# Patient Record
Sex: Male | Born: 1950 | Race: White | Hispanic: No | Marital: Married
Health system: Midwestern US, Community
[De-identification: ages and names within clinical notes are randomized; demographics above are authoritative.]

## PROBLEM LIST (undated history)

## (undated) DIAGNOSIS — M1990 Unspecified osteoarthritis, unspecified site: Secondary | ICD-10-CM

## (undated) DIAGNOSIS — Z9889 Other specified postprocedural states: Secondary | ICD-10-CM

## (undated) DIAGNOSIS — E785 Hyperlipidemia, unspecified: Secondary | ICD-10-CM

## (undated) DIAGNOSIS — K219 Gastro-esophageal reflux disease without esophagitis: Secondary | ICD-10-CM

## (undated) DIAGNOSIS — M25471 Effusion, right ankle: Secondary | ICD-10-CM

## (undated) DIAGNOSIS — J42 Unspecified chronic bronchitis: Secondary | ICD-10-CM

## (undated) DIAGNOSIS — I639 Cerebral infarction, unspecified: Secondary | ICD-10-CM

## (undated) DIAGNOSIS — E119 Type 2 diabetes mellitus without complications: Secondary | ICD-10-CM

## (undated) DIAGNOSIS — G2581 Restless legs syndrome: Secondary | ICD-10-CM

## (undated) DIAGNOSIS — R5381 Other malaise: Secondary | ICD-10-CM

## (undated) DIAGNOSIS — D509 Iron deficiency anemia, unspecified: Secondary | ICD-10-CM

## (undated) DIAGNOSIS — M12819 Other specific arthropathies, not elsewhere classified, unspecified shoulder: Secondary | ICD-10-CM

## (undated) DIAGNOSIS — I10 Essential (primary) hypertension: Secondary | ICD-10-CM

## (undated) DIAGNOSIS — I200 Unstable angina: Secondary | ICD-10-CM

## (undated) DIAGNOSIS — R0600 Dyspnea, unspecified: Secondary | ICD-10-CM

## (undated) DIAGNOSIS — Z794 Long term (current) use of insulin: Secondary | ICD-10-CM

## (undated) DIAGNOSIS — R011 Cardiac murmur, unspecified: Secondary | ICD-10-CM

## (undated) DIAGNOSIS — IMO0001 Reserved for inherently not codable concepts without codable children: Secondary | ICD-10-CM

## (undated) DIAGNOSIS — G44209 Tension-type headache, unspecified, not intractable: Secondary | ICD-10-CM

## (undated) DIAGNOSIS — R112 Nausea with vomiting, unspecified: Secondary | ICD-10-CM

## (undated) DIAGNOSIS — R5383 Other fatigue: Secondary | ICD-10-CM

## (undated) DIAGNOSIS — Z8489 Family history of other specified conditions: Secondary | ICD-10-CM

## (undated) DIAGNOSIS — K2971 Gastritis, unspecified, with bleeding: Secondary | ICD-10-CM

## (undated) DIAGNOSIS — E162 Hypoglycemia, unspecified: Secondary | ICD-10-CM

## (undated) DIAGNOSIS — I2510 Atherosclerotic heart disease of native coronary artery without angina pectoris: Secondary | ICD-10-CM

## (undated) DIAGNOSIS — K2270 Barrett's esophagus without dysplasia: Secondary | ICD-10-CM

## (undated) DIAGNOSIS — R413 Other amnesia: Secondary | ICD-10-CM

## (undated) DIAGNOSIS — T8859XA Other complications of anesthesia, initial encounter: Secondary | ICD-10-CM

## (undated) DIAGNOSIS — M25475 Effusion, left foot: Secondary | ICD-10-CM

## (undated) DIAGNOSIS — M25474 Effusion, right foot: Secondary | ICD-10-CM

## (undated) DIAGNOSIS — N529 Male erectile dysfunction, unspecified: Secondary | ICD-10-CM

## (undated) DIAGNOSIS — M542 Cervicalgia: Secondary | ICD-10-CM

## (undated) DIAGNOSIS — T4145XA Adverse effect of unspecified anesthetic, initial encounter: Secondary | ICD-10-CM

## (undated) DIAGNOSIS — M25472 Effusion, left ankle: Secondary | ICD-10-CM

## (undated) DIAGNOSIS — K2991 Gastroduodenitis, unspecified, with bleeding: Secondary | ICD-10-CM

## (undated) HISTORY — DX: Other malaise: R53.83

## (undated) HISTORY — DX: Other malaise: R53.81

## (undated) HISTORY — PX: UMBILICAL HERNIA REPAIR: SHX196

## (undated) HISTORY — PX: KNEE SURGERY: SHX244

## (undated) HISTORY — DX: Other specific arthropathies, not elsewhere classified, unspecified shoulder: M12.819

## (undated) HISTORY — DX: Unstable angina: I20.0

## (undated) HISTORY — DX: Barrett's esophagus without dysplasia: K22.70

## (undated) HISTORY — PX: ROTATOR CUFF REPAIR: SHX139

## (undated) HISTORY — DX: Tension-type headache, unspecified, not intractable: G44.209

## (undated) HISTORY — PX: MOUTH SURGERY: SHX715

## (undated) HISTORY — DX: Hyperlipidemia, unspecified: E78.5

## (undated) HISTORY — PX: CORONARY ANGIOPLASTY WITH STENT PLACEMENT: SHX49

## (undated) HISTORY — DX: Cervicalgia: M54.2

## (undated) HISTORY — DX: Male erectile dysfunction, unspecified: N52.9

## (undated) HISTORY — DX: Gastroduodenitis, unspecified, with bleeding: K29.91

## (undated) HISTORY — DX: Hypoglycemia, unspecified: E16.2

## (undated) HISTORY — DX: Essential (primary) hypertension: I10

## (undated) HISTORY — DX: Gastritis, unspecified, with bleeding: K29.71

---

## 1998-10-30 ENCOUNTER — Ambulatory Visit (HOSPITAL_BASED_OUTPATIENT_CLINIC_OR_DEPARTMENT_OTHER): Admission: RE | Admit: 1998-10-30 | Discharge: 1998-10-30 | Payer: Self-pay | Admitting: Orthopedic Surgery

## 1999-06-14 ENCOUNTER — Emergency Department (HOSPITAL_COMMUNITY): Admission: EM | Admit: 1999-06-14 | Discharge: 1999-06-14 | Payer: Self-pay | Admitting: Emergency Medicine

## 1999-06-21 ENCOUNTER — Emergency Department (HOSPITAL_COMMUNITY): Admission: EM | Admit: 1999-06-21 | Discharge: 1999-06-21 | Payer: Self-pay | Admitting: Emergency Medicine

## 2007-05-03 HISTORY — PX: INCISION AND DRAINAGE ABSCESS: SHX5864

## 2007-06-01 ENCOUNTER — Emergency Department (HOSPITAL_COMMUNITY): Admission: EM | Admit: 2007-06-01 | Discharge: 2007-06-01 | Payer: Self-pay | Admitting: Emergency Medicine

## 2007-06-02 ENCOUNTER — Ambulatory Visit (HOSPITAL_COMMUNITY): Admission: RE | Admit: 2007-06-02 | Discharge: 2007-06-03 | Payer: Self-pay | Admitting: Urology

## 2008-03-04 HISTORY — PX: HERNIA REPAIR: SHX51

## 2008-04-17 ENCOUNTER — Emergency Department (HOSPITAL_COMMUNITY): Admission: EM | Admit: 2008-04-17 | Discharge: 2008-04-17 | Payer: Self-pay | Admitting: Emergency Medicine

## 2008-05-03 ENCOUNTER — Ambulatory Visit (HOSPITAL_COMMUNITY): Admission: RE | Admit: 2008-05-03 | Discharge: 2008-05-03 | Payer: Self-pay | Admitting: *Deleted

## 2008-05-03 ENCOUNTER — Encounter (INDEPENDENT_AMBULATORY_CARE_PROVIDER_SITE_OTHER): Payer: Self-pay | Admitting: *Deleted

## 2008-06-29 ENCOUNTER — Ambulatory Visit (HOSPITAL_COMMUNITY): Admission: RE | Admit: 2008-06-29 | Discharge: 2008-06-29 | Payer: Self-pay | Admitting: *Deleted

## 2008-07-21 ENCOUNTER — Emergency Department (HOSPITAL_COMMUNITY): Admission: EM | Admit: 2008-07-21 | Discharge: 2008-07-21 | Payer: Self-pay | Admitting: Emergency Medicine

## 2009-07-10 ENCOUNTER — Encounter: Payer: Self-pay | Admitting: Internal Medicine

## 2010-01-11 ENCOUNTER — Ambulatory Visit (HOSPITAL_COMMUNITY): Admission: RE | Admit: 2010-01-11 | Discharge: 2010-01-11 | Payer: Self-pay | Admitting: General Surgery

## 2010-02-22 ENCOUNTER — Emergency Department (HOSPITAL_COMMUNITY)
Admission: EM | Admit: 2010-02-22 | Discharge: 2010-02-22 | Payer: Self-pay | Source: Home / Self Care | Admitting: Emergency Medicine

## 2010-05-14 LAB — CBC
HCT: 44 % (ref 39.0–52.0)
MCH: 30.7 pg (ref 26.0–34.0)
MCV: 85.4 fL (ref 78.0–100.0)
RBC: 5.15 MIL/uL (ref 4.22–5.81)
WBC: 6.6 10*3/uL (ref 4.0–10.5)

## 2010-05-14 LAB — COMPREHENSIVE METABOLIC PANEL
Alkaline Phosphatase: 48 U/L (ref 39–117)
BUN: 16 mg/dL (ref 6–23)
Chloride: 105 mEq/L (ref 96–112)
Creatinine, Ser: 1.07 mg/dL (ref 0.4–1.5)
GFR calc non Af Amer: >60 mL/min (ref >60)
Glucose, Bld: 152 mg/dL — ABNORMAL HIGH (ref 70–99)
Potassium: 3.9 mEq/L (ref 3.5–5.1)
Total Bilirubin: 1.3 mg/dL — ABNORMAL HIGH (ref 0.3–1.2)

## 2010-05-14 LAB — LIPASE, BLOOD: Lipase: 36 U/L (ref 11–59)

## 2010-05-14 LAB — DIFFERENTIAL
Basophils Absolute: 0 10*3/uL (ref 0.0–0.1)
Basophils Relative: 0 % (ref 0–1)
Neutro Abs: 4.8 10*3/uL (ref 1.7–7.7)
Neutrophils Relative %: 72 % (ref 43–77)

## 2010-05-15 LAB — GLUCOSE, CAPILLARY: Glucose-Capillary: 98 mg/dL (ref 70–99)

## 2010-05-15 LAB — COMPREHENSIVE METABOLIC PANEL
ALT: 43 U/L (ref 0–53)
AST: 32 U/L (ref 0–37)
Albumin: 3.9 g/dL (ref 3.5–5.2)
Calcium: 9.7 mg/dL (ref 8.4–10.5)
GFR calc Af Amer: >60 mL/min (ref >60)
Glucose, Bld: 148 mg/dL — ABNORMAL HIGH (ref 70–99)
Sodium: 141 mEq/L (ref 135–145)
Total Protein: 7 g/dL (ref 6.0–8.3)

## 2010-05-15 LAB — SURGICAL PCR SCREEN: Staphylococcus aureus: NEGATIVE

## 2010-06-19 LAB — POCT I-STAT, CHEM 8
BUN: 16 mg/dL (ref 6–23)
BUN: 23 mg/dL (ref 6–23)
Calcium, Ion: 0.96 mmol/L — ABNORMAL LOW (ref 1.12–1.32)
Calcium, Ion: 1.13 mmol/L (ref 1.12–1.32)
Creatinine, Ser: 1.1 mg/dL (ref 0.4–1.5)
Creatinine, Ser: 1.2 mg/dL (ref 0.4–1.5)
Glucose, Bld: 163 mg/dL — ABNORMAL HIGH (ref 70–99)
TCO2: 25 mmol/L (ref 0–100)
TCO2: 25 mmol/L (ref 0–100)

## 2010-06-19 LAB — LACTIC ACID, PLASMA: Lactic Acid, Venous: 3.9 mmol/L — ABNORMAL HIGH (ref 0.5–2.2)

## 2010-06-19 LAB — TYPE AND SCREEN
ABO/RH(D): O POS
Antibody Screen: NEGATIVE

## 2010-06-19 LAB — COMPREHENSIVE METABOLIC PANEL
ALT: 37 U/L (ref 0–53)
BUN: 15 mg/dL (ref 6–23)
CO2: 26 mEq/L (ref 19–32)
Calcium: 8.6 mg/dL (ref 8.4–10.5)
Creatinine, Ser: 1.07 mg/dL (ref 0.4–1.5)
GFR calc non Af Amer: >60 mL/min (ref >60)
Glucose, Bld: 161 mg/dL — ABNORMAL HIGH (ref 70–99)

## 2010-06-19 LAB — HEPATIC FUNCTION PANEL
Albumin: 4.1 g/dL (ref 3.5–5.2)
Alkaline Phosphatase: 51 U/L (ref 39–117)
Indirect Bilirubin: 1 mg/dL — ABNORMAL HIGH (ref 0.3–0.9)
Total Protein: 7 g/dL (ref 6.0–8.3)

## 2010-06-19 LAB — URINE MICROSCOPIC-ADD ON

## 2010-06-19 LAB — DIFFERENTIAL
Basophils Absolute: 0 10*3/uL (ref 0.0–0.1)
Eosinophils Relative: 1 % (ref 0–5)
Lymphocytes Relative: 28 % (ref 12–46)
Monocytes Absolute: 0.3 10*3/uL (ref 0.1–1.0)
Monocytes Relative: 6 % (ref 3–12)
Neutro Abs: 3.3 10*3/uL (ref 1.7–7.7)

## 2010-06-19 LAB — URINALYSIS, ROUTINE W REFLEX MICROSCOPIC
Bilirubin Urine: NEGATIVE
Glucose, UA: NEGATIVE mg/dL
Hgb urine dipstick: NEGATIVE
Ketones, ur: NEGATIVE mg/dL
Specific Gravity, Urine: 1.019 (ref 1.005–1.030)
pH: 8.5 — ABNORMAL HIGH (ref 5.0–8.0)

## 2010-06-19 LAB — LIPASE, BLOOD: Lipase: 33 U/L (ref 11–59)

## 2010-06-19 LAB — CBC
HCT: 46.7 % (ref 39.0–52.0)
Hemoglobin: 16.8 g/dL (ref 13.0–17.0)
MCHC: 35.9 g/dL (ref 30.0–36.0)
RDW: 12.5 % (ref 11.5–15.5)
WBC: 5.2 10*3/uL (ref 4.0–10.5)

## 2010-06-19 LAB — APTT: aPTT: 25 seconds (ref 24–37)

## 2010-06-19 LAB — D-DIMER, QUANTITATIVE: D-Dimer, Quant: 0.42 ug/mL-FEU (ref 0.00–0.48)

## 2010-06-19 LAB — POCT CARDIAC MARKERS: CKMB, poc: <1 ng/mL — ABNORMAL LOW (ref 1.0–8.0)

## 2010-06-19 LAB — PROTIME-INR
INR: 1 (ref 0.00–1.49)
Prothrombin Time: 13.1 seconds (ref 11.6–15.2)

## 2010-07-17 NOTE — H&P (Signed)
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  He went to the ER yesterday  where an attempt was made to lance the left groin abscess and very  little pus was obtained.  He was put on doxycycline but he ran a fever  over 102 last night and the pain and swelling have continued and he is  having progressive cellulitis of his left flank.  He is admitted now for  I and D of this lesion in the OR and to be put on IV antibiotics.  He  never had this before.  It started out as a small pimple and it never  did drain.   PAST MEDICAL HISTORY:  He has had only knee surgery in 2000 as his only  operation.  He does not smoke or drink alcohol.  He has been in good general health.  Has a little acid reflux as his  only medical problem.  No heart or lung problems. A 12 point review of  systems otherwise negative.  He was adopted so does not know his family  history.   FAMILY HISTORY:  He has two sons, age 40 and 31 who still live at home,  both unmarried.   MEDICATIONS:  1. Doxycycline 100 mg twice a day.  2. Oxycodone for pain.   ALLERGIES:  HE HAS NO KNOWN ALLERGIES.   PHYSICAL EXAMINATION:  Temperature is 97.3, pulse 74, respirations 18,  blood pressure 120/83.  He is a pleasant, white middle-aged male in no  acute distress.  HEENT:  Clear.  NECK:  Supple.  ABDOMEN:  Soft, but he does have a fluctuant area in the left inguinal  area with cellulitis that now goes all the way up to into his left  flank.  The mark from the cellulitis yesterday was about half as far.  It has spread at least to 12-15 cm from where it was yesterday.  There  was a little area of scab overlying where it was attempted to be lanced  yesterday but there is a fluctuant mass about 6 or 7 cm underneath this.  The testis feels normal, nontender, but there is some cellulitis that  extends down into the left hemiscrotum and onto his penis.  Prostate is  small and benign.  The abdomen is otherwise negative.  EXTREMITIES:  Negative.  No edema.  Good distal pulses.   IMPRESSION:  1. Left groin abscess.  2. Cellulitis.  Recommend I and D of this tonight.  Start IV Unasyn IV      now.  Blood work will be obtained.  Dr. Retta Diones will do the  surgery as he is on call later today.      Courtney Paris, M.D.  Electronically Signed     HMK/MEDQ  D:  06/02/2007  T:  06/02/2007  Job:  [id number]

## 2010-07-17 NOTE — Op Note (Signed)
NAMECHARLY, [name]                  ACCOUNT NO.:  [id number]   MEDICAL RECORD NO.:  [id number]          PATIENT TYPE:  AMB   LOCATION:  ENDO                         FACILITY:  [facility]   PHYSICIAN:  Georgiana Spinner, M.D.    DATE OF BIRTH:  [birth year]   DATE OF PROCEDURE:  05/03/2008  DATE OF DISCHARGE:                               OPERATIVE REPORT   PROCEDURE:  Upper endoscopy with biopsy.   INDICATIONS:  Abdominal pain.   ANESTHESIA:  Fentanyl 50 mcg, Versed 7 mg.   PROCEDURE:  With the patient mildly sedated in the left lateral  decubitus position, the Pentax videoscopic endoscope was inserted in the  mouth, passed under direct vision through the esophagus which appeared  normal until we reached distal esophagus and there were clear-cut  changes of Barrett's seen with fairly extensive reddened mucosa  extending cephalad in the esophagus from the gastric folds.  These were  photographed and multiple biopsies were taken.  We then entered into the  stomach.  Fundus, body, antrum, duodenal bulb, second portion duodenum  were visualized.  From this point the endoscope was slowly withdrawn  taking circumferential views of duodenal mucosa until the endoscope had  been pulled back into the stomach, placed in retroflexion to view the  stomach from below.  An area of erythema in the cardia was seen.  It was  patchy, was mildly reddened but no ulcers were noted.  It was  photographed and it was biopsied.  The endoscope was then straightened  and withdrawn taking circumferential views of remaining gastric and  esophageal mucosa.  The patient's vital signs, pulse oximeter remained  stable.  The patient tolerated procedure well without apparent  complications.   FINDINGS:  What appears to be extensive Barrett's esophagus in the  distalmost esophagus and a patchy area of erythema in the cardia of the  stomach, both biopsied.  Await biopsy report.  The patient will call me  for results and  follow-up with me as an outpatient.           ______________________________  Georgiana Spinner, M.D.     GMO/MEDQ  D:  05/03/2008  T:  05/04/2008  Job:  [id number]   cc:   Dr. Hyacinth Meeker, [facility]

## 2010-07-17 NOTE — Op Note (Signed)
[name], [name]                  ACCOUNT NO.:  [id number]   MEDICAL RECORD NO.:  [id number]          PATIENT TYPE:  AMB   LOCATION:  ENDO                         FACILITY:  [name]   PHYSICIAN:  Georgiana Spinner, M.D.    DATE OF BIRTH:  02-06-1951   DATE OF PROCEDURE:  06/29/2008  DATE OF DISCHARGE:                               OPERATIVE REPORT   PROCEDURE:  Colonoscopy.   INDICATIONS:  Abdominal pain, colon cancer screening.   ANESTHESIA:  Fentanyl 75 mcg, Versed 7.5 mg.   PROCEDURE IN DETAIL:  With the patient mildly sedated in the left  lateral decubitus position, a rectal examination was performed which was  unremarkable.  Prostate felt normal.  Subsequently, the Pentax  videoscopic colonoscope was inserted in the rectum and passed under  direct vision to the cecum identified by ileocecal valve and appendiceal  orifice, both of which were photographed.  From this point, the  colonoscope was slowly withdrawn, taking circumferential views of  colonic mucosa stopping only in the rectum which appeared normal on  direct and showed hemorrhoids on retroflexed view.  The endoscope was  straightened and withdrawn.  The patient's vital signs, pulse oximeter  remained stable.  The patient tolerated procedure well without apparent  complications.   FINDINGS:  Internal hemorrhoids, otherwise unremarkable colonoscopic  examination to the cecum.   PLAN:  Have patient follow up with me as an outpatient.           ______________________________  Georgiana Spinner, M.D.     GMO/MEDQ  D:  06/29/2008  T:  06/29/2008  Job:  [id number]   cc:   Lenon Curt. Chilton Si, M.D.  Fax: [phone]

## 2010-07-17 NOTE — Op Note (Signed)
NAMEHADI, DUBIN                  ACCOUNT NO.:  [id number]   MEDICAL RECORD NO.:  [id number]          PATIENT TYPE:  OIB   LOCATION:  1412                         FACILITY:  [facility]   PHYSICIAN:  Bertram Millard. Dahlstedt, M.D.DATE OF BIRTH:  1950/11/15   DATE OF PROCEDURE:  06/02/2007  DATE OF DISCHARGE:                               OPERATIVE REPORT   PREOPERATIVE DIAGNOSIS:  Left inguinal abscess.   POSTOPERATIVE DIAGNOSIS:  Left inguinal abscess.   PRINCIPAL PROCEDURE:  Incision and drainage of left inguinal abscess.   SURGEON:  Bertram Millard. Dahlstedt, M.D.   ANESTHESIA:  General.   COMPLICATIONS:  None.   BRIEF HISTORY:  A 60 year old male with an enlarging, erythematous,  tender area in his left groin.  He started having symptoms about 2 days  ago.  He had minimal I&D in the emergency room a couple of days ago.  He  was noted to have an enlarging tender area.  He also had cellulitis  going proximally in his flank.  He has had fever and chills.  He  presents at this time for definitive I&D of the left groin abscess.   DESCRIPTION OF PROCEDURE:  The patient had been administered Unasyn 3  grams at 3:00 p.m..  He was identified in the holding area, and taken to  the operating room where general anesthetic was administered.  He was  placed in the supine position on the table.  His left groin was prepped  and draped.  An incision about a centimeter long was made including a  necrotic skin area moving lateral and superiorly.  It was carried down  to the subcutaneous tissues as a stab incision.  Hemostat was used to  break up small loculations quite deep, an approximately 15 mL of pus was  expressed.  This was cultured.  Iodoform dressing was placed.   At this point procedure was terminated.  The patient was awakened and  taken to PACU in stable condition.      Bertram Millard. Dahlstedt, M.D.  Electronically Signed     SMD/MEDQ  D:  06/02/2007  T:  06/03/2007  Job:  [id number]   cc:    Courtney Paris, M.D.  Fax: [phone]

## 2010-11-26 LAB — BASIC METABOLIC PANEL
BUN: 16
Calcium: 8.8
Creatinine, Ser: 1.08
GFR calc non Af Amer: >60
Glucose, Bld: 137 — ABNORMAL HIGH

## 2010-11-26 LAB — CBC
Hemoglobin: 15.1
MCHC: 35.5
MCV: 88.1
RBC: 4.83

## 2010-11-26 LAB — DIFFERENTIAL
Basophils Relative: 1
Eosinophils Absolute: 0
Eosinophils Relative: 0
Monocytes Absolute: 0.7
Monocytes Relative: 5
Neutrophils Relative %: 88 — ABNORMAL HIGH

## 2010-11-26 LAB — URINALYSIS, ROUTINE W REFLEX MICROSCOPIC
Bilirubin Urine: NEGATIVE
Hgb urine dipstick: NEGATIVE
Specific Gravity, Urine: 1.025
Urobilinogen, UA: 1

## 2010-11-26 LAB — ANAEROBIC CULTURE

## 2011-03-11 ENCOUNTER — Encounter: Payer: Self-pay | Admitting: Emergency Medicine

## 2011-03-11 ENCOUNTER — Emergency Department (HOSPITAL_COMMUNITY)
Admission: EM | Admit: 2011-03-11 | Discharge: 2011-03-11 | Disposition: A | Discharge status: Home / Self Care | Payer: Self-pay | Attending: Emergency Medicine | Admitting: Emergency Medicine

## 2011-03-11 DIAGNOSIS — Z79899 Other long term (current) drug therapy: Secondary | ICD-10-CM | POA: Insufficient documentation

## 2011-03-11 DIAGNOSIS — E119 Type 2 diabetes mellitus without complications: Secondary | ICD-10-CM | POA: Insufficient documentation

## 2011-03-11 DIAGNOSIS — R110 Nausea: Secondary | ICD-10-CM | POA: Insufficient documentation

## 2011-03-11 DIAGNOSIS — IMO0001 Reserved for inherently not codable concepts without codable children: Secondary | ICD-10-CM | POA: Insufficient documentation

## 2011-03-11 DIAGNOSIS — K219 Gastro-esophageal reflux disease without esophagitis: Secondary | ICD-10-CM | POA: Insufficient documentation

## 2011-03-11 DIAGNOSIS — G2581 Restless legs syndrome: Secondary | ICD-10-CM | POA: Insufficient documentation

## 2011-03-11 HISTORY — DX: Restless legs syndrome: G25.81

## 2011-03-11 MED ORDER — OXYCODONE-ACETAMINOPHEN 5-325 MG PO TABS
1.0000 | ORAL_TABLET | Freq: Once | ORAL | Status: AC
  Administered 2011-03-11: 1 via ORAL
  Filled 2011-03-11: qty 1

## 2011-03-11 MED ORDER — LORAZEPAM 1 MG PO TABS
1.0000 mg | ORAL_TABLET | Freq: Once | ORAL | Status: AC
  Administered 2011-03-11: 1 mg via ORAL
  Filled 2011-03-11: qty 1

## 2011-03-11 NOTE — ED Notes (Signed)
Pt sts taking pramipexole for RLS and then was changed to carbidopa and has not slept since change and having more trouble with legs

## 2011-03-11 NOTE — ED Provider Notes (Signed)
History     CSN: [id number]  Arrival date & time 03/11/11  1842   First MD Initiated Contact with Patient 03/11/11 2115      Chief Complaint  Patient presents with  . Medication Reaction    HPI  History provided by the patient. Patient is a 61 year old male with history of diabetes and restless leg syndrome who presents with complaints of increased symptoms of restless legs. He reports previously being treated with pramipexole but recently changed to carbidopa 5 days ago. Since that time he states symptoms have not improved significantly at all. Earlier today he tried taking 3 of the carbidopa pills to see if increasing the dosage would help. Patient states instead he began to feel nauseous and a little sick. Patient states that he presents emergently today because he was a little bit worried about perhaps taking too much medicine and also because his symptoms are not improved. He states that he can hardly sit still and feels like he is always constantly up walking. As the evening has come patient states he is feeling a little better and has been able to leg and rest but has been waiting in the emergency room. He does state that he feels some of the symptoms coming back. He hopes to talk to his primary doctor tomorrow about changing medicines. Patient states that he hopes to have some temporary help with symptoms the night. Patient denies any other symptoms.    Past Medical History  Diagnosis Date  . Restless leg   . Reflux   . Diabetes mellitus     History reviewed. No pertinent past surgical history.  History reviewed. No pertinent family history.  History  Substance Use Topics  . Smoking status: Never Smoker   . Smokeless tobacco: Not on file  . Alcohol Use: No      Review of Systems  All other systems reviewed and are negative.    Allergies  Review of patient's allergies indicates no known allergies.  Home Medications   Current Outpatient Rx  Name Route Sig  Dispense Refill  . CARBIDOPA-LEVODOPA 25-100 MG PO TABS Oral Take 1-2 tablets by mouth at bedtime as needed. For restless legs     . ESOMEPRAZOLE MAGNESIUM 20 MG PO CPDR Oral Take 20 mg by mouth daily before breakfast.      . GLYBURIDE 5 MG PO TABS Oral Take 5 mg by mouth daily with breakfast.      . METFORMIN HCL 500 MG PO TABS Oral Take 500 mg by mouth 2 (two) times daily with a meal.        BP 146/98  Pulse 72  Temp(Src) 97.5 F (36.4 C) (Oral)  Resp 18  SpO2 97%  Physical Exam  Nursing note and vitals reviewed. Constitutional: He appears well-developed and well-nourished.  HENT:  Head: Normocephalic.  Cardiovascular: Normal rate and regular rhythm.   Pulmonary/Chest: Effort normal and breath sounds normal.  Abdominal: Soft.  Musculoskeletal: He exhibits no edema and no tenderness.  Neurological: He has normal strength. No sensory deficit. Gait normal.  Skin: Skin is warm and dry. No rash noted.  Psychiatric: He has a normal mood and affect. His behavior is normal.    ED Course  Procedures     1. Restless leg       MDM  9:15PM patient seen and evaluated. Patient in no acute distress.        Angus Seller, [place] 03/12/11 [phone]

## 2011-03-13 NOTE — ED Provider Notes (Signed)
Medical screening examination/treatment/procedure(s) were performed by non-physician practitioner and as supervising physician I was immediately available for consultation/collaboration.   Suzi Roots, MD 03/13/11 [phone]

## 2012-05-29 ENCOUNTER — Other Ambulatory Visit: Payer: Self-pay | Admitting: *Deleted

## 2012-05-29 DIAGNOSIS — G44209 Tension-type headache, unspecified, not intractable: Secondary | ICD-10-CM

## 2012-05-29 DIAGNOSIS — I10 Essential (primary) hypertension: Secondary | ICD-10-CM

## 2012-05-29 DIAGNOSIS — IMO0001 Reserved for inherently not codable concepts without codable children: Secondary | ICD-10-CM

## 2012-06-12 ENCOUNTER — Other Ambulatory Visit: Payer: Self-pay

## 2012-06-12 DIAGNOSIS — G44209 Tension-type headache, unspecified, not intractable: Secondary | ICD-10-CM

## 2012-06-12 DIAGNOSIS — I10 Essential (primary) hypertension: Secondary | ICD-10-CM

## 2012-06-12 DIAGNOSIS — IMO0001 Reserved for inherently not codable concepts without codable children: Secondary | ICD-10-CM

## 2012-06-13 LAB — BASIC METABOLIC PANEL
Creatinine, Ser: 1.03 mg/dL (ref 0.76–1.27)
GFR calc Af Amer: 90 mL/min/{1.73_m2} (ref >59)
GFR calc non Af Amer: 77 mL/min/{1.73_m2} (ref >59)
Potassium: 4.1 mmol/L (ref 3.5–5.2)
Sodium: 139 mmol/L (ref 134–144)

## 2012-06-13 LAB — LIPASE: Lipase: 34 U/L (ref 0–59)

## 2012-06-16 ENCOUNTER — Encounter: Payer: Self-pay | Admitting: Internal Medicine

## 2012-06-16 ENCOUNTER — Ambulatory Visit (INDEPENDENT_AMBULATORY_CARE_PROVIDER_SITE_OTHER): Payer: Self-pay | Admitting: Internal Medicine

## 2012-06-16 VITALS — BP 118/82 | HR 80 | Temp 96.6°F | Resp 14 | Ht >= 80 in | Wt 169.2 lb

## 2012-06-16 DIAGNOSIS — E1159 Type 2 diabetes mellitus with other circulatory complications: Secondary | ICD-10-CM | POA: Insufficient documentation

## 2012-06-16 DIAGNOSIS — K429 Umbilical hernia without obstruction or gangrene: Secondary | ICD-10-CM | POA: Insufficient documentation

## 2012-06-16 DIAGNOSIS — G44209 Tension-type headache, unspecified, not intractable: Secondary | ICD-10-CM

## 2012-06-16 DIAGNOSIS — R39198 Other difficulties with micturition: Secondary | ICD-10-CM | POA: Insufficient documentation

## 2012-06-16 DIAGNOSIS — J411 Mucopurulent chronic bronchitis: Secondary | ICD-10-CM | POA: Insufficient documentation

## 2012-06-16 DIAGNOSIS — M25569 Pain in unspecified knee: Secondary | ICD-10-CM | POA: Insufficient documentation

## 2012-06-16 DIAGNOSIS — R634 Abnormal weight loss: Secondary | ICD-10-CM | POA: Insufficient documentation

## 2012-06-16 DIAGNOSIS — K2971 Gastritis, unspecified, with bleeding: Secondary | ICD-10-CM | POA: Insufficient documentation

## 2012-06-16 DIAGNOSIS — IMO0002 Reserved for concepts with insufficient information to code with codable children: Secondary | ICD-10-CM | POA: Insufficient documentation

## 2012-06-16 DIAGNOSIS — I10 Essential (primary) hypertension: Secondary | ICD-10-CM

## 2012-06-16 DIAGNOSIS — E162 Hypoglycemia, unspecified: Secondary | ICD-10-CM | POA: Insufficient documentation

## 2012-06-16 DIAGNOSIS — J209 Acute bronchitis, unspecified: Secondary | ICD-10-CM

## 2012-06-16 DIAGNOSIS — M542 Cervicalgia: Secondary | ICD-10-CM | POA: Insufficient documentation

## 2012-06-16 DIAGNOSIS — R5381 Other malaise: Secondary | ICD-10-CM | POA: Insufficient documentation

## 2012-06-16 DIAGNOSIS — R5383 Other fatigue: Secondary | ICD-10-CM | POA: Insufficient documentation

## 2012-06-16 DIAGNOSIS — E1151 Type 2 diabetes mellitus with diabetic peripheral angiopathy without gangrene: Secondary | ICD-10-CM | POA: Insufficient documentation

## 2012-06-16 DIAGNOSIS — K8020 Calculus of gallbladder without cholecystitis without obstruction: Secondary | ICD-10-CM | POA: Insufficient documentation

## 2012-06-16 DIAGNOSIS — IMO0001 Reserved for inherently not codable concepts without codable children: Secondary | ICD-10-CM

## 2012-06-16 DIAGNOSIS — G2581 Restless legs syndrome: Secondary | ICD-10-CM | POA: Insufficient documentation

## 2012-06-16 DIAGNOSIS — K2270 Barrett's esophagus without dysplasia: Secondary | ICD-10-CM | POA: Insufficient documentation

## 2012-06-16 DIAGNOSIS — R1013 Epigastric pain: Secondary | ICD-10-CM | POA: Insufficient documentation

## 2012-06-16 DIAGNOSIS — E785 Hyperlipidemia, unspecified: Secondary | ICD-10-CM | POA: Insufficient documentation

## 2012-06-16 DIAGNOSIS — N529 Male erectile dysfunction, unspecified: Secondary | ICD-10-CM | POA: Insufficient documentation

## 2012-06-16 MED ORDER — DOXYCYCLINE HYCLATE 100 MG PO TABS: ORAL_TABLET | ORAL | Status: DC

## 2012-06-16 MED ORDER — PHENIRAMINE-PE-APAP 20-10-325 MG PO PACK: PACK | ORAL | Status: DC

## 2012-06-16 NOTE — Patient Instructions (Signed)
See Clinical Pharmacologist as scheduled for instruction in use of insulin.

## 2012-06-22 ENCOUNTER — Encounter: Payer: Self-pay | Admitting: Pharmacotherapy

## 2012-06-22 ENCOUNTER — Ambulatory Visit (INDEPENDENT_AMBULATORY_CARE_PROVIDER_SITE_OTHER): Payer: Self-pay | Admitting: Pharmacotherapy

## 2012-06-22 VITALS — BP 110/82 | HR 58 | Temp 97.0°F | Resp 16 | Ht 66.5 in | Wt 170.6 lb

## 2012-06-22 DIAGNOSIS — I10 Essential (primary) hypertension: Secondary | ICD-10-CM

## 2012-06-22 DIAGNOSIS — IMO0001 Reserved for inherently not codable concepts without codable children: Secondary | ICD-10-CM

## 2012-06-22 MED ORDER — INSULIN GLARGINE 100 UNIT/ML ~~LOC~~ SOLN: SUBCUTANEOUS | Status: DC

## 2012-06-22 MED ORDER — BD PEN NEEDLE NANO U/F 32G X 4 MM MISC: Status: DC

## 2012-06-22 NOTE — Progress Notes (Signed)
  Subjective:    [name] is a 62 y.o. male who presents for follow-up of Type 2 diabetes mellitus.   He has been referred by [name] to start insulin therapy. A1C 8.3%  Did not bring meter to visit. He reports his BG is 180-200.  He usually checks a fasting BG.   He is currently is taking Invokana.  He is staying well hydrated.  He does have polyuria and polydipsia. No symptoms of UTI.  He has had a lifetime history of hypoglycemia He has a difficult time with eating healthy. Not much exercise. Denies problems with feet. Denies problems with vision.  Eye exam is due.  Last eye exam was March 2013. Nocturia at times. No peripheral edema.   Review of Systems A comprehensive review of systems was negative except for: Genitourinary: positive for nocturia Endocrine: positive for diabetic symptoms including polydipsia, polyuria and weight loss    Objective:    BP 110/82  Pulse 58  Temp(Src) 97 F (36.1 C)  Resp 16  Ht 5' 6.5" (1.689 m)  Wt 170 lb 9.6 oz (77.384 kg)  BMI 27.13 kg/m2  General:  alert, cooperative and no distress  Oropharynx: normal findings: lips normal without lesions and gums healthy   Eyes:  negative findings: lids and lashes normal, conjunctivae and sclerae normal and corneas clear   Ears:  external ears normal        Lung: clear to auscultation bilaterally  Heart:  regular rate and rhythm     Extremities: no edema  Skin: dry and warm     Neuro: mental status, speech normal, alert and oriented x3 and gait and station normal   Lab Review Glucose (mg/dL)  Date Value  9/60/4540 179*     Glucose, Bld (mg/dL)  Date Value  98/01/9146 152*  01/09/2010 148*  04/17/2008 161*     CO2 (mmol/L)  Date Value  06/12/2012 21   02/22/2010 22   01/09/2010 26      BUN (mg/dL)  Date Value  10/31/5619 18   02/22/2010 16   01/09/2010 18   04/17/2008 15      Creatinine, Ser (mg/dL)  Date Value  05/09/6576 1.03   02/22/2010 1.07   01/09/2010 1.21       06/12/12: A1C:  8.3%   Assessment:    Diabetes Mellitus type II, under poor control.   BP goal <140/80   Plan:    1.  Rx changes: start Lantus 10 units once daily.  Taught patient to use Solostar pen.  Patient was able to demonstrate good technique with demo pen. 2.  Continue Invokana 300mg  daily. 3.  Counseled on hypoglycemia. 4.  Counseled on meal planning and serving size.  Provided written information to supplement education. 5.  Needs eye exam. 6.  BP at goal.  Continue current RX. 7.  RTC in one month.

## 2012-07-12 NOTE — Progress Notes (Signed)
Subjective:    Patient ID: [name], male    DOB: Jun 24, 1950, 62 y.o.   MRN: [id number]  HPI Patient feels that his abdominal pain is doing better off metformin. Blood sugars, however remain uncontrolled. He has been started on Invokana. Sugars still continue to range 164-180.  Patient previously had had a number of headaches. They seem to be better since he was placed on Invokana.  Blood pressure is under some sphincter control.  Abdominal and epigastric discomfort is improved but persists with a slight degree.  Previous acute bronchitis has improved.   Review of Systems  Constitutional: Positive for fatigue. Negative for fever.  HENT: Positive for hearing loss. Negative for ear pain, neck pain and neck stiffness.   Eyes: Negative.        Wears prescription lenses.  Respiratory: Negative for cough and shortness of breath.   Cardiovascular: Negative for chest pain, palpitations and leg swelling.  Endocrine: Negative for cold intolerance, heat intolerance, polydipsia, polyphagia and polyuria.       Diabetes under poor control.  Genitourinary: Negative.   Musculoskeletal: Positive for myalgias, back pain, joint swelling, arthralgias and gait problem.  Skin: Negative.   Allergic/Immunologic: Negative.   Neurological: Positive for weakness and headaches. Negative for dizziness, tremors, seizures, syncope, facial asymmetry, speech difficulty, light-headedness and numbness.  Hematological: Negative.   Psychiatric/Behavioral: The patient is nervous/anxious.        Objective:   Physical Exam  Constitutional: He is oriented to person, place, and time. He appears well-developed and well-nourished. No distress.  Significantly overweight.  HENT:  Head: Normocephalic and atraumatic.  Right Ear: External ear normal.  Left Ear: External ear normal.  Nose: Nose normal.  Mouth/Throat: Oropharynx is clear and moist.  Eyes: Conjunctivae and EOM are normal. Pupils are equal, round, and  reactive to light.  Neck: Normal range of motion. Neck supple. No JVD present. No tracheal deviation present. No thyromegaly present.  Cardiovascular: Normal rate, regular rhythm, normal heart sounds and intact distal pulses.  Exam reveals no gallop and no friction rub.   No murmur heard. Pulmonary/Chest: Effort normal. No respiratory distress. He has no wheezes. He has no rales. He exhibits no tenderness.  Abdominal: Bowel sounds are normal. He exhibits no distension and no mass.  Musculoskeletal: Normal range of motion. He exhibits no edema and no tenderness.  Lymphadenopathy:    He has no cervical adenopathy.  Neurological: He is alert and oriented to person, place, and time. He has normal reflexes. No cranial nerve deficit. Coordination normal.  Skin: Skin is warm and dry. No rash noted. No erythema. No pallor.  Psychiatric: He has a normal mood and affect. His behavior is normal. Judgment and thought content normal.   Appointment on 06/12/2012  Component Date Value Range Status  . Lipase 06/12/2012 34  0 - 59 U/L Final  . Amylase 06/12/2012 69  31 - 124 U/L Final  . Hemoglobin A1C 06/12/2012 8.3* 4.8 - 5.6 % Final   Comment:          Increased risk for diabetes: 5.7 - 6.4                                   Diabetes: >6.4  Glycemic control for adults with diabetes: <7.0  . Estimated average glucose 06/12/2012 192   Final  . Glucose 06/12/2012 179* 65 - 99 mg/dL Final  . BUN 08/65/7846 18  8 - 27 mg/dL Final  . Creatinine, Ser 06/12/2012 1.03  0.76 - 1.27 mg/dL Final  . GFR calc non Af Amer 06/12/2012 77  >59 mL/min/1.73 Final  . GFR calc Af Amer 06/12/2012 90  >59 mL/min/1.73 Final  . BUN/Creatinine Ratio 06/12/2012 17  10 - 22 Final  . Sodium 06/12/2012 139  134 - 144 mmol/L Final  . Potassium 06/12/2012 4.1  3.5 - 5.2 mmol/L Final  . Chloride 06/12/2012 101  97 - 108 mmol/L Final  . CO2 06/12/2012 21  19 - 28 mmol/L Final  . Calcium 06/12/2012 9.7   8.6 - 10.2 mg/dL Final              Assessment & Plan:  1. Type II or unspecified type diabetes mellitus without mention of complication, uncontrolled Poor control on Invokana. I recommend that he start insulin. He accepts this reluctantly. Referral will be made to our clinical pharmacologist for further instruction. - Hemoglobin A1c; Future - Basic Metabolic Panel; Future  2. Acute bronchitis Improved  3. Barrett's esophagus Chronic issue that currently is without symptomatology  4. Abdominal pain, epigastric Etiology uncertain. Improved.  5. Cholelithiasis There is a possibility this could contribute to his upper abdominal pain. Liver enzymes appear to be normal.  6. Unspecified essential hypertension Stable on current medications  7. Tension headache Improved since discontinuation of metformin.

## 2012-07-20 ENCOUNTER — Ambulatory Visit: Payer: Self-pay | Admitting: Pharmacotherapy

## 2012-08-17 ENCOUNTER — Ambulatory Visit: Payer: Self-pay | Admitting: Pharmacotherapy

## 2012-09-07 ENCOUNTER — Ambulatory Visit: Payer: Self-pay | Admitting: Pharmacotherapy

## 2012-09-10 ENCOUNTER — Other Ambulatory Visit: Payer: Self-pay | Admitting: Geriatric Medicine

## 2012-09-10 MED ORDER — PRAMIPEXOLE DIHYDROCHLORIDE ER 0.75 MG PO TB24: ORAL_TABLET | ORAL | Status: DC

## 2012-09-17 ENCOUNTER — Other Ambulatory Visit: Payer: Self-pay

## 2012-09-22 ENCOUNTER — Ambulatory Visit: Payer: Self-pay | Admitting: Internal Medicine

## 2012-10-01 ENCOUNTER — Other Ambulatory Visit: Payer: Self-pay | Admitting: Geriatric Medicine

## 2012-10-01 MED ORDER — PRAMIPEXOLE DIHYDROCHLORIDE ER 0.75 MG PO TB24: ORAL_TABLET | ORAL | Status: DC

## 2012-10-02 ENCOUNTER — Other Ambulatory Visit: Payer: Self-pay | Admitting: Geriatric Medicine

## 2012-10-05 ENCOUNTER — Other Ambulatory Visit: Payer: BC Managed Care – PPO

## 2012-10-05 DIAGNOSIS — IMO0001 Reserved for inherently not codable concepts without codable children: Secondary | ICD-10-CM

## 2012-10-06 LAB — HEMOGLOBIN A1C: Hgb A1c MFr Bld: 8.5 % — ABNORMAL HIGH (ref 4.8–5.6)

## 2012-10-06 LAB — BASIC METABOLIC PANEL
BUN/Creatinine Ratio: 15 (ref 10–22)
CO2: 24 mmol/L (ref 18–29)
Chloride: 102 mmol/L (ref 97–108)
GFR calc Af Amer: 86 mL/min/{1.73_m2} (ref >59)
Glucose: 160 mg/dL — ABNORMAL HIGH (ref 65–99)
Potassium: 4.3 mmol/L (ref 3.5–5.2)

## 2012-10-07 ENCOUNTER — Encounter: Payer: Self-pay | Admitting: Internal Medicine

## 2012-10-07 ENCOUNTER — Ambulatory Visit (INDEPENDENT_AMBULATORY_CARE_PROVIDER_SITE_OTHER): Payer: BC Managed Care – PPO | Admitting: Internal Medicine

## 2012-10-07 VITALS — BP 122/80 | HR 72 | Temp 97.9°F | Resp 18 | Wt 173.6 lb

## 2012-10-07 DIAGNOSIS — E1059 Type 1 diabetes mellitus with other circulatory complications: Secondary | ICD-10-CM

## 2012-10-07 DIAGNOSIS — R05 Cough: Secondary | ICD-10-CM | POA: Insufficient documentation

## 2012-10-07 DIAGNOSIS — R059 Cough, unspecified: Secondary | ICD-10-CM

## 2012-10-07 MED ORDER — PRAMIPEXOLE DIHYDROCHLORIDE ER 0.75 MG PO TB24: ORAL_TABLET | ORAL | Status: DC

## 2012-10-07 NOTE — Patient Instructions (Signed)
Check glucose level each morning. Goal is to get it less than 120 on most mornings. Increase Lantus by 3 units on a weekly basis until you reach goal. Stop Invokana.

## 2012-10-07 NOTE — Progress Notes (Signed)
Subjective:    Patient ID: [name], male    DOB: [birth year], 62 y.o.   MRN: [id number]  HPI Stomach s hurting again. No identified trigger factors. Pain i the epigastrium. Never hurts at night.  Takes omeprazole daily. Previously thought metformin caused this symptom. has been to the hospital twice for a similar, but more severe , pain. Last time was a couple of years ago. Current pains last 5-10 min and then they go away. Denies dysphagia. Sometimes food will hang in the throat. Sometimes he will cough up the food. Sometimes has coughing spells.  Home CBG is usually >180. No low glucose values. He is not following a good diet. He is gaining weight.  Current Outpatient Prescriptions on File Prior to Visit  Medication Sig Dispense Refill  . BD PEN NEEDLE NANO U/F 32G X 4 MM MISC Use one needle daily with Lantus pen  100 each  12  . Canagliflozin (INVOKANA) 300 MG TABS Take by mouth. Take one tablet once a day to control diabetes      . insulin glargine (LANTUS SOLOSTAR) 100 UNIT/ML injection Inject 10 units once daily and increase as directed  5 pen  PRN  . insulin glargine (LANTUS SOLOSTAR) 100 UNIT/ML injection Inject 10 units once daily and increase as directed  5 pen  PRN  . omeprazole (PRILOSEC) 20 MG capsule Take 20 mg by mouth daily. Take one tablet once a day for acid reflux      . Pramipexole Dihydrochloride 0.75 MG TB24 Take 1/2 tablet every night for restless leg  30 tablet  0   No current facility-administered medications on file prior to visit.    Review of Systems  Constitutional: Positive for fatigue. Negative for fever.  HENT: Positive for hearing loss. Negative for ear pain, neck pain and neck stiffness.   Eyes: Negative.        Wears prescription lenses.  Respiratory: Negative for cough and shortness of breath.   Cardiovascular: Negative for chest pain, palpitations and leg swelling.  Endocrine: Negative for cold intolerance, heat intolerance, polydipsia, polyphagia  and polyuria.       Diabetes under poor control.  Genitourinary: Negative.   Musculoskeletal: Positive for myalgias, back pain, joint swelling, arthralgias and gait problem.  Skin: Negative.   Allergic/Immunologic: Negative.   Neurological: Positive for weakness and headaches. Negative for dizziness, tremors, seizures, syncope, facial asymmetry, speech difficulty, light-headedness and numbness.  Hematological: Negative.   Psychiatric/Behavioral: The patient is nervous/anxious.        Objective:BP 122/80  Pulse 72  Temp(Src) 97.9 F (36.6 C) (Oral)  Resp 18  Wt 173 lb 9.6 oz (78.744 kg)  BMI 27.6 kg/m2  SpO2 95%    Physical Exam  Constitutional: He is oriented to person, place, and time. He appears well-developed and well-nourished. No distress.  Significantly overweight.  HENT:  Head: Normocephalic and atraumatic.  Right Ear: External ear normal.  Left Ear: External ear normal.  Nose: Nose normal.  Mouth/Throat: Oropharynx is clear and moist.  Eyes: Conjunctivae and EOM are normal. Pupils are equal, round, and reactive to light.  Neck: Normal range of motion. Neck supple. No JVD present. No tracheal deviation present. No thyromegaly present.  Cardiovascular: Normal rate, regular rhythm, normal heart sounds and intact distal pulses.  Exam reveals no gallop and no friction rub.   No murmur heard. Pulmonary/Chest: Effort normal. No respiratory distress. He has no wheezes. He has no rales. He exhibits no tenderness.  Abdominal: Bowel  sounds are normal. He exhibits no distension and no mass.  Musculoskeletal: Normal range of motion. He exhibits no edema and no tenderness.  Lymphadenopathy:    He has no cervical adenopathy.  Neurological: He is alert and oriented to person, place, and time. He has normal reflexes. No cranial nerve deficit. Coordination normal.  Skin: Skin is warm and dry. No rash noted. No erythema. No pallor.  Psychiatric: He has a normal mood and affect. His  behavior is normal. Judgment and thought content normal.      Appointment on 10/05/2012  Component Date Value Range Status  . Hemoglobin A1C 10/05/2012 8.5* 4.8 - 5.6 % Final   Comment:          Increased risk for diabetes: 5.7 - 6.4                                   Diabetes: >6.4                                   Glycemic control for adults with diabetes: <7.0  . Estimated average glucose 10/05/2012 197   Final  . Glucose 10/05/2012 160* 65 - 99 mg/dL Final  . BUN 95/62/1308 16  8 - 27 mg/dL Final  . Creatinine, Ser 10/05/2012 1.06  0.76 - 1.27 mg/dL Final  . GFR calc non Af Amer 10/05/2012 74  >59 mL/min/1.73 Final  . GFR calc Af Amer 10/05/2012 86  >59 mL/min/1.73 Final  . BUN/Creatinine Ratio 10/05/2012 15  10 - 22 Final  . Sodium 10/05/2012 141  134 - 144 mmol/L Final  . Potassium 10/05/2012 4.3  3.5 - 5.2 mmol/L Final  . Chloride 10/05/2012 102  97 - 108 mmol/L Final  . CO2 10/05/2012 24  18 - 29 mmol/L Final  . Calcium 10/05/2012 9.6  8.6 - 10.2 mg/dL Final       Assessment & Plan:  Type I (juvenile type) diabetes mellitus with peripheral circulatory disorders, not stated as uncontrolled(250.71): poor control. Advised to improve daiet, lose weight and increase Lantus by 3 units weekly until CBG is lower than 120 most mornings. He will also stop Invokana   - Plan: Hemoglobin A1c, Basic metabolic panel  Cough - Plan: SLP modified barium swallow

## 2012-10-08 ENCOUNTER — Other Ambulatory Visit: Payer: Self-pay | Admitting: Geriatric Medicine

## 2012-10-08 MED ORDER — PRAMIPEXOLE DIHYDROCHLORIDE ER 0.75 MG PO TB24: ORAL_TABLET | ORAL | Status: DC

## 2012-10-09 ENCOUNTER — Other Ambulatory Visit: Payer: Self-pay | Admitting: Geriatric Medicine

## 2012-10-09 MED ORDER — PRAMIPEXOLE DIHYDROCHLORIDE ER 0.75 MG PO TB24: ORAL_TABLET | ORAL | Status: DC

## 2012-10-30 ENCOUNTER — Other Ambulatory Visit: Payer: Self-pay | Admitting: *Deleted

## 2012-10-30 ENCOUNTER — Other Ambulatory Visit: Payer: Self-pay | Admitting: Internal Medicine

## 2012-10-30 MED ORDER — PRAMIPEXOLE DIHYDROCHLORIDE 0.75 MG PO TABS: ORAL_TABLET | ORAL | Status: DC

## 2012-10-30 MED ORDER — AMBULATORY NON FORMULARY MEDICATION: Status: DC

## 2012-11-05 ENCOUNTER — Other Ambulatory Visit: Payer: Self-pay | Admitting: Internal Medicine

## 2012-11-11 ENCOUNTER — Encounter (HOSPITAL_COMMUNITY): Payer: Self-pay | Admitting: Emergency Medicine

## 2012-11-11 ENCOUNTER — Observation Stay (HOSPITAL_COMMUNITY)
Admission: EM | Admit: 2012-11-11 | Discharge: 2012-11-12 | Disposition: A | Discharge status: Home / Self Care | Payer: BC Managed Care – PPO | Attending: Internal Medicine | Admitting: Internal Medicine

## 2012-11-11 ENCOUNTER — Emergency Department (HOSPITAL_COMMUNITY): Payer: BC Managed Care – PPO

## 2012-11-11 DIAGNOSIS — E1159 Type 2 diabetes mellitus with other circulatory complications: Secondary | ICD-10-CM | POA: Diagnosis present

## 2012-11-11 DIAGNOSIS — K2270 Barrett's esophagus without dysplasia: Secondary | ICD-10-CM | POA: Diagnosis present

## 2012-11-11 DIAGNOSIS — E119 Type 2 diabetes mellitus without complications: Secondary | ICD-10-CM | POA: Insufficient documentation

## 2012-11-11 DIAGNOSIS — IMO0002 Reserved for concepts with insufficient information to code with codable children: Secondary | ICD-10-CM | POA: Diagnosis present

## 2012-11-11 DIAGNOSIS — IMO0001 Reserved for inherently not codable concepts without codable children: Secondary | ICD-10-CM

## 2012-11-11 DIAGNOSIS — R079 Chest pain, unspecified: Secondary | ICD-10-CM | POA: Diagnosis present

## 2012-11-11 DIAGNOSIS — G2581 Restless legs syndrome: Secondary | ICD-10-CM | POA: Diagnosis present

## 2012-11-11 DIAGNOSIS — R0789 Other chest pain: Principal | ICD-10-CM | POA: Insufficient documentation

## 2012-11-11 DIAGNOSIS — K219 Gastro-esophageal reflux disease without esophagitis: Secondary | ICD-10-CM

## 2012-11-11 HISTORY — DX: Gastro-esophageal reflux disease without esophagitis: K21.9

## 2012-11-11 HISTORY — DX: Nausea with vomiting, unspecified: R11.2

## 2012-11-11 HISTORY — DX: Other complications of anesthesia, initial encounter: T88.59XA

## 2012-11-11 HISTORY — DX: Other specified postprocedural states: Z98.890

## 2012-11-11 HISTORY — DX: Adverse effect of unspecified anesthetic, initial encounter: T41.45XA

## 2012-11-11 LAB — TROPONIN I: Troponin I: <0.3 ng/mL (ref <0.30)

## 2012-11-11 LAB — CBC
HCT: 42.1 % (ref 39.0–52.0)
Hemoglobin: 15.5 g/dL (ref 13.0–17.0)
MCH: 30.8 pg (ref 26.0–34.0)
MCV: 84.7 fL (ref 78.0–100.0)
RBC: 5.03 MIL/uL (ref 4.22–5.81)
RDW: 12.6 % (ref 11.5–15.5)
WBC: 5 10*3/uL (ref 4.0–10.5)

## 2012-11-11 LAB — COMPREHENSIVE METABOLIC PANEL
ALT: 32 U/L (ref 0–53)
Alkaline Phosphatase: 49 U/L (ref 39–117)
CO2: 26 mEq/L (ref 19–32)
Calcium: 9.3 mg/dL (ref 8.4–10.5)
GFR calc Af Amer: >90 mL/min (ref >90)
GFR calc non Af Amer: 86 mL/min — ABNORMAL LOW (ref >90)
Glucose, Bld: 238 mg/dL — ABNORMAL HIGH (ref 70–99)
Sodium: 132 mEq/L — ABNORMAL LOW (ref 135–145)

## 2012-11-11 LAB — POCT I-STAT TROPONIN I

## 2012-11-11 LAB — GLUCOSE, CAPILLARY
Glucose-Capillary: 95 mg/dL (ref 70–99)
Glucose-Capillary: 142 mg/dL — ABNORMAL HIGH (ref 70–99)

## 2012-11-11 LAB — MRSA PCR SCREENING: MRSA by PCR: NEGATIVE

## 2012-11-11 LAB — CREATININE, SERUM: GFR calc Af Amer: >90 mL/min (ref >90)

## 2012-11-11 MED ORDER — MORPHINE SULFATE 2 MG/ML IJ SOLN: 2.0000 mg | INTRAMUSCULAR | Status: DC | PRN

## 2012-11-11 MED ORDER — ACETAMINOPHEN 650 MG RE SUPP: 650.0000 mg | Freq: Four times a day (QID) | RECTAL | Status: DC | PRN

## 2012-11-11 MED ORDER — PRAMIPEXOLE DIHYDROCHLORIDE 0.125 MG PO TABS
0.3750 mg | ORAL_TABLET | Freq: Every day | ORAL | Status: DC
  Administered 2012-11-11: 0.375 mg via ORAL
  Filled 2012-11-11 (×2): qty 6

## 2012-11-11 MED ORDER — SODIUM CHLORIDE 0.9 % IV SOLN: 250.0000 mL | INTRAVENOUS | Status: DC | PRN

## 2012-11-11 MED ORDER — ONDANSETRON HCL 4 MG/2ML IJ SOLN: 4.0000 mg | Freq: Four times a day (QID) | INTRAMUSCULAR | Status: DC | PRN

## 2012-11-11 MED ORDER — PRAMIPEXOLE DIHYDROCHLORIDE 0.75 MG PO TABS
0.3750 mg | ORAL_TABLET | Freq: Every day | ORAL | Status: DC
  Filled 2012-11-11: qty 1

## 2012-11-11 MED ORDER — SODIUM CHLORIDE 0.9 % IJ SOLN: 3.0000 mL | Freq: Two times a day (BID) | INTRAMUSCULAR | Status: DC

## 2012-11-11 MED ORDER — ACETAMINOPHEN 325 MG PO TABS
650.0000 mg | ORAL_TABLET | Freq: Four times a day (QID) | ORAL | Status: DC | PRN
  Administered 2012-11-12: 650 mg via ORAL
  Filled 2012-11-11: qty 2

## 2012-11-11 MED ORDER — INSULIN ASPART 100 UNIT/ML ~~LOC~~ SOLN
0.0000 [IU] | Freq: Three times a day (TID) | SUBCUTANEOUS | Status: DC
  Administered 2012-11-12: 2 [IU] via SUBCUTANEOUS
  Administered 2012-11-12: 3 [IU] via SUBCUTANEOUS

## 2012-11-11 MED ORDER — HYDROCODONE-ACETAMINOPHEN 5-325 MG PO TABS: 1.0000 | ORAL_TABLET | ORAL | Status: DC | PRN

## 2012-11-11 MED ORDER — ASPIRIN EC 325 MG PO TBEC
325.0000 mg | DELAYED_RELEASE_TABLET | Freq: Every day | ORAL | Status: DC
  Administered 2012-11-12: 325 mg via ORAL
  Filled 2012-11-11: qty 1

## 2012-11-11 MED ORDER — ALUM & MAG HYDROXIDE-SIMETH 200-200-20 MG/5ML PO SUSP: 30.0000 mL | Freq: Four times a day (QID) | ORAL | Status: DC | PRN

## 2012-11-11 MED ORDER — ONDANSETRON HCL 4 MG/2ML IJ SOLN
4.0000 mg | Freq: Once | INTRAMUSCULAR | Status: AC
  Administered 2012-11-11: 4 mg via INTRAVENOUS
  Filled 2012-11-11: qty 2

## 2012-11-11 MED ORDER — ONDANSETRON HCL 4 MG/2ML IJ SOLN: 4.0000 mg | Freq: Three times a day (TID) | INTRAMUSCULAR | Status: DC | PRN

## 2012-11-11 MED ORDER — SODIUM CHLORIDE 0.9 % IJ SOLN
3.0000 mL | Freq: Two times a day (BID) | INTRAMUSCULAR | Status: DC
  Administered 2012-11-12: 3 mL via INTRAVENOUS

## 2012-11-11 MED ORDER — INSULIN GLARGINE 100 UNIT/ML ~~LOC~~ SOLN
10.0000 [IU] | Freq: Every day | SUBCUTANEOUS | Status: DC
  Administered 2012-11-12: 10 [IU] via SUBCUTANEOUS
  Filled 2012-11-11: qty 0.1

## 2012-11-11 MED ORDER — MORPHINE SULFATE 4 MG/ML IJ SOLN
4.0000 mg | Freq: Once | INTRAMUSCULAR | Status: AC
  Administered 2012-11-11: 4 mg via INTRAVENOUS
  Filled 2012-11-11: qty 1

## 2012-11-11 MED ORDER — ONDANSETRON HCL 4 MG PO TABS: 4.0000 mg | ORAL_TABLET | Freq: Four times a day (QID) | ORAL | Status: DC | PRN

## 2012-11-11 MED ORDER — ENOXAPARIN SODIUM 40 MG/0.4ML ~~LOC~~ SOLN
40.0000 mg | SUBCUTANEOUS | Status: DC
  Administered 2012-11-11: 40 mg via SUBCUTANEOUS
  Filled 2012-11-11 (×2): qty 0.4

## 2012-11-11 MED ORDER — PANTOPRAZOLE SODIUM 40 MG PO TBEC
40.0000 mg | DELAYED_RELEASE_TABLET | Freq: Two times a day (BID) | ORAL | Status: DC
  Administered 2012-11-11 – 2012-11-12 (×2): 40 mg via ORAL
  Filled 2012-11-11 (×3): qty 1

## 2012-11-11 MED ORDER — MORPHINE SULFATE 2 MG/ML IJ SOLN: 1.0000 mg | INTRAMUSCULAR | Status: DC | PRN

## 2012-11-11 MED ORDER — ALBUTEROL SULFATE (5 MG/ML) 0.5% IN NEBU: 2.5000 mg | INHALATION_SOLUTION | RESPIRATORY_TRACT | Status: DC | PRN

## 2012-11-11 MED ORDER — INSULIN GLARGINE 100 UNIT/ML ~~LOC~~ SOLN
12.0000 [IU] | Freq: Every day | SUBCUTANEOUS | Status: DC
  Administered 2012-11-11: 12 [IU] via SUBCUTANEOUS
  Filled 2012-11-11 (×2): qty 0.12

## 2012-11-11 MED ORDER — SODIUM CHLORIDE 0.9 % IV SOLN: INTRAVENOUS | Status: DC

## 2012-11-11 MED ORDER — INSULIN GLARGINE 100 UNIT/ML ~~LOC~~ SOLN
10.0000 [IU] | Freq: Every day | SUBCUTANEOUS | Status: DC
  Filled 2012-11-11: qty 0.1

## 2012-11-11 MED ORDER — SODIUM CHLORIDE 0.9 % IJ SOLN: 3.0000 mL | INTRAMUSCULAR | Status: DC | PRN

## 2012-11-11 NOTE — ED Provider Notes (Addendum)
CSN: [id number]     Arrival date & time 11/11/12  4098 History   First MD Initiated Contact with Patient 11/11/12 [phone]     Chief Complaint  Patient presents with  . Chest Pain   (Consider location/radiation/quality/duration/timing/severity/associated sxs/prior Treatment) HPI Comments: [name] is a 62 y.o. Male who noticed onset of chest pain at 8 AM this morning, after eating an egg,  Bacon , and biscuit sandwich. The pain was 10 out of 10 and described as "sharp". The pain improved spontaneously to 2/10, after he drove home. His wife summoned EMS. They arrived and treated him with aspirin and nitroglycerin. This treatment did not change his pain. He continues to have 2/10, pressure-like chest pain. He has similar episode of pain 5 days ago that resolved spontaneously after about one hour. There were no associated symptoms with either episode of chest pain. He denies recent fever, chills, cough, shortness of breath, weakness, dizziness, nausea, vomiting, or change in elimination. There are no other known modifying factors.   Patient is a 62 y.o. male presenting with chest pain. The history is provided by the patient.  Chest Pain   Past Medical History  Diagnosis Date  . Restless leg   . Reflux   . Diabetes mellitus   . Cervicalgia   . Loss of weight   . Impotence of organic origin   . Umbilical hernia without mention of obstruction or gangrene   . Barrett's esophagus   . Unspecified gastritis and gastroduodenitis with hemorrhage   . Hyperlipidemia   . Hypertension   . Other malaise and fatigue   . Hypoglycemia, unspecified   . Tension headache    Past Surgical History  Procedure Laterality Date  . Abcess in mouth    . Hernia repair  2000  . Right knee calcium ball taking out  1992 and 2000   No family history on file. History  Substance Use Topics  . Smoking status: Never Smoker   . Smokeless tobacco: Not on file  . Alcohol Use: No    Review of Systems   Cardiovascular: Positive for chest pain.  All other systems reviewed and are negative.    Allergies  Review of patient's allergies indicates no known allergies.  Home Medications   Current Outpatient Rx  Name  Route  Sig  Dispense  Refill  . esomeprazole (NEXIUM) 20 MG capsule   Oral   Take 20 mg by mouth daily before breakfast.         . Insulin Glargine (LANTUS SOLOSTAR) 100 UNIT/ML SOPN   Subcutaneous   Inject 10-12 Units into the skin 2 (two) times daily. 10 units in the morning and 12 units in the evening.         . pramipexole (MIRAPEX) 0.75 MG tablet   Oral   Take 0.375-0.75 mg by mouth at bedtime. For restless leg dose depends on severity of pain.         . BD PEN NEEDLE NANO U/F 32G X 4 MM MISC      Use one needle daily with Lantus pen   100 each   12     Dispense as written.    BP 102/43  Temp(Src) 97.6 F (36.4 C)  Resp 17  SpO2 94% Physical Exam  Nursing note and vitals reviewed. Constitutional: He is oriented to person, place, and time. He appears well-developed and well-nourished.  HENT:  Head: Normocephalic and atraumatic.  Right Ear: External ear normal.  Left Ear:  External ear normal.  Eyes: Conjunctivae and EOM are normal. Pupils are equal, round, and reactive to light.  Neck: Normal range of motion and phonation normal. Neck supple.  Cardiovascular: Normal rate, regular rhythm, normal heart sounds and intact distal pulses.   Pulmonary/Chest: Effort normal and breath sounds normal. He exhibits no bony tenderness.  Abdominal: Soft. Normal appearance. There is no tenderness.  Musculoskeletal: Normal range of motion.  Neurological: He is alert and oriented to person, place, and time. He has normal strength. No cranial nerve deficit or sensory deficit. He exhibits normal muscle tone. Coordination normal.  Skin: Skin is warm, dry and intact.  Psychiatric: He has a normal mood and affect. His behavior is normal. Judgment and thought content  normal.    ED Course  Procedures (including critical care time)  10:05- he declined additional treatment for pain. He has already received nitroglycerin and aspirin.  12:00- case discussed with Dr. Sander Radon. She is comfortable with discharge in ED, after delta troponin check, and we'll arrange for followup in the office, tomorrow.  Repeat troponin 1330  2:27 PM Reevaluation with update and discussion. After initial assessment and treatment, an updated evaluation reveals he required analgesia for an episode of chest pain. It was similar to the prior chest pain early this morning. It was 10 out of 10, and sharp. The chest pain, resolved with a single dose of morphine, and has not recurred. Lamira Borin L   2:37 PM-Consult complete with hospitalist. Patient case explained and discussed. He agrees to admit patient for further evaluation and treatment. Call ended at 1441   Date: 11/11/12  Rate: 61  Rhythm: normal sinus rhythm  QRS Axis: normal  PR and QT Intervals: normal  ST/T Wave abnormalities: normal  PR and QRS Conduction Disutrbances:none  Narrative Interpretation:   Old EKG Reviewed: none available    Labs Review Labs Reviewed  CBC - Abnormal; Notable for the following:    MCHC 36.4 (*)    Platelets 112 (*)    All other components within normal limits  COMPREHENSIVE METABOLIC PANEL - Abnormal; Notable for the following:    Sodium 132 (*)    Glucose, Bld 238 (*)    GFR calc non Af Amer 86 (*)    All other components within normal limits  POCT I-STAT TROPONIN I   Imaging Review Dg Chest 2 View  11/11/2012   *RADIOLOGY REPORT*  Clinical Data: Chest pain  CHEST - 2 VIEW  Comparison: 04/17/2008  Findings: The cardiac shadow is within normal limits.  The lungs are clear bilaterally.  No acute bony abnormality is noted.  IMPRESSION: No acute abnormality noted.   Original Report Authenticated By: Alcide Clever, M.D.    MDM   1. Chest pain    Chest pain with TIMI one risk factor  profile. Only positives are  insulin-dependent diabetes and hyperlipidemia. His pain is more likely GI. He has had recurrent chest pain in ED. This will require additional evaluation, that would be best managed as an inpatient.  Nursing Notes Reviewed/ Care Coordinated, and agree without changes. Applicable Imaging Reviewed.  Interpretation of Laboratory Data incorporated into ED treatment   Plan: Admit   Flint Melter, MD 11/11/12 1433  Flint Melter, MD 11/11/12 [phone]

## 2012-11-11 NOTE — ED Notes (Signed)
Called report to 3w

## 2012-11-11 NOTE — ED Notes (Signed)
Pt brought to ED by EMS with chest pain,central and non radiating.As per pt pain started after eating breakfast.Asprin 325mg  given by EMS and nitroX3 given but it did not help the pain.

## 2012-11-11 NOTE — Plan of Care (Signed)
Problem: Phase I Progression Outcomes Goal: Aspirin unless contraindicated Outcome: Completed/Met Date Met:  11/11/12 Pt took pta per pt report

## 2012-11-11 NOTE — H&P (Signed)
PATIENT DETAILS Name: [name] Age: 62 y.o. Sex: male Date of Birth: [birth year] Admit Date: 11/11/2012 XBJ:YNWGN, Lenon Curt, MD   CHIEF COMPLAINT:  Chest Pain  HPI: [name] is a 62 y.o. male with a Past Medical History of GERD, Barrett's Esophagus, DM who presents today with the above noted complaint.Per patient an hour after eating breakfast this morning, patient started having retro-sternal chest pain, initially pain was around 5/10, no radiation, no nausea/vomiting or diaphoresis. Later on, during the day, patient's chest pain recurred, this was more severe, around 10/10. No radiation, lasted 45 minutes. He had no nausea, or vomiting or diaphoresis with this second episode as well. He then proceeded to come to the ED for further evaluation. He again had another episode of chest pain in the ED, he was then referred to the Hospitalist service for further evaluation and treatment. Initial EKG and troponin was negative. During my evaluation, patient was chest pain free and stable.  ALLERGIES:  No Known Allergies  PAST MEDICAL HISTORY: Past Medical History  Diagnosis Date  . Restless leg   . Reflux   . Diabetes mellitus   . Cervicalgia   . Loss of weight   . Impotence of organic origin   . Umbilical hernia without mention of obstruction or gangrene   . Barrett's esophagus   . Unspecified gastritis and gastroduodenitis with hemorrhage   . Hyperlipidemia   . Hypertension   . Other malaise and fatigue   . Hypoglycemia, unspecified   . Tension headache     PAST SURGICAL HISTORY: Past Surgical History  Procedure Laterality Date  . Abcess in mouth    . Hernia repair  2000  . Right knee calcium ball taking out  1992 and 2000    MEDICATIONS AT HOME: Prior to Admission medications   Medication Sig Start Date End Date Taking? Authorizing Provider  esomeprazole (NEXIUM) 20 MG capsule Take 20 mg by mouth daily before breakfast.   Yes Historical Provider, MD  Insulin Glargine  (LANTUS SOLOSTAR) 100 UNIT/ML SOPN Inject 10-12 Units into the skin 2 (two) times daily. 10 units in the morning and 12 units in the evening.   Yes Historical Provider, MD  pramipexole (MIRAPEX) 0.75 MG tablet Take 0.375-0.75 mg by mouth at bedtime. For restless leg dose depends on severity of pain.   Yes Historical Provider, MD  BD PEN NEEDLE NANO U/F 32G X 4 MM MISC Use one needle daily with Lantus pen 06/22/12   Edison Pace, RPH-CPP    FAMILY HISTORY: No family history on file.  SOCIAL HISTORY:  reports that he has never smoked. He does not have any smokeless tobacco history on file. He reports that he does not drink alcohol or use illicit drugs.  REVIEW OF SYSTEMS:  Constitutional:   No  weight loss, night sweats,  Fevers, chills, fatigue.  HEENT:    No headaches, Difficulty swallowing,Tooth/dental problems,Sore throat,  No sneezing, itching, ear ache, nasal congestion, post nasal drip,   Cardio-vascular: No chest pain,  Orthopnea, PND, swelling in lower extremities, anasarca,  dizziness, palpitations  GI:  No heartburn, indigestion, abdominal pain, nausea, vomiting, diarrhea, change in       bowel habits, loss of appetite  Resp: No shortness of breath with exertion or at rest.  No excess mucus, no productive cough, No non-productive cough,  No coughing up of blood.No change in color of mucus.No wheezing.No chest wall deformity  Skin:  no rash or lesions.  GU:  no dysuria, change in color of urine, no urgency or frequency.  No flank pain.  Musculoskeletal: No joint pain or swelling.  No decreased range of motion.  No back pain.  Psych: No change in mood or affect. No depression or anxiety.  No memory loss.   PHYSICAL EXAM: Blood pressure 102/43, temperature 97.6 F (36.4 C), resp. rate 17, SpO2 94.00%.  General appearance :Awake, alert, not in any distress. Speech Clear. Not toxic Looking HEENT: Atraumatic and Normocephalic, pupils equally reactive to light and  accomodation Neck: supple, no JVD. No cervical lymphadenopathy.  Chest:Good air entry bilaterally, no added sounds  CVS: S1 S2 regular, no murmurs.  Abdomen: Bowel sounds present, minimal tenderness in the epigastrium, not distended with no gaurding, rigidity or rebound. Extremities: B/L Lower Ext shows no edema, both legs are warm to touch Neurology: Awake alert, and oriented X 3, CN II-XII intact, Non focal Skin:No Rash Wounds:N/A  LABS ON ADMISSION:   Recent Labs  11/11/12 1010  NA 132*  K 4.3  CL 98  CO2 26  GLUCOSE 238*  BUN 21  CREATININE 0.97  CALCIUM 9.3    Recent Labs  11/11/12 1010  AST 22  ALT 32  ALKPHOS 49  BILITOT 0.6  PROT 6.8  ALBUMIN 3.8   No results found for this basename: LIPASE, AMYLASE,  in the last 72 hours  Recent Labs  11/11/12 1010  WBC 5.1  HGB 15.5  HCT 42.6  MCV 84.7  PLT 112*   No results found for this basename: CKTOTAL, CKMB, CKMBINDEX, TROPONINI,  in the last 72 hours No results found for this basename: DDIMER,  in the last 72 hours No components found with this basename: POCBNP,    RADIOLOGIC STUDIES ON ADMISSION: Dg Chest 2 View  11/11/2012   *RADIOLOGY REPORT*  Clinical Data: Chest pain  CHEST - 2 VIEW  Comparison: 04/17/2008  Findings: The cardiac shadow is within normal limits.  The lungs are clear bilaterally.  No acute bony abnormality is noted.  IMPRESSION: No acute abnormality noted.   Original Report Authenticated By: Alcide Clever, M.D.    EKG: Independently reviewed. NSR  ASSESSMENT AND PLAN: Present on Admission:  . Chest pain -atypical in nature -will admit, cycle enzymes. Checl Echo for wall motion -change PPI to BID, Maalox prn -monitor overnight and reassess in the am.Supect could be discharged if above work up negative for outpatient stress test.  . Barrett's esophagus/GERD -will change PPI to BID  . Type II or unspecified type diabetes mellitus without mention of complication -Resume Lantus -add  SSI  . Restless leg -c/w Requip  Further plan will depend as patient's clinical course evolves and further radiologic and laboratory data become available. Patient will be monitored closely.   DVT Prophylaxis: Prophylactic Lovenox  Code Status: Full Code  Total time spent for admission equals 45 minutes.  [facility] Pager [phone]  If 7PM-7AM, please contact night-coverage [email or web address] Password [email or web address] 11/11/2012, 5:05 PM

## 2012-11-12 ENCOUNTER — Encounter: Payer: Self-pay | Admitting: *Deleted

## 2012-11-12 ENCOUNTER — Encounter: Payer: Self-pay | Admitting: Nurse Practitioner

## 2012-11-12 ENCOUNTER — Encounter (HOSPITAL_COMMUNITY): Payer: Self-pay | Admitting: General Practice

## 2012-11-12 DIAGNOSIS — K2270 Barrett's esophagus without dysplasia: Secondary | ICD-10-CM

## 2012-11-12 DIAGNOSIS — IMO0001 Reserved for inherently not codable concepts without codable children: Secondary | ICD-10-CM

## 2012-11-12 LAB — BASIC METABOLIC PANEL
Chloride: 100 mEq/L (ref 96–112)
GFR calc Af Amer: 89 mL/min — ABNORMAL LOW (ref >90)
Potassium: 4.1 mEq/L (ref 3.5–5.1)
Sodium: 136 mEq/L (ref 135–145)

## 2012-11-12 LAB — GLUCOSE, CAPILLARY
Glucose-Capillary: 171 mg/dL — ABNORMAL HIGH (ref 70–99)
Glucose-Capillary: 95 mg/dL (ref 70–99)

## 2012-11-12 LAB — CBC
HCT: 42.6 % (ref 39.0–52.0)
Platelets: 120 10*3/uL — ABNORMAL LOW (ref 150–400)
RDW: 12.7 % (ref 11.5–15.5)
WBC: 6.6 10*3/uL (ref 4.0–10.5)

## 2012-11-12 LAB — TROPONIN I: Troponin I: <0.3 ng/mL (ref <0.30)

## 2012-11-12 MED ORDER — ASPIRIN 325 MG PO TBEC: 325.0000 mg | DELAYED_RELEASE_TABLET | Freq: Every day | ORAL | Status: DC

## 2012-11-12 NOTE — Discharge Summary (Signed)
Physician Discharge Summary  [name] BJY:[id number] DOB: [birth year] DOA: 11/11/2012  PCP: Kimber Relic, MD  Admit date: 11/11/2012 Discharge date: 11/12/2012  Time spent: 30 minutes  Recommendations for Outpatient Follow-up:  Chest pain  -atypical in nature; troponin x3 negative  --change PPI to BID, Maalox prn  -Echocardiogram  -If chest pain resumes recommend PCP consult cardiology and requested an exercise stress  test    . Barrett's esophagus/GERD  -will change PPI to BID   . Type II or unspecified type diabetes mellitus without mention of complication  -Resume Lantus  -add SSI   . Restless leg  -c/w Requip  Further plan will depend as patient's clinical course evolves and further radiologic and laboratory data become available. Patient will be monitored closely.    Discharge Diagnoses:  Principal Problem:   Chest pain Active Problems:   Restless leg   Type II or unspecified type diabetes mellitus without mention of complication, uncontrolled   Barrett's esophagus   Discharge Condition: Stable  Diet recommendation: Diabetic  There were no vitals filed for this visit.  History of present illness:  [name] is a 62 y.o. WM PMHx  GERD, Barrett's Esophagus, DM Type I, umbilical hernia who presents today with the above noted complaint.Per patient an hour after eating breakfast this morning, patient started having retro-sternal chest pain, initially pain was around 5/10, no radiation, no nausea/vomiting or diaphoresis. Later on, during the day, patient's chest pain recurred, this was more severe, around 10/10. No radiation, lasted 45 minutes. He had no nausea, or vomiting or diaphoresis with this second episode as well. He then proceeded to come to the ED for further evaluation. He again had another episode of chest pain in the ED, he was then referred to the Hospitalist service for further evaluation and treatment. Initial EKG and troponin was negative.  During  my evaluation, patient was chest pain free and stable.  TODAY troponin x3 negative. Patient feels ready for discharge. States had one small substernal episode of CP this a.m. which lasts only a few seconds.    Procedures:  Echocardiogram 11/12/2012 Left ventricle: The cavity size was normal. Systolic function was normal. Wall motion was normal; there were no regional wall motion abnormalities.    Discharge Exam: Filed Vitals:   11/11/12 1115 11/11/12 1130 11/11/12 2134 11/12/12 0634  BP: 111/83 102/43 132/79 106/68  Pulse:   57 66  Temp:   97.5 F (36.4 C) 97.6 F (36.4 C)  TempSrc:   Oral Oral  Resp: 13 17 18 18   SpO2:   94% 95%    General:A/O x 4, NAD Cardiovascular: Regular rhythm and rate, negative murmurs rubs gallops, DP/PT 2+ bilateral Respiratory: Clear to auscultation bilateral  Discharge Instructions   Future Appointments Provider Department Dept Phone   11/12/2012 11:30 AM Claudie Revering, NP [facility] [phone]   01/08/2013 8:30 AM [facility] [phone]   01/12/2013 2:45 PM Kimber Relic, MD [facility] [phone]       Medication List    ASK your doctor about these medications       BD PEN NEEDLE NANO U/F 32G X 4 MM Misc  Generic drug:  Insulin Pen Needle  Use one needle daily with Lantus pen     esomeprazole 20 MG capsule  Commonly known as:  NEXIUM  Take 20 mg by mouth daily before breakfast.     LANTUS SOLOSTAR 100 UNIT/ML Sopn  Generic drug:  Insulin Glargine  Inject 10-12 Units into the skin 2 (two) times daily. 10 units in the morning and 12 units in the evening.     pramipexole 0.75 MG tablet  Commonly known as:  MIRAPEX  Take 0.375-0.75 mg by mouth at bedtime. For restless leg dose depends on severity of pain.       No Known Allergies    The results of significant diagnostics from this hospitalization (including imaging, microbiology, ancillary and laboratory) are listed below for  reference.    Significant Diagnostic Studies: Dg Chest 2 View  11/11/2012   *RADIOLOGY REPORT*  Clinical Data: Chest pain  CHEST - 2 VIEW  Comparison: 04/17/2008  Findings: The cardiac shadow is within normal limits.  The lungs are clear bilaterally.  No acute bony abnormality is noted.  IMPRESSION: No acute abnormality noted.   Original Report Authenticated By: Alcide Clever, M.D.    Microbiology: Recent Results (from the past 240 hour(s))  MRSA PCR SCREENING     Status: None   Collection Time    11/11/12  6:14 PM      Result Value Range Status   MRSA by PCR NEGATIVE  NEGATIVE Final   Comment:            The GeneXpert MRSA Assay (FDA     approved for NASAL specimens     only), is one component of a     comprehensive MRSA colonization     surveillance program. It is not     intended to diagnose MRSA     infection nor to guide or     monitor treatment for     MRSA infections.     Labs: Basic Metabolic Panel:  Recent Labs Lab 11/11/12 1010 11/11/12 1711 11/12/12 0500  NA 132*  --  136  K 4.3  --  4.1  CL 98  --  100  CO2 26  --  26  GLUCOSE 238*  --  160*  BUN 21  --  18  CREATININE 0.97 0.89 1.01  CALCIUM 9.3  --  9.1   Liver Function Tests:  Recent Labs Lab 11/11/12 1010  AST 22  ALT 32  ALKPHOS 49  BILITOT 0.6  PROT 6.8  ALBUMIN 3.8   No results found for this basename: LIPASE, AMYLASE,  in the last 168 hours No results found for this basename: AMMONIA,  in the last 168 hours CBC:  Recent Labs Lab 11/11/12 1010 11/11/12 1711 11/12/12 0500  WBC 5.1 5.0 6.6  HGB 15.5 15.1 15.3  HCT 42.6 42.1 42.6  MCV 84.7 84.7 85.9  PLT 112* 106* 120*   Cardiac Enzymes:  Recent Labs Lab 11/11/12 1711 11/11/12 2253 11/12/12 0500  TROPONINI <0.30 <0.30 <0.30   BNP: BNP (last 3 results) No results found for this basename: PROBNP,  in the last 8760 hours CBG:  Recent Labs Lab 11/11/12 1700 11/11/12 2131 11/12/12 0736  GLUCAP 95 142* 171*        Signed:  WOODS, CURTIS, J  [facility] 11/12/2012, 10:59 AM

## 2012-11-12 NOTE — Progress Notes (Signed)
UR Completed Hollin Crewe Graves-Bigelow, RN,BSN [phone]  

## 2012-11-12 NOTE — Progress Notes (Signed)
Echocardiogram 2D Echocardiogram has been performed.  [name] 11/12/2012, 2:06 PM

## 2012-11-12 NOTE — Progress Notes (Signed)
D/c orders received;IV removed with gauze on, pt remains in stable condition, pt meds and instructions reviewed and given to pt; pt d/c to home 

## 2012-11-16 ENCOUNTER — Ambulatory Visit (INDEPENDENT_AMBULATORY_CARE_PROVIDER_SITE_OTHER): Payer: BC Managed Care – PPO | Admitting: Nurse Practitioner

## 2012-11-16 ENCOUNTER — Encounter: Payer: Self-pay | Admitting: Nurse Practitioner

## 2012-11-16 VITALS — BP 118/80 | HR 76 | Temp 97.9°F | Wt 176.0 lb

## 2012-11-16 DIAGNOSIS — R079 Chest pain, unspecified: Secondary | ICD-10-CM

## 2012-11-16 DIAGNOSIS — K219 Gastro-esophageal reflux disease without esophagitis: Secondary | ICD-10-CM

## 2012-11-16 MED ORDER — PANTOPRAZOLE SODIUM 40 MG PO TBEC: 40.0000 mg | DELAYED_RELEASE_TABLET | Freq: Every day | ORAL | Status: DC

## 2012-11-16 MED ORDER — PANTOPRAZOLE SODIUM 40 MG PO TBEC: 40.0000 mg | DELAYED_RELEASE_TABLET | Freq: Two times a day (BID) | ORAL | Status: DC

## 2012-11-16 NOTE — Patient Instructions (Signed)
Will get cardiology referral, abdominal ultrasound, and change PPI to protonix

## 2012-11-16 NOTE — Progress Notes (Signed)
Patient ID: [name], male   DOB: 06/12/1950, 62 y.o.   MRN: [id number]   No Known Allergies  Chief Complaint  Patient presents with  . Hospitalization Follow-up    ER follow-up: Chest Pain(ongoing)    . Results    Discuss Echo results     HPI: Patient is a 62 y.o. male PMHx  GERD, Barrett's Esophagus, DM Type I, umbilical hernia seen in the office today for follow up chest pain; has a sharp pain in center of chest started 1 week ago; had episode 5 days ago had severe pain and went to hospital via EMS; work up was negative and he was discharged the next day. However he is still having chest pain; Activity makes chest pain worse; and has numbness and tingling in arms Was discharged on ASA Since he has been home he has been bothered by the pain; was trying to do work/activity and the pain came back- notes pain with any exertion  No shortness of breath, no sweating episodes. No episodes as intense as the day he went into the hospital  Hx of barrett's esophagus /GERD; PPI was changed to BID Pt also reports history of gallstones  Procedures:  Echocardiogram 11/12/2012 Left ventricle: The cavity size was normal. Systolic function was normal. Wall motion was normal; there were no regional wall motion abnormalities.   Review of Systems:  Review of Systems  Constitutional: Negative for fever, chills and weight loss.  Respiratory: Negative for cough and shortness of breath.   Cardiovascular: Positive for chest pain. Negative for palpitations and leg swelling.  Gastrointestinal: Positive for heartburn and abdominal pain (epigastric tenderness).  Musculoskeletal: Positive for myalgias.  Neurological: Positive for tingling. Negative for dizziness.     Past Medical History  Diagnosis Date  . Restless leg   . Reflux   . Cervicalgia   . Loss of weight   . Impotence of organic origin   . Umbilical hernia without mention of obstruction or gangrene   . Barrett's esophagus   . Unspecified  gastritis and gastroduodenitis with hemorrhage   . Hyperlipidemia   . Hypertension   . Other malaise and fatigue   . Hypoglycemia, unspecified   . Tension headache   . Complication of anesthesia     " i DO NOT WAKE UP VERY WELL "  . PONV (postoperative nausea and vomiting)   . Shortness of breath   . Diabetes mellitus     insulin dependent  . GERD (gastroesophageal reflux disease)    Past Surgical History  Procedure Laterality Date  . Abcess in mouth    . Hernia repair  2000  . Right knee calcium ball taking out  1992 and 2000  . Hernia repair     Social History:   reports that he has never smoked. He has never used smokeless tobacco. He reports that he does not drink alcohol or use illicit drugs.  No family history on file.  Medications: Patient's Medications  New Prescriptions   No medications on file  Previous Medications   ASPIRIN EC 325 MG EC TABLET    Take 1 tablet (325 mg total) by mouth daily.   BD PEN NEEDLE NANO U/F 32G X 4 MM MISC    Use one needle daily with Lantus pen   INSULIN GLARGINE (LANTUS SOLOSTAR) 100 UNIT/ML SOPN    Inject 10-12 Units into the skin 2 (two) times daily. 10 units in the morning and 12 units in the evening.  OMEPRAZOLE (PRILOSEC) 20 MG CAPSULE    Take 20 mg by mouth daily. Take 1 tablet twice daily to reduce stomach acid.   PRAMIPEXOLE (MIRAPEX) 0.75 MG TABLET    Take 0.375-0.75 mg by mouth at bedtime. For restless leg dose depends on severity of pain.  Modified Medications   No medications on file  Discontinued Medications   CANAGLIFLOZIN (INVOKANA) 300 MG TABS    Take 300 mg by mouth every morning. Take 1 tablet each morning to control diabetes.     Physical Exam:  Filed Vitals:   11/16/12 1536  BP: 118/80  Pulse: 76  Temp: 97.9 F (36.6 C)  TempSrc: Oral  Weight: 176 lb (79.833 kg)  SpO2: 95%    Physical Exam  Constitutional: He is oriented to person, place, and time and well-developed, well-nourished, and in no distress.  No distress.  HENT:  Head: Normocephalic and atraumatic.  Eyes: Conjunctivae and EOM are normal. Pupils are equal, round, and reactive to light.  Cardiovascular: Normal rate, regular rhythm and normal heart sounds.   Pulmonary/Chest: Effort normal and breath sounds normal. No respiratory distress. He has no wheezes. He has no rales.  Abdominal: Soft. Bowel sounds are normal. He exhibits no distension. There is tenderness (epigastric and right upper quad). There is no rebound and no guarding.  Musculoskeletal: Normal range of motion. He exhibits no edema and no tenderness.  Neurological: He is alert and oriented to person, place, and time. Gait normal.  Skin: He is not diaphoretic.     Labs reviewed: Basic Metabolic Panel:  Recent Labs  40/98/11 0844 11/11/12 1010 11/11/12 1711 11/12/12 0500  NA 141 132*  --  136  K 4.3 4.3  --  4.1  CL 102 98  --  100  CO2 24 26  --  26  GLUCOSE 160* 238*  --  160*  BUN 16 21  --  18  CREATININE 1.06 0.97 0.89 1.01  CALCIUM 9.6 9.3  --  9.1   Liver Function Tests:  Recent Labs  11/11/12 1010  AST 22  ALT 32  ALKPHOS 49  BILITOT 0.6  PROT 6.8  ALBUMIN 3.8    Recent Labs  06/12/12 0900  LIPASE 34  AMYLASE 69   No results found for this basename: AMMONIA,  in the last 8760 hours CBC:  Recent Labs  11/11/12 1010 11/11/12 1711 11/12/12 0500  WBC 5.1 5.0 6.6  HGB 15.5 15.1 15.3  HCT 42.6 42.1 42.6  MCV 84.7 84.7 85.9  PLT 112* 106* 120*    Assessment/Plan 1. Chest pain Chest presentation to ED; was hospitialized and workup was negative in hospital however they reccommended further workup as outpatient - Ambulatory referral to Cardiology  2. GERD Due to history of gallstones and right upper quad tenderness with epigastric tenderness will get  - US Abdomen Complete to rule out gallstones and will send pantoprazole (PROTONIX) 40 MG tablet; Take 1 tablet (40 mg total) by mouth 2 (two) times daily.  Dispense: 30 tablet;  Refill: 3 to Pharmacy   Educated on when to go back to the ED; pt and wife understand

## 2012-11-20 ENCOUNTER — Other Ambulatory Visit: Payer: BC Managed Care – PPO

## 2012-11-24 ENCOUNTER — Ambulatory Visit
Admission: RE | Admit: 2012-11-24 | Discharge: 2012-11-24 | Disposition: A | Discharge status: Home / Self Care | Payer: BC Managed Care – PPO | Source: Ambulatory Visit | Attending: Nurse Practitioner | Admitting: Nurse Practitioner

## 2012-11-24 DIAGNOSIS — R079 Chest pain, unspecified: Secondary | ICD-10-CM

## 2012-12-01 ENCOUNTER — Encounter (HOSPITAL_COMMUNITY): Payer: Self-pay | Admitting: Pharmacy Technician

## 2012-12-02 ENCOUNTER — Encounter: Payer: Self-pay | Admitting: Internal Medicine

## 2012-12-02 ENCOUNTER — Ambulatory Visit (INDEPENDENT_AMBULATORY_CARE_PROVIDER_SITE_OTHER): Payer: BC Managed Care – PPO | Admitting: Internal Medicine

## 2012-12-02 VITALS — BP 120/78 | HR 72 | Temp 98.4°F | Resp 16 | Ht 66.5 in | Wt 177.8 lb

## 2012-12-02 DIAGNOSIS — R1013 Epigastric pain: Secondary | ICD-10-CM

## 2012-12-02 DIAGNOSIS — R5381 Other malaise: Secondary | ICD-10-CM

## 2012-12-02 DIAGNOSIS — K219 Gastro-esophageal reflux disease without esophagitis: Secondary | ICD-10-CM

## 2012-12-02 DIAGNOSIS — M542 Cervicalgia: Secondary | ICD-10-CM

## 2012-12-02 DIAGNOSIS — I10 Essential (primary) hypertension: Secondary | ICD-10-CM

## 2012-12-02 DIAGNOSIS — R5383 Other fatigue: Secondary | ICD-10-CM

## 2012-12-02 DIAGNOSIS — R079 Chest pain, unspecified: Secondary | ICD-10-CM

## 2012-12-02 DIAGNOSIS — G2581 Restless legs syndrome: Secondary | ICD-10-CM

## 2012-12-02 DIAGNOSIS — R059 Cough, unspecified: Secondary | ICD-10-CM

## 2012-12-02 DIAGNOSIS — IMO0001 Reserved for inherently not codable concepts without codable children: Secondary | ICD-10-CM

## 2012-12-02 DIAGNOSIS — R05 Cough: Secondary | ICD-10-CM

## 2012-12-02 MED ORDER — INSULIN GLARGINE 100 UNIT/ML SOLOSTAR PEN: PEN_INJECTOR | SUBCUTANEOUS | Status: DC

## 2012-12-02 NOTE — Patient Instructions (Signed)
Note increase in suggested insulin dosage.Continue other current medications.

## 2012-12-02 NOTE — Progress Notes (Signed)
Subjective:    Patient ID: [name], male    DOB: [birth year], 62 y.o.   MRN: [id number]  HPI  Chest pain: Still has chest pain with exertion. Starts to have discomfort about 50 yards. Using NTG sometimes. Gives him a headache. Stopping his walk helps; takes 5 to 10 min. Patient is adopted and does not know of his family history for cardiovascular disease. DrMarland Kitchen [name] to do cateterization 12/16/12  Cough; improved, but still present  Unspecified essential hypertension: controlled  Other malaise and fatigue; unchanged  Restless legs syndrome (RLS); unchanged  Type I (juvenile type) diabetes mellitus with peripheral circulatory disorders, not stated as uncontrolled(250.71): not controlled. AM CBG 150-260  Reflux: resolved  Cervicalgia: chronic and unchanged  Abdominal pain, epigastric: inmproved    Current Outpatient Prescriptions on File Prior to Visit  Medication Sig Dispense Refill  . aspirin EC 325 MG EC tablet Take 1 tablet (325 mg total) by mouth daily.  60 tablet  0  . atorvastatin (LIPITOR) 20 MG tablet Take 20 mg by mouth daily.      . BD PEN NEEDLE NANO U/F 32G X 4 MM MISC Use one needle daily with Lantus pen  100 each  12  . carvedilol (COREG) 6.25 MG tablet Take 6.25 mg by mouth 2 (two) times daily with a meal.      . Insulin Glargine (LANTUS SOLOSTAR) 100 UNIT/ML SOPN Inject 10-12 Units into the skin 2 (two) times daily. 10 units in the morning and 12 units in the evening.      . nitroGLYCERIN (NITROSTAT) 0.4 MG SL tablet Place 0.4 mg under the tongue every 5 (five) minutes as needed for chest pain.      . pantoprazole (PROTONIX) 40 MG tablet Take 1 tablet (40 mg total) by mouth 2 (two) times daily.  30 tablet  3  . pramipexole (MIRAPEX) 0.75 MG tablet Take 0.375-0.75 mg by mouth at bedtime. For restless leg dose depends on severity of pain.       No current facility-administered medications on file prior to visit.    Review of Systems  Constitutional: Positive  for fatigue. Negative for fever.  HENT: Positive for hearing loss. Negative for ear pain, neck pain and neck stiffness.   Eyes: Negative.        Wears prescription lenses.  Respiratory: Negative for cough and shortness of breath.   Cardiovascular: Negative for chest pain, palpitations and leg swelling.  Endocrine: Negative for cold intolerance, heat intolerance, polydipsia, polyphagia and polyuria.       Diabetes under poor control.  Genitourinary: Negative.   Musculoskeletal: Positive for myalgias, back pain, joint swelling, arthralgias and gait problem.  Skin: Negative.   Allergic/Immunologic: Negative.   Neurological: Positive for weakness and headaches. Negative for dizziness, tremors, seizures, syncope, facial asymmetry, speech difficulty, light-headedness and numbness.  Hematological: Negative.   Psychiatric/Behavioral: The patient is nervous/anxious.        Objective:   Physical Exam  Constitutional: He is oriented to person, place, and time. He appears well-developed and well-nourished. No distress.  Significantly overweight.  HENT:  Head: Normocephalic and atraumatic.  Right Ear: External ear normal.  Left Ear: External ear normal.  Nose: Nose normal.  Mouth/Throat: Oropharynx is clear and moist.  Eyes: Conjunctivae and EOM are normal. Pupils are equal, round, and reactive to light.  Neck: Normal range of motion. Neck supple. No JVD present. No tracheal deviation present. No thyromegaly present.  Cardiovascular: Normal rate, regular rhythm, normal heart  sounds and intact distal pulses.  Exam reveals no gallop and no friction rub.   No murmur heard. Pulmonary/Chest: Effort normal. No respiratory distress. He has no wheezes. He has no rales. He exhibits no tenderness.  Abdominal: Bowel sounds are normal. He exhibits no distension and no mass.  Musculoskeletal: Normal range of motion. He exhibits no edema and no tenderness.  Lymphadenopathy:    He has no cervical adenopathy.   Neurological: He is alert and oriented to person, place, and time. He has normal reflexes. No cranial nerve deficit. Coordination normal.  Skin: Skin is warm and dry. No rash noted. No erythema. No pallor.  Psychiatric: He has a normal mood and affect. His behavior is normal. Judgment and thought content normal.      Admission on 11/11/2012, Discharged on 11/12/2012  Component Date Value Range Status  . WBC 11/11/2012 5.1  4.0 - 10.5 K/uL Final  . RBC 11/11/2012 5.03  4.22 - 5.81 MIL/uL Final  . Hemoglobin 11/11/2012 15.5  13.0 - 17.0 g/dL Final  . HCT 16/12/9602 42.6  39.0 - 52.0 % Final  . MCV 11/11/2012 84.7  78.0 - 100.0 fL Final  . MCH 11/11/2012 30.8  26.0 - 34.0 pg Final  . MCHC 11/11/2012 36.4* 30.0 - 36.0 g/dL Final  . RDW 54/11/8117 12.4  11.5 - 15.5 % Final  . Platelets 11/11/2012 112* 150 - 400 K/uL Final   Comment: PLATELET COUNT CONFIRMED BY SMEAR                          REPEATED TO VERIFY  . Sodium 11/11/2012 132* 135 - 145 mEq/L Final  . Potassium 11/11/2012 4.3  3.5 - 5.1 mEq/L Final  . Chloride 11/11/2012 98  96 - 112 mEq/L Final  . CO2 11/11/2012 26  19 - 32 mEq/L Final  . Glucose, Bld 11/11/2012 238* 70 - 99 mg/dL Final  . BUN 14/78/2956 21  6 - 23 mg/dL Final  . Creatinine, Ser 11/11/2012 0.97  0.50 - 1.35 mg/dL Final  . Calcium 21/30/8657 9.3  8.4 - 10.5 mg/dL Final  . Total Protein 11/11/2012 6.8  6.0 - 8.3 g/dL Final  . Albumin 84/69/6295 3.8  3.5 - 5.2 g/dL Final  . AST 28/41/3244 22  0 - 37 U/L Final  . ALT 11/11/2012 32  0 - 53 U/L Final  . Alkaline Phosphatase 11/11/2012 49  39 - 117 U/L Final  . Total Bilirubin 11/11/2012 0.6  0.3 - 1.2 mg/dL Final  . GFR calc non Af Amer 11/11/2012 86* >90 mL/min Final  . GFR calc Af Amer 11/11/2012 >90  >90 mL/min Final   Comment: (NOTE)                          The eGFR has been calculated using the CKD EPI equation.                          This calculation has not been validated in all clinical situations.                           eGFR's persistently <90 mL/min signify possible Chronic Kidney                          Disease.  . Troponin i, poc 11/11/2012 0.01  0.00 - 0.08 ng/mL Final  . Comment 3 11/11/2012          Final   Comment: Due to the release kinetics of cTnI,                          a negative result within the first hours                          of the onset of symptoms does not rule out                          myocardial infarction with certainty.                          If myocardial infarction is still suspected,                          repeat the test at appropriate intervals.  . Troponin i, poc 11/11/2012 0.01  0.00 - 0.08 ng/mL Final  . Comment 3 11/11/2012          Final   Comment: Due to the release kinetics of cTnI,                          a negative result within the first hours                          of the onset of symptoms does not rule out                          myocardial infarction with certainty.                          If myocardial infarction is still suspected,                          repeat the test at appropriate intervals.  . WBC 11/11/2012 5.0  4.0 - 10.5 K/uL Final  . RBC 11/11/2012 4.97  4.22 - 5.81 MIL/uL Final  . Hemoglobin 11/11/2012 15.1  13.0 - 17.0 g/dL Final  . HCT 16/12/9602 42.1  39.0 - 52.0 % Final  . MCV 11/11/2012 84.7  78.0 - 100.0 fL Final  . MCH 11/11/2012 30.4  26.0 - 34.0 pg Final  . MCHC 11/11/2012 35.9  30.0 - 36.0 g/dL Final  . RDW 54/11/8117 12.6  11.5 - 15.5 % Final  . Platelets 11/11/2012 106* 150 - 400 K/uL Final   CONSISTENT WITH PREVIOUS RESULT  . Creatinine, Ser 11/11/2012 0.89  0.50 - 1.35 mg/dL Final  . GFR calc non Af Amer 11/11/2012 89* >90 mL/min Final  . GFR calc Af Amer 11/11/2012 >90  >90 mL/min Final   Comment: (NOTE)                          The eGFR has been calculated using the CKD EPI equation.                          This calculation has not been validated in all clinical situations.  eGFR's persistently <90 mL/min signify possible Chronic Kidney                          Disease.  . Troponin I 11/11/2012 <0.30  <0.30 ng/mL Final   Comment:                                 Due to the release kinetics of cTnI,                          a negative result within the first hours                          of the onset of symptoms does not rule out                          myocardial infarction with certainty.                          If myocardial infarction is still suspected,                          repeat the test at appropriate intervals.  . Troponin I 11/11/2012 <0.30  <0.30 ng/mL Final   Comment:                                 Due to the release kinetics of cTnI,                          a negative result within the first hours                          of the onset of symptoms does not rule out                          myocardial infarction with certainty.                          If myocardial infarction is still suspected,                          repeat the test at appropriate intervals.  . Troponin I 11/12/2012 <0.30  <0.30 ng/mL Final   Comment:                                 Due to the release kinetics of cTnI,                          a negative result within the first hours                          of the onset of symptoms does not rule out                          myocardial infarction with certainty.  If myocardial infarction is still suspected,                          repeat the test at appropriate intervals.  . WBC 11/12/2012 6.6  4.0 - 10.5 K/uL Final  . RBC 11/12/2012 4.96  4.22 - 5.81 MIL/uL Final  . Hemoglobin 11/12/2012 15.3  13.0 - 17.0 g/dL Final  . HCT 04/54/0981 42.6  39.0 - 52.0 % Final  . MCV 11/12/2012 85.9  78.0 - 100.0 fL Final  . MCH 11/12/2012 30.8  26.0 - 34.0 pg Final  . MCHC 11/12/2012 35.9  30.0 - 36.0 g/dL Final  . RDW 19/14/7829 12.7  11.5 - 15.5 % Final  . Platelets 11/12/2012 120* 150 - 400  K/uL Final  . Sodium 11/12/2012 136  135 - 145 mEq/L Final  . Potassium 11/12/2012 4.1  3.5 - 5.1 mEq/L Final  . Chloride 11/12/2012 100  96 - 112 mEq/L Final  . CO2 11/12/2012 26  19 - 32 mEq/L Final  . Glucose, Bld 11/12/2012 160* 70 - 99 mg/dL Final  . BUN 56/21/3086 18  6 - 23 mg/dL Final  . Creatinine, Ser 11/12/2012 1.01  0.50 - 1.35 mg/dL Final  . Calcium 57/84/6962 9.1  8.4 - 10.5 mg/dL Final  . GFR calc non Af Amer 11/12/2012 77* >90 mL/min Final  . GFR calc Af Amer 11/12/2012 89* >90 mL/min Final   Comment: (NOTE)                          The eGFR has been calculated using the CKD EPI equation.                          This calculation has not been validated in all clinical situations.                          eGFR's persistently <90 mL/min signify possible Chronic Kidney                          Disease.  . Glucose-Capillary 11/11/2012 95  70 - 99 mg/dL Final  . Comment 1 95/28/4132 Documented in Chart   Final  . Comment 2 11/11/2012 Notify RN   Final  . MRSA by PCR 11/11/2012 NEGATIVE  NEGATIVE Final   Comment:                                 The GeneXpert MRSA Assay (FDA                          approved for NASAL specimens                          only), is one component of a                          comprehensive MRSA colonization                          surveillance program. It is not  intended to diagnose MRSA                          infection nor to guide or                          monitor treatment for                          MRSA infections.  . Glucose-Capillary 11/11/2012 142* 70 - 99 mg/dL Final  . Comment 1 16/12/9602 Notify RN   Final  . Glucose-Capillary 11/12/2012 171* 70 - 99 mg/dL Final  . Comment 1 54/11/8117 Notify RN   Final  . Glucose-Capillary 11/12/2012 95  70 - 99 mg/dL Final  . Comment 1 14/78/2956 Notify RN   Final  . Glucose-Capillary 11/12/2012 237* 70 - 99 mg/dL Final  . Comment 1 21/30/8657 Notify RN   Final   Abstract on 11/12/2012  Component Date Value Range Status  . HM Colonoscopy 06/29/2008 Internal hemorrhoids   Final  Appointment on 10/05/2012  Component Date Value Range Status  . Hemoglobin A1C 10/05/2012 8.5* 4.8 - 5.6 % Final   Comment:          Increased risk for diabetes: 5.7 - 6.4                                   Diabetes: >6.4                                   Glycemic control for adults with diabetes: <7.0  . Estimated average glucose 10/05/2012 197   Final  . Glucose 10/05/2012 160* 65 - 99 mg/dL Final  . BUN 84/69/6295 16  8 - 27 mg/dL Final  . Creatinine, Ser 10/05/2012 1.06  0.76 - 1.27 mg/dL Final  . GFR calc non Af Amer 10/05/2012 74  >59 mL/min/1.73 Final  . GFR calc Af Amer 10/05/2012 86  >59 mL/min/1.73 Final  . BUN/Creatinine Ratio 10/05/2012 15  10 - 22 Final  . Sodium 10/05/2012 141  134 - 144 mmol/L Final  . Potassium 10/05/2012 4.3  3.5 - 5.2 mmol/L Final  . Chloride 10/05/2012 102  97 - 108 mmol/L Final  . CO2 10/05/2012 24  18 - 29 mmol/L Final  . Calcium 10/05/2012 9.6  8.6 - 10.2 mg/dL Final       Assessment & Plan:  Chest pain: proceed with catheterization  Cough: improved  Unspecified essential hypertension; controlled  Other malaise and fatigue: unchanged  Restless legs syndrome (RLS): unchanged  Reflux; improved  Cervicalgia: stable/ improved  Abdominal pain, epigastric: improved  Type II or unspecified type diabetes mellitus without mention of complication, uncontrolled: increase Lantus to 14 u qAM and 20 u qPM

## 2012-12-03 ENCOUNTER — Ambulatory Visit: Payer: BC Managed Care – PPO | Admitting: Internal Medicine

## 2012-12-15 ENCOUNTER — Observation Stay (HOSPITAL_COMMUNITY)
Admission: RE | Admit: 2012-12-15 | Discharge: 2012-12-16 | Disposition: A | Discharge status: Home / Self Care | Payer: BC Managed Care – PPO | Source: Ambulatory Visit | Attending: Cardiology | Admitting: Cardiology

## 2012-12-15 ENCOUNTER — Encounter (HOSPITAL_COMMUNITY)
Admission: RE | Disposition: A | Discharge status: Home / Self Care | Payer: Self-pay | Source: Ambulatory Visit | Attending: Cardiology

## 2012-12-15 ENCOUNTER — Encounter (HOSPITAL_COMMUNITY): Payer: Self-pay | Admitting: General Practice

## 2012-12-15 DIAGNOSIS — Z794 Long term (current) use of insulin: Secondary | ICD-10-CM | POA: Insufficient documentation

## 2012-12-15 DIAGNOSIS — E785 Hyperlipidemia, unspecified: Secondary | ICD-10-CM | POA: Insufficient documentation

## 2012-12-15 DIAGNOSIS — D696 Thrombocytopenia, unspecified: Secondary | ICD-10-CM | POA: Insufficient documentation

## 2012-12-15 DIAGNOSIS — I2510 Atherosclerotic heart disease of native coronary artery without angina pectoris: Secondary | ICD-10-CM

## 2012-12-15 DIAGNOSIS — E119 Type 2 diabetes mellitus without complications: Secondary | ICD-10-CM | POA: Insufficient documentation

## 2012-12-15 DIAGNOSIS — IMO0001 Reserved for inherently not codable concepts without codable children: Secondary | ICD-10-CM

## 2012-12-15 DIAGNOSIS — I209 Angina pectoris, unspecified: Secondary | ICD-10-CM | POA: Insufficient documentation

## 2012-12-15 DIAGNOSIS — I25118 Atherosclerotic heart disease of native coronary artery with other forms of angina pectoris: Secondary | ICD-10-CM

## 2012-12-15 DIAGNOSIS — Z955 Presence of coronary angioplasty implant and graft: Secondary | ICD-10-CM

## 2012-12-15 DIAGNOSIS — J209 Acute bronchitis, unspecified: Secondary | ICD-10-CM | POA: Insufficient documentation

## 2012-12-15 DIAGNOSIS — Z7902 Long term (current) use of antithrombotics/antiplatelets: Secondary | ICD-10-CM | POA: Insufficient documentation

## 2012-12-15 DIAGNOSIS — Z79899 Other long term (current) drug therapy: Secondary | ICD-10-CM | POA: Insufficient documentation

## 2012-12-15 HISTORY — DX: Long term (current) use of insulin: Z79.4

## 2012-12-15 HISTORY — PX: LEFT HEART CATHETERIZATION WITH CORONARY ANGIOGRAM: SHX5451

## 2012-12-15 HISTORY — DX: Unspecified chronic bronchitis: J42

## 2012-12-15 HISTORY — DX: Type 2 diabetes mellitus without complications: E11.9

## 2012-12-15 HISTORY — DX: Reserved for inherently not codable concepts without codable children: IMO0001

## 2012-12-15 HISTORY — DX: Cardiac murmur, unspecified: R01.1

## 2012-12-15 LAB — POCT ACTIVATED CLOTTING TIME: Activated Clotting Time: 575 seconds

## 2012-12-15 LAB — GLUCOSE, CAPILLARY
Glucose-Capillary: 170 mg/dL — ABNORMAL HIGH (ref 70–99)
Glucose-Capillary: 118 mg/dL — ABNORMAL HIGH (ref 70–99)
Glucose-Capillary: 174 mg/dL — ABNORMAL HIGH (ref 70–99)

## 2012-12-15 SURGERY — LEFT HEART CATHETERIZATION WITH CORONARY ANGIOGRAM
Anesthesia: LOCAL

## 2012-12-15 MED ORDER — SODIUM CHLORIDE 0.9 % IJ SOLN: 3.0000 mL | Freq: Two times a day (BID) | INTRAMUSCULAR | Status: DC

## 2012-12-15 MED ORDER — MIDAZOLAM HCL 2 MG/2ML IJ SOLN
INTRAMUSCULAR | Status: AC
  Filled 2012-12-15: qty 2

## 2012-12-15 MED ORDER — HEART ATTACK BOUNCING BOOK
Freq: Once | Status: AC
  Administered 2012-12-15: 20:00:00
  Filled 2012-12-15: qty 1

## 2012-12-15 MED ORDER — PRASUGREL HCL 10 MG PO TABS
10.0000 mg | ORAL_TABLET | Freq: Every day | ORAL | Status: DC
  Administered 2012-12-16: 10:00:00 10 mg via ORAL
  Filled 2012-12-15 (×2): qty 1

## 2012-12-15 MED ORDER — SODIUM CHLORIDE 0.9 % IV SOLN: 250.0000 mL | INTRAVENOUS | Status: DC | PRN

## 2012-12-15 MED ORDER — BIVALIRUDIN 250 MG IV SOLR
INTRAVENOUS | Status: AC
  Filled 2012-12-15: qty 250

## 2012-12-15 MED ORDER — PRAMIPEXOLE DIHYDROCHLORIDE 0.25 MG PO TABS
0.7500 mg | ORAL_TABLET | Freq: Every day | ORAL | Status: DC
  Administered 2012-12-15: 20:00:00 0.75 mg via ORAL
  Filled 2012-12-15 (×2): qty 3

## 2012-12-15 MED ORDER — PANTOPRAZOLE SODIUM 40 MG PO TBEC
40.0000 mg | DELAYED_RELEASE_TABLET | Freq: Two times a day (BID) | ORAL | Status: DC
  Administered 2012-12-15 – 2012-12-16 (×3): 40 mg via ORAL
  Filled 2012-12-15 (×3): qty 1

## 2012-12-15 MED ORDER — NITROGLYCERIN 0.4 MG SL SUBL: 0.4000 mg | SUBLINGUAL_TABLET | SUBLINGUAL | Status: DC | PRN

## 2012-12-15 MED ORDER — ASPIRIN 81 MG PO CHEW
81.0000 mg | CHEWABLE_TABLET | Freq: Every day | ORAL | Status: DC
  Administered 2012-12-16: 81 mg via ORAL
  Filled 2012-12-15: qty 1

## 2012-12-15 MED ORDER — ONDANSETRON HCL 4 MG/2ML IJ SOLN: 4.0000 mg | Freq: Four times a day (QID) | INTRAMUSCULAR | Status: DC | PRN

## 2012-12-15 MED ORDER — SODIUM CHLORIDE 0.9 % IJ SOLN: 3.0000 mL | INTRAMUSCULAR | Status: DC | PRN

## 2012-12-15 MED ORDER — ACETAMINOPHEN 325 MG PO TABS
650.0000 mg | ORAL_TABLET | ORAL | Status: DC | PRN
  Administered 2012-12-15: 650 mg via ORAL
  Filled 2012-12-15 (×2): qty 2

## 2012-12-15 MED ORDER — HYDROMORPHONE HCL PF 2 MG/ML IJ SOLN
INTRAMUSCULAR | Status: AC
  Filled 2012-12-15: qty 1

## 2012-12-15 MED ORDER — SODIUM CHLORIDE 0.9 % IV BOLUS (SEPSIS)
500.0000 mL | Freq: Once | INTRAVENOUS | Status: AC
  Administered 2012-12-15: 500 mL via INTRAVENOUS

## 2012-12-15 MED ORDER — SODIUM CHLORIDE 0.9 % IV SOLN: INTRAVENOUS | Status: DC

## 2012-12-15 MED ORDER — SODIUM CHLORIDE 0.9 % IV SOLN: 1.0000 mL/kg/h | INTRAVENOUS | Status: AC

## 2012-12-15 MED ORDER — VERAPAMIL HCL 2.5 MG/ML IV SOLN
INTRAVENOUS | Status: AC
  Filled 2012-12-15: qty 2

## 2012-12-15 MED ORDER — CARVEDILOL 6.25 MG PO TABS
6.2500 mg | ORAL_TABLET | Freq: Two times a day (BID) | ORAL | Status: DC
  Administered 2012-12-15 – 2012-12-16 (×2): 6.25 mg via ORAL
  Filled 2012-12-15 (×4): qty 1

## 2012-12-15 MED ORDER — NITROGLYCERIN 0.2 MG/ML ON CALL CATH LAB
INTRAVENOUS | Status: AC
  Filled 2012-12-15: qty 1

## 2012-12-15 MED ORDER — ATORVASTATIN CALCIUM 20 MG PO TABS
20.0000 mg | ORAL_TABLET | Freq: Every day | ORAL | Status: DC
  Administered 2012-12-15: 17:00:00 20 mg via ORAL
  Filled 2012-12-15 (×2): qty 1

## 2012-12-15 MED ORDER — INSULIN ASPART 100 UNIT/ML ~~LOC~~ SOLN: 0.0000 [IU] | Freq: Every day | SUBCUTANEOUS | Status: DC

## 2012-12-15 MED ORDER — PRASUGREL HCL 10 MG PO TABS
ORAL_TABLET | ORAL | Status: AC
  Filled 2012-12-15: qty 6

## 2012-12-15 MED ORDER — ACTIVE PARTNERSHIP FOR HEALTH OF YOUR HEART BOOK
Freq: Once | Status: AC
  Administered 2012-12-15: 20:00:00
  Filled 2012-12-15: qty 1

## 2012-12-15 MED ORDER — HEPARIN (PORCINE) IN NACL 2-0.9 UNIT/ML-% IJ SOLN
INTRAMUSCULAR | Status: AC
  Filled 2012-12-15: qty 1000

## 2012-12-15 MED ORDER — INSULIN ASPART 100 UNIT/ML ~~LOC~~ SOLN
0.0000 [IU] | Freq: Three times a day (TID) | SUBCUTANEOUS | Status: DC
  Administered 2012-12-15: 18:00:00 2 [IU] via SUBCUTANEOUS
  Administered 2012-12-16: 09:00:00 3 [IU] via SUBCUTANEOUS

## 2012-12-15 MED ORDER — ASPIRIN 81 MG PO CHEW
81.0000 mg | CHEWABLE_TABLET | ORAL | Status: AC
  Administered 2012-12-15: 81 mg via ORAL

## 2012-12-15 MED ORDER — HEPARIN SODIUM (PORCINE) 1000 UNIT/ML IJ SOLN
INTRAMUSCULAR | Status: AC
  Filled 2012-12-15: qty 1

## 2012-12-15 MED ORDER — LIDOCAINE HCL (PF) 1 % IJ SOLN
INTRAMUSCULAR | Status: AC
  Filled 2012-12-15: qty 30

## 2012-12-15 NOTE — Progress Notes (Signed)
TR BAND REMOVAL  LOCATION:    right radial  DEFLATED PER PROTOCOL:    yes  TIME BAND OFF / DRESSING APPLIED:    1500   SITE UPON ARRIVAL:    Level 1  SITE AFTER BAND REMOVAL:    Level 1  REVERSE ALLEN'S TEST:     positive  CIRCULATION SENSATION AND MOVEMENT:    Within Normal Limits   yes  COMMENTS:   Tolerated procedure well, site level 1, bruised ,soft, no hematoma

## 2012-12-15 NOTE — H&P (Signed)
  Please see office visit notes for complete details of HPI.  

## 2012-12-15 NOTE — CV Procedure (Signed)
Procedure performed:  Left heart catheterization including hemodynamic monitoring of the left ventricle, LV gram. Selective right and left coronary arteriography. PTCA and stenting of the proximal and midsegment of the LAD with implantation of a 2.75 x 38 mm promos Premier drug-eluting stent for a high-grade 99% stenosis. Balloon angioplasty of the D1 segment of the LAD with a 2.75 x 20 mm Empera balloon. PTCA and balloon angioplasty of the distal LAD high-grade 90% stenosis with  2.0 x 15 mm mini trek balloon. PTCA and stenting of the distal RCA with implantation of a 2.75 x 24 mm promos Premier drug-eluting stent for a high-grade 99% stenosis of the distal RCA bifurcation into the PDA.  Indication: Patient is a 62 year-old Caucasian male with history of  hyperlipidemia, who presents with class III angina pectoris,. Patient has  had non invasive testing which was abnormal at submaximal stress test with chest pain, EKG abnormalities and mild inferior perfusion defect.  Hence is brought to the cardiac catheterization lab to evaluate  coronary anatomy for definitive diagnosis of CAD.  Hemodynamic data: Left ventricular pressure was 101/6 with LVEDP of 8 mm mercury. Aortic pressure was 103/73 with a mean of 88 mm mercury. There was no pressure gradient across the aortic valve.   Left ventricle: Performed in the RAO projection revealed LVEF of 60 %. There was no MR. no wall motion abnormality.  Right coronary artery: The vessel is Dominant. Proximal and midsegment RCA shows mild 10-20% stenosis. Distal RCA at the bifurcation of PDA and a very small PL branch has a high-grade 99% stenosis.  Left main coronary artery  is nonexistent as LAD and circumflex coronary artery had separate ostia.  Circumflex coronary artery: A large vessel giving origin to a large obtuse marginal 1. The obtuse marginal 1 and distal circumflex bifurcation has 30% stenosis.  LAD:  LAD gives origin to a large diagonal-1.  LAD has  proximal long segment high-grade 95% tandem stenosis. The ostium of the D1 has high-grade 90% stenosis. Mid to distal LAD has a 90% focal stenosis. Rest of the LAD shows mild luminal irregularities.   Impression: High-grade two-vessel coronary artery disease, complex LAD bifurcation disease and a moderately complex distal RCA bifurcation stenosis.  Interventional data: Successful PTCA and stenting of the proximal and midsegment of the LAD with implantation of a 2.75 x 38 mm promos Premier drug-eluting stent for a high-grade 99% stenosis. Balloon angioplasty of the D1 segment of the LAD with a 2.75 x 20 mm Empera balloon. PTCA and balloon angioplasty of the distal LAD high-grade 90% stenosis with  2.0 x 15 mm mini trek balloon. PTCA and stenting of the distal RCA with implantation of a 2.75 x 24 mm promos Premier drug-eluting stent for a high-grade 99% stenosis of the distal RCA bifurcation into the PDA.  Will need Dual antiplatelet therapy with Effient and ASA 81 mg for at least one year.   Technique of diagnostic cardiac catheterization:  Under sterile precautions using a 6 French right radial  arterial access, a 6 French sheath was introduced into the right radial artery. A 5 [place] Tig 4 catheter was advanced into the ascending aorta selective  right coronary artery and left coronary artery was cannulated and angiography was performed in multiple views. The catheter was pulled back Out of the body over exchange length J-wire.  Same Catheter was used to perform LV gram which was performed in LAO projection. Catheter exchanged out of the body over J-Wire.   Technique  of intervention:  Using a 6 [place] Ikari L 3.5 guide catheter the LAD  coronary  was selected and cannulated. Using Angiomax for anticoagulation, I utilized a BMW guidewire into the LAD and a run through guidewire and across the D1 coronary artery without any difficulty. I placed the tip of the wire into the distal  coronary artery.  Angiography was performed.   Then I utilized a 2.5 x 20 mm Empira balloon , I performed balloon angioplasty at 10 atmospheric pressure into the D1 segment ostium. I then withdrew the balloon back with excellent results without any residual recall and I placed the same balloon into the LAD over the run through wire and balloon angioplasty of the LAD was performed at 10 atmospheric pressure for 50 seconds. This was followed by angiography and nitroglycerin administration. I decided to then stent the LAD stenosis with a long 2.75 x 38 mm promos Premier drug-eluting stent and under fluoroscopic and angiographic guidance safely positioned the stent and deployed this at 12 atmospheric pressure for 60 seconds followed by angiography. Excellent result without any residual stenosis was evident without any edge dissection and with brisk flow through the diagonal branch without any side branch jailing. The diagonal vessel remained widely patent without any recoil.  I then proceeded to do balloon angioplasty of the distal LAD which was performed with a 2.0 x 15 mm mini trek balloon at 1 atmospheric pressure for 90 seconds followed by intracoronary nitroglycerin administration and repeat angiography. Excellent result was again evident without any residual recall of dissection. The guidewire was withdrawn, angiography repeated and disengaged from the LAD.  Attention was directed towards the right coronary artery and I initially engage the right coronary artery with the same guide catheter however after having performed the balloon angioplasty of the PDA branch and the distal RCA with a 2.0 x 15 mm mini trek balloon that was still in the guide catheter over the run through wire, the guide catheter disengaged and hence I had to withdraw all the equipment out and exchanged to a 6 [place] JR 4 guide catheter. I then proceeded with stenting of the distal RCA placing the stent into the PDA branch and a 2.75 x 24 mm promos Premier  drug-eluting stent was implanted into the distal RCA at 10 atmospheric pressure for 60 seconds followed by repeat angiography which revealed excellent results without any evidence of haziness or edge dissection. At this point the guidewire was withdrawn angiography repeated guide catheter disengaged and pulled out of the body over J-wire.  Hemostasis was obtained by applying TR band. Patient tolerated procedure. There was no immediate competitions. A total of 275 cc of contrast was utilized for multivessel angioplasty.   Disposition: Patient will be discharged in morning unless complications with out-patient follow up.

## 2012-12-15 NOTE — Interval H&P Note (Signed)
History and Physical Interval Note:  12/15/2012 9:23 AM  [name]  has presented today for surgery, with the diagnosis of cp  The various methods of treatment have been discussed with the patient and family. After consideration of risks, benefits and other options for treatment, the patient has consented to  Procedure(s): LEFT HEART CATHETERIZATION WITH CORONARY ANGIOGRAM (N/A) and possible PCI as a surgical intervention .  The patient's history has been reviewed, patient examined, no change in status, stable for surgery.  I have reviewed the patient's chart and labs.  Questions were answered to the patient's satisfaction.    Cath Lab Visit (complete for each Cath Lab visit)  Clinical Evaluation Leading to the Procedure:   ACS: no  Non-ACS:    Anginal Classification: CCS III  Anti-ischemic medical therapy: Maximal Therapy (2 or more classes of medications)  Non-Invasive Test Results: Intermediate-risk stress test findings: cardiac mortality 1-3%/year  Prior CABG: No previous CABG   [facility]

## 2012-12-16 LAB — BASIC METABOLIC PANEL
Chloride: 105 mEq/L (ref 96–112)
GFR calc Af Amer: >90 mL/min (ref >90)
GFR calc non Af Amer: 88 mL/min — ABNORMAL LOW (ref >90)
Glucose, Bld: 141 mg/dL — ABNORMAL HIGH (ref 70–99)
Potassium: 3.7 mEq/L (ref 3.5–5.1)
Sodium: 140 mEq/L (ref 135–145)

## 2012-12-16 LAB — CBC
Hemoglobin: 13.9 g/dL (ref 13.0–17.0)
MCHC: 36.2 g/dL — ABNORMAL HIGH (ref 30.0–36.0)
Platelets: 106 10*3/uL — ABNORMAL LOW (ref 150–400)
RDW: 12.8 % (ref 11.5–15.5)

## 2012-12-16 LAB — GLUCOSE, CAPILLARY: Glucose-Capillary: 175 mg/dL — ABNORMAL HIGH (ref 70–99)

## 2012-12-16 MED ORDER — ASPIRIN 81 MG PO TBEC: 81.0000 mg | DELAYED_RELEASE_TABLET | Freq: Every day | ORAL | Status: DC

## 2012-12-16 MED ORDER — MOXIFLOXACIN HCL 400 MG PO TABS: 400.0000 mg | ORAL_TABLET | Freq: Every day | ORAL | Status: DC

## 2012-12-16 MED ORDER — PRASUGREL HCL 10 MG PO TABS: 10.0000 mg | ORAL_TABLET | Freq: Every day | ORAL | Status: DC

## 2012-12-16 MED ORDER — GUAIFENESIN-DM 100-10 MG/5ML PO SYRP
5.0000 mL | ORAL_SOLUTION | ORAL | Status: DC | PRN
  Administered 2012-12-16: 04:00:00 5 mL via ORAL
  Filled 2012-12-16: qty 5

## 2012-12-16 MED FILL — Sodium Chloride IV Soln 0.9%: INTRAVENOUS | Qty: 50 | Status: AC

## 2012-12-16 NOTE — Progress Notes (Signed)
CARDIAC REHAB PHASE I   PRE:  Rate/Rhythm: 70SR  BP:  Supine: 151/93  Sitting:   Standing:    SaO2:   MODE:  Ambulation: 1000 ft   POST:  Rate/Rhythm: 87SR  BP:  Supine:   Sitting: 167/90  Standing:    SaO2:  0745-0905 Pt walked 1000 ft with steady gait. No CP. Tolerated well. Education completed with pt and wife. Understanding voice. Discussed CRP 2 and permission given to refer to [place].    Luetta Nutting, RN BSN  12/16/2012 8:59 AM

## 2012-12-16 NOTE — Discharge Summary (Signed)
Physician Discharge Summary  Patient ID: [name] MRN: [id number] DOB/AGE: 02-May-1950 62 y.o.  Admit date: 12/15/2012 Discharge date: 12/16/2012  Primary Discharge Diagnosis 1. Coronary artery disease of the native coronary arteries 2. PTCA status 3. Angina pectoris Secondary Discharge Diagnosis 1. Hyperlipidemia 2. Diabetes mellitus type 2 controlled 3. Acute bronchitis, patient started developing cough one day prior to admission. Patient started on Avelox for 5 days. 4. Thrombocytopenia, probably related to heparin administration yesterday, will be followed outpatient basis.  Significant Diagnostic Studies: [id number]: PTCA and stenting of the proximal and midsegment of the LAD with implantation of a 2.75 x 38 mm promos Premier drug-eluting stent for a high-grade 99% stenosis. Balloon angioplasty of the D1 segment of the LAD with a 2.75 x 20 mm Empera balloon. PTCA and balloon angioplasty of the distal LAD high-grade 90% stenosis with 2.0 x 15 mm mini trek balloon. PTCA and stenting of the distal RCA with implantation of a 2.75 x 24 mm promos Premier drug-eluting stent for a high-grade 99% stenosis of the distal RCA bifurcation into the PDA.  Hospital Course: Patient admitted an elective basis for coronary angiography due to abnormal stress test and class III angina pectoris in spite of aggressive medical therapy. Found to have high-grade multivessel coronary artery disease for which he underwent successful angioplasty and felt stable for discharge the following morning as he was asymptomatic without any EKG abnormalities. Patient also complained about the 10 bronchitis, states that he is predisposed to developing frequent bronchitis. Requests antibiotics.   Recommendations on discharge: Patient will be discharged home on home medications and aspirin dose was introduced to 81 mg by mouth daily and patient also started on Effient 10 mg by mouth daily. He'll be followed up in the outpatient  basis in 2 weeks. Patient complained about cough and her in the bronchitis, his physical exam was normal, I prescribed him Avelox as he states that his very easily probed of significant bronchitis.  Discharge Exam: Blood pressure 153/68, pulse 68, temperature 97.6 F (36.4 C), temperature source Oral, resp. rate 20, height 5\' 8"  (1.727 m), weight 81.3 kg (179 lb 3.7 oz), SpO2 97.00%.   General appearance: alert, cooperative, appears stated age and no distress Resp: clear to auscultation bilaterally Cardio: regular rate and rhythm, S1, S2 normal, no murmur, click, rub or gallop GI: soft, non-tender; bowel sounds normal; no masses,  no organomegaly Extremities: extremities normal, atraumatic, no cyanosis or edema Pulses: 2+ and symmetric Labs:   Lab Results  Component Value Date   WBC 7.0 12/16/2012   HGB 13.9 12/16/2012   HCT 38.4* 12/16/2012   MCV 85.0 12/16/2012   PLT 106* 12/16/2012    Recent Labs Lab 12/16/12 0410  NA 140  K 3.7  CL 105  CO2 25  BUN 14  CREATININE 0.92  CALCIUM 8.8  GLUCOSE 141*   Lab Results  Component Value Date   TROPONINI <0.30 11/12/2012    EKG: unchanged from previous tracings, EKG 12/16/2012: Normal sinus rhythm, poor progression, no evidence of ischemia. Normal intervals..    Radiology: US Abdomen Complete  11/24/2012   IMPRESSION: No abnormality of gallbladder or bile ducts is evident.  There is increased echogenicity of the hepatic parenchymal echotexture.  Most commonly this is associated with hepatic steatosis but is not specific for it. Hepatic steatosis was present on previous CT examination.  Simple appearing cyst of left kidney.   Original Report Authenticated By: Onalee Hua Call    FOLLOW UP PLANS AND APPOINTMENTS  Future Appointments Provider Department Dept Phone   01/08/2013 8:30 AM [facility] [phone]   01/12/2013 2:45 PM Kimber Relic, MD [facility] [phone]   03/10/2013 4:00 PM Kimber Relic,  MD [facility] [phone]       Medication List         aspirin 81 MG EC tablet  Take 1 tablet (81 mg total) by mouth daily.     atorvastatin 20 MG tablet  Commonly known as:  LIPITOR  Take 20 mg by mouth daily.     BD PEN NEEDLE NANO U/F 32G X 4 MM Misc  Generic drug:  Insulin Pen Needle  Use one needle daily with Lantus pen     carvedilol 6.25 MG tablet  Commonly known as:  COREG  Take 6.25 mg by mouth 2 (two) times daily with a meal.     Insulin Glargine 100 UNIT/ML Sopn  Commonly known as:  LANTUS SOLOSTAR  14 units in the morning and 20 units in the evening.     moxifloxacin 400 MG tablet  Commonly known as:  AVELOX  Take 1 tablet (400 mg total) by mouth daily.     nitroGLYCERIN 0.4 MG SL tablet  Commonly known as:  NITROSTAT  Place 0.4 mg under the tongue every 5 (five) minutes as needed for chest pain.     pantoprazole 40 MG tablet  Commonly known as:  PROTONIX  Take 1 tablet (40 mg total) by mouth 2 (two) times daily.     pramipexole 0.75 MG tablet  Commonly known as:  MIRAPEX  Take 0.375-0.75 mg by mouth at bedtime. For restless leg dose depends on severity of pain.     prasugrel 10 MG Tabs tablet  Commonly known as:  EFFIENT  Take 1 tablet (10 mg total) by mouth daily.          Pamella Pert, MD 12/16/2012, 8:08 AM  Pager: [phone] Office: [phone] If no answer: [phone]

## 2012-12-20 ENCOUNTER — Encounter (HOSPITAL_COMMUNITY): Payer: Self-pay | Admitting: Emergency Medicine

## 2012-12-20 ENCOUNTER — Observation Stay (HOSPITAL_COMMUNITY)
Admission: EM | Admit: 2012-12-20 | Discharge: 2012-12-21 | Disposition: A | Discharge status: Home / Self Care | Payer: BC Managed Care – PPO | Attending: Cardiology | Admitting: Cardiology

## 2012-12-20 ENCOUNTER — Other Ambulatory Visit: Payer: Self-pay

## 2012-12-20 ENCOUNTER — Emergency Department (HOSPITAL_COMMUNITY): Payer: BC Managed Care – PPO

## 2012-12-20 DIAGNOSIS — I10 Essential (primary) hypertension: Secondary | ICD-10-CM | POA: Insufficient documentation

## 2012-12-20 DIAGNOSIS — E785 Hyperlipidemia, unspecified: Secondary | ICD-10-CM | POA: Insufficient documentation

## 2012-12-20 DIAGNOSIS — R5381 Other malaise: Secondary | ICD-10-CM | POA: Insufficient documentation

## 2012-12-20 DIAGNOSIS — K2971 Gastritis, unspecified, with bleeding: Secondary | ICD-10-CM | POA: Insufficient documentation

## 2012-12-20 DIAGNOSIS — R072 Precordial pain: Principal | ICD-10-CM | POA: Insufficient documentation

## 2012-12-20 DIAGNOSIS — G44209 Tension-type headache, unspecified, not intractable: Secondary | ICD-10-CM | POA: Insufficient documentation

## 2012-12-20 DIAGNOSIS — Z9861 Coronary angioplasty status: Secondary | ICD-10-CM | POA: Insufficient documentation

## 2012-12-20 DIAGNOSIS — E119 Type 2 diabetes mellitus without complications: Secondary | ICD-10-CM | POA: Insufficient documentation

## 2012-12-20 DIAGNOSIS — K219 Gastro-esophageal reflux disease without esophagitis: Secondary | ICD-10-CM | POA: Insufficient documentation

## 2012-12-20 DIAGNOSIS — G2581 Restless legs syndrome: Secondary | ICD-10-CM | POA: Insufficient documentation

## 2012-12-20 DIAGNOSIS — IMO0001 Reserved for inherently not codable concepts without codable children: Secondary | ICD-10-CM

## 2012-12-20 DIAGNOSIS — K2270 Barrett's esophagus without dysplasia: Secondary | ICD-10-CM | POA: Insufficient documentation

## 2012-12-20 DIAGNOSIS — E1169 Type 2 diabetes mellitus with other specified complication: Secondary | ICD-10-CM | POA: Insufficient documentation

## 2012-12-20 DIAGNOSIS — R011 Cardiac murmur, unspecified: Secondary | ICD-10-CM | POA: Insufficient documentation

## 2012-12-20 DIAGNOSIS — N529 Male erectile dysfunction, unspecified: Secondary | ICD-10-CM | POA: Insufficient documentation

## 2012-12-20 DIAGNOSIS — J4489 Other specified chronic obstructive pulmonary disease: Secondary | ICD-10-CM | POA: Insufficient documentation

## 2012-12-20 DIAGNOSIS — J449 Chronic obstructive pulmonary disease, unspecified: Secondary | ICD-10-CM | POA: Insufficient documentation

## 2012-12-20 DIAGNOSIS — K429 Umbilical hernia without obstruction or gangrene: Secondary | ICD-10-CM | POA: Insufficient documentation

## 2012-12-20 DIAGNOSIS — R634 Abnormal weight loss: Secondary | ICD-10-CM | POA: Insufficient documentation

## 2012-12-20 DIAGNOSIS — M542 Cervicalgia: Secondary | ICD-10-CM | POA: Insufficient documentation

## 2012-12-20 DIAGNOSIS — R079 Chest pain, unspecified: Secondary | ICD-10-CM

## 2012-12-20 LAB — BASIC METABOLIC PANEL
BUN: 18 mg/dL (ref 6–23)
CO2: 28 mEq/L (ref 19–32)
Calcium: 9.5 mg/dL (ref 8.4–10.5)
Chloride: 98 mEq/L (ref 96–112)
Creatinine, Ser: 1.04 mg/dL (ref 0.50–1.35)
GFR calc Af Amer: 86 mL/min — ABNORMAL LOW (ref >90)
Glucose, Bld: 140 mg/dL — ABNORMAL HIGH (ref 70–99)
Potassium: 3.8 mEq/L (ref 3.5–5.1)

## 2012-12-20 LAB — CBC
HCT: 40.4 % (ref 39.0–52.0)
Hemoglobin: 14.7 g/dL (ref 13.0–17.0)
MCH: 31.1 pg (ref 26.0–34.0)
MCV: 85.4 fL (ref 78.0–100.0)
Platelets: 124 10*3/uL — ABNORMAL LOW (ref 150–400)
RDW: 12.7 % (ref 11.5–15.5)
WBC: 6.3 10*3/uL (ref 4.0–10.5)

## 2012-12-20 LAB — TROPONIN I: Troponin I: <0.3 ng/mL (ref <0.30)

## 2012-12-20 MED ORDER — ONDANSETRON HCL 4 MG/2ML IJ SOLN: 4.0000 mg | Freq: Four times a day (QID) | INTRAMUSCULAR | Status: DC | PRN

## 2012-12-20 MED ORDER — NITROGLYCERIN 0.4 MG SL SUBL: 0.4000 mg | SUBLINGUAL_TABLET | SUBLINGUAL | Status: DC | PRN

## 2012-12-20 MED ORDER — ACETAMINOPHEN 325 MG PO TABS
650.0000 mg | ORAL_TABLET | ORAL | Status: DC | PRN
  Administered 2012-12-20: 650 mg via ORAL
  Filled 2012-12-20: qty 2

## 2012-12-20 MED ORDER — ASPIRIN 300 MG RE SUPP
300.0000 mg | RECTAL | Status: AC
  Filled 2012-12-20: qty 1

## 2012-12-20 MED ORDER — INSULIN ASPART 100 UNIT/ML ~~LOC~~ SOLN: 0.0000 [IU] | Freq: Three times a day (TID) | SUBCUTANEOUS | Status: DC

## 2012-12-20 MED ORDER — ATORVASTATIN CALCIUM 20 MG PO TABS
20.0000 mg | ORAL_TABLET | Freq: Every day | ORAL | Status: DC
  Filled 2012-12-20: qty 1

## 2012-12-20 MED ORDER — HEPARIN BOLUS VIA INFUSION
4000.0000 [IU] | Freq: Once | INTRAVENOUS | Status: AC
  Administered 2012-12-20: 4000 [IU] via INTRAVENOUS
  Filled 2012-12-20: qty 4000

## 2012-12-20 MED ORDER — HEPARIN (PORCINE) IN NACL 100-0.45 UNIT/ML-% IJ SOLN
1100.0000 [IU]/h | INTRAMUSCULAR | Status: DC
  Administered 2012-12-20: 1100 [IU]/h via INTRAVENOUS
  Filled 2012-12-20 (×2): qty 250

## 2012-12-20 MED ORDER — ASPIRIN 81 MG PO CHEW
324.0000 mg | CHEWABLE_TABLET | ORAL | Status: AC
  Administered 2012-12-20: 324 mg via ORAL
  Filled 2012-12-20: qty 4

## 2012-12-20 MED ORDER — ASPIRIN EC 81 MG PO TBEC
81.0000 mg | DELAYED_RELEASE_TABLET | Freq: Every day | ORAL | Status: DC
  Filled 2012-12-20: qty 1

## 2012-12-20 MED ORDER — PRAMIPEXOLE DIHYDROCHLORIDE 0.25 MG PO TABS
0.3750 mg | ORAL_TABLET | Freq: Every day | ORAL | Status: DC
  Administered 2012-12-20: 0.375 mg via ORAL
  Filled 2012-12-20 (×2): qty 1

## 2012-12-20 MED ORDER — PANTOPRAZOLE SODIUM 40 MG PO TBEC
40.0000 mg | DELAYED_RELEASE_TABLET | Freq: Two times a day (BID) | ORAL | Status: DC
  Administered 2012-12-20: 40 mg via ORAL
  Filled 2012-12-20: qty 1

## 2012-12-20 MED ORDER — INSULIN GLARGINE 100 UNIT/ML ~~LOC~~ SOLN
8.0000 [IU] | Freq: Two times a day (BID) | SUBCUTANEOUS | Status: DC
  Administered 2012-12-20: 8 [IU] via SUBCUTANEOUS
  Filled 2012-12-20 (×3): qty 0.08

## 2012-12-20 MED ORDER — CARVEDILOL 6.25 MG PO TABS
6.2500 mg | ORAL_TABLET | Freq: Two times a day (BID) | ORAL | Status: DC
  Administered 2012-12-21: 6.25 mg via ORAL
  Filled 2012-12-20 (×3): qty 1

## 2012-12-20 MED ORDER — NITROGLYCERIN IN D5W 200-5 MCG/ML-% IV SOLN
5.0000 ug/min | INTRAVENOUS | Status: DC
  Administered 2012-12-20: 5 ug/min via INTRAVENOUS
  Filled 2012-12-20: qty 250

## 2012-12-20 MED ORDER — PRASUGREL HCL 10 MG PO TABS
10.0000 mg | ORAL_TABLET | Freq: Every day | ORAL | Status: DC
  Filled 2012-12-20: qty 1

## 2012-12-20 MED ORDER — ASPIRIN EC 81 MG PO TBEC: 81.0000 mg | DELAYED_RELEASE_TABLET | Freq: Every day | ORAL | Status: DC

## 2012-12-20 MED ORDER — PRAMIPEXOLE DIHYDROCHLORIDE 0.75 MG PO TABS: 0.3750 mg | ORAL_TABLET | Freq: Every day | ORAL | Status: DC

## 2012-12-20 NOTE — ED Notes (Signed)
Attempted report 

## 2012-12-20 NOTE — ED Provider Notes (Signed)
CSN: [id number]     Arrival date & time 12/20/12  1813 History   First MD Initiated Contact with Patient 12/20/12 1819     Chief Complaint  Patient presents with  . Chest Pain   (Consider location/radiation/quality/duration/timing/severity/associated sxs/prior Treatment) Patient is a 62 y.o. male presenting with chest pain.  Chest Pain Pain location:  Substernal area Pain quality: pressure   Pain radiates to:  Does not radiate Pain radiates to the back: no   Pain severity:  Moderate Onset quality:  Sudden Duration:  1 hour Timing:  Constant Progression:  Partially resolved Chronicity:  New Context comment:  While singing Relieved by:  Nothing Worsened by:  Nothing tried Ineffective treatments:  None tried Associated symptoms: no abdominal pain, no back pain, no cough, no dizziness, no fever, no headache, no nausea, no shortness of breath and not vomiting     Past Medical History  Diagnosis Date  . Restless leg   . Cervicalgia   . Loss of weight   . Impotence of organic origin   . Umbilical hernia without mention of obstruction or gangrene   . Barrett's esophagus   . Unspecified gastritis and gastroduodenitis with hemorrhage   . Hyperlipidemia   . Other malaise and fatigue   . Hypoglycemia, unspecified   . GERD (gastroesophageal reflux disease)   . Complication of anesthesia     " i DO NOT WAKE UP VERY WELL "  . PONV (postoperative nausea and vomiting)   . Hypertension   . Heart murmur     "grew out of it" (12/15/2012)  . Chronic bronchitis     "not bad lately; used to get it 3-4 times/yr" (12/15/2012)  . IDDM (insulin dependent diabetes mellitus)   . H/O hiatal hernia   . Tension headache   . Headache     "weekly" (12/15/2012)  . COPD (chronic obstructive pulmonary disease)    Past Surgical History  Procedure Laterality Date  . Knee surgery Right 1992 and 2000    "calcium deposits removed" (12/15/2012)  . Coronary angioplasty with stent placement   12/15/2012    "2 stents" (12/15/2012)  . Hernia repair  2010    "umbilical" (12/15/2012)  . Incision and drainage abscess Left 05/2007    "groin" (12/15/2012)   History reviewed. No pertinent family history. History  Substance Use Topics  . Smoking status: Never Smoker   . Smokeless tobacco: Never Used  . Alcohol Use: No    Review of Systems  Constitutional: Negative for fever and chills.  HENT: Negative for congestion, rhinorrhea and sore throat.   Eyes: Negative for photophobia and visual disturbance.  Respiratory: Negative for cough and shortness of breath.   Cardiovascular: Positive for chest pain. Negative for leg swelling.  Gastrointestinal: Negative for nausea, vomiting, abdominal pain, diarrhea and constipation.  Endocrine: Negative for polydipsia and polyuria.  Genitourinary: Negative for dysuria and hematuria.  Musculoskeletal: Negative for arthralgias and back pain.  Skin: Negative for color change and rash.  Neurological: Negative for dizziness, syncope, light-headedness and headaches.  Hematological: Negative for adenopathy. Does not bruise/bleed easily.  All other systems reviewed and are negative.    Allergies  Review of patient's allergies indicates no known allergies.  Home Medications   No current outpatient prescriptions on file. BP 130/79  Pulse 59  Temp(Src) 97.6 F (36.4 C) (Oral)  Resp 18  Ht 5\' 8"  (1.727 m)  Wt 176 lb 14.4 oz (80.241 kg)  BMI 26.9 kg/m2  SpO2 97% Physical Exam  Vitals reviewed. Constitutional: He is oriented to person, place, and time. He appears well-developed and well-nourished.  HENT:  Head: Normocephalic and atraumatic.  Eyes: Conjunctivae and EOM are normal.  Neck: Normal range of motion. Neck supple.  Cardiovascular: Normal rate, regular rhythm and normal heart sounds.   Pulmonary/Chest: Effort normal and breath sounds normal. No respiratory distress.  Abdominal: He exhibits no distension. There is no tenderness.  There is no rebound and no guarding.  Musculoskeletal: Normal range of motion.  Neurological: He is alert and oriented to person, place, and time.  Skin: Skin is warm and dry.    ED Course  Procedures (including critical care time) Labs Review Labs Reviewed  CBC - Abnormal; Notable for the following:    MCHC 36.4 (*)    Platelets 124 (*)    All other components within normal limits  BASIC METABOLIC PANEL - Abnormal; Notable for the following:    Glucose, Bld 140 (*)    GFR calc non Af Amer 74 (*)    GFR calc Af Amer 86 (*)    All other components within normal limits  GLUCOSE, CAPILLARY - Abnormal; Notable for the following:    Glucose-Capillary 105 (*)    All other components within normal limits  TROPONIN I  HEPARIN LEVEL (UNFRACTIONATED)  CBC  TROPONIN I  TROPONIN I  TROPONIN I  PROTIME-INR  APTT  CBC WITH DIFFERENTIAL  COMPREHENSIVE METABOLIC PANEL  MAGNESIUM  LIPID PANEL  BASIC METABOLIC PANEL   Imaging Review Dg Chest 2 View  12/20/2012   CLINICAL DATA:  Chest pain. History of hypertension, diabetes and coronary stent placement.  EXAM: CHEST  2 VIEW  COMPARISON:  11/11/2012.  FINDINGS: The heart size and mediastinal contours are stable. Coronary artery stent is visualized. The lungs are clear. There is no pleural effusion or pneumothorax. No acute osseous findings are seen. Telemetry leads overlie the chest.  IMPRESSION: No active cardiopulmonary process.   Electronically Signed   By: Roxy Horseman M.D.   On: 12/20/2012 19:09    EKG Interpretation     Ventricular Rate:    PR Interval:    QRS Duration:   QT Interval:    QTC Calculation:   R Axis:     Text Interpretation:              Date: 12/20/2012  Rate: 69  Rhythm: normal sinus rhythm  QRS Axis: normal  Intervals: normal  ST/T Wave abnormalities: normal  Conduction Disutrbances: none  Narrative Interpretation: NSR, no ST changes  Old EKG Reviewed: No significant changes noted    MDM    1. Chest pain    62 y.o. male  with pertinent PMH of CAD sp stenting 4 days ago, DM presents with chest pain as described above. Patient states pain is similar to prior anginal symptoms, and he had a similar incident yesterday however it resolved with nitroglycerin. Today patient was acquire practice and again had similar pain strength 4/10, which was unresponsive to nitroglycerin. On arrival vital signs and EKG as above unremarkable. Physical exam noncontributory.   Labs as above without elevated troponin. Consulted cardiology.  Pt admitted in stable condition, heparin started.    Labs and imaging as above reviewed by myself and attending,Dr. Loretha Stapler, with whom case was discussed.   1. Chest pain         Noel Gerold, MD 12/20/12 2325

## 2012-12-20 NOTE — ED Notes (Signed)
Pt reports having mid chest tightness and heaviness since 5:30pm. No nausea or sob. Had stents placed wed. ekg done at triage, skin w/d. resp e/u.

## 2012-12-20 NOTE — ED Notes (Signed)
No changes, denies pain or sx other than restless legs, Dr. Sharyn Lull finished at [facility], VSS, up to 3W.

## 2012-12-20 NOTE — Progress Notes (Signed)
ANTICOAGULATION CONSULT NOTE - Initial Consult  Pharmacy Consult for heparin Indication: ACS/STEMI  No Known Allergies  Patient Measurements: Height: 5\' 8"  (172.7 cm) Weight: 179 lb 14.4 oz (81.602 kg) IBW/kg (Calculated) : 68.4 Heparin Dosing Weight: 81.6 kg  Vital Signs: Temp: 97.8 F (36.6 C) (10/19 1820) Temp src: Oral (10/19 1820) BP: 115/74 mmHg (10/19 1915) Pulse Rate: 60 (10/19 1915)  Labs:  Recent Labs  12/20/12 1827 12/20/12 1830  HGB 14.7  --   HCT 40.4  --   PLT 124*  --   CREATININE 1.04  --   TROPONINI  --  <0.30    Estimated Creatinine Clearance: 70.3 ml/min (by C-G formula based on Cr of 1.04).   Medical History: Past Medical History  Diagnosis Date  . Restless leg   . Cervicalgia   . Loss of weight   . Impotence of organic origin   . Umbilical hernia without mention of obstruction or gangrene   . Barrett's esophagus   . Unspecified gastritis and gastroduodenitis with hemorrhage   . Hyperlipidemia   . Other malaise and fatigue   . Hypoglycemia, unspecified   . GERD (gastroesophageal reflux disease)   . Complication of anesthesia     " i DO NOT WAKE UP VERY WELL "  . PONV (postoperative nausea and vomiting)   . Hypertension   . Heart murmur     "grew out of it" (12/15/2012)  . Chronic bronchitis     "not bad lately; used to get it 3-4 times/yr" (12/15/2012)  . IDDM (insulin dependent diabetes mellitus)   . H/O hiatal hernia   . Tension headache   . Headache     "weekly" (12/15/2012)  . COPD (chronic obstructive pulmonary disease)     Medications:  Scheduled:  Infusions:   Assessment: 62 yo male with ACS will be started on heparin therapy.  Hgb 14.7 and Plt 124 K.  Not on anticoag prior to admission. Goal of Therapy:  Heparin level 0.3-0.7 units/ml Monitor platelets by anticoagulation protocol: Yes   Plan:  1) Heparin 4000 units iv bolus x1, then start heparin drip at 1100 units/hr 2) 6hr heparin level 3) Daily heparin level  and CBC  Jasie Meleski, Tsz-Yin 12/20/2012,8:13 PM

## 2012-12-20 NOTE — ED Notes (Signed)
Pt alert, NAD, calm, interactive, admits to continued substernal CP, rates 4/10, (denies: sob, nausea, dizziness or other sx), results returned and reviewed, pending re-eval, wife at [facility], mentions restless leg sx, pt of Dr. [name], R radial artery approach to PCI stent earlier this week. Site u, bandage in place.

## 2012-12-20 NOTE — ED Provider Notes (Signed)
I saw and evaluated the patient, reviewed the resident's note and I agree with the findings and plan.  7:51 PM 62 yo male with recent coronary stenting presenting with chest pain which he reports as consistent with angina.  Pain free now.  Heart sounds normal with RRR.  Well appearing.  EKG normal.  Labwork reassuring.  Plan to discuss with cardiology.  EKG - sinus rhythm, rate 69, normal axis, normal intervals, no ST or T wave changes, compared to prior, rate increased.  Admitted to cards.   Clinical Impression: 1. Chest pain       Candyce Churn, MD 12/21/12 [phone]

## 2012-12-20 NOTE — ED Notes (Signed)
Preparing to go to 3W, Dr. Sharyn Lull (cards) into room

## 2012-12-20 NOTE — ED Notes (Signed)
Patient returned from XR. 

## 2012-12-20 NOTE — H&P (Signed)
[name] is an 62 y.o. male.   Chief Complaint: Recurrent chest pain HPI: Patient is 62 year old male with past medical history significant for coronary artery disease status post recent multivessel PCI on 12/15/2012 he had PTCA stenting to proximal LAD and PTCA to ostial diagonal and PTCA stenting to distal RCA/PDA with excellent results, insulin requiring diabetes mellitus, restless leg syndrome, chronic thrombocytopenia, history of gastritis, hyperlipidemia, came to the ER complaining of recurrent retrosternal chest pain sharp grade 5/10 associated with diaphoresis while singing  took 2 sublingual nitroglycerin without relief. States chest pain was similar in nature when he had PCI but not as severe. Patient states he had similar chest pain yesterday took 2 sublingual nitroglycerin with relief of chest pain. Patient denies any nausea vomiting but was diaphoretic earlier today. Denies any shortness of breath denies any PND orthopnea leg swelling. Denies any exertional chest pain although activity is limited since his PCI. EKG done in the ER showed no acute ischemic changes first set of troponin I. is normal.  Past Medical History  Diagnosis Date  . Restless leg   . Cervicalgia   . Loss of weight   . Impotence of organic origin   . Umbilical hernia without mention of obstruction or gangrene   . Barrett's esophagus   . Unspecified gastritis and gastroduodenitis with hemorrhage   . Hyperlipidemia   . Other malaise and fatigue   . Hypoglycemia, unspecified   . GERD (gastroesophageal reflux disease)   . Complication of anesthesia     " i DO NOT WAKE UP VERY WELL "  . PONV (postoperative nausea and vomiting)   . Hypertension   . Heart murmur     "grew out of it" (12/15/2012)  . Chronic bronchitis     "not bad lately; used to get it 3-4 times/yr" (12/15/2012)  . IDDM (insulin dependent diabetes mellitus)   . H/O hiatal hernia   . Tension headache   . Headache     "weekly" (12/15/2012)  .  COPD (chronic obstructive pulmonary disease)     Past Surgical History  Procedure Laterality Date  . Knee surgery Right 1992 and 2000    "calcium deposits removed" (12/15/2012)  . Coronary angioplasty with stent placement  12/15/2012    "2 stents" (12/15/2012)  . Hernia repair  2010    "umbilical" (12/15/2012)  . Incision and drainage abscess Left 05/2007    "groin" (12/15/2012)    History reviewed. No pertinent family history. Social History:  reports that he has never smoked. He has never used smokeless tobacco. He reports that he does not drink alcohol or use illicit drugs.  Allergies: No Known Allergies   (Not in a hospital admission)  Results for orders placed during the hospital encounter of 12/20/12 (from the past 48 hour(s))  CBC     Status: Abnormal   Collection Time    12/20/12  6:27 PM      Result Value Range   WBC 6.3  4.0 - 10.5 K/uL   RBC 4.73  4.22 - 5.81 MIL/uL   Hemoglobin 14.7  13.0 - 17.0 g/dL   HCT 16.1  09.6 - 04.5 %   MCV 85.4  78.0 - 100.0 fL   MCH 31.1  26.0 - 34.0 pg   MCHC 36.4 (*) 30.0 - 36.0 g/dL   RDW 40.9  81.1 - 91.4 %   Platelets 124 (*) 150 - 400 K/uL  BASIC METABOLIC PANEL     Status: Abnormal  Collection Time    12/20/12  6:27 PM      Result Value Range   Sodium 136  135 - 145 mEq/L   Potassium 3.8  3.5 - 5.1 mEq/L   Chloride 98  96 - 112 mEq/L   CO2 28  19 - 32 mEq/L   Glucose, Bld 140 (*) 70 - 99 mg/dL   BUN 18  6 - 23 mg/dL   Creatinine, Ser 1.61  0.50 - 1.35 mg/dL   Calcium 9.5  8.4 - 09.6 mg/dL   GFR calc non Af Amer 74 (*) >90 mL/min   GFR calc Af Amer 86 (*) >90 mL/min   Comment: (NOTE)     The eGFR has been calculated using the CKD EPI equation.     This calculation has not been validated in all clinical situations.     eGFR's persistently <90 mL/min signify possible Chronic Kidney     Disease.  TROPONIN I     Status: None   Collection Time    12/20/12  6:30 PM      Result Value Range   Troponin I <0.30  <0.30  ng/mL   Comment:            Due to the release kinetics of cTnI,     a negative result within the first hours     of the onset of symptoms does not rule out     myocardial infarction with certainty.     If myocardial infarction is still suspected,     repeat the test at appropriate intervals.   Dg Chest 2 View  12/20/2012   CLINICAL DATA:  Chest pain. History of hypertension, diabetes and coronary stent placement.  EXAM: CHEST  2 VIEW  COMPARISON:  11/11/2012.  FINDINGS: The heart size and mediastinal contours are stable. Coronary artery stent is visualized. The lungs are clear. There is no pleural effusion or pneumothorax. No acute osseous findings are seen. Telemetry leads overlie the chest.  IMPRESSION: No active cardiopulmonary process.   Electronically Signed   By: Roxy Horseman M.D.   On: 12/20/2012 19:09    Review of Systems  Constitutional: Negative for fever and chills.  HENT: Negative for hearing loss.   Eyes: Negative for blurred vision and double vision.  Cardiovascular: Positive for chest pain. Negative for palpitations, orthopnea and claudication.  Gastrointestinal: Negative for heartburn, nausea, vomiting, abdominal pain and diarrhea.  Genitourinary: Negative for dysuria.  Skin: Negative for rash.  Neurological: Negative for dizziness, tingling and headaches.    Blood pressure 129/95, pulse 68, temperature 97.8 F (36.6 C), temperature source Oral, resp. rate 16, height 5\' 8"  (1.727 m), weight 81.602 kg (179 lb 14.4 oz), SpO2 96.00%. Physical Exam  Constitutional: He is oriented to person, place, and time.  HENT:  Head: Normocephalic and atraumatic.  Eyes: Conjunctivae and EOM are normal. Pupils are equal, round, and reactive to light. Left eye exhibits no discharge. No scleral icterus.  Neck: Normal range of motion. Neck supple. No JVD present. No tracheal deviation present. No thyromegaly present.  Cardiovascular: Normal rate and regular rhythm.   Murmur (Soft  systolic murmur noted no S3 or S4 gallop) heard. Respiratory: Effort normal and breath sounds normal. No respiratory distress. He has no wheezes. He has no rales.  GI: Soft. Bowel sounds are normal. He exhibits no distension. There is no tenderness. There is no rebound and no guarding.  Musculoskeletal: He exhibits no edema and no tenderness.  Neurological: He is  alert and oriented to person, place, and time.     Assessment/Plan Recurrent chest pain worrisome for angina doubt subacute thrombosis in view of normal EKG Coronary artery disease status post multivessel PCI recently as above Elevated blood pressure Insulin requiring diabetes mellitus Hypercholesteremia GERD History of gastritis in the past Chronic thrombocytopenia Restless leg syndrome Plan As per orders We'll review old films Dr. [name] will discuss with patient regarding further management  Jaziyah Gradel N 12/20/2012, 9:16 PM

## 2012-12-21 ENCOUNTER — Encounter (HOSPITAL_COMMUNITY): Payer: Self-pay | Admitting: *Deleted

## 2012-12-21 LAB — COMPREHENSIVE METABOLIC PANEL
ALT: 28 U/L (ref 0–53)
Albumin: 4.2 g/dL (ref 3.5–5.2)
Alkaline Phosphatase: 53 U/L (ref 39–117)
BUN: 18 mg/dL (ref 6–23)
Chloride: 100 mEq/L (ref 96–112)
GFR calc Af Amer: >90 mL/min (ref >90)
Glucose, Bld: 115 mg/dL — ABNORMAL HIGH (ref 70–99)
Potassium: 3.3 mEq/L — ABNORMAL LOW (ref 3.5–5.1)
Sodium: 136 mEq/L (ref 135–145)
Total Bilirubin: 0.7 mg/dL (ref 0.3–1.2)
Total Protein: 7.2 g/dL (ref 6.0–8.3)

## 2012-12-21 LAB — DIFFERENTIAL
Basophils Relative: 0 % (ref 0–1)
Eosinophils Absolute: 0.1 10*3/uL (ref 0.0–0.7)
Eosinophils Relative: 2 % (ref 0–5)
Lymphs Abs: 1.3 10*3/uL (ref 0.7–4.0)
Monocytes Absolute: 0.5 10*3/uL (ref 0.1–1.0)
Monocytes Relative: 10 % (ref 3–12)
Neutro Abs: 3 10*3/uL (ref 1.7–7.7)

## 2012-12-21 LAB — GLUCOSE, CAPILLARY: Glucose-Capillary: 99 mg/dL (ref 70–99)

## 2012-12-21 LAB — CBC
HCT: 37.4 % — ABNORMAL LOW (ref 39.0–52.0)
Hemoglobin: 13.2 g/dL (ref 13.0–17.0)
MCH: 30.1 pg (ref 26.0–34.0)
MCHC: 35.3 g/dL (ref 30.0–36.0)
MCV: 85.2 fL (ref 78.0–100.0)
WBC: 4.9 10*3/uL (ref 4.0–10.5)

## 2012-12-21 LAB — HEPARIN LEVEL (UNFRACTIONATED): Heparin Unfractionated: 0.55 IU/mL (ref 0.30–0.70)

## 2012-12-21 LAB — LIPID PANEL
HDL: 33 mg/dL — ABNORMAL LOW (ref >39)
LDL Cholesterol: 51 mg/dL (ref 0–99)
Total CHOL/HDL Ratio: 3.5 RATIO

## 2012-12-21 LAB — BASIC METABOLIC PANEL
BUN: 18 mg/dL (ref 6–23)
Calcium: 8.9 mg/dL (ref 8.4–10.5)
Chloride: 102 mEq/L (ref 96–112)
Glucose, Bld: 192 mg/dL — ABNORMAL HIGH (ref 70–99)
Potassium: 3.6 mEq/L (ref 3.5–5.1)
Sodium: 137 mEq/L (ref 135–145)

## 2012-12-21 LAB — MRSA PCR SCREENING: MRSA by PCR: NEGATIVE

## 2012-12-21 LAB — MAGNESIUM: Magnesium: 2 mg/dL (ref 1.5–2.5)

## 2012-12-21 MED ORDER — BD PEN NEEDLE NANO U/F 32G X 4 MM MISC: Status: DC

## 2012-12-21 MED ORDER — INSULIN GLARGINE 100 UNIT/ML SOLOSTAR PEN: PEN_INJECTOR | SUBCUTANEOUS | Status: DC

## 2012-12-21 NOTE — Progress Notes (Signed)
ANTICOAGULATION CONSULT NOTE - Follow Up Consult  Pharmacy Consult for heparin Indication: ACS/STEMI  No Known Allergies  Patient Measurements: Height: 5\' 8"  (172.7 cm) Weight: 176 lb 14.4 oz (80.241 kg) IBW/kg (Calculated) : 68.4 Heparin Dosing Weight: 81.6 kg  Vital Signs: Temp: 97.6 F (36.4 C) (10/19 2200) Temp src: Oral (10/19 1820) BP: 130/79 mmHg (10/19 2200) Pulse Rate: 59 (10/19 2200)  Labs:  Recent Labs  12/20/12 1827 12/20/12 1830 12/20/12 2309 12/21/12 0005 12/21/12 0235 12/21/12 0240  HGB 14.7  --   --   --   --  13.2  HCT 40.4  --   --   --   --  37.4*  PLT 124*  --   --   --   --  99*  HEPARINUNFRC  --   --   --   --   --  0.55  CREATININE 1.04  --   --  0.97 0.97  --   TROPONINI  --  <0.30 <0.30  --   --  <0.30    Estimated Creatinine Clearance: 75.4 ml/min (by C-G formula based on Cr of 0.97).   Medical History: Past Medical History  Diagnosis Date  . Restless leg   . Cervicalgia   . Loss of weight   . Impotence of organic origin   . Umbilical hernia without mention of obstruction or gangrene   . Barrett's esophagus   . Unspecified gastritis and gastroduodenitis with hemorrhage   . Hyperlipidemia   . Other malaise and fatigue   . Hypoglycemia, unspecified   . GERD (gastroesophageal reflux disease)   . Complication of anesthesia     " i DO NOT WAKE UP VERY WELL "  . PONV (postoperative nausea and vomiting)   . Hypertension   . Heart murmur     "grew out of it" (12/15/2012)  . Chronic bronchitis     "not bad lately; used to get it 3-4 times/yr" (12/15/2012)  . IDDM (insulin dependent diabetes mellitus)   . H/O hiatal hernia   . Tension headache   . Headache     "weekly" (12/15/2012)  . COPD (chronic obstructive pulmonary disease)     Medications:  Scheduled:  Infusions: Heparin infusion at 1100 units/ml  Assessment: 62 yo male with ACS on heparin.  Initial heparin level is therapeutic.  Chronic thrombocytopenia noted.   Goal  of Therapy:  Heparin level 0.3-0.7 units/ml Monitor platelets by anticoagulation protocol: Yes   Plan:  1) Cont heparin drip at 1100 units/hr 2) 6hr heparin level to confirm   Mattelyn Imhoff Poteet 12/21/2012,3:32 AM

## 2012-12-21 NOTE — Discharge Summary (Addendum)
Physician Discharge Summary  Patient ID: [name] MRN: [id number] DOB/AGE: 08-09-1950 62 y.o.  Admit date: 12/20/2012 Discharge date: 12/21/2012  Primary Discharge Diagnosis Chest pain, probably due to GERD. Secondary Discharge Diagnosis CAD, history of PTCA and stenting to the proximal and mid LAD and distal RCA and balloon angioplasty to the diagonal 1 branch of LAD and distal LAD on 12/15/2012: Diabetes mellitus type I Hyperlipidemia   Hospital Course:  Patient was admitted to the hospital with chest pain similar to his angina pectoris. Patient states that he's been walking in the house and in the neighborhood without any discomfort although 1 year singing he had episode of chest pain which was markedly with sublingual nitroglycerin, he also felt mildly diaphoretic, given recent angioplasty, he presented to emergency room. He was ruled out for myocardial infarction. EKG essentially remains normal sinus rhythm without any evidence of ischemia. Hence the chest pain was felt to be noncardiac given the fact that the chest pain was at rest and patient has been exerting himself and walking without any chest pain. Hence felt stable for discharge.  Recommendations on discharge:  To reassure patient, I will set him up for outpatient routine treadmill exercise stress test to evaluate his functional status. We will continue his present medical regimen.  Discharge Exam: Blood pressure 122/78, pulse 56, temperature 97.5 F (36.4 C), temperature source Oral, resp. rate 18, height 5\' 8"  (1.727 m), weight 80.241 kg (176 lb 14.4 oz), SpO2 97.00%.   General appearance: alert, cooperative, appears stated age and no distress  Resp: clear to auscultation bilaterally  Cardio: regular rate and rhythm, S1, S2 normal, no murmur, click, rub or gallop  GI: soft, non-tender; bowel sounds normal; no masses, no organomegaly  Extremities: extremities normal, atraumatic, no cyanosis or edema  Pulses: 2+ and  symmetric  Labs:   Lab Results  Component Value Date   WBC 4.9 12/21/2012   HGB 13.2 12/21/2012   HCT 37.4* 12/21/2012   MCV 85.2 12/21/2012   PLT 99* 12/21/2012    Recent Labs Lab 12/21/12 0005 12/21/12 0235  NA 136 137  K 3.3* 3.6  CL 100 102  CO2 25 25  BUN 18 18  CREATININE 0.97 0.97  CALCIUM 9.4 8.9  PROT 7.2  --   BILITOT 0.7  --   ALKPHOS 53  --   ALT 28  --   AST 29  --   GLUCOSE 115* 192*   Lab Results  Component Value Date   TROPONINI <0.30 12/21/2012    Lipid Panel     Component Value Date/Time   CHOL 116 12/21/2012 0240   TRIG 160* 12/21/2012 0240   HDL 33* 12/21/2012 0240   CHOLHDL 3.5 12/21/2012 0240   VLDL 32 12/21/2012 0240   LDLCALC 51 12/21/2012 0240    EKG: 12/20/2012 and 12/21/9012: Normal sinus rhythm, normal intervals. No density at normal EKG. No change from prior EKG.   Radiology: Dg Chest 2 View  12/20/2012   CLINICAL DATA:  Chest pain. History of hypertension, diabetes and coronary stent placement.  EXAM: CHEST  2 VIEW  COMPARISON:  11/11/2012.  FINDINGS: The heart size and mediastinal contours are stable. Coronary artery stent is visualized. The lungs are clear. There is no pleural effusion or pneumothorax. No acute osseous findings are seen. Telemetry leads overlie the chest.  IMPRESSION: No active cardiopulmonary process.   Electronically Signed   By: Roxy Horseman M.D.   On: 12/20/2012 19:09   US Abdomen Complete  11/24/2012   *RADIOLOGY REPORT*  Clinical Data:  History of chest pain.  History of gallstones. History of hepatic steatosis.  ABDOMINAL ULTRASOUND COMPLETE  IMPRESSION: No abnormality of gallbladder or bile ducts is evident.  There is increased echogenicity of the hepatic parenchymal echotexture.  Most commonly this is associated with hepatic steatosis but is not specific for it. Hepatic steatosis was present on previous CT examination.  Simple appearing cyst of left kidney.   Original Report Authenticated By: Onalee Hua Call       FOLLOW UP PLANS AND APPOINTMENTS  Future Appointments Provider Department Dept Phone   01/08/2013 8:30 AM [facility] [phone]   01/12/2013 2:45 PM Kimber Relic, MD [facility] [phone]   03/10/2013 4:00 PM Kimber Relic, MD [facility] [phone]       Medication List         aspirin 81 MG EC tablet  Take 1 tablet (81 mg total) by mouth daily.     atorvastatin 20 MG tablet  Commonly known as:  LIPITOR  Take 20 mg by mouth daily.     BD PEN NEEDLE NANO U/F 32G X 4 MM Misc  Generic drug:  Insulin Pen Needle  Use one needle daily with Lantus pen     carvedilol 6.25 MG tablet  Commonly known as:  COREG  Take 6.25 mg by mouth 2 (two) times daily with a meal.     Insulin Glargine 100 UNIT/ML Sopn  Commonly known as:  LANTUS SOLOSTAR  14 units in the morning and 20 units in the evening.     nitroGLYCERIN 0.4 MG SL tablet  Commonly known as:  NITROSTAT  Place 0.4 mg under the tongue every 5 (five) minutes as needed for chest pain.     pantoprazole 40 MG tablet  Commonly known as:  PROTONIX  Take 1 tablet (40 mg total) by mouth 2 (two) times daily.     pramipexole 0.75 MG tablet  Commonly known as:  MIRAPEX  Take 0.375-0.75 mg by mouth at bedtime. For restless leg dose depends on severity of pain.     prasugrel 10 MG Tabs tablet  Commonly known as:  EFFIENT  Take 1 tablet (10 mg total) by mouth daily.           Follow-up Information   Follow up with Pamella Pert, MD. (keep previous appointment)    Specialty:  Cardiology   Contact information:   [address] [place] [address] [phone]        Pamella Pert, MD 12/21/2012, 8:24 AM  Pager: [phone] Office: [phone] If no answer: [phone]

## 2012-12-21 NOTE — ED Provider Notes (Signed)
I saw and evaluated the patient, reviewed the resident's note and I agree with the findings and plan.    Candyce Churn, MD 12/21/12 [phone]

## 2012-12-21 NOTE — Progress Notes (Signed)
DC orders received.  Patient stable with no S/S of distress.  Medication and discharge information reviewed with patient and patient's wife.  Patient DC home. Lin Hackmann Marie  

## 2013-01-07 ENCOUNTER — Other Ambulatory Visit: Payer: Self-pay | Admitting: *Deleted

## 2013-01-07 DIAGNOSIS — E119 Type 2 diabetes mellitus without complications: Secondary | ICD-10-CM

## 2013-01-07 DIAGNOSIS — E785 Hyperlipidemia, unspecified: Secondary | ICD-10-CM

## 2013-01-08 ENCOUNTER — Other Ambulatory Visit: Payer: BC Managed Care – PPO

## 2013-01-12 ENCOUNTER — Ambulatory Visit: Payer: BC Managed Care – PPO | Admitting: Internal Medicine

## 2013-01-22 ENCOUNTER — Other Ambulatory Visit: Payer: Self-pay | Admitting: Nurse Practitioner

## 2013-01-25 ENCOUNTER — Other Ambulatory Visit: Payer: Self-pay

## 2013-01-25 ENCOUNTER — Other Ambulatory Visit: Payer: Self-pay | Admitting: Internal Medicine

## 2013-01-25 MED ORDER — PANTOPRAZOLE SODIUM 40 MG PO TBEC: DELAYED_RELEASE_TABLET | ORAL | Status: DC

## 2013-01-26 ENCOUNTER — Encounter: Payer: Self-pay | Admitting: Internal Medicine

## 2013-01-26 ENCOUNTER — Other Ambulatory Visit: Payer: Self-pay | Admitting: Internal Medicine

## 2013-02-04 ENCOUNTER — Encounter (HOSPITAL_COMMUNITY)
Admission: RE | Admit: 2013-02-04 | Discharge: 2013-02-04 | Disposition: A | Discharge status: Home / Self Care | Payer: BC Managed Care – PPO | Source: Ambulatory Visit | Attending: Cardiology | Admitting: Cardiology

## 2013-02-04 DIAGNOSIS — I2510 Atherosclerotic heart disease of native coronary artery without angina pectoris: Secondary | ICD-10-CM | POA: Insufficient documentation

## 2013-02-04 DIAGNOSIS — Z9861 Coronary angioplasty status: Secondary | ICD-10-CM | POA: Insufficient documentation

## 2013-02-04 DIAGNOSIS — Z7902 Long term (current) use of antithrombotics/antiplatelets: Secondary | ICD-10-CM | POA: Insufficient documentation

## 2013-02-04 NOTE — Progress Notes (Signed)
Cardiac Rehab Medication Review by a Pharmacist  Does the patient  feel that his/her medications are working for him/her?  yes  Has the patient been experiencing any side effects to the medications prescribed?  Not really, but maybe more headaches  Does the patient measure his/her own blood pressure or blood glucose at home?  Glucose, but not BP  Does the patient have any problems obtaining medications due to transportation or finances?   no  Understanding of regimen: excellent Understanding of indications: excellent Potential of compliance: good    Pharmacist comments: [name] is a 62 yo M presenting to Cardiac Rehab.  He did not have any meds with him this morning, but did have a list of all his active meds.  He takes all his meds in the morning, and the protonix and coreg he also takes at night.  He does not understand why his physician increased his protonix to twice a day.  His symptoms have not gotten worse and once daily has always controlled his acid reflux.  He was told at one time he has Barrett's esophagus.    The main concern he had about his meds this morning was his glucose control.  He is currently only on basal insulin (lantus).  He checks his sugars usually once daily in the morning, and if its too high he will give himself a couple more units of the long acting insulin.  I discussed the different type of insulins and asked him if he ever did meal-time coverage, which he has not.  Yesterday, his CBG was ~170 and this morning it was ~240 (self-reported) -He reports taking a couple extra units of lantus this AM.    His Mirapex for restless legs, he will take an extra one or two half tabs depending on how active he was during the day and his symptoms (says instructed by physician to do so).  He does not check his BP at home.  The only side effects he has noticed is possibly more headaches since starting all these meds.  [name], PharmD Clinical Pharmacist -  Resident Pager: [phone] Pharmacy: [phone] 02/04/2013 8:49 AM

## 2013-02-08 ENCOUNTER — Encounter (HOSPITAL_COMMUNITY)
Admission: RE | Admit: 2013-02-08 | Discharge: 2013-02-08 | Disposition: A | Discharge status: Home / Self Care | Payer: BC Managed Care – PPO | Source: Ambulatory Visit | Attending: Cardiology | Admitting: Cardiology

## 2013-02-08 NOTE — Progress Notes (Signed)
Pt in today for his first day of exercise at 6:45 exercise class time.  Pt tolerated light exercise with no complaints.  Monitor showed Sr with no ectopy.  PHQ2 score 0.  Pt express positive coping skills and feels positive regarding his outlook.

## 2013-02-10 ENCOUNTER — Encounter (HOSPITAL_COMMUNITY)
Admission: RE | Admit: 2013-02-10 | Discharge: 2013-02-10 | Disposition: A | Discharge status: Home / Self Care | Payer: BC Managed Care – PPO | Source: Ambulatory Visit | Attending: Cardiology | Admitting: Cardiology

## 2013-02-10 LAB — GLUCOSE, CAPILLARY: Glucose-Capillary: 201 mg/dL — ABNORMAL HIGH (ref 70–99)

## 2013-02-12 ENCOUNTER — Encounter (HOSPITAL_COMMUNITY)
Admission: RE | Admit: 2013-02-12 | Discharge: 2013-02-12 | Disposition: A | Discharge status: Home / Self Care | Payer: BC Managed Care – PPO | Source: Ambulatory Visit | Attending: Cardiology | Admitting: Cardiology

## 2013-02-12 LAB — GLUCOSE, CAPILLARY
Glucose-Capillary: 151 mg/dL — ABNORMAL HIGH (ref 70–99)
Glucose-Capillary: 108 mg/dL — ABNORMAL HIGH (ref 70–99)

## 2013-02-15 ENCOUNTER — Encounter (HOSPITAL_COMMUNITY)
Admission: RE | Admit: 2013-02-15 | Discharge: 2013-02-15 | Disposition: A | Discharge status: Home / Self Care | Payer: BC Managed Care – PPO | Source: Ambulatory Visit | Attending: Cardiology | Admitting: Cardiology

## 2013-02-15 NOTE — Progress Notes (Signed)
[name] 62 y.o. male Nutrition Note Spoke with pt.  Nutrition Plan and Nutrition Survey goals reviewed with pt. Pt is following Step 1 of the Therapeutic Lifestyle Changes diet. Pt wants to lose wt "but I'm going in the wrong direction." Wt loss tips reviewed.  Pt is diabetic. Last A1c indicates blood glucose poorly controlled. Pt checks fasting CBG's daily. Per pt, fasting CBG's 160-284 mg/dL and "I adjust my Lantus depending on my blood sugar." Pt takes 14-20 units of Lantus daily. Pt not eating carbs with breakfast before exercise "because of my blood sugar." Pt ate 2 eggs and bacon this morning. Pt pre-exercise breakfast choice discussed and alternatives offered. Pt encouraged to eat a balanced breakfast and have medications adjusted to healthy eating habits. This Clinical research associate went over Diabetes Education test results. Pt eats out frequently "because it's easier to eat out for just 2 people." Ways to make healthier choices when eating out discussed. Pt expressed understanding of the information reviewed. Pt aware of nutrition education classes offered and plans on attending nutrition classes. Lab Results  Component Value Date   HGBA1C 8.5* 10/05/2012   Nutrition Diagnosis   Food-and nutrition-related knowledge deficit related to lack of exposure to information as related to diagnosis of: ? CVD ? DM   Overweight related to excessive energy intake as evidenced by a BMI of 27.5  Nutrition RX/ Estimated Daily Nutrition Needs for: wt loss  1400-1900 Kcal, 35-50 gm fat, 9-13 gm sat fat, 1.4-1.9 gm trans-fat, <1500 mg sodium, 175-250 gm CHO   Nutrition Intervention   Pt's individual nutrition plan reviewed with pt.   Pt is going to try 20 units of Lantus daily to determine if fasting CBG's improve with consistent dose.   Benefits of adopting Therapeutic Lifestyle Changes discussed when Medficts reviewed.   Pt to attend the Portion Distortion class   Pt to attend the  ? Nutrition I class       ? Nutrition II class        ? Diabetes Blitz class       ? Diabetes Q & A class   Continue client-centered nutrition education by RD, as part of interdisciplinary care. Goal(s)   Pt to identify and limit food sources of saturated fat, trans fat, and cholesterol   Pt to identify food quantities necessary to achieve: ? wt loss to a goal wt of 158-176 lb (71.6-79.8 kg) at graduation from cardiac rehab.    CBG concentrations in the normal range or as close to normal as is safely possible. Monitor and Evaluate progress toward nutrition goal with team. Mickle Plumb, M.Ed, RD, LDN, CDE 02/15/2013 8:53 AM

## 2013-02-17 ENCOUNTER — Encounter (HOSPITAL_COMMUNITY): Payer: BC Managed Care – PPO

## 2013-02-19 ENCOUNTER — Encounter (HOSPITAL_COMMUNITY)
Admission: RE | Admit: 2013-02-19 | Discharge: 2013-02-19 | Disposition: A | Discharge status: Home / Self Care | Payer: BC Managed Care – PPO | Source: Ambulatory Visit | Attending: Cardiology | Admitting: Cardiology

## 2013-02-22 ENCOUNTER — Encounter (HOSPITAL_COMMUNITY)
Admission: RE | Admit: 2013-02-22 | Discharge: 2013-02-22 | Disposition: A | Discharge status: Home / Self Care | Payer: BC Managed Care – PPO | Source: Ambulatory Visit | Attending: Cardiology | Admitting: Cardiology

## 2013-02-22 ENCOUNTER — Other Ambulatory Visit: Payer: Self-pay | Admitting: Internal Medicine

## 2013-02-24 ENCOUNTER — Encounter (HOSPITAL_COMMUNITY)
Admission: RE | Admit: 2013-02-24 | Discharge: 2013-02-24 | Disposition: A | Discharge status: Home / Self Care | Payer: BC Managed Care – PPO | Source: Ambulatory Visit | Attending: Cardiology | Admitting: Cardiology

## 2013-02-24 NOTE — Progress Notes (Signed)
Reviewed home exercise with pt today.  Pt plans to continue his walking regimen for 30-45 minutes, 2-4 days per week outside of CRP II for exercise.  He will also completed one extra day of hand weight exercises on days outside of CRP II.   Reviewed THR, pulse, RPE, sign and symptoms, NTG use, and when to call 911 or MD.  Pt voiced understanding.  Alexia Freestone, MS, ACSM RCEP [phone] 02/24/2013

## 2013-03-01 ENCOUNTER — Encounter (HOSPITAL_COMMUNITY)
Admission: RE | Admit: 2013-03-01 | Discharge: 2013-03-01 | Disposition: A | Discharge status: Home / Self Care | Payer: BC Managed Care – PPO | Source: Ambulatory Visit | Attending: Cardiology | Admitting: Cardiology

## 2013-03-03 ENCOUNTER — Encounter (HOSPITAL_COMMUNITY)
Admission: RE | Admit: 2013-03-03 | Discharge: 2013-03-03 | Disposition: A | Discharge status: Home / Self Care | Payer: BC Managed Care – PPO | Source: Ambulatory Visit | Attending: Cardiology | Admitting: Cardiology

## 2013-03-03 NOTE — Progress Notes (Signed)
Pt hypertensive at cardiac rehab today.  Peak BP- 180/94. Workloads decreased with improvement in bp.  Asymptomatic.  Pt reports he has been out of carvedilol for 2 days and plans to pick up at pharmacy today.

## 2013-03-05 ENCOUNTER — Encounter (HOSPITAL_COMMUNITY)
Admission: RE | Admit: 2013-03-05 | Discharge: 2013-03-05 | Disposition: A | Discharge status: Home / Self Care | Payer: BC Managed Care – PPO | Source: Ambulatory Visit | Attending: Cardiology | Admitting: Cardiology

## 2013-03-05 DIAGNOSIS — I2510 Atherosclerotic heart disease of native coronary artery without angina pectoris: Secondary | ICD-10-CM | POA: Insufficient documentation

## 2013-03-05 DIAGNOSIS — Z7902 Long term (current) use of antithrombotics/antiplatelets: Secondary | ICD-10-CM | POA: Insufficient documentation

## 2013-03-05 DIAGNOSIS — Z9861 Coronary angioplasty status: Secondary | ICD-10-CM | POA: Insufficient documentation

## 2013-03-08 ENCOUNTER — Other Ambulatory Visit: Payer: BC Managed Care – PPO

## 2013-03-08 ENCOUNTER — Encounter (HOSPITAL_COMMUNITY): Admission: RE | Admit: 2013-03-08 | Payer: BC Managed Care – PPO | Source: Ambulatory Visit

## 2013-03-08 DIAGNOSIS — E785 Hyperlipidemia, unspecified: Secondary | ICD-10-CM

## 2013-03-08 DIAGNOSIS — E119 Type 2 diabetes mellitus without complications: Secondary | ICD-10-CM

## 2013-03-09 LAB — COMPREHENSIVE METABOLIC PANEL
A/G RATIO: 2 (ref 1.1–2.5)
ALK PHOS: 62 IU/L (ref 39–117)
ALT: 22 IU/L (ref 0–44)
AST: 17 IU/L (ref 0–40)
Albumin: 4.4 g/dL (ref 3.6–4.8)
BUN / CREAT RATIO: 13 (ref 10–22)
BUN: 16 mg/dL (ref 8–27)
CALCIUM: 9.2 mg/dL (ref 8.6–10.2)
CO2: 24 mmol/L (ref 18–29)
CREATININE: 1.23 mg/dL (ref 0.76–1.27)
Chloride: 100 mmol/L (ref 97–108)
GFR calc Af Amer: 72 mL/min/{1.73_m2} (ref >59)
GFR calc non Af Amer: 62 mL/min/{1.73_m2} (ref >59)
GLOBULIN, TOTAL: 2.2 g/dL (ref 1.5–4.5)
Glucose: 150 mg/dL — ABNORMAL HIGH (ref 65–99)
POTASSIUM: 4.5 mmol/L (ref 3.5–5.2)
SODIUM: 141 mmol/L (ref 134–144)
Total Bilirubin: 0.7 mg/dL (ref 0.0–1.2)
Total Protein: 6.6 g/dL (ref 6.0–8.5)

## 2013-03-09 LAB — HEMOGLOBIN A1C
Est. average glucose Bld gHb Est-mCnc: 197 mg/dL
Hgb A1c MFr Bld: 8.5 % — ABNORMAL HIGH (ref 4.8–5.6)

## 2013-03-10 ENCOUNTER — Ambulatory Visit (INDEPENDENT_AMBULATORY_CARE_PROVIDER_SITE_OTHER): Payer: BC Managed Care – PPO | Admitting: Internal Medicine

## 2013-03-10 ENCOUNTER — Encounter (HOSPITAL_COMMUNITY)
Admission: RE | Admit: 2013-03-10 | Discharge: 2013-03-10 | Disposition: A | Discharge status: Home / Self Care | Payer: BC Managed Care – PPO | Source: Ambulatory Visit | Attending: Cardiology | Admitting: Cardiology

## 2013-03-10 ENCOUNTER — Encounter: Payer: Self-pay | Admitting: Internal Medicine

## 2013-03-10 VITALS — BP 140/80 | HR 56 | Temp 97.4°F | Resp 18 | Ht 68.0 in | Wt 186.0 lb

## 2013-03-10 DIAGNOSIS — K2991 Gastroduodenitis, unspecified, with bleeding: Secondary | ICD-10-CM

## 2013-03-10 DIAGNOSIS — M542 Cervicalgia: Secondary | ICD-10-CM

## 2013-03-10 DIAGNOSIS — I10 Essential (primary) hypertension: Secondary | ICD-10-CM

## 2013-03-10 DIAGNOSIS — E1159 Type 2 diabetes mellitus with other circulatory complications: Secondary | ICD-10-CM

## 2013-03-10 DIAGNOSIS — E162 Hypoglycemia, unspecified: Secondary | ICD-10-CM

## 2013-03-10 DIAGNOSIS — R1013 Epigastric pain: Secondary | ICD-10-CM

## 2013-03-10 DIAGNOSIS — M25519 Pain in unspecified shoulder: Secondary | ICD-10-CM

## 2013-03-10 DIAGNOSIS — E785 Hyperlipidemia, unspecified: Secondary | ICD-10-CM

## 2013-03-10 DIAGNOSIS — K2971 Gastritis, unspecified, with bleeding: Secondary | ICD-10-CM

## 2013-03-10 NOTE — Patient Instructions (Signed)
See Dr. Thurston HoleWainer about your shoulder and neck pains.

## 2013-03-10 NOTE — Progress Notes (Signed)
Patient ID: [name], male   DOB: 05/27/1950, 63 y.o.   MRN: [id number]    Location:    [facility]  Place of Service:   OFFICE    No Known Allergies  Chief Complaint  Patient presents with  . Follow-up    HPI:  Type II or unspecified type diabetes mellitus with peripheral circulatory disorders, uncontrolled(250.72): Continues to have problems regulating his blood sugar. There have been episodes of hypoglycemia as well as hyperglycemia.  Hypoglycemia, unspecified: Patient reports that leads to symptomatic episodes of blood sugar down in the 40s.  Hypertension: controlled  Cervicalgia: Continues with significant neck discomfort. He is doing exercises daily.  Abdominal pain, epigastric: Continues with epigastric and right upper quadrant intermittent abdominal discomfort. It does not seem to be associated with meals or positioning.  Unspecified gastritis and gastroduodenitis with hemorrhage: Denies heartburn or melena.  Pain in joint, shoulder region, unspecified laterality: Bilateral shoulder discomfort with rotational movements and abduction. Pain is diffuse tenderness not well localized.  Other and unspecified hyperlipidemia - needs followup Lipid panel   Medications: Patient's Medications  New Prescriptions   No medications on file  Previous Medications   ASPIRIN 81 MG EC TABLET    Take 1 tablet (81 mg total) by mouth daily.   ATORVASTATIN (LIPITOR) 20 MG TABLET    Take 20 mg by mouth every morning.    BD PEN NEEDLE NANO U/F 32G X 4 MM MISC    Use one needle daily with Lantus pen   CARVEDILOL (COREG) 6.25 MG TABLET    Take 6.25 mg by mouth 2 (two) times daily with a meal.   NITROGLYCERIN (NITROSTAT) 0.4 MG SL TABLET    Place 0.4 mg under the tongue every 5 (five) minutes as needed for chest pain.   PANTOPRAZOLE (PROTONIX) 40 MG TABLET    TAKE 1 TABLET (40 MG TOTAL) BY MOUTH 2 (TWO) TIMES DAILY.   PRASUGREL (EFFIENT) 10 MG TABS TABLET    Take 1 tablet (10 mg total) by mouth  daily.  Modified Medications   Modified Medication Previous Medication   INSULIN GLARGINE (LANTUS) 100 UNIT/ML SOLOSTAR PEN Insulin Glargine (LANTUS SOLOSTAR) 100 UNIT/ML SOPN      20 units in the morning and 20 units in the evening.    14 units in the morning and 20 units in the evening.   PRAMIPEXOLE (MIRAPEX) 0.75 MG TABLET pramipexole (MIRAPEX) 0.75 MG tablet      TAKE 1/2 TABLET or 1 Tablet BY MOUTH EVERY NIGHT FOR RESTLESS LEGS as needed    TAKE 1/2 TABLET BY MOUTH EVERY NIGHT FOR RESTLESS LEGS  Discontinued Medications   No medications on file     Review of Systems  Constitutional: Positive for fatigue. Negative for fever.  HENT: Positive for hearing loss. Negative for ear pain.   Eyes: Negative.        Wears prescription lenses.  Respiratory: Negative for cough and shortness of breath.   Cardiovascular: Negative for chest pain, palpitations and leg swelling.  Endocrine: Negative for cold intolerance, heat intolerance, polydipsia, polyphagia and polyuria.       Diabetes under poor control.  Genitourinary: Negative.   Musculoskeletal: Positive for arthralgias, back pain, gait problem, joint swelling and myalgias. Negative for neck pain and neck stiffness.  Skin: Negative.   Allergic/Immunologic: Negative.   Neurological: Positive for weakness and headaches. Negative for dizziness, tremors, seizures, syncope, facial asymmetry, speech difficulty, light-headedness and numbness.  Hematological: Negative.   Psychiatric/Behavioral: The patient  is nervous/anxious.     Filed Vitals:   03/10/13 1650  BP: 140/80  Pulse: 56  Temp: 97.4 F (36.3 C)  TempSrc: Oral  Resp: 18  Height: 5' 8"  (1.727 m)  Weight: 186 lb (84.369 kg)  SpO2: 96%   Physical Exam  Constitutional: He is oriented to person, place, and time. He appears well-developed and well-nourished. No distress.  Significantly overweight.  HENT:  Head: Normocephalic and atraumatic.  Right Ear: External ear normal.    Left Ear: External ear normal.  Nose: Nose normal.  Mouth/Throat: Oropharynx is clear and moist.  Eyes: Conjunctivae and EOM are normal. Pupils are equal, round, and reactive to light.  Neck: Normal range of motion. Neck supple. No JVD present. No tracheal deviation present. No thyromegaly present.  Cardiovascular: Normal rate, regular rhythm, normal heart sounds and intact distal pulses.  Exam reveals no gallop and no friction rub.   No murmur heard. Pulmonary/Chest: Effort normal. No respiratory distress. He has no wheezes. He has no rales. He exhibits no tenderness.  Abdominal: Bowel sounds are normal. He exhibits no distension and no mass.  Musculoskeletal: He exhibits no edema and no tenderness.  Bilateral shoulder discomfort with some restriction of movement especially at the right shoulder.  Lymphadenopathy:    He has no cervical adenopathy.  Neurological: He is alert and oriented to person, place, and time. He has normal reflexes. No cranial nerve deficit. Coordination normal.  Skin: Skin is warm and dry. No rash noted. No erythema. No pallor.  Psychiatric: He has a normal mood and affect. His behavior is normal. Judgment and thought content normal.     Labs reviewed: Appointment on 03/08/2013  Component Date Value Range Status  . Glucose 03/08/2013 150* 65 - 99 mg/dL Final  . BUN 03/08/2013 16  8 - 27 mg/dL Final  . Creatinine, Ser 03/08/2013 1.23  0.76 - 1.27 mg/dL Final  . GFR calc non Af Amer 03/08/2013 62  >59 mL/min/1.73 Final  . GFR calc Af Amer 03/08/2013 72  >59 mL/min/1.73 Final  . BUN/Creatinine Ratio 03/08/2013 13  10 - 22 Final  . Sodium 03/08/2013 141  134 - 144 mmol/L Final  . Potassium 03/08/2013 4.5  3.5 - 5.2 mmol/L Final  . Chloride 03/08/2013 100  97 - 108 mmol/L Final  . CO2 03/08/2013 24  18 - 29 mmol/L Final  . Calcium 03/08/2013 9.2  8.6 - 10.2 mg/dL Final   Comment: **Effective March 22, 2013 the reference interval**                             for Calcium, Serum will be changing to:                                       Age                Male          Male                                    0 - 10 days        8.6 - 10.4     8.6 - 10.4  11 days -  1 year        9.2 - 11.0     9.2 - 11.0                                    2 - 11 years       9.1 - 10.5     9.1 - 10.5                                   12 - 17 years       8.9 - 10.4     8.9 - 10.4                                   18 - 59 years       8.7 - 10.2     8.7 - 10.2                                       >59 years       8.6 - 10.2     8.7 - 10.3  . Total Protein 03/08/2013 6.6  6.0 - 8.5 g/dL Final  . Albumin 03/08/2013 4.4  3.6 - 4.8 g/dL Final  . Globulin, Total 03/08/2013 2.2  1.5 - 4.5 g/dL Final  . Albumin/Globulin Ratio 03/08/2013 2.0  1.1 - 2.5 Final  . Total Bilirubin 03/08/2013 0.7  0.0 - 1.2 mg/dL Final  . Alkaline Phosphatase 03/08/2013 62  39 - 117 IU/L Final  . AST 03/08/2013 17  0 - 40 IU/L Final  . ALT 03/08/2013 22  0 - 44 IU/L Final  . Hemoglobin A1C 03/08/2013 8.5* 4.8 - 5.6 % Final   Comment:          Increased risk for diabetes: 5.7 - 6.4                                   Diabetes: >6.4                                   Glycemic control for adults with diabetes: <7.0  . Estimated average glucose 03/08/2013 197   Final  Hospital Outpatient Visit on 02/12/2013  Component Date Value Range Status  . Glucose-Capillary 02/12/2013 151* 70 - 99 mg/dL Final  . Glucose-Capillary 02/12/2013 108* 70 - 99 mg/dL Final  Hospital Outpatient Visit on 02/10/2013  Component Date Value Range Status  . Glucose-Capillary 02/10/2013 292* 70 - 99 mg/dL Final  . Glucose-Capillary 02/10/2013 201* 70 - 99 mg/dL Final  Hospital Outpatient Visit on 02/08/2013  Component Date Value Range Status  . Glucose-Capillary 02/08/2013 236* 70 - 99 mg/dL Final  . Glucose-Capillary 02/08/2013 184* 70 - 99 mg/dL Final  Admission on 12/20/2012, Discharged on  12/21/2012  Component Date Value Range Status  . WBC 12/20/2012 6.3  4.0 - 10.5 K/uL Final  . RBC 12/20/2012 4.73  4.22 - 5.81 MIL/uL Final  . Hemoglobin 12/20/2012 14.7  13.0 - 17.0 g/dL Final  . HCT 12/20/2012 40.4  39.0 - 52.0 % Final  . MCV 12/20/2012 85.4  78.0 - 100.0 fL Final  . MCH 12/20/2012 31.1  26.0 - 34.0 pg Final  . MCHC 12/20/2012 36.4* 30.0 - 36.0 g/dL Final  . RDW 12/20/2012 12.7  11.5 - 15.5 % Final  . Platelets 12/20/2012 124* 150 - 400 K/uL Final  . Sodium 12/20/2012 136  135 - 145 mEq/L Final  . Potassium 12/20/2012 3.8  3.5 - 5.1 mEq/L Final  . Chloride 12/20/2012 98  96 - 112 mEq/L Final  . CO2 12/20/2012 28  19 - 32 mEq/L Final  . Glucose, Bld 12/20/2012 140* 70 - 99 mg/dL Final  . BUN 12/20/2012 18  6 - 23 mg/dL Final  . Creatinine, Ser 12/20/2012 1.04  0.50 - 1.35 mg/dL Final  . Calcium 12/20/2012 9.5  8.4 - 10.5 mg/dL Final  . GFR calc non Af Amer 12/20/2012 74* >90 mL/min Final  . GFR calc Af Amer 12/20/2012 86* >90 mL/min Final   Comment: (NOTE)                          The eGFR has been calculated using the CKD EPI equation.                          This calculation has not been validated in all clinical situations.                          eGFR's persistently <90 mL/min signify possible Chronic Kidney                          Disease.  . Troponin I 12/20/2012 <0.30  <0.30 ng/mL Final   Comment:                                 Due to the release kinetics of cTnI,                          a negative result within the first hours                          of the onset of symptoms does not rule out                          myocardial infarction with certainty.                          If myocardial infarction is still suspected,                          repeat the test at appropriate intervals.  . Heparin Unfractionated 12/21/2012 0.55  0.30 - 0.70 IU/mL Final   Comment:                                 IF HEPARIN RESULTS ARE BELOW                           EXPECTED VALUES, AND PATIENT  DOSAGE HAS BEEN CONFIRMED,                          SUGGEST FOLLOW UP TESTING                          OF ANTITHROMBIN III LEVELS.  . Troponin I 12/20/2012 <0.30  <0.30 ng/mL Final   Comment:                                 Due to the release kinetics of cTnI,                          a negative result within the first hours                          of the onset of symptoms does not rule out                          myocardial infarction with certainty.                          If myocardial infarction is still suspected,                          repeat the test at appropriate intervals.  . Troponin I 12/21/2012 <0.30  <0.30 ng/mL Final   Comment:                                 Due to the release kinetics of cTnI,                          a negative result within the first hours                          of the onset of symptoms does not rule out                          myocardial infarction with certainty.                          If myocardial infarction is still suspected,                          repeat the test at appropriate intervals.  . Sodium 12/21/2012 137  135 - 145 mEq/L Final  . Potassium 12/21/2012 3.6  3.5 - 5.1 mEq/L Final  . Chloride 12/21/2012 102  96 - 112 mEq/L Final  . CO2 12/21/2012 25  19 - 32 mEq/L Final  . Glucose, Bld 12/21/2012 192* 70 - 99 mg/dL Final  . BUN 12/21/2012 18  6 - 23 mg/dL Final  . Creatinine, Ser 12/21/2012 0.97  0.50 - 1.35 mg/dL Final  . Calcium 12/21/2012 8.9  8.4 - 10.5 mg/dL Final  . GFR calc non Af Amer 12/21/2012 86* >90 mL/min Final  . GFR calc Af Amer 12/21/2012 >90  >90 mL/min Final   Comment: (NOTE)  The eGFR has been calculated using the CKD EPI equation.                          This calculation has not been validated in all clinical situations.                          eGFR's persistently <90 mL/min signify possible Chronic Kidney                           Disease.  . Glucose-Capillary 12/20/2012 105* 70 - 99 mg/dL Final  . Sodium 12/21/2012 136  135 - 145 mEq/L Final  . Potassium 12/21/2012 3.3* 3.5 - 5.1 mEq/L Final  . Chloride 12/21/2012 100  96 - 112 mEq/L Final  . CO2 12/21/2012 25  19 - 32 mEq/L Final  . Glucose, Bld 12/21/2012 115* 70 - 99 mg/dL Final  . BUN 12/21/2012 18  6 - 23 mg/dL Final  . Creatinine, Ser 12/21/2012 0.97  0.50 - 1.35 mg/dL Final  . Calcium 12/21/2012 9.4  8.4 - 10.5 mg/dL Final  . Total Protein 12/21/2012 7.2  6.0 - 8.3 g/dL Final  . Albumin 12/21/2012 4.2  3.5 - 5.2 g/dL Final  . AST 12/21/2012 29  0 - 37 U/L Final  . ALT 12/21/2012 28  0 - 53 U/L Final  . Alkaline Phosphatase 12/21/2012 53  39 - 117 U/L Final  . Total Bilirubin 12/21/2012 0.7  0.3 - 1.2 mg/dL Final  . GFR calc non Af Amer 12/21/2012 86* >90 mL/min Final  . GFR calc Af Amer 12/21/2012 >90  >90 mL/min Final   Comment: (NOTE)                          The eGFR has been calculated using the CKD EPI equation.                          This calculation has not been validated in all clinical situations.                          eGFR's persistently <90 mL/min signify possible Chronic Kidney                          Disease.  . Magnesium 12/21/2012 2.0  1.5 - 2.5 mg/dL Final  . WBC 12/21/2012 4.9  4.0 - 10.5 K/uL Final  . RBC 12/21/2012 4.39  4.22 - 5.81 MIL/uL Final  . Hemoglobin 12/21/2012 13.2  13.0 - 17.0 g/dL Final  . HCT 12/21/2012 37.4* 39.0 - 52.0 % Final  . MCV 12/21/2012 85.2  78.0 - 100.0 fL Final  . MCH 12/21/2012 30.1  26.0 - 34.0 pg Final  . MCHC 12/21/2012 35.3  30.0 - 36.0 g/dL Final  . RDW 12/21/2012 12.7  11.5 - 15.5 % Final  . Platelets 12/21/2012 99* 150 - 400 K/uL Final   PLATELET COUNT CONFIRMED BY SMEAR  . Neutrophils Relative % 12/21/2012 62  43 - 77 % Final  . Neutro Abs 12/21/2012 3.0  1.7 - 7.7 K/uL Final  . Lymphocytes Relative 12/21/2012 26  12 - 46 % Final  . Lymphs Abs 12/21/2012 1.3  0.7 - 4.0 K/uL Final  .  Monocytes Relative 12/21/2012 10  3 - 12 % Final  . Monocytes Absolute 12/21/2012 0.5  0.1 - 1.0 K/uL Final  . Eosinophils Relative 12/21/2012 2  0 - 5 % Final  . Eosinophils Absolute 12/21/2012 0.1  0.0 - 0.7 K/uL Final  . Basophils Relative 12/21/2012 0  0 - 1 % Final  . Basophils Absolute 12/21/2012 0.0  0.0 - 0.1 K/uL Final  . Cholesterol 12/21/2012 116  0 - 200 mg/dL Final  . Triglycerides 12/21/2012 160* <150 mg/dL Final  . HDL 12/21/2012 33* >39 mg/dL Final  . Total CHOL/HDL Ratio 12/21/2012 3.5   Final  . VLDL 12/21/2012 32  0 - 40 mg/dL Final  . LDL Cholesterol 12/21/2012 51  0 - 99 mg/dL Final   Comment:                                 Total Cholesterol/HDL:CHD Risk                          Coronary Heart Disease Risk Table                                              Men   Women                           1/2 Average Risk   3.4   3.3                           Average Risk       5.0   4.4                           2 X Average Risk   9.6   7.1                           3 X Average Risk  23.4   11.0                                                          Use the calculated Patient Ratio                          above and the CHD Risk Table                          to determine the patient's CHD Risk.                                                          ATP III CLASSIFICATION (LDL):                           <100  mg/dL   Optimal                           100-129  mg/dL   Near or Above                                             Optimal                           130-159  mg/dL   Borderline                           160-189  mg/dL   High                           >190     mg/dL   Very High  . Glucose-Capillary 12/21/2012 99  70 - 99 mg/dL Final  . Comment 1 12/21/2012 Documented in Chart   Final  . Comment 2 12/21/2012 Notify RN   Final  . MRSA by PCR 12/21/2012 NEGATIVE  NEGATIVE Final   Comment:                                 The GeneXpert MRSA Assay (FDA                           approved for NASAL specimens                          only), is one component of a                          comprehensive MRSA colonization                          surveillance program. It is not                          intended to diagnose MRSA                          infection nor to guide or                          monitor treatment for                          MRSA infections.  Admission on 12/15/2012, Discharged on 12/16/2012  Component Date Value Range Status  . Glucose-Capillary 12/15/2012 170* 70 - 99 mg/dL Final  . Glucose-Capillary 12/15/2012 118* 70 - 99 mg/dL Final  . WBC 12/16/2012 7.0  4.0 - 10.5 K/uL Final  . RBC 12/16/2012 4.52  4.22 - 5.81 MIL/uL Final  . Hemoglobin 12/16/2012 13.9  13.0 - 17.0 g/dL Final  . HCT 12/16/2012 38.4* 39.0 - 52.0 % Final  . MCV 12/16/2012 85.0  78.0 - 100.0 fL Final  . MCH 12/16/2012 30.8  26.0 - 34.0 pg Final  . MCHC 12/16/2012  36.2* 30.0 - 36.0 g/dL Final  . RDW 12/16/2012 12.8  11.5 - 15.5 % Final  . Platelets 12/16/2012 106* 150 - 400 K/uL Final   PLATELET COUNT CONFIRMED BY SMEAR  . Sodium 12/16/2012 140  135 - 145 mEq/L Final  . Potassium 12/16/2012 3.7  3.5 - 5.1 mEq/L Final  . Chloride 12/16/2012 105  96 - 112 mEq/L Final  . CO2 12/16/2012 25  19 - 32 mEq/L Final  . Glucose, Bld 12/16/2012 141* 70 - 99 mg/dL Final  . BUN 12/16/2012 14  6 - 23 mg/dL Final  . Creatinine, Ser 12/16/2012 0.92  0.50 - 1.35 mg/dL Final  . Calcium 12/16/2012 8.8  8.4 - 10.5 mg/dL Final  . GFR calc non Af Amer 12/16/2012 88* >90 mL/min Final  . GFR calc Af Amer 12/16/2012 >90  >90 mL/min Final   Comment: (NOTE)                          The eGFR has been calculated using the CKD EPI equation.                          This calculation has not been validated in all clinical situations.                          eGFR's persistently <90 mL/min signify possible Chronic Kidney                          Disease.  Marland Kitchen Activated Clotting Time  12/15/2012 575   Final  . Glucose-Capillary 12/15/2012 135* 70 - 99 mg/dL Final  . Glucose-Capillary 12/15/2012 174* 70 - 99 mg/dL Final  . Comment 1 12/15/2012 Notify RN   Final  . Comment 2 12/15/2012 Documented in Chart   Final  . Glucose-Capillary 12/16/2012 175* 70 - 99 mg/dL Final      Assessment/Plan  1. Type II or unspecified type diabetes mellitus with peripheral circulatory disorders, uncontrolled(250.72) No change in medications - Hemoglobin A1c; Future - CMP - CMP; Future  2. Hypoglycemia, unspecified May need to "fill in " with between meal snacks  3. Hypertension Controlled - CMP  4. Cervicalgia Neck exercises twice daily  5. Abdominal pain, epigastric observe  6. Unspecified gastritis and gastroduodenitis with hemorrhage controlled  7. Pain in joint, shoulder region, unspecified laterality May need to see orthopedist  8. Other and unspecified hyperlipidemia - Lipid panel; Future

## 2013-03-12 ENCOUNTER — Encounter (HOSPITAL_COMMUNITY)
Admission: RE | Admit: 2013-03-12 | Discharge: 2013-03-12 | Disposition: A | Discharge status: Home / Self Care | Payer: BC Managed Care – PPO | Source: Ambulatory Visit | Attending: Cardiology | Admitting: Cardiology

## 2013-03-12 NOTE — Addendum Note (Signed)
Encounter addended by: Chelsea Ausarlette B Shareece Bultman, RN on: 03/12/2013 10:08 AM<BR>     Documentation filed: Charges VN, SmartForms

## 2013-03-12 NOTE — Addendum Note (Signed)
Encounter addended by: Chelsea Ausarlette B Airon Sahni, RN on: 03/12/2013 10:09 AM<BR>     Documentation filed: Charges VN, SmartForms

## 2013-03-12 NOTE — Addendum Note (Signed)
Encounter addended by: Chelsea Ausarlette B Ocie Tino, RN on: 03/12/2013 10:09 AM<BR>     Documentation filed: SmartForms, Charges VN, Notes Section

## 2013-03-12 NOTE — Addendum Note (Signed)
Encounter addended by: Chelsea Ausarlette B Carlton, RN on: 03/12/2013 10:00 AM<BR>     Documentation filed: Charges VN, SmartForms

## 2013-03-12 NOTE — Progress Notes (Addendum)
Reviewed QOL survey results with pt.  Pt with low scores noted in health and function.  Pt relates this is from overall decline in health particularly with DM management.  Pt's most recent ALC showed improvement but is not at goal or where pt would like that number to be.  Pt attributes this to diet and admits he has made changes and is hopeful the next A1c would be reflected of this. Pt's highest A1c was 15.  Pt exercises on the days he is not here and has attended several education and diet classes.  Pt demonstrates appropriate coping skills.

## 2013-03-15 ENCOUNTER — Encounter (HOSPITAL_COMMUNITY)
Admission: RE | Admit: 2013-03-15 | Discharge: 2013-03-15 | Disposition: A | Discharge status: Home / Self Care | Payer: BC Managed Care – PPO | Source: Ambulatory Visit | Attending: Cardiology | Admitting: Cardiology

## 2013-03-17 ENCOUNTER — Encounter (HOSPITAL_COMMUNITY): Payer: BC Managed Care – PPO

## 2013-03-19 ENCOUNTER — Encounter (HOSPITAL_COMMUNITY)
Admission: RE | Admit: 2013-03-19 | Discharge: 2013-03-19 | Disposition: A | Discharge status: Home / Self Care | Payer: BC Managed Care – PPO | Source: Ambulatory Visit | Attending: Cardiology | Admitting: Cardiology

## 2013-03-22 ENCOUNTER — Encounter (HOSPITAL_COMMUNITY)
Admission: RE | Admit: 2013-03-22 | Discharge: 2013-03-22 | Disposition: A | Discharge status: Home / Self Care | Payer: BC Managed Care – PPO | Source: Ambulatory Visit | Attending: Cardiology | Admitting: Cardiology

## 2013-03-24 ENCOUNTER — Encounter (HOSPITAL_COMMUNITY)
Admission: RE | Admit: 2013-03-24 | Discharge: 2013-03-24 | Disposition: A | Discharge status: Home / Self Care | Payer: BC Managed Care – PPO | Source: Ambulatory Visit | Attending: Cardiology | Admitting: Cardiology

## 2013-03-26 ENCOUNTER — Other Ambulatory Visit: Payer: Self-pay

## 2013-03-26 ENCOUNTER — Emergency Department (HOSPITAL_COMMUNITY): Payer: BC Managed Care – PPO

## 2013-03-26 ENCOUNTER — Encounter (HOSPITAL_COMMUNITY)
Admission: EM | Disposition: A | Discharge status: Home / Self Care | Payer: Self-pay | Source: Home / Self Care | Attending: Internal Medicine

## 2013-03-26 ENCOUNTER — Encounter (HOSPITAL_COMMUNITY): Payer: Self-pay | Admitting: Emergency Medicine

## 2013-03-26 ENCOUNTER — Inpatient Hospital Stay (HOSPITAL_COMMUNITY)
Admission: EM | Admit: 2013-03-26 | Discharge: 2013-03-27 | DRG: 552 | Disposition: A | Discharge status: Home / Self Care | Payer: BC Managed Care – PPO | Attending: Internal Medicine | Admitting: Internal Medicine

## 2013-03-26 ENCOUNTER — Encounter (HOSPITAL_COMMUNITY)
Admission: RE | Admit: 2013-03-26 | Discharge: 2013-03-26 | Disposition: A | Discharge status: Home / Self Care | Payer: BC Managed Care – PPO | Source: Ambulatory Visit | Attending: Cardiology | Admitting: Cardiology

## 2013-03-26 DIAGNOSIS — D696 Thrombocytopenia, unspecified: Secondary | ICD-10-CM | POA: Diagnosis present

## 2013-03-26 DIAGNOSIS — Z7982 Long term (current) use of aspirin: Secondary | ICD-10-CM

## 2013-03-26 DIAGNOSIS — K219 Gastro-esophageal reflux disease without esophagitis: Secondary | ICD-10-CM | POA: Diagnosis present

## 2013-03-26 DIAGNOSIS — Z9861 Coronary angioplasty status: Secondary | ICD-10-CM

## 2013-03-26 DIAGNOSIS — I200 Unstable angina: Secondary | ICD-10-CM | POA: Diagnosis present

## 2013-03-26 DIAGNOSIS — E119 Type 2 diabetes mellitus without complications: Secondary | ICD-10-CM | POA: Diagnosis present

## 2013-03-26 DIAGNOSIS — R079 Chest pain, unspecified: Secondary | ICD-10-CM

## 2013-03-26 DIAGNOSIS — M542 Cervicalgia: Principal | ICD-10-CM | POA: Diagnosis present

## 2013-03-26 DIAGNOSIS — I2510 Atherosclerotic heart disease of native coronary artery without angina pectoris: Secondary | ICD-10-CM | POA: Diagnosis present

## 2013-03-26 DIAGNOSIS — R0789 Other chest pain: Secondary | ICD-10-CM | POA: Diagnosis present

## 2013-03-26 DIAGNOSIS — G2581 Restless legs syndrome: Secondary | ICD-10-CM | POA: Diagnosis present

## 2013-03-26 DIAGNOSIS — I10 Essential (primary) hypertension: Secondary | ICD-10-CM | POA: Diagnosis present

## 2013-03-26 DIAGNOSIS — E785 Hyperlipidemia, unspecified: Secondary | ICD-10-CM | POA: Diagnosis present

## 2013-03-26 DIAGNOSIS — Z794 Long term (current) use of insulin: Secondary | ICD-10-CM

## 2013-03-26 HISTORY — PX: LEFT HEART CATHETERIZATION WITH CORONARY ANGIOGRAM: SHX5451

## 2013-03-26 HISTORY — PX: FRACTIONAL FLOW RESERVE WIRE: SHX5839

## 2013-03-26 LAB — POCT ACTIVATED CLOTTING TIME: Activated Clotting Time: 271 seconds

## 2013-03-26 LAB — CBC
HCT: 39.5 % (ref 39.0–52.0)
Hemoglobin: 14.3 g/dL (ref 13.0–17.0)
MCH: 31 pg (ref 26.0–34.0)
MCHC: 36.2 g/dL — AB (ref 30.0–36.0)
MCV: 85.7 fL (ref 78.0–100.0)
Platelets: 118 10*3/uL — ABNORMAL LOW (ref 150–400)
RBC: 4.61 MIL/uL (ref 4.22–5.81)
RDW: 12.3 % (ref 11.5–15.5)
WBC: 5.4 10*3/uL (ref 4.0–10.5)

## 2013-03-26 LAB — BASIC METABOLIC PANEL
BUN: 19 mg/dL (ref 6–23)
CALCIUM: 9 mg/dL (ref 8.4–10.5)
CO2: 25 mEq/L (ref 19–32)
CREATININE: 1.06 mg/dL (ref 0.50–1.35)
Chloride: 105 mEq/L (ref 96–112)
GFR, EST AFRICAN AMERICAN: 84 mL/min — AB (ref >90)
GFR, EST NON AFRICAN AMERICAN: 73 mL/min — AB (ref >90)
Glucose, Bld: 120 mg/dL — ABNORMAL HIGH (ref 70–99)
Potassium: 4.1 mEq/L (ref 3.7–5.3)
SODIUM: 141 meq/L (ref 137–147)

## 2013-03-26 LAB — GLUCOSE, CAPILLARY
Glucose-Capillary: 69 mg/dL — ABNORMAL LOW (ref 70–99)
Glucose-Capillary: 137 mg/dL — ABNORMAL HIGH (ref 70–99)

## 2013-03-26 LAB — POCT I-STAT TROPONIN I: TROPONIN I, POC: 0.01 ng/mL (ref 0.00–0.08)

## 2013-03-26 LAB — MRSA PCR SCREENING: MRSA by PCR: NEGATIVE

## 2013-03-26 SURGERY — LEFT HEART CATHETERIZATION WITH CORONARY ANGIOGRAM
Anesthesia: LOCAL | Laterality: Right

## 2013-03-26 MED ORDER — NITROGLYCERIN 0.2 MG/ML ON CALL CATH LAB
INTRAVENOUS | Status: AC
  Filled 2013-03-26: qty 1

## 2013-03-26 MED ORDER — MIDAZOLAM HCL 2 MG/2ML IJ SOLN
INTRAMUSCULAR | Status: AC
  Filled 2013-03-26: qty 2

## 2013-03-26 MED ORDER — LIDOCAINE HCL (PF) 1 % IJ SOLN
INTRAMUSCULAR | Status: AC
  Filled 2013-03-26: qty 30

## 2013-03-26 MED ORDER — NITROGLYCERIN 0.4 MG SL SUBL: 0.4000 mg | SUBLINGUAL_TABLET | SUBLINGUAL | Status: DC | PRN

## 2013-03-26 MED ORDER — DIAZEPAM 5 MG PO TABS: 5.0000 mg | ORAL_TABLET | ORAL | Status: DC | PRN

## 2013-03-26 MED ORDER — ACETAMINOPHEN 500 MG PO TABS: 1000.0000 mg | ORAL_TABLET | Freq: Four times a day (QID) | ORAL | Status: DC | PRN

## 2013-03-26 MED ORDER — ACETAMINOPHEN 325 MG PO TABS: 650.0000 mg | ORAL_TABLET | ORAL | Status: DC | PRN

## 2013-03-26 MED ORDER — ASPIRIN EC 81 MG PO TBEC
81.0000 mg | DELAYED_RELEASE_TABLET | Freq: Every day | ORAL | Status: DC
  Administered 2013-03-27: 10:00:00 81 mg via ORAL
  Filled 2013-03-26: qty 1

## 2013-03-26 MED ORDER — PRAMIPEXOLE DIHYDROCHLORIDE 0.75 MG PO TABS
0.3750 mg | ORAL_TABLET | Freq: Every evening | ORAL | Status: DC | PRN
  Administered 2013-03-26: 23:00:00 0.375 mg via ORAL
  Filled 2013-03-26 (×4): qty 1

## 2013-03-26 MED ORDER — SODIUM CHLORIDE 0.9 % IV SOLN: 1.0000 mL/kg/h | INTRAVENOUS | Status: AC

## 2013-03-26 MED ORDER — HEPARIN (PORCINE) IN NACL 2-0.9 UNIT/ML-% IJ SOLN
INTRAMUSCULAR | Status: AC
  Filled 2013-03-26: qty 1500

## 2013-03-26 MED ORDER — SODIUM CHLORIDE 0.9 % IV SOLN: INTRAVENOUS | Status: DC

## 2013-03-26 MED ORDER — HEPARIN SODIUM (PORCINE) 1000 UNIT/ML IJ SOLN
INTRAMUSCULAR | Status: AC
  Filled 2013-03-26: qty 1

## 2013-03-26 MED ORDER — SODIUM CHLORIDE 0.9 % IJ SOLN
3.0000 mL | INTRAMUSCULAR | Status: DC | PRN
Start: 2013-03-26 — End: 2013-03-27

## 2013-03-26 MED ORDER — CARVEDILOL 6.25 MG PO TABS
6.2500 mg | ORAL_TABLET | Freq: Two times a day (BID) | ORAL | Status: DC
  Administered 2013-03-27: 6.25 mg via ORAL
  Filled 2013-03-26 (×3): qty 1

## 2013-03-26 MED ORDER — ONDANSETRON HCL 4 MG/2ML IJ SOLN
4.0000 mg | Freq: Once | INTRAMUSCULAR | Status: AC
  Administered 2013-03-26: 4 mg via INTRAVENOUS
  Filled 2013-03-26: qty 2

## 2013-03-26 MED ORDER — NITROGLYCERIN IN D5W 200-5 MCG/ML-% IV SOLN
5.0000 ug/min | Freq: Once | INTRAVENOUS | Status: AC
  Administered 2013-03-26: 5 ug/min via INTRAVENOUS
  Filled 2013-03-26: qty 250

## 2013-03-26 MED ORDER — MORPHINE SULFATE 4 MG/ML IJ SOLN
4.0000 mg | Freq: Once | INTRAMUSCULAR | Status: AC
  Administered 2013-03-26: 4 mg via INTRAVENOUS
  Filled 2013-03-26: qty 1

## 2013-03-26 MED ORDER — SODIUM CHLORIDE 0.9 % IV SOLN: 250.0000 mL | INTRAVENOUS | Status: DC | PRN

## 2013-03-26 MED ORDER — ALUM & MAG HYDROXIDE-SIMETH 200-200-20 MG/5ML PO SUSP: 30.0000 mL | ORAL | Status: DC | PRN

## 2013-03-26 MED ORDER — ASPIRIN 300 MG RE SUPP
300.0000 mg | RECTAL | Status: DC
  Filled 2013-03-26: qty 1

## 2013-03-26 MED ORDER — ONDANSETRON HCL 4 MG/2ML IJ SOLN: 4.0000 mg | Freq: Four times a day (QID) | INTRAMUSCULAR | Status: DC | PRN

## 2013-03-26 MED ORDER — HYDROMORPHONE HCL PF 1 MG/ML IJ SOLN
INTRAMUSCULAR | Status: AC
Start: 2013-03-26 — End: 2013-03-26
  Filled 2013-03-26: qty 1

## 2013-03-26 MED ORDER — PANTOPRAZOLE SODIUM 40 MG PO TBEC
40.0000 mg | DELAYED_RELEASE_TABLET | Freq: Two times a day (BID) | ORAL | Status: DC
  Administered 2013-03-26 – 2013-03-27 (×2): 40 mg via ORAL
  Filled 2013-03-26 (×2): qty 1

## 2013-03-26 MED ORDER — ASPIRIN EC 81 MG PO TBEC: 81.0000 mg | DELAYED_RELEASE_TABLET | Freq: Every day | ORAL | Status: DC

## 2013-03-26 MED ORDER — SODIUM CHLORIDE 0.9 % IJ SOLN: 3.0000 mL | INTRAMUSCULAR | Status: DC | PRN

## 2013-03-26 MED ORDER — ASPIRIN 325 MG PO TABS
325.0000 mg | ORAL_TABLET | Freq: Once | ORAL | Status: AC
  Administered 2013-03-26: 325 mg via ORAL
  Filled 2013-03-26: qty 1

## 2013-03-26 MED ORDER — SODIUM CHLORIDE 0.9 % IJ SOLN: 3.0000 mL | Freq: Two times a day (BID) | INTRAMUSCULAR | Status: DC

## 2013-03-26 MED ORDER — VERAPAMIL HCL 2.5 MG/ML IV SOLN
INTRAVENOUS | Status: AC
  Filled 2013-03-26: qty 2

## 2013-03-26 MED ORDER — ATORVASTATIN CALCIUM 20 MG PO TABS
20.0000 mg | ORAL_TABLET | Freq: Every morning | ORAL | Status: DC
  Administered 2013-03-27: 10:00:00 20 mg via ORAL
  Filled 2013-03-26: qty 1

## 2013-03-26 MED ORDER — HEPARIN (PORCINE) IN NACL 100-0.45 UNIT/ML-% IJ SOLN
1100.0000 [IU]/h | INTRAMUSCULAR | Status: DC
  Administered 2013-03-26: 1100 [IU]/h via INTRAVENOUS
  Filled 2013-03-26: qty 250

## 2013-03-26 MED ORDER — ADENOSINE 12 MG/4ML IV SOLN
16.0000 mL | Freq: Once | INTRAVENOUS | Status: DC
  Filled 2013-03-26: qty 16

## 2013-03-26 MED ORDER — ONDANSETRON HCL 4 MG/2ML IJ SOLN
4.0000 mg | Freq: Four times a day (QID) | INTRAMUSCULAR | Status: DC | PRN
Start: 2013-03-26 — End: 2013-03-26

## 2013-03-26 MED ORDER — ALPRAZOLAM 0.25 MG PO TABS: 0.2500 mg | ORAL_TABLET | Freq: Two times a day (BID) | ORAL | Status: DC | PRN

## 2013-03-26 MED ORDER — HEPARIN BOLUS VIA INFUSION
4000.0000 [IU] | Freq: Once | INTRAVENOUS | Status: AC
  Administered 2013-03-26: 4000 [IU] via INTRAVENOUS
  Filled 2013-03-26: qty 4000

## 2013-03-26 MED ORDER — PRASUGREL HCL 10 MG PO TABS
10.0000 mg | ORAL_TABLET | Freq: Every day | ORAL | Status: DC
  Administered 2013-03-27: 10:00:00 10 mg via ORAL
  Filled 2013-03-26: qty 1

## 2013-03-26 MED ORDER — ASPIRIN 81 MG PO CHEW: 324.0000 mg | CHEWABLE_TABLET | ORAL | Status: DC

## 2013-03-26 MED ORDER — INSULIN ASPART 100 UNIT/ML ~~LOC~~ SOLN: 0.0000 [IU] | Freq: Three times a day (TID) | SUBCUTANEOUS | Status: DC

## 2013-03-26 NOTE — Progress Notes (Signed)
Patient admitted to [facility] at 1515.  A full assessment was performed on the patient at that time and orders were confirmed.  At 1551 Dr Jacinto HalimGanji was paged and notified that his patient was on the unit.  He gave verbal order to administer IV fluids at 150 ml/hour and give a 500 ml bolus of fluid in preparation to go to the cath lab at 1630.  At 1556, Cath lab staff called to make sure consent would be obtained and informed that transport would pick the patient up in about 10 minutes.  Surgical consent was obtained from the patient at 1605.  Cath lab staff picked up the patient at 1608 and transported to the cath lab.  Patient's personal belongings were placed in patient belonging bags and given to his wife.  More valuable items were given to the wife and she placed them in her purse.  The patient's wife accompanied staff and patient to the cath lab.  She was given instructions to sit in the waiting area at short stay and the doctor would come speak to her when the procedure is finished.  [facility]  At that time, all leads and monitors from the unit were removed.  Central telemetry was called and notified of the transfer.

## 2013-03-26 NOTE — Progress Notes (Signed)
ANTICOAGULATION CONSULT NOTE - Initial Consult  Pharmacy Consult for heparin Indication: chest pain/ACS  No Known Allergies  Patient Measurements: Height: 5\' 8"  (172.7 cm) Weight: 184 lb (83.462 kg) IBW/kg (Calculated) : 68.4 Heparin Dosing Weight: 83.5kg  Vital Signs: Temp: 97.9 F (36.6 C) (01/23 1228) Temp src: Oral (01/23 1228) BP: 125/81 mmHg (01/23 1228) Pulse Rate: 57 (01/23 1228)  Labs:  Recent Labs  03/26/13 1148  HGB 14.3  HCT 39.5  PLT 118*  CREATININE 1.06    Estimated Creatinine Clearance: 75.1 ml/min (by C-G formula based on Cr of 1.06).   Medical History: Past Medical History  Diagnosis Date  . Restless leg   . Cervicalgia   . Loss of weight   . Impotence of organic origin   . Umbilical hernia without mention of obstruction or gangrene   . Barrett's esophagus   . Unspecified gastritis and gastroduodenitis with hemorrhage   . Hyperlipidemia   . Other malaise and fatigue   . Hypoglycemia, unspecified   . GERD (gastroesophageal reflux disease)   . Complication of anesthesia     " i DO NOT WAKE UP VERY WELL "  . PONV (postoperative nausea and vomiting)   . Hypertension   . Heart murmur     "grew out of it" (12/15/2012)  . Chronic bronchitis     "not bad lately; used to get it 3-4 times/yr" (12/15/2012)  . IDDM (insulin dependent diabetes mellitus)   . H/O hiatal hernia   . Tension headache   . Headache     "weekly" (12/15/2012)  . COPD (chronic obstructive pulmonary disease)     Medications:  Infusions:  . heparin    . heparin      Assessment: [age] yom presented to the ED with CP. To start IV heparin for anticoagulation. Baseline H/H is WNL but plts are slightly low. He is not on any anticoagulation PTA.   Goal of Therapy:  Heparin level 0.3-0.7 units/ml Monitor platelets by anticoagulation protocol: Yes   Plan:  1. Heparin bolus 4000 units IV x 1 2. Heparin gtt 1100 units/hr 3. Check a 6 hour heparin level 4. Daily heparin  level and CBC  Jeffie Spivack, Drake Leachachel Lynn 03/26/2013,1:53 PM

## 2013-03-26 NOTE — H&P (Signed)
[name] is an 63 y.o. male.   Chief Complaint: Chest pain HPI: The patient is a 63 year old male who presents to the ED with Chest pain  He underwent coronary angiography on 12/15/2012 and was found to have complex coronary artery disease for which he underwent excellent angioplasty results without any significant residual disease. However he presented back to the emergency room and was admitted for overnight observation without any EKG changes of ischemia, ruled out for myocardial infarction and discharged home about 3 months ago.  He called our office earlier this morning and stated that while he was doing cardiac rehabilitation he had chest pain which he described as sharp.  He went home and took 2 sublingual nitroglycerin with relief of chest discomfort without any recurrence.  He stated was that he felt well.  He then presented to the emergency room and complains of chest pain just again sharp in quality but also has dull aching quality.  It got partially relieved with morphine sulfate, however due to persistent of chest discomfort patient is being admitted for unstable angina pectoris  He denies any shortness breath, palpitations, dizziness or syncope.  No leg edema painful swelling of the lower extremities.  No hemoptysis.   Past Medical History  Diagnosis Date  . Restless leg   . Cervicalgia   . Loss of weight   . Impotence of organic origin   . Umbilical hernia without mention of obstruction or gangrene   . Barrett's esophagus   . Unspecified gastritis and gastroduodenitis with hemorrhage   . Hyperlipidemia   . Other malaise and fatigue   . Hypoglycemia, unspecified   . GERD (gastroesophageal reflux disease)   . Complication of anesthesia     " i DO NOT WAKE UP VERY WELL "  . PONV (postoperative nausea and vomiting)   . Hypertension   . Heart murmur     "grew out of it" (12/15/2012)  . Chronic bronchitis     "not bad lately; used to get it 3-4 times/yr" (12/15/2012)  .  IDDM (insulin dependent diabetes mellitus)   . H/O hiatal hernia   . Tension headache   . Headache     "weekly" (12/15/2012)  . COPD (chronic obstructive pulmonary disease)     Past Surgical History  Procedure Laterality Date  . Knee surgery Right 1992 and 2000    "calcium deposits removed" (12/15/2012)  . Coronary angioplasty with stent placement  12/15/2012    "2 stents" (12/15/2012)  . Hernia repair  0762    "umbilical" (26/33/3545)  . Incision and drainage abscess Left 05/2007    "groin" (12/15/2012)    History reviewed. No pertinent family history. Social History:  reports that he has never smoked. He has never used smokeless tobacco. He reports that he does not drink alcohol or use illicit drugs.  Allergies: No Known Allergies  Review of Systems - General:Not Present- Fever and Night Sweats. Skin:Not Present- Itching and Rash. HEENT:Not Present- Headache. Respiratory:Not Present- Difficulty Breathing. Cardiovascular:Not Present- Claudications, Fainting, Orthopnea and Swelling of Extremities. Gastrointestinal:Not Present- Abdominal Pain, Constipation, Diarrhea, Nausea and Vomiting. Musculoskeletal:Not Present- Joint Swelling. Neurological:Not Present- Headaches. Hematology:Not Present- Blood Clots, Easy Bruising and Nose Bleed.  Blood pressure 109/89, pulse 64, temperature 97.9 F (36.6 C), temperature source Oral, resp. rate 13, height 5' 8"  (1.727 m), weight 83.462 kg (184 lb), SpO2 98.00%.   General: Well built and normal body habitus who is in no acute distress. Appears stated age. Alert Ox3.  There is no cyanosis. HEENT: normal limits. PERRLA, No JVD.   CARDIAC EXAM: S1, S2 normal, no gallop present. No murmur.   CHEST EXAM: No tenderness of chest wall. LUNGS: Clear to percuss and auscultate.  ABDOMEN: No hepatosplenomegaly. BS normal in all 4 quadrants. Abdomen is non-tender.   EXTREMITY: Full range of movementes, No edema. MUSCULOSKELETAL  EXAM: Intact with full range of motion in all 4 extremities.   NEUROLOGIC EXAM: Grossly intact without any focal deficits. Alert O x 3.   VASCULAR EXAM: No skin breakdown. Carotids normal. Extremities: Femoral pulse normal. Popliteal pulse normal ; Pedal pulse normal. Otherwise No prominent pulse felt in the abdomen. No varicose veins.  Results for orders placed during the hospital encounter of 03/26/13 (from the past 48 hour(s))  CBC     Status: Abnormal   Collection Time    03/26/13 11:48 AM      Result Value Range   WBC 5.4  4.0 - 10.5 K/uL   RBC 4.61  4.22 - 5.81 MIL/uL   Hemoglobin 14.3  13.0 - 17.0 g/dL   HCT 39.5  39.0 - 52.0 %   MCV 85.7  78.0 - 100.0 fL   MCH 31.0  26.0 - 34.0 pg   MCHC 36.2 (*) 30.0 - 36.0 g/dL   RDW 12.3  11.5 - 15.5 %   Platelets 118 (*) 150 - 400 K/uL   Comment: PLATELET COUNT CONFIRMED BY SMEAR  BASIC METABOLIC PANEL     Status: Abnormal   Collection Time    03/26/13 11:48 AM      Result Value Range   Sodium 141  137 - 147 mEq/L   Potassium 4.1  3.7 - 5.3 mEq/L   Chloride 105  96 - 112 mEq/L   CO2 25  19 - 32 mEq/L   Glucose, Bld 120 (*) 70 - 99 mg/dL   BUN 19  6 - 23 mg/dL   Creatinine, Ser 1.06  0.50 - 1.35 mg/dL   Calcium 9.0  8.4 - 10.5 mg/dL   GFR calc non Af Amer 73 (*) >90 mL/min   GFR calc Af Amer 84 (*) >90 mL/min   Comment: (NOTE)     The eGFR has been calculated using the CKD EPI equation.     This calculation has not been validated in all clinical situations.     eGFR's persistently <90 mL/min signify possible Chronic Kidney     Disease.  POCT I-STAT TROPONIN I     Status: None   Collection Time    03/26/13 11:53 AM      Result Value Range   Troponin i, poc 0.01  0.00 - 0.08 ng/mL   Comment 3            Comment: Due to the release kinetics of cTnI,     a negative result within the first hours     of the onset of symptoms does not rule out     myocardial infarction with certainty.     If myocardial infarction is still  suspected,     repeat the test at appropriate intervals.   Dg Chest 2 View  03/26/2013   CLINICAL DATA:  Chest pain.  EXAM: CHEST  2 VIEW  COMPARISON:  Chest x-ray 12/20/2012.  FINDINGS: Mediastinum and hilar structures are normal. Lungs are clear. Heart size normal. Degenerative changes thoracic spine. Degenerative changes both shoulders.  IMPRESSION: No active cardiopulmonary disease.   Electronically Signed   By: Marcello Moores  Register   On: 03/26/2013 11:30    Labs:   Lab Results  Component Value Date   WBC 5.4 03/26/2013   HGB 14.3 03/26/2013   HCT 39.5 03/26/2013   MCV 85.7 03/26/2013   PLT 118* 03/26/2013    Recent Labs Lab 03/26/13 1148  NA 141  K 4.1  CL 105  CO2 25  BUN 19  CREATININE 1.06  CALCIUM 9.0  GLUCOSE 120*   Lab Results  Component Value Date   TROPONINI <0.30 12/21/2012    Lipid Panel     Component Value Date/Time   CHOL 116 12/21/2012 0240   TRIG 160* 12/21/2012 0240   HDL 33* 12/21/2012 0240   CHOLHDL 3.5 12/21/2012 0240   VLDL 32 12/21/2012 0240   LDLCALC 51 12/21/2012 0240   EKG  Normal sinus rhythm at rate of 70 bpm,  poor R-wave progression, no evidence of ischemia. No change from 11/19/2012.  Assessment/Plan 1. Chest pain with ongoing discomfort in spite of being on IV heparin and IV nitroglycerin. Patient has atypical qualities, however it is the second admission to the hospital with recurrent chest pain. 2. CAD S/P percutaneous coronary angioplasty   Coronary angiogram 12/15/2012: Stenting proximal and midsegment of the LAD with implantation of a 2.75 x 38 mm promos Premier DES. Balloon angioplasty D1 and distal LAD high-grade 90% stenosis. Stenting distal RCA 2.75 x 24 mm promos Premier DES.  Hyperlipidemia  Story: Labs 11/20/2012: Total cholesterol 153, triglycerides 116, HDL 36, LDL 94. LDL particle number severely elevated at 1640. LP-IR score is markedly elevated at 80 suggestive insulin resistance. TSH was within normal limits.    Recommendation: I discussed the findings of the EKG and labs with the patient. Ongoing chest discomfort we'll proceed with coronary angiography. Patient is aware of the risks, benefits and alternate is to coronary angiography including but not limited to less than 1% risk of that, stroke, MI, need for urgent CABG, bleeding, renal failure but not limited to these. Further conditions falling coronary angiography.  Laverda Page, MD 03/26/2013, 2:12 PM [facility]. [place] Pager: [phone] Office: [phone] If no answer: Cell:  [phone]

## 2013-03-26 NOTE — Interval H&P Note (Signed)
History and Physical Interval Note:  03/26/2013 4:35 PM  Laretta Bolsteralph K Dibiasio  has presented today for surgery, with the diagnosis of Chest pain  The various methods of treatment have been discussed with the patient and family. After consideration of risks, benefits and other options for treatment, the patient has consented to  Procedure(s): LEFT HEART CATHETERIZATION WITH CORONARY ANGIOGRAM (N/A) and possible PCI as a surgical intervention .  The patient's history has been reviewed, patient examined, no change in status, stable for surgery.  I have reviewed the patient's chart and labs.  Questions were answered to the patient's satisfaction.   Cath Lab Visit (complete for each Cath Lab visit)  Clinical Evaluation Leading to the Procedure:   ACS: yes  Non-ACS:    Anginal Classification: CCS IV  Anti-ischemic medical therapy: Maximal Therapy (2 or more classes of medications)  Non-Invasive Test Results: No non-invasive testing performed  Prior CABG: No previous CABG        [facility],JAGADEESH R

## 2013-03-26 NOTE — ED Provider Notes (Signed)
CSN: [id number]     Arrival date & time 03/26/13  1032 History   First MD Initiated Contact with Patient 03/26/13 1041     Chief Complaint  Patient presents with  . Chest Pain   (Consider location/radiation/quality/duration/timing/severity/associated sxs/prior Treatment) HPI Comments: The patient is a 63 -year-old male with history of PCI placement in October who presents today with chest pain. His chest pain began around 9 AM. The chest pain as left-sided and is there constantly. He has had frequent sharp bursts of pain that lasts approximately 20 seconds. It does not hurt for him to take a deep breath in. He was driving in the car when his chest pain began. He went to cardiac  Rehabilitation this morning and finished around 8 AM. He denies any nausea, vomiting, shortness of breath, diaphoresis. He reports that this feels different than his chest pain associated with his prior blockages. He took his normal dose of 81 mg of aspirin this morning and 2 nitros without relief of his pain.  Patient is a 63 y.o. male presenting with chest pain. The history is provided by the patient. No language interpreter was used.  Chest Pain Associated symptoms: no abdominal pain, no diaphoresis, no fever, no nausea, no shortness of breath and not vomiting     Past Medical History  Diagnosis Date  . Restless leg   . Cervicalgia   . Loss of weight   . Impotence of organic origin   . Umbilical hernia without mention of obstruction or gangrene   . Barrett's esophagus   . Unspecified gastritis and gastroduodenitis with hemorrhage   . Hyperlipidemia   . Other malaise and fatigue   . Hypoglycemia, unspecified   . GERD (gastroesophageal reflux disease)   . Complication of anesthesia     " i DO NOT WAKE UP VERY WELL "  . PONV (postoperative nausea and vomiting)   . Hypertension   . Heart murmur     "grew out of it" (12/15/2012)  . Chronic bronchitis     "not bad lately; used to get it 3-4 times/yr"  (12/15/2012)  . IDDM (insulin dependent diabetes mellitus)   . H/O hiatal hernia   . Tension headache   . Headache     "weekly" (12/15/2012)  . COPD (chronic obstructive pulmonary disease)    Past Surgical History  Procedure Laterality Date  . Knee surgery Right 1992 and 2000    "calcium deposits removed" (12/15/2012)  . Coronary angioplasty with stent placement  12/15/2012    "2 stents" (12/15/2012)  . Hernia repair  2010    "umbilical" (12/15/2012)  . Incision and drainage abscess Left 05/2007    "groin" (12/15/2012)   History reviewed. No pertinent family history. History  Substance Use Topics  . Smoking status: Never Smoker   . Smokeless tobacco: Never Used  . Alcohol Use: No    Review of Systems  Constitutional: Negative for fever, chills and diaphoresis.  Respiratory: Negative for shortness of breath.   Cardiovascular: Positive for chest pain.  Gastrointestinal: Negative for nausea, vomiting and abdominal pain.  All other systems reviewed and are negative.    Allergies  Review of patient's allergies indicates no known allergies.  Home Medications   Current Outpatient Rx  Name  Route  Sig  Dispense  Refill  . acetaminophen (TYLENOL) 500 MG tablet   Oral   Take 1,000-1,500 mg by mouth every 6 (six) hours as needed for headache.         Marland Kitchen  aspirin 81 MG EC tablet   Oral   Take 1 tablet (81 mg total) by mouth daily.   60 tablet   0   . atorvastatin (LIPITOR) 20 MG tablet   Oral   Take 20 mg by mouth every morning.          . BD PEN NEEDLE NANO U/F 32G X 4 MM MISC      Use one needle daily with Lantus pen   100 each   12     Dispense as written.   . carvedilol (COREG) 6.25 MG tablet   Oral   Take 6.25 mg by mouth 2 (two) times daily with a meal.         . Insulin Glargine (LANTUS) 100 UNIT/ML Solostar Pen      20 units in the morning and 20 units in the evening.         . nitroGLYCERIN (NITROSTAT) 0.4 MG SL tablet   Sublingual   Place  0.4 mg under the tongue every 5 (five) minutes as needed for chest pain.         . pantoprazole (PROTONIX) 40 MG tablet   Oral   Take 40 mg by mouth 2 (two) times daily.         . pramipexole (MIRAPEX) 0.75 MG tablet   Oral   Take 0.375-0.75 mg by mouth at bedtime as needed (for restless leg).         . prasugrel (EFFIENT) 10 MG TABS tablet   Oral   Take 1 tablet (10 mg total) by mouth daily.   30 tablet   0    BP 129/68  Pulse 65  Temp(Src) 98.5 F (36.9 C) (Oral)  Resp 18  Ht 5\' 8"  (1.727 m)  Wt 184 lb (83.462 kg)  BMI 27.98 kg/m2  SpO2 98% Physical Exam  Nursing note and vitals reviewed. Constitutional: He is oriented to person, place, and time. He appears well-developed and well-nourished. No distress.  HENT:  Head: Normocephalic and atraumatic.  Right Ear: External ear normal.  Left Ear: External ear normal.  Nose: Nose normal.  Eyes: Conjunctivae are normal.  Neck: Normal range of motion. No tracheal deviation present.  Cardiovascular: Normal rate, regular rhythm and normal heart sounds.   Pulmonary/Chest: Effort normal and breath sounds normal. No stridor. He exhibits no tenderness.  Abdominal: Soft. He exhibits no distension. There is no tenderness.  Musculoskeletal: Normal range of motion.  Neurological: He is alert and oriented to person, place, and time.  Skin: Skin is warm and dry. He is not diaphoretic.  Psychiatric: He has a normal mood and affect. His behavior is normal.    ED Course  Procedures (including critical care time) Labs Review Labs Reviewed  CBC - Abnormal; Notable for the following:    MCHC 36.2 (*)    Platelets 118 (*)    All other components within normal limits  BASIC METABOLIC PANEL - Abnormal; Notable for the following:    Glucose, Bld 120 (*)    GFR calc non Af Amer 73 (*)    GFR calc Af Amer 84 (*)    All other components within normal limits  HEPARIN LEVEL (UNFRACTIONATED)  POCT I-STAT TROPONIN I   Imaging  Review Dg Chest 2 View  03/26/2013   CLINICAL DATA:  Chest pain.  EXAM: CHEST  2 VIEW  COMPARISON:  Chest x-ray 12/20/2012.  FINDINGS: Mediastinum and hilar structures are normal. Lungs are clear. Heart size normal. Degenerative  changes thoracic spine. Degenerative changes both shoulders.  IMPRESSION: No active cardiopulmonary disease.   Electronically Signed   By: Maisie Fus  Register   On: 03/26/2013 11:30    EKG Interpretation    Date/Time:  Friday March 26 2013 10:35:39 EST Ventricular Rate:  70 PR Interval:  134 QRS Duration: 92 QT Interval:  422 QTC Calculation: 455 R Axis:   7 Text Interpretation:  Normal sinus rhythm Low voltage QRS Cannot rule out Anterior infarct , age undetermined Abnormal ECG No significant change since last tracing Confirmed by GOLDSTON  MD, SCOTT ([id number]) on 03/26/2013 1:18:33 PM            MDM   1. Chest pain    Concern for cardiac etiology of Chest Pain. Cardiology has been consulted and will see patient for admission. Dr. [name] asked to start heparin and nitro drip for pain. Dr. [name] will decide if he needs to perform a cardiac cath on patient. Pt has been re-evaluated prior to consult and VSS, NAD, lungs CTAB. No acute abnormalities found on EKG and first round of cardiac enzymes negative. This case was discussed with Dr. Criss Alvine who has seen the patient and agrees with plan to admit.      Mora Bellman, PA-C 03/26/13 1507

## 2013-03-26 NOTE — Interval H&P Note (Signed)
History and Physical Interval Note:  03/26/2013 4:35 PM  [name]  has presented today for surgery, with the diagnosis of Chest pain  The various methods of treatment have been discussed with the patient and family. After consideration of risks, benefits and other options for treatment, the patient has consented to  Procedure(s): LEFT HEART CATHETERIZATION WITH CORONARY ANGIOGRAM (N/A) and possible PCI as a surgical intervention .  The patient's history has been reviewed, patient examined, no change in status, stable for surgery.  I have reviewed the patient's chart and labs.  Questions were answered to the patient's satisfaction.     [name],JAGADEESH R

## 2013-03-26 NOTE — ED Notes (Signed)
Pt reports that he started having left sided chest pain that started this morning around 0900. Denies any n/v, SOB or diaphoresis. Reports that he just had two stents placed and two opened with balloons. Reports he took 2 nitro and 1 81mg  ASA. No relief from the nitro.

## 2013-03-26 NOTE — CV Procedure (Signed)
Procedure performed:  Left heart catheterization including hemodynamic monitoring of the left ventricle, LV gram, selective right and left coronary arteriography. FFR evaluation of the intermediate circumflex coronary lesion.  Indication: Patient is a 63 year old Caucasian male with history of hypertension, hyperlipidemia, diabetes mellitus who has history of known coronary artery disease and has undergone multivessel coronary angioplasty on 12/15/2012 which included stenting of the proximal and midsegment of the LAD with implantation of a 2.75 x 38 mm promos Premier drug-eluting stent, balloon angioplasty of the D1 segment of the LAD with a 2.75 x 20 mm Empera balloon. Balloon angioplasty of the distal LAD  with 2.0 x 15 mm mini trek balloon. Stenting of the distal RCA with implantation of a 2.75 x 24 mm promos Premier drug-eluting stent. Patient has residual mid circumflex bifurcating 50-60% stenosis. This was treated medically.  He has had admission about 3 month ago with chest pain to the hospital he was ruled out for myocardial infarction, with negative EKG changes he was discharged home. He is now readmitted to the hospital with ongoing chest discomfort cyst of unstable angina, although EKG did not really significant changes, do to ongoing chest discomfort he was brought to the cardiac catheterization lab to reevaluate his coronary anatomy given the complexity of his coronary anatomy.  Hemodynamic data:  Left ventricular pressure was 107/11 with LVEDP of 22 mm mercury. Aortic pressure was 124/74 with a mean of 96 mm mercury. There was no pressure gradient across the aortic valve  Left ventricle: Performed in the RAO projection revealed LVEF of 65%. There was No MR. No wall motion abnormality.  Right coronary artery:  Dominant. Midsegment of the right carotid shows a 30-40% stenosis. The previously placed distal RCA stent on 12/15/2012,  2.75 x 24 mm promos Premier drug-eluting stent is widely  patent. Mild disease is noted in the PDA.  Left main coronary artery: Non-existent. Circumflex and LAD has separate ostia.  Circumflex coronary artery: A large vessel giving origin to a large obtuse marginal 1. Mid circumflex has a bifurcating 60% stenosis with mild haziness. This is at the bifurcation of the OM 1 which is very large and AV groove branch. This appears to be not significantly changed from prior coronary angiography.  LAD:  LAD gives origin to a large diagonal-1.  LAD has mild diffuse luminal irregularities. Previously placed stents on 12/15/2012 in  proximal and midsegment of the LAD with implantation of a 2.75 x 38 mm promos Premier drug-eluting stent, balloon angioplasty of the D1 distal LAD is widely patent.  FFR to mid circumflex coronary artery: No significant change in the FFR. Lesion felt to be stable and not hemodynamically significant.  Impression: Widely patent previously placed stent, coronary arteries have improved significantly from diffuse disease to much improved flow throughout the vessels. Mid circumflex has a 60% stenosis which does not appear to be hemodynamically significant. The anatomy is not significantly changed in this vessel from prior coronary angiography. Evaluation for noncardiac cause of chest pain is indicated. Patient to discharge home in the morning if he remains stable. A total of 100 cc of contrast was utilized for diagnostic angiography.   Technique: Under sterile precautions using a 6 French right radial  arterial access, a 6 French sheath was introduced into the right radial artery. A 5 [place]French Tig 4 catheter was advanced into the ascending aorta selective  right coronary artery and left coronary artery was cannulated and angiography was performed in multiple views. The catheter was pulled back  Out of the body over exchange length J-wire. Same Catheter was used to perform LV gram which was performed in RAO projection. Catheter exchanged out of the  body over J-Wire.   A 6 French XB 4.0 guide catheter was utilized to engage the circumflex coronary artery. I then utilized  Prime wire Pro Volcano FFR wire to perform FFR to the circumflex carotid artery in the standard fashion using intravenous administration of adenosine and keeping the ACT greater than 250 seconds. The wire was withdrawn angiography repeated and guide catheter disengaged and pulled out of the body over J-wire. Prior to performing any procedures, multiple nitroglycerin administration was performed pre-and post procedure. Patient tolerated procedure. No immediate complications. Hemostasis was obtained by applying TR band.

## 2013-03-27 LAB — CBC
HCT: 36.5 % — ABNORMAL LOW (ref 39.0–52.0)
Hemoglobin: 12.8 g/dL — ABNORMAL LOW (ref 13.0–17.0)
MCH: 30.4 pg (ref 26.0–34.0)
MCHC: 35.1 g/dL (ref 30.0–36.0)
MCV: 86.7 fL (ref 78.0–100.0)
Platelets: 108 10*3/uL — ABNORMAL LOW (ref 150–400)
RBC: 4.21 MIL/uL — AB (ref 4.22–5.81)
RDW: 12.7 % (ref 11.5–15.5)
WBC: 4.5 10*3/uL (ref 4.0–10.5)

## 2013-03-27 LAB — GLUCOSE, CAPILLARY: Glucose-Capillary: 84 mg/dL (ref 70–99)

## 2013-03-27 LAB — TROPONIN I

## 2013-03-27 NOTE — ED Provider Notes (Signed)
Medical screening examination/treatment/procedure(s) were conducted as a shared visit with non-physician practitioner(s) and myself.  I personally evaluated the patient during the encounter.  EKG Interpretation    Date/Time:  Friday March 26 2013 10:35:39 EST Ventricular Rate:  70 PR Interval:  134 QRS Duration: 92 QT Interval:  422 QTC Calculation: 455 R Axis:   7 Text Interpretation:  Normal sinus rhythm Low voltage QRS Cannot rule out Anterior infarct , age undetermined Abnormal ECG No significant change since last tracing Confirmed by Taralynn Quiett  MD, Caralee Morea ([id number]) on 03/26/2013 1:18:33 PM            Patient with uncontrolled chest pain. Trop normal, no STEMI. Very concerning for UA. Given this, after discussion with cardiology he was placed on both heparin gtt and nitro gtt. Will be taken to the cath lab by cards. Otherwise his vitals are stable.  CRITICAL CARE Performed by: Pricilla LovelessGOLDSTON, Daxton Nydam T   Total critical care time: 30 minutes  Critical care time was exclusive of separately billable procedures and treating other patients.  Critical care was necessary to treat or prevent imminent or life-threatening deterioration.  Critical care was time spent personally by me on the following activities: development of treatment plan with patient and/or surrogate as well as nursing, discussions with consultants, evaluation of patient's response to treatment, examination of patient, obtaining history from patient or surrogate, ordering and performing treatments and interventions, ordering and review of laboratory studies, ordering and review of radiographic studies, pulse oximetry and re-evaluation of patient's condition.   Audree CamelScott T Roddy Bellamy, MD 03/27/13 1137

## 2013-03-27 NOTE — Discharge Summary (Signed)
Physician Discharge Summary  Patient ID: [name] MRN: [id number] DOB/AGE: February 18, 1951 63 y.o.  Admit date: 03/26/2013 Discharge date: 03/27/2013  Primary Discharge Diagnosis Chest pain probably referred pain from cervical spine disease-Cervicalgia   Secondary Discharge Diagnosis CAD H/O PTCA DM-II Controlled HTN Hyperlipidemia THrombocytopenia  Significant Diagnostic Studies: 03/26/2012: Hemodynamic data:  Left ventricular pressure was 107/11 with LVEDP of 22 mm mercury. Aortic pressure was 124/74 with a mean of 96 mm mercury. There was no pressure gradient across the aortic valve  Left ventricle: Performed in the RAO projection revealed LVEF of 65%. There was No MR. No wall motion abnormality.  Right coronary artery: Dominant. Midsegment of the right carotid shows a 30-40% stenosis. The previously placed distal RCA stent on 12/15/2012, 2.75 x 24 mm promos Premier drug-eluting stent is widely patent. Mild disease is noted in the PDA.  Left main coronary artery: Non-existent. Circumflex and LAD has separate ostia.  Circumflex coronary artery: A large vessel giving origin to a large obtuse marginal 1. Mid circumflex has a bifurcating 60% stenosis with mild haziness. This is at the bifurcation of the OM 1 which is very large and AV groove branch. This appears to be not significantly changed from prior coronary angiography.  LAD: LAD gives origin to a large diagonal-1. LAD has mild diffuse luminal irregularities. Previously placed stents on 12/15/2012 in proximal and midsegment of the LAD with implantation of a 2.75 x 38 mm promos Premier drug-eluting stent, balloon angioplasty of the D1 distal LAD is widely patent.  FFR to mid circumflex coronary artery: No significant change in the FFR. Lesion felt to be stable and not hemodynamically significant.  Impression: Widely patent previously placed stent, coronary arteries have improved significantly from diffuse disease to much improved flow  throughout the vessels. Mid circumflex has a 60% stenosis which does not appear to be hemodynamically significant. The anatomy is not significantly changed in this vessel from prior coronary angiography. Evaluation for noncardiac cause of chest pain is indicated. Patient to discharge home in the morning if he remains stable. A total of 100 cc of contrast was utilized for diagnostic angiography.  Hospital Course:  Patient is a 63 year old Caucasian male with history of hypertension, hyperlipidemia, diabetes mellitus who has history of known coronary artery disease and has undergone multivessel coronary angioplasty on 12/15/2012 which included stenting of the proximal and midsegment of the LAD with implantation of a 2.75 x 38 mm promos Premier drug-eluting stent, balloon angioplasty of the D1 segment of the LAD with a 2.75 x 20 mm Empera balloon. Balloon angioplasty of the distal LAD with 2.0 x 15 mm mini trek balloon. Stenting of the distal RCA with implantation of a 2.75 x 24 mm promos Premier drug-eluting stent. Patient has residual mid circumflex bifurcating 50-60% stenosis. This was treated medically.   Presented with atypical chest pain, but due to ongoing chest pain and h/o complex multivessel PCI in the past was admitted to the hospital and underwent coronary angio the same day.  Found to have widely patent stents and balloon PTCA sites and vessels had in fact nicely remodeled and for intermediate circumflex disease of 60%, underwent FFR and found to be insignificant hemodynamically.  Discharged on home medications. Patient can resume cardiac rehab.   Discharge Exam: Blood pressure 97/59, pulse 56, temperature 97.5 F (36.4 C), temperature source Oral, resp. rate 18, height 5\' 8"  (1.727 m), weight 82 kg (180 lb 12.4 oz), SpO2 97.00%.  General: Well built and normal body habitus who  is in no acute distress. Appears stated age. Alert Ox3.  There is no cyanosis. HEENT: normal limits. PERRLA, No  JVD.  CARDIAC EXAM: S1, S2 normal, no gallop present. No murmur.  CHEST EXAM: No tenderness of chest wall. LUNGS: Clear to percuss and auscultate.  ABDOMEN: No hepatosplenomegaly. BS normal in all 4 quadrants. Abdomen is non-tender.  EXTREMITY: Full range of movementes, No edema. MUSCULOSKELETAL EXAM: Intact with full range of motion in all 4 extremities.  NEUROLOGIC EXAM: Grossly intact without any focal deficits. Alert O x 3.  VASCULAR EXAM: No skin breakdown. Carotids normal. Extremities: Femoral  Normal and normal pedals. Right radial access site has preserved pulse.   Labs:   Lab Results  Component Value Date   WBC 4.5 03/27/2013   HGB 12.8* 03/27/2013   HCT 36.5* 03/27/2013   MCV 86.7 03/27/2013   PLT 108* 03/27/2013    Recent Labs Lab 03/26/13 1148  NA 141  K 4.1  CL 105  CO2 25  BUN 19  CREATININE 1.06  CALCIUM 9.0  GLUCOSE 120*   Lab Results  Component Value Date   TROPONINI <0.30 03/27/2013    Lipid Panel     Component Value Date/Time   CHOL 116 12/21/2012 0240   TRIG 160* 12/21/2012 0240   HDL 33* 12/21/2012 0240   CHOLHDL 3.5 12/21/2012 0240   VLDL 32 12/21/2012 0240   LDLCALC 51 12/21/2012 0240    03/26/13: EKG Normal sinus rhythm at rate of 70 bpm, poor R-wave progression, no evidence of ischemia. No change from 11/19/2012.  Radiology: Dg Chest 2 View  03/26/2013   CLINICAL DATA:  Chest pain.  EXAM: CHEST  2 VIEW  COMPARISON:  Chest x-ray 12/20/2012.  FINDINGS: Mediastinum and hilar structures are normal. Lungs are clear. Heart size normal. Degenerative changes thoracic spine. Degenerative changes both shoulders.  IMPRESSION: No active cardiopulmonary disease.   Electronically Signed   By: Maisie Fushomas  Register   On: 03/26/2013 11:30      FOLLOW UP PLANS AND APPOINTMENTS  Future Appointments Provider Department Dept Phone   03/29/2013 6:45 AM Mc[facility]Phase2 Monitor [facility] [place] [facility] [phone]   03/31/2013 6:45 AM Mc-Phase2 Monitor 20  [facility] [place] [facility] [phone]   04/02/2013 6:45 AM Mc[facility]Phase2 Monitor [facility] [place] [facility] [phone]   04/05/2013 6:45 AM Mc-Phase2 Monitor [facility] [place] [facility] [phone]   04/07/2013 6:45 AM Mc-Phase2 Monitor 20 [facility] [place] [facility] [phone]   04/09/2013 6:45 AM [facility]Phase[facility] [place] [facility] [phone]   04/12/2013 6:45 AM Mc-Phase2 Monitor 20 [facility] [place] [facility] [phone]   04/14/2013 6:45 AM Mc-Phase2 Monitor 20 [facility] [place] [facility] [phone]   04/16/2013 6:45 AM Mc-Phase2 Monitor [facility] [place] [facility] [phone]   04/19/2013 6:45 AM Mc[facility]Phase2 Monitor [facility] [place] [facility] [phone]   04/21/2013 6:45 AM Mc-Phase2 Monitor 20 [facility] [place] [facility] [phone]   04/23/2013 6:45 AM Mc-Phase2 Monitor 20 [facility] [place] [facility] [phone]   04/26/2013 6:45 AM Mc-Phase2 Monitor 20 [facility] [place] [facility] [phone]   04/28/2013 6:45 AM Mc-Phase2 Monitor 20 [facility] [place] [facility] [phone]   04/30/2013 6:45 AM Mc-Phase2 Monitor 20 [facility] [place] [facility] [phone]   05/03/2013 6:45 AM Mc-Phase2 Monitor [facility] [phone]   05/05/2013 6:45  AM Mc-Phase2 Monitor 20 [facility] [phone]   05/07/2013 6:45 AM Mc-Phase2 Monitor 20 [facility]IAC [facility] [phone]   05/10/2013 6:45 AM Mc-Phase2 Monitor 20 [facility] CARDIAC [facility] [phone]   05/12/2013 6:45 AM Mc-Phase2 Monitor 20 [facility]IAC [facility] [phone]   05/14/2013 6:45 AM Mc-Phase2 Monitor 20 [facility] [phone]   06/09/2013 2:45 PM Kimber Relic, MD  [facility] [phone]       Medication List         acetaminophen 500 MG tablet  Commonly known as:  TYLENOL  Take 1,000-1,500 mg by mouth every 6 (six) hours as needed for headache.     aspirin 81 MG EC tablet  Take 1 tablet (81 mg total) by mouth daily.     atorvastatin 20 MG tablet  Commonly known as:  LIPITOR  Take 20 mg by mouth every morning.     BD PEN NEEDLE NANO U/F 32G X 4 MM Misc  Generic drug:  Insulin Pen Needle  Use one needle daily with Lantus pen     carvedilol 6.25 MG tablet  Commonly known as:  COREG  Take 6.25 mg by mouth 2 (two) times daily with a meal.     Insulin Glargine 100 UNIT/ML Solostar Pen  Commonly known as:  LANTUS  20 units in the morning and 20 units in the evening.     nitroGLYCERIN 0.4 MG SL tablet  Commonly known as:  NITROSTAT  Place 0.4 mg under the tongue every 5 (five) minutes as needed for chest pain.     pantoprazole 40 MG tablet  Commonly known as:  PROTONIX  Take 40 mg by mouth 2 (two) times daily.     pramipexole 0.75 MG tablet  Commonly known as:  MIRAPEX  Take 0.375-0.75 mg by mouth at bedtime as needed (for restless leg).     prasugrel 10 MG Tabs tablet  Commonly known as:  EFFIENT  Take 1 tablet (10 mg total) by mouth daily.           Follow-up Information   Follow up with Pamella Pert, MD. (Keep previous appointment)    Specialty:  Cardiology   Contact information:   [address] [place] [address] [phone]        Pamella Pert, MD 03/27/2013, 10:25 AM  Pager: [phone] Office: [phone] If no answer: [phone]

## 2013-03-29 ENCOUNTER — Encounter (HOSPITAL_COMMUNITY)
Admission: RE | Admit: 2013-03-29 | Discharge: 2013-03-29 | Disposition: A | Discharge status: Home / Self Care | Payer: BC Managed Care – PPO | Source: Ambulatory Visit | Attending: Cardiology | Admitting: Cardiology

## 2013-03-29 NOTE — Progress Notes (Signed)
Pt rehab report sent with patient to take to Dr. Jacinto HalimGanji appointment today. Pt signed release of medical records

## 2013-03-29 NOTE — Progress Notes (Signed)
Pt returned to cardiac rehab today after being admitted  for chest pain.  Dr Jacinto HalimGanji gave clearance for pt to return to cardiac rehab upon discharge from hospital.  Pt pain free today.  Pt tolerated exercise without difficulty. Pt hypertensive with exercise, peak BP- 180/100, recheck 140/88.  Dr. Jacinto HalimGanji made aware.

## 2013-03-30 LAB — GLUCOSE, CAPILLARY: GLUCOSE-CAPILLARY: 73 mg/dL (ref 70–99)

## 2013-03-31 ENCOUNTER — Encounter (HOSPITAL_COMMUNITY)
Admission: RE | Admit: 2013-03-31 | Discharge: 2013-03-31 | Disposition: A | Discharge status: Home / Self Care | Payer: BC Managed Care – PPO | Source: Ambulatory Visit | Attending: Cardiology | Admitting: Cardiology

## 2013-04-02 ENCOUNTER — Encounter (HOSPITAL_COMMUNITY): Payer: BC Managed Care – PPO

## 2013-04-05 ENCOUNTER — Encounter (HOSPITAL_COMMUNITY)
Admission: RE | Admit: 2013-04-05 | Discharge: 2013-04-05 | Disposition: A | Discharge status: Home / Self Care | Payer: BC Managed Care – PPO | Source: Ambulatory Visit | Attending: Cardiology | Admitting: Cardiology

## 2013-04-05 DIAGNOSIS — Z7902 Long term (current) use of antithrombotics/antiplatelets: Secondary | ICD-10-CM | POA: Insufficient documentation

## 2013-04-05 DIAGNOSIS — Z9861 Coronary angioplasty status: Secondary | ICD-10-CM | POA: Insufficient documentation

## 2013-04-05 DIAGNOSIS — I2510 Atherosclerotic heart disease of native coronary artery without angina pectoris: Secondary | ICD-10-CM | POA: Insufficient documentation

## 2013-04-07 ENCOUNTER — Encounter (HOSPITAL_COMMUNITY)
Admission: RE | Admit: 2013-04-07 | Discharge: 2013-04-07 | Disposition: A | Discharge status: Home / Self Care | Payer: BC Managed Care – PPO | Source: Ambulatory Visit | Attending: Cardiology | Admitting: Cardiology

## 2013-04-07 NOTE — Progress Notes (Signed)
Nutrition Note Spoke with pt. Pt c/o hyperglycemia due to oral prednisone, which he will take for 4 more days. Fasting CBG this am 213 mg/dL. Pt states his MD is "OK with me adjusting my insulin to see what will work." Pt is going to increase bedtime insulin to 25 units and continue 20 units in the morning to see if blood sugars can be better controlled while on prednisone. Pt is to return to his 20 units of Lantus BID once prednisone taper is complete. Continue client-centered nutrition education by RD as part of interdisciplinary care.  Monitor and evaluate progress toward nutrition goal with team.  Mickle PlumbEdna Crosby Oriordan, M.Ed, RD, LDN, CDE 04/07/2013 8:38 AM

## 2013-04-09 ENCOUNTER — Encounter (HOSPITAL_COMMUNITY)
Admission: RE | Admit: 2013-04-09 | Discharge: 2013-04-09 | Disposition: A | Discharge status: Home / Self Care | Payer: BC Managed Care – PPO | Source: Ambulatory Visit | Attending: Cardiology | Admitting: Cardiology

## 2013-04-12 ENCOUNTER — Encounter (HOSPITAL_COMMUNITY)
Admission: RE | Admit: 2013-04-12 | Discharge: 2013-04-12 | Disposition: A | Discharge status: Home / Self Care | Payer: BC Managed Care – PPO | Source: Ambulatory Visit | Attending: Cardiology | Admitting: Cardiology

## 2013-04-14 ENCOUNTER — Encounter (HOSPITAL_COMMUNITY)
Admission: RE | Admit: 2013-04-14 | Discharge: 2013-04-14 | Disposition: A | Discharge status: Home / Self Care | Payer: BC Managed Care – PPO | Source: Ambulatory Visit | Attending: Cardiology | Admitting: Cardiology

## 2013-04-16 ENCOUNTER — Encounter (HOSPITAL_COMMUNITY)
Admission: RE | Admit: 2013-04-16 | Discharge: 2013-04-16 | Disposition: A | Discharge status: Home / Self Care | Payer: BC Managed Care – PPO | Source: Ambulatory Visit | Attending: Cardiology | Admitting: Cardiology

## 2013-04-19 ENCOUNTER — Encounter (HOSPITAL_COMMUNITY)
Admission: RE | Admit: 2013-04-19 | Discharge: 2013-04-19 | Disposition: A | Discharge status: Home / Self Care | Payer: BC Managed Care – PPO | Source: Ambulatory Visit | Attending: Cardiology | Admitting: Cardiology

## 2013-04-20 ENCOUNTER — Other Ambulatory Visit: Payer: Self-pay | Admitting: Internal Medicine

## 2013-04-21 ENCOUNTER — Encounter (HOSPITAL_COMMUNITY): Payer: BC Managed Care – PPO

## 2013-04-23 ENCOUNTER — Encounter (HOSPITAL_COMMUNITY): Payer: BC Managed Care – PPO

## 2013-04-26 ENCOUNTER — Encounter (HOSPITAL_COMMUNITY): Payer: BC Managed Care – PPO

## 2013-04-28 ENCOUNTER — Encounter (HOSPITAL_COMMUNITY): Payer: BC Managed Care – PPO

## 2013-04-28 ENCOUNTER — Telehealth (HOSPITAL_COMMUNITY): Payer: Self-pay | Admitting: *Deleted

## 2013-04-28 NOTE — Telephone Encounter (Signed)
Called to inquire regarding absence from rehab.  Pt with shoulder issues and could not attend exercise.  Pt hopes to come on Friday.  Told pt that we could modify equipment as needed.

## 2013-04-30 ENCOUNTER — Emergency Department (HOSPITAL_COMMUNITY)
Admission: EM | Admit: 2013-04-30 | Discharge: 2013-04-30 | Disposition: A | Discharge status: Other Acute Inpatient Hospital | Payer: BC Managed Care – PPO | Source: Home / Self Care | Attending: Family Medicine | Admitting: Family Medicine

## 2013-04-30 ENCOUNTER — Encounter (HOSPITAL_COMMUNITY): Payer: Self-pay | Admitting: Emergency Medicine

## 2013-04-30 ENCOUNTER — Encounter (HOSPITAL_COMMUNITY): Admission: RE | Admit: 2013-04-30 | Payer: BC Managed Care – PPO | Source: Ambulatory Visit

## 2013-04-30 ENCOUNTER — Emergency Department (HOSPITAL_COMMUNITY)
Admission: EM | Admit: 2013-04-30 | Discharge: 2013-04-30 | Disposition: A | Discharge status: Home / Self Care | Payer: BC Managed Care – PPO | Attending: Emergency Medicine | Admitting: Emergency Medicine

## 2013-04-30 ENCOUNTER — Emergency Department (HOSPITAL_COMMUNITY): Payer: BC Managed Care – PPO

## 2013-04-30 DIAGNOSIS — I959 Hypotension, unspecified: Secondary | ICD-10-CM

## 2013-04-30 DIAGNOSIS — I10 Essential (primary) hypertension: Secondary | ICD-10-CM | POA: Insufficient documentation

## 2013-04-30 DIAGNOSIS — Z79899 Other long term (current) drug therapy: Secondary | ICD-10-CM | POA: Insufficient documentation

## 2013-04-30 DIAGNOSIS — Z9861 Coronary angioplasty status: Secondary | ICD-10-CM | POA: Insufficient documentation

## 2013-04-30 DIAGNOSIS — K219 Gastro-esophageal reflux disease without esophagitis: Secondary | ICD-10-CM | POA: Insufficient documentation

## 2013-04-30 DIAGNOSIS — J40 Bronchitis, not specified as acute or chronic: Secondary | ICD-10-CM

## 2013-04-30 DIAGNOSIS — E785 Hyperlipidemia, unspecified: Secondary | ICD-10-CM | POA: Insufficient documentation

## 2013-04-30 DIAGNOSIS — E119 Type 2 diabetes mellitus without complications: Secondary | ICD-10-CM | POA: Insufficient documentation

## 2013-04-30 DIAGNOSIS — Z792 Long term (current) use of antibiotics: Secondary | ICD-10-CM | POA: Insufficient documentation

## 2013-04-30 DIAGNOSIS — R011 Cardiac murmur, unspecified: Secondary | ICD-10-CM | POA: Insufficient documentation

## 2013-04-30 DIAGNOSIS — Z794 Long term (current) use of insulin: Secondary | ICD-10-CM | POA: Insufficient documentation

## 2013-04-30 DIAGNOSIS — Z7902 Long term (current) use of antithrombotics/antiplatelets: Secondary | ICD-10-CM | POA: Insufficient documentation

## 2013-04-30 DIAGNOSIS — Z7982 Long term (current) use of aspirin: Secondary | ICD-10-CM | POA: Insufficient documentation

## 2013-04-30 DIAGNOSIS — J209 Acute bronchitis, unspecified: Secondary | ICD-10-CM | POA: Insufficient documentation

## 2013-04-30 DIAGNOSIS — Z8739 Personal history of other diseases of the musculoskeletal system and connective tissue: Secondary | ICD-10-CM | POA: Insufficient documentation

## 2013-04-30 DIAGNOSIS — Z8669 Personal history of other diseases of the nervous system and sense organs: Secondary | ICD-10-CM | POA: Insufficient documentation

## 2013-04-30 DIAGNOSIS — J440 Chronic obstructive pulmonary disease with acute lower respiratory infection: Secondary | ICD-10-CM | POA: Insufficient documentation

## 2013-04-30 LAB — I-STAT CHEM 8, ED
BUN: 15 mg/dL (ref 6–23)
CREATININE: 1.1 mg/dL (ref 0.50–1.35)
Calcium, Ion: 1.14 mmol/L (ref 1.13–1.30)
Chloride: 101 mEq/L (ref 96–112)
Glucose, Bld: 171 mg/dL — ABNORMAL HIGH (ref 70–99)
HCT: 35 % — ABNORMAL LOW (ref 39.0–52.0)
Hemoglobin: 11.9 g/dL — ABNORMAL LOW (ref 13.0–17.0)
Potassium: 3.9 mEq/L (ref 3.7–5.3)
SODIUM: 139 meq/L (ref 137–147)
TCO2: 24 mmol/L (ref 0–100)

## 2013-04-30 LAB — GLUCOSE, CAPILLARY: Glucose-Capillary: 127 mg/dL — ABNORMAL HIGH (ref 70–99)

## 2013-04-30 LAB — I-STAT TROPONIN, ED: Troponin i, poc: 0 ng/mL (ref 0.00–0.08)

## 2013-04-30 MED ORDER — ASPIRIN 81 MG PO CHEW
324.0000 mg | CHEWABLE_TABLET | Freq: Once | ORAL | Status: AC
  Administered 2013-04-30: 324 mg via ORAL

## 2013-04-30 MED ORDER — SODIUM CHLORIDE 0.9 % IV SOLN
Freq: Once | INTRAVENOUS | Status: AC
  Administered 2013-04-30: 11:00:00 via INTRAVENOUS

## 2013-04-30 MED ORDER — ALBUTEROL SULFATE (2.5 MG/3ML) 0.083% IN NEBU
INHALATION_SOLUTION | RESPIRATORY_TRACT | Status: AC
  Filled 2013-04-30: qty 6

## 2013-04-30 MED ORDER — NITROGLYCERIN 0.4 MG SL SUBL: 0.4000 mg | SUBLINGUAL_TABLET | SUBLINGUAL | Status: DC | PRN

## 2013-04-30 MED ORDER — IPRATROPIUM-ALBUTEROL 0.5-2.5 (3) MG/3ML IN SOLN
3.0000 mL | Freq: Once | RESPIRATORY_TRACT | Status: AC
  Administered 2013-04-30: 3 mL via RESPIRATORY_TRACT

## 2013-04-30 MED ORDER — ALBUTEROL SULFATE HFA 108 (90 BASE) MCG/ACT IN AERS
2.0000 | INHALATION_SPRAY | RESPIRATORY_TRACT | Status: DC | PRN
  Administered 2013-04-30: 2 via RESPIRATORY_TRACT
  Filled 2013-04-30: qty 6.7

## 2013-04-30 MED ORDER — ASPIRIN 81 MG PO CHEW
CHEWABLE_TABLET | ORAL | Status: AC
  Filled 2013-04-30: qty 4

## 2013-04-30 MED ORDER — CLARITHROMYCIN 500 MG PO TABS: 500.0000 mg | ORAL_TABLET | Freq: Two times a day (BID) | ORAL | Status: DC

## 2013-04-30 MED ORDER — AEROCHAMBER Z-STAT PLUS/MEDIUM MISC
1.0000 | Freq: Once | Status: AC
  Administered 2013-04-30: 1
  Filled 2013-04-30: qty 1

## 2013-04-30 MED ORDER — IPRATROPIUM BROMIDE 0.02 % IN SOLN
RESPIRATORY_TRACT | Status: AC
  Filled 2013-04-30: qty 2.5

## 2013-04-30 NOTE — Discharge Instructions (Signed)

## 2013-04-30 NOTE — ED Notes (Signed)
States was up all night last PM due to cough, SOB. Presented today w weakness, feeling as if he was having an insulin reaction. On triage, pt pale, dry to touch, near lethargic, alert. MD advised on arrival

## 2013-04-30 NOTE — ED Notes (Signed)
Pt comfortable with d/c and follow up instructions. Prescriptions x1 

## 2013-04-30 NOTE — ED Provider Notes (Signed)
CSN: [id number]     Arrival date & time 04/30/13  1109 History   First MD Initiated Contact with Patient 04/30/13 1111     Chief Complaint  Patient presents with  . Shortness of Breath     (Consider location/radiation/quality/duration/timing/severity/associated sxs/prior Treatment) Patient is a 63 y.o. male presenting with shortness of breath. The history is provided by the patient.  Shortness of Breath  He presents for evaluation of persistent cough, and a sensation of "bronchitis". His cough kept him awake all night, it is nonproductive. He does not have associated fever, chills, nausea, vomiting, persistent, weakness, back pain, chest pain or abdominal pain. He feels that if he takes Biaxin, he will get better. He does have bronchitis, treated with Biaxin about 6 months ago. He had a cardiac catheterization in January 2015 that was, "okay", and his cardiac stents were patent. He is otherwise been doing well since then. He presented to urgent care today where he felt lightheaded so he was sent here. He took his usual medications this morning. There are no other known modifying factors.  Past Medical History  Diagnosis Date  . Restless leg   . Cervicalgia   . Loss of weight   . Impotence of organic origin   . Umbilical hernia without mention of obstruction or gangrene   . Barrett's esophagus   . Unspecified gastritis and gastroduodenitis with hemorrhage   . Hyperlipidemia   . Other malaise and fatigue   . Hypoglycemia, unspecified   . GERD (gastroesophageal reflux disease)   . Complication of anesthesia     " i DO NOT WAKE UP VERY WELL "  . PONV (postoperative nausea and vomiting)   . Hypertension   . Heart murmur     "grew out of it" (12/15/2012)  . Chronic bronchitis     "not bad lately; used to get it 3-4 times/yr" (12/15/2012)  . IDDM (insulin dependent diabetes mellitus)   . H/O hiatal hernia   . Tension headache   . Headache     "weekly" (12/15/2012)  . COPD (chronic  obstructive pulmonary disease)    Past Surgical History  Procedure Laterality Date  . Knee surgery Right 1992 and 2000    "calcium deposits removed" (12/15/2012)  . Coronary angioplasty with stent placement  12/15/2012    "2 stents" (12/15/2012)  . Hernia repair  2010    "umbilical" (12/15/2012)  . Incision and drainage abscess Left 05/2007    "groin" (12/15/2012)   No family history on file. History  Substance Use Topics  . Smoking status: Never Smoker   . Smokeless tobacco: Never Used  . Alcohol Use: No    Review of Systems  Respiratory: Positive for shortness of breath.   All other systems reviewed and are negative.      Allergies  Review of patient's allergies indicates no known allergies.  Home Medications   Current Outpatient Rx  Name  Route  Sig  Dispense  Refill  . acetaminophen (TYLENOL) 500 MG tablet   Oral   Take 1,000-1,500 mg by mouth every 6 (six) hours as needed for headache.         Marland Kitchen aspirin 81 MG EC tablet   Oral   Take 1 tablet (81 mg total) by mouth daily.   60 tablet   0   . atorvastatin (LIPITOR) 20 MG tablet   Oral   Take 20 mg by mouth every morning.          . carvedilol (  COREG) 6.25 MG tablet   Oral   Take 6.25 mg by mouth 2 (two) times daily with a meal.         . cyclobenzaprine (FLEXERIL) 5 MG tablet   Oral   Take 5 mg by mouth at bedtime.          . Insulin Glargine (LANTUS) 100 UNIT/ML Solostar Pen   Subcutaneous   Inject 20 Units into the skin 2 (two) times daily before lunch and supper. 20 units in the morning and 20 units in the evening.         . pantoprazole (PROTONIX) 40 MG tablet   Oral   Take 40 mg by mouth 2 (two) times daily.         . pramipexole (MIRAPEX) 0.75 MG tablet   Oral   Take 0.375-0.75 mg by mouth at bedtime as needed (for restless leg).         . prasugrel (EFFIENT) 10 MG TABS tablet   Oral   Take 1 tablet (10 mg total) by mouth daily.   30 tablet   0   . BD PEN NEEDLE NANO  U/F 32G X 4 MM MISC      Use one needle daily with Lantus pen   100 each   12     Dispense as written.   . clarithromycin (BIAXIN) 500 MG tablet   Oral   Take 1 tablet (500 mg total) by mouth 2 (two) times daily.   20 tablet   0   . nitroGLYCERIN (NITROSTAT) 0.4 MG SL tablet   Sublingual   Place 0.4 mg under the tongue every 5 (five) minutes as needed for chest pain.         . ONE TOUCH ULTRA TEST test strip      TEST TWICE A DAY   100 each   1    BP 125/73  Pulse 69  Temp(Src) 98.8 F (37.1 C) (Oral)  Resp 20  SpO2 93% Physical Exam  Nursing note and vitals reviewed. Constitutional: He is oriented to person, place, and time. He appears well-developed and well-nourished. No distress.  HENT:  Head: Normocephalic and atraumatic.  Right Ear: External ear normal.  Left Ear: External ear normal.  Eyes: Conjunctivae and EOM are normal. Pupils are equal, round, and reactive to light.  Neck: Normal range of motion and phonation normal. Neck supple.  Cardiovascular: Normal rate, regular rhythm, normal heart sounds and intact distal pulses.   Pulmonary/Chest: Effort normal and breath sounds normal. No respiratory distress. He has no wheezes. He has no rales. He exhibits no tenderness and no bony tenderness.  Abdominal: Soft. Normal appearance. There is no tenderness.  Musculoskeletal: Normal range of motion.  Neurological: He is alert and oriented to person, place, and time. No cranial nerve deficit or sensory deficit. He exhibits normal muscle tone. Coordination normal.  Skin: Skin is warm, dry and intact.  Psychiatric: His behavior is normal. Judgment and thought content normal.  He appears somewhat anxious    ED Course  Procedures (including critical care time)  Medications  albuterol (PROVENTIL HFA;VENTOLIN HFA) 108 (90 BASE) MCG/ACT inhaler 2 puff (2 puffs Inhalation Given 04/30/13 1226)  aerochamber Z-Stat Plus/medium 1 each (not administered)    Patient Vitals  for the past 24 hrs:  BP Temp Temp src Pulse Resp SpO2  04/30/13 1226 - - - - - 93 %  04/30/13 1215 125/73 mmHg - - 69 20 92 %  04/30/13 1200 117/73  mmHg - - 66 16 93 %  04/30/13 1155 125/74 mmHg - - 66 15 97 %  04/30/13 1135 120/72 mmHg - - 81 16 95 %  04/30/13 1116 120/70 mmHg 98.8 F (37.1 C) Oral 75 - 98 %  04/30/13 1113 - - - - - 95 %    12:03 PM Reevaluation with update and discussion. After initial assessment and treatment, an updated evaluation reveals he is having a persistent bronchitic sounding cough. Albuterol ordered.Mancel Bale. Andie Mungin L   12:54 PM Reevaluation with update and discussion. After initial assessment and treatment, an updated evaluation reveals he is able to take a deep breath, without coughing. Findings discussed, questions answered, and limitations of evaluation were explained. Renie Stelmach L   Labs Review Labs Reviewed  I-STAT CHEM 8, ED - Abnormal; Notable for the following:    Glucose, Bld 171 (*)    Hemoglobin 11.9 (*)    HCT 35.0 (*)    All other components within normal limits  I-STAT TROPOININ, ED   Imaging Review Dg Chest 2 View  04/30/2013   CLINICAL DATA:  Shortness of breath, history of COPD and Coronary artery extends  EXAM: CHEST  2 VIEW  COMPARISON:  Chest x-ray of 03/26/2013  FINDINGS: No active infiltrate or effusion is seen. Mild peribronchial thickening is noted which may indicate bronchitis. Mediastinal contours appear stable. The heart is within normal limits in size. No bony abnormality is seen.  IMPRESSION: No pneumonia. Mild peribronchial thickening may indicate bronchitis.   Electronically Signed   By: Dwyane DeePaul  Barry M.D.   On: 04/30/2013 11:48     MDM   Final diagnoses:  Bronchitis    Nonspecific signs and symptoms, possibly consistent with bronchitis. Patient is not having chest pain. In emergency department. He had an EKG earlier today from the urgent care, and I briefly reviewed, and it was normal. This does not appear to be an  atypical presentation for ACS. He has had symptoms like this previously. There are no concerning symptoms.  Nursing Notes Reviewed/ Care Coordinated Applicable Imaging Reviewed Interpretation of Laboratory Data incorporated into ED treatment  The patient appears reasonably screened and/or stabilized for discharge and I doubt any other medical condition or other [facility] requiring further screening, evaluation, or treatment in the ED at this time prior to discharge.  Plan: Home Medications- Biaxin, Albuterol, cough med with DM; Home Treatments- rest; return here if the recommended treatment, does not improve the symptoms; Recommended follow up-  pcp 1 week    Flint MelterElliott L Ajahni Nay, MD 04/30/13 1256

## 2013-04-30 NOTE — ED Provider Notes (Addendum)
[name] is a 63 y.o. male who presents to Urgent Care today for cough congestion weakness fatigue and lightheadedness. Patient has had cough congestion and chest tightness for 2 days. He attributes this to bronchitis. Wear today while waiting in the waiting room to be seen he developed significant lightheadedness fatigue sweating and nausea. He felt as though his blood sugar were dropping. He felt a passed out. He denies any new chest pain or shortness of breath. He feels well otherwise. He drank some coke which has not helped.   Patient notes that he had a cardiac catheterization 3 weeks ago.  Past Medical History  Diagnosis Date  . Restless leg   . Cervicalgia   . Loss of weight   . Impotence of organic origin   . Umbilical hernia without mention of obstruction or gangrene   . Barrett's esophagus   . Unspecified gastritis and gastroduodenitis with hemorrhage   . Hyperlipidemia   . Other malaise and fatigue   . Hypoglycemia, unspecified   . GERD (gastroesophageal reflux disease)   . Complication of anesthesia     " i DO NOT WAKE UP VERY WELL "  . PONV (postoperative nausea and vomiting)   . Hypertension   . Heart murmur     "grew out of it" (12/15/2012)  . Chronic bronchitis     "not bad lately; used to get it 3-4 times/yr" (12/15/2012)  . IDDM (insulin dependent diabetes mellitus)   . H/O hiatal hernia   . Tension headache   . Headache     "weekly" (12/15/2012)  . COPD (chronic obstructive pulmonary disease)    History  Substance Use Topics  . Smoking status: Never Smoker   . Smokeless tobacco: Never Used  . Alcohol Use: No   ROS as above Medications: Current Facility-Administered Medications  Medication Dose Route Frequency Provider Last Rate Last Dose  . aspirin chewable tablet 324 mg  324 mg Oral Once Rodolph Bong, MD      . ipratropium-albuterol (DUONEB) 0.5-2.5 (3) MG/3ML nebulizer solution 3 mL  3 mL Nebulization Once Rodolph Bong, MD       Current Outpatient  Prescriptions  Medication Sig Dispense Refill  . acetaminophen (TYLENOL) 500 MG tablet Take 1,000-1,500 mg by mouth every 6 (six) hours as needed for headache.      Marland Kitchen aspirin 81 MG EC tablet Take 1 tablet (81 mg total) by mouth daily.  60 tablet  0  . atorvastatin (LIPITOR) 20 MG tablet Take 20 mg by mouth every morning.       . BD PEN NEEDLE NANO U/F 32G X 4 MM MISC Use one needle daily with Lantus pen  100 each  12  . carvedilol (COREG) 6.25 MG tablet Take 6.25 mg by mouth 2 (two) times daily with a meal.      . Insulin Glargine (LANTUS) 100 UNIT/ML Solostar Pen 20 units in the morning and 20 units in the evening.      . nitroGLYCERIN (NITROSTAT) 0.4 MG SL tablet Place 0.4 mg under the tongue every 5 (five) minutes as needed for chest pain.      . ONE TOUCH ULTRA TEST test strip TEST TWICE A DAY  100 each  1  . pantoprazole (PROTONIX) 40 MG tablet Take 40 mg by mouth 2 (two) times daily.      . pramipexole (MIRAPEX) 0.75 MG tablet Take 0.375-0.75 mg by mouth at bedtime as needed (for restless leg).      Marland Kitchen  prasugrel (EFFIENT) 10 MG TABS tablet Take 1 tablet (10 mg total) by mouth daily.  30 tablet  0    Exam:  BP 115/73  Pulse 68  Temp(Src) 98 F (36.7 C) (Oral)  Resp 20  SpO2 98% Gen: Ill-appearing.  HEENT: EOMI,  MMM Lungs: Normal work of breathing. Coarse breath sounds bilaterally. Worse on right compared to left. Heart: RRR no MRG Abd: NABS, Soft. NT, ND Exts: Brisk capillary refill, warm and well perfused.   Twelve-lead EKG shows normal sinus rhythm at 71 beats per minute. No ST segment elevation or depression. Normal EKG.  Cardiac Cath impression 03/26/13: Impression: Widely patent previously placed stent, coronary arteries have improved significantly from diffuse disease to much improved flow throughout the vessels. Mid circumflex has a 60% stenosis which does not appear to be hemodynamically significant. The anatomy is not significantly changed in this vessel from prior  coronary angiography. Evaluation for noncardiac cause of chest pain is indicated. Patient to discharge home in the morning if he remains stable. A total of 100 cc of contrast was utilized for diagnostic angiography.   Results for orders placed during the hospital encounter of 04/30/13 (from the past 24 hour(s))  GLUCOSE, CAPILLARY     Status: Abnormal   Collection Time    04/30/13 10:18 AM      Result Value Ref Range   Glucose-Capillary 127 (*) 70 - 99 mg/dL   No results found.  Assessment and Plan: 63 y.o. male with significant fatigue and lightheadedness the setting of coronary artery disease. EKG normal. Patient may have pneumonia or acute coronary syndrome. Plan start IV provide aspirin oxygen and IV fluids. Avoid nitroglycerin given patient's relative hypotension and decreased probability of acute coronary syndrome based on normal cath and normal EKG.  Additionally we'll give DuoNeb nebulizer inhaler given history of chronic bronchitis/COPD with coarse breath today.   Discussed warning signs or symptoms. Please see discharge instructions. Patient expresses understanding.    Rodolph BongEvan S Grabiel Schmutz, MD 04/30/13 1042  Rodolph BongEvan S Bruin Bolger, MD 04/30/13 1051

## 2013-04-30 NOTE — ED Notes (Signed)
duoneb started just PTdeparture

## 2013-04-30 NOTE — ED Notes (Signed)
[facility] and SOB x3 days. Pt appears pale and [facility] reported pt had orthostatic BPs. [facility] and received approx 1/2 a duoneb. Pt with cardiac hx with stent placement. Dr. Effie ShyWentz at bedside for assessment. Pt speaking in complete sentences, ambulatory from stretcher to tx room.

## 2013-05-03 ENCOUNTER — Encounter (HOSPITAL_COMMUNITY): Payer: Self-pay | Admitting: Emergency Medicine

## 2013-05-03 ENCOUNTER — Emergency Department (HOSPITAL_COMMUNITY)
Admission: EM | Admit: 2013-05-03 | Discharge: 2013-05-03 | Disposition: A | Discharge status: Home / Self Care | Payer: BC Managed Care – PPO | Attending: Emergency Medicine | Admitting: Emergency Medicine

## 2013-05-03 ENCOUNTER — Encounter (HOSPITAL_COMMUNITY): Payer: BC Managed Care – PPO

## 2013-05-03 DIAGNOSIS — Z87448 Personal history of other diseases of urinary system: Secondary | ICD-10-CM | POA: Insufficient documentation

## 2013-05-03 DIAGNOSIS — J029 Acute pharyngitis, unspecified: Secondary | ICD-10-CM | POA: Insufficient documentation

## 2013-05-03 DIAGNOSIS — R05 Cough: Secondary | ICD-10-CM

## 2013-05-03 DIAGNOSIS — Z794 Long term (current) use of insulin: Secondary | ICD-10-CM | POA: Insufficient documentation

## 2013-05-03 DIAGNOSIS — Z79899 Other long term (current) drug therapy: Secondary | ICD-10-CM | POA: Insufficient documentation

## 2013-05-03 DIAGNOSIS — Z7982 Long term (current) use of aspirin: Secondary | ICD-10-CM | POA: Insufficient documentation

## 2013-05-03 DIAGNOSIS — Z9861 Coronary angioplasty status: Secondary | ICD-10-CM | POA: Insufficient documentation

## 2013-05-03 DIAGNOSIS — Z792 Long term (current) use of antibiotics: Secondary | ICD-10-CM | POA: Insufficient documentation

## 2013-05-03 DIAGNOSIS — E119 Type 2 diabetes mellitus without complications: Secondary | ICD-10-CM | POA: Insufficient documentation

## 2013-05-03 DIAGNOSIS — R011 Cardiac murmur, unspecified: Secondary | ICD-10-CM | POA: Insufficient documentation

## 2013-05-03 DIAGNOSIS — R059 Cough, unspecified: Secondary | ICD-10-CM

## 2013-05-03 DIAGNOSIS — Z7902 Long term (current) use of antithrombotics/antiplatelets: Secondary | ICD-10-CM | POA: Insufficient documentation

## 2013-05-03 DIAGNOSIS — K219 Gastro-esophageal reflux disease without esophagitis: Secondary | ICD-10-CM | POA: Insufficient documentation

## 2013-05-03 DIAGNOSIS — E785 Hyperlipidemia, unspecified: Secondary | ICD-10-CM | POA: Insufficient documentation

## 2013-05-03 DIAGNOSIS — I10 Essential (primary) hypertension: Secondary | ICD-10-CM | POA: Insufficient documentation

## 2013-05-03 DIAGNOSIS — G2581 Restless legs syndrome: Secondary | ICD-10-CM | POA: Insufficient documentation

## 2013-05-03 DIAGNOSIS — J441 Chronic obstructive pulmonary disease with (acute) exacerbation: Secondary | ICD-10-CM | POA: Insufficient documentation

## 2013-05-03 MED ORDER — ALBUTEROL SULFATE HFA 108 (90 BASE) MCG/ACT IN AERS: 2.0000 | INHALATION_SPRAY | Freq: Once | RESPIRATORY_TRACT | Status: DC

## 2013-05-03 MED ORDER — IPRATROPIUM BROMIDE 0.02 % IN SOLN
0.5000 mg | Freq: Once | RESPIRATORY_TRACT | Status: AC
  Administered 2013-05-03: 0.5 mg via RESPIRATORY_TRACT
  Filled 2013-05-03: qty 2.5

## 2013-05-03 MED ORDER — ALBUTEROL SULFATE (2.5 MG/3ML) 0.083% IN NEBU
5.0000 mg | INHALATION_SOLUTION | Freq: Once | RESPIRATORY_TRACT | Status: AC
  Administered 2013-05-03: 5 mg via RESPIRATORY_TRACT
  Filled 2013-05-03: qty 6

## 2013-05-03 NOTE — ED Provider Notes (Signed)
CSN: [id number]     Arrival date & time 05/03/13  [id number] History   First MD Initiated Contact with Patient 05/03/13 0534     Chief Complaint  Patient presents with  . Cough      Patient is a 63 y.o. male presenting with cough. The history is provided by the patient.  Cough Cough characteristics:  Non-productive Severity:  Moderate Onset quality:  Gradual Duration:  2 days Timing:  Intermittent Progression:  Worsening Chronicity:  New Smoker: no   Relieved by:  Nothing Worsened by:  Nothing tried Associated symptoms: shortness of breath and sore throat   Associated symptoms: no chest pain   pt reports he developed cough 2 days ago.  He was seen at that time, started on biaxin and albuterol inhaler but he reports it is not improving.  He reports continued dry cough and SOB.  He also reports sore throat.  No CP.  No LE edema.  He reports fatigue.    Past Medical History  Diagnosis Date  . Restless leg   . Cervicalgia   . Loss of weight   . Impotence of organic origin   . Umbilical hernia without mention of obstruction or gangrene   . Barrett's esophagus   . Unspecified gastritis and gastroduodenitis with hemorrhage   . Hyperlipidemia   . Other malaise and fatigue   . Hypoglycemia, unspecified   . GERD (gastroesophageal reflux disease)   . Complication of anesthesia     " i DO NOT WAKE UP VERY WELL "  . PONV (postoperative nausea and vomiting)   . Hypertension   . Heart murmur     "grew out of it" (12/15/2012)  . Chronic bronchitis     "not bad lately; used to get it 3-4 times/yr" (12/15/2012)  . IDDM (insulin dependent diabetes mellitus)   . H/O hiatal hernia   . Tension headache   . Headache     "weekly" (12/15/2012)  . COPD (chronic obstructive pulmonary disease)    Past Surgical History  Procedure Laterality Date  . Knee surgery Right 1992 and 2000    "calcium deposits removed" (12/15/2012)  . Coronary angioplasty with stent placement  12/15/2012    "2 stents"  (12/15/2012)  . Hernia repair  2010    "umbilical" (12/15/2012)  . Incision and drainage abscess Left 05/2007    "groin" (12/15/2012)   History reviewed. No pertinent family history. History  Substance Use Topics  . Smoking status: Never Smoker   . Smokeless tobacco: Never Used  . Alcohol Use: No    Review of Systems  HENT: Positive for sore throat.   Respiratory: Positive for cough and shortness of breath.   Cardiovascular: Negative for chest pain.  All other systems reviewed and are negative.      Allergies  Review of patient's allergies indicates no known allergies.  Home Medications   Current Outpatient Rx  Name  Route  Sig  Dispense  Refill  . acetaminophen (TYLENOL) 500 MG tablet   Oral   Take 1,000-1,500 mg by mouth every 6 (six) hours as needed for headache.         Marland Kitchen. aspirin 81 MG EC tablet   Oral   Take 1 tablet (81 mg total) by mouth daily.   60 tablet   0   . atorvastatin (LIPITOR) 20 MG tablet   Oral   Take 20 mg by mouth every morning.          . carvedilol (COREG)  6.25 MG tablet   Oral   Take 6.25 mg by mouth 2 (two) times daily with a meal.         . clarithromycin (BIAXIN) 500 MG tablet   Oral   Take 1 tablet (500 mg total) by mouth 2 (two) times daily.   20 tablet   0   . cyclobenzaprine (FLEXERIL) 5 MG tablet   Oral   Take 5 mg by mouth at bedtime.          Marland Kitchen guaiFENesin (ROBITUSSIN) 100 MG/5ML liquid   Oral   Take 300 mg by mouth 3 (three) times daily as needed for cough.         . insulin glargine (LANTUS) 100 UNIT/ML injection   Subcutaneous   Inject 20-24 Units into the skin See admin instructions. Take 20 units in the morning and 24 units in the evening         . pantoprazole (PROTONIX) 40 MG tablet   Oral   Take 40 mg by mouth 2 (two) times daily.         . pramipexole (MIRAPEX) 0.75 MG tablet   Oral   Take 0.375-0.75 mg by mouth at bedtime as needed (for restless leg).         . prasugrel (EFFIENT) 10  MG TABS tablet   Oral   Take 1 tablet (10 mg total) by mouth daily.   30 tablet   0   . BD PEN NEEDLE NANO U/F 32G X 4 MM MISC      Use one needle daily with Lantus pen   100 each   12     Dispense as written.   . nitroGLYCERIN (NITROSTAT) 0.4 MG SL tablet   Sublingual   Place 0.4 mg under the tongue every 5 (five) minutes as needed for chest pain.         . ONE TOUCH ULTRA TEST test strip      TEST TWICE A DAY   100 each   1    BP 109/78  Pulse 59  Temp(Src) 97.7 F (36.5 C) (Oral)  Resp 24  Ht 5\' 8"  (1.727 m)  Wt 183 lb (83.008 kg)  BMI 27.83 kg/m2  SpO2 97% Physical Exam CONSTITUTIONAL: Well developed/well nourished HEAD: Normocephalic/atraumatic EYES: EOMI/PERRL ENMT: Mucous membranes moist, uvula midline, no erythema or exudates noted NECK: supple no meningeal signs SPINE:entire spine nontender CV: S1/S2 noted, no murmurs/rubs/gallops noted LUNGS: Lungs are clear to auscultation bilaterally, no apparent distress ABDOMEN: soft, nontender, no rebound or guarding GU:no cva tenderness NEURO: Pt is awake/alert, moves all extremitiesx4 EXTREMITIES: pulses normal, full ROM, no LE edema noted SKIN: warm, color normal PSYCH: no abnormalities of mood noted   ED Course  Procedures   7:13 AM Pt well appearing at this time Seen on 2/27 for cough, started on biaxin and reports using an albuterol inhaler but no improvement He was given nebs here and improved He ambulated and no hypoxia/tachypnea Pt is in no distress I doubt ACS/PE/CHF at this time I don't feel repeat imaging is needed at this time Pt agreeable with plan and feels improved for d/c home  (nursing note from 0511 is for another patient)  EKG Interpretation   Date/Time:  Monday May 03 2013 06:49:39 EST Ventricular Rate:  55 PR Interval:  133 QRS Duration: 103 QT Interval:  481 QTC Calculation: 460 R Axis:   -3 Text Interpretation:  Sinus rhythm Low voltage, precordial leads Confirmed   by Bebe Shaggy  MD, Dorinda Hill ([id number]) on 05/03/2013 6:53:48 AM      MDM   Final diagnoses:  Cough    Nursing notes including past medical history and social history reviewed and considered in documentation Previous records reviewed and considered     Joya Gaskins, MD 05/03/13 [phone]

## 2013-05-03 NOTE — ED Notes (Signed)
Dry cough since 2/26 worsening daily.  Saw PMD, was given Biaxin which does not appear to be working per pt.  Hx of bronchitis 2-3 times yearly and reports biaxin generally works if he catches it early enough.

## 2013-05-03 NOTE — Discharge Instructions (Signed)
Cough, Adult  A cough is a reflex. It helps you clear your throat and airways. A cough can help heal your body. A cough can last 2 or 3 weeks (acute) or may last more than 8 weeks (chronic). Some common causes of a cough can include an infection, allergy, or a cold. HOME CARE  Only take medicine as told by your doctor.  If given, take your medicines (antibiotics) as told. Finish them even if you start to feel better.  Use a cold steam vaporizer or humidier in your home. This can help loosen thick spit (secretions).  Sleep so you are almost sitting up (semi-upright). Use pillows to do this. This helps reduce coughing.  Rest as needed.  Stop smoking if you smoke. GET HELP RIGHT AWAY IF:  You have yellowish-white fluid (pus) in your thick spit.  Your cough gets worse.  Your medicine does not reduce coughing, and you are losing sleep.  You cough up blood.  You have trouble breathing.  Your pain gets worse and medicine does not help.  You have a fever. MAKE SURE YOU:   Understand these instructions.  Will watch your condition.  Will get help right away if you are not doing well or get worse. Document Released: 11/01/2010 Document Revised: 05/13/2011 Document Reviewed: 11/01/2010 [employer] Patient Information 2014 [employer]  

## 2013-05-03 NOTE — ED Notes (Signed)
Pt reports cough, since Friday, seen md and given abx and reports no relief in symptoms.

## 2013-05-03 NOTE — ED Notes (Signed)
Pt breath sounds improved s/p respiratory treatment.  No wheezing heard.  Crackles noted right lower lung.  Pt coughing less frequently.  Resting, awaiting disposition.

## 2013-05-03 NOTE — ED Notes (Signed)
Pt to be admitted to ICU.  Dr Kirtland BouchardK here examining pt.  CT ordered prior to going to floor.  Dr Kirtland BouchardK wants swallow eval completed; have been unable to remove him from bi-pap to do this.

## 2013-05-03 NOTE — ED Notes (Signed)
Pt alert, NAD, calm, interactive, resps e/u, speaking in clear complete sentences, VSS, (denies: pain, sob, nausea or dizziness).  

## 2013-05-05 ENCOUNTER — Encounter (HOSPITAL_COMMUNITY): Payer: BC Managed Care – PPO

## 2013-05-07 ENCOUNTER — Encounter (HOSPITAL_COMMUNITY): Payer: BC Managed Care – PPO

## 2013-05-10 ENCOUNTER — Encounter (HOSPITAL_COMMUNITY): Payer: BC Managed Care – PPO

## 2013-05-12 ENCOUNTER — Encounter (HOSPITAL_COMMUNITY): Payer: BC Managed Care – PPO

## 2013-05-14 ENCOUNTER — Encounter (HOSPITAL_COMMUNITY): Payer: BC Managed Care – PPO

## 2013-05-17 ENCOUNTER — Encounter (HOSPITAL_COMMUNITY)
Admission: RE | Admit: 2013-05-17 | Discharge: 2013-05-17 | Disposition: A | Discharge status: Home / Self Care | Payer: BC Managed Care – PPO | Source: Ambulatory Visit | Attending: Cardiology | Admitting: Cardiology

## 2013-05-17 DIAGNOSIS — Z7902 Long term (current) use of antithrombotics/antiplatelets: Secondary | ICD-10-CM | POA: Insufficient documentation

## 2013-05-17 DIAGNOSIS — I2510 Atherosclerotic heart disease of native coronary artery without angina pectoris: Secondary | ICD-10-CM | POA: Insufficient documentation

## 2013-05-17 DIAGNOSIS — Z9861 Coronary angioplasty status: Secondary | ICD-10-CM | POA: Insufficient documentation

## 2013-05-17 NOTE — Progress Notes (Signed)
Pt returned to rehab after absence due to left shoulder which was complicated with a case of bronchitis.  Pt able to exercise with no arm use on the left arm.Alanson Alyarlette Stefanie Hodgens RN, BSN

## 2013-05-19 ENCOUNTER — Encounter: Payer: Self-pay | Admitting: Internal Medicine

## 2013-05-19 ENCOUNTER — Ambulatory Visit (INDEPENDENT_AMBULATORY_CARE_PROVIDER_SITE_OTHER): Payer: BC Managed Care – PPO | Admitting: Internal Medicine

## 2013-05-19 VITALS — BP 122/82 | HR 72 | Temp 97.9°F | Resp 18 | Ht 68.0 in | Wt 183.0 lb

## 2013-05-19 DIAGNOSIS — J411 Mucopurulent chronic bronchitis: Secondary | ICD-10-CM

## 2013-05-19 DIAGNOSIS — R634 Abnormal weight loss: Secondary | ICD-10-CM

## 2013-05-19 DIAGNOSIS — E1159 Type 2 diabetes mellitus with other circulatory complications: Secondary | ICD-10-CM

## 2013-05-19 DIAGNOSIS — R05 Cough: Secondary | ICD-10-CM

## 2013-05-19 DIAGNOSIS — E785 Hyperlipidemia, unspecified: Secondary | ICD-10-CM

## 2013-05-19 DIAGNOSIS — M25519 Pain in unspecified shoulder: Secondary | ICD-10-CM

## 2013-05-19 DIAGNOSIS — R059 Cough, unspecified: Secondary | ICD-10-CM

## 2013-05-19 DIAGNOSIS — M542 Cervicalgia: Secondary | ICD-10-CM

## 2013-05-19 MED ORDER — INSULIN GLARGINE 100 UNIT/ML ~~LOC~~ SOLN: SUBCUTANEOUS | Status: DC

## 2013-05-19 MED ORDER — GLYBURIDE 2.5 MG PO TABS: 2.5000 mg | ORAL_TABLET | Freq: Every day | ORAL | Status: DC

## 2013-05-19 NOTE — Patient Instructions (Signed)
Continue current medications. 

## 2013-05-19 NOTE — Progress Notes (Signed)
Patient ID: [name], male   DOB: 02/03/1951, 63 y.o.   MRN: [id number]    Location:    [facility]  Place of Service:  OFFICE   No Known Allergies  Chief Complaint  Patient presents with  . Follow-up    HPI:  Type II or unspecified type diabetes mellitus with peripheral circulatory disorders, uncontrolled(250.72) - running high. Has been sick over the last 2 months. Was hospitalized with chest pain in Jan 2015, then had bronchitis in late Feb 2015 and is just now beginning to feel better from that.  Cough: improved  Cervicalgia: persistent  Pain in joint, shoulder region: fell on has left arm and began having worse pain in the  Left shoulder. Has been seen by Dr, Noemi Chapel in the past and thinks he should see him again.  Mucopurulent chronic bronchitis: resolving, but still hoarse. No fever or sputum.  Loss of weight: during the time he has been ill. Lost his appetite.  Hyperlipidemia - needs recheck    Medications: Patient's Medications  New Prescriptions   No medications on file  Previous Medications   ACETAMINOPHEN (TYLENOL) 500 MG TABLET    Take 1,000-1,500 mg by mouth every 6 (six) hours as needed for headache.   ASPIRIN 81 MG EC TABLET    Take 1 tablet (81 mg total) by mouth daily.   ATORVASTATIN (LIPITOR) 20 MG TABLET    Take 20 mg by mouth every morning.    BD PEN NEEDLE NANO U/F 32G X 4 MM MISC    Use one needle daily with Lantus pen   CARVEDILOL (COREG) 6.25 MG TABLET    Take 6.25 mg by mouth 2 (two) times daily with a meal.   CYCLOBENZAPRINE (FLEXERIL) 5 MG TABLET    Take 5 mg by mouth at bedtime.    GUAIFENESIN (ROBITUSSIN) 100 MG/5ML LIQUID    Take 300 mg by mouth 3 (three) times daily as needed for cough.   INSULIN GLARGINE (LANTUS) 100 UNIT/ML INJECTION    Inject 20-24 Units into the skin See admin instructions. Take 20 units in the morning and 24 units in the evening   NITROGLYCERIN (NITROSTAT) 0.4 MG SL TABLET    Place 0.4 mg under the tongue every 5 (five)  minutes as needed for chest pain.   ONE TOUCH ULTRA TEST TEST STRIP    TEST TWICE A DAY   PANTOPRAZOLE (PROTONIX) 40 MG TABLET    Take 40 mg by mouth 2 (two) times daily.   PRAMIPEXOLE (MIRAPEX) 0.75 MG TABLET    Take 0.375-0.75 mg by mouth at bedtime as needed (for restless leg).   PRASUGREL (EFFIENT) 10 MG TABS TABLET    Take 1 tablet (10 mg total) by mouth daily.  Modified Medications   No medications on file  Discontinued Medications   CLARITHROMYCIN (BIAXIN) 500 MG TABLET    Take 1 tablet (500 mg total) by mouth 2 (two) times daily.     Review of Systems  Constitutional: Positive for fatigue. Negative for fever.  HENT: Positive for hearing loss. Negative for ear pain.   Eyes: Negative.        Wears prescription lenses.  Respiratory: Negative for cough and shortness of breath.   Cardiovascular: Negative for chest pain, palpitations and leg swelling.  Endocrine: Negative for cold intolerance, heat intolerance, polydipsia, polyphagia and polyuria.       Diabetes under poor control.  Genitourinary: Negative.   Musculoskeletal: Positive for arthralgias, back pain, gait problem, joint swelling  and myalgias. Negative for neck pain and neck stiffness.  Skin: Negative.   Allergic/Immunologic: Negative.   Neurological: Positive for weakness and headaches. Negative for dizziness, tremors, seizures, syncope, facial asymmetry, speech difficulty, light-headedness and numbness.  Hematological: Negative.   Psychiatric/Behavioral: The patient is nervous/anxious.     Filed Vitals:   05/19/13 1249  BP: 122/82  Pulse: 72  Temp: 97.9 F (36.6 C)  TempSrc: Oral  Resp: 18  Height: _0  (1.727 m)  Weight: 183 lb (83.008 kg)  SpO2: 98%   Physical Exam  Constitutional: He is oriented to person, place, and time. He appears well-developed and well-nourished. No distress.  Significantly overweight.  HENT:  Head: Normocephalic and atraumatic.  Right Ear: External ear normal.  Left Ear:  External ear normal.  Nose: Nose normal.  Mouth/Throat: Oropharynx is clear and moist.  Eyes: Conjunctivae and EOM are normal. Pupils are equal, round, and reactive to light.  Neck: Normal range of motion. Neck supple. No JVD present. No tracheal deviation present. No thyromegaly present.  Hoarse.   Cardiovascular: Normal rate, regular rhythm, normal heart sounds and intact distal pulses.  Exam reveals no gallop and no friction rub.   No murmur heard. Pulmonary/Chest: Effort normal. No respiratory distress. He has no wheezes. He has no rales. He exhibits no tenderness.  Abdominal: Bowel sounds are normal. He exhibits no distension and no mass.  Musculoskeletal: He exhibits no edema and no tenderness.  Bilateral shoulder discomfort with some restriction of movement especially at the left shoulder. Painful to raise left arm. Painful to lift items and to rotate at left shoulder. Bartholome Bill been seen by Dr. Noemi Chapel. Chronic neck pains.  Lymphadenopathy:    He has no cervical adenopathy.  Neurological: He is alert and oriented to person, place, and time. He has normal reflexes. No cranial nerve deficit. Coordination normal.  Skin: Skin is warm and dry. No rash noted. No erythema. No pallor.  Psychiatric: He has a normal mood and affect. His behavior is normal. Judgment and thought content normal.     Labs reviewed: Admission on 04/30/2013, Discharged on 04/30/2013  Component Date Value Ref Range Status  . Sodium 04/30/2013 139  137 - 147 mEq/L Final  . Potassium 04/30/2013 3.9  3.7 - 5.3 mEq/L Final  . Chloride 04/30/2013 101  96 - 112 mEq/L Final  . BUN 04/30/2013 15  6 - 23 mg/dL Final  . Creatinine, Ser 04/30/2013 1.10  0.50 - 1.35 mg/dL Final  . Glucose, Bld 04/30/2013 171* 70 - 99 mg/dL Final  . Calcium, Ion 04/30/2013 1.14  1.13 - 1.30 mmol/L Final  . TCO2 04/30/2013 24  0 - 100 mmol/L Final  . Hemoglobin 04/30/2013 11.9* 13.0 - 17.0 g/dL Final  . HCT 04/30/2013 35.0* 39.0 - 52.0 % Final    . Troponin i, poc 04/30/2013 0.00  0.00 - 0.08 ng/mL Final  . Comment 3 04/30/2013          Final   Comment: Due to the release kinetics of cTnI,                          a negative result within the first hours                          of the onset of symptoms does not rule out  myocardial infarction with certainty.                          If myocardial infarction is still suspected,                          repeat the test at appropriate intervals.  Admission on 04/30/2013, Discharged on 04/30/2013  Component Date Value Ref Range Status  . Glucose-Capillary 04/30/2013 127* 70 - 99 mg/dL Final  Admission on 03/26/2013, Discharged on 03/27/2013  Component Date Value Ref Range Status  . WBC 03/26/2013 5.4  4.0 - 10.5 K/uL Final  . RBC 03/26/2013 4.61  4.22 - 5.81 MIL/uL Final  . Hemoglobin 03/26/2013 14.3  13.0 - 17.0 g/dL Final  . HCT 03/26/2013 39.5  39.0 - 52.0 % Final  . MCV 03/26/2013 85.7  78.0 - 100.0 fL Final  . MCH 03/26/2013 31.0  26.0 - 34.0 pg Final  . MCHC 03/26/2013 36.2* 30.0 - 36.0 g/dL Final  . RDW 03/26/2013 12.3  11.5 - 15.5 % Final  . Platelets 03/26/2013 118* 150 - 400 K/uL Final   PLATELET COUNT CONFIRMED BY SMEAR  . Sodium 03/26/2013 141  137 - 147 mEq/L Final  . Potassium 03/26/2013 4.1  3.7 - 5.3 mEq/L Final  . Chloride 03/26/2013 105  96 - 112 mEq/L Final  . CO2 03/26/2013 25  19 - 32 mEq/L Final  . Glucose, Bld 03/26/2013 120* 70 - 99 mg/dL Final  . BUN 03/26/2013 19  6 - 23 mg/dL Final  . Creatinine, Ser 03/26/2013 1.06  0.50 - 1.35 mg/dL Final  . Calcium 03/26/2013 9.0  8.4 - 10.5 mg/dL Final  . GFR calc non Af Amer 03/26/2013 73* >90 mL/min Final  . GFR calc Af Amer 03/26/2013 84* >90 mL/min Final   Comment: (NOTE)                          The eGFR has been calculated using the CKD EPI equation.                          This calculation has not been validated in all clinical situations.                          eGFR's  persistently <90 mL/min signify possible Chronic Kidney                          Disease.  . Troponin i, poc 03/26/2013 0.01  0.00 - 0.08 ng/mL Final  . Comment 3 03/26/2013          Final   Comment: Due to the release kinetics of cTnI,                          a negative result within the first hours                          of the onset of symptoms does not rule out                          myocardial infarction with certainty.  If myocardial infarction is still suspected,                          repeat the test at appropriate intervals.  Marland Kitchen MRSA by PCR 03/26/2013 NEGATIVE  NEGATIVE Final   Comment:                                 The GeneXpert MRSA Assay (FDA                          approved for NASAL specimens                          only), is one component of a                          comprehensive MRSA colonization                          surveillance program. It is not                          intended to diagnose MRSA                          infection nor to guide or                          monitor treatment for                          MRSA infections.  . WBC 03/27/2013 4.5  4.0 - 10.5 K/uL Final  . RBC 03/27/2013 4.21* 4.22 - 5.81 MIL/uL Final  . Hemoglobin 03/27/2013 12.8* 13.0 - 17.0 g/dL Final  . HCT 03/27/2013 36.5* 39.0 - 52.0 % Final  . MCV 03/27/2013 86.7  78.0 - 100.0 fL Final  . MCH 03/27/2013 30.4  26.0 - 34.0 pg Final  . MCHC 03/27/2013 35.1  30.0 - 36.0 g/dL Final  . RDW 03/27/2013 12.7  11.5 - 15.5 % Final  . Platelets 03/27/2013 108* 150 - 400 K/uL Final   CONSISTENT WITH PREVIOUS RESULT  . Troponin I 03/27/2013 <0.30  <0.30 ng/mL Final   Comment:                                 Due to the release kinetics of cTnI,                          a negative result within the first hours                          of the onset of symptoms does not rule out                          myocardial infarction with certainty.                           If myocardial infarction is still suspected,  repeat the test at appropriate intervals.  . Glucose-Capillary 03/26/2013 69* 70 - 99 mg/dL Final  . Comment 1 03/26/2013 Notify RN   Final  . Activated Clotting Time 03/26/2013 271   Final  . Glucose-Capillary 03/26/2013 137* 70 - 99 mg/dL Final  . Comment 1 03/26/2013 Documented in Chart   Final  . Comment 2 03/26/2013 ROSELLE RN   Final  . Glucose-Capillary 03/27/2013 84  70 - 99 mg/dL Final  . Comment 1 03/27/2013 Notify RN   Final  . Glucose-Capillary 03/26/2013 73  70 - 99 mg/dL Final  Appointment on 03/08/2013  Component Date Value Ref Range Status  . Glucose 03/08/2013 150* 65 - 99 mg/dL Final  . BUN 03/08/2013 16  8 - 27 mg/dL Final  . Creatinine, Ser 03/08/2013 1.23  0.76 - 1.27 mg/dL Final  . GFR calc non Af Amer 03/08/2013 62  >59 mL/min/1.73 Final  . GFR calc Af Amer 03/08/2013 72  >59 mL/min/1.73 Final  . BUN/Creatinine Ratio 03/08/2013 13  10 - 22 Final  . Sodium 03/08/2013 141  134 - 144 mmol/L Final  . Potassium 03/08/2013 4.5  3.5 - 5.2 mmol/L Final  . Chloride 03/08/2013 100  97 - 108 mmol/L Final  . CO2 03/08/2013 24  18 - 29 mmol/L Final  . Calcium 03/08/2013 9.2  8.6 - 10.2 mg/dL Final   Comment: **Effective March 22, 2013 the reference interval**                            for Calcium, Serum will be changing to:                                       Age                Male          Male                                    0 - 10 days        8.6 - 10.4     8.6 - 10.4                              11 days -  1 year        9.2 - 11.0     9.2 - 11.0                                    2 - 11 years       9.1 - 10.5     9.1 - 10.5                                   12 - 17 years       8.9 - 10.4     8.9 - 10.4                                   18 - 59 years  8.7 - 10.2     8.7 - 10.2                                       >59 years       8.6 - 10.2     8.7 - 10.3  . Total Protein  03/08/2013 6.6  6.0 - 8.5 g/dL Final  . Albumin 03/08/2013 4.4  3.6 - 4.8 g/dL Final  . Globulin, Total 03/08/2013 2.2  1.5 - 4.5 g/dL Final  . Albumin/Globulin Ratio 03/08/2013 2.0  1.1 - 2.5 Final  . Total Bilirubin 03/08/2013 0.7  0.0 - 1.2 mg/dL Final  . Alkaline Phosphatase 03/08/2013 62  39 - 117 IU/L Final  . AST 03/08/2013 17  0 - 40 IU/L Final  . ALT 03/08/2013 22  0 - 44 IU/L Final  . Hemoglobin A1C 03/08/2013 8.5* 4.8 - 5.6 % Final   Comment:          Increased risk for diabetes: 5.7 - 6.4                                   Diabetes: >6.4                                   Glycemic control for adults with diabetes: <7.0  . Estimated average glucose 03/08/2013 197   Final      Assessment/Plan  1. Type II or unspecified type diabetes mellitus with peripheral circulatory disorders, uncontrolled(250.72) Added glyburide - glyBURIDE (DIABETA) 2.5 MG tablet; Take 1 tablet (2.5 mg total) by mouth daily with breakfast.  Dispense: 30 tablet; Refill: 3 - insulin glargine (LANTUS) 100 UNIT/ML injection; Take 20 units in the morning and 24 units in the evening  Dispense: 10 mL; Refill: 5 - Hemoglobin A1c; Future - Comprehensive metabolic panel; Future - Microalbumin / creatinine urine ratio; Future  2. Cough improved  3. Cervicalgia unchanged  4. Pain in joint, shoulder region See Dr. Noemi Chapel  5. Mucopurulent chronic bronchitis Resolving. Does not need more antibiotic at this time  6. Loss of weight observe  7. Hyperlipidemia - Lipid panel; Future

## 2013-05-24 ENCOUNTER — Encounter (HOSPITAL_COMMUNITY)
Admission: RE | Admit: 2013-05-24 | Discharge: 2013-05-24 | Disposition: A | Discharge status: Home / Self Care | Payer: BC Managed Care – PPO | Source: Ambulatory Visit | Attending: Cardiology | Admitting: Cardiology

## 2013-05-26 ENCOUNTER — Encounter (HOSPITAL_COMMUNITY)
Admission: RE | Admit: 2013-05-26 | Discharge: 2013-05-26 | Disposition: A | Discharge status: Home / Self Care | Payer: BC Managed Care – PPO | Source: Ambulatory Visit | Attending: Cardiology | Admitting: Cardiology

## 2013-05-28 ENCOUNTER — Encounter (HOSPITAL_COMMUNITY): Payer: BC Managed Care – PPO

## 2013-05-31 ENCOUNTER — Encounter (HOSPITAL_COMMUNITY)
Admission: RE | Admit: 2013-05-31 | Discharge: 2013-05-31 | Disposition: A | Discharge status: Home / Self Care | Payer: BC Managed Care – PPO | Source: Ambulatory Visit | Attending: Cardiology | Admitting: Cardiology

## 2013-06-02 ENCOUNTER — Encounter (HOSPITAL_COMMUNITY)
Admission: RE | Admit: 2013-06-02 | Discharge: 2013-06-02 | Disposition: A | Discharge status: Home / Self Care | Payer: BC Managed Care – PPO | Source: Ambulatory Visit | Attending: Cardiology | Admitting: Cardiology

## 2013-06-02 DIAGNOSIS — Z9861 Coronary angioplasty status: Secondary | ICD-10-CM | POA: Insufficient documentation

## 2013-06-02 DIAGNOSIS — I2510 Atherosclerotic heart disease of native coronary artery without angina pectoris: Secondary | ICD-10-CM | POA: Insufficient documentation

## 2013-06-02 DIAGNOSIS — Z7902 Long term (current) use of antithrombotics/antiplatelets: Secondary | ICD-10-CM | POA: Insufficient documentation

## 2013-06-04 ENCOUNTER — Encounter (HOSPITAL_COMMUNITY)
Admission: RE | Admit: 2013-06-04 | Discharge: 2013-06-04 | Disposition: A | Discharge status: Home / Self Care | Payer: BC Managed Care – PPO | Source: Ambulatory Visit | Attending: Cardiology | Admitting: Cardiology

## 2013-06-04 NOTE — Progress Notes (Signed)
Pt will complete cardiac rehab phase II on April 10 which is pt's 18 weeks from the start of cardiac rehab,  Pt with some brief and extended absences due to illness and muscloskeletal discomfort. 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  Pt has appt with Dr.  Para March on this coming Tuesday for evaluation and treatment.  Pt would benefit from continued supervision regarding diabetes management to continue to monitor A1C results.  Pt has made significant improvement but his A1C remains out of range.  Pt plans to continue his home exercise with walking as tolerated.  Medication list reconciled. Pt has met his short term goal to increase stamina.  Pt is working toward achieving his long term goal and with improvement of his discomfort and strictly adhering to a diabetic diet this is an achievable goal.   Repeat PHQ2 score 0.  Pt is pleased with his progress.  Medication list reconciled. Cherre Huger, BSN

## 2013-06-05 ENCOUNTER — Other Ambulatory Visit: Payer: Self-pay | Admitting: Internal Medicine

## 2013-06-07 ENCOUNTER — Encounter (HOSPITAL_COMMUNITY)
Admission: RE | Admit: 2013-06-07 | Discharge: 2013-06-07 | Disposition: A | Discharge status: Home / Self Care | Payer: BC Managed Care – PPO | Source: Ambulatory Visit | Attending: Cardiology | Admitting: Cardiology

## 2013-06-07 LAB — GLUCOSE, CAPILLARY: Glucose-Capillary: 153 mg/dL — ABNORMAL HIGH (ref 70–99)

## 2013-06-09 ENCOUNTER — Encounter (HOSPITAL_COMMUNITY)
Admission: RE | Admit: 2013-06-09 | Discharge: 2013-06-09 | Disposition: A | Discharge status: Home / Self Care | Payer: BC Managed Care – PPO | Source: Ambulatory Visit | Attending: Cardiology | Admitting: Cardiology

## 2013-06-09 ENCOUNTER — Ambulatory Visit: Payer: Self-pay | Admitting: Internal Medicine

## 2013-06-11 ENCOUNTER — Encounter (HOSPITAL_COMMUNITY)
Admission: RE | Admit: 2013-06-11 | Discharge: 2013-06-11 | Disposition: A | Discharge status: Home / Self Care | Payer: BC Managed Care – PPO | Source: Ambulatory Visit | Attending: Cardiology | Admitting: Cardiology

## 2013-06-16 ENCOUNTER — Encounter: Payer: Self-pay | Admitting: Internal Medicine

## 2013-06-16 ENCOUNTER — Ambulatory Visit (INDEPENDENT_AMBULATORY_CARE_PROVIDER_SITE_OTHER): Payer: BC Managed Care – PPO | Admitting: Internal Medicine

## 2013-06-16 VITALS — BP 122/64 | HR 77 | Temp 98.4°F | Resp 18 | Ht 68.0 in | Wt 185.4 lb

## 2013-06-16 DIAGNOSIS — R059 Cough, unspecified: Secondary | ICD-10-CM

## 2013-06-16 DIAGNOSIS — M6789 Other specified disorders of synovium and tendon, multiple sites: Secondary | ICD-10-CM

## 2013-06-16 DIAGNOSIS — M7660 Achilles tendinitis, unspecified leg: Secondary | ICD-10-CM | POA: Insufficient documentation

## 2013-06-16 DIAGNOSIS — R05 Cough: Secondary | ICD-10-CM

## 2013-06-16 MED ORDER — PSEUDOEPHEDRINE-GUAIFENESIN ER 60-600 MG PO TB12: ORAL_TABLET | ORAL | Status: DC

## 2013-06-16 MED ORDER — AZITHROMYCIN 250 MG PO TABS: ORAL_TABLET | ORAL | Status: DC

## 2013-06-16 MED ORDER — DEXTROMETHORPHAN POLISTIREX 30 MG/5ML PO LQCR: ORAL | Status: DC

## 2013-06-16 NOTE — Patient Instructions (Signed)
Review medications. Use new ones as indicated. Aspercream or Myoflex will probably help the heel discomfort. Apply 4 times daily.

## 2013-06-16 NOTE — Progress Notes (Signed)
Patient ID: [name], male   DOB: [birth year], 63 y.o.   MRN: [id number]    Location:  [facility]   Place of Service: OFFICE    No Known Allergies  Chief Complaint  Patient presents with  . Acute Visit    coughing, chest tightness since Sun , left heel some pain.    HPI:  Recurrent respiratory symptoms. Started 06/11/13. Sore throat initially. Glands were swollen. Developed productive cough with yellow color.Also with sinus congestion. Taken OTC cough syrup and Mucinex. No history of seasonal allergy.  Pain in the left Achilles insertion. Started after activity to work out increase. No calf pain.  Medications: Patient's Medications  New Prescriptions   No medications on file  Previous Medications   ACETAMINOPHEN (TYLENOL) 500 MG TABLET    Take 1,000-1,500 mg by mouth every 6 (six) hours as needed for headache.   ASPIRIN 81 MG EC TABLET    Take 1 tablet (81 mg total) by mouth daily.   ATORVASTATIN (LIPITOR) 20 MG TABLET    Take 20 mg by mouth every morning.    BD PEN NEEDLE NANO U/F 32G X 4 MM MISC    Use one needle daily with Lantus pen   CARVEDILOL (COREG) 6.25 MG TABLET    Take 6.25 mg by mouth 2 (two) times daily with a meal.   CYCLOBENZAPRINE (FLEXERIL) 5 MG TABLET    Take 5 mg by mouth at bedtime.    GLYBURIDE (DIABETA) 2.5 MG TABLET    Take 1 tablet (2.5 mg total) by mouth daily with breakfast.   GUAIFENESIN (ROBITUSSIN) 100 MG/5ML LIQUID    Take 300 mg by mouth 3 (three) times daily as needed for cough.   INSULIN GLARGINE (LANTUS) 100 UNIT/ML INJECTION    Take 20 units in the morning and 24 units in the evening   NITROGLYCERIN (NITROSTAT) 0.4 MG SL TABLET    Place 0.4 mg under the tongue every 5 (five) minutes as needed for chest pain.   ONE TOUCH ULTRA TEST TEST STRIP    TEST TWICE A DAY   PANTOPRAZOLE (PROTONIX) 40 MG TABLET    TAKE 1 TABLET (40 MG TOTAL) BY MOUTH 2 (TWO) TIMES DAILY.   PRAMIPEXOLE (MIRAPEX) 0.75 MG TABLET    Take 0.375-0.75 mg by mouth at bedtime as needed  (for restless leg).   PRASUGREL (EFFIENT) 10 MG TABS TABLET    Take 1 tablet (10 mg total) by mouth daily.  Modified Medications   No medications on file  Discontinued Medications   PANTOPRAZOLE (PROTONIX) 40 MG TABLET    Take 40 mg by mouth 2 (two) times daily.     Review of Systems  Constitutional: Positive for fatigue. Negative for fever.  HENT: Positive for congestion and hearing loss. Negative for ear pain.   Eyes: Negative.        Wears prescription lenses.  Respiratory: Negative for shortness of breath.   Cardiovascular: Negative for chest pain, palpitations and leg swelling.  Endocrine: Negative for cold intolerance, heat intolerance, polydipsia, polyphagia and polyuria.       Diabetes under poor control.  Genitourinary: Negative.   Musculoskeletal: Positive for arthralgias, back pain, gait problem, joint swelling and myalgias. Negative for neck pain and neck stiffness.  Skin: Negative.   Allergic/Immunologic: Negative.   Neurological: Positive for weakness and headaches. Negative for dizziness, tremors, seizures, syncope, facial asymmetry, speech difficulty, light-headedness and numbness.  Hematological: Negative.   Psychiatric/Behavioral: The patient is nervous/anxious.  Filed Vitals:   06/16/13 1230  BP: 122/64  Pulse: 77  Temp: 98.4 F (36.9 C)  TempSrc: Oral  Resp: 18  Height: 5' 8"  (1.727 m)  Weight: 185 lb 6.4 oz (84.097 kg)  SpO2: 98%   Physical Exam  Constitutional: He is oriented to person, place, and time. He appears well-developed and well-nourished. No distress.  Significantly overweight.  HENT:  Head: Normocephalic and atraumatic.  Right Ear: External ear normal.  Left Ear: External ear normal.  Nose: Nose normal.  Mouth/Throat: Oropharynx is clear and moist.  Eyes: Conjunctivae and EOM are normal. Pupils are equal, round, and reactive to light.  Neck: Normal range of motion. Neck supple. No JVD present. No tracheal deviation present. No  thyromegaly present.  Hoarse.   Cardiovascular: Normal rate, regular rhythm, normal heart sounds and intact distal pulses.  Exam reveals no gallop and no friction rub.   No murmur heard. Pulmonary/Chest: Effort normal. No respiratory distress. He has no wheezes. He has no rales. He exhibits no tenderness.  Abdominal: Bowel sounds are normal. He exhibits no distension and no mass.  Musculoskeletal: He exhibits no edema and no tenderness.  Bilateral shoulder discomfort with some restriction of movement especially at the left shoulder. Painful to raise left arm. Painful to lift items and to rotate at left shoulder. Has been seen by Dr. Noemi Chapel. Chronic neck pains. Tender at insertion of the left Achilles tendon.  Lymphadenopathy:    He has no cervical adenopathy.  Neurological: He is alert and oriented to person, place, and time. He has normal reflexes. No cranial nerve deficit. Coordination normal.  Skin: Skin is warm and dry. No rash noted. No erythema. No pallor.  Psychiatric: He has a normal mood and affect. His behavior is normal. Judgment and thought content normal.     Labs reviewed: Hospital Outpatient Visit on 06/07/2013  Component Date Value Ref Range Status  . Glucose-Capillary 06/07/2013 153* 70 - 99 mg/dL Final  Admission on 04/30/2013, Discharged on 04/30/2013  Component Date Value Ref Range Status  . Sodium 04/30/2013 139  137 - 147 mEq/L Final  . Potassium 04/30/2013 3.9  3.7 - 5.3 mEq/L Final  . Chloride 04/30/2013 101  96 - 112 mEq/L Final  . BUN 04/30/2013 15  6 - 23 mg/dL Final  . Creatinine, Ser 04/30/2013 1.10  0.50 - 1.35 mg/dL Final  . Glucose, Bld 04/30/2013 171* 70 - 99 mg/dL Final  . Calcium, Ion 04/30/2013 1.14  1.13 - 1.30 mmol/L Final  . TCO2 04/30/2013 24  0 - 100 mmol/L Final  . Hemoglobin 04/30/2013 11.9* 13.0 - 17.0 g/dL Final  . HCT 04/30/2013 35.0* 39.0 - 52.0 % Final  . Troponin i, poc 04/30/2013 0.00  0.00 - 0.08 ng/mL Final  . Comment 3 04/30/2013           Final   Comment: Due to the release kinetics of cTnI,                          a negative result within the first hours                          of the onset of symptoms does not rule out                          myocardial infarction with certainty.  If myocardial infarction is still suspected,                          repeat the test at appropriate intervals.  Admission on 04/30/2013, Discharged on 04/30/2013  Component Date Value Ref Range Status  . Glucose-Capillary 04/30/2013 127* 70 - 99 mg/dL Final  Admission on 03/26/2013, Discharged on 03/27/2013  Component Date Value Ref Range Status  . WBC 03/26/2013 5.4  4.0 - 10.5 K/uL Final  . RBC 03/26/2013 4.61  4.22 - 5.81 MIL/uL Final  . Hemoglobin 03/26/2013 14.3  13.0 - 17.0 g/dL Final  . HCT 03/26/2013 39.5  39.0 - 52.0 % Final  . MCV 03/26/2013 85.7  78.0 - 100.0 fL Final  . MCH 03/26/2013 31.0  26.0 - 34.0 pg Final  . MCHC 03/26/2013 36.2* 30.0 - 36.0 g/dL Final  . RDW 03/26/2013 12.3  11.5 - 15.5 % Final  . Platelets 03/26/2013 118* 150 - 400 K/uL Final   PLATELET COUNT CONFIRMED BY SMEAR  . Sodium 03/26/2013 141  137 - 147 mEq/L Final  . Potassium 03/26/2013 4.1  3.7 - 5.3 mEq/L Final  . Chloride 03/26/2013 105  96 - 112 mEq/L Final  . CO2 03/26/2013 25  19 - 32 mEq/L Final  . Glucose, Bld 03/26/2013 120* 70 - 99 mg/dL Final  . BUN 03/26/2013 19  6 - 23 mg/dL Final  . Creatinine, Ser 03/26/2013 1.06  0.50 - 1.35 mg/dL Final  . Calcium 03/26/2013 9.0  8.4 - 10.5 mg/dL Final  . GFR calc non Af Amer 03/26/2013 73* >90 mL/min Final  . GFR calc Af Amer 03/26/2013 84* >90 mL/min Final   Comment: (NOTE)                          The eGFR has been calculated using the CKD EPI equation.                          This calculation has not been validated in all clinical situations.                          eGFR's persistently <90 mL/min signify possible Chronic Kidney                          Disease.    . Troponin i, poc 03/26/2013 0.01  0.00 - 0.08 ng/mL Final  . Comment 3 03/26/2013          Final   Comment: Due to the release kinetics of cTnI,                          a negative result within the first hours                          of the onset of symptoms does not rule out                          myocardial infarction with certainty.                          If myocardial infarction is still suspected,  repeat the test at appropriate intervals.  Marland Kitchen MRSA by PCR 03/26/2013 NEGATIVE  NEGATIVE Final   Comment:                                 The GeneXpert MRSA Assay (FDA                          approved for NASAL specimens                          only), is one component of a                          comprehensive MRSA colonization                          surveillance program. It is not                          intended to diagnose MRSA                          infection nor to guide or                          monitor treatment for                          MRSA infections.  . WBC 03/27/2013 4.5  4.0 - 10.5 K/uL Final  . RBC 03/27/2013 4.21* 4.22 - 5.81 MIL/uL Final  . Hemoglobin 03/27/2013 12.8* 13.0 - 17.0 g/dL Final  . HCT 03/27/2013 36.5* 39.0 - 52.0 % Final  . MCV 03/27/2013 86.7  78.0 - 100.0 fL Final  . MCH 03/27/2013 30.4  26.0 - 34.0 pg Final  . MCHC 03/27/2013 35.1  30.0 - 36.0 g/dL Final  . RDW 03/27/2013 12.7  11.5 - 15.5 % Final  . Platelets 03/27/2013 108* 150 - 400 K/uL Final   CONSISTENT WITH PREVIOUS RESULT  . Troponin I 03/27/2013 <0.30  <0.30 ng/mL Final   Comment:                                 Due to the release kinetics of cTnI,                          a negative result within the first hours                          of the onset of symptoms does not rule out                          myocardial infarction with certainty.                          If myocardial infarction is still suspected,                          repeat the  test at appropriate intervals.  Marland Kitchen  Glucose-Capillary 03/26/2013 69* 70 - 99 mg/dL Final  . Comment 1 03/26/2013 Notify RN   Final  . Activated Clotting Time 03/26/2013 271   Final  . Glucose-Capillary 03/26/2013 137* 70 - 99 mg/dL Final  . Comment 1 03/26/2013 Documented in Chart   Final  . Comment 2 03/26/2013 ROSELLE RN   Final  . Glucose-Capillary 03/27/2013 84  70 - 99 mg/dL Final  . Comment 1 03/27/2013 Notify RN   Final  . Glucose-Capillary 03/26/2013 73  70 - 99 mg/dL Final      Assessment/Plan  1. Cough - azithromycin (ZITHROMAX) 250 MG tablet; 2 initially, then 1 daily for infection  Dispense: 6 tablet; Refill: 0 - dextromethorphan (DELSYM) 30 MG/5ML liquid; 10 cc every 12 hours as needed for cough  Dispense: 148 mL; Refill: 0 - pseudoephedrine-guaifenesin (MUCINEX D) 60-600 MG per tablet; One tablet every 12 hours to help congestion  Dispense: 20 tablet; Refill: 2  2. Achilles tendon pain Try Myoflex or Aspercreme

## 2013-07-25 ENCOUNTER — Other Ambulatory Visit: Payer: Self-pay | Admitting: Internal Medicine

## 2013-07-27 ENCOUNTER — Other Ambulatory Visit: Payer: Self-pay | Admitting: Internal Medicine

## 2013-08-02 ENCOUNTER — Other Ambulatory Visit: Payer: Self-pay | Admitting: *Deleted

## 2013-08-02 DIAGNOSIS — E1159 Type 2 diabetes mellitus with other circulatory complications: Secondary | ICD-10-CM

## 2013-08-02 MED ORDER — INSULIN GLARGINE 100 UNIT/ML ~~LOC~~ SOLN: SUBCUTANEOUS | Status: DC

## 2013-08-02 NOTE — Telephone Encounter (Signed)
[facility] [address] 

## 2013-08-05 ENCOUNTER — Other Ambulatory Visit: Payer: Self-pay | Admitting: Internal Medicine

## 2013-08-26 ENCOUNTER — Other Ambulatory Visit: Payer: BC Managed Care – PPO

## 2013-08-26 DIAGNOSIS — E1159 Type 2 diabetes mellitus with other circulatory complications: Secondary | ICD-10-CM

## 2013-08-26 DIAGNOSIS — E785 Hyperlipidemia, unspecified: Secondary | ICD-10-CM

## 2013-08-26 DIAGNOSIS — R0602 Shortness of breath: Secondary | ICD-10-CM

## 2013-08-27 LAB — LIPID PANEL
CHOLESTEROL TOTAL: 114 mg/dL (ref 100–199)
Chol/HDL Ratio: 3.4 ratio units (ref 0.0–5.0)
HDL: 34 mg/dL — AB (ref >39)
LDL Calculated: 46 mg/dL (ref 0–99)
TRIGLYCERIDES: 168 mg/dL — AB (ref 0–149)
VLDL CHOLESTEROL CAL: 34 mg/dL (ref 5–40)

## 2013-08-27 LAB — COMPREHENSIVE METABOLIC PANEL
A/G RATIO: 2 (ref 1.1–2.5)
ALT: 24 IU/L (ref 0–44)
AST: 19 IU/L (ref 0–40)
Albumin: 4.4 g/dL (ref 3.6–4.8)
Alkaline Phosphatase: 58 IU/L (ref 39–117)
BUN/Creatinine Ratio: 15 (ref 10–22)
BUN: 17 mg/dL (ref 8–27)
CALCIUM: 10 mg/dL (ref 8.6–10.2)
CHLORIDE: 101 mmol/L (ref 97–108)
CO2: 25 mmol/L (ref 18–29)
Creatinine, Ser: 1.14 mg/dL (ref 0.76–1.27)
GFR calc Af Amer: 78 mL/min/{1.73_m2} (ref >59)
GFR, EST NON AFRICAN AMERICAN: 68 mL/min/{1.73_m2} (ref >59)
Globulin, Total: 2.2 g/dL (ref 1.5–4.5)
Glucose: 154 mg/dL — ABNORMAL HIGH (ref 65–99)
POTASSIUM: 5.4 mmol/L — AB (ref 3.5–5.2)
Sodium: 140 mmol/L (ref 134–144)
Total Bilirubin: 0.3 mg/dL (ref 0.0–1.2)
Total Protein: 6.6 g/dL (ref 6.0–8.5)

## 2013-08-27 LAB — HEMOGLOBIN A1C
Est. average glucose Bld gHb Est-mCnc: 209 mg/dL
Hgb A1c MFr Bld: 8.9 % — ABNORMAL HIGH (ref 4.8–5.6)

## 2013-08-27 LAB — MICROALBUMIN / CREATININE URINE RATIO
Creatinine, Ur: 29.5 mg/dL (ref 22.0–328.0)
MICROALB/CREAT RATIO: <10.2 mg/g creat (ref 0.0–30.0)
Microalbumin, Urine: <3 ug/mL (ref 0.0–17.0)

## 2013-08-27 NOTE — Progress Notes (Signed)
Labs will be discussed with patient on visit (08/31/2013) SRice RMA

## 2013-08-31 ENCOUNTER — Ambulatory Visit (INDEPENDENT_AMBULATORY_CARE_PROVIDER_SITE_OTHER): Payer: BC Managed Care – PPO | Admitting: Internal Medicine

## 2013-08-31 ENCOUNTER — Encounter: Payer: Self-pay | Admitting: Internal Medicine

## 2013-08-31 VITALS — BP 138/88 | HR 73 | Temp 97.8°F | Resp 18 | Ht 68.0 in | Wt 185.6 lb

## 2013-08-31 DIAGNOSIS — R5383 Other fatigue: Secondary | ICD-10-CM

## 2013-08-31 DIAGNOSIS — I10 Essential (primary) hypertension: Secondary | ICD-10-CM

## 2013-08-31 DIAGNOSIS — G2581 Restless legs syndrome: Secondary | ICD-10-CM

## 2013-08-31 DIAGNOSIS — IMO0001 Reserved for inherently not codable concepts without codable children: Secondary | ICD-10-CM

## 2013-08-31 DIAGNOSIS — R0602 Shortness of breath: Secondary | ICD-10-CM | POA: Insufficient documentation

## 2013-08-31 DIAGNOSIS — E1159 Type 2 diabetes mellitus with other circulatory complications: Secondary | ICD-10-CM

## 2013-08-31 DIAGNOSIS — E785 Hyperlipidemia, unspecified: Secondary | ICD-10-CM

## 2013-08-31 DIAGNOSIS — R5381 Other malaise: Secondary | ICD-10-CM

## 2013-08-31 DIAGNOSIS — E1165 Type 2 diabetes mellitus with hyperglycemia: Principal | ICD-10-CM

## 2013-08-31 MED ORDER — PRAMIPEXOLE DIHYDROCHLORIDE 0.75 MG PO TABS: ORAL_TABLET | ORAL | Status: DC

## 2013-08-31 MED ORDER — INSULIN GLARGINE 100 UNIT/ML SOLOSTAR PEN: PEN_INJECTOR | SUBCUTANEOUS | Status: DC

## 2013-08-31 MED ORDER — BD PEN NEEDLE NANO U/F 32G X 4 MM MISC: Status: DC

## 2013-08-31 NOTE — Patient Instructions (Signed)
Use medication as indicated

## 2013-08-31 NOTE — Progress Notes (Signed)
Patient ID: [name], male   DOB: 06-25-1950, 63 y.o.   MRN: [id number]    Location:    [facility]  Place of Service:  OFFICE    No Known Allergies  Chief Complaint  Patient presents with  . Follow-up    HPI:  Type II or unspecified type diabetes mellitus without mention of complication, uncontrolled - poor control  Type II or unspecified type diabetes mellitus with peripheral circulatory disorders, uncontrolled(250.72): poor control  Restless legs syndrome (RLS): still a problem. Responds to Mirapex, but he is using twice daily on may days  Other malaise and fatigue: possibly related to poor control of diabetes  Hyperlipidemia: controlled  Hypertension: controlled  Shortness of breath: worse with exertion. .  Medications: Patient's Medications  New Prescriptions   No medications on file  Previous Medications   ACETAMINOPHEN (TYLENOL) 500 MG TABLET    Take 1,000-1,500 mg by mouth every 6 (six) hours as needed for headache.   ASPIRIN 81 MG EC TABLET    Take 1 tablet (81 mg total) by mouth daily.   ATORVASTATIN (LIPITOR) 20 MG TABLET    Take 20 mg by mouth every morning.    CARVEDILOL (COREG) 6.25 MG TABLET    Take 6.25 mg by mouth 2 (two) times daily with a meal.   CYCLOBENZAPRINE (FLEXERIL) 5 MG TABLET    Take 5 mg by mouth at bedtime.    GLYBURIDE (DIABETA) 2.5 MG TABLET    Take 1 tablet (2.5 mg total) by mouth daily with breakfast.   INSULIN GLARGINE (LANTUS) 100 UNIT/ML INJECTION    Take 20 units in the morning and 24 units in the evening   LANTUS SOLOSTAR 100 UNIT/ML SOLOSTAR PEN    INJECT 10 UNITS ONCE DAILY AND INCREASE AS DIRECTED   NITROGLYCERIN (NITROSTAT) 0.4 MG SL TABLET    Place 0.4 mg under the tongue every 5 (five) minutes as needed for chest pain.   ONE TOUCH ULTRA TEST TEST STRIP    TEST TWICE A DAY   PANTOPRAZOLE (PROTONIX) 40 MG TABLET    TAKE 1 TABLET (40 MG TOTAL) BY MOUTH 2 (TWO) TIMES DAILY.   PRAMIPEXOLE (MIRAPEX) 0.75 MG TABLET    Take 0.375-0.75 mg  by mouth at bedtime as needed (for restless leg).   PRAMIPEXOLE (MIRAPEX) 0.75 MG TABLET    TAKE 1/2 TABLET BY MOUTH EVERY NIGHT FOR RESTLESS LEGS   PRAMIPEXOLE (MIRAPEX) 0.75 MG TABLET    TAKE 1/2 TABLET BY MOUTH EVERY NIGHT FOR RESTLESS LEGS   PRASUGREL (EFFIENT) 10 MG TABS TABLET    Take 1 tablet (10 mg total) by mouth daily.  Modified Medications   Modified Medication Previous Medication   BD PEN NEEDLE NANO U/F 32G X 4 MM MISC BD PEN NEEDLE NANO U/F 32G X 4 MM MISC      Use one needle daily with Lantus pen    Use one needle daily with Lantus pen  Discontinued Medications   AZITHROMYCIN (ZITHROMAX) 250 MG TABLET    2 initially, then 1 daily for infection   DEXTROMETHORPHAN (DELSYM) 30 MG/5ML LIQUID    10 cc every 12 hours as needed for cough   GUAIFENESIN (ROBITUSSIN) 100 MG/5ML LIQUID    Take 300 mg by mouth 3 (three) times daily as needed for cough.   PSEUDOEPHEDRINE-GUAIFENESIN (MUCINEX D) 60-600 MG PER TABLET    One tablet every 12 hours to help congestion     Review of Systems  Constitutional: Positive for fatigue.  Negative for fever.  HENT: Positive for hearing loss. Negative for congestion and ear pain.   Eyes: Negative.        Wears prescription lenses.  Respiratory: Negative for shortness of breath.   Cardiovascular: Negative for chest pain, palpitations and leg swelling.  Endocrine: Negative for cold intolerance, heat intolerance, polydipsia, polyphagia and polyuria.       Diabetes under poor control.  Genitourinary: Negative.   Musculoskeletal: Positive for arthralgias, back pain, gait problem, joint swelling and myalgias. Negative for neck pain and neck stiffness.  Skin: Negative.   Allergic/Immunologic: Negative.   Neurological: Positive for weakness and headaches. Negative for dizziness, tremors, seizures, syncope, facial asymmetry, speech difficulty, light-headedness and numbness.  Hematological: Negative.   Psychiatric/Behavioral: The patient is nervous/anxious.      Filed Vitals:   08/31/13 1449  BP: 138/88  Pulse: 73  Temp: 97.8 F (36.6 C)  TempSrc: Oral  Resp: 18  Height: 5\' 8"  (1.727 m)  Weight: 185 lb 9.6 oz (84.188 kg)  SpO2: 96%   Body mass index is 28.23 kg/(m^2).  Physical Exam  Constitutional: He is oriented to person, place, and time. He appears well-developed and well-nourished. No distress.  Significantly overweight.  HENT:  Head: Normocephalic and atraumatic.  Right Ear: External ear normal.  Left Ear: External ear normal.  Nose: Nose normal.  Mouth/Throat: Oropharynx is clear and moist.  Eyes: Conjunctivae and EOM are normal. Pupils are equal, round, and reactive to light.  Neck: Normal range of motion. Neck supple. No JVD present. No tracheal deviation present. No thyromegaly present.  Hoarse.   Cardiovascular: Normal rate, regular rhythm, normal heart sounds and intact distal pulses.  Exam reveals no gallop and no friction rub.   No murmur heard. Pulmonary/Chest: Effort normal. No respiratory distress. He has no wheezes. He has no rales. He exhibits no tenderness.  Abdominal: Bowel sounds are normal. He exhibits no distension and no mass.  Musculoskeletal: He exhibits no edema and no tenderness.  Bilateral shoulder discomfort with some restriction of movement especially at the left shoulder. Painful to raise left arm. Painful to lift items and to rotate at left shoulder. Has been seen by Dr. Thurston HoleWainer. Chronic neck pains. Tender at insertion of the left Achilles tendon.  Lymphadenopathy:    He has no cervical adenopathy.  Neurological: He is alert and oriented to person, place, and time. He has normal reflexes. No cranial nerve deficit. Coordination normal.  Skin: Skin is warm and dry. No rash noted. No erythema. No pallor.  Psychiatric: He has a normal mood and affect. His behavior is normal. Judgment and thought content normal.     Labs reviewed: Appointment on 08/26/2013  Component Date Value Ref Range Status   . Hemoglobin A1C 08/26/2013 8.9* 4.8 - 5.6 % Final   Comment:          Increased risk for diabetes: 5.7 - 6.4                                   Diabetes: >6.4                                   Glycemic control for adults with diabetes: <7.0  . Estimated average glucose 08/26/2013 209   Final  . Glucose 08/26/2013 154* 65 - 99 mg/dL Final  . BUN 65/78/469606/25/2015 17  8 - 27 mg/dL Final  . Creatinine, Ser 08/26/2013 1.14  0.76 - 1.27 mg/dL Final  . GFR calc non Af Amer 08/26/2013 68  >59 mL/min/1.73 Final  . GFR calc Af Amer 08/26/2013 78  >59 mL/min/1.73 Final  . BUN/Creatinine Ratio 08/26/2013 15  10 - 22 Final  . Sodium 08/26/2013 140  134 - 144 mmol/L Final  . Potassium 08/26/2013 5.4* 3.5 - 5.2 mmol/L Final  . Chloride 08/26/2013 101  97 - 108 mmol/L Final  . CO2 08/26/2013 25  18 - 29 mmol/L Final  . Calcium 08/26/2013 10.0  8.6 - 10.2 mg/dL Final  . Total Protein 08/26/2013 6.6  6.0 - 8.5 g/dL Final  . Albumin 52/84/132406/25/2015 4.4  3.6 - 4.8 g/dL Final  . Globulin, Total 08/26/2013 2.2  1.5 - 4.5 g/dL Final  . Albumin/Globulin Ratio 08/26/2013 2.0  1.1 - 2.5 Final  . Total Bilirubin 08/26/2013 0.3  0.0 - 1.2 mg/dL Final  . Alkaline Phosphatase 08/26/2013 58  39 - 117 IU/L Final  . AST 08/26/2013 19  0 - 40 IU/L Final  . ALT 08/26/2013 24  0 - 44 IU/L Final  . Cholesterol, Total 08/26/2013 114  100 - 199 mg/dL Final  . Triglycerides 08/26/2013 168* 0 - 149 mg/dL Final  . HDL 40/10/272506/25/2015 34* >39 mg/dL Final   Comment: According to ATP-III Guidelines, HDL-C >59 mg/dL is considered a                          negative risk factor for CHD.  Marland Kitchen. VLDL Cholesterol Cal 08/26/2013 34  5 - 40 mg/dL Final  . LDL Calculated 08/26/2013 46  0 - 99 mg/dL Final  . Chol/HDL Ratio 08/26/2013 3.4  0.0 - 5.0 ratio units Final   Comment:                                   T. Chol/HDL Ratio                                                                      Men  Women                                                         1/2 Avg.Risk  3.4    3.3                                                            Avg.Risk  5.0    4.4  2X Avg.Risk  9.6    7.1                                                         3X Avg.Risk 23.4   11.0  . Creatinine, Ur 08/26/2013 29.5  22.0 - 328.0 mg/dL Final  . Microalbum.,U,Random 08/26/2013 <3.0  0.0 - 17.0 ug/mL Final  . MICROALB/CREAT RATIO 08/26/2013 <10.2  0.0 - 30.0 mg/g creat Final  Hospital Outpatient Visit on 06/07/2013  Component Date Value Ref Range Status  . Glucose-Capillary 06/07/2013 153* 70 - 99 mg/dL Final      Assessment/Plan  1. Type II or unspecified type diabetes mellitus without mention of complication, uncontrolled Stop glyburide and adjust Lantus as needed to get avg 3 day value , 150. - BD PEN NEEDLE NANO U/F 32G X 4 MM MISC; Use one needle daily with Lantus pen  Dispense: 100 each; Refill: 12 - Insulin Glargine (LANTUS SOLOSTAR) 100 UNIT/ML Solostar Pen; Inject 20 units in the morning and 24 units in the evening to control diabetes  Dispense: 15 pen; Refill: 6 - Hemoglobin A1c; Future - Basic metabolic panel; Future  2. Type II or unspecified type diabetes mellitus with peripheral circulatory disorders, uncontrolled(250.72)  3. Restless legs syndrome (RLS) - pramipexole (MIRAPEX) 0.75 MG tablet; 1/2 tablet up to twice daly to help prevent leg spasm.  Dispense: 30 tablet; Refill: 5  4. Other malaise and fatigue Lose weight and control diabetes  5. Hyperlipidemia controlled  6. Hypertension controlled  7. Shortness of breath Lose weight and control diabetes.

## 2013-09-08 ENCOUNTER — Other Ambulatory Visit: Payer: Self-pay | Admitting: Internal Medicine

## 2013-09-14 ENCOUNTER — Ambulatory Visit (INDEPENDENT_AMBULATORY_CARE_PROVIDER_SITE_OTHER): Payer: BC Managed Care – PPO | Admitting: Internal Medicine

## 2013-09-14 ENCOUNTER — Encounter: Payer: Self-pay | Admitting: Internal Medicine

## 2013-09-14 VITALS — BP 130/84 | HR 67 | Temp 97.6°F | Resp 18 | Wt 185.6 lb

## 2013-09-14 DIAGNOSIS — E1159 Type 2 diabetes mellitus with other circulatory complications: Secondary | ICD-10-CM

## 2013-09-14 DIAGNOSIS — R0602 Shortness of breath: Secondary | ICD-10-CM

## 2013-09-14 DIAGNOSIS — G2581 Restless legs syndrome: Secondary | ICD-10-CM

## 2013-09-14 DIAGNOSIS — D649 Anemia, unspecified: Secondary | ICD-10-CM

## 2013-09-14 DIAGNOSIS — I10 Essential (primary) hypertension: Secondary | ICD-10-CM

## 2013-09-14 MED ORDER — TIZANIDINE HCL 4 MG PO TABS: ORAL_TABLET | ORAL | Status: DC

## 2013-09-14 MED ORDER — FLUTICASONE FUROATE-VILANTEROL 100-25 MCG/INH IN AEPB: INHALATION_SPRAY | RESPIRATORY_TRACT | Status: DC

## 2013-09-14 NOTE — Progress Notes (Signed)
Patient ID: Kenneth BolsterRalph K Caradine, male   DOB: 02-21-1951, 63 y.o.   MRN: [id number]    Location:    [facility]  Place of Service:  OFFICE    No Known Allergies  Chief Complaint  Patient presents with  . Acute Visit    SOB on exertion x 2-3 weeks, and restlss legs syndrom  everyday with last 4 days being worse  . other    ? wants Ferritin blood work done.    HPI:  Type II or unspecified type diabetes mellitus with peripheral circulatory disorders, uncontrolled(250.72): home glucose is now down to about 135. He is using the 3 day averaging technique to regulate the amount of insulin he is using  Restless legs syndrome (RLS) - this is a terrible problem for him. The mirapex is not helping.  Shortness of breath - worsening. Any exertion makes the breathing worse. Previously did better when he used an inhaler  Hypertension: controlled    Medications: Patient's Medications  New Prescriptions   No medications on file  Previous Medications   ACETAMINOPHEN (TYLENOL) 500 MG TABLET    Take 1,000-1,500 mg by mouth every 6 (six) hours as needed for headache.   ASPIRIN 81 MG EC TABLET    Take 1 tablet (81 mg total) by mouth daily.   ATORVASTATIN (LIPITOR) 20 MG TABLET    Take 20 mg by mouth every morning.    BD PEN NEEDLE NANO U/F 32G X 4 MM MISC    Use one needle daily with Lantus pen   CARVEDILOL (COREG) 6.25 MG TABLET    Take 6.25 mg by mouth 2 (two) times daily with a meal.   GLYBURIDE (DIABETA) 2.5 MG TABLET       INSULIN GLARGINE (LANTUS SOLOSTAR) 100 UNIT/ML SOLOSTAR PEN    Inject 20 units in the morning and 24 units in the evening to control diabetes   NITROGLYCERIN (NITROSTAT) 0.4 MG SL TABLET    Place 0.4 mg under the tongue every 5 (five) minutes as needed for chest pain.   ONE TOUCH ULTRA TEST TEST STRIP    TEST TWICE A DAY   PANTOPRAZOLE (PROTONIX) 40 MG TABLET    TAKE 1 TABLET (40 MG TOTAL) BY MOUTH 2 (TWO) TIMES DAILY.   PRAMIPEXOLE (MIRAPEX) 0.75 MG TABLET    1/2 tablet up to twice  daly to help prevent leg spasm.   PRASUGREL (EFFIENT) 10 MG TABS TABLET    Take 1 tablet (10 mg total) by mouth daily.  Modified Medications   No medications on file  Discontinued Medications   No medications on file     Review of Systems  Constitutional: Positive for fatigue. Negative for fever.  HENT: Positive for hearing loss. Negative for congestion and ear pain.   Eyes: Negative.        Wears prescription lenses.  Respiratory: Positive for chest tightness and shortness of breath (with exertion). Negative for cough, choking and wheezing.   Cardiovascular: Negative for chest pain, palpitations and leg swelling.  Gastrointestinal: Negative for nausea, vomiting, abdominal pain, diarrhea, constipation and abdominal distention.       Barrett's esophagus and reflux history  Endocrine: Negative for cold intolerance, heat intolerance, polydipsia, polyphagia and polyuria.       Diabetes under poor control.  Genitourinary: Negative.   Musculoskeletal: Positive for arthralgias, back pain, gait problem, joint swelling and myalgias. Negative for neck pain and neck stiffness.  Skin: Negative.   Allergic/Immunologic: Negative.   Neurological: Positive for weakness  and headaches. Negative for dizziness, tremors, seizures, syncope, facial asymmetry, speech difficulty, light-headedness and numbness.       Severe restless legs  Hematological: Negative.   Psychiatric/Behavioral: The patient is nervous/anxious.     Filed Vitals:   09/14/13 1408  BP: 130/84  Pulse: 67  Temp: 97.6 F (36.4 C)  TempSrc: Oral  Resp: 18  Weight: 185 lb 9.6 oz (84.188 kg)  SpO2: 95%   Body mass index is 28.23 kg/(m^2).  Physical Exam  Constitutional: He is oriented to person, place, and time. He appears well-developed and well-nourished. No distress.  Significantly overweight.  HENT:  Head: Normocephalic and atraumatic.  Right Ear: External ear normal.  Left Ear: External ear normal.  Nose: Nose normal.    Mouth/Throat: Oropharynx is clear and moist.  Eyes: Conjunctivae and EOM are normal. Pupils are equal, round, and reactive to light.  Neck: Normal range of motion. Neck supple. No JVD present. No tracheal deviation present. No thyromegaly present.  Hoarse.   Cardiovascular: Normal rate, regular rhythm, normal heart sounds and intact distal pulses.  Exam reveals no gallop and no friction rub.   No murmur heard. Pulmonary/Chest: Effort normal. No respiratory distress. He has no wheezes. He has no rales. He exhibits no tenderness.  Abdominal: Bowel sounds are normal. He exhibits no distension and no mass.  Musculoskeletal: He exhibits no edema and no tenderness.  Bilateral shoulder discomfort with some restriction of movement especially at the left shoulder. Painful to raise left arm. Painful to lift items and to rotate at left shoulder. Has been seen by Dr. Thurston Hole. Chronic neck pains. Tender at insertion of the left Achilles tendon.  Lymphadenopathy:    He has no cervical adenopathy.  Neurological: He is alert and oriented to person, place, and time. He has normal reflexes. No cranial nerve deficit. Coordination normal.  Bilateral restless legs. He is in constant motion in the exam room to keep himself comfortable.  Skin: Skin is warm and dry. No rash noted. No erythema. No pallor.  Psychiatric: He has a normal mood and affect. His behavior is normal. Judgment and thought content normal.     Labs reviewed: Appointment on 08/26/2013  Component Date Value Ref Range Status  . Hemoglobin A1C 08/26/2013 8.9* 4.8 - 5.6 % Final   Comment:          Increased risk for diabetes: 5.7 - 6.4                                   Diabetes: >6.4                                   Glycemic control for adults with diabetes: <7.0  . Estimated average glucose 08/26/2013 209   Final  . Glucose 08/26/2013 154* 65 - 99 mg/dL Final  . BUN 16/12/9602 17  8 - 27 mg/dL Final  . Creatinine, Ser 08/26/2013 1.14  0.76  - 1.27 mg/dL Final  . GFR calc non Af Amer 08/26/2013 68  >59 mL/min/1.73 Final  . GFR calc Af Amer 08/26/2013 78  >59 mL/min/1.73 Final  . BUN/Creatinine Ratio 08/26/2013 15  10 - 22 Final  . Sodium 08/26/2013 140  134 - 144 mmol/L Final  . Potassium 08/26/2013 5.4* 3.5 - 5.2 mmol/L Final  . Chloride 08/26/2013 101  97 - 108 mmol/L  Final  . CO2 08/26/2013 25  18 - 29 mmol/L Final  . Calcium 08/26/2013 10.0  8.6 - 10.2 mg/dL Final  . Total Protein 08/26/2013 6.6  6.0 - 8.5 g/dL Final  . Albumin 16/12/9602 4.4  3.6 - 4.8 g/dL Final  . Globulin, Total 08/26/2013 2.2  1.5 - 4.5 g/dL Final  . Albumin/Globulin Ratio 08/26/2013 2.0  1.1 - 2.5 Final  . Total Bilirubin 08/26/2013 0.3  0.0 - 1.2 mg/dL Final  . Alkaline Phosphatase 08/26/2013 58  39 - 117 IU/L Final  . AST 08/26/2013 19  0 - 40 IU/L Final  . ALT 08/26/2013 24  0 - 44 IU/L Final  . Cholesterol, Total 08/26/2013 114  100 - 199 mg/dL Final  . Triglycerides 08/26/2013 168* 0 - 149 mg/dL Final  . HDL 54/11/8117 34* >39 mg/dL Final   Comment: According to ATP-III Guidelines, HDL-C >59 mg/dL is considered a                          negative risk factor for CHD.  Marland Kitchen VLDL Cholesterol Cal 08/26/2013 34  5 - 40 mg/dL Final  . LDL Calculated 08/26/2013 46  0 - 99 mg/dL Final  . Chol/HDL Ratio 08/26/2013 3.4  0.0 - 5.0 ratio units Final   Comment:                                   T. Chol/HDL Ratio                                                                      Men  Women                                                        1/2 Avg.Risk  3.4    3.3                                                            Avg.Risk  5.0    4.4                                                         2X Avg.Risk  9.6    7.1                                                         3X Avg.Risk 23.4   11.0  . Creatinine, Ur 08/26/2013 29.5  22.0 - 328.0 mg/dL Final  . Microalbum.,U,Random 08/26/2013 <3.0  0.0 - 17.0 ug/mL Final  . MICROALB/CREAT RATIO  08/26/2013 <10.2  0.0 - 30.0 mg/g creat Final      Assessment/Plan  1. Type II or unspecified type diabetes mellitus with peripheral circulatory disorders, uncontrolled(250.72) Continue to regulate insulin by averaging the preceding 3 days of glucose  2. Restless legs syndrome (RLS) Stop Mirapex - tiZANidine (ZANAFLEX) 4 MG tablet; One tablet every 8 hours if needed for restless legs  Dispense: 60 tablet; Refill: 0  3. Shortness of breath - Spirometry: Pre & Post Eval - Fluticasone Furoate-Vilanterol (BREO ELLIPTA) 100-25 MCG/INH AEPB; Inhale into the lungs once daily  Dispense: 1 each; Refill: 4  4. Hypertension controlled

## 2013-09-17 ENCOUNTER — Telehealth: Payer: Self-pay | Admitting: *Deleted

## 2013-09-17 NOTE — Telephone Encounter (Signed)
Patient called and stated that the medication is working somewhat but not near enough and wants the Neurologist appointment. And also is going to stop by the office and have his Ferritin level drawn. Please Advise.

## 2013-09-18 ENCOUNTER — Inpatient Hospital Stay (HOSPITAL_COMMUNITY)
Admission: EM | Admit: 2013-09-18 | Discharge: 2013-09-20 | DRG: 558 | Disposition: A | Discharge status: Home / Self Care | Payer: BC Managed Care – PPO | Attending: Internal Medicine | Admitting: Internal Medicine

## 2013-09-18 ENCOUNTER — Encounter (HOSPITAL_COMMUNITY): Payer: Self-pay | Admitting: Emergency Medicine

## 2013-09-18 ENCOUNTER — Emergency Department (HOSPITAL_COMMUNITY)
Admission: EM | Admit: 2013-09-18 | Discharge: 2013-09-18 | Disposition: A | Discharge status: Home / Self Care | Payer: BC Managed Care – PPO | Attending: Emergency Medicine | Admitting: Emergency Medicine

## 2013-09-18 DIAGNOSIS — Z0189 Encounter for other specified special examinations: Secondary | ICD-10-CM

## 2013-09-18 DIAGNOSIS — J4489 Other specified chronic obstructive pulmonary disease: Secondary | ICD-10-CM | POA: Diagnosis present

## 2013-09-18 DIAGNOSIS — E1151 Type 2 diabetes mellitus with diabetic peripheral angiopathy without gangrene: Secondary | ICD-10-CM | POA: Diagnosis present

## 2013-09-18 DIAGNOSIS — R5381 Other malaise: Secondary | ICD-10-CM

## 2013-09-18 DIAGNOSIS — Z794 Long term (current) use of insulin: Secondary | ICD-10-CM

## 2013-09-18 DIAGNOSIS — R1013 Epigastric pain: Secondary | ICD-10-CM

## 2013-09-18 DIAGNOSIS — D509 Iron deficiency anemia, unspecified: Secondary | ICD-10-CM | POA: Diagnosis present

## 2013-09-18 DIAGNOSIS — J449 Chronic obstructive pulmonary disease, unspecified: Secondary | ICD-10-CM | POA: Insufficient documentation

## 2013-09-18 DIAGNOSIS — R011 Cardiac murmur, unspecified: Secondary | ICD-10-CM | POA: Insufficient documentation

## 2013-09-18 DIAGNOSIS — E119 Type 2 diabetes mellitus without complications: Secondary | ICD-10-CM | POA: Insufficient documentation

## 2013-09-18 DIAGNOSIS — R5383 Other fatigue: Secondary | ICD-10-CM

## 2013-09-18 DIAGNOSIS — Z7982 Long term (current) use of aspirin: Secondary | ICD-10-CM

## 2013-09-18 DIAGNOSIS — M7660 Achilles tendinitis, unspecified leg: Secondary | ICD-10-CM

## 2013-09-18 DIAGNOSIS — I2510 Atherosclerotic heart disease of native coronary artery without angina pectoris: Secondary | ICD-10-CM | POA: Diagnosis present

## 2013-09-18 DIAGNOSIS — K219 Gastro-esophageal reflux disease without esophagitis: Secondary | ICD-10-CM | POA: Diagnosis present

## 2013-09-18 DIAGNOSIS — E1165 Type 2 diabetes mellitus with hyperglycemia: Secondary | ICD-10-CM

## 2013-09-18 DIAGNOSIS — I798 Other disorders of arteries, arterioles and capillaries in diseases classified elsewhere: Secondary | ICD-10-CM | POA: Diagnosis present

## 2013-09-18 DIAGNOSIS — I10 Essential (primary) hypertension: Secondary | ICD-10-CM

## 2013-09-18 DIAGNOSIS — Z9861 Coronary angioplasty status: Secondary | ICD-10-CM

## 2013-09-18 DIAGNOSIS — I959 Hypotension, unspecified: Secondary | ICD-10-CM | POA: Diagnosis not present

## 2013-09-18 DIAGNOSIS — Z87448 Personal history of other diseases of urinary system: Secondary | ICD-10-CM | POA: Insufficient documentation

## 2013-09-18 DIAGNOSIS — G2581 Restless legs syndrome: Secondary | ICD-10-CM

## 2013-09-18 DIAGNOSIS — Z9889 Other specified postprocedural states: Secondary | ICD-10-CM | POA: Insufficient documentation

## 2013-09-18 DIAGNOSIS — K2971 Gastritis, unspecified, with bleeding: Secondary | ICD-10-CM

## 2013-09-18 DIAGNOSIS — K429 Umbilical hernia without obstruction or gangrene: Secondary | ICD-10-CM

## 2013-09-18 DIAGNOSIS — E785 Hyperlipidemia, unspecified: Secondary | ICD-10-CM

## 2013-09-18 DIAGNOSIS — Z79899 Other long term (current) drug therapy: Secondary | ICD-10-CM

## 2013-09-18 DIAGNOSIS — R39198 Other difficulties with micturition: Secondary | ICD-10-CM

## 2013-09-18 DIAGNOSIS — Z87828 Personal history of other (healed) physical injury and trauma: Secondary | ICD-10-CM | POA: Insufficient documentation

## 2013-09-18 DIAGNOSIS — IMO0001 Reserved for inherently not codable concepts without codable children: Secondary | ICD-10-CM

## 2013-09-18 DIAGNOSIS — N529 Male erectile dysfunction, unspecified: Secondary | ICD-10-CM

## 2013-09-18 DIAGNOSIS — T466X5A Adverse effect of antihyperlipidemic and antiarteriosclerotic drugs, initial encounter: Secondary | ICD-10-CM | POA: Diagnosis present

## 2013-09-18 DIAGNOSIS — K2991 Gastroduodenitis, unspecified, with bleeding: Secondary | ICD-10-CM

## 2013-09-18 DIAGNOSIS — G44209 Tension-type headache, unspecified, not intractable: Secondary | ICD-10-CM

## 2013-09-18 DIAGNOSIS — E1159 Type 2 diabetes mellitus with other circulatory complications: Secondary | ICD-10-CM

## 2013-09-18 DIAGNOSIS — M542 Cervicalgia: Secondary | ICD-10-CM

## 2013-09-18 DIAGNOSIS — K2270 Barrett's esophagus without dysplasia: Secondary | ICD-10-CM

## 2013-09-18 DIAGNOSIS — R0602 Shortness of breath: Secondary | ICD-10-CM

## 2013-09-18 DIAGNOSIS — I200 Unstable angina: Secondary | ICD-10-CM

## 2013-09-18 DIAGNOSIS — R634 Abnormal weight loss: Secondary | ICD-10-CM

## 2013-09-18 DIAGNOSIS — M6282 Rhabdomyolysis: Principal | ICD-10-CM

## 2013-09-18 LAB — I-STAT CHEM 8, ED
BUN: 26 mg/dL — ABNORMAL HIGH (ref 6–23)
BUN: 20 mg/dL (ref 6–23)
CALCIUM ION: 1.23 mmol/L (ref 1.13–1.30)
Calcium, Ion: 1.17 mmol/L (ref 1.13–1.30)
Chloride: 109 mEq/L (ref 96–112)
Chloride: 105 mEq/L (ref 96–112)
Creatinine, Ser: 1.4 mg/dL — ABNORMAL HIGH (ref 0.50–1.35)
Creatinine, Ser: 1.2 mg/dL (ref 0.50–1.35)
GLUCOSE: 97 mg/dL (ref 70–99)
Glucose, Bld: 85 mg/dL (ref 70–99)
HCT: 37 % — ABNORMAL LOW (ref 39.0–52.0)
HEMATOCRIT: 38 % — AB (ref 39.0–52.0)
Hemoglobin: 12.9 g/dL — ABNORMAL LOW (ref 13.0–17.0)
Hemoglobin: 12.6 g/dL — ABNORMAL LOW (ref 13.0–17.0)
Potassium: 3.8 mEq/L (ref 3.7–5.3)
Potassium: 3.9 mEq/L (ref 3.7–5.3)
Sodium: 142 mEq/L (ref 137–147)
Sodium: 140 mEq/L (ref 137–147)
TCO2: 23 mmol/L (ref 0–100)
TCO2: 21 mmol/L (ref 0–100)

## 2013-09-18 LAB — CBC
HCT: 35.7 % — ABNORMAL LOW (ref 39.0–52.0)
Hemoglobin: 11.4 g/dL — ABNORMAL LOW (ref 13.0–17.0)
MCH: 24.9 pg — ABNORMAL LOW (ref 26.0–34.0)
MCHC: 31.9 g/dL (ref 30.0–36.0)
MCV: 78.1 fL (ref 78.0–100.0)
Platelets: 131 10*3/uL — ABNORMAL LOW (ref 150–400)
RBC: 4.57 MIL/uL (ref 4.22–5.81)
RDW: 13.8 % (ref 11.5–15.5)
WBC: 6.5 10*3/uL (ref 4.0–10.5)

## 2013-09-18 LAB — CK
Total CK: 1765 U/L — ABNORMAL HIGH (ref 7–232)
Total CK: 4907 U/L — ABNORMAL HIGH (ref 7–232)

## 2013-09-18 LAB — MAGNESIUM: MAGNESIUM: 1.9 mg/dL (ref 1.5–2.5)

## 2013-09-18 MED ORDER — ASPIRIN EC 81 MG PO TBEC
81.0000 mg | DELAYED_RELEASE_TABLET | Freq: Every day | ORAL | Status: DC
  Administered 2013-09-19 – 2013-09-20 (×2): 81 mg via ORAL
  Filled 2013-09-18 (×2): qty 1

## 2013-09-18 MED ORDER — ONDANSETRON HCL 4 MG/2ML IJ SOLN: 4.0000 mg | Freq: Four times a day (QID) | INTRAMUSCULAR | Status: DC | PRN

## 2013-09-18 MED ORDER — SODIUM CHLORIDE 0.9 % IV SOLN
INTRAVENOUS | Status: DC
  Administered 2013-09-18: 23:00:00 via INTRAVENOUS

## 2013-09-18 MED ORDER — SODIUM CHLORIDE 0.9 % IV BOLUS (SEPSIS)
500.0000 mL | Freq: Once | INTRAVENOUS | Status: AC
  Administered 2013-09-18: 500 mL via INTRAVENOUS

## 2013-09-18 MED ORDER — INSULIN GLARGINE 100 UNIT/ML ~~LOC~~ SOLN
20.0000 [IU] | Freq: Two times a day (BID) | SUBCUTANEOUS | Status: DC
  Administered 2013-09-19 – 2013-09-20 (×3): 20 [IU] via SUBCUTANEOUS
  Filled 2013-09-18 (×4): qty 0.2

## 2013-09-18 MED ORDER — METHOCARBAMOL 750 MG PO TABS: 750.0000 mg | ORAL_TABLET | Freq: Four times a day (QID) | ORAL | Status: DC

## 2013-09-18 MED ORDER — SODIUM CHLORIDE 0.9 % IV SOLN
INTRAVENOUS | Status: DC
  Administered 2013-09-19 (×3): via INTRAVENOUS
  Administered 2013-09-20: 125 mL/h via INTRAVENOUS

## 2013-09-18 MED ORDER — SENNA 8.6 MG PO TABS
1.0000 | ORAL_TABLET | Freq: Two times a day (BID) | ORAL | Status: DC
  Administered 2013-09-19 (×3): 8.6 mg via ORAL
  Filled 2013-09-18 (×5): qty 1

## 2013-09-18 MED ORDER — ACETAMINOPHEN 650 MG RE SUPP: 650.0000 mg | Freq: Four times a day (QID) | RECTAL | Status: DC | PRN

## 2013-09-18 MED ORDER — DIAZEPAM 5 MG PO TABS
5.0000 mg | ORAL_TABLET | Freq: Once | ORAL | Status: DC
  Filled 2013-09-18: qty 1

## 2013-09-18 MED ORDER — ONDANSETRON HCL 4 MG PO TABS: 4.0000 mg | ORAL_TABLET | Freq: Four times a day (QID) | ORAL | Status: DC | PRN

## 2013-09-18 MED ORDER — GABAPENTIN 300 MG PO CAPS
300.0000 mg | ORAL_CAPSULE | Freq: Once | ORAL | Status: DC
  Filled 2013-09-18: qty 1

## 2013-09-18 MED ORDER — METHOCARBAMOL 500 MG PO TABS
1000.0000 mg | ORAL_TABLET | Freq: Once | ORAL | Status: AC
  Administered 2013-09-18: 1000 mg via ORAL
  Filled 2013-09-18: qty 2

## 2013-09-18 MED ORDER — ACETAMINOPHEN 325 MG PO TABS: 650.0000 mg | ORAL_TABLET | Freq: Four times a day (QID) | ORAL | Status: DC | PRN

## 2013-09-18 MED ORDER — CARVEDILOL 6.25 MG PO TABS
6.2500 mg | ORAL_TABLET | Freq: Two times a day (BID) | ORAL | Status: DC
  Administered 2013-09-19 – 2013-09-20 (×3): 6.25 mg via ORAL
  Filled 2013-09-18 (×5): qty 1

## 2013-09-18 MED ORDER — POTASSIUM CHLORIDE CRYS ER 20 MEQ PO TBCR
40.0000 meq | EXTENDED_RELEASE_TABLET | Freq: Once | ORAL | Status: AC
  Administered 2013-09-18: 40 meq via ORAL
  Filled 2013-09-18: qty 2

## 2013-09-18 MED ORDER — HYDROMORPHONE HCL PF 1 MG/ML IJ SOLN
1.0000 mg | Freq: Once | INTRAMUSCULAR | Status: AC
  Administered 2013-09-18: 1 mg via INTRAVENOUS
  Filled 2013-09-18: qty 1

## 2013-09-18 MED ORDER — PRASUGREL HCL 10 MG PO TABS
10.0000 mg | ORAL_TABLET | Freq: Every day | ORAL | Status: DC
  Administered 2013-09-19 – 2013-09-20 (×3): 10 mg via ORAL
  Filled 2013-09-18 (×3): qty 1

## 2013-09-18 MED ORDER — SODIUM CHLORIDE 0.9 % IJ SOLN
3.0000 mL | Freq: Two times a day (BID) | INTRAMUSCULAR | Status: DC
  Administered 2013-09-19: 3 mL via INTRAVENOUS

## 2013-09-18 MED ORDER — DIAZEPAM 5 MG/ML IJ SOLN
5.0000 mg | Freq: Once | INTRAMUSCULAR | Status: AC
  Administered 2013-09-18: 5 mg via INTRAVENOUS
  Filled 2013-09-18: qty 2

## 2013-09-18 MED ORDER — KETOROLAC TROMETHAMINE 30 MG/ML IJ SOLN
30.0000 mg | Freq: Once | INTRAMUSCULAR | Status: AC
  Administered 2013-09-18: 30 mg via INTRAVENOUS
  Filled 2013-09-18: qty 1

## 2013-09-18 MED ORDER — GABAPENTIN 600 MG PO TABS
300.0000 mg | ORAL_TABLET | Freq: Once | ORAL | Status: DC
  Filled 2013-09-18: qty 0.5

## 2013-09-18 MED ORDER — DIAZEPAM 5 MG PO TABS
5.0000 mg | ORAL_TABLET | Freq: Three times a day (TID) | ORAL | Status: DC | PRN
  Administered 2013-09-19 (×3): 5 mg via ORAL
  Filled 2013-09-18 (×3): qty 1

## 2013-09-18 MED ORDER — HYDROMORPHONE HCL PF 1 MG/ML IJ SOLN
1.0000 mg | Freq: Once | INTRAMUSCULAR | Status: AC
  Administered 2013-09-18: 1 mg via INTRAMUSCULAR

## 2013-09-18 MED ORDER — HEPARIN SODIUM (PORCINE) 5000 UNIT/ML IJ SOLN
5000.0000 [IU] | Freq: Three times a day (TID) | INTRAMUSCULAR | Status: DC
Start: 2013-09-18 — End: 2013-09-20
  Administered 2013-09-19 – 2013-09-20 (×5): 5000 [IU] via SUBCUTANEOUS
  Filled 2013-09-18 (×8): qty 1

## 2013-09-18 MED ORDER — TIZANIDINE HCL 4 MG PO TABS: 4.0000 mg | ORAL_TABLET | Freq: Four times a day (QID) | ORAL | Status: DC | PRN

## 2013-09-18 MED ORDER — OXYCODONE-ACETAMINOPHEN 5-325 MG PO TABS
2.0000 | ORAL_TABLET | ORAL | Status: DC | PRN
  Administered 2013-09-19 (×2): 2 via ORAL
  Filled 2013-09-18 (×3): qty 2

## 2013-09-18 MED ORDER — SODIUM CHLORIDE 0.9 % IV BOLUS (SEPSIS)
1000.0000 mL | Freq: Once | INTRAVENOUS | Status: AC
  Administered 2013-09-18: 1000 mL via INTRAVENOUS

## 2013-09-18 MED ORDER — PRAMIPEXOLE DIHYDROCHLORIDE 0.125 MG PO TABS
0.1250 mg | ORAL_TABLET | Freq: Two times a day (BID) | ORAL | Status: DC
  Administered 2013-09-19: 0.125 mg via ORAL
  Filled 2013-09-18 (×3): qty 1

## 2013-09-18 MED ORDER — HYDROMORPHONE HCL PF 1 MG/ML IJ SOLN
1.0000 mg | Freq: Once | INTRAMUSCULAR | Status: DC
  Filled 2013-09-18: qty 1

## 2013-09-18 MED ORDER — DIAZEPAM 5 MG PO TABS: 5.0000 mg | ORAL_TABLET | Freq: Once | ORAL | Status: DC

## 2013-09-18 MED ORDER — OXYCODONE-ACETAMINOPHEN 5-325 MG PO TABS: 2.0000 | ORAL_TABLET | ORAL | Status: DC | PRN

## 2013-09-18 MED ORDER — PANTOPRAZOLE SODIUM 40 MG PO TBEC
40.0000 mg | DELAYED_RELEASE_TABLET | Freq: Two times a day (BID) | ORAL | Status: DC
  Administered 2013-09-19 – 2013-09-20 (×4): 40 mg via ORAL
  Filled 2013-09-18 (×4): qty 1

## 2013-09-18 MED ORDER — INSULIN GLARGINE 100 UNITS/ML SOLOSTAR PEN: 20.0000 [IU] | PEN_INJECTOR | Freq: Two times a day (BID) | SUBCUTANEOUS | Status: DC

## 2013-09-18 NOTE — Telephone Encounter (Signed)
Get neurology appointment for his restless legs syndrome.

## 2013-09-18 NOTE — Progress Notes (Signed)
Received report from [place], ED RN

## 2013-09-18 NOTE — ED Notes (Signed)
Pt with increased pain at this time.  Unable to sit or stand still.  Diaphoretic.

## 2013-09-18 NOTE — ED Notes (Signed)
MD at bedside. 

## 2013-09-18 NOTE — Discharge Instructions (Signed)
Restless Legs Syndrome Restless legs syndrome is a movement disorder. It may also be called a sensori-motor disorder.  CAUSES  No one knows what specifically causes restless legs syndrome, but it tends to run in families. It is also more common in people with low iron, in pregnancy, in people who need dialysis, and those with nerve damage (neuropathy).Some medications may make restless legs syndrome worse.Those medications include drugs to treat high blood pressure, some heart conditions, nausea, colds, allergies, and depression. SYMPTOMS Symptoms include uncomfortable sensations in the legs. These leg sensations are worse during periods of inactivity or rest. They are also worse while sitting or lying down. Individuals that have the disorder describe sensations in the legs that feel like:  Pulling.  Drawing.  Crawling.  Worming.  Boring.  Tingling.  Pins and needles.  Prickling.  Pain. The sensations are usually accompanied by an overwhelming urge to move the legs. Sudden muscle jerks may also occur. Movement provides temporary relief from the discomfort. In rare cases, the arms may also be affected. Symptoms may interfere with going to sleep (sleep onset insomnia). Restless legs syndrome may also be related to periodic limb movement disorder (PLMD). PLMD is another more common motor disorder. It also causes interrupted sleep. The symptoms from PLMD usually occur most often when you are awake. TREATMENT  Treatment for restless legs syndrome is symptomatic. This means that the symptoms are treated.   Massage and cold compresses may provide temporary relief.  Walk, stretch, or take a cold or hot bath.  Get regular exercise and a good night's sleep.  Avoid caffeine, alcohol, nicotine, and medications that can make it worse.  Do activities that provide mental stimulation like discussions, needlework, and video games. These may be helpful if you are not able to walk or  stretch. Some medications are effective in relieving the symptoms. However, many of these medications have side effects. Ask your caregiver about medications that may help your symptoms. Correcting iron deficiency may improve symptoms for some patients. Document Released: 02/08/2002 Document Revised: 05/13/2011 Document Reviewed: 05/17/2010 Exit[employer] Patient Information 2015 [employer]. This information is not intended to replace advice given to you by your health care provider. Make sure you discuss any questions you have with your health care provider.  

## 2013-09-18 NOTE — ED Notes (Signed)
Pt dancing around room, cannot hold legs still.  Reports he has "restless legs" for which he is on medication.  The dr changed his medication this week and he feels he is much worse.  He rates his pain a 10 but when asked about his expectations for the visit he said he just wants to get something so he can be still and quit shaking.

## 2013-09-18 NOTE — ED Provider Notes (Signed)
CSN: [id number]     Arrival date & time 09/18/13  1813 History   First MD Initiated Contact with Patient 09/18/13 1922     Chief Complaint  Patient presents with  . Leg Problem     (Consider location/radiation/quality/duration/timing/severity/associated sxs/prior Treatment) HPI Pt is a 63yo male with hx of severe restless leg syndrome evaluated this morning by Dr. Norlene Campbell, given pain medication in ED and discharged home with oxycodone and robaxin which have not been helping.  Pt has worsened throughout the day. Pain is constant, cramping, aching, 10/10. Nothing makes better or worse. Pain is so severe pt cannot get comfortable for more than 30 minutes at a time. Pt becomes diaphoretic with pain.  Wife states pt has only been seen by his PCP for his restless leg syndrome.  He was on mirapex which was not helping so PCP [name] started him on Zanaflex on 7/14, but also no relief.  Pain in both legs has only worsened.  Denies recent illness or injury.   Pt does have difficulty sleeping due to severe pain and restlessness in his legs.  Denies fever, n/v/d.   Past Medical History  Diagnosis Date  . Restless leg   . Cervicalgia   . Loss of weight   . Impotence of organic origin   . Umbilical hernia without mention of obstruction or gangrene   . Barrett's esophagus   . Unspecified gastritis and gastroduodenitis with hemorrhage   . Hyperlipidemia   . Other malaise and fatigue   . Hypoglycemia, unspecified   . GERD (gastroesophageal reflux disease)   . Complication of anesthesia     " i DO NOT WAKE UP VERY WELL "  . PONV (postoperative nausea and vomiting)   . Hypertension   . Heart murmur     "grew out of it" (12/15/2012)  . Chronic bronchitis     "not bad lately; used to get it 3-4 times/yr" (12/15/2012)  . IDDM (insulin dependent diabetes mellitus)   . H/O hiatal hernia   . Tension headache   . Headache     "weekly" (12/15/2012)  . COPD (chronic obstructive pulmonary disease)     Past Surgical History  Procedure Laterality Date  . Knee surgery Right 1992 and 2000    "calcium deposits removed" (12/15/2012)  . Coronary angioplasty with stent placement  12/15/2012    "2 stents" (12/15/2012)  . Hernia repair  2010    "umbilical" (12/15/2012)  . Incision and drainage abscess Left 05/2007    "groin" (12/15/2012)   History reviewed. No pertinent family history. History  Substance Use Topics  . Smoking status: Never Smoker   . Smokeless tobacco: Never Used  . Alcohol Use: No    Review of Systems  Constitutional: Negative for fever and chills.  Gastrointestinal: Negative for nausea and vomiting.  Musculoskeletal: Positive for myalgias ( severe bilateral leg pain and cramping). Negative for back pain.  Skin: Negative for color change and wound.  All other systems reviewed and are negative.     Allergies  Review of patient's allergies indicates no known allergies.  Home Medications   Prior to Admission medications   Medication Sig Start Date End Date Taking? Authorizing Provider  acetaminophen (TYLENOL) 500 MG tablet Take 1,000-1,500 mg by mouth every 6 (six) hours as needed for headache.   Yes Historical Provider, MD  aspirin 81 MG EC tablet Take 1 tablet (81 mg total) by mouth daily. 12/16/12  Yes Pamella Pert, MD  atorvastatin (LIPITOR)  20 MG tablet Take 20 mg by mouth every morning.    Yes Historical Provider, MD  carvedilol (COREG) 6.25 MG tablet Take 6.25 mg by mouth 2 (two) times daily with a meal.   Yes Historical Provider, MD  insulin glargine (LANTUS) 100 unit/mL SOPN Inject 20-26 Units into the skin 2 (two) times daily. 20 units in the morning and 26 units in the evening.   Yes Historical Provider, MD  methocarbamol (ROBAXIN-750) 750 MG tablet Take 1 tablet (750 mg total) by mouth 4 (four) times daily. 09/18/13  Yes Olivia Mackielga M Otter, MD  oxyCODONE-acetaminophen (PERCOCET/ROXICET) 5-325 MG per tablet Take 2 tablets by mouth every 4 (four) hours as  needed for severe pain. 09/18/13  Yes Olivia Mackielga M Otter, MD  pantoprazole (PROTONIX) 40 MG tablet Take 40 mg by mouth 2 (two) times daily.   Yes Historical Provider, MD  prasugrel (EFFIENT) 10 MG TABS tablet Take 1 tablet (10 mg total) by mouth daily. 12/16/12  Yes Pamella PertJagadeesh R Ganji, MD  tiZANidine (ZANAFLEX) 4 MG tablet Take 4 mg by mouth every 6 (six) hours as needed for muscle spasms.   Yes Historical Provider, MD  Fluticasone Furoate-Vilanterol (BREO ELLIPTA) 100-25 MCG/INH AEPB Inhale into the lungs once daily 09/14/13   Kimber RelicArthur G Green, MD  nitroGLYCERIN (NITROSTAT) 0.4 MG SL tablet Place 0.4 mg under the tongue every 5 (five) minutes as needed for chest pain.    Historical Provider, MD   BP 161/89  Pulse 89  Temp(Src) 98.4 F (36.9 C) (Oral)  Resp 22  SpO2 97% Physical Exam  Nursing note and vitals reviewed. Constitutional: He appears well-developed and well-nourished. He appears distressed.  Constantly moving in exam room, diaphoretic, appears uncomfortable. Cannot keep legs still.   HENT:  Head: Normocephalic and atraumatic.  Eyes: Conjunctivae are normal. No scleral icterus.  Neck: Normal range of motion.  Cardiovascular: Normal rate, regular rhythm and normal heart sounds.   Pulmonary/Chest: Effort normal and breath sounds normal. No respiratory distress. He has no wheezes. He has no rales. He exhibits no tenderness.  Abdominal: Soft. Bowel sounds are normal. He exhibits no distension. There is no tenderness.  Musculoskeletal: Normal range of motion. He exhibits tenderness.  FROM all both legs, tenderness in both legs, Visible muscle spasms in left lower leg.   Neurological: He is alert.  Skin: Skin is warm. He is diaphoretic. No erythema.    ED Course  Procedures (including critical care time) Labs Review Labs Reviewed - No data to display  Imaging Review No results found.   EKG Interpretation None      MDM   Final diagnoses:  None   Pt is a 63yo male with hx of  severe restless leg syndrome presenting to ED with c/o of worsening restless leg syndrome pain.  Pt seen this morning, given IV fluids, dilaudid, valium and robaxin with relief of pain.  Pt did have mildly elevated CK at that time but normal istat chem-8.  Due to severe discomfort, will give pt IV fluids, valium, diluadid, toradol and try gabapentin.  Pt resting comfortably in exam bed after pain medication given,  Will hold gabapentin until pt c/o pain again.   8:28 PM Pt signed out to Energy Transfer PartnersPeter Dammen at shift change. Plan is to f/u on CK that is in process.  Reassess pt's pain and discharge home with valium and consider gabapentin.  Pt should also be given neurology referral due to severity of symptoms.      Junius FinnerErin O'Malley,  PA-C 09/18/13 2030

## 2013-09-18 NOTE — ED Notes (Signed)
Pt up out of bed jiggling legs after obtaining complete relief for approx 25 minutes.  Reports its all beginning again.

## 2013-09-18 NOTE — ED Provider Notes (Signed)
CSN: [id number]     Arrival date & time 09/18/13  0103 History   First MD Initiated Contact with Patient 09/18/13 0441     Chief Complaint  Patient presents with  . Leg Pain     (Consider location/radiation/quality/duration/timing/severity/associated sxs/prior Treatment) HPI 63 year old male presents to emergency apartment with complaint of worsening restless leg syndrome.  Patient reports he was seen by his primary care doctor on Monday and change to a new medication, Zanaflex.  He reports the new medication has not helped.  Patient has had long-standing restless legs for many years.  Patient reports he is pending neurology evaluation. Past Medical History  Diagnosis Date  . Restless leg   . Cervicalgia   . Loss of weight   . Impotence of organic origin   . Umbilical hernia without mention of obstruction or gangrene   . Barrett's esophagus   . Unspecified gastritis and gastroduodenitis with hemorrhage   . Hyperlipidemia   . Other malaise and fatigue   . Hypoglycemia, unspecified   . GERD (gastroesophageal reflux disease)   . Complication of anesthesia     " i DO NOT WAKE UP VERY WELL "  . PONV (postoperative nausea and vomiting)   . Hypertension   . Heart murmur     "grew out of it" (12/15/2012)  . Chronic bronchitis     "not bad lately; used to get it 3-4 times/yr" (12/15/2012)  . IDDM (insulin dependent diabetes mellitus)   . H/O hiatal hernia   . Tension headache   . Headache     "weekly" (12/15/2012)  . COPD (chronic obstructive pulmonary disease)    Past Surgical History  Procedure Laterality Date  . Knee surgery Right 1992 and 2000    "calcium deposits removed" (12/15/2012)  . Coronary angioplasty with stent placement  12/15/2012    "2 stents" (12/15/2012)  . Hernia repair  2010    "umbilical" (12/15/2012)  . Incision and drainage abscess Left 05/2007    "groin" (12/15/2012)   No family history on file. History  Substance Use Topics  . Smoking status: Never  Smoker   . Smokeless tobacco: Never Used  . Alcohol Use: No    Review of Systems   See History of Present Illness; otherwise all other systems are reviewed and negative  Allergies  Review of patient's allergies indicates no known allergies.  Home Medications   Prior to Admission medications   Medication Sig Start Date End Date Taking? Authorizing Provider  acetaminophen (TYLENOL) 500 MG tablet Take 1,000-1,500 mg by mouth every 6 (six) hours as needed for headache.   Yes Historical Provider, MD  aspirin 81 MG EC tablet Take 1 tablet (81 mg total) by mouth daily. 12/16/12  Yes Pamella PertJagadeesh R Ganji, MD  atorvastatin (LIPITOR) 20 MG tablet Take 20 mg by mouth every morning.    Yes Historical Provider, MD  carvedilol (COREG) 6.25 MG tablet Take 6.25 mg by mouth 2 (two) times daily with a meal.   Yes Historical Provider, MD  Fluticasone Furoate-Vilanterol (BREO ELLIPTA) 100-25 MCG/INH AEPB Inhale into the lungs once daily 09/14/13  Yes Kimber RelicArthur G Green, MD  insulin glargine (LANTUS) 100 unit/mL SOPN Inject 20-26 Units into the skin 2 (two) times daily. 20 units in the morning and 26 units in the evening.   Yes Historical Provider, MD  pantoprazole (PROTONIX) 40 MG tablet Take 40 mg by mouth 2 (two) times daily.   Yes Historical Provider, MD  pramipexole (MIRAPEX) 0.75 MG tablet Take  0.375 mg by mouth 2 (two) times daily.  08/24/13  Yes Historical Provider, MD  prasugrel (EFFIENT) 10 MG TABS tablet Take 1 tablet (10 mg total) by mouth daily. 12/16/12  Yes Pamella Pert, MD  tiZANidine (ZANAFLEX) 4 MG tablet Take 4 mg by mouth every 8 (eight) hours as needed for muscle spasms.   Yes Historical Provider, MD  nitroGLYCERIN (NITROSTAT) 0.4 MG SL tablet Place 0.4 mg under the tongue every 5 (five) minutes as needed for chest pain.    Historical Provider, MD   BP 127/106  Pulse 77  Temp(Src) 97.4 F (36.3 C)  Resp 21  Ht 5\' 8"  (1.727 m)  Wt 185 lb (83.915 kg)  BMI 28.14 kg/m2  SpO2 93% Physical  Exam  Nursing note and vitals reviewed. Constitutional: He is oriented to person, place, and time. He appears well-developed and well-nourished. He appears distressed (uncomfortable appearing).  HENT:  Head: Normocephalic and atraumatic.  Nose: Nose normal.  Mouth/Throat: Oropharynx is clear and moist.  Eyes: Conjunctivae and EOM are normal. Pupils are equal, round, and reactive to light.  Neck: Normal range of motion. Neck supple. No JVD present. No tracheal deviation present. No thyromegaly present.  Cardiovascular: Normal rate, regular rhythm, normal heart sounds and intact distal pulses.  Exam reveals no gallop and no friction rub.   No murmur heard. Pulmonary/Chest: Effort normal and breath sounds normal. No stridor. No respiratory distress. He has no wheezes. He has no rales. He exhibits no tenderness.  Abdominal: Soft. Bowel sounds are normal. He exhibits no distension and no mass. There is no tenderness. There is no rebound and no guarding.  Musculoskeletal: Normal range of motion. He exhibits tenderness. He exhibits no edema.  Patient has intermittent jumping and cramping of the entire left leg.  He is tender through the left calf muscle without firmness, crepitus, pain out of proportion  Lymphadenopathy:    He has no cervical adenopathy.  Neurological: He is alert and oriented to person, place, and time. He has normal reflexes. No cranial nerve deficit. He exhibits normal muscle tone. Coordination (Involuntary spasms in left leg) abnormal.  Skin: Skin is warm and dry. No rash noted. No erythema. No pallor.  Psychiatric: He has a normal mood and affect. His behavior is normal. Judgment and thought content normal.    ED Course  Procedures (including critical care time) Labs Review Labs Reviewed  CK - Abnormal; Notable for the following:    Total CK 1765 (*)    All other components within normal limits  I-STAT CHEM 8, ED - Abnormal; Notable for the following:    BUN 26 (*)     Creatinine, Ser 1.40 (*)    Hemoglobin 12.9 (*)    HCT 38.0 (*)    All other components within normal limits  MAGNESIUM    Imaging Review No results found.   EKG Interpretation None      MDM   Final diagnoses:  Restless legs   62 year old male with restless leg syndrome no improvement with Zanaflex.  Will try Valium and Robaxin.  We'll check baseline labs.   6:56 AM Pt feeling better, still having some spasms.  Plan to d/c home, f/u with pcm.  Olivia Mackie, MD 09/18/13 [phone]

## 2013-09-18 NOTE — ED Notes (Signed)
Patient is in severe pain in his LT leg

## 2013-09-18 NOTE — ED Notes (Signed)
Pt reports having severe restless leg syndrome, more severe in right leg and causing pain. Pt was seen here last night for same.

## 2013-09-18 NOTE — Progress Notes (Signed)
Attempted to get report from ED. Transferred 2 times then no answer.

## 2013-09-18 NOTE — ED Provider Notes (Signed)
[name] S 8:00 PM patient discussed and signed out. Patient with long history of restless leg syndrome which has recently worsened. PCP started the patient on Zanaflex last Friday which has not been helping. The patient came early this morning and again today continued discomforts. Laboratory tests pending.  10:00 PM patient reevaluated. He is now having return of symptoms. He was able to sleep for one to 2 hours after receiving Dilaudid and Valium. Still continues to feel uncomfortable. He also has a significant elevation in his CK. Normal renal function testing otherwise. Will plan to consult hospitalist for admission.  10:42PM spoke with Dr. Allena KatzPatel with [facility]. He will see pt and admit. Would like tele bed.  Angus Sellereter S Hillarie Harrigan, PA-C 09/18/13 2246

## 2013-09-18 NOTE — ED Notes (Signed)
Gabapentin not given due to pt sleeping and difficult to stay awake and follow commands erin pa informed

## 2013-09-18 NOTE — H&P (Signed)
[facility] History and Physical  Patient: [name]  ZOX:[id number]  DOB: October 09, 1950  DOS: the patient was seen and examined on 09/18/2013 PCP: Kimber Relic, MD  Chief Complaint: Restless leg  HPI: [name] is a 63 y.o. male with Past medical history of restless leg syndrome, coronary artery disease status post PCI, dyslipidemia, diabetes mellitus, COPD. Patient presented with complaints of restless leg. He mentions that he has history of restless leg for last 20 years. Since last to 3 years he has been on Mirapex. There has been progressively worsening of his restless leg and they have been increasing his dose gradually. Recently he was taking his regular dose of Mirapex and then was having excessive drowsiness. His PCP stopped his Mirapex earlier this month and place him on Zanaflex. Since then he has progressive worsening of his restless leg the extent that he was having difficulty ambulating and therefore decided to come to the hospital. He denies any pain or muscle cramps. As per the wife he stops breathing when he is sleeping. He denies any active bleeding. He denies any other recent change in his medications.  The patient is coming from home. And at his baseline independent for most of his ADL.  Review of Systems: as mentioned in the history of present illness.  A Comprehensive review of the other systems is negative.  Past Medical History  Diagnosis Date  . Restless leg   . Cervicalgia   . Loss of weight   . Impotence of organic origin   . Umbilical hernia without mention of obstruction or gangrene   . Barrett's esophagus   . Unspecified gastritis and gastroduodenitis with hemorrhage   . Hyperlipidemia   . Other malaise and fatigue   . Hypoglycemia, unspecified   . GERD (gastroesophageal reflux disease)   . Complication of anesthesia     " i DO NOT WAKE UP VERY WELL "  . PONV (postoperative nausea and vomiting)   . Hypertension   . Heart murmur      "grew out of it" (12/15/2012)  . Chronic bronchitis     "not bad lately; used to get it 3-4 times/yr" (12/15/2012)  . IDDM (insulin dependent diabetes mellitus)   . H/O hiatal hernia   . Tension headache   . Headache     "weekly" (12/15/2012)  . COPD (chronic obstructive pulmonary disease)    Past Surgical History  Procedure Laterality Date  . Knee surgery Right 1992 and 2000    "calcium deposits removed" (12/15/2012)  . Coronary angioplasty with stent placement  12/15/2012    "2 stents" (12/15/2012)  . Hernia repair  2010    "umbilical" (12/15/2012)  . Incision and drainage abscess Left 05/2007    "groin" (12/15/2012)   Social History:  reports that he has never smoked. He has never used smokeless tobacco. He reports that he does not drink alcohol or use illicit drugs.  No Known Allergies  History reviewed. No pertinent family history.  Prior to Admission medications   Medication Sig Start Date End Date Taking? Authorizing Provider  acetaminophen (TYLENOL) 500 MG tablet Take 1,000-1,500 mg by mouth every 6 (six) hours as needed for headache.   Yes Historical Provider, MD  aspirin 81 MG EC tablet Take 1 tablet (81 mg total) by mouth daily. 12/16/12  Yes Pamella Pert, MD  atorvastatin (LIPITOR) 20 MG tablet Take 20 mg by mouth every morning.    Yes Historical Provider, MD  carvedilol (COREG) 6.25  MG tablet Take 6.25 mg by mouth 2 (two) times daily with a meal.   Yes Historical Provider, MD  insulin glargine (LANTUS) 100 unit/mL SOPN Inject 20-26 Units into the skin 2 (two) times daily. 20 units in the morning and 26 units in the evening.   Yes Historical Provider, MD  methocarbamol (ROBAXIN-750) 750 MG tablet Take 1 tablet (750 mg total) by mouth 4 (four) times daily. 09/18/13  Yes Olivia Mackie, MD  oxyCODONE-acetaminophen (PERCOCET/ROXICET) 5-325 MG per tablet Take 2 tablets by mouth every 4 (four) hours as needed for severe pain. 09/18/13  Yes Olivia Mackie, MD  pantoprazole  (PROTONIX) 40 MG tablet Take 40 mg by mouth 2 (two) times daily.   Yes Historical Provider, MD  prasugrel (EFFIENT) 10 MG TABS tablet Take 1 tablet (10 mg total) by mouth daily. 12/16/12  Yes Pamella Pert, MD  tiZANidine (ZANAFLEX) 4 MG tablet Take 4 mg by mouth every 6 (six) hours as needed for muscle spasms.   Yes Historical Provider, MD  Fluticasone Furoate-Vilanterol (BREO ELLIPTA) 100-25 MCG/INH AEPB Inhale into the lungs once daily 09/14/13   Kimber Relic, MD  nitroGLYCERIN (NITROSTAT) 0.4 MG SL tablet Place 0.4 mg under the tongue every 5 (five) minutes as needed for chest pain.    Historical Provider, MD    Physical Exam: Filed Vitals:   09/18/13 2242 09/18/13 2243 09/18/13 2252 09/18/13 2333  BP: 90/50  94/67 157/87  Pulse: 68  55 87  Temp: 98 F (36.7 C)   98 F (36.7 C)  TempSrc: Oral   Oral  Resp: 18  20 20   SpO2: 93% 93% 91% 93%    General: Alert, Awake and Oriented to Time, Place and Person. Appear in mild distress Eyes: PERRL ENT: Oral Mucosa clear moist. Neck: no JVD Cardiovascular: S1 and S2 Present, no Murmur, Peripheral Pulses Present Respiratory: Bilateral Air entry equal and Decreased, Clear to Auscultation, noCrackles, no wheezes Abdomen: Bowel Sound Present, Soft and Non tender Skin: no Rash Extremities: no Pedal edema, no calf tenderness Neurologic: Grossly no focal neuro deficit. Continuous jerking of his legs bilaterally.  Labs on Admission:  CBC:  Recent Labs Lab 09/18/13 0526 09/18/13 2002 09/18/13 2011  WBC  --  6.5  --   HGB 12.9* 11.4* 12.6*  HCT 38.0* 35.7* 37.0*  MCV  --  78.1  --   PLT  --  131*  --     CMP     Component Value Date/Time   NA 140 09/18/2013 2011   NA 140 08/26/2013 0810   K 3.9 09/18/2013 2011   CL 105 09/18/2013 2011   CO2 25 08/26/2013 0810   GLUCOSE 85 09/18/2013 2011   GLUCOSE 154* 08/26/2013 0810   BUN 20 09/18/2013 2011   BUN 17 08/26/2013 0810   CREATININE 1.20 09/18/2013 2011   CALCIUM 10.0 08/26/2013 0810    PROT 6.6 08/26/2013 0810   PROT 7.2 12/21/2012 0005   ALBUMIN 4.2 12/21/2012 0005   AST 19 08/26/2013 0810   ALT 24 08/26/2013 0810   ALKPHOS 58 08/26/2013 0810   BILITOT 0.3 08/26/2013 0810   GFRNONAA 68 08/26/2013 0810   GFRAA 78 08/26/2013 0810    No results found for this basename: LIPASE, AMYLASE,  in the last 168 hours No results found for this basename: AMMONIA,  in the last 168 hours   Recent Labs Lab 09/18/13 0522 09/18/13 2002  CKTOTAL 1765* 4907*   BNP (last 3 results)  No results found for this basename: PROBNP,  in the last 8760 hours  Radiological Exams on Admission: No results found.  Assessment/Plan Principal Problem:   Rhabdomyolysis Active Problems:   Type II or unspecified type diabetes mellitus with peripheral circulatory disorders, uncontrolled(250.72)   Hypertension   Restless legs syndrome (RLS)   Needs sleep apnea assessment   1. Rhabdomyolysis Patient is presenting with complaints of restless leg. He was found to have elevated CPK. His CPK coming from 1000-4000+ during his ED stay. With that the patient was admitted in the hospital. I will give him IV hydration and monitoring his CPK. Due to his elevation of rhabdomyolysis I would stop his Lipitor. He may require to discontinue Lipitor altogether due to his LDL being less than 70 and him having continuous progressively worsening restless leg. Be starting his Mirapex at a lower intensity dose. Also placing the patient on Valium for muscle relaxation. Nocturnal oximetry for sleep apnea evaluation. Patient will need a complete sleep study as an outpatient later on. Check iron and TSH level as well was restless leg.  2. Diabetes mellitus. Sliding scale and home insulin.  3. Hypertension. Patient was initially hypotensive after receiving pain medications. At present resuming his home medications with holding parameters  DVT Prophylaxis: subcutaneous Heparin Nutrition: Diabetic and cardiac  diet  Code Status: Full  Family Communication: Wife was present at bedside, opportunity was given to ask question and all questions were answered satisfactorily at the time of interview. Disposition: Admitted to inpatient in telemetry unit.  Author: Lynden OxfordPranav Martice Doty, MD [facility] Pager: [phone] 09/18/2013, 11:39 PM    If 7PM-7AM, please contact night-coverage [email or web address] Password [email or web address]  **Disclaimer: This note may have been dictated with voice recognition software. Similar sounding words can inadvertently be transcribed and this note may contain transcription errors which may not have been corrected upon publication of note.**

## 2013-09-18 NOTE — ED Notes (Signed)
The pt has restless legs and he has had a med change.   Worse since 1700 first his rt leg now his lt one.  painful

## 2013-09-19 ENCOUNTER — Encounter (HOSPITAL_COMMUNITY): Payer: Self-pay | Admitting: *Deleted

## 2013-09-19 LAB — CBC
HCT: 32.2 % — ABNORMAL LOW (ref 39.0–52.0)
Hemoglobin: 10.1 g/dL — ABNORMAL LOW (ref 13.0–17.0)
MCH: 25.3 pg — ABNORMAL LOW (ref 26.0–34.0)
MCHC: 31.4 g/dL (ref 30.0–36.0)
MCV: 80.7 fL (ref 78.0–100.0)
PLATELETS: 101 10*3/uL — AB (ref 150–400)
RBC: 3.99 MIL/uL — ABNORMAL LOW (ref 4.22–5.81)
RDW: 14.1 % (ref 11.5–15.5)
WBC: 3.8 10*3/uL — AB (ref 4.0–10.5)

## 2013-09-19 LAB — COMPREHENSIVE METABOLIC PANEL
ALT: 42 U/L (ref 0–53)
AST: 83 U/L — ABNORMAL HIGH (ref 0–37)
Albumin: 3.4 g/dL — ABNORMAL LOW (ref 3.5–5.2)
Alkaline Phosphatase: 50 U/L (ref 39–117)
Anion gap: 13 (ref 5–15)
BUN: 17 mg/dL (ref 6–23)
CALCIUM: 7.9 mg/dL — AB (ref 8.4–10.5)
CO2: 23 meq/L (ref 19–32)
Chloride: 106 mEq/L (ref 96–112)
Creatinine, Ser: 1.03 mg/dL (ref 0.50–1.35)
GFR, EST AFRICAN AMERICAN: 87 mL/min — AB (ref >90)
GFR, EST NON AFRICAN AMERICAN: 75 mL/min — AB (ref >90)
Glucose, Bld: 97 mg/dL (ref 70–99)
Potassium: 4.1 mEq/L (ref 3.7–5.3)
Sodium: 142 mEq/L (ref 137–147)
Total Bilirubin: 0.4 mg/dL (ref 0.3–1.2)
Total Protein: 6 g/dL (ref 6.0–8.3)

## 2013-09-19 LAB — GLUCOSE, CAPILLARY
Glucose-Capillary: 121 mg/dL — ABNORMAL HIGH (ref 70–99)
Glucose-Capillary: 148 mg/dL — ABNORMAL HIGH (ref 70–99)
Glucose-Capillary: 71 mg/dL (ref 70–99)
Glucose-Capillary: 71 mg/dL (ref 70–99)

## 2013-09-19 LAB — TSH: TSH: 4.81 u[IU]/mL — ABNORMAL HIGH (ref 0.350–4.500)

## 2013-09-19 LAB — PROTIME-INR
INR: 1.17 (ref 0.00–1.49)
PROTHROMBIN TIME: 14.9 s (ref 11.6–15.2)

## 2013-09-19 LAB — FERRITIN: FERRITIN: 13 ng/mL — AB (ref 22–322)

## 2013-09-19 LAB — IRON AND TIBC
Iron: 41 ug/dL — ABNORMAL LOW (ref 42–135)
SATURATION RATIOS: 14 % — AB (ref 20–55)
TIBC: 294 ug/dL (ref 215–435)
UIBC: 253 ug/dL (ref 125–400)

## 2013-09-19 LAB — CK: CK TOTAL: 3307 U/L — AB (ref 7–232)

## 2013-09-19 MED ORDER — CLONAZEPAM 0.5 MG PO TABS
0.5000 mg | ORAL_TABLET | Freq: Every evening | ORAL | Status: DC
  Administered 2013-09-19: 0.5 mg via ORAL
  Filled 2013-09-19: qty 1

## 2013-09-19 MED ORDER — METHOCARBAMOL 500 MG PO TABS
500.0000 mg | ORAL_TABLET | Freq: Four times a day (QID) | ORAL | Status: DC | PRN
  Administered 2013-09-19: 500 mg via ORAL
  Filled 2013-09-19 (×2): qty 1

## 2013-09-19 MED ORDER — HYDROMORPHONE HCL PF 1 MG/ML IJ SOLN
1.0000 mg | Freq: Once | INTRAMUSCULAR | Status: AC
  Administered 2013-09-20: 1 mg via INTRAVENOUS
  Filled 2013-09-19: qty 1

## 2013-09-19 MED ORDER — INSULIN ASPART 100 UNIT/ML ~~LOC~~ SOLN
0.0000 [IU] | Freq: Three times a day (TID) | SUBCUTANEOUS | Status: DC
  Administered 2013-09-19: 1 [IU] via SUBCUTANEOUS

## 2013-09-19 MED ORDER — GABAPENTIN 300 MG PO CAPS
300.0000 mg | ORAL_CAPSULE | Freq: Three times a day (TID) | ORAL | Status: DC
  Administered 2013-09-19 – 2013-09-20 (×3): 300 mg via ORAL
  Filled 2013-09-19 (×6): qty 1

## 2013-09-19 MED ORDER — KETOROLAC TROMETHAMINE 30 MG/ML IJ SOLN
30.0000 mg | Freq: Four times a day (QID) | INTRAMUSCULAR | Status: DC | PRN
  Administered 2013-09-19: 30 mg via INTRAVENOUS
  Filled 2013-09-19: qty 1

## 2013-09-19 MED ORDER — HYDRALAZINE HCL 20 MG/ML IJ SOLN: 10.0000 mg | INTRAMUSCULAR | Status: DC | PRN

## 2013-09-19 NOTE — Progress Notes (Deleted)
Pt. Family does not want foley cath. MD notified. Foley will be removed.

## 2013-09-19 NOTE — Progress Notes (Signed)
Pharmacy consult  Pharmacy consulted by Dr. Jerral RalphGhimire to evaluate the incidence of rhabdomyolysis of lipitor and mirapex.  Mirapex has been associated with <1 % of rhabdo per up-to-date and associated with rise in CPK.  Atorvastatin has <2% incidence of rhabdo.  The occurrence of rhabdomyolysis would most likely be attributed to lipitor vs. Mirapex, especially since the patient was not on mirapex PTA and received only one dose.    Kenneth ChristiansSamson Yavuz King, Pharm. D. Clinical Pharmacy Resident Pager: [phone] Ph: [phone] 09/19/2013 10:42 AM

## 2013-09-19 NOTE — ED Provider Notes (Signed)
Medical screening examination/treatment/procedure(s) were performed by non-physician practitioner and as supervising physician I was immediately available for consultation/collaboration.   EKG Interpretation None       Torin Whisner, MD 09/19/13 1458 

## 2013-09-19 NOTE — ED Provider Notes (Signed)
Medical screening examination/treatment/procedure(s) were performed by non-physician practitioner and as supervising physician I was immediately available for consultation/collaboration.   EKG Interpretation None       Jamyia Fortune R. Sem Mccaughey, MD 09/19/13 0706 

## 2013-09-19 NOTE — Progress Notes (Signed)
PATIENT DETAILS Name: [name] Age: 63 y.o. Sex: male Date of Birth: 1950/07/17 Admit Date: 09/18/2013 Admitting Physician Lynden Oxford, MD WUJ:[id number], Lenon Curt, MD  Subjective: Feels much better. Apparently has had worsening worsening RLS symptoms over the past 2 weeks.  Assessment/Plan: Principal Problem:   Rhabdomyolysis -suspected secondary to Statin therapy -continue to hold Statin, Mirapex can also cause rhabdo-but not on it prior to this admit (recently discontinued)-therefore will hold it as well -c/w IVF, recheck CK in am  Active Problems: Restless Leg Syndrome -hold Mirapex for now, ?worsening leg pain/spasm could be from rhabdo. Emphasize sleep hygiene. -try to manage Restless leg with Klonopin and Neurotin in the interim -check Fe panel    Type II or unspecified type diabetes mellitus with peripheral circulatory disorders, uncontrolled -CBG's stable -c/w Lantus, add SSI    Hypertension -controlled with Coreg  CAD -hx of PCI 12/15/12-currently stable without Chest pain -c/w ASA,Prasugel,Coreg,-given rhabdo-statin on hold  Disposition: Remain inpatient  DVT Prophylaxis: Prophylactic Heparin   Code Status: Full code  Family Communication Spouse at bedside  Procedures:  None  CONSULTS:  None  Time spent 40 minutes-which includes 50% of the time with face-to-face with patient/ family and coordinating care related to the above assessment and plan.    MEDICATIONS: Scheduled Meds: . aspirin  81 mg Oral Daily  . carvedilol  6.25 mg Oral BID WC  . clonazePAM  0.5 mg Oral QPM  . gabapentin  300 mg Oral TID  . heparin  5,000 Units Subcutaneous 3 times per day  . insulin glargine  20 Units Subcutaneous BID  . pantoprazole  40 mg Oral BID  . prasugrel  10 mg Oral Daily  . senna  1 tablet Oral BID  . sodium chloride  3 mL Intravenous Q12H   Continuous Infusions: . sodium chloride 125 mL/hr at 09/19/13 0721   PRN Meds:.acetaminophen,  acetaminophen, diazepam, hydrALAZINE, ondansetron (ZOFRAN) IV, ondansetron, oxyCODONE-acetaminophen  Antibiotics: Anti-infectives   None       PHYSICAL EXAM: Vital signs in last 24 hours: Filed Vitals:   09/18/13 2356 09/19/13 0145 09/19/13 0200 09/19/13 0542  BP: 187/79  128/80 130/80  Pulse: 72  59 62  Temp: 97.7 F (36.5 C)   97.5 F (36.4 C)  TempSrc: Oral   Oral  Resp: 20   18  Height: 5' 9.6" (1.768 m)     Weight:  86.41 kg (190 lb 8 oz)    SpO2: 93%  94% 95%    Weight change:  Filed Weights   09/19/13 0145  Weight: 86.41 kg (190 lb 8 oz)   Body mass index is 27.64 kg/(m^2).   Gen Exam: Awake and alert with clear speech.   Neck: Supple, No JVD.   Chest: B/L Clear.   CVS: S1 S2 Regular, no murmurs.  Abdomen: soft, BS +, non tender, non distended.  Extremities: no edema, lower extremities warm to touch. Neurologic: Non Focal.   Skin: No Rash.   Wounds: N/A.    Intake/Output from previous day:  Intake/Output Summary (Last 24 hours) at 09/19/13 0944 Last data filed at 09/19/13 0545  Gross per 24 hour  Intake 815.83 ml  Output      0 ml  Net 815.83 ml     LAB RESULTS: CBC  Recent Labs Lab 09/18/13 0526 09/18/13 2002 09/18/13 2011 09/19/13 0411  WBC  --  6.5  --  3.8*  HGB 12.9* 11.4* 12.6* 10.1*  HCT 38.0* 35.7* 37.0* 32.2*  PLT  --  131*  --  101*  MCV  --  78.1  --  80.7  MCH  --  24.9*  --  25.3*  MCHC  --  31.9  --  31.4  RDW  --  13.8  --  14.1    Chemistries   Recent Labs Lab 09/18/13 0522 09/18/13 0526 09/18/13 2011 09/19/13 0411  NA  --  142 140 142  K  --  3.8 3.9 4.1  CL  --  109 105 106  CO2  --   --   --  23  GLUCOSE  --  97 85 97  BUN  --  26* 20 17  CREATININE  --  1.40* 1.20 1.03  CALCIUM  --   --   --  7.9*  MG 1.9  --   --   --     CBG:  Recent Labs Lab 09/19/13 0022  GLUCAP 71    GFR Estimated Creatinine Clearance: 73.9 ml/min (by C-G formula based on Cr of 1.03).  Coagulation profile  Recent  Labs Lab 09/19/13 0411  INR 1.17    Cardiac Enzymes No results found for this basename: CK, CKMB, TROPONINI, MYOGLOBIN,  in the last 168 hours  No components found with this basename: POCBNP,  No results found for this basename: DDIMER,  in the last 72 hours No results found for this basename: HGBA1C,  in the last 72 hours No results found for this basename: CHOL, HDL, LDLCALC, TRIG, CHOLHDL, LDLDIRECT,  in the last 72 hours  Recent Labs  09/19/13 0411  TSH 4.810*   No results found for this basename: VITAMINB12, FOLATE, FERRITIN, TIBC, IRON, RETICCTPCT,  in the last 72 hours No results found for this basename: LIPASE, AMYLASE,  in the last 72 hours  Urine Studies No results found for this basename: UACOL, UAPR, USPG, UPH, UTP, UGL, UKET, UBIL, UHGB, UNIT, UROB, ULEU, UEPI, UWBC, URBC, UBAC, CAST, CRYS, UCOM, BILUA,  in the last 72 hours  MICROBIOLOGY: No results found for this or any previous visit (from the past 240 hour(s)).  RADIOLOGY STUDIES/RESULTS: No results found.  Jeoffrey MassedGHIMIRE,Maylin [place]burg, MD  [facility] Pager:[phone]  If 7PM-7AM, please contact night-coverage [email or web address] Password [email or web address] 09/19/2013, 9:44 AM   LOS: 1 day   **Disclaimer: This note may have been dictated with voice recognition software. Similar sounding words can inadvertently be transcribed and this note may contain transcription errors which may not have been corrected upon publication of note.**

## 2013-09-19 NOTE — Progress Notes (Signed)
Utilization Review Completed.[name] T7/19/2015  

## 2013-09-19 NOTE — Progress Notes (Signed)
Kenneth BolsterRalph K King [id number] Admitted to [id number]: 09/19/2013 12:27 AM Attending Provider: Lynden OxfordPranav Patel, MD    Kenneth BolsterRalph K King is a 63 y.o. male patient admitted from ED awake, alert  & orientated  X 3,  Full Code, VSS - Blood pressure 187/79, pulse 72, temperature 97.7 F (36.5 C), temperature source Oral, resp. rate 20, height 5' 9.6" (1.768 m), weight 0 kg (0 lb), SpO2 93.00%.RA, no c/o shortness of breath, no c/o chest pain, no distress noted. Tele # 07 placed and pt is currently running: NSR   IV site WDL:  with a transparent dsg that's clean dry and intact.  Allergies:  No Known Allergies   Past Medical History  Diagnosis Date  . Restless leg   . Cervicalgia   . Loss of weight   . Impotence of organic origin   . Umbilical hernia without mention of obstruction or gangrene   . Barrett's esophagus   . Unspecified gastritis and gastroduodenitis with hemorrhage   . Hyperlipidemia   . Other malaise and fatigue   . Hypoglycemia, unspecified   . GERD (gastroesophageal reflux disease)   . Complication of anesthesia     " i DO NOT WAKE UP VERY WELL "  . PONV (postoperative nausea and vomiting)   . Hypertension   . Heart murmur     "grew out of it" (12/15/2012)  . Chronic bronchitis     "not bad lately; used to get it 3-4 times/yr" (12/15/2012)  . IDDM (insulin dependent diabetes mellitus)   . H/O hiatal hernia   . Tension headache   . Headache     "weekly" (12/15/2012)  . COPD (chronic obstructive pulmonary disease)     History:  obtained from patient  Pt orientation to unit, room and routine. Information packet given to patient.  Admission INP armband ID verified with patient, and in place. SR up x 2, fall risk assessment complete with Patient and verbalizing understanding of risks associated with falls. Pt verbalizes an understanding of how to use the call bell and to call for help before getting out of bed.  Skin, clean-dry- intact without evidence of skin tears.   No evidence of skin  break down noted on exam.   Will cont to monitor and assist as needed.  Elisha PonderFaucette, Kaysan Peixoto Nicole, RN 09/19/2013 12:27 AM

## 2013-09-20 LAB — BASIC METABOLIC PANEL
Anion gap: 11 (ref 5–15)
BUN: 14 mg/dL (ref 6–23)
CALCIUM: 6.9 mg/dL — AB (ref 8.4–10.5)
CO2: 21 meq/L (ref 19–32)
CREATININE: 0.97 mg/dL (ref 0.50–1.35)
Chloride: 112 mEq/L (ref 96–112)
GFR calc Af Amer: >90 mL/min (ref >90)
GFR calc non Af Amer: 85 mL/min — ABNORMAL LOW (ref >90)
Glucose, Bld: 80 mg/dL (ref 70–99)
Potassium: 3.6 mEq/L — ABNORMAL LOW (ref 3.7–5.3)
Sodium: 144 mEq/L (ref 137–147)

## 2013-09-20 LAB — GLUCOSE, CAPILLARY
GLUCOSE-CAPILLARY: 85 mg/dL (ref 70–99)
Glucose-Capillary: 65 mg/dL — ABNORMAL LOW (ref 70–99)

## 2013-09-20 LAB — CK: Total CK: 1835 U/L — ABNORMAL HIGH (ref 7–232)

## 2013-09-20 MED ORDER — CARVEDILOL 6.25 MG PO TABS: 3.1250 mg | ORAL_TABLET | Freq: Two times a day (BID) | ORAL | Status: DC

## 2013-09-20 MED ORDER — CLONAZEPAM 0.5 MG PO TABS: 0.5000 mg | ORAL_TABLET | Freq: Two times a day (BID) | ORAL | Status: DC

## 2013-09-20 MED ORDER — GABAPENTIN 400 MG PO CAPS: 400.0000 mg | ORAL_CAPSULE | Freq: Three times a day (TID) | ORAL | Status: DC

## 2013-09-20 MED ORDER — HYDROMORPHONE HCL 2 MG PO TABS: 1.0000 mg | ORAL_TABLET | Freq: Three times a day (TID) | ORAL | Status: DC | PRN

## 2013-09-20 MED ORDER — FERROUS SULFATE 325 (65 FE) MG PO TABS: 325.0000 mg | ORAL_TABLET | Freq: Two times a day (BID) | ORAL | Status: DC

## 2013-09-20 MED ORDER — SENNA 8.6 MG PO TABS: 1.0000 | ORAL_TABLET | Freq: Every day | ORAL | Status: DC

## 2013-09-20 MED ORDER — FERROUS SULFATE 325 (65 FE) MG PO TABS
325.0000 mg | ORAL_TABLET | Freq: Two times a day (BID) | ORAL | Status: DC
  Administered 2013-09-20: 325 mg via ORAL
  Filled 2013-09-20 (×3): qty 1

## 2013-09-20 MED ORDER — CLONAZEPAM 0.5 MG PO TABS
0.5000 mg | ORAL_TABLET | Freq: Two times a day (BID) | ORAL | Status: DC
  Administered 2013-09-20: 0.5 mg via ORAL
  Filled 2013-09-20: qty 1

## 2013-09-20 MED ORDER — METHOCARBAMOL 750 MG PO TABS: 750.0000 mg | ORAL_TABLET | Freq: Four times a day (QID) | ORAL | Status: DC | PRN

## 2013-09-20 NOTE — Progress Notes (Signed)
MD notified that patient had two 2.5s pauses on tele, and is bradycardic in the 30s-50s.  He is in no apparent distress and is resting comfortably.  No new orders received.  Will continue to monitor patient.

## 2013-09-20 NOTE — Telephone Encounter (Signed)
Patient Notified and stated that he went to hospital over the weekend and they set him up for an appointment with neurologist and it is 09/29/2013. Patient had an appointment with you same day and had to cancel. Patient will call back to reschedule that appointment.

## 2013-09-20 NOTE — Discharge Summary (Signed)
PATIENT DETAILS Name: [name] Age: 63 y.o. Sex: male Date of Birth: [birth year] MRN: [id number]. Admit Date: 09/18/2013 Admitting Physician: Lynden Oxford, MD VOZ:DGUYQ, Lenon Curt, MD  Recommendations for Outpatient Follow-up:  1. Needs optimization of Restless Leg Syndrome Medications-given Rhabdo-Mirapex stopped. Started on Neurontin/Benzo's 2. Started on Iron Supplementation-needs to recheck Ferrtin and Fe levels. Please refer to Gastroenterology for colonoscopy-defer to PCP 3. Restart Mirapex and Statin at the discretion of PCP and Primary Cardiologist 4. Has nocturnal bradycardia-decreased Coreg to 3.125 mg BID  PRIMARY DISCHARGE DIAGNOSIS:  Principal Problem:   Rhabdomyolysis Active Problems:   Type II or unspecified type diabetes mellitus with peripheral circulatory disorders, uncontrolled(250.72)   Hypertension   Restless legs syndrome (RLS)   Needs sleep apnea assessment   Iron Deficiency Anemia      PAST MEDICAL HISTORY: Past Medical History  Diagnosis Date  . Restless leg   . Cervicalgia   . Loss of weight   . Impotence of organic origin   . Umbilical hernia without mention of obstruction or gangrene   . Barrett's esophagus   . Unspecified gastritis and gastroduodenitis with hemorrhage   . Hyperlipidemia   . Other malaise and fatigue   . Hypoglycemia, unspecified   . GERD (gastroesophageal reflux disease)   . Complication of anesthesia     " i DO NOT WAKE UP VERY WELL "  . PONV (postoperative nausea and vomiting)   . Hypertension   . Heart murmur     "grew out of it" (12/15/2012)  . Chronic bronchitis     "not bad lately; used to get it 3-4 times/yr" (12/15/2012)  . IDDM (insulin dependent diabetes mellitus)   . H/O hiatal hernia   . Tension headache   . Headache     "weekly" (12/15/2012)  . COPD (chronic obstructive pulmonary disease)     DISCHARGE MEDICATIONS:   Medication List    STOP taking these medications       atorvastatin 20 MG  tablet  Commonly known as:  LIPITOR     oxyCODONE-acetaminophen 5-325 MG per tablet  Commonly known as:  PERCOCET/ROXICET     tiZANidine 4 MG tablet  Commonly known as:  ZANAFLEX      TAKE these medications       acetaminophen 500 MG tablet  Commonly known as:  TYLENOL  Take 1,000-1,500 mg by mouth every 6 (six) hours as needed for headache.     aspirin 81 MG EC tablet  Take 1 tablet (81 mg total) by mouth daily.     carvedilol 6.25 MG tablet  Commonly known as:  COREG  Take 0.5 tablets (3.125 mg total) by mouth 2 (two) times daily with a meal.     clonazePAM 0.5 MG tablet  Commonly known as:  KLONOPIN  Take 1 tablet (0.5 mg total) by mouth 2 (two) times daily.     ferrous sulfate 325 (65 FE) MG tablet  Take 1 tablet (325 mg total) by mouth 2 (two) times daily with a meal.     Fluticasone Furoate-Vilanterol 100-25 MCG/INH Aepb  Commonly known as:  BREO ELLIPTA  Inhale into the lungs once daily     gabapentin 400 MG capsule  Commonly known as:  NEURONTIN  Take 1 capsule (400 mg total) by mouth 3 (three) times daily.     HYDROmorphone 2 MG tablet  Commonly known as:  DILAUDID  Take 0.5 tablets (1 mg total) by mouth every 8 (eight) hours as needed for  severe pain.     insulin glargine 100 unit/mL Sopn  Commonly known as:  LANTUS  Inject 20-26 Units into the skin 2 (two) times daily. 20 units in the morning and 26 units in the evening.     methocarbamol 750 MG tablet  Commonly known as:  ROBAXIN-750  Take 1 tablet (750 mg total) by mouth every 6 (six) hours as needed for muscle spasms.     nitroGLYCERIN 0.4 MG SL tablet  Commonly known as:  NITROSTAT  Place 0.4 mg under the tongue every 5 (five) minutes as needed for chest pain.     pantoprazole 40 MG tablet  Commonly known as:  PROTONIX  Take 40 mg by mouth 2 (two) times daily.     prasugrel 10 MG Tabs tablet  Commonly known as:  EFFIENT  Take 1 tablet (10 mg total) by mouth daily.     senna 8.6 MG Tabs  tablet  Commonly known as:  SENOKOT  Take 1 tablet (8.6 mg total) by mouth daily.        ALLERGIES:  No Known Allergies  BRIEF HPI:  See H&P, Labs, Consult and Test reports for all details in brief, patient is a 63 yo with past medical hx of RLS,coronary artery disease status post PCI, dyslipidemia, diabetes mellitus presented to the hospital complaining of severe leg cramps and neuropathy. Was found to have rhabdomyolysis. Was admitted for further evaluation and treatment  CONSULTATIONS:   neurology  PERTINENT RADIOLOGIC STUDIES: No results found.   PERTINENT LAB RESULTS: CBC:  Recent Labs  09/18/13 2002 09/18/13 2011 09/19/13 0411  WBC 6.5  --  3.8*  HGB 11.4* 12.6* 10.1*  HCT 35.7* 37.0* 32.2*  PLT 131*  --  101*   CMET CMP     Component Value Date/Time   NA 144 09/20/2013 0508   NA 140 08/26/2013 0810   K 3.6* 09/20/2013 0508   CL 112 09/20/2013 0508   CO2 21 09/20/2013 0508   GLUCOSE 80 09/20/2013 0508   GLUCOSE 154* 08/26/2013 0810   BUN 14 09/20/2013 0508   BUN 17 08/26/2013 0810   CREATININE 0.97 09/20/2013 0508   CALCIUM 6.9* 09/20/2013 0508   PROT 6.0 09/19/2013 0411   PROT 6.6 08/26/2013 0810   ALBUMIN 3.4* 09/19/2013 0411   AST 83* 09/19/2013 0411   ALT 42 09/19/2013 0411   ALKPHOS 50 09/19/2013 0411   BILITOT 0.4 09/19/2013 0411   GFRNONAA 85* 09/20/2013 0508   GFRAA >90 09/20/2013 0508    GFR Estimated Creatinine Clearance: 78.5 ml/min (by C-G formula based on Cr of 0.97). No results found for this basename: LIPASE, AMYLASE,  in the last 72 hours  Recent Labs  09/18/13 2002 09/19/13 0411 09/20/13 0508  CKTOTAL 4907* 3307* 1835*   No components found with this basename: POCBNP,  No results found for this basename: DDIMER,  in the last 72 hours No results found for this basename: HGBA1C,  in the last 72 hours No results found for this basename: CHOL, HDL, LDLCALC, TRIG, CHOLHDL, LDLDIRECT,  in the last 72 hours  Recent Labs  09/19/13 0411  TSH  4.810*    Recent Labs  09/19/13 0411  FERRITIN 13*  TIBC 294  IRON 41*   Coags:  Recent Labs  09/19/13 0411  INR 1.17   Microbiology: No results found for this or any previous visit (from the past 240 hour(s)).   BRIEF HOSPITAL COURSE:  Restless Leg Syndrome  Rhabdomyolysis  -suspected secondary  to Statin therapy. Patient was admitted, statin was held, and started on IV fluids. -continue to hold Statin on discharge, Mirapex can also cause rhabdo-but not on it prior to this admit (recently discontinued)-therefore will continue hold it as well  - Patient improved, by day of discharge, CK down to 1835, from a peak of 4907. Since significantly better, stable for discharge today  Restless leg syndrome -hold Mirapex for now, ?worsening leg pain/spasm could be from rhabdo, and worsening iron deficiency anemia (ferritin level at 13) Emphasize sleep hygiene.  - For now try to manage Restless leg with Klonopin and Neurotin in the interim, have made an appointment with neurology on 7/29 for further continued care. - I have started patient on iron supplementation, he may need referral to gastroenterology for a repeat endoscopy, would defer this to his primary care practitioner  Type II or unspecified type diabetes mellitus with peripheral circulatory disorders, uncontrolled  -CBG's stable  -c/w Lantus  Nocturnal bradycardia - During this hospitalization, patient was monitored on telemetry, had sinus bradycardia and 2 pauses of 2.5 seconds. Case was discussed with primary cardiologist Dr. Jacinto HalimGanji over the phone, who recommended that patient's dose of Coreg be reduced to 3.125 mg. Patient will followup with Dr. Jacinto HalimGanji for further workup.  Hypertension  -controlled with Coreg- dose decreased to 3.125 mg twice a day as patient had nocturnal bradycardia and sinus pauses of 2.5 seconds.  CAD  -hx of PCI 12/15/12-currently stable without Chest pain  -c/w ASA,Prasugel,Coreg,-given rhabdo-statin  on hold at the time of discharge. Please resume at the discretion of primary care practitioner and primary cardiologist  TODAY-DAY OF DISCHARGE:  Subjective:   [name] today has no headache,no chest abdominal pain- patient in fact slept well last night. Symptoms seem to have improved somewhat  Objective:   Blood pressure 125/72, pulse 69, temperature 97.6 F (36.4 C), temperature source Oral, resp. rate 18, height 5' 9.6" (1.768 m), weight 86.32 kg (190 lb 4.8 oz), SpO2 96.00%.  Intake/Output Summary (Last 24 hours) at 09/20/13 0945 Last data filed at 09/19/13 2315  Gross per 24 hour  Intake 2020.83 ml  Output      0 ml  Net 2020.83 ml   Filed Weights   09/19/13 0145 09/20/13 0529  Weight: 86.41 kg (190 lb 8 oz) 86.32 kg (190 lb 4.8 oz)    Exam Awake Alert, Oriented *3, No new F.N deficits, Normal affect [place].AT,PERRAL Supple Neck,No JVD, No cervical lymphadenopathy appriciated.  Symmetrical Chest wall movement, Good air movement bilaterally, CTAB RRR,No Gallops,Rubs or new Murmurs, No Parasternal Heave +ve B.Sounds, Abd Soft, Non tender, No organomegaly appriciated, No rebound -guarding or rigidity. No Cyanosis, Clubbing or edema, No new Rash or bruise  DISCHARGE CONDITION: Stable  DISPOSITION: Home  DISCHARGE INSTRUCTIONS:    Activity:  As tolerated  Diet recommendation: Diabetic Diet Heart Healthy diet  Discharge Instructions   Call MD for:  severe uncontrolled pain    Complete by:  As directed      Diet - low sodium heart healthy    Complete by:  As directed      Diet Carb Modified    Complete by:  As directed      Increase activity slowly    Complete by:  As directed            Follow-up Information   Follow up with Van ClinesAquino,Karen M, MD On 09/29/2013. (appt at 2:30 pm-please get there 15 minutes early)    Specialty:  Neurology  Contact information:   [address] [place] [address] [phone]       Follow up with GREEN,  Lenon Curt, MD. Schedule an appointment as soon as possible for a visit in 1 week.   Specialty:  Internal Medicine   Contact information:   [address] [place] [address] [phone]       Follow up with Pamella Pert, MD. Schedule an appointment as soon as possible for a visit in 1 week.   Specialty:  Cardiology   Contact information:   [address] [place] [address] [phone]         Total Time spent on discharge equals 45 minutes.  SignedJeoffrey Massed 09/20/2013 9:45 AM  **Disclaimer: This note may have been dictated with voice recognition software. Similar sounding words can inadvertently be transcribed and this note may contain transcription errors which may not have been corrected upon publication of note.**

## 2013-09-20 NOTE — Progress Notes (Signed)
Nsg Discharge Note  Admit Date:  09/18/2013 Discharge date: 09/20/2013   [name] to be D/C'd Home per MD order.  AVS completed.  Copy for chart, and copy for patient signed, and dated. Patient/caregiver able to verbalize understanding.  Discharge Medication:   Medication List    STOP taking these medications       atorvastatin 20 MG tablet  Commonly known as:  LIPITOR     oxyCODONE-acetaminophen 5-325 MG per tablet  Commonly known as:  PERCOCET/ROXICET     tiZANidine 4 MG tablet  Commonly known as:  ZANAFLEX      TAKE these medications       acetaminophen 500 MG tablet  Commonly known as:  TYLENOL  Take 1,000-1,500 mg by mouth every 6 (six) hours as needed for headache.     aspirin 81 MG EC tablet  Take 1 tablet (81 mg total) by mouth daily.     carvedilol 6.25 MG tablet  Commonly known as:  COREG  Take 0.5 tablets (3.125 mg total) by mouth 2 (two) times daily with a meal.     clonazePAM 0.5 MG tablet  Commonly known as:  KLONOPIN  Take 1 tablet (0.5 mg total) by mouth 2 (two) times daily.     ferrous sulfate 325 (65 FE) MG tablet  Take 1 tablet (325 mg total) by mouth 2 (two) times daily with a meal.     Fluticasone Furoate-Vilanterol 100-25 MCG/INH Aepb  Commonly known as:  BREO ELLIPTA  Inhale into the lungs once daily     gabapentin 400 MG capsule  Commonly known as:  NEURONTIN  Take 1 capsule (400 mg total) by mouth 3 (three) times daily.     HYDROmorphone 2 MG tablet  Commonly known as:  DILAUDID  Take 0.5 tablets (1 mg total) by mouth every 8 (eight) hours as needed for severe pain.     insulin glargine 100 unit/mL Sopn  Commonly known as:  LANTUS  Inject 20-26 Units into the skin 2 (two) times daily. 20 units in the morning and 26 units in the evening.     methocarbamol 750 MG tablet  Commonly known as:  ROBAXIN-750  Take 1 tablet (750 mg total) by mouth every 6 (six) hours as needed for muscle spasms.     nitroGLYCERIN 0.4 MG SL tablet   Commonly known as:  NITROSTAT  Place 0.4 mg under the tongue every 5 (five) minutes as needed for chest pain.     pantoprazole 40 MG tablet  Commonly known as:  PROTONIX  Take 40 mg by mouth 2 (two) times daily.     prasugrel 10 MG Tabs tablet  Commonly known as:  EFFIENT  Take 1 tablet (10 mg total) by mouth daily.     senna 8.6 MG Tabs tablet  Commonly known as:  SENOKOT  Take 1 tablet (8.6 mg total) by mouth daily.        Discharge Assessment: Filed Vitals:   09/20/13 0529  BP: 125/72  Pulse: 69  Temp: 97.6 F (36.4 C)  Resp: 18   Skin clean, dry and intact without evidence of skin break down, no evidence of skin tears noted. IV catheter discontinued intact. Site without signs and symptoms of complications - no redness or edema noted at insertion site, patient denies c/o pain - only slight tenderness at site.  Dressing with slight pressure applied.  D/c Instructions-Education: Discharge instructions given to patient/family with verbalized understanding. D/c education completed with patient/family  including follow up instructions, medication list, d/c activities limitations if indicated, with other d/c instructions as indicated by MD - patient able to verbalize understanding, all questions fully answered. Patient instructed to return to ED, call 911, or call MD for any changes in condition.  Patient escorted via WC, and D/C home via private auto.  Kern ReapBrumagin, Adom Schoeneck L, RN 09/20/2013 10:47 AM

## 2013-09-20 NOTE — Progress Notes (Signed)
Hypoglycemic Event  CBG: 65   Treatment: 15 GM carbohydrate snack  Symptoms: None  Follow-up CBG: Time 0835  CBG Result:85  Possible Reasons for Event: Unknown  Comments/MD notified:YES    Stanely Sexson L  Remember to initiate Hypoglycemia Order Set & complete

## 2013-09-21 ENCOUNTER — Emergency Department (HOSPITAL_COMMUNITY)
Admission: EM | Admit: 2013-09-21 | Discharge: 2013-09-21 | Disposition: A | Discharge status: Home / Self Care | Payer: BC Managed Care – PPO | Attending: Emergency Medicine | Admitting: Emergency Medicine

## 2013-09-21 ENCOUNTER — Encounter (HOSPITAL_COMMUNITY): Payer: Self-pay | Admitting: Emergency Medicine

## 2013-09-21 DIAGNOSIS — E1159 Type 2 diabetes mellitus with other circulatory complications: Secondary | ICD-10-CM

## 2013-09-21 DIAGNOSIS — E1169 Type 2 diabetes mellitus with other specified complication: Secondary | ICD-10-CM | POA: Insufficient documentation

## 2013-09-21 DIAGNOSIS — IMO0002 Reserved for concepts with insufficient information to code with codable children: Secondary | ICD-10-CM | POA: Insufficient documentation

## 2013-09-21 DIAGNOSIS — Z0189 Encounter for other specified special examinations: Secondary | ICD-10-CM

## 2013-09-21 DIAGNOSIS — I200 Unstable angina: Secondary | ICD-10-CM

## 2013-09-21 DIAGNOSIS — R39198 Other difficulties with micturition: Secondary | ICD-10-CM

## 2013-09-21 DIAGNOSIS — M542 Cervicalgia: Secondary | ICD-10-CM

## 2013-09-21 DIAGNOSIS — E1165 Type 2 diabetes mellitus with hyperglycemia: Secondary | ICD-10-CM

## 2013-09-21 DIAGNOSIS — M6282 Rhabdomyolysis: Secondary | ICD-10-CM

## 2013-09-21 DIAGNOSIS — R634 Abnormal weight loss: Secondary | ICD-10-CM

## 2013-09-21 DIAGNOSIS — J449 Chronic obstructive pulmonary disease, unspecified: Secondary | ICD-10-CM | POA: Insufficient documentation

## 2013-09-21 DIAGNOSIS — Z794 Long term (current) use of insulin: Secondary | ICD-10-CM | POA: Insufficient documentation

## 2013-09-21 DIAGNOSIS — R5383 Other fatigue: Secondary | ICD-10-CM

## 2013-09-21 DIAGNOSIS — IMO0001 Reserved for inherently not codable concepts without codable children: Secondary | ICD-10-CM

## 2013-09-21 DIAGNOSIS — R0602 Shortness of breath: Secondary | ICD-10-CM

## 2013-09-21 DIAGNOSIS — R1013 Epigastric pain: Secondary | ICD-10-CM

## 2013-09-21 DIAGNOSIS — R748 Abnormal levels of other serum enzymes: Secondary | ICD-10-CM

## 2013-09-21 DIAGNOSIS — K429 Umbilical hernia without obstruction or gangrene: Secondary | ICD-10-CM

## 2013-09-21 DIAGNOSIS — I10 Essential (primary) hypertension: Secondary | ICD-10-CM

## 2013-09-21 DIAGNOSIS — G2581 Restless legs syndrome: Secondary | ICD-10-CM

## 2013-09-21 DIAGNOSIS — Z79899 Other long term (current) drug therapy: Secondary | ICD-10-CM | POA: Insufficient documentation

## 2013-09-21 DIAGNOSIS — E785 Hyperlipidemia, unspecified: Secondary | ICD-10-CM

## 2013-09-21 DIAGNOSIS — G44209 Tension-type headache, unspecified, not intractable: Secondary | ICD-10-CM

## 2013-09-21 DIAGNOSIS — M79606 Pain in leg, unspecified: Secondary | ICD-10-CM

## 2013-09-21 DIAGNOSIS — M791 Myalgia, unspecified site: Secondary | ICD-10-CM

## 2013-09-21 DIAGNOSIS — K219 Gastro-esophageal reflux disease without esophagitis: Secondary | ICD-10-CM | POA: Insufficient documentation

## 2013-09-21 DIAGNOSIS — R5381 Other malaise: Secondary | ICD-10-CM

## 2013-09-21 DIAGNOSIS — Z7982 Long term (current) use of aspirin: Secondary | ICD-10-CM | POA: Insufficient documentation

## 2013-09-21 DIAGNOSIS — N529 Male erectile dysfunction, unspecified: Secondary | ICD-10-CM

## 2013-09-21 DIAGNOSIS — E162 Hypoglycemia, unspecified: Secondary | ICD-10-CM

## 2013-09-21 DIAGNOSIS — J4489 Other specified chronic obstructive pulmonary disease: Secondary | ICD-10-CM | POA: Insufficient documentation

## 2013-09-21 DIAGNOSIS — K2270 Barrett's esophagus without dysplasia: Secondary | ICD-10-CM

## 2013-09-21 DIAGNOSIS — M7660 Achilles tendinitis, unspecified leg: Secondary | ICD-10-CM

## 2013-09-21 DIAGNOSIS — K2971 Gastritis, unspecified, with bleeding: Secondary | ICD-10-CM

## 2013-09-21 DIAGNOSIS — M79609 Pain in unspecified limb: Secondary | ICD-10-CM | POA: Insufficient documentation

## 2013-09-21 DIAGNOSIS — R011 Cardiac murmur, unspecified: Secondary | ICD-10-CM | POA: Insufficient documentation

## 2013-09-21 DIAGNOSIS — K2991 Gastroduodenitis, unspecified, with bleeding: Secondary | ICD-10-CM

## 2013-09-21 LAB — COMPREHENSIVE METABOLIC PANEL
ALK PHOS: 54 U/L (ref 39–117)
ALT: 49 U/L (ref 0–53)
AST: 73 U/L — AB (ref 0–37)
Albumin: 3.8 g/dL (ref 3.5–5.2)
Anion gap: 13 (ref 5–15)
BILIRUBIN TOTAL: 0.3 mg/dL (ref 0.3–1.2)
BUN: 15 mg/dL (ref 6–23)
CHLORIDE: 107 meq/L (ref 96–112)
CO2: 22 meq/L (ref 19–32)
Calcium: 9 mg/dL (ref 8.4–10.5)
Creatinine, Ser: 1.01 mg/dL (ref 0.50–1.35)
GFR calc Af Amer: 89 mL/min — ABNORMAL LOW (ref >90)
GFR calc non Af Amer: 77 mL/min — ABNORMAL LOW (ref >90)
Glucose, Bld: 62 mg/dL — ABNORMAL LOW (ref 70–99)
POTASSIUM: 3.2 meq/L — AB (ref 3.7–5.3)
Sodium: 142 mEq/L (ref 137–147)
Total Protein: 6.8 g/dL (ref 6.0–8.3)

## 2013-09-21 LAB — URINALYSIS, ROUTINE W REFLEX MICROSCOPIC
Bilirubin Urine: NEGATIVE
Glucose, UA: NEGATIVE mg/dL
HGB URINE DIPSTICK: NEGATIVE
Ketones, ur: NEGATIVE mg/dL
Leukocytes, UA: NEGATIVE
NITRITE: NEGATIVE
Protein, ur: NEGATIVE mg/dL
SPECIFIC GRAVITY, URINE: 1.014 (ref 1.005–1.030)
Urobilinogen, UA: 0.2 mg/dL (ref 0.0–1.0)
pH: 6 (ref 5.0–8.0)

## 2013-09-21 LAB — I-STAT TROPONIN, ED: Troponin i, poc: 0.02 ng/mL (ref 0.00–0.08)

## 2013-09-21 LAB — CBC
HEMATOCRIT: 32.6 % — AB (ref 39.0–52.0)
HEMOGLOBIN: 10.6 g/dL — AB (ref 13.0–17.0)
MCH: 25.7 pg — ABNORMAL LOW (ref 26.0–34.0)
MCHC: 32.5 g/dL (ref 30.0–36.0)
MCV: 79.1 fL (ref 78.0–100.0)
PLATELETS: 117 10*3/uL — AB (ref 150–400)
RBC: 4.12 MIL/uL — AB (ref 4.22–5.81)
RDW: 14 % (ref 11.5–15.5)
WBC: 4.6 10*3/uL (ref 4.0–10.5)

## 2013-09-21 LAB — MAGNESIUM: Magnesium: 2 mg/dL (ref 1.5–2.5)

## 2013-09-21 LAB — CBG MONITORING, ED
Glucose-Capillary: 60 mg/dL — ABNORMAL LOW (ref 70–99)
Glucose-Capillary: 110 mg/dL — ABNORMAL HIGH (ref 70–99)

## 2013-09-21 LAB — PHOSPHORUS: Phosphorus: 3.2 mg/dL (ref 2.3–4.6)

## 2013-09-21 LAB — CK: Total CK: 2221 U/L — ABNORMAL HIGH (ref 7–232)

## 2013-09-21 MED ORDER — GABAPENTIN ENACARBIL ER 300 MG PO TBCR: 300.0000 mg | EXTENDED_RELEASE_TABLET | Freq: Every day | ORAL | Status: DC

## 2013-09-21 MED ORDER — SODIUM CHLORIDE 0.9 % IV BOLUS (SEPSIS)
1000.0000 mL | Freq: Once | INTRAVENOUS | Status: AC
  Administered 2013-09-21: 1000 mL via INTRAVENOUS

## 2013-09-21 MED ORDER — DEXTROSE 50 % IV SOLN
25.0000 mL | Freq: Once | INTRAVENOUS | Status: AC
  Administered 2013-09-21: 25 mL via INTRAVENOUS
  Filled 2013-09-21: qty 50

## 2013-09-21 MED ORDER — FENTANYL CITRATE 0.05 MG/ML IJ SOLN
50.0000 ug | Freq: Once | INTRAMUSCULAR | Status: AC
  Administered 2013-09-21: 50 ug via INTRAMUSCULAR

## 2013-09-21 MED ORDER — HYDROMORPHONE HCL 2 MG PO TABS: 2.0000 mg | ORAL_TABLET | Freq: Four times a day (QID) | ORAL | Status: DC | PRN

## 2013-09-21 MED ORDER — FENTANYL CITRATE 0.05 MG/ML IJ SOLN
50.0000 ug | Freq: Once | INTRAMUSCULAR | Status: DC
  Filled 2013-09-21: qty 2

## 2013-09-21 MED ORDER — ASPIRIN 81 MG PO CHEW
324.0000 mg | CHEWABLE_TABLET | Freq: Once | ORAL | Status: AC
  Administered 2013-09-21: 324 mg via ORAL
  Filled 2013-09-21: qty 4

## 2013-09-21 MED ORDER — HYDROMORPHONE HCL PF 1 MG/ML IJ SOLN
2.0000 mg | Freq: Once | INTRAMUSCULAR | Status: AC
  Administered 2013-09-21: 2 mg via INTRAVENOUS
  Filled 2013-09-21: qty 2

## 2013-09-21 NOTE — ED Notes (Signed)
Reviewed discharge instructions with pt. And pt.s wife .Marland Kitchen.She verbalized understanding.  Reviewed the medication changes also with the pt.s wife.  She verbalized understanding.

## 2013-09-21 NOTE — ED Notes (Signed)
Pt has restless leg. Pt was seen here Friday and admitted Saturday for pain control related to the restless leg. Pt states that he also sometimes has cramping in his arms. Pt given home medications including dilaudid PO.

## 2013-09-21 NOTE — ED Provider Notes (Signed)
CSN: [id number]     Arrival date & time 09/21/13  0050 History   First MD Initiated Contact with Patient 09/21/13 0405     Chief Complaint  Patient presents with  . Leg Pain     (Consider location/radiation/quality/duration/timing/severity/associated sxs/prior Treatment) HPI  This 63 yo man with multiple chronic medical problems including CAD and recent admission for pain related to restless leg syndrome and rhabdomyolysis. He was discharged < 24 hrs ago with script for Dilaudid 2mg  tablets to take in 1/2 increments.   He comes in with persistently restless and painful legs bilaterally. When I entered the patient's room he was, quite literally, thrashing about the bed. He was able to answer yes or no questions but, his wife provided most of the history because of his distress.   She says that she has been giving patient medications, as prescribed. However, he has had persistent restless legs and associated leg pain. While he was waiting in the ED to be seen, he also developed diffuse chest pain.   The patient is unable to rate or describe his pain. Wife says that patient has a 20 year history of restless leg syndrome but, it only began to cause pain in the past couple of weeks.  When he was admitted this past weekend, he was noted to have a CK in the 4000s.   Past Medical History  Diagnosis Date  . Restless leg   . Cervicalgia   . Loss of weight   . Impotence of organic origin   . Umbilical hernia without mention of obstruction or gangrene   . Barrett's esophagus   . Unspecified gastritis and gastroduodenitis with hemorrhage   . Hyperlipidemia   . Other malaise and fatigue   . Hypoglycemia, unspecified   . GERD (gastroesophageal reflux disease)   . Complication of anesthesia     " i DO NOT WAKE UP VERY WELL "  . PONV (postoperative nausea and vomiting)   . Hypertension   . Heart murmur     "grew out of it" (12/15/2012)  . Chronic bronchitis     "not bad lately; used to get it  3-4 times/yr" (12/15/2012)  . IDDM (insulin dependent diabetes mellitus)   . H/O hiatal hernia   . Tension headache   . Headache     "weekly" (12/15/2012)  . COPD (chronic obstructive pulmonary disease)    Past Surgical History  Procedure Laterality Date  . Knee surgery Right 1992 and 2000    "calcium deposits removed" (12/15/2012)  . Coronary angioplasty with stent placement  12/15/2012    "2 stents" (12/15/2012)  . Hernia repair  2010    "umbilical" (12/15/2012)  . Incision and drainage abscess Left 05/2007    "groin" (12/15/2012)   History reviewed. No pertinent family history. History  Substance Use Topics  . Smoking status: Never Smoker   . Smokeless tobacco: Never Used  . Alcohol Use: No    Review of Systems  Ten point review of symptoms performed and is negative with the exception of symptoms noted above.   Allergies  Review of patient's allergies indicates no known allergies.  Home Medications   Prior to Admission medications   Medication Sig Start Date End Date Taking? Authorizing Provider  acetaminophen (TYLENOL) 500 MG tablet Take 1,000-1,500 mg by mouth every 6 (six) hours as needed for headache.    Historical Provider, MD  aspirin 81 MG EC tablet Take 1 tablet (81 mg total) by mouth daily. 12/16/12  Pamella Pert, MD  carvedilol (COREG) 6.25 MG tablet Take 0.5 tablets (3.125 mg total) by mouth 2 (two) times daily with a meal. 09/20/13   Shanker Levora Dredge, MD  clonazePAM (KLONOPIN) 0.5 MG tablet Take 1 tablet (0.5 mg total) by mouth 2 (two) times daily. 09/20/13   Shanker Levora Dredge, MD  ferrous sulfate 325 (65 FE) MG tablet Take 1 tablet (325 mg total) by mouth 2 (two) times daily with a meal. 09/20/13   Shanker Levora Dredge, MD  Fluticasone Furoate-Vilanterol (BREO ELLIPTA) 100-25 MCG/INH AEPB Inhale into the lungs once daily 09/14/13   Kimber Relic, MD  gabapentin (NEURONTIN) 400 MG capsule Take 1 capsule (400 mg total) by mouth 3 (three) times daily. 09/20/13    Shanker Levora Dredge, MD  HYDROmorphone (DILAUDID) 2 MG tablet Take 0.5 tablets (1 mg total) by mouth every 8 (eight) hours as needed for severe pain. 09/20/13   Shanker Levora Dredge, MD  insulin glargine (LANTUS) 100 unit/mL SOPN Inject 20-26 Units into the skin 2 (two) times daily. 20 units in the morning and 26 units in the evening.    Historical Provider, MD  methocarbamol (ROBAXIN-750) 750 MG tablet Take 1 tablet (750 mg total) by mouth every 6 (six) hours as needed for muscle spasms. 09/20/13   Shanker Levora Dredge, MD  nitroGLYCERIN (NITROSTAT) 0.4 MG SL tablet Place 0.4 mg under the tongue every 5 (five) minutes as needed for chest pain.    Historical Provider, MD  pantoprazole (PROTONIX) 40 MG tablet Take 40 mg by mouth 2 (two) times daily.    Historical Provider, MD  prasugrel (EFFIENT) 10 MG TABS tablet Take 1 tablet (10 mg total) by mouth daily. 12/16/12   Pamella Pert, MD  senna (SENOKOT) 8.6 MG TABS tablet Take 1 tablet (8.6 mg total) by mouth daily. 09/20/13   Shanker Levora Dredge, MD   BP 163/91  Pulse 52  Temp(Src) 97.6 F (36.4 C) (Oral)  Resp 10  Ht 5\' 8"  (1.727 m)  Wt 185 lb (83.915 kg)  BMI 28.14 kg/m2  SpO2 91% Physical Exam  Gen: well developed and well nourished appearing, appears to be in severe distress with thrashing of the legs bilaterally Head: NCAT Eyes: PERL, EOMI Nose: no epistaixis or rhinorrhea Mouth/throat: mucosa is moist and pink Neck: supple, no stridor Lungs: CTA B, no wheezing, rhonchi or rales CV: RRR, no murmur, extremities appear well perfused.  Abd: soft, notender, nondistended Back: no ttp, no cva ttp Skin: warm and dry Ext: normal to inspection, no dependent edema, no lesions, DP pulses are palpable bilaterally Neuro: CN ii-xii grossly intact, no focal deficits Psyche; extremely agitated affect  ED Course  Procedures (including critical care time) Labs Review  Results for orders placed during the hospital encounter of 09/21/13 (from the past  24 hour(s))  CBG MONITORING, ED     Status: Abnormal   Collection Time    09/21/13  5:29 AM      Result Value Ref Range   Glucose-Capillary 60 (*) 70 - 99 mg/dL  COMPREHENSIVE METABOLIC PANEL     Status: Abnormal   Collection Time    09/21/13  5:30 AM      Result Value Ref Range   Sodium 142  137 - 147 mEq/L   Potassium 3.2 (*) 3.7 - 5.3 mEq/L   Chloride 107  96 - 112 mEq/L   CO2 22  19 - 32 mEq/L   Glucose, Bld 62 (*) 70 - 99  mg/dL   BUN 15  6 - 23 mg/dL   Creatinine, Ser 5.361.01  0.50 - 1.35 mg/dL   Calcium 9.0  8.4 - 64.410.5 mg/dL   Total Protein 6.8  6.0 - 8.3 g/dL   Albumin 3.8  3.5 - 5.2 g/dL   AST 73 (*) 0 - 37 U/L   ALT 49  0 - 53 U/L   Alkaline Phosphatase 54  39 - 117 U/L   Total Bilirubin 0.3  0.3 - 1.2 mg/dL   GFR calc non Af Amer 77 (*) >90 mL/min   GFR calc Af Amer 89 (*) >90 mL/min   Anion gap 13  5 - 15  CK     Status: Abnormal   Collection Time    09/21/13  5:30 AM      Result Value Ref Range   Total CK 2221 (*) 7 - 232 U/L  MAGNESIUM     Status: None   Collection Time    09/21/13  5:30 AM      Result Value Ref Range   Magnesium 2.0  1.5 - 2.5 mg/dL  PHOSPHORUS     Status: None   Collection Time    09/21/13  5:30 AM      Result Value Ref Range   Phosphorus 3.2  2.3 - 4.6 mg/dL  CBC     Status: Abnormal   Collection Time    09/21/13  5:30 AM      Result Value Ref Range   WBC 4.6  4.0 - 10.5 K/uL   RBC 4.12 (*) 4.22 - 5.81 MIL/uL   Hemoglobin 10.6 (*) 13.0 - 17.0 g/dL   HCT 03.432.6 (*) 74.239.0 - 59.552.0 %   MCV 79.1  78.0 - 100.0 fL   MCH 25.7 (*) 26.0 - 34.0 pg   MCHC 32.5  30.0 - 36.0 g/dL   RDW 63.814.0  75.611.5 - 43.315.5 %   Platelets 117 (*) 150 - 400 K/uL  I-STAT TROPOININ, ED     Status: None   Collection Time    09/21/13  5:35 AM      Result Value Ref Range   Troponin i, poc 0.02  0.00 - 0.08 ng/mL   Comment 3           CBG MONITORING, ED     Status: Abnormal   Collection Time    09/21/13  6:12 AM      Result Value Ref Range   Glucose-Capillary 110 (*)  70 - 99 mg/dL    EKG: nsr, no acute ischemic changes, normal intervals, normal axis, normal qrs complex   MDM   Final diagnoses:  None    0530: Notified by nursing that patient had an episode of sinus bradycardia to the 30s. I examined the patient again to find him sleeping and snoring. Sats in 80s - likely secondary  Response to dilaudid. We have placed on supplemental O2. We are awaiting results of labs.   [id number]: patient with > 10% bump in CK since discharge 24 hrs ago. Pain free but only be virtue of high dose of IV Dilaudid. We are treating with IV. I am not convinced that the patient's episodes of severe and diffuse muscle pain is secondary to restless leg syndrome. The patient's wife feels that he was discharged too soon and points out, again, that his sx were uncontrolled almost immediately after his discharge yesterday. I have asked Dr. Lendell CapriceSullivan to see the patient for admission/observation.    Brandt LoosenJulie Manly,  MD 09/21/13 7829

## 2013-09-21 NOTE — Consult Note (Signed)
[facility] Consult note  [name] ZOX:[id number] DOB: 1950/08/03 DOA: 09/21/2013 PCP: Kimber RelicGREEN, ARTHUR G, MD  Assessment/Plan:  Active Problems:   Restless legs syndrome (RLS)   Elevated CPK transient bradycardia.  Patient is now comfortable. CPK not terribly changed from yesterday. Bradycardia occurred during last hospitalization with sleep and coreg dose decreased. Pt has appointment with neuro on 7/21. Pt is on 400 gabapentin tid, klonipin 0.5 mg bid and 1 mg dilaudid prn.  I do not think patient needs to be admitted, as nothing more to offer in the inpatient arena. Can increase gabapentin, klonipin and dilaudid at home.  Discussed with Dr. Jerral RalphGhimire, discharging physician. And he agrees another admission will not accomplish anything.  However, I have consulted Dr. Leroy Kennedyamilo, neuro to see if anything else might help, or if readmission is needed for another reason.  If neuro feels admission warranted, I will admit, otherwise, can discharge home. Wife agreeable to this plan.  Spoke to Dr. Lavella LemonsManly EDP.   HPI/Subjective: 63 year old WM with severe RLS and just discharged yesterday for mild rhabdomyolysis. Statin mirapex stopped. Discharged on gabapentin, klonipin and dilaudid. See above. CPK at discharge 1800. Today, 2200. Per EDP, pt was thrashing around in bed. Now calm and sleepy after 2 mg IV dilaudid.  Has been compliant with discharge medications. HR transiently low after dilaudid, but now normal.  Objective: Filed Vitals:   09/21/13 0715  BP: 115/79  Pulse: 58  Temp:   Resp: 7    Intake/Output Summary (Last 24 hours) at 09/21/13 [id number] Last data filed at 09/21/13 [id number]  Gross per 24 hour  Intake   2000 ml  Output      0 ml  Net   2000 ml   Filed Weights   09/21/13 0102  Weight: 83.915 kg (185 lb)    Exam:  BP 115/79  Pulse 58  Temp(Src) 97.6 F (36.4 C) (Oral)  Resp 7  Ht 5\' 8"  (1.727 m)  Wt 83.915 kg (185 lb)  BMI 28.14 kg/m2  SpO2 100%  General Appearance:     Asleep. Arousable. comfortable  Head:    Normocephalic, without obvious abnormality, atraumatic  Eyes:    PERRL,          Nose:   Nares normal, septum midline, mucosa normal, no drainage   or sinus tenderness  Throat:   Lips, mucosa, and tongue normal; teeth and gums normal  Neck:   Supple, symmetrical, trachea midline, no adenopathy;       thyroid:  No enlargement/tenderness/nodules; no carotid   bruit or JVD  Back:     Symmetric, no curvature, ROM normal, no CVA tenderness  Lungs:     Clear to auscultation bilaterally, respirations unlabored  Chest wall:    No tenderness or deformity  Heart:    Regular rate and rhythm, S1 and S2 normal, no murmur, rub   or gallop  Abdomen:     Soft, non-tender, bowel sounds active all four quadrants,    no masses, no organomegaly  Genitalia:    deferred  Rectal:    deferred  Extremities:   Extremities normal, atraumatic, no cyanosis or edema  Pulses:   2+ and symmetric all extremities  Skin:   Skin color, texture, turgor normal, no rashes or lesions  Lymph nodes:   Cervical, supraclavicular, and axillary nodes normal  Neurologic:   CNII-XII intact. Normal strength, sensation and reflexes      throughout   Basic Metabolic Panel:  Recent Labs Lab  09/18/13 0522 09/18/13 0526 09/18/13 2011 09/19/13 0411 09/20/13 0508 09/21/13 0530  NA  --  142 140 142 144 142  K  --  3.8 3.9 4.1 3.6* 3.2*  CL  --  109 105 106 112 107  CO2  --   --   --  23 21 22   GLUCOSE  --  97 85 97 80 62*  BUN  --  26* 20 17 14 15   CREATININE  --  1.40* 1.20 1.03 0.97 1.01  CALCIUM  --   --   --  7.9* 6.9* 9.0  MG 1.9  --   --   --   --  2.0  PHOS  --   --   --   --   --  3.2   Liver Function Tests:  Recent Labs Lab 09/19/13 0411 09/21/13 0530  AST 83* 73*  ALT 42 49  ALKPHOS 50 54  BILITOT 0.4 0.3  PROT 6.0 6.8  ALBUMIN 3.4* 3.8   No results found for this basename: LIPASE, AMYLASE,  in the last 168 hours No results found for this basename: AMMONIA,  in  the last 168 hours CBC:  Recent Labs Lab 09/18/13 0526 09/18/13 2002 09/18/13 2011 09/19/13 0411 09/21/13 0530  WBC  --  6.5  --  3.8* 4.6  HGB 12.9* 11.4* 12.6* 10.1* 10.6*  HCT 38.0* 35.7* 37.0* 32.2* 32.6*  MCV  --  78.1  --  80.7 79.1  PLT  --  131*  --  101* 117*   Cardiac Enzymes:  Recent Labs Lab 09/18/13 0522 09/18/13 2002 09/19/13 0411 09/20/13 0508 09/21/13 0530  CKTOTAL 1765* 4907* 3307* 1835* 2221*   BNP (last 3 results) No results found for this basename: PROBNP,  in the last 8760 hours CBG:  Recent Labs Lab 09/19/13 2056 09/20/13 0755 09/20/13 0834 09/21/13 0529 09/21/13 0612  GLUCAP 121* 65* 85 60* 110*    No results found for this or any previous visit (from the past 240 hour(s)).   Studies: No results found.  Time spent: 55 minutes  Koralyn Prestage L  [facility] Pager [phone]. If 7PM-7AM, please contact night-coverage at [email or web address], password [facility] 09/21/2013, 8:22 AM  LOS: 0 days

## 2013-09-21 NOTE — Discharge Instructions (Signed)
Rhabdomyolysis Rhabdomyolysis is the breakdown of muscle fibers due to injury. The injury may come from physical damage to the muscle like an injury but other causes are:  High fever (hyperthermia).  Seizures (convulsions).  Low phosphate levels.  Diseases of metabolism.  Heatstroke.  Drug toxicity.  Over exertion.  Alcoholism.  Muscle is cut off from oxygen (anoxia).  The squeezing of nerves and blood vessels (compartment syndrome). Some drugs which may cause the breakdown of muscle are:  Antibiotics.  Statins.  Alcohol.  Animal toxins. Myoglobin is a substance which helps muscle use oxygen. When the muscle is damaged, the myoglobin is released into the bloodstream. It is filtered out of the bloodstream by the kidneys. Myoglobin may block up the kidneys. This may cause damage, such as kidney failure. It also breaks down into other damaging toxic parts, which also cause kidney failure.  SYMPTOMS   Dark, red, or tea colored urine.  Weakness of affected muscles.  Weight gain from water retention.  Joint aches and pains.  Irregular heart from high potassium in the blood.  Muscle tenderness or aching.  Generalized weakness.  Seizures.  Feeling tired (fatigue). DIAGNOSIS  Your caregiver may find muscle tenderness on exam and suspect the problem. Urine tests and blood work can confirm the problem. TREATMENT   Early and aggressive treatment with large amounts of fluids may help prevent kidney failure.  Water producing medicine (diuretic) may be used to help flush the kidneys.  High potassium and calcium problems (electrolyte) in your blood may need treatment. HOME CARE INSTRUCTIONS  This problem is usually cared for in a hospital. If you are allowed to go home and require dialysis, make sure you keep all appointments for lab work and dialysis. Not doing so could result in death. Document Released: 03/02/04 Document Revised: 05/13/2011 Document Reviewed:  08/15/2008 [facility] Patient Information 2015 [place], [place]. This information is not intended to replace advice given to you by your health care provider. Make sure you discuss any questions you have with your health care provider.  Muscle Pain Muscle pain (myalgia) may be caused by many things, including:  Overuse or muscle strain, especially if you are not in shape. This is the most common cause of muscle pain.  Injury.  Bruises.  Viruses, such as the flu.  Infectious diseases.  Fibromyalgia, which is a chronic condition that causes muscle tenderness, fatigue, and headache.  Autoimmune diseases, including lupus.  Certain drugs, including ACE inhibitors and statins. Muscle pain may be mild or severe. In most cases, the pain lasts only a short time and goes away without treatment. To diagnose the cause of your muscle pain, your health care provider will take your medical history. This means he or she will ask you when your muscle pain began and what has been happening. If you have not had muscle pain for very long, your health care provider may want to wait before doing much testing. If your muscle pain has lasted a long time, your health care provider may want to run tests right away. If your health care provider thinks your muscle pain may be caused by illness, you may need to have additional tests to rule out certain conditions.  Treatment for muscle pain depends on the cause. Home care is often enough to relieve muscle pain. Your health care provider may also prescribe anti-inflammatory medicine. HOME CARE INSTRUCTIONS Watch your condition for any changes. The following actions may help to lessen any discomfort you are feeling:  Only take  over-the-counter or prescription medicines as directed by your health care provider.  Apply ice to the sore muscle:  Put ice in a plastic bag.  Place a towel between your skin and the bag.  Leave the ice on for 15-20 minutes, 3-4 times a  day.  You may alternate applying hot and cold packs to the muscle as directed by your health care provider.  If overuse is causing your muscle pain, slow down your activities until the pain goes away.  Remember that it is normal to feel some muscle pain after starting a workout program. Muscles that have not been used often will be sore at first.  Do regular, gentle exercises if you are not usually active.  Warm up before exercising to lower your risk of muscle pain.  Do not continue working out if the pain is very bad. Bad pain could mean you have injured a muscle. SEEK MEDICAL CARE IF:  Your muscle pain gets worse, and medicines do not help.  You have muscle pain that lasts longer than 3 days.  You have a rash or fever along with muscle pain.  You have muscle pain after a tick bite.  You have muscle pain while working out, even though you are in good physical condition.  You have redness, soreness, or swelling along with muscle pain.  You have muscle pain after starting a new medicine or changing the dose of a medicine. SEEK IMMEDIATE MEDICAL CARE IF:  You have trouble breathing.  You have trouble swallowing.  You have muscle pain along with a stiff neck, fever, and vomiting.  You have severe muscle weakness or cannot move part of your body. MAKE SURE YOU:   Understand these instructions.  Will watch your condition.  Will get help right away if you are not doing well or get worse. Document Released: 01/10/2006 Document Revised: 02/23/2013 Document Reviewed: 12/15/2012 [facility] Patient Information 2015 [place]. This information is not intended to replace advice given to you by your health care provider. Make sure you discuss any questions you have with your health care provider.

## 2013-09-21 NOTE — ED Notes (Signed)
Patient is resting comfortably. 

## 2013-09-21 NOTE — Consult Note (Signed)
NEURO HOSPITALIST CONSULT NOTE    Reason for Consult: severe RLS  HPI:                                                                                                                                          [name] is an 63 y.o. male with a past medical history significant for hyperlipidemia, HTN, CAD, COPD, IDDM, RLS, recent admission for pain related to restless leg syndrome and rhabdomyolysis. He was discharged < 24 hrs ago with script for Dilaudid 44m tablets to take in 1/2 increments and gabapentin 400 mg TID, and returns today complaining of lack of control of his RLS related symptoms. Mr. PHaigtells me that he has had RLS for more than 20 years, treated with pramipexole which worked for a while but then stop working and he has been having uncontrollable symptoms in the last 6 months, worse in the past 2-3 weeks. He indicated that his RLS related symptoms are spreading to his arms. Typically, he will experience an uncomfortable, unpleasant sensation in his legs around 5 or 6 pm, and this intensifies throughout the night to the point that he can not sleep and has to pace the floor all night long. Ferritin was found to be low recently and he just started taking Iron. Mild numbness and tingling feet, but denies HA, vertigo, double vision, focal weakness, slurred speech, language or vision impairment.  Past Medical History  Diagnosis Date  . Restless leg   . Cervicalgia   . Loss of weight   . Impotence of organic origin   . Umbilical hernia without mention of obstruction or gangrene   . Barrett's esophagus   . Unspecified gastritis and gastroduodenitis with hemorrhage   . Hyperlipidemia   . Other malaise and fatigue   . Hypoglycemia, unspecified   . GERD (gastroesophageal reflux disease)   . Complication of anesthesia     " i DO NOT WAKE UP VERY WELL "  . PONV (postoperative nausea and vomiting)   . Hypertension   . Heart murmur     "grew out of it"  (12/15/2012)  . Chronic bronchitis     "not bad lately; used to get it 3-4 times/yr" (12/15/2012)  . IDDM (insulin dependent diabetes mellitus)   . H/O hiatal hernia   . Tension headache   . Headache     "weekly" (12/15/2012)  . COPD (chronic obstructive pulmonary disease)     Past Surgical History  Procedure Laterality Date  . Knee surgery Right 1992 and 2000    "calcium deposits removed" (12/15/2012)  . Coronary angioplasty with stent placement  12/15/2012    "2 stents" (12/15/2012)  . Hernia repair  20947   "umbilical" (109/62/8366  . Incision and drainage abscess Left 05/2007    "  groin" (12/15/2012)    History reviewed. No pertinent family history.   Social History:  reports that he has never smoked. He has never used smokeless tobacco. He reports that he does not drink alcohol or use illicit drugs.  No Known Allergies  MEDICATIONS:                                                                                                                     I have reviewed the patient's current medications.   ROS:                                                                                                                                       History obtained from the patient  General ROS: negative for - chills, fatigue, fever, night sweats, or weight loss Psychological ROS: negative for - behavioral disorder, hallucinations, memory difficulties, mood swings or suicidal ideation Ophthalmic ROS: negative for - blurry vision, double vision, eye pain or loss of vision ENT ROS: negative for - epistaxis, nasal discharge, oral lesions, sore throat, tinnitus or vertigo Allergy and Immunology ROS: negative for - hives or itchy/watery eyes Hematological and Lymphatic ROS: negative for - bleeding problems, bruising or swollen lymph nodes Endocrine ROS: negative for - galactorrhea, hair pattern changes, polydipsia/polyuria or temperature intolerance Respiratory ROS: negative for - cough,  hemoptysis, shortness of breath or wheezing Cardiovascular ROS: negative for - chest pain, dyspnea on exertion, edema or irregular heartbeat Gastrointestinal ROS: negative for - abdominal pain, diarrhea, hematemesis, nausea/vomiting or stool incontinence Genito-Urinary ROS: negative for - dysuria, hematuria, incontinence or urinary frequency/urgency Musculoskeletal ROS: negative for - joint swelling or muscular weakness Neurological ROS: as noted in HPI Dermatological ROS: negative for rash and skin lesion changes   Physical exam: pleasant male in no apparent distress. Blood pressure 115/79, pulse 58, temperature 97.6 F (36.4 C), temperature source Oral, resp. rate 7, height 5' 8" (1.727 m), weight 83.915 kg (185 lb), SpO2 100.00%. Head: normocephalic. Neck: supple, no bruits, no JVD. Cardiac: no murmurs. Lungs: clear. Abdomen: soft, no tender, no mass. Extremities: no edema. Neurologic Examination:                                                                                                        General: Mental Status: Alert, oriented, thought content appropriate.  Speech fluent without evidence of aphasia.  Able to follow 3 step commands without difficulty. Cranial Nerves: II: Discs flat bilaterally; Visual fields grossly normal, pupils equal, round, reactive to light and accommodation III,IV, VI: ptosis not present, extra-ocular motions intact bilaterally V,VII: smile symmetric, facial light touch sensation normal bilaterally VIII: hearing normal bilaterally IX,X: gag reflex present XI: bilateral shoulder shrug XII: midline tongue extension without atrophy or fasciculations  Motor: Right : Upper extremity   5/5    Left:     Upper extremity   5/5  Lower extremity   5/5     Lower extremity   5/5 Tone and bulk:normal tone throughout; no atrophy noted Sensory: Pinprick and light touch intact throughout, bilaterally Deep Tendon Reflexes:  Right: Upper Extremity   Left: Upper  extremity   biceps (C-5 to C-6) 2/4   biceps (C-5 to C-6) 2/4 tricep (C7) 2/4    triceps (C7) 2/4 Brachioradialis (C6) 2/4  Brachioradialis (C6) 2/4  Lower Extremity Lower Extremity  quadriceps (L-2 to L-4) 2/4   quadriceps (L-2 to L-4) 2/4 Achilles (S1) 2/4   Achilles (S1) 2/4  Plantars: Right: downgoing   Left: downgoing Cerebellar: normal finger-to-nose,  normal heel-to-shin test Gait:  No tested CV: pulses palpable throughout    Lab Results  Component Value Date/Time   CHOL 116 12/21/2012  2:40 AM    Results for orders placed during the hospital encounter of 09/21/13 (from the past 48 hour(s))  CBG MONITORING, ED     Status: Abnormal   Collection Time    09/21/13  5:29 AM      Result Value Ref Range   Glucose-Capillary 60 (*) 70 - 99 mg/dL  COMPREHENSIVE METABOLIC PANEL     Status: Abnormal   Collection Time    09/21/13  5:30 AM      Result Value Ref Range   Sodium 142  137 - 147 mEq/L   Potassium 3.2 (*) 3.7 - 5.3 mEq/L   Chloride 107  96 - 112 mEq/L   CO2 22  19 - 32 mEq/L   Glucose, Bld 62 (*) 70 - 99 mg/dL   BUN 15  6 - 23 mg/dL   Creatinine, Ser 1.01  0.50 - 1.35 mg/dL   Calcium 9.0  8.4 - 10.5 mg/dL   Total Protein 6.8  6.0 - 8.3 g/dL   Albumin 3.8  3.5 - 5.2 g/dL   AST 73 (*) 0 - 37 U/L   ALT 49  0 - 53 U/L   Alkaline Phosphatase 54  39 - 117 U/L   Total Bilirubin 0.3  0.3 - 1.2 mg/dL   GFR calc non Af Amer 77 (*) >90 mL/min   GFR calc Af Amer 89 (*) >90 mL/min   Comment: (NOTE)     The eGFR has been calculated using the CKD EPI equation.     This calculation has not been validated in all clinical situations.     eGFR's persistently <90 mL/min signify possible Chronic Kidney     Disease.   Anion gap 13  5 - 15  CK     Status: Abnormal   Collection Time    09/21/13  5:30 AM      Result Value Ref Range   Total CK 2221 (*) 7 - 232 U/L  MAGNESIUM     Status: None   Collection Time    09/21/13  5:30 AM  Result Value Ref Range   Magnesium 2.0   1.5 - 2.5 mg/dL  PHOSPHORUS     Status: None   Collection Time    09/21/13  5:30 AM      Result Value Ref Range   Phosphorus 3.2  2.3 - 4.6 mg/dL  CBC     Status: Abnormal   Collection Time    09/21/13  5:30 AM      Result Value Ref Range   WBC 4.6  4.0 - 10.5 K/uL   RBC 4.12 (*) 4.22 - 5.81 MIL/uL   Hemoglobin 10.6 (*) 13.0 - 17.0 g/dL   HCT 32.6 (*) 39.0 - 52.0 %   MCV 79.1  78.0 - 100.0 fL   MCH 25.7 (*) 26.0 - 34.0 pg   MCHC 32.5  30.0 - 36.0 g/dL   RDW 14.0  11.5 - 15.5 %   Platelets 117 (*) 150 - 400 K/uL   Comment: CONSISTENT WITH PREVIOUS RESULT  I-STAT TROPOININ, ED     Status: None   Collection Time    09/21/13  5:35 AM      Result Value Ref Range   Troponin i, poc 0.02  0.00 - 0.08 ng/mL   Comment 3            Comment: Due to the release kinetics of cTnI,     a negative result within the first hours     of the onset of symptoms does not rule out     myocardial infarction with certainty.     If myocardial infarction is still suspected,     repeat the test at appropriate intervals.  CBG MONITORING, ED     Status: Abnormal   Collection Time    09/21/13  6:12 AM      Result Value Ref Range   Glucose-Capillary 110 (*) 70 - 99 mg/dL    No results found.      Assessment/Plan: 63 y/o with severe RLS causing significant sleep fragmentation. Pramipexole ceased to be effective and can not be continue also due to patient recent rhabdomyolysis.  On gabapentin 400 mg TID, low  dose dilaudid, and clonazepam. Will recommend switching to gabapentin enacarbil (HORIZANT ER) 300 mg at bedtime with food, as it is more reliable absorbed and should provide better symptomatic control that gabapentin. I advised patient to slowly escalate the dose by 300 mg every 4 days unless side effects ensues. Agree with continuing clonazepam and short course of low dose clonazepam. Continue Iron. Needs to follow up with neurology, preferably movement disorder specialist at [facility] or [facility]  neurology. From a neuro standpoint, he doesn't requires admission to the hospital.   Dorian Pod, MD 09/21/2013, 9:18 AM

## 2013-09-21 NOTE — ED Notes (Signed)
Patient c/o increasing pain in his legs. Patient moving around in bed-unable to sit still.  Dr. Lavella LemonsManly made aware-no new orders given at this time.

## 2013-09-21 NOTE — ED Notes (Signed)
MD at bedside. 

## 2013-09-22 ENCOUNTER — Encounter: Payer: Self-pay | Admitting: Internal Medicine

## 2013-09-22 ENCOUNTER — Ambulatory Visit (INDEPENDENT_AMBULATORY_CARE_PROVIDER_SITE_OTHER): Payer: BC Managed Care – PPO | Admitting: Internal Medicine

## 2013-09-22 VITALS — BP 154/88 | HR 57 | Temp 97.7°F | Resp 13 | Ht 68.0 in | Wt 194.2 lb

## 2013-09-22 DIAGNOSIS — I10 Essential (primary) hypertension: Secondary | ICD-10-CM

## 2013-09-22 DIAGNOSIS — D509 Iron deficiency anemia, unspecified: Secondary | ICD-10-CM | POA: Insufficient documentation

## 2013-09-22 DIAGNOSIS — R748 Abnormal levels of other serum enzymes: Secondary | ICD-10-CM

## 2013-09-22 DIAGNOSIS — E1159 Type 2 diabetes mellitus with other circulatory complications: Secondary | ICD-10-CM

## 2013-09-22 DIAGNOSIS — G2581 Restless legs syndrome: Secondary | ICD-10-CM

## 2013-09-22 DIAGNOSIS — M6282 Rhabdomyolysis: Secondary | ICD-10-CM

## 2013-09-22 MED ORDER — CARBIDOPA-LEVODOPA 10-100 MG PO TABS: ORAL_TABLET | ORAL | Status: DC

## 2013-09-22 NOTE — Progress Notes (Signed)
Patient ID: [name], male   DOB: Feb 26, 1951, 63 y.o.   MRN: [id number]    Location:    [facility]  Place of Service:  OFFICE    No Known Allergies  Chief Complaint  Patient presents with  . Acute Visit    Was in ER last night because of RLS and now complains with feet swelling    HPI:  Patient has had a very rough time since i last saw him. He has had multiple trips to the ER for intolerable leg pains and restless leg feelings. The only meds that have given him any relief are Dilaudid. He is also on gabapentin, but it is not helping so far. He was seen by neurologist at the hospital and was told to follow up as an outpatient.  Leg swelling seems to be much worse since on the gabapentin and is likely related to the known side effects of this drug.  Anemia has been noted. There is no history of blood loss. Serum ferritin is low.  He now has large bruised areas on each extremity that he says are popping out without any trauma.  He has myalgias. Lab at the hospital showed an elevated CK.  Medications: Patient's Medications  New Prescriptions   No medications on file  Previous Medications   ACETAMINOPHEN (TYLENOL) 500 MG TABLET    Take 1,000-1,500 mg by mouth every 6 (six) hours as needed for headache.   ASPIRIN 81 MG EC TABLET    Take 1 tablet (81 mg total) by mouth daily.   CARVEDILOL (COREG) 6.25 MG TABLET    Take 0.5 tablets (3.125 mg total) by mouth 2 (two) times daily with a meal.   CLONAZEPAM (KLONOPIN) 0.5 MG TABLET    Take 1 tablet (0.5 mg total) by mouth 2 (two) times daily.   FERROUS SULFATE 325 (65 FE) MG TABLET    Take 1 tablet (325 mg total) by mouth 2 (two) times daily with a meal.   FLUTICASONE FUROATE-VILANTEROL (BREO ELLIPTA) 100-25 MCG/INH AEPB    Inhale into the lungs once daily   GABAPENTIN ENACARBIL ER 300 MG TBCR    Take 300 mg by mouth at bedtime. With food. May increase by 1 tablet in 4 days if needed   HYDROMORPHONE (DILAUDID) 2 MG TABLET    Take 1-2 tablets  (2-4 mg total) by mouth every 6 (six) hours as needed for severe pain.   INSULIN GLARGINE (LANTUS) 100 UNIT/ML SOPN    Inject 20-26 Units into the skin 2 (two) times daily. 20 units in the morning and 26 units in the evening.   METHOCARBAMOL (ROBAXIN-750) 750 MG TABLET    Take 1 tablet (750 mg total) by mouth every 6 (six) hours as needed for muscle spasms.   NITROGLYCERIN (NITROSTAT) 0.4 MG SL TABLET    Place 0.4 mg under the tongue every 5 (five) minutes as needed for chest pain.   PANTOPRAZOLE (PROTONIX) 40 MG TABLET    Take 40 mg by mouth 2 (two) times daily.   PRASUGREL (EFFIENT) 10 MG TABS TABLET    Take 1 tablet (10 mg total) by mouth daily.   SENNA (SENOKOT) 8.6 MG TABS TABLET    Take 1 tablet (8.6 mg total) by mouth daily.  Modified Medications   No medications on file  Discontinued Medications   No medications on file     Review of Systems  Constitutional: Positive for fatigue. Negative for fever.  HENT: Positive for hearing loss. Negative for  congestion and ear pain.   Eyes: Negative.        Wears prescription lenses.  Respiratory: Positive for chest tightness and shortness of breath (with exertion). Negative for cough, choking and wheezing.   Cardiovascular: Negative for chest pain, palpitations and leg swelling.  Gastrointestinal: Negative for nausea, vomiting, abdominal pain, diarrhea, constipation and abdominal distention.       Barrett's esophagus and reflux history  Endocrine: Negative for cold intolerance, heat intolerance, polydipsia, polyphagia and polyuria.       Diabetes under poor control.  Genitourinary: Negative.   Musculoskeletal: Positive for arthralgias, back pain, gait problem, joint swelling and myalgias. Negative for neck pain and neck stiffness.  Skin: Negative.   Allergic/Immunologic: Negative.   Neurological: Positive for weakness and headaches. Negative for dizziness, tremors, seizures, syncope, facial asymmetry, speech difficulty, light-headedness and  numbness.       Severe restless legs  Hematological: Bruises/bleeds easily.       Iron deficiency anemia without obvious blood loss.  Psychiatric/Behavioral: The patient is nervous/anxious.     Filed Vitals:   09/22/13 1232  BP: 154/88  Pulse: 57  Temp: 97.7 F (36.5 C)  TempSrc: Oral  Resp: 13  Height: 5' 8"  (1.727 m)  Weight: 194 lb 3.2 oz (88.089 kg)   Body mass index is 29.54 kg/(m^2).  Physical Exam  Constitutional: He is oriented to person, place, and time. He appears well-developed and well-nourished. No distress.  Significantly overweight.  HENT:  Head: Normocephalic and atraumatic.  Right Ear: External ear normal.  Left Ear: External ear normal.  Nose: Nose normal.  Mouth/Throat: Oropharynx is clear and moist.  Eyes: Conjunctivae and EOM are normal. Pupils are equal, round, and reactive to light.  Neck: Normal range of motion. Neck supple. No JVD present. No tracheal deviation present. No thyromegaly present.  Hoarse.   Cardiovascular: Normal rate, regular rhythm, normal heart sounds and intact distal pulses.  Exam reveals no gallop and no friction rub.   No murmur heard. Pulmonary/Chest: Effort normal. No respiratory distress. He has no wheezes. He has no rales. He exhibits no tenderness.  Abdominal: Bowel sounds are normal. He exhibits no distension and no mass.  Musculoskeletal: He exhibits no edema and no tenderness.  Bilateral shoulder discomfort with some restriction of movement especially at the left shoulder. Painful to raise left arm. Painful to lift items and to rotate at left shoulder. Has been seen by Dr. Noemi Chapel. Chronic neck pains. Tender at insertion of the left Achilles tendon.  Lymphadenopathy:    He has no cervical adenopathy.  Neurological: He is alert and oriented to person, place, and time. He has normal reflexes. No cranial nerve deficit. Coordination normal.  Bilateral restless legs. He is in constant motion in the exam room to keep himself  comfortable.  Skin: Skin is warm and dry. No rash noted. No erythema. No pallor.  Large ecchymoses of each extremity. No petechia.  Psychiatric: He has a normal mood and affect. His behavior is normal. Judgment and thought content normal.     Labs reviewed: Admission on 09/21/2013, Discharged on 09/21/2013  Component Date Value Ref Range Status  . Glucose-Capillary 09/21/2013 60* 70 - 99 mg/dL Final  . Troponin i, poc 09/21/2013 0.02  0.00 - 0.08 ng/mL Final  . Comment 3 09/21/2013          Final   Comment: Due to the release kinetics of cTnI,  a negative result within the first hours                          of the onset of symptoms does not rule out                          myocardial infarction with certainty.                          If myocardial infarction is still suspected,                          repeat the test at appropriate intervals.  . Sodium 09/21/2013 142  137 - 147 mEq/L Final  . Potassium 09/21/2013 3.2* 3.7 - 5.3 mEq/L Final  . Chloride 09/21/2013 107  96 - 112 mEq/L Final  . CO2 09/21/2013 22  19 - 32 mEq/L Final  . Glucose, Bld 09/21/2013 62* 70 - 99 mg/dL Final  . BUN 09/21/2013 15  6 - 23 mg/dL Final  . Creatinine, Ser 09/21/2013 1.01  0.50 - 1.35 mg/dL Final  . Calcium 09/21/2013 9.0  8.4 - 10.5 mg/dL Final  . Total Protein 09/21/2013 6.8  6.0 - 8.3 g/dL Final  . Albumin 09/21/2013 3.8  3.5 - 5.2 g/dL Final  . AST 09/21/2013 73* 0 - 37 U/L Final  . ALT 09/21/2013 49  0 - 53 U/L Final  . Alkaline Phosphatase 09/21/2013 54  39 - 117 U/L Final  . Total Bilirubin 09/21/2013 0.3  0.3 - 1.2 mg/dL Final  . GFR calc non Af Amer 09/21/2013 77* >90 mL/min Final  . GFR calc Af Amer 09/21/2013 89* >90 mL/min Final   Comment: (NOTE)                          The eGFR has been calculated using the CKD EPI equation.                          This calculation has not been validated in all clinical situations.                          eGFR's  persistently <90 mL/min signify possible Chronic Kidney                          Disease.  . Anion gap 09/21/2013 13  5 - 15 Final  . Total CK 09/21/2013 2221* 7 - 232 U/L Final  . Magnesium 09/21/2013 2.0  1.5 - 2.5 mg/dL Final  . Phosphorus 09/21/2013 3.2  2.3 - 4.6 mg/dL Final  . WBC 09/21/2013 4.6  4.0 - 10.5 K/uL Final  . RBC 09/21/2013 4.12* 4.22 - 5.81 MIL/uL Final  . Hemoglobin 09/21/2013 10.6* 13.0 - 17.0 g/dL Final  . HCT 09/21/2013 32.6* 39.0 - 52.0 % Final  . MCV 09/21/2013 79.1  78.0 - 100.0 fL Final  . MCH 09/21/2013 25.7* 26.0 - 34.0 pg Final  . MCHC 09/21/2013 32.5  30.0 - 36.0 g/dL Final  . RDW 09/21/2013 14.0  11.5 - 15.5 % Final  . Platelets 09/21/2013 117* 150 - 400 K/uL Final   CONSISTENT WITH PREVIOUS RESULT  . Color, Urine 09/21/2013 YELLOW  YELLOW Final  .  APPearance 09/21/2013 CLEAR  CLEAR Final  . Specific Gravity, Urine 09/21/2013 1.014  1.005 - 1.030 Final  . pH 09/21/2013 6.0  5.0 - 8.0 Final  . Glucose, UA 09/21/2013 NEGATIVE  NEGATIVE mg/dL Final  . Hgb urine dipstick 09/21/2013 NEGATIVE  NEGATIVE Final  . Bilirubin Urine 09/21/2013 NEGATIVE  NEGATIVE Final  . Ketones, ur 09/21/2013 NEGATIVE  NEGATIVE mg/dL Final  . Protein, ur 09/21/2013 NEGATIVE  NEGATIVE mg/dL Final  . Urobilinogen, UA 09/21/2013 0.2  0.0 - 1.0 mg/dL Final  . Nitrite 09/21/2013 NEGATIVE  NEGATIVE Final  . Leukocytes, UA 09/21/2013 NEGATIVE  NEGATIVE Final   MICROSCOPIC NOT DONE ON URINES WITH NEGATIVE PROTEIN, BLOOD, LEUKOCYTES, NITRITE, OR GLUCOSE <1000 mg/dL.  Marland Kitchen Glucose-Capillary 09/21/2013 110* 70 - 99 mg/dL Final  Admission on 09/18/2013, Discharged on 09/20/2013  Component Date Value Ref Range Status  . Total CK 09/18/2013 4907* 7 - 232 U/L Final  . WBC 09/18/2013 6.5  4.0 - 10.5 K/uL Final  . RBC 09/18/2013 4.57  4.22 - 5.81 MIL/uL Final  . Hemoglobin 09/18/2013 11.4* 13.0 - 17.0 g/dL Final  . HCT 09/18/2013 35.7* 39.0 - 52.0 % Final  . MCV 09/18/2013 78.1  78.0 - 100.0 fL  Final  . MCH 09/18/2013 24.9* 26.0 - 34.0 pg Final  . MCHC 09/18/2013 31.9  30.0 - 36.0 g/dL Final  . RDW 09/18/2013 13.8  11.5 - 15.5 % Final  . Platelets 09/18/2013 131* 150 - 400 K/uL Final  . Sodium 09/18/2013 140  137 - 147 mEq/L Final  . Potassium 09/18/2013 3.9  3.7 - 5.3 mEq/L Final  . Chloride 09/18/2013 105  96 - 112 mEq/L Final  . BUN 09/18/2013 20  6 - 23 mg/dL Final  . Creatinine, Ser 09/18/2013 1.20  0.50 - 1.35 mg/dL Final  . Glucose, Bld 09/18/2013 85  70 - 99 mg/dL Final  . Calcium, Ion 09/18/2013 1.17  1.13 - 1.30 mmol/L Final  . TCO2 09/18/2013 21  0 - 100 mmol/L Final  . Hemoglobin 09/18/2013 12.6* 13.0 - 17.0 g/dL Final  . HCT 09/18/2013 37.0* 39.0 - 52.0 % Final  . Sodium 09/19/2013 142  137 - 147 mEq/L Final  . Potassium 09/19/2013 4.1  3.7 - 5.3 mEq/L Final  . Chloride 09/19/2013 106  96 - 112 mEq/L Final  . CO2 09/19/2013 23  19 - 32 mEq/L Final  . Glucose, Bld 09/19/2013 97  70 - 99 mg/dL Final  . BUN 09/19/2013 17  6 - 23 mg/dL Final  . Creatinine, Ser 09/19/2013 1.03  0.50 - 1.35 mg/dL Final  . Calcium 09/19/2013 7.9* 8.4 - 10.5 mg/dL Final  . Total Protein 09/19/2013 6.0  6.0 - 8.3 g/dL Final  . Albumin 09/19/2013 3.4* 3.5 - 5.2 g/dL Final  . AST 09/19/2013 83* 0 - 37 U/L Final  . ALT 09/19/2013 42  0 - 53 U/L Final  . Alkaline Phosphatase 09/19/2013 50  39 - 117 U/L Final  . Total Bilirubin 09/19/2013 0.4  0.3 - 1.2 mg/dL Final  . GFR calc non Af Amer 09/19/2013 75* >90 mL/min Final  . GFR calc Af Amer 09/19/2013 87* >90 mL/min Final   Comment: (NOTE)                          The eGFR has been calculated using the CKD EPI equation.  This calculation has not been validated in all clinical situations.                          eGFR's persistently <90 mL/min signify possible Chronic Kidney                          Disease.  . Anion gap 09/19/2013 13  5 - 15 Final  . WBC 09/19/2013 3.8* 4.0 - 10.5 K/uL Final  . RBC 09/19/2013  3.99* 4.22 - 5.81 MIL/uL Final  . Hemoglobin 09/19/2013 10.1* 13.0 - 17.0 g/dL Final   Comment: DELTA CHECK NOTED                          REPEATED TO VERIFY  . HCT 09/19/2013 32.2* 39.0 - 52.0 % Final  . MCV 09/19/2013 80.7  78.0 - 100.0 fL Final  . MCH 09/19/2013 25.3* 26.0 - 34.0 pg Final  . MCHC 09/19/2013 31.4  30.0 - 36.0 g/dL Final  . RDW 09/19/2013 14.1  11.5 - 15.5 % Final  . Platelets 09/19/2013 101* 150 - 400 K/uL Final   PLATELET COUNT CONFIRMED BY SMEAR  . Prothrombin Time 09/19/2013 14.9  11.6 - 15.2 seconds Final  . INR 09/19/2013 1.17  0.00 - 1.49 Final  . Total CK 09/19/2013 3307* 7 - 232 U/L Final  . Ferritin 09/19/2013 13* 22 - 322 ng/mL Final   Performed at [facility]  . Iron 09/19/2013 41* 42 - 135 ug/dL Final  . TIBC 09/19/2013 294  215 - 435 ug/dL Final  . Saturation Ratios 09/19/2013 14* 20 - 55 % Final  . UIBC 09/19/2013 253  125 - 400 ug/dL Final   Performed at [facility]  . TSH 09/19/2013 4.810* 0.350 - 4.500 uIU/mL Final  . Glucose-Capillary 09/19/2013 71  70 - 99 mg/dL Final  . Glucose-Capillary 09/19/2013 71  70 - 99 mg/dL Final  . Sodium 09/20/2013 144  137 - 147 mEq/L Final  . Potassium 09/20/2013 3.6* 3.7 - 5.3 mEq/L Final  . Chloride 09/20/2013 112  96 - 112 mEq/L Final  . CO2 09/20/2013 21  19 - 32 mEq/L Final  . Glucose, Bld 09/20/2013 80  70 - 99 mg/dL Final  . BUN 09/20/2013 14  6 - 23 mg/dL Final  . Creatinine, Ser 09/20/2013 0.97  0.50 - 1.35 mg/dL Final  . Calcium 09/20/2013 6.9* 8.4 - 10.5 mg/dL Final  . GFR calc non Af Amer 09/20/2013 85* >90 mL/min Final  . GFR calc Af Amer 09/20/2013 >90  >90 mL/min Final   Comment: (NOTE)                          The eGFR has been calculated using the CKD EPI equation.                          This calculation has not been validated in all clinical situations.                          eGFR's persistently <90 mL/min signify possible Chronic Kidney                          Disease.    . Anion gap 09/20/2013 11  5 - 15 Final  . Total CK 09/20/2013 1835* 7 - 232 U/L Final  . Glucose-Capillary 09/19/2013 148* 70 - 99 mg/dL Final  . Glucose-Capillary 09/19/2013 121* 70 - 99 mg/dL Final  . Comment 1 09/19/2013 Notify RN   Final  . Glucose-Capillary 09/20/2013 65* 70 - 99 mg/dL Final  . Glucose-Capillary 09/20/2013 85  70 - 99 mg/dL Final  Admission on 09/18/2013, Discharged on 09/18/2013  Component Date Value Ref Range Status  . Sodium 09/18/2013 142  137 - 147 mEq/L Final  . Potassium 09/18/2013 3.8  3.7 - 5.3 mEq/L Final  . Chloride 09/18/2013 109  96 - 112 mEq/L Final  . BUN 09/18/2013 26* 6 - 23 mg/dL Final  . Creatinine, Ser 09/18/2013 1.40* 0.50 - 1.35 mg/dL Final  . Glucose, Bld 09/18/2013 97  70 - 99 mg/dL Final  . Calcium, Ion 09/18/2013 1.23  1.13 - 1.30 mmol/L Final  . TCO2 09/18/2013 23  0 - 100 mmol/L Final  . Hemoglobin 09/18/2013 12.9* 13.0 - 17.0 g/dL Final  . HCT 09/18/2013 38.0* 39.0 - 52.0 % Final  . Magnesium 09/18/2013 1.9  1.5 - 2.5 mg/dL Final  . Total CK 09/18/2013 1765* 7 - 232 U/L Final  Lab on 08/26/2013  Component Date Value Ref Range Status  . Hemoglobin A1C 08/26/2013 8.9* 4.8 - 5.6 % Final   Comment:          Increased risk for diabetes: 5.7 - 6.4                                   Diabetes: >6.4                                   Glycemic control for adults with diabetes: <7.0  . Estimated average glucose 08/26/2013 209   Final  . Glucose 08/26/2013 154* 65 - 99 mg/dL Final  . BUN 08/26/2013 17  8 - 27 mg/dL Final  . Creatinine, Ser 08/26/2013 1.14  0.76 - 1.27 mg/dL Final  . GFR calc non Af Amer 08/26/2013 68  >59 mL/min/1.73 Final  . GFR calc Af Amer 08/26/2013 78  >59 mL/min/1.73 Final  . BUN/Creatinine Ratio 08/26/2013 15  10 - 22 Final  . Sodium 08/26/2013 140  134 - 144 mmol/L Final  . Potassium 08/26/2013 5.4* 3.5 - 5.2 mmol/L Final  . Chloride 08/26/2013 101  97 - 108 mmol/L Final  . CO2 08/26/2013 25  18 - 29 mmol/L Final   . Calcium 08/26/2013 10.0  8.6 - 10.2 mg/dL Final  . Total Protein 08/26/2013 6.6  6.0 - 8.5 g/dL Final  . Albumin 08/26/2013 4.4  3.6 - 4.8 g/dL Final  . Globulin, Total 08/26/2013 2.2  1.5 - 4.5 g/dL Final  . Albumin/Globulin Ratio 08/26/2013 2.0  1.1 - 2.5 Final  . Total Bilirubin 08/26/2013 0.3  0.0 - 1.2 mg/dL Final  . Alkaline Phosphatase 08/26/2013 58  39 - 117 IU/L Final  . AST 08/26/2013 19  0 - 40 IU/L Final  . ALT 08/26/2013 24  0 - 44 IU/L Final  . Cholesterol, Total 08/26/2013 114  100 - 199 mg/dL Final  . Triglycerides 08/26/2013 168* 0 - 149 mg/dL Final  . HDL 08/26/2013 34* >39 mg/dL Final   Comment: According to ATP-III Guidelines, HDL-C >59 mg/dL is considered a  negative risk factor for CHD.  Marland Kitchen VLDL Cholesterol Cal 08/26/2013 34  5 - 40 mg/dL Final  . LDL Calculated 08/26/2013 46  0 - 99 mg/dL Final  . Chol/HDL Ratio 08/26/2013 3.4  0.0 - 5.0 ratio units Final   Comment:                                   T. Chol/HDL Ratio                                                                      Men  Women                                                        1/2 Avg.Risk  3.4    3.3                                                            Avg.Risk  5.0    4.4                                                         2X Avg.Risk  9.6    7.1                                                         3X Avg.Risk 23.4   11.0  . Creatinine, Ur 08/26/2013 29.5  22.0 - 328.0 mg/dL Final  . Microalbum.,U,Random 08/26/2013 <3.0  0.0 - 17.0 ug/mL Final  . MICROALB/CREAT RATIO 08/26/2013 <10.2  0.0 - 30.0 mg/g creat Final      Assessment/Plan  1. Restless legs syndrome (RLS): scheduled to see neurologist in 1 week - Comprehensive metabolic panel - carbidopa-levodopa (SINEMET) 10-100 MG per tablet; One three times daily to help restless legs  Dispense: 100 tablet; Refill: 3  2. Type II or unspecified type diabetes mellitus with peripheral circulatory  disorders, uncontrolled(250.72) - Comprehensive metabolic panel  3. Hypertension Mild elevation of SBP - Comprehensive metabolic panel  4. Elevated CPK No known trauma - CK - Sedimentation rate  5. Non-traumatic rhabdomyolysis No known trauma. Current drugs seem unlikely to be associated with this  6. Iron deficiency anemia Unknown etiology. May require more GI testing, but his problems with his legs are dominating his concerns and I do not think he could endure endoscopic procedures until RLS is better controlled. - CBC With differential/Platelet - Reticulocytes - Ferritin - IBC panel

## 2013-09-23 LAB — COMPREHENSIVE METABOLIC PANEL
ALT: 40 IU/L (ref 0–44)
AST: 49 IU/L — ABNORMAL HIGH (ref 0–40)
Albumin/Globulin Ratio: 1.8 (ref 1.1–2.5)
Albumin: 4.1 g/dL (ref 3.6–4.8)
Alkaline Phosphatase: 57 IU/L (ref 39–117)
BUN/Creatinine Ratio: 13 (ref 10–22)
BUN: 14 mg/dL (ref 8–27)
CHLORIDE: 102 mmol/L (ref 97–108)
CO2: 25 mmol/L (ref 18–29)
Calcium: 8.9 mg/dL (ref 8.6–10.2)
Creatinine, Ser: 1.07 mg/dL (ref 0.76–1.27)
GFR calc Af Amer: 84 mL/min/{1.73_m2} (ref >59)
GFR calc non Af Amer: 73 mL/min/{1.73_m2} (ref >59)
GLUCOSE: 133 mg/dL — AB (ref 65–99)
Globulin, Total: 2.3 g/dL (ref 1.5–4.5)
POTASSIUM: 4.2 mmol/L (ref 3.5–5.2)
Sodium: 138 mmol/L (ref 134–144)
TOTAL PROTEIN: 6.4 g/dL (ref 6.0–8.5)
Total Bilirubin: 0.3 mg/dL (ref 0.0–1.2)

## 2013-09-23 LAB — CBC WITH DIFFERENTIAL
BASOS ABS: 0 10*3/uL (ref 0.0–0.2)
Basos: 0 %
EOS ABS: 0.1 10*3/uL (ref 0.0–0.4)
Eos: 2 %
HEMATOCRIT: 31.2 % — AB (ref 37.5–51.0)
Hemoglobin: 10.4 g/dL — ABNORMAL LOW (ref 12.6–17.7)
Immature Grans (Abs): 0 10*3/uL (ref 0.0–0.1)
Immature Granulocytes: 0 %
LYMPHS: 28 %
Lymphocytes Absolute: 1 10*3/uL (ref 0.7–3.1)
MCH: 25.9 pg — ABNORMAL LOW (ref 26.6–33.0)
MCHC: 33.3 g/dL (ref 31.5–35.7)
MCV: 78 fL — ABNORMAL LOW (ref 79–97)
Monocytes Absolute: 0.3 10*3/uL (ref 0.1–0.9)
Monocytes: 9 %
Neutrophils Absolute: 2.2 10*3/uL (ref 1.4–7.0)
Neutrophils Relative %: 61 %
PLATELETS: 106 10*3/uL — AB (ref 150–379)
RBC: 4.01 x10E6/uL — ABNORMAL LOW (ref 4.14–5.80)
RDW: 14.9 % (ref 12.3–15.4)
WBC: 3.7 10*3/uL (ref 3.4–10.8)

## 2013-09-23 LAB — CK: Total CK: 782 U/L (ref 24–204)

## 2013-09-23 LAB — RETICULOCYTES: RETIC CT PCT: 1.5 % (ref 0.6–2.6)

## 2013-09-23 LAB — FERRITIN: Ferritin: 32 ng/mL (ref 30–400)

## 2013-09-23 LAB — SEDIMENTATION RATE: SED RATE: 20 mm/h (ref 0–30)

## 2013-09-26 ENCOUNTER — Other Ambulatory Visit: Payer: Self-pay | Admitting: Internal Medicine

## 2013-09-29 ENCOUNTER — Ambulatory Visit (INDEPENDENT_AMBULATORY_CARE_PROVIDER_SITE_OTHER): Payer: BC Managed Care – PPO | Admitting: Neurology

## 2013-09-29 ENCOUNTER — Ambulatory Visit: Payer: BC Managed Care – PPO | Admitting: Internal Medicine

## 2013-09-29 ENCOUNTER — Encounter: Payer: Self-pay | Admitting: Neurology

## 2013-09-29 VITALS — BP 110/78 | Ht 67.0 in | Wt 184.7 lb

## 2013-09-29 DIAGNOSIS — G2581 Restless legs syndrome: Secondary | ICD-10-CM

## 2013-09-29 MED ORDER — CARBIDOPA-LEVODOPA ER 25-100 MG PO TBCR: 1.0000 | EXTENDED_RELEASE_TABLET | Freq: Every day | ORAL | Status: DC

## 2013-09-29 NOTE — Patient Instructions (Addendum)
1. Start Sinemet CR 25/100mg  1-2 hours before bedtime 2. Take Sinemet immediate release 10/100mg  at 6pm and as needed at night 3. Reduce caffeine intake 4. Continue iron supplements 5. Follow-up in 3 months, call our office for any problems

## 2013-09-29 NOTE — Progress Notes (Signed)
NEUROLOGY CONSULTATION NOTE  [name] Dawson MRN: [id number] DOB: 1950/12/05  Referring provider: Dr. Jeoffrey MassedShanker Ghimire Primary care provider: Dr. Murray HodgkinsArthur Green  Reason for consult:  Severe restless leg syndrome, hospital follow-up  Dear Dr Jerral RalphGhimire:  Thank you for your kind referral of [name] Creveling for consultation of the above symptoms. Although his history is well known to you, please allow me to reiterate it for the purpose of our medical record. The patient was accompanied to the clinic by his wife who also provides collateral information. Records and images were personally reviewed where available.  HISTORY OF PRESENT ILLNESS: This is a very pleasant 63 year old right-handed man with a history of diabetes, hyperlipidemia, CAD s/p PCI, COPD, and restless leg syndrome, who was admitted at [facility] 09/18/2013 for leg pain due to severe RLS, found to have rhabdomyolysis with CK of 4907.  He was admitted for hydration and pain management.  He has had RLS for at least 20 years, initially affecting his legs, however since 2004 his arms have been involved as well.  He has been taking Mirapex for at least 10 years, and had been takin 0.75mg  dose 1-2 tablets at night.  This dose was causing him drowsiness.  He saw his PCP on 07/14 due to worsening RLS symptoms. He was tried on Zanaflex and Mirapex was discontinued, which did not help.  He restarted Mirapex and had to take additional doses due to constant limb movements and pain, prompting him to go to the ER.  He was started on Gabapentin and clonazepam which did not help much, he was also given Dilaudid which quieted him down.  He was discharged home then returned to the ER due to persistently restless and painful legs bilaterally. He was described to be literally thrashing about the bed, able to answer questions but in significant distress. He tells me the symptoms would start in his right leg, leg would flex at the hip, then he stretches and leans back  because the leg is jerking so violently for up to 1-1/2 hours. This eases off then starts on his left leg.  Walking and moving around helps.  Symptoms improved with pain medication. He saw his PCP a week ago and reported leg swelling, ?due to gabapentin. He was started on Sinemet 10/100mg  TID, which her reports has helped with the RLS symptoms, however wears off in the middle of the night. He would take 1/2 tab Mirapex when Sinemet wears off.  Since starting Sinemet, he reports weird dreams but no other side effects.  He had discontinued gabapentin, clonazepam, and Zanaflex.    He has infrequent headaches, dyspnea on exertion (climbing steps), occasional choking. Otherwise he denies any diplopia, dysarthria, focal numbness/tingling, bowel/bladder dysfunction.  He has a history of C1 fracture at age 63 or [age]. No known family history of similar RLS because he was adopted.    Laboratory Data: Lab Results  Component Value Date   WBC 3.7 09/22/2013   HGB 10.4* 09/22/2013   HCT 31.2* 09/22/2013   MCV 78* 09/22/2013   PLT 106* 09/22/2013     Chemistry      Component Value Date/Time   NA 138 09/22/2013 1330   NA 142 09/21/2013 0530   K 4.2 09/22/2013 1330   CL 102 09/22/2013 1330   CO2 25 09/22/2013 1330   BUN 14 09/22/2013 1330   BUN 15 09/21/2013 0530   CREATININE 1.07 09/22/2013 1330      Component Value Date/Time   CALCIUM  8.9 09/22/2013 1330   ALKPHOS 57 09/22/2013 1330   AST 49* 09/22/2013 1330   ALT 40 09/22/2013 1330   BILITOT 0.3 09/22/2013 1330     Lab Results  Component Value Date   FERRITIN 32 09/22/2013   Lab Results  Component Value Date   CKTOTAL 782* 09/22/2013   TROPONINI <0.30 03/27/2013    PAST MEDICAL HISTORY: Past Medical History  Diagnosis Date  . Restless leg   . Cervicalgia   . Loss of weight   . Impotence of organic origin   . Umbilical hernia without mention of obstruction or gangrene   . Barrett's esophagus   . Unspecified gastritis and gastroduodenitis with  hemorrhage   . Hyperlipidemia   . Other malaise and fatigue   . Hypoglycemia, unspecified   . GERD (gastroesophageal reflux disease)   . Complication of anesthesia     " i DO NOT WAKE UP VERY WELL "  . PONV (postoperative nausea and vomiting)   . Hypertension   . Heart murmur     "grew out of it" (12/15/2012)  . Chronic bronchitis     "not bad lately; used to get it 3-4 times/yr" (12/15/2012)  . IDDM (insulin dependent diabetes mellitus)   . H/O hiatal hernia   . Tension headache   . Headache     "weekly" (12/15/2012)  . COPD (chronic obstructive pulmonary disease)     PAST SURGICAL HISTORY: Past Surgical History  Procedure Laterality Date  . Knee surgery Right 1992 and 2000    "calcium deposits removed" (12/15/2012)  . Coronary angioplasty with stent placement  12/15/2012    "2 stents" (12/15/2012)  . Hernia repair  2010    "umbilical" (12/15/2012)  . Incision and drainage abscess Left 05/2007    "groin" (12/15/2012)    MEDICATIONS: Current Outpatient Prescriptions on File Prior to Visit  Medication Sig Dispense Refill  . acetaminophen (TYLENOL) 500 MG tablet Take 1,000-1,500 mg by mouth every 6 (six) hours as needed for headache.      Marland Kitchen aspirin 81 MG EC tablet Take 1 tablet (81 mg total) by mouth daily.  60 tablet  0  . carbidopa-levodopa (SINEMET) 10-100 MG per tablet One three times daily to help restless legs  100 tablet  3  . carvedilol (COREG) 6.25 MG tablet Take 0.5 tablets (3.125 mg total) by mouth 2 (two) times daily with a meal.  30 tablet  0  . ferrous sulfate 325 (65 FE) MG tablet Take 1 tablet (325 mg total) by mouth 2 (two) times daily with a meal.  60 tablet  0  . Fluticasone Furoate-Vilanterol (BREO ELLIPTA) 100-25 MCG/INH AEPB Inhale into the lungs once daily  1 each  4  . insulin glargine (LANTUS) 100 unit/mL SOPN Inject 20-26 Units into the skin 2 (two) times daily. 20 units in the morning and 26 units in the evening.      . nitroGLYCERIN (NITROSTAT) 0.4  MG SL tablet Place 0.4 mg under the tongue every 5 (five) minutes as needed for chest pain.      . pantoprazole (PROTONIX) 40 MG tablet Take 40 mg by mouth 2 (two) times daily.      . pantoprazole (PROTONIX) 40 MG tablet TAKE 1 TABLET (40 MG TOTAL) BY MOUTH 2 (TWO) TIMES DAILY.  60 tablet  5  . prasugrel (EFFIENT) 10 MG TABS tablet Take 1 tablet (10 mg total) by mouth daily.  30 tablet  0  . senna (SENOKOT) 8.6 MG TABS  tablet Take 1 tablet (8.6 mg total) by mouth daily.  30 each  0   No current facility-administered medications on file prior to visit.    ALLERGIES: No Known Allergies  FAMILY HISTORY: History reviewed. No pertinent family history.  SOCIAL HISTORY: History   Social History  . Marital Status: Married    Spouse Name: N/A    Number of Children: N/A  . Years of Education: N/A   Occupational History  . Not on file.   Social History Main Topics  . Smoking status: Never Smoker   . Smokeless tobacco: Never Used  . Alcohol Use: No  . Drug Use: No  . Sexual Activity: Not Currently   Other Topics Concern  . Not on file   Social History Narrative  . No narrative on file    REVIEW OF SYSTEMS: Constitutional: No fevers, chills, or sweats, no generalized fatigue, change in appetite Eyes: No visual changes, double vision, eye pain Ear, nose and throat: No hearing loss, ear pain, nasal congestion, sore throat Cardiovascular: No chest pain, palpitations Respiratory:  No shortness of breath at rest or with exertion, wheezes GastrointestinaI: No nausea, vomiting, diarrhea, abdominal pain, fecal incontinence Genitourinary:  No dysuria, urinary retention or frequency Musculoskeletal:  No neck pain, back pain Integumentary: No rash, pruritus, skin lesions Neurological: as above Psychiatric: No depression, insomnia, anxiety Endocrine: No palpitations, fatigue, diaphoresis, mood swings, change in appetite, change in weight, increased thirst Hematologic/Lymphatic:  No  anemia, purpura, petechiae. Allergic/Immunologic: no itchy/runny eyes, nasal congestion, recent allergic reactions, rashes  PHYSICAL EXAM: Filed Vitals:   09/29/13 1448  BP: 110/78   General: No acute distress. Patient sitting comfortably, no RLS symptoms in office today Head:  Normocephalic/atraumatic Eyes: Fundoscopic exam shows bilateral sharp discs, no vessel changes, exudates, or hemorrhages Neck: supple, no paraspinal tenderness, full range of motion Back: No paraspinal tenderness Heart: regular rate and rhythm Lungs: Clear to auscultation bilaterally. Vascular: No carotid bruits. Skin/Extremities: No rash, no edema Neurological Exam: Mental status: alert and oriented to person, place, and time, no dysarthria or aphasia, Fund of knowledge is appropriate.  Recent and remote memory are intact.  Attention and concentration are normal.    Able to name objects and repeat phrases. Cranial nerves: CN I: not tested CN II: pupils equal, round and reactive to light, visual fields intact, fundi unremarkable. CN III, IV, VI:  full range of motion, no nystagmus, no ptosis CN V: facial sensation intact CN VII: upper and lower face symmetric CN VIII: hearing intact to finger rub CN IX, X: gag intact, uvula midline CN XI: sternocleidomastoid and trapezius muscles intact CN XII: tongue midline Bulk & Tone: normal, no fasciculations. Motor: 5/5 throughout with no pronator drift. Sensation: intact to light touch, cold, pin, vibration and joint position sense.  No extinction to double simultaneous stimulation.  Romberg test negative Deep Tendon Reflexes: +2 throughout, no ankle clonus Plantar responses: downgoing bilaterally Cerebellar: no incoordination on finger to nose, heel to shin. No dysdiadochokinesia Gait: narrow-based and steady, able to tandem walk adequately. Tremor: none  IMPRESSION: This is a pleasant 63 year old righ-handed man with a history of diabetes, hyperlipidemia, CAD  s/p PCI, COPD, and restless leg syndrome, who was admitted at [facility] last 09/18/2013 for leg pain due to severe RLS, found to have rhabdomyolysis with CK of 4907.  CK is trending down, the etiology of rhabdomylosis is unclear, presumably due to significant muscle movements and leg pain with the RLS, unlikely due to Mirapex  or statin, however these have been discontinued. He is now on Sinemet TID which has helped with his symptoms however wears off at night.  He will continue to take the daytime doses of Sinemet, however will take controlled-release Sinemet 25/100 1-2 hours prior to bedtime and monitor if this helps with wearing off, at which point he may take the third dose of immediate-release Sinemet. He will stop taking the prn doses of Mirapex.  Continue to monitor CK.  We discussed other options for RLS if Sinemet becomes ineffective, including clonazepam and Tramadol. He will reduce caffeine intake and continue iron supplements. He will follow-up in 3 months and knows to call our office for any problems in the interim.  Thank you for allowing me to participate in the care of this patient. Please do not hesitate to call for any questions or concerns.   Patrcia Dolly, M.D.  CC: [name]

## 2013-10-04 ENCOUNTER — Encounter: Payer: Self-pay | Admitting: Neurology

## 2013-10-05 ENCOUNTER — Ambulatory Visit: Payer: BC Managed Care – PPO | Admitting: Internal Medicine

## 2013-10-05 DIAGNOSIS — Z0289 Encounter for other administrative examinations: Secondary | ICD-10-CM

## 2013-10-14 ENCOUNTER — Ambulatory Visit: Payer: BC Managed Care – PPO | Admitting: Neurology

## 2013-10-17 ENCOUNTER — Other Ambulatory Visit: Payer: Self-pay | Admitting: Internal Medicine

## 2013-10-25 ENCOUNTER — Other Ambulatory Visit: Payer: Self-pay | Admitting: Internal Medicine

## 2013-11-29 ENCOUNTER — Other Ambulatory Visit: Payer: BC Managed Care – PPO

## 2013-11-29 DIAGNOSIS — E1159 Type 2 diabetes mellitus with other circulatory complications: Secondary | ICD-10-CM

## 2013-11-29 DIAGNOSIS — E785 Hyperlipidemia, unspecified: Secondary | ICD-10-CM

## 2013-11-29 DIAGNOSIS — D649 Anemia, unspecified: Secondary | ICD-10-CM

## 2013-11-29 LAB — HM DIABETES EYE EXAM

## 2013-11-30 LAB — CBC WITH DIFFERENTIAL
BASOS ABS: 0 10*3/uL (ref 0.0–0.2)
Basos: 0 %
EOS: 2 %
Eosinophils Absolute: 0.1 10*3/uL (ref 0.0–0.4)
HCT: 42.2 % (ref 37.5–51.0)
Hemoglobin: 13.9 g/dL (ref 12.6–17.7)
IMMATURE GRANS (ABS): 0 10*3/uL (ref 0.0–0.1)
IMMATURE GRANULOCYTES: 0 %
LYMPHS ABS: 1.5 10*3/uL (ref 0.7–3.1)
LYMPHS: 32 %
MCH: 27.9 pg (ref 26.6–33.0)
MCHC: 32.9 g/dL (ref 31.5–35.7)
MCV: 85 fL (ref 79–97)
Monocytes Absolute: 0.4 10*3/uL (ref 0.1–0.9)
Monocytes: 8 %
NEUTROS PCT: 58 %
Neutrophils Absolute: 2.8 10*3/uL (ref 1.4–7.0)
Platelets: 125 10*3/uL — ABNORMAL LOW (ref 150–379)
RBC: 4.99 x10E6/uL (ref 4.14–5.80)
RDW: 16.7 % — AB (ref 12.3–15.4)
WBC: 4.7 10*3/uL (ref 3.4–10.8)

## 2013-11-30 LAB — COMPREHENSIVE METABOLIC PANEL
A/G RATIO: 1.9 (ref 1.1–2.5)
ALT: 23 IU/L (ref 0–44)
AST: 17 IU/L (ref 0–40)
Albumin: 4.4 g/dL (ref 3.6–4.8)
Alkaline Phosphatase: 58 IU/L (ref 39–117)
BUN/Creatinine Ratio: 14 (ref 10–22)
BUN: 15 mg/dL (ref 8–27)
CALCIUM: 9.5 mg/dL (ref 8.6–10.2)
CO2: 26 mmol/L (ref 18–29)
Chloride: 101 mmol/L (ref 97–108)
Creatinine, Ser: 1.11 mg/dL (ref 0.76–1.27)
GFR calc Af Amer: 81 mL/min/{1.73_m2} (ref >59)
GFR calc non Af Amer: 70 mL/min/{1.73_m2} (ref >59)
Globulin, Total: 2.3 g/dL (ref 1.5–4.5)
Glucose: 139 mg/dL — ABNORMAL HIGH (ref 65–99)
POTASSIUM: 4.7 mmol/L (ref 3.5–5.2)
SODIUM: 141 mmol/L (ref 134–144)
Total Bilirubin: 0.5 mg/dL (ref 0.0–1.2)
Total Protein: 6.7 g/dL (ref 6.0–8.5)

## 2013-11-30 LAB — LIPID PANEL
CHOL/HDL RATIO: 4.9 ratio (ref 0.0–5.0)
Cholesterol, Total: 167 mg/dL (ref 100–199)
HDL: 34 mg/dL — AB (ref >39)
LDL CALC: 100 mg/dL — AB (ref 0–99)
TRIGLYCERIDES: 167 mg/dL — AB (ref 0–149)
VLDL Cholesterol Cal: 33 mg/dL (ref 5–40)

## 2013-11-30 LAB — HEMOGLOBIN A1C
Est. average glucose Bld gHb Est-mCnc: 186 mg/dL
Hgb A1c MFr Bld: 8.1 % — ABNORMAL HIGH (ref 4.8–5.6)

## 2013-11-30 LAB — RETICULOCYTES: RETIC CT PCT: 1 % (ref 0.6–2.6)

## 2013-11-30 LAB — FERRITIN: FERRITIN: 26 ng/mL — AB (ref 30–400)

## 2013-12-01 ENCOUNTER — Encounter: Payer: Self-pay | Admitting: Internal Medicine

## 2013-12-01 ENCOUNTER — Ambulatory Visit (INDEPENDENT_AMBULATORY_CARE_PROVIDER_SITE_OTHER): Payer: BC Managed Care – PPO | Admitting: Internal Medicine

## 2013-12-01 VITALS — BP 130/78 | HR 66 | Temp 98.8°F | Resp 18 | Ht 67.0 in | Wt 186.4 lb

## 2013-12-01 DIAGNOSIS — Z23 Encounter for immunization: Secondary | ICD-10-CM

## 2013-12-01 DIAGNOSIS — I10 Essential (primary) hypertension: Secondary | ICD-10-CM

## 2013-12-01 DIAGNOSIS — G2581 Restless legs syndrome: Secondary | ICD-10-CM

## 2013-12-01 DIAGNOSIS — D509 Iron deficiency anemia, unspecified: Secondary | ICD-10-CM

## 2013-12-01 DIAGNOSIS — R748 Abnormal levels of other serum enzymes: Secondary | ICD-10-CM

## 2013-12-01 DIAGNOSIS — IMO0001 Reserved for inherently not codable concepts without codable children: Secondary | ICD-10-CM

## 2013-12-01 DIAGNOSIS — E1159 Type 2 diabetes mellitus with other circulatory complications: Secondary | ICD-10-CM

## 2013-12-01 DIAGNOSIS — E785 Hyperlipidemia, unspecified: Secondary | ICD-10-CM

## 2013-12-01 DIAGNOSIS — R0602 Shortness of breath: Secondary | ICD-10-CM

## 2013-12-01 DIAGNOSIS — E1165 Type 2 diabetes mellitus with hyperglycemia: Secondary | ICD-10-CM

## 2013-12-01 NOTE — Progress Notes (Signed)
Patient ID: [name], male   DOB: 1950-11-09, 63 y.o.   MRN: [id number]      PAM    Place of Service:   OFFICE    No Known Allergies  Chief Complaint  Patient presents with  . Medical Management of Chronic Issues    HPI:  Type II or unspecified type diabetes mellitus with peripheral circulatory disorders, uncontrolled(250.72): Improving on Lantus  Hypertension: stable  Restless legs syndrome (RLS): Improved  Shortness of breath: Resolved, reports is able to climb 6 flights of stairs without dyspnea  Hyperlipidemia - controlled  Elevated CPK -follow CK  Iron deficiency anemia - Improving,    Medications: Patient's Medications  New Prescriptions   No medications on file  Previous Medications   ACETAMINOPHEN (TYLENOL) 500 MG TABLET    Take 1,000-1,500 mg by mouth every 6 (six) hours as needed for headache.   ASPIRIN 81 MG EC TABLET    Take 1 tablet (81 mg total) by mouth daily.   CARBIDOPA-LEVODOPA (SINEMET) 10-100 MG PER TABLET    One three times daily to help restless legs   CARBIDOPA-LEVODOPA ER (SINEMET CR) 25-100 MG TABLET CONTROLLED RELEASE    Take 1 tablet by mouth at bedtime.   CARVEDILOL (COREG) 6.25 MG TABLET    Take 0.5 tablets (3.125 mg total) by mouth 2 (two) times daily with a meal.   INSULIN GLARGINE (LANTUS) 100 UNIT/ML SOPN    Inject 20-26 Units into the skin 2 (two) times daily. 20 units in the morning and 26 units in the evening.   NITROGLYCERIN (NITROSTAT) 0.4 MG SL TABLET    Place 0.4 mg under the tongue every 5 (five) minutes as needed for chest pain.   PANTOPRAZOLE (PROTONIX) 40 MG TABLET    Take 40 mg by mouth 2 (two) times daily.   PANTOPRAZOLE (PROTONIX) 40 MG TABLET    TAKE 1 TABLET (40 MG TOTAL) BY MOUTH 2 (TWO) TIMES DAILY.   PRASUGREL (EFFIENT) 10 MG TABS TABLET    Take 1 tablet (10 mg total) by mouth daily.  Modified Medications   No medications on file  Discontinued Medications   FERROUS SULFATE 325 (65 FE) MG TABLET    Take 1 tablet  (325 mg total) by mouth 2 (two) times daily with a meal.   FLUTICASONE FUROATE-VILANTEROL (BREO ELLIPTA) 100-25 MCG/INH AEPB    Inhale into the lungs once daily   SENNA (SENOKOT) 8.6 MG TABS TABLET    Take 1 tablet (8.6 mg total) by mouth daily.     Review of Systems  Constitutional: Negative for fever and fatigue.  HENT: Positive for hearing loss. Negative for congestion and ear pain.   Eyes: Negative.        Wears prescription lenses.  Respiratory: Negative for cough, choking, chest tightness, shortness of breath (with exertion) and wheezing.   Cardiovascular: Negative for chest pain, palpitations and leg swelling.  Gastrointestinal: Negative for nausea, vomiting, abdominal pain, diarrhea, constipation and abdominal distention.       Barrett's esophagus and reflux history  Endocrine: Negative for cold intolerance, heat intolerance, polydipsia, polyphagia and polyuria.       Diabetes under poor control.  Genitourinary: Negative.   Musculoskeletal: Positive for arthralgias, back pain, gait problem and joint swelling. Negative for myalgias, neck pain and neck stiffness.  Skin: Negative.   Allergic/Immunologic: Negative.   Neurological: Positive for weakness, numbness (to bilateral feet, with  restless legs) and headaches. Negative for dizziness, tremors, seizures, syncope, facial asymmetry, speech  difficulty and light-headedness.       Severe restless legs  Hematological: Bruises/bleeds easily.       Iron deficiency anemia without obvious blood loss.  Psychiatric/Behavioral: The patient is nervous/anxious.     Filed Vitals:   12/01/13 1218  BP: 130/78  Pulse: 66  Temp: 98.8 F (37.1 C)  TempSrc: Oral  Resp: 18  Height: 5' 7"  (1.702 m)  Weight: 186 lb 6.4 oz (84.55 kg)  SpO2: 94%   Body mass index is 29.19 kg/(m^2).  Physical Exam  Constitutional: He is oriented to person, place, and time. He appears well-developed and well-nourished. No distress.  overweight  HENT:  Head:  Normocephalic and atraumatic.  Right Ear: External ear normal.  Left Ear: External ear normal.  Nose: Nose normal.  Mouth/Throat: Oropharynx is clear and moist.  Eyes: Conjunctivae and EOM are normal. Pupils are equal, round, and reactive to light.  Neck: Normal range of motion. Neck supple. No JVD present. No tracheal deviation present. No thyromegaly present.  Cardiovascular: Normal rate, regular rhythm, normal heart sounds and intact distal pulses.  Exam reveals no gallop and no friction rub.   No murmur heard. Pulmonary/Chest: Effort normal. No respiratory distress. He has no wheezes. He has no rales. He exhibits no tenderness.  Abdominal: Soft. Bowel sounds are normal. He exhibits no distension and no mass.  Musculoskeletal: He exhibits no edema and no tenderness.  Bilateral shoulder discomfort with some restriction of movement especially at the left shoulder. Painful to raise left arm. Painful to lift items and to rotate at left shoulder. Has been seen by Dr. Noemi Chapel. Chronic neck pains. Tender at insertion of the left Achilles tendon.  Lymphadenopathy:    He has no cervical adenopathy.  Neurological: He is alert and oriented to person, place, and time. He has normal reflexes. No cranial nerve deficit. Coordination normal.  Bilateral restless legs.  Skin: Skin is warm and dry. No rash noted. No erythema. No pallor.  Psychiatric: He has a normal mood and affect. His behavior is normal. Judgment and thought content normal.     Labs reviewed: Appointment on 11/29/2013  Component Date Value Ref Range Status  . Hemoglobin A1C 11/29/2013 8.1* 4.8 - 5.6 % Final   Comment:          Increased risk for diabetes: 5.7 - 6.4                                   Diabetes: >6.4                                   Glycemic control for adults with diabetes: <7.0  . Estimated average glucose 11/29/2013 186   Final  . Cholesterol, Total 11/29/2013 167  100 - 199 mg/dL Final  . Triglycerides 11/29/2013  167* 0 - 149 mg/dL Final  . HDL 11/29/2013 34* >39 mg/dL Final   Comment: According to ATP-III Guidelines, HDL-C >59 mg/dL is considered a                          negative risk factor for CHD.  Marland Kitchen VLDL Cholesterol Cal 11/29/2013 33  5 - 40 mg/dL Final  . LDL Calculated 11/29/2013 100* 0 - 99 mg/dL Final  . Chol/HDL Ratio 11/29/2013 4.9  0.0 - 5.0 ratio units Final  Comment:                                   T. Chol/HDL Ratio                                                                      Men  Women                                                        1/2 Avg.Risk  3.4    3.3                                                            Avg.Risk  5.0    4.4                                                         2X Avg.Risk  9.6    7.1                                                         3X Avg.Risk 23.4   11.0  . Glucose 11/29/2013 139* 65 - 99 mg/dL Final  . BUN 11/29/2013 15  8 - 27 mg/dL Final  . Creatinine, Ser 11/29/2013 1.11  0.76 - 1.27 mg/dL Final  . GFR calc non Af Amer 11/29/2013 70  >59 mL/min/1.73 Final  . GFR calc Af Amer 11/29/2013 81  >59 mL/min/1.73 Final  . BUN/Creatinine Ratio 11/29/2013 14  10 - 22 Final  . Sodium 11/29/2013 141  134 - 144 mmol/L Final  . Potassium 11/29/2013 4.7  3.5 - 5.2 mmol/L Final  . Chloride 11/29/2013 101  97 - 108 mmol/L Final  . CO2 11/29/2013 26  18 - 29 mmol/L Final  . Calcium 11/29/2013 9.5  8.6 - 10.2 mg/dL Final  . Total Protein 11/29/2013 6.7  6.0 - 8.5 g/dL Final  . Albumin 11/29/2013 4.4  3.6 - 4.8 g/dL Final  . Globulin, Total 11/29/2013 2.3  1.5 - 4.5 g/dL Final  . Albumin/Globulin Ratio 11/29/2013 1.9  1.1 - 2.5 Final  . Total Bilirubin 11/29/2013 0.5  0.0 - 1.2 mg/dL Final  . Alkaline Phosphatase 11/29/2013 58  39 - 117 IU/L Final  . AST 11/29/2013 17  0 - 40 IU/L Final  . ALT 11/29/2013 23  0 - 44 IU/L Final  . Retic Ct Pct 11/29/2013 1.0  0.6 - 2.6 % Final  . Ferritin 11/29/2013 26* 30 -  400 ng/mL Final  . WBC  11/29/2013 4.7  3.4 - 10.8 x10E3/uL Final  . RBC 11/29/2013 4.99  4.14 - 5.80 x10E6/uL Final  . Hemoglobin 11/29/2013 13.9  12.6 - 17.7 g/dL Final  . HCT 11/29/2013 42.2  37.5 - 51.0 % Final  . MCV 11/29/2013 85  79 - 97 fL Final  . MCH 11/29/2013 27.9  26.6 - 33.0 pg Final  . MCHC 11/29/2013 32.9  31.5 - 35.7 g/dL Final  . RDW 11/29/2013 16.7* 12.3 - 15.4 % Final  . Platelets 11/29/2013 125* 150 - 379 x10E3/uL Final  . Neutrophils Relative % 11/29/2013 58   Final  . Lymphs 11/29/2013 32   Final  . Monocytes 11/29/2013 8   Final  . Eos 11/29/2013 2   Final  . Basos 11/29/2013 0   Final  . Neutrophils Absolute 11/29/2013 2.8  1.4 - 7.0 x10E3/uL Final  . Lymphocytes Absolute 11/29/2013 1.5  0.7 - 3.1 x10E3/uL Final  . Monocytes Absolute 11/29/2013 0.4  0.1 - 0.9 x10E3/uL Final  . Eosinophils Absolute 11/29/2013 0.1  0.0 - 0.4 x10E3/uL Final  . Basophils Absolute 11/29/2013 0.0  0.0 - 0.2 x10E3/uL Final  . Immature Granulocytes 11/29/2013 0   Final  . Immature Grans (Abs) 11/29/2013 0.0  0.0 - 0.1 x10E3/uL Final  Office Visit on 09/22/2013  Component Date Value Ref Range Status  . WBC 09/22/2013 3.7  3.4 - 10.8 x10E3/uL Final  . RBC 09/22/2013 4.01* 4.14 - 5.80 x10E6/uL Final  . Hemoglobin 09/22/2013 10.4* 12.6 - 17.7 g/dL Final  . HCT 09/22/2013 31.2* 37.5 - 51.0 % Final  . MCV 09/22/2013 78* 79 - 97 fL Final  . MCH 09/22/2013 25.9* 26.6 - 33.0 pg Final  . MCHC 09/22/2013 33.3  31.5 - 35.7 g/dL Final  . RDW 09/22/2013 14.9  12.3 - 15.4 % Final  . Platelets 09/22/2013 106* 150 - 379 x10E3/uL Final  . Neutrophils Relative % 09/22/2013 61   Final  . Lymphs 09/22/2013 28   Final  . Monocytes 09/22/2013 9   Final  . Eos 09/22/2013 2   Final  . Basos 09/22/2013 0   Final  . Neutrophils Absolute 09/22/2013 2.2  1.4 - 7.0 x10E3/uL Final  . Lymphocytes Absolute 09/22/2013 1.0  0.7 - 3.1 x10E3/uL Final  . Monocytes Absolute 09/22/2013 0.3  0.1 - 0.9 x10E3/uL Final  . Eosinophils Absolute  09/22/2013 0.1  0.0 - 0.4 x10E3/uL Final  . Basophils Absolute 09/22/2013 0.0  0.0 - 0.2 x10E3/uL Final  . Immature Granulocytes 09/22/2013 0   Final  . Immature Grans (Abs) 09/22/2013 0.0  0.0 - 0.1 x10E3/uL Final  . Glucose 09/22/2013 133* 65 - 99 mg/dL Final  . BUN 09/22/2013 14  8 - 27 mg/dL Final  . Creatinine, Ser 09/22/2013 1.07  0.76 - 1.27 mg/dL Final  . GFR calc non Af Amer 09/22/2013 73  >59 mL/min/1.73 Final  . GFR calc Af Amer 09/22/2013 84  >59 mL/min/1.73 Final  . BUN/Creatinine Ratio 09/22/2013 13  10 - 22 Final  . Sodium 09/22/2013 138  134 - 144 mmol/L Final  . Potassium 09/22/2013 4.2  3.5 - 5.2 mmol/L Final  . Chloride 09/22/2013 102  97 - 108 mmol/L Final  . CO2 09/22/2013 25  18 - 29 mmol/L Final  . Calcium 09/22/2013 8.9  8.6 - 10.2 mg/dL Final  . Total Protein 09/22/2013 6.4  6.0 - 8.5 g/dL Final  . Albumin 09/22/2013 4.1  3.6 - 4.8  g/dL Final  . Globulin, Total 09/22/2013 2.3  1.5 - 4.5 g/dL Final  . Albumin/Globulin Ratio 09/22/2013 1.8  1.1 - 2.5 Final  . Total Bilirubin 09/22/2013 0.3  0.0 - 1.2 mg/dL Final  . Alkaline Phosphatase 09/22/2013 57  39 - 117 IU/L Final  . AST 09/22/2013 49* 0 - 40 IU/L Final  . ALT 09/22/2013 40  0 - 44 IU/L Final  . Retic Ct Pct 09/22/2013 1.5  0.6 - 2.6 % Final  . Ferritin 09/22/2013 32  30 - 400 ng/mL Final  . Total CK 09/22/2013 782* 24 - 204 U/L Final  . Sed Rate 09/22/2013 20  0 - 30 mm/hr Final  Admission on 09/21/2013, Discharged on 09/21/2013  Component Date Value Ref Range Status  . Glucose-Capillary 09/21/2013 60* 70 - 99 mg/dL Final  . Troponin i, poc 09/21/2013 0.02  0.00 - 0.08 ng/mL Final  . Comment 3 09/21/2013          Final   Comment: Due to the release kinetics of cTnI,                          a negative result within the first hours                          of the onset of symptoms does not rule out                          myocardial infarction with certainty.                          If myocardial  infarction is still suspected,                          repeat the test at appropriate intervals.  . Sodium 09/21/2013 142  137 - 147 mEq/L Final  . Potassium 09/21/2013 3.2* 3.7 - 5.3 mEq/L Final  . Chloride 09/21/2013 107  96 - 112 mEq/L Final  . CO2 09/21/2013 22  19 - 32 mEq/L Final  . Glucose, Bld 09/21/2013 62* 70 - 99 mg/dL Final  . BUN 09/21/2013 15  6 - 23 mg/dL Final  . Creatinine, Ser 09/21/2013 1.01  0.50 - 1.35 mg/dL Final  . Calcium 09/21/2013 9.0  8.4 - 10.5 mg/dL Final  . Total Protein 09/21/2013 6.8  6.0 - 8.3 g/dL Final  . Albumin 09/21/2013 3.8  3.5 - 5.2 g/dL Final  . AST 09/21/2013 73* 0 - 37 U/L Final  . ALT 09/21/2013 49  0 - 53 U/L Final  . Alkaline Phosphatase 09/21/2013 54  39 - 117 U/L Final  . Total Bilirubin 09/21/2013 0.3  0.3 - 1.2 mg/dL Final  . GFR calc non Af Amer 09/21/2013 77* >90 mL/min Final  . GFR calc Af Amer 09/21/2013 89* >90 mL/min Final   Comment: (NOTE)                          The eGFR has been calculated using the CKD EPI equation.                          This calculation has not been validated in all clinical situations.  eGFR's persistently <90 mL/min signify possible Chronic Kidney                          Disease.  . Anion gap 09/21/2013 13  5 - 15 Final  . Total CK 09/21/2013 2221* 7 - 232 U/L Final  . Magnesium 09/21/2013 2.0  1.5 - 2.5 mg/dL Final  . Phosphorus 09/21/2013 3.2  2.3 - 4.6 mg/dL Final  . WBC 09/21/2013 4.6  4.0 - 10.5 K/uL Final  . RBC 09/21/2013 4.12* 4.22 - 5.81 MIL/uL Final  . Hemoglobin 09/21/2013 10.6* 13.0 - 17.0 g/dL Final  . HCT 09/21/2013 32.6* 39.0 - 52.0 % Final  . MCV 09/21/2013 79.1  78.0 - 100.0 fL Final  . MCH 09/21/2013 25.7* 26.0 - 34.0 pg Final  . MCHC 09/21/2013 32.5  30.0 - 36.0 g/dL Final  . RDW 09/21/2013 14.0  11.5 - 15.5 % Final  . Platelets 09/21/2013 117* 150 - 400 K/uL Final   CONSISTENT WITH PREVIOUS RESULT  . Color, Urine 09/21/2013 YELLOW  YELLOW Final  .  APPearance 09/21/2013 CLEAR  CLEAR Final  . Specific Gravity, Urine 09/21/2013 1.014  1.005 - 1.030 Final  . pH 09/21/2013 6.0  5.0 - 8.0 Final  . Glucose, UA 09/21/2013 NEGATIVE  NEGATIVE mg/dL Final  . Hgb urine dipstick 09/21/2013 NEGATIVE  NEGATIVE Final  . Bilirubin Urine 09/21/2013 NEGATIVE  NEGATIVE Final  . Ketones, ur 09/21/2013 NEGATIVE  NEGATIVE mg/dL Final  . Protein, ur 09/21/2013 NEGATIVE  NEGATIVE mg/dL Final  . Urobilinogen, UA 09/21/2013 0.2  0.0 - 1.0 mg/dL Final  . Nitrite 09/21/2013 NEGATIVE  NEGATIVE Final  . Leukocytes, UA 09/21/2013 NEGATIVE  NEGATIVE Final   MICROSCOPIC NOT DONE ON URINES WITH NEGATIVE PROTEIN, BLOOD, LEUKOCYTES, NITRITE, OR GLUCOSE <1000 mg/dL.  Marland Kitchen Glucose-Capillary 09/21/2013 110* 70 - 99 mg/dL Final  Admission on 09/18/2013, Discharged on 09/20/2013  Component Date Value Ref Range Status  . Total CK 09/18/2013 4907* 7 - 232 U/L Final  . WBC 09/18/2013 6.5  4.0 - 10.5 K/uL Final  . RBC 09/18/2013 4.57  4.22 - 5.81 MIL/uL Final  . Hemoglobin 09/18/2013 11.4* 13.0 - 17.0 g/dL Final  . HCT 09/18/2013 35.7* 39.0 - 52.0 % Final  . MCV 09/18/2013 78.1  78.0 - 100.0 fL Final  . MCH 09/18/2013 24.9* 26.0 - 34.0 pg Final  . MCHC 09/18/2013 31.9  30.0 - 36.0 g/dL Final  . RDW 09/18/2013 13.8  11.5 - 15.5 % Final  . Platelets 09/18/2013 131* 150 - 400 K/uL Final  . Sodium 09/18/2013 140  137 - 147 mEq/L Final  . Potassium 09/18/2013 3.9  3.7 - 5.3 mEq/L Final  . Chloride 09/18/2013 105  96 - 112 mEq/L Final  . BUN 09/18/2013 20  6 - 23 mg/dL Final  . Creatinine, Ser 09/18/2013 1.20  0.50 - 1.35 mg/dL Final  . Glucose, Bld 09/18/2013 85  70 - 99 mg/dL Final  . Calcium, Ion 09/18/2013 1.17  1.13 - 1.30 mmol/L Final  . TCO2 09/18/2013 21  0 - 100 mmol/L Final  . Hemoglobin 09/18/2013 12.6* 13.0 - 17.0 g/dL Final  . HCT 09/18/2013 37.0* 39.0 - 52.0 % Final  . Sodium 09/19/2013 142  137 - 147 mEq/L Final  . Potassium 09/19/2013 4.1  3.7 - 5.3 mEq/L Final    . Chloride 09/19/2013 106  96 - 112 mEq/L Final  . CO2 09/19/2013 23  19 - 32 mEq/L Final  .  Glucose, Bld 09/19/2013 97  70 - 99 mg/dL Final  . BUN 09/19/2013 17  6 - 23 mg/dL Final  . Creatinine, Ser 09/19/2013 1.03  0.50 - 1.35 mg/dL Final  . Calcium 09/19/2013 7.9* 8.4 - 10.5 mg/dL Final  . Total Protein 09/19/2013 6.0  6.0 - 8.3 g/dL Final  . Albumin 09/19/2013 3.4* 3.5 - 5.2 g/dL Final  . AST 09/19/2013 83* 0 - 37 U/L Final  . ALT 09/19/2013 42  0 - 53 U/L Final  . Alkaline Phosphatase 09/19/2013 50  39 - 117 U/L Final  . Total Bilirubin 09/19/2013 0.4  0.3 - 1.2 mg/dL Final  . GFR calc non Af Amer 09/19/2013 75* >90 mL/min Final  . GFR calc Af Amer 09/19/2013 87* >90 mL/min Final   Comment: (NOTE)                          The eGFR has been calculated using the CKD EPI equation.                          This calculation has not been validated in all clinical situations.                          eGFR's persistently <90 mL/min signify possible Chronic Kidney                          Disease.  . Anion gap 09/19/2013 13  5 - 15 Final  . WBC 09/19/2013 3.8* 4.0 - 10.5 K/uL Final  . RBC 09/19/2013 3.99* 4.22 - 5.81 MIL/uL Final  . Hemoglobin 09/19/2013 10.1* 13.0 - 17.0 g/dL Final   Comment: DELTA CHECK NOTED                          REPEATED TO VERIFY  . HCT 09/19/2013 32.2* 39.0 - 52.0 % Final  . MCV 09/19/2013 80.7  78.0 - 100.0 fL Final  . MCH 09/19/2013 25.3* 26.0 - 34.0 pg Final  . MCHC 09/19/2013 31.4  30.0 - 36.0 g/dL Final  . RDW 09/19/2013 14.1  11.5 - 15.5 % Final  . Platelets 09/19/2013 101* 150 - 400 K/uL Final   PLATELET COUNT CONFIRMED BY SMEAR  . Prothrombin Time 09/19/2013 14.9  11.6 - 15.2 seconds Final  . INR 09/19/2013 1.17  0.00 - 1.49 Final  . Total CK 09/19/2013 3307* 7 - 232 U/L Final  . Ferritin 09/19/2013 13* 22 - 322 ng/mL Final   Performed at [facility]  . Iron 09/19/2013 41* 42 - 135 ug/dL Final  . TIBC 09/19/2013 294  215 - 435 ug/dL  Final  . Saturation Ratios 09/19/2013 14* 20 - 55 % Final  . UIBC 09/19/2013 253  125 - 400 ug/dL Final   Performed at [facility]  . TSH 09/19/2013 4.810* 0.350 - 4.500 uIU/mL Final  . Glucose-Capillary 09/19/2013 71  70 - 99 mg/dL Final  . Glucose-Capillary 09/19/2013 71  70 - 99 mg/dL Final  . Sodium 09/20/2013 144  137 - 147 mEq/L Final  . Potassium 09/20/2013 3.6* 3.7 - 5.3 mEq/L Final  . Chloride 09/20/2013 112  96 - 112 mEq/L Final  . CO2 09/20/2013 21  19 - 32 mEq/L Final  . Glucose, Bld 09/20/2013 80  70 - 99 mg/dL Final  .  BUN 09/20/2013 14  6 - 23 mg/dL Final  . Creatinine, Ser 09/20/2013 0.97  0.50 - 1.35 mg/dL Final  . Calcium 09/20/2013 6.9* 8.4 - 10.5 mg/dL Final  . GFR calc non Af Amer 09/20/2013 85* >90 mL/min Final  . GFR calc Af Amer 09/20/2013 >90  >90 mL/min Final   Comment: (NOTE)                          The eGFR has been calculated using the CKD EPI equation.                          This calculation has not been validated in all clinical situations.                          eGFR's persistently <90 mL/min signify possible Chronic Kidney                          Disease.  . Anion gap 09/20/2013 11  5 - 15 Final  . Total CK 09/20/2013 1835* 7 - 232 U/L Final  . Glucose-Capillary 09/19/2013 148* 70 - 99 mg/dL Final  . Glucose-Capillary 09/19/2013 121* 70 - 99 mg/dL Final  . Comment 1 09/19/2013 Notify RN   Final  . Glucose-Capillary 09/20/2013 65* 70 - 99 mg/dL Final  . Glucose-Capillary 09/20/2013 85  70 - 99 mg/dL Final  Admission on 09/18/2013, Discharged on 09/18/2013  Component Date Value Ref Range Status  . Sodium 09/18/2013 142  137 - 147 mEq/L Final  . Potassium 09/18/2013 3.8  3.7 - 5.3 mEq/L Final  . Chloride 09/18/2013 109  96 - 112 mEq/L Final  . BUN 09/18/2013 26* 6 - 23 mg/dL Final  . Creatinine, Ser 09/18/2013 1.40* 0.50 - 1.35 mg/dL Final  . Glucose, Bld 09/18/2013 97  70 - 99 mg/dL Final  . Calcium, Ion 09/18/2013 1.23  1.13 - 1.30  mmol/L Final  . TCO2 09/18/2013 23  0 - 100 mmol/L Final  . Hemoglobin 09/18/2013 12.9* 13.0 - 17.0 g/dL Final  . HCT 09/18/2013 38.0* 39.0 - 52.0 % Final  . Magnesium 09/18/2013 1.9  1.5 - 2.5 mg/dL Final  . Total CK 09/18/2013 1765* 7 - 232 U/L Final     Assessment/Plan  1. Type II or unspecified type diabetes mellitus with peripheral circulatory disorders, uncontrolled(250.72) A1C improving, down to 8.1 from 8.9 Continue diet modifications, counseled to get back into exercising, 30 mins 3-5 x per week encouraged  2. Hypertension Well controlled  3. Restless legs syndrome (RLS) Improved Continue Sinemet CR with Sinemet prn  4. Shortness of breath Resolved  5. Hyperlipidemia Lipitor d/c'd d/t adverse reaction, rhabodomyolysis LDL rising, 100 mg/dL Continue diet modifications - Lipid panel; Future  6. Elevated CPK Myalgias resolved. - CK; Future  7. Iron deficiency anemia Stable - Ferritin; Future

## 2013-12-14 ENCOUNTER — Encounter: Payer: Self-pay | Admitting: Internal Medicine

## 2013-12-30 ENCOUNTER — Ambulatory Visit (INDEPENDENT_AMBULATORY_CARE_PROVIDER_SITE_OTHER): Payer: BC Managed Care – PPO | Admitting: Neurology

## 2013-12-30 ENCOUNTER — Encounter: Payer: Self-pay | Admitting: Neurology

## 2013-12-30 VITALS — BP 118/78 | HR 80 | Ht 68.0 in | Wt 189.0 lb

## 2013-12-30 DIAGNOSIS — G2581 Restless legs syndrome: Secondary | ICD-10-CM

## 2013-12-30 MED ORDER — CLONAZEPAM 0.5 MG PO TABS: ORAL_TABLET | ORAL | Status: DC

## 2013-12-30 NOTE — Progress Notes (Signed)
NEUROLOGY FOLLOW UP OFFICE NOTE  Kenneth SicRalph Hoaglin [id number]  HISTORY OF PRESENT ILLNESS: I had the pleasure of seeing [name] in follow-up in the neurology clinic on 12/30/2013.  The patient was last seen 3 months ago for severe RLS where he had to be admitted for rhabdomyolysis.  He was started on Sinemet, which had helped with his symptoms, however would wear off at night. He was started on controlled-release Sinemet at bedtime, which he reports does help, however he still needs to take an addition dose of immediate-release Sinemet 1-3 times at night. He denies any side effects to the Sinemet. He reports occasional numbness and burning in his right thumb, and over his feet on the left 1st digit and right 5th digit. He tries to keep his glucose levels less than 150. He has occasional headaches over the temples with no associated nausea/vomiting, relieved with Tylenol.  HPI:  This is a very pleasant 63 yo RH man with a history of diabetes, hyperlipidemia, CAD s/p PCI, COPD, and restless leg syndrome, who was admitted [facility] for leg pain due to severe RLS, found to have rhabdomyolysis with CK of 4907. He was admitted for hydration and pain management. He has had RLS for at least 20 years, initially affecting his legs, however since 2004 his arms have been involved as well. He has been taking Mirapex for at least 10 years, and had been takin 0.75mg  dose 1-2 tablets at night. This dose was causing him drowsiness. He saw his PCP on 07/14 due to worsening RLS symptoms. He was tried on Zanaflex and Mirapex was discontinued, which did not help. He restarted Mirapex and had to take additional doses due to constant limb movements and pain, prompting him to go to the ER. He was started on Gabapentin and clonazepam which did not help much, he was also given Dilaudid which quieted him down. He was discharged home then returned to the ER due to persistently restless and painful legs bilaterally.  He was described to be literally thrashing about the bed, able to answer questions but in significant distress. He tells me the symptoms would start in his right leg, leg would flex at the hip, then he stretches and leans back because the leg is jerking so violently for up to 1-1/2 hours. This eases off then starts on his left leg. Walking and moving around helps. Symptoms improved with pain medication. He saw his PCP and reported leg swelling, ?due to gabapentin. He was started on Sinemet 10/100mg  TID, which her reports has helped with the RLS symptoms, however wears off in the middle of the night. He would take 1/2 tab Mirapex when Sinemet wears off. Since starting Sinemet, he reports weird dreams but no other side effects. He had discontinued gabapentin, clonazepam, and Zanaflex.   He has infrequent headaches, dyspnea on exertion (climbing steps), occasional choking. Otherwise he denies any diplopia, dysarthria, focal numbness/tingling, bowel/bladder dysfunction. He has a history of C1 fracture at age 89 or [age]. No known family history of similar RLS because he was adopted.   PAST MEDICAL HISTORY: Past Medical History  Diagnosis Date  . Restless leg   . Cervicalgia   . Loss of weight   . Impotence of organic origin   . Umbilical hernia without mention of obstruction or gangrene   . Barrett's esophagus   . Unspecified gastritis and gastroduodenitis with hemorrhage   . Hyperlipidemia   . Other malaise and fatigue   . Hypoglycemia, unspecified   .  GERD (gastroesophageal reflux disease)   . Complication of anesthesia     " i DO NOT WAKE UP VERY WELL "  . PONV (postoperative nausea and vomiting)   . Hypertension   . Heart murmur     "grew out of it" (12/15/2012)  . Chronic bronchitis     "not bad lately; used to get it 3-4 times/yr" (12/15/2012)  . IDDM (insulin dependent diabetes mellitus)   . H/O hiatal hernia   . Tension headache   . Headache     "weekly" (12/15/2012)  . COPD  (chronic obstructive pulmonary disease)     MEDICATIONS: Current Outpatient Prescriptions on File Prior to Visit  Medication Sig Dispense Refill  . acetaminophen (TYLENOL) 500 MG tablet Take 1,000-1,500 mg by mouth every 6 (six) hours as needed for headache.    Marland Kitchen aspirin 81 MG EC tablet Take 1 tablet (81 mg total) by mouth daily. 60 tablet 0  . carbidopa-levodopa (SINEMET) 10-100 MG per tablet One three times daily to help restless legs 100 tablet 3  . Carbidopa-Levodopa ER (SINEMET CR) 25-100 MG tablet controlled release Take 1 tablet by mouth at bedtime. 30 tablet 6  . carvedilol (COREG) 6.25 MG tablet Take 0.5 tablets (3.125 mg total) by mouth 2 (two) times daily with a meal. 30 tablet 0  . insulin glargine (LANTUS) 100 unit/mL SOPN Inject 20-26 Units into the skin 2 (two) times daily. 20 units in the morning and 26 units in the evening.    . pantoprazole (PROTONIX) 40 MG tablet TAKE 1 TABLET (40 MG TOTAL) BY MOUTH 2 (TWO) TIMES DAILY. 60 tablet 5  . prasugrel (EFFIENT) 10 MG TABS tablet Take 1 tablet (10 mg total) by mouth daily. 30 tablet 0  . nitroGLYCERIN (NITROSTAT) 0.4 MG SL tablet Place 0.4 mg under the tongue every 5 (five) minutes as needed for chest pain.     No current facility-administered medications on file prior to visit.    ALLERGIES: No Known Allergies  FAMILY HISTORY: No family history on file.  SOCIAL HISTORY: History   Social History  . Marital Status: Married    Spouse Name: N/A    Number of Children: N/A  . Years of Education: N/A   Occupational History  . Not on file.   Social History Main Topics  . Smoking status: Never Smoker   . Smokeless tobacco: Never Used  . Alcohol Use: No  . Drug Use: No  . Sexual Activity: Not Currently   Other Topics Concern  . Not on file   Social History Narrative    REVIEW OF SYSTEMS: Constitutional: No fevers, chills, or sweats, no generalized fatigue, change in appetite Eyes: No visual changes, double  vision, eye pain Ear, nose and throat: No hearing loss, ear pain, nasal congestion, sore throat Cardiovascular: No chest pain, palpitations Respiratory:  No shortness of breath at rest or with exertion, wheezes GastrointestinaI: No nausea, vomiting, diarrhea, abdominal pain, fecal incontinence Genitourinary:  No dysuria, urinary retention or frequency Musculoskeletal:  No neck pain, back pain Integumentary: No rash, pruritus, skin lesions Neurological: as above Psychiatric: No depression, insomnia, anxiety Endocrine: No palpitations, fatigue, diaphoresis, mood swings, change in appetite, change in weight, increased thirst Hematologic/Lymphatic:  No anemia, purpura, petechiae. Allergic/Immunologic: no itchy/runny eyes, nasal congestion, recent allergic reactions, rashes  PHYSICAL EXAM: Filed Vitals:   12/30/13 0748  BP: 118/78  Pulse: 80   General:No acute distress. Patient sitting comfortably Head: Normocephalic/atraumatic Eyes: Fundoscopic exam shows bilateral sharp discs, no  vessel changes, exudates, or hemorrhages Neck: supple, no paraspinal tenderness, full range of motion Back: No paraspinal tenderness Heart: regular rate and rhythm Lungs: Clear to auscultation bilaterally. Vascular: No carotid bruits. Skin/Extremities: No rash, no edema Neurological Exam: Mental status: alert and oriented to person, place, and time, no dysarthria or aphasia, Fund of knowledge is appropriate. Recent and remote memory are intact. Attention and concentration are normal. Able to name objects and repeat phrases. Cranial nerves: CN I: not tested CN II: pupils equal, round and reactive to light, visual fields intact, fundi unremarkable. CN III, IV, VI: full range of motion, no nystagmus, no ptosis CN V: facial sensation intact CN VII: upper and lower face symmetric CN VIII: hearing intact to finger rub CN IX, X: gag intact, uvula midline CN XI: sternocleidomastoid and trapezius muscles  intact CN XII: tongue midline Bulk & Tone: normal, no fasciculations. Motor: 5/5 throughout with no pronator drift. Sensation: intact to light touch. No extinction to double simultaneous stimulation. Romberg test negative Deep Tendon Reflexes: +2 throughout, no ankle clonus Plantar responses: downgoing bilaterally Cerebellar: no incoordination on finger to nose, heel to shin. No dysdiadochokinesia Gait: narrow-based and steady, able to tandem walk adequately. Tremor: none  IMPRESSION: This is a pleasant 63 yo RH man with a history of diabetes, hyperlipidemia, CAD s/p PCI, COPD, and restless leg syndrome, who was [facility] for leg pain due to severe RLS, found to have rhabdomyolysis with CK of 4907. He is now on Sinemet which does help with his symptoms, however he would wake up still in the middle of the night to take an additional immediate-release dose. We discussed options, I would recommend add-on of low dose clonazepam at bedtime for RLS to hopefully help him sleep through the night. He is understandably concerned about daytime drowsiness and will start on 1/2 tablet of 0.5mg , and uptitrate as tolerated. If ineffective, we will consider increasing evening dose of Sinemet CR.  He will continue low dose TID dosing during the daytime.  He will follow-up in 2 months and knows to call our office for any problems in the interim.  Thank you for allowing me to participate in his care.  Please do not hesitate to call for any questions or concerns.  The duration of this appointment visit was 15 minutes of face-to-face time with the patient.  Greater than 50% of this time was spent in counseling, explanation of diagnosis, planning of further management, and coordination of care.   Patrcia DollyKaren Hollan Philipp, M.D.   CC: Dr. Chilton SiGreen

## 2013-12-30 NOTE — Patient Instructions (Signed)
1. Continue Sinemet as previously prescribed 2. Start low dose clonazepam 0.5mg : Take 1/2 tablet at night 3. Follow-up in 2 months, call our office for any problems

## 2014-01-04 ENCOUNTER — Encounter: Payer: Self-pay | Admitting: Neurology

## 2014-02-10 ENCOUNTER — Encounter (HOSPITAL_COMMUNITY): Payer: Self-pay | Admitting: Cardiology

## 2014-02-17 ENCOUNTER — Other Ambulatory Visit: Payer: Self-pay | Admitting: Internal Medicine

## 2014-03-01 ENCOUNTER — Ambulatory Visit (INDEPENDENT_AMBULATORY_CARE_PROVIDER_SITE_OTHER): Payer: BC Managed Care – PPO | Admitting: Neurology

## 2014-03-01 ENCOUNTER — Encounter: Payer: Self-pay | Admitting: Neurology

## 2014-03-01 VITALS — BP 124/84 | HR 66 | Resp 16 | Ht 68.0 in | Wt 190.0 lb

## 2014-03-01 DIAGNOSIS — R748 Abnormal levels of other serum enzymes: Secondary | ICD-10-CM

## 2014-03-01 DIAGNOSIS — G2581 Restless legs syndrome: Secondary | ICD-10-CM

## 2014-03-01 MED ORDER — CARBIDOPA-LEVODOPA 25-100 MG PO TABS: 1.0000 | ORAL_TABLET | Freq: Three times a day (TID) | ORAL | Status: DC

## 2014-03-01 MED ORDER — TRAMADOL HCL 50 MG PO TABS: ORAL_TABLET | ORAL | Status: DC

## 2014-03-01 NOTE — Patient Instructions (Addendum)
1. Increase Sinemet to 25/100mg  tablets three times a day with meals 2. Start Tramadol 50mg : Take 1/2 tablet at bedtime for 3 days, then increase to 1 tablet at bedtime 3. Continue controlled-release Sinemet at bedtime 4. Schedule EMG/NCV of right UE and LE with Dr. Allena KatzPatel 5. Follow-up after EMG

## 2014-03-01 NOTE — Progress Notes (Signed)
NEUROLOGY FOLLOW UP OFFICE NOTE  [name] [id number]  HISTORY OF PRESENT ILLNESS: I had the pleasure of seeing [name] in follow-up in the neurology clinic on 03/01/2014.  The patient was last seen 2 months ago for restless leg syndrome. He had improvement in symptoms with Sinemet but continued to wake up several times at night. He did not notice much change with clonazepam and discontinued this. He [place] that [place] and RLS did not bother his at all, however 2 days ago he was having bothersome symptoms during the day and had a bad night until it eased off at 2am. Yesterday symptoms started at 2pm, he took 3 Sinemet 10/100mg  tablets between then and 9pm, then took the time-release Sinemet.   He has noticed more headaches over the past month, takes Tylenol twice a day. He had significantly elevated CK levels when RLS symptoms were most severe. He reports that statins had caused muscle soreness and increase in RLS symptoms. He stopped Crestor 4 weeks ago and has noticed improvement. He tells me that his CK levels are being monitored, per patient last CK was elevated, records requested for review. He has noticed mild proximal weakness when working and picking things up. He denies any diplopia, dysarthria, dysphagia, focal numbness/ tingling.   HPI: This is a very pleasant 63 yo RH man with a history of diabetes, hyperlipidemia, CAD s/p PCI, COPD, and restless leg syndrome, who was [facility] for leg pain due to severe RLS, found to have rhabdomyolysis with CK of 4907. He was admitted for hydration and pain management. He has had RLS for at least 20 years, initially affecting his legs, however since 2004 his arms have been involved as well. He has been taking Mirapex for at least 10 years, and had been taking 0.75mg  dose 1-2 tablets at night. This dose was causing him drowsiness. He saw his PCP on 07/14 due to worsening RLS symptoms. He was tried on  Zanaflex and Mirapex was discontinued, which did not help. He restarted Mirapex and had to take additional doses due to constant limb movements and pain, prompting him to go to the ER. He was started on Gabapentin and clonazepam which did not help much, he was also given Dilaudid which quieted him down. He was discharged home then returned to the ER due to persistently restless and painful legs bilaterally. He was described to be literally thrashing about the bed, able to answer questions but in significant distress. He tells me the symptoms would start in his right leg, leg would flex at the hip, then he stretches and leans back because the leg is jerking so violently for up to 1-1/2 hours. This eases off then starts on his left leg. Walking and moving around helps. Symptoms improved with pain medication. He saw his PCP and reported leg swelling, ?due to gabapentin. He was started on Sinemet 10/100mg  TID, which her reports has helped with the RLS symptoms, however wears off in the middle of the night. He would take 1/2 tab Mirapex when Sinemet wears off. Since starting Sinemet, he reports weird dreams but no other side effects. He had discontinued gabapentin, clonazepam, and Zanaflex.   PAST MEDICAL HISTORY: Past Medical History  Diagnosis Date  . Restless leg   . Cervicalgia   . Loss of weight   . Impotence of organic origin   . Umbilical hernia without mention of obstruction or gangrene   . Barrett's esophagus   .  Unspecified gastritis and gastroduodenitis with hemorrhage   . Hyperlipidemia   . Other malaise and fatigue   . Hypoglycemia, unspecified   . GERD (gastroesophageal reflux disease)   . Complication of anesthesia     " i DO NOT WAKE UP VERY WELL "  . PONV (postoperative nausea and vomiting)   . Hypertension   . Heart murmur     "grew out of it" (12/15/2012)  . Chronic bronchitis     "not bad lately; used to get it 3-4 times/yr" (12/15/2012)  . IDDM (insulin dependent  diabetes mellitus)   . H/O hiatal hernia   . Tension headache   . Headache     "weekly" (12/15/2012)  . COPD (chronic obstructive pulmonary disease)     MEDICATIONS: Current Outpatient Prescriptions on File Prior to Visit  Medication Sig Dispense Refill  . acetaminophen (TYLENOL) 500 MG tablet Take 1,000-1,500 mg by mouth every 6 (six) hours as needed for headache.    Marland Kitchen aspirin 81 MG EC tablet Take 1 tablet (81 mg total) by mouth daily. 60 tablet 0  . carbidopa-levodopa (SINEMET) 10-100 MG per tablet One three times daily to help restless legs 100 tablet 3  . Carbidopa-Levodopa ER (SINEMET CR) 25-100 MG tablet controlled release Take 1 tablet by mouth at bedtime. 30 tablet 6  . carvedilol (COREG) 6.25 MG tablet Take 0.5 tablets (3.125 mg total) by mouth 2 (two) times daily with a meal. 30 tablet 0  . clonazePAM (KLONOPIN) 0.5 MG tablet Take 1 tablet at night 30 tablet 3  . glyBURIDE (DIABETA) 2.5 MG tablet TAKE 1 TABLET (2.5 MG TOTAL) BY MOUTH DAILY WITH BREAKFAST. 30 tablet 3  . insulin glargine (LANTUS) 100 unit/mL SOPN Inject 20-26 Units into the skin 2 (two) times daily. 20 units in the morning and 26 units in the evening.    . nitroGLYCERIN (NITROSTAT) 0.4 MG SL tablet Place 0.4 mg under the tongue every 5 (five) minutes as needed for chest pain.    . pantoprazole (PROTONIX) 40 MG tablet TAKE 1 TABLET (40 MG TOTAL) BY MOUTH 2 (TWO) TIMES DAILY. 60 tablet 5  . prasugrel (EFFIENT) 10 MG TABS tablet Take 1 tablet (10 mg total) by mouth daily. 30 tablet 0   No current facility-administered medications on file prior to visit.    ALLERGIES: No Known Allergies  FAMILY HISTORY: No family history on file.  SOCIAL HISTORY: History   Social History  . Marital Status: Married    Spouse Name: N/A    Number of Children: N/A  . Years of Education: N/A   Occupational History  . Not on file.   Social History Main Topics  . Smoking status: Never Smoker   . Smokeless tobacco: Never  Used  . Alcohol Use: No  . Drug Use: No  . Sexual Activity: Not Currently   Other Topics Concern  . Not on file   Social History Narrative    REVIEW OF SYSTEMS: Constitutional: No fevers, chills, or sweats, no generalized fatigue, change in appetite Eyes: No visual changes, double vision, eye pain Ear, nose and throat: No hearing loss, ear pain, nasal congestion, sore throat Cardiovascular: No chest pain, palpitations Respiratory:  No shortness of breath at rest or with exertion, wheezes GastrointestinaI: No nausea, vomiting, diarrhea, abdominal pain, fecal incontinence Genitourinary:  No dysuria, urinary retention or frequency Musculoskeletal:  No neck pain, back pain Integumentary: No rash, pruritus, skin lesions Neurological: as above Psychiatric: No depression, insomnia, anxiety Endocrine: No palpitations,  fatigue, diaphoresis, mood swings, change in appetite, change in weight, increased thirst Hematologic/Lymphatic:  No anemia, purpura, petechiae. Allergic/Immunologic: no itchy/runny eyes, nasal congestion, recent allergic reactions, rashes  PHYSICAL EXAM: Filed Vitals:   03/01/14 0811  BP: 124/84  Pulse: 66  Resp: 16   General: No acute distress Head:  Normocephalic/atraumatic Neck: supple, no paraspinal tenderness, full range of motion Heart:  Regular rate and rhythm Lungs:  Clear to auscultation bilaterally Back: No paraspinal tenderness Skin/Extremities: No rash, no edema Neurological Exam: alert and oriented to person, place, and time. No aphasia or dysarthria. Fund of knowledge is appropriate.  Recent and remote memory are intact.  Attention and concentration are normal.    Able to name objects and repeat phrases. Cranial nerves: Pupils equal, round, reactive to light.  Fundoscopic exam unremarkable, no papilledema. Extraocular movements intact with no nystagmus. Visual fields full. Facial sensation intact. No facial asymmetry. Tongue, uvula, palate midline.   Motor: Bulk and tone normal, no fasciculations. Muscle strength 5/5 throughout including neck flexors and extensors, no pronator drift.  Sensation to light touch, temperature and vibration intact.  No extinction to double simultaneous stimulation.  Deep tendon reflexes 1+ throughout, toes downgoing.  Finger to nose testing intact.  Gait narrow-based and steady, able to tandem walk adequately.  Romberg negative.  IMPRESSION: This is a pleasant 63 yo RH man with a history of diabetes, hyperlipidemia, CAD s/p PCI, COPD, and restless leg syndrome, who [facility] for leg pain due to severe RLS, found to have rhabdomyolysis with CK of 4907. Sinemet helps but recently RLS has been more bothersome even during the daytime. He will increase Sinemet to 25/100mg  TID before meals. Continue current dose of Sinemet CR at bedtime. He prefers to stop the clonazepam due to drowsiness concern on higher doses. He will start Tramadol 50mg  qhs for the RLS. He reports that CK levels continue to be elevated and are being monitored by his Cardiologist. Records requested for review. He also reports mild proximal weakness. EMG/NCV of the right UE and LE will be ordered to assess for myopathy. He will follow-up after the EMG and knows to call our office for any problems.  Thank you for allowing me to participate in his care.  Please do not hesitate to call for any questions or concerns.  The duration of this appointment visit was 25 minutes of face-to-face time with the patient.  Greater than 50% of this time was spent in counseling, explanation of diagnosis, planning of further management, and coordination of care.   Patrcia DollyKaren Aquino, M.D.   CC: Dr. Chilton SiGreen

## 2014-03-03 ENCOUNTER — Encounter: Payer: Self-pay | Admitting: Neurology

## 2014-03-19 ENCOUNTER — Other Ambulatory Visit: Payer: Self-pay | Admitting: Internal Medicine

## 2014-03-23 ENCOUNTER — Ambulatory Visit (INDEPENDENT_AMBULATORY_CARE_PROVIDER_SITE_OTHER): Payer: 59 | Admitting: Neurology

## 2014-03-23 DIAGNOSIS — R531 Weakness: Secondary | ICD-10-CM

## 2014-03-23 DIAGNOSIS — R748 Abnormal levels of other serum enzymes: Secondary | ICD-10-CM

## 2014-03-23 DIAGNOSIS — R202 Paresthesia of skin: Secondary | ICD-10-CM

## 2014-03-23 NOTE — Procedures (Signed)
[facility]  [address], [address]  [place], [place] [address] Tel: [phone] Fax:  [phone] Test Date:  03/23/2014  Patient: [name] DOB: [birth year] Physician: Nita Sickle, DO  Sex: Male Height:  Ref Phys: Patrcia Dolly  ID#: [id number] Temp: 33.4C Technician:    Patient Complaints: Patient is a 64 year old male here for evaluation of generalized weakness in the setting of hyperCkemia.  NCV & EMG Findings: Extensive electrodiagnostic testing of the right upper extremity, right lower extremity, and additional studies of the left lower extremity shows:  1. Right median, ulnar, radial, and palmar studies are within normal limits. 2. Median and ulnar motor responses are within normal limits  3. Bilateral sural and superficial peroneal sensory responses are low-normal. 4. Right peroneal and tibial motor responses are within normal limits. 5. There is no evidence of active or chronic motor axon loss changes affecting any of the tested muscles. In particular, motor unit configuration and recruitment pattern is within normal limits.  Impression: This is a normal study of the right upper and lower extremity.  In particular, there is no evidence of a generalized myopathy, cervical/lumbosacral radiculopathy or carpal tunnel syndrome or a sensorimotor polyneuropathy.   ___________________________ Nita Sickle, DO    Nerve Conduction Studies Anti Sensory Summary Table   Site NR Peak (ms) Norm Peak (ms) P-T Amp (V) Norm P-T Amp  Right Median Anti Sensory (2nd Digit)  Wrist    3.0 <3.8 17.5 >10  Right Radial Anti Sensory (Base 1st Digit)  Wrist    2.3 <2.8 20.6 >10  Left Sup Peroneal Anti Sensory (Ant Lat Mall)  32C  12 cm    4.0 <4.6 3.5 >3  Right Sup Peroneal Anti Sensory (Ant Lat Mall)  32C  12 cm    3.6 <4.6 4.6 >3  Left Sural Anti Sensory (Lat Mall)  32C  Calf    4.4 <4.6 3.7 >3  Right Sural Anti Sensory (Lat Mall)  Calf    3.8 <4.6 3.2 >3    Right Ulnar Anti Sensory (5th Digit)  Wrist    2.9 <3.2 15.0 >5   Motor Summary Table   Site NR Onset (ms) Norm Onset (ms) O-P Amp (mV) Norm O-P Amp Site1 Site2 Delta-0 (ms) Dist (cm) Vel (m/s) Norm Vel (m/s)  Right Median Motor (Abd Poll Brev)  Wrist    3.0 <4.0 11.0 >5 Elbow Wrist 4.8 26.0 54 >50  Elbow    7.8  10.5         Right Peroneal Motor (Ext Dig Brev)  Ankle    3.7 <6.0 6.1 >2.5 B Fib Ankle 7.9 33.0 42 >40  B Fib    11.6  5.4  Poplt B Fib 2.4 10.0 42 >40  Poplt    14.0  5.5         Right Peroneal TA Motor (Tib Ant)  Fib Head    2.5 <4.5 7.4 >3 Poplit Fib Head 2.4 10.0 42 >40  Poplit    4.9  6.4         Right Tibial Motor (Abd Hall Brev)  Ankle    3.9 <6.0 12.6 >4 Knee Ankle 8.6 41.5 48 >40  Knee    12.5  9.0         Right Ulnar Motor (Abd Dig Minimi)  Wrist    2.3 <3.1 12.3 >7 B Elbow Wrist 3.8 22.0 58 >50  B Elbow    6.1  10.6  A  Elbow B Elbow 1.9 10.0 53 >50  A Elbow    8.0  10.1          Comparison Summary Table   Site NR Peak (ms) Norm Peak (ms) P-T Amp (V) Site1 Site2 Delta-P (ms) Norm Delta (ms)  Right Median/Ulnar Palm Comparison (Wrist - 8cm)  32C  Median Palm    1.9 <2.2 41.2 Median Palm Ulnar Palm 0.3   Ulnar Palm    1.6 <2.2 15.4       H Reflex Studies   NR H-Lat (ms) Lat Norm (ms) L-R H-Lat (ms)  Left Tibial (Gastroc)  32C     35.24 <35 0.00  Right Tibial (Gastroc)  32C     35.24 <35 0.00   EMG   Side Muscle Ins Act Fibs Psw Fasc Number Recrt Dur Dur. Amp Amp. Poly Poly. Comment  Right 1stDorInt Nml Nml Nml Nml Nml Nml Nml Nml Nml Nml Nml Nml N/A  Right FlexPolLong Nml Nml Nml Nml Nml Nml Nml Nml Nml Nml Nml Nml N/A  Right PronatorTeres Nml Nml Nml Nml Nml Nml Nml Nml Nml Nml Nml Nml N/A  Right Biceps Nml Nml Nml Nml Nml Nml Nml Nml Nml Nml Nml Nml N/A  Right Triceps Nml Nml Nml Nml Nml Nml Nml Nml Nml Nml Nml Nml N/A  Right Deltoid Nml Nml Nml Nml Nml Nml Nml Nml Nml Nml Nml Nml N/A  Right AntTibialis Nml Nml Nml Nml Nml Nml Nml Nml Nml Nml  Nml Nml N/A  Right Gastroc Nml Nml Nml Nml Nml Nml Nml Nml Nml Nml Nml Nml N/A  Right Flex Dig Long Nml Nml Nml Nml Nml Nml Nml Nml Nml Nml Nml Nml N/A  Right VastusLat Nml Nml Nml Nml Nml Nml Nml Nml Nml Nml Nml Nml N/A  Right BicepsFemS Nml Nml Nml Nml Nml Nml Nml Nml Nml Nml Nml Nml N/A      Waveforms:

## 2014-03-25 ENCOUNTER — Other Ambulatory Visit: Payer: Self-pay | Admitting: Internal Medicine

## 2014-03-28 NOTE — Telephone Encounter (Signed)
Tiff, can you pls follow-up, I increased Sinemet to 25/100mg  TID on his last visit. It appears pharmacy is asking for Rx for 10/100mg  tabs? Thanks

## 2014-03-29 ENCOUNTER — Telehealth: Payer: Self-pay | Admitting: Family Medicine

## 2014-03-29 NOTE — Telephone Encounter (Signed)
Dr. Karel JarvisAquino I spoke with him. The old Rx was on automatic refill. He is taking the new dose of Sinemet but he states he is only taking 1 in the am & not taking the other 2 doses as prescribed. He states he has started also taking 1/2 of a 75 mg Mirapex around 2 pm daily & takes the other half in the evening. He states this seems to be working for him, he states he did this because he felt he was taking too much medication.

## 2014-03-29 NOTE — Telephone Encounter (Signed)
That is fine, continue on what he is taking for now. Thanks

## 2014-04-04 ENCOUNTER — Other Ambulatory Visit: Payer: 59

## 2014-04-04 DIAGNOSIS — IMO0002 Reserved for concepts with insufficient information to code with codable children: Secondary | ICD-10-CM

## 2014-04-04 DIAGNOSIS — R748 Abnormal levels of other serum enzymes: Secondary | ICD-10-CM

## 2014-04-04 DIAGNOSIS — D509 Iron deficiency anemia, unspecified: Secondary | ICD-10-CM

## 2014-04-04 DIAGNOSIS — E1165 Type 2 diabetes mellitus with hyperglycemia: Secondary | ICD-10-CM

## 2014-04-04 DIAGNOSIS — E785 Hyperlipidemia, unspecified: Secondary | ICD-10-CM

## 2014-04-05 LAB — CK: Total CK: 78 U/L (ref 24–204)

## 2014-04-05 LAB — COMPREHENSIVE METABOLIC PANEL
ALK PHOS: 58 IU/L (ref 39–117)
ALT: 19 IU/L (ref 0–44)
AST: 22 IU/L (ref 0–40)
Albumin/Globulin Ratio: 1.6 (ref 1.1–2.5)
Albumin: 4.1 g/dL (ref 3.6–4.8)
BILIRUBIN TOTAL: 0.4 mg/dL (ref 0.0–1.2)
BUN / CREAT RATIO: 16 (ref 10–22)
BUN: 17 mg/dL (ref 8–27)
CALCIUM: 9 mg/dL (ref 8.6–10.2)
CO2: 22 mmol/L (ref 18–29)
Chloride: 101 mmol/L (ref 97–108)
Creatinine, Ser: 1.08 mg/dL (ref 0.76–1.27)
GFR calc non Af Amer: 72 mL/min/{1.73_m2} (ref >59)
GFR, EST AFRICAN AMERICAN: 83 mL/min/{1.73_m2} (ref >59)
GLUCOSE: 141 mg/dL — AB (ref 65–99)
Globulin, Total: 2.6 g/dL (ref 1.5–4.5)
Potassium: 4.5 mmol/L (ref 3.5–5.2)
Sodium: 138 mmol/L (ref 134–144)
TOTAL PROTEIN: 6.7 g/dL (ref 6.0–8.5)

## 2014-04-05 LAB — CBC WITH DIFFERENTIAL
Basophils Absolute: 0 10*3/uL (ref 0.0–0.2)
Basos: 1 %
EOS: 2 %
Eosinophils Absolute: 0.1 10*3/uL (ref 0.0–0.4)
HEMATOCRIT: 37.8 % (ref 37.5–51.0)
HEMOGLOBIN: 12.6 g/dL (ref 12.6–17.7)
IMMATURE GRANS (ABS): 0 10*3/uL (ref 0.0–0.1)
IMMATURE GRANULOCYTES: 0 %
Lymphocytes Absolute: 1.5 10*3/uL (ref 0.7–3.1)
Lymphs: 34 %
MCH: 27.3 pg (ref 26.6–33.0)
MCHC: 33.3 g/dL (ref 31.5–35.7)
MCV: 82 fL (ref 79–97)
Monocytes Absolute: 0.4 10*3/uL (ref 0.1–0.9)
Monocytes: 9 %
Neutrophils Absolute: 2.4 10*3/uL (ref 1.4–7.0)
Neutrophils Relative %: 54 %
RBC: 4.61 x10E6/uL (ref 4.14–5.80)
RDW: 13.6 % (ref 12.3–15.4)
WBC: 4.4 10*3/uL (ref 3.4–10.8)

## 2014-04-05 LAB — LIPID PANEL
CHOLESTEROL TOTAL: 172 mg/dL (ref 100–199)
Chol/HDL Ratio: 5.4 ratio units — ABNORMAL HIGH (ref 0.0–5.0)
HDL: 32 mg/dL — AB (ref >39)
LDL CALC: 108 mg/dL — AB (ref 0–99)
TRIGLYCERIDES: 161 mg/dL — AB (ref 0–149)
VLDL CHOLESTEROL CAL: 32 mg/dL (ref 5–40)

## 2014-04-05 LAB — FERRITIN: Ferritin: 19 ng/mL — ABNORMAL LOW (ref 30–400)

## 2014-04-06 ENCOUNTER — Ambulatory Visit (INDEPENDENT_AMBULATORY_CARE_PROVIDER_SITE_OTHER): Payer: 59 | Admitting: Internal Medicine

## 2014-04-06 ENCOUNTER — Encounter: Payer: Self-pay | Admitting: Internal Medicine

## 2014-04-06 VITALS — BP 138/80 | HR 60 | Temp 97.8°F | Resp 20 | Ht 68.0 in | Wt 190.0 lb

## 2014-04-06 DIAGNOSIS — E785 Hyperlipidemia, unspecified: Secondary | ICD-10-CM

## 2014-04-06 DIAGNOSIS — I798 Other disorders of arteries, arterioles and capillaries in diseases classified elsewhere: Secondary | ICD-10-CM

## 2014-04-06 DIAGNOSIS — M6282 Rhabdomyolysis: Secondary | ICD-10-CM

## 2014-04-06 DIAGNOSIS — R0602 Shortness of breath: Secondary | ICD-10-CM

## 2014-04-06 DIAGNOSIS — M25519 Pain in unspecified shoulder: Secondary | ICD-10-CM

## 2014-04-06 DIAGNOSIS — I10 Essential (primary) hypertension: Secondary | ICD-10-CM

## 2014-04-06 DIAGNOSIS — G2581 Restless legs syndrome: Secondary | ICD-10-CM

## 2014-04-06 DIAGNOSIS — M542 Cervicalgia: Secondary | ICD-10-CM

## 2014-04-06 DIAGNOSIS — R748 Abnormal levels of other serum enzymes: Secondary | ICD-10-CM

## 2014-04-06 DIAGNOSIS — E1151 Type 2 diabetes mellitus with diabetic peripheral angiopathy without gangrene: Secondary | ICD-10-CM

## 2014-04-06 DIAGNOSIS — G44209 Tension-type headache, unspecified, not intractable: Secondary | ICD-10-CM

## 2014-04-06 DIAGNOSIS — R079 Chest pain, unspecified: Secondary | ICD-10-CM

## 2014-04-06 DIAGNOSIS — D509 Iron deficiency anemia, unspecified: Secondary | ICD-10-CM

## 2014-04-06 MED ORDER — CARBIDOPA-LEVODOPA 25-100 MG PO TABS: ORAL_TABLET | ORAL | Status: DC

## 2014-04-06 NOTE — Progress Notes (Signed)
Patient ID: [name], male   DOB: 02-Jun-1950, 64 y.o.   MRN: [id number]    Facility  [facility]    Place of Service:   OFFICE   Allergies  Allergen Reactions  . Statins Other (See Comments)    Causes Restless Legs    Chief Complaint  Patient presents with  . 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 He has increase morning insulin to 24 units and continues 26 units in the evening. He adds glyburide 2.5 mg in the morning if the morning sugars are running high.  Hyperlipidemia -mild elevation in LDL. Low HDL. Ideally, we would like the LDL lower, but he has had reactions to HMG co-A inhibitors and I do not think he should be put back on these medications.  Essential hypertension - controlled  Restless legs syndrome (RLS)-doing much better  Elevated CPK - back to normal  Iron deficiency anemia - hemoglobin remains in the normal range at 12.6, however, the ferritin level is low.   Restless leg syndrome, uncontrolled - doing much better on less medication. Still using pramipexole (MIRAPEX) 0.75 MG tablet, carbidopa-levodopa (SINEMET IR) 25-100 MG per tablet  Shortness of breath: Continues to be short of breath with exertion. He is going to discuss this with his cardiologist soon.  Chest pain, unspecified chest pain type - continues with central chest discomfort. Exertion seems to bring this on and make it worse. He will be discussing this with his cardiologist.  Non-traumatic rhabdomyolysis - resolved  Tension headache - continues to be an issue from time to time. Patient had left temporal headache yesterday. Using Tylenol for relief.  Cervicalgia - chronic issue which is about the same.  Pain in joint, shoulder region, unspecified laterality - chronic issue which is unchanged.    Medications: Patient's Medications  New  Prescriptions   No medications on file  Previous Medications   ACETAMINOPHEN (TYLENOL) 500 MG TABLET    Take 1,000-1,500 mg by mouth every 6 (six) hours as needed for headache.   ASPIRIN 81 MG EC TABLET    Take 1 tablet (81 mg total) by mouth daily.   CARBIDOPA-LEVODOPA (SINEMET IR) 25-100 MG PER TABLET    Take 1 tablet by mouth 3 (three) times daily. Before meals   CARVEDILOL (COREG) 6.25 MG TABLET    Take 0.5 tablets (3.125 mg total) by mouth 2 (two) times daily with a meal.   GLYBURIDE (DIABETA) 2.5 MG TABLET    TAKE 1 TABLET (2.5 MG TOTAL) BY MOUTH DAILY WITH BREAKFAST.   INSULIN GLARGINE (LANTUS) 100 UNIT/ML SOPN    Inject 20-26 Units into the skin 2 (two) times daily. 20 units in the morning and 26 units in the evening.   NITROGLYCERIN (NITROSTAT) 0.4 MG SL TABLET    Place 0.4 mg under the tongue every 5 (five) minutes as needed for chest pain.   ONE TOUCH ULTRA TEST TEST STRIP       PANTOPRAZOLE (PROTONIX) 40 MG TABLET    TAKE 1 TABLET (40 MG TOTAL) BY MOUTH 2 (TWO) TIMES DAILY.   PRAMIPEXOLE (MIRAPEX) 0.75 MG TABLET    Take 75 mg by mouth 2 (two) times daily.   PRASUGREL (EFFIENT) 10 MG TABS TABLET    Take 1 tablet (10 mg total) by mouth daily.   TRAMADOL (ULTRAM) 50 MG TABLET  1 tablet at bedtime  Modified Medications   No medications on file  Discontinued Medications   CARBIDOPA-LEVODOPA ER (SINEMET CR) 25-100 MG TABLET CONTROLLED RELEASE    Take 1 tablet by mouth at bedtime.     Review of Systems  Constitutional: Negative for fever and fatigue.  HENT: Positive for hearing loss. Negative for congestion and ear pain.   Eyes: Negative.        Wears prescription lenses.  Respiratory: Negative for cough, choking, chest tightness, shortness of breath (with exertion) and wheezing.   Cardiovascular: Negative for chest pain, palpitations and leg swelling.  Gastrointestinal: Negative for nausea, vomiting, abdominal pain, diarrhea, constipation and abdominal distention.        Barrett's esophagus and reflux history  Endocrine: Negative for cold intolerance, heat intolerance, polydipsia, polyphagia and polyuria.       Diabetes under poor control.  Genitourinary: Negative.   Musculoskeletal: Positive for back pain, joint swelling, arthralgias and gait problem. Negative for myalgias, neck pain and neck stiffness.  Skin: Negative.   Allergic/Immunologic: Negative.   Neurological: Positive for weakness, numbness (to bilateral feet, with  restless legs) and headaches. Negative for dizziness, tremors, seizures, syncope, facial asymmetry, speech difficulty and light-headedness.       Severe restless legs  Hematological: Bruises/bleeds easily.       Iron deficiency anemia without obvious blood loss.  Psychiatric/Behavioral: The patient is nervous/anxious.     Filed Vitals:   04/06/14 1131  BP: 138/80  Pulse: 60  Temp: 97.8 F (36.6 C)  TempSrc: Oral  Resp: 20  Height:  (1.727 m)  Weight: 190 lb (86.183 kg)  SpO2: 99%   Body mass index is 28.9 kg/(m^2).  Physical Exam  Constitutional: He is oriented to person, place, and time. He appears well-developed and well-nourished. No distress.  overweight  HENT:  Head: Normocephalic and atraumatic.  Right Ear: External ear normal.  Left Ear: External ear normal.  Nose: Nose normal.  Mouth/Throat: Oropharynx is clear and moist.  Eyes: Conjunctivae and EOM are normal. Pupils are equal, round, and reactive to light.  Neck: Normal range of motion. Neck supple. No JVD present. No tracheal deviation present. No thyromegaly present.  Cardiovascular: Normal rate, regular rhythm, normal heart sounds and intact distal pulses.  Exam reveals no gallop and no friction rub.   No murmur heard. Pulmonary/Chest: Effort normal. No respiratory distress. He has no wheezes. He has no rales. He exhibits no tenderness.  Abdominal: Soft. Bowel sounds are normal. He exhibits no distension and no mass.  Musculoskeletal: He exhibits no  edema or tenderness.  Bilateral shoulder discomfort with some restriction of movement especially at the left shoulder. Painful to raise left arm. Painful to lift items and to rotate at left shoulder. Has been seen by Dr. Thurston Hole. Chronic neck pains. Tender at insertion of the left Achilles tendon.  Lymphadenopathy:    He has no cervical adenopathy.  Neurological: He is alert and oriented to person, place, and time. He has normal reflexes. No cranial nerve deficit. Coordination normal.  Bilateral restless legs.  Skin: Skin is warm and dry. No rash noted. No erythema. No pallor.  Psychiatric: He has a normal mood and affect. His behavior is normal. Judgment and thought content normal.     Labs reviewed: Appointment on 04/04/2014  Component Date Value Ref Range Status  . WBC 04/04/2014 4.4  3.4 - 10.8 x10E3/uL Final  . RBC 04/04/2014 4.61  4.14 - 5.80 x10E6/uL Final  . Hemoglobin  04/04/2014 12.6  12.6 - 17.7 g/dL Final  . HCT 16/12/9602 37.8  37.5 - 51.0 % Final  . MCV 04/04/2014 82  79 - 97 fL Final  . MCH 04/04/2014 27.3  26.6 - 33.0 pg Final  . MCHC 04/04/2014 33.3  31.5 - 35.7 g/dL Final  . RDW 54/11/8117 13.6  12.3 - 15.4 % Final  . Neutrophils Relative % 04/04/2014 54   Final  . Lymphs 04/04/2014 34   Final  . Monocytes 04/04/2014 9   Final  . Eos 04/04/2014 2   Final  . Basos 04/04/2014 1   Final  . Neutrophils Absolute 04/04/2014 2.4  1.4 - 7.0 x10E3/uL Final  . Lymphocytes Absolute 04/04/2014 1.5  0.7 - 3.1 x10E3/uL Final  . Monocytes Absolute 04/04/2014 0.4  0.1 - 0.9 x10E3/uL Final  . Eosinophils Absolute 04/04/2014 0.1  0.0 - 0.4 x10E3/uL Final  . Basophils Absolute 04/04/2014 0.0  0.0 - 0.2 x10E3/uL Final  . Immature Granulocytes 04/04/2014 0   Final  . Immature Grans (Abs) 04/04/2014 0.0  0.0 - 0.1 x10E3/uL Final  . Ferritin 04/04/2014 19* 30 - 400 ng/mL Final  . Glucose 04/04/2014 141* 65 - 99 mg/dL Final  . BUN 14/78/2956 17  8 - 27 mg/dL Final  . Creatinine, Ser  04/04/2014 1.08  0.76 - 1.27 mg/dL Final  . GFR calc non Af Amer 04/04/2014 72  >59 mL/min/1.73 Final  . GFR calc Af Amer 04/04/2014 83  >59 mL/min/1.73 Final  . BUN/Creatinine Ratio 04/04/2014 16  10 - 22 Final  . Sodium 04/04/2014 138  134 - 144 mmol/L Final  . Potassium 04/04/2014 4.5  3.5 - 5.2 mmol/L Final  . Chloride 04/04/2014 101  97 - 108 mmol/L Final  . CO2 04/04/2014 22  18 - 29 mmol/L Final  . Calcium 04/04/2014 9.0  8.6 - 10.2 mg/dL Final  . Total Protein 04/04/2014 6.7  6.0 - 8.5 g/dL Final  . Albumin 21/30/8657 4.1  3.6 - 4.8 g/dL Final  . Globulin, Total 04/04/2014 2.6  1.5 - 4.5 g/dL Final  . Albumin/Globulin Ratio 04/04/2014 1.6  1.1 - 2.5 Final  . Total Bilirubin 04/04/2014 0.4  0.0 - 1.2 mg/dL Final  . Alkaline Phosphatase 04/04/2014 58  39 - 117 IU/L Final  . AST 04/04/2014 22  0 - 40 IU/L Final  . ALT 04/04/2014 19  0 - 44 IU/L Final  . Cholesterol, Total 04/04/2014 172  100 - 199 mg/dL Final  . Triglycerides 04/04/2014 161* 0 - 149 mg/dL Final  . HDL 84/69/6295 32* >39 mg/dL Final   Comment: According to ATP-III Guidelines, HDL-C >59 mg/dL is considered a negative risk factor for CHD.   Marland Kitchen VLDL Cholesterol Cal 04/04/2014 32  5 - 40 mg/dL Final  . LDL Calculated 04/04/2014 284* 0 - 99 mg/dL Final  . Chol/HDL Ratio 04/04/2014 5.4* 0.0 - 5.0 ratio units Final   Comment:                                   T. Chol/HDL Ratio                                             Men  Women  1/2 Avg.Risk  3.4    3.3                                   Avg.Risk  5.0    4.4                                2X Avg.Risk  9.6    7.1                                3X Avg.Risk 23.4   11.0   . Total CK 04/04/2014 78  24 - 204 U/L Final     Assessment/Plan 1. 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 Hyperlipidemia Continue to observe. No new medications. - Lipid panel; Future  3. Essential hypertension Rolled - Basic metabolic panel; Future  4. Restless legs syndrome (RLS) Improved  5. Elevated CPK Resolved  6. Iron deficiency anemia Continue to monitor - CBC With Differential; Future - IBC panel; Future - Iron; Future  7. Restless leg syndrome, uncontrolled Improved - pramipexole (MIRAPEX) 0.75 MG tablet; Take 0.75 mg by mouth 2 (two) times daily.; Refill: 4 - carbidopa-levodopa (SINEMET IR) 25-100 MG per tablet; One up to 3 time daily before meals to help prevent leg spasm  Dispense: 90 tablet; Refill: 4  8. Shortness of breath Persistent issue  9. Chest pain, unspecified chest pain type Persistent issue to be discussed with cardiologist  10. Non-traumatic rhabdomyolysis Resolved  11. Tension headache Observe  12. Cervicalgia Observe  13. Pain in joint, shoulder region, unspecified laterality Observe

## 2014-04-08 ENCOUNTER — Inpatient Hospital Stay (HOSPITAL_COMMUNITY)
Admission: AD | Admit: 2014-04-08 | Discharge: 2014-04-09 | DRG: 247 | Disposition: A | Discharge status: Home / Self Care | Payer: 59 | Source: Ambulatory Visit | Attending: Cardiology | Admitting: Cardiology

## 2014-04-08 ENCOUNTER — Other Ambulatory Visit: Payer: Self-pay | Admitting: Cardiology

## 2014-04-08 ENCOUNTER — Encounter (HOSPITAL_COMMUNITY)
Admission: AD | Disposition: A | Discharge status: Home / Self Care | Payer: Self-pay | Source: Ambulatory Visit | Attending: Cardiology

## 2014-04-08 ENCOUNTER — Telehealth: Payer: Self-pay | Admitting: *Deleted

## 2014-04-08 DIAGNOSIS — I10 Essential (primary) hypertension: Secondary | ICD-10-CM | POA: Diagnosis present

## 2014-04-08 DIAGNOSIS — Z794 Long term (current) use of insulin: Secondary | ICD-10-CM

## 2014-04-08 DIAGNOSIS — E1165 Type 2 diabetes mellitus with hyperglycemia: Secondary | ICD-10-CM | POA: Diagnosis present

## 2014-04-08 DIAGNOSIS — T82897A Other specified complication of cardiac prosthetic devices, implants and grafts, initial encounter: Secondary | ICD-10-CM

## 2014-04-08 DIAGNOSIS — K219 Gastro-esophageal reflux disease without esophagitis: Secondary | ICD-10-CM | POA: Diagnosis present

## 2014-04-08 DIAGNOSIS — E785 Hyperlipidemia, unspecified: Secondary | ICD-10-CM | POA: Diagnosis present

## 2014-04-08 DIAGNOSIS — Z7982 Long term (current) use of aspirin: Secondary | ICD-10-CM

## 2014-04-08 DIAGNOSIS — G2581 Restless legs syndrome: Secondary | ICD-10-CM | POA: Diagnosis present

## 2014-04-08 DIAGNOSIS — I200 Unstable angina: Secondary | ICD-10-CM | POA: Insufficient documentation

## 2014-04-08 DIAGNOSIS — J449 Chronic obstructive pulmonary disease, unspecified: Secondary | ICD-10-CM | POA: Diagnosis present

## 2014-04-08 DIAGNOSIS — Z79899 Other long term (current) drug therapy: Secondary | ICD-10-CM

## 2014-04-08 DIAGNOSIS — I25110 Atherosclerotic heart disease of native coronary artery with unstable angina pectoris: Secondary | ICD-10-CM | POA: Diagnosis present

## 2014-04-08 DIAGNOSIS — R0789 Other chest pain: Secondary | ICD-10-CM | POA: Diagnosis present

## 2014-04-08 DIAGNOSIS — Z955 Presence of coronary angioplasty implant and graft: Secondary | ICD-10-CM

## 2014-04-08 HISTORY — DX: Unstable angina: I20.0

## 2014-04-08 HISTORY — PX: LEFT HEART CATHETERIZATION WITH CORONARY ANGIOGRAM: SHX5451

## 2014-04-08 LAB — MRSA PCR SCREENING: MRSA BY PCR: NEGATIVE

## 2014-04-08 LAB — TROPONIN I: Troponin I: <0.03 ng/mL (ref <0.031)

## 2014-04-08 LAB — GLUCOSE, CAPILLARY: Glucose-Capillary: 81 mg/dL (ref 70–99)

## 2014-04-08 SURGERY — LEFT HEART CATHETERIZATION WITH CORONARY ANGIOGRAM
Anesthesia: LOCAL

## 2014-04-08 MED ORDER — ACETAMINOPHEN 325 MG PO TABS: 650.0000 mg | ORAL_TABLET | ORAL | Status: DC | PRN

## 2014-04-08 MED ORDER — SODIUM CHLORIDE 0.9 % IV SOLN: Freq: Once | INTRAVENOUS | Status: DC

## 2014-04-08 MED ORDER — VERAPAMIL HCL 2.5 MG/ML IV SOLN
INTRAVENOUS | Status: AC
  Filled 2014-04-08: qty 2

## 2014-04-08 MED ORDER — MIDAZOLAM HCL 2 MG/2ML IJ SOLN
INTRAMUSCULAR | Status: AC
  Filled 2014-04-08: qty 2

## 2014-04-08 MED ORDER — SODIUM CHLORIDE 0.9 % IJ SOLN: 3.0000 mL | INTRAMUSCULAR | Status: DC | PRN

## 2014-04-08 MED ORDER — HEPARIN SODIUM (PORCINE) 1000 UNIT/ML IJ SOLN
INTRAMUSCULAR | Status: AC
  Filled 2014-04-08: qty 1

## 2014-04-08 MED ORDER — OXYCODONE-ACETAMINOPHEN 5-325 MG PO TABS: 1.0000 | ORAL_TABLET | ORAL | Status: DC | PRN

## 2014-04-08 MED ORDER — SODIUM CHLORIDE 0.9 % IJ SOLN: 3.0000 mL | Freq: Two times a day (BID) | INTRAMUSCULAR | Status: DC

## 2014-04-08 MED ORDER — LIDOCAINE HCL (PF) 1 % IJ SOLN
INTRAMUSCULAR | Status: AC
  Filled 2014-04-08: qty 30

## 2014-04-08 MED ORDER — SODIUM CHLORIDE 0.9 % IV SOLN: 250.0000 mL | INTRAVENOUS | Status: DC | PRN

## 2014-04-08 MED ORDER — HYDROMORPHONE HCL 1 MG/ML IJ SOLN
0.5000 mg | Freq: Once | INTRAMUSCULAR | Status: AC
  Administered 2014-04-08: 0.5 mg via INTRAVENOUS
  Filled 2014-04-08: qty 1

## 2014-04-08 MED ORDER — PANTOPRAZOLE SODIUM 40 MG PO TBEC
40.0000 mg | DELAYED_RELEASE_TABLET | Freq: Two times a day (BID) | ORAL | Status: DC
Start: 2014-04-08 — End: 2014-04-09
  Administered 2014-04-08 – 2014-04-09 (×2): 40 mg via ORAL
  Filled 2014-04-08: qty 1

## 2014-04-08 MED ORDER — SODIUM CHLORIDE 0.9 % IJ SOLN
3.0000 mL | Freq: Two times a day (BID) | INTRAMUSCULAR | Status: DC
  Administered 2014-04-09: 3 mL via INTRAVENOUS

## 2014-04-08 MED ORDER — ASPIRIN 81 MG PO CHEW
324.0000 mg | CHEWABLE_TABLET | ORAL | Status: AC
  Administered 2014-04-08: 324 mg via ORAL
  Filled 2014-04-08: qty 4

## 2014-04-08 MED ORDER — ONDANSETRON HCL 4 MG/2ML IJ SOLN: 4.0000 mg | Freq: Four times a day (QID) | INTRAMUSCULAR | Status: DC | PRN

## 2014-04-08 MED ORDER — HEPARIN (PORCINE) IN NACL 100-0.45 UNIT/ML-% IJ SOLN
1150.0000 [IU]/h | INTRAMUSCULAR | Status: DC
  Filled 2014-04-08: qty 250

## 2014-04-08 MED ORDER — ASPIRIN EC 81 MG PO TBEC
81.0000 mg | DELAYED_RELEASE_TABLET | Freq: Every day | ORAL | Status: DC
  Administered 2014-04-09: 81 mg via ORAL
  Filled 2014-04-08: qty 1

## 2014-04-08 MED ORDER — GLYBURIDE 2.5 MG PO TABS
2.5000 mg | ORAL_TABLET | Freq: Every day | ORAL | Status: DC
  Administered 2014-04-09: 2.5 mg via ORAL
  Filled 2014-04-08 (×2): qty 1

## 2014-04-08 MED ORDER — NITROGLYCERIN 1 MG/10 ML FOR IR/CATH LAB
INTRA_ARTERIAL | Status: AC
  Filled 2014-04-08: qty 10

## 2014-04-08 MED ORDER — ASPIRIN 300 MG RE SUPP
300.0000 mg | RECTAL | Status: AC
  Filled 2014-04-08: qty 1

## 2014-04-08 MED ORDER — INSULIN GLARGINE 100 UNIT/ML ~~LOC~~ SOLN
26.0000 [IU] | Freq: Every day | SUBCUTANEOUS | Status: DC
  Administered 2014-04-08: 26 [IU] via SUBCUTANEOUS
  Filled 2014-04-08 (×2): qty 0.26

## 2014-04-08 MED ORDER — INSULIN GLARGINE 100 UNIT/ML ~~LOC~~ SOLN
24.0000 [IU] | Freq: Every day | SUBCUTANEOUS | Status: DC
  Administered 2014-04-09: 24 [IU] via SUBCUTANEOUS
  Filled 2014-04-08: qty 0.24

## 2014-04-08 MED ORDER — INSULIN GLARGINE 100 UNITS/ML SOLOSTAR PEN: 20.0000 [IU] | PEN_INJECTOR | Freq: Two times a day (BID) | SUBCUTANEOUS | Status: DC

## 2014-04-08 MED ORDER — CARBIDOPA-LEVODOPA 25-100 MG PO TABS
1.0000 | ORAL_TABLET | Freq: Three times a day (TID) | ORAL | Status: DC
  Administered 2014-04-09 (×2): 1 via ORAL
  Filled 2014-04-08 (×4): qty 1

## 2014-04-08 MED ORDER — PRAMIPEXOLE DIHYDROCHLORIDE 0.25 MG PO TABS
0.7500 mg | ORAL_TABLET | Freq: Two times a day (BID) | ORAL | Status: DC
  Administered 2014-04-09: 0.75 mg via ORAL
  Filled 2014-04-08 (×3): qty 3

## 2014-04-08 MED ORDER — CARVEDILOL 6.25 MG PO TABS
6.2500 mg | ORAL_TABLET | Freq: Two times a day (BID) | ORAL | Status: DC
  Administered 2014-04-09: 6.25 mg via ORAL
  Filled 2014-04-08 (×4): qty 1

## 2014-04-08 MED ORDER — SODIUM CHLORIDE 0.9 % IV SOLN
1.0000 mL/kg/h | INTRAVENOUS | Status: AC
  Administered 2014-04-08 (×2): 1 mL/kg/h via INTRAVENOUS

## 2014-04-08 MED ORDER — MORPHINE SULFATE 2 MG/ML IJ SOLN: 2.0000 mg | INTRAMUSCULAR | Status: DC | PRN

## 2014-04-08 MED ORDER — INSULIN ASPART 100 UNIT/ML ~~LOC~~ SOLN: 0.0000 [IU] | Freq: Three times a day (TID) | SUBCUTANEOUS | Status: DC

## 2014-04-08 MED ORDER — HEPARIN (PORCINE) IN NACL 2-0.9 UNIT/ML-% IJ SOLN
INTRAMUSCULAR | Status: AC
  Filled 2014-04-08: qty 1500

## 2014-04-08 MED ORDER — NITROGLYCERIN 0.4 MG SL SUBL
0.4000 mg | SUBLINGUAL_TABLET | SUBLINGUAL | Status: DC | PRN
  Administered 2014-04-08 – 2014-04-09 (×5): 0.4 mg via SUBLINGUAL
  Filled 2014-04-08 (×3): qty 1

## 2014-04-08 MED ORDER — HEPARIN BOLUS VIA INFUSION
4000.0000 [IU] | Freq: Once | INTRAVENOUS | Status: DC
  Filled 2014-04-08: qty 4000

## 2014-04-08 MED ORDER — PRAMIPEXOLE DIHYDROCHLORIDE 0.25 MG PO TABS
0.3750 mg | ORAL_TABLET | Freq: Two times a day (BID) | ORAL | Status: DC
  Administered 2014-04-08 – 2014-04-09 (×2): 0.375 mg via ORAL
  Filled 2014-04-08 (×4): qty 1

## 2014-04-08 MED ORDER — ONDANSETRON HCL 4 MG/2ML IJ SOLN
INTRAMUSCULAR | Status: AC
  Filled 2014-04-08: qty 2

## 2014-04-08 MED ORDER — FENTANYL CITRATE 0.05 MG/ML IJ SOLN
INTRAMUSCULAR | Status: AC
  Filled 2014-04-08: qty 2

## 2014-04-08 MED ORDER — PANTOPRAZOLE SODIUM 40 MG PO TBEC
40.0000 mg | DELAYED_RELEASE_TABLET | Freq: Every day | ORAL | Status: DC
  Filled 2014-04-08: qty 1

## 2014-04-08 MED ORDER — PRASUGREL HCL 10 MG PO TABS
10.0000 mg | ORAL_TABLET | Freq: Every day | ORAL | Status: DC
  Administered 2014-04-09: 10 mg via ORAL
  Filled 2014-04-08: qty 1

## 2014-04-08 MED ORDER — HYDROMORPHONE HCL 1 MG/ML IJ SOLN
INTRAMUSCULAR | Status: AC
  Filled 2014-04-08: qty 1

## 2014-04-08 MED ORDER — SODIUM CHLORIDE 0.9 % IV SOLN: INTRAVENOUS | Status: DC

## 2014-04-08 NOTE — Progress Notes (Addendum)
Heparin never started due to pt taken emergently to cath lab.  ANTICOAGULATION CONSULT NOTE - Initial Consult  Pharmacy Consult for Heparin Indication: chest pain/ACS  Allergies  Allergen Reactions  . Statins Other (See Comments)    Causes Restless Legs    Patient Measurements: Height: 5\' 6"  (167.6 cm) Weight: 190 lb (86.183 kg) IBW/kg (Calculated) : 63.8 Heparin Dosing Weight: 82 kg  Vital Signs: Temp: 97.7 F (36.5 C) (02/05 1516) Temp Source: Oral (02/05 1516) BP: 134/73 mmHg (02/05 1539) Pulse Rate: 56 (02/05 1539)  Labs: No results for input(s): HGB, HCT, PLT, APTT, LABPROT, INR, HEPARINUNFRC, CREATININE, CKTOTAL, CKMB, TROPONINI in the last 72 hours.  Estimated Creatinine Clearance: 71.2 mL/min (by C-G formula based on Cr of 1.08).   Medical History: Past Medical History  Diagnosis Date  . Restless leg   . Cervicalgia   . Loss of weight   . Impotence of organic origin   . Umbilical hernia without mention of obstruction or gangrene   . Barrett's esophagus   . Unspecified gastritis and gastroduodenitis with hemorrhage   . Hyperlipidemia   . Other malaise and fatigue   . Hypoglycemia, unspecified   . GERD (gastroesophageal reflux disease)   . Complication of anesthesia     " i DO NOT WAKE UP VERY WELL "  . PONV (postoperative nausea and vomiting)   . Hypertension   . Heart murmur     "grew out of it" (12/15/2012)  . Chronic bronchitis     "not bad lately; used to get it 3-4 times/yr" (12/15/2012)  . IDDM (insulin dependent diabetes mellitus)   . H/O hiatal hernia   . Tension headache   . Headache     "weekly" (12/15/2012)  . COPD (chronic obstructive pulmonary disease)     Medications:  Prescriptions prior to admission  Medication Sig Dispense Refill Last Dose  . acetaminophen (TYLENOL) 500 MG tablet Take 1,000-1,500 mg by mouth every 6 (six) hours as needed for headache.   Taking  . aspirin 81 MG EC tablet Take 1 tablet (81 mg total) by mouth  daily. 60 tablet 0 Taking  . carbidopa-levodopa (SINEMET IR) 25-100 MG per tablet One up to 3 time daily before meals to help prevent leg spasm 90 tablet 4   . carvedilol (COREG) 6.25 MG tablet Take 0.5 tablets (3.125 mg total) by mouth 2 (two) times daily with a meal. 30 tablet 0 Taking  . glyBURIDE (DIABETA) 2.5 MG tablet TAKE 1 TABLET (2.5 MG TOTAL) BY MOUTH DAILY WITH BREAKFAST. 30 tablet 3 Taking  . insulin glargine (LANTUS) 100 unit/mL SOPN Inject 20-26 Units into the skin 2 (two) times daily. 24 units in the morning and 26 units in the evening.    Taking  . nitroGLYCERIN (NITROSTAT) 0.4 MG SL tablet Place 0.4 mg under the tongue every 5 (five) minutes as needed for chest pain.   Taking  . ONE TOUCH ULTRA TEST test strip   2 Taking  . pantoprazole (PROTONIX) 40 MG tablet TAKE 1 TABLET (40 MG TOTAL) BY MOUTH 2 (TWO) TIMES DAILY. 60 tablet 5 Taking  . pramipexole (MIRAPEX) 0.75 MG tablet Take 0.75 mg by mouth 2 (two) times daily.  4 Taking  . prasugrel (EFFIENT) 10 MG TABS tablet Take 1 tablet (10 mg total) by mouth daily. 30 tablet 0 Taking  . traMADol (ULTRAM) 50 MG tablet 1 tablet at bedtime 30 tablet 3 Taking    Assessment: 64 y.o. male presents as direct admit from  [facility] with chest pain. To begin heparin for r/o ACS. H/H wnl on 2/1. No anticoagulants PTA.  Goal of Therapy:  Heparin level 0.3-0.7 units/ml Monitor platelets by anticoagulation protocol: Yes   Plan:  Heparin 4000 units IV bolus Heparin gtt at 1150 units/hr F/u 6 hr heparin level Daily heparin level and CBC   Christoper Fabian, PharmD, BCPS Clinical pharmacist, pager [phone] 04/08/2014,3:39 PM

## 2014-04-08 NOTE — CV Procedure (Signed)
Cardiac Catheterization Procedure Note  Name: [name] MRN: [id number] DOB: December 17, 1950  Procedure: Left Heart Cath, Selective Coronary Angiography, LV angiography, PTCA and stenting of the LCx  Indication: USAP  Procedural Details:  The right wrist was prepped, draped, and anesthetized with 1% lidocaine. Using the modified Seldinger technique, a 5/6 French Slender sheath was introduced into the right radial artery. 3 mg of verapamil was administered through the sheath, weight-based unfractionated heparin was administered intravenously. Standard Judkins catheters were used for selective coronary angiography and left ventriculography. Catheter exchanges were performed over an exchange length guidewire.  PROCEDURAL FINDINGS Hemodynamics: AO 123/74 LV 123/10   Coronary angiography: Coronary dominance: right  Left mainstem: The LAD and left circumflex have separate ostia  Left anterior descending (LAD): The proximal LAD has 50% stenosis before the stented segment. The stented segment of LAD arising just after the first diagonal branch is widely patent with minor in-stent restenosis. The LAD is patent throughout its remaining portions. There is an area of tortuosity in the mid LAD at a previous angioplasty site where there is 40-50% stenosis right in the bend vessel. The diagonal branch is widely patent.  Left circumflex (LCx): The left circumflex as mild stenosis proximally of 30%. The mid circumflex at the first OM bifurcation point has an eccentric 95% stenosis. This extends into the ostium of the large OM branch where there is 80-90% stenosis present. The extension of the mid AV circumflex supplies only a small posterolateral branch.  Right coronary artery (RCA): The RCA is dominant. In the proximal vessel at the first bend in the RCA there is 30-40% stenosis. The mid vessel near the junction of the distal vessel has 30-40% stenosis. The stented segment in the distal RCA extending in to  the PDA is widely patent without restenosis. There is a step down off the distal edge of the stent with mild nonobstructive stenosis present. The PDA is patent. The PLA branch is very small in caliber.  Left ventriculography: Left ventricular systolic function is normal, LVEF is estimated at 65-75%, there is no significant mitral regurgitation   PCI Note:  Following the diagnostic procedure, the decision was made to proceed with PCI of the LCx.  The patient is maintained on long-term dual antiplatelet therapy with aspirin and prasugrel. Weight-based heparin was given for anticoagulation. The ACT was confirmed at > 250. Once a therapeutic ACT was achieved, a 6 [place] XB 3.5 cm guide catheter was inserted.  Initially, a cougar wire was used. However, this would not cross the lesion. A whisper wire then cross the lesion without too much difficulty but would only go into the continuation of the AV circumflex. A second wire was advanced and with a moderate amount of difficulty was passed beyond the lesion into the more dominant, larger proximal OM branch.  The lesion was predilated with a 2.5 mm balloon.  The lesion was then stented with a 3.5 x 16 mm Promus DES.  Almost immediately after stent deployment, the patient developed severe chest pain. There was a marked increase in blood pressure. There was no obvious angiographic problem as the flow in the circumflex was normal. There was no sidebranch compromise. There is no evidence of dissection or thrombus. Despite this, the patient was in extreme pain and was treated with IV analgesics aggressively. The stent was postdilated with a 3.75 mm noncompliant balloon.  Following PCI, there was 0% residual stenosis and TIMI-3 flow. There was only minor narrowing with outflow obstruction at  the AV circumflex as it arises from the stented segment. Final angiography confirmed an excellent result. Multiple angiographic images were then taken to make sure that there was no  compromise or flow abnormality in the LAD. The patient's coronary flow and anatomy appeared stable. There were no immediate procedural complications. A TR band was used for radial hemostasis. The patient was transferred to the post catheterization recovery area for further monitoring.  PCI Data: Vessel - left circumflex/Segment - mid Percent Stenosis (pre)  95 TIMI-flow 3 Stent 3.5x16 mm Promus DES Percent Stenosis (post) 0 TIMI-flow (post) 3  Estimated Blood Loss: minimal  Final Conclusions:   1. 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 Continued patency of the stented segments in the LAD and right coronary arteries with moderate proximal LAD stenosis and mild nonobstructive RCA stenosis 3. Vigorous LV systolic function   Recommendations:  The patient will continue on aspirin and Effient. For post PCI care, he will be transferred to the cardiac stepdown bed because of his significant chest pain that occurred on the table. He is now hemodynamically stable without chest discomfort. Further disposition per Dr. Jacinto HalimGanji.  Kenneth BollmanMichael Helio Lack MD, [facility]ACC 04/08/2014, 7:23 PM

## 2014-04-08 NOTE — Progress Notes (Signed)
Pt given total 3 SL nitro and IV dilaudid; Dr. Nadara EatonGangi at bedside; pt to have cardiac cath done; will cont. To monitor; pt still c/o CP 3/10.

## 2014-04-08 NOTE — Progress Notes (Signed)
Pt c/o CP 7/10; pt given 1 SL nitro at this time and ASA; will cont. To monitor.

## 2014-04-08 NOTE — Telephone Encounter (Signed)
Patient walked in and requested a referral to Dr. Jacinto HalimGanji at [facility]. He has scheduled an appointment for Monday 04/11/14 but needs Referral from PCP. Check Dr. Thomasene LotGreen's chart note from previous OV and he does need to see cardiologist to discuss Chest pains. Referral placed.

## 2014-04-08 NOTE — H&P (Signed)
[name] is an 64 y.o. male.   Chief Complaint: Chest pain HPI: [name] is a 64 year old Caucasian male with history of known coronary artery disease and hyperlipidemia with statin intolerance due to rhabdomyolysis.  He had undergone multivessel coronary angioplasty on 12/15/2012 with implantation of 2.75 x 38 mm Promus Premier drug-eluting stent, balloon angioplasty of D1 and distal LAD and stenting of distal RCA with 2.75 x 24 mm Promus Premier drug-eluting stent. He underwent repeat coronary angiogram on 03/29/2013 with implantation of 2.75 x 38 mm Promus Premier drug-eluting stent to mid LAD, balloon angioplasty of D1 and distal LAD and stenting of distal RCA with 2.75 x 24 mm promos Premier drug-eluting stent. 50-60% residual midcircumflex stenosis. Not significant by FFR.  At his last office visit 3 months, he had attempted to restart Crestor to significant CAD with serial CK levels. His CK remained stable but he recently developed worsening restless leg symptoms and discontinued medication.  He reports chest pain today since 11:00am. He states for the past 2 months, he has had increasingly worsening chest heaviness with exertion.  His symptoms usually relieve with rest but have not today.  He has not taken any Nitro today. He has had 2 severe episodes of chest pain this week that required SL Ntg which did relieve pain. He reports associated SOB and radiation of pain down both arms.  He denies PND, orthopnea, SOB at rest, edema, dizziness, or symptoms suggestive of TIA or claudication.   Past Medical History  Diagnosis Date  . Restless leg   . Cervicalgia   . Loss of weight   . Impotence of organic origin   . Umbilical hernia without mention of obstruction or gangrene   . Barrett's esophagus   . Unspecified gastritis and gastroduodenitis with hemorrhage   . Hyperlipidemia   . Other malaise and fatigue   . Hypoglycemia, unspecified   . GERD (gastroesophageal reflux disease)   .  Complication of anesthesia     " i DO NOT WAKE UP VERY WELL "  . PONV (postoperative nausea and vomiting)   . Hypertension   . Heart murmur     "grew out of it" (12/15/2012)  . Chronic bronchitis     "not bad lately; used to get it 3-4 times/yr" (12/15/2012)  . IDDM (insulin dependent diabetes mellitus)   . H/O hiatal hernia   . Tension headache   . Headache     "weekly" (12/15/2012)  . COPD (chronic obstructive pulmonary disease)     Past Surgical History  Procedure Laterality Date  . Knee surgery Right 1992 and 2000    "calcium deposits removed" (12/15/2012)  . Coronary angioplasty with stent placement  12/15/2012    "2 stents" (12/15/2012)  . Hernia repair  2010    "umbilical" (12/15/2012)  . Incision and drainage abscess Left 05/2007    "groin" (12/15/2012)  . Left heart catheterization with coronary angiogram N/A 12/15/2012    Procedure: LEFT HEART CATHETERIZATION WITH CORONARY ANGIOGRAM;  Surgeon: Pamella Pert, MD;  Location: [facility] CATH LAB;  Service: Cardiovascular;  Laterality: N/A;  . Left heart catheterization with coronary angiogram N/A 03/26/2013    Procedure: LEFT HEART CATHETERIZATION WITH CORONARY ANGIOGRAM;  Surgeon: Pamella Pert, MD;  Location: [facility] CATH LAB;  Service: Cardiovascular;  Laterality: N/A;  . Fractional flow reserve wire Right 03/26/2013    Procedure: FRACTIONAL FLOW RESERVE WIRE;  Surgeon: Pamella Pert, MD;  Location: [facility] CATH LAB;  Service: Cardiovascular;  Laterality: Right;  No family history on file. Social History:  reports that he has never smoked. He has never used smokeless tobacco. He reports that he does not drink alcohol or use illicit drugs.  Allergies:  Allergies  Allergen Reactions  . Statins Other (See Comments)    Causes Restless Legs    Medications Prior to Admission  Medication Sig Dispense Refill  . acetaminophen (TYLENOL) 500 MG tablet Take 1,000-1,500 mg by mouth every 6 (six) hours as needed for headache.    Marland Kitchen  aspirin 81 MG EC tablet Take 1 tablet (81 mg total) by mouth daily. 60 tablet 0  . carbidopa-levodopa (SINEMET IR) 25-100 MG per tablet One up to 3 time daily before meals to help prevent leg spasm 90 tablet 4  . carvedilol (COREG) 6.25 MG tablet Take 0.5 tablets (3.125 mg total) by mouth 2 (two) times daily with a meal. 30 tablet 0  . glyBURIDE (DIABETA) 2.5 MG tablet TAKE 1 TABLET (2.5 MG TOTAL) BY MOUTH DAILY WITH BREAKFAST. 30 tablet 3  . insulin glargine (LANTUS) 100 unit/mL SOPN Inject 20-26 Units into the skin 2 (two) times daily. 24 units in the morning and 26 units in the evening.     . nitroGLYCERIN (NITROSTAT) 0.4 MG SL tablet Place 0.4 mg under the tongue every 5 (five) minutes as needed for chest pain.    . ONE TOUCH ULTRA TEST test strip   2  . pantoprazole (PROTONIX) 40 MG tablet TAKE 1 TABLET (40 MG TOTAL) BY MOUTH 2 (TWO) TIMES DAILY. 60 tablet 5  . pramipexole (MIRAPEX) 0.75 MG tablet Take 0.75 mg by mouth 2 (two) times daily.  4  . prasugrel (EFFIENT) 10 MG TABS tablet Take 1 tablet (10 mg total) by mouth daily. 30 tablet 0  . traMADol (ULTRAM) 50 MG tablet 1 tablet at bedtime 30 tablet 3   Review of Systems  General:Not Present- Fever and Night Sweats. Skin:Not Present- Itching and Rash. HEENT:Not Present- Headache. Respiratory:Present- Decreased Exercise Tolerance and Difficulty Breathing on Exertion. Cardiovascular:Present- Chest Pain. Not Present- Claudications, Fainting, Orthopnea and Swelling of Extremities. Gastrointestinal:Not Present- Abdominal Pain, Constipation, Diarrhea, Nausea and Vomiting. Musculoskeletal:Not Present- Joint Swelling. Neurological:Present- Muscle Twitching. Not Present- Headaches. Hematology:Not Present- Blood Clots, Easy Bleeding and Nose Bleed.  Blood pressure 120/74, pulse 75, temperature 97.7 F (36.5 C), temperature source Oral, resp. rate 19, height  (1.676 m), weight 86.183 kg (190 lb), SpO2 98 %. General appearance:  alert, cooperative, appears stated age and no distress Neck: no adenopathy, no carotid bruit, no JVD, supple, symmetrical, trachea midline and thyroid not enlarged, symmetric, no tenderness/mass/nodules Resp: clear to auscultation bilaterally Chest wall: no tenderness Cardio: regular rate and rhythm, S1, S2 normal, no murmur, click, rub or gallop GI: soft, non-tender; bowel sounds normal; no masses,  no organomegaly Extremities: extremities normal, atraumatic, no cyanosis or edema Pulses: 2+ and symmetric Skin: Skin color, texture, turgor normal. No rashes or lesions Neurologic: Grossly normal  No results found for this or any previous visit (from the past 48 hour(s)). No results found.  Labs:   Lab Results  Component Value Date   WBC 4.4 04/04/2014   HGB 12.6 04/04/2014   HCT 37.8 04/04/2014   MCV 82 04/04/2014   PLT 125* 11/29/2013    Recent Labs Lab 04/04/14 0831  NA 138  K 4.5  CL 101  CO2 22  BUN 17  CREATININE 1.08  CALCIUM 9.0  PROT 6.7  BILITOT 0.4  ALKPHOS 58  ALT 19  AST 22  GLUCOSE 141*   Lab Results  Component Value Date   CKTOTAL 78 04/04/2014   TROPONINI <0.30 03/27/2013    Lipid Panel     Component Value Date/Time   CHOL 116 12/21/2012 0240   TRIG 161* 04/04/2014 0831   HDL 32* 04/04/2014 0831   HDL 33* 12/21/2012 0240   CHOLHDL 5.4* 04/04/2014 0831   CHOLHDL 3.5 12/21/2012 0240   VLDL 32 12/21/2012 0240   LDLCALC 108* 04/04/2014 0831   LDLCALC 51 12/21/2012 0240  EKG 04/08/2014 (in office): sinus rhythm at a rate of 68bpm, normal axis, normal intervals, borderline low voltage complexes, non-specific T abnormality in inferior leads. Compared to EKG on 10/06/2013, T wave abnormality new, however, was present on EKG on 12/23/2012..   Assessment/Plan 1. [place] 2. Hyperlipidemia 3. Diabetes type 2     Erling ConteAllison,April Carlyon Nicole, NP-C 04/08/2014, 4:13 PM [facility] Pager: [phone] Office: [phone] If no answer: Cell:   [phone]

## 2014-04-08 NOTE — Progress Notes (Signed)
Pt left the floor to cath lab via bed. Awake, alert, and oriented.

## 2014-04-08 NOTE — Progress Notes (Signed)
Received direct admit patient at 1515 via wheelchair, accompanied by volunteer and spouse; from Cardiologist office to be admitted due to chest pain.  Initial CP rated  at 7/10, heaviness in nature, non radiating, ongoing on / off for 2 weeks. Alert, oriented,  Able to make needs known.  Pt and spouse reported prior stents  / balloons x 2 in October 2014. Dr Jacinto HalimGanji paged and made aware.

## 2014-04-08 NOTE — Progress Notes (Signed)
Spoke with Dr. Nadara EatonGangi; pt given 2 SL nitro at this time; order for IV Dilaudid X1; will cont. To monitor.

## 2014-04-08 NOTE — Telephone Encounter (Signed)
I called patient insurance company and obtained Prior Auth. Patient is being seen today by Cardiologist. I spoke with Luna KitchensLatasha at [employer] and was approved 6 visits. Dr. Jacinto HalimGanji office notified.

## 2014-04-08 NOTE — Progress Notes (Signed)
Heparin not started per Dr. Jacinto HalimGanji verbal order due to Pt going to have emergent cardiac catheterization. Informed consent signed and R groins clipped. Awaiting call from cath lab.

## 2014-04-08 NOTE — H&P (Signed)
H&P from Dr Einar Gip reviewed and I agree as outlined. Pt has known CAD and classic symptoms of unstable angina. Plan for cath +/- PCI. Met with patient, wife, and son - they understand risks, benefits, alternatives and agree to proceed.  PCI DATA  Pre-Procedure Data: Indication: USAP Angina Class (CCS): 4 Pre Procedure Stress Test: none           If yes, Stress Test Risk Stratification:  Antianginal Rx (greater than or equal to 2 antianginals): 1  Sherren Mocha 04/08/2014 7:15 PM

## 2014-04-09 ENCOUNTER — Encounter (HOSPITAL_COMMUNITY): Payer: Self-pay | Admitting: Cardiovascular Disease

## 2014-04-09 LAB — GLUCOSE, CAPILLARY
GLUCOSE-CAPILLARY: 96 mg/dL (ref 70–99)
Glucose-Capillary: 116 mg/dL — ABNORMAL HIGH (ref 70–99)
Glucose-Capillary: 70 mg/dL (ref 70–99)

## 2014-04-09 LAB — CBC
HCT: 34.2 % — ABNORMAL LOW (ref 39.0–52.0)
HEMOGLOBIN: 11.4 g/dL — AB (ref 13.0–17.0)
MCH: 27.1 pg (ref 26.0–34.0)
MCHC: 33.3 g/dL (ref 30.0–36.0)
MCV: 81.4 fL (ref 78.0–100.0)
PLATELETS: 113 10*3/uL — AB (ref 150–400)
RBC: 4.2 MIL/uL — ABNORMAL LOW (ref 4.22–5.81)
RDW: 12.8 % (ref 11.5–15.5)
WBC: 4.4 10*3/uL (ref 4.0–10.5)

## 2014-04-09 LAB — BASIC METABOLIC PANEL
Anion gap: 3 — ABNORMAL LOW (ref 5–15)
BUN: 15 mg/dL (ref 6–23)
CALCIUM: 8.2 mg/dL — AB (ref 8.4–10.5)
CHLORIDE: 108 mmol/L (ref 96–112)
CO2: 26 mmol/L (ref 19–32)
CREATININE: 1.11 mg/dL (ref 0.50–1.35)
GFR calc Af Amer: 79 mL/min — ABNORMAL LOW (ref >90)
GFR, EST NON AFRICAN AMERICAN: 68 mL/min — AB (ref >90)
GLUCOSE: 114 mg/dL — AB (ref 70–99)
Potassium: 4 mmol/L (ref 3.5–5.1)
Sodium: 137 mmol/L (ref 135–145)

## 2014-04-09 LAB — TROPONIN I: Troponin I: 0.13 ng/mL — ABNORMAL HIGH (ref <0.031)

## 2014-04-09 NOTE — Progress Notes (Signed)
Pt has orders for discharge. Went over discharge instructions with patient. Pt verbalized understanding. IV's and lines removed. Vitals stable. Pt denies chest pain. Pt understood all instructions and agreed to sign.

## 2014-04-09 NOTE — Progress Notes (Signed)
Pt c/o heaviness in the chest. Gave nitro X2 with some relief. Offered 3rd nitro to pt and he refused. Reassessed pain at 0900 and pt denied pain.

## 2014-04-09 NOTE — Discharge Summary (Signed)
Physician Discharge Summary  Patient ID: [name] MRN: [id number] DOB/AGE: [birth year] 64 y.o.  Admit date: 04/08/2014 Discharge date: 04/09/2014  Primary Discharge Diagnosis: Unstable angina pectoris. CAD Coronary angiogram 04/08/2014: Cx/OM1 with 3.5 x 16 mm Promus DES.  Mid LAD(03/29/2013) 2.75 x 38 mm Promus Premier drug-eluting stent, balloon angioplasty of D1 and distal LAD(03/29/2013) and  Distal RCA( 12/15/2012) with 2.75 x 24 mm promos Premier drug-eluting stent widely patent. Progression of proximal LAD stenosis.  Secondary Discharge Diagnosis: Hyperlipidemia, HTN, DM uncontrolled, Statin intolerence  Significant Diagnostic Studies: Coronary angiogram 04/08/2014: 1. Severe left circumflex stenosis treated successfully with PCI using a single DES; 2. Continued patency of the stented segments in the LAD and right coronary arteries with moderate proximal LAD stenosis which is new and progressed from previous. Previous stents in the LAD and Cx and distal RCA patent. 3. Vigorous LV systolic function   Hospital Course:  [name] is a 63 year old Caucasian male with history of known coronary artery disease and hyperlipidemia with statin intolerance due to rhabdomyolysis.  He has has undergone 2 previous coronary angiograms with PCI, first on 12/15/2012 and the second on 03/29/2013.  He has been unable to take statins due to severe restless leg and leg pain.  He presented to the office on 04/08/2014 c/o worsening exertional chest pain over the past 2 months with SOB and radiation of pain down both arms.  It was decided that due to his significant history of CAD and hyperlipidemia as well as his presentation suspicious for [place], he should be taken directly to the cath lab for reevaluation of his coronary arteries.    Coronary angiogram revealed severe stenosis of the left circumflex which was successfully stented with a 3.5 x 16 mm Promus DES. Almost immediately after stent deployment, the patient developed  severe chest pain. There was a marked increase in blood pressure. There was no obvious angiographic problem as the flow in the circumflex was normal. There was no sidebranch compromise. There was no evidence of dissection or thrombus. Despite this, the patient was in extreme pain and was treated with IV analgesics aggressively. The stent was postdilated with a 3.75 mm noncompliant balloon.He was transferred to a cardiac stepdown bed.  The next morning, he had one episode of chest heaviness relieved by Nitro without any recurrence of pain.  He ambulated in the hallway without any further chest pain or SOB.    Recommendations on discharge:  Continue DAPT with ASA and Effient. Pt has SL ntg and is aware of when and how to take it.  Follow up outpatient in 7-10 days. I plan on re-study coronary angiogram for evaluation of the intermediated proximal LAD disease as discussed with Dr. Tonny Bollman.  Will likely need to consider initiating PCSK9 inhibitor due to statin intolerance and h/o rhabdomyalysis and significant CAD.    Discharge Exam: Blood pressure 116/64, pulse 65, temperature 97.4 F (36.3 C), temperature source Oral, resp. rate 13, height  (1.676 m), weight 85.7 kg (188 lb 15 oz), SpO2 97 %.    General appearance: alert, cooperative, appears stated age and no distress Neck: no adenopathy, no carotid bruit, no JVD, supple, symmetrical, trachea midline and thyroid not enlarged, symmetric, no tenderness/mass/nodules Resp: clear to auscultation bilaterally Chest wall: no tenderness Cardio: regular rate and rhythm, S1, S2 normal, no murmur, click, rub or gallop Extremities: extremities normal, atraumatic, no cyanosis or edema Pulses: 2+ and symmetric  Labs:   Lab Results  Component Value Date  WBC 4.4 04/09/2014   HGB 11.4* 04/09/2014   HCT 34.2* 04/09/2014   MCV 81.4 04/09/2014   PLT 113* 04/09/2014    Recent Labs Lab 04/04/14 0831 04/09/14 0203  NA 138 137  K 4.5 4.0  CL  101 108  CO2 22 26  BUN 17 15  CREATININE 1.08 1.11  CALCIUM 9.0 8.2*  PROT 6.7  --   BILITOT 0.4  --   ALKPHOS 58  --   ALT 19  --   AST 22  --   GLUCOSE 141* 114*   Lab Results  Component Value Date   CKTOTAL 78 04/04/2014   TROPONINI 0.13* 04/09/2014    Lipid Panel     Component Value Date/Time   CHOL 116 12/21/2012 0240   TRIG 161* 04/04/2014 0831   HDL 32* 04/04/2014 0831   HDL 33* 12/21/2012 0240   CHOLHDL 5.4* 04/04/2014 0831   CHOLHDL 3.5 12/21/2012 0240   VLDL 32 12/21/2012 0240   LDLCALC 108* 04/04/2014 0831   LDLCALC 51 12/21/2012 0240    EKG 04/09/2014 @ 1904: Normal sinus rhythm at a rate of 68 bpm, normal axis, normal intervals, nonspecific T-wave abnormality in inferior leads.  No significant change from prior.   FOLLOW UP PLANS AND APPOINTMENTS Discharge Instructions    Amb Referral to Cardiac Rehabilitation    Complete by:  As directed      Discharge patient    Complete by:  As directed             Medication List    TAKE these medications        acetaminophen 500 MG tablet  Commonly known as:  TYLENOL  Take 1,000-1,500 mg by mouth every 6 (six) hours as needed for headache.     aspirin 81 MG EC tablet  Take 1 tablet (81 mg total) by mouth daily.     carbidopa-levodopa 25-100 MG per tablet  Commonly known as:  SINEMET IR  One up to 3 time daily before meals to help prevent leg spasm     carvedilol 6.25 MG tablet  Commonly known as:  COREG  Take 0.5 tablets (3.125 mg total) by mouth 2 (two) times daily with a meal.     glyBURIDE 2.5 MG tablet  Commonly known as:  DIABETA  TAKE 1 TABLET (2.5 MG TOTAL) BY MOUTH DAILY WITH BREAKFAST.     insulin glargine 100 unit/mL Sopn  Commonly known as:  LANTUS  Inject 20-26 Units into the skin 2 (two) times daily. 24 units in the morning and 26 units in the evening.     nitroGLYCERIN 0.4 MG SL tablet  Commonly known as:  NITROSTAT  Place 0.4 mg under the tongue every 5 (five) minutes as needed  for chest pain.     pantoprazole 40 MG tablet  Commonly known as:  PROTONIX  TAKE 1 TABLET (40 MG TOTAL) BY MOUTH 2 (TWO) TIMES DAILY.     pramipexole 0.75 MG tablet  Commonly known as:  MIRAPEX  Take 0.75 mg by mouth 2 (two) times daily.     prasugrel 10 MG Tabs tablet  Commonly known as:  EFFIENT  Take 1 tablet (10 mg total) by mouth daily.           Follow-up Information    Follow up with Pamella Pert, MD.   Specialty:  Cardiology   Why:  In 7-10 days   Contact information:   [address] [place]  [id number] [phone]        Marcy SalvoBridgette Allison, NP-C  04/09/2014, 12:23 PM [facility] Pager: [phone] Office: [phone]

## 2014-04-09 NOTE — Discharge Instructions (Signed)
Coronary Angiogram A coronary angiogram, also called coronary angiography, is an X-ray procedure used to look at the arteries in the heart. In this procedure, a dye (contrast dye) is injected through a long, hollow tube (catheter). The catheter is about the size of a piece of cooked spaghetti and is inserted through your groin, wrist, or arm. The dye is injected into each artery, and X-rays are then taken to show if there is a blockage in the arteries of your heart. LET [facility] CARE PROVIDER KNOW ABOUT:  Any allergies you have, including allergies to shellfish or contrast dye.   All medicines you are taking, including vitamins, herbs, eye drops, creams, and over-the-counter medicines.   Previous problems you or members of your family have had with the use of anesthetics.   Any blood disorders you have.   Previous surgeries you have had.  History of kidney problems or failure.   Other medical conditions you have. RISKS AND COMPLICATIONS  Generally, a coronary angiogram is a safe procedure. However, problems can occur and include:  Allergic reaction to the dye.  Bleeding from the access site or other locations.  Kidney injury, especially in people with impaired kidney function.  Stroke (rare).  Heart attack (rare). BEFORE THE PROCEDURE   Do not eat or drink anything after midnight the night before the procedure or as directed by your health care provider.   Ask your health care provider about changing or stopping your regular medicines. This is especially important if you are taking diabetes medicines or blood thinners. PROCEDURE  You may be given a medicine to help you relax (sedative) before the procedure. This medicine is given through an intravenous (IV) access tube that is inserted into one of your veins.   The area where the catheter will be inserted will be washed and shaved. This is usually done in the groin but may be done in the fold of your arm (near your  elbow) or in the wrist.   A medicine will be given to numb the area where the catheter will be inserted (local anesthetic).   The health care provider will insert the catheter into an artery. The catheter will be guided by using a special type of X-ray (fluoroscopy) of the blood vessel being examined.   A special dye will then be injected into the catheter, and X-rays will be taken. The dye will help to show where any narrowing or blockages are located in the heart arteries.  AFTER THE PROCEDURE   If the procedure is done through the leg, you will be kept in bed lying flat for several hours. You will be instructed to not bend or cross your legs.  The insertion site will be checked frequently.   The pulse in your feet or wrist will be checked frequently.   Additional blood tests, X-rays, and an electrocardiogram may be done.  Document Released: 08/25/2002 Document Revised: 07/05/2013 Document Reviewed: 07/13/2012 [facility] Patient Information 2015 [place], [place]. This information is not intended to replace advice given to you by your health care provider. Make sure you discuss any questions you have with your health care provider.  Cardiac Rehabilitation Cardiac rehabilitation is a medically supervised program that helps improve the health and well-being of people with heart problems. Cardiac rehabilitation includes exercise training, education, and counseling to help you get stronger and return to an active lifestyle. People who participate in cardiac rehabilitation programs get better faster and reduce future hospital stays. Cardiac rehabilitation programs can help  when you have had the following conditions:  Heart attack.  Heart failure.  Peripheral artery disease.  Coronary artery disease.  Angina.  Lung or breathing problems. Cardiac rehabilitation programs are also used when you have the following procedures:  Coronary artery bypass graft surgery.  Heart valve  replacement.  Heart stent placement.  Heart transplant.  Aneurysm repair. CARDIAC REHABILITATION MAY HELP YOU:  Reduce problems like chest pain and trouble breathing.  Change risk factors that contribute to heart disease, such as:  Smoking.  High blood pressure.  High cholesterol.  Diabetes.  Being out of shape or not active.  Weighing more than 30% over your ideal weight.  Diet.  Improve your mental outlook so you feel:  Less depressed or "blue."  More hopeful.  Better about yourself.  More confident about taking care of yourself.  Get support from health experts as well as other people with similar problems.  Learn how to manage and understand your medicines.  Teach your family about your condition and how to participate in your recovery. WHAT HAPPENS IN CARDIAC REHABILITATION? You will be assessed by a cardiac rehabilitation team. They will check your health history and do a physical exam. You may need blood tests, stress tests, and other evaluations. You may not start a cardiac rehabilitation program if:  You develop angina with exercise or while at rest.  You have severe heart failure that limits your activity.  You have an abnormal heart rhythm at rest.  You develop heart rhythm problems during exercise.  You have high blood pressure that is not controlled. The cardiac rehabilitation team works with you to make a plan based on your health and goals. Everyone is unique, so each program is customized and your program may change as you progress. Members of a typical cardiac rehabilitation team may include such health professionals as:  Doctors.  Nurses.  Dietitians.  Psychologists.  Exercise specialists.  Physical and occupational therapists. A typical cardiac rehabilitation program is divided into phases. You advance from one phase to the next. Most cardiac rehabilitation sessions last for 60 minutes, 3 times a week.  Phase One starts while you  are still in the hospital. You may start by walking in your room and then in the hall. You may start some simple exercises with a therapist. Health care team members will give you information and ask you many questions. You may not be able to remember details, so have a family member or an advocate with you to help keep track of information.  Phase Two begins when you go home or to another facility. This phase may last 8 to 12 weeks. You will travel to a cardiac rehabilitation center or a place where it is offered. Typically, you gradually increase your activity while being closely watched by a nurse or therapist. Exercises may be a combination of strength or resistance training and "cardio" or aerobic movement on a treadmill or other machines. Your condition will determine how often and how long these sessions will last.  In phase two, you may learn how to cook healthy meals, control your blood sugar, and manage your medicines. You may need help with scheduling or planning how and when to take your medicines. Use a timer, divided pill box, or follow a form to make taking your medicines easier. Use the method that works best for you. Some medicines should not be taken with certain foods. If you take more than one blood pressure medicine, you may need to stagger  the times you take them. Taking all your blood pressure medicine at the same time may lower your blood pressure too much. If you have questions about your medicines, ask your health care provider questions until you understand.  Phase Three continues for the rest of your life. There will be less supervision. You may still participate in cardiac rehabilitation activities or become part of a group in your community. You may benefit from talking to other people about your experience if they are facing similar challenges. How soon you drive, have sex, or return to work will depend on your condition. These decisions should be made by you and your health care  provider. If you need help, ask for it. Find out where you can get the help you need. Ask questions until you get answers and understand. SEEK IMMEDIATE MEDICAL CARE IF:  Get medical help at once if you experience any of the following symptoms:  Severe chest discomfort, especially if the pain is crushing or pressure-like and spreads to the arms, back, neck, or jaw. Do not wait to see if the pain will go away.  Weakness or numbness in your face, arms, or legs, especially on one side of the body; slurred speech; confusion; sudden severe headache or loss of vision (all symptoms of stroke).  You have shortness of breath.  You are sweating and feel sick to your stomach (nausea).  You feel dizzy or faint.  You experience profound tiredness (fatigue). Call your local emergency service (911 in the [place] Do not drive yourself to the hospital. Document Released: 11/28/2007 Document Revised: 07/05/2013 Document Reviewed: 05/25/2010 [facility] Patient Information 2015 [place], [place]. This information is not intended to replace advice given to you by your health care provider. Make sure you discuss any questions you have with your health care provider.

## 2014-04-09 NOTE — Progress Notes (Signed)
CARDIAC REHAB PHASE I   PRE:  Rate/Rhythm: SR 66  BP:  Supine:   Sitting: 111/67  Standing:    SaO2: 100 2lnc  MODE:  Ambulation: 700 ft   POST:  Rate/Rhythm: 96  BP:  Supine:   Sitting: 108/75  Standing:    SaO2: 96 RA  Pt ambulated 700 ft x 1 minimal assist in hallway and up to BR for void without any complaints of cp or sob.  Reviewed with pt heart healthy diet with dm modifications, exercise guidelines, activity restrictions, medication compliance with anti-platelet therapy and ntg protocol with alert 911 for any unresolved cp.  Handouts provided, questions answered.  Pt agreed to have name and contact information given [facility]  Pt participated in CR in 2014.  Wife at bedside, pt on side of bed denies any discomfort or complaint. Alanson Alyarlette Carlton RN, BSN [phone]

## 2014-04-11 LAB — POCT ACTIVATED CLOTTING TIME
Activated Clotting Time: 269 seconds
Activated Clotting Time: 276 seconds

## 2014-05-03 ENCOUNTER — Encounter (HOSPITAL_COMMUNITY)
Admission: RE | Disposition: A | Discharge status: Home / Self Care | Payer: 59 | Source: Ambulatory Visit | Attending: Cardiology

## 2014-05-03 ENCOUNTER — Encounter (HOSPITAL_COMMUNITY): Payer: Self-pay | Admitting: Cardiology

## 2014-05-03 ENCOUNTER — Ambulatory Visit (HOSPITAL_COMMUNITY)
Admission: RE | Admit: 2014-05-03 | Discharge: 2014-05-04 | Disposition: A | Discharge status: Home / Self Care | Payer: 59 | Source: Ambulatory Visit | Attending: Cardiology | Admitting: Cardiology

## 2014-05-03 DIAGNOSIS — Z955 Presence of coronary angioplasty implant and graft: Secondary | ICD-10-CM | POA: Insufficient documentation

## 2014-05-03 DIAGNOSIS — G2581 Restless legs syndrome: Secondary | ICD-10-CM | POA: Insufficient documentation

## 2014-05-03 DIAGNOSIS — I10 Essential (primary) hypertension: Secondary | ICD-10-CM | POA: Diagnosis not present

## 2014-05-03 DIAGNOSIS — I25118 Atherosclerotic heart disease of native coronary artery with other forms of angina pectoris: Secondary | ICD-10-CM

## 2014-05-03 DIAGNOSIS — I25110 Atherosclerotic heart disease of native coronary artery with unstable angina pectoris: Secondary | ICD-10-CM | POA: Diagnosis not present

## 2014-05-03 DIAGNOSIS — E785 Hyperlipidemia, unspecified: Secondary | ICD-10-CM | POA: Diagnosis not present

## 2014-05-03 DIAGNOSIS — I2510 Atherosclerotic heart disease of native coronary artery without angina pectoris: Secondary | ICD-10-CM | POA: Diagnosis present

## 2014-05-03 DIAGNOSIS — E1165 Type 2 diabetes mellitus with hyperglycemia: Secondary | ICD-10-CM | POA: Insufficient documentation

## 2014-05-03 DIAGNOSIS — Z9861 Coronary angioplasty status: Secondary | ICD-10-CM

## 2014-05-03 HISTORY — PX: PERCUTANEOUS CORONARY STENT INTERVENTION (PCI-S): SHX5485

## 2014-05-03 HISTORY — DX: Atherosclerotic heart disease of native coronary artery without angina pectoris: I25.10

## 2014-05-03 HISTORY — DX: Family history of other specified conditions: Z84.89

## 2014-05-03 HISTORY — PX: FRACTIONAL FLOW RESERVE WIRE: SHX5839

## 2014-05-03 LAB — POCT ACTIVATED CLOTTING TIME: ACTIVATED CLOTTING TIME: 497 s

## 2014-05-03 LAB — GLUCOSE, CAPILLARY
GLUCOSE-CAPILLARY: 160 mg/dL — AB (ref 70–99)
GLUCOSE-CAPILLARY: 65 mg/dL — AB (ref 70–99)
GLUCOSE-CAPILLARY: 84 mg/dL (ref 70–99)
Glucose-Capillary: 215 mg/dL — ABNORMAL HIGH (ref 70–99)

## 2014-05-03 LAB — PROTIME-INR
INR: 1.14 (ref 0.00–1.49)
PROTHROMBIN TIME: 14.7 s (ref 11.6–15.2)

## 2014-05-03 LAB — MRSA PCR SCREENING: MRSA BY PCR: NEGATIVE

## 2014-05-03 SURGERY — PERCUTANEOUS CORONARY STENT INTERVENTION (PCI-S)
Anesthesia: LOCAL

## 2014-05-03 MED ORDER — SODIUM CHLORIDE 0.9 % IV SOLN: 250.0000 mL | INTRAVENOUS | Status: DC | PRN

## 2014-05-03 MED ORDER — ACETAMINOPHEN 325 MG PO TABS: 650.0000 mg | ORAL_TABLET | ORAL | Status: DC | PRN

## 2014-05-03 MED ORDER — SODIUM CHLORIDE 0.9 % IJ SOLN: 3.0000 mL | INTRAMUSCULAR | Status: DC | PRN

## 2014-05-03 MED ORDER — PRAMIPEXOLE DIHYDROCHLORIDE 0.25 MG PO TABS
0.3750 mg | ORAL_TABLET | Freq: Two times a day (BID) | ORAL | Status: DC | PRN
  Administered 2014-05-03: 0.375 mg via ORAL
  Filled 2014-05-03 (×2): qty 1

## 2014-05-03 MED ORDER — HYDROMORPHONE HCL 1 MG/ML IJ SOLN
INTRAMUSCULAR | Status: AC
  Filled 2014-05-03: qty 1

## 2014-05-03 MED ORDER — INSULIN ASPART 100 UNIT/ML ~~LOC~~ SOLN: 4.0000 [IU] | Freq: Three times a day (TID) | SUBCUTANEOUS | Status: DC

## 2014-05-03 MED ORDER — MIDAZOLAM HCL 2 MG/2ML IJ SOLN
INTRAMUSCULAR | Status: AC
  Filled 2014-05-03: qty 2

## 2014-05-03 MED ORDER — NITROGLYCERIN 0.4 MG SL SUBL: 0.4000 mg | SUBLINGUAL_TABLET | SUBLINGUAL | Status: DC | PRN

## 2014-05-03 MED ORDER — PANTOPRAZOLE SODIUM 40 MG PO TBEC
40.0000 mg | DELAYED_RELEASE_TABLET | Freq: Two times a day (BID) | ORAL | Status: DC
  Administered 2014-05-03: 19:00:00 40 mg via ORAL
  Filled 2014-05-03 (×2): qty 1

## 2014-05-03 MED ORDER — INSULIN GLARGINE 100 UNITS/ML SOLOSTAR PEN: 20.0000 [IU] | PEN_INJECTOR | Freq: Two times a day (BID) | SUBCUTANEOUS | Status: DC

## 2014-05-03 MED ORDER — CARBIDOPA-LEVODOPA ER 25-100 MG PO TBCR
1.0000 | EXTENDED_RELEASE_TABLET | Freq: Every day | ORAL | Status: DC
  Filled 2014-05-03: qty 1

## 2014-05-03 MED ORDER — INSULIN ASPART 100 UNIT/ML ~~LOC~~ SOLN
0.0000 [IU] | Freq: Three times a day (TID) | SUBCUTANEOUS | Status: DC
  Administered 2014-05-03: 7 [IU] via SUBCUTANEOUS

## 2014-05-03 MED ORDER — NITROGLYCERIN 1 MG/10 ML FOR IR/CATH LAB
INTRA_ARTERIAL | Status: AC
  Filled 2014-05-03: qty 10

## 2014-05-03 MED ORDER — LIDOCAINE HCL (PF) 1 % IJ SOLN
INTRAMUSCULAR | Status: AC
  Filled 2014-05-03: qty 30

## 2014-05-03 MED ORDER — SODIUM CHLORIDE 0.9 % IV SOLN: INTRAVENOUS | Status: DC

## 2014-05-03 MED ORDER — SODIUM CHLORIDE 0.9 % IV SOLN
Freq: Once | INTRAVENOUS | Status: AC
  Administered 2014-05-03: 08:00:00 via INTRAVENOUS

## 2014-05-03 MED ORDER — BIVALIRUDIN 250 MG IV SOLR
INTRAVENOUS | Status: AC
  Filled 2014-05-03: qty 250

## 2014-05-03 MED ORDER — VERAPAMIL HCL 2.5 MG/ML IV SOLN
INTRAVENOUS | Status: AC
  Filled 2014-05-03: qty 2

## 2014-05-03 MED ORDER — SODIUM CHLORIDE 0.9 % IJ SOLN: 3.0000 mL | Freq: Two times a day (BID) | INTRAMUSCULAR | Status: DC

## 2014-05-03 MED ORDER — GLYBURIDE 2.5 MG PO TABS
2.5000 mg | ORAL_TABLET | Freq: Every day | ORAL | Status: DC
  Filled 2014-05-03 (×2): qty 1

## 2014-05-03 MED ORDER — ONDANSETRON HCL 4 MG/2ML IJ SOLN: 4.0000 mg | Freq: Four times a day (QID) | INTRAMUSCULAR | Status: DC | PRN

## 2014-05-03 MED ORDER — PRAMIPEXOLE DIHYDROCHLORIDE 0.25 MG PO TABS: 0.7500 mg | ORAL_TABLET | Freq: Three times a day (TID) | ORAL | Status: DC

## 2014-05-03 MED ORDER — INSULIN GLARGINE 100 UNIT/ML ~~LOC~~ SOLN
26.0000 [IU] | Freq: Every day | SUBCUTANEOUS | Status: DC
  Administered 2014-05-03: 26 [IU] via SUBCUTANEOUS
  Filled 2014-05-03 (×2): qty 0.26

## 2014-05-03 MED ORDER — ASPIRIN 81 MG PO CHEW: 81.0000 mg | CHEWABLE_TABLET | ORAL | Status: DC

## 2014-05-03 MED ORDER — SODIUM CHLORIDE 0.9 % IV SOLN: 1.0000 mL/kg/h | INTRAVENOUS | Status: AC

## 2014-05-03 MED ORDER — ASPIRIN EC 81 MG PO TBEC
81.0000 mg | DELAYED_RELEASE_TABLET | Freq: Every day | ORAL | Status: DC
  Filled 2014-05-03: qty 1

## 2014-05-03 MED ORDER — PRASUGREL HCL 10 MG PO TABS
10.0000 mg | ORAL_TABLET | Freq: Every day | ORAL | Status: DC
  Filled 2014-05-03: qty 1

## 2014-05-03 MED ORDER — HYDROMORPHONE HCL 1 MG/ML IJ SOLN: 0.5000 mg | INTRAMUSCULAR | Status: DC | PRN

## 2014-05-03 MED ORDER — INSULIN GLARGINE 100 UNIT/ML ~~LOC~~ SOLN
20.0000 [IU] | Freq: Every day | SUBCUTANEOUS | Status: DC
  Filled 2014-05-03: qty 0.2

## 2014-05-03 MED ORDER — CARVEDILOL 3.125 MG PO TABS
3.1250 mg | ORAL_TABLET | Freq: Two times a day (BID) | ORAL | Status: DC
Start: 2014-05-03 — End: 2014-05-04
  Administered 2014-05-03: 3.125 mg via ORAL
  Filled 2014-05-03 (×2): qty 1

## 2014-05-03 MED ORDER — HEPARIN (PORCINE) IN NACL 2-0.9 UNIT/ML-% IJ SOLN
INTRAMUSCULAR | Status: AC
  Filled 2014-05-03: qty 1000

## 2014-05-03 NOTE — Progress Notes (Signed)
TR BAND REMOVAL  LOCATION:    right radial  DEFLATED PER PROTOCOL:    Yes.    TIME BAND OFF / DRESSING APPLIED:    1600   SITE UPON ARRIVAL:    Level 0  SITE AFTER BAND REMOVAL:    Level 0   CIRCULATION SENSATION AND MOVEMENT:    Within Normal Limits   Yes.    COMMENTS:   No change when checked at 1630. Dressing dry and intact and site wnls.

## 2014-05-03 NOTE — Interval H&P Note (Signed)
History and Physical Interval Note:  05/03/2014 9:47 AM  [name]  has presented today for surgery, with the diagnosis of cad  The various methods of treatment have been discussed with the patient and family. After consideration of risks, benefits and other options for treatment, the patient has consented to  Procedure(s): PERCUTANEOUS CORONARY STENT INTERVENTION (PCI-S) (N/A) FRACTIONAL FLOW RESERVE WIRE (N/A) and possible PCI  as a surgical intervention .  The patient's history has been reviewed, patient examined, no change in status, stable for surgery.  I have reviewed the patient's chart and labs.  Questions were answered to the patient's satisfaction.   Patient has had diagnostic angiogram and LAD proximal stenosis felt to be significant and hence scheduled for PCI to the LAD and if in doubt to proceed with FFR. 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 Discussed with patient and wife. Cath Lab Visit (complete for each Cath Lab visit)  Clinical Evaluation Leading to the Procedure:   ACS: No.  Non-ACS:    Anginal Classification: CCS III  Anti-ischemic medical therapy: Minimal Therapy (1 class of medications)  Non-Invasive Test Results: No non-invasive testing performed  Prior CABG: No previous CABG       [facility]landGANJI,JAGADEESH R

## 2014-05-03 NOTE — H&P (View-Only) (Signed)
H&P from Dr Ganji reviewed and I agree as outlined. Pt has known CAD and classic symptoms of unstable angina. Plan for cath +/- PCI. Met with patient, wife, and son - they understand risks, benefits, alternatives and agree to proceed.  PCI DATA  Pre-Procedure Data: Indication: USAP Angina Class (CCS): 4 Pre Procedure Stress Test: none           If yes, Stress Test Risk Stratification:  Antianginal Rx (greater than or equal to 2 antianginals): 1  [name] 04/08/2014 7:15 PM   

## 2014-05-03 NOTE — CV Procedure (Signed)
Procedure performed:  PTCA and stenting of the proximal LAD with implantation of a 3.0 x 12 mm Promus Premier DES.  Indication: Patient is a 64 year-old Caucasian male with history of hypertension,  hyperlipidemia,  Diabetes Mellitus   who presents with class III angina pectoris. Patient has long history of CAD with history of stenting of the mid LAD on 03/17/2012 with a 2.75 x 38 mm Promus premier DES. History of balloon angioplasty to the ostial D1 and distal LAD at the same time. He also has history of distal RCA stenting with a 2.7 for 24 mm Promus premier DES on 12/15/2012. He had presented with unstable angina pectoris and underwent PTCA and stenting of the mid circumflex coronary artery with a 3.5 x 16 mm Promus premier DES on 04/08/2014. At that time he was found to have intermediate stenosis in the proximal LAD. Due to ongoing class III symptoms of claudication, he was brought back to the coronary angiography suite with an eye towards reevaluation of the proximal LAD stenosis along with possible FFR guidance if it is intermediate stenosis.  Hemodynamic data: Aortic pressure was 121/71 with a mean of 91 mm mercury. There was no pressure gradient across the aortic valve.   Left main coronary artery is large and normal.  Circumflex coronary artery: A large vessel giving origin to a large obtuse marginal 1. The previously placed stent in the mid circumflex which is a 3.5 x 16 mm Promus premier DES placed on 04/08/2014 is widely patent.  LAD:  LAD gives origin to a large diagonal-1.  LAD has mild diffuse luminal irregularities. Proximal LAD has a 80% hazy stenosis. The mid LAD stenosis stenting that is 2.74 x 38 mm Promus on 03/17/2012 and the balloon angioplasty site of the D1 ostium and distal LAD are widely patent.  Interventional data: Successful PTCA and stenting of the proximal LAD with implantation of a 3.0 x 12 mm Promus premier DES. The stent was overlapped with the previously placed mid LAD  stent.  A total of 115 mL of contrast was utilized for interventional procedure.  Technique of cardiac catheterization:  Under sterile precautions using a 6 French right radial  arterial access, a 6 French sheath was introduced into the right radial artery. Technique of intervention:  Using a 6 [place] XB 3.5 guide catheter the left main  coronary  was selected and cannulated. Using Angiomax for anticoagulation, I utilized a cougar XT guidewire and across the left anterior descending coronary artery without any difficulty. I placed the tip of the wire into the distal  coronary artery. Angiography was performed.   Then I utilized a 2.5 x 10 mm Angiosculpt  balloon , I performed balloon angioplasty at a peak of 20 atmospheric pressure for 90 seconds. This was followed by implantation of a 3.0 x 12 mm Promus premier DES which was deployed at 11 atmospheric pressure for 30 seconds, followed by reinflation at the same site at 16 atmospheric pressure for 45 seconds. The balloon was then pushed distally and overlapping with the previously placed stent, a 17 atmospheric pressure balloon inflation was performed for 45 seconds following this intracoronary nitroglycerin was administered and angiography was performed. Excellent result was evident without any edge dissection. The guidewire was withdrawn, guide catheter disengaged and pulled out of the body over the J-wire. Patient tolerated the procedure. Hemostasis was obtained by applying TR band.  Disposition: Patient will be discharged in am unless complications with out-patient follow up.

## 2014-05-04 DIAGNOSIS — Z955 Presence of coronary angioplasty implant and graft: Secondary | ICD-10-CM | POA: Diagnosis not present

## 2014-05-04 DIAGNOSIS — G2581 Restless legs syndrome: Secondary | ICD-10-CM | POA: Diagnosis not present

## 2014-05-04 DIAGNOSIS — I25110 Atherosclerotic heart disease of native coronary artery with unstable angina pectoris: Secondary | ICD-10-CM | POA: Diagnosis not present

## 2014-05-04 DIAGNOSIS — E1165 Type 2 diabetes mellitus with hyperglycemia: Secondary | ICD-10-CM | POA: Diagnosis not present

## 2014-05-04 LAB — CBC
HCT: 34.9 % — ABNORMAL LOW (ref 39.0–52.0)
HEMOGLOBIN: 11.2 g/dL — AB (ref 13.0–17.0)
MCH: 26.5 pg (ref 26.0–34.0)
MCHC: 32.1 g/dL (ref 30.0–36.0)
MCV: 82.5 fL (ref 78.0–100.0)
Platelets: 112 10*3/uL — ABNORMAL LOW (ref 150–400)
RBC: 4.23 MIL/uL (ref 4.22–5.81)
RDW: 13.2 % (ref 11.5–15.5)
WBC: 4.4 10*3/uL (ref 4.0–10.5)

## 2014-05-04 LAB — BASIC METABOLIC PANEL
Anion gap: 7 (ref 5–15)
BUN: 13 mg/dL (ref 6–23)
CO2: 25 mmol/L (ref 19–32)
Calcium: 8.7 mg/dL (ref 8.4–10.5)
Chloride: 107 mmol/L (ref 96–112)
Creatinine, Ser: 1.06 mg/dL (ref 0.50–1.35)
GFR calc Af Amer: 84 mL/min — ABNORMAL LOW (ref >90)
GFR calc non Af Amer: 72 mL/min — ABNORMAL LOW (ref >90)
GLUCOSE: 114 mg/dL — AB (ref 70–99)
Potassium: 3.9 mmol/L (ref 3.5–5.1)
Sodium: 139 mmol/L (ref 135–145)

## 2014-05-04 LAB — GLUCOSE, CAPILLARY: Glucose-Capillary: 107 mg/dL — ABNORMAL HIGH (ref 70–99)

## 2014-05-04 MED FILL — Sodium Chloride IV Soln 0.9%: INTRAVENOUS | Qty: 50 | Status: AC

## 2014-05-04 NOTE — Discharge Instructions (Signed)
Cardiac Rehabilitation °Cardiac rehabilitation is a medically supervised program that helps improve the health and well-being of people with heart problems. Cardiac rehabilitation includes exercise training, education, and counseling to help you get stronger and return to an active lifestyle. People who participate in cardiac rehabilitation programs get better faster and reduce future hospital stays. °Cardiac rehabilitation programs can help when you have had the following conditions: °· Heart attack. °· Heart failure. °· Peripheral artery disease. °· Coronary artery disease. °· Angina. °· Lung or breathing problems. °Cardiac rehabilitation programs are also used when you have the following procedures: °· Coronary artery bypass graft surgery. °· Heart valve replacement. °· Heart stent placement. °· Heart transplant. °· Aneurysm repair. °CARDIAC REHABILITATION MAY HELP YOU: °· Reduce problems like chest pain and trouble breathing. °· Change risk factors that contribute to heart disease, such as: °¨ Smoking. °¨ High blood pressure. °¨ High cholesterol. °¨ Diabetes. °¨ Being out of shape or not active. °¨ Weighing more than 30% over your ideal weight. °¨ Diet. °· Improve your mental outlook so you feel: °¨ Less depressed or "blue." °¨ More hopeful. °¨ Better about yourself. °¨ More confident about taking care of yourself. °· Get support from health experts as well as other people with similar problems. °· Learn how to manage and understand your medicines. °· Teach your family about your condition and how to participate in your recovery. °WHAT HAPPENS IN CARDIAC REHABILITATION? °You will be assessed by a cardiac rehabilitation team. They will check your health history and do a physical exam. You may need blood tests, stress tests, and other evaluations. You may not start a cardiac rehabilitation program if: °· You develop angina with exercise or while at rest. °· You have severe heart failure that limits your  activity. °· You have an abnormal heart rhythm at rest. °· You develop heart rhythm problems during exercise. °· You have high blood pressure that is not controlled. °The cardiac rehabilitation team works with you to make a plan based on your health and goals. Everyone is unique, so each program is customized and your program may change as you progress. Members of a typical cardiac rehabilitation team may include such health professionals as: °· Doctors. °· Nurses. °· Dietitians. °· Psychologists. °· Exercise specialists. °· Physical and occupational therapists. °A typical cardiac rehabilitation program is divided into phases. You advance from one phase to the next. Most cardiac rehabilitation sessions last for 60 minutes, 3 times a week.  °Phase One starts while you are still in the hospital. You may start by walking in your room and then in the hall. You may start some simple exercises with a therapist. Health care team members will give you information and ask you many questions. You may not be able to remember details, so have a family member or an advocate with you to help keep track of information.  °Phase Two begins when you go home or to another facility. This phase may last 8 to 12 weeks. You will travel to a cardiac rehabilitation center or a place where it is offered. Typically, you gradually increase your activity while being closely watched by a nurse or therapist. Exercises may be a combination of strength or resistance training and "cardio" or aerobic movement on a treadmill or other machines. Your condition will determine how often and how long these sessions will last.  °In phase two, you may learn how to cook healthy meals, control your blood sugar, and manage your medicines. You may need   help with scheduling or planning how and when to take your medicines. Use a timer, divided pill box, or follow a form to make taking your medicines easier. Use the method that works best for you. Some medicines  should not be taken with certain foods. If you take more than one blood pressure medicine, you may need to stagger the times you take them. Taking all your blood pressure medicine at the same time may lower your blood pressure too much. If you have questions about your medicines, ask your health care provider questions until you understand.  °Phase Three continues for the rest of your life. There will be less supervision. You may still participate in cardiac rehabilitation activities or become part of a group in your community. You may benefit from talking to other people about your experience if they are facing similar challenges. °How soon you drive, have sex, or return to work will depend on your condition. These decisions should be made by you and your health care provider. If you need help, ask for it. Find out where you can get the help you need. Ask questions until you get answers and understand. °SEEK IMMEDIATE MEDICAL CARE IF:  °Get medical help at once if you experience any of the following symptoms: °· Severe chest discomfort, especially if the pain is crushing or pressure-like and spreads to the arms, back, neck, or jaw. Do not wait to see if the pain will go away. °· Weakness or numbness in your face, arms, or legs, especially on one side of the body; slurred speech; confusion; sudden severe headache or loss of vision (all symptoms of stroke). °· You have shortness of breath. °· You are sweating and feel sick to your stomach (nausea). °· You feel dizzy or faint. °· You experience profound tiredness (fatigue). °Call your local emergency service (911 in the [place] Do not drive yourself to the hospital. °Document Released: 11/28/2007 Document Revised: 07/05/2013 Document Reviewed: 05/25/2010 °ExitCare® Patient Information ©2015 [employer]. This information is not intended to replace advice given to you by your health care provider. Make sure you discuss any questions you have with your health care  provider. ° °

## 2014-05-04 NOTE — Care Management Note (Signed)
    Page 1 of 1   05/04/2014     10:29:01 AM CARE MANAGEMENT NOTE 05/04/2014  [name]   Account Number:  [id number]  Date Initiated:  05/04/2014  Documentation initiated by:  GRAVES-BIGELOW,Katlyne Nishida  Subjective/Objective Assessment:   Pt admitted for cp. Post cath.     Action/Plan:   Effient co pay card given to pt. Pt aware of cost below and to activate card before getting to pharmacy.   Anticipated DC Date:  05/04/2014   Anticipated DC Plan:  HOME/SELF CARE      DC Planning Services  CM consult  Medication Assistance      Choice offered to / List presented to:             Status of service:  Completed, signed off Medicare Important Message given?  NO (If response is "NO", the following Medicare IM given date fields will be blank) Date Medicare IM given:   Medicare IM given by:   Date Additional Medicare IM given:   Additional Medicare IM given by:    Discharge Disposition:  HOME/SELF CARE  Per UR Regulation:  Reviewed for med. necessity/level of care/duration of stay  If discussed at Long Length of Stay Meetings, dates discussed:    Comments:  per rep at [facility] rx:  effient: Tier 4/ no auth required unless taking more than 1 per day [phone]/ $160 30 day retail  patient can use:  [facility], [facility], and [facility]

## 2014-05-04 NOTE — Progress Notes (Signed)
[phone] Pt had walked independently without CP so did not walk. Pt just here recently so reviewed what pt felt he needed. Gave diabetic diet and reviewed carb counting. Discussed how to restart ex and reviewed NTG use. Pt in agreement to CRP 2 so will refer to [place]. Pt knows to take effient for stent. Luetta NuttingCharlene Gianno Volner RN BSN 05/04/2014 9:52 AM

## 2014-05-04 NOTE — Discharge Summary (Signed)
Physician Discharge Summary  Patient ID: [name] MRN: [id number] DOB/AGE: 1950/09/14 64 y.o.  Admit date: 05/03/2014 Discharge date: 05/04/2014  Primary Discharge Diagnosis: CAD Secondary Discharge Diagnosis: Hyperlipidemia, HTN, DM, statin intolerance  Significant Diagnostic Studies: Coronary angiogram 05/03/2014: Successful PTCA and stenting of the proximal LAD with implantation of a 3.0 x 12 mm Promus premier DES. The stent was overlapped with the previously placed mid LAD stent  Hospital Course: Patient is a 64 year-old Caucasian male with history of hypertension, hyperlipidemia, diabetes mellituswho presents with class III angina pectoris. Patient has long history of CAD with history of stenting of the mid LAD on 03/17/2012 with a 2.75 x 38 mm Promus premier DES. History of balloon angioplasty to the ostial D1 and distal LAD at the same time. He also has history of distal RCA stenting with a 2.7 for 24 mm Promus premier DES on 12/15/2012. He had presented with unstable angina pectoris and underwent PTCA and stenting of the mid circumflex coronary artery with a 3.5 x 16 mm Promus premier DES on 04/08/2014. At that time he was found to have intermediate stenosis in the proximal LAD. Due to ongoing class III symptoms of claudication, he was brought back to the coronary angiography suite with an eye towards reevaluation of the proximal LAD stenosis. He underwent successful PTCA and stenting of the proximal LAD with implantation of a 3.0 x 12 mm Promus premier DES. The stent was overlapped with the previously placed mid LAD stent.  He denies any chest pain this morning.  Right radial access site asymptomatic. His wife is present at the bedside.  Recommendations on discharge: Continue DAPT with ASA and Effient. Pt has SL ntg and is aware of when and how to take it. Follow up outpatient in 7-10 days.  Will likely need to consider initiating PCSK9 inhibitor due to statin intolerance and h/o  rhabdomyalysis and significant CAD.   Discharge Exam: Blood pressure 134/77, pulse 60, temperature 98.6 F (37 C), temperature source Oral, resp. rate 18, height 5\' 8"  (1.727 m), weight 85 kg (187 lb 6.3 oz), SpO2 95 %.    General appearance: alert, cooperative, appears stated age and no distress Neck: no adenopathy, no carotid bruit, no JVD, supple, symmetrical, trachea midline and thyroid not enlarged, symmetric, no tenderness/mass/nodules Resp: clear to auscultation bilaterally Chest wall: no tenderness Cardio: regular rate and rhythm, S1, S2 normal, no murmur, click, rub or gallop Extremities: extremities normal, atraumatic, no cyanosis or edema Pulses: 2+ and symmetric  Labs:   Lab Results  Component Value Date   WBC 4.4 05/04/2014   HGB 11.2* 05/04/2014   HCT 34.9* 05/04/2014   MCV 82.5 05/04/2014   PLT 112* 05/04/2014    Recent Labs Lab 05/04/14 0451  NA 139  K 3.9  CL 107  CO2 25  BUN 13  CREATININE 1.06  CALCIUM 8.7  GLUCOSE 114*   Lab Results  Component Value Date   CKTOTAL 78 04/04/2014   TROPONINI 0.13* 04/09/2014    Lipid Panel     Component Value Date/Time   CHOL 172 04/04/2014 0831   CHOL 116 12/21/2012 0240   TRIG 161* 04/04/2014 0831   HDL 32* 04/04/2014 0831   HDL 33* 12/21/2012 0240   CHOLHDL 5.4* 04/04/2014 0831   CHOLHDL 3.5 12/21/2012 0240   VLDL 32 12/21/2012 0240   LDLCALC 108* 04/04/2014 0831   LDLCALC 51 12/21/2012 0240    EKG 05/04/2014: Sinus rhythm at a rate of 60 beats minute, normal  axis, normal intervals.  Poor R-wave progression.  No change from prior.  Radiology: No results found.    FOLLOW UP PLANS AND APPOINTMENTS Discharge Instructions    Discharge patient    Complete by:  As directed             Medication List    TAKE these medications        acetaminophen 500 MG tablet  Commonly known as:  TYLENOL  Take 1,000-1,500 mg by mouth every 6 (six) hours as needed for headache.     aspirin 81 MG EC tablet   Take 1 tablet (81 mg total) by mouth daily.     Carbidopa-Levodopa ER 25-100 MG tablet controlled release  Commonly known as:  SINEMET CR  Take 1 tablet by mouth daily.     carbidopa-levodopa 25-100 MG per tablet  Commonly known as:  SINEMET IR  One up to 3 time daily before meals to help prevent leg spasm     carvedilol 6.25 MG tablet  Commonly known as:  COREG  Take 0.5 tablets (3.125 mg total) by mouth 2 (two) times daily with a meal.     glyBURIDE 2.5 MG tablet  Commonly known as:  DIABETA  TAKE 1 TABLET (2.5 MG TOTAL) BY MOUTH DAILY WITH BREAKFAST.     insulin glargine 100 unit/mL Sopn  Commonly known as:  LANTUS  Inject 20-26 Units into the skin 2 (two) times daily. 22-24 units in the morning and 26 units in the evening.     nitroGLYCERIN 0.4 MG SL tablet  Commonly known as:  NITROSTAT  Place 0.4 mg under the tongue every 5 (five) minutes as needed for chest pain.     pantoprazole 40 MG tablet  Commonly known as:  PROTONIX  TAKE 1 TABLET (40 MG TOTAL) BY MOUTH 2 (TWO) TIMES DAILY.     pramipexole 0.75 MG tablet  Commonly known as:  MIRAPEX  Take 0.5 tablets twice daily     prasugrel 10 MG Tabs tablet  Commonly known as:  EFFIENT  Take 1 tablet (10 mg total) by mouth daily.           Follow-up Information    Follow up with Pamella Pert, MD On 05/12/2014.   Specialty:  Cardiology   Why:  at 1:45   Contact information:   [address] [place] [address] [phone]        Erling Conte, NP-C 05/04/2014, 8:22 AM [facility] Pager: [phone] Office: [phone]

## 2014-05-05 ENCOUNTER — Telehealth: Payer: Self-pay | Admitting: Neurology

## 2014-05-05 NOTE — Telephone Encounter (Signed)
Pt would like to speak to a nurse regarding the EMG he had done on 03/23/14,. C/b [phone]

## 2014-05-05 NOTE — Telephone Encounter (Signed)
Pt canceled his f/u for 05/31/14. Pt did not give a reason

## 2014-05-05 NOTE — Telephone Encounter (Signed)
I returned patient's call. He states that he has gotten a bill for his EMG/NCV done on 03/23/14 for $1320.00. He says the reason that his insurance didn't cover the test because we didn't get a referral from his pcp 1st. I told him I would look into it to see if there was anything we could do & I would give him a call back.

## 2014-05-10 ENCOUNTER — Telehealth: Payer: Self-pay

## 2014-05-10 DIAGNOSIS — R531 Weakness: Secondary | ICD-10-CM

## 2014-05-10 NOTE — Telephone Encounter (Signed)
Sue Lushndrea called to request order for referral per insurance company. Order placed

## 2014-05-10 NOTE — Telephone Encounter (Signed)
This will be reviewed further / Kenneth S.

## 2014-05-19 ENCOUNTER — Encounter (HOSPITAL_COMMUNITY): Payer: 59

## 2014-05-23 ENCOUNTER — Encounter (HOSPITAL_COMMUNITY): Payer: 59

## 2014-05-23 ENCOUNTER — Other Ambulatory Visit: Payer: Self-pay | Admitting: Internal Medicine

## 2014-05-23 MED ORDER — AMBULATORY NON FORMULARY MEDICATION: Status: DC

## 2014-05-23 NOTE — Telephone Encounter (Signed)
Patient requested to be faxed to pharmacy 

## 2014-05-24 ENCOUNTER — Encounter (HOSPITAL_COMMUNITY)
Admission: RE | Admit: 2014-05-24 | Discharge: 2014-05-24 | Disposition: A | Discharge status: Home / Self Care | Payer: Self-pay | Source: Ambulatory Visit | Attending: Cardiology | Admitting: Cardiology

## 2014-05-24 DIAGNOSIS — Z48812 Encounter for surgical aftercare following surgery on the circulatory system: Secondary | ICD-10-CM | POA: Insufficient documentation

## 2014-05-24 DIAGNOSIS — Z955 Presence of coronary angioplasty implant and graft: Secondary | ICD-10-CM | POA: Insufficient documentation

## 2014-05-25 ENCOUNTER — Encounter (HOSPITAL_COMMUNITY): Payer: 59

## 2014-05-25 ENCOUNTER — Encounter (HOSPITAL_COMMUNITY)
Admission: RE | Admit: 2014-05-25 | Discharge: 2014-05-25 | Disposition: A | Discharge status: Home / Self Care | Payer: Self-pay | Source: Ambulatory Visit | Attending: Cardiology | Admitting: Cardiology

## 2014-05-26 ENCOUNTER — Encounter (HOSPITAL_COMMUNITY)
Admission: RE | Admit: 2014-05-26 | Discharge: 2014-05-26 | Disposition: A | Discharge status: Home / Self Care | Payer: Self-pay | Source: Ambulatory Visit | Attending: Cardiology | Admitting: Cardiology

## 2014-05-27 ENCOUNTER — Encounter (HOSPITAL_COMMUNITY): Payer: 59

## 2014-05-30 ENCOUNTER — Encounter (HOSPITAL_COMMUNITY): Payer: 59

## 2014-05-31 ENCOUNTER — Ambulatory Visit: Payer: BC Managed Care – PPO | Admitting: Neurology

## 2014-05-31 ENCOUNTER — Encounter (HOSPITAL_COMMUNITY)
Admission: RE | Admit: 2014-05-31 | Discharge: 2014-05-31 | Disposition: A | Discharge status: Home / Self Care | Payer: Self-pay | Source: Ambulatory Visit | Attending: Cardiology | Admitting: Cardiology

## 2014-06-01 ENCOUNTER — Encounter (HOSPITAL_COMMUNITY): Payer: 59

## 2014-06-01 ENCOUNTER — Encounter (HOSPITAL_COMMUNITY)
Admission: RE | Admit: 2014-06-01 | Discharge: 2014-06-01 | Disposition: A | Discharge status: Home / Self Care | Payer: Self-pay | Source: Ambulatory Visit | Attending: Cardiology | Admitting: Cardiology

## 2014-06-02 ENCOUNTER — Encounter (HOSPITAL_COMMUNITY)
Admission: RE | Admit: 2014-06-02 | Discharge: 2014-06-02 | Disposition: A | Discharge status: Home / Self Care | Payer: Self-pay | Source: Ambulatory Visit | Attending: Cardiology | Admitting: Cardiology

## 2014-06-03 ENCOUNTER — Encounter (HOSPITAL_COMMUNITY): Payer: 59

## 2014-06-06 ENCOUNTER — Encounter (HOSPITAL_COMMUNITY): Payer: 59

## 2014-06-07 ENCOUNTER — Encounter (HOSPITAL_COMMUNITY)
Admission: RE | Admit: 2014-06-07 | Discharge: 2014-06-07 | Disposition: A | Discharge status: Home / Self Care | Payer: 59 | Source: Ambulatory Visit | Attending: Cardiology | Admitting: Cardiology

## 2014-06-07 DIAGNOSIS — Z955 Presence of coronary angioplasty implant and graft: Secondary | ICD-10-CM | POA: Insufficient documentation

## 2014-06-07 DIAGNOSIS — Z48812 Encounter for surgical aftercare following surgery on the circulatory system: Secondary | ICD-10-CM | POA: Insufficient documentation

## 2014-06-08 ENCOUNTER — Encounter (HOSPITAL_COMMUNITY)
Admission: RE | Admit: 2014-06-08 | Discharge: 2014-06-08 | Disposition: A | Discharge status: Home / Self Care | Payer: 59 | Source: Ambulatory Visit | Attending: Cardiology | Admitting: Cardiology

## 2014-06-08 ENCOUNTER — Encounter (HOSPITAL_COMMUNITY): Payer: 59

## 2014-06-09 ENCOUNTER — Encounter (HOSPITAL_COMMUNITY)
Admission: RE | Admit: 2014-06-09 | Discharge: 2014-06-09 | Disposition: A | Discharge status: Home / Self Care | Payer: 59 | Source: Ambulatory Visit | Attending: Cardiology | Admitting: Cardiology

## 2014-06-10 ENCOUNTER — Encounter (HOSPITAL_COMMUNITY): Payer: 59

## 2014-06-13 ENCOUNTER — Encounter (HOSPITAL_COMMUNITY): Payer: 59

## 2014-06-14 ENCOUNTER — Encounter (HOSPITAL_COMMUNITY)
Admission: RE | Admit: 2014-06-14 | Discharge: 2014-06-14 | Disposition: A | Discharge status: Home / Self Care | Payer: 59 | Source: Ambulatory Visit | Attending: Cardiology | Admitting: Cardiology

## 2014-06-14 ENCOUNTER — Other Ambulatory Visit: Payer: Self-pay | Admitting: Internal Medicine

## 2014-06-15 ENCOUNTER — Encounter (HOSPITAL_COMMUNITY): Payer: 59

## 2014-06-15 ENCOUNTER — Encounter (HOSPITAL_COMMUNITY)
Admission: RE | Admit: 2014-06-15 | Discharge: 2014-06-15 | Disposition: A | Discharge status: Home / Self Care | Payer: 59 | Source: Ambulatory Visit | Attending: Cardiology | Admitting: Cardiology

## 2014-06-16 ENCOUNTER — Encounter (HOSPITAL_COMMUNITY)
Admission: RE | Admit: 2014-06-16 | Discharge: 2014-06-16 | Disposition: A | Discharge status: Home / Self Care | Payer: 59 | Source: Ambulatory Visit | Attending: Cardiology | Admitting: Cardiology

## 2014-06-17 ENCOUNTER — Encounter (HOSPITAL_COMMUNITY): Payer: 59

## 2014-06-20 ENCOUNTER — Encounter (HOSPITAL_COMMUNITY): Payer: 59

## 2014-06-21 ENCOUNTER — Encounter (HOSPITAL_COMMUNITY): Payer: 59

## 2014-06-22 ENCOUNTER — Encounter (HOSPITAL_COMMUNITY): Payer: 59

## 2014-06-22 ENCOUNTER — Encounter (HOSPITAL_COMMUNITY)
Admission: RE | Admit: 2014-06-22 | Discharge: 2014-06-22 | Disposition: A | Discharge status: Home / Self Care | Payer: 59 | Source: Ambulatory Visit | Attending: Cardiology | Admitting: Cardiology

## 2014-06-23 ENCOUNTER — Encounter (HOSPITAL_COMMUNITY): Payer: 59

## 2014-06-23 LAB — LIPID PANEL
Cholesterol: 89 mg/dL (ref 0–200)
HDL: 38 mg/dL (ref 35–70)
LDL Cholesterol: 26 mg/dL
Triglycerides: 127 mg/dL (ref 40–160)

## 2014-06-24 ENCOUNTER — Encounter (HOSPITAL_COMMUNITY): Payer: 59

## 2014-06-27 ENCOUNTER — Encounter (HOSPITAL_COMMUNITY): Payer: 59

## 2014-06-28 ENCOUNTER — Encounter (HOSPITAL_COMMUNITY)
Admission: RE | Admit: 2014-06-28 | Discharge: 2014-06-28 | Disposition: A | Discharge status: Home / Self Care | Payer: 59 | Source: Ambulatory Visit | Attending: Cardiology | Admitting: Cardiology

## 2014-06-29 ENCOUNTER — Encounter (HOSPITAL_COMMUNITY)
Admission: RE | Admit: 2014-06-29 | Discharge: 2014-06-29 | Disposition: A | Discharge status: Home / Self Care | Payer: 59 | Source: Ambulatory Visit | Attending: Cardiology | Admitting: Cardiology

## 2014-06-29 ENCOUNTER — Encounter (HOSPITAL_COMMUNITY): Payer: 59

## 2014-06-30 ENCOUNTER — Encounter (HOSPITAL_COMMUNITY)
Admission: RE | Admit: 2014-06-30 | Discharge: 2014-06-30 | Disposition: A | Discharge status: Home / Self Care | Payer: 59 | Source: Ambulatory Visit | Attending: Cardiology | Admitting: Cardiology

## 2014-07-01 ENCOUNTER — Encounter (HOSPITAL_COMMUNITY): Payer: 59

## 2014-07-03 ENCOUNTER — Other Ambulatory Visit: Payer: Self-pay | Admitting: Internal Medicine

## 2014-07-04 ENCOUNTER — Encounter (HOSPITAL_COMMUNITY): Payer: 59

## 2014-07-04 ENCOUNTER — Other Ambulatory Visit: Payer: 59

## 2014-07-04 DIAGNOSIS — E1165 Type 2 diabetes mellitus with hyperglycemia: Secondary | ICD-10-CM

## 2014-07-04 DIAGNOSIS — IMO0002 Reserved for concepts with insufficient information to code with codable children: Secondary | ICD-10-CM

## 2014-07-04 DIAGNOSIS — E1151 Type 2 diabetes mellitus with diabetic peripheral angiopathy without gangrene: Secondary | ICD-10-CM

## 2014-07-04 DIAGNOSIS — D509 Iron deficiency anemia, unspecified: Secondary | ICD-10-CM

## 2014-07-04 DIAGNOSIS — I10 Essential (primary) hypertension: Secondary | ICD-10-CM

## 2014-07-04 DIAGNOSIS — E785 Hyperlipidemia, unspecified: Secondary | ICD-10-CM

## 2014-07-05 ENCOUNTER — Encounter (HOSPITAL_COMMUNITY)
Admission: RE | Admit: 2014-07-05 | Discharge: 2014-07-05 | Disposition: A | Discharge status: Home / Self Care | Payer: Self-pay | Source: Ambulatory Visit | Attending: Cardiology | Admitting: Cardiology

## 2014-07-05 DIAGNOSIS — Z48812 Encounter for surgical aftercare following surgery on the circulatory system: Secondary | ICD-10-CM | POA: Insufficient documentation

## 2014-07-05 DIAGNOSIS — Z955 Presence of coronary angioplasty implant and graft: Secondary | ICD-10-CM | POA: Insufficient documentation

## 2014-07-05 LAB — CBC WITH DIFFERENTIAL
BASOS ABS: 0 10*3/uL (ref 0.0–0.2)
Basos: 0 %
EOS (ABSOLUTE): 0.1 10*3/uL (ref 0.0–0.4)
Eos: 1 %
Hematocrit: 35.2 % — ABNORMAL LOW (ref 37.5–51.0)
Hemoglobin: 11.1 g/dL — ABNORMAL LOW (ref 12.6–17.7)
Immature Grans (Abs): 0 10*3/uL (ref 0.0–0.1)
Immature Granulocytes: 0 %
LYMPHS ABS: 1.2 10*3/uL (ref 0.7–3.1)
Lymphs: 27 %
MCH: 24.2 pg — AB (ref 26.6–33.0)
MCHC: 31.5 g/dL (ref 31.5–35.7)
MCV: 77 fL — ABNORMAL LOW (ref 79–97)
MONOCYTES: 8 %
MONOS ABS: 0.4 10*3/uL (ref 0.1–0.9)
NEUTROS PCT: 64 %
Neutrophils Absolute: 2.9 10*3/uL (ref 1.4–7.0)
RBC: 4.59 x10E6/uL (ref 4.14–5.80)
RDW: 14.7 % (ref 12.3–15.4)
WBC: 4.5 10*3/uL (ref 3.4–10.8)

## 2014-07-05 LAB — LIPID PANEL
CHOLESTEROL TOTAL: 91 mg/dL — AB (ref 100–199)
Chol/HDL Ratio: 2.2 ratio units (ref 0.0–5.0)
HDL: 41 mg/dL (ref >39)
LDL CALC: 30 mg/dL (ref 0–99)
Triglycerides: 100 mg/dL (ref 0–149)
VLDL Cholesterol Cal: 20 mg/dL (ref 5–40)

## 2014-07-05 LAB — HEMOGLOBIN A1C
ESTIMATED AVERAGE GLUCOSE: 146 mg/dL
HEMOGLOBIN A1C: 6.7 % — AB (ref 4.8–5.6)

## 2014-07-05 LAB — BASIC METABOLIC PANEL
BUN / CREAT RATIO: 19 (ref 10–22)
BUN: 22 mg/dL (ref 8–27)
CO2: 24 mmol/L (ref 18–29)
CREATININE: 1.15 mg/dL (ref 0.76–1.27)
Calcium: 8.9 mg/dL (ref 8.6–10.2)
Chloride: 104 mmol/L (ref 97–108)
GFR calc Af Amer: 77 mL/min/{1.73_m2} (ref >59)
GFR calc non Af Amer: 67 mL/min/{1.73_m2} (ref >59)
Glucose: 102 mg/dL — ABNORMAL HIGH (ref 65–99)
POTASSIUM: 4.5 mmol/L (ref 3.5–5.2)
SODIUM: 142 mmol/L (ref 134–144)

## 2014-07-05 LAB — MICROALBUMIN, URINE: MICROALBUM., U, RANDOM: 6 ug/mL (ref 0.0–17.0)

## 2014-07-05 LAB — IRON: Iron: 32 ug/dL — ABNORMAL LOW (ref 38–169)

## 2014-07-06 ENCOUNTER — Ambulatory Visit (INDEPENDENT_AMBULATORY_CARE_PROVIDER_SITE_OTHER): Payer: 59 | Admitting: Internal Medicine

## 2014-07-06 ENCOUNTER — Encounter: Payer: Self-pay | Admitting: Internal Medicine

## 2014-07-06 ENCOUNTER — Encounter (HOSPITAL_COMMUNITY)
Admission: RE | Admit: 2014-07-06 | Discharge: 2014-07-06 | Disposition: A | Discharge status: Home / Self Care | Payer: Self-pay | Source: Ambulatory Visit | Attending: Cardiology | Admitting: Cardiology

## 2014-07-06 ENCOUNTER — Encounter (HOSPITAL_COMMUNITY): Payer: 59

## 2014-07-06 VITALS — BP 124/82 | HR 65 | Temp 98.5°F | Ht 68.0 in | Wt 181.4 lb

## 2014-07-06 DIAGNOSIS — I25118 Atherosclerotic heart disease of native coronary artery with other forms of angina pectoris: Secondary | ICD-10-CM | POA: Diagnosis not present

## 2014-07-06 DIAGNOSIS — G2581 Restless legs syndrome: Secondary | ICD-10-CM

## 2014-07-06 DIAGNOSIS — E1151 Type 2 diabetes mellitus with diabetic peripheral angiopathy without gangrene: Secondary | ICD-10-CM | POA: Diagnosis not present

## 2014-07-06 DIAGNOSIS — D509 Iron deficiency anemia, unspecified: Secondary | ICD-10-CM | POA: Diagnosis not present

## 2014-07-06 DIAGNOSIS — I10 Essential (primary) hypertension: Secondary | ICD-10-CM

## 2014-07-06 DIAGNOSIS — E785 Hyperlipidemia, unspecified: Secondary | ICD-10-CM | POA: Diagnosis not present

## 2014-07-06 DIAGNOSIS — R748 Abnormal levels of other serum enzymes: Secondary | ICD-10-CM

## 2014-07-06 DIAGNOSIS — I798 Other disorders of arteries, arterioles and capillaries in diseases classified elsewhere: Secondary | ICD-10-CM

## 2014-07-06 MED ORDER — FERROUS SULFATE 325 (65 FE) MG PO TABS: ORAL_TABLET | ORAL | Status: DC

## 2014-07-06 MED ORDER — PRAMIPEXOLE DIHYDROCHLORIDE 1 MG PO TABS: ORAL_TABLET | ORAL | Status: DC

## 2014-07-06 NOTE — Progress Notes (Signed)
Patient ID: [name], male   DOB: 1950/08/10, 64 y.o.   MRN: [id number]    Facility  [facility]    Place of Service:   OFFICE    Allergies  Allergen Reactions  . Statins Other (See Comments)    Causes Restless Legs, rhabdomyalysis    Chief Complaint  Patient presents with  . Medical Management of Chronic Issues    3 Month Follow up. 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 He tried increasing Mirapex on its own and it did seem to help some.  Iron deficiency anemia: Her problem. Previously blamed on gastritis and duodenitis. He continues to take pantoprazole regularly. He doesn't have any symptoms of gastric burning, epigastric discomfort, or blood in the stool. He did have some nausea that he previously blamed on Effient.  Elevated CPK: Needs follow-up because of the increasing issues of the restless leg syndrome.    Medications: Patient's Medications  New Prescriptions   No medications on file  Previous Medications   ACETAMINOPHEN (TYLENOL) 500 MG TABLET    Take 1,000-1,500 mg by mouth every 6 (six) hours as needed for headache.   ALIROCUMAB 75 MG/ML SOPN    Inject 1 each into the skin. Every 2 weeks for cholesterol   AMBULATORY NON FORMULARY MEDICATION    One Touch Ultra Test Strips Sig: Use to test blood sugar four times daily. Dx: E11.51, I10   ASPIRIN 81 MG EC TABLET    Take 1 tablet (81 mg total) by mouth daily.   CARBIDOPA-LEVODOPA (SINEMET IR) 25-100 MG PER TABLET    One up to 3 time daily before meals to help prevent leg spasm   CARBIDOPA-LEVODOPA ER (SINEMET CR) 25-100 MG TABLET CONTROLLED  RELEASE    Take 1 tablet by mouth daily.    CARVEDILOL (COREG) 6.25 MG TABLET    Take 0.5 tablets (3.125 mg total) by mouth 2 (two) times daily with a meal.   GLYBURIDE (DIABETA) 2.5 MG TABLET    TAKE 1 TABLET (2.5 MG TOTAL) BY MOUTH DAILY WITH BREAKFAST.   INSULIN GLARGINE (LANTUS) 100 UNIT/ML SOPN    Inject 20-26 Units into the skin 2 (two) times daily. 22-24 units in the morning and 26 units in the evening.   LANTUS SOLOSTAR 100 UNIT/ML SOLOSTAR PEN    INJECT 20 UNITS IN THE MORNING AND 24 UNITS IN THE EVENING TO CONTROL DIABETES   NITROGLYCERIN (NITROSTAT) 0.4 MG SL TABLET    Place 0.4 mg under the tongue every 5 (five) minutes as needed for chest pain.   PANTOPRAZOLE (PROTONIX) 40 MG TABLET    TAKE 1 TABLET (40 MG TOTAL) BY MOUTH 2 (TWO) TIMES DAILY.   PRAMIPEXOLE (MIRAPEX) 0.75 MG TABLET    Take 0.5 tablets twice daily   PRASUGREL (EFFIENT) 10 MG TABS TABLET    Take 1 tablet (10 mg total) by mouth daily.  Modified Medications   No medications on file  Discontinued Medications   No medications on file     Review of Systems  Constitutional: Negative for fever and fatigue.  HENT: Positive for hearing loss. Negative for congestion and ear pain.  Eyes: Negative.        Wears prescription lenses.  Respiratory: Negative for cough, choking, chest tightness, shortness of breath (with exertion) and wheezing.   Cardiovascular: Negative for chest pain, palpitations and leg swelling.  Gastrointestinal: Negative for nausea, vomiting, abdominal pain, diarrhea, constipation and abdominal distention.       Barrett's esophagus and reflux history  Endocrine: Negative for cold intolerance, heat intolerance, polydipsia, polyphagia and polyuria.       Diabetes under poor control.  Genitourinary: Negative.   Musculoskeletal: Positive for back pain, joint swelling, arthralgias and gait problem. Negative for myalgias, neck pain and neck stiffness.  Skin: Negative.   Allergic/Immunologic: Negative.     Neurological: Positive for weakness, numbness (to bilateral feet, with  restless legs) and headaches. Negative for dizziness, tremors, seizures, syncope, facial asymmetry, speech difficulty and light-headedness.       Severe restless legs  Hematological: Bruises/bleeds easily.       Iron deficiency anemia without obvious blood loss.  Psychiatric/Behavioral: The patient is nervous/anxious.     Filed Vitals:   07/06/14 1156  BP: 124/82  Pulse: 65  Temp: 98.5 F (36.9 C)  TempSrc: Oral  Height: 5' 8"  (1.727 m)  Weight: 181 lb 6.4 oz (82.283 kg)   Body mass index is 27.59 kg/(m^2).  Physical Exam  Constitutional: He is oriented to person, place, and time. He appears well-developed and well-nourished. No distress.  overweight  HENT:  Head: Normocephalic and atraumatic.  Right Ear: External ear normal.  Left Ear: External ear normal.  Nose: Nose normal.  Mouth/Throat: Oropharynx is clear and moist.  Eyes: Conjunctivae and EOM are normal. Pupils are equal, round, and reactive to light.  Neck: Normal range of motion. Neck supple. No JVD present. No tracheal deviation present. No thyromegaly present.  Cardiovascular: Normal rate, regular rhythm, normal heart sounds and intact distal pulses.  Exam reveals no gallop and no friction rub.   No murmur heard. Pulmonary/Chest: Effort normal. No respiratory distress. He has no wheezes. He has no rales. He exhibits no tenderness.  Abdominal: Soft. Bowel sounds are normal. He exhibits no distension and no mass.  Musculoskeletal: He exhibits no edema or tenderness.  Bilateral shoulder discomfort with some restriction of movement especially at the left shoulder. Painful to raise left arm. Painful to lift items and to rotate at left shoulder. Has been seen by Dr. Noemi Chapel. Chronic neck pains. Tender at insertion of the left Achilles tendon.  Lymphadenopathy:    He has no cervical adenopathy.  Neurological: He is alert and oriented to person, place,  and time. He has normal reflexes. No cranial nerve deficit. Coordination normal.  Bilateral restless legs.  Skin: Skin is warm and dry. No rash noted. No erythema. No pallor.  Psychiatric: He has a normal mood and affect. His behavior is normal. Judgment and thought content normal.     Labs reviewed: Office Visit on 07/06/2014  Component Date Value Ref Range Status  . Triglycerides 06/23/2014 127  40 - 160 mg/dL Final  . Cholesterol 06/23/2014 89  0 - 200 mg/dL Final  . HDL 06/23/2014 38  35 - 70 mg/dL Final  . LDL Cholesterol 06/23/2014 26   Final  Lab on 07/04/2014  Component Date Value Ref Range Status  . Hgb A1c MFr Bld 07/04/2014 6.7* 4.8 - 5.6 % Final   Comment:          Pre-diabetes: 5.7 - 6.4          Diabetes: >  6.4          Glycemic control for adults with diabetes: <7.0   . Est. average glucose Bld gHb Est-m* 07/04/2014 146   Final  . Glucose 07/04/2014 102* 65 - 99 mg/dL Final  . BUN 07/04/2014 22  8 - 27 mg/dL Final  . Creatinine, Ser 07/04/2014 1.15  0.76 - 1.27 mg/dL Final  . GFR calc non Af Amer 07/04/2014 67  >59 mL/min/1.73 Final  . GFR calc Af Amer 07/04/2014 77  >59 mL/min/1.73 Final  . BUN/Creatinine Ratio 07/04/2014 19  10 - 22 Final  . Sodium 07/04/2014 142  134 - 144 mmol/L Final  . Potassium 07/04/2014 4.5  3.5 - 5.2 mmol/L Final  . Chloride 07/04/2014 104  97 - 108 mmol/L Final  . CO2 07/04/2014 24  18 - 29 mmol/L Final  . Calcium 07/04/2014 8.9  8.6 - 10.2 mg/dL Final  . Microalbum.,U,Random 07/04/2014 6.0  0.0 - 17.0 ug/mL Final   Comment: **Effective Jul 11, 2014 the reference interval for**   Microalbumin, Urine will be changing to:                                         Not Estab.   . Cholesterol, Total 07/04/2014 91* 100 - 199 mg/dL Final  . Triglycerides 07/04/2014 100  0 - 149 mg/dL Final  . HDL 07/04/2014 41  >39 mg/dL Final   Comment: According to ATP-III Guidelines, HDL-C >59 mg/dL is considered a negative risk factor for CHD.   Marland Kitchen VLDL  Cholesterol Cal 07/04/2014 20  5 - 40 mg/dL Final  . LDL Calculated 07/04/2014 30  0 - 99 mg/dL Final  . Chol/HDL Ratio 07/04/2014 2.2  0.0 - 5.0 ratio units Final   Comment:                                   T. Chol/HDL Ratio                                             Men  Women                               1/2 Avg.Risk  3.4    3.3                                   Avg.Risk  5.0    4.4                                2X Avg.Risk  9.6    7.1                                3X Avg.Risk 23.4   11.0   . WBC 07/04/2014 4.5  3.4 - 10.8 x10E3/uL Final  . RBC 07/04/2014 4.59  4.14 - 5.80 x10E6/uL Final  . Hemoglobin 07/04/2014 11.1* 12.6 - 17.7 g/dL Final  . Hematocrit 07/04/2014 35.2* 37.5 - 51.0 %  Final  . MCV 07/04/2014 77* 79 - 97 fL Final  . MCH 07/04/2014 24.2* 26.6 - 33.0 pg Final  . MCHC 07/04/2014 31.5  31.5 - 35.7 g/dL Final  . RDW 07/04/2014 14.7  12.3 - 15.4 % Final  . NEUTROPHILS 07/04/2014 64   Final  . Lymphs 07/04/2014 27   Final  . Monocytes 07/04/2014 8   Final  . Eos 07/04/2014 1   Final  . Basos 07/04/2014 0   Final  . Neutrophils Absolute 07/04/2014 2.9  1.4 - 7.0 x10E3/uL Final  . Lymphocytes Absolute 07/04/2014 1.2  0.7 - 3.1 x10E3/uL Final  . Monocytes Absolute 07/04/2014 0.4  0.1 - 0.9 x10E3/uL Final  . EOS (ABSOLUTE) 07/04/2014 0.1  0.0 - 0.4 x10E3/uL Final  . Basophils Absolute 07/04/2014 0.0  0.0 - 0.2 x10E3/uL Final  . Immature Granulocytes 07/04/2014 0   Final  . Immature Grans (Abs) 07/04/2014 0.0  0.0 - 0.1 x10E3/uL Final  Orders Only on 07/03/2014  Component Date Value Ref Range Status  . Iron 07/03/2014 32* 38 - 169 ug/dL Final  Admission on 05/03/2014, Discharged on 05/04/2014  Component Date Value Ref Range Status  . Glucose-Capillary 05/03/2014 160* 70 - 99 mg/dL Final  . Comment 1 05/03/2014 Notify RN   Final  . Prothrombin Time 05/03/2014 14.7  11.6 - 15.2 seconds Final  . INR 05/03/2014 1.14  0.00 - 1.49 Final  . Glucose-Capillary 05/03/2014 65*  70 - 99 mg/dL Final  . MRSA by PCR 05/03/2014 NEGATIVE  NEGATIVE Final   Comment:        The GeneXpert MRSA Assay (FDA approved for NASAL specimens only), is one component of a comprehensive MRSA colonization surveillance program. It is not intended to diagnose MRSA infection nor to guide or monitor treatment for MRSA infections.   . Sodium 05/04/2014 139  135 - 145 mmol/L Final  . Potassium 05/04/2014 3.9  3.5 - 5.1 mmol/L Final  . Chloride 05/04/2014 107  96 - 112 mmol/L Final  . CO2 05/04/2014 25  19 - 32 mmol/L Final  . Glucose, Bld 05/04/2014 114* 70 - 99 mg/dL Final  . BUN 05/04/2014 13  6 - 23 mg/dL Final  . Creatinine, Ser 05/04/2014 1.06  0.50 - 1.35 mg/dL Final  . Calcium 05/04/2014 8.7  8.4 - 10.5 mg/dL Final  . GFR calc non Af Amer 05/04/2014 72* >90 mL/min Final  . GFR calc Af Amer 05/04/2014 84* >90 mL/min Final   Comment: (NOTE) The eGFR has been calculated using the CKD EPI equation. This calculation has not been validated in all clinical situations. eGFR's persistently <90 mL/min signify possible Chronic Kidney Disease.   . Anion gap 05/04/2014 7  5 - 15 Final  . WBC 05/04/2014 4.4  4.0 - 10.5 K/uL Final  . RBC 05/04/2014 4.23  4.22 - 5.81 MIL/uL Final  . Hemoglobin 05/04/2014 11.2* 13.0 - 17.0 g/dL Final  . HCT 05/04/2014 34.9* 39.0 - 52.0 % Final  . MCV 05/04/2014 82.5  78.0 - 100.0 fL Final  . MCH 05/04/2014 26.5  26.0 - 34.0 pg Final  . MCHC 05/04/2014 32.1  30.0 - 36.0 g/dL Final  . RDW 05/04/2014 13.2  11.5 - 15.5 % Final  . Platelets 05/04/2014 112* 150 - 400 K/uL Final   Comment: REPEATED TO VERIFY PLATELET COUNT CONFIRMED BY SMEAR CONSISTENT WITH PREVIOUS RESULT   . Glucose-Capillary 05/03/2014 215* 70 - 99 mg/dL Final  . Activated Clotting Time 05/03/2014 497  Final  . Glucose-Capillary 05/03/2014 84  70 - 99 mg/dL Final  . Comment 1 05/03/2014 Notify RN   Final  . Comment 2 05/03/2014 Documented in [place]   Final  . Glucose-Capillary  05/04/2014 107* 70 - 99 mg/dL Final  . Comment 1 05/04/2014 Notify RN   Final  . Comment 2 05/04/2014 Documented in [place]   Final  Admission on 04/08/2014, Discharged on 04/09/2014  Component Date Value Ref Range Status  . Troponin I 04/08/2014 <0.03  <0.031 ng/mL Final   Comment:        NO INDICATION OF MYOCARDIAL INJURY.   . Troponin I 04/09/2014 0.13* <0.031 ng/mL Final   Comment:        PERSISTENTLY INCREASED TROPONIN VALUES IN THE RANGE OF 0.04-0.49 ng/mL CAN BE SEEN IN:       -UNSTABLE ANGINA       -CONGESTIVE HEART FAILURE       -MYOCARDITIS       -CHEST TRAUMA       -ARRYHTHMIAS       -LATE PRESENTING MYOCARDIAL INFARCTION       -COPD   CLINICAL FOLLOW-UP RECOMMENDED.   Marland Kitchen Sodium 04/09/2014 137  135 - 145 mmol/L Final  . Potassium 04/09/2014 4.0  3.5 - 5.1 mmol/L Final  . Chloride 04/09/2014 108  96 - 112 mmol/L Final  . CO2 04/09/2014 26  19 - 32 mmol/L Final  . Glucose, Bld 04/09/2014 114* 70 - 99 mg/dL Final  . BUN 04/09/2014 15  6 - 23 mg/dL Final  . Creatinine, Ser 04/09/2014 1.11  0.50 - 1.35 mg/dL Final  . Calcium 04/09/2014 8.2* 8.4 - 10.5 mg/dL Final  . GFR calc non Af Amer 04/09/2014 68* >90 mL/min Final  . GFR calc Af Amer 04/09/2014 79* >90 mL/min Final   Comment: (NOTE) The eGFR has been calculated using the CKD EPI equation. This calculation has not been validated in all clinical situations. eGFR's persistently <90 mL/min signify possible Chronic Kidney Disease.   . Anion gap 04/09/2014 3* 5 - 15 Final  . WBC 04/09/2014 4.4  4.0 - 10.5 K/uL Final  . RBC 04/09/2014 4.20* 4.22 - 5.81 MIL/uL Final  . Hemoglobin 04/09/2014 11.4* 13.0 - 17.0 g/dL Final  . HCT 04/09/2014 34.2* 39.0 - 52.0 % Final  . MCV 04/09/2014 81.4  78.0 - 100.0 fL Final  . MCH 04/09/2014 27.1  26.0 - 34.0 pg Final  . MCHC 04/09/2014 33.3  30.0 - 36.0 g/dL Final  . RDW 04/09/2014 12.8  11.5 - 15.5 % Final  . Platelets 04/09/2014 113* 150 - 400 K/uL Final   CONSISTENT WITH PREVIOUS  RESULT  . MRSA by PCR 04/08/2014 NEGATIVE  NEGATIVE Final   Comment:        The GeneXpert MRSA Assay (FDA approved for NASAL specimens only), is one component of a comprehensive MRSA colonization surveillance program. It is not intended to diagnose MRSA infection nor to guide or monitor treatment for MRSA infections.   . Glucose-Capillary 04/08/2014 81  70 - 99 mg/dL Final  . Comment 1 04/08/2014 Capillary Sample   Final  . Glucose-Capillary 04/09/2014 116* 70 - 99 mg/dL Final  . Comment 1 04/09/2014 Capillary Sample   Final  . Glucose-Capillary 04/09/2014 96  70 - 99 mg/dL Final  . Comment 1 04/09/2014 Capillary Sample   Final  . Glucose-Capillary 04/09/2014 70  70 - 99 mg/dL Final  . Comment 1 04/09/2014 Capillary Sample   Final  .  Activated Clotting Time 04/08/2014 269   Final  . Activated Clotting Time 04/08/2014 276   Final     Assessment/Plan 1. Essential hypertension Controlled  2. Hyperlipidemia Excellent control by using prasugrel  3. 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 Coronary artery disease involving native coronary artery of native heart with other form of angina pectoris Stable  5. Restless legs syndrome (RLS) - CK - pramipexole (MIRAPEX) 1 MG tablet; One twice daily to help restless legs  Dispense: 60 tablet; Refill: 5  6. Iron deficiency anemia - ferrous sulfate 325 (65 FE) MG tablet; One daily to treat anemia  Dispense: 90 tablet; Refill: 3 - IBC panel - CBC With Differential; Future - Reticulocytes; Future  7. Elevated CPK -CPK, future

## 2014-07-06 NOTE — Patient Instructions (Signed)
Use up Mirapex 0.5 mg tablets by taking 2 tablets each dose.   Call if legs are not feeling better in a week.

## 2014-07-07 ENCOUNTER — Encounter (HOSPITAL_COMMUNITY)
Admission: RE | Admit: 2014-07-07 | Discharge: 2014-07-07 | Disposition: A | Discharge status: Home / Self Care | Payer: Self-pay | Source: Ambulatory Visit | Attending: Cardiology | Admitting: Cardiology

## 2014-07-08 ENCOUNTER — Encounter (HOSPITAL_COMMUNITY): Payer: 59

## 2014-07-11 ENCOUNTER — Encounter (HOSPITAL_COMMUNITY): Payer: 59

## 2014-07-12 ENCOUNTER — Encounter (HOSPITAL_COMMUNITY)
Admission: RE | Admit: 2014-07-12 | Discharge: 2014-07-12 | Disposition: A | Discharge status: Home / Self Care | Payer: Self-pay | Source: Ambulatory Visit | Attending: Cardiology | Admitting: Cardiology

## 2014-07-13 ENCOUNTER — Encounter (HOSPITAL_COMMUNITY): Payer: 59

## 2014-07-13 ENCOUNTER — Encounter (HOSPITAL_COMMUNITY): Payer: Self-pay

## 2014-07-14 ENCOUNTER — Encounter (HOSPITAL_COMMUNITY)
Admission: RE | Admit: 2014-07-14 | Discharge: 2014-07-14 | Disposition: A | Discharge status: Home / Self Care | Payer: Self-pay | Source: Ambulatory Visit | Attending: Cardiology | Admitting: Cardiology

## 2014-07-15 ENCOUNTER — Encounter (HOSPITAL_COMMUNITY): Payer: 59

## 2014-07-18 ENCOUNTER — Encounter (HOSPITAL_COMMUNITY): Payer: 59

## 2014-07-19 ENCOUNTER — Encounter (HOSPITAL_COMMUNITY)
Admission: RE | Admit: 2014-07-19 | Discharge: 2014-07-19 | Disposition: A | Discharge status: Home / Self Care | Payer: Self-pay | Source: Ambulatory Visit | Attending: Cardiology | Admitting: Cardiology

## 2014-07-20 ENCOUNTER — Encounter (HOSPITAL_COMMUNITY): Payer: 59

## 2014-07-20 ENCOUNTER — Encounter (HOSPITAL_COMMUNITY)
Admission: RE | Admit: 2014-07-20 | Discharge: 2014-07-20 | Disposition: A | Discharge status: Home / Self Care | Payer: Self-pay | Source: Ambulatory Visit | Attending: Cardiology | Admitting: Cardiology

## 2014-07-21 ENCOUNTER — Encounter (HOSPITAL_COMMUNITY)
Admission: RE | Admit: 2014-07-21 | Discharge: 2014-07-21 | Disposition: A | Discharge status: Home / Self Care | Payer: Self-pay | Source: Ambulatory Visit | Attending: Cardiology | Admitting: Cardiology

## 2014-07-22 ENCOUNTER — Encounter (HOSPITAL_COMMUNITY): Payer: 59

## 2014-07-25 ENCOUNTER — Encounter (HOSPITAL_COMMUNITY): Payer: 59

## 2014-07-26 ENCOUNTER — Encounter (HOSPITAL_COMMUNITY)
Admission: RE | Admit: 2014-07-26 | Discharge: 2014-07-26 | Disposition: A | Discharge status: Home / Self Care | Payer: Self-pay | Source: Ambulatory Visit | Attending: Cardiology | Admitting: Cardiology

## 2014-07-26 ENCOUNTER — Encounter (HOSPITAL_COMMUNITY): Payer: Self-pay

## 2014-07-27 ENCOUNTER — Encounter (HOSPITAL_COMMUNITY): Payer: Self-pay

## 2014-07-27 ENCOUNTER — Encounter (HOSPITAL_COMMUNITY): Payer: 59

## 2014-07-27 ENCOUNTER — Encounter (HOSPITAL_COMMUNITY)
Admission: RE | Admit: 2014-07-27 | Discharge: 2014-07-27 | Disposition: A | Discharge status: Home / Self Care | Payer: Self-pay | Source: Ambulatory Visit | Attending: Cardiology | Admitting: Cardiology

## 2014-07-28 ENCOUNTER — Encounter (HOSPITAL_COMMUNITY): Payer: Self-pay

## 2014-07-29 ENCOUNTER — Encounter (HOSPITAL_COMMUNITY): Payer: 59

## 2014-08-01 ENCOUNTER — Other Ambulatory Visit: Payer: Self-pay | Admitting: Internal Medicine

## 2014-08-02 ENCOUNTER — Encounter (HOSPITAL_COMMUNITY): Payer: Self-pay

## 2014-08-03 ENCOUNTER — Encounter (HOSPITAL_COMMUNITY): Payer: Self-pay

## 2014-08-03 ENCOUNTER — Encounter (HOSPITAL_COMMUNITY): Payer: 59

## 2014-08-04 ENCOUNTER — Encounter (HOSPITAL_COMMUNITY): Payer: Self-pay

## 2014-08-04 ENCOUNTER — Encounter (HOSPITAL_COMMUNITY)
Admission: RE | Admit: 2014-08-04 | Discharge: 2014-08-04 | Disposition: A | Discharge status: Home / Self Care | Payer: Self-pay | Source: Ambulatory Visit | Attending: Cardiology | Admitting: Cardiology

## 2014-08-04 DIAGNOSIS — Z48812 Encounter for surgical aftercare following surgery on the circulatory system: Secondary | ICD-10-CM | POA: Insufficient documentation

## 2014-08-04 DIAGNOSIS — Z955 Presence of coronary angioplasty implant and graft: Secondary | ICD-10-CM | POA: Insufficient documentation

## 2014-08-05 ENCOUNTER — Encounter (HOSPITAL_COMMUNITY): Payer: 59

## 2014-08-07 ENCOUNTER — Encounter (HOSPITAL_COMMUNITY): Payer: Self-pay | Admitting: *Deleted

## 2014-08-08 ENCOUNTER — Encounter (HOSPITAL_COMMUNITY): Payer: 59

## 2014-08-09 ENCOUNTER — Encounter (HOSPITAL_COMMUNITY): Payer: Self-pay

## 2014-08-09 ENCOUNTER — Other Ambulatory Visit: Payer: Self-pay | Admitting: Internal Medicine

## 2014-08-10 ENCOUNTER — Encounter (HOSPITAL_COMMUNITY): Payer: Self-pay

## 2014-08-10 ENCOUNTER — Encounter (HOSPITAL_COMMUNITY): Payer: 59

## 2014-08-11 ENCOUNTER — Encounter (HOSPITAL_COMMUNITY): Payer: Self-pay

## 2014-08-11 ENCOUNTER — Encounter (HOSPITAL_COMMUNITY)
Admission: RE | Admit: 2014-08-11 | Discharge: 2014-08-11 | Disposition: A | Discharge status: Home / Self Care | Payer: Self-pay | Source: Ambulatory Visit | Attending: Cardiology | Admitting: Cardiology

## 2014-08-12 ENCOUNTER — Encounter (HOSPITAL_COMMUNITY): Payer: 59

## 2014-08-15 ENCOUNTER — Encounter (HOSPITAL_COMMUNITY): Payer: 59

## 2014-08-16 ENCOUNTER — Encounter (HOSPITAL_COMMUNITY): Payer: Self-pay

## 2014-08-17 ENCOUNTER — Encounter (HOSPITAL_COMMUNITY): Payer: Self-pay

## 2014-08-17 ENCOUNTER — Encounter (HOSPITAL_COMMUNITY): Payer: 59

## 2014-08-18 ENCOUNTER — Encounter (HOSPITAL_COMMUNITY): Payer: Self-pay

## 2014-08-19 ENCOUNTER — Encounter (HOSPITAL_COMMUNITY): Payer: 59

## 2014-08-22 ENCOUNTER — Encounter (HOSPITAL_COMMUNITY): Payer: 59

## 2014-08-23 ENCOUNTER — Encounter (HOSPITAL_COMMUNITY): Payer: Self-pay

## 2014-08-24 ENCOUNTER — Encounter (HOSPITAL_COMMUNITY): Payer: Self-pay

## 2014-08-24 ENCOUNTER — Other Ambulatory Visit: Payer: Self-pay | Admitting: Internal Medicine

## 2014-08-24 ENCOUNTER — Encounter (HOSPITAL_COMMUNITY)
Admission: RE | Admit: 2014-08-24 | Discharge: 2014-08-24 | Disposition: A | Discharge status: Home / Self Care | Payer: Self-pay | Source: Ambulatory Visit | Attending: Cardiology | Admitting: Cardiology

## 2014-08-24 ENCOUNTER — Encounter (HOSPITAL_COMMUNITY): Payer: 59

## 2014-08-25 ENCOUNTER — Encounter (HOSPITAL_COMMUNITY)
Admission: RE | Admit: 2014-08-25 | Discharge: 2014-08-25 | Disposition: A | Discharge status: Home / Self Care | Payer: Self-pay | Source: Ambulatory Visit | Attending: Cardiology | Admitting: Cardiology

## 2014-08-25 ENCOUNTER — Encounter (HOSPITAL_COMMUNITY): Payer: Self-pay

## 2014-08-26 ENCOUNTER — Encounter (HOSPITAL_COMMUNITY): Payer: 59

## 2014-08-30 ENCOUNTER — Encounter (HOSPITAL_COMMUNITY): Payer: Self-pay

## 2014-08-30 ENCOUNTER — Encounter (HOSPITAL_COMMUNITY)
Admission: RE | Admit: 2014-08-30 | Discharge: 2014-08-30 | Disposition: A | Discharge status: Home / Self Care | Payer: Self-pay | Source: Ambulatory Visit | Attending: Cardiology | Admitting: Cardiology

## 2014-08-31 ENCOUNTER — Encounter (HOSPITAL_COMMUNITY): Payer: Self-pay

## 2014-08-31 ENCOUNTER — Encounter (HOSPITAL_COMMUNITY)
Admission: RE | Admit: 2014-08-31 | Discharge: 2014-08-31 | Disposition: A | Discharge status: Home / Self Care | Payer: Self-pay | Source: Ambulatory Visit | Attending: Cardiology | Admitting: Cardiology

## 2014-09-01 ENCOUNTER — Encounter (HOSPITAL_COMMUNITY)
Admission: RE | Admit: 2014-09-01 | Discharge: 2014-09-01 | Disposition: A | Discharge status: Home / Self Care | Payer: Self-pay | Source: Ambulatory Visit | Attending: Cardiology | Admitting: Cardiology

## 2014-09-01 ENCOUNTER — Encounter (HOSPITAL_COMMUNITY): Payer: Self-pay

## 2014-09-06 ENCOUNTER — Encounter (HOSPITAL_COMMUNITY)
Admission: RE | Admit: 2014-09-06 | Discharge: 2014-09-06 | Disposition: A | Discharge status: Home / Self Care | Payer: Self-pay | Source: Ambulatory Visit | Attending: Cardiology | Admitting: Cardiology

## 2014-09-06 DIAGNOSIS — Z955 Presence of coronary angioplasty implant and graft: Secondary | ICD-10-CM | POA: Insufficient documentation

## 2014-09-06 DIAGNOSIS — Z48812 Encounter for surgical aftercare following surgery on the circulatory system: Secondary | ICD-10-CM | POA: Insufficient documentation

## 2014-09-07 ENCOUNTER — Encounter (HOSPITAL_COMMUNITY): Payer: Self-pay

## 2014-09-08 ENCOUNTER — Encounter (HOSPITAL_COMMUNITY)
Admission: RE | Admit: 2014-09-08 | Discharge: 2014-09-08 | Disposition: A | Discharge status: Home / Self Care | Payer: Self-pay | Source: Ambulatory Visit | Attending: Cardiology | Admitting: Cardiology

## 2014-09-13 ENCOUNTER — Encounter (HOSPITAL_COMMUNITY)
Admission: RE | Admit: 2014-09-13 | Discharge: 2014-09-13 | Disposition: A | Discharge status: Home / Self Care | Payer: Self-pay | Source: Ambulatory Visit | Attending: Cardiology | Admitting: Cardiology

## 2014-09-14 ENCOUNTER — Encounter (HOSPITAL_COMMUNITY)
Admission: RE | Admit: 2014-09-14 | Discharge: 2014-09-14 | Disposition: A | Discharge status: Home / Self Care | Payer: Self-pay | Source: Ambulatory Visit | Attending: Cardiology | Admitting: Cardiology

## 2014-09-15 ENCOUNTER — Encounter (HOSPITAL_COMMUNITY): Payer: Self-pay

## 2014-09-17 ENCOUNTER — Other Ambulatory Visit: Payer: Self-pay | Admitting: Internal Medicine

## 2014-09-20 ENCOUNTER — Encounter (HOSPITAL_COMMUNITY)
Admission: RE | Admit: 2014-09-20 | Discharge: 2014-09-20 | Disposition: A | Discharge status: Home / Self Care | Payer: Self-pay | Source: Ambulatory Visit | Attending: Cardiology | Admitting: Cardiology

## 2014-09-20 ENCOUNTER — Telehealth: Payer: Self-pay

## 2014-09-20 NOTE — Telephone Encounter (Signed)
Manual fax received from [facility] on [address] indicating patient needs PA for Glyburide ID # [id number]  I called [phone] to initiate PA. Form will be faxed to the office. Once received form will be faxed back and await approval. DX E11.51

## 2014-09-21 ENCOUNTER — Encounter (HOSPITAL_COMMUNITY): Payer: Self-pay

## 2014-09-22 ENCOUNTER — Encounter (HOSPITAL_COMMUNITY)
Admission: RE | Admit: 2014-09-22 | Discharge: 2014-09-22 | Disposition: A | Discharge status: Home / Self Care | Payer: Self-pay | Source: Ambulatory Visit | Attending: Cardiology | Admitting: Cardiology

## 2014-09-23 NOTE — Telephone Encounter (Signed)
Received Prior Authorization Request Form from [facility] #: [phone] for patient's Glyburide Reference #: [id number] Fax #: [phone] Filled out and faxed back to [facility].

## 2014-09-26 NOTE — Telephone Encounter (Signed)
Received determination from [facility] and Glyburide was denied for coverage. Given to [name] for review.

## 2014-09-27 ENCOUNTER — Telehealth: Payer: Self-pay

## 2014-09-27 ENCOUNTER — Encounter (HOSPITAL_COMMUNITY)
Admission: RE | Admit: 2014-09-27 | Discharge: 2014-09-27 | Disposition: A | Discharge status: Home / Self Care | Payer: Self-pay | Source: Ambulatory Visit | Attending: Cardiology | Admitting: Cardiology

## 2014-09-27 NOTE — Telephone Encounter (Signed)
I have tried to call the patient at home to inform him of Dr. Thomasene Lot orders to change one of his medications.  Phone has been busy not able to leave any voice message will try again later.

## 2014-09-28 ENCOUNTER — Encounter (HOSPITAL_COMMUNITY): Payer: Self-pay

## 2014-09-28 ENCOUNTER — Telehealth: Payer: Self-pay | Admitting: *Deleted

## 2014-09-28 MED ORDER — GLIMEPIRIDE 1 MG PO TABS: 1.0000 mg | ORAL_TABLET | Freq: Every day | ORAL | Status: DC

## 2014-09-28 NOTE — Telephone Encounter (Signed)
Spoke with patient regarding medication changes, patient was taken off of glyburide and medication was replaced with glimipride 1 mg daily in the am.

## 2014-09-29 ENCOUNTER — Encounter (HOSPITAL_COMMUNITY): Payer: Self-pay

## 2014-10-04 ENCOUNTER — Encounter (HOSPITAL_COMMUNITY)
Admission: RE | Admit: 2014-10-04 | Discharge: 2014-10-04 | Disposition: A | Discharge status: Home / Self Care | Payer: Self-pay | Source: Ambulatory Visit | Attending: Cardiology | Admitting: Cardiology

## 2014-10-04 DIAGNOSIS — Z48812 Encounter for surgical aftercare following surgery on the circulatory system: Secondary | ICD-10-CM | POA: Insufficient documentation

## 2014-10-04 DIAGNOSIS — Z955 Presence of coronary angioplasty implant and graft: Secondary | ICD-10-CM | POA: Insufficient documentation

## 2014-10-05 ENCOUNTER — Encounter (HOSPITAL_COMMUNITY): Payer: Self-pay

## 2014-10-06 ENCOUNTER — Encounter (HOSPITAL_COMMUNITY): Payer: Self-pay

## 2014-10-11 ENCOUNTER — Encounter (HOSPITAL_COMMUNITY): Payer: Self-pay

## 2014-10-12 ENCOUNTER — Encounter (HOSPITAL_COMMUNITY)
Admission: RE | Admit: 2014-10-12 | Discharge: 2014-10-12 | Disposition: A | Discharge status: Home / Self Care | Payer: Self-pay | Source: Ambulatory Visit | Attending: Cardiology | Admitting: Cardiology

## 2014-10-13 ENCOUNTER — Encounter (HOSPITAL_COMMUNITY): Payer: Self-pay

## 2014-10-18 ENCOUNTER — Encounter: Payer: Self-pay | Admitting: Internal Medicine

## 2014-10-18 ENCOUNTER — Encounter (HOSPITAL_COMMUNITY)
Admission: RE | Admit: 2014-10-18 | Discharge: 2014-10-18 | Disposition: A | Discharge status: Home / Self Care | Payer: Self-pay | Source: Ambulatory Visit | Attending: Cardiology | Admitting: Cardiology

## 2014-10-18 ENCOUNTER — Ambulatory Visit (INDEPENDENT_AMBULATORY_CARE_PROVIDER_SITE_OTHER): Payer: Medicare Other | Admitting: Internal Medicine

## 2014-10-18 VITALS — BP 120/78 | HR 66 | Temp 97.7°F | Resp 20 | Ht 68.0 in | Wt 177.2 lb

## 2014-10-18 DIAGNOSIS — R748 Abnormal levels of other serum enzymes: Secondary | ICD-10-CM | POA: Diagnosis not present

## 2014-10-18 DIAGNOSIS — R499 Unspecified voice and resonance disorder: Secondary | ICD-10-CM | POA: Diagnosis not present

## 2014-10-18 DIAGNOSIS — L989 Disorder of the skin and subcutaneous tissue, unspecified: Secondary | ICD-10-CM | POA: Diagnosis not present

## 2014-10-18 DIAGNOSIS — I798 Other disorders of arteries, arterioles and capillaries in diseases classified elsewhere: Secondary | ICD-10-CM | POA: Diagnosis not present

## 2014-10-18 DIAGNOSIS — D509 Iron deficiency anemia, unspecified: Secondary | ICD-10-CM

## 2014-10-18 DIAGNOSIS — J988 Other specified respiratory disorders: Secondary | ICD-10-CM

## 2014-10-18 DIAGNOSIS — I10 Essential (primary) hypertension: Secondary | ICD-10-CM | POA: Diagnosis not present

## 2014-10-18 DIAGNOSIS — E785 Hyperlipidemia, unspecified: Secondary | ICD-10-CM | POA: Diagnosis not present

## 2014-10-18 DIAGNOSIS — I200 Unstable angina: Secondary | ICD-10-CM | POA: Diagnosis not present

## 2014-10-18 DIAGNOSIS — M25552 Pain in left hip: Secondary | ICD-10-CM | POA: Diagnosis not present

## 2014-10-18 DIAGNOSIS — G2581 Restless legs syndrome: Secondary | ICD-10-CM | POA: Diagnosis not present

## 2014-10-18 DIAGNOSIS — R0602 Shortness of breath: Secondary | ICD-10-CM | POA: Diagnosis not present

## 2014-10-18 DIAGNOSIS — E1151 Type 2 diabetes mellitus with diabetic peripheral angiopathy without gangrene: Secondary | ICD-10-CM | POA: Diagnosis not present

## 2014-10-18 DIAGNOSIS — I25118 Atherosclerotic heart disease of native coronary artery with other forms of angina pectoris: Secondary | ICD-10-CM | POA: Diagnosis not present

## 2014-10-18 NOTE — Progress Notes (Signed)
Patient ID: Kenneth Otten Sr., male   DOB: 07/10/1950, 64 y.o.   MRN: [id number]    Facility  [facility]    Place of Service:   OFFICE    Allergies  Allergen Reactions  . Statins Other (See Comments)    Causes Restless Legs, rhabdomyalysis    Chief Complaint  Patient presents with  . 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  Essential hypertension - blood pressure running slightly low at this time  Hyperlipidemia - under excellent control with the use of alirocumab  Restless legs syndrome (RLS) - under much better control  Shortness of breath - improving  Coronary artery disease involving native coronary artery of native heart with other form of angina pectoris - no recent chest pain or palpitations  Elevated CPK - associated with previous medications to lower cholesterol. Needs follow-up.  Left-sided ischial pain - tender to palpation and with walking. Uncomfortable to sit down.  Scalp lesion - actinic keratosis versus squamous cell cancer  Congestion of respiratory tract - improved  Voice disorder - mild dysphonia. Possibly related allergies.     Medications: Patient's Medications  New Prescriptions   No medications on file  Previous Medications   ACETAMINOPHEN (TYLENOL) 500 MG TABLET    Take 1,000-1,500 mg by mouth every 6 (six) hours as needed for headache.   ALIROCUMAB 75 MG/ML SOPN    Inject 1 each into the skin. Every 2 weeks for cholesterol   AMBULATORY NON FORMULARY MEDICATION    One Touch Ultra Test Strips Sig: Use to test blood sugar four times daily. Dx: E11.51, I10   ASPIRIN 81 MG EC TABLET    Take 1 tablet (81 mg total) by mouth daily.   BD PEN NEEDLE NANO U/F 32G X 4 MM MISC    USE ONE NEEDLE DAILY WITH LANTUS PEN   CARVEDILOL (COREG) 6.25 MG TABLET    Take 0.5 tablets (3.125 mg total) by mouth 2 (two) times daily with a meal.   FERROUS SULFATE 325 (65 FE) MG TABLET     One daily to treat anemia   GLIMEPIRIDE (AMARYL) 1 MG TABLET    Take 1 tablet (1 mg total) by mouth daily with breakfast. To control diabetes   INSULIN GLARGINE (LANTUS) 100 UNIT/ML SOPN    Inject 20-26 Units into the skin 2 (two) times daily. 22-24 units in the morning and 26 units in the evening.   LANTUS SOLOSTAR 100 UNIT/ML SOLOSTAR PEN    INJECT 20 UNITS IN THE MORNING AND 24 UNITS IN THE EVENING TO CONTROL DIABETES   NITROGLYCERIN (NITROSTAT) 0.4 MG SL TABLET    Place 0.4 mg under the tongue every 5 (five) minutes as needed for chest pain.   ONE TOUCH ULTRA TEST TEST STRIP    TEST TWICE A DAY   PANTOPRAZOLE (PROTONIX) 40 MG TABLET    TAKE 1 TABLET (40 MG TOTAL) BY MOUTH 2 (TWO) TIMES DAILY.   PRAMIPEXOLE (MIRAPEX) 1 MG TABLET    One twice daily to help restless legs   PRASUGREL (EFFIENT) 10 MG TABS TABLET    Take 1 tablet (10 mg total) by mouth daily.  Modified Medications   No medications on file  Discontinued Medications   CARBIDOPA-LEVODOPA (SINEMET IR) 25-100 MG PER TABLET    One up to 3 time daily before meals to help prevent leg spasm   CARBIDOPA-LEVODOPA ER (SINEMET CR) 25-100  MG TABLET CONTROLLED RELEASE    Take 1 tablet by mouth daily.    PRAMIPEXOLE (MIRAPEX) 0.75 MG TABLET    TAKE 1/2 TABLET BY MOUTH EVERY NIGHT FOR RESTLESS LEGS     Review of Systems  Constitutional: Negative for fever and fatigue.  HENT: Positive for hearing loss. Negative for congestion and ear pain.   Eyes: Negative.        Wears prescription lenses.  Respiratory: Negative for cough, choking, chest tightness, shortness of breath (with exertion) and wheezing.        Voice change. Dysphonia. 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  Cardiovascular: Negative for chest pain, palpitations and leg swelling.  Gastrointestinal: Negative for nausea, vomiting, abdominal pain, diarrhea, constipation and abdominal distention.       Barrett's esophagus and reflux history  Endocrine: Negative for cold intolerance, heat  intolerance, polydipsia, polyphagia and polyuria.       Diabetes under poor control.  Genitourinary: Negative.   Musculoskeletal: Positive for back pain, joint swelling, arthralgias and gait problem. Negative for myalgias, neck pain and neck stiffness.       Tender left sacroiliac joint area and at the ischium on palpation.  Skin: Negative.   Allergic/Immunologic: Negative.   Neurological: Positive for weakness, numbness (to bilateral feet, with  restless legs) and headaches. Negative for dizziness, tremors, seizures, syncope, facial asymmetry, speech difficulty and light-headedness.       Improvement in his restless legs  Hematological: Bruises/bleeds easily.       Iron deficiency anemia without obvious blood loss.  Psychiatric/Behavioral: The patient is nervous/anxious.     Filed Vitals:   10/18/14 1624  BP: 120/78  Pulse: 66  Temp: 97.7 F (36.5 C)  TempSrc: Oral  Resp: 20  Height:  (1.727 m)  Weight: 177 lb 3.2 oz (80.377 kg)  SpO2: 98%   Body mass index is 26.95 kg/(m^2).  Physical Exam  Constitutional: He is oriented to person, place, and time. He appears well-developed and well-nourished. No distress.  overweight  HENT:  Head: Normocephalic and atraumatic.  Right Ear: External ear normal.  Left Ear: External ear normal.  Nose: Nose normal.  Mouth/Throat: Oropharynx is clear and moist.  Eyes: Conjunctivae and EOM are normal. Pupils are equal, round, and reactive to light.  Neck: Normal range of motion. Neck supple. No JVD present. No tracheal deviation present. No thyromegaly present.  Cardiovascular: Normal rate, regular rhythm, normal heart sounds and intact distal pulses.  Exam reveals no gallop and no friction rub.   No murmur heard. Pulmonary/Chest: Effort normal. No respiratory distress. He has no wheezes. He has no rales. He exhibits no tenderness.  Abdominal: Soft. Bowel sounds are normal. He exhibits no distension and no mass.  Musculoskeletal: He  exhibits no edema or tenderness.  Bilateral shoulder discomfort with some restriction of movement especially at the left shoulder. Painful to raise left arm. Painful to lift items and to rotate at left shoulder. Has been seen by Dr. Thurston Hole. Chronic neck pains. Tender at insertion of the left Achilles tendon.  Lymphadenopathy:    He has no cervical adenopathy.  Neurological: He is alert and oriented to person, place, and time. He has normal reflexes. No cranial nerve deficit. Coordination normal.  Bilateral restless legs.  Skin: Skin is warm and dry. No rash noted. No erythema. No pallor.  Psychiatric: He has a normal mood and affect. His behavior is normal. Judgment and thought content normal.     Labs reviewed: No visits with  results within 3 Month(s) from this visit. Latest known visit with results is:  Office Visit on 07/06/2014  Component Date Value Ref Range Status  . Triglycerides 06/23/2014 127  40 - 160 mg/dL Final  . Cholesterol 16/12/9602 89  0 - 200 mg/dL Final  . HDL 54/11/8117 38  35 - 70 mg/dL Final  . LDL Cholesterol 06/23/2014 26   Final     Assessment/Plan  1. Controlled type 2 DM with peripheral circulatory disorder Continue current medication - Hemoglobin A1c; Future - Basic metabolic panel; Future  2. Essential hypertension Continue current medication - Basic metabolic panel; Future  3. Hyperlipidemia Continue alirocumab - Lipid panel; Future  4. Restless legs syndrome (RLS) Improved  5. Shortness of breath Stable to slightly improved  6. Coronary artery disease involving native coronary artery of native heart with other form of angina pectoris Denies angina or palpitations. Stable.  7. Elevated CPK - CK; Future  8. Left-sided ischial pain Watchful waiting at this time  9. Scalp lesion Watchful waiting   10. Congestion of respiratory tract Mucinex or similar should be tried  11. Voice disorder Continue to observe. May ultimately need  referral to ENT for vocal cord visualization if this disorder persist.  12. Iron deficiency anemia Routine follow-up - CBC With Differential; Future

## 2014-10-18 NOTE — Patient Instructions (Addendum)
Try Zyrtec for possible allergy causing growl and respiratory secretions. Return for lab tomorrow.

## 2014-10-19 ENCOUNTER — Encounter (HOSPITAL_COMMUNITY): Payer: Self-pay

## 2014-10-19 ENCOUNTER — Other Ambulatory Visit: Payer: Medicare Other

## 2014-10-19 DIAGNOSIS — M25552 Pain in left hip: Secondary | ICD-10-CM | POA: Insufficient documentation

## 2014-10-19 DIAGNOSIS — E1151 Type 2 diabetes mellitus with diabetic peripheral angiopathy without gangrene: Secondary | ICD-10-CM

## 2014-10-19 DIAGNOSIS — I798 Other disorders of arteries, arterioles and capillaries in diseases classified elsewhere: Secondary | ICD-10-CM | POA: Diagnosis not present

## 2014-10-19 DIAGNOSIS — R499 Unspecified voice and resonance disorder: Secondary | ICD-10-CM | POA: Insufficient documentation

## 2014-10-19 DIAGNOSIS — J988 Other specified respiratory disorders: Secondary | ICD-10-CM | POA: Insufficient documentation

## 2014-10-19 DIAGNOSIS — D509 Iron deficiency anemia, unspecified: Secondary | ICD-10-CM

## 2014-10-19 DIAGNOSIS — L989 Disorder of the skin and subcutaneous tissue, unspecified: Secondary | ICD-10-CM | POA: Insufficient documentation

## 2014-10-20 ENCOUNTER — Encounter (HOSPITAL_COMMUNITY): Payer: Self-pay

## 2014-10-20 LAB — IRON AND TIBC
Iron Saturation: 73 % — ABNORMAL HIGH (ref 15–55)
Iron: 191 ug/dL — ABNORMAL HIGH (ref 38–169)
Total Iron Binding Capacity: 262 ug/dL (ref 250–450)
UIBC: 71 ug/dL — ABNORMAL LOW (ref 111–343)

## 2014-10-20 LAB — CBC WITH DIFFERENTIAL
Basophils Absolute: 0 10*3/uL (ref 0.0–0.2)
Basos: 0 %
EOS (ABSOLUTE): 0.1 10*3/uL (ref 0.0–0.4)
EOS: 2 %
HEMATOCRIT: 41.5 % (ref 37.5–51.0)
Hemoglobin: 14 g/dL (ref 12.6–17.7)
IMMATURE GRANS (ABS): 0 10*3/uL (ref 0.0–0.1)
Immature Granulocytes: 0 %
LYMPHS: 32 %
Lymphocytes Absolute: 1.6 10*3/uL (ref 0.7–3.1)
MCH: 28.8 pg (ref 26.6–33.0)
MCHC: 33.7 g/dL (ref 31.5–35.7)
MCV: 85 fL (ref 79–97)
Monocytes Absolute: 0.4 10*3/uL (ref 0.1–0.9)
Monocytes: 9 %
Neutrophils Absolute: 2.8 10*3/uL (ref 1.4–7.0)
Neutrophils: 57 %
RBC: 4.86 x10E6/uL (ref 4.14–5.80)
RDW: 15.5 % — AB (ref 12.3–15.4)
WBC: 4.8 10*3/uL (ref 3.4–10.8)

## 2014-10-20 LAB — BASIC METABOLIC PANEL
BUN/Creatinine Ratio: 16 (ref 10–22)
BUN: 17 mg/dL (ref 8–27)
CHLORIDE: 101 mmol/L (ref 97–108)
CO2: 25 mmol/L (ref 18–29)
Calcium: 9 mg/dL (ref 8.6–10.2)
Creatinine, Ser: 1.06 mg/dL (ref 0.76–1.27)
GFR calc Af Amer: 85 mL/min/{1.73_m2} (ref >59)
GFR calc non Af Amer: 73 mL/min/{1.73_m2} (ref >59)
GLUCOSE: 113 mg/dL — AB (ref 65–99)
POTASSIUM: 4.6 mmol/L (ref 3.5–5.2)
SODIUM: 141 mmol/L (ref 134–144)

## 2014-10-20 LAB — HEMOGLOBIN A1C
ESTIMATED AVERAGE GLUCOSE: 151 mg/dL
Hgb A1c MFr Bld: 6.9 % — ABNORMAL HIGH (ref 4.8–5.6)

## 2014-10-20 LAB — RETICULOCYTES: Retic Ct Pct: 1.4 % (ref 0.6–2.6)

## 2014-10-25 ENCOUNTER — Encounter (HOSPITAL_COMMUNITY)
Admission: RE | Admit: 2014-10-25 | Discharge: 2014-10-25 | Disposition: A | Discharge status: Home / Self Care | Payer: Self-pay | Source: Ambulatory Visit | Attending: Cardiology | Admitting: Cardiology

## 2014-10-25 ENCOUNTER — Other Ambulatory Visit: Payer: Self-pay | Admitting: *Deleted

## 2014-10-25 MED ORDER — PANTOPRAZOLE SODIUM 40 MG PO TBEC: DELAYED_RELEASE_TABLET | ORAL | Status: DC

## 2014-10-25 NOTE — Telephone Encounter (Signed)
Patient requested refill to be faxed to pharmacy.  

## 2014-10-26 ENCOUNTER — Encounter (HOSPITAL_COMMUNITY): Payer: Self-pay

## 2014-10-27 ENCOUNTER — Encounter (HOSPITAL_COMMUNITY)
Admission: RE | Admit: 2014-10-27 | Discharge: 2014-10-27 | Disposition: A | Discharge status: Home / Self Care | Payer: Self-pay | Source: Ambulatory Visit | Attending: Cardiology | Admitting: Cardiology

## 2014-11-01 ENCOUNTER — Encounter (HOSPITAL_COMMUNITY)
Admission: RE | Admit: 2014-11-01 | Discharge: 2014-11-01 | Disposition: A | Discharge status: Home / Self Care | Payer: Self-pay | Source: Ambulatory Visit | Attending: Cardiology | Admitting: Cardiology

## 2014-11-01 NOTE — Telephone Encounter (Signed)
Patient seen in office since medication denied.

## 2014-11-02 ENCOUNTER — Encounter (HOSPITAL_COMMUNITY): Payer: Self-pay

## 2014-11-03 ENCOUNTER — Encounter (HOSPITAL_COMMUNITY)
Admission: RE | Admit: 2014-11-03 | Discharge: 2014-11-03 | Disposition: A | Discharge status: Home / Self Care | Payer: Self-pay | Source: Ambulatory Visit | Attending: Cardiology | Admitting: Cardiology

## 2014-11-03 DIAGNOSIS — Z955 Presence of coronary angioplasty implant and graft: Secondary | ICD-10-CM | POA: Insufficient documentation

## 2014-11-03 DIAGNOSIS — Z48812 Encounter for surgical aftercare following surgery on the circulatory system: Secondary | ICD-10-CM | POA: Insufficient documentation

## 2014-11-08 ENCOUNTER — Encounter (HOSPITAL_COMMUNITY): Payer: Self-pay

## 2014-11-09 ENCOUNTER — Encounter (HOSPITAL_COMMUNITY): Payer: Self-pay

## 2014-11-10 ENCOUNTER — Encounter (HOSPITAL_COMMUNITY): Payer: Self-pay

## 2014-11-15 ENCOUNTER — Other Ambulatory Visit: Payer: Self-pay | Admitting: *Deleted

## 2014-11-15 ENCOUNTER — Encounter (HOSPITAL_COMMUNITY)
Admission: RE | Admit: 2014-11-15 | Discharge: 2014-11-15 | Disposition: A | Discharge status: Home / Self Care | Payer: Self-pay | Source: Ambulatory Visit | Attending: Cardiology | Admitting: Cardiology

## 2014-11-15 MED ORDER — GLUCOSE BLOOD VI STRP: ORAL_STRIP | Status: DC

## 2014-11-15 NOTE — Telephone Encounter (Signed)
Patient requested to be faxed to pharmacy 

## 2014-11-16 ENCOUNTER — Encounter (HOSPITAL_COMMUNITY): Payer: Self-pay

## 2014-11-17 ENCOUNTER — Encounter (HOSPITAL_COMMUNITY): Payer: Self-pay

## 2014-11-22 ENCOUNTER — Encounter (HOSPITAL_COMMUNITY)
Admission: RE | Admit: 2014-11-22 | Discharge: 2014-11-22 | Disposition: A | Discharge status: Home / Self Care | Payer: Self-pay | Source: Ambulatory Visit | Attending: Cardiology | Admitting: Cardiology

## 2014-11-23 ENCOUNTER — Encounter (HOSPITAL_COMMUNITY): Payer: Self-pay

## 2014-11-24 ENCOUNTER — Encounter (HOSPITAL_COMMUNITY)
Admission: RE | Admit: 2014-11-24 | Discharge: 2014-11-24 | Disposition: A | Discharge status: Home / Self Care | Payer: Self-pay | Source: Ambulatory Visit | Attending: Cardiology | Admitting: Cardiology

## 2014-11-29 ENCOUNTER — Encounter (HOSPITAL_COMMUNITY)
Admission: RE | Admit: 2014-11-29 | Discharge: 2014-11-29 | Disposition: A | Discharge status: Home / Self Care | Payer: Self-pay | Source: Ambulatory Visit | Attending: Cardiology | Admitting: Cardiology

## 2014-11-30 ENCOUNTER — Encounter (HOSPITAL_COMMUNITY): Payer: Self-pay

## 2014-11-30 ENCOUNTER — Ambulatory Visit (INDEPENDENT_AMBULATORY_CARE_PROVIDER_SITE_OTHER): Payer: Medicare Other | Admitting: Internal Medicine

## 2014-11-30 ENCOUNTER — Encounter: Payer: Self-pay | Admitting: Internal Medicine

## 2014-11-30 VITALS — BP 126/78 | HR 55 | Temp 98.2°F | Ht 68.0 in | Wt 180.4 lb

## 2014-11-30 DIAGNOSIS — R499 Unspecified voice and resonance disorder: Secondary | ICD-10-CM | POA: Diagnosis not present

## 2014-11-30 DIAGNOSIS — I10 Essential (primary) hypertension: Secondary | ICD-10-CM

## 2014-11-30 DIAGNOSIS — M6282 Rhabdomyolysis: Secondary | ICD-10-CM

## 2014-11-30 DIAGNOSIS — I200 Unstable angina: Secondary | ICD-10-CM

## 2014-11-30 NOTE — Progress Notes (Signed)
Patient ID: Kenneth Parlato Sr., male   DOB: [birth year], 64 y.o.   MRN: [id number]    Facility  [facility]    Place of Service:   OFFICE    Allergies  Allergen Reactions  . Statins Other (See Comments)    Causes Restless Legs, rhabdomyalysis    Chief Complaint  Patient presents with  . Acute Visit    Still having throat problems. Rhasp in voice but no sorethroat.    HPI:  Voice disorder - continues with hoarseness and a rattling feeling in his throat. He cannot sing in [place] note. He denies pain in the chest. There has been no fever or night sweats. He would like a referral to ENT.  Essential hypertension - controlled    Medications: Patient's Medications  New Prescriptions   No medications on file  Previous Medications   ACETAMINOPHEN (TYLENOL) 500 MG TABLET    Take 1,000-1,500 mg by mouth every 6 (six) hours as needed for headache.   ALIROCUMAB 75 MG/ML SOPN    Inject 1 each into the skin. Every 2 weeks for cholesterol   AMBULATORY NON FORMULARY MEDICATION    One Touch Ultra Test Strips Sig: Use to test blood sugar four times daily. Dx: E11.51, I10   ASPIRIN 81 MG EC TABLET    Take 1 tablet (81 mg total) by mouth daily.   BD PEN NEEDLE NANO U/F 32G X 4 MM MISC    USE ONE NEEDLE DAILY WITH LANTUS PEN   CARVEDILOL (COREG) 6.25 MG TABLET    Take 0.5 tablets (3.125 mg total) by mouth 2 (two) times daily with a meal.   FERROUS SULFATE 325 (65 FE) MG TABLET    One daily to treat anemia   GLIMEPIRIDE (AMARYL) 1 MG TABLET    Take 1 tablet (1 mg total) by mouth daily with breakfast. To control diabetes   GLUCOSE BLOOD (ONE TOUCH ULTRA TEST) TEST STRIP    Use to test blood sugar four times daily. Dx: E11.51   INSULIN GLARGINE (LANTUS) 100 UNIT/ML SOPN    Inject 20-26 Units into the skin 2 (two) times daily. 22-24 units in the morning and 26 units in the evening.   LANTUS SOLOSTAR 100 UNIT/ML SOLOSTAR PEN    INJECT 20 UNITS IN THE MORNING AND 24 UNITS IN THE EVENING TO CONTROL DIABETES   NITROGLYCERIN (NITROSTAT) 0.4 MG SL TABLET    Place 0.4 mg under the tongue every 5 (five) minutes as needed for chest pain.   PANTOPRAZOLE (PROTONIX) 40 MG TABLET    Take one tablet by mouth twice daily for stomach   PRAMIPEXOLE (MIRAPEX) 1 MG TABLET    One twice daily to help restless legs   PRASUGREL (EFFIENT) 10 MG TABS TABLET    Take 1 tablet (10 mg total) by mouth daily.  Modified Medications   No medications on file  Discontinued Medications   No medications on file     Review of Systems  Constitutional: Negative for fever and fatigue.  HENT: Positive for hearing loss and voice change (Hoarseness which has persisted for several weeks. History of vocal cord polyp about 15 years ago.). Negative for congestion and ear pain.   Eyes: Negative.        Wears prescription lenses.  Respiratory: Negative for cough, choking, chest tightness, shortness of breath (with exertion) and wheezing.        Voice change. Dysphonia. 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  Cardiovascular: Negative for chest pain, palpitations and leg  swelling.  Gastrointestinal: Negative for nausea, vomiting, abdominal pain, diarrhea, constipation and abdominal distention.       Barrett's esophagus and reflux history  Endocrine: Negative for cold intolerance, heat intolerance, polydipsia, polyphagia and polyuria.       Diabetes under poor control.  Genitourinary: Negative.   Musculoskeletal: Positive for back pain, joint swelling, arthralgias and gait problem. Negative for myalgias, neck pain and neck stiffness.       Tender left sacroiliac joint area and at the ischium on palpation.  Skin: Negative.   Allergic/Immunologic: Negative.   Neurological: Positive for weakness, numbness (to bilateral feet, with  restless legs) and headaches. Negative for dizziness, tremors, seizures, syncope, facial asymmetry, speech difficulty and light-headedness.       Improvement in his restless legs  Hematological: Bruises/bleeds easily.        Iron deficiency anemia without obvious blood loss.  Psychiatric/Behavioral: The patient is nervous/anxious.     Filed Vitals:   11/30/14 1309  BP: 126/78  Pulse: 55  Temp: 98.2 F (36.8 C)  TempSrc: Oral  Height: 5' 8"  (1.727 m)  Weight: 180 lb 6.4 oz (81.829 kg)  SpO2: 96%   Body mass index is 27.44 kg/(m^2).  Physical Exam  Constitutional: He is oriented to person, place, and time. He appears well-developed and well-nourished. No distress.  overweight  HENT:  Head: Normocephalic and atraumatic.  Right Ear: External ear normal.  Left Ear: External ear normal.  Nose: Nose normal.  Mouth/Throat: Oropharynx is clear and moist.  There is a hoarse quality to his voice as he speaks.  Eyes: Conjunctivae and EOM are normal. Pupils are equal, round, and reactive to light.  Neck: Normal range of motion. Neck supple. No JVD present. No tracheal deviation present. No thyromegaly present.  Cardiovascular: Normal rate, regular rhythm, normal heart sounds and intact distal pulses.  Exam reveals no gallop and no friction rub.   No murmur heard. Pulmonary/Chest: Effort normal. No respiratory distress. He has no wheezes. He has no rales. He exhibits no tenderness.  Abdominal: Soft. Bowel sounds are normal. He exhibits no distension and no mass.  Musculoskeletal: He exhibits no edema or tenderness.  Bilateral shoulder discomfort with some restriction of movement especially at the left shoulder. Painful to raise left arm. Painful to lift items and to rotate at left shoulder. Has been seen by Dr. Noemi Chapel. Chronic neck pains. Tender at insertion of the left Achilles tendon.  Lymphadenopathy:    He has no cervical adenopathy.  Neurological: He is alert and oriented to person, place, and time. He has normal reflexes. No cranial nerve deficit. Coordination normal.  Bilateral restless legs.  Skin: Skin is warm and dry. No rash noted. No erythema. No pallor.  Psychiatric: He has a normal mood and  affect. His behavior is normal. Judgment and thought content normal.     Labs reviewed: Lab Summary Latest Ref Rng 10/19/2014  Hemoglobin 12.6 - 17.7 g/dL 14.0  Hematocrit 37.5 - 51.0 % 41.5  White count 3.4 - 10.8 x10E3/uL 4.8  Platelet count - (None)  Sodium 134 - 144 mmol/L 141  Potassium 3.5 - 5.2 mmol/L 4.6  Calcium 8.6 - 10.2 mg/dL 9.0  Phosphorus - (None)  Creatinine 0.76 - 1.27 mg/dL 1.06  AST - (None)  Alk Phos - (None)  Bilirubin - (None)  Glucose 65 - 99 mg/dL 113(H)  Cholesterol - (None)  HDL cholesterol - (None)  Triglycerides - (None)  LDL Direct - (None)  LDL Calc - (  None)  Total protein - (None)  Albumin - (None)   Lab Results  Component Value Date   TSH 4.810* 09/19/2013   Lab Results  Component Value Date   BUN 17 10/19/2014   Lab Results  Component Value Date   HGBA1C 6.9* 10/19/2014       Assessment/Plan  1. Voice disorder - ENT REFERRAL  2. Essential hypertension controlled  3. Non-traumatic rhabdomyolysis followup at next visit

## 2014-12-01 ENCOUNTER — Encounter (HOSPITAL_COMMUNITY)
Admission: RE | Admit: 2014-12-01 | Discharge: 2014-12-01 | Disposition: A | Discharge status: Home / Self Care | Payer: Self-pay | Source: Ambulatory Visit | Attending: Cardiology | Admitting: Cardiology

## 2014-12-08 ENCOUNTER — Encounter (HOSPITAL_COMMUNITY): Payer: Self-pay

## 2014-12-08 DIAGNOSIS — Z955 Presence of coronary angioplasty implant and graft: Secondary | ICD-10-CM | POA: Insufficient documentation

## 2014-12-08 DIAGNOSIS — I214 Non-ST elevation (NSTEMI) myocardial infarction: Secondary | ICD-10-CM | POA: Insufficient documentation

## 2014-12-13 ENCOUNTER — Encounter (HOSPITAL_COMMUNITY)
Admission: RE | Admit: 2014-12-13 | Discharge: 2014-12-13 | Disposition: A | Discharge status: Home / Self Care | Payer: Self-pay | Source: Ambulatory Visit | Attending: Cardiology | Admitting: Cardiology

## 2014-12-13 DIAGNOSIS — K219 Gastro-esophageal reflux disease without esophagitis: Secondary | ICD-10-CM | POA: Diagnosis not present

## 2014-12-13 DIAGNOSIS — R490 Dysphonia: Secondary | ICD-10-CM | POA: Diagnosis not present

## 2014-12-14 ENCOUNTER — Encounter (HOSPITAL_COMMUNITY): Payer: Self-pay

## 2014-12-15 ENCOUNTER — Encounter (HOSPITAL_COMMUNITY)
Admission: RE | Admit: 2014-12-15 | Discharge: 2014-12-15 | Disposition: A | Discharge status: Home / Self Care | Payer: Self-pay | Source: Ambulatory Visit | Attending: Cardiology | Admitting: Cardiology

## 2014-12-20 ENCOUNTER — Encounter (HOSPITAL_COMMUNITY): Payer: Self-pay

## 2014-12-21 ENCOUNTER — Encounter (HOSPITAL_COMMUNITY): Payer: Self-pay

## 2014-12-22 ENCOUNTER — Encounter (HOSPITAL_COMMUNITY): Payer: Self-pay

## 2014-12-27 ENCOUNTER — Encounter (HOSPITAL_COMMUNITY): Payer: Self-pay

## 2014-12-28 ENCOUNTER — Encounter (HOSPITAL_COMMUNITY): Payer: Self-pay

## 2014-12-29 ENCOUNTER — Encounter (HOSPITAL_COMMUNITY): Payer: Self-pay

## 2015-01-03 ENCOUNTER — Encounter (HOSPITAL_COMMUNITY): Payer: Self-pay

## 2015-01-04 ENCOUNTER — Encounter (HOSPITAL_COMMUNITY): Payer: Self-pay

## 2015-01-05 ENCOUNTER — Encounter (HOSPITAL_COMMUNITY): Payer: Self-pay

## 2015-01-10 ENCOUNTER — Encounter (HOSPITAL_COMMUNITY): Payer: Self-pay

## 2015-01-11 ENCOUNTER — Encounter (HOSPITAL_COMMUNITY): Payer: Self-pay

## 2015-01-12 ENCOUNTER — Encounter (HOSPITAL_COMMUNITY): Payer: Self-pay

## 2015-01-12 DIAGNOSIS — E119 Type 2 diabetes mellitus without complications: Secondary | ICD-10-CM | POA: Diagnosis not present

## 2015-01-12 DIAGNOSIS — Z794 Long term (current) use of insulin: Secondary | ICD-10-CM | POA: Diagnosis not present

## 2015-01-16 ENCOUNTER — Other Ambulatory Visit: Payer: Self-pay | Admitting: *Deleted

## 2015-01-16 MED ORDER — CARVEDILOL 6.25 MG PO TABS: 3.1250 mg | ORAL_TABLET | Freq: Two times a day (BID) | ORAL | Status: DC

## 2015-01-16 NOTE — Telephone Encounter (Signed)
Patient requested and faxed to pharmacy 

## 2015-01-17 ENCOUNTER — Encounter (HOSPITAL_COMMUNITY): Payer: Self-pay

## 2015-01-17 ENCOUNTER — Other Ambulatory Visit: Payer: Self-pay

## 2015-01-17 MED ORDER — GLUCOSE BLOOD VI STRP: ORAL_STRIP | Status: DC

## 2015-01-18 ENCOUNTER — Encounter (HOSPITAL_COMMUNITY): Payer: Self-pay

## 2015-01-19 ENCOUNTER — Encounter (HOSPITAL_COMMUNITY): Payer: Self-pay

## 2015-01-24 ENCOUNTER — Encounter (HOSPITAL_COMMUNITY): Payer: Self-pay

## 2015-01-25 ENCOUNTER — Encounter (HOSPITAL_COMMUNITY): Payer: Self-pay

## 2015-01-31 ENCOUNTER — Encounter (HOSPITAL_COMMUNITY): Payer: Self-pay

## 2015-02-01 ENCOUNTER — Encounter (HOSPITAL_COMMUNITY): Payer: Self-pay

## 2015-02-02 ENCOUNTER — Encounter (HOSPITAL_COMMUNITY): Payer: Self-pay

## 2015-02-07 ENCOUNTER — Encounter (HOSPITAL_COMMUNITY): Payer: Self-pay

## 2015-02-08 ENCOUNTER — Encounter (HOSPITAL_COMMUNITY): Payer: Self-pay

## 2015-02-09 ENCOUNTER — Encounter (HOSPITAL_COMMUNITY): Payer: Self-pay

## 2015-02-10 ENCOUNTER — Other Ambulatory Visit: Payer: Medicare Other

## 2015-02-10 DIAGNOSIS — E1151 Type 2 diabetes mellitus with diabetic peripheral angiopathy without gangrene: Secondary | ICD-10-CM

## 2015-02-10 DIAGNOSIS — E785 Hyperlipidemia, unspecified: Secondary | ICD-10-CM | POA: Diagnosis not present

## 2015-02-10 DIAGNOSIS — I10 Essential (primary) hypertension: Secondary | ICD-10-CM

## 2015-02-10 DIAGNOSIS — R748 Abnormal levels of other serum enzymes: Secondary | ICD-10-CM

## 2015-02-10 DIAGNOSIS — D509 Iron deficiency anemia, unspecified: Secondary | ICD-10-CM

## 2015-02-11 LAB — CBC WITH DIFFERENTIAL
BASOS: 0 %
Basophils Absolute: 0 10*3/uL (ref 0.0–0.2)
EOS (ABSOLUTE): 0.1 10*3/uL (ref 0.0–0.4)
EOS: 2 %
HEMATOCRIT: 42.5 % (ref 37.5–51.0)
Hemoglobin: 15 g/dL (ref 12.6–17.7)
Immature Grans (Abs): 0 10*3/uL (ref 0.0–0.1)
Immature Granulocytes: 0 %
LYMPHS: 33 %
Lymphocytes Absolute: 1.5 10*3/uL (ref 0.7–3.1)
MCH: 30.5 pg (ref 26.6–33.0)
MCHC: 35.3 g/dL (ref 31.5–35.7)
MCV: 86 fL (ref 79–97)
Monocytes Absolute: 0.4 10*3/uL (ref 0.1–0.9)
Monocytes: 9 %
NEUTROS PCT: 56 %
Neutrophils Absolute: 2.6 10*3/uL (ref 1.4–7.0)
RBC: 4.92 x10E6/uL (ref 4.14–5.80)
RDW: 12.7 % (ref 12.3–15.4)
WBC: 4.6 10*3/uL (ref 3.4–10.8)

## 2015-02-11 LAB — BASIC METABOLIC PANEL
BUN / CREAT RATIO: 21 (ref 10–22)
BUN: 21 mg/dL (ref 8–27)
CO2: 22 mmol/L (ref 18–29)
CREATININE: 1 mg/dL (ref 0.76–1.27)
Calcium: 9 mg/dL (ref 8.6–10.2)
Chloride: 99 mmol/L (ref 97–106)
GFR calc Af Amer: 91 mL/min/{1.73_m2} (ref >59)
GFR calc non Af Amer: 79 mL/min/{1.73_m2} (ref >59)
GLUCOSE: 109 mg/dL — AB (ref 65–99)
Potassium: 4.1 mmol/L (ref 3.5–5.2)
Sodium: 140 mmol/L (ref 136–144)

## 2015-02-11 LAB — LIPID PANEL
CHOL/HDL RATIO: 2.1 ratio (ref 0.0–5.0)
Cholesterol, Total: 93 mg/dL — ABNORMAL LOW (ref 100–199)
HDL: 45 mg/dL (ref >39)
LDL CALC: 31 mg/dL (ref 0–99)
TRIGLYCERIDES: 85 mg/dL (ref 0–149)
VLDL Cholesterol Cal: 17 mg/dL (ref 5–40)

## 2015-02-11 LAB — HEMOGLOBIN A1C
Est. average glucose Bld gHb Est-mCnc: 163 mg/dL
Hgb A1c MFr Bld: 7.3 % — ABNORMAL HIGH (ref 4.8–5.6)

## 2015-02-11 LAB — CK: Total CK: 232 U/L — ABNORMAL HIGH (ref 24–204)

## 2015-02-14 ENCOUNTER — Ambulatory Visit (INDEPENDENT_AMBULATORY_CARE_PROVIDER_SITE_OTHER): Payer: Medicare Other | Admitting: Internal Medicine

## 2015-02-14 ENCOUNTER — Encounter (HOSPITAL_COMMUNITY): Payer: Self-pay

## 2015-02-14 ENCOUNTER — Encounter: Payer: Self-pay | Admitting: Internal Medicine

## 2015-02-14 VITALS — BP 130/90 | HR 61 | Temp 97.5°F | Resp 20 | Ht 68.0 in | Wt 180.2 lb

## 2015-02-14 DIAGNOSIS — I10 Essential (primary) hypertension: Secondary | ICD-10-CM | POA: Diagnosis not present

## 2015-02-14 DIAGNOSIS — R748 Abnormal levels of other serum enzymes: Secondary | ICD-10-CM

## 2015-02-14 DIAGNOSIS — R251 Tremor, unspecified: Secondary | ICD-10-CM | POA: Diagnosis not present

## 2015-02-14 DIAGNOSIS — I200 Unstable angina: Secondary | ICD-10-CM

## 2015-02-14 DIAGNOSIS — D509 Iron deficiency anemia, unspecified: Secondary | ICD-10-CM | POA: Diagnosis not present

## 2015-02-14 DIAGNOSIS — G2581 Restless legs syndrome: Secondary | ICD-10-CM | POA: Diagnosis not present

## 2015-02-14 DIAGNOSIS — E785 Hyperlipidemia, unspecified: Secondary | ICD-10-CM | POA: Diagnosis not present

## 2015-02-14 DIAGNOSIS — E1151 Type 2 diabetes mellitus with diabetic peripheral angiopathy without gangrene: Secondary | ICD-10-CM

## 2015-02-14 DIAGNOSIS — I25118 Atherosclerotic heart disease of native coronary artery with other forms of angina pectoris: Secondary | ICD-10-CM | POA: Diagnosis not present

## 2015-02-14 MED ORDER — CARBIDOPA-LEVODOPA 25-100 MG PO TABS: ORAL_TABLET | ORAL | Status: DC

## 2015-02-14 NOTE — Progress Notes (Addendum)
Patient ID: Kenneth Mcmanus Sr., male   DOB: 1950-04-29, 64 y.o.   MRN: [id number]    Facility  [facility]    Place of Service:   OFFICE    Allergies  Allergen Reactions  . Statins Other (See Comments)    Causes Restless Legs, rhabdomyalysis    Chief Complaint  Patient presents with  . Medical Management of Chronic Issues    4 month follow-up for DM, Hypertension    HPI:  Controlled type 2 DM with peripheral circulatory disorder (HCC) - controlled  Elevated CK - moderate elevation to 232. This is higher than when last checked. He is not using any statins. He is on Praluentl.  Essential hypertension - controlled  Hyperlipidemia - LDL has dropped very low on Praluent  Iron deficiency anemia - ran out of iron. Hemoglobin now normal and has been so for at least 3-4 months  Restless legs syndrome (RLS) - he feels the restless legs are getting worse. It can happen daytime and nighttime. Mirapex does seem to help some. He has not using the Sinemet previously recommended.  Coronary artery disease involving native coronary artery of native heart with other form of angina pectoris (HCC) - denies chest pain or palpitations  Tremor - patient has now acquired a significant tremor at rest. It is about 6 cps and is highly suggestive parkinsonism to me. Patient previously was using Sinemet for his restless leg syndrome. He also has seen a neurologist, DrMarland Kitchen Lurena Joiner Tat. He has not having any problems with falling or mobility. The tremor is eradicated by movement. He denies problems of feet sticking to the floor or retropulsion.    Medications: Patient's Medications  New Prescriptions   No medications on file  Previous Medications   ACETAMINOPHEN (TYLENOL) 500 MG TABLET    Take 1,000-1,500 mg by mouth every 6 (six) hours as needed for headache.   ALIROCUMAB 75 MG/ML SOPN    Inject 1 each into the skin. Every 2 weeks for cholesterol   AMBULATORY NON FORMULARY MEDICATION    One Touch Ultra Test  Strips Sig: Use to test blood sugar four times daily. Dx: E11.51, I10   ASPIRIN 81 MG EC TABLET    Take 1 tablet (81 mg total) by mouth daily.   BD PEN NEEDLE NANO U/F 32G X 4 MM MISC    USE ONE NEEDLE DAILY WITH LANTUS PEN   CARVEDILOL (COREG) 6.25 MG TABLET    Take 0.5 tablets (3.125 mg total) by mouth 2 (two) times daily with a meal.   FERROUS SULFATE 325 (65 FE) MG TABLET    One daily to treat anemia   GLIMEPIRIDE (AMARYL) 1 MG TABLET    Take 1 tablet (1 mg total) by mouth daily with breakfast. To control diabetes   GLUCOSE BLOOD (ONE TOUCH ULTRA TEST) TEST STRIP    Use to test blood sugar four times daily. Dx: E11.51   INSULIN GLARGINE (LANTUS) 100 UNIT/ML SOPN    Inject 20-26 Units into the skin 2 (two) times daily. 22-24 units in the morning and 26 units in the evening.   LANTUS SOLOSTAR 100 UNIT/ML SOLOSTAR PEN    INJECT 20 UNITS IN THE MORNING AND 24 UNITS IN THE EVENING TO CONTROL DIABETES   NITROGLYCERIN (NITROSTAT) 0.4 MG SL TABLET    Place 0.4 mg under the tongue every 5 (five) minutes as needed for chest pain.   PANTOPRAZOLE (PROTONIX) 40 MG TABLET    Take one tablet by mouth twice daily  for stomach   PRAMIPEXOLE (MIRAPEX) 1 MG TABLET    One twice daily to help restless legs   PRASUGREL (EFFIENT) 10 MG TABS TABLET    Take 1 tablet (10 mg total) by mouth daily.  Modified Medications   No medications on file  Discontinued Medications   No medications on file    Review of Systems  Constitutional: Negative for fever and fatigue.  HENT: Positive for hearing loss and voice change (Hoarseness which has persisted for several weeks. History of vocal cord polyp about 15 years ago.). Negative for congestion and ear pain.   Eyes: Negative.        Wears prescription lenses.  Respiratory: Negative for cough, choking, chest tightness, shortness of breath (with exertion) and wheezing.        Voice change. Dysphonia. 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  Cardiovascular: Negative for chest pain,  palpitations and leg swelling.  Gastrointestinal: Negative for nausea, vomiting, abdominal pain, diarrhea, constipation and abdominal distention.       Barrett's esophagus and reflux history  Endocrine: Negative for cold intolerance, heat intolerance, polydipsia, polyphagia and polyuria.       Diabetes under poor control.  Genitourinary: Negative.   Musculoskeletal: Positive for back pain, joint swelling, arthralgias and gait problem. Negative for myalgias, neck pain and neck stiffness.       Tender left sacroiliac joint area and at the ischium on palpation.  Skin: Negative.   Allergic/Immunologic: Negative.   Neurological: Positive for tremors (both upper and lower extremities), weakness, numbness (to bilateral feet, with  restless legs) and headaches. Negative for dizziness, seizures, syncope, facial asymmetry, speech difficulty and light-headedness.       Improvement in his restless legs  Hematological: Bruises/bleeds easily.       Iron deficiency anemia without obvious blood loss.  Psychiatric/Behavioral: The patient is nervous/anxious.     Filed Vitals:   02/14/15 1519  BP: 130/90  Pulse: 61  Temp: 97.5 F (36.4 C)  TempSrc: Oral  Resp: 20  Height:  (1.727 m)  Weight: 180 lb 3.2 oz (81.738 kg)  SpO2: 96%   Body mass index is 27.41 kg/(m^2).  Physical Exam  Constitutional: He is oriented to person, place, and time. He appears well-developed and well-nourished. No distress.  overweight  HENT:  Head: Normocephalic and atraumatic.  Right Ear: External ear normal.  Left Ear: External ear normal.  Nose: Nose normal.  Mouth/Throat: Oropharynx is clear and moist.  There is a hoarse quality to his voice as he speaks.  Eyes: Conjunctivae and EOM are normal. Pupils are equal, round, and reactive to light.  Neck: Normal range of motion. Neck supple. No JVD present. No tracheal deviation present. No thyromegaly present.  Cardiovascular: Normal rate, regular rhythm, normal  heart sounds and intact distal pulses.  Exam reveals no gallop and no friction rub.   No murmur heard. Pulmonary/Chest: Effort normal. No respiratory distress. He has no wheezes. He has no rales. He exhibits no tenderness.  Abdominal: Soft. Bowel sounds are normal. He exhibits no distension and no mass.  Musculoskeletal: He exhibits no edema or tenderness.  Bilateral shoulder discomfort with some restriction of movement especially at the left shoulder. Painful to raise left arm. Painful to lift items and to rotate at left shoulder. Has been seen by Dr. Thurston Hole. Chronic neck pains. Tender at insertion of the left Achilles tendon.  Lymphadenopathy:    He has no cervical adenopathy.  Neurological: He is alert and oriented to person,  place, and time. He has normal reflexes. No cranial nerve deficit. Coordination normal.  Bilateral restless legs. Bilateral upper and lower 6 cps tremor. Blink reflex felt to eradicate. There is no significant cogwheeling or rigidity.  Skin: Skin is warm and dry. No rash noted. No erythema. No pallor.  Psychiatric: He has a normal mood and affect. His behavior is normal. Judgment and thought content normal.    Labs reviewed: No flowsheet data found. Lab Results  Component Value Date   TSH 4.810* 09/19/2013   Lab Results  Component Value Date   BUN 21 02/10/2015   Lab Results  Component Value Date   HGBA1C 7.3* 02/10/2015   CK   232      02/10/2015  Assessment/Plan  1. Controlled type 2 DM with peripheral circulatory disorder [facility](HCC) Patient has not been as compliant with his diet has has been recommended in the past. He recognizes this. He believes he can do a better job. He would like to remain on his current dosage of insulin at this time.  2. Elevated CPK Modest elevation in CK  3. Essential hypertension Controlled  4. Hyperlipidemia Controlled with Praluent 5. Iron deficiency anemia Discontinue ferrous sulfate  6. Restless legs syndrome  (RLS) Continue Mirapex 1 mg in the morning - Add carbidopa-levodopa (SINEMET) 25-100 MG tablet; One each evening to help restless legs and tremor  Dispense: 90 tablet; Refill: 5  7. Coronary artery disease involving native coronary artery of native heart with other form of angina pectoris (HCC) Stable and asymptomatic  8. Tremor Advised patient of my concern that he may have parkinsonism. - carbidopa-levodopa (SINEMET) 25-100 MG tablet; One each evening to help restless legs and tremor  Dispense: 90 tablet; Refill: 5

## 2015-02-15 ENCOUNTER — Encounter (HOSPITAL_COMMUNITY): Payer: Self-pay

## 2015-02-15 DIAGNOSIS — R490 Dysphonia: Secondary | ICD-10-CM | POA: Diagnosis not present

## 2015-02-15 DIAGNOSIS — J385 Laryngeal spasm: Secondary | ICD-10-CM | POA: Diagnosis not present

## 2015-02-16 ENCOUNTER — Encounter (HOSPITAL_COMMUNITY): Payer: Self-pay

## 2015-02-17 NOTE — Progress Notes (Signed)
Thank you! I corrected the chart to reflect the correct drug.

## 2015-02-21 ENCOUNTER — Encounter (HOSPITAL_COMMUNITY): Payer: Self-pay

## 2015-02-22 ENCOUNTER — Encounter (HOSPITAL_COMMUNITY): Payer: Self-pay

## 2015-02-23 ENCOUNTER — Encounter (HOSPITAL_COMMUNITY): Payer: Self-pay

## 2015-02-28 ENCOUNTER — Encounter (HOSPITAL_COMMUNITY): Payer: Self-pay

## 2015-03-01 ENCOUNTER — Encounter (HOSPITAL_COMMUNITY): Payer: Self-pay

## 2015-03-02 ENCOUNTER — Encounter (HOSPITAL_COMMUNITY): Payer: Self-pay

## 2015-03-07 ENCOUNTER — Encounter (HOSPITAL_COMMUNITY): Payer: Self-pay

## 2015-03-08 ENCOUNTER — Encounter (HOSPITAL_COMMUNITY): Payer: Self-pay

## 2015-03-09 ENCOUNTER — Encounter (HOSPITAL_COMMUNITY): Payer: Self-pay

## 2015-03-14 ENCOUNTER — Encounter (HOSPITAL_COMMUNITY): Payer: Self-pay

## 2015-03-15 ENCOUNTER — Encounter (HOSPITAL_COMMUNITY): Payer: Self-pay

## 2015-03-16 ENCOUNTER — Encounter (HOSPITAL_COMMUNITY): Payer: Self-pay

## 2015-03-21 ENCOUNTER — Encounter (HOSPITAL_COMMUNITY): Payer: Self-pay

## 2015-03-22 ENCOUNTER — Encounter (HOSPITAL_COMMUNITY): Payer: Self-pay

## 2015-03-23 ENCOUNTER — Encounter (HOSPITAL_COMMUNITY): Payer: Self-pay

## 2015-03-28 ENCOUNTER — Encounter (HOSPITAL_COMMUNITY): Payer: Self-pay

## 2015-03-29 ENCOUNTER — Encounter (HOSPITAL_COMMUNITY): Payer: Self-pay

## 2015-03-30 ENCOUNTER — Encounter (HOSPITAL_COMMUNITY): Payer: Self-pay

## 2015-04-04 ENCOUNTER — Encounter (HOSPITAL_COMMUNITY): Payer: Self-pay

## 2015-04-05 ENCOUNTER — Encounter (HOSPITAL_COMMUNITY): Payer: Self-pay

## 2015-04-06 ENCOUNTER — Encounter (HOSPITAL_COMMUNITY): Payer: Self-pay

## 2015-04-11 ENCOUNTER — Encounter (HOSPITAL_COMMUNITY): Payer: Self-pay

## 2015-04-12 ENCOUNTER — Encounter (HOSPITAL_COMMUNITY): Payer: Self-pay

## 2015-04-13 ENCOUNTER — Encounter (HOSPITAL_COMMUNITY): Payer: Self-pay

## 2015-04-14 ENCOUNTER — Emergency Department (HOSPITAL_COMMUNITY)
Admission: EM | Admit: 2015-04-14 | Discharge: 2015-04-14 | Disposition: A | Discharge status: Home / Self Care | Payer: Medicare Other | Attending: Emergency Medicine | Admitting: Emergency Medicine

## 2015-04-14 ENCOUNTER — Emergency Department (HOSPITAL_COMMUNITY): Payer: Medicare Other

## 2015-04-14 ENCOUNTER — Encounter (HOSPITAL_COMMUNITY): Payer: Self-pay | Admitting: Nurse Practitioner

## 2015-04-14 DIAGNOSIS — I25119 Atherosclerotic heart disease of native coronary artery with unspecified angina pectoris: Secondary | ICD-10-CM | POA: Diagnosis not present

## 2015-04-14 DIAGNOSIS — R079 Chest pain, unspecified: Secondary | ICD-10-CM | POA: Diagnosis not present

## 2015-04-14 DIAGNOSIS — K219 Gastro-esophageal reflux disease without esophagitis: Secondary | ICD-10-CM | POA: Diagnosis not present

## 2015-04-14 DIAGNOSIS — Z9861 Coronary angioplasty status: Secondary | ICD-10-CM | POA: Diagnosis not present

## 2015-04-14 DIAGNOSIS — R011 Cardiac murmur, unspecified: Secondary | ICD-10-CM | POA: Diagnosis not present

## 2015-04-14 DIAGNOSIS — Z7982 Long term (current) use of aspirin: Secondary | ICD-10-CM | POA: Insufficient documentation

## 2015-04-14 DIAGNOSIS — E119 Type 2 diabetes mellitus without complications: Secondary | ICD-10-CM | POA: Diagnosis not present

## 2015-04-14 DIAGNOSIS — Z79899 Other long term (current) drug therapy: Secondary | ICD-10-CM | POA: Insufficient documentation

## 2015-04-14 DIAGNOSIS — Z8669 Personal history of other diseases of the nervous system and sense organs: Secondary | ICD-10-CM | POA: Diagnosis not present

## 2015-04-14 DIAGNOSIS — R0789 Other chest pain: Secondary | ICD-10-CM | POA: Diagnosis not present

## 2015-04-14 DIAGNOSIS — E089 Diabetes mellitus due to underlying condition without complications: Secondary | ICD-10-CM | POA: Diagnosis not present

## 2015-04-14 DIAGNOSIS — I2510 Atherosclerotic heart disease of native coronary artery without angina pectoris: Secondary | ICD-10-CM | POA: Diagnosis not present

## 2015-04-14 LAB — BASIC METABOLIC PANEL
ANION GAP: 10 (ref 5–15)
BUN: 16 mg/dL (ref 6–20)
CALCIUM: 9.5 mg/dL (ref 8.9–10.3)
CO2: 25 mmol/L (ref 22–32)
Chloride: 105 mmol/L (ref 101–111)
Creatinine, Ser: 1.24 mg/dL (ref 0.61–1.24)
GFR, EST NON AFRICAN AMERICAN: 59 mL/min — AB (ref >60)
Glucose, Bld: 137 mg/dL — ABNORMAL HIGH (ref 65–99)
POTASSIUM: 4.4 mmol/L (ref 3.5–5.1)
Sodium: 140 mmol/L (ref 135–145)

## 2015-04-14 LAB — I-STAT TROPONIN, ED: Troponin i, poc: 0 ng/mL (ref 0.00–0.08)

## 2015-04-14 LAB — CBC
HCT: 39.8 % (ref 39.0–52.0)
Hemoglobin: 13.5 g/dL (ref 13.0–17.0)
MCH: 29 pg (ref 26.0–34.0)
MCHC: 33.9 g/dL (ref 30.0–36.0)
MCV: 85.4 fL (ref 78.0–100.0)
PLATELETS: 120 10*3/uL — AB (ref 150–400)
RBC: 4.66 MIL/uL (ref 4.22–5.81)
RDW: 12.4 % (ref 11.5–15.5)
WBC: 4.7 10*3/uL (ref 4.0–10.5)

## 2015-04-14 MED ORDER — HYDROMORPHONE HCL 1 MG/ML IJ SOLN
0.5000 mg | Freq: Once | INTRAMUSCULAR | Status: AC
  Administered 2015-04-14: 0.5 mg via INTRAVENOUS
  Filled 2015-04-14: qty 1

## 2015-04-14 MED ORDER — HYDROCODONE-ACETAMINOPHEN 5-325 MG PO TABS: 1.0000 | ORAL_TABLET | ORAL | Status: DC | PRN

## 2015-04-14 NOTE — Discharge Instructions (Signed)
Nonspecific Chest Pain  °Chest pain can be caused by many different conditions. There is always a chance that your pain could be related to something serious, such as a heart attack or a blood clot in your lungs. Chest pain can also be caused by conditions that are not life-threatening. If you have chest pain, it is very important to follow up with your health care provider. °CAUSES  °Chest pain can be caused by: °· Heartburn. °· Pneumonia or bronchitis. °· Anxiety or stress. °· Inflammation around your heart (pericarditis) or lung (pleuritis or pleurisy). °· A blood clot in your lung. °· A collapsed lung (pneumothorax). It can develop suddenly on its own (spontaneous pneumothorax) or from trauma to the chest. °· Shingles infection (varicella-zoster virus). °· Heart attack. °· Damage to the bones, muscles, and cartilage that make up your chest wall. This can include: °¨ Bruised bones due to injury. °¨ Strained muscles or cartilage due to frequent or repeated coughing or overwork. °¨ Fracture to one or more ribs. °¨ Sore cartilage due to inflammation (costochondritis). °RISK FACTORS  °Risk factors for chest pain may include: °· Activities that increase your risk for trauma or injury to your chest. °· Respiratory infections or conditions that cause frequent coughing. °· Medical conditions or overeating that can cause heartburn. °· Heart disease or family history of heart disease. °· Conditions or health behaviors that increase your risk of developing a blood clot. °· Having had chicken pox (varicella zoster). °SIGNS AND SYMPTOMS °Chest pain can feel like: °· Burning or tingling on the surface of your chest or deep in your chest. °· Crushing, pressure, aching, or squeezing pain. °· Dull or sharp pain that is worse when you move, cough, or take a deep breath. °· Pain that is also felt in your back, neck, shoulder, or arm, or pain that spreads to any of these areas. °Your chest pain may come and go, or it may stay  constant. °DIAGNOSIS °Lab tests or other studies may be needed to find the cause of your pain. Your health care provider may have you take a test called an ambulatory ECG (electrocardiogram). An ECG records your heartbeat patterns at the time the test is performed. You may also have other tests, such as: °· Transthoracic echocardiogram (TTE). During echocardiography, sound waves are used to create a picture of all of the heart structures and to look at how blood flows through your heart. °· Transesophageal echocardiogram (TEE). This is a more advanced imaging test that obtains images from inside your body. It allows your health care provider to see your heart in finer detail. °· Cardiac monitoring. This allows your health care provider to monitor your heart rate and rhythm in real time. °· Holter monitor. This is a portable device that records your heartbeat and can help to diagnose abnormal heartbeats. It allows your health care provider to track your heart activity for several days, if needed. °· Stress tests. These can be done through exercise or by taking medicine that makes your heart beat more quickly. °· Blood tests. °· Imaging tests. °TREATMENT  °Your treatment depends on what is causing your chest pain. Treatment may include: °· Medicines. These may include: °¨ Acid blockers for heartburn. °¨ Anti-inflammatory medicine. °¨ Pain medicine for inflammatory conditions. °¨ Antibiotic medicine, if an infection is present. °¨ Medicines to dissolve blood clots. °¨ Medicines to treat coronary artery disease. °· Supportive care for conditions that do not require medicines. This may include: °¨ Resting. °¨ Applying heat   or cold packs to injured areas. °¨ Limiting activities until pain decreases. °HOME CARE INSTRUCTIONS °· If you were prescribed an antibiotic medicine, finish it all even if you start to feel better. °· Avoid any activities that bring on chest pain. °· Do not use any tobacco products, including  cigarettes, chewing tobacco, or electronic cigarettes. If you need help quitting, ask your health care provider. °· Do not drink alcohol. °· Take medicines only as directed by your health care provider. °· Keep all follow-up visits as directed by your health care provider. This is important. This includes any further testing if your chest pain does not go away. °· If heartburn is the cause for your chest pain, you may be told to keep your head raised (elevated) while sleeping. This reduces the chance that acid will go from your stomach into your esophagus. °· Make lifestyle changes as directed by your health care provider. These may include: °¨ Getting regular exercise. Ask your health care provider to suggest some activities that are safe for you. °¨ Eating a heart-healthy diet. A registered dietitian can help you to learn healthy eating options. °¨ Maintaining a healthy weight. °¨ Managing diabetes, if necessary. °¨ Reducing stress. °SEEK MEDICAL CARE IF: °· Your chest pain does not go away after treatment. °· You have a rash with blisters on your chest. °· You have a fever. °SEEK IMMEDIATE MEDICAL CARE IF:  °· Your chest pain is worse. °· You have an increasing cough, or you cough up blood. °· You have severe abdominal pain. °· You have severe weakness. °· You faint. °· You have chills. °· You have sudden, unexplained chest discomfort. °· You have sudden, unexplained discomfort in your arms, back, neck, or jaw. °· You have shortness of breath at any time. °· You suddenly start to sweat, or your skin gets clammy. °· You feel nauseous or you vomit. °· You suddenly feel light-headed or dizzy. °· Your heart begins to beat quickly, or it feels like it is skipping beats. °These symptoms may represent a serious problem that is an emergency. Do not wait to see if the symptoms will go away. Get medical help right away. Call your local emergency services (911 in the [place] Do not drive yourself to the hospital. °  °This  information is not intended to replace advice given to you by your health care provider. Make sure you discuss any questions you have with your health care provider. °  °Document Released: 11/28/2004 Document Revised: 03/11/2014 Document Reviewed: 09/24/2013 °Elsevier Interactive Patient Education ©2016 [employer] ° °

## 2015-04-14 NOTE — ED Provider Notes (Signed)
CSN: [id number]     Arrival date & time 04/14/15  1149 History   First MD Initiated Contact with Patient 04/14/15 1419     Chief Complaint  Patient presents with  . Chest Pain     (Consider location/radiation/quality/duration/timing/severity/associated sxs/prior Treatment) Patient is a 65 y.o. male presenting with chest pain.  Chest Pain Associated symptoms: no abdominal pain, no altered mental status, no back pain, no cough, no dizziness, no fever, no lower extremity edema, no nausea, no near-syncope, no palpitations, no shortness of breath, no syncope and not vomiting   Risk factors: coronary artery disease, diabetes mellitus, high cholesterol and male sex   Risk factors: no immobilization, not obese, no prior DVT/PE and no smoking      Kenneth Earwood Sr. is a 65 y.o. male with PMH significant for CAD s/p LHC with stenting s/p one year ago with who presents with sharp, right sided, constant chest pain that started approximately 8 AM today.  Denies SOB, dizziness, N/V, abdominal pain, urinary symptoms, unilateral leg swelling, hx of DVT/PE, immobilization/trauma.  He reports he took 2 SL NTG; however, found no relief.  No aggravating factors.  He was seen by Dr. [name] in the office this morning and sent to the ED for labs.     Past Medical History  Diagnosis Date  . Restless leg   . Cervicalgia   . Impotence of organic origin   . Barrett's esophagus     "we've been told that it's all gone; still take RX for GERD" (05/03/2014)  . Unspecified gastritis and gastroduodenitis with hemorrhage   . Hyperlipidemia   . Other malaise and fatigue   . Hypoglycemia, unspecified   . GERD (gastroesophageal reflux disease)   . Chronic bronchitis (HCC)     "sometimes 2-3 times/yr" (05/03/2014)  . H/O hiatal hernia   . Unstable angina pectoris (HCC) 04/08/2014    Coronary angiogram 05/03/2014:  Proximal LAD 3.0x12 mm Promus premier stent.  04/08/2014: Mid Cx 3.5 x 16 mm Promus DES.  03/29/2013: Mid LAD 2.75 x 38  mm Promus Premier drug-eluting stent, balloon angioplasty of D1 and distal LAD and stenting of distal RCA with 2.75 x 24 mm promos Premier drug-eluting stent 12/15/2012 widely patent.  Marland Kitchen CAD (coronary artery disease), native coronary artery     Coronary angiogram 05/03/2014:  Proximal LAD 3.0x12 mm Promus premier stent.  04/08/2014: Mid Cx 3.5 x 16 mm Promus DES.  03/29/2013: Mid LAD 2.75 x 38 mm Promus Premier drug-eluting stent, balloon angioplasty of D1 and distal LAD and stenting of distal RCA with 2.75 x 24 mm promos Premier drug-eluting stent 12/15/2012 widely patent.  . Complication of anesthesia     " I do not wake up very well "  . PONV (postoperative nausea and vomiting)   . Family history of adverse reaction to anesthesia     "son w/PONV"  . Heart murmur     "grew out of it"   . IDDM (insulin dependent diabetes mellitus) (HCC)   . Tension headache   . Headache     "weekly" (05/03/2014)   Past Surgical History  Procedure Laterality Date  . Knee surgery Right 1992;  2000    "calcium deposits removed" (12/15/2012)  . Incision and drainage abscess Left 05/2007    "groin"   . Left heart catheterization with coronary angiogram N/A 12/15/2012    Procedure: LEFT HEART CATHETERIZATION WITH CORONARY ANGIOGRAM;  Surgeon: Pamella Pert, MD;  Location: [facility] CATH LAB;  Service:  Cardiovascular;  Laterality: N/A;  . Left heart catheterization with coronary angiogram N/A 03/26/2013    Procedure: LEFT HEART CATHETERIZATION WITH CORONARY ANGIOGRAM;  Surgeon: Pamella Pert, MD;  Location: [facility] CATH LAB;  Service: Cardiovascular;  Laterality: N/A;  . Fractional flow reserve wire Right 03/26/2013    Procedure: FRACTIONAL FLOW RESERVE WIRE;  Surgeon: Pamella Pert, MD;  Location: [facility] CATH LAB;  Service: Cardiovascular;  Laterality: Right;  . Left heart catheterization with coronary angiogram N/A 04/08/2014    Procedure: LEFT HEART CATHETERIZATION WITH CORONARY ANGIOGRAM;  Surgeon: Micheline Chapman, MD;   Location: [facility] CATH LAB;  Service: Cardiovascular;  Laterality: N/A;  . Coronary angioplasty with stent placement  12/15/2012; 04/28/2014; 05/03/2014    "2; 1; 1"   . Umbilical hernia repair    . Hernia repair  2010    "umbilical"   . Percutaneous coronary stent intervention (pci-s) N/A 05/03/2014    Procedure: PERCUTANEOUS CORONARY STENT INTERVENTION (PCI-S);  Surgeon: Pamella Pert, MD;  Location: [facility] CATH LAB;  Service: Cardiovascular;  Laterality: N/A;  . Fractional flow reserve wire N/A 05/03/2014    Procedure: FRACTIONAL FLOW RESERVE WIRE;  Surgeon: Pamella Pert, MD;  Location: [facility] CATH LAB;  Service: Cardiovascular;  Laterality: N/A;   Family History  Problem Relation Age of Onset  . Adopted: Yes  . Family history unknown: Yes   Social History  Substance Use Topics  . Smoking status: Never Smoker   . Smokeless tobacco: Never Used  . Alcohol Use: No    Review of Systems  Constitutional: Negative for fever.  Respiratory: Negative for cough and shortness of breath.   Cardiovascular: Positive for chest pain. Negative for palpitations, syncope and near-syncope.  Gastrointestinal: Negative for nausea, vomiting and abdominal pain.  Genitourinary: Negative for dysuria, frequency and hematuria.  Musculoskeletal: Negative for back pain.  Neurological: Negative for dizziness.  All other systems reviewed and are negative.     Allergies  Statins  Home Medications   Prior to Admission medications   Medication Sig Start Date End Date Taking? Authorizing Provider  acetaminophen (TYLENOL) 500 MG tablet Take 1,000-1,500 mg by mouth every 6 (six) hours as needed for headache.   Yes Historical Provider, MD  Alirocumab 75 MG/ML SOPN Inject 1 each into the skin. Every 2 weeks for cholesterol   Yes Historical Provider, MD  AMBULATORY NON FORMULARY MEDICATION One Touch Ultra Test Strips Sig: Use to test blood sugar four times daily. Dx: E11.51, I10 05/23/14  Yes Kimber Relic, MD  aspirin  81 MG EC tablet Take 1 tablet (81 mg total) by mouth daily. 12/16/12  Yes [name], MD  BD PEN NEEDLE NANO U/F 32G X 4 MM MISC USE ONE NEEDLE DAILY WITH LANTUS PEN 08/02/14  Yes [name], NP  carbidopa-levodopa (SINEMET) 25-100 MG tablet One each evening to help restless legs and tremor Patient taking differently: Take 1 tablet by mouth daily as needed (RLS).  02/14/15  Yes Kimber Relic, MD  carvedilol (COREG) 6.25 MG tablet Take 0.5 tablets (3.125 mg total) by mouth 2 (two) times daily with a meal. 01/16/15  Yes Kimber Relic, MD  glimepiride (AMARYL) 1 MG tablet Take 1 tablet (1 mg total) by mouth daily with breakfast. To control diabetes Patient taking differently: Take 1 mg by mouth daily as needed. To control diabetes 09/28/14  Yes Kimber Relic, MD  glucose blood (ONE TOUCH ULTRA TEST) test strip Use to test blood sugar four  times daily. Dx: E11.51 01/17/15  Yes [name], NP  LANTUS SOLOSTAR 100 UNIT/ML Solostar Pen INJECT 20 UNITS IN THE MORNING AND 24 UNITS IN THE EVENING TO CONTROL DIABETES Patient taking differently: INJECT 20 UNITS IN THE MORNING AND 26 UNITS IN THE EVENING TO CONTROL DIABETES 05/23/14  Yes Kimber Relic, MD  nitroGLYCERIN (NITROSTAT) 0.4 MG SL tablet Place 0.4 mg under the tongue every 5 (five) minutes as needed for chest pain.   Yes Historical Provider, MD  pantoprazole (PROTONIX) 40 MG tablet Take one tablet by mouth twice daily for stomach 10/25/14  Yes Kimber Relic, MD  pramipexole (MIRAPEX) 1 MG tablet One twice daily to help restless legs Patient taking differently: One tablet once daily to help restless legs 07/06/14  Yes Kimber Relic, MD  prasugrel (EFFIENT) 10 MG TABS tablet Take 1 tablet (10 mg total) by mouth daily. 12/16/12  Yes [name], MD  HYDROcodone-acetaminophen (NORCO/VICODIN) 5-325 MG tablet Take 1 tablet by mouth every 4 (four) hours as needed. 04/14/15   Kenneth Jaskowiak, PA-C   BP 139/86 mmHg  Pulse 60  Temp(Src) 97.6 F (36.4 C)  (Oral)  Resp 13  SpO2 99% Physical Exam  Constitutional: He is oriented to person, place, and time. He appears well-developed and well-nourished.  Non-toxic appearance. He does not have a sickly appearance. He does not appear ill.  HENT:  Head: Normocephalic and atraumatic.  Mouth/Throat: Oropharynx is clear and moist.  Eyes: Conjunctivae are normal. Pupils are equal, round, and reactive to light.  Neck: Normal range of motion. Neck supple.  Cardiovascular: Normal rate, regular rhythm and normal heart sounds.   No murmur heard. Pulmonary/Chest: Effort normal and breath sounds normal. No accessory muscle usage or stridor. No respiratory distress. He has no wheezes. He has no rhonchi. He has no rales.  Abdominal: Soft. Bowel sounds are normal. He exhibits no distension. There is no tenderness.  Musculoskeletal: Normal range of motion.  Lymphadenopathy:    He has no cervical adenopathy.  Neurological: He is alert and oriented to person, place, and time.  Speech clear without dysarthria.  Skin: Skin is warm and dry.  Psychiatric: He has a normal mood and affect. His behavior is normal.    ED Course  Procedures (including critical care time) Labs Review Labs Reviewed  BASIC METABOLIC PANEL - Abnormal; Notable for the following:    Glucose, Bld 137 (*)    GFR calc non Af Amer 59 (*)    All other components within normal limits  CBC - Abnormal; Notable for the following:    Platelets 120 (*)    All other components within normal limits  I-STAT TROPOININ, ED    Imaging Review Dg Chest 2 View  04/14/2015  CLINICAL DATA:  Right-sided chest pain for 1 day EXAM: CHEST  2 VIEW COMPARISON:  April 30, 2013 FINDINGS: Lungs are clear. Heart size and pulmonary vascularity are normal. No adenopathy. There is a stent in the left anterior descending coronary artery region. No bone lesions. IMPRESSION: Coronary artery stent present.  No edema or consolidation. Electronically Signed   By: Bretta Bang III M.D.   On: 04/14/2015 12:39   I have personally reviewed and evaluated these images and lab results as part of my medical decision-making.   EKG Interpretation None      MDM   Final diagnoses:  Atypical chest pain    Patient with CAD s/p stenting presents with CP since 8 AM this  morning.  Seen by Dr. [name] in the office and sent to ED for lab work.  No SOB, cough, or fever.  No hx of DVT/PE, recent immobilization or trauma, or unilateral leg swelling.  Not pleuritic in nature.  VSS, NAD.  Doubt PE.  On exam, heart RRR, lungs CTAB, abdomen soft and benign.  EKG shows NSR, no acute changes.  Troponin 0.00.  CBC and BMP unremarkable.  CXR remarkable for coronary artery stent, no edema or consolidation.  Spoke with Dr. [name] and he feels it to be more musculoskeletal vs cardiac pain.  Plan to treat pain with dilaudid and reassess.  If improvement, plan to discharge with short course of narcotics and follow up on Monday in the office.  No indication for delta troponin given constant symptoms since 8 AM. If pain, not improved with dilaudid will admit for ACS rule out. Case has been discussed with Dr. Anitra Lauth who agrees with the above plan for discharge.      Kenneth Fowler, PA-C 04/14/15 1612  Gwyneth Sprout, MD 04/17/15 [phone]

## 2015-04-14 NOTE — ED Notes (Signed)
Pt c/o R sided CP onset while sitting drinking coffee this morning, constant and increasing in severity since onset. He went to cardiology office and they sent him here for further cardiac evaluation and lab work. He describes as a sharp pain. Nothing increases or relieves pain. He tried nitro with no change in pain. He is A&Ox4, resp e/u

## 2015-04-17 DIAGNOSIS — I2510 Atherosclerotic heart disease of native coronary artery without angina pectoris: Secondary | ICD-10-CM | POA: Diagnosis not present

## 2015-04-17 DIAGNOSIS — R0789 Other chest pain: Secondary | ICD-10-CM | POA: Diagnosis not present

## 2015-04-17 DIAGNOSIS — Z9861 Coronary angioplasty status: Secondary | ICD-10-CM | POA: Diagnosis not present

## 2015-04-17 DIAGNOSIS — E089 Diabetes mellitus due to underlying condition without complications: Secondary | ICD-10-CM | POA: Diagnosis not present

## 2015-04-18 ENCOUNTER — Encounter (HOSPITAL_COMMUNITY): Payer: Self-pay

## 2015-04-18 ENCOUNTER — Ambulatory Visit: Payer: Medicare Other | Admitting: Internal Medicine

## 2015-04-19 ENCOUNTER — Encounter (HOSPITAL_COMMUNITY): Payer: Self-pay

## 2015-04-20 ENCOUNTER — Encounter (HOSPITAL_COMMUNITY): Payer: Self-pay

## 2015-04-25 ENCOUNTER — Encounter (HOSPITAL_COMMUNITY): Payer: Self-pay

## 2015-04-26 ENCOUNTER — Encounter (HOSPITAL_COMMUNITY): Payer: Self-pay

## 2015-04-26 ENCOUNTER — Encounter: Payer: Self-pay | Admitting: Internal Medicine

## 2015-04-26 ENCOUNTER — Ambulatory Visit (INDEPENDENT_AMBULATORY_CARE_PROVIDER_SITE_OTHER): Payer: Medicare Other | Admitting: Internal Medicine

## 2015-04-26 VITALS — BP 122/90 | HR 65 | Temp 97.6°F | Resp 18 | Ht 68.0 in | Wt 184.8 lb

## 2015-04-26 DIAGNOSIS — M722 Plantar fascial fibromatosis: Secondary | ICD-10-CM | POA: Insufficient documentation

## 2015-04-26 DIAGNOSIS — G2581 Restless legs syndrome: Secondary | ICD-10-CM

## 2015-04-26 DIAGNOSIS — I10 Essential (primary) hypertension: Secondary | ICD-10-CM

## 2015-04-26 DIAGNOSIS — E1151 Type 2 diabetes mellitus with diabetic peripheral angiopathy without gangrene: Secondary | ICD-10-CM

## 2015-04-26 DIAGNOSIS — R079 Chest pain, unspecified: Secondary | ICD-10-CM | POA: Diagnosis not present

## 2015-04-26 DIAGNOSIS — E785 Hyperlipidemia, unspecified: Secondary | ICD-10-CM

## 2015-04-26 MED ORDER — CLONAZEPAM 0.5 MG PO TABS: ORAL_TABLET | ORAL | Status: DC

## 2015-04-26 NOTE — Progress Notes (Signed)
Patient ID: Kenneth Vannice Sr., male   DOB: November 24, 1950, 65 y.o.   MRN: [id number]    Facility  [facility]    Place of Service:   OFFICE    Allergies  Allergen Reactions  . Statins Other (See Comments)    Causes Restless Legs, rhabdomyalysis    Chief Complaint  Patient presents with  . Medical Management of Chronic Issues    2 month follow-up forroutine visit  . OTHER    Wife in room with patient    HPI:   Chest pain, unspecified chest pain type Seen in the emergency room 04/14/2015 for chest pain. Initially seen at Dr. Irven Shelling office. Troponin was negative. Patient was sent home with diagnosis musculoskeletal chest wall pains. Controlled type 2 DM with peripheral circulatory disorder ([facility]) Other lab during ER visit included basic metabolic panel not that was normal except for glucose 137. CBC was normal except for platelet count of 120,000.  Essential hypertension - controlled  Hyperlipidemia - continues with alirocumab injections for cholesterol. No worsening of restless legs after he receives these. Patient thinks he is tolerating the shots really well.  Restless legs syndrome (RLS) - this is the major issue today. Restless legs do not seem to be responding to Mirapex as stated in the past. He says that Sinemet was never any help to him. He has seen a neurologist. He is willing to try something different.  Plantar fasciitis - burning sensation just anterior to the heel and sole of the foot. Intermittent intensity.   Medications: Patient's Medications  New Prescriptions   No medications on file  Previous Medications   ACETAMINOPHEN (TYLENOL) 500 MG TABLET    Take 1,000-1,500 mg by mouth every 6 (six) hours as needed for headache.   ALIROCUMAB 75 MG/ML SOPN    Inject 1 each into the skin. Every 2 weeks for cholesterol   AMBULATORY NON FORMULARY MEDICATION    One Touch Ultra Test Strips Sig: Use to test blood sugar four times daily. Dx: E11.51, I10   ASPIRIN 81 MG EC TABLET    Take  1 tablet (81 mg total) by mouth daily.   BD PEN NEEDLE NANO U/F 32G X 4 MM MISC    USE ONE NEEDLE DAILY WITH LANTUS PEN   CARVEDILOL (COREG) 6.25 MG TABLET    Take 0.5 tablets (3.125 mg total) by mouth 2 (two) times daily with a meal.   GLIMEPIRIDE (AMARYL) 1 MG TABLET    Take 1 tablet (1 mg total) by mouth daily with breakfast. To control diabetes   GLUCOSE BLOOD (ONE TOUCH ULTRA TEST) TEST STRIP    Use to test blood sugar four times daily. Dx: E11.51   HYDROCODONE-ACETAMINOPHEN (NORCO/VICODIN) 5-325 MG TABLET    Take 1 tablet by mouth every 4 (four) hours as needed.   LANTUS SOLOSTAR 100 UNIT/ML SOLOSTAR PEN    INJECT 20 UNITS IN THE MORNING AND 24 UNITS IN THE EVENING TO CONTROL DIABETES   NITROGLYCERIN (NITROSTAT) 0.4 MG SL TABLET    Place 0.4 mg under the tongue every 5 (five) minutes as needed for chest pain.   PANTOPRAZOLE (PROTONIX) 40 MG TABLET    Take one tablet by mouth twice daily for stomach   PRAMIPEXOLE (MIRAPEX) 1 MG TABLET    One twice daily to help restless legs   PRASUGREL (EFFIENT) 10 MG TABS TABLET    Take 1 tablet (10 mg total) by mouth daily.  Modified Medications   No medications on file  Discontinued  Medications   CARBIDOPA-LEVODOPA (SINEMET) 25-100 MG TABLET    One each evening to help restless legs and tremor    Review of Systems  Constitutional: Negative for fever and fatigue.  HENT: Positive for hearing loss and voice change (Hoarseness which has persisted for several weeks. History of vocal cord polyp about 15 years ago.). Negative for congestion and ear pain.   Eyes: Negative.        Wears prescription lenses.  Respiratory: Negative for cough, choking, chest tightness, shortness of breath (with exertion) and wheezing.        Voice change. Dysphonia. 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  Cardiovascular: Negative for chest pain, palpitations and leg swelling.  Gastrointestinal: Negative for nausea, vomiting, abdominal pain, diarrhea, constipation and abdominal  distention.       Barrett's esophagus and reflux history  Endocrine: Negative for cold intolerance, heat intolerance, polydipsia, polyphagia and polyuria.       Diabetes under poor control.  Genitourinary: Negative.   Musculoskeletal: Positive for back pain, joint swelling, arthralgias and gait problem. Negative for myalgias, neck pain and neck stiffness.       Tender left sacroiliac joint area and at the ischium on palpation.  Skin: Negative.   Allergic/Immunologic: Negative.   Neurological: Positive for tremors (both upper and lower extremities), weakness, numbness (to bilateral feet, with  restless legs) and headaches. Negative for dizziness, seizures, syncope, facial asymmetry, speech difficulty and light-headedness.       Improvement in his restless legs  Hematological: Bruises/bleeds easily.       Iron deficiency anemia without obvious blood loss.  Psychiatric/Behavioral: The patient is nervous/anxious.     Filed Vitals:   04/26/15 1250  BP: 122/90  Pulse: 65  Temp: 97.6 F (36.4 C)  TempSrc: Oral  Resp: 18  Height: 5' 8"  (1.727 m)  Weight: 184 lb 12.8 oz (83.825 kg)  SpO2: 95%   Body mass index is 28.11 kg/(m^2). Filed Weights   04/26/15 1250  Weight: 184 lb 12.8 oz (83.825 kg)     Physical Exam  Constitutional: He is oriented to person, place, and time. He appears well-developed and well-nourished. No distress.  overweight  HENT:  Head: Normocephalic and atraumatic.  Right Ear: External ear normal.  Left Ear: External ear normal.  Nose: Nose normal.  Mouth/Throat: Oropharynx is clear and moist.  There is a hoarse quality to his voice as he speaks.  Eyes: Conjunctivae and EOM are normal. Pupils are equal, round, and reactive to light.  Neck: Normal range of motion. Neck supple. No JVD present. No tracheal deviation present. No thyromegaly present.  Cardiovascular: Normal rate, regular rhythm, normal heart sounds and intact distal pulses.  Exam reveals no gallop  and no friction rub.   No murmur heard. Pulmonary/Chest: Effort normal. No respiratory distress. He has no wheezes. He has no rales. He exhibits no tenderness.  Abdominal: Soft. Bowel sounds are normal. He exhibits no distension and no mass.  Musculoskeletal: He exhibits no edema or tenderness.  Bilateral shoulder discomfort with some restriction of movement especially at the left shoulder. Painful to raise left arm. Painful to lift items and to rotate at left shoulder. Has been seen by Dr. Noemi Chapel. Chronic neck pains. Tender at insertion of the left Achilles tendon. Tender anterior to the left heel on the sole of the foot.  Lymphadenopathy:    He has no cervical adenopathy.  Neurological: He is alert and oriented to person, place, and time. He has normal reflexes. No  cranial nerve deficit. Coordination normal.  Bilateral restless legs. Bilateral upper and lower 6 cps tremor. Blink reflex felt to eradicate. There is no significant cogwheeling or rigidity.  Skin: Skin is warm and dry. No rash noted. No erythema. No pallor.  Psychiatric: He has a normal mood and affect. His behavior is normal. Judgment and thought content normal.    Labs reviewed: Lab Summary Latest Ref Rng 04/14/2015  Hemoglobin 13.0 - 17.0 g/dL 13.5  Hematocrit 39.0 - 52.0 % 39.8  White count 4.0 - 10.5 K/uL 4.7  Platelet count 150 - 400 K/uL 120(L)  Sodium 135 - 145 mmol/L 140  Potassium 3.5 - 5.1 mmol/L 4.4  Calcium 8.9 - 10.3 mg/dL 9.5  Phosphorus - (None)  Creatinine 0.61 - 1.24 mg/dL 1.24  AST - (None)  Alk Phos - (None)  Bilirubin - (None)  Glucose 65 - 99 mg/dL 137(H)  Cholesterol - (None)  HDL cholesterol - (None)  Triglycerides - (None)  LDL Direct - (None)  LDL Calc - (None)  Total protein - (None)  Albumin - (None)   Lab Results  Component Value Date   TSH 4.810* 09/19/2013   Lab Results  Component Value Date   BUN 16 04/14/2015   BUN 21 02/10/2015   BUN 17 10/19/2014   Lab Results    Component Value Date   HGBA1C 7.3* 02/10/2015   HGBA1C 6.9* 10/19/2014   HGBA1C 6.7* 07/04/2014    Assessment/Plan  1. Chest pain, unspecified chest pain type Resolved. Patient had no further discomfort after he returned home from the emergency room.  2. Controlled type 2 DM with peripheral circulatory disorder (H[facility]) Continue current medications - Hemoglobin A1c; Future - Comprehensive metabolic panel; Future  3. Essential hypertension Continue current medication - Comprehensive metabolic panel; Future  4. Hyperlipidemia Continue current medication - Lipid panel; Future - CK - CK  5. Restless legs syndrome (RLS) Add clonazepam 0.5 mg up to 4 times daily as needed to control restless legs.

## 2015-04-27 ENCOUNTER — Other Ambulatory Visit: Payer: Self-pay

## 2015-04-27 ENCOUNTER — Encounter (HOSPITAL_COMMUNITY): Payer: Self-pay

## 2015-04-27 DIAGNOSIS — G2581 Restless legs syndrome: Secondary | ICD-10-CM

## 2015-04-27 MED ORDER — PRAMIPEXOLE DIHYDROCHLORIDE 1 MG PO TABS: ORAL_TABLET | ORAL | Status: DC

## 2015-04-27 MED ORDER — CARVEDILOL 6.25 MG PO TABS: 3.1250 mg | ORAL_TABLET | Freq: Two times a day (BID) | ORAL | Status: DC

## 2015-04-27 MED ORDER — PANTOPRAZOLE SODIUM 40 MG PO TBEC: DELAYED_RELEASE_TABLET | ORAL | Status: DC

## 2015-04-27 MED ORDER — GLIMEPIRIDE 1 MG PO TABS: 1.0000 mg | ORAL_TABLET | Freq: Every day | ORAL | Status: DC

## 2015-04-27 NOTE — Telephone Encounter (Signed)
Refill request received from [facility] for Pramipexole 1 mg tablet.   Refill sent to [facility].

## 2015-05-01 ENCOUNTER — Other Ambulatory Visit: Payer: Self-pay | Admitting: *Deleted

## 2015-05-01 DIAGNOSIS — G2581 Restless legs syndrome: Secondary | ICD-10-CM

## 2015-05-01 MED ORDER — PRAMIPEXOLE DIHYDROCHLORIDE 1 MG PO TABS: ORAL_TABLET | ORAL | Status: DC

## 2015-05-01 MED ORDER — CARVEDILOL 6.25 MG PO TABS: 3.1250 mg | ORAL_TABLET | Freq: Two times a day (BID) | ORAL | Status: DC

## 2015-05-01 MED ORDER — GLIMEPIRIDE 1 MG PO TABS: 1.0000 mg | ORAL_TABLET | Freq: Every day | ORAL | Status: DC

## 2015-05-01 MED ORDER — PANTOPRAZOLE SODIUM 40 MG PO TBEC: DELAYED_RELEASE_TABLET | ORAL | Status: DC

## 2015-05-01 NOTE — Telephone Encounter (Signed)
[facility] 

## 2015-05-02 ENCOUNTER — Encounter (HOSPITAL_COMMUNITY): Payer: Self-pay

## 2015-05-03 ENCOUNTER — Other Ambulatory Visit: Payer: Self-pay | Admitting: Internal Medicine

## 2015-05-03 ENCOUNTER — Encounter (HOSPITAL_COMMUNITY): Payer: Self-pay

## 2015-05-04 ENCOUNTER — Encounter (HOSPITAL_COMMUNITY): Payer: Self-pay

## 2015-05-04 ENCOUNTER — Other Ambulatory Visit: Payer: Self-pay | Admitting: Internal Medicine

## 2015-05-04 DIAGNOSIS — R748 Abnormal levels of other serum enzymes: Secondary | ICD-10-CM

## 2015-05-09 ENCOUNTER — Encounter (HOSPITAL_COMMUNITY): Payer: Self-pay

## 2015-05-10 ENCOUNTER — Encounter (HOSPITAL_COMMUNITY): Payer: Self-pay

## 2015-05-11 ENCOUNTER — Encounter (HOSPITAL_COMMUNITY): Payer: Self-pay

## 2015-05-13 ENCOUNTER — Other Ambulatory Visit: Payer: Self-pay | Admitting: Internal Medicine

## 2015-05-15 ENCOUNTER — Other Ambulatory Visit: Payer: Self-pay | Admitting: *Deleted

## 2015-05-15 DIAGNOSIS — Z789 Other specified health status: Secondary | ICD-10-CM | POA: Diagnosis not present

## 2015-05-15 DIAGNOSIS — Z9861 Coronary angioplasty status: Secondary | ICD-10-CM | POA: Diagnosis not present

## 2015-05-15 DIAGNOSIS — I2510 Atherosclerotic heart disease of native coronary artery without angina pectoris: Secondary | ICD-10-CM | POA: Diagnosis not present

## 2015-05-15 DIAGNOSIS — E782 Mixed hyperlipidemia: Secondary | ICD-10-CM | POA: Diagnosis not present

## 2015-05-15 MED ORDER — GLIMEPIRIDE 1 MG PO TABS: 1.0000 mg | ORAL_TABLET | Freq: Every day | ORAL | Status: DC

## 2015-05-15 MED ORDER — PANTOPRAZOLE SODIUM 40 MG PO TBEC: DELAYED_RELEASE_TABLET | ORAL | Status: DC

## 2015-05-15 MED ORDER — CARVEDILOL 6.25 MG PO TABS: 3.1250 mg | ORAL_TABLET | Freq: Two times a day (BID) | ORAL | Status: DC

## 2015-05-15 NOTE — Telephone Encounter (Signed)
[facility] 

## 2015-05-16 ENCOUNTER — Encounter (HOSPITAL_COMMUNITY): Payer: Self-pay

## 2015-05-17 ENCOUNTER — Encounter (HOSPITAL_COMMUNITY): Payer: Self-pay

## 2015-05-17 DIAGNOSIS — J385 Laryngeal spasm: Secondary | ICD-10-CM | POA: Diagnosis not present

## 2015-05-17 DIAGNOSIS — R490 Dysphonia: Secondary | ICD-10-CM | POA: Diagnosis not present

## 2015-05-18 ENCOUNTER — Encounter (HOSPITAL_COMMUNITY): Payer: Self-pay

## 2015-05-21 ENCOUNTER — Other Ambulatory Visit: Payer: Self-pay | Admitting: Internal Medicine

## 2015-05-23 ENCOUNTER — Encounter (HOSPITAL_COMMUNITY): Payer: Self-pay

## 2015-05-24 ENCOUNTER — Encounter (HOSPITAL_COMMUNITY): Payer: Self-pay

## 2015-05-25 ENCOUNTER — Encounter (HOSPITAL_COMMUNITY): Payer: Self-pay

## 2015-05-29 ENCOUNTER — Other Ambulatory Visit: Payer: Self-pay | Admitting: Internal Medicine

## 2015-05-30 ENCOUNTER — Encounter (HOSPITAL_COMMUNITY): Payer: Self-pay

## 2015-05-31 ENCOUNTER — Encounter (HOSPITAL_COMMUNITY): Payer: Self-pay

## 2015-06-01 ENCOUNTER — Encounter (HOSPITAL_COMMUNITY): Payer: Self-pay

## 2015-06-06 ENCOUNTER — Encounter (HOSPITAL_COMMUNITY): Payer: Self-pay

## 2015-06-07 ENCOUNTER — Encounter (HOSPITAL_COMMUNITY): Payer: Self-pay

## 2015-06-07 DIAGNOSIS — R490 Dysphonia: Secondary | ICD-10-CM | POA: Diagnosis not present

## 2015-06-07 DIAGNOSIS — J385 Laryngeal spasm: Secondary | ICD-10-CM | POA: Diagnosis not present

## 2015-06-08 ENCOUNTER — Encounter (HOSPITAL_COMMUNITY): Payer: Self-pay

## 2015-06-13 ENCOUNTER — Encounter (HOSPITAL_COMMUNITY): Payer: Self-pay

## 2015-06-14 ENCOUNTER — Encounter (HOSPITAL_COMMUNITY): Payer: Self-pay

## 2015-06-15 ENCOUNTER — Encounter (HOSPITAL_COMMUNITY): Payer: Self-pay

## 2015-06-20 ENCOUNTER — Encounter (HOSPITAL_COMMUNITY): Payer: Self-pay

## 2015-06-21 ENCOUNTER — Encounter (HOSPITAL_COMMUNITY): Payer: Self-pay

## 2015-06-22 ENCOUNTER — Encounter (HOSPITAL_COMMUNITY): Payer: Self-pay

## 2015-06-27 ENCOUNTER — Encounter (HOSPITAL_COMMUNITY): Payer: Self-pay

## 2015-06-28 ENCOUNTER — Encounter (HOSPITAL_COMMUNITY): Payer: Self-pay

## 2015-06-29 ENCOUNTER — Encounter (HOSPITAL_COMMUNITY): Payer: Self-pay

## 2015-07-03 ENCOUNTER — Other Ambulatory Visit: Payer: Medicare Other

## 2015-07-03 DIAGNOSIS — I10 Essential (primary) hypertension: Secondary | ICD-10-CM

## 2015-07-03 DIAGNOSIS — R748 Abnormal levels of other serum enzymes: Secondary | ICD-10-CM | POA: Diagnosis not present

## 2015-07-03 DIAGNOSIS — E1151 Type 2 diabetes mellitus with diabetic peripheral angiopathy without gangrene: Secondary | ICD-10-CM | POA: Diagnosis not present

## 2015-07-03 DIAGNOSIS — E785 Hyperlipidemia, unspecified: Secondary | ICD-10-CM

## 2015-07-04 LAB — COMPREHENSIVE METABOLIC PANEL
ALBUMIN: 4.2 g/dL (ref 3.6–4.8)
ALK PHOS: 55 IU/L (ref 39–117)
ALT: 25 IU/L (ref 0–44)
AST: 16 IU/L (ref 0–40)
Albumin/Globulin Ratio: 1.8 (ref 1.2–2.2)
BUN / CREAT RATIO: 15 (ref 10–24)
BUN: 16 mg/dL (ref 8–27)
Bilirubin Total: 0.3 mg/dL (ref 0.0–1.2)
CALCIUM: 9.5 mg/dL (ref 8.6–10.2)
CO2: 24 mmol/L (ref 18–29)
CREATININE: 1.07 mg/dL (ref 0.76–1.27)
Chloride: 101 mmol/L (ref 96–106)
GFR calc Af Amer: 84 mL/min/{1.73_m2} (ref >59)
GFR, EST NON AFRICAN AMERICAN: 72 mL/min/{1.73_m2} (ref >59)
GLOBULIN, TOTAL: 2.4 g/dL (ref 1.5–4.5)
GLUCOSE: 147 mg/dL — AB (ref 65–99)
Potassium: 4.8 mmol/L (ref 3.5–5.2)
SODIUM: 143 mmol/L (ref 134–144)
Total Protein: 6.6 g/dL (ref 6.0–8.5)

## 2015-07-04 LAB — LIPID PANEL
CHOL/HDL RATIO: 2.3 ratio (ref 0.0–5.0)
Cholesterol, Total: 86 mg/dL — ABNORMAL LOW (ref 100–199)
HDL: 37 mg/dL — ABNORMAL LOW (ref >39)
LDL Calculated: 19 mg/dL (ref 0–99)
Triglycerides: 149 mg/dL (ref 0–149)
VLDL CHOLESTEROL CAL: 30 mg/dL (ref 5–40)

## 2015-07-04 LAB — HEMOGLOBIN A1C
Est. average glucose Bld gHb Est-mCnc: 166 mg/dL
Hgb A1c MFr Bld: 7.4 % — ABNORMAL HIGH (ref 4.8–5.6)

## 2015-07-04 LAB — CK: CK TOTAL: 125 U/L (ref 24–204)

## 2015-07-05 ENCOUNTER — Ambulatory Visit (INDEPENDENT_AMBULATORY_CARE_PROVIDER_SITE_OTHER): Payer: Medicare Other | Admitting: Internal Medicine

## 2015-07-05 ENCOUNTER — Encounter: Payer: Self-pay | Admitting: Internal Medicine

## 2015-07-05 VITALS — BP 124/86 | HR 65 | Temp 97.6°F | Ht 68.0 in | Wt 186.0 lb

## 2015-07-05 DIAGNOSIS — R7989 Other specified abnormal findings of blood chemistry: Secondary | ICD-10-CM | POA: Insufficient documentation

## 2015-07-05 DIAGNOSIS — G2581 Restless legs syndrome: Secondary | ICD-10-CM | POA: Diagnosis not present

## 2015-07-05 DIAGNOSIS — R0602 Shortness of breath: Secondary | ICD-10-CM

## 2015-07-05 DIAGNOSIS — E785 Hyperlipidemia, unspecified: Secondary | ICD-10-CM | POA: Diagnosis not present

## 2015-07-05 DIAGNOSIS — R499 Unspecified voice and resonance disorder: Secondary | ICD-10-CM | POA: Diagnosis not present

## 2015-07-05 DIAGNOSIS — M7660 Achilles tendinitis, unspecified leg: Secondary | ICD-10-CM

## 2015-07-05 DIAGNOSIS — M67879 Other specified disorders of synovium and tendon, unspecified ankle and foot: Secondary | ICD-10-CM

## 2015-07-05 DIAGNOSIS — R748 Abnormal levels of other serum enzymes: Secondary | ICD-10-CM

## 2015-07-05 DIAGNOSIS — E1151 Type 2 diabetes mellitus with diabetic peripheral angiopathy without gangrene: Secondary | ICD-10-CM | POA: Diagnosis not present

## 2015-07-05 DIAGNOSIS — I10 Essential (primary) hypertension: Secondary | ICD-10-CM

## 2015-07-05 DIAGNOSIS — R946 Abnormal results of thyroid function studies: Secondary | ICD-10-CM

## 2015-07-05 MED ORDER — GABAPENTIN 100 MG PO CAPS: ORAL_CAPSULE | ORAL | Status: DC

## 2015-07-05 NOTE — Progress Notes (Signed)
Patient ID: Kenneth Toral Sr., male   DOB: [birth year], 65 y.o.   MRN: [id number]    Facility  [facility]    Place of Service:   OFFICE    Allergies  Allergen Reactions  . Statins Other (See Comments)    Causes Restless Legs, rhabdomyalysis    Chief Complaint  Patient presents with  . Medical Management of Chronic Issues    medication management blood sugar, blood pressure, cholesterol  . restless legs    worse, up at night. Can't sit in evening    HPI:    Essential hypertension - controlled  Restless legs syndrome (RLS) - Legs still twitching and moving around. Mirapex and Sinemet are not giving him adequate relief. He uses Klonopin occasionally, but it also has been marginally effective. He has to get out of the car and walk around when driving. He wakes himself up at night walk around the house. He denies any leg pain or back discomfort.  Shortness of breath - unchanged. States he feels deconditioned and just out of breath because he is not physically fit anymore. He denies cough. No sputum production or wheezing.  Hyperlipidemia - excellent response to Alirocumab  Elevated CPK - last value was normal  Controlled type 2 DM with peripheral circulatory disorder (H[facility]) -patient has gained weight and diabetic control is not as tight as it was in the past  Achilles tendon pain - mild discomfort in the right Achilles tendon    Voice disorder - continues with a slight hoarseness    Medications: Patient's Medications  New Prescriptions   No medications on file  Previous Medications   ACETAMINOPHEN (TYLENOL) 500 MG TABLET    Take 1,000-1,500 mg by mouth every 6 (six) hours as needed for headache.   ALIROCUMAB 75 MG/ML SOPN    Inject 1 each into the skin. Every 2 weeks for cholesterol   AMBULATORY NON FORMULARY MEDICATION    One Touch Ultra Test Strips Sig: Use to test blood sugar four times daily. Dx: E11.51, I10   ASPIRIN 81 MG EC TABLET    Take 1 tablet (81 mg total) by mouth  daily.   BD PEN NEEDLE NANO U/F 32G X 4 MM MISC    USE ONE NEEDLE DAILY WITH LANTUS PEN   CARVEDILOL (COREG) 6.25 MG TABLET    Take 0.5 tablets (3.125 mg total) by mouth 2 (two) times daily with a meal.   CLONAZEPAM (KLONOPIN) 0.5 MG TABLET    One up to 4 times in 24 hours to help control restless legs   GLIMEPIRIDE (AMARYL) 1 MG TABLET    Take 1 tablet (1 mg total) by mouth daily with breakfast. To control diabetes   GLUCOSE BLOOD (ONE TOUCH ULTRA TEST) TEST STRIP    Use to test blood sugar four times daily. Dx: E11.51   HYDROCODONE-ACETAMINOPHEN (NORCO/VICODIN) 5-325 MG TABLET    Take 1 tablet by mouth every 4 (four) hours as needed.   LANTUS SOLOSTAR 100 UNIT/ML SOLOSTAR PEN    INJECT 20 UNITS SUBCUTANEOUSLY IN THE MORNING AND 24 UNITS IN THE EVENING FOR DIABETES   NITROGLYCERIN (NITROSTAT) 0.4 MG SL TABLET    Place 0.4 mg under the tongue every 5 (five) minutes as needed for chest pain.   PANTOPRAZOLE (PROTONIX) 40 MG TABLET    Take one tablet by mouth twice daily for stomach   PRAMIPEXOLE (MIRAPEX) 1 MG TABLET    TAKE 1 TABLET BY MOUTH TWICE A DAY TO HELP WITH RESTLESS LEGS  PRASUGREL (EFFIENT) 10 MG TABS TABLET    Take 1 tablet (10 mg total) by mouth daily.  Modified Medications   No medications on file  Discontinued Medications   No medications on file    Review of Systems  Constitutional: Negative for fever and fatigue.  HENT: Positive for hearing loss and voice change (Hoarseness which has persisted for several weeks. History of vocal cord polyp about 15 years ago.). Negative for congestion and ear pain.   Eyes: Negative.        Wears prescription lenses.  Respiratory: Negative for cough, choking, chest tightness, shortness of breath (with exertion) and wheezing.        Voice change. Dysphonia. 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  Cardiovascular: Negative for chest pain, palpitations and leg swelling.  Gastrointestinal: Negative for nausea, vomiting, abdominal pain, diarrhea,  constipation and abdominal distention.       Barrett's esophagus and reflux history  Endocrine: Negative for cold intolerance, heat intolerance, polydipsia, polyphagia and polyuria.       Diabetes under poor control.  Genitourinary: Negative.   Musculoskeletal: Positive for back pain, joint swelling, arthralgias and gait problem. Negative for myalgias, neck pain and neck stiffness.       Tender left sacroiliac joint area and at the ischium on palpation.  Skin: Negative.   Allergic/Immunologic: Negative.   Neurological: Positive for tremors (both upper and lower extremities), weakness, numbness (to bilateral feet, with  restless legs) and headaches. Negative for dizziness, seizures, syncope, facial asymmetry, speech difficulty and light-headedness.       Improvement in his restless legs  Hematological: Bruises/bleeds easily.       Iron deficiency anemia without obvious blood loss.  Psychiatric/Behavioral: The patient is nervous/anxious.     Filed Vitals:   07/05/15 1215  BP: 124/86  Pulse: 65  Temp: 97.6 F (36.4 C)  TempSrc: Oral  Height: 5' 8"  (1.727 m)  Weight: 186 lb (84.369 kg)  SpO2: 96%   Body mass index is 28.29 kg/(m^2). Filed Weights   07/05/15 1215  Weight: 186 lb (84.369 kg)     Physical Exam  Constitutional: He is oriented to person, place, and time. He appears well-developed and well-nourished. No distress.  overweight  HENT:  Head: Normocephalic and atraumatic.  Right Ear: External ear normal.  Left Ear: External ear normal.  Nose: Nose normal.  Mouth/Throat: Oropharynx is clear and moist.  There is a hoarse quality to his voice as he speaks.  Eyes: Conjunctivae and EOM are normal. Pupils are equal, round, and reactive to light.  Neck: Normal range of motion. Neck supple. No JVD present. No tracheal deviation present. No thyromegaly present.  Cardiovascular: Normal rate, regular rhythm, normal heart sounds and intact distal pulses.  Exam reveals no gallop  and no friction rub.   No murmur heard. Pulmonary/Chest: Effort normal. No respiratory distress. He has no wheezes. He has no rales. He exhibits no tenderness.  Abdominal: Soft. Bowel sounds are normal. He exhibits no distension and no mass.  Musculoskeletal: He exhibits no edema or tenderness.  Bilateral shoulder discomfort with some restriction of movement especially at the left shoulder. Painful to raise left arm. Painful to lift items and to rotate at left shoulder. Has been seen by Dr. Noemi Chapel. Chronic neck pains. Tender at insertion of the left Achilles tendon. Tender anterior to the left heel on the sole of the foot.  Lymphadenopathy:    He has no cervical adenopathy.  Neurological: He is alert and oriented to  person, place, and time. He has normal reflexes. No cranial nerve deficit. Coordination normal.  Bilateral restless legs. Bilateral upper and lower 6 cps tremor. Blink reflex felt to eradicate. There is no significant cogwheeling or rigidity.  Skin: Skin is warm and dry. No rash noted. No erythema. No pallor.  Psychiatric: He has a normal mood and affect. His behavior is normal. Judgment and thought content normal.    Labs reviewed: Lab Summary Latest Ref Rng 07/03/2015 04/14/2015  Hemoglobin 13.0 - 17.0 g/dL (None) 13.5  Hematocrit 39.0 - 52.0 % (None) 39.8  White count 4.0 - 10.5 K/uL (None) 4.7  Platelet count 150 - 400 K/uL (None) 120(L)  Sodium 134 - 144 mmol/L 143 140  Potassium 3.5 - 5.2 mmol/L 4.8 4.4  Calcium 8.6 - 10.2 mg/dL 9.5 9.5  Phosphorus - (None) (None)  Creatinine 0.76 - 1.27 mg/dL 1.07 1.24  AST 0 - 40 IU/L 16 (None)  Alk Phos 39 - 117 IU/L 55 (None)  Bilirubin 0.0 - 1.2 mg/dL 0.3 (None)  Glucose 65 - 99 mg/dL 147(H) 137(H)  Cholesterol - (None) (None)  HDL cholesterol >39 mg/dL 37(L) (None)  Triglycerides 0 - 149 mg/dL 149 (None)  LDL Direct - (None) (None)  LDL Calc 0 - 99 mg/dL 19 (None)  Total protein - (None) (None)  Albumin 3.6 - 4.8 g/dL 4.2  (None)   Lab Results  Component Value Date   TSH 4.810* 09/19/2013   Lab Results  Component Value Date   BUN 16 07/03/2015   BUN 16 04/14/2015   BUN 21 02/10/2015   Lab Results  Component Value Date   HGBA1C 7.4* 07/03/2015   HGBA1C 7.3* 02/10/2015   HGBA1C 6.9* 10/19/2014    Assessment/Plan 1. Essential hypertension - Basic metabolic panel; Future  2. Restless legs syndrome (RLS) Add - gabapentin (NEURONTIN) 100 MG capsule; One twice daily for 4 days, then 2 twice daily for 4 days, then 3 twice daily to help restless legs  Dispense: 90 capsule; Refill: 3  3. Shortness of breath Continue to observe  4. Hyperlipidemia Continue Alirocumab  5. Elevated CPK - CK; Future  6. Controlled type 2 DM with peripheral circulatory disorder (HCC) - Basic metabolic panel; Future - Hemoglobin A1c; Future - Microalbumin, urine; Future  7. Achilles tendon pain Continue to observe  8. Voice disorder Observe  9. TSH elevation - TSH; Future

## 2015-08-07 ENCOUNTER — Telehealth: Payer: Self-pay | Admitting: *Deleted

## 2015-08-07 NOTE — Telephone Encounter (Signed)
Patient called and stated that he has been on Neurotin for a couple weeks now and it makes him feel weak and doesn't feel it works very well. Please Advise.

## 2015-08-09 MED ORDER — PREGABALIN 50 MG PO CAPS: ORAL_CAPSULE | ORAL | Status: DC

## 2015-08-09 NOTE — Telephone Encounter (Signed)
An alternative drug would be Lyrica. We could stop the Neurontin and start him on Lyrica 50 mg twice daily.

## 2015-08-09 NOTE — Telephone Encounter (Signed)
Patient notified and agreed. Rx faxed to pharmacy.  

## 2015-08-21 DIAGNOSIS — Z9861 Coronary angioplasty status: Secondary | ICD-10-CM | POA: Diagnosis not present

## 2015-08-21 DIAGNOSIS — I2510 Atherosclerotic heart disease of native coronary artery without angina pectoris: Secondary | ICD-10-CM | POA: Diagnosis not present

## 2015-08-21 DIAGNOSIS — R0602 Shortness of breath: Secondary | ICD-10-CM | POA: Diagnosis not present

## 2015-08-21 DIAGNOSIS — I209 Angina pectoris, unspecified: Secondary | ICD-10-CM | POA: Diagnosis not present

## 2015-08-28 ENCOUNTER — Encounter: Payer: Self-pay | Admitting: Internal Medicine

## 2015-08-28 DIAGNOSIS — E161 Other hypoglycemia: Secondary | ICD-10-CM | POA: Diagnosis not present

## 2015-08-28 DIAGNOSIS — E162 Hypoglycemia, unspecified: Secondary | ICD-10-CM | POA: Diagnosis not present

## 2015-08-28 DIAGNOSIS — I200 Unstable angina: Secondary | ICD-10-CM | POA: Diagnosis not present

## 2015-08-28 DIAGNOSIS — I2510 Atherosclerotic heart disease of native coronary artery without angina pectoris: Secondary | ICD-10-CM | POA: Diagnosis not present

## 2015-08-28 DIAGNOSIS — R0602 Shortness of breath: Secondary | ICD-10-CM | POA: Diagnosis not present

## 2015-08-31 DIAGNOSIS — R0602 Shortness of breath: Secondary | ICD-10-CM | POA: Diagnosis not present

## 2015-08-31 DIAGNOSIS — I2510 Atherosclerotic heart disease of native coronary artery without angina pectoris: Secondary | ICD-10-CM | POA: Diagnosis not present

## 2015-08-31 DIAGNOSIS — I209 Angina pectoris, unspecified: Secondary | ICD-10-CM | POA: Diagnosis not present

## 2015-09-13 ENCOUNTER — Encounter (HOSPITAL_COMMUNITY): Payer: Self-pay | Admitting: Emergency Medicine

## 2015-09-13 ENCOUNTER — Emergency Department (HOSPITAL_COMMUNITY): Payer: Medicare Other

## 2015-09-13 ENCOUNTER — Emergency Department (HOSPITAL_COMMUNITY)
Admission: EM | Admit: 2015-09-13 | Discharge: 2015-09-13 | Disposition: A | Discharge status: Home / Self Care | Payer: Medicare Other | Attending: Emergency Medicine | Admitting: Emergency Medicine

## 2015-09-13 DIAGNOSIS — R0602 Shortness of breath: Secondary | ICD-10-CM | POA: Diagnosis not present

## 2015-09-13 DIAGNOSIS — R251 Tremor, unspecified: Secondary | ICD-10-CM | POA: Insufficient documentation

## 2015-09-13 DIAGNOSIS — I2510 Atherosclerotic heart disease of native coronary artery without angina pectoris: Secondary | ICD-10-CM | POA: Insufficient documentation

## 2015-09-13 DIAGNOSIS — R259 Unspecified abnormal involuntary movements: Secondary | ICD-10-CM

## 2015-09-13 DIAGNOSIS — G252 Other specified forms of tremor: Secondary | ICD-10-CM | POA: Diagnosis not present

## 2015-09-13 DIAGNOSIS — Z7982 Long term (current) use of aspirin: Secondary | ICD-10-CM | POA: Insufficient documentation

## 2015-09-13 DIAGNOSIS — Z5181 Encounter for therapeutic drug level monitoring: Secondary | ICD-10-CM | POA: Diagnosis not present

## 2015-09-13 DIAGNOSIS — R069 Unspecified abnormalities of breathing: Secondary | ICD-10-CM | POA: Diagnosis not present

## 2015-09-13 LAB — CBC WITH DIFFERENTIAL/PLATELET
BASOS ABS: 0 10*3/uL (ref 0.0–0.1)
BASOS PCT: 0 %
EOS PCT: 2 %
Eosinophils Absolute: 0.1 10*3/uL (ref 0.0–0.7)
HEMATOCRIT: 28.7 % — AB (ref 39.0–52.0)
Hemoglobin: 9.1 g/dL — ABNORMAL LOW (ref 13.0–17.0)
Lymphocytes Relative: 17 %
Lymphs Abs: 0.7 10*3/uL (ref 0.7–4.0)
MCH: 23.7 pg — ABNORMAL LOW (ref 26.0–34.0)
MCHC: 31.7 g/dL (ref 30.0–36.0)
MCV: 74.7 fL — AB (ref 78.0–100.0)
MONO ABS: 0.3 10*3/uL (ref 0.1–1.0)
MONOS PCT: 8 %
NEUTROS ABS: 3 10*3/uL (ref 1.7–7.7)
Neutrophils Relative %: 72 %
PLATELETS: 101 10*3/uL — AB (ref 150–400)
RBC: 3.84 MIL/uL — ABNORMAL LOW (ref 4.22–5.81)
RDW: 15 % (ref 11.5–15.5)
WBC: 4.2 10*3/uL (ref 4.0–10.5)

## 2015-09-13 LAB — I-STAT TROPONIN, ED
Troponin i, poc: 0.01 ng/mL (ref 0.00–0.08)
Troponin i, poc: 0 ng/mL (ref 0.00–0.08)

## 2015-09-13 LAB — URINALYSIS, ROUTINE W REFLEX MICROSCOPIC
Bilirubin Urine: NEGATIVE
Hgb urine dipstick: NEGATIVE
Ketones, ur: NEGATIVE mg/dL
LEUKOCYTES UA: NEGATIVE
NITRITE: NEGATIVE
PH: 5.5 (ref 5.0–8.0)
Protein, ur: NEGATIVE mg/dL
SPECIFIC GRAVITY, URINE: 1.023 (ref 1.005–1.030)

## 2015-09-13 LAB — RAPID URINE DRUG SCREEN, HOSP PERFORMED
Amphetamines: NOT DETECTED
BARBITURATES: NOT DETECTED
BENZODIAZEPINES: NOT DETECTED
COCAINE: NOT DETECTED
Opiates: NOT DETECTED
TETRAHYDROCANNABINOL: NOT DETECTED

## 2015-09-13 LAB — BASIC METABOLIC PANEL
ANION GAP: 6 (ref 5–15)
BUN: 14 mg/dL (ref 6–20)
CALCIUM: 8.5 mg/dL — AB (ref 8.9–10.3)
CO2: 24 mmol/L (ref 22–32)
Chloride: 105 mmol/L (ref 101–111)
Creatinine, Ser: 1.15 mg/dL (ref 0.61–1.24)
GLUCOSE: 186 mg/dL — AB (ref 65–99)
Potassium: 3.7 mmol/L (ref 3.5–5.1)
SODIUM: 135 mmol/L (ref 135–145)

## 2015-09-13 LAB — URINE MICROSCOPIC-ADD ON
RBC / HPF: NONE SEEN RBC/hpf (ref 0–5)
WBC, UA: NONE SEEN WBC/hpf (ref 0–5)

## 2015-09-13 LAB — POC OCCULT BLOOD, ED: Fecal Occult Bld: NEGATIVE

## 2015-09-13 LAB — BRAIN NATRIURETIC PEPTIDE: B NATRIURETIC PEPTIDE 5: 113.6 pg/mL — AB (ref 0.0–100.0)

## 2015-09-13 MED ORDER — ACETAMINOPHEN 325 MG PO TABS
650.0000 mg | ORAL_TABLET | Freq: Once | ORAL | Status: AC
  Administered 2015-09-13: 650 mg via ORAL
  Filled 2015-09-13: qty 2

## 2015-09-13 NOTE — ED Provider Notes (Signed)
10:50 PM Patient care assumed from [place] Joy, [place] at shift change. Plan discussed which includes discharge if delta troponin negative and patient able to ambulate without hypoxia.   Patient ambulatory in the emergency department with saturations of 99%. Delta troponin also negative. Given reassuring evaluation, no indication for further emergent workup. Patient instructed to return if symptoms worsen. Patient discharged in satisfactory condition.    EKG Interpretation  Date/Time:  Wednesday September 13 2015 18:04:13 EDT Ventricular Rate:  66 PR Interval:    QRS Duration: 97 QT Interval:  425 QTC Calculation: 446 R Axis:     Text Interpretation:  Sinus rhythm Low voltage, precordial leads No acute changes Confirmed by LIU MD, DANA [phone]([id number]) on 09/13/2015 6:47:38 PM        Antony MaduraKelly Amberli Ruegg, PA-C 09/13/15 2256  Lavera Guiseana Duo Liu, MD 09/14/15 1136

## 2015-09-13 NOTE — ED Notes (Signed)
Pt comes from home via EMS with c/o difficulty breathing with exertion. Pt denies chest pain. Pt has an extensive cardiac history. Pt given 324mg  ASA with EMS. No nitro given, 500cc of Nacl with BGL of 288.

## 2015-09-13 NOTE — ED Notes (Signed)
Pt walked in hallway with no issues. Pulse ox went to 99%.

## 2015-09-13 NOTE — ED Provider Notes (Signed)
CSN: [id number]     Arrival date & time 09/13/15  1740 History   First MD Initiated Contact with Patient 09/13/15 1805     Chief Complaint  Patient presents with  . Shortness of Breath     (Consider location/radiation/quality/duration/timing/severity/associated sxs/prior Treatment) HPI   Kenneth Reifsteck Sr. is a 65 y.o. male, with a history of IDDM, restless leg syndrome, extensive cardiac history, presenting to the ED with Exertional shortness of breath, increasing over the last 3 months. Patient endorses 3 episodes today alone. Exertion can be as simple as walking up stairs to the front porch or even a small hill. Patient states the sensation "like someone squeezing my throat." This sensation resolves in between 30 seconds and 1 minute of complete rest. Recent echo and stress tests with no significant findings. [name] is patient's cardiologist.  Only new medication is Lyrica for restless leg syndrome, prescribed by [name]. States his symtpoms have been worse since starting the Lyrica. Has not seen [name] again since starting this medication. Pt also endorses the following: Increased tremors over last 5 years, moving into both upper extremities and head jerking. Over the last 6 months, pt endorses some difficulty with memory and comprehension. Other subtle changes such as difficulty with sense of direction while driving when he previously never had trouble with this. Also endorses being easily fatigued, difficulty with coordination, and overall weakness. Patient saw a neurologist a few years ago for his restless leg syndrome, but his other symptoms have not been discussed with a neurologist, rather he has only had the exertional shortness of breath evaluated by the cardiologist. Patient is accompanied by his wife at the bedside.   Past Medical History  Diagnosis Date  . Restless leg   . Cervicalgia   . Impotence of organic origin   . Barrett's esophagus     "we've been told that it's all gone;  still take RX for GERD" (05/03/2014)  . Unspecified gastritis and gastroduodenitis with hemorrhage   . Hyperlipidemia   . Other malaise and fatigue   . Hypoglycemia, unspecified   . GERD (gastroesophageal reflux disease)   . Chronic bronchitis (HCC)     "sometimes 2-3 times/yr" (05/03/2014)  . H/O hiatal hernia   . Unstable angina pectoris (HCC) 04/08/2014    Coronary angiogram 05/03/2014:  Proximal LAD 3.0x12 mm Promus premier stent.  04/08/2014: Mid Cx 3.5 x 16 mm Promus DES.  03/29/2013: Mid LAD 2.75 x 38 mm Promus Premier drug-eluting stent, balloon angioplasty of D1 and distal LAD and stenting of distal RCA with 2.75 x 24 mm promos Premier drug-eluting stent 12/15/2012 widely patent.  Marland Kitchen CAD (coronary artery disease), native coronary artery     Coronary angiogram 05/03/2014:  Proximal LAD 3.0x12 mm Promus premier stent.  04/08/2014: Mid Cx 3.5 x 16 mm Promus DES.  03/29/2013: Mid LAD 2.75 x 38 mm Promus Premier drug-eluting stent, balloon angioplasty of D1 and distal LAD and stenting of distal RCA with 2.75 x 24 mm promos Premier drug-eluting stent 12/15/2012 widely patent.  . Complication of anesthesia     " I do not wake up very well "  . PONV (postoperative nausea and vomiting)   . Family history of adverse reaction to anesthesia     "son w/PONV"  . Heart murmur     "grew out of it"   . IDDM (insulin dependent diabetes mellitus) (HCC)   . Tension headache   . Headache     "weekly" (05/03/2014)  Past Surgical History  Procedure Laterality Date  . Knee surgery Right 1992;  2000    "calcium deposits removed" (12/15/2012)  . Incision and drainage abscess Left 05/2007    "groin"   . Left heart catheterization with coronary angiogram N/A 12/15/2012    Procedure: LEFT HEART CATHETERIZATION WITH CORONARY ANGIOGRAM;  Surgeon: Pamella Pert, MD;  Location: [facility] CATH LAB;  Service: Cardiovascular;  Laterality: N/A;  . Left heart catheterization with coronary angiogram N/A 03/26/2013    Procedure: LEFT  HEART CATHETERIZATION WITH CORONARY ANGIOGRAM;  Surgeon: Pamella Pert, MD;  Location: [facility] CATH LAB;  Service: Cardiovascular;  Laterality: N/A;  . Fractional flow reserve wire Right 03/26/2013    Procedure: FRACTIONAL FLOW RESERVE WIRE;  Surgeon: Pamella Pert, MD;  Location: [facility] CATH LAB;  Service: Cardiovascular;  Laterality: Right;  . Left heart catheterization with coronary angiogram N/A 04/08/2014    Procedure: LEFT HEART CATHETERIZATION WITH CORONARY ANGIOGRAM;  Surgeon: Micheline Chapman, MD;  Location: [facility] CATH LAB;  Service: Cardiovascular;  Laterality: N/A;  . Coronary angioplasty with stent placement  12/15/2012; 04/28/2014; 05/03/2014    "2; 1; 1"   . Umbilical hernia repair    . Hernia repair  2010    "umbilical"   . Percutaneous coronary stent intervention (pci-s) N/A 05/03/2014    Procedure: PERCUTANEOUS CORONARY STENT INTERVENTION (PCI-S);  Surgeon: Pamella Pert, MD;  Location: [facility] CATH LAB;  Service: Cardiovascular;  Laterality: N/A;  . Fractional flow reserve wire N/A 05/03/2014    Procedure: FRACTIONAL FLOW RESERVE WIRE;  Surgeon: Pamella Pert, MD;  Location: [facility] CATH LAB;  Service: Cardiovascular;  Laterality: N/A;   Family History  Problem Relation Age of Onset  . Adopted: Yes  . Family history unknown: Yes   Social History  Substance Use Topics  . Smoking status: Never Smoker   . Smokeless tobacco: Never Used  . Alcohol Use: No    Review of Systems  Constitutional: Negative for fever, chills and diaphoresis.  HENT: Negative for trouble swallowing and voice change.   Respiratory: Positive for shortness of breath (exertional). Negative for cough.   Cardiovascular: Negative for chest pain.  Gastrointestinal: Negative for nausea and vomiting.  Neurological: Positive for tremors and weakness. Negative for dizziness, seizures, syncope, facial asymmetry, speech difficulty, light-headedness, numbness and headaches.  All other systems reviewed and are  negative.    Allergies  Statins  Home Medications   Prior to Admission medications   Medication Sig Start Date End Date Taking? Authorizing Provider  acetaminophen (TYLENOL) 500 MG tablet Take 1,000-1,500 mg by mouth every 6 (six) hours as needed for headache.    Historical Provider, MD  Alirocumab 75 MG/ML SOPN Inject 1 each into the skin. Every 2 weeks for cholesterol    Historical Provider, MD  AMBULATORY NON FORMULARY MEDICATION One Touch Ultra Test Strips Sig: Use to test blood sugar four times daily. Dx: E11.51, I10 05/23/14   Kimber Relic, MD  aspirin 81 MG EC tablet Take 1 tablet (81 mg total) by mouth daily. 12/16/12   [name], MD  BD PEN NEEDLE NANO U/F 32G X 4 MM MISC USE ONE NEEDLE DAILY WITH LANTUS PEN 08/02/14   [name], NP  carvedilol (COREG) 6.25 MG tablet Take 0.5 tablets (3.125 mg total) by mouth 2 (two) times daily with a meal. 05/15/15   Kimber Relic, MD  clonazePAM (KLONOPIN) 0.5 MG tablet One up to 4 times in 24 hours to help  control restless legs 04/26/15   Kimber RelicArthur G Green, MD  glimepiride (AMARYL) 1 MG tablet Take 1 tablet (1 mg total) by mouth daily with breakfast. To control diabetes 05/15/15   Kimber RelicArthur G Green, MD  glucose blood (ONE TOUCH ULTRA TEST) test strip Use to test blood sugar four times daily. Dx: E11.51 01/17/15   Sharon SellerJessica K Eubanks, NP  HYDROcodone-acetaminophen (NORCO/VICODIN) 5-325 MG tablet Take 1 tablet by mouth every 4 (four) hours as needed. 04/14/15   Cheri FowlerKayla Rose, PA-C  LANTUS SOLOSTAR 100 UNIT/ML Solostar Pen INJECT 20 UNITS SUBCUTANEOUSLY IN THE MORNING AND 24 UNITS IN THE EVENING FOR DIABETES 05/29/15   Kimber RelicArthur G Green, MD  nitroGLYCERIN (NITROSTAT) 0.4 MG SL tablet Place 0.4 mg under the tongue every 5 (five) minutes as needed for chest pain.    Historical Provider, MD  pantoprazole (PROTONIX) 40 MG tablet Take one tablet by mouth twice daily for stomach 05/15/15   Kimber RelicArthur G Green, MD  pramipexole (MIRAPEX) 1 MG tablet TAKE 1 TABLET BY MOUTH  TWICE A DAY TO HELP WITH RESTLESS LEGS 05/22/15   Kimber RelicArthur G Green, MD  prasugrel (EFFIENT) 10 MG TABS tablet Take 1 tablet (10 mg total) by mouth daily. 12/16/12   Yates DecampJay Ganji, MD  pregabalin (LYRICA) 50 MG capsule Take one capsule by mouth twice daily for pains 08/09/15   Kimber RelicArthur G Green, MD   BP 128/81 mmHg  Pulse 63  Temp(Src) 98.2 F (36.8 C) (Oral)  Resp 16  Ht 5\' 8"  (1.727 m)  Wt 83.462 kg  BMI 27.98 kg/m2  SpO2 97% Physical Exam  Constitutional: He is oriented to person, place, and time. He appears well-developed and well-nourished. No distress.  HENT:  Head: Normocephalic and atraumatic.  Mouth/Throat: Oropharynx is clear and moist.  Swallowing ability is intact. No ptosis noted.  Eyes: Conjunctivae and EOM are normal. Pupils are equal, round, and reactive to light.  Neck: Normal range of motion. Neck supple.  Cardiovascular: Normal rate, regular rhythm, normal heart sounds and intact distal pulses.   Pulmonary/Chest: Effort normal and breath sounds normal. No respiratory distress.  No increased work of breathing. Patient speaks in full sentences without difficulty.  Abdominal: Soft. There is no tenderness. There is no guarding.  Musculoskeletal: He exhibits no edema or tenderness.  Lymphadenopathy:    He has no cervical adenopathy.  Neurological: He is alert and oriented to person, place, and time. He has normal reflexes.  No sensory deficits. Strength 5/5 in all extremities. No gait disturbance. Coordination intact. Cranial nerves III-XII grossly intact. No facial droop.   Patient noted to have resting tremors, worse in the lower extremities but also present in the small muscles of the hands. As the patient relaxed more, the muscles in his neck started to jerk as well.  Skin: Skin is warm and dry. He is not diaphoretic.  Psychiatric: He has a normal mood and affect. His behavior is normal.  Nursing note and vitals reviewed.   ED Course  Procedures (including critical care  time) Labs Review Labs Reviewed  BASIC METABOLIC PANEL - Abnormal; Notable for the following:    Glucose, Bld 186 (*)    Calcium 8.5 (*)    All other components within normal limits  CBC WITH DIFFERENTIAL/PLATELET - Abnormal; Notable for the following:    RBC 3.84 (*)    Hemoglobin 9.1 (*)    HCT 28.7 (*)    MCV 74.7 (*)    MCH 23.7 (*)    Platelets 101 (*)  All other components within normal limits  BRAIN NATRIURETIC PEPTIDE - Abnormal; Notable for the following:    B Natriuretic Peptide 113.6 (*)    All other components within normal limits  URINALYSIS, ROUTINE W REFLEX MICROSCOPIC (NOT AT [facility])  URINE RAPID DRUG SCREEN, HOSP PERFORMED  I-STAT TROPOININ, ED  POC OCCULT BLOOD, ED    Imaging Review Dg Chest 2 View  09/13/2015  CLINICAL DATA:  Difficulty breathing with exertion. EXAM: CHEST  2 VIEW COMPARISON:  Chest radiograph 04/14/2015 FINDINGS: A coronary artery stent is again seen. Cardiomediastinal contours are normal. There is no pleural effusion or pneumothorax. No focal airspace consolidation or pulmonary edema. IMPRESSION: No focal airspace disease or pulmonary edema. Electronically Signed   By: Deatra Robinson M.D.   On: 09/13/2015 19:12   I have personally reviewed and evaluated these images and lab results as part of my medical decision-making.   EKG Interpretation   Date/Time:  Wednesday September 13 2015 18:04:13 EDT Ventricular Rate:  66 PR Interval:    QRS Duration: 97 QT Interval:  425 QTC Calculation: 446 R Axis:     Text Interpretation:  Sinus rhythm Low voltage, precordial leads No acute  changes Confirmed by LIU MD, Annabelle Harman ([id number]) on 09/13/2015 6:47:38 PM      MDM   Final diagnoses:  Exertional shortness of breath  Resting tremor    Fortino Sic Sr. presents with exertional shortness of breath over the last 3 months, worse today.  Findings and plan of care discussed with Lavera Guise, MD.   Patient is nontoxic appearing and shows no increased work of  breathing. Patient is able to ambulate to the bathroom without difficulty. Anemia noted and may be a factor in the patient's symptoms of exertional shortness of breath. However, I do believe there is a neurologic component to his other symptoms, such as Parkinson's disease or other similar neuromuscular disorder. This will need to be further evaluated by neurology outpatient.  8:33 PM End of shift patient care handoff report given to Antony Madura, PA-C. Plan: Review delta troponins, urinalysis, and Hemoccult. Ambulate with pulse ox. Have a lower threshold for admission due to this patient's cardiac history. Patient should follow-up with his cardiologist as well as with a neurologist. Multiple neurology options were listed on his discharge follow-up instructions.  Filed Vitals:   09/13/15 1745 09/13/15 1815 09/13/15 1830 09/13/15 1845  BP: 133/81 133/82 145/89 128/81  Pulse: 64 69 68 63  Temp: 98.2 F (36.8 C)     TempSrc: Oral     Resp:   23 16  Height: 5\' 8"  (1.727 m)     Weight: 83.462 kg     SpO2: 99% 98% 98% 97%           Anselm Pancoast, PA-C 09/13/15 2036  Lavera Guise, MD 09/14/15 1137

## 2015-09-13 NOTE — Discharge Instructions (Signed)
You have been seen today for exertional shortness of breath. Your imaging and lab tests showed no abnormalities. Follow up with PCP as needed. Return to ED should symptoms worsen.

## 2015-09-13 NOTE — ED Notes (Signed)
Pt verbalized understanding of discharge instructions and follow-up care. NAD at time of discharge.

## 2015-09-15 ENCOUNTER — Telehealth: Payer: Self-pay | Admitting: *Deleted

## 2015-09-15 NOTE — Telephone Encounter (Signed)
Patient called and stated that he has some concerns with his Lyrica. Stated that he will walk about 20 feet and he is SOB. Stated that he has had heart workup and has gone to the ER recently and its not Cardiac. Patient wants to know if this can be discontinued to see if he gets better. Understands that Dr. Chilton SiGreen is out of office till Tuesday. Please Advise.

## 2015-09-18 ENCOUNTER — Encounter (HOSPITAL_COMMUNITY): Payer: Self-pay | Admitting: Emergency Medicine

## 2015-09-18 ENCOUNTER — Emergency Department (HOSPITAL_COMMUNITY): Payer: Medicare Other

## 2015-09-18 ENCOUNTER — Emergency Department (HOSPITAL_COMMUNITY)
Admission: EM | Admit: 2015-09-18 | Discharge: 2015-09-19 | Disposition: A | Discharge status: Home / Self Care | Payer: Medicare Other | Attending: Emergency Medicine | Admitting: Emergency Medicine

## 2015-09-18 DIAGNOSIS — R079 Chest pain, unspecified: Secondary | ICD-10-CM | POA: Diagnosis not present

## 2015-09-18 DIAGNOSIS — Z7982 Long term (current) use of aspirin: Secondary | ICD-10-CM | POA: Diagnosis not present

## 2015-09-18 DIAGNOSIS — I2510 Atherosclerotic heart disease of native coronary artery without angina pectoris: Secondary | ICD-10-CM | POA: Insufficient documentation

## 2015-09-18 DIAGNOSIS — R0789 Other chest pain: Secondary | ICD-10-CM | POA: Diagnosis not present

## 2015-09-18 DIAGNOSIS — J449 Chronic obstructive pulmonary disease, unspecified: Secondary | ICD-10-CM | POA: Insufficient documentation

## 2015-09-18 DIAGNOSIS — Z794 Long term (current) use of insulin: Secondary | ICD-10-CM | POA: Diagnosis not present

## 2015-09-18 DIAGNOSIS — Z7984 Long term (current) use of oral hypoglycemic drugs: Secondary | ICD-10-CM | POA: Insufficient documentation

## 2015-09-18 DIAGNOSIS — E11649 Type 2 diabetes mellitus with hypoglycemia without coma: Secondary | ICD-10-CM | POA: Insufficient documentation

## 2015-09-18 LAB — BASIC METABOLIC PANEL
Anion gap: 7 (ref 5–15)
BUN: 17 mg/dL (ref 6–20)
CHLORIDE: 105 mmol/L (ref 101–111)
CO2: 24 mmol/L (ref 22–32)
CREATININE: 1.09 mg/dL (ref 0.61–1.24)
Calcium: 8.8 mg/dL — ABNORMAL LOW (ref 8.9–10.3)
GFR calc Af Amer: >60 mL/min (ref >60)
GLUCOSE: 131 mg/dL — AB (ref 65–99)
POTASSIUM: 4 mmol/L (ref 3.5–5.1)
SODIUM: 136 mmol/L (ref 135–145)

## 2015-09-18 LAB — CBC
HEMATOCRIT: 30.3 % — AB (ref 39.0–52.0)
Hemoglobin: 9.3 g/dL — ABNORMAL LOW (ref 13.0–17.0)
MCH: 22.9 pg — ABNORMAL LOW (ref 26.0–34.0)
MCHC: 30.7 g/dL (ref 30.0–36.0)
MCV: 74.4 fL — AB (ref 78.0–100.0)
PLATELETS: 176 10*3/uL (ref 150–400)
RBC: 4.07 MIL/uL — ABNORMAL LOW (ref 4.22–5.81)
RDW: 15 % (ref 11.5–15.5)
WBC: 5 10*3/uL (ref 4.0–10.5)

## 2015-09-18 LAB — I-STAT TROPONIN, ED: Troponin i, poc: 0 ng/mL (ref 0.00–0.08)

## 2015-09-18 MED ORDER — HYDROCODONE-ACETAMINOPHEN 5-325 MG PO TABS
2.0000 | ORAL_TABLET | Freq: Once | ORAL | Status: AC
  Administered 2015-09-18: 2 via ORAL
  Filled 2015-09-18: qty 2

## 2015-09-18 NOTE — ED Notes (Signed)
Patient transported to X-ray 

## 2015-09-18 NOTE — Discharge Instructions (Signed)

## 2015-09-18 NOTE — ED Notes (Signed)
Pt reports that he was "experining my normal chest ache" which turned into Right sided stabbing chest pain.  Was here for same 5 days ago, Was given 324 ASA and 2 nitro w/ no relief, still has 9-10 pain.

## 2015-09-18 NOTE — ED Provider Notes (Signed)
CSN: [id number]     Arrival date & time 09/18/15  2108 History   First MD Initiated Contact with Patient 09/18/15 2134     Chief Complaint  Patient presents with  . Chest Pain     (Consider location/radiation/quality/duration/timing/severity/associated sxs/prior Treatment) HPI  The patient is a 65 year old male, he has known coronary disease status post stenting 1 year ago as well as status post stress test 2 weeks ago which she reports was normal. He has had a chronic dyspnea on exertion as well as some increasing chest discomfort and in fact reports that today for the most part of the day he has had a discomfort in the middle of his chest and about 4:00 in the afternoon developed an increasing stabbing sensation in the right side of his chest. This has been persistent, it is not positional, it is not related to deep breathing or eating or supine or prone position. He has no fevers, no coughing, no swelling of the legs. He has taken aspirin prior to arrival.  Past Medical History  Diagnosis Date  . Restless leg   . Cervicalgia   . Impotence of organic origin   . Barrett's esophagus     "we've been told that it's all gone; still take RX for GERD" (05/03/2014)  . Unspecified gastritis and gastroduodenitis with hemorrhage   . Hyperlipidemia   . Other malaise and fatigue   . Hypoglycemia, unspecified   . GERD (gastroesophageal reflux disease)   . Chronic bronchitis (HCC)     "sometimes 2-3 times/yr" (05/03/2014)  . H/O hiatal hernia   . Unstable angina pectoris (HCC) 04/08/2014    Coronary angiogram 05/03/2014:  Proximal LAD 3.0x12 mm Promus premier stent.  04/08/2014: Mid Cx 3.5 x 16 mm Promus DES.  03/29/2013: Mid LAD 2.75 x 38 mm Promus Premier drug-eluting stent, balloon angioplasty of D1 and distal LAD and stenting of distal RCA with 2.75 x 24 mm promos Premier drug-eluting stent 12/15/2012 widely patent.  Marland Kitchen CAD (coronary artery disease), native coronary artery     Coronary angiogram 05/03/2014:   Proximal LAD 3.0x12 mm Promus premier stent.  04/08/2014: Mid Cx 3.5 x 16 mm Promus DES.  03/29/2013: Mid LAD 2.75 x 38 mm Promus Premier drug-eluting stent, balloon angioplasty of D1 and distal LAD and stenting of distal RCA with 2.75 x 24 mm promos Premier drug-eluting stent 12/15/2012 widely patent.  . Complication of anesthesia     " I do not wake up very well "  . PONV (postoperative nausea and vomiting)   . Family history of adverse reaction to anesthesia     "son w/PONV"  . Heart murmur     "grew out of it"   . IDDM (insulin dependent diabetes mellitus) (HCC)   . Tension headache   . Headache     "weekly" (05/03/2014)   Past Surgical History  Procedure Laterality Date  . Knee surgery Right 1992;  2000    "calcium deposits removed" (12/15/2012)  . Incision and drainage abscess Left 05/2007    "groin"   . Left heart catheterization with coronary angiogram N/A 12/15/2012    Procedure: LEFT HEART CATHETERIZATION WITH CORONARY ANGIOGRAM;  Surgeon: Pamella Pert, MD;  Location: [facility] CATH LAB;  Service: Cardiovascular;  Laterality: N/A;  . Left heart catheterization with coronary angiogram N/A 03/26/2013    Procedure: LEFT HEART CATHETERIZATION WITH CORONARY ANGIOGRAM;  Surgeon: Pamella Pert, MD;  Location: [facility] CATH LAB;  Service: Cardiovascular;  Laterality: N/A;  .  Fractional flow reserve wire Right 03/26/2013    Procedure: FRACTIONAL FLOW RESERVE WIRE;  Surgeon: Pamella Pert, MD;  Location: [facility] CATH LAB;  Service: Cardiovascular;  Laterality: Right;  . Left heart catheterization with coronary angiogram N/A 04/08/2014    Procedure: LEFT HEART CATHETERIZATION WITH CORONARY ANGIOGRAM;  Surgeon: Micheline Chapman, MD;  Location: [facility] CATH LAB;  Service: Cardiovascular;  Laterality: N/A;  . Coronary angioplasty with stent placement  12/15/2012; 04/28/2014; 05/03/2014    "2; 1; 1"   . Umbilical hernia repair    . Hernia repair  2010    "umbilical"   . Percutaneous coronary stent intervention  (pci-s) N/A 05/03/2014    Procedure: PERCUTANEOUS CORONARY STENT INTERVENTION (PCI-S);  Surgeon: Pamella Pert, MD;  Location: [facility] CATH LAB;  Service: Cardiovascular;  Laterality: N/A;  . Fractional flow reserve wire N/A 05/03/2014    Procedure: FRACTIONAL FLOW RESERVE WIRE;  Surgeon: Pamella Pert, MD;  Location: [facility] CATH LAB;  Service: Cardiovascular;  Laterality: N/A;   Family History  Problem Relation Age of Onset  . Adopted: Yes  . Family history unknown: Yes   Social History  Substance Use Topics  . Smoking status: Never Smoker   . Smokeless tobacco: Never Used  . Alcohol Use: No    Review of Systems  All other systems reviewed and are negative.     Allergies  Lyrica and Statins  Home Medications   Prior to Admission medications   Medication Sig Start Date End Date Taking? Authorizing Provider  acetaminophen (TYLENOL) 500 MG tablet Take 1,000-1,500 mg by mouth every 6 (six) hours as needed for headache.   Yes Historical Provider, MD  Alirocumab (PRALUENT) 75 MG/ML SOPN Inject 1 application into the skin every 14 (fourteen) days.   Yes Historical Provider, MD  aspirin 81 MG EC tablet Take 1 tablet (81 mg total) by mouth daily. 12/16/12  Yes [name], MD  carvedilol (COREG) 6.25 MG tablet Take 0.5 tablets (3.125 mg total) by mouth 2 (two) times daily with a meal. 05/15/15  Yes Kimber Relic, MD  clonazePAM (KLONOPIN) 0.5 MG tablet One up to 4 times in 24 hours to help control restless legs Patient taking differently: Take 0.5 mg by mouth 4 (four) times daily as needed (for tremors). One up to 4 times in 24 hours to help control restless legs 04/26/15  Yes Kimber Relic, MD  glimepiride (AMARYL) 1 MG tablet Take 1 tablet (1 mg total) by mouth daily with breakfast. To control diabetes Patient taking differently: Take 1 mg by mouth daily as needed (for hyperglycemia only). To control diabetes 05/15/15  Yes Kimber Relic, MD  LANTUS SOLOSTAR 100 UNIT/ML Solostar Pen INJECT 20  UNITS SUBCUTANEOUSLY IN THE MORNING AND 24 UNITS IN THE EVENING FOR DIABETES Patient taking differently: INJECT 20 UNITS SUBCUTANEOUSLY IN THE MORNING AND 26 UNITS IN THE EVENING FOR DIABETES 05/29/15  Yes Kimber Relic, MD  nitroGLYCERIN (NITROSTAT) 0.4 MG SL tablet Place 0.4 mg under the tongue every 5 (five) minutes as needed for chest pain.   Yes Historical Provider, MD  pantoprazole (PROTONIX) 40 MG tablet Take one tablet by mouth twice daily for stomach 05/15/15  Yes Kimber Relic, MD  pramipexole (MIRAPEX) 1 MG tablet TAKE 1 TABLET BY MOUTH TWICE A DAY TO HELP WITH RESTLESS LEGS 05/22/15  Yes Kimber Relic, MD  prasugrel (EFFIENT) 10 MG TABS tablet Take 1 tablet (10 mg total) by mouth daily. 12/16/12  Yes  [name], MD  AMBULATORY NON FORMULARY MEDICATION One Touch Ultra Test Strips Sig: Use to test blood sugar four times daily. Dx: E11.51, I10 05/23/14   Kimber Relic, MD  BD PEN NEEDLE NANO U/F 32G X 4 MM MISC USE ONE NEEDLE DAILY WITH LANTUS PEN 08/02/14   [name], NP  glucose blood (ONE TOUCH ULTRA TEST) test strip Use to test blood sugar four times daily. Dx: E11.51 01/17/15   [name], NP  HYDROcodone-acetaminophen (NORCO/VICODIN) 5-325 MG tablet Take 1 tablet by mouth every 4 (four) hours as needed. 04/14/15   Cheri Fowler, PA-C  pregabalin (LYRICA) 50 MG capsule Take one capsule by mouth twice daily for pains 08/09/15   Kimber Relic, MD   BP 122/66 mmHg  Pulse 51  Temp(Src) 98.7 F (37.1 C) (Oral)  Resp 18  Ht  (1.727 m)  Wt 184 lb (83.462 kg)  BMI 27.98 kg/m2  SpO2 100% Physical Exam  Constitutional: He appears well-developed and well-nourished. No distress.  HENT:  Head: Normocephalic and atraumatic.  Mouth/Throat: Oropharynx is clear and moist. No oropharyngeal exudate.  Eyes: Conjunctivae and EOM are normal. Pupils are equal, round, and reactive to light. Right eye exhibits no discharge. Left eye exhibits no discharge. No scleral icterus.  Neck:  Normal range of motion. Neck supple. No JVD present. No thyromegaly present.  Cardiovascular: Normal rate, regular rhythm, normal heart sounds and intact distal pulses.  Exam reveals no gallop and no friction rub.   No murmur heard. Pulmonary/Chest: Effort normal and breath sounds normal. No respiratory distress. He has no wheezes. He has no rales. He exhibits no tenderness.  Abdominal: Soft. Bowel sounds are normal. He exhibits no distension and no mass. There is no tenderness.  Musculoskeletal: Normal range of motion. He exhibits no edema or tenderness.  Lymphadenopathy:    He has no cervical adenopathy.  Neurological: He is alert. Coordination normal.  Skin: Skin is warm and dry. No rash noted. No erythema.  Psychiatric: He has a normal mood and affect. His behavior is normal.  Nursing note and vitals reviewed.   ED Course  Procedures (including critical care time) Labs Review Labs Reviewed  BASIC METABOLIC PANEL - Abnormal; Notable for the following:    Glucose, Bld 131 (*)    Calcium 8.8 (*)    All other components within normal limits  CBC - Abnormal; Notable for the following:    RBC 4.07 (*)    Hemoglobin 9.3 (*)    HCT 30.3 (*)    MCV 74.4 (*)    MCH 22.9 (*)    All other components within normal limits  Rosezena Sensor, ED    Imaging Review Dg Chest 2 View  09/18/2015  CLINICAL DATA:  Stabbing right chest pain. EXAM: CHEST  2 VIEW COMPARISON:  09/13/2015 FINDINGS: Normal heart size and mediastinal contours. Coronary stent noted. No acute infiltrate or edema. No effusion or pneumothorax. No acute osseous findings. IMPRESSION: No active cardiopulmonary disease. Electronically Signed   By: Marnee Spring M.D.   On: 09/18/2015 22:35   I have personally reviewed and evaluated these images and lab results as part of my medical decision-making.   EKG Interpretation   Date/Time:  Monday September 18 2015 21:23:53 EDT Ventricular Rate:  72 PR Interval:    QRS Duration:  101 QT Interval:  425 QTC Calculation: 466 R Axis:   -10 Text Interpretation:  Sinus rhythm Low voltage, precordial leads Left  anterior fasicular  block since last tracing no significant change  Confirmed by Danaysia Rader  MD, Jensine Luz ([id number]) on 09/18/2015 9:36:14 PM      MDM   Final diagnoses:  Chest pain, unspecified chest pain type    Unremarkable and unchanged EKG, chest x-ray without acute findings and labs are unremarkable including a normal troponin. This is with having chest pain over the course of the majority of the day. I discussed with his cardiologist his results as well as the plan and is primary cardiologist Dr. Jacinto HalimGanji states that he will do an outpatient heart catheterization, he will call him in the morning to arrange this for the next 24-48 hours. The patient will be informed of this plan.  The patient is in agreement, he appears stable for discharge    Eber HongBrian Ashby Leflore, MD 09/18/15 2334

## 2015-09-19 DIAGNOSIS — R079 Chest pain, unspecified: Secondary | ICD-10-CM | POA: Diagnosis not present

## 2015-09-20 ENCOUNTER — Encounter: Payer: Self-pay | Admitting: Internal Medicine

## 2015-09-20 DIAGNOSIS — R0789 Other chest pain: Secondary | ICD-10-CM | POA: Diagnosis not present

## 2015-09-21 NOTE — Telephone Encounter (Signed)
Lyrica could be discontinued for the next week.

## 2015-09-21 NOTE — Telephone Encounter (Signed)
Discussed with patient, patient verbalized understanding of response  

## 2015-09-21 NOTE — Telephone Encounter (Signed)
Left message on voicemail for patient to return call when available   

## 2015-09-24 NOTE — H&P (Signed)
OFFICE VISIT NOTES COPIED TO EPIC FOR DOCUMENTATION  History of Present Illness Kenneth Page MD; 08/21/2015 6:20 PM) Patient words: FU dyspnea on exertion and resting; last ov 05/15/2015.  The patient is a 65 year old male who presents for who presents for shortness of breath.  Additional reasons for visit:  Follow-up for CAD is described as the following: He is a caucasian male with history of known coronary artery disease. He underwent PCI to promimal LAD with 3x18 mm DES and to Cx/OM1 with 3.5 x 16 mm Promus DES on 04/08/2014. He has h/o Mid LAD 2.75 x 38 mm Promus Premier drug-eluting stent, balloon angioplasty of D1 and distal LAD(03/29/2013) and Distal RCA stent (12/15/2012) with 2.75 x 24 mm promos Premier drug-eluting stents, which were widely patent during last cornary angiogram.  He called our office stating that was the past 3 weeks he has noticed gradual worsening of dyspnea on exertion associated with chest tightness. He has also noticed marked fatigue. No leg edema, no dizziness or syncope. Symptoms are present only during exertion and he states that he has to rest for 2-3 minutes before he feels better He states that symptoms are not severe enough to take nitroglycerin.  He has h/o rhabdomyalysis on statins with worsening restless leg syndrome. Has h/o uncontrolled DM and since last coronary angiography and angioplasty, he had made lifestyle changes and brought his A1c to less than 7. However he has gone back to his od habits and states that he has not been watching low-carb diet. Wife present at bedside.   Problem List/Past Medical Anderson Malta Sergeant; 08/21/2015 4:09 PM) Chest pain, cardiac (R07.9)  EKG 11/19/2012: Normal sinus rhythm @ 61/min, normal intervals, cannot exclude inferior infarct, age undetermined. Q wave in lead 3 with T wave inversion and possible Q wave in aVF with borderline low voltage. Poor R-wave progression. No significant change from 11/11/2012 Abnormal EKG  (R94.31)  Diabetes mellitus due to underlying condition, controlled, without complication, without long-term current use of insulin (E08.9)  Chest pain, atypical (R07.89)  Mixed hyperlipidemia (E78.2)  CAD S/P percutaneous coronary angioplasty (I25.10)  Labs 04/28/2014: HB 12 0.4/HCT 36.3, mildly microcytic indices, PLT 129, CBC otherwise normal, serum glucose 116, creatinine 0.97, BMP otherwise normal Coronary angiogram 05/03/2014: Proximal LAD 3.0x12 mm Promus DES. 04/08/2014: Cx/OM1 with 3.5 x 16 mm Promus DES. H/O Mid LAD 2.75 x 38 mm Promus Premier drug-eluting stent, balloon angioplasty of D1 and distal LAD(03/29/2013) and Distal RCA stent (12/15/2012) with 2.75 x 24 mm promos Premier drug-eluting stent widely patent. Restless leg syndrome (G25.81)  Dyspnea on exertion (R06.09)  History of rhabdomyolysis (Z87.39)  Statin intolerance (Z78.9)  Labwork  Labs 10/19/2014: HbA1c 6.9%. BMP was normal, blood sugar 113, potassium 4.6, serum creatinine 1.06. CBC normal. Labs 11/29/2013: HbA1c 8.1% Labs 06/23/2014: Total cholesterol 89, triglycerides 127, HDL 38, LDL 26, LDL particle # < 300, LP-IR score 49, CMP normal, HB 11.0/HCT 33.9 with microcytic indices, PLT 139, CBC otherwise normal Labs 04/04/2014: Total cholesterol 172, triglycerides 161, HDL 32, LDL 108, CBC normal, serum glucose 141, creatinine 1.08, eGFR 72, CMP otherwise normal, CK 78 Labs 11/29/2013: total cholesterol 167, triglycerides 167, HDL 34, LDL 100, creatinine 1.11, eGFR 70, potassium 4.7, CMP otherwise normal, CBC normal. GERD (gastroesophageal reflux disease) (K21.9)  Non-traumatic rhabdomyolysis (M62.82) 09/22/2013 Labs 02/28/2014: CK 170, hepatic function normal Labs 01/24/2014: CK 140 Labs 01/14/2014: CK upper limits of normal at 202 Labs 01/03/2014: CK normal at 181 Dr. Delice Lesch (Neurology): Severe restless leg  syndrome and evaluated by neurology. Statin discontinued. However patient states his symptoms of restless leg are persistent  but CK levels trendiing down, suspect Lipitor or a combination.  Allergies Anderson Malta Sergeant; 08/21/2015 4:09 PM) Statins Support *DIETARY PRODUCTS/DIETARY MANAGEMENT PRODUCTS*  Rhabdomyolisis  Family History Anderson Malta Sergeant; 08/21/2015 4:09 PM) Family history unknown - Adopted   Social History Anderson Malta Sergeant; 08/21/2015 4:09 PM) Current tobacco use  Never smoker. Non Drinker/No Alcohol Use  Marital status  Married. Number of Children  2. Living Situation  Lives with spouse.  Past Surgical History Anderson Malta Sergeant; 08/21/2015 4:09 PM) (2) right knee surgeries  1990 and 2000 Abscess in abdomen 2000 Hernia Repair 2010  Medication History Anderson Malta Sergeant; 08/21/2015 4:18 PM) Nitrostat (0.4MG Tab Sublingual, 1 (one) Tab Sub Tab Sublingua Sublingual every 5 minutes as needed for chest pain., Taken starting 08/20/2015) Active. Aspirin EC Low Strength (81MG Tablet DR, 1 (one) Ta Tablet DR Tablet Oral daily, Taken starting 05/14/2015) Active. Effient (10MG Tablet, 1 (one) Tablet Tablet Oral daily, Taken starting 05/13/2015) Active. Praluent (75MG/ML Soln Pen-inj, 1 injection Soln Pen-inj Soln Subcutaneous every 2 weeks, Taken starting 03/15/2015) Active. Carvedilol (6.25MG Tablet, 1/2 Tablet Tablet Tablet Table Oral two times daily, Taken starting 02/10/2014) Active. Lantus SoloStar (100UNIT/ML Soln Pen-inj, Sliding scale Subcutaneous) Active. Pantoprazole Sodium (40MG Tablet DR, 1 Oral two times daily) Active. Acetaminophen (500MG Tablet, 2 Oral as needed) Active. Pramipexole Dihydrochloride (1MG Tablet, 1 Oral three times daily for Restless Leg) Active. Glimepiride (1MG Tablet, 1 Oral as needed) Active. Ferrous Sulfate (325 (65 Fe)MG Tablet, 1 Oral daily) Active. Medications Reconciled (verbally)  Diagnostic Studies History Anderson Malta Sergeant; 08/21/2015 4:09 PM) Coronary Angiogram  Repeat Coronary angiogram 03/29/2013: Mid LAD 2.75 x 38 mm Promus Premier  drug-eluting stent, balloon angioplasty of D1 and distal LAD and stenting of distal RCA with 2.75 x 24 mm promos Premier drug-eluting stent on 12/15/2012 widely patent. 50-60% residual midcircumflex stenosis. Not significant by FFR. Nuclear stress test  Exercise Myoview stress test 11/20/2012: 1. Resting EKG showed normal sinus rhythm, poor R wave progression, Stress EKG was positive for ischemia at 80% of MPHR work-load. There was 1.5 mm ST depression in inferior and lateral leads noted at peak exercise, which resolved at > 3 minutes into recovery. Patient exercised on Bruce protocol for 5 minutes 46 secs. The maximum work level achieved was 7.3 MET's. The test was terminated due to chest pain. 2. The perfusion imaging study demonstrates mild diaphragmatic attenuation artifact with superimposed mild ischemia in the inferior wall which does not reach statistical significance on polar plot images. The size of the defect is small. Dynamic gated images reveal normal wall motion and endocardial thickening. Left ventricular ejection fraction was estimated to be 61%. 3. This study is abnormal. Patient had chest discomfort along with stress EKG abnormalities suggestive of ischemia and the test was terminated prematurely due to chest discomfort. Although the perfusion imaging study demonstrates very small defect, clinical correlation is highly recommended. Consider further cardiac workup including cardiac catheterization if clinically indicated. This test would be at least a intermediate risk study. Colonoscopy 2006 Normal. Echocardiogram 10/07/2013 1. Left ventricle cavity is normal in size. Normal global wall motion. Normal systolic function, calculated EF- 66%. Doppler evidence of grade I (impaired) diastolic dysfunction. 2. Tricuspid aortic valve with mild regurgitation. Mild aortic valve leaflet thickening. 3. Trace mitral regurgitation. 4. Trace tricuspid regurgitation. No evidence of pulmonary hypertension.  11/12/2012: [place]: Normal LV size, normal left ventricular systolic function. No wall motion abnormality.  No significant alveolar abnormality. Endoscopy 12/13/2014 2006 Dx'd with Barrett's Esophagus.  Other Problems Anderson Malta Sergeant; 08/21/2015 4:09 PM) Pre-operative cardiovascular examination (Z01.810)     Review of Systems Kenneth Page MD; 08/21/2015 6:17 PM) General Present- Fatigue. Not Present- Fever and Night Sweats. Skin Not Present- Itching and Rash. HEENT Not Present- Headache. Respiratory Present- Decreased Exercise Tolerance. Not Present- Snoring, Wakes up from Sleep Wheezing or Short of Breath and Wheezing. Cardiovascular Present- Chest Pain. Not Present- Claudications, Fainting, Orthopnea and Swelling of Extremities. Gastrointestinal Not Present- Abdominal Pain, Constipation, Diarrhea, Nausea and Vomiting. Musculoskeletal Present- Leg Cramps (restless legs). Not Present- Joint Swelling. Neurological Present- Muscle Twitching (restless legs), Paresthesias and Tremor. Hematology Not Present- Blood Clots, Easy Bleeding and Nose Bleed.  Vitals Anderson Malta Sergeant; 08/21/2015 4:21 PM) 08/21/2015 4:10 PM Weight: 184.06 lb Height: 68in Body Surface Area: 1.97 m Body Mass Index: 27.99 kg/m  Pulse: 71 (Regular)  P.OX: 98% (Room air) BP: 110/68 (Sitting, Left Arm, Standard)       Physical Exam Kenneth Page, MD; 08/21/2015 6:20 PM) General Mental Status-Alert. General Appearance-Cooperative, Appears stated age, Not in acute distress. Orientation-Oriented X3. Build & Nutrition-Well nourished and Moderately built.  Head and Neck Thyroid Gland Characteristics - no palpable nodules, no palpable enlargement.  Chest and Lung Exam Palpation Tender - No chest wall tenderness. Auscultation Breath sounds - Clear.  Cardiovascular Inspection Jugular vein - Right - No Distention. Auscultation Heart Sounds - S1 WNL, S2 WNL and No gallop  present. Murmurs & Other Heart Sounds - Murmur - No murmur.  Abdomen Palpation/Percussion Normal exam - Non Tender and No hepatosplenomegaly. Auscultation Normal exam - Bowel sounds normal.  Peripheral Vascular Lower Extremity Inspection - Left - No Pigmentation, No Varicose veins. Right - No Pigmentation, No Varicose veins. Palpation - Edema - Left - No edema. Right - No edema. Femoral pulse - Left - Normal. Right - Normal. Popliteal pulse - Left - Normal. Right - Normal. Dorsalis pedis pulse - Left - Normal. Right - Normal. Posterior tibial pulse - Left - Normal. Right - Normal. Carotid arteries - Left-No Carotid bruit. Carotid arteries - Right-No Carotid bruit. Abdomen-No prominent abdominal aortic pulsation, No epigastric bruit.  Neurologic Motor-Grossly intact without any focal deficits.  Musculoskeletal Global Assessment Left Lower Extremity - normal range of motion without pain. Right Lower Extremity - normal range of motion without pain.    Assessment & Plan Kenneth Page MD; 08/23/2015 6:56 PM) CAD S/P percutaneous coronary angioplasty (I25.10)  Coronary angiogram 05/03/2014: Proximal LAD 3.0x12 mm Promus DES. 04/08/2014: Cx/OM1 with 3.5 x 16 mm Promus DES. H/O Mid LAD 2.75 x 38 mm Promus Premier drug-eluting stent, balloon angioplasty of D1 and distal LAD(03/29/2013) and Distal RCA stent (12/15/2012) with 2.75 x 24 mm promos Premier drug-eluting stent widely patent. Impression: EKG 08/21/2015: Normal sinus rhythm at rate of 71 bpm, left axis deviation, low-voltage complexes otherwise normal EKG. No significant change from EKG 04/14/2015, LAD new.  Exercise myoview stress 08/28/2015: 1. The resting electrocardiogram demonstrated normal sinus rhythm, normal resting conduction, no resting arrhythmias and normal rest repolarization.  Stress EKG is equivocal for ischemia, there was 2 mm upsloping ST segment depression noted with peak exercise, reversed back to baseline at 2  minute recovery. The stress test was terminated because of fatigue. Patient exercised on Bruce protocol for 7:22 minutes and achieved 8.57 METS. Stress test terminated due to target heart rate( 94% MPHR).  2. Myocardial perfusion imaging is normal. Overall left ventricular systolic function  was normal without regional wall motion abnormalities. The left ventricular ejection fraction was 59%.  This is a low risk study. clinical correlation recommended.  Compared to the study done on 11/20/2012, stress induced chest pain not present, inferior wall ischemia not present.  Echocardiogram 08/31/2015: Left ventricle cavity is normal in size. Normal global wall motion. Doppler evidence of grade I (impaired) diastolic dysfunction. Calculated EF 58%. Left atrial cavity is mildly dilated. Mild (Grade I) aortic regurgitation. Mild (Grade I) mitral regurgitation. Mild tricuspid regurgitation. No evidence of pulmonary hypertension. No significant change since 10/07/2013.  EKG 05/12/2014: Sinus bradycardia at the rate of 56 bpm, leftward axis, cannot exclude inferior infarct old, poor R-wave progression, probably pulmonary disease pattern. No evidence of ischemia. No significant change 04/15/2014. Current Plans Complete electrocardiogram (93000) Angina pectoris (I20.9) Shortness of breath on exertion (R06.02) Mixed hyperlipidemia (E78.2) Diabetic peripheral neuropathy (E11.42) Current Plans Started L-Methylfolate-Algae-B12-B6 3-90.314-2-35MG, 1 (one) Capsule two times daily, Mail Order #180, 90 days starting 08/23/2015, Ref. x1. Mail Order: Disregard [facility] Labwork Labs 09/20/2015: Serum glucose 120 mg, BUN 19, serum creatinine 1.19. EGFR 63 mL. HB 10.3/HCT 31.3, microcytic indicis. RDW 16.1. Pro time normal at 10.4 seconds. Labs 07/03/2015: HbA1c 7.4%. Serum glucose 147, BUN 16, serum creatinine 1.07. Potassium 4.8. CMP otherwise normal. Total cholesterol 86, triglycerides 149, HDL 37, LDL 19.  Labs 10/19/2014:  HbA1c 6.9%. BMP was normal, blood sugar 113, potassium 4.6, serum creatinine 1.06. CBC normal. Labs 11/29/2013: HbA1c 8.1%  Labs 06/23/2014: Total cholesterol 89, triglycerides 127, HDL 38, LDL 26, LDL particle # < 300, LP-IR score 49, CMP normal, HB 11.0/HCT 33.9 with microcytic indices, PLT 139, CBC otherwise normal  Labs 04/04/2014: Total cholesterol 172, triglycerides 161, HDL 32, LDL 108, CBC normal, serum glucose 141, creatinine 1.08, eGFR 72, CMP otherwise normal, CK 78  Labs 11/29/2013: total cholesterol 167, triglycerides 167, HDL 34, LDL 100, creatinine 1.11, eGFR 70, potassium 4.7, CMP otherwise normal, CBC normal.   Current Plans Mechanism of underlying disease process and action of medications discussed with the patient. I discussed primary/secondary prevention and also dietary counseling was done. Patient seen on an urgent basis for new onset of shortness of breath and dyspnea on exertion associated with chest tightness suggestive of angina pectoris but not severe enough to take sublingual nitroglycerin. I have again discussed with him regarding making lifestyle changes, on his last office visit his A1c was well controlled, noted again uncontrolled.  Patient with recurrent admission to the ED and office calls with chest pain, due to this we discussed over the phone regarding proceeding with coronary angiogram. Stress test shows abnormal EKG, but normal perfusion, low risk. Proceed with coronary angiogram.   Schedule for cardiac catheterization, and possible angioplasty. We discussed regarding risks, benefits, alternatives to this including stress testing, CTA and continued medical therapy. Patient wants to proceed. Understands <1-2% risk of death, stroke, MI, urgent CABG, bleeding, infection, renal failure but not limited to these.  Signed by Kenneth Page, MD (08/23/2015 6:56 PM)

## 2015-09-25 ENCOUNTER — Encounter (HOSPITAL_COMMUNITY)
Admission: RE | Disposition: A | Discharge status: Home / Self Care | Payer: Self-pay | Source: Ambulatory Visit | Attending: Cardiology

## 2015-09-25 ENCOUNTER — Ambulatory Visit (HOSPITAL_COMMUNITY)
Admission: RE | Admit: 2015-09-25 | Discharge: 2015-09-25 | Disposition: A | Discharge status: Home / Self Care | Payer: Medicare Other | Source: Ambulatory Visit | Attending: Cardiology | Admitting: Cardiology

## 2015-09-25 DIAGNOSIS — I2510 Atherosclerotic heart disease of native coronary artery without angina pectoris: Secondary | ICD-10-CM | POA: Diagnosis present

## 2015-09-25 DIAGNOSIS — Z955 Presence of coronary angioplasty implant and graft: Secondary | ICD-10-CM | POA: Diagnosis not present

## 2015-09-25 DIAGNOSIS — K219 Gastro-esophageal reflux disease without esophagitis: Secondary | ICD-10-CM | POA: Insufficient documentation

## 2015-09-25 DIAGNOSIS — Z7982 Long term (current) use of aspirin: Secondary | ICD-10-CM | POA: Insufficient documentation

## 2015-09-25 DIAGNOSIS — Z9861 Coronary angioplasty status: Secondary | ICD-10-CM | POA: Diagnosis not present

## 2015-09-25 DIAGNOSIS — I10 Essential (primary) hypertension: Secondary | ICD-10-CM | POA: Diagnosis not present

## 2015-09-25 DIAGNOSIS — I209 Angina pectoris, unspecified: Secondary | ICD-10-CM | POA: Diagnosis not present

## 2015-09-25 DIAGNOSIS — E782 Mixed hyperlipidemia: Secondary | ICD-10-CM | POA: Insufficient documentation

## 2015-09-25 DIAGNOSIS — I25118 Atherosclerotic heart disease of native coronary artery with other forms of angina pectoris: Secondary | ICD-10-CM | POA: Diagnosis not present

## 2015-09-25 DIAGNOSIS — Z8739 Personal history of other diseases of the musculoskeletal system and connective tissue: Secondary | ICD-10-CM | POA: Insufficient documentation

## 2015-09-25 DIAGNOSIS — E1142 Type 2 diabetes mellitus with diabetic polyneuropathy: Secondary | ICD-10-CM | POA: Insufficient documentation

## 2015-09-25 DIAGNOSIS — G2581 Restless legs syndrome: Secondary | ICD-10-CM | POA: Diagnosis not present

## 2015-09-25 DIAGNOSIS — R079 Chest pain, unspecified: Secondary | ICD-10-CM | POA: Diagnosis present

## 2015-09-25 HISTORY — PX: CARDIAC CATHETERIZATION: SHX172

## 2015-09-25 LAB — GLUCOSE, CAPILLARY
GLUCOSE-CAPILLARY: 134 mg/dL — AB (ref 65–99)
GLUCOSE-CAPILLARY: 89 mg/dL (ref 65–99)

## 2015-09-25 LAB — POCT ACTIVATED CLOTTING TIME: ACTIVATED CLOTTING TIME: 235 s

## 2015-09-25 SURGERY — LEFT HEART CATH AND CORONARY ANGIOGRAPHY

## 2015-09-25 MED ORDER — HYDROMORPHONE HCL 1 MG/ML IJ SOLN
INTRAMUSCULAR | Status: AC
  Filled 2015-09-25: qty 1

## 2015-09-25 MED ORDER — NITROGLYCERIN 1 MG/10 ML FOR IR/CATH LAB
INTRA_ARTERIAL | Status: AC
  Filled 2015-09-25: qty 10

## 2015-09-25 MED ORDER — PRASUGREL HCL 10 MG PO TABS
10.0000 mg | ORAL_TABLET | Freq: Every day | ORAL | Status: AC
  Administered 2015-09-25: 10 mg via ORAL

## 2015-09-25 MED ORDER — HEPARIN SODIUM (PORCINE) 1000 UNIT/ML IJ SOLN
INTRAMUSCULAR | Status: AC
  Filled 2015-09-25: qty 1

## 2015-09-25 MED ORDER — PRASUGREL HCL 10 MG PO TABS
ORAL_TABLET | ORAL | Status: AC
  Filled 2015-09-25: qty 1

## 2015-09-25 MED ORDER — MIDAZOLAM HCL 2 MG/2ML IJ SOLN
INTRAMUSCULAR | Status: DC | PRN
  Administered 2015-09-25: 2 mg via INTRAVENOUS

## 2015-09-25 MED ORDER — SODIUM CHLORIDE 0.9 % WEIGHT BASED INFUSION: 3.0000 mL/kg/h | INTRAVENOUS | Status: AC

## 2015-09-25 MED ORDER — MIDAZOLAM HCL 2 MG/2ML IJ SOLN
INTRAMUSCULAR | Status: AC
  Filled 2015-09-25: qty 2

## 2015-09-25 MED ORDER — SODIUM CHLORIDE 0.9% FLUSH: 3.0000 mL | INTRAVENOUS | Status: DC | PRN

## 2015-09-25 MED ORDER — SODIUM CHLORIDE 0.9% FLUSH: 3.0000 mL | Freq: Two times a day (BID) | INTRAVENOUS | Status: DC

## 2015-09-25 MED ORDER — HEPARIN (PORCINE) IN NACL 2-0.9 UNIT/ML-% IJ SOLN
INTRAMUSCULAR | Status: DC | PRN
  Administered 2015-09-25: 1000 mL

## 2015-09-25 MED ORDER — LIDOCAINE HCL (PF) 1 % IJ SOLN
INTRAMUSCULAR | Status: DC | PRN
  Administered 2015-09-25: 2 mL via SUBCUTANEOUS

## 2015-09-25 MED ORDER — ADENOSINE 12 MG/4ML IV SOLN
INTRAVENOUS | Status: AC
  Filled 2015-09-25: qty 16

## 2015-09-25 MED ORDER — IOPAMIDOL (ISOVUE-370) INJECTION 76%
INTRAVENOUS | Status: AC
  Filled 2015-09-25: qty 100

## 2015-09-25 MED ORDER — SODIUM CHLORIDE 0.9 % WEIGHT BASED INFUSION
3.0000 mL/kg/h | INTRAVENOUS | Status: AC
  Administered 2015-09-25: 3 mL/kg/h via INTRAVENOUS

## 2015-09-25 MED ORDER — SODIUM CHLORIDE 0.9 % IV SOLN: 250.0000 mL | INTRAVENOUS | Status: DC | PRN

## 2015-09-25 MED ORDER — HEPARIN (PORCINE) IN NACL 2-0.9 UNIT/ML-% IJ SOLN
INTRAMUSCULAR | Status: AC
  Filled 2015-09-25: qty 1000

## 2015-09-25 MED ORDER — HEPARIN SODIUM (PORCINE) 1000 UNIT/ML IJ SOLN
INTRAMUSCULAR | Status: DC | PRN
  Administered 2015-09-25: 3000 [IU] via INTRAVENOUS
  Administered 2015-09-25: 4000 [IU] via INTRAVENOUS

## 2015-09-25 MED ORDER — ADENOSINE (DIAGNOSTIC) 140MCG/KG/MIN
140.0000 ug/kg/min | Freq: Once | INTRAVENOUS | Status: AC
  Administered 2015-09-25: 140 ug/kg/min via INTRAVENOUS

## 2015-09-25 MED ORDER — VERAPAMIL HCL 2.5 MG/ML IV SOLN
INTRA_ARTERIAL | Status: DC | PRN
  Administered 2015-09-25: 10 mL via INTRA_ARTERIAL

## 2015-09-25 MED ORDER — VERAPAMIL HCL 2.5 MG/ML IV SOLN
INTRAVENOUS | Status: AC
  Filled 2015-09-25: qty 2

## 2015-09-25 MED ORDER — IOPAMIDOL (ISOVUE-370) INJECTION 76%
INTRAVENOUS | Status: DC | PRN
  Administered 2015-09-25: 70 mL via INTRA_ARTERIAL

## 2015-09-25 MED ORDER — SODIUM CHLORIDE 0.9 % WEIGHT BASED INFUSION: 1.0000 mL/kg/h | INTRAVENOUS | Status: DC

## 2015-09-25 MED ORDER — LIDOCAINE HCL (PF) 1 % IJ SOLN
INTRAMUSCULAR | Status: AC
  Filled 2015-09-25: qty 30

## 2015-09-25 MED ORDER — HYDROMORPHONE HCL 1 MG/ML IJ SOLN
INTRAMUSCULAR | Status: DC | PRN
  Administered 2015-09-25: 0.5 mg via INTRAVENOUS

## 2015-09-25 MED ORDER — ASPIRIN 81 MG PO CHEW: 81.0000 mg | CHEWABLE_TABLET | ORAL | Status: DC

## 2015-09-25 SURGICAL SUPPLY — 12 items
CATH OPTITORQUE TIG 4.0 5F (CATHETERS) ×2 IMPLANT
COVER PRB 48X5XTLSCP FOLD TPE (BAG) ×1 IMPLANT
COVER PROBE 5X48 (BAG) ×1
DEVICE RAD COMP TR BAND LRG (VASCULAR PRODUCTS) ×2 IMPLANT
GLIDESHEATH SLEND A-KIT 6F 20G (SHEATH) ×2 IMPLANT
GUIDEWIRE PRESSURE COMET II (WIRE) ×2 IMPLANT
KIT ESSENTIALS PG (KITS) ×2 IMPLANT
KIT HEART LEFT (KITS) ×2 IMPLANT
PACK CARDIAC CATHETERIZATION (CUSTOM PROCEDURE TRAY) ×2 IMPLANT
TRANSDUCER W/STOPCOCK (MISCELLANEOUS) ×2 IMPLANT
TUBING CIL FLEX 10 FLL-RA (TUBING) ×2 IMPLANT
WIRE SAFE-T 1.5MM-J .035X260CM (WIRE) ×2 IMPLANT

## 2015-09-25 NOTE — Discharge Instructions (Signed)
Radial Site Care °Refer to this sheet in the next few weeks. These instructions provide you with information about caring for yourself after your procedure. Your health care provider may also give you more specific instructions. Your treatment has been planned according to current medical practices, but problems sometimes occur. Call your health care provider if you have any problems or questions after your procedure. °WHAT TO EXPECT AFTER THE PROCEDURE °After your procedure, it is typical to have the following: °· Bruising at the radial site that usually fades within 1-2 weeks. °· Blood collecting in the tissue (hematoma) that may be painful to the touch. It should usually decrease in size and tenderness within 1-2 weeks. °HOME CARE INSTRUCTIONS °· Take medicines only as directed by your health care provider. °· You may shower 24-48 hours after the procedure or as directed by your health care provider. Remove the bandage (dressing) and gently wash the site with plain soap and water. Pat the area dry with a clean towel. Do not rub the site, because this may cause bleeding. °· Do not take baths, swim, or use a hot tub until your health care provider approves. °· Check your insertion site every day for redness, swelling, or drainage. °· Do not apply powder or lotion to the site. °· Do not flex or bend the affected arm for 24 hours or as directed by your health care provider. °· Do not push or pull heavy objects with the affected arm for 24 hours or as directed by your health care provider. °· Do not lift over 10 lb (4.5 kg) for 5 days after your procedure or as directed by your health care provider. °· Ask your health care provider when it is okay to: °¨ Return to work or school. °¨ Resume usual physical activities or sports. °¨ Resume sexual activity. °· Do not drive home if you are discharged the same day as the procedure. Have someone else drive you. °· You may drive 24 hours after the procedure unless otherwise  instructed by your health care provider. °· Do not operate machinery or power tools for 24 hours after the procedure. °· If your procedure was done as an outpatient procedure, which means that you went home the same day as your procedure, a responsible adult should be with you for the first 24 hours after you arrive home. °· Keep all follow-up visits as directed by your health care provider. This is important. °SEEK MEDICAL CARE IF: °· You have a fever. °· You have chills. °· You have increased bleeding from the radial site. Hold pressure on the site. °SEEK IMMEDIATE MEDICAL CARE IF: °· You have unusual pain at the radial site. °· You have redness, warmth, or swelling at the radial site. °· You have drainage (other than a small amount of blood on the dressing) from the radial site. °· The radial site is bleeding, and the bleeding does not stop after 30 minutes of holding steady pressure on the site. °· Your arm or hand becomes pale, cool, tingly, or numb. °  °This information is not intended to replace advice given to you by your health care provider. Make sure you discuss any questions you have with your health care provider. °  °Document Released: 03/23/2010 Document Revised: 03/11/2014 Document Reviewed: 09/06/2013 °Elsevier Interactive Patient Education ©2016 [employer] ° °

## 2015-09-25 NOTE — Interval H&P Note (Signed)
History and Physical Interval Note:  09/25/2015 8:23 AM  Kenneth Sic Sr.  has presented today for surgery, with the diagnosis of cp  The various methods of treatment have been discussed with the patient and family. After consideration of risks, benefits and other options for treatment, the patient has consented to  Procedure(s): Left Heart Cath and Coronary Angiography (N/A) and possible PCI  as a surgical intervention .  The patient's history has been reviewed, patient examined, no change in status, stable for surgery.  I have reviewed the patient's chart and labs.  Questions were answered to the patient's satisfaction.   Ischemic Symptoms? CCS III (Marked limitation of ordinary activity) Anti-ischemic Medical Therapy? Maximal Medical Therapy (2 or more classes of medications) Non-invasive Test Results? Intermediate-risk stress test findings: cardiac mortality 1-3%/year Prior CABG? No Previous CABG   Patient Information:   1-2V CAD, no prox LAD  A (8)  Indication: 17; Score: 8   Patient Information:   CTO of 1 vessel, no other CAD  A (7)  Indication: 27; Score: 7   Patient Information:   1V CAD with prox LAD  A (9)  Indication: 33; Score: 9   Patient Information:   2V-CAD with prox LAD  A (9)  Indication: 39; Score: 9   Patient Information:   3V-CAD without LMCA  A (9)  Indication: 45; Score: 9   Patient Information:   3V-CAD without LMCA With Abnormal LV systolic function  A (9)  Indication: 48; Score: 9   Patient Information:   LMCA-CAD  A (9)  Indication: 49; Score: 9   Patient Information:   2V-CAD with prox LAD PCI  A (7)  Indication: 62; Score: 7   Patient Information:   2V-CAD with prox LAD CABG  A (8)  Indication: 62; Score: 8   Patient Information:   3V-CAD without LMCA With Low CAD burden(i.e., 3 focal stenoses, low SYNTAX score) PCI  A (7)  Indication: 63; Score: 7   Patient Information:   3V-CAD without LMCA With  Low CAD burden(i.e., 3 focal stenoses, low SYNTAX score) CABG  A (9)  Indication: 63; Score: 9   Patient Information:   3V-CAD without LMCA E06c - Intermediate-high CAD burden (i.e., multiple diffuse lesions, presence of CTO, or high SYNTAX score) PCI  U (4)  Indication: 64; Score: 4   Patient Information:   3V-CAD without LMCA E06c - Intermediate-high CAD burden (i.e., multiple diffuse lesions, presence of CTO, or high SYNTAX score) CABG  A (9)  Indication: 64; Score: 9   Patient Information:   LMCA-CAD With Isolated LMCA stenosis  PCI  U (6)  Indication: 65; Score: 6   Patient Information:   LMCA-CAD With Isolated LMCA stenosis  CABG  A (9)  Indication: 65; Score: 9   Patient Information:   LMCA-CAD Additional CAD, low CAD burden (i.e., 1- to 2-vessel additional involvement, low SYNTAX score) PCI  U (5)  Indication: 66; Score: 5   Patient Information:   LMCA-CAD Additional CAD, low CAD burden (i.e., 1- to 2-vessel additional involvement, low SYNTAX score) CABG  A (9)  Indication: 66; Score: 9   Patient Information:   LMCA-CAD Additional CAD, intermediate-high CAD burden (i.e., 3-vessel involvement, presence of CTO, or high SYNTAX score) PCI  I (3)  Indication: 67; Score: 3   Patient Information:   LMCA-CAD Additional CAD, intermediate-high CAD burden (i.e., 3-vessel involvement, presence of CTO, or high SYNTAX score) CABG  A (9)  Indication: 67; Score: 9  Adrian Prows

## 2015-09-26 ENCOUNTER — Encounter (HOSPITAL_COMMUNITY): Payer: Self-pay | Admitting: Cardiology

## 2015-09-26 NOTE — Addendum Note (Signed)
Addended by: SIMPSON, MESHELL A on: 09/26/2015 04:41 PM   Modules accepted: Orders  

## 2015-09-27 ENCOUNTER — Telehealth: Payer: Self-pay | Admitting: *Deleted

## 2015-09-27 DIAGNOSIS — R0600 Dyspnea, unspecified: Secondary | ICD-10-CM

## 2015-09-27 NOTE — Telephone Encounter (Signed)
Patient called and stated that his Breathing is getting worse. He stated that he can walk a few steps and he is out of breath. He stated that he has seen a Cardiologist, had chest x-rays and has been to the ER twice for this and everything checks out fine, but he feels like he is getting worse. Patient is requesting a referral to a Pulmonologist at [facility] to be evaluated. Please Advise. (patient is aware [name] is out of the office till next week).

## 2015-09-29 DIAGNOSIS — G2581 Restless legs syndrome: Secondary | ICD-10-CM | POA: Diagnosis not present

## 2015-10-04 DIAGNOSIS — Z9861 Coronary angioplasty status: Secondary | ICD-10-CM | POA: Diagnosis not present

## 2015-10-04 DIAGNOSIS — I209 Angina pectoris, unspecified: Secondary | ICD-10-CM | POA: Diagnosis not present

## 2015-10-04 DIAGNOSIS — R0602 Shortness of breath: Secondary | ICD-10-CM | POA: Diagnosis not present

## 2015-10-04 DIAGNOSIS — I2510 Atherosclerotic heart disease of native coronary artery without angina pectoris: Secondary | ICD-10-CM | POA: Diagnosis not present

## 2015-10-04 NOTE — Telephone Encounter (Signed)
Order placed and patient notified 

## 2015-10-04 NOTE — Telephone Encounter (Signed)
Refer to pulmonary specialist at [facility] as requested. Reason for referral is dyspnea.

## 2015-10-05 ENCOUNTER — Other Ambulatory Visit: Payer: Self-pay

## 2015-10-05 DIAGNOSIS — J209 Acute bronchitis, unspecified: Secondary | ICD-10-CM | POA: Diagnosis not present

## 2015-10-05 DIAGNOSIS — R7989 Other specified abnormal findings of blood chemistry: Secondary | ICD-10-CM

## 2015-10-05 DIAGNOSIS — I10 Essential (primary) hypertension: Secondary | ICD-10-CM

## 2015-10-05 DIAGNOSIS — E1151 Type 2 diabetes mellitus with diabetic peripheral angiopathy without gangrene: Secondary | ICD-10-CM

## 2015-10-05 DIAGNOSIS — R0602 Shortness of breath: Secondary | ICD-10-CM | POA: Diagnosis not present

## 2015-10-05 DIAGNOSIS — E785 Hyperlipidemia, unspecified: Secondary | ICD-10-CM

## 2015-10-05 DIAGNOSIS — D509 Iron deficiency anemia, unspecified: Secondary | ICD-10-CM

## 2015-10-09 ENCOUNTER — Other Ambulatory Visit: Payer: Medicare Other

## 2015-10-09 DIAGNOSIS — D509 Iron deficiency anemia, unspecified: Secondary | ICD-10-CM

## 2015-10-09 DIAGNOSIS — I10 Essential (primary) hypertension: Secondary | ICD-10-CM

## 2015-10-09 DIAGNOSIS — R7989 Other specified abnormal findings of blood chemistry: Secondary | ICD-10-CM

## 2015-10-09 DIAGNOSIS — R748 Abnormal levels of other serum enzymes: Secondary | ICD-10-CM | POA: Diagnosis not present

## 2015-10-09 DIAGNOSIS — E785 Hyperlipidemia, unspecified: Secondary | ICD-10-CM | POA: Diagnosis not present

## 2015-10-09 DIAGNOSIS — R946 Abnormal results of thyroid function studies: Secondary | ICD-10-CM | POA: Diagnosis not present

## 2015-10-09 DIAGNOSIS — E1151 Type 2 diabetes mellitus with diabetic peripheral angiopathy without gangrene: Secondary | ICD-10-CM | POA: Diagnosis not present

## 2015-10-09 LAB — CBC WITH DIFFERENTIAL/PLATELET
BASOS ABS: 0 {cells}/uL (ref 0–200)
Basophils Relative: 0 %
EOS ABS: 144 {cells}/uL (ref 15–500)
EOS PCT: 3 %
HCT: 30.1 % — ABNORMAL LOW (ref 38.5–50.0)
HEMOGLOBIN: 9.2 g/dL — AB (ref 13.2–17.1)
LYMPHS ABS: 1248 {cells}/uL (ref 850–3900)
Lymphocytes Relative: 26 %
MCH: 22 pg — AB (ref 27.0–33.0)
MCHC: 30.6 g/dL — AB (ref 32.0–36.0)
MCV: 72 fL — AB (ref 80.0–100.0)
MONOS PCT: 9 %
MPV: 10.1 fL (ref 7.5–12.5)
Monocytes Absolute: 432 cells/uL (ref 200–950)
NEUTROS PCT: 62 %
Neutro Abs: 2976 cells/uL (ref 1500–7800)
Platelets: 133 10*3/uL — ABNORMAL LOW (ref 140–400)
RBC: 4.18 MIL/uL — ABNORMAL LOW (ref 4.20–5.80)
RDW: 16.1 % — ABNORMAL HIGH (ref 11.0–15.0)
WBC: 4.8 10*3/uL (ref 3.8–10.8)

## 2015-10-09 LAB — IRON AND TIBC
%SAT: 8 % — AB (ref 15–60)
IRON: 24 ug/dL — AB (ref 50–180)
TIBC: 308 ug/dL (ref 250–425)
UIBC: 284 ug/dL (ref 125–400)

## 2015-10-09 LAB — FERRITIN: Ferritin: 28 ng/mL (ref 20–380)

## 2015-10-09 LAB — COMPLETE METABOLIC PANEL WITH GFR
ALBUMIN: 3.8 g/dL (ref 3.6–5.1)
ALK PHOS: 60 U/L (ref 40–115)
ALT: 18 U/L (ref 9–46)
AST: 20 U/L (ref 10–35)
BUN: 13 mg/dL (ref 7–25)
CHLORIDE: 105 mmol/L (ref 98–110)
CO2: 28 mmol/L (ref 20–31)
Calcium: 9.1 mg/dL (ref 8.6–10.3)
Creat: 0.97 mg/dL (ref 0.70–1.25)
GFR, EST NON AFRICAN AMERICAN: 81 mL/min (ref >=60)
GFR, Est African American: >89 mL/min (ref >=60)
GLUCOSE: 113 mg/dL — AB (ref 65–99)
POTASSIUM: 4.7 mmol/L (ref 3.5–5.3)
SODIUM: 139 mmol/L (ref 135–146)
Total Bilirubin: 0.4 mg/dL (ref 0.2–1.2)
Total Protein: 6.4 g/dL (ref 6.1–8.1)

## 2015-10-09 LAB — RETICULOCYTES
ABS Retic: 71060 cells/uL (ref 25000–90000)
RBC.: 4.18 MIL/uL — ABNORMAL LOW (ref 4.20–5.80)
RETIC CT PCT: 1.7 %

## 2015-10-09 LAB — MICROALBUMIN, URINE: Microalb, Ur: <0.2 mg/dL

## 2015-10-09 LAB — TSH: TSH: 2.65 m[IU]/L (ref 0.40–4.50)

## 2015-10-09 LAB — CK: CK TOTAL: 255 U/L — AB (ref 7–232)

## 2015-10-10 LAB — HEMOGLOBIN A1C
HEMOGLOBIN A1C: 7 % — AB (ref <5.7)
MEAN PLASMA GLUCOSE: 154 mg/dL

## 2015-10-11 ENCOUNTER — Ambulatory Visit (INDEPENDENT_AMBULATORY_CARE_PROVIDER_SITE_OTHER): Payer: Medicare Other | Admitting: Internal Medicine

## 2015-10-11 ENCOUNTER — Encounter: Payer: Self-pay | Admitting: Internal Medicine

## 2015-10-11 VITALS — BP 140/82 | HR 56 | Temp 97.5°F | Ht 68.0 in | Wt 179.0 lb

## 2015-10-11 DIAGNOSIS — E1151 Type 2 diabetes mellitus with diabetic peripheral angiopathy without gangrene: Secondary | ICD-10-CM | POA: Diagnosis not present

## 2015-10-11 DIAGNOSIS — I10 Essential (primary) hypertension: Secondary | ICD-10-CM | POA: Diagnosis not present

## 2015-10-11 DIAGNOSIS — R748 Abnormal levels of other serum enzymes: Secondary | ICD-10-CM

## 2015-10-11 DIAGNOSIS — D509 Iron deficiency anemia, unspecified: Secondary | ICD-10-CM | POA: Diagnosis not present

## 2015-10-11 DIAGNOSIS — R0602 Shortness of breath: Secondary | ICD-10-CM

## 2015-10-11 DIAGNOSIS — G2581 Restless legs syndrome: Secondary | ICD-10-CM

## 2015-10-11 MED ORDER — FERROUS SULFATE 325 (65 FE) MG PO TABS: ORAL_TABLET | ORAL | 3 refills | Status: DC

## 2015-10-11 NOTE — Progress Notes (Signed)
Facility  [place]    Place of Service:   OFFICE    Allergies  Allergen Reactions  . Lyrica [Pregabalin] Other (See Comments)    Made patient very lethargic the next morning, very hard to patient to function, move, etc.   . Statins Other (See Comments)    Causes Restless Legs, rhabdomyolysis    Chief Complaint  Patient presents with  . Medical Management of Chronic Issues    4 month medication management blood sugar, blood pressure, cholesterol  . Shortness of Breath    came into office was SOB in waiting room, put patient in wheel chart  took to exam room, soon return to normal. This has been happening for 3 months. 09/25/15 Dr. Einar Gip did Cardiac Cath.    HPI:  Shortness of breath - patient has appointment scheduled with Dr. Chase Caller 10/27/2015. He is short of breath with exertion but is comfortable at rest. Denies cough. No fever or chills. He has a poor appetite. Weight has fallen by 5 pounds over the last 6 weeks.  Anemia, iron deficiency - hemoglobin was normal at 13.5 in February 2017. When he was hospitalized in July 2017 hemoglobin had dropped to 9.2 g percent. It is remained at this level on 2 other blood checks. Patient denies seeing blood in stool or having black or tarry stools. He did have some chest pain and has a previous history of Barrett's esophagus on EGD about 2010. Reports are not in the [facility] record. He had a colonoscopy in 2010 by Dr. Jim Desanctis which showed only hemorrhoids.  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    Medications: Patient's Medications  New Prescriptions   No medications on file  Previous Medications   ACETAMINOPHEN (TYLENOL) 500 MG TABLET    Take 1,000-1,500 mg by mouth every 6 (six) hours as needed for headache.   ALIROCUMAB  (PRALUENT) 75 MG/ML SOPN    Inject 1 application into the skin every 14 (fourteen) days.   ASPIRIN 81 MG EC TABLET    Take 1 tablet (81 mg total) by mouth daily.   CARVEDILOL (COREG) 6.25 MG TABLET    Take 0.5 tablets (3.125 mg total) by mouth 2 (two) times daily with a meal.   CLONAZEPAM (KLONOPIN) 0.5 MG TABLET    One up to 4 times in 24 hours to help control restless legs   GABAPENTIN (NEURONTIN) 100 MG CAPSULE    Take 100 mg by mouth 3 (three) times daily.   GLIMEPIRIDE (AMARYL) 1 MG TABLET    Take 1 tablet (1 mg total) by mouth daily with breakfast. To control diabetes   L-METHYLFOLATE-ALGAE-B12-B6 (FOLTANX RF) 3-90.314-2-35 MG CAPS    Take one capsule twice daily for multiple vitamin   LANTUS SOLOSTAR 100 UNIT/ML SOLOSTAR PEN    INJECT 20 UNITS SUBCUTANEOUSLY IN THE MORNING AND 24 UNITS IN THE EVENING FOR DIABETES   NITROGLYCERIN (NITROSTAT) 0.4 MG SL TABLET    Place 0.4 mg under the tongue every 5 (five) minutes as needed for chest pain.   PANTOPRAZOLE (PROTONIX) 40 MG TABLET    Take one tablet by mouth twice daily for stomach   PRAMIPEXOLE (MIRAPEX) 1 MG TABLET    TAKE 1 TABLET BY  MOUTH TWICE A DAY TO HELP WITH RESTLESS LEGS   PRASUGREL (EFFIENT) 10 MG TABS TABLET    Take 1 tablet (10 mg total) by mouth daily.   PREGABALIN (LYRICA) 50 MG CAPSULE    Take one capsule by mouth twice daily for pains  Modified Medications   No medications on file  Discontinued Medications   No medications on file    Review of Systems  Constitutional: Negative for fatigue and fever.  HENT: Positive for hearing loss and voice change (Hoarseness which has persisted for several weeks. History of vocal cord polyp about 15 years ago.). Negative for congestion and ear pain.   Eyes: Negative.        Wears prescription lenses.  Respiratory: Positive for shortness of breath (with exertion). Negative for cough, choking, chest tightness and wheezing.        Voice change. Dysphonia. 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    Cardiovascular: Negative for chest pain, palpitations and leg swelling.  Gastrointestinal: Negative for abdominal distention, abdominal pain, constipation, diarrhea, nausea and vomiting.       Barrett's esophagus and reflux history  Endocrine: Negative for cold intolerance, heat intolerance, polydipsia, polyphagia and polyuria.       Diabetes under poor control.  Genitourinary: Negative.   Musculoskeletal: Positive for arthralgias, back pain, gait problem and joint swelling. Negative for myalgias, neck pain and neck stiffness.       Tender left sacroiliac joint area and at the ischium on palpation.  Skin: Negative.   Allergic/Immunologic: Negative.   Neurological: Positive for tremors (both upper and lower extremities), weakness, numbness (to bilateral feet, with  restless legs) and headaches. Negative for dizziness, seizures, syncope, facial asymmetry, speech difficulty and light-headedness.       Improvement in his restless legs  Hematological: Bruises/bleeds easily.       Iron deficiency anemia without obvious blood loss.  Psychiatric/Behavioral: The patient is nervous/anxious.     Vitals:   10/11/15 1220  BP: 140/82  Pulse: (!) 56  Temp: 97.5 F (36.4 C)  TempSrc: Oral  SpO2: 97%  Weight: 179 lb (81.2 kg)  Height: 5' 8"  (1.727 m)   Body mass index is 27.22 kg/m. Wt Readings from Last 3 Encounters:  10/11/15 179 lb (81.2 kg)  09/25/15 180 lb (81.6 kg)  09/18/15 184 lb (83.5 kg)      Physical Exam  Constitutional: He is oriented to person, place, and time. He appears well-developed and well-nourished. No distress.  overweight  HENT:  Head: Normocephalic and atraumatic.  Right Ear: External ear normal.  Left Ear: External ear normal.  Nose: Nose normal.  Mouth/Throat: Oropharynx is clear and moist.  There is a hoarse quality to his voice as he speaks.  Eyes: Conjunctivae and EOM are normal. Pupils are equal, round, and reactive to light.  Neck: Normal range of  motion. Neck supple. No JVD present. No tracheal deviation present. No thyromegaly present.  Cardiovascular: Normal rate, regular rhythm, normal heart sounds and intact distal pulses.  Exam reveals no gallop and no friction rub.   No murmur heard. Pulmonary/Chest: Effort normal. No respiratory distress. He has no wheezes. He has no rales. He exhibits no tenderness.  Abdominal: Soft. Bowel sounds are normal. He exhibits no distension and no mass.  Musculoskeletal: He exhibits no edema or tenderness.  Bilateral shoulder discomfort with some restriction of movement especially at the left shoulder. Painful to raise left arm. Painful to lift items and to rotate at left shoulder. Has  been seen by Dr. Noemi Chapel. Chronic neck pains. Tender at insertion of the left Achilles tendon. Tender anterior to the left heel on the sole of the foot.  Lymphadenopathy:    He has no cervical adenopathy.  Neurological: He is alert and oriented to person, place, and time. He has normal reflexes. No cranial nerve deficit. Coordination normal.  Bilateral restless legs. Bilateral upper and lower 6 cps tremor. Blink reflex felt to eradicate. There is no significant cogwheeling or rigidity.  Skin: Skin is warm and dry. No rash noted. No erythema. No pallor.  Psychiatric: He has a normal mood and affect. His behavior is normal. Judgment and thought content normal.    Labs reviewed: Lab Summary Latest Ref Rng & Units 10/09/2015 09/18/2015 09/13/2015  Hemoglobin 13.2 - 17.1 g/dL 9.2(L) 9.3(L) 9.1(L)  Hematocrit 38.5 - 50.0 % 30.1(L) 30.3(L) 28.7(L)  White count 3.8 - 10.8 K/uL 4.8 5.0 4.2  Platelet count 140 - 400 K/uL 133(L) 176 101(L)  Sodium 135 - 146 mmol/L 139 136 135  Potassium 3.5 - 5.3 mmol/L 4.7 4.0 3.7  Calcium 8.6 - 10.3 mg/dL 9.1 8.8(L) 8.5(L)  Phosphorus - (None) (None) (None)  Creatinine 0.70 - 1.25 mg/dL 0.97 1.09 1.15  AST 10 - 35 U/L 20 (None) (None)  Alk Phos 40 - 115 U/L 60 (None) (None)  Bilirubin 0.2 -  1.2 mg/dL 0.4 (None) (None)  Glucose 65 - 99 mg/dL 113(H) 131(H) 186(H)  Cholesterol - (None) (None) (None)  HDL cholesterol - (None) (None) (None)  Triglycerides - (None) (None) (None)  LDL Direct - (None) (None) (None)  LDL Calc - (None) (None) (None)  Total protein 6.1 - 8.1 g/dL 6.4 (None) (None)  Albumin 3.6 - 5.1 g/dL 3.8 (None) (None)  Some recent data might be hidden   Lab Results  Component Value Date   TSH 2.65 10/09/2015   TSH 4.810 (H) 09/19/2013   Lab Results  Component Value Date   BUN 13 10/09/2015   BUN 17 09/18/2015   BUN 14 09/13/2015   Lab Results  Component Value Date   HGBA1C 7.0 (H) 10/09/2015   HGBA1C 7.4 (H) 07/03/2015   HGBA1C 7.3 (H) 02/10/2015   10/09/2015  Serum ferritin 28 Serum iron 24 Iron saturation 8% Reticulocyte count 1.7 %  Assessment/Plan  1. Anemia, iron deficiency No obvious sources of blood loss - Ambulatory referral to Gastroenterology - CBC with Differential/Platelet; Future - ferrous sulfate 325 (65 FE) MG tablet; Take one tablet daily to correct anemia  Dispense: 30 tablet; Refill: 3  2. Shortness of breath Possibly related to his severe anemia  3. Controlled type 2 DM with peripheral circulatory disorder (HCC) Slightly improved   4. Essential hypertension Controlled  5. Elevated CPK Stable  6. Restless legs syndrome (RLS) Unchanged.

## 2015-10-16 DIAGNOSIS — R0602 Shortness of breath: Secondary | ICD-10-CM | POA: Diagnosis not present

## 2015-10-16 DIAGNOSIS — D500 Iron deficiency anemia secondary to blood loss (chronic): Secondary | ICD-10-CM | POA: Diagnosis not present

## 2015-10-16 DIAGNOSIS — R531 Weakness: Secondary | ICD-10-CM | POA: Diagnosis not present

## 2015-10-19 ENCOUNTER — Encounter: Payer: Self-pay | Admitting: Internal Medicine

## 2015-10-19 DIAGNOSIS — I2510 Atherosclerotic heart disease of native coronary artery without angina pectoris: Secondary | ICD-10-CM | POA: Diagnosis not present

## 2015-10-19 DIAGNOSIS — Z79899 Other long term (current) drug therapy: Secondary | ICD-10-CM | POA: Diagnosis not present

## 2015-10-19 DIAGNOSIS — Z794 Long term (current) use of insulin: Secondary | ICD-10-CM | POA: Diagnosis not present

## 2015-10-19 DIAGNOSIS — Z955 Presence of coronary angioplasty implant and graft: Secondary | ICD-10-CM | POA: Diagnosis not present

## 2015-10-19 DIAGNOSIS — K2970 Gastritis, unspecified, without bleeding: Secondary | ICD-10-CM | POA: Diagnosis not present

## 2015-10-19 DIAGNOSIS — Z7982 Long term (current) use of aspirin: Secondary | ICD-10-CM | POA: Diagnosis not present

## 2015-10-19 DIAGNOSIS — K208 Other esophagitis: Secondary | ICD-10-CM | POA: Diagnosis not present

## 2015-10-19 DIAGNOSIS — K3189 Other diseases of stomach and duodenum: Secondary | ICD-10-CM | POA: Diagnosis not present

## 2015-10-19 DIAGNOSIS — K644 Residual hemorrhoidal skin tags: Secondary | ICD-10-CM | POA: Diagnosis not present

## 2015-10-19 DIAGNOSIS — D509 Iron deficiency anemia, unspecified: Secondary | ICD-10-CM | POA: Diagnosis not present

## 2015-10-19 DIAGNOSIS — E119 Type 2 diabetes mellitus without complications: Secondary | ICD-10-CM | POA: Diagnosis not present

## 2015-10-19 DIAGNOSIS — K219 Gastro-esophageal reflux disease without esophagitis: Secondary | ICD-10-CM | POA: Diagnosis not present

## 2015-10-19 DIAGNOSIS — K2950 Unspecified chronic gastritis without bleeding: Secondary | ICD-10-CM | POA: Diagnosis not present

## 2015-10-19 LAB — HM COLONOSCOPY

## 2015-10-20 ENCOUNTER — Other Ambulatory Visit: Payer: Self-pay | Admitting: Nurse Practitioner

## 2015-10-27 ENCOUNTER — Institutional Professional Consult (permissible substitution): Payer: Medicare Other | Admitting: Internal Medicine

## 2015-10-30 ENCOUNTER — Telehealth: Payer: Self-pay

## 2015-10-30 ENCOUNTER — Other Ambulatory Visit: Payer: Medicare Other

## 2015-10-30 DIAGNOSIS — D509 Iron deficiency anemia, unspecified: Secondary | ICD-10-CM

## 2015-10-30 LAB — CBC WITH DIFFERENTIAL/PLATELET
BASOS ABS: 41 {cells}/uL (ref 0–200)
Basophils Relative: 1 %
EOS PCT: 4 %
Eosinophils Absolute: 164 cells/uL (ref 15–500)
HEMATOCRIT: 34.6 % — AB (ref 38.5–50.0)
HEMOGLOBIN: 11.2 g/dL — AB (ref 13.2–17.1)
LYMPHS ABS: 1271 {cells}/uL (ref 850–3900)
LYMPHS PCT: 31 %
MCH: 24 pg — AB (ref 27.0–33.0)
MCHC: 32.4 g/dL (ref 32.0–36.0)
MCV: 74.2 fL — ABNORMAL LOW (ref 80.0–100.0)
MONO ABS: 451 {cells}/uL (ref 200–950)
MPV: 10.5 fL (ref 7.5–12.5)
Monocytes Relative: 11 %
NEUTROS PCT: 53 %
Neutro Abs: 2173 cells/uL (ref 1500–7800)
Platelets: 149 10*3/uL (ref 140–400)
RBC: 4.66 MIL/uL (ref 4.20–5.80)
RDW: 20.9 % — AB (ref 11.0–15.0)
WBC: 4.1 10*3/uL (ref 3.8–10.8)

## 2015-10-30 NOTE — Telephone Encounter (Signed)
Patient asked that the results for the CBC with Differential that was drawn on 10/30/15 be sent to the following GI provider.   Tim Misenheimer [address] [place] [id number]  Phone; [phone]

## 2015-11-01 ENCOUNTER — Ambulatory Visit (INDEPENDENT_AMBULATORY_CARE_PROVIDER_SITE_OTHER): Payer: Medicare Other | Admitting: Internal Medicine

## 2015-11-01 ENCOUNTER — Encounter: Payer: Self-pay | Admitting: Internal Medicine

## 2015-11-01 VITALS — BP 120/82 | HR 57 | Temp 97.6°F | Ht 68.0 in | Wt 179.0 lb

## 2015-11-01 DIAGNOSIS — D509 Iron deficiency anemia, unspecified: Secondary | ICD-10-CM | POA: Diagnosis not present

## 2015-11-01 DIAGNOSIS — E1151 Type 2 diabetes mellitus with diabetic peripheral angiopathy without gangrene: Secondary | ICD-10-CM | POA: Diagnosis not present

## 2015-11-01 DIAGNOSIS — R0602 Shortness of breath: Secondary | ICD-10-CM | POA: Diagnosis not present

## 2015-11-01 DIAGNOSIS — I10 Essential (primary) hypertension: Secondary | ICD-10-CM

## 2015-11-01 DIAGNOSIS — G2581 Restless legs syndrome: Secondary | ICD-10-CM

## 2015-11-01 NOTE — Progress Notes (Signed)
Facility  [facility]    Place of Service:   OFFICE    Allergies  Allergen Reactions  . Lyrica [Pregabalin] Other (See Comments)    Made patient very lethargic the next morning, very hard to patient to function, move, etc.   . Statins Other (See Comments)    Causes Restless Legs, rhabdomyolysis    Chief Complaint  Patient presents with  . Medical Management of Chronic Issues    3 week follow-up SOB, review labs. Doing some better. Had a problem going up steps at ball game, had to stop before getting to his seat.     HPI:  Anemia, iron deficiency - patient has had both EGD and colonoscopy. The results were negative except for some hemorrhoids. No active bleeding sites were noted. Hemoglobin has improved to 11.2 g percent. MCV is still low at 74.  Essential hypertension - controlled  Restless legs syndrome (RLS) - patient has seen his neurologist and he will remain on pramipexole at current dosage. Restless legs have not changed despite improvement in his hemoglobin.  Shortness of breath - steady improvement as hemoglobin has improved. Patient previously had an appointment with a pulmonary specialist, but with improvement in his reported dyspnea on exertion, I do not feel that he needs to see the pulmonary doctor at this time.    Medications: Patient's Medications  New Prescriptions   No medications on file  Previous Medications   ACETAMINOPHEN (TYLENOL) 500 MG TABLET    Take 1,000-1,500 mg by mouth every 6 (six) hours as needed for headache.   ALIROCUMAB (PRALUENT) 75 MG/ML SOPN    Inject 1 application into the skin every 14 (fourteen) days.   ASPIRIN 81 MG EC TABLET    Take 1 tablet (81 mg total) by mouth daily.   BD PEN NEEDLE NANO U/F 32G X 4 MM MISC    USE TWICE DAILY WITH LANTUS PEN   CARVEDILOL (COREG) 6.25 MG TABLET    Take 0.5 tablets (3.125 mg total) by mouth 2 (two) times daily with a meal.   FERROUS SULFATE 325 (65 FE) MG TABLET    Take one tablet daily to correct  anemia   GLIMEPIRIDE (AMARYL) 1 MG TABLET    Take 1 mg by mouth. Take one tablet with breakfast if needed >130 for diabetes   INSULIN GLARGINE (LANTUS) 100 UNIT/ML INJECTION    Inject into the skin. Inject 20 units in morning only if BS > 150; inject 26 units in evening for diabetes   MULTIPLE VITAMIN PO    Take by mouth. Take one tablet daily   NITROGLYCERIN (NITROSTAT) 0.4 MG SL TABLET    Place 0.4 mg under the tongue every 5 (five) minutes as needed for chest pain.   PANTOPRAZOLE (PROTONIX) 40 MG TABLET    Take one tablet by mouth twice daily for stomach   PRAMIPEXOLE (MIRAPEX) 1 MG TABLET    Take 1 mg by mouth. Take 3 tablets prior to bedtime   PRASUGREL (EFFIENT) 10 MG TABS TABLET    Take 1 tablet (10 mg total) by mouth daily.   VITAMIN B-12 (CYANOCOBALAMIN) 250 MCG TABLET    Take 250 mcg by mouth daily.   VITAMIN C (ASCORBIC ACID) 500 MG TABLET    Take 500 mg by mouth daily.  Modified Medications   No medications on file  Discontinued Medications   CLONAZEPAM (KLONOPIN) 0.5 MG TABLET    One up to 4 times in 24 hours to help control  restless legs   GABAPENTIN (NEURONTIN) 100 MG CAPSULE    Take 100 mg by mouth 3 (three) times daily.   GLIMEPIRIDE (AMARYL) 1 MG TABLET    Take 1 tablet (1 mg total) by mouth daily with breakfast. To control diabetes   L-METHYLFOLATE-ALGAE-B12-B6 (FOLTANX RF) 3-90.314-2-35 MG CAPS    Take one capsule twice daily for multiple vitamin   LANTUS SOLOSTAR 100 UNIT/ML SOLOSTAR PEN    INJECT 20 UNITS SUBCUTANEOUSLY IN THE MORNING AND 24 UNITS IN THE EVENING FOR DIABETES   PRAMIPEXOLE (MIRAPEX) 1 MG TABLET    TAKE 1 TABLET BY MOUTH TWICE A DAY TO HELP WITH RESTLESS LEGS   PREGABALIN (LYRICA) 50 MG CAPSULE    Take one capsule by mouth twice daily for pains    Review of Systems  Constitutional: Negative for fatigue and fever.  HENT: Positive for hearing loss and voice change (Hoarseness which has persisted for several months. History of vocal cord polyp about 15 years  ago.). Negative for congestion and ear pain.   Eyes: Negative.        Wears prescription lenses.  Respiratory: Negative for cough, choking, chest tightness, shortness of breath and wheezing.        Voice change. Dysphonia. 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  Cardiovascular: Negative for chest pain, palpitations and leg swelling.  Gastrointestinal: Negative for abdominal distention, abdominal pain, constipation, diarrhea, nausea and vomiting.       Barrett's esophagus and reflux history. Normal EGD and colonoscopy except for some hemorrhoids in August 2017.  Endocrine: Negative for cold intolerance, heat intolerance, polydipsia, polyphagia and polyuria.       Diabetes under poor control.  Genitourinary: Negative.   Musculoskeletal: Positive for arthralgias, back pain, gait problem and joint swelling. Negative for myalgias, neck pain and neck stiffness.  Skin: Negative.   Allergic/Immunologic: Negative.   Neurological: Positive for tremors (both upper and lower extremities), weakness, numbness (to bilateral feet, with  restless legs) and headaches. Negative for dizziness, seizures, syncope, facial asymmetry, speech difficulty and light-headedness.       Improvement in his restless legs  Hematological: Bruises/bleeds easily.       Iron deficiency anemia without obvious blood loss.  Psychiatric/Behavioral: The patient is nervous/anxious.     Vitals:   11/01/15 0939  BP: 120/82  Pulse: (!) 57  Temp: 97.6 F (36.4 C)  TempSrc: Oral  SpO2: 97%  Weight: 179 lb (81.2 kg)  Height: 5' 8" (1.727 m)   Body mass index is 27.22 kg/m. Wt Readings from Last 3 Encounters:  11/01/15 179 lb (81.2 kg)  10/11/15 179 lb (81.2 kg)  09/25/15 180 lb (81.6 kg)      Physical Exam  Constitutional: He is oriented to person, place, and time. He appears well-developed and well-nourished. No distress.  overweight  HENT:  Head: Normocephalic and atraumatic.  Right Ear: External ear normal.  Left Ear:  External ear normal.  Nose: Nose normal.  Mouth/Throat: Oropharynx is clear and moist.  There is a hoarse quality to his voice as he speaks.  Eyes: Conjunctivae and EOM are normal. Pupils are equal, round, and reactive to light.  Neck: Normal range of motion. Neck supple. No JVD present. No tracheal deviation present. No thyromegaly present.  Cardiovascular: Normal rate, regular rhythm, normal heart sounds and intact distal pulses.  Exam reveals no gallop and no friction rub.   No murmur heard. Pulmonary/Chest: Effort normal. No respiratory distress. He has no wheezes. He has no rales. He  exhibits no tenderness.  Abdominal: Soft. Bowel sounds are normal. He exhibits no distension and no mass.  Musculoskeletal: He exhibits no edema or tenderness.  Bilateral shoulder discomfort with some restriction of movement especially at the left shoulder. Painful to raise left arm. Painful to lift items and to rotate at left shoulder. Has been seen by Dr. Noemi Chapel. Chronic neck pains. Tender at insertion of the left Achilles tendon. Tender anterior to the left heel on the sole of the foot.  Lymphadenopathy:    He has no cervical adenopathy.  Neurological: He is alert and oriented to person, place, and time. He has normal reflexes. No cranial nerve deficit. Coordination normal.  Bilateral restless legs. Bilateral upper and lower 6 cps tremor. Blink reflex felt to eradicate. There is no significant cogwheeling or rigidity.  Skin: Skin is warm and dry. No rash noted. No erythema. No pallor.  Psychiatric: He has a normal mood and affect. His behavior is normal. Judgment and thought content normal.    Labs reviewed: Lab Summary Latest Ref Rng & Units 10/30/2015 10/09/2015 09/18/2015 09/13/2015  Hemoglobin 13.2 - 17.1 g/dL 11.2(L) 9.2(L) 9.3(L) 9.1(L)  Hematocrit 38.5 - 50.0 % 34.6(L) 30.1(L) 30.3(L) 28.7(L)  White count 3.8 - 10.8 K/uL 4.1 4.8 5.0 4.2  Platelet count 140 - 400 K/uL 149 133(L) 176 101(L)  Sodium  135 - 146 mmol/L (None) 139 136 135  Potassium 3.5 - 5.3 mmol/L (None) 4.7 4.0 3.7  Calcium 8.6 - 10.3 mg/dL (None) 9.1 8.8(L) 8.5(L)  Phosphorus - (None) (None) (None) (None)  Creatinine 0.70 - 1.25 mg/dL (None) 0.97 1.09 1.15  AST 10 - 35 U/L (None) 20 (None) (None)  Alk Phos 40 - 115 U/L (None) 60 (None) (None)  Bilirubin 0.2 - 1.2 mg/dL (None) 0.4 (None) (None)  Glucose 65 - 99 mg/dL (None) 113(H) 131(H) 186(H)  Cholesterol - (None) (None) (None) (None)  HDL cholesterol - (None) (None) (None) (None)  Triglycerides - (None) (None) (None) (None)  LDL Direct - (None) (None) (None) (None)  LDL Calc - (None) (None) (None) (None)  Total protein 6.1 - 8.1 g/dL (None) 6.4 (None) (None)  Albumin 3.6 - 5.1 g/dL (None) 3.8 (None) (None)  Some recent data might be hidden   Lab Results  Component Value Date   TSH 2.65 10/09/2015   TSH 4.810 (H) 09/19/2013   Lab Results  Component Value Date   BUN 13 10/09/2015   BUN 17 09/18/2015   BUN 14 09/13/2015   Lab Results  Component Value Date   HGBA1C 7.0 (H) 10/09/2015   HGBA1C 7.4 (H) 07/03/2015   HGBA1C 7.3 (H) 02/10/2015    Assessment/Plan  1. Anemia, iron deficiency impmroved - CBC with Differential/Platelet; Future - Reticulocytes; Future  2. Essential hypertension controlled  3. Restless legs syndrome (RLS) unchanged  4. Shortness of breath improved  5. Controlled type 2 DM with peripheral circulatory disorder (HCC) - Hemoglobin A1c; Future - Basic metabolic panel; Future    Cc:  Kyra Leyland, MD  [facility]  [place], [place] [address]    Cherylann Banas, MD  [facility], [address] [place]  [place], [place] [address]

## 2015-11-03 ENCOUNTER — Other Ambulatory Visit: Payer: Medicare Other

## 2015-11-07 ENCOUNTER — Ambulatory Visit: Payer: Medicare Other | Admitting: Internal Medicine

## 2015-11-23 DIAGNOSIS — D649 Anemia, unspecified: Secondary | ICD-10-CM | POA: Diagnosis not present

## 2015-11-27 DIAGNOSIS — K644 Residual hemorrhoidal skin tags: Secondary | ICD-10-CM | POA: Diagnosis not present

## 2015-11-27 DIAGNOSIS — D509 Iron deficiency anemia, unspecified: Secondary | ICD-10-CM | POA: Diagnosis not present

## 2015-11-28 DIAGNOSIS — R0609 Other forms of dyspnea: Secondary | ICD-10-CM | POA: Diagnosis not present

## 2015-11-28 DIAGNOSIS — R05 Cough: Secondary | ICD-10-CM | POA: Diagnosis not present

## 2015-11-28 DIAGNOSIS — J4541 Moderate persistent asthma with (acute) exacerbation: Secondary | ICD-10-CM | POA: Diagnosis not present

## 2015-11-28 DIAGNOSIS — M47814 Spondylosis without myelopathy or radiculopathy, thoracic region: Secondary | ICD-10-CM | POA: Diagnosis not present

## 2015-11-28 DIAGNOSIS — J301 Allergic rhinitis due to pollen: Secondary | ICD-10-CM | POA: Diagnosis not present

## 2015-11-28 DIAGNOSIS — G4730 Sleep apnea, unspecified: Secondary | ICD-10-CM | POA: Diagnosis not present

## 2015-11-28 DIAGNOSIS — R0602 Shortness of breath: Secondary | ICD-10-CM | POA: Diagnosis not present

## 2015-12-11 DIAGNOSIS — K219 Gastro-esophageal reflux disease without esophagitis: Secondary | ICD-10-CM | POA: Diagnosis not present

## 2015-12-11 DIAGNOSIS — R05 Cough: Secondary | ICD-10-CM | POA: Diagnosis not present

## 2015-12-21 DIAGNOSIS — H5203 Hypermetropia, bilateral: Secondary | ICD-10-CM | POA: Diagnosis not present

## 2015-12-21 DIAGNOSIS — H524 Presbyopia: Secondary | ICD-10-CM | POA: Diagnosis not present

## 2015-12-21 DIAGNOSIS — E119 Type 2 diabetes mellitus without complications: Secondary | ICD-10-CM | POA: Diagnosis not present

## 2015-12-21 DIAGNOSIS — H52223 Regular astigmatism, bilateral: Secondary | ICD-10-CM | POA: Diagnosis not present

## 2015-12-21 DIAGNOSIS — H2513 Age-related nuclear cataract, bilateral: Secondary | ICD-10-CM | POA: Diagnosis not present

## 2015-12-21 LAB — HM DIABETES EYE EXAM

## 2016-01-03 ENCOUNTER — Encounter: Payer: Self-pay | Admitting: *Deleted

## 2016-01-08 ENCOUNTER — Other Ambulatory Visit: Payer: Medicare Other

## 2016-01-08 DIAGNOSIS — D500 Iron deficiency anemia secondary to blood loss (chronic): Secondary | ICD-10-CM | POA: Diagnosis not present

## 2016-01-08 DIAGNOSIS — D509 Iron deficiency anemia, unspecified: Secondary | ICD-10-CM | POA: Diagnosis not present

## 2016-01-08 DIAGNOSIS — E1151 Type 2 diabetes mellitus with diabetic peripheral angiopathy without gangrene: Secondary | ICD-10-CM | POA: Diagnosis not present

## 2016-01-08 LAB — CBC WITH DIFFERENTIAL/PLATELET
BASOS PCT: 0 %
Basophils Absolute: 0 cells/uL (ref 0–200)
EOS PCT: 2 %
Eosinophils Absolute: 104 cells/uL (ref 15–500)
HEMATOCRIT: 43.9 % (ref 38.5–50.0)
Hemoglobin: 15 g/dL (ref 13.2–17.1)
LYMPHS PCT: 28 %
Lymphs Abs: 1456 cells/uL (ref 850–3900)
MCH: 28.9 pg (ref 27.0–33.0)
MCHC: 34.2 g/dL (ref 32.0–36.0)
MCV: 84.6 fL (ref 80.0–100.0)
MONO ABS: 416 {cells}/uL (ref 200–950)
Monocytes Relative: 8 %
NEUTROS PCT: 62 %
Neutro Abs: 3224 cells/uL (ref 1500–7800)
Platelets: 108 10*3/uL — ABNORMAL LOW (ref 140–400)
RBC: 5.19 MIL/uL (ref 4.20–5.80)
RDW: 16.3 % — AB (ref 11.0–15.0)
WBC: 5.2 10*3/uL (ref 3.8–10.8)

## 2016-01-08 LAB — RETICULOCYTES
ABS Retic: 57090 cells/uL (ref 25000–90000)
RBC.: 5.19 MIL/uL (ref 4.20–5.80)
Retic Ct Pct: 1.1 %

## 2016-01-08 LAB — BASIC METABOLIC PANEL
BUN: 20 mg/dL (ref 7–25)
CALCIUM: 9.5 mg/dL (ref 8.6–10.3)
CO2: 25 mmol/L (ref 20–31)
Chloride: 102 mmol/L (ref 98–110)
Creat: 1.1 mg/dL (ref 0.70–1.25)
GLUCOSE: 160 mg/dL — AB (ref 65–99)
POTASSIUM: 4.7 mmol/L (ref 3.5–5.3)
SODIUM: 138 mmol/L (ref 135–146)

## 2016-01-08 LAB — VITAMIN B12: Vitamin B-12: 852 pg/mL (ref 200–1100)

## 2016-01-08 LAB — FOLATE: Folate: >24 ng/mL (ref >5.4)

## 2016-01-09 LAB — HEMOGLOBIN A1C
Hgb A1c MFr Bld: 7.4 % — ABNORMAL HIGH (ref <5.7)
Mean Plasma Glucose: 166 mg/dL

## 2016-01-10 ENCOUNTER — Encounter: Payer: Self-pay | Admitting: Internal Medicine

## 2016-01-10 ENCOUNTER — Ambulatory Visit (INDEPENDENT_AMBULATORY_CARE_PROVIDER_SITE_OTHER): Payer: Medicare Other

## 2016-01-10 ENCOUNTER — Ambulatory Visit (INDEPENDENT_AMBULATORY_CARE_PROVIDER_SITE_OTHER): Payer: Medicare Other | Admitting: Internal Medicine

## 2016-01-10 VITALS — BP 144/80 | HR 66 | Temp 97.6°F | Ht 68.0 in | Wt 178.0 lb

## 2016-01-10 DIAGNOSIS — M25519 Pain in unspecified shoulder: Secondary | ICD-10-CM

## 2016-01-10 DIAGNOSIS — G2581 Restless legs syndrome: Secondary | ICD-10-CM | POA: Diagnosis not present

## 2016-01-10 DIAGNOSIS — M25569 Pain in unspecified knee: Secondary | ICD-10-CM

## 2016-01-10 DIAGNOSIS — E785 Hyperlipidemia, unspecified: Secondary | ICD-10-CM | POA: Diagnosis not present

## 2016-01-10 DIAGNOSIS — R748 Abnormal levels of other serum enzymes: Secondary | ICD-10-CM

## 2016-01-10 DIAGNOSIS — I10 Essential (primary) hypertension: Secondary | ICD-10-CM

## 2016-01-10 DIAGNOSIS — E1151 Type 2 diabetes mellitus with diabetic peripheral angiopathy without gangrene: Secondary | ICD-10-CM | POA: Diagnosis not present

## 2016-01-10 DIAGNOSIS — Z0000 Encounter for general adult medical examination without abnormal findings: Secondary | ICD-10-CM

## 2016-01-10 DIAGNOSIS — D509 Iron deficiency anemia, unspecified: Secondary | ICD-10-CM | POA: Diagnosis not present

## 2016-01-10 NOTE — Patient Instructions (Signed)
[name] , Thank you for taking time to come for your Medicare Wellness Visit. I appreciate your ongoing commitment to your health goals. Please review the following plan we discussed and let me know if I can assist you in the Preventive Care for Adults  A healthy lifestyle and preventive care can promote health and wellness. Preventive health guidelines for adults include the following key practices.  . A routine yearly physical is a good way to check with your health care provider about your health and preventive screening. It is a chance to share any concerns and updates on your health and to receive a thorough exam.  . Visit your dentist for a routine exam and preventive care every 6 months. Brush your teeth twice a day and floss once a day. Good oral hygiene prevents tooth decay and gum disease.  . The frequency of eye exams is based on your age, health, family medical history, use  of contact lenses, and other factors. Follow your health care provider's ecommendations for frequency of eye exams.  . Eat a healthy diet. Foods like vegetables, fruits, whole grains, low-fat dairy products, and lean protein foods contain the nutrients you need without too many calories. Decrease your intake of foods high in solid fats, added sugars, and salt. Eat the right amount of calories for you. Get information about a proper diet from your health care provider, if necessary.  . Regular physical exercise is one of the most important things you can do for your health. Most adults should get at least 150 minutes of moderate-intensity exercise (any activity that increases your heart rate and causes you to sweat) each week. In addition, most adults need muscle-strengthening exercises on 2 or more days a week.  Silver Sneakers may be a benefit available to you. To determine eligibility, you may visit the website: www.silversneakers.com or contact program at [phone] Mon-Fri between 8AM-8PM.   . Maintain a  healthy weight. The body mass index (BMI) is a screening tool to identify possible weight problems. It provides an estimate of body fat based on height and weight. Your health care provider can find your BMI and can help you achieve or maintain a healthy weight.   For adults 20 years and older: ? A BMI below 18.5 is considered underweight. ? A BMI of 18.5 to 24.9 is normal. ? A BMI of 25 to 29.9 is considered overweight. ? A BMI of 30 and above is considered obese.   . Maintain normal blood lipids and cholesterol levels by exercising and minimizing your intake of saturated fat. Eat a balanced diet with plenty of fruit and vegetables. Blood tests for lipids and cholesterol should begin at age 66 and be repeated every 5 years. If your lipid or cholesterol levels are high, you are over 50, or you are at high risk for heart disease, you may need your cholesterol levels checked more frequently. Ongoing high lipid and cholesterol levels should be treated with medicines if diet and exercise are not working.  . If you smoke, find out from your health care provider how to quit. If you do not use tobacco, please do not start.  . If you choose to drink alcohol, please do not consume more than 2 drinks per day. One drink is considered to be 12 ounces (355 mL) of beer, 5 ounces (148 mL) of wine, or 1.5 ounces (44 mL) of liquor.  . If you are 3155-66 years old, ask your health care provider if you  should take aspirin to prevent strokes.  . Use sunscreen. Apply sunscreen liberally and repeatedly throughout the day. You should seek shade when your shadow is shorter than you. Protect yourself by wearing long sleeves, pants, a wide-brimmed hat, and sunglasses year round, whenever you are outdoors.  . Once a month, do a whole body skin exam, using a mirror to look at the skin on your back. Tell your health care provider of new moles, moles that have irregular borders, moles that are larger than a pencil eraser, or  moles that have changed in shape or color.   future.   These are the goals we discussed: Goals    None      This is a list of the screening recommended for you and due dates:  Health Maintenance  Topic Date Due  .  Hepatitis C: One time screening is recommended by Center for Disease Control  (CDC) for  adults born from [birth year] through 1966.   Jun 28, 1950  . Shingles Vaccine  08/19/2009  . Pneumonia vaccines (2 of 2 - PPSV23) 12/02/2014  . Complete foot exam   04/07/2015  . Flu Shot  03/04/2016*  . Hemoglobin A1C  07/07/2016  . Urine Protein Check  10/08/2016  . Eye exam for diabetics  12/20/2016  . Tetanus Vaccine  03/05/2021  . Colon Cancer Screening  10/18/2025  *Topic was postponed. The date shown is not the original due date.

## 2016-01-10 NOTE — Progress Notes (Signed)
Facility  [facility]    Place of Service:   OFFICE    Allergies  Allergen Reactions  . Lyrica [Pregabalin] Other (See Comments)    Made patient very lethargic the next morning, very hard to patient to function, move, etc.   . Statins Other (See Comments)    Causes Restless Legs, rhabdomyolysis    Chief Complaint  Patient presents with  . Medical Management of Chronic Issues    2 month medication management blood sugar, blood pressure, anemia    HPI:  Seen at [facility] 11/28/15 by Dr. Rennis Harding Mathewsfor dyspnea and cough. PFT done, then Ba swallow confirmed reflux but not aspiration. Follow up visit scheduled for 01/30/16.  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 Most home BO normal.  Iron deficiency anemia, unspecified iron deficiency anemia type - resolved  Restless legs syndrome (RLS) - unchanged, he is getting a little more sleep .  Hyperlipidemia, unspecified hyperlipidemia type - remains on Prasugrel  Arthralgia of lower leg, unspecified laterality - left hip. Has seen Dr. Lurena Nida in the past and is requesting referral.  Arthralgia of shoulder, unspecified laterality - Painful to raise the left arma nd shoulder. He hasa seen Dr. Amada Jupiter in the past and requests referral. He says he was told this is a problem related to his neck.  Elevated CPK - chronic. Continues with myalgias.    Medications: Patient's Medications  New Prescriptions   No medications on file  Previous Medications   ACETAMINOPHEN (TYLENOL) 500 MG TABLET    Take 1,000-1,500 mg by mouth every 6 (six) hours as needed for headache.   ALIROCUMAB (PRALUENT) 75 MG/ML SOPN    Inject 1 application into the skin every 14 (fourteen) days.   ASPIRIN 81 MG EC TABLET    Take 1 tablet (81 mg total) by mouth daily.   BD PEN NEEDLE  NANO U/F 32G X 4 MM MISC    USE TWICE DAILY WITH LANTUS PEN   BECLOMETHASONE (QVAR) 40 MCG/ACT INHALER    Inhale 1 puff into the lungs 2 (two) times daily.   CARVEDILOL (COREG) 6.25 MG TABLET    Take 0.5 tablets (3.125 mg total) by mouth 2 (two) times daily with a meal.   FERROUS SULFATE 325 (65 FE) MG TABLET    Take one tablet daily to correct anemia   FLUTICASONE (FLONASE) 50 MCG/ACT NASAL SPRAY    Place 1 spray into both nostrils daily.   GLIMEPIRIDE (AMARYL) 1 MG TABLET    Take 1 mg by mouth. Take one tablet with breakfast if needed >130 for diabetes   INSULIN GLARGINE (LANTUS) 100 UNIT/ML INJECTION    Inject into the skin. Inject 20 units in morning only if BS > 150; inject 26 units in evening for diabetes   MULTIPLE VITAMIN PO    Take by mouth. Take one tablet daily   NITROGLYCERIN (NITROSTAT) 0.4 MG SL TABLET    Place 0.4 mg under the tongue every 5 (five) minutes as needed for chest pain.   PANTOPRAZOLE (PROTONIX) 40 MG TABLET    Take 40 mg by mouth 2 (two) times daily.   PRAMIPEXOLE (MIRAPEX) 1 MG TABLET    Take 1 mg by mouth. Take 3 tablets prior to bedtime   PRASUGREL (EFFIENT) 10 MG TABS TABLET  Take 1 tablet (10 mg total) by mouth daily.   VITAMIN C (ASCORBIC ACID) 500 MG TABLET    Take 500 mg by mouth daily.  Modified Medications   No medications on file  Discontinued Medications   No medications on file    Review of Systems  Constitutional: Negative for fatigue and fever.  HENT: Positive for hearing loss and voice change (Hoarseness which has persisted for several months. History of vocal cord polyp about 15 years ago.). Negative for congestion and ear pain.   Eyes: Negative.        Wears prescription lenses.  Respiratory: Negative for cough, choking, chest tightness, shortness of breath and wheezing.        Voice change. Dysphonia. 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 Dyspnea has improved as Hgb returned to normal.  Cardiovascular: Negative for chest pain, palpitations and leg  swelling.  Gastrointestinal: Negative for abdominal distention, abdominal pain, constipation, diarrhea, nausea and vomiting.       Barrett's esophagus and reflux history. Normal EGD and colonoscopy except for some hemorrhoids in August 2017.  Endocrine: Negative for cold intolerance, heat intolerance, polydipsia, polyphagia and polyuria.       Diabetes under poor control.  Genitourinary: Negative.   Musculoskeletal: Positive for arthralgias (left shoulder and left hip), back pain, gait problem, joint swelling and neck pain. Negative for myalgias and neck stiffness.  Skin: Negative.   Allergic/Immunologic: Negative.   Neurological: Positive for tremors (both upper and lower extremities), weakness, numbness (to bilateral feet, with  restless legs) and headaches. Negative for dizziness, seizures, syncope, facial asymmetry, speech difficulty and light-headedness.       Improvement in his restless legs  Hematological: Bruises/bleeds easily.       Iron deficiency anemia without obvious blood loss.  Psychiatric/Behavioral: The patient is nervous/anxious.     Vitals:   01/10/16 0912  BP: (!) 144/80  Pulse: 66  Temp: 97.6 F (36.4 C)  TempSrc: Oral  SpO2: 97%  Weight: 178 lb (80.7 kg)  Height: 5' 8"  (1.727 m)   Body mass index is 27.06 kg/m. Wt Readings from Last 3 Encounters:  01/10/16 178 lb (80.7 kg)  01/10/16 178 lb (80.7 kg)  11/01/15 179 lb (81.2 kg)      Physical Exam  Constitutional: He is oriented to person, place, and time. He appears well-developed and well-nourished. No distress.  overweight  HENT:  Head: Normocephalic and atraumatic.  Right Ear: External ear normal.  Left Ear: External ear normal.  Nose: Nose normal.  Mouth/Throat: Oropharynx is clear and moist.  There is a hoarse quality to his voice as he speaks.  Eyes: Conjunctivae and EOM are normal. Pupils are equal, round, and reactive to light.  Neck: Normal range of motion. Neck supple. No JVD present. No  tracheal deviation present. No thyromegaly present.  Cardiovascular: Normal rate, regular rhythm, normal heart sounds and intact distal pulses.  Exam reveals no gallop and no friction rub.   No murmur heard. Pulmonary/Chest: Effort normal. No respiratory distress. He has no wheezes. He has no rales. He exhibits no tenderness.  Abdominal: Soft. Bowel sounds are normal. He exhibits no distension and no mass.  Musculoskeletal: He exhibits no edema or tenderness.  Bilateral shoulder discomfort with some restriction of movement especially at the left shoulder. Painful to raise left arm. Painful to lift items and to rotate at left shoulder. Has been seen by Dr. Noemi Chapel. Chronic neck pains. Tender at insertion of the left Achilles tendon. Tender anterior to  the left heel on the sole of the foot.  Lymphadenopathy:    He has no cervical adenopathy.  Neurological: He is alert and oriented to person, place, and time. He has normal reflexes. No cranial nerve deficit. Coordination normal.  Bilateral restless legs. Bilateral upper and lower 6 cps tremor. Blink reflex felt to eradicate. There is no significant cogwheeling or rigidity.  Skin: Skin is warm and dry. No rash noted. No erythema. No pallor.  Psychiatric: He has a normal mood and affect. His behavior is normal. Judgment and thought content normal.    Labs reviewed: Lab Summary Latest Ref Rng & Units 01/08/2016 10/30/2015 10/09/2015 09/18/2015  Hemoglobin 13.2 - 17.1 g/dL 15.0 11.2(L) 9.2(L) 9.3(L)  Hematocrit 38.5 - 50.0 % 43.9 34.6(L) 30.1(L) 30.3(L)  White count 3.8 - 10.8 K/uL 5.2 4.1 4.8 5.0  Platelet count 140 - 400 K/uL 108(L) 149 133(L) 176  Sodium 135 - 146 mmol/L 138 (None) 139 136  Potassium 3.5 - 5.3 mmol/L 4.7 (None) 4.7 4.0  Calcium 8.6 - 10.3 mg/dL 9.5 (None) 9.1 8.8(L)  Phosphorus - (None) (None) (None) (None)  Creatinine 0.70 - 1.25 mg/dL 1.10 (None) 0.97 1.09  AST 10 - 35 U/L (None) (None) 20 (None)  Alk Phos 40 - 115 U/L (None)  (None) 60 (None)  Bilirubin 0.2 - 1.2 mg/dL (None) (None) 0.4 (None)  Glucose 65 - 99 mg/dL 160(H) (None) 113(H) 131(H)  Cholesterol - (None) (None) (None) (None)  HDL cholesterol - (None) (None) (None) (None)  Triglycerides - (None) (None) (None) (None)  LDL Direct - (None) (None) (None) (None)  LDL Calc - (None) (None) (None) (None)  Total protein 6.1 - 8.1 g/dL (None) (None) 6.4 (None)  Albumin 3.6 - 5.1 g/dL (None) (None) 3.8 (None)  Some recent data might be hidden   Lab Results  Component Value Date   TSH 2.65 10/09/2015   TSH 4.810 (H) 09/19/2013   Lab Results  Component Value Date   BUN 20 01/08/2016   BUN 13 10/09/2015   BUN 17 09/18/2015   Lab Results  Component Value Date   HGBA1C 7.4 (H) 01/08/2016   HGBA1C 7.0 (H) 10/09/2015   HGBA1C 7.4 (H) 07/03/2015    Assessment/Plan  1. Controlled type 2 DM with peripheral circulatory disorder (HCC) Rise in A1c is related to the fact that he ran out of insulin. He is now back on his insulin. - Comprehensive metabolic panel; Future - Microalbumin, urine; Future  2. Essential hypertension No change in meds - Comprehensive metabolic panel; Future  3. Iron deficiency anemia, unspecified iron deficiency anemia type resolved  4. Restless legs syndrome (RLS) Continue current meds  5. Hyperlipidemia, unspecified hyperlipidemia type - Lipid panel; Future  6. Arthralgia of lower leg, unspecified laterality - Ambulatory referral to Orthopedic Surgery  7. Arthralgia of shoulder, unspecified laterality - Ambulatory referral to Orthopedic Surgery  8. Elevated CPK - CK; Future

## 2016-01-10 NOTE — Progress Notes (Signed)
Subjective:   Kenneth Shuman Sr. is a 65 y.o. male who presents for an Initial Medicare Annual Wellness Visit.  Review of Systems Cardiac Risk Factors include: advanced age (>63men, >70 women);hypertension;male gender;diabetes mellitus    Objective:    Today's Vitals   01/10/16 0841 01/10/16 0843  BP: (!) 144/80   Pulse: 66   Temp: 97.6 F (36.4 C)   TempSrc: Oral   SpO2: 97%   Weight: 178 lb (80.7 kg)   Height: 5\' 8"  (1.727 m)   PainSc: 8  9    Body mass index is 27.06 kg/m.  Current Medications (verified) Outpatient Encounter Prescriptions as of 01/10/2016  Medication Sig  . acetaminophen (TYLENOL) 500 MG tablet Take 1,000-1,500 mg by mouth every 6 (six) hours as needed for headache.  . Alirocumab (PRALUENT) 75 MG/ML SOPN Inject 1 application into the skin every 14 (fourteen) days.  Marland Kitchen aspirin 81 MG EC tablet Take 1 tablet (81 mg total) by mouth daily.  . BD PEN NEEDLE NANO U/F 32G X 4 MM MISC USE TWICE DAILY WITH LANTUS PEN  . beclomethasone (QVAR) 40 MCG/ACT inhaler Inhale 1 puff into the lungs 2 (two) times daily.  . carvedilol (COREG) 6.25 MG tablet Take 0.5 tablets (3.125 mg total) by mouth 2 (two) times daily with a meal.  . ferrous sulfate 325 (65 FE) MG tablet Take one tablet daily to correct anemia  . fluticasone (FLONASE) 50 MCG/ACT nasal spray Place 1 spray into both nostrils daily.  Marland Kitchen glimepiride (AMARYL) 1 MG tablet Take 1 mg by mouth. Take one tablet with breakfast if needed >130 for diabetes  . insulin glargine (LANTUS) 100 UNIT/ML injection Inject into the skin. Inject 20 units in morning only if BS > 150; inject 26 units in evening for diabetes  . MULTIPLE VITAMIN PO Take by mouth. Take one tablet daily  . nitroGLYCERIN (NITROSTAT) 0.4 MG SL tablet Place 0.4 mg under the tongue every 5 (five) minutes as needed for chest pain.  . pantoprazole (PROTONIX) 40 MG tablet Take 40 mg by mouth 2 (two) times daily.  . pramipexole (MIRAPEX) 1 MG tablet Take 1 mg by mouth.  Take 3 tablets prior to bedtime  . prasugrel (EFFIENT) 10 MG TABS tablet Take 1 tablet (10 mg total) by mouth daily.  . vitamin C (ASCORBIC ACID) 500 MG tablet Take 500 mg by mouth daily.  . [DISCONTINUED] pantoprazole (PROTONIX) 40 MG tablet Take one tablet by mouth twice daily for stomach (Patient taking differently: Take 40 mg by mouth 2 (two) times daily. Take one tablet by mouth twice daily for stomach)  . [DISCONTINUED] vitamin B-12 (CYANOCOBALAMIN) 250 MCG tablet Take 250 mcg by mouth daily.   No facility-administered encounter medications on file as of 01/10/2016.     Allergies (verified) Lyrica [pregabalin] and Statins   History: Past Medical History:  Diagnosis Date  . Barrett's esophagus    "we've been told that it's all gone; still take RX for GERD" (05/03/2014)  . CAD (coronary artery disease), native coronary artery    Coronary angiogram 05/03/2014:  Proximal LAD 3.0x12 mm Promus premier stent.  04/08/2014: Mid Cx 3.5 x 16 mm Promus DES.  03/29/2013: Mid LAD 2.75 x 38 mm Promus Premier drug-eluting stent, balloon angioplasty of D1 and distal LAD and stenting of distal RCA with 2.75 x 24 mm promos Premier drug-eluting stent 12/15/2012 widely patent.  . Cervicalgia   . Chronic bronchitis (HCC)    "sometimes 2-3 times/yr" (05/03/2014)  .  Complication of anesthesia    " I do not wake up very well "  . Family history of adverse reaction to anesthesia    "son w/PONV"  . GERD (gastroesophageal reflux disease)   . H/O hiatal hernia   . Headache    "weekly" (05/03/2014)  . Heart murmur    "grew out of it"   . Hyperlipidemia   . Hypoglycemia, unspecified   . IDDM (insulin dependent diabetes mellitus) (HCC)   . Impotence of organic origin   . Other malaise and fatigue   . PONV (postoperative nausea and vomiting)   . Restless leg   . Tension headache   . Unspecified gastritis and gastroduodenitis with hemorrhage   . Unstable angina pectoris (HCC) 04/08/2014   Coronary angiogram  05/03/2014:  Proximal LAD 3.0x12 mm Promus premier stent.  04/08/2014: Mid Cx 3.5 x 16 mm Promus DES.  03/29/2013: Mid LAD 2.75 x 38 mm Promus Premier drug-eluting stent, balloon angioplasty of D1 and distal LAD and stenting of distal RCA with 2.75 x 24 mm promos Premier drug-eluting stent 12/15/2012 widely patent.   Past Surgical History:  Procedure Laterality Date  . CARDIAC CATHETERIZATION N/A 09/25/2015   Procedure: Left Heart Cath and Coronary Angiography;  Surgeon: Yates DecampJay Ganji, MD;  [facility];  Service: Cardiovascular;  Laterality: N/A;  . CARDIAC CATHETERIZATION N/A 09/25/2015   Procedure: Intravascular Pressure Wire/FFR Study;  Surgeon: Yates DecampJay Ganji, MD;  [facility];  Service: Cardiovascular;  Laterality: N/A;  . CORONARY ANGIOPLASTY WITH STENT PLACEMENT  12/15/2012; 04/28/2014; 05/03/2014   "2; 1; 1"   . FRACTIONAL FLOW RESERVE WIRE Right 03/26/2013   Procedure: FRACTIONAL FLOW RESERVE WIRE;  Surgeon: Pamella PertJagadeesh R Ganji, MD;  [facility];  Service: Cardiovascular;  Laterality: Right;  . FRACTIONAL FLOW RESERVE WIRE N/A 05/03/2014   Procedure: FRACTIONAL FLOW RESERVE WIRE;  Surgeon: Pamella PertJagadeesh R Ganji, MD;  [facility];  Service: Cardiovascular;  Laterality: N/A;  . HERNIA REPAIR  2010   "umbilical"   . INCISION AND DRAINAGE ABSCESS Left 05/2007   "groin"   . KNEE SURGERY Right 1992;  2000   "calcium deposits removed" (12/15/2012)  . LEFT HEART CATHETERIZATION WITH CORONARY ANGIOGRAM N/A 12/15/2012   Procedure: LEFT HEART CATHETERIZATION WITH CORONARY ANGIOGRAM;  Surgeon: Pamella PertJagadeesh R Ganji, MD;  [facility];  Service: Cardiovascular;  Laterality: N/A;  . LEFT HEART CATHETERIZATION WITH CORONARY ANGIOGRAM N/A 03/26/2013   Procedure: LEFT HEART CATHETERIZATION WITH CORONARY ANGIOGRAM;  Surgeon: Pamella PertJagadeesh R Ganji, MD;  [facility];  Service: Cardiovascular;  Laterality: N/A;  . LEFT HEART CATHETERIZATION WITH CORONARY ANGIOGRAM N/A  04/08/2014   Procedure: LEFT HEART CATHETERIZATION WITH CORONARY ANGIOGRAM;  Surgeon: Micheline ChapmanMichael D Cooper, MD;  [facility];  Service: Cardiovascular;  Laterality: N/A;  . PERCUTANEOUS CORONARY STENT INTERVENTION (PCI-S) N/A 05/03/2014   Procedure: PERCUTANEOUS CORONARY STENT INTERVENTION (PCI-S);  Surgeon: Pamella PertJagadeesh R Ganji, MD;  [facility];  Service: Cardiovascular;  Laterality: N/A;  . UMBILICAL HERNIA REPAIR     Family History  Problem Relation Age of Onset  . Adopted: Yes  . Family history unknown: Yes   Social History   Occupational History  . Not on file.   Social History Main Topics  . Smoking status: Never Smoker  . Smokeless tobacco: Never Used  . Alcohol use No  . Drug use: No  . Sexual activity: No   Tobacco Counseling Counseling given: No  Activities of Daily Living In your present state of health, do you have any difficulty performing the following activities: 01/10/2016 09/25/2015  Hearing? Y N  Vision? N N  Difficulty concentrating or making decisions? Y N  Walking or climbing stairs? N N  Dressing or bathing? N N  Doing errands, shopping? N -  Preparing Food and eating ? N -  Using the Toilet? N -  In the past six months, have you accidently leaked urine? N -  Do you have problems with loss of bowel control? N -  Managing your Medications? N -  Managing your Finances? N -  Housekeeping or managing your Housekeeping? N -  Some recent data might be hidden    Immunizations and Health Maintenance Immunization History  Administered Date(s) Administered  . Pneumococcal Conjugate-13 12/01/2013  . Td 03/04/1998  . Tdap 03/06/2011   Health Maintenance Due  Topic Date Due  . Hepatitis C Screening  [birth year]  . FOOT EXAM  04/07/2015    Patient Care Team: Kimber RelicArthur G Carolyne Whitsel, MD as PCP - General (Internal Medicine) Yates DecampJay Ganji, MD as Consulting Physician (Cardiology) Van ClinesKaren M Aquino, MD as Consulting Physician (Neurology) [name] Burundiman, OD  (Optometry) Christia Readingwight Bates, MD as Consulting Physician (Otolaryngology)  Indicate any recent Medical Services you may have received from other than [facility] providers in the past year (date may be approximate).    Assessment:   This is a routine wellness examination for [name].   Hearing/Vision screen Hearing Screening Comments: Unsure of last hearing screen. States he can't hear high frequency.  Vision Screening Comments: Last eye exam done Oct. 2017.   Dietary issues and exercise activities discussed: Current Exercise Habits: The patient does not participate in regular exercise at present (Pt was going to the gym 3x per week, stopped back in June when problems w/ heart occurred. ), Exercise limited by: cardiac condition(s)  Goals    . Increase water intake          Starting 01/10/16, I will attempt to increase my water intake and try to get around 60oz of water per day.       Depression Screen PHQ 2/9 Scores 01/10/2016 11/30/2014 10/18/2014 04/06/2014  PHQ - 2 Score 0 0 0 0    Fall Risk Fall Risk  01/10/2016 07/05/2015 04/26/2015 02/14/2015 11/30/2014  Falls in the past year? No No No No No    Cognitive Function: MMSE - Mini Mental State Exam 01/10/2016  Orientation to time 5  Orientation to Place 5  Registration 3  Attention/ Calculation 5  Recall 3  Language- name 2 objects 2  Language- repeat 1  Language- follow 3 step command 3  Language- read & follow direction 1  Write a sentence 1  Copy design 1  Total score 30        Screening Tests Health Maintenance  Topic Date Due  . Hepatitis C Screening  [birth year]  . FOOT EXAM  04/07/2015  . INFLUENZA VACCINE  03/04/2016 (Originally 10/03/2015)  . ZOSTAVAX  02/28/2017 (Originally 08/19/2009)  . PNA vac Low Risk Adult (2 of 2 - PPSV23) 03/03/2017 (Originally 12/02/2014)  . HEMOGLOBIN A1C  07/07/2016  . URINE MICROALBUMIN  10/08/2016  . OPHTHALMOLOGY EXAM  12/20/2016  . TETANUS/TDAP  03/05/2021  . COLONOSCOPY  10/18/2025          Plan:    I have personally reviewed and addressed the Medicare Annual Wellness questionnaire and have noted the following in the patient's chart:  A. Medical  and social history B. Use of alcohol, tobacco or illicit drugs  C. Current medications and supplements D. Functional ability and status E.  Nutritional status F.  Physical activity G. Advance directives H. List of other physicians I.  Hospitalizations, surgeries, and ER visits in previous 12 months J.  Vitals K. Screenings to include hearing, vision, cognitive, depression L. Referrals and appointments - none  In addition, I have reviewed and discussed with patient certain preventive protocols, quality metrics, and best practice recommendations. A written personalized care plan for preventive services as well as general preventive health recommendations were provided to patient.  See attached scanned questionnaire for additional information.   Signed,   Nilda Calamity, LPN Health Advisor   I have reviewed the information entered by the Health Advisor. I was present in the office during the time of patient interaction and was available for consultation. I agree with the documentation and advice.  Lenon Curt Chilton Si, MD

## 2016-01-15 ENCOUNTER — Other Ambulatory Visit: Payer: Self-pay | Admitting: Internal Medicine

## 2016-01-23 DIAGNOSIS — M25512 Pain in left shoulder: Secondary | ICD-10-CM | POA: Diagnosis not present

## 2016-01-23 DIAGNOSIS — M545 Low back pain: Secondary | ICD-10-CM | POA: Diagnosis not present

## 2016-01-23 DIAGNOSIS — M25572 Pain in left ankle and joints of left foot: Secondary | ICD-10-CM | POA: Diagnosis not present

## 2016-01-23 DIAGNOSIS — M25552 Pain in left hip: Secondary | ICD-10-CM | POA: Diagnosis not present

## 2016-01-30 DIAGNOSIS — J301 Allergic rhinitis due to pollen: Secondary | ICD-10-CM | POA: Diagnosis not present

## 2016-01-30 DIAGNOSIS — R05 Cough: Secondary | ICD-10-CM | POA: Diagnosis not present

## 2016-01-30 DIAGNOSIS — J984 Other disorders of lung: Secondary | ICD-10-CM | POA: Diagnosis not present

## 2016-01-30 DIAGNOSIS — K219 Gastro-esophageal reflux disease without esophagitis: Secondary | ICD-10-CM | POA: Diagnosis not present

## 2016-01-30 DIAGNOSIS — G4730 Sleep apnea, unspecified: Secondary | ICD-10-CM | POA: Diagnosis not present

## 2016-02-05 ENCOUNTER — Other Ambulatory Visit: Payer: Self-pay | Admitting: Internal Medicine

## 2016-02-12 DIAGNOSIS — D649 Anemia, unspecified: Secondary | ICD-10-CM | POA: Diagnosis not present

## 2016-02-12 LAB — CBC AND DIFFERENTIAL
HCT: 42 % (ref 41–53)
HEMOGLOBIN: 14 g/dL (ref 13.5–17.5)
Platelets: 120 10*3/uL — AB (ref 150–399)
WBC: 5.2 10^3/mL

## 2016-02-15 ENCOUNTER — Encounter: Payer: Self-pay | Admitting: *Deleted

## 2016-02-15 NOTE — Progress Notes (Signed)
[facility] Ordered Dr. Milta DeitersMisenheim-He addressed. Abstracted to chart.

## 2016-03-10 ENCOUNTER — Other Ambulatory Visit: Payer: Self-pay | Admitting: Internal Medicine

## 2016-03-17 ENCOUNTER — Other Ambulatory Visit: Payer: Self-pay | Admitting: Internal Medicine

## 2016-04-02 DIAGNOSIS — M25512 Pain in left shoulder: Secondary | ICD-10-CM | POA: Diagnosis not present

## 2016-04-05 DIAGNOSIS — M25512 Pain in left shoulder: Secondary | ICD-10-CM | POA: Diagnosis not present

## 2016-04-10 DIAGNOSIS — D509 Iron deficiency anemia, unspecified: Secondary | ICD-10-CM | POA: Diagnosis not present

## 2016-04-10 DIAGNOSIS — D649 Anemia, unspecified: Secondary | ICD-10-CM | POA: Diagnosis not present

## 2016-04-10 DIAGNOSIS — E119 Type 2 diabetes mellitus without complications: Secondary | ICD-10-CM | POA: Diagnosis not present

## 2016-04-10 DIAGNOSIS — K2970 Gastritis, unspecified, without bleeding: Secondary | ICD-10-CM | POA: Diagnosis not present

## 2016-04-10 LAB — CBC AND DIFFERENTIAL
HEMATOCRIT: 40 % — AB (ref 41–53)
HEMOGLOBIN: 13.4 g/dL — AB (ref 13.5–17.5)
Platelets: 116 10*3/uL — AB (ref 150–399)
WBC: 4.1 10^3/mL

## 2016-04-10 LAB — HEPATIC FUNCTION PANEL
ALT: 40 U/L (ref 10–40)
AST: 26 U/L (ref 14–40)
Alkaline Phosphatase: 48 U/L (ref 25–125)
Bilirubin, Total: 0.5 mg/dL

## 2016-04-10 LAB — BASIC METABOLIC PANEL
BUN: 15 mg/dL (ref 4–21)
Creatinine: 1 mg/dL (ref 0.6–1.3)
GLUCOSE: 114 mg/dL
POTASSIUM: 4.5 mmol/L (ref 3.4–5.3)
SODIUM: 145 mmol/L (ref 137–147)

## 2016-04-10 LAB — HEMOGLOBIN A1C: HEMOGLOBIN A1C: 7.6

## 2016-04-11 DIAGNOSIS — R0602 Shortness of breath: Secondary | ICD-10-CM | POA: Diagnosis not present

## 2016-04-11 DIAGNOSIS — I2510 Atherosclerotic heart disease of native coronary artery without angina pectoris: Secondary | ICD-10-CM | POA: Diagnosis not present

## 2016-04-11 DIAGNOSIS — I209 Angina pectoris, unspecified: Secondary | ICD-10-CM | POA: Diagnosis not present

## 2016-04-11 DIAGNOSIS — Z9861 Coronary angioplasty status: Secondary | ICD-10-CM | POA: Diagnosis not present

## 2016-04-15 DIAGNOSIS — I700 Atherosclerosis of aorta: Secondary | ICD-10-CM | POA: Diagnosis not present

## 2016-04-15 DIAGNOSIS — I2510 Atherosclerotic heart disease of native coronary artery without angina pectoris: Secondary | ICD-10-CM | POA: Diagnosis not present

## 2016-04-15 DIAGNOSIS — D696 Thrombocytopenia, unspecified: Secondary | ICD-10-CM | POA: Diagnosis not present

## 2016-04-15 DIAGNOSIS — R935 Abnormal findings on diagnostic imaging of other abdominal regions, including retroperitoneum: Secondary | ICD-10-CM | POA: Diagnosis not present

## 2016-04-17 ENCOUNTER — Telehealth: Payer: Self-pay | Admitting: *Deleted

## 2016-04-17 NOTE — Telephone Encounter (Signed)
Received fax from Sherri with [facility] regarding Pre Operative Clearance Form for Left rotator cuff repair, shoulder scope with Dr. Mckinley Jewelaniel Murphy. Needs filled out and faxed back to Fax [phone] Attn Sherri. Placed in filing cabinet for appointment on 05/01/16 with Dr. Chilton SiGreen.

## 2016-04-23 ENCOUNTER — Telehealth: Payer: Self-pay | Admitting: *Deleted

## 2016-04-23 NOTE — Telephone Encounter (Signed)
[name] with Dr. Verl DickerGanji's office called and left message on Clinical Intake Line to fax recent labs on mutual patient. [facility] Jacinto HalimGanji.

## 2016-04-24 ENCOUNTER — Encounter: Payer: Self-pay | Admitting: *Deleted

## 2016-04-29 ENCOUNTER — Other Ambulatory Visit: Payer: Medicare Other

## 2016-04-29 DIAGNOSIS — E785 Hyperlipidemia, unspecified: Secondary | ICD-10-CM

## 2016-04-29 DIAGNOSIS — E1151 Type 2 diabetes mellitus with diabetic peripheral angiopathy without gangrene: Secondary | ICD-10-CM

## 2016-04-29 DIAGNOSIS — R748 Abnormal levels of other serum enzymes: Secondary | ICD-10-CM | POA: Diagnosis not present

## 2016-04-29 DIAGNOSIS — I10 Essential (primary) hypertension: Secondary | ICD-10-CM

## 2016-04-30 LAB — COMPREHENSIVE METABOLIC PANEL
ALK PHOS: 45 U/L (ref 40–115)
ALT: 20 U/L (ref 9–46)
AST: 19 U/L (ref 10–35)
Albumin: 4.3 g/dL (ref 3.6–5.1)
BUN: 19 mg/dL (ref 7–25)
CALCIUM: 9.3 mg/dL (ref 8.6–10.3)
CHLORIDE: 105 mmol/L (ref 98–110)
CO2: 28 mmol/L (ref 20–31)
Creat: 1.08 mg/dL (ref 0.70–1.25)
Glucose, Bld: 132 mg/dL — ABNORMAL HIGH (ref 65–99)
POTASSIUM: 4.4 mmol/L (ref 3.5–5.3)
Sodium: 140 mmol/L (ref 135–146)
Total Bilirubin: 0.5 mg/dL (ref 0.2–1.2)
Total Protein: 6.7 g/dL (ref 6.1–8.1)

## 2016-04-30 LAB — MICROALBUMIN, URINE: Microalb, Ur: <0.2 mg/dL

## 2016-04-30 LAB — LIPID PANEL
CHOL/HDL RATIO: 2.3 ratio (ref <5.0)
CHOLESTEROL: 82 mg/dL (ref <200)
HDL: 35 mg/dL — AB (ref >40)
LDL CALC: 23 mg/dL (ref <100)
TRIGLYCERIDES: 119 mg/dL (ref <150)
VLDL: 24 mg/dL (ref <30)

## 2016-04-30 LAB — CK: Total CK: 154 U/L (ref 7–232)

## 2016-05-01 ENCOUNTER — Encounter: Payer: Self-pay | Admitting: Internal Medicine

## 2016-05-01 ENCOUNTER — Ambulatory Visit (INDEPENDENT_AMBULATORY_CARE_PROVIDER_SITE_OTHER): Payer: Medicare Other | Admitting: Internal Medicine

## 2016-05-01 ENCOUNTER — Ambulatory Visit: Payer: Medicare Other | Admitting: Internal Medicine

## 2016-05-01 VITALS — BP 104/86 | HR 67 | Temp 97.7°F | Ht 68.0 in | Wt 178.0 lb

## 2016-05-01 DIAGNOSIS — E785 Hyperlipidemia, unspecified: Secondary | ICD-10-CM

## 2016-05-01 DIAGNOSIS — I25118 Atherosclerotic heart disease of native coronary artery with other forms of angina pectoris: Secondary | ICD-10-CM | POA: Diagnosis not present

## 2016-05-01 DIAGNOSIS — R0602 Shortness of breath: Secondary | ICD-10-CM

## 2016-05-01 DIAGNOSIS — D509 Iron deficiency anemia, unspecified: Secondary | ICD-10-CM

## 2016-05-01 DIAGNOSIS — I10 Essential (primary) hypertension: Secondary | ICD-10-CM | POA: Diagnosis not present

## 2016-05-01 DIAGNOSIS — I209 Angina pectoris, unspecified: Secondary | ICD-10-CM

## 2016-05-01 DIAGNOSIS — G2581 Restless legs syndrome: Secondary | ICD-10-CM | POA: Diagnosis not present

## 2016-05-01 DIAGNOSIS — E1151 Type 2 diabetes mellitus with diabetic peripheral angiopathy without gangrene: Secondary | ICD-10-CM | POA: Diagnosis not present

## 2016-05-01 DIAGNOSIS — R499 Unspecified voice and resonance disorder: Secondary | ICD-10-CM

## 2016-05-01 DIAGNOSIS — M25519 Pain in unspecified shoulder: Secondary | ICD-10-CM

## 2016-05-01 DIAGNOSIS — R748 Abnormal levels of other serum enzymes: Secondary | ICD-10-CM

## 2016-05-01 MED ORDER — FLUTICASONE PROPIONATE 50 MCG/ACT NA SUSP: 1.0000 | Freq: Every day | NASAL | 1 refills | Status: DC

## 2016-05-01 MED ORDER — INSULIN GLARGINE 300 UNIT/ML ~~LOC~~ SOPN: 40.0000 [IU] | PEN_INJECTOR | Freq: Every day | SUBCUTANEOUS | 4 refills | Status: DC

## 2016-05-01 NOTE — Progress Notes (Signed)
Facility  [place]    Place of Service:   OFFICE    Allergies  Allergen Reactions  . Lyrica [Pregabalin] Other (See Comments)    Made patient very lethargic the next morning, very hard to patient to function, move, etc.   . Statins Other (See Comments)    Causes Restless Legs, rhabdomyolysis    Chief Complaint  Patient presents with  . Medical Management of Chronic Issues    3 month medication management blood pressure, blood sugar, cholesterol, review labs  . Medical Clearance    left  rotator cuff repair, Dr. 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 Got confused, but did not pass out. Ambulance called. Glucose 36 mg%.  Iron deficiency anemia, unspecified iron deficiency anemia type - resolved  Coronary artery disease involving native coronary artery of native heart with other form of angina pectoris (HCC) - stable and asymptomatic   Elevated CPK - recent lab normal  Essential hypertension - controlled  Hyperlipidemia, unspecified hyperlipidemia type - controlled with Praluent  Restless legs syndrome (RLS) - Constant movements of the legs and arms. Saw neuro, Dr. Delice Lesch. Mirapex helps a little. No pains.  Shortness of breath - beclomethasone inhaler is helpful  Arthralgia of shoulder, unspecified laterality - Getting left shoulder surgery from Dr. Percell Miller.  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   ALIROCUMAB (PRALUENT) 75 MG/ML SOPN    Inject 1 application into the skin every 14 (fourteen) days.   ASPIRIN 81 MG EC TABLET    Take 1 tablet (81 mg total) by mouth daily.   BD PEN NEEDLE NANO U/F 32G X 4 MM MISC    USE TWICE DAILY WITH  LANTUS PEN   BECLOMETHASONE (QVAR) 40 MCG/ACT INHALER    Inhale 1 puff into the lungs 2 (two) times daily.   CARVEDILOL (COREG) 6.25 MG TABLET    TAKE 1/2 TABLET BY MOUTH TWICE DAILY WITH MEALS   GLIMEPIRIDE (AMARYL) 1 MG TABLET    TAKE 1 TABLET BY MOUTH EVERY DAY WITH BREAKFAST TO CONTROL DIABETES   INSULIN GLARGINE (LANTUS) 100 UNIT/ML INJECTION    Inject into the skin. Inject 20 units in morning only if BS > 150; inject 26 units in evening for diabetes   NITROGLYCERIN (NITROSTAT) 0.4 MG SL TABLET    Place 0.4 mg under the tongue every 5 (five) minutes as needed for chest pain.   PANTOPRAZOLE (PROTONIX) 40 MG TABLET    Take 40 mg by mouth 2 (two) times daily.   PRAMIPEXOLE (MIRAPEX) 1 MG TABLET    Take 3 tablets by mouth at bedtime for restless legs   VITAMIN C (ASCORBIC ACID) 500 MG TABLET    Take 500 mg by mouth daily.  Modified Medications   Modified Medication Previous Medication   FLUTICASONE (FLONASE) 50 MCG/ACT NASAL SPRAY fluticasone (FLONASE) 50 MCG/ACT nasal spray      Place 1 spray into both nostrils daily.    Place 1 spray into both nostrils daily.  Discontinued Medications   FERROUS SULFATE 325 (65 FE) MG TABLET    Take one tablet daily to correct anemia  MULTIPLE VITAMIN PO    Take by mouth. Take one tablet daily   PRAMIPEXOLE (MIRAPEX) 1 MG TABLET    Take 1 mg by mouth. Take 3 tablets prior to bedtime   PRASUGREL (EFFIENT) 10 MG TABS TABLET    Take 1 tablet (10 mg total) by mouth daily.    Review of Systems  Constitutional: Negative for fatigue and fever.  HENT: Positive for hearing loss and voice change (Hoarseness which has persisted for several months. History of vocal cord polyp about 15 years ago.). Negative for congestion and ear pain.   Eyes: Negative.        Wears prescription lenses.  Respiratory: Negative for cough, choking, chest tightness, shortness of breath and wheezing.        Voice change. Dysphonia. 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 Dyspnea has improved as  Hgb returned to normal.  Cardiovascular: Negative for chest pain, palpitations and leg swelling.  Gastrointestinal: Negative for abdominal distention, abdominal pain, constipation, diarrhea, nausea and vomiting.       Barrett's esophagus and reflux history. Normal EGD and colonoscopy except for some hemorrhoids in August 2017.  Endocrine: Negative for cold intolerance, heat intolerance, polydipsia, polyphagia and polyuria.       Diabetes under poor control.  Genitourinary: Negative.   Musculoskeletal: Positive for arthralgias (left shoulder and left hip), back pain, gait problem, joint swelling and neck pain. Negative for myalgias and neck stiffness.  Skin: Negative.   Allergic/Immunologic: Negative.   Neurological: Positive for tremors (both upper and lower extremities), weakness, numbness (to bilateral feet, with  restless legs) and headaches. Negative for dizziness, seizures, syncope, facial asymmetry, speech difficulty and light-headedness.       Improvement in his restless legs  Hematological: Bruises/bleeds easily.       Resolved iron deficiency anemia without obvious blood loss.  Psychiatric/Behavioral: The patient is nervous/anxious.     Vitals:   05/01/16 1105  BP: 104/86  Pulse: 67  Temp: 97.7 F (36.5 C)  TempSrc: Oral  SpO2: 97%  Weight: 178 lb (80.7 kg)  Height: 5' 8"  (1.727 m)   Body mass index is 27.06 kg/m. Wt Readings from Last 3 Encounters:  05/01/16 178 lb (80.7 kg)  01/10/16 178 lb (80.7 kg)  01/10/16 178 lb (80.7 kg)      Physical Exam  Constitutional: He is oriented to person, place, and time. He appears well-developed and well-nourished. No distress.  overweight  HENT:  Head: Normocephalic and atraumatic.  Right Ear: External ear normal.  Left Ear: External ear normal.  Nose: Nose normal.  Mouth/Throat: Oropharynx is clear and moist.  There is a hoarse quality to his voice as he speaks.  Eyes: Conjunctivae and EOM are normal. Pupils are equal,  round, and reactive to light.  Neck: Normal range of motion. Neck supple. No JVD present. No tracheal deviation present. No thyromegaly present.  Cardiovascular: Normal rate, regular rhythm, normal heart sounds and intact distal pulses.  Exam reveals no gallop and no friction rub.   No murmur heard. Pulmonary/Chest: Effort normal. No respiratory distress. He has no wheezes. He has no rales. He exhibits no tenderness.  Abdominal: Soft. Bowel sounds are normal. He exhibits no distension and no mass.  Musculoskeletal: He exhibits no edema or tenderness.  Bilateral shoulder discomfort with some restriction of movement especially at the left shoulder. Painful to raise left arm. Painful to lift items and to rotate at left shoulder. Has been seen by Dr. Noemi Chapel. Chronic neck pains. Tender at  insertion of the left Achilles tendon. Tender anterior to the left heel on the sole of the foot.  Lymphadenopathy:    He has no cervical adenopathy.  Neurological: He is alert and oriented to person, place, and time. He has normal reflexes. No cranial nerve deficit. Coordination normal.  Bilateral restless legs. Bilateral upper and lower 6 cps tremor. Blink reflex felt to eradicate. There is no significant cogwheeling or rigidity.  Skin: Skin is warm and dry. No rash noted. No erythema. No pallor.  Psychiatric: He has a normal mood and affect. His behavior is normal. Judgment and thought content normal.    Labs reviewed: Lab Summary Latest Ref Rng & Units 04/29/2016 04/10/2016 02/12/2016 01/08/2016  Hemoglobin 13.5 - 17.5 g/dL (None) 13.4(A) 14.0 15.0  Hematocrit 41 - 53 % (None) 40(A) 42 43.9  White count 10:3/mL (None) 4.1 5.2 5.2  Platelet count 150 - 399 K/L (None) 116(A) 120(A) 108(L)  Sodium 135 - 146 mmol/L 140 145 (None) 138  Potassium 3.5 - 5.3 mmol/L 4.4 4.5 (None) 4.7  Calcium 8.6 - 10.3 mg/dL 9.3 (None) (None) 9.5  Phosphorus - (None) (None) (None) (None)  Creatinine 0.70 - 1.25 mg/dL 1.08 1.0  (None) 1.10  AST 10 - 35 U/L 19 26 (None) (None)  Alk Phos 40 - 115 U/L 45 48 (None) (None)  Bilirubin 0.2 - 1.2 mg/dL 0.5 (None) (None) (None)  Glucose 65 - 99 mg/dL 132(H) 114 (None) 160(H)  Cholesterol <200 mg/dL 82 (None) (None) (None)  HDL cholesterol >40 mg/dL 35(L) (None) (None) (None)  Triglycerides <150 mg/dL 119 (None) (None) (None)  LDL Direct - (None) (None) (None) (None)  LDL Calc <100 mg/dL 23 (None) (None) (None)  Total protein 6.1 - 8.1 g/dL 6.7 (None) (None) (None)  Albumin 3.6 - 5.1 g/dL 4.3 (None) (None) (None)  Some recent data might be hidden   Lab Results  Component Value Date   TSH 2.65 10/09/2015   TSH 4.810 (H) 09/19/2013   Lab Results  Component Value Date   BUN 19 04/29/2016   BUN 15 04/10/2016   BUN 20 01/08/2016   Lab Results  Component Value Date   HGBA1C 7.6 04/10/2016   HGBA1C 7.4 (H) 01/08/2016   HGBA1C 7.0 (H) 10/09/2015    Assessment/Plan  1. Iron deficiency anemia, unspecified iron deficiency anemia type - CBC with Differential/Platelet; Future  2. Coronary artery disease involving native coronary artery of native heart with other form of angina pectoris ([place]) Asymptomatic. Stable  3. Controlled type 2 DM with peripheral circulatory disorder (HCC) Stop Lantus and Amaryl. Start Toujeo 40 units before bed.  - Basic metabolic panel; Future - Hemoglobin A1c; Future - Microalbumin, urine; Future  4. Elevated CPK normal  5. Essential hypertension The current medical regimen is effective;  continue present plan and medications. - Basic metabolic panel; Future  6. Hyperlipidemia, unspecified hyperlipidemia type The current medical regimen is effective;  continue present plan and medications. - Lipid panel; Future  7. Restless legs syndrome (RLS) The current medical regimen is somewhat effective;  continue present plan and medications.  8. Shortness of breath improved  9. Arthralgia of shoulder, unspecified  laterality Surgery planned on the left shoulder.  10. Voice disorder improved  Patient is approved for surgery on the left shoulder.

## 2016-05-01 NOTE — Patient Instructions (Signed)
Stop Lantus and Amaryl. Start Toujeo 40 units before bed.

## 2016-05-14 ENCOUNTER — Encounter: Payer: Self-pay | Admitting: Internal Medicine

## 2016-05-15 ENCOUNTER — Other Ambulatory Visit: Payer: Self-pay | Admitting: Internal Medicine

## 2016-06-02 ENCOUNTER — Other Ambulatory Visit: Payer: Self-pay | Admitting: Internal Medicine

## 2016-06-11 ENCOUNTER — Other Ambulatory Visit: Payer: Self-pay | Admitting: *Deleted

## 2016-06-17 ENCOUNTER — Other Ambulatory Visit: Payer: Self-pay | Admitting: *Deleted

## 2016-06-17 MED ORDER — CARVEDILOL 6.25 MG PO TABS: ORAL_TABLET | ORAL | 3 refills | Status: DC

## 2016-06-17 MED ORDER — PANTOPRAZOLE SODIUM 40 MG PO TBEC: DELAYED_RELEASE_TABLET | ORAL | 3 refills | Status: DC

## 2016-06-17 NOTE — Telephone Encounter (Signed)
[facility] [address] 

## 2016-06-18 ENCOUNTER — Other Ambulatory Visit: Payer: Self-pay | Admitting: *Deleted

## 2016-06-24 ENCOUNTER — Other Ambulatory Visit: Payer: Self-pay | Admitting: *Deleted

## 2016-06-24 DIAGNOSIS — E1151 Type 2 diabetes mellitus with diabetic peripheral angiopathy without gangrene: Secondary | ICD-10-CM

## 2016-06-24 MED ORDER — INSULIN GLARGINE 300 UNIT/ML ~~LOC~~ SOPN: 40.0000 [IU] | PEN_INJECTOR | Freq: Every day | SUBCUTANEOUS | 4 refills | Status: DC

## 2016-06-24 NOTE — Telephone Encounter (Signed)
Patient requested refill

## 2016-06-25 ENCOUNTER — Encounter: Payer: Self-pay | Admitting: Internal Medicine

## 2016-07-02 ENCOUNTER — Telehealth: Payer: Self-pay | Admitting: *Deleted

## 2016-07-02 ENCOUNTER — Other Ambulatory Visit: Payer: Self-pay | Admitting: Internal Medicine

## 2016-07-02 MED ORDER — GLIMEPIRIDE 1 MG PO TABS: ORAL_TABLET | ORAL | 5 refills | Status: DC

## 2016-07-02 NOTE — Telephone Encounter (Signed)
Patient notified and agreed. Rx faxed to pharmacy.  

## 2016-07-02 NOTE — Telephone Encounter (Signed)
Patient called and stated that he wanted a refill on his Glimepiride. In last OV the Glimepiride was discontinued and Toujeo started. Patient stated that he has been taking the Toujeo every night but when his blood sugar goes over 200 he will also take a Glimepiride as needed. Patient would still like to take as needed. Please Advise.

## 2016-07-02 NOTE — Telephone Encounter (Signed)
OK to refill the glimepiride with the prior directions and dose.

## 2016-08-16 NOTE — Addendum Note (Signed)
Addended by: Maurice SmallBEATTY, Neola Worrall C on: 08/16/2016 11:14 AM   Modules accepted: Orders

## 2016-08-21 ENCOUNTER — Ambulatory Visit (INDEPENDENT_AMBULATORY_CARE_PROVIDER_SITE_OTHER): Payer: Medicare Other | Admitting: Nurse Practitioner

## 2016-08-21 ENCOUNTER — Encounter: Payer: Self-pay | Admitting: Nurse Practitioner

## 2016-08-21 VITALS — BP 132/82 | HR 64 | Temp 97.9°F | Wt 183.4 lb

## 2016-08-21 DIAGNOSIS — M545 Low back pain, unspecified: Secondary | ICD-10-CM

## 2016-08-21 DIAGNOSIS — I25118 Atherosclerotic heart disease of native coronary artery with other forms of angina pectoris: Secondary | ICD-10-CM

## 2016-08-21 MED ORDER — TIZANIDINE HCL 2 MG PO CAPS: 2.0000 mg | ORAL_CAPSULE | Freq: Two times a day (BID) | ORAL | 0 refills | Status: DC | PRN

## 2016-08-21 NOTE — Progress Notes (Signed)
Careteam: Patient Care Team: Kermit Balo, DO as PCP - General (Geriatric Medicine) [name], MD as Consulting Physician (Cardiology) Van Clines, MD as Consulting Physician (Neurology) [name], [name], OD (Optometry) [name], MD as Consulting Physician (Otolaryngology)  Advanced Directive information    Allergies  Allergen Reactions  . Lyrica [Pregabalin] Other (See Comments)    Made patient very lethargic the next morning, very hard to patient to function, move, etc.   . Statins Other (See Comments)    Causes Restless Legs, rhabdomyolysis    Chief Complaint  Patient presents with  . Acute Visit    Fall last week; has right hip pain now; RLS is also bothering him; needs diabetic foot axam and Hep C screen     HPI: Patient is a 66 y.o. male seen in the office today due to fall 8 days ago. Sli[place] at a motel. Steps were slippery.  Feet went out from under him and he hit his bottom, hip and shoulder His shoulder does not bother him but right hip has been bothering him bad.  Reports he has pulled a groin muscle in the past but after he hit his hip the groin hasn't bothered him.  For pain he is taking tylenol. Which is helpful. Has taken 1500 mg 3 times but generally takes 1000 mg.  Restless leg really bothering him and therefore has taken more pramipexole.  Walking does not bother him.  Worse when he sits down.  Reports RLS has been more aggressive and making back pain worse.  Unable to tolerate pain.  Review of Systems:  Review of Systems  Constitutional: Negative for chills, fever and weight loss.  HENT: Negative for tinnitus.   Respiratory: Negative for cough, sputum production and shortness of breath.   Cardiovascular: Negative for chest pain, palpitations and leg swelling.  Musculoskeletal: Positive for back pain, falls and myalgias. Negative for joint pain.  Skin: Negative.   Neurological: Negative for dizziness and headaches.       RLS     Past Medical History:  Diagnosis Date  . Barrett's esophagus    "we've been told that it's all gone; still take RX for GERD" (05/03/2014)  . CAD (coronary artery disease), native coronary artery    Coronary angiogram 05/03/2014:  Proximal LAD 3.0x12 mm Promus premier stent.  04/08/2014: Mid Cx 3.5 x 16 mm Promus DES.  03/29/2013: Mid LAD 2.75 x 38 mm Promus Premier drug-eluting stent, balloon angioplasty of D1 and distal LAD and stenting of distal RCA with 2.75 x 24 mm promos Premier drug-eluting stent 12/15/2012 widely patent.  . Cervicalgia   . Chronic bronchitis (HCC)    "sometimes 2-3 times/yr" (05/03/2014)  . Complication of anesthesia    " I do not wake up very well "  . Family history of adverse reaction to anesthesia    "son w/PONV"  . GERD (gastroesophageal reflux disease)   . H/O hiatal hernia   . Headache    "weekly" (05/03/2014)  . Heart murmur    "grew out of it"   . Hyperlipidemia   . Hypoglycemia, unspecified   . IDDM (insulin dependent diabetes mellitus) (HCC)   . Impotence of organic origin   . Other malaise and fatigue   . PONV (postoperative nausea and vomiting)   . Restless leg   . Tension headache   . Unspecified gastritis and gastroduodenitis with hemorrhage   . Unstable angina pectoris (HCC) 04/08/2014   Coronary angiogram 05/03/2014:  Proximal LAD 3.0x12 mm Promus premier stent.  04/08/2014: Mid Cx 3.5 x 16 mm Promus DES.  03/29/2013: Mid LAD 2.75 x 38 mm Promus Premier drug-eluting stent, balloon angioplasty of D1 and distal LAD and stenting of distal RCA with 2.75 x 24 mm promos Premier drug-eluting stent 12/15/2012 widely patent.   Past Surgical History:  Procedure Laterality Date  . CARDIAC CATHETERIZATION N/A 09/25/2015   Procedure: Left Heart Cath and Coronary Angiography;  Surgeon: [name], MD;  Location: [facility] INVASIVE CV LAB;  Service: Cardiovascular;  Laterality: N/A;  . CARDIAC CATHETERIZATION N/A 09/25/2015   Procedure: Intravascular Pressure Wire/FFR Study;   Surgeon: [name], MD;  Location: [facility] INVASIVE CV LAB;  Service: Cardiovascular;  Laterality: N/A;  . CORONARY ANGIOPLASTY WITH STENT PLACEMENT  12/15/2012; 04/28/2014; 05/03/2014   "2; 1; 1"   . FRACTIONAL FLOW RESERVE WIRE Right 03/26/2013   Procedure: FRACTIONAL FLOW RESERVE WIRE;  Surgeon: Pamella Pert, MD;  Location: [facility] CATH LAB;  Service: Cardiovascular;  Laterality: Right;  . FRACTIONAL FLOW RESERVE WIRE N/A 05/03/2014   Procedure: FRACTIONAL FLOW RESERVE WIRE;  Surgeon: Pamella Pert, MD;  Location: [facility] CATH LAB;  Service: Cardiovascular;  Laterality: N/A;  . HERNIA REPAIR  2010   "umbilical"   . INCISION AND DRAINAGE ABSCESS Left 05/2007   "groin"   . KNEE SURGERY Right 1992;  2000   "calcium deposits removed" (12/15/2012)  . LEFT HEART CATHETERIZATION WITH CORONARY ANGIOGRAM N/A 12/15/2012   Procedure: LEFT HEART CATHETERIZATION WITH CORONARY ANGIOGRAM;  Surgeon: Pamella Pert, MD;  Location: [facility] CATH LAB;  Service: Cardiovascular;  Laterality: N/A;  . LEFT HEART CATHETERIZATION WITH CORONARY ANGIOGRAM N/A 03/26/2013   Procedure: LEFT HEART CATHETERIZATION WITH CORONARY ANGIOGRAM;  Surgeon: Pamella Pert, MD;  Location: [facility] CATH LAB;  Service: Cardiovascular;  Laterality: N/A;  . LEFT HEART CATHETERIZATION WITH CORONARY ANGIOGRAM N/A 04/08/2014   Procedure: LEFT HEART CATHETERIZATION WITH CORONARY ANGIOGRAM;  Surgeon: Micheline Chapman, MD;  Location: [facility] CATH LAB;  Service: Cardiovascular;  Laterality: N/A;  . PERCUTANEOUS CORONARY STENT INTERVENTION (PCI-S) N/A 05/03/2014   Procedure: PERCUTANEOUS CORONARY STENT INTERVENTION (PCI-S);  Surgeon: Pamella Pert, MD;  Location: [facility] CATH LAB;  Service: Cardiovascular;  Laterality: N/A;  . UMBILICAL HERNIA REPAIR     Social History:   reports that he has never smoked. He has never used smokeless tobacco. He reports that he does not drink alcohol or use drugs.  Family History  Problem Relation Age of Onset  . Adopted: Yes  .  Family history unknown: Yes    Medications: Patient's Medications  New Prescriptions   No medications on file  Previous Medications   ACETAMINOPHEN (TYLENOL) 500 MG TABLET    Take 1,000-1,500 mg by mouth every 6 (six) hours as needed for headache.   ALIROCUMAB (PRALUENT) 75 MG/ML SOPN    Inject 1 application into the skin every 14 (fourteen) days.   ASPIRIN 81 MG EC TABLET    Take 1 tablet (81 mg total) by mouth daily.   BD PEN NEEDLE NANO U/F 32G X 4 MM MISC    USE TWICE DAILY WITH LANTUS PEN   BECLOMETHASONE (QVAR) 40 MCG/ACT INHALER    Inhale 1 puff into the lungs 2 (two) times daily.   CARVEDILOL (COREG) 6.25 MG TABLET    Take 1/2 tablet by mouth twice daily with meals   FLUTICASONE (FLONASE) 50 MCG/ACT NASAL SPRAY    Place 1 spray into both nostrils daily.  GLIMEPIRIDE (AMARYL) 1 MG TABLET    Take one tablet by mouth once daily as needed to control blood sugar   INSULIN GLARGINE (TOUJEO SOLOSTAR) 300 UNIT/ML SOPN    Inject 40 Units into the skin at bedtime.   NITROGLYCERIN (NITROSTAT) 0.4 MG SL TABLET    Place 0.4 mg under the tongue every 5 (five) minutes as needed for chest pain.   PANTOPRAZOLE (PROTONIX) 40 MG TABLET    Take one tablet by mouth twice daily for stomach   PRAMIPEXOLE (MIRAPEX) 1 MG TABLET    Take 3 tablets by mouth at bedtime for restless legs   VITAMIN C (ASCORBIC ACID) 500 MG TABLET    Take 500 mg by mouth daily.  Modified Medications   No medications on file  Discontinued Medications   No medications on file     Physical Exam:  Vitals:   08/21/16 1258  BP: 132/82  Pulse: 64  Temp: 97.9 F (36.6 C)  TempSrc: Oral  SpO2: 93%  Weight: 183 lb 6.4 oz (83.2 kg)   Body mass index is 27.89 kg/m.  Physical Exam  Constitutional: He is oriented to person, place, and time. He appears well-developed and well-nourished. No distress.  Neck: Normal range of motion. Neck supple.  Cardiovascular: Normal rate, regular rhythm, normal heart sounds and intact distal  pulses.  Exam reveals no gallop and no friction rub.   No murmur heard. Pulmonary/Chest: Effort normal. No respiratory distress. He has no wheezes. He has no rales. He exhibits no tenderness.  Abdominal: Soft. Bowel sounds are normal. He exhibits no distension and no mass.  Musculoskeletal: He exhibits tenderness (to right back along iliac crest). He exhibits no edema.  Neurological: He is alert and oriented to person, place, and time. He has normal reflexes. He displays normal reflexes. No cranial nerve deficit. He exhibits normal muscle tone. Coordination normal.  Bilateral restless legs. Bilateral upper and lower 6 cps tremor.   Skin: Skin is warm and dry. No rash noted. No erythema. No pallor.  Psychiatric: He has a normal mood and affect. His behavior is normal. Judgment and thought content normal.    Labs reviewed: Basic Metabolic Panel:  Recent Labs  78/29/5606/09/18 0834 01/08/16 0838 04/10/16 04/29/16 0833  NA 139 138 145 140  K 4.7 4.7 4.5 4.4  CL 105 102  --  105  CO2 28 25  --  28  GLUCOSE 113* 160*  --  132*  BUN 13 20 15 19   CREATININE 0.97 1.10 1.0 1.08  CALCIUM 9.1 9.5  --  9.3  TSH 2.65  --   --   --    Liver Function Tests:  Recent Labs  10/09/15 0834 04/10/16 04/29/16 0833  AST 20 26 19   ALT 18 40 20  ALKPHOS 60 48 45  BILITOT 0.4  --  0.5  PROT 6.4  --  6.7  ALBUMIN 3.8  --  4.3   No results for input(s): LIPASE, AMYLASE in the last 8760 hours. No results for input(s): AMMONIA in the last 8760 hours. CBC:  Recent Labs  10/09/15 0834 10/30/15 0830 01/08/16 0838 02/12/16 04/10/16  WBC 4.8 4.1 5.2 5.2 4.1  NEUTROABS 2,976 2,173 3,224  --   --   HGB 9.2* 11.2* 15.0 14.0 13.4*  HCT 30.1* 34.6* 43.9 42 40*  MCV 72.0* 74.2* 84.6  --   --   PLT 133* 149 108* 120* 116*   Lipid Panel:  Recent Labs  04/29/16 [id number]  CHOL 82  HDL 35*  LDLCALC 23  TRIG 409  CHOLHDL 2.3   TSH:  Recent Labs  10/09/15 0834  TSH 2.65   A1C: Lab Results    Component Value Date   HGBA1C 7.6 04/10/2016     Assessment/Plan 1. Acute right-sided low back pain without sciatica -after fall due to slick stairs.  -reports RLS making it worse.  -Tylenol 1000 mg every 8 hours as needed for pain Aleve 1 tablet twice daily for 7 days then as needed Heat 2-3 times daily for ~20-30 mins May use muscle rub after heat - tizanidine (ZANAFLEX) 2 MG capsule; Take 1 capsule (2 mg total) by mouth 2 (two) times daily as needed for muscle spasms.  Dispense: 30 capsule; Refill: 0  2. RLS -using twice the prescribed dose of pramipexole and feeling lethargic due to medication. -educated on importance to take prescribed dose and risk of adverse drug reaction.  -pt has follow up with Dr Renato Gails on 08/29/16 and will need to discuss RLS if it does not improve with back pain  Eldrick Penick K. Biagio Borg  [facility] [phone] 8 am - 5 pm) [phone] (after hours)

## 2016-08-21 NOTE — Patient Instructions (Signed)
Take medications as directed  Tylenol 1000 mg every 8 hours as needed for pain Aleve 1 tablet twice daily for 7 days then as needed Heat 2-3 times daily for ~20-30 mins May use muscle rub after heat To use muscle relaxer twice daily as needed -zanaflex

## 2016-08-27 ENCOUNTER — Other Ambulatory Visit: Payer: Medicare Other

## 2016-08-27 DIAGNOSIS — D509 Iron deficiency anemia, unspecified: Secondary | ICD-10-CM

## 2016-08-27 DIAGNOSIS — I10 Essential (primary) hypertension: Secondary | ICD-10-CM | POA: Diagnosis not present

## 2016-08-27 DIAGNOSIS — E1151 Type 2 diabetes mellitus with diabetic peripheral angiopathy without gangrene: Secondary | ICD-10-CM

## 2016-08-27 DIAGNOSIS — E785 Hyperlipidemia, unspecified: Secondary | ICD-10-CM

## 2016-08-27 LAB — BASIC METABOLIC PANEL
BUN: 24 mg/dL (ref 7–25)
CO2: 27 mmol/L (ref 20–31)
Calcium: 9.3 mg/dL (ref 8.6–10.3)
Chloride: 102 mmol/L (ref 98–110)
Creat: 1.05 mg/dL (ref 0.70–1.25)
Glucose, Bld: 143 mg/dL — ABNORMAL HIGH (ref 65–99)
Potassium: 4.9 mmol/L (ref 3.5–5.3)
Sodium: 136 mmol/L (ref 135–146)

## 2016-08-27 LAB — CBC WITH DIFFERENTIAL/PLATELET
Basophils Absolute: 44 cells/uL (ref 0–200)
Basophils Relative: 1 %
Eosinophils Absolute: 132 cells/uL (ref 15–500)
Eosinophils Relative: 3 %
HCT: 41 % (ref 38.5–50.0)
Hemoglobin: 13.6 g/dL (ref 13.2–17.1)
Lymphocytes Relative: 29 %
Lymphs Abs: 1276 cells/uL (ref 850–3900)
MCH: 27.2 pg (ref 27.0–33.0)
MCHC: 33.2 g/dL (ref 32.0–36.0)
MCV: 82 fL (ref 80.0–100.0)
MPV: 10.4 fL (ref 7.5–12.5)
Monocytes Absolute: 396 cells/uL (ref 200–950)
Monocytes Relative: 9 %
Neutro Abs: 2552 cells/uL (ref 1500–7800)
Neutrophils Relative %: 58 %
Platelets: 136 10*3/uL — ABNORMAL LOW (ref 140–400)
RBC: 5 MIL/uL (ref 4.20–5.80)
RDW: 15.6 % — ABNORMAL HIGH (ref 11.0–15.0)
WBC: 4.4 10*3/uL (ref 3.8–10.8)

## 2016-08-27 LAB — LIPID PANEL
Cholesterol: 94 mg/dL (ref <200)
HDL: 37 mg/dL — ABNORMAL LOW (ref >40)
LDL Cholesterol: 36 mg/dL (ref <100)
Total CHOL/HDL Ratio: 2.5 Ratio (ref <5.0)
Triglycerides: 104 mg/dL (ref <150)
VLDL: 21 mg/dL (ref <30)

## 2016-08-28 LAB — HEMOGLOBIN A1C
Hgb A1c MFr Bld: 8.3 % — ABNORMAL HIGH (ref <5.7)
Mean Plasma Glucose: 192 mg/dL

## 2016-08-28 LAB — MICROALBUMIN / CREATININE URINE RATIO
Creatinine, Urine: 67 mg/dL (ref 20–370)
Microalb, Ur: <0.2 mg/dL

## 2016-08-29 ENCOUNTER — Encounter: Payer: Self-pay | Admitting: Internal Medicine

## 2016-08-29 ENCOUNTER — Ambulatory Visit (INDEPENDENT_AMBULATORY_CARE_PROVIDER_SITE_OTHER): Payer: Medicare Other | Admitting: Internal Medicine

## 2016-08-29 VITALS — BP 138/80 | HR 71 | Temp 97.6°F | Wt 182.0 lb

## 2016-08-29 DIAGNOSIS — J410 Simple chronic bronchitis: Secondary | ICD-10-CM | POA: Diagnosis not present

## 2016-08-29 DIAGNOSIS — E785 Hyperlipidemia, unspecified: Secondary | ICD-10-CM

## 2016-08-29 DIAGNOSIS — E1151 Type 2 diabetes mellitus with diabetic peripheral angiopathy without gangrene: Secondary | ICD-10-CM | POA: Diagnosis not present

## 2016-08-29 DIAGNOSIS — I209 Angina pectoris, unspecified: Secondary | ICD-10-CM | POA: Diagnosis not present

## 2016-08-29 DIAGNOSIS — I10 Essential (primary) hypertension: Secondary | ICD-10-CM

## 2016-08-29 DIAGNOSIS — I25118 Atherosclerotic heart disease of native coronary artery with other forms of angina pectoris: Secondary | ICD-10-CM | POA: Diagnosis not present

## 2016-08-29 DIAGNOSIS — G250 Essential tremor: Secondary | ICD-10-CM | POA: Diagnosis not present

## 2016-08-29 DIAGNOSIS — G2581 Restless legs syndrome: Secondary | ICD-10-CM | POA: Diagnosis not present

## 2016-08-29 NOTE — Progress Notes (Signed)
Location:  [facility] clinic Provider:  Ryenne Lynam L. Renato Gails, D.O., C.M.D.  Goals of Care:  Advanced Directives 08/29/2016  Does Patient Have a Medical Advance Directive? No  Does patient want to make changes to medical advance directive? -  Would patient like information on creating a medical advance directive? No - Patient declined  Pre-existing out of facility DNR order (yellow form or pink MOST form) -   Chief Complaint  Patient presents with  . Medical Management of Chronic Issues    follow-up, transfer from [name], restless leg    HPI: Patient is a 66 y.o. male seen today for medical management of chronic diseases.    Restless legs bothering him.  He can't sit down w/o shaking.  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  Seems it's more of a tremor.  It's worse when he's tired.  DaT scan exam in nuclear medicine at [facility].  He has seen two neurologists and told not parkinson's tremor.  Was told the pramipexole can actually worsen his tremor--only puts him to sleep.  Medication starting with T did not help him.  Says tremor started out mild when he was in his 65s.  Just an aggravation now and then.  Then in the 60s, lipitor about killed him and that seemed to trigger it all.  He was in the ED and got sedatives.  Crestor had a similar reaction.  He is on praluent now which is working very well.  Dr. [name] had found his cholesterol to be a genetic condition.  Dropped from 200s to 89.  Now LDL 36.  Tremor had gotten better for a bit, but past 6-8 mos, it's gotten considerably worse.  When he tenses up, he gets pain.  Has to go walk and move around.  Had dysphonia for a while with it.  Was on an inhaler and nasal spray and that cleared up. Also had a cough with it.    DMII:  Controlling historically with diet--in on toujeo 40 and amaryl 1mg .  Does adjust his toujeo dose--sometimes splits it up.  Takes less if cbg 150.  Had a 36 when took full dose.  Has been eating ice  cream--gained 20 lbs, not walking and hba1c up to 8.3.  He reports he's going to cut down on the ice cream--cookout shakes.      CAD:  No chest pain recently.  Only a twinge on the right side, but not lasting.    Chronic bronchitis:  Is a bit short of breath when he squats down and then stands up.      Past Medical History:  Diagnosis Date  . Barrett's esophagus    "we've been told that it's all gone; still take RX for GERD" (05/03/2014)  . CAD (coronary artery disease), native coronary artery    Coronary angiogram 05/03/2014:  Proximal LAD 3.0x12 mm Promus premier stent.  04/08/2014: Mid Cx 3.5 x 16 mm Promus DES.  03/29/2013: Mid LAD 2.75 x 38 mm Promus Premier drug-eluting stent, balloon angioplasty of D1 and distal LAD and stenting of distal RCA with 2.75 x 24 mm promos Premier drug-eluting stent 12/15/2012 widely patent.  . Cervicalgia   . Chronic bronchitis (HCC)    "sometimes 2-3 times/yr" (05/03/2014)  . Complication of anesthesia    " I do not wake up very well "  . Family history of adverse reaction to anesthesia    "son w/PONV"  . GERD (gastroesophageal reflux disease)   .  H/O hiatal hernia   . Headache    "weekly" (05/03/2014)  . Heart murmur    "grew out of it"   . Hyperlipidemia   . Hypoglycemia, unspecified   . IDDM (insulin dependent diabetes mellitus) (HCC)   . Impotence of organic origin   . Other malaise and fatigue   . PONV (postoperative nausea and vomiting)   . Restless leg   . Tension headache   . Unspecified gastritis and gastroduodenitis with hemorrhage   . Unstable angina pectoris (HCC) 04/08/2014   Coronary angiogram 05/03/2014:  Proximal LAD 3.0x12 mm Promus premier stent.  04/08/2014: Mid Cx 3.5 x 16 mm Promus DES.  03/29/2013: Mid LAD 2.75 x 38 mm Promus Premier drug-eluting stent, balloon angioplasty of D1 and distal LAD and stenting of distal RCA with 2.75 x 24 mm promos Premier drug-eluting stent 12/15/2012 widely patent.    Past Surgical History:  Procedure  Laterality Date  . CARDIAC CATHETERIZATION N/A 09/25/2015   Procedure: Left Heart Cath and Coronary Angiography;  Surgeon: Yates DecampJay Ganji, MD;  [facility];  Service: Cardiovascular;  Laterality: N/A;  . CARDIAC CATHETERIZATION N/A 09/25/2015   Procedure: Intravascular Pressure Wire/FFR Study;  Surgeon: Yates DecampJay Ganji, MD;  [facility];  Service: Cardiovascular;  Laterality: N/A;  . CORONARY ANGIOPLASTY WITH STENT PLACEMENT  12/15/2012; 04/28/2014; 05/03/2014   "2; 1; 1"   . FRACTIONAL FLOW RESERVE WIRE Right 03/26/2013   Procedure: FRACTIONAL FLOW RESERVE WIRE;  Surgeon: Pamella PertJagadeesh R Ganji, MD;  [facility];  Service: Cardiovascular;  Laterality: Right;  . FRACTIONAL FLOW RESERVE WIRE N/A 05/03/2014   Procedure: FRACTIONAL FLOW RESERVE WIRE;  Surgeon: Pamella PertJagadeesh R Ganji, MD;  [facility];  Service: Cardiovascular;  Laterality: N/A;  . HERNIA REPAIR  2010   "umbilical"   . INCISION AND DRAINAGE ABSCESS Left 05/2007   "groin"   . KNEE SURGERY Right 1992;  2000   "calcium deposits removed" (12/15/2012)  . LEFT HEART CATHETERIZATION WITH CORONARY ANGIOGRAM N/A 12/15/2012   Procedure: LEFT HEART CATHETERIZATION WITH CORONARY ANGIOGRAM;  Surgeon: Pamella PertJagadeesh R Ganji, MD;  [facility];  Service: Cardiovascular;  Laterality: N/A;  . LEFT HEART CATHETERIZATION WITH CORONARY ANGIOGRAM N/A 03/26/2013   Procedure: LEFT HEART CATHETERIZATION WITH CORONARY ANGIOGRAM;  Surgeon: Pamella PertJagadeesh R Ganji, MD;  [facility];  Service: Cardiovascular;  Laterality: N/A;  . LEFT HEART CATHETERIZATION WITH CORONARY ANGIOGRAM N/A 04/08/2014   Procedure: LEFT HEART CATHETERIZATION WITH CORONARY ANGIOGRAM;  Surgeon: Micheline ChapmanMichael D Cooper, MD;  [facility];  Service: Cardiovascular;  Laterality: N/A;  . PERCUTANEOUS CORONARY STENT INTERVENTION (PCI-S) N/A 05/03/2014   Procedure: PERCUTANEOUS CORONARY STENT INTERVENTION (PCI-S);  Surgeon: Pamella PertJagadeesh R Ganji, MD;  [facility];  Service: Cardiovascular;  Laterality: N/A;  . UMBILICAL HERNIA REPAIR      Allergies  Allergen Reactions  . Lyrica [Pregabalin] Other (See Comments)    Made patient very lethargic the next morning, very hard to patient to function, move, etc.   . Statins Other (See Comments)    Causes Restless Legs, rhabdomyolysis    Allergies as of 08/29/2016      Reactions   Lyrica [pregabalin] Other (See Comments)   Made patient very lethargic the next morning, very hard to patient to function, move, etc.    Statins Other (See Comments)   Causes Restless Legs, rhabdomyolysis      Medication List       Accurate as  of 08/29/16  8:57 AM. Always use your most recent med list.          acetaminophen 500 MG tablet Commonly known as:  TYLENOL Take 1,000-1,500 mg by mouth every 6 (six) hours as needed for headache.   aspirin 81 MG EC tablet Take 1 tablet (81 mg total) by mouth daily.   BD PEN NEEDLE NANO U/F 32G X 4 MM Misc Generic drug:  Insulin Pen Needle USE TWICE DAILY WITH LANTUS PEN   beclomethasone 40 MCG/ACT inhaler Commonly known as:  QVAR Inhale 1 puff into the lungs 2 (two) times daily.   carvedilol 6.25 MG tablet Commonly known as:  COREG Take 1/2 tablet by mouth twice daily with meals   glimepiride 1 MG tablet Commonly known as:  AMARYL Take one tablet by mouth once daily as needed to control blood sugar   Insulin Glargine 300 UNIT/ML Sopn Commonly known as:  TOUJEO SOLOSTAR Inject 40 Units into the skin at bedtime.   nitroGLYCERIN 0.4 MG SL tablet Commonly known as:  NITROSTAT Place 0.4 mg under the tongue every 5 (five) minutes as needed for chest pain.   pantoprazole 40 MG tablet Commonly known as:  PROTONIX Take one tablet by mouth twice daily for stomach   PRALUENT 75 MG/ML Sopn Generic drug:  Alirocumab Inject 1 application into the skin every 14 (fourteen) days.   pramipexole 1 MG tablet Commonly known as:  MIRAPEX Take 3 tablets by mouth at  bedtime for restless legs   tizanidine 2 MG capsule Commonly known as:  ZANAFLEX Take 1 capsule (2 mg total) by mouth 2 (two) times daily as needed for muscle spasms.   vitamin C 500 MG tablet Commonly known as:  ASCORBIC ACID Take 500 mg by mouth daily.       Review of Systems:  Review of Systems  Constitutional: Negative for chills, fever and malaise/fatigue.  HENT: Negative for congestion and hearing loss.   Eyes: Negative for blurred vision.  Respiratory: Positive for shortness of breath. Negative for cough, sputum production and wheezing.   Cardiovascular: Negative for chest pain and palpitations.  Gastrointestinal: Negative for abdominal pain, blood in stool, constipation and melena.  Genitourinary: Negative for dysuria.  Musculoskeletal: Positive for myalgias. Negative for falls.  Skin: Negative for itching and rash.  Neurological: Positive for tremors. Negative for dizziness, loss of consciousness and weakness.  Psychiatric/Behavioral: Negative for depression and memory loss. The patient does not have insomnia.     Health Maintenance  Topic Date Due  . Hepatitis C Screening  18-Aug-1950  . FOOT EXAM  04/07/2015  . PNA vac Low Risk Adult (2 of 2 - PPSV23) 03/03/2017 (Originally 12/02/2014)  . INFLUENZA VACCINE  10/02/2016  . OPHTHALMOLOGY EXAM  12/20/2016  . HEMOGLOBIN A1C  02/26/2017  . URINE MICROALBUMIN  08/27/2017  . TETANUS/TDAP  03/05/2021  . COLONOSCOPY  10/18/2025    Physical Exam: Vitals:   08/29/16 0852  BP: 138/80  Pulse: 71  Temp: 97.6 F (36.4 C)  TempSrc: Oral  SpO2: 98%  Weight: 182 lb (82.6 kg)   Body mass index is 27.67 kg/m. Physical Exam  Constitutional: He is oriented to person, place, and time. He appears well-developed and well-nourished. No distress.  Cardiovascular: Normal rate, regular rhythm, normal heart sounds and intact distal pulses.   Pulmonary/Chest: Effort normal and breath sounds normal. No respiratory distress.    Abdominal: Bowel sounds are normal.  Musculoskeletal: Normal range of motion.  Neurological: He is alert  and oriented to person, place, and time. He displays abnormal reflex. No cranial nerve deficit or sensory deficit. He exhibits normal muscle tone. Coordination normal.  Tremor resting worst in right leg--horizontal movement, also tremor moved to upper body when seated on exam table after DTRs checked; left hyporeflexic  Skin: Skin is warm and dry. Capillary refill takes less than 2 seconds.  Psychiatric: He has a normal mood and affect.    Labs reviewed: Basic Metabolic Panel:  Recent Labs  16/10/96 0834 01/08/16 0838 04/10/16 04/29/16 0833 08/27/16 0849  NA 139 138 145 140 136  K 4.7 4.7 4.5 4.4 4.9  CL 105 102  --  105 102  CO2 28 25  --  28 27  GLUCOSE 113* 160*  --  132* 143*  BUN 13 20 15 19 24   CREATININE 0.97 1.10 1.0 1.08 1.05  CALCIUM 9.1 9.5  --  9.3 9.3  TSH 2.65  --   --   --   --    Liver Function Tests:  Recent Labs  10/09/15 0834 04/10/16 04/29/16 0833  AST 20 26 19   ALT 18 40 20  ALKPHOS 60 48 45  BILITOT 0.4  --  0.5  PROT 6.4  --  6.7  ALBUMIN 3.8  --  4.3   No results for input(s): LIPASE, AMYLASE in the last 8760 hours. No results for input(s): AMMONIA in the last 8760 hours. CBC:  Recent Labs  10/30/15 0830 01/08/16 0838 02/12/16 04/10/16 08/27/16 0849  WBC 4.1 5.2 5.2 4.1 4.4  NEUTROABS 2,173 3,224  --   --  2,552  HGB 11.2* 15.0 14.0 13.4* 13.6  HCT 34.6* 43.9 42 40* 41.0  MCV 74.2* 84.6  --   --  82.0  PLT 149 108* 120* 116* 136*   Lipid Panel:  Recent Labs  04/29/16 0833 08/27/16 0849  CHOL 82 94  HDL 35* 37*  LDLCALC 23 36  TRIG 119 104  CHOLHDL 2.3 2.5   Lab Results  Component Value Date   HGBA1C 8.3 (H) 08/27/2016    Assessment/Plan 1. Essential tremor - Ambulatory referral to Neurology--pt requests DaT screening exam to evaluate his tremor further as it has been progressing (see hpi) -I am concerned this  could be a more significant neurologic disease than just essential tremor considering progression over time and RLS type symptoms, but I am not a neurologist and I'm not aware of DaT being offered at [facility]  2. Coronary artery disease involving native coronary artery of native heart with other form of angina pectoris (HCC) -no recent chest pains -cont secondary prevention and following with Dr. [name] -h/o cath and stenting  3. 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 Essential hypertension -bp satisfactory, monitor and keep same meds  5. Restless legs syndrome (RLS) - Ambulatory referral to Neurology -having worsening symptoms interfering with his work and functioning  6. Hyperlipidemia, unspecified hyperlipidemia type -cont praluent therapy which has been very effective at lowering his LDL  7. Simple chronic bronchitis (HCC) -advised to restart his Qvar which helped his dystonia, hoarseness b/c of sob when stooping down and standing up and also to prevent exacerbations  Labs/tests ordered:   Orders Placed This Encounter  Procedures  . CBC with Differential/Platelet    Standing Status:   Future    Standing Expiration Date:  02/28/2017  . COMPLETE METABOLIC PANEL WITH GFR    Standing Status:   Future    Standing Expiration Date:   02/28/2017  . Hemoglobin A1c    Standing Status:   Future    Standing Expiration Date:   02/28/2017  . Iron, TIBC and Ferritin Panel    Standing Status:   Future    Standing Expiration Date:   02/28/2017  . Ambulatory referral to Neurology    Referral Priority:   Routine    Referral Type:   Consultation    Referral Reason:   Specialty Services Required    Requested Specialty:   Neurology    Number of Visits Requested:   1    Next appt:  3-4 mos, labs before   Ltanya Bayley L. Saran Laviolette,  D.O. Geriatrics [facility] [address], [place] [address] Cell Phone (Mon-Fri 8am-5pm):  [phone] On Call:  [phone] & follow prompts after 5pm & weekends Office Phone:  [phone] Office Fax:  [phone]

## 2016-09-14 ENCOUNTER — Other Ambulatory Visit: Payer: Self-pay | Admitting: Internal Medicine

## 2016-10-02 DIAGNOSIS — G2581 Restless legs syndrome: Secondary | ICD-10-CM | POA: Diagnosis not present

## 2016-10-30 IMAGING — CR DG CHEST 2V
2 series · 2 of 2 positions shown · non-contrast
Comparison: 09/13/2015

CLINICAL DATA: Stabbing right chest pain.

EXAM:
CHEST  2 VIEW

[chest pa]
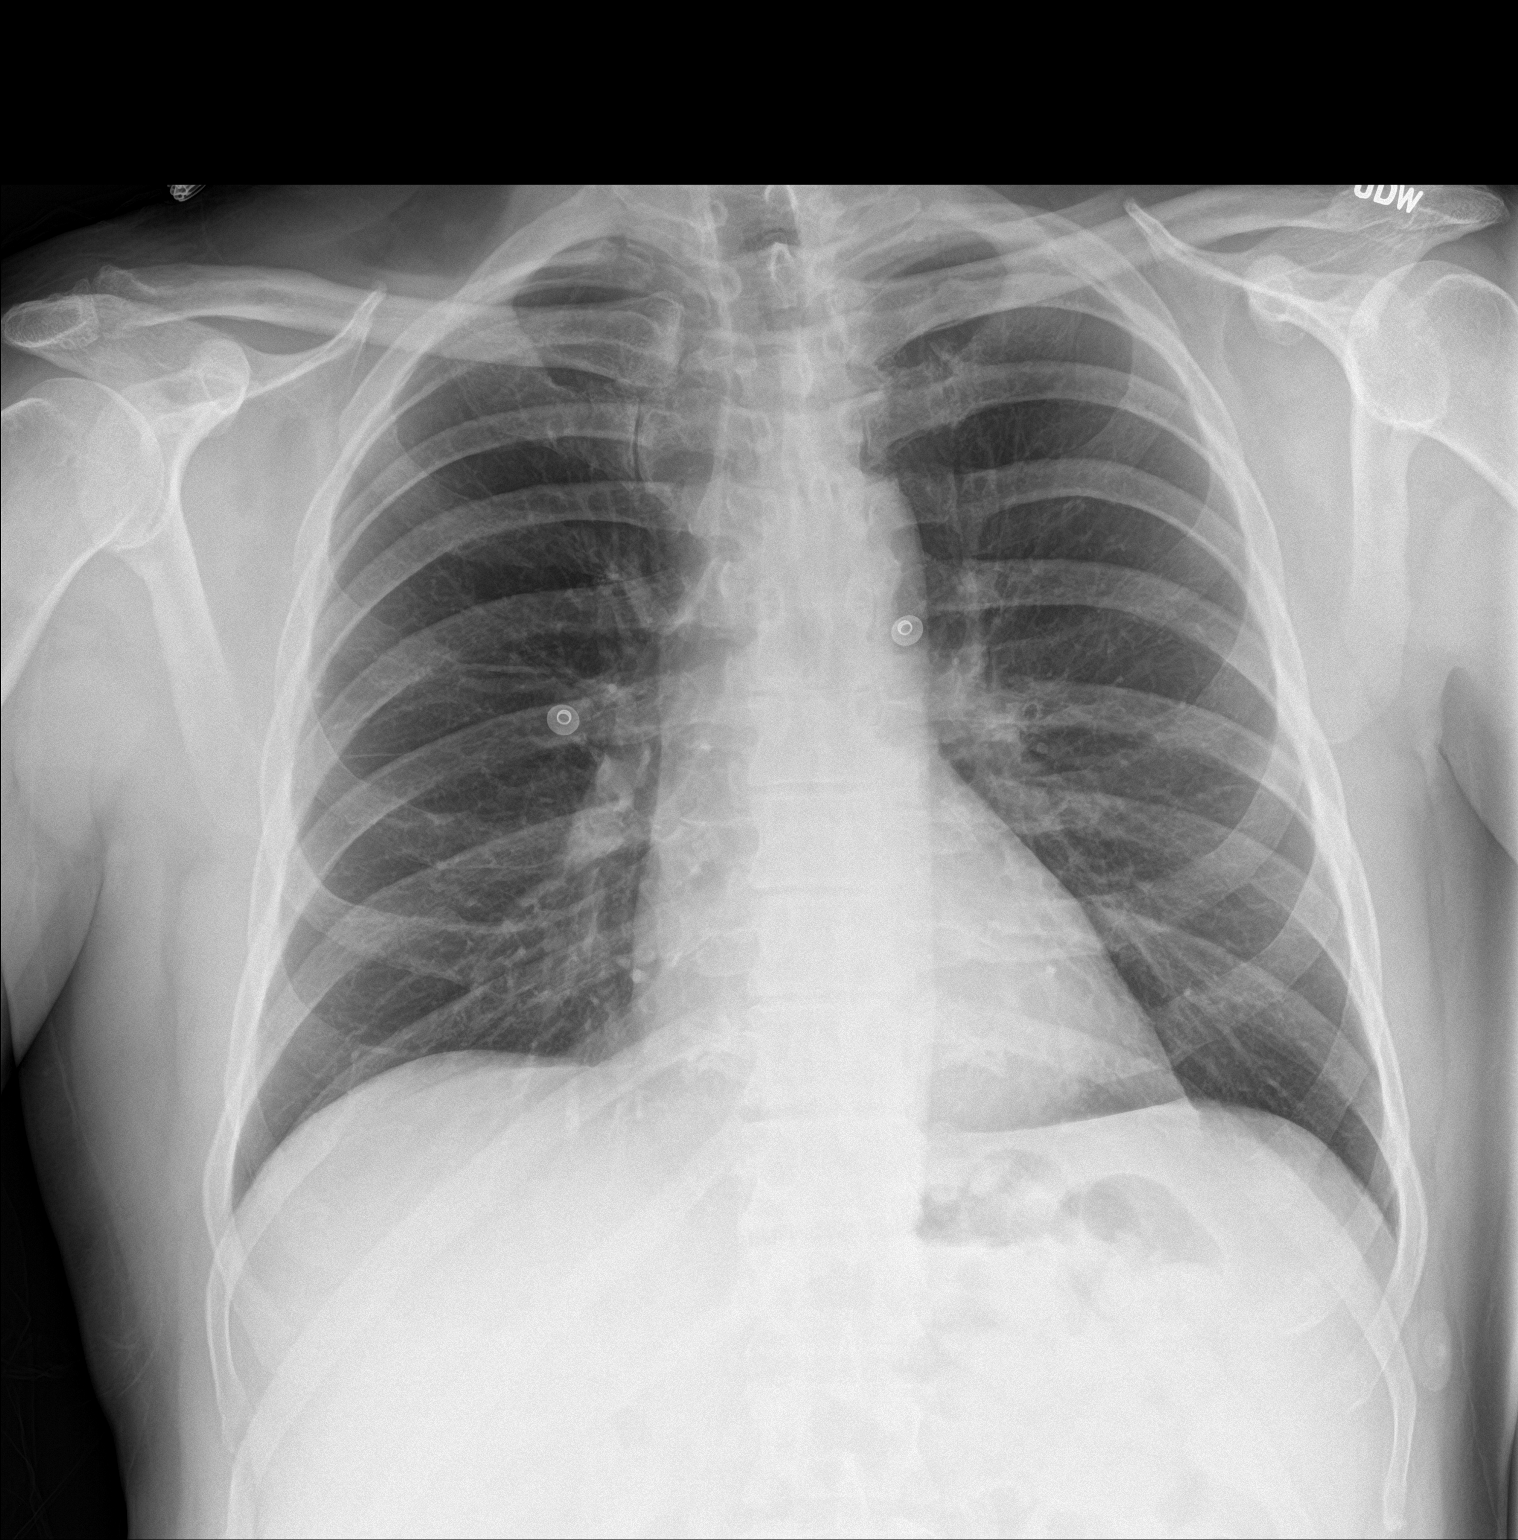

[chest lat]
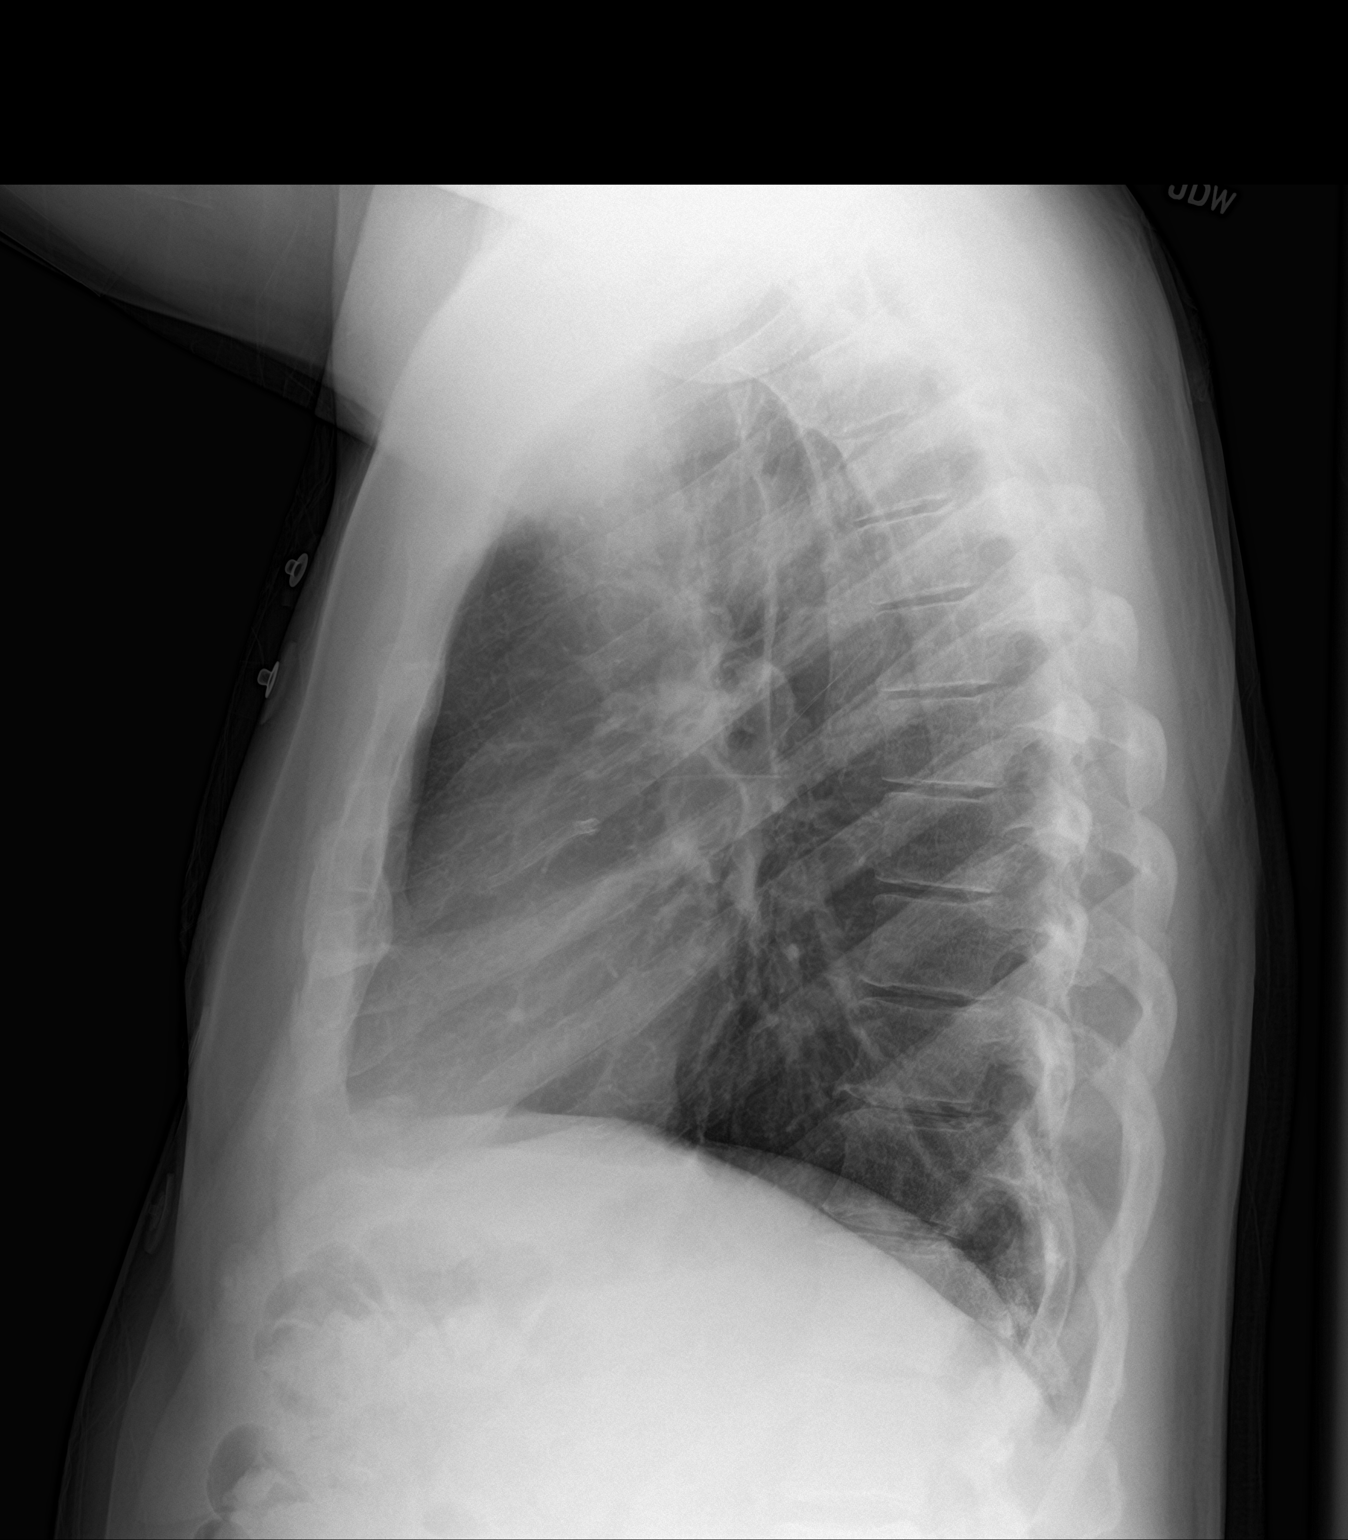

[2 of 2 positions shown; findings below may reference images not displayed]

FINDINGS: Normal heart size and mediastinal contours. Coronary stent noted. No
acute infiltrate or edema. No effusion or pneumothorax. No acute
osseous findings.
IMPRESSION: No active cardiopulmonary disease.

## 2016-11-11 ENCOUNTER — Other Ambulatory Visit: Payer: Self-pay | Admitting: Internal Medicine

## 2016-11-24 ENCOUNTER — Other Ambulatory Visit: Payer: Self-pay | Admitting: Nurse Practitioner

## 2016-12-23 DIAGNOSIS — E119 Type 2 diabetes mellitus without complications: Secondary | ICD-10-CM | POA: Diagnosis not present

## 2016-12-26 ENCOUNTER — Other Ambulatory Visit: Payer: Medicare Other

## 2016-12-26 DIAGNOSIS — I25118 Atherosclerotic heart disease of native coronary artery with other forms of angina pectoris: Secondary | ICD-10-CM

## 2016-12-26 DIAGNOSIS — E1151 Type 2 diabetes mellitus with diabetic peripheral angiopathy without gangrene: Secondary | ICD-10-CM | POA: Diagnosis not present

## 2016-12-26 DIAGNOSIS — J410 Simple chronic bronchitis: Secondary | ICD-10-CM

## 2016-12-26 DIAGNOSIS — G250 Essential tremor: Secondary | ICD-10-CM | POA: Diagnosis not present

## 2016-12-26 DIAGNOSIS — G2581 Restless legs syndrome: Secondary | ICD-10-CM

## 2016-12-27 LAB — CBC WITH DIFFERENTIAL/PLATELET
Basophils Absolute: 21 cells/uL (ref 0–200)
Basophils Relative: 0.5 %
Eosinophils Absolute: 90 cells/uL (ref 15–500)
Eosinophils Relative: 2.2 %
HCT: 36.2 % — ABNORMAL LOW (ref 38.5–50.0)
Hemoglobin: 11.9 g/dL — ABNORMAL LOW (ref 13.2–17.1)
Lymphs Abs: 1136 cells/uL (ref 850–3900)
MCH: 25.9 pg — ABNORMAL LOW (ref 27.0–33.0)
MCHC: 32.9 g/dL (ref 32.0–36.0)
MCV: 78.7 fL — ABNORMAL LOW (ref 80.0–100.0)
MPV: 11 fL (ref 7.5–12.5)
Monocytes Relative: 9 %
Neutro Abs: 2485 cells/uL (ref 1500–7800)
Neutrophils Relative %: 60.6 %
Platelets: 134 10*3/uL — ABNORMAL LOW (ref 140–400)
RBC: 4.6 10*6/uL (ref 4.20–5.80)
RDW: 13.1 % (ref 11.0–15.0)
Total Lymphocyte: 27.7 %
WBC mixed population: 369 cells/uL (ref 200–950)
WBC: 4.1 10*3/uL (ref 3.8–10.8)

## 2016-12-27 LAB — IRON,TIBC AND FERRITIN PANEL
%SAT: 10 % (calc) — ABNORMAL LOW (ref 15–60)
Ferritin: 11 ng/mL — ABNORMAL LOW (ref 20–380)
Iron: 37 ug/dL — ABNORMAL LOW (ref 50–180)
TIBC: 376 mcg/dL (calc) (ref 250–425)

## 2016-12-27 LAB — COMPLETE METABOLIC PANEL WITH GFR
AG Ratio: 1.7 (calc) (ref 1.0–2.5)
ALT: 25 U/L (ref 9–46)
AST: 24 U/L (ref 10–35)
Albumin: 4.3 g/dL (ref 3.6–5.1)
Alkaline phosphatase (APISO): 47 U/L (ref 40–115)
BUN: 24 mg/dL (ref 7–25)
CO2: 27 mmol/L (ref 20–32)
Calcium: 9.3 mg/dL (ref 8.6–10.3)
Chloride: 104 mmol/L (ref 98–110)
Creat: 1.08 mg/dL (ref 0.70–1.25)
GFR, Est African American: 82 mL/min/{1.73_m2} (ref >=60)
GFR, Est Non African American: 71 mL/min/{1.73_m2} (ref >=60)
Globulin: 2.5 g/dL (calc) (ref 1.9–3.7)
Glucose, Bld: 129 mg/dL — ABNORMAL HIGH (ref 65–99)
Potassium: 4.3 mmol/L (ref 3.5–5.3)
Sodium: 138 mmol/L (ref 135–146)
Total Bilirubin: 0.4 mg/dL (ref 0.2–1.2)
Total Protein: 6.8 g/dL (ref 6.1–8.1)

## 2016-12-27 LAB — HEMOGLOBIN A1C
Hgb A1c MFr Bld: 8.3 % of total Hgb — ABNORMAL HIGH (ref <5.7)
Mean Plasma Glucose: 192 (calc)
eAG (mmol/L): 10.6 (calc)

## 2016-12-30 ENCOUNTER — Ambulatory Visit (INDEPENDENT_AMBULATORY_CARE_PROVIDER_SITE_OTHER): Payer: Medicare Other | Admitting: Internal Medicine

## 2016-12-30 ENCOUNTER — Other Ambulatory Visit: Payer: Self-pay | Admitting: Internal Medicine

## 2016-12-30 ENCOUNTER — Encounter: Payer: Self-pay | Admitting: Internal Medicine

## 2016-12-30 VITALS — BP 120/70 | HR 64 | Temp 98.1°F | Wt 184.0 lb

## 2016-12-30 DIAGNOSIS — J984 Other disorders of lung: Secondary | ICD-10-CM

## 2016-12-30 DIAGNOSIS — G2581 Restless legs syndrome: Secondary | ICD-10-CM

## 2016-12-30 DIAGNOSIS — R251 Tremor, unspecified: Secondary | ICD-10-CM

## 2016-12-30 DIAGNOSIS — Z794 Long term (current) use of insulin: Secondary | ICD-10-CM

## 2016-12-30 DIAGNOSIS — I25118 Atherosclerotic heart disease of native coronary artery with other forms of angina pectoris: Secondary | ICD-10-CM | POA: Diagnosis not present

## 2016-12-30 DIAGNOSIS — I10 Essential (primary) hypertension: Secondary | ICD-10-CM

## 2016-12-30 DIAGNOSIS — E1159 Type 2 diabetes mellitus with other circulatory complications: Secondary | ICD-10-CM

## 2016-12-30 DIAGNOSIS — E1165 Type 2 diabetes mellitus with hyperglycemia: Secondary | ICD-10-CM | POA: Diagnosis not present

## 2016-12-30 DIAGNOSIS — D509 Iron deficiency anemia, unspecified: Secondary | ICD-10-CM

## 2016-12-30 DIAGNOSIS — R0602 Shortness of breath: Secondary | ICD-10-CM

## 2016-12-30 DIAGNOSIS — IMO0002 Reserved for concepts with insufficient information to code with codable children: Secondary | ICD-10-CM

## 2016-12-30 MED ORDER — POLYSACCHARIDE IRON COMPLEX 150 MG PO CAPS
150.0000 mg | ORAL_CAPSULE | Freq: Every day | ORAL | 3 refills | Status: DC
Start: 2016-12-30 — End: 2017-05-22

## 2016-12-30 MED ORDER — BECLOMETHASONE DIPROPIONATE 40 MCG/ACT IN AERS: 1.0000 | INHALATION_SPRAY | Freq: Two times a day (BID) | RESPIRATORY_TRACT | 3 refills | Status: DC

## 2016-12-30 MED ORDER — BASAGLAR KWIKPEN 100 UNIT/ML ~~LOC~~ SOPN: 40.0000 [IU] | PEN_INJECTOR | Freq: Every day | SUBCUTANEOUS | 3 refills | Status: DC

## 2016-12-30 NOTE — Progress Notes (Signed)
Location:   [facility]   Place of Service:   Clinic  Provider: Phillp Dolores L. Renato Gailseed, D.O., C.M.D.  Code Status: Full Goals of Care:  Advanced Directives 08/29/2016  Does Patient Have a Medical Advance Directive? No  Does patient want to make changes to medical advance directive? -  Would patient like information on creating a medical advance directive? No - Patient declined  Pre-existing out of facility DNR order (yellow form or pink MOST form) -     Chief Complaint  Patient presents with  . Medical Management of Chronic Issues    4mth follow-up    HPI: Patient is a 66 y.o. male seen today for medical management of chronic diseases.   His blood sugar has been high lately. Has been as high at 400 and as low 36 but is usually 150-280s. Says he sometimes does not take the full 40 units on toujeo at bedtime but might take it in the morning. Take Amaryl in the mornings when blood sugar is >150.  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 Usually eats a ham biscuit in the morning and coffee, has bacon for lunch with a boiled egg or a PB and banana sandwich for lunch with higher fiber. Eats out most of the time for dinner with his wife. Has ice cream before bed "most of the time". Says he knows what to do he just needs to do it. Will start counting carbs again.  Has seen a dietician before and may again but wants to try on his own. A1C on 08/27/2016 8.3   Also, his toujeo is not going to be covered on his insurance anymore, basaglar is now preferred for Jan 1st  Restless leg- Goes to bed about 830pm and sleeps until midnight when his medicine wears off. Is currently on mirapex. [facility].  Going to see Dr. Milon DikesJohn Malone.    Hyperlipidemia- Is on Praluent and doing well. Lipids on 08/27/16 LDL 36 Cholesterol 94. Triglycerides 104.   Hypertension- BP is 120/70 today and is doing well on Coreg 6.25 twice daily. No dizziness.     Shortness of breath- Does not think he has COPD but had a bought of bronchitis and was given an inhaler. Is on Qvar. Had PFT done at [facility] last year and was diagnosed with small airway disease.  Pulmonary Function Test (PFT) Latest Ref Rng & Units 11/28/2015  FVC PRE L 4  FVC % PRE PRED % 92.6  FEV1 PRE L 3.13  FEV1 % PRE PRED % 93  FEV1/FVC PRE % 78.26  TLC PRE L 5.55  TLC % PRE PRED % 83.7  RV PRE L 1.4  RV % PRE PRED % 61.8  DLCO PRE ml/(min*mmHg) 19.43  DLCO % PRE PRED % 77   Anemia- Hgb 11.9, says he feels like this could be contributing to his Shortness of breath  Previously diagnosed as iron deficiency.  Not on iron supplement.  Had been on an otc supplement before.  Agreeable to restarting especially when we discussed that low ferritin can worsen RLS.    Declines flu shot.  Says he got the flu after he got the vaccine.  Pneumonia shot made him sick, too.  He's afraid to take the shingles shot.  I could not convince him otherwise--he says I can yell at him if he gets sick.  Past Medical History:  Diagnosis Date  .  Barrett's esophagus    "we've been told that it's all gone; still take RX for GERD" (05/03/2014)  . CAD (coronary artery disease), native coronary artery    Coronary angiogram 05/03/2014:  Proximal LAD 3.0x12 mm Promus premier stent.  04/08/2014: Mid Cx 3.5 x 16 mm Promus DES.  03/29/2013: Mid LAD 2.75 x 38 mm Promus Premier drug-eluting stent, balloon angioplasty of D1 and distal LAD and stenting of distal RCA with 2.75 x 24 mm promos Premier drug-eluting stent 12/15/2012 widely patent.  . Cervicalgia   . Chronic bronchitis (HCC)    "sometimes 2-3 times/yr" (05/03/2014)  . Complication of anesthesia    " I do not wake up very well "  . Family history of adverse reaction to anesthesia    "son w/PONV"  . GERD (gastroesophageal reflux disease)   . H/O hiatal hernia   . Headache    "weekly" (05/03/2014)  . Heart murmur    "grew out of it"   . Hyperlipidemia   .  Hypoglycemia, unspecified   . IDDM (insulin dependent diabetes mellitus) (HCC)   . Impotence of organic origin   . Other malaise and fatigue   . PONV (postoperative nausea and vomiting)   . Restless leg   . Tension headache   . Unspecified gastritis and gastroduodenitis with hemorrhage   . Unstable angina pectoris (HCC) 04/08/2014   Coronary angiogram 05/03/2014:  Proximal LAD 3.0x12 mm Promus premier stent.  04/08/2014: Mid Cx 3.5 x 16 mm Promus DES.  03/29/2013: Mid LAD 2.75 x 38 mm Promus Premier drug-eluting stent, balloon angioplasty of D1 and distal LAD and stenting of distal RCA with 2.75 x 24 mm promos Premier drug-eluting stent 12/15/2012 widely patent.    Past Surgical History:  Procedure Laterality Date  . CARDIAC CATHETERIZATION N/A 09/25/2015   Procedure: Left Heart Cath and Coronary Angiography;  Surgeon: [name], MD;  Location: [facility] INVASIVE CV LAB;  Service: Cardiovascular;  Laterality: N/A;  . CARDIAC CATHETERIZATION N/A 09/25/2015   Procedure: Intravascular Pressure Wire/FFR Study;  Surgeon: [name], MD;  Location: [facility] INVASIVE CV LAB;  Service: Cardiovascular;  Laterality: N/A;  . CORONARY ANGIOPLASTY WITH STENT PLACEMENT  12/15/2012; 04/28/2014; 05/03/2014   "2; 1; 1"   . FRACTIONAL FLOW RESERVE WIRE Right 03/26/2013   Procedure: FRACTIONAL FLOW RESERVE WIRE;  Surgeon: Pamella Pert, MD;  Location: [facility] CATH LAB;  Service: Cardiovascular;  Laterality: Right;  . FRACTIONAL FLOW RESERVE WIRE N/A 05/03/2014   Procedure: FRACTIONAL FLOW RESERVE WIRE;  Surgeon: Pamella Pert, MD;  Location: [facility] CATH LAB;  Service: Cardiovascular;  Laterality: N/A;  . HERNIA REPAIR  2010   "umbilical"   . INCISION AND DRAINAGE ABSCESS Left 05/2007   "groin"   . KNEE SURGERY Right 1992;  2000   "calcium deposits removed" (12/15/2012)  . LEFT HEART CATHETERIZATION WITH CORONARY ANGIOGRAM N/A 12/15/2012   Procedure: LEFT HEART CATHETERIZATION WITH CORONARY ANGIOGRAM;  Surgeon: Pamella Pert, MD;   Location: [facility] CATH LAB;  Service: Cardiovascular;  Laterality: N/A;  . LEFT HEART CATHETERIZATION WITH CORONARY ANGIOGRAM N/A 03/26/2013   Procedure: LEFT HEART CATHETERIZATION WITH CORONARY ANGIOGRAM;  Surgeon: Pamella Pert, MD;  Location: [facility] CATH LAB;  Service: Cardiovascular;  Laterality: N/A;  . LEFT HEART CATHETERIZATION WITH CORONARY ANGIOGRAM N/A 04/08/2014   Procedure: LEFT HEART CATHETERIZATION WITH CORONARY ANGIOGRAM;  Surgeon: Micheline Chapman, MD;  Location: [facility] CATH LAB;  Service: Cardiovascular;  Laterality: N/A;  . PERCUTANEOUS CORONARY STENT INTERVENTION (PCI-S) N/A 05/03/2014  Procedure: PERCUTANEOUS CORONARY STENT INTERVENTION (PCI-S);  Surgeon: Pamella Pert, MD;  Location: [facility] CATH LAB;  Service: Cardiovascular;  Laterality: N/A;  . UMBILICAL HERNIA REPAIR      Allergies  Allergen Reactions  . Lyrica [Pregabalin] Other (See Comments)    Made patient very lethargic the next morning, very hard to patient to function, move, etc.   . Statins Other (See Comments)    Causes Restless Legs, rhabdomyolysis    Outpatient Encounter Prescriptions as of 12/30/2016  Medication Sig  . acetaminophen (TYLENOL) 500 MG tablet Take 1,000-1,500 mg by mouth every 6 (six) hours as needed for headache.  . Alirocumab (PRALUENT) 75 MG/ML SOPN Inject 1 application into the skin every 14 (fourteen) days.  Marland Kitchen aspirin 81 MG EC tablet Take 1 tablet (81 mg total) by mouth daily.  . beclomethasone (QVAR) 40 MCG/ACT inhaler Inhale 1 puff into the lungs 2 (two) times daily.  . carvedilol (COREG) 6.25 MG tablet Take 1/2 tablet by mouth twice daily with meals  . glimepiride (AMARYL) 1 MG tablet TAKE ONE TABLET BY MOUTH ONCE DAILY AS NEEDED TO CONTROL BLOOD SUGAR  . Insulin Glargine (TOUJEO SOLOSTAR) 300 UNIT/ML SOPN Inject 40 Units into the skin at bedtime.  . Insulin Pen Needle (PEN NEEDLES) 32G X 4 MM MISC at bedtime.  . nitroGLYCERIN (NITROSTAT) 0.4 MG SL tablet Place 0.4 mg under the tongue every 5  (five) minutes as needed for chest pain.  . pantoprazole (PROTONIX) 40 MG tablet Take one tablet by mouth twice daily for stomach  . pramipexole (MIRAPEX) 1 MG tablet TAKE 3 TABLETS BY MOUTH AT BEDTIME FOR RESTLESS LEGS  . tizanidine (ZANAFLEX) 2 MG capsule Take 1 capsule (2 mg total) by mouth 2 (two) times daily as needed for muscle spasms.  . vitamin C (ASCORBIC ACID) 500 MG tablet Take 500 mg by mouth daily.  . [DISCONTINUED] BD PEN NEEDLE NANO U/F 32G X 4 MM MISC USE TWICE DAILY WITH LANTUS PEN   No facility-administered encounter medications on file as of 12/30/2016.     Review of Systems:  Review of Systems  Constitutional: Negative for chills and fever.  HENT: Negative for congestion.   Eyes: Negative for blurred vision.  Respiratory: Positive for cough and shortness of breath. Negative for wheezing.        Exertion intolerence  Cardiovascular: Negative for chest pain and palpitations.  Gastrointestinal: Positive for constipation. Negative for abdominal pain, blood in stool and melena.  Genitourinary: Negative for dysuria.  Musculoskeletal: Positive for joint pain.  Skin: Negative for itching and rash.  Neurological: Positive for tremors and weakness.       Unusual tremor (my last note describes in more detail what I've seen neurologically)  Endo/Heme/Allergies: Does not bruise/bleed easily.  Psychiatric/Behavioral: Negative for depression. The patient has insomnia.     Health Maintenance  Topic Date Due  . Hepatitis C Screening  [birth year]  . FOOT EXAM  04/07/2015  . OPHTHALMOLOGY EXAM  12/20/2016  . PNA vac Low Risk Adult (2 of 2 - PPSV23) 03/03/2017 (Originally 12/02/2014)  . INFLUENZA VACCINE  06/01/2017 (Originally 10/02/2016)  . HEMOGLOBIN A1C  06/26/2017  . URINE MICROALBUMIN  08/27/2017  . TETANUS/TDAP  03/05/2021  . COLONOSCOPY  10/18/2025    Physical Exam: Vitals:   12/30/16 1501  BP: 120/70  Pulse: 64  Temp: 98.1 F (36.7 C)  TempSrc: Oral  SpO2: 96%   Weight: 184 lb (83.5 kg)   Body mass index is 27.98 kg/m.  Physical Exam  Constitutional: He is oriented to person, place, and time. He appears well-developed and well-nourished.  HENT:  Head: Normocephalic and atraumatic.  Eyes: Pupils are equal, round, and reactive to light. EOM are normal.  Cardiovascular: Normal rate, regular rhythm, normal heart sounds and intact distal pulses.   Pulmonary/Chest: Effort normal and breath sounds normal. No respiratory distress. He has no wheezes. He has no rales.  Abdominal: Soft. Bowel sounds are normal.  Musculoskeletal: Normal range of motion.  Neurological: He is alert and oriented to person, place, and time. He displays tremor and abnormal reflex. He exhibits abnormal muscle tone.  Tremor that moves through his body, restless and cannot stay still--tremor worse when still and seated  Skin: Skin is warm and dry. Capillary refill takes less than 2 seconds.  Psychiatric: He has a normal mood and affect. His behavior is normal. Judgment and thought content normal.    Labs reviewed: Basic Metabolic Panel:  Recent Labs  16/10/96 0833 08/27/16 0849 12/26/16 0817  NA 140 136 138  K 4.4 4.9 4.3  CL 105 102 104  CO2 28 27 27   GLUCOSE 132* 143* 129*  BUN 19 24 24   CREATININE 1.08 1.05 1.08  CALCIUM 9.3 9.3 9.3   Liver Function Tests:  Recent Labs  04/10/16 04/29/16 0833 12/26/16 0817  AST 26 19 24   ALT 40 20 25  ALKPHOS 48 45  --   BILITOT  --  0.5 0.4  PROT  --  6.7 6.8  ALBUMIN  --  4.3  --    No results for input(s): LIPASE, AMYLASE in the last 8760 hours. No results for input(s): AMMONIA in the last 8760 hours. CBC:  Recent Labs  01/08/16 0838  04/10/16 08/27/16 0849 12/26/16 0817  WBC 5.2  < > 4.1 4.4 4.1  NEUTROABS 3,224  --   --  2,552 2,485  HGB 15.0  < > 13.4* 13.6 11.9*  HCT 43.9  < > 40* 41.0 36.2*  MCV 84.6  --   --  82.0 78.7*  PLT 108*  < > 116* 136* 134*  < > = values in this interval not displayed. Lipid  Panel:  Recent Labs  04/29/16 0833 08/27/16 0849  CHOL 82 94  HDL 35* 37*  LDLCALC 23 36  TRIG 119 104  CHOLHDL 2.3 2.5   Lab Results  Component Value Date   HGBA1C 8.3 (H) 12/26/2016    Assessment/Plan 1. Essential hypertension On coreg and within goal range--cont same and monitor  2. Restless legs syndrome (RLS) On Mirapex but still having problems. Has an appointment with a doctor at [facility] next month.   3. Tremor Follow up with Doctor at [facility] suspect that he has another unusual neurologic illness not simply tremors and RLS--? A parkinson's plus syndrome or other disorder  4. Shortness of breath Previously diagnosed as small airways disease so will restart his qvar  5.  Iron deficiency anemia -restart him on supplement--sent in nu-iron--he's to call if too expensive or not tolerating -tx should help RLS if that's what he really has--I've never seen it affect people all day long like it does him  6.  Small airways disease -Per PFTs with pulmonary at [facility] -restart Qvar--ordered for him  7. 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 CBC with Differential/Platelet    Standing Status:   Future    Standing Expiration Date:   08/30/2017  . Basic metabolic panel    Standing Status:   Future    Standing Expiration Date:   08/30/2017    Order Specific Question:   Has the patient fasted?    Answer:   Yes  . Hemoglobin A1c    Standing Status:   Future    Standing Expiration Date:   08/30/2017    Next appt:  05/05/2017 med mgt, labs  before  Tomica Arseneault L. Lawsyn Heiler, D.O. Geriatrics [facility] [address] [place], [place] [address] Cell Phone (Mon-Fri 8am-5pm):  [phone] On Call:  [phone] & follow prompts after 5pm & weekends Office Phone:  [phone] Office Fax:  [phone]

## 2017-01-04 ENCOUNTER — Other Ambulatory Visit: Payer: Self-pay | Admitting: Internal Medicine

## 2017-01-04 DIAGNOSIS — E1151 Type 2 diabetes mellitus with diabetic peripheral angiopathy without gangrene: Secondary | ICD-10-CM

## 2017-01-04 DIAGNOSIS — J984 Other disorders of lung: Secondary | ICD-10-CM | POA: Insufficient documentation

## 2017-01-28 ENCOUNTER — Other Ambulatory Visit: Payer: Self-pay | Admitting: Internal Medicine

## 2017-01-30 ENCOUNTER — Other Ambulatory Visit: Payer: Self-pay | Admitting: Internal Medicine

## 2017-02-12 DIAGNOSIS — K219 Gastro-esophageal reflux disease without esophagitis: Secondary | ICD-10-CM | POA: Diagnosis not present

## 2017-02-12 DIAGNOSIS — Z955 Presence of coronary angioplasty implant and graft: Secondary | ICD-10-CM | POA: Diagnosis not present

## 2017-02-12 DIAGNOSIS — E119 Type 2 diabetes mellitus without complications: Secondary | ICD-10-CM | POA: Diagnosis not present

## 2017-02-12 DIAGNOSIS — R0683 Snoring: Secondary | ICD-10-CM | POA: Diagnosis not present

## 2017-02-12 DIAGNOSIS — I2510 Atherosclerotic heart disease of native coronary artery without angina pectoris: Secondary | ICD-10-CM | POA: Diagnosis not present

## 2017-02-12 DIAGNOSIS — E785 Hyperlipidemia, unspecified: Secondary | ICD-10-CM | POA: Diagnosis not present

## 2017-02-12 DIAGNOSIS — G2581 Restless legs syndrome: Secondary | ICD-10-CM | POA: Diagnosis not present

## 2017-02-12 DIAGNOSIS — R4781 Slurred speech: Secondary | ICD-10-CM | POA: Diagnosis not present

## 2017-02-12 DIAGNOSIS — R5383 Other fatigue: Secondary | ICD-10-CM | POA: Diagnosis not present

## 2017-02-12 DIAGNOSIS — R413 Other amnesia: Secondary | ICD-10-CM | POA: Diagnosis not present

## 2017-02-12 DIAGNOSIS — D509 Iron deficiency anemia, unspecified: Secondary | ICD-10-CM | POA: Diagnosis not present

## 2017-02-12 DIAGNOSIS — R0681 Apnea, not elsewhere classified: Secondary | ICD-10-CM | POA: Diagnosis not present

## 2017-02-20 ENCOUNTER — Other Ambulatory Visit: Payer: Self-pay | Admitting: *Deleted

## 2017-02-20 MED ORDER — PRAMIPEXOLE DIHYDROCHLORIDE 1 MG PO TABS: ORAL_TABLET | ORAL | 0 refills | Status: DC

## 2017-02-20 NOTE — Telephone Encounter (Signed)
[facility] [address]  90 day supply

## 2017-03-01 ENCOUNTER — Emergency Department: Admit: 2017-03-01 | Payer: MEDICARE | Primary: Family Medicine

## 2017-03-01 ENCOUNTER — Inpatient Hospital Stay
Admit: 2017-03-01 | Discharge: 2017-03-02 | Disposition: A | Discharge status: Home / Self Care | Payer: MEDICARE | Attending: General Practice

## 2017-03-01 DIAGNOSIS — Z7982 Long term (current) use of aspirin: Secondary | ICD-10-CM | POA: Diagnosis not present

## 2017-03-01 DIAGNOSIS — D509 Iron deficiency anemia, unspecified: Secondary | ICD-10-CM | POA: Diagnosis not present

## 2017-03-01 DIAGNOSIS — E785 Hyperlipidemia, unspecified: Secondary | ICD-10-CM | POA: Diagnosis not present

## 2017-03-01 DIAGNOSIS — S61213A Laceration without foreign body of left middle finger without damage to nail, initial encounter: Secondary | ICD-10-CM | POA: Diagnosis not present

## 2017-03-01 DIAGNOSIS — S61211A Laceration without foreign body of left index finger without damage to nail, initial encounter: Secondary | ICD-10-CM | POA: Diagnosis not present

## 2017-03-01 DIAGNOSIS — Z794 Long term (current) use of insulin: Secondary | ICD-10-CM | POA: Diagnosis not present

## 2017-03-01 DIAGNOSIS — G2581 Restless legs syndrome: Secondary | ICD-10-CM | POA: Diagnosis not present

## 2017-03-01 DIAGNOSIS — S61412A Laceration without foreign body of left hand, initial encounter: Secondary | ICD-10-CM | POA: Diagnosis not present

## 2017-03-01 DIAGNOSIS — Z79899 Other long term (current) drug therapy: Secondary | ICD-10-CM | POA: Diagnosis not present

## 2017-03-01 DIAGNOSIS — E119 Type 2 diabetes mellitus without complications: Secondary | ICD-10-CM | POA: Diagnosis not present

## 2017-03-01 DIAGNOSIS — Z23 Encounter for immunization: Secondary | ICD-10-CM | POA: Diagnosis not present

## 2017-03-01 MED ORDER — TETANUS-DIPHTHERIA TOXOIDS-TD 2 LF UNIT-2 LF UNIT/0.5 ML IM SUSP
2-2 Lf unit/0.5 mL | Freq: Once | INTRAMUSCULAR | Status: AC
Start: 2017-03-01 — End: 2017-03-01
  Administered 2017-03-01: 23:00:00 via INTRAMUSCULAR

## 2017-03-01 NOTE — ED Notes (Signed)
Discharge instructions reviewed with pt  Pt verbalized understanding  Pt discharged with belongings

## 2017-03-01 NOTE — ED Provider Notes (Addendum)
[facility]  Emergency Department Treatment Report    Patient: Greg Watkins Age: 66 y.o. Sex: male    Date of Birth: [birth year] Admit Date: 03/01/2017 PCP: Sol Blazingther, Phys, MD   MRN: [id number]  CSN: [id number]  MD: Romero LinerHERRY, DARREN C, DO   Room: ER50/ER50 Time Dictated: 6:39 PM APP: Gerhardt Gleed       Chief Complaint   Laceration  History of Present Illness   66 y.o. male presenting after he sustained a laceration to 2nd and 3rd finger of left hand from knife. Denies numbness or tingling but has pain around laceration site. Bleeding controlled. Not on anticoagulation but takes ASA. Has prior injuries to left hand. Unknown last tetanus.    Review of Systems   Review of Systems   Constitutional: Negative for chills and fever.   Respiratory: Negative for shortness of breath.    Cardiovascular: Negative for chest pain.   Gastrointestinal: Negative for abdominal pain, nausea and vomiting.   Skin: Negative for rash.   Neurological: Negative for sensory change, speech change, focal weakness and headaches.       Past Medical/Surgical History   DM  HLD  Iron deficiency anemia  Small airway disease  History of lacerations  Restless leg syndrome    Social History     Social History     Socioeconomic History   ??? Marital status: MARRIED     Spouse name: Not on file   ??? Number of children: Not on file   ??? Years of education: Not on file   ??? Highest education level: Not on file       Family History   History reviewed. No pertinent family history.    Current Medications     ASA  Coreg  Qvar  Iron supplement  Insulin  Allergies     Allergies   Allergen Reactions   ??? Statins-Hmg-Coa Reductase Inhibitors Other (comments)     Convulsions       Physical Exam     Visit Vitals  BP 139/80 (BP 1 Location: Right arm, BP Patient Position: Sitting)   Pulse 65   Temp 97.7 ??F (36.5 ??C)   Resp 18   Ht 5\' 8"  (1.727 m)   Wt 83.5 kg (184 lb)   SpO2 96%   BMI 27.98 kg/m??     Physical Exam    Constitutional: He is oriented to person, place, and time and well-developed, well-nourished, and in no distress.   HENT:   Head: Normocephalic and atraumatic.   Mouth/Throat: Oropharynx is clear and moist.   Eyes: Conjunctivae are normal. Pupils are equal, round, and reactive to light.   Neck: Normal range of motion. Neck supple.   Cardiovascular: Normal rate, regular rhythm, normal heart sounds and intact distal pulses.   Pulmonary/Chest: Effort normal and breath sounds normal. He has no wheezes.   Abdominal: Soft. Bowel sounds are normal. There is no tenderness.   Musculoskeletal: He exhibits no edema.   Lymphadenopathy:     He has no cervical adenopathy.   Neurological: He is alert and oriented to person, place, and time. Gait normal. GCS score is 15.   Skin: Skin is warm and dry. No rash noted. He is not diaphoretic.   Multiple lacerations to 2nd and 3rd digit left hand on ulnar and radial sides   Psychiatric: Memory normal.   Vitals reviewed.        Impression and Management Plan   [age] year old male with laceration to second and third  digit of left hand  Obtain x-ray.  We will give tetanus and suture laceration    Diagnostic Studies   Lab:   No results found for this or any previous visit (from the past 12 hour(s)).    Imaging:    Xr Hand Lt Min 3 V    Result Date: 03/01/2017  Indication: Cut with knife     IMPRESSION: 3 views of the left hand reveal no fracture, dislocation or foreign body. Soft tissue swelling and laceration is noted about the index finger.       Medical Decision Making/ED Course   Patient will return in 14 days for suture removal.  [place] but plans to return for suture removal.  Advised patient if he is unable to return he can go to ED or primary care doctor in his hometown. He will return to an ED or PCP sooner if he notices redness, purulent drainage, or fever. Patient remained clinically stable throughout  the Emergency Department visit.  Vitals were unremarkable and the patient's condition required no further ED intervention.  The patient is stable for outpatient management with appropriate symptomatic treatment, return precautions, and was advised of the importance of outpatient follow up for ongoing care    Wound Repair  Date/Time: 03/01/2017 10:33 PM  Performed by: PAPreparation: skin prepped with alcohol and sterile field established  Pre-procedure re-eval: Immediately prior to the procedure, the patient was reevaluated and found suitable for the planned procedure and any planned medications.  Location details: left index finger and left long finger  Wound length:2.6 - 7.5 cm  Anesthesia: digital block (2nd and 3rd digit)    Anesthesia:  Local Anesthetic: bupivacaine 0.25% without epinephrine and lidocaine 2% without epinephrine  Anesthetic total: 10 mL  Irrigation solution: saline  Irrigation method: syringe  Skin closure: 6-0 nylon  Number of sutures: 13  Technique: simple and interrupted  Approximation: close  Dressing: buddy wrapped 2nd and 3rd digits.  Patient tolerance: Patient tolerated the procedure well with no immediate complications  My total time at bedside, performing this procedure was 31-45 minutes.      Medications   tetanus-diphtheria toxoids-Td 2-2 Lf unit/0.5 mL injection 0.5 mL (0.5 mL IntraMUSCular Given 03/01/17 1751)     Final Diagnosis       ICD-10-CM ICD-9-CM   1. Laceration of left index finger without foreign body without damage to nail, initial encounter S61.211A 883.0   2. Laceration of left middle finger without foreign body without damage to nail, initial encounter S61.213A 883.0     Disposition     home      Patient was discharged home in stable condition with discharge instructions on the same.     Return to the ER if condition worsens or new symptoms develop.   Follow up with primary care as discussed.     The patient was personally evaluated by myself and  CHERRY, DARREN C, DO  who agrees with the above assessment and plan.  Velia MeyerJessica Adamarys Shall, PA-C  March 01, 2017      *Portions of this electronic record were dictated using Conservation officer, historic buildingsDragon voice recognition software.  Unintended errors in translation may occur.    My signature above authenticates this document and my orders, the final ??  diagnosis (es), discharge prescription (s), and instructions in the Epic ??  record.  If you have any questions please contact [phone].  ??  Nursing notes have been reviewed by the physician/ advanced practice ??  Clinician.

## 2017-03-01 NOTE — ED Triage Notes (Signed)
Pt presents with laceration to left hand; bandaged upon arrival & bleeding controlled. Stated he was opening a box with a pocket knife & the knife slipped. Unsure of last tetanus shot.

## 2017-03-05 DIAGNOSIS — R4781 Slurred speech: Secondary | ICD-10-CM | POA: Diagnosis not present

## 2017-03-05 DIAGNOSIS — R200 Anesthesia of skin: Secondary | ICD-10-CM | POA: Diagnosis not present

## 2017-03-05 DIAGNOSIS — R413 Other amnesia: Secondary | ICD-10-CM | POA: Diagnosis not present

## 2017-03-10 ENCOUNTER — Telehealth: Payer: Self-pay | Admitting: *Deleted

## 2017-03-10 NOTE — Telephone Encounter (Signed)
Patient called and left message on Clinical Intake and stated that his [name] is no longer covered by his insurance.   I called patient back and LMOM for him to call his insurance company to see what is covered that is comparable to the Toujeo and call us back. Awaiting call.

## 2017-03-12 DIAGNOSIS — S61213A Laceration without foreign body of left middle finger without damage to nail, initial encounter: Secondary | ICD-10-CM | POA: Diagnosis not present

## 2017-03-12 DIAGNOSIS — S61211A Laceration without foreign body of left index finger without damage to nail, initial encounter: Secondary | ICD-10-CM | POA: Diagnosis not present

## 2017-03-14 ENCOUNTER — Other Ambulatory Visit: Payer: Self-pay | Admitting: *Deleted

## 2017-03-14 MED ORDER — PRAMIPEXOLE DIHYDROCHLORIDE 1 MG PO TABS: ORAL_TABLET | ORAL | 0 refills | Status: DC

## 2017-03-14 NOTE — Telephone Encounter (Signed)
[facility] [address] 

## 2017-03-19 MED ORDER — BASAGLAR KWIKPEN 100 UNIT/ML ~~LOC~~ SOPN: 40.0000 [IU] | PEN_INJECTOR | Freq: Every day | SUBCUTANEOUS | 3 refills | Status: DC

## 2017-03-19 NOTE — Telephone Encounter (Signed)
LMOM for [facility] to [facility] regarding his dosing of his current insulin.

## 2017-03-19 NOTE — Telephone Encounter (Signed)
Patient takes 40 units once daily. Medication list updated and Rx faxed to pharmacy.

## 2017-03-19 NOTE — Telephone Encounter (Signed)
Change to basaglar.  Please confirm how much patient is currently taking to be sure enough pens are dispensed. He has self-adjusted in the past.

## 2017-03-19 NOTE — Telephone Encounter (Signed)
Received fax from [facility] [address] stating Patient is requesting an alternative due to insurance will no longer pay for [employer], Pharmacy states to consider Basaglar or Guinea-Bissauresiba. Please Advise.

## 2017-03-21 ENCOUNTER — Other Ambulatory Visit: Payer: Self-pay | Admitting: *Deleted

## 2017-03-21 NOTE — Telephone Encounter (Signed)
Patient called and stated that his insulin needles will not fit on his Basaglar Pen.   I called and [facility] and she stated for patient to come up there and ask for her because all pen needles should fit.   Patient notified and will go ask for her.

## 2017-04-02 DIAGNOSIS — M1812 Unilateral primary osteoarthritis of first carpometacarpal joint, left hand: Secondary | ICD-10-CM | POA: Diagnosis not present

## 2017-04-02 DIAGNOSIS — T148XXA Other injury of unspecified body region, initial encounter: Secondary | ICD-10-CM | POA: Diagnosis not present

## 2017-04-07 DIAGNOSIS — R0602 Shortness of breath: Secondary | ICD-10-CM | POA: Diagnosis not present

## 2017-04-07 DIAGNOSIS — I2510 Atherosclerotic heart disease of native coronary artery without angina pectoris: Secondary | ICD-10-CM | POA: Diagnosis not present

## 2017-04-07 DIAGNOSIS — E782 Mixed hyperlipidemia: Secondary | ICD-10-CM | POA: Diagnosis not present

## 2017-04-07 DIAGNOSIS — Z9861 Coronary angioplasty status: Secondary | ICD-10-CM | POA: Diagnosis not present

## 2017-04-24 ENCOUNTER — Other Ambulatory Visit: Payer: Medicare Other

## 2017-04-25 ENCOUNTER — Other Ambulatory Visit: Payer: Medicare Other

## 2017-04-25 ENCOUNTER — Other Ambulatory Visit: Payer: Self-pay

## 2017-04-25 ENCOUNTER — Ambulatory Visit (INDEPENDENT_AMBULATORY_CARE_PROVIDER_SITE_OTHER): Payer: Medicare Other

## 2017-04-25 VITALS — BP 132/78 | HR 58 | Temp 97.5°F | Ht 68.0 in | Wt 184.0 lb

## 2017-04-25 DIAGNOSIS — IMO0002 Reserved for concepts with insufficient information to code with codable children: Secondary | ICD-10-CM

## 2017-04-25 DIAGNOSIS — Z794 Long term (current) use of insulin: Secondary | ICD-10-CM | POA: Diagnosis not present

## 2017-04-25 DIAGNOSIS — D509 Iron deficiency anemia, unspecified: Secondary | ICD-10-CM | POA: Diagnosis not present

## 2017-04-25 DIAGNOSIS — E1159 Type 2 diabetes mellitus with other circulatory complications: Secondary | ICD-10-CM | POA: Diagnosis not present

## 2017-04-25 DIAGNOSIS — Z0000 Encounter for general adult medical examination without abnormal findings: Secondary | ICD-10-CM

## 2017-04-25 DIAGNOSIS — E1165 Type 2 diabetes mellitus with hyperglycemia: Principal | ICD-10-CM

## 2017-04-25 NOTE — Patient Instructions (Signed)
[name] , Thank you for taking time to come for your Medicare Wellness Visit. I appreciate your ongoing commitment to your health goals. Please review the following plan we discussed and let me know if I can assist you in the future.   Screening recommendations/referrals: Colonoscopy up to date, due 10/18/2025 Recommended yearly ophthalmology/optometry visit for glaucoma screening and checkup Recommended yearly dental visit for hygiene and checkup  Vaccinations: Influenza vaccine up to date, due 2019 fall season Pneumococcal vaccine due, declined for now Tdap vaccine up to date, due 03/05/2021 Shingles vaccine due, declined for now  Advanced directives: Advance directive discussed with you today. I have provided a copy for you to complete at home and have notarized. Once this is complete please bring a copy in to our office so we can scan it into your chart.  Conditions/risks identified: none  Next appointment: Dr. Renato Gailseed 05/05/2017 @ 8:30am             Tyron RussellSara Saunders, RN 04/29/2018 @ 9:15am  Preventive Care 68 Years and Older, Male Preventive care refers to lifestyle choices and visits with your health care provider that can promote health and wellness. What does preventive care include?  A yearly physical exam. This is also called an annual well check.  Dental exams once or twice a year.  Routine eye exams. Ask your health care provider how often you should have your eyes checked.  Personal lifestyle choices, including:  Daily care of your teeth and gums.  Regular physical activity.  Eating a healthy diet.  Avoiding tobacco and drug use.  Limiting alcohol use.  Practicing safe sex.  Taking low doses of aspirin every day.  Taking vitamin and mineral supplements as recommended by your health care provider. What happens during an annual well check? The services and screenings done by your health care provider during your annual well check will depend on your age, overall  health, lifestyle risk factors, and family history of disease. Counseling  Your health care provider may ask you questions about your:  Alcohol use.  Tobacco use.  Drug use.  Emotional well-being.  Home and relationship well-being.  Sexual activity.  Eating habits.  History of falls.  Memory and ability to understand (cognition).  Work and work Astronomerenvironment. Screening  You may have the following tests or measurements:  Height, weight, and BMI.  Blood pressure.  Lipid and cholesterol levels. These may be checked every 5 years, or more frequently if you are over 68 years old.  Skin check.  Lung cancer screening. You may have this screening every year starting at age 68 if you have a 30-pack-year history of smoking and currently smoke or have quit within the past 15 years.  Fecal occult blood test (FOBT) of the stool. You may have this test every year starting at age 68.  Flexible sigmoidoscopy or colonoscopy. You may have a sigmoidoscopy every 5 years or a colonoscopy every 10 years starting at age 68.  Prostate cancer screening. Recommendations will vary depending on your family history and other risks.  Hepatitis C blood test.  Hepatitis B blood test.  Sexually transmitted disease (STD) testing.  Diabetes screening. This is done by checking your blood sugar (glucose) after you have not eaten for a while (fasting). You may have this done every 1-3 years.  Abdominal aortic aneurysm (AAA) screening. You may need this if you are a current or former smoker.  Osteoporosis. You may be screened starting at age 68 if you are  at high risk. Talk with your health care provider about your test results, treatment options, and if necessary, the need for more tests. Vaccines  Your health care provider may recommend certain vaccines, such as:  Influenza vaccine. This is recommended every year.  Tetanus, diphtheria, and acellular pertussis (Tdap, Td) vaccine. You may need a Td  booster every 10 years.  Zoster vaccine. You may need this after age 26.  Pneumococcal 13-valent conjugate (PCV13) vaccine. One dose is recommended after age 68.  Pneumococcal polysaccharide (PPSV23) vaccine. One dose is recommended after age 68. Talk to your health care provider about which screenings and vaccines you need and how often you need them. This information is not intended to replace advice given to you by your health care provider. Make sure you discuss any questions you have with your health care provider. Document Released: 03/17/2015 Document Revised: 11/08/2015 Document Reviewed: 12/20/2014 Elsevier Interactive Patient Education  2017 [employer] Prevention in the Home Falls can cause injuries. They can happen to people of all ages. There are many things you can do to make your home safe and to help prevent falls. What can I do on the outside of my home?  Regularly fix the edges of walkways and driveways and fix any cracks.  Remove anything that might make you trip as you walk through a door, such as a raised step or threshold.  Trim any bushes or trees on the path to your home.  Use bright outdoor lighting.  Clear any walking paths of anything that might make someone trip, such as rocks or tools.  Regularly check to see if handrails are loose or broken. Make sure that both sides of any steps have handrails.  Any raised decks and porches should have guardrails on the edges.  Have any leaves, snow, or ice cleared regularly.  Use sand or salt on walking paths during winter.  Clean up any spills in your garage right away. This includes oil or grease spills. What can I do in the bathroom?  Use night lights.  Install grab bars by the toilet and in the tub and shower. Do not use towel bars as grab bars.  Use non-skid mats or decals in the tub or shower.  If you need to sit down in the shower, use a plastic, non-slip stool.  Keep the floor dry. Clean up  any water that spills on the floor as soon as it happens.  Remove soap buildup in the tub or shower regularly.  Attach bath mats securely with double-sided non-slip rug tape.  Do not have throw rugs and other things on the floor that can make you trip. What can I do in the bedroom?  Use night lights.  Make sure that you have a light by your bed that is easy to reach.  Do not use any sheets or blankets that are too big for your bed. They should not hang down onto the floor.  Have a firm chair that has side arms. You can use this for support while you get dressed.  Do not have throw rugs and other things on the floor that can make you trip. What can I do in the kitchen?  Clean up any spills right away.  Avoid walking on wet floors.  Keep items that you use a lot in easy-to-reach places.  If you need to reach something above you, use a strong step stool that has a grab bar.  Keep electrical cords out of  the way.  Do not use floor polish or wax that makes floors slippery. If you must use wax, use non-skid floor wax.  Do not have throw rugs and other things on the floor that can make you trip. What can I do with my stairs?  Do not leave any items on the stairs.  Make sure that there are handrails on both sides of the stairs and use them. Fix handrails that are broken or loose. Make sure that handrails are as long as the stairways.  Check any carpeting to make sure that it is firmly attached to the stairs. Fix any carpet that is loose or worn.  Avoid having throw rugs at the top or bottom of the stairs. If you do have throw rugs, attach them to the floor with carpet tape.  Make sure that you have a light switch at the top of the stairs and the bottom of the stairs. If you do not have them, ask someone to add them for you. What else can I do to help prevent falls?  Wear shoes that:  Do not have high heels.  Have rubber bottoms.  Are comfortable and fit you well.  Are  closed at the toe. Do not wear sandals.  If you use a stepladder:  Make sure that it is fully opened. Do not climb a closed stepladder.  Make sure that both sides of the stepladder are locked into place.  Ask someone to hold it for you, if possible.  Clearly mark and make sure that you can see:  Any grab bars or handrails.  First and last steps.  Where the edge of each step is.  Use tools that help you move around (mobility aids) if they are needed. These include:  Canes.  Walkers.  Scooters.  Crutches.  Turn on the lights when you go into a dark area. Replace any light bulbs as soon as they burn out.  Set up your furniture so you have a clear path. Avoid moving your furniture around.  If any of your floors are uneven, fix them.  If there are any pets around you, be aware of where they are.  Review your medicines with your doctor. Some medicines can make you feel dizzy. This can increase your chance of falling. Ask your doctor what other things that you can do to help prevent falls. This information is not intended to replace advice given to you by your health care provider. Make sure you discuss any questions you have with your health care provider. Document Released: 12/15/2008 Document Revised: 07/27/2015 Document Reviewed: 03/25/2014 Elsevier Interactive Patient Education  2017 [employer].

## 2017-04-25 NOTE — Progress Notes (Addendum)
Subjective:   Kenneth Forse Sr. is a 67 y.o. male who presents for Medicare Annual/Subsequent preventive examination.  Last AWV-01/10/2016       Objective:    Vitals: BP 132/78 (BP Location: Left Arm, Patient Position: Sitting)   Pulse (!) 58   Temp (!) 97.5 F (36.4 C) (Oral)   Ht 5\' 8"  (1.727 m)   Wt 184 lb (83.5 kg)   SpO2 98%   BMI 27.98 kg/m   Body mass index is 27.98 kg/m.  Advanced Directives 04/25/2017 08/29/2016 05/01/2016 01/10/2016 01/10/2016 11/01/2015 10/11/2015  Does Patient Have a Medical Advance Directive? No No No No No No No  Does patient want to make changes to medical advance directive? - - - - - - -  Would patient like information on creating a medical advance directive? Yes (MAU/Ambulatory/Procedural Areas - Information given) No - Patient declined - - No - patient declined information - -  Pre-existing out of facility DNR order (yellow form or pink MOST form) - - - - - - -    Tobacco Social History   Tobacco Use  Smoking Status Never Smoker  Smokeless Tobacco Never Used     Counseling given: Not Answered   Clinical Intake:  Pre-visit preparation completed: No  Pain : 0-10 Pain Score: 3  Pain Type: Acute pain Pain Location: Foot Pain Orientation: Right Pain Descriptors / Indicators: Sharp Pain Onset: In the past 7 days Pain Frequency: Intermittent     Nutritional Risks: None Diabetes: Yes CBG done?: No Did pt. bring in CBG monitor from home?: No  How often do you need to have someone help you when you read instructions, pamphlets, or other written materials from your doctor or pharmacy?: 1 - Never What is the last grade level you completed in school?: HS  Interpreter Needed?: No  Information entered by :: Tyron Russell, RN  Past Medical History:  Diagnosis Date  . Barrett's esophagus    "we've been told that it's all gone; still take RX for GERD" (05/03/2014)  . CAD (coronary artery disease), native coronary artery    Coronary angiogram  05/03/2014:  Proximal LAD 3.0x12 mm Promus premier stent.  04/08/2014: Mid Cx 3.5 x 16 mm Promus DES.  03/29/2013: Mid LAD 2.75 x 38 mm Promus Premier drug-eluting stent, balloon angioplasty of D1 and distal LAD and stenting of distal RCA with 2.75 x 24 mm promos Premier drug-eluting stent 12/15/2012 widely patent.  . Cervicalgia   . Chronic bronchitis (HCC)    "sometimes 2-3 times/yr" (05/03/2014)  . Complication of anesthesia    " I do not wake up very well "  . Family history of adverse reaction to anesthesia    "son w/PONV"  . GERD (gastroesophageal reflux disease)   . H/O hiatal hernia   . Headache    "weekly" (05/03/2014)  . Heart murmur    "grew out of it"   . Hyperlipidemia   . Hypoglycemia, unspecified   . IDDM (insulin dependent diabetes mellitus) (HCC)   . Impotence of organic origin   . Other malaise and fatigue   . PONV (postoperative nausea and vomiting)   . Restless leg   . Tension headache   . Unspecified gastritis and gastroduodenitis with hemorrhage   . Unstable angina pectoris (HCC) 04/08/2014   Coronary angiogram 05/03/2014:  Proximal LAD 3.0x12 mm Promus premier stent.  04/08/2014: Mid Cx 3.5 x 16 mm Promus DES.  03/29/2013: Mid LAD 2.75 x 38 mm Promus Premier drug-eluting stent,  balloon angioplasty of D1 and distal LAD and stenting of distal RCA with 2.75 x 24 mm promos Premier drug-eluting stent 12/15/2012 widely patent.   Past Surgical History:  Procedure Laterality Date  . CARDIAC CATHETERIZATION N/A 09/25/2015   Procedure: Left Heart Cath and Coronary Angiography;  Surgeon: [name], MD;  Location: [facility] INVASIVE CV LAB;  Service: Cardiovascular;  Laterality: N/A;  . CARDIAC CATHETERIZATION N/A 09/25/2015   Procedure: Intravascular Pressure Wire/FFR Study;  Surgeon: [name], MD;  Location: [facility] INVASIVE CV LAB;  Service: Cardiovascular;  Laterality: N/A;  . CORONARY ANGIOPLASTY WITH STENT PLACEMENT  12/15/2012; 04/28/2014; 05/03/2014   "2; 1; 1"   . FRACTIONAL FLOW RESERVE WIRE  Right 03/26/2013   Procedure: FRACTIONAL FLOW RESERVE WIRE;  Surgeon: Pamella Pert, MD;  Location[facility] CATH LAB;  Service: Cardiovascular;  Laterality: Right;  . FRACTIONAL FLOW RESERVE WIRE N/A 05/03/2014   Procedure: FRACTIONAL FLOW RESERVE WIRE;  Surgeon: Pamella Pert, MD;  Location: [facility] CATH LAB;  Service: Cardiovascular;  Laterality: N/A;  . HERNIA REPAIR  2010   "umbilical"   . INCISION AND DRAINAGE ABSCESS Left 05/2007   "groin"   . KNEE SURGERY Right 1992;  2000   "calcium deposits removed" (12/15/2012)  . LEFT HEART CATHETERIZATION WITH CORONARY ANGIOGRAM N/A 12/15/2012   Procedure: LEFT HEART CATHETERIZATION WITH CORONARY ANGIOGRAM;  Surgeon: Pamella Pert, MD;  Location[facility] CATH LAB;  Service: Cardiovascular;  Laterality: N/A;  . LEFT HEART CATHETERIZATION WITH CORONARY ANGIOGRAM N/A 03/26/2013   Procedure: LEFT HEART CATHETERIZATION WITH CORONARY ANGIOGRAM;  Surgeon: Pamella Pert, MD;  Location: [facility] CATH LAB;  Service: Cardiovascular;  Laterality: N/A;  . LEFT HEART CATHETERIZATION WITH CORONARY ANGIOGRAM N/A 04/08/2014   Procedure: LEFT HEART CATHETERIZATION WITH CORONARY ANGIOGRAM;  Surgeon: Micheline Chapman, MD;  Location: [facility] CATH LAB;  Service: Cardiovascular;  Laterality: N/A;  . PERCUTANEOUS CORONARY STENT INTERVENTION (PCI-S) N/A 05/03/2014   Procedure: PERCUTANEOUS CORONARY STENT INTERVENTION (PCI-S);  Surgeon: Pamella Pert, MD;  Location: [facility] CATH LAB;  Service: Cardiovascular;  Laterality: N/A;  . UMBILICAL HERNIA REPAIR     Family History  Adopted: Yes  Family history unknown: Yes   Social History   Socioeconomic History  . Marital status: Married    Spouse name: None  . Number of children: None  . Years of education: None  . Highest education level: None  Social Needs  . Financial resource strain: Not hard at all  . Food insecurity - worry: Never true  . Food insecurity - inability: Never true  . Transportation needs - medical: No  .  Transportation needs - non-medical: No  Occupational History  . None  Tobacco Use  . Smoking status: Never Smoker  . Smokeless tobacco: Never Used  Substance and Sexual Activity  . Alcohol use: No  . Drug use: No  . Sexual activity: No  Other Topics Concern  . None  Social History Narrative  . None    Outpatient Encounter Medications as of 04/25/2017  Medication Sig  . acetaminophen (TYLENOL) 500 MG tablet Take 1,000-1,500 mg by mouth every 6 (six) hours as needed for headache.  . Alirocumab (PRALUENT) 75 MG/ML SOPN Inject 1 application into the skin every 14 (fourteen) days.  Marland Kitchen aspirin 81 MG EC tablet Take 1 tablet (81 mg total) by mouth daily.  . beclomethasone (QVAR) 40 MCG/ACT inhaler Inhale 1 puff into the lungs 2 (two) times daily.  . carvedilol (COREG) 6.25 MG tablet Take 1/2  tablet by mouth twice daily with meals  . fluticasone (FLONASE) 50 MCG/ACT nasal spray PLACE 1 SPRAY IN EACH NOSTRIL EVERY DAY  . glimepiride (AMARYL) 1 MG tablet TAKE ONE TABLET BY MOUTH ONCE DAILY AS NEEDED TO CONTROL BLOOD SUGAR  . Insulin Glargine (BASAGLAR KWIKPEN) 100 UNIT/ML SOPN Inject 0.4 mLs (40 Units total) into the skin at bedtime. (Patient taking differently: Inject 40 Units into the skin at bedtime. )  . Insulin Pen Needle (PEN NEEDLES) 32G X 4 MM MISC at bedtime.  . nitroGLYCERIN (NITROSTAT) 0.4 MG SL tablet Place 0.4 mg under the tongue every 5 (five) minutes as needed for chest pain.  . pantoprazole (PROTONIX) 40 MG tablet Take one tablet by mouth twice daily for stomach  . pramipexole (MIRAPEX) 1 MG tablet Take three tablets by mouth at bedtime for restless legs  . tizanidine (ZANAFLEX) 2 MG capsule Take 1 capsule (2 mg total) by mouth 2 (two) times daily as needed for muscle spasms.  . vitamin C (ASCORBIC ACID) 500 MG tablet Take 500 mg by mouth daily.  . iron polysaccharides (NU-IRON) 150 MG capsule Take 1 capsule (150 mg total) by mouth daily. (Patient not taking: Reported on 04/25/2017)    No facility-administered encounter medications on file as of 04/25/2017.     Activities of Daily Living In your present state of health, do you have any difficulty performing the following activities: 04/25/2017  Hearing? Y  Vision? N  Difficulty concentrating or making decisions? Y  Walking or climbing stairs? N  Dressing or bathing? N  Doing errands, shopping? N  Preparing Food and eating ? N  Using the Toilet? N  In the past six months, have you accidently leaked urine? Y  Do you have problems with loss of bowel control? N  Managing your Medications? N  Managing your Finances? N  Housekeeping or managing your Housekeeping? N  Some recent data might be hidden    Patient Care Team: Kermit Balo, DO as PCP - General (Geriatric Medicine) [name], MD as Consulting Physician (Cardiology) Van Clines, MD as Consulting Physician (Neurology) [name], [name], OD (Optometry) [name], MD as Consulting Physician (Otolaryngology)   Assessment:   This is a routine wellness examination for [name].  Exercise Activities and Dietary recommendations Current Exercise Habits: The patient does not participate in regular exercise at present, Exercise limited by: None identified  Goals    . Exercise 3x per week (30 min per time)     Pt will start walking 3 times a week       Fall Risk Fall Risk  04/25/2017 12/30/2016 08/29/2016 01/10/2016 07/05/2015  Falls in the past year? Yes No - No No  Number falls in past yr: 2 or more - 1 - -  Injury with Fall? No - Yes - -   Is the patient's home free of loose throw rugs in walkways, pet beds, electrical cords, etc?   yes      Grab bars in the bathroom? no      Handrails on the stairs?   yes      Adequate lighting?   yes  Timed Get Up and Go Performed: 16 seconds, fall risk  Depression Screen PHQ 2/9 Scores 04/25/2017 12/30/2016 08/29/2016 01/10/2016  PHQ - 2 Score 0 0 0 0    Cognitive Function MMSE - Mini Mental State Exam  04/25/2017 01/10/2016  Orientation to time 5 5  Orientation to Place 5 5  Registration 3 3  Attention/ Calculation 2 5  Recall 2 3  Language- name 2 objects 2 2  Language- repeat 1 1  Language- follow 3 step command 3 3  Language- read & follow direction 1 1  Write a sentence 1 1  Copy design 1 1  Total score 26 30        Immunization History  Administered Date(s) Administered  . Pneumococcal Conjugate-13 12/01/2013  . Td 03/04/1998  . Tdap 03/06/2011, 02/28/2017    Qualifies for Shingles Vaccine? Yes, educated and declined for now  Screening Tests Health Maintenance  Topic Date Due  . Hepatitis C Screening  February 23, 1951  . PNA vac Low Risk Adult (2 of 2 - PPSV23) 12/02/2014  . FOOT EXAM  04/07/2015  . OPHTHALMOLOGY EXAM  12/20/2016  . INFLUENZA VACCINE  06/01/2017 (Originally 10/02/2016)  . HEMOGLOBIN A1C  06/26/2017  . URINE MICROALBUMIN  08/27/2017  . TETANUS/TDAP  03/05/2021  . COLONOSCOPY  10/18/2025   Cancer Screenings: Lung: Low Dose CT Chest recommended if Age 57-80 years, 30 pack-year currently smoking OR have quit w/in 15years. Patient does not qualify. Colorectal: up to date  Additional Screenings:  Hepatitis C Screening: declined PNA 23 due, pt declined due to past reaction Last Diabetic Eye exam-12/2016    Plan:    I have personally reviewed and addressed the Medicare Annual Wellness questionnaire and have noted the following in the patient's chart:  A. Medical and social history B. Use of alcohol, tobacco or illicit drugs  C. Current medications and supplements D. Functional ability and status E.  Nutritional status F.  Physical activity G. Advance directives H. List of other physicians I.  Hospitalizations, surgeries, and ER visits in previous 12 months J.  Vitals K. Screenings to include hearing, vision, cognitive, depression L. Referrals and appointments - none  In addition, I have reviewed and discussed with patient certain preventive  protocols, quality metrics, and best practice recommendations. A written personalized care plan for preventive services as well as general preventive health recommendations were provided to patient.  See attached scanned questionnaire for additional information.   Signed,   Tyron RussellSara Saunders, RN Nurse Health Advisor  Patient Concerns:Urgency and frequency of urine has increased since he started his new insulin

## 2017-04-26 LAB — BASIC METABOLIC PANEL
BUN: 18 mg/dL (ref 7–25)
CO2: 26 mmol/L (ref 20–32)
Calcium: 9.3 mg/dL (ref 8.6–10.3)
Chloride: 104 mmol/L (ref 98–110)
Creat: 1.08 mg/dL (ref 0.70–1.25)
Glucose, Bld: 111 mg/dL — ABNORMAL HIGH (ref 65–99)
Potassium: 4.5 mmol/L (ref 3.5–5.3)
Sodium: 139 mmol/L (ref 135–146)

## 2017-04-26 LAB — CBC WITH DIFFERENTIAL/PLATELET
Basophils Absolute: 29 cells/uL (ref 0–200)
Basophils Relative: 0.6 %
Eosinophils Absolute: 93 cells/uL (ref 15–500)
Eosinophils Relative: 1.9 %
HCT: 41.3 % (ref 38.5–50.0)
Hemoglobin: 13.8 g/dL (ref 13.2–17.1)
Lymphs Abs: 1382 cells/uL (ref 850–3900)
MCH: 26.8 pg — ABNORMAL LOW (ref 27.0–33.0)
MCHC: 33.4 g/dL (ref 32.0–36.0)
MCV: 80.4 fL (ref 80.0–100.0)
MPV: 11.7 fL (ref 7.5–12.5)
Monocytes Relative: 9.3 %
Neutro Abs: 2940 cells/uL (ref 1500–7800)
Neutrophils Relative %: 60 %
Platelets: 142 10*3/uL (ref 140–400)
RBC: 5.14 10*6/uL (ref 4.20–5.80)
RDW: 14.9 % (ref 11.0–15.0)
Total Lymphocyte: 28.2 %
WBC mixed population: 456 cells/uL (ref 200–950)
WBC: 4.9 10*3/uL (ref 3.8–10.8)

## 2017-04-26 LAB — HEMOGLOBIN A1C
Hgb A1c MFr Bld: 8.7 % of total Hgb — ABNORMAL HIGH (ref <5.7)
Mean Plasma Glucose: 203 (calc)
eAG (mmol/L): 11.2 (calc)

## 2017-04-28 ENCOUNTER — Encounter: Payer: Self-pay | Admitting: *Deleted

## 2017-05-05 ENCOUNTER — Ambulatory Visit: Payer: Medicare Other | Admitting: Internal Medicine

## 2017-05-08 ENCOUNTER — Other Ambulatory Visit: Payer: Self-pay | Admitting: Cardiology

## 2017-05-08 DIAGNOSIS — I872 Venous insufficiency (chronic) (peripheral): Secondary | ICD-10-CM

## 2017-05-08 DIAGNOSIS — S61411A Laceration without foreign body of right hand, initial encounter: Secondary | ICD-10-CM | POA: Diagnosis not present

## 2017-05-16 DIAGNOSIS — R0681 Apnea, not elsewhere classified: Secondary | ICD-10-CM | POA: Diagnosis not present

## 2017-05-16 DIAGNOSIS — E1140 Type 2 diabetes mellitus with diabetic neuropathy, unspecified: Secondary | ICD-10-CM | POA: Diagnosis not present

## 2017-05-16 DIAGNOSIS — Z79899 Other long term (current) drug therapy: Secondary | ICD-10-CM | POA: Diagnosis not present

## 2017-05-16 DIAGNOSIS — R5383 Other fatigue: Secondary | ICD-10-CM | POA: Diagnosis not present

## 2017-05-16 DIAGNOSIS — R0683 Snoring: Secondary | ICD-10-CM | POA: Diagnosis not present

## 2017-05-16 DIAGNOSIS — E119 Type 2 diabetes mellitus without complications: Secondary | ICD-10-CM | POA: Diagnosis not present

## 2017-05-16 DIAGNOSIS — G4719 Other hypersomnia: Secondary | ICD-10-CM | POA: Diagnosis not present

## 2017-05-16 DIAGNOSIS — R200 Anesthesia of skin: Secondary | ICD-10-CM | POA: Diagnosis not present

## 2017-05-16 DIAGNOSIS — G2581 Restless legs syndrome: Secondary | ICD-10-CM | POA: Diagnosis not present

## 2017-05-16 DIAGNOSIS — Z955 Presence of coronary angioplasty implant and graft: Secondary | ICD-10-CM | POA: Diagnosis not present

## 2017-05-16 DIAGNOSIS — Z794 Long term (current) use of insulin: Secondary | ICD-10-CM | POA: Diagnosis not present

## 2017-05-16 DIAGNOSIS — I2510 Atherosclerotic heart disease of native coronary artery without angina pectoris: Secondary | ICD-10-CM | POA: Diagnosis not present

## 2017-05-16 DIAGNOSIS — R413 Other amnesia: Secondary | ICD-10-CM | POA: Diagnosis not present

## 2017-05-16 DIAGNOSIS — G4710 Hypersomnia, unspecified: Secondary | ICD-10-CM | POA: Diagnosis not present

## 2017-05-16 DIAGNOSIS — R4781 Slurred speech: Secondary | ICD-10-CM | POA: Diagnosis not present

## 2017-05-16 DIAGNOSIS — D508 Other iron deficiency anemias: Secondary | ICD-10-CM | POA: Diagnosis not present

## 2017-05-17 DIAGNOSIS — K521 Toxic gastroenteritis and colitis: Secondary | ICD-10-CM | POA: Diagnosis not present

## 2017-05-22 ENCOUNTER — Ambulatory Visit (INDEPENDENT_AMBULATORY_CARE_PROVIDER_SITE_OTHER): Payer: Medicare Other | Admitting: Internal Medicine

## 2017-05-22 ENCOUNTER — Encounter: Payer: Self-pay | Admitting: Internal Medicine

## 2017-05-22 VITALS — BP 130/78 | HR 65 | Temp 97.6°F | Resp 10 | Ht 68.0 in | Wt 183.0 lb

## 2017-05-22 DIAGNOSIS — I25118 Atherosclerotic heart disease of native coronary artery with other forms of angina pectoris: Secondary | ICD-10-CM | POA: Diagnosis not present

## 2017-05-22 DIAGNOSIS — G2581 Restless legs syndrome: Secondary | ICD-10-CM | POA: Diagnosis not present

## 2017-05-22 DIAGNOSIS — IMO0002 Reserved for concepts with insufficient information to code with codable children: Secondary | ICD-10-CM

## 2017-05-22 DIAGNOSIS — E785 Hyperlipidemia, unspecified: Secondary | ICD-10-CM | POA: Diagnosis not present

## 2017-05-22 DIAGNOSIS — I8393 Asymptomatic varicose veins of bilateral lower extremities: Secondary | ICD-10-CM

## 2017-05-22 DIAGNOSIS — R251 Tremor, unspecified: Secondary | ICD-10-CM | POA: Diagnosis not present

## 2017-05-22 DIAGNOSIS — I10 Essential (primary) hypertension: Secondary | ICD-10-CM | POA: Diagnosis not present

## 2017-05-22 DIAGNOSIS — E1165 Type 2 diabetes mellitus with hyperglycemia: Secondary | ICD-10-CM

## 2017-05-22 DIAGNOSIS — Z794 Long term (current) use of insulin: Secondary | ICD-10-CM | POA: Diagnosis not present

## 2017-05-22 DIAGNOSIS — D509 Iron deficiency anemia, unspecified: Secondary | ICD-10-CM | POA: Diagnosis not present

## 2017-05-22 DIAGNOSIS — R0602 Shortness of breath: Secondary | ICD-10-CM

## 2017-05-22 DIAGNOSIS — E1159 Type 2 diabetes mellitus with other circulatory complications: Secondary | ICD-10-CM

## 2017-05-22 MED ORDER — NITROGLYCERIN 0.4 MG SL SUBL: 0.4000 mg | SUBLINGUAL_TABLET | SUBLINGUAL | 0 refills | Status: DC | PRN

## 2017-05-22 MED ORDER — PRAMIPEXOLE DIHYDROCHLORIDE 1 MG PO TABS: ORAL_TABLET | ORAL | 0 refills | Status: DC

## 2017-05-22 NOTE — Progress Notes (Signed)
Location:  [facility] clinic Provider:  Tiffany L. Renato Gails, D.O., C.M.D.  Code Status: DNR Goals of Care:  Advanced Directives 05/22/2017  Does Patient Have a Medical Advance Directive? No  Does patient want to make changes to medical advance directive? -  Would patient like information on creating a medical advance directive? Yes (MAU/Ambulatory/Procedural Areas - Information given)  Pre-existing out of facility DNR order (yellow form or pink MOST form) -     Chief Complaint  Patient presents with  . Medical Management of Chronic Issues    4 month follow-up and foot exam due  . Health Maintenance    Patient will set up eye exam with Dr.Oman, refused Hep C screening, and refused all vaccine updates   . Medication Refill    Nitroglycerin  . Medication Management    Patient changed insulin on his own to 30 units vs 40, patient states 40 units causes him to bottom out   . Advance Care Planning    Patient does not have a HPOA/alreadys has paperwork     HPI: Patient is a 67 y.o. male seen today for medical management of chronic diseases. Overall doing well with some concerns today in the office.  He reports having issues with his basal insulin. He has been adjusting his dose at night from 40 units to either 20-30 units at night as he was having a hard time controlling his blood sugar and was dropping. Last A1c was drawn in Feb 2019- 8.7%.  He reports having memory issues, keeps forgetting things and cant find certain words in conversation. He spoke with neurologist about this and they performed an MRI which showed vascular changes. He has been forgetting to take his Repatha (he gets samples from this Cardiologist), he just reports forgetting it. He thinks it is stress related. He is retired but is now working three other jobs.   He reports Left great toe starting to have arthritis. He denies gout symptoms. Denies pain with touching. Denies heat/warmth. Denies trouble with walking most of the  time. He associates the pain with poor mechanics and shoes.   Tremors still occurring throughout the day. It depends in activity level he reports. He is weaning down on mirapex. He was started on 400mg  gabapentin for restless leg from the neurologist. He reports the gabapentin is doing well and he is sleeping better. Getting 4-5 hours of sleep.   He reports having shortness of breath when he does not take his iron. He reports not taking is as needed because of the constipation it causes.     Past Medical History:  Diagnosis Date  . Barrett's esophagus    "we've been told that it's all gone; still take RX for GERD" (05/03/2014)  . CAD (coronary artery disease), native coronary artery    Coronary angiogram 05/03/2014:  Proximal LAD 3.0x12 mm Promus premier stent.  04/08/2014: Mid Cx 3.5 x 16 mm Promus DES.  03/29/2013: Mid LAD 2.75 x 38 mm Promus Premier drug-eluting stent, balloon angioplasty of D1 and distal LAD and stenting of distal RCA with 2.75 x 24 mm promos Premier drug-eluting stent 12/15/2012 widely patent.  . Cervicalgia   . Chronic bronchitis (HCC)    "sometimes 2-3 times/yr" (05/03/2014)  . Complication of anesthesia    " I do not wake up very well "  . Family history of adverse reaction to anesthesia    "son w/PONV"  . GERD (gastroesophageal reflux disease)   . H/O hiatal hernia   .  Headache    "weekly" (05/03/2014)  . Heart murmur    "grew out of it"   . Hyperlipidemia   . Hypoglycemia, unspecified   . IDDM (insulin dependent diabetes mellitus) (HCC)   . Impotence of organic origin   . Other malaise and fatigue   . PONV (postoperative nausea and vomiting)   . Restless leg   . Tension headache   . Unspecified gastritis and gastroduodenitis with hemorrhage   . Unstable angina pectoris (HCC) 04/08/2014   Coronary angiogram 05/03/2014:  Proximal LAD 3.0x12 mm Promus premier stent.  04/08/2014: Mid Cx 3.5 x 16 mm Promus DES.  03/29/2013: Mid LAD 2.75 x 38 mm Promus Premier drug-eluting  stent, balloon angioplasty of D1 and distal LAD and stenting of distal RCA with 2.75 x 24 mm promos Premier drug-eluting stent 12/15/2012 widely patent.    Past Surgical History:  Procedure Laterality Date  . CARDIAC CATHETERIZATION N/A 09/25/2015   Procedure: Left Heart Cath and Coronary Angiography;  Surgeon: Yates DecampJay Ganji, MD;  [facility];  Service: Cardiovascular;  Laterality: N/A;  . CARDIAC CATHETERIZATION N/A 09/25/2015   Procedure: Intravascular Pressure Wire/FFR Study;  Surgeon: Yates DecampJay Ganji, MD;  [facility];  Service: Cardiovascular;  Laterality: N/A;  . CORONARY ANGIOPLASTY WITH STENT PLACEMENT  12/15/2012; 04/28/2014; 05/03/2014   "2; 1; 1"   . FRACTIONAL FLOW RESERVE WIRE Right 03/26/2013   Procedure: FRACTIONAL FLOW RESERVE WIRE;  Surgeon: Pamella PertJagadeesh R Ganji, MD;  [facility];  Service: Cardiovascular;  Laterality: Right;  . FRACTIONAL FLOW RESERVE WIRE N/A 05/03/2014   Procedure: FRACTIONAL FLOW RESERVE WIRE;  Surgeon: Pamella PertJagadeesh R Ganji, MD;  [facility];  Service: Cardiovascular;  Laterality: N/A;  . HERNIA REPAIR  2010   "umbilical"   . INCISION AND DRAINAGE ABSCESS Left 05/2007   "groin"   . KNEE SURGERY Right 1992;  2000   "calcium deposits removed" (12/15/2012)  . LEFT HEART CATHETERIZATION WITH CORONARY ANGIOGRAM N/A 12/15/2012   Procedure: LEFT HEART CATHETERIZATION WITH CORONARY ANGIOGRAM;  Surgeon: Pamella PertJagadeesh R Ganji, MD;  [facility];  Service: Cardiovascular;  Laterality: N/A;  . LEFT HEART CATHETERIZATION WITH CORONARY ANGIOGRAM N/A 03/26/2013   Procedure: LEFT HEART CATHETERIZATION WITH CORONARY ANGIOGRAM;  Surgeon: Pamella PertJagadeesh R Ganji, MD;  [facility];  Service: Cardiovascular;  Laterality: N/A;  . LEFT HEART CATHETERIZATION WITH CORONARY ANGIOGRAM N/A 04/08/2014   Procedure: LEFT HEART CATHETERIZATION WITH CORONARY ANGIOGRAM;  Surgeon: Micheline ChapmanMichael D Cooper, MD;  [facility];  Service: Cardiovascular;   Laterality: N/A;  . PERCUTANEOUS CORONARY STENT INTERVENTION (PCI-S) N/A 05/03/2014   Procedure: PERCUTANEOUS CORONARY STENT INTERVENTION (PCI-S);  Surgeon: Pamella PertJagadeesh R Ganji, MD;  [facility];  Service: Cardiovascular;  Laterality: N/A;  . UMBILICAL HERNIA REPAIR      Allergies  Allergen Reactions  . Lyrica [Pregabalin] Other (See Comments)    Made patient very lethargic the next morning, very hard to patient to function, move, etc.   . Statins Other (See Comments)    Causes Restless Legs, rhabdomyolysis    Outpatient Encounter Medications as of 05/22/2017  Medication Sig  . acetaminophen (TYLENOL) 500 MG tablet Take 1,000-1,500 mg by mouth every 6 (six) hours as needed for headache.  . Alirocumab (PRALUENT) 75 MG/ML SOPN Inject 1 application into the skin every 14 (fourteen) days.  Marland Kitchen. aspirin 81 MG EC tablet Take 1 tablet (81 mg total) by mouth daily.  . beclomethasone (QVAR) 40 MCG/ACT  inhaler Inhale 1 puff into the lungs 2 (two) times daily.  . carvedilol (COREG) 6.25 MG tablet Take 1/2 tablet by mouth twice daily with meals  . fluticasone (FLONASE) 50 MCG/ACT nasal spray PLACE 1 SPRAY IN EACH NOSTRIL EVERY DAY  . gabapentin (NEURONTIN) 100 MG capsule 4 by mouth at bedtime  . glimepiride (AMARYL) 1 MG tablet TAKE ONE TABLET BY MOUTH ONCE DAILY AS NEEDED TO CONTROL BLOOD SUGAR  . Insulin Glargine (BASAGLAR KWIKPEN) 100 UNIT/ML SOPN Inject 30 Units into the skin at bedtime.  . Insulin Pen Needle (PEN NEEDLES) 32G X 4 MM MISC at bedtime.  . nitroGLYCERIN (NITROSTAT) 0.4 MG SL tablet Place 0.4 mg under the tongue every 5 (five) minutes as needed for chest pain.  . pantoprazole (PROTONIX) 40 MG tablet Take one tablet by mouth twice daily for stomach  . pramipexole (MIRAPEX) 1 MG tablet Take three tablets by mouth at bedtime for restless legs  . tizanidine (ZANAFLEX) 2 MG capsule Take 1 capsule (2 mg total) by mouth 2 (two) times daily as needed for muscle spasms.  . vitamin C  (ASCORBIC ACID) 500 MG tablet Take 500 mg by mouth daily.  . [DISCONTINUED] Insulin Glargine (BASAGLAR KWIKPEN) 100 UNIT/ML SOPN Inject 0.4 mLs (40 Units total) into the skin at bedtime. (Patient taking differently: Inject 40 Units into the skin at bedtime. )  . [DISCONTINUED] iron polysaccharides (NU-IRON) 150 MG capsule Take 1 capsule (150 mg total) by mouth daily. (Patient not taking: Reported on 04/25/2017)   No facility-administered encounter medications on file as of 05/22/2017.     Review of Systems:  Review of Systems  Constitutional: Negative for chills, fever and malaise/fatigue.  HENT: Positive for hearing loss.   Eyes: Negative for blurred vision.  Respiratory: Positive for shortness of breath. Negative for cough.        SOB when off iron supplement  Cardiovascular: Positive for leg swelling. Negative for chest pain and palpitations.  Gastrointestinal: Positive for constipation. Negative for blood in stool and heartburn.  Genitourinary: Negative for frequency, hematuria and urgency.  Musculoskeletal: Positive for joint pain. Negative for falls.  Neurological: Positive for tremors. Negative for dizziness and headaches.       Restless leg Tremor in the evening with movement   Psychiatric/Behavioral: Positive for memory loss. Negative for depression. The patient is not nervous/anxious and does not have insomnia.     Health Maintenance  Topic Date Due  . FOOT EXAM  04/07/2015  . INFLUENZA VACCINE  06/01/2017 (Originally 10/02/2016)  . OPHTHALMOLOGY EXAM  08/02/2017 (Originally 12/20/2016)  . Hepatitis C Screening  05/23/2018 (Originally March 09, 1950)  . PNA vac Low Risk Adult (2 of 2 - PPSV23) 05/23/2018 (Originally 12/02/2014)  . URINE MICROALBUMIN  08/27/2017  . HEMOGLOBIN A1C  10/23/2017  . COLONOSCOPY  10/18/2025  . TETANUS/TDAP  03/01/2027    Physical Exam: Vitals:   05/22/17 0721  BP: 130/78  Pulse: 65  Resp: 10  Temp: 97.6 F (36.4 C)  TempSrc: Oral  SpO2: 97%    Weight: 183 lb (83 kg)  Height: 5\' 8"  (1.727 m)   Body mass index is 27.83 kg/m. Physical Exam  Constitutional: He is oriented to person, place, and time. He appears well-developed and well-nourished.  HENT:  Head: Normocephalic.  Cardiovascular: Normal rate, regular rhythm, normal heart sounds and intact distal pulses.  Pulses:      Dorsalis pedis pulses are 2+ on the right side, and 2+ on the left side.  Posterior tibial pulses are 2+ on the right side, and 2+ on the left side.  Pulmonary/Chest: Effort normal and breath sounds normal.  Abdominal: Soft. Bowel sounds are normal.  Musculoskeletal: Normal range of motion.       Right foot: There is normal range of motion and no deformity.       Left foot: There is normal range of motion and no deformity.  Feet:  Right Foot:  Protective Sensation: 5 sites tested. 5 sites sensed.  Skin Integrity: Positive for warmth and dry skin. Negative for ulcer or blister.  Left Foot:  Protective Sensation: 5 sites tested. 5 sites sensed.  Skin Integrity: Positive for warmth and dry skin. Negative for ulcer or blister.  Neurological: He is alert and oriented to person, place, and time.  Skin: Skin is warm and dry. Capillary refill takes less than 2 seconds.  Psychiatric: He has a normal mood and affect. His behavior is normal. Judgment and thought content normal.  Vitals reviewed.   Labs reviewed: Basic Metabolic Panel: Recent Labs    08/27/16 0849 12/26/16 0817 04/25/17 0920  NA 136 138 139  K 4.9 4.3 4.5  CL 102 104 104  CO2 27 27 26   GLUCOSE 143* 129* 111*  BUN 24 24 18   CREATININE 1.05 1.08 1.08  CALCIUM 9.3 9.3 9.3   Liver Function Tests: Recent Labs    12/26/16 0817  AST 24  ALT 25  BILITOT 0.4  PROT 6.8   No results for input(s): LIPASE, AMYLASE in the last 8760 hours. No results for input(s): AMMONIA in the last 8760 hours. CBC: Recent Labs    08/27/16 0849 12/26/16 0817 04/25/17 0920  WBC 4.4 4.1 4.9   NEUTROABS 2,552 2,485 2,940  HGB 13.6 11.9* 13.8  HCT 41.0 36.2* 41.3  MCV 82.0 78.7* 80.4  PLT 136* 134* 142   Lipid Panel: Recent Labs    08/27/16 0849  CHOL 94  HDL 37*  LDLCALC 36  TRIG 161  CHOLHDL 2.5   Lab Results  Component Value Date   HGBA1C 8.7 (H) 04/25/2017    Procedures since last visit: No results found.  Assessment/Plan 1. Essential hypertension At goal will continue coreg and assess labs in the future.  - CBC with Differential/Platelet; Future - COMPLETE METABOLIC PANEL WITH GFR; Future  2. Tremor This has been persistent and he was seeing a Dr at [facility], it was question as to if there was a unusual neurological issue with his such as parkinsonism or another disorder. He was Dx with RLS with that provider and started on Gabapentin.   3. Restless legs syndrome (RLS) He was reduced on Mirapex and started on Gabapentin for RLS as he has more tremor occurrence at night that interfered with sleep. He reports this is better and he is able to sleep more.   - pramipexole (MIRAPEX) 1 MG tablet; Take two tablets by mouth at bedtime for restless legs  Dispense: 180 tablet; Refill: 0  4. Shortness of breath He reports his happens when he has not taken his iron. He denies having this in the office.   5. Iron deficiency anemia, unspecified iron deficiency anemia type Has stopped taking his iron as prescribed. Will assess this as future date as he does not like iron due to constipation. Encourage to take his iron and drink water and include fiber in his diet to help this common side effect.  - CBC with Differential/Platelet; Future  6. Uncontrolled diabetes mellitus with  circulatory complication, with long-term current use of insulin (HCC) His A1c went up to 8.7%. He reports trouble managing his insulin and has changed his basal rate from 40 units to 20-30 units depending on his blood sugar at night. Because he was having issue managing low blood sugar from  this he reports. Advised him to see Cathey here in the office to help with manage of insulin and nutrition.   - Hemoglobin A1c; Future - Lipid panel; Future  7. Hyperlipidemia, unspecified hyperlipidemia type He keeps forgetting to take his Repatha. He gets samples of this from his cardiologist. He reports not being able to afford it if not. He is allergic to statins.  He does not have a good diet and reports needing help with this. Advised to see Cathey for help on nutrition.  - Lipid panel; Future  8. Coronary artery disease involving native coronary artery of native heart with other form of angina pectoris (HCC) He lost his nitro, will order him a new bottle. He has not used this recently.   - nitroGLYCERIN (NITROSTAT) 0.4 MG SL tablet; Place 1 tablet (0.4 mg total) under the tongue every 5 (five) minutes as needed for chest pain.  Dispense: 50 tablet; Refill: 0 - Lipid panel; Future  9. Varicose veins of both lower extremities, unspecified whether complicated He reports going to have imaging of his veins to check this. He is wearing compression and works to keep legs elevated when dependent.   Labs/tests ordered:  Orders Placed This Encounter  Procedures  . CBC with Differential/Platelet    Standing Status:   Future    Standing Expiration Date:   01/22/2018  . COMPLETE METABOLIC PANEL WITH GFR    Standing Status:   Future    Standing Expiration Date:   01/22/2018  . Hemoglobin A1c    Standing Status:   Future    Standing Expiration Date:   01/22/2018  . Lipid panel    Standing Status:   Future    Standing Expiration Date:   01/22/2018    Next appt:  10/09/2017  Rosine Door, RN, AGPCNP, DNP Student  Geriatric[facility] [phone] [address], [place] [address] Cell Phone (Mon-Fri 8am-5pm):  [phone] On Call:  [phone] & follow prompts after 5pm & weekends Office Phone:  [phone] Office Fax:  [phone]

## 2017-05-22 NOTE — Patient Instructions (Addendum)
Recommend seeing Cathey in the office for help with insulin and nutrition goals  Write yourself notes or set alarms to help remind you of your medications.

## 2017-05-23 ENCOUNTER — Observation Stay (HOSPITAL_COMMUNITY)
Admission: EM | Admit: 2017-05-23 | Discharge: 2017-05-23 | Disposition: A | Discharge status: Home / Self Care | Payer: Medicare Other | Attending: Cardiology | Admitting: Cardiology

## 2017-05-23 ENCOUNTER — Encounter (HOSPITAL_COMMUNITY): Payer: Self-pay | Admitting: Emergency Medicine

## 2017-05-23 ENCOUNTER — Other Ambulatory Visit: Payer: Self-pay | Admitting: Cardiology

## 2017-05-23 ENCOUNTER — Ambulatory Visit (HOSPITAL_COMMUNITY): Admit: 2017-05-23 | Payer: Medicare Other | Admitting: Cardiology

## 2017-05-23 ENCOUNTER — Ambulatory Visit (HOSPITAL_COMMUNITY)
Admission: EM | Disposition: A | Discharge status: Home / Self Care | Payer: Self-pay | Source: Home / Self Care | Attending: Emergency Medicine

## 2017-05-23 ENCOUNTER — Other Ambulatory Visit: Payer: Self-pay

## 2017-05-23 DIAGNOSIS — Z8719 Personal history of other diseases of the digestive system: Secondary | ICD-10-CM | POA: Diagnosis not present

## 2017-05-23 DIAGNOSIS — I10 Essential (primary) hypertension: Secondary | ICD-10-CM | POA: Diagnosis not present

## 2017-05-23 DIAGNOSIS — E785 Hyperlipidemia, unspecified: Secondary | ICD-10-CM | POA: Diagnosis not present

## 2017-05-23 DIAGNOSIS — Z955 Presence of coronary angioplasty implant and graft: Secondary | ICD-10-CM | POA: Insufficient documentation

## 2017-05-23 DIAGNOSIS — E1142 Type 2 diabetes mellitus with diabetic polyneuropathy: Secondary | ICD-10-CM | POA: Diagnosis not present

## 2017-05-23 DIAGNOSIS — G2581 Restless legs syndrome: Secondary | ICD-10-CM | POA: Insufficient documentation

## 2017-05-23 DIAGNOSIS — J42 Unspecified chronic bronchitis: Secondary | ICD-10-CM | POA: Diagnosis not present

## 2017-05-23 DIAGNOSIS — I200 Unstable angina: Secondary | ICD-10-CM | POA: Diagnosis present

## 2017-05-23 DIAGNOSIS — Z794 Long term (current) use of insulin: Secondary | ICD-10-CM | POA: Diagnosis not present

## 2017-05-23 DIAGNOSIS — I25110 Atherosclerotic heart disease of native coronary artery with unstable angina pectoris: Principal | ICD-10-CM | POA: Insufficient documentation

## 2017-05-23 DIAGNOSIS — E1165 Type 2 diabetes mellitus with hyperglycemia: Secondary | ICD-10-CM | POA: Insufficient documentation

## 2017-05-23 DIAGNOSIS — K219 Gastro-esophageal reflux disease without esophagitis: Secondary | ICD-10-CM | POA: Insufficient documentation

## 2017-05-23 DIAGNOSIS — R079 Chest pain, unspecified: Secondary | ICD-10-CM | POA: Diagnosis not present

## 2017-05-23 DIAGNOSIS — R0789 Other chest pain: Secondary | ICD-10-CM | POA: Diagnosis not present

## 2017-05-23 DIAGNOSIS — R0602 Shortness of breath: Secondary | ICD-10-CM | POA: Diagnosis not present

## 2017-05-23 HISTORY — PX: LEFT HEART CATH AND CORONARY ANGIOGRAPHY: CATH118249

## 2017-05-23 HISTORY — DX: Iron deficiency anemia, unspecified: D50.9

## 2017-05-23 HISTORY — DX: Unspecified osteoarthritis, unspecified site: M19.90

## 2017-05-23 LAB — COMPREHENSIVE METABOLIC PANEL
ALT: 25 U/L (ref 17–63)
ANION GAP: 11 (ref 5–15)
AST: 23 U/L (ref 15–41)
Albumin: 3.9 g/dL (ref 3.5–5.0)
Alkaline Phosphatase: 53 U/L (ref 38–126)
BUN: 13 mg/dL (ref 6–20)
CHLORIDE: 100 mmol/L — AB (ref 101–111)
CO2: 22 mmol/L (ref 22–32)
Calcium: 8.8 mg/dL — ABNORMAL LOW (ref 8.9–10.3)
Creatinine, Ser: 1.09 mg/dL (ref 0.61–1.24)
GFR calc non Af Amer: >60 mL/min (ref >60)
Glucose, Bld: 245 mg/dL — ABNORMAL HIGH (ref 65–99)
POTASSIUM: 4.2 mmol/L (ref 3.5–5.1)
SODIUM: 133 mmol/L — AB (ref 135–145)
Total Bilirubin: 0.6 mg/dL (ref 0.3–1.2)
Total Protein: 6.8 g/dL (ref 6.5–8.1)

## 2017-05-23 LAB — CBC
HCT: 40.3 % (ref 39.0–52.0)
Hemoglobin: 14 g/dL (ref 13.0–17.0)
MCH: 28.8 pg (ref 26.0–34.0)
MCHC: 34.7 g/dL (ref 30.0–36.0)
MCV: 82.9 fL (ref 78.0–100.0)
Platelets: 121 10*3/uL — ABNORMAL LOW (ref 150–400)
RBC: 4.86 MIL/uL (ref 4.22–5.81)
RDW: 14.3 % (ref 11.5–15.5)
WBC: 5 10*3/uL (ref 4.0–10.5)

## 2017-05-23 LAB — POCT I-STAT, CHEM 8
BUN: 16 mg/dL (ref 6–20)
CALCIUM ION: 1.21 mmol/L (ref 1.15–1.40)
CHLORIDE: 99 mmol/L — AB (ref 101–111)
CREATININE: 0.9 mg/dL (ref 0.61–1.24)
GLUCOSE: 235 mg/dL — AB (ref 65–99)
HCT: 39 % (ref 39.0–52.0)
Hemoglobin: 13.3 g/dL (ref 13.0–17.0)
POTASSIUM: 4.1 mmol/L (ref 3.5–5.1)
Sodium: 138 mmol/L (ref 135–145)
TCO2: 27 mmol/L (ref 22–32)

## 2017-05-23 LAB — GLUCOSE, CAPILLARY
GLUCOSE-CAPILLARY: 198 mg/dL — AB (ref 65–99)
Glucose-Capillary: 123 mg/dL — ABNORMAL HIGH (ref 65–99)

## 2017-05-23 LAB — TROPONIN I: Troponin I: <0.03 ng/mL (ref <0.03)

## 2017-05-23 LAB — PROTIME-INR
INR: 1.1
Prothrombin Time: 14.1 seconds (ref 11.4–15.2)

## 2017-05-23 LAB — CBG MONITORING, ED: Glucose-Capillary: 233 mg/dL — ABNORMAL HIGH (ref 65–99)

## 2017-05-23 LAB — MRSA PCR SCREENING: MRSA BY PCR: NEGATIVE

## 2017-05-23 SURGERY — LEFT HEART CATH AND CORONARY ANGIOGRAPHY
Anesthesia: LOCAL

## 2017-05-23 MED ORDER — HEPARIN (PORCINE) IN NACL 2-0.9 UNIT/ML-% IJ SOLN
INTRAMUSCULAR | Status: AC | PRN
  Administered 2017-05-23 (×2): 500 mL

## 2017-05-23 MED ORDER — SODIUM CHLORIDE 0.9 % IV SOLN: 250.0000 mL | INTRAVENOUS | Status: DC | PRN

## 2017-05-23 MED ORDER — SODIUM CHLORIDE 0.9% FLUSH: 3.0000 mL | Freq: Two times a day (BID) | INTRAVENOUS | Status: DC

## 2017-05-23 MED ORDER — VERAPAMIL HCL 2.5 MG/ML IV SOLN
INTRAVENOUS | Status: AC
  Filled 2017-05-23: qty 2

## 2017-05-23 MED ORDER — LIDOCAINE HCL (PF) 1 % IJ SOLN
INTRAMUSCULAR | Status: DC | PRN
  Administered 2017-05-23: 2 mL

## 2017-05-23 MED ORDER — VERAPAMIL HCL 2.5 MG/ML IV SOLN
INTRA_ARTERIAL | Status: DC | PRN
  Administered 2017-05-23: 5 mL via INTRA_ARTERIAL

## 2017-05-23 MED ORDER — HEPARIN (PORCINE) IN NACL 2-0.9 UNIT/ML-% IJ SOLN
INTRAMUSCULAR | Status: AC | PRN
  Administered 2017-05-23: 500 mL

## 2017-05-23 MED ORDER — SODIUM CHLORIDE 0.9 % WEIGHT BASED INFUSION: 1.0000 mL/kg/h | INTRAVENOUS | Status: AC

## 2017-05-23 MED ORDER — AMLODIPINE BESYLATE 5 MG PO TABS: 5.0000 mg | ORAL_TABLET | Freq: Every day | ORAL | 1 refills | Status: DC

## 2017-05-23 MED ORDER — BASAGLAR KWIKPEN 100 UNIT/ML ~~LOC~~ SOPN: 30.0000 [IU] | PEN_INJECTOR | Freq: Every day | SUBCUTANEOUS | Status: DC

## 2017-05-23 MED ORDER — HEPARIN SODIUM (PORCINE) 1000 UNIT/ML IJ SOLN
INTRAMUSCULAR | Status: AC
  Filled 2017-05-23: qty 1

## 2017-05-23 MED ORDER — HEPARIN SODIUM (PORCINE) 1000 UNIT/ML IJ SOLN
INTRAMUSCULAR | Status: DC | PRN
  Administered 2017-05-23: 6000 [IU] via INTRAVENOUS

## 2017-05-23 MED ORDER — MIDAZOLAM HCL 2 MG/2ML IJ SOLN
INTRAMUSCULAR | Status: AC
  Filled 2017-05-23: qty 2

## 2017-05-23 MED ORDER — ASPIRIN EC 81 MG PO TBEC: 81.0000 mg | DELAYED_RELEASE_TABLET | Freq: Every day | ORAL | Status: DC

## 2017-05-23 MED ORDER — IOPAMIDOL (ISOVUE-370) INJECTION 76%
INTRAVENOUS | Status: DC | PRN
  Administered 2017-05-23: 40 mL via INTRA_ARTERIAL

## 2017-05-23 MED ORDER — VERAPAMIL HCL 2.5 MG/ML IV SOLN: INTRAVENOUS | Status: DC | PRN

## 2017-05-23 MED ORDER — ONDANSETRON HCL 4 MG/2ML IJ SOLN: 4.0000 mg | Freq: Four times a day (QID) | INTRAMUSCULAR | Status: DC | PRN

## 2017-05-23 MED ORDER — NITROGLYCERIN 1 MG/10 ML FOR IR/CATH LAB
INTRA_ARTERIAL | Status: AC
  Filled 2017-05-23: qty 10

## 2017-05-23 MED ORDER — AMLODIPINE BESYLATE 5 MG PO TABS
ORAL_TABLET | ORAL | Status: AC
  Filled 2017-05-23: qty 1

## 2017-05-23 MED ORDER — HEPARIN (PORCINE) IN NACL 2-0.9 UNIT/ML-% IJ SOLN
INTRAMUSCULAR | Status: AC
  Filled 2017-05-23: qty 1000

## 2017-05-23 MED ORDER — AMLODIPINE BESYLATE 5 MG PO TABS
5.0000 mg | ORAL_TABLET | Freq: Every day | ORAL | Status: DC
  Administered 2017-05-23: 5 mg via ORAL

## 2017-05-23 MED ORDER — NITROGLYCERIN 1 MG/10 ML FOR IR/CATH LAB
INTRA_ARTERIAL | Status: DC | PRN
  Administered 2017-05-23 (×2): 200 ug via INTRACORONARY

## 2017-05-23 MED ORDER — CARVEDILOL 12.5 MG PO TABS: 6.2500 mg | ORAL_TABLET | Freq: Two times a day (BID) | ORAL | Status: DC

## 2017-05-23 MED ORDER — ACETAMINOPHEN 325 MG PO TABS: 650.0000 mg | ORAL_TABLET | ORAL | Status: DC | PRN

## 2017-05-23 MED ORDER — IOPAMIDOL (ISOVUE-370) INJECTION 76%
INTRAVENOUS | Status: AC
  Filled 2017-05-23: qty 125

## 2017-05-23 MED ORDER — LIDOCAINE HCL 1 % IJ SOLN
INTRAMUSCULAR | Status: AC
  Filled 2017-05-23: qty 20

## 2017-05-23 MED ORDER — SODIUM CHLORIDE 0.9% FLUSH: 3.0000 mL | INTRAVENOUS | Status: DC | PRN

## 2017-05-23 MED ORDER — MIDAZOLAM HCL 2 MG/2ML IJ SOLN
INTRAMUSCULAR | Status: DC | PRN
  Administered 2017-05-23: 2 mg via INTRAVENOUS

## 2017-05-23 MED ORDER — NITROGLYCERIN 0.4 MG SL SUBL: 0.4000 mg | SUBLINGUAL_TABLET | SUBLINGUAL | Status: DC | PRN

## 2017-05-23 MED ORDER — PRAMIPEXOLE DIHYDROCHLORIDE 1 MG PO TABS: 1.0000 mg | ORAL_TABLET | Freq: Three times a day (TID) | ORAL | Status: DC

## 2017-05-23 MED ORDER — FENTANYL CITRATE (PF) 100 MCG/2ML IJ SOLN
INTRAMUSCULAR | Status: AC
  Filled 2017-05-23: qty 2

## 2017-05-23 MED ORDER — PANTOPRAZOLE SODIUM 40 MG PO TBEC: 40.0000 mg | DELAYED_RELEASE_TABLET | Freq: Every day | ORAL | Status: DC

## 2017-05-23 MED ORDER — BECLOMETHASONE DIPROPIONATE 40 MCG/ACT IN AERS: 1.0000 | INHALATION_SPRAY | Freq: Two times a day (BID) | RESPIRATORY_TRACT | Status: DC

## 2017-05-23 MED ORDER — FENTANYL CITRATE (PF) 100 MCG/2ML IJ SOLN
INTRAMUSCULAR | Status: DC | PRN
  Administered 2017-05-23: 25 ug via INTRAVENOUS

## 2017-05-23 MED ORDER — ALPRAZOLAM 0.5 MG PO TABS: 1.0000 mg | ORAL_TABLET | Freq: Two times a day (BID) | ORAL | Status: DC | PRN

## 2017-05-23 MED ORDER — SODIUM CHLORIDE 0.9 % IV SOLN
INTRAVENOUS | Status: AC | PRN
  Administered 2017-05-23: 10 mL/h via INTRAVENOUS

## 2017-05-23 MED ORDER — GABAPENTIN 300 MG PO CAPS: 300.0000 mg | ORAL_CAPSULE | Freq: Three times a day (TID) | ORAL | Status: DC

## 2017-05-23 SURGICAL SUPPLY — 9 items
BAND ZEPHYR COMPRESS 30 LONG (HEMOSTASIS) ×2 IMPLANT
CATH OPTITORQUE TIG 4.0 5F (CATHETERS) ×2 IMPLANT
GUIDEWIRE INQWIRE 1.5J.035X260 (WIRE) ×1 IMPLANT
INQWIRE 1.5J .035X260CM (WIRE) ×2
KIT HEART LEFT (KITS) ×2 IMPLANT
PACK CARDIAC CATHETERIZATION (CUSTOM PROCEDURE TRAY) ×2 IMPLANT
SHEATH RAIN RADIAL 21G 6FR (SHEATH) ×2 IMPLANT
TRANSDUCER W/STOPCOCK (MISCELLANEOUS) ×2 IMPLANT
TUBING CIL FLEX 10 FLL-RA (TUBING) ×2 IMPLANT

## 2017-05-23 NOTE — ED Notes (Signed)
Wallet, $423 cash, and cell phone locked up with security.

## 2017-05-23 NOTE — ED Triage Notes (Signed)
[facility] for chest pain. Received 324mg  aspirin and 3x SL nitro PTA. Chest pressure currently 4/10. Alert and oriented and in no apparent distress at this time.

## 2017-05-23 NOTE — Progress Notes (Signed)
Kenneth Begay Sr. is an 67 y.o. male.   Chief Complaint: Chest pain HPI: Kenneth Shankman Sr.  is a 67 y.o. male  With He has known CAD and hyperlipidemia, uncontrolled DM and restless leg and statin intolerance, doing well until this morning at 7:30 am suddenly developed severe chest pain with radiation towards upper chest and arms and partially relieved with NTG. He presented to our office and EMS activated due to unrelenting chest pain. He received 3 S.L NTG on route with decrease in CP from 10/10 top 4/10. No dyspnea, nausea, palpitations.  Denies any recent illnesses, had been doing well until this morning.  Past Medical History:  Diagnosis Date  . Barrett's esophagus    "we've been told that it's all gone; still take RX for GERD" (05/03/2014)  . CAD (coronary artery disease), native coronary artery    Coronary angiogram 05/03/2014:  Proximal LAD 3.0x12 mm Promus premier stent.  04/08/2014: Mid Cx 3.5 x 16 mm Promus DES.  03/29/2013: Mid LAD 2.75 x 38 mm Promus Premier drug-eluting stent, balloon angioplasty of D1 and distal LAD and stenting of distal RCA with 2.75 x 24 mm promos Premier drug-eluting stent 12/15/2012 widely patent.  . Cervicalgia   . Chronic bronchitis (HCC)    "sometimes 2-3 times/yr" (05/03/2014)  . Complication of anesthesia    " I do not wake up very well "  . Family history of adverse reaction to anesthesia    "son w/PONV"  . GERD (gastroesophageal reflux disease)   . H/O hiatal hernia   . Headache    "weekly" (05/03/2014)  . Heart murmur    "grew out of it"   . Hyperlipidemia   . Hypoglycemia, unspecified   . IDDM (insulin dependent diabetes mellitus) (HCC)   . Impotence of organic origin   . Other malaise and fatigue   . PONV (postoperative nausea and vomiting)   . Restless leg   . Tension headache   . Unspecified gastritis and gastroduodenitis with hemorrhage   . Unstable angina pectoris (HCC) 04/08/2014   Coronary angiogram 05/03/2014:  Proximal LAD 3.0x12 mm Promus premier  stent.  04/08/2014: Mid Cx 3.5 x 16 mm Promus DES.  03/29/2013: Mid LAD 2.75 x 38 mm Promus Premier drug-eluting stent, balloon angioplasty of D1 and distal LAD and stenting of distal RCA with 2.75 x 24 mm promos Premier drug-eluting stent 12/15/2012 widely patent.    Past Surgical History:  Procedure Laterality Date  . CARDIAC CATHETERIZATION N/A 09/25/2015   Procedure: Left Heart Cath and Coronary Angiography;  Surgeon: [name], MD;  Location: [facility] INVASIVE CV LAB;  Service: Cardiovascular;  Laterality: N/A;  . CARDIAC CATHETERIZATION N/A 09/25/2015   Procedure: Intravascular Pressure Wire/FFR Study;  Surgeon: [name], MD;  Location: [facility] INVASIVE CV LAB;  Service: Cardiovascular;  Laterality: N/A;  . CORONARY ANGIOPLASTY WITH STENT PLACEMENT  12/15/2012; 04/28/2014; 05/03/2014   "2; 1; 1"   . FRACTIONAL FLOW RESERVE WIRE Right 03/26/2013   Procedure: FRACTIONAL FLOW RESERVE WIRE;  Surgeon: Pamella Pert, MD;  Location: [facility] CATH LAB;  Service: Cardiovascular;  Laterality: Right;  . FRACTIONAL FLOW RESERVE WIRE N/A 05/03/2014   Procedure: FRACTIONAL FLOW RESERVE WIRE;  Surgeon: Pamella Pert, MD;  Location: [facility] CATH LAB;  Service: Cardiovascular;  Laterality: N/A;  . HERNIA REPAIR  2010   "umbilical"   . INCISION AND DRAINAGE ABSCESS Left 05/2007   "groin"   . KNEE SURGERY Right 1992;  2000   "calcium deposits removed" (12/15/2012)  .  LEFT HEART CATHETERIZATION WITH CORONARY ANGIOGRAM N/A 12/15/2012   Procedure: LEFT HEART CATHETERIZATION WITH CORONARY ANGIOGRAM;  Surgeon: Pamella PertJagadeesh R Markayla Reichart, MD;  [facility];  Service: Cardiovascular;  Laterality: N/A;  . LEFT HEART CATHETERIZATION WITH CORONARY ANGIOGRAM N/A 03/26/2013   Procedure: LEFT HEART CATHETERIZATION WITH CORONARY ANGIOGRAM;  Surgeon: Pamella PertJagadeesh R Kaitlen Redford, MD;  [facility];  Service: Cardiovascular;  Laterality: N/A;  . LEFT HEART CATHETERIZATION WITH CORONARY ANGIOGRAM N/A 04/08/2014   Procedure: LEFT HEART  CATHETERIZATION WITH CORONARY ANGIOGRAM;  Surgeon: Micheline ChapmanMichael D Cooper, MD;  [facility];  Service: Cardiovascular;  Laterality: N/A;  . PERCUTANEOUS CORONARY STENT INTERVENTION (PCI-S) N/A 05/03/2014   Procedure: PERCUTANEOUS CORONARY STENT INTERVENTION (PCI-S);  Surgeon: Pamella PertJagadeesh R Yamna Mackel, MD;  [facility];  Service: Cardiovascular;  Laterality: N/A;  . UMBILICAL HERNIA REPAIR      Family History  Adopted: Yes  Family history unknown: Yes   Social History:  reports that he has never smoked. He has never used smokeless tobacco. He reports that he does not drink alcohol or use drugs.  Allergies:  Allergies  Allergen Reactions  . Lyrica [Pregabalin] Other (See Comments)    Made patient very lethargic the next morning, very hard to patient to function, move, etc.   . Statins Other (See Comments)    Causes Restless Legs, rhabdomyolysis   Review of Systems  Constitutional: Negative.   HENT: Negative.   Eyes: Negative.   Respiratory: Negative.   Cardiovascular: Positive for chest pain. Negative for palpitations, orthopnea, claudication, leg swelling and PND.  Gastrointestinal: Negative.   Genitourinary: Negative.   Musculoskeletal: Positive for joint pain.  Skin: Negative.   Neurological: Negative.   Endo/Heme/Allergies: Negative.   Psychiatric/Behavioral: Negative.   All other systems reviewed and are negative.  Physical Exam  Constitutional: He is oriented to person, place, and time. He appears well-developed and well-nourished.  HENT:  Head: Atraumatic.  Eyes: Conjunctivae are normal.  Neck: Normal range of motion. Neck supple.  Cardiovascular: Normal rate, regular rhythm, normal heart sounds and intact distal pulses. Exam reveals no gallop and no friction rub.  No murmur heard. Pulmonary/Chest: Effort normal and breath sounds normal.  Abdominal: Soft. Bowel sounds are normal.  Musculoskeletal: Normal range of motion.  Neurological: He is alert and oriented  to person, place, and time.  Skin: Skin is warm and dry.  Psychiatric: He has a normal mood and affect.  Labs:   Lab Results  Component Value Date   WBC 4.9 04/25/2017   HGB 13.8 04/25/2017   HCT 41.3 04/25/2017   MCV 80.4 04/25/2017   PLT 142 04/25/2017   Lipid Panel     Component Value Date/Time   CHOL 94 08/27/2016 0849   CHOL 86 (L) 07/03/2015 0816   TRIG 104 08/27/2016 0849   HDL 37 (L) 08/27/2016 0849   HDL 37 (L) 07/03/2015 0816   CHOLHDL 2.5 08/27/2016 0849   VLDL 21 08/27/2016 0849   LDLCALC 36 08/27/2016 0849   LDLCALC 19 07/03/2015 0816   HEMOGLOBIN A1C Lab Results  Component Value Date   HGBA1C 8.7 (H) 04/25/2017   MPG 203 04/25/2017     Current Outpatient Medications:  .  acetaminophen (TYLENOL) 500 MG tablet, Take 1,000-1,500 mg by mouth every 6 (six) hours as needed for headache., Disp: , Rfl:  .  Alirocumab (PRALUENT) 75 MG/ML SOPN, Inject 1 application into the skin every 14 (fourteen) days., Disp: , Rfl:  .  aspirin 81  MG EC tablet, Take 1 tablet (81 mg total) by mouth daily., Disp: 60 tablet, Rfl: 0 .  beclomethasone (QVAR) 40 MCG/ACT inhaler, Inhale 1 puff into the lungs 2 (two) times daily., Disp: 1 Inhaler, Rfl: 3 .  carvedilol (COREG) 6.25 MG tablet, Take 1/2 tablet by mouth twice daily with meals, Disp: 90 tablet, Rfl: 3 .  fluticasone (FLONASE) 50 MCG/ACT nasal spray, PLACE 1 SPRAY IN EACH NOSTRIL EVERY DAY, Disp: 16 g, Rfl: 1 .  gabapentin (NEURONTIN) 100 MG capsule, 4 by mouth at bedtime, Disp: , Rfl: 6 .  glimepiride (AMARYL) 1 MG tablet, TAKE ONE TABLET BY MOUTH ONCE DAILY AS NEEDED TO CONTROL BLOOD SUGAR, Disp: 30 tablet, Rfl: 5 .  Insulin Glargine (BASAGLAR KWIKPEN) 100 UNIT/ML SOPN, Inject 30 Units into the skin at bedtime., Disp: , Rfl:  .  Insulin Pen Needle (PEN NEEDLES) 32G X 4 MM MISC, at bedtime., Disp: , Rfl:  .  nitroGLYCERIN (NITROSTAT) 0.4 MG SL tablet, Place 1 tablet (0.4 mg total) under the tongue every 5 (five) minutes as  needed for chest pain., Disp: 50 tablet, Rfl: 0 .  pantoprazole (PROTONIX) 40 MG tablet, Take one tablet by mouth twice daily for stomach, Disp: 180 tablet, Rfl: 3 .  pramipexole (MIRAPEX) 1 MG tablet, Take two tablets by mouth at bedtime for restless legs, Disp: 180 tablet, Rfl: 0 .  tizanidine (ZANAFLEX) 2 MG capsule, Take 1 capsule (2 mg total) by mouth 2 (two) times daily as needed for muscle spasms., Disp: 30 capsule, Rfl: 0 .  vitamin C (ASCORBIC ACID) 500 MG tablet, Take 500 mg by mouth daily., Disp: , Rfl:   CARDIAC STUDIES:  EKG 05/23/2017: NSR, no ischemia    Assessment/Plan 1. Unstable angina pectoris 2. CAD of native vessels with unstable angina 3. Hyperlipidemia on Repatha 4. 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 5. Restless leg  Rec: In spite of normal EKG, his symptoms are very typical of [place]. Lipids controlled and DM Is improviing. Will proceed with urgent coronary angiogram and possible angioplasty.   Discussed risks, benefits and alternatives of angiogram including but not limited to <1% risk of death, stroke, MI, need for urgent surgical revascularization, renal failure, but not limited to thest. patient is willing to proceed.   [name], MD 05/23/2017, 9:46 AM [facility] Pager: [phone] Office: [phone] If no answer: Cell:  [phone]

## 2017-05-23 NOTE — ED Provider Notes (Signed)
[facility] Provider Note   CSN: [id number] Arrival date & time: 05/23/17  0945     History   Chief Complaint Chief Complaint  Patient presents with  . Chest Pain    HPI Fortino SicRalph Bielak Sr. is a 67 y.o. male.  HPI  Fortino SicRalph Oser Sr. is a 67 y.o. male with history of coronary artery disease, acid reflux, presents to emergency department complaining of chest pain.  Patient states he ate breakfast this morning, shortly after that started having chest pressure.  Patient states his pain is in the center of the chest, and radiates into bilateral arms.  He reports exertional shortness of breath that started this morning as well.  He states he gets short of breath even taking his shoes off.  He denies any shortness of breath at rest.  No nausea or vomiting.  No dizziness.  He states he was on the way to the hospital but decided to stop by Dr. Verl DickerGanji's office who sent him straight here.  Patient received 324 mg of aspirin of 4 nitroglycerin which brought his pain down to a minimal.   Past Medical History:  Diagnosis Date  . Barrett's esophagus    "we've been told that it's all gone; still take RX for GERD" (05/03/2014)  . CAD (coronary artery disease), native coronary artery    Coronary angiogram 05/03/2014:  Proximal LAD 3.0x12 mm Promus premier stent.  04/08/2014: Mid Cx 3.5 x 16 mm Promus DES.  03/29/2013: Mid LAD 2.75 x 38 mm Promus Premier drug-eluting stent, balloon angioplasty of D1 and distal LAD and stenting of distal RCA with 2.75 x 24 mm promos Premier drug-eluting stent 12/15/2012 widely patent.  . Cervicalgia   . Chronic bronchitis (HCC)    "sometimes 2-3 times/yr" (05/03/2014)  . Complication of anesthesia    " I do not wake up very well "  . Family history of adverse reaction to anesthesia    "son w/PONV"  . GERD (gastroesophageal reflux disease)   . H/O hiatal hernia   . Headache    "weekly" (05/03/2014)  . Heart murmur    "grew out of it"   .  Hyperlipidemia   . Hypoglycemia, unspecified   . IDDM (insulin dependent diabetes mellitus) (HCC)   . Impotence of organic origin   . Other malaise and fatigue   . PONV (postoperative nausea and vomiting)   . Restless leg   . Tension headache   . Unspecified gastritis and gastroduodenitis with hemorrhage   . Unstable angina pectoris (HCC) 04/08/2014   Coronary angiogram 05/03/2014:  Proximal LAD 3.0x12 mm Promus premier stent.  04/08/2014: Mid Cx 3.5 x 16 mm Promus DES.  03/29/2013: Mid LAD 2.75 x 38 mm Promus Premier drug-eluting stent, balloon angioplasty of D1 and distal LAD and stenting of distal RCA with 2.75 x 24 mm promos Premier drug-eluting stent 12/15/2012 widely patent.    Patient Active Problem List   Diagnosis Date Noted  . 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 TSH elevation 07/05/2015  . Plantar fasciitis 04/26/2015  . Tremor 02/14/2015  . Left-sided ischial pain 10/19/2014  . Voice disorder 10/19/2014  . Post PTCA 05/03/2014  . Unstable angina (HCC) 04/08/2014  . Anemia, iron deficiency 09/22/2013  . Elevated CPK 09/21/2013  . Rhabdomyolysis 09/18/2013  . Shortness of breath 08/31/2013  . Achilles tendon pain 06/16/2013  . Pain in joint, shoulder region 03/10/2013  . CAD (coronary artery disease), native coronary artery 12/15/2012  . Chest pain  11/11/2012  . Uncontrolled diabetes mellitus with circulatory complication, with long-term current use of insulin (HCC) 06/16/2012  . Impotence of organic origin 06/16/2012  . Umbilical hernia without mention of obstruction or gangrene 06/16/2012  . Barrett's esophagus 06/16/2012  . Unspecified gastritis and gastroduodenitis with hemorrhage 06/16/2012  . 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 Hyperlipidemia 06/16/2012  . Abdominal pain, epigastric 06/16/2012  . Cholelithiasis 06/16/2012  . Slowing of urinary stream 06/16/2012  . Pain in joint, lower leg 06/16/2012  . Essential hypertension  06/16/2012  . Restless legs syndrome (RLS) 06/16/2012  . Other malaise and fatigue 06/16/2012  . Tension headache 06/16/2012  . Reflux     Past Surgical History:  Procedure Laterality Date  . CARDIAC CATHETERIZATION N/A 09/25/2015   Procedure: Left Heart Cath and Coronary Angiography;  Surgeon: [name], MD;  Location: [facility] INVASIVE CV LAB;  Service: Cardiovascular;  Laterality: N/A;  . CARDIAC CATHETERIZATION N/A 09/25/2015   Procedure: Intravascular Pressure Wire/FFR Study;  Surgeon: [name], MD;  Location: [facility] INVASIVE CV LAB;  Service: Cardiovascular;  Laterality: N/A;  . CORONARY ANGIOPLASTY WITH STENT PLACEMENT  12/15/2012; 04/28/2014; 05/03/2014   "2; 1; 1"   . FRACTIONAL FLOW RESERVE WIRE Right 03/26/2013   Procedure: FRACTIONAL FLOW RESERVE WIRE;  Surgeon: Pamella Pert, MD;  Location: [facility] CATH LAB;  Service: Cardiovascular;  Laterality: Right;  . FRACTIONAL FLOW RESERVE WIRE N/A 05/03/2014   Procedure: FRACTIONAL FLOW RESERVE WIRE;  Surgeon: Pamella Pert, MD;  Location: [facility] CATH LAB;  Service: Cardiovascular;  Laterality: N/A;  . HERNIA REPAIR  2010   "umbilical"   . INCISION AND DRAINAGE ABSCESS Left 05/2007   "groin"   . KNEE SURGERY Right 1992;  2000   "calcium deposits removed" (12/15/2012)  . LEFT HEART CATHETERIZATION WITH CORONARY ANGIOGRAM N/A 12/15/2012   Procedure: LEFT HEART CATHETERIZATION WITH CORONARY ANGIOGRAM;  Surgeon: Pamella Pert, MD;  Location: [facility] CATH LAB;  Service: Cardiovascular;  Laterality: N/A;  . LEFT HEART CATHETERIZATION WITH CORONARY ANGIOGRAM N/A 03/26/2013   Procedure: LEFT HEART CATHETERIZATION WITH CORONARY ANGIOGRAM;  Surgeon: Pamella Pert, MD;  Location: [facility] CATH LAB;  Service: Cardiovascular;  Laterality: N/A;  . LEFT HEART CATHETERIZATION WITH CORONARY ANGIOGRAM N/A 04/08/2014   Procedure: LEFT HEART CATHETERIZATION WITH CORONARY ANGIOGRAM;  Surgeon: Micheline Chapman, MD;  Location: [facility] CATH LAB;  Service: Cardiovascular;  Laterality:  N/A;  . PERCUTANEOUS CORONARY STENT INTERVENTION (PCI-S) N/A 05/03/2014   Procedure: PERCUTANEOUS CORONARY STENT INTERVENTION (PCI-S);  Surgeon: Pamella Pert, MD;  Location: [facility] CATH LAB;  Service: Cardiovascular;  Laterality: N/A;  . UMBILICAL HERNIA REPAIR         Home Medications    Prior to Admission medications   Medication Sig Start Date End Date Taking? Authorizing Provider  acetaminophen (TYLENOL) 500 MG tablet Take 1,000-1,500 mg by mouth every 6 (six) hours as needed for headache.    [provider]  Alirocumab (PRALUENT) 75 MG/ML SOPN Inject 1 application into the skin every 14 (fourteen) days.    [provider]  aspirin 81 MG EC tablet Take 1 tablet (81 mg total) by mouth daily. 12/16/12   [name], MD  beclomethasone (QVAR) 40 MCG/ACT inhaler Inhale 1 puff into the lungs 2 (two) times daily. 12/30/16   Reed, Tiffany L, DO  carvedilol (COREG) 6.25 MG tablet Take 1/2 tablet by mouth twice daily with meals 06/17/16   Kimber Relic, MD  fluticasone Aleda Grana)  50 MCG/ACT nasal spray PLACE 1 SPRAY IN EACH NOSTRIL EVERY DAY 01/30/17   Reed, Tiffany L, DO  gabapentin (NEURONTIN) 100 MG capsule 4 by mouth at bedtime 05/14/17   [provider]  glimepiride (AMARYL) 1 MG tablet TAKE ONE TABLET BY MOUTH ONCE DAILY AS NEEDED TO CONTROL BLOOD SUGAR 12/30/16   Reed, Tiffany L, DO  Insulin Glargine (BASAGLAR KWIKPEN) 100 UNIT/ML SOPN Inject 30 Units into the skin at bedtime.    [provider]  Insulin Pen Needle (PEN NEEDLES) 32G X 4 MM MISC at bedtime.    [provider]  nitroGLYCERIN (NITROSTAT) 0.4 MG SL tablet Place 1 tablet (0.4 mg total) under the tongue every 5 (five) minutes as needed for chest pain. 05/22/17   Reed, Tiffany L, DO  pantoprazole (PROTONIX) 40 MG tablet Take one tablet by mouth twice daily for stomach 06/17/16   Kimber Relic, MD  pramipexole (MIRAPEX) 1 MG tablet Take two tablets by mouth at bedtime for restless legs  05/22/17   Reed, Tiffany L, DO  tizanidine (ZANAFLEX) 2 MG capsule Take 1 capsule (2 mg total) by mouth 2 (two) times daily as needed for muscle spasms. 08/21/16   [name], NP  vitamin C (ASCORBIC ACID) 500 MG tablet Take 500 mg by mouth daily.    [provider]    Family History Family History  Adopted: Yes  Family history unknown: Yes    Social History Social History   Tobacco Use  . Smoking status: Never Smoker  . Smokeless tobacco: Never Used  Substance Use Topics  . Alcohol use: No  . Drug use: No     Allergies   Lyrica [pregabalin] and Statins   Review of Systems Review of Systems  Constitutional: Negative for chills and fever.  Respiratory: Positive for chest tightness and shortness of breath. Negative for cough.   Cardiovascular: Positive for chest pain. Negative for palpitations and leg swelling.  Gastrointestinal: Negative for abdominal distention, abdominal pain, diarrhea, nausea and vomiting.  Musculoskeletal: Negative for arthralgias, myalgias, neck pain and neck stiffness.  Skin: Negative for rash.  Allergic/Immunologic: Negative for immunocompromised state.  Neurological: Negative for dizziness, weakness, light-headedness, numbness and headaches.  All other systems reviewed and are negative.    Physical Exam Updated Vital Signs Pulse 71   Temp 97.6 F (36.4 C) (Oral)   Resp 14   SpO2 98%   Physical Exam  Constitutional: He appears well-developed and well-nourished. No distress.  HENT:  Head: Normocephalic and atraumatic.  Eyes: Conjunctivae are normal.  Neck: Neck supple.  Cardiovascular: Normal rate, regular rhythm and normal heart sounds.  Pulmonary/Chest: Effort normal. No respiratory distress. He has no wheezes. He has no rales.  Abdominal: Soft. Bowel sounds are normal. He exhibits no distension. There is no tenderness. There is no rebound.  Musculoskeletal: He exhibits no edema.  Neurological: He is alert.  Skin: Skin  is warm and dry.  Nursing note and vitals reviewed.    ED Treatments / Results  Labs (all labs ordered are listed, but only abnormal results are displayed) Labs Reviewed  CBC  PROTIME-INR  COMPREHENSIVE METABOLIC PANEL  TROPONIN I  TROPONIN I  TROPONIN I    EKG   Radiology No results found.  Procedures Procedures (including critical care time)  Medications Ordered in ED Medications  aspirin EC tablet 81 mg (has no administration in time range)  nitroGLYCERIN (NITROSTAT) SL tablet 0.4 mg (has no administration in time range)  acetaminophen (  TYLENOL) tablet 650 mg (has no administration in time range)  ondansetron (ZOFRAN) injection 4 mg (has no administration in time range)     Initial Impression / Assessment and Plan / ED Course  I have reviewed the triage vital signs and the nursing notes.  Pertinent labs & imaging results that were available during my care of the patient were reviewed by me and considered in my medical decision making (see chart for details).     Patient in emergency department with chest pain, shortness of breath.  Onset of symptoms this morning.  Dr.Ganji, patient's cardiologist already at bedside, will take to the Cath Lab.  EKG with no acute abnormalities.  Patient's vital signs are normal.  Patient received aspirin and nitroglycerin.  Vitals:   05/23/17 0950  Pulse: 71  Resp: 14  Temp: 97.6 F (36.4 C)  TempSrc: Oral  SpO2: 98%    Final Clinical Impressions(s) / ED Diagnoses   Final diagnoses:  Chest pain, unspecified type    ED Discharge Orders    None       Jaynie Crumble, PA-C 05/25/17 2205    Mesner, Barbara Cower, MD 05/26/17 [phone]

## 2017-05-23 NOTE — Progress Notes (Signed)
TR BAND REMOVAL  LOCATION:    right radial  DEFLATED PER PROTOCOL:    Yes.    TIME BAND OFF / DRESSING APPLIED:    1600   SITE UPON ARRIVAL:    Level 0  SITE AFTER BAND REMOVAL:    Level 0  CIRCULATION SENSATION AND MOVEMENT:    Within Normal Limits   Yes.    COMMENTS:    

## 2017-05-23 NOTE — Discharge Summary (Signed)
Physician Discharge Summary  Patient ID: Kenneth Borak Sr. MRN: [id number] DOB/AGE: 68-May-1952 68 y.o.  Admit date: 05/23/2017 Discharge date: 05/23/2017  Primary Discharge Diagnosis 1. Angina pectoris due to coronary spasm 2. 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 Hyperlipidemia on Repatha 4. 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 5. Restless leg  Significant Diagnostic Studies: Coronary angiogram 05/23/2017: All stents placed previously are patent.  Proximal to mid LAD long stent (Overlaps prior stent) that was placed on 04/08/2014, circumflex/obtuse marginal-1 stenting. History of mid LAD stenting and balloon angioplasty to D1 and distal LAD performed on 03/29/2013, distal RCA stent in 2014. No change in ostial circumflex lesion, the mid LAD stent is widely patent however at the outflow, there is a 30-40% stenosis.  Hospital Course: Admitted directly from office due to severe CP and partially relieved with S/L NTG and due to ongoing CP, taken urgently to the cath lab revealing patent stents and suspected coronary spasm in the outflow of the Mid LAD stent.    Recommendations on discharge: Added Amlodipine. Will f/u as OP.  Discharge Exam: Blood pressure (!) 146/73, pulse 60, temperature 97.6 F (36.4 C), temperature source Oral, resp. rate (!) 23, SpO2 97 %.   General appearance: alert, cooperative, appears stated age and no distress Resp: clear to auscultation bilaterally Chest wall: no tenderness Cardio: regular rate and rhythm, S1, S2 normal, no murmur, click, rub or gallop GI: soft, non-tender; bowel sounds normal; no masses,  no organomegaly Extremities: extremities normal, atraumatic, no cyanosis or edema Pulses: 2+ and symmetric Neurologic: Grossly normal Incision/Wound: Right radial access without complications. Labs:   Lab Results  Component Value Date   WBC 5.0 05/23/2017   HGB 13.3 05/23/2017   HCT 39.0  05/23/2017   MCV 82.9 05/23/2017   PLT 121 (L) 05/23/2017    Recent Labs  Lab 05/23/17 0955 05/23/17 1035  NA 133* 138  K 4.2 4.1  CL 100* 99*  CO2 22  --   BUN 13 16  CREATININE 1.09 0.90  CALCIUM 8.8*  --   PROT 6.8  --   BILITOT 0.6  --   ALKPHOS 53  --   ALT 25  --   AST 23  --   GLUCOSE 245* 235*    Lipid Panel     Component Value Date/Time   CHOL 94 08/27/2016 0849   CHOL 86 (L) 07/03/2015 0816   TRIG 104 08/27/2016 0849   HDL 37 (L) 08/27/2016 0849   HDL 37 (L) 07/03/2015 0816   CHOLHDL 2.5 08/27/2016 0849   VLDL 21 08/27/2016 0849   LDLCALC 36 08/27/2016 0849   LDLCALC 19 07/03/2015 0816   HEMOGLOBIN A1C Lab Results  Component Value Date   HGBA1C 8.7 (H) 04/25/2017   MPG 203 04/25/2017    Cardiac Panel (last 3 results) Recent Labs    05/23/17 0955  TROPONINI <0.03    Lab Results  Component Value Date   CKTOTAL 154 04/29/2016   TROPONINI <0.03 05/23/2017     TSH No results for input(s): TSH in the last 8760 hours.  EKG: normal EKG, normal sinus rhythm, unchanged from previous tracings.  FOLLOW UP PLANS AND APPOINTMENTS  Allergies as of 05/23/2017      Reactions   Lyrica [pregabalin] Other (See Comments)   Made patient very lethargic the next morning, very hard to patient to function, move, etc.    Statins Other (See Comments)   Causes Restless Legs,  rhabdomyolysis      Medication List    STOP taking these medications   tizanidine 2 MG capsule Commonly known as:  ZANAFLEX     TAKE these medications   acetaminophen 500 MG tablet Commonly known as:  TYLENOL Take 1,000 mg by mouth every 6 (six) hours as needed for headache.   albuterol 108 (90 Base) MCG/ACT inhaler Commonly known as:  PROVENTIL HFA;VENTOLIN HFA Inhale 2 puffs into the lungs every 6 (six) hours as needed for wheezing or shortness of breath.   amLODipine 5 MG tablet Commonly known as:  NORVASC Take 1 tablet (5 mg total) by mouth daily. Start taking on:   05/24/2017   aspirin 81 MG EC tablet Take 1 tablet (81 mg total) by mouth daily.   BASAGLAR KWIKPEN 100 UNIT/ML Sopn Inject 30 Units into the skin at bedtime.   carvedilol 6.25 MG tablet Commonly known as:  COREG Take 1/2 tablet by mouth twice daily with meals   ferrous sulfate 325 (65 FE) MG tablet Take 325 mg by mouth daily with breakfast.   fluticasone 50 MCG/ACT nasal spray Commonly known as:  FLONASE PLACE 1 SPRAY IN EACH NOSTRIL EVERY DAY What changed:  See the new instructions.   gabapentin 100 MG capsule Commonly known as:  NEURONTIN Take 400 mg by mouth at bedtime. 4 by mouth at bedtime   glimepiride 1 MG tablet Commonly known as:  AMARYL TAKE ONE TABLET BY MOUTH ONCE DAILY AS NEEDED TO CONTROL BLOOD SUGAR What changed:  See the new instructions.   nitroGLYCERIN 0.4 MG SL tablet Commonly known as:  NITROSTAT Place 1 tablet (0.4 mg total) under the tongue every 5 (five) minutes as needed for chest pain.   pantoprazole 40 MG tablet Commonly known as:  PROTONIX Take one tablet by mouth twice daily for stomach What changed:    how much to take  how to take this  when to take this  additional instructions   Pen Needles 32G X 4 MM Misc at bedtime.   PRALUENT 75 MG/ML Sopn Generic drug:  Alirocumab Inject 75 mg into the skin every 14 (fourteen) days.   pramipexole 1 MG tablet Commonly known as:  MIRAPEX Take two tablets by mouth at bedtime for restless legs What changed:    how much to take  how to take this  when to take this  additional instructions      Follow-up Information    Yates DecampGanji, Alaisha Eversley, MD. Call.   Specialty:  Cardiology Why:  To be seen in 2 weeks.  Contact information: [address] [place] [address] [phone]            Yates DecampJay Camela Wich, MD 05/23/2017, 6:07 PM  Pager: [phone] Office: [phone] If no answer: [phone]

## 2017-05-23 NOTE — H&P (View-Only) (Signed)
Kenneth Begay Sr. is an 67 y.o. male.   Chief Complaint: Chest pain HPI: Kenneth Shankman Sr.  is a 67 y.o. male  With He has known CAD and hyperlipidemia, uncontrolled DM and restless leg and statin intolerance, doing well until this morning at 7:30 am suddenly developed severe chest pain with radiation towards upper chest and arms and partially relieved with NTG. He presented to our office and EMS activated due to unrelenting chest pain. He received 3 S.L NTG on route with decrease in CP from 10/10 top 4/10. No dyspnea, nausea, palpitations.  Denies any recent illnesses, had been doing well until this morning.  Past Medical History:  Diagnosis Date  . Barrett's esophagus    "we've been told that it's all gone; still take RX for GERD" (05/03/2014)  . CAD (coronary artery disease), native coronary artery    Coronary angiogram 05/03/2014:  Proximal LAD 3.0x12 mm Promus premier stent.  04/08/2014: Mid Cx 3.5 x 16 mm Promus DES.  03/29/2013: Mid LAD 2.75 x 38 mm Promus Premier drug-eluting stent, balloon angioplasty of D1 and distal LAD and stenting of distal RCA with 2.75 x 24 mm promos Premier drug-eluting stent 12/15/2012 widely patent.  . Cervicalgia   . Chronic bronchitis (HCC)    "sometimes 2-3 times/yr" (05/03/2014)  . Complication of anesthesia    " I do not wake up very well "  . Family history of adverse reaction to anesthesia    "son w/PONV"  . GERD (gastroesophageal reflux disease)   . H/O hiatal hernia   . Headache    "weekly" (05/03/2014)  . Heart murmur    "grew out of it"   . Hyperlipidemia   . Hypoglycemia, unspecified   . IDDM (insulin dependent diabetes mellitus) (HCC)   . Impotence of organic origin   . Other malaise and fatigue   . PONV (postoperative nausea and vomiting)   . Restless leg   . Tension headache   . Unspecified gastritis and gastroduodenitis with hemorrhage   . Unstable angina pectoris (HCC) 04/08/2014   Coronary angiogram 05/03/2014:  Proximal LAD 3.0x12 mm Promus premier  stent.  04/08/2014: Mid Cx 3.5 x 16 mm Promus DES.  03/29/2013: Mid LAD 2.75 x 38 mm Promus Premier drug-eluting stent, balloon angioplasty of D1 and distal LAD and stenting of distal RCA with 2.75 x 24 mm promos Premier drug-eluting stent 12/15/2012 widely patent.    Past Surgical History:  Procedure Laterality Date  . CARDIAC CATHETERIZATION N/A 09/25/2015   Procedure: Left Heart Cath and Coronary Angiography;  Surgeon: [name], MD;  Location: [facility] INVASIVE CV LAB;  Service: Cardiovascular;  Laterality: N/A;  . CARDIAC CATHETERIZATION N/A 09/25/2015   Procedure: Intravascular Pressure Wire/FFR Study;  Surgeon: [name], MD;  Location: [facility] INVASIVE CV LAB;  Service: Cardiovascular;  Laterality: N/A;  . CORONARY ANGIOPLASTY WITH STENT PLACEMENT  12/15/2012; 04/28/2014; 05/03/2014   "2; 1; 1"   . FRACTIONAL FLOW RESERVE WIRE Right 03/26/2013   Procedure: FRACTIONAL FLOW RESERVE WIRE;  Surgeon: Pamella Pert, MD;  Location: [facility] CATH LAB;  Service: Cardiovascular;  Laterality: Right;  . FRACTIONAL FLOW RESERVE WIRE N/A 05/03/2014   Procedure: FRACTIONAL FLOW RESERVE WIRE;  Surgeon: Pamella Pert, MD;  Location: [facility] CATH LAB;  Service: Cardiovascular;  Laterality: N/A;  . HERNIA REPAIR  2010   "umbilical"   . INCISION AND DRAINAGE ABSCESS Left 05/2007   "groin"   . KNEE SURGERY Right 1992;  2000   "calcium deposits removed" (12/15/2012)  .  LEFT HEART CATHETERIZATION WITH CORONARY ANGIOGRAM N/A 12/15/2012   Procedure: LEFT HEART CATHETERIZATION WITH CORONARY ANGIOGRAM;  Surgeon: Pamella PertJagadeesh R Nanea Jared, MD;  [facility];  Service: Cardiovascular;  Laterality: N/A;  . LEFT HEART CATHETERIZATION WITH CORONARY ANGIOGRAM N/A 03/26/2013   Procedure: LEFT HEART CATHETERIZATION WITH CORONARY ANGIOGRAM;  Surgeon: Pamella PertJagadeesh R Mekala Winger, MD;  [facility];  Service: Cardiovascular;  Laterality: N/A;  . LEFT HEART CATHETERIZATION WITH CORONARY ANGIOGRAM N/A 04/08/2014   Procedure: LEFT HEART  CATHETERIZATION WITH CORONARY ANGIOGRAM;  Surgeon: Micheline ChapmanMichael D Cooper, MD;  [facility];  Service: Cardiovascular;  Laterality: N/A;  . PERCUTANEOUS CORONARY STENT INTERVENTION (PCI-S) N/A 05/03/2014   Procedure: PERCUTANEOUS CORONARY STENT INTERVENTION (PCI-S);  Surgeon: Pamella PertJagadeesh R Donnesha Karg, MD;  [facility];  Service: Cardiovascular;  Laterality: N/A;  . UMBILICAL HERNIA REPAIR      Family History  Adopted: Yes  Family history unknown: Yes   Social History:  reports that he has never smoked. He has never used smokeless tobacco. He reports that he does not drink alcohol or use drugs.  Allergies:  Allergies  Allergen Reactions  . Lyrica [Pregabalin] Other (See Comments)    Made patient very lethargic the next morning, very hard to patient to function, move, etc.   . Statins Other (See Comments)    Causes Restless Legs, rhabdomyolysis   Review of Systems  Constitutional: Negative.   HENT: Negative.   Eyes: Negative.   Respiratory: Negative.   Cardiovascular: Positive for chest pain. Negative for palpitations, orthopnea, claudication, leg swelling and PND.  Gastrointestinal: Negative.   Genitourinary: Negative.   Musculoskeletal: Positive for joint pain.  Skin: Negative.   Neurological: Negative.   Endo/Heme/Allergies: Negative.   Psychiatric/Behavioral: Negative.   All other systems reviewed and are negative.  Physical Exam  Constitutional: He is oriented to person, place, and time. He appears well-developed and well-nourished.  HENT:  Head: Atraumatic.  Eyes: Conjunctivae are normal.  Neck: Normal range of motion. Neck supple.  Cardiovascular: Normal rate, regular rhythm, normal heart sounds and intact distal pulses. Exam reveals no gallop and no friction rub.  No murmur heard. Pulmonary/Chest: Effort normal and breath sounds normal.  Abdominal: Soft. Bowel sounds are normal.  Musculoskeletal: Normal range of motion.  Neurological: He is alert and oriented  to person, place, and time.  Skin: Skin is warm and dry.  Psychiatric: He has a normal mood and affect.  Labs:   Lab Results  Component Value Date   WBC 4.9 04/25/2017   HGB 13.8 04/25/2017   HCT 41.3 04/25/2017   MCV 80.4 04/25/2017   PLT 142 04/25/2017   Lipid Panel     Component Value Date/Time   CHOL 94 08/27/2016 0849   CHOL 86 (L) 07/03/2015 0816   TRIG 104 08/27/2016 0849   HDL 37 (L) 08/27/2016 0849   HDL 37 (L) 07/03/2015 0816   CHOLHDL 2.5 08/27/2016 0849   VLDL 21 08/27/2016 0849   LDLCALC 36 08/27/2016 0849   LDLCALC 19 07/03/2015 0816   HEMOGLOBIN A1C Lab Results  Component Value Date   HGBA1C 8.7 (H) 04/25/2017   MPG 203 04/25/2017     Current Outpatient Medications:  .  acetaminophen (TYLENOL) 500 MG tablet, Take 1,000-1,500 mg by mouth every 6 (six) hours as needed for headache., Disp: , Rfl:  .  Alirocumab (PRALUENT) 75 MG/ML SOPN, Inject 1 application into the skin every 14 (fourteen) days., Disp: , Rfl:  .  aspirin 81  MG EC tablet, Take 1 tablet (81 mg total) by mouth daily., Disp: 60 tablet, Rfl: 0 .  beclomethasone (QVAR) 40 MCG/ACT inhaler, Inhale 1 puff into the lungs 2 (two) times daily., Disp: 1 Inhaler, Rfl: 3 .  carvedilol (COREG) 6.25 MG tablet, Take 1/2 tablet by mouth twice daily with meals, Disp: 90 tablet, Rfl: 3 .  fluticasone (FLONASE) 50 MCG/ACT nasal spray, PLACE 1 SPRAY IN EACH NOSTRIL EVERY DAY, Disp: 16 g, Rfl: 1 .  gabapentin (NEURONTIN) 100 MG capsule, 4 by mouth at bedtime, Disp: , Rfl: 6 .  glimepiride (AMARYL) 1 MG tablet, TAKE ONE TABLET BY MOUTH ONCE DAILY AS NEEDED TO CONTROL BLOOD SUGAR, Disp: 30 tablet, Rfl: 5 .  Insulin Glargine (BASAGLAR KWIKPEN) 100 UNIT/ML SOPN, Inject 30 Units into the skin at bedtime., Disp: , Rfl:  .  Insulin Pen Needle (PEN NEEDLES) 32G X 4 MM MISC, at bedtime., Disp: , Rfl:  .  nitroGLYCERIN (NITROSTAT) 0.4 MG SL tablet, Place 1 tablet (0.4 mg total) under the tongue every 5 (five) minutes as  needed for chest pain., Disp: 50 tablet, Rfl: 0 .  pantoprazole (PROTONIX) 40 MG tablet, Take one tablet by mouth twice daily for stomach, Disp: 180 tablet, Rfl: 3 .  pramipexole (MIRAPEX) 1 MG tablet, Take two tablets by mouth at bedtime for restless legs, Disp: 180 tablet, Rfl: 0 .  tizanidine (ZANAFLEX) 2 MG capsule, Take 1 capsule (2 mg total) by mouth 2 (two) times daily as needed for muscle spasms., Disp: 30 capsule, Rfl: 0 .  vitamin C (ASCORBIC ACID) 500 MG tablet, Take 500 mg by mouth daily., Disp: , Rfl:   CARDIAC STUDIES:  EKG 05/23/2017: NSR, no ischemia    Assessment/Plan 1. Unstable angina pectoris 2. CAD of native vessels with unstable angina 3. Hyperlipidemia on Repatha 4. 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 5. Restless leg  Rec: In spite of normal EKG, his symptoms are very typical of [place]. Lipids controlled and DM Is improviing. Will proceed with urgent coronary angiogram and possible angioplasty.   Discussed risks, benefits and alternatives of angiogram including but not limited to <1% risk of death, stroke, MI, need for urgent surgical revascularization, renal failure, but not limited to thest. patient is willing to proceed.   [name], MD 05/23/2017, 9:46 AM [facility] Pager: [phone] Office: [phone] If no answer: Cell:  [phone]

## 2017-05-23 NOTE — Interval H&P Note (Signed)
History and Physical Interval Note:  05/23/2017 10:27 AM  Kenneth Sicalph Croswell Sr.  has presented today for surgery, with the diagnosis of chest pain  The various methods of treatment have been discussed with the patient and family. After consideration of risks, benefits and other options for treatment, the patient has consented to  Procedure(s): LEFT HEART CATH AND CORONARY ANGIOGRAPHY (N/A) and possible angioplasty as a surgical intervention .  The patient's history has been reviewed, patient examined, no change in status, stable for surgery.  I have reviewed the patient's chart and labs.  Questions were answered to the patient's satisfaction.   Cath Lab Visit (complete for each Cath Lab visit)  Clinical Evaluation Leading to the Procedure:   ACS: Yes.    Non-ACS:    Anginal Classification: CCS IV  Anti-ischemic medical therapy: Maximal Therapy (2 or more classes of medications)  Non-Invasive Test Results: No non-invasive testing performed  Prior CABG: No previous CABG        Yates DecampJay Charlcie Prisco

## 2017-05-26 MED FILL — Lidocaine HCl Local Inj 1%: INTRAMUSCULAR | Qty: 20 | Status: AC

## 2017-05-26 MED FILL — Heparin Sodium (Porcine) 2 Unit/ML in Sodium Chloride 0.9%: INTRAMUSCULAR | Qty: 1000 | Status: AC

## 2017-05-29 ENCOUNTER — Ambulatory Visit
Admission: RE | Admit: 2017-05-29 | Discharge: 2017-05-29 | Disposition: A | Discharge status: Home / Self Care | Payer: Medicare Other | Source: Ambulatory Visit | Attending: Cardiology | Admitting: Cardiology

## 2017-05-29 DIAGNOSIS — I872 Venous insufficiency (chronic) (peripheral): Secondary | ICD-10-CM

## 2017-05-29 DIAGNOSIS — L816 Other disorders of diminished melanin formation: Secondary | ICD-10-CM | POA: Diagnosis not present

## 2017-05-29 NOTE — Consult Note (Signed)
Chief Complaint: I have some spots on my legs.   Referring Physician(s): Ganji,Jay  History of Present Illness: Kenneth SicRalph Deneault Sr. is a 67 y.o. male presenting as a scheduled consult today to [facility], kindly referred by Dr. Jacinto HalimGanji, for evaluation of lower extremities for venous insufficiency.   Mr Larence Penningugh tells me that he doesn't notice many symptoms, and would not say that he notices any worse side, right versus left.  He tells me that the main concern of Dr. Jacinto HalimGanji was the hemosiderin deposition of the lower legs.    He denies any history of wounds on his legs.  He has never had a diagnosis of DVT or PE.  He has no real concern for venous varicose veins, though does recognize some telangiectasias, though is not concerned for these.    He has occasional swelling at the end of the day, but tells me this goes any by morning.  He does work standing most of the day, 10-12 hours at a time on hard floors, as he has his own business requiring machinery work.  He has only recently been prescribed compression stockings.    He does state that he has some restless legs type symptoms, but these do not seem to be life-style limiting.    He does use a few pillows at night for elevation of his head, but this is for GERD symptoms, and not for breathing problems.  He has a negative ECHO, with EF of 58%.   He underwent DVT study today and a physician-directed directed duplex for venous insufficiency of the bilateral lower extremities.  This was negative for DVT and for reflux.   Past Medical History:  Diagnosis Date  . Arthritis    "left thumb" (05/23/2017)  . Barrett's esophagus    "we've been told that it's all gone; still take RX for GERD" (05/23/2017)  . CAD (coronary artery disease), native coronary artery    Coronary angiogram 05/03/2014:  Proximal LAD 3.0x12 mm Promus premier stent.  04/08/2014: Mid Cx 3.5 x 16 mm Promus DES.  03/29/2013: Mid LAD 2.75 x 38 mm Promus Premier  drug-eluting stent, balloon angioplasty of D1 and distal LAD and stenting of distal RCA with 2.75 x 24 mm promos Premier drug-eluting stent 12/15/2012 widely patent.  . Cervicalgia   . Chronic bronchitis (HCC)    "q yr" (05/23/2017)  . Complication of anesthesia    " I do not wake up very well "  . Family history of adverse reaction to anesthesia    "son w/PONV"  . GERD (gastroesophageal reflux disease)   . Heart murmur    "grew out of it"   . Hyperlipidemia   . Hypoglycemia, unspecified   . IDDM (insulin dependent diabetes mellitus) (HCC)   . Impotence of organic origin   . Iron deficiency anemia   . Other malaise and fatigue   . PONV (postoperative nausea and vomiting)   . Restless leg   . Tension headache    "sometimes" (05/23/2017)  . Unspecified gastritis and gastroduodenitis with hemorrhage   . Unstable angina pectoris (HCC) 04/08/2014   Coronary angiogram 05/03/2014:  Proximal LAD 3.0x12 mm Promus premier stent.  04/08/2014: Mid Cx 3.5 x 16 mm Promus DES.  03/29/2013: Mid LAD 2.75 x 38 mm Promus Premier drug-eluting stent, balloon angioplasty of D1 and distal LAD and stenting of distal RCA with 2.75 x 24 mm promos Premier drug-eluting stent 12/15/2012 widely patent.    Past Surgical History:  Procedure Laterality Date  . CARDIAC CATHETERIZATION N/A 09/25/2015   Procedure: Left Heart Cath and Coronary Angiography;  Surgeon: [name], MD;  Location: [facility] INVASIVE CV LAB;  Service: Cardiovascular;  Laterality: N/A;  . CARDIAC CATHETERIZATION N/A 09/25/2015   Procedure: Intravascular Pressure Wire/FFR Study;  Surgeon: [name], MD;  Location: [facility] INVASIVE CV LAB;  Service: Cardiovascular;  Laterality: N/A;  . CORONARY ANGIOPLASTY WITH STENT PLACEMENT  12/15/2012; 04/28/2014; 05/03/2014   "2; 1; 1"   . FRACTIONAL FLOW RESERVE WIRE Right 03/26/2013   Procedure: FRACTIONAL FLOW RESERVE WIRE;  Surgeon: Pamella Pert, MD;  Location: [facility] CATH LAB;  Service: Cardiovascular;  Laterality: Right;  .  FRACTIONAL FLOW RESERVE WIRE N/A 05/03/2014   Procedure: FRACTIONAL FLOW RESERVE WIRE;  Surgeon: Pamella Pert, MD;  Location: [facility] CATH LAB;  Service: Cardiovascular;  Laterality: N/A;  . HERNIA REPAIR  2010   "umbilical"   . INCISION AND DRAINAGE ABSCESS Left 05/2007   "groin"   . KNEE SURGERY Right 1992;  2000   "calcium deposits removed" (12/15/2012)  . LEFT HEART CATH AND CORONARY ANGIOGRAPHY N/A 05/23/2017   Procedure: LEFT HEART CATH AND CORONARY ANGIOGRAPHY;  Surgeon: [name], MD;  Location: [facility] INVASIVE CV LAB;  Service: Cardiovascular;  Laterality: N/A;  . LEFT HEART CATHETERIZATION WITH CORONARY ANGIOGRAM N/A 12/15/2012   Procedure: LEFT HEART CATHETERIZATION WITH CORONARY ANGIOGRAM;  Surgeon: Pamella Pert, MD;  Location: [facility] CATH LAB;  Service: Cardiovascular;  Laterality: N/A;  . LEFT HEART CATHETERIZATION WITH CORONARY ANGIOGRAM N/A 03/26/2013   Procedure: LEFT HEART CATHETERIZATION WITH CORONARY ANGIOGRAM;  Surgeon: Pamella Pert, MD;  Location: [facility] CATH LAB;  Service: Cardiovascular;  Laterality: N/A;  . LEFT HEART CATHETERIZATION WITH CORONARY ANGIOGRAM N/A 04/08/2014   Procedure: LEFT HEART CATHETERIZATION WITH CORONARY ANGIOGRAM;  Surgeon: Micheline Chapman, MD;  Location: [facility] CATH LAB;  Service: Cardiovascular;  Laterality: N/A;  . PERCUTANEOUS CORONARY STENT INTERVENTION (PCI-S) N/A 05/03/2014   Procedure: PERCUTANEOUS CORONARY STENT INTERVENTION (PCI-S);  Surgeon: Pamella Pert, MD;  Location: [facility] CATH LAB;  Service: Cardiovascular;  Laterality: N/A;  . UMBILICAL HERNIA REPAIR      Allergies: Lyrica [pregabalin] and Statins  Medications: Prior to Admission medications   Medication Sig Start Date End Date Taking? Authorizing Provider  acetaminophen (TYLENOL) 500 MG tablet Take 1,000 mg by mouth every 6 (six) hours as needed for headache.     [provider]  albuterol (PROVENTIL HFA;VENTOLIN HFA) 108 (90 Base) MCG/ACT inhaler Inhale 2 puffs into the lungs  every 6 (six) hours as needed for wheezing or shortness of breath.    [provider]  Alirocumab (PRALUENT) 75 MG/ML SOPN Inject 75 mg into the skin every 14 (fourteen) days.     [provider]  amLODipine (NORVASC) 5 MG tablet Take 1 tablet (5 mg total) by mouth daily. 05/24/17   [name], MD  aspirin 81 MG EC tablet Take 1 tablet (81 mg total) by mouth daily. 12/16/12   [name], MD  carvedilol (COREG) 6.25 MG tablet Take 1/2 tablet by mouth twice daily with meals 06/17/16   Kimber Relic, MD  ferrous sulfate 325 (65 FE) MG tablet Take 325 mg by mouth daily with breakfast.    [provider]  fluticasone (FLONASE) 50 MCG/ACT nasal spray PLACE 1 SPRAY IN EACH NOSTRIL EVERY DAY Patient taking differently: 1 spray into each nostril daily as needed for congestion 01/30/17   Reed, Tiffany L, DO  gabapentin (  NEURONTIN) 100 MG capsule Take 400 mg by mouth at bedtime. 4 by mouth at bedtime  05/14/17   [provider]  glimepiride (AMARYL) 1 MG tablet TAKE ONE TABLET BY MOUTH ONCE DAILY AS NEEDED TO CONTROL BLOOD SUGAR Patient taking differently: 1mg  by mouth once daily as needed for blood sugar 12/30/16   Reed, Tiffany L, DO  Insulin Glargine (BASAGLAR KWIKPEN) 100 UNIT/ML SOPN Inject 30 Units into the skin at bedtime.    [provider]  Insulin Pen Needle (PEN NEEDLES) 32G X 4 MM MISC at bedtime.    [provider]  nitroGLYCERIN (NITROSTAT) 0.4 MG SL tablet Place 1 tablet (0.4 mg total) under the tongue every 5 (five) minutes as needed for chest pain. 05/22/17   Reed, Tiffany L, DO  pantoprazole (PROTONIX) 40 MG tablet Take one tablet by mouth twice daily for stomach Patient taking differently: Take 40 mg by mouth 2 (two) times daily.  06/17/16   Kimber Relic, MD  pramipexole (MIRAPEX) 1 MG tablet Take two tablets by mouth at bedtime for restless legs Patient taking differently: Take 2 mg by mouth at bedtime.  05/22/17   Kermit Balo, DO       Family History  Adopted: Yes  Family history unknown: Yes    Social History   Socioeconomic History  . Marital status: Married    Spouse name: Not on file  . Number of children: Not on file  . Years of education: Not on file  . Highest education level: Not on file  Occupational History  . Not on file  Social Needs  . Financial resource strain: Not hard at all  . Food insecurity:    Worry: Never true    Inability: Never true  . Transportation needs:    Medical: No    Non-medical: No  Tobacco Use  . Smoking status: Never Smoker  . Smokeless tobacco: Never Used  Substance and Sexual Activity  . Alcohol use: No  . Drug use: No  . Sexual activity: Not Currently  Lifestyle  . Physical activity:    Days per week: 0 days    Minutes per session: 0 min  . Stress: Only a little  Relationships  . Social connections:    Talks on phone: More than three times a week    Gets together: More than three times a week    Attends religious service: More than 4 times per year    Active member of club or organization: No    Attends meetings of clubs or organizations: Never    Relationship status: Married  Other Topics Concern  . Not on file  Social History Narrative  . Not on file    Review of Systems: A 12 point ROS discussed and pertinent positives are indicated in the HPI above.  All other systems are negative.  Review of Systems  Negative except for that in the present history.   Vital Signs: There were no vitals taken for this visit.  Physical Exam General: 67 yo male appearing younger than stated age.  Well-developed, well-nourished.  No distress. HEENT: Atraumatic, normocephalic. No glasses.  Conjugate gaze, extra-ocular motor intact. No scleral icterus or scleral injection. No lesions on external ears, nose, lips, or gums.  Oral mucosa moist, pink.  Neck: Symmetric with no goiter enlargement.  Chest/Lungs:  Symmetric chest with inspiration/expiration.  No labored  breathing.  Clear to auscultation with no wheezes, rhonchi, or rales.  Heart:  RRR, with no  third heart sounds appreciated. No JVD appreciated.  Abdomen:  Soft, NT/ND, with + bowel sounds.   Genito-urinary: Deferred Neurologic: Alert & Oriented to person, place, and time.   Normal affect and insight.  Appropriate questions.  Moving all 4 extremities with gross sensory intact.   Extremities: No wounds of the lower extremity.  No edema.  Minimal telangiectasias.  Mild hemosiderin deposition on the right > left anterior calf.    Imaging: No results found.  Labs:  CBC: Recent Labs    08/27/16 0849 12/26/16 0817 04/25/17 0920 05/23/17 0955 05/23/17 1035  WBC 4.4 4.1 4.9 5.0  --   HGB 13.6 11.9* 13.8 14.0 13.3  HCT 41.0 36.2* 41.3 40.3 39.0  PLT 136* 134* 142 121*  --     COAGS: Recent Labs    05/23/17 0955  INR 1.10    BMP: Recent Labs    08/27/16 0849 12/26/16 0817 04/25/17 0920 05/23/17 0955 05/23/17 1035  NA 136 138 139 133* 138  K 4.9 4.3 4.5 4.2 4.1  CL 102 104 104 100* 99*  CO2 27 27 26 22   --   GLUCOSE 143* 129* 111* 245* 235*  BUN 24 24 18 13 16   CALCIUM 9.3 9.3 9.3 8.8*  --   CREATININE 1.05 1.08 1.08 1.09 0.90  GFRNONAA  --  71  --  >60  --   GFRAA  --  82  --  >60  --     LIVER FUNCTION TESTS: Recent Labs    12/26/16 0817 05/23/17 0955  BILITOT 0.4 0.6  AST 24 23  ALT 25 25  ALKPHOS  --  53  PROT 6.8 6.8  ALBUMIN  --  3.9    TUMOR MARKERS: No results for input(s): AFPTM, CEA, CA199, CHROMGRNA in the last 8760 hours.  Assessment and Plan:  Mr Harrison is 67 year old male with bilateral CEAP-4 category venous disease, given presence of hemosiderin deposition/skin changes.    Directed duplex shows no evidence of chronic venous insufficiency.    I had a thorough discussion with Mr Vantine of the anatomy/pathology/pathophysiology of venous disease, as well as the treatment options.  I did tell him the negative result of his study, and that he would  not be a candidate currently for endovenous therapy.    I also discussed the importance of compression therapy, which is something that has been already introduced by Dr. [name].  He understands, and is willing to use knee-high compression therapy.    At this time, there is no need for surveillance duplex, and of course we can see him again should he have worsening of any symptoms.    Thank you for this interesting consult.  I greatly enjoyed meeting Luisalberto Beegle Sr. and look forward to participating in their care.  A copy of this report was sent to the requesting provider on this date.  Electronically Signed: Gilmer Mor 05/29/2017, 9:35 AM   I spent a total of  40 Minutes   in face to face in clinical consultation, greater than 50% of which was counseling/coordinating care for chronic venous disease, bilateral lower extremity.

## 2017-05-31 ENCOUNTER — Other Ambulatory Visit: Payer: Self-pay | Admitting: Internal Medicine

## 2017-06-01 ENCOUNTER — Other Ambulatory Visit: Payer: Self-pay | Admitting: Internal Medicine

## 2017-06-01 DIAGNOSIS — D509 Iron deficiency anemia, unspecified: Secondary | ICD-10-CM

## 2017-06-02 NOTE — Telephone Encounter (Signed)
Refill denied 

## 2017-06-02 NOTE — Telephone Encounter (Signed)
This medication is no longer on his med list and was discontinued, is this refill ok?

## 2017-06-02 NOTE — Telephone Encounter (Signed)
Pt is no longer anemic so he does not require iron at this time.  He had stopped it prior to his last visit.

## 2017-06-09 ENCOUNTER — Ambulatory Visit: Payer: Medicare Other | Admitting: Pharmacotherapy

## 2017-06-14 ENCOUNTER — Other Ambulatory Visit: Payer: Self-pay | Admitting: Internal Medicine

## 2017-06-16 DIAGNOSIS — I2510 Atherosclerotic heart disease of native coronary artery without angina pectoris: Secondary | ICD-10-CM | POA: Diagnosis not present

## 2017-06-16 DIAGNOSIS — I209 Angina pectoris, unspecified: Secondary | ICD-10-CM | POA: Diagnosis not present

## 2017-06-16 DIAGNOSIS — Z9861 Coronary angioplasty status: Secondary | ICD-10-CM | POA: Diagnosis not present

## 2017-06-16 DIAGNOSIS — R0602 Shortness of breath: Secondary | ICD-10-CM | POA: Diagnosis not present

## 2017-06-20 DIAGNOSIS — R0602 Shortness of breath: Secondary | ICD-10-CM | POA: Diagnosis not present

## 2017-06-23 ENCOUNTER — Other Ambulatory Visit: Payer: Self-pay | Admitting: Internal Medicine

## 2017-06-23 DIAGNOSIS — I25118 Atherosclerotic heart disease of native coronary artery with other forms of angina pectoris: Secondary | ICD-10-CM

## 2017-06-24 DIAGNOSIS — R0602 Shortness of breath: Secondary | ICD-10-CM | POA: Diagnosis not present

## 2017-07-15 LAB — HM DIABETES EYE EXAM

## 2017-08-14 ENCOUNTER — Other Ambulatory Visit: Payer: Self-pay | Admitting: *Deleted

## 2017-08-14 MED ORDER — PANTOPRAZOLE SODIUM 40 MG PO TBEC
DELAYED_RELEASE_TABLET | ORAL | 3 refills | Status: DC
Start: 2017-08-14 — End: 2018-02-12

## 2017-08-14 MED ORDER — CARVEDILOL 6.25 MG PO TABS: ORAL_TABLET | ORAL | 3 refills | Status: DC

## 2017-08-14 NOTE — Telephone Encounter (Signed)
[facility] [address] 

## 2017-08-30 ENCOUNTER — Other Ambulatory Visit: Payer: Self-pay | Admitting: Internal Medicine

## 2017-09-07 ENCOUNTER — Other Ambulatory Visit: Payer: Self-pay | Admitting: Internal Medicine

## 2017-10-07 ENCOUNTER — Other Ambulatory Visit: Payer: Medicare Other

## 2017-10-07 DIAGNOSIS — E1165 Type 2 diabetes mellitus with hyperglycemia: Principal | ICD-10-CM

## 2017-10-07 DIAGNOSIS — E1159 Type 2 diabetes mellitus with other circulatory complications: Secondary | ICD-10-CM | POA: Diagnosis not present

## 2017-10-07 DIAGNOSIS — I10 Essential (primary) hypertension: Secondary | ICD-10-CM | POA: Diagnosis not present

## 2017-10-07 DIAGNOSIS — D509 Iron deficiency anemia, unspecified: Secondary | ICD-10-CM

## 2017-10-07 DIAGNOSIS — E785 Hyperlipidemia, unspecified: Secondary | ICD-10-CM

## 2017-10-07 DIAGNOSIS — I25118 Atherosclerotic heart disease of native coronary artery with other forms of angina pectoris: Secondary | ICD-10-CM | POA: Diagnosis not present

## 2017-10-07 DIAGNOSIS — Z794 Long term (current) use of insulin: Principal | ICD-10-CM

## 2017-10-07 DIAGNOSIS — IMO0002 Reserved for concepts with insufficient information to code with codable children: Secondary | ICD-10-CM

## 2017-10-08 LAB — CBC WITH DIFFERENTIAL/PLATELET
Basophils Absolute: 18 cells/uL (ref 0–200)
Basophils Relative: 0.4 %
Eosinophils Absolute: 68 cells/uL (ref 15–500)
Eosinophils Relative: 1.5 %
HCT: 43 % (ref 38.5–50.0)
Hemoglobin: 14.5 g/dL (ref 13.2–17.1)
Lymphs Abs: 1260 cells/uL (ref 850–3900)
MCH: 28.5 pg (ref 27.0–33.0)
MCHC: 33.7 g/dL (ref 32.0–36.0)
MCV: 84.5 fL (ref 80.0–100.0)
MPV: 10.7 fL (ref 7.5–12.5)
Monocytes Relative: 8.4 %
Neutro Abs: 2777 cells/uL (ref 1500–7800)
Neutrophils Relative %: 61.7 %
Platelets: 114 10*3/uL — ABNORMAL LOW (ref 140–400)
RBC: 5.09 10*6/uL (ref 4.20–5.80)
RDW: 13.6 % (ref 11.0–15.0)
Total Lymphocyte: 28 %
WBC mixed population: 378 cells/uL (ref 200–950)
WBC: 4.5 10*3/uL (ref 3.8–10.8)

## 2017-10-08 LAB — LIPID PANEL
Cholesterol: 167 mg/dL (ref <200)
HDL: 36 mg/dL — ABNORMAL LOW (ref >40)
LDL Cholesterol (Calc): 106 mg/dL (calc) — ABNORMAL HIGH
Non-HDL Cholesterol (Calc): 131 mg/dL (calc) — ABNORMAL HIGH (ref <130)
Total CHOL/HDL Ratio: 4.6 (calc) (ref <5.0)
Triglycerides: 139 mg/dL (ref <150)

## 2017-10-08 LAB — COMPLETE METABOLIC PANEL WITH GFR
AG Ratio: 1.7 (calc) (ref 1.0–2.5)
ALT: 23 U/L (ref 9–46)
AST: 18 U/L (ref 10–35)
Albumin: 4.2 g/dL (ref 3.6–5.1)
Alkaline phosphatase (APISO): 55 U/L (ref 40–115)
BUN: 19 mg/dL (ref 7–25)
CO2: 27 mmol/L (ref 20–32)
Calcium: 9.1 mg/dL (ref 8.6–10.3)
Chloride: 103 mmol/L (ref 98–110)
Creat: 1.07 mg/dL (ref 0.70–1.25)
GFR, Est African American: 82 mL/min/{1.73_m2} (ref >=60)
GFR, Est Non African American: 71 mL/min/{1.73_m2} (ref >=60)
Globulin: 2.5 g/dL (calc) (ref 1.9–3.7)
Glucose, Bld: 202 mg/dL — ABNORMAL HIGH (ref 65–99)
Potassium: 4.5 mmol/L (ref 3.5–5.3)
Sodium: 137 mmol/L (ref 135–146)
Total Bilirubin: 0.7 mg/dL (ref 0.2–1.2)
Total Protein: 6.7 g/dL (ref 6.1–8.1)

## 2017-10-08 LAB — HEMOGLOBIN A1C
Hgb A1c MFr Bld: 8.9 % of total Hgb — ABNORMAL HIGH (ref <5.7)
Mean Plasma Glucose: 209 (calc)
eAG (mmol/L): 11.6 (calc)

## 2017-10-09 ENCOUNTER — Encounter: Payer: Self-pay | Admitting: Internal Medicine

## 2017-10-09 ENCOUNTER — Ambulatory Visit (INDEPENDENT_AMBULATORY_CARE_PROVIDER_SITE_OTHER): Payer: Medicare Other | Admitting: Internal Medicine

## 2017-10-09 VITALS — BP 136/70 | HR 67 | Temp 97.7°F | Ht 68.0 in | Wt 182.0 lb

## 2017-10-09 DIAGNOSIS — E1151 Type 2 diabetes mellitus with diabetic peripheral angiopathy without gangrene: Secondary | ICD-10-CM | POA: Diagnosis not present

## 2017-10-09 DIAGNOSIS — J410 Simple chronic bronchitis: Secondary | ICD-10-CM

## 2017-10-09 DIAGNOSIS — I25118 Atherosclerotic heart disease of native coronary artery with other forms of angina pectoris: Secondary | ICD-10-CM | POA: Diagnosis not present

## 2017-10-09 DIAGNOSIS — E1159 Type 2 diabetes mellitus with other circulatory complications: Secondary | ICD-10-CM

## 2017-10-09 DIAGNOSIS — IMO0002 Reserved for concepts with insufficient information to code with codable children: Secondary | ICD-10-CM

## 2017-10-09 DIAGNOSIS — E1165 Type 2 diabetes mellitus with hyperglycemia: Secondary | ICD-10-CM | POA: Diagnosis not present

## 2017-10-09 DIAGNOSIS — I201 Angina pectoris with documented spasm: Secondary | ICD-10-CM | POA: Diagnosis not present

## 2017-10-09 DIAGNOSIS — Z794 Long term (current) use of insulin: Secondary | ICD-10-CM | POA: Diagnosis not present

## 2017-10-09 DIAGNOSIS — G2581 Restless legs syndrome: Secondary | ICD-10-CM

## 2017-10-09 MED ORDER — DAPAGLIFLOZIN PROPANEDIOL 5 MG PO TABS: 5.0000 mg | ORAL_TABLET | Freq: Every day | ORAL | 0 refills | Status: DC

## 2017-10-09 MED ORDER — ALBUTEROL SULFATE HFA 108 (90 BASE) MCG/ACT IN AERS: 2.0000 | INHALATION_SPRAY | Freq: Four times a day (QID) | RESPIRATORY_TRACT | 3 refills | Status: DC | PRN

## 2017-10-09 NOTE — Progress Notes (Signed)
[facility] Provider:  Cherryl Babin L. Renato Gailseed, D.O., C.M.D.  Code Status: full code Goals of Care:  Advanced Directives 05/23/2017  Does Patient Have a Medical Advance Directive? No  Does patient want to make changes to medical advance directive? -  Would patient like information on creating a medical advance directive? No - Patient declined  Pre-existing out of facility DNR order (yellow form or pink MOST form) -   Chief Complaint  Patient presents with  . Medical Management of Chronic Issues    4mth follow-up    HPI: Patient is a 67 y.o. male seen today for medical management of chronic diseases.    Since I last saw him, he saw Dr. Jacinto HalimGanji the very next day--there he was having severe chest pain with partial relief with nitroglycerin and was taken to the cath lab for angina pectoris It was determined to most likely be due to coronary spasm in the outflow of his mid LAD stent.  He did have CAD of the native vessel.  His previous stents were patent.  It looks like his zanaflex muscle spasm medication was stopped during that stay.  No pains since then.  It had been very sudden--had felt like someone drove a truck up on his chest.  Reports having ABIs which were clear.  LDL remains high.  I had noted in his labs that he could not afford repatha before, but he is on praluent--last visit he was forgetting to take the medication.  Did not tolerate statins.  He is using his praluent monthly.  It's $600 for each dose.    hba1c has trended up to 8.9.  He says he needs help on his sugar--he can not eat, eat right, and get ready to go to bed and it's 300, get up and it's 80.  It's swinging a ton.  He's interested in seeing a dietitian. He does eat out more than at home b/c he's on the road a lot with work.  He goes 6-7 hrs w/o eating, then eats, shoots sugar up, takes insulin, then crashes.  This morning it was 174.  Last meal was 9pm last night which was a cup of cheerios.  Last night was 334.  He  normally takes 40 units at bedtime, but took 30 b/c he sometimes crashes.  He's had a diabetes diagnosis for 8 years after being hypoglycemic for 60 years.  He only uses his amaryl if his sugar is over 120.  Takes two if sugar like 200.  Had his eye exam a month ago.  Dr. Burundiman.  No retinopathy.  Mirapex dose was changed 06/05/17 when he needed a refill (Dr. Lucianne MussKumar at [facility]).  Gabapentin was added.  He's tapering off the mirapex.  He's on 2 0.25mg  mirapex tabs at night and 4 gabapentins.  He fell asleep at 10:30 and woke up at 2am.  If he sleeps all night, it takes him 2 hrs to get functioning in the morning.    Past Medical History:  Diagnosis Date  . Arthritis    "left thumb" (05/23/2017)  . Barrett's esophagus    "we've been told that it's all gone; still take RX for GERD" (05/23/2017)  . CAD (coronary artery disease), native coronary artery    Coronary angiogram 05/03/2014:  Proximal LAD 3.0x12 mm Promus premier stent.  04/08/2014: Mid Cx 3.5 x 16 mm Promus DES.  03/29/2013: Mid LAD 2.75 x 38 mm Promus Premier drug-eluting stent, balloon angioplasty of D1  and distal LAD and stenting of distal RCA with 2.75 x 24 mm promos Premier drug-eluting stent 12/15/2012 widely patent.  . Cervicalgia   . Chronic bronchitis (HCC)    "q yr" (05/23/2017)  . Complication of anesthesia    " I do not wake up very well "  . Family history of adverse reaction to anesthesia    "son w/PONV"  . GERD (gastroesophageal reflux disease)   . Heart murmur    "grew out of it"   . Hyperlipidemia   . Hypoglycemia, unspecified   . IDDM (insulin dependent diabetes mellitus) (HCC)   . Impotence of organic origin   . Iron deficiency anemia   . Other malaise and fatigue   . PONV (postoperative nausea and vomiting)   . Restless leg   . Tension headache    "sometimes" (05/23/2017)  . Unspecified gastritis and gastroduodenitis with hemorrhage   . Unstable angina pectoris (HCC) 04/08/2014   Coronary  angiogram 05/03/2014:  Proximal LAD 3.0x12 mm Promus premier stent.  04/08/2014: Mid Cx 3.5 x 16 mm Promus DES.  03/29/2013: Mid LAD 2.75 x 38 mm Promus Premier drug-eluting stent, balloon angioplasty of D1 and distal LAD and stenting of distal RCA with 2.75 x 24 mm promos Premier drug-eluting stent 12/15/2012 widely patent.    Past Surgical History:  Procedure Laterality Date  . CARDIAC CATHETERIZATION N/A 09/25/2015   Procedure: Left Heart Cath and Coronary Angiography;  Surgeon: [name], MD;  Location: [facility] INVASIVE CV LAB;  Service: Cardiovascular;  Laterality: N/A;  . CARDIAC CATHETERIZATION N/A 09/25/2015   Procedure: Intravascular Pressure Wire/FFR Study;  Surgeon: [name], MD;  Location: [facility] INVASIVE CV LAB;  Service: Cardiovascular;  Laterality: N/A;  . CORONARY ANGIOPLASTY WITH STENT PLACEMENT  12/15/2012; 04/28/2014; 05/03/2014   "2; 1; 1"   . FRACTIONAL FLOW RESERVE WIRE Right 03/26/2013   Procedure: FRACTIONAL FLOW RESERVE WIRE;  Surgeon: Pamella Pert, MD;  Location: [facility] CATH LAB;  Service: Cardiovascular;  Laterality: Right;  . FRACTIONAL FLOW RESERVE WIRE N/A 05/03/2014   Procedure: FRACTIONAL FLOW RESERVE WIRE;  Surgeon: Pamella Pert, MD;  Location: [facility] CATH LAB;  Service: Cardiovascular;  Laterality: N/A;  . HERNIA REPAIR  2010   "umbilical"   . INCISION AND DRAINAGE ABSCESS Left 05/2007   "groin"   . KNEE SURGERY Right 1992;  2000   "calcium deposits removed" (12/15/2012)  . LEFT HEART CATH AND CORONARY ANGIOGRAPHY N/A 05/23/2017   Procedure: LEFT HEART CATH AND CORONARY ANGIOGRAPHY;  Surgeon: [name], MD;  Location: [facility] INVASIVE CV LAB;  Service: Cardiovascular;  Laterality: N/A;  . LEFT HEART CATHETERIZATION WITH CORONARY ANGIOGRAM N/A 12/15/2012   Procedure: LEFT HEART CATHETERIZATION WITH CORONARY ANGIOGRAM;  Surgeon: Pamella Pert, MD;  Location: [facility] CATH LAB;  Service: Cardiovascular;  Laterality: N/A;  . LEFT HEART CATHETERIZATION WITH CORONARY ANGIOGRAM N/A 03/26/2013    Procedure: LEFT HEART CATHETERIZATION WITH CORONARY ANGIOGRAM;  Surgeon: Pamella Pert, MD;  Location: [facility] CATH LAB;  Service: Cardiovascular;  Laterality: N/A;  . LEFT HEART CATHETERIZATION WITH CORONARY ANGIOGRAM N/A 04/08/2014   Procedure: LEFT HEART CATHETERIZATION WITH CORONARY ANGIOGRAM;  Surgeon: Micheline Chapman, MD;  Location: [facility] CATH LAB;  Service: Cardiovascular;  Laterality: N/A;  . PERCUTANEOUS CORONARY STENT INTERVENTION (PCI-S) N/A 05/03/2014   Procedure: PERCUTANEOUS CORONARY STENT INTERVENTION (PCI-S);  Surgeon: Pamella Pert, MD;  Location: [facility] CATH LAB;  Service: Cardiovascular;  Laterality: N/A;  . UMBILICAL HERNIA REPAIR      Allergies  Allergen Reactions  . Lyrica [Pregabalin] Other (See Comments)    Made patient very lethargic the next morning, very hard to patient to function, move, etc.   . Statins Other (See Comments)    Causes Restless Legs, rhabdomyolysis    Outpatient Encounter Medications as of 10/09/2017  Medication Sig  . acetaminophen (TYLENOL) 500 MG tablet Take 1,000 mg by mouth every 6 (six) hours as needed for headache.   . albuterol (PROVENTIL HFA;VENTOLIN HFA) 108 (90 Base) MCG/ACT inhaler Inhale 2 puffs into the lungs every 6 (six) hours as needed for wheezing or shortness of breath.  . Alirocumab (PRALUENT) 75 MG/ML SOPN Inject 75 mg into the skin every 14 (fourteen) days.   Marland Kitchen amLODipine (NORVASC) 5 MG tablet Take 1 tablet (5 mg total) by mouth daily.  Marland Kitchen aspirin 81 MG EC tablet Take 1 tablet (81 mg total) by mouth daily.  . carvedilol (COREG) 6.25 MG tablet Take 1/2 tablet by mouth twice daily with meals  . gabapentin (NEURONTIN) 100 MG capsule Take 400 mg by mouth at bedtime. 4 by mouth at bedtime   . Insulin Glargine (BASAGLAR KWIKPEN) 100 UNIT/ML SOPN INJECT 0.4 MLS (40 UNITS TOTAL) INTO THE SKIN AT BEDTIME.  . Insulin Pen Needle (PEN NEEDLES) 32G X 4 MM MISC at bedtime.  . nitroGLYCERIN (NITROSTAT) 0.4 MG SL tablet PLACE 1 TABLET (0.4 MG  TOTAL) UNDER THE TONGUE EVERY 5 (FIVE) MINUTES AS NEEDED FOR CHEST PAIN.  Marland Kitchen pantoprazole (PROTONIX) 40 MG tablet Take one tablet by mouth twice daily for stomach  . pramipexole (MIRAPEX) 1 MG tablet Take 2 mg by mouth at bedtime.  . [DISCONTINUED] glimepiride (AMARYL) 1 MG tablet TAKE ONE TABLET BY MOUTH ONCE DAILY AS NEEDED TO CONTROL BLOOD SUGAR  . [DISCONTINUED] pramipexole (MIRAPEX) 1 MG tablet Take two tablets by mouth at bedtime for restless legs (Patient taking differently: Take 2 mg by mouth at bedtime. )  . [DISCONTINUED] ferrous sulfate 325 (65 FE) MG tablet Take 325 mg by mouth daily with breakfast.  . [DISCONTINUED] fluticasone (FLONASE) 50 MCG/ACT nasal spray PLACE 1 SPRAY IN EACH NOSTRIL EVERY DAY (Patient taking differently: 1 spray into each nostril daily as needed for congestion)   No facility-administered encounter medications on file as of 10/09/2017.     Review of Systems:  Review of Systems  Constitutional: Negative for chills and fever.  HENT: Negative for congestion.   Eyes: Negative for blurred vision.  Respiratory: Negative for cough and shortness of breath.   Cardiovascular: Negative for chest pain, palpitations and leg swelling.  Gastrointestinal: Negative for abdominal pain, blood in stool, constipation and melena.  Genitourinary: Negative for dysuria.  Musculoskeletal: Negative for falls and joint pain.  Skin: Negative for itching and rash.  Neurological: Positive for tingling, tremors and sensory change. Negative for dizziness and loss of consciousness.       Restless legs  Endo/Heme/Allergies: Does not bruise/bleed easily.  Psychiatric/Behavioral: Negative for depression and memory loss. The patient is not nervous/anxious and does not have insomnia.     Health Maintenance  Topic Date Due  . OPHTHALMOLOGY EXAM  12/20/2016  . URINE MICROALBUMIN  08/27/2017  . INFLUENZA VACCINE  10/02/2017  . Hepatitis C Screening  05/23/2018 (Originally 1951-02-23)  . PNA  vac Low Risk Adult (2 of 2 - PPSV23) 05/23/2018 (Originally 12/02/2014)  . HEMOGLOBIN A1C  04/09/2018  . FOOT EXAM  05/23/2018  . COLONOSCOPY  10/18/2025  . TETANUS/TDAP  03/01/2027    Physical Exam: Vitals:  10/09/17 0741  BP: 136/70  Pulse: 67  Temp: 97.7 F (36.5 C)  TempSrc: Oral  SpO2: 96%  Weight: 182 lb (82.6 kg)  Height: 5\' 8"  (1.727 m)   Body mass index is 27.67 kg/m. Physical Exam  Constitutional: He is oriented to person, place, and time. He appears well-developed and well-nourished. No distress.  Cardiovascular: Normal rate, regular rhythm, normal heart sounds and intact distal pulses.  Pulmonary/Chest: Effort normal and breath sounds normal. No respiratory distress.  Abdominal: Bowel sounds are normal.  Musculoskeletal: Normal range of motion.  Neurological: He is alert and oriented to person, place, and time.  tremor  Skin: Skin is warm and dry.  Psychiatric: He has a normal mood and affect.    Labs reviewed: Basic Metabolic Panel: Recent Labs    04/25/17 0920 05/23/17 0955 05/23/17 1035 10/07/17 0840  NA 139 133* 138 137  K 4.5 4.2 4.1 4.5  CL 104 100* 99* 103  CO2 26 22  --  27  GLUCOSE 111* 245* 235* 202*  BUN 18 13 16 19   CREATININE 1.08 1.09 0.90 1.07  CALCIUM 9.3 8.8*  --  9.1   Liver Function Tests: Recent Labs    12/26/16 0817 05/23/17 0955 10/07/17 0840  AST 24 23 18   ALT 25 25 23   ALKPHOS  --  53  --   BILITOT 0.4 0.6 0.7  PROT 6.8 6.8 6.7  ALBUMIN  --  3.9  --    No results for input(s): LIPASE, AMYLASE in the last 8760 hours. No results for input(s): AMMONIA in the last 8760 hours. CBC: Recent Labs    12/26/16 0817 04/25/17 0920 05/23/17 0955 05/23/17 1035 10/07/17 0840  WBC 4.1 4.9 5.0  --  4.5  NEUTROABS 2,485 2,940  --   --  2,777  HGB 11.9* 13.8 14.0 13.3 14.5  HCT 36.2* 41.3 40.3 39.0 43.0  MCV 78.7* 80.4 82.9  --  84.5  PLT 134* 142 121*  --  114*   Lipid Panel: Recent Labs    10/07/17 0840  CHOL 167    HDL 36*  LDLCALC 106*  TRIG 139  CHOLHDL 4.6   Lab Results  Component Value Date   HGBA1C 8.9 (H) 10/07/2017    Procedures since last visit: See hpi  Assessment/Plan 1. 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 Coronary artery disease involving native coronary artery of native heart with other form of angina pectoris (HCC) - stents open on cath in march '19 - cont secondary prevention - Lipid panel; Future  3. Uncontrolled diabetes mellitus with circulatory complication, with long-term current use of insulin (HCC) - hba1c worsening - on lantus alone which he's self adjusting to avoid hypoglycemia and on amaryl in the morning which he also takes based on his glucose rather than daily - Amb ref to Medical Nutrition Therapy-MNT--needs nutritional counseling--not eating consistently and making poor choices it seems - add farxiga to lantus -stop amaryl - decrease lantus to 30 units until adjusts to farxiga - dapagliflozin propanediol (FARXIGA) 5 MG TABS tablet; Take 5 mg by mouth daily.  Dispense: 14 tablet; Refill: 0 - COMPLETE METABOLIC PANEL WITH GFR; Future - Hemoglobin A1c; Future  4. Restless legs syndrome (RLS) -cont current regimen per neurology with gabapentin uptitration and mirapex reduction  5. Simple chronic bronchitis (HCC) - more hoarseness with singing so requests renewal of albuterol -  albuterol (PROVENTIL HFA;VENTOLIN HFA) 108 (90 Base) MCG/ACT inhaler; Inhale 2 puffs into the lungs every 6 (six) hours as needed for wheezing or shortness of breath.  Dispense: 18 g; Refill: 3  6. Uncontrolled diabetes mellitus with peripheral circulatory disorder (HCC) - see #3 - Hemoglobin A1c; Future  Labs/tests ordered:  Orders Placed This Encounter  Procedures  . COMPLETE METABOLIC PANEL WITH GFR    Standing Status:   Future    Standing Expiration Date:    06/10/2018  . Hemoglobin A1c    Standing Status:   Future    Standing Expiration Date:   06/10/2018  . Lipid panel    Standing Status:   Future    Standing Expiration Date:   06/10/2018  . Amb ref to Medical Nutrition Therapy-MNT    Referral Priority:   Routine    Referral Type:   Consultation    Referral Reason:   Specialty Services Required    Requested Specialty:   Nutrition    Number of Visits Requested:   1    Next appt:  02/12/2018 med mgt, fasting labs before   Tagen Brethauer L. Windy Dudek, D.O. Geriatrics [facility] [address], [place] [address] Cell Phone (Mon-Fri 8am-5pm):  [phone] On Call:  [phone] & follow prompts after 5pm & weekends Office Phone:  [phone] Office Fax:  [phone]

## 2017-10-17 DIAGNOSIS — M1990 Unspecified osteoarthritis, unspecified site: Secondary | ICD-10-CM | POA: Diagnosis not present

## 2017-10-17 DIAGNOSIS — G4710 Hypersomnia, unspecified: Secondary | ICD-10-CM | POA: Diagnosis not present

## 2017-10-17 DIAGNOSIS — G2581 Restless legs syndrome: Secondary | ICD-10-CM | POA: Diagnosis not present

## 2017-10-17 DIAGNOSIS — Z862 Personal history of diseases of the blood and blood-forming organs and certain disorders involving the immune mechanism: Secondary | ICD-10-CM | POA: Diagnosis not present

## 2017-11-05 ENCOUNTER — Encounter: Payer: Medicare Other | Attending: Internal Medicine | Admitting: *Deleted

## 2017-11-05 DIAGNOSIS — E1165 Type 2 diabetes mellitus with hyperglycemia: Secondary | ICD-10-CM | POA: Diagnosis not present

## 2017-11-05 DIAGNOSIS — Z713 Dietary counseling and surveillance: Secondary | ICD-10-CM | POA: Diagnosis not present

## 2017-11-05 DIAGNOSIS — Z794 Long term (current) use of insulin: Secondary | ICD-10-CM | POA: Diagnosis not present

## 2017-11-05 DIAGNOSIS — E1159 Type 2 diabetes mellitus with other circulatory complications: Secondary | ICD-10-CM | POA: Insufficient documentation

## 2017-11-05 DIAGNOSIS — E1151 Type 2 diabetes mellitus with diabetic peripheral angiopathy without gangrene: Secondary | ICD-10-CM

## 2017-11-05 NOTE — Patient Instructions (Signed)
Plan:  Aim for 3 Carb Choices per meal (45 grams) +/- 1 either way  Aim for 0-2 Carbs per snack if hungry  Include protein in moderation with your meals and snacks Continue reading food labels for Total Carbohydrate  of foods Continue with your activity level daily as tolerated Continue checking BG at alternate times per day  Continue taking medication as directed by MD I've informed you of the [place] sensor product to look into

## 2017-11-06 NOTE — Progress Notes (Signed)
Diabetes Self-Management Education  Visit Type: First/Initial  Appt. Start Time: 0830 Appt. End Time: 1000  11/06/2017  [name], identified by name and date of birth, is a 67 y.o. male with a diagnosis of Diabetes: Type 2. He is here with his wife [name], who participated in the visit in a positive way. Patient wold like to learn more about food choices as well as overall diabetes education. He is concerned about frequent BG excursions as well as sudden drops in BG and resulting in potential hypoglycemia. He has restless leg syndrome so doesn't sit for very long. 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 Very social and active lifestyle. Most meals are picked up from restaurants, very little cooking at home.  ASSESSMENT  Height _0  (1.727 m), weight 178 lb 1.6 oz (80.8 kg). Body mass index is 27.08 kg/m.  Diabetes Self-Management Education - 11/05/17 0851      Visit Information   Visit Type  First/Initial      Initial Visit   Diabetes Type  Type 2    Are you currently following a meal plan?  No    Are you taking your medications as prescribed?  Yes    Date Diagnosed  2011      Health Coping   How would you rate your overall health?  Fair      Psychosocial Assessment   Self-care barriers  None    Self-management support  Family    Other persons present  Patient;Spouse/SO    Patient Concerns  Nutrition/Meal planning;Glycemic Control    Special Needs  None    Preferred Learning Style  No preference indicated    Learning Readiness  Change in progress    How often do you need to have someone help you when you read instructions, pamphlets, or other written materials from your doctor or pharmacy?  2 - Rarely    What is the last grade level you completed in school?  12      Pre-Education Assessment   Patient understands the diabetes disease and treatment process.  Needs Review    Patient understands incorporating nutritional management into lifestyle.  Needs Instruction    Patient undertands incorporating physical activity into lifestyle.  Needs Review    Patient understands using medications safely.  Needs Review    Patient understands monitoring blood glucose, interpreting and using results  Needs Review    Patient understands prevention, detection, and treatment of acute complications.  Needs Review    Patient understands prevention, detection, and treatment of chronic complications.  Needs Review    Patient understands how to develop strategies to address psychosocial issues.  Needs Review    Patient understands how to develop strategies to promote health/change behavior.  Needs Review      Complications   Last HgB A1C per patient/outside source  8.7 %    How often do you check your blood sugar?  1-2 times/day    Fasting Blood glucose range (mg/dL)  70-129;130-179;180-200;>200    Postprandial Blood glucose range (mg/dL)  70-129;130-179;180-200;>200    Number of hypoglycemic episodes per month  4    Can you tell when your blood sugar is low?  Yes    What do you do if your blood sugar is low?  food, juice    Have you had a dilated eye exam in the past 12 months?  Yes    Have you had a dental exam in the past 12 months?  No    Are you checking your feet?  Yes    How many days per week are you checking your feet?  6      Dietary Intake   Breakfast  7 AM: steak biscuit    Snack (morning)  10:15 bowl of cereal (mixed sweet and unsweet cereal) with small amount of milk    Lunch  2 PM: PNB and banana or tomato sandwich OR fresh fruit    Dinner  5 PM: salad with grilled chicken OR roast beef sandwich and jalopeno poppers OR chicken tenders occasionally with fries or mac and cheese    Snack (evening)  ice cream (Carb Smart bars x 2) OR cereal     Beverage(s)  coffee with cream, water, diet soda diluted      Exercise   Exercise Type  ADL's   physically active all  day, not much sitting due to restless leg syndrome   How many days per week to you exercise?  7    How many minutes per day do you exercise?  60    Total minutes per week of exercise  420      Patient Education   Previous Diabetes Education  No    Disease state   Factors that contribute to the development of diabetes;Definition of diabetes, type 1 and 2, and the diagnosis of diabetes    Nutrition management   Role of diet in the treatment of diabetes and the relationship between the three main macronutrients and blood glucose level;Food label reading, portion sizes and measuring food.;Carbohydrate counting;Reviewed blood glucose goals for pre and post meals and how to evaluate the patients' food intake on their blood glucose level.    Physical activity and exercise   Role of exercise on diabetes management, blood pressure control and cardiac health.    Medications  Reviewed patients medication for diabetes, action, purpose, timing of dose and side effects.    Monitoring  Identified appropriate SMBG and/or A1C goals.      Individualized Goals (developed by patient)   Nutrition  Follow meal plan discussed    Physical Activity  Exercise 5-7 days per week    Medications  take my medication as prescribed    Monitoring   test blood glucose pre and post meals as discussed;Other (comment)   test before and after exercise to see if need food before exercising     Post-Education Assessment   Patient understands the diabetes disease and treatment process.  Demonstrates understanding / competency    Patient understands incorporating nutritional management into lifestyle.  Demonstrates understanding / competency    Patient undertands incorporating physical activity into lifestyle.  Demonstrates understanding / competency    Patient understands using medications safely.  Demonstrates understanding / competency    Patient understands monitoring blood glucose, interpreting and using results  Demonstrates  understanding / competency    Patient understands prevention, detection, and treatment of acute complications.  Demonstrates understanding / competency    Patient understands prevention, detection, and treatment of chronic complications.  Demonstrates understanding / competency    Patient understands how to develop strategies to address psychosocial issues.  Demonstrates understanding / competency      Outcomes   Expected Outcomes  Demonstrated interest in learning. Expect positive outcomes    Future DMSE  PRN    Program Status  Completed       Individualized  Plan for Diabetes Self-Management Training:   Learning Objective:  Patient will have a greater understanding of diabetes self-management. Patient education plan is to attend individual and/or group sessions per assessed needs and concerns.   Plan:   Patient Instructions  Plan:  Aim for 3 Carb Choices per meal (45 grams) +/- 1 either way  Aim for 0-2 Carbs per snack if hungry  Include protein in moderation with your meals and snacks Continue reading food labels for Total Carbohydrate  of foods Continue with your activity level daily as tolerated Continue checking BG at alternate times per day  Continue taking medication as directed by MD I've informed you of the [place] sensor product to look into  Expected Outcomes:  Demonstrated interest in learning. Expect positive outcomes  Education material provided: ADA Diabetes: Your Take Control Guide, Food label handouts, A1C conversion sheet, Meal plan card and Carbohydrate counting sheet, [employer], Diabetes Support Group, Insulin Action handout, Diabetes Medication handout  If problems or questions, patient to contact team via:  Phone  Future DSME appointment: PRN

## 2017-12-18 DIAGNOSIS — I209 Angina pectoris, unspecified: Secondary | ICD-10-CM | POA: Diagnosis not present

## 2017-12-18 DIAGNOSIS — I2510 Atherosclerotic heart disease of native coronary artery without angina pectoris: Secondary | ICD-10-CM | POA: Diagnosis not present

## 2017-12-18 DIAGNOSIS — E782 Mixed hyperlipidemia: Secondary | ICD-10-CM | POA: Diagnosis not present

## 2017-12-18 DIAGNOSIS — Z9861 Coronary angioplasty status: Secondary | ICD-10-CM | POA: Diagnosis not present

## 2017-12-22 ENCOUNTER — Telehealth: Payer: Self-pay | Admitting: *Deleted

## 2017-12-22 NOTE — Telephone Encounter (Signed)
Updated med list

## 2018-01-19 DIAGNOSIS — G4733 Obstructive sleep apnea (adult) (pediatric): Secondary | ICD-10-CM | POA: Diagnosis not present

## 2018-01-19 DIAGNOSIS — G4719 Other hypersomnia: Secondary | ICD-10-CM | POA: Diagnosis not present

## 2018-01-19 DIAGNOSIS — R063 Periodic breathing: Secondary | ICD-10-CM | POA: Diagnosis not present

## 2018-01-19 DIAGNOSIS — R0681 Apnea, not elsewhere classified: Secondary | ICD-10-CM | POA: Diagnosis not present

## 2018-01-19 DIAGNOSIS — R0683 Snoring: Secondary | ICD-10-CM | POA: Diagnosis not present

## 2018-01-19 DIAGNOSIS — R5383 Other fatigue: Secondary | ICD-10-CM | POA: Diagnosis not present

## 2018-01-26 DIAGNOSIS — M25511 Pain in right shoulder: Secondary | ICD-10-CM | POA: Diagnosis not present

## 2018-01-26 DIAGNOSIS — M542 Cervicalgia: Secondary | ICD-10-CM | POA: Diagnosis not present

## 2018-02-04 ENCOUNTER — Other Ambulatory Visit: Payer: Self-pay

## 2018-02-04 DIAGNOSIS — I25118 Atherosclerotic heart disease of native coronary artery with other forms of angina pectoris: Secondary | ICD-10-CM

## 2018-02-04 DIAGNOSIS — E1159 Type 2 diabetes mellitus with other circulatory complications: Secondary | ICD-10-CM

## 2018-02-04 DIAGNOSIS — I201 Angina pectoris with documented spasm: Secondary | ICD-10-CM

## 2018-02-04 DIAGNOSIS — Z794 Long term (current) use of insulin: Secondary | ICD-10-CM

## 2018-02-04 DIAGNOSIS — IMO0002 Reserved for concepts with insufficient information to code with codable children: Secondary | ICD-10-CM

## 2018-02-04 DIAGNOSIS — E1165 Type 2 diabetes mellitus with hyperglycemia: Secondary | ICD-10-CM

## 2018-02-04 DIAGNOSIS — E1151 Type 2 diabetes mellitus with diabetic peripheral angiopathy without gangrene: Secondary | ICD-10-CM

## 2018-02-05 ENCOUNTER — Other Ambulatory Visit: Payer: Medicare Other

## 2018-02-05 DIAGNOSIS — I25118 Atherosclerotic heart disease of native coronary artery with other forms of angina pectoris: Secondary | ICD-10-CM | POA: Diagnosis not present

## 2018-02-05 DIAGNOSIS — E1159 Type 2 diabetes mellitus with other circulatory complications: Secondary | ICD-10-CM | POA: Diagnosis not present

## 2018-02-05 DIAGNOSIS — Z794 Long term (current) use of insulin: Secondary | ICD-10-CM | POA: Diagnosis not present

## 2018-02-05 DIAGNOSIS — I201 Angina pectoris with documented spasm: Secondary | ICD-10-CM | POA: Diagnosis not present

## 2018-02-05 DIAGNOSIS — E1165 Type 2 diabetes mellitus with hyperglycemia: Secondary | ICD-10-CM | POA: Diagnosis not present

## 2018-02-05 DIAGNOSIS — E1151 Type 2 diabetes mellitus with diabetic peripheral angiopathy without gangrene: Secondary | ICD-10-CM | POA: Diagnosis not present

## 2018-02-06 ENCOUNTER — Other Ambulatory Visit: Payer: Self-pay | Admitting: Internal Medicine

## 2018-02-06 LAB — COMPLETE METABOLIC PANEL WITH GFR
AG Ratio: 1.8 (calc) (ref 1.0–2.5)
ALT: 33 U/L (ref 9–46)
AST: 21 U/L (ref 10–35)
Albumin: 4.4 g/dL (ref 3.6–5.1)
Alkaline phosphatase (APISO): 52 U/L (ref 40–115)
BUN: 21 mg/dL (ref 7–25)
CO2: 32 mmol/L (ref 20–32)
Calcium: 9.7 mg/dL (ref 8.6–10.3)
Chloride: 103 mmol/L (ref 98–110)
Creat: 1.15 mg/dL (ref 0.70–1.25)
GFR, Est African American: 75 mL/min/{1.73_m2} (ref >=60)
GFR, Est Non African American: 65 mL/min/{1.73_m2} (ref >=60)
Globulin: 2.5 g/dL (calc) (ref 1.9–3.7)
Glucose, Bld: 177 mg/dL — ABNORMAL HIGH (ref 65–99)
Potassium: 4.7 mmol/L (ref 3.5–5.3)
Sodium: 138 mmol/L (ref 135–146)
Total Bilirubin: 0.7 mg/dL (ref 0.2–1.2)
Total Protein: 6.9 g/dL (ref 6.1–8.1)

## 2018-02-06 LAB — LIPID PANEL
Cholesterol: 97 mg/dL (ref <200)
HDL: 47 mg/dL (ref >40)
LDL Cholesterol (Calc): 31 mg/dL (calc)
Non-HDL Cholesterol (Calc): 50 mg/dL (calc) (ref <130)
Total CHOL/HDL Ratio: 2.1 (calc) (ref <5.0)
Triglycerides: 109 mg/dL (ref <150)

## 2018-02-06 LAB — HEMOGLOBIN A1C
Hgb A1c MFr Bld: 9.1 % of total Hgb — ABNORMAL HIGH (ref <5.7)
Mean Plasma Glucose: 214 (calc)
eAG (mmol/L): 11.9 (calc)

## 2018-02-09 ENCOUNTER — Other Ambulatory Visit: Payer: Medicare Other

## 2018-02-12 ENCOUNTER — Ambulatory Visit (INDEPENDENT_AMBULATORY_CARE_PROVIDER_SITE_OTHER): Payer: Medicare Other | Admitting: Nurse Practitioner

## 2018-02-12 ENCOUNTER — Encounter: Payer: Self-pay | Admitting: Nurse Practitioner

## 2018-02-12 ENCOUNTER — Ambulatory Visit: Payer: Medicare Other | Admitting: Internal Medicine

## 2018-02-12 VITALS — BP 128/78 | HR 72 | Temp 98.2°F | Ht 68.0 in | Wt 182.4 lb

## 2018-02-12 DIAGNOSIS — E1165 Type 2 diabetes mellitus with hyperglycemia: Secondary | ICD-10-CM

## 2018-02-12 DIAGNOSIS — E1159 Type 2 diabetes mellitus with other circulatory complications: Secondary | ICD-10-CM

## 2018-02-12 DIAGNOSIS — IMO0002 Reserved for concepts with insufficient information to code with codable children: Secondary | ICD-10-CM

## 2018-02-12 DIAGNOSIS — I10 Essential (primary) hypertension: Secondary | ICD-10-CM

## 2018-02-12 DIAGNOSIS — I201 Angina pectoris with documented spasm: Secondary | ICD-10-CM

## 2018-02-12 DIAGNOSIS — G2581 Restless legs syndrome: Secondary | ICD-10-CM | POA: Diagnosis not present

## 2018-02-12 DIAGNOSIS — E785 Hyperlipidemia, unspecified: Secondary | ICD-10-CM

## 2018-02-12 DIAGNOSIS — K219 Gastro-esophageal reflux disease without esophagitis: Secondary | ICD-10-CM

## 2018-02-12 DIAGNOSIS — I25118 Atherosclerotic heart disease of native coronary artery with other forms of angina pectoris: Secondary | ICD-10-CM

## 2018-02-12 DIAGNOSIS — Z794 Long term (current) use of insulin: Secondary | ICD-10-CM | POA: Diagnosis not present

## 2018-02-12 MED ORDER — DULAGLUTIDE 0.75 MG/0.5ML ~~LOC~~ SOAJ: 1.0000 | SUBCUTANEOUS | 3 refills | Status: DC

## 2018-02-12 MED ORDER — PRAMIPEXOLE DIHYDROCHLORIDE 1 MG PO TABS: 2.0000 mg | ORAL_TABLET | Freq: Every day | ORAL | 1 refills | Status: DC

## 2018-02-12 MED ORDER — PANTOPRAZOLE SODIUM 40 MG PO TBEC: DELAYED_RELEASE_TABLET | ORAL | 3 refills | Status: DC

## 2018-02-12 NOTE — Patient Instructions (Addendum)
STOP amaryl  Start trulicity 0.75/0.5 Ml injection weekly for diabetes  Record blood sugars twice daily and bring to follow up in 3 weeks  Also write down when you take your insulin and the amount that you take   Important to Make dietary modifications

## 2018-02-12 NOTE — Progress Notes (Signed)
Careteam: Patient Care Team: Kermit Balo, DO as PCP - General (Geriatric Medicine) [name], MD as Consulting Physician (Cardiology) Van Clines, MD as Consulting Physician (Neurology) [name], [name], OD (Optometry) [name], MD as Consulting Physician (Otolaryngology)  Advanced Directive information Does Patient Have a Medical Advance Directive?: No  Allergies  Allergen Reactions  . Lyrica [Pregabalin] Other (See Comments)    Made patient very lethargic the next morning, very hard to patient to function, move, etc.   . Statins Other (See Comments)    Causes Restless Legs, rhabdomyolysis     Chief Complaint  Patient presents with  . Medical Management of Chronic Issues    Pt is being seen for a 4 month routine visit. labs were printed for discussion     HPI: Patient is a 67 y.o. male seen in the office today routine follow up.   DM- self adjusted insulin and does not take glimepiride every day. A1c 9.1!! He went to a nutritionist after last office visit.  Hard for him to eat due to being on the job, will go several hours without eating and then eat bad.  Reports his son eats very healthy, did not take any insulin when he was there.  Did not bring blood sugar readings in.  Unable to use metformin due to GI upset.  -has neuropathy   Hyperlipidemia- LDL at goal on alirocumab, getting this through cardiologist.   RLS- following with neurologist, not controlled. Taking mirapex and gabapentin- he is self adjusting this. Had to go down on gabapentin because he could not tolerate. Reports they were trying to titrate mirapex down but it works. In the mornings he is fine and then in 10 mins of sitting he is shaking, in the evening he will take 1/2 tablet if he feels the restless leg coming on then at bedtime he takes routinely he is taking mirapex 1 mg and gabapentin 100 mg daily.  CAD- following with Dr [name], on ASA 81 mg daily. No current chest pains.   HTN-  coreg 6.25 mg by mouth twice daily   GERD- using protonix 40 mg by mouth twice daily. - not sure why he is taking it twice dialy   Does not get flu vaccine.  Review of Systems:  Review of Systems  Constitutional: Negative for chills and fever.  HENT: Negative for congestion.   Eyes: Negative for blurred vision.  Respiratory: Negative for cough and shortness of breath.   Cardiovascular: Negative for chest pain, palpitations and leg swelling.  Gastrointestinal: Negative for abdominal pain, blood in stool, constipation and melena.  Genitourinary: Negative for dysuria.  Musculoskeletal: Negative for falls and joint pain.  Skin: Negative for itching and rash.  Neurological: Positive for tingling, tremors and sensory change. Negative for dizziness and loss of consciousness.       Restless legs  Endo/Heme/Allergies: Does not bruise/bleed easily.  Psychiatric/Behavioral: Negative for depression and memory loss. The patient is not nervous/anxious and does not have insomnia.     Past Medical History:  Diagnosis Date  . Arthritis    "left thumb" (05/23/2017)  . Barrett's esophagus    "we've been told that it's all gone; still take RX for GERD" (05/23/2017)  . CAD (coronary artery disease), native coronary artery    Coronary angiogram 05/03/2014:  Proximal LAD 3.0x12 mm Promus premier stent.  04/08/2014: Mid Cx 3.5 x 16 mm Promus DES.  03/29/2013: Mid LAD 2.75 x 38 mm Promus Premier drug-eluting stent, balloon  angioplasty of D1 and distal LAD and stenting of distal RCA with 2.75 x 24 mm promos Premier drug-eluting stent 12/15/2012 widely patent.  . Cervicalgia   . Chronic bronchitis (HCC)    "q yr" (05/23/2017)  . Complication of anesthesia    " I do not wake up very well "  . Family history of adverse reaction to anesthesia    "son w/PONV"  . GERD (gastroesophageal reflux disease)   . Heart murmur    "grew out of it"   . Hyperlipidemia   . Hypoglycemia, unspecified   . IDDM (insulin dependent  diabetes mellitus) (HCC)   . Impotence of organic origin   . Iron deficiency anemia   . Other malaise and fatigue   . PONV (postoperative nausea and vomiting)   . Restless leg   . Tension headache    "sometimes" (05/23/2017)  . Unspecified gastritis and gastroduodenitis with hemorrhage   . Unstable angina pectoris (HCC) 04/08/2014   Coronary angiogram 05/03/2014:  Proximal LAD 3.0x12 mm Promus premier stent.  04/08/2014: Mid Cx 3.5 x 16 mm Promus DES.  03/29/2013: Mid LAD 2.75 x 38 mm Promus Premier drug-eluting stent, balloon angioplasty of D1 and distal LAD and stenting of distal RCA with 2.75 x 24 mm promos Premier drug-eluting stent 12/15/2012 widely patent.   Past Surgical History:  Procedure Laterality Date  . CARDIAC CATHETERIZATION N/A 09/25/2015   Procedure: Left Heart Cath and Coronary Angiography;  Surgeon: Yates DecampJay Ganji, MD;  [facility];  Service: Cardiovascular;  Laterality: N/A;  . CARDIAC CATHETERIZATION N/A 09/25/2015   Procedure: Intravascular Pressure Wire/FFR Study;  Surgeon: Yates DecampJay Ganji, MD;  [facility];  Service: Cardiovascular;  Laterality: N/A;  . CORONARY ANGIOPLASTY WITH STENT PLACEMENT  12/15/2012; 04/28/2014; 05/03/2014   "2; 1; 1"   . FRACTIONAL FLOW RESERVE WIRE Right 03/26/2013   Procedure: FRACTIONAL FLOW RESERVE WIRE;  Surgeon: Pamella PertJagadeesh R Ganji, MD;  [facility];  Service: Cardiovascular;  Laterality: Right;  . FRACTIONAL FLOW RESERVE WIRE N/A 05/03/2014   Procedure: FRACTIONAL FLOW RESERVE WIRE;  Surgeon: Pamella PertJagadeesh R Ganji, MD;  [facility];  Service: Cardiovascular;  Laterality: N/A;  . HERNIA REPAIR  2010   "umbilical"   . INCISION AND DRAINAGE ABSCESS Left 05/2007   "groin"   . KNEE SURGERY Right 1992;  2000   "calcium deposits removed" (12/15/2012)  . LEFT HEART CATH AND CORONARY ANGIOGRAPHY N/A 05/23/2017   Procedure: LEFT HEART CATH AND CORONARY ANGIOGRAPHY;  Surgeon: Yates DecampGanji, Jay, MD;  Location: [facility] CV LAB;   Service: Cardiovascular;  Laterality: N/A;  . LEFT HEART CATHETERIZATION WITH CORONARY ANGIOGRAM N/A 12/15/2012   Procedure: LEFT HEART CATHETERIZATION WITH CORONARY ANGIOGRAM;  Surgeon: Pamella PertJagadeesh R Ganji, MD;  [facility];  Service: Cardiovascular;  Laterality: N/A;  . LEFT HEART CATHETERIZATION WITH CORONARY ANGIOGRAM N/A 03/26/2013   Procedure: LEFT HEART CATHETERIZATION WITH CORONARY ANGIOGRAM;  Surgeon: Pamella PertJagadeesh R Ganji, MD;  [facility];  Service: Cardiovascular;  Laterality: N/A;  . LEFT HEART CATHETERIZATION WITH CORONARY ANGIOGRAM N/A 04/08/2014   Procedure: LEFT HEART CATHETERIZATION WITH CORONARY ANGIOGRAM;  Surgeon: Micheline ChapmanMichael D Cooper, MD;  [facility];  Service: Cardiovascular;  Laterality: N/A;  . PERCUTANEOUS CORONARY STENT INTERVENTION (PCI-S) N/A 05/03/2014   Procedure: PERCUTANEOUS CORONARY STENT INTERVENTION (PCI-S);  Surgeon: Pamella PertJagadeesh R Ganji, MD;  [facility];  Service: Cardiovascular;  Laterality: N/A;  . UMBILICAL HERNIA REPAIR  Social History:   reports that he has never smoked. He has never used smokeless tobacco. He reports that he does not drink alcohol or use drugs.  Family History  Adopted: Yes  Family history unknown: Yes    Medications: Patient's Medications  New Prescriptions   No medications on file  Previous Medications   ACETAMINOPHEN (TYLENOL) 500 MG TABLET    Take 1,000 mg by mouth every 6 (six) hours as needed for headache.    ALBUTEROL (PROVENTIL HFA;VENTOLIN HFA) 108 (90 BASE) MCG/ACT INHALER    Inhale 2 puffs into the lungs every 6 (six) hours as needed for wheezing or shortness of breath.   ALIROCUMAB (PRALUENT) 75 MG/ML SOPN    Inject 75 mg into the skin every 14 (fourteen) days.    ASPIRIN 81 MG EC TABLET    Take 1 tablet (81 mg total) by mouth daily.   CARVEDILOL (COREG) 6.25 MG TABLET    Take 1/2 tablet by mouth twice daily with meals   FLUTICASONE (FLONASE) 50 MCG/ACT NASAL SPRAY    Place 1 spray into  both nostrils daily as needed for allergies or rhinitis.   GABAPENTIN (NEURONTIN) 100 MG CAPSULE    Take 200 mg by mouth at bedtime.    GLIMEPIRIDE (AMARYL) 1 MG TABLET    Take 1 mg by mouth daily with breakfast.   INSULIN GLARGINE (BASAGLAR KWIKPEN) 100 UNIT/ML SOPN    INJECT 40 UNITS SUBCUTANEOUSLY INTO THE SKIN AT BEDTIME   INSULIN PEN NEEDLE (PEN NEEDLES) 32G X 4 MM MISC    at bedtime.   NITROGLYCERIN (NITROSTAT) 0.4 MG SL TABLET    PLACE 1 TABLET (0.4 MG TOTAL) UNDER THE TONGUE EVERY 5 (FIVE) MINUTES AS NEEDED FOR CHEST PAIN.   PANTOPRAZOLE (PROTONIX) 40 MG TABLET    Take one tablet by mouth twice daily for stomach   PRAMIPEXOLE (MIRAPEX) 0.25 MG TABLET    TAKE 3 TABS AT BEDTIME X2 WEEKS, THEN 2 TABS AT BEDTIME ( TAKE THIS ALONG WITH 1 MG TAB)   PRAMIPEXOLE (MIRAPEX) 1 MG TABLET    Take 2 mg by mouth at bedtime.   VALSARTAN (DIOVAN) 80 MG TABLET    Take 80 mg by mouth daily.  Modified Medications   No medications on file  Discontinued Medications   No medications on file     Physical Exam:  Vitals:   02/12/18 1255  BP: 128/78  Pulse: 72  Temp: 98.2 F (36.8 C)  TempSrc: Oral  SpO2: 96%  Weight: 182 lb 6.4 oz (82.7 kg)  Height: 5\' 8"  (1.727 m)   Body mass index is 27.73 kg/m.  Physical Exam Constitutional:      General: He is not in acute distress.    Appearance: He is well-developed.  HENT:     Head: Normocephalic and atraumatic.  Cardiovascular:     Rate and Rhythm: Normal rate and regular rhythm.     Heart sounds: Normal heart sounds.  Pulmonary:     Effort: Pulmonary effort is normal. No respiratory distress.     Breath sounds: Normal breath sounds.  Abdominal:     General: Bowel sounds are normal.  Musculoskeletal: Normal range of motion.  Skin:    General: Skin is warm and dry.  Neurological:     Mental Status: He is alert and oriented to person, place, and time.     Comments: tremor     Labs reviewed: Basic Metabolic Panel: Recent Labs     05/23/17 0955 05/23/17  1035 10/07/17 0840 02/05/18 0834  NA 133* 138 137 138  K 4.2 4.1 4.5 4.7  CL 100* 99* 103 103  CO2 22  --  27 32  GLUCOSE 245* 235* 202* 177*  BUN 13 16 19 21   CREATININE 1.09 0.90 1.07 1.15  CALCIUM 8.8*  --  9.1 9.7   Liver Function Tests: Recent Labs    05/23/17 0955 10/07/17 0840 02/05/18 0834  AST 23 18 21   ALT 25 23 33  ALKPHOS 53  --   --   BILITOT 0.6 0.7 0.7  PROT 6.8 6.7 6.9  ALBUMIN 3.9  --   --    No results for input(s): LIPASE, AMYLASE in the last 8760 hours. No results for input(s): AMMONIA in the last 8760 hours. CBC: Recent Labs    04/25/17 0920 05/23/17 0955 05/23/17 1035 10/07/17 0840  WBC 4.9 5.0  --  4.5  NEUTROABS 2,940  --   --  2,777  HGB 13.8 14.0 13.3 14.5  HCT 41.3 40.3 39.0 43.0  MCV 80.4 82.9  --  84.5  PLT 142 121*  --  114*   Lipid Panel: Recent Labs    10/07/17 0840 02/05/18 0834  CHOL 167 97  HDL 36* 47  LDLCALC 106* 31  TRIG 139 109  CHOLHDL 4.6 2.1   TSH: No results for input(s): TSH in the last 8760 hours. A1C: Lab Results  Component Value Date   HGBA1C 9.1 (H) 02/05/2018     Assessment/Plan 1. Coronary artery disease involving native coronary artery of native heart with other form of angina pectoris (HCC) Stable without chest pains, continues on ASA 81 mg daily   2. Restless legs syndrome (RLS) Having a hard time controlling symptoms but currently taking mirapex 1/2 tablet in the evening and 1 tablet at bedtime with 100 mg of gabapentin.  - pramipexole (MIRAPEX) 1 MG tablet; Take 2 tablets (2 mg total) by mouth at bedtime.  Dispense: 60 tablet; Refill: 1  3. Essential hypertension Stable on corege 6.25 mg BID  4. Hyperlipidemia, unspecified hyperlipidemia type Controlled with alirocumab every 14 days   5. Uncontrolled diabetes mellitus with circulatory complication, with long-term current use of insulin (HCC) -A1c remains to elevate at 9.1, pt reports diet is a major issue however  not taking medication consisently due to fear of hypoglycemia. To stop glimepiride at this time (due to not taking consistently)  - Dulaglutide (TRULICITY) 0.75 MG/0.5ML SOPN; Inject 1 Dose into the skin once a week.  Dispense: 4 pen; Refill: 3 -continue basaglar 40 units at bedtime. -to follow up in 3 weeks with blood sugar readings and the amount of insulin he has taken   6. Gastroesophageal reflux disease without esophagitis Stable on protonix, will reduce to daily at this time.  Next appt: 3 weeks.  Janene Harvey. Biagio Borg  [facility] [phone]

## 2018-02-19 ENCOUNTER — Telehealth: Payer: Self-pay | Admitting: *Deleted

## 2018-02-19 NOTE — Telephone Encounter (Signed)
Patient called and left message on Clinical intake stating he needed to speak with Shanda BumpsJessica.   Tried calling patient back and LMOM to return call.

## 2018-02-19 NOTE — Telephone Encounter (Signed)
Patient called back and stated that Shanda BumpsJessica put patient on Trulicity and he took his first shot last Thursday and got sick. Abdominal pain, Severe heart burn and Nausea. Doesn't want to take this medication due to the side effects. Wants changed to something different. Please Advise.

## 2018-02-19 NOTE — Telephone Encounter (Signed)
Patient stated that he will give it another try.

## 2018-02-19 NOTE — Telephone Encounter (Signed)
These side effects generally subside after a few doses, encourage to continue medication to optimal diabetic control.

## 2018-03-02 ENCOUNTER — Encounter: Payer: Self-pay | Admitting: Nurse Practitioner

## 2018-03-02 ENCOUNTER — Ambulatory Visit (INDEPENDENT_AMBULATORY_CARE_PROVIDER_SITE_OTHER): Payer: Medicare Other | Admitting: Nurse Practitioner

## 2018-03-02 VITALS — BP 132/80 | HR 60 | Ht 68.0 in | Wt 184.4 lb

## 2018-03-02 DIAGNOSIS — I201 Angina pectoris with documented spasm: Secondary | ICD-10-CM

## 2018-03-02 DIAGNOSIS — M25511 Pain in right shoulder: Secondary | ICD-10-CM | POA: Diagnosis not present

## 2018-03-02 DIAGNOSIS — E1165 Type 2 diabetes mellitus with hyperglycemia: Secondary | ICD-10-CM | POA: Diagnosis not present

## 2018-03-02 DIAGNOSIS — Z794 Long term (current) use of insulin: Secondary | ICD-10-CM

## 2018-03-02 DIAGNOSIS — E1159 Type 2 diabetes mellitus with other circulatory complications: Secondary | ICD-10-CM

## 2018-03-02 DIAGNOSIS — IMO0002 Reserved for concepts with insufficient information to code with codable children: Secondary | ICD-10-CM

## 2018-03-02 NOTE — Patient Instructions (Signed)
Try to maintain diet changes If you are not able to and blood sugars start to elevate make appt to be seen sooner than 3 months.

## 2018-03-02 NOTE — Progress Notes (Signed)
Careteam: Patient Care Team: Kermit Balo, DO as PCP - General (Geriatric Medicine) [name], MD as Consulting Physician (Cardiology) Van Clines, MD as Consulting Physician (Neurology) [name], [name], OD (Optometry) [name], MD as Consulting Physician (Otolaryngology)  Advanced Directive information Does Patient Have a Medical Advance Directive?: No, Would patient like information on creating a medical advance directive?: No - Patient declined  Allergies  Allergen Reactions  . Lyrica [Pregabalin] Other (See Comments)    Made patient very lethargic the next morning, very hard to patient to function, move, etc.   . Statins Other (See Comments)    Causes Restless Legs, rhabdomyolysis    Chief Complaint  Patient presents with  . Medical Management of Chronic Issues    3 week follow up with blood sugar      HPI: Patient is a 67 y.o. male seen in the office today for blood sugar follow up.  At last OV A1c noted to be 9.1  Took trulicity however had bad side effects of severe heart burn, abdominal pain and nausea and decided not to take any more medication- lasted 3 days and then resolved.  Brings blood sugar log today. Stopped glipizide and now only taking basaglar Fasting blood sugars- 101,98,109,101,64,151,104,99,64,138 Taking 40 units at night.  Reports he has to maintain his diet, trying to minimize carbohydrates and use complex carbohydrates.     Review of Systems:  Review of Systems  Constitutional: Negative for chills and fever.  HENT: Negative for congestion.   Eyes: Negative for blurred vision.  Respiratory: Negative for cough and shortness of breath.   Cardiovascular: Negative for chest pain, palpitations and leg swelling.  Gastrointestinal: Negative for abdominal pain, blood in stool, constipation and melena.  Genitourinary: Negative for dysuria.  Musculoskeletal: Negative for falls and joint pain.  Skin: Negative for itching and rash.    Neurological: Positive for tingling, tremors and sensory change. Negative for dizziness and loss of consciousness.       Restless legs  Endo/Heme/Allergies: Does not bruise/bleed easily.  Psychiatric/Behavioral: Negative for depression and memory loss. The patient is not nervous/anxious and does not have insomnia.     Past Medical History:  Diagnosis Date  . Arthritis    "left thumb" (05/23/2017)  . Barrett's esophagus    "we've been told that it's all gone; still take RX for GERD" (05/23/2017)  . CAD (coronary artery disease), native coronary artery    Coronary angiogram 05/03/2014:  Proximal LAD 3.0x12 mm Promus premier stent.  04/08/2014: Mid Cx 3.5 x 16 mm Promus DES.  03/29/2013: Mid LAD 2.75 x 38 mm Promus Premier drug-eluting stent, balloon angioplasty of D1 and distal LAD and stenting of distal RCA with 2.75 x 24 mm promos Premier drug-eluting stent 12/15/2012 widely patent.  . Cervicalgia   . Chronic bronchitis (HCC)    "q yr" (05/23/2017)  . Complication of anesthesia    " I do not wake up very well "  . Family history of adverse reaction to anesthesia    "son w/PONV"  . GERD (gastroesophageal reflux disease)   . Heart murmur    "grew out of it"   . Hyperlipidemia   . Hypoglycemia, unspecified   . IDDM (insulin dependent diabetes mellitus) (HCC)   . Impotence of organic origin   . Iron deficiency anemia   . Other malaise and fatigue   . PONV (postoperative nausea and vomiting)   . Restless leg   . Tension headache    "  sometimes" (05/23/2017)  . Unspecified gastritis and gastroduodenitis with hemorrhage   . Unstable angina pectoris (HCC) 04/08/2014   Coronary angiogram 05/03/2014:  Proximal LAD 3.0x12 mm Promus premier stent.  04/08/2014: Mid Cx 3.5 x 16 mm Promus DES.  03/29/2013: Mid LAD 2.75 x 38 mm Promus Premier drug-eluting stent, balloon angioplasty of D1 and distal LAD and stenting of distal RCA with 2.75 x 24 mm promos Premier drug-eluting stent 12/15/2012 widely patent.    Past Surgical History:  Procedure Laterality Date  . CARDIAC CATHETERIZATION N/A 09/25/2015   Procedure: Left Heart Cath and Coronary Angiography;  Surgeon: [name], MD;  Location: [facility] INVASIVE CV LAB;  Service: Cardiovascular;  Laterality: N/A;  . CARDIAC CATHETERIZATION N/A 09/25/2015   Procedure: Intravascular Pressure Wire/FFR Study;  Surgeon: [name], MD;  Location: [facility] INVASIVE CV LAB;  Service: Cardiovascular;  Laterality: N/A;  . CORONARY ANGIOPLASTY WITH STENT PLACEMENT  12/15/2012; 04/28/2014; 05/03/2014   "2; 1; 1"   . FRACTIONAL FLOW RESERVE WIRE Right 03/26/2013   Procedure: FRACTIONAL FLOW RESERVE WIRE;  Surgeon: Pamella Pert, MD;  Location: [facility] CATH LAB;  Service: Cardiovascular;  Laterality: Right;  . FRACTIONAL FLOW RESERVE WIRE N/A 05/03/2014   Procedure: FRACTIONAL FLOW RESERVE WIRE;  Surgeon: Pamella Pert, MD;  Location: [facility] CATH LAB;  Service: Cardiovascular;  Laterality: N/A;  . HERNIA REPAIR  2010   "umbilical"   . INCISION AND DRAINAGE ABSCESS Left 05/2007   "groin"   . KNEE SURGERY Right 1992;  2000   "calcium deposits removed" (12/15/2012)  . LEFT HEART CATH AND CORONARY ANGIOGRAPHY N/A 05/23/2017   Procedure: LEFT HEART CATH AND CORONARY ANGIOGRAPHY;  Surgeon: [name], MD;  Location: [facility] INVASIVE CV LAB;  Service: Cardiovascular;  Laterality: N/A;  . LEFT HEART CATHETERIZATION WITH CORONARY ANGIOGRAM N/A 12/15/2012   Procedure: LEFT HEART CATHETERIZATION WITH CORONARY ANGIOGRAM;  Surgeon: Pamella Pert, MD;  Location: [facility] CATH LAB;  Service: Cardiovascular;  Laterality: N/A;  . LEFT HEART CATHETERIZATION WITH CORONARY ANGIOGRAM N/A 03/26/2013   Procedure: LEFT HEART CATHETERIZATION WITH CORONARY ANGIOGRAM;  Surgeon: Pamella Pert, MD;  Location: [facility] CATH LAB;  Service: Cardiovascular;  Laterality: N/A;  . LEFT HEART CATHETERIZATION WITH CORONARY ANGIOGRAM N/A 04/08/2014   Procedure: LEFT HEART CATHETERIZATION WITH CORONARY ANGIOGRAM;  Surgeon: Micheline Chapman, MD;  Location: [facility] CATH LAB;  Service: Cardiovascular;  Laterality: N/A;  . PERCUTANEOUS CORONARY STENT INTERVENTION (PCI-S) N/A 05/03/2014   Procedure: PERCUTANEOUS CORONARY STENT INTERVENTION (PCI-S);  Surgeon: Pamella Pert, MD;  Location: [facility] CATH LAB;  Service: Cardiovascular;  Laterality: N/A;  . UMBILICAL HERNIA REPAIR     Social History:   reports that he has never smoked. He has never used smokeless tobacco. He reports that he does not drink alcohol or use drugs.  Family History  Adopted: Yes  Family history unknown: Yes    Medications: Patient's Medications  New Prescriptions   No medications on file  Previous Medications   ACETAMINOPHEN (TYLENOL) 500 MG TABLET    Take 1,000 mg by mouth every 6 (six) hours as needed for headache.    ALBUTEROL (PROVENTIL HFA;VENTOLIN HFA) 108 (90 BASE) MCG/ACT INHALER    Inhale 2 puffs into the lungs every 6 (six) hours as needed for wheezing or shortness of breath.   ALIROCUMAB (PRALUENT) 75 MG/ML SOPN    Inject 75 mg into the skin every 14 (fourteen) days.    ASPIRIN 81 MG EC TABLET    Take 1 tablet (  81 mg total) by mouth daily.   CARVEDILOL (COREG) 6.25 MG TABLET    Take 1/2 tablet by mouth twice daily with meals   DULAGLUTIDE (TRULICITY) 0.75 MG/0.5ML SOPN    Inject 1 Dose into the skin once a week.   FLUTICASONE (FLONASE) 50 MCG/ACT NASAL SPRAY    Place 1 spray into both nostrils daily as needed for allergies or rhinitis.   GABAPENTIN (NEURONTIN) 100 MG CAPSULE    Take 200 mg by mouth at bedtime.    INSULIN GLARGINE (BASAGLAR KWIKPEN) 100 UNIT/ML SOPN    INJECT 40 UNITS SUBCUTANEOUSLY INTO THE SKIN AT BEDTIME   INSULIN PEN NEEDLE (PEN NEEDLES) 32G X 4 MM MISC    at bedtime.   NITROGLYCERIN (NITROSTAT) 0.4 MG SL TABLET    PLACE 1 TABLET (0.4 MG TOTAL) UNDER THE TONGUE EVERY 5 (FIVE) MINUTES AS NEEDED FOR CHEST PAIN.   PANTOPRAZOLE (PROTONIX) 40 MG TABLET    Take one tablet by mouth daily   PRAMIPEXOLE (MIRAPEX) 0.25 MG TABLET     TAKE 3 TABS AT BEDTIME X2 WEEKS, THEN 2 TABS AT BEDTIME ( TAKE THIS ALONG WITH 1 MG TAB)   PRAMIPEXOLE (MIRAPEX) 1 MG TABLET    Take 2 tablets (2 mg total) by mouth at bedtime.   VALSARTAN (DIOVAN) 80 MG TABLET    Take 80 mg by mouth daily.  Modified Medications   No medications on file  Discontinued Medications   No medications on file     Physical Exam:  Vitals:   03/02/18 1306  BP: 132/80  Pulse: 60  SpO2: 96%  Weight: 184 lb 6.4 oz (83.6 kg)  Height: 5\' 8"  (1.727 m)   Body mass index is 28.04 kg/m.  Physical Exam Constitutional:      General: He is not in acute distress.    Appearance: He is well-developed.  HENT:     Head: Normocephalic and atraumatic.  Cardiovascular:     Rate and Rhythm: Normal rate and regular rhythm.     Heart sounds: Normal heart sounds.  Pulmonary:     Effort: Pulmonary effort is normal. No respiratory distress.     Breath sounds: Normal breath sounds.  Musculoskeletal: Normal range of motion.  Skin:    General: Skin is warm and dry.  Neurological:     Mental Status: He is alert and oriented to person, place, and time.     Labs reviewed: Basic Metabolic Panel: Recent Labs    05/23/17 0955 05/23/17 1035 10/07/17 0840 02/05/18 0834  NA 133* 138 137 138  K 4.2 4.1 4.5 4.7  CL 100* 99* 103 103  CO2 22  --  27 32  GLUCOSE 245* 235* 202* 177*  BUN 13 16 19 21   CREATININE 1.09 0.90 1.07 1.15  CALCIUM 8.8*  --  9.1 9.7   Liver Function Tests: Recent Labs    05/23/17 0955 10/07/17 0840 02/05/18 0834  AST 23 18 21   ALT 25 23 33  ALKPHOS 53  --   --   BILITOT 0.6 0.7 0.7  PROT 6.8 6.7 6.9  ALBUMIN 3.9  --   --    No results for input(s): LIPASE, AMYLASE in the last 8760 hours. No results for input(s): AMMONIA in the last 8760 hours. CBC: Recent Labs    04/25/17 0920 05/23/17 0955 05/23/17 1035 10/07/17 0840  WBC 4.9 5.0  --  4.5  NEUTROABS 2,940  --   --  2,777  HGB 13.8 14.0 13.3 14.5  HCT 41.3 40.3 39.0 43.0  MCV  80.4 82.9  --  84.5  PLT 142 121*  --  114*   Lipid Panel: Recent Labs    10/07/17 0840 02/05/18 0834  CHOL 167 97  HDL 36* 47  LDLCALC 106* 31  TRIG 139 109  CHOLHDL 4.6 2.1   TSH: No results for input(s): TSH in the last 8760 hours. A1C: Lab Results  Component Value Date   HGBA1C 9.1 (H) 02/05/2018     Assessment/Plan 1. Uncontrolled diabetes mellitus with circulatory complication, with long-term current use of insulin (HCC) -diet modifications have been beneficial with improvement blood sugars. To continue dietary modification with basalgar  -continue to check blood sugars - Hemoglobin A1c; Future - BASIC METABOLIC PANEL WITH GFR; Future  Next appt: 3 months with blood work prior to visit, to follow up sooner if needed (blood sugars becoming elevated) Jaymie Mckiddy K. Biagio BorgEubanks, AGNP  [facility] [phone]

## 2018-03-18 DIAGNOSIS — E119 Type 2 diabetes mellitus without complications: Secondary | ICD-10-CM | POA: Diagnosis not present

## 2018-03-18 DIAGNOSIS — H524 Presbyopia: Secondary | ICD-10-CM | POA: Diagnosis not present

## 2018-03-18 DIAGNOSIS — Z7984 Long term (current) use of oral hypoglycemic drugs: Secondary | ICD-10-CM | POA: Diagnosis not present

## 2018-03-18 DIAGNOSIS — H52223 Regular astigmatism, bilateral: Secondary | ICD-10-CM | POA: Diagnosis not present

## 2018-03-18 DIAGNOSIS — H5203 Hypermetropia, bilateral: Secondary | ICD-10-CM | POA: Diagnosis not present

## 2018-03-18 DIAGNOSIS — Z794 Long term (current) use of insulin: Secondary | ICD-10-CM | POA: Diagnosis not present

## 2018-03-19 LAB — HM DIABETES EYE EXAM

## 2018-03-20 ENCOUNTER — Other Ambulatory Visit: Payer: Self-pay | Admitting: Internal Medicine

## 2018-03-25 ENCOUNTER — Encounter: Payer: Self-pay | Admitting: *Deleted

## 2018-04-10 ENCOUNTER — Other Ambulatory Visit: Payer: Self-pay | Admitting: Nurse Practitioner

## 2018-04-10 DIAGNOSIS — G2581 Restless legs syndrome: Secondary | ICD-10-CM

## 2018-04-20 ENCOUNTER — Emergency Department (HOSPITAL_COMMUNITY)
Admission: EM | Admit: 2018-04-20 | Discharge: 2018-04-20 | Disposition: A | Discharge status: Home / Self Care | Payer: Medicare Other | Attending: Emergency Medicine | Admitting: Emergency Medicine

## 2018-04-20 ENCOUNTER — Emergency Department (HOSPITAL_COMMUNITY): Payer: Medicare Other

## 2018-04-20 ENCOUNTER — Encounter (HOSPITAL_COMMUNITY): Payer: Self-pay

## 2018-04-20 DIAGNOSIS — R112 Nausea with vomiting, unspecified: Secondary | ICD-10-CM | POA: Insufficient documentation

## 2018-04-20 DIAGNOSIS — Z7982 Long term (current) use of aspirin: Secondary | ICD-10-CM | POA: Insufficient documentation

## 2018-04-20 DIAGNOSIS — E119 Type 2 diabetes mellitus without complications: Secondary | ICD-10-CM | POA: Insufficient documentation

## 2018-04-20 DIAGNOSIS — K760 Fatty (change of) liver, not elsewhere classified: Secondary | ICD-10-CM | POA: Diagnosis not present

## 2018-04-20 DIAGNOSIS — I10 Essential (primary) hypertension: Secondary | ICD-10-CM | POA: Diagnosis not present

## 2018-04-20 DIAGNOSIS — I259 Chronic ischemic heart disease, unspecified: Secondary | ICD-10-CM | POA: Diagnosis not present

## 2018-04-20 DIAGNOSIS — Z79899 Other long term (current) drug therapy: Secondary | ICD-10-CM | POA: Diagnosis not present

## 2018-04-20 DIAGNOSIS — R1031 Right lower quadrant pain: Secondary | ICD-10-CM | POA: Diagnosis not present

## 2018-04-20 DIAGNOSIS — R109 Unspecified abdominal pain: Secondary | ICD-10-CM | POA: Diagnosis present

## 2018-04-20 DIAGNOSIS — Z794 Long term (current) use of insulin: Secondary | ICD-10-CM | POA: Insufficient documentation

## 2018-04-20 DIAGNOSIS — R1084 Generalized abdominal pain: Secondary | ICD-10-CM | POA: Diagnosis not present

## 2018-04-20 LAB — CBG MONITORING, ED: Glucose-Capillary: 126 mg/dL — ABNORMAL HIGH (ref 70–99)

## 2018-04-20 LAB — URINALYSIS, ROUTINE W REFLEX MICROSCOPIC
Bilirubin Urine: NEGATIVE
Glucose, UA: NEGATIVE mg/dL
Hgb urine dipstick: NEGATIVE
Ketones, ur: 5 mg/dL — AB
Leukocytes,Ua: NEGATIVE
Nitrite: NEGATIVE
Protein, ur: NEGATIVE mg/dL
Specific Gravity, Urine: 1.018 (ref 1.005–1.030)
pH: 7 (ref 5.0–8.0)

## 2018-04-20 LAB — COMPREHENSIVE METABOLIC PANEL
ALT: 35 U/L (ref 0–44)
AST: 24 U/L (ref 15–41)
Albumin: 4.3 g/dL (ref 3.5–5.0)
Alkaline Phosphatase: 48 U/L (ref 38–126)
Anion gap: 11 (ref 5–15)
BUN: 11 mg/dL (ref 8–23)
CO2: 24 mmol/L (ref 22–32)
Calcium: 9.6 mg/dL (ref 8.9–10.3)
Chloride: 103 mmol/L (ref 98–111)
Creatinine, Ser: 0.98 mg/dL (ref 0.61–1.24)
GFR calc Af Amer: >60 mL/min (ref >60)
GFR calc non Af Amer: >60 mL/min (ref >60)
Glucose, Bld: 130 mg/dL — ABNORMAL HIGH (ref 70–99)
Potassium: 4.1 mmol/L (ref 3.5–5.1)
Sodium: 138 mmol/L (ref 135–145)
Total Bilirubin: 1.1 mg/dL (ref 0.3–1.2)
Total Protein: 7.2 g/dL (ref 6.5–8.1)

## 2018-04-20 LAB — CBC
HCT: 49.2 % (ref 39.0–52.0)
Hemoglobin: 16.6 g/dL (ref 13.0–17.0)
MCH: 29.5 pg (ref 26.0–34.0)
MCHC: 33.7 g/dL (ref 30.0–36.0)
MCV: 87.5 fL (ref 80.0–100.0)
Platelets: 134 10*3/uL — ABNORMAL LOW (ref 150–400)
RBC: 5.62 MIL/uL (ref 4.22–5.81)
RDW: 12.4 % (ref 11.5–15.5)
WBC: 8.4 10*3/uL (ref 4.0–10.5)
nRBC: 0 % (ref 0.0–0.2)

## 2018-04-20 LAB — LIPASE, BLOOD: Lipase: 19 U/L (ref 11–51)

## 2018-04-20 MED ORDER — IOHEXOL 300 MG/ML  SOLN
100.0000 mL | Freq: Once | INTRAMUSCULAR | Status: AC | PRN
  Administered 2018-04-20: 100 mL via INTRAVENOUS

## 2018-04-20 MED ORDER — LORAZEPAM 2 MG/ML IJ SOLN
1.0000 mg | Freq: Once | INTRAMUSCULAR | Status: AC
  Administered 2018-04-20: 1 mg via INTRAVENOUS
  Filled 2018-04-20: qty 1

## 2018-04-20 MED ORDER — SODIUM CHLORIDE 0.9 % IV BOLUS
1000.0000 mL | Freq: Once | INTRAVENOUS | Status: AC
  Administered 2018-04-20: 1000 mL via INTRAVENOUS

## 2018-04-20 MED ORDER — SODIUM CHLORIDE 0.9% FLUSH
3.0000 mL | Freq: Once | INTRAVENOUS | Status: AC
  Administered 2018-04-20: 3 mL via INTRAVENOUS

## 2018-04-20 MED ORDER — PRAMIPEXOLE DIHYDROCHLORIDE 1 MG PO TABS
2.0000 mg | ORAL_TABLET | Freq: Once | ORAL | Status: DC
  Filled 2018-04-20: qty 2

## 2018-04-20 MED ORDER — ONDANSETRON 4 MG PO TBDP: 4.0000 mg | ORAL_TABLET | Freq: Three times a day (TID) | ORAL | 0 refills | Status: DC | PRN

## 2018-04-20 MED ORDER — MORPHINE SULFATE (PF) 4 MG/ML IV SOLN
4.0000 mg | Freq: Once | INTRAVENOUS | Status: AC
  Administered 2018-04-20: 4 mg via INTRAVENOUS
  Filled 2018-04-20: qty 1

## 2018-04-20 MED ORDER — ONDANSETRON HCL 4 MG/2ML IJ SOLN
4.0000 mg | Freq: Once | INTRAMUSCULAR | Status: AC
  Administered 2018-04-20: 4 mg via INTRAVENOUS
  Filled 2018-04-20: qty 2

## 2018-04-20 NOTE — ED Notes (Signed)
Updated that room is being cleaned.  

## 2018-04-20 NOTE — ED Triage Notes (Signed)
Pt presents for evaluation of general abd pain starting around 3 am this morning. Pt was sent from [facility] for further eval. Reports nausea, no vomiting or diarrhea. States hx of DM.

## 2018-04-20 NOTE — ED Provider Notes (Signed)
[facility] EMERGENCY DEPARTMENT Provider Note   CSN: [id number] Arrival date & time: 04/20/18  1204    History   Chief Complaint Chief Complaint  Patient presents with  . Abdominal Pain    HPI Kenneth Sprinkle Sr. is a 68 y.o. male with PMH/o CAD, DM, HTN, who presents for evaluation of lower abdominal pain that began at approximately 3 AM this morning.  Patient states he woke up at about 3 AM earlier this morning and stated he noticed a dull ache in his middle and lower abdomen, mostly on the right side.  He states that since then, pain has progressively worsened.  He states he always has a constant dull ache but every once in a while, and will have a sharp pain in the right side.  He states pain does not radiate.  He states that he tried to eat breakfast this morning but reports 2 episodes of nonbloody, nonbilious emesis soon after.  He reports since then, he has not had any appetite.  He denies any diarrhea.  Patient states he has not had any fever.  He went to urgent care initially and was prompted to the emergency department for further evaluation.  He does report prior hernia repair around the periumbilical region.  No other abdominal surgeries noted.  His last bowel movement was yesterday.  Patient denies any fever, chest pain, difficulty breathing, dysuria, hematuria, testicular pain or swelling.     The history is provided by the patient.    Past Medical History:  Diagnosis Date  . Arthritis    "left thumb" (05/23/2017)  . Barrett's esophagus    "we've been told that it's all gone; still take RX for GERD" (05/23/2017)  . CAD (coronary artery disease), native coronary artery    Coronary angiogram 05/03/2014:  Proximal LAD 3.0x12 mm Promus premier stent.  04/08/2014: Mid Cx 3.5 x 16 mm Promus DES.  03/29/2013: Mid LAD 2.75 x 38 mm Promus Premier drug-eluting stent, balloon angioplasty of D1 and distal LAD and stenting of distal RCA with 2.75 x 24 mm promos Premier  drug-eluting stent 12/15/2012 widely patent.  . Cervicalgia   . Chronic bronchitis (HCC)    "q yr" (05/23/2017)  . Complication of anesthesia    " I do not wake up very well "  . Family history of adverse reaction to anesthesia    "son w/PONV"  . GERD (gastroesophageal reflux disease)   . Heart murmur    "grew out of it"   . Hyperlipidemia   . Hypoglycemia, unspecified   . IDDM (insulin dependent diabetes mellitus) (HCC)   . Impotence of organic origin   . Iron deficiency anemia   . Other malaise and fatigue   . PONV (postoperative nausea and vomiting)   . Restless leg   . Tension headache    "sometimes" (05/23/2017)  . Unspecified gastritis and gastroduodenitis with hemorrhage   . Unstable angina pectoris (HCC) 04/08/2014   Coronary angiogram 05/03/2014:  Proximal LAD 3.0x12 mm Promus premier stent.  04/08/2014: Mid Cx 3.5 x 16 mm Promus DES.  03/29/2013: Mid LAD 2.75 x 38 mm Promus Premier drug-eluting stent, balloon angioplasty of D1 and distal LAD and stenting of distal RCA with 2.75 x 24 mm promos Premier drug-eluting stent 12/15/2012 widely patent.    Patient Active Problem List   Diagnosis Date Noted  . Simple chronic bronchitis (HCC) 10/09/2017  . 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 TSH elevation 07/05/2015  . Plantar fasciitis 04/26/2015  .  Tremor 02/14/2015  . Left-sided ischial pain 10/19/2014  . Voice disorder 10/19/2014  . Post PTCA 05/03/2014  . Unstable angina (HCC) 04/08/2014  . Anemia, iron deficiency 09/22/2013  . Elevated CPK 09/21/2013  . Rhabdomyolysis 09/18/2013  . Shortness of breath 08/31/2013  . Achilles tendon pain 06/16/2013  . Pain in joint, shoulder region 03/10/2013  . CAD (coronary artery disease), native coronary artery 12/15/2012  . Chest pain 11/11/2012  . Uncontrolled diabetes mellitus with circulatory complication, with long-term current use of insulin (HCC) 06/16/2012  . Impotence of organic origin 06/16/2012  . Umbilical hernia without  mention of obstruction or gangrene 06/16/2012  . Barrett's esophagus 06/16/2012  . Unspecified gastritis and gastroduodenitis with hemorrhage 06/16/2012  . 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 Hyperlipidemia 06/16/2012  . Abdominal pain, epigastric 06/16/2012  . Cholelithiasis 06/16/2012  . Slowing of urinary stream 06/16/2012  . Pain in joint, lower leg 06/16/2012  . Essential hypertension 06/16/2012  . Restless legs syndrome (RLS) 06/16/2012  . Other malaise and fatigue 06/16/2012  . Tension headache 06/16/2012  . Reflux     Past Surgical History:  Procedure Laterality Date  . CARDIAC CATHETERIZATION N/A 09/25/2015   Procedure: Left Heart Cath and Coronary Angiography;  Surgeon: [name], MD;  Location: [facility] INVASIVE CV LAB;  Service: Cardiovascular;  Laterality: N/A;  . CARDIAC CATHETERIZATION N/A 09/25/2015   Procedure: Intravascular Pressure Wire/FFR Study;  Surgeon: [name], MD;  Location: [facility] INVASIVE CV LAB;  Service: Cardiovascular;  Laterality: N/A;  . CORONARY ANGIOPLASTY WITH STENT PLACEMENT  12/15/2012; 04/28/2014; 05/03/2014   "2; 1; 1"   . FRACTIONAL FLOW RESERVE WIRE Right 03/26/2013   Procedure: FRACTIONAL FLOW RESERVE WIRE;  Surgeon: Pamella Pert, MD;  Location: [facility] CATH LAB;  Service: Cardiovascular;  Laterality: Right;  . FRACTIONAL FLOW RESERVE WIRE N/A 05/03/2014   Procedure: FRACTIONAL FLOW RESERVE WIRE;  Surgeon: Pamella Pert, MD;  Location: [facility] CATH LAB;  Service: Cardiovascular;  Laterality: N/A;  . HERNIA REPAIR  2010   "umbilical"   . INCISION AND DRAINAGE ABSCESS Left 05/2007   "groin"   . KNEE SURGERY Right 1992;  2000   "calcium deposits removed" (12/15/2012)  . LEFT HEART CATH AND CORONARY ANGIOGRAPHY N/A 05/23/2017   Procedure: LEFT HEART CATH AND CORONARY ANGIOGRAPHY;  Surgeon: [name], MD;  Location: [facility] INVASIVE CV LAB;  Service: Cardiovascular;  Laterality: N/A;  . LEFT HEART CATHETERIZATION WITH  CORONARY ANGIOGRAM N/A 12/15/2012   Procedure: LEFT HEART CATHETERIZATION WITH CORONARY ANGIOGRAM;  Surgeon: Pamella Pert, MD;  Location: [facility] CATH LAB;  Service: Cardiovascular;  Laterality: N/A;  . LEFT HEART CATHETERIZATION WITH CORONARY ANGIOGRAM N/A 03/26/2013   Procedure: LEFT HEART CATHETERIZATION WITH CORONARY ANGIOGRAM;  Surgeon: Pamella Pert, MD;  Location: [facility] CATH LAB;  Service: Cardiovascular;  Laterality: N/A;  . LEFT HEART CATHETERIZATION WITH CORONARY ANGIOGRAM N/A 04/08/2014   Procedure: LEFT HEART CATHETERIZATION WITH CORONARY ANGIOGRAM;  Surgeon: Micheline Chapman, MD;  Location: [facility] CATH LAB;  Service: Cardiovascular;  Laterality: N/A;  . PERCUTANEOUS CORONARY STENT INTERVENTION (PCI-S) N/A 05/03/2014   Procedure: PERCUTANEOUS CORONARY STENT INTERVENTION (PCI-S);  Surgeon: Pamella Pert, MD;  Location: [facility] CATH LAB;  Service: Cardiovascular;  Laterality: N/A;  . UMBILICAL HERNIA REPAIR          Home Medications    Prior to Admission medications   Medication Sig Start Date End Date Taking? Authorizing Provider  acetaminophen (TYLENOL) 500 MG tablet  Take 1,000 mg by mouth every 6 (six) hours as needed for headache.    Yes [provider]  albuterol (PROVENTIL HFA;VENTOLIN HFA) 108 (90 Base) MCG/ACT inhaler Inhale 2 puffs into the lungs every 6 (six) hours as needed for wheezing or shortness of breath. 10/09/17  Yes Reed, Tiffany L, DO  Alirocumab (PRALUENT) 75 MG/ML SOPN Inject 75 mg into the skin every 14 (fourteen) days.    Yes [provider]  aspirin 81 MG EC tablet Take 1 tablet (81 mg total) by mouth daily. 12/16/12  Yes [name], MD  carvedilol (COREG) 6.25 MG tablet Take 1/2 tablet by mouth twice daily with meals Patient taking differently: Take 6.25 mg by mouth 2 (two) times daily.  08/14/17  Yes Reed, Tiffany L, DO  fluticasone (FLONASE) 50 MCG/ACT nasal spray Place 1 spray into both nostrils daily as needed for allergies or rhinitis.   Yes  [provider]  gabapentin (NEURONTIN) 100 MG capsule Take 200 mg by mouth at bedtime.  05/14/17  Yes [provider]  Insulin Glargine (BASAGLAR KWIKPEN) 100 UNIT/ML SOPN INJECT 40 UNITS SUBCUTANEOUSLY INTO THE SKIN AT BEDTIME Patient taking differently: Inject 40 Units into the skin at bedtime.  03/20/18  Yes Reed, Tiffany L, DO  nitroGLYCERIN (NITROSTAT) 0.4 MG SL tablet PLACE 1 TABLET (0.4 MG TOTAL) UNDER THE TONGUE EVERY 5 (FIVE) MINUTES AS NEEDED FOR CHEST PAIN. 06/23/17  Yes Reed, Tiffany L, DO  pantoprazole (PROTONIX) 40 MG tablet Take one tablet by mouth daily Patient taking differently: Take 40 mg by mouth daily.  02/12/18  Yes [name], NP  pramipexole (MIRAPEX) 1 MG tablet TAKE 2 TABLETS (2 MG TOTAL) BY MOUTH AT BEDTIME. 04/10/18  Yes Reed, Tiffany L, DO  valsartan (DIOVAN) 80 MG tablet Take 80 mg by mouth daily.   Yes [provider]  Insulin Pen Needle (PEN NEEDLES) 32G X 4 MM MISC at bedtime.    [provider]  ondansetron (ZOFRAN ODT) 4 MG disintegrating tablet Take 1 tablet (4 mg total) by mouth every 8 (eight) hours as needed for nausea or vomiting. 04/20/18   Maxwell Caul, PA-C    Family History Family History  Adopted: Yes  Family history unknown: Yes    Social History Social History   Tobacco Use  . Smoking status: Never Smoker  . Smokeless tobacco: Never Used  Substance Use Topics  . Alcohol use: No  . Drug use: No     Allergies   Lyrica [pregabalin]; Statins; and Trulicity [dulaglutide]   Review of Systems Review of Systems  Constitutional: Negative for fever.  Respiratory: Negative for cough and shortness of breath.   Cardiovascular: Negative for chest pain.  Gastrointestinal: Positive for abdominal pain, nausea and vomiting. Negative for diarrhea.  Genitourinary: Negative for dysuria, hematuria, scrotal swelling and testicular pain.  Neurological: Negative for headaches.  All other systems reviewed and  are negative.    Physical Exam Updated Vital Signs BP 113/62 (BP Location: Right Arm)   Pulse 82   Temp 98.7 F (37.1 C) (Oral)   Resp (!) 22   SpO2 100%   Physical Exam Vitals signs and nursing note reviewed. Exam conducted with a chaperone present.  Constitutional:      Appearance: Normal appearance. He is well-developed.  HENT:     Head: Normocephalic and atraumatic.  Eyes:     General: Lids are normal.     Conjunctiva/sclera: Conjunctivae normal.     Pupils: Pupils are  equal, round, and reactive to light.  Neck:     Musculoskeletal: Full passive range of motion without pain.  Cardiovascular:     Rate and Rhythm: Normal rate and regular rhythm.     Pulses: Normal pulses.          Radial pulses are 2+ on the right side and 2+ on the left side.       Dorsalis pedis pulses are 2+ on the right side and 2+ on the left side.     Heart sounds: Normal heart sounds. No murmur. No friction rub. No gallop.   Pulmonary:     Effort: Pulmonary effort is normal.     Breath sounds: Normal breath sounds.     Comments: Lungs clear to auscultation bilaterally.  Symmetric chest rise.  No wheezing, rales, rhonchi. Abdominal:     Palpations: Abdomen is soft. Abdomen is not rigid.     Tenderness: There is abdominal tenderness in the right lower quadrant and suprapubic area. There is no right CVA tenderness, left CVA tenderness or guarding. Positive signs include McBurney's sign.     Comments: Abdomen is soft, non-distended.Tenderness noted to RLQ at McBurney's point.  No rigidity, No guarding. No peritoneal signs. No CVA tenderness bilaterally.   Genitourinary:    Penis: Normal.      Scrotum/Testes: Normal.        Right: Tenderness or swelling not present.        Left: Tenderness or swelling not present.     Comments: The exam was performed with a chaperone present. Normal male genitalia. No evidence of rash, ulcers or lesions.  Musculoskeletal: Normal range of motion.  Skin:    General:  Skin is warm and dry.     Capillary Refill: Capillary refill takes less than 2 seconds.  Neurological:     Mental Status: He is alert and oriented to person, place, and time.     Comments: Baseline tremor which patient states is normal.  Psychiatric:        Speech: Speech normal.      ED Treatments / Results  Labs (all labs ordered are listed, but only abnormal results are displayed) Labs Reviewed  COMPREHENSIVE METABOLIC PANEL - Abnormal; Notable for the following components:      Result Value   Glucose, Bld 130 (*)    All other components within normal limits  CBC - Abnormal; Notable for the following components:   Platelets 134 (*)    All other components within normal limits  URINALYSIS, ROUTINE W REFLEX MICROSCOPIC - Abnormal; Notable for the following components:   APPearance HAZY (*)    Ketones, ur 5 (*)    All other components within normal limits  CBG MONITORING, ED - Abnormal; Notable for the following components:   Glucose-Capillary 126 (*)    All other components within normal limits  LIPASE, BLOOD    EKG EKG Interpretation  Date/Time:  Monday April 20 2018 16:29:40 EST Ventricular Rate:  71 PR Interval:    QRS Duration: 93 QT Interval:  427 QTC Calculation: 444 R Axis:   -40 Text Interpretation:  Sinus rhythm Artifact Poor data quality Confirmed by Raeford Razor [phone]) on 04/20/2018 5:44:11 PM   Radiology Ct Abdomen Pelvis W Contrast  Result Date: 04/20/2018 CLINICAL DATA:  68 year old male with abdominal pain since 0300 hours. EXAM: CT ABDOMEN AND PELVIS WITH CONTRAST TECHNIQUE: Multidetector CT imaging of the abdomen and pelvis was performed using the standard protocol following bolus administration of intravenous  contrast. CONTRAST:  100mL OMNIPAQUE IOHEXOL 300 MG/ML  SOLN COMPARISON:  CT Abdomen and Pelvis 04/15/2016 and earlier. FINDINGS: Lower chest: Mild atelectasis at both lung bases. No pericardial or pleural effusion. Calcified coronary  artery atherosclerosis and/or stents. Hepatobiliary: Negative gallbladder. Hepatic steatosis. Pancreas: Negative. Spleen: Negative. Adrenals/Urinary Tract: Normal adrenal glands. Bilateral renal enhancement and contrast excretion is symmetric and normal. Small benign left renal lower pole cyst re-demonstrated. No hydronephrosis. Both ureters appear normal to the bladder. Diminutive and unremarkable urinary bladder. Stomach/Bowel: Decompressed rectosigmoid colon. Mild retained stool in the transverse and descending colon. Negative right colon. No large bowel wall thickening. Normal appendix on coronal image 77. Fluid-filled terminal ileum and much of the small bowel in the abdomen, although small bowel caliber is normal to at the upper limits of normal (30 millimeters diameter). The proximal jejunum is decompressed. There is no transition point. But there is a trace amount of free fluid in the distal right small bowel mesentery (also on coronal image 77). Negative stomach. Duodenum is negative aside from several duodenal diverticula. No free air. Vascular/Lymphatic: Aortoiliac calcified atherosclerosis. Major arterial structures are patent. Portal venous system is patent. No lymphadenopathy. Reproductive: Negative. Other: No pelvic free fluid. Musculoskeletal: No acute osseous abnormality identified. IMPRESSION: 1. Fluid-filled but generally nondilated small bowel in the abdomen with a trace amount of free fluid in the small bowel mesentery. This is nonspecific but suspicious for Mild Acute Enteritis. Normal appendix, and no large bowel inflammation. 2. No other acute or inflammatory process in the abdomen or pelvis. 3. Hepatic steatosis.   Aortic Atherosclerosis (ICD10-I70.0). Electronically Signed   By: Odessa FlemingH  Hall M.D.   On: 04/20/2018 19:58    Procedures Procedures (including critical care time)  Medications Ordered in ED Medications  sodium chloride flush (NS) 0.9 % injection 3 mL (3 mLs Intravenous Given  04/20/18 1859)  sodium chloride 0.9 % bolus 1,000 mL (0 mLs Intravenous Stopped 04/20/18 1859)  morphine 4 MG/ML injection 4 mg (4 mg Intravenous Given 04/20/18 1701)  LORazepam (ATIVAN) injection 1 mg (1 mg Intravenous Given 04/20/18 1935)  iohexol (OMNIPAQUE) 300 MG/ML solution 100 mL (100 mLs Intravenous Contrast Given 04/20/18 1929)  ondansetron (ZOFRAN) injection 4 mg (4 mg Intravenous Given 04/20/18 2048)     Initial Impression / Assessment and Plan / ED Course  I have reviewed the triage vital signs and the nursing notes.  Pertinent labs & imaging results that were available during my care of the patient were reviewed by me and considered in my medical decision making (see chart for details).        68 year old male presents for evaluation of lower abdominal pain that began this morning at 3 AM.  Also reports some associated nausea/vomiting.  No fevers, diarrhea.  Denies any chest pain, difficulty breathing. Patient is afebrile, non-toxic appearing, sitting comfortably on examination table. Vital signs reviewed and stable.  Exam, tenderness noted to the right lower quadrant.  No CVA tenderness.  Concern for infectious etiology such as appendicitis.  Low suspicion for kidney stone given history/physical exam but also consideration.  Doubt DKA.  Doubt perforation, obstruction.  Do not suspect dissection.  Usual labs ordered at triage.  Will plan for CT on pelvis for evaluation.  CBC without any significant leukocytosis or anemia.  CMP unremarkable. Work up not concerning for DKA.  Lipase unremarkable.  CT abd/pelvis shows normal appendix. There is fluid-filled but non-dilated small bowel that could represent enteritis. No evidence of obstruction. No other acute abnormalities.  Discussed results with patient.  He reports improved abdominal pain.  He had 1 more episode of vomiting here in the ED.  Will give Zofran and p.o. challenge.  Re-evaluation. Repeat abdominal exam is improved. Patient  is drinking a diet coke. At this time, patient exhibits no emergent life-threatening condition that require further evaluation in ED or admission. Patient had ample opportunity for questions and discussion. All patient's questions were answered with full understanding. Strict return precautions discussed. Patient expresses understanding and agreement to plan.   Portions of this note were generated with Scientist, clinical (histocompatibility and immunogenetics). Dictation errors may occur despite best attempts at proofreading.   Final Clinical Impressions(s) / ED Diagnoses   Final diagnoses:  Right lower quadrant abdominal pain  Non-intractable vomiting with nausea, unspecified vomiting type    ED Discharge Orders         Ordered    ondansetron (ZOFRAN ODT) 4 MG disintegrating tablet  Every 8 hours PRN     04/20/18 2127           Rosana Hoes 04/21/18 1527    Raeford Razor, MD 04/21/18 1659

## 2018-04-20 NOTE — Discharge Instructions (Signed)
Take zofran as directed.   Make sure you are drinking plenty of fluids and staying hydrated.   Return to the Emergency Department for any fever, worsening abdominal pain, persistent vomiting despite medication or any other worsening or concerning symptoms.

## 2018-04-24 ENCOUNTER — Telehealth: Payer: Self-pay

## 2018-04-24 DIAGNOSIS — E1165 Type 2 diabetes mellitus with hyperglycemia: Principal | ICD-10-CM

## 2018-04-24 DIAGNOSIS — Z794 Long term (current) use of insulin: Principal | ICD-10-CM

## 2018-04-24 DIAGNOSIS — IMO0002 Reserved for concepts with insufficient information to code with codable children: Secondary | ICD-10-CM

## 2018-04-24 DIAGNOSIS — E1159 Type 2 diabetes mellitus with other circulatory complications: Secondary | ICD-10-CM

## 2018-04-24 MED ORDER — FREESTYLE LIBRE 14 DAY SENSOR MISC: 1.0000 | Freq: Every day | 12 refills | Status: DC | PRN

## 2018-04-24 MED ORDER — FREESTYLE LIBRE 14 DAY READER DEVI: 1.0000 | Freq: Every day | 12 refills | Status: DC | PRN

## 2018-04-24 NOTE — Telephone Encounter (Signed)
Patient called requesting rx Freestyle [facility]re to [facility] on file. Patient states he checked with medicare and they will cover.  RX sent as requested

## 2018-04-29 ENCOUNTER — Encounter: Payer: Medicare Other | Admitting: Family

## 2018-04-29 ENCOUNTER — Ambulatory Visit: Payer: Self-pay

## 2018-04-30 ENCOUNTER — Other Ambulatory Visit: Payer: Self-pay | Admitting: Internal Medicine

## 2018-05-05 ENCOUNTER — Other Ambulatory Visit: Payer: Self-pay | Admitting: Nurse Practitioner

## 2018-05-05 DIAGNOSIS — G2581 Restless legs syndrome: Secondary | ICD-10-CM

## 2018-05-06 ENCOUNTER — Encounter: Payer: Medicare Other | Admitting: Family

## 2018-05-28 ENCOUNTER — Other Ambulatory Visit: Payer: Medicare Other

## 2018-05-28 ENCOUNTER — Other Ambulatory Visit: Payer: Self-pay

## 2018-05-28 DIAGNOSIS — Z794 Long term (current) use of insulin: Secondary | ICD-10-CM | POA: Diagnosis not present

## 2018-05-28 DIAGNOSIS — E1159 Type 2 diabetes mellitus with other circulatory complications: Secondary | ICD-10-CM

## 2018-05-28 DIAGNOSIS — E1165 Type 2 diabetes mellitus with hyperglycemia: Secondary | ICD-10-CM | POA: Diagnosis not present

## 2018-05-28 DIAGNOSIS — IMO0002 Reserved for concepts with insufficient information to code with codable children: Secondary | ICD-10-CM

## 2018-05-29 LAB — BASIC METABOLIC PANEL WITH GFR
BUN: 18 mg/dL (ref 7–25)
CO2: 30 mmol/L (ref 20–32)
Calcium: 9.2 mg/dL (ref 8.6–10.3)
Chloride: 103 mmol/L (ref 98–110)
Creat: 1.16 mg/dL (ref 0.70–1.25)
GFR, Est African American: 75 mL/min/{1.73_m2} (ref >=60)
GFR, Est Non African American: 64 mL/min/{1.73_m2} (ref >=60)
Glucose, Bld: 135 mg/dL — ABNORMAL HIGH (ref 65–99)
POTASSIUM: 4.5 mmol/L (ref 3.5–5.3)
Sodium: 138 mmol/L (ref 135–146)

## 2018-05-29 LAB — HEMOGLOBIN A1C
Hgb A1c MFr Bld: 8.6 % of total Hgb — ABNORMAL HIGH (ref <5.7)
Mean Plasma Glucose: 200 (calc)
eAG (mmol/L): 11.1 (calc)

## 2018-05-30 ENCOUNTER — Other Ambulatory Visit: Payer: Self-pay | Admitting: Nurse Practitioner

## 2018-05-30 ENCOUNTER — Other Ambulatory Visit: Payer: Self-pay | Admitting: Internal Medicine

## 2018-05-30 DIAGNOSIS — G2581 Restless legs syndrome: Secondary | ICD-10-CM

## 2018-06-01 ENCOUNTER — Ambulatory Visit (INDEPENDENT_AMBULATORY_CARE_PROVIDER_SITE_OTHER): Payer: Medicare Other | Admitting: Nurse Practitioner

## 2018-06-01 ENCOUNTER — Other Ambulatory Visit: Payer: Self-pay

## 2018-06-01 DIAGNOSIS — R05 Cough: Secondary | ICD-10-CM

## 2018-06-01 DIAGNOSIS — E1165 Type 2 diabetes mellitus with hyperglycemia: Secondary | ICD-10-CM | POA: Diagnosis not present

## 2018-06-01 DIAGNOSIS — I25118 Atherosclerotic heart disease of native coronary artery with other forms of angina pectoris: Secondary | ICD-10-CM | POA: Diagnosis not present

## 2018-06-01 DIAGNOSIS — E1151 Type 2 diabetes mellitus with diabetic peripheral angiopathy without gangrene: Secondary | ICD-10-CM

## 2018-06-01 DIAGNOSIS — E785 Hyperlipidemia, unspecified: Secondary | ICD-10-CM | POA: Diagnosis not present

## 2018-06-01 DIAGNOSIS — IMO0002 Reserved for concepts with insufficient information to code with codable children: Secondary | ICD-10-CM

## 2018-06-01 DIAGNOSIS — G2581 Restless legs syndrome: Secondary | ICD-10-CM | POA: Diagnosis not present

## 2018-06-01 DIAGNOSIS — I10 Essential (primary) hypertension: Secondary | ICD-10-CM

## 2018-06-01 DIAGNOSIS — K219 Gastro-esophageal reflux disease without esophagitis: Secondary | ICD-10-CM

## 2018-06-01 DIAGNOSIS — R059 Cough, unspecified: Secondary | ICD-10-CM

## 2018-06-01 MED ORDER — ONETOUCH ULTRA 2 W/DEVICE KIT: PACK | 0 refills | Status: DC

## 2018-06-01 MED ORDER — GLUCOSE BLOOD VI STRP: ORAL_STRIP | 1 refills | Status: DC

## 2018-06-01 MED ORDER — EMPAGLIFLOZIN 10 MG PO TABS: 10.0000 mg | ORAL_TABLET | Freq: Every day | ORAL | 1 refills | Status: DC

## 2018-06-01 MED ORDER — ONETOUCH FINEPOINT LANCETS MISC: 1 refills | Status: AC

## 2018-06-01 NOTE — Progress Notes (Signed)
This service is provided via telemedicine  No vital signs collected/recorded due to the encounter was a telemedicine visit.   Location of patient (ex: home, work):  HOME  Patient consents to a telephone visit:  YES  Location of the provider (ex: office, home):  OFFICE  Name of any referring provider:  TIFFANY REED, DO  Names of all persons participating in the telemedicine service and their role in the encounter:  Sherrie Mustache, NP, Edwin Dada, [place], [place] PATIENT  Time spent on call:  7:42   Virtual Visit via Telephone Note  I connected with Kenneth Lazo Sr. on 06/01/18 at  8:30 AM EDT by telephone and verified that I am speaking with the correct person using two identifiers.      Careteam: Patient Care Team: Gayland Curry, DO as PCP - General (Geriatric Medicine) Adrian Prows, MD as Consulting Physician (Cardiology) Cameron Sprang, MD as Consulting Physician (Neurology) [place], [name], [place] (Optometry) Melida Quitter, MD as Consulting Physician (Otolaryngology)  Advanced Directive information    Allergies  Allergen Reactions  . Lyrica [Pregabalin] Other (See Comments)    Made patient very lethargic the next morning, very hard to patient to function, move, etc.   . Statins Other (See Comments)    Causes Restless Legs, rhabdomyolysis  . Trulicity [Dulaglutide] Other (See Comments)    GI side effects     Chief Complaint  Patient presents with  . telephone visit    21mh follow-up, discuss labs     HPI: Patient is a 68y.o. male for routine follow up via telephone visit.   DM- A1c improved to 8.6 down from 9.1. blood sugars all over the place. Blood sugar crashed this morning (he was out of strips so he could not test it)  Taking basaglar 40 units every night.  Has been trying to eat better, hard when he is traveling. Counting carbs.  Unable to use metformin due to GI upset.  -has neuropathy  Nonfasting 30 day avg blood sugars- 223 Fasting 130, 148,  145 332 in evening on one occasion Higher readings in the evening.  Request refill on albuterol. Reports he had increase cough and congestion. States he uses the albuterol as needed which helps. He is hoarse and this helps with that.  Reports he is short of breath when he ties his shoe, otherwise without shortness of breath.  Non-smoker.  No GERD. Runny nose.  No fever  Hyperlipidemia- LDL at goal on alirocumab, getting this through cardiologist.   RLS- following with neurologist, not controlled. Taking mirapex and gabapentin- he is self adjusting this based on symptoms. he is taking mirapex 2 of the 1 mg and gabapentin 2 of the 100 mg daily.  CAD- following with Dr GEinar Gip on ASA 81 mg daily. No current chest pains. Last OV ~1 month ago and now seeing yearly.   HTN- coreg 6.25 mg by mouth twice daily.does not take bp at home because never had a problem with this being eleated.    GERD- using protonix 40 mg by mouth twice daily, talked about decreasing to 1 tablet but he did not try this, feels like he would be fine taking 1 tablet daily .   Review of Systems:  Review of Systems  Constitutional: Negative for chills and fever.  HENT: Negative for congestion.   Eyes: Negative for blurred vision.  Respiratory: Positive for cough. Negative for sputum production, shortness of breath and wheezing.   Cardiovascular: Negative for chest pain, palpitations  and leg swelling.  Gastrointestinal: Negative for abdominal pain, blood in stool, constipation and melena.  Genitourinary: Negative for dysuria.  Musculoskeletal: Negative for falls and joint pain.  Skin: Negative for itching and rash.  Neurological: Positive for tingling, tremors and sensory change. Negative for dizziness and loss of consciousness.       Restless legs  Endo/Heme/Allergies: Does not bruise/bleed easily.  Psychiatric/Behavioral: Negative for memory loss. The patient is not nervous/anxious and does not have insomnia.      Past Medical History:  Diagnosis Date  . Arthritis    "left thumb" (05/23/2017)  . Barrett's esophagus    "we've been told that it's all gone; still take RX for GERD" (05/23/2017)  . CAD (coronary artery disease), native coronary artery    Coronary angiogram 05/03/2014:  Proximal LAD 3.0x12 mm Promus premier stent.  04/08/2014: Mid Cx 3.5 x 16 mm Promus DES.  03/29/2013: Mid LAD 2.75 x 38 mm Promus Premier drug-eluting stent, balloon angioplasty of D1 and distal LAD and stenting of distal RCA with 2.75 x 24 mm promos Premier drug-eluting stent 12/15/2012 widely patent.  . Cervicalgia   . Chronic bronchitis ([place])    "q yr" (05/23/2017)  . Complication of anesthesia    " I do not wake up very well "  . Family history of adverse reaction to anesthesia    "son w/PONV"  . GERD (gastroesophageal reflux disease)   . Heart murmur    "grew out of it"   . Hyperlipidemia   . Hypoglycemia, unspecified   . IDDM (insulin dependent diabetes mellitus) ([place])   . Impotence of organic origin   . Iron deficiency anemia   . Other malaise and fatigue   . PONV (postoperative nausea and vomiting)   . Restless leg   . Tension headache    "sometimes" (05/23/2017)  . Unspecified gastritis and gastroduodenitis with hemorrhage   . Unstable angina pectoris ([place]) 04/08/2014   Coronary angiogram 05/03/2014:  Proximal LAD 3.0x12 mm Promus premier stent.  04/08/2014: Mid Cx 3.5 x 16 mm Promus DES.  03/29/2013: Mid LAD 2.75 x 38 mm Promus Premier drug-eluting stent, balloon angioplasty of D1 and distal LAD and stenting of distal RCA with 2.75 x 24 mm promos Premier drug-eluting stent 12/15/2012 widely patent.   Past Surgical History:  Procedure Laterality Date  . CARDIAC CATHETERIZATION N/A 09/25/2015   Procedure: Left Heart Cath and Coronary Angiography;  Surgeon: Adrian Prows, MD;  Location: [place] [facility];  Service: Cardiovascular;  Laterality: N/A;  . CARDIAC CATHETERIZATION N/A 09/25/2015   Procedure: Intravascular  Pressure Wire/FFR Study;  Surgeon: Adrian Prows, MD;  Location: [place] [facility];  Service: Cardiovascular;  Laterality: N/A;  . CORONARY ANGIOPLASTY WITH STENT PLACEMENT  12/15/2012; 04/28/2014; 05/03/2014   "2; 1; 1"   . FRACTIONAL FLOW RESERVE WIRE Right 03/26/2013   Procedure: FRACTIONAL FLOW RESERVE WIRE;  Surgeon: Laverda Page, MD;  Location: [facility] CATH LAB;  Service: Cardiovascular;  Laterality: Right;  . FRACTIONAL FLOW RESERVE WIRE N/A 05/03/2014   Procedure: FRACTIONAL FLOW RESERVE WIRE;  Surgeon: Laverda Page, MD;  Location: [facility] CATH LAB;  Service: Cardiovascular;  Laterality: N/A;  . HERNIA REPAIR  3419   "umbilical"   . INCISION AND DRAINAGE ABSCESS Left 05/2007   "groin"   . KNEE SURGERY Right 1992;  2000   "calcium deposits removed" (12/15/2012)  . LEFT HEART CATH AND CORONARY ANGIOGRAPHY N/A 05/23/2017   Procedure: LEFT HEART CATH AND CORONARY ANGIOGRAPHY;  Surgeon:  Adrian Prows, MD;  Location: [place] [facility];  Service: Cardiovascular;  Laterality: N/A;  . LEFT HEART CATHETERIZATION WITH CORONARY ANGIOGRAM N/A 12/15/2012   Procedure: LEFT HEART CATHETERIZATION WITH CORONARY ANGIOGRAM;  Surgeon: Laverda Page, MD;  Location: [facility] CATH LAB;  Service: Cardiovascular;  Laterality: N/A;  . LEFT HEART CATHETERIZATION WITH CORONARY ANGIOGRAM N/A 03/26/2013   Procedure: LEFT HEART CATHETERIZATION WITH CORONARY ANGIOGRAM;  Surgeon: Laverda Page, MD;  Location: [facility] CATH LAB;  Service: Cardiovascular;  Laterality: N/A;  . LEFT HEART CATHETERIZATION WITH CORONARY ANGIOGRAM N/A 04/08/2014   Procedure: LEFT HEART CATHETERIZATION WITH CORONARY ANGIOGRAM;  Surgeon: Blane Ohara, MD;  Location: [facility] CATH LAB;  Service: Cardiovascular;  Laterality: N/A;  . PERCUTANEOUS CORONARY STENT INTERVENTION (PCI-S) N/A 05/03/2014   Procedure: PERCUTANEOUS CORONARY STENT INTERVENTION (PCI-S);  Surgeon: Laverda Page, MD;  Location: [facility] CATH LAB;  Service: Cardiovascular;  Laterality: N/A;  . UMBILICAL  HERNIA REPAIR     Social History:   reports that he has never smoked. He has never used smokeless tobacco. He reports that he does not drink alcohol or use drugs.  Family History  Adopted: Yes  Family history unknown: Yes    Medications: Patient's Medications  New Prescriptions   No medications on file  Previous Medications   ACETAMINOPHEN (TYLENOL) 500 MG TABLET    Take 1,000 mg by mouth every 6 (six) hours as needed for headache.    ALBUTEROL (PROVENTIL HFA;VENTOLIN HFA) 108 (90 BASE) MCG/ACT INHALER    Inhale 2 puffs into the lungs every 6 (six) hours as needed for wheezing or shortness of breath.   ALIROCUMAB (PRALUENT) 75 MG/ML SOPN    Inject 75 mg into the skin every 14 (fourteen) days.    ASPIRIN 81 MG EC TABLET    Take 1 tablet (81 mg total) by mouth daily.   BD PEN NEEDLE NANO U/F 32G X 4 MM MISC    USE TWICE DAILY WITH LANTUS PEN   BLOOD GLUCOSE MONITORING SUPPL (ONE TOUCH ULTRA 2) W/DEVICE KIT    Use to test for blood sugar three times daily dx: E11.51   CARVEDILOL (COREG) 6.25 MG TABLET    Take 1/2 tablet by mouth twice daily with meals   FLUTICASONE (FLONASE) 50 MCG/ACT NASAL SPRAY    Place 1 spray into both nostrils daily as needed for allergies or rhinitis.   GABAPENTIN (NEURONTIN) 100 MG CAPSULE    Take 200 mg by mouth at bedtime.    GLUCOSE BLOOD (ONE TOUCH ULTRA TEST) TEST STRIP    Use to test for blood sugar three times daily dx: E11.51   INSULIN GLARGINE (BASAGLAR KWIKPEN) 100 UNIT/ML SOPN    INJECT 40 UNITS SUBCUTANEOUSLY INTO THE SKIN AT BEDTIME   LIFESCAN FINEPOINT LANCETS MISC    Use to test for blood sugar three times daily dx: E11.51   NITROGLYCERIN (NITROSTAT) 0.4 MG SL TABLET    PLACE 1 TABLET (0.4 MG TOTAL) UNDER THE TONGUE EVERY 5 (FIVE) MINUTES AS NEEDED FOR CHEST PAIN.   ONDANSETRON (ZOFRAN ODT) 4 MG DISINTEGRATING TABLET    Take 1 tablet (4 mg total) by mouth every 8 (eight) hours as needed for nausea or vomiting.   PANTOPRAZOLE (PROTONIX) 40 MG TABLET     Take one tablet by mouth daily   PRAMIPEXOLE (MIRAPEX) 1 MG TABLET    TAKE 2 TABLETS BY MOUTH AT BEDTIME.   VALSARTAN (DIOVAN) 80 MG TABLET    Take 80 mg by mouth  daily.  Modified Medications   No medications on file  Discontinued Medications   CONTINUOUS BLOOD GLUC RECEIVER (FREESTYLE LIBRE 14 DAY READER) DEVI    1 each by Does not apply route daily as needed. Use once daily as direct to check blood sugar E11.51   CONTINUOUS BLOOD GLUC SENSOR (FREESTYLE LIBRE 14 DAY SENSOR) MISC    1 each by Does not apply route daily as needed. E11.51     Physical Exam:  Unable to obtain due to televist.   Labs reviewed: Basic Metabolic Panel: Recent Labs    02/05/18 0834 04/20/18 1414 05/28/18 0807  NA 138 138 138  K 4.7 4.1 4.5  CL 103 103 103  CO2 32 24 30  GLUCOSE 177* 130* 135*  BUN _0 CREATININE 1.15 0.98 1.16  CALCIUM 9.7 9.6 9.2   Liver Function Tests: Recent Labs    10/07/17 0840 02/05/18 0834 04/20/18 1414  AST _1 ALT 23 33 35  ALKPHOS  --   --  48  BILITOT 0.7 0.7 1.1  PROT 6.7 6.9 7.2  ALBUMIN  --   --  4.3   Recent Labs    04/20/18 1414  LIPASE 19   No results for input(s): AMMONIA in the last 8760 hours. CBC: Recent Labs    10/07/17 0840 04/20/18 1414  WBC 4.5 8.4  NEUTROABS 2,777  --   HGB 14.5 16.6  HCT 43.0 49.2  MCV 84.5 87.5  PLT 114* 134*   Lipid Panel: Recent Labs    10/07/17 0840 02/05/18 0834  CHOL 167 97  HDL 36* 47  LDLCALC 106* 31  TRIG 139 109  CHOLHDL 4.6 2.1   TSH: No results for input(s): TSH in the last 8760 hours. A1C: Lab Results  Component Value Date   HGBA1C 8.6 (H) 05/28/2018     Assessment/Plan 1. Uncontrolled diabetes mellitus with peripheral circulatory disorder ([place]) -not well controlled but improved from previous. To continue to work on diet. Continue on basaglar 40 units at bedtime. Refill provided for strips.  -will add jardiance 10 mg daily to help with control  - glucose blood (ONE TOUCH  ULTRA TEST) test strip; Use to test for blood sugar three times daily dx: E11.51  Dispense: 300 each; Refill: 1 - Blood Glucose Monitoring Suppl (ONE TOUCH ULTRA 2) w/Device KIT; Use to test for blood sugar three times daily dx: E11.51  Dispense: 1 each; Refill: 0 - LIFESCAN FINEPOINT LANCETS MISC; Use to test for blood sugar three times daily dx: E11.51  Dispense: 300 each; Refill: 1 - empagliflozin (JARDIANCE) 10 MG TABS tablet; Take 10 mg by mouth daily.  Dispense: 30 tablet; Refill: 1 - Hemoglobin A1c  2. Restless legs syndrome (RLS) Ongoing, following with neurology, will continue current regimen.   3. Cough With cough and congestion, may be allergy related due to time of year. Has use claritin in the past, encouraged to restart claritin 10 mg daily (or can use zyrtexc 10 mg daily) Also can use mucinex DM by mouth twice daily for cough and congestion.   4. Essential hypertension Does not take a home, has been well controlled in the past. Continues on valsartan and coreg  5. Coronary artery disease involving native coronary artery of native heart with other form of angina pectoris ([place]) Stable without chest pains. Continues on ASA 81 mg daily  6. Hyperlipidemia, unspecified hyperlipidemia type LDL at goal on last labs, continues on alirocumab injectable every 14  days   7. Gastroesophageal reflux disease without esophagitis Controlled on protonix.    Next appt: 3 months with A1c prior  [name]  [facility] [phone]    Follow Up Instructions:    I discussed the assessment and treatment plan with the patient. The patient was provided an opportunity to ask questions and all were answered. The patient agreed with the plan and demonstrated an understanding of the instructions.   The patient was advised to call back or seek an in-person evaluation if the symptoms worsen or if the condition fails to improve as anticipated.  I provided  24 minutes of non-face-to-face time during this encounter.  avs printed and mailed.   Lauree Chandler, NP

## 2018-06-01 NOTE — Patient Instructions (Addendum)
To use claritin or zyrtec 10 mg by mouth daily   mucinex DM by mouth twice daily-- with full glass of water x 7 days then as needed cough and congestion.  If symptoms worse or fail to improve to call.   To ADD jardiance 10 mg by mouth daily for diabetes.  Continue to eat at regular intervals with proper protein and complex carbohydrates   Decrease Protonix to daily

## 2018-06-08 ENCOUNTER — Other Ambulatory Visit: Payer: Self-pay | Admitting: *Deleted

## 2018-06-08 DIAGNOSIS — J410 Simple chronic bronchitis: Secondary | ICD-10-CM

## 2018-06-08 MED ORDER — ALBUTEROL SULFATE HFA 108 (90 BASE) MCG/ACT IN AERS: 2.0000 | INHALATION_SPRAY | Freq: Four times a day (QID) | RESPIRATORY_TRACT | 1 refills | Status: DC | PRN

## 2018-06-08 NOTE — Telephone Encounter (Signed)
Patient called and requested Rx. Faxed to pharmacy.

## 2018-07-15 DIAGNOSIS — G2581 Restless legs syndrome: Secondary | ICD-10-CM | POA: Diagnosis not present

## 2018-07-15 DIAGNOSIS — G4733 Obstructive sleep apnea (adult) (pediatric): Secondary | ICD-10-CM | POA: Diagnosis not present

## 2018-07-26 ENCOUNTER — Other Ambulatory Visit: Payer: Self-pay | Admitting: Nurse Practitioner

## 2018-07-26 DIAGNOSIS — IMO0002 Reserved for concepts with insufficient information to code with codable children: Secondary | ICD-10-CM

## 2018-07-26 DIAGNOSIS — E1151 Type 2 diabetes mellitus with diabetic peripheral angiopathy without gangrene: Secondary | ICD-10-CM

## 2018-07-30 ENCOUNTER — Other Ambulatory Visit: Payer: Self-pay | Admitting: Cardiology

## 2018-08-20 ENCOUNTER — Other Ambulatory Visit: Payer: Self-pay | Admitting: Internal Medicine

## 2018-08-31 ENCOUNTER — Ambulatory Visit (INDEPENDENT_AMBULATORY_CARE_PROVIDER_SITE_OTHER): Payer: Medicare Other | Admitting: Family

## 2018-08-31 ENCOUNTER — Other Ambulatory Visit: Payer: Self-pay

## 2018-08-31 ENCOUNTER — Encounter: Payer: Self-pay | Admitting: Family

## 2018-08-31 DIAGNOSIS — G2581 Restless legs syndrome: Secondary | ICD-10-CM

## 2018-08-31 NOTE — Patient Instructions (Signed)
Please check with pharmacist which Restless leg syndrome medication was recommended then notify [facility] Neurologist office since previous prescribed medication was expensive.

## 2018-08-31 NOTE — Progress Notes (Signed)
This service is provided via telemedicine  No vital signs collected/recorded due to the encounter was a telemedicine visit.   Location of patient (ex: home, work):  Home   Patient consents to a telephone visit: Yes   Location of the provider (ex: office, home): Office   Name of any referring provider: Dr. Hollace Kinnier   Names of all persons participating in the telemedicine service and their role in the encounter: Trinity Hyland NP, Ruthell Rummage CMA, Jamas Lav Sr   Time spent on call: Ruthell Rummage CMA spent 10 minutes on phone with patient     [facility] clinic  Provider: Marlowe Sax NP  Code Status: FULL Goals of Care:  Advanced Directives 03/02/2018  Does Patient Have a Medical Advance Directive? No  Does patient want to make changes to medical advance directive? -  Would patient like information on creating a medical advance directive? No - Patient declined  Pre-existing out of facility DNR order (yellow form or pink MOST form) -     Chief Complaint  Patient presents with  . Acute Visit    Restless Leg and requesting medication change     HPI: Patient is a 68 y.o. male seen today for an acute visit for change of RLS medication.He states takes Mirapex 1 mg tablet and Gabapentin but has been ineffective.He has had to take mirapex 3 mg and Gabapentin 200 mg tablet which is  effective but affects him in the morning " I just don't feel right" drowsy. He states was seen by Neurologist and new medication was prescribed but did not buy due to cost " I cannot pay $ 360 for then medication".He states pharmacist recommended a different kind of medication but does not recall the medication's name. On chart review he was seen by Neurologist at [facility] Dr. Caprice Beaver 07/15/2018 for Sleep apnea and RLS AutoPAP machine was ordered for sleep apnea.Mirapex was tapered and Neurpro patch initiated.He was also instructed to continue on Gabapentin 200 mg capsule at bedtime.   Past Medical  History:  Diagnosis Date  . Arthritis    "left thumb" (05/23/2017)  . Barrett's esophagus    "we've been told that it's all gone; still take RX for GERD" (05/23/2017)  . CAD (coronary artery disease), native coronary artery    Coronary angiogram 05/03/2014:  Proximal LAD 3.0x12 mm Promus premier stent.  04/08/2014: Mid Cx 3.5 x 16 mm Promus DES.  03/29/2013: Mid LAD 2.75 x 38 mm Promus Premier drug-eluting stent, balloon angioplasty of D1 and distal LAD and stenting of distal RCA with 2.75 x 24 mm promos Premier drug-eluting stent 12/15/2012 widely patent.  . Cervicalgia   . Chronic bronchitis ([place])    "q yr" (05/23/2017)  . Complication of anesthesia    " I do not wake up very well "  . Family history of adverse reaction to anesthesia    "son w/PONV"  . GERD (gastroesophageal reflux disease)   . Heart murmur    "grew out of it"   . Hyperlipidemia   . Hypoglycemia, unspecified   . IDDM (insulin dependent diabetes mellitus) ([place])   . Impotence of organic origin   . Iron deficiency anemia   . Other malaise and fatigue   . PONV (postoperative nausea and vomiting)   . Restless leg   . Tension headache    "sometimes" (05/23/2017)  . Unspecified gastritis and gastroduodenitis with hemorrhage   . Unstable angina pectoris ([place]) 04/08/2014   Coronary angiogram 05/03/2014:  Proximal LAD  3.0x12 mm Promus premier stent.  04/08/2014: Mid Cx 3.5 x 16 mm Promus DES.  03/29/2013: Mid LAD 2.75 x 38 mm Promus Premier drug-eluting stent, balloon angioplasty of D1 and distal LAD and stenting of distal RCA with 2.75 x 24 mm promos Premier drug-eluting stent 12/15/2012 widely patent.    Past Surgical History:  Procedure Laterality Date  . CARDIAC CATHETERIZATION N/A 09/25/2015   Procedure: Left Heart Cath and Coronary Angiography;  Surgeon: Adrian Prows, MD;  Location: [place] CV LAB;  Service: Cardiovascular;  Laterality: N/A;  . CARDIAC CATHETERIZATION N/A 09/25/2015   Procedure: Intravascular Pressure Wire/FFR  Study;  Surgeon: Adrian Prows, MD;  Location: [place] [facility];  Service: Cardiovascular;  Laterality: N/A;  . CORONARY ANGIOPLASTY WITH STENT PLACEMENT  12/15/2012; 04/28/2014; 05/03/2014   "2; 1; 1"   . FRACTIONAL FLOW RESERVE WIRE Right 03/26/2013   Procedure: FRACTIONAL FLOW RESERVE WIRE;  Surgeon: Laverda Page, MD;  Location: [facility] CATH LAB;  Service: Cardiovascular;  Laterality: Right;  . FRACTIONAL FLOW RESERVE WIRE N/A 05/03/2014   Procedure: FRACTIONAL FLOW RESERVE WIRE;  Surgeon: Laverda Page, MD;  Location: [facility] CATH LAB;  Service: Cardiovascular;  Laterality: N/A;  . HERNIA REPAIR  1245   "umbilical"   . INCISION AND DRAINAGE ABSCESS Left 05/2007   "groin"   . KNEE SURGERY Right 1992;  2000   "calcium deposits removed" (12/15/2012)  . LEFT HEART CATH AND CORONARY ANGIOGRAPHY N/A 05/23/2017   Procedure: LEFT HEART CATH AND CORONARY ANGIOGRAPHY;  Surgeon: Adrian Prows, MD;  Location: [place] [facility] LAB;  Service: Cardiovascular;  Laterality: N/A;  . LEFT HEART CATHETERIZATION WITH CORONARY ANGIOGRAM N/A 12/15/2012   Procedure: LEFT HEART CATHETERIZATION WITH CORONARY ANGIOGRAM;  Surgeon: Laverda Page, MD;  Location: [facility] CATH LAB;  Service: Cardiovascular;  Laterality: N/A;  . LEFT HEART CATHETERIZATION WITH CORONARY ANGIOGRAM N/A 03/26/2013   Procedure: LEFT HEART CATHETERIZATION WITH CORONARY ANGIOGRAM;  Surgeon: Laverda Page, MD;  Location: [facility] CATH LAB;  Service: Cardiovascular;  Laterality: N/A;  . LEFT HEART CATHETERIZATION WITH CORONARY ANGIOGRAM N/A 04/08/2014   Procedure: LEFT HEART CATHETERIZATION WITH CORONARY ANGIOGRAM;  Surgeon: Blane Ohara, MD;  Location: [facility] CATH LAB;  Service: Cardiovascular;  Laterality: N/A;  . PERCUTANEOUS CORONARY STENT INTERVENTION (PCI-S) N/A 05/03/2014   Procedure: PERCUTANEOUS CORONARY STENT INTERVENTION (PCI-S);  Surgeon: Laverda Page, MD;  Location: [facility] CATH LAB;  Service: Cardiovascular;  Laterality: N/A;  . UMBILICAL HERNIA REPAIR       Allergies  Allergen Reactions  . Lyrica [Pregabalin] Other (See Comments)    Made patient very lethargic the next morning, very hard to patient to function, move, etc.   . Statins Other (See Comments)    Causes Restless Legs, rhabdomyolysis  . Metformin And Related     GI upset  . Trulicity [Dulaglutide] Other (See Comments)    GI side effects     Outpatient Encounter Medications as of 08/31/2018  Medication Sig  . acetaminophen (TYLENOL) 500 MG tablet Take 1,000 mg by mouth every 6 (six) hours as needed for headache.   . albuterol (PROVENTIL HFA;VENTOLIN HFA) 108 (90 Base) MCG/ACT inhaler Inhale 2 puffs into the lungs every 6 (six) hours as needed for wheezing or shortness of breath.  . Alirocumab (PRALUENT) 75 MG/ML SOPN Inject 75 mg into the skin every 14 (fourteen) days.   Marland Kitchen aspirin 81 MG EC tablet Take 1 tablet (81 mg total) by mouth daily.  . BD PEN NEEDLE NANO  U/F 32G X 4 MM MISC USE TWICE DAILY WITH LANTUS PEN  . Blood Glucose Monitoring Suppl (ONE TOUCH ULTRA 2) w/Device KIT Use to test for blood sugar three times daily dx: E11.51  . carvedilol (COREG) 6.25 MG tablet TAKE 1/2 TABLET BY MOUTH TWICE DAILY WITH MEALS  . fluticasone (FLONASE) 50 MCG/ACT nasal spray Place 1 spray into both nostrils daily as needed for allergies or rhinitis.  Marland Kitchen gabapentin (NEURONTIN) 100 MG capsule Take 200 mg by mouth at bedtime.   Marland Kitchen glucose blood (ONE TOUCH ULTRA TEST) test strip Use to test for blood sugar three times daily dx: E11.51  . Insulin Glargine (BASAGLAR KWIKPEN) 100 UNIT/ML SOPN INJECT 40 UNITS SUBCUTANEOUSLY INTO THE SKIN AT BEDTIME  . JARDIANCE 10 MG TABS tablet TAKE 10 MG BY MOUTH DAILY.  Marland Kitchen LIFESCAN FINEPOINT LANCETS MISC Use to test for blood sugar three times daily dx: E11.51  . nitroGLYCERIN (NITROSTAT) 0.4 MG SL tablet PLACE 1 TABLET (0.4 MG TOTAL) UNDER THE TONGUE EVERY 5 (FIVE) MINUTES AS NEEDED FOR CHEST PAIN.  Marland Kitchen ondansetron (ZOFRAN ODT) 4 MG disintegrating tablet Take 1  tablet (4 mg total) by mouth every 8 (eight) hours as needed for nausea or vomiting.  . pantoprazole (PROTONIX) 40 MG tablet TAKE ONE TABLET BY MOUTH TWICE DAILY FOR STOMACH  . pramipexole (MIRAPEX) 1 MG tablet TAKE 2 TABLETS BY MOUTH AT BEDTIME  . valsartan (DIOVAN) 80 MG tablet Take 80 mg by mouth daily.   No facility-administered encounter medications on file as of 08/31/2018.     Review of Systems:  Review of Systems  Constitutional: Negative for chills, fatigue and fever.  HENT: Negative for congestion, sinus pressure, sinus pain, sneezing and sore throat.   Respiratory: Negative for cough, chest tightness, shortness of breath and wheezing.   Cardiovascular: Negative for chest pain, palpitations and leg swelling.  Musculoskeletal: Negative for myalgias.       Chronic RLS.He takes mirapex 1 mg and Gabapentin 100 mg ineffective but can mirapex 3 mg and Gabapentin 200 mg tablet effective but affects him in the morning.   Neurological: Negative for dizziness, weakness, light-headedness, numbness and headaches.    Health Maintenance  Topic Date Due  . Hepatitis C Screening  1950/11/04  . PNA vac Low Risk Adult (2 of 2 - PPSV23) 12/02/2014  . FOOT EXAM  05/23/2018  . INFLUENZA VACCINE  10/03/2018  . HEMOGLOBIN A1C  11/28/2018  . OPHTHALMOLOGY EXAM  03/20/2019  . COLONOSCOPY  10/18/2025  . TETANUS/TDAP  03/02/2027    Physical Exam: There were no vitals filed for this visit. There is no height or weight on file to calculate BMI. Physical Exam Unable to complete on Telephone visit.  Labs reviewed: Basic Metabolic Panel: Recent Labs    02/05/18 0834 04/20/18 1414 05/28/18 0807  NA 138 138 138  K 4.7 4.1 4.5  CL 103 103 103  CO2 32 24 30  GLUCOSE 177* 130* 135*  BUN _0 CREATININE 1.15 0.98 1.16  CALCIUM 9.7 9.6 9.2   Liver Function Tests: Recent Labs    10/07/17 0840 02/05/18 0834 04/20/18 1414  AST _1 ALT 23 33 35  ALKPHOS  --   --  48  BILITOT  0.7 0.7 1.1  PROT 6.7 6.9 7.2  ALBUMIN  --   --  4.3   Recent Labs    04/20/18 1414  LIPASE 19   CBC: Recent Labs    10/07/17 0840 04/20/18  1414  WBC 4.5 8.4  NEUTROABS 2,777  --   HGB 14.5 16.6  HCT 43.0 49.2  MCV 84.5 87.5  PLT 114* 134*   Lipid Panel: Recent Labs    10/07/17 0840 02/05/18 0834  CHOL 167 97  HDL 36* 47  LDLCALC 106* 31  TRIG 139 109  CHOLHDL 4.6 2.1   Lab Results  Component Value Date   HGBA1C 8.6 (H) 05/28/2018    Procedures since last visit:  Assessment/Plan   Restless legs syndrome (RLS) Chronic symptoms persist.Neurpro patch ordered by Neurologist but patient states unable to pay $ 360.Was also instructed to wean off Mirapex and continue on Gabapentin 200 mg tablet at bedtime.Patient will check with Pharmacist which cheaper medication that is covered by his insurance then will contact his Neurologist office.   Labs/tests ordered: None  Next appt:  09/15/2018  Time spent with patient 11 minutes >50% time spent counseling; reviewing medical record; tests; labs; and developing future plan of care

## 2018-09-15 ENCOUNTER — Other Ambulatory Visit: Payer: Self-pay

## 2018-09-15 ENCOUNTER — Other Ambulatory Visit: Payer: Medicare Other

## 2018-09-15 DIAGNOSIS — E1165 Type 2 diabetes mellitus with hyperglycemia: Secondary | ICD-10-CM | POA: Diagnosis not present

## 2018-09-15 DIAGNOSIS — E1151 Type 2 diabetes mellitus with diabetic peripheral angiopathy without gangrene: Secondary | ICD-10-CM

## 2018-09-15 DIAGNOSIS — IMO0002 Reserved for concepts with insufficient information to code with codable children: Secondary | ICD-10-CM

## 2018-09-16 LAB — HEMOGLOBIN A1C
Hgb A1c MFr Bld: 8.3 % of total Hgb — ABNORMAL HIGH (ref <5.7)
Mean Plasma Glucose: 192 (calc)
eAG (mmol/L): 10.6 (calc)

## 2018-09-17 ENCOUNTER — Ambulatory Visit: Payer: Medicare Other | Admitting: Internal Medicine

## 2018-09-24 ENCOUNTER — Other Ambulatory Visit: Payer: Self-pay

## 2018-09-24 ENCOUNTER — Ambulatory Visit (INDEPENDENT_AMBULATORY_CARE_PROVIDER_SITE_OTHER): Payer: Medicare Other | Admitting: Internal Medicine

## 2018-09-24 ENCOUNTER — Encounter: Payer: Self-pay | Admitting: Internal Medicine

## 2018-09-24 VITALS — BP 122/70 | HR 62 | Temp 97.8°F | Ht 68.0 in | Wt 184.0 lb

## 2018-09-24 DIAGNOSIS — I25118 Atherosclerotic heart disease of native coronary artery with other forms of angina pectoris: Secondary | ICD-10-CM | POA: Diagnosis not present

## 2018-09-24 DIAGNOSIS — E1165 Type 2 diabetes mellitus with hyperglycemia: Secondary | ICD-10-CM | POA: Diagnosis not present

## 2018-09-24 DIAGNOSIS — G2581 Restless legs syndrome: Secondary | ICD-10-CM

## 2018-09-24 DIAGNOSIS — E1151 Type 2 diabetes mellitus with diabetic peripheral angiopathy without gangrene: Secondary | ICD-10-CM

## 2018-09-24 DIAGNOSIS — I10 Essential (primary) hypertension: Secondary | ICD-10-CM | POA: Diagnosis not present

## 2018-09-24 DIAGNOSIS — E611 Iron deficiency: Secondary | ICD-10-CM

## 2018-09-24 DIAGNOSIS — IMO0002 Reserved for concepts with insufficient information to code with codable children: Secondary | ICD-10-CM

## 2018-09-24 DIAGNOSIS — J410 Simple chronic bronchitis: Secondary | ICD-10-CM

## 2018-09-24 NOTE — Progress Notes (Signed)
Location:  [place] clinic   Advanced Directives 03/02/2018  Does Patient Have a Medical Advance Directive? No  Does patient want to make changes to medical advance directive? -  Would patient like information on creating a medical advance directive? No - Patient declined  Pre-existing out of facility DNR order (yellow form or pink MOST form) -     Chief Complaint  Patient presents with  . Medical Management of Chronic Issues    3MTH FOLLOW-UP    HPI: Patient is a 68 y.o. male seen today for medical management of chronic diseases.    Patient presents with a main complaint of restless leg syndrome. He is being followed by neurology. His last visit included a change of his medications. Neupro patches and gabapentin were started in an effort to wean off the mirapex. The patient could not afford the Neupro patches and notifed the neurology office. The gabapentin made the patient drowsy in the AM. He is currently taking his Mirapex 55m at bedtime and gabapentin 100 mg if needed. He does not have a scheduled follow up with neurology.   He has a history of diabetes. He states his diet is poor and eats high carbohydrate meals. 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 He is taking his Jardiance daily and checking his blood glucose a few times a week. No episodes of hypoglycemia reported.   He has a extensive cardiac history associated with coronary artery disease. He is being followed by Dr. GEinar Gip He has not had any recent incidents of chest pain. He tries to follow a diet low in sodium and fats, but has trouble on a daily basis.    Past Medical History:  Diagnosis Date  . Arthritis    "left thumb" (05/23/2017)  . Barrett's esophagus    "we've been told that it's all gone; still take RX for GERD" (05/23/2017)  . CAD (coronary artery disease), native coronary artery    Coronary angiogram 05/03/2014:  Proximal LAD 3.0x12 mm Promus premier stent.  04/08/2014:  Mid Cx 3.5 x 16 mm Promus DES.  03/29/2013: Mid LAD 2.75 x 38 mm Promus Premier drug-eluting stent, balloon angioplasty of D1 and distal LAD and stenting of distal RCA with 2.75 x 24 mm promos Premier drug-eluting stent 12/15/2012 widely patent.  . Cervicalgia   . [place] bronchitis ([place]    "q yr" (05/23/2017)  . Complication of anesthesia    " I do not wake up very well "  . Family history of adverse reaction to anesthesia    "son w/PONV"  . GERD (gastroesophageal reflux disease)   . Heart murmur    "grew out of it"   . Hyperlipidemia   . Hypoglycemia, unspecified   . IDDM (insulin dependent diabetes mellitus) ([place]   . Impotence of organic origin   . Iron deficiency anemia   . Other malaise and fatigue   . PONV (postoperative nausea and vomiting)   . Restless leg   . Tension headache    "sometimes" (05/23/2017)  . Unspecified gastritis and gastroduodenitis with hemorrhage   . Unstable angina pectoris ([place] 04/08/2014   Coronary angiogram 05/03/2014:  Proximal LAD 3.0x12 mm Promus premier stent.  04/08/2014: Mid Cx 3.5 x 16 mm Promus DES.  03/29/2013: Mid LAD 2.75 x 38 mm Promus Premier drug-eluting stent, balloon angioplasty of D1 and distal LAD and stenting of distal RCA with 2.75 x 24 mm promos Premier drug-eluting stent 12/15/2012 widely  patent.    Past Surgical History:  Procedure Laterality Date  . CARDIAC CATHETERIZATION N/A 09/25/2015   Procedure: Left Heart Cath and Coronary Angiography;  Surgeon: Adrian Prows, MD;  Location: [place] [facility];  Service: Cardiovascular;  Laterality: N/A;  . CARDIAC CATHETERIZATION N/A 09/25/2015   Procedure: Intravascular Pressure Wire/FFR Study;  Surgeon: Adrian Prows, MD;  Location: [place] [facility];  Service: Cardiovascular;  Laterality: N/A;  . CORONARY ANGIOPLASTY WITH STENT PLACEMENT  12/15/2012; 04/28/2014; 05/03/2014   "2; 1; 1"   . FRACTIONAL FLOW RESERVE WIRE Right 03/26/2013   Procedure: FRACTIONAL FLOW RESERVE WIRE;  Surgeon: Laverda Page,  MD;  Location: [facility] CATH LAB;  Service: Cardiovascular;  Laterality: Right;  . FRACTIONAL FLOW RESERVE WIRE N/A 05/03/2014   Procedure: FRACTIONAL FLOW RESERVE WIRE;  Surgeon: Laverda Page, MD;  Location: [facility] CATH LAB;  Service: Cardiovascular;  Laterality: N/A;  . HERNIA REPAIR  6144   "umbilical"   . INCISION AND DRAINAGE ABSCESS Left 05/2007   "groin"   . KNEE SURGERY Right 1992;  2000   "calcium deposits removed" (12/15/2012)  . LEFT HEART CATH AND CORONARY ANGIOGRAPHY N/A 05/23/2017   Procedure: LEFT HEART CATH AND CORONARY ANGIOGRAPHY;  Surgeon: Adrian Prows, MD;  Location: [place] [facility] LAB;  Service: Cardiovascular;  Laterality: N/A;  . LEFT HEART CATHETERIZATION WITH CORONARY ANGIOGRAM N/A 12/15/2012   Procedure: LEFT HEART CATHETERIZATION WITH CORONARY ANGIOGRAM;  Surgeon: Laverda Page, MD;  Location[facility] CATH LAB;  Service: Cardiovascular;  Laterality: N/A;  . LEFT HEART CATHETERIZATION WITH CORONARY ANGIOGRAM N/A 03/26/2013   Procedure: LEFT HEART CATHETERIZATION WITH CORONARY ANGIOGRAM;  Surgeon: Laverda Page, MD;  Location: [facility] CATH LAB;  Service: Cardiovascular;  Laterality: N/A;  . LEFT HEART CATHETERIZATION WITH CORONARY ANGIOGRAM N/A 04/08/2014   Procedure: LEFT HEART CATHETERIZATION WITH CORONARY ANGIOGRAM;  Surgeon: Blane Ohara, MD;  Location: [facility] CATH LAB;  Service: Cardiovascular;  Laterality: N/A;  . PERCUTANEOUS CORONARY STENT INTERVENTION (PCI-S) N/A 05/03/2014   Procedure: PERCUTANEOUS CORONARY STENT INTERVENTION (PCI-S);  Surgeon: Laverda Page, MD;  Location: [facility] CATH LAB;  Service: Cardiovascular;  Laterality: N/A;  . UMBILICAL HERNIA REPAIR      Allergies  Allergen Reactions  . Lyrica [Pregabalin] Other (See Comments)    Made patient very lethargic the next morning, very hard to patient to function, move, etc.   . Statins Other (See Comments)    Causes Restless Legs, rhabdomyolysis  . Metformin And Related     GI upset  . Trulicity [Dulaglutide]  Other (See Comments)    GI side effects     Outpatient Encounter Medications as of 09/24/2018  Medication Sig  . acetaminophen (TYLENOL) 500 MG tablet Take 1,000 mg by mouth every 6 (six) hours as needed for headache.   . albuterol (PROVENTIL HFA;VENTOLIN HFA) 108 (90 Base) MCG/ACT inhaler Inhale 2 puffs into the lungs every 6 (six) hours as needed for wheezing or shortness of breath.  . Alirocumab (PRALUENT) 75 MG/ML SOPN Inject 75 mg into the skin every 14 (fourteen) days.   Marland Kitchen aspirin 81 MG EC tablet Take 1 tablet (81 mg total) by mouth daily.  . BD PEN NEEDLE NANO U/F 32G X 4 MM MISC USE TWICE DAILY WITH LANTUS PEN  . carvedilol (COREG) 6.25 MG tablet TAKE 1/2 TABLET BY MOUTH TWICE DAILY WITH MEALS  . fluticasone (FLONASE) 50 MCG/ACT nasal spray Place 1 spray into both nostrils daily as needed for allergies or rhinitis.  Marland Kitchen  gabapentin (NEURONTIN) 100 MG capsule Take 200 mg by mouth at bedtime.   Marland Kitchen glucose blood (ONE TOUCH ULTRA TEST) test strip Use to test for blood sugar three times daily dx: E11.51  . Insulin Glargine (BASAGLAR KWIKPEN) 100 UNIT/ML SOPN INJECT 40 UNITS SUBCUTANEOUSLY INTO THE SKIN AT BEDTIME  . JARDIANCE 10 MG TABS tablet TAKE 10 MG BY MOUTH DAILY.  Marland Kitchen LIFESCAN FINEPOINT LANCETS MISC Use to test for blood sugar three times daily dx: E11.51  . nitroGLYCERIN (NITROSTAT) 0.4 MG SL tablet PLACE 1 TABLET (0.4 MG TOTAL) UNDER THE TONGUE EVERY 5 (FIVE) MINUTES AS NEEDED FOR CHEST PAIN.  Marland Kitchen pantoprazole (PROTONIX) 40 MG tablet TAKE ONE TABLET BY MOUTH TWICE DAILY FOR STOMACH  . pramipexole (MIRAPEX) 1 MG tablet TAKE 2 TABLETS BY MOUTH AT BEDTIME  . valsartan (DIOVAN) 80 MG tablet Take 80 mg by mouth daily.  . [DISCONTINUED] Blood Glucose Monitoring Suppl (ONE TOUCH ULTRA 2) w/Device KIT Use to test for blood sugar three times daily dx: E11.51   No facility-administered encounter medications on file as of 09/24/2018.     Review of Systems:  Review of Systems  Constitutional:  Negative for appetite change, fatigue and unexpected weight change.  HENT: Negative for dental problem, hearing loss, trouble swallowing and voice change.   Eyes: Negative for photophobia and visual disturbance.  Respiratory: Negative for cough, shortness of breath and wheezing.   Cardiovascular: Negative for chest pain, palpitations and leg swelling.  Gastrointestinal: Positive for constipation. Negative for diarrhea and nausea.  Endocrine: Positive for polydipsia and polyuria. Negative for polyphagia.  Genitourinary: Positive for frequency. Negative for decreased urine volume, dysuria and hematuria.  Musculoskeletal: Positive for arthralgias. Negative for back pain and gait problem.  Skin: Negative.   Neurological: Negative for dizziness, syncope, weakness and headaches.  Psychiatric/Behavioral: Negative for agitation, behavioral problems and sleep disturbance.    Health Maintenance  Topic Date Due  . Hepatitis C Screening  1950/08/21  . PNA vac Low Risk Adult (2 of 2 - PPSV23) 12/02/2014  . FOOT EXAM  05/23/2018  . INFLUENZA VACCINE  10/03/2018  . HEMOGLOBIN A1C  03/18/2019  . OPHTHALMOLOGY EXAM  03/20/2019  . COLONOSCOPY  10/18/2025  . TETANUS/TDAP  03/02/2027    Physical Exam: Vitals:   09/24/18 1050  BP: 122/70  Pulse: 62  Temp: 97.8 F (36.6 C)  TempSrc: Oral  SpO2: 96%  Weight: 184 lb (83.5 kg)  Height: 5' 8"  (1.727 m)   Body mass index is 27.98 kg/m. Physical Exam Vitals signs reviewed.  Constitutional:      General: He is not in acute distress.    Appearance: Normal appearance. He is normal weight. He is not ill-appearing.  HENT:     Head: Normocephalic.     Mouth/Throat:     Mouth: Mucous membranes are dry.  Cardiovascular:     Rate and Rhythm: Normal rate and regular rhythm.     Pulses: Normal pulses.     Heart sounds: Normal heart sounds. No murmur.  Pulmonary:     Effort: Pulmonary effort is normal. No respiratory distress.     Breath sounds:  Normal breath sounds. No wheezing.  Abdominal:     General: Abdomen is flat.     Palpations: Abdomen is soft.     Tenderness: There is no abdominal tenderness.  Musculoskeletal: Normal range of motion.     Right lower leg: Edema present.     Left lower leg: No edema.  Feet:  Comments: Monofilament test 10/10 on Left foot- anterior/posterior. 1010/ right foot anterior-posterior.  No visible ulcers or skin discoloration on BLE.  Skin:    General: Skin is warm and dry.     Capillary Refill: Capillary refill takes 2 to 3 seconds.  Neurological:     General: No focal deficit present.     Mental Status: He is alert and oriented to person, place, and time. Mental status is at baseline.  Psychiatric:        Mood and Affect: Mood normal.        Behavior: Behavior normal.        Thought Content: Thought content normal.        Judgment: Judgment normal.     Labs reviewed: Basic Metabolic Panel: Recent Labs    02/05/18 0834 04/20/18 1414 05/28/18 0807  NA 138 138 138  K 4.7 4.1 4.5  CL 103 103 103  CO2 32 24 30  GLUCOSE 177* 130* 135*  BUN 21 11 18   CREATININE 1.15 0.98 1.16  CALCIUM 9.7 9.6 9.2   Liver Function Tests: Recent Labs    10/07/17 0840 02/05/18 0834 04/20/18 1414  AST 18 21 24   ALT 23 33 35  ALKPHOS  --   --  48  BILITOT 0.7 0.7 1.1  PROT 6.7 6.9 7.2  ALBUMIN  --   --  4.3   Recent Labs    04/20/18 1414  LIPASE 19   No results for input(s): AMMONIA in the last 8760 hours. CBC: Recent Labs    10/07/17 0840 04/20/18 1414  WBC 4.5 8.4  NEUTROABS 2,777  --   HGB 14.5 16.6  HCT 43.0 49.2  MCV 84.5 87.5  PLT 114* 134*   Lipid Panel: Recent Labs    10/07/17 0840 02/05/18 0834  CHOL 167 97  HDL 36* 47  LDLCALC 106* 31  TRIG 139 109  CHOLHDL 4.6 2.1   Lab Results  Component Value Date   HGBA1C 8.3 (H) 09/15/2018    Procedures since last visit: No results found.  Assessment/Plan 1. Restless legs syndrome (RLS) - unchanged from last  visit - ferritin level to rule out iron deficiency causing RLS -Neupro patch patient assistance program not available for patient due to current insurance plans - recommend following up with neurology for medication adjustment  2. Uncontrolled diabetes mellitus with peripheral circulatory disorder (HCC) - Jardiance 10 mg daily has reduced hemoglobin A1C from 8.6 to 8.3 - encourage diet low in sugars and carbs - encourage checking blood glucose three times a day and record - schedule eye exam  - microalbumin- future - hemoglobin A1C- future -CMP with GFR- future  3. Essential hypertension - bp is stable, continue current bp regimen -encourage diet low in sodium  4. Coronary artery disease involving native coronary artery of native heart with other form of angina pectoris ([place]) - no incidents of chest pain - stable, with no progression - continue ASA 81 mg daily  5. Hyperlipidemia, unspecified - continue praluent injections every 2 weeks - lipid panel - future         Labs/tests ordered: Today: ferritin level Future: CBC with differential/platelets, CMP with GFR, lipid panel, hemoglobin A1C Next appt:  4 month follow up

## 2018-09-25 LAB — FERRITIN: Ferritin: 45 ng/mL (ref 24–380)

## 2018-10-17 ENCOUNTER — Other Ambulatory Visit: Payer: Self-pay | Admitting: Internal Medicine

## 2018-10-17 DIAGNOSIS — E1151 Type 2 diabetes mellitus with diabetic peripheral angiopathy without gangrene: Secondary | ICD-10-CM

## 2018-10-17 DIAGNOSIS — IMO0002 Reserved for concepts with insufficient information to code with codable children: Secondary | ICD-10-CM

## 2018-11-18 ENCOUNTER — Other Ambulatory Visit: Payer: Self-pay | Admitting: Internal Medicine

## 2018-11-18 DIAGNOSIS — G2581 Restless legs syndrome: Secondary | ICD-10-CM

## 2018-12-04 ENCOUNTER — Other Ambulatory Visit: Payer: Self-pay | Admitting: Nurse Practitioner

## 2018-12-04 DIAGNOSIS — E1151 Type 2 diabetes mellitus with diabetic peripheral angiopathy without gangrene: Secondary | ICD-10-CM

## 2018-12-04 DIAGNOSIS — IMO0002 Reserved for concepts with insufficient information to code with codable children: Secondary | ICD-10-CM

## 2018-12-24 ENCOUNTER — Other Ambulatory Visit: Payer: Self-pay

## 2018-12-24 ENCOUNTER — Encounter: Payer: Self-pay | Admitting: Cardiology

## 2018-12-24 ENCOUNTER — Ambulatory Visit (INDEPENDENT_AMBULATORY_CARE_PROVIDER_SITE_OTHER): Payer: Medicare Other | Admitting: Cardiology

## 2018-12-24 VITALS — BP 130/76 | HR 61 | Ht 68.0 in | Wt 183.0 lb

## 2018-12-24 DIAGNOSIS — I10 Essential (primary) hypertension: Secondary | ICD-10-CM | POA: Diagnosis not present

## 2018-12-24 DIAGNOSIS — E1159 Type 2 diabetes mellitus with other circulatory complications: Secondary | ICD-10-CM | POA: Diagnosis not present

## 2018-12-24 DIAGNOSIS — E1165 Type 2 diabetes mellitus with hyperglycemia: Secondary | ICD-10-CM | POA: Diagnosis not present

## 2018-12-24 DIAGNOSIS — E7800 Pure hypercholesterolemia, unspecified: Secondary | ICD-10-CM

## 2018-12-24 DIAGNOSIS — I25118 Atherosclerotic heart disease of native coronary artery with other forms of angina pectoris: Secondary | ICD-10-CM | POA: Diagnosis not present

## 2018-12-24 DIAGNOSIS — Z794 Long term (current) use of insulin: Secondary | ICD-10-CM

## 2018-12-24 DIAGNOSIS — Z789 Other specified health status: Secondary | ICD-10-CM | POA: Diagnosis not present

## 2018-12-24 DIAGNOSIS — D696 Thrombocytopenia, unspecified: Secondary | ICD-10-CM | POA: Diagnosis not present

## 2018-12-24 DIAGNOSIS — IMO0002 Reserved for concepts with insufficient information to code with codable children: Secondary | ICD-10-CM

## 2018-12-24 NOTE — Progress Notes (Signed)
Primary Physician/Referring:  Kermit Balo, DO  Patient ID: Kenneth Sic Sr., male    DOB: 07/15/1950, 68 y.o.   MRN: [id number]  No chief complaint on file.  HPI:    Kenneth Vestal Sr.  is a 68 y.o. caucasian male with history of known coronary artery disease. Due to recurrent episodes of shortness of breath and chest pain, he underwent coronary angiography on 09/25/2015 revealing widely patent stents.  (H/O PCI to promimal LAD with 3x18 mm DES and to Cx/OM1 with 3.5 x 16 mm Promus DES on 04/08/2014. He has h/o Mid LAD 2.75 x 38 mm Promus Premier drug-eluting stent, balloon angioplasty of D1 and distal LAD(03/29/2013) and Distal RCA stent (12/15/2012) with 2.75 x 24 mm promos Premier drug-eluting stents).  Past medical history significant for hypertension, hyperlipidemia, Statin intolerance, uncontrolled diabetes mellitus and also severe restless leg syndrome.  He has an episode of chest pain about a week ago while he was practicing singing, resolved spontaneously after about also, states that it as mild enough that he did not take nitroglycerin.  Otherwise he has not had any dyspnea, palpitations dizziness or syncope.  Past Medical History:  Diagnosis Date  . Arthritis    "left thumb" (05/23/2017)  . Barrett's esophagus    "we've been told that it's all gone; still take RX for GERD" (05/23/2017)  . CAD (coronary artery disease), native coronary artery    Coronary angiogram 05/03/2014:  Proximal LAD 3.0x12 mm Promus premier stent.  04/08/2014: Mid Cx 3.5 x 16 mm Promus DES.  03/29/2013: Mid LAD 2.75 x 38 mm Promus Premier drug-eluting stent, balloon angioplasty of D1 and distal LAD and stenting of distal RCA with 2.75 x 24 mm promos Premier drug-eluting stent 12/15/2012 widely patent.  . Cervicalgia   . Chronic bronchitis (HCC)    "q yr" (05/23/2017)  . Complication of anesthesia    " I do not wake up very well "  . Family history of adverse reaction to anesthesia    "son w/PONV"  . GERD  (gastroesophageal reflux disease)   . Heart murmur    "grew out of it"   . Hyperlipidemia   . Hypoglycemia, unspecified   . IDDM (insulin dependent diabetes mellitus) (HCC)   . Impotence of organic origin   . Iron deficiency anemia   . Other malaise and fatigue   . PONV (postoperative nausea and vomiting)   . Restless leg   . Tension headache    "sometimes" (05/23/2017)  . Unspecified gastritis and gastroduodenitis with hemorrhage   . Unstable angina pectoris (HCC) 04/08/2014   Coronary angiogram 05/03/2014:  Proximal LAD 3.0x12 mm Promus premier stent.  04/08/2014: Mid Cx 3.5 x 16 mm Promus DES.  03/29/2013: Mid LAD 2.75 x 38 mm Promus Premier drug-eluting stent, balloon angioplasty of D1 and distal LAD and stenting of distal RCA with 2.75 x 24 mm promos Premier drug-eluting stent 12/15/2012 widely patent.   Past Surgical History:  Procedure Laterality Date  . CARDIAC CATHETERIZATION N/A 09/25/2015   Procedure: Left Heart Cath and Coronary Angiography;  Surgeon: [name], MD;  Location: [facility] INVASIVE CV LAB;  Service: Cardiovascular;  Laterality: N/A;  . CARDIAC CATHETERIZATION N/A 09/25/2015   Procedure: Intravascular Pressure Wire/FFR Study;  Surgeon: [name], MD;  Location: [facility] INVASIVE CV LAB;  Service: Cardiovascular;  Laterality: N/A;  . CORONARY ANGIOPLASTY WITH STENT PLACEMENT  12/15/2012; 04/28/2014; 05/03/2014   "2; 1; 1"   . FRACTIONAL FLOW RESERVE WIRE Right 03/26/2013  Procedure: FRACTIONAL FLOW RESERVE WIRE;  Surgeon: Pamella PertJagadeesh R Claudell Rhody, MD;  [facility];  Service: Cardiovascular;  Laterality: Right;  . FRACTIONAL FLOW RESERVE WIRE N/A 05/03/2014   Procedure: FRACTIONAL FLOW RESERVE WIRE;  Surgeon: Pamella PertJagadeesh R Tamiko Leopard, MD;  [facility];  Service: Cardiovascular;  Laterality: N/A;  . HERNIA REPAIR  2010   "umbilical"   . INCISION AND DRAINAGE ABSCESS Left 05/2007   "groin"   . KNEE SURGERY Right 1992;  2000   "calcium deposits removed" (12/15/2012)  . LEFT HEART CATH  AND CORONARY ANGIOGRAPHY N/A 05/23/2017   Procedure: LEFT HEART CATH AND CORONARY ANGIOGRAPHY;  Surgeon: Yates DecampGanji, [place] W[place]lick, MD;  Location: [facility] INVASIVE CV LAB;  Service: Cardiovascular;  Laterality: N/A;  . LEFT HEART CATHETERIZATION WITH CORONARY ANGIOGRAM N/A 12/15/2012   Procedure: LEFT HEART CATHETERIZATION WITH CORONARY ANGIOGRAM;  Surgeon: Pamella PertJagadeesh R Kyrene Longan, MD;  [facility];  Service: Cardiovascular;  Laterality: N/A;  . LEFT HEART CATHETERIZATION WITH CORONARY ANGIOGRAM N/A 03/26/2013   Procedure: LEFT HEART CATHETERIZATION WITH CORONARY ANGIOGRAM;  Surgeon: Pamella PertJagadeesh R Delorice Bannister, MD;  [facility];  Service: Cardiovascular;  Laterality: N/A;  . LEFT HEART CATHETERIZATION WITH CORONARY ANGIOGRAM N/A 04/08/2014   Procedure: LEFT HEART CATHETERIZATION WITH CORONARY ANGIOGRAM;  Surgeon: Micheline ChapmanMichael D Cooper, MD;  [facility];  Service: Cardiovascular;  Laterality: N/A;  . PERCUTANEOUS CORONARY STENT INTERVENTION (PCI-S) N/A 05/03/2014   Procedure: PERCUTANEOUS CORONARY STENT INTERVENTION (PCI-S);  Surgeon: Pamella PertJagadeesh R Kyara Boxer, MD;  [facility];  Service: Cardiovascular;  Laterality: N/A;  . UMBILICAL HERNIA REPAIR     Social History   Socioeconomic History  . Marital status: Married    Spouse name: Not on file  . Number of children: Not on file  . Years of education: Not on file  . Highest education level: Not on file  Occupational History  . Not on file  Social Needs  . Financial resource strain: Not hard at all  . Food insecurity    Worry: Never true    Inability: Never true  . Transportation needs    Medical: No    Non-medical: No  Tobacco Use  . Smoking status: Never Smoker  . Smokeless tobacco: Never Used  Substance and Sexual Activity  . Alcohol use: No  . Drug use: No  . Sexual activity: Not Currently  Lifestyle  . Physical activity    Days per week: 0 days    Minutes per session: 0 min  . Stress: Only a little  Relationships  . Social connections     Talks on phone: More than three times a week    Gets together: More than three times a week    Attends religious service: More than 4 times per year    Active member of club or organization: No    Attends meetings of clubs or organizations: Never    Relationship status: Married  . Intimate partner violence    Fear of current or ex partner: No    Emotionally abused: No    Physically abused: No    Forced sexual activity: No  Other Topics Concern  . Not on file  Social History Narrative  . Not on file   ROS  Review of Systems  Constitution: Negative for chills, decreased appetite, malaise/fatigue and weight gain.  Cardiovascular: Positive for chest pain and leg swelling (right leg chronic). Negative for dyspnea on exertion and syncope.  Endocrine: Negative for cold intolerance.  Hematologic/Lymphatic:  Does not bruise/bleed easily.  Musculoskeletal: Positive for muscle cramps (has severe restless legs). Negative for joint swelling.  Gastrointestinal: Negative for abdominal pain, anorexia, change in bowel habit, hematochezia and melena.  Neurological: Positive for paresthesias (bilateral feet) and tremors. Negative for headaches and light-headedness.  Psychiatric/Behavioral: Negative for depression and substance abuse.  All other systems reviewed and are negative.  Objective   Vitals with BMI 09/24/2018 04/20/2018 04/20/2018  Height 5\' 8"  - -  Weight 184 lbs - -  BMI 64.40 - -  Systolic 347 425 956  Diastolic 70 62 94  Pulse 62 82 76    There were no vitals taken for this visit. There is no height or weight on file to calculate BMI.   Physical Exam  Constitutional: He appears well-developed and well-nourished.  HENT:  Head: Atraumatic.  Eyes: Conjunctivae are normal.  Neck: Neck supple. No JVD present. No thyromegaly present.  Cardiovascular: Normal rate, regular rhythm and normal heart sounds. Exam reveals no gallop.  No murmur heard. Pulses:      Carotid pulses are 2+  on the right side and 2+ on the left side.      Femoral pulses are 2+ on the right side and 2+ on the left side.      Dorsalis pedis pulses are 1+ on the right side and 1+ on the left side.       Posterior tibial pulses are 1+ on the right side and 1+ on the left side.  There is mild  pigmentation bilateral legs with no edema  There is no JVD.  Pulmonary/Chest: Effort normal and breath sounds normal.  Abdominal: Soft. Bowel sounds are normal.  Musculoskeletal: Normal range of motion.  Neurological: He is alert.  Skin: Skin is warm and dry.  Psychiatric: He has a normal mood and affect.   Radiology: No results found.  Laboratory examination:   Recent Labs    02/05/18 0834 04/20/18 1414 05/28/18 0807  NA 138 138 138  K 4.7 4.1 4.5  CL 103 103 103  CO2 32 24 30  GLUCOSE 177* 130* 135*  BUN 21 11 18   CREATININE 1.15 0.98 1.16  CALCIUM 9.7 9.6 9.2  GFRNONAA 65 >60 64  GFRAA 75 >60 75   CMP Latest Ref Rng & Units 05/28/2018 04/20/2018 02/05/2018  Glucose 65 - 99 mg/dL 135(H) 130(H) 177(H)  BUN 7 - 25 mg/dL 18 11 21   Creatinine 0.70 - 1.25 mg/dL 1.16 0.98 1.15  Sodium 135 - 146 mmol/L 138 138 138  Potassium 3.5 - 5.3 mmol/L 4.5 4.1 4.7  Chloride 98 - 110 mmol/L 103 103 103  CO2 20 - 32 mmol/L 30 24 32  Calcium 8.6 - 10.3 mg/dL 9.2 9.6 9.7  Total Protein 6.5 - 8.1 g/dL - 7.2 6.9  Total Bilirubin 0.3 - 1.2 mg/dL - 1.1 0.7  Alkaline Phos 38 - 126 U/L - 48 -  AST 15 - 41 U/L - 24 21  ALT 0 - 44 U/L - 35 33   CBC Latest Ref Rng & Units 04/20/2018 10/07/2017 05/23/2017  WBC 4.0 - 10.5 K/uL 8.4 4.5 -  Hemoglobin 13.0 - 17.0 g/dL 16.6 14.5 13.3  Hematocrit 39.0 - 52.0 % 49.2 43.0 39.0  Platelets 150 - 400 K/uL 134(L) 114(L) -   Lipid Panel     Component Value Date/Time   CHOL 97 02/05/2018 0834   CHOL 86 (L) 07/03/2015 0816   TRIG 109 02/05/2018 0834   HDL 47 02/05/2018 0834  HDL 37 (L) 07/03/2015 0816   CHOLHDL 2.1 02/05/2018 0834   VLDL 21 08/27/2016 0849   LDLCALC 31  02/05/2018 0834   HEMOGLOBIN A1C Lab Results  Component Value Date   HGBA1C 8.3 (H) 09/15/2018   MPG 192 09/15/2018   TSH No results for input(s): TSH in the last 8760 hours. Medications and allergies   Allergies  Allergen Reactions  . Lyrica [Pregabalin] Other (See Comments)    Made patient very lethargic the next morning, very hard to patient to function, move, etc.   . Statins Other (See Comments)    Causes Restless Legs, rhabdomyolysis  . Metformin And Related     GI upset  . Trulicity [Dulaglutide] Other (See Comments)    GI side effects      Prior to Admission medications   Medication Sig Start Date End Date Taking? Authorizing Provider  acetaminophen (TYLENOL) 500 MG tablet Take 1,000 mg by mouth every 6 (six) hours as needed for headache.     [provider]  albuterol (PROVENTIL HFA;VENTOLIN HFA) 108 (90 Base) MCG/ACT inhaler Inhale 2 puffs into the lungs every 6 (six) hours as needed for wheezing or shortness of breath. 06/08/18   Reed, Tiffany L, DO  Alirocumab (PRALUENT) 75 MG/ML SOPN Inject 75 mg into the skin every 14 (fourteen) days.     [provider]  aspirin 81 MG EC tablet Take 1 tablet (81 mg total) by mouth daily. 12/16/12   [name], MD  BD PEN NEEDLE NANO U/F 32G X 4 MM MISC USE TWICE DAILY WITH LANTUS PEN 06/01/18   Reed, Tiffany L, DO  carvedilol (COREG) 6.25 MG tablet TAKE 1/2 TABLET BY MOUTH TWICE DAILY WITH MEALS 08/20/18   Reed, Tiffany L, DO  fluticasone (FLONASE) 50 MCG/ACT nasal spray Place 1 spray into both nostrils daily as needed for allergies or rhinitis.    [provider]  gabapentin (NEURONTIN) 100 MG capsule Take 200 mg by mouth at bedtime.  05/14/17   [provider]  Insulin Glargine (BASAGLAR KWIKPEN) 100 UNIT/ML SOPN INJECT 40 UNITS SUBCUTANEOUSLY INTO THE SKIN AT BEDTIME 04/30/18   Reed, Tiffany L, DO  JARDIANCE 10 MG TABS tablet TAKE 1 TABLET BY MOUTH DAILY 10/19/18   Renato Gails, Tiffany L, DO  LIFESCAN  FINEPOINT LANCETS MISC Use to test for blood sugar three times daily dx: E11.51 06/01/18   [name], NP  nitroGLYCERIN (NITROSTAT) 0.4 MG SL tablet PLACE 1 TABLET (0.4 MG TOTAL) UNDER THE TONGUE EVERY 5 (FIVE) MINUTES AS NEEDED FOR CHEST PAIN. 06/23/17   Reed, Tiffany L, DO  ONETOUCH ULTRA test strip USE TO TEST FOR BLOOD SUGAR THREE TIMES DAILY DX: E11.51 12/04/18   Reed, Tiffany L, DO  pantoprazole (PROTONIX) 40 MG tablet TAKE ONE TABLET BY MOUTH TWICE DAILY FOR STOMACH 08/20/18   Reed, Tiffany L, DO  pramipexole (MIRAPEX) 1 MG tablet Take 2 tablets (2 mg total) by mouth at bedtime. DX G25.81 11/18/18   Reed, Tiffany L, DO  valsartan (DIOVAN) 80 MG tablet Take 80 mg by mouth daily.    [provider]     Current Outpatient Medications  Medication Instructions  . acetaminophen (TYLENOL) 1,000 mg, Oral, Every 6 hours PRN  . albuterol (PROVENTIL HFA;VENTOLIN HFA) 108 (90 Base) MCG/ACT inhaler 2 puffs, Inhalation, Every 6 hours PRN  . Alirocumab (PRALUENT) 75 mg, Subcutaneous, Every 14 days  . aspirin 81 mg, Oral, Daily  . BD PEN NEEDLE NANO U/F 32G X 4  MM MISC USE TWICE DAILY WITH LANTUS PEN  . carvedilol (COREG) 6.25 MG tablet TAKE 1/2 TABLET BY MOUTH TWICE DAILY WITH MEALS  . fluticasone (FLONASE) 50 MCG/ACT nasal spray 1 spray, Each Nare, Daily PRN  . gabapentin (NEURONTIN) 200 mg, Oral, Daily at bedtime  . Insulin Glargine (BASAGLAR KWIKPEN) 100 UNIT/ML SOPN INJECT 40 UNITS SUBCUTANEOUSLY INTO THE SKIN AT BEDTIME  . JARDIANCE 10 MG TABS tablet TAKE 1 TABLET BY MOUTH DAILY  . LIFESCAN FINEPOINT LANCETS MISC Use to test for blood sugar three times daily dx: E11.51   . nitroGLYCERIN (NITROSTAT) 0.4 mg, Sublingual, Every 5 min PRN  . ONETOUCH ULTRA test strip USE TO TEST FOR BLOOD SUGAR THREE TIMES DAILY DX: E11.51  . pantoprazole (PROTONIX) 40 MG tablet TAKE ONE TABLET BY MOUTH TWICE DAILY FOR STOMACH  . pramipexole (MIRAPEX) 2 mg, Oral, Daily at bedtime, DX G25.81  . valsartan  (DIOVAN) 80 mg, Oral, Daily    Cardiac Studies:   Coronary angiogram 09/25/2015: Ostial Cx 50-60%, FFR 0.86. LVEF 60%. Patent stent placed 05/03/2014: Proximal LAD 3.0x12 mm Promus DES. 04/08/2014: Cx/OM1 with 3.5 x 16 mm Promus DES. H/O Mid LAD 2.75 x 38 mm Promus Premier DES, balloon angioplasty of D1 and distal LAD(03/29/2013) and Distal RCA stent (12/15/2012) with 2.75 x 24 mm promos Premier DES.  Exercise myoview stress 08/28/2015: 1. The resting electrocardiogram demonstrated normal sinus rhythm, normal resting conduction, no resting arrhythmias and normal rest repolarization.  Stress EKG is equivocal for ischemia, there was 2 mm upsloping ST segment depression noted with peak exercise, reversed back to baseline at 2 minute recovery. The stress test was terminated because of fatigue. Patient exercised on Bruce protocol for 7:22 minutes and achieved 8.57 METS. Stress test terminated due to target heart rate( 94% MPHR).  2. Myocardial perfusion imaging is normal. Overall left ventricular systolic function was normal without regional wall motion abnormalities. The left ventricular ejection fraction was 59%.  This is a low risk study. clinical correlation recommended.  Compared to the study done on 11/20/2012, stress induced chest pain not present, inferior wall ischemia not present.  Echocardiogram 08/31/2015: Left ventricle cavity is normal in size. Normal global wall motion. Doppler evidence of grade I (impaired) diastolic dysfunction. Calculated EF 58%. Left atrial cavity is mildly dilated. Mild (Grade I) aortic regurgitation. Mild (Grade I) mitral regurgitation. Mild tricuspid regurgitation. No evidence of pulmonary hypertension. No significant change since 10/07/2013.  Lower extremity venous duplex for insufficiency 05/29/2017: Bilateral lower extremities negative for DVT or reflux.  Assessment     ICD-10-CM   1. Coronary artery disease of native artery of native heart with stable angina  pectoris (HCC)  I25.118   2. Essential hypertension  I10   3. Pure hypercholesterolemia  E78.00   4. Uncontrolled diabetes mellitus with circulatory complication, with long-term current use of insulin (HCC)  E11.59    Z79.4    E11.65   5. Thrombocytopenia (HCC)  D69.6   6. Statin intolerance  Z78.9    EKG 12/24/2018: Normal sinus rhythm at the rate of 59 beats minute, left axis deviation, poor R-wave progression, cannot exclude anteroseptal infarct old.  Nonspecific inferior T-wave abnormality. Compared to 04/07/2017, inferior nonspecific T abnormality new.  Recommendations:   Patient with coronary artery disease, last coronary angiography on 09/25/2015 had revealed widely patent stents that was placed in proximal LAD, mid LAD, balloon angioplasty to D1 and distal LAD and stent to circumflex OM1,  hypertension, hyperlipidemia, Statin intolerance, uncontrolled diabetes mellitus  and also severe restless leg syndrome. This is his annual visit, has had one episode of chest pain which appears to be atypical at most.  Advised him to continue to use nitroglycerin as recommended and if he continues to have recurrence of chest pain, to call us.  Lipids are well controlled on Praluent.  Blood pressure is well controlled.  Diabetes continues to be uncontrolled and we discussed regarding making lifestyle changes again. Chronic mild thrombocytopenia, this has remained stable.  Otherwise I did not make any changes to his medications, I reviewed his labs, lipids are also under excellent control.  I will see him back in one year.  [name], MD, [facility] 12/24/2018, 7:27 AM [facility] [place] Pager: [phone] Office: [phone] If no answer Cell [phone]

## 2019-01-22 ENCOUNTER — Other Ambulatory Visit: Payer: Self-pay

## 2019-01-22 ENCOUNTER — Ambulatory Visit (INDEPENDENT_AMBULATORY_CARE_PROVIDER_SITE_OTHER): Payer: Medicare Other | Admitting: Family

## 2019-01-22 ENCOUNTER — Encounter: Payer: Self-pay | Admitting: Family

## 2019-01-22 DIAGNOSIS — E1151 Type 2 diabetes mellitus with diabetic peripheral angiopathy without gangrene: Secondary | ICD-10-CM | POA: Diagnosis not present

## 2019-01-22 DIAGNOSIS — E1165 Type 2 diabetes mellitus with hyperglycemia: Secondary | ICD-10-CM | POA: Diagnosis not present

## 2019-01-22 DIAGNOSIS — Z20822 Contact with and (suspected) exposure to covid-19: Secondary | ICD-10-CM

## 2019-01-22 DIAGNOSIS — Z20828 Contact with and (suspected) exposure to other viral communicable diseases: Secondary | ICD-10-CM | POA: Diagnosis not present

## 2019-01-22 DIAGNOSIS — IMO0002 Reserved for concepts with insufficient information to code with codable children: Secondary | ICD-10-CM

## 2019-01-22 MED ORDER — VITAMIN C 500 MG PO TABS: 500.0000 mg | ORAL_TABLET | Freq: Two times a day (BID) | ORAL | 0 refills | Status: DC

## 2019-01-22 MED ORDER — VITAMIN D3 25 MCG (1000 UNIT) PO TABS: 1000.0000 [IU] | ORAL_TABLET | Freq: Every day | ORAL | 0 refills | Status: DC

## 2019-01-22 MED ORDER — METFORMIN HCL 500 MG PO TABS: 500.0000 mg | ORAL_TABLET | Freq: Every day | ORAL | 3 refills | Status: DC

## 2019-01-22 MED ORDER — ZINC GLUCONATE 50 MG PO TABS: 50.0000 mg | ORAL_TABLET | Freq: Every day | ORAL | 0 refills | Status: AC

## 2019-01-22 NOTE — Patient Instructions (Addendum)
-   Take Vitamin D 1000 mg tablet daily - Zinc 50 mg tablet one by mouth daily x 14 days. - Vitamin C 500 mg tablet one by mouth twice daily    This information is directly available on the CDC website: RunningShows.co.za.html    Source:CDC Reference to specific commercial products, manufacturers, companies, or trademarks does not constitute its endorsement or recommendation by the [place], [place], or Centers for [employer] and Prevention.

## 2019-01-22 NOTE — Progress Notes (Signed)
This service is provided via telemedicine  No vital signs collected/recorded due to the encounter was a telemedicine visit.   Location of patient (ex: home, work): Home   Patient consents to a telephone visit:  Yes  Location of the provider (ex: office, home): office   Name of any referring provider: Bufford Spikes, DVal Eagle   Names of all persons participating in the telemedicine service and their role in the encounter:  Richarda Blade, NP, Asher Muir CMA, and patient.   Time spent on call:  Asher Muir CMA, Spent 9 minutes on patent    Provider: Joffre Lucks FNP-C  Kermit Balo, DO  Patient Care Team: Kermit Balo, DO as PCP - General (Geriatric Medicine) [name], MD as Consulting Physician (Cardiology) Van Clines, MD as Consulting Physician (Neurology) [name], [name], OD (Optometry) [name], MD as Consulting Physician (Otolaryngology)  Extended Emergency Contact Information Primary Emergency Contact: Griggs,Pam Address: [address]          [place], [place] [address] [place] Phone: [phone] Relation: Spouse Secondary Emergency Contact: Munn,Brian  [place] Mobile Phone: [phone] Relation: Son  Code Status: Full code  Goals of care: Advanced Directive information Advanced Directives 03/02/2018  Does Patient Have a Medical Advance Directive? No  Does patient want to make changes to medical advance directive? -  Would patient like information on creating a medical advance directive? No - Patient declined  Pre-existing out of facility DNR order (yellow form or pink MOST form) -     Chief Complaint  Patient presents with  . Acute Visit    Positive exposure friend tested positive, patient states he has not seen friend since last Tuesday. Friend tested wednesday and tested positive.  . Medication Management    Patient states he can not afford jardiance and is not taking and needs something else      HPI:  Pt is a 68 y.o. male seen today for an acute visit for evaluation of exposure to COVID-19.He states he was in close contact with a friend without any face mask on 01/12/2019.His friend and wife tested positive on 11/81/2020.He states does not have any symptoms but would like to be tested.he denies any cough,fever,chills,loss of taste/smell or shortness of breath.  He would also like to restart on Metformin.states had stomach upset while on Metformin in the past but thinks he can give another try.He states currently on Jardiance but states too expensive.     Past Medical History:  Diagnosis Date  . Arthritis    "left thumb" (05/23/2017)  . Barrett's esophagus    "we've been told that it's all gone; still take RX for GERD" (05/23/2017)  . CAD (coronary artery disease), native coronary artery    Coronary angiogram 05/03/2014:  Proximal LAD 3.0x12 mm Promus premier stent.  04/08/2014: Mid Cx 3.5 x 16 mm Promus DES.  03/29/2013: Mid LAD 2.75 x 38 mm Promus Premier drug-eluting stent, balloon angioplasty of D1 and distal LAD and stenting of distal RCA with 2.75 x 24 mm promos Premier drug-eluting stent 12/15/2012 widely patent.  . Cervicalgia   . Chronic bronchitis (HCC)    "q yr" (05/23/2017)  . Complication of anesthesia    " I do not wake up very well "  . Family history of adverse reaction to anesthesia    "son w/PONV"  . GERD (gastroesophageal reflux disease)   . Heart murmur    "grew out of it"   .  Hyperlipidemia   . Hypoglycemia, unspecified   . IDDM (insulin dependent diabetes mellitus)   . Impotence of organic origin   . Iron deficiency anemia   . Other malaise and fatigue   . PONV (postoperative nausea and vomiting)   . Restless leg   . Tension headache    "sometimes" (05/23/2017)  . Unspecified gastritis and gastroduodenitis with hemorrhage   . Unstable angina pectoris ([facility]) 04/08/2014   Coronary angiogram 05/03/2014:  Proximal LAD 3.0x12 mm Promus premier stent.  04/08/2014: Mid  Cx 3.5 x 16 mm Promus DES.  03/29/2013: Mid LAD 2.75 x 38 mm Promus Premier drug-eluting stent, balloon angioplasty of D1 and distal LAD and stenting of distal RCA with 2.75 x 24 mm promos Premier drug-eluting stent 12/15/2012 widely patent.   Past Surgical History:  Procedure Laterality Date  . CARDIAC CATHETERIZATION N/A 09/25/2015   Procedure: Left Heart Cath and Coronary Angiography;  Surgeon: Adrian Prows, MD;  Location: [facility];  Service: Cardiovascular;  Laterality: N/A;  . CARDIAC CATHETERIZATION N/A 09/25/2015   Procedure: Intravascular Pressure Wire/FFR Study;  Surgeon: Adrian Prows, MD;  Location: [facility];  Service: Cardiovascular;  Laterality: N/A;  . CORONARY ANGIOPLASTY WITH STENT PLACEMENT  12/15/2012; 04/28/2014; 05/03/2014   "2; 1; 1"   . FRACTIONAL FLOW RESERVE WIRE Right 03/26/2013   Procedure: FRACTIONAL FLOW RESERVE WIRE;  Surgeon: Laverda Page, MD;  Location: [facility] C[facility] LAB;  Service: Cardiovascular;  Laterality: Right;  . FRACTIONAL FLOW RESERVE WIRE N/A 05/03/2014   Procedure: FRACTIONAL FLOW RESERVE WIRE;  Surgeon: Laverda Page, MD;  Location: [facility] CATH LAB;  Service: Cardiovascular;  Laterality: N/A;  . HERNIA REPAIR  [id number]   "umbilical"   . INCISION AND DRAINAGE ABSCESS Left 05/2007   "groin"   . KNEE SURGERY Right 1992;  2000   "calcium deposits removed" (12/15/2012)  . LEFT HEART CATH AND CORONARY ANGIOGRAPHY N/A 05/23/2017   Procedure: LEFT HEART CATH AND CORONARY ANGIOGRAPHY;  Surgeon: Adrian Prows, MD;  Location: [facility];  Service: Cardiovascular;  Laterality: N/A;  . LEFT HEART CATHETERIZATION WITH CORONARY ANGIOGRAM N/A 12/15/2012   Procedure: LEFT HEART CATHETERIZATION WITH CORONARY ANGIOGRAM;  Surgeon: Laverda Page, MD;  Location: [facility] CATH LAB;  Service: Cardiovascular;  Laterality: N/A;  . LEFT HEART CATHETERIZATION WITH CORONARY ANGIOGRAM N/A 03/26/2013   Procedure: LEFT HEART CATHETERIZATION WITH CORONARY ANGIOGRAM;  Surgeon:  Laverda Page, MD;  Location: [facility] CATH LAB;  Service: Cardiovascular;  Laterality: N/A;  . LEFT HEART CATHETERIZATION WITH CORONARY ANGIOGRAM N/A 04/08/2014   Procedure: LEFT HEART CATHETERIZATION WITH CORONARY ANGIOGRAM;  Surgeon: Blane Ohara, MD;  Location: [facility] CATH LAB;  Service: Cardiovascular;  Laterality: N/A;  . PERCUTANEOUS CORONARY STENT INTERVENTION (PCI-S) N/A 05/03/2014   Procedure: PERCUTANEOUS CORONARY STENT INTERVENTION (PCI-S);  Surgeon: Laverda Page, MD;  Location: [facility] CATH LAB;  Service: Cardiovascular;  Laterality: N/A;  . UMBILICAL HERNIA REPAIR      Allergies  Allergen Reactions  . Lyrica [Pregabalin] Other (See Comments)    Made patient very lethargic the next morning, very hard to patient to function, move, etc.   . Statins Other (See Comments)    Causes Restless Legs, rhabdomyolysis  . Metformin And Related     GI upset  . Trulicity [Dulaglutide] Other (See Comments)    GI side effects     Outpatient Encounter Medications as of 01/22/2019  Medication Sig  . acetaminophen (TYLENOL) 500 MG tablet Take 1,000 mg  by mouth every 6 (six) hours as needed for headache.   . albuterol (PROVENTIL HFA;VENTOLIN HFA) 108 (90 Base) MCG/ACT inhaler Inhale 2 puffs into the lungs every 6 (six) hours as needed for wheezing or shortness of breath.  . Alirocumab (PRALUENT) 75 MG/ML SOPN Inject 75 mg into the skin every 14 (fourteen) days.   Marland Kitchen aspirin 81 MG EC tablet Take 1 tablet (81 mg total) by mouth daily.  . BD PEN NEEDLE NANO U/F 32G X 4 MM MISC USE TWICE DAILY WITH LANTUS PEN  . carvedilol (COREG) 6.25 MG tablet TAKE 1/2 TABLET BY MOUTH TWICE DAILY WITH MEALS  . fluticasone (FLONASE) 50 MCG/ACT nasal spray Place 1 spray into both nostrils daily as needed for allergies or rhinitis.  Marland Kitchen gabapentin (NEURONTIN) 100 MG capsule Take 200 mg by mouth at bedtime.   . Insulin Glargine (BASAGLAR KWIKPEN) 100 UNIT/ML SOPN INJECT 40 UNITS SUBCUTANEOUSLY INTO THE SKIN AT BEDTIME  .  LIFESCAN FINEPOINT LANCETS MISC Use to test for blood sugar three times daily dx: E11.51  . nitroGLYCERIN (NITROSTAT) 0.4 MG SL tablet PLACE 1 TABLET (0.4 MG TOTAL) UNDER THE TONGUE EVERY 5 (FIVE) MINUTES AS NEEDED FOR CHEST PAIN.  Marland Kitchen ONETOUCH ULTRA test strip USE TO TEST FOR BLOOD SUGAR THREE TIMES DAILY DX: E11.51  . pantoprazole (PROTONIX) 40 MG tablet Take 40 mg by mouth daily.  . pramipexole (MIRAPEX) 1 MG tablet Take 2 tablets (2 mg total) by mouth at bedtime. DX G25.81  . valsartan (DIOVAN) 80 MG tablet Take 80 mg by mouth daily.  . [DISCONTINUED] JARDIANCE 10 MG TABS tablet TAKE 1 TABLET BY MOUTH DAILY  . [DISCONTINUED] pantoprazole (PROTONIX) 40 MG tablet TAKE ONE TABLET BY MOUTH TWICE DAILY FOR STOMACH (Patient taking differently: Take 40 mg by mouth daily. TAKE ONE TABLET BY MOUTH TWICE DAILY FOR STOMACH)   No facility-administered encounter medications on file as of 01/22/2019.     Review of Systems  Constitutional: Negative for appetite change, chills, fatigue and fever.  HENT: Negative for congestion, rhinorrhea, sinus pressure, sinus pain, sneezing and sore throat.   Respiratory: Negative for cough, chest tightness, shortness of breath and wheezing.   Cardiovascular: Negative for chest pain, palpitations and leg swelling.  Gastrointestinal: Negative for abdominal distention, abdominal pain, constipation, diarrhea, nausea and vomiting.    Immunization History  Administered Date(s) Administered  . Pneumococcal Conjugate-13 12/01/2013  . Td 03/04/1998, 03/01/2017  . Tdap 03/06/2011, 02/28/2017   Pertinent  Health Maintenance Due  Topic Date Due  . PNA vac Low Risk Adult (2 of 2 - PPSV23) 12/02/2014  . FOOT EXAM  05/23/2018  . INFLUENZA VACCINE  10/03/2018  . HEMOGLOBIN A1C  03/18/2019  . OPHTHALMOLOGY EXAM  03/20/2019  . COLONOSCOPY  10/18/2025   Fall Risk  01/22/2019 09/24/2018 08/31/2018 06/01/2018 02/12/2018  Falls in the past year? 0 0 0 0 0  Number falls in past yr:  0 0 0 0 -  Injury with Fall? 0 0 0 0 -   There were no vitals filed for this visit. There is no height or weight on file to calculate BMI. Physical Exam Unable to complete on telephone visit.   Labs reviewed: Recent Labs    02/05/18 0834 04/20/18 1414 05/28/18 0807  NA 138 138 138  K 4.7 4.1 4.5  CL 103 103 103  CO2 32 24 30  GLUCOSE 177* 130* 135*  BUN 21 11 18   CREATININE 1.15 0.98 1.16  CALCIUM 9.7 9.6 9.2  Recent Labs    02/05/18 0834 04/20/18 1414  AST 21 24  ALT 33 35  ALKPHOS  --  48  BILITOT 0.7 1.1  PROT 6.9 7.2  ALBUMIN  --  4.3   Recent Labs    04/20/18 1414  WBC 8.4  HGB 16.6  HCT 49.2  MCV 87.5  PLT 134*   Lab Results  Component Value Date   TSH 2.65 10/09/2015   Lab Results  Component Value Date   HGBA1C 8.3 (H) 09/15/2018   Lab Results  Component Value Date   CHOL 97 02/05/2018   HDL 47 02/05/2018   LDLCALC 31 02/05/2018   TRIG 109 02/05/2018   CHOLHDL 2.1 02/05/2018    Significant Diagnostic Results in last 30 days:  No results found.  Assessment/Plan 1. Exposure to COVID-19 virus Afebrile.Exposure to friend without wearing mask or social distancing.Asymptomatic.Encouraged to follow CDC guidelines wearing a mask,social distancing and hand hygiene.CDC guidelines attached on AVS. - Take Vitamin D 1000 mg tablet daily - Zinc 50 mg tablet one by mouth daily x 14 days. - Vitamin C 500 mg tablet one by mouth twice daily  - Noval coronavirus test,Future  Discussed with patient to call 9-1-1 or go to ED if symptoms worsen.   2. Uncontrolled diabetes mellitus with peripheral circulatory disorder (HCC) Lab Results  Component Value Date   HGBA1C 8.3 (H) 09/15/2018  Request to restart on Metformin 500 mg tablet states Jardiance is too expensive. Previously had stomach upset with Metformin.will try and take it with food.  - Metformin 500 mg tablet one by mouth daily.   Family/ staff Communication: Reviewed plan of care with  patient.  Labs/tests ordered: - Noval coronavirus test,Future   Spent 11 minutes of non-face to face with patient   Caesar Bookmaninah C Thermon Zulauf, NP

## 2019-01-25 LAB — NOVEL CORONAVIRUS, NAA: SARS-CoV-2, NAA: NOT DETECTED

## 2019-01-27 ENCOUNTER — Other Ambulatory Visit: Payer: Medicare Other

## 2019-02-01 ENCOUNTER — Ambulatory Visit: Payer: Medicare Other | Admitting: Internal Medicine

## 2019-02-03 ENCOUNTER — Other Ambulatory Visit: Payer: Medicare Other

## 2019-02-05 ENCOUNTER — Ambulatory Visit: Payer: Medicare Other | Admitting: Internal Medicine

## 2019-02-13 ENCOUNTER — Other Ambulatory Visit: Payer: Self-pay | Admitting: Family

## 2019-02-13 DIAGNOSIS — Z20822 Contact with and (suspected) exposure to covid-19: Secondary | ICD-10-CM

## 2019-02-13 DIAGNOSIS — Z20828 Contact with and (suspected) exposure to other viral communicable diseases: Secondary | ICD-10-CM

## 2019-03-02 ENCOUNTER — Ambulatory Visit (INDEPENDENT_AMBULATORY_CARE_PROVIDER_SITE_OTHER): Payer: Medicare Other | Admitting: Nurse Practitioner

## 2019-03-02 ENCOUNTER — Other Ambulatory Visit: Payer: Self-pay

## 2019-03-02 ENCOUNTER — Encounter: Payer: Self-pay | Admitting: Nurse Practitioner

## 2019-03-02 VITALS — Ht 68.0 in | Wt 183.0 lb

## 2019-03-02 DIAGNOSIS — Z0000 Encounter for general adult medical examination without abnormal findings: Secondary | ICD-10-CM

## 2019-03-02 DIAGNOSIS — Z1159 Encounter for screening for other viral diseases: Secondary | ICD-10-CM | POA: Diagnosis not present

## 2019-03-02 MED ORDER — METFORMIN HCL 500 MG PO TABS: 500.0000 mg | ORAL_TABLET | Freq: Every day | ORAL | 0 refills | Status: DC

## 2019-03-02 NOTE — Patient Instructions (Signed)
[name] , Thank you for taking time to come for your Medicare Wellness Visit. I appreciate your ongoing commitment to your health goals. Please review the following plan we discussed and let me know if I can assist you in the future.   Screening recommendations/referrals: Colonoscopy up to date Recommended yearly ophthalmology/optometry visit for glaucoma screening and checkup Recommended yearly dental visit for hygiene and checkup  Vaccinations: Influenza vaccine declined Pneumococcal vaccine overdue for PPSV23- recommended to get at next office visit Tdap vaccine up to date Shingles vaccine recommended to get at your local pharmacy    Advanced directives: recommended to complete and bring copy to office to place on file  Conditions/risks identified: cardiovascular risk factors and complications due to uncontrolled diabetes.   Next appointment: 1 year.   Preventive Care 25 Years and Older, Male Preventive care refers to lifestyle choices and visits with your health care provider that can promote health and wellness. What does preventive care include?  A yearly physical exam. This is also called an annual well check.  Dental exams once or twice a year.  Routine eye exams. Ask your health care provider how often you should have your eyes checked.  Personal lifestyle choices, including:  Daily care of your teeth and gums.  Regular physical activity.  Eating a healthy diet.  Avoiding tobacco and drug use.  Limiting alcohol use.  Practicing safe sex.  Taking low doses of aspirin every day.  Taking vitamin and mineral supplements as recommended by your health care provider. What happens during an annual well check? The services and screenings done by your health care provider during your annual well check will depend on your age, overall health, lifestyle risk factors, and family history of disease. Counseling  Your health care provider may ask you questions about  your:  Alcohol use.  Tobacco use.  Drug use.  Emotional well-being.  Home and relationship well-being.  Sexual activity.  Eating habits.  History of falls.  Memory and ability to understand (cognition).  Work and work Statistician. Screening  You may have the following tests or measurements:  Height, weight, and BMI.  Blood pressure.  Lipid and cholesterol levels. These may be checked every 5 years, or more frequently if you are over 40 years old.  Skin check.  Lung cancer screening. You may have this screening every year starting at age 34 if you have a 30-pack-year history of smoking and currently smoke or have quit within the past 15 years.  Fecal occult blood test (FOBT) of the stool. You may have this test every year starting at age 72.  Flexible sigmoidoscopy or colonoscopy. You may have a sigmoidoscopy every 5 years or a colonoscopy every 10 years starting at age 14.  Prostate cancer screening. Recommendations will vary depending on your family history and other risks.  Hepatitis C blood test.  Hepatitis B blood test.  Sexually transmitted disease (STD) testing.  Diabetes screening. This is done by checking your blood sugar (glucose) after you have not eaten for a while (fasting). You may have this done every 1-3 years.  Abdominal aortic aneurysm (AAA) screening. You may need this if you are a current or former smoker.  Osteoporosis. You may be screened starting at age 96 if you are at high risk. Talk with your health care provider about your test results, treatment options, and if necessary, the need for more tests. Vaccines  Your health care provider may recommend certain vaccines, such as:  Influenza vaccine.  This is recommended every year.  Tetanus, diphtheria, and acellular pertussis (Tdap, Td) vaccine. You may need a Td booster every 10 years.  Zoster vaccine. You may need this after age 71.  Pneumococcal 13-valent conjugate (PCV13) vaccine.  One dose is recommended after age 3.  Pneumococcal polysaccharide (PPSV23) vaccine. One dose is recommended after age 31. Talk to your health care provider about which screenings and vaccines you need and how often you need them. This information is not intended to replace advice given to you by your health care provider. Make sure you discuss any questions you have with your health care provider. Document Released: 03/17/2015 Document Revised: 11/08/2015 Document Reviewed: 12/20/2014 Elsevier Interactive Patient Education  2017 [place] Prevention in the Home Falls can cause injuries. They can happen to people of all ages. There are many things you can do to make your home safe and to help prevent falls. What can I do on the outside of my home?  Regularly fix the edges of walkways and driveways and fix any cracks.  Remove anything that might make you trip as you walk through a door, such as a raised step or threshold.  Trim any bushes or trees on the path to your home.  Use bright outdoor lighting.  Clear any walking paths of anything that might make someone trip, such as rocks or tools.  Regularly check to see if handrails are loose or broken. Make sure that both sides of any steps have handrails.  Any raised decks and porches should have guardrails on the edges.  Have any leaves, snow, or ice cleared regularly.  Use sand or salt on walking paths during winter.  Clean up any spills in your garage right away. This includes oil or grease spills. What can I do in the bathroom?  Use night lights.  Install grab bars by the toilet and in the tub and shower. Do not use towel bars as grab bars.  Use non-skid mats or decals in the tub or shower.  If you need to sit down in the shower, use a plastic, non-slip stool.  Keep the floor dry. Clean up any water that spills on the floor as soon as it happens.  Remove soap buildup in the tub or shower regularly.  Attach bath  mats securely with double-sided non-slip rug tape.  Do not have throw rugs and other things on the floor that can make you trip. What can I do in the bedroom?  Use night lights.  Make sure that you have a light by your bed that is easy to reach.  Do not use any sheets or blankets that are too big for your bed. They should not hang down onto the floor.  Have a firm chair that has side arms. You can use this for support while you get dressed.  Do not have throw rugs and other things on the floor that can make you trip. What can I do in the kitchen?  Clean up any spills right away.  Avoid walking on wet floors.  Keep items that you use a lot in easy-to-reach places.  If you need to reach something above you, use a strong step stool that has a grab bar.  Keep electrical cords out of the way.  Do not use floor polish or wax that makes floors slippery. If you must use wax, use non-skid floor wax.  Do not have throw rugs and other things on the floor that can make  you trip. What can I do with my stairs?  Do not leave any items on the stairs.  Make sure that there are handrails on both sides of the stairs and use them. Fix handrails that are broken or loose. Make sure that handrails are as long as the stairways.  Check any carpeting to make sure that it is firmly attached to the stairs. Fix any carpet that is loose or worn.  Avoid having throw rugs at the top or bottom of the stairs. If you do have throw rugs, attach them to the floor with carpet tape.  Make sure that you have a light switch at the top of the stairs and the bottom of the stairs. If you do not have them, ask someone to add them for you. What else can I do to help prevent falls?  Wear shoes that:  Do not have high heels.  Have rubber bottoms.  Are comfortable and fit you well.  Are closed at the toe. Do not wear sandals.  If you use a stepladder:  Make sure that it is fully opened. Do not climb a closed  stepladder.  Make sure that both sides of the stepladder are locked into place.  Ask someone to hold it for you, if possible.  Clearly mark and make sure that you can see:  Any grab bars or handrails.  First and last steps.  Where the edge of each step is.  Use tools that help you move around (mobility aids) if they are needed. These include:  Canes.  Walkers.  Scooters.  Crutches.  Turn on the lights when you go into a dark area. Replace any light bulbs as soon as they burn out.  Set up your furniture so you have a clear path. Avoid moving your furniture around.  If any of your floors are uneven, fix them.  If there are any pets around you, be aware of where they are.  Review your medicines with your doctor. Some medicines can make you feel dizzy. This can increase your chance of falling. Ask your doctor what other things that you can do to help prevent falls. This information is not intended to replace advice given to you by your health care provider. Make sure you discuss any questions you have with your health care provider. Document Released: 12/15/2008 Document Revised: 07/27/2015 Document Reviewed: 03/25/2014 Elsevier Interactive Patient Education  2017 [employer].

## 2019-03-02 NOTE — Progress Notes (Addendum)
Subjective:   Kenneth SicRalph Posadas Sr. is a 68 y.o. male who presents for Medicare Annual/Subsequent preventive examination.  Review of Systems:   Cardiac Risk Factors include: diabetes mellitus;hypertension;dyslipidemia;male gender     Objective:    Vitals: Ht 5\' 8"  (1.727 m)   Wt 183 lb (83 kg)   BMI 27.83 kg/m   Body mass index is 27.83 kg/m.  Advanced Directives 03/02/2019 03/02/2018 02/12/2018 05/23/2017 05/22/2017 04/25/2017 08/29/2016  Does Patient Have a Medical Advance Directive? No No No No No No No  Does patient want to make changes to medical advance directive? - - - - - - -  Would patient like information on creating a medical advance directive? No - Patient declined No - Patient declined - No - Patient declined Yes (MAU/Ambulatory/Procedural Areas - Information given) Yes (MAU/Ambulatory/Procedural Areas - Information given) No - Patient declined  Pre-existing out of facility DNR order (yellow form or pink MOST form) - - - - - - -    Tobacco Social History   Tobacco Use  Smoking Status Never Smoker  Smokeless Tobacco Never Used     Counseling given: Not Answered   Clinical Intake:  Pre-visit preparation completed: Yes  Pain : No/denies pain     BMI - recorded: 27.83 Nutritional Status: BMI 25 -29 Overweight Nutritional Risks: None Diabetes: Yes  How often do you need to have someone help you when you read instructions, pamphlets, or other written materials from your doctor or pharmacy?: 1 - Never What is the last grade level you completed in school?: 12th grade  Interpreter Needed?: No     Past Medical History:  Diagnosis Date  . Arthritis    "left thumb" (05/23/2017)  . Barrett's esophagus    "we've been told that it's all gone; still take RX for GERD" (05/23/2017)  . CAD (coronary artery disease), native coronary artery    Coronary angiogram 05/03/2014:  Proximal LAD 3.0x12 mm Promus premier stent.  04/08/2014: Mid Cx 3.5 x 16 mm Promus DES.  03/29/2013: Mid  LAD 2.75 x 38 mm Promus Premier drug-eluting stent, balloon angioplasty of D1 and distal LAD and stenting of distal RCA with 2.75 x 24 mm promos Premier drug-eluting stent 12/15/2012 widely patent.  . Cervicalgia   . Chronic bronchitis (HCC)    "q yr" (05/23/2017)  . Complication of anesthesia    " I do not wake up very well "  . Family history of adverse reaction to anesthesia    "son w/PONV"  . GERD (gastroesophageal reflux disease)   . Heart murmur    "grew out of it"   . Hyperlipidemia   . Hypoglycemia, unspecified   . IDDM (insulin dependent diabetes mellitus)   . Impotence of organic origin   . Iron deficiency anemia   . Other malaise and fatigue   . PONV (postoperative nausea and vomiting)   . Restless leg   . Tension headache    "sometimes" (05/23/2017)  . Unspecified gastritis and gastroduodenitis with hemorrhage   . Unstable angina pectoris (HCC) 04/08/2014   Coronary angiogram 05/03/2014:  Proximal LAD 3.0x12 mm Promus premier stent.  04/08/2014: Mid Cx 3.5 x 16 mm Promus DES.  03/29/2013: Mid LAD 2.75 x 38 mm Promus Premier drug-eluting stent, balloon angioplasty of D1 and distal LAD and stenting of distal RCA with 2.75 x 24 mm promos Premier drug-eluting stent 12/15/2012 widely patent.   Past Surgical History:  Procedure Laterality Date  . CARDIAC CATHETERIZATION N/A 09/25/2015   Procedure:  Left Heart Cath and Coronary Angiography;  Surgeon: [name], MD;  Location: [facility] INVASIVE CV LAB;  Service: Cardiovascular;  Laterality: N/A;  . CARDIAC CATHETERIZATION N/A 09/25/2015   Procedure: Intravascular Pressure Wire/FFR Study;  Surgeon: [name], MD;  Location: [facility] INVASIVE CV LAB;  Service: Cardiovascular;  Laterality: N/A;  . CORONARY ANGIOPLASTY WITH STENT PLACEMENT  12/15/2012; 04/28/2014; 05/03/2014   "2; 1; 1"   . FRACTIONAL FLOW RESERVE WIRE Right 03/26/2013   Procedure: FRACTIONAL FLOW RESERVE WIRE;  Surgeon: Pamella Pert, MD;  Location: [facility] CATH LAB;  Service: Cardiovascular;   Laterality: Right;  . FRACTIONAL FLOW RESERVE WIRE N/A 05/03/2014   Procedure: FRACTIONAL FLOW RESERVE WIRE;  Surgeon: Pamella Pert, MD;  Location: [facility] CATH LAB;  Service: Cardiovascular;  Laterality: N/A;  . HERNIA REPAIR  2010   "umbilical"   . INCISION AND DRAINAGE ABSCESS Left 05/2007   "groin"   . KNEE SURGERY Right 1992;  2000   "calcium deposits removed" (12/15/2012)  . LEFT HEART CATH AND CORONARY ANGIOGRAPHY N/A 05/23/2017   Procedure: LEFT HEART CATH AND CORONARY ANGIOGRAPHY;  Surgeon: [name], MD;  Location: [facility] INVASIVE CV LAB;  Service: Cardiovascular;  Laterality: N/A;  . LEFT HEART CATHETERIZATION WITH CORONARY ANGIOGRAM N/A 12/15/2012   Procedure: LEFT HEART CATHETERIZATION WITH CORONARY ANGIOGRAM;  Surgeon: Pamella Pert, MD;  Location: [facility] CATH LAB;  Service: Cardiovascular;  Laterality: N/A;  . LEFT HEART CATHETERIZATION WITH CORONARY ANGIOGRAM N/A 03/26/2013   Procedure: LEFT HEART CATHETERIZATION WITH CORONARY ANGIOGRAM;  Surgeon: Pamella Pert, MD;  Location: [facility] CATH LAB;  Service: Cardiovascular;  Laterality: N/A;  . LEFT HEART CATHETERIZATION WITH CORONARY ANGIOGRAM N/A 04/08/2014   Procedure: LEFT HEART CATHETERIZATION WITH CORONARY ANGIOGRAM;  Surgeon: Micheline Chapman, MD;  Location: [facility] CATH LAB;  Service: Cardiovascular;  Laterality: N/A;  . PERCUTANEOUS CORONARY STENT INTERVENTION (PCI-S) N/A 05/03/2014   Procedure: PERCUTANEOUS CORONARY STENT INTERVENTION (PCI-S);  Surgeon: Pamella Pert, MD;  Location: [facility] CATH LAB;  Service: Cardiovascular;  Laterality: N/A;  . UMBILICAL HERNIA REPAIR     Family History  Adopted: Yes  Family history unknown: Yes   Social History   Socioeconomic History  . Marital status: Married    Spouse name: Not on file  . Number of children: Not on file  . Years of education: Not on file  . Highest education level: Not on file  Occupational History  . Not on file  Tobacco Use  . Smoking status: Never Smoker  . Smokeless  tobacco: Never Used  Substance and Sexual Activity  . Alcohol use: No  . Drug use: No  . Sexual activity: Not Currently  Other Topics Concern  . Not on file  Social History Narrative  . Not on file   Social Determinants of Health   Financial Resource Strain:   . Difficulty of Paying Living Expenses: Not on file  Food Insecurity:   . Worried About Programme researcher, broadcasting/film/video in the Last Year: Not on file  . Ran Out of Food in the Last Year: Not on file  Transportation Needs:   . Lack of Transportation (Medical): Not on file  . Lack of Transportation (Non-Medical): Not on file  Physical Activity:   . Days of Exercise per Week: Not on file  . Minutes of Exercise per Session: Not on file  Stress:   . Feeling of Stress : Not on file  Social Connections:   . Frequency of Communication with Friends and  Family: Not on file  . Frequency of Social Gatherings with Friends and Family: Not on file  . Attends Religious Services: Not on file  . Active Member of Clubs or Organizations: Not on file  . Attends Archivist Meetings: Not on file  . Marital Status: Not on file    Outpatient Encounter Medications as of 03/02/2019  Medication Sig  . acetaminophen (TYLENOL) 500 MG tablet Take 1,000 mg by mouth every 6 (six) hours as needed for headache.   . albuterol (PROVENTIL HFA;VENTOLIN HFA) 108 (90 Base) MCG/ACT inhaler Inhale 2 puffs into the lungs every 6 (six) hours as needed for wheezing or shortness of breath.  . Alirocumab (PRALUENT) 75 MG/ML SOPN Inject 75 mg into the skin every 14 (fourteen) days.   Marland Kitchen aspirin 81 MG EC tablet Take 1 tablet (81 mg total) by mouth daily.  . BD PEN NEEDLE NANO U/F 32G X 4 MM MISC USE TWICE DAILY WITH LANTUS PEN  . carvedilol (COREG) 6.25 MG tablet TAKE 1/2 TABLET BY MOUTH TWICE DAILY WITH MEALS  . cholecalciferol (VITAMIN D3) 25 MCG (1000 UT) tablet TAKE 1 TABLET BY MOUTH EVERY DAY  . [employer] VITAMIN C 500 MG tablet TAKE 1 TABLET BY MOUTH TWICE A DAY  .  fluticasone (FLONASE) 50 MCG/ACT nasal spray Place 1 spray into both nostrils daily as needed for allergies or rhinitis.  Marland Kitchen gabapentin (NEURONTIN) 100 MG capsule Take 200 mg by mouth at bedtime.   . Insulin Glargine (BASAGLAR KWIKPEN) 100 UNIT/ML SOPN INJECT 40 UNITS SUBCUTANEOUSLY INTO THE SKIN AT BEDTIME  . LIFESCAN FINEPOINT LANCETS MISC Use to test for blood sugar three times daily dx: E11.51  . nitroGLYCERIN (NITROSTAT) 0.4 MG SL tablet PLACE 1 TABLET (0.4 MG TOTAL) UNDER THE TONGUE EVERY 5 (FIVE) MINUTES AS NEEDED FOR CHEST PAIN.  Marland Kitchen ONETOUCH ULTRA test strip USE TO TEST FOR BLOOD SUGAR THREE TIMES DAILY DX: E11.51  . pantoprazole (PROTONIX) 40 MG tablet Take 40 mg by mouth daily.  . pramipexole (MIRAPEX) 1 MG tablet Take 2 tablets (2 mg total) by mouth at bedtime. DX G25.81  . valsartan (DIOVAN) 80 MG tablet Take 80 mg by mouth daily.  . [DISCONTINUED] metFORMIN (GLUCOPHAGE) 500 MG tablet Take 1 tablet (500 mg total) by mouth daily with breakfast.  . metFORMIN (GLUCOPHAGE) 500 MG tablet Take 1 tablet (500 mg total) by mouth daily with breakfast.   No facility-administered encounter medications on file as of 03/02/2019.    Activities of Daily Living In your present state of health, do you have any difficulty performing the following activities: 03/02/2019  Hearing? Y  Vision? N  Difficulty concentrating or making decisions? Y  Walking or climbing stairs? N  Dressing or bathing? N  Doing errands, shopping? N  Preparing Food and eating ? N  Using the Toilet? N  In the past six months, have you accidently leaked urine? Y  Do you have problems with loss of bowel control? N  Managing your Medications? N  Managing your Finances? N  Housekeeping or managing your Housekeeping? N  Some recent data might be hidden    Patient Care Team: Gayland Curry, DO as PCP - General (Geriatric Medicine) Adrian Prows, MD as Consulting Physician (Cardiology) Cameron Sprang, MD as Consulting  Physician (Neurology) [place], [name], Thermal (Optometry) Melida Quitter, MD as Consulting Physician (Otolaryngology)   Assessment:   This is a routine wellness examination for [place].  Exercise Activities and Dietary recommendations Current Exercise Habits:  The patient does not participate in regular exercise at present, Exercise limited by: Other - see comments(lack of motivation)  Goals    . Exercise 3x per week (30 min per time)     Pt will start walking 3 times a week    . Increase water intake     Starting 01/10/16, I will attempt to increase my water intake and try to get around 60oz of water per day.     . Weight (lb) < 170 lb (77.1 kg)     Weight loss with dietary changes, and walking       Fall Risk Fall Risk  03/02/2019 01/22/2019 09/24/2018 08/31/2018 06/01/2018  Falls in the past year? 1 0 0 0 0  Number falls in past yr: 0 0 0 0 0  Injury with Fall? 0 0 0 0 0   Is the patient's home free of loose throw rugs in walkways, pet beds, electrical cords, etc?   yes      Grab bars in the bathroom? no      Handrails on the stairs?   no      Adequate lighting?   yes  Timed Get Up and Go Performed: na  Depression Screen PHQ 2/9 Scores 03/02/2019 09/24/2018 06/01/2018 11/05/2017  PHQ - 2 Score 0 0 0 0    Cognitive Function MMSE - Mini Mental State Exam 04/25/2017 01/10/2016  Orientation to time 5 5  Orientation to Place 5 5  Registration 3 3  Attention/ Calculation 2 5  Recall 2 3  Language- name 2 objects 2 2  Language- repeat 1 1  Language- follow 3 step command 3 3  Language- read & follow direction 1 1  Write a sentence 1 1  Copy design 1 1  Total score 26 30     6CIT Screen 03/02/2019  What Year? 0 points  What month? 0 points  What time? 0 points  Count back from 20 0 points  Months in reverse 2 points  Repeat phrase 0 points  Total Score 2    Immunization History  Administered Date(s) Administered  . Pneumococcal Conjugate-13 12/01/2013  . Td 03/04/1998,  03/01/2017  . Tdap 03/06/2011, 02/28/2017    Qualifies for Shingles Vaccine? Yes recommended  Screening Tests Health Maintenance  Topic Date Due  . Hepatitis C Screening  21-Jan-1951  . PNA vac Low Risk Adult (2 of 2 - PPSV23) 12/02/2014  . FOOT EXAM  05/23/2018  . INFLUENZA VACCINE  10/03/2018  . HEMOGLOBIN A1C  03/18/2019  . OPHTHALMOLOGY EXAM  03/20/2019  . COLONOSCOPY  10/18/2025  . TETANUS/TDAP  03/02/2027   Cancer Screenings: Lung: Low Dose CT Chest recommended if Age 7-80 years, 30 pack-year currently smoking OR have quit w/in 15years. Patient does not qualify. Colorectal: up to date  Additional Screenings:  Hepatitis C Screening:recommended       Plan:     I have personally reviewed and noted the following in the patient's chart:   . Medical and social history . Use of alcohol, tobacco or illicit drugs  . Current medications and supplements . Functional ability and status . Nutritional status . Physical activity . Advanced directives . List of other physicians . Hospitalizations, surgeries, and ER visits in previous 12 months . Vitals . Screenings to include cognitive, depression, and falls . Referrals and appointments  In addition, I have reviewed and discussed with patient certain preventive protocols, quality metrics, and best practice recommendations. A written personalized care plan for  preventive services as well as general preventive health recommendations were provided to patient.     [name], NP  03/02/2019

## 2019-03-02 NOTE — Progress Notes (Signed)
Patient ID: Kenneth Velazquez Sr., male   DOB: 06-03-1950, 68 y.o.   MRN: [id number] This service is provided via telemedicine  No vital signs collected/recorded due to the encounter was a telemedicine visit.   Location of patient (ex: home, work):  HOME  Patient consents to a telephone visit:  YES  Location of the provider (ex: office, home):  [place]  Name of any referring provider:  DR [facility]  Names of all persons participating in the telemedicine service and their role in the encounter:  PATIENT, Edwin Dada, CMA, Sherrie Mustache, NP  Time spent on call:  15:36

## 2019-03-04 ENCOUNTER — Other Ambulatory Visit: Payer: Self-pay

## 2019-03-04 DIAGNOSIS — E7800 Pure hypercholesterolemia, unspecified: Secondary | ICD-10-CM

## 2019-03-04 MED ORDER — PRALUENT 75 MG/ML ~~LOC~~ SOAJ: 75.0000 "pen " | SUBCUTANEOUS | 3 refills | Status: DC

## 2019-03-08 ENCOUNTER — Other Ambulatory Visit: Payer: Self-pay

## 2019-03-09 ENCOUNTER — Other Ambulatory Visit: Payer: Medicare Other

## 2019-03-09 ENCOUNTER — Other Ambulatory Visit: Payer: Self-pay

## 2019-03-09 DIAGNOSIS — IMO0002 Reserved for concepts with insufficient information to code with codable children: Secondary | ICD-10-CM

## 2019-03-09 DIAGNOSIS — G2581 Restless legs syndrome: Secondary | ICD-10-CM | POA: Diagnosis not present

## 2019-03-09 DIAGNOSIS — E611 Iron deficiency: Secondary | ICD-10-CM | POA: Diagnosis not present

## 2019-03-09 DIAGNOSIS — Z1159 Encounter for screening for other viral diseases: Secondary | ICD-10-CM

## 2019-03-09 DIAGNOSIS — E1151 Type 2 diabetes mellitus with diabetic peripheral angiopathy without gangrene: Secondary | ICD-10-CM

## 2019-03-09 DIAGNOSIS — E1165 Type 2 diabetes mellitus with hyperglycemia: Secondary | ICD-10-CM | POA: Diagnosis not present

## 2019-03-09 DIAGNOSIS — I25118 Atherosclerotic heart disease of native coronary artery with other forms of angina pectoris: Secondary | ICD-10-CM

## 2019-03-10 ENCOUNTER — Other Ambulatory Visit: Payer: Self-pay | Admitting: Internal Medicine

## 2019-03-10 DIAGNOSIS — Z20822 Contact with and (suspected) exposure to covid-19: Secondary | ICD-10-CM

## 2019-03-10 LAB — COMPLETE METABOLIC PANEL WITH GFR
AG Ratio: 1.8 (calc) (ref 1.0–2.5)
ALT: 34 U/L (ref 9–46)
AST: 27 U/L (ref 10–35)
Albumin: 4.3 g/dL (ref 3.6–5.1)
Alkaline phosphatase (APISO): 46 U/L (ref 35–144)
BUN: 21 mg/dL (ref 7–25)
CO2: 29 mmol/L (ref 20–32)
Calcium: 10 mg/dL (ref 8.6–10.3)
Chloride: 102 mmol/L (ref 98–110)
Creat: 0.94 mg/dL (ref 0.70–1.25)
GFR, Est African American: 95 mL/min/{1.73_m2} (ref >=60)
GFR, Est Non African American: 82 mL/min/{1.73_m2} (ref >=60)
Globulin: 2.4 g/dL (calc) (ref 1.9–3.7)
Glucose, Bld: 152 mg/dL — ABNORMAL HIGH (ref 65–99)
Potassium: 4.4 mmol/L (ref 3.5–5.3)
Sodium: 137 mmol/L (ref 135–146)
Total Bilirubin: 0.8 mg/dL (ref 0.2–1.2)
Total Protein: 6.7 g/dL (ref 6.1–8.1)

## 2019-03-10 LAB — LIPID PANEL
Cholesterol: 166 mg/dL (ref <200)
HDL: 37 mg/dL — ABNORMAL LOW (ref >=40)
LDL Cholesterol (Calc): 103 mg/dL (calc) — ABNORMAL HIGH
Non-HDL Cholesterol (Calc): 129 mg/dL (calc) (ref <130)
Total CHOL/HDL Ratio: 4.5 (calc) (ref <5.0)
Triglycerides: 159 mg/dL — ABNORMAL HIGH (ref <150)

## 2019-03-10 LAB — CBC WITH DIFFERENTIAL/PLATELET
Absolute Monocytes: 423 cells/uL (ref 200–950)
Basophils Absolute: 31 cells/uL (ref 0–200)
Basophils Relative: 0.6 %
Eosinophils Absolute: 82 cells/uL (ref 15–500)
Eosinophils Relative: 1.6 %
HCT: 44.9 % (ref 38.5–50.0)
Hemoglobin: 15.7 g/dL (ref 13.2–17.1)
Lymphs Abs: 1306 cells/uL (ref 850–3900)
MCH: 30.4 pg (ref 27.0–33.0)
MCHC: 35 g/dL (ref 32.0–36.0)
MCV: 86.8 fL (ref 80.0–100.0)
MPV: 11.4 fL (ref 7.5–12.5)
Monocytes Relative: 8.3 %
Neutro Abs: 3259 cells/uL (ref 1500–7800)
Neutrophils Relative %: 63.9 %
Platelets: 117 10*3/uL — ABNORMAL LOW (ref 140–400)
RBC: 5.17 10*6/uL (ref 4.20–5.80)
RDW: 12.2 % (ref 11.0–15.0)
Total Lymphocyte: 25.6 %
WBC: 5.1 10*3/uL (ref 3.8–10.8)

## 2019-03-10 LAB — HEMOGLOBIN A1C
Hgb A1c MFr Bld: 9.2 % of total Hgb — ABNORMAL HIGH (ref <5.7)
Mean Plasma Glucose: 217 (calc)
eAG (mmol/L): 12 (calc)

## 2019-03-10 LAB — HEPATITIS C ANTIBODY
Hepatitis C Ab: NONREACTIVE
SIGNAL TO CUT-OFF: 0.01 (ref <1.00)

## 2019-03-10 LAB — FERRITIN: Ferritin: 61 ng/mL (ref 24–380)

## 2019-03-10 NOTE — Progress Notes (Signed)
Ferritin (iron marker with restless legs) is normal. Electrolytes and kidneys are normal Liver normal Platelets mildly low Bad cholesterol has increased again--suspect he's not been taking his injections

## 2019-03-15 ENCOUNTER — Other Ambulatory Visit: Payer: Self-pay

## 2019-03-15 ENCOUNTER — Encounter: Payer: Self-pay | Admitting: Internal Medicine

## 2019-03-15 ENCOUNTER — Ambulatory Visit (INDEPENDENT_AMBULATORY_CARE_PROVIDER_SITE_OTHER): Payer: Medicare Other | Admitting: Internal Medicine

## 2019-03-15 VITALS — BP 138/78 | HR 65 | Temp 97.1°F | Ht 68.0 in | Wt 190.0 lb

## 2019-03-15 DIAGNOSIS — Z794 Long term (current) use of insulin: Secondary | ICD-10-CM | POA: Diagnosis not present

## 2019-03-15 DIAGNOSIS — E1165 Type 2 diabetes mellitus with hyperglycemia: Secondary | ICD-10-CM | POA: Diagnosis not present

## 2019-03-15 DIAGNOSIS — E785 Hyperlipidemia, unspecified: Secondary | ICD-10-CM | POA: Diagnosis not present

## 2019-03-15 DIAGNOSIS — I25118 Atherosclerotic heart disease of native coronary artery with other forms of angina pectoris: Secondary | ICD-10-CM

## 2019-03-15 DIAGNOSIS — G2581 Restless legs syndrome: Secondary | ICD-10-CM | POA: Diagnosis not present

## 2019-03-15 DIAGNOSIS — J410 Simple chronic bronchitis: Secondary | ICD-10-CM

## 2019-03-15 DIAGNOSIS — K5909 Other constipation: Secondary | ICD-10-CM | POA: Diagnosis not present

## 2019-03-15 DIAGNOSIS — E1159 Type 2 diabetes mellitus with other circulatory complications: Secondary | ICD-10-CM

## 2019-03-15 DIAGNOSIS — IMO0002 Reserved for concepts with insufficient information to code with codable children: Secondary | ICD-10-CM

## 2019-03-15 MED ORDER — METFORMIN HCL 500 MG PO TABS: 500.0000 mg | ORAL_TABLET | Freq: Two times a day (BID) | ORAL | 0 refills | Status: DC

## 2019-03-15 NOTE — Patient Instructions (Addendum)
Take your medication as prescribed.  Increase your hydration with water and eat more fruits and vegetables, less meat and fast foods.   Avoid the night time sweets.  If you must, have a piece of fruit instead.  Increase your metformin to twice a day with meals.  I recommend you check with Dr. [name] about costs of alternative injectable cholesterol meds you can treat your cholesterol with medication, as well.  The levels are very high.    What is the Mediterranean diet? The Mediterranean diet is a way of eating based on the traditional cuisine of countries bordering the [employer]. While there is no single definition of the Mediterranean diet, it is typically high in vegetables, fruits, whole grains, beans, nut and seeds, and olive oil.  The main components of Mediterranean diet include:  Daily consumption of vegetables, fruits, whole grains and healthy fats Weekly intake of fish, poultry, beans and eggs Moderate portions of dairy products Limited intake of red meat Other important elements of the Mediterranean diet are sharing meals with family and friends, enjoying a glass of red wine and being physically active.  Plant based, not meat based The foundation of the Mediterranean diet is vegetables, fruits, herbs, nuts, beans and whole grains. Meals are built around these plant-based foods. Moderate amounts of dairy, poultry and eggs are also central to the Mediterranean Diet, as is seafood. In contrast, red meat is eaten only occasionally.  Healthy fats Healthy fats are a mainstay of the Mediterranean diet. They're eaten instead of less healthy fats, such as saturated and trans fats, which contribute to heart disease.  Olive oil is the primary source of added fat in the Mediterranean diet. Olive oil provides monounsaturated fat, which has been found to lower total cholesterol and low-density lipoprotein (LDL or "bad") cholesterol levels. Nuts and seeds also contain monounsaturated  fat.  Fish are also important in the Mediterranean diet. Fatty fish -- such as mackerel, herring, sardines, albacore tuna, salmon and lake trout -- are rich in omega-3 fatty acids, a type of polyunsaturated fat that may reduce inflammation in the body. Omega-3 fatty acids also help decrease triglycerides, reduce blood clotting, and decrease the risk of stroke and heart failure.  What about wine? The Mediterranean diet typically allows red wine in moderation. Although alcohol has been associated with a reduced risk of heart disease in some studies, it's by no means risk free. The Dietary Guidelines for Americans caution against beginning to drink or drinking more often on the basis of potential health benefits.  Eating the Mediterranean way Interested in trying the Mediterranean diet? These tips will help you get started:  Eat more fruits and vegetables. Aim for 7 to 10 servings a day of fruit and vegetables. Opt for whole grains. Switch to whole-grain bread, cereal and pasta. Experiment with other whole grains, such as bulgur and farro. Use healthy fats. Try olive oil as a replacement for butter when cooking. Instead of putting butter or margarine on bread, try dipping it in flavored olive oil. Eat more seafood. Eat fish twice a week. Fresh or water-packed tuna, salmon, trout, mackerel and herring are healthy choices. Grilled fish tastes good and requires little cleanup. Avoid deep-fried fish. Reduce red meat. Substitute fish, poultry or beans for meat. If you eat meat, make sure it's lean and keep portions small. Enjoy some dairy. Eat low-fat [place] or plain yogurt and small amounts of a variety of cheeses. Spice it up. Herbs and spices boost flavor and lessen  the need for salt. The Mediterranean diet is a delicious and healthy way to eat. Many people who switch to this style of eating say they'll never eat any other way.

## 2019-03-15 NOTE — Progress Notes (Signed)
Location:  [facility] clinic  Provider: Dr. Bufford Spikes   Goals of Care:  Advanced Directives 03/15/2019  Does Patient Have a Medical Advance Directive? No  Does patient want to make changes to medical advance directive? No - Patient declined  Would patient like information on creating a medical advance directive? -  Pre-existing out of facility DNR order (yellow form or pink MOST form) -     Chief Complaint  Patient presents with  . Medical Management of Chronic Issues    Follow up,     HPI: Patient is a 69 y.o. King seen today for medical management of chronic diseases.     Labs reviewed with patient.   He is upset his A1C is elevated. 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 He has long periods of fasting between meals. He will eat ice cream and candy bars in the evening when he has a sweet tooth. Taking his long acting insulin at night. Currently out of metformin and waiting on his pharmacy to refill his prescription. Asking about diabetic diet that will not elevate cholesterol.   Takes his blood pressure medication valsartan and carvedilol daily. Follows low sodium diet. Stopped eating canned soup. Denies any blurred vision or headaches. Has had one episode of chest pain in October. He did not use his nitroglycerin during this episode. He went and saw his cardiologist, EKG was done. No further interventions. He has not had chest pain since then.   He is having trouble breathing when he bends over. There is no other time he has trouble breathing. When this occurs he will rise slowly and take a few deep breaths. He has discussed this symptom with his cardiologist. If trouble breathing persists he will contact cardiologist.   Denies any changes with cough or congestion.   Restless legs have not improved. He is still taking the pramipexole at dinnertime and gabapentin at bedtime. He will vary with the doses of both medications. Asking if taking gabapentin as  prescribed will help symptoms at night. Also asking if he can take gabapentin around dinnertime and pramipexole at bedtime.    At this time he cannot afford afford his injectable cholesterol medications. Samples are also not available at his cardiologist.   New complaint of constipation. It will sometimes take him a week to have bowel movement. Admits to drinking lots of water throughout the day.   No recent injuries or falls.   Next eye exam April 2021.   Refusing flu and pneumonia vaccine due previous side effects experienced in the past.          Past Medical History:  Diagnosis Date  . Arthritis    "left thumb" (05/23/2017)  . Barrett's esophagus    "we've been told that it's all gone; still take RX for GERD" (05/23/2017)  . CAD (coronary artery disease), native coronary artery    Coronary angiogram 05/03/2014:  Proximal LAD 3.0x12 mm Promus premier stent.  04/08/2014: Mid Cx 3.5 x 16 mm Promus DES.  03/29/2013: Mid LAD 2.75 x 38 mm Promus Premier drug-eluting stent, balloon angioplasty of D1 and distal LAD and stenting of distal RCA with 2.75 x 24 mm promos Premier drug-eluting stent 12/15/2012 widely patent.  . Cervicalgia   . Chronic bronchitis (HCC)    "q yr" (05/23/2017)  . Complication of anesthesia    " I do not wake up very well "  . Family history of adverse reaction to anesthesia    "  son w/PONV"  . GERD (gastroesophageal reflux disease)   . Heart murmur    "grew out of it"   . Hyperlipidemia   . Hypoglycemia, unspecified   . IDDM (insulin dependent diabetes mellitus)   . Impotence of organic origin   . Iron deficiency anemia   . Other malaise and fatigue   . PONV (postoperative nausea and vomiting)   . Restless leg   . Tension headache    "sometimes" (05/23/2017)  . Unspecified gastritis and gastroduodenitis with hemorrhage   . Unstable angina pectoris ([place]) 04/08/2014   Coronary angiogram 05/03/2014:  Proximal LAD 3.0x12 mm Promus premier stent.  04/08/2014: Mid Cx  3.5 x 16 mm Promus DES.  03/29/2013: Mid LAD 2.75 x 38 mm Promus Premier drug-eluting stent, balloon angioplasty of D1 and distal LAD and stenting of distal RCA with 2.75 x 24 mm promos Premier drug-eluting stent 12/15/2012 widely patent.    Past Surgical History:  Procedure Laterality Date  . CARDIAC CATHETERIZATION N/A 09/25/2015   Procedure: Left Heart Cath and Coronary Angiography;  Surgeon: Adrian Prows, MD;  Location: [place] [facility];  Service: Cardiovascular;  Laterality: N/A;  . CARDIAC CATHETERIZATION N/A 09/25/2015   Procedure: Intravascular Pressure Wire/FFR Study;  Surgeon: Adrian Prows, MD;  Location: [place] [facility];  Service: Cardiovascular;  Laterality: N/A;  . CORONARY ANGIOPLASTY WITH STENT PLACEMENT  12/15/2012; 04/28/2014; 05/03/2014   "2; 1; 1"   . FRACTIONAL FLOW RESERVE WIRE Right 03/26/2013   Procedure: FRACTIONAL FLOW RESERVE WIRE;  Surgeon: Laverda Page, MD;  Location: [facility] CATH LAB;  Service: Cardiovascular;  Laterality: Right;  . FRACTIONAL FLOW RESERVE WIRE N/A 05/03/2014   Procedure: FRACTIONAL FLOW RESERVE WIRE;  Surgeon: Laverda Page, MD;  Location: [facility] CATH LAB;  Service: Cardiovascular;  Laterality: N/A;  . HERNIA REPAIR  6789   "umbilical"   . INCISION AND DRAINAGE ABSCESS Left 05/2007   "groin"   . KNEE SURGERY Right 1992;  2000   "calcium deposits removed" (12/15/2012)  . LEFT HEART CATH AND CORONARY ANGIOGRAPHY N/A 05/23/2017   Procedure: LEFT HEART CATH AND CORONARY ANGIOGRAPHY;  Surgeon: Adrian Prows, MD;  Location: [place] [facility];  Service: Cardiovascular;  Laterality: N/A;  . LEFT HEART CATHETERIZATION WITH CORONARY ANGIOGRAM N/A 12/15/2012   Procedure: LEFT HEART CATHETERIZATION WITH CORONARY ANGIOGRAM;  Surgeon: Laverda Page, MD;  Location: [facility] CATH LAB;  Service: Cardiovascular;  Laterality: N/A;  . LEFT HEART CATHETERIZATION WITH CORONARY ANGIOGRAM N/A 03/26/2013   Procedure: LEFT HEART CATHETERIZATION WITH CORONARY ANGIOGRAM;  Surgeon:  Laverda Page, MD;  Location: [facility] CATH LAB;  Service: Cardiovascular;  Laterality: N/A;  . LEFT HEART CATHETERIZATION WITH CORONARY ANGIOGRAM N/A 04/08/2014   Procedure: LEFT HEART CATHETERIZATION WITH CORONARY ANGIOGRAM;  Surgeon: Blane Ohara, MD;  Location: [facility] CATH LAB;  Service: Cardiovascular;  Laterality: N/A;  . PERCUTANEOUS CORONARY STENT INTERVENTION (PCI-S) N/A 05/03/2014   Procedure: PERCUTANEOUS CORONARY STENT INTERVENTION (PCI-S);  Surgeon: Laverda Page, MD;  Location: [facility] CATH LAB;  Service: Cardiovascular;  Laterality: N/A;  . UMBILICAL HERNIA REPAIR      Allergies  Allergen Reactions  . Lyrica [Pregabalin] Other (See Comments)    Made patient very lethargic the next morning, very hard to patient to function, move, etc.   . Statins Other (See Comments)    Causes Restless Legs, rhabdomyolysis  . Metformin And Related     GI upset  . Trulicity [Dulaglutide] Other (See Comments)    GI side  effects     Outpatient Encounter Medications as of 03/15/2019  Medication Sig  . acetaminophen (TYLENOL) 500 MG tablet Take 1,000 mg by mouth every 6 (six) hours as needed for headache.   . albuterol (PROVENTIL HFA;VENTOLIN HFA) 108 (90 Base) MCG/ACT inhaler Inhale 2 puffs into the lungs every 6 (six) hours as needed for wheezing or shortness of breath.  Marland Kitchen aspirin 81 MG EC tablet Take 1 tablet (81 mg total) by mouth daily.  . BD PEN NEEDLE NANO U/F 32G X 4 MM MISC USE TWICE DAILY WITH LANTUS PEN  . carvedilol (COREG) 6.25 MG tablet TAKE 1/2 TABLET BY MOUTH TWICE DAILY WITH MEALS  . [employer] VITAMIN C 500 MG tablet TAKE 1 TABLET BY MOUTH TWICE DAILY  . D-5000 125 MCG (5000 UT) TABS TAKE 1 TABLET BY MOUTH EVERY DAY  . fluticasone (FLONASE) 50 MCG/ACT nasal spray Place 1 spray into both nostrils daily as needed for allergies or rhinitis.  Marland Kitchen gabapentin (NEURONTIN) 100 MG capsule Take 200 mg by mouth at bedtime.   . Insulin Glargine (BASAGLAR KWIKPEN) 100 UNIT/ML SOPN INJECT 40 UNITS  SUBCUTANEOUSLY INTO THE SKIN AT BEDTIME  . LIFESCAN FINEPOINT LANCETS MISC Use to test for blood sugar three times daily dx: E11.51  . nitroGLYCERIN (NITROSTAT) 0.4 MG SL tablet PLACE 1 TABLET (0.4 MG TOTAL) UNDER THE TONGUE EVERY 5 (FIVE) MINUTES AS NEEDED FOR CHEST PAIN.  Marland Kitchen ONETOUCH ULTRA test strip USE TO TEST FOR BLOOD SUGAR THREE TIMES DAILY DX: E11.51  . pantoprazole (PROTONIX) 40 MG tablet Take 40 mg by mouth daily.  . pramipexole (MIRAPEX) 1 MG tablet Take 2 tablets (2 mg total) by mouth at bedtime. DX G25.81  . valsartan (DIOVAN) 80 MG tablet Take 80 mg by mouth daily.  . Alirocumab (PRALUENT) 75 MG/ML SOAJ Inject 75 pens into the skin every 14 (fourteen) days. (Patient not taking: Reported on 03/15/2019)  . Alirocumab (PRALUENT) 75 MG/ML SOPN Inject 75 mg into the skin every 14 (fourteen) days.   . metFORMIN (GLUCOPHAGE) 500 MG tablet Take 1 tablet (500 mg total) by mouth daily with breakfast. (Patient not taking: Reported on 03/15/2019)   No facility-administered encounter medications on file as of 03/15/2019.    Review of Systems:  Review of Systems  Constitutional: Negative for activity change, appetite change and fatigue.  HENT: Negative for dental problem, hearing loss and trouble swallowing.   Eyes: Negative for photophobia and visual disturbance.  Respiratory: Positive for shortness of breath. Negative for cough and wheezing.   Cardiovascular: Positive for chest pain. Negative for palpitations and leg swelling.  Gastrointestinal: Positive for constipation. Negative for abdominal pain, diarrhea and nausea.  Endocrine: Negative for polydipsia, polyphagia and polyuria.  Genitourinary: Negative for dysuria and hematuria.  Musculoskeletal:       Muscle cramps  Skin: Negative.   Neurological: Positive for tremors. Negative for dizziness, weakness and headaches.       Restless legs, paresthesias  Psychiatric/Behavioral: Negative for dysphoric mood and sleep disturbance. The  patient is not nervous/anxious.   All other systems reviewed and are negative.   Health Maintenance  Topic Date Due  . PNA vac Low Risk Adult (2 of 2 - PPSV23) 12/02/2014  . FOOT EXAM  05/23/2018  . INFLUENZA VACCINE  10/03/2018  . OPHTHALMOLOGY EXAM  03/20/2019  . HEMOGLOBIN A1C  09/06/2019  . COLONOSCOPY  10/18/2025  . TETANUS/TDAP  03/02/2027  . Hepatitis C Screening  Completed    Physical Exam: There were no  vitals filed for this visit. There is no height or weight on file to calculate BMI. Physical Exam Vitals reviewed.  Constitutional:      General: He is not in acute distress.    Appearance: Normal appearance. He is normal weight.  HENT:     Head: Normocephalic.  Cardiovascular:     Rate and Rhythm: Normal rate and regular rhythm.     Pulses: Normal pulses.          Dorsalis pedis pulses are 2+ on the right side and 2+ on the left side.       Posterior tibial pulses are 2+ on the right side and 2+ on the left side.     Heart sounds: Normal heart sounds. No murmur.  Pulmonary:     Effort: Pulmonary effort is normal. No respiratory distress.     Breath sounds: Normal breath sounds.  Abdominal:     General: Abdomen is flat. Bowel sounds are normal.     Palpations: Abdomen is soft.  Musculoskeletal:     Right lower leg: No edema.     Left lower leg: No edema.  Feet:     Right foot:     Protective Sensation: 10 sites tested. 10 sites sensed.     Skin integrity: Skin integrity normal.     Toenail Condition: Right toenails are normal.     Left foot:     Protective Sensation: 10 sites tested. 10 sites sensed.     Skin integrity: Skin integrity normal.     Toenail Condition: Left toenails are normal.  Skin:    General: Skin is warm and dry.     Capillary Refill: Capillary refill takes less than 2 seconds.  Neurological:     General: No focal deficit present.     Mental Status: He is alert and oriented to person, place, and time. Mental status is at baseline.   Psychiatric:        Mood and Affect: Mood normal.        Behavior: Behavior normal.        Thought Content: Thought content normal.        Judgment: Judgment normal.     Labs reviewed: Basic Metabolic Panel: Recent Labs    04/20/18 1414 05/28/18 0807 03/09/19 0832  NA 138 138 137  K 4.1 4.5 4.4  CL 103 103 102  CO2 24 30 29   GLUCOSE 130* 135* 152*  BUN 11 18 21   CREATININE 0.98 1.16 0.94  CALCIUM 9.6 9.2 10.0   Liver Function Tests: Recent Labs    04/20/18 1414 03/09/19 0832  AST 24 27  ALT Kenneth 34  ALKPHOS 48  --   BILITOT 1.1 0.8  PROT 7.2 6.7  ALBUMIN 4.3  --    Recent Labs    04/20/18 1414  LIPASE 19   No results for input(s): AMMONIA in the last 8760 hours. CBC: Recent Labs    04/20/18 1414 03/09/19 0832  WBC 8.4 5.1  NEUTROABS  --  3,259  HGB 16.6 15.7  HCT 49.2 44.9  MCV 87.5 86.8  PLT 134* 117*   Lipid Panel: Recent Labs    03/09/19 0832  CHOL 166  HDL 37*  LDLCALC 103*  TRIG 159*  CHOLHDL 4.5   Lab Results  Component Value Date   HGBA1C 9.2 (H) 03/09/2019    Procedures since last visit: No results found.  Assessment/Plan 1. Uncontrolled diabetes mellitus with circulatory complication, with long-term current use of insulin (  HCC) - A1C elevated to 9.2 - suspect he is not taking his metformin as prescribed and still eating poorly - risks for cardiovascular event educated to patient - will increase metformin 500 mg two times daily with meals - continue current insulin glargine at 40 units at bedtime - recommend following a mediterranean diet - avoid nighttime sweets like ice cream or candy, substitute with fruit - hemoglobin A1C- future - complete metabolic panel with GFR- future  2. Coronary artery disease of native artery of native heart with stable angina pectoris (HCC) - followed by Dr. Jacinto HalimGanji with cardiology - stable at this time, no new reports of chest pain - continue asa 81 mg daily - cbc with differential/platelets-  future  3. Restless legs syndrome (RLS) - unchanged from last visit since he continues to not take his medication consistiently, as prescribed - past ferritin levels normal - recommend taking pramipexole in evening and gabapentin in evening to prevent daytime drowsiness - recommend full dose of gabapentin consistiently for a few weeks to evaluate if medication is effective with helping nerve pain  4. Hyperlipidemia, unspecified hyperlipidemia type - LDL elevated to 103 - he is having trouble affording costly Praluent injections, his insurance has recommend other similar medications - recommend contacting Dr. Jacinto HalimGanji office and discuss PCSK9 alternative - recommend following mediterranean diet - lipid panel- 3 months  5. Other constipation - bowel movements vary from a few days to over a week - suspect poor dietary habits as main cause - recommend OTC stool softner daily - encourage hydration daily - recommend diet rich in fruits and vegetables - may use OTC miralax if bowel have not moved after 4-5 days  6. Simple chronic bronchitis (HCC) - stable at this time, no complaints of cough   Labs/tests ordered:  Cbc with differential/platelets, complete metabolic panel with GFR, lipid panel, hemoglobin A1C Next appt:  3 month follow up

## 2019-03-17 ENCOUNTER — Other Ambulatory Visit: Payer: Self-pay | Admitting: *Deleted

## 2019-03-17 ENCOUNTER — Other Ambulatory Visit: Payer: Self-pay

## 2019-03-17 DIAGNOSIS — IMO0002 Reserved for concepts with insufficient information to code with codable children: Secondary | ICD-10-CM

## 2019-03-17 DIAGNOSIS — E1159 Type 2 diabetes mellitus with other circulatory complications: Secondary | ICD-10-CM

## 2019-03-17 MED ORDER — METFORMIN HCL 500 MG PO TABS: 500.0000 mg | ORAL_TABLET | Freq: Two times a day (BID) | ORAL | 1 refills | Status: DC

## 2019-03-17 MED ORDER — VALSARTAN 80 MG PO TABS: 80.0000 mg | ORAL_TABLET | Freq: Every day | ORAL | 3 refills | Status: DC

## 2019-03-17 NOTE — Telephone Encounter (Signed)
Patient called and stated that he needed a refill on Metformin.  Pended Rx and sent to Dr. Renato Gails for approval due to [employer].

## 2019-03-26 ENCOUNTER — Other Ambulatory Visit: Payer: Self-pay | Admitting: Internal Medicine

## 2019-03-26 ENCOUNTER — Telehealth: Payer: Self-pay

## 2019-03-26 DIAGNOSIS — G2581 Restless legs syndrome: Secondary | ICD-10-CM

## 2019-03-26 NOTE — Telephone Encounter (Signed)
Yes. He has been on this for a while

## 2019-03-26 NOTE — Telephone Encounter (Signed)
Patient called requesting Praulent 75mg  injection be sent to the pharmacy, but his medication list did not reflect that, so I added it to his medication list since he reported it. Is it ok to send this medication in to his pharmacy? Please advise.

## 2019-03-29 ENCOUNTER — Other Ambulatory Visit: Payer: Self-pay

## 2019-03-29 MED ORDER — PRALUENT 75 MG/ML ~~LOC~~ SOAJ: 75.0000 mg | SUBCUTANEOUS | 6 refills | Status: DC

## 2019-03-29 NOTE — Telephone Encounter (Signed)
Done

## 2019-03-31 ENCOUNTER — Other Ambulatory Visit: Payer: Self-pay

## 2019-03-31 ENCOUNTER — Telehealth (INDEPENDENT_AMBULATORY_CARE_PROVIDER_SITE_OTHER): Payer: Medicare Other | Admitting: Family

## 2019-03-31 ENCOUNTER — Encounter: Payer: Self-pay | Admitting: Family

## 2019-03-31 DIAGNOSIS — I25118 Atherosclerotic heart disease of native coronary artery with other forms of angina pectoris: Secondary | ICD-10-CM

## 2019-03-31 DIAGNOSIS — J209 Acute bronchitis, unspecified: Secondary | ICD-10-CM

## 2019-03-31 DIAGNOSIS — J42 Unspecified chronic bronchitis: Secondary | ICD-10-CM

## 2019-03-31 MED ORDER — CLARITHROMYCIN 500 MG PO TABS: 500.0000 mg | ORAL_TABLET | Freq: Two times a day (BID) | ORAL | 0 refills | Status: AC

## 2019-03-31 NOTE — Progress Notes (Signed)
Patient ID: Kenneth Nix Sr., male   DOB: Oct 13, 1950, 69 y.o.   MRN: [id number] This service is provided via telemedicine  No vital signs collected/recorded due to the encounter was a telemedicine visit.   Location of patient (ex: home, work):  HOME  Patient consents to a telephone visit:  YES  Location of the provider (ex: office, home):  OFFICE  Name of any referring provider:  TIFFANY REED, DO  Names of all persons participating in the telemedicine service and their role in the encounter:  PATIENT, [name], CMA, Richarda Blade, NP  Time spent on call:  4:34  Provider: Rondalyn Belford FNP-C  Kermit Balo, DO  Patient Care Team: Kermit Balo, DO as PCP - General (Geriatric Medicine) [name], MD as Consulting Physician (Cardiology) Van Clines, MD as Consulting Physician (Neurology) [name], [name], OD (Optometry) [name], MD as Consulting Physician (Otolaryngology)  Extended Emergency Contact Information Primary Emergency Contact: Canizales,Pam Address: [address]          [place], [place] [address] [place] Phone: [phone] Relation: Spouse Secondary Emergency Contact: Cervone,Brian  [place] Mobile Phone: [phone] Relation: Son  Code Status: Full Code  Goals of care: Advanced Directive information Advanced Directives 03/15/2019  Does Patient Have a Medical Advance Directive? No  Does patient want to make changes to medical advance directive? No - Patient declined  Would patient like information on creating a medical advance directive? -  Pre-existing out of facility DNR order (yellow form or pink MOST form) -     Chief Complaint  Patient presents with  . Acute Visit     COUGH    HPI:  Pt is a 69 y.o. male seen today for an acute visit for evaluation of cough.He states went on a long trip yesterday today woke up with a hoarse voice,cough and feels congested in the chest.Cough is described as dry.He has  had runny nose though this is chronic.He had had no fever,chills.loss of taste/smell,N/V/D,body aches or shortness of breath.He states has bronchitis which worsen from time to time.he states takes Biaxin to prevent worsening.Z-pak does not work well for him. Temp 98.2 this morning. He does have albuterol inhaler every 6 hours as needed but has not required.     Past Medical History:  Diagnosis Date  . Arthritis    "left thumb" (05/23/2017)  . Barrett's esophagus    "we've been told that it's all gone; still take RX for GERD" (05/23/2017)  . CAD (coronary artery disease), native coronary artery    Coronary angiogram 05/03/2014:  Proximal LAD 3.0x12 mm Promus premier stent.  04/08/2014: Mid Cx 3.5 x 16 mm Promus DES.  03/29/2013: Mid LAD 2.75 x 38 mm Promus Premier drug-eluting stent, balloon angioplasty of D1 and distal LAD and stenting of distal RCA with 2.75 x 24 mm promos Premier drug-eluting stent 12/15/2012 widely patent.  . Cervicalgia   . Chronic bronchitis (HCC)    "q yr" (05/23/2017)  . Complication of anesthesia    " I do not wake up very well "  . Family history of adverse reaction to anesthesia    "son w/PONV"  . GERD (gastroesophageal reflux disease)   . Heart murmur    "grew out of it"   . Hyperlipidemia   . Hypoglycemia, unspecified   . IDDM (insulin dependent diabetes mellitus)   . Impotence of organic origin   . Iron deficiency anemia   . Other malaise and fatigue   .  PONV (postoperative nausea and vomiting)   . Restless leg   . Tension headache    "sometimes" (05/23/2017)  . Unspecified gastritis and gastroduodenitis with hemorrhage   . Unstable angina pectoris ([place]) 04/08/2014   Coronary angiogram 05/03/2014:  Proximal LAD 3.0x12 mm Promus premier stent.  04/08/2014: Mid Cx 3.5 x 16 mm Promus DES.  03/29/2013: Mid LAD 2.75 x 38 mm Promus Premier drug-eluting stent, balloon angioplasty of D1 and distal LAD and stenting of distal RCA with 2.75 x 24 mm promos Premier drug-eluting  stent 12/15/2012 widely patent.   Past Surgical History:  Procedure Laterality Date  . CARDIAC CATHETERIZATION N/A 09/25/2015   Procedure: Left Heart Cath and Coronary Angiography;  Surgeon: Adrian Prows, MD;  Location: [place] [facility];  Service: Cardiovascular;  Laterality: N/A;  . CARDIAC CATHETERIZATION N/A 09/25/2015   Procedure: Intravascular Pressure Wire/FFR Study;  Surgeon: Adrian Prows, MD;  Location: [facility];  Service: Cardiovascular;  Laterality: N/A;  . CORONARY ANGIOPLASTY WITH STENT PLACEMENT  12/15/2012; 04/28/2014; 05/03/2014   "2; 1; 1"   . FRACTIONAL FLOW RESERVE WIRE Right 03/26/2013   Procedure: FRACTIONAL FLOW RESERVE WIRE;  Surgeon: Laverda Page, MD;  Location: [facility] C[facility] LAB;  Service: Cardiovascular;  Laterality: Right;  . FRACTIONAL FLOW RESERVE WIRE N/A 05/03/2014   Procedure: FRACTIONAL FLOW RESERVE WIRE;  Surgeon: Laverda Page, MD;  Location: [facility] CATH LAB;  Service: Cardiovascular;  Laterality: N/A;  . HERNIA REPAIR  [id number]   "umbilical"   . INCISION AND DRAINAGE ABSCESS Left 05/2007   "groin"   . KNEE SURGERY Right 1992;  2000   "calcium deposits removed" (12/15/2012)  . LEFT HEART CATH AND CORONARY ANGIOGRAPHY N/A 05/23/2017   Procedure: LEFT HEART CATH AND CORONARY ANGIOGRAPHY;  Surgeon: Adrian Prows, MD;  Location: [place] [facility];  Service: Cardiovascular;  Laterality: N/A;  . LEFT HEART CATHETERIZATION WITH CORONARY ANGIOGRAM N/A 12/15/2012   Procedure: LEFT HEART CATHETERIZATION WITH CORONARY ANGIOGRAM;  Surgeon: Laverda Page, MD;  Location: [facility] CATH LAB;  Service: Cardiovascular;  Laterality: N/A;  . LEFT HEART CATHETERIZATION WITH CORONARY ANGIOGRAM N/A 03/26/2013   Procedure: LEFT HEART CATHETERIZATION WITH CORONARY ANGIOGRAM;  Surgeon: Laverda Page, MD;  Location: [facility] CATH LAB;  Service: Cardiovascular;  Laterality: N/A;  . LEFT HEART CATHETERIZATION WITH CORONARY ANGIOGRAM N/A 04/08/2014   Procedure: LEFT HEART CATHETERIZATION WITH CORONARY  ANGIOGRAM;  Surgeon: Blane Ohara, MD;  Location: [facility] CATH LAB;  Service: Cardiovascular;  Laterality: N/A;  . PERCUTANEOUS CORONARY STENT INTERVENTION (PCI-S) N/A 05/03/2014   Procedure: PERCUTANEOUS CORONARY STENT INTERVENTION (PCI-S);  Surgeon: Laverda Page, MD;  Location: [facility] CATH LAB;  Service: Cardiovascular;  Laterality: N/A;  . UMBILICAL HERNIA REPAIR      Allergies  Allergen Reactions  . Lyrica [Pregabalin] Other (See Comments)    Made patient very lethargic the next morning, very hard to patient to function, move, etc.   . Statins Other (See Comments)    Causes Restless Legs, rhabdomyolysis  . Metformin And Related     GI upset  . Trulicity [Dulaglutide] Other (See Comments)    GI side effects     Outpatient Encounter Medications as of 03/31/2019  Medication Sig  . acetaminophen (TYLENOL) 500 MG tablet Take 1,000 mg by mouth every 6 (six) hours as needed for headache.   . albuterol (PROVENTIL HFA;VENTOLIN HFA) 108 (90 Base) MCG/ACT inhaler Inhale 2 puffs into the lungs every 6 (six) hours as needed for wheezing or  shortness of breath.  . Alirocumab (PRALUENT) 75 MG/ML SOAJ Inject 75 mg into the skin every 14 (fourteen) days.  Marland Kitchen aspirin 81 MG EC tablet Take 1 tablet (81 mg total) by mouth daily.  . BD PEN NEEDLE NANO U/F 32G X 4 MM MISC USE TWICE DAILY WITH LANTUS PEN  . carvedilol (COREG) 6.25 MG tablet TAKE 1/2 TABLET BY MOUTH TWICE DAILY WITH MEALS  . [employer] VITAMIN C 500 MG tablet TAKE 1 TABLET BY MOUTH TWICE DAILY  . D-5000 125 MCG (5000 UT) TABS TAKE 1 TABLET BY MOUTH EVERY DAY  . fluticasone (FLONASE) 50 MCG/ACT nasal spray Place 1 spray into both nostrils daily as needed for allergies or rhinitis.  Marland Kitchen gabapentin (NEURONTIN) 100 MG capsule Take 200 mg by mouth daily after supper.  . Insulin Glargine (BASAGLAR KWIKPEN) 100 UNIT/ML SOPN INJECT 40 UNITS SUBCUTANEOUSLY INTO THE SKIN AT BEDTIME  . LIFESCAN FINEPOINT LANCETS MISC Use to test for blood sugar three times daily  dx: E11.51  . metFORMIN (GLUCOPHAGE) 500 MG tablet Take 1 tablet (500 mg total) by mouth 2 (two) times daily with a meal.  . nitroGLYCERIN (NITROSTAT) 0.4 MG SL tablet PLACE 1 TABLET (0.4 MG TOTAL) UNDER THE TONGUE EVERY 5 (FIVE) MINUTES AS NEEDED FOR CHEST PAIN.  Marland Kitchen ONETOUCH ULTRA test strip USE TO TEST FOR BLOOD SUGAR THREE TIMES DAILY DX: E11.51  . pantoprazole (PROTONIX) 40 MG tablet Take 40 mg by mouth daily.  . pramipexole (MIRAPEX) 1 MG tablet TAKE 2 TABLETS (2 MG TOTAL) BY MOUTH AT BEDTIME. DX G25.81  . valsartan (DIOVAN) 80 MG tablet Take 1 tablet (80 mg total) by mouth daily.   No facility-administered encounter medications on file as of 03/31/2019.    Review of Systems  Constitutional: Negative for appetite change, chills, fatigue and fever.  HENT: Negative for congestion, sinus pressure, sinus pain, sneezing and sore throat.        Hoarse.chronic runny nose has not worsen.   Respiratory: Positive for cough. Negative for chest tightness, shortness of breath and wheezing.   Cardiovascular: Negative for chest pain, palpitations and leg swelling.  Gastrointestinal: Negative for abdominal distention, abdominal pain, constipation, diarrhea, nausea and vomiting.  Musculoskeletal: Negative for myalgias.  Neurological: Negative for dizziness, light-headedness and headaches.    Immunization History  Administered Date(s) Administered  . Pneumococcal Conjugate-13 12/01/2013  . Td 03/04/1998, 03/01/2017  . Tdap 03/06/2011, 02/28/2017   Pertinent  Health Maintenance Due  Topic Date Due  . OPHTHALMOLOGY EXAM  03/20/2019  . INFLUENZA VACCINE  10/04/2019 (Originally 10/03/2018)  . PNA vac Low Risk Adult (2 of 2 - PPSV23) 03/14/2020 (Originally 12/02/2014)  . HEMOGLOBIN A1C  09/06/2019  . FOOT EXAM  03/14/2020  . COLONOSCOPY  10/18/2025   Fall Risk  03/31/2019 03/15/2019 03/02/2019 01/22/2019 09/24/2018  Falls in the past year? 0 1 1 0 0  Number falls in past yr: 0 0 0 0 0  Injury with  Fall? 0 1 0 0 0   Functional Status Survey:    There were no vitals filed for this visit. There is no height or weight on file to calculate BMI. Physical Exam Constitutional:      General: He is not in acute distress.    Appearance: He is not ill-appearing.  HENT:     Head: Normocephalic.  Eyes:     General: No scleral icterus.       Right eye: No discharge.        Left eye:  No discharge.  Pulmonary:     Effort: Pulmonary effort is normal. No respiratory distress.  Neurological:     Mental Status: He is alert and oriented to person, place, and time.  Psychiatric:        Mood and Affect: Mood normal.        Behavior: Behavior normal.        Thought Content: Thought content normal.        Judgment: Judgment normal.    Labs reviewed: Recent Labs    04/20/18 1414 05/28/18 0807 03/09/19 0832  NA 138 138 137  K 4.1 4.5 4.4  CL 103 103 102  CO2 24 30 29   GLUCOSE 130* 135* 152*  BUN 11 18 21   CREATININE 0.98 1.16 0.94  CALCIUM 9.6 9.2 10.0   Recent Labs    04/20/18 1414 03/09/19 0832  AST 24 27  ALT 35 34  ALKPHOS 48  --   BILITOT 1.1 0.8  PROT 7.2 6.7  ALBUMIN 4.3  --    Recent Labs    04/20/18 1414 03/09/19 0832  WBC 8.4 5.1  NEUTROABS  --  3,259  HGB 16.6 15.7  HCT 49.2 44.9  MCV 87.5 86.8  PLT 134* 117*   Lab Results  Component Value Date   TSH 2.65 10/09/2015   Lab Results  Component Value Date   HGBA1C 9.2 (H) 03/09/2019   Lab Results  Component Value Date   CHOL 166 03/09/2019   HDL 37 (L) 03/09/2019   LDLCALC 103 (H) 03/09/2019   TRIG 159 (H) 03/09/2019   CHOLHDL 4.5 03/09/2019    Significant Diagnostic Results in last 30 days:  No results found.  Assessment/Plan  Acute exacerbation of chronic bronchitis (HCC) Worst after going for a long trip.Afebrile but request Biaxin symptoms tends to worse if not treated from his past exacerbation. - Advised to increase fluid intake/soups - Notify provider if running any fever > 100  degrees. - continue on Albuterol 108 inhaler 2 puffs every 6 hours as needed for wheezing or shortness of breath. - Advised to notify provider if symptoms worsen or fail to resolve or go to the ED for evaluation. Clarithromycin (BIAXIN) 500 MG tablet; Take 1 tablet (500 mg total) by mouth 2 (two) times daily for 7 days.  Dispense: 14 tablet; Refill: 0 education information on AVS mailed to patient.    Family/ staff Communication: Reviewed plan of care with patient  Labs/tests ordered: None   Next Appointment: as needed symptoms worsen or fail to resolve. Spent 15 minutes of face to face video with patient    05/07/2019, NP

## 2019-03-31 NOTE — Patient Instructions (Signed)
- Increase fluid intake/soups - Notify provider if running any fever > 100 degrees. - continue on Albuterol 108 inhaler 2 puffs every 6 hours as needed for wheezing or shortness of breath. - Notify provider if symptoms worsen or fail to resolve or go to the ED for evaluation. - Take Clarithromycin (BIAXIN) 500 MG tablet; Take 1 tablet (500 mg total) by mouth 2 (two) times daily for 7 days.  Dispense: 14 tablet;  Clarithromycin tablets What is this medicine? CLARITHROMYCIN (kla RITH roe mye sin) is a macrolide antibiotic. It is used to treat or prevent certain kinds of bacterial infections. It will not work for colds, flu, or other viral infections. This medicine may be used for other purposes; ask your health care provider or pharmacist if you have questions. COMMON BRAND NAME(S): Biaxin What should I tell my health care provider before I take this medicine? They need to know if you have any of these conditions:  heart disease  history of irregular heartbeat  kidney disease  liver disease  myasthenia gravis  an unusual or allergic reaction to clarithromycin, other macrolide antibiotics, other medicines, foods, dyes, or preservatives  pregnant or trying to get pregnant  breast-feeding How should I use this medicine? Take this medicine by mouth with glass of water. If it upsets your stomach you can take it with milk or food. Follow the directions on the prescription label. Take your medicine at regular intervals. Do not take your medicine more often than directed. Take all of your medicine as directed even if you think your are better. Do not skip doses or stop your medicine early. Talk to your pediatrician regarding the use of this medicine in children. Special care may be needed. Overdosage: If you think you have taken too much of this medicine contact a poison control center or emergency room at once. NOTE: This medicine is only for you. Do not share this medicine with others. What  if I miss a dose? If you miss a dose, take it as soon as you can. If it is almost time for your next dose, take only that dose. Do not take double or extra doses. What may interact with this medicine? Do not take this medicine with any of the following medications:  certain medicines for fungal infections like fluconazole, itraconazole, ketoconazole, posaconazole, voriconazole  cisapride  dronedarone  naloxegol  pimozide  thioridazine This medicine may also interact with the following medications:  birth control pills  carbamazepine  certain medicines for anxiety or sleep like alprazolam, triazolam  certain medicines for cholesterol like atorvastatin, lovastatin, simvastatin  certain medicines for irregular heart beat like amiodarone, disopyramide, flecainide, procainamide, quinidine  certain medicines that treat or prevent clots like warfarin  colchicine  cyclosporine  digoxin  dofetilide  ergot alkaloids like ergotamine, dihydroergotamine  other antibiotics like grepafloxacin, rifabutin, sparfloxacin  other medicines that prolong the QT interval (cause an abnormal heart rhythm)  ritonavir  sildenafil  terfenadine  theophylline  zidovudine  ziprasidone This list may not describe all possible interactions. Give your health care provider a list of all the medicines, herbs, non-prescription drugs, or dietary supplements you use. Also tell them if you smoke, drink alcohol, or use illegal drugs. Some items may interact with your medicine. What should I watch for while using this medicine? Tell your doctor or health care provider if your symptoms do not improve. This medicine may cause serious skin reactions. They can happen weeks to months after starting the medicine. Contact your  health care provider right away if you notice fevers or flu-like symptoms with a rash. The rash may be red or purple and then turn into blisters or peeling of the skin. Or, you might  notice a red rash with swelling of the face, lips or lymph nodes in your neck or under your arms. Do not treat diarrhea with over the counter products. Contact your doctor if you have diarrhea that lasts more than 2 days or if it is severe and watery. If you have diabetes, monitor your blood sugar carefully while on this medicine. What side effects may I notice from receiving this medicine? Side effects that you should report to your doctor or health care professional as soon as possible:  allergic reactions like skin rash, itching or hives, swelling of the face, lips, or tongue  irregular heartbeat or chest pain  pain or difficulty passing urine  rash, fever, and swollen lymph nodes  redness, blistering, peeling or loosening of the skin, including inside the mouth  yellowing of the eyes or skin Side effects that usually do not require medical attention (report to your doctor or health care professional if they continue or are bothersome):  abnormal taste  anxiety, confusion, or nightmares  diarrhea  headache  intestinal gas  stomach upset or nausea This list may not describe all possible side effects. Call your doctor for medical advice about side effects. You may report side effects to FDA at 1-800-FDA-1088. Where should I keep my medicine? Keep out of the reach of children. Store at room temperature between 20 and 25 degrees C (68 and 77 degrees F). Keep container tightly closed. Protect from light. Throw away any unused medicine after the expiration date. NOTE: This sheet is a summary. It may not cover all possible information. If you have questions about this medicine, talk to your doctor, pharmacist, or health care provider.  2020 Elsevier/Gold Standard (2018-05-21 11:40:12)

## 2019-04-02 ENCOUNTER — Other Ambulatory Visit: Payer: Self-pay

## 2019-04-02 ENCOUNTER — Ambulatory Visit: Payer: Medicare Other | Admitting: Family

## 2019-04-08 ENCOUNTER — Other Ambulatory Visit: Payer: Self-pay

## 2019-04-08 ENCOUNTER — Telehealth (INDEPENDENT_AMBULATORY_CARE_PROVIDER_SITE_OTHER): Payer: Medicare Other | Admitting: Family

## 2019-04-08 ENCOUNTER — Ambulatory Visit
Admission: RE | Admit: 2019-04-08 | Discharge: 2019-04-08 | Disposition: A | Discharge status: Home / Self Care | Payer: Medicare Other | Source: Ambulatory Visit | Attending: Family | Admitting: Family

## 2019-04-08 ENCOUNTER — Encounter: Payer: Self-pay | Admitting: Family

## 2019-04-08 VITALS — Temp 98.4°F

## 2019-04-08 DIAGNOSIS — I25118 Atherosclerotic heart disease of native coronary artery with other forms of angina pectoris: Secondary | ICD-10-CM

## 2019-04-08 DIAGNOSIS — R05 Cough: Secondary | ICD-10-CM

## 2019-04-08 DIAGNOSIS — R059 Cough, unspecified: Secondary | ICD-10-CM

## 2019-04-08 NOTE — Progress Notes (Signed)
Patient ID: Kenneth Swift Sr., male   DOB: [birth year], 69 y.o.   MRN: [id number] This service is provided via telemedicine  No vital signs collected/recorded due to the encounter was a telemedicine visit.   Location of patient (ex: home, work):  HOME  Patient consents to a telephone visit:  YES  Location of the provider (ex: office, home):  OFFICE  Name of any referring provider:  TIFFANY REED, DO  Names of all persons participating in the telemedicine service and their role in the encounter:  PATIENT, [name], CMA, Richarda Blade, NP  Time spent on call:  4:06   Provider: Prisha Hiley FNP-C  Kermit Balo, DO  Patient Care Team: Kermit Balo, DO as PCP - General (Geriatric Medicine) [name], MD as Consulting Physician (Cardiology) Van Clines, MD as Consulting Physician (Neurology) [name], [name], OD (Optometry) [name], MD as Consulting Physician (Otolaryngology)  Extended Emergency Contact Information Primary Emergency Contact: Leske,Pam Address: [address]          [place], [place] [address] [place] Phone: [phone] Relation: Spouse Secondary Emergency Contact: Yeagley,Brian  [place] Mobile Phone: [phone] Relation: Son  Code Status:  Full Code  Goals of care: Advanced Directive information Advanced Directives 03/15/2019  Does Patient Have a Medical Advance Directive? No  Does patient want to make changes to medical advance directive? No - Patient declined  Would patient like information on creating a medical advance directive? -  Pre-existing out of facility DNR order (yellow form or pink MOST form) -     Chief Complaint  Patient presents with  . Acute Visit    Not feeling any better, fatigue, no energy    HPI:  Pt is a 69 y.o. male seen today for an acute visit for evaluation of cough and fatigue.He states has taken oral antibiotics Z-pak as previous prescribed has one more dose for today.He  still not feeling any better.He states has no energy.Has productive cough.coughing up small amounts of yellow mucus.He denies any chest pain/tightness,shortness of breath or wheezing.Has albuterol as needed for wheezing/cough. His appetite is described as not good.He had half a biscuit this morning for breakfast.  Has had no loss of smell or taste.Denies any nausea,vomting or diarrhea.He has not been in contact with any person sick with COVID-19 that he can recall.  Reports temp 98.4 today.   Past Medical History:  Diagnosis Date  . Arthritis    "left thumb" (05/23/2017)  . Barrett's esophagus    "we've been told that it's all gone; still take RX for GERD" (05/23/2017)  . CAD (coronary artery disease), native coronary artery    Coronary angiogram 05/03/2014:  Proximal LAD 3.0x12 mm Promus premier stent.  04/08/2014: Mid Cx 3.5 x 16 mm Promus DES.  03/29/2013: Mid LAD 2.75 x 38 mm Promus Premier drug-eluting stent, balloon angioplasty of D1 and distal LAD and stenting of distal RCA with 2.75 x 24 mm promos Premier drug-eluting stent 12/15/2012 widely patent.  . Cervicalgia   . Chronic bronchitis (HCC)    "q yr" (05/23/2017)  . Complication of anesthesia    " I do not wake up very well "  . Family history of adverse reaction to anesthesia    "son w/PONV"  . GERD (gastroesophageal reflux disease)   . Heart murmur    "grew out of it"   . Hyperlipidemia   . Hypoglycemia, unspecified   . IDDM (insulin dependent diabetes mellitus)   .  Impotence of organic origin   . Iron deficiency anemia   . Other malaise and fatigue   . PONV (postoperative nausea and vomiting)   . Restless leg   . Tension headache    "sometimes" (05/23/2017)  . Unspecified gastritis and gastroduodenitis with hemorrhage   . Unstable angina pectoris ([place]) 04/08/2014   Coronary angiogram 05/03/2014:  Proximal LAD 3.0x12 mm Promus premier stent.  04/08/2014: Mid Cx 3.5 x 16 mm Promus DES.  03/29/2013: Mid LAD 2.75 x 38 mm Promus Premier  drug-eluting stent, balloon angioplasty of D1 and distal LAD and stenting of distal RCA with 2.75 x 24 mm promos Premier drug-eluting stent 12/15/2012 widely patent.   Past Surgical History:  Procedure Laterality Date  . CARDIAC CATHETERIZATION N/A 09/25/2015   Procedure: Left Heart Cath and Coronary Angiography;  Surgeon: Adrian Prows, MD;  Location: [facility];  Service: Cardiovascular;  Laterality: N/A;  . CARDIAC CATHETERIZATION N/A 09/25/2015   Procedure: Intravascular Pressure Wire/FFR Study;  Surgeon: Adrian Prows, MD;  Location: [facility];  Service: Cardiovascular;  Laterality: N/A;  . CORONARY ANGIOPLASTY WITH STENT PLACEMENT  12/15/2012; 04/28/2014; 05/03/2014   "2; 1; 1"   . FRACTIONAL FLOW RESERVE WIRE Right 03/26/2013   Procedure: FRACTIONAL FLOW RESERVE WIRE;  Surgeon: Laverda Page, MD;  Location[facility] C[facility] LAB;  Service: Cardiovascular;  Laterality: Right;  . FRACTIONAL FLOW RESERVE WIRE N/A 05/03/2014   Procedure: FRACTIONAL FLOW RESERVE WIRE;  Surgeon: Laverda Page, MD;  Location: [facility] CATH LAB;  Service: Cardiovascular;  Laterality: N/A;  . HERNIA REPAIR  7412   "umbilical"   . INCISION AND DRAINAGE ABSCESS Left 05/2007   "groin"   . KNEE SURGERY Right 1992;  2000   "calcium deposits removed" (12/15/2012)  . LEFT HEART CATH AND CORONARY ANGIOGRAPHY N/A 05/23/2017   Procedure: LEFT HEART CATH AND CORONARY ANGIOGRAPHY;  Surgeon: Adrian Prows, MD;  Location: [facility];  Service: Cardiovascular;  Laterality: N/A;  . LEFT HEART CATHETERIZATION WITH CORONARY ANGIOGRAM N/A 12/15/2012   Procedure: LEFT HEART CATHETERIZATION WITH CORONARY ANGIOGRAM;  Surgeon: Laverda Page, MD;  Location: [facility] CATH LAB;  Service: Cardiovascular;  Laterality: N/A;  . LEFT HEART CATHETERIZATION WITH CORONARY ANGIOGRAM N/A 03/26/2013   Procedure: LEFT HEART CATHETERIZATION WITH CORONARY ANGIOGRAM;  Surgeon: Laverda Page, MD;  Location: [facility] CATH LAB;  Service: Cardiovascular;   Laterality: N/A;  . LEFT HEART CATHETERIZATION WITH CORONARY ANGIOGRAM N/A 04/08/2014   Procedure: LEFT HEART CATHETERIZATION WITH CORONARY ANGIOGRAM;  Surgeon: Blane Ohara, MD;  Location: [facility] CATH LAB;  Service: Cardiovascular;  Laterality: N/A;  . PERCUTANEOUS CORONARY STENT INTERVENTION (PCI-S) N/A 05/03/2014   Procedure: PERCUTANEOUS CORONARY STENT INTERVENTION (PCI-S);  Surgeon: Laverda Page, MD;  Location: [facility] CATH LAB;  Service: Cardiovascular;  Laterality: N/A;  . UMBILICAL HERNIA REPAIR      Allergies  Allergen Reactions  . Lyrica [Pregabalin] Other (See Comments)    Made patient very lethargic the next morning, very hard to patient to function, move, etc.   . Statins Other (See Comments)    Causes Restless Legs, rhabdomyolysis  . Metformin And Related     GI upset  . Trulicity [Dulaglutide] Other (See Comments)    GI side effects     Outpatient Encounter Medications as of 04/08/2019  Medication Sig  . acetaminophen (TYLENOL) 500 MG tablet Take 1,000 mg by mouth every 6 (six) hours as needed for headache.   . albuterol (PROVENTIL HFA;VENTOLIN HFA)  108 (90 Base) MCG/ACT inhaler Inhale 2 puffs into the lungs every 6 (six) hours as needed for wheezing or shortness of breath.  . Alirocumab (PRALUENT) 75 MG/ML SOAJ Inject 75 mg into the skin every 14 (fourteen) days.  Marland Kitchen aspirin 81 MG EC tablet Take 1 tablet (81 mg total) by mouth daily.  . BD PEN NEEDLE NANO U/F 32G X 4 MM MISC USE TWICE DAILY WITH LANTUS PEN  . carvedilol (COREG) 6.25 MG tablet TAKE 1/2 TABLET BY MOUTH TWICE DAILY WITH MEALS  . [employer] VITAMIN C 500 MG tablet TAKE 1 TABLET BY MOUTH TWICE DAILY  . D-5000 125 MCG (5000 UT) TABS TAKE 1 TABLET BY MOUTH EVERY DAY  . fluticasone (FLONASE) 50 MCG/ACT nasal spray Place 1 spray into both nostrils daily as needed for allergies or rhinitis.  Marland Kitchen gabapentin (NEURONTIN) 100 MG capsule Take 200 mg by mouth daily after supper.  . Insulin Glargine (BASAGLAR KWIKPEN) 100 UNIT/ML SOPN  INJECT 40 UNITS SUBCUTANEOUSLY INTO THE SKIN AT BEDTIME  . LIFESCAN FINEPOINT LANCETS MISC Use to test for blood sugar three times daily dx: E11.51  . metFORMIN (GLUCOPHAGE) 500 MG tablet Take 1 tablet (500 mg total) by mouth 2 (two) times daily with a meal.  . nitroGLYCERIN (NITROSTAT) 0.4 MG SL tablet PLACE 1 TABLET (0.4 MG TOTAL) UNDER THE TONGUE EVERY 5 (FIVE) MINUTES AS NEEDED FOR CHEST PAIN.  Marland Kitchen ONETOUCH ULTRA test strip USE TO TEST FOR BLOOD SUGAR THREE TIMES DAILY DX: E11.51  . pantoprazole (PROTONIX) 40 MG tablet Take 40 mg by mouth daily.  . pramipexole (MIRAPEX) 1 MG tablet TAKE 2 TABLETS (2 MG TOTAL) BY MOUTH AT BEDTIME. DX G25.81  . valsartan (DIOVAN) 80 MG tablet Take 1 tablet (80 mg total) by mouth daily.   No facility-administered encounter medications on file as of 04/08/2019.    Review of Systems  Constitutional: Positive for fatigue. Negative for chills and fever.       Describes appetite as not good had half biscuit this morning    HENT: Negative for congestion, rhinorrhea, sinus pressure, sinus pain, sneezing and sore throat.   Eyes: Negative for discharge, redness and itching.  Respiratory: Negative for cough, chest tightness, shortness of breath and wheezing.   Cardiovascular: Negative for chest pain, palpitations and leg swelling.  Gastrointestinal: Negative for abdominal distention, abdominal pain, diarrhea, nausea and vomiting.  Skin: Negative for color change, pallor and rash.  Neurological: Negative for dizziness, speech difficulty, light-headedness and headaches.       Reports generalized weakness   Psychiatric/Behavioral: Negative for agitation, confusion and sleep disturbance. The patient is not nervous/anxious.     Immunization History  Administered Date(s) Administered  . Pneumococcal Conjugate-13 12/01/2013  . Td 03/04/1998, 03/01/2017  . Tdap 03/06/2011, 02/28/2017   Pertinent  Health Maintenance Due  Topic Date Due  . OPHTHALMOLOGY EXAM  03/20/2019   . INFLUENZA VACCINE  10/04/2019 (Originally 10/03/2018)  . PNA vac Low Risk Adult (2 of 2 - PPSV23) 03/14/2020 (Originally 12/02/2014)  . HEMOGLOBIN A1C  09/06/2019  . FOOT EXAM  03/14/2020  . COLONOSCOPY  10/18/2025   Fall Risk  04/08/2019 03/31/2019 03/15/2019 03/02/2019 01/22/2019  Falls in the past year? 0 0 1 1 0  Number falls in past yr: 0 0 0 0 0  Injury with Fall? 0 0 1 0 0      Vitals:   04/08/19 1006  Temp: 98.4 F (36.9 C)   There is no height or weight on  file to calculate BMI. Physical Exam Constitutional:      General: He is not in acute distress.    Appearance: He is not ill-appearing.  HENT:     Head: Normocephalic.     Mouth/Throat:     Mouth: Mucous membranes are moist.  Pulmonary:     Effort: Pulmonary effort is normal.  Musculoskeletal:     Right lower leg: No edema.     Left lower leg: No edema.  Skin:    Coloration: Skin is not pale.     Findings: No bruising or erythema.  Neurological:     Mental Status: He is alert and oriented to person, place, and time.  Psychiatric:        Mood and Affect: Mood normal.        Behavior: Behavior normal.        Thought Content: Thought content normal.        Judgment: Judgment normal.     Labs reviewed: Recent Labs    04/20/18 1414 05/28/18 0807 03/09/19 0832  NA 138 138 137  K 4.1 4.5 4.4  CL 103 103 102  CO2 24 30 29   GLUCOSE 130* 135* 152*  BUN 11 18 21   CREATININE 0.98 1.16 0.94  CALCIUM 9.6 9.2 10.0   Recent Labs    04/20/18 1414 03/09/19 0832  AST 24 27  ALT 35 34  ALKPHOS 48  --   BILITOT 1.1 0.8  PROT 7.2 6.7  ALBUMIN 4.3  --    Recent Labs    04/20/18 1414 03/09/19 0832  WBC 8.4 5.1  NEUTROABS  --  3,259  HGB 16.6 15.7  HCT 49.2 44.9  MCV 87.5 86.8  PLT 134* 117*   Lab Results  Component Value Date   TSH 2.65 10/09/2015   Lab Results  Component Value Date   HGBA1C 9.2 (H) 03/09/2019   Lab Results  Component Value Date   CHOL 166 03/09/2019   HDL 37 (L) 03/09/2019    LDLCALC 103 (H) 03/09/2019   TRIG 159 (H) 03/09/2019   CHOLHDL 4.5 03/09/2019    Significant Diagnostic Results in last 30 days:  No results found.  Assessment/Plan 1. Cough in adult Afebrile. - Advised to complete current oral antibiotic Z-pak. - increase fluid intake  - Notify provider if running any fever > 100.0 or symptoms worsen or fail to resolve. Will obtain imaging to rule out other acute abnormalities.Advised to get chest X-ray done at [facility] [place] imaging.He is familiar with the imaging place. Will call with results.   - DG Chest 2 View; Future  2. Generalized weakness  suspect due to disease process and his poor oral intake. - Increase Fluid intake - Get rest  - complete oral antibiotics as prescribed - Get chest X-ray done as above.   Family/ staff Communication: Reviewed plan of care with patient  Labs/tests ordered: - DG Chest 2 View; Future  Next Appointment: as needed   Spent 15 minutes of non-face to face with patient   [facility], NP

## 2019-04-08 NOTE — Patient Instructions (Addendum)
-  complete current oral antibiotic - increase fluid intake and get plenty of rest  - Notify provider or go to ED  if running any fever > 100.0 or symptoms worsen or fail to resolve. - Please  get chest X-ray done at [facility] [place] imaging. Will call with results.

## 2019-04-09 ENCOUNTER — Telehealth: Payer: Self-pay | Admitting: *Deleted

## 2019-04-09 DIAGNOSIS — R05 Cough: Secondary | ICD-10-CM | POA: Diagnosis not present

## 2019-04-09 DIAGNOSIS — R197 Diarrhea, unspecified: Secondary | ICD-10-CM | POA: Diagnosis not present

## 2019-04-09 DIAGNOSIS — U071 COVID-19: Secondary | ICD-10-CM | POA: Diagnosis not present

## 2019-04-09 DIAGNOSIS — Z20828 Contact with and (suspected) exposure to other viral communicable diseases: Secondary | ICD-10-CM | POA: Diagnosis not present

## 2019-04-09 NOTE — Telephone Encounter (Signed)
Patient notified

## 2019-04-09 NOTE — Telephone Encounter (Signed)
Patient called and stated that he spoke with you yesterday. Stated that he is out of his antibiotics and still no better. Please Advise.

## 2019-04-09 NOTE — Telephone Encounter (Signed)
Chest X-ray showed no abnormalities.May need to get tested for COVID-19 or go to ED for further evaluation.

## 2019-04-10 ENCOUNTER — Telehealth: Payer: Self-pay | Admitting: Unknown Physician Specialty

## 2019-04-10 ENCOUNTER — Other Ambulatory Visit: Payer: Self-pay | Admitting: Unknown Physician Specialty

## 2019-04-10 DIAGNOSIS — I10 Essential (primary) hypertension: Secondary | ICD-10-CM

## 2019-04-10 DIAGNOSIS — E1151 Type 2 diabetes mellitus with diabetic peripheral angiopathy without gangrene: Secondary | ICD-10-CM

## 2019-04-10 DIAGNOSIS — U071 COVID-19: Secondary | ICD-10-CM

## 2019-04-10 NOTE — Telephone Encounter (Signed)
  I connected by phone with Kenneth Sic Sr. on 04/10/2019 at 9:28 AM to discuss the potential use of an new treatment for mild to moderate COVID-19 viral infection in non-hospitalized patients.  This patient is a 69 y.o. male that meets the FDA criteria for Emergency Use Authorization of bamlanivimab or casirivimab\imdevimab.  Has a (+) direct SARS-CoV-2 viral test result  Has mild or moderate COVID-19   Is ? 70 years of age and weighs ? 40 kg  Is NOT hospitalized due to COVID-19  Is NOT requiring oxygen therapy or requiring an increase in baseline oxygen flow rate due to COVID-19  Is within 10 days of symptom onset  Has at least one of the high risk factor(s) for progression to severe COVID-19 and/or hospitalization as defined in EUA.  Specific high risk criteria : >/= 69 yo   I have spoken and communicated the following to the patient or parent/caregiver:  1. FDA has authorized the emergency use of bamlanivimab and casirivimab\imdevimab for the treatment of mild to moderate COVID-19 in adults and pediatric patients with positive results of direct SARS-CoV-2 viral testing who are 46 years of age and older weighing at least 40 kg, and who are at high risk for progressing to severe COVID-19 and/or hospitalization.  2. The significant known and potential risks and benefits of bamlanivimab and casirivimab\imdevimab, and the extent to which such potential risks and benefits are unknown.  3. Information on available alternative treatments and the risks and benefits of those alternatives, including clinical trials.  4. Patients treated with bamlanivimab and casirivimab\imdevimab should continue to self-isolate and use infection control measures (e.g., wear mask, isolate, social distance, avoid sharing personal items, clean and disinfect "high touch" surfaces, and frequent handwashing) according to CDC guidelines.   5. The patient or parent/caregiver has the option to accept or refuse  bamlanivimab or casirivimab\imdevimab .  After reviewing this information with the patient, The patient agreed to proceed with receiving the casirivimab\imdevimab infusion and will be provided a copy of the Fact sheet prior to receiving the infusion.Gabriel Cirri 04/10/2019 9:28 AM He is on day 10 with symptom onset 12/27

## 2019-04-11 ENCOUNTER — Ambulatory Visit (HOSPITAL_COMMUNITY)
Admission: RE | Admit: 2019-04-11 | Discharge: 2019-04-11 | Disposition: A | Discharge status: Home / Self Care | Payer: Medicare Other | Source: Ambulatory Visit | Attending: Pulmonary Disease | Admitting: Pulmonary Disease

## 2019-04-11 DIAGNOSIS — E1151 Type 2 diabetes mellitus with diabetic peripheral angiopathy without gangrene: Secondary | ICD-10-CM | POA: Diagnosis not present

## 2019-04-11 DIAGNOSIS — I10 Essential (primary) hypertension: Secondary | ICD-10-CM | POA: Diagnosis not present

## 2019-04-11 DIAGNOSIS — Z20822 Contact with and (suspected) exposure to covid-19: Secondary | ICD-10-CM | POA: Diagnosis not present

## 2019-04-11 DIAGNOSIS — Z7982 Long term (current) use of aspirin: Secondary | ICD-10-CM | POA: Diagnosis not present

## 2019-04-11 DIAGNOSIS — K2270 Barrett's esophagus without dysplasia: Secondary | ICD-10-CM | POA: Diagnosis present

## 2019-04-11 DIAGNOSIS — R0789 Other chest pain: Secondary | ICD-10-CM | POA: Diagnosis not present

## 2019-04-11 DIAGNOSIS — E876 Hypokalemia: Secondary | ICD-10-CM | POA: Diagnosis not present

## 2019-04-11 DIAGNOSIS — Z79899 Other long term (current) drug therapy: Secondary | ICD-10-CM | POA: Diagnosis not present

## 2019-04-11 DIAGNOSIS — U071 COVID-19: Secondary | ICD-10-CM

## 2019-04-11 DIAGNOSIS — J1282 Pneumonia due to coronavirus disease 2019: Secondary | ICD-10-CM | POA: Diagnosis not present

## 2019-04-11 DIAGNOSIS — G2581 Restless legs syndrome: Secondary | ICD-10-CM | POA: Diagnosis not present

## 2019-04-11 DIAGNOSIS — E1165 Type 2 diabetes mellitus with hyperglycemia: Secondary | ICD-10-CM | POA: Diagnosis not present

## 2019-04-11 DIAGNOSIS — Z955 Presence of coronary angioplasty implant and graft: Secondary | ICD-10-CM | POA: Diagnosis not present

## 2019-04-11 DIAGNOSIS — R110 Nausea: Secondary | ICD-10-CM | POA: Diagnosis not present

## 2019-04-11 DIAGNOSIS — Z794 Long term (current) use of insulin: Secondary | ICD-10-CM | POA: Diagnosis not present

## 2019-04-11 DIAGNOSIS — M1812 Unilateral primary osteoarthritis of first carpometacarpal joint, left hand: Secondary | ICD-10-CM | POA: Diagnosis present

## 2019-04-11 DIAGNOSIS — R0602 Shortness of breath: Secondary | ICD-10-CM | POA: Diagnosis not present

## 2019-04-11 DIAGNOSIS — R05 Cough: Secondary | ICD-10-CM | POA: Diagnosis not present

## 2019-04-11 DIAGNOSIS — I25118 Atherosclerotic heart disease of native coronary artery with other forms of angina pectoris: Secondary | ICD-10-CM | POA: Diagnosis not present

## 2019-04-11 DIAGNOSIS — Z23 Encounter for immunization: Secondary | ICD-10-CM | POA: Diagnosis not present

## 2019-04-11 DIAGNOSIS — R197 Diarrhea, unspecified: Secondary | ICD-10-CM | POA: Diagnosis not present

## 2019-04-11 DIAGNOSIS — J9601 Acute respiratory failure with hypoxia: Secondary | ICD-10-CM | POA: Diagnosis not present

## 2019-04-11 DIAGNOSIS — K219 Gastro-esophageal reflux disease without esophagitis: Secondary | ICD-10-CM | POA: Diagnosis not present

## 2019-04-11 DIAGNOSIS — I2510 Atherosclerotic heart disease of native coronary artery without angina pectoris: Secondary | ICD-10-CM | POA: Diagnosis present

## 2019-04-11 DIAGNOSIS — E785 Hyperlipidemia, unspecified: Secondary | ICD-10-CM | POA: Diagnosis present

## 2019-04-11 MED ORDER — DIPHENHYDRAMINE HCL 50 MG/ML IJ SOLN: 50.0000 mg | Freq: Once | INTRAMUSCULAR | Status: DC | PRN

## 2019-04-11 MED ORDER — SODIUM CHLORIDE 0.9 % IV SOLN
Freq: Once | INTRAVENOUS | Status: AC
  Filled 2019-04-11: qty 10

## 2019-04-11 MED ORDER — ALBUTEROL SULFATE HFA 108 (90 BASE) MCG/ACT IN AERS: 2.0000 | INHALATION_SPRAY | Freq: Once | RESPIRATORY_TRACT | Status: DC | PRN

## 2019-04-11 MED ORDER — EPINEPHRINE 0.3 MG/0.3ML IJ SOAJ: 0.3000 mg | Freq: Once | INTRAMUSCULAR | Status: DC | PRN

## 2019-04-11 MED ORDER — SODIUM CHLORIDE 0.9 % IV SOLN: INTRAVENOUS | Status: DC | PRN

## 2019-04-11 MED ORDER — METHYLPREDNISOLONE SODIUM SUCC 125 MG IJ SOLR: 125.0000 mg | Freq: Once | INTRAMUSCULAR | Status: DC | PRN

## 2019-04-11 MED ORDER — FAMOTIDINE IN NACL 20-0.9 MG/50ML-% IV SOLN: 20.0000 mg | Freq: Once | INTRAVENOUS | Status: DC | PRN

## 2019-04-11 NOTE — Discharge Instructions (Signed)

## 2019-04-11 NOTE — Progress Notes (Signed)
  Diagnosis: COVID-19  Physician: Dr. Wright  Procedure: [facility] Med: casirivimab\imdevimab infusion - Provided patient with casirivimab\imdevimab fact sheet for patients, parents and caregivers prior to infusion.  Complications: No immediate complications noted.  Discharge: Discharged home   [name] 04/11/2019   

## 2019-04-12 ENCOUNTER — Encounter: Payer: Self-pay | Admitting: Family

## 2019-04-12 ENCOUNTER — Other Ambulatory Visit: Payer: Self-pay

## 2019-04-12 ENCOUNTER — Encounter (HOSPITAL_COMMUNITY): Payer: Self-pay | Admitting: Internal Medicine

## 2019-04-12 ENCOUNTER — Ambulatory Visit (INDEPENDENT_AMBULATORY_CARE_PROVIDER_SITE_OTHER): Payer: Medicare Other | Admitting: Family

## 2019-04-12 ENCOUNTER — Telehealth: Payer: Self-pay | Admitting: *Deleted

## 2019-04-12 DIAGNOSIS — Z20822 Contact with and (suspected) exposure to covid-19: Secondary | ICD-10-CM

## 2019-04-12 DIAGNOSIS — R05 Cough: Secondary | ICD-10-CM

## 2019-04-12 DIAGNOSIS — R058 Other specified cough: Secondary | ICD-10-CM

## 2019-04-12 DIAGNOSIS — R110 Nausea: Secondary | ICD-10-CM

## 2019-04-12 DIAGNOSIS — R197 Diarrhea, unspecified: Secondary | ICD-10-CM

## 2019-04-12 MED ORDER — ONDANSETRON HCL 4 MG PO TABS: 4.0000 mg | ORAL_TABLET | Freq: Three times a day (TID) | ORAL | 0 refills | Status: DC | PRN

## 2019-04-12 NOTE — Patient Instructions (Addendum)
- continue on vitamin C and vit D supplement  - Take ondansetron (ZOFRAN) 4 MG tablet; Take 1 tablet (4 mg total) by mouth every 8 (eight) hours as needed for nausea or vomiting.  Dispense: 20 tablet; Refill: 0 - increase fluid intake or soups to stay hydrated. - Notify provider or call [id number] if symptoms worsen or fail to improve.Instructed to go to ED if he develops any chest tightness,chest pain,shortness of breath or wheezing.  - Practice self Quarantine,social distance,wear facial mask and practice hand hygiene  Per CDC guidelines.  COVID-19 COVID-19 is a respiratory infection that is caused by a virus called severe acute respiratory syndrome coronavirus 2 (SARS-CoV-2). The disease is also known as coronavirus disease or novel coronavirus. In some people, the virus may not cause any symptoms. In others, it may cause a serious infection. The infection can get worse quickly and can lead to complications, such as:  Pneumonia, or infection of the lungs.  Acute respiratory distress syndrome or ARDS. This is a condition in which fluid build-up in the lungs prevents the lungs from filling with air and passing oxygen into the blood.  Acute respiratory failure. This is a condition in which there is not enough oxygen passing from the lungs to the body or when carbon dioxide is not passing from the lungs out of the body.  Sepsis or septic shock. This is a serious bodily reaction to an infection.  Blood clotting problems.  Secondary infections due to bacteria or fungus.  Organ failure. This is when your body's organs stop working. The virus that causes COVID-19 is contagious. This means that it can spread from person to person through droplets from coughs and sneezes (respiratory secretions). What are the causes? This illness is caused by a virus. You may catch the virus by:  Breathing in droplets from an infected person. Droplets can be spread by a person breathing, speaking, singing, coughing, or  sneezing.  Touching something, like a table or a doorknob, that was exposed to the virus (contaminated) and then touching your mouth, nose, or eyes. What increases the risk? Risk for infection You are more likely to be infected with this virus if you:  Are within 6 feet (2 meters) of a person with COVID-19.  Provide care for or live with a person who is infected with COVID-19.  Spend time in crowded indoor spaces or live in shared housing. Risk for serious illness You are more likely to become seriously ill from the virus if you:  Are 67 years of age or older. The higher your age, the more you are at risk for serious illness.  Live in a nursing home or long-term care facility.  Have cancer.  Have a long-term (chronic) disease such as: ? Chronic lung disease, including chronic obstructive pulmonary disease or asthma. ? A long-term disease that lowers your body's ability to fight infection (immunocompromised). ? Heart disease, including heart failure, a condition in which the arteries that lead to the heart become narrow or blocked (coronary artery disease), a disease which makes the heart muscle thick, weak, or stiff (cardiomyopathy). ? Diabetes. ? Chronic kidney disease. ? Sickle cell disease, a condition in which red blood cells have an abnormal "sickle" shape. ? Liver disease.  Are obese. What are the signs or symptoms? Symptoms of this condition can range from mild to severe. Symptoms may appear any time from 2 to 14 days after being exposed to the virus. They include:  A fever or chills.  A cough.  Difficulty breathing.  Headaches, body aches, or muscle aches.  Runny or stuffy (congested) nose.  A sore throat.  New loss of taste or smell. Some people may also have stomach problems, such as nausea, vomiting, or diarrhea. Other people may not have any symptoms of COVID-19. How is this diagnosed? This condition may be diagnosed based on:  Your signs and symptoms,  especially if: ? You live in an area with a COVID-19 outbreak. ? You recently traveled to or from an area where the virus is common. ? You provide care for or live with a person who was diagnosed with COVID-19. ? You were exposed to a person who was diagnosed with COVID-19.  A physical exam.  Lab tests, which may include: ? Taking a sample of fluid from the back of your nose and throat (nasopharyngeal fluid), your nose, or your throat using a swab. ? A sample of mucus from your lungs (sputum). ? Blood tests.  Imaging tests, which may include, X-rays, CT scan, or ultrasound. How is this treated? At present, there is no medicine to treat COVID-19. Medicines that treat other diseases are being used on a trial basis to see if they are effective against COVID-19. Your health care provider will talk with you about ways to treat your symptoms. For most people, the infection is mild and can be managed at home with rest, fluids, and over-the-counter medicines. Treatment for a serious infection usually takes places in a hospital intensive care unit (ICU). It may include one or more of the following treatments. These treatments are given until your symptoms improve.  Receiving fluids and medicines through an IV.  Supplemental oxygen. Extra oxygen is given through a tube in the nose, a face mask, or a hood.  Positioning you to lie on your stomach (prone position). This makes it easier for oxygen to get into the lungs.  Continuous positive airway pressure (CPAP) or bi-level positive airway pressure (BPAP) machine. This treatment uses mild air pressure to keep the airways open. A tube that is connected to a motor delivers oxygen to the body.  Ventilator. This treatment moves air into and out of the lungs by using a tube that is placed in your windpipe.  Tracheostomy. This is a procedure to create a hole in the neck so that a breathing tube can be inserted.  Extracorporeal membrane oxygenation  (ECMO). This procedure gives the lungs a chance to recover by taking over the functions of the heart and lungs. It supplies oxygen to the body and removes carbon dioxide. Follow these instructions at home: Lifestyle  If you are sick, stay home except to get medical care. Your health care provider will tell you how long to stay home. Call your health care provider before you go for medical care.  Rest at home as told by your health care provider.  Do not use any products that contain nicotine or tobacco, such as cigarettes, e-cigarettes, and chewing tobacco. If you need help quitting, ask your health care provider.  Return to your normal activities as told by your health care provider. Ask your health care provider what activities are safe for you. General instructions  Take over-the-counter and prescription medicines only as told by your health care provider.  Drink enough fluid to keep your urine pale yellow.  Keep all follow-up visits as told by your health care provider. This is important. How is this prevented?  There is no vaccine to help prevent COVID-19 infection. However,  there are steps you can take to protect yourself and others from this virus. To protect yourself:   Do not travel to areas where COVID-19 is a risk. The areas where COVID-19 is reported change often. To identify high-risk areas and travel restrictions, check the CDC travel website: FatFares.com.br  If you live in, or must travel to, an area where COVID-19 is a risk, take precautions to avoid infection. ? Stay away from people who are sick. ? Wash your hands often with soap and water for 20 seconds. If soap and water are not available, use an alcohol-based hand sanitizer. ? Avoid touching your mouth, face, eyes, or nose. ? Avoid going out in public, follow guidance from your state and local health authorities. ? If you must go out in public, wear a cloth face covering or face mask. Make sure your  mask covers your nose and mouth. ? Avoid crowded indoor spaces. Stay at least 6 feet (2 meters) away from others. ? Disinfect objects and surfaces that are frequently touched every day. This may include:  Counters and tables.  Doorknobs and light switches.  Sinks and faucets.  Electronics, such as phones, remote controls, keyboards, computers, and tablets. To protect others: If you have symptoms of COVID-19, take steps to prevent the virus from spreading to others.  If you think you have a COVID-19 infection, contact your health care provider right away. Tell your health care team that you think you may have a COVID-19 infection.  Stay home. Leave your house only to seek medical care. Do not use public transport.  Do not travel while you are sick.  Wash your hands often with soap and water for 20 seconds. If soap and water are not available, use alcohol-based hand sanitizer.  Stay away from other members of your household. Let healthy household members care for children and pets, if possible. If you have to care for children or pets, wash your hands often and wear a mask. If possible, stay in your own room, separate from others. Use a different bathroom.  Make sure that all people in your household wash their hands well and often.  Cough or sneeze into a tissue or your sleeve or elbow. Do not cough or sneeze into your hand or into the air.  Wear a cloth face covering or face mask. Make sure your mask covers your nose and mouth. Where to find more information  Centers for Disease Control and Prevention: PurpleGadgets.be  World Health Organization: https://www.castaneda.info/ Contact a health care provider if:  You live in or have traveled to an area where COVID-19 is a risk and you have symptoms of the infection.  You have had contact with someone who has COVID-19 and you have symptoms of the infection. Get help right away if:  You have  trouble breathing.  You have pain or pressure in your chest.  You have confusion.  You have bluish lips and fingernails.  You have difficulty waking from sleep.  You have symptoms that get worse. These symptoms may represent a serious problem that is an emergency. Do not wait to see if the symptoms will go away. Get medical help right away. Call your local emergency services (911 in the [place] Do not drive yourself to the hospital. Let the emergency medical personnel know if you think you have COVID-19. Summary  COVID-19 is a respiratory infection that is caused by a virus. It is also known as coronavirus disease or novel coronavirus. It can cause serious  infections, such as pneumonia, acute respiratory distress syndrome, acute respiratory failure, or sepsis.  The virus that causes COVID-19 is contagious. This means that it can spread from person to person through droplets from breathing, speaking, singing, coughing, or sneezing.  You are more likely to develop a serious illness if you are 73 years of age or older, have a weak immune system, live in a nursing home, or have chronic disease.  There is no medicine to treat COVID-19. Your health care provider will talk with you about ways to treat your symptoms.  Take steps to protect yourself and others from infection. Wash your hands often and disinfect objects and surfaces that are frequently touched every day. Stay away from people who are sick and wear a mask if you are sick. This information is not intended to replace advice given to you by your health care provider. Make sure you discuss any questions you have with your health care provider. Document Revised: 12/18/2018 Document Reviewed: 03/26/2018 Elsevier Patient Education  2020 [employer].

## 2019-04-12 NOTE — Progress Notes (Signed)
Patient ID: Kenneth Twilley Sr., male   DOB: 18-Jul-1950, 69 y.o.   MRN: [id number] This service is provided via telemedicine  No vital signs collected/recorded due to the encounter was a telemedicine visit.   Location of patient (ex: home, work):  HOME  Patient consents to a telephone visit:  YES  Location of the provider (ex: office, home):  OFFICE  Name of any referring provider:  TIFFANY REED, DO  Names of all persons participating in the telemedicine service and their role in the encounter:  PATIENT, [name], CMA, Richarda Blade, NP  Time spent on call:  4:03    Provider: Verneal Wiers FNP-C  Kermit Balo, DO  Patient Care Team: Kermit Balo, DO as PCP - General (Geriatric Medicine) [name], MD as Consulting Physician (Cardiology) Van Clines, MD as Consulting Physician (Neurology) [name], [name], OD (Optometry) [name], MD as Consulting Physician (Otolaryngology)  Extended Emergency Contact Information Primary Emergency Contact: Horton,Pam Address: [address]          [place], [place] [address] [place] Phone: [phone] Relation: Spouse Secondary Emergency Contact: Ridling,Brian  [place] Mobile Phone: [phone] Relation: Son  Code Status: full Code  Goals of care: Advanced Directive information Advanced Directives 03/15/2019  Does Patient Have a Medical Advance Directive? No  Does patient want to make changes to medical advance directive? No - Patient declined  Would patient like information on creating a medical advance directive? -  Pre-existing out of facility DNR order (yellow form or pink MOST form) -     Chief Complaint  Patient presents with  . Acute Visit    Not feeling well covid positive    HPI:  Pt is a 69 y.o. male seen today for an acute visit for evaluation COVID-19 positive.He states not feeling.He was seen in an urgent care was diagnosed with COVID-19.He states was treated 04/11/2019  with COVID-19 antibody infusion.He states still coughing,hoarse voice and has nausea and diarrhea.He states appetite is bad but was able to eat  a  lillte today.had a boiled egg too.He was prescribed short course of Zinc tablet which he states has completed.He had Vit D 3 but has run out of the tablet.  He denies any fever or chills temp 98.1 today.He has had no chest tightness,chest pain, shortness of breath or wheezing.No loss of taste or smell.    Past Medical History:  Diagnosis Date  . Arthritis    "left thumb" (05/23/2017)  . Barrett's esophagus    "we've been told that it's all gone; still take RX for GERD" (05/23/2017)  . CAD (coronary artery disease), native coronary artery    Coronary angiogram 05/03/2014:  Proximal LAD 3.0x12 mm Promus premier stent.  04/08/2014: Mid Cx 3.5 x 16 mm Promus DES.  03/29/2013: Mid LAD 2.75 x 38 mm Promus Premier drug-eluting stent, balloon angioplasty of D1 and distal LAD and stenting of distal RCA with 2.75 x 24 mm promos Premier drug-eluting stent 12/15/2012 widely patent.  . Cervicalgia   . Chronic bronchitis (HCC)    "q yr" (05/23/2017)  . Complication of anesthesia    " I do not wake up very well "  . Family history of adverse reaction to anesthesia    "son w/PONV"  . GERD (gastroesophageal reflux disease)   . Heart murmur    "grew out of it"   . Hyperlipidemia   . Hypoglycemia, unspecified   . IDDM (insulin dependent diabetes mellitus)   .  Impotence of organic origin   . Iron deficiency anemia   . Other malaise and fatigue   . PONV (postoperative nausea and vomiting)   . Restless leg   . Tension headache    "sometimes" (05/23/2017)  . Unspecified gastritis and gastroduodenitis with hemorrhage   . Unstable angina pectoris ([place]) 04/08/2014   Coronary angiogram 05/03/2014:  Proximal LAD 3.0x12 mm Promus premier stent.  04/08/2014: Mid Cx 3.5 x 16 mm Promus DES.  03/29/2013: Mid LAD 2.75 x 38 mm Promus Premier drug-eluting stent, balloon angioplasty of D1  and distal LAD and stenting of distal RCA with 2.75 x 24 mm promos Premier drug-eluting stent 12/15/2012 widely patent.   Past Surgical History:  Procedure Laterality Date  . CARDIAC CATHETERIZATION N/A 09/25/2015   Procedure: Left Heart Cath and Coronary Angiography;  Surgeon: Adrian Prows, MD;  Location: [place] [facility];  Service: Cardiovascular;  Laterality: N/A;  . CARDIAC CATHETERIZATION N/A 09/25/2015   Procedure: Intravascular Pressure Wire/FFR Study;  Surgeon: Adrian Prows, MD;  Location: [place] [facility];  Service: Cardiovascular;  Laterality: N/A;  . CORONARY ANGIOPLASTY WITH STENT PLACEMENT  12/15/2012; 04/28/2014; 05/03/2014   "2; 1; 1"   . FRACTIONAL FLOW RESERVE WIRE Right 03/26/2013   Procedure: FRACTIONAL FLOW RESERVE WIRE;  Surgeon: Laverda Page, MD;  Location: [facility] C[facility] LAB;  Service: Cardiovascular;  Laterality: Right;  . FRACTIONAL FLOW RESERVE WIRE N/A 05/03/2014   Procedure: FRACTIONAL FLOW RESERVE WIRE;  Surgeon: Laverda Page, MD;  Location: [facility] CATH LAB;  Service: Cardiovascular;  Laterality: N/A;  . HERNIA REPAIR  9924   "umbilical"   . INCISION AND DRAINAGE ABSCESS Left 05/2007   "groin"   . KNEE SURGERY Right 1992;  2000   "calcium deposits removed" (12/15/2012)  . LEFT HEART CATH AND CORONARY ANGIOGRAPHY N/A 05/23/2017   Procedure: LEFT HEART CATH AND CORONARY ANGIOGRAPHY;  Surgeon: Adrian Prows, MD;  Location: [place] [facility];  Service: Cardiovascular;  Laterality: N/A;  . LEFT HEART CATHETERIZATION WITH CORONARY ANGIOGRAM N/A 12/15/2012   Procedure: LEFT HEART CATHETERIZATION WITH CORONARY ANGIOGRAM;  Surgeon: Laverda Page, MD;  Location: [facility] CATH LAB;  Service: Cardiovascular;  Laterality: N/A;  . LEFT HEART CATHETERIZATION WITH CORONARY ANGIOGRAM N/A 03/26/2013   Procedure: LEFT HEART CATHETERIZATION WITH CORONARY ANGIOGRAM;  Surgeon: Laverda Page, MD;  Location: [facility] CATH LAB;  Service: Cardiovascular;  Laterality: N/A;  . LEFT HEART CATHETERIZATION WITH  CORONARY ANGIOGRAM N/A 04/08/2014   Procedure: LEFT HEART CATHETERIZATION WITH CORONARY ANGIOGRAM;  Surgeon: Blane Ohara, MD;  Location: [facility] CATH LAB;  Service: Cardiovascular;  Laterality: N/A;  . PERCUTANEOUS CORONARY STENT INTERVENTION (PCI-S) N/A 05/03/2014   Procedure: PERCUTANEOUS CORONARY STENT INTERVENTION (PCI-S);  Surgeon: Laverda Page, MD;  Location: [facility] C[facility] LAB;  Service: Cardiovascular;  Laterality: N/A;  . UMBILICAL HERNIA REPAIR      Allergies  Allergen Reactions  . Lyrica [Pregabalin] Other (See Comments)    Made patient very lethargic the next morning, very hard to patient to function, move, etc.   . Statins Other (See Comments)    Causes Restless Legs, rhabdomyolysis  . Metformin And Related     GI upset  . Trulicity [Dulaglutide] Other (See Comments)    GI side effects     Outpatient Encounter Medications as of 04/12/2019  Medication Sig  . acetaminophen (TYLENOL) 500 MG tablet Take 1,000 mg by mouth every 6 (six) hours as needed for headache.   . albuterol (PROVENTIL HFA;VENTOLIN HFA)  108 (90 Base) MCG/ACT inhaler Inhale 2 puffs into the lungs every 6 (six) hours as needed for wheezing or shortness of breath.  . Alirocumab (PRALUENT) 75 MG/ML SOAJ Inject 75 mg into the skin every 14 (fourteen) days.  Marland Kitchen aspirin 81 MG EC tablet Take 1 tablet (81 mg total) by mouth daily.  . BD PEN NEEDLE NANO U/F 32G X 4 MM MISC USE TWICE DAILY WITH LANTUS PEN  . carvedilol (COREG) 6.25 MG tablet TAKE 1/2 TABLET BY MOUTH TWICE DAILY WITH MEALS  . [employer] VITAMIN C 500 MG tablet TAKE 1 TABLET BY MOUTH TWICE DAILY  . D-5000 125 MCG (5000 UT) TABS TAKE 1 TABLET BY MOUTH EVERY DAY  . fluticasone (FLONASE) 50 MCG/ACT nasal spray Place 1 spray into both nostrils daily as needed for allergies or rhinitis.  Marland Kitchen gabapentin (NEURONTIN) 100 MG capsule Take 200 mg by mouth daily after supper.  . Insulin Glargine (BASAGLAR KWIKPEN) 100 UNIT/ML SOPN INJECT 40 UNITS SUBCUTANEOUSLY INTO THE SKIN AT  BEDTIME  . LIFESCAN FINEPOINT LANCETS MISC Use to test for blood sugar three times daily dx: E11.51  . metFORMIN (GLUCOPHAGE) 500 MG tablet Take 1 tablet (500 mg total) by mouth 2 (two) times daily with a meal.  . nitroGLYCERIN (NITROSTAT) 0.4 MG SL tablet PLACE 1 TABLET (0.4 MG TOTAL) UNDER THE TONGUE EVERY 5 (FIVE) MINUTES AS NEEDED FOR CHEST PAIN.  Marland Kitchen ONETOUCH ULTRA test strip USE TO TEST FOR BLOOD SUGAR THREE TIMES DAILY DX: E11.51  . pantoprazole (PROTONIX) 40 MG tablet Take 40 mg by mouth daily.  . pramipexole (MIRAPEX) 1 MG tablet TAKE 2 TABLETS (2 MG TOTAL) BY MOUTH AT BEDTIME. DX G25.81  . valsartan (DIOVAN) 80 MG tablet Take 1 tablet (80 mg total) by mouth daily.   No facility-administered encounter medications on file as of 04/12/2019.    Review of Systems  Constitutional: Positive for appetite change and fatigue. Negative for chills and fever.       98.1  HENT: Negative for congestion, rhinorrhea, sinus pressure, sinus pain, sneezing and sore throat.        Hoarse   Respiratory: Positive for cough. Negative for chest tightness, shortness of breath and wheezing.   Cardiovascular: Negative for chest pain, palpitations and leg swelling.  Gastrointestinal: Positive for diarrhea and nausea. Negative for abdominal distention, abdominal pain, constipation and vomiting.  Endocrine: Negative for cold intolerance, heat intolerance, polydipsia, polyphagia and polyuria.  Genitourinary: Negative for difficulty urinating, dysuria, flank pain, frequency and urgency.    Immunization History  Administered Date(s) Administered  . Pneumococcal Conjugate-13 12/01/2013  . Td 03/04/1998, 03/01/2017  . Tdap 03/06/2011, 02/28/2017   Pertinent  Health Maintenance Due  Topic Date Due  . OPHTHALMOLOGY EXAM  03/20/2019  . INFLUENZA VACCINE  10/04/2019 (Originally 10/03/2018)  . PNA vac Low Risk Adult (2 of 2 - PPSV23) 03/14/2020 (Originally 12/02/2014)  . HEMOGLOBIN A1C  09/06/2019  . FOOT EXAM   03/14/2020  . COLONOSCOPY  10/18/2025   Fall Risk  04/08/2019 03/31/2019 03/15/2019 03/02/2019 01/22/2019  Falls in the past year? 0 0 1 1 0  Number falls in past yr: 0 0 0 0 0  Injury with Fall? 0 0 1 0 0    There were no vitals filed for this visit. There is no height or weight on file to calculate BMI. Physical Exam  Unable to complete on Telephone visit.   Labs reviewed: Recent Labs    04/20/18 1414 05/28/18 0807 03/09/19 5621  NA 138 138 137  K 4.1 4.5 4.4  CL 103 103 102  CO2 24 30 29   GLUCOSE 130* 135* 152*  BUN 11 18 21   CREATININE 0.98 1.16 0.94  CALCIUM 9.6 9.2 10.0   Recent Labs    04/20/18 1414 03/09/19 0832  AST 24 27  ALT 35 34  ALKPHOS 48  --   BILITOT 1.1 0.8  PROT 7.2 6.7  ALBUMIN 4.3  --    Recent Labs    04/20/18 1414 03/09/19 0832  WBC 8.4 5.1  NEUTROABS  --  3,259  HGB 16.6 15.7  HCT 49.2 44.9  MCV 87.5 86.8  PLT 134* 117*   Lab Results  Component Value Date   TSH 2.65 10/09/2015   Lab Results  Component Value Date   HGBA1C 9.2 (H) 03/09/2019   Lab Results  Component Value Date   CHOL 166 03/09/2019   HDL 37 (L) 03/09/2019   LDLCALC 103 (H) 03/09/2019   TRIG 159 (H) 03/09/2019   CHOLHDL 4.5 03/09/2019    Significant Diagnostic Results in last 30 days:  DG Chest 2 View  Result Date: 04/08/2019 CLINICAL DATA:  Nonproductive cough for 6 months. EXAM: CHEST - 2 VIEW COMPARISON:  09/18/2015 from [facility] FINDINGS: The heart size and mediastinal contours are within normal limits. Both lungs are clear. The visualized skeletal structures are unremarkable. IMPRESSION: No active cardiopulmonary disease. Electronically Signed   By: 09/20/2015 M.D.   On: 04/08/2019 15:28    Assessment/Plan  1. Cough with exposure to COVID-19 virus Afebrile.Status post antibodies infusion 04/11/2019.No chest tightness,chest pain or shortness of breath reported. - continue on vitamin C and vit D supplement  - Advised to practice self  Quarantine,social distance,wear facial mask and practice hand hygiene  Per CDC guidelines.He was advised at Urgent care to self Quarantine for another 10 days and wife to [place] for 19 days.Wife has not been tasted for COVID-19.  - Additional COVID-19 education information provided on AVS - Advised to Notify provider or call 9-1-1 if symptoms worsen or fail to improve.Instructed to go to ED if he develops any chest tightness,chest pain,shortness of breath or wheezing.   2. Nausea in adult No vomiting or abdominal pain reported  - ondansetron (ZOFRAN) 4 MG tablet; Take 1 tablet (4 mg total) by mouth every 8 (eight) hours as needed for nausea or vomiting.  Dispense: 20 tablet; Refill: 0  3. Diarrhea, unspecified type Suspected one of the signs of COVID-19 - appetite not good but eats as tolerated.  -will add OTC antidiarrhea Imodium as needed.  - encouraged to increase fluid intake or soups to stay hydrated. - Notify provider if symptoms worsen or not resolved.   Family/ staff Communication: Reviewed plan of care with patient and wife.  Labs/tests ordered: None   Next Appointment: Has upcoming appointment with Dr.Reeed 06/17/2019   Spent 11 minutes of non-face to face with patient   [place], NP

## 2019-04-12 NOTE — Telephone Encounter (Signed)
Appointment scheduled for this afternoon with Dinah.

## 2019-04-12 NOTE — Telephone Encounter (Signed)
I'm sorry to hear he is so sick.  I'd suggest we get him on the NP schedule for a virtual visit this afternoon so they can at least eyeball him from a distance and be sure he does not require hospitalization.  If they are willing/able, please schedule him.

## 2019-04-12 NOTE — Telephone Encounter (Signed)
Rinaldo Cloud, wife called and stated that patient is very sick. Stated that Friday he did test POSITIVE for Covid. Was given Mucinex DM and Cough Medication and went Sunday for the Casirivimab/imdevimab infusion.   Wife stated that he is very sick, won't eat or drink, weak and nauseated.   Wife is wanting to know if Zinc, Vitaming D and Vitamin C and Phenergan can be prescribed.   Please Advise.

## 2019-04-14 ENCOUNTER — Other Ambulatory Visit: Payer: Self-pay

## 2019-04-14 ENCOUNTER — Emergency Department (HOSPITAL_COMMUNITY): Payer: Medicare Other

## 2019-04-14 ENCOUNTER — Encounter (HOSPITAL_COMMUNITY): Payer: Self-pay | Admitting: Emergency Medicine

## 2019-04-14 ENCOUNTER — Inpatient Hospital Stay (HOSPITAL_COMMUNITY)
Admission: EM | Admit: 2019-04-14 | Discharge: 2019-04-18 | DRG: 177 | Disposition: A | Discharge status: Home / Self Care | Payer: Medicare Other | Attending: Internal Medicine | Admitting: Internal Medicine

## 2019-04-14 DIAGNOSIS — M1812 Unilateral primary osteoarthritis of first carpometacarpal joint, left hand: Secondary | ICD-10-CM | POA: Diagnosis present

## 2019-04-14 DIAGNOSIS — E785 Hyperlipidemia, unspecified: Secondary | ICD-10-CM | POA: Diagnosis present

## 2019-04-14 DIAGNOSIS — J1282 Pneumonia due to coronavirus disease 2019: Secondary | ICD-10-CM | POA: Diagnosis present

## 2019-04-14 DIAGNOSIS — Z955 Presence of coronary angioplasty implant and graft: Secondary | ICD-10-CM

## 2019-04-14 DIAGNOSIS — R0789 Other chest pain: Secondary | ICD-10-CM | POA: Diagnosis not present

## 2019-04-14 DIAGNOSIS — E1151 Type 2 diabetes mellitus with diabetic peripheral angiopathy without gangrene: Secondary | ICD-10-CM | POA: Diagnosis present

## 2019-04-14 DIAGNOSIS — I10 Essential (primary) hypertension: Secondary | ICD-10-CM | POA: Diagnosis present

## 2019-04-14 DIAGNOSIS — Z79899 Other long term (current) drug therapy: Secondary | ICD-10-CM

## 2019-04-14 DIAGNOSIS — J9601 Acute respiratory failure with hypoxia: Secondary | ICD-10-CM | POA: Diagnosis present

## 2019-04-14 DIAGNOSIS — K219 Gastro-esophageal reflux disease without esophagitis: Secondary | ICD-10-CM | POA: Diagnosis present

## 2019-04-14 DIAGNOSIS — K2270 Barrett's esophagus without dysplasia: Secondary | ICD-10-CM | POA: Diagnosis present

## 2019-04-14 DIAGNOSIS — E876 Hypokalemia: Secondary | ICD-10-CM | POA: Diagnosis present

## 2019-04-14 DIAGNOSIS — I25118 Atherosclerotic heart disease of native coronary artery with other forms of angina pectoris: Secondary | ICD-10-CM | POA: Diagnosis not present

## 2019-04-14 DIAGNOSIS — E1165 Type 2 diabetes mellitus with hyperglycemia: Secondary | ICD-10-CM | POA: Diagnosis present

## 2019-04-14 DIAGNOSIS — IMO0002 Reserved for concepts with insufficient information to code with codable children: Secondary | ICD-10-CM | POA: Diagnosis present

## 2019-04-14 DIAGNOSIS — Z7982 Long term (current) use of aspirin: Secondary | ICD-10-CM | POA: Diagnosis not present

## 2019-04-14 DIAGNOSIS — Z794 Long term (current) use of insulin: Secondary | ICD-10-CM

## 2019-04-14 DIAGNOSIS — G2581 Restless legs syndrome: Secondary | ICD-10-CM | POA: Diagnosis present

## 2019-04-14 DIAGNOSIS — Z23 Encounter for immunization: Secondary | ICD-10-CM | POA: Diagnosis not present

## 2019-04-14 DIAGNOSIS — I2510 Atherosclerotic heart disease of native coronary artery without angina pectoris: Secondary | ICD-10-CM | POA: Diagnosis present

## 2019-04-14 DIAGNOSIS — U071 COVID-19: Principal | ICD-10-CM

## 2019-04-14 DIAGNOSIS — R0602 Shortness of breath: Secondary | ICD-10-CM | POA: Diagnosis not present

## 2019-04-14 LAB — HEPATIC FUNCTION PANEL
ALT: 24 U/L (ref 0–44)
AST: 26 U/L (ref 15–41)
Albumin: 2.9 g/dL — ABNORMAL LOW (ref 3.5–5.0)
Alkaline Phosphatase: 57 U/L (ref 38–126)
Bilirubin, Direct: 0.3 mg/dL — ABNORMAL HIGH (ref 0.0–0.2)
Indirect Bilirubin: 1.1 mg/dL — ABNORMAL HIGH (ref 0.3–0.9)
Total Bilirubin: 1.4 mg/dL — ABNORMAL HIGH (ref 0.3–1.2)
Total Protein: 6.6 g/dL (ref 6.5–8.1)

## 2019-04-14 LAB — LACTATE DEHYDROGENASE: LDH: 275 U/L — ABNORMAL HIGH (ref 98–192)

## 2019-04-14 LAB — LACTIC ACID, PLASMA: Lactic Acid, Venous: 1.2 mmol/L (ref 0.5–1.9)

## 2019-04-14 LAB — BRAIN NATRIURETIC PEPTIDE: B Natriuretic Peptide: 83 pg/mL (ref 0.0–100.0)

## 2019-04-14 LAB — BASIC METABOLIC PANEL
Anion gap: 11 (ref 5–15)
BUN: 16 mg/dL (ref 8–23)
CO2: 25 mmol/L (ref 22–32)
Calcium: 8.4 mg/dL — ABNORMAL LOW (ref 8.9–10.3)
Chloride: 97 mmol/L — ABNORMAL LOW (ref 98–111)
Creatinine, Ser: 1.11 mg/dL (ref 0.61–1.24)
GFR calc Af Amer: >60 mL/min (ref >60)
GFR calc non Af Amer: >60 mL/min (ref >60)
Glucose, Bld: 112 mg/dL — ABNORMAL HIGH (ref 70–99)
Potassium: 3.4 mmol/L — ABNORMAL LOW (ref 3.5–5.1)
Sodium: 133 mmol/L — ABNORMAL LOW (ref 135–145)

## 2019-04-14 LAB — C-REACTIVE PROTEIN: CRP: 13.6 mg/dL — ABNORMAL HIGH (ref <1.0)

## 2019-04-14 LAB — CBC
HCT: 43.4 % (ref 39.0–52.0)
Hemoglobin: 14.8 g/dL (ref 13.0–17.0)
MCH: 30 pg (ref 26.0–34.0)
MCHC: 34.1 g/dL (ref 30.0–36.0)
MCV: 88 fL (ref 80.0–100.0)
Platelets: 164 10*3/uL (ref 150–400)
RBC: 4.93 MIL/uL (ref 4.22–5.81)
RDW: 11.9 % (ref 11.5–15.5)
WBC: 6.5 10*3/uL (ref 4.0–10.5)
nRBC: 0 % (ref 0.0–0.2)

## 2019-04-14 LAB — CBG MONITORING, ED
Glucose-Capillary: 101 mg/dL — ABNORMAL HIGH (ref 70–99)
Glucose-Capillary: 106 mg/dL — ABNORMAL HIGH (ref 70–99)

## 2019-04-14 LAB — TROPONIN I (HIGH SENSITIVITY)
Troponin I (High Sensitivity): 16 ng/L (ref <18)
Troponin I (High Sensitivity): 14 ng/L (ref <18)

## 2019-04-14 LAB — HIV ANTIBODY (ROUTINE TESTING W REFLEX): HIV Screen 4th Generation wRfx: NONREACTIVE

## 2019-04-14 LAB — RESPIRATORY PANEL BY RT PCR (FLU A&B, COVID)
Influenza A by PCR: NEGATIVE
Influenza B by PCR: NEGATIVE
SARS Coronavirus 2 by RT PCR: POSITIVE — AB

## 2019-04-14 LAB — D-DIMER, QUANTITATIVE: D-Dimer, Quant: 1.22 ug/mL-FEU — ABNORMAL HIGH (ref 0.00–0.50)

## 2019-04-14 LAB — PROCALCITONIN: Procalcitonin: <0.1 ng/mL

## 2019-04-14 LAB — FIBRINOGEN: Fibrinogen: 688 mg/dL — ABNORMAL HIGH (ref 210–475)

## 2019-04-14 LAB — GLUCOSE, CAPILLARY
Glucose-Capillary: 132 mg/dL — ABNORMAL HIGH (ref 70–99)
Glucose-Capillary: 322 mg/dL — ABNORMAL HIGH (ref 70–99)
Glucose-Capillary: 275 mg/dL — ABNORMAL HIGH (ref 70–99)

## 2019-04-14 LAB — FERRITIN: Ferritin: 614 ng/mL — ABNORMAL HIGH (ref 24–336)

## 2019-04-14 LAB — TRIGLYCERIDES: Triglycerides: 41 mg/dL (ref <150)

## 2019-04-14 MED ORDER — BASAGLAR KWIKPEN 100 UNIT/ML ~~LOC~~ SOPN
40.0000 [IU] | PEN_INJECTOR | Freq: Every day | SUBCUTANEOUS | Status: DC
  Filled 2019-04-14: qty 3

## 2019-04-14 MED ORDER — ASPIRIN EC 81 MG PO TBEC
81.0000 mg | DELAYED_RELEASE_TABLET | Freq: Every day | ORAL | Status: DC
  Administered 2019-04-14 – 2019-04-18 (×5): 81 mg via ORAL
  Filled 2019-04-14 (×5): qty 1

## 2019-04-14 MED ORDER — INSULIN ASPART 100 UNIT/ML ~~LOC~~ SOLN
0.0000 [IU] | Freq: Three times a day (TID) | SUBCUTANEOUS | Status: DC
  Administered 2019-04-14: 2 [IU] via SUBCUTANEOUS
  Administered 2019-04-14: 11 [IU] via SUBCUTANEOUS
  Administered 2019-04-15: 5 [IU] via SUBCUTANEOUS
  Administered 2019-04-15 – 2019-04-16 (×3): 2 [IU] via SUBCUTANEOUS
  Administered 2019-04-16: 16:00:00 5 [IU] via SUBCUTANEOUS
  Administered 2019-04-17: 18:00:00 3 [IU] via SUBCUTANEOUS
  Administered 2019-04-17: 2 [IU] via SUBCUTANEOUS
  Administered 2019-04-17: 14:00:00 5 [IU] via SUBCUTANEOUS
  Administered 2019-04-18: 13:00:00 3 [IU] via SUBCUTANEOUS

## 2019-04-14 MED ORDER — PRAMIPEXOLE DIHYDROCHLORIDE 1 MG PO TABS
1.0000 mg | ORAL_TABLET | Freq: Every day | ORAL | Status: DC | PRN
  Administered 2019-04-14: 18:00:00 1 mg via ORAL
  Filled 2019-04-14 (×3): qty 1

## 2019-04-14 MED ORDER — GUAIFENESIN-DM 100-10 MG/5ML PO SYRP: 10.0000 mL | ORAL_SOLUTION | ORAL | Status: DC | PRN

## 2019-04-14 MED ORDER — INSULIN GLARGINE 100 UNIT/ML ~~LOC~~ SOLN
40.0000 [IU] | Freq: Every day | SUBCUTANEOUS | Status: DC
  Administered 2019-04-14 – 2019-04-17 (×3): 40 [IU] via SUBCUTANEOUS
  Filled 2019-04-14 (×5): qty 0.4

## 2019-04-14 MED ORDER — ALBUTEROL SULFATE HFA 108 (90 BASE) MCG/ACT IN AERS
2.0000 | INHALATION_SPRAY | Freq: Four times a day (QID) | RESPIRATORY_TRACT | Status: DC
  Administered 2019-04-14 (×3): 2 via RESPIRATORY_TRACT
  Filled 2019-04-14 (×2): qty 6.7

## 2019-04-14 MED ORDER — POTASSIUM CHLORIDE CRYS ER 20 MEQ PO TBCR
30.0000 meq | EXTENDED_RELEASE_TABLET | ORAL | Status: AC
  Administered 2019-04-14: 30 meq via ORAL
  Filled 2019-04-14: qty 1

## 2019-04-14 MED ORDER — PRAMIPEXOLE DIHYDROCHLORIDE 1 MG PO TABS
1.0000 mg | ORAL_TABLET | Freq: Every day | ORAL | Status: DC
  Administered 2019-04-14 – 2019-04-15 (×2): 1 mg via ORAL
  Filled 2019-04-14 (×4): qty 1

## 2019-04-14 MED ORDER — SODIUM CHLORIDE 0.9 % IV SOLN
200.0000 mg | Freq: Once | INTRAVENOUS | Status: AC
  Administered 2019-04-14: 200 mg via INTRAVENOUS
  Filled 2019-04-14: qty 200

## 2019-04-14 MED ORDER — SODIUM CHLORIDE 0.9% FLUSH: 3.0000 mL | Freq: Once | INTRAVENOUS | Status: DC

## 2019-04-14 MED ORDER — FAMOTIDINE 20 MG PO TABS
20.0000 mg | ORAL_TABLET | Freq: Two times a day (BID) | ORAL | Status: DC
  Administered 2019-04-14 – 2019-04-18 (×9): 20 mg via ORAL
  Filled 2019-04-14 (×9): qty 1

## 2019-04-14 MED ORDER — SODIUM CHLORIDE 0.9 % IV SOLN: Freq: Once | INTRAVENOUS | Status: AC

## 2019-04-14 MED ORDER — DEXAMETHASONE SODIUM PHOSPHATE 10 MG/ML IJ SOLN
6.0000 mg | Freq: Once | INTRAMUSCULAR | Status: AC
  Administered 2019-04-14: 6 mg via INTRAVENOUS
  Filled 2019-04-14: qty 1

## 2019-04-14 MED ORDER — ZINC SULFATE 220 (50 ZN) MG PO CAPS
220.0000 mg | ORAL_CAPSULE | Freq: Every day | ORAL | Status: DC
  Administered 2019-04-14 – 2019-04-18 (×5): 220 mg via ORAL
  Filled 2019-04-14 (×5): qty 1

## 2019-04-14 MED ORDER — ALBUTEROL SULFATE HFA 108 (90 BASE) MCG/ACT IN AERS
2.0000 | INHALATION_SPRAY | Freq: Three times a day (TID) | RESPIRATORY_TRACT | Status: DC
  Administered 2019-04-15 – 2019-04-18 (×10): 2 via RESPIRATORY_TRACT
  Filled 2019-04-14: qty 6.7

## 2019-04-14 MED ORDER — ASCORBIC ACID 500 MG PO TABS
500.0000 mg | ORAL_TABLET | Freq: Every day | ORAL | Status: DC
  Administered 2019-04-14 – 2019-04-18 (×5): 500 mg via ORAL
  Filled 2019-04-14 (×5): qty 1

## 2019-04-14 MED ORDER — SODIUM CHLORIDE 0.9 % IV SOLN
100.0000 mg | Freq: Every day | INTRAVENOUS | Status: AC
  Administered 2019-04-15 – 2019-04-18 (×4): 100 mg via INTRAVENOUS
  Filled 2019-04-14 (×5): qty 20

## 2019-04-14 MED ORDER — ACETAMINOPHEN 325 MG PO TABS: 650.0000 mg | ORAL_TABLET | Freq: Four times a day (QID) | ORAL | Status: DC | PRN

## 2019-04-14 MED ORDER — IRBESARTAN 75 MG PO TABS
75.0000 mg | ORAL_TABLET | Freq: Every day | ORAL | Status: DC
  Administered 2019-04-14 – 2019-04-15 (×2): 75 mg via ORAL
  Filled 2019-04-14 (×4): qty 1

## 2019-04-14 MED ORDER — GABAPENTIN 100 MG PO CAPS
200.0000 mg | ORAL_CAPSULE | Freq: Every day | ORAL | Status: DC
  Administered 2019-04-14 – 2019-04-15 (×2): 200 mg via ORAL
  Administered 2019-04-16: 100 mg via ORAL
  Administered 2019-04-17: 18:00:00 200 mg via ORAL
  Filled 2019-04-14 (×4): qty 2

## 2019-04-14 MED ORDER — ONDANSETRON HCL 4 MG PO TABS
4.0000 mg | ORAL_TABLET | Freq: Four times a day (QID) | ORAL | Status: DC | PRN
  Administered 2019-04-16: 22:00:00 4 mg via ORAL
  Filled 2019-04-14: qty 1

## 2019-04-14 MED ORDER — SODIUM CHLORIDE 0.9% FLUSH
3.0000 mL | Freq: Two times a day (BID) | INTRAVENOUS | Status: DC
  Administered 2019-04-15 – 2019-04-18 (×7): 3 mL via INTRAVENOUS

## 2019-04-14 MED ORDER — ENOXAPARIN SODIUM 40 MG/0.4ML ~~LOC~~ SOLN
40.0000 mg | SUBCUTANEOUS | Status: DC
  Administered 2019-04-14 – 2019-04-17 (×4): 40 mg via SUBCUTANEOUS
  Filled 2019-04-14 (×4): qty 0.4

## 2019-04-14 MED ORDER — CARVEDILOL 6.25 MG PO TABS
6.2500 mg | ORAL_TABLET | Freq: Two times a day (BID) | ORAL | Status: DC
  Administered 2019-04-14 (×2): 6.25 mg via ORAL
  Filled 2019-04-14 (×3): qty 1

## 2019-04-14 MED ORDER — FLUTICASONE PROPIONATE 50 MCG/ACT NA SUSP
1.0000 | Freq: Every day | NASAL | Status: DC | PRN
  Filled 2019-04-14: qty 16

## 2019-04-14 MED ORDER — DEXAMETHASONE 6 MG PO TABS
6.0000 mg | ORAL_TABLET | ORAL | Status: DC
  Administered 2019-04-15: 6 mg via ORAL
  Filled 2019-04-14: qty 1

## 2019-04-14 MED ORDER — ACETAMINOPHEN 500 MG PO TABS
1000.0000 mg | ORAL_TABLET | Freq: Once | ORAL | Status: AC
  Administered 2019-04-14: 1000 mg via ORAL
  Filled 2019-04-14: qty 2

## 2019-04-14 MED ORDER — ONDANSETRON HCL 4 MG/2ML IJ SOLN: 4.0000 mg | Freq: Four times a day (QID) | INTRAMUSCULAR | Status: DC | PRN

## 2019-04-14 MED ORDER — HYDROCOD POLST-CPM POLST ER 10-8 MG/5ML PO SUER
5.0000 mL | Freq: Two times a day (BID) | ORAL | Status: DC | PRN
  Administered 2019-04-16: 5 mL via ORAL
  Filled 2019-04-14: qty 5

## 2019-04-14 MED ORDER — HYDROMORPHONE HCL 1 MG/ML IJ SOLN
0.5000 mg | INTRAMUSCULAR | Status: AC | PRN
  Administered 2019-04-14 – 2019-04-15 (×2): 0.5 mg via INTRAVENOUS
  Filled 2019-04-14 (×2): qty 0.5

## 2019-04-14 NOTE — H&P (Signed)
History and Physical    Kenneth Pinheiro Sr. SWH:[id number] DOB: 1950-06-23 DOA: 04/14/2019  Referring MD/NP/PA: Midge Minium, MD PCP: Kermit Balo, DO  Patient coming from: Home  Chief Complaint: Shortness of breath  I have personally briefly reviewed patient's old medical records in [facility]   HPI: Kenneth Macmillan Sr. is a 69 y.o. male with medical history significant of  Symptoms initially started on 17 January with complaints of sore throat progressed to a dry cough.  He was seen by his PCP who gave him a 7-day course of Biaxin, but states that it didn't help his symptoms.  He thereafter tested positive for COVID-19 on February 5.  He was taking vitamin C and vitmin D. Notes associated symptoms of change and taste, decreased appetite, and diarrhea.  He received infusion of casirivimab\imdevimab 3 days ago.  Patient reported having some substernal chest pain yesterday for which he called EMS.  After being evaluated it was determined that he did not require transport.  Denies having any vomiting, myalgias, headaches, or dysuria. He has never smoked or required oxygen.  This morning around 3 AM patient reported feeling like he could not get any air.  ED Course: Upon admission into the emergency department patient was seen to be afebrile, respirations 01/29/1962, O2 saturations as low as 88% on room air, and placed on 2 L nasal cannula oxygen.  Labs significant for WBC 6.5, potassium 3.4, total bilirubin 1.4, LDH 255, lactic acid 1.2, and high-sensitivity troponin negative x2.  Chest x-ray significant for multifocal pneumonia.  Patient was given 6 mg of Decadron IV, remdesivir, and 1000 mg of acetaminophen. TRH called to admit.  Review of Systems  Constitutional: Positive for fever and malaise/fatigue.  HENT: Positive for sore throat. Negative for ear pain and nosebleeds.   Eyes: Negative for pain and discharge.  Respiratory: Positive for cough and shortness of breath. Negative for  wheezing.   Cardiovascular: Positive for chest pain. Negative for leg swelling.  Gastrointestinal: Positive for diarrhea. Negative for abdominal pain, nausea and vomiting.  Genitourinary: Negative for dysuria and hematuria.  Musculoskeletal: Negative for falls and myalgias.  Skin: Negative for itching and rash.  Neurological: Positive for weakness. Negative for loss of consciousness and headaches.  Psychiatric/Behavioral: Negative for substance abuse. The patient has insomnia.     Past Medical History:  Diagnosis Date  . Arthritis    "left thumb" (05/23/2017)  . Barrett's esophagus    "we've been told that it's all gone; still take RX for GERD" (05/23/2017)  . CAD (coronary artery disease), native coronary artery    Coronary angiogram 05/03/2014:  Proximal LAD 3.0x12 mm Promus premier stent.  04/08/2014: Mid Cx 3.5 x 16 mm Promus DES.  03/29/2013: Mid LAD 2.75 x 38 mm Promus Premier drug-eluting stent, balloon angioplasty of D1 and distal LAD and stenting of distal RCA with 2.75 x 24 mm promos Premier drug-eluting stent 12/15/2012 widely patent.  . Cervicalgia   . Chronic bronchitis (HCC)    "q yr" (05/23/2017)  . Complication of anesthesia    " I do not wake up very well "  . COVID-19   . Family history of adverse reaction to anesthesia    "son w/PONV"  . GERD (gastroesophageal reflux disease)   . Heart murmur    "grew out of it"   . Hyperlipidemia   . Hypoglycemia, unspecified   . IDDM (insulin dependent diabetes mellitus)   . Impotence of organic origin   . Iron deficiency  anemia   . Other malaise and fatigue   . PONV (postoperative nausea and vomiting)   . Restless leg   . Tension headache    "sometimes" (05/23/2017)  . Unspecified gastritis and gastroduodenitis with hemorrhage   . Unstable angina pectoris (HCC) 04/08/2014   Coronary angiogram 05/03/2014:  Proximal LAD 3.0x12 mm Promus premier stent.  04/08/2014: Mid Cx 3.5 x 16 mm Promus DES.  03/29/2013: Mid LAD 2.75 x 38 mm Promus  Premier drug-eluting stent, balloon angioplasty of D1 and distal LAD and stenting of distal RCA with 2.75 x 24 mm promos Premier drug-eluting stent 12/15/2012 widely patent.    Past Surgical History:  Procedure Laterality Date  . CARDIAC CATHETERIZATION N/A 09/25/2015   Procedure: Left Heart Cath and Coronary Angiography;  Surgeon: [name], MD;  Location: [facility] INVASIVE CV LAB;  Service: Cardiovascular;  Laterality: N/A;  . CARDIAC CATHETERIZATION N/A 09/25/2015   Procedure: Intravascular Pressure Wire/FFR Study;  Surgeon: [name], MD;  Location: [facility] INVASIVE CV LAB;  Service: Cardiovascular;  Laterality: N/A;  . CORONARY ANGIOPLASTY WITH STENT PLACEMENT  12/15/2012; 04/28/2014; 05/03/2014   "2; 1; 1"   . FRACTIONAL FLOW RESERVE WIRE Right 03/26/2013   Procedure: FRACTIONAL FLOW RESERVE WIRE;  Surgeon: Pamella Pert, MD;  Location: [facility] CATH LAB;  Service: Cardiovascular;  Laterality: Right;  . FRACTIONAL FLOW RESERVE WIRE N/A 05/03/2014   Procedure: FRACTIONAL FLOW RESERVE WIRE;  Surgeon: Pamella Pert, MD;  Location: [facility] CATH LAB;  Service: Cardiovascular;  Laterality: N/A;  . HERNIA REPAIR  2010   "umbilical"   . INCISION AND DRAINAGE ABSCESS Left 05/2007   "groin"   . KNEE SURGERY Right 1992;  2000   "calcium deposits removed" (12/15/2012)  . LEFT HEART CATH AND CORONARY ANGIOGRAPHY N/A 05/23/2017   Procedure: LEFT HEART CATH AND CORONARY ANGIOGRAPHY;  Surgeon: [name], MD;  Location: [facility] INVASIVE CV LAB;  Service: Cardiovascular;  Laterality: N/A;  . LEFT HEART CATHETERIZATION WITH CORONARY ANGIOGRAM N/A 12/15/2012   Procedure: LEFT HEART CATHETERIZATION WITH CORONARY ANGIOGRAM;  Surgeon: Pamella Pert, MD;  Location: [facility] CATH LAB;  Service: Cardiovascular;  Laterality: N/A;  . LEFT HEART CATHETERIZATION WITH CORONARY ANGIOGRAM N/A 03/26/2013   Procedure: LEFT HEART CATHETERIZATION WITH CORONARY ANGIOGRAM;  Surgeon: Pamella Pert, MD;  Location: [facility] CATH LAB;  Service:  Cardiovascular;  Laterality: N/A;  . LEFT HEART CATHETERIZATION WITH CORONARY ANGIOGRAM N/A 04/08/2014   Procedure: LEFT HEART CATHETERIZATION WITH CORONARY ANGIOGRAM;  Surgeon: Micheline Chapman, MD;  Location: [facility] CATH LAB;  Service: Cardiovascular;  Laterality: N/A;  . PERCUTANEOUS CORONARY STENT INTERVENTION (PCI-S) N/A 05/03/2014   Procedure: PERCUTANEOUS CORONARY STENT INTERVENTION (PCI-S);  Surgeon: Pamella Pert, MD;  Location: [facility] CATH LAB;  Service: Cardiovascular;  Laterality: N/A;  . UMBILICAL HERNIA REPAIR       reports that he has never smoked. He has never used smokeless tobacco. He reports that he does not drink alcohol or use drugs.  Allergies  Allergen Reactions  . Lyrica [Pregabalin] Other (See Comments)    Made patient very lethargic the next morning, very hard to patient to function, move, etc.   . Statins Other (See Comments)    Causes Restless Legs, rhabdomyolysis  . Metformin And Related     GI upset  . Trulicity [Dulaglutide] Other (See Comments)    GI side effects     Family History  Adopted: Yes  Family history unknown: Yes    Prior to Admission medications  Medication Sig Start Date End Date Taking? Authorizing Provider  acetaminophen (TYLENOL) 500 MG tablet Take 1,000 mg by mouth every 6 (six) hours as needed for headache.     [provider]  albuterol (PROVENTIL HFA;VENTOLIN HFA) 108 (90 Base) MCG/ACT inhaler Inhale 2 puffs into the lungs every 6 (six) hours as needed for wheezing or shortness of breath. 06/08/18   Reed, Tiffany L, DO  Alirocumab (PRALUENT) 75 MG/ML SOAJ Inject 75 mg into the skin every 14 (fourteen) days. 03/29/19   Adrian Prows, MD  aspirin 81 MG EC tablet Take 1 tablet (81 mg total) by mouth daily. 12/16/12   Adrian Prows, MD  BD PEN NEEDLE NANO U/F 32G X 4 MM MISC USE TWICE DAILY WITH LANTUS PEN 06/01/18   Reed, Tiffany L, DO  carvedilol (COREG) 6.25 MG tablet TAKE 1/2 TABLET BY MOUTH TWICE DAILY WITH MEALS 08/20/18   Reed, Tiffany  L, DO  [employer] VITAMIN C 500 MG tablet TAKE 1 TABLET BY MOUTH TWICE DAILY 03/10/19   Reed, Tiffany L, DO  D-5000 125 MCG (5000 UT) TABS TAKE 1 TABLET BY MOUTH EVERY DAY 03/10/19   Reed, Tiffany L, DO  fluticasone (FLONASE) 50 MCG/ACT nasal spray Place 1 spray into both nostrils daily as needed for allergies or rhinitis.    [provider]  gabapentin (NEURONTIN) 100 MG capsule Take 200 mg by mouth daily after supper. 05/14/17   [provider]  Insulin Glargine (BASAGLAR KWIKPEN) 100 UNIT/ML SOPN INJECT 40 UNITS SUBCUTANEOUSLY INTO THE SKIN AT BEDTIME 04/30/18   Mariea Clonts, Tiffany L, DO  LIFESCAN FINEPOINT LANCETS MISC Use to test for blood sugar three times daily dx: E11.51 06/01/18   Lauree Chandler, NP  metFORMIN (GLUCOPHAGE) 500 MG tablet Take 1 tablet (500 mg total) by mouth 2 (two) times daily with a meal. 03/17/19   Reed, Tiffany L, DO  nitroGLYCERIN (NITROSTAT) 0.4 MG SL tablet PLACE 1 TABLET (0.4 MG TOTAL) UNDER THE TONGUE EVERY 5 (FIVE) MINUTES AS NEEDED FOR CHEST PAIN. 06/23/17   Reed, Tiffany L, DO  ondansetron (ZOFRAN) 4 MG tablet Take 1 tablet (4 mg total) by mouth every 8 (eight) hours as needed for nausea or vomiting. 04/12/19   Ngetich, Nelda Bucks, NP  ONETOUCH ULTRA test strip USE TO TEST FOR BLOOD SUGAR THREE TIMES DAILY DX: E11.51 12/04/18   Reed, Tiffany L, DO  pantoprazole (PROTONIX) 40 MG tablet Take 40 mg by mouth daily.    [provider]  pramipexole (MIRAPEX) 1 MG tablet TAKE 2 TABLETS (2 MG TOTAL) BY MOUTH AT BEDTIME. DX G25.81 03/26/19   Reed, Tiffany L, DO  valsartan (DIOVAN) 80 MG tablet Take 1 tablet (80 mg total) by mouth daily. 03/17/19   Adrian Prows, MD    Physical Exam:  Constitutional: Elderly male who appears to be acutely ill. Vitals:   04/14/19 0615 04/14/19 0617 04/14/19 0630 04/14/19 0645  BP: 124/74  125/77 117/75  Pulse: (!) 59  (!) 47 (!) 50  Resp: (!) 28  16 11   Temp:      TempSrc:      SpO2: 100% (!) 88% 96% 94%   Eyes: PERRL, lids and  conjunctivae normal ENMT: Mucous membranes are moist. Posterior pharynx clear of any exudate or lesions.  Neck: normal, supple, no masses, no thyromegaly Respiratory: Normal respiratory effort without significant wheezes or rhonchi appreciated.  Patient currently on 2 L nasal cannula oxygen able to talk in complete sentences.  Patient intermittently coughs during  evaluation. Cardiovascular: Bradycardic, no murmurs / rubs / gallops. No extremity edema. 2+ pedal pulses. No carotid bruits.  No tenderness to palpation. Abdomen: no tenderness, no masses palpated. No hepatosplenomegaly. Bowel sounds positive.  Musculoskeletal: no clubbing / cyanosis. No joint deformity upper and lower extremities. Good ROM, no contractures. Normal muscle tone.  Skin: no rashes, lesions, ulcers. No induration Neurologic: CN 2-12 grossly intact. Sensation intact, DTR normal. Strength 5/5 in all 4.  Psychiatric: Normal judgment and insight. Alert and oriented x 3. Normal mood.     Labs on Admission: I have personally reviewed following labs and imaging studies  CBC: Recent Labs  Lab 04/14/19 0358  WBC 6.5  HGB 14.8  HCT 43.4  MCV 88.0  PLT 164   Basic Metabolic Panel: Recent Labs  Lab 04/14/19 0358  NA 133*  K 3.4*  CL 97*  CO2 25  GLUCOSE 112*  BUN 16  CREATININE 1.11  CALCIUM 8.4*   GFR: CrCl cannot be calculated (Unknown ideal weight.). Liver Function Tests: Recent Labs  Lab 04/14/19 0540  AST 26  ALT 24  ALKPHOS 57  BILITOT 1.4*  PROT 6.6  ALBUMIN 2.9*   No results for input(s): LIPASE, AMYLASE in the last 168 hours. No results for input(s): AMMONIA in the last 168 hours. Coagulation Profile: No results for input(s): INR, PROTIME in the last 168 hours. Cardiac Enzymes: No results for input(s): CKTOTAL, CKMB, CKMBINDEX, TROPONINI in the last 168 hours. BNP (last 3 results) No results for input(s): PROBNP in the last 8760 hours. HbA1C: No results for input(s): HGBA1C in the last  72 hours. CBG: No results for input(s): GLUCAP in the last 168 hours. Lipid Profile: Recent Labs    04/14/19 0540  TRIG 41   Thyroid Function Tests: No results for input(s): TSH, T4TOTAL, FREET4, T3FREE, THYROIDAB in the last 72 hours. Anemia Panel: No results for input(s): VITAMINB12, FOLATE, FERRITIN, TIBC, IRON, RETICCTPCT in the last 72 hours. Urine analysis:    Component Value Date/Time   COLORURINE YELLOW 04/20/2018 1923   APPEARANCEUR HAZY (A) 04/20/2018 1923   LABSPEC 1.018 04/20/2018 1923   PHURINE 7.0 04/20/2018 1923   GLUCOSEU NEGATIVE 04/20/2018 1923   HGBUR NEGATIVE 04/20/2018 1923   BILIRUBINUR NEGATIVE 04/20/2018 1923   KETONESUR 5 (A) 04/20/2018 1923   PROTEINUR NEGATIVE 04/20/2018 1923   UROBILINOGEN 0.2 09/21/2013 0925   NITRITE NEGATIVE 04/20/2018 1923   LEUKOCYTESUR NEGATIVE 04/20/2018 1923   Sepsis Labs: No results found for this or any previous visit (from the past 240 hour(s)).   Radiological Exams on Admission: DG Chest Portable 1 View  Result Date: 04/14/2019 CLINICAL DATA:  Chest tightness, COVID-19 positive EXAM: PORTABLE CHEST 1 VIEW COMPARISON:  Radiograph 09/18/2015 FINDINGS: Multifocal min areas of patchy airspace opacity present throughout both lungs. No pneumothorax or effusion. The cardiomediastinal contours are unremarkable. No acute osseous or soft tissue abnormality. Mild degenerative changes in the shoulders. Cardiac monitoring leads overlie the chest. IMPRESSION: Multifocal airspace disease compatible with atypical pneumonia in the setting of COVID-19 positivity. Electronically Signed   By: Kreg Shropshire M.D.   On: 04/14/2019 05:13    EKG: Independently reviewed.  Sinus rhythm at 60 bpm  Assessment/Plan Acute respiratory failure with hypoxia secondary to pneumonia due to COVID-19: Patient presents with complaints of worsening shortness of breath and cough after testing positive for COVID-19 on 2/5.  Chest x-ray noting a multifocal  pneumonia.patient requiring 2 L of nasal cannula oxygen after initial O2 saturations as low as  88% on room air.  He had received an infusion of casirivimab\imdevimab in the outpatient setting and antibiotics without improvement.  Patient had been given 6 mg of Decadron IV and started on remdesivir.   -Admit to medical telemetry bed -COVID-19 order set utilized -Continuous pulse oximetry with nasal cannula oxygen to maintain O2 saturation greater than 90% -Albuterol inhaler -Continue remdesivir per pharmacy -Continue Decadron 6 mg daily -Antitussives as needed -Vitamin C and zinc -Continue to monitor inflammatory markers  Chest pain: Patient reported having substernal chest pain yesterday.  EKG and troponins unremarkable any signs of ischemia.  Suspecting secondary to above. -Continue to monitor  Hypokalemia: Acute.  Initial potassium mildly low at 3.4. -Give 30 mEq of potassium chloride x1 dose -Continue to monitor replace as needed  Essential hypertension: Medications include Coreg 6.25 mg and valsartan 80 mg.  Patient noted to have heart rates 47-62. -continue home medications with holding parameters for HR < 55  Diabetes mellitus type 2(uncontrolled): Patient presents with glucose 112, but last hemoglobin A1c 9.2 on 03/09/2019. Home medications include Metformin 50 mg twice daily and glargine 40 units qhs. -Hypoglycemic protocol -Hold Metformin -Continue gabapentin and glargine 40 units qhs -CBGs before every meal with moderate SSI while on steroids -Adjust regimen as needed  CAD: Patient with previous history of stents. -Continue aspirin  GERD/Barrett's esophagus: Home medications Protonix 40 mg daily -Pepcid twice daily  Restless leg syndrome -Continue current home medication regimen  DVT prophylaxis: lovenox   Code Status: Full Family Communication: Wife updated over the phone Disposition Plan: Likely discharge home once medically stable. Consults called: none  Admission  status: inpatient   Clydie Braun MD [facility] Pager [phone]   If 7PM-7AM, please contact night-coverage [email or web address] Password [email or web address]  04/14/2019, 7:06 AM

## 2019-04-14 NOTE — ED Provider Notes (Signed)
TIME SEEN: 5:12 AM  CHIEF COMPLAINT: Shortness of breath, COVID-19 infection  HPI: Patient is a 69 year old male with a known history of CAD status post stents, insulin-dependent diabetes, hyperlipidemia who presents to the emergency department with shortness of breath.  States on March 21, 2019 he developed symptoms of dry cough and sore throat.  States he called his primary care physician who gave him a week of Biaxin.  He states he did not have any significant improvement.  He was tested for COVID-19 on Friday, February 5 and was positive.  Reports tonight he started feeling more short of breath.  No chest pain.  He is unsure of his last fever.  Has had some loss of smell but not taste.  No vomiting but has had diarrhea.  He is not a smoker.  No history of asthma or COPD.  Does not wear oxygen at home.  PCP - Dr. Hollace Kinnier  ROS: See HPI Constitutional: no fever  Eyes: no drainage  ENT: no runny nose   Cardiovascular:  no chest pain  Resp:  SOB  GI: no vomiting GU: no dysuria Integumentary: no rash  Allergy: no hives  Musculoskeletal: no leg swelling  Neurological: no slurred speech ROS otherwise negative  PAST MEDICAL HISTORY/PAST SURGICAL HISTORY:  Past Medical History:  Diagnosis Date  . Arthritis    "left thumb" (05/23/2017)  . Barrett's esophagus    "we've been told that it's all gone; still take RX for GERD" (05/23/2017)  . CAD (coronary artery disease), native coronary artery    Coronary angiogram 05/03/2014:  Proximal LAD 3.0x12 mm Promus premier stent.  04/08/2014: Mid Cx 3.5 x 16 mm Promus DES.  03/29/2013: Mid LAD 2.75 x 38 mm Promus Premier drug-eluting stent, balloon angioplasty of D1 and distal LAD and stenting of distal RCA with 2.75 x 24 mm promos Premier drug-eluting stent 12/15/2012 widely patent.  . Cervicalgia   . Chronic bronchitis ([place])    "q yr" (05/23/2017)  . Complication of anesthesia    " I do not wake up very well "  . COVID-19   . Family history of  adverse reaction to anesthesia    "son w/PONV"  . GERD (gastroesophageal reflux disease)   . Heart murmur    "grew out of it"   . Hyperlipidemia   . Hypoglycemia, unspecified   . IDDM (insulin dependent diabetes mellitus)   . Impotence of organic origin   . Iron deficiency anemia   . Other malaise and fatigue   . PONV (postoperative nausea and vomiting)   . Restless leg   . Tension headache    "sometimes" (05/23/2017)  . Unspecified gastritis and gastroduodenitis with hemorrhage   . Unstable angina pectoris ([place]) 04/08/2014   Coronary angiogram 05/03/2014:  Proximal LAD 3.0x12 mm Promus premier stent.  04/08/2014: Mid Cx 3.5 x 16 mm Promus DES.  03/29/2013: Mid LAD 2.75 x 38 mm Promus Premier drug-eluting stent, balloon angioplasty of D1 and distal LAD and stenting of distal RCA with 2.75 x 24 mm promos Premier drug-eluting stent 12/15/2012 widely patent.    MEDICATIONS:  Prior to Admission medications   Medication Sig Start Date End Date Taking? Authorizing Provider  acetaminophen (TYLENOL) 500 MG tablet Take 1,000 mg by mouth every 6 (six) hours as needed for headache.     [provider]  albuterol (PROVENTIL HFA;VENTOLIN HFA) 108 (90 Base) MCG/ACT inhaler Inhale 2 puffs into the lungs every 6 (six) hours as needed for wheezing or shortness  of breath. 06/08/18   Reed, Tiffany L, DO  Alirocumab (PRALUENT) 75 MG/ML SOAJ Inject 75 mg into the skin every 14 (fourteen) days. 03/29/19   [name], MD  aspirin 81 MG EC tablet Take 1 tablet (81 mg total) by mouth daily. 12/16/12   [name], MD  BD PEN NEEDLE NANO U/F 32G X 4 MM MISC USE TWICE DAILY WITH LANTUS PEN 06/01/18   Reed, Tiffany L, DO  carvedilol (COREG) 6.25 MG tablet TAKE 1/2 TABLET BY MOUTH TWICE DAILY WITH MEALS 08/20/18   Reed, Tiffany L, DO  [employer] VITAMIN C 500 MG tablet TAKE 1 TABLET BY MOUTH TWICE DAILY 03/10/19   Reed, Tiffany L, DO  D-5000 125 MCG (5000 UT) TABS TAKE 1 TABLET BY MOUTH EVERY DAY 03/10/19   Reed, Tiffany L, DO   fluticasone (FLONASE) 50 MCG/ACT nasal spray Place 1 spray into both nostrils daily as needed for allergies or rhinitis.    [provider]  gabapentin (NEURONTIN) 100 MG capsule Take 200 mg by mouth daily after supper. 05/14/17   [provider]  Insulin Glargine (BASAGLAR KWIKPEN) 100 UNIT/ML SOPN INJECT 40 UNITS SUBCUTANEOUSLY INTO THE SKIN AT BEDTIME 04/30/18   Renato Gails, Tiffany L, DO  LIFESCAN FINEPOINT LANCETS MISC Use to test for blood sugar three times daily dx: E11.51 06/01/18   [name], NP  metFORMIN (GLUCOPHAGE) 500 MG tablet Take 1 tablet (500 mg total) by mouth 2 (two) times daily with a meal. 03/17/19   Reed, Tiffany L, DO  nitroGLYCERIN (NITROSTAT) 0.4 MG SL tablet PLACE 1 TABLET (0.4 MG TOTAL) UNDER THE TONGUE EVERY 5 (FIVE) MINUTES AS NEEDED FOR CHEST PAIN. 06/23/17   Reed, Tiffany L, DO  ondansetron (ZOFRAN) 4 MG tablet Take 1 tablet (4 mg total) by mouth every 8 (eight) hours as needed for nausea or vomiting. 04/12/19   Ngetich, Donalee Citrin, NP  ONETOUCH ULTRA test strip USE TO TEST FOR BLOOD SUGAR THREE TIMES DAILY DX: E11.51 12/04/18   Reed, Tiffany L, DO  pantoprazole (PROTONIX) 40 MG tablet Take 40 mg by mouth daily.    [provider]  pramipexole (MIRAPEX) 1 MG tablet TAKE 2 TABLETS (2 MG TOTAL) BY MOUTH AT BEDTIME. DX G25.81 03/26/19   Reed, Tiffany L, DO  valsartan (DIOVAN) 80 MG tablet Take 1 tablet (80 mg total) by mouth daily. 03/17/19   [name], MD    ALLERGIES:  Allergies  Allergen Reactions  . Lyrica [Pregabalin] Other (See Comments)    Made patient very lethargic the next morning, very hard to patient to function, move, etc.   . Statins Other (See Comments)    Causes Restless Legs, rhabdomyolysis  . Metformin And Related     GI upset  . Trulicity [Dulaglutide] Other (See Comments)    GI side effects     SOCIAL HISTORY:  Social History   Tobacco Use  . Smoking status: Never Smoker  . Smokeless tobacco: Never Used  Substance Use  Topics  . Alcohol use: No    FAMILY HISTORY: Family History  Adopted: Yes  Family history unknown: Yes    EXAM: BP 123/64 (BP Location: Right Arm)   Pulse 63   Temp 99.5 F (37.5 C) (Oral)   Resp (!) 28   SpO2 95%  CONSTITUTIONAL: Alert and oriented and responds appropriately to questions.  Appears anxious. HEAD: Normocephalic EYES: Conjunctivae clear, pupils appear equal, EOM appear intact ENT: normal nose; moist mucous membranes NECK: Supple, normal ROM CARD:  RRR; S1 and S2 appreciated; no murmurs, no clicks, no rubs, no gallops RESP: Patient is intermittently tachypneic.  Breath sounds clear and equal bilaterally; no wheezes, no rhonchi, no rales, no hypoxia or respiratory distress, speaking full sentences ABD/GI: Normal bowel sounds; non-distended; soft, non-tender, no rebound, no guarding, no peritoneal signs, no hepatosplenomegaly BACK:  The back appears normal EXT: Normal ROM in all joints; no deformity noted, no edema; no cyanosis, no calf swelling or tenderness on exam SKIN: Normal color for age and race; warm; no rash on exposed skin NEURO: Moves all extremities equally PSYCH: The patient's mood and manner are appropriate.   MEDICAL DECISION MAKING: Patient here with increasing shortness of breath.  Tested positive for COVID-19 on February 5 that was symptomatic starting January 17.  Labs obtained in triage are unremarkable including negative troponin.  Chest x-ray shows multifocal pneumonia consistent with Covid pneumonia.  Will ambulate with pulse oximeter.  EKG shows no ischemic abnormality.  Sats 97% on room air at rest but he is mildly tachypneic.  ED PROGRESS: Patient did not have any hypoxia with ambulation but at rest his sats did drop to 88 to 89% and stayed there with good waveform for at least a minute.  Placed on 2 L nasal cannula and doing well.  Will discuss with medicine for admission.   6:39 AM Discussed patient's case with hospitalist, Dr. Toniann Fail.  I  have recommended admission and patient (and family if present) agree with this plan. Admitting physician will place admission orders.   Asked for confirmatory Covid test to be obtained here given we cannot see the results from his PCP on 2/5.  It appears per his records he had a normal chest x-ray on 2/4.  I reviewed all nursing notes, vitals, pertinent previous records and interpreted all EKGs, lab and urine results, imaging (as available).     EKG Interpretation  Date/Time:  Wednesday April 14 2019 05:03:52 EST Ventricular Rate:  60 PR Interval:  124 QRS Duration: 107 QT Interval:  442 QTC Calculation: 442 R Axis:   -14 Text Interpretation: Sinus rhythm Low voltage, precordial leads RSR' in V1 or V2, right VCD or RVH No significant change since last tracing Confirmed by Sokhna Christoph, Baxter Hire [phone]) on 04/14/2019 5:08:26 AM        CRITICAL CARE Performed by: Baxter Hire Tatem Holsonback   Total critical care time: 55 minutes  Critical care time was exclusive of separately billable procedures and treating other patients.  Critical care was necessary to treat or prevent imminent or life-threatening deterioration.  Critical care was time spent personally by me on the following activities: development of treatment plan with patient and/or surrogate as well as nursing, discussions with consultants, evaluation of patient's response to treatment, examination of patient, obtaining history from patient or surrogate, ordering and performing treatments and interventions, ordering and review of laboratory studies, ordering and review of radiographic studies, pulse oximetry and re-evaluation of patient's condition.   Kenneth Anguiano Sr. was evaluated in Emergency Department on 04/14/2019 for the symptoms described in the history of present illness. He was evaluated in the context of the global COVID-19 pandemic, which necessitated consideration that the patient might be at risk for infection with the SARS-CoV-2 virus  that causes COVID-19. Institutional protocols and algorithms that pertain to the evaluation of patients at risk for COVID-19 are in a state of rapid change based on information released by regulatory bodies including the CDC and federal and state organizations. These policies and algorithms were followed  during the patient's care in the ED.  Patient was seen wearing N95, face shield, gloves, gown.    Johney Perotti, Layla Maw, DO 04/14/19 [phone]

## 2019-04-14 NOTE — ED Triage Notes (Signed)
Patient tested positive for COVID19 Friday last week , reports persistent dry cough with SOB and chest tightness onset last week , denies fever or chills .

## 2019-04-14 NOTE — ED Notes (Addendum)
PT oxygen while walking went from 99 to 97.

## 2019-04-14 NOTE — ED Notes (Addendum)
ED TO INPATIENT HANDOFF REPORT  ED Nurse Name and Phone #:  [phone]  S Name/Age/Gender Kenneth Sic Sr. 69 y.o. male Room/Bed: 028C/028C  Code Status   Code Status: Full Code  Home/SNF/Other Home Patient oriented to: self, place, time and situation Is this baseline? Yes   Triage Complete: Triage complete  Chief Complaint Acute respiratory failure with hypoxia (HCC) [J96.01]  Triage Note Patient tested positive for COVID19 Friday last week , reports persistent dry cough with SOB and chest tightness onset last week , denies fever or chills .     Allergies Allergies  Allergen Reactions  . Lyrica [Pregabalin] Other (See Comments)    Made patient very lethargic the next morning, very hard to patient to function, move, etc.   . Statins Other (See Comments)    Causes Restless Legs, rhabdomyolysis  . Metformin And Related     GI upset  . Trulicity [Dulaglutide] Other (See Comments)    GI side effects     Level of Care/Admitting Diagnosis ED Disposition    ED Disposition Condition Comment   Admit  Hospital Area: [facility] [[id number]]  Level of Care: Telemetry Medical [104]  May admit patient to [facility] or [facility] if equivalent level of care is available:: No  Covid Evaluation: Symptomatic Person Under Investigation (PUI)  Diagnosis: Acute respiratory failure with hypoxia [facility]) [[id number]]  Admitting Physician: Eduard Clos [phone]  Attending Physician: Eduard Clos [phone]  Estimated length of stay: past midnight tomorrow  Certification:: I certify this patient will need inpatient services for at least 2 midnights       B Medical/Surgery History Past Medical History:  Diagnosis Date  . Arthritis    "left thumb" (05/23/2017)  . Barrett's esophagus    "we've been told that it's all gone; still take RX for GERD" (05/23/2017)  . CAD (coronary artery disease), native coronary artery    Coronary angiogram 05/03/2014:  Proximal LAD 3.0x12  mm Promus premier stent.  04/08/2014: Mid Cx 3.5 x 16 mm Promus DES.  03/29/2013: Mid LAD 2.75 x 38 mm Promus Premier drug-eluting stent, balloon angioplasty of D1 and distal LAD and stenting of distal RCA with 2.75 x 24 mm promos Premier drug-eluting stent 12/15/2012 widely patent.  . Cervicalgia   . Chronic bronchitis (HCC)    "q yr" (05/23/2017)  . Complication of anesthesia    " I do not wake up very well "  . COVID-19   . Family history of adverse reaction to anesthesia    "son w/PONV"  . GERD (gastroesophageal reflux disease)   . Heart murmur    "grew out of it"   . Hyperlipidemia   . Hypoglycemia, unspecified   . IDDM (insulin dependent diabetes mellitus)   . Impotence of organic origin   . Iron deficiency anemia   . Other malaise and fatigue   . PONV (postoperative nausea and vomiting)   . Restless leg   . Tension headache    "sometimes" (05/23/2017)  . Unspecified gastritis and gastroduodenitis with hemorrhage   . Unstable angina pectoris (HCC) 04/08/2014   Coronary angiogram 05/03/2014:  Proximal LAD 3.0x12 mm Promus premier stent.  04/08/2014: Mid Cx 3.5 x 16 mm Promus DES.  03/29/2013: Mid LAD 2.75 x 38 mm Promus Premier drug-eluting stent, balloon angioplasty of D1 and distal LAD and stenting of distal RCA with 2.75 x 24 mm promos Premier drug-eluting stent 12/15/2012 widely patent.   Past Surgical History:  Procedure Laterality Date  .  CARDIAC CATHETERIZATION N/A 09/25/2015   Procedure: Left Heart Cath and Coronary Angiography;  Surgeon: [name], MD;  Location: [facility] INVASIVE CV LAB;  Service: Cardiovascular;  Laterality: N/A;  . CARDIAC CATHETERIZATION N/A 09/25/2015   Procedure: Intravascular Pressure Wire/FFR Study;  Surgeon: [name], MD;  Location: [facility] INVASIVE CV LAB;  Service: Cardiovascular;  Laterality: N/A;  . CORONARY ANGIOPLASTY WITH STENT PLACEMENT  12/15/2012; 04/28/2014; 05/03/2014   "2; 1; 1"   . FRACTIONAL FLOW RESERVE WIRE Right 03/26/2013   Procedure: FRACTIONAL FLOW  RESERVE WIRE;  Surgeon: Pamella Pert, MD;  Location: [facility] CATH LAB;  Service: Cardiovascular;  Laterality: Right;  . FRACTIONAL FLOW RESERVE WIRE N/A 05/03/2014   Procedure: FRACTIONAL FLOW RESERVE WIRE;  Surgeon: Pamella Pert, MD;  Location: [facility] CATH LAB;  Service: Cardiovascular;  Laterality: N/A;  . HERNIA REPAIR  2010   "umbilical"   . INCISION AND DRAINAGE ABSCESS Left 05/2007   "groin"   . KNEE SURGERY Right 1992;  2000   "calcium deposits removed" (12/15/2012)  . LEFT HEART CATH AND CORONARY ANGIOGRAPHY N/A 05/23/2017   Procedure: LEFT HEART CATH AND CORONARY ANGIOGRAPHY;  Surgeon: [name], MD;  Location: [facility] INVASIVE CV LAB;  Service: Cardiovascular;  Laterality: N/A;  . LEFT HEART CATHETERIZATION WITH CORONARY ANGIOGRAM N/A 12/15/2012   Procedure: LEFT HEART CATHETERIZATION WITH CORONARY ANGIOGRAM;  Surgeon: Pamella Pert, MD;  Location: [facility] CATH LAB;  Service: Cardiovascular;  Laterality: N/A;  . LEFT HEART CATHETERIZATION WITH CORONARY ANGIOGRAM N/A 03/26/2013   Procedure: LEFT HEART CATHETERIZATION WITH CORONARY ANGIOGRAM;  Surgeon: Pamella Pert, MD;  Location: [facility] CATH LAB;  Service: Cardiovascular;  Laterality: N/A;  . LEFT HEART CATHETERIZATION WITH CORONARY ANGIOGRAM N/A 04/08/2014   Procedure: LEFT HEART CATHETERIZATION WITH CORONARY ANGIOGRAM;  Surgeon: Micheline Chapman, MD;  Location: [facility] CATH LAB;  Service: Cardiovascular;  Laterality: N/A;  . PERCUTANEOUS CORONARY STENT INTERVENTION (PCI-S) N/A 05/03/2014   Procedure: PERCUTANEOUS CORONARY STENT INTERVENTION (PCI-S);  Surgeon: Pamella Pert, MD;  Location: [facility] CATH LAB;  Service: Cardiovascular;  Laterality: N/A;  . UMBILICAL HERNIA REPAIR       A IV Location/Drains/Wounds Patient Lines/Drains/Airways Status   Active Line/Drains/Airways    Name:   Placement date:   Placement time:   Site:   Days:   Peripheral IV 04/14/19 Right Forearm   04/14/19    0557    Forearm   less than 1   Peripheral IV 04/14/19 Left  Antecubital   04/14/19    0557    Antecubital   less than 1          Intake/Output Last 24 hours No intake or output data in the 24 hours ending 04/14/19 0865  Labs/Imaging Results for orders placed or performed during the hospital encounter of 04/14/19 (from the past 48 hour(s))  Basic metabolic panel     Status: Abnormal   Collection Time: 04/14/19  3:58 AM  Result Value Ref Range   Sodium 133 (L) 135 - 145 mmol/L   Potassium 3.4 (L) 3.5 - 5.1 mmol/L   Chloride 97 (L) 98 - 111 mmol/L   CO2 25 22 - 32 mmol/L   Glucose, Bld 112 (H) 70 - 99 mg/dL   BUN 16 8 - 23 mg/dL   Creatinine, Ser 7.84 0.61 - 1.24 mg/dL   Calcium 8.4 (L) 8.9 - 10.3 mg/dL   GFR calc non Af Amer >60 >60 mL/min   GFR calc Af Amer >60 >60 mL/min  Anion gap 11 5 - 15    Comment: Performed at [facility] Lab, [address] [place], [place] [address]  CBC     Status: None   Collection Time: 04/14/19  3:58 AM  Result Value Ref Range   WBC 6.5 4.0 - 10.5 K/uL   RBC 4.93 4.22 - 5.81 MIL/uL   Hemoglobin 14.8 13.0 - 17.0 g/dL   HCT 22.0 25.4 - 27.0 %   MCV 88.0 80.0 - 100.0 fL   MCH 30.0 26.0 - 34.0 pg   MCHC 34.1 30.0 - 36.0 g/dL   RDW 62.3 76.2 - 83.1 %   Platelets 164 150 - 400 K/uL   nRBC 0.0 0.0 - 0.2 %    Comment: Performed at [facility] Lab, [address], [place], [place] [address]  Troponin I (High Sensitivity)     Status: None   Collection Time: 04/14/19  3:58 AM  Result Value Ref Range   Troponin I (High Sensitivity) 16 <18 ng/L    Comment: (NOTE) Elevated high sensitivity troponin I (hsTnI) values and significant  changes across serial measurements may suggest ACS but many other  chronic and acute conditions are known to elevate hsTnI results.  Refer to the "Links" section for chest pain algorithms and additional  guidance. Performed at [facility] Lab, [address], [place], [place] [address]   Lactic acid, plasma     Status: None   Collection Time: 04/14/19  5:40 AM  Result  Value Ref Range   Lactic Acid, Venous 1.2 0.5 - 1.9 mmol/L    Comment: Performed at [facility] Lab, [address], [place], [place] [address]  D-dimer, quantitative     Status: Abnormal   Collection Time: 04/14/19  5:40 AM  Result Value Ref Range   D-Dimer, Quant 1.22 (H) 0.00 - 0.50 ug/mL-FEU    Comment: (NOTE) At the manufacturer cut-off of 0.50 ug/mL FEU, this assay has been documented to exclude PE with a sensitivity and negative predictive value of 97 to 99%.  At this time, this assay has not been approved by the FDA to exclude DVT/VTE. Results should be correlated with clinical presentation. Performed at [facility] Lab, [address], [place], [place] [address]   Lactate dehydrogenase     Status: Abnormal   Collection Time: 04/14/19  5:40 AM  Result Value Ref Range   LDH 275 (H) 98 - 192 U/L    Comment: Performed at [facility] Lab, [address], [place], [place] [address]  Ferritin     Status: Abnormal   Collection Time: 04/14/19  5:40 AM  Result Value Ref Range   Ferritin 614 (H) 24 - 336 ng/mL    Comment: Performed at [facility] Lab, [address], [place], [place] [address]  Triglycerides     Status: None   Collection Time: 04/14/19  5:40 AM  Result Value Ref Range   Triglycerides 41 <150 mg/dL    Comment: Performed at [facility] Lab, [address], [place], [place] [address]  Fibrinogen     Status: Abnormal   Collection Time: 04/14/19  5:40 AM  Result Value Ref Range   Fibrinogen 688 (H) 210 - 475 mg/dL    Comment: Performed at [facility] Lab, [address] [place], [place] [address]  C-reactive protein     Status: Abnormal   Collection Time: 04/14/19  5:40 AM  Result Value Ref Range  CRP 13.6 (H) <1.0 mg/dL    Comment: Performed at [facility] Lab, [address], [place], [place] [address]  Brain natriuretic peptide     Status: None   Collection Time: 04/14/19  5:40 AM  Result Value Ref Range   B Natriuretic Peptide 83.0 0.0  - 100.0 pg/mL    Comment: Performed at [facility] Lab, [address] [place], [place] [address]  Hepatic function panel     Status: Abnormal   Collection Time: 04/14/19  5:40 AM  Result Value Ref Range   Total Protein 6.6 6.5 - 8.1 g/dL   Albumin 2.9 (L) 3.5 - 5.0 g/dL   AST 26 15 - 41 U/L   ALT 24 0 - 44 U/L   Alkaline Phosphatase 57 38 - 126 U/L   Total Bilirubin 1.4 (H) 0.3 - 1.2 mg/dL   Bilirubin, Direct 0.3 (H) 0.0 - 0.2 mg/dL   Indirect Bilirubin 1.1 (H) 0.3 - 0.9 mg/dL    Comment: Performed at [facility] Lab, [address] [place], [place] [address]  Troponin I (High Sensitivity)     Status: None   Collection Time: 04/14/19  5:40 AM  Result Value Ref Range   Troponin I (High Sensitivity) 14 <18 ng/L    Comment: (NOTE) Elevated high sensitivity troponin I (hsTnI) values and significant  changes across serial measurements may suggest ACS but many other  chronic and acute conditions are known to elevate hsTnI results.  Refer to the "Links" section for chest pain algorithms and additional  guidance. Performed at [facility] Lab, [address] [place], [place] [address]   Respiratory Panel by RT PCR (Flu A&B, Covid) - Nasopharyngeal Swab     Status: Abnormal   Collection Time: 04/14/19  6:45 AM   Specimen: Nasopharyngeal Swab  Result Value Ref Range   SARS Coronavirus 2 by RT PCR POSITIVE (A) NEGATIVE    Comment: RESULT CALLED TO, READ BACK BY AND VERIFIED WITH: Edelyn Heidel RN ON [id number] AT 0846 BY NFIELDS (NOTE) SARS-CoV-2 target nucleic acids are DETECTED. SARS-CoV-2 RNA is generally detectable in upper respiratory specimens  during the acute phase of infection. Positive results are indicative of the presence of the identified virus, but do not rule out bacterial infection or co-infection with other pathogens not detected by the test. Clinical correlation with patient history and other diagnostic information is necessary to determine patient infection  status. The expected result is Negative. Fact Sheet for Patients:  https://www.moore.com/ Fact Sheet for Healthcare Providers: https://www.young.biz/ This test is not yet approved or cleared by the [place] FDA and  has been authorized for detection and/or diagnosis of SARS-CoV-2 by FDA under an Emergency Use Authorization (EUA).  This EUA will remain in effect (meaning this test can be [place] ed) for the duration of  the COVID-19 declaration under Section 564(b)(1) of the Act, 21 U.S.C. section 360bbb-3(b)(1), unless the authorization is terminated or revoked sooner.    Influenza A by PCR NEGATIVE NEGATIVE   Influenza B by PCR NEGATIVE NEGATIVE    Comment: (NOTE) The Xpert Xpress SARS-CoV-2/FLU/RSV assay is intended as an aid in  the diagnosis of influenza from Nasopharyngeal swab specimens and  should not be used as a sole basis for treatment. Nasal washings and  aspirates are unacceptable for Xpert Xpress SARS-CoV-2/FLU/RSV  testing. Fact Sheet for Patients: https://www.moore.com/ Fact Sheet for Healthcare Providers: https://www.young.biz/ This test is not yet approved or cleared by the [place] and  has been authorized for detection and/or diagnosis of SARS-CoV-2 by  FDA under an Emergency Use Authorization (EUA). This EUA will remain  in effect (meaning this test can be used) for the duration of the  Covid-19 declaration under Section 564(b)(1) of the Act, 21  U.S.C. section 360bbb-3(b)(1), unless the authorization is  terminated or revoked. Performed at [facility], [address] [place], [place] [address]   CBG monitoring, ED     Status: Abnormal   Collection Time: 04/14/19  8:32 AM  Result Value Ref Range   Glucose-Capillary 101 (H) 70 - 99 mg/dL   DG Chest Portable 1 View  Result Date: 04/14/2019 CLINICAL DATA:  Chest tightness, COVID-19 positive EXAM: PORTABLE CHEST 1  VIEW COMPARISON:  Radiograph 09/18/2015 FINDINGS: Multifocal min areas of patchy airspace opacity present throughout both lungs. No pneumothorax or effusion. The cardiomediastinal contours are unremarkable. No acute osseous or soft tissue abnormality. Mild degenerative changes in the shoulders. Cardiac monitoring leads overlie the chest. IMPRESSION: Multifocal airspace disease compatible with atypical pneumonia in the setting of COVID-19 positivity. Electronically Signed   By: Kreg ShropshirePrice  DeHay M.D.   On: 04/14/2019 05:13    Pending Labs Unresulted Labs (From admission, onward)    Start     Ordered   04/15/19 0500  CBC with Differential/Platelet  Daily,   R     04/14/19 0717   04/15/19 0500  Comprehensive metabolic panel  Daily,   R     04/14/19 0717   04/15/19 0500  C-reactive protein  Daily,   R     04/14/19 0717   04/15/19 0500  D-dimer, quantitative (not at [facility])  Daily,   R     04/14/19 0717   04/15/19 0500  Ferritin  Daily,   R     04/14/19 0717   04/15/19 0500  Magnesium  Daily,   R     04/14/19 0717   04/15/19 0500  Phosphorus  Daily,   R     04/14/19 0717   04/14/19 0715  HIV Antibody (routine testing w rflx)  (HIV Antibody (Routine testing w reflex) panel)  Once,   STAT     04/14/19 0717   04/14/19 0526  Blood Culture (routine x 2)  BLOOD CULTURE X 2,   STAT     04/14/19 0526   04/14/19 0526  Procalcitonin  ONCE - STAT,   STAT     04/14/19 0526          Vitals/Pain Today's Vitals   04/14/19 0745 04/14/19 0800 04/14/19 0815 04/14/19 0821  BP: 116/67 111/76 118/68   Pulse: (!) 56 60 60 60  Resp: 18 (!) 28 18 17   Temp:      TempSrc:      SpO2: 98% 95% 99% 97%  PainSc:        Isolation Precautions Airborne and Contact precautions  Medications Medications  sodium chloride flush (NS) 0.9 % injection 3 mL (3 mLs Intravenous Not Given 04/14/19 0513)  remdesivir 200 mg in sodium chloride 0.9% 250 mL IVPB (0 mg Intravenous Stopped 04/14/19 0741)    Followed by  remdesivir  100 mg in sodium chloride 0.9 % 100 mL IVPB (has no administration in time range)  enoxaparin (LOVENOX) injection 40 mg (has no administration in time range)  sodium chloride flush (NS) 0.9 % injection 3 mL (has no administration in time range)  albuterol (VENTOLIN HFA) 108 (90 Base) MCG/ACT inhaler 2 puff (2 puffs Inhalation Given 04/14/19  0742)  dexamethasone (DECADRON) tablet 6 mg (has no administration in time range)  guaiFENesin-dextromethorphan (ROBITUSSIN DM) 100-10 MG/5ML syrup 10 mL (has no administration in time range)  chlorpheniramine-HYDROcodone (TUSSIONEX) 10-8 MG/5ML suspension 5 mL (has no administration in time range)  famotidine (PEPCID) tablet 20 mg (has no administration in time range)  acetaminophen (TYLENOL) tablet 650 mg (has no administration in time range)  ondansetron (ZOFRAN) tablet 4 mg (has no administration in time range)    Or  ondansetron (ZOFRAN) injection 4 mg (has no administration in time range)  ascorbic acid (VITAMIN C) tablet 500 mg (has no administration in time range)  zinc sulfate capsule 220 mg (has no administration in time range)  0.9 %  sodium chloride infusion (has no administration in time range)  insulin aspart (novoLOG) injection 0-15 Units (0 Units Subcutaneous Not Given 04/14/19 0834)  aspirin EC tablet 81 mg (has no administration in time range)  carvedilol (COREG) tablet 6.25 mg (6.25 mg Oral Given 04/14/19 0834)  irbesartan (AVAPRO) tablet 75 mg (has no administration in time range)  Basaglar KwikPen KwikPen 40 Units (has no administration in time range)  gabapentin (NEURONTIN) capsule 200 mg (has no administration in time range)  pramipexole (MIRAPEX) tablet 2 mg (has no administration in time range)  fluticasone (FLONASE) 50 MCG/ACT nasal spray 1 spray (has no administration in time range)  acetaminophen (TYLENOL) tablet 1,000 mg (1,000 mg Oral Given 04/14/19 0555)  dexamethasone (DECADRON) injection 6 mg (6 mg Intravenous Given 04/14/19  0603)  potassium chloride (KLOR-CON) CR tablet 30 mEq (30 mEq Oral Given 04/14/19 0742)    Mobility walks Low fall risk   Focused Assessments Pulmonary Assessment Handoff:  Lung sounds: Bilateral Breath Sounds: Diminished O2 Device: Nasal Cannula O2 Flow Rate (L/min): 2 L/min      R Recommendations: See Admitting Provider Note  Report given to:   Additional Notes: Continuous pulse oximetry with nasal cannula oxygen to maintain O2 saturation greater than 90% Albuterol inhaler Continue remdesivir per pharmacy Continue Decadron 6 mg daily Antitussives as needed Vitamin C and zinc Continue to monitor inflammatory markers

## 2019-04-14 NOTE — Plan of Care (Signed)
Problem: Education: Goal: Knowledge of General Education information will improve Description: Including pain rating scale, medication(s)/side effects and non-pharmacologic comfort measures 04/14/2019 1106 by Maryruth Eve, RN Outcome: Progressing 04/14/2019 1106 by Maryruth Eve, RN Outcome: Progressing 04/14/2019 1106 by Maryruth Eve, RN Outcome: Progressing   Problem: Health Behavior/Discharge Planning: Goal: Ability to manage health-related needs will improve 04/14/2019 1106 by Maryruth Eve, RN Outcome: Progressing 04/14/2019 1106 by Maryruth Eve, RN Outcome: Progressing 04/14/2019 1106 by Maryruth Eve, RN Outcome: Progressing   Problem: Clinical Measurements: Goal: Ability to maintain clinical measurements within normal limits will improve 04/14/2019 1106 by Maryruth Eve, RN Outcome: Progressing 04/14/2019 1106 by Maryruth Eve, RN Outcome: Progressing 04/14/2019 1106 by Maryruth Eve, RN Outcome: Progressing Goal: Will remain free from infection 04/14/2019 1106 by Maryruth Eve, RN Outcome: Progressing 04/14/2019 1106 by Maryruth Eve, RN Outcome: Progressing 04/14/2019 1106 by Maryruth Eve, RN Outcome: Progressing Goal: Diagnostic test results will improve 04/14/2019 1106 by Maryruth Eve, RN Outcome: Progressing 04/14/2019 1106 by Maryruth Eve, RN Outcome: Progressing 04/14/2019 1106 by Maryruth Eve, RN Outcome: Progressing Goal: Respiratory complications will improve 04/14/2019 1106 by Maryruth Eve, RN Outcome: Progressing 04/14/2019 1106 by Maryruth Eve, RN Outcome: Progressing 04/14/2019 1106 by Maryruth Eve, RN Outcome: Progressing Goal: Cardiovascular complication will be avoided 04/14/2019 1106 by Maryruth Eve, RN Outcome: Progressing 04/14/2019 1106 by Maryruth Eve, RN Outcome: Progressing 04/14/2019 1106 by Maryruth Eve, RN Outcome: Progressing   Problem: Activity: Goal: Risk for activity intolerance will decrease 04/14/2019 1106 by Maryruth Eve, RN Outcome: Progressing 04/14/2019 1106 by Maryruth Eve, RN Outcome: Progressing 04/14/2019 1106 by Maryruth Eve, RN Outcome: Progressing   Problem: Nutrition: Goal: Adequate nutrition will be maintained 04/14/2019 1106 by Maryruth Eve, RN Outcome: Progressing 04/14/2019 1106 by Maryruth Eve, RN Outcome: Progressing 04/14/2019 1106 by Maryruth Eve, RN Outcome: Progressing   Problem: Coping: Goal: Level of anxiety will decrease 04/14/2019 1106 by Maryruth Eve, RN Outcome: Progressing 04/14/2019 1106 by Maryruth Eve, RN Outcome: Progressing 04/14/2019 1106 by Maryruth Eve, RN Outcome: Progressing   Problem: Elimination: Goal: Will not experience complications related to bowel motility 04/14/2019 1106 by Maryruth Eve, RN Outcome: Progressing 04/14/2019 1106 by Maryruth Eve, RN Outcome: Progressing 04/14/2019 1106 by Maryruth Eve, RN Outcome: Progressing Goal: Will not experience complications related to urinary retention 04/14/2019 1106 by Maryruth Eve, RN Outcome: Progressing 04/14/2019 1106 by Maryruth Eve, RN Outcome: Progressing 04/14/2019 1106 by Maryruth Eve, RN Outcome: Progressing   Problem: Pain Managment: Goal: General experience of comfort will improve 04/14/2019 1106 by Maryruth Eve, RN Outcome: Progressing 04/14/2019 1106 by Maryruth Eve, RN Outcome: Progressing 04/14/2019 1106 by Maryruth Eve, RN Outcome: Progressing   Problem: Safety: Goal: Ability to remain free from injury will improve 04/14/2019 1106 by Maryruth Eve, RN Outcome: Progressing 04/14/2019 1106 by Maryruth Eve, RN Outcome: Progressing 04/14/2019 1106 by Maryruth Eve, RN Outcome: Progressing    Problem: Skin Integrity: Goal: Risk for impaired skin integrity will decrease 04/14/2019 1106 by Maryruth Eve, RN Outcome: Progressing 04/14/2019 1106 by Maryruth Eve, RN Outcome: Progressing 04/14/2019 1106 by Maryruth Eve, RN Outcome: Progressing   Problem: Education: Goal: Knowledge of risk factors and measures for prevention of condition will improve 04/14/2019 1106 by Maryruth Eve, RN Outcome: Progressing 04/14/2019 1106  by Lacey Jensen, RN Outcome: Progressing 04/14/2019 1106 by Lacey Jensen, RN Outcome: Progressing   Problem: Coping: Goal: Psychosocial and spiritual needs will be supported 04/14/2019 1106 by Lacey Jensen, RN Outcome: Progressing 04/14/2019 1106 by Lacey Jensen, RN Outcome: Progressing 04/14/2019 1106 by Lacey Jensen, RN Outcome: Progressing   Problem: Respiratory: Goal: Will maintain a patent airway 04/14/2019 1106 by Lacey Jensen, RN Outcome: Progressing 04/14/2019 1106 by Lacey Jensen, RN Outcome: Progressing 04/14/2019 1106 by Lacey Jensen, RN Outcome: Progressing Goal: Complications related to the disease process, condition or treatment will be avoided or minimized 04/14/2019 1106 by Lacey Jensen, RN Outcome: Progressing 04/14/2019 1106 by Lacey Jensen, RN Outcome: Progressing 04/14/2019 1106 by Lacey Jensen, RN Outcome: Progressing

## 2019-04-14 NOTE — Progress Notes (Signed)
Patient c/o severe pain related to his restless leg. Per pt, when pt gets this bad, Mirapex does not help and he has to go to the ED for Dilaudid. Dr. Katrinka Blazing paged to notify. Orders placed for Dilaudid and administered with relief. Will continue to monitor.  Lyndal Pulley, RN 04/14/2019 1:48 PM

## 2019-04-14 NOTE — ED Notes (Signed)
Attempted report x1. 

## 2019-04-14 NOTE — ED Notes (Signed)
On room air while laying in bed, the patient's SPO2 read at 88% with good pleth. The patient was placed on 2 liters nasal cannula O2

## 2019-04-14 NOTE — ED Notes (Signed)
Ordered pt breakfast tray

## 2019-04-14 NOTE — ED Notes (Signed)
ED Provider at bedside. 

## 2019-04-14 NOTE — Progress Notes (Signed)
Kenneth Handel Sr. [id number] Admission Data: 04/14/2019 12:08 PM Attending Provider: Eduard Clos, MD  TEI:HDTP, Gwenith Spitz, DO  Kenneth Sic Sr. is a 68 y.o. male patient admitted from ED awake, alert  & orientated  X 3,  Full Code, VSS - Blood pressure 129/87, pulse (!) 53, temperature (!) 97.4 F (36.3 C), temperature source Oral, resp. rate (!) 23, SpO2 91 %., O2 No c/o shortness of breath, no c/o chest pain, no distress noted. Tele # [id number] placed and pt is currently running:sinus bradycardia.  Pt orientation to unit, room and routine. Information packet given to patient/family.  Admission INP armband ID verified with patient/family, and in place. SR up x 2, fall risk assessment complete with patient and family verbalizing understanding of risks associated with falls. Pt verbalizes an understanding of how to use the call bell and to call for help before getting out of bed.  Skin, clean-dry- intact without evidence of bruising, or skin tears.    Will continue to monitor and assist as needed.  Lyndal Pulley, RN 04/14/2019 10:32 AM

## 2019-04-14 NOTE — ED Notes (Signed)
Spoke with pharmacy about scheduling at bedtime 1mg  and 1mg  PRN.  Per pt, this is the schedule that works vest for him.

## 2019-04-15 LAB — GLUCOSE, CAPILLARY
Glucose-Capillary: 144 mg/dL — ABNORMAL HIGH (ref 70–99)
Glucose-Capillary: 144 mg/dL — ABNORMAL HIGH (ref 70–99)
Glucose-Capillary: 232 mg/dL — ABNORMAL HIGH (ref 70–99)
Glucose-Capillary: 296 mg/dL — ABNORMAL HIGH (ref 70–99)

## 2019-04-15 LAB — COMPREHENSIVE METABOLIC PANEL
ALT: 19 U/L (ref 0–44)
AST: 19 U/L (ref 15–41)
Albumin: 2.5 g/dL — ABNORMAL LOW (ref 3.5–5.0)
Alkaline Phosphatase: 54 U/L (ref 38–126)
Anion gap: 9 (ref 5–15)
BUN: 23 mg/dL (ref 8–23)
CO2: 23 mmol/L (ref 22–32)
Calcium: 8.4 mg/dL — ABNORMAL LOW (ref 8.9–10.3)
Chloride: 104 mmol/L (ref 98–111)
Creatinine, Ser: 1.12 mg/dL (ref 0.61–1.24)
GFR calc Af Amer: >60 mL/min (ref >60)
GFR calc non Af Amer: >60 mL/min (ref >60)
Glucose, Bld: 212 mg/dL — ABNORMAL HIGH (ref 70–99)
Potassium: 4.6 mmol/L (ref 3.5–5.1)
Sodium: 136 mmol/L (ref 135–145)
Total Bilirubin: 0.8 mg/dL (ref 0.3–1.2)
Total Protein: 6 g/dL — ABNORMAL LOW (ref 6.5–8.1)

## 2019-04-15 LAB — CBC WITH DIFFERENTIAL/PLATELET
Abs Immature Granulocytes: 0.06 10*3/uL (ref 0.00–0.07)
Basophils Absolute: 0 10*3/uL (ref 0.0–0.1)
Basophils Relative: 0 %
Eosinophils Absolute: 0.1 10*3/uL (ref 0.0–0.5)
Eosinophils Relative: 1 %
HCT: 39.4 % (ref 39.0–52.0)
Hemoglobin: 13.5 g/dL (ref 13.0–17.0)
Immature Granulocytes: 1 %
Lymphocytes Relative: 9 %
Lymphs Abs: 0.6 10*3/uL — ABNORMAL LOW (ref 0.7–4.0)
MCH: 30.1 pg (ref 26.0–34.0)
MCHC: 34.3 g/dL (ref 30.0–36.0)
MCV: 87.8 fL (ref 80.0–100.0)
Monocytes Absolute: 0.5 10*3/uL (ref 0.1–1.0)
Monocytes Relative: 7 %
Neutro Abs: 6.2 10*3/uL (ref 1.7–7.7)
Neutrophils Relative %: 82 %
Platelets: 167 10*3/uL (ref 150–400)
RBC: 4.49 MIL/uL (ref 4.22–5.81)
RDW: 11.9 % (ref 11.5–15.5)
WBC: 7.4 10*3/uL (ref 4.0–10.5)
nRBC: 0 % (ref 0.0–0.2)

## 2019-04-15 LAB — MAGNESIUM: Magnesium: 2.1 mg/dL (ref 1.7–2.4)

## 2019-04-15 LAB — PHOSPHORUS: Phosphorus: 2.5 mg/dL (ref 2.5–4.6)

## 2019-04-15 LAB — FERRITIN: Ferritin: 588 ng/mL — ABNORMAL HIGH (ref 24–336)

## 2019-04-15 LAB — D-DIMER, QUANTITATIVE: D-Dimer, Quant: 0.66 ug/mL-FEU — ABNORMAL HIGH (ref 0.00–0.50)

## 2019-04-15 LAB — C-REACTIVE PROTEIN: CRP: 12.4 mg/dL — ABNORMAL HIGH (ref <1.0)

## 2019-04-15 MED ORDER — LINAGLIPTIN 5 MG PO TABS
5.0000 mg | ORAL_TABLET | Freq: Every day | ORAL | Status: DC
  Administered 2019-04-15 – 2019-04-18 (×4): 5 mg via ORAL
  Filled 2019-04-15 (×4): qty 1

## 2019-04-15 MED ORDER — CARVEDILOL 3.125 MG PO TABS
3.1250 mg | ORAL_TABLET | Freq: Two times a day (BID) | ORAL | Status: DC
  Administered 2019-04-15 – 2019-04-18 (×6): 3.125 mg via ORAL
  Filled 2019-04-15 (×6): qty 1

## 2019-04-15 MED ORDER — INSULIN ASPART 100 UNIT/ML ~~LOC~~ SOLN
4.0000 [IU] | Freq: Three times a day (TID) | SUBCUTANEOUS | Status: DC
  Administered 2019-04-15 – 2019-04-18 (×9): 4 [IU] via SUBCUTANEOUS

## 2019-04-15 MED ORDER — INSULIN GLARGINE 100 UNIT/ML ~~LOC~~ SOLN
15.0000 [IU] | Freq: Every day | SUBCUTANEOUS | Status: DC
  Administered 2019-04-15 – 2019-04-18 (×4): 15 [IU] via SUBCUTANEOUS
  Filled 2019-04-15 (×4): qty 0.15

## 2019-04-15 MED ORDER — DEXAMETHASONE 6 MG PO TABS
10.0000 mg | ORAL_TABLET | ORAL | Status: DC
  Administered 2019-04-16 – 2019-04-18 (×3): 10 mg via ORAL
  Filled 2019-04-15 (×3): qty 1

## 2019-04-15 NOTE — Progress Notes (Signed)
Notified Craige Cotta, NP that pt's HR is 40's-50s. Pt is asymptomatic. No new orders given. Notified telemetry to set HR parameter to 40 due to pt not being able to sleep due to monitor ringing in room. Will continue to monitor pt. Nelda Marseille, RN

## 2019-04-15 NOTE — Progress Notes (Signed)
PROGRESS NOTE                                                                                                                                                                                                             Patient Demographics:    [name], is a 69 y.o. male, DOB - [birth year], QIW:[id number]  Admit date - 04/14/2019   Admitting Physician Eduard Clos, MD  Outpatient Primary MD for the patient is Kermit Balo, DO  LOS - 1   Chief Complaint  Patient presents with  . COVID+ / SOB/Cough       Brief Narrative    69 y.o. male with medical history significant of  hypertension, diabetes mellitus, CAD, GERD, presents with shortness of breath, diagnosed with COVID-19 February 5,  He received infusion of casirivimab\imdevimab 3 days ago.  Patient presents secondary to complaints of chest pain and shortness of breath, he was noted to be hypoxic, and was admitted for further treatment.   Subjective:    [name] today ports his dyspnea has improved, denies any chest pain, reports cough.    Assessment  & Plan :    Principal Problem:   Acute respiratory failure with hypoxia (HCC) Active Problems:   Controlled type 2 DM with peripheral circulatory disorder (HCC)   Essential hypertension   Restless legs syndrome (RLS)   CAD (coronary artery disease), native coronary artery   Pneumonia due to COVID-19 virus   Hypokalemia   Diabetes mellitus type 2, uncontrolled (HCC)   GERD (gastroesophageal reflux disease)   Acute respiratory failure with hypoxia secondary to pneumonia due to COVID-19:  -Presents with dyspnea, Covid +2/5, chest x-ray significant for multifocal pneumonia, requiring 2 L nasal cannula presentation is hypoxic 88% on room air . -Continue with IV steroids . -Continue with IV remdesivir . -He received infusion of casirivimab\imdevimab 2/7. -He was encouraged to use incentive spirometry, flutter valve, to chair, and  to prone once in bed . -Antitussives as needed -Vitamin C and zinc -Continue to monitor inflammatory markers  COVID-19 Labs  Recent Labs    04/14/19 0540 04/15/19 0434  DDIMER 1.22* 0.66*  FERRITIN 614* 588*  LDH 275*  --   CRP 13.6* 12.4*    Lab Results  Component Value Date   SARSCOV2NAA POSITIVE (A) 04/14/2019   SARSCOV2NAA Not Detected  01/22/2019      Chest pain:  -  EKG and troponins unremarkable any signs of ischemia.  Secondary to musculoskeletal chest pain from cough -Continue to monitor  Hypokalemia:  - Repleted  Essential hypertension: -  Medications include Coreg 6.25 mg and valsartan 80 mg.  Patient noted to have heart rates 47-62. - continue home medications with holding parameters for HR < 55(decrease Coreg dose by half)  Diabetes mellitus type 2(uncontrolled): - last hemoglobin A1c 9.2 on 03/09/2019. Home medications include Metformin 500 mg twice daily and glargine 40 units qhs. -Hold Metformin. -CBG remains uncontrolled, will add Lantus 15 units daily,. -We will start Tradjenta  CAD: Patient with previous history of stents. -Continue aspirin -Patient with some bradycardia, will decrease his Coreg dose by half.  GERD/Barrett's esophagus: Home medications Protonix 40 mg daily -Pepcid twice daily  Restless leg syndrome -Continue current home medication regimen  Code Status : Full  Family Communication  : Wife ad son by phone 2/11  Disposition Plan  : Home   Barriers For Discharge : Home, when more stable, remains on IV  Remdesivir and O2.  Consults  :  None  Procedures : None  DVT Prophylaxis  :  [place] lovenox  Lab Results  Component Value Date   PLT 167 04/15/2019    Antibiotics  :    Anti-infectives (From admission, onward)   Start     Dose/Rate Route Frequency Ordered Stop   04/15/19 1000  remdesivir 100 mg in sodium chloride 0.9 % 100 mL IVPB     100 mg 200 mL/hr over 30 Minutes Intravenous Daily 04/14/19 0623 04/19/19  0959   04/14/19 0700  remdesivir 200 mg in sodium chloride 0.9% 250 mL IVPB     200 mg 580 mL/hr over 30 Minutes Intravenous Once 04/14/19 0623 04/14/19 0741        Objective:   Vitals:   04/14/19 2145 04/15/19 0600 04/15/19 0800 04/15/19 0900  BP: 119/66 114/76 120/68   Pulse: (!) 56 (!) 53 (!) 48   Resp: 20 (!) 25 17   Temp: 97.9 F (36.6 C) 97.6 F (36.4 C) 97.7 F (36.5 C)   TempSrc: Oral Oral Oral   SpO2: 94% 95% 98% 91%    Wt Readings from Last 3 Encounters:  03/15/19 86.2 kg  03/02/19 83 kg  12/24/18 83 kg     Intake/Output Summary (Last 24 hours) at 04/15/2019 1150 Last data filed at 04/14/2019 2236 Gross per 24 hour  Intake 743 ml  Output --  Net 743 ml     Physical Exam  Awake Alert, Oriented X 3, No new F.N deficits, Normal affect Symmetrical Chest wall movement, Good air movement bilaterally, CTAB RRR,No Gallops,Rubs or new Murmurs, No Parasternal Heave +ve B.Sounds, Abd Soft, No tenderness,  No rebound - guarding or rigidity. No Cyanosis, Clubbing or edema, No new Rash or bruise     Data Review:    CBC Recent Labs  Lab 04/14/19 0358 04/15/19 0434  WBC 6.5 7.4  HGB 14.8 13.5  HCT 43.4 39.4  PLT 164 167  MCV 88.0 87.8  MCH 30.0 30.1  MCHC 34.1 34.3  RDW 11.9 11.9  LYMPHSABS  --  0.6*  MONOABS  --  0.5  EOSABS  --  0.1  BASOSABS  --  0.0    Chemistries  Recent Labs  Lab 04/14/19 0358 04/14/19 0540 04/15/19 0434  NA 133*  --  136  K 3.4*  --  4.6  CL 97*  --  104  CO2 25  --  23  GLUCOSE 112*  --  212*  BUN 16  --  23  CREATININE 1.11  --  1.12  CALCIUM 8.4*  --  8.4*  MG  --   --  2.1  AST  --  26 19  ALT  --  24 19  ALKPHOS  --  57 54  BILITOT  --  1.4* 0.8   ------------------------------------------------------------------------------------------------------------------ Recent Labs    04/14/19 0540  TRIG 41    Lab Results  Component Value Date   HGBA1C 9.2 (H) 03/09/2019    ------------------------------------------------------------------------------------------------------------------ No results for input(s): TSH, T4TOTAL, T3FREE, THYROIDAB in the last 72 hours.  Invalid input(s): FREET3 ------------------------------------------------------------------------------------------------------------------ Recent Labs    04/14/19 0540 04/15/19 0434  FERRITIN 614* 588*    Coagulation profile No results for input(s): INR, PROTIME in the last 168 hours.  Recent Labs    04/14/19 0540 04/15/19 0434  DDIMER 1.22* 0.66*    Cardiac Enzymes No results for input(s): CKMB, TROPONINI, MYOGLOBIN in the last 168 hours.  Invalid input(s): CK ------------------------------------------------------------------------------------------------------------------    Component Value Date/Time   BNP 83.0 04/14/2019 0540    Inpatient Medications  Scheduled Meds: . albuterol  2 puff Inhalation TID  . vitamin C  500 mg Oral Daily  . aspirin EC  81 mg Oral Daily  . carvedilol  6.25 mg Oral BID WC  . dexamethasone  6 mg Oral Q24H  . enoxaparin (LOVENOX) injection  40 mg Subcutaneous Q24H  . famotidine  20 mg Oral BID  . gabapentin  200 mg Oral QPC supper  . insulin aspart  0-15 Units Subcutaneous TID WC  . insulin glargine  15 Units Subcutaneous Daily  . insulin glargine  40 Units Subcutaneous QHS  . irbesartan  75 mg Oral Daily  . pramipexole  1 mg Oral QHS  . sodium chloride flush  3 mL Intravenous Once  . sodium chloride flush  3 mL Intravenous Q12H  . zinc sulfate  220 mg Oral Daily   Continuous Infusions: . remdesivir 100 mg in NS 100 mL 100 mg (04/15/19 0938)   PRN Meds:.acetaminophen, chlorpheniramine-HYDROcodone, fluticasone, guaiFENesin-dextromethorphan, HYDROmorphone (DILAUDID) injection, ondansetron **OR** ondansetron (ZOFRAN) IV, pramipexole  Micro Results Recent Results (from the past 240 hour(s))  Respiratory Panel by RT PCR (Flu A&B, Covid) -  Nasopharyngeal Swab     Status: Abnormal   Collection Time: 04/14/19  6:45 AM   Specimen: Nasopharyngeal Swab  Result Value Ref Range Status   SARS Coronavirus 2 by RT PCR POSITIVE (A) NEGATIVE Final    Comment: RESULT CALLED TO, READ BACK BY AND VERIFIED WITH: MARSHALL RN ON [id number] AT 0846 BY NFIELDS (NOTE) SARS-CoV-2 target nucleic acids are DETECTED. SARS-CoV-2 RNA is generally detectable in upper respiratory specimens  during the acute phase of infection. Positive results are indicative of the presence of the identified virus, but do not rule out bacterial infection or co-infection with other pathogens not detected by the test. Clinical correlation with patient history and other diagnostic information is necessary to determine patient infection status. The expected result is Negative. Fact Sheet for Patients:  https://www.moore.com/ Fact Sheet for Healthcare Providers: https://www.young.biz/ This test is not yet approved or cleared by the [place] FDA and  has been authorized for detection and/or diagnosis of SARS-CoV-2 by FDA under an Emergency Use Authorization (EUA).  This EUA will remain in effect (meaning this test can be [place] ed) for the  duration of  the COVID-19 declaration under Section 564(b)(1) of the Act, 21 U.S.C. section 360bbb-3(b)(1), unless the authorization is terminated or revoked sooner.    Influenza A by PCR NEGATIVE NEGATIVE Final   Influenza B by PCR NEGATIVE NEGATIVE Final    Comment: (NOTE) The Xpert Xpress SARS-CoV-2/FLU/RSV assay is intended as an aid in  the diagnosis of influenza from Nasopharyngeal swab specimens and  should not be used as a sole basis for treatment. Nasal washings and  aspirates are unacceptable for Xpert Xpress SARS-CoV-2/FLU/RSV  testing. Fact Sheet for Patients: PinkCheek.be Fact Sheet for Healthcare  Providers: GravelBags.it This test is not yet approved or cleared by the [place] FDA and  has been authorized for detection and/or diagnosis of SARS-CoV-2 by  FDA under an Emergency Use Authorization (EUA). This EUA will remain  in effect (meaning this test can be used) for the duration of the  Covid-19 declaration under Section 564(b)(1) of the Act, 21  U.S.C. section 360bbb-3(b)(1), unless the authorization is  terminated or revoked. Performed at [facility], [place] [address], [place], [place] [address]     Radiology Reports DG Chest 2 View  Result Date: 04/08/2019 CLINICAL DATA:  Nonproductive cough for 6 months. EXAM: CHEST - 2 VIEW COMPARISON:  09/18/2015 from [place]: The heart size and mediastinal contours are within normal limits. Both lungs are clear. The visualized skeletal structures are unremarkable. IMPRESSION: No active cardiopulmonary disease. Electronically Signed   By: Marlaine Hind M.D.   On: 04/08/2019 15:28   DG Chest Portable 1 View  Result Date: 04/14/2019 CLINICAL DATA:  Chest tightness, COVID-19 positive EXAM: PORTABLE CHEST 1 VIEW COMPARISON:  Radiograph 09/18/2015 FINDINGS: Multifocal min areas of patchy airspace opacity present throughout both lungs. No pneumothorax or effusion. The cardiomediastinal contours are unremarkable. No acute osseous or soft tissue abnormality. Mild degenerative changes in the shoulders. Cardiac monitoring leads overlie the chest. IMPRESSION: Multifocal airspace disease compatible with atypical pneumonia in the setting of COVID-19 positivity. Electronically Signed   By: Lovena Le M.D.   On: 04/14/2019 05:13    Phillips Climes M.D on 04/15/2019 at 11:50 AM  Between 7am to 7pm - Pager - [phone]  After 7pm go to [email or web address] - password [facility]  [facility] -  Office  [phone]

## 2019-04-15 NOTE — Progress Notes (Signed)
PROGRESS NOTE                                                                                                                                                                                                             Patient Demographics:    [name], is a 69 y.o. male, DOB - [birth year], QIW:[id number]  Admit date - 04/14/2019   Admitting Physician Eduard Clos, MD  Outpatient Primary MD for the patient is Kermit Balo, DO  LOS - 1   Chief Complaint  Patient presents with  . COVID+ / SOB/Cough       Brief Narrative    69 y.o. male with medical history significant of  hypertension, diabetes mellitus, CAD, GERD, presents with shortness of breath, diagnosed with COVID-19 February 5,  He received infusion of casirivimab\imdevimab 3 days ago.  Patient presents secondary to complaints of chest pain and shortness of breath, he was noted to be hypoxic, and was admitted for further treatment.   Subjective:    [name] today ports his dyspnea has improved, denies any chest pain, reports cough.    Assessment  & Plan :    Principal Problem:   Acute respiratory failure with hypoxia (HCC) Active Problems:   Controlled type 2 DM with peripheral circulatory disorder (HCC)   Essential hypertension   Restless legs syndrome (RLS)   CAD (coronary artery disease), native coronary artery   Pneumonia due to COVID-19 virus   Hypokalemia   Diabetes mellitus type 2, uncontrolled (HCC)   GERD (gastroesophageal reflux disease)   Acute respiratory failure with hypoxia secondary to pneumonia due to COVID-19:  -Presents with dyspnea, Covid +2/5, chest x-ray significant for multifocal pneumonia, requiring 2 L nasal cannula presentation is hypoxic 88% on room air . -Continue with IV steroids . -Continue with IV remdesivir . -He received infusion of casirivimab\imdevimab 2/7. -He was encouraged to use incentive spirometry, flutter valve, to chair, and  to prone once in bed . -Antitussives as needed -Vitamin C and zinc -Continue to monitor inflammatory markers  COVID-19 Labs  Recent Labs    04/14/19 0540 04/15/19 0434  DDIMER 1.22* 0.66*  FERRITIN 614* 588*  LDH 275*  --   CRP 13.6* 12.4*    Lab Results  Component Value Date   SARSCOV2NAA POSITIVE (A) 04/14/2019   SARSCOV2NAA Not Detected  01/22/2019      Chest pain:  -  EKG and troponins unremarkable any signs of ischemia.  Secondary to musculoskeletal chest pain from cough -Continue to monitor  Hypokalemia:  - Repleted  Essential hypertension: -  Medications include Coreg 6.25 mg and valsartan 80 mg.  Patient noted to have heart rates 47-62. -continue home medications with holding parameters for HR < 55  Diabetes mellitus type 2(uncontrolled): - last hemoglobin A1c 9.2 on 03/09/2019. Home medications include Metformin 500 mg twice daily and glargine 40 units qhs. -Hold Metformin. -CBG remains uncontrolled, will add Lantus 15 units daily,. -We will start Tradjenta  CAD: Patient with previous history of stents. -Continue aspirin -Patient with some bradycardia, will decrease his Coreg dose by half.  GERD/Barrett's esophagus: Home medications Protonix 40 mg daily -Pepcid twice daily  Restless leg syndrome -Continue current home medication regimen  Code Status : Full  Family Communication  : Wife and son  2/11  Disposition Plan  : Home   Barriers For Discharge : Home, when more stable, remains on IV steroids and Remdesivir.  Consults  :  None  Procedures  : None  DVT Prophylaxis  :  [place] lovenox  Lab Results  Component Value Date   PLT 167 04/15/2019    Antibiotics  :    Anti-infectives (From admission, onward)   Start     Dose/Rate Route Frequency Ordered Stop   04/15/19 1000  remdesivir 100 mg in sodium chloride 0.9 % 100 mL IVPB     100 mg 200 mL/hr over 30 Minutes Intravenous Daily 04/14/19 0623 04/19/19 0959   04/14/19 0700   remdesivir 200 mg in sodium chloride 0.9% 250 mL IVPB     200 mg 580 mL/hr over 30 Minutes Intravenous Once 04/14/19 0623 04/14/19 0741        Objective:   Vitals:   04/14/19 2145 04/15/19 0600 04/15/19 0800 04/15/19 0900  BP: 119/66 114/76 120/68   Pulse: (!) 56 (!) 53 (!) 48   Resp: 20 (!) 25 17   Temp: 97.9 F (36.6 C) 97.6 F (36.4 C) 97.7 F (36.5 C)   TempSrc: Oral Oral Oral   SpO2: 94% 95% 98% 91%    Wt Readings from Last 3 Encounters:  03/15/19 86.2 kg  03/02/19 83 kg  12/24/18 83 kg     Intake/Output Summary (Last 24 hours) at 04/15/2019 1150 Last data filed at 04/14/2019 2236 Gross per 24 hour  Intake 743 ml  Output --  Net 743 ml     Physical Exam  Awake Alert, Oriented X 3, No new F.N deficits, Normal affect Symmetrical Chest wall movement, Good air movement bilaterally, CTAB RRR,No Gallops,Rubs or new Murmurs, No Parasternal Heave +ve B.Sounds, Abd Soft, No tenderness,  No rebound - guarding or rigidity. No Cyanosis, Clubbing or edema, No new Rash or bruise     Data Review:    CBC Recent Labs  Lab 04/14/19 0358 04/15/19 0434  WBC 6.5 7.4  HGB 14.8 13.5  HCT 43.4 39.4  PLT 164 167  MCV 88.0 87.8  MCH 30.0 30.1  MCHC 34.1 34.3  RDW 11.9 11.9  LYMPHSABS  --  0.6*  MONOABS  --  0.5  EOSABS  --  0.1  BASOSABS  --  0.0    Chemistries  Recent Labs  Lab 04/14/19 0358 04/14/19 0540 04/15/19 0434  NA 133*  --  136  K 3.4*  --  4.6  CL 97*  --  104  CO2 25  --  23  GLUCOSE 112*  --  212*  BUN 16  --  23  CREATININE 1.11  --  1.12  CALCIUM 8.4*  --  8.4*  MG  --   --  2.1  AST  --  26 19  ALT  --  24 19  ALKPHOS  --  57 54  BILITOT  --  1.4* 0.8   ------------------------------------------------------------------------------------------------------------------ Recent Labs    04/14/19 0540  TRIG 41    Lab Results  Component Value Date   HGBA1C 9.2 (H) 03/09/2019    ------------------------------------------------------------------------------------------------------------------ No results for input(s): TSH, T4TOTAL, T3FREE, THYROIDAB in the last 72 hours.  Invalid input(s): FREET3 ------------------------------------------------------------------------------------------------------------------ Recent Labs    04/14/19 0540 04/15/19 0434  FERRITIN 614* 588*    Coagulation profile No results for input(s): INR, PROTIME in the last 168 hours.  Recent Labs    04/14/19 0540 04/15/19 0434  DDIMER 1.22* 0.66*    Cardiac Enzymes No results for input(s): CKMB, TROPONINI, MYOGLOBIN in the last 168 hours.  Invalid input(s): CK ------------------------------------------------------------------------------------------------------------------    Component Value Date/Time   BNP 83.0 04/14/2019 0540    Inpatient Medications  Scheduled Meds: . albuterol  2 puff Inhalation TID  . vitamin C  500 mg Oral Daily  . aspirin EC  81 mg Oral Daily  . carvedilol  6.25 mg Oral BID WC  . dexamethasone  6 mg Oral Q24H  . enoxaparin (LOVENOX) injection  40 mg Subcutaneous Q24H  . famotidine  20 mg Oral BID  . gabapentin  200 mg Oral QPC supper  . insulin aspart  0-15 Units Subcutaneous TID WC  . insulin glargine  15 Units Subcutaneous Daily  . insulin glargine  40 Units Subcutaneous QHS  . irbesartan  75 mg Oral Daily  . pramipexole  1 mg Oral QHS  . sodium chloride flush  3 mL Intravenous Once  . sodium chloride flush  3 mL Intravenous Q12H  . zinc sulfate  220 mg Oral Daily   Continuous Infusions: . remdesivir 100 mg in NS 100 mL 100 mg (04/15/19 0938)   PRN Meds:.acetaminophen, chlorpheniramine-HYDROcodone, fluticasone, guaiFENesin-dextromethorphan, HYDROmorphone (DILAUDID) injection, ondansetron **OR** ondansetron (ZOFRAN) IV, pramipexole  Micro Results Recent Results (from the past 240 hour(s))  Respiratory Panel by RT PCR (Flu A&B, Covid) -  Nasopharyngeal Swab     Status: Abnormal   Collection Time: 04/14/19  6:45 AM   Specimen: Nasopharyngeal Swab  Result Value Ref Range Status   SARS Coronavirus 2 by RT PCR POSITIVE (A) NEGATIVE Final    Comment: RESULT CALLED TO, READ BACK BY AND VERIFIED WITH: MARSHALL RN ON [id number] AT 0846 BY NFIELDS (NOTE) SARS-CoV-2 target nucleic acids are DETECTED. SARS-CoV-2 RNA is generally detectable in upper respiratory specimens  during the acute phase of infection. Positive results are indicative of the presence of the identified virus, but do not rule out bacterial infection or co-infection with other pathogens not detected by the test. Clinical correlation with patient history and other diagnostic information is necessary to determine patient infection status. The expected result is Negative. Fact Sheet for Patients:  https://www.moore.com/ Fact Sheet for Healthcare Providers: https://www.young.biz/ This test is not yet approved or cleared by the [place] FDA and  has been authorized for detection and/or diagnosis of SARS-CoV-2 by FDA under an Emergency Use Authorization (EUA).  This EUA will remain in effect (meaning this test can be [place] ed) for the duration of  the COVID-19 declaration under  Section 564(b)(1) of the Act, 21 U.S.C. section 360bbb-3(b)(1), unless the authorization is terminated or revoked sooner.    Influenza A by PCR NEGATIVE NEGATIVE Final   Influenza B by PCR NEGATIVE NEGATIVE Final    Comment: (NOTE) The Xpert Xpress SARS-CoV-2/FLU/RSV assay is intended as an aid in  the diagnosis of influenza from Nasopharyngeal swab specimens and  should not be used as a sole basis for treatment. Nasal washings and  aspirates are unacceptable for Xpert Xpress SARS-CoV-2/FLU/RSV  testing. Fact Sheet for Patients: PinkCheek.be Fact Sheet for Healthcare  Providers: GravelBags.it This test is not yet approved or cleared by the [place] FDA and  has been authorized for detection and/or diagnosis of SARS-CoV-2 by  FDA under an Emergency Use Authorization (EUA). This EUA will remain  in effect (meaning this test can be used) for the duration of the  Covid-19 declaration under Section 564(b)(1) of the Act, 21  U.S.C. section 360bbb-3(b)(1), unless the authorization is  terminated or revoked. Performed at [facility], [place] [address] [place], [place] [address]     Radiology Reports DG Chest 2 View  Result Date: 04/08/2019 CLINICAL DATA:  Nonproductive cough for 6 months. EXAM: CHEST - 2 VIEW COMPARISON:  09/18/2015 from [place]: The heart size and mediastinal contours are within normal limits. Both lungs are clear. The visualized skeletal structures are unremarkable. IMPRESSION: No active cardiopulmonary disease. Electronically Signed   By: Marlaine Hind M.D.   On: 04/08/2019 15:28   DG Chest Portable 1 View  Result Date: 04/14/2019 CLINICAL DATA:  Chest tightness, COVID-19 positive EXAM: PORTABLE CHEST 1 VIEW COMPARISON:  Radiograph 09/18/2015 FINDINGS: Multifocal min areas of patchy airspace opacity present throughout both lungs. No pneumothorax or effusion. The cardiomediastinal contours are unremarkable. No acute osseous or soft tissue abnormality. Mild degenerative changes in the shoulders. Cardiac monitoring leads overlie the chest. IMPRESSION: Multifocal airspace disease compatible with atypical pneumonia in the setting of COVID-19 positivity. Electronically Signed   By: Lovena Le M.D.   On: 04/14/2019 05:13     Phillips Climes M.D on 04/15/2019 at 11:50 AM  Between 7am to 7pm - Pager - [phone]  After 7pm go to [email or web address] - password [facility]  [facility] -  Office  [phone]

## 2019-04-16 LAB — COMPREHENSIVE METABOLIC PANEL
ALT: 20 U/L (ref 0–44)
AST: 17 U/L (ref 15–41)
Albumin: 2.8 g/dL — ABNORMAL LOW (ref 3.5–5.0)
Alkaline Phosphatase: 61 U/L (ref 38–126)
Anion gap: 9 (ref 5–15)
BUN: 25 mg/dL — ABNORMAL HIGH (ref 8–23)
CO2: 24 mmol/L (ref 22–32)
Calcium: 8.6 mg/dL — ABNORMAL LOW (ref 8.9–10.3)
Chloride: 103 mmol/L (ref 98–111)
Creatinine, Ser: 1.1 mg/dL (ref 0.61–1.24)
GFR calc Af Amer: >60 mL/min (ref >60)
GFR calc non Af Amer: >60 mL/min (ref >60)
Glucose, Bld: 215 mg/dL — ABNORMAL HIGH (ref 70–99)
Potassium: 5.2 mmol/L — ABNORMAL HIGH (ref 3.5–5.1)
Sodium: 136 mmol/L (ref 135–145)
Total Bilirubin: 0.8 mg/dL (ref 0.3–1.2)
Total Protein: 6.2 g/dL — ABNORMAL LOW (ref 6.5–8.1)

## 2019-04-16 LAB — CBC WITH DIFFERENTIAL/PLATELET
Abs Immature Granulocytes: 0.06 10*3/uL (ref 0.00–0.07)
Basophils Absolute: 0 10*3/uL (ref 0.0–0.1)
Basophils Relative: 0 %
Eosinophils Absolute: 0 10*3/uL (ref 0.0–0.5)
Eosinophils Relative: 0 %
HCT: 41.1 % (ref 39.0–52.0)
Hemoglobin: 14 g/dL (ref 13.0–17.0)
Immature Granulocytes: 1 %
Lymphocytes Relative: 9 %
Lymphs Abs: 0.7 10*3/uL (ref 0.7–4.0)
MCH: 30 pg (ref 26.0–34.0)
MCHC: 34.1 g/dL (ref 30.0–36.0)
MCV: 88.2 fL (ref 80.0–100.0)
Monocytes Absolute: 0.6 10*3/uL (ref 0.1–1.0)
Monocytes Relative: 7 %
Neutro Abs: 6.5 10*3/uL (ref 1.7–7.7)
Neutrophils Relative %: 83 %
Platelets: 200 10*3/uL (ref 150–400)
RBC: 4.66 MIL/uL (ref 4.22–5.81)
RDW: 12.2 % (ref 11.5–15.5)
WBC: 7.9 10*3/uL (ref 4.0–10.5)
nRBC: 0 % (ref 0.0–0.2)

## 2019-04-16 LAB — MAGNESIUM: Magnesium: 2.4 mg/dL (ref 1.7–2.4)

## 2019-04-16 LAB — GLUCOSE, CAPILLARY
Glucose-Capillary: 209 mg/dL — ABNORMAL HIGH (ref 70–99)
Glucose-Capillary: 117 mg/dL — ABNORMAL HIGH (ref 70–99)
Glucose-Capillary: 242 mg/dL — ABNORMAL HIGH (ref 70–99)
Glucose-Capillary: 178 mg/dL — ABNORMAL HIGH (ref 70–99)

## 2019-04-16 LAB — D-DIMER, QUANTITATIVE: D-Dimer, Quant: 0.81 ug/mL-FEU — ABNORMAL HIGH (ref 0.00–0.50)

## 2019-04-16 LAB — FERRITIN: Ferritin: 586 ng/mL — ABNORMAL HIGH (ref 24–336)

## 2019-04-16 LAB — PHOSPHORUS: Phosphorus: 3.5 mg/dL (ref 2.5–4.6)

## 2019-04-16 LAB — C-REACTIVE PROTEIN: CRP: 9.8 mg/dL — ABNORMAL HIGH (ref <1.0)

## 2019-04-16 MED ORDER — PRAMIPEXOLE DIHYDROCHLORIDE 1 MG PO TABS
2.0000 mg | ORAL_TABLET | Freq: Every evening | ORAL | Status: DC
  Administered 2019-04-16: 2 mg via ORAL
  Filled 2019-04-16 (×2): qty 2

## 2019-04-16 MED ORDER — PANTOPRAZOLE SODIUM 40 MG PO TBEC
40.0000 mg | DELAYED_RELEASE_TABLET | Freq: Every day | ORAL | Status: DC
  Administered 2019-04-16 – 2019-04-18 (×3): 40 mg via ORAL
  Filled 2019-04-16 (×3): qty 1

## 2019-04-16 MED ORDER — PRAMIPEXOLE DIHYDROCHLORIDE 1 MG PO TABS: 1.0000 mg | ORAL_TABLET | Freq: Every day | ORAL | Status: DC

## 2019-04-16 NOTE — Progress Notes (Signed)
PROGRESS NOTE                                                                                                                                                                                                             Patient Demographics:    [name], is a 69 y.o. male, DOB - 06/10/1950, WCH:[id number]  Admit date - 04/14/2019   Admitting Physician Rise Patience, MD  Outpatient Primary MD for the patient is Gayland Curry, DO  LOS - 2   Chief Complaint  Patient presents with  . COVID SOB Cough       Brief Narrative    69 y.o. male with medical history significant of  hypertension, diabetes mellitus, CAD, GERD, presents with shortness of breath, diagnosed with COVID-19 February 5,  He received infusion of casirivimab\imdevimab 3 days ago.  Patient presents secondary to complaints of chest pain and shortness of breath, he was noted to be hypoxic, and was admitted for further treatment.   Subjective:    [name] today reports he is feeling better today, dyspnea has improved, reports had poor night sleep secondary to restless leg, still reports mild cough .    Assessment  & Plan :    Principal Problem:   Acute respiratory failure with hypoxia (HCC) Active Problems:   Controlled type 2 DM with peripheral circulatory disorder (HCC)   Essential hypertension   Restless legs syndrome (RLS)   CAD (coronary artery disease), native coronary artery   Pneumonia due to COVID-19 virus   Hypokalemia   Diabetes mellitus type 2, uncontrolled (HCC)   GERD (gastroesophageal reflux disease)   Acute respiratory failure with hypoxia secondary to pneumonia due to COVID-19:  -Presents with dyspnea, Covid +2/5, chest x-ray significant for multifocal pneumonia, requiring 2 L nasal cannula presentation is hypoxic 88% on room air , he required 2 L overnight, this morning he is on room air at rest, discussed with staff, will ambulate to see how is he doing  with activity on room air. -Continue with IV steroids . -Continue with IV remdesivir day 3/5. -He received infusion of casirivimab\imdevimab 2/7. -He was encouraged to use incentive spirometry, flutter valve, to chair, and to prone once in bed . -Antitussives as needed -Vitamin C and zinc -Continue to monitor inflammatory markers  COVID-19 Labs  Recent Labs  04/14/19 0540 04/15/19 0434 04/16/19 0251  DDIMER 1.22* 0.66* 0.81*  FERRITIN 614* 588* 586*  LDH 275*  --   --   CRP 13.6* 12.4* 9.8*    Lab Results  Component Value Date   SARSCOV2NAA POSITIVE (A) 04/14/2019   SARSCOV2NAA Not Detected 01/22/2019    Chest pain:  -  EKG and troponins unremarkable any signs of ischemia.  Secondary to musculoskeletal chest pain from cough -Continue to monitor  Hypokalemia:  - Repleted  Essential hypertension: -  Medications include Coreg 6.25 mg and valsartan 80 mg.  Patient noted to have heart rates 47-62. -Decrease patient Coreg dose to 3.125 mg p.o. twice daily given his bradycardia, overall he reports he is usually bradycardic at home that range with no symptoms . -We will hold losartan today given potassium 5.2 .  Diabetes mellitus type 2(uncontrolled): - last hemoglobin A1c 9.2 on 03/09/2019. Home medications include Metformin 500 mg twice daily and glargine 40 units qhs. -Hold Metformin. -CBG remains better controlled after starting 15 units of Lantus daily. -We will start Tradjenta  CAD: Patient with previous history of stents. -Continue aspirin, resume Coreg and valsartan when more stable. -Patient with some bradycardia, decreaseD his Coreg dose by half.  GERD/Barrett's esophagus: Home medications Protonix 40 mg daily -Pepcid twice daily  Restless leg syndrome -Continue current home medication regimen  Code Status : Full  Family Communication  : D/W wife 2/12  Disposition Plan  : Home   Barriers For Discharge : Home, when more stable, remains on IV   Remdesivir and iIV steroids.  Consults  :  None  Procedures : None  DVT Prophylaxis  :  [place] lovenox  Lab Results  Component Value Date   PLT 200 04/16/2019    Antibiotics  :    Anti-infectives (From admission, onward)   Start     Dose/Rate Route Frequency Ordered Stop   04/15/19 1000  remdesivir 100 mg in sodium chloride 0.9 % 100 mL IVPB     100 mg 200 mL/hr over 30 Minutes Intravenous Daily 04/14/19 0623 04/19/19 0959   04/14/19 0700  remdesivir 200 mg in sodium chloride 0.9% 250 mL IVPB     200 mg 580 mL/hr over 30 Minutes Intravenous Once 04/14/19 0623 04/14/19 0741        Objective:   Vitals:   04/15/19 1800 04/15/19 2154 04/16/19 0001 04/16/19 0541  BP:   103/65 127/71  Pulse:  73  (!) 49  Resp:  18  15  Temp:      TempSrc:      SpO2:  93%  95%  Weight: 78.6 kg     Height: 5\' 8"  (1.727 m)       Wt Readings from Last 3 Encounters:  04/15/19 78.6 kg  03/15/19 86.2 kg  03/02/19 83 kg     Intake/Output Summary (Last 24 hours) at 04/16/2019 1207 Last data filed at 04/16/2019 0911 Gross per 24 hour  Intake 535 ml  Output --  Net 535 ml     Physical Exam  Awake Alert, Oriented X 3, No new F.N deficits, Normal affect Symmetrical Chest wall movement, Good air movement bilaterally, CTAB RRR,No Gallops,Rubs or new Murmurs, No Parasternal Heave +ve B.Sounds, Abd Soft, No tenderness, No rebound - guarding or rigidity. No Cyanosis, Clubbing or edema, No new Rash or bruise      Data Review:    CBC Recent Labs  Lab 04/14/19 0358 04/15/19 0434 04/16/19 0251  WBC 6.5 7.4  7.9  HGB 14.8 13.5 14.0  HCT 43.4 39.4 41.1  PLT 164 167 200  MCV 88.0 87.8 88.2  MCH 30.0 30.1 30.0  MCHC 34.1 34.3 34.1  RDW 11.9 11.9 12.2  LYMPHSABS  --  0.6* 0.7  MONOABS  --  0.5 0.6  EOSABS  --  0.1 0.0  BASOSABS  --  0.0 0.0    Chemistries  Recent Labs  Lab 04/14/19 0358 04/14/19 0540 04/15/19 0434 04/16/19 0251  NA 133*  --  136 136  K 3.4*  --  4.6 5.2*  CL  97*  --  104 103  CO2 25  --  23 24  GLUCOSE 112*  --  212* 215*  BUN 16  --  23 25*  CREATININE 1.11  --  1.12 1.10  CALCIUM 8.4*  --  8.4* 8.6*  MG  --   --  2.1 2.4  AST  --  26 19 17   ALT  --  24 19 20   ALKPHOS  --  57 54 61  BILITOT  --  1.4* 0.8 0.8   ------------------------------------------------------------------------------------------------------------------ Recent Labs    04/14/19 0540  TRIG 41    Lab Results  Component Value Date   HGBA1C 9.2 (H) 03/09/2019   ------------------------------------------------------------------------------------------------------------------ No results for input(s): TSH, T4TOTAL, T3FREE, THYROIDAB in the last 72 hours.  Invalid input(s): FREET3 ------------------------------------------------------------------------------------------------------------------ Recent Labs    04/15/19 0434 04/16/19 0251  FERRITIN 588* 586*    Coagulation profile No results for input(s): INR, PROTIME in the last 168 hours.  Recent Labs    04/15/19 0434 04/16/19 0251  DDIMER 0.66* 0.81*    Cardiac Enzymes No results for input(s): CKMB, TROPONINI, MYOGLOBIN in the last 168 hours.  Invalid input(s): CK ------------------------------------------------------------------------------------------------------------------    Component Value Date/Time   BNP 83.0 04/14/2019 0540    Inpatient Medications  Scheduled Meds: . albuterol  2 puff Inhalation TID  . vitamin C  500 mg Oral Daily  . aspirin EC  81 mg Oral Daily  . carvedilol  3.125 mg Oral BID WC  . dexamethasone  10 mg Oral Q24H  . enoxaparin (LOVENOX) injection  40 mg Subcutaneous Q24H  . famotidine  20 mg Oral BID  . gabapentin  200 mg Oral QPC supper  . insulin aspart  0-15 Units Subcutaneous TID WC  . insulin aspart  4 Units Subcutaneous TID WC  . insulin glargine  15 Units Subcutaneous Daily  . insulin glargine  40 Units Subcutaneous QHS  . linagliptin  5 mg Oral Daily  .  pantoprazole  40 mg Oral Daily  . pramipexole  1 mg Oral QHS  . sodium chloride flush  3 mL Intravenous Once  . sodium chloride flush  3 mL Intravenous Q12H  . zinc sulfate  220 mg Oral Daily   Continuous Infusions: . remdesivir 100 mg in NS 100 mL 100 mg (04/16/19 0920)   PRN Meds:.acetaminophen, chlorpheniramine-HYDROcodone, fluticasone, guaiFENesin-dextromethorphan, ondansetron **OR** ondansetron (ZOFRAN) IV  Micro Results Recent Results (from the past 240 hour(s))  Blood Culture (routine x 2)     Status: None (Preliminary result)   Collection Time: 04/14/19  5:40 AM   Specimen: BLOOD  Result Value Ref Range Status   Specimen Description BLOOD RIGHT ARM  Final   Special Requests   Final    BOTTLES DRAWN AEROBIC AND ANAEROBIC Blood Culture results may not be optimal due to an inadequate volume of blood received in culture bottles   Culture  Final    NO GROWTH 2 DAYS Performed at [facility], [address], [place], [place] [address]    Report Status PENDING  Incomplete  Blood Culture (routine x 2)     Status: None (Preliminary result)   Collection Time: 04/14/19  5:40 AM   Specimen: BLOOD  Result Value Ref Range Status   Specimen Description BLOOD LEFT ARM  Final   Special Requests   Final    BOTTLES DRAWN AEROBIC AND ANAEROBIC Blood Culture results may not be optimal due to an inadequate volume of blood received in culture bottles   Culture   Final    NO GROWTH 2 DAYS Performed at [facility], [address], [place], [place] [address]    Report Status PENDING  Incomplete  Respiratory Panel by RT PCR (Flu A&B, Covid) - Nasopharyngeal Swab     Status: Abnormal   Collection Time: 04/14/19  6:45 AM   Specimen: Nasopharyngeal Swab  Result Value Ref Range Status   SARS Coronavirus 2 by RT PCR POSITIVE (A) NEGATIVE Final    Comment: RESULT CALLED TO, READ BACK BY AND VERIFIED WITH: MARSHALL RN ON [id number] AT 0846 BY NFIELDS (NOTE) SARS-CoV-2 target nucleic  acids are DETECTED. SARS-CoV-2 RNA is generally detectable in upper respiratory specimens  during the acute phase of infection. Positive results are indicative of the presence of the identified virus, but do not rule out bacterial infection or co-infection with other pathogens not detected by the test. Clinical correlation with patient history and other diagnostic information is necessary to determine patient infection status. The expected result is Negative. Fact Sheet for Patients:  https://www.moore.com/ Fact Sheet for Healthcare Providers: https://www.young.biz/ This test is not yet approved or cleared by the [place] FDA and  has been authorized for detection and/or diagnosis of SARS-CoV-2 by FDA under an Emergency Use Authorization (EUA).  This EUA will remain in effect (meaning this test can be [place] ed) for the duration of  the COVID-19 declaration under Section 564(b)(1) of the Act, 21 U.S.C. section 360bbb-3(b)(1), unless the authorization is terminated or revoked sooner.    Influenza A by PCR NEGATIVE NEGATIVE Final   Influenza B by PCR NEGATIVE NEGATIVE Final    Comment: (NOTE) The Xpert Xpress SARS-CoV-2/FLU/RSV assay is intended as an aid in  the diagnosis of influenza from Nasopharyngeal swab specimens and  should not be used as a sole basis for treatment. Nasal washings and  aspirates are unacceptable for Xpert Xpress SARS-CoV-2/FLU/RSV  testing. Fact Sheet for Patients: https://www.moore.com/ Fact Sheet for Healthcare Providers: https://www.young.biz/ This test is not yet approved or cleared by the [place] FDA and  has been authorized for detection and/or diagnosis of SARS-CoV-2 by  FDA under an Emergency Use Authorization (EUA). This EUA will remain  in effect (meaning this test can be used) for the duration of the  Covid-19 declaration under Section 564(b)(1) of the Act, 21    U.S.C. section 360bbb-3(b)(1), unless the authorization is  terminated or revoked. Performed at [facility], [address], [place], [place] [address]     Radiology Reports DG Chest 2 View  Result Date: 04/08/2019 CLINICAL DATA:  Nonproductive cough for 6 months. EXAM: CHEST - 2 VIEW COMPARISON:  09/18/2015 from [facility] FINDINGS: The heart size and mediastinal contours are within normal limits. Both lungs are clear. The visualized skeletal structures are unremarkable. IMPRESSION: No active cardiopulmonary disease. Electronically Signed   By: Danae Orleans  M.D.   On: 04/08/2019 15:28   DG Chest Portable 1 View  Result Date: 04/14/2019 CLINICAL DATA:  Chest tightness, COVID-19 positive EXAM: PORTABLE CHEST 1 VIEW COMPARISON:  Radiograph 09/18/2015 FINDINGS: Multifocal min areas of patchy airspace opacity present throughout both lungs. No pneumothorax or effusion. The cardiomediastinal contours are unremarkable. No acute osseous or soft tissue abnormality. Mild degenerative changes in the shoulders. Cardiac monitoring leads overlie the chest. IMPRESSION: Multifocal airspace disease compatible with atypical pneumonia in the setting of COVID-19 positivity. Electronically Signed   By: Kreg Shropshire M.D.   On: 04/14/2019 05:13    Huey Bienenstock M.D on 04/16/2019 at 12:07 PM  Between 7am to 7pm - Pager - [phone]  After 7pm go to [email or web address] - password [email or web address]  [facility] -  Office  [phone]

## 2019-04-16 NOTE — TOC Benefit Eligibility Note (Signed)
Transition of Care [facility]) Benefit Eligibility Note    Patient Details  Name: Kenneth Gusler Sr. MRN: [id number] Date of Birth: 05/15/1950   Medication/Dose: Jearld Lesch 5mg  qd  Covered?: Yes  Tier: 3 Drug  Prescription Coverage [facility] or [facility]  Spoke with Person/Company/Phone Number:: [employer] [facility]  Co-Pay: 30 day retail at [facility] $47/ 90 day retail at [facility] $141/ 90 day supply mail order $117.50  Prior Approval: No          [phone] [name] Phone Number: 04/16/2019, 12:00 PM

## 2019-04-16 NOTE — Progress Notes (Signed)
Uneventful overnight!  Oxygen saturation good on room air while ambulating.  Patient chose to sleep in the recliner.  Used nasal cannula off-and-on during the night.  Patient's restless leg syndrome came on quickly early in the evening.  Patient might benefit from sleep aid in addition to his Mirapex at bedtime?  Currently awake in the recliner.  He has utilized the incentive spirometer without needing prompting.

## 2019-04-16 NOTE — Progress Notes (Signed)
Called into patients room. Patient c/o "restless leg" states history of the same. Paged MD for medication for relief, patient requested to walk the halls to "walk it out" patient ambulated the department at a fast pace X4 laps on room air. No SOB noted and no distress noted. After walk patient states "restless leg went away" NAD noted.

## 2019-04-17 LAB — COMPREHENSIVE METABOLIC PANEL
ALT: 31 U/L (ref 0–44)
AST: 28 U/L (ref 15–41)
Albumin: 2.7 g/dL — ABNORMAL LOW (ref 3.5–5.0)
Alkaline Phosphatase: 52 U/L (ref 38–126)
Anion gap: 9 (ref 5–15)
BUN: 21 mg/dL (ref 8–23)
CO2: 26 mmol/L (ref 22–32)
Calcium: 8.8 mg/dL — ABNORMAL LOW (ref 8.9–10.3)
Chloride: 101 mmol/L (ref 98–111)
Creatinine, Ser: 1.08 mg/dL (ref 0.61–1.24)
GFR calc Af Amer: >60 mL/min (ref >60)
GFR calc non Af Amer: >60 mL/min (ref >60)
Glucose, Bld: 167 mg/dL — ABNORMAL HIGH (ref 70–99)
Potassium: 4.9 mmol/L (ref 3.5–5.1)
Sodium: 136 mmol/L (ref 135–145)
Total Bilirubin: 0.6 mg/dL (ref 0.3–1.2)
Total Protein: 6.3 g/dL — ABNORMAL LOW (ref 6.5–8.1)

## 2019-04-17 LAB — CBC WITH DIFFERENTIAL/PLATELET
Abs Immature Granulocytes: 0.08 10*3/uL — ABNORMAL HIGH (ref 0.00–0.07)
Basophils Absolute: 0 10*3/uL (ref 0.0–0.1)
Basophils Relative: 0 %
Eosinophils Absolute: 0.1 10*3/uL (ref 0.0–0.5)
Eosinophils Relative: 1 %
HCT: 41 % (ref 39.0–52.0)
Hemoglobin: 13.9 g/dL (ref 13.0–17.0)
Immature Granulocytes: 1 %
Lymphocytes Relative: 10 %
Lymphs Abs: 0.7 10*3/uL (ref 0.7–4.0)
MCH: 30 pg (ref 26.0–34.0)
MCHC: 33.9 g/dL (ref 30.0–36.0)
MCV: 88.4 fL (ref 80.0–100.0)
Monocytes Absolute: 0.5 10*3/uL (ref 0.1–1.0)
Monocytes Relative: 7 %
Neutro Abs: 5.7 10*3/uL (ref 1.7–7.7)
Neutrophils Relative %: 81 %
Platelets: 203 10*3/uL (ref 150–400)
RBC: 4.64 MIL/uL (ref 4.22–5.81)
RDW: 12.1 % (ref 11.5–15.5)
WBC: 7 10*3/uL (ref 4.0–10.5)
nRBC: 0 % (ref 0.0–0.2)

## 2019-04-17 LAB — GLUCOSE, CAPILLARY
Glucose-Capillary: 146 mg/dL — ABNORMAL HIGH (ref 70–99)
Glucose-Capillary: 228 mg/dL — ABNORMAL HIGH (ref 70–99)
Glucose-Capillary: 190 mg/dL — ABNORMAL HIGH (ref 70–99)
Glucose-Capillary: 252 mg/dL — ABNORMAL HIGH (ref 70–99)

## 2019-04-17 LAB — FERRITIN: Ferritin: 500 ng/mL — ABNORMAL HIGH (ref 24–336)

## 2019-04-17 LAB — C-REACTIVE PROTEIN: CRP: 4.4 mg/dL — ABNORMAL HIGH (ref <1.0)

## 2019-04-17 LAB — D-DIMER, QUANTITATIVE: D-Dimer, Quant: 0.39 ug/mL-FEU (ref 0.00–0.50)

## 2019-04-17 MED ORDER — POLYETHYLENE GLYCOL 3350 17 G PO PACK
17.0000 g | PACK | Freq: Every day | ORAL | Status: DC
  Filled 2019-04-17: qty 1

## 2019-04-17 MED ORDER — PRAMIPEXOLE DIHYDROCHLORIDE 1 MG PO TABS
2.0000 mg | ORAL_TABLET | Freq: Every evening | ORAL | Status: DC | PRN
  Administered 2019-04-17: 2 mg via ORAL
  Filled 2019-04-17 (×3): qty 2

## 2019-04-17 MED ORDER — PRAMIPEXOLE DIHYDROCHLORIDE 1 MG PO TABS
1.0000 mg | ORAL_TABLET | Freq: Once | ORAL | Status: AC
  Administered 2019-04-17: 1 mg via ORAL
  Filled 2019-04-17: qty 1

## 2019-04-17 NOTE — Progress Notes (Signed)
PROGRESS NOTE                                                                                                                                                                                                             Patient Demographics:    [name], is a 69 y.o. male, DOB - 06/25/1950, KXF:[id number]  Admit date - 04/14/2019   Admitting Physician Eduard Clos, MD  Outpatient Primary MD for the patient is Kermit Balo, DO  LOS - 3   Chief Complaint  Patient presents with  . COVID SOB Cough       Brief Narrative    69 y.o. male with medical history significant of  hypertension, diabetes mellitus, CAD, GERD, presents with shortness of breath, diagnosed with COVID-19 February 5,  He received infusion of casirivimab\imdevimab 3 days ago.  Patient presents secondary to complaints of chest pain and shortness of breath, he was noted to be hypoxic, and was admitted for further treatment.   Subjective:    [name] today reports he is feeling better today, dyspnea has improved, does report some restless leg yesterday as well.     Assessment  & Plan :    Principal Problem:   Acute respiratory failure with hypoxia (HCC) Active Problems:   Controlled type 2 DM with peripheral circulatory disorder (HCC)   Essential hypertension   Restless legs syndrome (RLS)   CAD (coronary artery disease), native coronary artery   Pneumonia due to COVID-19 virus   Hypokalemia   Diabetes mellitus type 2, uncontrolled (HCC)   GERD (gastroesophageal reflux disease)   Acute respiratory failure with hypoxia secondary to pneumonia due to COVID-19:  -Presents with dyspnea, Covid +2/5, chest x-ray significant for multifocal pneumonia, requiring 2 L nasal cannula presentation is hypoxic 88% on room air , this morning he remains on room air .. -Continue with IV steroids . -Continue with IV remdesivir day 4/5. -He received infusion of casirivimab\imdevimab  2/7. -He was encouraged to use incentive spirometry, flutter valve, to chair, and to prone once in bed . -Antitussives as needed -Vitamin C and zinc -Continue to monitor inflammatory markers  COVID-19 Labs  Recent Labs    04/15/19 0434 04/16/19 0251 04/17/19 0636  DDIMER 0.66* 0.81* 0.39  FERRITIN 588* 586* 500*  CRP 12.4* 9.8* 4.4*    Lab Results  Component Value Date   SARSCOV2NAA POSITIVE (A) 04/14/2019   SARSCOV2NAA Not Detected 01/22/2019    Chest pain:  -  EKG and troponins unremarkable any signs of ischemia.  Secondary to musculoskeletal chest pain from cough -Continue to monitor  Hypokalemia:  - Repleted  Essential hypertension: -  Medications include Coreg 6.25 mg and valsartan 80 mg.  Patient noted to have heart rates 47-62. -Decrease patient Coreg dose to 3.125 mg p.o. twice daily given his bradycardia, overall he reports he is usually bradycardic at home that range with no symptoms . -Losartan on hold given a potassium of 5.2, potassium improved to 4.9 today, will give 1 dose of MiraLAX .  Diabetes mellitus type 2(uncontrolled): - last hemoglobin A1c 9.2 on 03/09/2019. Home medications include Metformin 500 mg twice daily and glargine 40 units qhs. -Hold Metformin. -CBG remains better controlled after starting 15 units of Lantus daily. -We will start Tradjenta  CAD: Patient with previous history of stents. -Continue aspirin, resume Coreg and valsartan when more stable. -Patient with some bradycardia, decreaseD his Coreg dose by half.  GERD/Barrett's esophagus: Home medications Protonix 40 mg daily -Pepcid twice daily  Restless leg syndrome -Continue current home medication regimen  Code Status : Full  Family Communication  : D/W wife 2/12, left voicemail 2/13  Disposition Plan  : Home   Barriers For Discharge : Home tomorrow after finishing his IV remdesivir.  Consults  :  None  Procedures : None  DVT Prophylaxis  :  [place] lovenox  Lab  Results  Component Value Date   PLT 203 04/17/2019    Antibiotics  :    Anti-infectives (From admission, onward)   Start     Dose/Rate Route Frequency Ordered Stop   04/15/19 1000  remdesivir 100 mg in sodium chloride 0.9 % 100 mL IVPB     100 mg 200 mL/hr over 30 Minutes Intravenous Daily 04/14/19 0623 04/19/19 0959   04/14/19 0700  remdesivir 200 mg in sodium chloride 0.9% 250 mL IVPB     200 mg 580 mL/hr over 30 Minutes Intravenous Once 04/14/19 0623 04/14/19 0741        Objective:   Vitals:   04/17/19 0105 04/17/19 0109 04/17/19 0536 04/17/19 0540  BP: (!) 156/75  (!) 132/100 129/70  Pulse: (!) 50 (!) 59 (!) 53 (!) 49  Resp: 16 (!) 21 14 18   Temp:   (!) 97.5 F (36.4 C)   TempSrc:   Oral   SpO2: 96% 96% 94% 91%  Weight:      Height:        Wt Readings from Last 3 Encounters:  04/15/19 78.6 kg  03/15/19 86.2 kg  03/02/19 83 kg    No intake or output data in the 24 hours ending 04/17/19 1002   Physical Exam  Awake Alert, Oriented X 3, No new F.N deficits, Normal affect Symmetrical Chest wall movement, Good air movement bilaterally, CTAB RRR,No Gallops,Rubs or new Murmurs, No Parasternal Heave +ve B.Sounds, Abd Soft, No tenderness, No rebound - guarding or rigidity. No Cyanosis, Clubbing or edema, No new Rash or bruise      Data Review:    CBC Recent Labs  Lab 04/14/19 0358 04/15/19 0434 04/16/19 0251 04/17/19 0636  WBC 6.5 7.4 7.9 7.0  HGB 14.8 13.5 14.0 13.9  HCT 43.4 39.4 41.1 41.0  PLT 164 167 200 203  MCV 88.0 87.8 88.2 88.4  MCH 30.0 30.1 30.0 30.0  MCHC 34.1 34.3 34.1 33.9  RDW 11.9 11.9 12.2 12.1  LYMPHSABS  --  0.6* 0.7 0.7  MONOABS  --  0.5 0.6 0.5  EOSABS  --  0.1 0.0 0.1  BASOSABS  --  0.0 0.0 0.0    Chemistries  Recent Labs  Lab 04/14/19 0358 04/14/19 0540 04/15/19 0434 04/16/19 0251 04/17/19 0636  NA 133*  --  136 136 136  K 3.4*  --  4.6 5.2* 4.9  CL 97*  --  104 103 101  CO2 25  --  23 24 26   GLUCOSE 112*  --   212* 215* 167*  BUN 16  --  23 25* 21  CREATININE 1.11  --  1.12 1.10 1.08  CALCIUM 8.4*  --  8.4* 8.6* 8.8*  MG  --   --  2.1 2.4  --   AST  --  26 19 17 28   ALT  --  24 19 20 31   ALKPHOS  --  57 54 61 52  BILITOT  --  1.4* 0.8 0.8 0.6   ------------------------------------------------------------------------------------------------------------------ No results for input(s): CHOL, HDL, LDLCALC, TRIG, CHOLHDL, LDLDIRECT in the last 72 hours.  Lab Results  Component Value Date   HGBA1C 9.2 (H) 03/09/2019   ------------------------------------------------------------------------------------------------------------------ No results for input(s): TSH, T4TOTAL, T3FREE, THYROIDAB in the last 72 hours.  Invalid input(s): FREET3 ------------------------------------------------------------------------------------------------------------------ Recent Labs    04/16/19 0251 04/17/19 0636  FERRITIN 586* 500*    Coagulation profile No results for input(s): INR, PROTIME in the last 168 hours.  Recent Labs    04/16/19 0251 04/17/19 0636  DDIMER 0.81* 0.39    Cardiac Enzymes No results for input(s): CKMB, TROPONINI, MYOGLOBIN in the last 168 hours.  Invalid input(s): CK ------------------------------------------------------------------------------------------------------------------    Component Value Date/Time   BNP 83.0 04/14/2019 0540    Inpatient Medications  Scheduled Meds: . albuterol  2 puff Inhalation TID  . vitamin C  500 mg Oral Daily  . aspirin EC  81 mg Oral Daily  . carvedilol  3.125 mg Oral BID WC  . dexamethasone  10 mg Oral Q24H  . enoxaparin (LOVENOX) injection  40 mg Subcutaneous Q24H  . famotidine  20 mg Oral BID  . gabapentin  200 mg Oral QPC supper  . insulin aspart  0-15 Units Subcutaneous TID WC  . insulin aspart  4 Units Subcutaneous TID WC  . insulin glargine  15 Units Subcutaneous Daily  . insulin glargine  40 Units Subcutaneous QHS  .  linagliptin  5 mg Oral Daily  . pantoprazole  40 mg Oral Daily  . polyethylene glycol  17 g Oral Daily  . sodium chloride flush  3 mL Intravenous Once  . sodium chloride flush  3 mL Intravenous Q12H  . zinc sulfate  220 mg Oral Daily   Continuous Infusions: . remdesivir 100 mg in NS 100 mL 100 mg (04/16/19 0920)   PRN Meds:.acetaminophen, chlorpheniramine-HYDROcodone, fluticasone, guaiFENesin-dextromethorphan, ondansetron **OR** ondansetron (ZOFRAN) IV, pramipexole  Micro Results Recent Results (from the past 240 hour(s))  Blood Culture (routine x 2)     Status: None (Preliminary result)   Collection Time: 04/14/19  5:40 AM   Specimen: BLOOD  Result Value Ref Range Status   Specimen Description BLOOD RIGHT ARM  Final   Special Requests   Final    BOTTLES DRAWN AEROBIC AND ANAEROBIC Blood Culture results may not be optimal due to an inadequate volume of blood received in culture bottles   Culture   Final    NO  GROWTH 2 DAYS Performed at [facility], [address], [place], [place] [address]    Report Status PENDING  Incomplete  Blood Culture (routine x 2)     Status: None (Preliminary result)   Collection Time: 04/14/19  5:40 AM   Specimen: BLOOD  Result Value Ref Range Status   Specimen Description BLOOD LEFT ARM  Final   Special Requests   Final    BOTTLES DRAWN AEROBIC AND ANAEROBIC Blood Culture results may not be optimal due to an inadequate volume of blood received in culture bottles   Culture   Final    NO GROWTH 2 DAYS Performed at [facility], [address] [place], [place] [address]    Report Status PENDING  Incomplete  Respiratory Panel by RT PCR (Flu A&B, Covid) - Nasopharyngeal Swab     Status: Abnormal   Collection Time: 04/14/19  6:45 AM   Specimen: Nasopharyngeal Swab  Result Value Ref Range Status   SARS Coronavirus 2 by RT PCR POSITIVE (A) NEGATIVE Final    Comment: RESULT CALLED TO, READ BACK BY AND VERIFIED WITH: MARSHALL RN ON [id number] AT  0846 BY NFIELDS (NOTE) SARS-CoV-2 target nucleic acids are DETECTED. SARS-CoV-2 RNA is generally detectable in upper respiratory specimens  during the acute phase of infection. Positive results are indicative of the presence of the identified virus, but do not rule out bacterial infection or co-infection with other pathogens not detected by the test. Clinical correlation with patient history and other diagnostic information is necessary to determine patient infection status. The expected result is Negative. Fact Sheet for Patients:  https://www.moore.com/ Fact Sheet for Healthcare Providers: https://www.young.biz/ This test is not yet approved or cleared by the [place] FDA and  has been authorized for detection and/or diagnosis of SARS-CoV-2 by FDA under an Emergency Use Authorization (EUA).  This EUA will remain in effect (meaning this test can be [place] ed) for the duration of  the COVID-19 declaration under Section 564(b)(1) of the Act, 21 U.S.C. section 360bbb-3(b)(1), unless the authorization is terminated or revoked sooner.    Influenza A by PCR NEGATIVE NEGATIVE Final   Influenza B by PCR NEGATIVE NEGATIVE Final    Comment: (NOTE) The Xpert Xpress SARS-CoV-2/FLU/RSV assay is intended as an aid in  the diagnosis of influenza from Nasopharyngeal swab specimens and  should not be used as a sole basis for treatment. Nasal washings and  aspirates are unacceptable for Xpert Xpress SARS-CoV-2/FLU/RSV  testing. Fact Sheet for Patients: https://www.moore.com/ Fact Sheet for Healthcare Providers: https://www.young.biz/ This test is not yet approved or cleared by the [place] FDA and  has been authorized for detection and/or diagnosis of SARS-CoV-2 by  FDA under an Emergency Use Authorization (EUA). This EUA will remain  in effect (meaning this test can be used) for the duration of the  Covid-19  declaration under Section 564(b)(1) of the Act, 21  U.S.C. section 360bbb-3(b)(1), unless the authorization is  terminated or revoked. Performed at [facility], [address], [place], [place] [address]     Radiology Reports DG Chest 2 View  Result Date: 04/08/2019 CLINICAL DATA:  Nonproductive cough for 6 months. EXAM: CHEST - 2 VIEW COMPARISON:  09/18/2015 from [facility] FINDINGS: The heart size and mediastinal contours are within normal limits. Both lungs are clear. The visualized skeletal structures are unremarkable. IMPRESSION: No active cardiopulmonary disease. Electronically Signed   By: Danae Orleans M.D.   On: 04/08/2019 15:28  DG Chest Portable 1 View  Result Date: 04/14/2019 CLINICAL DATA:  Chest tightness, COVID-19 positive EXAM: PORTABLE CHEST 1 VIEW COMPARISON:  Radiograph 09/18/2015 FINDINGS: Multifocal min areas of patchy airspace opacity present throughout both lungs. No pneumothorax or effusion. The cardiomediastinal contours are unremarkable. No acute osseous or soft tissue abnormality. Mild degenerative changes in the shoulders. Cardiac monitoring leads overlie the chest. IMPRESSION: Multifocal airspace disease compatible with atypical pneumonia in the setting of COVID-19 positivity. Electronically Signed   By: Lovena Le M.D.   On: 04/14/2019 05:13    Phillips Climes M.D on 04/17/2019 at 10:02 AM  Between 7am to 7pm - Pager - [phone]  After 7pm go to [email or web address] - password [facility]  [facility] -  Office  [phone]

## 2019-04-17 NOTE — Progress Notes (Signed)
SATURATION QUALIFICATIONS: (This note is used to comply with regulatory documentation for home oxygen)  Patient Saturations on Room Air at Rest = 94%  Patient Saturations on Room Air while Ambulating = 90%  Please briefly explain why patient needs home oxygen: N/A Pt does not qualify for home O2 needs.

## 2019-04-18 ENCOUNTER — Encounter (HOSPITAL_COMMUNITY): Payer: Self-pay | Admitting: Internal Medicine

## 2019-04-18 LAB — COMPREHENSIVE METABOLIC PANEL
ALT: 43 U/L (ref 0–44)
AST: 30 U/L (ref 15–41)
Albumin: 3 g/dL — ABNORMAL LOW (ref 3.5–5.0)
Alkaline Phosphatase: 63 U/L (ref 38–126)
Anion gap: 9 (ref 5–15)
BUN: 21 mg/dL (ref 8–23)
CO2: 27 mmol/L (ref 22–32)
Calcium: 9.2 mg/dL (ref 8.9–10.3)
Chloride: 101 mmol/L (ref 98–111)
Creatinine, Ser: 1.09 mg/dL (ref 0.61–1.24)
GFR calc Af Amer: >60 mL/min (ref >60)
GFR calc non Af Amer: >60 mL/min (ref >60)
Glucose, Bld: 117 mg/dL — ABNORMAL HIGH (ref 70–99)
Potassium: 4.5 mmol/L (ref 3.5–5.1)
Sodium: 137 mmol/L (ref 135–145)
Total Bilirubin: 0.6 mg/dL (ref 0.3–1.2)
Total Protein: 6.7 g/dL (ref 6.5–8.1)

## 2019-04-18 LAB — CBC WITH DIFFERENTIAL/PLATELET
Abs Immature Granulocytes: 0.09 10*3/uL — ABNORMAL HIGH (ref 0.00–0.07)
Basophils Absolute: 0 10*3/uL (ref 0.0–0.1)
Basophils Relative: 0 %
Eosinophils Absolute: 0 10*3/uL (ref 0.0–0.5)
Eosinophils Relative: 0 %
HCT: 44.6 % (ref 39.0–52.0)
Hemoglobin: 14.9 g/dL (ref 13.0–17.0)
Immature Granulocytes: 1 %
Lymphocytes Relative: 10 %
Lymphs Abs: 0.7 10*3/uL (ref 0.7–4.0)
MCH: 30.1 pg (ref 26.0–34.0)
MCHC: 33.4 g/dL (ref 30.0–36.0)
MCV: 90.1 fL (ref 80.0–100.0)
Monocytes Absolute: 0.5 10*3/uL (ref 0.1–1.0)
Monocytes Relative: 7 %
Neutro Abs: 5.9 10*3/uL (ref 1.7–7.7)
Neutrophils Relative %: 82 %
Platelets: 242 10*3/uL (ref 150–400)
RBC: 4.95 MIL/uL (ref 4.22–5.81)
RDW: 12.1 % (ref 11.5–15.5)
WBC: 7.2 10*3/uL (ref 4.0–10.5)
nRBC: 0 % (ref 0.0–0.2)

## 2019-04-18 LAB — GLUCOSE, CAPILLARY
Glucose-Capillary: 119 mg/dL — ABNORMAL HIGH (ref 70–99)
Glucose-Capillary: 181 mg/dL — ABNORMAL HIGH (ref 70–99)

## 2019-04-18 LAB — D-DIMER, QUANTITATIVE: D-Dimer, Quant: 0.6 ug/mL-FEU — ABNORMAL HIGH (ref 0.00–0.50)

## 2019-04-18 LAB — C-REACTIVE PROTEIN: CRP: 3.8 mg/dL — ABNORMAL HIGH (ref <1.0)

## 2019-04-18 LAB — FERRITIN: Ferritin: 487 ng/mL — ABNORMAL HIGH (ref 24–336)

## 2019-04-18 MED ORDER — ACETAMINOPHEN 325 MG PO TABS: 650.0000 mg | ORAL_TABLET | Freq: Four times a day (QID) | ORAL | Status: DC | PRN

## 2019-04-18 MED ORDER — CARVEDILOL 3.125 MG PO TABS: 3.1250 mg | ORAL_TABLET | Freq: Two times a day (BID) | ORAL | 0 refills | Status: DC

## 2019-04-18 MED ORDER — FAMOTIDINE 20 MG PO TABS: 20.0000 mg | ORAL_TABLET | Freq: Two times a day (BID) | ORAL | 0 refills | Status: DC

## 2019-04-18 MED ORDER — DEXAMETHASONE 2 MG PO TABS: 10.0000 mg | ORAL_TABLET | ORAL | 0 refills | Status: DC

## 2019-04-18 NOTE — Discharge Instructions (Signed)
Person Under Monitoring Name: Kenneth Copher Sr.  Location: [address] [place] [address]   Infection Prevention Recommendations for Individuals Confirmed to have, or Being Evaluated for, 2019 Novel Coronavirus (COVID-19) Infection Who Receive Care at Home  Individuals who are confirmed to have, or are being evaluated for, COVID-19 should follow the prevention steps below until a healthcare provider or local or state health department says they can return to normal activities.  Stay home except to get medical care You should restrict activities outside your home, except for getting medical care. Do not go to work, school, or public areas, and do not use public transportation or taxis.  Call ahead before visiting your doctor Before your medical appointment, call the healthcare provider and tell them that you have, or are being evaluated for, COVID-19 infection. This will help the healthcare providers office take steps to keep other people from getting infected. Ask your healthcare provider to call the local or state health department.  Monitor your symptoms Seek prompt medical attention if your illness is worsening (e.g., difficulty breathing). Before going to your medical appointment, call the healthcare provider and tell them that you have, or are being evaluated for, COVID-19 infection. Ask your healthcare provider to call the local or state health department.  Wear a facemask You should wear a facemask that covers your nose and mouth when you are in the same room with other people and when you visit a healthcare provider. People who live with or visit you should also wear a facemask while they are in the same room with you.  Separate yourself from other people in your home As much as possible, you should stay in a different room from other people in your home. Also, you should use a separate bathroom, if available.  Avoid sharing household items You should not share  dishes, drinking glasses, cups, eating utensils, towels, bedding, or other items with other people in your home. After using these items, you should wash them thoroughly with soap and water.  Cover your coughs and sneezes Cover your mouth and nose with a tissue when you cough or sneeze, or you can cough or sneeze into your sleeve. Throw used tissues in a lined trash can, and immediately wash your hands with soap and water for at least 20 seconds or use an alcohol-based hand rub.  Wash your [employer] your hands often and thoroughly with soap and water for at least 20 seconds. You can use an alcohol-based hand sanitizer if soap and water are not available and if your hands are not visibly dirty. Avoid touching your eyes, nose, and mouth with unwashed hands.   Prevention Steps for Caregivers and Household Members of Individuals Confirmed to have, or Being Evaluated for, COVID-19 Infection Being Cared for in the Home  If you live with, or provide care at home for, a person confirmed to have, or being evaluated for, COVID-19 infection please follow these guidelines to prevent infection:  Follow healthcare providers instructions Make sure that you understand and can help the patient follow any healthcare provider instructions for all care.  Provide for the patients basic needs You should help the patient with basic needs in the home and provide support for getting groceries, prescriptions, and other personal needs.  Monitor the patients symptoms If they are getting sicker, call his or her medical provider and tell them that the patient has, or is being evaluated for, COVID-19 infection. This will help the healthcare providers office  take steps to keep other people from getting infected. Ask the healthcare provider to call the local or state health department.  Limit the number of people who have contact with the patient  If possible, have only one caregiver for the patient.  Other  household members should stay in another home or place of residence. If this is not possible, they should stay  in another room, or be separated from the patient as much as possible. Use a separate bathroom, if available.  Restrict visitors who do not have an essential need to be in the home.  Keep older adults, very young children, and other sick people away from the patient Keep older adults, very young children, and those who have compromised immune systems or chronic health conditions away from the patient. This includes people with chronic heart, lung, or kidney conditions, diabetes, and cancer.  Ensure good ventilation Make sure that shared spaces in the home have good air flow, such as from an air conditioner or an opened window, weather permitting.  Wash your hands often  Wash your hands often and thoroughly with soap and water for at least 20 seconds. You can use an alcohol based hand sanitizer if soap and water are not available and if your hands are not visibly dirty.  Avoid touching your eyes, nose, and mouth with unwashed hands.  Use disposable paper towels to dry your hands. If not available, use dedicated cloth towels and replace them when they become wet.  Wear a facemask and gloves  Wear a disposable facemask at all times in the room and gloves when you touch or have contact with the patients blood, body fluids, and/or secretions or excretions, such as sweat, saliva, sputum, nasal mucus, vomit, urine, or feces.  Ensure the mask fits over your nose and mouth tightly, and do not touch it during use.  Throw out disposable facemasks and gloves after using them. Do not reuse.  Wash your hands immediately after removing your facemask and gloves.  If your personal clothing becomes contaminated, carefully remove clothing and launder. Wash your hands after handling contaminated clothing.  Place all used disposable facemasks, gloves, and other waste in a lined container before  disposing them with other household waste.  Remove gloves and wash your hands immediately after handling these items.  Do not share dishes, glasses, or other household items with the patient  Avoid sharing household items. You should not share dishes, drinking glasses, cups, eating utensils, towels, bedding, or other items with a patient who is confirmed to have, or being evaluated for, COVID-19 infection.  After the person uses these items, you should wash them thoroughly with soap and water.  Wash laundry thoroughly  Immediately remove and wash clothes or bedding that have blood, body fluids, and/or secretions or excretions, such as sweat, saliva, sputum, nasal mucus, vomit, urine, or feces, on them.  Wear gloves when handling laundry from the patient.  Read and follow directions on labels of laundry or clothing items and detergent. In general, wash and dry with the warmest temperatures recommended on the label.  Clean all areas the individual has used often  Clean all touchable surfaces, such as counters, tabletops, doorknobs, bathroom fixtures, toilets, phones, keyboards, tablets, and bedside tables, every day. Also, clean any surfaces that may have blood, body fluids, and/or secretions or excretions on them.  Wear gloves when cleaning surfaces the patient has come in contact with.  Use a diluted bleach solution (e.g., dilute bleach with 1 part  bleach and 10 parts water) or a household disinfectant with a label that says EPA-registered for coronaviruses. To make a bleach solution at home, add 1 tablespoon of bleach to 1 quart (4 cups) of water. For a larger supply, add  cup of bleach to 1 gallon (16 cups) of water.  Read labels of cleaning products and follow recommendations provided on product labels. Labels contain instructions for safe and effective use of the cleaning product including precautions you should take when applying the product, such as wearing gloves or eye protection  and making sure you have good ventilation during use of the product.  Remove gloves and wash hands immediately after cleaning.  Monitor yourself for signs and symptoms of illness Caregivers and household members are considered close contacts, should monitor their health, and will be asked to limit movement outside of the home to the extent possible. Follow the monitoring steps for close contacts listed on the symptom monitoring form.   ? If you have additional questions, contact your local health department or call the epidemiologist on call at [phone] (available 24/7). ? This guidance is subject to change. For the most up-to-date guidance from [facility], please refer to their website: YouBlogs.pl

## 2019-04-18 NOTE — Progress Notes (Signed)
Pt given discharge instructions, prescriptions, and care notes. Pt verbalized understanding AEB no further questions or concerns at this time. IV was discontinued, no redness, pain, or swelling noted at this time. Telemetry discontinued and Centralized Telemetry was notified. Pt left the floor via wheelchair with staff in stable condition. 

## 2019-04-18 NOTE — Progress Notes (Signed)
   04/17/19 2300  Mobility  Activity Ambulated in hall  Level of Assistance Standby assist, set-up cues, supervision of patient - no hands on  Assistive Device None  Distance Ambulated (ft) 850 ft (Approximately.)  Mobility Response Tolerated well   Patient continued to have difficulty with restless legs, even after medication. This RN suggested ambulating in hallway. During ambulation, SpO2 maintained 91% and above on RA, HR maintained 66 and lower. Restless legs has improved, and patient resting in bed at this time. Will continue to monitor.

## 2019-04-18 NOTE — Discharge Summary (Signed)
Kenneth Defer Sr., is a 69 y.o. male  DOB 1950-12-12  MRN [id number].  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 Arthritis    "left thumb" (05/23/2017)  . Barrett's esophagus    "we've been told that it's all gone; still take RX for GERD" (05/23/2017)  . CAD (coronary artery disease), native coronary artery    Coronary angiogram 05/03/2014:  Proximal LAD 3.0x12 mm Promus premier stent.  04/08/2014: Mid Cx 3.5 x 16 mm Promus DES.  03/29/2013: Mid LAD 2.75 x 38 mm Promus Premier drug-eluting stent, balloon angioplasty of D1 and distal LAD and stenting of distal RCA with 2.75 x 24 mm promos Premier drug-eluting stent 12/15/2012 widely patent.  . Cervicalgia   . Chronic bronchitis (HCC)    "q yr" (05/23/2017)  . Complication of anesthesia    " I do not wake up very well "  . COVID-19   . Family history of adverse reaction to anesthesia    "son w/PONV"  . GERD (gastroesophageal reflux disease)   . Heart murmur    "grew out of  it"   . Hyperlipidemia   . Hypoglycemia, unspecified   . IDDM (insulin dependent diabetes mellitus)   . Impotence of organic origin   . Iron deficiency anemia   . Other malaise and fatigue   . PONV (postoperative nausea and vomiting)   . Restless leg   . Tension headache    "sometimes" (05/23/2017)  . Unspecified gastritis and gastroduodenitis with hemorrhage   . Unstable angina pectoris (HCC) 04/08/2014   Coronary angiogram 05/03/2014:  Proximal LAD 3.0x12 mm Promus premier stent.  04/08/2014: Mid Cx 3.5 x 16 mm Promus DES.  03/29/2013: Mid LAD 2.75 x 38 mm Promus Premier drug-eluting stent, balloon angioplasty of D1 and distal LAD and stenting of distal RCA with 2.75 x 24 mm promos Premier drug-eluting stent 12/15/2012 widely patent.    Past Surgical History:  Procedure Laterality Date  . CARDIAC CATHETERIZATION N/A 09/25/2015   Procedure: Left Heart Cath and Coronary Angiography;  Surgeon: [name], MD;  Location[facility] INVASIVE CV LAB;  Service: Cardiovascular;  Laterality: N/A;  .  CARDIAC CATHETERIZATION N/A 09/25/2015   Procedure: Intravascular Pressure Wire/FFR Study;  Surgeon: [name], MD;  Location: [facility] INVASIVE CV LAB;  Service: Cardiovascular;  Laterality: N/A;  . CORONARY ANGIOPLASTY WITH STENT PLACEMENT  12/15/2012; 04/28/2014; 05/03/2014   "2; 1; 1"   . FRACTIONAL FLOW RESERVE WIRE Right 03/26/2013   Procedure: FRACTIONAL FLOW RESERVE WIRE;  Surgeon: Pamella Pert, MD;  Location: [facility] CATH LAB;  Service: Cardiovascular;  Laterality: Right;  . FRACTIONAL FLOW RESERVE WIRE N/A 05/03/2014   Procedure: FRACTIONAL FLOW RESERVE WIRE;  Surgeon: Pamella Pert, MD;  Location: [facility] CATH LAB;  Service: Cardiovascular;  Laterality: N/A;  . HERNIA REPAIR  2010   "umbilical"   . INCISION AND DRAINAGE ABSCESS Left 05/2007   "groin"   . KNEE SURGERY Right 1992;  2000   "calcium deposits removed" (12/15/2012)  . LEFT HEART CATH AND CORONARY ANGIOGRAPHY N/A 05/23/2017   Procedure: LEFT HEART CATH AND  CORONARY ANGIOGRAPHY;  Surgeon: [name], MD;  Location: [facility] INVASIVE CV LAB;  Service: Cardiovascular;  Laterality: N/A;  . LEFT HEART CATHETERIZATION WITH CORONARY ANGIOGRAM N/A 12/15/2012   Procedure: LEFT HEART CATHETERIZATION WITH CORONARY ANGIOGRAM;  Surgeon: Pamella Pert, MD;  Location: [facility] CATH LAB;  Service: Cardiovascular;  Laterality: N/A;  . LEFT HEART CATHETERIZATION WITH CORONARY ANGIOGRAM N/A 03/26/2013   Procedure: LEFT HEART CATHETERIZATION WITH CORONARY ANGIOGRAM;  Surgeon: Pamella Pert, MD;  Location: [facility] CATH LAB;  Service: Cardiovascular;  Laterality: N/A;  . LEFT HEART CATHETERIZATION WITH CORONARY ANGIOGRAM N/A 04/08/2014   Procedure: LEFT HEART CATHETERIZATION WITH CORONARY ANGIOGRAM;  Surgeon: Micheline Chapman, MD;  Location: [facility] CATH LAB;  Service: Cardiovascular;  Laterality: N/A;  . PERCUTANEOUS CORONARY STENT INTERVENTION (PCI-S) N/A 05/03/2014   Procedure: PERCUTANEOUS CORONARY STENT INTERVENTION (PCI-S);  Surgeon: Pamella Pert, MD;  Location: [facility] CATH LAB;  Service: Cardiovascular;  Laterality: N/A;  . UMBILICAL HERNIA REPAIR         History of present illness and  Hospital Course:     Kindly see H&P for history of present illness and admission details, please review complete Labs, Consult reports and Test reports for all details in brief  HPI  from the history and physical done on the day of admission 04/14/2019  HPI: Kenneth Rounsaville Sr. is a 69 y.o. male with medical history significant of  Symptoms initially started on 17 January with complaints of sore throat progressed to a dry cough.  He was seen by his PCP who gave him a 7-day course of Biaxin, but states that it didn't help his symptoms.  He thereafter tested positive for COVID-19 on February 5.  He was taking vitamin C and vitmin D. Notes associated symptoms of change and taste, decreased appetite, and diarrhea.  He received infusion of casirivimab\imdevimab 3 days ago.  Patient reported having some  substernal chest pain yesterday for which he called EMS.  After being evaluated it was determined that he did not require transport.  Denies having any vomiting, myalgias, headaches, or dysuria. He has never smoked or required oxygen.  This morning around 3 AM patient reported feeling like he could not get any air.  ED Course: Upon admission into the emergency department patient was seen to be afebrile, respirations 01/29/1963, O2 saturations as low as 88% on room air, and placed on 2 L nasal cannula oxygen.  Labs significant for WBC 6.5, potassium 3.4, total bilirubin 1.4, LDH 255, lactic acid 1.2, and high-sensitivity troponin negative x2.  Chest  x-ray significant for multifocal pneumonia.  Patient was given 6 mg of Decadron IV, remdesivir, and 1000 mg of acetaminophen. TR[facility] called to admit.  Hospital Course   Acute respiratory failure with hypoxiasecondary to pneumonia due to COVID-19:  -Presents with dyspnea, Covid +2/5, chest x-ray significant for multifocal pneumonia, requiring 2 L nasal cannula presentation is hypoxic 88% on room air ,  hypoxia has resolved, he has been tolerating room air for last 24 hours -Treat with steroids during hospital stay, to finish another 5 days of oral Decadron as an outpatient for total of 10 days treatment -Treated with 5 days of IV remdesivir -He received infusion ofcasirivimab\imdevimab 2/7. -He was encouraged to use incentive spirometry, flutter valve, to chair, to continue doing that as an outpatient  Chest pain:  - EKG and troponins unremarkable any signs of ischemia.  Secondary to musculoskeletal chest pain from cough  Hypokalemia:  - Repleted  Essential hypertension: -  Medications include Coreg 6.25 mg and valsartan 80 mg. Patient noted to have heart rates 47-62. -Decreased patient Coreg dose to 3.125 mg p.o. twice daily on discharge given his bradycardia, overall he reports he is usually bradycardic at home that range with no symptoms . -  resume valsartan on discharge  Diabetes mellitus type 2(uncontrolled): - last hemoglobin A1c 9.2on 03/09/2019.Home medications include Metformin 500 mg twice dailyand glargine 40 units qhs.  CBG uncontrolled during hospital stay given he was on steroids, as well he was treated with Tradjenta during hospital stay given his COVID-19. -He will be discharged on his home regimen.   CAD: Patient with previous history of stents. -Continue aspirin,  Coreg and valsartan  -Patient with some bradycardia, decreased his Coreg dose by half.  GERD/Barrett's esophagus: Home medications Protonix 40 mg daily -Pepcid twice daily  Restless leg syndrome -Continue current home medication regimen   Discharge Condition:  stable   Follow UP  Follow-up Information    Reed, Tiffany L, DO Follow up in 2 week(s).   Specialty: Geriatric Medicine Contact information: [address] [place] [address] [phone]             Discharge Instructions  and  Discharge Medications    Discharge Instructions    Discharge instructions   Complete by: As directed    Follow with Primary MD Renato Gails, Tiffany L, DO in 14 days   Get CBC, CMP checked  by Primary MD next visit.    Activity: As tolerated with Full fall precautions use walker/cane & assistance as needed   Disposition Home    Diet: Heart Healthy /Carb modifed , with feeding assistance and aspiration precautions.  For Heart failure patients - Check your Weight same time everyday, if you gain over 2 pounds, or you develop in leg swelling, experience more shortness of breath or chest pain, call your Primary MD immediately. Follow Cardiac Low Salt Diet and 1.5 lit/day fluid restriction.   On your next visit with your primary care physician please Get Medicines reviewed and adjusted.   Please request your Prim.MD to go over all Hospital Tests and Procedure/Radiological results at the follow up, please get all Hospital records sent to your  Prim MD by signing hospital release before you go home.   If you experience worsening of your admission symptoms, develop shortness of breath, life threatening emergency, suicidal or homicidal thoughts you must seek medical attention immediately by calling 911 or calling your MD immediately  if symptoms less severe.  You Must read complete instructions/literature along  with all the possible adverse reactions/side effects for all the Medicines you take and that have been prescribed to you. Take any new Medicines after you have completely understood and accpet all the possible adverse reactions/side effects.   Do not drive, operating heavy machinery, perform activities at heights, swimming or participation in water activities or provide baby sitting services if your were admitted for syncope or siezures until you have seen by Primary MD or a Neurologist and advised to do so again.  Do not drive when taking Pain medications.    Do not take more than prescribed Pain, Sleep and Anxiety Medications  Special Instructions: If you have smoked or chewed Tobacco  in the last 2 yrs please stop smoking, stop any regular Alcohol  and or any Recreational drug use.  Wear Seat belts while driving.   Please note  You were cared for by a hospitalist during your hospital stay. If you have any questions about your discharge medications or the care you received while you were in the hospital after you are discharged, you can call the unit and asked to speak with the hospitalist on call if the hospitalist that took care of you is not available. Once you are discharged, your primary care physician will handle any further medical issues. Please note that NO REFILLS for any discharge medications will be authorized once you are discharged, as it is imperative that you return to your primary care physician (or establish a relationship with a primary care physician if you do not have one) for your aftercare needs so that  they can reassess your need for medications and monitor your lab values.   Increase activity slowly   Complete by: As directed      Allergies as of 04/18/2019      Reactions   Lyrica [pregabalin] Other (See Comments)   Made patient very lethargic the next morning, very hard to patient to function, move, etc.    Statins Other (See Comments)   Causes Restless Legs, rhabdomyolysis   Metformin And Related    GI upset   Trulicity [dulaglutide] Other (See Comments)   GI side effects       Medication List    TAKE these medications   acetaminophen 325 MG tablet Commonly known as: TYLENOL Take 2 tablets (650 mg total) by mouth every 6 (six) hours as needed for mild pain or headache (fever >/= 101). What changed:   medication strength  how much to take  reasons to take this   albuterol 108 (90 Base) MCG/ACT inhaler Commonly known as: VENTOLIN HFA Inhale 2 puffs into the lungs every 6 (six) hours as needed for wheezing or shortness of breath.   aspirin 81 MG EC tablet Take 1 tablet (81 mg total) by mouth daily.   Basaglar KwikPen 100 UNIT/ML Sopn INJECT 40 UNITS SUBCUTANEOUSLY INTO THE SKIN AT BEDTIME What changed: See the new instructions.   BD Pen Needle Nano U/F 32G X 4 MM Misc Generic drug: Insulin Pen Needle USE TWICE DAILY WITH LANTUS PEN   benzonatate 200 MG capsule Commonly known as: TESSALON Take 200 mg by mouth 3 (three) times daily as needed for cough.   carvedilol 3.125 MG tablet Commonly known as: COREG Take 1 tablet (3.125 mg total) by mouth 2 (two) times daily with a meal. What changed:   medication strength  See the new instructions.   [employer] Vitamin C 500 MG tablet Generic drug: ascorbic acid TAKE 1 TABLET BY MOUTH TWICE DAILY  What changed: how much to take   D-5000 125 MCG (5000 UT) Tabs Generic drug: Cholecalciferol TAKE 1 TABLET BY MOUTH EVERY DAY What changed: how much to take   dexamethasone 2 MG tablet Commonly known as: DECADRON Take 5  tablets (10 mg total) by mouth daily. Start taking on: April 19, 2019   dextromethorphan-guaiFENesin 30-600 MG 12hr tablet Commonly known as: MUCINEX DM Take 1 tablet by mouth 2 (two) times daily as needed for cough.   famotidine 20 MG tablet Commonly known as: PEPCID Take 1 tablet (20 mg total) by mouth 2 (two) times daily.   fluticasone 50 MCG/ACT nasal spray Commonly known as: FLONASE Place 1 spray into both nostrils daily as needed for allergies or rhinitis.   gabapentin 100 MG capsule Commonly known as: NEURONTIN Take 200 mg by mouth daily after supper.   LIFESCAN FINEPOINT LANCETS Misc Use to test for blood sugar three times daily dx: E11.51   metFORMIN 500 MG tablet Commonly known as: GLUCOPHAGE Take 1 tablet (500 mg total) by mouth 2 (two) times daily with a meal.   nitroGLYCERIN 0.4 MG SL tablet Commonly known as: NITROSTAT PLACE 1 TABLET (0.4 MG TOTAL) UNDER THE TONGUE EVERY 5 (FIVE) MINUTES AS NEEDED FOR CHEST PAIN.   ondansetron 4 MG tablet Commonly known as: Zofran Take 1 tablet (4 mg total) by mouth every 8 (eight) hours as needed for nausea or vomiting.   OneTouch Ultra test strip Generic drug: glucose blood USE TO TEST FOR BLOOD SUGAR THREE TIMES DAILY DX: E11.51   pantoprazole 40 MG tablet Commonly known as: PROTONIX Take 40 mg by mouth daily.   Praluent 75 MG/ML Soaj Generic drug: Alirocumab Inject 75 mg into the skin every 14 (fourteen) days.   pramipexole 1 MG tablet Commonly known as: MIRAPEX TAKE 2 TABLETS (2 MG TOTAL) BY MOUTH AT BEDTIME. DX G25.81   valsartan 80 MG tablet Commonly known as: DIOVAN Take 1 tablet (80 mg total) by mouth daily.         Diet and Activity recommendation: See Discharge Instructions above   Consults obtained -  None   Major procedures and Radiology Reports - PLEASE review detailed and final reports for all details, in brief -      DG Chest 2 View  Result Date: 04/08/2019 CLINICAL DATA:   Nonproductive cough for 6 months. EXAM: CHEST - 2 VIEW COMPARISON:  [facility]18/[facility] FINDINGS: The heart size and mediastinal contours are within normal limits. Both lungs are clear. The visualized skeletal structures are unremarkable. IMPRESSION: No active cardiopulmonary disease. Electronically Signed   By: Danae OrleansJohn A Stahl M.D.   On: 04/08/2019 15:28   DG Chest Portable 1 View  Result Date: 04/14/2019 CLINICAL DATA:  Chest tightness, COVID-19 positive EXAM: PORTABLE CHEST 1 VIEW COMPARISON:  Radiograph 09/18/2015 FINDINGS: Multifocal min areas of patchy airspace opacity present throughout both lungs. No pneumothorax or effusion. The cardiomediastinal contours are unremarkable. No acute osseous or soft tissue abnormality. Mild degenerative changes in the shoulders. Cardiac monitoring leads overlie the chest. IMPRESSION: Multifocal airspace disease compatible with atypical pneumonia in the setting of COVID-19 positivity. Electronically Signed   By: Kreg ShropshirePrice  DeHay M.D.   On: 04/14/2019 05:13    Micro Results     Recent Results (from the past 240 hour(s))  Blood Culture (routine x 2)     Status: None (Preliminary result)   Collection Time: 04/14/19  5:40 AM   Specimen: BLOOD  Result Value Ref Range  Status   Specimen Description BLOOD RIGHT ARM  Final   Special Requests   Final    BOTTLES DRAWN AEROBIC AND ANAEROBIC Blood Culture results may not be optimal due to an inadequate volume of blood received in culture bottles   Culture   Final    NO GROWTH 3 DAYS Performed at [facility], [address] [place], [place] [address]    Report Status PENDING  Incomplete  Blood Culture (routine x 2)     Status: None (Preliminary result)   Collection Time: 04/14/19  5:40 AM   Specimen: BLOOD  Result Value Ref Range Status   Specimen Description BLOOD LEFT ARM  Final   Special Requests   Final    BOTTLES DRAWN AEROBIC AND ANAEROBIC Blood Culture results may not be optimal due to  an inadequate volume of blood received in culture bottles   Culture   Final    NO GROWTH 3 DAYS Performed at [facility], [address] [place], [place] [address]    Report Status PENDING  Incomplete  Respiratory Panel by RT PCR (Flu A&B, Covid) - Nasopharyngeal Swab     Status: Abnormal   Collection Time: 04/14/19  6:45 AM   Specimen: Nasopharyngeal Swab  Result Value Ref Range Status   SARS Coronavirus 2 by RT PCR POSITIVE (A) NEGATIVE Final    Comment: RESULT CALLED TO, READ BACK BY AND VERIFIED WITH: MARSHALL RN ON [id number] AT 0846 BY NFIELDS (NOTE) SARS-CoV-2 target nucleic acids are DETECTED. SARS-CoV-2 RNA is generally detectable in upper respiratory specimens  during the acute phase of infection. Positive results are indicative of the presence of the identified virus, but do not rule out bacterial infection or co-infection with other pathogens not detected by the test. Clinical correlation with patient history and other diagnostic information is necessary to determine patient infection status. The expected result is Negative. Fact Sheet for Patients:  https://www.moore.com/https://www.fda.gov/media/142436/download Fact Sheet for Healthcare Providers: https://www.young.biz/https://www.fda.gov/media/142435/download This test is not yet approved or cleared by the [place] FDA and  has been authorized for detection and/or diagnosis of SARS-CoV-2 by FDA under an Emergency Use Authorization (EUA).  This EUA will remain in effect (meaning this test can be us ed) for the duration of  the COVID-19 declaration under Section 564(b)(1) of the Act, 21 U.S.C. section 360bbb-3(b)(1), unless the authorization is terminated or revoked sooner.    Influenza A by PCR NEGATIVE NEGATIVE Final   Influenza B by PCR NEGATIVE NEGATIVE Final    Comment: (NOTE) The Xpert Xpress SARS-CoV-2/FLU/RSV assay is intended as an aid in  the diagnosis of influenza from Nasopharyngeal swab specimens and  should not be used as a sole basis  for treatment. Nasal washings and  aspirates are unacceptable for Xpert Xpress SARS-CoV-2/FLU/RSV  testing. Fact Sheet for Patients: https://www.moore.com/https://www.fda.gov/media/142436/download Fact Sheet for Healthcare Providers: https://www.young.biz/https://www.fda.gov/media/142435/download This test is not yet approved or cleared by the [place] FDA and  has been authorized for detection and/or diagnosis of SARS-CoV-2 by  FDA under an Emergency Use Authorization (EUA). This EUA will remain  in effect (meaning this test can be used) for the duration of the  Covid-19 declaration under Section 564(b)(1) of the Act, 21  U.S.C. section 360bbb-3(b)(1), unless the authorization is  terminated or revoked. Performed at [facility], [address] [place], [place] [address]        Today   Subjective:   Fortino SicRalph Navejas today has no headache,no chest or  abdominal pain,no new weakness tingling or numbness, feels much better wants to go home today.   Objective:   Blood pressure 138/81, pulse 68, temperature 98.4 F (36.9 C), temperature source Oral, resp. rate 16, height 5\' 8"  (1.727 m), weight 78.6 kg, SpO2 90 %.   Intake/Output Summary (Last 24 hours) at 04/18/2019 1129 Last data filed at 04/18/2019 0500 Gross per 24 hour  Intake 846 ml  Output --  Net 846 ml    Exam Awake Alert, Oriented x 3, No new F.N deficits, Normal affect Symmetrical Chest wall movement, Good air movement bilaterally, CTAB RRR,No Gallops,Rubs or new Murmurs, No Parasternal Heave +ve B.Sounds, Abd Soft, Non tender, No organomegaly appriciated, No rebound -guarding or rigidity. No Cyanosis, Clubbing or edema, No new Rash or bruise  Data Review   CBC w Diff:  Lab Results  Component Value Date   WBC 7.2 04/18/2019   HGB 14.9 04/18/2019   HGB 15.0 02/10/2015   HCT 44.6 04/18/2019   HCT 42.5 02/10/2015   PLT 242 04/18/2019   LYMPHOPCT 10 04/18/2019   MONOPCT 7 04/18/2019   EOSPCT 0 04/18/2019   BASOPCT 0 04/18/2019    CMP:   Lab Results  Component Value Date   NA 137 04/18/2019   NA 145 04/10/2016   K 4.5 04/18/2019   CL 101 04/18/2019   CO2 27 04/18/2019   BUN 21 04/18/2019   BUN 15 04/10/2016   CREATININE 1.09 04/18/2019   CREATININE 0.94 03/09/2019   GLU 114 04/10/2016   PROT 6.7 04/18/2019   PROT 6.6 07/03/2015   ALBUMIN 3.0 (L) 04/18/2019   ALBUMIN 4.2 07/03/2015   BILITOT 0.6 04/18/2019   BILITOT 0.3 07/03/2015   ALKPHOS 63 04/18/2019   AST 30 04/18/2019   ALT 43 04/18/2019  .   Total Time in preparing paper work, data evaluation and todays exam - 35 minutes  04/20/2019 M.D on 04/18/2019 at 11:29 AM  [facility]   Office  [phone]

## 2019-04-19 ENCOUNTER — Telehealth: Payer: Self-pay | Admitting: *Deleted

## 2019-04-19 LAB — CULTURE, BLOOD (ROUTINE X 2)
Culture: NO GROWTH
Culture: NO GROWTH

## 2019-04-19 NOTE — Telephone Encounter (Signed)
Kenneth Balo, DO  Dametri Ozburn, [name], CMA  Needs toc

## 2019-04-20 ENCOUNTER — Telehealth (INDEPENDENT_AMBULATORY_CARE_PROVIDER_SITE_OTHER): Payer: Medicare Other | Admitting: Family

## 2019-04-20 ENCOUNTER — Encounter: Payer: Self-pay | Admitting: Family

## 2019-04-20 ENCOUNTER — Other Ambulatory Visit: Payer: Self-pay

## 2019-04-20 DIAGNOSIS — I25118 Atherosclerotic heart disease of native coronary artery with other forms of angina pectoris: Secondary | ICD-10-CM

## 2019-04-20 DIAGNOSIS — I10 Essential (primary) hypertension: Secondary | ICD-10-CM | POA: Diagnosis not present

## 2019-04-20 DIAGNOSIS — G2581 Restless legs syndrome: Secondary | ICD-10-CM

## 2019-04-20 DIAGNOSIS — Z794 Long term (current) use of insulin: Secondary | ICD-10-CM

## 2019-04-20 DIAGNOSIS — E1159 Type 2 diabetes mellitus with other circulatory complications: Secondary | ICD-10-CM

## 2019-04-20 DIAGNOSIS — E1165 Type 2 diabetes mellitus with hyperglycemia: Secondary | ICD-10-CM

## 2019-04-20 DIAGNOSIS — K219 Gastro-esophageal reflux disease without esophagitis: Secondary | ICD-10-CM | POA: Diagnosis not present

## 2019-04-20 DIAGNOSIS — J1282 Pneumonia due to coronavirus disease 2019: Secondary | ICD-10-CM

## 2019-04-20 DIAGNOSIS — IMO0002 Reserved for concepts with insufficient information to code with codable children: Secondary | ICD-10-CM

## 2019-04-20 DIAGNOSIS — U071 COVID-19: Secondary | ICD-10-CM | POA: Diagnosis not present

## 2019-04-20 NOTE — Progress Notes (Signed)
Patient ID: Kenneth Sutch Sr., male   DOB: 11/15/1950, 69 y.o.   MRN: [id number] This service is provided via telemedicine  No vital signs collected/recorded due to the encounter was a telemedicine visit.   Location of patient (ex: home, work):  HOME  Patient consents to a telephone visit:  YES  Location of the provider (ex: office, home):  OFFICE   Name of any referring provider:  TIFFANY REED, DO  Names of all persons participating in the telemedicine service and their role in the encounter:  PATIENT, Edwin Dada, [place], Marlowe Sax, NP  Time spent on call:  4:26  Provider: Sahory Nordling FNP-C  Gayland Curry, DO  Patient Care Team: Gayland Curry, DO as PCP - General (Geriatric Medicine) Adrian Prows, MD as Consulting Physician (Cardiology) Cameron Sprang, MD as Consulting Physician (Neurology) [place], [name], [place] (Optometry) Melida Quitter, MD as Consulting Physician (Otolaryngology)  Extended Emergency Contact Information Primary Emergency Contact: Laiche,Pam Address: [address]          [place], [place] [address] Johnnette Litter of [place] Phone: [phone] Relation: Spouse Secondary Emergency Contact: [place] of [place] Phone: [phone] Relation: Son  Code Status:Full Code  Goals of care: Advanced Directive information Advanced Directives 04/17/2019  Does Patient Have a Medical Advance Directive? No  Does patient want to make changes to medical advance directive? -  Would patient like information on creating a medical advance directive? No - Patient declined  Pre-existing out of facility DNR order (yellow form or pink MOST form) -     Chief Complaint  Patient presents with  . Transitions Of Care    DISCHARGED 04/18/2019    HPI:  Pt is a 69 y.o. male seen today for Transition of care for hospital admission from 04/14/2019 -04/18/2019 for acute Respiratory failure with hypoxia and Pneumonia due to COVID-19 virus.He tested  positive for COVID-19 04/09/2019.He presented to ED with low oxygen saturation 88% on RA was placed on oxygen 2 liters via [place].CXR done showed significant for multifocal pneumonia.He was treated with I.V. Decadron 6 mg tablet,Remdesivir and Tylenol.He was admitted completed x 5 days of remdesivir and Decadron.EKG and Troponin were unremarkable.He had Hypokalemia and was repleted.CRP trended down 9.8> 4.4>3.8 He was discharge home on oral Decadron x 5 more days.  He states still having shortness of breath with exertion,hoarse and feeling tired.Also still has no appetite but tries to eat and stay hydrated.   Type 2 DM - latest Hgb A1C 9.2 states CBG high sometimes he had a toast and Honey yesterday CBG was 350 but mostly in the 150's.Currently on Decadron too that makes his CBG go up.He request script for Glucometer Dexcom to be send to pharmacy.He states current meter not good has to recheck CBG 4-5 x per day. Currently on Metformin 500 mg tablet twice daily with meals and Lantus 40 units SQ at bedtime.States unable to follow heart healthy diet which seems to make he's CBG low.He states prefers to manage his carbohydrates.   Hypertension - no home B/p reading today for review but states usually B/p runs in the 117/70-130's/80's.On Valsartan 80 mg tablet daily and Carvediloll 3.125 mg tablet twice daily.denies any headache,dizziness or chest pain.On ASA 81 mg tablet daily.Nitro 0.4 mg SL tablet for chest pain.   Restless leg Syndrome - states didn't sleep last night due to restless legs.Had to walk around.states Mirapex 2 mg tablet at bedtime sometimes works and at times does not work well.Also on  Gabapentin 200 mg capsule daily.   GERD - symptoms under control on famotidine 20 mg tablet twice daily and Protonix 40 daily.  CAD - denies any chest pain.EKG done 04/14/2019 showed no significant changes.On Carvediloll 3.125 mg tablet twice daily,ASA 81 mg tablet daily.Nitro 0.4 mg SL tablet for chest pain.      Past Medical History:  Diagnosis Date  . Arthritis    "left thumb" (05/23/2017)  . Barrett's esophagus    "we've been told that it's all gone; still take RX for GERD" (05/23/2017)  . CAD (coronary artery disease), native coronary artery    Coronary angiogram 05/03/2014:  Proximal LAD 3.0x12 mm Promus premier stent.  04/08/2014: Mid Cx 3.5 x 16 mm Promus DES.  03/29/2013: Mid LAD 2.75 x 38 mm Promus Premier drug-eluting stent, balloon angioplasty of D1 and distal LAD and stenting of distal RCA with 2.75 x 24 mm promos Premier drug-eluting stent 12/15/2012 widely patent.  . Cervicalgia   . Chronic bronchitis ([place])    "q yr" (05/23/2017)  . Complication of anesthesia    " I do not wake up very well "  . COVID-19   . Family history of adverse reaction to anesthesia    "son w/PONV"  . GERD (gastroesophageal reflux disease)   . Heart murmur    "grew out of it"   . Hyperlipidemia   . Hypoglycemia, unspecified   . IDDM (insulin dependent diabetes mellitus)   . Impotence of organic origin   . Iron deficiency anemia   . Other malaise and fatigue   . PONV (postoperative nausea and vomiting)   . Restless leg   . Tension headache    "sometimes" (05/23/2017)  . Unspecified gastritis and gastroduodenitis with hemorrhage   . Unstable angina pectoris ([place]) 04/08/2014   Coronary angiogram 05/03/2014:  Proximal LAD 3.0x12 mm Promus premier stent.  04/08/2014: Mid Cx 3.5 x 16 mm Promus DES.  03/29/2013: Mid LAD 2.75 x 38 mm Promus Premier drug-eluting stent, balloon angioplasty of D1 and distal LAD and stenting of distal RCA with 2.75 x 24 mm promos Premier drug-eluting stent 12/15/2012 widely patent.   Past Surgical History:  Procedure Laterality Date  . CARDIAC CATHETERIZATION N/A 09/25/2015   Procedure: Left Heart Cath and Coronary Angiography;  Surgeon: Adrian Prows, MD;  Location: [place] [facility];  Service: Cardiovascular;  Laterality: N/A;  . CARDIAC CATHETERIZATION N/A 09/25/2015   Procedure:  Intravascular Pressure Wire/FFR Study;  Surgeon: Adrian Prows, MD;  Location: [place] [facility];  Service: Cardiovascular;  Laterality: N/A;  . CORONARY ANGIOPLASTY WITH STENT PLACEMENT  12/15/2012; 04/28/2014; 05/03/2014   "2; 1; 1"   . FRACTIONAL FLOW RESERVE WIRE Right 03/26/2013   Procedure: FRACTIONAL FLOW RESERVE WIRE;  Surgeon: Laverda Page, MD;  Location[facility] CATH LAB;  Service: Cardiovascular;  Laterality: Right;  . FRACTIONAL FLOW RESERVE WIRE N/A 05/03/2014   Procedure: FRACTIONAL FLOW RESERVE WIRE;  Surgeon: Laverda Page, MD;  Location: [facility] CATH LAB;  Service: Cardiovascular;  Laterality: N/A;  . HERNIA REPAIR  0626   "umbilical"   . INCISION AND DRAINAGE ABSCESS Left 05/2007   "groin"   . KNEE SURGERY Right 1992;  2000   "calcium deposits removed" (12/15/2012)  . LEFT HEART CATH AND CORONARY ANGIOGRAPHY N/A 05/23/2017   Procedure: LEFT HEART CATH AND CORONARY ANGIOGRAPHY;  Surgeon: Adrian Prows, MD;  Location: [place] [facility];  Service: Cardiovascular;  Laterality: N/A;  . LEFT HEART CATHETERIZATION WITH CORONARY ANGIOGRAM N/A 12/15/2012  Procedure: LEFT HEART CATHETERIZATION WITH CORONARY ANGIOGRAM;  Surgeon: Laverda Page, MD;  Location: [facility] CATH LAB;  Service: Cardiovascular;  Laterality: N/A;  . LEFT HEART CATHETERIZATION WITH CORONARY ANGIOGRAM N/A 03/26/2013   Procedure: LEFT HEART CATHETERIZATION WITH CORONARY ANGIOGRAM;  Surgeon: Laverda Page, MD;  Location: [facility] CATH LAB;  Service: Cardiovascular;  Laterality: N/A;  . LEFT HEART CATHETERIZATION WITH CORONARY ANGIOGRAM N/A 04/08/2014   Procedure: LEFT HEART CATHETERIZATION WITH CORONARY ANGIOGRAM;  Surgeon: Blane Ohara, MD;  Location: [facility] CATH LAB;  Service: Cardiovascular;  Laterality: N/A;  . PERCUTANEOUS CORONARY STENT INTERVENTION (PCI-S) N/A 05/03/2014   Procedure: PERCUTANEOUS CORONARY STENT INTERVENTION (PCI-S);  Surgeon: Laverda Page, MD;  Location: [facility] CATH LAB;  Service: Cardiovascular;  Laterality: N/A;   . UMBILICAL HERNIA REPAIR      Allergies  Allergen Reactions  . Lyrica [Pregabalin] Other (See Comments)    Made patient very lethargic the next morning, very hard to patient to function, move, etc.   . Statins Other (See Comments)    Causes Restless Legs, rhabdomyolysis  . Metformin And Related     GI upset  . Trulicity [Dulaglutide] Other (See Comments)    GI side effects     Outpatient Encounter Medications as of 04/20/2019  Medication Sig  . acetaminophen (TYLENOL) 325 MG tablet Take 2 tablets (650 mg total) by mouth every 6 (six) hours as needed for mild pain or headache (fever >/= 101).  Marland Kitchen albuterol (PROVENTIL HFA;VENTOLIN HFA) 108 (90 Base) MCG/ACT inhaler Inhale 2 puffs into the lungs every 6 (six) hours as needed for wheezing or shortness of breath.  . Alirocumab (PRALUENT) 75 MG/ML SOAJ Inject 75 mg into the skin every 14 (fourteen) days.  Marland Kitchen aspirin 81 MG EC tablet Take 1 tablet (81 mg total) by mouth daily.  . BD PEN NEEDLE NANO U/F 32G X 4 MM MISC USE TWICE DAILY WITH LANTUS PEN  . benzonatate (TESSALON) 200 MG capsule Take 200 mg by mouth 3 (three) times daily as needed for cough.  . carvedilol (COREG) 3.125 MG tablet Take 1 tablet (3.125 mg total) by mouth 2 (two) times daily with a meal.  . CVS VITAMIN C 500 MG tablet TAKE 1 TABLET BY MOUTH TWICE DAILY  . D-5000 125 MCG (5000 UT) TABS TAKE 1 TABLET BY MOUTH EVERY DAY  . dexamethasone (DECADRON) 2 MG tablet Take 5 tablets (10 mg total) by mouth daily.  Marland Kitchen dextromethorphan-guaiFENesin (MUCINEX DM) 30-600 MG 12hr tablet Take 1 tablet by mouth 2 (two) times daily as needed for cough.  . famotidine (PEPCID) 20 MG tablet Take 1 tablet (20 mg total) by mouth 2 (two) times daily.  . fluticasone (FLONASE) 50 MCG/ACT nasal spray Place 1 spray into both nostrils daily as needed for allergies or rhinitis.  Marland Kitchen gabapentin (NEURONTIN) 100 MG capsule Take 200 mg by mouth daily after supper.  . Insulin Glargine (BASAGLAR KWIKPEN) 100  UNIT/ML SOPN INJECT 40 UNITS SUBCUTANEOUSLY INTO THE SKIN AT BEDTIME  . LIFESCAN FINEPOINT LANCETS MISC Use to test for blood sugar three times daily dx: E11.51  . metFORMIN (GLUCOPHAGE) 500 MG tablet Take 1 tablet (500 mg total) by mouth 2 (two) times daily with a meal.  . nitroGLYCERIN (NITROSTAT) 0.4 MG SL tablet PLACE 1 TABLET (0.4 MG TOTAL) UNDER THE TONGUE EVERY 5 (FIVE) MINUTES AS NEEDED FOR CHEST PAIN.  Marland Kitchen ondansetron (ZOFRAN) 4 MG tablet Take 1 tablet (4 mg total) by mouth every 8 (eight) hours as needed  for nausea or vomiting.  Glory Rosebush ULTRA test strip USE TO TEST FOR BLOOD SUGAR THREE TIMES DAILY DX: E11.51  . pantoprazole (PROTONIX) 40 MG tablet Take 40 mg by mouth daily.  . pramipexole (MIRAPEX) 1 MG tablet TAKE 2 TABLETS (2 MG TOTAL) BY MOUTH AT BEDTIME. DX G25.81  . valsartan (DIOVAN) 80 MG tablet Take 1 tablet (80 mg total) by mouth daily.   No facility-administered encounter medications on file as of 04/20/2019.    Review of Systems  Constitutional: Positive for fatigue. Negative for appetite change, chills and fever.  HENT: Negative for congestion, rhinorrhea, sinus pressure, sinus pain, sneezing, sore throat and trouble swallowing.        Hoarse voice.  Eyes: Negative for photophobia, pain, discharge, redness and itching.  Respiratory: Positive for cough. Negative for chest tightness and wheezing.        Shortness of breath with exertion   Cardiovascular: Negative for chest pain, palpitations and leg swelling.  Gastrointestinal: Negative for abdominal distention, abdominal pain, constipation, diarrhea, nausea and vomiting.  Endocrine: Negative for cold intolerance, heat intolerance, polydipsia, polyphagia and polyuria.  Genitourinary: Negative for decreased urine volume, difficulty urinating, dysuria, flank pain, frequency and hematuria.  Musculoskeletal: Negative for arthralgias, gait problem and myalgias.  Skin: Negative for color change, pallor and rash.   Neurological: Negative for dizziness, speech difficulty, weakness, light-headedness, numbness and headaches.       RLS   Hematological: Does not bruise/bleed easily.  Psychiatric/Behavioral: Negative for agitation and sleep disturbance. The patient is not nervous/anxious.     Immunization History  Administered Date(s) Administered  . Pneumococcal Conjugate-13 12/01/2013  . Td 03/04/1998, 03/01/2017  . Tdap 03/06/2011, 02/28/2017   Pertinent  Health Maintenance Due  Topic Date Due  . OPHTHALMOLOGY EXAM  03/20/2019  . INFLUENZA VACCINE  10/04/2019 (Originally 10/03/2018)  . PNA vac Low Risk Adult (2 of 2 - PPSV23) 03/14/2020 (Originally 12/02/2014)  . HEMOGLOBIN A1C  09/06/2019  . FOOT EXAM  03/14/2020  . COLONOSCOPY  10/18/2025   Fall Risk  04/08/2019 03/31/2019 03/15/2019 03/02/2019 01/22/2019  Falls in the past year? 0 0 1 1 0  Number falls in past yr: 0 0 0 0 0  Injury with Fall? 0 0 1 0 0   There were no vitals filed for this visit. There is no height or weight on file to calculate BMI. Physical Exam Constitutional:      General: He is not in acute distress.    Appearance: He is not ill-appearing.  Eyes:     General: No scleral icterus.       Right eye: No discharge.        Left eye: No discharge.     Conjunctiva/sclera: Conjunctivae normal.  Pulmonary:     Effort: Pulmonary effort is normal. No respiratory distress.  Skin:    Coloration: Skin is not pale.     Findings: No bruising or erythema.  Neurological:     Mental Status: He is alert and oriented to person, place, and time.     Cranial Nerves: No cranial nerve deficit.     Gait: Gait normal.  Psychiatric:        Mood and Affect: Mood normal.        Behavior: Behavior normal.        Thought Content: Thought content normal.        Judgment: Judgment normal.    Labs reviewed: Recent Labs    04/15/19 0434 04/15/19 0434 04/16/19 0251  04/17/19 0636 04/18/19 0255  NA 136   < > 136 136 137  K 4.6   < > 5.2*  4.9 4.5  CL 104   < > 103 101 101  CO2 23   < > 24 26 27   GLUCOSE 212*   < > 215* 167* 117*  BUN 23   < > 25* 21 21  CREATININE 1.12   < > 1.10 1.08 1.09  CALCIUM 8.4*   < > 8.6* 8.8* 9.2  MG 2.1  --  2.4  --   --   PHOS 2.5  --  3.5  --   --    < > = values in this interval not displayed.   Recent Labs    04/16/19 0251 04/17/19 0636 04/18/19 0255  AST 17 28 30   ALT 20 31 43  ALKPHOS 61 52 63  BILITOT 0.8 0.6 0.6  PROT 6.2* 6.3* 6.7  ALBUMIN 2.8* 2.7* 3.0*   Recent Labs    04/16/19 0251 04/17/19 0636 04/18/19 0255  WBC 7.9 7.0 7.2  NEUTROABS 6.5 5.7 5.9  HGB 14.0 13.9 14.9  HCT 41.1 41.0 44.6  MCV 88.2 88.4 90.1  PLT 200 203 242   Lab Results  Component Value Date   TSH 2.65 10/09/2015   Lab Results  Component Value Date   HGBA1C 9.2 (H) 03/09/2019   Lab Results  Component Value Date   CHOL 166 03/09/2019   HDL 37 (L) 03/09/2019   LDLCALC 103 (H) 03/09/2019   TRIG 41 04/14/2019   CHOLHDL 4.5 03/09/2019    Significant Diagnostic Results in last 30 days:  DG Chest 2 View  Result Date: 04/08/2019 CLINICAL DATA:  Nonproductive cough for 6 months. EXAM: CHEST - 2 VIEW COMPARISON:  09/18/2015 from [place]: The heart size and mediastinal contours are within normal limits. Both lungs are clear. The visualized skeletal structures are unremarkable. IMPRESSION: No active cardiopulmonary disease. Electronically Signed   By: Marlaine Hind M.D.   On: 04/08/2019 15:28   DG Chest Portable 1 View  Result Date: 04/14/2019 CLINICAL DATA:  Chest tightness, COVID-19 positive EXAM: PORTABLE CHEST 1 VIEW COMPARISON:  Radiograph 09/18/2015 FINDINGS: Multifocal min areas of patchy airspace opacity present throughout both lungs. No pneumothorax or effusion. The cardiomediastinal contours are unremarkable. No acute osseous or soft tissue abnormality. Mild degenerative changes in the shoulders. Cardiac monitoring leads overlie the chest. IMPRESSION: Multifocal  airspace disease compatible with atypical pneumonia in the setting of COVID-19 positivity. Electronically Signed   By: Lovena Le M.D.   On: 04/14/2019 05:13    Assessment/Plan 1. Pneumonia due to COVID-19 virus Reports no fever.Still  Has a cough and fatigue . - continue on decadron  for 4 more days as directed from Hospital. - continue on benzonatate 200 mg capsule three times as needed for cough   - Advised to notify provider or go to ED if symptoms worsen or fail to improve.  - CMP with eGFR([facility]); Future - CBC with Differential/Platelet; Future  2. Uncontrolled diabetes mellitus with circulatory complication, with long-term current use of insulin (HCC) Lab Results  Component Value Date   HGBA1C 9.2 (H) 03/09/2019  Reports some low and high CBG though currently on Decadron x 4 days due to COVID-19  Continue on metformin 1000 mg twice daily with meals.On Gabapentin for neuropathy.continue on ASA,ARB and BB   3. Essential hypertension Continue on Valsartan 80 mg tablet daily and Carvediloll 3.125 mg tablet twice daily.On  ASA 81 mg tablet daily.Nitro 0.4 mg SL tablet for chest pain.  - CMP with eGFR([facility]); Future - CBC with Differential/Platelet; Future  4. Coronary artery disease involving native coronary artery of native heart with other form of angina pectoris (HCC) Chest pain free.Had bradycardia during Hospital admission Coreg dose  Reduced by half.continue on Carvediloll 3.125 mg tablet twice daily and Valsartan 80 mg tablet daily.On ASA 81 mg tablet daily.Nitro 0.4 mg SL tablet for chest pain.   5. Gastroesophageal reflux disease without esophagitis Asymptomatic.on famotidine 20 mg tablet twice daily and Protonix 40 daily.  6. Restless Leg syndrome  Continue on pramipexole 2 mg tablet at bedtime andGabapentin 200 mg capsule.  - encouraged to exercise.  Family/ staff Communication: Reviewed plan of care with patient verbalized understanding.  Labs/tests ordered:  - CMP  with eGFR([facility]); Future - CBC with Differential/Platelet; Future  Next Appointment: Has upcoming appointment 06/14/2019   Spent 31 minutes ( 1: 26 pm - 1:57 Pm ) of face to face video with patient   Sandrea Hughs, NP

## 2019-04-20 NOTE — Telephone Encounter (Signed)
Transition Care Management Follow-up Telephone Call  Date of discharge and from where: 02/14/21[facility]  How have you been since you were released from the hospital? "Making it"   Any questions or concerns? No   Items Reviewed:  Did the pt receive and understand the discharge instructions provided? Yes   Medications obtained and verified? Yes   Any new allergies since your discharge? No   Dietary orders reviewed? Yes  Do you have support at home? Yes  wife  Other (ie: DME, Home Health, etc) No  Functional Questionnaire: (I = Independent and D = Dependent) ADL's: I  Bathing/Dressing- I   Meal Prep- I  Eating- I  Maintaining continence- I  Transferring/Ambulation- I  Managing Meds- I   Follow up appointments reviewed:    PCP Hospital f/u appt confirmed? Yes  Scheduled to see Dinah on 04/20/19 .  Specialist Hospital f/u appt confirmed? No    Are transportation arrangements needed? No   If their condition worsens, is the pt aware to call  their PCP or go to the ED? Yes  Was the patient provided with contact information for the PCP's office or ED? Yes  Was the pt encouraged to call back with questions or concerns? Yes

## 2019-04-22 ENCOUNTER — Telehealth: Payer: Self-pay | Admitting: Nurse Practitioner

## 2019-04-22 ENCOUNTER — Other Ambulatory Visit: Payer: Self-pay | Admitting: Nurse Practitioner

## 2019-04-22 DIAGNOSIS — E1159 Type 2 diabetes mellitus with other circulatory complications: Secondary | ICD-10-CM

## 2019-04-22 DIAGNOSIS — IMO0002 Reserved for concepts with insufficient information to code with codable children: Secondary | ICD-10-CM

## 2019-04-22 MED ORDER — METFORMIN HCL 1000 MG PO TABS: 1000.0000 mg | ORAL_TABLET | Freq: Two times a day (BID) | ORAL | 0 refills | Status: DC

## 2019-04-22 NOTE — Telephone Encounter (Signed)
Pt called on 2/18 while office closed due to elevated blood sugars in the 400s since being discharged from hospital after COVID admission. He is taking steroids for 3 additional days. Has modified diet to get blood sugar down but not working. Per hospital discharge summary blood sugars were uncontrolled and he was on tradjenta during hospital stay however he was discharged on home regimen. He is taking meformin 500 mg BID and basaglar 40 units daily.  He states he could increase metformin to 1000 units BID and follow blood sugars. (takes with food and stomach able to tolerate) Will continue basglar at current dose and have him follow up next week- will need a mychart/virtual visit for hospital follow up.  msg sent to Dr Renato Gails for Lorain Childes and Synetta Fail to call and schedule visit please.

## 2019-04-22 NOTE — Telephone Encounter (Signed)
Patient scheduled for MyChart Video Visit with Dinah on Tuesday 2/23.

## 2019-04-22 NOTE — Patient Instructions (Addendum)
- continue on decadron as directed from hospital  - Notify provider or go to ED if symptoms worsen or fail to improve.  COVID-19 COVID-19 is a respiratory infection that is caused by a virus called severe acute respiratory syndrome coronavirus 2 (SARS-CoV-2). The disease is also known as coronavirus disease or novel coronavirus. In some people, the virus may not cause any symptoms. In others, it may cause a serious infection. The infection can get worse quickly and can lead to complications, such as:  Pneumonia, or infection of the lungs.  Acute respiratory distress syndrome or ARDS. This is a condition in which fluid build-up in the lungs prevents the lungs from filling with air and passing oxygen into the blood.  Acute respiratory failure. This is a condition in which there is not enough oxygen passing from the lungs to the body or when carbon dioxide is not passing from the lungs out of the body.  Sepsis or septic shock. This is a serious bodily reaction to an infection.  Blood clotting problems.  Secondary infections due to bacteria or fungus.  Organ failure. This is when your body's organs stop working. The virus that causes COVID-19 is contagious. This means that it can spread from person to person through droplets from coughs and sneezes (respiratory secretions). What are the causes? This illness is caused by a virus. You may catch the virus by:  Breathing in droplets from an infected person. Droplets can be spread by a person breathing, speaking, singing, coughing, or sneezing.  Touching something, like a table or a doorknob, that was exposed to the virus (contaminated) and then touching your mouth, nose, or eyes. What increases the risk? Risk for infection You are more likely to be infected with this virus if you:  Are within 6 feet (2 meters) of a person with COVID-19.  Provide care for or live with a person who is infected with COVID-19.  Spend time in crowded indoor  spaces or live in shared housing. Risk for serious illness You are more likely to become seriously ill from the virus if you:  Are 3 years of age or older. The higher your age, the more you are at risk for serious illness.  Live in a nursing home or long-term care facility.  Have cancer.  Have a long-term (chronic) disease such as: ? Chronic lung disease, including chronic obstructive pulmonary disease or asthma. ? A long-term disease that lowers your body's ability to fight infection (immunocompromised). ? Heart disease, including heart failure, a condition in which the arteries that lead to the heart become narrow or blocked (coronary artery disease), a disease which makes the heart muscle thick, weak, or stiff (cardiomyopathy). ? Diabetes. ? Chronic kidney disease. ? Sickle cell disease, a condition in which red blood cells have an abnormal "sickle" shape. ? Liver disease.  Are obese. What are the signs or symptoms? Symptoms of this condition can range from mild to severe. Symptoms may appear any time from 2 to 14 days after being exposed to the virus. They include:  A fever or chills.  A cough.  Difficulty breathing.  Headaches, body aches, or muscle aches.  Runny or stuffy (congested) nose.  A sore throat.  New loss of taste or smell. Some people may also have stomach problems, such as nausea, vomiting, or diarrhea. Other people may not have any symptoms of COVID-19. How is this diagnosed? This condition may be diagnosed based on:  Your signs and symptoms, especially if: ? You  live in an area with a COVID-19 outbreak. ? You recently traveled to or from an area where the virus is common. ? You provide care for or live with a person who was diagnosed with COVID-19. ? You were exposed to a person who was diagnosed with COVID-19.  A physical exam.  Lab tests, which may include: ? Taking a sample of fluid from the back of your nose and throat (nasopharyngeal fluid),  your nose, or your throat using a swab. ? A sample of mucus from your lungs (sputum). ? Blood tests.  Imaging tests, which may include, X-rays, CT scan, or ultrasound. How is this treated? At present, there is no medicine to treat COVID-19. Medicines that treat other diseases are being used on a trial basis to see if they are effective against COVID-19. Your health care provider will talk with you about ways to treat your symptoms. For most people, the infection is mild and can be managed at home with rest, fluids, and over-the-counter medicines. Treatment for a serious infection usually takes places in a hospital intensive care unit (ICU). It may include one or more of the following treatments. These treatments are given until your symptoms improve.  Receiving fluids and medicines through an IV.  Supplemental oxygen. Extra oxygen is given through a tube in the nose, a face mask, or a hood.  Positioning you to lie on your stomach (prone position). This makes it easier for oxygen to get into the lungs.  Continuous positive airway pressure (CPAP) or bi-level positive airway pressure (BPAP) machine. This treatment uses mild air pressure to keep the airways open. A tube that is connected to a motor delivers oxygen to the body.  Ventilator. This treatment moves air into and out of the lungs by using a tube that is placed in your windpipe.  Tracheostomy. This is a procedure to create a hole in the neck so that a breathing tube can be inserted.  Extracorporeal membrane oxygenation (ECMO). This procedure gives the lungs a chance to recover by taking over the functions of the heart and lungs. It supplies oxygen to the body and removes carbon dioxide. Follow these instructions at home: Lifestyle  If you are sick, stay home except to get medical care. Your health care provider will tell you how long to stay home. Call your health care provider before you go for medical care.  Rest at home as told by  your health care provider.  Do not use any products that contain nicotine or tobacco, such as cigarettes, e-cigarettes, and chewing tobacco. If you need help quitting, ask your health care provider.  Return to your normal activities as told by your health care provider. Ask your health care provider what activities are safe for you. General instructions  Take over-the-counter and prescription medicines only as told by your health care provider.  Drink enough fluid to keep your urine pale yellow.  Keep all follow-up visits as told by your health care provider. This is important. How is this prevented?  There is no vaccine to help prevent COVID-19 infection. However, there are steps you can take to protect yourself and others from this virus. To protect yourself:   Do not travel to areas where COVID-19 is a risk. The areas where COVID-19 is reported change often. To identify high-risk areas and travel restrictions, check the CDC travel website: FatFares.com.br  If you live in, or must travel to, an area where COVID-19 is a risk, take precautions to avoid infection. ?  Stay away from people who are sick. ? Wash your hands often with soap and water for 20 seconds. If soap and water are not available, use an alcohol-based hand sanitizer. ? Avoid touching your mouth, face, eyes, or nose. ? Avoid going out in public, follow guidance from your state and local health authorities. ? If you must go out in public, wear a cloth face covering or face mask. Make sure your mask covers your nose and mouth. ? Avoid crowded indoor spaces. Stay at least 6 feet (2 meters) away from others. ? Disinfect objects and surfaces that are frequently touched every day. This may include:  Counters and tables.  Doorknobs and light switches.  Sinks and faucets.  Electronics, such as phones, remote controls, keyboards, computers, and tablets. To protect others: If you have symptoms of COVID-19, take  steps to prevent the virus from spreading to others.  If you think you have a COVID-19 infection, contact your health care provider right away. Tell your health care team that you think you may have a COVID-19 infection.  Stay home. Leave your house only to seek medical care. Do not use public transport.  Do not travel while you are sick.  Wash your hands often with soap and water for 20 seconds. If soap and water are not available, use alcohol-based hand sanitizer.  Stay away from other members of your household. Let healthy household members care for children and pets, if possible. If you have to care for children or pets, wash your hands often and wear a mask. If possible, stay in your own room, separate from others. Use a different bathroom.  Make sure that all people in your household wash their hands well and often.  Cough or sneeze into a tissue or your sleeve or elbow. Do not cough or sneeze into your hand or into the air.  Wear a cloth face covering or face mask. Make sure your mask covers your nose and mouth. Where to find more information  Centers for Disease Control and Prevention: StickerEmporium.tn  World Health Organization: https://thompson-craig.com/ Contact a health care provider if:  You live in or have traveled to an area where COVID-19 is a risk and you have symptoms of the infection.  You have had contact with someone who has COVID-19 and you have symptoms of the infection. Get help right away if:  You have trouble breathing.  You have pain or pressure in your chest.  You have confusion.  You have bluish lips and fingernails.  You have difficulty waking from sleep.  You have symptoms that get worse. These symptoms may represent a serious problem that is an emergency. Do not wait to see if the symptoms will go away. Get medical help right away. Call your local emergency services (911 in the [place] Do not drive yourself  to the hospital. Let the emergency medical personnel know if you think you have COVID-19. Summary  COVID-19 is a respiratory infection that is caused by a virus. It is also known as coronavirus disease or novel coronavirus. It can cause serious infections, such as pneumonia, acute respiratory distress syndrome, acute respiratory failure, or sepsis.  The virus that causes COVID-19 is contagious. This means that it can spread from person to person through droplets from breathing, speaking, singing, coughing, or sneezing.  You are more likely to develop a serious illness if you are 6 years of age or older, have a weak immune system, live in a nursing home, or have chronic  disease.  There is no medicine to treat COVID-19. Your health care provider will talk with you about ways to treat your symptoms.  Take steps to protect yourself and others from infection. Wash your hands often and disinfect objects and surfaces that are frequently touched every day. Stay away from people who are sick and wear a mask if you are sick. This information is not intended to replace advice given to you by your health care provider. Make sure you discuss any questions you have with your health care provider. Document Revised: 12/18/2018 Document Reviewed: 03/26/2018 Elsevier Patient Education  2020 [employer].

## 2019-04-24 ENCOUNTER — Emergency Department (HOSPITAL_COMMUNITY)
Admission: EM | Admit: 2019-04-24 | Discharge: 2019-04-25 | Disposition: A | Discharge status: Home / Self Care | Payer: Medicare Other | Source: Home / Self Care | Attending: Emergency Medicine | Admitting: Emergency Medicine

## 2019-04-24 ENCOUNTER — Emergency Department (HOSPITAL_COMMUNITY): Payer: Medicare Other

## 2019-04-24 DIAGNOSIS — Z7982 Long term (current) use of aspirin: Secondary | ICD-10-CM | POA: Insufficient documentation

## 2019-04-24 DIAGNOSIS — R5381 Other malaise: Secondary | ICD-10-CM | POA: Diagnosis not present

## 2019-04-24 DIAGNOSIS — J9601 Acute respiratory failure with hypoxia: Secondary | ICD-10-CM | POA: Diagnosis present

## 2019-04-24 DIAGNOSIS — N179 Acute kidney failure, unspecified: Secondary | ICD-10-CM | POA: Diagnosis present

## 2019-04-24 DIAGNOSIS — R0789 Other chest pain: Secondary | ICD-10-CM | POA: Diagnosis not present

## 2019-04-24 DIAGNOSIS — D509 Iron deficiency anemia, unspecified: Secondary | ICD-10-CM | POA: Diagnosis present

## 2019-04-24 DIAGNOSIS — Z79899 Other long term (current) drug therapy: Secondary | ICD-10-CM | POA: Diagnosis not present

## 2019-04-24 DIAGNOSIS — E7800 Pure hypercholesterolemia, unspecified: Secondary | ICD-10-CM | POA: Diagnosis not present

## 2019-04-24 DIAGNOSIS — I2510 Atherosclerotic heart disease of native coronary artery without angina pectoris: Secondary | ICD-10-CM | POA: Diagnosis not present

## 2019-04-24 DIAGNOSIS — U071 COVID-19: Secondary | ICD-10-CM | POA: Diagnosis not present

## 2019-04-24 DIAGNOSIS — N390 Urinary tract infection, site not specified: Secondary | ICD-10-CM | POA: Diagnosis not present

## 2019-04-24 DIAGNOSIS — I10 Essential (primary) hypertension: Secondary | ICD-10-CM | POA: Diagnosis not present

## 2019-04-24 DIAGNOSIS — J159 Unspecified bacterial pneumonia: Secondary | ICD-10-CM | POA: Diagnosis present

## 2019-04-24 DIAGNOSIS — Y95 Nosocomial condition: Secondary | ICD-10-CM | POA: Diagnosis present

## 2019-04-24 DIAGNOSIS — R079 Chest pain, unspecified: Secondary | ICD-10-CM | POA: Diagnosis not present

## 2019-04-24 DIAGNOSIS — Z7401 Bed confinement status: Secondary | ICD-10-CM | POA: Diagnosis not present

## 2019-04-24 DIAGNOSIS — I25110 Atherosclerotic heart disease of native coronary artery with unstable angina pectoris: Secondary | ICD-10-CM | POA: Diagnosis present

## 2019-04-24 DIAGNOSIS — I491 Atrial premature depolarization: Secondary | ICD-10-CM | POA: Diagnosis not present

## 2019-04-24 DIAGNOSIS — M940 Chondrocostal junction syndrome [Tietze]: Secondary | ICD-10-CM

## 2019-04-24 DIAGNOSIS — E118 Type 2 diabetes mellitus with unspecified complications: Secondary | ICD-10-CM | POA: Diagnosis not present

## 2019-04-24 DIAGNOSIS — E119 Type 2 diabetes mellitus without complications: Secondary | ICD-10-CM | POA: Insufficient documentation

## 2019-04-24 DIAGNOSIS — I25118 Atherosclerotic heart disease of native coronary artery with other forms of angina pectoris: Secondary | ICD-10-CM | POA: Diagnosis not present

## 2019-04-24 DIAGNOSIS — R918 Other nonspecific abnormal finding of lung field: Secondary | ICD-10-CM | POA: Diagnosis not present

## 2019-04-24 DIAGNOSIS — K2971 Gastritis, unspecified, with bleeding: Secondary | ICD-10-CM | POA: Diagnosis not present

## 2019-04-24 DIAGNOSIS — Z888 Allergy status to other drugs, medicaments and biological substances status: Secondary | ICD-10-CM | POA: Diagnosis not present

## 2019-04-24 DIAGNOSIS — M2550 Pain in unspecified joint: Secondary | ICD-10-CM | POA: Diagnosis not present

## 2019-04-24 DIAGNOSIS — K2901 Acute gastritis with bleeding: Secondary | ICD-10-CM | POA: Diagnosis not present

## 2019-04-24 DIAGNOSIS — E785 Hyperlipidemia, unspecified: Secondary | ICD-10-CM | POA: Diagnosis present

## 2019-04-24 DIAGNOSIS — J1282 Pneumonia due to coronavirus disease 2019: Secondary | ICD-10-CM | POA: Diagnosis present

## 2019-04-24 DIAGNOSIS — G2581 Restless legs syndrome: Secondary | ICD-10-CM | POA: Diagnosis not present

## 2019-04-24 DIAGNOSIS — E1152 Type 2 diabetes mellitus with diabetic peripheral angiopathy with gangrene: Secondary | ICD-10-CM | POA: Insufficient documentation

## 2019-04-24 DIAGNOSIS — Z794 Long term (current) use of insulin: Secondary | ICD-10-CM | POA: Diagnosis not present

## 2019-04-24 DIAGNOSIS — N289 Disorder of kidney and ureter, unspecified: Secondary | ICD-10-CM | POA: Diagnosis not present

## 2019-04-24 DIAGNOSIS — R0602 Shortness of breath: Secondary | ICD-10-CM | POA: Diagnosis not present

## 2019-04-24 DIAGNOSIS — R6521 Severe sepsis with septic shock: Secondary | ICD-10-CM | POA: Diagnosis present

## 2019-04-24 DIAGNOSIS — R0902 Hypoxemia: Secondary | ICD-10-CM | POA: Diagnosis not present

## 2019-04-24 DIAGNOSIS — Z955 Presence of coronary angioplasty implant and graft: Secondary | ICD-10-CM | POA: Diagnosis not present

## 2019-04-24 DIAGNOSIS — D696 Thrombocytopenia, unspecified: Secondary | ICD-10-CM | POA: Diagnosis not present

## 2019-04-24 DIAGNOSIS — E860 Dehydration: Secondary | ICD-10-CM | POA: Diagnosis not present

## 2019-04-24 DIAGNOSIS — K2270 Barrett's esophagus without dysplasia: Secondary | ICD-10-CM | POA: Diagnosis present

## 2019-04-24 DIAGNOSIS — E871 Hypo-osmolality and hyponatremia: Secondary | ICD-10-CM | POA: Diagnosis not present

## 2019-04-24 DIAGNOSIS — E1165 Type 2 diabetes mellitus with hyperglycemia: Secondary | ICD-10-CM | POA: Diagnosis not present

## 2019-04-24 DIAGNOSIS — J189 Pneumonia, unspecified organism: Secondary | ICD-10-CM | POA: Diagnosis not present

## 2019-04-24 DIAGNOSIS — A419 Sepsis, unspecified organism: Secondary | ICD-10-CM | POA: Diagnosis not present

## 2019-04-24 NOTE — ED Triage Notes (Signed)
Pt coming with complaints of CP on left side for 1 hour. COVID+ and was recently D/C'd 3 days ago. Dx'ed with pneumonia. Hx of 4 stents placed back in 2017. Received 324mg  ASA and fluids with EMS. BP 98/60, 97% on RA, RR 22, HR 82. CBG 302 but pt just finished last dose of prednisone this evening

## 2019-04-25 ENCOUNTER — Inpatient Hospital Stay (HOSPITAL_COMMUNITY)
Admission: EM | Admit: 2019-04-25 | Discharge: 2019-05-01 | DRG: 871 | Disposition: A | Discharge status: Home / Self Care | Payer: Medicare Other | Attending: Internal Medicine | Admitting: Internal Medicine

## 2019-04-25 ENCOUNTER — Emergency Department (HOSPITAL_COMMUNITY): Payer: Medicare Other

## 2019-04-25 ENCOUNTER — Other Ambulatory Visit: Payer: Self-pay

## 2019-04-25 DIAGNOSIS — R6521 Severe sepsis with septic shock: Secondary | ICD-10-CM | POA: Diagnosis present

## 2019-04-25 DIAGNOSIS — Y95 Nosocomial condition: Secondary | ICD-10-CM | POA: Diagnosis present

## 2019-04-25 DIAGNOSIS — E119 Type 2 diabetes mellitus without complications: Secondary | ICD-10-CM | POA: Diagnosis present

## 2019-04-25 DIAGNOSIS — J189 Pneumonia, unspecified organism: Secondary | ICD-10-CM | POA: Diagnosis present

## 2019-04-25 DIAGNOSIS — I491 Atrial premature depolarization: Secondary | ICD-10-CM | POA: Diagnosis not present

## 2019-04-25 DIAGNOSIS — N289 Disorder of kidney and ureter, unspecified: Secondary | ICD-10-CM

## 2019-04-25 DIAGNOSIS — U071 COVID-19: Secondary | ICD-10-CM | POA: Diagnosis present

## 2019-04-25 DIAGNOSIS — R0789 Other chest pain: Secondary | ICD-10-CM | POA: Diagnosis not present

## 2019-04-25 DIAGNOSIS — R0902 Hypoxemia: Secondary | ICD-10-CM | POA: Diagnosis not present

## 2019-04-25 DIAGNOSIS — I200 Unstable angina: Secondary | ICD-10-CM | POA: Diagnosis present

## 2019-04-25 DIAGNOSIS — A419 Sepsis, unspecified organism: Principal | ICD-10-CM | POA: Diagnosis present

## 2019-04-25 DIAGNOSIS — J1282 Pneumonia due to coronavirus disease 2019: Secondary | ICD-10-CM | POA: Diagnosis present

## 2019-04-25 DIAGNOSIS — I25110 Atherosclerotic heart disease of native coronary artery with unstable angina pectoris: Secondary | ICD-10-CM | POA: Diagnosis present

## 2019-04-25 DIAGNOSIS — D696 Thrombocytopenia, unspecified: Secondary | ICD-10-CM | POA: Diagnosis present

## 2019-04-25 DIAGNOSIS — R079 Chest pain, unspecified: Secondary | ICD-10-CM | POA: Diagnosis not present

## 2019-04-25 DIAGNOSIS — N390 Urinary tract infection, site not specified: Secondary | ICD-10-CM | POA: Diagnosis present

## 2019-04-25 DIAGNOSIS — I10 Essential (primary) hypertension: Secondary | ICD-10-CM | POA: Diagnosis present

## 2019-04-25 DIAGNOSIS — J159 Unspecified bacterial pneumonia: Secondary | ICD-10-CM | POA: Diagnosis present

## 2019-04-25 DIAGNOSIS — I2510 Atherosclerotic heart disease of native coronary artery without angina pectoris: Secondary | ICD-10-CM | POA: Diagnosis present

## 2019-04-25 DIAGNOSIS — I25118 Atherosclerotic heart disease of native coronary artery with other forms of angina pectoris: Secondary | ICD-10-CM | POA: Diagnosis present

## 2019-04-25 DIAGNOSIS — E1165 Type 2 diabetes mellitus with hyperglycemia: Secondary | ICD-10-CM | POA: Diagnosis present

## 2019-04-25 DIAGNOSIS — K2270 Barrett's esophagus without dysplasia: Secondary | ICD-10-CM | POA: Diagnosis present

## 2019-04-25 DIAGNOSIS — E871 Hypo-osmolality and hyponatremia: Secondary | ICD-10-CM | POA: Diagnosis present

## 2019-04-25 DIAGNOSIS — E785 Hyperlipidemia, unspecified: Secondary | ICD-10-CM | POA: Diagnosis present

## 2019-04-25 DIAGNOSIS — Z794 Long term (current) use of insulin: Secondary | ICD-10-CM

## 2019-04-25 DIAGNOSIS — K2971 Gastritis, unspecified, with bleeding: Secondary | ICD-10-CM | POA: Diagnosis present

## 2019-04-25 DIAGNOSIS — K209 Esophagitis, unspecified without bleeding: Secondary | ICD-10-CM | POA: Diagnosis present

## 2019-04-25 DIAGNOSIS — Z955 Presence of coronary angioplasty implant and graft: Secondary | ICD-10-CM

## 2019-04-25 DIAGNOSIS — E860 Dehydration: Secondary | ICD-10-CM

## 2019-04-25 DIAGNOSIS — J9601 Acute respiratory failure with hypoxia: Secondary | ICD-10-CM | POA: Diagnosis present

## 2019-04-25 DIAGNOSIS — Z888 Allergy status to other drugs, medicaments and biological substances status: Secondary | ICD-10-CM

## 2019-04-25 DIAGNOSIS — D509 Iron deficiency anemia, unspecified: Secondary | ICD-10-CM | POA: Diagnosis present

## 2019-04-25 DIAGNOSIS — N179 Acute kidney failure, unspecified: Secondary | ICD-10-CM

## 2019-04-25 DIAGNOSIS — Z79899 Other long term (current) drug therapy: Secondary | ICD-10-CM

## 2019-04-25 DIAGNOSIS — R0602 Shortness of breath: Secondary | ICD-10-CM | POA: Diagnosis not present

## 2019-04-25 DIAGNOSIS — G2581 Restless legs syndrome: Secondary | ICD-10-CM | POA: Diagnosis present

## 2019-04-25 DIAGNOSIS — IMO0002 Reserved for concepts with insufficient information to code with codable children: Secondary | ICD-10-CM | POA: Diagnosis present

## 2019-04-25 LAB — CBC WITH DIFFERENTIAL/PLATELET
Abs Immature Granulocytes: 0.16 10*3/uL — ABNORMAL HIGH (ref 0.00–0.07)
Basophils Absolute: 0 10*3/uL (ref 0.0–0.1)
Basophils Relative: 0 %
Eosinophils Absolute: 0 10*3/uL (ref 0.0–0.5)
Eosinophils Relative: 0 %
HCT: 39.6 % (ref 39.0–52.0)
Hemoglobin: 13.4 g/dL (ref 13.0–17.0)
Immature Granulocytes: 1 %
Lymphocytes Relative: 3 %
Lymphs Abs: 0.5 10*3/uL — ABNORMAL LOW (ref 0.7–4.0)
MCH: 30.2 pg (ref 26.0–34.0)
MCHC: 33.8 g/dL (ref 30.0–36.0)
MCV: 89.4 fL (ref 80.0–100.0)
Monocytes Absolute: 0.6 10*3/uL (ref 0.1–1.0)
Monocytes Relative: 4 %
Neutro Abs: 14.1 10*3/uL — ABNORMAL HIGH (ref 1.7–7.7)
Neutrophils Relative %: 92 %
Platelets: 71 10*3/uL — ABNORMAL LOW (ref 150–400)
RBC: 4.43 MIL/uL (ref 4.22–5.81)
RDW: 13.1 % (ref 11.5–15.5)
WBC: 15.4 10*3/uL — ABNORMAL HIGH (ref 4.0–10.5)
nRBC: 0 % (ref 0.0–0.2)

## 2019-04-25 LAB — COMPREHENSIVE METABOLIC PANEL
ALT: 23 U/L (ref 0–44)
ALT: 19 U/L (ref 0–44)
AST: 16 U/L (ref 15–41)
AST: 14 U/L — ABNORMAL LOW (ref 15–41)
Albumin: 2.6 g/dL — ABNORMAL LOW (ref 3.5–5.0)
Albumin: 2.1 g/dL — ABNORMAL LOW (ref 3.5–5.0)
Alkaline Phosphatase: 42 U/L (ref 38–126)
Alkaline Phosphatase: 42 U/L (ref 38–126)
Anion gap: 8 (ref 5–15)
Anion gap: 11 (ref 5–15)
BUN: 30 mg/dL — ABNORMAL HIGH (ref 8–23)
BUN: 24 mg/dL — ABNORMAL HIGH (ref 8–23)
CO2: 25 mmol/L (ref 22–32)
CO2: 22 mmol/L (ref 22–32)
Calcium: 7.9 mg/dL — ABNORMAL LOW (ref 8.9–10.3)
Calcium: 7.8 mg/dL — ABNORMAL LOW (ref 8.9–10.3)
Chloride: 99 mmol/L (ref 98–111)
Chloride: 99 mmol/L (ref 98–111)
Creatinine, Ser: 1.35 mg/dL — ABNORMAL HIGH (ref 0.61–1.24)
Creatinine, Ser: 1.35 mg/dL — ABNORMAL HIGH (ref 0.61–1.24)
GFR calc Af Amer: >60 mL/min (ref >60)
GFR calc Af Amer: >60 mL/min (ref >60)
GFR calc non Af Amer: 53 mL/min — ABNORMAL LOW (ref >60)
GFR calc non Af Amer: 53 mL/min — ABNORMAL LOW (ref >60)
Glucose, Bld: 111 mg/dL — ABNORMAL HIGH (ref 70–99)
Glucose, Bld: 158 mg/dL — ABNORMAL HIGH (ref 70–99)
Potassium: 4.2 mmol/L (ref 3.5–5.1)
Potassium: 3.8 mmol/L (ref 3.5–5.1)
Sodium: 132 mmol/L — ABNORMAL LOW (ref 135–145)
Sodium: 132 mmol/L — ABNORMAL LOW (ref 135–145)
Total Bilirubin: 1.1 mg/dL (ref 0.3–1.2)
Total Bilirubin: 1.1 mg/dL (ref 0.3–1.2)
Total Protein: 5.2 g/dL — ABNORMAL LOW (ref 6.5–8.1)
Total Protein: 4.8 g/dL — ABNORMAL LOW (ref 6.5–8.1)

## 2019-04-25 LAB — CBC
HCT: 40.6 % (ref 39.0–52.0)
Hemoglobin: 13.7 g/dL (ref 13.0–17.0)
MCH: 30.5 pg (ref 26.0–34.0)
MCHC: 33.7 g/dL (ref 30.0–36.0)
MCV: 90.4 fL (ref 80.0–100.0)
Platelets: 92 10*3/uL — ABNORMAL LOW (ref 150–400)
RBC: 4.49 MIL/uL (ref 4.22–5.81)
RDW: 12.6 % (ref 11.5–15.5)
WBC: 12.8 10*3/uL — ABNORMAL HIGH (ref 4.0–10.5)
nRBC: 0.2 % (ref 0.0–0.2)

## 2019-04-25 LAB — LACTIC ACID, PLASMA: Lactic Acid, Venous: 2.6 mmol/L (ref 0.5–1.9)

## 2019-04-25 LAB — I-STAT CHEM 8, ED
BUN: 38 mg/dL — ABNORMAL HIGH (ref 8–23)
Calcium, Ion: 1.03 mmol/L — ABNORMAL LOW (ref 1.15–1.40)
Chloride: 96 mmol/L — ABNORMAL LOW (ref 98–111)
Creatinine, Ser: 1.2 mg/dL (ref 0.61–1.24)
Glucose, Bld: 109 mg/dL — ABNORMAL HIGH (ref 70–99)
HCT: 39 % (ref 39.0–52.0)
Hemoglobin: 13.3 g/dL (ref 13.0–17.0)
Potassium: 4.2 mmol/L (ref 3.5–5.1)
Sodium: 133 mmol/L — ABNORMAL LOW (ref 135–145)
TCO2: 31 mmol/L (ref 22–32)

## 2019-04-25 LAB — BRAIN NATRIURETIC PEPTIDE: B Natriuretic Peptide: 52.7 pg/mL (ref 0.0–100.0)

## 2019-04-25 LAB — TROPONIN I (HIGH SENSITIVITY)
Troponin I (High Sensitivity): 13 ng/L (ref <18)
Troponin I (High Sensitivity): 15 ng/L (ref <18)

## 2019-04-25 MED ORDER — IOHEXOL 350 MG/ML SOLN
100.0000 mL | Freq: Once | INTRAVENOUS | Status: AC | PRN
  Administered 2019-04-25: 100 mL via INTRAVENOUS

## 2019-04-25 MED ORDER — LACTATED RINGERS IV BOLUS
1000.0000 mL | Freq: Once | INTRAVENOUS | Status: AC
  Administered 2019-04-26: 1000 mL via INTRAVENOUS

## 2019-04-25 MED ORDER — SODIUM CHLORIDE 0.9 % IV BOLUS
500.0000 mL | Freq: Once | INTRAVENOUS | Status: AC
  Administered 2019-04-25: 500 mL via INTRAVENOUS

## 2019-04-25 MED ORDER — SODIUM CHLORIDE 0.9% FLUSH
3.0000 mL | Freq: Once | INTRAVENOUS | Status: AC
  Administered 2019-04-26: 3 mL via INTRAVENOUS

## 2019-04-25 NOTE — ED Notes (Signed)
CRITICAL LAB - Lactic 2.6 Irving Burton RN  Mesner MD notified.

## 2019-04-25 NOTE — ED Notes (Signed)
Called about pt's spot in line for CT. Stated pt was going shortly

## 2019-04-25 NOTE — Discharge Instructions (Signed)
It was a pleasure meeting you tonight. Fortunately, your workup did not a show a heart attack or a blood clot in your lung. Most likely, your pain is from irritation of the muscles and cartilage between your ribs. Typically, we would tell people to take NSAIDS for this however, your labs showed that your kidney function was down a little bit and so I would not recommend you take NSAIDS until your kidneys improve. You should get your kidney function checked at that appointment.

## 2019-04-25 NOTE — ED Notes (Addendum)
MD informed of pt's hypotension, 84/57. Awaiting orders

## 2019-04-25 NOTE — ED Triage Notes (Signed)
Pt was discharged from hospital this AM, diagnosed w/ COVID on 04/09/19.  EMS called to home, pt was cool, pale, diaphoretic and lethargic.  89% on RA, 60/40.  Was given 500NS and placed on 3L - increased to 96% and 114/68.  No fever but last tylenol was 3pm  Wife contact info: Vira Agar [phone].

## 2019-04-25 NOTE — ED Provider Notes (Signed)
[facility] EMERGENCY DEPARTMENT Provider Note   CSN: [id number] Arrival date & time: 04/24/19  2333     History No chief complaint on file.   Kenneth Rommel Sr. is a 69 y.o. male with relative past medical history including coronary artery disease status post 4 stent placements, chronic bronchitis, hypertension, hyperlipidemia, and type 2 diabetes.  He was recently hospitalized with COVID-19 infection and has just finished his final prednisone dosing. He notes that since his hospitalization and subsequent discharge 3 days ago, he was feeling significantly better until tonight.  Starting around 2100 tonight he began to experience sharp constant left-sided chest pain that did not radiate.  He endorses associated shortness of breath with this.  He also states that around the same time he began to cough quite a bit.  Since his hospital discharge, he denies fever or chills.  Pain is aggravated with coughing. He denies a history of blood clots.  Denies recent long period of immobilization.     Past Medical History:  Diagnosis Date  . Arthritis    "left thumb" (05/23/2017)  . Barrett's esophagus    "we've been told that it's all gone; still take RX for GERD" (05/23/2017)  . CAD (coronary artery disease), native coronary artery    Coronary angiogram 05/03/2014:  Proximal LAD 3.0x12 mm Promus premier stent.  04/08/2014: Mid Cx 3.5 x 16 mm Promus DES.  03/29/2013: Mid LAD 2.75 x 38 mm Promus Premier drug-eluting stent, balloon angioplasty of D1 and distal LAD and stenting of distal RCA with 2.75 x 24 mm promos Premier drug-eluting stent 12/15/2012 widely patent.  . Cervicalgia   . Chronic bronchitis (HCC)    "q yr" (05/23/2017)  . Complication of anesthesia    " I do not wake up very well "  . COVID-19   . Family history of adverse reaction to anesthesia    "son w/PONV"  . GERD (gastroesophageal reflux disease)   . Heart murmur    "grew out of it"   . Hyperlipidemia   .  Hypoglycemia, unspecified   . IDDM (insulin dependent diabetes mellitus)   . Impotence of organic origin   . Iron deficiency anemia   . Other malaise and fatigue   . PONV (postoperative nausea and vomiting)   . Restless leg   . Tension headache    "sometimes" (05/23/2017)  . Unspecified gastritis and gastroduodenitis with hemorrhage   . Unstable angina pectoris (HCC) 04/08/2014   Coronary angiogram 05/03/2014:  Proximal LAD 3.0x12 mm Promus premier stent.  04/08/2014: Mid Cx 3.5 x 16 mm Promus DES.  03/29/2013: Mid LAD 2.75 x 38 mm Promus Premier drug-eluting stent, balloon angioplasty of D1 and distal LAD and stenting of distal RCA with 2.75 x 24 mm promos Premier drug-eluting stent 12/15/2012 widely patent.    Patient Active Problem List   Diagnosis Date Noted  . Acute respiratory failure with hypoxia (HCC) 04/14/2019  . COVID-19 virus infection 04/14/2019  . Hypokalemia 04/14/2019  . Diabetes mellitus type 2, uncontrolled (HCC) 04/14/2019  . GERD (gastroesophageal reflux disease) 04/14/2019  . Simple chronic bronchitis (HCC) 10/09/2017  . 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 TSH elevation 07/05/2015  . Plantar fasciitis 04/26/2015  . Tremor 02/14/2015  . Voice disorder 10/19/2014  . Unstable angina (HCC) 04/08/2014  . Anemia, iron deficiency 09/22/2013  . Shortness of breath 08/31/2013  . Achilles tendon pain 06/16/2013  . CAD (coronary artery disease), native coronary artery 12/15/2012  . Uncontrolled diabetes mellitus with  circulatory complication, with long-term current use of insulin (HCC) 06/16/2012  . Impotence of organic origin 06/16/2012  . Umbilical hernia without mention of obstruction or gangrene 06/16/2012  . Barrett's esophagus 06/16/2012  . Unspecified gastritis and gastroduodenitis with hemorrhage 06/16/2012  . 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 Hyperlipidemia 06/16/2012  . Cholelithiasis 06/16/2012  . Slowing of urinary  stream 06/16/2012  . Pain in joint, lower leg 06/16/2012  . Essential hypertension 06/16/2012  . Restless legs syndrome (RLS) 06/16/2012  . Tension headache 06/16/2012    Past Surgical History:  Procedure Laterality Date  . CARDIAC CATHETERIZATION N/A 09/25/2015   Procedure: Left Heart Cath and Coronary Angiography;  Surgeon: Yates DecampJay Ganji, MD;  [facility];  Service: Cardiovascular;  Laterality: N/A;  . CARDIAC CATHETERIZATION N/A 09/25/2015   Procedure: Intravascular Pressure Wire/FFR Study;  Surgeon: Yates DecampJay Ganji, MD;  [facility];  Service: Cardiovascular;  Laterality: N/A;  . CORONARY ANGIOPLASTY WITH STENT PLACEMENT  12/15/2012; 04/28/2014; 05/03/2014   "2; 1; 1"   . FRACTIONAL FLOW RESERVE WIRE Right 03/26/2013   Procedure: FRACTIONAL FLOW RESERVE WIRE;  Surgeon: Pamella PertJagadeesh R Ganji, MD;  [facility];  Service: Cardiovascular;  Laterality: Right;  . FRACTIONAL FLOW RESERVE WIRE N/A 05/03/2014   Procedure: FRACTIONAL FLOW RESERVE WIRE;  Surgeon: Pamella PertJagadeesh R Ganji, MD;  [facility];  Service: Cardiovascular;  Laterality: N/A;  . HERNIA REPAIR  2010   "umbilical"   . INCISION AND DRAINAGE ABSCESS Left 05/2007   "groin"   . KNEE SURGERY Right 1992;  2000   "calcium deposits removed" (12/15/2012)  . LEFT HEART CATH AND CORONARY ANGIOGRAPHY N/A 05/23/2017   Procedure: LEFT HEART CATH AND CORONARY ANGIOGRAPHY;  Surgeon: Yates DecampGanji, Jay, MD;  Location: [facility] INVASIVE CV LAB;  Service: Cardiovascular;  Laterality: N/A;  . LEFT HEART CATHETERIZATION WITH CORONARY ANGIOGRAM N/A 12/15/2012   Procedure: LEFT HEART CATHETERIZATION WITH CORONARY ANGIOGRAM;  Surgeon: Pamella PertJagadeesh R Ganji, MD;  [facility];  Service: Cardiovascular;  Laterality: N/A;  . LEFT HEART CATHETERIZATION WITH CORONARY ANGIOGRAM N/A 03/26/2013   Procedure: LEFT HEART CATHETERIZATION WITH CORONARY ANGIOGRAM;  Surgeon: Pamella PertJagadeesh R Ganji, MD;  [facility];  Service: Cardiovascular;   Laterality: N/A;  . LEFT HEART CATHETERIZATION WITH CORONARY ANGIOGRAM N/A 04/08/2014   Procedure: LEFT HEART CATHETERIZATION WITH CORONARY ANGIOGRAM;  Surgeon: Micheline ChapmanMichael D Cooper, MD;  [facility];  Service: Cardiovascular;  Laterality: N/A;  . PERCUTANEOUS CORONARY STENT INTERVENTION (PCI-S) N/A 05/03/2014   Procedure: PERCUTANEOUS CORONARY STENT INTERVENTION (PCI-S);  Surgeon: Pamella PertJagadeesh R Ganji, MD;  [facility];  Service: Cardiovascular;  Laterality: N/A;  . UMBILICAL HERNIA REPAIR         Family History  Adopted: Yes  Family history unknown: Yes    Social History   Tobacco Use  . Smoking status: Never Smoker  . Smokeless tobacco: Never Used  Substance Use Topics  . Alcohol use: No  . Drug use: No    Home Medications Prior to Admission medications   Medication Sig Start Date End Date Taking? Authorizing Provider  acetaminophen (TYLENOL) 325 MG tablet Take 2 tablets (650 mg total) by mouth every 6 (six) hours as needed for mild pain or headache (fever >/= 101). 04/18/19   Elgergawy, Leana Roeawood S, MD  albuterol (PROVENTIL HFA;VENTOLIN HFA) 108 (90 Base) MCG/ACT inhaler Inhale 2 puffs into the lungs every 6 (six) hours as needed for wheezing  or shortness of breath. 06/08/18   Reed, Tiffany L, DO  Alirocumab (PRALUENT) 75 MG/ML SOAJ Inject 75 mg into the skin every 14 (fourteen) days. 03/29/19   Adrian Prows, MD  aspirin 81 MG EC tablet Take 1 tablet (81 mg total) by mouth daily. 12/16/12   Adrian Prows, MD  BD PEN NEEDLE NANO U/F 32G X 4 MM MISC USE TWICE DAILY WITH LANTUS PEN 06/01/18   Reed, Tiffany L, DO  benzonatate (TESSALON) 200 MG capsule Take 200 mg by mouth 3 (three) times daily as needed for cough.    [provider]  carvedilol (COREG) 3.125 MG tablet Take 1 tablet (3.125 mg total) by mouth 2 (two) times daily with a meal. 04/18/19   Elgergawy, Silver Huguenin, MD  [employer] VITAMIN C 500 MG tablet TAKE 1 TABLET BY MOUTH TWICE DAILY 03/10/19   Reed, Tiffany L, DO  D-5000 125  MCG (5000 UT) TABS TAKE 1 TABLET BY MOUTH EVERY DAY 03/10/19   Reed, Tiffany L, DO  dexamethasone (DECADRON) 2 MG tablet Take 5 tablets (10 mg total) by mouth daily. 04/19/19   Elgergawy, Silver Huguenin, MD  dextromethorphan-guaiFENesin (MUCINEX DM) 30-600 MG 12hr tablet Take 1 tablet by mouth 2 (two) times daily as needed for cough.    [provider]  famotidine (PEPCID) 20 MG tablet Take 1 tablet (20 mg total) by mouth 2 (two) times daily. 04/18/19   Elgergawy, Silver Huguenin, MD  fluticasone (FLONASE) 50 MCG/ACT nasal spray Place 1 spray into both nostrils daily as needed for allergies or rhinitis.    [provider]  gabapentin (NEURONTIN) 100 MG capsule Take 200 mg by mouth daily after supper. 05/14/17   [provider]  Insulin Glargine (BASAGLAR KWIKPEN) 100 UNIT/ML SOPN INJECT 40 UNITS SUBCUTANEOUSLY INTO THE SKIN AT BEDTIME 04/30/18   Mariea Clonts, Tiffany L, DO  LIFESCAN FINEPOINT LANCETS MISC Use to test for blood sugar three times daily dx: E11.51 06/01/18   Lauree Chandler, NP  metFORMIN (GLUCOPHAGE) 1000 MG tablet Take 1 tablet (1,000 mg total) by mouth 2 (two) times daily with a meal. 04/22/19   Dewaine Oats, Carlos American, NP  nitroGLYCERIN (NITROSTAT) 0.4 MG SL tablet PLACE 1 TABLET (0.4 MG TOTAL) UNDER THE TONGUE EVERY 5 (FIVE) MINUTES AS NEEDED FOR CHEST PAIN. 06/23/17   Reed, Tiffany L, DO  ondansetron (ZOFRAN) 4 MG tablet Take 1 tablet (4 mg total) by mouth every 8 (eight) hours as needed for nausea or vomiting. 04/12/19   Ngetich, Nelda Bucks, NP  ONETOUCH ULTRA test strip USE TO TEST FOR BLOOD SUGAR THREE TIMES DAILY DX: E11.51 12/04/18   Reed, Tiffany L, DO  pantoprazole (PROTONIX) 40 MG tablet Take 40 mg by mouth daily.    [provider]  pramipexole (MIRAPEX) 1 MG tablet TAKE 2 TABLETS (2 MG TOTAL) BY MOUTH AT BEDTIME. DX G25.81 03/26/19   Reed, Tiffany L, DO  valsartan (DIOVAN) 80 MG tablet Take 1 tablet (80 mg total) by mouth daily. 03/17/19   Adrian Prows, MD    Allergies      Lyrica [pregabalin], Statins, Metformin and related, and Trulicity [dulaglutide]  Review of Systems   Review of Systems  Constitutional: Negative for chills and fever.  HENT: Negative.   Respiratory: Positive for cough and shortness of breath.   Cardiovascular: Positive for chest pain.  Gastrointestinal: Negative for abdominal pain.  Genitourinary: Negative.   Musculoskeletal: Negative.   Skin: Negative.   Neurological: Negative.   Psychiatric/Behavioral: Negative.  Physical Exam Updated Vital Signs BP 97/60   Pulse 64   Temp 98.2 F (36.8 C) (Oral)   Resp 20   Ht 5\' 8"  (1.727 m)   Wt 77.1 kg   SpO2 94%   BMI 25.85 kg/m   Physical Exam Constitutional:      General: He is in acute distress.  HENT:     Nose: Nose normal.     Mouth/Throat:     Mouth: Mucous membranes are moist.  Cardiovascular:     Rate and Rhythm: Normal rate and regular rhythm.  Pulmonary:     Effort: Respiratory distress present.     Breath sounds: Normal breath sounds. No wheezing or rhonchi.  Chest:     Chest wall: No tenderness.  Abdominal:     General: Bowel sounds are normal.  Musculoskeletal:        General: No tenderness.     Cervical back: Neck supple.     Right lower leg: No edema.     Left lower leg: No edema.  Skin:    General: Skin is warm and dry.  Neurological:     General: No focal deficit present.     Mental Status: He is alert.  Psychiatric:        Mood and Affect: Mood normal.     ED Results / Procedures / Treatments   Labs (all labs ordered are listed, but only abnormal results are displayed) Labs Reviewed  CBC - Abnormal; Notable for the following components:      Result Value   WBC 12.8 (*)    Platelets 92 (*)    All other components within normal limits  COMPREHENSIVE METABOLIC PANEL - Abnormal; Notable for the following components:   Sodium 132 (*)    Glucose, Bld 111 (*)    BUN 30 (*)    Creatinine, Ser 1.35 (*)    Calcium 7.9 (*)    Total Protein  5.2 (*)    Albumin 2.6 (*)    GFR calc non Af Amer 53 (*)    All other components within normal limits  I-STAT CHEM 8, ED - Abnormal; Notable for the following components:   Sodium 133 (*)    Chloride 96 (*)    BUN 38 (*)    Glucose, Bld 109 (*)    Calcium, Ion 1.03 (*)    All other components within normal limits  BRAIN NATRIURETIC PEPTIDE  TROPONIN I (HIGH SENSITIVITY)  TROPONIN I (HIGH SENSITIVITY)    EKG EKG Interpretation  Date/Time:  Saturday April 24 2019 23:59:43 EST Ventricular Rate:  64 PR Interval:    QRS Duration: 97 QT Interval:  422 QTC Calculation: 436 R Axis:   -16 Text Interpretation: Sinus rhythm Left ventricular hypertrophy No significant change since last tracing Confirmed by 05[birth year]1978 [phone]) on 04/25/2019 12:06:09 AM   Radiology CT Angio Chest PE W/Cm &/Or Wo Cm  Result Date: 04/25/2019 CLINICAL DATA:  Chest pain and shortness of breath. COVID positive, recent hospital discharge. EXAM: CT ANGIOGRAPHY CHEST WITH CONTRAST TECHNIQUE: Multidetector CT imaging of the chest was performed using the standard protocol during bolus administration of intravenous contrast. Multiplanar CT image reconstructions and MIPs were obtained to evaluate the vascular anatomy. CONTRAST:  55mL OMNIPAQUE IOHEXOL 350 MG/ML SOLN COMPARISON:  Radiograph yesterday. Radiograph 04/14/2019 FINDINGS: Cardiovascular: There are no filling defects within the pulmonary arteries to suggest pulmonary embolus. Evaluation of the subsegmental branches is limited at the lung bases due to motion. Heart is  normal in size. Coronary artery calcifications/stents. The thoracic aorta is normal in caliber. No evidence of aortic dissection. Mediastinum/Nodes: Small mediastinal and bilateral hilar lymph nodes, not enlarged by size criteria. No esophageal wall thickening. No suspicious thyroid nodule. Lungs/Pleura: Multifocal linear, ground-glass, and consolidative opacities throughout both lungs. There is  peripheral predominant distribution. Trace right pleural effusion. No pneumothorax. The trachea and mainstem bronchi are patent. Upper Abdomen: No acute findings. Musculoskeletal: There are no acute or suspicious osseous abnormalities. Review of the MIP images confirms the above findings. IMPRESSION: 1. No pulmonary embolus. 2. Multifocal linear, ground-glass, and consolidative opacities throughout both lungs. Findings are consistent with patient's history of COVID-19 pneumonia. Parenchymal involvement is moderate. Trace right pleural effusion. 3. Coronary artery calcifications/stents. Electronically Signed   By: Narda Rutherford M.D.   On: 04/25/2019 03:21   DG Chest Portable 1 View  Result Date: 04/25/2019 CLINICAL DATA:  Left-sided chest pain over the last hour. Coronavirus infection. EXAM: PORTABLE CHEST 1 VIEW COMPARISON:  04/14/2019 FINDINGS: Heart size is normal. Coronary artery stents are visible. Widespread patchy pulmonary infiltrates again evident affecting both lungs, left worse than right. There is been some clearing at the left base. No worsening or new finding. No effusion. No bone abnormality IMPRESSION: Patchy bilateral pulmonary infiltrates left more than right, but with some clearing at the left base since study of 10 days ago. Electronically Signed   By: Paulina Fusi M.D.   On: 04/25/2019 00:24      Medications Ordered in ED Medications  sodium chloride 0.9 % bolus 500 mL (500 mLs Intravenous New Bag/Given 04/25/19 0050)  iohexol (OMNIPAQUE) 350 MG/ML injection 100 mL (100 mLs Intravenous Contrast Given 04/25/19 7893)    ED Course  I have reviewed the triage vital signs and the nursing notes.  Pertinent labs & imaging results that were available during my care of the patient were reviewed by me and considered in my medical decision making (see chart for details).    MDM Rules/Calculators/A&P                      4:57 AM This is a 69 year old male who was recently discharged  from the hospital 3 days ago for COVID-19 infection.  He was feeling well up until around 2100 this evening at which time he acutely developed left-sided chest pain and shortness of breath. Differential diagnosis should include pulmonary embolism, ACS, musculoskeletal, Given his recent infection and his narrative, PE is certainly higher on my differential.  His hypotension  could also be related to this.  Will obtain a CTA to evaluate for this. He does also have a significant cardiac history with placement of 4 stents.  EKG is normal however will obtain troponin for further evaluation Unlikely that this is musculoskeletal as he does not have any tenderness to palpation over the affected area.  Additionally would not explain the hypotension or the hypoxia.  4:57 AM Blood pressure slightly down since arrival--84/57. bolus ordered.  4:57 AM CTA negative for embolism and showed post covid changes. Tropononis processing. His white count is likely risidual from his recent infection. Suspect his hypotension was related to dehyration as it has improved since IVF.  4:57 AM Labs suggest AKI. Trops 13>>15 so low suspicion for ACS. Discussed results and educated patient on return precautions. He already has an appointment set up with his PCP on 2/23.  Final Clinical Impression(s) / ED Diagnoses Final diagnoses:  AKI (acute kidney injury) (HCC)  Costochondritis    Rx / DC Orders ED Discharge Orders    None       Elige Radon, MD 04/25/19 [id number]    Marily Memos, MD 04/25/19 [phone]

## 2019-04-25 NOTE — ED Notes (Signed)
Please call spouse [name] @ [phone]--for a status update--[name]

## 2019-04-25 NOTE — ED Provider Notes (Signed)
Emergency Department Provider Note   I have reviewed the triage vital signs and the nursing notes.   HISTORY  Chief Complaint Altered Mental Status and Hypotension   HPI Kenneth Buccheri Sr. is a 69 y.o. male who presents the emergency department today for weakness.  Patient was seen by myself last night who is here for what seems like musculoskeletal chest pain.  Had a PET scan which was negative had stable vital signs and was discharged in stable condition.  Apparently the patient went home slept most the day did not eat or drink much but is felt weak throughout the day.  This evening he became more weak than he was and started feeling like he was in a pass out.  EMS was called his blood pressure was 60/40 is oxygen saturation at that time was 89%.  Patient was given some fluids and some oxygen and now feels a little bit better and blood pressure is slightly improved.  No fevers.  No new symptoms since last night.  No cough, nausea but does have decreased appetite.  States his chest actually feels better than last night.   No other associated or modifying symptoms.    Past Medical History:  Diagnosis Date  . Arthritis    "left thumb" (05/23/2017)  . Barrett's esophagus    "we've been told that it's all gone; still take RX for GERD" (05/23/2017)  . CAD (coronary artery disease), native coronary artery    Coronary angiogram 05/03/2014:  Proximal LAD 3.0x12 mm Promus premier stent.  04/08/2014: Mid Cx 3.5 x 16 mm Promus DES.  03/29/2013: Mid LAD 2.75 x 38 mm Promus Premier drug-eluting stent, balloon angioplasty of D1 and distal LAD and stenting of distal RCA with 2.75 x 24 mm promos Premier drug-eluting stent 12/15/2012 widely patent.  . Cervicalgia   . Chronic bronchitis (HCC)    "q yr" (05/23/2017)  . Complication of anesthesia    " I do not wake up very well "  . COVID-19   . Family history of adverse reaction to anesthesia    "son w/PONV"  . GERD (gastroesophageal reflux disease)   .  Heart murmur    "grew out of it"   . Hyperlipidemia   . Hypoglycemia, unspecified   . IDDM (insulin dependent diabetes mellitus)   . Impotence of organic origin   . Iron deficiency anemia   . Other malaise and fatigue   . PONV (postoperative nausea and vomiting)   . Restless leg   . Tension headache    "sometimes" (05/23/2017)  . Unspecified gastritis and gastroduodenitis with hemorrhage   . Unstable angina pectoris (HCC) 04/08/2014   Coronary angiogram 05/03/2014:  Proximal LAD 3.0x12 mm Promus premier stent.  04/08/2014: Mid Cx 3.5 x 16 mm Promus DES.  03/29/2013: Mid LAD 2.75 x 38 mm Promus Premier drug-eluting stent, balloon angioplasty of D1 and distal LAD and stenting of distal RCA with 2.75 x 24 mm promos Premier drug-eluting stent 12/15/2012 widely patent.    Patient Active Problem List   Diagnosis Date Noted  . Sepsis due to pneumonia (HCC) 04/26/2019  . Mild renal insufficiency 04/26/2019  . Hyponatremia 04/26/2019  . Thrombocytopenia (HCC) 04/26/2019  . Acute respiratory failure with hypoxia (HCC) 04/14/2019  . COVID-19 virus infection 04/14/2019  . Hypokalemia 04/14/2019  . Diabetes mellitus type 2, uncontrolled (HCC) 04/14/2019  . GERD (gastroesophageal reflux disease) 04/14/2019  . Simple chronic bronchitis (HCC) 10/09/2017  . 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 TSH  elevation 07/05/2015  . Plantar fasciitis 04/26/2015  . Tremor 02/14/2015  . Voice disorder 10/19/2014  . Unstable angina ([place]) 04/08/2014  . Anemia, iron deficiency 09/22/2013  . Shortness of breath 08/31/2013  . Achilles tendon pain 06/16/2013  . CAD (coronary artery disease), native coronary artery 12/15/2012  . Uncontrolled diabetes mellitus with circulatory complication, with long-term current use of insulin ([place]) 06/16/2012  . Impotence of organic origin 06/16/2012  . Umbilical hernia without mention of obstruction or gangrene 06/16/2012  . Barrett's esophagus 06/16/2012  . Unspecified gastritis and  gastroduodenitis with hemorrhage 06/16/2012  . Controlled type 2 DM with peripheral circulatory disorder ([place]) 06/16/2012  . Hyperlipidemia 06/16/2012  . Cholelithiasis 06/16/2012  . Slowing of urinary stream 06/16/2012  . Pain in joint, lower leg 06/16/2012  . Essential hypertension 06/16/2012  . Restless legs syndrome (RLS) 06/16/2012  . Tension headache 06/16/2012    Past Surgical History:  Procedure Laterality Date  . CARDIAC CATHETERIZATION N/A 09/25/2015   Procedure: Left Heart Cath and Coronary Angiography;  Surgeon: Adrian Prows, MD;  Location: [place] [facility];  Service: Cardiovascular;  Laterality: N/A;  . CARDIAC CATHETERIZATION N/A 09/25/2015   Procedure: Intravascular Pressure Wire/FFR Study;  Surgeon: Adrian Prows, MD;  Location: [place] [facility];  Service: Cardiovascular;  Laterality: N/A;  . CORONARY ANGIOPLASTY WITH STENT PLACEMENT  12/15/2012; 04/28/2014; 05/03/2014   "2; 1; 1"   . FRACTIONAL FLOW RESERVE WIRE Right 03/26/2013   Procedure: FRACTIONAL FLOW RESERVE WIRE;  Surgeon: Laverda Page, MD;  Location: [facility] CATH LAB;  Service: Cardiovascular;  Laterality: Right;  . FRACTIONAL FLOW RESERVE WIRE N/A 05/03/2014   Procedure: FRACTIONAL FLOW RESERVE WIRE;  Surgeon: Laverda Page, MD;  Location: [facility] CATH LAB;  Service: Cardiovascular;  Laterality: N/A;  . HERNIA REPAIR  7616   "umbilical"   . INCISION AND DRAINAGE ABSCESS Left 05/2007   "groin"   . KNEE SURGERY Right 1992;  2000   "calcium deposits removed" (12/15/2012)  . LEFT HEART CATH AND CORONARY ANGIOGRAPHY N/A 05/23/2017   Procedure: LEFT HEART CATH AND CORONARY ANGIOGRAPHY;  Surgeon: Adrian Prows, MD;  Location: [place] [facility];  Service: Cardiovascular;  Laterality: N/A;  . LEFT HEART CATHETERIZATION WITH CORONARY ANGIOGRAM N/A 12/15/2012   Procedure: LEFT HEART CATHETERIZATION WITH CORONARY ANGIOGRAM;  Surgeon: Laverda Page, MD;  Location: [facility] CATH LAB;  Service: Cardiovascular;  Laterality: N/A;  . LEFT  HEART CATHETERIZATION WITH CORONARY ANGIOGRAM N/A 03/26/2013   Procedure: LEFT HEART CATHETERIZATION WITH CORONARY ANGIOGRAM;  Surgeon: Laverda Page, MD;  Location: [facility] CATH LAB;  Service: Cardiovascular;  Laterality: N/A;  . LEFT HEART CATHETERIZATION WITH CORONARY ANGIOGRAM N/A 04/08/2014   Procedure: LEFT HEART CATHETERIZATION WITH CORONARY ANGIOGRAM;  Surgeon: Blane Ohara, MD;  Location: [facility] CATH LAB;  Service: Cardiovascular;  Laterality: N/A;  . PERCUTANEOUS CORONARY STENT INTERVENTION (PCI-S) N/A 05/03/2014   Procedure: PERCUTANEOUS CORONARY STENT INTERVENTION (PCI-S);  Surgeon: Laverda Page, MD;  Location: [facility] CATH LAB;  Service: Cardiovascular;  Laterality: N/A;  . UMBILICAL HERNIA REPAIR        Allergies Lyrica [pregabalin], Statins, Metformin and related, and Trulicity [dulaglutide]  Family History  Adopted: Yes  Family history unknown: Yes    Social History Social History   Tobacco Use  . Smoking status: Never Smoker  . Smokeless tobacco: Never Used  Substance Use Topics  . Alcohol use: No  . Drug use: No    Review of Systems  All other systems negative  except as documented in the HPI. All pertinent positives and negatives as reviewed in the HPI. ____________________________________________   PHYSICAL EXAM:  VITAL SIGNS: ED Triage Vitals  Enc Vitals Group     BP 04/25/19 2245 91/64     Pulse Rate 04/25/19 2245 66     Resp 04/25/19 2245 (!) 21     Temp 04/25/19 2245 98.7 F (37.1 C)     Temp Source 04/25/19 2245 Oral     SpO2 04/25/19 2245 93 %     Weight 04/25/19 2246 173 lb (78.5 kg)     Height 04/25/19 2246 5\' 8"  (1.727 m)    Constitutional: Alert and oriented. Well appearing and in no acute distress. Eyes: Conjunctivae are normal. PERRL. EOMI. Head: Atraumatic. Nose: No congestion/rhinnorhea. Mouth/Throat: Mucous membranes are moist.  Oropharynx non-erythematous. Neck: No stridor.  No meningeal signs.   Cardiovascular: Normal rate,  regular rhythm. Good peripheral circulation. Grossly normal heart sounds.   Respiratory: Normal respiratory effort.  No retractions. Lungs CTAB. Gastrointestinal: Soft and nontender. No distention.  Musculoskeletal: No lower extremity tenderness nor edema. No gross deformities of extremities. Neurologic:  Normal speech and language. No gross focal neurologic deficits are appreciated.  Skin:  Skin is warm, dry and intact. No rash noted.   ____________________________________________   LABS (all labs ordered are listed, but only abnormal results are displayed)  Labs Reviewed  LACTIC ACID, PLASMA - Abnormal; Notable for the following components:      Result Value   Lactic Acid, Venous 2.6 (*)    All other components within normal limits  LACTIC ACID, PLASMA - Abnormal; Notable for the following components:   Lactic Acid, Venous 2.5 (*)    All other components within normal limits  COMPREHENSIVE METABOLIC PANEL - Abnormal; Notable for the following components:   Sodium 132 (*)    Glucose, Bld 158 (*)    BUN 24 (*)    Creatinine, Ser 1.35 (*)    Calcium 7.8 (*)    Total Protein 4.8 (*)    Albumin 2.1 (*)    AST 14 (*)    GFR calc non Af Amer 53 (*)    All other components within normal limits  CBC WITH DIFFERENTIAL/PLATELET - Abnormal; Notable for the following components:   WBC 15.4 (*)    Platelets 71 (*)    Neutro Abs 14.1 (*)    Lymphs Abs 0.5 (*)    Abs Immature Granulocytes 0.16 (*)    All other components within normal limits  URINALYSIS, ROUTINE W REFLEX MICROSCOPIC - Abnormal; Notable for the following components:   Specific Gravity, Urine <1.005 (*)    All other components within normal limits  CULTURE, BLOOD (ROUTINE X 2)  CULTURE, BLOOD (ROUTINE X 2)  URINE CULTURE  EXPECTORATED SPUTUM ASSESSMENT W REFEX TO RESP CULTURE  APTT  PROTIME-INR  PROCALCITONIN  PROCALCITONIN  LEGIONELLA PNEUMOPHILA SEROGP 1 UR AG  STREP PNEUMONIAE URINARY ANTIGEN  CBC WITH  DIFFERENTIAL/PLATELET  COMPREHENSIVE METABOLIC PANEL  LACTIC ACID, PLASMA  MAGNESIUM  PROCALCITONIN  PHOSPHORUS  PROTIME-INR  APTT  SAMPLE TO BLOOD BANK   ____________________________________________  EKG   EKG Interpretation  Date/Time:  Sunday April 25 2019 22:44:06 EST Ventricular Rate:  64 PR Interval:    QRS Duration: 105 QT Interval:  429 QTC Calculation: 443 R Axis:   -13 Text Interpretation: Sinus rhythm Atrial premature complex No significant change since last tracing Confirmed by Marily Memos [id number]) on 04/25/2019 10:54:04 PM  ____________________________________________  RADIOLOGY  DG Chest Portable 1 View  Result Date: 04/26/2019 CLINICAL DATA:  Discharge this morning, previously admitted for COVID-19 04/09/2019 EXAM: PORTABLE CHEST 1 VIEW COMPARISON:  Radiograph 04/24/2019, CT 04/25/2019 FINDINGS: Bilateral mixed reticular and consolidative airspace opacities are again noted, increased from comparison radiograph 04/24/2019 and similar to CT performed earlier the same day. No pneumothorax. No effusion. The cardiomediastinal contours are unremarkable. No acute osseous or soft tissue abnormality. Degenerative changes are present in the imaged spine and shoulders. Telemetry leads overlie the chest. IMPRESSION: Bilateral mixed reticular and consolidative airspace opacities, compatible with pneumonia, increasing from 1 day prior. Electronically Signed   By: Kreg Shropshire M.D.   On: 04/26/2019 00:19    ____________________________________________   PROCEDURES  Procedure(s) performed:   .Critical Care Performed by: Marily Memos, MD Authorized by: Marily Memos, MD   Critical care provider statement:    Critical care time (minutes):  45   Critical care was necessary to treat or prevent imminent or life-threatening deterioration of the following conditions:  Sepsis   Critical care was time spent personally by me on the following activities:  Discussions  with consultants, evaluation of patient's response to treatment, examination of patient, ordering and performing treatments and interventions, ordering and review of laboratory studies, ordering and review of radiographic studies, pulse oximetry, re-evaluation of patient's condition, obtaining history from patient or surrogate and review of old charts  ____________________________________________   INITIAL IMPRESSION / ASSESSMENT AND PLAN / ED COURSE  Suspect hypovolemia from decreased intake and his general illness.  He has had weight loss over the last month since has been sick with Covid.  However he does have increased oxygen requirement again and still mild hypotension here we will continue fluids.  We will recheck labs at patient will likely need to be observed after this event.  Patient is white count higher than yesterday blood pressure still soft but improving with fluids.  X-ray looks like he has worsening opacities from yesterday concern for possible post Covid bacterial pneumonia so started antibiotics, code sepsis called (slightly late as initially I was thinking that the low bp and lactic were related to hypovolemia from decreased intake rather than sepsis until full clinical picture came into view).  Blood cultures drawn prior to antibiotics.  Initial lactic acid 2.6.  Blood pressure improved with fluids we will continue the same but this time patient appears relatively stable for admission and will discuss with hospitalist for same.  Pertinent labs & imaging results that were available during my care of the patient were reviewed by me and considered in my medical decision making (see chart for details). ____________________________________________  FINAL CLINICAL IMPRESSION(S) / ED DIAGNOSES  Final diagnoses:  Dehydration  AKI (acute kidney injury) (HCC)  Sepsis, due to unspecified organism, unspecified whether acute organ dysfunction present (HCC)  Hypoxia     MEDICATIONS  GIVEN DURING THIS VISIT:  Medications  sodium chloride flush (NS) 0.9 % injection 3 mL (has no administration in time range)  sodium chloride flush (NS) 0.9 % injection 3 mL (has no administration in time range)  0.9 %  sodium chloride infusion (has no administration in time range)  acetaminophen (TYLENOL) tablet 650 mg (has no administration in time range)    Or  acetaminophen (TYLENOL) suppository 650 mg (has no administration in time range)  senna-docusate (Senokot-S) tablet 1 tablet (has no administration in time range)  ondansetron (ZOFRAN) tablet 4 mg (has no administration in time range)  Or  ondansetron (ZOFRAN) injection 4 mg (has no administration in time range)  insulin aspart (novoLOG) injection 0-9 Units (has no administration in time range)  insulin aspart (novoLOG) injection 0-5 Units (has no administration in time range)  cefTRIAXone (ROCEPHIN) 2 g in sodium chloride 0.9 % 100 mL IVPB (has no administration in time range)  azithromycin (ZITHROMAX) 500 mg in sodium chloride 0.9 % 250 mL IVPB (has no administration in time range)  sodium chloride flush (NS) 0.9 % injection 3 mL (3 mLs Intravenous Given 04/26/19 0003)  lactated ringers bolus 1,000 mL (0 mLs Intravenous Stopped 04/26/19 0441)  cefTRIAXone (ROCEPHIN) 1 g in sodium chloride 0.9 % 100 mL IVPB (0 g Intravenous Stopped 04/26/19 0208)  azithromycin (ZITHROMAX) 500 mg in sodium chloride 0.9 % 250 mL IVPB (0 mg Intravenous Stopped 04/26/19 0331)  lactated ringers bolus 1,000 mL (0 mLs Intravenous Stopped 04/26/19 0330)     NEW OUTPATIENT MEDICATIONS STARTED DURING THIS VISIT:  Current Discharge Medication List      Note:  This note was prepared with assistance of Dragon voice recognition software. Occasional wrong-word or sound-a-like substitutions may have occurred due to the inherent limitations of voice recognition software.   [place], Barbara Cower, MD 04/26/19 [phone]

## 2019-04-25 NOTE — ED Notes (Signed)
Lab unable to locate labs that were sent down. Labs redrawn and sent down

## 2019-04-26 ENCOUNTER — Emergency Department (HOSPITAL_COMMUNITY): Payer: Medicare Other

## 2019-04-26 ENCOUNTER — Encounter (HOSPITAL_COMMUNITY): Payer: Self-pay | Admitting: Family Medicine

## 2019-04-26 DIAGNOSIS — J189 Pneumonia, unspecified organism: Secondary | ICD-10-CM

## 2019-04-26 DIAGNOSIS — K2971 Gastritis, unspecified, with bleeding: Secondary | ICD-10-CM | POA: Diagnosis not present

## 2019-04-26 DIAGNOSIS — N179 Acute kidney failure, unspecified: Secondary | ICD-10-CM | POA: Diagnosis present

## 2019-04-26 DIAGNOSIS — I25110 Atherosclerotic heart disease of native coronary artery with unstable angina pectoris: Secondary | ICD-10-CM | POA: Diagnosis present

## 2019-04-26 DIAGNOSIS — E1165 Type 2 diabetes mellitus with hyperglycemia: Secondary | ICD-10-CM

## 2019-04-26 DIAGNOSIS — J159 Unspecified bacterial pneumonia: Secondary | ICD-10-CM | POA: Diagnosis present

## 2019-04-26 DIAGNOSIS — M2550 Pain in unspecified joint: Secondary | ICD-10-CM | POA: Diagnosis not present

## 2019-04-26 DIAGNOSIS — A419 Sepsis, unspecified organism: Principal | ICD-10-CM | POA: Diagnosis present

## 2019-04-26 DIAGNOSIS — E118 Type 2 diabetes mellitus with unspecified complications: Secondary | ICD-10-CM | POA: Diagnosis not present

## 2019-04-26 DIAGNOSIS — N289 Disorder of kidney and ureter, unspecified: Secondary | ICD-10-CM

## 2019-04-26 DIAGNOSIS — E871 Hypo-osmolality and hyponatremia: Secondary | ICD-10-CM | POA: Diagnosis present

## 2019-04-26 DIAGNOSIS — J1282 Pneumonia due to coronavirus disease 2019: Secondary | ICD-10-CM | POA: Diagnosis present

## 2019-04-26 DIAGNOSIS — U071 COVID-19: Secondary | ICD-10-CM | POA: Diagnosis present

## 2019-04-26 DIAGNOSIS — Z794 Long term (current) use of insulin: Secondary | ICD-10-CM | POA: Diagnosis not present

## 2019-04-26 DIAGNOSIS — K2270 Barrett's esophagus without dysplasia: Secondary | ICD-10-CM | POA: Diagnosis present

## 2019-04-26 DIAGNOSIS — Z955 Presence of coronary angioplasty implant and graft: Secondary | ICD-10-CM | POA: Diagnosis not present

## 2019-04-26 DIAGNOSIS — R6521 Severe sepsis with septic shock: Secondary | ICD-10-CM | POA: Diagnosis present

## 2019-04-26 DIAGNOSIS — Z79899 Other long term (current) drug therapy: Secondary | ICD-10-CM | POA: Diagnosis not present

## 2019-04-26 DIAGNOSIS — D696 Thrombocytopenia, unspecified: Secondary | ICD-10-CM | POA: Diagnosis present

## 2019-04-26 DIAGNOSIS — N390 Urinary tract infection, site not specified: Secondary | ICD-10-CM | POA: Diagnosis present

## 2019-04-26 DIAGNOSIS — E860 Dehydration: Secondary | ICD-10-CM | POA: Diagnosis present

## 2019-04-26 DIAGNOSIS — Z888 Allergy status to other drugs, medicaments and biological substances status: Secondary | ICD-10-CM | POA: Diagnosis not present

## 2019-04-26 DIAGNOSIS — K2901 Acute gastritis with bleeding: Secondary | ICD-10-CM | POA: Diagnosis not present

## 2019-04-26 DIAGNOSIS — R918 Other nonspecific abnormal finding of lung field: Secondary | ICD-10-CM | POA: Diagnosis not present

## 2019-04-26 DIAGNOSIS — D509 Iron deficiency anemia, unspecified: Secondary | ICD-10-CM | POA: Diagnosis present

## 2019-04-26 DIAGNOSIS — E785 Hyperlipidemia, unspecified: Secondary | ICD-10-CM | POA: Diagnosis present

## 2019-04-26 DIAGNOSIS — Y95 Nosocomial condition: Secondary | ICD-10-CM | POA: Diagnosis present

## 2019-04-26 DIAGNOSIS — G2581 Restless legs syndrome: Secondary | ICD-10-CM | POA: Diagnosis present

## 2019-04-26 DIAGNOSIS — I2510 Atherosclerotic heart disease of native coronary artery without angina pectoris: Secondary | ICD-10-CM | POA: Diagnosis present

## 2019-04-26 DIAGNOSIS — I10 Essential (primary) hypertension: Secondary | ICD-10-CM | POA: Diagnosis present

## 2019-04-26 DIAGNOSIS — R5381 Other malaise: Secondary | ICD-10-CM | POA: Diagnosis not present

## 2019-04-26 DIAGNOSIS — I25118 Atherosclerotic heart disease of native coronary artery with other forms of angina pectoris: Secondary | ICD-10-CM | POA: Diagnosis not present

## 2019-04-26 DIAGNOSIS — J9601 Acute respiratory failure with hypoxia: Secondary | ICD-10-CM | POA: Diagnosis present

## 2019-04-26 DIAGNOSIS — E7800 Pure hypercholesterolemia, unspecified: Secondary | ICD-10-CM | POA: Diagnosis not present

## 2019-04-26 DIAGNOSIS — Z7401 Bed confinement status: Secondary | ICD-10-CM | POA: Diagnosis not present

## 2019-04-26 LAB — PROTIME-INR
INR: 1.2 (ref 0.8–1.2)
Prothrombin Time: 15.3 seconds — ABNORMAL HIGH (ref 11.4–15.2)

## 2019-04-26 LAB — COMPREHENSIVE METABOLIC PANEL
ALT: 19 U/L (ref 0–44)
AST: 13 U/L — ABNORMAL LOW (ref 15–41)
Albumin: 2.5 g/dL — ABNORMAL LOW (ref 3.5–5.0)
Alkaline Phosphatase: 48 U/L (ref 38–126)
Anion gap: 7 (ref 5–15)
BUN: 21 mg/dL (ref 8–23)
CO2: 28 mmol/L (ref 22–32)
Calcium: 7.9 mg/dL — ABNORMAL LOW (ref 8.9–10.3)
Chloride: 100 mmol/L (ref 98–111)
Creatinine, Ser: 1.28 mg/dL — ABNORMAL HIGH (ref 0.61–1.24)
GFR calc Af Amer: >60 mL/min (ref >60)
GFR calc non Af Amer: 57 mL/min — ABNORMAL LOW (ref >60)
Glucose, Bld: 84 mg/dL (ref 70–99)
Potassium: 4 mmol/L (ref 3.5–5.1)
Sodium: 135 mmol/L (ref 135–145)
Total Bilirubin: 0.9 mg/dL (ref 0.3–1.2)
Total Protein: 5.3 g/dL — ABNORMAL LOW (ref 6.5–8.1)

## 2019-04-26 LAB — CBC WITH DIFFERENTIAL/PLATELET
Abs Immature Granulocytes: 0.13 10*3/uL — ABNORMAL HIGH (ref 0.00–0.07)
Basophils Absolute: 0 10*3/uL (ref 0.0–0.1)
Basophils Relative: 0 %
Eosinophils Absolute: 0 10*3/uL (ref 0.0–0.5)
Eosinophils Relative: 0 %
HCT: 41.3 % (ref 39.0–52.0)
Hemoglobin: 13.8 g/dL (ref 13.0–17.0)
Immature Granulocytes: 1 %
Lymphocytes Relative: 3 %
Lymphs Abs: 0.4 10*3/uL — ABNORMAL LOW (ref 0.7–4.0)
MCH: 30.3 pg (ref 26.0–34.0)
MCHC: 33.4 g/dL (ref 30.0–36.0)
MCV: 90.6 fL (ref 80.0–100.0)
Monocytes Absolute: 0.5 10*3/uL (ref 0.1–1.0)
Monocytes Relative: 4 %
Neutro Abs: 11.9 10*3/uL — ABNORMAL HIGH (ref 1.7–7.7)
Neutrophils Relative %: 92 %
Platelets: 62 10*3/uL — ABNORMAL LOW (ref 150–400)
RBC: 4.56 MIL/uL (ref 4.22–5.81)
RDW: 13.3 % (ref 11.5–15.5)
WBC: 13 10*3/uL — ABNORMAL HIGH (ref 4.0–10.5)
nRBC: 0 % (ref 0.0–0.2)

## 2019-04-26 LAB — URINALYSIS, ROUTINE W REFLEX MICROSCOPIC
Bilirubin Urine: NEGATIVE
Glucose, UA: NEGATIVE mg/dL
Hgb urine dipstick: NEGATIVE
Ketones, ur: NEGATIVE mg/dL
Leukocytes,Ua: NEGATIVE
Nitrite: NEGATIVE
Protein, ur: NEGATIVE mg/dL
Specific Gravity, Urine: <1.005 — ABNORMAL LOW (ref 1.005–1.030)
pH: 6 (ref 5.0–8.0)

## 2019-04-26 LAB — MAGNESIUM: Magnesium: 1.9 mg/dL (ref 1.7–2.4)

## 2019-04-26 LAB — LACTIC ACID, PLASMA
Lactic Acid, Venous: 2.5 mmol/L (ref 0.5–1.9)
Lactic Acid, Venous: 1.8 mmol/L (ref 0.5–1.9)

## 2019-04-26 LAB — SAMPLE TO BLOOD BANK

## 2019-04-26 LAB — PHOSPHORUS: Phosphorus: 3.2 mg/dL (ref 2.5–4.6)

## 2019-04-26 LAB — APTT: aPTT: 33 seconds (ref 24–36)

## 2019-04-26 LAB — GLUCOSE, CAPILLARY
Glucose-Capillary: 76 mg/dL (ref 70–99)
Glucose-Capillary: 169 mg/dL — ABNORMAL HIGH (ref 70–99)
Glucose-Capillary: 209 mg/dL — ABNORMAL HIGH (ref 70–99)
Glucose-Capillary: 172 mg/dL — ABNORMAL HIGH (ref 70–99)

## 2019-04-26 LAB — PROCALCITONIN: Procalcitonin: 0.23 ng/mL

## 2019-04-26 LAB — MRSA PCR SCREENING: MRSA by PCR: NEGATIVE

## 2019-04-26 MED ORDER — ASPIRIN 81 MG PO TBEC
81.0000 mg | DELAYED_RELEASE_TABLET | Freq: Every day | ORAL | Status: DC
  Administered 2019-04-26 – 2019-05-01 (×6): 81 mg via ORAL
  Filled 2019-04-26 (×10): qty 1

## 2019-04-26 MED ORDER — PIPERACILLIN-TAZOBACTAM 3.375 G IVPB
3.3750 g | Freq: Three times a day (TID) | INTRAVENOUS | Status: AC
  Administered 2019-04-26 – 2019-04-30 (×13): 3.375 g via INTRAVENOUS
  Filled 2019-04-26 (×16): qty 50

## 2019-04-26 MED ORDER — PANTOPRAZOLE SODIUM 40 MG PO TBEC
40.0000 mg | DELAYED_RELEASE_TABLET | Freq: Every day | ORAL | Status: DC
  Administered 2019-04-26 – 2019-05-01 (×6): 40 mg via ORAL
  Filled 2019-04-26 (×6): qty 1

## 2019-04-26 MED ORDER — INSULIN ASPART 100 UNIT/ML ~~LOC~~ SOLN: 0.0000 [IU] | Freq: Three times a day (TID) | SUBCUTANEOUS | Status: DC

## 2019-04-26 MED ORDER — INSULIN ASPART 100 UNIT/ML ~~LOC~~ SOLN
2.0000 [IU] | Freq: Three times a day (TID) | SUBCUTANEOUS | Status: DC
  Administered 2019-04-26 – 2019-05-01 (×16): 2 [IU] via SUBCUTANEOUS

## 2019-04-26 MED ORDER — VITAMIN D 25 MCG (1000 UNIT) PO TABS
1000.0000 [IU] | ORAL_TABLET | Freq: Every day | ORAL | Status: DC
  Administered 2019-04-27 – 2019-05-01 (×5): 1000 [IU] via ORAL
  Filled 2019-04-26 (×5): qty 1

## 2019-04-26 MED ORDER — VITAMIN D 25 MCG (1000 UNIT) PO TABS
5000.0000 [IU] | ORAL_TABLET | Freq: Every day | ORAL | Status: DC
  Administered 2019-04-26: 10:00:00 5000 [IU] via ORAL
  Filled 2019-04-26: qty 5

## 2019-04-26 MED ORDER — SENNOSIDES-DOCUSATE SODIUM 8.6-50 MG PO TABS: 1.0000 | ORAL_TABLET | Freq: Every evening | ORAL | Status: DC | PRN

## 2019-04-26 MED ORDER — ACETAMINOPHEN 325 MG PO TABS
650.0000 mg | ORAL_TABLET | Freq: Four times a day (QID) | ORAL | Status: DC | PRN
  Administered 2019-04-26 – 2019-04-27 (×3): 650 mg via ORAL
  Filled 2019-04-26 (×4): qty 2

## 2019-04-26 MED ORDER — PRAMIPEXOLE DIHYDROCHLORIDE 1 MG PO TABS
1.0000 mg | ORAL_TABLET | Freq: Every evening | ORAL | Status: DC
  Administered 2019-04-26 – 2019-04-30 (×5): 1 mg via ORAL
  Filled 2019-04-26 (×6): qty 1

## 2019-04-26 MED ORDER — BENZONATATE 100 MG PO CAPS
200.0000 mg | ORAL_CAPSULE | Freq: Three times a day (TID) | ORAL | Status: DC | PRN
  Administered 2019-04-27 – 2019-04-30 (×6): 200 mg via ORAL
  Filled 2019-04-26 (×9): qty 2

## 2019-04-26 MED ORDER — VANCOMYCIN HCL 1250 MG/250ML IV SOLN
1250.0000 mg | INTRAVENOUS | Status: DC
  Administered 2019-04-27: 1250 mg via INTRAVENOUS
  Filled 2019-04-26: qty 250

## 2019-04-26 MED ORDER — ACETAMINOPHEN 650 MG RE SUPP: 650.0000 mg | Freq: Four times a day (QID) | RECTAL | Status: DC | PRN

## 2019-04-26 MED ORDER — FAMOTIDINE 20 MG PO TABS
20.0000 mg | ORAL_TABLET | Freq: Two times a day (BID) | ORAL | Status: DC
  Administered 2019-04-26 – 2019-05-01 (×11): 20 mg via ORAL
  Filled 2019-04-26 (×12): qty 1

## 2019-04-26 MED ORDER — INSULIN ASPART 100 UNIT/ML ~~LOC~~ SOLN
0.0000 [IU] | Freq: Three times a day (TID) | SUBCUTANEOUS | Status: DC
  Administered 2019-04-26: 17:00:00 3 [IU] via SUBCUTANEOUS
  Administered 2019-04-26: 12:00:00 2 [IU] via SUBCUTANEOUS
  Administered 2019-04-27: 13:00:00 3 [IU] via SUBCUTANEOUS
  Administered 2019-04-27: 09:00:00 1 [IU] via SUBCUTANEOUS
  Administered 2019-04-27: 18:00:00 3 [IU] via SUBCUTANEOUS
  Administered 2019-04-28: 13:00:00 7 [IU] via SUBCUTANEOUS
  Administered 2019-04-28: 08:00:00 2 [IU] via SUBCUTANEOUS
  Administered 2019-04-28 – 2019-04-29 (×2): 5 [IU] via SUBCUTANEOUS

## 2019-04-26 MED ORDER — SODIUM CHLORIDE 0.9% FLUSH: 3.0000 mL | INTRAVENOUS | Status: DC | PRN

## 2019-04-26 MED ORDER — VANCOMYCIN HCL 1750 MG/350ML IV SOLN
1750.0000 mg | Freq: Once | INTRAVENOUS | Status: AC
  Administered 2019-04-26: 14:00:00 1750 mg via INTRAVENOUS
  Filled 2019-04-26: qty 350

## 2019-04-26 MED ORDER — INSULIN ASPART 100 UNIT/ML ~~LOC~~ SOLN
0.0000 [IU] | Freq: Every day | SUBCUTANEOUS | Status: DC
  Administered 2019-04-28: 20:00:00 5 [IU] via SUBCUTANEOUS
  Administered 2019-04-30: 21:00:00 3 [IU] via SUBCUTANEOUS

## 2019-04-26 MED ORDER — CHOLECALCIFEROL 125 MCG (5000 UT) PO TABS: 1.0000 | ORAL_TABLET | Freq: Every day | ORAL | Status: DC

## 2019-04-26 MED ORDER — DM-GUAIFENESIN ER 30-600 MG PO TB12
1.0000 | ORAL_TABLET | Freq: Two times a day (BID) | ORAL | Status: DC | PRN
  Administered 2019-04-27 – 2019-04-28 (×4): 1 via ORAL
  Filled 2019-04-26 (×4): qty 1

## 2019-04-26 MED ORDER — ONDANSETRON HCL 4 MG/2ML IJ SOLN: 4.0000 mg | Freq: Four times a day (QID) | INTRAMUSCULAR | Status: DC | PRN

## 2019-04-26 MED ORDER — ONDANSETRON HCL 4 MG PO TABS: 4.0000 mg | ORAL_TABLET | Freq: Four times a day (QID) | ORAL | Status: DC | PRN

## 2019-04-26 MED ORDER — SODIUM CHLORIDE 0.9 % IV SOLN: 2.0000 g | INTRAVENOUS | Status: DC

## 2019-04-26 MED ORDER — ALBUTEROL SULFATE HFA 108 (90 BASE) MCG/ACT IN AERS
2.0000 | INHALATION_SPRAY | Freq: Four times a day (QID) | RESPIRATORY_TRACT | Status: DC | PRN
  Administered 2019-04-28: 20:00:00 2 via RESPIRATORY_TRACT
  Filled 2019-04-26: qty 6.7

## 2019-04-26 MED ORDER — LACTATED RINGERS IV BOLUS
1000.0000 mL | Freq: Once | INTRAVENOUS | Status: AC
  Administered 2019-04-26: 03:00:00 1000 mL via INTRAVENOUS

## 2019-04-26 MED ORDER — IPRATROPIUM-ALBUTEROL 20-100 MCG/ACT IN AERS
1.0000 | INHALATION_SPRAY | Freq: Four times a day (QID) | RESPIRATORY_TRACT | Status: DC
  Administered 2019-04-26 – 2019-05-01 (×20): 1 via RESPIRATORY_TRACT
  Filled 2019-04-26: qty 4

## 2019-04-26 MED ORDER — SODIUM CHLORIDE 0.9 % IV SOLN
500.0000 mg | Freq: Once | INTRAVENOUS | Status: AC
  Administered 2019-04-26: 02:00:00 500 mg via INTRAVENOUS
  Filled 2019-04-26: qty 500

## 2019-04-26 MED ORDER — ASCORBIC ACID 500 MG PO TABS
500.0000 mg | ORAL_TABLET | Freq: Two times a day (BID) | ORAL | Status: DC
  Administered 2019-04-26 – 2019-05-01 (×11): 500 mg via ORAL
  Filled 2019-04-26 (×11): qty 1

## 2019-04-26 MED ORDER — SODIUM CHLORIDE 0.9 % IV SOLN
500.0000 mg | INTRAVENOUS | Status: DC
  Administered 2019-04-27: 01:00:00 500 mg via INTRAVENOUS
  Filled 2019-04-26: qty 500

## 2019-04-26 MED ORDER — SODIUM CHLORIDE 0.9 % IV SOLN
1.0000 g | Freq: Once | INTRAVENOUS | Status: AC
  Administered 2019-04-26: 02:00:00 1 g via INTRAVENOUS
  Filled 2019-04-26: qty 10

## 2019-04-26 MED ORDER — INSULIN ASPART 100 UNIT/ML ~~LOC~~ SOLN: 0.0000 [IU] | Freq: Every day | SUBCUTANEOUS | Status: DC

## 2019-04-26 MED ORDER — SODIUM CHLORIDE 0.9 % IV SOLN: 250.0000 mL | INTRAVENOUS | Status: DC | PRN

## 2019-04-26 MED ORDER — FLUTICASONE PROPIONATE 50 MCG/ACT NA SUSP
1.0000 | Freq: Every day | NASAL | Status: DC | PRN
  Filled 2019-04-26: qty 16

## 2019-04-26 MED ORDER — SODIUM CHLORIDE 0.9% FLUSH
3.0000 mL | Freq: Two times a day (BID) | INTRAVENOUS | Status: DC
  Administered 2019-04-26 – 2019-05-01 (×11): 3 mL via INTRAVENOUS

## 2019-04-26 MED ORDER — PRAMIPEXOLE DIHYDROCHLORIDE 1 MG PO TABS
2.0000 mg | ORAL_TABLET | Freq: Every day | ORAL | Status: DC
  Administered 2019-04-26 – 2019-04-30 (×5): 2 mg via ORAL
  Filled 2019-04-26 (×7): qty 2

## 2019-04-26 MED ORDER — GABAPENTIN 100 MG PO CAPS
200.0000 mg | ORAL_CAPSULE | Freq: Every day | ORAL | Status: DC
  Administered 2019-04-26 – 2019-04-30 (×5): 200 mg via ORAL
  Filled 2019-04-26 (×6): qty 2

## 2019-04-26 NOTE — Progress Notes (Signed)
   04/26/19 1012  Family/Significant Other Communication  Family/Significant Other Update Updated;Called (spouse Pam)  left message for spouse Pam [phone]) to call for update.

## 2019-04-26 NOTE — ED Notes (Signed)
[facility] arrived for transport to [facility], nurse Tresa Endo to ride over to facility with [facility]

## 2019-04-26 NOTE — Progress Notes (Signed)
Patient A/ox4, SpO2 95% on 4L [place], stable vitals no complaints of pain. Patient is resting in bed, will get into chair for breakfast. Independent with standby assist, does not use a device to ambulate but has restless leg syndrome.   Plan of care of antibiotics, IS, flutter valve, and up to chair discussed with patient. Patient has no questions. Call light in reach.   1159: Patient complaints of headache. Tylenol given. Patient in chair with no other needs at this time. Lunch tray setup. Patient eating lunch.  1300: Patient states "pain has resolved 0/10". Use of incentive spirometer/flutter valve.

## 2019-04-26 NOTE — ED Notes (Signed)
[facility] called for transport.  

## 2019-04-26 NOTE — Progress Notes (Signed)
PROGRESS NOTE    Kenneth Andrus Sr.  YYT:[id number] DOB: Jul 12, 1950 DOA: 04/25/2019 PCP: Kermit Balo, DO   Brief Narrative:  69 year old with history of DM2-insulin-dependent, CAD, HTN, recent COVID-19 pneumonia diagnosed on 04/09/2019 presents to the ER with generalized weakness, fatigue and near syncope.  Originally received outpatient antibiotics and was admitted to the hospital for progressive dyspnea on 04/14/2019 when he was treated with remdesivir and Decadron.  Discharged about a week ago on room air.  Still progressively worsened at home.  CTA chest done 1 day prior to this admission which was negative for PE.  This time called EMS and was found to be hypotensive and hypoxic.  Assessment & Plan:   Principal Problem:   Sepsis due to pneumonia [facility]) Active Problems:   Essential hypertension   CAD (coronary artery disease), native coronary artery   Diabetes mellitus type 2, uncontrolled (HCC)   Mild renal insufficiency   Hyponatremia   Thrombocytopenia (HCC)  Acute hypoxic respiratory distress secondary to COVID-19 pneumonia Septic shock secondary to superimposed bacterial pneumonia-healthcare acquired, present on admission -Shock physiology improved with fluid resuscitation.  Previously already treated with antibiotics, remdesivir and Decadron. -Maintain supplemental oxygen -CTA chest-negative for PE, multifocal infiltrates -Cultures performed.  Follow-up strep pneumo/Legionella antigen -Procalcitonin-pending.  BNP-normal -Check MRSA swab, discontinue Rocephin.  Add vancomycin and Zosyn -Continue azithromycin for additional atypical coverage  Mild renal insufficiency -Baseline creatinine 1.0, admission creatinine 1.35.  Slowly improving with fluids.  Thrombocytopenia, previously 242.  This admission 71. -Suspect from acute illness.  No obvious evidence of bleeding.  We will continue to monitor platelet levels.  History of coronary artery disease status post PCI -Currently  chest pain-free.  Continue aspirin and statin.  Hold antihypertensives  Essential hypertension -Holding antihypertensives.  Diabetes mellitus type 2, insulin-dependent -A1c 9.2.  Holding home Lantus for now -Insulin sliding scale and Accu-Chek  GERD with Barrett's esophagus -PPI.  Restless leg syndrome -On pramipexole at home  DVT prophylaxis: SCDs due to thrombocytopenia Code Status: Full Family Communication:  Left Pam a voicemail. Disposition Plan:   Patient From= home  Patient Anticipated D/C place= to be determined  Barriers= maintain hospital stay due to significant hypoxia, respiratory distress and dizziness.  Unsafe to be discharged, has failed outpatient treatment.   Antimicrobials:   Azithromycin day 2  Vancomycin day 1  Zosyn day 1   Subjective: Feels little congestion and mild yellowish cough. Denies much shortness of breath at rest.   Review of Systems Otherwise negative except as per HPI, including: General: Denies fever, chills, night sweats or unintended weight loss. Resp: Denies Wheezing.  Cardiac: Denies chest pain, palpitations, orthopnea, paroxysmal nocturnal dyspnea. GI: Denies abdominal pain, nausea, vomiting, diarrhea or constipation GU: Denies dysuria, frequency, hesitancy or incontinence MS: Denies muscle aches, joint pain or swelling Neuro: Denies headache, neurologic deficits (focal weakness, numbness, tingling), abnormal gait Psych: Denies anxiety, depression, SI/HI/AVH Skin: Denies new rashes or lesions ID: Denies sick contacts, exotic exposures, travel  Examination:  General exam: Appears calm and comfortable; 2L [place] Respiratory system: bibasilar rhonchi.  Cardiovascular system: S1 & S2 heard, RRR. No JVD, murmurs, rubs, gallops or clicks. No pedal edema. Gastrointestinal system: Abdomen is nondistended, soft and nontender. No organomegaly or masses felt. Normal bowel sounds heard. Central nervous system: Alert and oriented. No focal  neurological deficits. Extremities: Symmetric 5 x 5 power. Skin: No rashes, lesions or ulcers Psychiatry: Judgement and insight appear normal. Mood & affect appropriate.     Objective: Vitals:  04/26/19 0330 04/26/19 0345 04/26/19 0400 04/26/19 0726  BP: 126/71 (!) 144/79 112/74 115/69  Pulse: 69 73 70 67  Resp: (!) 23 (!) 23 (!) 24 18  Temp:    98.7 F (37.1 C)  TempSrc:    Oral  SpO2: 96% 96% 98% 95%  Weight:      Height:        Intake/Output Summary (Last 24 hours) at 04/26/2019 0728 Last data filed at 04/26/2019 0500 Gross per 24 hour  Intake 580 ml  Output 300 ml  Net 280 ml   Filed Weights   04/25/19 2246 04/26/19 0114  Weight: 78.5 kg 78.5 kg     Data Reviewed:   CBC: Recent Labs  Lab 04/25/19 0233 04/25/19 0258 04/25/19 2249 04/26/19 0530  WBC 12.8*  --  15.4* 13.0*  NEUTROABS  --   --  14.1* 11.9*  HGB 13.7 13.3 13.4 13.8  HCT 40.6 39.0 39.6 41.3  MCV 90.4  --  89.4 90.6  PLT 92*  --  71* 62*   Basic Metabolic Panel: Recent Labs  Lab 04/25/19 0233 04/25/19 0258 04/25/19 2249 04/26/19 0530  NA 132* 133* 132* 135  K 4.2 4.2 3.8 4.0  CL 99 96* 99 100  CO2 25  --  22 28  GLUCOSE 111* 109* 158* 84  BUN 30* 38* 24* 21  CREATININE 1.35* 1.20 1.35* 1.28*  CALCIUM 7.9*  --  7.8* 7.9*  MG  --   --   --  1.9  PHOS  --   --   --  3.2   GFR: Estimated Creatinine Clearance: 52.7 mL/min (A) (by C-G formula based on SCr of 1.28 mg/dL (H)). Liver Function Tests: Recent Labs  Lab 04/25/19 0233 04/25/19 2249 04/26/19 0530  AST 16 14* 13*  ALT 23 19 19   ALKPHOS 42 42 48  BILITOT 1.1 1.1 0.9  PROT 5.2* 4.8* 5.3*  ALBUMIN 2.6* 2.1* 2.5*   No results for input(s): LIPASE, AMYLASE in the last 168 hours. No results for input(s): AMMONIA in the last 168 hours. Coagulation Profile: Recent Labs  Lab 04/26/19 0530  INR 1.2   Cardiac Enzymes: No results for input(s): CKTOTAL, CKMB, CKMBINDEX, TROPONINI in the last 168 hours. BNP (last 3  results) No results for input(s): PROBNP in the last 8760 hours. HbA1C: No results for input(s): HGBA1C in the last 72 hours. CBG: No results for input(s): GLUCAP in the last 168 hours. Lipid Profile: No results for input(s): CHOL, HDL, LDLCALC, TRIG, CHOLHDL, LDLDIRECT in the last 72 hours. Thyroid Function Tests: No results for input(s): TSH, T4TOTAL, FREET4, T3FREE, THYROIDAB in the last 72 hours. Anemia Panel: No results for input(s): VITAMINB12, FOLATE, FERRITIN, TIBC, IRON, RETICCTPCT in the last 72 hours. Sepsis Labs: Recent Labs  Lab 04/25/19 2249 04/26/19 0416 04/26/19 0530  LATICACIDVEN 2.6* 2.5* 1.8    No results found for this or any previous visit (from the past 240 hour(s)).       Radiology Studies: CT Angio Chest PE W/Cm &/Or Wo Cm  Result Date: 04/25/2019 CLINICAL DATA:  Chest pain and shortness of breath. COVID positive, recent hospital discharge. EXAM: CT ANGIOGRAPHY CHEST WITH CONTRAST TECHNIQUE: Multidetector CT imaging of the chest was performed using the standard protocol during bolus administration of intravenous contrast. Multiplanar CT image reconstructions and MIPs were obtained to evaluate the vascular anatomy. CONTRAST:  34mL OMNIPAQUE IOHEXOL 350 MG/ML SOLN COMPARISON:  Radiograph yesterday. Radiograph 04/14/2019 FINDINGS: Cardiovascular: There are no filling defects  within the pulmonary arteries to suggest pulmonary embolus. Evaluation of the subsegmental branches is limited at the lung bases due to motion. Heart is normal in size. Coronary artery calcifications/stents. The thoracic aorta is normal in caliber. No evidence of aortic dissection. Mediastinum/Nodes: Small mediastinal and bilateral hilar lymph nodes, not enlarged by size criteria. No esophageal wall thickening. No suspicious thyroid nodule. Lungs/Pleura: Multifocal linear, ground-glass, and consolidative opacities throughout both lungs. There is peripheral predominant distribution. Trace right  pleural effusion. No pneumothorax. The trachea and mainstem bronchi are patent. Upper Abdomen: No acute findings. Musculoskeletal: There are no acute or suspicious osseous abnormalities. Review of the MIP images confirms the above findings. IMPRESSION: 1. No pulmonary embolus. 2. Multifocal linear, ground-glass, and consolidative opacities throughout both lungs. Findings are consistent with patient's history of COVID-19 pneumonia. Parenchymal involvement is moderate. Trace right pleural effusion. 3. Coronary artery calcifications/stents. Electronically Signed   By: Keith Rake M.D.   On: 04/25/2019 03:21   DG Chest Portable 1 View  Result Date: 04/26/2019 CLINICAL DATA:  Discharge this morning, previously admitted for COVID-19 04/09/2019 EXAM: PORTABLE CHEST 1 VIEW COMPARISON:  Radiograph 04/24/2019, CT 04/25/2019 FINDINGS: Bilateral mixed reticular and consolidative airspace opacities are again noted, increased from comparison radiograph 04/24/2019 and similar to CT performed earlier the same day. No pneumothorax. No effusion. The cardiomediastinal contours are unremarkable. No acute osseous or soft tissue abnormality. Degenerative changes are present in the imaged spine and shoulders. Telemetry leads overlie the chest. IMPRESSION: Bilateral mixed reticular and consolidative airspace opacities, compatible with pneumonia, increasing from 1 day prior. Electronically Signed   By: Lovena Le M.D.   On: 04/26/2019 00:19   DG Chest Portable 1 View  Result Date: 04/25/2019 CLINICAL DATA:  Left-sided chest pain over the last hour. Coronavirus infection. EXAM: PORTABLE CHEST 1 VIEW COMPARISON:  04/14/2019 FINDINGS: Heart size is normal. Coronary artery stents are visible. Widespread patchy pulmonary infiltrates again evident affecting both lungs, left worse than right. There is been some clearing at the left base. No worsening or new finding. No effusion. No bone abnormality IMPRESSION: Patchy bilateral  pulmonary infiltrates left more than right, but with some clearing at the left base since study of 10 days ago. Electronically Signed   By: Nelson Chimes M.D.   On: 04/25/2019 00:24        Scheduled Meds: . insulin aspart  0-5 Units Subcutaneous QHS  . insulin aspart  0-9 Units Subcutaneous TID WC  . sodium chloride flush  3 mL Intravenous Q12H   Continuous Infusions: . sodium chloride    . [START ON 04/27/2019] azithromycin    . cefTRIAXone (ROCEPHIN)  IV       LOS: 0 days   Time spent= 40 mins    Hanako Tipping Arsenio Loader, MD [facility]  If 7PM-7AM, please contact night-coverage  04/26/2019, 7:28 AM

## 2019-04-26 NOTE — H&P (Signed)
History and Physical    Kenneth Escoe Sr. UJW:[id number] DOB: 1950-11-13 DOA: 04/25/2019  PCP: Kermit Balo, DO   Patient coming from: Home   Chief Complaint: Weakness, fatigue   HPI: Kenneth Swing Sr. is a 69 y.o. male with medical history significant for insulin-dependent diabetes mellitus, coronary artery disease, hypertension, and recent admission for pneumonia secondary to COVID-19, now presenting to the emergency department for evaluation of generalized weakness, fatigue, and near syncope.  Patient initially tested positive for COVID-19 on 04/09/2019 after a few days of symptoms, received treatment with antibodies as an outpatient, continued to worsen despite this, developed hypoxia, and was admitted on 04/14/2019.  He was treated with remdesivir and Decadron during the admission and discharged 1 week ago on room air.  He reports never improving much since the initial diagnosis and has begun to worsen again over the past couple days.  He was seen in the emergency department yesterday with chest pain that was said to be reproducible and evaluated with normal high-sensitivity troponins and a CTA chest is negative for PE.  Back at home, he continued to worsen with generalized weakness, fatigue, and then had a near syncopal episode.  EMS was called out to his house where he was found to be hypotensive with blood pressure 60/40, saturating in the upper 80s on room air, was given 500 cc of saline, supplemental oxygen, and brought back into the ED.  He has not had any chest pain since last night, denies any lower extremity swelling or tenderness, continues to have a nonproductive cough, and has never regained an appetite since his initial diagnosis.  He has also developed some loose stools over the past few days with no melena or hematochezia.  Denies abdominal pain or vomiting.  He has continued to take his blood pressure medications at home but did not use his insulin today due to loss of appetite and general  malaise.  ED Course: Upon arrival to the ED, patient is found to be afebrile, saturating 91% on room air, tachypneic close to 30, and with blood pressure 78/44.  EKG features a sinus rhythm with PAC.  Chest x-ray demonstrates bilateral consolidations concerning for pneumonia that appears to have worsened from yesterday.  Chemistry panel is notable for creatinine 1.35, up from an apparent baseline of roughly 1.1.  CBC is notable for an increased leukocytosis now 15,400, and a worsening thrombocytopenia with platelets now 71,000.  Lactic acid is elevated to 2.6.  Blood and urine cultures were ordered in the emergency department, patient was given 2 L of lactated Ringer's, Rocephin, and azithromycin.  Review of Systems:  All other systems reviewed and apart from HPI, are negative.  Past Medical History:  Diagnosis Date  . Arthritis    "left thumb" (05/23/2017)  . Barrett's esophagus    "we've been told that it's all gone; still take RX for GERD" (05/23/2017)  . CAD (coronary artery disease), native coronary artery    Coronary angiogram 05/03/2014:  Proximal LAD 3.0x12 mm Promus premier stent.  04/08/2014: Mid Cx 3.5 x 16 mm Promus DES.  03/29/2013: Mid LAD 2.75 x 38 mm Promus Premier drug-eluting stent, balloon angioplasty of D1 and distal LAD and stenting of distal RCA with 2.75 x 24 mm promos Premier drug-eluting stent 12/15/2012 widely patent.  . Cervicalgia   . Chronic bronchitis (HCC)    "q yr" (05/23/2017)  . Complication of anesthesia    " I do not wake up very well "  . COVID-19   .  Family history of adverse reaction to anesthesia    "son w/PONV"  . GERD (gastroesophageal reflux disease)   . Heart murmur    "grew out of it"   . Hyperlipidemia   . Hypoglycemia, unspecified   . IDDM (insulin dependent diabetes mellitus)   . Impotence of organic origin   . Iron deficiency anemia   . Other malaise and fatigue   . PONV (postoperative nausea and vomiting)   . Restless leg   . Tension  headache    "sometimes" (05/23/2017)  . Unspecified gastritis and gastroduodenitis with hemorrhage   . Unstable angina pectoris ([place]) 04/08/2014   Coronary angiogram 05/03/2014:  Proximal LAD 3.0x12 mm Promus premier stent.  04/08/2014: Mid Cx 3.5 x 16 mm Promus DES.  03/29/2013: Mid LAD 2.75 x 38 mm Promus Premier drug-eluting stent, balloon angioplasty of D1 and distal LAD and stenting of distal RCA with 2.75 x 24 mm promos Premier drug-eluting stent 12/15/2012 widely patent.    Past Surgical History:  Procedure Laterality Date  . CARDIAC CATHETERIZATION N/A 09/25/2015   Procedure: Left Heart Cath and Coronary Angiography;  Surgeon: Adrian Prows, MD;  Location: [place] [facility];  Service: Cardiovascular;  Laterality: N/A;  . CARDIAC CATHETERIZATION N/A 09/25/2015   Procedure: Intravascular Pressure Wire/FFR Study;  Surgeon: Adrian Prows, MD;  Location: [place] [facility];  Service: Cardiovascular;  Laterality: N/A;  . CORONARY ANGIOPLASTY WITH STENT PLACEMENT  12/15/2012; 04/28/2014; 05/03/2014   "2; 1; 1"   . FRACTIONAL FLOW RESERVE WIRE Right 03/26/2013   Procedure: FRACTIONAL FLOW RESERVE WIRE;  Surgeon: Laverda Page, MD;  Location: [facility] CATH LAB;  Service: Cardiovascular;  Laterality: Right;  . FRACTIONAL FLOW RESERVE WIRE N/A 05/03/2014   Procedure: FRACTIONAL FLOW RESERVE WIRE;  Surgeon: Laverda Page, MD;  Location: [facility] CATH LAB;  Service: Cardiovascular;  Laterality: N/A;  . HERNIA REPAIR  5366   "umbilical"   . INCISION AND DRAINAGE ABSCESS Left 05/2007   "groin"   . KNEE SURGERY Right 1992;  2000   "calcium deposits removed" (12/15/2012)  . LEFT HEART CATH AND CORONARY ANGIOGRAPHY N/A 05/23/2017   Procedure: LEFT HEART CATH AND CORONARY ANGIOGRAPHY;  Surgeon: Adrian Prows, MD;  Location: [place] [facility];  Service: Cardiovascular;  Laterality: N/A;  . LEFT HEART CATHETERIZATION WITH CORONARY ANGIOGRAM N/A 12/15/2012   Procedure: LEFT HEART CATHETERIZATION WITH CORONARY ANGIOGRAM;  Surgeon:  Laverda Page, MD;  Location: [facility] CATH LAB;  Service: Cardiovascular;  Laterality: N/A;  . LEFT HEART CATHETERIZATION WITH CORONARY ANGIOGRAM N/A 03/26/2013   Procedure: LEFT HEART CATHETERIZATION WITH CORONARY ANGIOGRAM;  Surgeon: Laverda Page, MD;  Location: [facility] CATH LAB;  Service: Cardiovascular;  Laterality: N/A;  . LEFT HEART CATHETERIZATION WITH CORONARY ANGIOGRAM N/A 04/08/2014   Procedure: LEFT HEART CATHETERIZATION WITH CORONARY ANGIOGRAM;  Surgeon: Blane Ohara, MD;  Location: [facility] CATH LAB;  Service: Cardiovascular;  Laterality: N/A;  . PERCUTANEOUS CORONARY STENT INTERVENTION (PCI-S) N/A 05/03/2014   Procedure: PERCUTANEOUS CORONARY STENT INTERVENTION (PCI-S);  Surgeon: Laverda Page, MD;  Location: [facility] CATH LAB;  Service: Cardiovascular;  Laterality: N/A;  . UMBILICAL HERNIA REPAIR       reports that he has never smoked. He has never used smokeless tobacco. He reports that he does not drink alcohol or use drugs.  Allergies  Allergen Reactions  . Lyrica [Pregabalin] Other (See Comments)    Made patient very lethargic the next morning, very hard to patient to function, move, etc.   .  Statins Other (See Comments)    Causes Restless Legs, rhabdomyolysis  . Metformin And Related     GI upset  . Trulicity [Dulaglutide] Other (See Comments)    GI side effects     Family History  Adopted: Yes  Family history unknown: Yes     Prior to Admission medications   Medication Sig Start Date End Date Taking? Authorizing Provider  acetaminophen (TYLENOL) 325 MG tablet Take 2 tablets (650 mg total) by mouth every 6 (six) hours as needed for mild pain or headache (fever >/= 101). 04/18/19   Elgergawy, Leana Roe, MD  albuterol (PROVENTIL HFA;VENTOLIN HFA) 108 (90 Base) MCG/ACT inhaler Inhale 2 puffs into the lungs every 6 (six) hours as needed for wheezing or shortness of breath. 06/08/18   Reed, Tiffany L, DO  Alirocumab (PRALUENT) 75 MG/ML SOAJ Inject 75 mg into the skin every 14  (fourteen) days. 03/29/19   [name], MD  aspirin 81 MG EC tablet Take 1 tablet (81 mg total) by mouth daily. 12/16/12   [name], MD  BD PEN NEEDLE NANO U/F 32G X 4 MM MISC USE TWICE DAILY WITH LANTUS PEN 06/01/18   Reed, Tiffany L, DO  benzonatate (TESSALON) 200 MG capsule Take 200 mg by mouth 3 (three) times daily as needed for cough.    [provider]  carvedilol (COREG) 3.125 MG tablet Take 1 tablet (3.125 mg total) by mouth 2 (two) times daily with a meal. 04/18/19   Elgergawy, Leana Roe, MD  CVS VITAMIN C 500 MG tablet TAKE 1 TABLET BY MOUTH TWICE DAILY 03/10/19   Reed, Tiffany L, DO  D-5000 125 MCG (5000 UT) TABS TAKE 1 TABLET BY MOUTH EVERY DAY 03/10/19   Reed, Tiffany L, DO  dexamethasone (DECADRON) 2 MG tablet Take 5 tablets (10 mg total) by mouth daily. 04/19/19   Elgergawy, Leana Roe, MD  dextromethorphan-guaiFENesin (MUCINEX DM) 30-600 MG 12hr tablet Take 1 tablet by mouth 2 (two) times daily as needed for cough.    [provider]  famotidine (PEPCID) 20 MG tablet Take 1 tablet (20 mg total) by mouth 2 (two) times daily. 04/18/19   Elgergawy, Leana Roe, MD  fluticasone (FLONASE) 50 MCG/ACT nasal spray Place 1 spray into both nostrils daily as needed for allergies or rhinitis.    [provider]  gabapentin (NEURONTIN) 100 MG capsule Take 200 mg by mouth daily after supper. 05/14/17   [provider]  Insulin Glargine (BASAGLAR KWIKPEN) 100 UNIT/ML SOPN INJECT 40 UNITS SUBCUTANEOUSLY INTO THE SKIN AT BEDTIME 04/30/18   Renato Gails, Tiffany L, DO  LIFESCAN FINEPOINT LANCETS MISC Use to test for blood sugar three times daily dx: E11.51 06/01/18   [name], NP  metFORMIN (GLUCOPHAGE) 1000 MG tablet Take 1 tablet (1,000 mg total) by mouth 2 (two) times daily with a meal. 04/22/19   Janyth Contes, Janene Harvey, NP  nitroGLYCERIN (NITROSTAT) 0.4 MG SL tablet PLACE 1 TABLET (0.4 MG TOTAL) UNDER THE TONGUE EVERY 5 (FIVE) MINUTES AS NEEDED FOR CHEST PAIN. 06/23/17   Reed,  Tiffany L, DO  ondansetron (ZOFRAN) 4 MG tablet Take 1 tablet (4 mg total) by mouth every 8 (eight) hours as needed for nausea or vomiting. 04/12/19   Ngetich, Donalee Citrin, NP  ONETOUCH ULTRA test strip USE TO TEST FOR BLOOD SUGAR THREE TIMES DAILY DX: E11.51 12/04/18   Reed, Tiffany L, DO  pantoprazole (PROTONIX) 40 MG tablet Take 40 mg by mouth daily.    [provider]  pramipexole (MIRAPEX) 1 MG tablet TAKE 2 TABLETS (2 MG TOTAL) BY MOUTH AT BEDTIME. DX G25.81 03/26/19   Reed, Tiffany L, DO  valsartan (DIOVAN) 80 MG tablet Take 1 tablet (80 mg total) by mouth daily. 03/17/19   [name], MD    Physical Exam: Vitals:   04/26/19 0130 04/26/19 0145 04/26/19 0200 04/26/19 0215  BP: 110/64 106/66 (!) 101/50 (!) 106/51  Pulse: 66 65 66 65  Resp: (!) 21 (!) 21 17 (!) 24  Temp:      TempSrc:      SpO2: 98% 98% 96% 97%  Weight:      Height:        Constitutional: Not in acute distress, tachypneic, no diaphoresis   Eyes: PERTLA, lids and conjunctivae normal ENMT: Mucous membranes are moist. Posterior pharynx clear of any exudate or lesions.   Neck: normal, supple, no masses, no thyromegaly Respiratory: Bilateral rhonchi, tachypnea. No wheezing. No pallor or cyanosis.   Cardiovascular: S1 & S2 heard, regular rate and rhythm. No extremity edema. Abdomen: No distension, no tenderness, soft. Bowel sounds active.  Musculoskeletal: no clubbing / cyanosis. No joint deformity upper and lower extremities.   Skin: no significant rashes, lesions, ulcers. Warm, dry, well-perfused. Neurologic: CN 2-12 grossly intact. Sensation intact. Strength 5/5 in all 4 limbs.  Psychiatric: Alert and oriented to person, place, time, and situation. Pleasant and cooperative.    Labs and Imaging on Admission: I have personally reviewed following labs and imaging studies  CBC: Recent Labs  Lab 04/25/19 0233 04/25/19 0258 04/25/19 2249  WBC 12.8*  --  15.4*  NEUTROABS  --   --  14.1*  HGB 13.7 13.3 13.4  HCT  40.6 39.0 39.6  MCV 90.4  --  89.4  PLT 92*  --  71*   Basic Metabolic Panel: Recent Labs  Lab 04/25/19 0233 04/25/19 0258 04/25/19 2249  NA 132* 133* 132*  K 4.2 4.2 3.8  CL 99 96* 99  CO2 25  --  22  GLUCOSE 111* 109* 158*  BUN 30* 38* 24*  CREATININE 1.35* 1.20 1.35*  CALCIUM 7.9*  --  7.8*   GFR: Estimated Creatinine Clearance: 50 mL/min (A) (by C-G formula based on SCr of 1.35 mg/dL (H)). Liver Function Tests: Recent Labs  Lab 04/25/19 0233 04/25/19 2249  AST 16 14*  ALT 23 19  ALKPHOS 42 42  BILITOT 1.1 1.1  PROT 5.2* 4.8*  ALBUMIN 2.6* 2.1*   No results for input(s): LIPASE, AMYLASE in the last 168 hours. No results for input(s): AMMONIA in the last 168 hours. Coagulation Profile: No results for input(s): INR, PROTIME in the last 168 hours. Cardiac Enzymes: No results for input(s): CKTOTAL, CKMB, CKMBINDEX, TROPONINI in the last 168 hours. BNP (last 3 results) No results for input(s): PROBNP in the last 8760 hours. HbA1C: No results for input(s): HGBA1C in the last 72 hours. CBG: No results for input(s): GLUCAP in the last 168 hours. Lipid Profile: No results for input(s): CHOL, HDL, LDLCALC, TRIG, CHOLHDL, LDLDIRECT in the last 72 hours. Thyroid Function Tests: No results for input(s): TSH, T4TOTAL, FREET4, T3FREE, THYROIDAB in the last 72 hours. Anemia Panel: No results for input(s): VITAMINB12, FOLATE, FERRITIN, TIBC, IRON, RETICCTPCT in the last 72 hours. Urine analysis:    Component Value Date/Time   COLORURINE YELLOW 04/26/2019 0112   APPEARANCEUR CLEAR 04/26/2019 0112   LABSPEC <1.005 (L) 04/26/2019 0112   PHURINE 6.0 04/26/2019 0112   GLUCOSEU NEGATIVE 04/26/2019  0112   HGBUR NEGATIVE 04/26/2019 0112   BILIRUBINUR NEGATIVE 04/26/2019 0112   KETONESUR NEGATIVE 04/26/2019 0112   PROTEINUR NEGATIVE 04/26/2019 0112   UROBILINOGEN 0.2 09/21/2013 0925   NITRITE NEGATIVE 04/26/2019 0112   LEUKOCYTESUR NEGATIVE 04/26/2019 0112   Sepsis  Labs: @LABRCNTIP (procalcitonin:4,lacticidven:4) )No results found for this or any previous visit (from the past 240 hour(s)).   Radiological Exams on Admission: CT Angio Chest PE W/Cm &/Or Wo Cm  Result Date: 04/25/2019 CLINICAL DATA:  Chest pain and shortness of breath. COVID positive, recent hospital discharge. EXAM: CT ANGIOGRAPHY CHEST WITH CONTRAST TECHNIQUE: Multidetector CT imaging of the chest was performed using the standard protocol during bolus administration of intravenous contrast. Multiplanar CT image reconstructions and MIPs were obtained to evaluate the vascular anatomy. CONTRAST:  75mL OMNIPAQUE IOHEXOL 350 MG/ML SOLN COMPARISON:  Radiograph yesterday. Radiograph 04/14/2019 FINDINGS: Cardiovascular: There are no filling defects within the pulmonary arteries to suggest pulmonary embolus. Evaluation of the subsegmental branches is limited at the lung bases due to motion. Heart is normal in size. Coronary artery calcifications/stents. The thoracic aorta is normal in caliber. No evidence of aortic dissection. Mediastinum/Nodes: Small mediastinal and bilateral hilar lymph nodes, not enlarged by size criteria. No esophageal wall thickening. No suspicious thyroid nodule. Lungs/Pleura: Multifocal linear, ground-glass, and consolidative opacities throughout both lungs. There is peripheral predominant distribution. Trace right pleural effusion. No pneumothorax. The trachea and mainstem bronchi are patent. Upper Abdomen: No acute findings. Musculoskeletal: There are no acute or suspicious osseous abnormalities. Review of the MIP images confirms the above findings. IMPRESSION: 1. No pulmonary embolus. 2. Multifocal linear, ground-glass, and consolidative opacities throughout both lungs. Findings are consistent with patient's history of COVID-19 pneumonia. Parenchymal involvement is moderate. Trace right pleural effusion. 3. Coronary artery calcifications/stents. Electronically Signed   By: Narda RutherfordMelanie   Sanford M.D.   On: 04/25/2019 03:21   DG Chest Portable 1 View  Result Date: 04/26/2019 CLINICAL DATA:  Discharge this morning, previously admitted for COVID-19 04/09/2019 EXAM: PORTABLE CHEST 1 VIEW COMPARISON:  Radiograph 04/24/2019, CT 04/25/2019 FINDINGS: Bilateral mixed reticular and consolidative airspace opacities are again noted, increased from comparison radiograph 04/24/2019 and similar to CT performed earlier the same day. No pneumothorax. No effusion. The cardiomediastinal contours are unremarkable. No acute osseous or soft tissue abnormality. Degenerative changes are present in the imaged spine and shoulders. Telemetry leads overlie the chest. IMPRESSION: Bilateral mixed reticular and consolidative airspace opacities, compatible with pneumonia, increasing from 1 day prior. Electronically Signed   By: Kreg ShropshirePrice  DeHay M.D.   On: 04/26/2019 00:19   DG Chest Portable 1 View  Result Date: 04/25/2019 CLINICAL DATA:  Left-sided chest pain over the last hour. Coronavirus infection. EXAM: PORTABLE CHEST 1 VIEW COMPARISON:  04/14/2019 FINDINGS: Heart size is normal. Coronary artery stents are visible. Widespread patchy pulmonary infiltrates again evident affecting both lungs, left worse than right. There is been some clearing at the left base. No worsening or new finding. No effusion. No bone abnormality IMPRESSION: Patchy bilateral pulmonary infiltrates left more than right, but with some clearing at the left base since study of 10 days ago. Electronically Signed   By: Paulina FusiMark  Shogry M.D.   On: 04/25/2019 00:24    EKG: Independently reviewed. Sinus rhythm, PAC.   Assessment/Plan   1. Sepsis secondary to PNA  - Patient was diagnosed with COVID-19 on [facility]09/[facility] was treated with antibodies, remdesivir, and Decadron, continued to feel sick after hospital discharge, and now presents with worsening weakness,  fatigue, and hypotension  - He is found to have increased WBC, tachypnea,  mild hypoxia, worsened infiltrates on CXR, elevated lactate, and hypotension  - Blood cultures were drawn in ED, he was given 2 liters IVF, and started on Rocephin and azithromycin for suspected secondary bacterial PNA   - Check sputum culture, strep pneumo and legionella antigens, check/trend procalcitonin, continue Rocephin and azithromycin, continue supplemental O2 as needed, and follow cultures and clinical course    2. Mild renal insufficiency  - SCr is 1.35 in ED, up from an apparent baseline of ~1.1  - He has been fluid-resuscitated in ED, will renally-dose medications, avoid nephrotoxins, and monitor    3. CAD  - No anginal complaints  - Seen in ED 2/21 with chest pain that was felt to be non-cardiac and he had normal HS troponin and CTA chest negative for PE at that time    4. Hypertension  - BP was 60/40 with EMS, improved with IVF  - Hold his antihypertensives initially    5. Insulin-dependent DM  - A1c was 9.2% in January 2021  - Managed at home with Lantus  - He has had loss of appetite, increased creatinine, and will be treated with SSI only to start    6. Thrombocytopenia  - Platelets 71k on admission without bleeding   - Patient has history of intermittently low platelets but now worse, possibly from sepsis, will monitor     DVT prophylaxis: SCD   Code Status: Full  Family Communication: Discussed with patient  Disposition Plan: Patient comes from home, has been sick for clsoe to a month now and may need physical therapy assessment prior to discharge.  Consults called: None  Admission status: Inpatient     Briscoe Deutscher, MD [facility] Pager: See [email or web address]  If 7AM-7PM, please contact the daytime attending [email or web address]  04/26/2019, 2:26 AM

## 2019-04-26 NOTE — Progress Notes (Signed)
Pharmacy Antibiotic Note  Kenneth Prashad Sr. is a 69 y.o. male recently admitted for COVID-19 PNA 2/10-2/14 re-admitted on 04/25/2019 with pneumonia.  Pharmacy has been consulted for vancomycin and Zosyn dosing.  Plan: Vancomycin 1750 mg iv once then 1250 mg IV Q 24 hrs. Goal AUC 400-550. Expected AUC: 459 SCr used: 1.28  Zosyn 3.375 g q 8 h  F/U culture results, MRSA PCR F/U renal function, levels if indicated  Height: 5\' 8"  (172.7 cm) Weight: 173 lb (78.5 kg) IBW/kg (Calculated) : 68.4  Temp (24hrs), Avg:98.7 F (37.1 C), Min:98.7 F (37.1 C), Max:98.7 F (37.1 C)  Recent Labs  Lab 04/25/19 0233 04/25/19 0258 04/25/19 2249 04/26/19 0416 04/26/19 0530  WBC 12.8*  --  15.4*  --  13.0*  CREATININE 1.35* 1.20 1.35*  --  1.28*  LATICACIDVEN  --   --  2.6* 2.5* 1.8    Estimated Creatinine Clearance: 52.7 mL/min (A) (by C-G formula based on SCr of 1.28 mg/dL (H)).    Allergies  Allergen Reactions  . Lyrica [Pregabalin] Other (See Comments)    Made patient very lethargic the next morning, very hard to patient to function, move, etc.   . Statins Other (See Comments)    Causes Restless Legs, rhabdomyolysis  . Metformin And Related     GI upset  . Trulicity [Dulaglutide] Other (See Comments)    GI side effects     Antimicrobials this admission: 2/22 CTX  X 1 2/22 azithromycin >> 2/22 vancomycin >>  2/22 Zosyn >>   Dose adjustments this admission:  Microbiology results: 2/22 BCx: ngtd UCx:   Sputum:   MRSA PCR:   Thank you for allowing pharmacy to be a part of this patient's care.  3/22 04/26/2019 8:43 AM

## 2019-04-27 ENCOUNTER — Telehealth: Payer: Medicare Other | Admitting: Family

## 2019-04-27 ENCOUNTER — Other Ambulatory Visit: Payer: Self-pay

## 2019-04-27 ENCOUNTER — Telehealth: Payer: Self-pay

## 2019-04-27 DIAGNOSIS — E7800 Pure hypercholesterolemia, unspecified: Secondary | ICD-10-CM

## 2019-04-27 DIAGNOSIS — U071 COVID-19: Secondary | ICD-10-CM

## 2019-04-27 DIAGNOSIS — R6521 Severe sepsis with septic shock: Secondary | ICD-10-CM | POA: Insufficient documentation

## 2019-04-27 DIAGNOSIS — E860 Dehydration: Secondary | ICD-10-CM

## 2019-04-27 DIAGNOSIS — D509 Iron deficiency anemia, unspecified: Secondary | ICD-10-CM | POA: Diagnosis present

## 2019-04-27 DIAGNOSIS — E785 Hyperlipidemia, unspecified: Secondary | ICD-10-CM | POA: Diagnosis present

## 2019-04-27 DIAGNOSIS — E118 Type 2 diabetes mellitus with unspecified complications: Secondary | ICD-10-CM

## 2019-04-27 DIAGNOSIS — I2510 Atherosclerotic heart disease of native coronary artery without angina pectoris: Secondary | ICD-10-CM

## 2019-04-27 DIAGNOSIS — A419 Sepsis, unspecified organism: Secondary | ICD-10-CM | POA: Diagnosis present

## 2019-04-27 DIAGNOSIS — K2270 Barrett's esophagus without dysplasia: Secondary | ICD-10-CM | POA: Diagnosis present

## 2019-04-27 DIAGNOSIS — E1165 Type 2 diabetes mellitus with hyperglycemia: Secondary | ICD-10-CM | POA: Diagnosis present

## 2019-04-27 DIAGNOSIS — R6520 Severe sepsis without septic shock: Secondary | ICD-10-CM

## 2019-04-27 DIAGNOSIS — Z794 Long term (current) use of insulin: Secondary | ICD-10-CM | POA: Diagnosis present

## 2019-04-27 DIAGNOSIS — J1282 Pneumonia due to coronavirus disease 2019: Secondary | ICD-10-CM | POA: Diagnosis present

## 2019-04-27 DIAGNOSIS — K2991 Gastroduodenitis, unspecified, with bleeding: Secondary | ICD-10-CM

## 2019-04-27 DIAGNOSIS — IMO0002 Reserved for concepts with insufficient information to code with codable children: Secondary | ICD-10-CM | POA: Diagnosis present

## 2019-04-27 DIAGNOSIS — E119 Type 2 diabetes mellitus without complications: Secondary | ICD-10-CM | POA: Diagnosis present

## 2019-04-27 DIAGNOSIS — J189 Pneumonia, unspecified organism: Secondary | ICD-10-CM | POA: Diagnosis present

## 2019-04-27 DIAGNOSIS — K2901 Acute gastritis with bleeding: Secondary | ICD-10-CM

## 2019-04-27 DIAGNOSIS — G2581 Restless legs syndrome: Secondary | ICD-10-CM | POA: Diagnosis present

## 2019-04-27 DIAGNOSIS — K209 Esophagitis, unspecified without bleeding: Secondary | ICD-10-CM | POA: Diagnosis present

## 2019-04-27 DIAGNOSIS — K2971 Gastritis, unspecified, with bleeding: Secondary | ICD-10-CM | POA: Diagnosis present

## 2019-04-27 DIAGNOSIS — I200 Unstable angina: Secondary | ICD-10-CM

## 2019-04-27 DIAGNOSIS — E871 Hypo-osmolality and hyponatremia: Secondary | ICD-10-CM

## 2019-04-27 DIAGNOSIS — I25118 Atherosclerotic heart disease of native coronary artery with other forms of angina pectoris: Secondary | ICD-10-CM

## 2019-04-27 DIAGNOSIS — N390 Urinary tract infection, site not specified: Secondary | ICD-10-CM | POA: Diagnosis present

## 2019-04-27 LAB — COMPREHENSIVE METABOLIC PANEL
ALT: 17 U/L (ref 0–44)
AST: 13 U/L — ABNORMAL LOW (ref 15–41)
Albumin: 2.3 g/dL — ABNORMAL LOW (ref 3.5–5.0)
Alkaline Phosphatase: 55 U/L (ref 38–126)
Anion gap: 8 (ref 5–15)
BUN: 21 mg/dL (ref 8–23)
CO2: 25 mmol/L (ref 22–32)
Calcium: 7.8 mg/dL — ABNORMAL LOW (ref 8.9–10.3)
Chloride: 97 mmol/L — ABNORMAL LOW (ref 98–111)
Creatinine, Ser: 1.17 mg/dL (ref 0.61–1.24)
GFR calc Af Amer: >60 mL/min (ref >60)
GFR calc non Af Amer: >60 mL/min (ref >60)
Glucose, Bld: 131 mg/dL — ABNORMAL HIGH (ref 70–99)
Potassium: 3.8 mmol/L (ref 3.5–5.1)
Sodium: 130 mmol/L — ABNORMAL LOW (ref 135–145)
Total Bilirubin: 1.1 mg/dL (ref 0.3–1.2)
Total Protein: 5.4 g/dL — ABNORMAL LOW (ref 6.5–8.1)

## 2019-04-27 LAB — CBC WITH DIFFERENTIAL/PLATELET
Abs Immature Granulocytes: 0.06 10*3/uL (ref 0.00–0.07)
Basophils Absolute: 0 10*3/uL (ref 0.0–0.1)
Basophils Relative: 0 %
Eosinophils Absolute: 0.1 10*3/uL (ref 0.0–0.5)
Eosinophils Relative: 1 %
HCT: 40.3 % (ref 39.0–52.0)
Hemoglobin: 13.4 g/dL (ref 13.0–17.0)
Immature Granulocytes: 1 %
Lymphocytes Relative: 4 %
Lymphs Abs: 0.4 10*3/uL — ABNORMAL LOW (ref 0.7–4.0)
MCH: 30.2 pg (ref 26.0–34.0)
MCHC: 33.3 g/dL (ref 30.0–36.0)
MCV: 91 fL (ref 80.0–100.0)
Monocytes Absolute: 0.5 10*3/uL (ref 0.1–1.0)
Monocytes Relative: 6 %
Neutro Abs: 7.8 10*3/uL — ABNORMAL HIGH (ref 1.7–7.7)
Neutrophils Relative %: 88 %
Platelets: 56 10*3/uL — ABNORMAL LOW (ref 150–400)
RBC: 4.43 MIL/uL (ref 4.22–5.81)
RDW: 13.4 % (ref 11.5–15.5)
WBC: 8.8 10*3/uL (ref 4.0–10.5)
nRBC: 0 % (ref 0.0–0.2)

## 2019-04-27 LAB — PROCALCITONIN: Procalcitonin: 0.17 ng/mL

## 2019-04-27 LAB — GLUCOSE, CAPILLARY
Glucose-Capillary: 142 mg/dL — ABNORMAL HIGH (ref 70–99)
Glucose-Capillary: 215 mg/dL — ABNORMAL HIGH (ref 70–99)
Glucose-Capillary: 159 mg/dL — ABNORMAL HIGH (ref 70–99)

## 2019-04-27 LAB — PHOSPHORUS: Phosphorus: 2.4 mg/dL — ABNORMAL LOW (ref 2.5–4.6)

## 2019-04-27 LAB — DIC (DISSEMINATED INTRAVASCULAR COAGULATION)PANEL
D-Dimer, Quant: 1.1 ug/mL-FEU — ABNORMAL HIGH (ref 0.00–0.50)
Fibrinogen: 783 mg/dL — ABNORMAL HIGH (ref 210–475)
INR: 1.1 (ref 0.8–1.2)
Platelets: 54 10*3/uL — ABNORMAL LOW (ref 150–400)
Prothrombin Time: 14.3 seconds (ref 11.4–15.2)
Smear Review: NONE SEEN
aPTT: 33 seconds (ref 24–36)

## 2019-04-27 LAB — STREP PNEUMONIAE URINARY ANTIGEN: Strep Pneumo Urinary Antigen: NEGATIVE

## 2019-04-27 LAB — HEPATITIS PANEL, ACUTE
HCV Ab: NONREACTIVE
Hep A IgM: NONREACTIVE
Hep B C IgM: NONREACTIVE
Hepatitis B Surface Ag: NONREACTIVE

## 2019-04-27 LAB — FERRITIN: Ferritin: 641 ng/mL — ABNORMAL HIGH (ref 24–336)

## 2019-04-27 LAB — D-DIMER, QUANTITATIVE: D-Dimer, Quant: 1.17 ug/mL-FEU — ABNORMAL HIGH (ref 0.00–0.50)

## 2019-04-27 LAB — C-REACTIVE PROTEIN: CRP: 23.7 mg/dL — ABNORMAL HIGH (ref <1.0)

## 2019-04-27 LAB — MAGNESIUM: Magnesium: 1.7 mg/dL (ref 1.7–2.4)

## 2019-04-27 MED ORDER — SODIUM CHLORIDE 0.9 % IV SOLN: INTRAVENOUS | Status: DC

## 2019-04-27 MED ORDER — DEXAMETHASONE SODIUM PHOSPHATE 10 MG/ML IJ SOLN
6.0000 mg | INTRAMUSCULAR | Status: DC
  Administered 2019-04-27 – 2019-05-01 (×5): 6 mg via INTRAVENOUS
  Filled 2019-04-27 (×5): qty 1

## 2019-04-27 MED ORDER — ZINC SULFATE 220 (50 ZN) MG PO CAPS
220.0000 mg | ORAL_CAPSULE | Freq: Every day | ORAL | Status: DC
  Administered 2019-04-27 – 2019-05-01 (×5): 220 mg via ORAL
  Filled 2019-04-27 (×5): qty 1

## 2019-04-27 MED ORDER — PRALUENT 75 MG/ML ~~LOC~~ SOAJ: 75.0000 mg | SUBCUTANEOUS | 6 refills | Status: DC

## 2019-04-27 MED ORDER — SODIUM CHLORIDE 0.9 % IV SOLN
100.0000 mg | Freq: Every day | INTRAVENOUS | Status: AC
  Administered 2019-04-27 – 2019-05-01 (×5): 100 mg via INTRAVENOUS
  Filled 2019-04-27 (×5): qty 20

## 2019-04-27 MED ORDER — INSULIN DETEMIR 100 UNIT/ML ~~LOC~~ SOLN
8.0000 [IU] | Freq: Every day | SUBCUTANEOUS | Status: DC
  Administered 2019-04-27 – 2019-04-29 (×3): 8 [IU] via SUBCUTANEOUS
  Filled 2019-04-27 (×3): qty 0.08

## 2019-04-27 MED ORDER — AZITHROMYCIN 250 MG PO TABS
500.0000 mg | ORAL_TABLET | Freq: Every day | ORAL | Status: AC
  Administered 2019-04-28 – 2019-04-30 (×3): 500 mg via ORAL
  Filled 2019-04-27 (×3): qty 2

## 2019-04-27 NOTE — Telephone Encounter (Signed)
Patient's wife called me stating that patient has been admitted to the hospital after recent Covid infection and now has developed pneumonia and respiratory distress.  I reviewed the chart, I have reassured her.  I will continue to follow him sidelines.  Advised her to contact me if she has any questions.

## 2019-04-27 NOTE — Consult Note (Signed)
   [facility] CM Inpatient Consult   04/27/2019  Kenneth Bebout Sr. September 04, 1950 [id number]   Patient screened for high risk score for unplanned readmission score and for less than 10 days re-hospitalizations.  Patient with Medicare Next Gen Accountable Care Organization [ACO] with [facility] [employer]. Chart review for barriers to care needs for care management needs.   Review of patient's medical record reveals patient per MD History and Physical notes 04/26/2019 includes but not limited to as follows:  Kenneth Podgorski Sr. is a 69 y.o. male with medical history significant for insulin-dependent diabetes mellitus, coronary artery disease, hypertension, and recent admission for pneumonia secondary to COVID-19, now presenting to the emergency department for evaluation of generalized weakness, fatigue, and near syncope.  Patient initially tested positive for COVID-19 on 04/09/2019 after a few days of symptoms, received treatment with antibodies as an outpatient, continued to worsen despite this, developed hypoxia, and was admitted on 04/14/2019.  He was treated with remdesivir and Decadron during the admission and discharged 1 week ago on room air.  He reports never improving much since the initial diagnosis and has begun to worsen again over the past couple days.  He was seen in the emergency department yesterday with chest pain that was said to be reproducible and evaluated with normal high-sensitivity troponins and a CTA chest is negative for PE.  Back at home, he continued to worsen with generalized weakness, fatigue, and then had a near syncopal episode.  EMS was called out to his house where he was found to be hypotensive with blood pressure 60/40, saturating in the upper 80s on room air, was given 500 cc of saline, supplemental oxygen, and brought back into the ED.    This writer noted Hgb A1C 9.2 on 03/09/2019 EMR chart review.  Primary Care Provider is Bufford Spikes, MD, this provider office is listed for the  Transition of Care follow up.  Pharmacy is: [facility] [address] [place]  Plan:  Will follow with inpatient [facility] team for progress and disposition needs, if appropriate. Continue to follow progress and disposition to assess for post hospital care management needs.    Please place a [facility] Care Management consult as appropriate and for questions contact:   Charlesetta Shanks, RN BSN CCM [facility]  [phone] business mobile phone Toll free office [phone]  Fax number: [phone] [email or web address]

## 2019-04-27 NOTE — Progress Notes (Addendum)
PROGRESS NOTE    Kenneth Kemler Sr.  MVH:[id number] DOB: Jun 29, 1950 DOA: 04/25/2019 PCP: Gayland Curry, DO   Brief Narrative:  69 year old WM PMHx DM type II uncontrolled with complication , HLD, CAD, HTN, unstable angina Barrett's esophagitis, cervicalgia, restless leg syndrome, iron deficiency anemia, unspecified gastritis and gastroduodenitis with hemorrhage recent COVID-19 pneumonia diagnosed on 04/09/2019    now presenting to the emergency department for evaluation of generalized weakness, fatigue, and near syncope.  Patient initially tested positive for COVID-19 on 04/09/2019 after a few days of symptoms, received treatment with antibodies as an outpatient, continued to worsen despite this, developed hypoxia, and was admitted on 04/14/2019.  He was treated with remdesivir and Decadron during the admission and discharged 1 week ago on room air.  He reports never improving much since the initial diagnosis and has begun to worsen again over the past couple days.  He was seen in the emergency department yesterday with chest pain that was said to be reproducible and evaluated with normal high-sensitivity troponins and a CTA chest is negative for PE.  Back at home, he continued to worsen with generalized weakness, fatigue, and then had a near syncopal episode.  EMS was called out to his house where he was found to be hypotensive with blood pressure 60/40, saturating in the upper 80s on room air, was given 500 cc of saline, supplemental oxygen, and brought back into the ED.  He has not had any chest pain since last night, denies any lower extremity swelling or tenderness, continues to have a nonproductive cough, and has never regained an appetite since his initial diagnosis.  He has also developed some loose stools over the past few days with no melena or hematochezia.  Denies abdominal pain or vomiting.  He has continued to take his blood pressure medications at home but did not use his insulin today due to loss  of appetite and general malaise.  ED Course: Upon arrival to the ED, patient is found to be afebrile, saturating 91% on room air, tachypneic close to 30, and with blood pressure 78/44.  EKG features a sinus rhythm with PAC.  Chest x-ray demonstrates bilateral consolidations concerning for pneumonia that appears to have worsened from yesterday.  Chemistry panel is notable for creatinine 1.35, up from an apparent baseline of roughly 1.1.  CBC is notable for an increased leukocytosis now 15,400, and a worsening thrombocytopenia with platelets now 71,000.  Lactic acid is elevated to 2.6.  Blood and urine cultures were ordered in the emergency department, patient was given 2 L of lactated Ringer's, Rocephin, and azithromycin.      Presents to the ER with generalized weakness, fatigue and near syncope.  Originally received outpatient antibiotics and was admitted to the hospital for progressive dyspnea on 04/14/2019 when he was treated with remdesivir and Decadron.  Discharged about a week ago on room air.  Still progressively worsened at home.  CTA chest done 1 day prior to this admission which was negative for PE.  This time called EMS and was found to be hypotensive and hypoxic.   Subjective: 2/23 T-max overnight 38.8 C, A/O x4, positive S OB.  Positive dry cough with deep inspiration.  Positive hoarseness.   Assessment & Plan:   Principal Problem:   Sepsis due to pneumonia [facility]) Active Problems:   Gastritis and gastroduodenitis with hemorrhage   Essential hypertension   Unstable angina (HCC)   CAD (coronary artery disease), native coronary artery   Diabetes mellitus type 2,  uncontrolled (HCC)   Mild renal insufficiency   Hyponatremia   Thrombocytopenia (HCC)   Gastritis with hemorrhage   Diabetes mellitus type 2, uncontrolled, with complications (HCC)   HLD (hyperlipidemia)   Restless leg syndrome   Chronic iron deficiency anemia   Barrett's esophagus with esophagitis   Pneumonia due  to COVID-19 virus   HCAP (healthcare-associated pneumonia)   Severe sepsis (HCC)   Severe sepsis with septic shock (HCC)  Covid pneumonia/acute respiratory failure with hypoxia COVID-19 Labs  Recent Labs    04/27/19 0337  DDIMER 1.17*  FERRITIN 641*  CRP 23.7*  -2/14 CRP on discharge 3.8  Lab Results  Component Value Date   SARSCOV2NAA POSITIVE (A) 04/14/2019   SARSCOV2NAA Not Detected 01/22/2019    -Previously already treated with antibiotics, remdesivir and Decadron.  Discharged on room air -Patient with increased WOB/S OB.  Increasing CRP, low procalcitonin, more consistent with Covid reinfection vs undertreated Covid infection vs New strain COVID infection . -Decadron 6 mg daily -Remdesivir per pharmacy protocol -Consult patient on Actemra.  Consult off label use of this medication, negative double-blind randomized trials to support its efficacy against Covid.  However significant anecdotal evidence shows improvement with its use early on in Covid infection.  Patient states negative history of hepatitis, tuberculosis, chemotherapeutic agents, or immunosuppressants.  Counseled on risk and benefits of its use.  Patient agreed to allow use of this agent if required. -Combivent -Vitamins per Covid protocol -Flutter valve -Incentive spirometer -MRSA swab negative -Acute hepatitis panel pending -Sputum pending -Titrate O2 to maintain SPO2> 88% -If required prone patient 16 hours/day; if patient cannot tolerate prone 2 to 3 hours per shift  HCAP/Septic Shock -Trend procalcitonin Results for [name], Kenneth SR. (MRN [id number]) as of 04/27/2019 13:17  Ref. Range 04/14/2019 05:40 04/26/2019 05:30 04/27/2019 02:37  Procalcitonin Latest Units: ng/mL <0.10 0.23 0.17  -Complete 7-day course antibiotics for HCAP  UTI positive staph Epidermidis -Should be covered by HCAP antibiotic  AKI (baseline Cr 0.94) Recent Labs  Lab 04/25/19 0233 04/25/19 0258 04/25/19 2249 04/26/19 0530  04/27/19 0237  CREATININE 1.35* 1.20 1.35* 1.28* 1.17  -Hydration normal saline 100 ml/hr   DM type II uncontrolled with complications -1/5 Hemoglobin A1c= 9.2 -Levemir 8 units daily -NovoLog 2 units qac -Sensitive SSI  Essential HTN -Patient relatively hypotensive holding BP meds  CAD -S/p PCI -Currently chest pain-free -ASA 81 mg daily -Statin allergy  Thrombocytopenia Results for [name], Kenneth SR. (MRN [id number]) as of 04/27/2019 13:17  Ref. Range 04/18/2019 02:55 04/25/2019 02:33 04/25/2019 22:49 04/26/2019 05:30 04/27/2019 02:37  Platelets Latest Ref Range: 150 - 400 K/uL 242 92 (L) 71 (L) 62 (L) 56 (L)  -Most likely secondary to DIC (fever, thrombocytopenia, renal, negative neurologic or anemia).  Treat underlying cause -Currently no evidence of acute bleed -DIC panel pending  GERD with Barrett's esophagus -PPI.  Restless leg syndrome -Pramipexole 1 mg@1830 /  2 mg@qhs   Hyponatremia -See AKI    DVT prophylaxis: SCD secondary thrombocytopenia Code Status: Full Family Communication: 2/23 spoke with Elita Quick (wife) counseled on plan of care, answered all questions Disposition Plan: TBD   Consultants:     Procedures/Significant Events:  2/21 CTA chest PE protocol;-negative PE. -Multifocal linear, ground-glass, and consolidative opacities throughout both lungs. Findings are consistent with patient's history of COVID-19 pneumonia. Parenchymal involvement is moderate. -Trace right pleural effusion.    I have personally reviewed and interpreted all radiology studies and my findings are as above.  VENTILATOR SETTINGS: Nasal cannula 2/23 Flow;  3 L/min SPO2; 94%    Cultures 2/22 urine positive staph Epidermidis 2/22 blood NGTD 2/22 blood LEFT AC NGTD 2/23 MRSA by PCR negative 2/23 acute hepatitis panel pending 2/23 sputum pending     Antimicrobials: Anti-infectives (From admission, onward)   Start     Dose/Rate Route Frequency Ordered Stop   04/28/19 1000   azithromycin (ZITHROMAX) tablet 500 mg     500 mg Oral Daily 04/27/19 1116 05/01/19 0959   04/27/19 1500  remdesivir 100 mg in sodium chloride 0.9 % 100 mL IVPB     100 mg 200 mL/hr over 30 Minutes Intravenous Daily 04/27/19 1338 05/02/19 0959   04/27/19 1000  vancomycin (VANCOREADY) IVPB 1250 mg/250 mL  Status:  Discontinued     1,250 mg 166.7 mL/hr over 90 Minutes Intravenous Every 24 hours 04/26/19 0855 04/27/19 1115   04/27/19 0115  azithromycin (ZITHROMAX) 500 mg in sodium chloride 0.9 % 250 mL IVPB  Status:  Discontinued     500 mg 250 mL/hr over 60 Minutes Intravenous Every 24 hours 04/26/19 0531 04/27/19 1116   04/26/19 2200  cefTRIAXone (ROCEPHIN) 2 g in sodium chloride 0.9 % 100 mL IVPB  Status:  Discontinued     2 g 200 mL/hr over 30 Minutes Intravenous Every 24 hours 04/26/19 0531 04/26/19 0742   04/26/19 0900  vancomycin (VANCOREADY) IVPB 1750 mg/350 mL     1,750 mg 175 mL/hr over 120 Minutes Intravenous  Once 04/26/19 0819 04/26/19 1723   04/26/19 0900  piperacillin-tazobactam (ZOSYN) IVPB 3.375 g     3.375 g 12.5 mL/hr over 240 Minutes Intravenous Every 8 hours 04/26/19 0819     04/26/19 0115  cefTRIAXone (ROCEPHIN) 1 g in sodium chloride 0.9 % 100 mL IVPB     1 g 200 mL/hr over 30 Minutes Intravenous  Once 04/26/19 0110 04/26/19 0208   04/26/19 0115  azithromycin (ZITHROMAX) 500 mg in sodium chloride 0.9 % 250 mL IVPB     500 mg 250 mL/hr over 60 Minutes Intravenous  Once 04/26/19 0110 04/26/19 0331      Devices    LINES / TUBES:      Continuous Infusions: . sodium chloride    . sodium chloride    . piperacillin-tazobactam (ZOSYN)  IV 3.375 g (04/27/19 0844)  . remdesivir 100 mg in NS 100 mL       Objective: Vitals:   04/27/19 0000 04/27/19 0400 04/27/19 0553 04/27/19 0700  BP: 117/71 120/75 92/60 94/61   Pulse: 78 75 66 60  Resp: 19 19 19 17   Temp: 100 F (37.8 C) (S) (!) 101.8 F (38.8 C) 98.3 F (36.8 C) 97.9 F (36.6 C)  TempSrc: Oral Oral  Oral Oral  SpO2: 92% 90% 100% 100%  Weight:      Height:        Intake/Output Summary (Last 24 hours) at 04/27/2019 1416 Last data filed at 04/27/2019 1200 Gross per 24 hour  Intake 890 ml  Output 1250 ml  Net -360 ml   Filed Weights   04/25/19 2246 04/26/19 0114  Weight: 78.5 kg 78.5 kg    Examination:  General: A/O x4, positive Acute respiratory distress Eyes: negative scleral hemorrhage, negative anisocoria, negative icterus ENT: Negative Runny nose, negative gingival bleeding, Neck:  Negative scars, masses, torticollis, lymphadenopathy, JVD Lungs:, Tachypneic decreased breath sounds bilaterally without wheezes or crackles Cardiovascular: Regular rate and rhythm without murmur gallop or rub normal S1 and S2 Abdomen: negative abdominal pain, nondistended, positive soft, bowel sounds,  no rebound, no ascites, no appreciable mass Extremities: No significant cyanosis, clubbing, or edema bilateral lower extremities Skin: Negative rashes, lesions, ulcers Psychiatric:  Negative depression, negative anxiety, negative fatigue, negative mania  Central nervous system:  Cranial nerves II through XII intact, tongue/uvula midline, all extremities muscle strength 5/5, sensation intact throughout, negative dysarthria, negative expressive aphasia, negative receptive aphasia.  .     Data Reviewed: Care during the described time interval was provided by me .  I have reviewed this patient's available data, including medical history, events of note, physical examination, and all test results as part of my evaluation.   CBC: Recent Labs  Lab 04/25/19 0233 04/25/19 0258 04/25/19 2249 04/26/19 0530 04/27/19 0237  WBC 12.8*  --  15.4* 13.0* 8.8  NEUTROABS  --   --  14.1* 11.9* 7.8*  HGB 13.7 13.3 13.4 13.8 13.4  HCT 40.6 39.0 39.6 41.3 40.3  MCV 90.4  --  89.4 90.6 91.0  PLT 92*  --  71* 62* 56*   Basic Metabolic Panel: Recent Labs  Lab 04/25/19 0233 04/25/19 0258 04/25/19 2249  04/26/19 0530 04/27/19 0237 04/27/19 0337  NA 132* 133* 132* 135 130*  --   K 4.2 4.2 3.8 4.0 3.8  --   CL 99 96* 99 100 97*  --   CO2 25  --  22 28 25   --   GLUCOSE 111* 109* 158* 84 131*  --   BUN 30* 38* 24* 21 21  --   CREATININE 1.35* 1.20 1.35* 1.28* 1.17  --   CALCIUM 7.9*  --  7.8* 7.9* 7.8*  --   MG  --   --   --  1.9  --  1.7  PHOS  --   --   --  3.2  --  2.4*   GFR: Estimated Creatinine Clearance: 57.6 mL/min (by C-G formula based on SCr of 1.17 mg/dL). Liver Function Tests: Recent Labs  Lab 04/25/19 0233 04/25/19 2249 04/26/19 0530 04/27/19 0237  AST 16 14* 13* 13*  ALT 23 19 19 17   ALKPHOS 42 42 48 55  BILITOT 1.1 1.1 0.9 1.1  PROT 5.2* 4.8* 5.3* 5.4*  ALBUMIN 2.6* 2.1* 2.5* 2.3*   No results for input(s): LIPASE, AMYLASE in the last 168 hours. No results for input(s): AMMONIA in the last 168 hours. Coagulation Profile: Recent Labs  Lab 04/26/19 0530  INR 1.2   Cardiac Enzymes: No results for input(s): CKTOTAL, CKMB, CKMBINDEX, TROPONINI in the last 168 hours. BNP (last 3 results) No results for input(s): PROBNP in the last 8760 hours. HbA1C: No results for input(s): HGBA1C in the last 72 hours. CBG: Recent Labs  Lab 04/26/19 1136 04/26/19 1514 04/26/19 2053 04/27/19 0724 04/27/19 1157  GLUCAP 169* 209* 172* 142* 215*   Lipid Profile: No results for input(s): CHOL, HDL, LDLCALC, TRIG, CHOLHDL, LDLDIRECT in the last 72 hours. Thyroid Function Tests: No results for input(s): TSH, T4TOTAL, FREET4, T3FREE, THYROIDAB in the last 72 hours. Anemia Panel: Recent Labs    04/27/19 0337  FERRITIN 641*   Urine analysis:    Component Value Date/Time   COLORURINE YELLOW 04/26/2019 0112   APPEARANCEUR CLEAR 04/26/2019 0112   LABSPEC <1.005 (L) 04/26/2019 0112   PHURINE 6.0 04/26/2019 0112   GLUCOSEU NEGATIVE 04/26/2019 0112   HGBUR NEGATIVE 04/26/2019 0112   BILIRUBINUR NEGATIVE 04/26/2019 0112   KETONESUR NEGATIVE 04/26/2019 0112   PROTEINUR  NEGATIVE 04/26/2019 0112   UROBILINOGEN 0.2 09/21/2013 0925  NITRITE NEGATIVE 04/26/2019 0112   LEUKOCYTESUR NEGATIVE 04/26/2019 0112   Sepsis Labs: @LABRCNTIP (procalcitonin:4,lacticidven:4)  ) Recent Results (from the past 240 hour(s))  Urine culture     Status: Abnormal (Preliminary result)   Collection Time: 04/26/19 12:35 AM   Specimen: In/Out Cath Urine  Result Value Ref Range Status   Specimen Description   Final    IN/OUT CATH URINE Performed at [facility], [address] [place], [place] [address]    Special Requests ADDED [phone] 04/26/2019  Final   Culture (A)  Final    30,000 COLONIES/mL STAPHYLOCOCCUS EPIDERMIDIS 10,000 COLONIES/mL GRAM NEGATIVE RODS    Report Status PENDING  Incomplete   Organism ID, Bacteria STAPHYLOCOCCUS EPIDERMIDIS (A)  Final      Susceptibility   Staphylococcus epidermidis - MIC*    CIPROFLOXACIN 1 SENSITIVE Sensitive     GENTAMICIN <=0.5 SENSITIVE Sensitive     NITROFURANTOIN <=16 SENSITIVE Sensitive     OXACILLIN <=0.25 SENSITIVE Sensitive     TETRACYCLINE >=16 RESISTANT Resistant     VANCOMYCIN 2 SENSITIVE Sensitive     TRIMETH/SULFA <=10 SENSITIVE Sensitive     CLINDAMYCIN RESISTANT Resistant     RIFAMPIN <=0.5 SENSITIVE Sensitive     Inducible Clindamycin POSITIVE Resistant     * 30,000 COLONIES/mL STAPHYLOCOCCUS EPIDERMIDIS  Blood Culture (routine x 2)     Status: None (Preliminary result)   Collection Time: 04/26/19  1:30 AM   Specimen: BLOOD  Result Value Ref Range Status   Specimen Description BLOOD SITE NOT SPECIFIED  Final   Special Requests   Final    BOTTLES DRAWN AEROBIC AND ANAEROBIC Blood Culture adequate volume   Culture   Final    NO GROWTH 1 DAY Performed at [facility], [address] [place], [place] [address]    Report Status PENDING  Incomplete  Blood Culture (routine x 2)     Status: None (Preliminary result)   Collection Time: 04/26/19  1:40 AM   Specimen: BLOOD  Result Value Ref Range  Status   Specimen Description BLOOD LEFT ANTECUBITAL  Final   Special Requests   Final    BOTTLES DRAWN AEROBIC AND ANAEROBIC Blood Culture adequate volume   Culture   Final    NO GROWTH 1 DAY Performed at [facility], [address], [place], [place] [address]    Report Status PENDING  Incomplete  MRSA PCR Screening     Status: None   Collection Time: 04/26/19  9:17 AM   Specimen: Nasal Mucosa; Nasopharyngeal  Result Value Ref Range Status   MRSA by PCR NEGATIVE NEGATIVE Final    Comment:        The GeneXpert MRSA Assay (FDA approved for NASAL specimens only), is one component of a comprehensive MRSA colonization surveillance program. It is not intended to diagnose MRSA infection nor to guide or monitor treatment for MRSA infections. Performed at [facility], [address] [place], [place] [address]          Radiology Studies: DG Chest Portable 1 View  Result Date: 04/26/2019 CLINICAL DATA:  Discharge this morning, previously admitted for COVID-19 04/09/2019 EXAM: PORTABLE CHEST 1 VIEW COMPARISON:  Radiograph 04/24/2019, CT 04/25/2019 FINDINGS: Bilateral mixed reticular and consolidative airspace opacities are again noted, increased from comparison radiograph 04/24/2019 and similar to CT performed earlier the same day. No pneumothorax. No effusion. The cardiomediastinal contours are unremarkable. No acute osseous or soft tissue abnormality. Degenerative changes are  present in the imaged spine and shoulders. Telemetry leads overlie the chest. IMPRESSION: Bilateral mixed reticular and consolidative airspace opacities, compatible with pneumonia, increasing from 1 day prior. Electronically Signed   By: Kreg Shropshire M.D.   On: 04/26/2019 00:19        Scheduled Meds: . ascorbic acid  500 mg Oral BID  . aspirin  81 mg Oral Daily  . [START ON 04/28/2019] azithromycin  500 mg Oral Daily  . cholecalciferol  1,000 Units Oral Daily  . dexamethasone  (DECADRON) injection  6 mg Intravenous Q24H  . famotidine  20 mg Oral BID  . gabapentin  200 mg Oral QPC supper  . insulin aspart  0-5 Units Subcutaneous QHS  . insulin aspart  0-9 Units Subcutaneous TID WC  . insulin aspart  2 Units Subcutaneous TID WC  . insulin detemir  8 Units Subcutaneous Daily  . Ipratropium-Albuterol  1 puff Inhalation Q6H  . pantoprazole  40 mg Oral Daily  . pramipexole  1 mg Oral QPM  . pramipexole  2 mg Oral QHS  . sodium chloride flush  3 mL Intravenous Q12H  . zinc sulfate  220 mg Oral Daily   Continuous Infusions: . sodium chloride    . sodium chloride    . piperacillin-tazobactam (ZOSYN)  IV 3.375 g (04/27/19 0844)  . remdesivir 100 mg in NS 100 mL       LOS: 1 day   The patient is critically ill with multiple organ systems failure and requires high complexity decision making for assessment and support, frequent evaluation and titration of therapies, application of advanced monitoring technologies and extensive interpretation of multiple databases. Critical Care Time devoted to patient care services described in this note  Time spent: 40 minutes     Melainie Krinsky, Roselind Messier, MD [facility] Pager [phone]  If 7PM-7AM, please contact night-coverage [email or web address] Password [facility] 04/27/2019, 2:16 PM

## 2019-04-28 LAB — URINE CULTURE: Culture: 30000 — AB

## 2019-04-28 LAB — CBC WITH DIFFERENTIAL/PLATELET
Abs Immature Granulocytes: 0.03 10*3/uL (ref 0.00–0.07)
Basophils Absolute: 0 10*3/uL (ref 0.0–0.1)
Basophils Relative: 0 %
Eosinophils Absolute: 0 10*3/uL (ref 0.0–0.5)
Eosinophils Relative: 0 %
HCT: 40.1 % (ref 39.0–52.0)
Hemoglobin: 13.1 g/dL (ref 13.0–17.0)
Immature Granulocytes: 1 %
Lymphocytes Relative: 7 %
Lymphs Abs: 0.4 10*3/uL — ABNORMAL LOW (ref 0.7–4.0)
MCH: 29.8 pg (ref 26.0–34.0)
MCHC: 32.7 g/dL (ref 30.0–36.0)
MCV: 91.3 fL (ref 80.0–100.0)
Monocytes Absolute: 0.2 10*3/uL (ref 0.1–1.0)
Monocytes Relative: 3 %
Neutro Abs: 4.7 10*3/uL (ref 1.7–7.7)
Neutrophils Relative %: 89 %
Platelets: 65 10*3/uL — ABNORMAL LOW (ref 150–400)
RBC: 4.39 MIL/uL (ref 4.22–5.81)
RDW: 13.5 % (ref 11.5–15.5)
WBC: 5.3 10*3/uL (ref 4.0–10.5)
nRBC: 0 % (ref 0.0–0.2)

## 2019-04-28 LAB — COMPREHENSIVE METABOLIC PANEL
ALT: 21 U/L (ref 0–44)
AST: 18 U/L (ref 15–41)
Albumin: 2.4 g/dL — ABNORMAL LOW (ref 3.5–5.0)
Alkaline Phosphatase: 64 U/L (ref 38–126)
Anion gap: 10 (ref 5–15)
BUN: 22 mg/dL (ref 8–23)
CO2: 24 mmol/L (ref 22–32)
Calcium: 8.2 mg/dL — ABNORMAL LOW (ref 8.9–10.3)
Chloride: 102 mmol/L (ref 98–111)
Creatinine, Ser: 1.07 mg/dL (ref 0.61–1.24)
GFR calc Af Amer: >60 mL/min (ref >60)
GFR calc non Af Amer: >60 mL/min (ref >60)
Glucose, Bld: 151 mg/dL — ABNORMAL HIGH (ref 70–99)
Potassium: 4.6 mmol/L (ref 3.5–5.1)
Sodium: 136 mmol/L (ref 135–145)
Total Bilirubin: 0.8 mg/dL (ref 0.3–1.2)
Total Protein: 5.9 g/dL — ABNORMAL LOW (ref 6.5–8.1)

## 2019-04-28 LAB — MAGNESIUM: Magnesium: 2.1 mg/dL (ref 1.7–2.4)

## 2019-04-28 LAB — C-REACTIVE PROTEIN: CRP: 23.6 mg/dL — ABNORMAL HIGH (ref <1.0)

## 2019-04-28 LAB — GLUCOSE, CAPILLARY
Glucose-Capillary: 207 mg/dL — ABNORMAL HIGH (ref 70–99)
Glucose-Capillary: 162 mg/dL — ABNORMAL HIGH (ref 70–99)
Glucose-Capillary: 311 mg/dL — ABNORMAL HIGH (ref 70–99)
Glucose-Capillary: 271 mg/dL — ABNORMAL HIGH (ref 70–99)
Glucose-Capillary: 392 mg/dL — ABNORMAL HIGH (ref 70–99)

## 2019-04-28 LAB — D-DIMER, QUANTITATIVE: D-Dimer, Quant: 1.23 ug/mL-FEU — ABNORMAL HIGH (ref 0.00–0.50)

## 2019-04-28 LAB — LEGIONELLA PNEUMOPHILA SEROGP 1 UR AG: L. pneumophila Serogp 1 Ur Ag: NEGATIVE

## 2019-04-28 LAB — FERRITIN: Ferritin: 927 ng/mL — ABNORMAL HIGH (ref 24–336)

## 2019-04-28 LAB — PHOSPHORUS: Phosphorus: 4.1 mg/dL (ref 2.5–4.6)

## 2019-04-28 MED ORDER — HYDROCOD POLST-CPM POLST ER 10-8 MG/5ML PO SUER
5.0000 mL | Freq: Two times a day (BID) | ORAL | Status: DC | PRN
  Administered 2019-04-28 – 2019-04-30 (×3): 5 mL via ORAL
  Filled 2019-04-28 (×3): qty 5

## 2019-04-28 NOTE — Evaluation (Signed)
Physical Therapy Evaluation Patient Details Name: Kenneth Kazee Sr. MRN: [id number] DOB: 1950-06-02 Today's Date: 04/28/2019   History of Present Illness  69 year old male admitted with sepsis due to pneumonia/ COVID; PMH uncontrolled DM, CAD, HTN, unstable anging, Barrett's esophagitis, and hemorrhage.  Clinical Impression  Very pleasant  Male patient admitted with COVID and sepsis due to PNA. He is married and lives with spouse in one level house, 2 outside steps and handrails, walk in shower. Does not have grab bars, commode is regular height, NO AD for ambulation . Is currently on RA. 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 Loves yardwork. Says his wife and son both have had COVID but are well now. Should benefit from PT to address strength, endurance and increasing ambulation. Practiced and was instructed in printed HEP and this with theraband was issued to the patient. Also reviewed IS and Flutter valve unit with good return demo.     Follow Up Recommendations No PT follow up    Equipment Recommendations       Recommendations for Other Services       Precautions / Restrictions Precautions Precautions: None Restrictions Weight Bearing Restrictions: No      Mobility  Bed Mobility Overal bed mobility: Independent                Transfers Overall transfer level: Independent Equipment used: None                Ambulation/Gait Ambulation/Gait assistance: Supervision Gait Distance (Feet): 55 Feet(In room, steadying self with IV pole. On RA) Assistive device: None(Steadying self with IV pole, On RA) Gait Pattern/deviations: WFL(Within Functional Limits)   Gait velocity interpretation: 1.31 - 2.62 ft/sec, indicative of limited community Insurance account manager Rankin (Stroke Patients Only)       Balance Overall balance assessment: Independent                                            Pertinent Vitals/Pain Pain Assessment: No/denies pain    Home Living Family/patient expects to be discharged to:: Private residence   Available Help at Discharge: Family         Home Layout: One level Home Equipment: None      Prior Function Level of Independence: Independent               Hand Dominance   Dominant Hand: Right    Extremity/Trunk Assessment        Lower Extremity Assessment Lower Extremity Assessment: Generalized weakness    Cervical / Trunk Assessment Cervical / Trunk Assessment: Normal  Communication   Communication: No difficulties(does have raspy voice from Barrett;s esophagitis)  Cognition Arousal/Alertness: Awake/alert Behavior During Therapy: WFL for tasks assessed/performed Overall Cognitive Status: Within Functional Limits for tasks assessed                                        General Comments      Exercises General Exercises - Upper Extremity Shoulder Flexion: AROM;Seated;Theraband;Both Theraband Level (Shoulder Flexion): Level 1 (Yellow)(Orange) Shoulder ABduction: AROM;Seated;Theraband Shoulder Horizontal ABduction: AROM;Seated;Theraband Elbow Flexion: AROM;Seated;Theraband Theraband Level (Elbow Flexion): Level 1 (  Yellow)(orange) Elbow Extension: AROM;Seated;Theraband Theraband Level (Elbow Extension): Level 1 (Yellow)(Orange) General Exercises - Lower Extremity Ankle Circles/Pumps: AROM;Seated;Both Quad Sets: AROM;Seated;Both Short Arc Quad: AROM;Seated;Both Hip ABduction/ADduction: AROM;Seated;Both Straight Leg Raises: AROM;Seated;Both Hip Flexion/Marching: AROM;Seated;Both Other Exercises Other Exercises: Practiced IS and Flutter valve unit with good return demo on both. !0 reps each Other Exercises: Issued printed ex sheet as well as orange theraband for basic HEP   Assessment/Plan    PT Assessment Patient needs continued PT services  PT Problem List  Decreased strength;Decreased activity tolerance;Cardiopulmonary status limiting activity       PT Treatment Interventions Gait training;Functional mobility training;Patient/family education;Therapeutic activities;Therapeutic exercise    PT Goals (Current goals can be found in the Care Plan section)  Acute Rehab PT Goals Patient Stated Goal: Just to get well and will need to clean up my yard from all the dry limbs etc- likes yardwork PT Goal Formulation: With patient Time For Goal Achievement: 05/12/19 Potential to Achieve Goals: Good    Frequency Min 4X/week   Barriers to discharge        Co-evaluation               AM-PAC PT "6 Clicks" Mobility  Outcome Measure Help needed turning from your back to your side while in a flat bed without using bedrails?: None Help needed moving from lying on your back to sitting on the side of a flat bed without using bedrails?: None Help needed moving to and from a bed to a chair (including a wheelchair)?: None Help needed standing up from a chair using your arms (e.g., wheelchair or bedside chair)?: A Little Help needed to walk in hospital room?: A Little(primarily close supervision) Help needed climbing 3-5 steps with a railing? : A Little 6 Click Score: 21    End of Session   Activity Tolerance: Patient tolerated treatment well Patient left: in chair;with call bell/phone within reach Nurse Communication: Mobility status PT Visit Diagnosis: Muscle weakness (generalized) (M62.81)    Time: 3094-0768 PT Time Calculation (min) (ACUTE ONLY): 44 min   Charges:   PT Evaluation $PT Eval Low Complexity: 1 Low PT Treatments $Gait Training: 8-22 mins $Therapeutic Exercise: 8-22 mins        [name], PT # [phone] CGV cell  Casandra Doffing 04/28/2019, 2:48 PM

## 2019-04-28 NOTE — Progress Notes (Signed)
PROGRESS NOTE    Kenneth Paulding Sr.  TOI:[id number] DOB: 1950/05/14 DOA: 04/25/2019 PCP: Kermit Balo, DO   Brief Narrative:  69 year old WM PMHx DM type II uncontrolled with complication , HLD, CAD, HTN, unstable angina Barrett's esophagitis, cervicalgia, restless leg syndrome, iron deficiency anemia, unspecified gastritis and gastroduodenitis with hemorrhage recent COVID-19 pneumonia diagnosed on 04/09/2019 now presenting to the emergency department for evaluation of generalized weakness, fatigue, and near syncope.  Patient initially tested positive for COVID-19 on 04/09/2019 after a few days of symptoms, received treatment with antibodies as an outpatient, continued to worsen despite this, developed hypoxia, and was admitted on 04/14/2019.  He was treated with remdesivir and Decadron during the admission and discharged 1 week ago on room air.  He reports never improving much since the initial diagnosis and has begun to worsen again over the past couple days.  He was seen in the emergency department yesterday with chest pain that was said to be reproducible and evaluated with normal high-sensitivity troponins and a CTA chest is negative for PE.  Back at home, he continued to worsen with generalized weakness, fatigue, and then had a near syncopal episode.  EMS was called out to his house where he was found to be hypotensive with blood pressure 60/40, saturating in the upper 80s on room air, was given 500 cc of saline, supplemental oxygen, and brought back into the ED.  He has not had any chest pain since last night, denies any lower extremity swelling or tenderness, continues to have a nonproductive cough, and has never regained an appetite since his initial diagnosis.  He has also developed some loose stools over the past few days with no melena or hematochezia.  Denies abdominal pain or vomiting.  He has continued to take his blood pressure medications at home but did not use his insulin today due to loss of  appetite and general malaise. Upon arrival to the ED, patient is found to be afebrile, saturating 91% on room air, tachypneic close to 30, and with blood pressure 78/44.  EKG features a sinus rhythm with PAC.  Chest x-ray demonstrates bilateral consolidations concerning for pneumonia that appears to have worsened from yesterday.  Chemistry panel is notable for creatinine 1.35, up from an apparent baseline of roughly 1.1.  CBC is notable for an increased leukocytosis now 15,400, and a worsening thrombocytopenia with platelets now 71,000.  Lactic acid is elevated to 2.6.  Blood and urine cultures were ordered in the emergency department, patient was given 2 L of lactated Ringer's, Rocephin, and azithromycin.  Subjective: Continues to have low grade fever overnight, otherwise feeling somewhat improved. Denies headache, fever, chills, nausea, vomiting, diarrhea or constipation.  Assessment & Plan:   Principal Problem:   Sepsis due to pneumonia [facility]) Active Problems:   Gastritis and gastroduodenitis with hemorrhage   Essential hypertension   Unstable angina (HCC)   CAD (coronary artery disease), native coronary artery   Diabetes mellitus type 2, uncontrolled (HCC)   Mild renal insufficiency   Hyponatremia   Thrombocytopenia (HCC)   Gastritis with hemorrhage   Diabetes mellitus type 2, uncontrolled, with complications (HCC)   HLD (hyperlipidemia)   Restless leg syndrome   Chronic iron deficiency anemia   Barrett's esophagus with esophagitis   Pneumonia due to COVID-19 virus   HCAP (healthcare-associated pneumonia)   Severe sepsis (HCC)   Severe sepsis with septic shock (HCC)   Acute lower UTI  Covid pneumonia/acute respiratory failure with hypoxia Failure of outpatient antibiotics  Recent Labs    04/27/19 0337 04/27/19 1430 04/28/19 0332  DDIMER 1.17* 1.10* 1.23*  FERRITIN 641*  --  927*  CRP 23.7*  --  23.6*  - 2/14 CRP 3.8 @ discharge Lab Results  Component Value Date    SARSCOV2NAA POSITIVE (A) 04/14/2019   SARSCOV2NAA Not Detected 01/22/2019  - Failure of outpatient antibiotics (?Azithromycin), Remdesivir and Decadron. Previously discharged on room air 04/18/19 - Continue zosyn x 5 days minimum pending clinical status one could justify full 7 day course as below - Patient with increased WOB/S OB.  Increasing CRP - Decadron 6 mg daily - Remdesivir 100mg  x5 days - Combivent, vitamin C, flutter valve, incentive spirometer as tolerated - MRSA swab negative - hold vancomycin - Titrate O2 to maintain SPO2> 88% - If possible prone patient 16 hours/day; if patient cannot tolerate prone 2 to 3 hours per shift  HCAP/Sepsis POA - Trend procalcitonin - minimally elevated at admission in the setting of outpatient abx - Complete 5 to 7-day course antibiotics for HCAP/post viral bacterial pna - Patient does not meet criteria for shock given no need for pressors  UTI positive staph Epidermidis, cannot rule out UTI POA - Should be covered by HCAP antibiotic  AKI (Baseline Cr 0.94) resolved Recent Labs  Lab 04/25/19 0258 04/25/19 2249 04/26/19 0530 04/27/19 0237 04/28/19 0332  CREATININE 1.20 1.35* 1.28* 1.17 1.07  - Hydration normal saline 100 ml/hr  DM type II uncontrolled with complications - 1/5 Hemoglobin A1c= 9.2 - Levemir 8 units daily - NovoLog 2 units qac - Sensitive SSI  Essential HTN - Patient relatively hypotensive holding BP meds - Resume once indicated  CAD - S/P PCI - Currently chest pain-free - ASA 81 mg daily - Statin allergy  Thrombocytopenia, stable - Platelets stabilizing with treatment of infection as above - Questionably DIC (fever, thrombocytopenia, AKI, negative neurologic or anemia). Treat underlying cause - Currently no evidence of acute bleed - DIC panel:     - protime/INR - WNL     - PTT - WNL     - Fibrinogen - elevated (although in the setting of recent covid)     - D-dimer - minimally elevated 1.2 (again in the  setting of covid)  GERD with Barrett's esophagus - Continue PPI.  Restless leg syndrome - Pramipexole 1 mg@1830 /2 mg@qhs   Hyponatremia - See AKI  DVT prophylaxis: SCD secondary thrombocytopenia Code Status: Full Family Communication: Pam (wife) updated on plan of care, answered all questions Disposition Plan: TBD - pending clinical course, resolution of hypoxia, sepsis criteria and physical therapy.  Procedures/Significant Events:  2/21 CTA chest PE protocol;-negative PE. -Multifocal linear, ground-glass, and consolidative opacities throughout both lungs. Findings are consistent with patient's history of COVID-19 pneumonia. Parenchymal involvement is moderate. -Trace right pleural effusion.  I have personally reviewed and interpreted all radiology studies and my findings are as above.  Cultures 2/22 urine positive staph Epidermidis 2/22 blood NGTD 2/22 blood LEFT AC NGTD 2/23 MRSA by PCR negative 2/23 acute hepatitis panel pending 2/23 sputum pending  Antimicrobials: Anti-infectives (From admission, onward)   Start     Dose/Rate Route Frequency Ordered Stop   04/28/19 1000  azithromycin (ZITHROMAX) tablet 500 mg     500 mg Oral Daily 04/27/19 1116 05/01/19 0959   04/27/19 1500  remdesivir 100 mg in sodium chloride 0.9 % 100 mL IVPB     100 mg 200 mL/hr over 30 Minutes Intravenous Daily 04/27/19 1338 05/02/19 0959   04/27/19 1000  vancomycin (VANCOREADY) IVPB 1250 mg/250 mL  Status:  Discontinued     1,250 mg 166.7 mL/hr over 90 Minutes Intravenous Every 24 hours 04/26/19 0855 04/27/19 1115   04/27/19 0115  azithromycin (ZITHROMAX) 500 mg in sodium chloride 0.9 % 250 mL IVPB  Status:  Discontinued     500 mg 250 mL/hr over 60 Minutes Intravenous Every 24 hours 04/26/19 0531 04/27/19 1116   04/26/19 2200  cefTRIAXone (ROCEPHIN) 2 g in sodium chloride 0.9 % 100 mL IVPB  Status:  Discontinued     2 g 200 mL/hr over 30 Minutes Intravenous Every 24 hours 04/26/19 0531  04/26/19 0742   04/26/19 0900  vancomycin (VANCOREADY) IVPB 1750 mg/350 mL     1,750 mg 175 mL/hr over 120 Minutes Intravenous  Once 04/26/19 0819 04/26/19 1723   04/26/19 0900  piperacillin-tazobactam (ZOSYN) IVPB 3.375 g     3.375 g 12.5 mL/hr over 240 Minutes Intravenous Every 8 hours 04/26/19 0819     04/26/19 0115  cefTRIAXone (ROCEPHIN) 1 g in sodium chloride 0.9 % 100 mL IVPB     1 g 200 mL/hr over 30 Minutes Intravenous  Once 04/26/19 0110 04/26/19 0208   04/26/19 0115  azithromycin (ZITHROMAX) 500 mg in sodium chloride 0.9 % 250 mL IVPB     500 mg 250 mL/hr over 60 Minutes Intravenous  Once 04/26/19 0110 04/26/19 0331     Continuous Infusions: . sodium chloride    . sodium chloride 100 mL/hr at 04/27/19 1849  . piperacillin-tazobactam (ZOSYN)  IV 3.375 g (04/28/19 0029)  . remdesivir 100 mg in NS 100 mL 200 mL/hr at 04/27/19 1900    Objective: Vitals:   04/27/19 2012 04/27/19 2100 04/28/19 0028 04/28/19 0339  BP: 101/64 104/67 100/73 103/90  Pulse:  (!) 57 (!) 56 (!) 55  Resp: 20     Temp: 98.5 F (36.9 C)   97.6 F (36.4 C)  TempSrc: Oral   Oral  SpO2: 93%  91% 92%  Weight:      Height:        Intake/Output Summary (Last 24 hours) at 04/28/2019 0745 Last data filed at 04/28/2019 0500 Gross per 24 hour  Intake 1646.28 ml  Output 3550 ml  Net -1903.72 ml   Filed Weights   04/25/19 2246 04/26/19 0114  Weight: 78.5 kg 78.5 kg    Examination:  General: A/O x4, positive Acute respiratory distress Eyes: negative scleral hemorrhage, negative anisocoria, negative icterus ENT: Negative Runny nose, negative gingival bleeding, Neck:  Negative scars, masses, torticollis, lymphadenopathy, JVD Lungs:, Tachypneic decreased breath sounds bilaterally without wheezes or crackles Cardiovascular: Regular rate and rhythm without murmur gallop or rub normal S1 and S2 Abdomen: negative abdominal pain, nondistended, positive soft, bowel sounds, no rebound, no ascites, no  appreciable mass Extremities: No significant cyanosis, clubbing, or edema bilateral lower extremities Skin: Negative rashes, lesions, ulcers Central nervous system:  Cranial nerves II through XII intact, tongue/uvula midline, all extremities muscle strength 5/5, sensation intact throughout, negative dysarthria, negative expressive aphasia, negative receptive aphasia.  Data Reviewed: Care during the described time interval was provided by me .  I have reviewed this patient's available data, including medical history, events of note, physical examination, and all test results as part of my evaluation.  CBC: Recent Labs  Lab 04/25/19 0233 04/25/19 0233 04/25/19 0258 04/25/19 2249 04/26/19 0530 04/27/19 0237 04/27/19 1430 04/28/19 0332  WBC 12.8*  --   --  15.4* 13.0* 8.8  --  5.3  NEUTROABS  --   --   --  14.1* 11.9* 7.8*  --  4.7  HGB 13.7   < > 13.3 13.4 13.8 13.4  --  13.1  HCT 40.6   < > 39.0 39.6 41.3 40.3  --  40.1  MCV 90.4  --   --  89.4 90.6 91.0  --  91.3  PLT 92*   < >  --  71* 62* 56* 54* 65*   < > = values in this interval not displayed.   Basic Metabolic Panel: Recent Labs  Lab 04/25/19 0233 04/25/19 0233 04/25/19 0258 04/25/19 2249 04/26/19 0530 04/27/19 0237 04/27/19 0337 04/28/19 0332  NA 132*   < > 133* 132* 135 130*  --  136  K 4.2   < > 4.2 3.8 4.0 3.8  --  4.6  CL 99   < > 96* 99 100 97*  --  102  CO2 25  --   --  22 28 25   --  24  GLUCOSE 111*   < > 109* 158* 84 131*  --  151*  BUN 30*   < > 38* 24* 21 21  --  22  CREATININE 1.35*   < > 1.20 1.35* 1.28* 1.17  --  1.07  CALCIUM 7.9*  --   --  7.8* 7.9* 7.8*  --  8.2*  MG  --   --   --   --  1.9  --  1.7 2.1  PHOS  --   --   --   --  3.2  --  2.4* 4.1   < > = values in this interval not displayed.   GFR: Estimated Creatinine Clearance: 63 mL/min (by C-G formula based on SCr of 1.07 mg/dL).  Liver Function Tests: Recent Labs  Lab 04/25/19 0233 04/25/19 2249 04/26/19 0530 04/27/19 0237  04/28/19 0332  AST 16 14* 13* 13* 18  ALT 23 19 19 17 21   ALKPHOS 42 42 48 55 64  BILITOT 1.1 1.1 0.9 1.1 0.8  PROT 5.2* 4.8* 5.3* 5.4* 5.9*  ALBUMIN 2.6* 2.1* 2.5* 2.3* 2.4*   Coagulation Profile: Recent Labs  Lab 04/26/19 0530 04/27/19 1430  INR 1.2 1.1   CBG: Recent Labs  Lab 04/26/19 1514 04/26/19 2053 04/27/19 0724 04/27/19 1157 04/27/19 2010  GLUCAP 209* 172* 142* 215* 159*   Anemia Panel: Recent Labs    04/27/19 0337 04/28/19 0332  FERRITIN 641* 927*   Urine analysis:    Component Value Date/Time   COLORURINE YELLOW 04/26/2019 0112   APPEARANCEUR CLEAR 04/26/2019 0112   LABSPEC <1.005 (L) 04/26/2019 0112   PHURINE 6.0 04/26/2019 0112   GLUCOSEU NEGATIVE 04/26/2019 0112   HGBUR NEGATIVE 04/26/2019 0112   BILIRUBINUR NEGATIVE 04/26/2019 0112   KETONESUR NEGATIVE 04/26/2019 0112   PROTEINUR NEGATIVE 04/26/2019 0112   UROBILINOGEN 0.2 09/21/2013 0925   NITRITE NEGATIVE 04/26/2019 0112   LEUKOCYTESUR NEGATIVE 04/26/2019 0112    Recent Results (from the past 240 hour(s))  Urine culture     Status: Abnormal   Collection Time: 04/26/19 12:35 AM   Specimen: In/Out Cath Urine  Result Value Ref Range Status   Specimen Description   Final    IN/OUT CATH URINE Performed at [facility], [place] [address], [place], [place] [address]    Special Requests ADDED [id number] 04/26/2019  Final   Culture (A)  Final    30,000 COLONIES/mL STAPHYLOCOCCUS EPIDERMIDIS 10,000 COLONIES/mL ESCHERICHIA COLI    Report  Status 04/28/2019 FINAL  Final   Organism ID, Bacteria STAPHYLOCOCCUS EPIDERMIDIS (A)  Final   Organism ID, Bacteria ESCHERICHIA COLI (A)  Final      Susceptibility   Escherichia coli - MIC*    AMPICILLIN 8 SENSITIVE Sensitive     CEFAZOLIN <=4 SENSITIVE Sensitive     CEFTRIAXONE <=0.25 SENSITIVE Sensitive     CIPROFLOXACIN <=0.25 SENSITIVE Sensitive     GENTAMICIN <=1 SENSITIVE Sensitive     IMIPENEM <=0.25 SENSITIVE Sensitive     NITROFURANTOIN 32  SENSITIVE Sensitive     TRIMETH/SULFA <=20 SENSITIVE Sensitive     AMPICILLIN/SULBACTAM 4 SENSITIVE Sensitive     PIP/TAZO <=4 SENSITIVE Sensitive     * 10,000 COLONIES/mL ESCHERICHIA COLI   Staphylococcus epidermidis - MIC*    CIPROFLOXACIN 1 SENSITIVE Sensitive     GENTAMICIN <=0.5 SENSITIVE Sensitive     NITROFURANTOIN <=16 SENSITIVE Sensitive     OXACILLIN <=0.25 SENSITIVE Sensitive     TETRACYCLINE >=16 RESISTANT Resistant     VANCOMYCIN 2 SENSITIVE Sensitive     TRIMETH/SULFA <=10 SENSITIVE Sensitive     CLINDAMYCIN RESISTANT Resistant     RIFAMPIN <=0.5 SENSITIVE Sensitive     Inducible Clindamycin POSITIVE Resistant     * 30,000 COLONIES/mL STAPHYLOCOCCUS EPIDERMIDIS  Blood Culture (routine x 2)     Status: None (Preliminary result)   Collection Time: 04/26/19  1:30 AM   Specimen: BLOOD  Result Value Ref Range Status   Specimen Description BLOOD SITE NOT SPECIFIED  Final   Special Requests   Final    BOTTLES DRAWN AEROBIC AND ANAEROBIC Blood Culture adequate volume   Culture   Final    NO GROWTH 1 DAY Performed at [facility], [address] [place], [place] [address]    Report Status PENDING  Incomplete  Blood Culture (routine x 2)     Status: None (Preliminary result)   Collection Time: 04/26/19  1:40 AM   Specimen: BLOOD  Result Value Ref Range Status   Specimen Description BLOOD LEFT ANTECUBITAL  Final   Special Requests   Final    BOTTLES DRAWN AEROBIC AND ANAEROBIC Blood Culture adequate volume   Culture   Final    NO GROWTH 1 DAY Performed at [facility], [address] [place], [place] [address]    Report Status PENDING  Incomplete  MRSA PCR Screening     Status: None   Collection Time: 04/26/19  9:17 AM   Specimen: Nasal Mucosa; Nasopharyngeal  Result Value Ref Range Status   MRSA by PCR NEGATIVE NEGATIVE Final    Comment:        The GeneXpert MRSA Assay (FDA approved for NASAL specimens only), is one component of a comprehensive MRSA  colonization surveillance program. It is not intended to diagnose MRSA infection nor to guide or monitor treatment for MRSA infections. Performed at [facility], [address] [place], [place] [address]      Radiology Studies: No results found.  Scheduled Meds: . ascorbic acid  500 mg Oral BID  . aspirin  81 mg Oral Daily  . azithromycin  500 mg Oral Daily  . cholecalciferol  1,000 Units Oral Daily  . dexamethasone (DECADRON) injection  6 mg Intravenous Q24H  . famotidine  20 mg Oral BID  . gabapentin  200 mg Oral QPC supper  . insulin aspart  0-5 Units Subcutaneous QHS  . insulin aspart  0-9 Units Subcutaneous TID WC  .  insulin aspart  2 Units Subcutaneous TID WC  . insulin detemir  8 Units Subcutaneous Daily  . Ipratropium-Albuterol  1 puff Inhalation Q6H  . pantoprazole  40 mg Oral Daily  . pramipexole  1 mg Oral QPM  . pramipexole  2 mg Oral QHS  . sodium chloride flush  3 mL Intravenous Q12H  . zinc sulfate  220 mg Oral Daily   Continuous Infusions: . sodium chloride    . sodium chloride 100 mL/hr at 04/27/19 1849  . piperacillin-tazobactam (ZOSYN)  IV 3.375 g (04/28/19 0029)  . remdesivir 100 mg in NS 100 mL 200 mL/hr at 04/27/19 1900     LOS: 2 days   Time spent: 40 minutes  Azucena Fallen, DO [facility]  If 7PM-7AM, please contact night-coverage [email or web address]  04/28/2019, 7:45 AM

## 2019-04-28 NOTE — Plan of Care (Addendum)
Patient up to chair. No s/s of pain or distress. All medication given well tolerated. Spoke to wife with daily update. Will continue to monitor for remainder of shift.  Problem: Education: Goal: Knowledge of risk factors and measures for prevention of condition will improve 04/28/2019 0904 by Winnifred Friar, RN Outcome: Progressing 04/28/2019 0904 by Winnifred Friar, RN Outcome: Progressing 04/28/2019 0904 by Winnifred Friar, RN Outcome: Progressing   Problem: Coping: Goal: Psychosocial and spiritual needs will be supported 04/28/2019 0904 by Winnifred Friar, RN Outcome: Progressing 04/28/2019 0904 by Winnifred Friar, RN Outcome: Progressing 04/28/2019 0904 by Winnifred Friar, RN Outcome: Progressing   Problem: Respiratory: Goal: Will maintain a patent airway 04/28/2019 0904 by Winnifred Friar, RN Outcome: Progressing 04/28/2019 0904 by Winnifred Friar, RN Outcome: Progressing 04/28/2019 0904 by Winnifred Friar, RN Outcome: Progressing Goal: Complications related to the disease process, condition or treatment will be avoided or minimized 04/28/2019 0904 by Winnifred Friar, RN Outcome: Progressing 04/28/2019 0904 by Winnifred Friar, RN Outcome: Progressing 04/28/2019 0904 by Winnifred Friar, RN Outcome: Progressing   Problem: Education: Goal: Knowledge of General Education information will improve Description: Including pain rating scale, medication(s)/side effects and non-pharmacologic comfort measures 04/28/2019 0904 by Winnifred Friar, RN Outcome: Progressing 04/28/2019 0904 by Winnifred Friar, RN Outcome: Progressing 04/28/2019 0904 by Winnifred Friar, RN Outcome: Progressing   Problem: Health Behavior/Discharge Planning: Goal: Ability to manage health-related needs will improve 04/28/2019 0904 by Winnifred Friar, RN Outcome: Progressing 04/28/2019 0904 by Winnifred Friar, RN Outcome: Progressing 04/28/2019 0904 by Winnifred Friar, RN Outcome: Progressing   Problem: Clinical Measurements: Goal: Ability to maintain clinical measurements within normal limits will improve 04/28/2019 0904 by Winnifred Friar, RN Outcome: Progressing 04/28/2019 0904 by Winnifred Friar, RN Outcome: Progressing 04/28/2019 0904 by Winnifred Friar, RN Outcome: Progressing Goal: Will remain free from infection 04/28/2019 0904 by Winnifred Friar, RN Outcome: Progressing 04/28/2019 0904 by Winnifred Friar, RN Outcome: Progressing 04/28/2019 0904 by Winnifred Friar, RN Outcome: Progressing Goal: Diagnostic test results will improve 04/28/2019 0904 by Winnifred Friar, RN Outcome: Progressing 04/28/2019 0904 by Winnifred Friar, RN Outcome: Progressing 04/28/2019 0904 by Winnifred Friar, RN Outcome: Progressing Goal: Respiratory complications will improve 04/28/2019 0904 by Winnifred Friar, RN Outcome: Progressing 04/28/2019 0904 by Winnifred Friar, RN Outcome: Progressing 04/28/2019 0904 by Winnifred Friar, RN Outcome: Progressing Goal: Cardiovascular complication will be avoided 04/28/2019 0904 by Winnifred Friar, RN Outcome: Progressing 04/28/2019 0904 by Winnifred Friar, RN Outcome: Progressing 04/28/2019 0904 by Winnifred Friar, RN Outcome: Progressing   Problem: Activity: Goal: Risk for activity intolerance will decrease 04/28/2019 0904 by Winnifred Friar, RN Outcome: Progressing 04/28/2019 0904 by Winnifred Friar, RN Outcome: Progressing 04/28/2019 0904 by Winnifred Friar, RN Outcome: Progressing   Problem: Nutrition: Goal: Adequate nutrition will be maintained 04/28/2019 0904 by Winnifred Friar, RN Outcome: Progressing 04/28/2019 0904 by Winnifred Friar, RN Outcome: Progressing 04/28/2019 0904 by Winnifred Friar, RN Outcome: Progressing   Problem: Coping: Goal: Level of anxiety will decrease 04/28/2019 0904 by Winnifred Friar, RN Outcome:  Progressing 04/28/2019 0904 by Winnifred Friar, RN Outcome: Progressing 04/28/2019 0904 by Winnifred Friar, RN Outcome: Progressing   Problem: Elimination: Goal: Will not experience complications related to bowel motility 04/28/2019 0904 by Winnifred Friar, RN Outcome: Progressing 04/28/2019 0904 by Winnifred Friar, RN Outcome: Progressing  04/28/2019 0904 by Winnifred Friar, RN Outcome: Progressing Goal: Will not experience complications related to urinary retention 04/28/2019 0904 by Winnifred Friar, RN Outcome: Progressing 04/28/2019 0904 by Winnifred Friar, RN Outcome: Progressing 04/28/2019 0904 by Winnifred Friar, RN Outcome: Progressing   Problem: Pain Managment: Goal: General experience of comfort will improve 04/28/2019 0904 by Winnifred Friar, RN Outcome: Progressing 04/28/2019 0904 by Winnifred Friar, RN Outcome: Progressing 04/28/2019 0904 by Winnifred Friar, RN Outcome: Progressing   Problem: Safety: Goal: Ability to remain free from injury will improve 04/28/2019 0904 by Winnifred Friar, RN Outcome: Progressing 04/28/2019 0904 by Winnifred Friar, RN Outcome: Progressing 04/28/2019 0904 by Winnifred Friar, RN Outcome: Progressing   Problem: Skin Integrity: Goal: Risk for impaired skin integrity will decrease 04/28/2019 0904 by Winnifred Friar, RN Outcome: Progressing 04/28/2019 0904 by Winnifred Friar, RN Outcome: Progressing 04/28/2019 0904 by Winnifred Friar, RN Outcome: Progressing   Problem: Education: Goal: Ability to describe self-care measures that may prevent or decrease complications (Diabetes Survival Skills Education) will improve 04/28/2019 0904 by Winnifred Friar, RN Outcome: Progressing 04/28/2019 0904 by Winnifred Friar, RN Outcome: Progressing 04/28/2019 0904 by Winnifred Friar, RN Outcome: Progressing Goal: Individualized Educational Video(s) 04/28/2019 0904 by Winnifred Friar,  RN Outcome: Progressing 04/28/2019 0904 by Winnifred Friar, RN Outcome: Progressing 04/28/2019 0904 by Winnifred Friar, RN Outcome: Progressing   Problem: Coping: Goal: Ability to adjust to condition or change in health will improve 04/28/2019 0904 by Winnifred Friar, RN Outcome: Progressing 04/28/2019 0904 by Winnifred Friar, RN Outcome: Progressing 04/28/2019 0904 by Winnifred Friar, RN Outcome: Progressing   Problem: Fluid Volume: Goal: Ability to maintain a balanced intake and output will improve 04/28/2019 0904 by Winnifred Friar, RN Outcome: Progressing 04/28/2019 0904 by Winnifred Friar, RN Outcome: Progressing 04/28/2019 0904 by Winnifred Friar, RN Outcome: Progressing   Problem: Health Behavior/Discharge Planning: Goal: Ability to identify and utilize available resources and services will improve 04/28/2019 0904 by Winnifred Friar, RN Outcome: Progressing 04/28/2019 0904 by Winnifred Friar, RN Outcome: Progressing 04/28/2019 0904 by Winnifred Friar, RN Outcome: Progressing Goal: Ability to manage health-related needs will improve 04/28/2019 0904 by Winnifred Friar, RN Outcome: Progressing 04/28/2019 0904 by Winnifred Friar, RN Outcome: Progressing 04/28/2019 0904 by Winnifred Friar, RN Outcome: Progressing   Problem: Metabolic: Goal: Ability to maintain appropriate glucose levels will improve 04/28/2019 0904 by Winnifred Friar, RN Outcome: Progressing 04/28/2019 0904 by Winnifred Friar, RN Outcome: Progressing 04/28/2019 0904 by Winnifred Friar, RN Outcome: Progressing   Problem: Nutritional: Goal: Maintenance of adequate nutrition will improve 04/28/2019 0904 by Winnifred Friar, RN Outcome: Progressing 04/28/2019 0904 by Winnifred Friar, RN Outcome: Progressing 04/28/2019 0904 by Winnifred Friar, RN Outcome: Progressing Goal: Progress toward achieving an optimal weight will improve 04/28/2019 0904 by Winnifred Friar, RN Outcome: Progressing 04/28/2019 0904 by Winnifred Friar, RN Outcome: Progressing 04/28/2019 0904 by Winnifred Friar, RN Outcome: Progressing   Problem: Skin Integrity: Goal: Risk for impaired skin integrity will decrease 04/28/2019 0904 by Winnifred Friar, RN Outcome: Progressing 04/28/2019 0904 by Winnifred Friar, RN Outcome: Progressing 04/28/2019 0904 by Winnifred Friar, RN Outcome: Progressing   Problem: Tissue Perfusion: Goal: Adequacy of tissue perfusion will improve 04/28/2019 0904 by Winnifred Friar, RN Outcome: Progressing 04/28/2019 0904 by Winnifred Friar, RN Outcome: Progressing 04/28/2019 0904 by Charlies Silvers,  Roslynn Amble, RN Outcome: Progressing

## 2019-04-29 LAB — COMPREHENSIVE METABOLIC PANEL
ALT: 23 U/L (ref 0–44)
AST: 16 U/L (ref 15–41)
Albumin: 2.3 g/dL — ABNORMAL LOW (ref 3.5–5.0)
Alkaline Phosphatase: 61 U/L (ref 38–126)
Anion gap: 8 (ref 5–15)
BUN: 27 mg/dL — ABNORMAL HIGH (ref 8–23)
CO2: 23 mmol/L (ref 22–32)
Calcium: 8.4 mg/dL — ABNORMAL LOW (ref 8.9–10.3)
Chloride: 102 mmol/L (ref 98–111)
Creatinine, Ser: 1.15 mg/dL (ref 0.61–1.24)
GFR calc Af Amer: >60 mL/min (ref >60)
GFR calc non Af Amer: >60 mL/min (ref >60)
Glucose, Bld: 320 mg/dL — ABNORMAL HIGH (ref 70–99)
Potassium: 4.8 mmol/L (ref 3.5–5.1)
Sodium: 133 mmol/L — ABNORMAL LOW (ref 135–145)
Total Bilirubin: 0.7 mg/dL (ref 0.3–1.2)
Total Protein: 5.7 g/dL — ABNORMAL LOW (ref 6.5–8.1)

## 2019-04-29 LAB — GLUCOSE, CAPILLARY
Glucose-Capillary: 293 mg/dL — ABNORMAL HIGH (ref 70–99)
Glucose-Capillary: 428 mg/dL — ABNORMAL HIGH (ref 70–99)
Glucose-Capillary: 383 mg/dL — ABNORMAL HIGH (ref 70–99)
Glucose-Capillary: 230 mg/dL — ABNORMAL HIGH (ref 70–99)

## 2019-04-29 LAB — CBC WITH DIFFERENTIAL/PLATELET
Abs Immature Granulocytes: 0.05 10*3/uL (ref 0.00–0.07)
Basophils Absolute: 0 10*3/uL (ref 0.0–0.1)
Basophils Relative: 0 %
Eosinophils Absolute: 0 10*3/uL (ref 0.0–0.5)
Eosinophils Relative: 0 %
HCT: 38.5 % — ABNORMAL LOW (ref 39.0–52.0)
Hemoglobin: 12.6 g/dL — ABNORMAL LOW (ref 13.0–17.0)
Immature Granulocytes: 1 %
Lymphocytes Relative: 3 %
Lymphs Abs: 0.3 10*3/uL — ABNORMAL LOW (ref 0.7–4.0)
MCH: 29.6 pg (ref 26.0–34.0)
MCHC: 32.7 g/dL (ref 30.0–36.0)
MCV: 90.4 fL (ref 80.0–100.0)
Monocytes Absolute: 0.4 10*3/uL (ref 0.1–1.0)
Monocytes Relative: 5 %
Neutro Abs: 8 10*3/uL — ABNORMAL HIGH (ref 1.7–7.7)
Neutrophils Relative %: 91 %
Platelets: 76 10*3/uL — ABNORMAL LOW (ref 150–400)
RBC: 4.26 MIL/uL (ref 4.22–5.81)
RDW: 13.5 % (ref 11.5–15.5)
WBC: 8.8 10*3/uL (ref 4.0–10.5)
nRBC: 0 % (ref 0.0–0.2)

## 2019-04-29 LAB — C-REACTIVE PROTEIN: CRP: 14.2 mg/dL — ABNORMAL HIGH (ref <1.0)

## 2019-04-29 LAB — FERRITIN: Ferritin: 1165 ng/mL — ABNORMAL HIGH (ref 24–336)

## 2019-04-29 LAB — D-DIMER, QUANTITATIVE: D-Dimer, Quant: 0.98 ug/mL-FEU — ABNORMAL HIGH (ref 0.00–0.50)

## 2019-04-29 MED ORDER — INSULIN ASPART 100 UNIT/ML ~~LOC~~ SOLN
0.0000 [IU] | Freq: Three times a day (TID) | SUBCUTANEOUS | Status: DC
  Administered 2019-04-29 (×2): 20 [IU] via SUBCUTANEOUS
  Administered 2019-04-30 (×2): 4 [IU] via SUBCUTANEOUS
  Administered 2019-04-30: 16:00:00 11 [IU] via SUBCUTANEOUS
  Administered 2019-05-01: 09:00:00 4 [IU] via SUBCUTANEOUS

## 2019-04-29 MED ORDER — INSULIN DETEMIR 100 UNIT/ML ~~LOC~~ SOLN
20.0000 [IU] | Freq: Two times a day (BID) | SUBCUTANEOUS | Status: DC
  Administered 2019-04-29 – 2019-05-01 (×4): 20 [IU] via SUBCUTANEOUS
  Filled 2019-04-29 (×5): qty 0.2

## 2019-04-29 MED ORDER — INSULIN DETEMIR 100 UNIT/ML ~~LOC~~ SOLN
10.0000 [IU] | Freq: Once | SUBCUTANEOUS | Status: AC
  Administered 2019-04-29: 12:00:00 10 [IU] via SUBCUTANEOUS
  Filled 2019-04-29: qty 0.1

## 2019-04-29 MED ORDER — MELATONIN 3 MG PO TABS
3.0000 mg | ORAL_TABLET | Freq: Every day | ORAL | Status: DC
  Administered 2019-04-29 – 2019-04-30 (×2): 3 mg via ORAL
  Filled 2019-04-29 (×3): qty 1

## 2019-04-29 NOTE — Progress Notes (Signed)
Occupational Therapy Evaluation Patient Details Name: Kenneth Kenley Sr. MRN: [id number] DOB: March 22, 1950 Today's Date: 04/29/2019    History of Present Illness 69 year old male admitted with sepsis due to pneumonia/ COVID; PMH uncontrolled DM, CAD, HTN, unstable anging, Barrett's esophagitis, and hemorrhage.   Clinical Impression   Patient very pleasant and eager to work with therapy.  He is independent at prior level and lives with wife at home.  He has multiple jobs and likes to stay busy.  Today patient was doing well and wanted to walk.   Patient remained on room air throughout session and SpO2 was 92-93 at rest and 87-91 with mobility.  He completed standing ADLs with supervision.  Was independent with transfers and supervision with walking.  Patient feeling more tired and weaker than normal, though, and would benefit from OT to increase strength and activity tolerance for ADLs.  Will continue to follow with OT acutely to address the deficits listed below.       Follow Up Recommendations  No OT follow up    Equipment Recommendations  None recommended by OT    Recommendations for Other Services       Precautions / Restrictions Precautions Precautions: None Restrictions Weight Bearing Restrictions: No      Mobility Bed Mobility Overal bed mobility: Independent                Transfers Overall transfer level: Independent Equipment used: None                  Balance Overall balance assessment: Independent                                         ADL either performed or assessed with clinical judgement   ADL Overall ADL's : Needs assistance/impaired Eating/Feeding: Independent;Sitting   Grooming: Wash/dry hands;Wash/dry face;Oral care;Brushing hair;Supervision/safety;Standing   Upper Body Bathing: Supervision/ safety;Standing   Lower Body Bathing: Sit to/from stand;Supervison/ safety   Upper Body Dressing : Standing;Independent    Lower Body Dressing: Sit to/from stand;Supervision/safety   Toilet Transfer: Comfort height toilet;Independent   Toileting- Clothing Manipulation and Hygiene: Supervision/safety;Sit to/from stand       Functional mobility during ADLs: Supervision/safety       Vision         Perception     Praxis      Pertinent Vitals/Pain Pain Assessment: No/denies pain     Hand Dominance Right   Extremity/Trunk Assessment Upper Extremity Assessment Upper Extremity Assessment: Generalized weakness           Communication Communication Communication: No difficulties   Cognition Arousal/Alertness: Awake/alert Behavior During Therapy: WFL for tasks assessed/performed Overall Cognitive Status: Within Functional Limits for tasks assessed                                     General Comments       Exercises Exercises: Other exercises Other Exercises Other Exercises: pursed lip breathing Other Exercises: x10 incensitve spirometer Other Exercises: x10 flutter valve   Shoulder Instructions      Home Living Family/patient expects to be discharged to:: Private residence Living Arrangements: Spouse/significant other Available Help at Discharge: Family Type of Home: House Home Access: Stairs to enter Technical brewer of Steps: 2   Home Layout: One level  Bathroom Shower/Tub: Producer, television/film/video: Standard     Home Equipment: None          Prior Functioning/Environment Level of Independence: Independent                 OT Problem List: Decreased strength;Decreased activity tolerance;Cardiopulmonary status limiting activity      OT Treatment/Interventions: Self-care/ADL training;Therapeutic exercise;Energy conservation;Therapeutic activities;Patient/family education    OT Goals(Current goals can be found in the care plan section) Acute Rehab OT Goals Patient Stated Goal: Get back to jobs OT Goal Formulation: With  patient Time For Goal Achievement: 05/13/19 Potential to Achieve Goals: Good  OT Frequency: Min 3X/week   Barriers to D/C:            Co-evaluation              AM-PAC OT "6 Clicks" Daily Activity     Outcome Measure Help from another person eating meals?: None Help from another person taking care of personal grooming?: A Little Help from another person toileting, which includes using toliet, bedpan, or urinal?: A Little Help from another person bathing (including washing, rinsing, drying)?: A Little Help from another person to put on and taking off regular upper body clothing?: None Help from another person to put on and taking off regular lower body clothing?: A Little 6 Click Score: 20   End of Session Nurse Communication: Mobility status  Activity Tolerance: Patient tolerated treatment well Patient left: in chair;with call bell/phone within reach  OT Visit Diagnosis: Muscle weakness (generalized) (M62.81)                Time: 8638-1771 OT Time Calculation (min): 33 min Charges:  OT General Charges $OT Visit: 1 Visit OT Evaluation $OT Eval Moderate Complexity: 1 Mod OT Treatments $Self Care/Home Management : 8-22 mins  Barbie Banner, OTR/L  Adella Hare 04/29/2019, 12:26 PM

## 2019-04-29 NOTE — Progress Notes (Signed)
Physical Therapy Treatment Patient Details Name: [name]. MRN: [id number] DOB: 1950-11-16 Today's Date: 04/29/2019    History of Present Illness 69 year old male admitted with sepsis due to pneumonia/ COVID; PMH uncontrolled DM, CAD, HTN, unstable anging, Barrett's esophagitis, and hemorrhage.    PT Comments    Resting in BS chair when PT arrived. Agreed to ambulation and is very motivated. He ambulated on RA , fully monitored on SPO2 throughout- 190 feet, with one brief episode of desat to 87%, recovered in < 3 minutes to 94%. Has steadily progressed in strength and endurance. Reminded to perform UE/LE HEP as provided in printed sheets. Practiced IS and Flutter Valve unit- good return demo. Should continue to benefit from PT to further address optimal functional outcomes while hospitalized  Follow Up Recommendations  No PT follow up     Equipment Recommendations       Recommendations for Other Services       Precautions / Restrictions Precautions Precautions: None Restrictions Weight Bearing Restrictions: No    Mobility  Bed Mobility Overal bed mobility: Independent                Transfers Overall transfer level: Independent Equipment used: None                Ambulation/Gait Ambulation/Gait assistance: Supervision Gait Distance (Feet): 190 Feet(One brief episode of SPO2 at 87- but quickly recovered to 94% on RA during this ambulation) Assistive device: None Gait Pattern/deviations: WFL(Within Functional Limits)         Stairs             Wheelchair Mobility    Modified Rankin (Stroke Patients Only)       Balance Overall balance assessment: Independent                                          Cognition Arousal/Alertness: Awake/alert Behavior During Therapy: WFL for tasks assessed/performed Overall Cognitive Status: Within Functional Limits for tasks assessed                                        Exercises Other Exercises Other Exercises: pursed lip breathing Other Exercises: x10 incensitve spirometer Other Exercises: x10 flutter valve Other Exercises: Reminded to do the UE/LE ex as in his HEP printed sheets.    General Comments        Pertinent Vitals/Pain Pain Assessment: No/denies pain    Home Living                      Prior Function            PT Goals (current goals can now be found in the care plan section) Acute Rehab PT Goals Patient Stated Goal: Get back to jobs PT Goal Formulation: With patient Time For Goal Achievement: 05/12/19 Potential to Achieve Goals: Good Progress towards PT goals: Progressing toward goals    Frequency    Min 4X/week      PT Plan      Co-evaluation              AM-PAC PT "6 Clicks" Mobility   Outcome Measure  Help needed turning from your back to your side while in a flat bed without using bedrails?: None Help needed moving  from lying on your back to sitting on the side of a flat bed without using bedrails?: None Help needed moving to and from a bed to a chair (including a wheelchair)?: None Help needed standing up from a chair using your arms (e.g., wheelchair or bedside chair)?: None Help needed to walk in hospital room?: A Little Help needed climbing 3-5 steps with a railing? : A Little 6 Click Score: 22    End of Session   Activity Tolerance: Patient tolerated treatment well Patient left: in chair;with call bell/phone within reach Nurse Communication: Mobility status PT Visit Diagnosis: Muscle weakness (generalized) (M62.81)     Time: 1188-6773 PT Time Calculation (min) (ACUTE ONLY): 31 min  Charges:  $Gait Training: 8-22 mins $Therapeutic Exercise: 8-22 mins                   Danika Kluender P, PT # [id number] CGV cell   Ephriam Jenkins 04/29/2019, 4:11 PM

## 2019-04-29 NOTE — Progress Notes (Signed)
Pharmacy Antibiotic Note  Kenneth Edelson Sr. is a 69 y.o. male recently admitted for COVID-19 PNA 2/10-2/14 re-admitted on 04/25/2019 with pneumonia.  Pharmacy was consulted for antibiotic dosing.  Creatinine has remained stable, CRP = 14.2, WBC wnl, and he is afebrile.  He received: Vancomycin 1750mg  x 1 then 1250mg  IV x 1 Azithromycin 500mg  IV x 2 then 500mg  PO x 2 (Last dose scheduled 2/26) Zosyn 3.375gm IV - started 2/22 with planned duration of 5-7 days (no stop entered yet)   Plan: Continue Zosyn 3.375 g q 8 h - F/U LOT with MD. F/U culture results, MRSA PCR F/U renal function, levels if indicated  Height: 5\' 8"  (172.7 cm) Weight: 173 lb (78.5 kg) IBW/kg (Calculated) : 68.4  Temp (24hrs), Avg:97.4 F (36.3 C), Min:97.3 F (36.3 C), Max:97.5 F (36.4 C)  Recent Labs  Lab 04/25/19 2249 04/26/19 0416 04/26/19 0530 04/27/19 0237 04/28/19 0332 04/29/19 0134  WBC 15.4*  --  13.0* 8.8 5.3 8.8  CREATININE 1.35*  --  1.28* 1.17 1.07 1.15  LATICACIDVEN 2.6* 2.5* 1.8  --   --   --     Estimated Creatinine Clearance: 58.7 mL/min (by C-G formula based on SCr of 1.15 mg/dL).    Allergies  Allergen Reactions  . Lyrica [Pregabalin] Other (See Comments)    Made patient very lethargic the next morning, very hard to patient to function, move, etc.   . Statins Other (See Comments)    Causes Restless Legs, rhabdomyolysis  . Metformin And Related     GI upset  . Trulicity [Dulaglutide] Other (See Comments)    GI side effects     Antimicrobials this admission: 2/22 CTX  X 1 2/22 azithromycin >> 2/22 vancomycin >>  2/22 Zosyn >>   Dose adjustments this admission:  Microbiology results: 2/22 BCx: ngtd UCx:   Sputum:   MRSA PCR:   Thank you for allowing pharmacy to be a part of this patient's care.  3/22 [facility] 04/29/2019 8:44 AM

## 2019-04-29 NOTE — Plan of Care (Signed)
  Problem: Education: Goal: Knowledge of risk factors and measures for prevention of condition will improve Outcome: Progressing   Problem: Coping: Goal: Psychosocial and spiritual needs will be supported Outcome: Progressing   Problem: Respiratory: Goal: Will maintain a patent airway Outcome: Progressing Goal: Complications related to the disease process, condition or treatment will be avoided or minimized Outcome: Progressing   Problem: Education: Goal: Knowledge of General Education information will improve Description: Including pain rating scale, medication(s)/side effects and non-pharmacologic comfort measures Outcome: Progressing   Problem: Health Behavior/Discharge Planning: Goal: Ability to manage health-related needs will improve Outcome: Progressing   Problem: Clinical Measurements: Goal: Ability to maintain clinical measurements within normal limits will improve Outcome: Progressing Goal: Will remain free from infection Outcome: Progressing Goal: Diagnostic test results will improve Outcome: Progressing Goal: Respiratory complications will improve Outcome: Progressing Goal: Cardiovascular complication will be avoided Outcome: Progressing   Problem: Activity: Goal: Risk for activity intolerance will decrease Outcome: Progressing   Problem: Nutrition: Goal: Adequate nutrition will be maintained Outcome: Progressing   Problem: Coping: Goal: Level of anxiety will decrease Outcome: Progressing   Problem: Elimination: Goal: Will not experience complications related to bowel motility Outcome: Progressing Goal: Will not experience complications related to urinary retention Outcome: Progressing   Problem: Pain Managment: Goal: General experience of comfort will improve Outcome: Progressing   Problem: Safety: Goal: Ability to remain free from injury will improve Outcome: Progressing   Problem: Skin Integrity: Goal: Risk for impaired skin integrity will  decrease Outcome: Progressing   Problem: Education: Goal: Ability to describe self-care measures that may prevent or decrease complications (Diabetes Survival Skills Education) will improve Outcome: Progressing Goal: Individualized Educational Video(s) Outcome: Progressing   Problem: Coping: Goal: Ability to adjust to condition or change in health will improve Outcome: Progressing   Problem: Fluid Volume: Goal: Ability to maintain a balanced intake and output will improve Outcome: Progressing   Problem: Health Behavior/Discharge Planning: Goal: Ability to identify and utilize available resources and services will improve Outcome: Progressing Goal: Ability to manage health-related needs will improve Outcome: Progressing   Problem: Metabolic: Goal: Ability to maintain appropriate glucose levels will improve Outcome: Progressing   Problem: Nutritional: Goal: Maintenance of adequate nutrition will improve Outcome: Progressing Goal: Progress toward achieving an optimal weight will improve Outcome: Progressing   Problem: Skin Integrity: Goal: Risk for impaired skin integrity will decrease Outcome: Progressing   Problem: Tissue Perfusion: Goal: Adequacy of tissue perfusion will improve Outcome: Progressing   

## 2019-04-29 NOTE — Progress Notes (Addendum)
PROGRESS NOTE    Kenneth Lefevers Sr.  CZY:[id number] DOB: October 07, 1950 DOA: 04/25/2019 PCP: Kermit Balo, DO   Brief Narrative:  69 year old WM PMHx DM type II uncontrolled with complication , HLD, CAD, HTN, unstable angina Barrett's esophagitis, cervicalgia, restless leg syndrome, iron deficiency anemia, unspecified gastritis and gastroduodenitis with hemorrhage recent COVID-19 pneumonia diagnosed on 04/09/2019 now presenting to the emergency department for evaluation of generalized weakness, fatigue, and near syncope.  Patient initially tested positive for COVID-19 on 04/09/2019 after a few days of symptoms, received treatment with antibodies as an outpatient, continued to worsen despite this, developed hypoxia, and was admitted on 04/14/2019.  He was treated with remdesivir and Decadron during the admission and discharged 1 week ago on room air.  He reports never improving much since the initial diagnosis and has begun to worsen again over the past couple days.  He was seen in the emergency department yesterday with chest pain that was said to be reproducible and evaluated with normal high-sensitivity troponins and a CTA chest is negative for PE.  Back at home, he continued to worsen with generalized weakness, fatigue, and then had a near syncopal episode.  EMS was called out to his house where he was found to be hypotensive with blood pressure 60/40, saturating in the upper 80s on room air, was given 500 cc of saline, supplemental oxygen, and brought back into the ED.  He has not had any chest pain since last night, denies any lower extremity swelling or tenderness, continues to have a nonproductive cough, and has never regained an appetite since his initial diagnosis.  He has also developed some loose stools over the past few days with no melena or hematochezia.  Denies abdominal pain or vomiting.  He has continued to take his blood pressure medications at home but did not use his insulin today due to loss of  appetite and general malaise. Upon arrival to the ED, patient is found to be afebrile, saturating 91% on room air, tachypneic close to 30, and with blood pressure 78/44.  EKG features a sinus rhythm with PAC.  Chest x-ray demonstrates bilateral consolidations concerning for pneumonia that appears to have worsened from yesterday.  Chemistry panel is notable for creatinine 1.35, up from an apparent baseline of roughly 1.1.  CBC is notable for an increased leukocytosis now 15,400, and a worsening thrombocytopenia with platelets now 71,000.  Lactic acid is elevated to 2.6.  Blood and urine cultures were ordered in the emergency department, patient was given 2 L of lactated Ringer's, Rocephin, and azithromycin.  Subjective: No acute issues or events overnight, clinically feels improved, ambulating around the room this morning with minimal dyspnea and fatigue. Denies headache, fever, chills, nausea, vomiting, diarrhea or constipation.  Assessment & Plan:   Principal Problem:   Sepsis due to pneumonia [facility]) Active Problems:   Gastritis and gastroduodenitis with hemorrhage   Essential hypertension   Unstable angina (HCC)   CAD (coronary artery disease), native coronary artery   Diabetes mellitus type 2, uncontrolled (HCC)   Mild renal insufficiency   Hyponatremia   Thrombocytopenia (HCC)   Gastritis with hemorrhage   Diabetes mellitus type 2, uncontrolled, with complications (HCC)   HLD (hyperlipidemia)   Restless leg syndrome   Chronic iron deficiency anemia   Barrett's esophagus with esophagitis   Pneumonia due to COVID-19 virus   HCAP (healthcare-associated pneumonia)   Severe sepsis (HCC)   Severe sepsis with septic shock (HCC)   Acute lower UTI  Covid pneumonia/acute respiratory failure with hypoxia Failure of outpatient antibiotics Recent Labs    04/27/19 0337 04/27/19 0337 04/27/19 1430 04/28/19 0332 04/29/19 0134  DDIMER 1.17*   < > 1.10* 1.23* 0.98*  FERRITIN 641*  --   --   927* 1,165*  CRP 23.7*  --   --  23.6* 14.2*   < > = values in this interval not displayed.  - 2/14 CRP 3.8 @ discharge Lab Results  Component Value Date   SARSCOV2NAA POSITIVE (A) 04/14/2019   SARSCOV2NAA Not Detected 01/22/2019  - Failure of outpatient antibiotics (?Azithromycin per report), previously completed Remdesivir and Decadron. Previously discharged on room air 04/18/19 - Continue Zosyn x 5 days minimum (through 2/26) pending clinical status one could justify full 7 day course (through 2/28) as below - Patient with increased WOB/S OB.  Increasing CRP - Decadron 6 mg daily - Remdesivir 100mg  x5 days - Combivent, vitamin C, flutter valve, incentive spirometer as tolerated - MRSA swab negative - hold vancomycin - Titrate O2 to maintain SPO2> 88% - If possible prone patient 16 hours/day; if patient cannot tolerate prone 2 to 3 hours per shift  HCAP/Sepsis POA - Trend procalcitonin - minimally elevated at admission in the setting of outpatient abx - Complete 5 to 7 day (stop 26th vs 28th) course antibiotics for HCAP/post viral bacterial pna - Patient does not meet criteria for shock given no need for pressors  Urine culture positive staph Epidermidis/Ecoli, UTI POA - Likely playing role in sepsis criteria as above - Both organisms covered by zosyn as above  AKI (Baseline Cr 0.94) resolved Recent Labs  Lab 04/25/19 2249 04/26/19 0530 04/27/19 0237 04/28/19 0332 04/29/19 0134  CREATININE 1.35* 1.28* 1.17 1.07 1.15  - Hydration normal saline 100 ml/hr  DM type II uncontrolled with complication given AKI - 1/5 Hemoglobin A1c= 9.2 - Levemir increased to 20 units twice daily - NovoLog 2 units qac per diabetic coordinator - Continue resistant SSI  Essential HTN - Patient relatively hypotensive holding BP meds - Resume once indicated  CAD - S/P PCI - Currently chest pain-free - ASA 81 mg daily - Statin allergy  Thrombocytopenia, stable - Platelets stabilizing with  treatment of infection as above - Currently no evidence of acute bleed - DIC panel:     - protime/INR - WNL     - PTT - WNL     - Fibrinogen - elevated (although in the setting of recent covid)     - D-dimer - minimally elevated 1.2 (again in the setting of covid)  GERD with Barrett's esophagus - Continue PPI.  Restless leg syndrome - Pramipexole 1 mg@1830 /2 mg@qhs   Hyponatremia - See AKI  DVT prophylaxis: SCD secondary thrombocytopenia Code Status: Full Family Communication: Pam (wife) updated on plan of care, answered all questions Disposition Plan: TBD - pending clinical course, resolution of hypoxia, sepsis criteria and physical therapy.  Procedures/Significant Events:  2/21 CTA chest PE protocol;-negative PE. -Multifocal linear, ground-glass, and consolidative opacities throughout both lungs. Findings are consistent with patient's history of COVID-19 pneumonia. Parenchymal involvement is moderate. -Trace right pleural effusion.  I have personally reviewed and interpreted all radiology studies and my findings are as above.  Cultures 2/22 urine positive staph Epidermidis 2/22 blood NGTD 2/22 blood LEFT AC NGTD 2/23 MRSA by PCR negative 2/23 acute hepatitis panel pending 2/23 sputum pending  Antimicrobials: Anti-infectives (From admission, onward)   Start     Dose/Rate Route Frequency Ordered Stop   04/28/19 1000  azithromycin [facility]) tablet 500 mg     500 mg Oral Daily 04/27/19 1116 05/01/19 0959   04/27/19 1500  remdesivir 100 mg in sodium chloride 0.9 % 100 mL IVPB     100 mg 200 mL/hr over 30 Minutes Intravenous Daily 04/27/19 1338 05/02/19 0959   04/27/19 1000  vancomycin (VANCOREADY) IVPB 1250 mg/250 mL  Status:  Discontinued     1,250 mg 166.7 mL/hr over 90 Minutes Intravenous Every 24 hours 04/26/19 0855 04/27/19 1115   04/27/19 0115  azithromycin (ZITHROMAX) 500 mg in sodium chloride 0.9 % 250 mL IVPB  Status:  Discontinued     500 mg 250 mL/hr over  60 Minutes Intravenous Every 24 hours 04/26/19 0531 04/27/19 1116   04/26/19 2200  cefTRIAXone (ROCEPHIN) 2 g in sodium chloride 0.9 % 100 mL IVPB  Status:  Discontinued     2 g 200 mL/hr over 30 Minutes Intravenous Every 24 hours 04/26/19 0531 04/26/19 0742   04/26/19 0900  vancomycin (VANCOREADY) IVPB 1750 mg/350 mL     1,750 mg 175 mL/hr over 120 Minutes Intravenous  Once 04/26/19 0819 04/26/19 1723   04/26/19 0900  piperacillin-tazobactam (ZOSYN) IVPB 3.375 g     3.375 g 12.5 mL/hr over 240 Minutes Intravenous Every 8 hours 04/26/19 0819     04/26/19 0115  cefTRIAXone (ROCEPHIN) 1 g in sodium chloride 0.9 % 100 mL IVPB     1 g 200 mL/hr over 30 Minutes Intravenous  Once 04/26/19 0110 04/26/19 0208   04/26/19 0115  azithromycin (ZITHROMAX) 500 mg in sodium chloride 0.9 % 250 mL IVPB     500 mg 250 mL/hr over 60 Minutes Intravenous  Once 04/26/19 0110 04/26/19 0331     Continuous Infusions: . sodium chloride    . sodium chloride 100 mL/hr at 04/27/19 1849  . piperacillin-tazobactam (ZOSYN)  IV 3.375 g (04/29/19 0408)  . remdesivir 100 mg in NS 100 mL Stopped (04/28/19 0850)    Objective: Vitals:   04/28/19 1639 04/28/19 1946 04/28/19 2000 04/29/19 0500  BP: 126/66 108/65    Pulse:      Resp:  18  18  Temp:  (!) 97.3 F (36.3 C) (!) 97.3 F (36.3 C)   TempSrc:  Oral    SpO2:      Weight:      Height:        Intake/Output Summary (Last 24 hours) at 04/29/2019 0757 Last data filed at 04/29/2019 0400 Gross per 24 hour  Intake 800 ml  Output 780 ml  Net 20 ml   Filed Weights   04/25/19 2246 04/26/19 0114  Weight: 78.5 kg 78.5 kg    Examination:  General: A/O x4, positive Acute respiratory distress Eyes: negative scleral hemorrhage, negative anisocoria, negative icterus ENT: Negative Runny nose, negative gingival bleeding, Neck:  Negative scars, masses, torticollis, lymphadenopathy, JVD Lungs:, Tachypneic decreased breath sounds bilaterally without wheezes or  crackles Cardiovascular: Regular rate and rhythm without murmur gallop or rub normal S1 and S2 Abdomen: negative abdominal pain, nondistended, positive soft, bowel sounds, no rebound, no ascites, no appreciable mass Extremities: No significant cyanosis, clubbing, or edema bilateral lower extremities Skin: Negative rashes, lesions, ulcers Central nervous system:  Cranial nerves II through XII intact, tongue/uvula midline, all extremities muscle strength 5/5, sensation intact throughout, negative dysarthria, negative expressive aphasia, negative receptive aphasia.  Data Reviewed: Care during the described time interval was provided by me .  I have reviewed this patient's available data, including medical history, events  of note, physical examination, and all test results as part of my evaluation.  CBC: Recent Labs  Lab 04/25/19 2249 04/25/19 2249 04/26/19 0530 04/27/19 0237 04/27/19 1430 04/28/19 0332 04/29/19 0134  WBC 15.4*  --  13.0* 8.8  --  5.3 8.8  NEUTROABS 14.1*  --  11.9* 7.8*  --  4.7 8.0*  HGB 13.4  --  13.8 13.4  --  13.1 12.6*  HCT 39.6  --  41.3 40.3  --  40.1 38.5*  MCV 89.4  --  90.6 91.0  --  91.3 90.4  PLT 71*   < > 62* 56* 54* 65* 76*   < > = values in this interval not displayed.   Basic Metabolic Panel: Recent Labs  Lab 04/25/19 2249 04/26/19 0530 04/27/19 0237 04/27/19 0337 04/28/19 0332 04/29/19 0134  NA 132* 135 130*  --  136 133*  K 3.8 4.0 3.8  --  4.6 4.8  CL 99 100 97*  --  102 102  CO2 22 28 25   --  24 23  GLUCOSE 158* 84 131*  --  151* 320*  BUN 24* 21 21  --  22 27*  CREATININE 1.35* 1.28* 1.17  --  1.07 1.15  CALCIUM 7.8* 7.9* 7.8*  --  8.2* 8.4*  MG  --  1.9  --  1.7 2.1  --   PHOS  --  3.2  --  2.4* 4.1  --    GFR: Estimated Creatinine Clearance: 58.7 mL/min (by C-G formula based on SCr of 1.15 mg/dL).  Liver Function Tests: Recent Labs  Lab 04/25/19 2249 04/26/19 0530 04/27/19 0237 04/28/19 0332 04/29/19 0134  AST 14* 13* 13*  18 16  ALT 19 19 17 21 23   ALKPHOS 42 48 55 64 61  BILITOT 1.1 0.9 1.1 0.8 0.7  PROT 4.8* 5.3* 5.4* 5.9* 5.7*  ALBUMIN 2.1* 2.5* 2.3* 2.4* 2.3*   Coagulation Profile: Recent Labs  Lab 04/26/19 0530 04/27/19 1430  INR 1.2 1.1   CBG: Recent Labs  Lab 04/27/19 2010 04/28/19 0739 04/28/19 1132 04/28/19 1635 04/28/19 1956  GLUCAP 159* 162* 311* 271* 392*   Anemia Panel: Recent Labs    04/28/19 0332 04/29/19 0134  FERRITIN 927* 1,165*   Urine analysis:    Component Value Date/Time   COLORURINE YELLOW 04/26/2019 0112   APPEARANCEUR CLEAR 04/26/2019 0112   LABSPEC <1.005 (L) 04/26/2019 0112   PHURINE 6.0 04/26/2019 0112   GLUCOSEU NEGATIVE 04/26/2019 0112   HGBUR NEGATIVE 04/26/2019 0112   BILIRUBINUR NEGATIVE 04/26/2019 0112   KETONESUR NEGATIVE 04/26/2019 0112   PROTEINUR NEGATIVE 04/26/2019 0112   UROBILINOGEN 0.2 09/21/2013 0925   NITRITE NEGATIVE 04/26/2019 0112   LEUKOCYTESUR NEGATIVE 04/26/2019 0112    Recent Results (from the past 240 hour(s))  Urine culture     Status: Abnormal   Collection Time: 04/26/19 12:35 AM   Specimen: In/Out Cath Urine  Result Value Ref Range Status   Specimen Description   Final    IN/OUT CATH URINE Performed at [facility], [address], [place], [address] [place]    Special Requests ADDED [phone] 04/26/2019  Final   Culture (A)  Final    30,000 COLONIES/mL STAPHYLOCOCCUS EPIDERMIDIS 10,000 COLONIES/mL ESCHERICHIA COLI    Report Status 04/28/2019 FINAL  Final   Organism ID, Bacteria STAPHYLOCOCCUS EPIDERMIDIS (A)  Final   Organism ID, Bacteria ESCHERICHIA COLI (A)  Final      Susceptibility   Escherichia coli - MIC*  AMPICILLIN 8 SENSITIVE Sensitive     CEFAZOLIN <=4 SENSITIVE Sensitive     CEFTRIAXONE <=0.25 SENSITIVE Sensitive     CIPROFLOXACIN <=0.25 SENSITIVE Sensitive     GENTAMICIN <=1 SENSITIVE Sensitive     IMIPENEM <=0.25 SENSITIVE Sensitive     NITROFURANTOIN 32 SENSITIVE Sensitive     TRIMETH/SULFA  <=20 SENSITIVE Sensitive     AMPICILLIN/SULBACTAM 4 SENSITIVE Sensitive     PIP/TAZO <=4 SENSITIVE Sensitive     * 10,000 COLONIES/mL ESCHERICHIA COLI   Staphylococcus epidermidis - MIC*    CIPROFLOXACIN 1 SENSITIVE Sensitive     GENTAMICIN <=0.5 SENSITIVE Sensitive     NITROFURANTOIN <=16 SENSITIVE Sensitive     OXACILLIN <=0.25 SENSITIVE Sensitive     TETRACYCLINE >=16 RESISTANT Resistant     VANCOMYCIN 2 SENSITIVE Sensitive     TRIMETH/SULFA <=10 SENSITIVE Sensitive     CLINDAMYCIN RESISTANT Resistant     RIFAMPIN <=0.5 SENSITIVE Sensitive     Inducible Clindamycin POSITIVE Resistant     * 30,000 COLONIES/mL STAPHYLOCOCCUS EPIDERMIDIS  Blood Culture (routine x 2)     Status: None (Preliminary result)   Collection Time: 04/26/19  1:30 AM   Specimen: BLOOD  Result Value Ref Range Status   Specimen Description BLOOD SITE NOT SPECIFIED  Final   Special Requests   Final    BOTTLES DRAWN AEROBIC AND ANAEROBIC Blood Culture adequate volume   Culture   Final    NO GROWTH 2 DAYS Performed at [facility], [address] [place], [place] [address]    Report Status PENDING  Incomplete  Blood Culture (routine x 2)     Status: None (Preliminary result)   Collection Time: 04/26/19  1:40 AM   Specimen: BLOOD  Result Value Ref Range Status   Specimen Description BLOOD LEFT ANTECUBITAL  Final   Special Requests   Final    BOTTLES DRAWN AEROBIC AND ANAEROBIC Blood Culture adequate volume   Culture   Final    NO GROWTH 2 DAYS Performed at [facility], [place] [address] [place], [place] [address]    Report Status PENDING  Incomplete  MRSA PCR Screening     Status: None   Collection Time: 04/26/19  9:17 AM   Specimen: Nasal Mucosa; Nasopharyngeal  Result Value Ref Range Status   MRSA by PCR NEGATIVE NEGATIVE Final    Comment:        The GeneXpert MRSA Assay (FDA approved for NASAL specimens only), is one component of a comprehensive MRSA colonization surveillance program.  It is not intended to diagnose MRSA infection nor to guide or monitor treatment for MRSA infections. Performed at [facility], [place] [address], [place], [place] [address]      Radiology Studies: No results found.  Scheduled Meds: . ascorbic acid  500 mg Oral BID  . aspirin  81 mg Oral Daily  . azithromycin  500 mg Oral Daily  . cholecalciferol  1,000 Units Oral Daily  . dexamethasone (DECADRON) injection  6 mg Intravenous Q24H  . famotidine  20 mg Oral BID  . gabapentin  200 mg Oral QPC supper  . insulin aspart  0-5 Units Subcutaneous QHS  . insulin aspart  0-9 Units Subcutaneous TID WC  . insulin aspart  2 Units Subcutaneous TID WC  . insulin detemir  8 Units Subcutaneous Daily  . Ipratropium-Albuterol  1 puff Inhalation Q6H  . pantoprazole  40 mg Oral Daily  . pramipexole  1 mg  Oral QPM  . pramipexole  2 mg Oral QHS  . sodium chloride flush  3 mL Intravenous Q12H  . zinc sulfate  220 mg Oral Daily   Continuous Infusions: . sodium chloride    . sodium chloride 100 mL/hr at 04/27/19 1849  . piperacillin-tazobactam (ZOSYN)  IV 3.375 g (04/29/19 0408)  . remdesivir 100 mg in NS 100 mL Stopped (04/28/19 0850)     LOS: 3 days   Time spent: 40 minutes  Azucena FallenWilliam C Millisa Giarrusso, DO  [facility] If 7PM-7AM, please contact night-coverage [email or web address]  04/29/2019, 7:57 AM

## 2019-04-29 NOTE — Progress Notes (Signed)
Inpatient Diabetes Program Recommendations  AACE/ADA: New Consensus Statement on Inpatient Glycemic Control (2015)  Target Ranges:  Prepandial:   less than 140 mg/dL      Peak postprandial:   less than 180 mg/dL (1-2 hours)      Critically ill patients:  140 - 180 mg/dL   Lab Results  Component Value Date   GLUCAP 293 (H) 04/29/2019   HGBA1C 9.2 (H) 03/09/2019    Review of Glycemic Control  Diabetes history: Type 2 Outpatient Diabetes medications: Basalglar 40 units every HS, Metformin 1000 mg BID Current orders for Inpatient glycemic control: Levemir 8 units at HS, Novolog RESISTANT correction scale TID & HS scale, Novolog 2 units TID  Inpatient Diabetes Program Recommendations:   Noted that blood sugars continue to be greater than 180 mg/dl. Recommend increasing Levemir to 20 units daily or Levemir 10 units BID (1/2 of home dose of Basalglar) if blood sugars continue to be elevated. Titrate dosages as needed.  Will continue to monitor blood sugars while in the hospital.  Smith Mince RN BSN CDE Diabetes Coordinator Pager: [phone]  8am-5pm

## 2019-04-29 NOTE — Plan of Care (Signed)
Problem: Education: Goal: Knowledge of risk factors and measures for prevention of condition will improve 04/29/2019 1050 by Orvan Falconer, RN Outcome: Progressing 04/29/2019 1050 by Orvan Falconer, RN Outcome: Progressing   Problem: Coping: Goal: Psychosocial and spiritual needs will be supported 04/29/2019 1050 by Orvan Falconer, RN Outcome: Progressing 04/29/2019 1050 by Orvan Falconer, RN Outcome: Progressing   Problem: Respiratory: Goal: Will maintain a patent airway 04/29/2019 1050 by Orvan Falconer, RN Outcome: Progressing 04/29/2019 1050 by Orvan Falconer, RN Outcome: Progressing Goal: Complications related to the disease process, condition or treatment will be avoided or minimized 04/29/2019 1050 by Orvan Falconer, RN Outcome: Progressing 04/29/2019 1050 by Orvan Falconer, RN Outcome: Progressing   Problem: Education: Goal: Knowledge of General Education information will improve Description: Including pain rating scale, medication(s)/side effects and non-pharmacologic comfort measures 04/29/2019 1050 by Orvan Falconer, RN Outcome: Progressing 04/29/2019 1050 by Orvan Falconer, RN Outcome: Progressing   Problem: Health Behavior/Discharge Planning: Goal: Ability to manage health-related needs will improve 04/29/2019 1050 by Orvan Falconer, RN Outcome: Progressing 04/29/2019 1050 by Orvan Falconer, RN Outcome: Progressing   Problem: Clinical Measurements: Goal: Ability to maintain clinical measurements within normal limits will improve 04/29/2019 1050 by Orvan Falconer, RN Outcome: Progressing 04/29/2019 1050 by Orvan Falconer, RN Outcome: Progressing Goal: Will remain free from infection 04/29/2019 1050 by Orvan Falconer, RN Outcome: Progressing 04/29/2019 1050 by Orvan Falconer, RN Outcome: Progressing Goal: Diagnostic test results will improve 04/29/2019 1050 by Orvan Falconer, RN Outcome:  Progressing 04/29/2019 1050 by Orvan Falconer, RN Outcome: Progressing Goal: Respiratory complications will improve 04/29/2019 1050 by Orvan Falconer, RN Outcome: Progressing 04/29/2019 1050 by Orvan Falconer, RN Outcome: Progressing Goal: Cardiovascular complication will be avoided 04/29/2019 1050 by Orvan Falconer, RN Outcome: Progressing 04/29/2019 1050 by Orvan Falconer, RN Outcome: Progressing   Problem: Activity: Goal: Risk for activity intolerance will decrease 04/29/2019 1050 by Orvan Falconer, RN Outcome: Progressing 04/29/2019 1050 by Orvan Falconer, RN Outcome: Progressing   Problem: Nutrition: Goal: Adequate nutrition will be maintained 04/29/2019 1050 by Orvan Falconer, RN Outcome: Progressing 04/29/2019 1050 by Orvan Falconer, RN Outcome: Progressing   Problem: Coping: Goal: Level of anxiety will decrease 04/29/2019 1050 by Orvan Falconer, RN Outcome: Progressing 04/29/2019 1050 by Orvan Falconer, RN Outcome: Progressing   Problem: Elimination: Goal: Will not experience complications related to bowel motility 04/29/2019 1050 by Orvan Falconer, RN Outcome: Progressing 04/29/2019 1050 by Orvan Falconer, RN Outcome: Progressing Goal: Will not experience complications related to urinary retention 04/29/2019 1050 by Orvan Falconer, RN Outcome: Progressing 04/29/2019 1050 by Orvan Falconer, RN Outcome: Progressing   Problem: Pain Managment: Goal: General experience of comfort will improve 04/29/2019 1050 by Orvan Falconer, RN Outcome: Progressing 04/29/2019 1050 by Orvan Falconer, RN Outcome: Progressing   Problem: Safety: Goal: Ability to remain free from injury will improve 04/29/2019 1050 by Orvan Falconer, RN Outcome: Progressing 04/29/2019 1050 by Orvan Falconer, RN Outcome: Progressing   Problem: Skin Integrity: Goal: Risk for impaired skin integrity will decrease 04/29/2019 1050 by  Orvan Falconer, RN Outcome: Progressing 04/29/2019 1050 by Orvan Falconer, RN Outcome: Progressing   Problem: Education: Goal: Ability to describe self-care measures that may prevent or decrease complications (Diabetes Survival Skills Education) will improve 04/29/2019 1050 by Orvan Falconer, RN Outcome: Progressing 04/29/2019 1050 by Orvan Falconer,  RN Outcome: Progressing Goal: Individualized Educational Video(s) 04/29/2019 1050 by Winnifred Friar, RN Outcome: Progressing 04/29/2019 1050 by Winnifred Friar, RN Outcome: Progressing   Problem: Coping: Goal: Ability to adjust to condition or change in health will improve 04/29/2019 1050 by Winnifred Friar, RN Outcome: Progressing 04/29/2019 1050 by Winnifred Friar, RN Outcome: Progressing   Problem: Fluid Volume: Goal: Ability to maintain a balanced intake and output will improve 04/29/2019 1050 by Winnifred Friar, RN Outcome: Progressing 04/29/2019 1050 by Winnifred Friar, RN Outcome: Progressing   Problem: Health Behavior/Discharge Planning: Goal: Ability to identify and utilize available resources and services will improve 04/29/2019 1050 by Winnifred Friar, RN Outcome: Progressing 04/29/2019 1050 by Winnifred Friar, RN Outcome: Progressing Goal: Ability to manage health-related needs will improve 04/29/2019 1050 by Winnifred Friar, RN Outcome: Progressing 04/29/2019 1050 by Winnifred Friar, RN Outcome: Progressing   Problem: Metabolic: Goal: Ability to maintain appropriate glucose levels will improve 04/29/2019 1050 by Winnifred Friar, RN Outcome: Progressing 04/29/2019 1050 by Winnifred Friar, RN Outcome: Progressing   Problem: Nutritional: Goal: Maintenance of adequate nutrition will improve 04/29/2019 1050 by Winnifred Friar, RN Outcome: Progressing 04/29/2019 1050 by Winnifred Friar, RN Outcome: Progressing Goal: Progress toward achieving an optimal weight will  improve 04/29/2019 1050 by Winnifred Friar, RN Outcome: Progressing 04/29/2019 1050 by Winnifred Friar, RN Outcome: Progressing   Problem: Skin Integrity: Goal: Risk for impaired skin integrity will decrease 04/29/2019 1050 by Winnifred Friar, RN Outcome: Progressing 04/29/2019 1050 by Winnifred Friar, RN Outcome: Progressing   Problem: Tissue Perfusion: Goal: Adequacy of tissue perfusion will improve 04/29/2019 1050 by Winnifred Friar, RN Outcome: Progressing 04/29/2019 1050 by Winnifred Friar, RN Outcome: Progressing

## 2019-04-30 LAB — CBC WITH DIFFERENTIAL/PLATELET
Abs Immature Granulocytes: 0.07 10*3/uL (ref 0.00–0.07)
Basophils Absolute: 0 10*3/uL (ref 0.0–0.1)
Basophils Relative: 0 %
Eosinophils Absolute: 0 10*3/uL (ref 0.0–0.5)
Eosinophils Relative: 0 %
HCT: 37.5 % — ABNORMAL LOW (ref 39.0–52.0)
Hemoglobin: 12.7 g/dL — ABNORMAL LOW (ref 13.0–17.0)
Immature Granulocytes: 1 %
Lymphocytes Relative: 4 %
Lymphs Abs: 0.3 10*3/uL — ABNORMAL LOW (ref 0.7–4.0)
MCH: 30.2 pg (ref 26.0–34.0)
MCHC: 33.9 g/dL (ref 30.0–36.0)
MCV: 89.1 fL (ref 80.0–100.0)
Monocytes Absolute: 0.3 10*3/uL (ref 0.1–1.0)
Monocytes Relative: 4 %
Neutro Abs: 7.8 10*3/uL — ABNORMAL HIGH (ref 1.7–7.7)
Neutrophils Relative %: 91 %
Platelets: 94 10*3/uL — ABNORMAL LOW (ref 150–400)
RBC: 4.21 MIL/uL — ABNORMAL LOW (ref 4.22–5.81)
RDW: 13.7 % (ref 11.5–15.5)
WBC: 8.5 10*3/uL (ref 4.0–10.5)
nRBC: 0 % (ref 0.0–0.2)

## 2019-04-30 LAB — COMPREHENSIVE METABOLIC PANEL
ALT: 21 U/L (ref 0–44)
AST: 14 U/L — ABNORMAL LOW (ref 15–41)
Albumin: 2.3 g/dL — ABNORMAL LOW (ref 3.5–5.0)
Alkaline Phosphatase: 68 U/L (ref 38–126)
Anion gap: 9 (ref 5–15)
BUN: 27 mg/dL — ABNORMAL HIGH (ref 8–23)
CO2: 24 mmol/L (ref 22–32)
Calcium: 8.2 mg/dL — ABNORMAL LOW (ref 8.9–10.3)
Chloride: 103 mmol/L (ref 98–111)
Creatinine, Ser: 0.98 mg/dL (ref 0.61–1.24)
GFR calc Af Amer: >60 mL/min (ref >60)
GFR calc non Af Amer: >60 mL/min (ref >60)
Glucose, Bld: 247 mg/dL — ABNORMAL HIGH (ref 70–99)
Potassium: 5 mmol/L (ref 3.5–5.1)
Sodium: 136 mmol/L (ref 135–145)
Total Bilirubin: 0.7 mg/dL (ref 0.3–1.2)
Total Protein: 5.7 g/dL — ABNORMAL LOW (ref 6.5–8.1)

## 2019-04-30 LAB — D-DIMER, QUANTITATIVE: D-Dimer, Quant: 0.99 ug/mL-FEU — ABNORMAL HIGH (ref 0.00–0.50)

## 2019-04-30 LAB — GLUCOSE, CAPILLARY
Glucose-Capillary: 190 mg/dL — ABNORMAL HIGH (ref 70–99)
Glucose-Capillary: 199 mg/dL — ABNORMAL HIGH (ref 70–99)
Glucose-Capillary: 276 mg/dL — ABNORMAL HIGH (ref 70–99)
Glucose-Capillary: 257 mg/dL — ABNORMAL HIGH (ref 70–99)

## 2019-04-30 LAB — FERRITIN: Ferritin: 742 ng/mL — ABNORMAL HIGH (ref 24–336)

## 2019-04-30 LAB — C-REACTIVE PROTEIN: CRP: 7.7 mg/dL — ABNORMAL HIGH (ref <1.0)

## 2019-04-30 NOTE — Discharge Planning (Signed)
Patient walked 2 laps around the west wing-took 1 short break d/t leg stiffness. He denied feeling SOB or dizzy throughout. (95-100% on RA) probe to left ear.  Steady gait.

## 2019-04-30 NOTE — Progress Notes (Signed)
PROGRESS NOTE    Kenneth Bonn Sr.  DXI:[id number] DOB: [birth year] DOA: 04/25/2019 PCP: Kermit Balo, DO   Brief Narrative:  69 year old WM PMHx DM type II uncontrolled with complication , HLD, CAD, HTN, unstable angina Barrett's esophagitis, cervicalgia, restless leg syndrome, iron deficiency anemia, unspecified gastritis and gastroduodenitis with hemorrhage recent COVID-19 pneumonia diagnosed on 04/09/2019 now presenting to the emergency department for evaluation of generalized weakness, fatigue, and near syncope.  Patient initially tested positive for COVID-19 on 04/09/2019 after a few days of symptoms, received treatment with antibodies as an outpatient, continued to worsen despite this, developed hypoxia, and was admitted on 04/14/2019.  He was treated with remdesivir and Decadron during the admission and discharged 1 week ago on room air.  He reports never improving much since the initial diagnosis and has begun to worsen again over the past couple days.  He was seen in the emergency department yesterday with chest pain that was said to be reproducible and evaluated with normal high-sensitivity troponins and a CTA chest is negative for PE.  Back at home, he continued to worsen with generalized weakness, fatigue, and then had a near syncopal episode.  EMS was called out to his house where he was found to be hypotensive with blood pressure 60/40, saturating in the upper 80s on room air, was given 500 cc of saline, supplemental oxygen, and brought back into the ED.  He has not had any chest pain since last night, denies any lower extremity swelling or tenderness, continues to have a nonproductive cough, and has never regained an appetite since his initial diagnosis.  He has also developed some loose stools over the past few days with no melena or hematochezia.  Denies abdominal pain or vomiting.  He has continued to take his blood pressure medications at home but did not use his insulin today due to loss of  appetite and general malaise. Upon arrival to the ED, patient is found to be afebrile, saturating 91% on room air, tachypneic close to 30, and with blood pressure 78/44.  EKG features a sinus rhythm with PAC.  Chest x-ray demonstrates bilateral consolidations concerning for pneumonia that appears to have worsened from yesterday.  Chemistry panel is notable for creatinine 1.35, up from an apparent baseline of roughly 1.1.  CBC is notable for an increased leukocytosis now 15,400, and a worsening thrombocytopenia with platelets now 71,000.  Lactic acid is elevated to 2.6.  Blood and urine cultures were ordered in the emergency department, patient was given 2 L of lactated Ringer's, Rocephin, and azithromycin.  Subjective: No acute issues or events overnight, clinically feels improved, ambulating around the room this morning with ongoing fatigue and weakness but moderately improved from previous. Denies headache, fever, chills, nausea, vomiting, diarrhea or constipation.    Assessment & Plan:   Principal Problem:   Sepsis due to pneumonia [facility]) Active Problems:   Gastritis and gastroduodenitis with hemorrhage   Essential hypertension   Unstable angina (HCC)   CAD (coronary artery disease), native coronary artery   Diabetes mellitus type 2, uncontrolled (HCC)   Mild renal insufficiency   Hyponatremia   Thrombocytopenia (HCC)   Gastritis with hemorrhage   Diabetes mellitus type 2, uncontrolled, with complications (HCC)   HLD (hyperlipidemia)   Restless leg syndrome   Chronic iron deficiency anemia   Barrett's esophagus with esophagitis   Pneumonia due to COVID-19 virus   HCAP (healthcare-associated pneumonia)   Severe sepsis (HCC)   Severe sepsis with septic shock (HCC)  Acute lower UTI   Covid pneumonia/acute respiratory failure with hypoxia Failure of outpatient antibiotics Recent Labs    04/28/19 0332 04/29/19 0134 04/30/19 0200  DDIMER 1.23* 0.98* 0.99*  FERRITIN 927* 1,165*  742*  CRP 23.6* 14.2* 7.7*  - 2/14 CRP 3.8 @ discharge Lab Results  Component Value Date   SARSCOV2NAA POSITIVE (A) 04/14/2019   SARSCOV2NAA Not Detected 01/22/2019  - Failure of outpatient antibiotics (?Azithromycin per report), previously completed Remdesivir and Decadron. Previously discharged on room air 04/18/19 - Continue Zosyn x 5 days (through 2/26)  - Patient now improving respiratory status, CRP downtrending appropriately - Decadron 6 mg daily - Remdesivir 100mg  x5 days through 05/01/2019 - Combivent, vitamin C, flutter valve, incentive spirometer as tolerated - MRSA swab negative - hold vancomycin -On oxygen, ambulatory oxygen screen without overt hypoxia, minimal symptoms  HCAP/Sepsis(secondary to bacterial pneumonia) POA - Completed 5 days of Zosyn on 04/30/2019  - Patient does not meet criteria for shock given no need for pressors  Urine culture positive staph Epidermidis/Ecoli, UTI POA, resolved - Likely playing role in sepsis criteria as above - Both organisms covered by zosyn as above, now completed therapy  AKI (Baseline Cr 0.94) resolved Recent Labs  Lab 04/26/19 0530 04/27/19 0237 04/28/19 0332 04/29/19 0134 04/30/19 0200  CREATININE 1.28* 1.17 1.07 1.15 0.98  -DC fluids given increased p.o. intake  DM type II uncontrolled with complication given AKI - 1/5 Hemoglobin A1c= 9.2 - Levemir increased to 20 units twice daily - NovoLog 2 units qac per diabetic coordinator - Continue resistant SSI  Essential HTN - Patient relatively hypotensive holding BP meds - Resume once indicated  CAD - S/P PCI - Currently chest pain-free - ASA 81 mg daily - Statin allergy  Thrombocytopenia, improving - Platelets stabilizing with treatment of infection as above -Continue early ambulation/SCDs as tolerated - Currently no evidence of acute bleed - DIC panel:     - protime/INR - WNL     - PTT - WNL     - Fibrinogen - elevated (although in the setting of recent  covid)     - D-dimer - minimally elevated 1.2 (again in the setting of covid)  GERD with Barrett's esophagus - Continue PPI.  Restless leg syndrome - Pramipexole 1 mg@1830 /2 mg@qhs   Hyponatremia - See AKI  DVT prophylaxis: SCD secondary thrombocytopenia Code Status: Full Family Communication: Pam (wife) updated on plan of care, answered all questions Disposition Plan: Likely disposition home pending PT evaluation, may benefit from home health/skilled therapy.  Procedures/Significant Events:  2/21 CTA chest PE protocol;-negative PE. -Multifocal linear, ground-glass, and consolidative opacities throughout both lungs. Findings are consistent with patient's history of COVID-19 pneumonia. Parenchymal involvement is moderate. -Trace right pleural effusion.  Cultures 2/22 urine positive staph Epidermidis/E coli 2/22 blood NGTD 2/22 blood LEFT AC NGTD 2/23 MRSA by PCR negative 2/23 acute hepatitis panel pending 2/23 sputum pending  Antimicrobials: Anti-infectives (From admission, onward)   Start     Dose/Rate Route Frequency Ordered Stop   04/28/19 1000  azithromycin (ZITHROMAX) tablet 500 mg     500 mg Oral Daily 04/27/19 1116 05/01/19 0959   04/27/19 1500  remdesivir 100 mg in sodium chloride 0.9 % 100 mL IVPB     100 mg 200 mL/hr over 30 Minutes Intravenous Daily 04/27/19 1338 05/02/19 0959   04/27/19 1000  vancomycin (VANCOREADY) IVPB 1250 mg/250 mL  Status:  Discontinued     1,250 mg 166.7 mL/hr over 90 Minutes Intravenous Every 24 hours  04/26/19 0855 04/27/19 1115   04/27/19 0115  azithromycin (ZITHROMAX) 500 mg in sodium chloride 0.9 % 250 mL IVPB  Status:  Discontinued     500 mg 250 mL/hr over 60 Minutes Intravenous Every 24 hours 04/26/19 0531 04/27/19 1116   04/26/19 2200  cefTRIAXone (ROCEPHIN) 2 g in sodium chloride 0.9 % 100 mL IVPB  Status:  Discontinued     2 g 200 mL/hr over 30 Minutes Intravenous Every 24 hours 04/26/19 0531 04/26/19 0742   04/26/19 0900   vancomycin (VANCOREADY) IVPB 1750 mg/350 mL     1,750 mg 175 mL/hr over 120 Minutes Intravenous  Once 04/26/19 0819 04/26/19 1723   04/26/19 0900  piperacillin-tazobactam (ZOSYN) IVPB 3.375 g     3.375 g 12.5 mL/hr over 240 Minutes Intravenous Every 8 hours 04/26/19 0819     04/26/19 0115  cefTRIAXone (ROCEPHIN) 1 g in sodium chloride 0.9 % 100 mL IVPB     1 g 200 mL/hr over 30 Minutes Intravenous  Once 04/26/19 0110 04/26/19 0208   04/26/19 0115  azithromycin (ZITHROMAX) 500 mg in sodium chloride 0.9 % 250 mL IVPB     500 mg 250 mL/hr over 60 Minutes Intravenous  Once 04/26/19 0110 04/26/19 0331     Continuous Infusions: . sodium chloride    . sodium chloride 100 mL/hr at 04/27/19 1849  . piperacillin-tazobactam (ZOSYN)  IV 3.375 g (04/30/19 0419)  . remdesivir 100 mg in NS 100 mL Stopped (04/29/19 0905)    Objective: Vitals:   04/29/19 1905 04/29/19 2115 04/30/19 0400 04/30/19 0728  BP: 120/70  124/76 129/69  Pulse:   (!) 47 (!) 56  Resp:  20 18 20   Temp: 98 F (36.7 C) 97.9 F (36.6 C) 97.8 F (36.6 C)   TempSrc: Oral Oral Oral   SpO2:    94%  Weight:      Height:        Intake/Output Summary (Last 24 hours) at 04/30/2019 0733 Last data filed at 04/30/2019 0600 Gross per 24 hour  Intake 3 ml  Output 1675 ml  Net -1672 ml   Filed Weights   04/25/19 2246 04/26/19 0114  Weight: 78.5 kg 78.5 kg    Examination:  General:  Pleasantly resting in bed, No acute distress. HEENT:  Normocephalic atraumatic.  Sclerae nonicteric, noninjected.  Extraocular movements intact bilaterally. Neck:  Without mass or deformity.  Trachea is midline. Lungs:  Clear to auscultate bilaterally without rhonchi, wheeze, or rales. Heart:  Regular rate and rhythm.  Without murmurs, rubs, or gallops. Abdomen:  Soft, nontender, nondistended.  Without guarding or rebound. Extremities: Without cyanosis, clubbing, edema, or obvious deformity. Vascular:  Dorsalis pedis and posterior tibial pulses  palpable bilaterally. Skin:  Warm and dry, no erythema, no ulcerations.   Data Reviewed: Care during the described time interval was provided by me .  I have reviewed this patient's available data, including medical history, events of note, physical examination, and all test results as part of my evaluation.  CBC: Recent Labs  Lab 04/26/19 0530 04/26/19 0530 04/27/19 0237 04/27/19 1430 04/28/19 0332 04/29/19 0134 04/30/19 0200  WBC 13.0*  --  8.8  --  5.3 8.8 8.5  NEUTROABS 11.9*  --  7.8*  --  4.7 8.0* 7.8*  HGB 13.8  --  13.4  --  13.1 12.6* 12.7*  HCT 41.3  --  40.3  --  40.1 38.5* 37.5*  MCV 90.6  --  91.0  --  91.3 90.4  89.1  PLT 62*   < > 56* 54* 65* 76* 94*   < > = values in this interval not displayed.   Basic Metabolic Panel: Recent Labs  Lab 04/26/19 0530 04/27/19 0237 04/27/19 0337 04/28/19 0332 04/29/19 0134 04/30/19 0200  NA 135 130*  --  136 133* 136  K 4.0 3.8  --  4.6 4.8 5.0  CL 100 97*  --  102 102 103  CO2 28 25  --  24 23 24   GLUCOSE 84 131*  --  151* 320* 247*  BUN 21 21  --  22 27* 27*  CREATININE 1.28* 1.17  --  1.07 1.15 0.98  CALCIUM 7.9* 7.8*  --  8.2* 8.4* 8.2*  MG 1.9  --  1.7 2.1  --   --   PHOS 3.2  --  2.4* 4.1  --   --    GFR: Estimated Creatinine Clearance: 68.8 mL/min (by C-G formula based on SCr of 0.98 mg/dL).  Liver Function Tests: Recent Labs  Lab 04/26/19 0530 04/27/19 0237 04/28/19 0332 04/29/19 0134 04/30/19 0200  AST 13* 13* 18 16 14*  ALT 19 17 21 23 21   ALKPHOS 48 55 64 61 68  BILITOT 0.9 1.1 0.8 0.7 0.7  PROT 5.3* 5.4* 5.9* 5.7* 5.7*  ALBUMIN 2.5* 2.3* 2.4* 2.3* 2.3*   Coagulation Profile: Recent Labs  Lab 04/26/19 0530 04/27/19 1430  INR 1.2 1.1   CBG: Recent Labs  Lab 04/28/19 1956 04/29/19 0730 04/29/19 1109 04/29/19 1608 04/29/19 2113  GLUCAP 392* 293* 428* 383* 230*   Anemia Panel: Recent Labs    04/29/19 0134 04/30/19 0200  FERRITIN 1,165* 742*   Urine analysis:    Component  Value Date/Time   COLORURINE YELLOW 04/26/2019 0112   APPEARANCEUR CLEAR 04/26/2019 0112   LABSPEC <1.005 (L) 04/26/2019 0112   PHURINE 6.0 04/26/2019 0112   GLUCOSEU NEGATIVE 04/26/2019 0112   HGBUR NEGATIVE 04/26/2019 0112   BILIRUBINUR NEGATIVE 04/26/2019 0112   KETONESUR NEGATIVE 04/26/2019 0112   PROTEINUR NEGATIVE 04/26/2019 0112   UROBILINOGEN 0.2 09/21/2013 0925   NITRITE NEGATIVE 04/26/2019 0112   LEUKOCYTESUR NEGATIVE 04/26/2019 0112    Recent Results (from the past 240 hour(s))  Urine culture     Status: Abnormal   Collection Time: 04/26/19 12:35 AM   Specimen: In/Out Cath Urine  Result Value Ref Range Status   Specimen Description   Final    IN/OUT CATH URINE Performed at [facility], [place] [address] [place],  [address]    Special Requests ADDED [phone] 04/26/2019  Final   Culture (A)  Final    30,000 COLONIES/mL STAPHYLOCOCCUS EPIDERMIDIS 10,000 COLONIES/mL ESCHERICHIA COLI    Report Status 04/28/2019 FINAL  Final   Organism ID, Bacteria STAPHYLOCOCCUS EPIDERMIDIS (A)  Final   Organism ID, Bacteria ESCHERICHIA COLI (A)  Final      Susceptibility   Escherichia coli - MIC*    AMPICILLIN 8 SENSITIVE Sensitive     CEFAZOLIN <=4 SENSITIVE Sensitive     CEFTRIAXONE <=0.25 SENSITIVE Sensitive     CIPROFLOXACIN <=0.25 SENSITIVE Sensitive     GENTAMICIN <=1 SENSITIVE Sensitive     IMIPENEM <=0.25 SENSITIVE Sensitive     NITROFURANTOIN 32 SENSITIVE Sensitive     TRIMETH/SULFA <=20 SENSITIVE Sensitive     AMPICILLIN/SULBACTAM 4 SENSITIVE Sensitive     PIP/TAZO <=4 SENSITIVE Sensitive     * 10,000 COLONIES/mL ESCHERICHIA COLI   Staphylococcus epidermidis - MIC*  CIPROFLOXACIN 1 SENSITIVE Sensitive     GENTAMICIN <=0.5 SENSITIVE Sensitive     NITROFURANTOIN <=16 SENSITIVE Sensitive     OXACILLIN <=0.25 SENSITIVE Sensitive     TETRACYCLINE >=16 RESISTANT Resistant     VANCOMYCIN 2 SENSITIVE Sensitive     TRIMETH/SULFA <=10 SENSITIVE Sensitive      CLINDAMYCIN RESISTANT Resistant     RIFAMPIN <=0.5 SENSITIVE Sensitive     Inducible Clindamycin POSITIVE Resistant     * 30,000 COLONIES/mL STAPHYLOCOCCUS EPIDERMIDIS  Blood Culture (routine x 2)     Status: None (Preliminary result)   Collection Time: 04/26/19  1:30 AM   Specimen: BLOOD  Result Value Ref Range Status   Specimen Description BLOOD SITE NOT SPECIFIED  Final   Special Requests   Final    BOTTLES DRAWN AEROBIC AND ANAEROBIC Blood Culture adequate volume   Culture NO GROWTH 3 DAYS  Final   Report Status PENDING  Incomplete  Blood Culture (routine x 2)     Status: None (Preliminary result)   Collection Time: 04/26/19  1:40 AM   Specimen: BLOOD  Result Value Ref Range Status   Specimen Description BLOOD LEFT ANTECUBITAL  Final   Special Requests   Final    BOTTLES DRAWN AEROBIC AND ANAEROBIC Blood Culture adequate volume   Culture NO GROWTH 3 DAYS  Final   Report Status PENDING  Incomplete  MRSA PCR Screening     Status: None   Collection Time: 04/26/19  9:17 AM   Specimen: Nasal Mucosa; Nasopharyngeal  Result Value Ref Range Status   MRSA by PCR NEGATIVE NEGATIVE Final    Comment:        The GeneXpert MRSA Assay (FDA approved for NASAL specimens only), is one component of a comprehensive MRSA colonization surveillance program. It is not intended to diagnose MRSA infection nor to guide or monitor treatment for MRSA infections. Performed at [facility], [address], [place], [place] [address]      Radiology Studies: No results found.  Scheduled Meds: . ascorbic acid  500 mg Oral BID  . aspirin  81 mg Oral Daily  . azithromycin  500 mg Oral Daily  . cholecalciferol  1,000 Units Oral Daily  . dexamethasone (DECADRON) injection  6 mg Intravenous Q24H  . famotidine  20 mg Oral BID  . gabapentin  200 mg Oral QPC supper  . insulin aspart  0-20 Units Subcutaneous TID WC  . insulin aspart  0-5 Units Subcutaneous QHS  . insulin aspart  2  Units Subcutaneous TID WC  . insulin detemir  20 Units Subcutaneous BID  . Ipratropium-Albuterol  1 puff Inhalation Q6H  . Melatonin  3 mg Oral QHS  . pantoprazole  40 mg Oral Daily  . pramipexole  1 mg Oral QPM  . pramipexole  2 mg Oral QHS  . sodium chloride flush  3 mL Intravenous Q12H  . zinc sulfate  220 mg Oral Daily   Continuous Infusions: . sodium chloride    . sodium chloride 100 mL/hr at 04/27/19 1849  . piperacillin-tazobactam (ZOSYN)  IV 3.375 g (04/30/19 0419)  . remdesivir 100 mg in NS 100 mL Stopped (04/29/19 0905)     LOS: 4 days   Time spent: 30 minutes  Azucena Fallen, DO  [facility] If 7PM-7AM, please contact night-coverage [email or web address]  04/30/2019, 7:33 AM

## 2019-04-30 NOTE — Progress Notes (Signed)
Physical Therapy Treatment Patient Details Name: [name]. MRN: [id number] DOB: 01-Apr-1950 Today's Date: 04/30/2019    History of Present Illness 69 year old male admitted with sepsis due to pneumonia/ COVID; PMH uncontrolled DM, CAD, HTN, unstable anging, Barrett's esophagitis, and hemorrhage.    PT Comments    Pt found ambulating in halls, at time of therapist arrival had completed 2 laps. Pt was agreeable to completing more and monitoring 02 sats. Pt was able to ambulate further approx 562ft with mod I on room air and sats remained in 90s throughout.       Follow Up Recommendations  No PT follow up     Equipment Recommendations  None recommended by PT    Recommendations for Other Services       Precautions / Restrictions Precautions Precautions: None Restrictions Weight Bearing Restrictions: No    Mobility  Bed Mobility Overal bed mobility: Independent                Transfers Overall transfer level: Independent                  Ambulation/Gait Ambulation/Gait assistance: Modified independent (Device/Increase time) Gait Distance (Feet): 500 Feet Assistive device: None Gait Pattern/deviations: WFL(Within Functional Limits) Gait velocity: fair   General Gait Details: pt found ambulating in hall on his own, he had completed 2 laps when therapist arrived agreeable to 2 more laps   Stairs             Wheelchair Mobility    Modified Rankin (Stroke Patients Only)       Balance Overall balance assessment: Independent                                          Cognition Arousal/Alertness: Awake/alert Behavior During Therapy: WFL for tasks assessed/performed Overall Cognitive Status: Within Functional Limits for tasks assessed                                        Exercises      General Comments        Pertinent Vitals/Pain Pain Assessment: No/denies pain    Home Living                       Prior Function            PT Goals (current goals can now be found in the care plan section) Acute Rehab PT Goals Patient Stated Goal: go home PT Goal Formulation: With patient Time For Goal Achievement: 05/12/19 Potential to Achieve Goals: Good Progress towards PT goals: Progressing toward goals    Frequency    Min 4X/week      PT Plan Current plan remains appropriate    Co-evaluation              AM-PAC PT "6 Clicks" Mobility   Outcome Measure  Help needed turning from your back to your side while in a flat bed without using bedrails?: None Help needed moving from lying on your back to sitting on the side of a flat bed without using bedrails?: None Help needed moving to and from a bed to a chair (including a wheelchair)?: None Help needed standing up from a chair using your arms (e.g., wheelchair or bedside chair)?: None  Help needed to walk in hospital room?: None Help needed climbing 3-5 steps with a railing? : A Little 6 Click Score: 23    End of Session   Activity Tolerance: Patient tolerated treatment well Patient left: Other (comment)   PT Visit Diagnosis: Muscle weakness (generalized) (M62.81)     Time: 1450-1505 PT Time Calculation (min) (ACUTE ONLY): 15 min  Charges:  $Gait Training: 8-22 mins                     Drema Pry, PT    Freddi Starr 04/30/2019, 4:24 PM

## 2019-05-01 LAB — COMPREHENSIVE METABOLIC PANEL
ALT: 25 U/L (ref 0–44)
AST: 17 U/L (ref 15–41)
Albumin: 2.5 g/dL — ABNORMAL LOW (ref 3.5–5.0)
Alkaline Phosphatase: 61 U/L (ref 38–126)
Anion gap: 11 (ref 5–15)
BUN: 26 mg/dL — ABNORMAL HIGH (ref 8–23)
CO2: 25 mmol/L (ref 22–32)
Calcium: 8.4 mg/dL — ABNORMAL LOW (ref 8.9–10.3)
Chloride: 100 mmol/L (ref 98–111)
Creatinine, Ser: 0.93 mg/dL (ref 0.61–1.24)
GFR calc Af Amer: >60 mL/min (ref >60)
GFR calc non Af Amer: >60 mL/min (ref >60)
Glucose, Bld: 201 mg/dL — ABNORMAL HIGH (ref 70–99)
Potassium: 4.9 mmol/L (ref 3.5–5.1)
Sodium: 136 mmol/L (ref 135–145)
Total Bilirubin: 0.5 mg/dL (ref 0.3–1.2)
Total Protein: 5.6 g/dL — ABNORMAL LOW (ref 6.5–8.1)

## 2019-05-01 LAB — CULTURE, BLOOD (ROUTINE X 2)
Culture: NO GROWTH
Culture: NO GROWTH
Special Requests: ADEQUATE
Special Requests: ADEQUATE

## 2019-05-01 LAB — GLUCOSE, CAPILLARY
Glucose-Capillary: 262 mg/dL — ABNORMAL HIGH (ref 70–99)
Glucose-Capillary: 194 mg/dL — ABNORMAL HIGH (ref 70–99)
Glucose-Capillary: 159 mg/dL — ABNORMAL HIGH (ref 70–99)
Glucose-Capillary: 111 mg/dL — ABNORMAL HIGH (ref 70–99)

## 2019-05-01 LAB — CBC WITH DIFFERENTIAL/PLATELET
Abs Immature Granulocytes: 0.1 10*3/uL — ABNORMAL HIGH (ref 0.00–0.07)
Basophils Absolute: 0 10*3/uL (ref 0.0–0.1)
Basophils Relative: 0 %
Eosinophils Absolute: 0 10*3/uL (ref 0.0–0.5)
Eosinophils Relative: 0 %
HCT: 37.1 % — ABNORMAL LOW (ref 39.0–52.0)
Hemoglobin: 12.5 g/dL — ABNORMAL LOW (ref 13.0–17.0)
Immature Granulocytes: 1 %
Lymphocytes Relative: 8 %
Lymphs Abs: 0.6 10*3/uL — ABNORMAL LOW (ref 0.7–4.0)
MCH: 29.9 pg (ref 26.0–34.0)
MCHC: 33.7 g/dL (ref 30.0–36.0)
MCV: 88.8 fL (ref 80.0–100.0)
Monocytes Absolute: 0.4 10*3/uL (ref 0.1–1.0)
Monocytes Relative: 5 %
Neutro Abs: 6.7 10*3/uL (ref 1.7–7.7)
Neutrophils Relative %: 86 %
Platelets: 109 10*3/uL — ABNORMAL LOW (ref 150–400)
RBC: 4.18 MIL/uL — ABNORMAL LOW (ref 4.22–5.81)
RDW: 13.5 % (ref 11.5–15.5)
WBC: 7.8 10*3/uL (ref 4.0–10.5)
nRBC: 0 % (ref 0.0–0.2)

## 2019-05-01 LAB — C-REACTIVE PROTEIN: CRP: 4.4 mg/dL — ABNORMAL HIGH (ref <1.0)

## 2019-05-01 LAB — D-DIMER, QUANTITATIVE: D-Dimer, Quant: 0.9 ug/mL-FEU — ABNORMAL HIGH (ref 0.00–0.50)

## 2019-05-01 LAB — FERRITIN: Ferritin: 468 ng/mL — ABNORMAL HIGH (ref 24–336)

## 2019-05-01 MED ORDER — PREDNISONE 10 MG PO TABS: ORAL_TABLET | ORAL | 0 refills | Status: DC

## 2019-05-01 NOTE — Plan of Care (Signed)
  Problem: Education: Goal: Knowledge of risk factors and measures for prevention of condition will improve 05/01/2019 1245 by Janan Halter, RN Outcome: Adequate for Discharge 05/01/2019 1245 by Janan Halter, RN Outcome: Progressing

## 2019-05-01 NOTE — TOC Progression Note (Signed)
Transition of Care (TOC) - Progression Note    Patient Details  Name: Kenneth Corpuz Sr. MRN: [id number] Date of Birth: [birth year]  Transition of Care [facility]) CM/SW Contact  Durenda Guthrie, RN Phone Number: [phone] (working remotely) 05/01/2019, 9:08 AM  Clinical Narrative:   Per Therapy notes patient will not need HHPT, no oxygen needs. His sats remain in the 90"s with ambulation. TOC team will monitor for needs prior to discharge.         Expected Discharge Plan and Services                                                 Social Determinants of Health (SDOH) Interventions    Readmission Risk Interventions No flowsheet data found.

## 2019-05-01 NOTE — Discharge Instructions (Signed)
COVID-19 COVID-19 is a respiratory infection that is caused by a virus called severe acute respiratory syndrome coronavirus 2 (SARS-CoV-2). The disease is also known as coronavirus disease or novel coronavirus. In some people, the virus may not cause any symptoms. In others, it may cause a serious infection. The infection can get worse quickly and can lead to complications, such as:  Pneumonia, or infection of the lungs.  Acute respiratory distress syndrome or ARDS. This is a condition in which fluid build-up in the lungs prevents the lungs from filling with air and passing oxygen into the blood.  Acute respiratory failure. This is a condition in which there is not enough oxygen passing from the lungs to the body or when carbon dioxide is not passing from the lungs out of the body.  Sepsis or septic shock. This is a serious bodily reaction to an infection.  Blood clotting problems.  Secondary infections due to bacteria or fungus.  Organ failure. This is when your body's organs stop working. The virus that causes COVID-19 is contagious. This means that it can spread from person to person through droplets from coughs and sneezes (respiratory secretions). What are the causes? This illness is caused by a virus. You may catch the virus by:  Breathing in droplets from an infected person. Droplets can be spread by a person breathing, speaking, singing, coughing, or sneezing.  Touching something, like a table or a doorknob, that was exposed to the virus (contaminated) and then touching your mouth, nose, or eyes. What increases the risk? Risk for infection You are more likely to be infected with this virus if you:  Are within 6 feet (2 meters) of a person with COVID-19.  Provide care for or live with a person who is infected with COVID-19.  Spend time in crowded indoor spaces or live in shared housing. Risk for serious illness You are more likely to become seriously ill from the virus if you:   Are 50 years of age or older. The higher your age, the more you are at risk for serious illness.  Live in a nursing home or long-term care facility.  Have cancer.  Have a long-term (chronic) disease such as: ? Chronic lung disease, including chronic obstructive pulmonary disease or asthma. ? A long-term disease that lowers your body's ability to fight infection (immunocompromised). ? Heart disease, including heart failure, a condition in which the arteries that lead to the heart become narrow or blocked (coronary artery disease), a disease which makes the heart muscle thick, weak, or stiff (cardiomyopathy). ? Diabetes. ? Chronic kidney disease. ? Sickle cell disease, a condition in which red blood cells have an abnormal "sickle" shape. ? Liver disease.  Are obese. What are the signs or symptoms? Symptoms of this condition can range from mild to severe. Symptoms may appear any time from 2 to 14 days after being exposed to the virus. They include:  A fever or chills.  A cough.  Difficulty breathing.  Headaches, body aches, or muscle aches.  Runny or stuffy (congested) nose.  A sore throat.  New loss of taste or smell. Some people may also have stomach problems, such as nausea, vomiting, or diarrhea. Other people may not have any symptoms of COVID-19. How is this diagnosed? This condition may be diagnosed based on:  Your signs and symptoms, especially if: ? You live in an area with a COVID-19 outbreak. ? You recently traveled to or from an area where the virus is common. ? You   provide care for or live with a person who was diagnosed with COVID-19. ? You were exposed to a person who was diagnosed with COVID-19.  A physical exam.  Lab tests, which may include: ? Taking a sample of fluid from the back of your nose and throat (nasopharyngeal fluid), your nose, or your throat using a swab. ? A sample of mucus from your lungs (sputum). ? Blood tests.  Imaging tests, which  may include, X-rays, CT scan, or ultrasound. How is this treated? At present, there is no medicine to treat COVID-19. Medicines that treat other diseases are being used on a trial basis to see if they are effective against COVID-19. Your health care provider will talk with you about ways to treat your symptoms. For most people, the infection is mild and can be managed at home with rest, fluids, and over-the-counter medicines. Treatment for a serious infection usually takes places in a hospital intensive care unit (ICU). It may include one or more of the following treatments. These treatments are given until your symptoms improve.  Receiving fluids and medicines through an IV.  Supplemental oxygen. Extra oxygen is given through a tube in the nose, a face mask, or a hood.  Positioning you to lie on your stomach (prone position). This makes it easier for oxygen to get into the lungs.  Continuous positive airway pressure (CPAP) or bi-level positive airway pressure (BPAP) machine. This treatment uses mild air pressure to keep the airways open. A tube that is connected to a motor delivers oxygen to the body.  Ventilator. This treatment moves air into and out of the lungs by using a tube that is placed in your windpipe.  Tracheostomy. This is a procedure to create a hole in the neck so that a breathing tube can be inserted.  Extracorporeal membrane oxygenation (ECMO). This procedure gives the lungs a chance to recover by taking over the functions of the heart and lungs. It supplies oxygen to the body and removes carbon dioxide. Follow these instructions at home: Lifestyle  If you are sick, stay home except to get medical care. Your health care provider will tell you how long to stay home. Call your health care provider before you go for medical care.  Rest at home as told by your health care provider.  Do not use any products that contain nicotine or tobacco, such as cigarettes, e-cigarettes, and  chewing tobacco. If you need help quitting, ask your health care provider.  Return to your normal activities as told by your health care provider. Ask your health care provider what activities are safe for you. General instructions  Take over-the-counter and prescription medicines only as told by your health care provider.  Drink enough fluid to keep your urine pale yellow.  Keep all follow-up visits as told by your health care provider. This is important. How is this prevented?  There is no vaccine to help prevent COVID-19 infection. However, there are steps you can take to protect yourself and others from this virus. To protect yourself:   Do not travel to areas where COVID-19 is a risk. The areas where COVID-19 is reported change often. To identify high-risk areas and travel restrictions, check the CDC travel website: wwwnc.cdc.gov/travel/notices  If you live in, or must travel to, an area where COVID-19 is a risk, take precautions to avoid infection. ? Stay away from people who are sick. ? Wash your hands often with soap and water for 20 seconds. If soap and water   are not available, use an alcohol-based hand sanitizer. ? Avoid touching your mouth, face, eyes, or nose. ? Avoid going out in public, follow guidance from your state and local health authorities. ? If you must go out in public, wear a cloth face covering or face mask. Make sure your mask covers your nose and mouth. ? Avoid crowded indoor spaces. Stay at least 6 feet (2 meters) away from others. ? Disinfect objects and surfaces that are frequently touched every day. This may include:  Counters and tables.  Doorknobs and light switches.  Sinks and faucets.  Electronics, such as phones, remote controls, keyboards, computers, and tablets. To protect others: If you have symptoms of COVID-19, take steps to prevent the virus from spreading to others.  If you think you have a COVID-19 infection, contact your health care  provider right away. Tell your health care team that you think you may have a COVID-19 infection.  Stay home. Leave your house only to seek medical care. Do not use public transport.  Do not travel while you are sick.  Wash your hands often with soap and water for 20 seconds. If soap and water are not available, use alcohol-based hand sanitizer.  Stay away from other members of your household. Let healthy household members care for children and pets, if possible. If you have to care for children or pets, wash your hands often and wear a mask. If possible, stay in your own room, separate from others. Use a different bathroom.  Make sure that all people in your household wash their hands well and often.  Cough or sneeze into a tissue or your sleeve or elbow. Do not cough or sneeze into your hand or into the air.  Wear a cloth face covering or face mask. Make sure your mask covers your nose and mouth. Where to find more information  Centers for Disease Control and Prevention: www.cdc.gov/coronavirus/2019-ncov/index.html  World Health Organization: www.who.int/health-topics/coronavirus Contact a health care provider if:  You live in or have traveled to an area where COVID-19 is a risk and you have symptoms of the infection.  You have had contact with someone who has COVID-19 and you have symptoms of the infection. Get help right away if:  You have trouble breathing.  You have pain or pressure in your chest.  You have confusion.  You have bluish lips and fingernails.  You have difficulty waking from sleep.  You have symptoms that get worse. These symptoms may represent a serious problem that is an emergency. Do not wait to see if the symptoms will go away. Get medical help right away. Call your local emergency services (911 in the [place] Do not drive yourself to the hospital. Let the emergency medical personnel know if you think you have COVID-19. Summary  COVID-19 is a  respiratory infection that is caused by a virus. It is also known as coronavirus disease or novel coronavirus. It can cause serious infections, such as pneumonia, acute respiratory distress syndrome, acute respiratory failure, or sepsis.  The virus that causes COVID-19 is contagious. This means that it can spread from person to person through droplets from breathing, speaking, singing, coughing, or sneezing.  You are more likely to develop a serious illness if you are 50 years of age or older, have a weak immune system, live in a nursing home, or have chronic disease.  There is no medicine to treat COVID-19. Your health care provider will talk with you about ways to treat your symptoms.    Take steps to protect yourself and others from infection. Wash your hands often and disinfect objects and surfaces that are frequently touched every day. Stay away from people who are sick and wear a mask if you are sick. This information is not intended to replace advice given to you by your health care provider. Make sure you discuss any questions you have with your health care provider. Document Revised: 12/18/2018 Document Reviewed: 03/26/2018 [employer]vier Patient Education  2020 [employer]  

## 2019-05-01 NOTE — Plan of Care (Signed)
  Problem: Education: Goal: Knowledge of risk factors and measures for prevention of condition will improve Outcome: Progressing   

## 2019-05-01 NOTE — Progress Notes (Signed)
Occupational Therapy Treatment/Discharge Patient Details Name: [name]. MRN: [id number] DOB: 1950/03/16 Today's Date: 05/01/2019    History of present illness 69 year old male admitted with sepsis due to pneumonia/ COVID; PMH uncontrolled DM, CAD, HTN, unstable anging, Barrett's esophagitis, and hemorrhage.   OT comments  Patient up in chair on arrival.  He was very kind and in a good mood.  He was independent with all ADLs and mobility, demonstrating improvement since prior visits.  Completed standing grooming and walked 600 ft.   Patient remained on room air throughout session and SpO2> 93.  He had no shortness of breath, but was a little tired after walk.  Discussed rest breaks when needed and energy conservation techniques to use at home.  Patient likely leaving for home today.  Will complete OT order.    Follow Up Recommendations  No OT follow up    Equipment Recommendations  None recommended by OT    Recommendations for Other Services      Precautions / Restrictions Precautions Precautions: None Restrictions Weight Bearing Restrictions: No       Mobility Bed Mobility Overal bed mobility: Independent                Transfers Overall transfer level: Independent Equipment used: None                  Balance Overall balance assessment: Independent                                         ADL either performed or assessed with clinical judgement   ADL Overall ADL's : Independent     Grooming: Wash/dry hands;Wash/dry face;Oral care;Standing                               Functional mobility during ADLs: Independent       Vision       Perception     Praxis      Cognition Arousal/Alertness: Awake/alert Behavior During Therapy: WFL for tasks assessed/performed Overall Cognitive Status: Within Functional Limits for tasks assessed                                          Exercises Exercises:  Other exercises Other Exercises Other Exercises: pursed lip breathing Other Exercises: Walk 600 ft   Shoulder Instructions       General Comments      Pertinent Vitals/ Pain       Pain Assessment: No/denies pain  Home Living                                          Prior Functioning/Environment              Frequency  Min 3X/week        Progress Toward Goals  OT Goals(current goals can now be found in the care plan section)  Progress towards OT goals: Progressing toward goals  Acute Rehab OT Goals Patient Stated Goal: go home OT Goal Formulation: With patient Time For Goal Achievement: 05/13/19 Potential to Achieve Goals: Good  Plan Discharge plan remains appropriate  Co-evaluation                 AM-PAC OT "6 Clicks" Daily Activity     Outcome Measure   Help from another person eating meals?: None Help from another person taking care of personal grooming?: None Help from another person toileting, which includes using toliet, bedpan, or urinal?: None Help from another person bathing (including washing, rinsing, drying)?: None Help from another person to put on and taking off regular upper body clothing?: None Help from another person to put on and taking off regular lower body clothing?: None 6 Click Score: 24    End of Session    OT Visit Diagnosis: Muscle weakness (generalized) (M62.81)   Activity Tolerance Patient tolerated treatment well   Patient Left in chair;with call bell/phone within reach   Nurse Communication Mobility status        Time: 1275-1700 OT Time Calculation (min): 25 min  Charges: OT General Charges $OT Visit: 1 Visit OT Treatments $Self Care/Home Management : 8-22 mins $Therapeutic Activity: 8-22 mins  August Luz, OTR/L   Phylliss Bob 05/01/2019, 12:02 PM

## 2019-05-01 NOTE — Discharge Summary (Signed)
Physician Discharge Summary  Rahsaan Weakland Sr. BJY:[id number] DOB: [birth year] DOA: 04/25/2019  PCP: Kermit Balo, DO  Admit date: 04/25/2019 Discharge date: 05/01/2019  Admitted From: Home Disposition: Home  Recommendations for Outpatient Follow-up:  1. Follow up with PCP in 1-2 weeks 2. Please obtain BMP/CBC in one week   Discharge Condition: Stable CODE STATUS: Full Diet recommendation: Diabetic diet as tolerated.  Brief/Interim Summary: 69 year old WM PMHx DM type II uncontrolled with complication , HLD, CAD, HTN, unstable angina Barrett's esophagitis, cervicalgia, restless leg syndrome, iron deficiency anemia, unspecified gastritis and gastroduodenitis with hemorrhage recent COVID-19 pneumonia diagnosed on 04/09/2019 now presenting to the emergency department for evaluation of generalized weakness, fatigue, and near syncope. Patient initially tested positive for COVID-19 on 04/09/2019 after a few days of symptoms, received treatment with antibodies as an outpatient, continued to worsen despite this, developed hypoxia, and was admitted on 04/14/2019. He was treated with remdesivir and Decadron during the admission and discharged 1 week ago on room air. He reports never improving much since the initial diagnosis and has begun to worsen again over the past couple days. He was seen in the emergency department yesterday with chest pain that was said to be reproducible and evaluated with normal high-sensitivity troponins and a CTA chest is negative for PE. Back at home, he continued to worsen with generalized weakness, fatigue, and then had a near syncopal episode. EMS was called out to his house where he was found to be hypotensive with blood pressure 60/40, saturating in the upper 80s on room air, was given 500 cc of saline, supplemental oxygen, and brought back into the ED. He has not had any chest pain since last night, denies any lower extremity swelling or tenderness, continues to have a  nonproductive cough, and has never regained an appetite since his initial diagnosis. He has also developed some loose stools over the past few days with no melena or hematochezia. Denies abdominal pain or vomiting. He has continued to take his blood pressure medications at home but did not use his insulin today due to loss of appetite and general malaise.Upon arrival to the ED, patient is found to be afebrile, saturating 91% on room air, tachypneic close to 30, and with blood pressure 78/44. EKG features a sinus rhythm with PAC. Chest x-ray demonstrates bilateral consolidations concerning for pneumonia that appears to have worsened from yesterday.   Patient admitted as above with acute hypoxic respiratory failure likely in the setting of sepsis with HCAP pneumonia, likely bacterial, secondary to post viral inflammation.  Initially there was concern the patient had not yet recovered completely from Covid 19 infection and was subsequently covered with both remdesivir, Decadron as well as broad-spectrum antibiotics.  Patient improved drastically over the past 72 to 48 hours, patient currently completed antibiotic course, antiviral course and steroids have been discontinued with ongoing improvement in symptoms.  Patient remains without hypoxia or dyspnea with ambulation.  Patient had a moderately elevated blood glucose levels in the inpatient setting in the setting of steroids which were now discontinued.  Close follow-up with PCP in the next week to further evaluate for hyperglycemia given A1c of 9.2 indicating uncontrolled diabetes mellitus type 2.  At this time patient is otherwise stable and agreeable for discharge home close follow-up with PCP in the outpatient setting.  Discharge Diagnoses:  Principal Problem:   Sepsis due to pneumonia [facility]) Active Problems:   Essential hypertension   Unstable angina (HCC)   CAD (coronary artery disease), native coronary  artery   Diabetes mellitus type 2,  uncontrolled (HCC)   Mild renal insufficiency   Hyponatremia   Thrombocytopenia (HCC)   Gastritis with hemorrhage   Diabetes mellitus type 2, uncontrolled, with complications (HCC)   HLD (hyperlipidemia)   Restless leg syndrome   Chronic iron deficiency anemia   Barrett's esophagus with esophagitis   Pneumonia due to COVID-19 virus   HCAP (healthcare-associated pneumonia)   Severe sepsis (HCC)   Acute lower UTI   Discharge Instructions  Discharge Instructions    Call MD for:  difficulty breathing, headache or visual disturbances   Complete by: As directed    Call MD for:  extreme fatigue   Complete by: As directed    Call MD for:  hives   Complete by: As directed    Call MD for:  persistant dizziness or light-headedness   Complete by: As directed    Call MD for:  persistant nausea and vomiting   Complete by: As directed    Call MD for:  severe uncontrolled pain   Complete by: As directed    Call MD for:  temperature >100.4   Complete by: As directed    Diet - low sodium heart healthy   Complete by: As directed    Increase activity slowly   Complete by: As directed    Increase activity slowly   Complete by: As directed      Allergies as of 05/01/2019      Reactions   Lyrica [pregabalin] Other (See Comments)   Made patient very lethargic the next morning, very hard to patient to function, move, etc.    Statins Other (See Comments)   Causes Restless Legs, rhabdomyolysis   Metformin And Related    GI upset   Trulicity [dulaglutide] Other (See Comments)   GI side effects       Medication List    STOP taking these medications   dexamethasone 2 MG tablet Commonly known as: DECADRON     TAKE these medications   acetaminophen 500 MG tablet Commonly known as: TYLENOL Take 500-1,000 mg by mouth every 6 (six) hours as needed (for pain/headaches.).   albuterol 108 (90 Base) MCG/ACT inhaler Commonly known as: VENTOLIN HFA Inhale 2 puffs into the lungs every 6 (six)  hours as needed for wheezing or shortness of breath.   aspirin 81 MG EC tablet Take 1 tablet (81 mg total) by mouth daily.   Basaglar KwikPen 100 UNIT/ML Sopn INJECT 40 UNITS SUBCUTANEOUSLY INTO THE SKIN AT BEDTIME What changed: See the new instructions.   BD Pen Needle Nano U/F 32G X 4 MM Misc Generic drug: Insulin Pen Needle USE TWICE DAILY WITH LANTUS PEN   benzonatate 200 MG capsule Commonly known as: TESSALON Take 200 mg by mouth 3 (three) times daily as needed for cough.   carvedilol 3.125 MG tablet Commonly known as: COREG Take 1 tablet (3.125 mg total) by mouth 2 (two) times daily with a meal.   [employer] Vitamin C 500 MG tablet Generic drug: ascorbic acid TAKE 1 TABLET BY MOUTH TWICE DAILY   D-5000 125 MCG (5000 UT) Tabs Generic drug: Cholecalciferol TAKE 1 TABLET BY MOUTH EVERY DAY What changed: how much to take   famotidine 20 MG tablet Commonly known as: PEPCID Take 1 tablet (20 mg total) by mouth 2 (two) times daily.   fluticasone 50 MCG/ACT nasal spray Commonly known as: FLONASE Place 1 spray into both nostrils daily as needed for allergies or rhinitis.  gabapentin 100 MG capsule Commonly known as: NEURONTIN Take 300-400 mg by mouth at bedtime.   LIFESCAN FINEPOINT LANCETS Misc Use to test for blood sugar three times daily dx: E11.51   metFORMIN 1000 MG tablet Commonly known as: GLUCOPHAGE Take 1 tablet (1,000 mg total) by mouth 2 (two) times daily with a meal.   nitroGLYCERIN 0.4 MG SL tablet Commonly known as: NITROSTAT PLACE 1 TABLET (0.4 MG TOTAL) UNDER THE TONGUE EVERY 5 (FIVE) MINUTES AS NEEDED FOR CHEST PAIN.   ondansetron 4 MG tablet Commonly known as: Zofran Take 1 tablet (4 mg total) by mouth every 8 (eight) hours as needed for nausea or vomiting.   OneTouch Ultra test strip Generic drug: glucose blood USE TO TEST FOR BLOOD SUGAR THREE TIMES DAILY DX: E11.51   pantoprazole 40 MG tablet Commonly known as: PROTONIX Take 40 mg by mouth  daily.   Praluent 75 MG/ML Soaj Generic drug: Alirocumab Inject 75 mg into the skin every 14 (fourteen) days.   pramipexole 1 MG tablet Commonly known as: MIRAPEX Take 1 mg by mouth every evening.   pramipexole 1 MG tablet Commonly known as: MIRAPEX TAKE 2 TABLETS (2 MG TOTAL) BY MOUTH AT BEDTIME. DX G25.81   valsartan 80 MG tablet Commonly known as: DIOVAN Take 1 tablet (80 mg total) by mouth daily.       Allergies  Allergen Reactions  . Lyrica [Pregabalin] Other (See Comments)    Made patient very lethargic the next morning, very hard to patient to function, move, etc.   . Statins Other (See Comments)    Causes Restless Legs, rhabdomyolysis  . Metformin And Related     GI upset  . Trulicity [Dulaglutide] Other (See Comments)    GI side effects     Procedures/Studies: DG Chest 2 View  Result Date: 04/08/2019 CLINICAL DATA:  Nonproductive cough for 6 months. EXAM: CHEST - 2 VIEW COMPARISON:  09/18/2015 from [facility] FINDINGS: The heart size and mediastinal contours are within normal limits. Both lungs are clear. The visualized skeletal structures are unremarkable. IMPRESSION: No active cardiopulmonary disease. Electronically Signed   By: Danae Orleans M.D.   On: 04/08/2019 15:28   CT Angio Chest PE W/Cm &/Or Wo Cm  Result Date: 04/25/2019 CLINICAL DATA:  Chest pain and shortness of breath. COVID positive, recent hospital discharge. EXAM: CT ANGIOGRAPHY CHEST WITH CONTRAST TECHNIQUE: Multidetector CT imaging of the chest was performed using the standard protocol during bolus administration of intravenous contrast. Multiplanar CT image reconstructions and MIPs were obtained to evaluate the vascular anatomy. CONTRAST:  41mL OMNIPAQUE IOHEXOL 350 MG/ML SOLN COMPARISON:  Radiograph yesterday. Radiograph 04/14/2019 FINDINGS: Cardiovascular: There are no filling defects within the pulmonary arteries to suggest pulmonary embolus. Evaluation of the subsegmental branches is  limited at the lung bases due to motion. Heart is normal in size. Coronary artery calcifications/stents. The thoracic aorta is normal in caliber. No evidence of aortic dissection. Mediastinum/Nodes: Small mediastinal and bilateral hilar lymph nodes, not enlarged by size criteria. No esophageal wall thickening. No suspicious thyroid nodule. Lungs/Pleura: Multifocal linear, ground-glass, and consolidative opacities throughout both lungs. There is peripheral predominant distribution. Trace right pleural effusion. No pneumothorax. The trachea and mainstem bronchi are patent. Upper Abdomen: No acute findings. Musculoskeletal: There are no acute or suspicious osseous abnormalities. Review of the MIP images confirms the above findings. IMPRESSION: 1. No pulmonary embolus. 2. Multifocal linear, ground-glass, and consolidative opacities throughout both lungs. Findings are consistent with patient's history of COVID-19  pneumonia. Parenchymal involvement is moderate. Trace right pleural effusion. 3. Coronary artery calcifications/stents. Electronically Signed   By: Narda Rutherford M.D.   On: 04/25/2019 03:21   DG Chest Portable 1 View  Result Date: 04/26/2019 CLINICAL DATA:  Discharge this morning, previously admitted for COVID-19 04/09/2019 EXAM: PORTABLE CHEST 1 VIEW COMPARISON:  Radiograph 04/24/2019, CT 04/25/2019 FINDINGS: Bilateral mixed reticular and consolidative airspace opacities are again noted, increased from comparison radiograph 04/24/2019 and similar to CT performed earlier the same day. No pneumothorax. No effusion. The cardiomediastinal contours are unremarkable. No acute osseous or soft tissue abnormality. Degenerative changes are present in the imaged spine and shoulders. Telemetry leads overlie the chest. IMPRESSION: Bilateral mixed reticular and consolidative airspace opacities, compatible with pneumonia, increasing from 1 day prior. Electronically Signed   By: Kreg Shropshire M.D.   On: 04/26/2019 00:19    DG Chest Portable 1 View  Result Date: 04/25/2019 CLINICAL DATA:  Left-sided chest pain over the last hour. Coronavirus infection. EXAM: PORTABLE CHEST 1 VIEW COMPARISON:  04/14/2019 FINDINGS: Heart size is normal. Coronary artery stents are visible. Widespread patchy pulmonary infiltrates again evident affecting both lungs, left worse than right. There is been some clearing at the left base. No worsening or new finding. No effusion. No bone abnormality IMPRESSION: Patchy bilateral pulmonary infiltrates left more than right, but with some clearing at the left base since study of 10 days ago. Electronically Signed   By: Paulina Fusi M.D.   On: 04/25/2019 00:24   DG Chest Portable 1 View  Result Date: 04/14/2019 CLINICAL DATA:  Chest tightness, COVID-19 positive EXAM: PORTABLE CHEST 1 VIEW COMPARISON:  Radiograph 09/18/2015 FINDINGS: Multifocal min areas of patchy airspace opacity present throughout both lungs. No pneumothorax or effusion. The cardiomediastinal contours are unremarkable. No acute osseous or soft tissue abnormality. Mild degenerative changes in the shoulders. Cardiac monitoring leads overlie the chest. IMPRESSION: Multifocal airspace disease compatible with atypical pneumonia in the setting of COVID-19 positivity. Electronically Signed   By: Kreg Shropshire M.D.   On: 04/14/2019 05:13    Subjective: No acute issues or events overnight, tolerating p.o. well, no longer having any episodes of dyspnea, fever, or chills; patient denies chest pain, headache, nausea, vomiting, diarrhea, constipation.   Discharge Exam: Vitals:   05/01/19 0358 05/01/19 0725  BP: (!) 155/75 (!) 156/91  Pulse: (!) 59 (!) 54  Resp: 18   Temp: 98.7 F (37.1 C)   SpO2: 91% 96%   Vitals:   04/30/19 1554 04/30/19 1910 05/01/19 0358 05/01/19 0725  BP: 128/62 132/79 (!) 155/75 (!) 156/91  Pulse: (!) 54 (!) 55 (!) 59 (!) 54  Resp: 18 18 18    Temp: 98.1 F (36.7 C) 98.1 F (36.7 C) 98.7 F (37.1 C)    TempSrc: Oral Oral Oral Oral  SpO2: 100% 99% 91% 96%  Weight:      Height:        General:  Pleasantly resting in bed, No acute distress. HEENT:  Normocephalic atraumatic.  Sclerae nonicteric, noninjected.  Extraocular movements intact bilaterally. Neck:  Without mass or deformity.  Trachea is midline. Lungs:  Clear to auscultate bilaterally without rhonchi, wheeze, or rales. Heart:  Regular rate and rhythm.  Without murmurs, rubs, or gallops. Abdomen:  Soft, nontender, nondistended.  Without guarding or rebound. Extremities: Without cyanosis, clubbing, edema, or obvious deformity. Vascular:  Dorsalis pedis and posterior tibial pulses palpable bilaterally. Skin:  Warm and dry, no erythema, no ulcerations.   The results of significant  diagnostics from this hospitalization (including imaging, microbiology, ancillary and laboratory) are listed below for reference.     Microbiology: Recent Results (from the past 240 hour(s))  Urine culture     Status: Abnormal   Collection Time: 04/26/19 12:35 AM   Specimen: In/Out Cath Urine  Result Value Ref Range Status   Specimen Description   Final    IN/OUT CATH URINE Performed at [facility], [address] [place], [place] [address]    Special Requests ADDED [phone] 04/26/2019  Final   Culture (A)  Final    30,000 COLONIES/mL STAPHYLOCOCCUS EPIDERMIDIS 10,000 COLONIES/mL ESCHERICHIA COLI    Report Status 04/28/2019 FINAL  Final   Organism ID, Bacteria STAPHYLOCOCCUS EPIDERMIDIS (A)  Final   Organism ID, Bacteria ESCHERICHIA COLI (A)  Final      Susceptibility   Escherichia coli - MIC*    AMPICILLIN 8 SENSITIVE Sensitive     CEFAZOLIN <=4 SENSITIVE Sensitive     CEFTRIAXONE <=0.25 SENSITIVE Sensitive     CIPROFLOXACIN <=0.25 SENSITIVE Sensitive     GENTAMICIN <=1 SENSITIVE Sensitive     IMIPENEM <=0.25 SENSITIVE Sensitive     NITROFURANTOIN 32 SENSITIVE Sensitive     TRIMETH/SULFA <=20 SENSITIVE Sensitive      AMPICILLIN/SULBACTAM 4 SENSITIVE Sensitive     PIP/TAZO <=4 SENSITIVE Sensitive     * 10,000 COLONIES/mL ESCHERICHIA COLI   Staphylococcus epidermidis - MIC*    CIPROFLOXACIN 1 SENSITIVE Sensitive     GENTAMICIN <=0.5 SENSITIVE Sensitive     NITROFURANTOIN <=16 SENSITIVE Sensitive     OXACILLIN <=0.25 SENSITIVE Sensitive     TETRACYCLINE >=16 RESISTANT Resistant     VANCOMYCIN 2 SENSITIVE Sensitive     TRIMETH/SULFA <=10 SENSITIVE Sensitive     CLINDAMYCIN RESISTANT Resistant     RIFAMPIN <=0.5 SENSITIVE Sensitive     Inducible Clindamycin POSITIVE Resistant     * 30,000 COLONIES/mL STAPHYLOCOCCUS EPIDERMIDIS  Blood Culture (routine x 2)     Status: None   Collection Time: 04/26/19  1:30 AM   Specimen: BLOOD  Result Value Ref Range Status   Specimen Description BLOOD SITE NOT SPECIFIED  Final   Special Requests   Final    BOTTLES DRAWN AEROBIC AND ANAEROBIC Blood Culture adequate volume   Culture   Final    NO GROWTH 5 DAYS Performed at [facility], [address] [place], [place] [address]    Report Status 05/01/2019 FINAL  Final  Blood Culture (routine x 2)     Status: None   Collection Time: 04/26/19  1:40 AM   Specimen: BLOOD  Result Value Ref Range Status   Specimen Description BLOOD LEFT ANTECUBITAL  Final   Special Requests   Final    BOTTLES DRAWN AEROBIC AND ANAEROBIC Blood Culture adequate volume   Culture   Final    NO GROWTH 5 DAYS Performed at [facility], [address] [place], [place] [address]    Report Status 05/01/2019 FINAL  Final  MRSA PCR Screening     Status: None   Collection Time: 04/26/19  9:17 AM   Specimen: Nasal Mucosa; Nasopharyngeal  Result Value Ref Range Status   MRSA by PCR NEGATIVE NEGATIVE Final    Comment:        The GeneXpert MRSA Assay (FDA approved for NASAL specimens only), is one component of a comprehensive MRSA colonization surveillance program. It is not intended to diagnose MRSA infection nor to guide  or  monitor treatment for MRSA infections. Performed at [facility], [address] [place], [place] [address]      Labs: BNP (last 3 results) Recent Labs    04/14/19 0540 04/25/19 0233  BNP 83.0 52.7   Basic Metabolic Panel: Recent Labs  Lab 04/26/19 0530 04/26/19 0530 04/27/19 0237 04/27/19 0337 04/28/19 0332 04/29/19 0134 04/30/19 0200 05/01/19 0344  NA 135   < > 130*  --  136 133* 136 136  K 4.0   < > 3.8  --  4.6 4.8 5.0 4.9  CL 100   < > 97*  --  102 102 103 100  CO2 28   < > 25  --  24 23 24 25   GLUCOSE 84   < > 131*  --  151* 320* 247* 201*  BUN 21   < > 21  --  22 27* 27* 26*  CREATININE 1.28*   < > 1.17  --  1.07 1.15 0.98 0.93  CALCIUM 7.9*   < > 7.8*  --  8.2* 8.4* 8.2* 8.4*  MG 1.9  --   --  1.7 2.1  --   --   --   PHOS 3.2  --   --  2.4* 4.1  --   --   --    < > = values in this interval not displayed.   Liver Function Tests: Recent Labs  Lab 04/27/19 0237 04/28/19 0332 04/29/19 0134 04/30/19 0200 05/01/19 0344  AST 13* 18 16 14* 17  ALT 17 21 23 21 25   ALKPHOS 55 64 61 68 61  BILITOT 1.1 0.8 0.7 0.7 0.5  PROT 5.4* 5.9* 5.7* 5.7* 5.6*  ALBUMIN 2.3* 2.4* 2.3* 2.3* 2.5*   No results for input(s): LIPASE, AMYLASE in the last 168 hours. No results for input(s): AMMONIA in the last 168 hours. CBC: Recent Labs  Lab 04/27/19 0237 04/27/19 0237 04/27/19 1430 04/28/19 0332 04/29/19 0134 04/30/19 0200 05/01/19 0344  WBC 8.8  --   --  5.3 8.8 8.5 7.8  NEUTROABS 7.8*  --   --  4.7 8.0* 7.8* 6.7  HGB 13.4  --   --  13.1 12.6* 12.7* 12.5*  HCT 40.3  --   --  40.1 38.5* 37.5* 37.1*  MCV 91.0  --   --  91.3 90.4 89.1 88.8  PLT 56*   < > 54* 65* 76* 94* 109*   < > = values in this interval not displayed.   Cardiac Enzymes: No results for input(s): CKTOTAL, CKMB, CKMBINDEX, TROPONINI in the last 168 hours. BNP: Invalid input(s): POCBNP CBG: Recent Labs  Lab 04/30/19 1113 04/30/19 1937 04/30/19 2045 05/01/19 0356  05/01/19 0721  GLUCAP 199* 276* 257* 194* 159*   D-Dimer Recent Labs    04/30/19 0200 05/01/19 0344  DDIMER 0.99* 0.90*   Hgb A1c No results for input(s): HGBA1C in the last 72 hours. Lipid Profile No results for input(s): CHOL, HDL, LDLCALC, TRIG, CHOLHDL, LDLDIRECT in the last 72 hours. Thyroid function studies No results for input(s): TSH, T4TOTAL, T3FREE, THYROIDAB in the last 72 hours.  Invalid input(s): FREET3 Anemia work up Recent Labs    04/30/19 0200 05/01/19 0344  FERRITIN 742* 468*   Urinalysis    Component Value Date/Time   COLORURINE YELLOW 04/26/2019 0112   APPEARANCEUR CLEAR 04/26/2019 0112   LABSPEC <1.005 (L) 04/26/2019 0112   PHURINE 6.0 04/26/2019 0112   GLUCOSEU NEGATIVE 04/26/2019 0112   HGBUR NEGATIVE 04/26/2019  0112   BILIRUBINUR NEGATIVE 04/26/2019 0112   KETONESUR NEGATIVE 04/26/2019 0112   PROTEINUR NEGATIVE 04/26/2019 0112   UROBILINOGEN 0.2 09/21/2013 0925   NITRITE NEGATIVE 04/26/2019 0112   LEUKOCYTESUR NEGATIVE 04/26/2019 0112   Sepsis Labs Invalid input(s): PROCALCITONIN,  WBC,  LACTICIDVEN Microbiology Recent Results (from the past 240 hour(s))  Urine culture     Status: Abnormal   Collection Time: 04/26/19 12:35 AM   Specimen: In/Out Cath Urine  Result Value Ref Range Status   Specimen Description   Final    IN/OUT CATH URINE Performed at [facility], [address] [place], [place] [address]    Special Requests ADDED [phone] 04/26/2019  Final   Culture (A)  Final    30,000 COLONIES/mL STAPHYLOCOCCUS EPIDERMIDIS 10,000 COLONIES/mL ESCHERICHIA COLI    Report Status 04/28/2019 FINAL  Final   Organism ID, Bacteria STAPHYLOCOCCUS EPIDERMIDIS (A)  Final   Organism ID, Bacteria ESCHERICHIA COLI (A)  Final      Susceptibility   Escherichia coli - MIC*    AMPICILLIN 8 SENSITIVE Sensitive     CEFAZOLIN <=4 SENSITIVE Sensitive     CEFTRIAXONE <=0.25 SENSITIVE Sensitive     CIPROFLOXACIN <=0.25 SENSITIVE Sensitive      GENTAMICIN <=1 SENSITIVE Sensitive     IMIPENEM <=0.25 SENSITIVE Sensitive     NITROFURANTOIN 32 SENSITIVE Sensitive     TRIMETH/SULFA <=20 SENSITIVE Sensitive     AMPICILLIN/SULBACTAM 4 SENSITIVE Sensitive     PIP/TAZO <=4 SENSITIVE Sensitive     * 10,000 COLONIES/mL ESCHERICHIA COLI   Staphylococcus epidermidis - MIC*    CIPROFLOXACIN 1 SENSITIVE Sensitive     GENTAMICIN <=0.5 SENSITIVE Sensitive     NITROFURANTOIN <=16 SENSITIVE Sensitive     OXACILLIN <=0.25 SENSITIVE Sensitive     TETRACYCLINE >=16 RESISTANT Resistant     VANCOMYCIN 2 SENSITIVE Sensitive     TRIMETH/SULFA <=10 SENSITIVE Sensitive     CLINDAMYCIN RESISTANT Resistant     RIFAMPIN <=0.5 SENSITIVE Sensitive     Inducible Clindamycin POSITIVE Resistant     * 30,000 COLONIES/mL STAPHYLOCOCCUS EPIDERMIDIS  Blood Culture (routine x 2)     Status: None   Collection Time: 04/26/19  1:30 AM   Specimen: BLOOD  Result Value Ref Range Status   Specimen Description BLOOD SITE NOT SPECIFIED  Final   Special Requests   Final    BOTTLES DRAWN AEROBIC AND ANAEROBIC Blood Culture adequate volume   Culture   Final    NO GROWTH 5 DAYS Performed at [facility], [address], [place], [place] [address]    Report Status 05/01/2019 FINAL  Final  Blood Culture (routine x 2)     Status: None   Collection Time: 04/26/19  1:40 AM   Specimen: BLOOD  Result Value Ref Range Status   Specimen Description BLOOD LEFT ANTECUBITAL  Final   Special Requests   Final    BOTTLES DRAWN AEROBIC AND ANAEROBIC Blood Culture adequate volume   Culture   Final    NO GROWTH 5 DAYS Performed at [facility], [address] [place], [place] [address]    Report Status 05/01/2019 FINAL  Final  MRSA PCR Screening     Status: None   Collection Time: 04/26/19  9:17 AM   Specimen: Nasal Mucosa; Nasopharyngeal  Result Value Ref Range Status   MRSA by PCR NEGATIVE NEGATIVE Final    Comment:        The GeneXpert MRSA Assay (  FDA approved  for NASAL specimens only), is one component of a comprehensive MRSA colonization surveillance program. It is not intended to diagnose MRSA infection nor to guide or monitor treatment for MRSA infections. Performed at [facility], [place] [name] [place], [place] [address]      Time coordinating discharge: Over 30 minutes  SIGNED:   Little Ishikawa, DO [facility] 05/01/2019, 10:52 AM Pager   If 7PM-7AM, please contact night-coverage [email or web address] Password [email or web address]

## 2019-05-03 ENCOUNTER — Other Ambulatory Visit: Payer: Medicare Other

## 2019-05-03 ENCOUNTER — Encounter: Payer: Self-pay | Admitting: Internal Medicine

## 2019-05-03 ENCOUNTER — Ambulatory Visit (INDEPENDENT_AMBULATORY_CARE_PROVIDER_SITE_OTHER): Payer: Medicare Other | Admitting: Internal Medicine

## 2019-05-03 ENCOUNTER — Other Ambulatory Visit: Payer: Self-pay

## 2019-05-03 ENCOUNTER — Telehealth: Payer: Self-pay | Admitting: *Deleted

## 2019-05-03 DIAGNOSIS — E1159 Type 2 diabetes mellitus with other circulatory complications: Secondary | ICD-10-CM | POA: Diagnosis not present

## 2019-05-03 DIAGNOSIS — J1282 Pneumonia due to coronavirus disease 2019: Secondary | ICD-10-CM

## 2019-05-03 DIAGNOSIS — E1165 Type 2 diabetes mellitus with hyperglycemia: Secondary | ICD-10-CM

## 2019-05-03 DIAGNOSIS — U071 COVID-19: Secondary | ICD-10-CM

## 2019-05-03 DIAGNOSIS — E162 Hypoglycemia, unspecified: Secondary | ICD-10-CM | POA: Diagnosis not present

## 2019-05-03 DIAGNOSIS — Z794 Long term (current) use of insulin: Secondary | ICD-10-CM | POA: Diagnosis not present

## 2019-05-03 DIAGNOSIS — J189 Pneumonia, unspecified organism: Secondary | ICD-10-CM

## 2019-05-03 DIAGNOSIS — IMO0002 Reserved for concepts with insufficient information to code with codable children: Secondary | ICD-10-CM

## 2019-05-03 HISTORY — DX: Pneumonia, unspecified organism: J18.9

## 2019-05-03 HISTORY — DX: COVID-19: U07.1

## 2019-05-03 MED ORDER — FREESTYLE LIBRE 14 DAY READER DEVI: 1.0000 | Freq: Every day | 0 refills | Status: DC | PRN

## 2019-05-03 MED ORDER — NOVOLOG FLEXPEN 100 UNIT/ML ~~LOC~~ SOPN: PEN_INJECTOR | SUBCUTANEOUS | 11 refills | Status: DC

## 2019-05-03 MED ORDER — BENZONATATE 200 MG PO CAPS: 200.0000 mg | ORAL_CAPSULE | Freq: Three times a day (TID) | ORAL | 0 refills | Status: DC | PRN

## 2019-05-03 MED ORDER — FREESTYLE LIBRE 14 DAY SENSOR MISC: 12 refills | Status: DC

## 2019-05-03 NOTE — Telephone Encounter (Signed)
Patient has a my chart video visit with Dr. Renato Gails today@ 2:45

## 2019-05-03 NOTE — Telephone Encounter (Signed)
Transition Care Management Follow-up Telephone Call  Date of discharge and from where: [facility] 05/01/2019   How have you been since you were released from the hospital? Getter better  Any questions or concerns? No   Items Reviewed:  Did the pt receive and understand the discharge instructions provided? Yes   Medications obtained and verified? Yes   Any new allergies since your discharge? No   Dietary orders reviewed? Yes  Do you have support at home? Yes Wife  Other (ie: DME, Home Health, etc) NO  Functional Questionnaire: (I = Independent and D = Dependent) ADL's: I  Bathing/Dressing- I   Meal Prep- I  Eating- I  Maintaining continence- I  Transferring/Ambulation- I  Managing Meds- I   Follow up appointments reviewed:    PCP Hospital f/u appt confirmed? Yes  Scheduled to see Dr. Renato Gails on 05/03/19 .  Specialist Hospital f/u appt confirmed? No    Are transportation arrangements needed? No   If their condition worsens, is the pt aware to call  their PCP or go to the ED? Yes  Was the patient provided with contact information for the PCP's office or ED? Yes  Was the pt encouraged to call back with questions or concerns? Yes

## 2019-05-03 NOTE — Telephone Encounter (Signed)
I have made the 1st attempt to contact the patient or family member in charge, in order to follow up from recently being discharged from the hospital. I left a message on voicemail but I will make another attempt at a different time.  

## 2019-05-03 NOTE — Progress Notes (Signed)
Patient ID: Kenneth Leavell Sr., male   DOB: May 30, 1950, 69 y.o.   MRN: [id number] This service is provided via mychart video visit.  No vital signs collected/recorded due to the encounter was a telemedicine visit.   Location of patient (ex: home, work): home   Patient consents to a telephone visit: yes  Location of the provider (ex: office, home):  [facility] Name of any referring provider:  Bufford Spikes DO  Names of all persons participating in the telemedicine service and their role in the encounter: Dr. Bufford Spikes DO, Charlene CMA, and Patient Time spent on call: 10 with medical assistant   Virtual Visit via Video Note  I connected with Kenneth Sic Sr. on 05/03/19 at  2:45 PM EST by a video enabled telemedicine application and verified that I am speaking with the correct person using two identifiers.  Location: Patient: Home Provider: [facility] office   I discussed the limitations of evaluation and management by telemedicine and the availability of in person appointments. The patient expressed understanding and agreed to proceed.  History of Present Illness: 69 yo male with h/o recent covid infection back on 04/09/19.  He had initially had a video visit way back on 03/31/19 when NP Ngetich's note indicated cough, runny nose and bronchitis for which he was treated with abx and his albuterol inhaler with advice to call back if fever developed.  2/4, another visit was done where he was coughing up yellow mucus, poor appetite, no sick contacts--he was to complete the abx and get a CXR, push fluids and then go to the ED if he got a fever.  2/5, CXR was negative and he was finally advised to go get a covid test or to go the ED if no better.  He was then treated with the monoclonal ab infusion at [facility] 2/7 for covid-19.  He was also treated with mucinex DM, zinc, vitamin D, Vitamin C and phenergan.  Appetite remained poor w/o taste or smell.  zofran was given and increase fluids, quarantine. 2/10 to 2/14, he  was hospitalized with acute respiratory failure with hypoxia and found to have pneumonia from Covid-19.  He was treated with 5 days of remdesivir and encouraged to use incentive spirometry, flutter valve.  Valsartan was held but restarted at discharge and coreg decreased to 3.125mg  po bid due to bradycardia.  CBGs were high on decadron and hba1c had been 9.2 last here.  He had another video visit for TOC on 2/16 with Dinah--he was still SOB with exertion, hoarse, tired, no appetite.  CBG was 350 and still on decadron at that time.  BPs were satisfactory.  2/20 to 2/21, he was in the hospital again with AKI.  He's been home since Saturday.  He's very weak but alive.  He does now have a little appetite back.  He's eating about 1/3 of what he normaly has.  He's up walking.  Elevating feet at night.  They do go back down some.  He's still got a cough medicine benzonatate 200mg  that he needs refilled.  It is helping him to stop coughing at night.    Sugars are still high.  He feels like he needs some short-acting insulin b/c it's going up sky high when he eats, then in plummets.  312 at 3am, 72 at 6am.  Had only taken metformin 1000mg  at breakfast.  Takes basaglar long-acting 40 units.  If it's 150-200, he uses less.  He will use 20-25 units instead.  He asks if he can stop pepcid and continue pantoprazole.    BP has been fine at home.  O2 92-93%.  HR 67-68 which is normal for him.  If he takes a brisk walk, it goes up to about 70.    Observations/Objective: Awake, alert, appears to have lost weight, slight pallor  Assessment and Plan: 1. Uncontrolled diabetes mellitus with circulatory complication, with long-term current use of insulin (HCC) - cont basaglar long-acting insulin 40 units at hs which he reduces if CBGs under 200 -add novolog with meals to adjust based on carb intake when he gets off prednisone therapy - insulin aspart (NOVOLOG FLEXPEN) 100 UNIT/ML FlexPen; Based on carbohydrate intake   Dispense: 15 mL; Refill: 11 -the CMAs have previously already tried to send in two devices to the pharmacy for him that avoid fingersticks and both were too expensive when ready to be picked up -he'd asked again today, but I did not know this had been done before -recommended freestyle Elenor Legato so his wife can check his sugar if he's lethargic and having a low -educated on treatment of hypoglycemia and being sure to not just have a sweet item, but then to eat something with protein afterward (using egg sandwiches)  2. Pneumonia due to COVID-19 virus - slowly improving - finishing steroid taper -renewed prn tessalon perles - benzonatate (TESSALON) 200 MG capsule; Take 1 capsule (200 mg total) by mouth 3 (three) times daily as needed for cough.  Dispense: 30 capsule; Refill: 0  3. Hypoglycemia -reeducated on mgt of lows  I may refer him to endocrinology next visit since he's new to mealtime insulin  Follow Up Instructions: I discussed the assessment and treatment plan with the patient. The patient was provided an opportunity to ask questions and all were answered. The patient agreed with the plan and demonstrated an understanding of the instructions.   The patient was advised to call back or seek an in-person evaluation if the symptoms worsen or if the condition fails to improve as anticipated.  F/u in 2 wks in person for lung exam and f/u on sugars.  I provided 45 minutes of non-face-to-face time during this encounter reviewing his multiple hospitalizations, addressing current concerns and reconciling his medications.    [name], D.O. [facility] [address], [place] [address] Cell Phone (Mon-Fri 8am-5pm):  [phone] On Call:  [phone] & follow prompts after 5pm & weekends Office Phone:  [phone] Office Fax:  [phone]

## 2019-05-04 ENCOUNTER — Emergency Department (HOSPITAL_COMMUNITY): Payer: Medicare Other

## 2019-05-04 ENCOUNTER — Other Ambulatory Visit: Payer: Medicare Other

## 2019-05-04 ENCOUNTER — Other Ambulatory Visit: Payer: Self-pay

## 2019-05-04 ENCOUNTER — Encounter (HOSPITAL_COMMUNITY): Payer: Self-pay | Admitting: Emergency Medicine

## 2019-05-04 ENCOUNTER — Emergency Department (HOSPITAL_COMMUNITY)
Admission: EM | Admit: 2019-05-04 | Discharge: 2019-05-04 | Disposition: A | Discharge status: Home / Self Care | Payer: Medicare Other | Attending: Emergency Medicine | Admitting: Emergency Medicine

## 2019-05-04 DIAGNOSIS — Z79899 Other long term (current) drug therapy: Secondary | ICD-10-CM | POA: Insufficient documentation

## 2019-05-04 DIAGNOSIS — Z8616 Personal history of COVID-19: Secondary | ICD-10-CM | POA: Insufficient documentation

## 2019-05-04 DIAGNOSIS — R0602 Shortness of breath: Secondary | ICD-10-CM | POA: Diagnosis not present

## 2019-05-04 DIAGNOSIS — R0902 Hypoxemia: Secondary | ICD-10-CM | POA: Diagnosis not present

## 2019-05-04 DIAGNOSIS — I10 Essential (primary) hypertension: Secondary | ICD-10-CM | POA: Diagnosis not present

## 2019-05-04 DIAGNOSIS — Z7982 Long term (current) use of aspirin: Secondary | ICD-10-CM | POA: Diagnosis not present

## 2019-05-04 DIAGNOSIS — Z794 Long term (current) use of insulin: Secondary | ICD-10-CM | POA: Insufficient documentation

## 2019-05-04 DIAGNOSIS — E119 Type 2 diabetes mellitus without complications: Secondary | ICD-10-CM | POA: Diagnosis not present

## 2019-05-04 DIAGNOSIS — I2510 Atherosclerotic heart disease of native coronary artery without angina pectoris: Secondary | ICD-10-CM | POA: Insufficient documentation

## 2019-05-04 DIAGNOSIS — Z209 Contact with and (suspected) exposure to unspecified communicable disease: Secondary | ICD-10-CM | POA: Diagnosis not present

## 2019-05-04 DIAGNOSIS — R0789 Other chest pain: Secondary | ICD-10-CM | POA: Diagnosis not present

## 2019-05-04 DIAGNOSIS — R079 Chest pain, unspecified: Secondary | ICD-10-CM | POA: Diagnosis not present

## 2019-05-04 LAB — BASIC METABOLIC PANEL
Anion gap: 11 (ref 5–15)
BUN: 30 mg/dL — ABNORMAL HIGH (ref 8–23)
CO2: 25 mmol/L (ref 22–32)
Calcium: 8.3 mg/dL — ABNORMAL LOW (ref 8.9–10.3)
Chloride: 99 mmol/L (ref 98–111)
Creatinine, Ser: 1.23 mg/dL (ref 0.61–1.24)
GFR calc Af Amer: >60 mL/min (ref >60)
GFR calc non Af Amer: 60 mL/min — ABNORMAL LOW (ref >60)
Glucose, Bld: 152 mg/dL — ABNORMAL HIGH (ref 70–99)
Potassium: 4.2 mmol/L (ref 3.5–5.1)
Sodium: 135 mmol/L (ref 135–145)

## 2019-05-04 LAB — CBC
HCT: 40.7 % (ref 39.0–52.0)
Hemoglobin: 13.3 g/dL (ref 13.0–17.0)
MCH: 30 pg (ref 26.0–34.0)
MCHC: 32.7 g/dL (ref 30.0–36.0)
MCV: 91.9 fL (ref 80.0–100.0)
Platelets: 134 10*3/uL — ABNORMAL LOW (ref 150–400)
RBC: 4.43 MIL/uL (ref 4.22–5.81)
RDW: 13.9 % (ref 11.5–15.5)
WBC: 9 10*3/uL (ref 4.0–10.5)
nRBC: 0 % (ref 0.0–0.2)

## 2019-05-04 LAB — TROPONIN I (HIGH SENSITIVITY)
Troponin I (High Sensitivity): 10 ng/L (ref <18)
Troponin I (High Sensitivity): 12 ng/L (ref <18)

## 2019-05-04 LAB — CBG MONITORING, ED
Glucose-Capillary: 134 mg/dL — ABNORMAL HIGH (ref 70–99)
Glucose-Capillary: 54 mg/dL — ABNORMAL LOW (ref 70–99)

## 2019-05-04 NOTE — Discharge Instructions (Signed)

## 2019-05-04 NOTE — ED Notes (Addendum)
PT reported feeling like his blood sugar was low. CBG was taken and found to be 54. Ryan RN was notified and PT was given 480 mL orange juice. CBG will be rechecked to look for improvement.

## 2019-05-04 NOTE — ED Notes (Signed)
Pt discharge instructions reviewed with the patient. The patient verbalized understanding of instructions.  

## 2019-05-04 NOTE — ED Provider Notes (Signed)
[facility] EMERGENCY DEPARTMENT Provider Note   CSN: [id number] Arrival date & time: 05/04/19  7619     History Chief Complaint  Patient presents with  . Chest Pain  . Shortness of Breath    Kenneth Chiang Sr. is a 69 y.o. male with an extensive past medical history including Barrett's esophagus, chronic reflux, insulin-dependent but poorly controlled diabetes mellitus, chronic bronchitis, history of unstable angina and native coronary artery disease, recent COVID-19 infection and admissions for hypoxic respiratory failure and acute kidney injury.  Inpatient has been doing better after his infection this past month.  He states that this morning he awoke around 4 AM to use the bathroom and noticed that he was having severe chest pressure and a retrosternal chest pain patient felt very short of breath.  He rates it initial pain at 8 out of 10.  He called EMS and was given 3 sublingual nitroglycerin and aspirin and now has 6 out of 10 pain which she says is moderate.  He denies cough, fever, chills.  He denies pleuritic chest pain, unilateral leg swelling.  Patient has had bilateral lower extremity edema which he says is new and has been present since he was hospitalized for COVID-19.  He denies orthopnea or PND.  HPI  HPI: A 69 year old patient with a history of treated diabetes, hypertension and hypercholesterolemia presents for evaluation of chest pain. Initial onset of pain was less than one hour ago. The patient's chest pain is described as heaviness/pressure/tightness, is not worse with exertion and is relieved by nitroglycerin. The patient's chest pain is middle- or left-sided, is not well-localized, is not sharp and does not radiate to the arms/jaw/neck. The patient does not complain of nausea and denies diaphoresis. The patient has no history of stroke, has no history of peripheral artery disease, has not smoked in the past 90 days, has no relevant family history of coronary  artery disease (first degree relative at less than age 34) and does not have an elevated BMI (>=30).   Past Medical History:  Diagnosis Date  . Arthritis    "left thumb" (05/23/2017)  . Barrett's esophagus    "we've been told that it's all gone; still take RX for GERD" (05/23/2017)  . CAD (coronary artery disease), native coronary artery    Coronary angiogram 05/03/2014:  Proximal LAD 3.0x12 mm Promus premier stent.  04/08/2014: Mid Cx 3.5 x 16 mm Promus DES.  03/29/2013: Mid LAD 2.75 x 38 mm Promus Premier drug-eluting stent, balloon angioplasty of D1 and distal LAD and stenting of distal RCA with 2.75 x 24 mm promos Premier drug-eluting stent 12/15/2012 widely patent.  . Cervicalgia   . Chronic bronchitis (HCC)    "q yr" (05/23/2017)  . Complication of anesthesia    " I do not wake up very well "  . COVID-19   . Family history of adverse reaction to anesthesia    "son w/PONV"  . GERD (gastroesophageal reflux disease)   . Heart murmur    "grew out of it"   . Hyperlipidemia   . Hypoglycemia, unspecified   . IDDM (insulin dependent diabetes mellitus)   . Impotence of organic origin   . Iron deficiency anemia   . Other malaise and fatigue   . PONV (postoperative nausea and vomiting)   . Restless leg   . Tension headache    "sometimes" (05/23/2017)  . Unspecified gastritis and gastroduodenitis with hemorrhage   . Unstable angina pectoris (HCC) 04/08/2014   Coronary  angiogram 05/03/2014:  Proximal LAD 3.0x12 mm Promus premier stent.  04/08/2014: Mid Cx 3.5 x 16 mm Promus DES.  03/29/2013: Mid LAD 2.75 x 38 mm Promus Premier drug-eluting stent, balloon angioplasty of D1 and distal LAD and stenting of distal RCA with 2.75 x 24 mm promos Premier drug-eluting stent 12/15/2012 widely patent.    Patient Active Problem List   Diagnosis Date Noted  . Gastritis with hemorrhage 04/27/2019  . Diabetes mellitus type 2, uncontrolled, with complications (HCC) 04/27/2019  . HLD (hyperlipidemia) 04/27/2019  .  Restless leg syndrome 04/27/2019  . Chronic iron deficiency anemia 04/27/2019  . Barrett's esophagus with esophagitis 04/27/2019  . Pneumonia due to COVID-19 virus 04/27/2019  . HCAP (healthcare-associated pneumonia) 04/27/2019  . Severe sepsis (HCC) 04/27/2019  . Severe sepsis with septic shock (HCC) 04/27/2019  . Acute lower UTI 04/27/2019  . Sepsis due to pneumonia (HCC) 04/26/2019  . Mild renal insufficiency 04/26/2019  . Hyponatremia 04/26/2019  . Thrombocytopenia (HCC) 04/26/2019  . Acute respiratory failure with hypoxia (HCC) 04/14/2019  . COVID-19 virus infection 04/14/2019  . Hypokalemia 04/14/2019  . Diabetes mellitus type 2, uncontrolled (HCC) 04/14/2019  . GERD (gastroesophageal reflux disease) 04/14/2019  . Simple chronic bronchitis (HCC) 10/09/2017  . 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 TSH elevation 07/05/2015  . Plantar fasciitis 04/26/2015  . Tremor 02/14/2015  . Voice disorder 10/19/2014  . Unstable angina (HCC) 04/08/2014  . Anemia, iron deficiency 09/22/2013  . Shortness of breath 08/31/2013  . Achilles tendon pain 06/16/2013  . CAD (coronary artery disease), native coronary artery 12/15/2012  . Uncontrolled diabetes mellitus with circulatory complication, with long-term current use of insulin (HCC) 06/16/2012  . Impotence of organic origin 06/16/2012  . Umbilical hernia without mention of obstruction or gangrene 06/16/2012  . Barrett's esophagus 06/16/2012  . Gastritis and gastroduodenitis with hemorrhage 06/16/2012  . 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 Hyperlipidemia 06/16/2012  . Cholelithiasis 06/16/2012  . Slowing of urinary stream 06/16/2012  . Pain in joint, lower leg 06/16/2012  . Essential hypertension 06/16/2012  . Restless legs syndrome (RLS) 06/16/2012  . Tension headache 06/16/2012    Past Surgical History:  Procedure Laterality Date  . CARDIAC CATHETERIZATION N/A 09/25/2015   Procedure: Left  Heart Cath and Coronary Angiography;  Surgeon: [name], MD;  Location: [facility] INVASIVE CV LAB;  Service: Cardiovascular;  Laterality: N/A;  . CARDIAC CATHETERIZATION N/A 09/25/2015   Procedure: Intravascular Pressure Wire/FFR Study;  Surgeon: [name], MD;  Location: [facility] INVASIVE CV LAB;  Service: Cardiovascular;  Laterality: N/A;  . CORONARY ANGIOPLASTY WITH STENT PLACEMENT  12/15/2012; 04/28/2014; 05/03/2014   "2; 1; 1"   . FRACTIONAL FLOW RESERVE WIRE Right 03/26/2013   Procedure: FRACTIONAL FLOW RESERVE WIRE;  Surgeon: Pamella Pert, MD;  Location: [facility] CATH LAB;  Service: Cardiovascular;  Laterality: Right;  . FRACTIONAL FLOW RESERVE WIRE N/A 05/03/2014   Procedure: FRACTIONAL FLOW RESERVE WIRE;  Surgeon: Pamella Pert, MD;  Location: [facility] CATH LAB;  Service: Cardiovascular;  Laterality: N/A;  . HERNIA REPAIR  2010   "umbilical"   . INCISION AND DRAINAGE ABSCESS Left 05/2007   "groin"   . KNEE SURGERY Right 1992;  2000   "calcium deposits removed" (12/15/2012)  . LEFT HEART CATH AND CORONARY ANGIOGRAPHY N/A 05/23/2017   Procedure: LEFT HEART CATH AND CORONARY ANGIOGRAPHY;  Surgeon: [name], MD;  Location: [facility] INVASIVE CV LAB;  Service: Cardiovascular;  Laterality: N/A;  . LEFT HEART CATHETERIZATION  WITH CORONARY ANGIOGRAM N/A 12/15/2012   Procedure: LEFT HEART CATHETERIZATION WITH CORONARY ANGIOGRAM;  Surgeon: Pamella PertJagadeesh R Ganji, MD;  [facility];  Service: Cardiovascular;  Laterality: N/A;  . LEFT HEART CATHETERIZATION WITH CORONARY ANGIOGRAM N/A 03/26/2013   Procedure: LEFT HEART CATHETERIZATION WITH CORONARY ANGIOGRAM;  Surgeon: Pamella PertJagadeesh R Ganji, MD;  [facility];  Service: Cardiovascular;  Laterality: N/A;  . LEFT HEART CATHETERIZATION WITH CORONARY ANGIOGRAM N/A 04/08/2014   Procedure: LEFT HEART CATHETERIZATION WITH CORONARY ANGIOGRAM;  Surgeon: Micheline ChapmanMichael D Cooper, MD;  [facility];  Service: Cardiovascular;  Laterality: N/A;  . PERCUTANEOUS CORONARY STENT  INTERVENTION (PCI-S) N/A 05/03/2014   Procedure: PERCUTANEOUS CORONARY STENT INTERVENTION (PCI-S);  Surgeon: Pamella PertJagadeesh R Ganji, MD;  [facility];  Service: Cardiovascular;  Laterality: N/A;  . UMBILICAL HERNIA REPAIR         Family History  Adopted: Yes  Family history unknown: Yes    Social History   Tobacco Use  . Smoking status: Never Smoker  . Smokeless tobacco: Never Used  Substance Use Topics  . Alcohol use: No  . Drug use: No    Home Medications Prior to Admission medications   Medication Sig Start Date End Date Taking? Authorizing Provider  acetaminophen (TYLENOL) 500 MG tablet Take 500-1,000 mg by mouth every 6 (six) hours as needed (for pain/headaches.).    [provider]  albuterol (PROVENTIL HFA;VENTOLIN HFA) 108 (90 Base) MCG/ACT inhaler Inhale 2 puffs into the lungs every 6 (six) hours as needed for wheezing or shortness of breath. 06/08/18   Reed, Tiffany L, DO  Alirocumab (PRALUENT) 75 MG/ML SOAJ Inject 75 mg into the skin every 14 (fourteen) days. 04/27/19   Yates DecampGanji, Jay, MD  aspirin 81 MG EC tablet Take 1 tablet (81 mg total) by mouth daily. 12/16/12   Yates DecampGanji, Jay, MD  BD PEN NEEDLE NANO U/F 32G X 4 MM MISC USE TWICE DAILY WITH LANTUS PEN 06/01/18   Reed, Tiffany L, DO  benzonatate (TESSALON) 200 MG capsule Take 1 capsule (200 mg total) by mouth 3 (three) times daily as needed for cough. 05/03/19   Reed, Tiffany L, DO  carvedilol (COREG) 3.125 MG tablet Take 1 tablet (3.125 mg total) by mouth 2 (two) times daily with a meal. 04/18/19   Elgergawy, Leana Roeawood S, MD  Continuous Blood Gluc Receiver (FREESTYLE LIBRE 14 DAY READER) DEVI Inject 1 Device as directed daily as needed. Use once daily as direct to check blood sugar E11.51 05/03/19   Reed, Tiffany L, DO  Continuous Blood Gluc Sensor (FREESTYLE LIBRE 14 DAY SENSOR) MISC Use as directed to check blood sugar as needed. E11.51 05/03/19   Reed, Tiffany L, DO  [employer] VITAMIN C 500 MG tablet TAKE 1 TABLET BY MOUTH TWICE  DAILY 03/10/19   Reed, Tiffany L, DO  D-5000 125 MCG (5000 UT) TABS TAKE 1 TABLET BY MOUTH EVERY DAY 03/10/19   Reed, Tiffany L, DO  fluticasone (FLONASE) 50 MCG/ACT nasal spray Place 1 spray into both nostrils daily as needed for allergies or rhinitis.    [provider]  gabapentin (NEURONTIN) 100 MG capsule Take 300-400 mg by mouth at bedtime.  05/14/17   [provider]  insulin aspart (NOVOLOG FLEXPEN) 100 UNIT/ML FlexPen Based on carbohydrate intake 05/03/19   Reed, Tiffany L, DO  Insulin Glargine (BASAGLAR KWIKPEN) 100 UNIT/ML SOPN INJECT 40 UNITS SUBCUTANEOUSLY INTO THE SKIN AT BEDTIME 04/30/18   Reed, Tiffany L, DO  LIFESCAN FINEPOINT LANCETS  MISC Use to test for blood sugar three times daily dx: E11.51 06/01/18   [name], NP  metFORMIN (GLUCOPHAGE) 1000 MG tablet Take 1 tablet (1,000 mg total) by mouth 2 (two) times daily with a meal. 04/22/19   Janyth Contes, Janene Harvey, NP  nitroGLYCERIN (NITROSTAT) 0.4 MG SL tablet PLACE 1 TABLET (0.4 MG TOTAL) UNDER THE TONGUE EVERY 5 (FIVE) MINUTES AS NEEDED FOR CHEST PAIN. 06/23/17   Reed, Tiffany L, DO  ondansetron (ZOFRAN) 4 MG tablet Take 1 tablet (4 mg total) by mouth every 8 (eight) hours as needed for nausea or vomiting. 04/12/19   Ngetich, Donalee Citrin, NP  ONETOUCH ULTRA test strip USE TO TEST FOR BLOOD SUGAR THREE TIMES DAILY DX: E11.51 12/04/18   Reed, Tiffany L, DO  pantoprazole (PROTONIX) 40 MG tablet Take 40 mg by mouth daily.    [provider]  pramipexole (MIRAPEX) 1 MG tablet TAKE 2 TABLETS (2 MG TOTAL) BY MOUTH AT BEDTIME. DX G25.81 03/26/19   Reed, Tiffany L, DO  pramipexole (MIRAPEX) 1 MG tablet Take 1 mg by mouth every evening.    [provider]  predniSONE (DELTASONE) 10 MG tablet Take 10 mg by mouth daily with breakfast. 4 Tablets for 3 days. 3 tablets, days, 2 tabs for days and 1 tablet for days until he winged off. 05/01/19   [provider]  valsartan (DIOVAN) 80 MG tablet Take 1 tablet (80 mg  total) by mouth daily. 03/17/19   [name], MD    Allergies    Lyrica [pregabalin], Statins, Metformin and related, and Trulicity [dulaglutide]  Review of Systems   Review of Systems Ten systems reviewed and are negative for acute change, except as noted in the HPI.   Physical Exam Updated Vital Signs BP (!) 152/65   Pulse 61   Temp 97.7 F (36.5 C) (Oral)   Resp 13   Ht 5\' 8"  (1.727 m)   Wt 78 kg   SpO2 95%   BMI 26.15 kg/m   Physical Exam Vitals and nursing note reviewed.  Constitutional:      General: He is not in acute distress.    Appearance: He is well-developed. He is not diaphoretic.  HENT:     Head: Normocephalic and atraumatic.  Eyes:     General: No scleral icterus.    Conjunctiva/sclera: Conjunctivae normal.  Cardiovascular:     Rate and Rhythm: Normal rate and regular rhythm.     Heart sounds: Normal heart sounds.  Pulmonary:     Effort: Pulmonary effort is normal. No tachypnea or respiratory distress.     Breath sounds: Normal breath sounds. No decreased breath sounds, wheezing or rhonchi.  Abdominal:     Palpations: Abdomen is soft.     Tenderness: There is no abdominal tenderness.  Musculoskeletal:     Cervical back: Normal range of motion and neck supple.     Right lower leg: Edema present.     Left lower leg: Edema present.     Comments: 1+ edema BL lower extremities  Skin:    General: Skin is warm and dry.  Neurological:     Mental Status: He is alert.  Psychiatric:        Behavior: Behavior normal.     ED Results / Procedures / Treatments   Labs (all labs ordered are listed, but only abnormal results are displayed) Labs Reviewed  BASIC METABOLIC PANEL - Abnormal; Notable for the following components:      Result  Value   Glucose, Bld 152 (*)    BUN 30 (*)    Calcium 8.3 (*)    GFR calc non Af Amer 60 (*)    All other components within normal limits  CBC - Abnormal; Notable for the following components:   Platelets 134 (*)    All  other components within normal limits  CBG MONITORING, ED - Abnormal; Notable for the following components:   Glucose-Capillary 54 (*)    All other components within normal limits  TROPONIN I (HIGH SENSITIVITY)  TROPONIN I (HIGH SENSITIVITY)    EKG EKG Interpretation  Date/Time:  Tuesday May 04 2019 09:48:38 EST Ventricular Rate:  63 PR Interval:    QRS Duration: 99 QT Interval:  444 QTC Calculation: 455 R Axis:   -18 Text Interpretation: Sinus rhythm Probable left ventricular hypertrophy no significant change since Apr 25 2019 Confirmed by Sherwood Gambler [phone]) on 05/04/2019 10:53:02 AM   Radiology DG Chest Port 1 View  Result Date: 05/04/2019 CLINICAL DATA:  69 year old male with history of shortness of breath today. History of COVID-19 diagnosed January 10th. EXAM: PORTABLE CHEST 1 VIEW COMPARISON:  Chest x-ray 04/26/2019. FINDINGS: Lung volumes are low. Patchy ill-defined opacities and areas of interstitial prominence are noted throughout the lungs bilaterally (left greater than right), most severe throughout the left mid to lower lung. Aeration has minimally improved compared to the prior examination. No new areas of airspace consolidation. No pleural effusions. No evidence of pulmonary edema. Heart size is normal. Upper mediastinal contours are within normal limits. IMPRESSION: 1. The appearance of the lungs is compatible with resolving multilobar bilateral pneumonia (left greater than right) with probable developing post infectious or inflammatory scarring. Electronically Signed   By: Vinnie Langton M.D.   On: 05/04/2019 10:55    Procedures Procedures (including critical care time)  Medications Ordered in ED Medications - No data to display  ED Course  I have reviewed the triage vital signs and the nursing notes.  Pertinent labs & imaging results that were available during my care of the patient were reviewed by me and considered in my medical decision making (see chart  for details).  Clinical Course as of May 04 1419  Tue May 04, 2019  1229 I spoke with Dr.Ganji about this patient, whom he knows well. In shared decision we feel that he would do well with 2nd troponin and close OP follow up if his delta troponin is not markedly increased.   [AH]    Clinical Course User Index [AH] Margarita Mail, PA-C   MDM Rules/Calculators/A&P HEAR Score: 81                   69 year old male here with complaint of chest pain and shortness of breath.The emergent differential diagnosis for shortness of breath includes, but is not limited to, Pulmonary edema, bronchoconstriction, Pneumonia, Pulmonary embolism, Pneumotherax/ Hemothorax, Dysrythmia, ACS.  Patient has a history of recent Covid infection.  He had onset of chest pain shortness of breath today however his oxygen saturations are within normal limits.  He is without dyspnea at this time.  He has no pleuritic chest pain.  I have low suspicion for pulmonary embolus.  He has equal bilateral lower extremity edema. I reviewed the patient's EMR which shows a Cardiac catheterization 1 year ago.  Patient's work-up today shows 2 - delta troponins, CBC shows no elevated white blood cell count.  BMP shows slightly elevated BUN improved from previous, glucose is also mildly  elevated.  During his stay here his glucose did drop into the 50s but he is given orange juice.  He has not eaten today.  Patient's chest x-ray shows no pulmonary edema.  He has bilateral improving but still present multi lobar patchy infiltrates and some postinflammatory scarring on my interpretation.  I discussed the case with Dr. [name].  Feels that he is safe to follow-up from the standpoint of ACS.  To have low suspicion for other cause such as pulmonary embolus, new pneumonia.  Patient appears appropriate for discharge at this time with close PCP follow-up. Final Clinical Impression(s) / ED Diagnoses Final diagnoses:  Chest pain, unspecified type  SOB  (shortness of breath)    Rx / DC Orders ED Discharge Orders    None       Arthor Captain, PA-C 05/04/19 1432    Pricilla Loveless, MD 05/05/19 [phone]

## 2019-05-04 NOTE — ED Triage Notes (Signed)
Pt BIB [facility] from home. Pt complaining of SOB and centralized chest pressure that began approx. 0400 today. EMS 12 lead unremarkable. Pt is on 2 L [place] with SpO2 97%. VSS. NAD.

## 2019-05-05 ENCOUNTER — Encounter: Payer: Self-pay | Admitting: *Deleted

## 2019-05-05 ENCOUNTER — Other Ambulatory Visit: Payer: Self-pay | Admitting: *Deleted

## 2019-05-05 NOTE — Patient Outreach (Signed)
[name]) [place] Telephone Outreach PCP office completes Transition of Care follow up post-hospital discharge Post-hospital discharge day # 4  05/05/2019  Kenneth Kenneth Sr. Oct 01, 1950 [id number]  Successful telephone outreach to [name], 69 y/o male referred to [facility] RN CM 05/04/19 by [facility] liaison after 2 recent hospitalizations February 10-14, 2021 for COVID-19 associated pneumonia and then again February 21-27, 2021 for sepsis with hypotension related ongoing complications of [id number] infection.  Patient also had 2 recent ED visits, and was discharged home.  Patient was discharged from most recent inpatient hospitalization to self-care without home health services in place.  Patient has history including, but not limited to, DM-II with recent A1-C of 9.0; HTN/ HLD; CAD; and GERD.  HIPAA/ identity verified and [facility] CM program and services discussed with patient, who provides verbal consent for [facility] CM involvement in his care post- hospital discharge.  Patient provides verbal consent to speak with his spouse [name] "at any time."  Today, patient denies clinical concerns, pain, new/ recent falls, and states that he is "very tired after being up all day," but "overall feels somewhat better."  Patient sounds to be in no distress throughout phone call today.  Patient further reports:  Medications: -- Has all medicationsand takes as prescribed;denies questions/ concerns around current medications  -- Verbalizes good general understanding of the purpose, dosing, and scheduling of medications   -- self-manage medications -- acknowledges that he obtained his newly prescribed short-acting insulin and CGM as ordered post- PCP office visit 05/03/19 -- acknowledges that he is taking post-hospital discharge prednisone as ordered  -- patient declines medication review today stating "too tired after running errands with wife," but he is agreeable to  review at time of next outreach  Provider appointments: -- All recent/ upcoming provider appointments were reviewed with patient today- patient verbalizes accurate understanding of upcoming provider appointments, along with plans to attend all  Safety/ Mobility/ Falls: -- denies new/ recent falls- reports no falls over last year -- assistive devices: none -- general fall risks/ prevention education discussed with patient today  Social/ Community Resource needs: -- currently denies community resource needs, stating supportive family members that assist with care needs as indicated -- describes self as currently independent for ADL's and, (prior to becoming ill with COVID-19) iADL's; states that since he has been sick in February, he does require minimal assistance for iADL's only and reports wife providing transportation and assisting as indicated; also has an adult son who lives next door and is supportive/ assists as indicated  -- family provides transportation for patient to all provider appointments, errands, etc -- SDOH completed for: depression/ transportation/ food insecurity: no concerns identified  Advanced Directive (AD) Planning:   --reports does not currently have exisisting AD in place; declines offer for educational material, stating he already has from his multiple recent hospital visits.  Basics of Advanced Directive Planning discussed with patient    Self-health management of DM: -- has had DM-II since age 78- "about 9 years" -- discussed baseline blood sugars prior to his illness; patient reports and acknowledges that since he is on prednisone therapy post-hospitalizations, his blood sugar has accordingly increased -- continues monitoring/ recording blood sugars TID; confirms that he has obtained the CGM and short-acting oinsulin prescribed at time of recent PCP office visit 05/03/19 -- encouraged to continue monitoring and recording blood sugars; patient agreeable  Patient  denies further issues, concerns, or problems today.  I provided/  confirmed that patient has my direct phone number, the main [facility] CM office phone number, and the [facility] CM 24-hour nurse advice phone number should issues arise prior to next scheduled [facility] CM outreach in 2 weeks, after upcoming scheduled in-person PCP office visit.  Encouraged patient to contact me directly if needs, questions, issues, or concerns arise prior to next scheduled outreach; patient agreed to do so.  Plan:  Patient will take medications as prescribed and will attend all scheduled provider appointments  Patient will promptly notify care providers for any new concerns/ issues/ problems that arise  Patient will continue monitoring/ recording blood sugars TID   I will make patient's PCP aware of [facility] RN CM involvement in patient's care-- will send barriers letter  [facility] outreach to continue with scheduled phone call in 2 weeks post-upcoming scheduled PCP office visit  [facility] CM Care Plan Problem One     Most Recent Value  Care Plan Problem One  High risk for hospital readmission related to/ as evidenced by multiple recent inpatient hospitalizations and ER visits due to complications from COVID-19  Role Documenting the Problem One  Care Management Coordinator  Care Plan for Problem One  Active  [facility] Long Term Goal   Over the next 31 days, patient will not experience hospital re-admission as evidenced by patient reporting and review of EMR during [facility] RN CM outreach  [facility] Long Term Goal Start Date  05/05/19  Interventions for Problem One Long Term Goal  Discussed with patient his current clinical condition and reviewed most recent post-hospital discharge instructions with patient,  confirmed that patient does not have any concerns around medications and reports has and is taking medications as prescribed,  introduced patient to [facility] CM program and initiated ongoing [facility] CM program  [facility] CM Short Term Goal #1    Over the next 30 days, patient will attend all scheduled provider appointments, as evidenced by patient reporting and collaboration with care providers as indicated during [facility] RN CM outreach  [facility] CM Short Term Goal #1 Start Date  05/05/19  Interventions for Short Term Goal #1  Reviewed with patient all recent and upcoming provider appointments,  confirmed that patient has accurate understanding of all scheduled provider appointments and plans to attend all as scheduled,  confirmed that patient has no transportation concerns  [facility] CM Short Term Goal #2   Over the next 30 days, patient will begin using his newly prescribed CGM and will continue monitoring and recording blood sugars at home three times per day, as evidenced by patient reporting and review of same during [facility] RN CM outreach  [facility] CM Short Term Goal #2 Start Date  05/05/19     I appreciate the opportunity to participate in Mohsen's care,  Caryl Pina, RN, BSN, [employer] Coordinator [facility] Care Management  [phone]

## 2019-05-07 ENCOUNTER — Telehealth: Payer: Self-pay | Admitting: *Deleted

## 2019-05-07 NOTE — Telephone Encounter (Signed)
Kenneth Balo, DO  [name], [name], [facility]  Needs ED f/u. I just talked to him Monday for video visit. Now had chest pain

## 2019-05-07 NOTE — Telephone Encounter (Signed)
Appointment scheduled with Dr. Renato Gails for Monday 05/10/19.

## 2019-05-10 ENCOUNTER — Other Ambulatory Visit: Payer: Self-pay

## 2019-05-10 ENCOUNTER — Telehealth: Payer: Self-pay

## 2019-05-10 ENCOUNTER — Ambulatory Visit (INDEPENDENT_AMBULATORY_CARE_PROVIDER_SITE_OTHER): Payer: Medicare Other | Admitting: Internal Medicine

## 2019-05-10 ENCOUNTER — Encounter: Payer: Self-pay | Admitting: Internal Medicine

## 2019-05-10 VITALS — BP 130/78 | HR 86 | Temp 97.5°F | Ht 68.0 in | Wt 174.0 lb

## 2019-05-10 DIAGNOSIS — E162 Hypoglycemia, unspecified: Secondary | ICD-10-CM | POA: Diagnosis not present

## 2019-05-10 DIAGNOSIS — E1159 Type 2 diabetes mellitus with other circulatory complications: Secondary | ICD-10-CM | POA: Diagnosis not present

## 2019-05-10 DIAGNOSIS — E1165 Type 2 diabetes mellitus with hyperglycemia: Secondary | ICD-10-CM

## 2019-05-10 DIAGNOSIS — K219 Gastro-esophageal reflux disease without esophagitis: Secondary | ICD-10-CM

## 2019-05-10 DIAGNOSIS — IMO0002 Reserved for concepts with insufficient information to code with codable children: Secondary | ICD-10-CM

## 2019-05-10 DIAGNOSIS — Z794 Long term (current) use of insulin: Secondary | ICD-10-CM

## 2019-05-10 DIAGNOSIS — I25118 Atherosclerotic heart disease of native coronary artery with other forms of angina pectoris: Secondary | ICD-10-CM | POA: Diagnosis not present

## 2019-05-10 MED ORDER — FAMOTIDINE 20 MG PO TABS: 20.0000 mg | ORAL_TABLET | Freq: Every day | ORAL | 1 refills | Status: DC

## 2019-05-10 MED ORDER — GLUCOSE 40 % PO GEL: 1.0000 | Freq: Once | ORAL | 5 refills | Status: DC | PRN

## 2019-05-10 NOTE — Telephone Encounter (Signed)
I would hold off for 2 weeks until all his symptoms are resolved. It is okay to miss one dose

## 2019-05-10 NOTE — Patient Instructions (Addendum)
Please try using your tessalon twice a day routinely and as needed to prevent the aggressive coughing.  Take your metformin as directed 1000mg  twice daily WITH your meals.  I recommend using the basaglar regularly 40 units.    Don't use novolog unless your sugars are over 200.  It can cause hypoglycemia.    Resume the pepcid once a day.

## 2019-05-10 NOTE — Progress Notes (Signed)
Location:  [facility] clinic  Provider: Dr. Bufford Spikes  Goals of Care:  Advanced Directives 05/05/2019  Does Patient Have a Medical Advance Directive? No  Does patient want to make changes to medical advance directive? No - Patient declined  Would patient like information on creating a medical advance directive? No - Patient declined  Pre-existing out of facility DNR order (yellow form or pink MOST form) -     No chief complaint on file.   HPI: Patient is a 69 y.o. male seen today for medical management of chronic diseases.    Wife present for visit today.   He continues to struggle with ongoing symptoms of having Covid-19. He was diagnosed with Covid-pneumonia in February 2021. Since recovery, he has visited the [facility] ED for dehydration and chest pain. Last visit was 05/04/19 with chest pain. Troponin was negative and EKG interpretation was sinus rhythm with left ventriclar hypertrophy. Since ED visit, patient denies any incidents of chest pain.   He denies any heartburn. Still taking protonix daily. Stopped pepcid awhile back. Avoids trigger foods.   Cough related to Covid-19 is ongoing. He has aggressive episodes of coughing and he cannot stop. No sputum production. When these spells occur he will take a tessaoln pearls and use his albuterol inhaler. He is able to sleep at night. He denies taking the tessaol pearls or albuterol daily.    Since starting Novolog insulin, he is averaging about 8 units a meal. Sugars averaging 133-300. He believes his sugars are high due to prednisone. Takes 40 units of long acting at night. If his sugar is below 180, he will reduce his long-acting insulin to 20 units. He has had a few episodes of hypoglycemia. Asking for glucose tabs. Only taking metformin 500 once daily.   Still taking prednisone. He has five more days until medication complete.     Past Medical History:  Diagnosis Date  . Arthritis    "left thumb" (05/23/2017)  . Barrett's  esophagus    "we've been told that it's all gone; still take RX for GERD" (05/23/2017)  . CAD (coronary artery disease), native coronary artery    Coronary angiogram 05/03/2014:  Proximal LAD 3.0x12 mm Promus premier stent.  04/08/2014: Mid Cx 3.5 x 16 mm Promus DES.  03/29/2013: Mid LAD 2.75 x 38 mm Promus Premier drug-eluting stent, balloon angioplasty of D1 and distal LAD and stenting of distal RCA with 2.75 x 24 mm promos Premier drug-eluting stent 12/15/2012 widely patent.  . Cervicalgia   . Chronic bronchitis (HCC)    "q yr" (05/23/2017)  . Complication of anesthesia    " I do not wake up very well "  . COVID-19   . Family history of adverse reaction to anesthesia    "son w/PONV"  . GERD (gastroesophageal reflux disease)   . Heart murmur    "grew out of it"   . Hyperlipidemia   . Hypoglycemia, unspecified   . IDDM (insulin dependent diabetes mellitus)   . Impotence of organic origin   . Iron deficiency anemia   . Other malaise and fatigue   . PONV (postoperative nausea and vomiting)   . Restless leg   . Tension headache    "sometimes" (05/23/2017)  . Unspecified gastritis and gastroduodenitis with hemorrhage   . Unstable angina pectoris (HCC) 04/08/2014   Coronary angiogram 05/03/2014:  Proximal LAD 3.0x12 mm Promus premier stent.  04/08/2014: Mid Cx 3.5 x 16 mm Promus DES.  03/29/2013: Mid LAD 2.75  x 38 mm Promus Premier drug-eluting stent, balloon angioplasty of D1 and distal LAD and stenting of distal RCA with 2.75 x 24 mm promos Premier drug-eluting stent 12/15/2012 widely patent.    Past Surgical History:  Procedure Laterality Date  . CARDIAC CATHETERIZATION N/A 09/25/2015   Procedure: Left Heart Cath and Coronary Angiography;  Surgeon: [name], MD;  Location: [facility] INVASIVE CV LAB;  Service: Cardiovascular;  Laterality: N/A;  . CARDIAC CATHETERIZATION N/A 09/25/2015   Procedure: Intravascular Pressure Wire/FFR Study;  Surgeon: [name], MD;  Location: [facility] INVASIVE CV LAB;  Service:  Cardiovascular;  Laterality: N/A;  . CORONARY ANGIOPLASTY WITH STENT PLACEMENT  12/15/2012; 04/28/2014; 05/03/2014   "2; 1; 1"   . FRACTIONAL FLOW RESERVE WIRE Right 03/26/2013   Procedure: FRACTIONAL FLOW RESERVE WIRE;  Surgeon: Pamella Pert, MD;  Location: [facility] CATH LAB;  Service: Cardiovascular;  Laterality: Right;  . FRACTIONAL FLOW RESERVE WIRE N/A 05/03/2014   Procedure: FRACTIONAL FLOW RESERVE WIRE;  Surgeon: Pamella Pert, MD;  Location: [facility] CATH LAB;  Service: Cardiovascular;  Laterality: N/A;  . HERNIA REPAIR  2010   "umbilical"   . INCISION AND DRAINAGE ABSCESS Left 05/2007   "groin"   . KNEE SURGERY Right 1992;  2000   "calcium deposits removed" (12/15/2012)  . LEFT HEART CATH AND CORONARY ANGIOGRAPHY N/A 05/23/2017   Procedure: LEFT HEART CATH AND CORONARY ANGIOGRAPHY;  Surgeon: [name], MD;  Location: [facility] INVASIVE CV LAB;  Service: Cardiovascular;  Laterality: N/A;  . LEFT HEART CATHETERIZATION WITH CORONARY ANGIOGRAM N/A 12/15/2012   Procedure: LEFT HEART CATHETERIZATION WITH CORONARY ANGIOGRAM;  Surgeon: Pamella Pert, MD;  Location: [facility] CATH LAB;  Service: Cardiovascular;  Laterality: N/A;  . LEFT HEART CATHETERIZATION WITH CORONARY ANGIOGRAM N/A 03/26/2013   Procedure: LEFT HEART CATHETERIZATION WITH CORONARY ANGIOGRAM;  Surgeon: Pamella Pert, MD;  Location: [facility] CATH LAB;  Service: Cardiovascular;  Laterality: N/A;  . LEFT HEART CATHETERIZATION WITH CORONARY ANGIOGRAM N/A 04/08/2014   Procedure: LEFT HEART CATHETERIZATION WITH CORONARY ANGIOGRAM;  Surgeon: Micheline Chapman, MD;  Location: [facility] CATH LAB;  Service: Cardiovascular;  Laterality: N/A;  . PERCUTANEOUS CORONARY STENT INTERVENTION (PCI-S) N/A 05/03/2014   Procedure: PERCUTANEOUS CORONARY STENT INTERVENTION (PCI-S);  Surgeon: Pamella Pert, MD;  Location: [facility] CATH LAB;  Service: Cardiovascular;  Laterality: N/A;  . UMBILICAL HERNIA REPAIR      Allergies  Allergen Reactions  . Lyrica [Pregabalin] Other (See  Comments)    Made patient very lethargic the next morning, very hard to patient to function, move, etc.   . Statins Other (See Comments)    Causes Restless Legs, rhabdomyolysis  . Metformin And Related     GI upset  . Trulicity [Dulaglutide] Other (See Comments)    GI side effects     Outpatient Encounter Medications as of 05/10/2019  Medication Sig  . acetaminophen (TYLENOL) 500 MG tablet Take 500-1,000 mg by mouth every 6 (six) hours as needed (for pain/headaches.).  Marland Kitchen albuterol (PROVENTIL HFA;VENTOLIN HFA) 108 (90 Base) MCG/ACT inhaler Inhale 2 puffs into the lungs every 6 (six) hours as needed for wheezing or shortness of breath.  . Alirocumab (PRALUENT) 75 MG/ML SOAJ Inject 75 mg into the skin every 14 (fourteen) days.  Marland Kitchen aspirin 81 MG EC tablet Take 1 tablet (81 mg total) by mouth daily.  . BD PEN NEEDLE NANO U/F 32G X 4 MM MISC USE TWICE DAILY WITH LANTUS PEN  . benzonatate (TESSALON) 200 MG capsule Take 1 capsule (  200 mg total) by mouth 3 (three) times daily as needed for cough.  . carvedilol (COREG) 3.125 MG tablet Take 1 tablet (3.125 mg total) by mouth 2 (two) times daily with a meal.  . Continuous Blood Gluc Receiver (FREESTYLE LIBRE 14 DAY READER) DEVI Inject 1 Device as directed daily as needed. Use once daily as direct to check blood sugar E11.51  . Continuous Blood Gluc Sensor (FREESTYLE LIBRE 14 DAY SENSOR) MISC Use as directed to check blood sugar as needed. E11.51  . [employer] VITAMIN C 500 MG tablet TAKE 1 TABLET BY MOUTH TWICE DAILY  . D-5000 125 MCG (5000 UT) TABS TAKE 1 TABLET BY MOUTH EVERY DAY  . fluticasone (FLONASE) 50 MCG/ACT nasal spray Place 1 spray into both nostrils daily as needed for allergies or rhinitis.  Marland Kitchen gabapentin (NEURONTIN) 100 MG capsule Take 300-400 mg by mouth at bedtime.   . insulin aspart (NOVOLOG FLEXPEN) 100 UNIT/ML FlexPen Based on carbohydrate intake  . Insulin Glargine (BASAGLAR KWIKPEN) 100 UNIT/ML SOPN INJECT 40 UNITS SUBCUTANEOUSLY INTO THE  SKIN AT BEDTIME  . LIFESCAN FINEPOINT LANCETS MISC Use to test for blood sugar three times daily dx: E11.51  . metFORMIN (GLUCOPHAGE) 1000 MG tablet Take 1 tablet (1,000 mg total) by mouth 2 (two) times daily with a meal.  . nitroGLYCERIN (NITROSTAT) 0.4 MG SL tablet PLACE 1 TABLET (0.4 MG TOTAL) UNDER THE TONGUE EVERY 5 (FIVE) MINUTES AS NEEDED FOR CHEST PAIN.  Marland Kitchen ondansetron (ZOFRAN) 4 MG tablet Take 1 tablet (4 mg total) by mouth every 8 (eight) hours as needed for nausea or vomiting.  Letta Pate ULTRA test strip USE TO TEST FOR BLOOD SUGAR THREE TIMES DAILY DX: E11.51  . pantoprazole (PROTONIX) 40 MG tablet Take 40 mg by mouth daily.  . pramipexole (MIRAPEX) 1 MG tablet TAKE 2 TABLETS (2 MG TOTAL) BY MOUTH AT BEDTIME. DX G25.81  . pramipexole (MIRAPEX) 1 MG tablet Take 1 mg by mouth every evening.  . predniSONE (DELTASONE) 10 MG tablet Take 10 mg by mouth daily with breakfast. 4 Tablets for 3 days. 3 tablets, days, 2 tabs for days and 1 tablet for days until he winged off.  . valsartan (DIOVAN) 80 MG tablet Take 1 tablet (80 mg total) by mouth daily.   No facility-administered encounter medications on file as of 05/10/2019.    Review of Systems:  Review of Systems  Constitutional: Positive for fatigue. Negative for activity change and appetite change.  Respiratory: Positive for cough and chest tightness. Negative for shortness of breath and wheezing.   Cardiovascular: Positive for chest pain and leg swelling. Negative for palpitations.  Endocrine: Negative for polydipsia, polyphagia and polyuria.  Skin: Negative.   Neurological: Positive for weakness. Negative for dizziness and light-headedness.  Psychiatric/Behavioral: Negative for dysphoric mood and sleep disturbance. The patient is not nervous/anxious.     Health Maintenance  Topic Date Due  . OPHTHALMOLOGY EXAM  03/20/2019  . INFLUENZA VACCINE  10/04/2019 (Originally 10/03/2018)  . PNA vac Low Risk Adult (2 of 2 - PPSV23) 03/14/2020  (Originally 12/02/2014)  . HEMOGLOBIN A1C  09/06/2019  . FOOT EXAM  03/14/2020  . COLONOSCOPY  10/18/2025  . TETANUS/TDAP  03/02/2027  . Hepatitis C Screening  Completed    Physical Exam: There were no vitals filed for this visit. There is no height or weight on file to calculate BMI. Physical Exam Vitals reviewed.  Constitutional:      Appearance: Normal appearance.  HENT:  Mouth/Throat:     Mouth: Mucous membranes are moist.     Pharynx: No oropharyngeal exudate or posterior oropharyngeal erythema.  Cardiovascular:     Rate and Rhythm: Normal rate and regular rhythm.     Pulses: Normal pulses.     Heart sounds: No murmur.  Pulmonary:     Effort: Pulmonary effort is normal. No respiratory distress.     Breath sounds: Normal breath sounds. No wheezing.  Abdominal:     General: Abdomen is flat. Bowel sounds are normal.     Palpations: Abdomen is soft.  Musculoskeletal:     Right lower leg: Edema present.     Left lower leg: Edema present.  Skin:    General: Skin is warm and dry.     Capillary Refill: Capillary refill takes less than 2 seconds.  Neurological:     Mental Status: He is alert.  Psychiatric:        Mood and Affect: Mood normal.        Behavior: Behavior normal.        Thought Content: Thought content normal.        Judgment: Judgment normal.     Labs reviewed: Basic Metabolic Panel: Recent Labs    04/26/19 0530 04/27/19 0237 04/27/19 0337 04/28/19 0332 04/29/19 0134 04/30/19 0200 05/01/19 0344 05/04/19 1022  NA 135   < >  --  136   < > 136 136 135  K 4.0   < >  --  4.6   < > 5.0 4.9 4.2  CL 100   < >  --  102   < > 103 100 99  CO2 28   < >  --  24   < > 24 25 25   GLUCOSE 84   < >  --  151*   < > 247* 201* 152*  BUN 21   < >  --  22   < > 27* 26* 30*  CREATININE 1.28*   < >  --  1.07   < > 0.98 0.93 1.23  CALCIUM 7.9*   < >  --  8.2*   < > 8.2* 8.4* 8.3*  MG 1.9  --  1.7 2.1  --   --   --   --   PHOS 3.2  --  2.4* 4.1  --   --   --   --      < > = values in this interval not displayed.   Liver Function Tests: Recent Labs    04/29/19 0134 04/30/19 0200 05/01/19 0344  AST 16 14* 17  ALT 23 21 25   ALKPHOS 61 68 61  BILITOT 0.7 0.7 0.5  PROT 5.7* 5.7* 5.6*  ALBUMIN 2.3* 2.3* 2.5*   No results for input(s): LIPASE, AMYLASE in the last 8760 hours. No results for input(s): AMMONIA in the last 8760 hours. CBC: Recent Labs    04/29/19 0134 04/29/19 0134 04/30/19 0200 05/01/19 0344 05/04/19 1022  WBC 8.8   < > 8.5 7.8 9.0  NEUTROABS 8.0*  --  7.8* 6.7  --   HGB 12.6*   < > 12.7* 12.5* 13.3  HCT 38.5*   < > 37.5* 37.1* 40.7  MCV 90.4   < > 89.1 88.8 91.9  PLT 76*   < > 94* 109* 134*   < > = values in this interval not displayed.   Lipid Panel: Recent Labs    03/09/19 0832 04/14/19 0540  CHOL 166  --  HDL 37*  --   LDLCALC 103*  --   TRIG 159* 41  CHOLHDL 4.5  --    Lab Results  Component Value Date   HGBA1C 9.2 (H) 03/09/2019    Procedures since last visit: CT Angio Chest PE W/Cm &/Or Wo Cm  Result Date: 04/25/2019 CLINICAL DATA:  Chest pain and shortness of breath. COVID positive, recent hospital discharge. EXAM: CT ANGIOGRAPHY CHEST WITH CONTRAST TECHNIQUE: Multidetector CT imaging of the chest was performed using the standard protocol during bolus administration of intravenous contrast. Multiplanar CT image reconstructions and MIPs were obtained to evaluate the vascular anatomy. CONTRAST:  69mL OMNIPAQUE IOHEXOL 350 MG/ML SOLN COMPARISON:  Radiograph yesterday. Radiograph 04/14/2019 FINDINGS: Cardiovascular: There are no filling defects within the pulmonary arteries to suggest pulmonary embolus. Evaluation of the subsegmental branches is limited at the lung bases due to motion. Heart is normal in size. Coronary artery calcifications/stents. The thoracic aorta is normal in caliber. No evidence of aortic dissection. Mediastinum/Nodes: Small mediastinal and bilateral hilar lymph nodes, not enlarged by size  criteria. No esophageal wall thickening. No suspicious thyroid nodule. Lungs/Pleura: Multifocal linear, ground-glass, and consolidative opacities throughout both lungs. There is peripheral predominant distribution. Trace right pleural effusion. No pneumothorax. The trachea and mainstem bronchi are patent. Upper Abdomen: No acute findings. Musculoskeletal: There are no acute or suspicious osseous abnormalities. Review of the MIP images confirms the above findings. IMPRESSION: 1. No pulmonary embolus. 2. Multifocal linear, ground-glass, and consolidative opacities throughout both lungs. Findings are consistent with patient's history of COVID-19 pneumonia. Parenchymal involvement is moderate. Trace right pleural effusion. 3. Coronary artery calcifications/stents. Electronically Signed   By: Keith Rake M.D.   On: 04/25/2019 03:21   DG Chest Port 1 View  Result Date: 05/04/2019 CLINICAL DATA:  69 year old male with history of shortness of breath today. History of COVID-19 diagnosed January 10th. EXAM: PORTABLE CHEST 1 VIEW COMPARISON:  Chest x-ray 04/26/2019. FINDINGS: Lung volumes are low. Patchy ill-defined opacities and areas of interstitial prominence are noted throughout the lungs bilaterally (left greater than right), most severe throughout the left mid to lower lung. Aeration has minimally improved compared to the prior examination. No new areas of airspace consolidation. No pleural effusions. No evidence of pulmonary edema. Heart size is normal. Upper mediastinal contours are within normal limits. IMPRESSION: 1. The appearance of the lungs is compatible with resolving multilobar bilateral pneumonia (left greater than right) with probable developing post infectious or inflammatory scarring. Electronically Signed   By: Vinnie Langton M.D.   On: 05/04/2019 10:55   DG Chest Portable 1 View  Result Date: 04/26/2019 CLINICAL DATA:  Discharge this morning, previously admitted for COVID-19 04/09/2019 EXAM:  PORTABLE CHEST 1 VIEW COMPARISON:  Radiograph 04/24/2019, CT 04/25/2019 FINDINGS: Bilateral mixed reticular and consolidative airspace opacities are again noted, increased from comparison radiograph 04/24/2019 and similar to CT performed earlier the same day. No pneumothorax. No effusion. The cardiomediastinal contours are unremarkable. No acute osseous or soft tissue abnormality. Degenerative changes are present in the imaged spine and shoulders. Telemetry leads overlie the chest. IMPRESSION: Bilateral mixed reticular and consolidative airspace opacities, compatible with pneumonia, increasing from 1 day prior. Electronically Signed   By: Lovena Le M.D.   On: 04/26/2019 00:19   DG Chest Portable 1 View  Result Date: 04/25/2019 CLINICAL DATA:  Left-sided chest pain over the last hour. Coronavirus infection. EXAM: PORTABLE CHEST 1 VIEW COMPARISON:  04/14/2019 FINDINGS: Heart size is normal. Coronary artery stents are visible. Widespread patchy  pulmonary infiltrates again evident affecting both lungs, left worse than right. There is been some clearing at the left base. No worsening or new finding. No effusion. No bone abnormality IMPRESSION: Patchy bilateral pulmonary infiltrates left more than right, but with some clearing at the left base since study of 10 days ago. Electronically Signed   By: Paulina Fusi M.D.   On: 04/25/2019 00:24   DG Chest Portable 1 View  Result Date: 04/14/2019 CLINICAL DATA:  Chest tightness, COVID-19 positive EXAM: PORTABLE CHEST 1 VIEW COMPARISON:  Radiograph 09/18/2015 FINDINGS: Multifocal min areas of patchy airspace opacity present throughout both lungs. No pneumothorax or effusion. The cardiomediastinal contours are unremarkable. No acute osseous or soft tissue abnormality. Mild degenerative changes in the shoulders. Cardiac monitoring leads overlie the chest. IMPRESSION: Multifocal airspace disease compatible with atypical pneumonia in the setting of COVID-19 positivity.  Electronically Signed   By: Kreg Shropshire M.D.   On: 04/14/2019 05:13    Assessment/Plan 1. Uncontrolled diabetes mellitus with circulatory complication, with long-term current use of insulin - sugars have varied due to prednisone and inconsistent use of diabetic medication - recommend taking metformin 1000 mg with meals twice a day , as prescribed - recommend using basaglar 40 units every night - recommend not using novolog insulin unless sugars are over 200 - hemoglobin A1C- future - complete metabolic panel with GFR- future - continue diet that limits carbohydrates and sugar  2. Hypoglycemia - has a history of hypoglycemic events in the past - will order dextrose 37.5 g oral PRN  3. Gastroesophageal refulx disease without esophagitis - suspect chest pain is associated with heartburn and history of Barretts esophagus - continue protonix as prescribed - restart famotidine 20 mg oral daily     Labs/tests ordered: complete metabolic panel with GFR, hemoglobin A1C- future Next appt:  05/17/2019

## 2019-05-11 ENCOUNTER — Other Ambulatory Visit: Payer: Self-pay | Admitting: Nurse Practitioner

## 2019-05-11 ENCOUNTER — Other Ambulatory Visit: Payer: Self-pay | Admitting: Internal Medicine

## 2019-05-11 DIAGNOSIS — IMO0002 Reserved for concepts with insufficient information to code with codable children: Secondary | ICD-10-CM

## 2019-05-11 DIAGNOSIS — E1159 Type 2 diabetes mellitus with other circulatory complications: Secondary | ICD-10-CM

## 2019-05-11 NOTE — Telephone Encounter (Signed)
rx sent to pharmacy by e-script  

## 2019-05-13 ENCOUNTER — Encounter: Payer: Self-pay | Admitting: Cardiology

## 2019-05-13 ENCOUNTER — Telehealth: Payer: Medicare Other | Admitting: Cardiology

## 2019-05-13 VITALS — BP 117/60 | HR 67 | Ht 68.0 in | Wt 173.0 lb

## 2019-05-13 DIAGNOSIS — R0789 Other chest pain: Secondary | ICD-10-CM | POA: Diagnosis not present

## 2019-05-13 DIAGNOSIS — I25118 Atherosclerotic heart disease of native coronary artery with other forms of angina pectoris: Secondary | ICD-10-CM

## 2019-05-13 DIAGNOSIS — I10 Essential (primary) hypertension: Secondary | ICD-10-CM

## 2019-05-13 DIAGNOSIS — E7800 Pure hypercholesterolemia, unspecified: Secondary | ICD-10-CM

## 2019-05-13 DIAGNOSIS — U071 COVID-19: Secondary | ICD-10-CM

## 2019-05-13 MED ORDER — CLOPIDOGREL BISULFATE 75 MG PO TABS: 75.0000 mg | ORAL_TABLET | Freq: Every day | ORAL | 0 refills | Status: DC

## 2019-05-13 NOTE — Progress Notes (Signed)
Virtual Visit via Video Note: This visit type was conducted due to national recommendations for restrictions regarding the COVID-19 Pandemic (e.g. social distancing).  This format is felt to be most appropriate for this patient at this time.  All issues noted in this document were discussed and addressed.  No physical exam was performed (except for noted visual exam findings with Telehealth visits).  The patient has consented to conduct a Telehealth visit and understands insurance will be billed.   I connected with@, on 05/13/19 at  by a video enabled telemedicine application and verified that I am speaking with the correct person using two identifiers.   I discussed the limitations of evaluation and management by telemedicine and the availability of in person appointments. The patient expressed understanding and agreed to proceed.   I have discussed with patient regarding the safety during COVID Pandemic and steps and precautions to be taken including social distancing, frequent hand wash and use of detergent soap, gels with the patient. I asked the patient to avoid touching mouth, nose, eyes, ears with the hands. I encouraged regular walking around the neighborhood and exercise and regular diet, as long as social distancing can be maintained.   Primary Physician/Referring:  Gayland Curry, DO  Patient ID: Kenneth Lav Sr., male    DOB: [birth year], 69 y.o.   MRN: [id number]  Chief Complaint  Patient presents with  . Coronary Artery Disease  . Transitions Of Care    Positive for COVID-19  . Chest Pain   HPI:    Kenneth Hammond Sr.  is a 69 y.o. caucasian male with coronary artery disease, last cardiac catheterization on 05/29/2017 when he presented with unstable anginal-like symptoms revealing widely patent stent to his LAD, RCA and circumflex and also balloon angioplasty site to D1 and distal LAD.  Fortunately his last PCI has been in 2016.  Past medical history significant for hypertension,  hyperlipidemia, GERD, Barrett's esophagus, statin intolerance, uncontrolled diabetes mellitus and also severe restless leg syndrome.  He was admitted to the hospital after Covid 19 infection on 04/09/2019 and was again readmitted on 05/01/2019 and received remdesivir and also Decadron.  He has had 2 ED visits between the 2 admissions and latest admission and evaluation in the ED for chest pain on 05/04/2019.  He also had acute kidney injury during hospitalization which is improving.  This is a virtual visit to follow-up on the chest pain.  He is presently feeling better, but feels extremely fatigued and tired since Covid infection.  Denies PND orthopnea or leg edema.  He is tolerating all his medications.  Past Medical History:  Diagnosis Date  . Arthritis    "left thumb" (05/23/2017)  . Barrett's esophagus    "we've been told that it's all gone; still take RX for GERD" (05/23/2017)  . CAD (coronary artery disease), native coronary artery    Coronary angiogram 05/03/2014:  Proximal LAD 3.0x12 mm Promus premier stent.  04/08/2014: Mid Cx 3.5 x 16 mm Promus DES.  03/29/2013: Mid LAD 2.75 x 38 mm Promus Premier drug-eluting stent, balloon angioplasty of D1 and distal LAD and stenting of distal RCA with 2.75 x 24 mm promos Premier drug-eluting stent 12/15/2012 widely patent.  . Cervicalgia   . Chronic bronchitis ([place])    "q yr" (05/23/2017)  . Complication of anesthesia    " I do not wake up very well "  . COVID-19   . Family history of adverse reaction to anesthesia    "son w/PONV"  .  GERD (gastroesophageal reflux disease)   . Heart murmur    "grew out of it"   . Hyperlipidemia   . Hypoglycemia, unspecified   . IDDM (insulin dependent diabetes mellitus)   . Impotence of organic origin   . Iron deficiency anemia   . Other malaise and fatigue   . PONV (postoperative nausea and vomiting)   . Restless leg   . Tension headache    "sometimes" (05/23/2017)  . Unspecified gastritis and  gastroduodenitis with hemorrhage   . Unstable angina pectoris (HCC) 04/08/2014   Coronary angiogram 05/03/2014:  Proximal LAD 3.0x12 mm Promus premier stent.  04/08/2014: Mid Cx 3.5 x 16 mm Promus DES.  03/29/2013: Mid LAD 2.75 x 38 mm Promus Premier drug-eluting stent, balloon angioplasty of D1 and distal LAD and stenting of distal RCA with 2.75 x 24 mm promos Premier drug-eluting stent 12/15/2012 widely patent.   Past Surgical History:  Procedure Laterality Date  . CARDIAC CATHETERIZATION N/A 09/25/2015   Procedure: Left Heart Cath and Coronary Angiography;  Surgeon: [name], MD;  Location: [facility] INVASIVE CV LAB;  Service: Cardiovascular;  Laterality: N/A;  . CARDIAC CATHETERIZATION N/A 09/25/2015   Procedure: Intravascular Pressure Wire/FFR Study;  Surgeon: [name], MD;  Location: [facility] INVASIVE CV LAB;  Service: Cardiovascular;  Laterality: N/A;  . CORONARY ANGIOPLASTY WITH STENT PLACEMENT  12/15/2012; 04/28/2014; 05/03/2014   "2; 1; 1"   . FRACTIONAL FLOW RESERVE WIRE Right 03/26/2013   Procedure: FRACTIONAL FLOW RESERVE WIRE;  Surgeon: Pamella Pert, MD;  Location: [facility] CATH LAB;  Service: Cardiovascular;  Laterality: Right;  . FRACTIONAL FLOW RESERVE WIRE N/A 05/03/2014   Procedure: FRACTIONAL FLOW RESERVE WIRE;  Surgeon: Pamella Pert, MD;  Location: [facility] CATH LAB;  Service: Cardiovascular;  Laterality: N/A;  . HERNIA REPAIR  2010   "umbilical"   . INCISION AND DRAINAGE ABSCESS Left 05/2007   "groin"   . KNEE SURGERY Right 1992;  2000   "calcium deposits removed" (12/15/2012)  . LEFT HEART CATH AND CORONARY ANGIOGRAPHY N/A 05/23/2017   Procedure: LEFT HEART CATH AND CORONARY ANGIOGRAPHY;  Surgeon: [name], MD;  Location: [facility] INVASIVE CV LAB;  Service: Cardiovascular;  Laterality: N/A;  . LEFT HEART CATHETERIZATION WITH CORONARY ANGIOGRAM N/A 12/15/2012   Procedure: LEFT HEART CATHETERIZATION WITH CORONARY ANGIOGRAM;  Surgeon: Pamella Pert, MD;  Location: [facility] CATH LAB;  Service:  Cardiovascular;  Laterality: N/A;  . LEFT HEART CATHETERIZATION WITH CORONARY ANGIOGRAM N/A 03/26/2013   Procedure: LEFT HEART CATHETERIZATION WITH CORONARY ANGIOGRAM;  Surgeon: Pamella Pert, MD;  Location: [facility] CATH LAB;  Service: Cardiovascular;  Laterality: N/A;  . LEFT HEART CATHETERIZATION WITH CORONARY ANGIOGRAM N/A 04/08/2014   Procedure: LEFT HEART CATHETERIZATION WITH CORONARY ANGIOGRAM;  Surgeon: Micheline Chapman, MD;  Location: [facility] CATH LAB;  Service: Cardiovascular;  Laterality: N/A;  . PERCUTANEOUS CORONARY STENT INTERVENTION (PCI-S) N/A 05/03/2014   Procedure: PERCUTANEOUS CORONARY STENT INTERVENTION (PCI-S);  Surgeon: Pamella Pert, MD;  Location: [facility] CATH LAB;  Service: Cardiovascular;  Laterality: N/A;  . UMBILICAL HERNIA REPAIR     Social History   Tobacco Use  . Smoking status: Never Smoker  . Smokeless tobacco: Never Used  Substance Use Topics  . Alcohol use: No     ROS  Review of Systems  Musculoskeletal: Positive for muscle cramps (restless leg).   Objective   Vitals with BMI 05/13/2019 05/10/2019 05/04/2019  Height 5\' 8"  5\' 8"  -  Weight 173 lbs 174 lbs -  BMI 26.31  26.46 -  Systolic 117 130 109  Diastolic 60 78 65  Pulse 67 86 61    Blood pressure 117/60, pulse 67, height 5\' 8"  (1.727 m), weight 173 lb (78.5 kg). Body mass index is 26.3 kg/m.   Physical exam not performed or limited due to virtual visit.  Patient appeared to be in no distress, Neck was supple, respiration was not labored.  Please see exam details from prior visit is as below.  Physical Exam  Constitutional: He appears well-developed and well-nourished.  Cardiovascular: Normal rate, regular rhythm and normal heart sounds. Exam reveals no gallop.  No murmur heard. Pulses:      Carotid pulses are 2+ on the right side and 2+ on the left side.      Femoral pulses are 2+ on the right side and 2+ on the left side.      Dorsalis pedis pulses are 1+ on the right side and 1+ on the left side.        Posterior tibial pulses are 1+ on the right side and 1+ on the left side.  There is mild  pigmentation bilateral legs with no edema  There is no JVD.  Pulmonary/Chest: Effort normal and breath sounds normal.  Abdominal: Soft. Bowel sounds are normal.  Musculoskeletal:        General: Normal range of motion.   Radiology: No results found.  Laboratory examination:   Recent Labs    04/30/19 0200 05/01/19 0344 05/04/19 1022  NA 136 136 135  K 5.0 4.9 4.2  CL 103 100 99  CO2 24 25 25   GLUCOSE 247* 201* 152*  BUN 27* 26* 30*  CREATININE 0.98 0.93 1.23  CALCIUM 8.2* 8.4* 8.3*  GFRNONAA >60 >60 60*  GFRAA >60 >60 >60   CMP Latest Ref Rng & Units 05/04/2019 05/01/2019 04/30/2019  Glucose 70 - 99 mg/dL 05/03/2019) 05/02/2019) 323(F)  BUN 8 - 23 mg/dL 573(U) 202(R) 42(H)  Creatinine 0.61 - 1.24 mg/dL 06(C 37(S 2.83  Sodium 135 - 145 mmol/L 135 136 136  Potassium 3.5 - 5.1 mmol/L 4.2 4.9 5.0  Chloride 98 - 111 mmol/L 99 100 103  CO2 22 - 32 mmol/L 25 25 24   Calcium 8.9 - 10.3 mg/dL 8.3(L) 8.4(L) 8.2(L)  Total Protein 6.5 - 8.1 g/dL - 5.6(L) 5.7(L)  Total Bilirubin 0.3 - 1.2 mg/dL - 0.5 0.7  Alkaline Phos 38 - 126 U/L - 61 68  AST 15 - 41 U/L - 17 14(L)  ALT 0 - 44 U/L - 25 21   CBC Latest Ref Rng & Units 05/04/2019 05/01/2019 04/30/2019  WBC 4.0 - 10.5 K/uL 9.0 7.8 8.5  Hemoglobin 13.0 - 17.0 g/dL 07/04/2019 12.5(L) 12.7(L)  Hematocrit 39.0 - 52.0 % 40.7 37.1(L) 37.5(L)  Platelets 150 - 400 K/uL 134(L) 109(L) 94(L)   Lipid Panel     Component Value Date/Time   CHOL 166 03/09/2019 0832   CHOL 86 (L) 07/03/2015 0816   TRIG 41 04/14/2019 0540   HDL 37 (L) 03/09/2019 0832   HDL 37 (L) 07/03/2015 0816   CHOLHDL 4.5 03/09/2019 0832   VLDL 21 08/27/2016 0849   LDLCALC 103 (H) 03/09/2019 0832   HEMOGLOBIN A1C Lab Results  Component Value Date   HGBA1C 9.2 (H) 03/09/2019   MPG 217 03/09/2019   TSH No results for input(s): TSH in the last 8760 hours. Medications and allergies   Allergies    Allergen Reactions  . Lyrica [Pregabalin] Other (See Comments)  Made patient very lethargic the next morning, very hard to patient to function, move, etc.   . Statins Other (See Comments)    Causes Restless Legs, rhabdomyolysis  . Metformin And Related     GI upset  . Trulicity [Dulaglutide] Other (See Comments)    GI side effects     Current Outpatient Medications  Medication Instructions  . acetaminophen (TYLENOL) 500-1,000 mg, Oral, Every 6 hours PRN  . albuterol (PROVENTIL HFA;VENTOLIN HFA) 108 (90 Base) MCG/ACT inhaler 2 puffs, Inhalation, Every 6 hours PRN  . aspirin 81 mg, Oral, Daily  . BD PEN NEEDLE NANO U/F 32G X 4 MM MISC USE TWICE DAILY WITH LANTUS PEN  . benzonatate (TESSALON) 200 mg, Oral, 3 times daily PRN  . carvedilol (COREG) 3.125 mg, Oral, 2 times daily with meals  . clopidogrel (PLAVIX) 75 mg, Oral, Daily  . Continuous Blood Gluc Receiver (FREESTYLE LIBRE 14 DAY READER) DEVI 1 Device, Injection, Daily PRN, Use once daily as direct to check blood sugar E11.51  . Continuous Blood Gluc Sensor (FREESTYLE LIBRE 14 DAY SENSOR) MISC Use as directed to check blood sugar as needed. E11.51  . CVS VITAMIN C 500 MG tablet TAKE 1 TABLET BY MOUTH TWICE DAILY  . D-5000 125 MCG (5000 UT) TABS TAKE 1 TABLET BY MOUTH EVERY DAY  . dextrose (GLUTOSE) 37.5 g, Oral, Once PRN  . famotidine (PEPCID) 20 mg, Oral, Daily  . fluticasone (FLONASE) 50 MCG/ACT nasal spray 1 spray, Each Nare, Daily PRN  . gabapentin (NEURONTIN) 300-400 mg, Oral, Daily at bedtime  . insulin aspart (NOVOLOG FLEXPEN) 100 UNIT/ML FlexPen Based on carbohydrate intake  . Insulin Glargine (BASAGLAR KWIKPEN) 100 UNIT/ML INJECT 40 UNITS SUBCUTANEOUSLY INTO THE SKIN AT BEDTIME  . LIFESCAN FINEPOINT LANCETS MISC Use to test for blood sugar three times daily dx: E11.51   . metFORMIN (GLUCOPHAGE) 1000 MG tablet TAKE 1 TABLET BY MOUTH 2 TIMES DAILY WITH A MEAL  . nitroGLYCERIN (NITROSTAT) 0.4 mg, Sublingual, Every 5 min PRN   . ONETOUCH ULTRA test strip USE TO TEST FOR BLOOD SUGAR THREE TIMES DAILY DX: E11.51  . pantoprazole (PROTONIX) 40 mg, Oral, Daily  . Praluent 75 mg, Subcutaneous, Every 14 days  . pramipexole (MIRAPEX) 1 mg, Oral, 1 tablet in the morning and 1 tablet at bedtime  . predniSONE (DELTASONE) 10 mg, Oral, Daily with breakfast, 4 Tablets for 3 days. 3 tablets, days, 2 tabs for days and 1 tablet for days until he winged off.  . valsartan (DIOVAN) 80 mg, Oral, Daily    Cardiac Studies:   Coronary angiogram 09/25/2015: Ostial Cx 50-60%, FFR 0.86. LVEF 60%. Patent stent placed 05/03/2014: Proximal LAD 3.0x12 mm Promus DES. 04/08/2014: Cx/OM1 with 3.5 x 16 mm Promus DES. H/O Mid LAD 2.75 x 38 mm Promus Premier DES, balloon angioplasty of D1 and distal LAD(03/29/2013) and Distal RCA stent (12/15/2012) with 2.75 x 24 mm promos Premier DES.  Exercise myoview stress 08/28/2015: 1. The resting electrocardiogram demonstrated normal sinus rhythm, normal resting conduction, no resting arrhythmias and normal rest repolarization.  Stress EKG is equivocal for ischemia, there was 2 mm upsloping ST segment depression noted with peak exercise, reversed back to baseline at 2 minute recovery. The stress test was terminated because of fatigue. Patient exercised on Bruce protocol for 7:22 minutes and achieved 8.57 METS. Stress test terminated due to target heart rate( 94% MPHR).  2. Myocardial perfusion imaging is normal. Overall left ventricular systolic function was normal without regional wall motion  abnormalities. The left ventricular ejection fraction was 59%.  This is a low risk study. clinical correlation recommended.  Compared to the study done on 11/20/2012, stress induced chest pain not present, inferior wall ischemia not present.  Echocardiogram 08/31/2015: Left ventricle cavity is normal in size. Normal global wall motion. Doppler evidence of grade I (impaired) diastolic dysfunction. Calculated EF 58%. Left atrial  cavity is mildly dilated. Mild (Grade I) aortic regurgitation. Mild (Grade I) mitral regurgitation. Mild tricuspid regurgitation. No evidence of pulmonary hypertension. No significant change since 10/07/2013.  Lower extremity venous duplex for insufficiency 05/29/2017: Bilateral lower extremities negative for DVT or reflux.  Coronary angiogram 05/30/2019 and 05/23/2017: All stents placed previously are patent.  Proximal to mid LAD long stent (Overlaps prior stent) that was placed on 04/08/2014, circumflex/obtuse marginal-1 stenting. History of mid LAD stenting and balloon angioplasty to D1 and distal LAD performed on 03/29/2013, distal RCA stent in 2014. No change in ostial circumflex lesion, the mid LAD stent is widely patent however at the outflow, there is a 30-40% stenosis.  EKG:  EKG 05/04/2019: Normal sinus rhythm with rate of 63 bpm, left axis deviation, borderline criteria for LVH.  No evidence of ischemia.  Compared to 12/24/2018, inferior T wave abnormality not present.  Assessment     ICD-10-CM   1. Coronary artery disease of native artery of native heart with stable angina pectoris (HCC)  I25.118 PCV ECHOCARDIOGRAM COMPLETE    PCV MYOCARDIAL PERFUSION WITH LEXISCAN    clopidogrel (PLAVIX) 75 MG tablet    CBC  2. Essential hypertension  I10 COMPLETE METABOLIC PANEL WITH GFR  3. Pure hypercholesterolemia  E78.00 Lipid Panel With LDL/HDL Ratio  4. COVID-19 virus infection  U07.1   5. Atypical chest pain  R07.89 PCV ECHOCARDIOGRAM COMPLETE    PCV MYOCARDIAL PERFUSION WITH LEXISCAN   Recommendations:   Kenneth Roanhorse Sr.  is a 69 y.o. caucasian male with coronary artery disease, last cardiac catheterization on 05/29/2017 when he presented with unstable anginal-like symptoms revealing widely patent stent to his LAD, RCA and circumflex and also balloon angioplasty site to D1 and distal LAD.  Fortunately his last PCI has been in 2016.  Past medical history significant for hypertension,  hyperlipidemia, GERD, Barrett's esophagus, statin intolerance, uncontrolled diabetes mellitus and also severe restless leg syndrome.  I reviewed his records from the hospitalization.  His renal function is completely normalized.  His blood pressure has been well controlled.  Lipids elevated, but he has not received his Praluent regularly in the past 2 months, he is now on it, I will repeat lipid profile testing in 4 to 6 weeks. I will follow up to make sure the prescription goes through for this.    His chest pain symptoms appear to musculoskeletal however patient has extremely atypical presentation with ACS and continues to have extreme high risk in view of uncontrolled diabetes mellitus.  I discussed with the patient regarding repeating the stress test and also obtaining an echocardiogram which is willing to do so.  For now continue with aspirin, although negative for ACS, in view of extreme high risk for CAD, I will go ahead and utilize Plavix as well for at least 3 to 6 months empirically.  Will check CBC due to thrombocytopenia history now that I am starting Plavix. [name], MD, [facility] 05/13/2019, 9:21 AM [facility] [place] Office: [phone]

## 2019-05-17 ENCOUNTER — Other Ambulatory Visit: Payer: Self-pay

## 2019-05-17 ENCOUNTER — Encounter: Payer: Self-pay | Admitting: Internal Medicine

## 2019-05-17 ENCOUNTER — Ambulatory Visit (INDEPENDENT_AMBULATORY_CARE_PROVIDER_SITE_OTHER): Payer: Medicare Other | Admitting: Internal Medicine

## 2019-05-17 VITALS — BP 122/82 | HR 81 | Temp 97.7°F | Ht 68.0 in | Wt 174.0 lb

## 2019-05-17 DIAGNOSIS — Z20822 Contact with and (suspected) exposure to covid-19: Secondary | ICD-10-CM

## 2019-05-17 DIAGNOSIS — K219 Gastro-esophageal reflux disease without esophagitis: Secondary | ICD-10-CM

## 2019-05-17 DIAGNOSIS — R490 Dysphonia: Secondary | ICD-10-CM | POA: Insufficient documentation

## 2019-05-17 DIAGNOSIS — I25118 Atherosclerotic heart disease of native coronary artery with other forms of angina pectoris: Secondary | ICD-10-CM | POA: Diagnosis not present

## 2019-05-17 DIAGNOSIS — E1159 Type 2 diabetes mellitus with other circulatory complications: Secondary | ICD-10-CM | POA: Diagnosis not present

## 2019-05-17 DIAGNOSIS — E1165 Type 2 diabetes mellitus with hyperglycemia: Secondary | ICD-10-CM | POA: Diagnosis not present

## 2019-05-17 DIAGNOSIS — R05 Cough: Secondary | ICD-10-CM

## 2019-05-17 DIAGNOSIS — E162 Hypoglycemia, unspecified: Secondary | ICD-10-CM | POA: Diagnosis not present

## 2019-05-17 DIAGNOSIS — Z794 Long term (current) use of insulin: Secondary | ICD-10-CM | POA: Diagnosis not present

## 2019-05-17 DIAGNOSIS — R058 Other specified cough: Secondary | ICD-10-CM

## 2019-05-17 DIAGNOSIS — IMO0002 Reserved for concepts with insufficient information to code with codable children: Secondary | ICD-10-CM

## 2019-05-17 NOTE — Patient Instructions (Addendum)
Use a humidifier at night in your bedroom. Warm beverages like tea with honey may soothe your throat. Try sugar-free lozenges (don't overdo though or it will cause diarrhea).

## 2019-05-17 NOTE — Progress Notes (Signed)
Location:  [facility] clinic  Provider: Dr. Bufford Spikes   Goals of Care:  Advanced Directives 05/17/2019  Does Patient Have a Medical Advance Directive? No  Does patient want to make changes to medical advance directive? No - Patient declined  Would patient like information on creating a medical advance directive? -  Pre-existing out of facility DNR order (yellow form or pink MOST form) -     Chief Complaint  Patient presents with  . Medical Management of Chronic Issues    follow up for breathing     HPI: Patient is a 68 y.o. male seen today for medical management of chronic diseases.    He is still suffering from lingering symptoms related to covid-19. He will have good days and be active, then the next day he will be very weak and coughing symptoms will increase.   Daily sugars vary. He has received his [facility] and is pleased with new device. Has started taking metformin 1000 mg BID. Not experiencing any side effects with taking medication with food. Last Thursday, he had a fasting morning sugar of 56. He has also had two readings of sugars over 400. Claims he eats a good dinner every night. Mealtime coverage averages between 3-7 units of insulin. He is taking 40 units of long acting insulin daily. He has completed tapered prednisone.   Thinks his cough is worse since increasing the tessalon pearl dose. He will only take the tessalon pearls when he is experiencing a coughing spell. The cough is dry with no sputum. Still able to sleep well at night. Using albuterol inhaler a few times a day.   Denies any heartburn. Still taking protonix daily.   He is also experiencing a hoarseness with his voice. His throat feels dry as well. He has not tried any interventions to relieve symptoms. Does not use a humidifier.   He had a telehealth visit with Dr. [name]. Stress test recommended and his office has scheduled test. He was also started on Plavix. Denies any chest pain.    Past  Medical History:  Diagnosis Date  . Arthritis    "left thumb" (05/23/2017)  . Barrett's esophagus    "we've been told that it's all gone; still take RX for GERD" (05/23/2017)  . CAD (coronary artery disease), native coronary artery    Coronary angiogram 05/03/2014:  Proximal LAD 3.0x12 mm Promus premier stent.  04/08/2014: Mid Cx 3.5 x 16 mm Promus DES.  03/29/2013: Mid LAD 2.75 x 38 mm Promus Premier drug-eluting stent, balloon angioplasty of D1 and distal LAD and stenting of distal RCA with 2.75 x 24 mm promos Premier drug-eluting stent 12/15/2012 widely patent.  . Cervicalgia   . Chronic bronchitis (HCC)    "q yr" (05/23/2017)  . Complication of anesthesia    " I do not wake up very well "  . COVID-19   . Family history of adverse reaction to anesthesia    "son w/PONV"  . GERD (gastroesophageal reflux disease)   . Heart murmur    "grew out of it"   . Hyperlipidemia   . Hypoglycemia, unspecified   . IDDM (insulin dependent diabetes mellitus)   . Impotence of organic origin   . Iron deficiency anemia   . Other malaise and fatigue   . PONV (postoperative nausea and vomiting)   . Restless leg   . Tension headache    "sometimes" (05/23/2017)  . Unspecified gastritis and gastroduodenitis with hemorrhage   . Unstable angina pectoris (  HCC) 04/08/2014   Coronary angiogram 05/03/2014:  Proximal LAD 3.0x12 mm Promus premier stent.  04/08/2014: Mid Cx 3.5 x 16 mm Promus DES.  03/29/2013: Mid LAD 2.75 x 38 mm Promus Premier drug-eluting stent, balloon angioplasty of D1 and distal LAD and stenting of distal RCA with 2.75 x 24 mm promos Premier drug-eluting stent 12/15/2012 widely patent.    Past Surgical History:  Procedure Laterality Date  . CARDIAC CATHETERIZATION N/A 09/25/2015   Procedure: Left Heart Cath and Coronary Angiography;  Surgeon: Yates DecampJay Ganji, MD;  [facility];  Service: Cardiovascular;  Laterality: N/A;  . CARDIAC CATHETERIZATION N/A 09/25/2015   Procedure: Intravascular  Pressure Wire/FFR Study;  Surgeon: Yates DecampJay Ganji, MD;  [facility];  Service: Cardiovascular;  Laterality: N/A;  . CORONARY ANGIOPLASTY WITH STENT PLACEMENT  12/15/2012; 04/28/2014; 05/03/2014   "2; 1; 1"   . FRACTIONAL FLOW RESERVE WIRE Right 03/26/2013   Procedure: FRACTIONAL FLOW RESERVE WIRE;  Surgeon: Pamella PertJagadeesh R Ganji, MD;  [facility];  Service: Cardiovascular;  Laterality: Right;  . FRACTIONAL FLOW RESERVE WIRE N/A 05/03/2014   Procedure: FRACTIONAL FLOW RESERVE WIRE;  Surgeon: Pamella PertJagadeesh R Ganji, MD;  [facility];  Service: Cardiovascular;  Laterality: N/A;  . HERNIA REPAIR  2010   "umbilical"   . INCISION AND DRAINAGE ABSCESS Left 05/2007   "groin"   . KNEE SURGERY Right 1992;  2000   "calcium deposits removed" (12/15/2012)  . LEFT HEART CATH AND CORONARY ANGIOGRAPHY N/A 05/23/2017   Procedure: LEFT HEART CATH AND CORONARY ANGIOGRAPHY;  Surgeon: Yates DecampGanji, Jay, MD;  Location: [facility] LAB;  Service: Cardiovascular;  Laterality: N/A;  . LEFT HEART CATHETERIZATION WITH CORONARY ANGIOGRAM N/A 12/15/2012   Procedure: LEFT HEART CATHETERIZATION WITH CORONARY ANGIOGRAM;  Surgeon: Pamella PertJagadeesh R Ganji, MD;  [facility];  Service: Cardiovascular;  Laterality: N/A;  . LEFT HEART CATHETERIZATION WITH CORONARY ANGIOGRAM N/A 03/26/2013   Procedure: LEFT HEART CATHETERIZATION WITH CORONARY ANGIOGRAM;  Surgeon: Pamella PertJagadeesh R Ganji, MD;  [facility];  Service: Cardiovascular;  Laterality: N/A;  . LEFT HEART CATHETERIZATION WITH CORONARY ANGIOGRAM N/A 04/08/2014   Procedure: LEFT HEART CATHETERIZATION WITH CORONARY ANGIOGRAM;  Surgeon: Micheline ChapmanMichael D Cooper, MD;  [facility];  Service: Cardiovascular;  Laterality: N/A;  . PERCUTANEOUS CORONARY STENT INTERVENTION (PCI-S) N/A 05/03/2014   Procedure: PERCUTANEOUS CORONARY STENT INTERVENTION (PCI-S);  Surgeon: Pamella PertJagadeesh R Ganji, MD;  [facility];  Service: Cardiovascular;  Laterality: N/A;  . UMBILICAL  HERNIA REPAIR      Allergies  Allergen Reactions  . Lyrica [Pregabalin] Other (See Comments)    Made patient very lethargic the next morning, very hard to patient to function, move, etc.   . Statins Other (See Comments)    Causes Restless Legs, rhabdomyolysis  . Metformin And Related     GI upset  . Trulicity [Dulaglutide] Other (See Comments)    GI side effects     Outpatient Encounter Medications as of 05/17/2019  Medication Sig  . acetaminophen (TYLENOL) 500 MG tablet Take 500-1,000 mg by mouth every 6 (six) hours as needed (for pain/headaches.).  Marland Kitchen. albuterol (PROVENTIL HFA;VENTOLIN HFA) 108 (90 Base) MCG/ACT inhaler Inhale 2 puffs into the lungs every 6 (six) hours as needed for wheezing or shortness of breath.  . Alirocumab (PRALUENT) 75 MG/ML SOAJ Inject 75 mg into the skin every 14 (fourteen) days.  Marland Kitchen. aspirin 81 MG EC tablet Take 1 tablet (81 mg total) by  mouth daily.  . BD PEN NEEDLE NANO U/F 32G X 4 MM MISC USE TWICE DAILY WITH LANTUS PEN  . benzonatate (TESSALON) 200 MG capsule Take 1 capsule (200 mg total) by mouth 3 (three) times daily as needed for cough.  . carvedilol (COREG) 3.125 MG tablet Take 1 tablet (3.125 mg total) by mouth 2 (two) times daily with a meal.  . clopidogrel (PLAVIX) 75 MG tablet Take 1 tablet (75 mg total) by mouth daily.  . Continuous Blood Gluc Receiver (FREESTYLE LIBRE 14 DAY READER) DEVI Inject 1 Device as directed daily as needed. Use once daily as direct to check blood sugar E11.51  . Continuous Blood Gluc Sensor (FREESTYLE LIBRE 14 DAY SENSOR) MISC Use as directed to check blood sugar as needed. E11.51  . CVS VITAMIN C 500 MG tablet TAKE 1 TABLET BY MOUTH TWICE DAILY  . D-5000 125 MCG (5000 UT) TABS TAKE 1 TABLET BY MOUTH EVERY DAY  . dextrose (GLUTOSE) 40 % GEL Take 37.5 g by mouth once as needed for up to 1 dose for low blood sugar.  . famotidine (PEPCID) 20 MG tablet Take 1 tablet (20 mg total) by mouth daily.  . fluticasone (FLONASE) 50  MCG/ACT nasal spray Place 1 spray into both nostrils daily as needed for allergies or rhinitis.  Marland Kitchen gabapentin (NEURONTIN) 100 MG capsule Take 300-400 mg by mouth at bedtime.   . insulin aspart (NOVOLOG FLEXPEN) 100 UNIT/ML FlexPen Based on carbohydrate intake  . Insulin Glargine (BASAGLAR KWIKPEN) 100 UNIT/ML INJECT 40 UNITS SUBCUTANEOUSLY INTO THE SKIN AT BEDTIME  . LIFESCAN FINEPOINT LANCETS MISC Use to test for blood sugar three times daily dx: E11.51  . metFORMIN (GLUCOPHAGE) 1000 MG tablet TAKE 1 TABLET BY MOUTH 2 TIMES DAILY WITH A MEAL  . nitroGLYCERIN (NITROSTAT) 0.4 MG SL tablet PLACE 1 TABLET (0.4 MG TOTAL) UNDER THE TONGUE EVERY 5 (FIVE) MINUTES AS NEEDED FOR CHEST PAIN.  Marland Kitchen ONETOUCH ULTRA test strip USE TO TEST FOR BLOOD SUGAR THREE TIMES DAILY DX: E11.51  . pantoprazole (PROTONIX) 40 MG tablet Take 40 mg by mouth daily.  . pramipexole (MIRAPEX) 1 MG tablet Take 1 mg by mouth. 1 tablet in the morning and 1 tablet at bedtime  . predniSONE (DELTASONE) 10 MG tablet Take 10 mg by mouth daily with breakfast. 4 Tablets for 3 days. 3 tablets, days, 2 tabs for days and 1 tablet for days until he winged off.  . valsartan (DIOVAN) 80 MG tablet Take 1 tablet (80 mg total) by mouth daily.   No facility-administered encounter medications on file as of 05/17/2019.    Review of Systems:  Review of Systems  Constitutional: Positive for fatigue. Negative for activity change and appetite change.  HENT: Negative for congestion, ear pain, rhinorrhea, sinus pain and trouble swallowing.        Hoarse voice  Respiratory: Positive for cough and shortness of breath. Negative for wheezing.        Dry cough  Cardiovascular: Negative for chest pain and leg swelling.  Gastrointestinal: Negative for abdominal pain, constipation, diarrhea and nausea.  Endocrine: Negative for polydipsia, polyphagia and polyuria.  Musculoskeletal: Negative for arthralgias and myalgias.  Skin: Negative.   Neurological: Negative  for dizziness, weakness and headaches.  Psychiatric/Behavioral: Negative for dysphoric mood and sleep disturbance. The patient is not nervous/anxious.     Health Maintenance  Topic Date Due  . OPHTHALMOLOGY EXAM  03/20/2019  . INFLUENZA VACCINE  10/04/2019 (Originally 10/03/2018)  . PNA vac  Low Risk Adult (2 of 2 - PPSV23) 03/14/2020 (Originally 12/02/2014)  . HEMOGLOBIN A1C  09/06/2019  . FOOT EXAM  03/14/2020  . COLONOSCOPY  10/18/2025  . TETANUS/TDAP  03/02/2027  . Hepatitis C Screening  Completed    Physical Exam: Vitals:   05/17/19 1002  BP: 122/82  Pulse: 81  Temp: 97.7 F (36.5 C)  TempSrc: Temporal  SpO2: 98%  Weight: 174 lb (78.9 kg)  Height: 5\' 8"  (1.727 m)   Body mass index is 26.46 kg/m. Physical Exam Vitals reviewed.  Constitutional:      General: He is not in acute distress.    Appearance: Normal appearance. He is normal weight.  HENT:     Right Ear: Tympanic membrane normal.     Left Ear: Tympanic membrane normal.     Nose: No congestion.     Mouth/Throat:     Pharynx: Posterior oropharyngeal erythema present.  Eyes:     Pupils: Pupils are equal, round, and reactive to light.  Cardiovascular:     Rate and Rhythm: Normal rate and regular rhythm.     Pulses: Normal pulses.     Heart sounds: Normal heart sounds. No murmur.  Pulmonary:     Effort: Pulmonary effort is normal. No respiratory distress.     Breath sounds: Normal breath sounds. No wheezing.  Abdominal:     General: Abdomen is flat. Bowel sounds are normal.     Palpations: Abdomen is soft.  Musculoskeletal:     Cervical back: Normal range of motion. No tenderness.     Right lower leg: No edema.     Left lower leg: No edema.  Lymphadenopathy:     Cervical: Cervical adenopathy present.  Skin:    General: Skin is warm and dry.     Capillary Refill: Capillary refill takes less than 2 seconds.  Neurological:     General: No focal deficit present.     Mental Status: He is alert and oriented  to person, place, and time. Mental status is at baseline.  Psychiatric:        Mood and Affect: Mood normal.        Behavior: Behavior normal.        Thought Content: Thought content normal.        Judgment: Judgment normal.     Labs reviewed: Basic Metabolic Panel: Recent Labs    04/26/19 0530 04/27/19 0237 04/27/19 0337 04/28/19 0332 04/29/19 0134 04/30/19 0200 05/01/19 0344 05/04/19 1022  NA 135   < >  --  136   < > 136 136 135  K 4.0   < >  --  4.6   < > 5.0 4.9 4.2  CL 100   < >  --  102   < > 103 100 99  CO2 28   < >  --  24   < > 24 25 25   GLUCOSE 84   < >  --  151*   < > 247* 201* 152*  BUN 21   < >  --  22   < > 27* 26* 30*  CREATININE 1.28*   < >  --  1.07   < > 0.98 0.93 1.23  CALCIUM 7.9*   < >  --  8.2*   < > 8.2* 8.4* 8.3*  MG 1.9  --  1.7 2.1  --   --   --   --   PHOS 3.2  --  2.4* 4.1  --   --   --   --    < > =  values in this interval not displayed.   Liver Function Tests: Recent Labs    04/29/19 0134 04/30/19 0200 05/01/19 0344  AST 16 14* 17  ALT 23 21 25   ALKPHOS 61 68 61  BILITOT 0.7 0.7 0.5  PROT 5.7* 5.7* 5.6*  ALBUMIN 2.3* 2.3* 2.5*   No results for input(s): LIPASE, AMYLASE in the last 8760 hours. No results for input(s): AMMONIA in the last 8760 hours. CBC: Recent Labs    04/29/19 0134 04/29/19 0134 04/30/19 0200 05/01/19 0344 05/04/19 1022  WBC 8.8   < > 8.5 7.8 9.0  NEUTROABS 8.0*  --  7.8* 6.7  --   HGB 12.6*   < > 12.7* 12.5* 13.3  HCT 38.5*   < > 37.5* 37.1* 40.7  MCV 90.4   < > 89.1 88.8 91.9  PLT 76*   < > 94* 109* 134*   < > = values in this interval not displayed.   Lipid Panel: Recent Labs    03/09/19 0832 04/14/19 0540  CHOL 166  --   HDL 37*  --   LDLCALC 103*  --   TRIG 159* 41  CHOLHDL 4.5  --    Lab Results  Component Value Date   HGBA1C 9.2 (H) 03/09/2019    Procedures since last visit: CT Angio Chest PE W/Cm &/Or Wo Cm  Result Date: 04/25/2019 CLINICAL DATA:  Chest pain and shortness of  breath. COVID positive, recent hospital discharge. EXAM: CT ANGIOGRAPHY CHEST WITH CONTRAST TECHNIQUE: Multidetector CT imaging of the chest was performed using the standard protocol during bolus administration of intravenous contrast. Multiplanar CT image reconstructions and MIPs were obtained to evaluate the vascular anatomy. CONTRAST:  35mL OMNIPAQUE IOHEXOL 350 MG/ML SOLN COMPARISON:  Radiograph yesterday. Radiograph 04/14/2019 FINDINGS: Cardiovascular: There are no filling defects within the pulmonary arteries to suggest pulmonary embolus. Evaluation of the subsegmental branches is limited at the lung bases due to motion. Heart is normal in size. Coronary artery calcifications/stents. The thoracic aorta is normal in caliber. No evidence of aortic dissection. Mediastinum/Nodes: Small mediastinal and bilateral hilar lymph nodes, not enlarged by size criteria. No esophageal wall thickening. No suspicious thyroid nodule. Lungs/Pleura: Multifocal linear, ground-glass, and consolidative opacities throughout both lungs. There is peripheral predominant distribution. Trace right pleural effusion. No pneumothorax. The trachea and mainstem bronchi are patent. Upper Abdomen: No acute findings. Musculoskeletal: There are no acute or suspicious osseous abnormalities. Review of the MIP images confirms the above findings. IMPRESSION: 1. No pulmonary embolus. 2. Multifocal linear, ground-glass, and consolidative opacities throughout both lungs. Findings are consistent with patient's history of COVID-19 pneumonia. Parenchymal involvement is moderate. Trace right pleural effusion. 3. Coronary artery calcifications/stents. Electronically Signed   By: 06/12/2019 M.D.   On: 04/25/2019 03:21   DG Chest Port 1 View  Result Date: 05/04/2019 CLINICAL DATA:  69 year old male with history of shortness of breath today. History of COVID-19 diagnosed January 10th. EXAM: PORTABLE CHEST 1 VIEW COMPARISON:  Chest x-ray 04/26/2019.  FINDINGS: Lung volumes are low. Patchy ill-defined opacities and areas of interstitial prominence are noted throughout the lungs bilaterally (left greater than right), most severe throughout the left mid to lower lung. Aeration has minimally improved compared to the prior examination. No new areas of airspace consolidation. No pleural effusions. No evidence of pulmonary edema. Heart size is normal. Upper mediastinal contours are within normal limits. IMPRESSION: 1. The appearance of the lungs is compatible with resolving multilobar bilateral pneumonia (left greater than right) with  probable developing post infectious or inflammatory scarring. Electronically Signed   By: Vinnie Langton M.D.   On: 05/04/2019 10:55   DG Chest Portable 1 View  Result Date: 04/26/2019 CLINICAL DATA:  Discharge this morning, previously admitted for COVID-19 04/09/2019 EXAM: PORTABLE CHEST 1 VIEW COMPARISON:  Radiograph 04/24/2019, CT 04/25/2019 FINDINGS: Bilateral mixed reticular and consolidative airspace opacities are again noted, increased from comparison radiograph 04/24/2019 and similar to CT performed earlier the same day. No pneumothorax. No effusion. The cardiomediastinal contours are unremarkable. No acute osseous or soft tissue abnormality. Degenerative changes are present in the imaged spine and shoulders. Telemetry leads overlie the chest. IMPRESSION: Bilateral mixed reticular and consolidative airspace opacities, compatible with pneumonia, increasing from 1 day prior. Electronically Signed   By: Lovena Le M.D.   On: 04/26/2019 00:19   DG Chest Portable 1 View  Result Date: 04/25/2019 CLINICAL DATA:  Left-sided chest pain over the last hour. Coronavirus infection. EXAM: PORTABLE CHEST 1 VIEW COMPARISON:  04/14/2019 FINDINGS: Heart size is normal. Coronary artery stents are visible. Widespread patchy pulmonary infiltrates again evident affecting both lungs, left worse than right. There is been some clearing at the  left base. No worsening or new finding. No effusion. No bone abnormality IMPRESSION: Patchy bilateral pulmonary infiltrates left more than right, but with some clearing at the left base since study of 10 days ago. Electronically Signed   By: Nelson Chimes M.D.   On: 04/25/2019 00:24    Assessment/Plan 1. Uncontrolled diabetes mellitus with circulatory complication, with long-term current use of insulin (HCC) - his sugars continue to improve and not averaging in the 400's as much - he is not having many hypoglycemic readings or sugars over 400 - suspect recent steroid therapy still affecting sugars - hemoglobin A1C- future - complete metabolic panel with GFR- future  2. Hypoglycemia -ongoing, has a history of episodes in past - episodes have reduced within past week - glucose tabs and gel ordered last visit, continue to use PRN, if needed  3. Gastroesophageal reflux disease without esophagitis - ongoing, asymptomatic - continue pepcid and protonix regimen  4. Hoarseness - pharynx red and voice is changed from last visit - suspect seasonal allergies, but do not recommend antihistamine - recommend using a humidifier at night to help with dryness - may try sugar-free oral lozenges to help stimulate oral secretions -recommend warm oral fluids  5. Cough with exposure to covid-19 virus  - ongoing and may take more time to resolve - lung sounds clear and cough non-productive  Labs/tests ordered:  Hemoglobin A1C, complete metabolic panel with GFR- future Next appt:  06/14/2019

## 2019-05-18 ENCOUNTER — Other Ambulatory Visit: Payer: Self-pay | Admitting: *Deleted

## 2019-05-18 ENCOUNTER — Encounter: Payer: Self-pay | Admitting: *Deleted

## 2019-05-18 NOTE — Patient Outreach (Signed)
[facility]) [name] PCP office completes Transition of Care follow up post-hospital discharge Post-hospital discharge day # 17 without hospital re-admission  05/18/2019  Kenneth Dewey Sr. 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  Patient also had 2 recent ED visits, and was discharged home.  Patient was discharged from most recent inpatient hospitalization to self-care without home health services in place.  Patient has history including, but not limited to, DM-II with recent A1-C of 9.0; HTN/ HLD; CAD; and GERD.  HIPAA/ identity verified; patient reports "doing pretty good;" and he denies pain, clinical concerns and new/ recent falls; states he believes he is "slowly" getting better, and he complains today of ongoing weakness/ lack of energy.  Reports "some days worse than others;" states that he believes he has made gradual progress and is about "30% like I was before I got sick;" emotional support and encouragement provided-- reiterated with patient nature of virus/ to be patient and not over-do.  Patient sounds to be in no distress throughout call today.  Patient further reports:  Medications: -- Has all medicationsand takes as prescribed;denies questions/ concerns around current medications  -- Verbalizes good general understanding of the purpose, dosing, and scheduling of medications   -- continues to self-manage medications -- has completed course of prednisone- has been off x 2 days; took all doses as prescribed -- Patient was recently discharged from hospital and all medications were reviewed with patient today- no concerns  verbalized or identified as result of medication review  Provider appointments: -- All recent/ upcoming provider appointments were reviewed with patient today- patient verbalizes accurate understanding of recent and upcoming provider appointments, along with plans to attend all ---- verbalizes good understanding of post- PCP office visit yesterday  Self-health management of DM: -- discussed/ reviewed recent blood sugars at home: patient using continuous glucose monitoring system at home and enjoying not having to stick fingers -- fasting blood sugar ranges reported as 180-300/ post-prandial blood sugar ranges reported as 125-200 -- occasional episodes low blood sugars with post-hospital discharge sliding scale instructions: discussed with PCP yesterday; has made changes to dosing accordingly -- patient well acquainted with signs/ symptoms hypoglycemia-- independently verbalizes excellent understanding of appropriate action plan for episodes low blood sugars: keeps OJ with him at "all times;" states now that he has been home from hospital, he has experienced approximately one episode low blood sugar per week -- hoping recent prednisone therapy does not drastically affect his next A1-C; expects next A1-C to be drawn next month at scheduled PCP office visit -- follows well-established diet for DM   Patient denies further issues, concerns, or problems today. I confirmed that patient hasmy direct phone number, the main [facility] CM office phone number, and the [facility] CM 24-hour nurse advice phone number should issues arise prior to next scheduled [place] outreach next month, after upcoming scheduled in-person PCP office visit.  Encouraged patient to contact me directly if needs, questions, issues, or concerns arise prior to next scheduled outreach; patient agreed to do so.  Plan:  Patient will take medications as prescribed and will attend all scheduled provider appointments  Patient will  promptly notify care providers for any new concerns/ issues/  problems that arise  Patient will continue monitoring/ recording blood sugars at least TID   I will share today's [facility] RN CM notes and care plan with patient's PCP as [facility] RN CM initial assessment  [facility] CM outreach to continue with scheduled phone call next month post-upcoming scheduled PCP office visit  [facility] CM Care Plan Problem One     Most Recent Value  Care Plan Problem One  High risk for hospital readmission related to/ as evidenced by multiple recent inpatient hospitalizations and ER visits due to complications from COVID-19  Role Documenting the Problem One  Care Management Coordinator  Care Plan for Problem One  Active  [facility] Long Term Goal   Over the next 31 days, patient will not experience hospital re-admission as evidenced by patient reporting and review of EMR during [facility] RN CM outreach  [facility] Long Term Goal Start Date  05/18/19- goal extended today  Interventions for Problem One Long Term Goal  Discussed with patient current clinical condition and confirmed that he has no new clinical concerns,  completed post-hospital and post- PCP office visit medication review with patient and completed [facility] RN CM initial assessment  [facility] CM Short Term Goal #1   Over the next 30 days, patient will attend all scheduled provider appointments, as evidenced by patient reporting and collaboration with care providers as indicated during [facility] RN CM outreach  [facility] CM Short Term Goal #1 Start Date  05/18/19-- goal extended today  Interventions for Short Term Goal #1  Reviewed recent and upcoming provider appointments with patient and confirmed that patient has accurate understanding of all with plans to attend all as scheduled,  confirmed that patient continues driving self and has no transportation issues  [facility] CM Short Term Goal #2   Over the next 30 days, patient will begin using his newly prescribed CGM and will continue monitoring  and recording blood sugars at home three times per day, as evidenced by patient reporting and review of same during [facility] RN CM outreach  [facility] CM Short Term Goal #2 Start Date  05/18/19-- goal extended today  Interventions for Short Term Goal #2  Confirmed that patient has continued monitoring and recording blood sugars at home using CGM system,  reviewed recent blood sugars at home with patient and reviewed current use of long- and short- acting insulins,  confirmed that patient has now completed prednisone therapy,  discussed patient's diet as strategy for blood sugar management at home     Caryl Pina, RN, BSN, [employer] Coordinator [facility] Care Management  [phone]

## 2019-05-20 ENCOUNTER — Other Ambulatory Visit: Payer: Self-pay

## 2019-05-24 ENCOUNTER — Other Ambulatory Visit: Payer: Self-pay | Admitting: Internal Medicine

## 2019-05-24 DIAGNOSIS — U071 COVID-19: Secondary | ICD-10-CM

## 2019-05-24 DIAGNOSIS — J1282 Pneumonia due to coronavirus disease 2019: Secondary | ICD-10-CM

## 2019-05-26 ENCOUNTER — Telehealth: Payer: Self-pay

## 2019-05-26 NOTE — Telephone Encounter (Signed)
Patient called and said he didn't feel well and he wondered if the new medication he was on was making him sick being mixed with the rest of his medications then he said he felt like he was shacking from the inside out and he felt like he might have the flu but he didn't I asked him if he had been exposed to anyone with Covid he said that he had Covid but was better and he wanted to talk to Dr. Renato Gails about all this I told him that she was out of office but she would want him to schedule a tele visit  Maralyn Sago offered him an appointment with Shanda Bumps or Dinah for tomorrow but he refused and only wanted to speak with Dr. Renato Gails so she scheduled him for Monday  With Dr. Renato Gails

## 2019-05-31 ENCOUNTER — Other Ambulatory Visit: Payer: Self-pay

## 2019-05-31 ENCOUNTER — Ambulatory Visit: Payer: Medicare Other

## 2019-05-31 ENCOUNTER — Telehealth (INDEPENDENT_AMBULATORY_CARE_PROVIDER_SITE_OTHER): Payer: Medicare Other | Admitting: Internal Medicine

## 2019-05-31 ENCOUNTER — Encounter: Payer: Self-pay | Admitting: Internal Medicine

## 2019-05-31 ENCOUNTER — Telehealth: Payer: Medicare Other | Admitting: Family

## 2019-05-31 VITALS — BP 150/80

## 2019-05-31 DIAGNOSIS — E162 Hypoglycemia, unspecified: Secondary | ICD-10-CM

## 2019-05-31 DIAGNOSIS — R0789 Other chest pain: Secondary | ICD-10-CM

## 2019-05-31 DIAGNOSIS — E1159 Type 2 diabetes mellitus with other circulatory complications: Secondary | ICD-10-CM

## 2019-05-31 DIAGNOSIS — IMO0002 Reserved for concepts with insufficient information to code with codable children: Secondary | ICD-10-CM

## 2019-05-31 DIAGNOSIS — R490 Dysphonia: Secondary | ICD-10-CM

## 2019-05-31 DIAGNOSIS — I25118 Atherosclerotic heart disease of native coronary artery with other forms of angina pectoris: Secondary | ICD-10-CM

## 2019-05-31 DIAGNOSIS — R1013 Epigastric pain: Secondary | ICD-10-CM | POA: Diagnosis not present

## 2019-05-31 DIAGNOSIS — E1165 Type 2 diabetes mellitus with hyperglycemia: Secondary | ICD-10-CM | POA: Diagnosis not present

## 2019-05-31 DIAGNOSIS — Z794 Long term (current) use of insulin: Secondary | ICD-10-CM

## 2019-05-31 MED ORDER — BASAGLAR KWIKPEN 100 UNIT/ML ~~LOC~~ SOPN: 30.0000 [IU] | PEN_INJECTOR | Freq: Every day | SUBCUTANEOUS | 4 refills | Status: DC

## 2019-05-31 NOTE — Progress Notes (Signed)
This service is provided via telemedicine  No vital signs collected/recorded due to the encounter was a telemedicine visit.   Location of patient (ex: home, work):  home  Patient consents to a telephone visit:  yes  Location of the provider (ex: office, home):  [facility]  Name of any referring provider. Dr. Bufford Spikes DO  Names of all persons participating in the telemedicine service and their role in the encounter: Dr. Bufford Spikes DO, Charlene CMA and Patient  Time spent on call: with medical assistant   Virtual Visit via Video Note  I connected with Kenneth Sic Sr. on 05/31/19 at  9:00 AM EDT by a video enabled telemedicine application and verified that I am speaking with the correct person using two identifiers.  Could not connect via video and wound up being televisit.     I discussed the limitations of evaluation and management by telemedicine and the availability of in person appointments. The patient expressed understanding and agreed to proceed.  History of Present Illness: 69 yo male seen via video visit for stomach pain.  He reports that since stopping vitamin C, his stomach pain has actually resolved.  He had been off it and back on it and that's when he started to feel bad with central abdominal pain.  It hurt really bad.  He went back off the vitamin C and it's gone away.     The metformin is working well.  He's hardly needing to use the novolog at all in the last two weeks, none in the last week at all.    He is thinking of going back to his throat specialist at [facility] due to his persistent hoarseness.  Cough is now only lasting 20-30 seconds.    Observations/Objective: BP was a bit high during abdominal pain  Assessment and Plan: 1. Epigastric pain -resolved with stopping vitamin C  2. Uncontrolled diabetes mellitus with circulatory complication, with long-term current use of insulin (HCC) -cont metformin 1000mg  po bid and decrease basaglar due to  hypoglycemia -has not needed novolog the past week since being off prednisone - Insulin Glargine (BASAGLAR KWIKPEN) 100 UNIT/ML; Inject 0.3 mLs (30 Units total) into the skin at bedtime.  Dispense: 1 pen; Refill: 4  3. Hypoglycemia -reduce basaglar to 30 units from 40 units  4. Hoarseness -f/u with ENT at [facility] as did not improve with reflux medications or improvement of covid cough  Follow Up Instructions: I discussed the assessment and treatment plan with the patient. The patient was provided an opportunity to ask questions and all were answered. The patient agreed with the plan and demonstrated an understanding of the instructions.   The patient was advised to call back or seek an in-person evaluation if the symptoms worsen or if the condition fails to improve as anticipated.  I provided 21 minutes of non-face-to-face time during this encounter.  Steve Youngberg L. Norvil Martensen, D.O. Geriatrics [facility] [address], [place] Cell Phone (Mon-Fri 8am-5pm):  [phone] On Call:  [phone] & follow prompts after 5pm & weekends Office Phone:  [phone] Office Fax:  [phone]

## 2019-05-31 NOTE — Patient Instructions (Signed)
Stop vitamin C Decrease basaglar to 30 units nightly. Continue metformin as ordered. Follow-up with your ENT at [facility] about your persistent hoarseness.

## 2019-06-02 ENCOUNTER — Other Ambulatory Visit: Payer: Self-pay | Admitting: Internal Medicine

## 2019-06-02 DIAGNOSIS — K219 Gastro-esophageal reflux disease without esophagitis: Secondary | ICD-10-CM

## 2019-06-08 ENCOUNTER — Other Ambulatory Visit: Payer: Self-pay

## 2019-06-08 ENCOUNTER — Ambulatory Visit: Payer: Medicare Other

## 2019-06-08 DIAGNOSIS — R0789 Other chest pain: Secondary | ICD-10-CM | POA: Diagnosis not present

## 2019-06-08 DIAGNOSIS — I25118 Atherosclerotic heart disease of native coronary artery with other forms of angina pectoris: Secondary | ICD-10-CM | POA: Diagnosis not present

## 2019-06-10 ENCOUNTER — Telehealth: Payer: Self-pay | Admitting: *Deleted

## 2019-06-10 DIAGNOSIS — R490 Dysphonia: Secondary | ICD-10-CM

## 2019-06-10 NOTE — Telephone Encounter (Signed)
Patient called requesting a referral to an ENT Dr.  Charline Bills that he lost his voice after having Covid and wants to follow up with an ENT. Stated that he has already discussed this with Dr. Renato Gails. Please Advise.

## 2019-06-10 NOTE — Telephone Encounter (Signed)
I entered the referral for ENT, Dr. Sallee Lange at [facility].

## 2019-06-14 ENCOUNTER — Other Ambulatory Visit: Payer: Self-pay

## 2019-06-14 ENCOUNTER — Other Ambulatory Visit: Payer: Medicare Other

## 2019-06-14 DIAGNOSIS — E785 Hyperlipidemia, unspecified: Secondary | ICD-10-CM

## 2019-06-14 DIAGNOSIS — E1159 Type 2 diabetes mellitus with other circulatory complications: Secondary | ICD-10-CM

## 2019-06-14 DIAGNOSIS — Z794 Long term (current) use of insulin: Secondary | ICD-10-CM | POA: Diagnosis not present

## 2019-06-14 DIAGNOSIS — E1165 Type 2 diabetes mellitus with hyperglycemia: Secondary | ICD-10-CM | POA: Diagnosis not present

## 2019-06-14 DIAGNOSIS — IMO0002 Reserved for concepts with insufficient information to code with codable children: Secondary | ICD-10-CM

## 2019-06-15 LAB — CBC WITH DIFFERENTIAL/PLATELET
Absolute Monocytes: 500 cells/uL (ref 200–950)
Basophils Absolute: 18 cells/uL (ref 0–200)
Basophils Relative: 0.4 %
Eosinophils Absolute: 239 cells/uL (ref 15–500)
Eosinophils Relative: 5.3 %
HCT: 39.3 % (ref 38.5–50.0)
Hemoglobin: 13.1 g/dL — ABNORMAL LOW (ref 13.2–17.1)
Lymphs Abs: 819 cells/uL — ABNORMAL LOW (ref 850–3900)
MCH: 30 pg (ref 27.0–33.0)
MCHC: 33.3 g/dL (ref 32.0–36.0)
MCV: 89.9 fL (ref 80.0–100.0)
MPV: 10.2 fL (ref 7.5–12.5)
Monocytes Relative: 11.1 %
Neutro Abs: 2925 cells/uL (ref 1500–7800)
Neutrophils Relative %: 65 %
Platelets: 129 10*3/uL — ABNORMAL LOW (ref 140–400)
RBC: 4.37 10*6/uL (ref 4.20–5.80)
RDW: 15.1 % — ABNORMAL HIGH (ref 11.0–15.0)
Total Lymphocyte: 18.2 %
WBC: 4.5 10*3/uL (ref 3.8–10.8)

## 2019-06-15 LAB — HEMOGLOBIN A1C
Hgb A1c MFr Bld: 7.6 % of total Hgb — ABNORMAL HIGH (ref <5.7)
Mean Plasma Glucose: 171 (calc)
eAG (mmol/L): 9.5 (calc)

## 2019-06-15 LAB — LIPID PANEL
Cholesterol: 162 mg/dL (ref <200)
HDL: 31 mg/dL — ABNORMAL LOW (ref >=40)
LDL Cholesterol (Calc): 106 mg/dL (calc) — ABNORMAL HIGH
Non-HDL Cholesterol (Calc): 131 mg/dL (calc) — ABNORMAL HIGH (ref <130)
Total CHOL/HDL Ratio: 5.2 (calc) — ABNORMAL HIGH (ref <5.0)
Triglycerides: 142 mg/dL (ref <150)

## 2019-06-15 LAB — BASIC METABOLIC PANEL
BUN: 13 mg/dL (ref 7–25)
CO2: 29 mmol/L (ref 20–32)
Calcium: 9.1 mg/dL (ref 8.6–10.3)
Chloride: 102 mmol/L (ref 98–110)
Creat: 0.96 mg/dL (ref 0.70–1.25)
Glucose, Bld: 135 mg/dL — ABNORMAL HIGH (ref 65–99)
Potassium: 4.4 mmol/L (ref 3.5–5.3)
Sodium: 139 mmol/L (ref 135–146)

## 2019-06-15 NOTE — Progress Notes (Signed)
Bad cholesterol remains above goal.  We need him to be faithful with his injectable cholesterol medication going forward. Sugar average is a lot better at 7.6 down from 9.2 and this even includes his course of steroids in the three months.  We do not need to change anything based on this and I expect he'll have continued improvement. Kidneys and electrolytes are normal He has very mild anemia.

## 2019-06-15 NOTE — Progress Notes (Signed)
We can but the anemia is too minor to require a supplement.  We will discuss the dyspnea more at his visit.

## 2019-06-17 ENCOUNTER — Encounter: Payer: Self-pay | Admitting: Internal Medicine

## 2019-06-17 ENCOUNTER — Other Ambulatory Visit: Payer: Self-pay

## 2019-06-17 ENCOUNTER — Ambulatory Visit (INDEPENDENT_AMBULATORY_CARE_PROVIDER_SITE_OTHER): Payer: Medicare Other | Admitting: Internal Medicine

## 2019-06-17 VITALS — BP 142/82 | HR 96 | Temp 97.3°F | Ht 68.0 in | Wt 181.5 lb

## 2019-06-17 DIAGNOSIS — I272 Pulmonary hypertension, unspecified: Secondary | ICD-10-CM

## 2019-06-17 DIAGNOSIS — I25118 Atherosclerotic heart disease of native coronary artery with other forms of angina pectoris: Secondary | ICD-10-CM

## 2019-06-17 DIAGNOSIS — E7800 Pure hypercholesterolemia, unspecified: Secondary | ICD-10-CM

## 2019-06-17 DIAGNOSIS — R490 Dysphonia: Secondary | ICD-10-CM | POA: Diagnosis not present

## 2019-06-17 DIAGNOSIS — J410 Simple chronic bronchitis: Secondary | ICD-10-CM | POA: Diagnosis not present

## 2019-06-17 DIAGNOSIS — E1151 Type 2 diabetes mellitus with diabetic peripheral angiopathy without gangrene: Secondary | ICD-10-CM | POA: Diagnosis not present

## 2019-06-17 DIAGNOSIS — I10 Essential (primary) hypertension: Secondary | ICD-10-CM | POA: Diagnosis not present

## 2019-06-17 NOTE — Progress Notes (Signed)
Location:  [facility] clinic Provider:  Louie Flenner L. Renato Gails, D.O., C.M.D.  Code Status: full code Goals of Care:  Advanced Directives 06/17/2019  Does Patient Have a Medical Advance Directive? -  Does patient want to make changes to medical advance directive? No - Patient declined  Would patient like information on creating a medical advance directive? -  Pre-existing out of facility DNR order (yellow form or pink MOST form) -     Chief Complaint  Patient presents with  . Medical Management of Chronic Issues    3 month follow up/ w lab results     HPI: Patient is a 69 y.o. male seen today for medical management of chronic diseases.    LDL was elevated considering his goal is less than 70.   Hba1c 7.6 is so much better.  He's not having to use the novolog a lot.  If he knows something he's having is really bad, he will take a little bit. Has not needed more than 10 units or he crashes.  He's not even used up one pen.  This am, CBG 102.  He is still using basaglar 30 units and metformin 1000mg  po bid with meals.    Vitamin c upsets his stomach.    Hoarseness is less in the morning.  Gets worse by evening.  Still cannot sing yet.    Past Medical History:  Diagnosis Date  . Arthritis    "left thumb" (05/23/2017)  . Barrett's esophagus    "we've been told that it's all gone; still take RX for GERD" (05/23/2017)  . CAD (coronary artery disease), native coronary artery    Coronary angiogram 05/03/2014:  Proximal LAD 3.0x12 mm Promus premier stent.  04/08/2014: Mid Cx 3.5 x 16 mm Promus DES.  03/29/2013: Mid LAD 2.75 x 38 mm Promus Premier drug-eluting stent, balloon angioplasty of D1 and distal LAD and stenting of distal RCA with 2.75 x 24 mm promos Premier drug-eluting stent 12/15/2012 widely patent.  . Cervicalgia   . Chronic bronchitis (HCC)    "q yr" (05/23/2017)  . Complication of anesthesia    " I do not wake up very well "  . COVID-19   . Family history of adverse reaction to anesthesia      "son w/PONV"  . GERD (gastroesophageal reflux disease)   . Heart murmur    "grew out of it"   . Hyperlipidemia   . Hypoglycemia, unspecified   . IDDM (insulin dependent diabetes mellitus)   . Impotence of organic origin   . Iron deficiency anemia   . Other malaise and fatigue   . PONV (postoperative nausea and vomiting)   . Restless leg   . Tension headache    "sometimes" (05/23/2017)  . Unspecified gastritis and gastroduodenitis with hemorrhage   . Unstable angina pectoris (HCC) 04/08/2014   Coronary angiogram 05/03/2014:  Proximal LAD 3.0x12 mm Promus premier stent.  04/08/2014: Mid Cx 3.5 x 16 mm Promus DES.  03/29/2013: Mid LAD 2.75 x 38 mm Promus Premier drug-eluting stent, balloon angioplasty of D1 and distal LAD and stenting of distal RCA with 2.75 x 24 mm promos Premier drug-eluting stent 12/15/2012 widely patent.    Past Surgical History:  Procedure Laterality Date  . CARDIAC CATHETERIZATION N/A 09/25/2015   Procedure: Left Heart Cath and Coronary Angiography;  Surgeon: 09/27/2015, MD;  Location: [facility] INVASIVE CV LAB;  Service: Cardiovascular;  Laterality: N/A;  . CARDIAC CATHETERIZATION N/A 09/25/2015   Procedure: Intravascular Pressure Wire/FFR Study;  Surgeon: Adrian Prows, MD;  Location: [place] CV LAB;  Service: Cardiovascular;  Laterality: N/A;  . CORONARY ANGIOPLASTY WITH STENT PLACEMENT  12/15/2012; 04/28/2014; 05/03/2014   "2; 1; 1"   . FRACTIONAL FLOW RESERVE WIRE Right 03/26/2013   Procedure: FRACTIONAL FLOW RESERVE WIRE;  Surgeon: Laverda Page, MD;  Location: [facility] CATH LAB;  Service: Cardiovascular;  Laterality: Right;  . FRACTIONAL FLOW RESERVE WIRE N/A 05/03/2014   Procedure: FRACTIONAL FLOW RESERVE WIRE;  Surgeon: Laverda Page, MD;  Location: [facility] CATH LAB;  Service: Cardiovascular;  Laterality: N/A;  . HERNIA REPAIR  4627   "umbilical"   . INCISION AND DRAINAGE ABSCESS Left 05/2007   "groin"   . KNEE SURGERY Right 1992;  2000   "calcium deposits removed"  (12/15/2012)  . LEFT HEART CATH AND CORONARY ANGIOGRAPHY N/A 05/23/2017   Procedure: LEFT HEART CATH AND CORONARY ANGIOGRAPHY;  Surgeon: Adrian Prows, MD;  Location: [place] CV LAB;  Service: Cardiovascular;  Laterality: N/A;  . LEFT HEART CATHETERIZATION WITH CORONARY ANGIOGRAM N/A 12/15/2012   Procedure: LEFT HEART CATHETERIZATION WITH CORONARY ANGIOGRAM;  Surgeon: Laverda Page, MD;  Location: [facility] CATH LAB;  Service: Cardiovascular;  Laterality: N/A;  . LEFT HEART CATHETERIZATION WITH CORONARY ANGIOGRAM N/A 03/26/2013   Procedure: LEFT HEART CATHETERIZATION WITH CORONARY ANGIOGRAM;  Surgeon: Laverda Page, MD;  Location: [facility] CATH LAB;  Service: Cardiovascular;  Laterality: N/A;  . LEFT HEART CATHETERIZATION WITH CORONARY ANGIOGRAM N/A 04/08/2014   Procedure: LEFT HEART CATHETERIZATION WITH CORONARY ANGIOGRAM;  Surgeon: Blane Ohara, MD;  Location: [facility] CATH LAB;  Service: Cardiovascular;  Laterality: N/A;  . PERCUTANEOUS CORONARY STENT INTERVENTION (PCI-S) N/A 05/03/2014   Procedure: PERCUTANEOUS CORONARY STENT INTERVENTION (PCI-S);  Surgeon: Laverda Page, MD;  Location: [facility] CATH LAB;  Service: Cardiovascular;  Laterality: N/A;  . UMBILICAL HERNIA REPAIR      Allergies  Allergen Reactions  . Lyrica [Pregabalin] Other (See Comments)    Made patient very lethargic the next morning, very hard to patient to function, move, etc.   . Statins Other (See Comments)    Causes Restless Legs, rhabdomyolysis  . Metformin And Related     GI upset  . Trulicity [Dulaglutide] Other (See Comments)    GI side effects     Outpatient Encounter Medications as of 06/17/2019  Medication Sig  . acetaminophen (TYLENOL) 500 MG tablet Take 500-1,000 mg by mouth every 6 (six) hours as needed (for pain/headaches.).  Marland Kitchen albuterol (PROVENTIL HFA;VENTOLIN HFA) 108 (90 Base) MCG/ACT inhaler Inhale 2 puffs into the lungs every 6 (six) hours as needed for wheezing or shortness of breath.  . Alirocumab (PRALUENT)  75 MG/ML SOAJ Inject 75 mg into the skin every 14 (fourteen) days.  Marland Kitchen aspirin 81 MG EC tablet Take 1 tablet (81 mg total) by mouth daily.  . BD PEN NEEDLE NANO U/F 32G X 4 MM MISC USE TWICE DAILY WITH LANTUS PEN  . benzonatate (TESSALON) 200 MG capsule TAKE 1 CAPSULE BY MOUTH EVERY DAY 3 TIMES A DAY AS NEEDED FOR COUGH  . carvedilol (COREG) 3.125 MG tablet Take 1 tablet (3.125 mg total) by mouth 2 (two) times daily with a meal.  . clopidogrel (PLAVIX) 75 MG tablet Take 1 tablet (75 mg total) by mouth daily.  . Continuous Blood Gluc Receiver (FREESTYLE LIBRE 14 DAY READER) DEVI Inject 1 Device as directed daily as needed. Use once daily as direct to check blood sugar E11.51  . Continuous Blood Gluc Sensor (FREESTYLE  LIBRE 14 DAY SENSOR) MISC Use as directed to check blood sugar as needed. E11.51  . D-5000 125 MCG (5000 UT) TABS TAKE 1 TABLET BY MOUTH EVERY DAY  . dextrose (GLUTOSE) 40 % GEL Take 37.5 g by mouth once as needed for up to 1 dose for low blood sugar.  . famotidine (PEPCID) 20 MG tablet TAKE 1 TABLET BY MOUTH EVERY DAY  . fluticasone (FLONASE) 50 MCG/ACT nasal spray Place 1 spray into both nostrils daily as needed for allergies or rhinitis.  Marland Kitchen. gabapentin (NEURONTIN) 100 MG capsule Take 100 mg by mouth 2 (two) times daily.  . insulin aspart (NOVOLOG FLEXPEN) 100 UNIT/ML FlexPen Based on carbohydrate intake  . Insulin Glargine (BASAGLAR KWIKPEN) 100 UNIT/ML Inject 0.3 mLs (30 Units total) into the skin at bedtime.  Marland Kitchen. LIFESCAN FINEPOINT LANCETS MISC Use to test for blood sugar three times daily dx: E11.51  . metFORMIN (GLUCOPHAGE) 1000 MG tablet TAKE 1 TABLET BY MOUTH 2 TIMES DAILY WITH A MEAL  . metFORMIN (GLUCOPHAGE) 500 MG tablet Take 500 mg by mouth 2 (two) times daily.  . nitroGLYCERIN (NITROSTAT) 0.4 MG SL tablet PLACE 1 TABLET (0.4 MG TOTAL) UNDER THE TONGUE EVERY 5 (FIVE) MINUTES AS NEEDED FOR CHEST PAIN.  Marland Kitchen. ONETOUCH ULTRA test strip USE TO TEST FOR BLOOD SUGAR THREE TIMES DAILY  DX: E11.51  . pantoprazole (PROTONIX) 40 MG tablet Take 40 mg by mouth daily.  . pramipexole (MIRAPEX) 1 MG tablet Take 1 mg by mouth. 1 tablet in the morning and 1 tablet at bedtime  . valsartan (DIOVAN) 80 MG tablet Take 1 tablet (80 mg total) by mouth daily.  . [DISCONTINUED] gabapentin (NEURONTIN) 100 MG capsule Take 300-400 mg by mouth at bedtime.    No facility-administered encounter medications on file as of 06/17/2019.    Review of Systems:  Review of Systems  Constitutional: Negative for chills, fever and malaise/fatigue.  HENT: Negative for congestion.        Hoarseness  Eyes: Negative for blurred vision.  Respiratory: Positive for shortness of breath. Negative for cough, hemoptysis, sputum production and wheezing.   Cardiovascular: Negative for chest pain, palpitations and leg swelling.  Gastrointestinal: Negative for abdominal pain.  Genitourinary: Negative for dysuria.  Musculoskeletal: Negative for falls, joint pain and myalgias.  Skin: Negative for itching and rash.  Neurological: Positive for tremors. Negative for dizziness and loss of consciousness.  Endo/Heme/Allergies: Bruises/bleeds easily.  Psychiatric/Behavioral: Negative for depression and memory loss. The patient is not nervous/anxious and does not have insomnia.     Health Maintenance  Topic Date Due  . OPHTHALMOLOGY EXAM  03/20/2019  . PNA vac Low Risk Adult (2 of 2 - PPSV23) 03/14/2020 (Originally 12/02/2014)  . INFLUENZA VACCINE  10/03/2019  . HEMOGLOBIN A1C  12/14/2019  . FOOT EXAM  03/14/2020  . COLONOSCOPY  10/18/2025  . TETANUS/TDAP  03/02/2027  . Hepatitis C Screening  Completed    Physical Exam: Vitals:   06/17/19 0726  BP: (!) 142/82  Pulse: 96  Temp: (!) 97.3 F (36.3 C)  TempSrc: Temporal  SpO2: 99%  Weight: 181 lb 8 oz (82.3 kg)  Height: 5\' 8"  (1.727 m)   Body mass index is 27.6 kg/m. Physical Exam Vitals reviewed.  Constitutional:      Appearance: Normal appearance.  HENT:      Head: Normocephalic and atraumatic.     Mouth/Throat:     Pharynx: Oropharynx is clear.  Cardiovascular:     Rate and Rhythm: Normal  rate and regular rhythm.  Pulmonary:     Effort: Pulmonary effort is normal.     Breath sounds: Normal breath sounds.  Abdominal:     General: Bowel sounds are normal.  Musculoskeletal:        General: Normal range of motion.     Right lower leg: No edema.     Left lower leg: No edema.  Skin:    General: Skin is warm and dry.  Neurological:     General: No focal deficit present.     Mental Status: He is alert and oriented to person, place, and time. Mental status is at baseline.  Psychiatric:        Mood and Affect: Mood normal.        Behavior: Behavior normal.        Thought Content: Thought content normal.        Judgment: Judgment normal.     Labs reviewed: Basic Metabolic Panel: Recent Labs    04/26/19 0530 04/27/19 0237 04/27/19 0337 04/28/19 0332 04/29/19 0134 05/01/19 0344 05/04/19 1022 06/14/19 0807  NA 135   < >  --  136   < > 136 135 139  K 4.0   < >  --  4.6   < > 4.9 4.2 4.4  CL 100   < >  --  102   < > 100 99 102  CO2 28   < >  --  24   < > 25 25 29   GLUCOSE 84   < >  --  151*   < > 201* 152* 135*  BUN 21   < >  --  22   < > 26* 30* 13  CREATININE 1.28*   < >  --  1.07   < > 0.93 1.23 0.96  CALCIUM 7.9*   < >  --  8.2*   < > 8.4* 8.3* 9.1  MG 1.9  --  1.7 2.1  --   --   --   --   PHOS 3.2  --  2.4* 4.1  --   --   --   --    < > = values in this interval not displayed.   Liver Function Tests: Recent Labs    04/29/19 0134 04/30/19 0200 05/01/19 0344  AST 16 14* 17  ALT 23 21 25   ALKPHOS 61 68 61  BILITOT 0.7 0.7 0.5  PROT 5.7* 5.7* 5.6*  ALBUMIN 2.3* 2.3* 2.5*   No results for input(s): LIPASE, AMYLASE in the last 8760 hours. No results for input(s): AMMONIA in the last 8760 hours. CBC: Recent Labs    04/30/19 0200 04/30/19 0200 05/01/19 0344 05/04/19 1022 06/14/19 0807  WBC 8.5   < > 7.8 9.0 4.5   NEUTROABS 7.8*  --  6.7  --  2,925  HGB 12.7*   < > 12.5* 13.3 13.1*  HCT 37.5*   < > 37.1* 40.7 39.3  MCV 89.1   < > 88.8 91.9 89.9  PLT 94*   < > 109* 134* 129*   < > = values in this interval not displayed.   Lipid Panel: Recent Labs    03/09/19 0832 04/14/19 0540 06/14/19 0807  CHOL 166  --  162  HDL 37*  --  31*  LDLCALC 06/12/19*  --  106*  TRIG 159* 41 142  CHOLHDL 4.5  --  5.2*   Lab Results  Component Value Date   HGBA1C 7.6 (H)  06/14/2019    Procedures since last visit: PCV ECHOCARDIOGRAM COMPLETE  Result Date: 06/08/2019 Echocardiogram 06/08/2019: Left ventricle cavity is normal in size. Mild concentric hypertrophy of the left ventricle. Normal global wall motion. Normal LV systolic function with EF 57%. Doppler evidence of grade I (impaired) diastolic dysfunction, normal LAP. Trileaflet aortic valve.  Moderate (Grade II) aortic regurgitation. Mild to moderate tricuspid regurgitation. Mild pulmonary hypertension. Estimated pulmonary artery systolic pressure is 39 mmHg. Compared to previous study in 2017, aortic regurgitation severity increased from mild to moderate. Mild PH is new.   PCV MYOCARDIAL PERFUSION WITH LEXISCAN  Result Date: 05/31/2019 Lexiscan Tetrofosmin stress test 05/31/2019: No previous exam available for comparison. Lexiscan nuclear stress test performed using 1-day protocol. Stress EKG is non-diagnostic, as this is pharmacological stress test. In addition, stress EKG at 85% MPHR shows sinus tachycardia, no ischemic changes. Myocardial perfusion imaging is normal. Left ventricular ejection fraction is  71% with normal wall motion. Low risk study.    Assessment/Plan 1. 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 Pure hypercholesterolemia -remains elevated above 100 LDL with goal less than 70 at least with his dm and cad - LDL cholesterol, direct done for Dr. [name] - Hepatic function panel done for Dr. [name] -the other labs he ordered have just been done here at [facility]  3. Coronary artery disease of native artery of native heart with stable angina pectoris (HCC) -is having increased dyspnea after climbing stairs -he had his stress test which was low risk; echo did show mild pulmonary htn and some moderate valvular disease--he has f/u with Dr. [name] later this month to review results and discuss his dyspnea on exertion  4. Essential hypertension -bp mildly elevated this am (better than last visit) -will monitor at this time--may need adjustment if again elevated when he goes to cardiology  5. Simple chronic bronchitis (HCC) -could be source of his dyspnea, as well, as could post-covid effects, but they seem quite prolonged now  6. Hoarseness -gradually improving post-covid, but he's still unable to sing and referral was placed b/c visits to Dr. Erroll Luna at [facility] who he saw many years ago at [facility] for similar concerns  7. Mild pulmonary hypertension (HCC) -noted on echo he just had with Dr. Donnald Garre briefly to him, but will defer full explanation to cardiology  Labs/tests ordered:   Lab Orders     LDL cholesterol, direct     Hepatic function panel     CBC with Differential/Platelet     COMPLETE METABOLIC PANEL WITH GFR     Lipid panel     Hemoglobin A1c  Next appt:  10/21/2019 med mgt, fasting labs before  Karrissa Parchment L. Clance Baquero, D.O. Geriatrics [facility] [address] [place], [place] [address] Cell Phone (Mon-Fri 8am-5pm):  [phone] On Call:  [phone] & follow prompts after 5pm & weekends Office Phone:  [phone] Office Fax:  [phone]

## 2019-06-17 NOTE — Patient Instructions (Addendum)
Referral to Dr Jacalyn Lefevre- ENT  [facility] [phone]  Thank you for choosing [facility] for your primary care needs!  You may receive a survey--please complete this and let us know how we did today.

## 2019-06-18 LAB — HEPATIC FUNCTION PANEL
AG Ratio: 1.6 (calc) (ref 1.0–2.5)
ALT: 15 U/L (ref 9–46)
AST: 17 U/L (ref 10–35)
Albumin: 3.9 g/dL (ref 3.6–5.1)
Alkaline phosphatase (APISO): 50 U/L (ref 35–144)
Bilirubin, Direct: 0.1 mg/dL (ref 0.0–0.2)
Globulin: 2.4 g/dL (calc) (ref 1.9–3.7)
Indirect Bilirubin: 0.4 mg/dL (calc) (ref 0.2–1.2)
Total Bilirubin: 0.5 mg/dL (ref 0.2–1.2)
Total Protein: 6.3 g/dL (ref 6.1–8.1)

## 2019-06-18 LAB — LDL CHOLESTEROL, DIRECT: Direct LDL: 110 mg/dL — ABNORMAL HIGH (ref <100)

## 2019-06-21 ENCOUNTER — Encounter: Payer: Self-pay | Admitting: *Deleted

## 2019-06-21 ENCOUNTER — Other Ambulatory Visit: Payer: Self-pay | Admitting: *Deleted

## 2019-06-21 NOTE — Patient Outreach (Signed)
[name]) Care Management [place] Telephone Outreach Post-hospital discharge day # 51 without unplanned hospital re-admission  06/21/2019  Kenneth Schor Sr. 1950/06/27 [id number]  Successful telephone outreach to [name], 69 y/o male referred to [facility] RN CM 05/04/19 by [facility] liaison after 2 recent hospitalizations February 10-14, 2021 for COVID-19 associated pneumonia and then again February 21-27, 2021 for sepsis with hypotension related ongoing complications of BTDVV-61 infection. Patient also had 2 recent ED visits, and was discharged home. Patient was discharged from most recent inpatient hospitalization to self-care without home health services in place. Patient has history including, but not limited to, DM-II with recent A1-C of 9.0; HTN/ HLD; CAD; and GERD.  HIPAA/ identity verified; patient reports "doing fine today;" and he denies pain, clinical concerns and new/ recent falls; again reports he believes he is "slowly" getting better, although he complains intermittent ongoing weakness/ lack of energy.  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   Patient sounds to be in no distress throughout call today.  Patient further reports:  -- attended recent PCP office visit 06/17/19; "got a good report;" reviewed with patient changes to medications post- recent PCP office visit and upcoming scheduled provider appointments (cardiology 06/23/19); waiting to hear from ENT to schedule appointment for ongoing hoarseness post- recent COVID infection; patient confirms that he is aware of all and plans to attend all as scheduled- continues to drive self  -- reviewed recent changes to medications post- most recent A1-C of 7.6 (06/14/19)-- this is decreased from last value of 9.2 (January 2021)-- positive reinforcement provided;  nutritional assessment completed- no concerns identified  Self-health management ofDM: -- discussed/ reviewed recent blood sugars at home: patient using continuous glucose monitoring system at home  -- fasting blood sugar ranges reported as 100-170/ post-prandial blood sugar ranges reported as 170-200 -- fasting blood sugar this morning: 95; 4 hours after breakfast: 197 -- ongoing occasional episodes low blood sugars "mainly in the middle of the night;" patient has well-established action plan for low blood sugars and keeps peanut butter and orange juice available at all times; discussed importance of following action plan, especially now that he has resumed working as a Administrator.  Continues to report that he is able to physically tell from signs/ symptoms when his blood sugars fall  Patient denies further issues, concerns, or problems today. Iconfirmed that patient hasmy direct phone number, the main [facility] CM office phone number, and the [facility] CM 24-hour nurse advice phone number should issues arise prior to next scheduled [facility] CM outreachnext month. Encouraged patient to contact me directly if needs, questions, issues, or concerns arise prior to next scheduled outreach; patient agreed to do so.  Plan:  Patient will take medications as prescribed and will attend all scheduled provider appointments  Patient will promptly notify care providers for any new concerns/ issues/ problems that arise  Patient will continue monitoring/ recordingblood sugars at least TIDand will follow established action plan for episodes hypoglycemia  [facility] CM outreach to continue with scheduled phone callnext month, sooner if indicated  [facility] CM Care Plan Problem One     Most Recent Value  Care Plan Problem One  High risk for hospital readmission related to/ as evidenced by multiple recent inpatient hospitalizations and ER visits due to complications from YWVPX-10  Role  Documenting the Problem One   Care Management Coordinator  Care Plan for Problem One  Not Active  [facility] Long Term Goal   Over the next 31 days, patient will not experience hospital re-admission as evidenced by patient reporting and review of EMR during [facility] RN CM outreach  [facility] Long Term Goal Start Date  05/18/19  [facility] Long Term Goal Met Date  06/21/19 [facility] Met]  Interventions for Problem One Long Term Goal  Discussed with patient his current clinical condition and confirmed that he has no current clinical concerns,  confirmed that patient has not had any further unplanned hospitalizations and reviewed with him recent changes to medications post- recent PCP office visit  [facility] CM Short Term Goal #1   Over the next 30 days, patient will attend all scheduled provider appointments, as evidenced by patient reporting and collaboration with care providers as indicated during [facility] RN CM outreach  [facility] CM Short Term Goal #1 Start Date  05/18/19  [facility] CM Short Term Goal #1 Met Date  06/21/19 [Goal Met]  Interventions for Short Term Goal #1  Reviewed with patient his recent office visit with his PCP,  confirmed that he continues to drive self and has started back to work,  reviewed upcoming provider appointments and confirmed that patient has accurate understanding of upcoming specialty provider office visits, along with plans to attend all as scheduled  [facility] CM Short Term Goal #2   Over the next 30 days, patient will begin using his newly prescribed CGM and will continue monitoring and recording blood sugars at home three times per day, as evidenced by patient reporting and review of same during [place] outreach  [facility] CM Short Term Goal #2 Start Date  05/18/19  [facility] CM Short Term Goal #2 Met Date  06/21/19 [Goal Met]  Interventions for Short Term Goal #2  Confirmed that patient has obtained and is using his CGM [employer]),  reviewed with patient his recent blood sugars at home,  confirmed that patient shared with PCP his ongoing night time  episodes low blood sugar,  discussed action plan for low blood sugar and confirmed that patient has appropriate action plan for hypoglycemia    [facility] CM Care Plan Problem Two     Most Recent Value  Care Plan Problem Two  Self- health management of chronic disease state of DM, as evidenced by patient reporting  Role Documenting the Problem Two  Care Management Coordinator  Care Plan for Problem Two  Active  Interventions for Problem Two Long Term Goal   Reviewed with patient recent blood sugars at home,  confirmed that patient shared recent episodes of night time hypoglycemia with PCP,  using teach back method, discussed with patient signs/ symptoms and action plan for hypoglycemia,  reviewed current medications for DM with patient and most recent A1-C level,  positive reinforcement provided to patient for decrease in A1-C level  [facility] Long Term Goal  Over the next 60 days, patient will continue to monitor and record daily blood sugars 3-4 times daily using CGM, as evidenced by review of same with patient during [facility] RN CM outreach  [facility] Long Term Goal Start Date  06/21/19  [facility] CM Short Term Goal #1   Over the next 30 days, patient will designate on his calendar frequency of episodes of hypoglycemia, as evidenced by patient reporting during [facility] RN CM outreach  [facility] CM Short Term Goal #1 Start Date  06/21/19  Interventions for Short Term  Goal #2   Discussed with patient value of being able to quantify frequency of hypoglycemic episodes,  discussed his established action plan for hypoglycemia and confirmed that he has appropriate action plan for episodes low blood sugar,  discussed with patient need to act quickly should he experience signs/ symptoms low blood sugar while driving/ working     Oneta Rack, Therapist, sports, Copywriter, advertising, [employer] [facility] Care Management  [phone]

## 2019-06-21 NOTE — Telephone Encounter (Signed)
Please call the [facility] office and find out who else Mr. Atlas can see for his hoarseness/dysphonia.  Pt learned that Dr. Erroll Luna had moved to [place] and is only dealing with head and neck cancer now not seeing patients with other throat issues like his hoarseness.  Thanks.

## 2019-06-21 NOTE — Telephone Encounter (Signed)
Patient calling stating that Dr. Manson Passey is now with a different clinic doing something with cancer, pt would still like to go to the same clinic. Not sure if a different referral needs to be put in

## 2019-06-22 ENCOUNTER — Telehealth: Payer: Self-pay | Admitting: Internal Medicine

## 2019-06-22 NOTE — Progress Notes (Signed)
Primary Physician/Referring:  Kermit Baloeed, Tiffany L, DO  Patient ID: Kenneth Sicalph Koren Sr., male    DOB: 1950/03/14, 69 y.o.   MRN: [id number]  Chief Complaint  Patient presents with  . Chest Pain  . Coronary Artery Disease  . Hypertension  . Follow-up    6 week   HPI:    Kenneth SicRalph Flaugher Sr.  is a 69 y.o. caucasian male with coronary artery disease, last cardiac catheterization on 05/29/2017 when he presented with unstable anginal-like symptoms revealing widely patent stent to his LAD, RCA and circumflex and also balloon angioplasty site to D1 and distal LAD.  Fortunately his last PCI has been in 2016.  Past medical history significant for hypertension, hyperlipidemia, GERD, Barrett's esophagus, statin intolerance, uncontrolled diabetes mellitus and also severe restless leg syndrome.  He was admitted to the hospital after Covid 19 infection on 04/09/2019 and was again readmitted on 05/01/2019 and received remdesivir and also Decadron.  He underwent stress testing and presents for follow-up, he has not had any recurrence of chest pain and states that he is finally settled down and feels well except for fatigue and mild dyspnea on exertion and episodes of paroxysms of cough.  Past Medical History:  Diagnosis Date  . Arthritis    "left thumb" (05/23/2017)  . Barrett's esophagus    "we've been told that it's all gone; still take RX for GERD" (05/23/2017)  . CAD (coronary artery disease), native coronary artery    Coronary angiogram 05/03/2014:  Proximal LAD 3.0x12 mm Promus premier stent.  04/08/2014: Mid Cx 3.5 x 16 mm Promus DES.  03/29/2013: Mid LAD 2.75 x 38 mm Promus Premier drug-eluting stent, balloon angioplasty of D1 and distal LAD and stenting of distal RCA with 2.75 x 24 mm promos Premier drug-eluting stent 12/15/2012 widely patent.  . Cervicalgia   . Chronic bronchitis (HCC)    "q yr" (05/23/2017)  . Complication of anesthesia    " I do not wake up very well "  . COVID-19   . Family history of adverse  reaction to anesthesia    "son w/PONV"  . GERD (gastroesophageal reflux disease)   . Heart murmur    "grew out of it"   . Hyperlipidemia   . Hypoglycemia, unspecified   . IDDM (insulin dependent diabetes mellitus)   . Impotence of organic origin   . Iron deficiency anemia   . Other malaise and fatigue   . PONV (postoperative nausea and vomiting)   . Restless leg   . Tension headache    "sometimes" (05/23/2017)  . Unspecified gastritis and gastroduodenitis with hemorrhage   . Unstable angina pectoris (HCC) 04/08/2014   Coronary angiogram 05/03/2014:  Proximal LAD 3.0x12 mm Promus premier stent.  04/08/2014: Mid Cx 3.5 x 16 mm Promus DES.  03/29/2013: Mid LAD 2.75 x 38 mm Promus Premier drug-eluting stent, balloon angioplasty of D1 and distal LAD and stenting of distal RCA with 2.75 x 24 mm promos Premier drug-eluting stent 12/15/2012 widely patent.   Past Surgical History:  Procedure Laterality Date  . CARDIAC CATHETERIZATION N/A 09/25/2015   Procedure: Left Heart Cath and Coronary Angiography;  Surgeon: Yates DecampJay Kathelene Rumberger, MD;  [facility];  Service: Cardiovascular;  Laterality: N/A;  . CARDIAC CATHETERIZATION N/A 09/25/2015   Procedure: Intravascular Pressure Wire/FFR Study;  Surgeon: Yates DecampJay Douglas Rooks, MD;  [facility];  Service: Cardiovascular;  Laterality: N/A;  . CORONARY ANGIOPLASTY WITH STENT PLACEMENT  12/15/2012; 04/28/2014; 05/03/2014   "2; 1; 1"   .  FRACTIONAL FLOW RESERVE WIRE Right 03/26/2013   Procedure: FRACTIONAL FLOW RESERVE WIRE;  Surgeon: Pamella Pert, MD;  Location: [facility] CATH LAB;  Service: Cardiovascular;  Laterality: Right;  . FRACTIONAL FLOW RESERVE WIRE N/A 05/03/2014   Procedure: FRACTIONAL FLOW RESERVE WIRE;  Surgeon: Pamella Pert, MD;  Location: [facility] CATH LAB;  Service: Cardiovascular;  Laterality: N/A;  . HERNIA REPAIR  2010   "umbilical"   . INCISION AND DRAINAGE ABSCESS Left 05/2007   "groin"   . KNEE SURGERY Right 1992;  2000   "calcium  deposits removed" (12/15/2012)  . LEFT HEART CATH AND CORONARY ANGIOGRAPHY N/A 05/23/2017   Procedure: LEFT HEART CATH AND CORONARY ANGIOGRAPHY;  Surgeon: [name], MD;  [facility] LAB;  Service: Cardiovascular;  Laterality: N/A;  . LEFT HEART CATHETERIZATION WITH CORONARY ANGIOGRAM N/A 12/15/2012   Procedure: LEFT HEART CATHETERIZATION WITH CORONARY ANGIOGRAM;  Surgeon: Pamella Pert, MD;  Location[facility] CATH LAB;  Service: Cardiovascular;  Laterality: N/A;  . LEFT HEART CATHETERIZATION WITH CORONARY ANGIOGRAM N/A 03/26/2013   Procedure: LEFT HEART CATHETERIZATION WITH CORONARY ANGIOGRAM;  Surgeon: Pamella Pert, MD;  Location: [facility] CATH LAB;  Service: Cardiovascular;  Laterality: N/A;  . LEFT HEART CATHETERIZATION WITH CORONARY ANGIOGRAM N/A 04/08/2014   Procedure: LEFT HEART CATHETERIZATION WITH CORONARY ANGIOGRAM;  Surgeon: Micheline Chapman, MD;  Location: [facility] CATH LAB;  Service: Cardiovascular;  Laterality: N/A;  . PERCUTANEOUS CORONARY STENT INTERVENTION (PCI-S) N/A 05/03/2014   Procedure: PERCUTANEOUS CORONARY STENT INTERVENTION (PCI-S);  Surgeon: Pamella Pert, MD;  Location: [facility] CATH LAB;  Service: Cardiovascular;  Laterality: N/A;  . UMBILICAL HERNIA REPAIR     Social History   Tobacco Use  . Smoking status: Never Smoker  . Smokeless tobacco: Never Used  Substance Use Topics  . Alcohol use: No     ROS  Review of Systems  Constitution: Positive for malaise/fatigue.  Cardiovascular: Positive for dyspnea on exertion.  Respiratory: Positive for cough.   Musculoskeletal: Positive for muscle cramps (restless leg).   Objective   Vitals with BMI 06/23/2019 06/17/2019 05/31/2019  Height 5\' 8"  5\' 8"  -  Weight 180 lbs 181 lbs 8 oz -  BMI 27.38 27.6 -  Systolic 128 142  Diastolic 80 82 80  Pulse 76 96 -    Blood pressure 128/80, pulse 76, temperature 97.6 F (36.4 C), temperature source Temporal, resp. rate 16, height 5\' 8"  (1.727 m), weight 180 lb (81.6 kg),  SpO2 96 %. Body mass index is 27.37 kg/m.   Physical Exam  Constitutional: He appears well-developed and well-nourished.  Cardiovascular: Normal rate, regular rhythm and normal heart sounds. Exam reveals no gallop.  No murmur heard. Pulses:      Carotid pulses are 2+ on the right side and 2+ on the left side.      Femoral pulses are 2+ on the right side and 2+ on the left side.      Dorsalis pedis pulses are 1+ on the right side and 1+ on the left side.       Posterior tibial pulses are 1+ on the right side and 1+ on the left side.  1-2+ ankle edema.  There is no JVD.  Pulmonary/Chest: Effort normal and breath sounds normal.  Abdominal: Soft. Bowel sounds are normal.  Musculoskeletal:        General: Normal range of motion.   Radiology: No results found.  Laboratory examination:   Recent Labs    04/30/19 0200 04/30/19  0200 05/01/19 0344 05/04/19 1022 06/14/19 0807  NA 136   < > 136 135 139  K 5.0   < > 4.9 4.2 4.4  CL 103   < > 100 99 102  CO2 24   < > 25 25 29   GLUCOSE 247*   < > 201* 152* 135*  BUN 27*   < > 26* 30* 13  CREATININE 0.98   < > 0.93 1.23 0.96  CALCIUM 8.2*   < > 8.4* 8.3* 9.1  GFRNONAA >60  --  >60 60*  --   GFRAA >60  --  >60 >60  --    < > = values in this interval not displayed.   CMP Latest Ref Rng & Units 06/17/2019 06/14/2019 05/04/2019  Glucose 65 - 99 mg/dL - 07/04/2019) 841(L)  BUN 7 - 25 mg/dL - 13 244(W)  Creatinine 0.70 - 1.25 mg/dL - 10(U 7.25  Sodium 3.66 - 146 mmol/L - 139 135  Potassium 3.5 - 5.3 mmol/L - 4.4 4.2  Chloride 98 - 110 mmol/L - 102 99  CO2 20 - 32 mmol/L - 29 25  Calcium 8.6 - 10.3 mg/dL - 9.1 440)  Total Protein 6.1 - 8.1 g/dL 6.3 - -  Total Bilirubin 0.2 - 1.2 mg/dL 0.5 - -  Alkaline Phos 38 - 126 U/L - - -  AST 10 - 35 U/L 17 - -  ALT 9 - 46 U/L 15 - -   CBC Latest Ref Rng & Units 06/14/2019 05/04/2019 05/01/2019  WBC 3.8 - 10.8 Thousand/uL 4.5 9.0 7.8  Hemoglobin 13.2 - 17.1 g/dL 13.1(L) 13.3 12.5(L)  Hematocrit 38.5 -  50.0 % 39.3 40.7 37.1(L)  Platelets 140 - 400 Thousand/uL 129(L) 134(L) 109(L)   Lipid Panel     Component Value Date/Time   CHOL 162 06/14/2019 0807   CHOL 86 (L) 07/03/2015 0816   TRIG 142 06/14/2019 0807   HDL 31 (L) 06/14/2019 0807   HDL 37 (L) 07/03/2015 0816   CHOLHDL 5.2 (H) 06/14/2019 0807   VLDL 21 08/27/2016 0849   LDLCALC 106 (H) 06/14/2019 0807   LDLDIRECT 110 (H) 06/17/2019 0816   HEMOGLOBIN A1C Lab Results  Component Value Date   HGBA1C 7.6 (H) 06/14/2019   MPG 171 06/14/2019   TSH No results for input(s): TSH in the last 8760 hours. Medications and allergies   Allergies  Allergen Reactions  . Lyrica [Pregabalin] Other (See Comments)    Made patient very lethargic the next morning, very hard to patient to function, move, etc.   . Statins Other (See Comments)    Causes Restless Legs, rhabdomyolysis  . Metformin And Related     GI upset  . Trulicity [Dulaglutide] Other (See Comments)    GI side effects     Current Outpatient Medications  Medication Instructions  . acetaminophen (TYLENOL) 500-1,000 mg, Oral, Every 6 hours PRN  . albuterol (PROVENTIL HFA;VENTOLIN HFA) 108 (90 Base) MCG/ACT inhaler 2 puffs, Inhalation, Every 6 hours PRN  . aspirin 81 mg, Oral, Daily  . Basaglar KwikPen 30 Units, Subcutaneous, Daily at bedtime  . BD PEN NEEDLE NANO U/F 32G X 4 MM MISC USE TWICE DAILY WITH LANTUS PEN  . benzonatate (TESSALON) 200 MG capsule TAKE 1 CAPSULE BY MOUTH EVERY DAY 3 TIMES A DAY AS NEEDED FOR COUGH  . carvedilol (COREG) 3.125 mg, Oral, 2 times daily with meals  . clopidogrel (PLAVIX) 75 mg, Oral, Daily  . Continuous Blood Gluc Receiver (FREESTYLE Bandana  14 DAY READER) DEVI 1 Device, Injection, Daily PRN, Use once daily as direct to check blood sugar E11.51  . Continuous Blood Gluc Sensor (FREESTYLE LIBRE 14 DAY SENSOR) MISC Use as directed to check blood sugar as needed. E11.51  . D-5000 125 MCG (5000 UT) TABS TAKE 1 TABLET BY MOUTH EVERY DAY  .  dextrose (GLUTOSE) 37.5 g, Oral, Once PRN  . famotidine (PEPCID) 20 MG tablet TAKE 1 TABLET BY MOUTH EVERY DAY  . fluticasone (FLONASE) 50 MCG/ACT nasal spray 1 spray, Each Nare, Daily PRN  . gabapentin (NEURONTIN) 100 mg, Oral, 2 times daily  . insulin aspart (NOVOLOG FLEXPEN) 100 UNIT/ML FlexPen Based on carbohydrate intake  . LIFESCAN FINEPOINT LANCETS MISC Use to test for blood sugar three times daily dx: E11.51   . metFORMIN (GLUCOPHAGE) 1000 MG tablet TAKE 1 TABLET BY MOUTH 2 TIMES DAILY WITH A MEAL  . nitroGLYCERIN (NITROSTAT) 0.4 mg, Sublingual, Every 5 min PRN  . ONETOUCH ULTRA test strip USE TO TEST FOR BLOOD SUGAR THREE TIMES DAILY DX: E11.51  . pantoprazole (PROTONIX) 40 mg, Oral, Daily  . Praluent 75 mg, Subcutaneous, Every 14 days  . pramipexole (MIRAPEX) 1 mg, Oral, 1 tablet in the morning and 1 tablet at bedtime  . valsartan (DIOVAN) 80 mg, Oral, Daily    Cardiac Studies:   Coronary angiogram 09/25/2015: Ostial Cx 50-60%, FFR 0.86. LVEF 60%. Patent stent placed 05/03/2014: Proximal LAD 3.0x12 mm Promus DES. 04/08/2014: Cx/OM1 with 3.5 x 16 mm Promus DES. H/O Mid LAD 2.75 x 38 mm Promus Premier DES, balloon angioplasty of D1 and distal LAD(03/29/2013) and Distal RCA stent (12/15/2012) with 2.75 x 24 mm promos Premier DES.  Lower extremity venous duplex for insufficiency 05/29/2017: Bilateral lower extremities negative for DVT or reflux.  Coronary angiogram 05/30/2019 and 05/23/2017: All stents placed previously are patent.  Proximal to mid LAD long stent (Overlaps prior stent) that was placed on 04/08/2014, circumflex/obtuse marginal-1 stenting. History of mid LAD stenting and balloon angioplasty to D1 and distal LAD performed on 03/29/2013, distal RCA stent in 2014. No change in ostial circumflex lesion, the mid LAD stent is widely patent however at the outflow, there is a 30-40% stenosis.  Lexiscan Tetrofosmin stress test 05/31/2019: No previous exam available for  comparison. Lexiscan nuclear stress test performed using 1-day protocol. Stress EKG is non-diagnostic, as this is pharmacological stress test. In addition, stress EKG at 85% MPHR shows sinus tachycardia, no ischemic changes.  Myocardial perfusion imaging is normal. Left ventricular ejection fraction is  71% with normal wall motion.  Low risk study.   Echocardiogram 06/08/2019: Left ventricle cavity is normal in size. Mild concentric hypertrophy of  the left ventricle. Normal global wall motion. Normal LV systolic function  with EF 57%. Doppler evidence of grade I (impaired) diastolic dysfunction,  normal LAP.  Trileaflet aortic valve. Moderate (Grade II) aortic regurgitation.  Mild to moderate tricuspid regurgitation.  Mild pulmonary hypertension. Estimated pulmonary artery systolic pressure  is 39 mmHg.  Compared to previous study in 2017, aortic regurgitation severity  increased from mild to moderate. Mild PH is new.   EKG:  EKG 05/04/2019: Normal sinus rhythm with rate of 63 bpm, left axis deviation, borderline criteria for LVH.  No evidence of ischemia.  Compared to 12/24/2018, inferior T wave abnormality not present.  Assessment     ICD-10-CM   1. Coronary artery disease of native artery of native heart with stable angina pectoris ([place])  I25.118   2. Essential hypertension  I10   3. Pure hypercholesterolemia  E78.00   4. Uncontrolled diabetes mellitus with circulatory complication, with long-term current use of insulin [facility])  E11.59    Z79.4    E11.65    Recommendations:   Proctor Carriker Sr.  is a 69 y.o. caucasian male with coronary artery disease, last cardiac catheterization on 05/29/2017 when he presented with unstable anginal-like symptoms revealing widely patent stent to his LAD, RCA and circumflex and also balloon angioplasty site to D1 and distal LAD.  Fortunately his last PCI has been in 2016.  Past medical history significant for hypertension, hyperlipidemia, GERD, Barrett's  esophagus, statin intolerance, uncontrolled diabetes mellitus and also severe restless leg syndrome.  Since COVID-19 pneumonia, he has fatigue and also mild dyspnea on exertion.  Do not suspect congestive heart failure.  He does have mild ankle edema may be related to inflammatory reaction.  I reviewed his stress test and also echocardiogram, fortunately low risk stress test and EF is preserved, aortic regurgitation is slightly progressed but clinically appears to be insignificant presently.    His labs reveal uncontrolled LDL, patient was off of Praluent since Covid 19 infection on 04/09/2019 but is presently back on it now but he has not been taking it regularly as pharmacy is having difficulty in obtaining following.  If this happens and what happened, we months ago seem to recover.  Will recheck lipids in 2-3 months he may need addition of Zetia if LDL is not controlled or addition of Nexlizet.  Continue Plavix which had started after his COVID-19 infection in view of atypical chest pain, also lipids not controlled.  Continue aspirin indefinitely.  Platelets continue to be low and this is chronic.  Diabetes continues to be uncontrolled and I have again discussed with him regarding his fondness for ice cream.  [name], MD, [facility] 06/23/2019, 10:29 AM [facility] Office: [phone]

## 2019-06-22 NOTE — Telephone Encounter (Signed)
Faxed referral to [facility] ENT, Head & Neck Dept [phone] [address] [phone]  Their office will call to schedule appt with pt  Thanks, Misty Stanley

## 2019-06-23 ENCOUNTER — Other Ambulatory Visit: Payer: Self-pay

## 2019-06-23 ENCOUNTER — Ambulatory Visit: Payer: Medicare Other | Admitting: Cardiology

## 2019-06-23 ENCOUNTER — Encounter: Payer: Self-pay | Admitting: Cardiology

## 2019-06-23 VITALS — BP 128/80 | HR 76 | Temp 97.6°F | Resp 16 | Ht 68.0 in | Wt 180.0 lb

## 2019-06-23 DIAGNOSIS — E1159 Type 2 diabetes mellitus with other circulatory complications: Secondary | ICD-10-CM | POA: Diagnosis not present

## 2019-06-23 DIAGNOSIS — I25118 Atherosclerotic heart disease of native coronary artery with other forms of angina pectoris: Secondary | ICD-10-CM

## 2019-06-23 DIAGNOSIS — I10 Essential (primary) hypertension: Secondary | ICD-10-CM

## 2019-06-23 DIAGNOSIS — E7800 Pure hypercholesterolemia, unspecified: Secondary | ICD-10-CM

## 2019-06-23 DIAGNOSIS — E1165 Type 2 diabetes mellitus with hyperglycemia: Secondary | ICD-10-CM | POA: Diagnosis not present

## 2019-06-23 DIAGNOSIS — I351 Nonrheumatic aortic (valve) insufficiency: Secondary | ICD-10-CM | POA: Diagnosis not present

## 2019-06-23 DIAGNOSIS — Z794 Long term (current) use of insulin: Secondary | ICD-10-CM | POA: Diagnosis not present

## 2019-06-23 DIAGNOSIS — IMO0002 Reserved for concepts with insufficient information to code with codable children: Secondary | ICD-10-CM

## 2019-07-06 ENCOUNTER — Other Ambulatory Visit: Payer: Self-pay | Admitting: Internal Medicine

## 2019-07-06 DIAGNOSIS — K219 Gastro-esophageal reflux disease without esophagitis: Secondary | ICD-10-CM

## 2019-07-06 NOTE — Telephone Encounter (Signed)
rx sent to pharmacy by e-script  

## 2019-07-21 ENCOUNTER — Other Ambulatory Visit: Payer: Self-pay | Admitting: *Deleted

## 2019-07-21 ENCOUNTER — Encounter: Payer: Self-pay | Admitting: *Deleted

## 2019-07-21 NOTE — Patient Outreach (Signed)
[facility]) Care Management [facility] CM Telephone Outreach Post-hospital discharge day # 80  07/21/2019  Kenneth Lav Sr. [birth year] [id number]  Successful telephone outreach to Kenneth Lav, 69 y/o male referred to [place] 05/04/19 by [facility] liaison after 2 recent hospitalizations February 10-14, 2021 for COVID-19 associated pneumonia and then again February 21-27, 2021 for sepsis with hypotension related ongoing complications of BWGYK-59 infection. Patient also had 2 subsequent ED visits, and was discharged home. Patient was discharged from most recent inpatient hospitalization to self-care without home health services in place. Patient has history including, but not limited to, DM-II with recent A1-C of 9.0; HTN/ HLD; CAD; and GERD.  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  Patient further reports:  -- no recent provider office visits; reviewed recent cardiology provider appointment with patient and confirmed that no changes were made to overall plan of care/ medications; patient verbalizes plans to contact ENT provider at [facility] to schedule appointment for evaluation of post- corona virus voice hoarseness; stated he plans to do soon around his work schedule; reports hoarseness has gradually improved, but continues intermittently ---- has scheduled upcoming PCP appointment in August; patient aware/ verbalizes plans to attend as scheduled- will drive self  -- no concerns/ issues/ problems around medications: continues self-managing  Self-health management ofDM: -- discussed/ reviewed recentblood sugars at home: patient continues using continuous glucose monitoring system at home  -- fasting blood sugar  ranges reported consistently between 90-130/ post-prandial blood sugar ranges consistently reported as 130-180 -- fasting blood sugar this morning: 92; reports having "less frequent" low fasting blood sugars; states he has been trying to eat snack during night time hours to avoid episodes of hypoglycemia -- previously reported occasional episodes low blood sugars "better, not happening as much;" verbalizes accurate understanding of signs/ symptoms hypoglycemia and confirms he is well aware of these episodes when they happen due to the symptoms he experiences ---- verbalizes appropriate action plan for episodes and confirms that he has started quantifying hypoglycemic episodes to share with PCP  ---- verbalizes appropriate dosing/ use of prescribed insulin/ oral medications for DM ---- using teach back method, previously provided education reiterated/ provided around management of blood sugars and hypoglycemic episodes  Patient denies further issues, concerns, or problems today. Iconfirmed that patient hasmy direct phone number, the main [facility] CM office phone number, and the [facility] CM 24-hour nurse advice phone number should issues arise prior to next scheduled [facility] CM outreachnext month. Encouraged patient to contact me directly if needs, questions, issues, or concerns arise prior to next scheduled outreach; patient agreed to do so.  Plan:  Patient will take medications as prescribed and will attend all scheduled provider appointments  Patient will promptly notify care providers for any new concerns/ issues/ problems that arise  Patient will continue monitoring/ recordingblood sugarsat leastTIDand will follow established action plan for episodes hypoglycemia  I will place printed educational material in mail to patient as reinforcement of self-health management of chronic disease state of DM  [facility] CM outreach to continue with scheduled phone callnext month, sooner if indicated  [facility] CM  Care Plan Problem Two     Most Recent Value  Care Plan Problem Two  Self- health management  of chronic disease state of DM, as evidenced by patient reporting  Role Documenting the Problem Two  Care Management [place] for Problem Two  Active  Interventions for Problem Two Long Term Goal   Confirmed that patient continues daily monitoring of blood sugars several times each day/ continues using CGM,  reviewed recent blood sugars with patient and confirmed that he has scheduled follow up office visit with PCP for next A1-C check   [facility] Long Term Goal  Over the next 60 days, patient will continue to monitor and record daily blood sugars 3-4 times daily using CGM, as evidenced by review of same with patient during [facility] RN CM outreach  [facility] Long Term Goal Start Date  06/21/19  [facility] CM Short Term Goal #1   Over the next 30 days, patient will designate on his calendar frequency of episodes of hypoglycemia, as evidenced by patient reporting during [facility] RN CM outreach  [facility] CM Short Term Goal #1 Start Date  06/21/19  [facility] CM Short Term Goal #1 Met Date   07/21/19 [Goal Met]  Interventions for Short Term Goal #2   Confirmed that patient has had fewer hypoglycemic episodes and has been recording/ quantifying these episodes on paper,  confirmed that patient continues able to accurately verbalize appropriate action plan for hypoglycemic episodes,  discussed with patient 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

## 2019-08-05 ENCOUNTER — Other Ambulatory Visit: Payer: Self-pay | Admitting: Cardiology

## 2019-08-05 DIAGNOSIS — I25118 Atherosclerotic heart disease of native coronary artery with other forms of angina pectoris: Secondary | ICD-10-CM

## 2019-08-05 DIAGNOSIS — E119 Type 2 diabetes mellitus without complications: Secondary | ICD-10-CM | POA: Diagnosis not present

## 2019-08-05 LAB — HM DIABETES EYE EXAM

## 2019-08-06 ENCOUNTER — Other Ambulatory Visit: Payer: Self-pay | Admitting: Internal Medicine

## 2019-08-06 DIAGNOSIS — Z20822 Contact with and (suspected) exposure to covid-19: Secondary | ICD-10-CM

## 2019-08-06 NOTE — Telephone Encounter (Signed)
rx sent to pharmacy by e-script  

## 2019-08-07 ENCOUNTER — Other Ambulatory Visit: Payer: Self-pay | Admitting: Internal Medicine

## 2019-08-07 DIAGNOSIS — Z20822 Contact with and (suspected) exposure to covid-19: Secondary | ICD-10-CM

## 2019-08-09 DIAGNOSIS — R490 Dysphonia: Secondary | ICD-10-CM | POA: Diagnosis not present

## 2019-08-09 DIAGNOSIS — J383 Other diseases of vocal cords: Secondary | ICD-10-CM | POA: Diagnosis not present

## 2019-08-09 DIAGNOSIS — R05 Cough: Secondary | ICD-10-CM | POA: Diagnosis not present

## 2019-08-09 DIAGNOSIS — J382 Nodules of vocal cords: Secondary | ICD-10-CM | POA: Diagnosis not present

## 2019-08-09 NOTE — Telephone Encounter (Signed)
rx sent to pharmacy by e-script  

## 2019-08-19 ENCOUNTER — Other Ambulatory Visit: Payer: Self-pay | Admitting: *Deleted

## 2019-08-19 DIAGNOSIS — IMO0002 Reserved for concepts with insufficient information to code with codable children: Secondary | ICD-10-CM

## 2019-08-19 MED ORDER — FREESTYLE LIBRE 14 DAY SENSOR MISC: 12 refills | Status: DC

## 2019-08-19 NOTE — Telephone Encounter (Signed)
Patient called requesting rx to be sent to Pharmacy. Stated that in order for insurance to cover it has to have Dx Code.  Rx faxed.

## 2019-08-26 ENCOUNTER — Encounter: Payer: Self-pay | Admitting: Internal Medicine

## 2019-08-26 ENCOUNTER — Encounter: Payer: Self-pay | Admitting: *Deleted

## 2019-08-26 ENCOUNTER — Other Ambulatory Visit: Payer: Self-pay | Admitting: *Deleted

## 2019-08-26 NOTE — Patient Outreach (Signed)
[facility]) Care Management [facility] CM Telephone Outreach Post-hospital discharge day # 116 without unplanned hospital re-admission Transfer to [place]  08/26/2019  [name] Sr. 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 Patient also had 2 subsequent ED visits, and was discharged home. Patient was discharged from most recent inpatient hospitalization to self-care without home health services in place. Patient has history including, but not limited to, DM-II with recent A1-C of 9.0; HTN/ HLD; CAD; and GERD.  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 Patient sounds to be in no distress throughout phone call today.  Patient further reports:  -- attended recent ENT appointment per post-hospital discharge referral for ongoing hoarseness post- COVID infection; reports no changes to plan of care; states he believes hoarseness is "slightly better." -- attended recent eye/ vision appointment and got "a great report;" reports no concerns or changes in vision from last years annual visit -- no concerns with medications; continues taking as prescribed; reports that he forgot to take his long-acting insulin last night due to being out later than usual at [employer]; states he has not forgotten to take insulin in a long time, but 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, occupational at his church, and "just forgot;" reports fasting blood sugar this morning of "250" as a result of him  forgetting his long acting HS insulin ---- independently verbalizes ongoing good understanding of use of both long and short-acting insulin -- alternates between using CGM and glucometer; states that there was recently a period of time when he did not have the [employer] probes due to cost going up $170.00 for 2 packs; states during that time he used his glucometer; reports he has now obtained [employer] sensors and plans to begin using soon -- reviewed with patient recent blood sugars at home ---- reports fasting ranges "around 150" consistently; reports an isolated low "last week" of "62;" continues to independently verbalize appropriate action plan for same; reports significantly decreased episodes of hypoglycemia post-hospital discharge, which he initially struggled with while diagnosed with covid ---- reports post-prandial ranges consistently "between 180-200"  -- declines desire for additional printed educational material, states he has had diabetes so long, he "has read it all;" and to his credit, he has a very good general understanding around dietary management of DM, although he adits that he always benefits from reinforcement of same -- plans to attend upcoming scheduled PCP office visit October 21, 2019 for next A1-C; reviewed with patient his last A1-C value of 7.6 (06/14/19), which was decreased from previous reading of 9.2 (03/09/19)  Patient denies further issues, concerns, or problems today, stating that 'things are evening out,' and he denies ongoing care coordination/ care management needs.  Discussed transfer to [place] and patient again verbalizes agreement with same.  Explained to patient that [facility] would contact him next month and he is agreeable/ verbalizes understanding of same.   Iconfirmed that patient hasmy direct phone number, the main [facility] CM office phone number, and  the [facility] CM 24-hour nurse advice phone number should issues arise prior to  [place] month.   Plan:  Patient will take medications as prescribed and will attend all scheduled provider appointments  Patient will promptly notify care providers for any new concerns/ issues/ problems that arise  Patient will continue monitoring/ recordingblood sugarsat leastTIDand will follow established action plan for episodes hypoglycemia  I will place referral for [place] for ongoing reinforcement of self-health management of chronic disease state of DM, and willl make patient's PCP aware of same (will send [facility] discipline closure letter as quarterly summary)  Patient will engage with [place] once outreach is established  [facility] CM Care Plan Problem Two     Most Recent Value  Care Plan Problem Two Self- health management of chronic disease state of DM, as evidenced by patient reporting  Role Documenting the Problem Two Care Management Coordinator  Care Plan for Problem Two Active  Interventions for Problem Two Long Term Goal  Confirmed that patient has continued monitoring and recording blood sugars at home,  reviewed recent blood sugars with patient and discussed his isolated high and low readings,  confirmed that patient is able to independently verbalize accurate action plan for high/ low blood sugars and continues taking long and short acting insulin as prescribed  [facility] Long Term Goal Over the next 60 days, patient will continue to monitor and record daily blood sugars 3-4 times daily using CGM, as evidenced by review of same with patient during [facility] RN CM outreach  [facility] Long Term Goal Start Date 06/21/19  [facility] Long Term Goal Met Date 08/26/19  [Goal Met]  [facility] CM Short Term Goal #1  Over the next 30 days, patient will engage with [facility] RN health Coach for ongoing outreach and reinforcement of self-health management strategies for Diabetes, as evidenced by successful engagement of patient with RN Health Coach once outreach is  established  [facility] CM Short Term Goal #1 Start Date 08/26/19  Interventions for Short Term Goal #2  Discussed role of [place] with patient and encouraged his active engagement with [place] once outreach by [employer] is established     It has been apleasure caring for Fredirick Lathe, RN, BSN, [place] Coordinator [facility] Care Management  [phone]

## 2019-08-27 ENCOUNTER — Other Ambulatory Visit: Payer: Self-pay | Admitting: *Deleted

## 2019-08-30 ENCOUNTER — Other Ambulatory Visit: Payer: Self-pay | Admitting: Internal Medicine

## 2019-08-30 NOTE — Telephone Encounter (Signed)
rx sent to pharmacy by e-script  

## 2019-09-07 DIAGNOSIS — R490 Dysphonia: Secondary | ICD-10-CM | POA: Diagnosis not present

## 2019-09-07 DIAGNOSIS — J382 Nodules of vocal cords: Secondary | ICD-10-CM | POA: Diagnosis not present

## 2019-09-07 DIAGNOSIS — J383 Other diseases of vocal cords: Secondary | ICD-10-CM | POA: Diagnosis not present

## 2019-09-13 DIAGNOSIS — R05 Cough: Secondary | ICD-10-CM | POA: Diagnosis not present

## 2019-09-13 DIAGNOSIS — J382 Nodules of vocal cords: Secondary | ICD-10-CM | POA: Diagnosis not present

## 2019-09-13 DIAGNOSIS — J383 Other diseases of vocal cords: Secondary | ICD-10-CM | POA: Diagnosis not present

## 2019-09-13 DIAGNOSIS — R490 Dysphonia: Secondary | ICD-10-CM | POA: Diagnosis not present

## 2019-09-20 ENCOUNTER — Other Ambulatory Visit: Payer: Self-pay | Admitting: Internal Medicine

## 2019-09-20 DIAGNOSIS — G2581 Restless legs syndrome: Secondary | ICD-10-CM

## 2019-09-20 MED ORDER — PRAMIPEXOLE DIHYDROCHLORIDE 1 MG PO TABS: ORAL_TABLET | ORAL | 1 refills | Status: DC

## 2019-09-20 NOTE — Telephone Encounter (Signed)
Patient called requested refill. Requested to be sent to [facility]

## 2019-09-22 ENCOUNTER — Ambulatory Visit: Payer: Medicare Other | Admitting: Cardiology

## 2019-09-24 ENCOUNTER — Encounter: Payer: Self-pay | Admitting: *Deleted

## 2019-09-24 ENCOUNTER — Other Ambulatory Visit: Payer: Self-pay | Admitting: *Deleted

## 2019-09-24 NOTE — Patient Outreach (Signed)
[facility] [place] [facility]) Care Management  [place]  09/24/2019   Kenneth Whetstine Sr. 04/05/1950 [id number]  Subjective: Successful telephone outreach call to patient. HIPAA identifiers obtained. Patient reports that he is doing good overall. His A1c went from 9.2 to 7.6 on 06/14/19. Patient explained that lately, perhaps because he has returned to work which causes him to travel for a week at a time, his blood sugars have been more elevated. Patient reports that his FBS today was 176 and ranges between 90-180. His postprandial blood sugars can have wide ranges depending on what the patient eats and range from 54->200. Patient discussed needing do better with his diabetic diet. He also explained that he does not take the Novolog when he is on the road due to hypoglycemia and added that this insulin usually drops his blood sugar a couple of hours after he takes it. Nurse suggested that he speak to PCP about other insulin options that would not take as long to drop his blood sugar and patient verbalized understanding. Patient states that he has had a tough year with having been very sick with covid-19 and explains that he feels he doing well emotionally. He states he has good support from his family and currently does not have any further questions or concerns.   Encounter Medications:  Outpatient Encounter Medications as of 09/24/2019  Medication Sig Note  . acetaminophen (TYLENOL) 500 MG tablet Take 500-1,000 mg by mouth every 6 (six) hours as needed (for pain/headaches.).   Marland Kitchen albuterol (PROVENTIL HFA;VENTOLIN HFA) 108 (90 Base) MCG/ACT inhaler Inhale 2 puffs into the lungs every 6 (six) hours as needed for wheezing or shortness of breath. 05/18/2019: Reports using 2-3 times per day  . Alirocumab (PRALUENT) 75 MG/ML SOAJ Inject 75 mg into the skin every 14 (fourteen) days.   Marland Kitchen aspirin 81 MG EC tablet Take 1 tablet (81 mg total) by mouth daily.   . BD PEN NEEDLE NANO 2ND GEN 32G X 4 MM MISC USE  TWICE DAILY WITH LANTUS PEN   . benzonatate (TESSALON) 200 MG capsule TAKE 1 CAPSULE BY MOUTH EVERY DAY 3 TIMES A DAY AS NEEDED FOR COUGH   . carvedilol (COREG) 3.125 MG tablet Take 1 tablet (3.125 mg total) by mouth 2 (two) times daily with a meal.   . clopidogrel (PLAVIX) 75 MG tablet TAKE 1 TABLET BY MOUTH EVERY DAY   . Continuous Blood Gluc Receiver (FREESTYLE LIBRE 14 DAY READER) DEVI Inject 1 Device as directed daily as needed. Use once daily as direct to check blood sugar E11.51   . Continuous Blood Gluc Sensor (FREESTYLE LIBRE 14 DAY SENSOR) MISC Use to test blood sugar three times daily. E11.51. E11.59   . [facility] VITAMIN C 500 MG tablet TAKE ONE TABLET BY MOUTHY TWICE DAILY   . D-5000 125 MCG (5000 UT) TABS TAKE 1 TABLET BY MOUTH EVERY DAY   . dextrose (GLUTOSE) 40 % GEL Take 37.5 g by mouth once as needed for up to 1 dose for low blood sugar. (Patient not taking: Reported on 06/23/2019)   . famotidine (PEPCID) 20 MG tablet TAKE 1 TABLET BY MOUTH EVERY DAY   . fluticasone (FLONASE) 50 MCG/ACT nasal spray Place 1 spray into both nostrils daily as needed for allergies or rhinitis.   Marland Kitchen gabapentin (NEURONTIN) 100 MG capsule Take 100 mg by mouth 2 (two) times daily.   . insulin aspart (NOVOLOG FLEXPEN) 100 UNIT/ML FlexPen Based on carbohydrate intake 07/21/2019: Reports not taking regularly-  states has not needed; takes "occasionally" based on meal time blood sugars  . Insulin Glargine (BASAGLAR KWIKPEN) 100 UNIT/ML Inject 0.3 mLs (30 Units total) into the skin at bedtime. 07/21/2019: Reports taking 30-35 Units QD  . LIFESCAN FINEPOINT LANCETS MISC Use to test for blood sugar three times daily dx: E11.51   . metFORMIN (GLUCOPHAGE) 1000 MG tablet TAKE 1 TABLET BY MOUTH 2 TIMES DAILY WITH A MEAL   . nitroGLYCERIN (NITROSTAT) 0.4 MG SL tablet PLACE 1 TABLET (0.4 MG TOTAL) UNDER THE TONGUE EVERY 5 (FIVE) MINUTES AS NEEDED FOR CHEST PAIN. 05/18/2019: Reports has not needed recently  . ONETOUCH ULTRA test  strip USE TO TEST FOR BLOOD SUGAR THREE TIMES DAILY DX: E11.51   . pantoprazole (PROTONIX) 40 MG tablet Take 40 mg by mouth daily. 05/18/2019: Reports taking BID  . pramipexole (MIRAPEX) 1 MG tablet Take two tablets by mouth at bedtime   . valsartan (DIOVAN) 80 MG tablet Take 1 tablet (80 mg total) by mouth daily.    No facility-administered encounter medications on file as of 09/24/2019.    Functional Status:  In your present state of health, do you have any difficulty performing the following activities: 09/24/2019 05/18/2019  Hearing? N N  Vision? N N  Difficulty concentrating or making decisions? N -  Walking or climbing stairs? N -  Dressing or bathing? N -  Doing errands, shopping? [place] and eating ? N -  Using the Toilet? N -  In the past six months, have you accidently leaked urine? N N  Do you have problems with loss of bowel control? N N  Managing your Medications? N -  Managing your Finances? N -  Housekeeping or managing your Housekeeping? N -  Some recent data might be hidden    Fall/Depression Screening: Fall Risk  09/24/2019 06/17/2019 05/31/2019  Falls in the past year? 0 0 0  Number falls in past yr: 0 0 0  Comment - - -  Injury with Fall? 0 0 0  Comment - - -  Risk for fall due to : - - -  Risk for fall due to: Comment - - -  Follow up Falls prevention discussed;Falls evaluation completed;Education provided - -   PHQ 2/9 Scores 09/24/2019 06/17/2019 05/31/2019 05/17/2019 05/10/2019 05/05/2019 05/03/2019  PHQ - 2 Score 0 0 0 0 0 0 0   Goals Addressed            This Visit's Progress   . Patient will maintian his A1c below 8 within the next 90 days       [place] (see longtitudinal plan of care for additional care plan information)  Objective:  Lab Results  Component Value Date   HGBA1C 7.6 (H) 06/14/2019 .   Lab Results  Component Value Date   CREATININE 0.96 06/14/2019   CREATININE 1.23 05/04/2019   CREATININE 0.93  05/01/2019 .   Marland Kitchen No results found for: EGFR  Current Barriers:  Marland Kitchen Knowledge Deficits related to basic Diabetes pathophysiology and self care/management  Case Manager Clinical Goal(s):  Over the next 90 days, patient will demonstrate improved adherence to prescribed treatment plan for diabetes self care/management as evidenced by:  Marland Kitchen Verbalize adherence to ADA/ carb modified diet within the next 90 days . Patient will start to go back to the gym to increase physical activity within the next 90 days  Interventions:  . Reviewed medications with patient and discussed  importance of medication adherence . Discussed plans with patient for ongoing care management follow up and provided patient with direct contact information for care management team . Encouraged patient to monitor carbohydrates closely, discussed increasing physical activity by going back to the gym and doing weight training, discussed eating protein along with 6 ounces of orange juice to manage hypoglycemia   Patient Self Care Activities:  . Self administers oral medications as prescribed . Self administers insulin as prescribed . Attends all scheduled provider appointments . Checks blood sugars as prescribed and utilize hyper and hypoglycemia protocol as needed  Initial goal documentation       Plan: RN Health Coach will send PCP Barrier Letter and today's assessment note, will send patient weight loss education, will call patient within the month of October, and patient agrees to future outreach calls.   Emelia Loron RN, BSN [place] [phone] [name]

## 2019-09-27 ENCOUNTER — Other Ambulatory Visit: Payer: Self-pay | Admitting: Internal Medicine

## 2019-09-27 DIAGNOSIS — J383 Other diseases of vocal cords: Secondary | ICD-10-CM | POA: Diagnosis not present

## 2019-09-27 DIAGNOSIS — R490 Dysphonia: Secondary | ICD-10-CM | POA: Diagnosis not present

## 2019-09-27 DIAGNOSIS — J382 Nodules of vocal cords: Secondary | ICD-10-CM | POA: Diagnosis not present

## 2019-10-01 DIAGNOSIS — M25511 Pain in right shoulder: Secondary | ICD-10-CM | POA: Diagnosis not present

## 2019-10-11 DIAGNOSIS — R490 Dysphonia: Secondary | ICD-10-CM | POA: Diagnosis not present

## 2019-10-11 DIAGNOSIS — R05 Cough: Secondary | ICD-10-CM | POA: Diagnosis not present

## 2019-10-11 DIAGNOSIS — J382 Nodules of vocal cords: Secondary | ICD-10-CM | POA: Diagnosis not present

## 2019-10-11 DIAGNOSIS — J383 Other diseases of vocal cords: Secondary | ICD-10-CM | POA: Diagnosis not present

## 2019-10-18 ENCOUNTER — Other Ambulatory Visit: Payer: Self-pay

## 2019-10-18 ENCOUNTER — Other Ambulatory Visit: Payer: Medicare Other

## 2019-10-18 DIAGNOSIS — J410 Simple chronic bronchitis: Secondary | ICD-10-CM | POA: Diagnosis not present

## 2019-10-18 DIAGNOSIS — E1151 Type 2 diabetes mellitus with diabetic peripheral angiopathy without gangrene: Secondary | ICD-10-CM

## 2019-10-18 DIAGNOSIS — R490 Dysphonia: Secondary | ICD-10-CM | POA: Diagnosis not present

## 2019-10-18 DIAGNOSIS — E7800 Pure hypercholesterolemia, unspecified: Secondary | ICD-10-CM

## 2019-10-18 DIAGNOSIS — I272 Pulmonary hypertension, unspecified: Secondary | ICD-10-CM

## 2019-10-18 DIAGNOSIS — I10 Essential (primary) hypertension: Secondary | ICD-10-CM

## 2019-10-18 DIAGNOSIS — I25118 Atherosclerotic heart disease of native coronary artery with other forms of angina pectoris: Secondary | ICD-10-CM

## 2019-10-19 LAB — LIPID PANEL
Cholesterol: 155 mg/dL (ref <200)
HDL: 38 mg/dL — ABNORMAL LOW (ref >=40)
LDL Cholesterol (Calc): 87 mg/dL (calc)
Non-HDL Cholesterol (Calc): 117 mg/dL (calc) (ref <130)
Total CHOL/HDL Ratio: 4.1 (calc) (ref <5.0)
Triglycerides: 199 mg/dL — ABNORMAL HIGH (ref <150)

## 2019-10-19 LAB — COMPLETE METABOLIC PANEL WITH GFR
AG Ratio: 2 (calc) (ref 1.0–2.5)
ALT: 30 U/L (ref 9–46)
AST: 20 U/L (ref 10–35)
Albumin: 4.2 g/dL (ref 3.6–5.1)
Alkaline phosphatase (APISO): 46 U/L (ref 35–144)
BUN: 18 mg/dL (ref 7–25)
CO2: 29 mmol/L (ref 20–32)
Calcium: 9.1 mg/dL (ref 8.6–10.3)
Chloride: 102 mmol/L (ref 98–110)
Creat: 1.09 mg/dL (ref 0.70–1.18)
GFR, Est African American: 79 mL/min/{1.73_m2} (ref >=60)
GFR, Est Non African American: 68 mL/min/{1.73_m2} (ref >=60)
Globulin: 2.1 g/dL (calc) (ref 1.9–3.7)
Glucose, Bld: 186 mg/dL — ABNORMAL HIGH (ref 65–99)
Potassium: 4.7 mmol/L (ref 3.5–5.3)
Sodium: 138 mmol/L (ref 135–146)
Total Bilirubin: 0.5 mg/dL (ref 0.2–1.2)
Total Protein: 6.3 g/dL (ref 6.1–8.1)

## 2019-10-19 LAB — CBC WITH DIFFERENTIAL/PLATELET
Absolute Monocytes: 414 cells/uL (ref 200–950)
Basophils Absolute: 9 cells/uL (ref 0–200)
Basophils Relative: 0.2 %
Eosinophils Absolute: 110 cells/uL (ref 15–500)
Eosinophils Relative: 2.5 %
HCT: 41.3 % (ref 38.5–50.0)
Hemoglobin: 14.1 g/dL (ref 13.2–17.1)
Lymphs Abs: 1091 cells/uL (ref 850–3900)
MCH: 29.7 pg (ref 27.0–33.0)
MCHC: 34.1 g/dL (ref 32.0–36.0)
MCV: 87.1 fL (ref 80.0–100.0)
MPV: 11.3 fL (ref 7.5–12.5)
Monocytes Relative: 9.4 %
Neutro Abs: 2776 cells/uL (ref 1500–7800)
Neutrophils Relative %: 63.1 %
Platelets: 118 10*3/uL — ABNORMAL LOW (ref 140–400)
RBC: 4.74 10*6/uL (ref 4.20–5.80)
RDW: 13.1 % (ref 11.0–15.0)
Total Lymphocyte: 24.8 %
WBC: 4.4 10*3/uL (ref 3.8–10.8)

## 2019-10-19 LAB — HEMOGLOBIN A1C
Hgb A1c MFr Bld: 8.8 % of total Hgb — ABNORMAL HIGH (ref <5.7)
Mean Plasma Glucose: 206 (calc)
eAG (mmol/L): 11.4 (calc)

## 2019-10-19 NOTE — Progress Notes (Signed)
Sugar average has trended back up considerably to 8.8.  I had seen a note from [facility] about him falling back into his old eating habits.  We'll need to work on that at his appt.  Triglycerides are back up along with it.   Bad cholesterol is actually better than usual.   Kidneys, electrolytes and liver are all ok.

## 2019-10-20 DIAGNOSIS — M25511 Pain in right shoulder: Secondary | ICD-10-CM | POA: Diagnosis not present

## 2019-10-21 ENCOUNTER — Ambulatory Visit: Payer: Medicare Other | Admitting: Internal Medicine

## 2019-10-23 DIAGNOSIS — M25511 Pain in right shoulder: Secondary | ICD-10-CM | POA: Diagnosis not present

## 2019-10-27 ENCOUNTER — Encounter: Payer: Self-pay | Admitting: Cardiology

## 2019-10-27 DIAGNOSIS — M25511 Pain in right shoulder: Secondary | ICD-10-CM | POA: Diagnosis not present

## 2019-10-28 ENCOUNTER — Encounter: Payer: Self-pay | Admitting: Internal Medicine

## 2019-10-28 ENCOUNTER — Other Ambulatory Visit: Payer: Self-pay

## 2019-10-28 ENCOUNTER — Ambulatory Visit (INDEPENDENT_AMBULATORY_CARE_PROVIDER_SITE_OTHER): Payer: Medicare Other | Admitting: Internal Medicine

## 2019-10-28 VITALS — BP 132/84 | HR 68 | Temp 97.5°F | Ht 68.0 in | Wt 190.1 lb

## 2019-10-28 DIAGNOSIS — R490 Dysphonia: Secondary | ICD-10-CM | POA: Diagnosis not present

## 2019-10-28 DIAGNOSIS — Z794 Long term (current) use of insulin: Secondary | ICD-10-CM

## 2019-10-28 DIAGNOSIS — E1159 Type 2 diabetes mellitus with other circulatory complications: Secondary | ICD-10-CM

## 2019-10-28 DIAGNOSIS — E7800 Pure hypercholesterolemia, unspecified: Secondary | ICD-10-CM | POA: Diagnosis not present

## 2019-10-28 DIAGNOSIS — J410 Simple chronic bronchitis: Secondary | ICD-10-CM

## 2019-10-28 DIAGNOSIS — I10 Essential (primary) hypertension: Secondary | ICD-10-CM | POA: Diagnosis not present

## 2019-10-28 DIAGNOSIS — E1165 Type 2 diabetes mellitus with hyperglycemia: Secondary | ICD-10-CM

## 2019-10-28 DIAGNOSIS — I25118 Atherosclerotic heart disease of native coronary artery with other forms of angina pectoris: Secondary | ICD-10-CM

## 2019-10-28 DIAGNOSIS — S46111A Strain of muscle, fascia and tendon of long head of biceps, right arm, initial encounter: Secondary | ICD-10-CM

## 2019-10-28 DIAGNOSIS — IMO0002 Reserved for concepts with insufficient information to code with codable children: Secondary | ICD-10-CM

## 2019-10-28 DIAGNOSIS — S46011A Strain of muscle(s) and tendon(s) of the rotator cuff of right shoulder, initial encounter: Secondary | ICD-10-CM

## 2019-10-28 NOTE — Patient Instructions (Addendum)
Get back to using basaglar consistently. If your glucose is less than 150, half your basaglar if you're concerned you'll drop. Try to get your 1 mile walk in each day.   Use your novolog to correct at your meals. Try to eat at consistent times and take your novolog and basaglar at consistent times. Please go get your covid vaccines series ASAP.
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
## 2019-10-29 ENCOUNTER — Ambulatory Visit: Payer: Medicare Other | Admitting: Cardiology

## 2019-11-10 NOTE — H&P (Signed)
Patient ID: Kenneth Sicalph Zuccaro Sr. MRN: [id number] DOB/AGE: 1950/10/23 69 y.o.   Reason for visit: Right shoulder pain.     Planned Procedure Date: 12/07/2019  HPI: History of present illness: We did an MRI which demonstrates severe AC joint arthropathy, biceps tendinopathy and a SLAP tear.  Tendinosis in his cuff, but no obvious tearing.  This has been bothersome for him.  He cannot sleep at night.  He is having difficulty raising his arm or working overhead.    Past Medical History:  Diagnosis Date  . Arthritis    "left thumb" (05/23/2017)  . Barrett's esophagus    "we've been told that it's all gone; still take RX for GERD" (05/23/2017)  . CAD (coronary artery disease), native coronary artery    Coronary angiogram 05/03/2014:  Proximal LAD 3.0x12 mm Promus premier stent.  04/08/2014: Mid Cx 3.5 x 16 mm Promus DES.  03/29/2013: Mid LAD 2.75 x 38 mm Promus Premier drug-eluting stent, balloon angioplasty of D1 and distal LAD and stenting of distal RCA with 2.75 x 24 mm promos Premier drug-eluting stent 12/15/2012 widely patent.  . Cervicalgia   . Chronic bronchitis (HCC)    "q yr" (05/23/2017)  . Complication of anesthesia    " I do not wake up very well "  . COVID-19   . Family history of adverse reaction to anesthesia    "son w/PONV"  . GERD (gastroesophageal reflux disease)   . Heart murmur    "grew out of it"   . Hyperlipidemia   . Hypoglycemia, unspecified   . IDDM (insulin dependent diabetes mellitus)   . Impotence of organic origin   . Iron deficiency anemia   . Other malaise and fatigue   . PONV (postoperative nausea and vomiting)   . Restless leg   . Tension headache    "sometimes" (05/23/2017)  . Unspecified gastritis and gastroduodenitis with hemorrhage   . Unstable angina pectoris (HCC) 04/08/2014   Coronary angiogram 05/03/2014:  Proximal LAD 3.0x12 mm Promus premier stent.  04/08/2014: Mid Cx 3.5 x 16 mm Promus DES.  03/29/2013: Mid LAD 2.75 x 38 mm Promus Premier drug-eluting  stent, balloon angioplasty of D1 and distal LAD and stenting of distal RCA with 2.75 x 24 mm promos Premier drug-eluting stent 12/15/2012 widely patent.   Past Surgical History:  Procedure Laterality Date  . CARDIAC CATHETERIZATION N/A 09/25/2015   Procedure: Left Heart Cath and Coronary Angiography;  Surgeon: Yates DecampJay Ganji, MD;  [facility];  Service: Cardiovascular;  Laterality: N/A;  . CARDIAC CATHETERIZATION N/A 09/25/2015   Procedure: Intravascular Pressure Wire/FFR Study;  Surgeon: Yates DecampJay Ganji, MD;  [facility];  Service: Cardiovascular;  Laterality: N/A;  . CORONARY ANGIOPLASTY WITH STENT PLACEMENT  12/15/2012; 04/28/2014; 05/03/2014   "2; 1; 1"   . FRACTIONAL FLOW RESERVE WIRE Right 03/26/2013   Procedure: FRACTIONAL FLOW RESERVE WIRE;  Surgeon: Pamella PertJagadeesh R Ganji, MD;  [facility];  Service: Cardiovascular;  Laterality: Right;  . FRACTIONAL FLOW RESERVE WIRE N/A 05/03/2014   Procedure: FRACTIONAL FLOW RESERVE WIRE;  Surgeon: Pamella PertJagadeesh R Ganji, MD;  [facility];  Service: Cardiovascular;  Laterality: N/A;  . HERNIA REPAIR  2010   "umbilical"   . INCISION AND DRAINAGE ABSCESS Left 05/2007   "groin"   . KNEE SURGERY Right 1992;  2000   "calcium deposits removed" (12/15/2012)  . LEFT HEART CATH AND CORONARY ANGIOGRAPHY N/A 05/23/2017   Procedure: LEFT HEART CATH AND CORONARY ANGIOGRAPHY;  Surgeon: [name], MD;  Location: [facility] INVASIVE CV LAB;  Service: Cardiovascular;  Laterality: N/A;  . LEFT HEART CATHETERIZATION WITH CORONARY ANGIOGRAM N/A 12/15/2012   Procedure: LEFT HEART CATHETERIZATION WITH CORONARY ANGIOGRAM;  Surgeon: Pamella Pert, MD;  Location: [facility] CATH LAB;  Service: Cardiovascular;  Laterality: N/A;  . LEFT HEART CATHETERIZATION WITH CORONARY ANGIOGRAM N/A 03/26/2013   Procedure: LEFT HEART CATHETERIZATION WITH CORONARY ANGIOGRAM;  Surgeon: Pamella Pert, MD;  Location: [facility] CATH LAB;  Service: Cardiovascular;  Laterality: N/A;  .  LEFT HEART CATHETERIZATION WITH CORONARY ANGIOGRAM N/A 04/08/2014   Procedure: LEFT HEART CATHETERIZATION WITH CORONARY ANGIOGRAM;  Surgeon: Micheline Chapman, MD;  Location: [facility] CATH LAB;  Service: Cardiovascular;  Laterality: N/A;  . PERCUTANEOUS CORONARY STENT INTERVENTION (PCI-S) N/A 05/03/2014   Procedure: PERCUTANEOUS CORONARY STENT INTERVENTION (PCI-S);  Surgeon: Pamella Pert, MD;  Location: [facility] CATH LAB;  Service: Cardiovascular;  Laterality: N/A;  . UMBILICAL HERNIA REPAIR     Allergies  Allergen Reactions  . Lyrica [Pregabalin] Other (See Comments)    Made patient very lethargic the next morning, very hard to patient to function, move, etc.   . Statins Other (See Comments)    Causes Restless Legs, rhabdomyolysis  . Metformin And Related     GI upset  . Trulicity [Dulaglutide] Other (See Comments)    GI side effects    Prior to Admission medications   Medication Sig Start Date End Date Taking? Authorizing Provider  acetaminophen (TYLENOL) 500 MG tablet Take 500-1,000 mg by mouth every 6 (six) hours as needed (for pain/headaches.).    [provider]  albuterol (PROVENTIL HFA;VENTOLIN HFA) 108 (90 Base) MCG/ACT inhaler Inhale 2 puffs into the lungs every 6 (six) hours as needed for wheezing or shortness of breath. 06/08/18   Reed, Tiffany L, DO  Alirocumab (PRALUENT) 75 MG/ML SOAJ Inject 75 mg into the skin every 14 (fourteen) days. 04/27/19   [name], MD  aspirin 81 MG EC tablet Take 1 tablet (81 mg total) by mouth daily. 12/16/12   [name], MD  BD PEN NEEDLE NANO 2ND GEN 32G X 4 MM MISC USE TWICE DAILY WITH LANTUS PEN 08/30/19   Reed, Tiffany L, DO  benzonatate (TESSALON) 200 MG capsule TAKE 1 CAPSULE BY MOUTH EVERY DAY 3 TIMES A DAY AS NEEDED FOR COUGH 05/24/19   Reed, Tiffany L, DO  carvedilol (COREG) 3.125 MG tablet Take 1 tablet (3.125 mg total) by mouth 2 (two) times daily with a meal. 04/18/19   Elgergawy, Leana Roe, MD  clopidogrel (PLAVIX) 75 MG tablet TAKE 1 TABLET  BY MOUTH EVERY DAY 08/05/19   [name], MD  Continuous Blood Gluc Receiver (FREESTYLE LIBRE 14 DAY READER) DEVI Inject 1 Device as directed daily as needed. Use once daily as direct to check blood sugar E11.51 05/03/19   Reed, Tiffany L, DO  Continuous Blood Gluc Sensor (FREESTYLE LIBRE 14 DAY SENSOR) MISC Use to test blood sugar three times daily. E11.51. E11.59 08/19/19   Reed, Tiffany L, DO  [employer] VITAMIN C 500 MG tablet TAKE ONE TABLET BY MOUTHY TWICE DAILY 08/09/19   Reed, Tiffany L, DO  D-5000 125 MCG (5000 UT) TABS TAKE 1 TABLET BY MOUTH EVERY DAY 08/06/19   Reed, Tiffany L, DO  famotidine (PEPCID) 20 MG tablet TAKE 1 TABLET BY MOUTH EVERY DAY 07/06/19   Reed, Tiffany L, DO  fluticasone (FLONASE) 50 MCG/ACT nasal spray Place 1 spray into both nostrils daily as needed for  allergies or rhinitis.    [provider]  gabapentin (NEURONTIN) 100 MG capsule Take 100 mg by mouth 2 (two) times daily.    [provider]  insulin aspart (NOVOLOG FLEXPEN) 100 UNIT/ML FlexPen Based on carbohydrate intake 05/03/19   Reed, Tiffany L, DO  Insulin Glargine (BASAGLAR KWIKPEN) 100 UNIT/ML Inject 0.3 mLs (30 Units total) into the skin at bedtime. 05/31/19   Reed, Tiffany L, DO  LIFESCAN FINEPOINT LANCETS MISC Use to test for blood sugar three times daily dx: E11.51 06/01/18   [name], NP  metFORMIN (GLUCOPHAGE) 1000 MG tablet TAKE 1 TABLET BY MOUTH 2 TIMES DAILY WITH A MEAL 05/11/19   Reed, Tiffany L, DO  nitroGLYCERIN (NITROSTAT) 0.4 MG SL tablet PLACE 1 TABLET (0.4 MG TOTAL) UNDER THE TONGUE EVERY 5 (FIVE) MINUTES AS NEEDED FOR CHEST PAIN. 06/23/17   Reed, Tiffany L, DO  ONETOUCH ULTRA test strip USE TO TEST FOR BLOOD SUGAR THREE TIMES DAILY DX: E11.51 12/04/18   Reed, Tiffany L, DO  pantoprazole (PROTONIX) 40 MG tablet TAKE ONE TABLET BY MOUTH TWICE DAILY FOR STOMACH 09/27/19   Reed, Tiffany L, DO  pramipexole (MIRAPEX) 1 MG tablet Take two tablets by mouth at bedtime 09/20/19   Reed, Tiffany L, DO   valsartan (DIOVAN) 80 MG tablet Take 1 tablet (80 mg total) by mouth daily. 03/17/19   [name], MD   Social History   Socioeconomic History  . Marital status: Married    Spouse name: Pam  . Number of children: 2  . Years of education: Not on file  . Highest education level: Not on file  Occupational History  . Occupation: [employer] doctor  Tobacco Use  . Smoking status: Never Smoker  . Smokeless tobacco: Never Used  Vaping Use  . Vaping Use: Never used  Substance and Sexual Activity  . Alcohol use: No  . Drug use: No  . Sexual activity: Not Currently  Other Topics Concern  . Not on file  Social History Narrative  . Not on file   Social Determinants of Health   Financial Resource Strain: Low Risk   . Difficulty of Paying Living Expenses: Not very hard  Food Insecurity: No Food Insecurity  . Worried About Programme researcher, broadcasting/film/video in the Last Year: Never true  . Ran Out of Food in the Last Year: Never true  Transportation Needs: No Transportation Needs  . Lack of Transportation (Medical): No  . Lack of Transportation (Non-Medical): No  Physical Activity:   . Days of Exercise per Week: Not on file  . Minutes of Exercise per Session: Not on file  Stress:   . Feeling of Stress : Not on file  Social Connections:   . Frequency of Communication with Friends and Family: Not on file  . Frequency of Social Gatherings with Friends and Family: Not on file  . Attends Religious Services: Not on file  . Active Member of Clubs or Organizations: Not on file  . Attends Banker Meetings: Not on file  . Marital Status: Not on file   Family History  Adopted: Yes  Problem Relation Age of Onset  . Emphysema Mother     ROS: Currently denies lightheadedness, dizziness, Fever, chills, CP, SOB.   No personal history of DVT, PE, MI, or CVA. No loose teeth or dentures All other systems have been reviewed and were otherwise currently negative with the exception of those mentioned in  the HPI and as above.  Objective:  Physical Exam: General: Alert, NAD.   HEENT: EOMI, Good Neck Extension  Pulm: No increased work of breathing.  Clear B/L A/P w/o crackle or wheeze.  CV: RRR, No m/g/r appreciated  GI: soft, NT, ND Neuro: Neuro without gross focal deficit.  Sensation intact distally Skin: No lesions in the area of chief complaint MSK/Surgical Site: On exam he has significant tenderness over his biceps tendon.  Positive O'Brien's.  Neurovascularly intact.  Good rotator cuff strength.    Imaging Review MRI 10/23/19 Demonstrates severe AC joint arthropathy, biceps tendinopathy and a SLAP tear.  Tendinosis in his cuff, but no obvious tearing  Assessment: RIGHT SHOULDER BICEPS TENDINOPATHY, SLAP TEAR  Plan: Plan for Procedure(s): SHOULDER ARTHROSCOPY WITH SUBACROMIAL DECOMPRESSION AND DISTAL CLAVICLE EXCISION BICEPS TENODESIS   The risks of nonoperative treatment, versus surgical intervention including but not limited to continued pain, aseptic loosening, stiffness, dislocation/subluxation, infection, bleeding, nerve injury, blood clots, cardiopulmonary complications, morbidity, mortality, among others were discussed. The patient verbalizes understanding and wishes to proceed with the plan.  Patient is being admitted for surgery, OT, pain control, prophylactic antibiotics, VTE prophylaxis, progressive ambulation, ADL's and discharge planning.       Rainey Pines, PA-C 11/10/2019 2:09 PM

## 2019-11-18 ENCOUNTER — Telehealth: Payer: Self-pay

## 2019-11-18 ENCOUNTER — Other Ambulatory Visit: Payer: Self-pay

## 2019-11-22 ENCOUNTER — Other Ambulatory Visit: Payer: Self-pay

## 2019-11-22 ENCOUNTER — Telehealth: Payer: Self-pay

## 2019-11-22 MED ORDER — FUROSEMIDE 20 MG PO TABS
20.0000 mg | ORAL_TABLET | ORAL | 0 refills | Status: DC | PRN
Start: 2019-11-22 — End: 2019-12-17

## 2019-11-22 NOTE — Telephone Encounter (Signed)
Patient called and stated for the past 2-3 weeks, his feet and legs have began to swell. Patient reports no other symptoms. Please advise. Thanks!

## 2019-11-22 NOTE — Telephone Encounter (Signed)
Rx Furosemide 20 mg daily prn for leg swelling, 30 tablets with no refills. Use for 2-3 days only and keep on hand

## 2019-11-22 NOTE — Telephone Encounter (Signed)
Sent in RX and spoke w patient. Patient voiced understanding.

## 2019-12-01 ENCOUNTER — Other Ambulatory Visit: Payer: Self-pay

## 2019-12-01 ENCOUNTER — Ambulatory Visit: Payer: Medicare Other | Admitting: Cardiology

## 2019-12-01 ENCOUNTER — Encounter (HOSPITAL_BASED_OUTPATIENT_CLINIC_OR_DEPARTMENT_OTHER): Payer: Self-pay | Admitting: Orthopedic Surgery

## 2019-12-01 ENCOUNTER — Encounter: Payer: Self-pay | Admitting: Cardiology

## 2019-12-01 VITALS — BP 113/89 | HR 94 | Resp 16 | Ht 68.0 in | Wt 191.0 lb

## 2019-12-01 DIAGNOSIS — I10 Essential (primary) hypertension: Secondary | ICD-10-CM | POA: Diagnosis not present

## 2019-12-01 DIAGNOSIS — R531 Weakness: Secondary | ICD-10-CM | POA: Diagnosis not present

## 2019-12-01 DIAGNOSIS — I25118 Atherosclerotic heart disease of native coronary artery with other forms of angina pectoris: Secondary | ICD-10-CM

## 2019-12-01 DIAGNOSIS — E7800 Pure hypercholesterolemia, unspecified: Secondary | ICD-10-CM | POA: Diagnosis not present

## 2019-12-01 DIAGNOSIS — I959 Hypotension, unspecified: Secondary | ICD-10-CM | POA: Diagnosis not present

## 2019-12-01 DIAGNOSIS — R0602 Shortness of breath: Secondary | ICD-10-CM | POA: Diagnosis not present

## 2019-12-01 MED ORDER — PRALUENT 150 MG/ML ~~LOC~~ SOAJ: 1.0000 "pen " | SUBCUTANEOUS | 3 refills | Status: DC

## 2019-12-01 MED ORDER — VALSARTAN 80 MG PO TABS: 40.0000 mg | ORAL_TABLET | Freq: Every day | ORAL | 3 refills | Status: DC

## 2019-12-01 NOTE — Progress Notes (Addendum)
Spoke w/ via phone for pre-op interview---pt Lab needs dos----none               Lab results------has lab appt 10-1-900 am for cbc, cmet pt/int and get g 2 drink COVID test ------12-03-2019 1000 Arrive at -------900 am 12-07-2019 NPO after MN NO Solid Food.  Clear liquids from MN until---800 am then npo Medications to take morning of surgery -----albuterol inhaler/bring inhaler, carvedilol, flonase, pantaprazole, famotidine Diabetic medication -----take 1/2 dose of glargine hs insulin ( take 20 units) night before surgery, no diabetic meds am of surgery Patient Special Instructions -----remove free style libre day of surgery Pre-Op special Istructions -----none Patient verbalized understanding of instructions that were given at this phone interview. Patient denies shortness of breath, chest pain, fever, cough at this phone interview.  Anesthesia Review:no  PCP:dr tiffany reed lov 10-28-2019 epic Cardiologist :dr Lady Deutscher 12-01-2019 epic Chest x-ray :05-04-2019 epic EKG :12-01-2019 epic Echo :06-08-2019 epic Stress test:05-31-2019 epic Cardiac Cath : last done 05-23-2017 epic Activity level: does yard work and can climb steps without difficulty Sleep Study/ CPAP :none Fasting Blood Sugar : 150      / Checks Blood Sugar 6-7-- times a day:   Blood Thinner/ Instructions /Last Dose: instructed dr dr [name] to stop plavix on 12-02-2019  ASA / Instructions/ Last Dose : instructed by dr [name] to stop aspirin last dose 11-30-2019

## 2019-12-01 NOTE — Progress Notes (Signed)
Primary Physician/Referring:  Kermit Balo, DO  Patient ID: Kenneth Sic Sr., male    DOB: Oct 20, 1950, 69 y.o.   MRN: [id number]  Chief Complaint  Patient presents with  . Coronary Artery Disease  . Hyperlipidemia  . Follow-up    3 month    HPI:    Kenneth Holaway Sr.  is a 69 y.o. caucasian male with coronary artery disease, last cardiac catheterization on 05/29/2017 when he presented with unstable anginal-like symptoms revealing widely patent stent to his LAD, RCA and circumflex and also balloon angioplasty site to D1 and distal LAD.  Fortunately his last PCI has been in 2016.  Past medical history significant for hypertension, hyperlipidemia, GERD, Barrett's esophagus, statin intolerance, uncontrolled diabetes mellitus and also severe restless leg syndrome.  He was admitted to the hospital after Covid 19 infection on 04/09/2019 and was again readmitted on 05/01/2019 and received remdesivir and also Decadron.  States that he has recuperated well now and back to his baseline.  He activated EMS last night as he was not feeling well and felt extremely dizzy and weak and found to be hypotensive and also dropped his blood sugar at the same time.  EKG was normal and he was resuscitated with IV fluids and felt well.  Today he is asymptomatic.  Past Medical History:  Diagnosis Date  . Arthritis    "left thumb" (05/23/2017) right shoulder  . Barrett's esophagus    "we've been told that it's all gone; still take RX for GERD" (05/23/2017)  . Bilateral swelling of feet and ankles x 3 weeks as of 12-01-2019  . CAD (coronary artery disease), native coronary artery    Coronary angiogram 05/03/2014:  Proximal LAD 3.0x12 mm Promus premier stent.  04/08/2014: Mid Cx 3.5 x 16 mm Promus DES.  03/29/2013: Mid LAD 2.75 x 38 mm Promus Premier drug-eluting stent, balloon angioplasty of D1 and distal LAD and stenting of distal RCA with 2.75 x 24 mm promos Premier drug-eluting stent 12/15/2012 widely patent.  . Cervicalgia    . Chronic bronchitis (HCC)    "q yr" (05/23/2017)  . Complication of anesthesia    " I do not wake up very well "  . COVID-19 05/2019   all covid symptoms in hospital x 5 days, all symptoms resolved took monoclonal antibody tx   . Family history of adverse reaction to anesthesia    "son w/PONV"  . GERD (gastroesophageal reflux disease)   . Heart murmur    "grew out of it"   . Hyperlipidemia   . Hypoglycemia, unspecified   . 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 Impotence of organic origin   . Iron deficiency anemia   . Other malaise and fatigue   . Pneumonia 05/2019  . PONV (postoperative nausea and vomiting)    after wisdom teeth pulled  . Restless leg   . Tension headache    "sometimes" (05/23/2017)  . Unspecified gastritis and gastroduodenitis with hemorrhage   . Unstable angina pectoris (HCC) 04/08/2014   Coronary angiogram 05/03/2014:  Proximal LAD 3.0x12 mm Promus premier stent.  04/08/2014: Mid Cx 3.5 x 16 mm Promus DES.  03/29/2013: Mid LAD 2.75 x 38 mm Promus Premier drug-eluting stent, balloon angioplasty of D1 and distal LAD and stenting of distal RCA with 2.75 x 24 mm promos Premier drug-eluting stent 12/15/2012 widely patent.   Past Surgical History:  Procedure Laterality Date  . CARDIAC CATHETERIZATION N/A 09/25/2015   Procedure: Left Heart  Cath and Coronary Angiography;  Surgeon: Yates DecampJay Ladon Vandenberghe, MD;  [facility];  Service: Cardiovascular;  Laterality: N/A;  . CARDIAC CATHETERIZATION N/A 09/25/2015   Procedure: Intravascular Pressure Wire/FFR Study;  Surgeon: Yates DecampJay Aminah Zabawa, MD;  [facility];  Service: Cardiovascular;  Laterality: N/A;  . CORONARY ANGIOPLASTY WITH STENT PLACEMENT  12/15/2012; 04/28/2014; 05/03/2014   "2; 1; 1" , 4 stents 2 balloons  . FRACTIONAL FLOW RESERVE WIRE Right 03/26/2013   Procedure: FRACTIONAL FLOW RESERVE WIRE;  Surgeon: Pamella PertJagadeesh R Luvern Mischke, MD;  [facility];  Service: Cardiovascular;  Laterality:  Right;  . FRACTIONAL FLOW RESERVE WIRE N/A 05/03/2014   Procedure: FRACTIONAL FLOW RESERVE WIRE;  Surgeon: Pamella PertJagadeesh R Haddie Bruhl, MD;  [facility];  Service: Cardiovascular;  Laterality: N/A;  . HERNIA REPAIR  2010   "umbilical"   . INCISION AND DRAINAGE ABSCESS Left 05/2007   "groin"   . KNEE SURGERY Right 1992;  2000   "calcium deposits removed" (12/15/2012)  . LEFT HEART CATH AND CORONARY ANGIOGRAPHY N/A 05/23/2017   Procedure: LEFT HEART CATH AND CORONARY ANGIOGRAPHY;  Surgeon: Yates DecampGanji, Keiasha Diep, MD;  Location: [facility] IN[facility] CV LAB;  Service: Cardiovascular;  Laterality: N/A;  . LEFT HEART CATHETERIZATION WITH CORONARY ANGIOGRAM N/A 12/15/2012   Procedure: LEFT HEART CATHETERIZATION WITH CORONARY ANGIOGRAM;  Surgeon: Pamella PertJagadeesh R Cornelis Kluver, MD;  [facility];  Service: Cardiovascular;  Laterality: N/A;  . LEFT HEART CATHETERIZATION WITH CORONARY ANGIOGRAM N/A 03/26/2013   Procedure: LEFT HEART CATHETERIZATION WITH CORONARY ANGIOGRAM;  Surgeon: Pamella PertJagadeesh R Najee Manninen, MD;  [facility];  Service: Cardiovascular;  Laterality: N/A;  . LEFT HEART CATHETERIZATION WITH CORONARY ANGIOGRAM N/A 04/08/2014   Procedure: LEFT HEART CATHETERIZATION WITH CORONARY ANGIOGRAM;  Surgeon: Micheline ChapmanMichael D Cooper, MD;  [facility];  Service: Cardiovascular;  Laterality: N/A;  . PERCUTANEOUS CORONARY STENT INTERVENTION (PCI-S) N/A 05/03/2014   Procedure: PERCUTANEOUS CORONARY STENT INTERVENTION (PCI-S);  Surgeon: Pamella PertJagadeesh R Khali Perella, MD;  [facility];  Service: Cardiovascular;  Laterality: N/A;  . UMBILICAL HERNIA REPAIR  yrs ago   Social History   Tobacco Use  . Smoking status: Never Smoker  . Smokeless tobacco: Never Used  Substance Use Topics  . Alcohol use: No     ROS  Review of Systems  Constitutional: Negative for malaise/fatigue.  Cardiovascular: Positive for leg swelling. Negative for dyspnea on exertion.  Musculoskeletal: Positive for muscle cramps (restless leg).   Objective    Vitals with BMI 12/01/2019 12/01/2019 10/28/2019  Height 5\' 8"  5\' 8"  5\' 8"   Weight 191 lbs 190 lbs 190 lbs 2 oz  BMI 29.05 28.9 28.91  Systolic 113 - 132  Diastolic 89 - 84  Pulse 94 - 68    Blood pressure 113/89, pulse 94, resp. rate 16, height 5\' 8"  (1.727 m), weight 191 lb (86.6 kg), SpO2 94 %. Body mass index is 29.04 kg/m.   Physical Exam Constitutional:      Appearance: He is well-developed.  Cardiovascular:     Rate and Rhythm: Normal rate and regular rhythm.     Pulses:          Carotid pulses are 2+ on the right side and 2+ on the left side.      Femoral pulses are 2+ on the right side and 2+ on the left side.      Dorsalis pedis pulses are 1+ on the right side and 1+ on the left side.  Posterior tibial pulses are 1+ on the right side and 1+ on the left side.     Heart sounds: Normal heart sounds. No murmur heard.  No gallop.      Comments: 2+ ankle edema.  There is no JVD. Pulmonary:     Effort: Pulmonary effort is normal.     Breath sounds: Normal breath sounds.  Abdominal:     General: Bowel sounds are normal.     Palpations: Abdomen is soft.  Musculoskeletal:        General: Normal range of motion.    Radiology: No results found.  Laboratory examination:   Recent Labs    05/01/19 0344 05/01/19 0344 05/04/19 1022 06/14/19 0807 10/18/19 0811  NA 136   < > 135 139 138  K 4.9   < > 4.2 4.4 4.7  CL 100   < > 99 102 102  CO2 25   < > GLUCOSE 201*   < > 152* 135* 186*  BUN 26*   < > 30* 13 18  CREATININE 0.93   < > 1.23 0.96 1.09  CALCIUM 8.4*   < > 8.3* 9.1 9.1  GFRNONAA >60  --  60*  --  68  GFRAA >60  --  >60  --  79   < > = values in this interval not displayed.   CMP Latest Ref Rng & Units 10/18/2019 06/17/2019 06/14/2019  Glucose 65 - 99 mg/dL 657(Q) - 469(G)  BUN 7 - 25 mg/dL 18 - 13  Creatinine 2.95 - 1.18 mg/dL 2.84 - 1.32  Sodium 440 - 146 mmol/L 138 - 139  Potassium 3.5 - 5.3 mmol/L 4.7 - 4.4  Chloride 98 - 110 mmol/L 102  - 102  CO2 20 - 32 mmol/L 29 - 29  Calcium 8.6 - 10.3 mg/dL 9.1 - 9.1  Total Protein 6.1 - 8.1 g/dL 6.3 6.3 -  Total Bilirubin 0.2 - 1.2 mg/dL 0.5 0.5 -  Alkaline Phos 38 - 126 U/L - - -  AST 10 - 35 U/L 20 17 -  ALT 9 - 46 U/L 30 15 -   CBC Latest Ref Rng & Units 10/18/2019 06/14/2019 05/04/2019  WBC 3.8 - 10.8 Thousand/uL 4.4 4.5 9.0  Hemoglobin 13.2 - 17.1 g/dL 10.2 13.1(L) 13.3  Hematocrit 38 - 50 % 41.3 39.3 40.7  Platelets 140 - 400 Thousand/uL 118(L) 129(L) 134(L)   Lipid Panel Recent Labs    03/09/19 0832 03/09/19 0832 04/14/19 0540 06/14/19 0807 06/17/19 0816 10/18/19 0811  CHOL 166  --   --  162  --  155  TRIG 159*   < > 41 142  --  199*  LDLCALC 103*  --   --  106*  --  87  HDL 37*  --   --  31*  --  38*  CHOLHDL 4.5  --   --  5.2*  --  4.1  LDLDIRECT  --   --   --   --  110*  --    < > = values in this interval not displayed.    HEMOGLOBIN A1C Lab Results  Component Value Date   HGBA1C 8.8 (H) 10/18/2019   MPG 206 10/18/2019   TSH No results for input(s): TSH in the last 8760 hours. Medications and allergies   Allergies  Allergen Reactions  . Lyrica [Pregabalin] Other (See Comments)    Made patient very lethargic the next morning, very hard to patient to  function, move, etc.   . Statins Other (See Comments)    Causes Restless Legs, rhabdomyolysis  . Metformin And Related     GI upset  . Trulicity [Dulaglutide] Other (See Comments)    GI side effects     Current Outpatient Medications on File Prior to Visit  Medication Sig Dispense Refill  . acetaminophen (TYLENOL) 500 MG tablet Take 500-1,000 mg by mouth every 6 (six) hours as needed (for pain/headaches.).    Marland Kitchen albuterol (PROVENTIL HFA;VENTOLIN HFA) 108 (90 Base) MCG/ACT inhaler Inhale 2 puffs into the lungs every 6 (six) hours as needed for wheezing or shortness of breath. 18 g 1  . aspirin 81 MG EC tablet Take 1 tablet (81 mg total) by mouth daily. 60 tablet 0  . BD PEN NEEDLE NANO 2ND GEN 32G X 4  MM MISC USE TWICE DAILY WITH LANTUS PEN 1 each 3  . carvedilol (COREG) 3.125 MG tablet Take 1 tablet (3.125 mg total) by mouth 2 (two) times daily with a meal. 60 tablet 0  . Continuous Blood Gluc Receiver (FREESTYLE LIBRE 14 DAY READER) DEVI Inject 1 Device as directed daily as needed. Use once daily as direct to check blood sugar E11.51 1 each 0  . Continuous Blood Gluc Sensor (FREESTYLE LIBRE 14 DAY SENSOR) MISC Use to test blood sugar three times daily. E11.51. E11.59 2 each 12  . D-5000 125 MCG (5000 UT) TABS TAKE 1 TABLET BY MOUTH EVERY DAY 90 tablet 1  . famotidine (PEPCID) 20 MG tablet TAKE 1 TABLET BY MOUTH EVERY DAY 30 tablet 1  . fluticasone (FLONASE) 50 MCG/ACT nasal spray Place 1 spray into both nostrils daily as needed for allergies or rhinitis.    . furosemide (LASIX) 20 MG tablet Take 1 tablet (20 mg total) by mouth as needed. For swelling. 30 tablet 0  . gabapentin (NEURONTIN) 100 MG capsule Take 100 mg by mouth at bedtime. 200 mg qhs    . insulin aspart (NOVOLOG FLEXPEN) 100 UNIT/ML FlexPen Based on carbohydrate intake 15 mL 11  . Insulin Glargine (BASAGLAR KWIKPEN) 100 UNIT/ML Inject 0.3 mLs (30 Units total) into the skin at bedtime. (Patient taking differently: Inject 40 Units into the skin at bedtime. ) 1 pen 4  . LIFESCAN FINEPOINT LANCETS MISC Use to test for blood sugar three times daily dx: E11.51 300 each 1  . metFORMIN (GLUCOPHAGE) 1000 MG tablet TAKE 1 TABLET BY MOUTH 2 TIMES DAILY WITH A MEAL 180 tablet 0  . nitroGLYCERIN (NITROSTAT) 0.4 MG SL tablet PLACE 1 TABLET (0.4 MG TOTAL) UNDER THE TONGUE EVERY 5 (FIVE) MINUTES AS NEEDED FOR CHEST PAIN. 50 tablet 0  . ONETOUCH ULTRA test strip USE TO TEST FOR BLOOD SUGAR THREE TIMES DAILY DX: E11.51 300 strip 11  . pantoprazole (PROTONIX) 40 MG tablet TAKE ONE TABLET BY MOUTH TWICE DAILY FOR STOMACH 180 tablet 3  . pramipexole (MIRAPEX) 1 MG tablet Take two tablets by mouth at bedtime (Patient taking differently: Take two to three  tablets by mouth at bedtime) 180 tablet 1   No current facility-administered medications on file prior to visit.     Cardiac Studies:   Coronary angiogram 09/25/2015: Ostial Cx 50-60%, FFR 0.86. LVEF 60%. Patent stent placed 05/03/2014: Proximal LAD 3.0x12 mm Promus DES. 04/08/2014: Cx/OM1 with 3.5 x 16 mm Promus DES. H/O Mid LAD 2.75 x 38 mm Promus Premier DES, balloon angioplasty of D1 and distal LAD(03/29/2013) and Distal RCA stent (12/15/2012) with 2.75 x 24 mm  promos Premier DES.  Lower extremity venous duplex for insufficiency 05/29/2017: Bilateral lower extremities negative for DVT or reflux.  Coronary angiogram 05/30/2019 and 05/23/2017: All stents placed previously are patent.  Proximal to mid LAD long stent (Overlaps prior stent) that was placed on 04/08/2014, circumflex/obtuse marginal-1 stenting. History of mid LAD stenting and balloon angioplasty to D1 and distal LAD performed on 03/29/2013, distal RCA stent in 2014. No change in ostial circumflex lesion, the mid LAD stent is widely patent however at the outflow, there is a 30-40% stenosis.  Lexiscan Tetrofosmin stress test 05/31/2019: No previous exam available for comparison. Lexiscan nuclear stress test performed using 1-day protocol. Stress EKG is non-diagnostic, as this is pharmacological stress test. In addition, stress EKG at 85% MPHR shows sinus tachycardia, no ischemic changes.  Myocardial perfusion imaging is normal. Left ventricular ejection fraction is  71% with normal wall motion.  Low risk study.   Echocardiogram 06/08/2019: Left ventricle cavity is normal in size. Mild concentric hypertrophy of  the left ventricle. Normal global wall motion. Normal LV systolic function  with EF 57%. Doppler evidence of grade I (impaired) diastolic dysfunction,  normal LAP.  Trileaflet aortic valve. Moderate (Grade II) aortic regurgitation.  Mild to moderate tricuspid regurgitation.  Mild pulmonary hypertension. Estimated pulmonary artery  systolic pressure  is 39 mmHg.  Compared to previous study in 2017, aortic regurgitation severity  increased from mild to moderate. Mild PH is new.   EKG:  EEKG 12/01/2019: Normal sinus rhythm at rate of 84 bpm, left axis deviation, poor R wave progression, cannot exclude anteroseptal infarct old.  No evidence of ischemia.   No significant change from KG 05/04/2019.  Assessment     ICD-10-CM   1. Coronary artery disease of native artery of native heart with stable angina pectoris (HCC)  I25.118 EKG 12-Lead    Alirocumab (PRALUENT) 150 MG/ML SOAJ  2. Pure hypercholesterolemia  E78.00 Alirocumab (PRALUENT) 150 MG/ML SOAJ    Lipid Panel With LDL/HDL Ratio  3. Essential hypertension  I10 valsartan (DIOVAN) 80 MG tablet   Meds ordered this encounter  Medications  . Alirocumab (PRALUENT) 150 MG/ML SOAJ    Sig: Inject 1 pen into the skin every 14 (fourteen) days.    Dispense:  6 mL    Refill:  3    6 pens with 3 refills  . valsartan (DIOVAN) 80 MG tablet    Sig: Take 0.5 tablets (40 mg total) by mouth daily.    Dispense:  90 tablet    Refill:  3    Medications Discontinued During This Encounter  Medication Reason  . benzonatate (TESSALON) 200 MG capsule Patient Preference  . Alirocumab (PRALUENT) 75 MG/ML SOAJ Dose change  . clopidogrel (PLAVIX) 75 MG tablet Completed Course  . valsartan (DIOVAN) 80 MG tablet     Recommendations:   Tommy Goostree Sr.  is a 69 y.o. caucasian male with coronary artery disease, last cardiac catheterization on 05/29/2017 when he presented with unstable anginal-like symptoms revealing widely patent stent to his LAD, RCA and circumflex and also balloon angioplasty site to D1 and distal LAD.  Fortunately his last PCI has been in 2016.  Past medical history significant for hypertension, hyperlipidemia, GERD, Barrett's esophagus, statin intolerance, uncontrolled diabetes mellitus and also severe restless leg syndrome.  He was admitted to the hospital after Covid 19  infection on 04/09/2019 and was again readmitted on 05/01/2019 and received remdesivir and also Decadron.   He activated EMS last night as he was  not feeling well and felt extremely dizzy and weak and found to be hypotensive and also dropped his blood sugar at the same time.  EKG was normal and he was resuscitated with IV fluids and felt well.  Today he is asymptomatic.  States that he has recovered well from recent COVID-19 infection.  Due to atypical chest pain, he was restarted back on Plavix after his recent Covid infection.  Advised him to discontinue this for now.  Completed the course.  I reviewed his labs, diabetes continues to be still uncontrolled which is trying to do his best with regard to making changes to his diet.  Blood pressures well controlled in fact very soft, in view of above symptoms of low blood pressure, I reduce the dose of valsartan from 80 mg to 40 mg daily.  Lipids are not well controlled, LDL goal less than 70.  Probably try to achieve closer to 50.  We will increase his Praluent from 75 mg 250 mg every 2 weeks.  We will repeat lipid profile testing in 1 month.  Unless issues, I will see him back in 6 months.   [name], MD, [facility] 12/01/2019, 3:10 PM Office: [phone]

## 2019-12-03 ENCOUNTER — Encounter (HOSPITAL_COMMUNITY): Admission: RE | Admit: 2019-12-03 | Payer: Medicare Other | Source: Ambulatory Visit

## 2019-12-03 ENCOUNTER — Other Ambulatory Visit (HOSPITAL_COMMUNITY): Payer: Medicare Other

## 2019-12-03 ENCOUNTER — Other Ambulatory Visit (HOSPITAL_COMMUNITY)
Admission: RE | Admit: 2019-12-03 | Discharge: 2019-12-03 | Disposition: A | Discharge status: Home / Self Care | Payer: Medicare Other | Source: Ambulatory Visit | Attending: Orthopedic Surgery | Admitting: Orthopedic Surgery

## 2019-12-03 ENCOUNTER — Encounter (HOSPITAL_COMMUNITY)
Admission: RE | Admit: 2019-12-03 | Discharge: 2019-12-03 | Disposition: A | Discharge status: Home / Self Care | Payer: Medicare Other | Source: Ambulatory Visit | Attending: Orthopedic Surgery | Admitting: Orthopedic Surgery

## 2019-12-03 ENCOUNTER — Other Ambulatory Visit: Payer: Self-pay

## 2019-12-03 ENCOUNTER — Encounter (HOSPITAL_COMMUNITY): Payer: Medicare Other

## 2019-12-03 DIAGNOSIS — Z01812 Encounter for preprocedural laboratory examination: Secondary | ICD-10-CM | POA: Diagnosis not present

## 2019-12-03 DIAGNOSIS — Z20822 Contact with and (suspected) exposure to covid-19: Secondary | ICD-10-CM | POA: Insufficient documentation

## 2019-12-03 LAB — COMPREHENSIVE METABOLIC PANEL
ALT: 47 U/L — ABNORMAL HIGH (ref 0–44)
AST: 34 U/L (ref 15–41)
Albumin: 4.3 g/dL (ref 3.5–5.0)
Alkaline Phosphatase: 48 U/L (ref 38–126)
Anion gap: 10 (ref 5–15)
BUN: 17 mg/dL (ref 8–23)
CO2: 26 mmol/L (ref 22–32)
Calcium: 9.1 mg/dL (ref 8.9–10.3)
Chloride: 101 mmol/L (ref 98–111)
Creatinine, Ser: 1.03 mg/dL (ref 0.61–1.24)
GFR calc Af Amer: >60 mL/min (ref >60)
GFR calc non Af Amer: >60 mL/min (ref >60)
Glucose, Bld: 203 mg/dL — ABNORMAL HIGH (ref 70–99)
Potassium: 4.2 mmol/L (ref 3.5–5.1)
Sodium: 137 mmol/L (ref 135–145)
Total Bilirubin: 0.8 mg/dL (ref 0.3–1.2)
Total Protein: 7 g/dL (ref 6.5–8.1)

## 2019-12-03 LAB — CBC
HCT: 43.4 % (ref 39.0–52.0)
Hemoglobin: 14.8 g/dL (ref 13.0–17.0)
MCH: 30.4 pg (ref 26.0–34.0)
MCHC: 34.1 g/dL (ref 30.0–36.0)
MCV: 89.1 fL (ref 80.0–100.0)
Platelets: 155 10*3/uL (ref 150–400)
RBC: 4.87 MIL/uL (ref 4.22–5.81)
RDW: 12.9 % (ref 11.5–15.5)
WBC: 5.2 10*3/uL (ref 4.0–10.5)
nRBC: 0 % (ref 0.0–0.2)

## 2019-12-03 LAB — PROTIME-INR
INR: 1 (ref 0.8–1.2)
Prothrombin Time: 12.4 seconds (ref 11.4–15.2)

## 2019-12-03 LAB — SARS CORONAVIRUS 2 (TAT 6-24 HRS): SARS Coronavirus 2: NEGATIVE

## 2019-12-06 ENCOUNTER — Encounter (HOSPITAL_COMMUNITY): Payer: Medicare Other

## 2019-12-06 ENCOUNTER — Other Ambulatory Visit (HOSPITAL_COMMUNITY): Payer: Medicare Other

## 2019-12-07 ENCOUNTER — Ambulatory Visit (HOSPITAL_BASED_OUTPATIENT_CLINIC_OR_DEPARTMENT_OTHER)
Admission: RE | Admit: 2019-12-07 | Discharge: 2019-12-07 | Disposition: A | Discharge status: Home / Self Care | Payer: Medicare Other | Attending: Orthopedic Surgery | Admitting: Orthopedic Surgery

## 2019-12-07 ENCOUNTER — Ambulatory Visit (HOSPITAL_BASED_OUTPATIENT_CLINIC_OR_DEPARTMENT_OTHER): Payer: Medicare Other | Admitting: Anesthesiology

## 2019-12-07 ENCOUNTER — Other Ambulatory Visit: Payer: Self-pay

## 2019-12-07 ENCOUNTER — Encounter (HOSPITAL_BASED_OUTPATIENT_CLINIC_OR_DEPARTMENT_OTHER)
Admission: RE | Disposition: A | Discharge status: Home / Self Care | Payer: Self-pay | Source: Home / Self Care | Attending: Orthopedic Surgery

## 2019-12-07 ENCOUNTER — Encounter (HOSPITAL_BASED_OUTPATIENT_CLINIC_OR_DEPARTMENT_OTHER): Payer: Self-pay | Admitting: Orthopedic Surgery

## 2019-12-07 DIAGNOSIS — K219 Gastro-esophageal reflux disease without esophagitis: Secondary | ICD-10-CM | POA: Insufficient documentation

## 2019-12-07 DIAGNOSIS — Z888 Allergy status to other drugs, medicaments and biological substances status: Secondary | ICD-10-CM | POA: Diagnosis not present

## 2019-12-07 DIAGNOSIS — S43431A Superior glenoid labrum lesion of right shoulder, initial encounter: Secondary | ICD-10-CM | POA: Diagnosis not present

## 2019-12-07 DIAGNOSIS — S46011A Strain of muscle(s) and tendon(s) of the rotator cuff of right shoulder, initial encounter: Secondary | ICD-10-CM | POA: Diagnosis not present

## 2019-12-07 DIAGNOSIS — M24111 Other articular cartilage disorders, right shoulder: Secondary | ICD-10-CM | POA: Diagnosis not present

## 2019-12-07 DIAGNOSIS — X58XXXA Exposure to other specified factors, initial encounter: Secondary | ICD-10-CM | POA: Diagnosis not present

## 2019-12-07 DIAGNOSIS — Z7902 Long term (current) use of antithrombotics/antiplatelets: Secondary | ICD-10-CM | POA: Insufficient documentation

## 2019-12-07 DIAGNOSIS — I2510 Atherosclerotic heart disease of native coronary artery without angina pectoris: Secondary | ICD-10-CM | POA: Diagnosis not present

## 2019-12-07 DIAGNOSIS — Z8616 Personal history of COVID-19: Secondary | ICD-10-CM | POA: Insufficient documentation

## 2019-12-07 DIAGNOSIS — Z8719 Personal history of other diseases of the digestive system: Secondary | ICD-10-CM | POA: Insufficient documentation

## 2019-12-07 DIAGNOSIS — M7541 Impingement syndrome of right shoulder: Secondary | ICD-10-CM | POA: Diagnosis not present

## 2019-12-07 DIAGNOSIS — Z955 Presence of coronary angioplasty implant and graft: Secondary | ICD-10-CM | POA: Insufficient documentation

## 2019-12-07 DIAGNOSIS — E119 Type 2 diabetes mellitus without complications: Secondary | ICD-10-CM | POA: Insufficient documentation

## 2019-12-07 DIAGNOSIS — M19011 Primary osteoarthritis, right shoulder: Secondary | ICD-10-CM | POA: Diagnosis not present

## 2019-12-07 DIAGNOSIS — E785 Hyperlipidemia, unspecified: Secondary | ICD-10-CM | POA: Insufficient documentation

## 2019-12-07 DIAGNOSIS — G2581 Restless legs syndrome: Secondary | ICD-10-CM | POA: Diagnosis not present

## 2019-12-07 DIAGNOSIS — Z794 Long term (current) use of insulin: Secondary | ICD-10-CM | POA: Diagnosis not present

## 2019-12-07 DIAGNOSIS — Z7982 Long term (current) use of aspirin: Secondary | ICD-10-CM | POA: Insufficient documentation

## 2019-12-07 DIAGNOSIS — Z79899 Other long term (current) drug therapy: Secondary | ICD-10-CM | POA: Insufficient documentation

## 2019-12-07 DIAGNOSIS — M7521 Bicipital tendinitis, right shoulder: Secondary | ICD-10-CM | POA: Diagnosis not present

## 2019-12-07 DIAGNOSIS — G8918 Other acute postprocedural pain: Secondary | ICD-10-CM | POA: Diagnosis not present

## 2019-12-07 DIAGNOSIS — M75101 Unspecified rotator cuff tear or rupture of right shoulder, not specified as traumatic: Secondary | ICD-10-CM | POA: Diagnosis not present

## 2019-12-07 HISTORY — DX: Effusion, left foot: M25.475

## 2019-12-07 HISTORY — PX: BICEPT TENODESIS: SHX5116

## 2019-12-07 HISTORY — DX: Effusion, right foot: M25.474

## 2019-12-07 HISTORY — DX: Effusion, left ankle: M25.472

## 2019-12-07 HISTORY — DX: Effusion, right ankle: M25.471

## 2019-12-07 LAB — POCT I-STAT, CHEM 8
BUN: 19 mg/dL (ref 8–23)
Calcium, Ion: 1.25 mmol/L (ref 1.15–1.40)
Chloride: 100 mmol/L (ref 98–111)
Creatinine, Ser: 1 mg/dL (ref 0.61–1.24)
Glucose, Bld: 235 mg/dL — ABNORMAL HIGH (ref 70–99)
HCT: 41 % (ref 39.0–52.0)
Hemoglobin: 13.9 g/dL (ref 13.0–17.0)
Potassium: 4.5 mmol/L (ref 3.5–5.1)
Sodium: 137 mmol/L (ref 135–145)
TCO2: 25 mmol/L (ref 22–32)

## 2019-12-07 LAB — GLUCOSE, CAPILLARY: Glucose-Capillary: 191 mg/dL — ABNORMAL HIGH (ref 70–99)

## 2019-12-07 SURGERY — SHOULDER ARTHROSCOPY WITH SUBACROMIAL DECOMPRESSION AND DISTAL CLAVICLE EXCISION
Anesthesia: Regional | Site: Shoulder | Laterality: Right

## 2019-12-07 MED ORDER — FENTANYL CITRATE (PF) 100 MCG/2ML IJ SOLN
INTRAMUSCULAR | Status: DC | PRN
  Administered 2019-12-07: 50 ug via INTRAVENOUS

## 2019-12-07 MED ORDER — CEFAZOLIN SODIUM-DEXTROSE 2-4 GM/100ML-% IV SOLN
2.0000 g | INTRAVENOUS | Status: AC
  Administered 2019-12-07: 2 g via INTRAVENOUS

## 2019-12-07 MED ORDER — BUPIVACAINE HCL (PF) 0.25 % IJ SOLN
INTRAMUSCULAR | Status: DC | PRN
  Administered 2019-12-07: 20 mL

## 2019-12-07 MED ORDER — MENTHOL 3 MG MT LOZG: 1.0000 | LOZENGE | OROMUCOSAL | Status: DC | PRN

## 2019-12-07 MED ORDER — PROPOFOL 10 MG/ML IV BOLUS
INTRAVENOUS | Status: AC
  Filled 2019-12-07: qty 20

## 2019-12-07 MED ORDER — SUCCINYLCHOLINE CHLORIDE 20 MG/ML IJ SOLN
INTRAMUSCULAR | Status: DC | PRN
  Administered 2019-12-07: 150 mg via INTRAVENOUS

## 2019-12-07 MED ORDER — BISACODYL 10 MG RE SUPP: 10.0000 mg | Freq: Every day | RECTAL | Status: DC | PRN

## 2019-12-07 MED ORDER — ACETAMINOPHEN 325 MG PO TABS: 325.0000 mg | ORAL_TABLET | Freq: Four times a day (QID) | ORAL | Status: DC | PRN

## 2019-12-07 MED ORDER — SUCCINYLCHOLINE CHLORIDE 200 MG/10ML IV SOSY
PREFILLED_SYRINGE | INTRAVENOUS | Status: AC
  Filled 2019-12-07: qty 10

## 2019-12-07 MED ORDER — BUPIVACAINE LIPOSOME 1.3 % IJ SUSP
INTRAMUSCULAR | Status: DC | PRN
  Administered 2019-12-07: 10 mL

## 2019-12-07 MED ORDER — MORPHINE SULFATE (PF) 4 MG/ML IV SOLN: 0.5000 mg | INTRAVENOUS | Status: DC | PRN

## 2019-12-07 MED ORDER — ALUM & MAG HYDROXIDE-SIMETH 200-200-20 MG/5ML PO SUSP: 30.0000 mL | ORAL | Status: DC | PRN

## 2019-12-07 MED ORDER — METHYLPREDNISOLONE ACETATE 80 MG/ML IJ SUSP
INTRAMUSCULAR | Status: DC | PRN
  Administered 2019-12-07: 80 mg

## 2019-12-07 MED ORDER — HYDROCODONE-ACETAMINOPHEN 5-325 MG PO TABS: 1.0000 | ORAL_TABLET | ORAL | Status: DC | PRN

## 2019-12-07 MED ORDER — PHENYLEPHRINE 40 MCG/ML (10ML) SYRINGE FOR IV PUSH (FOR BLOOD PRESSURE SUPPORT)
PREFILLED_SYRINGE | INTRAVENOUS | Status: AC
  Filled 2019-12-07: qty 20

## 2019-12-07 MED ORDER — METHOCARBAMOL 500 MG PO TABS: 500.0000 mg | ORAL_TABLET | Freq: Four times a day (QID) | ORAL | Status: DC | PRN

## 2019-12-07 MED ORDER — PROPOFOL 10 MG/ML IV BOLUS
INTRAVENOUS | Status: DC | PRN
  Administered 2019-12-07: 200 mg via INTRAVENOUS

## 2019-12-07 MED ORDER — CEFAZOLIN SODIUM-DEXTROSE 2-4 GM/100ML-% IV SOLN
INTRAVENOUS | Status: AC
  Filled 2019-12-07: qty 100

## 2019-12-07 MED ORDER — ONDANSETRON HCL 4 MG/2ML IJ SOLN
INTRAMUSCULAR | Status: DC | PRN
  Administered 2019-12-07: 4 mg via INTRAVENOUS

## 2019-12-07 MED ORDER — BUPIVACAINE-EPINEPHRINE 0.25% -1:200000 IJ SOLN
INTRAMUSCULAR | Status: DC | PRN
  Administered 2019-12-07: 4 mL

## 2019-12-07 MED ORDER — DOCUSATE SODIUM 100 MG PO CAPS: 100.0000 mg | ORAL_CAPSULE | Freq: Two times a day (BID) | ORAL | Status: DC

## 2019-12-07 MED ORDER — MIDAZOLAM HCL 2 MG/2ML IJ SOLN
2.0000 mg | Freq: Once | INTRAMUSCULAR | Status: AC
  Administered 2019-12-07: 2 mg via INTRAVENOUS

## 2019-12-07 MED ORDER — POLYETHYLENE GLYCOL 3350 17 G PO PACK: 17.0000 g | PACK | Freq: Every day | ORAL | Status: DC | PRN

## 2019-12-07 MED ORDER — ONDANSETRON HCL 4 MG/2ML IJ SOLN: 4.0000 mg | Freq: Four times a day (QID) | INTRAMUSCULAR | Status: DC | PRN

## 2019-12-07 MED ORDER — HYDROCODONE-ACETAMINOPHEN 7.5-325 MG PO TABS: 1.0000 | ORAL_TABLET | ORAL | Status: DC | PRN

## 2019-12-07 MED ORDER — MIDAZOLAM HCL 2 MG/2ML IJ SOLN
INTRAMUSCULAR | Status: AC
  Filled 2019-12-07: qty 2

## 2019-12-07 MED ORDER — DIPHENHYDRAMINE HCL 12.5 MG/5ML PO ELIX: 12.5000 mg | ORAL_SOLUTION | ORAL | Status: DC | PRN

## 2019-12-07 MED ORDER — DEXAMETHASONE SODIUM PHOSPHATE 4 MG/ML IJ SOLN
INTRAMUSCULAR | Status: DC | PRN
  Administered 2019-12-07: 4 mg via INTRAVENOUS

## 2019-12-07 MED ORDER — OXYCODONE HCL 5 MG PO TABS: 5.0000 mg | ORAL_TABLET | Freq: Once | ORAL | Status: DC | PRN

## 2019-12-07 MED ORDER — FENTANYL CITRATE (PF) 100 MCG/2ML IJ SOLN
100.0000 ug | Freq: Once | INTRAMUSCULAR | Status: AC
  Administered 2019-12-07: 50 ug via INTRAVENOUS

## 2019-12-07 MED ORDER — ONDANSETRON HCL 4 MG/2ML IJ SOLN
INTRAMUSCULAR | Status: AC
  Filled 2019-12-07: qty 2

## 2019-12-07 MED ORDER — MAGNESIUM CITRATE PO SOLN
1.0000 | Freq: Once | ORAL | Status: DC | PRN
  Filled 2019-12-07: qty 296

## 2019-12-07 MED ORDER — SODIUM CHLORIDE 0.9 % IR SOLN
Status: DC | PRN
  Administered 2019-12-07 (×5): 3000 mL

## 2019-12-07 MED ORDER — PHENYLEPHRINE 40 MCG/ML (10ML) SYRINGE FOR IV PUSH (FOR BLOOD PRESSURE SUPPORT)
PREFILLED_SYRINGE | INTRAVENOUS | Status: DC | PRN
  Administered 2019-12-07: 80 ug via INTRAVENOUS
  Administered 2019-12-07: 40 ug via INTRAVENOUS
  Administered 2019-12-07 (×4): 80 ug via INTRAVENOUS
  Administered 2019-12-07 (×3): 40 ug via INTRAVENOUS

## 2019-12-07 MED ORDER — PHENOL 1.4 % MT LIQD: 1.0000 | OROMUCOSAL | Status: DC | PRN

## 2019-12-07 MED ORDER — FENTANYL CITRATE (PF) 100 MCG/2ML IJ SOLN
INTRAMUSCULAR | Status: AC
  Filled 2019-12-07: qty 2

## 2019-12-07 MED ORDER — FENTANYL CITRATE (PF) 100 MCG/2ML IJ SOLN: 25.0000 ug | INTRAMUSCULAR | Status: DC | PRN

## 2019-12-07 MED ORDER — LIDOCAINE 2% (20 MG/ML) 5 ML SYRINGE
INTRAMUSCULAR | Status: AC
  Filled 2019-12-07: qty 5

## 2019-12-07 MED ORDER — ACETAMINOPHEN 500 MG PO TABS: 500.0000 mg | ORAL_TABLET | Freq: Four times a day (QID) | ORAL | Status: DC

## 2019-12-07 MED ORDER — DEXAMETHASONE SODIUM PHOSPHATE 10 MG/ML IJ SOLN
INTRAMUSCULAR | Status: AC
  Filled 2019-12-07: qty 1

## 2019-12-07 MED ORDER — PROMETHAZINE HCL 25 MG/ML IJ SOLN: 6.2500 mg | INTRAMUSCULAR | Status: DC | PRN

## 2019-12-07 MED ORDER — OXYCODONE-ACETAMINOPHEN 5-325 MG PO TABS: 1.0000 | ORAL_TABLET | ORAL | 0 refills | Status: DC | PRN

## 2019-12-07 MED ORDER — METOCLOPRAMIDE HCL 5 MG PO TABS
5.0000 mg | ORAL_TABLET | Freq: Three times a day (TID) | ORAL | Status: DC | PRN
  Filled 2019-12-07: qty 2

## 2019-12-07 MED ORDER — METHOCARBAMOL 1000 MG/10ML IJ SOLN
500.0000 mg | Freq: Four times a day (QID) | INTRAVENOUS | Status: DC | PRN
  Filled 2019-12-07: qty 5

## 2019-12-07 MED ORDER — LACTATED RINGERS IV SOLN: INTRAVENOUS | Status: DC

## 2019-12-07 MED ORDER — LIDOCAINE 2% (20 MG/ML) 5 ML SYRINGE
INTRAMUSCULAR | Status: DC | PRN
  Administered 2019-12-07: 60 mg via INTRAVENOUS

## 2019-12-07 MED ORDER — ONDANSETRON HCL 4 MG PO TABS: 4.0000 mg | ORAL_TABLET | Freq: Four times a day (QID) | ORAL | Status: DC | PRN

## 2019-12-07 MED ORDER — METOCLOPRAMIDE HCL 5 MG/ML IJ SOLN: 5.0000 mg | Freq: Three times a day (TID) | INTRAMUSCULAR | Status: DC | PRN

## 2019-12-07 MED ORDER — OXYCODONE HCL 5 MG/5ML PO SOLN: 5.0000 mg | Freq: Once | ORAL | Status: DC | PRN

## 2019-12-07 SURGICAL SUPPLY — 46 items
AID PSTN UNV HD RSTRNT DISP (MISCELLANEOUS) ×1
ANCH SUT 2.9 PUSHLOCK ANCH (Orthopedic Implant) ×1 IMPLANT
ANCH SUT SWLK 19.1X4.75 (Anchor) ×2 IMPLANT
ANCHOR SUT BIO SW 4.75X19.1 (Anchor) ×4 IMPLANT
APL PRP STRL LF DISP 70% ISPRP (MISCELLANEOUS) ×1
BLADE SURG 15 STRL LF DISP TIS (BLADE) IMPLANT
BLADE SURG 15 STRL SS (BLADE)
BURR CLEARCUT OVAL 5.5X13 (MISCELLANEOUS) IMPLANT
BURR OVAL 12 FL 5.5X13 (MISCELLANEOUS)
CANNULA 5.75X71 LONG (CANNULA) IMPLANT
CANNULA TWIST IN 8.25X7CM (CANNULA) ×2 IMPLANT
CHLORAPREP W/TINT 26 (MISCELLANEOUS) ×2 IMPLANT
CLSR STERI-STRIP ANTIMIC 1/2X4 (GAUZE/BANDAGES/DRESSINGS) ×2 IMPLANT
COVER WAND RF STERILE (DRAPES) ×2 IMPLANT
DISSECTOR 4.0MM X 13CM (MISCELLANEOUS) IMPLANT
DRAPE IMP U-DRAPE 54X76 (DRAPES) ×2 IMPLANT
DRAPE INCISE IOBAN 66X45 STRL (DRAPES) ×2 IMPLANT
DRAPE SHOULDER BEACH CHAIR (DRAPES) ×2 IMPLANT
DRAPE U-SHAPE 47X51 STRL (DRAPES) ×2 IMPLANT
DRSG EMULSION OIL 3X3 NADH (GAUZE/BANDAGES/DRESSINGS) ×2 IMPLANT
DRSG PAD ABDOMINAL 8X10 ST (GAUZE/BANDAGES/DRESSINGS) ×2 IMPLANT
GAUZE SPONGE 4X4 12PLY STRL (GAUZE/BANDAGES/DRESSINGS) ×2 IMPLANT
GLOVE BIO SURGEON STRL SZ 6.5 (GLOVE) ×2 IMPLANT
GLOVE BIO SURGEON STRL SZ7.5 (GLOVE) ×4 IMPLANT
GLOVE BIOGEL PI IND STRL 6.5 (GLOVE) ×1 IMPLANT
GLOVE BIOGEL PI IND STRL 8 (GLOVE) ×2 IMPLANT
GLOVE BIOGEL PI INDICATOR 6.5 (GLOVE) ×1
GLOVE BIOGEL PI INDICATOR 8 (GLOVE) ×2
GOWN STRL REUS W/ TWL LRG LVL3 (GOWN DISPOSABLE) ×2 IMPLANT
GOWN STRL REUS W/TWL LRG LVL3 (GOWN DISPOSABLE) ×4
IV NS IRRIG 3000ML ARTHROMATIC (IV SOLUTION) ×10 IMPLANT
KIT INSERTION 2.9 PUSHLOCK (KITS) ×2 IMPLANT
KIT PUSHLOCK 2.9 HIP (KITS) IMPLANT
MANIFOLD NEPTUNE II (INSTRUMENTS) ×2 IMPLANT
NEEDLE SCORPION MULTI FIRE (NEEDLE) ×2 IMPLANT
PACK ARTHROSCOPY DSU (CUSTOM PROCEDURE TRAY) ×2 IMPLANT
PACK BASIN DAY SURGERY FS (CUSTOM PROCEDURE TRAY) ×2 IMPLANT
PASSER SUT SWIFTSTITCH HIP CRT (INSTRUMENTS) ×2 IMPLANT
PORT APPOLLO RF 90DEGREE MULTI (SURGICAL WAND) ×2 IMPLANT
RESTRAINT HEAD UNIVERSAL NS (MISCELLANEOUS) ×2 IMPLANT
SLEEVE SCD COMPRESS KNEE MED (MISCELLANEOUS) ×2 IMPLANT
SLING ARM FOAM STRAP LRG (SOFTGOODS) ×2 IMPLANT
SUT ETHILON 2 0 FS 18 (SUTURE) ×2 IMPLANT
SYSTEM IMPL TENODESIS LNT 2.9 (Orthopedic Implant) ×2 IMPLANT
TOWEL OR 17X26 10 PK STRL BLUE (TOWEL DISPOSABLE) ×2 IMPLANT
TUBING ARTHROSCOPY IRRIG 16FT (MISCELLANEOUS) ×2 IMPLANT

## 2019-12-07 NOTE — Anesthesia Preprocedure Evaluation (Addendum)
Anesthesia Evaluation  Patient identified by MRN, date of birth, ID band  Reviewed: Allergy & Precautions, NPO status , Patient's Chart, lab work & pertinent test results  History of Anesthesia Complications (+) PONV  Airway Mallampati: II  TM Distance: >3 FB Neck ROM: Limited    Dental no notable dental hx.    Pulmonary pneumonia, resolved,  H/o COVID pneumonia 05/2019   breath sounds clear to auscultation + decreased breath sounds      Cardiovascular hypertension, + CAD, + Cardiac Stents and + Peripheral Vascular Disease  Normal cardiovascular exam Rhythm:Regular Rate:Normal     Neuro/Psych  Headaches, negative psych ROS   GI/Hepatic GERD  ,  Endo/Other  diabetes, Poorly Controlled, Type 2, Insulin Dependent  Renal/GU Renal InsufficiencyRenal disease     Musculoskeletal  (+) Arthritis ,   Abdominal Normal abdominal exam  (+)   Peds  Hematology negative hematology ROS (+)   Anesthesia Other Findings   Reproductive/Obstetrics                            Anesthesia Physical Anesthesia Plan  ASA: III  Anesthesia Plan: General and Regional   Post-op Pain Management: GA combined w/ Regional for post-op pain   Induction: Intravenous  PONV Risk Score and Plan: 3 and Midazolam, Dexamethasone, Ondansetron and Treatment may vary due to age or medical condition  Airway Management Planned: Oral ETT  Additional Equipment:   Intra-op Plan:   Post-operative Plan: Extubation in OR  Informed Consent: I have reviewed the patients History and Physical, chart, labs and discussed the procedure including the risks, benefits and alternatives for the proposed anesthesia with the patient or authorized representative who has indicated his/her understanding and acceptance.     Dental advisory given  Plan Discussed with:   Anesthesia Plan Comments: (Interscalene block with exparel. Will use lower dose  bupivacaine 0.25% due to h/o of COVID pneumonia requiring hospitalization. GETA. )       Anesthesia Quick Evaluation

## 2019-12-07 NOTE — Transfer of Care (Signed)
Immediate Anesthesia Transfer of Care Note  Patient: Kenneth Humbarger Sr.  Procedure(s) Performed: Procedure(s) (LRB): RIGHT SHOULDER ARTHROSCOPY WITH SUBACROMIAL DECOMPRESSION AND DISTAL CLAVICLE EXCISION, ROTATOR CUFF REPAIR (Right) RIGHT BICEPS TENODESIS (Right)  Patient Location: PACU  Anesthesia Type: General  Level of Consciousness: awake, oriented, sedated and patient cooperative  Airway & Oxygen Therapy: Patient Spontanous Breathing and Patient connected to face mask oxygen  Post-op Assessment: Report given to PACU RN and Post -op Vital signs reviewed and stable  Post vital signs: Reviewed and stable  Complications: No apparent anesthesia complications Last Vitals:  Vitals Value Taken Time  BP    Temp    Pulse 76 12/07/19 1204  Resp 19 12/07/19 1204  SpO2 91 % 12/07/19 1204  Vitals shown include unvalidated device data.  Last Pain:  Vitals:   12/07/19 0912  TempSrc: Oral         Complications: No complications documented.

## 2019-12-07 NOTE — Anesthesia Procedure Notes (Signed)
Procedure Name: Intubation Date/Time: 12/07/2019 10:09 AM Performed by: Montez Morita, Arie Gable W, CRNA Pre-anesthesia Checklist: Patient identified, Emergency Drugs available, Suction available and Patient being monitored Patient Re-evaluated:Patient Re-evaluated prior to induction Oxygen Delivery Method: Circle system utilized Preoxygenation: Pre-oxygenation with 100% oxygen Induction Type: IV induction Ventilation: Mask ventilation without difficulty Laryngoscope Size: Miller and 2 Grade View: Grade I Tube type: Oral Tube size: 7.5 mm Number of attempts: 1 Airway Equipment and Method: Stylet Placement Confirmation: ETT inserted through vocal cords under direct vision,  positive ETCO2 and breath sounds checked- equal and bilateral Secured at: 22 cm Tube secured with: Tape Dental Injury: Teeth and Oropharynx as per pre-operative assessment

## 2019-12-07 NOTE — Progress Notes (Signed)
AssistedDr. Bass with right, ultrasound guided, interscalene  block. Side rails up, monitors on throughout procedure. See vital signs in flow sheet. Tolerated Procedure well.  

## 2019-12-07 NOTE — Discharge Instructions (Signed)
Diet: As you were doing prior to hospitalization   Shower:  May shower but keep the wounds dry, use an occlusive plastic wrap, NO SOAKING IN TUB.  If the bandage gets wet, change with a clean dry gauze.  If you have a splint on, leave the splint in place and keep the splint dry with a plastic bag.  Dressing:  You may change your dressing 3-5 days after surgery, unless you have a splint.  If you have a splint, then just leave the splint in place and we will change your bandages during your first follow-up appointment.    If you had hand or foot surgery, we will plan to remove your stitches in about 2 weeks in the office.  For all other surgeries, there are sticky tapes (steri-strips) on your wounds and all the stitches are absorbable.  Leave the steri-strips in place when changing your dressings, they will peel off with time, usually 2-3 weeks.  Activity:  Increase activity slowly as tolerated, but follow the weight bearing instructions below.  The rules on driving is that you can not be taking narcotics while you drive, and you must feel in control of the vehicle.    Weight Bearing:   Do not lifting anything with right arm. Stay in sling until follow up with Dr. Eulah Pont.  To prevent constipation: you may use a stool softener such as -  Colace (over the counter) 100 mg by mouth twice a day  Drink plenty of fluids (prune juice may be helpful) and high fiber foods Miralax (over the counter) for constipation as needed.    Itching:  If you experience itching with your medications, try taking only a single pain pill, or even half a pain pill at a time.  You may take up to 10 pain pills per day, and you can also use benadryl over the counter for itching or also to help with sleep.   Precautions:  If you experience chest pain or shortness of breath - call 911 immediately for transfer to the hospital emergency department!!  If you develop a fever greater that 101 F, purulent drainage from wound,  increased redness or drainage from wound, or calf pain -- Call the office at [phone]                                                Follow- Up Appointment:  Please call for an appointment to be seen in 2 weeks Olivia - [phone]   Regional Anesthesia Blocks  1. Numbness or the inability to move the "blocked" extremity may last from 3-48 hours after placement. The length of time depends on the medication injected and your individual response to the medication. If the numbness is not going away after 48 hours, call your surgeon.  2. The extremity that is blocked will need to be protected until the numbness is gone and the  Strength has returned. Because you cannot feel it, you will need to take extra care to avoid injury. Because it may be weak, you may have difficulty moving it or using it. You may not know what position it is in without looking at it while the block is in effect.  3. For blocks in the legs and feet, returning to weight bearing and walking needs to be done carefully. You will need to wait until the numbness  is entirely gone and the strength has returned. You should be able to move your leg and foot normally before you try and bear weight or walk. You will need someone to be with you when you first try to ensure you do not fall and possibly risk injury.  4. Bruising and tenderness at the needle site are common side effects and will resolve in a few days.  5. Persistent numbness or new problems with movement should be communicated to the surgeon or the [facility] [phone] [facility] [phone]). Post Anesthesia Home Care Instructions  Activity: Get plenty of rest for the remainder of the day. A responsible adult should stay with you for 24 hours following the procedure.  For the next 24 hours, DO NOT: -Drive a car -Advertising copywriter -Drink alcoholic beverages -Take any medication unless instructed by your physician -Make any  legal decisions or sign important papers.  Meals: Start with liquid foods such as gelatin or soup. Progress to regular foods as tolerated. Avoid greasy, spicy, heavy foods. If nausea and/or vomiting occur, drink only clear liquids until the nausea and/or vomiting subsides. Call your physician if vomiting continues.  Special Instructions/Symptoms: Your throat may feel dry or sore from the anesthesia or the breathing tube placed in your throat during surgery. If this causes discomfort, gargle with warm salt water. The discomfort should disappear within 24 hours.  If you had a scopolamine patch placed behind your ear for the management of post- operative nausea and/or vomiting:  1. The medication in the patch is effective for 72 hours, after which it should be removed.  Wrap patch in a tissue and discard in the trash. Wash hands thoroughly with soap and water. 2. You may remove the patch earlier than 72 hours if you experience unpleasant side effects which may include dry mouth, dizziness or visual disturbances. 3. Avoid touching the patch. Wash your hands with soap and water after contact with the patch.

## 2019-12-07 NOTE — Interval H&P Note (Signed)
History and Physical Interval Note:  12/07/2019 8:50 AM  Kenneth Sic Sr.  has presented today for surgery, with the diagnosis of RIGHT SHOULDER BICEPS TENDINOPATHY, SLAP TEAR.  The various methods of treatment have been discussed with the patient and family. After consideration of risks, benefits and other options for treatment, the patient has consented to  Procedure(s): SHOULDER ARTHROSCOPY WITH SUBACROMIAL DECOMPRESSION AND DISTAL CLAVICLE EXCISION (Right) BICEPS TENODESIS (Right) as a surgical intervention.  The patient's history has been reviewed, patient examined, no change in status, stable for surgery.  I have reviewed the patient's chart and labs.  Questions were answered to the patient's satisfaction.     Sheral Apley

## 2019-12-07 NOTE — Anesthesia Postprocedure Evaluation (Signed)
Anesthesia Post Note  Patient: Kenneth Canal Sr.  Procedure(s) Performed: RIGHT SHOULDER ARTHROSCOPY WITH SUBACROMIAL DECOMPRESSION AND DISTAL CLAVICLE EXCISION, ROTATOR CUFF REPAIR (Right Shoulder) RIGHT BICEPS TENODESIS (Right Shoulder)     Patient location during evaluation: PACU Anesthesia Type: Regional and General Level of consciousness: sedated Pain management: pain level controlled Vital Signs Assessment: post-procedure vital signs reviewed and stable Respiratory status: spontaneous breathing and respiratory function stable Cardiovascular status: stable Postop Assessment: no apparent nausea or vomiting Anesthetic complications: no   No complications documented.  Last Vitals:  Vitals:   12/07/19 1245 12/07/19 1314  BP: (!) 176/115   Pulse: 69 74  Resp: 16 16  Temp:  (!) 36.3 C  SpO2: 96% 90%    Last Pain:  Vitals:   12/07/19 1300  TempSrc:   PainSc: 0-No pain                 Mellody Dance

## 2019-12-07 NOTE — Op Note (Signed)
12/07/2019  11:38 AM  PATIENT:  Kenneth Sic Sr.    PRE-OPERATIVE DIAGNOSIS:  RIGHT SHOULDER BICEPS TENDINOPATHY, SLAP TEAR  POST-OPERATIVE DIAGNOSIS:  Same  PROCEDURE:  RIGHT SHOULDER ARTHROSCOPY WITH SUBACROMIAL DECOMPRESSION AND DISTAL CLAVICLE EXCISION, ROTATOR CUFF REPAIR, RIGHT BICEPS TENODESIS  SURGEON:  Sheral Apley, MD  ASSISTANT: Daun Peacock, PA-C, he was present and scrubbed throughout the case, critical for completion in a timely fashion, and for retraction, instrumentation, and closure.   ANESTHESIA:   General  PREOPERATIVE INDICATIONS:  Kenneth Schellenberg Sr. is a  69 y.o. male with a diagnosis of RIGHT SHOULDER BICEPS TENDINOPATHY, SLAP TEAR who failed conservative measures and elected for surgical management.    The risks benefits and alternatives were discussed with the patient preoperatively including but not limited to the risks of infection, bleeding, nerve injury, cardiopulmonary complications, the need for revision surgery, among others, and the patient was willing to proceed.  OPERATIVE IMPLANTS: arthrex anchors  OPERATIVE FINDINGS: supraspinatous tear  BLOOD LOSS: minimal  COMPLICATIONS: none  OPERATIVE PROCEDURE:  Patient was identified in the preoperative holding area and site was marked by me He was transported to the operating theater and placed on the table in beach chair position taking care to pad all bony prominences. After a preincinduction time out anesthesia was induced. The right upper extremity was prepped and draped in normal sterile fashion and a pre-incision timeout was performed. Kenneth Sic Sr. received ancef for preoperative antibiotics.   Initially made a posterior arthroscopic portal and inserted the arthroscope into the glenohumeral joint. tour of the joint demonstrated the above operative findings  I created an anterior portal just lateral to the coracoid under direct visualization using a spinal needle.  I performed an extensive  debridement of the scarred synovial tissue and remaining structures superior labrum and superior subdeltoid bursa   I used a combination of biter and shaver to release the biceps tendon from the superior labrum and then used the shaver to debride the superior labrum to a smooth rim. I performed a loop and tack tenodesis  I then introduced the arthroscope into the subacromial space and brought the shaver into the anterior portal. I debrided the bursa for appropriate visualization.  I then performed a subacromial decompression using combination of the shaver ArthroCare and burr using a cutting block technique. As happy with the final elevation of the subacromial space on multiple portal views.  Next I turned my attention to the distal clavicle and through the anterior portal using the bur and shaver I was able to perform a distal clavicle excision. I then switched portals and inserted the arthroscope into the anterior portal and was happy with an appropriate resection of the distal clavicle.  I debrided his cuff tear in the supraspinatus and perfomred a 1x1 dual row repair  I was happy with the tendon apposition and there was minimal to no dog ear.  Next I removed all arthroscopic equipment expressed all fluid and closed the portals with a nylon stitch. A sterile dressing was applied the patient was taken the PACU in stable condition.  POST OPERATIVE PLAN: The patient will be in a sling full-time and keep the dressings clean dry and intact. DVT prophylaxis will consist of early ambulation

## 2019-12-07 NOTE — Anesthesia Procedure Notes (Signed)
Anesthesia Regional Block: Interscalene brachial plexus block   Pre-Anesthetic Checklist: ,, timeout performed, Correct Patient, Correct Site, Correct Laterality, Correct Procedure, Correct Position, site marked, Risks and benefits discussed,  Surgical consent,  Pre-op evaluation,  At surgeon's request and post-op pain management  Laterality: Right  Prep: alcohol swabs       Needles:  Injection technique: Single-shot  Needle Type: Echogenic Stimulator Needle     Needle Length: 5cm  Needle Gauge: 20     Additional Needles:   Procedures:,,,, ultrasound used (permanent image in chart),,,,  Narrative:  Start time: 12/07/2019 9:30 AM End time: 12/07/2019 9:35 AM Injection made incrementally with aspirations every 5 mL.  Performed by: Personally  Anesthesiologist: Mellody Dance, MD  Additional Notes: Standard monitors applied. Skin prepped. Good needle visualization with ultrasound. Injection made in 5cc increments with no resistance to injection. Patient tolerated the procedure well.

## 2019-12-08 ENCOUNTER — Encounter (HOSPITAL_BASED_OUTPATIENT_CLINIC_OR_DEPARTMENT_OTHER): Payer: Self-pay | Admitting: Orthopedic Surgery

## 2019-12-09 ENCOUNTER — Other Ambulatory Visit: Payer: Self-pay | Admitting: Internal Medicine

## 2019-12-09 DIAGNOSIS — IMO0002 Reserved for concepts with insufficient information to code with codable children: Secondary | ICD-10-CM

## 2019-12-09 NOTE — Telephone Encounter (Signed)
rx sent to pharmacy by e-script  

## 2019-12-14 ENCOUNTER — Other Ambulatory Visit: Payer: Self-pay | Admitting: *Deleted

## 2019-12-14 NOTE — Patient Outreach (Signed)
[facility]) Care Management  12/14/2019  Kenneth Baria Sr. 1950-09-28 [id number]  Unsuccessful outreach attempt made to patient. RN Health Coach left HIPAA compliant voicemail message along with her contact information.  Plan: RN Health Coach will call patient within the month of November.  [name] RN, BSN [facility] Care Management  RN Health Coach [phone] [email or web address]

## 2019-12-15 DIAGNOSIS — M19011 Primary osteoarthritis, right shoulder: Secondary | ICD-10-CM | POA: Diagnosis not present

## 2019-12-17 ENCOUNTER — Other Ambulatory Visit: Payer: Self-pay | Admitting: Cardiology

## 2019-12-18 ENCOUNTER — Other Ambulatory Visit: Payer: Self-pay | Admitting: Cardiology

## 2019-12-18 DIAGNOSIS — E7800 Pure hypercholesterolemia, unspecified: Secondary | ICD-10-CM

## 2019-12-21 ENCOUNTER — Telehealth: Payer: Self-pay | Admitting: *Deleted

## 2019-12-21 DIAGNOSIS — M25611 Stiffness of right shoulder, not elsewhere classified: Secondary | ICD-10-CM | POA: Diagnosis not present

## 2019-12-21 DIAGNOSIS — M25511 Pain in right shoulder: Secondary | ICD-10-CM | POA: Diagnosis not present

## 2019-12-21 DIAGNOSIS — M6281 Muscle weakness (generalized): Secondary | ICD-10-CM | POA: Diagnosis not present

## 2019-12-21 DIAGNOSIS — M75121 Complete rotator cuff tear or rupture of right shoulder, not specified as traumatic: Secondary | ICD-10-CM | POA: Diagnosis not present

## 2019-12-21 NOTE — Telephone Encounter (Signed)
Wife, [name], dropped off FMLA paperwork for herself. Needs the FMLA paperwork filled out for when she needs to take care of her husband and will not be able to go into work.  Employer: [employer] Placed paperwork in Dr. Ernest Mallick folder to review, fill out and sign.  Envelope included to be mailed to: FMLA Specialist-[facility]-FMLA  [id number] [place] [id number]

## 2019-12-23 DIAGNOSIS — M25611 Stiffness of right shoulder, not elsewhere classified: Secondary | ICD-10-CM | POA: Diagnosis not present

## 2019-12-23 DIAGNOSIS — M25511 Pain in right shoulder: Secondary | ICD-10-CM | POA: Diagnosis not present

## 2019-12-23 DIAGNOSIS — M6281 Muscle weakness (generalized): Secondary | ICD-10-CM | POA: Diagnosis not present

## 2019-12-23 DIAGNOSIS — M75121 Complete rotator cuff tear or rupture of right shoulder, not specified as traumatic: Secondary | ICD-10-CM | POA: Diagnosis not present

## 2019-12-24 ENCOUNTER — Ambulatory Visit: Payer: Medicare Other | Admitting: Cardiology

## 2019-12-27 NOTE — Telephone Encounter (Signed)
Wife came by the office to fill out her portion of the [facility] Paperwork. She stated that she needs the paperwork filled out for all of his diagnosis (heart, diabetes, etc) not just for shoulder surgery. Stated that the time for the shoulder surgery has already expired.   Dr. Renato Gails stated that she is only going to fill out for his shoulder surgery and allowed a month.   Wife stated that she does not need the paperwork then and asked for it to be shredded. Stated that she will either call his Cardiologist or talk with Dr. Renato Gails at patient's follow up appointment.

## 2019-12-27 NOTE — Telephone Encounter (Signed)
Paperwork received filled out by Dr. Renato Gails. Portion that patient needs to finish filling out before it can be faxed.   Called and spoke with Husband and he stated that he would get patient to give [place] a call.  Awaiting callback. (paperwork at Clinical Intake Desk)

## 2019-12-28 ENCOUNTER — Telehealth: Payer: Self-pay | Admitting: *Deleted

## 2019-12-28 DIAGNOSIS — M25511 Pain in right shoulder: Secondary | ICD-10-CM | POA: Diagnosis not present

## 2019-12-28 DIAGNOSIS — M25611 Stiffness of right shoulder, not elsewhere classified: Secondary | ICD-10-CM | POA: Diagnosis not present

## 2019-12-28 DIAGNOSIS — M6281 Muscle weakness (generalized): Secondary | ICD-10-CM | POA: Diagnosis not present

## 2019-12-28 DIAGNOSIS — M75121 Complete rotator cuff tear or rupture of right shoulder, not specified as traumatic: Secondary | ICD-10-CM | POA: Diagnosis not present

## 2019-12-28 NOTE — Telephone Encounter (Signed)
Patient called and stated that he is almost out of his Restless Leg medication Pramipexole. Stated that he has been having to take it Three times daily. Stated that he usually takes Two at bedtime but he has been having to take one around 5pm. Only has about 6-8 tablets left. Wants to know if a Rx can be sent in for Three/day.  Please Advise.

## 2019-12-28 NOTE — Telephone Encounter (Signed)
As with all of his medications, I'd prefer he discuss his desired changes with me before he makes them, but it's ok to order his pramipexole as he is now taking it (3 per day).  It looks like a nurse in another location noted that he took it differently (along with his insulin) but did not formally change it.

## 2019-12-29 ENCOUNTER — Other Ambulatory Visit: Payer: Self-pay | Admitting: Internal Medicine

## 2019-12-29 DIAGNOSIS — E1159 Type 2 diabetes mellitus with other circulatory complications: Secondary | ICD-10-CM

## 2019-12-29 DIAGNOSIS — IMO0002 Reserved for concepts with insufficient information to code with codable children: Secondary | ICD-10-CM

## 2019-12-29 MED ORDER — PRAMIPEXOLE DIHYDROCHLORIDE 1 MG PO TABS
ORAL_TABLET | ORAL | 1 refills | Status: DC
Start: 2019-12-29 — End: 2020-01-04

## 2019-12-29 NOTE — Telephone Encounter (Signed)
Confirmed with patient, 40 units daily.

## 2019-12-29 NOTE — Telephone Encounter (Signed)
Medication list updated and Rx sent to pharmacy  

## 2019-12-29 NOTE — Addendum Note (Signed)
Addended by: Nelda Severe A on: 12/29/2019 08:43 AM   Modules accepted: Orders

## 2019-12-29 NOTE — Telephone Encounter (Signed)
Called patient to confirm units, medication has 2 sets of instructions on current medication.  Left message on voicemail for patient to return call when available

## 2019-12-30 DIAGNOSIS — M25611 Stiffness of right shoulder, not elsewhere classified: Secondary | ICD-10-CM | POA: Diagnosis not present

## 2019-12-30 DIAGNOSIS — M6281 Muscle weakness (generalized): Secondary | ICD-10-CM | POA: Diagnosis not present

## 2019-12-30 DIAGNOSIS — M75121 Complete rotator cuff tear or rupture of right shoulder, not specified as traumatic: Secondary | ICD-10-CM | POA: Diagnosis not present

## 2019-12-30 DIAGNOSIS — M25511 Pain in right shoulder: Secondary | ICD-10-CM | POA: Diagnosis not present

## 2019-12-31 ENCOUNTER — Other Ambulatory Visit: Payer: Self-pay | Admitting: Cardiology

## 2020-01-01 ENCOUNTER — Other Ambulatory Visit: Payer: Self-pay | Admitting: Internal Medicine

## 2020-01-04 ENCOUNTER — Other Ambulatory Visit: Payer: Self-pay

## 2020-01-04 ENCOUNTER — Encounter: Payer: Self-pay | Admitting: Adult Health

## 2020-01-04 ENCOUNTER — Ambulatory Visit (INDEPENDENT_AMBULATORY_CARE_PROVIDER_SITE_OTHER): Payer: Medicare Other | Admitting: Adult Health

## 2020-01-04 VITALS — BP 140/86 | HR 75 | Temp 97.8°F | Ht 68.0 in | Wt 188.2 lb

## 2020-01-04 DIAGNOSIS — I25118 Atherosclerotic heart disease of native coronary artery with other forms of angina pectoris: Secondary | ICD-10-CM

## 2020-01-04 DIAGNOSIS — R059 Cough, unspecified: Secondary | ICD-10-CM

## 2020-01-04 DIAGNOSIS — J410 Simple chronic bronchitis: Secondary | ICD-10-CM | POA: Diagnosis not present

## 2020-01-04 DIAGNOSIS — J029 Acute pharyngitis, unspecified: Secondary | ICD-10-CM

## 2020-01-04 LAB — POCT INFLUENZA A/B
Influenza A, POC: NEGATIVE
Influenza B, POC: NEGATIVE

## 2020-01-04 MED ORDER — AZITHROMYCIN 250 MG PO TABS: ORAL_TABLET | ORAL | 0 refills | Status: DC

## 2020-01-04 MED ORDER — ZINC SULFATE 50 MG PO CAPS: 220.0000 mg | ORAL_CAPSULE | Freq: Every day | ORAL | 0 refills | Status: AC

## 2020-01-04 MED ORDER — GUAIFENESIN ER 600 MG PO TB12: 600.0000 mg | ORAL_TABLET | Freq: Two times a day (BID) | ORAL | 0 refills | Status: AC

## 2020-01-04 NOTE — Patient Instructions (Addendum)
Sore Throat When you have a sore throat, your throat may feel:  Tender.  Burning.  Irritated.  Scratchy.  Painful when you swallow.  Painful when you talk. Many things can cause a sore throat, such as:  An infection.  Allergies.  Dry air.  Smoke or pollution.  Radiation treatment.  Gastroesophageal reflux disease (GERD).  A tumor. A sore throat can be the first sign of another sickness. It can happen with other problems, like:  Coughing.  Sneezing.  Fever.  Swelling in the neck. Most sore throats go away without treatment. Follow these instructions at home:      Take over-the-counter medicines only as told by your doctor. ? If your child has a sore throat, do not give your child aspirin.  Drink enough fluids to keep your pee (urine) pale yellow.  Rest when you feel you need to.  To help with pain: ? Sip warm liquids, such as broth, herbal tea, or warm water. ? Eat or drink cold or frozen liquids, such as frozen ice pops. ? Gargle with a salt-water mixture 3-4 times a day or as needed. To make a salt-water mixture, add -1 tsp (3-6 g) of salt to 1 cup (237 mL) of warm water. Mix it until you cannot see the salt anymore. ? Suck on hard candy or throat lozenges. ? Put a cool-mist humidifier in your bedroom at night. ? Sit in the bathroom with the door closed for 5-10 minutes while you run hot water in the shower.  Do not use any products that contain nicotine or tobacco, such as cigarettes, e-cigarettes, and chewing tobacco. If you need help quitting, ask your doctor.  Wash your hands well and often with soap and water. If soap and water are not available, use hand sanitizer. Contact a doctor if:  You have a fever for more than 2-3 days.  You keep having symptoms for more than 2-3 days.  Your throat does not get better in 7 days.  You have a fever and your symptoms suddenly get worse.  Your child who is 3 months to 21 years old has a temperature of  102.63F (39C) or higher. Get help right away if:  You have trouble breathing.  You cannot swallow fluids, soft foods, or your saliva.  You have swelling in your throat or neck that gets worse.  You keep feeling sick to your stomach (nauseous).  You keep throwing up (vomiting). Summary  A sore throat is pain, burning, irritation, or scratchiness in the throat. Many things can cause a sore throat.  Take over-the-counter medicines only as told by your doctor. Do not give your child aspirin.  Drink plenty of fluids, and rest as needed.  Contact a doctor if your symptoms get worse or your sore throat does not get better within 7 days. This information is not intended to replace advice given to you by your health care provider. Make sure you discuss any questions you have with your health care provider. Document Revised: 07/21/2017 Document Reviewed: 07/21/2017 Elsevier Patient Education  2020 [employer]   Chronic Bronchitis, Adult Chronic bronchitis is long-lasting inflammation of the tubes that carry air into your lungs (bronchial tubes). This is inflammation that occurs:  On most days of the week.  For at least three months at a time.  Over a period of two years in a row. When the bronchial tubes are inflamed, they start to produce mucus. The inflammation and buildup of mucus make it more difficult  to breathe. Chronic bronchitis is usually a permanent problem. It is one type of chronic obstructive pulmonary disease (COPD). People with chronic bronchitis are more likely to get frequent colds or respiratory infections. What are the causes? Chronic bronchitis most often occurs in people who:  Have chronic, severe asthma.  Have a history of smoking.  Have asthma and smoke.  Have certain lung diseases.  Have had long-term exposure to certain irritating fumes or chemicals. What are the signs or symptoms? Symptoms of chronic bronchitis may include:  A cough that brings up  mucus (productive cough).  Shortness of breath.  Loud breathing (wheezing).  Chest discomfort.  Frequent (recurring) colds or respiratory infections. Certain things can trigger chronic bronchitis symptoms or make them worse, such as:  Infections.  Stopping certain medicines.  Smoking.  Exposure to chemicals. How is this diagnosed? This condition may be diagnosed based on:  Your symptoms and medical history.  A physical exam.  A chest X-ray.  Lung (pulmonary) function tests. How is this treated? There is no cure for chronic bronchitis, but treatment can help control your symptoms. Treatment may include:  Using a cool mist vaporizer or humidifier to make it easier to breathe.  Drinking more fluids. Drinking more makes your mucus thinner, which may make it easier to breathe.  Lifestyle changes, such as eating a healthier diet and getting more exercise.  Medicines, such as: ? Inhalers to improve air flow in and out of your lungs. ? Antibiotics to treat any bacterial infections you have, such as:  Lung infection (pneumonia).  Sinus infection.  A sudden, severe (acute) episode of bronchitis.  Oxygen therapy.  Preventing infections by keeping up to date on vaccinations, including the pneumonia and flu vaccines.  Pulmonary rehabilitation. This is a program that helps you manage your breathing problems and improve your quality of life. It may last for up to 4-12 weeks and may include exercise programs, education, counseling, and treatment support. Follow these instructions at home: Medicines  Take over-the-counter and prescription medicines only as told by your health care provider.  If you were prescribed an antibiotic medicine, take it as told by your health care provider. Do not stop taking the antibiotic even if you start to feel better. Preventing infections  Get vaccinations as told by your health care provider. Make sure you get a flu shot (influenza vaccine)  every year.  Wash your hands often with soap and water. If soap and water are not available, use hand sanitizer.  Avoid contact with people who have symptoms of a cold or the flu. Managing symptoms   Do not smoke, and avoid secondhand smoke. Exposure to cigarette smoke or irritating chemicals will make bronchitis worse. If you smoke and you need help quitting, ask your health care provider. Quitting smoking will help your lungs heal faster.  Use an inhaler, cool mist vaporizer, or humidifier as told by your health care provider.  Avoid pollen, dust, animal dander, molds, smoke, and other things that cause shortness of breath or wheezing attacks.  Use oxygen therapy at home as directed. Follow instructions from your health care provider about how to use oxygen safely and take precautions to prevent fire. Make sure you never smoke while using oxygen or allow others to smoke in your home.  Do not wait to get medical care if you have any concerning symptoms or trouble breathing. Waiting could cause permanent injury and may be life threatening. General instructions  Talk with your health care provider  about what activities are safe for you and about possible exercise routines. Regular exercise is very important to help you feel better.  Drink enough fluids to keep your urine pale yellow.  Keep all follow-up visits as told by your health care provider. This is important. Contact a health care provider if:  You have coughing or shortness of breath that gets worse.  You have muscle aches.  You have chest pain.  Your mucus seems to get thicker.  Your mucus changes from clear or white to yellow, green, gray, or bloody. Get help right away if:  Your usual medicines do not stop your wheezing.  You have severe difficulty breathing. These symptoms may represent a serious problem that is an emergency. Do not wait to see if the symptoms will go away. Get medical help right away. Call your  local emergency services (911 in the [place] Do not drive yourself to the hospital. Summary  Chronic bronchitis is long-lasting inflammation of the tubes that carry air into your lungs (bronchial tubes).  Chronic bronchitis is usually a permanent problem. It is one type of chronic obstructive pulmonary disease (COPD).  There is no cure for chronic bronchitis, but treatment can help control your symptoms.  Do not smoke, and avoid secondhand smoke. Exposure to cigarette smoke or irritating chemicals will make bronchitis worse. This information is not intended to replace advice given to you by your health care provider. Make sure you discuss any questions you have with your health care provider. Document Revised: 12/11/2017 Document Reviewed: 01/08/2017 Elsevier Patient Education  2020 [employer].

## 2020-01-04 NOTE — Progress Notes (Signed)
[facility]  Provider:  Kenard GowerMonina Medina-Vargas - DNP  Code Status:  Full Code  Goals of Care:  Advanced Directives 12/07/2019  Does Patient Have a Medical Advance Directive? No  Does patient want to make changes to medical advance directive? -  Would patient like information on creating a medical advance directive? No - Patient declined  Pre-existing out of facility DNR order (yellow form or pink MOST form) -     Chief Complaint  Patient presents with  . Acute Visit    Dry cough, sore throat, has not been vaccinated    HPI: Patient is a 69 y.o. male seen today for an acute visit for cough.  Lucila MaineGrandson was sick and he had him for 2 days this past weekend. He was with grandson 3 days ago. He started having dry cough and  sore throat yesterday. Denies fever, SOB nor aches. No loss of appetite. He does not want to be vaccinated. He stated that he got really sick after the flu shot 10 years ago and 5 years ago with the pneumonia vaccine. He has a PMH of Barrett's esophagus, CAD, chronic bronchitis, COVID-19 infection las 2/21 and essential hypertension.   Past Medical History:  Diagnosis Date  . Arthritis    "left thumb" (05/23/2017) right shoulder  . Barrett's esophagus    "we've been told that it's all gone; still take RX for GERD" (05/23/2017)  . Bilateral swelling of feet and ankles x 3 weeks as of 12-01-2019  . CAD (coronary artery disease), native coronary artery    Coronary angiogram 05/03/2014:  Proximal LAD 3.0x12 mm Promus premier stent.  04/08/2014: Mid Cx 3.5 x 16 mm Promus DES.  03/29/2013: Mid LAD 2.75 x 38 mm Promus Premier drug-eluting stent, balloon angioplasty of D1 and distal LAD and stenting of distal RCA with 2.75 x 24 mm promos Premier drug-eluting stent 12/15/2012 widely patent.  . Cervicalgia   . Chronic bronchitis (HCC)    "q yr" (05/23/2017)  . Complication of anesthesia    " I do not wake up very well "  . COVID-19 05/2019   all covid symptoms in hospital x 5 days,  all symptoms resolved took monoclonal antibody tx   . Family history of adverse reaction to anesthesia    "son w/PONV"  . GERD (gastroesophageal reflux disease)   . Heart murmur    "grew out of it"   . Hyperlipidemia   . Hypoglycemia, unspecified   . 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 Impotence of organic origin   . Iron deficiency anemia   . Other malaise and fatigue   . Pneumonia 05/2019  . PONV (postoperative nausea and vomiting)    after wisdom teeth pulled  . Restless leg   . Tension headache    "sometimes" (05/23/2017)  . Unspecified gastritis and gastroduodenitis with hemorrhage   . Unstable angina pectoris (HCC) 04/08/2014   Coronary angiogram 05/03/2014:  Proximal LAD 3.0x12 mm Promus premier stent.  04/08/2014: Mid Cx 3.5 x 16 mm Promus DES.  03/29/2013: Mid LAD 2.75 x 38 mm Promus Premier drug-eluting stent, balloon angioplasty of D1 and distal LAD and stenting of distal RCA with 2.75 x 24 mm promos Premier drug-eluting stent 12/15/2012 widely patent.    Past Surgical History:  Procedure Laterality Date  . BICEPT TENODESIS Right 12/07/2019   Procedure: RIGHT BICEPS TENODESIS;  Surgeon: Sheral ApleyMurphy, Timothy D, MD;  [facility];  Service: Orthopedics;  Laterality:  Right;  Marland Kitchen CARDIAC CATHETERIZATION N/A 09/25/2015   Procedure: Left Heart Cath and Coronary Angiography;  Surgeon: [name], MD;  Location: [facility] INVASIVE CV LAB;  Service: Cardiovascular;  Laterality: N/A;  . CARDIAC CATHETERIZATION N/A 09/25/2015   Procedure: Intravascular Pressure Wire/FFR Study;  Surgeon: [name], MD;  [facility] CV LAB;  Service: Cardiovascular;  Laterality: N/A;  . CORONARY ANGIOPLASTY WITH STENT PLACEMENT  12/15/2012; 04/28/2014; 05/03/2014   "2; 1; 1" , 4 stents 2 balloons  . FRACTIONAL FLOW RESERVE WIRE Right 03/26/2013   Procedure: FRACTIONAL FLOW RESERVE WIRE;  Surgeon: Pamella Pert, MD;  Location: [facility] CATH LAB;  Service: Cardiovascular;   Laterality: Right;  . FRACTIONAL FLOW RESERVE WIRE N/A 05/03/2014   Procedure: FRACTIONAL FLOW RESERVE WIRE;  Surgeon: Pamella Pert, MD;  Location: [facility] CATH LAB;  Service: Cardiovascular;  Laterality: N/A;  . HERNIA REPAIR  2010   "umbilical"   . INCISION AND DRAINAGE ABSCESS Left 05/2007   "groin"   . KNEE SURGERY Right 1992;  2000   "calcium deposits removed" (12/15/2012)  . LEFT HEART CATH AND CORONARY ANGIOGRAPHY N/A 05/23/2017   Procedure: LEFT HEART CATH AND CORONARY ANGIOGRAPHY;  Surgeon: [name], MD;  Location: [facility] INVASIVE CV LAB;  Service: Cardiovascular;  Laterality: N/A;  . LEFT HEART CATHETERIZATION WITH CORONARY ANGIOGRAM N/A 12/15/2012   Procedure: LEFT HEART CATHETERIZATION WITH CORONARY ANGIOGRAM;  Surgeon: Pamella Pert, MD;  Location: [facility] CATH LAB;  Service: Cardiovascular;  Laterality: N/A;  . LEFT HEART CATHETERIZATION WITH CORONARY ANGIOGRAM N/A 03/26/2013   Procedure: LEFT HEART CATHETERIZATION WITH CORONARY ANGIOGRAM;  Surgeon: Pamella Pert, MD;  Location: [facility] CATH LAB;  Service: Cardiovascular;  Laterality: N/A;  . LEFT HEART CATHETERIZATION WITH CORONARY ANGIOGRAM N/A 04/08/2014   Procedure: LEFT HEART CATHETERIZATION WITH CORONARY ANGIOGRAM;  Surgeon: Micheline Chapman, MD;  Location: [facility] CATH LAB;  Service: Cardiovascular;  Laterality: N/A;  . PERCUTANEOUS CORONARY STENT INTERVENTION (PCI-S) N/A 05/03/2014   Procedure: PERCUTANEOUS CORONARY STENT INTERVENTION (PCI-S);  Surgeon: Pamella Pert, MD;  Location: [facility] CATH LAB;  Service: Cardiovascular;  Laterality: N/A;  . UMBILICAL HERNIA REPAIR  yrs ago    Allergies  Allergen Reactions  . Lyrica [Pregabalin] Other (See Comments)    Made patient very lethargic the next morning, very hard to patient to function, move, etc.   . Statins Other (See Comments)    Causes Restless Legs, rhabdomyolysis  . Metformin And Related     GI upset  . Trulicity [Dulaglutide] Other (See Comments)    GI side effects      Outpatient Encounter Medications as of 01/04/2020  Medication Sig  . albuterol (PROVENTIL HFA;VENTOLIN HFA) 108 (90 Base) MCG/ACT inhaler Inhale 2 puffs into the lungs every 6 (six) hours as needed for wheezing or shortness of breath.  . Alirocumab (PRALUENT) 150 MG/ML SOAJ Inject 1 pen into the skin every 14 (fourteen) days.  Marland Kitchen aspirin 81 MG EC tablet Take 1 tablet (81 mg total) by mouth daily.  . BD PEN NEEDLE NANO 2ND GEN 32G X 4 MM MISC USE TWICE DAILY WITH LANTUS PEN  . carvedilol (COREG) 3.125 MG tablet Take 1 tablet (3.125 mg total) by mouth 2 (two) times daily with a meal.  . Continuous Blood Gluc Receiver (FREESTYLE LIBRE 14 DAY READER) DEVI Inject 1 Device as directed daily as needed. Use once daily as direct to check blood sugar E11.51  . Continuous Blood Gluc Sensor (FREESTYLE LIBRE 14 DAY SENSOR) MISC  Use to test blood sugar three times daily. E11.51. E11.59  . D-5000 125 MCG (5000 UT) TABS TAKE 1 TABLET BY MOUTH EVERY DAY  . famotidine (PEPCID) 20 MG tablet TAKE 1 TABLET BY MOUTH EVERY DAY  . fluticasone (FLONASE) 50 MCG/ACT nasal spray Place 1 spray into both nostrils daily as needed for allergies or rhinitis.  . furosemide (LASIX) 20 MG tablet TAKE 1 TABLET (20 MG TOTAL) BY MOUTH AS NEEDED. FOR SWELLING.  . gabapentin (NEURONTIN) 100 MG capsule Take 100 mg by mouth at bedtime. 200 mg qhs  . insulin aspart (NOVOLOG FLEXPEN) 100 UNIT/ML FlexPen Based on carbohydrate intake  . Insulin Glargine (BASAGLAR KWIKPEN) 100 UNIT/ML INJECT 40 UNITS SUBCUTANEOUSLY INTO THE SKIN AT BEDTIME  . LIFESCAN FINEPOINT LANCETS MISC Use to test for blood sugar three times daily dx: E11.51  . metFORMIN (GLUCOPHAGE) 1000 MG tablet TAKE 1 TABLET BY MOUTH 2 TIMES DAILY WITH A MEAL  . nitroGLYCERIN (NITROSTAT) 0.4 MG SL tablet PLACE 1 TABLET (0.4 MG TOTAL) UNDER THE TONGUE EVERY 5 (FIVE) MINUTES AS NEEDED FOR CHEST PAIN.  Marland Kitchen ONETOUCH ULTRA test strip USE TO TEST FOR BLOOD SUGAR THREE TIMES DAILY DX:  E11.51  . oxyCODONE-acetaminophen (PERCOCET) 5-325 MG tablet Take 1 tablet by mouth every 4 (four) hours as needed for severe pain.  . pantoprazole (PROTONIX) 40 MG tablet TAKE ONE TABLET BY MOUTH TWICE DAILY FOR STOMACH  . pramipexole (MIRAPEX) 1 MG tablet Take one tablet by mouth three times daily for restless leg  . valsartan (DIOVAN) 80 MG tablet Take 0.5 tablets (40 mg total) by mouth daily.   No facility-administered encounter medications on file as of 01/04/2020.    Review of Systems:  Review of Systems  Constitutional: Negative for activity change, appetite change and chills.  HENT: Positive for congestion and sore throat. Negative for ear pain.   Eyes: Negative.   Respiratory: Positive for cough and chest tightness. Negative for shortness of breath.        Little chest tightness  Cardiovascular: Positive for leg swelling. Negative for chest pain.  Gastrointestinal: Negative for constipation, diarrhea and vomiting.  Endocrine: Negative.   Genitourinary: Negative for difficulty urinating and dysuria.  Musculoskeletal: Negative for arthralgias, back pain and gait problem.  Skin: Negative.  Negative for color change.  Neurological: Negative for dizziness and light-headedness.  Psychiatric/Behavioral: Negative.     Health Maintenance  Topic Date Due  . COVID-19 Vaccine (1) Never done  . INFLUENZA VACCINE  10/03/2019  . PNA vac Low Risk Adult (2 of 2 - PPSV23) 03/14/2020 (Originally 12/02/2014)  . FOOT EXAM  03/14/2020  . HEMOGLOBIN A1C  04/19/2020  . OPHTHALMOLOGY EXAM  08/04/2020  . COLONOSCOPY  10/18/2025  . TETANUS/TDAP  03/02/2027  . Hepatitis C Screening  Completed    Physical Exam: There were no vitals filed for this visit. There is no height or weight on file to calculate BMI. Physical Exam HENT:     Head: Atraumatic.     Mouth/Throat:     Mouth: Mucous membranes are moist.     Pharynx: Oropharynx is clear.  Cardiovascular:     Rate and Rhythm: Normal rate  and regular rhythm.  Pulmonary:     Effort: Pulmonary effort is normal.     Breath sounds: Normal breath sounds.  Abdominal:     General: Bowel sounds are normal.  Musculoskeletal:        General: Normal range of motion.     Right lower leg:  Edema present.     Left lower leg: Edema present.     Comments: BLE 1+  Skin:    General: Skin is warm and dry.  Neurological:     General: No focal deficit present.     Mental Status: He is alert and oriented to person, place, and time. Mental status is at baseline.  Psychiatric:        Mood and Affect: Mood normal.        Behavior: Behavior normal.     Labs reviewed: Basic Metabolic Panel: Recent Labs    04/26/19 0530 04/27/19 0237 04/27/19 0337 04/28/19 0332 04/29/19 0134 06/14/19 0807 06/14/19 0807 10/18/19 0811 12/03/19 1338 12/07/19 0923  NA 135   < >  --  136   < > 139   < > 138 137 137  K 4.0   < >  --  4.6   < > 4.4   < > 4.7 4.2 4.5  CL 100   < >  --  102   < > 102   < > 102 101 100  CO2 28   < >  --  24   < > 29  --  29 26  --   GLUCOSE 84   < >  --  151*   < > 135*   < > 186* 203* 235*  BUN 21   < >  --  22   < > 13   < > 18 17 19   CREATININE 1.28*   < >  --  1.07   < > 0.96   < > 1.09 1.03 1.00  CALCIUM 7.9*   < >  --  8.2*   < > 9.1  --  9.1 9.1  --   MG 1.9  --  1.7 2.1  --   --   --   --   --   --   PHOS 3.2  --  2.4* 4.1  --   --   --   --   --   --    < > = values in this interval not displayed.   Liver Function Tests: Recent Labs    04/30/19 0200 04/30/19 0200 05/01/19 0344 05/01/19 0344 06/17/19 0816 10/18/19 0811 12/03/19 1338  AST 14*   < > 17   < > 17 20 34  ALT 21   < > 25   < > 15 30 47*  ALKPHOS 68  --  61  --   --   --  48  BILITOT 0.7   < > 0.5   < > 0.5 0.5 0.8  PROT 5.7*   < > 5.6*   < > 6.3 6.3 7.0  ALBUMIN 2.3*  --  2.5*  --   --   --  4.3   < > = values in this interval not displayed.   No results for input(s): LIPASE, AMYLASE in the last 8760 hours. No results for input(s):  AMMONIA in the last 8760 hours. CBC: Recent Labs    05/01/19 0344 05/04/19 1022 06/14/19 0807 06/14/19 0807 10/18/19 0811 12/03/19 1338 12/07/19 0923  WBC 7.8   < > 4.5  --  4.4 5.2  --   NEUTROABS 6.7  --  2,925  --  2,776  --   --   HGB 12.5*   < > 13.1*   < > 14.1 14.8 13.9  HCT 37.1*   < > 39.3   < >  41.3 43.4 41.0  MCV 88.8   < > 89.9  --  87.1 89.1  --   PLT 109*   < > 129*  --  118* 155  --    < > = values in this interval not displayed.   Lipid Panel: Recent Labs    03/09/19 0832 03/09/19 0832 04/14/19 0540 06/14/19 0807 06/17/19 0816 10/18/19 0811  CHOL 166  --   --  162  --  155  HDL 37*  --   --  31*  --  38*  LDLCALC 103*  --   --  106*  --  87  TRIG 159*   < > 41 142  --  199*  CHOLHDL 4.5  --   --  5.2*  --  4.1  LDLDIRECT  --   --   --   --  110*  --    < > = values in this interval not displayed.   Lab Results  Component Value Date   HGBA1C 8.8 (H) 10/18/2019     Assessment/Plan  1. Simple chronic bronchitis (HCC)  - POC Influenza A/B negative - azithromycin (ZITHROMAX Z-PAK) 250 MG tablet; Take 2 tabs =500 mg on day 1, and then take 1 tab daily X 4 days  Dispense: 6 each; Refill: 0 - instructed to notify [facility] if symptoms persist   2. Cough in adult  - POC Influenza A/B negative - SARS-COV-2 RNA,(COVID-19) QUAL NAAT - guaiFENesin (MUCINEX) 600 MG 12 hr tablet; Take 1 tablet (600 mg total) by mouth 2 (two) times daily for 7 days.  Dispense: 14 tablet; Refill: 0 - zinc sulfate 50 MG CAPS capsule; Take 1 capsule (220 mg total) by mouth daily for 7 days.  Dispense: 7 capsule; Refill: 0   3. Sore throat -  Gargle with a salt water mixture 3-4X/day (add 1/2 to 1 tsp salt to 1 cup warm water).     Labs/tests ordered:  Influenza A/B, SARS-COV-2 RNA (COVID-19)   Next appt:  02/22/2020

## 2020-01-05 LAB — SARS-COV-2 RNA,(COVID-19) QUALITATIVE NAAT: SARS CoV2 RNA: NOT DETECTED

## 2020-01-06 DIAGNOSIS — M25611 Stiffness of right shoulder, not elsewhere classified: Secondary | ICD-10-CM | POA: Diagnosis not present

## 2020-01-06 DIAGNOSIS — M25511 Pain in right shoulder: Secondary | ICD-10-CM | POA: Diagnosis not present

## 2020-01-06 DIAGNOSIS — M6281 Muscle weakness (generalized): Secondary | ICD-10-CM | POA: Diagnosis not present

## 2020-01-06 DIAGNOSIS — M75121 Complete rotator cuff tear or rupture of right shoulder, not specified as traumatic: Secondary | ICD-10-CM | POA: Diagnosis not present

## 2020-01-06 NOTE — Progress Notes (Signed)
Result showed negative. If patient still having symptoms and not getting any better, he needs to be followed up in the office.

## 2020-01-11 DIAGNOSIS — M6281 Muscle weakness (generalized): Secondary | ICD-10-CM | POA: Diagnosis not present

## 2020-01-11 DIAGNOSIS — M25611 Stiffness of right shoulder, not elsewhere classified: Secondary | ICD-10-CM | POA: Diagnosis not present

## 2020-01-11 DIAGNOSIS — M75121 Complete rotator cuff tear or rupture of right shoulder, not specified as traumatic: Secondary | ICD-10-CM | POA: Diagnosis not present

## 2020-01-11 DIAGNOSIS — M25511 Pain in right shoulder: Secondary | ICD-10-CM | POA: Diagnosis not present

## 2020-01-12 DIAGNOSIS — M25511 Pain in right shoulder: Secondary | ICD-10-CM | POA: Diagnosis not present

## 2020-01-18 DIAGNOSIS — M25611 Stiffness of right shoulder, not elsewhere classified: Secondary | ICD-10-CM | POA: Diagnosis not present

## 2020-01-18 DIAGNOSIS — M25511 Pain in right shoulder: Secondary | ICD-10-CM | POA: Diagnosis not present

## 2020-01-18 DIAGNOSIS — M6281 Muscle weakness (generalized): Secondary | ICD-10-CM | POA: Diagnosis not present

## 2020-01-18 DIAGNOSIS — M75121 Complete rotator cuff tear or rupture of right shoulder, not specified as traumatic: Secondary | ICD-10-CM | POA: Diagnosis not present

## 2020-01-19 ENCOUNTER — Telehealth: Payer: Self-pay

## 2020-01-19 NOTE — Telephone Encounter (Signed)
error 

## 2020-01-21 ENCOUNTER — Other Ambulatory Visit: Payer: Self-pay | Admitting: *Deleted

## 2020-01-21 NOTE — Patient Outreach (Signed)
[facility]) Care Management  01/21/2020  [name] 05-Sep-1950 [id number]  Unsuccessful outreach attempt made to patient. Patient answered the phone and stated that he was getting ready to go out and could this nurse call him back another day.   Plan: RN Health Coach will call patient within the month of December.  [name] RN, BSN [facility] Care Management  RN Health Coach [phone] [email or web address]

## 2020-01-31 ENCOUNTER — Other Ambulatory Visit: Payer: Self-pay | Admitting: *Deleted

## 2020-01-31 DIAGNOSIS — M25511 Pain in right shoulder: Secondary | ICD-10-CM | POA: Diagnosis not present

## 2020-01-31 DIAGNOSIS — M25611 Stiffness of right shoulder, not elsewhere classified: Secondary | ICD-10-CM | POA: Diagnosis not present

## 2020-01-31 DIAGNOSIS — M75121 Complete rotator cuff tear or rupture of right shoulder, not specified as traumatic: Secondary | ICD-10-CM | POA: Diagnosis not present

## 2020-01-31 DIAGNOSIS — M6281 Muscle weakness (generalized): Secondary | ICD-10-CM | POA: Diagnosis not present

## 2020-01-31 NOTE — Patient Instructions (Signed)
Goals Addressed            This Visit's Progress   . COMPLETED: Exercise 3x per week (30 min per time)       Pt will start walking 3 times a week   Resolved due to duplicate goal    . Increase water intake       Starting 01/10/16, I will attempt to increase my water intake and try to get around 60oz of water per day.     Resolved due to duplicate goal.    . Monitor and Manage My Blood Sugar       Follow Up Date 05/01/20    - check blood sugar at prescribed times - check blood sugar if I feel it is too high or too low - take the blood sugar meter to all doctor visits    Why is this important?    Checking your blood sugar at home helps to keep it from getting very high or very low.   Writing the results in a diary or log helps the doctor know how to care for you.   Your blood sugar log should have the time, date and the results.   Also, write down the amount of insulin or other medicine that you take.   Other information, like what you ate, exercise done and how you were feeling, will also be helpful.     Notes:     . Patient will maintian his A1c below 8 within the next 90 days   Not on track    [place] (see longtitudinal plan of care for additional care plan information)  Objective:  Lab Results  Component Value Date   HGBA1C 7.6 (H) 06/14/2019 .   Lab Results  Component Value Date   CREATININE 0.96 06/14/2019   CREATININE 1.23 05/04/2019   CREATININE 0.93 05/01/2019 .   Marland Kitchen No results found for: EGFR  Current Barriers:  Marland Kitchen Knowledge Deficits related to basic Diabetes pathophysiology and self care/management  Case Manager Clinical Goal(s):  Over the next 90 days, patient will demonstrate improved adherence to prescribed treatment plan for diabetes self care/management as evidenced by:  Marland Kitchen Verbalize daily monitoring and recording of CBG within 90 days . Verbalize adherence to ADA/ carb modified diet within the next 90 days . Verbalize exercise 2-3  days/week . Verbalize adherence to prescribed medication regimen within the next 90 days   Interventions:  . Reviewed medications with patient and discussed importance of medication adherence . Discussed plans with patient for ongoing care management follow up and provided patient with direct contact information for care management team . Encouraged patient to monitor carbohydrates closely, discussed increasing physical activity by walking routinely and patient stated he may buy a treadmill, discussed eating protein along with 4 ounces of orange juice to manage hypoglycemia  . 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 Self administers oral medications as prescribed . Self administers insulin as prescribed . Attends all scheduled provider appointments . Checks blood sugars as prescribed and utilize hyper and hypoglycemia protocol as needed . Patient stated that he know that he needs to manage his diet and start exercising again to get his A1c lower.    Please see past updates related to this goal by clicking on the "Past Updates" button in the selected goal   Updated: 01/31/20     .  Perform Foot Care       Follow Up Date 05/01/20    - check feet daily for cuts, sores or redness - trim toenails straight across - wash and dry feet carefully every day - wear comfortable, cotton socks - wear comfortable, well-fitting shoes    Why is this important?    Good foot care is very important when you have diabetes.   There are many things you can do to keep your feet healthy and catch a problem early.    Notes:

## 2020-01-31 NOTE — Patient Outreach (Signed)
[facility]) Care Management  [place]  01/31/2020   Kenneth Balan Sr. 07/05/1950 [id number]  Subjective: Successful telephone outreach call to patient. HIPAA identifiers obtained. Patient reports that his diabetes has not been well controlled. Patient stated that his A1c has increased to 8.8 and his blood sugar has been pretty consistently in the 200 ranged. Patient explained that he knows  he needs to eat better, lose weight, and start to exercise. 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 He plans to start walking routinely and states he may buy a treadmill. Nurse discussed him trying to stay on schedule as much as possible with meals and taking his insulins. Nurse encouraged the patient to bring a cooler with diabetic friendly foods with him on the road and also suggested that he drink Glucerna or a high protein, low carbohydrate supplement on the road to supplement his diet if he feels that he does not have time to stop and eat. Patient also explained that his blood sugar will drop around 2-4 AM and nurse discussed drinking Glucerna  as a nighttime snack would help to prevent him from becoming hypoglycemic. Patient explained that he did have shoulder surgery which went well and reports that he is now going to work towards getting his diabetes in better control. Patient did not have any further concerns today.  Encounter Medications:  Outpatient Encounter Medications as of 01/31/2020  Medication Sig Note  . albuterol (PROVENTIL HFA;VENTOLIN HFA) 108 (90 Base) MCG/ACT inhaler Inhale 2 puffs into the lungs every 6 (six) hours as needed for wheezing or shortness of breath.   . Alirocumab (PRALUENT) 150 MG/ML SOAJ Inject 1 pen into the skin every 14 (fourteen) days.   Marland Kitchen aspirin 81 MG EC tablet Take 1 tablet (81 mg total) by mouth daily.   . BD PEN NEEDLE NANO 2ND GEN 32G X 4 MM MISC  USE TWICE DAILY WITH LANTUS PEN   . carvedilol (COREG) 3.125 MG tablet Take 1 tablet (3.125 mg total) by mouth 2 (two) times daily with a meal.   . Continuous Blood Gluc Receiver (FREESTYLE LIBRE 14 DAY READER) DEVI Inject 1 Device as directed daily as needed. Use once daily as direct to check blood sugar E11.51   . Continuous Blood Gluc Sensor (FREESTYLE LIBRE 14 DAY SENSOR) MISC Use to test blood sugar three times daily. E11.51. E11.59   . D-5000 125 MCG (5000 UT) TABS TAKE 1 TABLET BY MOUTH EVERY DAY   . famotidine (PEPCID) 20 MG tablet TAKE 1 TABLET BY MOUTH EVERY DAY   . fluticasone (FLONASE) 50 MCG/ACT nasal spray Place 1 spray into both nostrils daily as needed for allergies or rhinitis.   . furosemide (LASIX) 20 MG tablet TAKE 1 TABLET (20 MG TOTAL) BY MOUTH AS NEEDED. FOR SWELLING.   . gabapentin (NEURONTIN) 100 MG capsule Take 100 mg by mouth at bedtime. Takes 100-200 as he feels he needs   . insulin aspart (NOVOLOG FLEXPEN) 100 UNIT/ML FlexPen Based on carbohydrate intake   . Insulin Glargine (BASAGLAR KWIKPEN) 100 UNIT/ML INJECT 40 UNITS SUBCUTANEOUSLY INTO THE SKIN AT BEDTIME   . LIFESCAN FINEPOINT LANCETS MISC Use to test for blood sugar three times daily dx: E11.51   . metFORMIN (GLUCOPHAGE) 1000 MG tablet TAKE 1 TABLET BY MOUTH 2 TIMES DAILY WITH A MEAL   . nitroGLYCERIN (NITROSTAT) 0.4 MG SL tablet  PLACE 1 TABLET (0.4 MG TOTAL) UNDER THE TONGUE EVERY 5 (FIVE) MINUTES AS NEEDED FOR CHEST PAIN.   Marland Kitchen ONETOUCH ULTRA test strip USE TO TEST FOR BLOOD SUGAR THREE TIMES DAILY DX: E11.51   . pantoprazole (PROTONIX) 40 MG tablet TAKE ONE TABLET BY MOUTH TWICE DAILY FOR STOMACH   . pramipexole (MIRAPEX) 1 MG tablet Take 1 mg by mouth in the morning and at bedtime. QAM and QHS   . azithromycin (ZITHROMAX Z-PAK) 250 MG tablet Take 2 tabs =500 mg on day 1, and then take 1 tab daily X 4 days (Patient not taking: Reported on 01/31/2020) 01/31/2020: completed   No facility-administered encounter  medications on file as of 01/31/2020.    Functional Status:  In your present state of health, do you have any difficulty performing the following activities: 12/07/2019 09/24/2019  Hearing? N N  Vision? N N  Difficulty concentrating or making decisions? N N  Walking or climbing stairs? N N  Dressing or bathing? N N  Doing errands, shopping? - N  Comment - Facilities manager and eating ? - N  Using the Toilet? - N  In the past six months, have you accidently leaked urine? - N  Do you have problems with loss of bowel control? - N  Managing your Medications? - N  Managing your Finances? - N  Housekeeping or managing your Housekeeping? - N  Some recent data might be hidden    Fall/Depression Screening: Fall Risk  01/31/2020 10/28/2019 09/24/2019  Falls in the past year? 1 0 0  Number falls in past yr: 0 0 0  Comment - - -  Injury with Fall? 0 0 0  Comment - - -  Risk for fall due to : - - -  Risk for fall due to: Comment - - -  Follow up Falls evaluation completed - Falls prevention discussed;Falls evaluation completed;Education provided   [facility] 2/9 Scores 10/28/2019 09/24/2019 06/17/2019 05/31/2019 05/17/2019 05/10/2019 05/05/2019  PHQ - 2 Score 0 0 0 0 0 0 0    Assessment:  Goals Addressed            This Visit's Progress   . COMPLETED: Exercise 3x per week (30 min per time)       Pt will start walking 3 times a week   Resolved due to duplicate goal    . Increase water intake       Starting 01/10/16, I will attempt to increase my water intake and try to get around 60oz of water per day.     Resolved due to duplicate goal.    . Monitor and Manage My Blood Sugar       Follow Up Date 05/01/20    - check blood sugar at prescribed times - check blood sugar if I feel it is too high or too low - take the blood sugar meter to all doctor visits    Why is this important?    Checking your blood sugar at home helps to keep it from getting very high or very low.   Writing the  results in a diary or log helps the doctor know how to care for you.   Your blood sugar log should have the time, date and the results.   Also, write down the amount of insulin or other medicine that you take.   Other information, like what you ate, exercise done and how you were feeling, will also be helpful.  Notes:     . Patient will maintian his A1c below 8 within the next 90 days   Not on track    CARE PLAN ENTRY (see longtitudinal plan of care for additional care plan information)  Objective:  Lab Results  Component Value Date   HGBA1C 7.6 (H) 06/14/2019 .   Lab Results  Component Value Date   CREATININE 0.96 06/14/2019   CREATININE 1.23 05/04/2019   CREATININE 0.93 05/01/2019 .   . No results found for: EGFR  Current Barriers:  . Knowledge Deficits related to basic Diabetes pathophysiology and self care/management  Case Manager Clinical Goal(s):  Over the next 90 days, patient will demonstrate improved adherence to prescribed treatment plan for diabetes self care/management as evidenced by:  . Verbalize daily monitoring and recording of CBG within 90 days . Verbalize adherence to ADA/ carb modified diet within the next 90 days . Verbalize exercise 2-3 days/week . Verbalize adherence to prescribed medication regimen within the next 90 days   Interventions:  . Reviewed medications with patient and discussed importance of medication adherence . Discussed plans with patient for ongoing care management follow up and provided patient with direct contact information for care management team . Encouraged patient to monitor carbohydrates closely, discussed increasing physical activity by walking routinely and patient stated he may buy a treadmill, discussed eating protein along with 4 ounces of orange juice to manage hypoglycemia  . 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 Self administers oral medications as prescribed . Self administers insulin as prescribed . Attends all scheduled provider appointments . Checks blood sugars as prescribed and utilize hyper and hypoglycemia protocol as needed . Patient stated that he know that he needs to manage his diet and start exercising again to get his A1c lower.    Please see past updates related to this goal by clicking on the "Past Updates" button in the selected goal   Updated: 01/31/20     . Perform Foot Care       Follow Up Date 05/01/20    - check feet daily for cuts, sores or redness - trim toenails straight across - wash and dry feet carefully every day - wear comfortable, cotton socks - wear comfortable, well-fitting shoes    Why is this important?    Good foot care is very important when you have diabetes.   There are many things you can do to keep your feet healthy and catch a problem early.    Notes:       Plan: RN Health Coach will send PCP today's assessment note, will send patient Glucerna coupons and diabetic diet education, will call patient within the month of February, and patient agrees to future outreach calls.   Jill Wine RN, BSN [facility] Care Management  RN Health Coach [phone] [email or web address]     

## 2020-02-01 DIAGNOSIS — M25611 Stiffness of right shoulder, not elsewhere classified: Secondary | ICD-10-CM | POA: Diagnosis not present

## 2020-02-01 DIAGNOSIS — M6281 Muscle weakness (generalized): Secondary | ICD-10-CM | POA: Diagnosis not present

## 2020-02-01 DIAGNOSIS — M25511 Pain in right shoulder: Secondary | ICD-10-CM | POA: Diagnosis not present

## 2020-02-01 DIAGNOSIS — M75121 Complete rotator cuff tear or rupture of right shoulder, not specified as traumatic: Secondary | ICD-10-CM | POA: Diagnosis not present

## 2020-02-02 ENCOUNTER — Other Ambulatory Visit: Payer: Self-pay | Admitting: Internal Medicine

## 2020-02-02 DIAGNOSIS — Z20822 Contact with and (suspected) exposure to covid-19: Secondary | ICD-10-CM

## 2020-02-03 DIAGNOSIS — M6281 Muscle weakness (generalized): Secondary | ICD-10-CM | POA: Diagnosis not present

## 2020-02-03 DIAGNOSIS — M25511 Pain in right shoulder: Secondary | ICD-10-CM | POA: Diagnosis not present

## 2020-02-03 DIAGNOSIS — M25611 Stiffness of right shoulder, not elsewhere classified: Secondary | ICD-10-CM | POA: Diagnosis not present

## 2020-02-03 DIAGNOSIS — M75121 Complete rotator cuff tear or rupture of right shoulder, not specified as traumatic: Secondary | ICD-10-CM | POA: Diagnosis not present

## 2020-02-03 NOTE — Telephone Encounter (Signed)
Last visit 01/04/2020, last refill for Vitamin D  D 08/2019

## 2020-02-07 ENCOUNTER — Other Ambulatory Visit: Payer: Self-pay | Admitting: *Deleted

## 2020-02-07 MED ORDER — GABAPENTIN 100 MG PO CAPS
ORAL_CAPSULE | ORAL | 3 refills | Status: DC
Start: 2020-02-07 — End: 2020-07-14

## 2020-02-07 NOTE — Telephone Encounter (Signed)
Patient called and stated that a Neurologist in [place] prescribed his Gabapentin but he no longer see's them and wants to know if you will refill.  Confirmed dosage and pended Rx and sent to Dr. Renato Gails for approval.

## 2020-02-08 DIAGNOSIS — M25611 Stiffness of right shoulder, not elsewhere classified: Secondary | ICD-10-CM | POA: Diagnosis not present

## 2020-02-08 DIAGNOSIS — M6281 Muscle weakness (generalized): Secondary | ICD-10-CM | POA: Diagnosis not present

## 2020-02-08 DIAGNOSIS — M25511 Pain in right shoulder: Secondary | ICD-10-CM | POA: Diagnosis not present

## 2020-02-08 DIAGNOSIS — M75121 Complete rotator cuff tear or rupture of right shoulder, not specified as traumatic: Secondary | ICD-10-CM | POA: Diagnosis not present

## 2020-02-10 DIAGNOSIS — M6281 Muscle weakness (generalized): Secondary | ICD-10-CM | POA: Diagnosis not present

## 2020-02-10 DIAGNOSIS — M75121 Complete rotator cuff tear or rupture of right shoulder, not specified as traumatic: Secondary | ICD-10-CM | POA: Diagnosis not present

## 2020-02-10 DIAGNOSIS — M25611 Stiffness of right shoulder, not elsewhere classified: Secondary | ICD-10-CM | POA: Diagnosis not present

## 2020-02-10 DIAGNOSIS — M25511 Pain in right shoulder: Secondary | ICD-10-CM | POA: Diagnosis not present

## 2020-02-15 DIAGNOSIS — M75121 Complete rotator cuff tear or rupture of right shoulder, not specified as traumatic: Secondary | ICD-10-CM | POA: Diagnosis not present

## 2020-02-15 DIAGNOSIS — M25511 Pain in right shoulder: Secondary | ICD-10-CM | POA: Diagnosis not present

## 2020-02-15 DIAGNOSIS — M25611 Stiffness of right shoulder, not elsewhere classified: Secondary | ICD-10-CM | POA: Diagnosis not present

## 2020-02-15 DIAGNOSIS — M6281 Muscle weakness (generalized): Secondary | ICD-10-CM | POA: Diagnosis not present

## 2020-02-16 ENCOUNTER — Other Ambulatory Visit: Payer: Self-pay | Admitting: Cardiology

## 2020-02-16 DIAGNOSIS — M25511 Pain in right shoulder: Secondary | ICD-10-CM | POA: Diagnosis not present

## 2020-02-17 DIAGNOSIS — M6281 Muscle weakness (generalized): Secondary | ICD-10-CM | POA: Diagnosis not present

## 2020-02-17 DIAGNOSIS — M25611 Stiffness of right shoulder, not elsewhere classified: Secondary | ICD-10-CM | POA: Diagnosis not present

## 2020-02-17 DIAGNOSIS — M25511 Pain in right shoulder: Secondary | ICD-10-CM | POA: Diagnosis not present

## 2020-02-17 DIAGNOSIS — M75121 Complete rotator cuff tear or rupture of right shoulder, not specified as traumatic: Secondary | ICD-10-CM | POA: Diagnosis not present

## 2020-02-22 ENCOUNTER — Other Ambulatory Visit: Payer: Medicare Other

## 2020-02-22 ENCOUNTER — Other Ambulatory Visit: Payer: Self-pay

## 2020-02-22 DIAGNOSIS — M75121 Complete rotator cuff tear or rupture of right shoulder, not specified as traumatic: Secondary | ICD-10-CM | POA: Diagnosis not present

## 2020-02-22 DIAGNOSIS — M25611 Stiffness of right shoulder, not elsewhere classified: Secondary | ICD-10-CM | POA: Diagnosis not present

## 2020-02-22 DIAGNOSIS — M25511 Pain in right shoulder: Secondary | ICD-10-CM | POA: Diagnosis not present

## 2020-02-22 DIAGNOSIS — IMO0002 Reserved for concepts with insufficient information to code with codable children: Secondary | ICD-10-CM

## 2020-02-22 DIAGNOSIS — E1165 Type 2 diabetes mellitus with hyperglycemia: Secondary | ICD-10-CM | POA: Diagnosis not present

## 2020-02-22 DIAGNOSIS — E1159 Type 2 diabetes mellitus with other circulatory complications: Secondary | ICD-10-CM | POA: Diagnosis not present

## 2020-02-22 DIAGNOSIS — M6281 Muscle weakness (generalized): Secondary | ICD-10-CM | POA: Diagnosis not present

## 2020-02-22 DIAGNOSIS — Z794 Long term (current) use of insulin: Secondary | ICD-10-CM | POA: Diagnosis not present

## 2020-02-23 LAB — CBC WITH DIFFERENTIAL/PLATELET
Absolute Monocytes: 440 cells/uL (ref 200–950)
Basophils Absolute: 20 cells/uL (ref 0–200)
Basophils Relative: 0.4 %
Eosinophils Absolute: 130 cells/uL (ref 15–500)
Eosinophils Relative: 2.6 %
HCT: 42.4 % (ref 38.5–50.0)
Hemoglobin: 14.5 g/dL (ref 13.2–17.1)
Lymphs Abs: 1265 cells/uL (ref 850–3900)
MCH: 29.4 pg (ref 27.0–33.0)
MCHC: 34.2 g/dL (ref 32.0–36.0)
MCV: 86 fL (ref 80.0–100.0)
MPV: 11.7 fL (ref 7.5–12.5)
Monocytes Relative: 8.8 %
Neutro Abs: 3145 cells/uL (ref 1500–7800)
Neutrophils Relative %: 62.9 %
Platelets: 143 10*3/uL (ref 140–400)
RBC: 4.93 10*6/uL (ref 4.20–5.80)
RDW: 12.4 % (ref 11.0–15.0)
Total Lymphocyte: 25.3 %
WBC: 5 10*3/uL (ref 3.8–10.8)

## 2020-02-23 LAB — BASIC METABOLIC PANEL WITH GFR
BUN: 19 mg/dL (ref 7–25)
CO2: 28 mmol/L (ref 20–32)
Calcium: 9.4 mg/dL (ref 8.6–10.3)
Chloride: 101 mmol/L (ref 98–110)
Creat: 1.07 mg/dL (ref 0.70–1.18)
GFR, Est African American: 81 mL/min/{1.73_m2} (ref >=60)
GFR, Est Non African American: 70 mL/min/{1.73_m2} (ref >=60)
Glucose, Bld: 208 mg/dL — ABNORMAL HIGH (ref 65–99)
Potassium: 4.8 mmol/L (ref 3.5–5.3)
Sodium: 135 mmol/L (ref 135–146)

## 2020-02-23 LAB — HEMOGLOBIN A1C
Hgb A1c MFr Bld: 8.6 % of total Hgb — ABNORMAL HIGH (ref <5.7)
Mean Plasma Glucose: 200 mg/dL
eAG (mmol/L): 11.1 mmol/L

## 2020-02-23 NOTE — Progress Notes (Signed)
Slight improvement in sugar average since last time.  We'll discuss at his visit.

## 2020-02-24 ENCOUNTER — Other Ambulatory Visit: Payer: Self-pay | Admitting: Internal Medicine

## 2020-02-24 DIAGNOSIS — M75121 Complete rotator cuff tear or rupture of right shoulder, not specified as traumatic: Secondary | ICD-10-CM | POA: Diagnosis not present

## 2020-02-24 DIAGNOSIS — M25611 Stiffness of right shoulder, not elsewhere classified: Secondary | ICD-10-CM | POA: Diagnosis not present

## 2020-02-24 DIAGNOSIS — M6281 Muscle weakness (generalized): Secondary | ICD-10-CM | POA: Diagnosis not present

## 2020-02-24 DIAGNOSIS — IMO0002 Reserved for concepts with insufficient information to code with codable children: Secondary | ICD-10-CM

## 2020-02-24 NOTE — Telephone Encounter (Signed)
Pharmacy is requesting changes. Medication pend and sent to PCP Renato Gails, Tiffany L, DO . Please Advise.

## 2020-02-28 ENCOUNTER — Ambulatory Visit: Payer: Medicare Other | Admitting: Internal Medicine

## 2020-03-02 ENCOUNTER — Encounter: Payer: Self-pay | Admitting: Nurse Practitioner

## 2020-03-02 ENCOUNTER — Other Ambulatory Visit: Payer: Self-pay

## 2020-03-02 ENCOUNTER — Ambulatory Visit (INDEPENDENT_AMBULATORY_CARE_PROVIDER_SITE_OTHER): Payer: Medicare Other | Admitting: Nurse Practitioner

## 2020-03-02 DIAGNOSIS — Z0000 Encounter for general adult medical examination without abnormal findings: Secondary | ICD-10-CM

## 2020-03-02 NOTE — Patient Instructions (Signed)
[name] , Thank you for taking time to come for your Medicare Wellness Visit. I appreciate your ongoing commitment to your health goals. Please review the following plan we discussed and let me know if I can assist you in the future.   Screening recommendations/referrals: Colonoscopy up tod ate Recommended yearly ophthalmology/optometry visit for glaucoma screening and checkup Recommended yearly dental visit for hygiene and checkup  Vaccinations: Influenza vaccine declines due to side effects  Pneumococcal vaccine declines due to side effects Tdap vaccine up todate Shingles vaccine RECOMMENDED, to get at local pharmacy if you decide.     Advanced directives: recommend to complete and bring back to place on file. MOST form as well   Conditions/risks identified: heart disease, complications related to diabetes, advanced age.   Next appointment: 1 year   Preventive Care 63 Years and Older, Male Preventive care refers to lifestyle choices and visits with your health care provider that can promote health and wellness. What does preventive care include?  A yearly physical exam. This is also called an annual well check.  Dental exams once or twice a year.  Routine eye exams. Ask your health care provider how often you should have your eyes checked.  Personal lifestyle choices, including:  Daily care of your teeth and gums.  Regular physical activity.  Eating a healthy diet.  Avoiding tobacco and drug use.  Limiting alcohol use.  Practicing safe sex.  Taking low doses of aspirin every day.  Taking vitamin and mineral supplements as recommended by your health care provider. What happens during an annual well check? The services and screenings done by your health care provider during your annual well check will depend on your age, overall health, lifestyle risk factors, and family history of disease. Counseling  Your health care provider may ask you questions about  your:  Alcohol use.  Tobacco use.  Drug use.  Emotional well-being.  Home and relationship well-being.  Sexual activity.  Eating habits.  History of falls.  Memory and ability to understand (cognition).  Work and work Astronomer. Screening  You may have the following tests or measurements:  Height, weight, and BMI.  Blood pressure.  Lipid and cholesterol levels. These may be checked every 5 years, or more frequently if you are over 23 years old.  Skin check.  Lung cancer screening. You may have this screening every year starting at age 21 if you have a 30-pack-year history of smoking and currently smoke or have quit within the past 15 years.  Fecal occult blood test (FOBT) of the stool. You may have this test every year starting at age 40.  Flexible sigmoidoscopy or colonoscopy. You may have a sigmoidoscopy every 5 years or a colonoscopy every 10 years starting at age 42.  Prostate cancer screening. Recommendations will vary depending on your family history and other risks.  Hepatitis C blood test.  Hepatitis B blood test.  Sexually transmitted disease (STD) testing.  Diabetes screening. This is done by checking your blood sugar (glucose) after you have not eaten for a while (fasting). You may have this done every 1-3 years.  Abdominal aortic aneurysm (AAA) screening. You may need this if you are a current or former smoker.  Osteoporosis. You may be screened starting at age 7 if you are at high risk. Talk with your health care provider about your test results, treatment options, and if necessary, the need for more tests. Vaccines  Your health care provider may recommend certain vaccines, such  as:  Influenza vaccine. This is recommended every year.  Tetanus, diphtheria, and acellular pertussis (Tdap, Td) vaccine. You may need a Td booster every 10 years.  Zoster vaccine. You may need this after age 58.  Pneumococcal 13-valent conjugate (PCV13) vaccine.  One dose is recommended after age 48.  Pneumococcal polysaccharide (PPSV23) vaccine. One dose is recommended after age 52. Talk to your health care provider about which screenings and vaccines you need and how often you need them. This information is not intended to replace advice given to you by your health care provider. Make sure you discuss any questions you have with your health care provider. Document Released: 03/17/2015 Document Revised: 11/08/2015 Document Reviewed: 12/20/2014 Elsevier Interactive Patient Education  2017 [place] Prevention in the Home Falls can cause injuries. They can happen to people of all ages. There are many things you can do to make your home safe and to help prevent falls. What can I do on the outside of my home?  Regularly fix the edges of walkways and driveways and fix any cracks.  Remove anything that might make you trip as you walk through a door, such as a raised step or threshold.  Trim any bushes or trees on the path to your home.  Use bright outdoor lighting.  Clear any walking paths of anything that might make someone trip, such as rocks or tools.  Regularly check to see if handrails are loose or broken. Make sure that both sides of any steps have handrails.  Any raised decks and porches should have guardrails on the edges.  Have any leaves, snow, or ice cleared regularly.  Use sand or salt on walking paths during winter.  Clean up any spills in your garage right away. This includes oil or grease spills. What can I do in the bathroom?  Use night lights.  Install grab bars by the toilet and in the tub and shower. Do not use towel bars as grab bars.  Use non-skid mats or decals in the tub or shower.  If you need to sit down in the shower, use a plastic, non-slip stool.  Keep the floor dry. Clean up any water that spills on the floor as soon as it happens.  Remove soap buildup in the tub or shower regularly.  Attach bath  mats securely with double-sided non-slip rug tape.  Do not have throw rugs and other things on the floor that can make you trip. What can I do in the bedroom?  Use night lights.  Make sure that you have a light by your bed that is easy to reach.  Do not use any sheets or blankets that are too big for your bed. They should not hang down onto the floor.  Have a firm chair that has side arms. You can use this for support while you get dressed.  Do not have throw rugs and other things on the floor that can make you trip. What can I do in the kitchen?  Clean up any spills right away.  Avoid walking on wet floors.  Keep items that you use a lot in easy-to-reach places.  If you need to reach something above you, use a strong step stool that has a grab bar.  Keep electrical cords out of the way.  Do not use floor polish or wax that makes floors slippery. If you must use wax, use non-skid floor wax.  Do not have throw rugs and other things on the  floor that can make you trip. What can I do with my stairs?  Do not leave any items on the stairs.  Make sure that there are handrails on both sides of the stairs and use them. Fix handrails that are broken or loose. Make sure that handrails are as long as the stairways.  Check any carpeting to make sure that it is firmly attached to the stairs. Fix any carpet that is loose or worn.  Avoid having throw rugs at the top or bottom of the stairs. If you do have throw rugs, attach them to the floor with carpet tape.  Make sure that you have a light switch at the top of the stairs and the bottom of the stairs. If you do not have them, ask someone to add them for you. What else can I do to help prevent falls?  Wear shoes that:  Do not have high heels.  Have rubber bottoms.  Are comfortable and fit you well.  Are closed at the toe. Do not wear sandals.  If you use a stepladder:  Make sure that it is fully opened. Do not climb a closed  stepladder.  Make sure that both sides of the stepladder are locked into place.  Ask someone to hold it for you, if possible.  Clearly mark and make sure that you can see:  Any grab bars or handrails.  First and last steps.  Where the edge of each step is.  Use tools that help you move around (mobility aids) if they are needed. These include:  Canes.  Walkers.  Scooters.  Crutches.  Turn on the lights when you go into a dark area. Replace any light bulbs as soon as they burn out.  Set up your furniture so you have a clear path. Avoid moving your furniture around.  If any of your floors are uneven, fix them.  If there are any pets around you, be aware of where they are.  Review your medicines with your doctor. Some medicines can make you feel dizzy. This can increase your chance of falling. Ask your doctor what other things that you can do to help prevent falls. This information is not intended to replace advice given to you by your health care provider. Make sure you discuss any questions you have with your health care provider. Document Released: 12/15/2008 Document Revised: 07/27/2015 Document Reviewed: 03/25/2014 Elsevier Interactive Patient Education  2017 [employer].

## 2020-03-02 NOTE — Progress Notes (Signed)
This service is provided via telemedicine  No vital signs collected/recorded due to the encounter was a telemedicine visit.   Location of patient (ex: home, work):  Home  Patient consents to a telephone visit: Yes, see encounter dated 03/02/2020  Location of the provider (ex: office, home):  [facility]  Name of any referring provider:  Bufford Spikes, DO  Names of all persons participating in the telemedicine service and their role in the encounter:  Abbey Chatters, Nurse Practitioner, Elveria Royals, CMA, and patient.   Time spent on call: 9 minutes with medical assistant

## 2020-03-02 NOTE — Progress Notes (Signed)
 Subjective:   Kenneth Gonder Sr. is a 69 y.o. male who presents for Medicare Annual/Subsequent preventive examination.  Review of Systems     Cardiac Risk Factors include: male gender;diabetes mellitus;hypertension;dyslipidemia;advanced age (>55men, >65 women)     Objective:    There were no vitals filed for this visit. There is no height or weight on file to calculate BMI.  Advanced Directives 03/02/2020 01/04/2020 12/07/2019 10/28/2019 09/24/2019 06/17/2019 05/31/2019  Does Patient Have a Medical Advance Directive? Yes No No No No - No  Does patient want to make changes to medical advance directive? Yes (MAU/Ambulatory/Procedural Areas - Information given) - - - - No - Patient declined No - Guardian declined  Would patient like information on creating a medical advance directive? - No - Patient declined No - Patient declined No - Patient declined No - Patient declined - -  Pre-existing out of facility DNR order (yellow form or pink MOST form) - - - - - - -    Current Medications (verified) Outpatient Encounter Medications as of 03/02/2020  Medication Sig  . albuterol (PROVENTIL HFA;VENTOLIN HFA) 108 (90 Base) MCG/ACT inhaler Inhale 2 puffs into the lungs every 6 (six) hours as needed for wheezing or shortness of breath.  . Alirocumab (PRALUENT) 150 MG/ML SOAJ Inject 1 pen into the skin every 14 (fourteen) days.  . aspirin 81 MG EC tablet Take 1 tablet (81 mg total) by mouth daily.  . BD PEN NEEDLE NANO 2ND GEN 32G X 4 MM MISC USE TWICE DAILY WITH LANTUS PEN  . carvedilol (COREG) 3.125 MG tablet Take 1 tablet (3.125 mg total) by mouth 2 (two) times daily with a meal.  . Continuous Blood Gluc Receiver (FREESTYLE LIBRE 14 DAY READER) DEVI Inject 1 Device as directed daily as needed. Use once daily as direct to check blood sugar E11.51  . Continuous Blood Gluc Sensor (FREESTYLE LIBRE 14 DAY SENSOR) MISC Use to test blood sugar three times daily. E11.51. E11.59  . D-5000 125 MCG (5000 UT) TABS  TAKE 1 TABLET BY MOUTH EVERY DAY  . famotidine (PEPCID) 20 MG tablet TAKE 1 TABLET BY MOUTH EVERY DAY  . fluticasone (FLONASE) 50 MCG/ACT nasal spray Place 1 spray into both nostrils daily as needed for allergies or rhinitis.  . gabapentin (NEURONTIN) 100 MG capsule Take Three capsules by mouth once daily at bedtime.  . Insulin Glargine (BASAGLAR KWIKPEN) 100 UNIT/ML INJECT 40 UNITS SUBCUTANEOUSLY INTO THE SKIN AT BEDTIME  . LIFESCAN FINEPOINT LANCETS MISC Use to test for blood sugar three times daily dx: E11.51  . metFORMIN (GLUCOPHAGE) 1000 MG tablet TAKE 1 TABLET BY MOUTH 2 TIMES DAILY WITH A MEAL  . nitroGLYCERIN (NITROSTAT) 0.4 MG SL tablet PLACE 1 TABLET (0.4 MG TOTAL) UNDER THE TONGUE EVERY 5 (FIVE) MINUTES AS NEEDED FOR CHEST PAIN.  . NOVOLOG FLEXPEN 100 UNIT/ML FlexPen BASED ON CARBOHYDRATE INTAKE  . ONETOUCH ULTRA test strip USE TO TEST FOR BLOOD SUGAR THREE TIMES DAILY DX: E11.51  . pantoprazole (PROTONIX) 40 MG tablet TAKE ONE TABLET BY MOUTH TWICE DAILY FOR STOMACH  . pramipexole (MIRAPEX) 1 MG tablet Take 1 mg by mouth in the morning and at bedtime. QAM and QHS  . [DISCONTINUED] azithromycin (ZITHROMAX Z-PAK) 250 MG tablet Take 2 tabs =500 mg on day 1, and then take 1 tab daily X 4 days (Patient not taking: Reported on 01/31/2020)  . [DISCONTINUED] furosemide (LASIX) 20 MG tablet TAKE 1 TABLET (20 MG TOTAL) BY MOUTH AS NEEDED. FOR   SWELLING.   No facility-administered encounter medications on file as of 03/02/2020.    Allergies (verified) Lyrica [pregabalin], Statins, and Trulicity [dulaglutide]   History: Past Medical History:  Diagnosis Date  . Arthritis    "left thumb" (05/23/2017) right shoulder  . Barrett's esophagus    "we've been told that it's all gone; still take RX for GERD" (05/23/2017)  . Bilateral swelling of feet and ankles x 3 weeks as of 12-01-2019  . CAD (coronary artery disease), native coronary artery    Coronary angiogram 05/03/2014:  Proximal LAD 3.0x12 mm  Promus premier stent.  04/08/2014: Mid Cx 3.5 x 16 mm Promus DES.  03/29/2013: Mid LAD 2.75 x 38 mm Promus Premier drug-eluting stent, balloon angioplasty of D1 and distal LAD and stenting of distal RCA with 2.75 x 24 mm promos Premier drug-eluting stent 12/15/2012 widely patent.  . Cervicalgia   . Chronic bronchitis (HCC)    "q yr" (05/23/2017)  . Complication of anesthesia    " I do not wake up very well "  . COVID-19 05/2019   all covid symptoms in hospital x 5 days, all symptoms resolved took monoclonal antibody tx   . Family history of adverse reaction to anesthesia    "son w/PONV"  . GERD (gastroesophageal reflux disease)   . Heart murmur    "grew out of it"   . Hyperlipidemia   . Hypoglycemia, unspecified   . 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 Impotence of organic origin   . Iron deficiency anemia   . Other malaise and fatigue   . Pneumonia 05/2019  . PONV (postoperative nausea and vomiting)    after wisdom teeth pulled  . Restless leg   . Rotator cuff arthropathy   . Tension headache    "sometimes" (05/23/2017)  . Unspecified gastritis and gastroduodenitis with hemorrhage   . Unstable angina pectoris (HCC) 04/08/2014   Coronary angiogram 05/03/2014:  Proximal LAD 3.0x12 mm Promus premier stent.  04/08/2014: Mid Cx 3.5 x 16 mm Promus DES.  03/29/2013: Mid LAD 2.75 x 38 mm Promus Premier drug-eluting stent, balloon angioplasty of D1 and distal LAD and stenting of distal RCA with 2.75 x 24 mm promos Premier drug-eluting stent 12/15/2012 widely patent.   Past Surgical History:  Procedure Laterality Date  . BICEPT TENODESIS Right 12/07/2019   Procedure: RIGHT BICEPS TENODESIS;  Surgeon: Murphy, Timothy D, MD;  Location: [facility];  Service: Orthopedics;  Laterality: Right;  . CARDIAC CATHETERIZATION N/A 09/25/2015   Procedure: Left Heart Cath and Coronary Angiography;  Surgeon: Jay Ganji, MD;  Location: [facility];  Service: Cardiovascular;   Laterality: N/A;  . CARDIAC CATHETERIZATION N/A 09/25/2015   Procedure: Intravascular Pressure Wire/FFR Study;  Surgeon: Jay Ganji, MD;  Location: [facility];  Service: Cardiovascular;  Laterality: N/A;  . CORONARY ANGIOPLASTY WITH STENT PLACEMENT  12/15/2012; 04/28/2014; 05/03/2014   "2; 1; 1" , 4 stents 2 balloons  . FRACTIONAL FLOW RESERVE WIRE Right 03/26/2013   Procedure: FRACTIONAL FLOW RESERVE WIRE;  Surgeon: Jagadeesh R Ganji, MD;  Location: [facility];  Service: Cardiovascular;  Laterality: Right;  . FRACTIONAL FLOW RESERVE WIRE N/A 05/03/2014   Procedure: FRACTIONAL FLOW RESERVE WIRE;  Surgeon: Jagadeesh R Ganji, MD;  Location: [facility];  Service: Cardiovascular;  Laterality: N/A;  . HERNIA REPAIR  2010   "umbilical"   . INCISION AND DRAINAGE ABSCESS Left 05/2007   "groin"   . KNEE   SURGERY Right 1992;  2000   "calcium deposits removed" (12/15/2012)  . LEFT HEART CATH AND CORONARY ANGIOGRAPHY N/A 05/23/2017   Procedure: LEFT HEART CATH AND CORONARY ANGIOGRAPHY;  Surgeon: Ganji, Jay, MD;  Location: [facility];  Service: Cardiovascular;  Laterality: N/A;  . LEFT HEART CATHETERIZATION WITH CORONARY ANGIOGRAM N/A 12/15/2012   Procedure: LEFT HEART CATHETERIZATION WITH CORONARY ANGIOGRAM;  Surgeon: Jagadeesh R Ganji, MD;  Location: [facility];  Service: Cardiovascular;  Laterality: N/A;  . LEFT HEART CATHETERIZATION WITH CORONARY ANGIOGRAM N/A 03/26/2013   Procedure: LEFT HEART CATHETERIZATION WITH CORONARY ANGIOGRAM;  Surgeon: Jagadeesh R Ganji, MD;  Location: [facility];  Service: Cardiovascular;  Laterality: N/A;  . LEFT HEART CATHETERIZATION WITH CORONARY ANGIOGRAM N/A 04/08/2014   Procedure: LEFT HEART CATHETERIZATION WITH CORONARY ANGIOGRAM;  Surgeon: Michael D Cooper, MD;  Location: [facility];  Service: Cardiovascular;  Laterality: N/A;  . PERCUTANEOUS CORONARY STENT INTERVENTION (PCI-S) N/A 05/03/2014   Procedure: PERCUTANEOUS CORONARY STENT INTERVENTION (PCI-S);   Surgeon: Jagadeesh R Ganji, MD;  Location: [facility];  Service: Cardiovascular;  Laterality: N/A;  . ROTATOR CUFF REPAIR    . UMBILICAL HERNIA REPAIR  yrs ago   Family History  Adopted: Yes  Problem Relation Age of Onset  . Emphysema Mother    Social History   Socioeconomic History  . Marital status: Married    Spouse name: Pam  . Number of children: 2  . Years of education: Not on file  . Highest education level: Not on file  Occupational History  . Occupation: Truck Driver  Tobacco Use  . Smoking status: Never Smoker  . Smokeless tobacco: Never Used  Vaping Use  . Vaping Use: Never used  Substance and Sexual Activity  . Alcohol use: No  . Drug use: No  . Sexual activity: Not Currently  Other Topics Concern  . Not on file  Social History Narrative  . Not on file   Social Determinants of Health   Financial Resource Strain: Low Risk   . Difficulty of Paying Living Expenses: Not very hard  Food Insecurity: No Food Insecurity  . Worried About Running Out of Food in the Last Year: Never true  . Ran Out of Food in the Last Year: Never true  Transportation Needs: No Transportation Needs  . Lack of Transportation (Medical): No  . Lack of Transportation (Non-Medical): No  Physical Activity: Not on file  Stress: Not on file  Social Connections: Not on file    Tobacco Counseling Counseling given: Not Answered   Clinical Intake:  Pre-visit preparation completed: Yes  Pain : No/denies pain     BMI - recorded: 29 Nutritional Status: BMI 25 -29 Overweight Nutritional Risks: Unintentional weight gain Diabetes: Yes  How often do you need to have someone help you when you read instructions, pamphlets, or other written materials from your doctor or pharmacy?: 2 - Rarely  Diabetic?yes         Activities of Daily Living In your present state of health, do you have any difficulty performing the following activities: 03/02/2020 12/07/2019  Hearing? Y N  Vision?  N N  Difficulty concentrating or making decisions? Y N  Comment more trouble memory -  Walking or climbing stairs? N N  Dressing or bathing? N N  Doing errands, shopping? N -  Comment - -  Preparing Food and eating ? N -  Using the Toilet? N -  In the past six months, have you accidently   leaked urine? Y -  Do you have problems with loss of bowel control? N -  Managing your Medications? N -  Managing your Finances? N -  Housekeeping or managing your Housekeeping? N -  Some recent data might be hidden    Patient Care Team: Gayland Curry, DO as PCP - General (Geriatric Medicine) Adrian Prows, MD as Consulting Physician (Cardiology) Cameron Sprang, MD as Consulting Physician (Neurology) [place], [name], [place] (Optometry) Melida Quitter, MD as Consulting Physician (Otolaryngology) Laretta Alstrom Argie Ramming, RN as [place] any recent [place] you may have received from other than [facility] providers in the past year (date may be approximate).     Assessment:   This is a routine wellness examination for Damon.  Hearing/Vision screen  Hearing Screening   125Hz 250Hz 500Hz 1000Hz 2000Hz 3000Hz 4000Hz 6000Hz 8000Hz  Right ear:           Left ear:           Comments: Patient has a little bit of hearing issues due to past work history  Vision Screening Comments: Patient has no vision problems. Patient had eye exam about 6 months ago. Patient see Dr. [place]  Dietary issues and exercise activities discussed: Current Exercise Habits: The patient does not participate in regular exercise at present  Goals    . Increase water intake     Starting 01/10/16, I will attempt to increase my water intake and try to get around 60oz of water per day.     Resolved due to duplicate goal.    . Monitor and Manage My Blood Sugar     Follow Up Date 05/01/20    - check blood sugar at prescribed times - check blood sugar if I feel it is too high or too low - take the blood  sugar meter to all doctor visits    Why is this important?    Checking your blood sugar at home helps to keep it from getting very high or very low.   Writing the results in a diary or log helps the doctor know how to care for you.   Your blood sugar log should have the time, date and the results.   Also, write down the amount of insulin or other medicine that you take.   Other information, like what you ate, exercise done and how you were feeling, will also be helpful.     Notes:     . Patient will maintian his A1c below 8 within the next 90 days     [place] (see longtitudinal plan of care for additional care plan information)  Objective:  Lab Results  Component Value Date   HGBA1C 7.6 (H) 06/14/2019 .   Lab Results  Component Value Date   CREATININE 0.96 06/14/2019   CREATININE 1.23 05/04/2019   CREATININE 0.93 05/01/2019 .   Marland Kitchen No results found for: EGFR  Current Barriers:  Marland Kitchen Knowledge Deficits related to basic Diabetes pathophysiology and self care/management  Case Manager Clinical Goal(s):  Over the next 90 days, patient will demonstrate improved adherence to prescribed treatment plan for diabetes self care/management as evidenced by:  Marland Kitchen Verbalize daily monitoring and recording of CBG within 90 days . Verbalize adherence to ADA/ carb modified diet within the next 90 days . Verbalize exercise 2-3 days/week . Verbalize adherence to prescribed medication regimen within the next 90 days   Interventions:  . Reviewed medications with patient and  discussed importance of medication adherence . Discussed plans with patient for ongoing care management follow up and provided patient with direct contact information for care management team . Encouraged patient to monitor carbohydrates closely, discussed increasing physical activity by walking routinely and patient stated he may buy a treadmill, discussed eating protein along with 4 ounces of orange juice to manage  hypoglycemia  . 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 Self administers oral medications as prescribed . Self administers insulin as prescribed . Attends all scheduled provider appointments . Checks blood sugars as prescribed and utilize hyper and hypoglycemia protocol as needed . Patient stated that he know that he needs to manage his diet and start exercising again to get his A1c lower.    Please see past updates related to this goal by clicking on the "Past Updates" button in the selected goal   Updated: 01/31/20     . Perform Foot Care     Follow Up Date 05/01/20    - check feet daily for cuts, sores or redness - trim toenails straight across - wash and dry feet carefully every day - wear comfortable, cotton socks - wear comfortable, well-fitting shoes    Why is this important?    Good foot care is very important when you have diabetes.   There are many things you can do to keep your feet healthy and catch a problem early.    Notes:     . Weight (lb) < 170 lb (77.1 kg)     Weight loss with dietary changes, and walking      Depression Screen PHQ 2/9 Scores 03/02/2020 10/28/2019 09/24/2019 06/17/2019 05/31/2019 05/17/2019 05/10/2019  PHQ - 2 Score 0 0 0 0 0 0 0    Fall Risk Fall Risk  03/02/2020 01/31/2020 10/28/2019 09/24/2019 06/17/2019  Falls in the past year? 0 1 0 0 0  Number falls in past yr: 0 0 0 0 0  Comment - - - - -  Injury with Fall? 0 0 0 0 0  Comment - - - - -  Risk for fall due to : - - - - -  Risk for fall due to: Comment - - - - -  Follow up - Falls evaluation completed - Falls prevention discussed;Falls evaluation completed;Education provided -    FALL RISK PREVENTION PERTAINING TO THE HOME:  Any stairs in or around the home? Yes  If so, are there any without handrails? No  Home free of loose throw rugs in walkways, pet  beds, electrical cords, etc? Yes  Adequate lighting in your home to reduce risk of falls? Yes   ASSISTIVE DEVICES UTILIZED TO PREVENT FALLS:  Life alert? No  Use of a cane, walker or w/c? No  Grab bars in the bathroom? No  Shower chair or bench in shower? No  Elevated toilet seat or a handicapped toilet? No   TIMED UP AND GO:  Was the test performed? No .    Cognitive Function: MMSE - Mini Mental State Exam 04/25/2017 01/10/2016  Orientation to time 5 5  Orientation to Place 5 5  Registration 3 3  Attention/ Calculation 2 5  Recall 2 3  Language- name 2 objects 2 2  Language- repeat 1 1  Language- follow 3 step command 3 3  Language- read & follow direction 1 1  Write a sentence 1 1  Copy design 1 1  Total score 26 30     6CIT Screen 03/02/2020 03/02/2019  What Year? 0 points 0 points  What month? 0 points 0 points  What time? 0 points 0 points  Count back from 20 0 points 0 points  Months in reverse 2 points 2 points  Repeat phrase 4 points 0 points  Total Score 6 2    Immunizations Immunization History  Administered Date(s) Administered  . Pneumococcal Conjugate-13 12/01/2013  . Td 03/04/1998, 03/01/2017  . Tdap 03/06/2011, 02/28/2017    TDAP status: Up to date  Flu Vaccine status: Declined, Education has been provided regarding the importance of this vaccine but patient still declined. Advised may receive this vaccine at local pharmacy or Health Dept. Aware to provide a copy of the vaccination record if obtained from local pharmacy or Health Dept. Verbalized acceptance and understanding.  Pneumococcal vaccine status: Due, Education has been provided regarding the importance of this vaccine. Advised may receive this vaccine at local pharmacy or Health Dept. Aware to provide a copy of the vaccination record if obtained from local pharmacy or Health Dept. Verbalized acceptance and understanding.  Covid-19 vaccine status: Declined, Education has been provided  regarding the importance of this vaccine but patient still declined. Advised may receive this vaccine at local pharmacy or Health Dept.or vaccine clinic. Aware to provide a copy of the vaccination record if obtained from local pharmacy or Health Dept. Verbalized acceptance and understanding.  Qualifies for Shingles Vaccine? Yes   Zostavax completed No   Shingrix Completed?: No.    Education has been provided regarding the importance of this vaccine. Patient has been advised to call insurance company to determine out of pocket expense if they have not yet received this vaccine. Advised may also receive vaccine at local pharmacy or Health Dept. Verbalized acceptance and understanding.  Screening Tests Health Maintenance  Topic Date Due  . URINE MICROALBUMIN  08/27/2017  . PNA vac Low Risk Adult (2 of 2 - PPSV23) 03/14/2020 (Originally 12/02/2014)  . INFLUENZA VACCINE  06/01/2020 (Originally 10/03/2019)  . COVID-19 Vaccine (1) 02/01/2021 (Originally 08/19/1962)  . FOOT EXAM  03/14/2020  . OPHTHALMOLOGY EXAM  08/04/2020  . HEMOGLOBIN A1C  08/22/2020  . COLONOSCOPY (Pts 45-76yr Insurance coverage will need to be confirmed)  10/18/2025  . TETANUS/TDAP  03/02/2027  . Hepatitis C Screening  Completed    Health Maintenance  Health Maintenance Due  Topic Date Due  . URINE MICROALBUMIN  08/27/2017    Colorectal cancer screening: Type of screening: Colonoscopy. Completed 2017. Repeat every 10 years  Lung Cancer Screening: (Low Dose CT Chest recommended if Age 624-80years, 30 pack-year currently smoking OR have quit w/in 15years.) does not qualify.   Lung Cancer Screening Referral: na  Additional Screening:  Hepatitis C Screening: does qualify; Completed 04/27/2019   Vision Screening: Recommended annual ophthalmology exams for early detection of glaucoma and other disorders of the eye. Is the patient up to date with their annual eye exam?  Yes  Who is the provider or what is the name of the  office in which the patient attends annual eye exams? [facility] If pt is not established with a provider, would they like to be referred to a provider to establish care? No .   Dental Screening: Recommended annual dental exams for proper oral hygiene  Community Resource Referral / Chronic Care Management: CRR required this visit?  No   CCM  required this visit?  No      Plan:     I have personally reviewed and noted the following in the patient's chart:   . Medical and social history . Use of alcohol, tobacco or illicit drugs  . Current medications and supplements . Functional ability and status . Nutritional status . Physical activity . Advanced directives . List of other physicians . Hospitalizations, surgeries, and ER visits in previous 12 months . Vitals . Screenings to include cognitive, depression, and falls . Referrals and appointments  In addition, I have reviewed and discussed with patient certain preventive protocols, quality metrics, and best practice recommendations. A written personalized care plan for preventive services as well as general preventive health recommendations were provided to patient.     Jessica K Eubanks, NP   03/02/2020    Virtual Visit via Telephone Note  I connected with@ on 03/02/20 at  9:30 AM EST by telephone and verified that I am speaking with the correct person using two identifiers.  Location: Patient: home Provider: [facility].    I discussed the limitations, risks, security and privacy concerns of performing an evaluation and management service by telephone and the availability of in person appointments. I also discussed with the patient that there may be a patient responsible charge related to this service. The patient expressed understanding and agreed to proceed.   I discussed the assessment and treatment plan with the patient. The patient was provided an opportunity to ask questions and all were answered. The  patient agreed with the plan and demonstrated an understanding of the instructions.   The patient was advised to call back or seek an in-person evaluation if the symptoms worsen or if the condition fails to improve as anticipated.  I provided 15 minutes of non-face-to-face time during this encounter.  Jessica K. Eubanks, AGNP Avs printed and mailed      

## 2020-03-04 HISTORY — PX: ROTATOR CUFF REPAIR: SHX139

## 2020-03-06 ENCOUNTER — Other Ambulatory Visit: Payer: Self-pay | Admitting: Internal Medicine

## 2020-03-06 ENCOUNTER — Ambulatory Visit (INDEPENDENT_AMBULATORY_CARE_PROVIDER_SITE_OTHER): Payer: Medicare Other | Admitting: Internal Medicine

## 2020-03-06 ENCOUNTER — Encounter: Payer: Self-pay | Admitting: Internal Medicine

## 2020-03-06 ENCOUNTER — Other Ambulatory Visit: Payer: Self-pay

## 2020-03-06 VITALS — BP 142/90 | HR 88 | Temp 97.3°F | Ht 68.0 in | Wt 196.0 lb

## 2020-03-06 DIAGNOSIS — E7800 Pure hypercholesterolemia, unspecified: Secondary | ICD-10-CM | POA: Diagnosis not present

## 2020-03-06 DIAGNOSIS — E1159 Type 2 diabetes mellitus with other circulatory complications: Secondary | ICD-10-CM

## 2020-03-06 DIAGNOSIS — I272 Pulmonary hypertension, unspecified: Secondary | ICD-10-CM

## 2020-03-06 DIAGNOSIS — IMO0002 Reserved for concepts with insufficient information to code with codable children: Secondary | ICD-10-CM

## 2020-03-06 DIAGNOSIS — E1165 Type 2 diabetes mellitus with hyperglycemia: Secondary | ICD-10-CM | POA: Diagnosis not present

## 2020-03-06 DIAGNOSIS — Z794 Long term (current) use of insulin: Secondary | ICD-10-CM

## 2020-03-06 DIAGNOSIS — J410 Simple chronic bronchitis: Secondary | ICD-10-CM

## 2020-03-06 DIAGNOSIS — I25118 Atherosclerotic heart disease of native coronary artery with other forms of angina pectoris: Secondary | ICD-10-CM

## 2020-03-06 DIAGNOSIS — G2581 Restless legs syndrome: Secondary | ICD-10-CM

## 2020-03-06 DIAGNOSIS — Z1211 Encounter for screening for malignant neoplasm of colon: Secondary | ICD-10-CM | POA: Diagnosis not present

## 2020-03-06 DIAGNOSIS — Z8639 Personal history of other endocrine, nutritional and metabolic disease: Secondary | ICD-10-CM

## 2020-03-06 DIAGNOSIS — D696 Thrombocytopenia, unspecified: Secondary | ICD-10-CM

## 2020-03-06 NOTE — Progress Notes (Signed)
Location:  [facility] clinic Provider:  Emmaly Leech L. Renato Gails, D.O., C.M.D.  Goals of Care:  Advanced Directives 03/06/2020  Does Patient Have a Medical Advance Directive? No  Does patient want to make changes to medical advance directive? No - Patient declined  Would patient like information on creating a medical advance directive? -  Pre-existing out of facility DNR order (yellow form or pink MOST form) -     Chief Complaint  Patient presents with  . Medical Management of Chronic Issues    4 month follow up..... resltless legs syndrome has been bothering him a lot lately, can't stand or sit just shakes.   . Health Maintenance    Urine Microalbumin, colonoscopy     HPI: Patient is a 70 y.o. [name] seen today for medical management of chronic diseases.    Restless leg syndrome as he reportedly has as cause is worse lately.  It is killing him lately.  The longer he sits, the worse he shakes.  He'll come in at night and try to take pramipexole with their dinner at 5pm, then 2 at 8:30-9pm.  Might go to sleep by 10:30, then up at 1:30pm.  He shook so bad driving home last night, he wanted to get out of the car.  Can start in one leg, go up his arm and down the other arm and leg.    Feet are swelling.  He's keeping them elevated.  That does help some.  It worsens as day goes on.  Sometimes, they sting, but not much.  He's using gold bond medicated cream to keep the itch under control.  DMII:  He takes 1 unit novolog for every 15 g of carbs.  If he takes a snack, he might even take some novolog.  hba1c is down to 8.6 from 8.8.  He is looking to get a treadmill to walk.  This will help his sugar, weight and restless legs.  He's up 8 lbs.  He is taking 40 units basaglar at night.  He adds some novolog if high in am when he does his mealtime novolog.  Has had 2-3 lows.  Only happens at night if he has taken novolog and not eaten as much as planned.    He had his shoulder surgery so could not work and physical  activity gone.  He is going to have to start exercising and "sew his mouth shut".   Shoulder is good.  He can feel it with full extension.  Not doing much weights.  Not to golf.     Declines flu shot.  He is not going to take covid vaccine b/c he doesn't know what's in it and he's concerned it will worsen his restless legs.  He's declined the shingrix.  Also says he got sick from the pneumonia shots.  Reminded him of how ill he was with covid infection but he thinks he could have more trouble from the shot than the illness.    Dr. [name] is monitoring his cholesterol.  Just saw him recently.  Only goes annually.  Is taking his praluent as directed and dose was doubled.  Past Medical History:  Diagnosis Date  . Arthritis    "left thumb" (05/23/2017) right shoulder  . Barrett's esophagus    "we've been told that it's all gone; still take RX for GERD" (05/23/2017)  . Bilateral swelling of feet and ankles x 3 weeks as of 12-01-2019  . CAD (coronary artery disease), native coronary artery  Coronary angiogram 05/03/2014:  Proximal LAD 3.0x12 mm Promus premier stent.  04/08/2014: Mid Cx 3.5 x 16 mm Promus DES.  03/29/2013: Mid LAD 2.75 x 38 mm Promus Premier drug-eluting stent, balloon angioplasty of D1 and distal LAD and stenting of distal RCA with 2.75 x 24 mm promos Premier drug-eluting stent 12/15/2012 widely patent.  . Cervicalgia   . Chronic bronchitis (HCC)    "q yr" (05/23/2017)  . Complication of anesthesia    " I do not wake up very well "  . COVID-19 05/2019   all covid symptoms in hospital x 5 days, all symptoms resolved took monoclonal antibody tx   . Family history of adverse reaction to anesthesia    "son w/PONV"  . GERD (gastroesophageal reflux disease)   . Heart murmur    "grew out of it"   . Hyperlipidemia   . Hypoglycemia, unspecified   . 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 Impotence of organic origin   . Iron deficiency anemia   . Other malaise and fatigue    . Pneumonia 05/2019  . PONV (postoperative nausea and vomiting)    after wisdom teeth pulled  . Restless leg   . Rotator cuff arthropathy   . Tension headache    "sometimes" (05/23/2017)  . Unspecified gastritis and gastroduodenitis with hemorrhage   . Unstable angina pectoris (HCC) 04/08/2014   Coronary angiogram 05/03/2014:  Proximal LAD 3.0x12 mm Promus premier stent.  04/08/2014: Mid Cx 3.5 x 16 mm Promus DES.  03/29/2013: Mid LAD 2.75 x 38 mm Promus Premier drug-eluting stent, balloon angioplasty of D1 and distal LAD and stenting of distal RCA with 2.75 x 24 mm promos Premier drug-eluting stent 12/15/2012 widely patent.    Past Surgical History:  Procedure Laterality Date  . BICEPT TENODESIS Right 12/07/2019   Procedure: RIGHT BICEPS TENODESIS;  Surgeon: Sheral ApleyMurphy, Timothy D, MD;  [facility] [place];  Service: Orthopedics;  Laterality: Right;  . CARDIAC CATHETERIZATION N/A 09/25/2015   Procedure: Left Heart Cath and Coronary Angiography;  Surgeon: Yates DecampJay Ganji, MD;  [facility];  Service: Cardiovascular;  Laterality: N/A;  . CARDIAC CATHETERIZATION N/A 09/25/2015   Procedure: Intravascular Pressure Wire/FFR Study;  Surgeon: Yates DecampJay Ganji, MD;  [facility];  Service: Cardiovascular;  Laterality: N/A;  . CORONARY ANGIOPLASTY WITH STENT PLACEMENT  12/15/2012; 04/28/2014; 05/03/2014   "2; 1; 1" , 4 stents 2 balloons  . FRACTIONAL FLOW RESERVE WIRE Right 03/26/2013   Procedure: FRACTIONAL FLOW RESERVE WIRE;  Surgeon: Pamella PertJagadeesh R Ganji, MD;  [facility];  Service: Cardiovascular;  Laterality: Right;  . FRACTIONAL FLOW RESERVE WIRE N/A 05/03/2014   Procedure: FRACTIONAL FLOW RESERVE WIRE;  Surgeon: Pamella PertJagadeesh R Ganji, MD;  [facility];  Service: Cardiovascular;  Laterality: N/A;  . HERNIA REPAIR  2010   "umbilical"   . INCISION AND DRAINAGE ABSCESS Left 05/2007   "groin"   . KNEE SURGERY Right 1992;  2000   "calcium deposits removed"  (12/15/2012)  . LEFT HEART CATH AND CORONARY ANGIOGRAPHY N/A 05/23/2017   Procedure: LEFT HEART CATH AND CORONARY ANGIOGRAPHY;  Surgeon: Yates DecampGanji, Jay, MD;  Location: [facility] INVAS[facility] CV LAB;  Service: Cardiovascular;  Laterality: N/A;  . LEFT HEART CATHETERIZATION WITH CORONARY ANGIOGRAM N/A 12/15/2012   Procedure: LEFT HEART CATHETERIZATION WITH CORONARY ANGIOGRAM;  Surgeon: Pamella PertJagadeesh R Ganji, MD;  [facility];  Service: Cardiovascular;  Laterality: N/A;  . LEFT HEART CATHETERIZATION  WITH CORONARY ANGIOGRAM N/A 03/26/2013   Procedure: LEFT HEART CATHETERIZATION WITH CORONARY ANGIOGRAM;  Surgeon: Pamella Pert, MD;  Location[facility] LAB;  Service: Cardiovascular;  Laterality: N/A;  . LEFT HEART CATHETERIZATION WITH CORONARY ANGIOGRAM N/A 04/08/2014   Procedure: LEFT HEART CATHETERIZATION WITH CORONARY ANGIOGRAM;  Surgeon: Micheline Chapman, MD;  Location[facility] LAB;  Service: Cardiovascular;  Laterality: N/A;  . PERCUTANEOUS CORONARY STENT INTERVENTION (PCI-S) N/A 05/03/2014   Procedure: PERCUTANEOUS CORONARY STENT INTERVENTION (PCI-S);  Surgeon: Pamella Pert, MD;  Location: [facility] C[facility] LAB;  Service: Cardiovascular;  Laterality: N/A;  . ROTATOR CUFF REPAIR    . UMBILICAL HERNIA REPAIR  yrs ago    Allergies  Allergen Reactions  . Lyrica [Pregabalin] Other (See Comments)    Made patient very lethargic the next morning, very hard to patient to function, move, etc.   . Statins Other (See Comments)    Causes Restless Legs, rhabdomyolysis  . Trulicity [Dulaglutide] Other (See Comments)    GI side effects     Outpatient Encounter Medications as of 03/06/2020  Medication Sig  . albuterol (PROVENTIL HFA;VENTOLIN HFA) 108 (90 Base) MCG/ACT inhaler Inhale 2 puffs into the lungs every 6 (six) hours as needed for wheezing or shortness of breath.  . Continuous Blood Gluc Sensor (FREESTYLE LIBRE 14 DAY SENSOR) MISC Use to test blood sugar three times daily. E11.51. E11.59  . D-5000 125 MCG (5000 UT)  TABS TAKE 1 TABLET BY MOUTH EVERY DAY  . famotidine (PEPCID) 20 MG tablet TAKE 1 TABLET BY MOUTH EVERY DAY  . fluticasone (FLONASE) 50 MCG/ACT nasal spray Place 1 spray into both nostrils daily as needed for allergies or rhinitis.  Marland Kitchen gabapentin (NEURONTIN) 100 MG capsule Take Three capsules by mouth once daily at bedtime.  . Insulin Glargine (BASAGLAR KWIKPEN) 100 UNIT/ML INJECT 40 UNITS SUBCUTANEOUSLY INTO THE SKIN AT BEDTIME  . LIFESCAN FINEPOINT LANCETS MISC Use to test for blood sugar three times daily dx: E11.51  . metFORMIN (GLUCOPHAGE) 1000 MG tablet TAKE 1 TABLET BY MOUTH 2 TIMES DAILY WITH A MEAL  . nitroGLYCERIN (NITROSTAT) 0.4 MG SL tablet PLACE 1 TABLET (0.4 MG TOTAL) UNDER THE TONGUE EVERY 5 (FIVE) MINUTES AS NEEDED FOR CHEST PAIN.  Marland Kitchen NOVOLOG FLEXPEN 100 UNIT/ML FlexPen BASED ON CARBOHYDRATE INTAKE  . ONETOUCH ULTRA test strip USE TO TEST FOR BLOOD SUGAR THREE TIMES DAILY DX: E11.51  . pantoprazole (PROTONIX) 40 MG tablet TAKE ONE TABLET BY MOUTH TWICE DAILY FOR STOMACH  . Alirocumab (PRALUENT) 150 MG/ML SOAJ Inject 1 pen into the skin every 14 (fourteen) days.  Marland Kitchen aspirin 81 MG EC tablet Take 1 tablet (81 mg total) by mouth daily.  . BD PEN NEEDLE NANO 2ND GEN 32G X 4 MM MISC USE TWICE DAILY WITH LANTUS PEN  . carvedilol (COREG) 3.125 MG tablet Take 1 tablet (3.125 mg total) by mouth 2 (two) times daily with a meal.  . Continuous Blood Gluc Receiver (FREESTYLE LIBRE 14 DAY READER) DEVI Inject 1 Device as directed daily as needed. Use once daily as direct to check blood sugar E11.51  . pramipexole (MIRAPEX) 1 MG tablet Take 1 mg by mouth 2 (two) times daily at 10 AM and 5 PM. Takes one at 5 pm and 2 before bed   No facility-administered encounter medications on file as of 03/06/2020.    Review of Systems:  Review of Systems  Constitutional: Negative for chills, fever and malaise/fatigue.       Weight gain  HENT: Negative for congestion, hearing loss and sore throat.   Eyes:  Negative for blurred vision.  Respiratory: Negative for cough, shortness of breath and wheezing.        Some dyspnea on exertion he attributes to weight gain/deconditioning  Cardiovascular: Positive for leg swelling. Negative for chest pain and palpitations.  Gastrointestinal: Negative for abdominal pain and constipation.  Genitourinary: Negative for urgency.  Musculoskeletal: Negative for back pain, falls and joint pain.       Right shoulder recovering well  Skin: Negative for rash.  Neurological: Positive for tremors. Negative for dizziness, loss of consciousness and weakness.       Restless legs  Psychiatric/Behavioral: Negative for depression and memory loss. The patient is not nervous/anxious and does not have insomnia.     Health Maintenance  Topic Date Due  . URINE MICROALBUMIN  08/27/2017  . PNA vac Low Risk Adult (2 of 2 - PPSV23) 03/14/2020 (Originally 12/02/2014)  . INFLUENZA VACCINE  06/01/2020 (Originally 10/03/2019)  . COVID-19 Vaccine (1) 02/01/2021 (Originally 08/19/1962)  . FOOT EXAM  03/14/2020  . OPHTHALMOLOGY EXAM  08/04/2020  . HEMOGLOBIN A1C  08/22/2020  . COLONOSCOPY (Pts 45-21yrs Insurance coverage will need to be confirmed)  10/18/2025  . TETANUS/TDAP  03/02/2027  . Hepatitis C Screening  Completed    Physical Exam: Vitals:   03/06/20 1008  BP: (!) 142/90  Pulse: Kenneth  Temp: (!) 97.3 F (36.3 C)  TempSrc: Temporal  SpO2: 97%  Weight: 196 lb (Kenneth.9 kg)  Height: 5\' 8"  (1.727 m)   Body mass index is 29.8 kg/m. Physical Exam Vitals reviewed.  Constitutional:      General: He is not in acute distress.    Appearance: Normal appearance. He is not toxic-appearing.  HENT:     Head: Normocephalic and atraumatic.  Eyes:     Pupils: Pupils are equal, round, and reactive to light.  Cardiovascular:     Rate and Rhythm: Normal rate and regular rhythm.     Pulses: Normal pulses.     Heart sounds: Murmur heard.    Pulmonary:     Effort: Pulmonary effort is  normal.     Breath sounds: Normal breath sounds. No wheezing, rhonchi or rales.  Abdominal:     General: Bowel sounds are normal.     Tenderness: There is no abdominal tenderness. There is no guarding or rebound.  Musculoskeletal:        General: Normal range of motion.     Cervical back: Neck supple.     Right lower leg: Edema present.     Left lower leg: Edema present.     Comments: 2+ edema right and 1+ edema left ankle  Skin:    General: Skin is warm and dry.  Neurological:     General: No focal deficit present.     Mental Status: He is alert and oriented to person, place, and time.     Sensory: Sensory deficit present.     Motor: No weakness.     Gait: Gait normal.     Comments: Rapid tremor of right leg and some tremor of left and upper body following it, feels better when he gets up and walks  Psychiatric:        Mood and Affect: Mood normal.        Behavior: Behavior normal.     Labs reviewed: Basic Metabolic Panel: Recent Labs    04/26/19 0530 04/27/19 0237 04/27/19 0337 04/28/19 0332 04/29/19 0134 10/18/19  4944 12/03/19 1338 12/07/19 0923 02/22/20 0805  NA 135   < >  --  136   < > 138 137 137 135  K 4.0   < >  --  4.6   < > 4.7 4.2 4.5 4.8  CL 100   < >  --  102   < > 102 101 100 101  CO2 28   < >  --  24   < > 29 26  --  28  GLUCOSE 84   < >  --  151*   < > 186* 203* 235* 208*  BUN 21   < >  --  22   < > 18 17 19 19   CREATININE 1.28*   < >  --  1.07   < > 1.09 1.03 1.00 1.07  CALCIUM 7.9*   < >  --  8.2*   < > 9.1 9.1  --  9.4  MG 1.9  --  1.7 2.1  --   --   --   --   --   PHOS 3.2  --  2.4* 4.1  --   --   --   --   --    < > = values in this interval not displayed.   Liver Function Tests: Recent Labs    04/30/19 0200 05/01/19 0344 06/17/19 0816 10/18/19 0811 12/03/19 1338  AST 14* 17 17 20  34  ALT 21 25 15 30  47*  ALKPHOS 68 61  --   --  48  BILITOT 0.7 0.5 0.5 0.5 0.8  PROT 5.7* 5.6* 6.3 6.3 7.0  ALBUMIN 2.3* 2.5*  --   --  4.3   No  results for input(s): LIPASE, AMYLASE in the last 8760 hours. No results for input(s): AMMONIA in the last 8760 hours. CBC: Recent Labs    06/14/19 0807 10/18/19 0811 12/03/19 1338 12/07/19 0923 02/22/20 0805  WBC 4.5 4.4 5.2  --  5.0  NEUTROABS 2,925 2,776  --   --  3,145  HGB 13.1* 14.1 14.8 13.9 14.5  HCT 39.3 41.3 43.4 41.0 42.4  MCV 89.9 87.1 89.1  --  86.0  PLT 129* 118* 155  --  143   Lipid Panel: Recent Labs    03/09/19 0832 04/14/19 0540 06/14/19 0807 06/17/19 0816 10/18/19 0811  CHOL 166  --  162  --  155  HDL 37*  --  31*  --  38*  LDLCALC 103*  --  106*  --  87  TRIG 159* 41 142  --  199*  CHOLHDL 4.5  --  5.2*  --  4.1  LDLDIRECT  --   --   --  110*  --    Lab Results  Component Value Date   HGBA1C 8.6 (H) 02/22/2020     Assessment/Plan 1. Uncontrolled diabetes mellitus with circulatory complication, with long-term current use of insulin (HCC) -hba1c down slightly, did best when home and eating more salads and not on road, but also walking and reminded him that exercise really brought down his glucose before -he plans to get a treadmill and start walking on it -check urine micro -did not change meds today - Microalbumin / creatinine urine ratio - CBC with Differential/Platelet; Future - COMPLETE METABOLIC PANEL WITH GFR; Future - Hemoglobin A1c; Future  2. Simple chronic bronchitis (HCC) -doing well here recently, no changes needed, monitor  3. Pure hypercholesterolemia -cholesterol had trended up and Dr. 06/19/19 increased his praluent  last visit, f/u before I see him b/c he doesn't see cardio again for a year - Lipid panel; Future  4. Restless leg syndrome - worse here lately, check ferritin, but if it's ok, needs neuro f/u -I still think he has some other movement disorder that's yet to be diagnosed--had suggested he see Dr. Arbutus Leas, but he went to [facility] instead and no changes were made that time - Ferritin  5. Coronary artery disease  involving native coronary artery of native heart with other form of angina pectoris (HCC) - no recent chest pain episodes -cont cardio f/u with Dr. [name] and best possible bp, cholesterol and glucose control - CBC with Differential/Platelet; Future - COMPLETE METABOLIC PANEL WITH GFR; Future - Lipid panel; Future  6. Mild pulmonary hypertension (HCC) -? Playing a role now with new edema or simply from inactivity =if edema not getting better with elevation and more exercise, will need further eval--EF was 57% on echo in April with grade 1 diastolic dysfunction and moderate AR vs mild in 2017  7. Thrombocytopenia (HCC) -f/u lab - CBC with Differential/Platelet; Future  8. History of iron deficiency - f/u lab due to worsening restless legs - Ferritin - CBC with Differential/Platelet; Future  9. Colon cancer screening - due for 5 yr recall--seen previously by GI in [place] (prior cscope scanned in procedures) - Ambulatory referral to Gastroenterology  Labs/tests ordered:   Orders Placed This Encounter  Procedures  . Microalbumin / creatinine urine ratio  . Ferritin  . CBC with Differential/Platelet    Standing Status:   Future    Standing Expiration Date:   11/04/2020  . COMPLETE METABOLIC PANEL WITH GFR    Standing Status:   Future    Standing Expiration Date:   11/04/2020  . Hemoglobin A1c    Standing Status:   Future    Standing Expiration Date:   11/04/2020    Order Specific Question:   Release to patient    Answer:   Immediate  . Lipid panel    Standing Status:   Future    Standing Expiration Date:   11/04/2020  . Ambulatory referral to Gastroenterology    Referral Priority:   Routine    Referral Type:   Consultation    Referral Reason:   Specialty Services Required    Number of Visits Requested:   1    Next appt:  07/06/2020 med mgt, fasting labs before  Ponce Skillman L. Haidan Nhan, D.O. Geriatrics [facility] [address], [place]  [address] Cell Phone (Mon-Fri 8am-5pm):  [phone] On Call:  [phone] & follow prompts after 5pm & weekends Office Phone:  [phone] Office Fax:  [phone]

## 2020-03-06 NOTE — Patient Instructions (Addendum)
If iron level not low, please follow-up with neurology about your restless legs.  If your edema does not improve with walking more and elevation of feet, call us back.

## 2020-03-07 DIAGNOSIS — M25511 Pain in right shoulder: Secondary | ICD-10-CM | POA: Diagnosis not present

## 2020-03-07 DIAGNOSIS — M6281 Muscle weakness (generalized): Secondary | ICD-10-CM | POA: Diagnosis not present

## 2020-03-07 DIAGNOSIS — M25611 Stiffness of right shoulder, not elsewhere classified: Secondary | ICD-10-CM | POA: Diagnosis not present

## 2020-03-07 DIAGNOSIS — M75121 Complete rotator cuff tear or rupture of right shoulder, not specified as traumatic: Secondary | ICD-10-CM | POA: Diagnosis not present

## 2020-03-07 LAB — FERRITIN: Ferritin: 28 ng/mL (ref 24–380)

## 2020-03-07 LAB — MICROALBUMIN / CREATININE URINE RATIO
Creatinine, Urine: 93 mg/dL (ref 20–320)
Microalb Creat Ratio: 20 mcg/mg creat (ref <30)
Microalb, Ur: 1.9 mg/dL

## 2020-03-07 NOTE — Progress Notes (Signed)
No significant protein in his urine. Interesting that his ferritin is normal.  It's always been elevated when I've checked it consistent with inflammation and chronic disease.  It is not low though as we were concerned might be causing his worsened "restless legs" so I recommend he follow up with his neurologist

## 2020-03-08 ENCOUNTER — Telehealth: Payer: Self-pay | Admitting: *Deleted

## 2020-03-08 MED ORDER — PRAMIPEXOLE DIHYDROCHLORIDE 1 MG PO TABS: ORAL_TABLET | ORAL | 3 refills | Status: DC

## 2020-03-08 NOTE — Telephone Encounter (Signed)
Yes, that's fine.  Interesting that he did not tell us that he took it differently when he complained about that at his last appt.

## 2020-03-08 NOTE — Telephone Encounter (Signed)
Patient called stating he needs a refill on his Pramipexole.   Stated that he takes:  One at 5pm, one at 7pm and Two at bedtime.   Is it ok to change his directions to this and call in refill.   Please Advise.

## 2020-03-08 NOTE — Telephone Encounter (Signed)
Medication list updated and Rx sent to Pharmacy.  

## 2020-03-09 DIAGNOSIS — M25611 Stiffness of right shoulder, not elsewhere classified: Secondary | ICD-10-CM | POA: Diagnosis not present

## 2020-03-09 DIAGNOSIS — M6281 Muscle weakness (generalized): Secondary | ICD-10-CM | POA: Diagnosis not present

## 2020-03-09 DIAGNOSIS — M75121 Complete rotator cuff tear or rupture of right shoulder, not specified as traumatic: Secondary | ICD-10-CM | POA: Diagnosis not present

## 2020-03-09 DIAGNOSIS — M25511 Pain in right shoulder: Secondary | ICD-10-CM | POA: Diagnosis not present

## 2020-03-13 DIAGNOSIS — M75121 Complete rotator cuff tear or rupture of right shoulder, not specified as traumatic: Secondary | ICD-10-CM | POA: Diagnosis not present

## 2020-03-13 DIAGNOSIS — M25511 Pain in right shoulder: Secondary | ICD-10-CM | POA: Diagnosis not present

## 2020-03-13 DIAGNOSIS — M6281 Muscle weakness (generalized): Secondary | ICD-10-CM | POA: Diagnosis not present

## 2020-03-13 DIAGNOSIS — M25611 Stiffness of right shoulder, not elsewhere classified: Secondary | ICD-10-CM | POA: Diagnosis not present

## 2020-03-16 DIAGNOSIS — M25511 Pain in right shoulder: Secondary | ICD-10-CM | POA: Diagnosis not present

## 2020-03-16 DIAGNOSIS — M75121 Complete rotator cuff tear or rupture of right shoulder, not specified as traumatic: Secondary | ICD-10-CM | POA: Diagnosis not present

## 2020-03-16 DIAGNOSIS — M6281 Muscle weakness (generalized): Secondary | ICD-10-CM | POA: Diagnosis not present

## 2020-03-16 DIAGNOSIS — M25611 Stiffness of right shoulder, not elsewhere classified: Secondary | ICD-10-CM | POA: Diagnosis not present

## 2020-03-21 DIAGNOSIS — M75121 Complete rotator cuff tear or rupture of right shoulder, not specified as traumatic: Secondary | ICD-10-CM | POA: Diagnosis not present

## 2020-03-21 DIAGNOSIS — M6281 Muscle weakness (generalized): Secondary | ICD-10-CM | POA: Diagnosis not present

## 2020-03-21 DIAGNOSIS — M25611 Stiffness of right shoulder, not elsewhere classified: Secondary | ICD-10-CM | POA: Diagnosis not present

## 2020-03-21 DIAGNOSIS — M25511 Pain in right shoulder: Secondary | ICD-10-CM | POA: Diagnosis not present

## 2020-03-22 DIAGNOSIS — K5900 Constipation, unspecified: Secondary | ICD-10-CM | POA: Diagnosis not present

## 2020-03-22 DIAGNOSIS — D509 Iron deficiency anemia, unspecified: Secondary | ICD-10-CM | POA: Diagnosis not present

## 2020-04-03 DIAGNOSIS — M25511 Pain in right shoulder: Secondary | ICD-10-CM | POA: Diagnosis not present

## 2020-04-12 ENCOUNTER — Other Ambulatory Visit: Payer: Self-pay | Admitting: Internal Medicine

## 2020-04-18 DIAGNOSIS — D510 Vitamin B12 deficiency anemia due to intrinsic factor deficiency: Secondary | ICD-10-CM | POA: Diagnosis not present

## 2020-04-18 DIAGNOSIS — D649 Anemia, unspecified: Secondary | ICD-10-CM | POA: Diagnosis not present

## 2020-04-20 DIAGNOSIS — D509 Iron deficiency anemia, unspecified: Secondary | ICD-10-CM | POA: Diagnosis not present

## 2020-04-20 DIAGNOSIS — K591 Functional diarrhea: Secondary | ICD-10-CM | POA: Diagnosis not present

## 2020-04-24 ENCOUNTER — Other Ambulatory Visit: Payer: Self-pay | Admitting: *Deleted

## 2020-04-24 ENCOUNTER — Encounter: Payer: Self-pay | Admitting: Internal Medicine

## 2020-04-24 NOTE — Patient Outreach (Signed)
[facility]) Care Management  04/24/2020  Kenneth Chevalier Sr. [birth year] [phone]  Unsuccessful outreach attempt made to patient. Patient answered the phone and stated that he would not be able to speak today. He did request that this nurse call back at a later date.   Plan: RN Health Coach will call patient within the month of March.  [name] RN, BSN [facility] Care Management  RN Health Coach [phone] [name]

## 2020-05-16 ENCOUNTER — Other Ambulatory Visit: Payer: Self-pay | Admitting: *Deleted

## 2020-05-16 NOTE — Patient Outreach (Signed)
[name], [name]) Care Management  [place]  05/16/2020   [name] Sr. 13-Feb-1951 [id number]  Subjective: Successful telephone outreach call to patient. HIPAA identifiers obtained. Patient reports that he is doing better with controlling his diabetes. 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 Patient did buy a treadmill and explains that he is walking a mile routinely which previously help to control his diabetes better. Nurse discussed with patient about drinking nutritional supplements or Glucerna to prevent blood sugar drops and to pack a cooler of diabetic friendly foods to take with him when he is out on the road. Patient takes his blood sugar several times daily via [employer] which he reports helps him keep much better track of his blood sugar. Patient reports ranges of 70-175 and his last A1c was 8.6 on 02/22/20. Patient did not have any further questions or concerns today and did confirm that he has this nurse's contact number to call her if needed.   Encounter Medications:  Outpatient Encounter Medications as of 05/16/2020  Medication Sig  . albuterol (PROVENTIL HFA;VENTOLIN HFA) 108 (90 Base) MCG/ACT inhaler Inhale 2 puffs into the lungs every 6 (six) hours as needed for wheezing or shortness of breath.  . Alirocumab (PRALUENT) 150 MG/ML SOAJ Inject 1 pen into the skin every 14 (fourteen) days.  Marland Kitchen aspirin 81 MG EC tablet Take 1 tablet (81 mg total) by mouth daily.  . BD PEN NEEDLE NANO 2ND GEN 32G X 4 MM MISC USE AS DIRECTED  . carvedilol (COREG) 3.125 MG tablet Take 1 tablet (3.125 mg total) by mouth 2 (two) times daily with a meal.  . Continuous Blood Gluc Receiver (FREESTYLE LIBRE 14 DAY READER) DEVI Inject 1 Device as directed daily as needed. Use once daily as direct to check blood sugar E11.51  . Continuous Blood Gluc Sensor (FREESTYLE LIBRE 14 DAY SENSOR) MISC Use to test blood sugar three times daily.  E11.51. E11.59  . D-5000 125 MCG (5000 UT) TABS TAKE 1 TABLET BY MOUTH EVERY DAY  . famotidine (PEPCID) 20 MG tablet TAKE 1 TABLET BY MOUTH EVERY DAY  . fluticasone (FLONASE) 50 MCG/ACT nasal spray Place 1 spray into both nostrils daily as needed for allergies or rhinitis.  Marland Kitchen gabapentin (NEURONTIN) 100 MG capsule Take Three capsules by mouth once daily at bedtime.  . Insulin Glargine (BASAGLAR KWIKPEN) 100 UNIT/ML INJECT 40 UNITS SUBCUTANEOUSLY INTO THE SKIN AT BEDTIME  . LIFESCAN FINEPOINT LANCETS MISC Use to test for blood sugar three times daily dx: E11.51  . metFORMIN (GLUCOPHAGE) 1000 MG tablet TAKE 1 TABLET BY MOUTH 2 TIMES DAILY WITH A MEAL  . nitroGLYCERIN (NITROSTAT) 0.4 MG SL tablet PLACE 1 TABLET (0.4 MG TOTAL) UNDER THE TONGUE EVERY 5 (FIVE) MINUTES AS NEEDED FOR CHEST PAIN.  Marland Kitchen NOVOLOG FLEXPEN 100 UNIT/ML FlexPen BASED ON CARBOHYDRATE INTAKE  . ONETOUCH ULTRA test strip USE TO TEST FOR BLOOD SUGAR THREE TIMES DAILY DX: E11.51  . pantoprazole (PROTONIX) 40 MG tablet TAKE ONE TABLET BY MOUTH TWICE DAILY FOR STOMACH  . pramipexole (MIRAPEX) 1 MG tablet Take one tablet by mouth at 5pm, One tablet by mouth at 7pm and take two tablets by mouth at bedtime   No facility-administered encounter medications on file as of 05/16/2020.    Functional Status:  In your present state of health, do you have any difficulty performing the following activities: 03/02/2020 12/07/2019  Hearing? Aggie Moats  Vision? N N  Difficulty concentrating or making decisions? Y N  Comment more trouble memory -  Walking or climbing stairs? N N  Dressing or bathing? N N  Doing errands, shopping? N -  Preparing Food and eating ? N -  Using the Toilet? N -  In the past six months, have you accidently leaked urine? Y -  Do you have problems with loss of bowel control? N -  Managing your Medications? N -  Managing your Finances? N -  Housekeeping or managing your Housekeeping? N -  Some recent data might be hidden     Fall/Depression Screening: Fall Risk  05/16/2020 03/06/2020 03/02/2020  Falls in the past year? 1 0 0  Number falls in past yr: 0 0 0  Comment - - -  Injury with Fall? 0 0 0  Comment - - -  Risk for fall due to : - - -  Risk for fall due to: Comment - - -  Follow up Falls evaluation completed - -   PHQ 2/9 Scores 03/06/2020 03/02/2020 10/28/2019 09/24/2019 06/17/2019 05/31/2019 05/17/2019  PHQ - 2 Score 0 0 0 0 0 0 0    Assessment:  Goals Addressed            This Visit's Progress   . [place] King) Patient will continue to monitor his blood sugar daily and record the values for the next 90 days.       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    Checking your blood sugar at home helps to keep it from getting very high or very low.   Writing the results in a diary or log helps the doctor know how to care for you.   Your blood sugar log should have the time, date and the results.   Also, write down the amount of insulin or other medicine that you take.   Other information, like what you ate, exercise done and how you were feeling, will also be helpful.     Notes: Updated 05/16/20: Patient reports that he is getting better control of his diabetes. 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 Congratulated patient on getting better  control of his diabetes.     . COMPLETED: Patient will maintian his A1c below 8 within the next 90 days       [place] (see longtitudinal plan of care for additional care plan information)  Objective:  Lab Results  Component Value Date   HGBA1C 7.6 (H) 06/14/2019 .   Lab Results  Component Value Date   CREATININE 0.96 06/14/2019   CREATININE 1.23 05/04/2019   CREATININE 0.93 05/01/2019 .   Marland Kitchen No results found for: EGFR  Current Barriers:  Marland Kitchen Knowledge Deficits related to basic Diabetes pathophysiology and self care/management  Case Manager Clinical Goal(s):  Over the next 90 days, patient will demonstrate improved adherence to prescribed treatment plan for diabetes self care/management as evidenced by:  Marland Kitchen Verbalize daily monitoring and recording of CBG within 90 days . Verbalize adherence to ADA/ carb modified diet within the next 90 days . Verbalize exercise 2-3 days/week . Verbalize adherence to prescribed medication regimen within the next 90 days   Interventions:  . Reviewed medications with patient and discussed importance of medication adherence . Discussed plans with patient for ongoing care management follow up and provided patient with direct contact information for care management team . Encouraged patient to monitor carbohydrates closely, discussed increasing physical activity by walking routinely and patient stated he may buy a treadmill, discussed eating protein along with 4 ounces of orange juice to manage hypoglycemia  . 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 Self administers oral medications as prescribed . Self administers insulin as prescribed . Attends all scheduled provider appointments . Checks blood sugars as prescribed and utilize hyper and hypoglycemia protocol as needed . Patient stated that he knows that he needs to  manage his diet and start exercising again to get his A1c lower.    Please see past updates related to this goal by clicking on the "Past Updates" button in the selected goal   Updated: 01/31/20 Resolved due to duplicate goals    . COMPLETED: Perform Foot Care        Follow Up Date 05/16/20   - check feet daily for cuts, sores or redness - trim toenails straight across - wash and dry feet carefully every day - wear comfortable, cotton socks - wear comfortable, well-fitting shoes    Why is this important?    Good foot care is very important when you have diabetes.   There are many things you can do to keep your feet healthy and catch a problem early.    Notes: Patient states that he checks his feet daily for breakdown and reports that he does not have any issues.      Plan: RN Health Coach will send PCP a  Quarterly update and will call patient within the month of June. Follow-up:  Patient agrees to Care Plan and Follow-up.    Emelia Loron RN, BSN [place] [phone] [email or web address]

## 2020-05-16 NOTE — Patient Instructions (Signed)
Goals Addressed            This Visit's Progress   . [facility]) Patient will continue to monitor his blood sugar daily and record the values for the next 90 days.       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    Checking your blood sugar at home helps to keep it from getting very high or very low.   Writing the results in a diary or log helps the doctor know how to care for you.   Your blood sugar log should have the time, date and the results.   Also, write down the amount of insulin or other medicine that you take.   Other information, like what you ate, exercise done and how you were feeling, will also be helpful.     Notes: Updated 05/16/20: Patient reports that he is getting better control of his diabetes. 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 Congratulated patient on getting better control of his diabetes.     . COMPLETED: Patient will maintian his A1c below 8 within the next 90 days       [place] (see longtitudinal plan of care for additional care plan information)  Objective:  Lab Results  Component Value Date   HGBA1C 7.6 (H) 06/14/2019 .   Lab Results  Component Value Date   CREATININE 0.96 06/14/2019   CREATININE 1.23 05/04/2019   CREATININE 0.93 05/01/2019 .   Marland Kitchen No results found for:  EGFR  Current Barriers:  Marland Kitchen Knowledge Deficits related to basic Diabetes pathophysiology and self care/management  Case Manager Clinical Goal(s):  Over the next 90 days, patient will demonstrate improved adherence to prescribed treatment plan for diabetes self care/management as evidenced by:  Marland Kitchen Verbalize daily monitoring and recording of CBG within 90 days . Verbalize adherence to ADA/ carb modified diet within the next 90 days . Verbalize exercise 2-3 days/week . Verbalize adherence to prescribed medication regimen within the next 90 days   Interventions:  . Reviewed medications with patient and discussed importance of medication adherence . Discussed plans with patient for ongoing care management follow up and provided patient with direct contact information for care management team . Encouraged patient to monitor carbohydrates closely, discussed increasing physical activity by walking routinely and patient stated he may buy a treadmill, discussed eating protein along with 4 ounces of orange juice to manage hypoglycemia  . 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 Self administers oral medications as prescribed . Self administers insulin as prescribed . Attends all scheduled provider appointments . Checks blood sugars as prescribed and utilize hyper and hypoglycemia protocol as needed . Patient stated that he knows that he needs to manage his diet and start exercising again to get his A1c lower.    Please see past updates related to this goal by clicking on the "Past Updates" button in the selected goal   Updated: 01/31/20 Resolved due to duplicate goals    . COMPLETED: Perform Foot Care        Follow Up Date 05/16/20   - check feet daily for cuts, sores or redness - trim toenails straight across - wash and dry feet carefully every day - wear  comfortable, cotton socks - wear comfortable, well-fitting shoes    Why is this important?    Good foot care is very important when you have diabetes.   There are many things you can do to keep your feet healthy and catch a problem early.    Notes: Patient states that he checks his feet daily for breakdown and reports that he does not have any issues.

## 2020-05-19 IMAGING — CR DG CHEST 2V
2 series · 2 of 2 positions shown · non-contrast
Comparison: 09/18/2015 from [HOSPITAL]

CLINICAL DATA: Nonproductive cough for 6 months.

EXAM:
CHEST - 2 VIEW

[w chest pa]
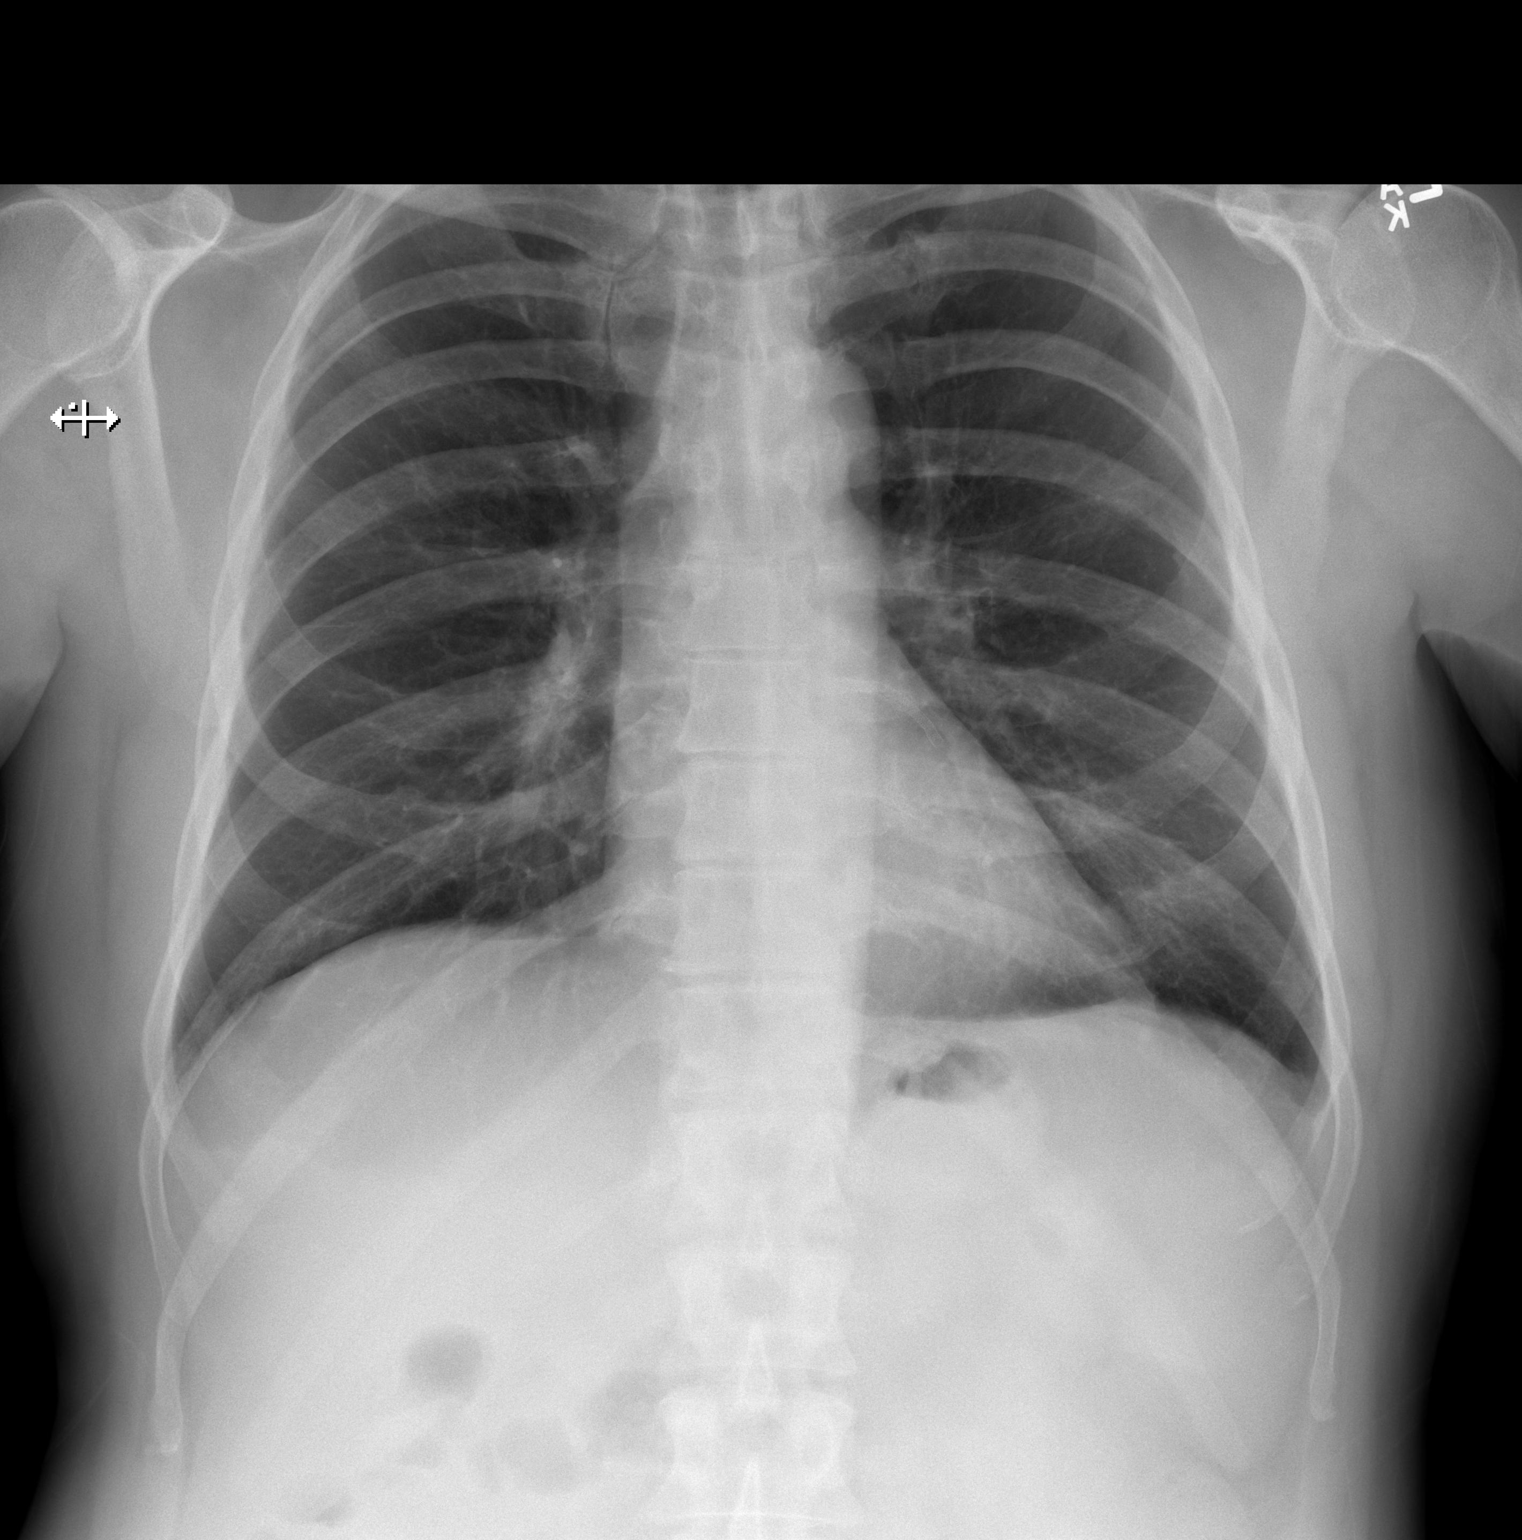

[w chest lat]
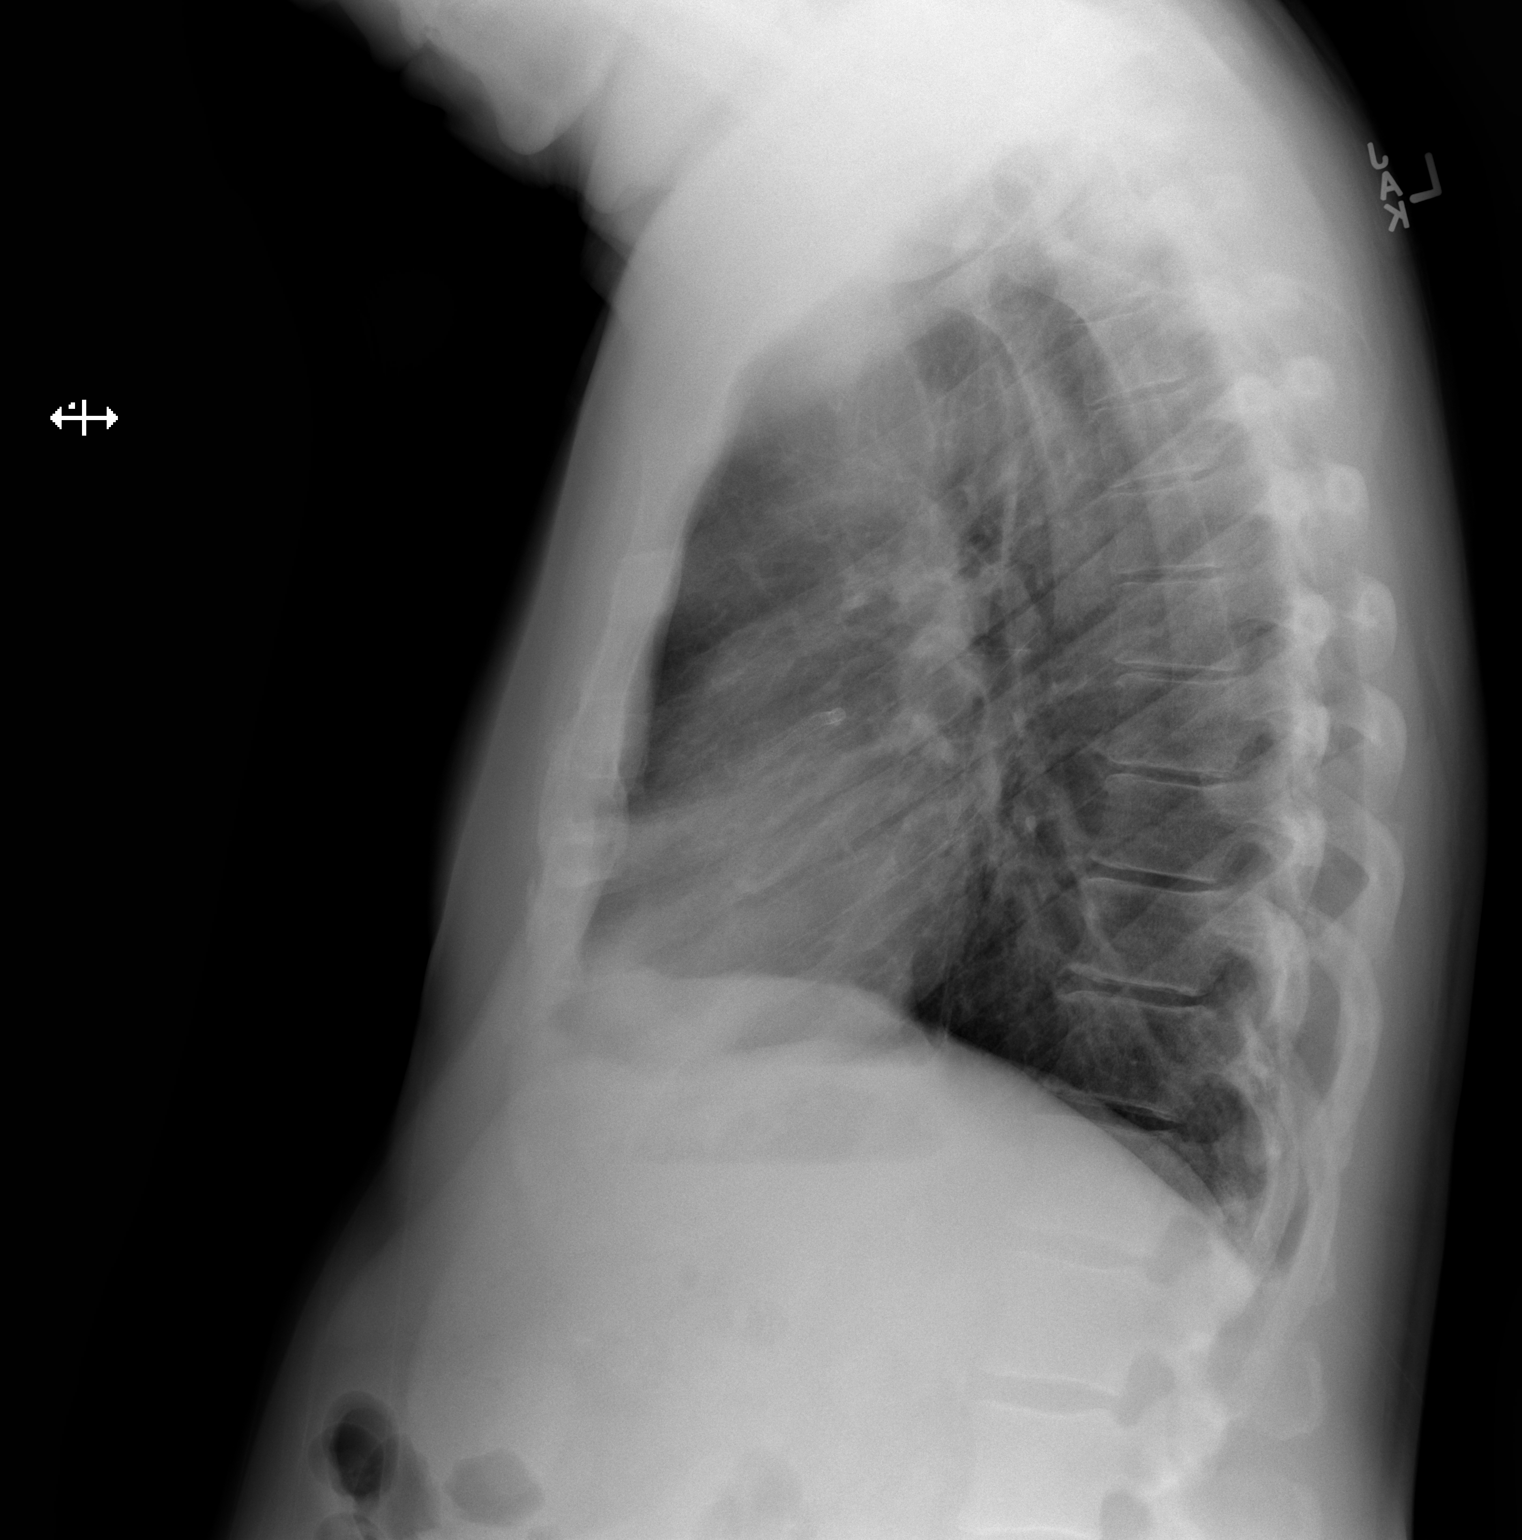

[2 of 2 positions shown; findings below may reference images not displayed]

FINDINGS: The heart size and mediastinal contours are within normal limits.
Both lungs are clear. The visualized skeletal structures are
unremarkable.
IMPRESSION: No active cardiopulmonary disease.

## 2020-05-24 ENCOUNTER — Telehealth: Payer: Self-pay | Admitting: *Deleted

## 2020-05-24 MED ORDER — PANTOPRAZOLE SODIUM 40 MG PO TBEC: DELAYED_RELEASE_TABLET | ORAL | 1 refills | Status: DC

## 2020-05-24 NOTE — Telephone Encounter (Signed)
Thank you :)

## 2020-05-24 NOTE — Telephone Encounter (Signed)
Received fax from [facility] regarding taking Protonix. They are wanting patient to only take it once daily instead of Twice daily.   I called patient and he stated that he is only taking it once daily.   Medication list updated.

## 2020-05-26 IMAGING — DX DG CHEST 1V PORT
1 series · 1 of 1 positions shown · non-contrast
Comparison: Radiograph 09/18/2015

CLINICAL DATA: Chest tightness, QROES-8X positive

EXAM:
PORTABLE CHEST 1 VIEW

[chest ap]
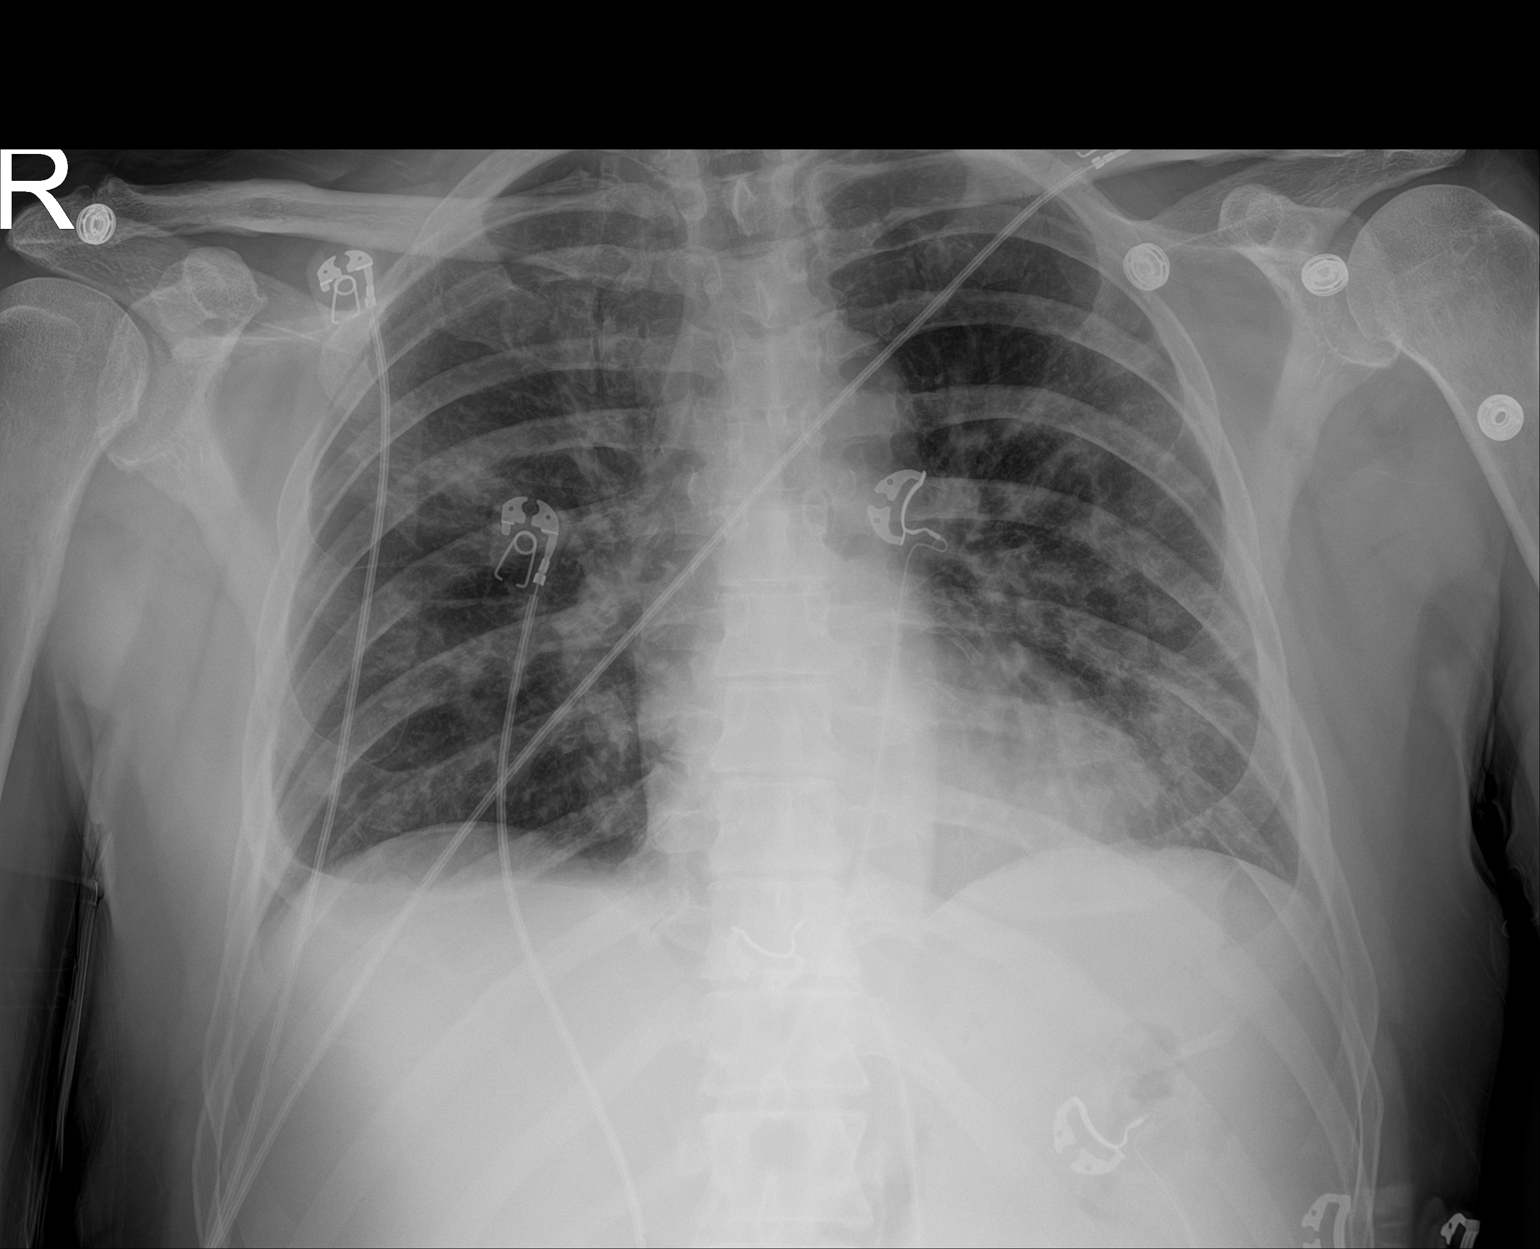

[1 of 1 positions shown; findings below may reference images not displayed]

FINDINGS: Multifocal min areas of patchy airspace opacity present throughout
both lungs. No pneumothorax or effusion. The cardiomediastinal
contours are unremarkable. No acute osseous or soft tissue
abnormality. Mild degenerative changes in the shoulders. Cardiac
monitoring leads overlie the chest.
IMPRESSION: Multifocal airspace disease compatible with atypical pneumonia in
the setting of QROES-8X positivity.

## 2020-05-28 ENCOUNTER — Other Ambulatory Visit: Payer: Self-pay | Admitting: Internal Medicine

## 2020-05-28 DIAGNOSIS — E1159 Type 2 diabetes mellitus with other circulatory complications: Secondary | ICD-10-CM

## 2020-05-28 DIAGNOSIS — IMO0002 Reserved for concepts with insufficient information to code with codable children: Secondary | ICD-10-CM

## 2020-05-31 ENCOUNTER — Other Ambulatory Visit: Payer: Self-pay | Admitting: Internal Medicine

## 2020-05-31 ENCOUNTER — Ambulatory Visit: Payer: Medicare Other | Admitting: Cardiology

## 2020-06-01 ENCOUNTER — Other Ambulatory Visit: Payer: Self-pay | Admitting: Nurse Practitioner

## 2020-06-01 DIAGNOSIS — IMO0002 Reserved for concepts with insufficient information to code with codable children: Secondary | ICD-10-CM

## 2020-06-01 DIAGNOSIS — E1165 Type 2 diabetes mellitus with hyperglycemia: Secondary | ICD-10-CM

## 2020-06-02 ENCOUNTER — Ambulatory Visit: Payer: Medicare Other | Admitting: Cardiology

## 2020-06-02 ENCOUNTER — Other Ambulatory Visit: Payer: Self-pay

## 2020-06-02 ENCOUNTER — Encounter: Payer: Self-pay | Admitting: Cardiology

## 2020-06-02 VITALS — BP 151/80 | HR 75 | Temp 98.5°F | Resp 16 | Ht 68.0 in | Wt 193.0 lb

## 2020-06-02 DIAGNOSIS — I10 Essential (primary) hypertension: Secondary | ICD-10-CM

## 2020-06-02 DIAGNOSIS — E7800 Pure hypercholesterolemia, unspecified: Secondary | ICD-10-CM

## 2020-06-02 DIAGNOSIS — I25118 Atherosclerotic heart disease of native coronary artery with other forms of angina pectoris: Secondary | ICD-10-CM | POA: Diagnosis not present

## 2020-06-02 DIAGNOSIS — I351 Nonrheumatic aortic (valve) insufficiency: Secondary | ICD-10-CM | POA: Diagnosis not present

## 2020-06-02 DIAGNOSIS — R29898 Other symptoms and signs involving the musculoskeletal system: Secondary | ICD-10-CM

## 2020-06-02 MED ORDER — VALSARTAN 160 MG PO TABS: 160.0000 mg | ORAL_TABLET | Freq: Every evening | ORAL | 3 refills | Status: DC

## 2020-06-02 MED ORDER — CARVEDILOL 6.25 MG PO TABS: 3.1250 mg | ORAL_TABLET | Freq: Two times a day (BID) | ORAL | 2 refills | Status: DC

## 2020-06-02 MED ORDER — CARVEDILOL 6.25 MG PO TABS: 6.2500 mg | ORAL_TABLET | Freq: Two times a day (BID) | ORAL | 2 refills | Status: DC

## 2020-06-02 NOTE — Progress Notes (Signed)
Primary Physician/Referring:  [name], NP  Patient ID: Kenneth Sic Sr., male    DOB: Jul 28, 1950, 70 y.o.   MRN: [id number]  Chief Complaint  Patient presents with  . Coronary Artery Disease  . Follow-up    6 month   HPI:    Kenneth Crevier Sr.  is a 70 y.o. caucasian male with coronary artery disease, last cardiac catheterization on 05/29/2017 when he presented with unstable anginal-like symptoms revealing widely patent stent to his LAD, RCA and circumflex and also balloon angioplasty site to D1 and distal LAD.  Fortunately his last PCI has been in 2016.  Past medical history significant for hypertension, hyperlipidemia, GERD, Barrett's esophagus, statin intolerance, uncontrolled diabetes mellitus and also severe restless leg syndrome.  He had protracted illness with COVID-19 infection on 04/09/2019 and he has essentially almost fully recovered with generalized weakness and fatigue.  He has started to gain weight again.  No specific symptoms of chest pain or dyspnea today and denies any dizziness or syncope or palpitations.   Past Medical History:  Diagnosis Date  . Arthritis    "left thumb" (05/23/2017) right shoulder  . Barrett's esophagus    "we've been told that it's all gone; still take RX for GERD" (05/23/2017)  . Bilateral swelling of feet and ankles x 3 weeks as of 12-01-2019  . CAD (coronary artery disease), native coronary artery    Coronary angiogram 05/03/2014:  Proximal LAD 3.0x12 mm Promus premier stent.  04/08/2014: Mid Cx 3.5 x 16 mm Promus DES.  03/29/2013: Mid LAD 2.75 x 38 mm Promus Premier drug-eluting stent, balloon angioplasty of D1 and distal LAD and stenting of distal RCA with 2.75 x 24 mm promos Premier drug-eluting stent 12/15/2012 widely patent.  . Cervicalgia   . Chronic bronchitis (HCC)    "q yr" (05/23/2017)  . Complication of anesthesia    " I do not wake up very well "  . COVID-19 05/2019   all covid symptoms in hospital x 5 days, all symptoms resolved took  monoclonal antibody tx   . Family history of adverse reaction to anesthesia    "son w/PONV"  . GERD (gastroesophageal reflux disease)   . Heart murmur    "grew out of it"   . Hyperlipidemia   . Hypoglycemia, unspecified   . 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 Impotence of organic origin   . Iron deficiency anemia   . Other malaise and fatigue   . Pneumonia 05/2019  . PONV (postoperative nausea and vomiting)    after wisdom teeth pulled  . Restless leg   . Rotator cuff arthropathy   . Tension headache    "sometimes" (05/23/2017)  . Unspecified gastritis and gastroduodenitis with hemorrhage   . Unstable angina pectoris (HCC) 04/08/2014   Coronary angiogram 05/03/2014:  Proximal LAD 3.0x12 mm Promus premier stent.  04/08/2014: Mid Cx 3.5 x 16 mm Promus DES.  03/29/2013: Mid LAD 2.75 x 38 mm Promus Premier drug-eluting stent, balloon angioplasty of D1 and distal LAD and stenting of distal RCA with 2.75 x 24 mm promos Premier drug-eluting stent 12/15/2012 widely patent.   Past Surgical History:  Procedure Laterality Date  . BICEPT TENODESIS Right 12/07/2019   Procedure: RIGHT BICEPS TENODESIS;  Surgeon: Sheral Apley, MD;  Location: [facility];  Service: Orthopedics;  Laterality: Right;  . CARDIAC CATHETERIZATION N/A 09/25/2015   Procedure: Left Heart Cath and Coronary Angiography;  Surgeon: [name], MD;  Location: [facility] INVASIVE CV LAB;  Service: Cardiovascular;  Laterality: N/A;  . CARDIAC CATHETERIZATION N/A 09/25/2015   Procedure: Intravascular Pressure Wire/FFR Study;  Surgeon: [name], MD;  Location: [facility] INVASIVE CV LAB;  Service: Cardiovascular;  Laterality: N/A;  . CORONARY ANGIOPLASTY WITH STENT PLACEMENT  12/15/2012; 04/28/2014; 05/03/2014   "2; 1; 1" , 4 stents 2 balloons  . FRACTIONAL FLOW RESERVE WIRE Right 03/26/2013   Procedure: FRACTIONAL FLOW RESERVE WIRE;  Surgeon: Pamella Pert, MD;  Location: [facility] CATH LAB;  Service: Cardiovascular;   Laterality: Right;  . FRACTIONAL FLOW RESERVE WIRE N/A 05/03/2014   Procedure: FRACTIONAL FLOW RESERVE WIRE;  Surgeon: Pamella Pert, MD;  Location: [facility] CATH LAB;  Service: Cardiovascular;  Laterality: N/A;  . HERNIA REPAIR  2010   "umbilical"   . INCISION AND DRAINAGE ABSCESS Left 05/2007   "groin"   . KNEE SURGERY Right 1992;  2000   "calcium deposits removed" (12/15/2012)  . LEFT HEART CATH AND CORONARY ANGIOGRAPHY N/A 05/23/2017   Procedure: LEFT HEART CATH AND CORONARY ANGIOGRAPHY;  Surgeon: [name], MD;  Location: [facility] INVASIVE CV LAB;  Service: Cardiovascular;  Laterality: N/A;  . LEFT HEART CATHETERIZATION WITH CORONARY ANGIOGRAM N/A 12/15/2012   Procedure: LEFT HEART CATHETERIZATION WITH CORONARY ANGIOGRAM;  Surgeon: Pamella Pert, MD;  Location: [facility] CATH LAB;  Service: Cardiovascular;  Laterality: N/A;  . LEFT HEART CATHETERIZATION WITH CORONARY ANGIOGRAM N/A 03/26/2013   Procedure: LEFT HEART CATHETERIZATION WITH CORONARY ANGIOGRAM;  Surgeon: Pamella Pert, MD;  Location: [facility] CATH LAB;  Service: Cardiovascular;  Laterality: N/A;  . LEFT HEART CATHETERIZATION WITH CORONARY ANGIOGRAM N/A 04/08/2014   Procedure: LEFT HEART CATHETERIZATION WITH CORONARY ANGIOGRAM;  Surgeon: Micheline Chapman, MD;  Location: [facility] CATH LAB;  Service: Cardiovascular;  Laterality: N/A;  . PERCUTANEOUS CORONARY STENT INTERVENTION (PCI-S) N/A 05/03/2014   Procedure: PERCUTANEOUS CORONARY STENT INTERVENTION (PCI-S);  Surgeon: Pamella Pert, MD;  Location: [facility] CATH LAB;  Service: Cardiovascular;  Laterality: N/A;  . ROTATOR CUFF REPAIR    . UMBILICAL HERNIA REPAIR  yrs ago   Social History   Tobacco Use  . Smoking status: Never Smoker  . Smokeless tobacco: Never Used  Substance Use Topics  . Alcohol use: No     ROS  Review of Systems  Constitutional: Negative for malaise/fatigue.  Cardiovascular: Positive for claudication. Negative for dyspnea on exertion and leg swelling.  Musculoskeletal:  Positive for muscle cramps (restless leg).   Objective   Vitals with BMI 06/02/2020 03/06/2020 01/04/2020  Height 5\' 8"  5\' 8"  5\' 8"   Weight 193 lbs 196 lbs 188 lbs 3 oz  BMI 29.35 29.81 28.62  Systolic 151 142  Diastolic 80 90 86  Pulse 75 88 75    Blood pressure (!) 151/80, pulse 75, temperature 98.5 F (36.9 C), temperature source Temporal, resp. rate 16, height 5\' 8"  (1.727 m), weight 193 lb (87.5 kg), SpO2 98 %. Body mass index is 29.35 kg/m.   Physical Exam Constitutional:      Appearance: He is well-developed.  Neck:     Vascular: No carotid bruit.  Cardiovascular:     Rate and Rhythm: Normal rate and regular rhythm.     Pulses:          Carotid pulses are 2+ on the right side and 2+ on the left side.      Femoral pulses are 2+ on the right side and 2+ on the left side.      Dorsalis  pedis pulses are 0 on the right side and 0 on the left side.       Posterior tibial pulses are 1+ on the right side and 0 on the left side.     Heart sounds: Murmur heard.   Blowing decrescendo early diastolic murmur is present at the upper right sternal border radiating to the apex. No gallop.      Comments: No edema.  There is no JVD. Pulmonary:     Effort: Pulmonary effort is normal.     Breath sounds: Normal breath sounds.  Abdominal:     General: Bowel sounds are normal.     Palpations: Abdomen is soft.  Musculoskeletal:        General: No swelling. Normal range of motion.    Radiology: No results found.  Laboratory examination:   Recent Labs    10/18/19 0811 12/03/19 1338 12/07/19 0923 02/22/20 0805  NA 138 137 137 135  K 4.7 4.2 4.5 4.8  CL 102 101 100 101  CO2 29 26  --  28  GLUCOSE 186* 203* 235* 208*  BUN 18 17 19 19   CREATININE 1.09 1.03 1.00 1.07  CALCIUM 9.1 9.1  --  9.4  GFRNONAA 68 >60  --  70  GFRAA 79 >60  --  81   CMP Latest Ref Rng & Units 02/22/2020 12/07/2019 12/03/2019  Glucose 65 - 99 mg/dL 409(W208(H) 119(J235(H) 478(G203(H)  BUN 7 - 25 mg/dL 19 19 17    Creatinine 0.70 - 1.18 mg/dL 9.561.07 2.131.00 0.861.03  Sodium 135 - 146 mmol/L 135 137 137  Potassium 3.5 - 5.3 mmol/L 4.8 4.5 4.2  Chloride 98 - 110 mmol/L 101 100 101  CO2 20 - 32 mmol/L 28 - 26  Calcium 8.6 - 10.3 mg/dL 9.4 - 9.1  Total Protein 6.5 - 8.1 g/dL - - 7.0  Total Bilirubin 0.3 - 1.2 mg/dL - - 0.8  Alkaline Phos 38 - 126 U/L - - 48  AST 15 - 41 U/L - - 34  ALT 0 - 44 U/L - - 47(H)   CBC Latest Ref Rng & Units 02/22/2020 12/07/2019 12/03/2019  WBC 3.8 - 10.8 Thousand/uL 5.0 - 5.2  Hemoglobin 13.2 - 17.1 g/dL 57.814.5 46.913.9 62.914.8  Hematocrit 38.5 - 50.0 % 42.4 41.0 43.4  Platelets 140 - 400 Thousand/uL 143 - 155   Lipid Panel Recent Labs    06/14/19 0807 06/17/19 0816 10/18/19 0811  CHOL 162  --  155  TRIG 142  --  199*  LDLCALC 106*  --  87  HDL 31*  --  38*  CHOLHDL 5.2*  --  4.1  LDLDIRECT  --  110*  --     HEMOGLOBIN A1C Lab Results  Component Value Date   HGBA1C 8.6 (H) 02/22/2020   MPG 200 02/22/2020   TSH No results for input(s): TSH in the last 8760 hours. Medications and allergies   Allergies  Allergen Reactions  . Lyrica [Pregabalin] Other (See Comments)    Made patient very lethargic the next morning, very hard to patient to function, move, etc.   . Statins Other (See Comments)    Causes Restless Legs, rhabdomyolysis  . Trulicity [Dulaglutide] Other (See Comments)    GI side effects     Current Outpatient Medications on File Prior to Visit  Medication Sig Dispense Refill  . albuterol (PROVENTIL HFA;VENTOLIN HFA) 108 (90 Base) MCG/ACT inhaler Inhale 2 puffs into the lungs every 6 (six) hours as needed for wheezing  or shortness of breath. 18 g 1  . Alirocumab (PRALUENT) 150 MG/ML SOAJ Inject 1 pen into the skin every 14 (fourteen) days. 6 mL 3  . aspirin 81 MG EC tablet Take 1 tablet (81 mg total) by mouth daily. 60 tablet 0  . BD PEN NEEDLE NANO 2ND GEN 32G X 4 MM MISC USE AS DIRECTED 100 each 3  . Continuous Blood Gluc Receiver (FREESTYLE LIBRE 14 DAY  READER) DEVI Inject 1 Device as directed daily as needed. Use once daily as direct to check blood sugar E11.51 1 each 0  . Continuous Blood Gluc Sensor (FREESTYLE LIBRE 14 DAY SENSOR) MISC Use to test blood sugar three times daily. E11.51. E11.59 2 each 12  . D-5000 125 MCG (5000 UT) TABS TAKE 1 TABLET BY MOUTH EVERY DAY 90 tablet 1  . famotidine (PEPCID) 20 MG tablet TAKE 1 TABLET BY MOUTH EVERY DAY 30 tablet 1  . fluticasone (FLONASE) 50 MCG/ACT nasal spray Place 1 spray into both nostrils daily as needed for allergies or rhinitis.    Marland Kitchen gabapentin (NEURONTIN) 100 MG capsule Take Three capsules by mouth once daily at bedtime. 90 capsule 3  . Insulin Glargine (BASAGLAR KWIKPEN) 100 UNIT/ML INJECT 40 UNITS SUBCUTANEOUSLY INTO THE SKIN AT BEDTIME 45 mL 4  . LIFESCAN FINEPOINT LANCETS MISC Use to test for blood sugar three times daily dx: E11.51 300 each 1  . metFORMIN (GLUCOPHAGE) 1000 MG tablet TAKE 1 TABLET BY MOUTH 2 TIMES DAILY WITH A MEAL 180 tablet 0  . nitroGLYCERIN (NITROSTAT) 0.4 MG SL tablet PLACE 1 TABLET (0.4 MG TOTAL) UNDER THE TONGUE EVERY 5 (FIVE) MINUTES AS NEEDED FOR CHEST PAIN. 50 tablet 0  . NOVOLOG FLEXPEN 100 UNIT/ML FlexPen BASED ON CARBOHYDRATE INTAKE 15 mL 11  . ONETOUCH ULTRA test strip USE TO TEST FOR BLOOD SUGAR THREE TIMES DAILY DX: E11.51 300 strip 11  . pantoprazole (PROTONIX) 40 MG tablet Take one tablet by mouth once daily for stomach 90 tablet 1  . pramipexole (MIRAPEX) 1 MG tablet TAKE 1 TABLET BY MOUTH AT 5PM, 1 TABLET BY MOUTH AT 7PM, AND TAKE 2 TABLETS BY MOUTH AT BEDTIME. 360 tablet 1  . furosemide (LASIX) 20 MG tablet Take 20 mg by mouth daily.     No current facility-administered medications on file prior to visit.    Cardiac Studies:   Coronary angiogram 09/25/2015: Ostial Cx 50-60%, FFR 0.86. LVEF 60%. Patent stent placed 05/03/2014: Proximal LAD 3.0x12 mm Promus DES. 04/08/2014: Cx/OM1 with 3.5 x 16 mm Promus DES. H/O Mid LAD 2.75 x 38 mm Promus Premier DES,  balloon angioplasty of D1 and distal LAD(03/29/2013) and Distal RCA stent (12/15/2012) with 2.75 x 24 mm promos Premier DES.  Lower extremity venous duplex for insufficiency 05/29/2017: Bilateral lower extremities negative for DVT or reflux.  Coronary angiogram 05/30/2019 and 05/23/2017: All stents placed previously are patent.  Proximal to mid LAD long stent (Overlaps prior stent) that was placed on 04/08/2014, circumflex/obtuse marginal-1 stenting. History of mid LAD stenting and balloon angioplasty to D1 and distal LAD performed on 03/29/2013, distal RCA stent in 2014. No change in ostial circumflex lesion, the mid LAD stent is widely patent however at the outflow, there is a 30-40% stenosis.  Lexiscan Tetrofosmin stress test 05/31/2019: No previous exam available for comparison. Lexiscan nuclear stress test performed using 1-day protocol. Stress EKG is non-diagnostic, as this is pharmacological stress test. In addition, stress EKG at 85% MPHR shows sinus tachycardia, no ischemic changes.  Myocardial perfusion imaging is normal. Left ventricular ejection fraction is  71% with normal wall motion.  Low risk study.   Echocardiogram 06/08/2019: Left ventricle cavity is normal in size. Mild concentric hypertrophy of  the left ventricle. Normal global wall motion. Normal LV systolic function  with EF 57%. Doppler evidence of grade I (impaired) diastolic dysfunction,  normal LAP.  Trileaflet aortic valve. Moderate (Grade II) aortic regurgitation.  Mild to moderate tricuspid regurgitation.  Mild pulmonary hypertension. Estimated pulmonary artery systolic pressure  is 39 mmHg.  Compared to previous study in 2017, aortic regurgitation severity  increased from mild to moderate. Mild PH is new.   EKG:    EKG 06/02/2020: Normal sinus rhythm at rate of 71 bpm, left axis deviation, left anterior fascicular block.  Incomplete right bundle branch block.  Poor R wave progression, cannot exclude anteroseptal  infarct old.  Compared to 12/01/2019, leftward axis is new.     Assessment     ICD-10-CM   1. Coronary artery disease of native artery of native heart with stable angina pectoris (HCC)  I25.118 EKG 12-Lead    PCV ECHOCARDIOGRAM COMPLETE    carvedilol (COREG) 6.25 MG tablet    DISCONTINUED: carvedilol (COREG) 6.25 MG tablet  2. Essential hypertension  I10 valsartan (DIOVAN) 160 MG tablet  3. Moderate aortic regurgitation  I35.1 PCV ECHOCARDIOGRAM COMPLETE  4. Pure hypercholesterolemia  E78.00   5. Weakness of both lower extremities  R29.898 PCV LOWER ARTERIAL (BILATERAL)   Meds ordered this encounter  Medications  . DISCONTD: carvedilol (COREG) 6.25 MG tablet    Sig: Take 0.5 tablets (3.125 mg total) by mouth 2 (two) times daily with a meal.    Dispense:  180 tablet    Refill:  2  . valsartan (DIOVAN) 160 MG tablet    Sig: Take 1 tablet (160 mg total) by mouth every evening.    Dispense:  90 tablet    Refill:  3  . carvedilol (COREG) 6.25 MG tablet    Sig: Take 1 tablet (6.25 mg total) by mouth 2 (two) times daily with a meal.    Dispense:  180 tablet    Refill:  2    Medications Discontinued During This Encounter  Medication Reason  . carvedilol (COREG) 3.125 MG tablet Reorder  . carvedilol (COREG) 6.25 MG tablet     Recommendations:   Adolf Ormiston Sr.  is a 69 y.o. caucasian male with coronary artery disease, last cardiac catheterization on 05/29/2017 when he presented with unstable anginal-like symptoms revealing widely patent stent to his LAD, RCA and circumflex and also balloon angioplasty site to D1 and distal LAD.  Fortunately his last PCI has been in 2016.  Past medical history significant for hypertension, hyperlipidemia, GERD, Barrett's esophagus, statin intolerance, uncontrolled diabetes mellitus and also severe restless leg syndrome.  He was admitted to the hospital after Covid 19 infection on 04/09/2019 and was again readmitted on 05/01/2019 and received remdesivir and  also Decadron.  He had a protracted course but has fortunately recovered well.  He has not had any recurrence of angina pectoris, he is on appropriate guideline directed medical therapy although his diabetes continues to be uncontrolled because of his poor dietary habits.  Also since carvedilol and valsartan was discontinued previously during Covid infection and low blood pressure, his blood pressure has now uncontrolled.  I have restarted him back but at 6.25 mg of Coreg twice daily and also increased his valsartan from 80 mg he was taking previously  260 mg in the evening.  I was going to obtain routine labs including CBC, CMP and lipids and A1c as he is due for this however he already has these labs scheduled by Ms. Abbey Chatters, NP, his PCP in the next 3 weeks.  It would be appropriate to follow-up on this as I am starting him on ARB.  In view of his complex medical issues, I would like to see him back in 6 to 8 weeks for follow-up.  He complains of marked weakness in his legs, peripheral pulses are reduced and his symptoms although are probably related to microvascular disease and small vessel disease in his legs, significant PAD need to be excluded, will obtain lower extremity arterial duplex.  In view of moderate aortic regurgitation, the murmur appears slightly more prominent today, will repeat his echocardiogram as well.   [name], MD, [facility] 06/02/2020, 5:03 PM Office: [phone]

## 2020-06-15 IMAGING — DX DG CHEST 1V PORT
1 series · 1 of 1 positions shown · non-contrast
Comparison: Chest x-ray [DATE].

CLINICAL DATA: 68-year-old male with history of shortness of breath
today. History of [id number] diagnosed [REDACTED].

EXAM:
PORTABLE CHEST 1 VIEW

[chest ap]
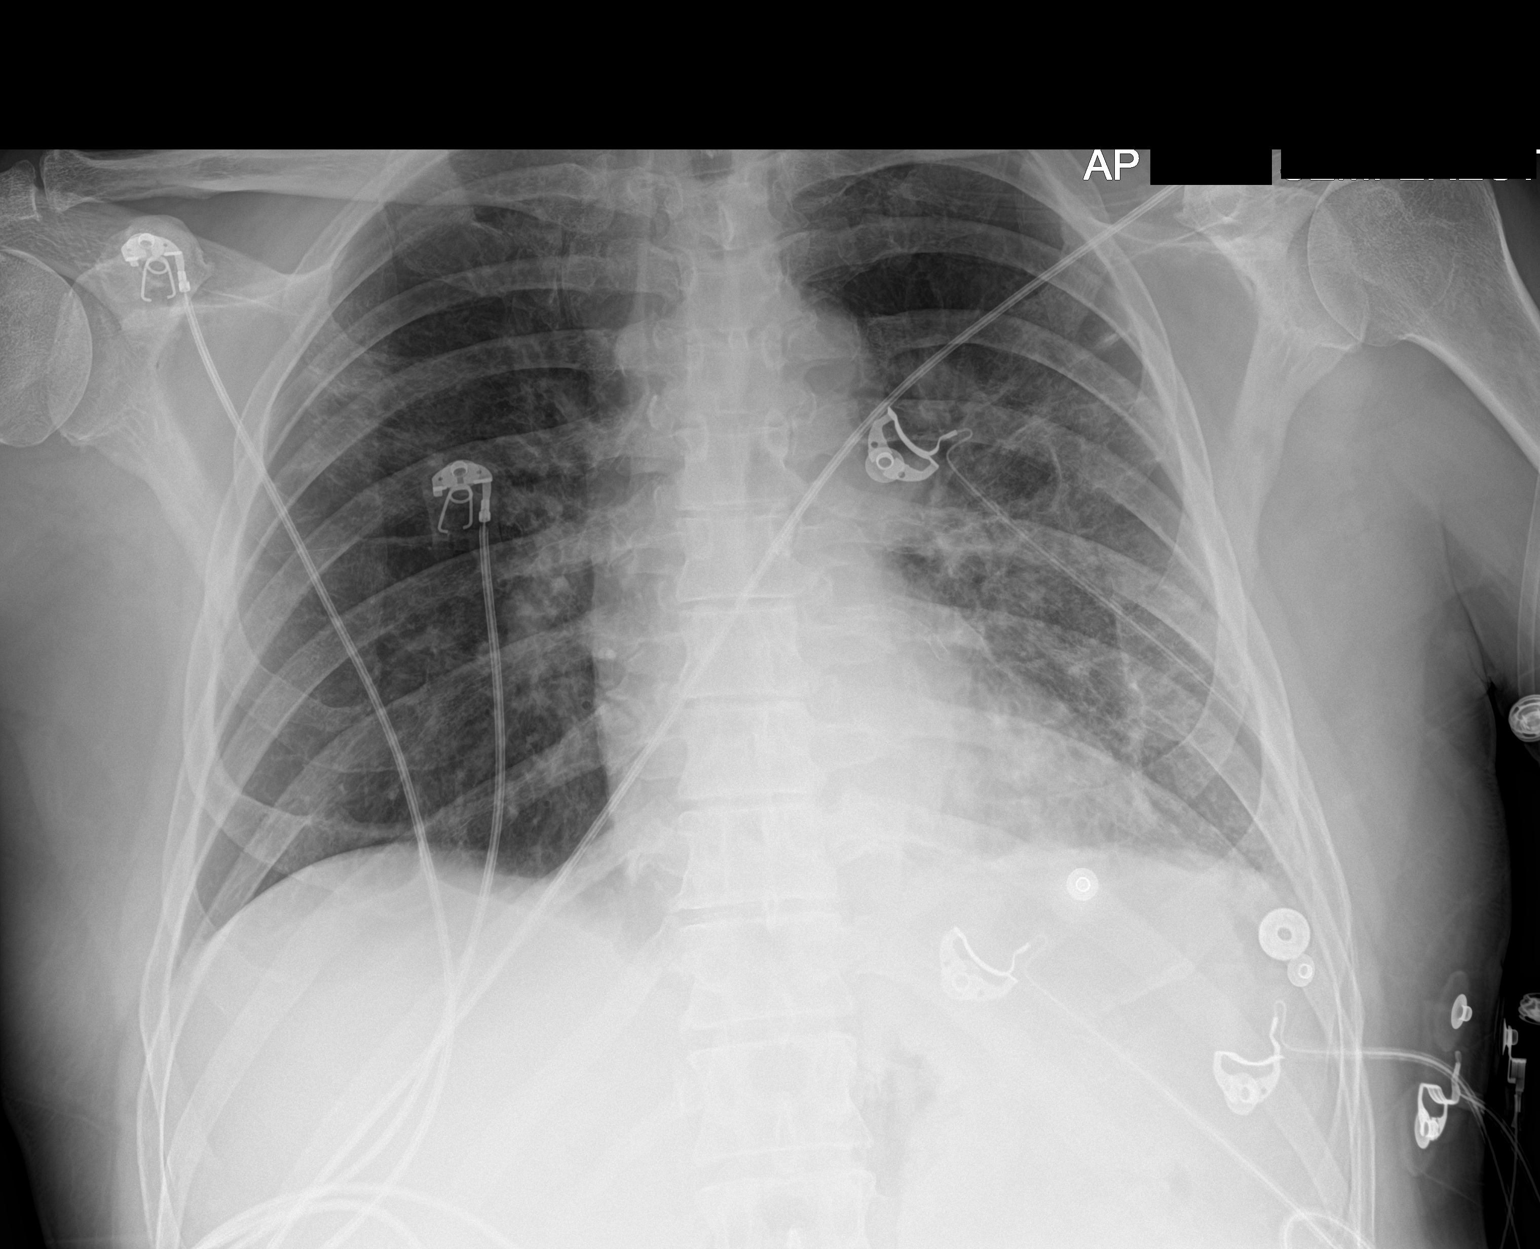

[1 of 1 positions shown; findings below may reference images not displayed]

FINDINGS: Lung volumes are low. Patchy ill-defined opacities and areas of
interstitial prominence are noted throughout the lungs bilaterally
(left greater than right), most severe throughout the left mid to
lower lung. Aeration has minimally improved compared to the prior
examination. No new areas of airspace consolidation. No pleural
effusions. No evidence of pulmonary edema. Heart size is normal.
Upper mediastinal contours are within normal limits.
IMPRESSION: 1. The appearance of the lungs is compatible with resolving
multilobar bilateral pneumonia (left greater than right) with
probable developing post infectious or inflammatory scarring.

## 2020-06-22 ENCOUNTER — Ambulatory Visit: Payer: Medicare Other

## 2020-06-22 ENCOUNTER — Other Ambulatory Visit: Payer: Self-pay

## 2020-06-22 DIAGNOSIS — R29898 Other symptoms and signs involving the musculoskeletal system: Secondary | ICD-10-CM | POA: Diagnosis not present

## 2020-06-22 DIAGNOSIS — I351 Nonrheumatic aortic (valve) insufficiency: Secondary | ICD-10-CM

## 2020-06-22 DIAGNOSIS — I25118 Atherosclerotic heart disease of native coronary artery with other forms of angina pectoris: Secondary | ICD-10-CM

## 2020-06-22 DIAGNOSIS — I10 Essential (primary) hypertension: Secondary | ICD-10-CM | POA: Diagnosis not present

## 2020-06-25 NOTE — Progress Notes (Signed)
No significant disease in your circulation, your leg pain is coming from peripheral neuropathy and also restless leg syndrome.

## 2020-06-30 ENCOUNTER — Other Ambulatory Visit: Payer: Self-pay

## 2020-06-30 ENCOUNTER — Other Ambulatory Visit: Payer: Medicare Other

## 2020-06-30 DIAGNOSIS — E1159 Type 2 diabetes mellitus with other circulatory complications: Secondary | ICD-10-CM

## 2020-06-30 DIAGNOSIS — Z794 Long term (current) use of insulin: Secondary | ICD-10-CM | POA: Diagnosis not present

## 2020-06-30 DIAGNOSIS — E1165 Type 2 diabetes mellitus with hyperglycemia: Secondary | ICD-10-CM | POA: Diagnosis not present

## 2020-06-30 DIAGNOSIS — IMO0002 Reserved for concepts with insufficient information to code with codable children: Secondary | ICD-10-CM

## 2020-07-01 LAB — CBC WITH DIFFERENTIAL/PLATELET
Absolute Monocytes: 435 cells/uL (ref 200–950)
Basophils Absolute: 29 cells/uL (ref 0–200)
Basophils Relative: 0.5 %
Eosinophils Absolute: 122 cells/uL (ref 15–500)
Eosinophils Relative: 2.1 %
HCT: 44.3 % (ref 38.5–50.0)
Hemoglobin: 15 g/dL (ref 13.2–17.1)
Lymphs Abs: 1230 cells/uL (ref 850–3900)
MCH: 29.1 pg (ref 27.0–33.0)
MCHC: 33.9 g/dL (ref 32.0–36.0)
MCV: 86 fL (ref 80.0–100.0)
MPV: 11 fL (ref 7.5–12.5)
Monocytes Relative: 7.5 %
Neutro Abs: 3985 cells/uL (ref 1500–7800)
Neutrophils Relative %: 68.7 %
Platelets: 131 10*3/uL — ABNORMAL LOW (ref 140–400)
RBC: 5.15 10*6/uL (ref 4.20–5.80)
RDW: 12.6 % (ref 11.0–15.0)
Total Lymphocyte: 21.2 %
WBC: 5.8 10*3/uL (ref 3.8–10.8)

## 2020-07-01 LAB — COMPLETE METABOLIC PANEL WITH GFR
AG Ratio: 2.3 (calc) (ref 1.0–2.5)
ALT: 36 U/L (ref 9–46)
AST: 25 U/L (ref 10–35)
Albumin: 4.6 g/dL (ref 3.6–5.1)
Alkaline phosphatase (APISO): 54 U/L (ref 35–144)
BUN/Creatinine Ratio: 20 (calc) (ref 6–22)
BUN: 25 mg/dL (ref 7–25)
CO2: 30 mmol/L (ref 20–32)
Calcium: 9.7 mg/dL (ref 8.6–10.3)
Chloride: 102 mmol/L (ref 98–110)
Creat: 1.25 mg/dL — ABNORMAL HIGH (ref 0.70–1.18)
GFR, Est African American: 67 mL/min/{1.73_m2} (ref >=60)
GFR, Est Non African American: 58 mL/min/{1.73_m2} — ABNORMAL LOW (ref >=60)
Globulin: 2 g/dL (calc) (ref 1.9–3.7)
Glucose, Bld: 152 mg/dL — ABNORMAL HIGH (ref 65–99)
Potassium: 5.2 mmol/L (ref 3.5–5.3)
Sodium: 137 mmol/L (ref 135–146)
Total Bilirubin: 0.6 mg/dL (ref 0.2–1.2)
Total Protein: 6.6 g/dL (ref 6.1–8.1)

## 2020-07-01 LAB — LIPID PANEL
Cholesterol: 104 mg/dL (ref <200)
HDL: 42 mg/dL (ref >=40)
LDL Cholesterol (Calc): 37 mg/dL (calc)
Non-HDL Cholesterol (Calc): 62 mg/dL (calc) (ref <130)
Total CHOL/HDL Ratio: 2.5 (calc) (ref <5.0)
Triglycerides: 173 mg/dL — ABNORMAL HIGH (ref <150)

## 2020-07-01 LAB — HEMOGLOBIN A1C
Hgb A1c MFr Bld: 8.7 % of total Hgb — ABNORMAL HIGH (ref <5.7)
Mean Plasma Glucose: 203 mg/dL
eAG (mmol/L): 11.2 mmol/L

## 2020-07-05 ENCOUNTER — Other Ambulatory Visit: Payer: Self-pay

## 2020-07-05 ENCOUNTER — Ambulatory Visit: Payer: Medicare Other | Admitting: Nurse Practitioner

## 2020-07-06 ENCOUNTER — Ambulatory Visit: Payer: Medicare Other | Admitting: Internal Medicine

## 2020-07-07 ENCOUNTER — Encounter: Payer: Self-pay | Admitting: Nurse Practitioner

## 2020-07-07 ENCOUNTER — Ambulatory Visit (INDEPENDENT_AMBULATORY_CARE_PROVIDER_SITE_OTHER): Payer: Medicare Other | Admitting: Nurse Practitioner

## 2020-07-07 ENCOUNTER — Other Ambulatory Visit: Payer: Self-pay

## 2020-07-07 VITALS — BP 126/72 | HR 68 | Temp 97.5°F | Ht 68.0 in | Wt 192.6 lb

## 2020-07-07 DIAGNOSIS — I10 Essential (primary) hypertension: Secondary | ICD-10-CM

## 2020-07-07 DIAGNOSIS — D696 Thrombocytopenia, unspecified: Secondary | ICD-10-CM

## 2020-07-07 DIAGNOSIS — Z794 Long term (current) use of insulin: Secondary | ICD-10-CM

## 2020-07-07 DIAGNOSIS — G2581 Restless legs syndrome: Secondary | ICD-10-CM | POA: Diagnosis not present

## 2020-07-07 DIAGNOSIS — IMO0002 Reserved for concepts with insufficient information to code with codable children: Secondary | ICD-10-CM

## 2020-07-07 DIAGNOSIS — E1159 Type 2 diabetes mellitus with other circulatory complications: Secondary | ICD-10-CM | POA: Diagnosis not present

## 2020-07-07 DIAGNOSIS — E7800 Pure hypercholesterolemia, unspecified: Secondary | ICD-10-CM

## 2020-07-07 DIAGNOSIS — E1165 Type 2 diabetes mellitus with hyperglycemia: Secondary | ICD-10-CM | POA: Diagnosis not present

## 2020-07-07 DIAGNOSIS — I25118 Atherosclerotic heart disease of native coronary artery with other forms of angina pectoris: Secondary | ICD-10-CM | POA: Diagnosis not present

## 2020-07-07 NOTE — Progress Notes (Signed)
Careteam: Patient Care Team: [name], NP as PCP - General (Geriatric Medicine) [name], MD as Consulting Physician (Cardiology) Van Clines, MD as Consulting Physician (Neurology) [name], [name], OD (Optometry) [name], MD as Consulting Physician (Otolaryngology) [name], [name], RN as [facility] Care Management  PLACE OF SERVICE:  [facility]  Advanced Directive information Does Patient Have a Medical Advance Directive?: No, Would patient like information on creating a medical advance directive?: No - Patient declined  Allergies  Allergen Reactions  . Lyrica [Pregabalin] Other (See Comments)    Made patient very lethargic the next morning, very hard to patient to function, move, etc.   . Statins Other (See Comments)    Causes Restless Legs, rhabdomyolysis  . Trulicity [Dulaglutide] Other (See Comments)    GI side effects     Chief Complaint  Patient presents with  . Medical Management of Chronic Issues    4 month follow up. Discuss labs.  . Health Maintenance    Foot exam and PNA vaccine.     HPI: Patient is a 70 y.o. male follow up.   DM- struggling with blood sugars. Trying to correct but then will crash.  Blood sugar was 200 last night then 113 at 1 am.  He is now at 92.  He took 10 units novolog with breakfast.  Blood sugars up and down, avg over the last 30 days was 200.  A week ago he woke up in the middle of the night sweating. He took blood sugar it was 60. He tries not to over correct.  He is trying to eat more grilled chicken.  He is very active. Always working.  Feet will sting. Not sure if that is due to the RLS or diabetes.  RLS bad- taking 1 at 5 pm, 2 at bedtime.    Neuropathy- was taking 400 mg but unable to function on that does. Only taking 200 mg, still feels the effects but better.   Walking on his treadmill for 30 mins- going about 1.25 mile.   Hyperlipidemia- on praluent.   CAD/htn- continues on coreg,  lasix, valsartan No chest pains    Review of Systems:  Review of Systems  Constitutional: Negative for chills, fever and weight loss.  HENT: Negative for tinnitus.   Respiratory: Negative for cough, sputum production and shortness of breath.   Cardiovascular: Negative for chest pain, palpitations and leg swelling.  Gastrointestinal: Negative for abdominal pain, constipation, diarrhea and heartburn.  Genitourinary: Negative for dysuria, frequency and urgency.  Musculoskeletal: Negative for back pain, falls, joint pain and myalgias.  Skin: Negative.   Neurological: Positive for tingling. Negative for dizziness and headaches.  Psychiatric/Behavioral: Negative for depression and memory loss. The patient does not have insomnia.     Past Medical History:  Diagnosis Date  . Arthritis    "left thumb" (05/23/2017) right shoulder  . Barrett's esophagus    "we've been told that it's all gone; still take RX for GERD" (05/23/2017)  . Bilateral swelling of feet and ankles x 3 weeks as of 12-01-2019  . CAD (coronary artery disease), native coronary artery    Coronary angiogram 05/03/2014:  Proximal LAD 3.0x12 mm Promus premier stent.  04/08/2014: Mid Cx 3.5 x 16 mm Promus DES.  03/29/2013: Mid LAD 2.75 x 38 mm Promus Premier drug-eluting stent, balloon angioplasty of D1 and distal LAD and stenting of distal RCA with 2.75 x 24 mm promos Premier drug-eluting stent 12/15/2012 widely patent.  Marland Kitchen  Cervicalgia   . Chronic bronchitis (HCC)    "q yr" (05/23/2017)  . Complication of anesthesia    " I do not wake up very well "  . COVID-19 05/2019   all covid symptoms in hospital x 5 days, all symptoms resolved took monoclonal antibody tx   . Family history of adverse reaction to anesthesia    "son w/PONV"  . GERD (gastroesophageal reflux disease)   . Heart murmur    "grew out of it"   . Hyperlipidemia   . Hypoglycemia, unspecified   . 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 Impotence of  organic origin   . Iron deficiency anemia   . Other malaise and fatigue   . Pneumonia 05/2019  . PONV (postoperative nausea and vomiting)    after wisdom teeth pulled  . Restless leg   . Rotator cuff arthropathy   . Tension headache    "sometimes" (05/23/2017)  . Unspecified gastritis and gastroduodenitis with hemorrhage   . Unstable angina pectoris (HCC) 04/08/2014   Coronary angiogram 05/03/2014:  Proximal LAD 3.0x12 mm Promus premier stent.  04/08/2014: Mid Cx 3.5 x 16 mm Promus DES.  03/29/2013: Mid LAD 2.75 x 38 mm Promus Premier drug-eluting stent, balloon angioplasty of D1 and distal LAD and stenting of distal RCA with 2.75 x 24 mm promos Premier drug-eluting stent 12/15/2012 widely patent.   Past Surgical History:  Procedure Laterality Date  . BICEPT TENODESIS Right 12/07/2019   Procedure: RIGHT BICEPS TENODESIS;  Surgeon: Sheral Apley, MD;  Location: [facility];  Service: Orthopedics;  Laterality: Right;  . CARDIAC CATHETERIZATION N/A 09/25/2015   Procedure: Left Heart Cath and Coronary Angiography;  Surgeon: [name], MD;  Location: [facility] INVASIVE CV LAB;  Service: Cardiovascular;  Laterality: N/A;  . CARDIAC CATHETERIZATION N/A 09/25/2015   Procedure: Intravascular Pressure Wire/FFR Study;  Surgeon: [name], MD;  Location: [facility] INVASIVE CV LAB;  Service: Cardiovascular;  Laterality: N/A;  . CORONARY ANGIOPLASTY WITH STENT PLACEMENT  12/15/2012; 04/28/2014; 05/03/2014   "2; 1; 1" , 4 stents 2 balloons  . FRACTIONAL FLOW RESERVE WIRE Right 03/26/2013   Procedure: FRACTIONAL FLOW RESERVE WIRE;  Surgeon: Pamella Pert, MD;  Location: [facility] CATH LAB;  Service: Cardiovascular;  Laterality: Right;  . FRACTIONAL FLOW RESERVE WIRE N/A 05/03/2014   Procedure: FRACTIONAL FLOW RESERVE WIRE;  Surgeon: Pamella Pert, MD;  Location: [facility] CATH LAB;  Service: Cardiovascular;  Laterality: N/A;  . HERNIA REPAIR  2010   "umbilical"   . INCISION AND DRAINAGE ABSCESS Left 05/2007   "groin"    . KNEE SURGERY Right 1992;  2000   "calcium deposits removed" (12/15/2012)  . LEFT HEART CATH AND CORONARY ANGIOGRAPHY N/A 05/23/2017   Procedure: LEFT HEART CATH AND CORONARY ANGIOGRAPHY;  Surgeon: [name], MD;  Location: [facility] INVASIVE CV LAB;  Service: Cardiovascular;  Laterality: N/A;  . LEFT HEART CATHETERIZATION WITH CORONARY ANGIOGRAM N/A 12/15/2012   Procedure: LEFT HEART CATHETERIZATION WITH CORONARY ANGIOGRAM;  Surgeon: Pamella Pert, MD;  Location: [facility] CATH LAB;  Service: Cardiovascular;  Laterality: N/A;  . LEFT HEART CATHETERIZATION WITH CORONARY ANGIOGRAM N/A 03/26/2013   Procedure: LEFT HEART CATHETERIZATION WITH CORONARY ANGIOGRAM;  Surgeon: Pamella Pert, MD;  Location: [facility] CATH LAB;  Service: Cardiovascular;  Laterality: N/A;  . LEFT HEART CATHETERIZATION WITH CORONARY ANGIOGRAM N/A 04/08/2014   Procedure: LEFT HEART CATHETERIZATION WITH CORONARY ANGIOGRAM;  Surgeon: Micheline Chapman, MD;  Location: [facility] CATH  LAB;  Service: Cardiovascular;  Laterality: N/A;  . PERCUTANEOUS CORONARY STENT INTERVENTION (PCI-S) N/A 05/03/2014   Procedure: PERCUTANEOUS CORONARY STENT INTERVENTION (PCI-S);  Surgeon: Pamella Pert, MD;  Location: [facility] CATH LAB;  Service: Cardiovascular;  Laterality: N/A;  . ROTATOR CUFF REPAIR    . UMBILICAL HERNIA REPAIR  yrs ago   Social History:   reports that he has never smoked. He has never used smokeless tobacco. He reports that he does not drink alcohol and does not use drugs.  Family History  Adopted: Yes  Problem Relation Age of Onset  . Emphysema Mother     Medications: Patient's Medications  New Prescriptions   No medications on file  Previous Medications   ALBUTEROL (PROVENTIL HFA;VENTOLIN HFA) 108 (90 BASE) MCG/ACT INHALER    Inhale 2 puffs into the lungs every 6 (six) hours as needed for wheezing or shortness of breath.   ALIROCUMAB (PRALUENT) 150 MG/ML SOAJ    Inject 1 pen into the skin every 14 (fourteen) days.   ASPIRIN 81 MG EC TABLET     Take 1 tablet (81 mg total) by mouth daily.   BD PEN NEEDLE NANO 2ND GEN 32G X 4 MM MISC    USE AS DIRECTED   CARVEDILOL (COREG) 6.25 MG TABLET    Take 1 tablet (6.25 mg total) by mouth 2 (two) times daily with a meal.   CLINDAMYCIN (CLEOCIN) 300 MG CAPSULE    Take 300 mg by mouth 2 (two) times daily.   CONTINUOUS BLOOD GLUC RECEIVER (FREESTYLE LIBRE 14 DAY READER) DEVI    Inject 1 Device as directed daily as needed. Use once daily as direct to check blood sugar E11.51   CONTINUOUS BLOOD GLUC SENSOR (FREESTYLE LIBRE 14 DAY SENSOR) MISC    Use to test blood sugar three times daily. E11.51. E11.59   D-5000 125 MCG (5000 UT) TABS    TAKE 1 TABLET BY MOUTH EVERY DAY   FAMOTIDINE (PEPCID) 20 MG TABLET    TAKE 1 TABLET BY MOUTH EVERY DAY   FLUTICASONE (FLONASE) 50 MCG/ACT NASAL SPRAY    Place 1 spray into both nostrils daily as needed for allergies or rhinitis.   FUROSEMIDE (LASIX) 20 MG TABLET    Take 20 mg by mouth daily.   GABAPENTIN (NEURONTIN) 100 MG CAPSULE    Take Three capsules by mouth once daily at bedtime.   INSULIN GLARGINE (BASAGLAR KWIKPEN) 100 UNIT/ML    INJECT 40 UNITS SUBCUTANEOUSLY INTO THE SKIN AT BEDTIME   LIFESCAN FINEPOINT LANCETS MISC    Use to test for blood sugar three times daily dx: E11.51   METFORMIN (GLUCOPHAGE) 1000 MG TABLET    TAKE 1 TABLET BY MOUTH 2 TIMES DAILY WITH A MEAL   NITROGLYCERIN (NITROSTAT) 0.4 MG SL TABLET    PLACE 1 TABLET (0.4 MG TOTAL) UNDER THE TONGUE EVERY 5 (FIVE) MINUTES AS NEEDED FOR CHEST PAIN.   NOVOLOG FLEXPEN 100 UNIT/ML FLEXPEN    BASED ON CARBOHYDRATE INTAKE   ONETOUCH ULTRA TEST STRIP    USE TO TEST FOR BLOOD SUGAR THREE TIMES DAILY DX: E11.51   PANTOPRAZOLE (PROTONIX) 40 MG TABLET    Take one tablet by mouth once daily for stomach   PRAMIPEXOLE (MIRAPEX) 1 MG TABLET    TAKE 1 TABLET BY MOUTH AT 5PM, 1 TABLET BY MOUTH AT 7PM, AND TAKE 2 TABLETS BY MOUTH AT BEDTIME.   VALSARTAN (DIOVAN) 160 MG TABLET    Take 1 tablet (160 mg total) by  mouth  every evening.  Modified Medications   No medications on file  Discontinued Medications   No medications on file    Physical Exam:  Vitals:   07/07/20 0925  BP: 126/72  Pulse: 68  Temp: (!) 97.5 F (36.4 C)  TempSrc: Temporal  SpO2: 97%  Weight: 192 lb 9.6 oz (87.4 kg)  Height:  (1.727 m)   Body mass index is 29.28 kg/m. Wt Readings from Last 3 Encounters:  07/07/20 192 lb 9.6 oz (87.4 kg)  06/02/20 193 lb (87.5 kg)  03/06/20 196 lb (88.9 kg)    Physical Exam Constitutional:      General: He is not in acute distress.    Appearance: He is well-developed. He is not diaphoretic.  HENT:     Head: Normocephalic and atraumatic.     Mouth/Throat:     Pharynx: No oropharyngeal exudate.  Eyes:     Conjunctiva/sclera: Conjunctivae normal.     Pupils: Pupils are equal, round, and reactive to light.  Cardiovascular:     Rate and Rhythm: Normal rate and regular rhythm.     Heart sounds: Normal heart sounds.  Pulmonary:     Effort: Pulmonary effort is normal.     Breath sounds: Normal breath sounds.  Abdominal:     General: Bowel sounds are normal.     Palpations: Abdomen is soft.  Musculoskeletal:        General: No tenderness.     Cervical back: Normal range of motion and neck supple.  Skin:    General: Skin is warm and dry.  Neurological:     Mental Status: He is alert and oriented to person, place, and time.    Labs reviewed: Basic Metabolic Panel: Recent Labs    12/03/19 1338 12/07/19 0923 02/22/20 0805 06/30/20 0808  NA 137 137 135 137  K 4.2 4.5 4.8 5.2  CL 101 100 101 102  CO2 26  --  28 30  GLUCOSE 203* 235* 208* 152*  BUN CREATININE 1.03 1.00 1.07 1.25*  CALCIUM 9.1  --  9.4 9.7   Liver Function Tests: Recent Labs    10/18/19 0811 12/03/19 1338 06/30/20 0808  AST 20 34 25  ALT 30 47* 36  ALKPHOS  --  48  --   BILITOT 0.5 0.8 0.6  PROT 6.3 7.0 6.6  ALBUMIN  --  4.3  --    No results for input(s): LIPASE, AMYLASE in  the last 8760 hours. No results for input(s): AMMONIA in the last 8760 hours. CBC: Recent Labs    10/18/19 0811 12/03/19 1338 12/07/19 0923 02/22/20 0805 06/30/20 0808  WBC 4.4 5.2  --  5.0 5.8  NEUTROABS 2,776  --   --  3,145 3,985  HGB 14.1 14.8 13.9 14.5 15.0  HCT 41.3 43.4 41.0 42.4 44.3  MCV 87.1 89.1  --  86.0 86.0  PLT 118* 155  --  143 131*   Lipid Panel: Recent Labs    10/18/19 0811 06/30/20 0808  CHOL 155 104  HDL 38* 42  LDLCALC 87 37  TRIG 199* 173*  CHOLHDL 4.1 2.5   TSH: No results for input(s): TSH in the last 8760 hours. A1C: Lab Results  Component Value Date   HGBA1C 8.7 (H) 06/30/2020     Assessment/Plan 1. Uncontrolled diabetes mellitus with circulatory complication, with long-term current use of insulin (HCC) -not controlled. Having a hard time with managing despite dietary modifications.  Encouraged  to continue to work on dietary compliance, routine foot care/monitoring and to keep up with diabetic eye exams through ophthalmology  - Ambulatory referral to Endocrinology- for further evaluation and treatment.  2. Pure hypercholesterolemia LDL controlled on praluent. Continue dietary modifications  3. Coronary artery disease involving native coronary artery of native heart with other form of angina pectoris (HCC) Stable, no symptoms of chest on on coreg, lasix and valsartan.   4. Thrombocytopenia (HCC) Stable on recent labs.  5. Essential hypertension -controlled on valsartan, coreg and lasix  6. Restless leg syndrome -progressively worsening symptoms effecting QOL. On mirapex but symptoms not well controlled. - Ambulatory referral to Neurology for further evaluation and treatment.   Next appt: 6 months, - will get labs at time of visit.  Janene HarveyJessica K. Biagio BorgEubanks, AGNP  [facility] [phone]

## 2020-07-11 ENCOUNTER — Encounter: Payer: Self-pay | Admitting: Neurology

## 2020-07-14 ENCOUNTER — Encounter: Payer: Self-pay | Admitting: Cardiology

## 2020-07-14 ENCOUNTER — Ambulatory Visit: Payer: Medicare Other | Admitting: Cardiology

## 2020-07-14 ENCOUNTER — Other Ambulatory Visit: Payer: Self-pay

## 2020-07-14 VITALS — BP 136/83 | HR 65 | Temp 98.0°F | Resp 16 | Ht 68.0 in | Wt 194.0 lb

## 2020-07-14 DIAGNOSIS — I25118 Atherosclerotic heart disease of native coronary artery with other forms of angina pectoris: Secondary | ICD-10-CM

## 2020-07-14 DIAGNOSIS — I10 Essential (primary) hypertension: Secondary | ICD-10-CM

## 2020-07-14 DIAGNOSIS — E7800 Pure hypercholesterolemia, unspecified: Secondary | ICD-10-CM

## 2020-07-14 NOTE — Progress Notes (Signed)
Primary Physician/Referring:  [name], NP  Patient ID: Kenneth Sic Sr., male    DOB: 09/12/1950, 70 y.o.   MRN: [id number]  Chief Complaint  Patient presents with  . Hypertension  . Coronary Artery Disease  . Follow-up    6 weeks   HPI:    Kenneth Skelley Sr.  is a 70 y.o. caucasian male with coronary artery disease, last cardiac catheterization on 05/29/2017 when he presented with unstable anginal-like symptoms revealing widely patent stent to his LAD, RCA and circumflex and also balloon angioplasty site to D1 and distal LAD. Fortunately his last PCI has been in 2016.  Past medical history significant for hypertension, hyperlipidemia, GERD, Barrett's esophagus, statin intolerance, uncontrolled diabetes mellitus and also severe restless leg syndrome.  I had seen him 6 weeks ago and ordered lower extremity arterial duplex and also echocardiogram to follow-up on his leg cramps and PAD and also LV function.  He has no specific complaints today.   Past Medical History:  Diagnosis Date  . Arthritis    "left thumb" (05/23/2017) right shoulder  . Barrett's esophagus    "we've been told that it's all gone; still take RX for GERD" (05/23/2017)  . Bilateral swelling of feet and ankles x 3 weeks as of 12-01-2019  . CAD (coronary artery disease), native coronary artery    Coronary angiogram 05/03/2014:  Proximal LAD 3.0x12 mm Promus premier stent.  04/08/2014: Mid Cx 3.5 x 16 mm Promus DES.  03/29/2013: Mid LAD 2.75 x 38 mm Promus Premier drug-eluting stent, balloon angioplasty of D1 and distal LAD and stenting of distal RCA with 2.75 x 24 mm promos Premier drug-eluting stent 12/15/2012 widely patent.  . Cervicalgia   . Chronic bronchitis (HCC)    "q yr" (05/23/2017)  . Complication of anesthesia    " I do not wake up very well "  . COVID-19 05/2019   all covid symptoms in hospital x 5 days, all symptoms resolved took monoclonal antibody tx   . Family history of adverse reaction to anesthesia     "son w/PONV"  . GERD (gastroesophageal reflux disease)   . Heart murmur    "grew out of it"   . Hyperlipidemia   . Hypoglycemia, unspecified   . 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 Impotence of organic origin   . Iron deficiency anemia   . Other malaise and fatigue   . Pneumonia 05/2019  . PONV (postoperative nausea and vomiting)    after wisdom teeth pulled  . Restless leg   . Rotator cuff arthropathy   . Tension headache    "sometimes" (05/23/2017)  . Unspecified gastritis and gastroduodenitis with hemorrhage   . Unstable angina pectoris (HCC) 04/08/2014   Coronary angiogram 05/03/2014:  Proximal LAD 3.0x12 mm Promus premier stent.  04/08/2014: Mid Cx 3.5 x 16 mm Promus DES.  03/29/2013: Mid LAD 2.75 x 38 mm Promus Premier drug-eluting stent, balloon angioplasty of D1 and distal LAD and stenting of distal RCA with 2.75 x 24 mm promos Premier drug-eluting stent 12/15/2012 widely patent.   Past Surgical History:  Procedure Laterality Date  . BICEPT TENODESIS Right 12/07/2019   Procedure: RIGHT BICEPS TENODESIS;  Surgeon: Sheral Apley, MD;  Location: [facility];  Service: Orthopedics;  Laterality: Right;  . CARDIAC CATHETERIZATION N/A 09/25/2015   Procedure: Left Heart Cath and Coronary Angiography;  Surgeon: [name], MD;  Location: [facility] INVASIVE CV LAB;  Service: Cardiovascular;  Laterality: N/A;  . CARDIAC CATHETERIZATION N/A 09/25/2015   Procedure: Intravascular Pressure Wire/FFR Study;  Surgeon: [name], MD;  Location: [facility] INVASIVE CV LAB;  Service: Cardiovascular;  Laterality: N/A;  . CORONARY ANGIOPLASTY WITH STENT PLACEMENT  12/15/2012; 04/28/2014; 05/03/2014   "2; 1; 1" , 4 stents 2 balloons  . FRACTIONAL FLOW RESERVE WIRE Right 03/26/2013   Procedure: FRACTIONAL FLOW RESERVE WIRE;  Surgeon: Pamella Pert, MD;  Location: [facility] CATH LAB;  Service: Cardiovascular;  Laterality: Right;  . FRACTIONAL FLOW RESERVE WIRE N/A 05/03/2014   Procedure:  FRACTIONAL FLOW RESERVE WIRE;  Surgeon: Pamella Pert, MD;  Location: [facility] CATH LAB;  Service: Cardiovascular;  Laterality: N/A;  . HERNIA REPAIR  2010   "umbilical"   . INCISION AND DRAINAGE ABSCESS Left 05/2007   "groin"   . KNEE SURGERY Right 1992;  2000   "calcium deposits removed" (12/15/2012)  . LEFT HEART CATH AND CORONARY ANGIOGRAPHY N/A 05/23/2017   Procedure: LEFT HEART CATH AND CORONARY ANGIOGRAPHY;  Surgeon: [name], MD;  Location: [facility] INVASIVE CV LAB;  Service: Cardiovascular;  Laterality: N/A;  . LEFT HEART CATHETERIZATION WITH CORONARY ANGIOGRAM N/A 12/15/2012   Procedure: LEFT HEART CATHETERIZATION WITH CORONARY ANGIOGRAM;  Surgeon: Pamella Pert, MD;  Location: [facility] CATH LAB;  Service: Cardiovascular;  Laterality: N/A;  . LEFT HEART CATHETERIZATION WITH CORONARY ANGIOGRAM N/A 03/26/2013   Procedure: LEFT HEART CATHETERIZATION WITH CORONARY ANGIOGRAM;  Surgeon: Pamella Pert, MD;  Location: [facility] CATH LAB;  Service: Cardiovascular;  Laterality: N/A;  . LEFT HEART CATHETERIZATION WITH CORONARY ANGIOGRAM N/A 04/08/2014   Procedure: LEFT HEART CATHETERIZATION WITH CORONARY ANGIOGRAM;  Surgeon: Micheline Chapman, MD;  Location: [facility] CATH LAB;  Service: Cardiovascular;  Laterality: N/A;  . PERCUTANEOUS CORONARY STENT INTERVENTION (PCI-S) N/A 05/03/2014   Procedure: PERCUTANEOUS CORONARY STENT INTERVENTION (PCI-S);  Surgeon: Pamella Pert, MD;  Location: [facility] CATH LAB;  Service: Cardiovascular;  Laterality: N/A;  . ROTATOR CUFF REPAIR    . UMBILICAL HERNIA REPAIR  yrs ago   Social History   Tobacco Use  . Smoking status: Never Smoker  . Smokeless tobacco: Never Used  Substance Use Topics  . Alcohol use: No     ROS  Review of Systems  Constitutional: Negative for malaise/fatigue.  Cardiovascular: Positive for claudication. Negative for dyspnea on exertion and leg swelling.  Musculoskeletal: Positive for muscle cramps (restless leg).   Objective   Vitals with BMI 07/14/2020  07/14/2020 07/07/2020  Height - 5\' 8"  5\' 8"   Weight - 194 lbs 192 lbs 10 oz  BMI - 29.5 29.29  Systolic 136 142  Diastolic 83 75 72  Pulse 65 65 68    Blood pressure 136/83, pulse 65, temperature 98 F (36.7 C), temperature source Temporal, resp. rate 16, height 5\' 8"  (1.727 m), weight 194 lb (88 kg), SpO2 98 %. Body mass index is 29.5 kg/m.   Physical Exam Constitutional:      Appearance: He is well-developed.  Neck:     Vascular: No carotid bruit.  Cardiovascular:     Rate and Rhythm: Normal rate and regular rhythm.     Pulses:          Carotid pulses are 2+ on the right side and 2+ on the left side.      Femoral pulses are 2+ on the right side and 2+ on the left side.      Dorsalis pedis pulses are 0 on the right side and 0 on the  left side.       Posterior tibial pulses are 1+ on the right side and 0 on the left side.     Heart sounds: Murmur heard.   Blowing decrescendo early diastolic murmur is present at the upper right sternal border radiating to the apex. No gallop.      Comments: No edema.  There is no JVD. Pulmonary:     Effort: Pulmonary effort is normal.     Breath sounds: Normal breath sounds.  Abdominal:     General: Bowel sounds are normal.     Palpations: Abdomen is soft.  Musculoskeletal:        General: No swelling. Normal range of motion.    Radiology: No results found.  Laboratory examination:   Recent Labs    12/03/19 1338 12/07/19 0923 02/22/20 0805 06/30/20 0808  NA 137 137 135 137  K 4.2 4.5 4.8 5.2  CL 101 100 101 102  CO2 26  --  28 30  GLUCOSE 203* 235* 208* 152*  BUN 17 19 19 25   CREATININE 1.03 1.00 1.07 1.25*  CALCIUM 9.1  --  9.4 9.7  GFRNONAA >60  --  70 58*  GFRAA >60  --  81 67   CMP Latest Ref Rng & Units 06/30/2020 02/22/2020 12/07/2019  Glucose 65 - 99 mg/dL 161(W152(H) 960(A208(H) 540(J235(H)  BUN 7 - 25 mg/dL 25 19 19   Creatinine 0.70 - 1.18 mg/dL 8.11(B1.25(H) 1.471.07 8.291.00  Sodium 135 - 146 mmol/L 137 135 137  Potassium 3.5 - 5.3 mmol/L  5.2 4.8 4.5  Chloride 98 - 110 mmol/L 102 101 100  CO2 20 - 32 mmol/L 30 28 -  Calcium 8.6 - 10.3 mg/dL 9.7 9.4 -  Total Protein 6.1 - 8.1 g/dL 6.6 - -  Total Bilirubin 0.2 - 1.2 mg/dL 0.6 - -  Alkaline Phos 38 - 126 U/L - - -  AST 10 - 35 U/L 25 - -  ALT 9 - 46 U/L 36 - -   CBC Latest Ref Rng & Units 06/30/2020 02/22/2020 12/07/2019  WBC 3.8 - 10.8 Thousand/uL 5.8 5.0 -  Hemoglobin 13.2 - 17.1 g/dL 56.215.0 13.014.5 86.513.9  Hematocrit 38.5 - 50.0 % 44.3 42.4 41.0  Platelets 140 - 400 Thousand/uL 131(L) 143 -   Lipid Panel Recent Labs    10/18/19 0811 06/30/20 0808  CHOL 155 104  TRIG 199* 173*  LDLCALC 87 37  HDL 38* 42  CHOLHDL 4.1 2.5    HEMOGLOBIN H8IA1C Lab Results  Component Value Date   HGBA1C 8.7 (H) 06/30/2020   MPG 203 06/30/2020   TSH No results for input(s): TSH in the last 8760 hours. Medications and allergies   Allergies  Allergen Reactions  . Lyrica [Pregabalin] Other (See Comments)    Made patient very lethargic the next morning, very hard to patient to function, move, etc.   . Statins Other (See Comments)    Causes Restless Legs, rhabdomyolysis  . Trulicity [Dulaglutide] Other (See Comments)    GI side effects     Current Outpatient Medications on File Prior to Visit  Medication Sig Dispense Refill  . albuterol (PROVENTIL HFA;VENTOLIN HFA) 108 (90 Base) MCG/ACT inhaler Inhale 2 puffs into the lungs every 6 (six) hours as needed for wheezing or shortness of breath. 18 g 1  . Alirocumab (PRALUENT) 150 MG/ML SOAJ Inject 1 pen into the skin every 14 (fourteen) days. 6 mL 3  . aspirin 81 MG EC tablet Take 1 tablet (81  mg total) by mouth daily. 60 tablet 0  . BD PEN NEEDLE NANO 2ND GEN 32G X 4 MM MISC USE AS DIRECTED 100 each 3  . carvedilol (COREG) 6.25 MG tablet Take 1 tablet (6.25 mg total) by mouth 2 (two) times daily with a meal. 180 tablet 2  . clindamycin (CLEOCIN) 300 MG capsule Take 300 mg by mouth 2 (two) times daily.    . Continuous Blood Gluc Receiver  (FREESTYLE LIBRE 14 DAY READER) DEVI Inject 1 Device as directed daily as needed. Use once daily as direct to check blood sugar E11.51 1 each 0  . Continuous Blood Gluc Sensor (FREESTYLE LIBRE 14 DAY SENSOR) MISC Use to test blood sugar three times daily. E11.51. E11.59 2 each 12  . D-5000 125 MCG (5000 UT) TABS TAKE 1 TABLET BY MOUTH EVERY DAY 90 tablet 1  . famotidine (PEPCID) 20 MG tablet TAKE 1 TABLET BY MOUTH EVERY DAY 30 tablet 1  . fluticasone (FLONASE) 50 MCG/ACT nasal spray Place 1 spray into both nostrils daily as needed for allergies or rhinitis.    . furosemide (LASIX) 20 MG tablet Take 20 mg by mouth daily.    Marland Kitchen gabapentin (NEURONTIN) 100 MG capsule Take 200 mg by mouth at bedtime.    . Insulin Glargine (BASAGLAR KWIKPEN) 100 UNIT/ML INJECT 40 UNITS SUBCUTANEOUSLY INTO THE SKIN AT BEDTIME 45 mL 4  . LIFESCAN FINEPOINT LANCETS MISC Use to test for blood sugar three times daily dx: E11.51 300 each 1  . metFORMIN (GLUCOPHAGE) 1000 MG tablet TAKE 1 TABLET BY MOUTH 2 TIMES DAILY WITH A MEAL 180 tablet 0  . nitroGLYCERIN (NITROSTAT) 0.4 MG SL tablet PLACE 1 TABLET (0.4 MG TOTAL) UNDER THE TONGUE EVERY 5 (FIVE) MINUTES AS NEEDED FOR CHEST PAIN. 50 tablet 0  . NOVOLOG FLEXPEN 100 UNIT/ML FlexPen BASED ON CARBOHYDRATE INTAKE 15 mL 11  . ONETOUCH ULTRA test strip USE TO TEST FOR BLOOD SUGAR THREE TIMES DAILY DX: E11.51 300 strip 11  . pantoprazole (PROTONIX) 40 MG tablet Take one tablet by mouth once daily for stomach 90 tablet 1  . pramipexole (MIRAPEX) 1 MG tablet TAKE 1 TABLET BY MOUTH AT 5PM, 1 TABLET BY MOUTH AT 7PM, AND TAKE 2 TABLETS BY MOUTH AT BEDTIME. 360 tablet 1  . valsartan (DIOVAN) 160 MG tablet Take 1 tablet (160 mg total) by mouth every evening. 90 tablet 3   No current facility-administered medications on file prior to visit.    Cardiac Studies:   Coronary angiogram 09/25/2015: Ostial Cx 50-60%, FFR 0.86. LVEF 60%. Patent stent placed 05/03/2014: Proximal LAD 3.0x12 mm Promus  DES. 04/08/2014: Cx/OM1 with 3.5 x 16 mm Promus DES. H/O Mid LAD 2.75 x 38 mm Promus Premier DES, balloon angioplasty of D1 and distal LAD(03/29/2013) and Distal RCA stent (12/15/2012) with 2.75 x 24 mm promos Premier DES.  Lower extremity venous duplex for insufficiency 05/29/2017: Bilateral lower extremities negative for DVT or reflux.  Coronary angiogram 05/30/2019 and 05/23/2017: All stents placed previously are patent.  Proximal to mid LAD long stent (Overlaps prior stent) that was placed on 04/08/2014, circumflex/obtuse marginal-1 stenting. History of mid LAD stenting and balloon angioplasty to D1 and distal LAD performed on 03/29/2013, distal RCA stent in 2014. No change in ostial circumflex lesion, the mid LAD stent is widely patent however at the outflow, there is a 30-40% stenosis.  Lexiscan Tetrofosmin stress test 05/31/2019: No previous exam available for comparison. Lexiscan nuclear stress test performed using 1-day protocol. Stress  EKG is non-diagnostic, as this is pharmacological stress test. In addition, stress EKG at 85% MPHR shows sinus tachycardia, no ischemic changes.  Myocardial perfusion imaging is normal. Left ventricular ejection fraction is  71% with normal wall motion.  Low risk study.  Lower Extremity Arterial Duplex 06/22/2020: No hemodynamically significant stenoses are identified in the right or left lower extremity arterial system. Mildly  abnormal biphasic waveform noted throughout the lower extremity. The right and left mid posterior tibial and mid anterior tibial arteries are non-compressible due to medial arteriosclerosis.   Echocardiogram 06/22/2020: Normal LV systolic function with visual EF 60-65%. Left ventricle cavity is normal in size. Mild left ventricular hypertrophy. Normal global wall motion. Normal diastolic filling pattern, normal LAP.  Mild (Grade I) aortic regurgitation. Mild (Grade I) mitral regurgitation. Mild tricuspid regurgitation. No evidence of  pulmonary hypertension. Prior study dated 06/08/2019: LVEF 57%, Grade I DD, Moderate AR, Mild to moderate TR, Mild PR, PASP .   EKG:    EKG 06/02/2020: Normal sinus rhythm at rate of 71 bpm, left axis deviation, left anterior fascicular block.  Incomplete right bundle branch block.  Poor R wave progression, cannot exclude anteroseptal infarct old.  Compared to 12/01/2019, leftward axis is new.     Assessment     ICD-10-CM   1. Coronary artery disease of native artery of native heart with stable angina pectoris (HCC)  I25.118   2. Essential hypertension  I10   3. Pure hypercholesterolemia  E78.00    No orders of the defined types were placed in this encounter.   Medications Discontinued During This Encounter  Medication Reason  . gabapentin (NEURONTIN) 100 MG capsule Error    Recommendations:   Stepfon Rawles Sr.  is a 57 y.o. caucasian male with coronary artery disease, last cardiac catheterization on 05/29/2017 when he presented with unstable anginal-like symptoms revealing widely patent stent to his LAD, RCA and circumflex and also balloon angioplasty site to D1 and distal LAD.  Fortunately his last PCI has been in 2016.  Past medical history significant for hypertension, hyperlipidemia, GERD, Barrett's esophagus, statin intolerance, uncontrolled diabetes mellitus and also severe restless leg syndrome.  He also had florid COVID 19 infection in February 2021.  With regard to coronary disease he has not had any recurrence of angina pectoris.  On his last office visit I had restarted him back on carvedilol 6.25 mg twice daily and also valsartan dose was increased from 80 mg to 160 mg as all these were discontinued during COVID-19 infection and low blood pressure.  He is tolerating the medication without side effect.  His potassium level is in the upper limit of normal, I have discussed extensively with the patient regarding avoidance of high potassium foods.  Also advised him to hold taking  valsartan if he is acutely ill.  Otherwise blood pressure is well controlled, advised him as his blood pressure is minimally elevated at 136, to keep an eye on this and monitor blood pressure at home and goal blood pressure 130/80 mmHg.  External labs reviewed, lipids under good control, elevated triglycerides related to his poor diet and uncontrolled diabetes.  Again reinforced diet and avoidance of desserts especially ice cream.  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  Vascular Dopplers were within normal limits.  Echocardiogram reveals preserved LVEF.  I will see him back on an annual basis.  25-minute office visit encounter.  [name], MD, [facility] 07/14/2020, 12:49 PM Office: [phone]

## 2020-07-20 ENCOUNTER — Other Ambulatory Visit: Payer: Self-pay | Admitting: *Deleted

## 2020-07-20 ENCOUNTER — Other Ambulatory Visit: Payer: Self-pay | Admitting: Nurse Practitioner

## 2020-07-20 DIAGNOSIS — IMO0002 Reserved for concepts with insufficient information to code with codable children: Secondary | ICD-10-CM

## 2020-07-20 DIAGNOSIS — E1165 Type 2 diabetes mellitus with hyperglycemia: Secondary | ICD-10-CM

## 2020-07-20 MED ORDER — BASAGLAR KWIKPEN 100 UNIT/ML ~~LOC~~ SOPN
PEN_INJECTOR | SUBCUTANEOUS | 4 refills | Status: DC
Start: 2020-07-20 — End: 2020-07-28

## 2020-07-20 NOTE — Telephone Encounter (Signed)
Patient requested refill

## 2020-07-28 ENCOUNTER — Other Ambulatory Visit: Payer: Self-pay | Admitting: Orthopedic Surgery

## 2020-07-28 ENCOUNTER — Telehealth: Payer: Self-pay | Admitting: *Deleted

## 2020-07-28 DIAGNOSIS — IMO0002 Reserved for concepts with insufficient information to code with codable children: Secondary | ICD-10-CM

## 2020-07-28 DIAGNOSIS — E1159 Type 2 diabetes mellitus with other circulatory complications: Secondary | ICD-10-CM

## 2020-07-28 MED ORDER — NOVOLOG FLEXPEN 100 UNIT/ML ~~LOC~~ SOPN: PEN_INJECTOR | SUBCUTANEOUS | 11 refills | Status: DC

## 2020-07-28 MED ORDER — GLUCOSE 4 G PO CHEW: 1.0000 | CHEWABLE_TABLET | ORAL | 12 refills | Status: AC | PRN

## 2020-07-28 MED ORDER — BASAGLAR KWIKPEN 100 UNIT/ML ~~LOC~~ SOPN: PEN_INJECTOR | SUBCUTANEOUS | 4 refills | Status: DC

## 2020-07-28 NOTE — Telephone Encounter (Signed)
OV directions appropriate- thanks. Can we check to see if he has glucose tabs?

## 2020-07-28 NOTE — Telephone Encounter (Signed)
Received fax from pharmacy stating that they need "max daily dose" for Novolog Flexpen  Sig states: Based on Carbohydrate intake.   Is it ok to add the last OV note directions of:   DM- struggling with blood sugars. Trying to correct but then will crash.  Blood sugar was 200 last night then 113 at 1 am.  He is now at 92.  He took 10 units novolog with breakfast.  Blood sugars up and down, avg over the last 30 days was 200.  A week ago he woke up in the middle of the night sweating. He took blood sugar it was 60. He tries not to over correct.  He is trying to eat more grilled chicken.  He is very active. Always working.  Feet will sting. Not sure if that is due to the RLS or diabetes.  RLS bad- taking 1 at 5 pm, 2 at bedtime   Please Advise. (forwarded to Amy due to Shanda Bumps out of office)

## 2020-07-28 NOTE — Telephone Encounter (Signed)
Forwarded to Amy to correct Sig. Max Dosing per pharmacy request.

## 2020-07-28 NOTE — Telephone Encounter (Signed)
Patient stated that he does have Glucose Tablets.

## 2020-07-28 NOTE — Telephone Encounter (Signed)
Received Prior Authorization for [employer].  Initiated Prior Authorization through [employer] into Determination.   Patient has tried and failed Toujeo in 2018. (Non Formulary Drugs Lantus, Nathen Shankar Silber Evaristo Bury or Levimir)  Awaiting Determination through [employer]: [id number] PA Case #: [id number]  Medication was APPROVED

## 2020-07-28 NOTE — Telephone Encounter (Signed)
New prescriptions sent.

## 2020-08-01 ENCOUNTER — Other Ambulatory Visit: Payer: Self-pay

## 2020-08-01 ENCOUNTER — Encounter: Payer: Self-pay | Admitting: Nurse Practitioner

## 2020-08-01 ENCOUNTER — Ambulatory Visit (INDEPENDENT_AMBULATORY_CARE_PROVIDER_SITE_OTHER): Payer: Medicare Other | Admitting: Nurse Practitioner

## 2020-08-01 VITALS — BP 140/90 | HR 75 | Temp 97.3°F

## 2020-08-01 DIAGNOSIS — I25118 Atherosclerotic heart disease of native coronary artery with other forms of angina pectoris: Secondary | ICD-10-CM

## 2020-08-01 DIAGNOSIS — J069 Acute upper respiratory infection, unspecified: Secondary | ICD-10-CM

## 2020-08-01 NOTE — Progress Notes (Signed)
Careteam: Patient Care Team: [name], NP as PCP - General (Geriatric Medicine) [name], MD as Consulting Physician (Cardiology) Van Clines, MD as Consulting Physician (Neurology) [name], [name], OD (Optometry) [name], MD as Consulting Physician (Otolaryngology) [name], [name], RN as [facility] Care Management  PLACE OF SERVICE:  [facility]  Advanced Directive information    Allergies  Allergen Reactions  . Lyrica [Pregabalin] Other (See Comments)    Made patient very lethargic the next morning, very hard to patient to function, move, etc.   . Statins Other (See Comments)    Causes Restless Legs, rhabdomyolysis  . Trulicity [Dulaglutide] Other (See Comments)    GI side effects     Chief Complaint  Patient presents with  . Acute Visit    Coughing , sore throat and congestion. Patient is coughing up yellow phlegm.     HPI: Patient is a 70 y.o. male due to cough and congestion for 3 days. Also reports sore throat.  Reports congestion in the chest.  Overall does not feel that bad No known sick contacts.  Reports he does occasionally have allergies. Will get bronchitis when the season change.  No fever or chills or body aches. Reports runny nose.  Watery drainage.  Coughing up phlegm.  Has not taken anything for it.   Had COVID January 2021.  Review of Systems:  Review of Systems  Constitutional: Negative for chills, fever, malaise/fatigue and weight loss.  HENT: Positive for congestion and sore throat. Negative for tinnitus.   Respiratory: Positive for cough and sputum production. Negative for shortness of breath.   Cardiovascular: Negative for chest pain, palpitations and leg swelling.  Gastrointestinal: Negative for abdominal pain, constipation, diarrhea and heartburn.  Genitourinary: Negative for dysuria, frequency and urgency.  Musculoskeletal: Negative for myalgias.  Skin: Negative.   Neurological: Negative for  dizziness and headaches.  Psychiatric/Behavioral: Negative for depression and memory loss. The patient does not have insomnia.     Past Medical History:  Diagnosis Date  . Arthritis    "left thumb" (05/23/2017) right shoulder  . Barrett's esophagus    "we've been told that it's all gone; still take RX for GERD" (05/23/2017)  . Bilateral swelling of feet and ankles x 3 weeks as of 12-01-2019  . CAD (coronary artery disease), native coronary artery    Coronary angiogram 05/03/2014:  Proximal LAD 3.0x12 mm Promus premier stent.  04/08/2014: Mid Cx 3.5 x 16 mm Promus DES.  03/29/2013: Mid LAD 2.75 x 38 mm Promus Premier drug-eluting stent, balloon angioplasty of D1 and distal LAD and stenting of distal RCA with 2.75 x 24 mm promos Premier drug-eluting stent 12/15/2012 widely patent.  . Cervicalgia   . Chronic bronchitis (HCC)    "q yr" (05/23/2017)  . Complication of anesthesia    " I do not wake up very well "  . COVID-19 05/2019   all covid symptoms in hospital x 5 days, all symptoms resolved took monoclonal antibody tx   . Family history of adverse reaction to anesthesia    "son w/PONV"  . GERD (gastroesophageal reflux disease)   . Heart murmur    "grew out of it"   . Hyperlipidemia   . Hypoglycemia, unspecified   . 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 Impotence of organic origin   . Iron deficiency anemia   . Other malaise and fatigue   . Pneumonia 05/2019  . PONV (postoperative nausea  and vomiting)    after wisdom teeth pulled  . Restless leg   . Rotator cuff arthropathy   . Tension headache    "sometimes" (05/23/2017)  . Unspecified gastritis and gastroduodenitis with hemorrhage   . Unstable angina pectoris (HCC) 04/08/2014   Coronary angiogram 05/03/2014:  Proximal LAD 3.0x12 mm Promus premier stent.  04/08/2014: Mid Cx 3.5 x 16 mm Promus DES.  03/29/2013: Mid LAD 2.75 x 38 mm Promus Premier drug-eluting stent, balloon angioplasty of D1 and distal LAD and stenting of  distal RCA with 2.75 x 24 mm promos Premier drug-eluting stent 12/15/2012 widely patent.   Past Surgical History:  Procedure Laterality Date  . BICEPT TENODESIS Right 12/07/2019   Procedure: RIGHT BICEPS TENODESIS;  Surgeon: Sheral Apley, MD;  Location: [facility];  Service: Orthopedics;  Laterality: Right;  . CARDIAC CATHETERIZATION N/A 09/25/2015   Procedure: Left Heart Cath and Coronary Angiography;  Surgeon: [name], MD;  Location: [facility] INVASIVE CV LAB;  Service: Cardiovascular;  Laterality: N/A;  . CARDIAC CATHETERIZATION N/A 09/25/2015   Procedure: Intravascular Pressure Wire/FFR Study;  Surgeon: [name], MD;  Location: [facility] INVASIVE CV LAB;  Service: Cardiovascular;  Laterality: N/A;  . CORONARY ANGIOPLASTY WITH STENT PLACEMENT  12/15/2012; 04/28/2014; 05/03/2014   "2; 1; 1" , 4 stents 2 balloons  . FRACTIONAL FLOW RESERVE WIRE Right 03/26/2013   Procedure: FRACTIONAL FLOW RESERVE WIRE;  Surgeon: Pamella Pert, MD;  Location: [facility] CATH LAB;  Service: Cardiovascular;  Laterality: Right;  . FRACTIONAL FLOW RESERVE WIRE N/A 05/03/2014   Procedure: FRACTIONAL FLOW RESERVE WIRE;  Surgeon: Pamella Pert, MD;  Location: [facility] CATH LAB;  Service: Cardiovascular;  Laterality: N/A;  . HERNIA REPAIR  2010   "umbilical"   . INCISION AND DRAINAGE ABSCESS Left 05/2007   "groin"   . KNEE SURGERY Right 1992;  2000   "calcium deposits removed" (12/15/2012)  . LEFT HEART CATH AND CORONARY ANGIOGRAPHY N/A 05/23/2017   Procedure: LEFT HEART CATH AND CORONARY ANGIOGRAPHY;  Surgeon: [name], MD;  Location: [facility] INVASIVE CV LAB;  Service: Cardiovascular;  Laterality: N/A;  . LEFT HEART CATHETERIZATION WITH CORONARY ANGIOGRAM N/A 12/15/2012   Procedure: LEFT HEART CATHETERIZATION WITH CORONARY ANGIOGRAM;  Surgeon: Pamella Pert, MD;  Location: [facility] CATH LAB;  Service: Cardiovascular;  Laterality: N/A;  . LEFT HEART CATHETERIZATION WITH CORONARY ANGIOGRAM N/A 03/26/2013   Procedure: LEFT  HEART CATHETERIZATION WITH CORONARY ANGIOGRAM;  Surgeon: Pamella Pert, MD;  Location: [facility] CATH LAB;  Service: Cardiovascular;  Laterality: N/A;  . LEFT HEART CATHETERIZATION WITH CORONARY ANGIOGRAM N/A 04/08/2014   Procedure: LEFT HEART CATHETERIZATION WITH CORONARY ANGIOGRAM;  Surgeon: Micheline Chapman, MD;  Location: [facility] CATH LAB;  Service: Cardiovascular;  Laterality: N/A;  . PERCUTANEOUS CORONARY STENT INTERVENTION (PCI-S) N/A 05/03/2014   Procedure: PERCUTANEOUS CORONARY STENT INTERVENTION (PCI-S);  Surgeon: Pamella Pert, MD;  Location: [facility] CATH LAB;  Service: Cardiovascular;  Laterality: N/A;  . ROTATOR CUFF REPAIR    . UMBILICAL HERNIA REPAIR  yrs ago   Social History:   reports that he has never smoked. He has never used smokeless tobacco. He reports that he does not drink alcohol and does not use drugs.  Family History  Adopted: Yes  Problem Relation Age of Onset  . Emphysema Mother     Medications: Patient's Medications  New Prescriptions   No medications on file  Previous Medications   ALBUTEROL (PROVENTIL HFA;VENTOLIN HFA) 108 (90 BASE) MCG/ACT INHALER  Inhale 2 puffs into the lungs every 6 (six) hours as needed for wheezing or shortness of breath.   ALIROCUMAB (PRALUENT) 150 MG/ML SOAJ    Inject 1 pen into the skin every 14 (fourteen) days.   ASPIRIN 81 MG EC TABLET    Take 1 tablet (81 mg total) by mouth daily.   BD PEN NEEDLE NANO 2ND GEN 32G X 4 MM MISC    USE AS DIRECTED   CARVEDILOL (COREG) 6.25 MG TABLET    Take 1 tablet (6.25 mg total) by mouth 2 (two) times daily with a meal.   CONTINUOUS BLOOD GLUC RECEIVER (FREESTYLE LIBRE 14 DAY READER) DEVI    Inject 1 Device as directed daily as needed. Use once daily as direct to check blood sugar E11.51   CONTINUOUS BLOOD GLUC SENSOR (FREESTYLE LIBRE 14 DAY SENSOR) MISC    Use to test blood sugar three times daily. E11.51. E11.59   D-5000 125 MCG (5000 UT) TABS    TAKE 1 TABLET BY MOUTH EVERY DAY   FAMOTIDINE (PEPCID) 20  MG TABLET    TAKE 1 TABLET BY MOUTH EVERY DAY   FLUTICASONE (FLONASE) 50 MCG/ACT NASAL SPRAY    Place 1 spray into both nostrils daily as needed for allergies or rhinitis.   GABAPENTIN (NEURONTIN) 100 MG CAPSULE    Take 200 mg by mouth at bedtime.   GLUCOSE 4 GM CHEWABLE TABLET    Chew 1 tablet (4 g total) by mouth as needed for low blood sugar.   INSULIN ASPART (NOVOLOG FLEXPEN) 100 UNIT/ML FLEXPEN    Administer 10 units with breakfast if 45-60 grams of carbohydrates consumed with meal.   INSULIN GLARGINE (BASAGLAR KWIKPEN) 100 UNIT/ML    Inject 40 units subcutaneously into the skin at bedtime   LIFESCAN FINEPOINT LANCETS MISC    Use to test for blood sugar three times daily dx: E11.51   METFORMIN (GLUCOPHAGE) 1000 MG TABLET    TAKE 1 TABLET BY MOUTH 2 TIMES DAILY WITH A MEAL   NITROGLYCERIN (NITROSTAT) 0.4 MG SL TABLET    PLACE 1 TABLET (0.4 MG TOTAL) UNDER THE TONGUE EVERY 5 (FIVE) MINUTES AS NEEDED FOR CHEST PAIN.   ONETOUCH ULTRA TEST STRIP    USE TO TEST FOR BLOOD SUGAR THREE TIMES DAILY DX: E11.51   PANTOPRAZOLE (PROTONIX) 40 MG TABLET    Take one tablet by mouth once daily for stomach   PRAMIPEXOLE (MIRAPEX) 1 MG TABLET    TAKE 1 TABLET BY MOUTH AT 5PM, 1 TABLET BY MOUTH AT 7PM, AND TAKE 2 TABLETS BY MOUTH AT BEDTIME.   VALSARTAN (DIOVAN) 160 MG TABLET    Take 1 tablet (160 mg total) by mouth every evening.  Modified Medications   No medications on file  Discontinued Medications   CLINDAMYCIN (CLEOCIN) 300 MG CAPSULE    Take 300 mg by mouth 2 (two) times daily.   FUROSEMIDE (LASIX) 20 MG TABLET    Take 20 mg by mouth daily.    Physical Exam:  There were no vitals filed for this visit. There is no height or weight on file to calculate BMI. Wt Readings from Last 3 Encounters:  07/14/20 194 lb (88 kg)  07/07/20 192 lb 9.6 oz (87.4 kg)  06/02/20 193 lb (87.5 kg)    Physical Exam Constitutional:      General: He is not in acute distress.    Appearance: He is well-developed. He is  not diaphoretic.  HENT:     Head:  Normocephalic and atraumatic.     Mouth/Throat:     Pharynx: No oropharyngeal exudate.  Eyes:     Conjunctiva/sclera: Conjunctivae normal.     Pupils: Pupils are equal, round, and reactive to light.  Cardiovascular:     Rate and Rhythm: Normal rate and regular rhythm.     Heart sounds: Normal heart sounds.  Pulmonary:     Effort: Pulmonary effort is normal.     Breath sounds: Normal breath sounds.  Abdominal:     General: Bowel sounds are normal.     Palpations: Abdomen is soft.  Musculoskeletal:        General: No tenderness.     Cervical back: Normal range of motion and neck supple.  Skin:    General: Skin is warm and dry.  Neurological:     Mental Status: He is alert and oriented to person, place, and time.     Labs reviewed: Basic Metabolic Panel: Recent Labs    12/03/19 1338 12/07/19 0923 02/22/20 0805 06/30/20 0808  NA 137 137 135 137  K 4.2 4.5 4.8 5.2  CL 101 100 101 102  CO2 26  --  28 30  GLUCOSE 203* 235* 208* 152*  BUN 17 19 19 25   CREATININE 1.03 1.00 1.07 1.25*  CALCIUM 9.1  --  9.4 9.7   Liver Function Tests: Recent Labs    10/18/19 0811 12/03/19 1338 06/30/20 0808  AST 20 34 25  ALT 30 47* 36  ALKPHOS  --  48  --   BILITOT 0.5 0.8 0.6  PROT 6.3 7.0 6.6  ALBUMIN  --  4.3  --    No results for input(s): LIPASE, AMYLASE in the last 8760 hours. No results for input(s): AMMONIA in the last 8760 hours. CBC: Recent Labs    10/18/19 0811 12/03/19 1338 12/07/19 0923 02/22/20 0805 06/30/20 0808  WBC 4.4 5.2  --  5.0 5.8  NEUTROABS 2,776  --   --  3,145 3,985  HGB 14.1 14.8 13.9 14.5 15.0  HCT 41.3 43.4 41.0 42.4 44.3  MCV 87.1 89.1  --  86.0 86.0  PLT 118* 155  --  143 131*   Lipid Panel: Recent Labs    10/18/19 0811 06/30/20 0808  CHOL 155 104  HDL 38* 42  LDLCALC 87 37  TRIG 199* 173*  CHOLHDL 4.1 2.5   TSH: No results for input(s): TSH in the last 8760 hours. A1C: Lab Results  Component  Value Date   HGBA1C 8.7 (H) 06/30/2020     Assessment/Plan 1. Upper respiratory infection with cough and congestion -3 days of symptoms, recommending supportive care at this time -increase hydration -plain nasal saline to help with nasal congestion Avoid blowing nose. Continue flonase and allergy medication.  -recommended to take Vit C 1000 mg daily, Vit D 5000 units daily, zinc 50 mg daily for 14 days -mucinex DM twice daily as needed for chest congestion Tylenol 500 mg 1-2 tablets every 8 hours as needed for sore throat, fever.  - SARS-COV-2 RNA,(COVID-19) QUAL NAAT to quarantine until results are back.  -education provided on when to follow up and when to seek immediate medication attention through the ED.  07/02/2020. Janene Harvey  [facility] [phone]

## 2020-08-01 NOTE — Patient Instructions (Addendum)
-plain nasal saline to help with nasal congestion Avoid blowing nose. Continue flonase and allergy medication.  -make sure you are staying well hydrated -recommended to take Vit C 1000 mg daily, Vit D 5000 units daily, zinc 50 mg daily for 14 days -mucinex DM twice daily as needed for chest congestion -education provided on when to follow up and when to seek immediate medication attention through the ED.   Tylenol 500 mg 1-2 tablets every 8 hours as needed for sore throat, fever.    3 Key Steps to Take While Waiting for Your COVID-19 Test Result To help stop the spread of COVID-19, take these 3 key steps NOW while waiting for your test results: 1. Stay home and monitor your health. Stay home and monitor your health to help protect your friends, family, and others from possibly getting COVID-19 from you. Stay home and away from others:  If possible, stay away from others, especially people who are at higher risk for getting very sick from COVID-19, such as older adults and people with other medical conditions.  If you have been in contact with someone with COVID-19, stay home and away from others for 14 days after your last contact with that person. Follow the recommendations of your local public health department if you need to quarantine.  If you have a fever, cough or other symptoms of COVID-19, stay home and away from others (except to get medical care). Monitor your health:  Watch for fever, cough, shortness of breath, or other symptoms of COVID-19. Remember, symptoms may appear 2-14 days after exposure to COVID-19 and can include: ? Fever or chills ? Cough ? Shortness of breath or difficulty breathing ? Tiredness ? Muscle or body aches ? Headache ? New loss of taste or smell ? Sore throat ? Congestion or runny nose ? Nausea or vomiting ? Diarrhea 2. Think about the people you have recently been around. If you are diagnosed with COVID-19, a public health worker may call you  to check on your health, discuss who you have been around, and ask where you spent time while you may have been able to spread COVID-19 to others. While you wait for your COVID-19 test result, think about everyone you have been around recently. This will be important information to give health workers if your test is positive.  Complete the information on the back of this page to help you remember everyone you have been around.  3. Answer the phone call from the health department. If a public health worker calls you, answer the call to help slow the spread of COVID-19 in your community.  Discussions with health department staff are confidential. This means that your personal and medical information will be kept private and only shared with those who may need to know, like your health care provider.  Your name will not be shared with those you came in contact with. The health department will only notify people you were in close contact with (within 6 feet for more than 15 minutes) that they might have been exposed to COVID-19. Think about the people you have recently been around If you test positive and are diagnosed with COVID-19, someone from the health department may call to check-in on your health, discuss who you have been around, and ask where you spent time while you may have been able to spread COVID-19 to others. This form can help you think about people you have recently been around so you will be ready if  a IT consultant calls you. Things to think about. Have you:  Gone to work or school?  Gotten together with others (eaten out at [employer], gone out for drinks, exercised with others or gone to a gym, had friends or family over to your house, volunteered, gone to a party, pool, or park)?  Gone to a store in person (e.g., grocery store, mall)?  Gone to in-person appointments (e.g., salon, barber, doctor's or dentist's office)?  Ridden in a car with others (e.g., rideshare) or  taken public transportation?  Been inside a church, synagogue, mosque or other places of worship? Who lives with you?  ______________________________________________________________________  ______________________________________________________________________  ______________________________________________________________________  ______________________________________________________________________ Who have you been around (less than 6 feet for a total of 15 minutes or more) in the last 10 days? (You may have more people to list than the space provided. If so, write on the front of this sheet or a separate piece of paper.) Name ______________________________________________  Phone number ____________________________________  Date you last saw them _____________________________  Where you last saw them ________________________________________________ Name ______________________________________________  Phone number ____________________________________  Date you last saw them _____________________________  Where you last saw them ________________________________________________ Name ______________________________________________  Phone number ____________________________________  Date you last saw them _____________________________  Where you last saw them ________________________________________________ Name ______________________________________________  Phone number ____________________________________  Date you last saw them _____________________________  Where you last saw them ________________________________________________ Name ______________________________________________  Phone number ____________________________________  Date you last saw them _____________________________  Where you last saw them ________________________________________________ What have you done in the last 10 days with other people? Activity  _____________________________________________  Location _________________________________________  Date ____________________________________________ Activity _____________________________________________  Location _________________________________________  Date ____________________________________________ Activity _____________________________________________  Location _________________________________________  Date ____________________________________________ Activity _____________________________________________  Location _________________________________________  Date ____________________________________________ Activity _____________________________________________  Location _________________________________________  Date ____________________________________________ Centers for Disease Control and Prevention [email or web address] 09/10/2019 This information is not intended to replace advice given to you by your health care provider. Make sure you discuss any questions you have with your health care provider. Document Revised: 01/03/2020 Document Reviewed: 01/03/2020 Elsevier Patient Education  2021 [employer].

## 2020-08-03 ENCOUNTER — Other Ambulatory Visit: Payer: Self-pay | Admitting: Nurse Practitioner

## 2020-08-03 DIAGNOSIS — J069 Acute upper respiratory infection, unspecified: Secondary | ICD-10-CM

## 2020-08-03 DIAGNOSIS — H1089 Other conjunctivitis: Secondary | ICD-10-CM | POA: Diagnosis not present

## 2020-08-03 LAB — SARS-COV-2 RNA,(COVID-19) QUALITATIVE NAAT: SARS CoV2 RNA: NOT DETECTED

## 2020-08-03 MED ORDER — BENZONATATE 100 MG PO CAPS
100.0000 mg | ORAL_CAPSULE | Freq: Three times a day (TID) | ORAL | 0 refills | Status: DC | PRN
Start: 2020-08-03 — End: 2020-10-04

## 2020-08-04 ENCOUNTER — Telehealth: Payer: Self-pay | Admitting: *Deleted

## 2020-08-04 DIAGNOSIS — J069 Acute upper respiratory infection, unspecified: Secondary | ICD-10-CM

## 2020-08-04 MED ORDER — DOXYCYCLINE HYCLATE 100 MG PO TABS: 100.0000 mg | ORAL_TABLET | Freq: Two times a day (BID) | ORAL | 0 refills | Status: DC

## 2020-08-04 NOTE — Telephone Encounter (Signed)
Patient notified and agreed.  

## 2020-08-04 NOTE — Telephone Encounter (Signed)
Doxycyline 100 mg by mouth twice daily for 1 week sent to pharmacy- to take with food and probiotic

## 2020-08-04 NOTE — Telephone Encounter (Signed)
Patient called and stated that he is not feeling much better. Stated that he still has a lot of coughing and congestion. Coughs up Yellow Congestion.  No fever. Feels very weak.   Saw Eye Dr. Burgess Estelle and he gave him Eye Antibiotic drops because all this is infecting his eyes.   Patient is requesting an antibiotic. Stated that he has had Biaxin in the past that helped with Bronchitis.   Patient has tried OTC Sudafed and has taken Benzonatate last night and it helped but doesn't last long.   Please Advise.

## 2020-08-07 DIAGNOSIS — H1089 Other conjunctivitis: Secondary | ICD-10-CM | POA: Diagnosis not present

## 2020-08-14 ENCOUNTER — Other Ambulatory Visit: Payer: Self-pay | Admitting: *Deleted

## 2020-08-14 NOTE — Patient Outreach (Addendum)
[facility]) Care Management  08/14/2020  Kenneth Doten Sr. September 13, 1950 [id number]  Unsuccessful outreach attempt made to patient. RN Health Coach left HIPAA compliant voicemail message along with her contact information.  Plan: RN Health Coach will call patient within the month of July.  [name] RN, BSN [facility] Care Management  RN Health Coach [phone] [name]

## 2020-08-27 ENCOUNTER — Other Ambulatory Visit: Payer: Self-pay | Admitting: Nurse Practitioner

## 2020-08-27 DIAGNOSIS — IMO0002 Reserved for concepts with insufficient information to code with codable children: Secondary | ICD-10-CM

## 2020-08-29 ENCOUNTER — Other Ambulatory Visit: Payer: Self-pay | Admitting: *Deleted

## 2020-08-29 DIAGNOSIS — IMO0002 Reserved for concepts with insufficient information to code with codable children: Secondary | ICD-10-CM

## 2020-08-29 MED ORDER — FREESTYLE LIBRE 14 DAY SENSOR MISC: 12 refills | Status: DC

## 2020-08-29 NOTE — Telephone Encounter (Signed)
Pharmacy requested refill

## 2020-09-05 ENCOUNTER — Other Ambulatory Visit: Payer: Self-pay

## 2020-09-05 DIAGNOSIS — H2513 Age-related nuclear cataract, bilateral: Secondary | ICD-10-CM | POA: Diagnosis not present

## 2020-09-05 DIAGNOSIS — E119 Type 2 diabetes mellitus without complications: Secondary | ICD-10-CM | POA: Diagnosis not present

## 2020-09-05 LAB — HM DIABETES EYE EXAM

## 2020-09-05 MED ORDER — GABAPENTIN 100 MG PO CAPS: 200.0000 mg | ORAL_CAPSULE | Freq: Every day | ORAL | 1 refills | Status: DC

## 2020-09-05 NOTE — Telephone Encounter (Signed)
Incoming fax received from [facility] requesting refill on gabapentin 3 by mouth at bedtime  I called patient to clarify as we have him listed as taking 2 by mouth at bedtime. Patient confirmed he is taking 2 by mouth at bedtime  RX request approved

## 2020-09-06 ENCOUNTER — Ambulatory Visit: Payer: Medicare Other | Admitting: Neurology

## 2020-09-14 ENCOUNTER — Ambulatory Visit (INDEPENDENT_AMBULATORY_CARE_PROVIDER_SITE_OTHER): Payer: Medicare Other | Admitting: Endocrinology

## 2020-09-14 ENCOUNTER — Encounter: Payer: Self-pay | Admitting: Endocrinology

## 2020-09-14 ENCOUNTER — Other Ambulatory Visit: Payer: Self-pay

## 2020-09-14 VITALS — HR 74 | Ht 68.0 in | Wt 189.0 lb

## 2020-09-14 DIAGNOSIS — IMO0002 Reserved for concepts with insufficient information to code with codable children: Secondary | ICD-10-CM

## 2020-09-14 DIAGNOSIS — E1151 Type 2 diabetes mellitus with diabetic peripheral angiopathy without gangrene: Secondary | ICD-10-CM | POA: Diagnosis not present

## 2020-09-14 DIAGNOSIS — E1159 Type 2 diabetes mellitus with other circulatory complications: Secondary | ICD-10-CM

## 2020-09-14 DIAGNOSIS — E1165 Type 2 diabetes mellitus with hyperglycemia: Secondary | ICD-10-CM | POA: Diagnosis not present

## 2020-09-14 DIAGNOSIS — Z794 Long term (current) use of insulin: Secondary | ICD-10-CM

## 2020-09-14 DIAGNOSIS — I25118 Atherosclerotic heart disease of native coronary artery with other forms of angina pectoris: Secondary | ICD-10-CM

## 2020-09-14 LAB — POCT GLYCOSYLATED HEMOGLOBIN (HGB A1C): Hemoglobin A1C: 8.6 % — AB (ref 4.0–5.6)

## 2020-09-14 MED ORDER — NOVOLOG FLEXPEN 100 UNIT/ML ~~LOC~~ SOPN: 15.0000 [IU] | PEN_INJECTOR | Freq: Three times a day (TID) | SUBCUTANEOUS | 3 refills | Status: DC

## 2020-09-14 NOTE — Patient Instructions (Addendum)
good diet and exercise significantly improve the control of your diabetes.  please let me know if you wish to be referred to a dietician.  high blood sugar is very risky to your health.  you should see an eye doctor and dentist every year.  It is very important to get all recommended vaccinations.  Controlling your blood pressure and cholesterol drastically reduces the damage diabetes does to your body.  Those who smoke should quit.  Please discuss these with your doctor.  check your blood sugar twice a day.  vary the time of day when you check, between before the 3 meals, and at bedtime.  also check if you have symptoms of your blood sugar being too high or too low.  please keep a record of the readings and bring it to your next appointment here (or you can bring the meter itself).  You can write it on any piece of paper.  please call us sooner if your blood sugar goes below 70, or if most of your readings are over 200. We will need to take this complex situation in stages For now, please: Please continue the same Basaglar, and: Change the Novolog to 15-20 units 3 times a day (just before each meal)--not at bedtime, and: Atop taking the metformin. Please come back for a follow-up appointment in 2 months.

## 2020-09-14 NOTE — Progress Notes (Signed)
Subjective:    Patient ID: Kenneth Monestime Sr., male    DOB: 12-Jul-1950, 70 y.o.   MRN: [id number]  HPI Wife provides hx, due to pt's memory loss.  pt is referred by Abbey Chatters, NP, for diabetes.  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  Last episode of severe hypoglycemia was 2019, but he has frequent mild hypoglycemia.  This happens in the middle of the night. He takes metformin and 2 insulins, but he sometimes misses, or takes Novolog PC.  He says Novolog (AC and HS) is approx 10-15 units 3 times a day (just before each meal).  I reviewed continuous glucose monitor data.  Glucose varies from 80-400.  It is in general highest 2PM-9PM, and at 9AM.  It is lowest 12MN-7AM, and at 11AM. Past Medical History:  Diagnosis Date   Arthritis    "left thumb" (05/23/2017) right shoulder   Barrett's esophagus    "we've been told that it's all gone; still take RX for GERD" (05/23/2017)   Bilateral swelling of feet and ankles x 3 weeks as of 12-01-2019   CAD (coronary artery disease), native coronary artery    Coronary angiogram 05/03/2014:  Proximal LAD 3.0x12 mm Promus premier stent.  04/08/2014: Mid Cx 3.5 x 16 mm Promus DES.  03/29/2013: Mid LAD 2.75 x 38 mm Promus Premier drug-eluting stent, balloon angioplasty of D1 and distal LAD and stenting of distal RCA with 2.75 x 24 mm promos Premier drug-eluting stent 12/15/2012 widely patent.   Cervicalgia    Chronic bronchitis (HCC)    "q yr" (05/23/2017)   Complication of anesthesia    " I do not wake up very well "   COVID-19 05/2019   all covid symptoms in hospital x 5 days, all symptoms resolved took monoclonal antibody tx    Family history of adverse reaction to anesthesia    "son w/PONV"   GERD (gastroesophageal reflux disease)    Heart murmur    "grew out of it"    Hyperlipidemia     Hypoglycemia, unspecified    IDDM (insulin dependent diabetes mellitus)    type 2   Impotence of organic origin    Iron deficiency anemia    Other malaise and fatigue    Pneumonia 05/2019   PONV (postoperative nausea and vomiting)    after wisdom teeth pulled   Restless leg    Rotator cuff arthropathy    Tension headache    "sometimes" (05/23/2017)   Unspecified gastritis and gastroduodenitis with hemorrhage    Unstable angina pectoris (HCC) 04/08/2014   Coronary angiogram 05/03/2014:  Proximal LAD 3.0x12 mm Promus premier stent.  04/08/2014: Mid Cx 3.5 x 16 mm Promus DES.  03/29/2013: Mid LAD 2.75 x 38 mm Promus Premier drug-eluting stent, balloon angioplasty of D1 and distal LAD and stenting of distal RCA with 2.75 x 24 mm promos Premier drug-eluting stent 12/15/2012 widely patent.    Past Surgical History:  Procedure Laterality Date   BICEPT TENODESIS Right 12/07/2019   Procedure: RIGHT BICEPS TENODESIS;  Surgeon: Sheral Apley, MD;  Location: [facility];  Service: Orthopedics;  Laterality: Right;   CARDIAC CATHETERIZATION N/A 09/25/2015   Procedure: Left Heart Cath and Coronary Angiography;  Surgeon: [name], MD;  Location: [facility] INVASIVE CV LAB;  Service:  Cardiovascular;  Laterality: N/A;   CARDIAC CATHETERIZATION N/A 09/25/2015   Procedure: Intravascular Pressure Wire/FFR Study;  Surgeon: [name], MD;  Location: [facility] INVASIVE CV LAB;  Service: Cardiovascular;  Laterality: N/A;   CORONARY ANGIOPLASTY WITH STENT PLACEMENT  12/15/2012; 04/28/2014; 05/03/2014   "2; 1; 1" , 4 stents 2 balloons   FRACTIONAL FLOW RESERVE WIRE Right 03/26/2013   Procedure: FRACTIONAL FLOW RESERVE WIRE;  Surgeon: Pamella Pert, MD;  Location: [facility] CATH LAB;  Service: Cardiovascular;  Laterality: Right;   FRACTIONAL FLOW RESERVE WIRE N/A 05/03/2014   Procedure: FRACTIONAL FLOW RESERVE WIRE;  Surgeon: Pamella Pert, MD;  Location: [facility] CATH LAB;  Service: Cardiovascular;  Laterality: N/A;   HERNIA  REPAIR  2010   "umbilical"    INCISION AND DRAINAGE ABSCESS Left 05/2007   "groin"    KNEE SURGERY Right 1992;  2000   "calcium deposits removed" (12/15/2012)   LEFT HEART CATH AND CORONARY ANGIOGRAPHY N/A 05/23/2017   Procedure: LEFT HEART CATH AND CORONARY ANGIOGRAPHY;  Surgeon: [name], MD;  Location: [facility] INVASIVE CV LAB;  Service: Cardiovascular;  Laterality: N/A;   LEFT HEART CATHETERIZATION WITH CORONARY ANGIOGRAM N/A 12/15/2012   Procedure: LEFT HEART CATHETERIZATION WITH CORONARY ANGIOGRAM;  Surgeon: Pamella Pert, MD;  Location: [facility] CATH LAB;  Service: Cardiovascular;  Laterality: N/A;   LEFT HEART CATHETERIZATION WITH CORONARY ANGIOGRAM N/A 03/26/2013   Procedure: LEFT HEART CATHETERIZATION WITH CORONARY ANGIOGRAM;  Surgeon: Pamella Pert, MD;  Location: [facility] CATH LAB;  Service: Cardiovascular;  Laterality: N/A;   LEFT HEART CATHETERIZATION WITH CORONARY ANGIOGRAM N/A 04/08/2014   Procedure: LEFT HEART CATHETERIZATION WITH CORONARY ANGIOGRAM;  Surgeon: Micheline Chapman, MD;  Location: [facility] CATH LAB;  Service: Cardiovascular;  Laterality: N/A;   PERCUTANEOUS CORONARY STENT INTERVENTION (PCI-S) N/A 05/03/2014   Procedure: PERCUTANEOUS CORONARY STENT INTERVENTION (PCI-S);  Surgeon: Pamella Pert, MD;  Location: [facility] CATH LAB;  Service: Cardiovascular;  Laterality: N/A;   ROTATOR CUFF REPAIR     UMBILICAL HERNIA REPAIR  yrs ago    Social History   Socioeconomic History   Marital status: Married    Spouse name: Pam   Number of children: 2   Years of education: Not on file   Highest education level: Not on file  Occupational History   Occupation: [employer] doctor  Tobacco Use   Smoking status: Never   Smokeless tobacco: Never  Vaping Use   Vaping Use: Never used  Substance and Sexual Activity   Alcohol use: No   Drug use: No   Sexual activity: Not Currently  Other Topics Concern   Not on file  Social History Narrative   Not on file   Social Determinants of Health    Financial Resource Strain: Low Risk    Difficulty of Paying Living Expenses: Not very hard  Food Insecurity: No Food Insecurity   Worried About Programme researcher, broadcasting/film/video in the Last Year: Never true   Ran Out of Food in the Last Year: Never true  Transportation Needs: No Transportation Needs   Lack of Transportation (Medical): No   Lack of Transportation (Non-Medical): No  Physical Activity: Not on file  Stress: Not on file  Social Connections: Not on file  Intimate Partner Violence: Not on file    Current Outpatient Medications on File Prior to Visit  Medication Sig Dispense Refill   albuterol (PROVENTIL HFA;VENTOLIN HFA) 108 (90 Base) MCG/ACT inhaler Inhale 2 puffs into the lungs every 6 (six) hours as needed  for wheezing or shortness of breath. 18 g 1   Alirocumab (PRALUENT) 150 MG/ML SOAJ Inject 1 pen into the skin every 14 (fourteen) days. 6 mL 3   aspirin 81 MG EC tablet Take 1 tablet (81 mg total) by mouth daily. 60 tablet 0   BD PEN NEEDLE NANO 2ND GEN 32G X 4 MM MISC USE AS DIRECTED 100 each 3   benzonatate (TESSALON) 100 MG capsule Take 1 capsule (100 mg total) by mouth 3 (three) times daily as needed for cough. 30 capsule 0   carvedilol (COREG) 6.25 MG tablet Take 1 tablet (6.25 mg total) by mouth 2 (two) times daily with a meal. 180 tablet 2   Continuous Blood Gluc Receiver (FREESTYLE LIBRE 14 DAY READER) DEVI Inject 1 Device as directed daily as needed. Use once daily as direct to check blood sugar E11.51 1 each 0   Continuous Blood Gluc Sensor (FREESTYLE LIBRE 14 DAY SENSOR) MISC Use to test blood sugar three times daily. E11.51. E11.59 2 each 12   D-5000 125 MCG (5000 UT) TABS TAKE 1 TABLET BY MOUTH EVERY DAY 90 tablet 1   doxycycline (VIBRA-TABS) 100 MG tablet Take 1 tablet (100 mg total) by mouth 2 (two) times daily. 14 tablet 0   famotidine (PEPCID) 20 MG tablet TAKE 1 TABLET BY MOUTH EVERY DAY 30 tablet 1   fluticasone (FLONASE) 50 MCG/ACT nasal spray Place 1 spray into  both nostrils daily as needed for allergies or rhinitis.     gabapentin (NEURONTIN) 100 MG capsule Take 2 capsules (200 mg total) by mouth at bedtime. 180 capsule 1   glucose 4 GM chewable tablet Chew 1 tablet (4 g total) by mouth as needed for low blood sugar. 50 tablet 12   Insulin Glargine (BASAGLAR KWIKPEN) 100 UNIT/ML Inject 40 units subcutaneously into the skin at bedtime 45 mL 4   LIFESCAN FINEPOINT LANCETS MISC Use to test for blood sugar three times daily dx: E11.51 300 each 1   nitroGLYCERIN (NITROSTAT) 0.4 MG SL tablet PLACE 1 TABLET (0.4 MG TOTAL) UNDER THE TONGUE EVERY 5 (FIVE) MINUTES AS NEEDED FOR CHEST PAIN. 50 tablet 0   ONETOUCH ULTRA test strip USE TO TEST FOR BLOOD SUGAR THREE TIMES DAILY DX: E11.51 300 strip 11   pantoprazole (PROTONIX) 40 MG tablet Take one tablet by mouth once daily for stomach 90 tablet 1   pramipexole (MIRAPEX) 1 MG tablet TAKE 1 TABLET BY MOUTH AT 5PM, 1 TABLET BY MOUTH AT 7PM, AND TAKE 2 TABLETS BY MOUTH AT BEDTIME. 360 tablet 1   valsartan (DIOVAN) 160 MG tablet Take 1 tablet (160 mg total) by mouth every evening. 90 tablet 3   No current facility-administered medications on file prior to visit.    Allergies  Allergen Reactions   Lyrica [Pregabalin] Other (See Comments)    Made patient very lethargic the next morning, very hard to patient to function, move, etc.    Statins Other (See Comments)    Causes Restless Legs, rhabdomyolysis   Trulicity [Dulaglutide] Other (See Comments)    GI side effects     Family History  Adopted: Yes  Problem Relation Age of Onset   Emphysema Mother     Pulse 74   Ht  (1.727 m)   Wt 189 lb (85.7 kg)   SpO2 96%   BMI 28.74 kg/m   Review of Systems denies n/v and depression.  He has gained a few lbs.  He has memory loss.  Objective:   Physical Exam Pulses: dorsalis pedis intact bilat.   MSK: no deformity of the feet CV: 1+ bilat leg edema Skin:  no ulcer on the feet.  normal color and temp  on the feet. Neuro: sensation is intact to touch on the feet.    Lab Results  Component Value Date   HGBA1C 8.6 (A) 09/14/2020   Lab Results  Component Value Date   CREATININE 1.25 (H) 06/30/2020   BUN 25 06/30/2020   NA 137 06/30/2020   K 5.2 06/30/2020   CL 102 06/30/2020   CO2 30 06/30/2020      Assessment & Plan:  Insulin-requiring type 2 DM: uncontrolled.  we discussed rx options.  He declines pump, including V-GO.    Patient Instructions  good diet and exercise significantly improve the control of your diabetes.  please let me know if you wish to be referred to a dietician.  high blood sugar is very risky to your health.  you should see an eye doctor and dentist every year.  It is very important to get all recommended vaccinations.  Controlling your blood pressure and cholesterol drastically reduces the damage diabetes does to your body.  Those who smoke should quit.  Please discuss these with your doctor.  check your blood sugar twice a day.  vary the time of day when you check, between before the 3 meals, and at bedtime.  also check if you have symptoms of your blood sugar being too high or too low.  please keep a record of the readings and bring it to your next appointment here (or you can bring the meter itself).  You can write it on any piece of paper.  please call us sooner if your blood sugar goes below 70, or if most of your readings are over 200. We will need to take this complex situation in stages For now, please: Please continue the same Basaglar, and: Change the Novolog to 15-20 units 3 times a day (just before each meal)--not at bedtime, and: Atop taking the metformin. Please come back for a follow-up appointment in 2 months.

## 2020-09-18 ENCOUNTER — Other Ambulatory Visit: Payer: Self-pay | Admitting: *Deleted

## 2020-09-18 NOTE — Patient Outreach (Signed)
[facility]) Care Management  09/18/2020  Kenneth Seabrooks Sr. 1950/05/02 [id number]  Unsuccessful outreach attempt made to patient. Patient answered the phone and stated that he would not be able to speak today. He did request that this nurse call back at a later date.   Plan: RN Health Coach will call patient within the month of August.  [name] RN, BSN [facility] Care Management  RN Health Coach [phone] [name]

## 2020-09-25 ENCOUNTER — Other Ambulatory Visit: Payer: Self-pay | Admitting: *Deleted

## 2020-09-25 MED ORDER — PANTOPRAZOLE SODIUM 40 MG PO TBEC: DELAYED_RELEASE_TABLET | ORAL | 1 refills | Status: DC

## 2020-09-25 NOTE — Telephone Encounter (Signed)
[facility] [address] requested refill.  ?

## 2020-10-04 ENCOUNTER — Encounter: Payer: Self-pay | Admitting: Nurse Practitioner

## 2020-10-04 ENCOUNTER — Ambulatory Visit (INDEPENDENT_AMBULATORY_CARE_PROVIDER_SITE_OTHER): Payer: Medicare Other | Admitting: Nurse Practitioner

## 2020-10-04 ENCOUNTER — Other Ambulatory Visit: Payer: Self-pay

## 2020-10-04 VITALS — BP 134/70 | HR 74 | Temp 97.5°F

## 2020-10-04 DIAGNOSIS — J069 Acute upper respiratory infection, unspecified: Secondary | ICD-10-CM

## 2020-10-04 DIAGNOSIS — I25118 Atherosclerotic heart disease of native coronary artery with other forms of angina pectoris: Secondary | ICD-10-CM

## 2020-10-04 MED ORDER — AMOXICILLIN-POT CLAVULANATE 875-125 MG PO TABS: 1.0000 | ORAL_TABLET | Freq: Two times a day (BID) | ORAL | 0 refills | Status: DC

## 2020-10-04 MED ORDER — NITROGLYCERIN 0.4 MG SL SUBL: 0.4000 mg | SUBLINGUAL_TABLET | SUBLINGUAL | 0 refills | Status: DC | PRN

## 2020-10-04 NOTE — Progress Notes (Signed)
Careteam: Patient Care Team: [name], NP as PCP - General (Geriatric Medicine) [name], MD as Consulting Physician (Cardiology) Van Clines, MD as Consulting Physician (Neurology) [name], [name], OD (Optometry) [name], MD as Consulting Physician (Otolaryngology) [name], [name], RN as [facility] Care Management  PLACE OF SERVICE:  [facility] CLINIC  Advanced Directive information    Allergies  Allergen Reactions   Lyrica [Pregabalin] Other (See Comments)    Made patient very lethargic the next morning, very hard to patient to function, move, etc.    Statins Other (See Comments)    Causes Restless Legs, rhabdomyolysis   Trulicity [Dulaglutide] Other (See Comments)    GI side effects     Chief Complaint  Patient presents with   Acute Visit    Chest congestion x 4-8 weeks, and now patient is experiencing voice loss x couple of weeks. FYI cyst on the right side of mouth, will have operation on 10/11/2020     HPI: Patient is a 70 y.o. male for nasal and chest congestion.  Reports he was in contact with a lady at church who had covid but this was a month ago.  Reports symptoms have been ongoing over several weeks but worse over 2-3 days. Reports more malaise and fatigue.  Reports chest congestion that he can not get up.  Pressure in the chest from congestion. Some shortness of breath. Reports he feels like he has to take a deep breath and will be breathing heavy but in a minute or 2 it will go away.   Reports he has a abscessed tooth and just completed antibiotic day before yesterday. Took medication for 5 days.   Review of Systems:  Review of Systems  Constitutional:  Positive for malaise/fatigue. Negative for chills, fever and weight loss.  HENT:  Positive for congestion and sore throat. Negative for sinus pain and tinnitus.   Respiratory:  Positive for cough. Negative for sputum production and shortness of breath.   Cardiovascular:  Negative  for chest pain, palpitations and leg swelling.  Gastrointestinal:  Negative for abdominal pain, constipation, diarrhea and heartburn.  Genitourinary:  Negative for dysuria, frequency and urgency.  Musculoskeletal:  Negative for back pain, falls, joint pain and myalgias.  Skin: Negative.   Neurological:  Positive for headaches. Negative for dizziness.   Past Medical History:  Diagnosis Date   Arthritis    "left thumb" (05/23/2017) right shoulder   Barrett's esophagus    "we've been told that it's all gone; still take RX for GERD" (05/23/2017)   Bilateral swelling of feet and ankles x 3 weeks as of 12-01-2019   CAD (coronary artery disease), native coronary artery    Coronary angiogram 05/03/2014:  Proximal LAD 3.0x12 mm Promus premier stent.  04/08/2014: Mid Cx 3.5 x 16 mm Promus DES.  03/29/2013: Mid LAD 2.75 x 38 mm Promus Premier drug-eluting stent, balloon angioplasty of D1 and distal LAD and stenting of distal RCA with 2.75 x 24 mm promos Premier drug-eluting stent 12/15/2012 widely patent.   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  04/08/2014: Mid Cx 3.5 x 16 mm Promus DES.  03/29/2013: Mid LAD 2.75 x 38 mm Promus Premier drug-eluting stent, balloon angioplasty of D1 and distal LAD and stenting of distal RCA with 2.75 x 24 mm promos Premier drug-eluting stent 12/15/2012 widely patent.   Past Surgical History:  Procedure Laterality Date   BICEPT TENODESIS Right 12/07/2019   Procedure: RIGHT BICEPS TENODESIS;  Surgeon: Sheral Apley, MD;  Location: [facility];  Service: Orthopedics;  Laterality: Right;   CARDIAC CATHETERIZATION N/A 09/25/2015   Procedure: Left Heart Cath and Coronary Angiography;  Surgeon: [name], MD;  Location: [facility] INVASIVE CV LAB;  Service: Cardiovascular;  Laterality: N/A;   CARDIAC CATHETERIZATION N/A 09/25/2015   Procedure: Intravascular Pressure Wire/FFR Study;  Surgeon: [name], MD;  Location: [facility] INVASIVE CV LAB;  Service: Cardiovascular;  Laterality: N/A;   CORONARY ANGIOPLASTY WITH STENT PLACEMENT  12/15/2012; 04/28/2014; 05/03/2014   "2; 1; 1" , 4 stents 2 balloons   FRACTIONAL FLOW RESERVE WIRE Right 03/26/2013   Procedure: FRACTIONAL FLOW RESERVE WIRE;  Surgeon: Pamella Pert, MD;  Location: [facility] CATH LAB;  Service: Cardiovascular;  Laterality: Right;   FRACTIONAL FLOW RESERVE WIRE N/A 05/03/2014   Procedure: FRACTIONAL FLOW RESERVE WIRE;  Surgeon: Pamella Pert, MD;  Location: [facility] CATH LAB;  Service: Cardiovascular;  Laterality: N/A;   HERNIA REPAIR  2010   "umbilical"    INCISION AND DRAINAGE ABSCESS Left 05/2007   "groin"    KNEE SURGERY Right 1992;  2000   "calcium deposits removed" (12/15/2012)   LEFT HEART CATH AND CORONARY ANGIOGRAPHY N/A 05/23/2017   Procedure: LEFT HEART CATH AND CORONARY ANGIOGRAPHY;  Surgeon: [name], MD;  Location: [facility] INVASIVE CV LAB;  Service: Cardiovascular;  Laterality: N/A;   LEFT HEART CATHETERIZATION WITH CORONARY ANGIOGRAM N/A 12/15/2012   Procedure: LEFT HEART CATHETERIZATION WITH CORONARY ANGIOGRAM;   Surgeon: Pamella Pert, MD;  Location: [facility] CATH LAB;  Service: Cardiovascular;  Laterality: N/A;   LEFT HEART CATHETERIZATION WITH CORONARY ANGIOGRAM N/A 03/26/2013   Procedure: LEFT HEART CATHETERIZATION WITH CORONARY ANGIOGRAM;  Surgeon: Pamella Pert, MD;  Location: [facility] CATH LAB;  Service: Cardiovascular;  Laterality: N/A;   LEFT HEART CATHETERIZATION WITH CORONARY ANGIOGRAM N/A 04/08/2014   Procedure: LEFT HEART CATHETERIZATION WITH CORONARY ANGIOGRAM;  Surgeon: Micheline Chapman, MD;  Location: [facility] CATH LAB;  Service: Cardiovascular;  Laterality: N/A;   PERCUTANEOUS CORONARY STENT INTERVENTION (PCI-S) N/A 05/03/2014   Procedure: PERCUTANEOUS CORONARY STENT INTERVENTION (PCI-S);  Surgeon: Pamella Pert, MD;  Location: [facility] CATH LAB;  Service: Cardiovascular;  Laterality: N/A;   ROTATOR CUFF REPAIR     UMBILICAL HERNIA REPAIR  yrs ago   Social History:   reports that he has never smoked. He has never used smokeless tobacco. He reports  that he does not drink alcohol and does not use drugs.  Family History  Adopted: Yes  Problem Relation Age of Onset   Emphysema Mother     Medications: Patient's Medications  New Prescriptions   No medications on file  Previous Medications   ALBUTEROL (PROVENTIL HFA;VENTOLIN HFA) 108 (90 BASE) MCG/ACT INHALER    Inhale 2 puffs into the lungs every 6 (six) hours as needed for wheezing or shortness of breath.   ALIROCUMAB (PRALUENT) 150 MG/ML SOAJ    Inject 1 pen into the skin every 14 (fourteen) days.   ASPIRIN 81 MG EC TABLET    Take 1 tablet (81 mg total) by mouth daily.   BD PEN NEEDLE NANO 2ND GEN 32G X 4 MM MISC    USE AS DIRECTED   CARVEDILOL (COREG) 6.25 MG TABLET    Take 1 tablet (6.25 mg total) by mouth 2 (two) times daily with a meal.   CONTINUOUS BLOOD GLUC RECEIVER (FREESTYLE LIBRE 14 DAY READER) DEVI    Inject 1 Device as directed daily as needed. Use once daily as direct to check blood sugar E11.51   CONTINUOUS BLOOD GLUC SENSOR (FREESTYLE  LIBRE 14 DAY SENSOR) MISC    Use to test blood sugar three times daily. E11.51. E11.59   D-5000 125 MCG (5000 UT) TABS    TAKE 1 TABLET BY MOUTH EVERY DAY   FAMOTIDINE (PEPCID) 20 MG TABLET    TAKE 1 TABLET BY MOUTH EVERY DAY   FLUTICASONE (FLONASE) 50 MCG/ACT NASAL SPRAY    Place 1 spray into both nostrils daily as needed for allergies or rhinitis.   GABAPENTIN (NEURONTIN) 100 MG CAPSULE    Take 2 capsules (200 mg total) by mouth at bedtime.   GLUCOSE 4 GM CHEWABLE TABLET    Chew 1 tablet (4 g total) by mouth as needed for low blood sugar.   INSULIN ASPART (NOVOLOG FLEXPEN) 100 UNIT/ML FLEXPEN    Inject 15-20 Units into the skin 3 (three) times daily with meals.   INSULIN GLARGINE (BASAGLAR KWIKPEN) 100 UNIT/ML    Inject 40 units subcutaneously into the skin at bedtime   LIFESCAN FINEPOINT LANCETS MISC    Use to test for blood sugar three times daily dx: E11.51   NITROGLYCERIN (NITROSTAT) 0.4 MG SL TABLET    PLACE 1 TABLET (0.4 MG TOTAL) UNDER THE TONGUE EVERY 5 (FIVE) MINUTES AS NEEDED FOR CHEST PAIN.   ONETOUCH ULTRA TEST STRIP    USE TO TEST FOR BLOOD SUGAR THREE TIMES DAILY DX: E11.51   PANTOPRAZOLE (PROTONIX) 40 MG TABLET    Take one tablet by mouth once daily for stomach   PRAMIPEXOLE (MIRAPEX) 1 MG TABLET    TAKE 1 TABLET BY MOUTH AT 5PM, 1 TABLET BY MOUTH AT 7PM, AND TAKE 2 TABLETS BY MOUTH AT BEDTIME.   VALSARTAN (DIOVAN) 160 MG TABLET    Take 1 tablet (160 mg total) by mouth every evening.  Modified Medications   No medications on file  Discontinued Medications   BENZONATATE (TESSALON) 100 MG CAPSULE    Take 1 capsule (100 mg total) by mouth 3 (three) times daily as needed for cough.   DOXYCYCLINE (VIBRA-TABS) 100 MG TABLET    Take 1 tablet (100 mg total) by mouth 2 (two) times daily.    Physical Exam:  Vitals:   10/04/20 1145  BP: 134/70  Pulse: 74  Temp: (!) 97.5 F (36.4 C)  TempSrc: Temporal  SpO2: 95%   There is  no height or weight on file to calculate BMI. Wt  Readings from Last 3 Encounters:  09/14/20 189 lb (85.7 kg)  07/14/20 194 lb (88 kg)  07/07/20 192 lb 9.6 oz (87.4 kg)    Physical Exam Constitutional:      General: He is not in acute distress.    Appearance: He is well-developed. He is not diaphoretic.  HENT:     Head: Normocephalic and atraumatic.     Right Ear: External ear normal.     Left Ear: External ear normal.     Nose: Congestion present.     Mouth/Throat:     Mouth: Mucous membranes are moist.     Pharynx: Oropharynx is clear. No oropharyngeal exudate or posterior oropharyngeal erythema.  Eyes:     Conjunctiva/sclera: Conjunctivae normal.     Pupils: Pupils are equal, round, and reactive to light.  Cardiovascular:     Rate and Rhythm: Normal rate and regular rhythm.     Heart sounds: Normal heart sounds.  Pulmonary:     Effort: Pulmonary effort is normal.     Breath sounds: Normal breath sounds.  Abdominal:     General: Bowel sounds are normal.     Palpations: Abdomen is soft.  Musculoskeletal:        General: No tenderness.     Cervical back: Normal range of motion and neck supple.     Right lower leg: No edema.     Left lower leg: No edema.  Skin:    General: Skin is warm and dry.  Neurological:     Mental Status: He is alert and oriented to person, place, and time.  Psychiatric:        Mood and Affect: Mood normal.        Behavior: Behavior normal.    Labs reviewed: Basic Metabolic Panel: Recent Labs    12/03/19 1338 12/07/19 0923 02/22/20 0805 06/30/20 0808  NA 137 137 135 137  K 4.2 4.5 4.8 5.2  CL 101 100 101 102  CO2 26  --  28 30  GLUCOSE 203* 235* 208* 152*  BUN 17 19 19 25   CREATININE 1.03 1.00 1.07 1.25*  CALCIUM 9.1  --  9.4 9.7   Liver Function Tests: Recent Labs    10/18/19 0811 12/03/19 1338 06/30/20 0808  AST 20 34 25  ALT 30 47* 36  ALKPHOS  --  48  --   BILITOT 0.5 0.8 0.6  PROT 6.3 7.0 6.6  ALBUMIN  --  4.3  --    No results for input(s): LIPASE, AMYLASE in the  last 8760 hours. No results for input(s): AMMONIA in the last 8760 hours. CBC: Recent Labs    10/18/19 0811 12/03/19 1338 12/07/19 0923 02/22/20 0805 06/30/20 0808  WBC 4.4 5.2  --  5.0 5.8  NEUTROABS 2,776  --   --  3,145 3,985  HGB 14.1 14.8 13.9 14.5 15.0  HCT 41.3 43.4 41.0 42.4 44.3  MCV 87.1 89.1  --  86.0 86.0  PLT 118* 155  --  143 131*   Lipid Panel: Recent Labs    10/18/19 0811 06/30/20 0808  CHOL 155 104  HDL 38* 42  LDLCALC 87 37  TRIG 199* 173*  CHOLHDL 4.1 2.5   TSH: No results for input(s): TSH in the last 8760 hours. A1C: Lab Results  Component Value Date   HGBA1C 8.6 (A) 09/14/2020     Assessment/Plan 1. Coronary artery disease involving native coronary artery  of native heart with other form of angina pectoris (HCC) No worsening of chest pain, dropped previously ntg tablets. Need refill.  - nitroGLYCERIN (NITROSTAT) 0.4 MG SL tablet; Place 1 tablet (0.4 mg total) under the tongue every 5 (five) minutes as needed for chest pain.  Dispense: 50 tablet; Refill: 0  2. Upper respiratory infection with cough and congestion --recommended to take Vit C 1000 mg twice daily, Vit D 5000 units daily and zinc 50 mg daily for 7 days -maintain proper hydration -tylenol 650 mg by mouth every 8 hours as needed fever/body aches.  -mucinex DM twice daily as needed for cough and chest congestion -do not sit in bed all day, sit up in chair and walk around as tolerated -nasal wash daily and nasal saline PRN.  -will test for COVID infection due to high transmission in area but will treat for sinusitis due to ongoing symptoms that have worsened.  - SARS-COV-2 RNA,(COVID-19) QUAL NAAT - amoxicillin-clavulanate (AUGMENTIN) 875-125 MG tablet; Take 1 tablet by mouth 2 (two) times daily.  Dispense: 14 tablet; Refill: 0 -aware to isolate until results come back.    Next appt: 01/12/2021 as scheduled, sooner if needed  Shanda BumpsJessica K. Biagio BorgEubanks, AGNP  [facility] [phone]

## 2020-10-04 NOTE — Patient Instructions (Addendum)
-recommended to take Vit C 1000 mg twice daily, Vit D 5000 units daily and zinc 50 mg daily for 7 days -maintain proper hydration -tylenol 650 mg by mouth every 8 hours as needed fever/body aches.  -mucinex DM twice daily as needed for cough and chest congestion -do not sit in bed all day, sit up in chair and walk around as tolerated -nasal wash daily and nasal saline PRN. -can use flonase 1 spray into each nare twice daily as needed nasal congestion -do not use sudafed    Sinusitis, Adult Sinusitis is inflammation of your sinuses. Sinuses are hollow spaces in the bones around your face. Your sinuses are located: Around your eyes. In the middle of your forehead. Behind your nose. In your cheekbones. Mucus normally drains out of your sinuses. When your nasal tissues become inflamed or swollen, mucus can become trapped or blocked. This allows bacteria, viruses, and fungi to grow, which leads to infection. Most infections of thesinuses are caused by a virus. Sinusitis can develop quickly. It can last for up to 4 weeks (acute) or for more than 12 weeks (chronic). Sinusitis often develops after a cold. What are the causes? This condition is caused by anything that creates swelling in the sinuses or stops mucus from draining. This includes: Allergies. Asthma. Infection from bacteria or viruses. Deformities or blockages in your nose or sinuses. Abnormal growths in the nose (nasal polyps). Pollutants, such as chemicals or irritants in the air. Infection from fungi (rare). What increases the risk? You are more likely to develop this condition if you: Have a weak body defense system (immune system). Do a lot of swimming or diving. Overuse nasal sprays. Smoke. What are the signs or symptoms? The main symptoms of this condition are pain and a feeling of pressure around the affected sinuses. Other symptoms include: Stuffy nose or congestion. Thick drainage from your nose. Swelling and  warmth over the affected sinuses. Headache. Upper toothache. A cough that may get worse at night. Extra mucus that collects in the throat or the back of the nose (postnasal drip). Decreased sense of smell and taste. Fatigue. A fever. Sore throat. Bad breath. How is this diagnosed? This condition is diagnosed based on: Your symptoms. Your medical history. A physical exam. Tests to find out if your condition is acute or chronic. This may include: Checking your nose for nasal polyps. Viewing your sinuses using a device that has a light (endoscope). Testing for allergies or bacteria. Imaging tests, such as an MRI or CT scan. In rare cases, a bone biopsy may be done to rule out more serious types offungal sinus disease. How is this treated? Treatment for sinusitis depends on the cause and whether your condition is chronic or acute. If caused by a virus, your symptoms should go away on their own within 10 days. You may be given medicines to relieve symptoms. They include: Medicines that shrink swollen nasal passages (topical intranasal decongestants). Medicines that treat allergies (antihistamines). A spray that eases inflammation of the nostrils (topical intranasal corticosteroids). Rinses that help get rid of thick mucus in your nose (nasal saline washes). If caused by bacteria, your health care provider may recommend waiting to see if your symptoms improve. Most bacterial infections will get better without antibiotic medicine. You may be given antibiotics if you have: A severe infection. A weak immune system. If caused by narrow nasal passages or nasal polyps, you may need to have surgery. Follow these instructions at home: Medicines Take,  use, or apply over-the-counter and prescription medicines only as told by your health care provider. These may include nasal sprays. If you were prescribed an antibiotic medicine, take it as told by your health care provider. Do not stop taking the  antibiotic even if you start to feel better. Hydrate and humidify  Drink enough fluid to keep your urine pale yellow. Staying hydrated will help to thin your mucus. Use a cool mist humidifier to keep the humidity level in your home above 50%. Inhale steam for 10-15 minutes, 3-4 times a day, or as told by your health care provider. You can do this in the bathroom while a hot shower is running. Limit your exposure to cool or dry air.  Rest Rest as much as possible. Sleep with your head raised (elevated). Make sure you get enough sleep each night. General instructions  Apply a warm, moist washcloth to your face 3-4 times a day or as told by your health care provider. This will help with discomfort. Wash your hands often with soap and water to reduce your exposure to germs. If soap and water are not available, use hand sanitizer. Do not smoke. Avoid being around people who are smoking (secondhand smoke). Keep all follow-up visits as told by your health care provider. This is important.  Contact a health care provider if: You have a fever. Your symptoms get worse. Your symptoms do not improve within 10 days. Get help right away if: You have a severe headache. You have persistent vomiting. You have severe pain or swelling around your face or eyes. You have vision problems. You develop confusion. Your neck is stiff. You have trouble breathing. Summary Sinusitis is soreness and inflammation of your sinuses. Sinuses are hollow spaces in the bones around your face. This condition is caused by nasal tissues that become inflamed or swollen. The swelling traps or blocks the flow of mucus. This allows bacteria, viruses, and fungi to grow, which leads to infection. If you were prescribed an antibiotic medicine, take it as told by your health care provider. Do not stop taking the antibiotic even if you start to feel better. Keep all follow-up visits as told by your health care provider. This is  important. This information is not intended to replace advice given to you by your health care provider. Make sure you discuss any questions you have with your healthcare provider. Document Revised: 07/21/2017 Document Reviewed: 07/21/2017 Elsevier Patient Education  2022 [employer].

## 2020-10-05 LAB — SARS-COV-2 RNA,(COVID-19) QUALITATIVE NAAT: SARS CoV2 RNA: NOT DETECTED

## 2020-10-23 ENCOUNTER — Other Ambulatory Visit: Payer: Self-pay | Admitting: *Deleted

## 2020-10-23 NOTE — Patient Outreach (Signed)
[facility]) Care Management  10/23/2020  Kenneth Kelm Sr. October 16, 1950 [id number]  Unsuccessful outreach attempt made to patient. Patient answered the phone and stated that he would not be able to speak today due to he is currently driving in [place], [place]. He did request that this nurse call back at a later date.   Plan: RN Health Coach will call patient within the month of September.  [name] RN, BSN [facility] Care Management  RN Health Coach [phone] [email or web address]

## 2020-10-25 ENCOUNTER — Encounter: Payer: Self-pay | Admitting: Nurse Practitioner

## 2020-10-25 ENCOUNTER — Telehealth: Payer: Self-pay

## 2020-10-25 ENCOUNTER — Telehealth (INDEPENDENT_AMBULATORY_CARE_PROVIDER_SITE_OTHER): Payer: Medicare Other | Admitting: Nurse Practitioner

## 2020-10-25 ENCOUNTER — Other Ambulatory Visit: Payer: Self-pay

## 2020-10-25 VITALS — Temp 97.6°F

## 2020-10-25 DIAGNOSIS — I25118 Atherosclerotic heart disease of native coronary artery with other forms of angina pectoris: Secondary | ICD-10-CM | POA: Diagnosis not present

## 2020-10-25 DIAGNOSIS — Z20822 Contact with and (suspected) exposure to covid-19: Secondary | ICD-10-CM | POA: Diagnosis not present

## 2020-10-25 DIAGNOSIS — U071 COVID-19: Secondary | ICD-10-CM | POA: Diagnosis not present

## 2020-10-25 MED ORDER — NIRMATRELVIR/RITONAVIR (PAXLOVID)TABLET: 3.0000 | ORAL_TABLET | Freq: Two times a day (BID) | ORAL | 0 refills | Status: AC

## 2020-10-25 NOTE — Progress Notes (Signed)
   This service is provided via telemedicine  No vital signs collected/recorded due to the encounter was a telemedicine visit.   Location of patient (ex: home, work):  Home  Patient consents to a telephone visit: Yes, see telephone visit dated 10/25/20  Location of the provider (ex: office, home):  [facility], Office   Name of any referring provider:  N/A  Names of all persons participating in the telemedicine service and their role in the encounter:  S.Chrae B/CMA, Abbey Chatters, NP, and Patient   Time spent on call:  9 min with medical assistant

## 2020-10-25 NOTE — Telephone Encounter (Signed)
Patient complains of coughing, tightness in chest, head congestion, sore throat, and chills. Patient states he's had this issue before and spoke to PCP Janyth Contes Janene Harvey, NP about it. Patient schedule for MyChart Video visit today 10/25/2020 at 9:30am. Message routed to PCP Janyth Contes, Janene Harvey, NP .

## 2020-10-25 NOTE — Telephone Encounter (Signed)
[name], mottola are scheduled for a virtual visit with your provider today.    Just as we do with appointments in the office, we must obtain your consent to participate.  Your consent will be active for this visit and any virtual visit you may have with one of our providers in the next 365 days.    If you have a MyChart account, I can also send a copy of this consent to you electronically.  All virtual visits are billed to your insurance company just like a traditional visit in the office.  As this is a virtual visit, video technology does not allow for your provider to perform a traditional examination.  This may limit your provider's ability to fully assess your condition.  If your provider identifies any concerns that need to be evaluated in person or the need to arrange testing such as labs, EKG, etc, we will make arrangements to do so.    Although advances in technology are sophisticated, we cannot ensure that it will always work on either your end or our end.  If the connection with a video visit is poor, we may have to switch to a telephone visit.  With either a video or telephone visit, we are not always able to ensure that we have a secure connection.   I need to obtain your verbal consent now.   Are you willing to proceed with your visit today?   Kenneth Devita Sr. has provided verbal consent on 10/25/2020 for a virtual visit (video or telephone).   [name], [place] 10/25/2020  9:04 AM

## 2020-10-25 NOTE — Progress Notes (Signed)
Careteam: Patient Care Team: [name], NP as PCP - General (Geriatric Medicine) [name], MD as Consulting Physician (Cardiology) Van Clines, MD as Consulting Physician (Neurology) [name], [name], OD (Optometry) [name], MD as Consulting Physician (Otolaryngology) [name], [name], RN as [facility] Care Management  Advanced Directive information    Allergies  Allergen Reactions   Lyrica [Pregabalin] Other (See Comments)    Made patient very lethargic the next morning, very hard to patient to function, move, etc.    Statins Other (See Comments)    Causes Restless Legs, rhabdomyolysis   Trulicity [Dulaglutide] Other (See Comments)    GI side effects     Chief Complaint  Patient presents with   Acute Visit    Patient c/o coughing, tightness in chest, head congestion, sore throat, and chills that onset yesterday about 3:30.Patient took an  at home covid test and does not know how to read results, will have Shanda Bumps look at on video. Visit completed via telephone/video visit.      HPI: Patient is a 70 y.o. male for acute visit due to not feeling well. Reports he had chills last night but never had fever.  Feels better this morning but head is stopped up.  Throat is sore, glands swollen.  Took at home COVID test. Which was positive.  He does not feel really bad right now but last night was miserable. Increase in congestion.   Has not COVID vaccine. Last had COVID in February 2021.  He had a bad case of COVID, was hospitalized with COVID pneumonia and admitted for 6 days.   Decrease appetite since yesterday.   Review of Systems:  Review of Systems  Constitutional:  Positive for chills and malaise/fatigue. Negative for fever and weight loss.  HENT:  Positive for congestion and sore throat. Negative for tinnitus.   Respiratory:  Positive for cough. Negative for sputum production and shortness of breath.   Cardiovascular:  Negative for chest  pain, palpitations and leg swelling.  Gastrointestinal:  Negative for abdominal pain, constipation, diarrhea and heartburn.  Genitourinary:  Negative for dysuria, frequency and urgency.  Musculoskeletal:  Negative for back pain, falls, joint pain and myalgias.  Skin: Negative.   Neurological:  Positive for headaches. Negative for dizziness.  Psychiatric/Behavioral:  Negative for depression and memory loss. The patient does not have insomnia.    Past Medical History:  Diagnosis Date   Arthritis    "left thumb" (05/23/2017) right shoulder   Barrett's esophagus    "we've been told that it's all gone; still take RX for GERD" (05/23/2017)   Bilateral swelling of feet and ankles x 3 weeks as of 12-01-2019   CAD (coronary artery disease), native coronary artery    Coronary angiogram 05/03/2014:  Proximal LAD 3.0x12 mm Promus premier stent.  04/08/2014: Mid Cx 3.5 x 16 mm Promus DES.  03/29/2013: Mid LAD 2.75 x 38 mm Promus Premier drug-eluting stent, balloon angioplasty of D1 and distal LAD and stenting of distal RCA with 2.75 x 24 mm promos Premier drug-eluting stent 12/15/2012 widely patent.   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  04/08/2014: Mid Cx 3.5 x 16 mm Promus DES.  03/29/2013: Mid LAD 2.75 x 38 mm Promus Premier drug-eluting stent, balloon angioplasty of D1 and distal LAD and stenting of distal RCA with 2.75 x 24 mm promos Premier drug-eluting stent 12/15/2012 widely patent.   Past Surgical History:  Procedure Laterality Date   BICEPT TENODESIS Right 12/07/2019   Procedure: RIGHT BICEPS TENODESIS;  Surgeon: Sheral ApleyMurphy, Timothy D, MD;  [facility] [place];  Service: Orthopedics;  Laterality: Right;   CARDIAC CATHETERIZATION N/A 09/25/2015   Procedure: Left Heart Cath and Coronary Angiography;  Surgeon: Yates DecampJay Ganji, MD;  [facility];  Service: Cardiovascular;  Laterality: N/A;   CARDIAC CATHETERIZATION N/A 09/25/2015   Procedure: Intravascular Pressure Wire/FFR Study;  Surgeon: Yates DecampJay Ganji, MD;  [facility];  Service: Cardiovascular;  Laterality: N/A;   CORONARY ANGIOPLASTY WITH STENT PLACEMENT  12/15/2012; 04/28/2014; 05/03/2014   "2; 1; 1" , 4 stents 2 balloons   FRACTIONAL FLOW RESERVE WIRE Right 03/26/2013   Procedure: FRACTIONAL FLOW RESERVE WIRE;  Surgeon: Pamella PertJagadeesh R Ganji, MD;  [facility];  Service: Cardiovascular;  Laterality: Right;   FRACTIONAL FLOW RESERVE WIRE N/A 05/03/2014   Procedure: FRACTIONAL FLOW RESERVE WIRE;  Surgeon: Pamella PertJagadeesh R Ganji, MD;  [facility];  Service: Cardiovascular;  Laterality: N/A;   HERNIA REPAIR  2010   "umbilical"    INCISION AND DRAINAGE ABSCESS Left 05/2007   "groin"    KNEE SURGERY Right 1992;  2000   "calcium deposits removed" (12/15/2012)   LEFT HEART CATH AND CORONARY ANGIOGRAPHY N/A 05/23/2017   Procedure: LEFT HEART CATH AND CORONARY ANGIOGRAPHY;  Surgeon: Yates DecampGanji, Jay, MD;  [facility];  Service: Cardiovascular;  Laterality: N/A;   LEFT HEART CATHETERIZATION WITH  CORONARY ANGIOGRAM N/A 12/15/2012   Procedure: LEFT HEART CATHETERIZATION WITH CORONARY ANGIOGRAM;  Surgeon: Pamella PertJagadeesh R Ganji, MD;  [facility];  Service: Cardiovascular;  Laterality: N/A;   LEFT HEART CATHETERIZATION WITH CORONARY ANGIOGRAM N/A 03/26/2013   Procedure: LEFT HEART CATHETERIZATION WITH CORONARY ANGIOGRAM;  Surgeon: Pamella PertJagadeesh R Ganji, MD;  [facility];  Service: Cardiovascular;  Laterality: N/A;   LEFT HEART CATHETERIZATION WITH CORONARY ANGIOGRAM N/A 04/08/2014   Procedure: LEFT HEART CATHETERIZATION WITH CORONARY ANGIOGRAM;  Surgeon: Micheline ChapmanMichael D Cooper, MD;  [facility];  Service: Cardiovascular;  Laterality: N/A;   PERCUTANEOUS CORONARY STENT INTERVENTION (PCI-S) N/A 05/03/2014   Procedure: PERCUTANEOUS CORONARY STENT INTERVENTION (PCI-S);  Surgeon: Pamella PertJagadeesh R Ganji, MD;  [facility];  Service: Cardiovascular;  Laterality: N/A;   ROTATOR CUFF REPAIR     UMBILICAL HERNIA REPAIR  yrs ago   Social History:   reports that he has never smoked.  He has never used smokeless tobacco. He reports that he does not drink alcohol and does not use drugs.  Family History  Adopted: Yes  Problem Relation Age of Onset   Emphysema Mother     Medications: Patient's Medications  New Prescriptions   No medications on file  Previous Medications   ALBUTEROL (PROVENTIL HFA;VENTOLIN HFA) 108 (90 BASE) MCG/ACT INHALER    Inhale 2 puffs into the lungs every 6 (six) hours as needed for wheezing or shortness of breath.   ALIROCUMAB (PRALUENT) 150 MG/ML SOAJ    Inject 1 pen into the skin every 14 (fourteen) days.   ASPIRIN 81 MG EC TABLET    Take 1 tablet (81 mg total) by mouth daily.   BD PEN NEEDLE NANO 2ND GEN 32G X 4 MM MISC    USE AS DIRECTED   CARVEDILOL (COREG) 6.25 MG TABLET    Take 1 tablet (6.25 mg total) by mouth 2 (two) times daily with a meal.   CONTINUOUS BLOOD GLUC RECEIVER (FREESTYLE LIBRE 14 DAY READER) DEVI    Inject 1 Device as directed daily as  needed. Use once daily as direct to check blood sugar E11.51   CONTINUOUS BLOOD GLUC SENSOR (FREESTYLE LIBRE 14 DAY SENSOR) MISC    Use to test blood sugar three times daily. E11.51. E11.59   D-5000 125 MCG (5000 UT) TABS    TAKE 1 TABLET BY MOUTH EVERY DAY   FAMOTIDINE (PEPCID) 20 MG TABLET    TAKE 1 TABLET BY MOUTH EVERY DAY   FLUTICASONE (FLONASE) 50 MCG/ACT NASAL SPRAY    Place 1 spray into both nostrils daily as needed for allergies or rhinitis.   GABAPENTIN (NEURONTIN) 100 MG CAPSULE    Take 2 capsules (200 mg total) by mouth at bedtime.   GLUCOSE 4 GM CHEWABLE TABLET    Chew 1 tablet (4 g total) by mouth as needed for low blood sugar.   INSULIN ASPART (NOVOLOG FLEXPEN) 100 UNIT/ML FLEXPEN    Inject 15-20 Units into the skin 3 (three) times daily with meals.   INSULIN GLARGINE (BASAGLAR KWIKPEN) 100 UNIT/ML    Inject 40 units subcutaneously into the skin at bedtime   LIFESCAN FINEPOINT LANCETS MISC    Use to test for blood sugar three times daily dx: E11.51   NITROGLYCERIN (NITROSTAT) 0.4 MG SL TABLET    Place 1 tablet (0.4 mg total) under the tongue every 5 (five) minutes as needed for chest pain.   ONETOUCH ULTRA TEST STRIP    USE TO TEST FOR BLOOD SUGAR THREE TIMES DAILY DX: E11.51   PANTOPRAZOLE (PROTONIX) 40 MG TABLET    Take one tablet by mouth once daily for stomach   PRAMIPEXOLE (MIRAPEX) 1 MG TABLET    TAKE 1 TABLET BY MOUTH AT 5PM, 1 TABLET BY MOUTH AT 7PM, AND TAKE 2 TABLETS BY MOUTH AT BEDTIME.   VALSARTAN (DIOVAN) 160 MG TABLET    Take 1 tablet (160 mg total) by mouth every evening.  Modified Medications   No medications on file  Discontinued Medications   AMOXICILLIN-CLAVULANATE (AUGMENTIN) 875-125 MG TABLET    Take 1 tablet by mouth 2 (two) times daily.    Physical Exam:  Vitals:   10/25/20 0900  Temp: 97.6 F (36.4 C)   There is no height or weight on file to calculate BMI. Wt Readings from Last 3 Encounters:  09/14/20 189 lb (85.7 kg)  07/14/20 194 lb (88 kg)   07/07/20 192 lb 9.6 oz (  87.4 kg)    Labs reviewed: Basic Metabolic Panel: Recent Labs    12/03/19 1338 12/07/19 0923 02/22/20 0805 06/30/20 0808  NA 137 137 135 137  K 4.2 4.5 4.8 5.2  CL 101 100 101 102  CO2 26  --  28 30  GLUCOSE 203* 235* 208* 152*  BUN 17 19 19 25   CREATININE 1.03 1.00 1.07 1.25*  CALCIUM 9.1  --  9.4 9.7   Liver Function Tests: Recent Labs    12/03/19 1338 06/30/20 0808  AST 34 25  ALT 47* 36  ALKPHOS 48  --   BILITOT 0.8 0.6  PROT 7.0 6.6  ALBUMIN 4.3  --    No results for input(s): LIPASE, AMYLASE in the last 8760 hours. No results for input(s): AMMONIA in the last 8760 hours. CBC: Recent Labs    12/03/19 1338 12/07/19 0923 02/22/20 0805 06/30/20 0808  WBC 5.2  --  5.0 5.8  NEUTROABS  --   --  3,145 3,985  HGB 14.8 13.9 14.5 15.0  HCT 43.4 41.0 42.4 44.3  MCV 89.1  --  86.0 86.0  PLT 155  --  143 131*   Lipid Panel: Recent Labs    06/30/20 0808  CHOL 104  HDL 42  LDLCALC 37  TRIG 173*  CHOLHDL 2.5   TSH: No results for input(s): TSH in the last 8760 hours. A1C: Lab Results  Component Value Date   HGBA1C 8.6 (A) 09/14/2020     Assessment/Plan 1. COVID-19 -recommended to take Vit C 1000 mg twice daily, Vit D 5000 units daily and zinc 50 mg daily for 7 days -maintain proper hydration -tylenol 650 mg by mouth every 8 hours as needed fever/body aches.  -mucinex DM twice daily as needed for cough and chest congestion -do not sit in bed all day, sit up in chair and walk around as tolerated -nasal wash daily and nasal saline PRN.  -education provided on when to follow up and when to seek immediate medication attention through the ED.  -education provided on paxlovid, pt with multiple comorbidies and at right for complications. Also has not been vaccinated.  - nirmatrelvir/ritonavir EUA (PAXLOVID) 20 x 150 MG & 10 x 100MG  TABS; Take 3 tablets by mouth 2 (two) times daily for 5 days. (Take nirmatrelvir 150 mg two tablets  twice daily for 5 days and ritonavir 100 mg one tablet twice daily for 5 days) Patient GFR is 58  Dispense: 30 tablet; Refill: 0  Next appt: 01/12/2021 . 13/01/2021  [facility] [phone]    Virtual Visit via [facility]  I connected with patient on 10/25/20 at  9:30 AM EDT by video and verified that I am speaking with the correct person using two identifiers.  Location: Patient: home Provider: office visit.    I discussed the limitations, risks, security and privacy concerns of performing an evaluation and management service by telephone and the availability of in person appointments. I also discussed with the patient that there may be a patient responsible charge related to this service. The patient expressed understanding and agreed to proceed.   I discussed the assessment and treatment plan with the patient. The patient was provided an opportunity to ask questions and all were answered. The patient agreed with the plan and demonstrated an understanding of the instructions.   The patient was advised to call back or seek an in-person evaluation if the symptoms worsen or if the condition fails to  improve as anticipated.  I provided 18 minutes of non-face-to-face time during this encounter.  Janene Harvey. Biag[employer] printed and mailed

## 2020-10-25 NOTE — Patient Instructions (Signed)
-  recommended to take Vit C 1000 mg twice daily, Vit D 5000 units daily and zinc 50 mg daily for 7 days -maintain proper hydration -tylenol 650 mg by mouth every 8 hours as needed fever/body aches/sore throat.  -mucinex DM twice daily as needed for cough and chest congestion -do not sit in bed all day, sit up in chair and walk around as tolerated -nasal wash daily and nasal saline as needed.

## 2020-11-19 ENCOUNTER — Other Ambulatory Visit: Payer: Self-pay | Admitting: Nurse Practitioner

## 2020-11-19 DIAGNOSIS — IMO0002 Reserved for concepts with insufficient information to code with codable children: Secondary | ICD-10-CM

## 2020-11-19 DIAGNOSIS — E1165 Type 2 diabetes mellitus with hyperglycemia: Secondary | ICD-10-CM

## 2020-11-23 ENCOUNTER — Other Ambulatory Visit: Payer: Self-pay | Admitting: *Deleted

## 2020-11-23 MED ORDER — PRAMIPEXOLE DIHYDROCHLORIDE 1 MG PO TABS: ORAL_TABLET | ORAL | 1 refills | Status: DC

## 2020-11-23 NOTE — Telephone Encounter (Signed)
Pharmacy requested refill

## 2020-11-24 ENCOUNTER — Ambulatory Visit: Payer: Medicare Other | Admitting: Endocrinology

## 2020-11-27 ENCOUNTER — Other Ambulatory Visit: Payer: Self-pay | Admitting: *Deleted

## 2020-11-27 NOTE — Patient Outreach (Signed)
[facility]) Care Management  11/27/2020  Hallie Ishida Sr. [birth year] [phone]  Unsuccessful outreach attempt made to patient. RN Health Coach left HIPAA compliant voicemail message along with her contact information.  Plan: RN Health Coach will call patient within the month of October.  [name] RN, BSN [facility] Care Management  RN Health Coach [phone] [email or web address]

## 2020-12-25 ENCOUNTER — Ambulatory Visit (INDEPENDENT_AMBULATORY_CARE_PROVIDER_SITE_OTHER): Payer: Medicare Other | Admitting: Endocrinology

## 2020-12-25 ENCOUNTER — Other Ambulatory Visit: Payer: Self-pay

## 2020-12-25 VITALS — BP 146/58 | HR 71 | Ht 68.0 in | Wt 205.8 lb

## 2020-12-25 DIAGNOSIS — I25118 Atherosclerotic heart disease of native coronary artery with other forms of angina pectoris: Secondary | ICD-10-CM

## 2020-12-25 DIAGNOSIS — E1151 Type 2 diabetes mellitus with diabetic peripheral angiopathy without gangrene: Secondary | ICD-10-CM

## 2020-12-25 LAB — POCT GLYCOSYLATED HEMOGLOBIN (HGB A1C): Hemoglobin A1C: 8.1 % — AB (ref 4.0–5.6)

## 2020-12-25 MED ORDER — DAPAGLIFLOZIN PROPANEDIOL 10 MG PO TABS: 10.0000 mg | ORAL_TABLET | Freq: Every day | ORAL | 3 refills | Status: DC

## 2020-12-25 NOTE — Progress Notes (Signed)
Subjective:    Patient ID: Kenneth Douse Sr., male    DOB: [birth year], 70 y.o.   MRN: [id number]  HPI Pt returns for f/u of diabetes mellitus: DM type: Insulin-requiring type 2 Dx'ed: 2011 Complications: stage 3 CRI and CAD; Therapy: insulin since 2016.  DKA: never Severe hypoglycemia: 2019 Pancreatitis: never Pancreatic imaging: normal on 2020 CT SDOH: Wife provides hx, due to pt's memory loss; he takes multiple daily injections.  Other: He did not tolerate trulicity (nausea) Interval history: Pt says he has intermitt hypoglycemia when he takes Novolog, but does not eat right away.  Cbg data are reviewed.  Glucose varies from 120-400.  It is in general highest 3PM, and less so at 8PM.  It is in general lowest at 6AM.    Past Medical History:  Diagnosis Date   Arthritis    "left thumb" (05/23/2017) right shoulder   Barrett's esophagus    "we've been told that it's all gone; still take RX for GERD" (05/23/2017)   Bilateral swelling of feet and ankles x 3 weeks as of 12-01-2019   CAD (coronary artery disease), native coronary artery    Coronary angiogram 05/03/2014:  Proximal LAD 3.0x12 mm Promus premier stent.  04/08/2014: Mid Cx 3.5 x 16 mm Promus DES.  03/29/2013: Mid LAD 2.75 x 38 mm Promus Premier drug-eluting stent, balloon angioplasty of D1 and distal LAD and stenting of distal RCA with 2.75 x 24 mm promos Premier drug-eluting stent 12/15/2012 widely patent.   Cervicalgia    Chronic bronchitis (HCC)    "q yr" (05/23/2017)   Complication of anesthesia    " I do not wake up very well "   COVID-19 05/2019   all covid symptoms in hospital x 5 days, all symptoms resolved took monoclonal antibody tx    Family history of adverse reaction to anesthesia    "son w/PONV"   GERD (gastroesophageal reflux disease)    Heart murmur    "grew out of it"    Hyperlipidemia    Hypoglycemia, unspecified    IDDM (insulin dependent diabetes mellitus)    type 2   Impotence of organic origin    Iron  deficiency anemia    Other malaise and fatigue    Pneumonia 05/2019   PONV (postoperative nausea and vomiting)    after wisdom teeth pulled   Restless leg    Rotator cuff arthropathy    Tension headache    "sometimes" (05/23/2017)   Unspecified gastritis and gastroduodenitis with hemorrhage    Unstable angina pectoris (HCC) 04/08/2014   Coronary angiogram 05/03/2014:  Proximal LAD 3.0x12 mm Promus premier stent.  04/08/2014: Mid Cx 3.5 x 16 mm Promus DES.  03/29/2013: Mid LAD 2.75 x 38 mm Promus Premier drug-eluting stent, balloon angioplasty of D1 and distal LAD and stenting of distal RCA with 2.75 x 24 mm promos Premier drug-eluting stent 12/15/2012 widely patent.    Past Surgical History:  Procedure Laterality Date   BICEPT TENODESIS Right 12/07/2019   Procedure: RIGHT BICEPS TENODESIS;  Surgeon: Sheral Apley, MD;  Location: [facility];  Service: Orthopedics;  Laterality: Right;   CARDIAC CATHETERIZATION N/A 09/25/2015   Procedure: Left Heart Cath and Coronary Angiography;  Surgeon: [name], MD;  Location: [facility] INVASIVE CV LAB;  Service: Cardiovascular;  Laterality: N/A;   CARDIAC CATHETERIZATION N/A 09/25/2015   Procedure: Intravascular Pressure Wire/FFR Study;  Surgeon: [name], MD;  Location: [facility] INVASIVE CV LAB;  Service: Cardiovascular;  Laterality: N/A;  CORONARY ANGIOPLASTY WITH STENT PLACEMENT  12/15/2012; 04/28/2014; 05/03/2014   "2; 1; 1" , 4 stents 2 balloons   FRACTIONAL FLOW RESERVE WIRE Right 03/26/2013   Procedure: FRACTIONAL FLOW RESERVE WIRE;  Surgeon: Pamella Pert, MD;  Location: [facility] CATH LAB;  Service: Cardiovascular;  Laterality: Right;   FRACTIONAL FLOW RESERVE WIRE N/A 05/03/2014   Procedure: FRACTIONAL FLOW RESERVE WIRE;  Surgeon: Pamella Pert, MD;  Location: [facility] CATH LAB;  Service: Cardiovascular;  Laterality: N/A;   HERNIA REPAIR  2010   "umbilical"    INCISION AND DRAINAGE ABSCESS Left 05/2007   "groin"    KNEE SURGERY Right 1992;  2000    "calcium deposits removed" (12/15/2012)   LEFT HEART CATH AND CORONARY ANGIOGRAPHY N/A 05/23/2017   Procedure: LEFT HEART CATH AND CORONARY ANGIOGRAPHY;  Surgeon: [name], MD;  Location: [facility] INVASIVE CV LAB;  Service: Cardiovascular;  Laterality: N/A;   LEFT HEART CATHETERIZATION WITH CORONARY ANGIOGRAM N/A 12/15/2012   Procedure: LEFT HEART CATHETERIZATION WITH CORONARY ANGIOGRAM;  Surgeon: Pamella Pert, MD;  Location: [facility] CATH LAB;  Service: Cardiovascular;  Laterality: N/A;   LEFT HEART CATHETERIZATION WITH CORONARY ANGIOGRAM N/A 03/26/2013   Procedure: LEFT HEART CATHETERIZATION WITH CORONARY ANGIOGRAM;  Surgeon: Pamella Pert, MD;  Location: [facility] CATH LAB;  Service: Cardiovascular;  Laterality: N/A;   LEFT HEART CATHETERIZATION WITH CORONARY ANGIOGRAM N/A 04/08/2014   Procedure: LEFT HEART CATHETERIZATION WITH CORONARY ANGIOGRAM;  Surgeon: Micheline Chapman, MD;  Location: [facility] CATH LAB;  Service: Cardiovascular;  Laterality: N/A;   PERCUTANEOUS CORONARY STENT INTERVENTION (PCI-S) N/A 05/03/2014   Procedure: PERCUTANEOUS CORONARY STENT INTERVENTION (PCI-S);  Surgeon: Pamella Pert, MD;  Location: [facility] CATH LAB;  Service: Cardiovascular;  Laterality: N/A;   ROTATOR CUFF REPAIR     UMBILICAL HERNIA REPAIR  yrs ago    Social History   Socioeconomic History   Marital status: Married    Spouse name: [name]   Number of children: 2   Years of education: Not on file   Highest education level: Not on file  Occupational History   Occupation: [employer] doctor  Tobacco Use   Smoking status: Never   Smokeless tobacco: Never  Vaping Use   Vaping Use: Never used  Substance and Sexual Activity   Alcohol use: No   Drug use: No   Sexual activity: Not Currently  Other Topics Concern   Not on file  Social History Narrative   Not on file   Social Determinants of Health   Financial Resource Strain: Not on file  Food Insecurity: Not on file  Transportation Needs: Not on file  Physical Activity:  Not on file  Stress: Not on file  Social Connections: Not on file  Intimate Partner Violence: Not on file    Current Outpatient Medications on File Prior to Visit  Medication Sig Dispense Refill   albuterol (PROVENTIL HFA;VENTOLIN HFA) 108 (90 Base) MCG/ACT inhaler Inhale 2 puffs into the lungs every 6 (six) hours as needed for wheezing or shortness of breath. 18 g 1   Alirocumab (PRALUENT) 150 MG/ML SOAJ Inject 1 pen into the skin every 14 (fourteen) days. 6 mL 3   aspirin 81 MG EC tablet Take 1 tablet (81 mg total) by mouth daily. 60 tablet 0   BD PEN NEEDLE NANO 2ND GEN 32G X 4 MM MISC USE AS DIRECTED 100 each 3   carvedilol (COREG) 6.25 MG tablet Take 1 tablet (6.25 mg total) by mouth 2 (two) times daily  with a meal. 180 tablet 2   Continuous Blood Gluc Receiver (FREESTYLE LIBRE 14 DAY READER) DEVI Inject 1 Device as directed daily as needed. Use once daily as direct to check blood sugar E11.51 1 each 0   Continuous Blood Gluc Sensor (FREESTYLE LIBRE 14 DAY SENSOR) MISC Use to test blood sugar three times daily. E11.51. E11.59 2 each 12   D-5000 125 MCG (5000 UT) TABS TAKE 1 TABLET BY MOUTH EVERY DAY 90 tablet 1   famotidine (PEPCID) 20 MG tablet TAKE 1 TABLET BY MOUTH EVERY DAY 30 tablet 1   fluticasone (FLONASE) 50 MCG/ACT nasal spray Place 1 spray into both nostrils daily as needed for allergies or rhinitis.     gabapentin (NEURONTIN) 100 MG capsule Take 2 capsules (200 mg total) by mouth at bedtime. 180 capsule 1   glucose 4 GM chewable tablet Chew 1 tablet (4 g total) by mouth as needed for low blood sugar. 50 tablet 12   insulin aspart (NOVOLOG FLEXPEN) 100 UNIT/ML FlexPen Inject 15-20 Units into the skin 3 (three) times daily with meals. 60 mL 3   Insulin Glargine (BASAGLAR KWIKPEN) 100 UNIT/ML Inject 40 units subcutaneously into the skin at bedtime 45 mL 4   LIFESCAN FINEPOINT LANCETS MISC Use to test for blood sugar three times daily dx: E11.51 300 each 1   nitroGLYCERIN  (NITROSTAT) 0.4 MG SL tablet Place 1 tablet (0.4 mg total) under the tongue every 5 (five) minutes as needed for chest pain. 50 tablet 0   ONETOUCH ULTRA test strip USE TO TEST FOR BLOOD SUGAR THREE TIMES DAILY DX: E11.51 300 strip 11   pantoprazole (PROTONIX) 40 MG tablet Take one tablet by mouth once daily for stomach 90 tablet 1   pramipexole (MIRAPEX) 1 MG tablet TAKE 1 TABLET BY MOUTH AT 5PM, 1 TABLET BY MOUTH AT 7PM, AND TAKE 2 TABLETS BY MOUTH AT BEDTIME. 360 tablet 1   valsartan (DIOVAN) 160 MG tablet Take 1 tablet (160 mg total) by mouth every evening. 90 tablet 3   No current facility-administered medications on file prior to visit.    Allergies  Allergen Reactions   Lyrica [Pregabalin] Other (See Comments)    Made patient very lethargic the next morning, very hard to patient to function, move, etc.    Statins Other (See Comments)    Causes Restless Legs, rhabdomyolysis   Trulicity [Dulaglutide] Other (See Comments)    GI side effects     Family History  Adopted: Yes  Problem Relation Age of Onset   Emphysema Mother     BP (!) 146/58 (BP Location: Right Arm, Patient Position: Sitting, Cuff Size: Large)   Pulse 71   Ht 5\' 8"  (1.727 m)   Wt 205 lb 12.8 oz (93.4 kg)   SpO2 97%   BMI 31.29 kg/m   Review of Systems He has gained weight.      Objective:   Physical Exam Pulses: dorsalis pedis intact bilat.   MSK: no deformity of the feet CV: 1+ bilat leg edema Skin:  no ulcer on the feet.  normal color and temp on the feet. Neuro: sensation is intact to touch on the feet.    Lab Results  Component Value Date   CREATININE 1.25 (H) 06/30/2020   BUN 25 06/30/2020   NA 137 06/30/2020   K 5.2 06/30/2020   CL 102 06/30/2020   CO2 30 06/30/2020    Lab Results  Component Value Date   HGBA1C 8.1 (A)  12/25/2020      Assessment & Plan:  Insulin-requiring type 2 DM: uncontrolled Hypoglycemia, due to delayed meal after Novolog  Patient Instructions  Your blood  pressure is high today.  Please see your primary care provider soon, to have it rechecked check your blood sugar twice a day.  vary the time of day when you check, between before the 3 meals, and at bedtime.  also check if you have symptoms of your blood sugar being too high or too low.  please keep a record of the readings and bring it to your next appointment here (or you can bring the meter itself).  You can write it on any piece of paper.  please call us sooner if your blood sugar goes below 70, or if most of your readings are over 200. I have sent a prescription to your pharmacy, to add [place]. Please continue the same insulins. The Novolog works really fast, so it is important to not take it until the food is in front of you.   Please come back for a follow-up appointment in 3 months.

## 2020-12-25 NOTE — Patient Instructions (Addendum)
Your blood pressure is high today.  Please see your primary care provider soon, to have it rechecked check your blood sugar twice a day.  vary the time of day when you check, between before the 3 meals, and at bedtime.  also check if you have symptoms of your blood sugar being too high or too low.  please keep a record of the readings and bring it to your next appointment here (or you can bring the meter itself).  You can write it on any piece of paper.  please call us sooner if your blood sugar goes below 70, or if most of your readings are over 200. I have sent a prescription to your pharmacy, to add [place]. Please continue the same insulins. The Novolog works really fast, so it is important to not take it until the food is in front of you.   Please come back for a follow-up appointment in 3 months.

## 2020-12-28 ENCOUNTER — Other Ambulatory Visit: Payer: Self-pay | Admitting: *Deleted

## 2020-12-28 NOTE — Patient Outreach (Signed)
[facility]) Care Management  12/28/2020  Kenneth Karge Sr. 1950/10/29 [id number]  Unsuccessful outreach attempt made to patient. RN Health Coach left HIPAA compliant voicemail message along with her contact information.  Plan: RN Health Coach will call patient within the month of November.  [name] RN, BSN [facility] Care Management  RN Health Coach [phone] [name]

## 2021-01-01 ENCOUNTER — Ambulatory Visit: Payer: Medicare Other | Admitting: Student

## 2021-01-01 ENCOUNTER — Encounter: Payer: Self-pay | Admitting: Student

## 2021-01-01 ENCOUNTER — Other Ambulatory Visit: Payer: Self-pay

## 2021-01-01 VITALS — BP 152/86 | HR 68 | Temp 98.0°F | Resp 16 | Ht 68.0 in | Wt 200.0 lb

## 2021-01-01 DIAGNOSIS — R0609 Other forms of dyspnea: Secondary | ICD-10-CM | POA: Diagnosis not present

## 2021-01-01 DIAGNOSIS — I209 Angina pectoris, unspecified: Secondary | ICD-10-CM

## 2021-01-01 DIAGNOSIS — I10 Essential (primary) hypertension: Secondary | ICD-10-CM

## 2021-01-01 DIAGNOSIS — I25118 Atherosclerotic heart disease of native coronary artery with other forms of angina pectoris: Secondary | ICD-10-CM

## 2021-01-01 MED ORDER — AMLODIPINE BESYLATE 5 MG PO TABS: 5.0000 mg | ORAL_TABLET | Freq: Every day | ORAL | 3 refills | Status: DC

## 2021-01-01 MED ORDER — METOPROLOL SUCCINATE ER 25 MG PO TB24: 25.0000 mg | ORAL_TABLET | Freq: Every day | ORAL | 3 refills | Status: DC

## 2021-01-01 NOTE — Progress Notes (Signed)
Primary Physician/Referring:  Lauree Chandler, NP  Patient ID: Jamas Lav Sr., male    DOB: [birth year], 70 y.o.   MRN: [id number]  Chief Complaint  Patient presents with   Coronary artery disease of native artery of native heart wi   Shortness of Breath   Chest Pain   HPI:    Titan Doda Sr.  is a 70 y.o. caucasian male with coronary artery disease, last cardiac catheterization on 05/29/2017 when he presented with unstable anginal-like symptoms revealing widely patent stent to his LAD, RCA and circumflex and also balloon angioplasty site to D1 and distal LAD. Fortunately his last PCI has been in 2016.  Past medical history significant for hypertension, hyperlipidemia, GERD, Barrett's esophagus, statin intolerance, uncontrolled diabetes mellitus and also severe restless leg syndrome.  Patient was last seen in our office by Dr. Einar Gip on 07/14/2020 at which time he was stable and advised for annual follow-up.  Patient now presents for urgent visit as his request with complaints of dyspnea on exertion.  Patient reports over the last 3 months he has been experiencing intermittent substernal chest pain as well as shortness of breath with exertion, even just walking around the house.  This has been progressively getting worse over the last few months.  He also describes an episode about 2 weeks ago of orthopnea.  He states shortness of breath and chest pain relieved with about 1 minute of rest.  He has taken sublingual nitroglycerin for 1 of these episodes, which improved his pain.  Patient used to walk 30 minutes/day without issue, he now can only walk 10 to 15 minutes before he has to stop due to discomfort.  Patient denies syncope, near syncope, dizziness.  He does have bilateral lower leg swelling, which patient states has improved since last visit.  Past Medical History:  Diagnosis Date   Arthritis    "left thumb" (05/23/2017) right shoulder   Barrett's esophagus    "we've been told that it's  all gone; still take RX for GERD" (05/23/2017)   Bilateral swelling of feet and ankles x 3 weeks as of 12-01-2019   CAD (coronary artery disease), native coronary artery    Coronary angiogram 05/03/2014:  Proximal LAD 3.0x12 mm Promus premier stent.  04/08/2014: Mid Cx 3.5 x 16 mm Promus DES.  03/29/2013: Mid LAD 2.75 x 38 mm Promus Premier drug-eluting stent, balloon angioplasty of D1 and distal LAD and stenting of distal RCA with 2.75 x 24 mm promos Premier drug-eluting stent 12/15/2012 widely patent.   Cervicalgia    Chronic bronchitis ([place])    "q yr" (Q000111Q)   Complication of anesthesia    " I do not wake up very well "   COVID-19 05/2019   all covid symptoms in hospital x 5 days, all symptoms resolved took monoclonal antibody tx    Family history of adverse reaction to anesthesia    "son w/PONV"   GERD (gastroesophageal reflux disease)    Heart murmur    "grew out of it"    Hyperlipidemia    Hypoglycemia, unspecified    IDDM (insulin dependent diabetes mellitus)    type 2   Impotence of organic origin    Iron deficiency anemia    Other malaise and fatigue    Pneumonia 05/2019   PONV (postoperative nausea and vomiting)    after wisdom teeth pulled   Restless leg    Rotator cuff arthropathy    Tension headache    "sometimes" (05/23/2017)  Unspecified gastritis and gastroduodenitis with hemorrhage    Unstable angina pectoris ([place]) 04/08/2014   Coronary angiogram 05/03/2014:  Proximal LAD 3.0x12 mm Promus premier stent.  04/08/2014: Mid Cx 3.5 x 16 mm Promus DES.  03/29/2013: Mid LAD 2.75 x 38 mm Promus Premier drug-eluting stent, balloon angioplasty of D1 and distal LAD and stenting of distal RCA with 2.75 x 24 mm promos Premier drug-eluting stent 12/15/2012 widely patent.   Past Surgical History:  Procedure Laterality Date   BICEPT TENODESIS Right 12/07/2019   Procedure: RIGHT BICEPS TENODESIS;  Surgeon: Renette Butters, MD;  Location: [facility];  Service: Orthopedics;   Laterality: Right;   CARDIAC CATHETERIZATION N/A 09/25/2015   Procedure: Left Heart Cath and Coronary Angiography;  Surgeon: Adrian Prows, MD;  Location: [place] [facility];  Service: Cardiovascular;  Laterality: N/A;   CARDIAC CATHETERIZATION N/A 09/25/2015   Procedure: Intravascular Pressure Wire/FFR Study;  Surgeon: Adrian Prows, MD;  Location: [place] [facility];  Service: Cardiovascular;  Laterality: N/A;   CORONARY ANGIOPLASTY WITH STENT PLACEMENT  12/15/2012; 04/28/2014; 05/03/2014   "2; 1; 1" , 4 stents 2 balloons   FRACTIONAL FLOW RESERVE WIRE Right 03/26/2013   Procedure: FRACTIONAL FLOW RESERVE WIRE;  Surgeon: Laverda Page, MD;  Location: [facility] CATH LAB;  Service: Cardiovascular;  Laterality: Right;   FRACTIONAL FLOW RESERVE WIRE N/A 05/03/2014   Procedure: FRACTIONAL FLOW RESERVE WIRE;  Surgeon: Laverda Page, MD;  Location: [facility] CATH LAB;  Service: Cardiovascular;  Laterality: N/A;   HERNIA REPAIR  [id number]   "umbilical"    INCISION AND DRAINAGE ABSCESS Left 05/2007   "groin"    KNEE SURGERY Right 1992;  2000   "calcium deposits removed" (12/15/2012)   LEFT HEART CATH AND CORONARY ANGIOGRAPHY N/A 05/23/2017   Procedure: LEFT HEART CATH AND CORONARY ANGIOGRAPHY;  Surgeon: Adrian Prows, MD;  Location: [place] [facility];  Service: Cardiovascular;  Laterality: N/A;   LEFT HEART CATHETERIZATION WITH CORONARY ANGIOGRAM N/A 12/15/2012   Procedure: LEFT HEART CATHETERIZATION WITH CORONARY ANGIOGRAM;  Surgeon: Laverda Page, MD;  Location: [facility] CATH LAB;  Service: Cardiovascular;  Laterality: N/A;   LEFT HEART CATHETERIZATION WITH CORONARY ANGIOGRAM N/A 03/26/2013   Procedure: LEFT HEART CATHETERIZATION WITH CORONARY ANGIOGRAM;  Surgeon: Laverda Page, MD;  Location: [facility] CATH LAB;  Service: Cardiovascular;  Laterality: N/A;   LEFT HEART CATHETERIZATION WITH CORONARY ANGIOGRAM N/A 04/08/2014   Procedure: LEFT HEART CATHETERIZATION WITH CORONARY ANGIOGRAM;  Surgeon: Blane Ohara, MD;  Location: [facility]  CATH LAB;  Service: Cardiovascular;  Laterality: N/A;   PERCUTANEOUS CORONARY STENT INTERVENTION (PCI-S) N/A 05/03/2014   Procedure: PERCUTANEOUS CORONARY STENT INTERVENTION (PCI-S);  Surgeon: Laverda Page, MD;  Location: [facility] CATH LAB;  Service: Cardiovascular;  Laterality: N/A;   ROTATOR CUFF REPAIR     UMBILICAL HERNIA REPAIR  yrs ago   Family History  Adopted: Yes  Problem Relation Age of Onset   Emphysema Mother    Social History   Tobacco Use   Smoking status: Never   Smokeless tobacco: Never  Substance Use Topics   Alcohol use: No     ROS  Review of Systems  Cardiovascular:  Positive for chest pain, claudication and dyspnea on exertion. Negative for leg swelling, near-syncope, orthopnea, palpitations, paroxysmal nocturnal dyspnea and syncope.  Respiratory:  Negative for shortness of breath.   Musculoskeletal:  Positive for muscle cramps (restless leg).  Neurological:  Negative for dizziness.  Objective   Vitals with BMI 01/01/2021  12/25/2020 10/04/2020  Height 5\' 8"  5\' 8"  -  Weight 200 lbs 205 lbs 13 oz -  BMI Q000111Q 123456 -  Systolic 0000000 123456 Q000111Q  Diastolic 86 58 70  Pulse 68 71 74    Blood pressure (!) 152/86, pulse 68, temperature 98 F (36.7 C), resp. rate 16, height 5\' 8"  (1.727 m), weight 200 lb (90.7 kg), SpO2 97 %. Body mass index is 30.41 kg/m.   Physical Exam Vitals reviewed.  Constitutional:      Appearance: He is well-developed.  Neck:     Vascular: No carotid bruit.  Cardiovascular:     Rate and Rhythm: Normal rate and regular rhythm.     Pulses:          Carotid pulses are 2+ on the right side and 2+ on the left side.      Femoral pulses are 2+ on the right side and 2+ on the left side.      Dorsalis pedis pulses are 0 on the right side and 0 on the left side.       Posterior tibial pulses are 1+ on the right side and 0 on the left side.     Heart sounds: Murmur heard.  Blowing decrescendo early diastolic murmur is present at the upper right  sternal border radiating to the apex.    No gallop.     Comments: No JVD. Pulmonary:     Effort: Pulmonary effort is normal.     Breath sounds: Normal breath sounds.  Musculoskeletal:     Right lower leg: Edema (1+ pitting) present.     Left lower leg: Edema (1+ pitting) present.   Laboratory examination:   Recent Labs    02/22/20 0805 06/30/20 0808  NA 135 137  K 4.8 5.2  CL 101 102  CO2 28 30  GLUCOSE 208* 152*  BUN 19 25  CREATININE 1.07 1.25*  CALCIUM 9.4 9.7  GFRNONAA 70 58*  GFRAA 81 67   CMP Latest Ref Rng & Units 06/30/2020 02/22/2020 12/07/2019  Glucose 65 - 99 mg/dL 152(H) 208(H) 235(H)  BUN 7 - 25 mg/dL 25 19 19   Creatinine 0.70 - 1.18 mg/dL 1.25(H) 1.07 1.00  Sodium 135 - 146 mmol/L 137 135 137  Potassium 3.5 - 5.3 mmol/L 5.2 4.8 4.5  Chloride 98 - 110 mmol/L 102 101 100  CO2 20 - 32 mmol/L 30 28 -  Calcium 8.6 - 10.3 mg/dL 9.7 9.4 -  Total Protein 6.1 - 8.1 g/dL 6.6 - -  Total Bilirubin 0.2 - 1.2 mg/dL 0.6 - -  Alkaline Phos 38 - 126 U/L - - -  AST 10 - 35 U/L 25 - -  ALT 9 - 46 U/L 36 - -   CBC Latest Ref Rng & Units 06/30/2020 02/22/2020 12/07/2019  WBC 3.8 - 10.8 Thousand/uL 5.8 5.0 -  Hemoglobin 13.2 - 17.1 g/dL 15.0 14.5 13.9  Hematocrit 38.5 - 50.0 % 44.3 42.4 41.0  Platelets 140 - 400 Thousand/uL 131(L) 143 -   Lipid Panel Recent Labs    06/30/20 0808  CHOL 104  TRIG 173*  LDLCALC 37  HDL 42  CHOLHDL 2.5    HEMOGLOBIN A1C Lab Results  Component Value Date   HGBA1C 8.1 (A) 12/25/2020   MPG 203 06/30/2020   TSH No results for input(s): TSH in the last 8760 hours.   Allergies   Allergies  Allergen Reactions   Lyrica [Pregabalin] Other (See Comments)    Made patient  very lethargic the next morning, very hard to patient to function, move, etc.    Statins Other (See Comments)    Causes Restless Legs, rhabdomyolysis   Trulicity [Dulaglutide] Other (See Comments)    GI side effects     Medications Prior to Visit:   Outpatient  Medications Prior to Visit  Medication Sig Dispense Refill   albuterol (PROVENTIL HFA;VENTOLIN HFA) 108 (90 Base) MCG/ACT inhaler Inhale 2 puffs into the lungs every 6 (six) hours as needed for wheezing or shortness of breath. 18 g 1   Alirocumab (PRALUENT) 150 MG/ML SOAJ Inject 1 pen into the skin every 14 (fourteen) days. 6 mL 3   aspirin 81 MG EC tablet Take 1 tablet (81 mg total) by mouth daily. 60 tablet 0   BD PEN NEEDLE NANO 2ND GEN 32G X 4 MM MISC USE AS DIRECTED 100 each 3   Continuous Blood Gluc Receiver (FREESTYLE LIBRE 14 DAY READER) DEVI Inject 1 Device as directed daily as needed. Use once daily as direct to check blood sugar E11.51 1 each 0   Continuous Blood Gluc Sensor (FREESTYLE LIBRE 14 DAY SENSOR) MISC Use to test blood sugar three times daily. E11.51. E11.59 2 each 12   D-5000 125 MCG (5000 UT) TABS TAKE 1 TABLET BY MOUTH EVERY DAY 90 tablet 1   dapagliflozin propanediol (FARXIGA) 10 MG TABS tablet Take 1 tablet (10 mg total) by mouth daily before breakfast. 90 tablet 3   famotidine (PEPCID) 20 MG tablet TAKE 1 TABLET BY MOUTH EVERY DAY 30 tablet 1   fluticasone (FLONASE) 50 MCG/ACT nasal spray Place 1 spray into both nostrils daily as needed for allergies or rhinitis.     gabapentin (NEURONTIN) 100 MG capsule Take 2 capsules (200 mg total) by mouth at bedtime. 180 capsule 1   glucose 4 GM chewable tablet Chew 1 tablet (4 g total) by mouth as needed for low blood sugar. 50 tablet 12   insulin aspart (NOVOLOG FLEXPEN) 100 UNIT/ML FlexPen Inject 15-20 Units into the skin 3 (three) times daily with meals. 60 mL 3   Insulin Glargine (BASAGLAR KWIKPEN) 100 UNIT/ML Inject 40 units subcutaneously into the skin at bedtime 45 mL 4   LIFESCAN FINEPOINT LANCETS MISC Use to test for blood sugar three times daily dx: E11.51 300 each 1   nitroGLYCERIN (NITROSTAT) 0.4 MG SL tablet Place 1 tablet (0.4 mg total) under the tongue every 5 (five) minutes as needed for chest pain. 50 tablet 0    ONETOUCH ULTRA test strip USE TO TEST FOR BLOOD SUGAR THREE TIMES DAILY DX: E11.51 300 strip 11   pantoprazole (PROTONIX) 40 MG tablet Take one tablet by mouth once daily for stomach 90 tablet 1   pramipexole (MIRAPEX) 1 MG tablet TAKE 1 TABLET BY MOUTH AT 5PM, 1 TABLET BY MOUTH AT 7PM, AND TAKE 2 TABLETS BY MOUTH AT BEDTIME. 360 tablet 1   valsartan (DIOVAN) 160 MG tablet Take 1 tablet (160 mg total) by mouth every evening. 90 tablet 3   carvedilol (COREG) 6.25 MG tablet Take 1 tablet (6.25 mg total) by mouth 2 (two) times daily with a meal. 180 tablet 2   No facility-administered medications prior to visit.   Final Medications at End of Visit    Current Meds  Medication Sig   albuterol (PROVENTIL HFA;VENTOLIN HFA) 108 (90 Base) MCG/ACT inhaler Inhale 2 puffs into the lungs every 6 (six) hours as needed for wheezing or shortness of breath.   Alirocumab ([place])  150 MG/ML SOAJ Inject 1 pen into the skin every 14 (fourteen) days.   amLODipine (NORVASC) 5 MG tablet Take 1 tablet (5 mg total) by mouth daily.   aspirin 81 MG EC tablet Take 1 tablet (81 mg total) by mouth daily.   BD PEN NEEDLE NANO 2ND GEN 32G X 4 MM MISC USE AS DIRECTED   Continuous Blood Gluc Receiver (FREESTYLE LIBRE 14 DAY READER) DEVI Inject 1 Device as directed daily as needed. Use once daily as direct to check blood sugar E11.51   Continuous Blood Gluc Sensor (FREESTYLE LIBRE 14 DAY SENSOR) MISC Use to test blood sugar three times daily. E11.51. E11.59   D-5000 125 MCG (5000 UT) TABS TAKE 1 TABLET BY MOUTH EVERY DAY   dapagliflozin propanediol (FARXIGA) 10 MG TABS tablet Take 1 tablet (10 mg total) by mouth daily before breakfast.   famotidine (PEPCID) 20 MG tablet TAKE 1 TABLET BY MOUTH EVERY DAY   fluticasone (FLONASE) 50 MCG/ACT nasal spray Place 1 spray into both nostrils daily as needed for allergies or rhinitis.   gabapentin (NEURONTIN) 100 MG capsule Take 2 capsules (200 mg total) by mouth at bedtime.   glucose 4  GM chewable tablet Chew 1 tablet (4 g total) by mouth as needed for low blood sugar.   insulin aspart (NOVOLOG FLEXPEN) 100 UNIT/ML FlexPen Inject 15-20 Units into the skin 3 (three) times daily with meals.   Insulin Glargine (BASAGLAR KWIKPEN) 100 UNIT/ML Inject 40 units subcutaneously into the skin at bedtime   LIFESCAN FINEPOINT LANCETS MISC Use to test for blood sugar three times daily dx: E11.51   metoprolol succinate (TOPROL-XL) 25 MG 24 hr tablet Take 1 tablet (25 mg total) by mouth daily. Take with or immediately following a meal.   nitroGLYCERIN (NITROSTAT) 0.4 MG SL tablet Place 1 tablet (0.4 mg total) under the tongue every 5 (five) minutes as needed for chest pain.   ONETOUCH ULTRA test strip USE TO TEST FOR BLOOD SUGAR THREE TIMES DAILY DX: E11.51   pantoprazole (PROTONIX) 40 MG tablet Take one tablet by mouth once daily for stomach   pramipexole (MIRAPEX) 1 MG tablet TAKE 1 TABLET BY MOUTH AT 5PM, 1 TABLET BY MOUTH AT 7PM, AND TAKE 2 TABLETS BY MOUTH AT BEDTIME.   valsartan (DIOVAN) 160 MG tablet Take 1 tablet (160 mg total) by mouth every evening.   [DISCONTINUED] carvedilol (COREG) 6.25 MG tablet Take 1 tablet (6.25 mg total) by mouth 2 (two) times daily with a meal.   Radiology   No results found.   Cardiac Studies:   Coronary angiogram 09/25/2015: Ostial Cx 50-60%, FFR 0.86. LVEF 60%. Patent stent placed 05/03/2014: Proximal LAD 3.0x12 mm Promus DES. 04/08/2014: Cx/OM1 with 3.5 x 16 mm Promus DES. H/O Mid LAD 2.75 x 38 mm Promus Premier DES, balloon angioplasty of D1 and distal LAD(03/29/2013) and Distal RCA stent (12/15/2012) with 2.75 x 24 mm promos Premier DES.  Lower extremity venous duplex for insufficiency 05/29/2017: Bilateral lower extremities negative for DVT or reflux.  Coronary angiogram 05/30/2019 and 05/23/2017: All stents placed previously are patent.  Proximal to mid LAD long stent (Overlaps prior stent) that was placed on 04/08/2014, circumflex/obtuse marginal-1  stenting. History of mid LAD stenting and balloon angioplasty to D1 and distal LAD performed on 03/29/2013, distal RCA stent in 2014. No change in ostial circumflex lesion, the mid LAD stent is widely patent however at the outflow, there is a 30-40% stenosis.  Lexiscan Tetrofosmin stress test 05/31/2019: No  previous exam available for comparison. Lexiscan nuclear stress test performed using 1-day protocol. Stress EKG is non-diagnostic, as this is pharmacological stress test. In addition, stress EKG at 85% MPHR shows sinus tachycardia, no ischemic changes.  Myocardial perfusion imaging is normal. Left ventricular ejection fraction is  71% with normal wall motion.  Low risk study.  Lower Extremity Arterial Duplex 06/22/2020: No hemodynamically significant stenoses are identified in the right or left lower extremity arterial system. Mildly  abnormal biphasic waveform noted throughout the lower extremity. The right and left mid posterior tibial and mid anterior tibial arteries are non-compressible due to medial arteriosclerosis.   Echocardiogram 06/22/2020: Normal LV systolic function with visual EF 60-65%. Left ventricle cavity is normal in size. Mild left ventricular hypertrophy. Normal global wall motion. Normal diastolic filling pattern, normal LAP.  Mild (Grade I) aortic regurgitation. Mild (Grade I) mitral regurgitation. Mild tricuspid regurgitation. No evidence of pulmonary hypertension. Prior study dated 06/08/2019: LVEF 57%, Grade I DD, Moderate AR, Mild to moderate TR, Mild PR, PASP 1mmHG.   EKG  01/01/2021: Sinus rhythm at a rate of 60 bpm.  Left axis, left anterior fascicular block.  Poor R wave progression, cannot exclude anteroseptal infarct old.  Nonspecific T wave abnormality.  Low voltage complexes, consider pulmonary disease pattern.  Compared to EKG 06/02/2020, no significant change.  Assessment     ICD-10-CM   1. Coronary artery disease of native artery of native heart with  stable angina pectoris (HCC)  I25.118 EKG 12-Lead    PCV MYOCARDIAL PERFUSION WITH LEXISCAN    metoprolol succinate (TOPROL-XL) 25 MG 24 hr tablet    amLODipine (NORVASC) 5 MG tablet    2. Essential hypertension  I10 metoprolol succinate (TOPROL-XL) 25 MG 24 hr tablet    amLODipine (NORVASC) 5 MG tablet    Basic metabolic panel    3. Dyspnea on exertion  R06.09 PCV ECHOCARDIOGRAM COMPLETE    PCV MYOCARDIAL PERFUSION WITH LEXISCAN    Brain natriuretic peptide    Basic metabolic panel    4. Angina pectoris (HCC)  I20.9 PCV ECHOCARDIOGRAM COMPLETE    PCV MYOCARDIAL PERFUSION WITH LEXISCAN    amLODipine (NORVASC) 5 MG tablet     Meds ordered this encounter  Medications   metoprolol succinate (TOPROL-XL) 25 MG 24 hr tablet    Sig: Take 1 tablet (25 mg total) by mouth daily. Take with or immediately following a meal.    Dispense:  30 tablet    Refill:  3   amLODipine (NORVASC) 5 MG tablet    Sig: Take 1 tablet (5 mg total) by mouth daily.    Dispense:  30 tablet    Refill:  3    Medications Discontinued During This Encounter  Medication Reason   carvedilol (COREG) 6.25 MG tablet Change in therapy    Recommendations:   Ladarien Braam Sr.  is a 70 y.o. caucasian male with coronary artery disease, last cardiac catheterization on 05/29/2017 when he presented with unstable anginal-like symptoms revealing widely patent stent to his LAD, RCA and circumflex and also balloon angioplasty site to D1 and distal LAD.  Fortunately his last PCI has been in 2016.  Past medical history significant for hypertension, hyperlipidemia, GERD, Barrett's esophagus, statin intolerance, uncontrolled diabetes mellitus and also severe restless leg syndrome.  He also had florid COVID 19 infection in February 2021.  Patient was last seen in our office by Dr. Einar Gip on 07/14/2020 at which time he was stable and advised for annual follow-up.  Patient now presents for urgent visit as his request with complaints of dyspnea on  exertion.  Patient's symptoms are highly suggestive of angina pectoris, given exertional nature and response to nitroglycerin.  Patient's EKG is unchanged compared to previous.  However given symptoms and history of CAD we will obtain nuclear stress test.  Given the patient has dyspnea with minimal exertion he is not a treadmill candidate, will therefore do Lexiscan nuclear stress test.  We will also obtain echocardiogram, particularly given dyspnea and orthopnea.  Will obtain BNP as well.  Patient's blood pressure is elevated, which may also be contributing to his chest pain symptoms.  We will switch from carvedilol to metoprolol succinate 25 mg daily and add amlodipine 5 mg daily, for both hypertension and antianginal benefits.  Follow-up in 4 weeks, sooner if needed, for hypertension and results of cardiac testing.   Alethia Berthold, PA-C 01/01/2021, 10:11 AM Office: [phone]

## 2021-01-03 DIAGNOSIS — I10 Essential (primary) hypertension: Secondary | ICD-10-CM | POA: Diagnosis not present

## 2021-01-03 DIAGNOSIS — R0609 Other forms of dyspnea: Secondary | ICD-10-CM | POA: Diagnosis not present

## 2021-01-04 ENCOUNTER — Other Ambulatory Visit: Payer: Self-pay

## 2021-01-04 ENCOUNTER — Ambulatory Visit: Payer: Medicare Other

## 2021-01-04 DIAGNOSIS — R0609 Other forms of dyspnea: Secondary | ICD-10-CM

## 2021-01-04 DIAGNOSIS — I209 Angina pectoris, unspecified: Secondary | ICD-10-CM | POA: Diagnosis not present

## 2021-01-04 LAB — BASIC METABOLIC PANEL
BUN/Creatinine Ratio: 20 (ref 10–24)
BUN: 28 mg/dL — ABNORMAL HIGH (ref 8–27)
CO2: 22 mmol/L (ref 20–29)
Calcium: 9.3 mg/dL (ref 8.6–10.2)
Chloride: 103 mmol/L (ref 96–106)
Creatinine, Ser: 1.41 mg/dL — ABNORMAL HIGH (ref 0.76–1.27)
Glucose: 204 mg/dL — ABNORMAL HIGH (ref 70–99)
Potassium: 5.2 mmol/L (ref 3.5–5.2)
Sodium: 141 mmol/L (ref 134–144)
eGFR: 53 mL/min/{1.73_m2} — ABNORMAL LOW (ref >59)

## 2021-01-04 LAB — BRAIN NATRIURETIC PEPTIDE: BNP: 16.8 pg/mL (ref 0.0–100.0)

## 2021-01-08 ENCOUNTER — Other Ambulatory Visit: Payer: Medicare Other

## 2021-01-10 ENCOUNTER — Other Ambulatory Visit: Payer: Medicare Other

## 2021-01-12 ENCOUNTER — Ambulatory Visit: Payer: Medicare Other | Admitting: Nurse Practitioner

## 2021-01-12 DIAGNOSIS — Z23 Encounter for immunization: Secondary | ICD-10-CM

## 2021-01-14 ENCOUNTER — Emergency Department (HOSPITAL_COMMUNITY): Payer: Medicare Other

## 2021-01-14 ENCOUNTER — Encounter (HOSPITAL_COMMUNITY): Payer: Self-pay | Admitting: Emergency Medicine

## 2021-01-14 ENCOUNTER — Observation Stay (HOSPITAL_COMMUNITY): Payer: Medicare Other

## 2021-01-14 ENCOUNTER — Observation Stay (HOSPITAL_COMMUNITY)
Admission: EM | Admit: 2021-01-14 | Discharge: 2021-01-15 | Disposition: A | Discharge status: Home / Self Care | Payer: Medicare Other | Attending: Emergency Medicine | Admitting: Emergency Medicine

## 2021-01-14 ENCOUNTER — Other Ambulatory Visit: Payer: Self-pay

## 2021-01-14 DIAGNOSIS — Z7982 Long term (current) use of aspirin: Secondary | ICD-10-CM | POA: Insufficient documentation

## 2021-01-14 DIAGNOSIS — Z794 Long term (current) use of insulin: Secondary | ICD-10-CM | POA: Insufficient documentation

## 2021-01-14 DIAGNOSIS — I25118 Atherosclerotic heart disease of native coronary artery with other forms of angina pectoris: Secondary | ICD-10-CM | POA: Diagnosis present

## 2021-01-14 DIAGNOSIS — D696 Thrombocytopenia, unspecified: Secondary | ICD-10-CM | POA: Diagnosis not present

## 2021-01-14 DIAGNOSIS — Z8616 Personal history of COVID-19: Secondary | ICD-10-CM | POA: Diagnosis not present

## 2021-01-14 DIAGNOSIS — Z20822 Contact with and (suspected) exposure to covid-19: Secondary | ICD-10-CM | POA: Insufficient documentation

## 2021-01-14 DIAGNOSIS — G459 Transient cerebral ischemic attack, unspecified: Secondary | ICD-10-CM | POA: Diagnosis not present

## 2021-01-14 DIAGNOSIS — E119 Type 2 diabetes mellitus without complications: Secondary | ICD-10-CM

## 2021-01-14 DIAGNOSIS — I10 Essential (primary) hypertension: Secondary | ICD-10-CM | POA: Insufficient documentation

## 2021-01-14 DIAGNOSIS — Z79899 Other long term (current) drug therapy: Secondary | ICD-10-CM | POA: Diagnosis not present

## 2021-01-14 DIAGNOSIS — Z955 Presence of coronary angioplasty implant and graft: Secondary | ICD-10-CM | POA: Diagnosis not present

## 2021-01-14 DIAGNOSIS — R29810 Facial weakness: Secondary | ICD-10-CM | POA: Diagnosis not present

## 2021-01-14 DIAGNOSIS — Z8673 Personal history of transient ischemic attack (TIA), and cerebral infarction without residual deficits: Secondary | ICD-10-CM | POA: Insufficient documentation

## 2021-01-14 DIAGNOSIS — R29818 Other symptoms and signs involving the nervous system: Secondary | ICD-10-CM | POA: Diagnosis not present

## 2021-01-14 DIAGNOSIS — I2510 Atherosclerotic heart disease of native coronary artery without angina pectoris: Secondary | ICD-10-CM | POA: Diagnosis not present

## 2021-01-14 DIAGNOSIS — G2581 Restless legs syndrome: Secondary | ICD-10-CM | POA: Diagnosis not present

## 2021-01-14 DIAGNOSIS — I6529 Occlusion and stenosis of unspecified carotid artery: Secondary | ICD-10-CM | POA: Diagnosis not present

## 2021-01-14 HISTORY — DX: Transient cerebral ischemic attack, unspecified: G45.9

## 2021-01-14 LAB — I-STAT CHEM 8, ED
BUN: 22 mg/dL (ref 8–23)
Calcium, Ion: 1.15 mmol/L (ref 1.15–1.40)
Chloride: 104 mmol/L (ref 98–111)
Creatinine, Ser: 1.2 mg/dL (ref 0.61–1.24)
Glucose, Bld: 184 mg/dL — ABNORMAL HIGH (ref 70–99)
HCT: 41 % (ref 39.0–52.0)
Hemoglobin: 13.9 g/dL (ref 13.0–17.0)
Potassium: 4.1 mmol/L (ref 3.5–5.1)
Sodium: 140 mmol/L (ref 135–145)
TCO2: 24 mmol/L (ref 22–32)

## 2021-01-14 LAB — RESP PANEL BY RT-PCR (FLU A&B, COVID) ARPGX2
Influenza A by PCR: NEGATIVE
Influenza B by PCR: NEGATIVE
SARS Coronavirus 2 by RT PCR: NEGATIVE

## 2021-01-14 LAB — DIFFERENTIAL
Abs Immature Granulocytes: 0.03 10*3/uL (ref 0.00–0.07)
Basophils Absolute: 0 10*3/uL (ref 0.0–0.1)
Basophils Relative: 1 %
Eosinophils Absolute: 0.1 10*3/uL (ref 0.0–0.5)
Eosinophils Relative: 2 %
Immature Granulocytes: 1 %
Lymphocytes Relative: 22 %
Lymphs Abs: 1.2 10*3/uL (ref 0.7–4.0)
Monocytes Absolute: 0.3 10*3/uL (ref 0.1–1.0)
Monocytes Relative: 6 %
Neutro Abs: 3.9 10*3/uL (ref 1.7–7.7)
Neutrophils Relative %: 68 %

## 2021-01-14 LAB — CBC
HCT: 42.5 % (ref 39.0–52.0)
Hemoglobin: 14.2 g/dL (ref 13.0–17.0)
MCH: 29.4 pg (ref 26.0–34.0)
MCHC: 33.4 g/dL (ref 30.0–36.0)
MCV: 88 fL (ref 80.0–100.0)
Platelets: 131 10*3/uL — ABNORMAL LOW (ref 150–400)
RBC: 4.83 MIL/uL (ref 4.22–5.81)
RDW: 12.9 % (ref 11.5–15.5)
WBC: 5.6 10*3/uL (ref 4.0–10.5)
nRBC: 0 % (ref 0.0–0.2)

## 2021-01-14 LAB — PROTIME-INR
INR: 1 (ref 0.8–1.2)
Prothrombin Time: 13.1 seconds (ref 11.4–15.2)

## 2021-01-14 LAB — COMPREHENSIVE METABOLIC PANEL
ALT: 38 U/L (ref 0–44)
AST: 24 U/L (ref 15–41)
Albumin: 3.8 g/dL (ref 3.5–5.0)
Alkaline Phosphatase: 45 U/L (ref 38–126)
Anion gap: 7 (ref 5–15)
BUN: 20 mg/dL (ref 8–23)
CO2: 25 mmol/L (ref 22–32)
Calcium: 9.2 mg/dL (ref 8.9–10.3)
Chloride: 106 mmol/L (ref 98–111)
Creatinine, Ser: 1.36 mg/dL — ABNORMAL HIGH (ref 0.61–1.24)
GFR, Estimated: 56 mL/min — ABNORMAL LOW (ref >60)
Glucose, Bld: 180 mg/dL — ABNORMAL HIGH (ref 70–99)
Potassium: 4.4 mmol/L (ref 3.5–5.1)
Sodium: 138 mmol/L (ref 135–145)
Total Bilirubin: 0.6 mg/dL (ref 0.3–1.2)
Total Protein: 6.5 g/dL (ref 6.5–8.1)

## 2021-01-14 LAB — APTT: aPTT: 29 seconds (ref 24–36)

## 2021-01-14 MED ORDER — ENOXAPARIN SODIUM 40 MG/0.4ML IJ SOSY
40.0000 mg | PREFILLED_SYRINGE | INTRAMUSCULAR | Status: DC
  Administered 2021-01-14: 40 mg via SUBCUTANEOUS
  Filled 2021-01-14: qty 0.4

## 2021-01-14 MED ORDER — STROKE: EARLY STAGES OF RECOVERY BOOK: Freq: Once | Status: AC

## 2021-01-14 MED ORDER — SODIUM CHLORIDE 0.9 % IV SOLN: Freq: Once | INTRAVENOUS | Status: AC

## 2021-01-14 MED ORDER — ASPIRIN 325 MG PO TABS: 325.0000 mg | ORAL_TABLET | Freq: Every day | ORAL | Status: DC

## 2021-01-14 MED ORDER — CLOPIDOGREL BISULFATE 75 MG PO TABS
75.0000 mg | ORAL_TABLET | Freq: Every day | ORAL | Status: DC
  Administered 2021-01-15: 75 mg via ORAL
  Filled 2021-01-14: qty 1

## 2021-01-14 MED ORDER — ASPIRIN 81 MG PO CHEW
81.0000 mg | CHEWABLE_TABLET | Freq: Every day | ORAL | Status: DC
  Administered 2021-01-15: 81 mg via ORAL
  Filled 2021-01-14: qty 1

## 2021-01-14 MED ORDER — INSULIN ASPART 100 UNIT/ML IJ SOLN
0.0000 [IU] | Freq: Three times a day (TID) | INTRAMUSCULAR | Status: DC
  Administered 2021-01-15: 3 [IU] via SUBCUTANEOUS
  Administered 2021-01-15: 4 [IU] via SUBCUTANEOUS

## 2021-01-14 MED ORDER — ACETAMINOPHEN 160 MG/5ML PO SOLN: 650.0000 mg | ORAL | Status: DC | PRN

## 2021-01-14 MED ORDER — PRAMIPEXOLE DIHYDROCHLORIDE 1 MG PO TABS
1.0000 mg | ORAL_TABLET | Freq: Once | ORAL | Status: AC
  Administered 2021-01-14: 1 mg via ORAL
  Filled 2021-01-14: qty 1

## 2021-01-14 MED ORDER — ASPIRIN 300 MG RE SUPP
300.0000 mg | Freq: Every day | RECTAL | Status: DC
  Administered 2021-01-14: 300 mg via RECTAL

## 2021-01-14 MED ORDER — PRAMIPEXOLE DIHYDROCHLORIDE 1 MG PO TABS
2.0000 mg | ORAL_TABLET | Freq: Every day | ORAL | Status: DC
  Administered 2021-01-14: 2 mg via ORAL
  Filled 2021-01-14 (×2): qty 2

## 2021-01-14 MED ORDER — PANTOPRAZOLE SODIUM 40 MG PO TBEC
40.0000 mg | DELAYED_RELEASE_TABLET | Freq: Every day | ORAL | Status: DC
  Administered 2021-01-15: 40 mg via ORAL
  Filled 2021-01-14: qty 1

## 2021-01-14 MED ORDER — ACETAMINOPHEN 650 MG RE SUPP: 650.0000 mg | RECTAL | Status: DC | PRN

## 2021-01-14 MED ORDER — ACETAMINOPHEN 325 MG PO TABS: 650.0000 mg | ORAL_TABLET | ORAL | Status: DC | PRN

## 2021-01-14 MED ORDER — PRAMIPEXOLE DIHYDROCHLORIDE 1 MG PO TABS
1.0000 mg | ORAL_TABLET | Freq: Two times a day (BID) | ORAL | Status: DC
  Filled 2021-01-14 (×3): qty 1

## 2021-01-14 MED ORDER — INSULIN GLARGINE-YFGN 100 UNIT/ML ~~LOC~~ SOLN
40.0000 [IU] | Freq: Every day | SUBCUTANEOUS | Status: DC
  Administered 2021-01-14: 40 [IU] via SUBCUTANEOUS
  Filled 2021-01-14 (×2): qty 0.4

## 2021-01-14 MED ORDER — FLUTICASONE PROPIONATE 50 MCG/ACT NA SUSP
1.0000 | Freq: Every day | NASAL | Status: DC | PRN
  Filled 2021-01-14: qty 16

## 2021-01-14 MED ORDER — CLOPIDOGREL BISULFATE 300 MG PO TABS
300.0000 mg | ORAL_TABLET | Freq: Once | ORAL | Status: AC
  Administered 2021-01-14: 300 mg via ORAL
  Filled 2021-01-14: qty 1

## 2021-01-14 MED ORDER — SODIUM CHLORIDE 0.9% FLUSH
3.0000 mL | Freq: Once | INTRAVENOUS | Status: AC
  Administered 2021-01-14: 3 mL via INTRAVENOUS

## 2021-01-14 MED ORDER — LORAZEPAM 2 MG/ML IJ SOLN
1.0000 mg | Freq: Once | INTRAMUSCULAR | Status: AC
  Administered 2021-01-14: 1 mg via INTRAVENOUS
  Filled 2021-01-14: qty 1

## 2021-01-14 MED ORDER — GABAPENTIN 100 MG PO CAPS
200.0000 mg | ORAL_CAPSULE | Freq: Every day | ORAL | Status: DC
  Administered 2021-01-14: 200 mg via ORAL
  Filled 2021-01-14: qty 2

## 2021-01-14 MED ORDER — HYDROMORPHONE HCL 1 MG/ML IJ SOLN
0.5000 mg | Freq: Once | INTRAMUSCULAR | Status: AC
  Administered 2021-01-14: 0.5 mg via INTRAVENOUS
  Filled 2021-01-14: qty 1

## 2021-01-14 NOTE — ED Triage Notes (Signed)
[place] here from church.  Reports L sided facial droop and L eye blurred vision that started at 10:45am while singing and lasted approx 6 min.  Symptoms have resolved.  Speech clear.  No arm drift.  VAN negative.

## 2021-01-14 NOTE — ED Provider Notes (Signed)
[facility] EMERGENCY DEPARTMENT Provider Note   CSN: [id number] Arrival date & time: 01/14/21  1126     History Chief Complaint  Patient presents with   Facial Droop    Kenneth Rawl Sr. is a 70 y.o. male.  HPI 70 year old male presents with transient left-sided facial droop and blurry vision.  This started around 10:45 AM when he was in the church choir.  He all of a sudden felt like his left face was pulling down and it felt abnormal.  He started to get blurry vision that is not sure if it was 1 eye or both but it seemed to be lateral left blurry vision.  However he states his eye was tearing and is not sure if that caused the blurry vision.  Overall this lasted about 45 minutes.  His wife noted that he was having trouble getting the right words out but was not slurring his speech.  Right now he has a slight headache but this is way better than typical headaches. No unilateral extremity weakness or numbness. Has chronic chest pain that's unchanged.   Past Medical History:  Diagnosis Date   Arthritis    "left thumb" (05/23/2017) right shoulder   Barrett's esophagus    "we've been told that it's all gone; still take RX for GERD" (05/23/2017)   Bilateral swelling of feet and ankles x 3 weeks as of 12-01-2019   CAD (coronary artery disease), native coronary artery    Coronary angiogram 05/03/2014:  Proximal LAD 3.0x12 mm Promus premier stent.  04/08/2014: Mid Cx 3.5 x 16 mm Promus DES.  03/29/2013: Mid LAD 2.75 x 38 mm Promus Premier drug-eluting stent, balloon angioplasty of D1 and distal LAD and stenting of distal RCA with 2.75 x 24 mm promos Premier drug-eluting stent 12/15/2012 widely patent.   Cervicalgia    Chronic bronchitis (HCC)    "q yr" (05/23/2017)   Complication of anesthesia    " I do not wake up very well "   COVID-19 05/2019   all covid symptoms in hospital x 5 days, all symptoms resolved took monoclonal antibody tx    Family history of adverse reaction to  anesthesia    "son w/PONV"   GERD (gastroesophageal reflux disease)    Heart murmur    "grew out of it"    Hyperlipidemia    Hypoglycemia, unspecified    IDDM (insulin dependent diabetes mellitus)    type 2   Impotence of organic origin    Iron deficiency anemia    Other malaise and fatigue    Pneumonia 05/2019   PONV (postoperative nausea and vomiting)    after wisdom teeth pulled   Restless leg    Rotator cuff arthropathy    Tension headache    "sometimes" (05/23/2017)   Unspecified gastritis and gastroduodenitis with hemorrhage    Unstable angina pectoris (HCC) 04/08/2014   Coronary angiogram 05/03/2014:  Proximal LAD 3.0x12 mm Promus premier stent.  04/08/2014: Mid Cx 3.5 x 16 mm Promus DES.  03/29/2013: Mid LAD 2.75 x 38 mm Promus Premier drug-eluting stent, balloon angioplasty of D1 and distal LAD and stenting of distal RCA with 2.75 x 24 mm promos Premier drug-eluting stent 12/15/2012 widely patent.    Patient Active Problem List   Diagnosis Date Noted   TIA (transient ischemic attack) 01/14/2021   Mild pulmonary hypertension (HCC) 03/06/2020   Hoarseness 05/17/2019   Gastritis with hemorrhage 04/27/2019   Diabetes mellitus type 2, uncontrolled, with complications 04/27/2019  HLD (hyperlipidemia) 04/27/2019   Restless leg syndrome 04/27/2019   Chronic iron deficiency anemia 04/27/2019   Barrett's esophagus with esophagitis 04/27/2019   Pneumonia due to COVID-19 virus 04/27/2019   HCAP (healthcare-associated pneumonia) 04/27/2019   Severe sepsis ([place]) 04/27/2019   Severe sepsis with septic shock ([place]) 04/27/2019   Acute lower UTI 04/27/2019   Sepsis due to pneumonia ([place]) 04/26/2019   Mild renal insufficiency 04/26/2019   Hyponatremia 04/26/2019   Thrombocytopenia ([place]) 04/26/2019   COVID-19 virus infection 04/14/2019   Hypokalemia 04/14/2019   Diabetes mellitus type 2, uncontrolled 04/14/2019   Gastroesophageal reflux disease without esophagitis 04/14/2019   Simple  chronic bronchitis ([place]) 10/09/2017   Small airways disease 01/04/2017   TSH elevation 07/05/2015   Plantar fasciitis 04/26/2015   Tremor 02/14/2015   Voice disorder 10/19/2014   Unstable angina (HCC) 04/08/2014   Anemia, iron deficiency 09/22/2013   Shortness of breath 08/31/2013   Achilles tendon pain 06/16/2013   CAD (coronary artery disease), native coronary artery 12/15/2012   Impotence of organic origin [id number]   Umbilical hernia without mention of obstruction or gangrene 06/16/2012   Barrett's esophagus 06/16/2012   Gastritis and gastroduodenitis with hemorrhage 06/16/2012   Hyperlipidemia 06/16/2012   Cholelithiasis 06/16/2012   Slowing of urinary stream 06/16/2012   Pain in joint, lower leg 06/16/2012   Essential hypertension 06/16/2012   Restless legs syndrome (RLS) 06/16/2012   Hypoglycemia 06/16/2012   Tension headache 06/16/2012    Past Surgical History:  Procedure Laterality Date   BICEPT TENODESIS Right 12/07/2019   Procedure: RIGHT BICEPS TENODESIS;  Surgeon: Renette Butters, MD;  Location: [place];  Service: Orthopedics;  Laterality: Right;   CARDIAC CATHETERIZATION N/A 09/25/2015   Procedure: Left Heart Cath and Coronary Angiography;  Surgeon: Adrian Prows, MD;  Location: [place] [facility];  Service: Cardiovascular;  Laterality: N/A;   CARDIAC CATHETERIZATION N/A 09/25/2015   Procedure: Intravascular Pressure Wire/FFR Study;  Surgeon: Adrian Prows, MD;  Location: [place] [facility];  Service: Cardiovascular;  Laterality: N/A;   CORONARY ANGIOPLASTY WITH STENT PLACEMENT  12/15/2012; 04/28/2014; 05/03/2014   "2; 1; 1" , 4 stents 2 balloons   FRACTIONAL FLOW RESERVE WIRE Right 03/26/2013   Procedure: FRACTIONAL FLOW RESERVE WIRE;  Surgeon: Laverda Page, MD;  Location: [facility] CATH LAB;  Service: Cardiovascular;  Laterality: Right;   FRACTIONAL FLOW RESERVE WIRE N/A 05/03/2014   Procedure: FRACTIONAL FLOW RESERVE WIRE;  Surgeon: Laverda Page, MD;   Location: [facility] CATH LAB;  Service: Cardiovascular;  Laterality: N/A;   HERNIA REPAIR  [id number]   "umbilical"    INCISION AND DRAINAGE ABSCESS Left 05/2007   "groin"    KNEE SURGERY Right 1992;  2000   "calcium deposits removed" (12/15/2012)   LEFT HEART CATH AND CORONARY ANGIOGRAPHY N/A 05/23/2017   Procedure: LEFT HEART CATH AND CORONARY ANGIOGRAPHY;  Surgeon: Adrian Prows, MD;  Location: [place] [facility];  Service: Cardiovascular;  Laterality: N/A;   LEFT HEART CATHETERIZATION WITH CORONARY ANGIOGRAM N/A 12/15/2012   Procedure: LEFT HEART CATHETERIZATION WITH CORONARY ANGIOGRAM;  Surgeon: Laverda Page, MD;  Location: [facility] CATH LAB;  Service: Cardiovascular;  Laterality: N/A;   LEFT HEART CATHETERIZATION WITH CORONARY ANGIOGRAM N/A 03/26/2013   Procedure: LEFT HEART CATHETERIZATION WITH CORONARY ANGIOGRAM;  Surgeon: Laverda Page, MD;  Location: [facility] CATH LAB;  Service: Cardiovascular;  Laterality: N/A;   LEFT HEART CATHETERIZATION WITH CORONARY ANGIOGRAM N/A 04/08/2014   Procedure: LEFT HEART CATHETERIZATION WITH CORONARY ANGIOGRAM;  Surgeon: Blane Ohara, MD;  Location: [facility] CATH LAB;  Service: Cardiovascular;  Laterality: N/A;   PERCUTANEOUS CORONARY STENT INTERVENTION (PCI-S) N/A 05/03/2014   Procedure: PERCUTANEOUS CORONARY STENT INTERVENTION (PCI-S);  Surgeon: Laverda Page, MD;  Location: [facility] CATH LAB;  Service: Cardiovascular;  Laterality: N/A;   ROTATOR CUFF REPAIR     UMBILICAL HERNIA REPAIR  yrs ago       Family History  Adopted: Yes  Problem Relation Age of Onset   Emphysema Mother     Social History   Tobacco Use   Smoking status: Never   Smokeless tobacco: Never  Vaping Use   Vaping Use: Never used  Substance Use Topics   Alcohol use: No   Drug use: No    Home Medications Prior to Admission medications   Medication Sig Start Date End Date Taking? Authorizing Provider  albuterol (PROVENTIL HFA;VENTOLIN HFA) 108 (90 Base) MCG/ACT inhaler Inhale 2 puffs into the  lungs every 6 (six) hours as needed for wheezing or shortness of breath. 06/08/18  Yes Reed, Tiffany L, DO  Alirocumab (PRALUENT) 150 MG/ML SOAJ Inject 1 pen into the skin every 14 (fourteen) days. 12/01/19  Yes Adrian Prows, MD  amLODipine (NORVASC) 5 MG tablet Take 1 tablet (5 mg total) by mouth daily. 01/01/21 05/01/21 Yes Cantwell, Celeste C, PA-C  aspirin 81 MG EC tablet Take 1 tablet (81 mg total) by mouth daily. 12/16/12  Yes Adrian Prows, MD  D-5000 125 MCG (5000 UT) TABS TAKE [place] Patient taking differently: Take 5,000 Units by mouth daily. 02/03/20  Yes Reed, Tiffany L, DO  dapagliflozin propanediol (FARXIGA) 10 MG TABS tablet Take 1 tablet (10 mg total) by mouth daily before breakfast. 12/25/20  Yes Renato Shin, MD  famotidine (PEPCID) 20 MG tablet TAKE 1 TABLET BY MOUTH EVERY DAY Patient taking differently: Take 20 mg by mouth daily. 07/06/19  Yes Reed, Tiffany L, DO  fluticasone (FLONASE) 50 MCG/ACT nasal spray Place 1 spray into both nostrils daily as needed for allergies or rhinitis.   Yes [provider]  gabapentin (NEURONTIN) 100 MG capsule Take 2 capsules (200 mg total) by mouth at bedtime. 09/05/20  Yes Lauree Chandler, NP  glucose 4 GM chewable tablet Chew 1 tablet (4 g total) by mouth as needed for low blood sugar. 07/28/20  Yes [place], Amy E, NP  insulin aspart (NOVOLOG FLEXPEN) 100 UNIT/ML FlexPen Inject 15-20 Units into the skin 3 (three) times daily with meals. 09/14/20  Yes Renato Shin, MD  Insulin Glargine [facility]) 100 UNIT/ML Inject 40 units subcutaneously into the skin at bedtime 07/28/20  Yes Lauree Chandler, NP  metoprolol succinate (TOPROL-XL) 25 MG 24 hr tablet Take 1 tablet (25 mg total) by mouth daily. Take with or immediately following a meal. 01/01/21 05/01/21 Yes Cantwell, Celeste C, PA-C  nitroGLYCERIN (NITROSTAT) 0.4 MG SL tablet Place 1 tablet (0.4 mg total) under the tongue every 5 (five) minutes as needed for chest pain.  10/04/20  Yes Lauree Chandler, NP  pantoprazole (PROTONIX) 40 MG tablet Take one tablet by mouth once daily for stomach Patient taking differently: Take 40 mg by mouth daily. 09/25/20  Yes Lauree Chandler, NP  pramipexole (MIRAPEX) 1 MG tablet TAKE 1 TABLET BY MOUTH AT 5PM, 1 TABLET BY MOUTH AT 7PM, AND TAKE 2 TABLETS BY MOUTH AT BEDTIME. Patient taking differently: Take 1-2 mg by mouth See admin instructions. Take 1mg  at 5pm, 1mg  at 7pm,  and 2mg  at bedtime. 11/23/20  Yes Lauree Chandler, NP  valsartan (DIOVAN) 160 MG tablet Take 1 tablet (160 mg total) by mouth every evening. 06/02/20  Yes Adrian Prows, MD  BD PEN NEEDLE NANO 2ND GEN 32G X 4 MM MISC USE AS DIRECTED 04/12/20   Reed, Tiffany L, DO  Continuous Blood Gluc Receiver (FREESTYLE LIBRE 14 DAY READER) DEVI Inject 1 Device as directed daily as needed. Use once daily as direct to check blood sugar E11.51 05/03/19   Reed, Tiffany L, DO  Continuous Blood Gluc Sensor (FREESTYLE LIBRE 14 DAY SENSOR) MISC Use to test blood sugar three times daily. E11.51. E11.59 08/29/20   Lauree Chandler, NP  [facility] FINEPOINT LANCETS MISC Use to test for blood sugar three times daily dx: E11.51 06/01/18   Lauree Chandler, NP  [facility] ULTRA test strip USE TO TEST FOR BLOOD SUGAR THREE TIMES DAILY DX: E11.51 12/04/18   Reed, Tiffany L, DO    Allergies    Lyrica [pregabalin], Statins, and Trulicity [dulaglutide]  Review of Systems   Review of Systems  Constitutional:  Negative for fever.  Eyes:  Positive for visual disturbance.  Cardiovascular:  Positive for chest pain.  Neurological:  Positive for speech difficulty and headaches. Negative for weakness and numbness.  All other systems reviewed and are negative.  Physical Exam Updated Vital Signs BP (!) 150/88   Pulse 65   Temp 97.6 F (36.4 C)   Resp (!) 21   SpO2 96%   Physical Exam Vitals and nursing note reviewed.  Constitutional:      Appearance: He is well-developed.  HENT:     Head:  Normocephalic and atraumatic.     Right Ear: External ear normal.     Left Ear: External ear normal.     Nose: Nose normal.  Eyes:     General:        Right eye: No discharge.        Left eye: No discharge.     Extraocular Movements: Extraocular movements intact.     Pupils: Pupils are equal, round, and reactive to light.  Cardiovascular:     Rate and Rhythm: Normal rate and regular rhythm.     Heart sounds: Normal heart sounds.  Pulmonary:     Effort: Pulmonary effort is normal.     Breath sounds: Normal breath sounds.  Abdominal:     Palpations: Abdomen is soft.     Tenderness: There is no abdominal tenderness.  Musculoskeletal:     Cervical back: Neck supple.  Skin:    General: Skin is warm and dry.  Neurological:     Mental Status: He is alert.     Comments: CN 3-12 grossly intact. 5/5 strength in all 4 extremities. Grossly normal sensation. Normal finger to nose.  Bilateral twitching in lower extremities consistent with restless leg  Psychiatric:        Mood and Affect: Mood is not anxious.    ED Results / Procedures / Treatments   Labs (all labs ordered are listed, but only abnormal results are displayed) Labs Reviewed  CBC - Abnormal; Notable for the following components:      Result Value   Platelets 131 (*)    All other components within normal limits  COMPREHENSIVE METABOLIC PANEL - Abnormal; Notable for the following components:   Glucose, Bld 180 (*)    Creatinine, Ser 1.36 (*)    GFR, Estimated 56 (*)    All other components  within normal limits  I-STAT CHEM 8, ED - Abnormal; Notable for the following components:   Glucose, Bld 184 (*)    All other components within normal limits  RESP PANEL BY RT-PCR (FLU A&B, COVID) [id number]  PROTIME-INR  APTT  DIFFERENTIAL  LIPID PANEL  HEMOGLOBIN A1C  CBG MONITORING, ED    EKG EKG Interpretation  Date/Time:  Sunday January 14 2021 15:27:42 EST Ventricular Rate:  64 PR Interval:  165 QRS Duration: 103 QT  Interval:  448 QTC Calculation: 463 R Axis:   -21 Text Interpretation: Sinus rhythm Low voltage, precordial leads Left ventricular hypertrophy Borderline T abnormalities, inferior leads Confirmed by Sherwood Gambler [phone]) on 01/14/2021 4:09:01 PM  Radiology CT HEAD WO CONTRAST  Result Date: 01/14/2021 CLINICAL DATA:  Neuro deficit, acute, stroke suspected EXAM: CT HEAD WITHOUT CONTRAST TECHNIQUE: Contiguous axial images were obtained from the base of the skull through the vertex without intravenous contrast. COMPARISON:  None. FINDINGS: Brain: No evidence of acute infarction, hemorrhage, hydrocephalus, extra-axial collection or mass lesion/mass effect. Vascular: Vascular calcifications of the carotid siphons. Skull: Normal. Negative for fracture or focal lesion. Sinuses/Orbits: No acute finding. Other: None. IMPRESSION: No acute intracranial abnormality. Electronically Signed   By: Valentino Saxon M.D.   On: 01/14/2021 13:00    Procedures Procedures   Medications Ordered in ED Medications  pramipexole (MIRAPEX) tablet 1 mg (has no administration in time range)   stroke: mapping our early stages of recovery book (has no administration in time range)  0.9 %  sodium chloride infusion (has no administration in time range)  acetaminophen (TYLENOL) tablet 650 mg (has no administration in time range)    Or  acetaminophen (TYLENOL) 160 MG/5ML solution 650 mg (has no administration in time range)    Or  acetaminophen (TYLENOL) suppository 650 mg (has no administration in time range)  enoxaparin (LOVENOX) injection 40 mg (has no administration in time range)  aspirin suppository 300 mg (has no administration in time range)    Or  aspirin tablet 325 mg (has no administration in time range)  HYDROmorphone (DILAUDID) injection 0.5 mg (has no administration in time range)  sodium chloride flush (NS) 0.9 % injection 3 mL (3 mLs Intravenous Given 01/14/21 1626)  LORazepam (ATIVAN) injection 1 mg (1  mg Intravenous Given 01/14/21 1626)    ED Course  I have reviewed the triage vital signs and the nursing notes.  Pertinent labs & imaging results that were available during my care of the patient were reviewed by me and considered in my medical decision making (see chart for details).    MDM Rules/Calculators/A&P                           Patient's presentation is concerning for a TIA.  He has multiple risk factors for stroke.  Discussed with Dr. Curly Sh[place], who will consult and agrees with admission for TIA work-up.  Dr. Tamala Julian will admit.  I ordered the CTA head and neck per neurology as well as the MRI. Final Clinical Impression(s) / ED Diagnoses Final diagnoses:  TIA (transient ischemic attack)    Rx / DC Orders ED Discharge Orders     None        Sherwood Gambler, MD 01/14/21 1645

## 2021-01-14 NOTE — ED Notes (Signed)
Assumed care pt at 1545.

## 2021-01-14 NOTE — ED Notes (Signed)
Pt currently in MRI

## 2021-01-14 NOTE — ED Notes (Signed)
Pt unable to have MRI due to his restless legs

## 2021-01-14 NOTE — ED Notes (Signed)
MRI called that pt is ready

## 2021-01-14 NOTE — Consult Note (Addendum)
Neurology Consultation Reason for Consult: left facial droop and blurry vision Referring Physician: Dr. Regenia Skeeter   CC: left facial droop and blurry vision  History is obtained from:patient and medical record  HPI: Melvyn Deren Sr. is a 70 y.o. male with past medical history of HTN (statin intolerance), HLD, CAD status post PCI, Uncontrolled DM 2, CKD, Barrett's esophagus and severe RLS who presents to the ED after experiencing transient left facial droop and blurry vision while at church singing which lasted about 45 minutes and resolved.    LKW: 1045 tpa given?: no, symptoms resolved  Premorbid modified rankin scale: 0  ROS: A 14 point ROS was performed and is negative except as noted in the HPI.    Past Medical History:  Diagnosis Date   Arthritis    "left thumb" (05/23/2017) right shoulder   Barrett's esophagus    "we've been told that it's all gone; still take RX for GERD" (05/23/2017)   Bilateral swelling of feet and ankles x 3 weeks as of 12-01-2019   CAD (coronary artery disease), native coronary artery    Coronary angiogram 05/03/2014:  Proximal LAD 3.0x12 mm Promus premier stent.  04/08/2014: Mid Cx 3.5 x 16 mm Promus DES.  03/29/2013: Mid LAD 2.75 x 38 mm Promus Premier drug-eluting stent, balloon angioplasty of D1 and distal LAD and stenting of distal RCA with 2.75 x 24 mm promos Premier drug-eluting stent 12/15/2012 widely patent.   Cervicalgia    Chronic bronchitis ([place])    "q yr" ([id number])   Complication of anesthesia    " I do not wake up very well "   COVID-19 05/2019   all covid symptoms in hospital x 5 days, all symptoms resolved took monoclonal antibody tx    Family history of adverse reaction to anesthesia    "son w/PONV"   GERD (gastroesophageal reflux disease)    Heart murmur    "grew out of it"    Hyperlipidemia    Hypoglycemia, unspecified    IDDM (insulin dependent diabetes mellitus)    type 2   Impotence of organic origin    Iron deficiency anemia     Other malaise and fatigue    Pneumonia 05/2019   PONV (postoperative nausea and vomiting)    after wisdom teeth pulled   Restless leg    Rotator cuff arthropathy    Tension headache    "sometimes" (05/23/2017)   Unspecified gastritis and gastroduodenitis with hemorrhage    Unstable angina pectoris ([place]) 04/08/2014   Coronary angiogram 05/03/2014:  Proximal LAD 3.0x12 mm Promus premier stent.  04/08/2014: Mid Cx 3.5 x 16 mm Promus DES.  03/29/2013: Mid LAD 2.75 x 38 mm Promus Premier drug-eluting stent, balloon angioplasty of D1 and distal LAD and stenting of distal RCA with 2.75 x 24 mm promos Premier drug-eluting stent 12/15/2012 widely patent.   Past Surgical History:  Procedure Laterality Date   BICEPT TENODESIS Right 12/07/2019   Procedure: RIGHT BICEPS TENODESIS;  Surgeon: Renette Butters, MD;  [facility];  Service: Orthopedics;  Laterality: Right;   CARDIAC CATHETERIZATION N/A 09/25/2015   Procedure: Left Heart Cath and Coronary Angiography;  Surgeon: Adrian Prows, MD;  Location: [facility];  Service: Cardiovascular;  Laterality: N/A;   CARDIAC CATHETERIZATION N/A 09/25/2015   Procedure: Intravascular Pressure Wire/FFR Study;  Surgeon: Adrian Prows, MD;  Location: [place] [facility];  Service: Cardiovascular;  Laterality: N/A;   CORONARY ANGIOPLASTY WITH STENT PLACEMENT  12/15/2012; 04/28/2014; 05/03/2014   "  2; 1; 1" , 4 stents 2 balloons   FRACTIONAL FLOW RESERVE WIRE Right 03/26/2013   Procedure: FRACTIONAL FLOW RESERVE WIRE;  Surgeon: Laverda Page, MD;  Location: [facility] CATH LAB;  Service: Cardiovascular;  Laterality: Right;   FRACTIONAL FLOW RESERVE WIRE N/A 05/03/2014   Procedure: FRACTIONAL FLOW RESERVE WIRE;  Surgeon: Laverda Page, MD;  Location: [facility] C[facility];  Service: Cardiovascular;  Laterality: N/A;   HERNIA REPAIR  [id number]   "umbilical"    INCISION AND DRAINAGE ABSCESS Left 05/2007   "groin"    KNEE SURGERY Right 1992;  2000   "calcium deposits removed"  (12/15/2012)   LEFT HEART CATH AND CORONARY ANGIOGRAPHY N/A 05/23/2017   Procedure: LEFT HEART CATH AND CORONARY ANGIOGRAPHY;  Surgeon: Adrian Prows, MD;  Location: [place];  Service: Cardiovascular;  Laterality: N/A;   LEFT HEART CATHETERIZATION WITH CORONARY ANGIOGRAM N/A 12/15/2012   Procedure: LEFT HEART CATHETERIZATION WITH CORONARY ANGIOGRAM;  Surgeon: Laverda Page, MD;  Location: [facility] CATH LAB;  Service: Cardiovascular;  Laterality: N/A;   LEFT HEART CATHETERIZATION WITH CORONARY ANGIOGRAM N/A 03/26/2013   Procedure: LEFT HEART CATHETERIZATION WITH CORONARY ANGIOGRAM;  Surgeon: Laverda Page, MD;  Location: [facility] CATH LAB;  Service: Cardiovascular;  Laterality: N/A;   LEFT HEART CATHETERIZATION WITH CORONARY ANGIOGRAM N/A 04/08/2014   Procedure: LEFT HEART CATHETERIZATION WITH CORONARY ANGIOGRAM;  Surgeon: Blane Ohara, MD;  Location: [facility] CATH LAB;  Service: Cardiovascular;  Laterality: N/A;   PERCUTANEOUS CORONARY STENT INTERVENTION (PCI-S) N/A 05/03/2014   Procedure: PERCUTANEOUS CORONARY STENT INTERVENTION (PCI-S);  Surgeon: Laverda Page, MD;  Location: [facility] CATH LAB;  Service: Cardiovascular;  Laterality: N/A;   ROTATOR CUFF REPAIR     UMBILICAL HERNIA REPAIR  yrs ago    Current Facility-Administered Medications:    0.9 %  sodium chloride infusion, , Intravenous, Once, Smith, Rondell A, MD   acetaminophen (TYLENOL) tablet 650 mg, 650 mg, Oral, Q4H PRN **OR** acetaminophen (TYLENOL) 160 MG/5ML solution 650 mg, 650 mg, Per Tube, Q4H PRN **OR** acetaminophen (TYLENOL) suppository 650 mg, 650 mg, Rectal, Q4H PRN, Tamala Julian, Rondell A, MD   aspirin suppository 300 mg, 300 mg, Rectal, Daily, 300 mg at 01/14/21 1726 **OR** aspirin tablet 325 mg, 325 mg, Oral, Daily, Smith, Rondell A, MD   Basaglar KwikPen KwikPen 40 Units, 40 Units, Subcutaneous, Q2200, Smith, Rondell A, MD   enoxaparin (LOVENOX) injection 40 mg, 40 mg, Subcutaneous, Q24H, Smith, Rondell A, MD, 40 mg at 01/14/21  1717   fluticasone (FLONASE) 50 MCG/ACT nasal spray 1 spray, 1 spray, Each Nare, Daily PRN, Tamala Julian, Rondell A, MD   gabapentin (NEURONTIN) capsule 200 mg, 200 mg, Oral, QHS, Smith, Rondell A, MD   [START ON 01/15/2021] pantoprazole (PROTONIX) EC tablet 40 mg, 40 mg, Oral, Daily, Smith, Rondell A, MD   pramipexole (MIRAPEX) tablet 1 mg, 1 mg, Oral, BID, Smith, Rondell A, MD   pramipexole (MIRAPEX) tablet 2 mg, 2 mg, Oral, QHS, Smith, Rondell A, MD  Current Outpatient Medications:    albuterol (PROVENTIL HFA;VENTOLIN HFA) 108 (90 Base) MCG/ACT inhaler, Inhale 2 puffs into the lungs every 6 (six) hours as needed for wheezing or shortness of breath., Disp: 18 g, Rfl: 1   Alirocumab (PRALUENT) 150 MG/ML SOAJ, Inject 1 pen into the skin every 14 (fourteen) days., Disp: 6 mL, Rfl: 3   amLODipine (NORVASC) 5 MG tablet, Take 1 tablet (5 mg total) by mouth daily., Disp: 30 tablet, Rfl: 3   aspirin 81 MG  EC tablet, Take 1 tablet (81 mg total) by mouth daily., Disp: 60 tablet, Rfl: 0   D-5000 125 MCG (5000 UT) TABS, TAKE 1 TABLET BY MOUTH EVERY DAY (Patient taking differently: Take 5,000 Units by mouth daily.), Disp: 90 tablet, Rfl: 1   dapagliflozin propanediol (FARXIGA) 10 MG TABS tablet, Take 1 tablet (10 mg total) by mouth daily before breakfast., Disp: 90 tablet, Rfl: 3   famotidine (PEPCID) 20 MG tablet, TAKE 1 TABLET BY MOUTH EVERY DAY (Patient taking differently: Take 20 mg by mouth daily.), Disp: 30 tablet, Rfl: 1   fluticasone (FLONASE) 50 MCG/ACT nasal spray, Place 1 spray into both nostrils daily as needed for allergies or rhinitis., Disp: , Rfl:    gabapentin (NEURONTIN) 100 MG capsule, Take 2 capsules (200 mg total) by mouth at bedtime., Disp: 180 capsule, Rfl: 1   glucose 4 GM chewable tablet, Chew 1 tablet (4 g total) by mouth as needed for low blood sugar., Disp: 50 tablet, Rfl: 12   insulin aspart (NOVOLOG FLEXPEN) 100 UNIT/ML FlexPen, Inject 15-20 Units into the skin 3 (three) times daily  with meals., Disp: 60 mL, Rfl: 3   Insulin Glargine (BASAGLAR KWIKPEN) 100 UNIT/ML, Inject 40 units subcutaneously into the skin at bedtime, Disp: 45 mL, Rfl: 4   metoprolol succinate (TOPROL-XL) 25 MG 24 hr tablet, Take 1 tablet (25 mg total) by mouth daily. Take with or immediately following a meal., Disp: 30 tablet, Rfl: 3   nitroGLYCERIN (NITROSTAT) 0.4 MG SL tablet, Place 1 tablet (0.4 mg total) under the tongue every 5 (five) minutes as needed for chest pain., Disp: 50 tablet, Rfl: 0   pantoprazole (PROTONIX) 40 MG tablet, Take one tablet by mouth once daily for stomach (Patient taking differently: Take 40 mg by mouth daily.), Disp: 90 tablet, Rfl: 1   pramipexole (MIRAPEX) 1 MG tablet, TAKE 1 TABLET BY MOUTH AT 5PM, 1 TABLET BY MOUTH AT 7PM, AND TAKE 2 TABLETS BY MOUTH AT BEDTIME. (Patient taking differently: Take 1-2 mg by mouth See admin instructions. Take 1mg  at 5pm, 1mg  at 7pm, and 2mg  at bedtime.), Disp: 360 tablet, Rfl: 1   valsartan (DIOVAN) 160 MG tablet, Take 1 tablet (160 mg total) by mouth every evening., Disp: 90 tablet, Rfl: 3   BD PEN NEEDLE NANO 2ND GEN 32G X 4 MM MISC, USE AS DIRECTED, Disp: 100 each, Rfl: 3   Continuous Blood Gluc Receiver (FREESTYLE LIBRE 14 DAY READER) DEVI, Inject 1 Device as directed daily as needed. Use once daily as direct to check blood sugar E11.51, Disp: 1 each, Rfl: 0   Continuous Blood Gluc Sensor (FREESTYLE LIBRE 14 DAY SENSOR) MISC, Use to test blood sugar three times daily. E11.51. E11.59, Disp: 2 each, Rfl: 12   LIFESCAN FINEPOINT LANCETS MISC, Use to test for blood sugar three times daily dx: E11.51, Disp: 300 each, Rfl: 1   ONETOUCH ULTRA test strip, USE TO TEST FOR BLOOD SUGAR THREE TIMES DAILY DX: E11.51, Disp: 300 strip, Rfl: 11   Family History  Adopted: Yes  Problem Relation Age of Onset   Emphysema Mother      Social History:  reports that he has never smoked. He has never used smokeless tobacco. He reports that he does not drink  alcohol and does not use drugs.   Exam: Current vital signs: BP (!) 150/88   Pulse 65   Temp 97.6 F (36.4 C)   Resp (!) 21   SpO2 96%  Vital signs in  last 24 hours: Temp:  [97.6 F (36.4 C)-98.4 F (36.9 C)] 97.6 F (36.4 C) (11/13 1316) Pulse Rate:  [59-68] 65 (11/13 1545) Resp:  [16-21] 21 (11/13 1545) BP: (120-154)/(64-88) 150/88 (11/13 1545) SpO2:  [94 %-99 %] 96 % (11/13 1545)   Physical Exam  Constitutional: Appears well-developed and well-nourished.  Psych: Affect appropriate to situation Eyes: No scleral injection HENT: No OP obstruction MSK: no joint deformities. Multiple twitching in bilateral legs related to RLS Cardiovascular: Normal rate and regular rhythm.  Respiratory: Effort normal, non-labored breathing GI: Soft.  No distension. There is no tenderness.  Skin: WDI  Neuro: Mental Status: Patient is awake, alert, oriented to person, place, month, year, and situation. Patient is able to give a clear and coherent history. No signs of aphasia or neglect Cranial Nerves: II: Visual Fields are full. Pupils are equal, round, and reactive to light.   III,IV, VI: EOMI without ptosis or diploplia.  V: Facial sensation is symmetric to temperature VII: Facial movement is symmetric.  VIII: hearing is intact to voice X: Uvula elevates symmetrically XI: Shoulder shrug is symmetric. XII: tongue is midline without atrophy or fasciculations.  Motor: Tone is normal. Bulk is normal. 5/5 strength was present in all four extremities.  Sensory: Sensation is symmetric to light touch and temperature in the arms and legs. Cerebellar: FNF and HKS are intact bilaterally  1a Level of Conscious.: 0 1b LOC Questions: 0 1c LOC Commands: 0 2 Best Gaze: 0 3 Visual: 0 4 Facial Palsy: 0 5a Motor Arm - left: 0 5b Motor Arm - Right: 0 6a Motor Leg - Left: 0 6b Motor Leg - Right: 0 7 Limb Ataxia: 0 8 Sensory: 0 9 Best Language: 0 10 Dysarthria: 0 11 Extinct. and Inatten.:  0 TOTAL: 0   I have reviewed the images obtained: CT head no acute abnormality   Echocardiogram 01/04/2021:  Normal LV systolic function with visual EF 60-65%. Left ventricle cavity  is normal in size. Mild left ventricular hypertrophy. Normal global wall  motion. Normal diastolic filling pattern, normal LAP.  Mild (Grade I) aortic regurgitation.  Mild tricuspid regurgitation. No evidence of pulmonary hypertension.  Compared to study 06/22/2020 MR is now resolved otherwise no significant  change.     Impression:  Forbes Loll Sr. is a 70 y.o. male with past medical history of HTN (statin intolerance), HLD, CAD status post PCI, Uncontrolled DM 2, CKD, Barrett's esophagus and severe RLS who presents to the ED after experiencing transient left facial droop and blurry vision while at church singing which lasted about 45 minutes and resolved.   TIA vs acute right hemispheric stroke    Recommendations:  #Stroke versus TIA - admit to medicine - HgbA1c, fasting lipid panel - MRI/MRA of the brain without contrast, MRA neck w/ and w/o - Frequent neuro checks - Echo- most recent 01/04/2021, no need to repeat in the absence of new cardiac symptoms - Prophylactic therapy-Antiplatelet med: Aspirin - 81 mg daily (continue home medication)  - Plavix 300 mg loading dose followed by 75 mg daily course TBD based on vessel imaging - Risk factor modification - Telemetry monitoring - PT consult, OT consult, Speech consult - Stroke team to follow   #RLS -Attending MD discussed with wife that given the doses of pramipexole that he is on he is likely experiencing augmentation as he has had escalating symptoms and escalating medication usage.  Discussed that withdrawal should be managed by an outpatient neurologist  Gevena Mart, NP  Attending Neurologist's note:  I personally saw this patient, gathering history, performing a full neurologic examination, reviewing relevant labs, personally reviewing  relevant imaging including Head CT, and formulated the assessment and plan, adding the note above for completeness and clarity to accurately reflect my thoughts  Lesleigh Noe MD-PhD [facility] [phone]  Available 7 AM to 7 PM, outside these hours please contact Neurologist on call listed on AMION

## 2021-01-14 NOTE — ED Provider Notes (Signed)
Emergency Medicine Provider Triage Evaluation Note  Kenneth Whitner Sr. , a 70 y.o. male  was evaluated in triage.  Pt complains of a facial droop states that this morning in church he felt as though he had a left-sided facial droop.  At that time he felt a little bit confused as well and has had some visual disturbances on the left side.  History of multiple stents, no history of CVA.  No history of A. fib and is not on any blood thinners to his knowledge.  Review of Systems  Positive:  Negative: Chest pain, headache or dizziness.  Believes that symptoms have resolved.  Physical Exam  There were no vitals taken for this visit. Gen:   Awake, no distress   Resp:  Normal effort  MSK:   Moves extremities without difficulty  Other:  NIH negative.  5 out of 5 strength in upper and lower limbs bilaterally.  PERRLA.  No focal deficits.  Medical Decision Making  Medically screening exam initiated at 11:37 AM.  Appropriate orders placed.  Kenneth Sic Sr. was informed that the remainder of the evaluation will be completed by another provider, this initial triage assessment does not replace that evaluation, and the importance of remaining in the ED until their evaluation is complete.     Saddie Benders, PA-C 01/14/21 1139    Gloris Manchester, MD 01/15/21 0001

## 2021-01-14 NOTE — ED Notes (Signed)
Report given to Christina RN

## 2021-01-14 NOTE — H&P (Signed)
History and Physical    Kenneth Vasbinder Sr. [id number] DOB: [birth year] DOA: 01/14/2021  Referring MD/NP/PA: Sherwood Gambler, MD PCP: Lauree Chandler, NP  Patient coming from: Chart  Chief Complaint: Left-sided facial droop with blurry vision  I have personally briefly reviewed patient's old medical records in [place]   HPI: Kenneth Reedus Sr. is a 70 y.o. male with medical history significant of hyperlipidemia, CAD status post PCI, diabetes mellitus type 2, tension headaches, RLS, and are left CKD stage III who presents with complaints of left-sided facial droop with blurry vision.  He had woken up this morning in his normal state of health.  While singing on the choir at church this morning around 9:45 -10AM he reported feeling the left side of his face drooping.  Complained of having difficulty singing the words stating that his speech was slurred.  Also noted that his vision became blurry out of his left eye.  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  Patient denied having any headache, chest pain, palpitations, cough, shortness of breath, nausea, vomiting, diarrhea, or recent sick contacts to his knowledge.  Symptoms lasted approximately 45 minutes before seeming to resolve.  Patient reports that after having COVID-19 back in 2021 he has had some intermittent trouble with knowing what he wants to say and getting words out.  ED Course: Upon admission to the emergency department patient was seen to be afebrile with blood pressure 120/64-154/76, and all other vital signs maintained.   CT scan of the head showed no acute abnormality.  Patient was not a tPA candidate as symptoms had resolved.  Labs significant for platelets 131 BUN 20, creatinine 1.36, glucose 180.  Neurology was already aware of the patient and recommended admission for  completion of TIA work-up.  Review of Systems  Constitutional:  Negative for fever.  HENT:  Negative for nosebleeds and tinnitus.   Eyes:  Positive for blurred vision. Negative for pain.  Respiratory:  Negative for cough and shortness of breath.   Cardiovascular:  Negative for chest pain, palpitations and leg swelling.  Gastrointestinal:  Negative for abdominal pain, nausea and vomiting.  Genitourinary:  Negative for dysuria and hematuria.  Skin:  Negative for rash.  Neurological:  Positive for speech change and focal weakness. Negative for headaches.  Psychiatric/Behavioral:  Negative for substance abuse. The patient does not have insomnia.    Past Medical History:  Diagnosis Date   Arthritis    "left thumb" (05/23/2017) right shoulder   Barrett's esophagus    "we've been told that it's all gone; still take RX for GERD" (05/23/2017)   Bilateral swelling of feet and ankles x 3 weeks as of 12-01-2019   CAD (coronary artery disease), native coronary artery    Coronary angiogram 05/03/2014:  Proximal LAD 3.0x12 mm Promus premier stent.  04/08/2014: Mid Cx 3.5 x 16 mm Promus DES.  03/29/2013: Mid LAD 2.75 x 38 mm Promus Premier drug-eluting stent, balloon angioplasty of D1 and distal LAD and stenting of distal RCA with 2.75 x 24 mm promos Premier drug-eluting stent 12/15/2012 widely patent.   Cervicalgia    Chronic bronchitis ([place])    "q yr" ([id number])   Complication of anesthesia    " I do not wake up very well "   COVID-19 05/2019   all  covid symptoms in hospital x 5 days, all symptoms resolved took monoclonal antibody tx    Family history of adverse reaction to anesthesia    "son w/PONV"   GERD (gastroesophageal reflux disease)    Heart murmur    "grew out of it"    Hyperlipidemia    Hypoglycemia, unspecified    IDDM (insulin dependent diabetes mellitus)    type 2   Impotence of organic origin    Iron deficiency anemia    Other malaise and fatigue    Pneumonia 05/2019   PONV  (postoperative nausea and vomiting)    after wisdom teeth pulled   Restless leg    Rotator cuff arthropathy    Tension headache    "sometimes" (05/23/2017)   Unspecified gastritis and gastroduodenitis with hemorrhage    Unstable angina pectoris ([place]) 04/08/2014   Coronary angiogram 05/03/2014:  Proximal LAD 3.0x12 mm Promus premier stent.  04/08/2014: Mid Cx 3.5 x 16 mm Promus DES.  03/29/2013: Mid LAD 2.75 x 38 mm Promus Premier drug-eluting stent, balloon angioplasty of D1 and distal LAD and stenting of distal RCA with 2.75 x 24 mm promos Premier drug-eluting stent 12/15/2012 widely patent.    Past Surgical History:  Procedure Laterality Date   BICEPT TENODESIS Right 12/07/2019   Procedure: RIGHT BICEPS TENODESIS;  Surgeon: Renette Butters, MD;  Location: [facility];  Service: Orthopedics;  Laterality: Right;   CARDIAC CATHETERIZATION N/A 09/25/2015   Procedure: Left Heart Cath and Coronary Angiography;  Surgeon: Adrian Prows, MD;  Location: [place] [facility];  Service: Cardiovascular;  Laterality: N/A;   CARDIAC CATHETERIZATION N/A 09/25/2015   Procedure: Intravascular Pressure Wire/FFR Study;  Surgeon: Adrian Prows, MD;  Location: [place] [facility];  Service: Cardiovascular;  Laterality: N/A;   CORONARY ANGIOPLASTY WITH STENT PLACEMENT  12/15/2012; 04/28/2014; 05/03/2014   "2; 1; 1" , 4 stents 2 balloons   FRACTIONAL FLOW RESERVE WIRE Right 03/26/2013   Procedure: FRACTIONAL FLOW RESERVE WIRE;  Surgeon: Laverda Page, MD;  Location: [facility] CATH LAB;  Service: Cardiovascular;  Laterality: Right;   FRACTIONAL FLOW RESERVE WIRE N/A 05/03/2014   Procedure: FRACTIONAL FLOW RESERVE WIRE;  Surgeon: Laverda Page, MD;  Location: [facility] CATH LAB;  Service: Cardiovascular;  Laterality: N/A;   HERNIA REPAIR  [id number]   "umbilical"    INCISION AND DRAINAGE ABSCESS Left 05/2007   "groin"    KNEE SURGERY Right 1992;  2000   "calcium deposits removed" (12/15/2012)   LEFT HEART CATH AND CORONARY  ANGIOGRAPHY N/A 05/23/2017   Procedure: LEFT HEART CATH AND CORONARY ANGIOGRAPHY;  Surgeon: Adrian Prows, MD;  Location: [place] [facility];  Service: Cardiovascular;  Laterality: N/A;   LEFT HEART CATHETERIZATION WITH CORONARY ANGIOGRAM N/A 12/15/2012   Procedure: LEFT HEART CATHETERIZATION WITH CORONARY ANGIOGRAM;  Surgeon: Laverda Page, MD;  Location: [facility] CATH LAB;  Service: Cardiovascular;  Laterality: N/A;   LEFT HEART CATHETERIZATION WITH CORONARY ANGIOGRAM N/A 03/26/2013   Procedure: LEFT HEART CATHETERIZATION WITH CORONARY ANGIOGRAM;  Surgeon: Laverda Page, MD;  Location: [facility] CATH LAB;  Service: Cardiovascular;  Laterality: N/A;   LEFT HEART CATHETERIZATION WITH CORONARY ANGIOGRAM N/A 04/08/2014   Procedure: LEFT HEART CATHETERIZATION WITH CORONARY ANGIOGRAM;  Surgeon: Blane Ohara, MD;  Location: [facility] CATH LAB;  Service: Cardiovascular;  Laterality: N/A;   PERCUTANEOUS CORONARY STENT INTERVENTION (PCI-S) N/A 05/03/2014   Procedure: PERCUTANEOUS CORONARY STENT INTERVENTION (PCI-S);  Surgeon: Laverda Page, MD;  Location: [facility] CATH LAB;  Service: Cardiovascular;  Laterality: N/A;   ROTATOR CUFF REPAIR     UMBILICAL HERNIA REPAIR  yrs ago     reports that he has never smoked. He has never used smokeless tobacco. He reports that he does not drink alcohol and does not use drugs.  Allergies  Allergen Reactions   Lyrica [Pregabalin] Other (See Comments)    Made patient very lethargic the next morning, very hard to patient to function, move, etc.    Statins Other (See Comments)    Causes Restless Legs, rhabdomyolysis   Trulicity [Dulaglutide] Other (See Comments)    GI side effects     Family History  Adopted: Yes  Problem Relation Age of Onset   Emphysema Mother     Prior to Admission medications   Medication Sig Start Date End Date Taking? Authorizing Provider  albuterol (PROVENTIL HFA;VENTOLIN HFA) 108 (90 Base) MCG/ACT inhaler Inhale 2 puffs into the lungs every 6 (six)  hours as needed for wheezing or shortness of breath. 06/08/18   Reed, Tiffany L, DO  Alirocumab (PRALUENT) 150 MG/ML SOAJ Inject 1 pen into the skin every 14 (fourteen) days. 12/01/19   Adrian Prows, MD  amLODipine (NORVASC) 5 MG tablet Take 1 tablet (5 mg total) by mouth daily. 01/01/21 05/01/21  Cantwell, Celeste C, PA-C  aspirin 81 MG EC tablet Take 1 tablet (81 mg total) by mouth daily. 12/16/12   Adrian Prows, MD  BD PEN NEEDLE NANO 2ND GEN 32G X 4 MM MISC USE AS DIRECTED 04/12/20   Reed, Tiffany L, DO  Continuous Blood Gluc Receiver (FREESTYLE LIBRE 14 DAY READER) DEVI Inject 1 Device as directed daily as needed. Use once daily as direct to check blood sugar E11.51 05/03/19   Reed, Tiffany L, DO  Continuous Blood Gluc Sensor (FREESTYLE LIBRE 14 DAY SENSOR) MISC Use to test blood sugar three times daily. E11.51. E11.59 08/29/20   Lauree Chandler, NP  D-5000 125 MCG (5000 UT) TABS TAKE 1 TABLET BY MOUTH EVERY DAY 02/03/20   Reed, Tiffany L, DO  dapagliflozin propanediol (FARXIGA) 10 MG TABS tablet Take 1 tablet (10 mg total) by mouth daily before breakfast. 12/25/20   Renato Shin, MD  famotidine (PEPCID) 20 MG tablet TAKE 1 TABLET BY MOUTH EVERY DAY 07/06/19   Reed, Tiffany L, DO  fluticasone (FLONASE) 50 MCG/ACT nasal spray Place 1 spray into both nostrils daily as needed for allergies or rhinitis.    [provider]  gabapentin (NEURONTIN) 100 MG capsule Take 2 capsules (200 mg total) by mouth at bedtime. 09/05/20   Lauree Chandler, NP  glucose 4 GM chewable tablet Chew 1 tablet (4 g total) by mouth as needed for low blood sugar. 07/28/20   Fargo, Amy E, NP  insulin aspart (NOVOLOG FLEXPEN) 100 UNIT/ML FlexPen Inject 15-20 Units into the skin 3 (three) times daily with meals. 09/14/20   Renato Shin, MD  Insulin Glargine [facility]) 100 UNIT/ML Inject 40 units subcutaneously into the skin at bedtime 07/28/20   Lauree Chandler, NP  [facility] FINEPOINT LANCETS MISC Use to test for blood  sugar three times daily dx: E11.51 06/01/18   Lauree Chandler, NP  metoprolol succinate (TOPROL-XL) 25 MG 24 hr tablet Take 1 tablet (25 mg total) by mouth daily. Take with or immediately following a meal. 01/01/21 05/01/21  Cantwell, Celeste C, PA-C  nitroGLYCERIN (NITROSTAT) 0.4 MG SL tablet Place 1 tablet (0.4 mg total) under the tongue every 5 (five) minutes as needed  for chest pain. 10/04/20   Lauree Chandler, NP  ONETOUCH ULTRA test strip USE TO TEST FOR BLOOD SUGAR THREE TIMES DAILY DX: E11.51 12/04/18   Reed, Tiffany L, DO  pantoprazole (PROTONIX) 40 MG tablet Take one tablet by mouth once daily for stomach 09/25/20   Lauree Chandler, NP  pramipexole (MIRAPEX) 1 MG tablet TAKE 1 TABLET BY MOUTH AT 5PM, 1 TABLET BY MOUTH AT 7PM, AND TAKE 2 TABLETS BY MOUTH AT BEDTIME. 11/23/20   Lauree Chandler, NP  valsartan (DIOVAN) 160 MG tablet Take 1 tablet (160 mg total) by mouth every evening. 06/02/20   Adrian Prows, MD    Physical Exam:  Constitutional: NAD, calm, comfortable Vitals:   01/14/21 1316 01/14/21 1500 01/14/21 1530 01/14/21 1545  BP: 130/82 131/79 (!) 154/76 (!) 150/88  Pulse: 67 68 (!) 59 65  Resp: 16 16 17  (!) 21  Temp: 97.6 F (36.4 C)     TempSrc:      SpO2: 98% 99% 97% 96%   Eyes: PERRL, lids and conjunctivae normal ENMT: Mucous membranes are moist. Posterior pharynx clear of any exudate or lesions.Normal dentition.  Neck: normal, supple, no masses, no thyromegaly Respiratory: clear to auscultation bilaterally, no wheezing, no crackles. Normal respiratory effort. No accessory muscle use.  Cardiovascular: Regular rate and rhythm, no murmurs / rubs / gallops. No extremity edema. 2+ pedal pulses. No carotid bruits.  Abdomen: no tenderness, no masses palpated. No hepatosplenomegaly. Bowel sounds positive.  Musculoskeletal: no clubbing / cyanosis. No joint deformity upper and lower extremities. Good ROM, no contractures. Normal muscle tone.  Skin: no rashes, lesions, ulcers.  No induration Neurologic: CN 2-12 grossly intact. Sensation intact, DTR normal. Strength 5/5 in all 4.  Psychiatric: Normal judgment and insight. Alert and oriented x 3. Normal mood.     Labs on Admission: I have personally reviewed following labs and imaging studies  CBC: Recent Labs  Lab 01/14/21 1146 01/14/21 1152  WBC 5.6  --   NEUTROABS 3.9  --   HGB 14.2 13.9  HCT 42.5 41.0  MCV 88.0  --   PLT 131*  --    Basic Metabolic Panel: Recent Labs  Lab 01/14/21 1146 01/14/21 1152  NA 138 140  K 4.4 4.1  CL 106 104  CO2 25  --   GLUCOSE 180* 184*  BUN 20 22  CREATININE 1.36* 1.20  CALCIUM 9.2  --    GFR: Estimated Creatinine Clearance: 61.7 mL/min (by C-G formula based on SCr of 1.2 mg/dL). Liver Function Tests: Recent Labs  Lab 01/14/21 1146  AST 24  ALT 38  ALKPHOS 45  BILITOT 0.6  PROT 6.5  ALBUMIN 3.8   No results for input(s): LIPASE, AMYLASE in the last 168 hours. No results for input(s): AMMONIA in the last 168 hours. Coagulation Profile: Recent Labs  Lab 01/14/21 1146  INR 1.0   Cardiac Enzymes: No results for input(s): CKTOTAL, CKMB, CKMBINDEX, TROPONINI in the last 168 hours. BNP (last 3 results) No results for input(s): PROBNP in the last 8760 hours. HbA1C: No results for input(s): HGBA1C in the last 72 hours. CBG: No results for input(s): GLUCAP in the last 168 hours. Lipid Profile: No results for input(s): CHOL, HDL, LDLCALC, TRIG, CHOLHDL, LDLDIRECT in the last 72 hours. Thyroid Function Tests: No results for input(s): TSH, T4TOTAL, FREET4, T3FREE, THYROIDAB in the last 72 hours. Anemia Panel: No results for input(s): VITAMINB12, FOLATE, FERRITIN, TIBC, IRON, RETICCTPCT in the last 72 hours. Urine  analysis:    Component Value Date/Time   COLORURINE YELLOW 04/26/2019 0112   APPEARANCEUR CLEAR 04/26/2019 0112   LABSPEC <1.005 (L) 04/26/2019 0112   PHURINE 6.0 04/26/2019 0112   GLUCOSEU NEGATIVE 04/26/2019 0112   HGBUR NEGATIVE  04/26/2019 0112   BILIRUBINUR NEGATIVE 04/26/2019 0112   KETONESUR NEGATIVE 04/26/2019 0112   PROTEINUR NEGATIVE 04/26/2019 0112   UROBILINOGEN 0.2 09/21/2013 0925   NITRITE NEGATIVE 04/26/2019 0112   LEUKOCYTESUR NEGATIVE 04/26/2019 0112   Sepsis Labs: No results found for this or any previous visit (from the past 240 hour(s)).   Radiological Exams on Admission: CT HEAD WO CONTRAST  Result Date: 01/14/2021 CLINICAL DATA:  Neuro deficit, acute, stroke suspected EXAM: CT HEAD WITHOUT CONTRAST TECHNIQUE: Contiguous axial images were obtained from the base of the skull through the vertex without intravenous contrast. COMPARISON:  None. FINDINGS: Brain: No evidence of acute infarction, hemorrhage, hydrocephalus, extra-axial collection or mass lesion/mass effect. Vascular: Vascular calcifications of the carotid siphons. Skull: Normal. Negative for fracture or focal lesion. Sinuses/Orbits: No acute finding. Other: None. IMPRESSION: No acute intracranial abnormality. Electronically Signed   By: Meda Klinefelter M.D.   On: 01/14/2021 13:00    EKG: Independently reviewed.  Sinus rhythm at 64 bpm with LVH  Assessment/Plan Suspected TIA: Acute.  Patient presents with complaints of complaints of left-sided facial droop with reports of blurred vision and dysarthria that have since resolved.  CT of the brain without signs of acute stroke.  Patient was not a tPA candidate.  Risk factors include essential hypertension and uncontrolled diabetes mellitus type 2. -Admit to a telemetry bed -Neurochecks -Add on lipid panel -Check MRI/MRA of the brain and MRI neck with and without contrast -Aspirin and Plavix load and continuation per neurology -PT/OT/speech -Appreciate neurology consultative services, we will follow-up for further recommendation  Essential hypertension: On admission patient was seen to be admission blood pressure 120/64-154/76. -Held home blood pressure medications to allow for permissive  hypertension  Diabetes mellitus type 2 uncontrolled: Last hemoglobin A1c was 8.1 on 12/25/2020.  Home medication regimen includes insulin 40 units nightly, Farxiga 10 mg daily, and -Hypoglycemic protocols -No need to repeat hemoglobin A1c as it was recently obtained -CBGs before every meal with assist SSI  CAD s/p PCI: Patient without chest pain complaints at this time.  EKG within normal limits.  Restless leg syndrome: Exacerbation of symptoms noted to possibly be related with patient's medication usage and that withdrawal of the medication should be managed by an outpatient neurologist. -Continue gabapentin and pramipexole  Thrombocytopenia: Chronic.  Platelet count 131 on admission without signs of bleeding. -Continue to monitor  Genella Rife -Continue pharmacy substitution of Protonix  DVT prophylaxis: Lovenox Code Status: Full Family Communication: Wife updated at bedside Disposition Plan: Likely discharge home once medically stable Consults called: Neurology Admission status: Observation, likely needing less than 24-hour hospital stay  Clydie Braun MD [facility]   If 7PM-7AM, please contact night-coverage   01/14/2021, 4:08 PM

## 2021-01-15 ENCOUNTER — Other Ambulatory Visit: Payer: Self-pay | Admitting: Neurology

## 2021-01-15 DIAGNOSIS — G459 Transient cerebral ischemic attack, unspecified: Secondary | ICD-10-CM | POA: Diagnosis not present

## 2021-01-15 DIAGNOSIS — E119 Type 2 diabetes mellitus without complications: Secondary | ICD-10-CM | POA: Diagnosis not present

## 2021-01-15 DIAGNOSIS — I10 Essential (primary) hypertension: Secondary | ICD-10-CM | POA: Diagnosis not present

## 2021-01-15 DIAGNOSIS — Z794 Long term (current) use of insulin: Secondary | ICD-10-CM | POA: Diagnosis not present

## 2021-01-15 DIAGNOSIS — G2581 Restless legs syndrome: Secondary | ICD-10-CM | POA: Diagnosis not present

## 2021-01-15 LAB — CBG MONITORING, ED
Glucose-Capillary: 196 mg/dL — ABNORMAL HIGH (ref 70–99)
Glucose-Capillary: 146 mg/dL — ABNORMAL HIGH (ref 70–99)

## 2021-01-15 LAB — HEMOGLOBIN A1C
Hgb A1c MFr Bld: 7.9 % — ABNORMAL HIGH (ref 4.8–5.6)
Mean Plasma Glucose: 180.03 mg/dL

## 2021-01-15 LAB — LIPID PANEL
Cholesterol: 81 mg/dL (ref 0–200)
HDL: 31 mg/dL — ABNORMAL LOW (ref >40)
LDL Cholesterol: 15 mg/dL (ref 0–99)
Total CHOL/HDL Ratio: 2.6 RATIO
Triglycerides: 174 mg/dL — ABNORMAL HIGH (ref <150)
VLDL: 35 mg/dL (ref 0–40)

## 2021-01-15 LAB — RAPID URINE DRUG SCREEN, HOSP PERFORMED
Amphetamines: NOT DETECTED
Barbiturates: NOT DETECTED
Benzodiazepines: NOT DETECTED
Cocaine: NOT DETECTED
Opiates: NOT DETECTED
Tetrahydrocannabinol: NOT DETECTED

## 2021-01-15 LAB — VITAMIN B12: Vitamin B-12: 236 pg/mL (ref 180–914)

## 2021-01-15 LAB — TSH: TSH: 2.684 u[IU]/mL (ref 0.350–4.500)

## 2021-01-15 MED ORDER — CLOPIDOGREL BISULFATE 75 MG PO TABS: 75.0000 mg | ORAL_TABLET | Freq: Every day | ORAL | 0 refills | Status: AC

## 2021-01-15 MED ORDER — METOPROLOL SUCCINATE ER 25 MG PO TB24: 12.5000 mg | ORAL_TABLET | Freq: Every day | ORAL | 3 refills | Status: DC

## 2021-01-15 MED ORDER — ASPIRIN 81 MG PO TBEC: 81.0000 mg | DELAYED_RELEASE_TABLET | Freq: Every day | ORAL | 0 refills | Status: AC

## 2021-01-15 NOTE — ED Notes (Signed)
CBG 238 per pt's Libre.

## 2021-01-15 NOTE — Progress Notes (Signed)
SLP Cancellation Note  Patient Details Name: Kenneth Borsuk Sr. MRN: [id number] DOB: [birth year]   Cancelled treatment:       Reason Eval/Treat Not Completed: SLP screened, no needs identified, will sign off   Zenola Dezarn, Riley Nearing 01/15/2021, 7:50 AM

## 2021-01-15 NOTE — ED Notes (Signed)
Ambulatory to b/r with steady gait. 

## 2021-01-15 NOTE — ED Notes (Signed)
Patient and wife made aware of plan of care.  Updated on plan of care.

## 2021-01-15 NOTE — Evaluation (Signed)
Physical Therapy Evaluation and Discharge Patient Details Name: Kenneth Pettis Sr. MRN: [id number] DOB: 04/24/1950 Today's Date: 01/15/2021  History of Present Illness  Pt is a 70 y/o male admitted secondary to L facial droop and blurry vision in L eye. Symptoms have since resolved and MRI negative. PMH includes CAD s/p PCI, DM, restless leg syndrome, and CKD.  Clinical Impression  Patient evaluated by Physical Therapy with no further acute PT needs identified. All education has been completed and the patient has no further questions. Pt overall steady and at an independent level with mobility tasks. Performed DGI tasks without LOB. Educated about "BE FAST" acronym when recognizing CVA symptoms. See below for any follow-up Physical Therapy or equipment needs. PT is signing off. Thank you for this referral. If needs change, please re-consult.         Recommendations for follow up therapy are one component of a multi-disciplinary discharge planning process, led by the attending physician.  Recommendations may be updated based on patient status, additional functional criteria and insurance authorization.  Follow Up Recommendations No PT follow up    Assistance Recommended at Discharge None  Functional Status Assessment Patient has not had a recent decline in their functional status  Equipment Recommendations  None recommended by PT    Recommendations for Other Services       Precautions / Restrictions Precautions Precautions: None Restrictions Weight Bearing Restrictions: No      Mobility  Bed Mobility Overal bed mobility: Independent                  Transfers Overall transfer level: Independent                      Ambulation/Gait Ambulation/Gait assistance: Independent Gait Distance (Feet): 200 Feet Assistive device: None Gait Pattern/deviations: WFL(Within Functional Limits) Gait velocity: WFL     General Gait Details: Overall steady gait and able to  perform DGI tasks without LOB.  Stairs            Wheelchair Mobility    Modified Rankin (Stroke Patients Only)       Balance Overall balance assessment: Independent                               Standardized Balance Assessment Standardized Balance Assessment : Dynamic Gait Index   Dynamic Gait Index Level Surface: Normal Change in Gait Speed: Normal Gait with Horizontal Head Turns: Normal Gait with Vertical Head Turns: Normal Gait and Pivot Turn: Normal Step Over Obstacle: Normal Step Around Obstacles: Normal       Pertinent Vitals/Pain Pain Assessment: No/denies pain    Home Living Family/patient expects to be discharged to:: Private residence Living Arrangements: Spouse/significant other Available Help at Discharge: Family Type of Home: House Home Access: Stairs to enter Entrance Stairs-Rails: None Secretary/administrator of Steps: 1   Home Layout: One level Home Equipment: Crutches;BSC/3in1      Prior Function Prior Level of Function : Independent/Modified Independent                     Hand Dominance   Dominant Hand: Right    Extremity/Trunk Assessment   Upper Extremity Assessment Upper Extremity Assessment: Overall WFL for tasks assessed    Lower Extremity Assessment Lower Extremity Assessment: Overall WFL for tasks assessed    Cervical / Trunk Assessment Cervical / Trunk Assessment: Normal  Communication   Communication: No  difficulties  Cognition Arousal/Alertness: Awake/alert Behavior During Therapy: WFL for tasks assessed/performed Overall Cognitive Status: Within Functional Limits for tasks assessed                                          General Comments General comments (skin integrity, edema, etc.): Educated about "BE FAST" acronym in recognizing CVA symptoms.    Exercises     Assessment/Plan    PT Assessment Patient does not need any further PT services  PT Problem List          PT Treatment Interventions      PT Goals (Current goals can be found in the Care Plan section)  Acute Rehab PT Goals Patient Stated Goal: to go home PT Goal Formulation: With patient Time For Goal Achievement: 01/15/21 Potential to Achieve Goals: Good    Frequency     Barriers to discharge        Co-evaluation               AM-PAC PT "6 Clicks" Mobility  Outcome Measure Help needed turning from your back to your side while in a flat bed without using bedrails?: None Help needed moving from lying on your back to sitting on the side of a flat bed without using bedrails?: None Help needed moving to and from a bed to a chair (including a wheelchair)?: None Help needed standing up from a chair using your arms (e.g., wheelchair or bedside chair)?: None Help needed to walk in hospital room?: None Help needed climbing 3-5 steps with a railing? : None 6 Click Score: 24    End of Session   Activity Tolerance: Patient tolerated treatment well Patient left: in bed;with call bell/phone within reach;with family/visitor present (on stretcher in ED) Nurse Communication: Mobility status PT Visit Diagnosis: Other symptoms and signs involving the nervous system (F75.883)    Time: 2549-8264 PT Time Calculation (min) (ACUTE ONLY): 13 min   Charges:   PT Evaluation $PT Eval Low Complexity: 1 Low          Cindee Salt, DPT  Acute Rehabilitation Services  Pager: [phone] Office: [phone]   Lehman Prom 01/15/2021, 10:11 AM

## 2021-01-15 NOTE — ED Notes (Signed)
Ambulatory to b/r, steady gait 

## 2021-01-15 NOTE — ED Notes (Signed)
Urine sent. Back to stretcher with steady gait. PT at [facility]

## 2021-01-15 NOTE — ED Notes (Addendum)
Pt alert, NAD, calm, interactive, resps e/u, speech clear, "feel normal", denies sx or complaints, questions or needs, wife at BS, VSS, BP elevated.

## 2021-01-15 NOTE — ED Notes (Signed)
Patient provided with sandwich and diet coke prior to insulin administration.  Per patient's CBG monitor, patient's CBG is currently 148

## 2021-01-15 NOTE — Progress Notes (Signed)
OT Cancellation Note  Patient Details Name: Kenneth Pellot Sr. MRN: [id number] DOB: 1950/12/01   Cancelled Treatment:    Reason Eval/Treat Not Completed: OT screened, no needs identified, will sign off  Mikelle Myrick,HILLARY 01/15/2021, 11:03 AM Luisa Dago, OT/L   Acute OT Clinical Specialist Acute Rehabilitation Services Pager [phone] Office [phone]

## 2021-01-15 NOTE — Progress Notes (Addendum)
STROKE TEAM PROGRESS NOTE   ATTENDING NOTE: I reviewed above note and agree with the assessment and plan.   70 year old male with history of hypertension, hyperlipidemia, CAD status post PCI, DM, CKD, severe RLS admitted for left facial droop, blurry vision lasting for 45 minutes.  CT negative, MRI negative, MRA unremarkable except right VA hypoplastic which occluded proximally.  EF 60 to 65%.  LDL 15, A1c 7.9, UDS negative.  Creatinine 1.20.  Etiology for patient symptoms likely to be TIA.  Recommend aspirin 81 and Plavix 75 DAPT for 3 weeks and then Plavix alone.  Continue Praluent for HLD management.  Continue follow-up with Dr. Karel Jarvis at [facility].  For detailed assessment and plan, please refer to above as I have made changes wherever appropriate.   Marvel Plan, MD PhD Stroke Neurology 01/15/2021 8:11 PM    INTERVAL HISTORY His wife is at the bedside. No new neurological events overnight. Requesting to go home. Updated both of them on all exams and tests. All questions answered and verbalized understanding. Also asking about a Neurologist at [facility] to help manage his parkinson's.   Vitals:   01/15/21 1115 01/15/21 1130 01/15/21 1145 01/15/21 1200  BP:    (!) 147/95  Pulse: (!) 51 (!) 52 71 60  Resp: 13 16 17 13   Temp:    97.9 F (36.6 C)  TempSrc:    Oral  SpO2: 99% 97% 100% 99%   CBC:  Recent Labs  Lab 01/14/21 1146 01/14/21 1152  WBC 5.6  --   NEUTROABS 3.9  --   HGB 14.2 13.9  HCT 42.5 41.0  MCV 88.0  --   PLT 131*  --    Basic Metabolic Panel:  Recent Labs  Lab 01/14/21 1146 01/14/21 1152  NA 138 140  K 4.4 4.1  CL 106 104  CO2 25  --   GLUCOSE 180* 184*  BUN 20 22  CREATININE 1.36* 1.20  CALCIUM 9.2  --    Lipid Panel:  Recent Labs  Lab 01/15/21 0811  CHOL 81  TRIG 174*  HDL 31*  CHOLHDL 2.6  VLDL 35  LDLCALC 15   HgbA1c:  Recent Labs  Lab 01/15/21 0811  HGBA1C 7.9*   Urine Drug Screen:  Recent Labs  Lab 01/15/21 0830  LABOPIA NONE  DETECTED  COCAINSCRNUR NONE DETECTED  LABBENZ NONE DETECTED  AMPHETMU NONE DETECTED  THCU NONE DETECTED  LABBARB NONE DETECTED    Alcohol Level No results for input(s): ETH in the last 168 hours.  IMAGING past 24 hours MR ANGIO HEAD WO CONTRAST  Result Date: 01/14/2021 CLINICAL DATA:  Initial evaluation for acute TIA, left-sided facial droop with blurry vision. EXAM: MRI HEAD WITHOUT CONTRAST MRA HEAD WITHOUT CONTRAST MRA NECK WITHOUT CONTRAST TECHNIQUE: Multiplanar, multiecho pulse sequences of the brain and surrounding structures were obtained without intravenous contrast. Angiographic images of the Circle of Willis were obtained using MRA technique without intravenous contrast. Angiographic images of the neck were obtained using MRA technique without intravenous contrast. Carotid stenosis measurements (when applicable) are obtained utilizing NASCET criteria, using the distal internal carotid diameter as the denominator. COMPARISON:  CT from earlier the same day. FINDINGS: MRI HEAD FINDINGS Brain: Cerebral volume within normal limits for age. No focal parenchymal signal abnormality or significant cerebral white matter disease. No abnormal foci of restricted diffusion to suggest acute or subacute ischemia. Gray-white matter differentiation maintained. No encephalomalacia to suggest chronic cortical infarction. No acute intracranial hemorrhage. Single punctate chronic microhemorrhage noted at  the anterior right frontal lobe of doubtful significance in isolation. No mass lesion, midline shift or mass effect no hydrocephalus or extra-axial fluid collection. Pituitary gland suprasellar region within normal limits. Midline structures intact. Vascular: Major intracranial vascular flow voids are maintained. Skull and upper cervical spine: Craniocervical junction within normal limits. Bone marrow signal intensity normal. No scalp soft tissue abnormality. Sinuses/Orbits: Globes and orbital soft tissues within  normal limits. Paranasal sinuses are clear. No significant mastoid effusion. Inner ear structures grossly normal. Other: None. MRA HEAD FINDINGS ANTERIOR CIRCULATION: Visualized distal cervical segments of the internal carotid arteries are patent with antegrade flow. Petrous, cavernous, and supraclinoid segments patent without stenosis or other abnormality. A1 segments patent bilaterally. Right A1 hypoplastic. Normal anterior communicating artery complex. Anterior cerebral arteries patent their distal aspects without stenosis. No M1 stenosis or occlusion. Normal MCA bifurcations. Distal MCA branches perfused and symmetric. POSTERIOR CIRCULATION: Left vertebral artery dominant and widely patent to the vertebrobasilar junction. Right vertebral artery hypoplastic and patent as well. Both PICA origins patent and normal. Basilar patent to its distal aspect without stenosis. Superior cerebellar arteries patent bilaterally. Left PCA supplied via the basilar. Fetal type origin of the right PCA. Both PCAs well perfused to their distal aspects. No intracranial aneurysm. MRA NECK FINDINGS AORTIC ARCH: Examination somewhat technically limited by lack of IV contrast and motion artifact. Visualized aortic arch normal in caliber with normal 3 vessel morphology. No stenosis about the origin the great vessels. RIGHT CAROTID SYSTEM: Visualized right CCA patent without stenosis. No significant atheromatous stenosis about the right carotid bulb. Visualized right ICA patent distally without stenosis, evidence for dissection or occlusion. LEFT CAROTID SYSTEM: Visualized left CCA patent without visible stenosis. No significant atheromatous stenosis about the left carotid bulb. Visualized left ICA patent distally without evidence for dissection or stenosis. VERTEBRAL ARTERIES: Both vertebral arteries appear to arise from the subclavian arteries. Left vertebral artery strongly dominant and widely patent within the neck without dissection or  stenosis. Right vertebral artery hypoplastic, seen on corresponding MRA head portion of this exam. The right vertebral artery is not visualized within the neck, which could be occluded. IMPRESSION: MRI HEAD: Normal brain MRI for age. No acute intracranial infarct or other abnormality identified. MRA HEAD: Normal intracranial MRA. No large vessel occlusion or hemodynamically significant stenosis. No aneurysm. MRA NECK: 1. 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 The distal right V4 segment is patent on corresponding MRA head portion of this exam with perfusion of the right PICA. 2. Widely patent dominant left vertebral artery. 3. Wide patency of both carotid artery systems within the neck. Electronically Signed   By: Rise Mu M.D.   On: 01/14/2021 19:19   MR ANGIO NECK WO CONTRAST  Result Date: 01/14/2021 CLINICAL DATA:  Initial evaluation for acute TIA, left-sided facial droop with blurry vision. EXAM: MRI HEAD WITHOUT CONTRAST MRA HEAD WITHOUT CONTRAST MRA NECK WITHOUT CONTRAST TECHNIQUE: Multiplanar, multiecho pulse sequences of the brain and surrounding structures were obtained without intravenous contrast. Angiographic images of the Circle of Willis were obtained using MRA technique without intravenous contrast. Angiographic images of the neck were obtained using MRA technique without intravenous contrast. Carotid stenosis measurements (when applicable) are obtained utilizing NASCET criteria, using the distal internal carotid diameter as the denominator. COMPARISON:  CT from earlier the same day. FINDINGS: MRI HEAD FINDINGS Brain: Cerebral volume within normal limits for age. No focal parenchymal signal abnormality or significant cerebral white matter  disease. No abnormal foci of restricted diffusion to suggest acute or subacute ischemia. Gray-white matter differentiation maintained. No encephalomalacia to suggest chronic cortical  infarction. No acute intracranial hemorrhage. Single punctate chronic microhemorrhage noted at the anterior right frontal lobe of doubtful significance in isolation. No mass lesion, midline shift or mass effect no hydrocephalus or extra-axial fluid collection. Pituitary gland suprasellar region within normal limits. Midline structures intact. Vascular: Major intracranial vascular flow voids are maintained. Skull and upper cervical spine: Craniocervical junction within normal limits. Bone marrow signal intensity normal. No scalp soft tissue abnormality. Sinuses/Orbits: Globes and orbital soft tissues within normal limits. Paranasal sinuses are clear. No significant mastoid effusion. Inner ear structures grossly normal. Other: None. MRA HEAD FINDINGS ANTERIOR CIRCULATION: Visualized distal cervical segments of the internal carotid arteries are patent with antegrade flow. Petrous, cavernous, and supraclinoid segments patent without stenosis or other abnormality. A1 segments patent bilaterally. Right A1 hypoplastic. Normal anterior communicating artery complex. Anterior cerebral arteries patent their distal aspects without stenosis. No M1 stenosis or occlusion. Normal MCA bifurcations. Distal MCA branches perfused and symmetric. POSTERIOR CIRCULATION: Left vertebral artery dominant and widely patent to the vertebrobasilar junction. Right vertebral artery hypoplastic and patent as well. Both PICA origins patent and normal. Basilar patent to its distal aspect without stenosis. Superior cerebellar arteries patent bilaterally. Left PCA supplied via the basilar. Fetal type origin of the right PCA. Both PCAs well perfused to their distal aspects. No intracranial aneurysm. MRA NECK FINDINGS AORTIC ARCH: Examination somewhat technically limited by lack of IV contrast and motion artifact. Visualized aortic arch normal in caliber with normal 3 vessel morphology. No stenosis about the origin the great vessels. RIGHT CAROTID  SYSTEM: Visualized right CCA patent without stenosis. No significant atheromatous stenosis about the right carotid bulb. Visualized right ICA patent distally without stenosis, evidence for dissection or occlusion. LEFT CAROTID SYSTEM: Visualized left CCA patent without visible stenosis. No significant atheromatous stenosis about the left carotid bulb. Visualized left ICA patent distally without evidence for dissection or stenosis. VERTEBRAL ARTERIES: Both vertebral arteries appear to arise from the subclavian arteries. Left vertebral artery strongly dominant and widely patent within the neck without dissection or stenosis. Right vertebral artery hypoplastic, seen on corresponding MRA head portion of this exam. The right vertebral artery is not visualized within the neck, which could be occluded. IMPRESSION: MRI HEAD: Normal brain MRI for age. No acute intracranial infarct or other abnormality identified. MRA HEAD: Normal intracranial MRA. No large vessel occlusion or hemodynamically significant stenosis. No aneurysm. MRA NECK: 1. 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 The distal right V4 segment is patent on corresponding MRA head portion of this exam with perfusion of the right PICA. 2. Widely patent dominant left vertebral artery. 3. Wide patency of both carotid artery systems within the neck. Electronically Signed   By: Rise Mu M.D.   On: 01/14/2021 19:19   MR BRAIN WO CONTRAST  Result Date: 01/14/2021 CLINICAL DATA:  Initial evaluation for acute TIA, left-sided facial droop with blurry vision. EXAM: MRI HEAD WITHOUT CONTRAST MRA HEAD WITHOUT CONTRAST MRA NECK WITHOUT CONTRAST TECHNIQUE: Multiplanar, multiecho pulse sequences of the brain and surrounding structures were obtained without intravenous contrast. Angiographic images of the Circle of Willis were obtained using MRA technique without intravenous contrast. Angiographic images of  the neck were obtained using MRA technique without intravenous contrast. Carotid stenosis measurements (when applicable) are obtained utilizing NASCET criteria, using the distal internal carotid  diameter as the denominator. COMPARISON:  CT from earlier the same day. FINDINGS: MRI HEAD FINDINGS Brain: Cerebral volume within normal limits for age. No focal parenchymal signal abnormality or significant cerebral white matter disease. No abnormal foci of restricted diffusion to suggest acute or subacute ischemia. Gray-white matter differentiation maintained. No encephalomalacia to suggest chronic cortical infarction. No acute intracranial hemorrhage. Single punctate chronic microhemorrhage noted at the anterior right frontal lobe of doubtful significance in isolation. No mass lesion, midline shift or mass effect no hydrocephalus or extra-axial fluid collection. Pituitary gland suprasellar region within normal limits. Midline structures intact. Vascular: Major intracranial vascular flow voids are maintained. Skull and upper cervical spine: Craniocervical junction within normal limits. Bone marrow signal intensity normal. No scalp soft tissue abnormality. Sinuses/Orbits: Globes and orbital soft tissues within normal limits. Paranasal sinuses are clear. No significant mastoid effusion. Inner ear structures grossly normal. Other: None. MRA HEAD FINDINGS ANTERIOR CIRCULATION: Visualized distal cervical segments of the internal carotid arteries are patent with antegrade flow. Petrous, cavernous, and supraclinoid segments patent without stenosis or other abnormality. A1 segments patent bilaterally. Right A1 hypoplastic. Normal anterior communicating artery complex. Anterior cerebral arteries patent their distal aspects without stenosis. No M1 stenosis or occlusion. Normal MCA bifurcations. Distal MCA branches perfused and symmetric. POSTERIOR CIRCULATION: Left vertebral artery dominant and widely patent to the vertebrobasilar  junction. Right vertebral artery hypoplastic and patent as well. Both PICA origins patent and normal. Basilar patent to its distal aspect without stenosis. Superior cerebellar arteries patent bilaterally. Left PCA supplied via the basilar. Fetal type origin of the right PCA. Both PCAs well perfused to their distal aspects. No intracranial aneurysm. MRA NECK FINDINGS AORTIC ARCH: Examination somewhat technically limited by lack of IV contrast and motion artifact. Visualized aortic arch normal in caliber with normal 3 vessel morphology. No stenosis about the origin the great vessels. RIGHT CAROTID SYSTEM: Visualized right CCA patent without stenosis. No significant atheromatous stenosis about the right carotid bulb. Visualized right ICA patent distally without stenosis, evidence for dissection or occlusion. LEFT CAROTID SYSTEM: Visualized left CCA patent without visible stenosis. No significant atheromatous stenosis about the left carotid bulb. Visualized left ICA patent distally without evidence for dissection or stenosis. VERTEBRAL ARTERIES: Both vertebral arteries appear to arise from the subclavian arteries. Left vertebral artery strongly dominant and widely patent within the neck without dissection or stenosis. Right vertebral artery hypoplastic, seen on corresponding MRA head portion of this exam. The right vertebral artery is not visualized within the neck, which could be occluded. IMPRESSION: MRI HEAD: Normal brain MRI for age. No acute intracranial infarct or other abnormality identified. MRA HEAD: Normal intracranial MRA. No large vessel occlusion or hemodynamically significant stenosis. No aneurysm. MRA NECK: 1. 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 The distal right V4 segment is patent on corresponding MRA head portion of this exam with perfusion of the right PICA. 2. Widely patent dominant left vertebral artery. 3. Wide patency of both carotid  artery systems within the neck. Electronically Signed   By: Rise Mu M.D.   On: 01/14/2021 19:19    PHYSICAL EXAM  Temp:  [97.9 F (36.6 C)] 97.9 F (36.6 C) (11/14 1200) Pulse Rate:  [51-79] 60 (11/14 1200) Resp:  [5-21] 13 (11/14 1200) BP: (107-162)/(50-101) 147/95 (11/14 1200) SpO2:  [91 %-100 %] 99 % (11/14 1200)  General - Well nourished, well developed, in no apparent distress.  Ophthalmologic - fundi not visualized due  to noncooperation.  Cardiovascular - Regular rhythm and rate.  Mental Status -  Level of arousal and orientation to time, place, and person were intact. Language including expression, naming, repetition, comprehension was assessed and found intact. Attention span and concentration were normal. Recent and remote memory were intact. Fund of Knowledge was assessed and was intact.  Cranial Nerves II - XII - II - Visual field intact OU. III, IV, VI - Extraocular movements intact. V - Facial sensation intact bilaterally. VII - Facial movement intact bilaterally. VIII - Hearing & vestibular intact bilaterally. X - Palate elevates symmetrically. XI - Chin turning & shoulder shrug intact bilaterally. XII - Tongue protrusion intact.  Motor Strength - The patient's strength was normal in all extremities and pronator drift was absent.  Bulk was normal and fasciculations were absent.  Has bilateral twitching in legs due to RLS Motor Tone - Muscle tone was assessed at the neck and appendages and was normal.  Sensory - Light touch, temperature/pinprick were assessed and were symmetrical.    Coordination - The patient had normal movements in the hands and feet with no ataxia or dysmetria.  Tremor was absent.  Gait and Station - deferred.   ASSESSMENT/PLAN Mr. Kenneth Rawlings Sr. is a 70 y.o. male with history of HTN (statin intolerance), HLD, CAD status post PCI, Uncontrolled DM 2, CKD, Barrett's esophagus and severe RLS who presents to the ED after  experiencing transient left facial droop and blurry vision while at church singing which lasted about 45 minutes and resolved.   Likely TIA CT head No acute abnormality.  MRI   Normal brain MRI for age. No acute intracranial infarct or other abnormality identified.   MRA head/neck Normal intracranial MRA. No large vessel occlusion or hemodynamically significant stenosis. No aneurysm MRA neck  1. 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 The distal right V4 segment is patent on corresponding MRA head portion of this exam with perfusion of the right PICA. 2. Widely patent dominant left vertebral artery. 3. Wide patency of both carotid artery systems within the neck 2D Echo EF 60-65%  LDL 15 HgbA1c 7.9 VTE prophylaxis - SCD's    Diet   Diet heart healthy/carb modified Room service appropriate? Yes; Fluid consistency: Thin   No antithrombotic prior to admission, now on aspirin 81 mg daily and clopidogrel 75 mg daily.  Therapy recommendations:  pending Disposition:  pending  Hypertension Home meds:  norvasc and toprolo XL  Permissive hypertension (OK if < 220/120) but gradually normalize in 5-7 days Long-term BP goal normotensive  Hyperlipidemia Home meds:  Praluent, not resumed in hospital LDL 15, goal < 70 Continue statin at discharge  Diabetes type II UnControlled Home meds:  insulin,  HgbA1c 7.9, goal < 7.0 CBGs SSI  Other Stroke Risk Factors Advanced Age >/= 33  Coronary artery disease   Other Active Problems GERD RLS  CKD  Neurology will sign off   Hospital day # 0  Gevena Mart, NP   To contact Stroke Continuity provider, please refer to [email or web address]. After hours, contact General Neurology

## 2021-01-15 NOTE — ED Notes (Signed)
Admitting at BS

## 2021-01-15 NOTE — ED Notes (Signed)
Pt ambulated to bathroom with minimal assistance.

## 2021-01-17 ENCOUNTER — Other Ambulatory Visit: Payer: Medicare Other | Admitting: *Deleted

## 2021-01-17 ENCOUNTER — Other Ambulatory Visit: Payer: Medicare Other

## 2021-01-18 ENCOUNTER — Other Ambulatory Visit: Payer: Self-pay

## 2021-01-18 DIAGNOSIS — Z20822 Contact with and (suspected) exposure to covid-19: Secondary | ICD-10-CM

## 2021-01-18 MED ORDER — D-5000 125 MCG (5000 UT) PO TABS: 1.0000 | ORAL_TABLET | Freq: Every day | ORAL | 3 refills | Status: DC

## 2021-01-18 NOTE — Patient Outreach (Signed)
[facility]) Care Management  01/18/2021  Kenneth Strahm Sr. 18-May-1950 [id number]  [facility]) Care Management RN Health Coach Note   01/18/2021 Name:  Kenneth Eisenberger Sr. MRN:  [id number] DOB:  Dec 21, 1950  Summary: Patient states he is feeling well after his ED visit on 01/14/21 for TIA. He explains that he will make an appointment with [facility] neurology to follow up on the TIA and for his restless leg syndrome. Patient reports that he continues to work towards lowering his A1c and that his blood sugar values are coming down. Patient states that he will begin to monitor his B/P and record his values daily. He does realize that his blood pressure is not well controlled. Nurse provided education regarding diabetes and hypertension.  Recommendations/Changes made from today's visit: Monitor B/P daily and record the values into log Bring the log to all provider appointments Continue to monitor blood sugar 3 times daily and as needed via CGM Eat a snack before bedtime and/or drink Glucerna to prevent hypoglycemia Limit sodium, sugar, and carbohydrates in diet  Subjective: Kenneth Turman Sr. is an 70 y.o. year old male who is a primary patient of [name], NP. The care management team was consulted for assistance with care management and/or care coordination needs.    RN Health Coach completed Telephone Visit today.   Objective:  Medications Reviewed Today     Reviewed by Wanda Plump, RN (Registered Nurse) on 01/17/21 at 1351  Med List Status: <None>   Medication Order Taking? Sig Documenting Provider Last Dose Status Informant  albuterol (PROVENTIL HFA;VENTOLIN HFA) 108 (90 Base) MCG/ACT inhaler [id number] Yes Inhale 2 puffs into the lungs every 6 (six) hours as needed for wheezing or shortness of breath. Kermit Balo, DO Taking Active Self           Med Note [facility], SHARON P   Wed Dec 01, 2019 11:19 AM)    Alirocumab (PRALUENT) 150 MG/ML SOAJ  [id number] Yes Inject 1 pen into the skin every 14 (fourteen) days. [name], MD Taking Active Self  aspirin 81 MG EC tablet [id number] Yes Take 1 tablet (81 mg total) by mouth daily for 21 days. Swayze, Ava, DO Taking Active   BD PEN NEEDLE NANO 2ND GEN 32G X 4 MM MISC [id number] Yes USE AS DIRECTED Renato Gails, Tiffany L, DO Taking Active Self  clopidogrel (PLAVIX) 75 MG tablet [id number] Yes Take 1 tablet (75 mg total) by mouth daily. Swayze, Ava, DO Taking Active   Continuous Blood Gluc Receiver (FREESTYLE LIBRE 14 DAY READER) DEVI [id number] Yes Inject 1 Device as directed daily as needed. Use once daily as direct to check blood sugar E11.51 Bufford Spikes L, DO Taking Active Self  Continuous Blood Gluc Sensor (FREESTYLE LIBRE 14 DAY SENSOR) [place] [id number] Yes Use to test blood sugar three times daily. E11.51. E11.59 [name], NP Taking Active Self  D-5000 125 MCG (5000 UT) TABS [id number] Yes TAKE 1 TABLET BY MOUTH EVERY DAY  Patient taking differently: Take 5,000 Units by mouth daily.   Reed, Tiffany L, DO Taking Active Self  dapagliflozin propanediol (FARXIGA) 10 MG TABS tablet [id number] Yes Take 1 tablet (10 mg total) by mouth daily before breakfast. Romero Belling, MD Taking Active Self  famotidine (PEPCID) 20 MG tablet [id number] Yes TAKE 1 TABLET BY MOUTH EVERY DAY  Patient taking differently: Take 20 mg by mouth daily.   Reed, Tiffany L, DO Taking Active Self  fluticasone (FLONASE) 50 MCG/ACT  nasal spray [id number] Yes Place 1 spray into both nostrils daily as needed for allergies or rhinitis. [provider] Taking Active Self  gabapentin (NEURONTIN) 100 MG capsule [id number] Yes Take 2 capsules (200 mg total) by mouth at bedtime. [name], NP Taking Active Self  glucose 4 GM chewable tablet [id number] Yes Chew 1 tablet (4 g total) by mouth as needed for low blood sugar. Octavia Heir, NP Taking Active Self  insulin aspart (NOVOLOG FLEXPEN) 100 UNIT/ML FlexPen [id number] Yes  Inject 15-20 Units into the skin 3 (three) times daily with meals. Romero Belling, MD Taking Active Self  Insulin Glargine [facility]) 100 UNIT/ML [id number] Yes Inject 40 units subcutaneously into the skin at bedtime [name], NP Taking Active Self  [facility] FINEPOINT LANCETS MISC [id number] Yes Use to test for blood sugar three times daily dx: E11.51 [name], NP Taking Active Self  metoprolol succinate (TOPROL-XL) 25 MG 24 hr tablet [id number] Yes Take 0.5 tablets (12.5 mg total) by mouth daily. Take with or immediately following a meal. Swayze, Ava, DO Taking Active   nitroGLYCERIN (NITROSTAT) 0.4 MG SL tablet [id number] Yes Place 1 tablet (0.4 mg total) under the tongue every 5 (five) minutes as needed for chest pain. [name], NP Taking Active Self  [facility] ULTRA test strip [id number] Yes USE TO TEST FOR BLOOD SUGAR THREE TIMES DAILY DX: E11.51 Kermit Balo, DO Taking Active Self  pantoprazole (PROTONIX) 40 MG tablet [id number] Yes Take one tablet by mouth once daily for stomach  Patient taking differently: Take 40 mg by mouth daily.   [name], NP Taking Active Self  pramipexole (MIRAPEX) 1 MG tablet [id number] Yes TAKE 1 TABLET BY MOUTH AT 5PM, 1 TABLET BY MOUTH AT 7PM, AND TAKE 2 TABLETS BY MOUTH AT BEDTIME.  Patient taking differently: Take 1-2 mg by mouth See admin instructions. Take 1mg  at 5pm, 1mg  at 7pm, and 2mg  at bedtime.   , NP Taking Active Self  valsartan (DIOVAN) 160 MG tablet Yes Take 1 tablet (160 mg total) by mouth every evening. , MD Taking Active Self             SDOH:  (Social Determinants of Health) assessments and interventions performed: SDOH assessments completed today and documented in the Epic system.  Care Plan  Review of patient past medical history, allergies, medications, health status, including review of consultants reports, laboratory and other test data, was performed as  part of comprehensive evaluation for care management services.   Care Plan : Diabetes Type 2 (Adult)  Updates made by [name], RN since 01/18/2021 12:00 AM     Problem: Glycemic Management (Diabetes, Type 2)   Priority: High     Long-Range Goal: Glycemic Management Optimized Completed 01/18/2021  Start Date: 01/31/2020  Expected End Date: 01/31/2021  Note:   Resolving due to duplicate goal   Evidence-based guidance:  Anticipate A1C testing (point-of-care) every 3 to 6 months based on goal attainment.  Review mutually-set A1C goal or target range.  Anticipate use of antihyperglycemic with or without insulin and periodic adjustments; consider active involvement of pharmacist.  Provide medical nutrition therapy and development of individualized eating.  Compare self-reported symptoms of hypo or hyperglycemia to blood glucose levels, diet and fluid intake, current medications, psychosocial and physiologic stressors, change in activity and barriers to care adherence.  Promote self-monitoring of blood glucose levels.  Assess and address barriers to management plan, such  as food insecurity, age, developmental ability, depression, anxiety, fear of hypoglycemia or weight gain, as well as medication cost, side effects and complicated regimen.  Consider referral to community-based diabetes education program, visiting nurse, community health worker or health coach.  Encourage regular dental care for treatment of periodontal disease; refer to dental provider when needed.   Notes:     Task: Alleviate Barriers to Glycemic Management Completed 01/18/2021  Due Date: 01/31/2021  Note:   Care Management Activities:    - barriers to adherence to treatment plan identified - blood glucose monitoring encouraged - blood glucose readings reviewed - self-awareness of signs/symptoms of hypo or hyperglycemia encouraged - use of blood glucose monitoring log promoted    Notes:     Problem: Disease  Progression (Diabetes, Type 2)   Priority: High     Long-Range Goal: Disease Progression Prevented or Minimized Completed 01/18/2021  Start Date: 01/31/2020  Expected End Date: 01/31/2021  Note:   Resolving due to duplicate goal   Evidence-based guidance:  Prepare patient for laboratory and diagnostic exams based on risk and presentation.  Encourage lifestyle changes, such as increased intake of plant-based foods, stress reduction, consistent physical activity and smoking cessation to prevent long-term complications and chronic disease.   Individualize activity and exercise recommendations while considering potential limitations, such as neuropathy, retinopathy or the ability to prevent hyperglycemia or hypoglycemia.   Prepare patient for use of pharmacologic therapy that may include antihypertensive, analgesic, prostaglandin E1 with periodic adjustments, based on presenting chronic condition and laboratory results.  Assess signs/symptoms and risk factors for hypertension, sleep-disordered breathing, neuropathy (including changes in gait and balance), retinopathy, nephropathy and sexual dysfunction.  Address pregnancy planning and contraceptive choice, especially when prescribing antihypertensive or statin.  Ensure completion of annual comprehensive foot exam and dilated eye exam.   Implement additional individualized goals and interventions based on identified risk factors.  Prepare patient for consultation or referral for specialist care, such as ophthalmology, neurology, cardiology, podiatry, nephrology or perinatology.   Notes:     Task: Monitor and Manage Follow-up for Comorbidities Completed 01/18/2021  Due Date: 01/31/2021  Note:   Care Management Activities:    - activity based on tolerance and functional limitations encouraged - completion of annual dilated eye exam confirmed - completion of annual foot exam verified - healthy lifestyle promoted - medication side effects  managed - modest weight loss (5 percent) promoted - quality of sleep assessed - signs/symptoms of comorbidities identified    Notes:     Care Plan : RN Care Manager Plan of Care  Updates made by Wanda Plump, RN since 01/18/2021 12:00 AM     Problem: Knowledge Deficit Related to Diabetes and Hypertension   Priority: High     Long-Range Goal: Development of Plan of Care for Maagement of Diabetes and Hypertension   Start Date: 01/17/2021  Expected End Date: 01/31/2022  Priority: High  Note:   Current Barriers:  Knowledge Deficits related to plan of care for management of HTN and DMII   RNCM Clinical Goal(s):  Patient will verbalize understanding of plan for management of HTN and DMII as evidenced by monitoring blood sugar and B/P daily and recording the values, taking all prescribed medications as written, adhering to a low sodium and diabetic diet, continuation of walking on treadmill routinely take all medications exactly as prescribed and will call provider for medication related questions as evidenced by patient's verbalizing that he is taking his medications as prescribed and contacting his providers  as needed for questions or concerns    demonstrate improved health management independence as evidenced by working towards lowering A1c and monitoring B/P daily        continue to work with Medical illustrator and/or Social Worker to address care management and care coordination needs related to HTN and DMII as evidenced by adherence to CM Team Scheduled appointments     through collaboration with Medical illustrator, provider, and care team.   Interventions: Inter-disciplinary care team collaboration (see longitudinal plan of care) Evaluation of current treatment plan related to  self management and patient's adherence to plan as established by provider Discussed eating snacks and/or supplementing with Glucerna or high protein supplements to help prevent hypoglycemia Will send patient  hypoglycemia education Will send patient A Matter of Choices Blood Pressure Control Booklet Will send patient Glucerna coupons  Patient Goals/Self-Care Activities: check blood sugar at prescribed times: three times daily and when you have symptoms of low or high blood sugar check feet daily for cuts, sores or redness enter blood sugar readings and medication or insulin into daily log take the blood sugar meter to all doctor visits trim toenails straight across drink 6 to 8 glasses of water each day fill half of plate with vegetables limit fast food meals to no more than 1 per week - check blood pressure daily - write blood pressure results in a log or diary - learn about high blood pressure - take blood pressure log to all doctor appointments - call doctor for signs and symptoms of high blood pressure - take medications for blood pressure exactly as prescribed - begin an exercise program - limit salt intake to 2300mg /day        Plan: Telephone follow up appointment with care management team member scheduled for:  January. Nurse will send PCP today's assessment note, will send patient diabetes and hypertension educations, and Glucerna coupons.  February RN, [name] [facility] [phone] [email or web address]

## 2021-01-18 NOTE — Patient Instructions (Signed)
Visit Information check blood sugar at prescribed times: three times daily and when you have symptoms of low or high blood sugar check feet daily for cuts, sores or redness enter blood sugar readings and medication or insulin into daily log take the blood sugar meter to all doctor visits trim toenails straight across drink 6 to 8 glasses of water each day fill half of plate with vegetables limit fast food meals to no more than 1 per week - check blood pressure daily - write blood pressure results in a log or diary - learn about high blood pressure - take blood pressure log to all doctor appointments - call doctor for signs and symptoms of high blood pressure - take medications for blood pressure exactly as prescribed - begin an exercise program - limit salt intake to 2300mg /day  The patient verbalized understanding of instructions, educational materials, and care plan provided today and agreed to receive a mailed copy of patient instructions, educational materials, and care plan.   Telephone follow up appointment with care management team member scheduled  [email or web address] RN, BSN Nurse [facility] [phone] [email or web address]

## 2021-01-19 ENCOUNTER — Other Ambulatory Visit: Payer: Self-pay | Admitting: Nurse Practitioner

## 2021-01-29 ENCOUNTER — Ambulatory Visit: Payer: Medicare Other | Admitting: *Deleted

## 2021-01-31 ENCOUNTER — Other Ambulatory Visit: Payer: Self-pay

## 2021-01-31 ENCOUNTER — Emergency Department (HOSPITAL_COMMUNITY)
Admission: EM | Admit: 2021-01-31 | Discharge: 2021-01-31 | Disposition: A | Discharge status: Home / Self Care | Payer: Medicare Other | Attending: Emergency Medicine | Admitting: Emergency Medicine

## 2021-01-31 ENCOUNTER — Encounter (HOSPITAL_COMMUNITY): Payer: Self-pay | Admitting: Emergency Medicine

## 2021-01-31 ENCOUNTER — Emergency Department (HOSPITAL_COMMUNITY): Payer: Medicare Other

## 2021-01-31 DIAGNOSIS — R1013 Epigastric pain: Secondary | ICD-10-CM | POA: Diagnosis not present

## 2021-01-31 DIAGNOSIS — R739 Hyperglycemia, unspecified: Secondary | ICD-10-CM | POA: Diagnosis not present

## 2021-01-31 DIAGNOSIS — R0602 Shortness of breath: Secondary | ICD-10-CM | POA: Diagnosis not present

## 2021-01-31 DIAGNOSIS — R079 Chest pain, unspecified: Secondary | ICD-10-CM | POA: Diagnosis not present

## 2021-01-31 DIAGNOSIS — Z5321 Procedure and treatment not carried out due to patient leaving prior to being seen by health care provider: Secondary | ICD-10-CM | POA: Insufficient documentation

## 2021-01-31 DIAGNOSIS — R0789 Other chest pain: Secondary | ICD-10-CM | POA: Diagnosis not present

## 2021-01-31 DIAGNOSIS — R0902 Hypoxemia: Secondary | ICD-10-CM | POA: Diagnosis not present

## 2021-01-31 LAB — CBC
HCT: 42.4 % (ref 39.0–52.0)
Hemoglobin: 14.3 g/dL (ref 13.0–17.0)
MCH: 29.9 pg (ref 26.0–34.0)
MCHC: 33.7 g/dL (ref 30.0–36.0)
MCV: 88.7 fL (ref 80.0–100.0)
Platelets: 125 10*3/uL — ABNORMAL LOW (ref 150–400)
RBC: 4.78 MIL/uL (ref 4.22–5.81)
RDW: 12.6 % (ref 11.5–15.5)
WBC: 5.7 10*3/uL (ref 4.0–10.5)
nRBC: 0 % (ref 0.0–0.2)

## 2021-01-31 LAB — BASIC METABOLIC PANEL
Anion gap: 8 (ref 5–15)
BUN: 24 mg/dL — ABNORMAL HIGH (ref 8–23)
CO2: 26 mmol/L (ref 22–32)
Calcium: 9.3 mg/dL (ref 8.9–10.3)
Chloride: 101 mmol/L (ref 98–111)
Creatinine, Ser: 1.25 mg/dL — ABNORMAL HIGH (ref 0.61–1.24)
GFR, Estimated: >60 mL/min (ref >60)
Glucose, Bld: 287 mg/dL — ABNORMAL HIGH (ref 70–99)
Potassium: 5.5 mmol/L — ABNORMAL HIGH (ref 3.5–5.1)
Sodium: 135 mmol/L (ref 135–145)

## 2021-01-31 LAB — TROPONIN I (HIGH SENSITIVITY)
Troponin I (High Sensitivity): 14 ng/L (ref <18)
Troponin I (High Sensitivity): 14 ng/L (ref <18)

## 2021-01-31 NOTE — ED Notes (Signed)
Pt stated "has an appointment at 1045am tomorrow and was wanting to leave", IV removed. This NT witness pt leaving.

## 2021-01-31 NOTE — ED Triage Notes (Signed)
Per [facility] coming from home c/o chest pain since 7am today. Patient was eating breakfast when started having some angina. Describes it as sharp in epigastric area and radiates up into chest. Lots of cardiac history. Reports feeling fatigued over the past several weeks. NSR. Given 2 nitro and 324 aspirin en route. No change from nitro. 18G right FA.

## 2021-01-31 NOTE — ED Provider Notes (Signed)
Emergency Medicine Provider Triage Evaluation Note  [name] Sr. , a 70 y.o. male  was evaluated in triage.  Pt complains of substernal chest pain started at 0700 while eating breakfast.  EMS gave two nitros and 324mg  of ASA and pain went from 8/10 to 7/10.  Patient reports this is the type of chest pain he feels when he usually exerts himself, but reports he has not exerted himself this morning.  He denies any radiation of the pain.  He reports that the pain is sharp and stabbing in nature.  Patient has history of unstable angina.  He is mildly short of breath, but denies any nausea, diaphoresis, or lightheadedness.  Denies any syncope.  Review of Systems  Positive: Chest pain, shortness of breath Negative: Nausea, diaphoresis, lightheadedness, syncope  Physical Exam  SpO2 98%  Gen:   Awake, no distress   Resp:  Normal effort  MSK:   Moves extremities without difficulty  Other:  Pulses equal bilaterally  Medical Decision Making  Medically screening exam initiated at 9:19 AM.  Appropriate orders placed.  Sr. was informed that the remainder of the evaluation will be completed by another provider, this initial triage assessment does not replace that evaluation, and the importance of remaining in the ED until their evaluation is complete.  Chest pain order set placed.  Nursing aware the patient's next to room.   Fortino Sic, PA-C 01/31/21 02/02/21    [id number], MD 02/01/21 1322

## 2021-02-01 ENCOUNTER — Other Ambulatory Visit: Payer: Self-pay

## 2021-02-01 ENCOUNTER — Emergency Department (HOSPITAL_BASED_OUTPATIENT_CLINIC_OR_DEPARTMENT_OTHER)
Admission: EM | Admit: 2021-02-01 | Discharge: 2021-02-02 | Disposition: A | Discharge status: Home / Self Care | Payer: Medicare Other | Attending: Emergency Medicine | Admitting: Emergency Medicine

## 2021-02-01 ENCOUNTER — Other Ambulatory Visit: Payer: Self-pay | Admitting: *Deleted

## 2021-02-01 ENCOUNTER — Emergency Department (HOSPITAL_BASED_OUTPATIENT_CLINIC_OR_DEPARTMENT_OTHER): Payer: Medicare Other | Admitting: Radiology

## 2021-02-01 DIAGNOSIS — Z794 Long term (current) use of insulin: Secondary | ICD-10-CM | POA: Diagnosis not present

## 2021-02-01 DIAGNOSIS — R0789 Other chest pain: Secondary | ICD-10-CM | POA: Diagnosis not present

## 2021-02-01 DIAGNOSIS — E119 Type 2 diabetes mellitus without complications: Secondary | ICD-10-CM | POA: Diagnosis not present

## 2021-02-01 DIAGNOSIS — Z7984 Long term (current) use of oral hypoglycemic drugs: Secondary | ICD-10-CM | POA: Insufficient documentation

## 2021-02-01 DIAGNOSIS — I209 Angina pectoris, unspecified: Secondary | ICD-10-CM | POA: Diagnosis not present

## 2021-02-01 DIAGNOSIS — Z7982 Long term (current) use of aspirin: Secondary | ICD-10-CM | POA: Insufficient documentation

## 2021-02-01 DIAGNOSIS — Z7902 Long term (current) use of antithrombotics/antiplatelets: Secondary | ICD-10-CM | POA: Diagnosis not present

## 2021-02-01 DIAGNOSIS — I208 Other forms of angina pectoris: Secondary | ICD-10-CM

## 2021-02-01 DIAGNOSIS — Z8616 Personal history of COVID-19: Secondary | ICD-10-CM | POA: Diagnosis not present

## 2021-02-01 DIAGNOSIS — I200 Unstable angina: Secondary | ICD-10-CM | POA: Diagnosis not present

## 2021-02-01 DIAGNOSIS — I10 Essential (primary) hypertension: Secondary | ICD-10-CM | POA: Insufficient documentation

## 2021-02-01 DIAGNOSIS — Z79899 Other long term (current) drug therapy: Secondary | ICD-10-CM | POA: Diagnosis not present

## 2021-02-01 DIAGNOSIS — R079 Chest pain, unspecified: Secondary | ICD-10-CM | POA: Diagnosis not present

## 2021-02-01 LAB — BASIC METABOLIC PANEL
Anion gap: 8 (ref 5–15)
BUN: 22 mg/dL (ref 8–23)
CO2: 28 mmol/L (ref 22–32)
Calcium: 10.2 mg/dL (ref 8.9–10.3)
Chloride: 99 mmol/L (ref 98–111)
Creatinine, Ser: 1.21 mg/dL (ref 0.61–1.24)
GFR, Estimated: >60 mL/min (ref >60)
Glucose, Bld: 295 mg/dL — ABNORMAL HIGH (ref 70–99)
Potassium: 4.8 mmol/L (ref 3.5–5.1)
Sodium: 135 mmol/L (ref 135–145)

## 2021-02-01 LAB — CBG MONITORING, ED: Glucose-Capillary: 255 mg/dL — ABNORMAL HIGH (ref 70–99)

## 2021-02-01 LAB — CBC
HCT: 44.6 % (ref 39.0–52.0)
Hemoglobin: 15.2 g/dL (ref 13.0–17.0)
MCH: 29.3 pg (ref 26.0–34.0)
MCHC: 34.1 g/dL (ref 30.0–36.0)
MCV: 85.9 fL (ref 80.0–100.0)
Platelets: 137 10*3/uL — ABNORMAL LOW (ref 150–400)
RBC: 5.19 MIL/uL (ref 4.22–5.81)
RDW: 12.7 % (ref 11.5–15.5)
WBC: 8.2 10*3/uL (ref 4.0–10.5)
nRBC: 0 % (ref 0.0–0.2)

## 2021-02-01 LAB — TROPONIN I (HIGH SENSITIVITY)
Troponin I (High Sensitivity): 14 ng/L (ref <18)
Troponin I (High Sensitivity): 13 ng/L (ref <18)

## 2021-02-01 MED ORDER — SUCRALFATE 1 G PO TABS: 1.0000 g | ORAL_TABLET | Freq: Three times a day (TID) | ORAL | 0 refills | Status: DC

## 2021-02-01 MED ORDER — PANTOPRAZOLE SODIUM 40 MG IV SOLR
40.0000 mg | Freq: Once | INTRAVENOUS | Status: AC
  Administered 2021-02-01: 40 mg via INTRAVENOUS
  Filled 2021-02-01: qty 40

## 2021-02-01 MED ORDER — KETOROLAC TROMETHAMINE 15 MG/ML IJ SOLN
15.0000 mg | Freq: Once | INTRAMUSCULAR | Status: AC
  Administered 2021-02-01: 15 mg via INTRAVENOUS
  Filled 2021-02-01: qty 1

## 2021-02-01 NOTE — ED Provider Notes (Addendum)
[facility] EMERGENCY DEPT Provider Note   CSN: [id number] Arrival date & time: 02/01/21  1823     History Chief Complaint  Patient presents with   Chest Pain    Kenneth Schleeter Sr. is a 70 y.o. male.  This is a 70 y.o. male with significant medical history as below, including CAD, hyperlipidemia, unstable angina who presents to the ED with complaint of chest pain.  Patient was ongoing chest pain approximately 4 weeks.  Is evaluated in emergency department approximate 3 weeks ago diagnosed with TIA.  Discharged in stable condition.  Patient also went to the Emergency Department yesterday evening secondary to ongoing chest pain but left prior to being evaluated.  Does report intermittent chest pain associate with exertion.  No chest pain at rest.  Some experience the pain was this afternoon when he was installing some furniture in his home.  Pain described as a pressure, tightness sensation primarily is midepigastric but radiating up towards his mid sternum.  He did take a nitroglycerin earlier today which did improve his symptoms.  He has experienced this pain intermittently in the past. The symptoms have not recurred while he is in the emerge dept.  He is currently symptomatic.  He has appointment with cardiology on Monday  The history is provided by the spouse and the patient. No language interpreter was used.  Chest Pain Associated symptoms: no abdominal pain, no cough, no dysphagia, no fever, no headache, no nausea, no palpitations, no shortness of breath and no vomiting       Past Medical History:  Diagnosis Date   Arthritis    "left thumb" (05/23/2017) right shoulder   Barrett's esophagus    "we've been told that it's all gone; still take RX for GERD" (05/23/2017)   Bilateral swelling of feet and ankles x 3 weeks as of 12-01-2019   CAD (coronary artery disease), native coronary artery    Coronary angiogram 05/03/2014:  Proximal LAD 3.0x12 mm Promus premier stent.   04/08/2014: Mid Cx 3.5 x 16 mm Promus DES.  03/29/2013: Mid LAD 2.75 x 38 mm Promus Premier drug-eluting stent, balloon angioplasty of D1 and distal LAD and stenting of distal RCA with 2.75 x 24 mm promos Premier drug-eluting stent 12/15/2012 widely patent.   Cervicalgia    Chronic bronchitis ([place])    "q yr" ([id number])   Complication of anesthesia    " I do not wake up very well "   COVID-19 05/2019   all covid symptoms in hospital x 5 days, all symptoms resolved took monoclonal antibody tx    Family history of adverse reaction to anesthesia    "son w/PONV"   GERD (gastroesophageal reflux disease)    Heart murmur    "grew out of it"    Hyperlipidemia    Hypoglycemia, unspecified    IDDM (insulin dependent diabetes mellitus)    type 2   Impotence of organic origin    Iron deficiency anemia    Other malaise and fatigue    Pneumonia 05/2019   PONV (postoperative nausea and vomiting)    after wisdom teeth pulled   Restless leg    Rotator cuff arthropathy    Tension headache    "sometimes" (05/23/2017)   Unspecified gastritis and gastroduodenitis with hemorrhage    Unstable angina pectoris ([place]) 04/08/2014   Coronary angiogram 05/03/2014:  Proximal LAD 3.0x12 mm Promus premier stent.  04/08/2014: Mid Cx 3.5 x 16 mm Promus DES.  03/29/2013: Mid LAD 2.75 x 38 mm  Promus Premier drug-eluting stent, balloon angioplasty of D1 and distal LAD and stenting of distal RCA with 2.75 x 24 mm promos Premier drug-eluting stent 12/15/2012 widely patent.    Patient Active Problem List   Diagnosis Date Noted   TIA (transient ischemic attack) 01/14/2021   Mild pulmonary hypertension (HCC) 03/06/2020   Hoarseness 05/17/2019   Gastritis with hemorrhage 04/27/2019   Diabetes mellitus type 2, insulin dependent (HCC) 04/27/2019   HLD (hyperlipidemia) 04/27/2019   Restless leg syndrome 04/27/2019   Chronic iron deficiency anemia 04/27/2019   Barrett's esophagus with esophagitis 04/27/2019   Pneumonia due to  COVID-19 virus 04/27/2019   HCAP (healthcare-associated pneumonia) 04/27/2019   Severe sepsis (HCC) 04/27/2019   Severe sepsis with septic shock (HCC) 04/27/2019   Acute lower UTI 04/27/2019   Sepsis due to pneumonia (HCC) 04/26/2019   Mild renal insufficiency 04/26/2019   Hyponatremia 04/26/2019   Thrombocytopenia (HCC) 04/26/2019   COVID-19 virus infection 04/14/2019   Hypokalemia 04/14/2019   Diabetes mellitus type 2, uncontrolled 04/14/2019   Gastroesophageal reflux disease without esophagitis 04/14/2019   Simple chronic bronchitis (HCC) 10/09/2017   Small airways disease 01/04/2017   TSH elevation 07/05/2015   Plantar fasciitis 04/26/2015   Tremor 02/14/2015   Voice disorder 10/19/2014   Unstable angina (HCC) 04/08/2014   Anemia, iron deficiency 09/22/2013   Shortness of breath 08/31/2013   Achilles tendon pain 06/16/2013   CAD (coronary artery disease), native coronary artery 12/15/2012   Impotence of organic origin 06/16/2012   Umbilical hernia without mention of obstruction or gangrene 06/16/2012   Barrett's esophagus 06/16/2012   Gastritis and gastroduodenitis with hemorrhage 06/16/2012   Hyperlipidemia 06/16/2012   Cholelithiasis 06/16/2012   Slowing of urinary stream 06/16/2012   Pain in joint, lower leg 06/16/2012   Essential hypertension 06/16/2012   Restless legs syndrome (RLS) 06/16/2012   Hypoglycemia 06/16/2012   Tension headache 06/16/2012    Past Surgical History:  Procedure Laterality Date   BICEPT TENODESIS Right 12/07/2019   Procedure: RIGHT BICEPS TENODESIS;  Surgeon: Sheral Apley, MD;  Location: [facility];  Service: Orthopedics;  Laterality: Right;   CARDIAC CATHETERIZATION N/A 09/25/2015   Procedure: Left Heart Cath and Coronary Angiography;  Surgeon: [name], MD;  Location: [facility] INVASIVE CV LAB;  Service: Cardiovascular;  Laterality: N/A;   CARDIAC CATHETERIZATION N/A 09/25/2015   Procedure: Intravascular Pressure Wire/FFR  Study;  Surgeon: [name], MD;  Location: [facility] INVASIVE CV LAB;  Service: Cardiovascular;  Laterality: N/A;   CORONARY ANGIOPLASTY WITH STENT PLACEMENT  12/15/2012; 04/28/2014; 05/03/2014   "2; 1; 1" , 4 stents 2 balloons   FRACTIONAL FLOW RESERVE WIRE Right 03/26/2013   Procedure: FRACTIONAL FLOW RESERVE WIRE;  Surgeon: Pamella Pert, MD;  Location: [facility] CATH LAB;  Service: Cardiovascular;  Laterality: Right;   FRACTIONAL FLOW RESERVE WIRE N/A 05/03/2014   Procedure: FRACTIONAL FLOW RESERVE WIRE;  Surgeon: Pamella Pert, MD;  Location: [facility] CATH LAB;  Service: Cardiovascular;  Laterality: N/A;   HERNIA REPAIR  2010   "umbilical"    INCISION AND DRAINAGE ABSCESS Left 05/2007   "groin"    KNEE SURGERY Right 1992;  2000   "calcium deposits removed" (12/15/2012)   LEFT HEART CATH AND CORONARY ANGIOGRAPHY N/A 05/23/2017   Procedure: LEFT HEART CATH AND CORONARY ANGIOGRAPHY;  Surgeon: [name], MD;  Location: [facility] INVASIVE CV LAB;  Service: Cardiovascular;  Laterality: N/A;   LEFT HEART CATHETERIZATION WITH CORONARY ANGIOGRAM N/A 12/15/2012   Procedure: LEFT HEART  CATHETERIZATION WITH CORONARY ANGIOGRAM;  Surgeon: Laverda Page, MD;  Location[facility] CATH LAB;  Service: Cardiovascular;  Laterality: N/A;   LEFT HEART CATHETERIZATION WITH CORONARY ANGIOGRAM N/A 03/26/2013   Procedure: LEFT HEART CATHETERIZATION WITH CORONARY ANGIOGRAM;  Surgeon: Laverda Page, MD;  Location: [facility] CATH LAB;  Service: Cardiovascular;  Laterality: N/A;   LEFT HEART CATHETERIZATION WITH CORONARY ANGIOGRAM N/A 04/08/2014   Procedure: LEFT HEART CATHETERIZATION WITH CORONARY ANGIOGRAM;  Surgeon: Blane Ohara, MD;  Location: [facility] CATH LAB;  Service: Cardiovascular;  Laterality: N/A;   PERCUTANEOUS CORONARY STENT INTERVENTION (PCI-S) N/A 05/03/2014   Procedure: PERCUTANEOUS CORONARY STENT INTERVENTION (PCI-S);  Surgeon: Laverda Page, MD;  Location: [facility] CATH LAB;  Service: Cardiovascular;  Laterality: N/A;   ROTATOR CUFF REPAIR      UMBILICAL HERNIA REPAIR  yrs ago       Family History  Adopted: Yes  Problem Relation Age of Onset   Emphysema Mother     Social History   Tobacco Use   Smoking status: Never   Smokeless tobacco: Never  Vaping Use   Vaping Use: Never used  Substance Use Topics   Alcohol use: No   Drug use: No    Home Medications Prior to Admission medications   Medication Sig Start Date End Date Taking? Authorizing Provider  sucralfate (CARAFATE) 1 g tablet Take 1 tablet (1 g total) by mouth 4 (four) times daily -  with meals and at bedtime for 7 days. 02/01/21 02/08/21 Yes Wynona Dove A, DO  albuterol (PROVENTIL HFA;VENTOLIN HFA) 108 (90 Base) MCG/ACT inhaler Inhale 2 puffs into the lungs every 6 (six) hours as needed for wheezing or shortness of breath. 06/08/18   Reed, Tiffany L, DO  Alirocumab (PRALUENT) 150 MG/ML SOAJ Inject 1 pen into the skin every 14 (fourteen) days. 12/01/19   Adrian Prows, MD  aspirin 81 MG EC tablet Take 1 tablet (81 mg total) by mouth daily for 21 days. 01/15/21 02/05/21  Swayze, Ava, DO  BD PEN NEEDLE NANO 2ND GEN 32G X 4 MM MISC USE AS DIRECTED 04/12/20   Reed, Tiffany L, DO  Cholecalciferol (D-5000) 125 MCG (5000 UT) TABS Take 1 tablet (5,000 Units total) by mouth daily. E55.9 01/18/21   Lauree Chandler, NP  clopidogrel (PLAVIX) 75 MG tablet Take 1 tablet (75 mg total) by mouth daily. 01/16/21 02/15/21  Swayze, Ava, DO  Continuous Blood Gluc Receiver (FREESTYLE LIBRE 14 DAY READER) DEVI Inject 1 Device as directed daily as needed. Use once daily as direct to check blood sugar E11.51 05/03/19   Reed, Tiffany L, DO  Continuous Blood Gluc Sensor (FREESTYLE LIBRE 14 DAY SENSOR) MISC Use to test blood sugar three times daily. E11.51. E11.59 08/29/20   Lauree Chandler, NP  dapagliflozin propanediol (FARXIGA) 10 MG TABS tablet Take 1 tablet (10 mg total) by mouth daily before breakfast. 12/25/20   Renato Shin, MD  famotidine (PEPCID) 20 MG tablet TAKE 1 TABLET BY MOUTH  EVERY DAY Patient taking differently: Take 20 mg by mouth daily. 07/06/19   Reed, Tiffany L, DO  fluticasone (FLONASE) 50 MCG/ACT nasal spray Place 1 spray into both nostrils daily as needed for allergies or rhinitis.    [provider]  gabapentin (NEURONTIN) 100 MG capsule Take 2 capsules (200 mg total) by mouth at bedtime. 09/05/20   Lauree Chandler, NP  glucose 4 GM chewable tablet Chew 1 tablet (4 g total) by mouth as needed for low blood sugar. 07/28/20  Fargo, Amy E, NP  insulin aspart (NOVOLOG FLEXPEN) 100 UNIT/ML FlexPen Inject 15-20 Units into the skin 3 (three) times daily with meals. 09/14/20   Renato Shin, MD  Insulin Glargine [facility]) 100 UNIT/ML Inject 40 units subcutaneously into the skin at bedtime 07/28/20   Lauree Chandler, NP  [facility] FINEPOINT LANCETS MISC Use to test for blood sugar three times daily dx: E11.51 06/01/18   Lauree Chandler, NP  metoprolol succinate (TOPROL-XL) 25 MG 24 hr tablet Take 0.5 tablets (12.5 mg total) by mouth daily. Take with or immediately following a meal. 01/15/21 05/15/21  Swayze, Ava, DO  nitroGLYCERIN (NITROSTAT) 0.4 MG SL tablet Place 1 tablet (0.4 mg total) under the tongue every 5 (five) minutes as needed for chest pain. 10/04/20   Lauree Chandler, NP  ONETOUCH ULTRA test strip USE TO TEST FOR BLOOD SUGAR THREE TIMES DAILY DX: E11.51 12/04/18   Reed, Tiffany L, DO  pantoprazole (PROTONIX) 40 MG tablet Take one tablet by mouth once daily for stomach Patient taking differently: Take 40 mg by mouth daily. 09/25/20   Lauree Chandler, NP  pramipexole (MIRAPEX) 1 MG tablet TAKE 1 TABLET BY MOUTH AT 5PM, 1 TABLET BY MOUTH AT 7PM, AND TAKE 2 TABLETS BY MOUTH AT BEDTIME. Patient taking differently: Take 1-2 mg by mouth See admin instructions. Take 1mg  at 5pm, 1mg  at 7pm, and 2mg  at bedtime. 11/23/20   Lauree Chandler, NP  valsartan (DIOVAN) 160 MG tablet Take 1 tablet (160 mg total) by mouth every evening. 06/02/20   Adrian Prows,  MD    Allergies    Lyrica [pregabalin], Statins, and Trulicity [dulaglutide]  Review of Systems   Review of Systems  Constitutional:  Negative for chills and fever.  HENT:  Negative for facial swelling and trouble swallowing.   Eyes:  Negative for photophobia and visual disturbance.  Respiratory:  Negative for cough and shortness of breath.   Cardiovascular:  Positive for chest pain. Negative for palpitations.  Gastrointestinal:  Negative for abdominal pain, nausea and vomiting.  Endocrine: Negative for polydipsia and polyuria.  Genitourinary:  Negative for difficulty urinating and hematuria.  Musculoskeletal:  Negative for gait problem and joint swelling.  Skin:  Negative for pallor and rash.  Neurological:  Negative for syncope and headaches.  Psychiatric/Behavioral:  Negative for agitation and confusion.    Physical Exam Updated Vital Signs BP 139/76   Pulse 63   Temp 97.8 F (36.6 C)   Resp 17   Ht 5\' 8"  (1.727 m)   Wt 90.7 kg   SpO2 98%   BMI 30.41 kg/m   Physical Exam Vitals and nursing note reviewed.  Constitutional:      General: He is not in acute distress.    Appearance: He is well-developed.  HENT:     Head: Normocephalic and atraumatic.     Right Ear: External ear normal.     Left Ear: External ear normal.     Mouth/Throat:     Mouth: Mucous membranes are moist.  Eyes:     General: No scleral icterus. Cardiovascular:     Rate and Rhythm: Normal rate and regular rhythm.     Pulses: Normal pulses.          Radial pulses are 2+ on the right side and 2+ on the left side.       Dorsalis pedis pulses are 2+ on the right side and 2+ on the left side.     Heart  sounds: Normal heart sounds.  Pulmonary:     Effort: Pulmonary effort is normal. No tachypnea or respiratory distress.     Breath sounds: Normal breath sounds. No decreased breath sounds or wheezing.  Abdominal:     General: Abdomen is flat.     Palpations: Abdomen is soft.     Tenderness: There  is no abdominal tenderness.  Musculoskeletal:        General: Normal range of motion.     Cervical back: Normal range of motion.     Right lower leg: No edema.     Left lower leg: No edema.  Skin:    General: Skin is warm and dry.     Capillary Refill: Capillary refill takes less than 2 seconds.  Neurological:     Mental Status: He is alert and oriented to person, place, and time.  Psychiatric:        Mood and Affect: Mood normal.        Behavior: Behavior normal.    ED Results / Procedures / Treatments   Labs (all labs ordered are listed, but only abnormal results are displayed) Labs Reviewed  BASIC METABOLIC PANEL - Abnormal; Notable for the following components:      Result Value   Glucose, Bld 295 (*)    All other components within normal limits  CBC - Abnormal; Notable for the following components:   Platelets 137 (*)    All other components within normal limits  CBG MONITORING, ED - Abnormal; Notable for the following components:   Glucose-Capillary 255 (*)    All other components within normal limits  TROPONIN I (HIGH SENSITIVITY)  TROPONIN I (HIGH SENSITIVITY)    EKG None  Radiology DG Chest 2 View  Result Date: 02/01/2021 CLINICAL DATA:  Chest pain. EXAM: CHEST - 2 VIEW COMPARISON:  Chest x-ray 05/04/2019. FINDINGS: The heart size and mediastinal contours are within normal limits. Both lungs are clear. The visualized skeletal structures are unremarkable. IMPRESSION: No active cardiopulmonary disease. Electronically Signed   By: Ronney Asters M.D.   On: 02/01/2021 19:07   DG Chest 2 View  Result Date: 01/31/2021 CLINICAL DATA:  Chest pain mild shortness of breath EXAM: CHEST - 2 VIEW COMPARISON:  Chest radiograph 05/04/2019 FINDINGS: The cardiomediastinal silhouette is normal. There is no focal consolidation or pulmonary edema. There is no pleural effusion or pneumothorax. There is no acute osseous abnormality. IMPRESSION: No radiographic evidence of acute  cardiopulmonary process. Electronically Signed   By: Valetta Mole M.D.   On: 01/31/2021 10:10    Procedures Procedures   Medications Ordered in ED Medications  pantoprazole (PROTONIX) injection 40 mg (40 mg Intravenous Given 02/01/21 2309)  ketorolac (TORADOL) 15 MG/ML injection 15 mg (15 mg Intravenous Given 02/01/21 2309)    ED Course  I have reviewed the triage vital signs and the nursing notes.  Pertinent labs & imaging results that were available during my care of the patient were reviewed by me and considered in my medical decision making (see chart for details).    MDM Rules/Calculators/A&P                           CC: cp  This patient complains of cp; this involves an extensive number of treatment options and is a complaint that carries with it a high risk of complications and morbidity. Vital signs were reviewed. Serious etiologies considered.  Record review:  Previous records obtained and reviewed  Additional history obtained from spouse  Work up as above, notable for:  Labs & imaging results that were available during my care of the patient were reviewed by me and considered in my medical decision making.   I ordered imaging studies which included CXR and I independently visualized and interpreted imaging which showed no acute process  EKG reviewed, no evidence of acute ischemia, no STEMI.  Troponin negative x2.  Currently asymptomatic.  Chest x-ray negative.    Labs reviewed and otherwise are negative.  Management: Patient given PPI, analgesic.  He is not currently having chest pain, no aspirin was given.  Reassessment:  Patient reassessed, he remains asymptomatic.  He has appointment with cardiologist on Monday.  He has underlying GERD which could potentially be provoking his epigastric/mid sternal discomfort. He has no tenderness upon palpation to the epigastrium. No difficulty breathing.   Low risk well's score  Favor stable angina as etiology of  presentation.  He is currently asymptomatic, ambulatory, tolerating oral intake. He has appt with his cardiologist on Monday.  The patient improved significantly and was discharged in stable condition. Detailed discussions were had with the patient regarding current findings, and need for close f/u with PCP or on call doctor. The patient has been instructed to return immediately if the symptoms worsen in any way for re-evaluation. Patient verbalized understanding and is in agreement with current care plan. All questions answered prior to discharge.           This chart was dictated using voice recognition software.  Despite best efforts to proofread,  errors can occur which can change the documentation meaning.  Final Clinical Impression(s) / ED Diagnoses Final diagnoses:  Chest pain, unspecified type  Stable angina ([place])    Rx / DC Orders ED Discharge Orders          Ordered    sucralfate (CARAFATE) 1 g tablet  3 times daily with meals & bedtime        02/01/21 2347             Jeanell Sparrow, DO 02/01/21 2351    Jeanell Sparrow, DO 02/01/21 2352

## 2021-02-01 NOTE — Discharge Instructions (Addendum)
Please see cardiologist on Monday, return to ED if your symptoms worsen or if you develop worrisome symptoms.

## 2021-02-01 NOTE — ED Triage Notes (Signed)
Patient reports to the ER for chest pain. Patient reports he was in the ER yesterday. Patient reports he LWBS. Reports significant cardiac hx x4 stents

## 2021-02-01 NOTE — Patient Outreach (Signed)
[facility]) Care Management  02/01/2021  Kenneth Hartman Sr. 30-Nov-1950 [id number]  Unsuccessful outreach attempt made to patient to assess for any needs after ED visit on 01/31/21.  RN Health Coach left HIPAA compliant voicemail message along with her contact information and requested that the patient call her back for questions, concerns, assistance if needed.  Plan: RN Health Coach will call patient within the month of January.  [name] RN, BSN [facility] Care Management  RN Health Coach [phone] [name]

## 2021-02-02 DIAGNOSIS — R079 Chest pain, unspecified: Secondary | ICD-10-CM | POA: Diagnosis not present

## 2021-02-05 ENCOUNTER — Encounter: Payer: Self-pay | Admitting: Cardiology

## 2021-02-05 ENCOUNTER — Other Ambulatory Visit: Payer: Self-pay

## 2021-02-05 ENCOUNTER — Ambulatory Visit: Payer: Medicare Other | Admitting: Cardiology

## 2021-02-05 VITALS — BP 130/76 | HR 78 | Temp 98.3°F | Resp 16 | Ht 68.0 in | Wt 200.4 lb

## 2021-02-05 DIAGNOSIS — E7800 Pure hypercholesterolemia, unspecified: Secondary | ICD-10-CM | POA: Diagnosis not present

## 2021-02-05 DIAGNOSIS — I208 Other forms of angina pectoris: Secondary | ICD-10-CM | POA: Diagnosis not present

## 2021-02-05 DIAGNOSIS — G2581 Restless legs syndrome: Secondary | ICD-10-CM

## 2021-02-05 DIAGNOSIS — I25118 Atherosclerotic heart disease of native coronary artery with other forms of angina pectoris: Secondary | ICD-10-CM | POA: Diagnosis not present

## 2021-02-05 DIAGNOSIS — I10 Essential (primary) hypertension: Secondary | ICD-10-CM

## 2021-02-05 NOTE — Progress Notes (Signed)
Primary Physician/Referring:  [name], NP  Patient ID: Kenneth Sic Sr., male    DOB: 12-29-1950, 70 y.o.   MRN: [id number]  Chief Complaint  Patient presents with   Coronary Artery Disease   Follow-up    4 weeks   HPI:    Kenneth Marsico Sr.  is a 70 y.o. caucasian male with coronary artery disease, last cardiac catheterization on 05/29/2017 when he presented with unstable anginal-like symptoms revealing widely patent stent to his LAD, RCA and circumflex and also balloon angioplasty site to D1 and distal LAD. Fortunately his last PCI has been in 2016.  Past medical history significant for hypertension, hyperlipidemia, GERD, Barrett's esophagus, statin intolerance, uncontrolled diabetes mellitus and also severe restless leg syndrome.  Patient was seen in the emergency room on 02/01/2021 with chest pain suggestive angina pectoris, he has been taking sublingual nitroglycerin with relief of chest pain and reminds him of prior angina. Patient denies syncope, near syncope, dizziness.  He does have bilateral lower leg swelling, which patient states has improved since last visit.  Past Medical History:  Diagnosis Date   Arthritis    "left thumb" (05/23/2017) right shoulder   Barrett's esophagus    "we've been told that it's all gone; still take RX for GERD" (05/23/2017)   Bilateral swelling of feet and ankles x 3 weeks as of 12-01-2019   CAD (coronary artery disease), native coronary artery    Coronary angiogram 05/03/2014:  Proximal LAD 3.0x12 mm Promus premier stent.  04/08/2014: Mid Cx 3.5 x 16 mm Promus DES.  03/29/2013: Mid LAD 2.75 x 38 mm Promus Premier drug-eluting stent, balloon angioplasty of D1 and distal LAD and stenting of distal RCA with 2.75 x 24 mm promos Premier drug-eluting stent 12/15/2012 widely patent.   Cervicalgia    Chronic bronchitis (HCC)    "q yr" (05/23/2017)   Complication of anesthesia    " I do not wake up very well "   COVID-19 05/2019   all covid symptoms in  hospital x 5 days, all symptoms resolved took monoclonal antibody tx    Family history of adverse reaction to anesthesia    "son w/PONV"   GERD (gastroesophageal reflux disease)    Heart murmur    "grew out of it"    Hyperlipidemia    Hypoglycemia, unspecified    IDDM (insulin dependent diabetes mellitus)    type 2   Impotence of organic origin    Iron deficiency anemia    Other malaise and fatigue    Pneumonia 05/2019   PONV (postoperative nausea and vomiting)    after wisdom teeth pulled   Restless leg    Rotator cuff arthropathy    Tension headache    "sometimes" (05/23/2017)   Unspecified gastritis and gastroduodenitis with hemorrhage    Unstable angina pectoris (HCC) 04/08/2014   Coronary angiogram 05/03/2014:  Proximal LAD 3.0x12 mm Promus premier stent.  04/08/2014: Mid Cx 3.5 x 16 mm Promus DES.  03/29/2013: Mid LAD 2.75 x 38 mm Promus Premier drug-eluting stent, balloon angioplasty of D1 and distal LAD and stenting of distal RCA with 2.75 x 24 mm promos Premier drug-eluting stent 12/15/2012 widely patent.   Past Surgical History:  Procedure Laterality Date   BICEPT TENODESIS Right 12/07/2019   Procedure: RIGHT BICEPS TENODESIS;  Surgeon: Sheral Apley, MD;  Location: [facility];  Service: Orthopedics;  Laterality: Right;   CARDIAC CATHETERIZATION N/A 09/25/2015   Procedure: Left Heart Cath and Coronary Angiography;  Surgeon: Adrian Prows, MD;  Location: [place] [facility];  Service: Cardiovascular;  Laterality: N/A;   CARDIAC CATHETERIZATION N/A 09/25/2015   Procedure: Intravascular Pressure Wire/FFR Study;  Surgeon: Adrian Prows, MD;  Location: [place] [facility];  Service: Cardiovascular;  Laterality: N/A;   CORONARY ANGIOPLASTY WITH STENT PLACEMENT  12/15/2012; 04/28/2014; 05/03/2014   "2; 1; 1" , 4 stents 2 balloons   FRACTIONAL FLOW RESERVE WIRE Right 03/26/2013   Procedure: FRACTIONAL FLOW RESERVE WIRE;  Surgeon: Laverda Page, MD;  Location: [facility] CATH LAB;   Service: Cardiovascular;  Laterality: Right;   FRACTIONAL FLOW RESERVE WIRE N/A 05/03/2014   Procedure: FRACTIONAL FLOW RESERVE WIRE;  Surgeon: Laverda Page, MD;  Location: [facility] C[facility];  Service: Cardiovascular;  Laterality: N/A;   HERNIA REPAIR  [id number]   "umbilical"    INCISION AND DRAINAGE ABSCESS Left 05/2007   "groin"    KNEE SURGERY Right 1992;  2000   "calcium deposits removed" (12/15/2012)   LEFT HEART CATH AND CORONARY ANGIOGRAPHY N/A 05/23/2017   Procedure: LEFT HEART CATH AND CORONARY ANGIOGRAPHY;  Surgeon: Adrian Prows, MD;  Location: [place] [facility];  Service: Cardiovascular;  Laterality: N/A;   LEFT HEART CATHETERIZATION WITH CORONARY ANGIOGRAM N/A 12/15/2012   Procedure: LEFT HEART CATHETERIZATION WITH CORONARY ANGIOGRAM;  Surgeon: Laverda Page, MD;  Location: [facility] CATH LAB;  Service: Cardiovascular;  Laterality: N/A;   LEFT HEART CATHETERIZATION WITH CORONARY ANGIOGRAM N/A 03/26/2013   Procedure: LEFT HEART CATHETERIZATION WITH CORONARY ANGIOGRAM;  Surgeon: Laverda Page, MD;  Location: [facility] CATH LAB;  Service: Cardiovascular;  Laterality: N/A;   LEFT HEART CATHETERIZATION WITH CORONARY ANGIOGRAM N/A 04/08/2014   Procedure: LEFT HEART CATHETERIZATION WITH CORONARY ANGIOGRAM;  Surgeon: Blane Ohara, MD;  Location: [facility] CATH LAB;  Service: Cardiovascular;  Laterality: N/A;   PERCUTANEOUS CORONARY STENT INTERVENTION (PCI-S) N/A 05/03/2014   Procedure: PERCUTANEOUS CORONARY STENT INTERVENTION (PCI-S);  Surgeon: Laverda Page, MD;  Location: [facility] CATH LAB;  Service: Cardiovascular;  Laterality: N/A;   ROTATOR CUFF REPAIR     UMBILICAL HERNIA REPAIR  yrs ago   Family History  Adopted: Yes  Problem Relation Age of Onset   Emphysema Mother    Social History   Tobacco Use   Smoking status: Never   Smokeless tobacco: Never  Substance Use Topics   Alcohol use: No     ROS  Review of Systems  Cardiovascular:  Positive for chest pain, claudication and dyspnea on exertion.  Negative for leg swelling, near-syncope, orthopnea, palpitations, paroxysmal nocturnal dyspnea and syncope.  Respiratory:  Negative for shortness of breath.   Musculoskeletal:  Positive for muscle cramps (restless leg).  Neurological:  Negative for dizziness.  Objective   Vitals with BMI 02/05/2021 02/05/2021 02/01/2021  Height - 5\' 8"  -  Weight - 200 lbs 6 oz -  BMI - [id number] -  Systolic [id number]  Diastolic 76 89 74  Pulse 78 76 58    Blood pressure 130/76, pulse 78, temperature 98.3 F (36.8 C), temperature source Temporal, resp. rate 16, height 5\' 8"  (1.727 m), weight 200 lb 6.4 oz (90.9 kg), SpO2 96 %. Body mass index is 30.47 kg/m.   Physical Exam Vitals reviewed.  Constitutional:      Appearance: He is well-developed.  Neck:     Vascular: No carotid bruit.  Cardiovascular:     Rate and Rhythm: Normal rate and regular rhythm.     Pulses:  Carotid pulses are 2+ on the right side and 2+ on the left side.      Femoral pulses are 2+ on the right side and 2+ on the left side.      Dorsalis pedis pulses are 0 on the right side and 0 on the left side.       Posterior tibial pulses are 1+ on the right side and 0 on the left side.     Heart sounds: Murmur heard.  Blowing decrescendo early diastolic murmur is present at the upper right sternal border radiating to the apex.    No gallop.     Comments: No JVD. Pulmonary:     Effort: Pulmonary effort is normal.     Breath sounds: Normal breath sounds.  Musculoskeletal:     Right lower leg: Edema (1+ pitting) present.     Left lower leg: Edema (1+ pitting) present.   Laboratory examination:   Recent Labs    02/22/20 0805 06/30/20 0808 01/03/21 0825 01/14/21 1146 01/14/21 1152 01/31/21 0919 02/01/21 1833  NA 135 137   < > 138 140 135 135  K 4.8 5.2   < > 4.4 4.1 5.5* 4.8  CL 101 102   < > 106 104 101 99  CO2 28 30   < > 25  --  26 28  GLUCOSE 208* 152*   < > 180* 184* 287* 295*  BUN 19 25   < > 20 22 24* 22   CREATININE 1.07 1.25*   < > 1.36* 1.20 1.25* 1.21  CALCIUM 9.4 9.7   < > 9.2  --  9.3 10.2  GFRNONAA 70 58*  --  56*  --  >60 >60  GFRAA 81 67  --   --   --   --   --    < > = values in this interval not displayed.   CMP Latest Ref Rng & Units 02/01/2021 01/31/2021 01/14/2021  Glucose 70 - 99 mg/dL 295(H) 287(H) 184(H)  BUN 8 - 23 mg/dL 22 24(H) 22  Creatinine 0.61 - 1.24 mg/dL 1.21 1.25(H) 1.20  Sodium 135 - 145 mmol/L 135 135 140  Potassium 3.5 - 5.1 mmol/L 4.8 5.5(H) 4.1  Chloride 98 - 111 mmol/L 99 101 104  CO2 22 - 32 mmol/L 28 26 -  Calcium 8.9 - 10.3 mg/dL 10.2 9.3 -  Total Protein 6.5 - 8.1 g/dL - - -  Total Bilirubin 0.3 - 1.2 mg/dL - - -  Alkaline Phos 38 - 126 U/L - - -  AST 15 - 41 U/L - - -  ALT 0 - 44 U/L - - -   CBC Latest Ref Rng & Units 02/01/2021 01/31/2021 01/14/2021  WBC 4.0 - 10.5 K/uL 8.2 5.7 -  Hemoglobin 13.0 - 17.0 g/dL 15.2 14.3 13.9  Hematocrit 39.0 - 52.0 % 44.6 42.4 41.0  Platelets 150 - 400 K/uL 137(L) 125(L) -   Lipid Panel Recent Labs    06/30/20 0808 01/15/21 0811  CHOL 104 81  TRIG 173* 174*  LDLCALC 37 15  VLDL  --  35  HDL 42 31*  CHOLHDL 2.5 2.6    HEMOGLOBIN A1C Lab Results  Component Value Date   HGBA1C 7.9 (H) 01/15/2021   MPG 180.03 01/15/2021   TSH Recent Labs    01/15/21 0811  TSH 2.684     Allergies   Allergies  Allergen Reactions   Lyrica [Pregabalin] Other (See Comments)    Made patient very  lethargic the next morning, very hard to patient to function, move, etc.    Statins Other (See Comments)    Causes Restless Legs, rhabdomyolysis   Trulicity [Dulaglutide] Other (See Comments)    GI side effects     Medications Prior to Visit:   Outpatient Medications Prior to Visit  Medication Sig Dispense Refill   albuterol (PROVENTIL HFA;VENTOLIN HFA) 108 (90 Base) MCG/ACT inhaler Inhale 2 puffs into the lungs every 6 (six) hours as needed for wheezing or shortness of breath. 18 g 1   Alirocumab (PRALUENT) 150  MG/ML SOAJ Inject 1 pen into the skin every 14 (fourteen) days. 6 mL 3   aspirin 81 MG EC tablet Take 1 tablet (81 mg total) by mouth daily for 21 days. 21 tablet 0   BD PEN NEEDLE NANO 2ND GEN 32G X 4 MM MISC USE AS DIRECTED 100 each 3   Cholecalciferol (D-5000) 125 MCG (5000 UT) TABS Take 1 tablet (5,000 Units total) by mouth daily. E55.9 90 tablet 3   clopidogrel (PLAVIX) 75 MG tablet Take 1 tablet (75 mg total) by mouth daily. 30 tablet 0   Continuous Blood Gluc Receiver (FREESTYLE LIBRE 14 DAY READER) DEVI Inject 1 Device as directed daily as needed. Use once daily as direct to check blood sugar E11.51 1 each 0   Continuous Blood Gluc Sensor (FREESTYLE LIBRE 14 DAY SENSOR) MISC Use to test blood sugar three times daily. E11.51. E11.59 2 each 12   dapagliflozin propanediol (FARXIGA) 10 MG TABS tablet Take 1 tablet (10 mg total) by mouth daily before breakfast. 90 tablet 3   famotidine (PEPCID) 20 MG tablet TAKE 1 TABLET BY MOUTH EVERY DAY (Patient taking differently: Take 20 mg by mouth daily.) 30 tablet 1   fluticasone (FLONASE) 50 MCG/ACT nasal spray Place 1 spray into both nostrils daily as needed for allergies or rhinitis.     gabapentin (NEURONTIN) 100 MG capsule Take 2 capsules (200 mg total) by mouth at bedtime. 180 capsule 1   glucose 4 GM chewable tablet Chew 1 tablet (4 g total) by mouth as needed for low blood sugar. 50 tablet 12   insulin aspart (NOVOLOG FLEXPEN) 100 UNIT/ML FlexPen Inject 15-20 Units into the skin 3 (three) times daily with meals. 60 mL 3   Insulin Glargine (BASAGLAR KWIKPEN) 100 UNIT/ML Inject 40 units subcutaneously into the skin at bedtime 45 mL 4   LIFESCAN FINEPOINT LANCETS MISC Use to test for blood sugar three times daily dx: E11.51 300 each 1   metoprolol succinate (TOPROL-XL) 25 MG 24 hr tablet Take 0.5 tablets (12.5 mg total) by mouth daily. Take with or immediately following a meal. (Patient taking differently: Take 25 mg by mouth daily. Take with or  immediately following a meal.) 30 tablet 3   nitroGLYCERIN (NITROSTAT) 0.4 MG SL tablet Place 1 tablet (0.4 mg total) under the tongue every 5 (five) minutes as needed for chest pain. 50 tablet 0   ONETOUCH ULTRA test strip USE TO TEST FOR BLOOD SUGAR THREE TIMES DAILY DX: E11.51 300 strip 11   pantoprazole (PROTONIX) 40 MG tablet Take one tablet by mouth once daily for stomach (Patient taking differently: Take 40 mg by mouth daily.) 90 tablet 1   pramipexole (MIRAPEX) 1 MG tablet TAKE 1 TABLET BY MOUTH AT 5PM, 1 TABLET BY MOUTH AT 7PM, AND TAKE 2 TABLETS BY MOUTH AT BEDTIME. (Patient taking differently: Take 1-2 mg by mouth See admin instructions. Take 1mg  at 5pm, 1mg  at  7pm, and 2mg  at bedtime.) 360 tablet 1   valsartan (DIOVAN) 160 MG tablet Take 1 tablet (160 mg total) by mouth every evening. 90 tablet 3   sucralfate (CARAFATE) 1 g tablet Take 1 tablet (1 g total) by mouth 4 (four) times daily -  with meals and at bedtime for 7 days. (Patient not taking: Reported on 02/05/2021) 28 tablet 0   No facility-administered medications prior to visit.   Final Medications at End of Visit    Current Meds  Medication Sig   albuterol (PROVENTIL HFA;VENTOLIN HFA) 108 (90 Base) MCG/ACT inhaler Inhale 2 puffs into the lungs every 6 (six) hours as needed for wheezing or shortness of breath.   Alirocumab (PRALUENT) 150 MG/ML SOAJ Inject 1 pen into the skin every 14 (fourteen) days.   aspirin 81 MG EC tablet Take 1 tablet (81 mg total) by mouth daily for 21 days.   BD PEN NEEDLE NANO 2ND GEN 32G X 4 MM MISC USE AS DIRECTED   Cholecalciferol (D-5000) 125 MCG (5000 UT) TABS Take 1 tablet (5,000 Units total) by mouth daily. E55.9   clopidogrel (PLAVIX) 75 MG tablet Take 1 tablet (75 mg total) by mouth daily.   Continuous Blood Gluc Receiver (FREESTYLE LIBRE 14 DAY READER) DEVI Inject 1 Device as directed daily as needed. Use once daily as direct to check blood sugar E11.51   Continuous Blood Gluc Sensor  (FREESTYLE LIBRE 14 DAY SENSOR) MISC Use to test blood sugar three times daily. E11.51. E11.59   dapagliflozin propanediol (FARXIGA) 10 MG TABS tablet Take 1 tablet (10 mg total) by mouth daily before breakfast.   famotidine (PEPCID) 20 MG tablet TAKE 1 TABLET BY MOUTH EVERY DAY (Patient taking differently: Take 20 mg by mouth daily.)   fluticasone (FLONASE) 50 MCG/ACT nasal spray Place 1 spray into both nostrils daily as needed for allergies or rhinitis.   gabapentin (NEURONTIN) 100 MG capsule Take 2 capsules (200 mg total) by mouth at bedtime.   glucose 4 GM chewable tablet Chew 1 tablet (4 g total) by mouth as needed for low blood sugar.   insulin aspart (NOVOLOG FLEXPEN) 100 UNIT/ML FlexPen Inject 15-20 Units into the skin 3 (three) times daily with meals.   Insulin Glargine (BASAGLAR KWIKPEN) 100 UNIT/ML Inject 40 units subcutaneously into the skin at bedtime   LIFESCAN FINEPOINT LANCETS MISC Use to test for blood sugar three times daily dx: E11.51   metoprolol succinate (TOPROL-XL) 25 MG 24 hr tablet Take 0.5 tablets (12.5 mg total) by mouth daily. Take with or immediately following a meal. (Patient taking differently: Take 25 mg by mouth daily. Take with or immediately following a meal.)   nitroGLYCERIN (NITROSTAT) 0.4 MG SL tablet Place 1 tablet (0.4 mg total) under the tongue every 5 (five) minutes as needed for chest pain.   ONETOUCH ULTRA test strip USE TO TEST FOR BLOOD SUGAR THREE TIMES DAILY DX: E11.51   pantoprazole (PROTONIX) 40 MG tablet Take one tablet by mouth once daily for stomach (Patient taking differently: Take 40 mg by mouth daily.)   pramipexole (MIRAPEX) 1 MG tablet TAKE 1 TABLET BY MOUTH AT 5PM, 1 TABLET BY MOUTH AT 7PM, AND TAKE 2 TABLETS BY MOUTH AT BEDTIME. (Patient taking differently: Take 1-2 mg by mouth See admin instructions. Take 1mg  at 5pm, 1mg  at 7pm, and 2mg  at bedtime.)   valsartan (DIOVAN) 160 MG tablet Take 1 tablet (160 mg total) by mouth every evening.    Radiology   No  results found.   Cardiac Studies:   Coronary angiogram 09/25/2015: Ostial Cx 50-60%, FFR 0.86. LVEF 60%. Patent stent placed 05/03/2014: Proximal LAD 3.0x12 mm Promus DES. 04/08/2014: Cx/OM1 with 3.5 x 16 mm Promus DES. H/O Mid LAD 2.75 x 38 mm Promus Premier DES, balloon angioplasty of D1 and distal LAD(03/29/2013) and Distal RCA stent (12/15/2012) with 2.75 x 24 mm promos Premier DES.  Lower extremity venous duplex for insufficiency 05/29/2017: Bilateral lower extremities negative for DVT or reflux.  Coronary angiogram 05/30/2019 and 05/23/2017: All stents placed previously are patent.  Proximal to mid LAD long stent (Overlaps prior stent) that was placed on 04/08/2014, circumflex/obtuse marginal-1 stenting. History of mid LAD stenting and balloon angioplasty to D1 and distal LAD performed on 03/29/2013, distal RCA stent in 2014. No change in ostial circumflex lesion, the mid LAD stent is widely patent however at the outflow, there is a 30-40% stenosis.  Lexiscan Tetrofosmin stress test 05/31/2019: No previous exam available for comparison. Lexiscan nuclear stress test performed using 1-day protocol. Stress EKG is non-diagnostic, as this is pharmacological stress test. In addition, stress EKG at 85% MPHR shows sinus tachycardia, no ischemic changes.  Myocardial perfusion imaging is normal. Left ventricular ejection fraction is  71% with normal wall motion.  Low risk study.  Lower Extremity Arterial Duplex 06/22/2020: No hemodynamically significant stenoses are identified in the right or left lower extremity arterial system. Mildly  abnormal biphasic waveform noted throughout the lower extremity. The right and left mid posterior tibial and mid anterior tibial arteries are non-compressible due to medial arteriosclerosis.   PCV ECHOCARDIOGRAM COMPLETE 01/04/2021  Narrative Echocardiogram 01/04/2021: Normal LV systolic function with visual EF 60-65%. Left ventricle cavity is normal  in size. Mild left ventricular hypertrophy. Normal global wall motion. Normal diastolic filling pattern, normal LAP. Mild (Grade I) aortic regurgitation. Mild tricuspid regurgitation. No evidence of pulmonary hypertension. Compared to study 06/22/2020 MR is now resolved otherwise no significant change.   EKG   EKG 02/01/2021: Normal sinus rhythm at rate of 85 bpm, left axis deviation, poor R wave progression, cannot exclude anteroseptal infarct old.  Borderline criteria for LVH in aVL.  No significant change from prior EKG.  Assessment     ICD-10-CM   1. Coronary artery disease of native artery of native heart with stable angina pectoris ([place])  I25.118     2. Essential hypertension  I10     3. Pure hypercholesterolemia  E78.00     4. Restless legs syndrome (RLS)  G25.81 Ambulatory referral to Neurology     No orders of the defined types were placed in this encounter.   There are no discontinued medications.   Recommendations:   Kenneth Brannon Sr.  is a 71 y.o. caucasian male with coronary artery disease, last cardiac catheterization on 05/29/2017 when he presented with unstable anginal-like symptoms revealing widely patent stent to his LAD, RCA and circumflex and also balloon angioplasty site to D1 and distal LAD.  Fortunately his last PCI has been in 2016.  Past medical history significant for hypertension, hyperlipidemia, GERD, Barrett's esophagus, statin intolerance, uncontrolled diabetes mellitus and also severe restless leg syndrome.  He also had florid COVID 19 infection in February 2021.   Patient was seen in the emergency room on 02/01/2021 with chest pain suggestive angina pectoris, he has been taking sublingual nitroglycerin with relief of chest pain and reminds him of prior angina.  I reviewed his records, negative for any injury on EKG and no change from EKG.  We discussed regarding epicardial progression of coronary artery disease in view of uncontrolled diabetes mellitus versus  endothelial dysfunction and subendocardial ischemia.  I discussed with him regarding proceeding with cardiac catheterization versus being get exercise nuclear stress test.  Patient and his wife prefer exercise nuclear stress test, states that if no high risk features are evident, he would like to increase his physical activity.  Nuclear stress test has been set up.  Blood pressure is well controlled, lipids are at goal.  With regard to restless leg syndrome, he has become significantly disabled.  Now he may be developing side effect of the medication that he has been using on a long-term basis and he is requesting a second opinion from [facility] and referral was made today.  And his nuclear stress test is markedly abnormal or high risk, I will see him back in 6 months for follow-up.   Adrian Prows, PA-C 02/05/2021, 11:49 AM Office: [phone]

## 2021-02-06 ENCOUNTER — Other Ambulatory Visit: Payer: Self-pay

## 2021-02-06 ENCOUNTER — Inpatient Hospital Stay (HOSPITAL_BASED_OUTPATIENT_CLINIC_OR_DEPARTMENT_OTHER)
Admission: EM | Admit: 2021-02-06 | Discharge: 2021-02-11 | DRG: 445 | Disposition: A | Discharge status: Home / Self Care | Payer: Medicare Other | Attending: Family Medicine | Admitting: Family Medicine

## 2021-02-06 ENCOUNTER — Encounter (HOSPITAL_BASED_OUTPATIENT_CLINIC_OR_DEPARTMENT_OTHER): Payer: Self-pay | Admitting: Emergency Medicine

## 2021-02-06 ENCOUNTER — Emergency Department (HOSPITAL_BASED_OUTPATIENT_CLINIC_OR_DEPARTMENT_OTHER): Payer: Medicare Other

## 2021-02-06 DIAGNOSIS — N1831 Chronic kidney disease, stage 3a: Secondary | ICD-10-CM | POA: Diagnosis not present

## 2021-02-06 DIAGNOSIS — R1011 Right upper quadrant pain: Secondary | ICD-10-CM | POA: Diagnosis not present

## 2021-02-06 DIAGNOSIS — I2510 Atherosclerotic heart disease of native coronary artery without angina pectoris: Secondary | ICD-10-CM | POA: Diagnosis not present

## 2021-02-06 DIAGNOSIS — R7989 Other specified abnormal findings of blood chemistry: Secondary | ICD-10-CM | POA: Diagnosis present

## 2021-02-06 DIAGNOSIS — R109 Unspecified abdominal pain: Secondary | ICD-10-CM | POA: Diagnosis not present

## 2021-02-06 DIAGNOSIS — Z7902 Long term (current) use of antithrombotics/antiplatelets: Secondary | ICD-10-CM | POA: Diagnosis not present

## 2021-02-06 DIAGNOSIS — N179 Acute kidney failure, unspecified: Secondary | ICD-10-CM | POA: Diagnosis not present

## 2021-02-06 DIAGNOSIS — D693 Immune thrombocytopenic purpura: Secondary | ICD-10-CM | POA: Diagnosis present

## 2021-02-06 DIAGNOSIS — E1122 Type 2 diabetes mellitus with diabetic chronic kidney disease: Secondary | ICD-10-CM | POA: Diagnosis present

## 2021-02-06 DIAGNOSIS — E876 Hypokalemia: Secondary | ICD-10-CM | POA: Diagnosis not present

## 2021-02-06 DIAGNOSIS — K759 Inflammatory liver disease, unspecified: Secondary | ICD-10-CM | POA: Diagnosis present

## 2021-02-06 DIAGNOSIS — Z794 Long term (current) use of insulin: Secondary | ICD-10-CM | POA: Diagnosis not present

## 2021-02-06 DIAGNOSIS — Z28310 Unvaccinated for covid-19: Secondary | ICD-10-CM

## 2021-02-06 DIAGNOSIS — N281 Cyst of kidney, acquired: Secondary | ICD-10-CM | POA: Diagnosis not present

## 2021-02-06 DIAGNOSIS — I129 Hypertensive chronic kidney disease with stage 1 through stage 4 chronic kidney disease, or unspecified chronic kidney disease: Secondary | ICD-10-CM | POA: Diagnosis not present

## 2021-02-06 DIAGNOSIS — E039 Hypothyroidism, unspecified: Secondary | ICD-10-CM | POA: Diagnosis present

## 2021-02-06 DIAGNOSIS — I25118 Atherosclerotic heart disease of native coronary artery with other forms of angina pectoris: Secondary | ICD-10-CM | POA: Diagnosis not present

## 2021-02-06 DIAGNOSIS — Z8616 Personal history of COVID-19: Secondary | ICD-10-CM

## 2021-02-06 DIAGNOSIS — Z825 Family history of asthma and other chronic lower respiratory diseases: Secondary | ICD-10-CM

## 2021-02-06 DIAGNOSIS — G8929 Other chronic pain: Secondary | ICD-10-CM | POA: Diagnosis not present

## 2021-02-06 DIAGNOSIS — G253 Myoclonus: Secondary | ICD-10-CM | POA: Diagnosis present

## 2021-02-06 DIAGNOSIS — K760 Fatty (change of) liver, not elsewhere classified: Secondary | ICD-10-CM | POA: Diagnosis present

## 2021-02-06 DIAGNOSIS — G2581 Restless legs syndrome: Secondary | ICD-10-CM | POA: Diagnosis present

## 2021-02-06 DIAGNOSIS — E785 Hyperlipidemia, unspecified: Secondary | ICD-10-CM | POA: Diagnosis present

## 2021-02-06 DIAGNOSIS — K219 Gastro-esophageal reflux disease without esophagitis: Secondary | ICD-10-CM | POA: Diagnosis present

## 2021-02-06 DIAGNOSIS — E119 Type 2 diabetes mellitus without complications: Secondary | ICD-10-CM | POA: Diagnosis not present

## 2021-02-06 DIAGNOSIS — Z8673 Personal history of transient ischemic attack (TIA), and cerebral infarction without residual deficits: Secondary | ICD-10-CM | POA: Diagnosis not present

## 2021-02-06 DIAGNOSIS — Z6830 Body mass index (BMI) 30.0-30.9, adult: Secondary | ICD-10-CM | POA: Diagnosis not present

## 2021-02-06 DIAGNOSIS — K8020 Calculus of gallbladder without cholecystitis without obstruction: Principal | ICD-10-CM | POA: Diagnosis present

## 2021-02-06 DIAGNOSIS — E7800 Pure hypercholesterolemia, unspecified: Secondary | ICD-10-CM | POA: Diagnosis not present

## 2021-02-06 DIAGNOSIS — R609 Edema, unspecified: Secondary | ICD-10-CM | POA: Diagnosis not present

## 2021-02-06 DIAGNOSIS — D696 Thrombocytopenia, unspecified: Secondary | ICD-10-CM | POA: Diagnosis not present

## 2021-02-06 DIAGNOSIS — E669 Obesity, unspecified: Secondary | ICD-10-CM | POA: Diagnosis present

## 2021-02-06 DIAGNOSIS — Z0389 Encounter for observation for other suspected diseases and conditions ruled out: Secondary | ICD-10-CM | POA: Diagnosis not present

## 2021-02-06 DIAGNOSIS — R918 Other nonspecific abnormal finding of lung field: Secondary | ICD-10-CM | POA: Diagnosis not present

## 2021-02-06 DIAGNOSIS — R1013 Epigastric pain: Secondary | ICD-10-CM | POA: Diagnosis not present

## 2021-02-06 DIAGNOSIS — Z955 Presence of coronary angioplasty implant and graft: Secondary | ICD-10-CM

## 2021-02-06 DIAGNOSIS — K8050 Calculus of bile duct without cholangitis or cholecystitis without obstruction: Secondary | ICD-10-CM | POA: Diagnosis not present

## 2021-02-06 DIAGNOSIS — Z20822 Contact with and (suspected) exposure to covid-19: Secondary | ICD-10-CM | POA: Diagnosis present

## 2021-02-06 HISTORY — DX: Other amnesia: R41.3

## 2021-02-06 HISTORY — DX: Other specified abnormal findings of blood chemistry: R79.89

## 2021-02-06 HISTORY — DX: Cerebral infarction, unspecified: I63.9

## 2021-02-06 HISTORY — DX: Dyspnea, unspecified: R06.00

## 2021-02-06 LAB — COMPREHENSIVE METABOLIC PANEL WITH GFR
ALT: 356 U/L — ABNORMAL HIGH (ref 0–44)
AST: 474 U/L — ABNORMAL HIGH (ref 15–41)
Albumin: 4.6 g/dL (ref 3.5–5.0)
Alkaline Phosphatase: 91 U/L (ref 38–126)
Anion gap: 9 (ref 5–15)
BUN: 19 mg/dL (ref 8–23)
CO2: 28 mmol/L (ref 22–32)
Calcium: 9.8 mg/dL (ref 8.9–10.3)
Chloride: 98 mmol/L (ref 98–111)
Creatinine, Ser: 1.48 mg/dL — ABNORMAL HIGH (ref 0.61–1.24)
GFR, Estimated: 50 mL/min — ABNORMAL LOW
Glucose, Bld: 277 mg/dL — ABNORMAL HIGH (ref 70–99)
Potassium: 4.7 mmol/L (ref 3.5–5.1)
Sodium: 135 mmol/L (ref 135–145)
Total Bilirubin: 3.5 mg/dL — ABNORMAL HIGH (ref 0.3–1.2)
Total Protein: 7.2 g/dL (ref 6.5–8.1)

## 2021-02-06 LAB — URINALYSIS, ROUTINE W REFLEX MICROSCOPIC
Bilirubin Urine: NEGATIVE
Glucose, UA: >1000 mg/dL — AB
Hgb urine dipstick: NEGATIVE
Ketones, ur: NEGATIVE mg/dL
Leukocytes,Ua: NEGATIVE
Nitrite: NEGATIVE
Specific Gravity, Urine: >1.046 — ABNORMAL HIGH (ref 1.005–1.030)
pH: 7 (ref 5.0–8.0)

## 2021-02-06 LAB — TROPONIN I (HIGH SENSITIVITY)
Troponin I (High Sensitivity): 14 ng/L
Troponin I (High Sensitivity): 13 ng/L (ref <18)

## 2021-02-06 LAB — CBC
HCT: 44.1 % (ref 39.0–52.0)
Hemoglobin: 14.9 g/dL (ref 13.0–17.0)
MCH: 29.6 pg (ref 26.0–34.0)
MCHC: 33.8 g/dL (ref 30.0–36.0)
MCV: 87.7 fL (ref 80.0–100.0)
Platelets: 112 10*3/uL — ABNORMAL LOW (ref 150–400)
RBC: 5.03 MIL/uL (ref 4.22–5.81)
RDW: 13.1 % (ref 11.5–15.5)
WBC: 9.7 10*3/uL (ref 4.0–10.5)
nRBC: 0 % (ref 0.0–0.2)

## 2021-02-06 LAB — RESP PANEL BY RT-PCR (FLU A&B, COVID) ARPGX2
Influenza A by PCR: NEGATIVE
Influenza B by PCR: NEGATIVE
SARS Coronavirus 2 by RT PCR: NEGATIVE

## 2021-02-06 LAB — LIPASE, BLOOD: Lipase: 24 U/L (ref 11–51)

## 2021-02-06 MED ORDER — PRAMIPEXOLE DIHYDROCHLORIDE 1 MG PO TABS
1.0000 mg | ORAL_TABLET | ORAL | Status: DC
  Administered 2021-02-08 – 2021-02-10 (×3): 1 mg via ORAL
  Filled 2021-02-06 (×6): qty 1

## 2021-02-06 MED ORDER — SODIUM CHLORIDE 0.9 % IV SOLN
2.0000 g | Freq: Once | INTRAVENOUS | Status: AC
  Administered 2021-02-06: 2 g via INTRAVENOUS
  Filled 2021-02-06: qty 20

## 2021-02-06 MED ORDER — PRAMIPEXOLE DIHYDROCHLORIDE 1 MG PO TABS
1.0000 mg | ORAL_TABLET | ORAL | Status: DC
  Administered 2021-02-07 – 2021-02-11 (×5): 1 mg via ORAL
  Filled 2021-02-06 (×5): qty 1

## 2021-02-06 MED ORDER — SODIUM CHLORIDE 0.9 % IV BOLUS
500.0000 mL | Freq: Once | INTRAVENOUS | Status: AC
  Administered 2021-02-06: 500 mL via INTRAVENOUS

## 2021-02-06 MED ORDER — ACETAMINOPHEN 325 MG PO TABS
650.0000 mg | ORAL_TABLET | Freq: Once | ORAL | Status: AC | PRN
  Administered 2021-02-06: 650 mg via ORAL
  Filled 2021-02-06: qty 2

## 2021-02-06 MED ORDER — PRAMIPEXOLE DIHYDROCHLORIDE 1 MG PO TABS
2.0000 mg | ORAL_TABLET | Freq: Every day | ORAL | Status: DC
  Administered 2021-02-07 – 2021-02-08 (×2): 1 mg via ORAL
  Administered 2021-02-09 – 2021-02-10 (×2): 2 mg via ORAL
  Filled 2021-02-06 (×6): qty 2

## 2021-02-06 MED ORDER — FENTANYL CITRATE PF 50 MCG/ML IJ SOSY
50.0000 ug | PREFILLED_SYRINGE | Freq: Once | INTRAMUSCULAR | Status: AC
  Administered 2021-02-06: 50 ug via INTRAVENOUS
  Filled 2021-02-06: qty 1

## 2021-02-06 MED ORDER — ONDANSETRON HCL 4 MG/2ML IJ SOLN
4.0000 mg | Freq: Once | INTRAMUSCULAR | Status: AC
  Administered 2021-02-06: 4 mg via INTRAVENOUS
  Filled 2021-02-06: qty 2

## 2021-02-06 MED ORDER — IOHEXOL 300 MG/ML  SOLN
100.0000 mL | Freq: Once | INTRAMUSCULAR | Status: AC | PRN
  Administered 2021-02-06: 100 mL via INTRAVENOUS

## 2021-02-06 MED ORDER — PRAMIPEXOLE DIHYDROCHLORIDE 1 MG PO TABS: 1.0000 mg | ORAL_TABLET | ORAL | Status: DC

## 2021-02-06 NOTE — ED Provider Notes (Signed)
[facility] EMERGENCY DEPT Provider Note   CSN: [id number] Arrival date & time: 02/06/21  1353     History Chief Complaint  Patient presents with   Abdominal Pain    Kenneth Harton Sr. is a 70 y.o. male.  70 y.o male with a PMH CAD, GERD, right esophagus presents to the ED with a chief complaint of epigastric pain that is been ongoing for months worsening yesterday.  She reports the pain has been ongoing for several months however this worsened this morning around 6 AM, he feels that the pain exacerbates whenever he tries to move, reports lifting some heavy boxes into his [employer]iece when the pain began hurting.  Feels that the pain hurts despite not eating.  He reports while getting in the car today he began to feel nauseated, did not have any vomiting.  He has a prior history of an ulcer and a hernia removal but no prior surgical intervention to his abdomen.  He did see a cardiologist yesterday who schedule a best test for him on Monday.  He was febrile on arrival with a temperature of 100.5.  Your bowel movements, no urinary symptoms, no other complaints.     The history is provided by the patient and medical records.  Abdominal Pain Pain location:  Epigastric Pain quality: sharp   Pain radiates to:  Does not radiate Pain severity:  Moderate Onset quality:  Gradual Duration:  12 weeks Timing:  Intermittent Progression:  Worsening Chronicity:  Recurrent Relieved by:  Nothing Associated symptoms: fever   Associated symptoms: no chest pain, no chills, no shortness of breath and no sore throat       Past Medical History:  Diagnosis Date   Arthritis    "left thumb" (05/23/2017) right shoulder   Barrett's esophagus    "we've been told that it's all gone; still take RX for GERD" (05/23/2017)   Bilateral swelling of feet and ankles x 3 weeks as of 12-01-2019   CAD (coronary artery disease), native coronary artery    Coronary angiogram 05/03/2014:  Proximal LAD 3.0x12 mm Promus  premier stent.  04/08/2014: Mid Cx 3.5 x 16 mm Promus DES.  03/29/2013: Mid LAD 2.75 x 38 mm Promus Premier drug-eluting stent, balloon angioplasty of D1 and distal LAD and stenting of distal RCA with 2.75 x 24 mm promos Premier drug-eluting stent 12/15/2012 widely patent.   Cervicalgia    Chronic bronchitis (HCC)    "q yr" (05/23/2017)   Complication of anesthesia    " I do not wake up very well "   COVID-19 05/2019   all covid symptoms in hospital x 5 days, all symptoms resolved took monoclonal antibody tx    Family history of adverse reaction to anesthesia    "son w/PONV"   GERD (gastroesophageal reflux disease)    Heart murmur    "grew out of it"    Hyperlipidemia    Hypoglycemia, unspecified    IDDM (insulin dependent diabetes mellitus)    type 2   Impotence of organic origin    Iron deficiency anemia    Other malaise and fatigue    Pneumonia 05/2019   PONV (postoperative nausea and vomiting)    after wisdom teeth pulled   Restless leg    Rotator cuff arthropathy    Tension headache    "sometimes" (05/23/2017)   Unspecified gastritis and gastroduodenitis with hemorrhage    Unstable angina pectoris (HCC) 04/08/2014   Coronary angiogram 05/03/2014:  Proximal LAD 3.0x12 mm Promus  premier stent.  04/08/2014: Mid Cx 3.5 x 16 mm Promus DES.  03/29/2013: Mid LAD 2.75 x 38 mm Promus Premier drug-eluting stent, balloon angioplasty of D1 and distal LAD and stenting of distal RCA with 2.75 x 24 mm promos Premier drug-eluting stent 12/15/2012 widely patent.    Patient Active Problem List   Diagnosis Date Noted   Hepatitis 02/06/2021   Pure hypercholesterolemia 02/05/2021   TIA (transient ischemic attack) 01/14/2021   Mild pulmonary hypertension (HCC) 03/06/2020   Hoarseness 05/17/2019   Gastritis with hemorrhage 04/27/2019   Diabetes mellitus type 2, insulin dependent (HCC) 04/27/2019   HLD (hyperlipidemia) 04/27/2019   Restless leg syndrome 04/27/2019   Chronic iron deficiency anemia  04/27/2019   Barrett's esophagus with esophagitis 04/27/2019   Pneumonia due to COVID-19 virus 04/27/2019   HCAP (healthcare-associated pneumonia) 04/27/2019   Severe sepsis (HCC) 04/27/2019   Severe sepsis with septic shock (HCC) 04/27/2019   Acute lower UTI 04/27/2019   Sepsis due to pneumonia (HCC) 04/26/2019   Mild renal insufficiency 04/26/2019   Hyponatremia 04/26/2019   Thrombocytopenia (HCC) 04/26/2019   COVID-19 virus infection 04/14/2019   Hypokalemia 04/14/2019   Diabetes mellitus type 2, uncontrolled 04/14/2019   Gastroesophageal reflux disease without esophagitis 04/14/2019   Simple chronic bronchitis (HCC) 10/09/2017   Small airways disease 01/04/2017   TSH elevation 07/05/2015   Plantar fasciitis 04/26/2015   Tremor 02/14/2015   Voice disorder 10/19/2014   Unstable angina (HCC) 04/08/2014   Anemia, iron deficiency 09/22/2013   Shortness of breath 08/31/2013   Achilles tendon pain 06/16/2013   Coronary artery disease of native artery of native heart with stable angina pectoris (HCC) 12/15/2012   Impotence of organic origin 06/16/2012   Umbilical hernia without mention of obstruction or gangrene 06/16/2012   Barrett's esophagus 06/16/2012   Gastritis and gastroduodenitis with hemorrhage 06/16/2012   Hyperlipidemia 06/16/2012   Cholelithiasis 06/16/2012   Slowing of urinary stream 06/16/2012   Pain in joint, lower leg 06/16/2012   Essential hypertension 06/16/2012   Restless legs syndrome (RLS) 06/16/2012   Hypoglycemia 06/16/2012   Tension headache 06/16/2012    Past Surgical History:  Procedure Laterality Date   BICEPT TENODESIS Right 12/07/2019   Procedure: RIGHT BICEPS TENODESIS;  Surgeon: Sheral Apley, MD;  Location: [facility];  Service: Orthopedics;  Laterality: Right;   CARDIAC CATHETERIZATION N/A 09/25/2015   Procedure: Left Heart Cath and Coronary Angiography;  Surgeon: [name], MD;  Location: [facility] INVASIVE CV LAB;  Service:  Cardiovascular;  Laterality: N/A;   CARDIAC CATHETERIZATION N/A 09/25/2015   Procedure: Intravascular Pressure Wire/FFR Study;  Surgeon: [name], MD;  Location: [facility] INVASIVE CV LAB;  Service: Cardiovascular;  Laterality: N/A;   CORONARY ANGIOPLASTY WITH STENT PLACEMENT  12/15/2012; 04/28/2014; 05/03/2014   "2; 1; 1" , 4 stents 2 balloons   FRACTIONAL FLOW RESERVE WIRE Right 03/26/2013   Procedure: FRACTIONAL FLOW RESERVE WIRE;  Surgeon: Pamella Pert, MD;  Location: [facility] CATH LAB;  Service: Cardiovascular;  Laterality: Right;   FRACTIONAL FLOW RESERVE WIRE N/A 05/03/2014   Procedure: FRACTIONAL FLOW RESERVE WIRE;  Surgeon: Pamella Pert, MD;  Location: [facility] CATH LAB;  Service: Cardiovascular;  Laterality: N/A;   HERNIA REPAIR  2010   "umbilical"    INCISION AND DRAINAGE ABSCESS Left 05/2007   "groin"    KNEE SURGERY Right 1992;  2000   "calcium deposits removed" (12/15/2012)   LEFT HEART CATH AND CORONARY ANGIOGRAPHY N/A 05/23/2017   Procedure: LEFT HEART  CATH AND CORONARY ANGIOGRAPHY;  Surgeon: [name], MD;  Location: [facility] INVASIVE CV LAB;  Service: Cardiovascular;  Laterality: N/A;   LEFT HEART CATHETERIZATION WITH CORONARY ANGIOGRAM N/A 12/15/2012   Procedure: LEFT HEART CATHETERIZATION WITH CORONARY ANGIOGRAM;  Surgeon: Pamella Pert, MD;  Location: [facility] CATH LAB;  Service: Cardiovascular;  Laterality: N/A;   LEFT HEART CATHETERIZATION WITH CORONARY ANGIOGRAM N/A 03/26/2013   Procedure: LEFT HEART CATHETERIZATION WITH CORONARY ANGIOGRAM;  Surgeon: Pamella Pert, MD;  Location: [facility] CATH LAB;  Service: Cardiovascular;  Laterality: N/A;   LEFT HEART CATHETERIZATION WITH CORONARY ANGIOGRAM N/A 04/08/2014   Procedure: LEFT HEART CATHETERIZATION WITH CORONARY ANGIOGRAM;  Surgeon: Micheline Chapman, MD;  Location: [facility] CATH LAB;  Service: Cardiovascular;  Laterality: N/A;   PERCUTANEOUS CORONARY STENT INTERVENTION (PCI-S) N/A 05/03/2014   Procedure: PERCUTANEOUS CORONARY STENT INTERVENTION (PCI-S);   Surgeon: Pamella Pert, MD;  Location: [facility] CATH LAB;  Service: Cardiovascular;  Laterality: N/A;   ROTATOR CUFF REPAIR     UMBILICAL HERNIA REPAIR  yrs ago       Family History  Adopted: Yes  Problem Relation Age of Onset   Emphysema Mother     Social History   Tobacco Use   Smoking status: Never   Smokeless tobacco: Never  Vaping Use   Vaping Use: Never used  Substance Use Topics   Alcohol use: No   Drug use: No    Home Medications Prior to Admission medications   Medication Sig Start Date End Date Taking? Authorizing Provider  albuterol (PROVENTIL HFA;VENTOLIN HFA) 108 (90 Base) MCG/ACT inhaler Inhale 2 puffs into the lungs every 6 (six) hours as needed for wheezing or shortness of breath. 06/08/18   Reed, Tiffany L, DO  Alirocumab (PRALUENT) 150 MG/ML SOAJ Inject 1 pen into the skin every 14 (fourteen) days. 12/01/19   [name], MD  BD PEN NEEDLE NANO 2ND GEN 32G X 4 MM MISC USE AS DIRECTED 04/12/20   Reed, Tiffany L, DO  Cholecalciferol (D-5000) 125 MCG (5000 UT) TABS Take 1 tablet (5,000 Units total) by mouth daily. E55.9 01/18/21   [name], NP  clopidogrel (PLAVIX) 75 MG tablet Take 1 tablet (75 mg total) by mouth daily. 01/16/21 02/15/21  Swayze, Ava, DO  Continuous Blood Gluc Receiver (FREESTYLE LIBRE 14 DAY READER) DEVI Inject 1 Device as directed daily as needed. Use once daily as direct to check blood sugar E11.51 05/03/19   Reed, Tiffany L, DO  Continuous Blood Gluc Sensor (FREESTYLE LIBRE 14 DAY SENSOR) MISC Use to test blood sugar three times daily. E11.51. E11.59 08/29/20   [name], NP  dapagliflozin propanediol (FARXIGA) 10 MG TABS tablet Take 1 tablet (10 mg total) by mouth daily before breakfast. 12/25/20   Romero Belling, MD  famotidine (PEPCID) 20 MG tablet TAKE 1 TABLET BY MOUTH EVERY DAY Patient taking differently: Take 20 mg by mouth daily. 07/06/19   Reed, Tiffany L, DO  fluticasone (FLONASE) 50 MCG/ACT nasal spray Place 1 spray into both  nostrils daily as needed for allergies or rhinitis.    [provider]  gabapentin (NEURONTIN) 100 MG capsule Take 2 capsules (200 mg total) by mouth at bedtime. 09/05/20   [name], NP  glucose 4 GM chewable tablet Chew 1 tablet (4 g total) by mouth as needed for low blood sugar. 07/28/20   Fargo, Amy E, NP  insulin aspart (NOVOLOG FLEXPEN) 100 UNIT/ML FlexPen Inject 15-20 Units into the skin 3 (three) times  daily with meals. 09/14/20   Romero Belling, MD  Insulin Glargine [facility]) 100 UNIT/ML Inject 40 units subcutaneously into the skin at bedtime 07/28/20   [name], NP  [facility] FINEPOINT LANCETS MISC Use to test for blood sugar three times daily dx: E11.51 06/01/18   [name], NP  metoprolol succinate (TOPROL-XL) 25 MG 24 hr tablet Take 0.5 tablets (12.5 mg total) by mouth daily. Take with or immediately following a meal. Patient taking differently: Take 25 mg by mouth daily. Take with or immediately following a meal. 01/15/21 05/15/21  Swayze, Ava, DO  nitroGLYCERIN (NITROSTAT) 0.4 MG SL tablet Place 1 tablet (0.4 mg total) under the tongue every 5 (five) minutes as needed for chest pain. 10/04/20   [name], NP  ONETOUCH ULTRA test strip USE TO TEST FOR BLOOD SUGAR THREE TIMES DAILY DX: E11.51 12/04/18   Reed, Tiffany L, DO  pantoprazole (PROTONIX) 40 MG tablet Take one tablet by mouth once daily for stomach Patient taking differently: Take 40 mg by mouth daily. 09/25/20   [name], NP  pramipexole (MIRAPEX) 1 MG tablet TAKE 1 TABLET BY MOUTH AT 5PM, 1 TABLET BY MOUTH AT 7PM, AND TAKE 2 TABLETS BY MOUTH AT BEDTIME. Patient taking differently: Take 1-2 mg by mouth See admin instructions. Take  at 5pm,  at 7pm, and  at bedtime. 11/23/20   [name], NP  sucralfate (CARAFATE) 1 g tablet Take 1 tablet (1 g total) by mouth 4 (four) times daily -  with meals and at bedtime for 7 days. Patient not taking: Reported on 02/05/2021  02/01/21 02/08/21  Tanda Rockers A, DO  valsartan (DIOVAN) 160 MG tablet Take 1 tablet (160 mg total) by mouth every evening. 06/02/20   [name], MD    Allergies    Lyrica [pregabalin], Statins, and Trulicity [dulaglutide]  Review of Systems   Review of Systems  Constitutional:  Positive for fever. Negative for chills.  HENT:  Negative for sore throat.   Respiratory:  Negative for shortness of breath.   Cardiovascular:  Negative for chest pain.  Gastrointestinal:  Positive for abdominal pain.  All other systems reviewed and are negative.  Physical Exam Updated Vital Signs BP (!) 155/102   Pulse (!) 112   Temp 98.7 F (37.1 C)   Resp 20   SpO2 96%   Physical Exam Vitals and nursing note reviewed.  Constitutional:      Appearance: He is well-developed.  HENT:     Head: Normocephalic.  Cardiovascular:     Rate and Rhythm: Normal rate.  Abdominal:     General: Abdomen is flat. Bowel sounds are normal.     Palpations: Abdomen is soft.     Tenderness: There is abdominal tenderness in the right upper quadrant, epigastric area and left upper quadrant. There is no right CVA tenderness or left CVA tenderness.  Skin:    General: Skin is dry.  Neurological:     Mental Status: He is alert and oriented to person, place, and time.    ED Results / Procedures / Treatments   Labs (all labs ordered are listed, but only abnormal results are displayed) Labs Reviewed  COMPREHENSIVE METABOLIC PANEL - Abnormal; Notable for the following components:      Result Value   Glucose, Bld 277 (*)    Creatinine, Ser 1.48 (*)    AST 474 (*)    ALT 356 (*)    Total Bilirubin 3.5 (*)  GFR, Estimated 50 (*)    All other components within normal limits  CBC - Abnormal; Notable for the following components:   Platelets 112 (*)    All other components within normal limits  URINALYSIS, ROUTINE W REFLEX MICROSCOPIC - Abnormal; Notable for the following components:   Specific Gravity, Urine >1.046  (*)    Glucose, UA >1,000 (*)    Protein, ur TRACE (*)    All other components within normal limits  RESP PANEL BY RT-PCR (FLU A&B, COVID) ARPGX2  LIPASE, BLOOD  TROPONIN I (HIGH SENSITIVITY)  TROPONIN I (HIGH SENSITIVITY)    EKG None  Radiology CT ABDOMEN PELVIS W CONTRAST  Result Date: 02/06/2021 CLINICAL DATA:  Abdominal pain, fever.  History of hernia repair EXAM: CT ABDOMEN AND PELVIS WITH CONTRAST TECHNIQUE: Multidetector CT imaging of the abdomen and pelvis was performed using the standard protocol following bolus administration of intravenous contrast. CONTRAST:  OMNIPAQUE IOHEXOL 300 MG/ML  SOLN COMPARISON:  CT abdomen pelvis 04/20/2018 FINDINGS: Lower chest: No acute abnormality. Hepatobiliary: The hepatic parenchyma is diffusely mildly hypodense compared to the splenic parenchyma consistent with fatty infiltration. No focal liver abnormality. Gallstone noted within the gallbladder lumen. No gallbladder wall thickening or pericholecystic fluid. No biliary dilatation. Pancreas: No focal lesion. Normal pancreatic contour. No surrounding inflammatory changes. No main pancreatic ductal dilatation. Spleen: Normal in size without focal abnormality. Adrenals/Urinary Tract: No adrenal nodule bilaterally. Bilateral kidneys enhance symmetrically. There is a 1.4 cm left renal lesion with a density of 25 Hounsfield units. No hydronephrosis. No hydroureter. The urinary bladder is unremarkable. On delayed imaging, there is no urothelial wall thickening and there are no filling defects in the opacified portions of the bilateral collecting systems or ureters. Stomach/Bowel: Stomach is within normal limits. No evidence of bowel wall thickening or dilatation. Appendix appears normal. Vascular/Lymphatic: No abdominal aorta or iliac aneurysm. Moderate to severe atherosclerotic plaque of the aorta and its branches. No abdominal, pelvic, or inguinal lymphadenopathy. Reproductive: Prostate is unremarkable.  Other: No intraperitoneal free fluid. No intraperitoneal free gas. No organized fluid collection. Musculoskeletal: No abdominal wall hernia or abnormality. No suspicious lytic or blastic osseous lesions. No acute displaced fracture. Multilevel degenerative changes of the spine. IMPRESSION: 1. Cholelithiasis with no CT findings of acute cholecystitis. 2. Possible mild hepatic steatosis. 3. Indeterminate 1.4 cm left renal lesion. Recommend MRI renal protocol for further evaluation. 4.  Aortic Atherosclerosis (ICD10-I70.0). Electronically Signed   By: Tish Frederickson M.D.   On: 02/06/2021 19:40   US Abdomen Limited  Result Date: 02/06/2021 CLINICAL DATA:  Right upper quadrant pain EXAM: ULTRASOUND ABDOMEN LIMITED RIGHT UPPER QUADRANT COMPARISON:  CT 04/20/2018 FINDINGS: Gallbladder: No gallstones or wall thickening visualized. No sonographic Murphy sign noted by sonographer. Common bile duct: Diameter: 5.6 mm Liver: Diffusely echogenic. Suspicion of surface nodularity portal vein is patent on color Doppler imaging with normal direction of blood flow towards the liver. Other: None. IMPRESSION: 1. Negative for gallstones. 2. Echogenic liver consistent with hepatic steatosis and or hepatocellular disease. Possible subtle contour nodularity as may be seen with cirrhosis Electronically Signed   By: Jasmine Pang M.D.   On: 02/06/2021 17:38    Procedures Procedures   Medications Ordered in ED Medications  pramipexole (MIRAPEX) tablet 1 mg (has no administration in time range)  pramipexole (MIRAPEX) tablet 1 mg (1 mg Oral Not Given 02/06/21 1813)  pramipexole (MIRAPEX) tablet 2 mg (2 mg Oral Not Given 02/06/21 2126)  cefTRIAXone (ROCEPHIN) 2 g in sodium  chloride 0.9 % 100 mL IVPB (has no administration in time range)  acetaminophen (TYLENOL) tablet 650 mg (650 mg Oral Given 02/06/21 1430)  ondansetron (ZOFRAN) injection 4 mg (4 mg Intravenous Given 02/06/21 1701)  fentaNYL (SUBLIMAZE) injection 50 mcg (50 mcg  Intravenous Given 02/06/21 1701)  sodium chloride 0.9 % bolus 500 mL (0 mLs Intravenous Stopped 02/06/21 1752)  iohexol (OMNIPAQUE) 300 MG/ML solution 100 mL (100 mLs Intravenous Contrast Given 02/06/21 1910)    ED Course  I have reviewed the triage vital signs and the nursing notes.  Pertinent labs & imaging results that were available during my care of the patient were reviewed by me and considered in my medical decision making (see chart for details).  Clinical Course as of 02/06/21 2157  Tue Feb 06, 2021  1853 AST(!): 474 [JS]  1853 ALT(!): 356 [JS]    Clinical Course User Index [JS] Claude Manges, PA-C   MDM Rules/Calculators/A&P   Presents to the ED with a chief complaint of epigastric pain this been ongoing for months, did have follow-up with cardiology yesterday had a stress test scheduled for Monday.  Reports the pain is exacerbated in nature with some radiation to the right upper quadrant.  According to wife patient was placed on Carafate after his recent discharge from the hospital.  He reports the pain makes him feel nauseated it waxes and wanes in intensity, seems to be exacerbated with any kind of movement.  He arrived in the ED febrile with a temperature of 100.5, given Tylenol on arrival.  During my evaluation bowel sounds are present throughout, significant tenderness to palpation along the epigastric and right upper quadrant region, some suspicion for gallbladder disease.  No prior history of abdominal intervention surgically.  Urinary symptoms, no CVA tenderness bilaterally.  Interpretation of his labs with a CBC with no leukocytosis, hemoglobin is within normal limits.  CMP without any electrolyte derangement.  Respiratory panel is negative.  And Zofran, fentanyl, bolus to help with symptomatic control.  Interpretation of his labs reveal a CBC with no leukocytosis, hemoglobin is within normal limits.  Lipase level is normal.  CMP with slight elevation in his creatinine at  1.48 and then previous.  LFTs are elevated on today's visit with an AST of 474, ALT 356, no prior records for comparison.  UA is currently waiting to be collected.  Respiratory panel is negative for influenza, COVID-19.  Ultrasound without any gallstones visualized.  In the setting of abdominal pain, fever will obtain CT abdomen to rule out any intra-abdominal infection. CT Abdomen showed:  1. Cholelithiasis with no CT findings of acute cholecystitis.  2. Possible mild hepatic steatosis.  3. Indeterminate 1.4 cm left renal lesion. Recommend MRI renal  protocol for further evaluation.  4.  Aortic Atherosclerosis (ICD10-I70.0).      Results were discussed at length with patient, vitals were reassessed, he is afebrile after receiving Tylenol.  No acute findings on CT imaging.  His UA is currently pending.  However, patient's blood pressure has softened as he has been here with the systolic in the 1 teens over 70, heart rate has increased but he is afebrile.  Some suspicion for may be infected stone.  I discussed case with my attending Dr. Bernette Mayers who agrees with antibiotics along with inpatient management.  Place call for hospitalist admission at this time.  9:56 PM spoke to hospitalist Dr. Thane Edu who will admit patient for further management. Patient is followed by [facility], will secure chat chart  to GI  team. Patient hemodynamically stable for admission.   Portions of this note were generated with Scientist, clinical (histocompatibility and immunogenetics). Dictation errors may occur despite best attempts at proofreading.  Final Clinical Impression(s) / ED Diagnoses Final diagnoses:  Right upper quadrant pain    Rx / DC Orders ED Discharge Orders     None        Freddy Jaksch 02/06/21 2157    Pollyann Savoy, MD 02/06/21 2303

## 2021-02-06 NOTE — ED Triage Notes (Signed)
Intermittent upper abd pain for "months". States he states he thought it was his heart so he was seen here a week ago. He was seen by cardiology yesterday and has a stress test scheduled.

## 2021-02-07 ENCOUNTER — Inpatient Hospital Stay (HOSPITAL_COMMUNITY): Payer: Medicare Other

## 2021-02-07 ENCOUNTER — Encounter (HOSPITAL_COMMUNITY): Payer: Self-pay | Admitting: Family Medicine

## 2021-02-07 DIAGNOSIS — N281 Cyst of kidney, acquired: Secondary | ICD-10-CM | POA: Diagnosis not present

## 2021-02-07 DIAGNOSIS — E1122 Type 2 diabetes mellitus with diabetic chronic kidney disease: Secondary | ICD-10-CM | POA: Diagnosis present

## 2021-02-07 DIAGNOSIS — Z0389 Encounter for observation for other suspected diseases and conditions ruled out: Secondary | ICD-10-CM | POA: Diagnosis not present

## 2021-02-07 DIAGNOSIS — D696 Thrombocytopenia, unspecified: Secondary | ICD-10-CM | POA: Diagnosis not present

## 2021-02-07 DIAGNOSIS — Z28310 Unvaccinated for covid-19: Secondary | ICD-10-CM | POA: Diagnosis not present

## 2021-02-07 DIAGNOSIS — R918 Other nonspecific abnormal finding of lung field: Secondary | ICD-10-CM | POA: Diagnosis not present

## 2021-02-07 DIAGNOSIS — N179 Acute kidney failure, unspecified: Secondary | ICD-10-CM | POA: Diagnosis present

## 2021-02-07 DIAGNOSIS — Z825 Family history of asthma and other chronic lower respiratory diseases: Secondary | ICD-10-CM | POA: Diagnosis not present

## 2021-02-07 DIAGNOSIS — R7989 Other specified abnormal findings of blood chemistry: Secondary | ICD-10-CM | POA: Diagnosis present

## 2021-02-07 DIAGNOSIS — K8050 Calculus of bile duct without cholangitis or cholecystitis without obstruction: Secondary | ICD-10-CM | POA: Diagnosis not present

## 2021-02-07 DIAGNOSIS — I2510 Atherosclerotic heart disease of native coronary artery without angina pectoris: Secondary | ICD-10-CM | POA: Diagnosis present

## 2021-02-07 DIAGNOSIS — Z8673 Personal history of transient ischemic attack (TIA), and cerebral infarction without residual deficits: Secondary | ICD-10-CM | POA: Diagnosis not present

## 2021-02-07 DIAGNOSIS — G253 Myoclonus: Secondary | ICD-10-CM | POA: Diagnosis present

## 2021-02-07 DIAGNOSIS — E119 Type 2 diabetes mellitus without complications: Secondary | ICD-10-CM | POA: Diagnosis not present

## 2021-02-07 DIAGNOSIS — E7800 Pure hypercholesterolemia, unspecified: Secondary | ICD-10-CM | POA: Diagnosis not present

## 2021-02-07 DIAGNOSIS — G8929 Other chronic pain: Secondary | ICD-10-CM | POA: Diagnosis not present

## 2021-02-07 DIAGNOSIS — K219 Gastro-esophageal reflux disease without esophagitis: Secondary | ICD-10-CM | POA: Diagnosis present

## 2021-02-07 DIAGNOSIS — D693 Immune thrombocytopenic purpura: Secondary | ICD-10-CM | POA: Diagnosis present

## 2021-02-07 DIAGNOSIS — Z7902 Long term (current) use of antithrombotics/antiplatelets: Secondary | ICD-10-CM | POA: Diagnosis not present

## 2021-02-07 DIAGNOSIS — Z8616 Personal history of COVID-19: Secondary | ICD-10-CM | POA: Diagnosis not present

## 2021-02-07 DIAGNOSIS — Z20822 Contact with and (suspected) exposure to covid-19: Secondary | ICD-10-CM | POA: Diagnosis present

## 2021-02-07 DIAGNOSIS — E669 Obesity, unspecified: Secondary | ICD-10-CM | POA: Diagnosis present

## 2021-02-07 DIAGNOSIS — R1013 Epigastric pain: Secondary | ICD-10-CM

## 2021-02-07 DIAGNOSIS — E039 Hypothyroidism, unspecified: Secondary | ICD-10-CM | POA: Diagnosis present

## 2021-02-07 DIAGNOSIS — K8020 Calculus of gallbladder without cholecystitis without obstruction: Secondary | ICD-10-CM | POA: Diagnosis present

## 2021-02-07 DIAGNOSIS — I129 Hypertensive chronic kidney disease with stage 1 through stage 4 chronic kidney disease, or unspecified chronic kidney disease: Secondary | ICD-10-CM | POA: Diagnosis present

## 2021-02-07 DIAGNOSIS — E876 Hypokalemia: Secondary | ICD-10-CM | POA: Diagnosis not present

## 2021-02-07 DIAGNOSIS — G2581 Restless legs syndrome: Secondary | ICD-10-CM | POA: Diagnosis present

## 2021-02-07 DIAGNOSIS — K759 Inflammatory liver disease, unspecified: Secondary | ICD-10-CM | POA: Diagnosis present

## 2021-02-07 DIAGNOSIS — K760 Fatty (change of) liver, not elsewhere classified: Secondary | ICD-10-CM | POA: Diagnosis present

## 2021-02-07 DIAGNOSIS — Z6830 Body mass index (BMI) 30.0-30.9, adult: Secondary | ICD-10-CM | POA: Diagnosis not present

## 2021-02-07 DIAGNOSIS — Z794 Long term (current) use of insulin: Secondary | ICD-10-CM | POA: Diagnosis not present

## 2021-02-07 DIAGNOSIS — E785 Hyperlipidemia, unspecified: Secondary | ICD-10-CM | POA: Diagnosis present

## 2021-02-07 DIAGNOSIS — N1831 Chronic kidney disease, stage 3a: Secondary | ICD-10-CM | POA: Diagnosis present

## 2021-02-07 DIAGNOSIS — R609 Edema, unspecified: Secondary | ICD-10-CM | POA: Diagnosis not present

## 2021-02-07 DIAGNOSIS — R1011 Right upper quadrant pain: Secondary | ICD-10-CM | POA: Diagnosis not present

## 2021-02-07 DIAGNOSIS — I25118 Atherosclerotic heart disease of native coronary artery with other forms of angina pectoris: Secondary | ICD-10-CM | POA: Diagnosis not present

## 2021-02-07 LAB — CBC WITH DIFFERENTIAL/PLATELET
Abs Immature Granulocytes: 0.06 10*3/uL (ref 0.00–0.07)
Basophils Absolute: 0 10*3/uL (ref 0.0–0.1)
Basophils Relative: 0 %
Eosinophils Absolute: 0 10*3/uL (ref 0.0–0.5)
Eosinophils Relative: 0 %
HCT: 42.3 % (ref 39.0–52.0)
Hemoglobin: 14.3 g/dL (ref 13.0–17.0)
Immature Granulocytes: 1 %
Lymphocytes Relative: 3 %
Lymphs Abs: 0.3 10*3/uL — ABNORMAL LOW (ref 0.7–4.0)
MCH: 30 pg (ref 26.0–34.0)
MCHC: 33.8 g/dL (ref 30.0–36.0)
MCV: 88.9 fL (ref 80.0–100.0)
Monocytes Absolute: 0.4 10*3/uL (ref 0.1–1.0)
Monocytes Relative: 4 %
Neutro Abs: 8.4 10*3/uL — ABNORMAL HIGH (ref 1.7–7.7)
Neutrophils Relative %: 92 %
Platelets: 102 10*3/uL — ABNORMAL LOW (ref 150–400)
RBC: 4.76 MIL/uL (ref 4.22–5.81)
RDW: 13.3 % (ref 11.5–15.5)
WBC: 9.2 10*3/uL (ref 4.0–10.5)
nRBC: 0 % (ref 0.0–0.2)

## 2021-02-07 LAB — AMYLASE: Amylase: 36 U/L (ref 28–100)

## 2021-02-07 LAB — GLUCOSE, CAPILLARY
Glucose-Capillary: 154 mg/dL — ABNORMAL HIGH (ref 70–99)
Glucose-Capillary: 158 mg/dL — ABNORMAL HIGH (ref 70–99)
Glucose-Capillary: 200 mg/dL — ABNORMAL HIGH (ref 70–99)
Glucose-Capillary: 301 mg/dL — ABNORMAL HIGH (ref 70–99)

## 2021-02-07 LAB — COMPREHENSIVE METABOLIC PANEL
ALT: 451 U/L — ABNORMAL HIGH (ref 0–44)
AST: 270 U/L — ABNORMAL HIGH (ref 15–41)
Albumin: 3.4 g/dL — ABNORMAL LOW (ref 3.5–5.0)
Alkaline Phosphatase: 88 U/L (ref 38–126)
Anion gap: 9 (ref 5–15)
BUN: 25 mg/dL — ABNORMAL HIGH (ref 8–23)
CO2: 27 mmol/L (ref 22–32)
Calcium: 8.5 mg/dL — ABNORMAL LOW (ref 8.9–10.3)
Chloride: 98 mmol/L (ref 98–111)
Creatinine, Ser: 1.81 mg/dL — ABNORMAL HIGH (ref 0.61–1.24)
GFR, Estimated: 39 mL/min — ABNORMAL LOW (ref >60)
Glucose, Bld: 315 mg/dL — ABNORMAL HIGH (ref 70–99)
Potassium: 5 mmol/L (ref 3.5–5.1)
Sodium: 134 mmol/L — ABNORMAL LOW (ref 135–145)
Total Bilirubin: 5.7 mg/dL — ABNORMAL HIGH (ref 0.3–1.2)
Total Protein: 6.4 g/dL — ABNORMAL LOW (ref 6.5–8.1)

## 2021-02-07 LAB — MRSA NEXT GEN BY PCR, NASAL: MRSA by PCR Next Gen: NOT DETECTED

## 2021-02-07 LAB — LIPASE, BLOOD: Lipase: 26 U/L (ref 11–51)

## 2021-02-07 MED ORDER — PRAMIPEXOLE DIHYDROCHLORIDE 1 MG PO TABS
2.0000 mg | ORAL_TABLET | Freq: Once | ORAL | Status: AC
  Administered 2021-02-07: 2 mg via ORAL
  Filled 2021-02-07: qty 2

## 2021-02-07 MED ORDER — ACETAMINOPHEN 325 MG PO TABS
650.0000 mg | ORAL_TABLET | Freq: Four times a day (QID) | ORAL | Status: DC | PRN
  Administered 2021-02-07: 650 mg via ORAL
  Filled 2021-02-07: qty 2

## 2021-02-07 MED ORDER — INSULIN GLARGINE-YFGN 100 UNIT/ML ~~LOC~~ SOLN
40.0000 [IU] | Freq: Every day | SUBCUTANEOUS | Status: DC
  Filled 2021-02-07: qty 0.4

## 2021-02-07 MED ORDER — ONDANSETRON HCL 4 MG PO TABS: 4.0000 mg | ORAL_TABLET | Freq: Four times a day (QID) | ORAL | Status: DC | PRN

## 2021-02-07 MED ORDER — LORAZEPAM 2 MG/ML IJ SOLN
1.0000 mg | Freq: Once | INTRAMUSCULAR | Status: AC
  Administered 2021-02-07: 1 mg via INTRAVENOUS
  Filled 2021-02-07: qty 1

## 2021-02-07 MED ORDER — LACTATED RINGERS IV SOLN: INTRAVENOUS | Status: DC

## 2021-02-07 MED ORDER — PANTOPRAZOLE SODIUM 40 MG IV SOLR
40.0000 mg | INTRAVENOUS | Status: DC
  Administered 2021-02-07 – 2021-02-10 (×3): 40 mg via INTRAVENOUS
  Filled 2021-02-07 (×2): qty 40

## 2021-02-07 MED ORDER — CLOPIDOGREL BISULFATE 75 MG PO TABS
75.0000 mg | ORAL_TABLET | Freq: Every day | ORAL | Status: DC
  Administered 2021-02-07 – 2021-02-11 (×4): 75 mg via ORAL
  Filled 2021-02-07 (×4): qty 1

## 2021-02-07 MED ORDER — INSULIN GLARGINE-YFGN 100 UNIT/ML ~~LOC~~ SOLN
40.0000 [IU] | Freq: Every day | SUBCUTANEOUS | Status: DC
  Administered 2021-02-07 – 2021-02-11 (×4): 40 [IU] via SUBCUTANEOUS
  Filled 2021-02-07 (×5): qty 0.4

## 2021-02-07 MED ORDER — HYDRALAZINE HCL 20 MG/ML IJ SOLN: 5.0000 mg | INTRAMUSCULAR | Status: DC | PRN

## 2021-02-07 MED ORDER — BASAGLAR KWIKPEN 100 UNIT/ML ~~LOC~~ SOPN: 40.0000 [IU] | PEN_INJECTOR | Freq: Every day | SUBCUTANEOUS | Status: DC

## 2021-02-07 MED ORDER — DOCUSATE SODIUM 100 MG PO CAPS
100.0000 mg | ORAL_CAPSULE | Freq: Two times a day (BID) | ORAL | Status: DC
  Administered 2021-02-08 – 2021-02-10 (×5): 100 mg via ORAL
  Filled 2021-02-07 (×5): qty 1

## 2021-02-07 MED ORDER — HYDROCODONE-ACETAMINOPHEN 5-325 MG PO TABS: 1.0000 | ORAL_TABLET | ORAL | Status: DC | PRN

## 2021-02-07 MED ORDER — INSULIN ASPART 100 UNIT/ML IJ SOLN
0.0000 [IU] | Freq: Three times a day (TID) | INTRAMUSCULAR | Status: DC
  Administered 2021-02-07: 11 [IU] via SUBCUTANEOUS
  Administered 2021-02-07: 3 [IU] via SUBCUTANEOUS
  Administered 2021-02-08: 2 [IU] via SUBCUTANEOUS
  Administered 2021-02-09 (×2): 5 [IU] via SUBCUTANEOUS
  Administered 2021-02-10: 8 [IU] via SUBCUTANEOUS
  Administered 2021-02-10: 5 [IU] via SUBCUTANEOUS
  Administered 2021-02-10: 8 [IU] via SUBCUTANEOUS
  Administered 2021-02-11: 15 [IU] via SUBCUTANEOUS
  Administered 2021-02-11 (×2): 5 [IU] via SUBCUTANEOUS

## 2021-02-07 MED ORDER — OXYCODONE HCL 5 MG PO TABS
5.0000 mg | ORAL_TABLET | ORAL | Status: DC | PRN
  Administered 2021-02-08 (×2): 5 mg via ORAL
  Filled 2021-02-07 (×2): qty 1

## 2021-02-07 MED ORDER — ONDANSETRON HCL 4 MG/2ML IJ SOLN: 4.0000 mg | Freq: Four times a day (QID) | INTRAMUSCULAR | Status: DC | PRN

## 2021-02-07 MED ORDER — MORPHINE SULFATE (PF) 2 MG/ML IV SOLN
2.0000 mg | INTRAVENOUS | Status: DC | PRN
  Administered 2021-02-08: 2 mg via INTRAVENOUS
  Filled 2021-02-07: qty 1

## 2021-02-07 MED ORDER — METOPROLOL SUCCINATE ER 25 MG PO TB24
25.0000 mg | ORAL_TABLET | Freq: Every day | ORAL | Status: DC
  Administered 2021-02-07 – 2021-02-11 (×5): 25 mg via ORAL
  Filled 2021-02-07 (×5): qty 1

## 2021-02-07 MED ORDER — ALBUTEROL SULFATE (2.5 MG/3ML) 0.083% IN NEBU: 3.0000 mL | INHALATION_SOLUTION | Freq: Four times a day (QID) | RESPIRATORY_TRACT | Status: DC | PRN

## 2021-02-07 MED ORDER — POLYETHYLENE GLYCOL 3350 17 G PO PACK: 17.0000 g | PACK | Freq: Every day | ORAL | Status: DC | PRN

## 2021-02-07 MED ORDER — BISACODYL 5 MG PO TBEC: 5.0000 mg | DELAYED_RELEASE_TABLET | Freq: Every day | ORAL | Status: DC | PRN

## 2021-02-07 MED ORDER — GABAPENTIN 100 MG PO CAPS
200.0000 mg | ORAL_CAPSULE | Freq: Every day | ORAL | Status: DC
  Administered 2021-02-07 – 2021-02-10 (×4): 200 mg via ORAL
  Filled 2021-02-07 (×4): qty 2

## 2021-02-07 MED ORDER — INSULIN ASPART 100 UNIT/ML IJ SOLN
0.0000 [IU] | Freq: Every day | INTRAMUSCULAR | Status: DC
  Administered 2021-02-08: 5 [IU] via SUBCUTANEOUS
  Administered 2021-02-09 – 2021-02-10 (×2): 2 [IU] via SUBCUTANEOUS

## 2021-02-07 MED ORDER — ACETAMINOPHEN 650 MG RE SUPP: 650.0000 mg | Freq: Four times a day (QID) | RECTAL | Status: DC | PRN

## 2021-02-07 MED ORDER — GABAPENTIN 100 MG PO CAPS
100.0000 mg | ORAL_CAPSULE | Freq: Once | ORAL | Status: AC
  Administered 2021-02-07: 100 mg via ORAL
  Filled 2021-02-07: qty 1

## 2021-02-07 NOTE — Progress Notes (Addendum)
Inpatient Diabetes Program Recommendations  AACE/ADA: New Consensus Statement on Inpatient Glycemic Control (2015)  Target Ranges:  Prepandial:   less than 140 mg/dL      Peak postprandial:   less than 180 mg/dL (1-2 hours)      Critically ill patients:  140 - 180 mg/dL   Lab Results  Component Value Date   GLUCAP 255 (H) 02/01/2021   HGBA1C 7.9 (H) 01/15/2021    Review of Glycemic Control  Latest Reference Range & Units 02/01/21 21:34  Glucose-Capillary 70 - 99 mg/dL 578 (H)   Diabetes history: DM 2 Outpatient Diabetes medications:  Farxiga 10 mg daily Novolog 15-20 units tid with meals Basaglar 40 units q HS Current orders for Inpatient glycemic control:  Novolog moderate tid with meals and HS Semglee 40 units q HS  Inpatient Diabetes Program Recommendations:    Note patient has CGM.  May wear in the hospital, with validation using hospital CBG meter.  May need to be removed for imaging. Recommend moving Semglee to 40 units daily (so he can go ahead and get first dose now).  Called and discussed with RN.   Thanks,  Beryl Meager, RN, BC-ADM Inpatient Diabetes Coordinator Pager [phone]  (8a-5p)

## 2021-02-07 NOTE — H&P (Addendum)
History and Physical    Kenneth Schueneman Sr. [id number] DOB: 1950/05/28 DOA: 02/06/2021  PCP: Lauree Chandler, NP Consultants:  Einar Gip - cardiology; Loanne Drilling - endocrinology; Percell Miller - orthopedics Patient coming from:  Home - lives with wife; NOK: Wife, [name] [place]  Chief Complaint: Abdominal pain  HPI: Kenneth Rhue Sr. is a 70 y.o. male with medical history significant of HLD; CAD s/p stent; DM; RLS; stage 3 CKD; and recent admission for TIA presenting with abdominal pain.   He has been having epigastric pain for about 3 months.  It radiates into his chest, sharp knife-like pain.  He notices it with exertion.  The last 2 times it has been excruciating in the mid-epigastric region.  Yesterday, he ran a fever to 105 with it.  Now, he has no pain.  If he gets up and tries to do stuff, the pain will recur.  Yesterday, he loaded his vehicle and he noticed the pain and it worsened through the day as he did more and more.  No connection with eating.  He has normal BMs.  He did have some nausea and dry heaves once yesterday, but this is the only time this has happened.  No subsequent fevers.  Gaining weight.  No night sweats.  He still has his gallbladder.  He reports that he had RLS but was given Lipitor and it caused it to dramatically worsen.  He has required "to be put to sleep" for prior MRIs.  However, for his CT last night they were able to wrap his upper legs with blankets and this stilled them enough for the study.    ED Course: [facility] transfer, per Dr. Tonie Griffith:  70 yo male with DMT2, HTN presents with abdominal pain for past few days that worsened today. Seen by Cardiology as outpatient few days ago and scheduled for outpatient stress test per ER doctor report. Has elevated LFTs today and low grade fever. Has cholelithiasis but no acute cholecystitis on gallbladder u/s and CT abdomen. GI sent secure chat to evaluate in am by ER.  Will need HIDA scan vs MRCP. Given antibiotic in  ER  Review of Systems: As per HPI; otherwise review of systems reviewed and negative.   Ambulatory Status:  Ambulates without assistance  COVID Vaccine Status:  None  Past Medical History:  Diagnosis Date   Arthritis    "left thumb" (05/23/2017) right shoulder   Barrett's esophagus    "we've been told that it's all gone; still take RX for GERD" (05/23/2017)   Bilateral swelling of feet and ankles x 3 weeks as of 12-01-2019   CAD (coronary artery disease), native coronary artery    Coronary angiogram 05/03/2014:  Proximal LAD 3.0x12 mm Promus premier stent.  04/08/2014: Mid Cx 3.5 x 16 mm Promus DES.  03/29/2013: Mid LAD 2.75 x 38 mm Promus Premier drug-eluting stent, balloon angioplasty of D1 and distal LAD and stenting of distal RCA with 2.75 x 24 mm promos Premier drug-eluting stent 12/15/2012 widely patent.   Cervicalgia    Chronic bronchitis ([place])    "q yr" ([id number])   Complication of anesthesia    " I do not wake up very well "   COVID-19 05/2019   all covid symptoms in hospital x 5 days, all symptoms resolved took monoclonal antibody tx    Family history of adverse reaction to anesthesia    "son w/PONV"   GERD (gastroesophageal reflux disease)    Heart murmur    "grew out  of it"    Hyperlipidemia    Hypoglycemia, unspecified    IDDM (insulin dependent diabetes mellitus)    type 2   Impotence of organic origin    Iron deficiency anemia    Other malaise and fatigue    Pneumonia 05/2019   PONV (postoperative nausea and vomiting)    after wisdom teeth pulled   Restless leg    Rotator cuff arthropathy    Tension headache    "sometimes" (05/23/2017)   Unspecified gastritis and gastroduodenitis with hemorrhage    Unstable angina pectoris (HCC) 04/08/2014   Coronary angiogram 05/03/2014:  Proximal LAD 3.0x12 mm Promus premier stent.  04/08/2014: Mid Cx 3.5 x 16 mm Promus DES.  03/29/2013: Mid LAD 2.75 x 38 mm Promus Premier drug-eluting stent, balloon angioplasty of D1 and distal LAD and  stenting of distal RCA with 2.75 x 24 mm promos Premier drug-eluting stent 12/15/2012 widely patent.    Past Surgical History:  Procedure Laterality Date   BICEPT TENODESIS Right 12/07/2019   Procedure: RIGHT BICEPS TENODESIS;  Surgeon: Sheral Apley, MD;  Location: [facility];  Service: Orthopedics;  Laterality: Right;   CARDIAC CATHETERIZATION N/A 09/25/2015   Procedure: Left Heart Cath and Coronary Angiography;  Surgeon: Cynthia Stainback Decamp, MD;  Location: [facility] INVASIVE CV LAB;  Service: Cardiovascular;  Laterality: N/A;   CARDIAC CATHETERIZATION N/A 09/25/2015   Procedure: Intravascular Pressure Wire/FFR Study;  Surgeon: Tieasha Larsen Decamp, MD;  Location: [facility] INVASIVE CV LAB;  Service: Cardiovascular;  Laterality: N/A;   CORONARY ANGIOPLASTY WITH STENT PLACEMENT  12/15/2012; 04/28/2014; 05/03/2014   "2; 1; 1" , 4 stents 2 balloons   FRACTIONAL FLOW RESERVE WIRE Right 03/26/2013   Procedure: FRACTIONAL FLOW RESERVE WIRE;  Surgeon: Pamella Pert, MD;  Location: [facility] CATH LAB;  Service: Cardiovascular;  Laterality: Right;   FRACTIONAL FLOW RESERVE WIRE N/A 05/03/2014   Procedure: FRACTIONAL FLOW RESERVE WIRE;  Surgeon: Pamella Pert, MD;  Location: [facility] CATH LAB;  Service: Cardiovascular;  Laterality: N/A;   HERNIA REPAIR  2010   "umbilical"    INCISION AND DRAINAGE ABSCESS Left 05/2007   "groin"    KNEE SURGERY Right 1992;  2000   "calcium deposits removed" (12/15/2012)   LEFT HEART CATH AND CORONARY ANGIOGRAPHY N/A 05/23/2017   Procedure: LEFT HEART CATH AND CORONARY ANGIOGRAPHY;  Surgeon: Suhaas Agena Decamp, MD;  Location: [facility] INVASIVE CV LAB;  Service: Cardiovascular;  Laterality: N/A;   LEFT HEART CATHETERIZATION WITH CORONARY ANGIOGRAM N/A 12/15/2012   Procedure: LEFT HEART CATHETERIZATION WITH CORONARY ANGIOGRAM;  Surgeon: Pamella Pert, MD;  Location: [facility] CATH LAB;  Service: Cardiovascular;  Laterality: N/A;   LEFT HEART CATHETERIZATION WITH CORONARY ANGIOGRAM N/A 03/26/2013   Procedure: LEFT  HEART CATHETERIZATION WITH CORONARY ANGIOGRAM;  Surgeon: Pamella Pert, MD;  Location: [facility] CATH LAB;  Service: Cardiovascular;  Laterality: N/A;   LEFT HEART CATHETERIZATION WITH CORONARY ANGIOGRAM N/A 04/08/2014   Procedure: LEFT HEART CATHETERIZATION WITH CORONARY ANGIOGRAM;  Surgeon: Micheline Chapman, MD;  Location: [facility] CATH LAB;  Service: Cardiovascular;  Laterality: N/A;   PERCUTANEOUS CORONARY STENT INTERVENTION (PCI-S) N/A 05/03/2014   Procedure: PERCUTANEOUS CORONARY STENT INTERVENTION (PCI-S);  Surgeon: Pamella Pert, MD;  Location: [facility] CATH LAB;  Service: Cardiovascular;  Laterality: N/A;   ROTATOR CUFF REPAIR     UMBILICAL HERNIA REPAIR  yrs ago    Social History   Socioeconomic History   Marital status: Married    Spouse name: Pam   Number of children:  2   Years of education: Not on file   Highest education level: Not on file  Occupational History   Occupation: Truck Geophysicist/field seismologist    Comment: deliveries only now   Occupation: woodworking  Tobacco Use   Smoking status: Never   Smokeless tobacco: Never  Vaping Use   Vaping Use: Never used  Substance and Sexual Activity   Alcohol use: No   Drug use: No   Sexual activity: Not Currently  Other Topics Concern   Not on file  Social History Narrative   Not on file   Social Determinants of Health   Financial Resource Strain: Low Risk    Difficulty of Paying Living Expenses: Not very hard  Food Insecurity: No Food Insecurity   Worried About Charity fundraiser in the Last Year: Never true   [place] in the Last Year: Never true  Transportation Needs: No Transportation Needs   Lack of Transportation (Medical): No   Lack of Transportation (Non-Medical): No  Physical Activity: Not on file  Stress: Not on file  Social Connections: Not on file  Intimate Partner Violence: Not on file    Allergies  Allergen Reactions   Lyrica [Pregabalin] Other (See Comments)    Made patient very lethargic the next morning, very hard  to patient to function, move, etc.    Statins Other (See Comments)    Causes Restless Legs, rhabdomyolysis   Trulicity [Dulaglutide] Other (See Comments)    GI side effects     Family History  Adopted: Yes  Problem Relation Age of Onset   Emphysema Mother     Prior to Admission medications   Medication Sig Start Date End Date Taking? Authorizing Provider  albuterol (PROVENTIL HFA;VENTOLIN HFA) 108 (90 Base) MCG/ACT inhaler Inhale 2 puffs into the lungs every 6 (six) hours as needed for wheezing or shortness of breath. 06/08/18   Reed, Tiffany L, DO  Alirocumab (PRALUENT) 150 MG/ML SOAJ Inject 1 pen into the skin every 14 (fourteen) days. 12/01/19   Adrian Prows, MD  BD PEN NEEDLE NANO 2ND GEN 32G X 4 MM MISC USE AS DIRECTED 04/12/20   Reed, Tiffany L, DO  Cholecalciferol (D-5000) 125 MCG (5000 UT) TABS Take 1 tablet (5,000 Units total) by mouth daily. E55.9 01/18/21   Lauree Chandler, NP  clopidogrel (PLAVIX) 75 MG tablet Take 1 tablet (75 mg total) by mouth daily. 01/16/21 02/15/21  Swayze, Ava, DO  Continuous Blood Gluc Receiver (FREESTYLE LIBRE 14 DAY READER) DEVI Inject 1 Device as directed daily as needed. Use once daily as direct to check blood sugar E11.51 05/03/19   Reed, Tiffany L, DO  Continuous Blood Gluc Sensor (FREESTYLE LIBRE 14 DAY SENSOR) MISC Use to test blood sugar three times daily. E11.51. E11.59 08/29/20   Lauree Chandler, NP  dapagliflozin propanediol (FARXIGA) 10 MG TABS tablet Take 1 tablet (10 mg total) by mouth daily before breakfast. 12/25/20   Renato Shin, MD  famotidine (PEPCID) 20 MG tablet TAKE 1 TABLET BY MOUTH EVERY DAY Patient taking differently: Take 20 mg by mouth daily. 07/06/19   Reed, Tiffany L, DO  fluticasone (FLONASE) 50 MCG/ACT nasal spray Place 1 spray into both nostrils daily as needed for allergies or rhinitis.    [provider]  gabapentin (NEURONTIN) 100 MG capsule Take 2 capsules (200 mg total) by mouth at bedtime. 09/05/20   Lauree Chandler, NP  glucose 4 GM chewable tablet Chew 1 tablet (4 g total)  by mouth as needed for low blood sugar. 07/28/20   Fargo, Amy E, NP  insulin aspart (NOVOLOG FLEXPEN) 100 UNIT/ML FlexPen Inject 15-20 Units into the skin 3 (three) times daily with meals. 09/14/20   Renato Shin, MD  Insulin Glargine [facility]) 100 UNIT/ML Inject 40 units subcutaneously into the skin at bedtime 07/28/20   Lauree Chandler, NP  [facility] FINEPOINT LANCETS MISC Use to test for blood sugar three times daily dx: E11.51 06/01/18   Lauree Chandler, NP  metoprolol succinate (TOPROL-XL) 25 MG 24 hr tablet Take 0.5 tablets (12.5 mg total) by mouth daily. Take with or immediately following a meal. Patient taking differently: Take 25 mg by mouth daily. Take with or immediately following a meal. 01/15/21 05/15/21  Swayze, Ava, DO  nitroGLYCERIN (NITROSTAT) 0.4 MG SL tablet Place 1 tablet (0.4 mg total) under the tongue every 5 (five) minutes as needed for chest pain. 10/04/20   Lauree Chandler, NP  ONETOUCH ULTRA test strip USE TO TEST FOR BLOOD SUGAR THREE TIMES DAILY DX: E11.51 12/04/18   Reed, Tiffany L, DO  pantoprazole (PROTONIX) 40 MG tablet Take one tablet by mouth once daily for stomach Patient taking differently: Take 40 mg by mouth daily. 09/25/20   Lauree Chandler, NP  pramipexole (MIRAPEX) 1 MG tablet TAKE 1 TABLET BY MOUTH AT 5PM, 1 TABLET BY MOUTH AT 7PM, AND TAKE 2 TABLETS BY MOUTH AT BEDTIME. Patient taking differently: Take 1-2 mg by mouth See admin instructions. Take 1mg  at 5pm, 1mg  at 7pm, and 2mg  at bedtime. 11/23/20   Lauree Chandler, NP  sucralfate (CARAFATE) 1 g tablet Take 1 tablet (1 g total) by mouth 4 (four) times daily -  with meals and at bedtime for 7 days. Patient not taking: Reported on 02/05/2021 02/01/21 02/08/21  Wynona Dove A, DO  valsartan (DIOVAN) 160 MG tablet Take 1 tablet (160 mg total) by mouth every evening. 06/02/20   Adrian Prows, MD    Physical Exam: Vitals:   02/07/21  0415 02/07/21 0445 02/07/21 0605 02/07/21 0729  BP: 109/70 109/60 119/70 100/67  Pulse: 77 73 81 78  Resp: 20 (!) 21 20 14   Temp:   99.2 F (37.3 C) 98.3 F (36.8 C)  TempSrc:   Oral Oral  SpO2: 96% 96% 96% 98%     General:  Appears calm and comfortable and is in NAD, no obvious jaundice Eyes:   EOMI, normal lids, iris, minimal scleral icterus ENT:  grossly normal hearing, lips & tongue, mmm; appropriate dentition Neck:  no LAD, masses or thyromegaly Cardiovascular:  RRR, no m/r/g. No LE edema.  Respiratory:   CTA bilaterally with no wheezes/rales/rhonchi.  Normal respiratory effort. Abdomen:  soft, NT, ND, NABS Skin:  no rash or induration seen on limited exam Musculoskeletal:  grossly normal tone BUE/BLE, good ROM, no bony abnormality Psychiatric:  grossly normal mood and affect, speech fluent and appropriate, AOx3 Neurologic:  CN 2-12 grossly intact, moves all extremities in coordinated fashion, periodic LE myoclonic jerks    Radiological Exams on Admission: Independently reviewed - see discussion in A/P where applicable  CT ABDOMEN PELVIS W CONTRAST  Result Date: 02/06/2021 CLINICAL DATA:  Abdominal pain, fever.  History of hernia repair EXAM: CT ABDOMEN AND PELVIS WITH CONTRAST TECHNIQUE: Multidetector CT imaging of the abdomen and pelvis was performed using the standard protocol following bolus administration of intravenous contrast. CONTRAST:  113mL OMNIPAQUE IOHEXOL 300 MG/ML  SOLN COMPARISON:  CT abdomen pelvis 04/20/2018  FINDINGS: Lower chest: No acute abnormality. Hepatobiliary: The hepatic parenchyma is diffusely mildly hypodense compared to the splenic parenchyma consistent with fatty infiltration. No focal liver abnormality. Gallstone noted within the gallbladder lumen. No gallbladder wall thickening or pericholecystic fluid. No biliary dilatation. Pancreas: No focal lesion. Normal pancreatic contour. No surrounding inflammatory changes. No main pancreatic ductal  dilatation. Spleen: Normal in size without focal abnormality. Adrenals/Urinary Tract: No adrenal nodule bilaterally. Bilateral kidneys enhance symmetrically. There is a 1.4 cm left renal lesion with a density of 25 Hounsfield units. No hydronephrosis. No hydroureter. The urinary bladder is unremarkable. On delayed imaging, there is no urothelial wall thickening and there are no filling defects in the opacified portions of the bilateral collecting systems or ureters. Stomach/Bowel: Stomach is within normal limits. No evidence of bowel wall thickening or dilatation. Appendix appears normal. Vascular/Lymphatic: No abdominal aorta or iliac aneurysm. Moderate to severe atherosclerotic plaque of the aorta and its branches. No abdominal, pelvic, or inguinal lymphadenopathy. Reproductive: Prostate is unremarkable. Other: No intraperitoneal free fluid. No intraperitoneal free gas. No organized fluid collection. Musculoskeletal: No abdominal wall hernia or abnormality. No suspicious lytic or blastic osseous lesions. No acute displaced fracture. Multilevel degenerative changes of the spine. IMPRESSION: 1. Cholelithiasis with no CT findings of acute cholecystitis. 2. Possible mild hepatic steatosis. 3. Indeterminate 1.4 cm left renal lesion. Recommend MRI renal protocol for further evaluation. 4.  Aortic Atherosclerosis (ICD10-I70.0). Electronically Signed   By: Iven Finn M.D.   On: 02/06/2021 19:40   US Abdomen Limited  Result Date: 02/06/2021 CLINICAL DATA:  Right upper quadrant pain EXAM: ULTRASOUND ABDOMEN LIMITED RIGHT UPPER QUADRANT COMPARISON:  CT 04/20/2018 FINDINGS: Gallbladder: No gallstones or wall thickening visualized. No sonographic Murphy sign noted by sonographer. Common bile duct: Diameter: 5.6 mm Liver: Diffusely echogenic. Suspicion of surface nodularity portal vein is patent on color Doppler imaging with normal direction of blood flow towards the liver. Other: None. IMPRESSION: 1. Negative for  gallstones. 2. Echogenic liver consistent with hepatic steatosis and or hepatocellular disease. Possible subtle contour nodularity as may be seen with cirrhosis Electronically Signed   By: Donavan Foil M.D.   On: 02/06/2021 17:38    EKG: Independently reviewed.  NSR with rate 84; nonspecific ST changes with no evidence of acute ischemia, NSCSLT   Labs on Admission: I have personally reviewed the available labs and imaging studies at the time of the admission.  Pertinent labs:   Glucose 277 BUN 19/Creatinine 1.48/GFR 50; 22/1.21/>60 on 12/1 AST 474/ALT 356/Bili 3.5; 270/451/5.7 HS troponin 14, 13 WBC 9.7 Platelets 112 UA >1000 glucose, trace protein COVID/flu negative   Assessment/Plan Principal Problem:   Elevated LFTs Active Problems:   Coronary artery disease of native artery of native heart with stable angina pectoris (HCC)   Diabetes mellitus type 2, insulin dependent (HCC)   HLD (hyperlipidemia)   Restless leg syndrome   AKI (acute kidney injury) ([place])   Elevated LFTs -Patient with a very atypical presentation of exertional epigastric pain -He has been seen by cardiology and is planned for outpatient ischemic evaluation -Meanwhile, pain worsened and was debilitating yesterday and so he came to the ER -LFTs were significantly elevated -[place] was negative for gallstones but showed apparent hepatocellular liver disease -CT with cholelithiasis without acute cholecystitis -Patient was transferred from [place] for admission to [facility] -Repeat labs today show mild improvement in AST but worsening in ALT and bili -GI is consulting; he appears to need labs including hepatitis panel -Will order MRCP for  further evaluation -Hold PO Protonix and change to IV Protonix daily for now -NPO for now unless GI feels otherwise  AKI -Patient has baseline stage 3a CKD but this has worsened on admission and moreso with today's labs -Will rehydrate with LR at 100 cc/hr and recheck this  afternoon and in AM -He was noted to have 1.4 cm left renal lesion on CT and this will need further evaluation with MRI renal protocol, so will add that on to complete with MRCP  CAD -s/p stent -Continue Plavix -His history is more c/w cardiac disease but he had negative troponin x 2 -He saw Dr. Einar Gip on 12/5 with plan for cath vs. Stress test; the patient preferred stress testing -Echo was done on 11/2 and pretty unremarkable -Will request cardiology consult to ensure that ischemic testing is not needed prior to GI-related procedures  HTN -Continue Toprol XL -Hold Diovan and Farxiga in the setting of renal dysfunction  RLS -Patient reports worsening with initiation of Lipitor  -He has continuous LE myoclonic jerks -He takes a custom Mirapex schedule and this has been continued -Will try blanket wrapping of upper legs in conjunction with Ativan for MRI in an attempt to avoid general anesthesia  DM -Recent A1c was 7.3, indicating reasonable but not optimal control -hold Farxiga -Continue glargine -May utilize (home) continuous glucose monitoring system -Cover with moderate-scale SSI  -Continue Neurontin  HLD -He reports that Lipitor led to worsening of his RLS -He is now taking Praluent  Obesity -BMI is 30.47 -Weight loss should be encouraged -Outpatient PCP/bariatric medicine f/u encouraged     Note: This patient has been tested and is negative for the novel coronavirus COVID-19. The patient has NOT been vaccinated against COVID-19.   Level of care: Med-Surg DVT prophylaxis: SCDs Code Status:  Limited (DNI) - confirmed with patient Family Communication: None present Disposition Plan:  The patient is from: home  Anticipated d/c is to: home without [facility] services   Anticipated d/c date will depend on clinical response to treatment, likely 2-3 days  Patient is currently: acutely ill Consults called: GI; cardiology  Admission status:  Admit - It is my clinical opinion  that admission to [facility] is reasonable and necessary because of the expectation that this patient will require hospital care that crosses at least 2 midnights to treat this condition based on the medical complexity of the problems presented.  Given the aforementioned information, the predictability of an adverse outcome is felt to be significant.    Karmen Bongo MD [facility]   How to contact the [facility] Attending or Consulting provider [place] or covering provider during after hours [place], for this patient?  Check the care team in [facility] and look for a) attending/consulting [facility] provider listed and b) the [facility] team listed Log into [email or web address] and use [place]'s universal password to access. If you do not have the password, please contact the hospital operator. Locate the [facility] provider you are looking for under [facility] and page to a number that you can be directly reached. If you still have difficulty reaching the provider, please page the [facility] (Director on Call) for the Hospitalists listed on amion for assistance.   02/07/2021, 9:52 AM

## 2021-02-07 NOTE — Progress Notes (Signed)
Patient given ativan for MRI this am.  Ativan made his restless leg syndrome markedly worsened and patient unable to complete MRI.  Discussed with Dr. Ophelia Charter and she states that patient will have MRI under anesthesia.  Patient and wife aware of this and seem to be in agreement.  An extra dose of mirapex and a dose of gabapentin given due to the severe restless legs, in which patient was continually shaking, jerking, and having to kick his legs.  These medications decreased the restless legs.  Patient has not had any further abdominal pain this shift, but has been NPO and not very active, he states activity usually worsens the pain.  Now resting quietly without s/s distress.

## 2021-02-07 NOTE — Consult Note (Addendum)
Consultation  Referring Provider: Dr. Lorin Mercy     Primary Care Physician:  Lauree Chandler, NP Primary Gastroenterologist: Althia Forts, Dr. Lyda Jester       Reason for Consultation:     Elevated LFTs, Epigastric Pain         HPI:   Kenneth Matheney Sr. is a 70 y.o. male with a past medical history of CAD on Plavix, GERD, Barrett's esophagus and multiple others, who presented to the ER at [facility] on 02/06/2021 with epigastric pain that has been ongoing for months and worsened on 12/5.  We are consulted in regards to pain and elevated LFT's.    At time of presentation patient described that he had epigastric pain for several months however this worsened yesterday morning around 6 AM and seemed to be exacerbated whenever he would try to move.  Did describe lifting some heavy boxes into his [place] Lei and the pain started and worsened.  Then describes that when he was getting into his car he started to feel nauseated but did not have any vomiting.  Apparently has history of an ulcer and a hernia removal.    Today, the patient was seen with his wife at his bedside, who does assist with history.  Together they explain that for the past 2 to 3 months he has been having an epigastric pain which seems to radiate up into his chest only with exertion.  He has been following with his cardiologist in regards to this and they did an echo which was normal, plans were for a stress test next to make sure this had nothing to do with his known CAD.  Describes that he has had 4 stents in the past with the last one in 2016.  Apparently recently restarted on Plavix.  Tells me that this pain seemed to worsen on Friday and became unbearable over the weekend.  It worsened on Monday when he was putting something into his Lucianne Lei and he ended up at the hospital.  Tells me he was running a fever yesterday of 105.      Does have chronic heartburn and reflux symptoms for which he takes Pantoprazole 40 mg once daily.  Apparently had "an ulcer  years ago", and was initially on twice daily therapy but this got decreased to once daily about a year ago.  Describes having regular EGDs and colonoscopies with his last of both 3 years ago.  Since being in the hospital patient has just been "laying in the bed" and tells me he has had no pain.  The pain he experiences does not get any worse when he tries to eat anything.  The only time he really notices it is if he tries to exert himself.     Denies fever, chills, weight loss, change in bowel habits or blood in his stool.  ER course: AST 474, ALT 356, total bili 3.5, creatinine 1.48, normal lipase and troponins; CT abdomen pelvis with contrast shows cholelithiasis with no CT findings of acute cholecystitis, possible mild hepatic steatosis, indeterminate 1.4 cm left renal lesion and aortic atherosclerosis; right upper quadrant ultrasound showed negative for gallstones, echogenic liver consistent with hepatic steatosis and/or hepatocellular disease with possible subtle contour nodularity as may be seen with cirrhosis, during his stay in the hospital patient's blood pressure softened and there were suspicion of possible "infected stone" he was then transferred to [facility] for MRCP  GI history: At outside facility, cannot see records  Past Medical History:  Diagnosis Date  Arthritis    "left thumb" (05/23/2017) right shoulder   Barrett's esophagus    "we've been told that it's all gone; still take RX for GERD" (05/23/2017)   Bilateral swelling of feet and ankles x 3 weeks as of 12-01-2019   CAD (coronary artery disease), native coronary artery    Coronary angiogram 05/03/2014:  Proximal LAD 3.0x12 mm Promus premier stent.  04/08/2014: Mid Cx 3.5 x 16 mm Promus DES.  03/29/2013: Mid LAD 2.75 x 38 mm Promus Premier drug-eluting stent, balloon angioplasty of D1 and distal LAD and stenting of distal RCA with 2.75 x 24 mm promos Premier drug-eluting stent 12/15/2012 widely patent.   Cervicalgia    Chronic bronchitis  ([place])    "q yr" (06/28/621)   Complication of anesthesia    " I do not wake up very well "   COVID-19 05/2019   all covid symptoms in hospital x 5 days, all symptoms resolved took monoclonal antibody tx    Family history of adverse reaction to anesthesia    "son w/PONV"   GERD (gastroesophageal reflux disease)    Heart murmur    "grew out of it"    Hyperlipidemia    Hypoglycemia, unspecified    IDDM (insulin dependent diabetes mellitus)    type 2   Impotence of organic origin    Iron deficiency anemia    Other malaise and fatigue    Pneumonia 05/2019   PONV (postoperative nausea and vomiting)    after wisdom teeth pulled   Restless leg    Rotator cuff arthropathy    Tension headache    "sometimes" (05/23/2017)   Unspecified gastritis and gastroduodenitis with hemorrhage    Unstable angina pectoris ([place]) 04/08/2014   Coronary angiogram 05/03/2014:  Proximal LAD 3.0x12 mm Promus premier stent.  04/08/2014: Mid Cx 3.5 x 16 mm Promus DES.  03/29/2013: Mid LAD 2.75 x 38 mm Promus Premier drug-eluting stent, balloon angioplasty of D1 and distal LAD and stenting of distal RCA with 2.75 x 24 mm promos Premier drug-eluting stent 12/15/2012 widely patent.    Past Surgical History:  Procedure Laterality Date   BICEPT TENODESIS Right 12/07/2019   Procedure: RIGHT BICEPS TENODESIS;  Surgeon: Renette Butters, MD;  [facility];  Service: Orthopedics;  Laterality: Right;   CARDIAC CATHETERIZATION N/A 09/25/2015   Procedure: Left Heart Cath and Coronary Angiography;  Surgeon: Adrian Prows, MD;  Location: [place] CV [facility];  Service: Cardiovascular;  Laterality: N/A;   CARDIAC CATHETERIZATION N/A 09/25/2015   Procedure: Intravascular Pressure Wire/FFR Study;  Surgeon: Adrian Prows, MD;  Location: [place] [facility];  Service: Cardiovascular;  Laterality: N/A;   CORONARY ANGIOPLASTY WITH STENT PLACEMENT  12/15/2012; 04/28/2014; 05/03/2014   "2; 1; 1" , 4 stents 2 balloons   FRACTIONAL  FLOW RESERVE WIRE Right 03/26/2013   Procedure: FRACTIONAL FLOW RESERVE WIRE;  Surgeon: Laverda Page, MD;  Location: [facility] CATH LAB;  Service: Cardiovascular;  Laterality: Right;   FRACTIONAL FLOW RESERVE WIRE N/A 05/03/2014   Procedure: FRACTIONAL FLOW RESERVE WIRE;  Surgeon: Laverda Page, MD;  Location: [facility] CATH LAB;  Service: Cardiovascular;  Laterality: N/A;   HERNIA REPAIR  [id number]   "umbilical"    INCISION AND DRAINAGE ABSCESS Left 05/2007   "groin"    KNEE SURGERY Right 1992;  2000   "calcium deposits removed" (12/15/2012)   LEFT HEART CATH AND CORONARY ANGIOGRAPHY N/A 05/23/2017   Procedure: LEFT HEART CATH AND CORONARY ANGIOGRAPHY;  Surgeon: Einar Gip,  Ulice Dash, MD;  Location: [place] [facility];  Service: Cardiovascular;  Laterality: N/A;   LEFT HEART CATHETERIZATION WITH CORONARY ANGIOGRAM N/A 12/15/2012   Procedure: LEFT HEART CATHETERIZATION WITH CORONARY ANGIOGRAM;  Surgeon: Laverda Page, MD;  Location: [facility] CATH LAB;  Service: Cardiovascular;  Laterality: N/A;   LEFT HEART CATHETERIZATION WITH CORONARY ANGIOGRAM N/A 03/26/2013   Procedure: LEFT HEART CATHETERIZATION WITH CORONARY ANGIOGRAM;  Surgeon: Laverda Page, MD;  Location[facility] LAB;  Service: Cardiovascular;  Laterality: N/A;   LEFT HEART CATHETERIZATION WITH CORONARY ANGIOGRAM N/A 04/08/2014   Procedure: LEFT HEART CATHETERIZATION WITH CORONARY ANGIOGRAM;  Surgeon: Blane Ohara, MD;  Location: [facility] CATH LAB;  Service: Cardiovascular;  Laterality: N/A;   PERCUTANEOUS CORONARY STENT INTERVENTION (PCI-S) N/A 05/03/2014   Procedure: PERCUTANEOUS CORONARY STENT INTERVENTION (PCI-S);  Surgeon: Laverda Page, MD;  Location: [facility] CATH LAB;  Service: Cardiovascular;  Laterality: N/A;   ROTATOR CUFF REPAIR     UMBILICAL HERNIA REPAIR  yrs ago    Family History  Adopted: Yes  Problem Relation Age of Onset   Emphysema Mother     Social History   Tobacco Use   Smoking status: Never   Smokeless tobacco: Never  Vaping Use    Vaping Use: Never used  Substance Use Topics   Alcohol use: No   Drug use: No    Prior to Admission medications   Medication Sig Start Date End Date Taking? Authorizing Provider  acetaminophen (TYLENOL) 500 MG tablet Take 500 mg by mouth every 6 (six) hours as needed for mild pain, moderate pain, headache or fever.   Yes [provider]  albuterol (PROVENTIL HFA;VENTOLIN HFA) 108 (90 Base) MCG/ACT inhaler Inhale 2 puffs into the lungs every 6 (six) hours as needed for wheezing or shortness of breath. 06/08/18  Yes Reed, Tiffany L, DO  Alirocumab (PRALUENT) 150 MG/ML SOAJ Inject 1 pen into the skin every 14 (fourteen) days. 12/01/19  Yes Adrian Prows, MD  Cholecalciferol (D-5000) 125 MCG (5000 UT) TABS Take 1 tablet (5,000 Units total) by mouth daily. E55.9 Patient taking differently: Take 1 tablet by mouth daily. 01/18/21  Yes Lauree Chandler, NP  clopidogrel (PLAVIX) 75 MG tablet Take 1 tablet (75 mg total) by mouth daily. 01/16/21 02/15/21 Yes Swayze, Ava, DO  dapagliflozin propanediol (FARXIGA) 10 MG TABS tablet Take 1 tablet (10 mg total) by mouth daily before breakfast. 12/25/20  Yes Renato Shin, MD  famotidine (PEPCID) 20 MG tablet TAKE 1 TABLET BY MOUTH EVERY DAY Patient taking differently: Take 20 mg by mouth daily. 07/06/19  Yes Reed, Tiffany L, DO  fluticasone (FLONASE) 50 MCG/ACT nasal spray Place 1 spray into both nostrils daily as needed for allergies or rhinitis.   Yes [provider]  gabapentin (NEURONTIN) 100 MG capsule Take 2 capsules (200 mg total) by mouth at bedtime. 09/05/20  Yes Lauree Chandler, NP  glucose 4 GM chewable tablet Chew 1 tablet (4 g total) by mouth as needed for low blood sugar. 07/28/20  Yes Fargo, Amy E, NP  insulin aspart (NOVOLOG FLEXPEN) 100 UNIT/ML FlexPen Inject 15-20 Units into the skin 3 (three) times daily with meals. Patient taking differently: Inject 20 Units into the skin 3 (three) times daily with meals. 09/14/20  Yes Renato Shin, MD  Insulin Glargine [facility]) 100 UNIT/ML Inject 40 units subcutaneously into the skin at bedtime Patient taking differently: 40 Units at bedtime. 07/28/20  Yes Lauree Chandler, NP  metoprolol succinate (TOPROL-XL)  25 MG 24 hr tablet Take 0.5 tablets (12.5 mg total) by mouth daily. Take with or immediately following a meal. Patient taking differently: Take 25 mg by mouth daily. 01/15/21 05/15/21 Yes Swayze, Ava, DO  nitroGLYCERIN (NITROSTAT) 0.4 MG SL tablet Place 1 tablet (0.4 mg total) under the tongue every 5 (five) minutes as needed for chest pain. 10/04/20  Yes Lauree Chandler, NP  pantoprazole (PROTONIX) 40 MG tablet Take one tablet by mouth once daily for stomach Patient taking differently: Take 40 mg by mouth daily. 09/25/20  Yes Lauree Chandler, NP  pramipexole (MIRAPEX) 1 MG tablet TAKE 1 TABLET BY MOUTH AT 5PM, 1 TABLET BY MOUTH AT 7PM, AND TAKE 2 TABLETS BY MOUTH AT BEDTIME. Patient taking differently: Take 1-2 mg by mouth See admin instructions. Take 20m at 5pm, 194mat 7pm, and 71m60mt bedtime. 11/23/20  Yes EubLauree ChandlerP  valsartan (DIOVAN) 160 MG tablet Take 1 tablet (160 mg total) by mouth every evening. 06/02/20  Yes GanAdrian ProwsD  BD PEN NEEDLE NANO 2ND GEN 32G X 4 MM MISC USE AS DIRECTED 04/12/20   Reed, Tiffany L, DO  Continuous Blood Gluc Receiver (FREESTYLE LIBRE 14 DAY READER) DEVI Inject 1 Device as directed daily as needed. Use once daily as direct to check blood sugar E11.51 05/03/19   Reed, Tiffany L, DO  Continuous Blood Gluc Sensor (FREESTYLE LIBRE 14 DAY SENSOR) MISC Use to test blood sugar three times daily. E11.51. E11.59 08/29/20   EubLauree ChandlerP  LIFLong[facility] Surgical Center LLCNEPOINT LANCETS MISC Use to test for blood sugar three times daily dx: E11.51 06/01/18   EubLauree ChandlerP  ONEMarian Medical CenterTRA test strip USE TO TEST FOR BLOOD SUGAR THREE TIMES DAILY DX: E11.51 12/04/18   ReeHollace Kinnier DO    Current Facility-Administered Medications  Medication  Dose Route Frequency Provider Last Rate Last Admin   acetaminophen (TYLENOL) tablet 650 mg  650 mg Oral Q6H PRN YatKarmen BongoD       Or   acetaminophen (TYLENOL) suppository 650 mg  650 mg Rectal Q6H PRN YatKarmen BongoD       albuterol (VENTOLIN HFA) 108 (90 Base) MCG/ACT inhaler 2 puff  2 puff Inhalation Q6H PRN YatKarmen BongoD       Basaglar KwikPen KwikPen 40 Units  40 Units Subcutaneous Q2200 YatKarmen BongoD       bisacodyl (DULCOLAX) EC tablet 5 mg  5 mg Oral Daily PRN YatKarmen BongoD       clopidogrel (PLAVIX) tablet 75 mg  75 mg Oral Daily YatKarmen BongoD       docusate sodium (COLACE) capsule 100 mg  100 mg Oral BID YatKarmen BongoD       gabapentin (NEURONTIN) capsule 200 mg  200 mg Oral QHS YatKarmen BongoD       hydrALAZINE (APRESOLINE) injection 5 mg  5 mg Intravenous Q4H PRN YatKarmen BongoD       insulin aspart (novoLOG) injection 0-15 Units  0-15 Units Subcutaneous TID WC YatKarmen BongoD       insulin aspart (novoLOG) injection 0-5 Units  0-5 Units Subcutaneous QHS YatKarmen BongoD       lactated ringers infusion   Intravenous Continuous YatKarmen BongoD       LORazepam (ATIVAN) injection 1 mg  1 mg Intravenous Once YatKarmen BongoD       metoprolol succinate (TOPROL-XL) 24 hr tablet 25 mg  25  mg Oral Daily Karmen Bongo, MD       morphine 2 MG/ML injection 2 mg  2 mg Intravenous Q2H PRN Karmen Bongo, MD       ondansetron [facility]) tablet 4 mg  4 mg Oral Q6H PRN Karmen Bongo, MD       Or   ondansetron [facility]) injection 4 mg  4 mg Intravenous Q6H PRN Karmen Bongo, MD       oxyCODONE (Oxy IR/ROXICODONE) immediate release tablet 5 mg  5 mg Oral Q4H PRN Karmen Bongo, MD       pantoprazole (PROTONIX) injection 40 mg  40 mg Intravenous Q24H Karmen Bongo, MD       polyethylene glycol (MIRALAX / GLYCOLAX) packet 17 g  17 g Oral Daily PRN Karmen Bongo, MD       pramipexole (MIRAPEX) tablet 1 mg  1 mg Oral Daily Karmen Bongo, MD       pramipexole (MIRAPEX) tablet 1 mg  1 mg Oral Daily Karmen Bongo, MD       pramipexole (MIRAPEX) tablet 2 mg  2 mg Oral Ivery Quale, MD        Allergies as of 02/06/2021 - Review Complete 02/06/2021  Allergen Reaction Noted   Lyrica [pregabalin] Other (See Comments) 09/18/2015   Statins Other (See Comments) 27/51/7001   Trulicity [dulaglutide] Other (See Comments) 03/02/2018     Review of Systems:    Constitutional: No weight loss, fever or chills Skin: No rash  Cardiovascular: +chest pain Respiratory: No SOB  Gastrointestinal: See HPI and otherwise negative Genitourinary: No dysuria  Neurological: No headache, dizziness or syncope Musculoskeletal: No new muscle or joint pain Hematologic: No bleeding  Psychiatric: No history of depression or anxiety    Physical Exam:  Vital signs in last 24 hours: Temp:  [98.3 F (36.8 C)-100.5 F (38.1 C)] 98.3 F (36.8 C) (12/07 0729) Pulse Rate:  [73-112] 78 (12/07 0729) Resp:  [14-22] 14 (12/07 0729) BP: (100-155)/(60-102) 100/67 (12/07 0729) SpO2:  [92 %-100 %] 98 % (12/07 0729) Last BM Date:  (PTA) General:   Pleasant Caucasian male appears to be in NAD, Well developed, Well nourished, alert and cooperative Head:  Normocephalic and atraumatic. Eyes:   PEERL, EOMI. No icterus. Conjunctiva pink. Ears:  Normal auditory acuity. Neck:  Supple Throat: Oral cavity and pharynx without inflammation, swelling or lesion. Lungs: Respirations even and unlabored. Lungs clear to auscultation bilaterally.   No wheezes, crackles, or rhonchi.  Heart: Normal S1, S2. No MRG. Regular rate and rhythm. No peripheral edema, cyanosis or pallor.  Abdomen:  Soft, nondistended, nontender. No rebound or guarding. Normal bowel sounds. No appreciable masses or hepatomegaly. Rectal:  Not performed.  Msk:  Symmetrical without gross deformities. Peripheral pulses intact.  Extremities:  Without edema, no deformity or joint abnormality.   Neurologic:  Alert and  oriented x4;  grossly normal neurologically.  Skin:   Dry and intact without significant lesions or rashes. Psychiatric: Demonstrates good judgement and reason without abnormal affect or behaviors.   LAB RESULTS: Recent Labs    02/06/21 1430 02/07/21 0826  WBC 9.7 9.2  HGB 14.9 14.3  HCT 44.1 42.3  PLT 112* 102*   BMET Recent Labs    02/06/21 1430 02/07/21 0826  NA 135 134*  K 4.7 5.0  CL 98 98  CO2 28 27  GLUCOSE 277* 315*  BUN 19 25*  CREATININE 1.48* 1.81*  CALCIUM 9.8 8.5*   Hepatic Function Latest Ref Rng & Units  02/07/2021 02/06/2021 01/14/2021  Total Protein 6.5 - 8.1 g/dL 6.4(L) 7.2 6.5  Albumin 3.5 - 5.0 g/dL 3.4(L) 4.6 3.8  AST 15 - 41 U/L 270(H) 474(H) 24  ALT 0 - 44 U/L 451(H) 356(H) 38  Alk Phosphatase 38 - 126 U/L 88 91 45  Total Bilirubin 0.3 - 1.2 mg/dL 5.7(H) 3.5(H) 0.6  Bilirubin, Direct 0.0 - 0.2 mg/dL - - -    STUDIES: CT ABDOMEN PELVIS W CONTRAST  Result Date: 02/06/2021 CLINICAL DATA:  Abdominal pain, fever.  History of hernia repair EXAM: CT ABDOMEN AND PELVIS WITH CONTRAST TECHNIQUE: Multidetector CT imaging of the abdomen and pelvis was performed using the standard protocol following bolus administration of intravenous contrast. CONTRAST:  141m OMNIPAQUE IOHEXOL 300 MG/ML  SOLN COMPARISON:  CT abdomen pelvis 04/20/2018 FINDINGS: Lower chest: No acute abnormality. Hepatobiliary: The hepatic parenchyma is diffusely mildly hypodense compared to the splenic parenchyma consistent with fatty infiltration. No focal liver abnormality. Gallstone noted within the gallbladder lumen. No gallbladder wall thickening or pericholecystic fluid. No biliary dilatation. Pancreas: No focal lesion. Normal pancreatic contour. No surrounding inflammatory changes. No main pancreatic ductal dilatation. Spleen: Normal in size without focal abnormality. Adrenals/Urinary Tract: No adrenal nodule bilaterally. Bilateral kidneys enhance symmetrically. There  is a 1.4 cm left renal lesion with a density of 25 Hounsfield units. No hydronephrosis. No hydroureter. The urinary bladder is unremarkable. On delayed imaging, there is no urothelial wall thickening and there are no filling defects in the opacified portions of the bilateral collecting systems or ureters. Stomach/Bowel: Stomach is within normal limits. No evidence of bowel wall thickening or dilatation. Appendix appears normal. Vascular/Lymphatic: No abdominal aorta or iliac aneurysm. Moderate to severe atherosclerotic plaque of the aorta and its branches. No abdominal, pelvic, or inguinal lymphadenopathy. Reproductive: Prostate is unremarkable. Other: No intraperitoneal free fluid. No intraperitoneal free gas. No organized fluid collection. Musculoskeletal: No abdominal wall hernia or abnormality. No suspicious lytic or blastic osseous lesions. No acute displaced fracture. Multilevel degenerative changes of the spine. IMPRESSION: 1. Cholelithiasis with no CT findings of acute cholecystitis. 2. Possible mild hepatic steatosis. 3. Indeterminate 1.4 cm left renal lesion. Recommend MRI renal protocol for further evaluation. 4.  Aortic Atherosclerosis (ICD10-I70.0). Electronically Signed   By: MIven FinnM.D.   On: 02/06/2021 19:40   UKoreaAbdomen Limited  Result Date: 02/06/2021 CLINICAL DATA:  Right upper quadrant pain EXAM: ULTRASOUND ABDOMEN LIMITED RIGHT UPPER QUADRANT COMPARISON:  CT 04/20/2018 FINDINGS: Gallbladder: No gallstones or wall thickening visualized. No sonographic Murphy sign noted by sonographer. Common bile duct: Diameter: 5.6 mm Liver: Diffusely echogenic. Suspicion of surface nodularity portal vein is patent on color Doppler imaging with normal direction of blood flow towards the liver. Other: None. IMPRESSION: 1. Negative for gallstones. 2. Echogenic liver consistent with hepatic steatosis and or hepatocellular disease. Possible subtle contour nodularity as may be seen with cirrhosis  Electronically Signed   By: KDonavan FoilM.D.   On: 02/06/2021 17:38      Impression / Plan:   Impression: 1.  Elevated LFTs: AST 474--> 270, ALT 356--> 451, total bili 3.5--> 5.7 (only comparison in 11/28/2015 with normal LFTs), patient denies previous history of being told this, right upper quadrant ultrasound with hepatic steatosis and possible cirrhosis, platelets are minimally decreased, no alcohol history, also with epigastric/atypical chest pain as below with nausea and vomiting acutely; consider possible choledocholithiasis versus liver disease i.e. hepatitis (autoimmune versus viral versus other) 2.  Epigastric pain/atypical chest pain: CT with cholelithiasis,  though right upper quadrant ultrasound did not show this, patient with some pain that radiated from his epigastrium up into his chest with exertion over the past 3 months, plans per cardiologist for stress test given his history of multiple stents in the past; consider cardiac origin versus gastric versus musculoskeletal 3.  History of CAD: Status post stents x4 on Plavix 4.  History of ulcer disease: Continues on Pantoprazole daily   Plan: 1.  We will discuss acute hepatitis panel testing as well as other liver serologies with Dr. Loletha Carrow, will need to add a PT/INR 2.  For now continue Plavix and Pantoprazole 3.  Agree with MRCP as ordered 4.  Continue n.p.o. until after time of MRI, then can have regular diet 5.  Would recommend consulting cardiology while patient is here given what appears to be exertional chest pain 6.  Continue to monitor LFTs daily 7.  At some point in the future patient may require EGD for further evaluation of this epigastric/chest pain but would recommend this take place after his ischemic work-up  Thank you for your kind consultation, we will continue to follow.  Lavone Nian [facility]  02/07/2021, 9:55 AM  I have reviewed the entire case in detail with the above APP and discussed the plan in detail.   Therefore, I agree with the diagnoses recorded above. In addition,  I have personally interviewed and examined the patient and have personally reviewed any abdominal/pelvic CT scan images.  My additional thoughts are as follows: (My addition to the exam findings noted above by the PA is that the patient is also jaundiced)  Recent exertional epigastric and substernal chest pain, he is quite clear that it always happens with lifting and other activities. Elevated LFTs that are rising since admission are certainly new from last month, suggesting possible hepatobiliary cause of this pain.  Hepatitis or autoimmune liver diseases would not be expected to cause this kind of pain, so we are concerned about possible choledocholithiasis.  However, things that are not typical for that in this situation are the exertional nature of the pain, normal alkaline phosphatase and lack of biliary ductal dilatation on imaging.  He has known multivessel coronary disease with prior interventions and his cardiologist was considering stress testing.  According to that consult note and discussion with the patient and his wife today, that has now been put on hold while working up the GI/liver issues.  Today's cardiology consult note indicates the patient could be taken for endoscopic procedures with low risk. If patient has CBD stone or other findings on MRI requiring ERCP, anesthesiology may still wish to speak with the cardiologist regarding preprocedure risk assessment.  [name] was unable to tolerate MRI with Ativan earlier, so the plans are currently for him to have it under anesthesia.  He had a reported fever to 105 degrees in the ED, and seems to been the reason he was put on ceftriaxone.  However, I have to wonder if that was entirely accurate as he has persistently normal WBC and no recurrence of fever. Of note, admission ultrasound suggested possible nodular contour of liver edge, however CT scan showed a fatty  appearing liver but without morphologic evidence of cirrhosis.  There is no evidence of portal hypertension either, thus there would seem to be some other explanation for patient's chronic thrombocytopenia.  We will follow closely. Nelida Meuse III Office:[phone]

## 2021-02-07 NOTE — Consult Note (Signed)
CARDIOLOGY CONSULT NOTE  Patient ID: Kenneth Sic Sr. MRN: [id number] DOB/AGE: 09-12-1950 70 y.o.  Admit date: 02/06/2021 Referring Physician  Jonah Blue, MD Primary Physician:  [name], NP Reason for Consultation  Chest pain and abdominal pain  Patient ID: Kenneth Grafton Sr., male    DOB: 1950/09/11, 70 y.o.   MRN: [id number]  Chief Complaint  Patient presents with   Abdominal Pain   HPI:    Kenneth Vasco Sr.  is a 70 y.o. caucasian male with coronary artery disease, last cardiac catheterization on 05/29/2017 when he presented with unstable anginal-like symptoms revealing widely patent stent to his LAD, RCA and circumflex and also balloon angioplasty site to D1 and distal LAD. Fortunately his last PCI has been in 2016.  Past medical history significant for hypertension, hyperlipidemia, GERD, Barrett's esophagus, statin intolerance, uncontrolled diabetes mellitus and also severe restless leg syndrome.   Patient was seen in the emergency room on 02/01/2021 with chest pain suggestive angina pectoris, upper abdominal discomfort, ruled out for myocardial infarction discharged home.  I had seen him just 2 days ago and felt his pain could be related to underlying CAD versus microvascular versus microvascular disease.  He is again readmitted to the hospital yesterday on 02/06/2021 with chest pain, abdominal discomfort.  This was associated with fever, nausea.  Patient felt his symptoms were very similar to the symptoms that he had on 02/01/2021.  His wife is present at the bedside.  Presently he is sedated and states that he has not had any further abdominal pain or chest discomfort.  Denies dyspnea, no bloody stools, no dark stools.  Past Medical History:  Diagnosis Date   Arthritis    "left thumb" (05/23/2017) right shoulder   Barrett's esophagus    "we've been told that it's all gone; still take RX for GERD" (05/23/2017)   Bilateral swelling of feet and ankles x 3 weeks as of 12-01-2019   CAD  (coronary artery disease), native coronary artery    Coronary angiogram 05/03/2014:  Proximal LAD 3.0x12 mm Promus premier stent.  04/08/2014: Mid Cx 3.5 x 16 mm Promus DES.  03/29/2013: Mid LAD 2.75 x 38 mm Promus Premier drug-eluting stent, balloon angioplasty of D1 and distal LAD and stenting of distal RCA with 2.75 x 24 mm promos Premier drug-eluting stent 12/15/2012 widely patent.   Cervicalgia    Chronic bronchitis (HCC)    "q yr" (05/23/2017)   Complication of anesthesia    " I do not wake up very well "   COVID-19 05/2019   all covid symptoms in hospital x 5 days, all symptoms resolved took monoclonal antibody tx    Family history of adverse reaction to anesthesia    "son w/PONV"   GERD (gastroesophageal reflux disease)    Heart murmur    "grew out of it"    Hyperlipidemia    Hypoglycemia, unspecified    IDDM (insulin dependent diabetes mellitus)    type 2   Impotence of organic origin    Iron deficiency anemia    Other malaise and fatigue    Pneumonia 05/2019   PONV (postoperative nausea and vomiting)    after wisdom teeth pulled   Restless leg    Rotator cuff arthropathy    Tension headache    "sometimes" (05/23/2017)   Unspecified gastritis and gastroduodenitis with hemorrhage    Unstable angina pectoris (HCC) 04/08/2014   Coronary angiogram 05/03/2014:  Proximal LAD 3.0x12 mm Promus premier stent.  04/08/2014: Mid Cx 3.5  x 16 mm Promus DES.  03/29/2013: Mid LAD 2.75 x 38 mm Promus Premier drug-eluting stent, balloon angioplasty of D1 and distal LAD and stenting of distal RCA with 2.75 x 24 mm promos Premier drug-eluting stent 12/15/2012 widely patent.   Past Surgical History:  Procedure Laterality Date   BICEPT TENODESIS Right 12/07/2019   Procedure: RIGHT BICEPS TENODESIS;  Surgeon: Renette Butters, MD;  Location: [facility];  Service: Orthopedics;  Laterality: Right;   CARDIAC CATHETERIZATION N/A 09/25/2015   Procedure: Left Heart Cath and Coronary Angiography;   Surgeon: Adrian Prows, MD;  Location: [place] [facility];  Service: Cardiovascular;  Laterality: N/A;   CARDIAC CATHETERIZATION N/A 09/25/2015   Procedure: Intravascular Pressure Wire/FFR Study;  Surgeon: Adrian Prows, MD;  Location: [place] [facility];  Service: Cardiovascular;  Laterality: N/A;   CORONARY ANGIOPLASTY WITH STENT PLACEMENT  12/15/2012; 04/28/2014; 05/03/2014   "2; 1; 1" , 4 stents 2 balloons   FRACTIONAL FLOW RESERVE WIRE Right 03/26/2013   Procedure: FRACTIONAL FLOW RESERVE WIRE;  Surgeon: Laverda Page, MD;  Location: [facility] CATH LAB;  Service: Cardiovascular;  Laterality: Right;   FRACTIONAL FLOW RESERVE WIRE N/A 05/03/2014   Procedure: FRACTIONAL FLOW RESERVE WIRE;  Surgeon: Laverda Page, MD;  Location: [facility] CATH LAB;  Service: Cardiovascular;  Laterality: N/A;   HERNIA REPAIR  [id number]   "umbilical"    INCISION AND DRAINAGE ABSCESS Left 05/2007   "groin"    KNEE SURGERY Right 1992;  2000   "calcium deposits removed" (12/15/2012)   LEFT HEART CATH AND CORONARY ANGIOGRAPHY N/A 05/23/2017   Procedure: LEFT HEART CATH AND CORONARY ANGIOGRAPHY;  Surgeon: Adrian Prows, MD;  Location: [place] [facility];  Service: Cardiovascular;  Laterality: N/A;   LEFT HEART CATHETERIZATION WITH CORONARY ANGIOGRAM N/A 12/15/2012   Procedure: LEFT HEART CATHETERIZATION WITH CORONARY ANGIOGRAM;  Surgeon: Laverda Page, MD;  Location: [facility] CATH LAB;  Service: Cardiovascular;  Laterality: N/A;   LEFT HEART CATHETERIZATION WITH CORONARY ANGIOGRAM N/A 03/26/2013   Procedure: LEFT HEART CATHETERIZATION WITH CORONARY ANGIOGRAM;  Surgeon: Laverda Page, MD;  Location: [facility] CATH LAB;  Service: Cardiovascular;  Laterality: N/A;   LEFT HEART CATHETERIZATION WITH CORONARY ANGIOGRAM N/A 04/08/2014   Procedure: LEFT HEART CATHETERIZATION WITH CORONARY ANGIOGRAM;  Surgeon: Blane Ohara, MD;  Location: [facility] CATH LAB;  Service: Cardiovascular;  Laterality: N/A;   PERCUTANEOUS CORONARY STENT INTERVENTION (PCI-S) N/A 05/03/2014    Procedure: PERCUTANEOUS CORONARY STENT INTERVENTION (PCI-S);  Surgeon: Laverda Page, MD;  Location: [facility] CATH LAB;  Service: Cardiovascular;  Laterality: N/A;   ROTATOR CUFF REPAIR     UMBILICAL HERNIA REPAIR  yrs ago   Social History   Tobacco Use   Smoking status: Never   Smokeless tobacco: Never  Substance Use Topics   Alcohol use: No    Family History  Adopted: Yes  Problem Relation Age of Onset   Emphysema Mother     Marital Status: Married  ROS  Review of Systems  Cardiovascular:  Positive for chest pain. Negative for dyspnea on exertion and leg swelling.  Respiratory: Negative.    Musculoskeletal:  Positive for muscle cramps (restless leg syndrome).  Gastrointestinal:  Positive for bloating and abdominal pain. Negative for hematochezia and melena.  All other systems reviewed and are negative. Objective   Vitals with BMI 02/07/2021 02/07/2021 02/07/2021  Height - - -  Weight - - -  BMI - - -  Systolic [id number]  Diastolic 70  67 70  Pulse - 78 81    Blood pressure 130/70, pulse 78, temperature 98.2 F (36.8 C), temperature source Oral, resp. rate 20, SpO2 98 %.    Physical Exam Constitutional:      General: He is not in acute distress.    Appearance: He is obese.  Neck:     Vascular: No carotid bruit or JVD.  Cardiovascular:     Rate and Rhythm: Normal rate and regular rhythm.     Pulses: Intact distal pulses.     Heart sounds: Normal heart sounds. No murmur heard.   No gallop.  Pulmonary:     Effort: Pulmonary effort is normal.     Breath sounds: Normal breath sounds.  Abdominal:     General: Abdomen is protuberant. Bowel sounds are normal.     Palpations: Abdomen is soft.     Tenderness: There is abdominal tenderness in the epigastric area and periumbilical area. There is no guarding. Negative signs include Murphy's sign.  Musculoskeletal:     Right lower leg: No edema.     Left lower leg: No edema.  Skin:    General: Skin is warm.      Capillary Refill: Capillary refill takes less than 2 seconds.  Neurological:     General: No focal deficit present.     Mental Status: He is oriented to person, place, and time.  Psychiatric:        Mood and Affect: Mood normal.   Laboratory examination:   Recent Labs    02/22/20 0805 06/30/20 0808 01/03/21 0825 02/01/21 1833 02/06/21 1430 02/07/21 0826  NA 135 137   < > 135 135 134*  K 4.8 5.2   < > 4.8 4.7 5.0  CL 101 102   < > 99 98 98  CO2 28 30   < > 28 28 27   GLUCOSE 208* 152*   < > 295* 277* 315*  BUN 19 25   < > 22 19 25*  CREATININE 1.07 1.25*   < > 1.21 1.48* 1.81*  CALCIUM 9.4 9.7   < > 10.2 9.8 8.5*  GFRNONAA 70 58*   < > >60 50* 39*  GFRAA 81 67  --   --   --   --    < > = values in this interval not displayed.   estimated creatinine clearance is 41 mL/min (A) (by C-G formula based on SCr of 1.81 mg/dL (H)).  CMP Latest Ref Rng & Units 02/07/2021 02/06/2021 02/01/2021  Glucose 70 - 99 mg/dL 315(H) 277(H) 295(H)  BUN 8 - 23 mg/dL 25(H) 19 22  Creatinine 0.61 - 1.24 mg/dL 1.81(H) 1.48(H) 1.21  Sodium 135 - 145 mmol/L 134(L) 135 135  Potassium 3.5 - 5.1 mmol/L 5.0 4.7 4.8  Chloride 98 - 111 mmol/L 98 98 99  CO2 22 - 32 mmol/L 27 28 28   Calcium 8.9 - 10.3 mg/dL 8.5(L) 9.8 10.2  Total Protein 6.5 - 8.1 g/dL 6.4(L) 7.2 -  Total Bilirubin 0.3 - 1.2 mg/dL 5.7(H) 3.5(H) -  Alkaline Phos 38 - 126 U/L 88 91 -  AST 15 - 41 U/L 270(H) 474(H) -  ALT 0 - 44 U/L 451(H) 356(H) -   CBC Latest Ref Rng & Units 02/07/2021 02/06/2021 02/01/2021  WBC 4.0 - 10.5 K/uL 9.2 9.7 8.2  Hemoglobin 13.0 - 17.0 g/dL 14.3 14.9 15.2  Hematocrit 39.0 - 52.0 % 42.3 44.1 44.6  Platelets 150 - 400 K/uL 102(L) 112(L) 137(L)  Lipid Panel Recent Labs    06/30/20 0808 01/15/21 0811  CHOL 104 81  TRIG 173* 174*  LDLCALC 37 15  VLDL  --  35  HDL 42 31*  CHOLHDL 2.5 2.6    HEMOGLOBIN A1C Lab Results  Component Value Date   HGBA1C 7.9 (H) 01/15/2021   MPG 180.03 01/15/2021   TSH Recent  Labs    01/15/21 0811  TSH 2.684   BNP (last 3 results) Recent Labs    01/03/21 0825  BNP 16.8   Cardiac Panel (last 3 results) Recent Labs    02/06/21 1430 02/06/21 1648  TROPONINIHS 14 13     Medications and allergies   Allergies  Allergen Reactions   Lyrica [Pregabalin] Other (See Comments)    Made patient very lethargic the next morning, very hard to patient to function, move, etc.    Statins Other (See Comments)    Causes Restless Legs, rhabdomyolysis   Trulicity [Dulaglutide] Other (See Comments)    GI side effects      Current Meds  Medication Sig   acetaminophen (TYLENOL) 500 MG tablet Take 500 mg by mouth every 6 (six) hours as needed for mild pain, moderate pain, headache or fever.   albuterol (PROVENTIL HFA;VENTOLIN HFA) 108 (90 Base) MCG/ACT inhaler Inhale 2 puffs into the lungs every 6 (six) hours as needed for wheezing or shortness of breath.   Alirocumab (PRALUENT) 150 MG/ML SOAJ Inject 1 pen into the skin every 14 (fourteen) days.   Cholecalciferol (D-5000) 125 MCG (5000 UT) TABS Take 1 tablet (5,000 Units total) by mouth daily. E55.9 (Patient taking differently: Take 1 tablet by mouth daily.)   clopidogrel (PLAVIX) 75 MG tablet Take 1 tablet (75 mg total) by mouth daily.   dapagliflozin propanediol (FARXIGA) 10 MG TABS tablet Take 1 tablet (10 mg total) by mouth daily before breakfast.   famotidine (PEPCID) 20 MG tablet TAKE 1 TABLET BY MOUTH EVERY DAY (Patient taking differently: Take 20 mg by mouth daily.)   fluticasone (FLONASE) 50 MCG/ACT nasal spray Place 1 spray into both nostrils daily as needed for allergies or rhinitis.   gabapentin (NEURONTIN) 100 MG capsule Take 2 capsules (200 mg total) by mouth at bedtime.   glucose 4 GM chewable tablet Chew 1 tablet (4 g total) by mouth as needed for low blood sugar.   insulin aspart (NOVOLOG FLEXPEN) 100 UNIT/ML FlexPen Inject 15-20 Units into the skin 3 (three) times daily with meals. (Patient taking  differently: Inject 20 Units into the skin 3 (three) times daily with meals.)   Insulin Glargine (BASAGLAR KWIKPEN) 100 UNIT/ML Inject 40 units subcutaneously into the skin at bedtime (Patient taking differently: 40 Units at bedtime.)   metoprolol succinate (TOPROL-XL) 25 MG 24 hr tablet Take 0.5 tablets (12.5 mg total) by mouth daily. Take with or immediately following a meal. (Patient taking differently: Take 25 mg by mouth daily.)   nitroGLYCERIN (NITROSTAT) 0.4 MG SL tablet Place 1 tablet (0.4 mg total) under the tongue every 5 (five) minutes as needed for chest pain.   pantoprazole (PROTONIX) 40 MG tablet Take one tablet by mouth once daily for stomach (Patient taking differently: Take 40 mg by mouth daily.)   pramipexole (MIRAPEX) 1 MG tablet TAKE 1 TABLET BY MOUTH AT 5PM, 1 TABLET BY MOUTH AT 7PM, AND TAKE 2 TABLETS BY MOUTH AT BEDTIME. (Patient taking differently: Take 1-2 mg by mouth See admin instructions. Take 1mg  at 5pm, 1mg  at 7pm, and 2mg  at bedtime.)  valsartan (DIOVAN) 160 MG tablet Take 1 tablet (160 mg total) by mouth every evening.    Scheduled Meds:  clopidogrel  75 mg Oral Daily   docusate sodium  100 mg Oral BID   gabapentin  200 mg Oral QHS   insulin aspart  0-15 Units Subcutaneous TID WC   insulin aspart  0-5 Units Subcutaneous QHS   insulin glargine-yfgn  40 Units Subcutaneous Daily   metoprolol succinate  25 mg Oral Daily   pantoprazole (PROTONIX) IV  40 mg Intravenous Q24H   pramipexole  1 mg Oral Daily   pramipexole  1 mg Oral Daily   pramipexole  2 mg Oral QHS   Continuous Infusions:  lactated ringers 100 mL/hr at 02/07/21 1221   PRN Meds:.acetaminophen **OR** acetaminophen, albuterol, bisacodyl, hydrALAZINE, morphine injection, ondansetron **OR** ondansetron (ZOFRAN) IV, oxyCODONE, polyethylene glycol   I/O last 3 completed shifts: In: 500 [IV Piggyback:500] Out: 400 [Urine:400] No intake/output data recorded.    Radiology:   CT ABDOMEN PELVIS W  CONTRAST  Result Date: 02/06/2021 CLINICAL DATA:  Abdominal pain, fever.  History of hernia repair EXAM: CT ABDOMEN AND PELVIS WITH CONTRAST TECHNIQUE: Multidetector CT imaging of the abdomen and pelvis was performed using the standard protocol following bolus administration of intravenous contrast. CONTRAST:  126mL OMNIPAQUE IOHEXOL 300 MG/ML  SOLN COMPARISON:  CT abdomen pelvis 04/20/2018 FINDINGS: Lower chest: No acute abnormality. Hepatobiliary: The hepatic parenchyma is diffusely mildly hypodense compared to the splenic parenchyma consistent with fatty infiltration. No focal liver abnormality. Gallstone noted within the gallbladder lumen. No gallbladder wall thickening or pericholecystic fluid. No biliary dilatation. Pancreas: No focal lesion. Normal pancreatic contour. No surrounding inflammatory changes. No main pancreatic ductal dilatation. Spleen: Normal in size without focal abnormality. Adrenals/Urinary Tract: No adrenal nodule bilaterally. Bilateral kidneys enhance symmetrically. There is a 1.4 cm left renal lesion with a density of 25 Hounsfield units. No hydronephrosis. No hydroureter. The urinary bladder is unremarkable. On delayed imaging, there is no urothelial wall thickening and there are no filling defects in the opacified portions of the bilateral collecting systems or ureters. Stomach/Bowel: Stomach is within normal limits. No evidence of bowel wall thickening or dilatation. Appendix appears normal. Vascular/Lymphatic: No abdominal aorta or iliac aneurysm. Moderate to severe atherosclerotic plaque of the aorta and its branches. No abdominal, pelvic, or inguinal lymphadenopathy. Reproductive: Prostate is unremarkable. Other: No intraperitoneal free fluid. No intraperitoneal free gas. No organized fluid collection. Musculoskeletal: No abdominal wall hernia or abnormality. No suspicious lytic or blastic osseous lesions. No acute displaced fracture. Multilevel degenerative changes of the spine.  IMPRESSION: 1. Cholelithiasis with no CT findings of acute cholecystitis. 2. Possible mild hepatic steatosis. 3. Indeterminate 1.4 cm left renal lesion. Recommend MRI renal protocol for further evaluation. 4.  Aortic Atherosclerosis (ICD10-I70.0). Electronically Signed   By: Iven Finn M.D.   On: 02/06/2021 19:40   US Abdomen Limited  Result Date: 02/06/2021 CLINICAL DATA:  Right upper quadrant pain EXAM: ULTRASOUND ABDOMEN LIMITED RIGHT UPPER QUADRANT COMPARISON:  CT 04/20/2018 FINDINGS: Gallbladder: No gallstones or wall thickening visualized. No sonographic Murphy sign noted by sonographer. Common bile duct: Diameter: 5.6 mm Liver: Diffusely echogenic. Suspicion of surface nodularity portal vein is patent on color Doppler imaging with normal direction of blood flow towards the liver. Other: None. IMPRESSION: 1. Negative for gallstones. 2. Echogenic liver consistent with hepatic steatosis and or hepatocellular disease. Possible subtle contour nodularity as may be seen with cirrhosis Electronically Signed   By: Madie Reno.D.  On: 02/06/2021 17:38    Cardiac Studies:   Coronary angiogram 09/25/2015: Ostial Cx 50-60%, FFR 0.86. LVEF 60%. Patent stent placed 05/03/2014: Proximal LAD 3.0x12 mm Promus DES. 04/08/2014: Cx/OM1 with 3.5 x 16 mm Promus DES. H/O Mid LAD 2.75 x 38 mm Promus Premier DES, balloon angioplasty of D1 and distal LAD(03/29/2013) and Distal RCA stent (12/15/2012) with 2.75 x 24 mm promos Premier DES.   Lower extremity venous duplex for insufficiency 05/29/2017: Bilateral lower extremities negative for DVT or reflux.   Coronary angiogram 05/30/2019 and 05/23/2017: All stents placed previously are patent.  Proximal to mid LAD long stent (Overlaps prior stent) that was placed on 04/08/2014, circumflex/obtuse marginal-1 stenting. History of mid LAD stenting and balloon angioplasty to D1 and distal LAD performed on 03/29/2013, distal RCA stent in 2014. No change in ostial circumflex  lesion, the mid LAD stent is widely patent however at the outflow, there is a 30-40% stenosis.  Lexiscan Tetrofosmin stress test 05/31/2019: No previous exam available for comparison. Lexiscan nuclear stress test performed using 1-day protocol. Stress EKG is non-diagnostic, as this is pharmacological stress test. In addition, stress EKG at 85% MPHR shows sinus tachycardia, no ischemic changes.  Myocardial perfusion imaging is normal. Left ventricular ejection fraction is  71% with normal wall motion.  Low risk study.   Lower Extremity Arterial Duplex 06/22/2020: No hemodynamically significant stenoses are identified in the right or left lower extremity arterial system. Mildly  abnormal biphasic waveform noted throughout the lower extremity. The right and left mid posterior tibial and mid anterior tibial arteries are non-compressible due to medial arteriosclerosis.   PCV ECHOCARDIOGRAM COMPLETE 01/04/2021   Narrative Echocardiogram 01/04/2021: Normal LV systolic function with visual EF 60-65%. Left ventricle cavity is normal in size. Mild left ventricular hypertrophy. Normal global wall motion. Normal diastolic filling pattern, normal LAP. Mild (Grade I) aortic regurgitation. Mild tricuspid regurgitation. No evidence of pulmonary hypertension. Compared to study 06/22/2020 MR is now resolved otherwise no significant change.     EKG:  EKG 02/06/2021: Normal sinus rhythm, left axis deviation, poor R wave progression.  No evidence of ischemia.  No significant change from 02/01/2021  Assessment   Chest pain, most probably pleuritic type chest pain and noncardiac pain. 2.  Abdominal discomfort with markedly abnormal liver enzymes, may be related to acute cholecystitis versus pancreatitis, GI work-up is still pending. 3.  Severe restless leg syndrome 4.  CAD of the native vessel with stable angina pectoris 5.  Brittle diabetes mellitus  Recommendations:   Kenneth Leer Sr.  is a 70 y.o.  caucasian male with coronary artery disease, last cardiac catheterization on 05/29/2017 when he presented with unstable anginal-like symptoms revealing widely patent stent to his LAD, RCA and circumflex and also balloon angioplasty site to D1 and distal LAD. Fortunately his last PCI has been in 2016.  Past medical history significant for hypertension, hyperlipidemia, GERD, Barrett's esophagus, statin intolerance, uncontrolled diabetes mellitus and also severe restless leg syndrome.   Patient was seen in the emergency room on 02/01/2021 with chest pain and also abdominal discomfort, again presents with similar symptoms on 02/06/2021.  Continues pain, is cardiac markers are completely normal, EKG is nonischemic.  Beginning to wonder that his chest pain and also epigastric pain is probably from the GI etiology including cholecystitis versus pancreatitis as he does have brittle diabetes mellitus.  As my suspicion for ACS and significant progression of CAD is low, if he needs any GI procedures, he can be taken up for  ERCP/any other work-up with low risk.  I will be happy to be available to assist from cardiac standpoint.    Adrian Prows, MD, [facility] 02/07/2021, 2:10 PM Office: [phone]

## 2021-02-07 NOTE — Plan of Care (Signed)

## 2021-02-08 ENCOUNTER — Inpatient Hospital Stay (HOSPITAL_COMMUNITY): Payer: Medicare Other

## 2021-02-08 ENCOUNTER — Encounter (HOSPITAL_COMMUNITY)
Admission: EM | Disposition: A | Discharge status: Home / Self Care | Payer: Self-pay | Source: Home / Self Care | Attending: Family Medicine

## 2021-02-08 ENCOUNTER — Inpatient Hospital Stay (HOSPITAL_COMMUNITY): Payer: Medicare Other | Admitting: Anesthesiology

## 2021-02-08 ENCOUNTER — Encounter (HOSPITAL_COMMUNITY): Payer: Self-pay | Admitting: Family Medicine

## 2021-02-08 ENCOUNTER — Other Ambulatory Visit: Payer: Self-pay | Admitting: *Deleted

## 2021-02-08 DIAGNOSIS — Z794 Long term (current) use of insulin: Secondary | ICD-10-CM

## 2021-02-08 DIAGNOSIS — I25118 Atherosclerotic heart disease of native coronary artery with other forms of angina pectoris: Secondary | ICD-10-CM

## 2021-02-08 DIAGNOSIS — N179 Acute kidney failure, unspecified: Secondary | ICD-10-CM

## 2021-02-08 DIAGNOSIS — E119 Type 2 diabetes mellitus without complications: Secondary | ICD-10-CM

## 2021-02-08 HISTORY — PX: RADIOLOGY WITH ANESTHESIA: SHX6223

## 2021-02-08 LAB — CBC
HCT: 37.8 % — ABNORMAL LOW (ref 39.0–52.0)
Hemoglobin: 12.5 g/dL — ABNORMAL LOW (ref 13.0–17.0)
MCH: 29.9 pg (ref 26.0–34.0)
MCHC: 33.1 g/dL (ref 30.0–36.0)
MCV: 90.4 fL (ref 80.0–100.0)
Platelets: 89 10*3/uL — ABNORMAL LOW (ref 150–400)
RBC: 4.18 MIL/uL — ABNORMAL LOW (ref 4.22–5.81)
RDW: 13.3 % (ref 11.5–15.5)
WBC: 5.5 10*3/uL (ref 4.0–10.5)
nRBC: 0 % (ref 0.0–0.2)

## 2021-02-08 LAB — HEPATITIS C ANTIBODY: HCV Ab: NONREACTIVE

## 2021-02-08 LAB — COMPREHENSIVE METABOLIC PANEL
ALT: 263 U/L — ABNORMAL HIGH (ref 0–44)
AST: 125 U/L — ABNORMAL HIGH (ref 15–41)
Albumin: 2.9 g/dL — ABNORMAL LOW (ref 3.5–5.0)
Alkaline Phosphatase: 69 U/L (ref 38–126)
Anion gap: 8 (ref 5–15)
BUN: 24 mg/dL — ABNORMAL HIGH (ref 8–23)
CO2: 21 mmol/L — ABNORMAL LOW (ref 22–32)
Calcium: 8.4 mg/dL — ABNORMAL LOW (ref 8.9–10.3)
Chloride: 101 mmol/L (ref 98–111)
Creatinine, Ser: 1.5 mg/dL — ABNORMAL HIGH (ref 0.61–1.24)
GFR, Estimated: 49 mL/min — ABNORMAL LOW (ref >60)
Glucose, Bld: 122 mg/dL — ABNORMAL HIGH (ref 70–99)
Potassium: 4 mmol/L (ref 3.5–5.1)
Sodium: 130 mmol/L — ABNORMAL LOW (ref 135–145)
Total Bilirubin: 3.6 mg/dL — ABNORMAL HIGH (ref 0.3–1.2)
Total Protein: 5.6 g/dL — ABNORMAL LOW (ref 6.5–8.1)

## 2021-02-08 LAB — GLUCOSE, CAPILLARY
Glucose-Capillary: 120 mg/dL — ABNORMAL HIGH (ref 70–99)
Glucose-Capillary: 148 mg/dL — ABNORMAL HIGH (ref 70–99)
Glucose-Capillary: 119 mg/dL — ABNORMAL HIGH (ref 70–99)
Glucose-Capillary: 110 mg/dL — ABNORMAL HIGH (ref 70–99)
Glucose-Capillary: 118 mg/dL — ABNORMAL HIGH (ref 70–99)
Glucose-Capillary: 399 mg/dL — ABNORMAL HIGH (ref 70–99)

## 2021-02-08 LAB — PROTIME-INR
INR: 1.2 (ref 0.8–1.2)
Prothrombin Time: 14.9 seconds (ref 11.4–15.2)

## 2021-02-08 LAB — HEPATITIS A ANTIBODY, TOTAL: hep A Total Ab: NONREACTIVE

## 2021-02-08 LAB — HEPATITIS B SURFACE ANTIGEN: Hepatitis B Surface Ag: NONREACTIVE

## 2021-02-08 LAB — HEPATITIS B CORE ANTIBODY, IGM: Hep B C IgM: NONREACTIVE

## 2021-02-08 SURGERY — MRI WITH ANESTHESIA
Anesthesia: General

## 2021-02-08 MED ORDER — SUGAMMADEX SODIUM 200 MG/2ML IV SOLN
INTRAVENOUS | Status: DC | PRN
  Administered 2021-02-08: 400 mg via INTRAVENOUS

## 2021-02-08 MED ORDER — DEXAMETHASONE SODIUM PHOSPHATE 10 MG/ML IJ SOLN
INTRAMUSCULAR | Status: DC | PRN
  Administered 2021-02-08: 5 mg via INTRAVENOUS

## 2021-02-08 MED ORDER — ROCURONIUM BROMIDE 100 MG/10ML IV SOLN
INTRAVENOUS | Status: DC | PRN
  Administered 2021-02-08: 80 mg via INTRAVENOUS

## 2021-02-08 MED ORDER — LIDOCAINE 2% (20 MG/ML) 5 ML SYRINGE
INTRAMUSCULAR | Status: DC | PRN
  Administered 2021-02-08: 60 mg via INTRAVENOUS

## 2021-02-08 MED ORDER — ONDANSETRON HCL 4 MG/2ML IJ SOLN
INTRAMUSCULAR | Status: DC | PRN
  Administered 2021-02-08: 4 mg via INTRAVENOUS

## 2021-02-08 MED ORDER — PHENYLEPHRINE HCL-NACL 20-0.9 MG/250ML-% IV SOLN
INTRAVENOUS | Status: DC | PRN
  Administered 2021-02-08: 25 ug/min via INTRAVENOUS

## 2021-02-08 MED ORDER — PROPOFOL 10 MG/ML IV BOLUS
INTRAVENOUS | Status: DC | PRN
  Administered 2021-02-08: 150 mg via INTRAVENOUS

## 2021-02-08 MED ORDER — GADOBUTROL 1 MMOL/ML IV SOLN
9.0000 mL | Freq: Once | INTRAVENOUS | Status: AC | PRN
  Administered 2021-02-08: 9 mL via INTRAVENOUS

## 2021-02-08 NOTE — Patient Outreach (Signed)
[facility]) Care Management  02/08/2021  Gerhardt Gleed Sr. 1950/12/17 [id number]  Patient was admitted to the hospital on 02/06/21. Nurse Health Coach will perform case closure and transfer patient to the [facility] CM Team. Nurse has informed [facility] Coordinator of patient's admission.    Plan: RN Health Coach Discipline Closure Discipline Closure letter sent to PCP   [name] RN, BSN Nurse Health Coach [facility] [phone] [email or web address]

## 2021-02-08 NOTE — Transfer of Care (Signed)
Immediate Anesthesia Transfer of Care Note  Patient: Kenneth Sic Sr.  Procedure(s) Performed: MRI WITH ANESTHESIA OF ABDOMEN WITH AND WITHOUT CONSTRAST  Patient Location: PACU  Anesthesia Type:General  Level of Consciousness: drowsy  Airway & Oxygen Therapy: Patient Spontanous Breathing and Patient connected to face mask oxygen  Post-op Assessment: Report given to RN and Post -op Vital signs reviewed and stable  Post vital signs: Reviewed and stable  Last Vitals:  Vitals Value Taken Time  BP 123/63 02/08/21 1508  Temp 36.3 C 02/08/21 1509  Pulse 55 02/08/21 1513  Resp 17 02/08/21 1513  SpO2 96 % 02/08/21 1513  Vitals shown include unvalidated device data.  Last Pain:  Vitals:   02/08/21 1509  TempSrc:   PainSc: Asleep         Complications: No notable events documented.

## 2021-02-08 NOTE — Anesthesia Preprocedure Evaluation (Addendum)
Anesthesia Evaluation  Patient identified by MRN, date of birth, ID band Patient awake    Reviewed: Allergy & Precautions, NPO status , Patient's Chart, lab work & pertinent test results  History of Anesthesia Complications (+) PONV, PROLONGED EMERGENCE and history of anesthetic complications (personal and FHx PONV, personal Hx prolonged emergence )  Airway Mallampati: III  TM Distance: >3 FB Neck ROM: Full    Dental  (+) Teeth Intact, Dental Advisory Given   Pulmonary shortness of breath and with exertion,  Snores, witnessed apneas, per pt had a sleep study 2-3 years ago and was negative for OSA    Pulmonary exam normal breath sounds clear to auscultation       Cardiovascular hypertension, Pt. on medications and Pt. on home beta blockers + angina (known unstable angina 2/2 vasospasm) with exertion + CAD and + Cardiac Stents (2016 last stent, 4 total)  Normal cardiovascular exam+ Valvular Problems/Murmurs (mild AI, mild MR) AI and MR  Rhythm:Regular Rate:Normal  Cath 2019 ? Previously placed Ost LAD to Mid LAD stent (unknown type) is widely patent. ? Previously placed Dist RCA stent (unknown type) is widely patent.   Coronary angiogram 05/23/2017: All stents placed previously are patent.  Proximal to mid LAD long stent (Overlaps prior stent) that was placed on 04/08/2014, circumflex/obtuse marginal-1 stenting. History of mid LAD stenting and balloon angioplasty to D1 and distal LAD performed on 03/29/2013, distal RCA stent in 2014. No change in ostial circumflex lesion, the mid LAD stent is widely patent however at the outflow, there is a 30-40% stenosis. Impression: Unstable angina pectoris probably precipitated by coronary spasm.  No intervention is necessary.  Patient will potentially be discharged home today if he remains chest pain-free with addition of a vasodilator.     Neuro/Psych  Headaches, TIAnegative psych ROS    GI/Hepatic Neg liver ROS, GERD  Controlled and Medicated,  Endo/Other  diabetes, Well Controlled, Type 2, Insulin Dependenta1c 7.9 FS 119  Renal/GU Renal InsufficiencyRenal diseaseCr 1.5  negative genitourinary   Musculoskeletal  (+) Arthritis , Osteoarthritis,    Abdominal (+) - obese,   Peds  Hematology  (+) Blood dyscrasia, , plt 89   Anesthesia Other Findings   Reproductive/Obstetrics negative OB ROS                           Anesthesia Physical Anesthesia Plan  ASA: 3  Anesthesia Plan: General   Post-op Pain Management: Tylenol PO (pre-op)   Induction: Intravenous  PONV Risk Score and Plan: 3 and Ondansetron, Dexamethasone, Midazolam and Treatment may vary due to age or medical condition  Airway Management Planned: LMA  Additional Equipment: None  Intra-op Plan:   Post-operative Plan: Extubation in OR  Informed Consent: I have reviewed the patients History and Physical, chart, labs and discussed the procedure including the risks, benefits and alternatives for the proposed anesthesia with the patient or authorized representative who has indicated his/her understanding and acceptance.   Patient has DNR.  Discussed DNR with patient and Suspend DNR.   Dental advisory given  Plan Discussed with: CRNA  Anesthesia Plan Comments: (Partial DNR does not want prolonged intubation- d/w patient that we will suspend DNR until out of PACU )      Anesthesia Quick Evaluation

## 2021-02-08 NOTE — Anesthesia Procedure Notes (Signed)
Procedure Name: Intubation Date/Time: 02/08/2021 1:52 PM Performed by: Elliot Dally, CRNA Pre-anesthesia Checklist: Patient identified, Emergency Drugs available, Suction available and Patient being monitored Patient Re-evaluated:Patient Re-evaluated prior to induction Oxygen Delivery Method: Circle System Utilized Preoxygenation: Pre-oxygenation with 100% oxygen Induction Type: IV induction Ventilation: Mask ventilation without difficulty and Oral airway inserted - appropriate to patient size Laryngoscope Size: Glidescope and 4 Grade View: Grade I Tube type: Oral Tube size: 7.5 mm Number of attempts: 1 Airway Equipment and Method: Stylet and Oral airway Placement Confirmation: ETT inserted through vocal cords under direct vision, positive ETCO2 and breath sounds checked- equal and bilateral Secured at: 22 cm Tube secured with: Tape Dental Injury: Teeth and Oropharynx as per pre-operative assessment  Comments: Elective glide scope intubation.

## 2021-02-08 NOTE — Progress Notes (Signed)
[facility]  PROGRESS NOTE  [name] Sr. ZOX:[id number] DOB: 1950/10/01 DOA: 02/06/2021 PCP: [name], NP   Brief HPI:   70 yr old male with h/o hyperlipidemia, CAD s/p stent, Diabetes mellitus type 2, CKD stage 3, RLS, recent admission for TIA presenting with abdominal pain. He has been having epigastric pain for 3 months. Pain radiates into chest , worsened with exertion. He was seen by cardiology and cleared for any GI procedures. Gastroenterology was consulted, Patient underwent MRCP today under general anesthesia due to continuous lower extremity myoclonic jerks from RLS.   Subjective   Patient back from MRI, denies any complaints.   Assessment/Plan:     Transaminitis/abdominal pain -Unclear etiology -Cleared by cardiology for GI procedures -LFTs were significantly elevated; now improved -Ultrasound was negative for gallstones but showed apparent hepatocellular liver disease -CT abdomen pelvis showed cholelithiasis without cholecystitis -MRCP done today for further evaluation; result pending -Continue IV Protonix -We will start on carb modified diet -Keep n.p.o. after midnight  Acute kidney injury -Patient's creatinine was 1.81 yesterday; improved to 1.50 today with IV LR -Continue LR at 100 cc/h -Follow BMP in am  Left renal lesion -1.4 cm left renal lesion seen on the CT scan -MRI renal recommended, this was added to MRCP done today -We will follow the results  CAD -S/p stent placement -Continue Plavix -Dr. [name] as outpatient on 12/5 with initial plan for cath versus stress test, patient preferred stress test -He saw patient yesterday and cleared for GI procedure  Hypertension -Blood pressure is stable -Continue Toprol-XL -Diovan and Farxiga on hold in setting of renal dysfunction  Restless leg syndrome -Patient has continuous lower extremity myoclonic jerks -Continue Mirapex as per home schedule -Had to be put under general anesthesia  today for MRI of the abdomen  Diabetes mellitus type 2 -Hemoglobin A1c was 7.3  -Farxiga on hold -Continue Lantus, sliding scale insulin NovoLog -CBG well controlled  Hyperlipidemia -Patient reported Lipitor led to worsening of RLS -He is not taking Praluent     Medications     clopidogrel  75 mg Oral Daily   docusate sodium  100 mg Oral BID   gabapentin  200 mg Oral QHS   insulin aspart  0-15 Units Subcutaneous TID WC   insulin aspart  0-5 Units Subcutaneous QHS   insulin glargine-yfgn  40 Units Subcutaneous Daily   metoprolol succinate  25 mg Oral Daily   pantoprazole (PROTONIX) IV  40 mg Intravenous Q24H   pramipexole  1 mg Oral Daily   pramipexole  1 mg Oral Daily   pramipexole  2 mg Oral QHS     Data Reviewed:   CBG:  Recent Labs  Lab 02/08/21 0335 02/08/21 0735 02/08/21 1032 02/08/21 1508 02/08/21 1558  GLUCAP 120* 148* 119* 110* 118*    SpO2: 92 % O2 Flow Rate (L/min): 4 L/min    Vitals:   02/08/21 1032 02/08/21 1509 02/08/21 1524 02/08/21 1539  BP:   123/65 122/72  Pulse: 63  (!) 57 60  Resp:   19 (!) 22  Temp:  (!) 97.4 F (36.3 C)  98.3 F (36.8 C)  TempSrc:    Oral  SpO2:   91% 92%  Weight:      Height:         Intake/Output Summary (Last 24 hours) at 02/08/2021 1730 Last data filed at 02/08/2021 1509 Gross per 24 hour  Intake 1869.07 ml  Output --  Net 1869.07 ml  12/06 1901 - 12/08 0700 In: 1469.1 [I.V.:1469.1] Out: 400 [Urine:400]  Filed Weights   02/08/21 0951  Weight: 90.7 kg    Data Reviewed: Basic Metabolic Panel: Recent Labs  Lab 02/01/21 1833 02/06/21 1430 02/07/21 0826 02/08/21 0225  NA 135 135 134* 130*  K 4.8 4.7 5.0 4.0  CL 99 98 98 101  CO2 28 28 27  21*  GLUCOSE 295* 277* 315* 122*  BUN 22 19 25* 24*  CREATININE 1.21 1.48* 1.81* 1.50*  CALCIUM 10.2 9.8 8.5* 8.4*   Liver Function Tests: Recent Labs  Lab 02/06/21 1430 02/07/21 0826 02/08/21 0225  AST 474* 270* 125*  ALT 356* 451* 263*   ALKPHOS 91 88 69  BILITOT 3.5* 5.7* 3.6*  PROT 7.2 6.4* 5.6*  ALBUMIN 4.6 3.4* 2.9*   Recent Labs  Lab 02/06/21 1430 02/07/21 1430  LIPASE 24 26  AMYLASE  --  36   No results for input(s): AMMONIA in the last 168 hours. CBC: Recent Labs  Lab 02/01/21 1833 02/06/21 1430 02/07/21 0826 02/08/21 0225  WBC 8.2 9.7 9.2 5.5  NEUTROABS  --   --  8.4*  --   HGB 15.2 14.9 14.3 12.5*  HCT 44.6 44.1 42.3 37.8*  MCV 85.9 87.7 88.9 90.4  PLT 137* 112* 102* 89*   Cardiac Enzymes: No results for input(s): CKTOTAL, CKMB, CKMBINDEX, TROPONINI in the last 168 hours. BNP (last 3 results) Recent Labs    01/03/21 0825  BNP 16.8    ProBNP (last 3 results) No results for input(s): PROBNP in the last 8760 hours.  CBG: Recent Labs  Lab 02/08/21 0335 02/08/21 0735 02/08/21 1032 02/08/21 1508 02/08/21 1558  GLUCAP 120* 148* 119* 110* 118*       Radiology Reports  CT ABDOMEN PELVIS W CONTRAST  Result Date: 02/06/2021 CLINICAL DATA:  Abdominal pain, fever.  History of hernia repair EXAM: CT ABDOMEN AND PELVIS WITH CONTRAST TECHNIQUE: Multidetector CT imaging of the abdomen and pelvis was performed using the standard protocol following bolus administration of intravenous contrast. CONTRAST:  OMNIPAQUE IOHEXOL 300 MG/ML  SOLN COMPARISON:  CT abdomen pelvis 04/20/2018 FINDINGS: Lower chest: No acute abnormality. Hepatobiliary: The hepatic parenchyma is diffusely mildly hypodense compared to the splenic parenchyma consistent with fatty infiltration. No focal liver abnormality. Gallstone noted within the gallbladder lumen. No gallbladder wall thickening or pericholecystic fluid. No biliary dilatation. Pancreas: No focal lesion. Normal pancreatic contour. No surrounding inflammatory changes. No main pancreatic ductal dilatation. Spleen: Normal in size without focal abnormality. Adrenals/Urinary Tract: No adrenal nodule bilaterally. Bilateral kidneys enhance symmetrically. There is a 1.4  cm left renal lesion with a density of 25 Hounsfield units. No hydronephrosis. No hydroureter. The urinary bladder is unremarkable. On delayed imaging, there is no urothelial wall thickening and there are no filling defects in the opacified portions of the bilateral collecting systems or ureters. Stomach/Bowel: Stomach is within normal limits. No evidence of bowel wall thickening or dilatation. Appendix appears normal. Vascular/Lymphatic: No abdominal aorta or iliac aneurysm. Moderate to severe atherosclerotic plaque of the aorta and its branches. No abdominal, pelvic, or inguinal lymphadenopathy. Reproductive: Prostate is unremarkable. Other: No intraperitoneal free fluid. No intraperitoneal free gas. No organized fluid collection. Musculoskeletal: No abdominal wall hernia or abnormality. No suspicious lytic or blastic osseous lesions. No acute displaced fracture. Multilevel degenerative changes of the spine. IMPRESSION: 1. Cholelithiasis with no CT findings of acute cholecystitis. 2. Possible mild hepatic steatosis. 3. Indeterminate 1.4 cm left renal lesion. Recommend MRI renal  protocol for further evaluation. 4.  Aortic Atherosclerosis (ICD10-I70.0). Electronically Signed   By: Iven Finn M.D.   On: 02/06/2021 19:40       Antibiotics: Anti-infectives (From admission, onward)    Start     Dose/Rate Route Frequency Ordered Stop   02/06/21 2145  cefTRIAXone (ROCEPHIN) 2 g in sodium chloride 0.9 % 100 mL IVPB        2 g 200 mL/hr over 30 Minutes Intravenous  Once 02/06/21 2132 02/06/21 2246         DVT prophylaxis: SCDs  Code Status: Full code  Family Communication: No family at bedside   Consultants: Gastroenterology  Procedures: Intubation for MRI    Objective    Physical Examination:   General-appears in no acute distress Heart-S1-S2, regular, no murmur auscultated Lungs-clear to auscultation bilaterally, no wheezing or crackles auscultated Abdomen-soft, nontender, no  organomegaly Extremities-no edema in the lower extremities Neuro-alert, oriented x3, no focal deficit noted  Status is: Inpatient  Dispo: The patient is from: Home              Anticipated d/c is to: Home              Anticipated d/c date is: 02/10/2021              Patient currently not stable for discharge  Barrier to discharge-ongoing evaluation for transaminitis  COVID-19 Labs  No results for input(s): DDIMER, FERRITIN, LDH, CRP in the last 72 hours.  Lab Results  Component Value Date   SARSCOV2NAA NEGATIVE 02/06/2021   [place] NEGATIVE 01/14/2021   SARSCOV2NAA NOT DETECTED 10/04/2020   SARSCOV2NAA NOT DETECTED 08/01/2020            Recent Results (from the past 240 hour(s))  Resp Panel by RT-PCR (Flu A&B, Covid) Nasopharyngeal Swab     Status: None   Collection Time: 02/06/21  2:30 PM   Specimen: Nasopharyngeal Swab; Nasopharyngeal(NP) swabs in vial transport medium  Result Value Ref Range Status   SARS Coronavirus 2 by RT PCR NEGATIVE NEGATIVE Final    Comment: (NOTE) SARS-CoV-2 target nucleic acids are NOT DETECTED.  The SARS-CoV-2 RNA is generally detectable in upper respiratory specimens during the acute phase of infection. The lowest concentration of SARS-CoV-2 viral copies this assay can detect is 138 copies/mL. A negative result does not preclude SARS-Cov-2 infection and should not be used as the sole basis for treatment or other patient management decisions. A negative result may occur with  improper specimen collection/handling, submission of specimen other than nasopharyngeal swab, presence of viral mutation(s) within the areas targeted by this assay, and inadequate number of viral copies(<138 copies/mL). A negative result must be combined with clinical observations, patient history, and epidemiological information. The expected result is Negative.  Fact Sheet for Patients:  EntrepreneurPulse.com.au  Fact Sheet for  Healthcare Providers:  IncredibleEmployment.be  This test is no t yet approved or cleared by the [place] FDA and  has been authorized for detection and/or diagnosis of SARS-CoV-2 by FDA under an Emergency Use Authorization (EUA). This EUA will remain  in effect (meaning this test can be used) for the duration of the COVID-19 declaration under Section 564(b)(1) of the Act, 21 U.S.C.section 360bbb-3(b)(1), unless the authorization is terminated  or revoked sooner.       Influenza A by PCR NEGATIVE NEGATIVE Final   Influenza B by PCR NEGATIVE NEGATIVE Final    Comment: (NOTE) The Xpert Xpress SARS-CoV-2/FLU/RSV plus assay is intended as an  aid in the diagnosis of influenza from Nasopharyngeal swab specimens and should not be used as a sole basis for treatment. Nasal washings and aspirates are unacceptable for Xpert Xpress SARS-CoV-2/FLU/RSV testing.  Fact Sheet for Patients: EntrepreneurPulse.com.au  Fact Sheet for Healthcare Providers: IncredibleEmployment.be  This test is not yet approved or cleared by the [place] FDA and has been authorized for detection and/or diagnosis of SARS-CoV-2 by FDA under an Emergency Use Authorization (EUA). This EUA will remain in effect (meaning this test can be used) for the duration of the COVID-19 declaration under Section 564(b)(1) of the Act, 21 U.S.C. section 360bbb-3(b)(1), unless the authorization is terminated or revoked.  Performed at [facility], [address], [place], [place] [address]   MRSA Next Gen by PCR, Nasal     Status: None   Collection Time: 02/07/21  6:10 AM   Specimen: Nasal Mucosa; Nasal Swab  Result Value Ref Range Status   MRSA by PCR Next Gen NOT DETECTED NOT DETECTED Final    Comment: (NOTE) The GeneXpert MRSA Assay (FDA approved for NASAL specimens only), is one component of a comprehensive MRSA colonization  surveillance program. It is not intended to diagnose MRSA infection nor to guide or monitor treatment for MRSA infections. Test performance is not FDA approved in patients less than 62 years old. Performed at [facility], [place] [address], [place], [place] [address]     [facility] If 7PM-7AM, please contact night-coverage at [email or web address], Office  [phone]   02/08/2021, 5:30 PM  LOS: 1 day

## 2021-02-09 ENCOUNTER — Encounter (HOSPITAL_COMMUNITY): Payer: Self-pay | Admitting: Radiology

## 2021-02-09 ENCOUNTER — Inpatient Hospital Stay (HOSPITAL_COMMUNITY): Payer: Medicare Other

## 2021-02-09 DIAGNOSIS — E7800 Pure hypercholesterolemia, unspecified: Secondary | ICD-10-CM

## 2021-02-09 DIAGNOSIS — K8020 Calculus of gallbladder without cholecystitis without obstruction: Principal | ICD-10-CM

## 2021-02-09 DIAGNOSIS — D696 Thrombocytopenia, unspecified: Secondary | ICD-10-CM

## 2021-02-09 LAB — COMPREHENSIVE METABOLIC PANEL
ALT: 194 U/L — ABNORMAL HIGH (ref 0–44)
AST: 74 U/L — ABNORMAL HIGH (ref 15–41)
Albumin: 2.9 g/dL — ABNORMAL LOW (ref 3.5–5.0)
Alkaline Phosphatase: 84 U/L (ref 38–126)
Anion gap: 8 (ref 5–15)
BUN: 25 mg/dL — ABNORMAL HIGH (ref 8–23)
CO2: 26 mmol/L (ref 22–32)
Calcium: 8.4 mg/dL — ABNORMAL LOW (ref 8.9–10.3)
Chloride: 98 mmol/L (ref 98–111)
Creatinine, Ser: 1.52 mg/dL — ABNORMAL HIGH (ref 0.61–1.24)
GFR, Estimated: 49 mL/min — ABNORMAL LOW (ref >60)
Glucose, Bld: 327 mg/dL — ABNORMAL HIGH (ref 70–99)
Potassium: 5.1 mmol/L (ref 3.5–5.1)
Sodium: 132 mmol/L — ABNORMAL LOW (ref 135–145)
Total Bilirubin: 1.9 mg/dL — ABNORMAL HIGH (ref 0.3–1.2)
Total Protein: 5.6 g/dL — ABNORMAL LOW (ref 6.5–8.1)

## 2021-02-09 LAB — MITOCHONDRIAL ANTIBODIES: Mitochondrial M2 Ab, IgG: <20 Units (ref 0.0–20.0)

## 2021-02-09 LAB — GLUCOSE, CAPILLARY
Glucose-Capillary: 236 mg/dL — ABNORMAL HIGH (ref 70–99)
Glucose-Capillary: 207 mg/dL — ABNORMAL HIGH (ref 70–99)
Glucose-Capillary: 186 mg/dL — ABNORMAL HIGH (ref 70–99)
Glucose-Capillary: 246 mg/dL — ABNORMAL HIGH (ref 70–99)
Glucose-Capillary: 245 mg/dL — ABNORMAL HIGH (ref 70–99)

## 2021-02-09 LAB — ANTI-SMOOTH MUSCLE ANTIBODY, IGG: F-Actin IgG: 3 Units (ref 0–19)

## 2021-02-09 LAB — HEPATITIS B SURFACE ANTIBODY, QUANTITATIVE: Hep B S AB Quant (Post): <3.1 m[IU]/mL — ABNORMAL LOW (ref >9.9)

## 2021-02-09 LAB — ALPHA-1-ANTITRYPSIN: A-1 Antitrypsin, Ser: 147 mg/dL (ref 101–187)

## 2021-02-09 MED ORDER — TECHNETIUM TC 99M MEBROFENIN IV KIT
5.0000 | PACK | Freq: Once | INTRAVENOUS | Status: AC | PRN
  Administered 2021-02-09: 5 via INTRAVENOUS

## 2021-02-09 NOTE — Consult Note (Signed)
   [facility] CM Inpatient Consult   02/09/2021  Kenneth Puccinelli Sr. 1950/08/13 [id number]  [facility] [[facility]]  Accountable Care Organization [ACO] Patient: Medicare CMS DCE  Primary Care Provider:  [name], NP, [facility] is listed to provide the [facility] follow up  Patient was active with a [facility] RN Health Coach prior to admission.   Patient is noted  as high risk score for unplanned readmission risk.    Plan:  Continue to follow progress and disposition to assess for post hospital care management needs.  Disposition and post hospital TOC needs are yet determined.  For questions contact:   Charlesetta Shanks, RN BSN CCM [facility]  [phone] business mobile phone Toll free office [phone]  Fax number: [phone] [email or web address]

## 2021-02-09 NOTE — Progress Notes (Addendum)
[facility]  PROGRESS NOTE  Kenneth Trbovich Sr. [id number] DOB: 05/21/1950 DOA: 02/06/2021 PCP: Lauree Chandler, NP   Brief HPI:   70 yr old male with h/o hyperlipidemia, CAD s/p stent, Diabetes mellitus type 2, CKD stage 3, RLS, recent admission for TIA presenting with abdominal pain. He has been having epigastric pain for 3 months. Pain radiates into chest , worsened with exertion. He was seen by cardiology and cleared for any GI procedures. Gastroenterology was consulted, Patient underwent MRCP today under general anesthesia due to continuous lower extremity myoclonic jerks from RLS.   Subjective   Denies any complaints this morning, MR CP/MR abdomen result came back, only shows cholelithiasis with no biliary dilatation.  Shows diffuse hepatic steatosis.  Benign cyst of left kidney.   Assessment/Plan:     Transaminitis/abdominal pain -MRCP only shows cholelithiasis, no biliary rotation -LFTs have improved,?  Passed stone -He was seen by cardiology and cleared for GI procedures -LFTs were significantly elevated; now improved -Ultrasound was negative for gallstones but showed apparent hepatocellular liver disease -CT abdomen pelvis showed cholelithiasis without cholecystitis -Continue IV Protonix -Started on diet yesterday without complication -He is currently n.p.o.   Acute kidney injury -Patient's creatinine was 1.81 yesterday; improved to 1.52 today with IV LR -Patient developing mild interstitial edema, will discontinue IV fluids -  Left renal lesion -1.4 cm left renal lesion seen on the CT scan -MRI renal was recommended, this was added to MRCP done today -MRI only shows benign cyst  CAD -S/p stent placement -Continue Plavix -Dr. Einar Gip as outpatient on 12/5 with initial plan for cath versus stress test, patient preferred stress test -He saw patient yesterday and cleared for GI procedure  Hypertension -Blood pressure is stable -Continue Toprol-XL -Diovan  and Farxiga on hold in setting of renal dysfunction  Restless leg syndrome -Patient has continuous lower extremity myoclonic jerks -Continue Mirapex as per home schedule -Had to be put under general anesthesia today for MRI of the abdomen  Diabetes mellitus type 2 -Hemoglobin A1c was 7.3  -Farxiga on hold -Continue Lantus, sliding scale insulin NovoLog -CBG elevated, he is currently n.p.o.  Hyperlipidemia -Patient reported Lipitor led to worsening of RLS -He is now taking Praluent    Medications     clopidogrel  75 mg Oral Daily   docusate sodium  100 mg Oral BID   gabapentin  200 mg Oral QHS   insulin aspart  0-15 Units Subcutaneous TID WC   insulin aspart  0-5 Units Subcutaneous QHS   insulin glargine-yfgn  40 Units Subcutaneous Daily   metoprolol succinate  25 mg Oral Daily   pantoprazole (PROTONIX) IV  40 mg Intravenous Q24H   pramipexole  1 mg Oral Daily   pramipexole  1 mg Oral Daily   pramipexole  2 mg Oral QHS     Data Reviewed:   CBG:  Recent Labs  Lab 02/08/21 1032 02/08/21 1508 02/08/21 1558 02/08/21 2112 02/09/21 0824  GLUCAP 119* 110* 118* 399* 236*    SpO2: 94 % O2 Flow Rate (L/min): 4 L/min    Vitals:   02/08/21 1941 02/08/21 2330 02/09/21 0342 02/09/21 0828  BP: 138/83 (!) 142/78 133/78 (!) 159/76  Pulse: 77 65 (!) 57 (!) 58  Resp: 17 15 17 17   Temp: 98.3 F (36.8 C) 98.2 F (36.8 C) 98.3 F (36.8 C) 98.3 F (36.8 C)  TempSrc: Oral Oral Oral Oral  SpO2: 94% 91% 96% 94%  Weight:      Height:  Intake/Output Summary (Last 24 hours) at 02/09/2021 0905 Last data filed at 02/08/2021 1509 Gross per 24 hour  Intake 400 ml  Output --  Net 400 ml    12/07 1901 - 12/09 0700 In: 1288.9 [I.V.:1288.9] Out: -   Filed Weights   02/08/21 0951  Weight: 90.7 kg    Data Reviewed: Basic Metabolic Panel: Recent Labs  Lab 02/06/21 1430 02/07/21 0826 02/08/21 0225 02/09/21 0240  NA 135 134* 130* 132*  K 4.7 5.0 4.0 5.1  CL  98 98 101 98  CO2 28 27 21* 26  GLUCOSE 277* 315* 122* 327*  BUN 19 25* 24* 25*  CREATININE 1.48* 1.81* 1.50* 1.52*  CALCIUM 9.8 8.5* 8.4* 8.4*   Liver Function Tests: Recent Labs  Lab 02/06/21 1430 02/07/21 0826 02/08/21 0225 02/09/21 0240  AST 474* 270* 125* 74*  ALT 356* 451* 263* 194*  ALKPHOS 91 88 69 84  BILITOT 3.5* 5.7* 3.6* 1.9*  PROT 7.2 6.4* 5.6* 5.6*  ALBUMIN 4.6 3.4* 2.9* 2.9*   Recent Labs  Lab 02/06/21 1430 02/07/21 1430  LIPASE 24 26  AMYLASE  --  36   No results for input(s): AMMONIA in the last 168 hours. CBC: Recent Labs  Lab 02/06/21 1430 02/07/21 0826 02/08/21 0225  WBC 9.7 9.2 5.5  NEUTROABS  --  8.4*  --   HGB 14.9 14.3 12.5*  HCT 44.1 42.3 37.8*  MCV 87.7 88.9 90.4  PLT 112* 102* 89*   Cardiac Enzymes: No results for input(s): CKTOTAL, CKMB, CKMBINDEX, TROPONINI in the last 168 hours. BNP (last 3 results) Recent Labs    01/03/21 0825  BNP 16.8    ProBNP (last 3 results) No results for input(s): PROBNP in the last 8760 hours.  CBG: Recent Labs  Lab 02/08/21 1032 02/08/21 1508 02/08/21 1558 02/08/21 2112 02/09/21 0824  GLUCAP 119* 110* 118* 399* 236*       Radiology Reports  MR 3D Recon At Scanner  Result Date: 02/08/2021 CLINICAL DATA:  Right upper quadrant abdominal pain. Stage 3 chronic kidney disease. Epigastric pain. Diabetes. Indeterminate left renal lesion on recent CT scan EXAM: MRI ABDOMEN WITHOUT AND WITH CONTRAST (INCLUDING MRCP) TECHNIQUE: Multiplanar multisequence MR imaging of the abdomen was performed both before and after the administration of intravenous contrast. Heavily T2-weighted images of the biliary and pancreatic ducts were obtained, and three-dimensional MRCP images were rendered by post processing. CONTRAST:  37mL GADAVIST GADOBUTROL 1 MMOL/ML IV SOLN COMPARISON:  CT abdomen and ultrasound abdomen 02/06/2021 FINDINGS: Lower chest: Dependent reticulation in the lower lobes greater than that shown on  recent CT, suspicious for potential dependent interstitial edema and/or subsegmental atelectasis. Hepatobiliary: Diffuse hepatic steatosis. Sludge and small gallstones in the gallbladder. Mild perihepatic fluid or gallbladder wall thickening along the margin with the liver. No biliary dilatation. No filling defect in the common bile duct or common hepatic duct. Trace nonspecific periportal edema. Diffuse micronodular heterogeneous enhancement of the liver on arterial phase images but subsequent normal hepatic enhancement on portal venous phase and delayed images. Pancreas:  Unremarkable Spleen:  No splenomegaly or focal splenic lesion identified. Adrenals/Urinary Tract:  Both adrenal glands appear normal. Bosniak category 2 cyst of the left kidney lower pole measuring 1.4 by 1.2 by 1.5 cm, with high T1 and low T2 signal density component posteriorly. This lesion does not enhance. Adrenal glands unremarkable. Stomach/Bowel: Bilobed periampullary duodenal diverticulum. No dilated bowel. Vascular/Lymphatic: Atherosclerosis is present, including aortoiliac atherosclerotic disease. There is some atherosclerotic plaque along  the origins of the SMA and celiac trunk. Other:  No supplemental non-categorized findings. Musculoskeletal: Unremarkable IMPRESSION: 1. Cholelithiasis with gallstones. Trace perihepatic fluid or gallbladder wall thickening along the margin with the liver. No biliary dilatation. 2. Increased (from 02/06/2021) reticulation and faint enhancement dependently in both lower lobes, query interstitial edema and/or atelectasis. 3. Trace nonspecific periportal edema. 4. Diffuse hepatic steatosis. Mildly heterogeneous arterial phase enhancement in the liver, I would suggest hepatitis panel to exclude the unlikely possibility of hepatitis. Later phase liver imaging appears normal. 5. Bosniak category 2 benign cyst of the left kidney lower pole (benign). 6. Atherosclerosis.  Aortic Atherosclerosis (ICD10-I70.0).  Electronically Signed   By: Gaylyn Rong M.D.   On: 02/08/2021 17:26   MR ABDOMEN MRCP W WO CONTAST  Result Date: 02/08/2021 CLINICAL DATA:  Right upper quadrant abdominal pain. Stage 3 chronic kidney disease. Epigastric pain. Diabetes. Indeterminate left renal lesion on recent CT scan EXAM: MRI ABDOMEN WITHOUT AND WITH CONTRAST (INCLUDING MRCP) TECHNIQUE: Multiplanar multisequence MR imaging of the abdomen was performed both before and after the administration of intravenous contrast. Heavily T2-weighted images of the biliary and pancreatic ducts were obtained, and three-dimensional MRCP images were rendered by post processing. CONTRAST:  76mL GADAVIST GADOBUTROL 1 MMOL/ML IV SOLN COMPARISON:  CT abdomen and ultrasound abdomen 02/06/2021 FINDINGS: Lower chest: Dependent reticulation in the lower lobes greater than that shown on recent CT, suspicious for potential dependent interstitial edema and/or subsegmental atelectasis. Hepatobiliary: Diffuse hepatic steatosis. Sludge and small gallstones in the gallbladder. Mild perihepatic fluid or gallbladder wall thickening along the margin with the liver. No biliary dilatation. No filling defect in the common bile duct or common hepatic duct. Trace nonspecific periportal edema. Diffuse micronodular heterogeneous enhancement of the liver on arterial phase images but subsequent normal hepatic enhancement on portal venous phase and delayed images. Pancreas:  Unremarkable Spleen:  No splenomegaly or focal splenic lesion identified. Adrenals/Urinary Tract:  Both adrenal glands appear normal. Bosniak category 2 cyst of the left kidney lower pole measuring 1.4 by 1.2 by 1.5 cm, with high T1 and low T2 signal density component posteriorly. This lesion does not enhance. Adrenal glands unremarkable. Stomach/Bowel: Bilobed periampullary duodenal diverticulum. No dilated bowel. Vascular/Lymphatic: Atherosclerosis is present, including aortoiliac atherosclerotic disease. There  is some atherosclerotic plaque along the origins of the SMA and celiac trunk. Other:  No supplemental non-categorized findings. Musculoskeletal: Unremarkable IMPRESSION: 1. Cholelithiasis with gallstones. Trace perihepatic fluid or gallbladder wall thickening along the margin with the liver. No biliary dilatation. 2. Increased (from 02/06/2021) reticulation and faint enhancement dependently in both lower lobes, query interstitial edema and/or atelectasis. 3. Trace nonspecific periportal edema. 4. Diffuse hepatic steatosis. Mildly heterogeneous arterial phase enhancement in the liver, I would suggest hepatitis panel to exclude the unlikely possibility of hepatitis. Later phase liver imaging appears normal. 5. Bosniak category 2 benign cyst of the left kidney lower pole (benign). 6. Atherosclerosis.  Aortic Atherosclerosis (ICD10-I70.0). Electronically Signed   By: Gaylyn Rong M.D.   On: 02/08/2021 17:26       Antibiotics: Anti-infectives (From admission, onward)    Start     Dose/Rate Route Frequency Ordered Stop   02/06/21 2145  cefTRIAXone (ROCEPHIN) 2 g in sodium chloride 0.9 % 100 mL IVPB        2 g 200 mL/hr over 30 Minutes Intravenous  Once 02/06/21 2132 02/06/21 2246         DVT prophylaxis: SCDs  Code Status: Full code  Family Communication: No family  at bedside   Consultants: Gastroenterology  Procedures: Intubation for MRI    Objective    Physical Examination:   General-appears in no acute distress Heart-S1-S2, regular, no murmur auscultated Lungs-bibasilar crackles Abdomen-soft, nontender, no organomegaly Extremities-no edema in the lower extremities Neuro-alert, oriented x3, no focal deficit noted  Status is: Inpatient  Dispo: The patient is from: Home              Anticipated d/c is to: Home              Anticipated d/c date is: 02/10/2021              Patient currently not stable for discharge  Barrier to discharge-ongoing evaluation for  transaminitis  COVID-19 Labs  No results for input(s): DDIMER, FERRITIN, LDH, CRP in the last 72 hours.  Lab Results  Component Value Date   SARSCOV2NAA NEGATIVE 02/06/2021   SARSCOV2NAA NEGATIVE 01/14/2021   SARSCOV2NAA NOT DETECTED 10/04/2020   SARSCOV2NAA NOT DETECTED 08/01/2020            Recent Results (from the past 240 hour(s))  Resp Panel by RT-PCR (Flu A&B, Covid) Nasopharyngeal Swab     Status: None   Collection Time: 02/06/21  2:30 PM   Specimen: Nasopharyngeal Swab; Nasopharyngeal(NP) swabs in vial transport medium  Result Value Ref Range Status   SARS Coronavirus 2 by RT PCR NEGATIVE NEGATIVE Final    Comment: (NOTE) SARS-CoV-2 target nucleic acids are NOT DETECTED.  The SARS-CoV-2 RNA is generally detectable in upper respiratory specimens during the acute phase of infection. The lowest concentration of SARS-CoV-2 viral copies this assay can detect is 138 copies/mL. A negative result does not preclude SARS-Cov-2 infection and should not be used as the sole basis for treatment or other patient management decisions. A negative result may occur with  improper specimen collection/handling, submission of specimen other than nasopharyngeal swab, presence of viral mutation(s) within the areas targeted by this assay, and inadequate number of viral copies(<138 copies/mL). A negative result must be combined with clinical observations, patient history, and epidemiological information. The expected result is Negative.  Fact Sheet for Patients:  BloggerCourse.comhttps://www.fda.gov/media/152166/download  Fact Sheet for Healthcare Providers:  SeriousBroker.ithttps://www.fda.gov/media/152162/download  This test is no t yet approved or cleared by the [place] FDA and  has been authorized for detection and/or diagnosis of SARS-CoV-2 by FDA under an Emergency Use Authorization (EUA). This EUA will remain  in effect (meaning this test can be used) for the duration of the COVID-19 declaration under  Section 564(b)(1) of the Act, 21 U.S.C.section 360bbb-3(b)(1), unless the authorization is terminated  or revoked sooner.       Influenza A by PCR NEGATIVE NEGATIVE Final   Influenza B by PCR NEGATIVE NEGATIVE Final    Comment: (NOTE) The Xpert Xpress SARS-CoV-2/FLU/RSV plus assay is intended as an aid in the diagnosis of influenza from Nasopharyngeal swab specimens and should not be used as a sole basis for treatment. Nasal washings and aspirates are unacceptable for Xpert Xpress SARS-CoV-2/FLU/RSV testing.  Fact Sheet for Patients: BloggerCourse.comhttps://www.fda.gov/media/152166/download  Fact Sheet for Healthcare Providers: SeriousBroker.ithttps://www.fda.gov/media/152162/download  This test is not yet approved or cleared by the [place] FDA and has been authorized for detection and/or diagnosis of SARS-CoV-2 by FDA under an Emergency Use Authorization (EUA). This EUA will remain in effect (meaning this test can be used) for the duration of the COVID-19 declaration under Section 564(b)(1) of the Act, 21 U.S.C. section 360bbb-3(b)(1), unless the authorization is terminated or revoked.  Performed at [facility], [address], [place], [place] [address]   MRSA Next Gen by PCR, Nasal     Status: None   Collection Time: 02/07/21  6:10 AM   Specimen: Nasal Mucosa; Nasal Swab  Result Value Ref Range Status   MRSA by PCR Next Gen NOT DETECTED NOT DETECTED Final    Comment: (NOTE) The GeneXpert MRSA Assay (FDA approved for NASAL specimens only), is one component of a comprehensive MRSA colonization surveillance program. It is not intended to diagnose MRSA infection nor to guide or monitor treatment for MRSA infections. Test performance is not FDA approved in patients less than 10 years old. Performed at [facility], [place] [address], [place], [place] [address]     [facility] If 7PM-7AM, please contact night-coverage at [email or web address], Office   [phone]   02/09/2021, 9:05 AM  LOS: 2 days

## 2021-02-09 NOTE — Care Management Important Message (Signed)
Important Message  Patient Details  Name: Kenneth Schwandt Sr. MRN: [id number] Date of Birth: 12/31/1950   Medicare Important Message Given:  Yes     Sherilyn Banker 02/09/2021, 12:40 PM

## 2021-02-09 NOTE — Anesthesia Postprocedure Evaluation (Signed)
Anesthesia Post Note  Patient: Kenneth Montelongo Sr.  Procedure(s) Performed: MRI WITH ANESTHESIA OF ABDOMEN WITH AND WITHOUT CONSTRAST     Patient location during evaluation: PACU Anesthesia Type: General Level of consciousness: patient cooperative and awake Pain management: pain level controlled Vital Signs Assessment: post-procedure vital signs reviewed and stable Respiratory status: spontaneous breathing, nonlabored ventilation, respiratory function stable and patient connected to nasal cannula oxygen Cardiovascular status: blood pressure returned to baseline and stable Postop Assessment: no apparent nausea or vomiting Anesthetic complications: no   No notable events documented.  Last Vitals:  Vitals:   02/09/21 0828 02/09/21 1107  BP: (!) 159/76 (!) 167/84  Pulse: (!) 58 (!) 58  Resp: 17 17  Temp: 36.8 C 36.6 C  SpO2: 94% 94%    Last Pain:  Vitals:   02/09/21 1107  TempSrc: Oral  PainSc:                  [name]

## 2021-02-09 NOTE — Progress Notes (Addendum)
[facility] Progress Note  CC:  Elevated LFTs and epigastric abdominal pain  Subjective:  Feels ok.  No abdominal pain currently, again emphasizes that it is with exertion.    Objective:  Vital signs in last 24 hours: Temp:  [97.4 F (36.3 C)-98.3 F (36.8 C)] 98.3 F (36.8 C) (12/09 0828) Pulse Rate:  [57-77] 58 (12/09 0828) Resp:  [15-22] 17 (12/09 0828) BP: (122-159)/(65-83) 159/76 (12/09 0828) SpO2:  [91 %-96 %] 94 % (12/09 0828) Weight:  [90.7 kg] 90.7 kg (12/08 0951) Last BM Date:  (PTA) General:  Alert, Well-developed, in NAD Heart:  Regular rate and rhythm; no murmurs Pulm:  CTAB.  No W/R/R. Abdomen:  Soft, non-distended.  BS present.  Non-tender. Extremities:  Without edema. Neurologic:  Alert and oriented x 4;  grossly normal neurologically. Psych:  Alert and cooperative. Normal mood and affect.  Intake/Output from previous day: 12/08 0701 - 12/09 0700 In: 400 [I.V.:400] Out: -   Lab Results: Recent Labs    02/06/21 1430 02/07/21 0826 02/08/21 0225  WBC 9.7 9.2 5.5  HGB 14.9 14.3 12.5*  HCT 44.1 42.3 37.8*  PLT 112* 102* 89*   BMET Recent Labs    02/07/21 0826 02/08/21 0225 02/09/21 0240  NA 134* 130* 132*  K 5.0 4.0 5.1  CL 98 101 98  CO2 27 21* 26  GLUCOSE 315* 122* 327*  BUN 25* 24* 25*  CREATININE 1.81* 1.50* 1.52*  CALCIUM 8.5* 8.4* 8.4*   LFT Recent Labs    02/09/21 0240  PROT 5.6*  ALBUMIN 2.9*  AST 74*  ALT 194*  ALKPHOS 84  BILITOT 1.9*   PT/INR Recent Labs    02/08/21 0225  LABPROT 14.9  INR 1.2   Hepatitis Panel Recent Labs    02/08/21 0225  HEPBSAG NON REACTIVE  HCVAB NON REACTIVE  HEPBIGM NON REACTIVE    MR 3D Recon At Scanner  Result Date: 02/08/2021 CLINICAL DATA:  Right upper quadrant abdominal pain. Stage 3 chronic kidney disease. Epigastric pain. Diabetes. Indeterminate left renal lesion on recent CT scan EXAM: MRI ABDOMEN WITHOUT AND WITH CONTRAST (INCLUDING MRCP) TECHNIQUE:  Multiplanar multisequence MR imaging of the abdomen was performed both before and after the administration of intravenous contrast. Heavily T2-weighted images of the biliary and pancreatic ducts were obtained, and three-dimensional MRCP images were rendered by post processing. CONTRAST:  48m GADAVIST GADOBUTROL 1 MMOL/ML IV SOLN COMPARISON:  CT abdomen and ultrasound abdomen 02/06/2021 FINDINGS: Lower chest: Dependent reticulation in the lower lobes greater than that shown on recent CT, suspicious for potential dependent interstitial edema and/or subsegmental atelectasis. Hepatobiliary: Diffuse hepatic steatosis. Sludge and small gallstones in the gallbladder. Mild perihepatic fluid or gallbladder wall thickening along the margin with the liver. No biliary dilatation. No filling defect in the common bile duct or common hepatic duct. Trace nonspecific periportal edema. Diffuse micronodular heterogeneous enhancement of the liver on arterial phase images but subsequent normal hepatic enhancement on portal venous phase and delayed images. Pancreas:  Unremarkable Spleen:  No splenomegaly or focal splenic lesion identified. Adrenals/Urinary Tract:  Both adrenal glands appear normal. Bosniak category 2 cyst of the left kidney lower pole measuring 1.4 by 1.2 by 1.5 cm, with high T1 and low T2 signal density component posteriorly. This lesion does not enhance. Adrenal glands unremarkable. Stomach/Bowel: Bilobed periampullary duodenal diverticulum. No dilated bowel. Vascular/Lymphatic: Atherosclerosis is present, including aortoiliac atherosclerotic disease. There is some atherosclerotic plaque along the origins of the SMA and celiac  trunk. Other:  No supplemental non-categorized findings. Musculoskeletal: Unremarkable IMPRESSION: 1. Cholelithiasis with gallstones. Trace perihepatic fluid or gallbladder wall thickening along the margin with the liver. No biliary dilatation. 2. Increased (from 02/06/2021) reticulation and  faint enhancement dependently in both lower lobes, query interstitial edema and/or atelectasis. 3. Trace nonspecific periportal edema. 4. Diffuse hepatic steatosis. Mildly heterogeneous arterial phase enhancement in the liver, I would suggest hepatitis panel to exclude the unlikely possibility of hepatitis. Later phase liver imaging appears normal. 5. Bosniak category 2 benign cyst of the left kidney lower pole (benign). 6. Atherosclerosis.  Aortic Atherosclerosis (ICD10-I70.0). Electronically Signed   By: Van Clines M.D.   On: 02/08/2021 17:26   MR ABDOMEN MRCP W WO CONTAST  Result Date: 02/08/2021 CLINICAL DATA:  Right upper quadrant abdominal pain. Stage 3 chronic kidney disease. Epigastric pain. Diabetes. Indeterminate left renal lesion on recent CT scan EXAM: MRI ABDOMEN WITHOUT AND WITH CONTRAST (INCLUDING MRCP) TECHNIQUE: Multiplanar multisequence MR imaging of the abdomen was performed both before and after the administration of intravenous contrast. Heavily T2-weighted images of the biliary and pancreatic ducts were obtained, and three-dimensional MRCP images were rendered by post processing. CONTRAST:  45m GADAVIST GADOBUTROL 1 MMOL/ML IV SOLN COMPARISON:  CT abdomen and ultrasound abdomen 02/06/2021 FINDINGS: Lower chest: Dependent reticulation in the lower lobes greater than that shown on recent CT, suspicious for potential dependent interstitial edema and/or subsegmental atelectasis. Hepatobiliary: Diffuse hepatic steatosis. Sludge and small gallstones in the gallbladder. Mild perihepatic fluid or gallbladder wall thickening along the margin with the liver. No biliary dilatation. No filling defect in the common bile duct or common hepatic duct. Trace nonspecific periportal edema. Diffuse micronodular heterogeneous enhancement of the liver on arterial phase images but subsequent normal hepatic enhancement on portal venous phase and delayed images. Pancreas:  Unremarkable Spleen:  No  splenomegaly or focal splenic lesion identified. Adrenals/Urinary Tract:  Both adrenal glands appear normal. Bosniak category 2 cyst of the left kidney lower pole measuring 1.4 by 1.2 by 1.5 cm, with high T1 and low T2 signal density component posteriorly. This lesion does not enhance. Adrenal glands unremarkable. Stomach/Bowel: Bilobed periampullary duodenal diverticulum. No dilated bowel. Vascular/Lymphatic: Atherosclerosis is present, including aortoiliac atherosclerotic disease. There is some atherosclerotic plaque along the origins of the SMA and celiac trunk. Other:  No supplemental non-categorized findings. Musculoskeletal: Unremarkable IMPRESSION: 1. Cholelithiasis with gallstones. Trace perihepatic fluid or gallbladder wall thickening along the margin with the liver. No biliary dilatation. 2. Increased (from 02/06/2021) reticulation and faint enhancement dependently in both lower lobes, query interstitial edema and/or atelectasis. 3. Trace nonspecific periportal edema. 4. Diffuse hepatic steatosis. Mildly heterogeneous arterial phase enhancement in the liver, I would suggest hepatitis panel to exclude the unlikely possibility of hepatitis. Later phase liver imaging appears normal. 5. Bosniak category 2 benign cyst of the left kidney lower pole (benign). 6. Atherosclerosis.  Aortic Atherosclerosis (ICD10-I70.0). Electronically Signed   By: WVan ClinesM.D.   On: 02/08/2021 17:26    Assessment / Plan: 1.  Elevated LFTs: AST 474--> 270, ALT 356--> 451, total bili 3.5--> 5.7 (only comparison in 11/28/2015 with normal LFTs), patient denies previous history of being told this, right upper quadrant ultrasound with hepatic steatosis and possible cirrhosis, platelets are minimally decreased, no alcohol history, also with epigastric/atypical chest pain as below with nausea and vomiting acutely; consider possible choledocholithiasis versus liver disease i.e. hepatitis (autoimmune versus viral versus other).   MRCP is negative for any biliary ductal dilatation or sign of  CBD stones.  He does have trace periportal edema, gallstones, trace perihepatic fluid with gallbladder wall thickening.  Question if this is more of a subacute cholecystitis.  LFTs are improving with total bili 1.9, normal alk phos 84, ALT 194, AST 74. 2.  Epigastric pain/atypical chest pain: CT with cholelithiasis, though right upper quadrant ultrasound did not show this, patient with some pain that radiated from his epigastrium up into his chest with exertion over the past 3 months, plans per cardiologist for stress test given his history of multiple stents in the past; consider cardiac origin versus gastric versus musculoskeletal.  See above,  with findings on MRI/MRCP, wonder if this could be more of a subacute cholecystitis. 3.  History of CAD: Status post stents x4 on Plavix 4.  History of ulcer disease: Continues on Pantoprazole daily.  -We will plan for HIDA scan without CCK to see if the gallbladder fills to rule out cholecystitis.   LOS: 2 days   Laban Emperor. Zehr  02/09/2021, 9:42 AM   I have discussed the case with the APP, and that is the plan I formulated. I personally interviewed and examined the patient.  This overall clinical picture remains puzzling.  Patient had a brief episode of upper abdominal pain this morning, rated about a 5 out of 10, not exertional and not while eating. MRCP shows normal caliber bile duct, no stricture or apparent stone.  There was suggestion of some perihepatic/pericholecystic fluid, so we ordered HIDA scan that was normal. LFTs slowly improving.  He still has mild jaundice but looks well overall.  He is asking if he should start walking or doing exertion to see if he can precipitate the pain.  He also has worsening thrombocytopenia.  Baseline platelets in the 140s, now down to 89.  Not clear if part of the overall cause here (primary hematologic condition, question PNH but not anemic) or reacted  to this acute illness.  Patient may have passed small gallbladder stone or sludge through the bile duct, though his pattern of LFT elevation and lack of biliary ductal dilatation is not typical.  Nevertheless, we do not have another convincing explanation, and chronic liver diseases such as autoimmune or metabolic disease would not cause pain or a rise and fall of the LFTs.  As such, I am concerned that his recent pain may be biliary colic even though it has curiously been exertional by his report.  I have written a regular diet for him, and would like to get both surgical and hematology consults on him. Had a long talk with him and his wife, all questions answered.   35 minutes were spent on this encounter (including chart review, history/exam, counseling/coordination of care, and documentation) > 50% of that time was spent on counseling and coordination of care.   Nelida Meuse III Office:[phone]

## 2021-02-10 DIAGNOSIS — R1011 Right upper quadrant pain: Secondary | ICD-10-CM

## 2021-02-10 DIAGNOSIS — R7989 Other specified abnormal findings of blood chemistry: Secondary | ICD-10-CM

## 2021-02-10 LAB — COMPREHENSIVE METABOLIC PANEL
ALT: 151 U/L — ABNORMAL HIGH (ref 0–44)
AST: 52 U/L — ABNORMAL HIGH (ref 15–41)
Albumin: 3.2 g/dL — ABNORMAL LOW (ref 3.5–5.0)
Alkaline Phosphatase: 87 U/L (ref 38–126)
Anion gap: 8 (ref 5–15)
BUN: 19 mg/dL (ref 8–23)
CO2: 24 mmol/L (ref 22–32)
Calcium: 9 mg/dL (ref 8.9–10.3)
Chloride: 100 mmol/L (ref 98–111)
Creatinine, Ser: 1.29 mg/dL — ABNORMAL HIGH (ref 0.61–1.24)
GFR, Estimated: 59 mL/min — ABNORMAL LOW (ref >60)
Glucose, Bld: 215 mg/dL — ABNORMAL HIGH (ref 70–99)
Potassium: 4.2 mmol/L (ref 3.5–5.1)
Sodium: 132 mmol/L — ABNORMAL LOW (ref 135–145)
Total Bilirubin: 1.8 mg/dL — ABNORMAL HIGH (ref 0.3–1.2)
Total Protein: 6.4 g/dL — ABNORMAL LOW (ref 6.5–8.1)

## 2021-02-10 LAB — GLUCOSE, CAPILLARY
Glucose-Capillary: 167 mg/dL — ABNORMAL HIGH (ref 70–99)
Glucose-Capillary: 241 mg/dL — ABNORMAL HIGH (ref 70–99)
Glucose-Capillary: 289 mg/dL — ABNORMAL HIGH (ref 70–99)
Glucose-Capillary: 306 mg/dL — ABNORMAL HIGH (ref 70–99)
Glucose-Capillary: 197 mg/dL — ABNORMAL HIGH (ref 70–99)
Glucose-Capillary: 272 mg/dL — ABNORMAL HIGH (ref 70–99)
Glucose-Capillary: 218 mg/dL — ABNORMAL HIGH (ref 70–99)

## 2021-02-10 LAB — CBC
HCT: 41.5 % (ref 39.0–52.0)
Hemoglobin: 14 g/dL (ref 13.0–17.0)
MCH: 29.8 pg (ref 26.0–34.0)
MCHC: 33.7 g/dL (ref 30.0–36.0)
MCV: 88.3 fL (ref 80.0–100.0)
Platelets: 128 10*3/uL — ABNORMAL LOW (ref 150–400)
RBC: 4.7 MIL/uL (ref 4.22–5.81)
RDW: 12.9 % (ref 11.5–15.5)
WBC: 5.3 10*3/uL (ref 4.0–10.5)
nRBC: 0 % (ref 0.0–0.2)

## 2021-02-10 LAB — DIC (DISSEMINATED INTRAVASCULAR COAGULATION)PANEL
D-Dimer, Quant: 0.81 ug/mL-FEU — ABNORMAL HIGH (ref 0.00–0.50)
Fibrinogen: 654 mg/dL — ABNORMAL HIGH (ref 210–475)
INR: 0.9 (ref 0.8–1.2)
Platelets: 121 10*3/uL — ABNORMAL LOW (ref 150–400)
Prothrombin Time: 12.4 seconds (ref 11.4–15.2)
Smear Review: NONE SEEN
aPTT: 27 seconds (ref 24–36)

## 2021-02-10 LAB — LACTATE DEHYDROGENASE: LDH: 153 U/L (ref 98–192)

## 2021-02-10 LAB — SAVE SMEAR(SSMR), FOR PROVIDER SLIDE REVIEW

## 2021-02-10 MED ORDER — PANTOPRAZOLE SODIUM 40 MG PO TBEC: 40.0000 mg | DELAYED_RELEASE_TABLET | Freq: Every day | ORAL | Status: DC

## 2021-02-10 NOTE — Consult Note (Signed)
Consult Note  Kenneth Jim Sr. 25-Mar-1950  [id number].    Requesting MD: Dr. Darrick Meigs Chief Complaint/Reason for Consult: Cholelithiasis  HPI:  70 year old male with medical history significant for hyperlipidemia, Barrett's esophagus, CAD s/p stent, Diabetes mellitus type 2, CKD stage 3, RLS, recent admission for TIA who presented to ED with abdominal pain on 02/06/2021.  He reports ongoing intermittent gastric pain for the last 3 months that worsened leading up to his presentation to ED in addition to having a fever of .  He describes pain as sharp knifelike and radiating into his chest.  Pain only occurs with exertion.  He had 1 episode of nausea with the severe pain episode leading up to ED presentation.  During admission he has been evaluated by cardiology and GI.  Cardiac markers and EKG were nonischemic and cardiology with concern for GI etiology.  LFTs elevated on admission and remain so -T bili now 1.8 (admission 3.5 and max 5.7).  Otherwise labs remarkable for thrombocytopenia, platelets 128 (89).  He has had ultrasound of his gallbladder which showed no abnormal findings, CT which showed cholelithiasis, scan which showed patent CBD and cystic duct -no acute cholecystitis, MRCP which showed gallstones without biliary dilatation.  And cholelithiasis, general surgery was asked to see.  Patient states he has had no further nausea/emesis and has tolerated diet including spicy food prior to admission and since.  He has had no further pain episodes since admission but of note has not been very active.  No bowel movement since prior to admission -normally daily bowel movements.   Allergies: Lyrica, statins, Trulicity, Ativan Blood thinners: Alex Past Surgeries: Umbilical hernia repair [id number] Dr. Barry Dienes   ROS: Review of Systems  Constitutional:  Positive for fever (on admission). Negative for chills.  Respiratory:  Negative for cough, shortness of breath and wheezing.   Cardiovascular:   Negative for chest pain and leg swelling.  Gastrointestinal:  Positive for abdominal pain, constipation and nausea. Negative for diarrhea and vomiting.  Genitourinary: Negative.    Family History  Adopted: Yes  Problem Relation Age of Onset   Emphysema Mother     Past Medical History:  Diagnosis Date   Arthritis    "left thumb" (05/23/2017) right shoulder   Barrett's esophagus    "we've been told that it's all gone; still take RX for GERD" (05/23/2017)   Bilateral swelling of feet and ankles x 3 weeks as of 12-01-2019   CAD (coronary artery disease), native coronary artery    Coronary angiogram 05/03/2014:  Proximal LAD 3.0x12 mm Promus premier stent.  04/08/2014: Mid Cx 3.5 x 16 mm Promus DES.  03/29/2013: Mid LAD 2.75 x 38 mm Promus Premier drug-eluting stent, balloon angioplasty of D1 and distal LAD and stenting of distal RCA with 2.75 x 24 mm promos Premier drug-eluting stent 12/15/2012 widely patent.   Cervicalgia    Chronic bronchitis ([place])    "q yr" ([id number])   Complication of anesthesia    " I do not wake up very well "  slow to wake up   COVID-19 05/2019   all covid symptoms in hospital x 5 days, all symptoms resolved took monoclonal antibody tx    Dyspnea    with exertion   Family history of adverse reaction to anesthesia    "son w/PONV"   GERD (gastroesophageal reflux disease)    Heart murmur    "grew out of it"    Hyperlipidemia    Hypertension  Hypoglycemia, unspecified    IDDM (insulin dependent diabetes mellitus)    type 2   Impotence of organic origin    Iron deficiency anemia    Memory loss    mild   Other malaise and fatigue    Pneumonia 05/2019   PONV (postoperative nausea and vomiting)    after wisdom teeth pulled, no problems with other surgeries   Restless leg    Rotator cuff arthropathy    Stroke ([place])    Tension headache    "sometimes" (05/23/2017)   Unspecified gastritis and gastroduodenitis with hemorrhage    Unstable angina pectoris ([place])  04/08/2014   Coronary angiogram 05/03/2014:  Proximal LAD 3.0x12 mm Promus premier stent.  04/08/2014: Mid Cx 3.5 x 16 mm Promus DES.  03/29/2013: Mid LAD 2.75 x 38 mm Promus Premier drug-eluting stent, balloon angioplasty of D1 and distal LAD and stenting of distal RCA with 2.75 x 24 mm promos Premier drug-eluting stent 12/15/2012 widely patent.    Past Surgical History:  Procedure Laterality Date   BICEPT TENODESIS Right 12/07/2019   Procedure: RIGHT BICEPS TENODESIS;  Surgeon: Renette Butters, MD;  Location: [facility];  Service: Orthopedics;  Laterality: Right;   CARDIAC CATHETERIZATION N/A 09/25/2015   Procedure: Left Heart Cath and Coronary Angiography;  Surgeon: Adrian Prows, MD;  Location: [place] [facility];  Service: Cardiovascular;  Laterality: N/A;   CARDIAC CATHETERIZATION N/A 09/25/2015   Procedure: Intravascular Pressure Wire/FFR Study;  Surgeon: Adrian Prows, MD;  Location: [place] [facility];  Service: Cardiovascular;  Laterality: N/A;   CORONARY ANGIOPLASTY WITH STENT PLACEMENT  12/15/2012; 04/28/2014; 05/03/2014   "2; 1; 1" , 4 stents 2 balloons   FRACTIONAL FLOW RESERVE WIRE Right 03/26/2013   Procedure: FRACTIONAL FLOW RESERVE WIRE;  Surgeon: Laverda Page, MD;  Location: [facility];  Service: Cardiovascular;  Laterality: Right;   FRACTIONAL FLOW RESERVE WIRE N/A 05/03/2014   Procedure: FRACTIONAL FLOW RESERVE WIRE;  Surgeon: Laverda Page, MD;  Location: [facility] CATH LAB;  Service: Cardiovascular;  Laterality: N/A;   HERNIA REPAIR  [id number]   "umbilical"    INCISION AND DRAINAGE ABSCESS Left 05/2007   "groin"    KNEE SURGERY Right 1992;  2000   "calcium deposits removed" (12/15/2012)   LEFT HEART CATH AND CORONARY ANGIOGRAPHY N/A 05/23/2017   Procedure: LEFT HEART CATH AND CORONARY ANGIOGRAPHY;  Surgeon: Adrian Prows, MD;  Location: [place] [facility];  Service: Cardiovascular;  Laterality: N/A;   LEFT HEART CATHETERIZATION WITH CORONARY ANGIOGRAM N/A 12/15/2012    Procedure: LEFT HEART CATHETERIZATION WITH CORONARY ANGIOGRAM;  Surgeon: Laverda Page, MD;  Location: [facility] CATH LAB;  Service: Cardiovascular;  Laterality: N/A;   LEFT HEART CATHETERIZATION WITH CORONARY ANGIOGRAM N/A 03/26/2013   Procedure: LEFT HEART CATHETERIZATION WITH CORONARY ANGIOGRAM;  Surgeon: Laverda Page, MD;  Location: [facility] C[facility] LAB;  Service: Cardiovascular;  Laterality: N/A;   LEFT HEART CATHETERIZATION WITH CORONARY ANGIOGRAM N/A 04/08/2014   Procedure: LEFT HEART CATHETERIZATION WITH CORONARY ANGIOGRAM;  Surgeon: Blane Ohara, MD;  Location: [facility] CATH LAB;  Service: Cardiovascular;  Laterality: N/A;   PERCUTANEOUS CORONARY STENT INTERVENTION (PCI-S) N/A 05/03/2014   Procedure: PERCUTANEOUS CORONARY STENT INTERVENTION (PCI-S);  Surgeon: Laverda Page, MD;  Location: [facility] CATH LAB;  Service: Cardiovascular;  Laterality: N/A;   RADIOLOGY WITH ANESTHESIA N/A 02/08/2021   Procedure: MRI WITH ANESTHESIA OF ABDOMEN WITH AND WITHOUT CONSTRAST;  Surgeon: Radiologist, Medication, MD;  Location: [place];  Service: Radiology;  Laterality: N/A;   ROTATOR CUFF REPAIR     UMBILICAL HERNIA REPAIR  yrs ago    Social History:  reports that he has never smoked. He has never used smokeless tobacco. He reports that he does not drink alcohol and does not use drugs.  Allergies:  Allergies  Allergen Reactions   Lyrica [Pregabalin] Other (See Comments)    Made patient very lethargic the next morning, very hard to patient to function, move, etc.    Statins Other (See Comments)    Causes Restless Legs, rhabdomyolysis   Ativan [Lorazepam] Other (See Comments)    Markedly increased restless legs   Trulicity [Dulaglutide] Other (See Comments)    GI side effects     Medications Prior to Admission  Medication Sig Dispense Refill   acetaminophen (TYLENOL) 500 MG tablet Take 500 mg by mouth every 6 (six) hours as needed for mild pain, moderate pain, headache or fever.     albuterol (PROVENTIL  HFA;VENTOLIN HFA) 108 (90 Base) MCG/ACT inhaler Inhale 2 puffs into the lungs every 6 (six) hours as needed for wheezing or shortness of breath. 18 g 1   Alirocumab (PRALUENT) 150 MG/ML SOAJ Inject 1 pen into the skin every 14 (fourteen) days. 6 mL 3   Cholecalciferol (D-5000) 125 MCG (5000 UT) TABS Take 1 tablet (5,000 Units total) by mouth daily. E55.9 (Patient taking differently: Take 1 tablet by mouth daily.) 90 tablet 3   clopidogrel (PLAVIX) 75 MG tablet Take 1 tablet (75 mg total) by mouth daily. 30 tablet 0   dapagliflozin propanediol (FARXIGA) 10 MG TABS tablet Take 1 tablet (10 mg total) by mouth daily before breakfast. 90 tablet 3   famotidine (PEPCID) 20 MG tablet TAKE 1 TABLET BY MOUTH EVERY DAY (Patient taking differently: Take 20 mg by mouth daily.) 30 tablet 1   fluticasone (FLONASE) 50 MCG/ACT nasal spray Place 1 spray into both nostrils daily as needed for allergies or rhinitis.     gabapentin (NEURONTIN) 100 MG capsule Take 2 capsules (200 mg total) by mouth at bedtime. 180 capsule 1   glucose 4 GM chewable tablet Chew 1 tablet (4 g total) by mouth as needed for low blood sugar. 50 tablet 12   insulin aspart (NOVOLOG FLEXPEN) 100 UNIT/ML FlexPen Inject 15-20 Units into the skin 3 (three) times daily with meals. (Patient taking differently: Inject 20 Units into the skin 3 (three) times daily with meals.) 60 mL 3   Insulin Glargine (BASAGLAR KWIKPEN) 100 UNIT/ML Inject 40 units subcutaneously into the skin at bedtime (Patient taking differently: 40 Units at bedtime.) 45 mL 4   metoprolol succinate (TOPROL-XL) 25 MG 24 hr tablet Take 0.5 tablets (12.5 mg total) by mouth daily. Take with or immediately following a meal. (Patient taking differently: Take 25 mg by mouth daily.) 30 tablet 3   nitroGLYCERIN (NITROSTAT) 0.4 MG SL tablet Place 1 tablet (0.4 mg total) under the tongue every 5 (five) minutes as needed for chest pain. 50 tablet 0   pantoprazole (PROTONIX) 40 MG tablet Take one  tablet by mouth once daily for stomach (Patient taking differently: Take 40 mg by mouth daily.) 90 tablet 1   pramipexole (MIRAPEX) 1 MG tablet TAKE 1 TABLET BY MOUTH AT 5PM, 1 TABLET BY MOUTH AT 7PM, AND TAKE 2 TABLETS BY MOUTH AT BEDTIME. (Patient taking differently: Take 1-2 mg by mouth See admin instructions. Take 1mg  at 5pm, 1mg  at 7pm, and 2mg  at bedtime.) 360 tablet 1   valsartan (DIOVAN)  160 MG tablet Take 1 tablet (160 mg total) by mouth every evening. 90 tablet 3   BD PEN NEEDLE NANO 2ND GEN 32G X 4 MM MISC USE AS DIRECTED 100 each 3   Continuous Blood Gluc Receiver (FREESTYLE LIBRE 14 DAY READER) DEVI Inject 1 Device as directed daily as needed. Use once daily as direct to check blood sugar E11.51 1 each 0   Continuous Blood Gluc Sensor (FREESTYLE LIBRE 14 DAY SENSOR) MISC Use to test blood sugar three times daily. E11.51. E11.59 2 each 12   LIFESCAN FINEPOINT LANCETS MISC Use to test for blood sugar three times daily dx: E11.51 300 each 1   ONETOUCH ULTRA test strip USE TO TEST FOR BLOOD SUGAR THREE TIMES DAILY DX: E11.51 300 strip 11    Blood pressure (!) 158/76, pulse (!) 55, temperature 98 F (36.7 C), temperature source Oral, resp. rate 20, height 5\' 8"  (1.727 m), weight 90.7 kg, SpO2 97 %. Physical Exam: General: pleasant, WD, male who is sitting up in bedside chair in NAD HEENT: head is normocephalic, atraumatic.  Sclera are noninjected.  Pupils equal and round. EOMs intact.  Ears and nose without any masses or lesions.  Mouth is pink and moist Heart: regular, rate, and rhythm.  Normal s1,s2. No obvious murmurs, gallops, or rubs noted.  Palpable radial and pedal pulses bilaterally Lungs: CTAB, no wheezes, rhonchi, or rales noted.  Respiratory effort nonlabored Abd: soft, NT, ND -protuberant, +BS, no masses, hernias, or organomegaly.  No tenderness over right upper quadrant or epigastrium.  Negative Murphy sign MSK: all 4 extremities are symmetrical with no cyanosis, clubbing, or  edema. Skin: warm and dry with no masses, lesions, or rashes Neuro: Cranial nerves 2-12 grossly intact, sensation is normal throughout Psych: A&Ox3 with an appropriate affect.    Results for orders placed or performed during the hospital encounter of 02/06/21 (from the past 48 hour(s))  Glucose, capillary     Status: Abnormal   Collection Time: 02/08/21 10:32 AM  Result Value Ref Range   Glucose-Capillary 119 (H) 70 - 99 mg/dL    Comment: Glucose reference range applies only to samples taken after fasting for at least 8 hours.  Glucose, capillary     Status: Abnormal   Collection Time: 02/08/21  3:08 PM  Result Value Ref Range   Glucose-Capillary 110 (H) 70 - 99 mg/dL    Comment: Glucose reference range applies only to samples taken after fasting for at least 8 hours.   Comment 1 Notify RN   Glucose, capillary     Status: Abnormal   Collection Time: 02/08/21  3:58 PM  Result Value Ref Range   Glucose-Capillary 118 (H) 70 - 99 mg/dL    Comment: Glucose reference range applies only to samples taken after fasting for at least 8 hours.  Glucose, capillary     Status: Abnormal   Collection Time: 02/08/21  9:12 PM  Result Value Ref Range   Glucose-Capillary 399 (H) 70 - 99 mg/dL    Comment: Glucose reference range applies only to samples taken after fasting for at least 8 hours.   Comment 1 Notify RN   Comprehensive metabolic panel     Status: Abnormal   Collection Time: 02/09/21  2:40 AM  Result Value Ref Range   Sodium 132 (L) 135 - 145 mmol/L   Potassium 5.1 3.5 - 5.1 mmol/L    Comment: DELTA CHECK NOTED   Chloride 98 98 - 111 mmol/L   CO2 26  22 - 32 mmol/L   Glucose, Bld 327 (H) 70 - 99 mg/dL    Comment: Glucose reference range applies only to samples taken after fasting for at least 8 hours.   BUN 25 (H) 8 - 23 mg/dL   Creatinine, Ser 1.52 (H) 0.61 - 1.24 mg/dL   Calcium 8.4 (L) 8.9 - 10.3 mg/dL   Total Protein 5.6 (L) 6.5 - 8.1 g/dL   Albumin 2.9 (L) 3.5 - 5.0 g/dL   AST  74 (H) 15 - 41 U/L   ALT 194 (H) 0 - 44 U/L   Alkaline Phosphatase 84 38 - 126 U/L   Total Bilirubin 1.9 (H) 0.3 - 1.2 mg/dL   GFR, Estimated 49 (L) >60 mL/min    Comment: (NOTE) Calculated using the CKD-EPI Creatinine Equation (2021)    Anion gap 8 5 - 15    Comment: Performed at [facility], [place] [address], [place], [place] [address]  Glucose, capillary     Status: Abnormal   Collection Time: 02/09/21  8:24 AM  Result Value Ref Range   Glucose-Capillary 236 (H) 70 - 99 mg/dL    Comment: Glucose reference range applies only to samples taken after fasting for at least 8 hours.  Glucose, capillary     Status: Abnormal   Collection Time: 02/09/21 11:05 AM  Result Value Ref Range   Glucose-Capillary 207 (H) 70 - 99 mg/dL    Comment: Glucose reference range applies only to samples taken after fasting for at least 8 hours.  Glucose, capillary     Status: Abnormal   Collection Time: 02/09/21  3:54 PM  Result Value Ref Range   Glucose-Capillary 186 (H) 70 - 99 mg/dL    Comment: Glucose reference range applies only to samples taken after fasting for at least 8 hours.  Glucose, capillary     Status: Abnormal   Collection Time: 02/09/21  6:13 PM  Result Value Ref Range   Glucose-Capillary 246 (H) 70 - 99 mg/dL    Comment: Glucose reference range applies only to samples taken after fasting for at least 8 hours.  Glucose, capillary     Status: Abnormal   Collection Time: 02/09/21  9:08 PM  Result Value Ref Range   Glucose-Capillary 245 (H) 70 - 99 mg/dL    Comment: Glucose reference range applies only to samples taken after fasting for at least 8 hours.  Glucose, capillary     Status: Abnormal   Collection Time: 02/10/21  4:01 AM  Result Value Ref Range   Glucose-Capillary 167 (H) 70 - 99 mg/dL    Comment: Glucose reference range applies only to samples taken after fasting for at least 8 hours.  Glucose, capillary     Status: Abnormal   Collection Time: 02/10/21  8:40 AM  Result  Value Ref Range   Glucose-Capillary 241 (H) 70 - 99 mg/dL    Comment: Glucose reference range applies only to samples taken after fasting for at least 8 hours.   Comment 1 Notify RN    Comment 2 Document in Chart    NM Hepatobiliary Liver Func  Result Date: 02/09/2021 CLINICAL DATA:  Right upper quadrant abdominal pain, nondiagnostic ultrasound EXAM: NUCLEAR MEDICINE HEPATOBILIARY IMAGING TECHNIQUE: Sequential images of the abdomen were obtained out to 60 minutes following intravenous administration of radiopharmaceutical. RADIOPHARMACEUTICALS:  7.3 mCi Tc-77m  Choletec IV COMPARISON:  MR abdomen, 02/08/2021 FINDINGS: Prompt uptake and biliary excretion of activity by the liver is seen. Gallbladder activity is  visualized, consistent with patency of cystic duct. Biliary activity passes into small bowel, consistent with patent common bile duct. IMPRESSION: 1.  Cystic duct is patent. 2.  Common bile duct is patent. Electronically Signed   By: Jearld Lesch M.D.   On: 02/09/2021 14:00   MR 3D Recon At Scanner  Result Date: 02/08/2021 CLINICAL DATA:  Right upper quadrant abdominal pain. Stage 3 chronic kidney disease. Epigastric pain. Diabetes. Indeterminate left renal lesion on recent CT scan EXAM: MRI ABDOMEN WITHOUT AND WITH CONTRAST (INCLUDING MRCP) TECHNIQUE: Multiplanar multisequence MR imaging of the abdomen was performed both before and after the administration of intravenous contrast. Heavily T2-weighted images of the biliary and pancreatic ducts were obtained, and three-dimensional MRCP images were rendered by post processing. CONTRAST:  44mL GADAVIST GADOBUTROL 1 MMOL/ML IV SOLN COMPARISON:  CT abdomen and ultrasound abdomen 02/06/2021 FINDINGS: Lower chest: Dependent reticulation in the lower lobes greater than that shown on recent CT, suspicious for potential dependent interstitial edema and/or subsegmental atelectasis. Hepatobiliary: Diffuse hepatic steatosis. Sludge and small gallstones in the  gallbladder. Mild perihepatic fluid or gallbladder wall thickening along the margin with the liver. No biliary dilatation. No filling defect in the common bile duct or common hepatic duct. Trace nonspecific periportal edema. Diffuse micronodular heterogeneous enhancement of the liver on arterial phase images but subsequent normal hepatic enhancement on portal venous phase and delayed images. Pancreas:  Unremarkable Spleen:  No splenomegaly or focal splenic lesion identified. Adrenals/Urinary Tract:  Both adrenal glands appear normal. Bosniak category 2 cyst of the left kidney lower pole measuring 1.4 by 1.2 by 1.5 cm, with high T1 and low T2 signal density component posteriorly. This lesion does not enhance. Adrenal glands unremarkable. Stomach/Bowel: Bilobed periampullary duodenal diverticulum. No dilated bowel. Vascular/Lymphatic: Atherosclerosis is present, including aortoiliac atherosclerotic disease. There is some atherosclerotic plaque along the origins of the SMA and celiac trunk. Other:  No supplemental non-categorized findings. Musculoskeletal: Unremarkable IMPRESSION: 1. Cholelithiasis with gallstones. Trace perihepatic fluid or gallbladder wall thickening along the margin with the liver. No biliary dilatation. 2. Increased (from 02/06/2021) reticulation and faint enhancement dependently in both lower lobes, query interstitial edema and/or atelectasis. 3. Trace nonspecific periportal edema. 4. Diffuse hepatic steatosis. Mildly heterogeneous arterial phase enhancement in the liver, I would suggest hepatitis panel to exclude the unlikely possibility of hepatitis. Later phase liver imaging appears normal. 5. Bosniak category 2 benign cyst of the left kidney lower pole (benign). 6. Atherosclerosis.  Aortic Atherosclerosis (ICD10-I70.0). Electronically Signed   By: Gaylyn Rong M.D.   On: 02/08/2021 17:26   MR ABDOMEN MRCP W WO CONTAST  Result Date: 02/08/2021 CLINICAL DATA:  Right upper quadrant  abdominal pain. Stage 3 chronic kidney disease. Epigastric pain. Diabetes. Indeterminate left renal lesion on recent CT scan EXAM: MRI ABDOMEN WITHOUT AND WITH CONTRAST (INCLUDING MRCP) TECHNIQUE: Multiplanar multisequence MR imaging of the abdomen was performed both before and after the administration of intravenous contrast. Heavily T2-weighted images of the biliary and pancreatic ducts were obtained, and three-dimensional MRCP images were rendered by post processing. CONTRAST:  34mL GADAVIST GADOBUTROL 1 MMOL/ML IV SOLN COMPARISON:  CT abdomen and ultrasound abdomen 02/06/2021 FINDINGS: Lower chest: Dependent reticulation in the lower lobes greater than that shown on recent CT, suspicious for potential dependent interstitial edema and/or subsegmental atelectasis. Hepatobiliary: Diffuse hepatic steatosis. Sludge and small gallstones in the gallbladder. Mild perihepatic fluid or gallbladder wall thickening along the margin with the liver. No biliary dilatation. No filling defect in the common bile  duct or common hepatic duct. Trace nonspecific periportal edema. Diffuse micronodular heterogeneous enhancement of the liver on arterial phase images but subsequent normal hepatic enhancement on portal venous phase and delayed images. Pancreas:  Unremarkable Spleen:  No splenomegaly or focal splenic lesion identified. Adrenals/Urinary Tract:  Both adrenal glands appear normal. Bosniak category 2 cyst of the left kidney lower pole measuring 1.4 by 1.2 by 1.5 cm, with high T1 and low T2 signal density component posteriorly. This lesion does not enhance. Adrenal glands unremarkable. Stomach/Bowel: Bilobed periampullary duodenal diverticulum. No dilated bowel. Vascular/Lymphatic: Atherosclerosis is present, including aortoiliac atherosclerotic disease. There is some atherosclerotic plaque along the origins of the SMA and celiac trunk. Other:  No supplemental non-categorized findings. Musculoskeletal: Unremarkable IMPRESSION:  1. Cholelithiasis with gallstones. Trace perihepatic fluid or gallbladder wall thickening along the margin with the liver. No biliary dilatation. 2. Increased (from 02/06/2021) reticulation and faint enhancement dependently in both lower lobes, query interstitial edema and/or atelectasis. 3. Trace nonspecific periportal edema. 4. Diffuse hepatic steatosis. Mildly heterogeneous arterial phase enhancement in the liver, I would suggest hepatitis panel to exclude the unlikely possibility of hepatitis. Later phase liver imaging appears normal. 5. Bosniak category 2 benign cyst of the left kidney lower pole (benign). 6. Atherosclerosis.  Aortic Atherosclerosis (ICD10-I70.0). Electronically Signed   By: Van Clines M.D.   On: 02/08/2021 17:26      Assessment/Plan Epigastric pain, cholelithiasis, elevated LFTs -AST 52, ALT 151, T bili 1.8 -Labs and imaging reviewed including abdominal ultrasound, HIDA scan, CT ABD/PELV, MRCP.  Imaging as well as patient description of symptoms are not typical for gallbladder disease and I am not convinced his gallbladder is the source of his pain episodes.  Additionally he is on Plavix and would not recommend surgical intervention at this time.  He can follow-up with Dr. Barry Dienes outpatient if symptoms persist or recur for discussion of possible elective intervention  FEN: Heart ID: None currently VTE: SCDs, Plavix  Winferd Humphrey, [facility] Surgery 02/10/2021, 10:16 AM Please see Amion for pager number during day hours 7:00am-4:30pm

## 2021-02-10 NOTE — Progress Notes (Signed)
No LFT results today (not clear if still in process or not ordered)  Hepatic function panel was improving steadily over last few days. See my note from yesterday afternoon for detailed plan and recommendations.  Dr. Sharl Ma will be calling surgical and hematology consultants today for opinions.  Please get hepatic function panel today and tomorrow. Call if you need me to see this patient in person today for some clinical change.  Otherwise, I will plan to see him tomorrow.  Amada Jupiter, MD

## 2021-02-10 NOTE — Plan of Care (Signed)

## 2021-02-10 NOTE — Progress Notes (Signed)
[facility]  PROGRESS NOTE  [name] Sr. [id number] DOB: 11-02-1950 DOA: 02/06/2021 PCP: Kenneth Chandler, NP   Brief HPI:   70 yr old male with h/o hyperlipidemia, CAD s/p stent, Diabetes mellitus type 2, CKD stage 3, RLS, recent admission for TIA presenting with abdominal pain. He has been having epigastric pain for 3 months. Pain radiates into chest , worsened with exertion. He was seen by cardiology and cleared for any GI procedures. Gastroenterology was consulted, Patient underwent MRCP today under general anesthesia due to continuous lower extremity myoclonic jerks from RLS.   Subjective   Denies abdominal pain.  MRCP only showed cholelithiasis without cholecystitis.  HIDA scan negative.  GI recommends general surgery consultation for biliary colic and hematology consultation for thrombocytopenia.   Assessment/Plan:    Transaminitis/abdominal pain -MRCP only shows cholelithiasis, no biliary rotation -LFTs have improved,?  Passed stone -He was seen by cardiology and cleared for GI procedures -LFTs were significantly elevated; now improved -Ultrasound was negative for gallstones but showed apparent hepatocellular liver disease -CT abdomen pelvis showed cholelithiasis without cholecystitis -HIDA scan unremarkable -Continue IV Protonix -Started on diet yesterday without complication -Started on heart healthy diet -We will consult general surgery for possible biliary colic  Acute kidney injury -Patient's creatinine was 1.81 yesterday; improved to 1.52 today with IV LR -IV fluids were discontinued after patient developed mild interstitial edema --Diovan and Farxiga on hold in setting of renal dysfunction -Follow renal function today  Left renal lesion -1.4 cm left renal lesion seen on the CT scan -MRI renal was recommended, this was added to MRCP done today -MRI only shows benign cyst  CAD -S/p stent placement -Continue Plavix -Dr. Einar Gip as outpatient on  12/5 with initial plan for cath versus stress test, patient preferred stress test -He saw patient yesterday and cleared for GI procedure  Hypertension -Blood pressure is stable -Continue Toprol-XL   Restless leg syndrome -Patient has continuous lower extremity myoclonic jerks -Continue Mirapex as per home schedule -Had to be put under general anesthesia  for MRI of the abdomen  Diabetes mellitus type 2 -Hemoglobin A1c was 7.3  -Farxiga on hold -Continue Lantus, sliding scale insulin NovoLog -CBG elevated fairly well controlled  Hyperlipidemia -Patient reported Lipitor led to worsening of RLS -He is now taking Praluent  Thrombocytopenia -Chronic -Platelet count has been low since 2015 -We will consult hematology for further recommendations  Medications     clopidogrel  75 mg Oral Daily   docusate sodium  100 mg Oral BID   gabapentin  200 mg Oral QHS   insulin aspart  0-15 Units Subcutaneous TID WC   insulin aspart  0-5 Units Subcutaneous QHS   insulin glargine-yfgn  40 Units Subcutaneous Daily   metoprolol succinate  25 mg Oral Daily   pantoprazole (PROTONIX) IV  40 mg Intravenous Q24H   pramipexole  1 mg Oral Daily   pramipexole  1 mg Oral Daily   pramipexole  2 mg Oral QHS     Data Reviewed:   CBG:  Recent Labs  Lab 02/09/21 1554 02/09/21 1813 02/09/21 2108 02/10/21 0401 02/10/21 0840  GLUCAP 186* 246* 245* 167* 241*    SpO2: 97 % O2 Flow Rate (L/min): 4 L/min    Vitals:   02/09/21 2005 02/09/21 2314 02/10/21 0356 02/10/21 0834  BP: 102/80 121/72 140/72 (!) 158/76  Pulse: 74 63 60 (!) 55  Resp: 16 17 18 20   Temp: 98.3 F (36.8 C) 98.2 F (36.8 C) 98.3  F (36.8 C) 98 F (36.7 C)  TempSrc: Oral Oral Oral Oral  SpO2: 95% 94% 92% 97%  Weight:      Height:         Intake/Output Summary (Last 24 hours) at 02/10/2021 0912 Last data filed at 02/10/2021 0357 Gross per 24 hour  Intake 200 ml  Output --  Net 200 ml    12/08 1901 - 12/10  0700 In: 200 [P.O.:200] Out: -   Filed Weights   02/08/21 0951  Weight: 90.7 kg    Data Reviewed: Basic Metabolic Panel: Recent Labs  Lab 02/06/21 1430 02/07/21 0826 02/08/21 0225 02/09/21 0240  NA 135 134* 130* 132*  K 4.7 5.0 4.0 5.1  CL 98 98 101 98  CO2 28 27 21* 26  GLUCOSE 277* 315* 122* 327*  BUN 19 25* 24* 25*  CREATININE 1.48* 1.81* 1.50* 1.52*  CALCIUM 9.8 8.5* 8.4* 8.4*   Liver Function Tests: Recent Labs  Lab 02/06/21 1430 02/07/21 0826 02/08/21 0225 02/09/21 0240  AST 474* 270* 125* 74*  ALT 356* 451* 263* 194*  ALKPHOS 91 88 69 84  BILITOT 3.5* 5.7* 3.6* 1.9*  PROT 7.2 6.4* 5.6* 5.6*  ALBUMIN 4.6 3.4* 2.9* 2.9*   Recent Labs  Lab 02/06/21 1430 02/07/21 1430  LIPASE 24 26  AMYLASE  --  36   No results for input(s): AMMONIA in the last 168 hours. CBC: Recent Labs  Lab 02/06/21 1430 02/07/21 0826 02/08/21 0225  WBC 9.7 9.2 5.5  NEUTROABS  --  8.4*  --   HGB 14.9 14.3 12.5*  HCT 44.1 42.3 37.8*  MCV 87.7 88.9 90.4  PLT 112* 102* 89*   Cardiac Enzymes: No results for input(s): CKTOTAL, CKMB, CKMBINDEX, TROPONINI in the last 168 hours. BNP (last 3 results) Recent Labs    01/03/21 0825  BNP 16.8    ProBNP (last 3 results) No results for input(s): PROBNP in the last 8760 hours.  CBG: Recent Labs  Lab 02/09/21 1554 02/09/21 1813 02/09/21 2108 02/10/21 0401 02/10/21 0840  GLUCAP 186* 246* 245* 167* 241*       Radiology Reports  NM Hepatobiliary Liver Func  Result Date: 02/09/2021 CLINICAL DATA:  Right upper quadrant abdominal pain, nondiagnostic ultrasound EXAM: NUCLEAR MEDICINE HEPATOBILIARY IMAGING TECHNIQUE: Sequential images of the abdomen were obtained out to 60 minutes following intravenous administration of radiopharmaceutical. RADIOPHARMACEUTICALS:  7.3 mCi Tc-60m  Choletec IV COMPARISON:  MR abdomen, 02/08/2021 FINDINGS: Prompt uptake and biliary excretion of activity by the liver is seen. Gallbladder activity is  visualized, consistent with patency of cystic duct. Biliary activity passes into small bowel, consistent with patent common bile duct. IMPRESSION: 1.  Cystic duct is patent. 2.  Common bile duct is patent. Electronically Signed   By: Jearld Lesch M.D.   On: 02/09/2021 14:00   MR 3D Recon At Scanner  Result Date: 02/08/2021 CLINICAL DATA:  Right upper quadrant abdominal pain. Stage 3 chronic kidney disease. Epigastric pain. Diabetes. Indeterminate left renal lesion on recent CT scan EXAM: MRI ABDOMEN WITHOUT AND WITH CONTRAST (INCLUDING MRCP) TECHNIQUE: Multiplanar multisequence MR imaging of the abdomen was performed both before and after the administration of intravenous contrast. Heavily T2-weighted images of the biliary and pancreatic ducts were obtained, and three-dimensional MRCP images were rendered by post processing. CONTRAST:  84mL GADAVIST GADOBUTROL 1 MMOL/ML IV SOLN COMPARISON:  CT abdomen and ultrasound abdomen 02/06/2021 FINDINGS: Lower chest: Dependent reticulation in the lower lobes greater than that shown on recent  CT, suspicious for potential dependent interstitial edema and/or subsegmental atelectasis. Hepatobiliary: Diffuse hepatic steatosis. Sludge and small gallstones in the gallbladder. Mild perihepatic fluid or gallbladder wall thickening along the margin with the liver. No biliary dilatation. No filling defect in the common bile duct or common hepatic duct. Trace nonspecific periportal edema. Diffuse micronodular heterogeneous enhancement of the liver on arterial phase images but subsequent normal hepatic enhancement on portal venous phase and delayed images. Pancreas:  Unremarkable Spleen:  No splenomegaly or focal splenic lesion identified. Adrenals/Urinary Tract:  Both adrenal glands appear normal. Bosniak category 2 cyst of the left kidney lower pole measuring 1.4 by 1.2 by 1.5 cm, with high T1 and low T2 signal density component posteriorly. This lesion does not enhance. Adrenal  glands unremarkable. Stomach/Bowel: Bilobed periampullary duodenal diverticulum. No dilated bowel. Vascular/Lymphatic: Atherosclerosis is present, including aortoiliac atherosclerotic disease. There is some atherosclerotic plaque along the origins of the SMA and celiac trunk. Other:  No supplemental non-categorized findings. Musculoskeletal: Unremarkable IMPRESSION: 1. Cholelithiasis with gallstones. Trace perihepatic fluid or gallbladder wall thickening along the margin with the liver. No biliary dilatation. 2. Increased (from 02/06/2021) reticulation and faint enhancement dependently in both lower lobes, query interstitial edema and/or atelectasis. 3. Trace nonspecific periportal edema. 4. Diffuse hepatic steatosis. Mildly heterogeneous arterial phase enhancement in the liver, I would suggest hepatitis panel to exclude the unlikely possibility of hepatitis. Later phase liver imaging appears normal. 5. Bosniak category 2 benign cyst of the left kidney lower pole (benign). 6. Atherosclerosis.  Aortic Atherosclerosis (ICD10-I70.0). Electronically Signed   By: Van Clines M.D.   On: 02/08/2021 17:26   MR ABDOMEN MRCP W WO CONTAST  Result Date: 02/08/2021 CLINICAL DATA:  Right upper quadrant abdominal pain. Stage 3 chronic kidney disease. Epigastric pain. Diabetes. Indeterminate left renal lesion on recent CT scan EXAM: MRI ABDOMEN WITHOUT AND WITH CONTRAST (INCLUDING MRCP) TECHNIQUE: Multiplanar multisequence MR imaging of the abdomen was performed both before and after the administration of intravenous contrast. Heavily T2-weighted images of the biliary and pancreatic ducts were obtained, and three-dimensional MRCP images were rendered by post processing. CONTRAST:  67mL GADAVIST GADOBUTROL 1 MMOL/ML IV SOLN COMPARISON:  CT abdomen and ultrasound abdomen 02/06/2021 FINDINGS: Lower chest: Dependent reticulation in the lower lobes greater than that shown on recent CT, suspicious for potential dependent  interstitial edema and/or subsegmental atelectasis. Hepatobiliary: Diffuse hepatic steatosis. Sludge and small gallstones in the gallbladder. Mild perihepatic fluid or gallbladder wall thickening along the margin with the liver. No biliary dilatation. No filling defect in the common bile duct or common hepatic duct. Trace nonspecific periportal edema. Diffuse micronodular heterogeneous enhancement of the liver on arterial phase images but subsequent normal hepatic enhancement on portal venous phase and delayed images. Pancreas:  Unremarkable Spleen:  No splenomegaly or focal splenic lesion identified. Adrenals/Urinary Tract:  Both adrenal glands appear normal. Bosniak category 2 cyst of the left kidney lower pole measuring 1.4 by 1.2 by 1.5 cm, with high T1 and low T2 signal density component posteriorly. This lesion does not enhance. Adrenal glands unremarkable. Stomach/Bowel: Bilobed periampullary duodenal diverticulum. No dilated bowel. Vascular/Lymphatic: Atherosclerosis is present, including aortoiliac atherosclerotic disease. There is some atherosclerotic plaque along the origins of the SMA and celiac trunk. Other:  No supplemental non-categorized findings. Musculoskeletal: Unremarkable IMPRESSION: 1. Cholelithiasis with gallstones. Trace perihepatic fluid or gallbladder wall thickening along the margin with the liver. No biliary dilatation. 2. Increased (from 02/06/2021) reticulation and faint enhancement dependently in both lower lobes, query interstitial edema  and/or atelectasis. 3. Trace nonspecific periportal edema. 4. Diffuse hepatic steatosis. Mildly heterogeneous arterial phase enhancement in the liver, I would suggest hepatitis panel to exclude the unlikely possibility of hepatitis. Later phase liver imaging appears normal. 5. Bosniak category 2 benign cyst of the left kidney lower pole (benign). 6. Atherosclerosis.  Aortic Atherosclerosis (ICD10-I70.0). Electronically Signed   By: Van Clines  M.D.   On: 02/08/2021 17:26       Antibiotics: Anti-infectives (From admission, onward)    Start     Dose/Rate Route Frequency Ordered Stop   02/06/21 2145  cefTRIAXone (ROCEPHIN) 2 g in sodium chloride 0.9 % 100 mL IVPB        2 g 200 mL/hr over 30 Minutes Intravenous  Once 02/06/21 2132 02/06/21 2246         DVT prophylaxis: SCDs  Code Status: Full code  Family Communication: No family at bedside   Consultants: Gastroenterology  Procedures: Intubation for MRI    Objective    Physical Examination:  General-appears in no acute distress Heart-S1-S2, regular, no murmur auscultated Lungs-clear to auscultation bilaterally, no wheezing or crackles auscultated Abdomen-soft, nontender, no organomegaly Extremities-no edema in the lower extremities Neuro-alert, oriented x3, no focal deficit noted  Status is: Inpatient  Dispo: The patient is from: Home              Anticipated d/c is to: Home              Anticipated d/c date is: 02/11/2021              Patient currently not stable for discharge  Barrier to discharge-ongoing evaluation for transaminitis  COVID-19 Labs  No results for input(s): DDIMER, FERRITIN, LDH, CRP in the last 72 hours.  Lab Results  Component Value Date   SARSCOV2NAA NEGATIVE 02/06/2021   [place] NEGATIVE 01/14/2021   SARSCOV2NAA NOT DETECTED 10/04/2020   SARSCOV2NAA NOT DETECTED 08/01/2020            Recent Results (from the past 240 hour(s))  Resp Panel by RT-PCR (Flu A&B, Covid) Nasopharyngeal Swab     Status: None   Collection Time: 02/06/21  2:30 PM   Specimen: Nasopharyngeal Swab; Nasopharyngeal(NP) swabs in vial transport medium  Result Value Ref Range Status   SARS Coronavirus 2 by RT PCR NEGATIVE NEGATIVE Final    Comment: (NOTE) SARS-CoV-2 target nucleic acids are NOT DETECTED.  The SARS-CoV-2 RNA is generally detectable in upper respiratory specimens during the acute phase of infection. The  lowest concentration of SARS-CoV-2 viral copies this assay can detect is 138 copies/mL. A negative result does not preclude SARS-Cov-2 infection and should not be used as the sole basis for treatment or other patient management decisions. A negative result may occur with  improper specimen collection/handling, submission of specimen other than nasopharyngeal swab, presence of viral mutation(s) within the areas targeted by this assay, and inadequate number of viral copies(<138 copies/mL). A negative result must be combined with clinical observations, patient history, and epidemiological information. The expected result is Negative.  Fact Sheet for Patients:  EntrepreneurPulse.com.au  Fact Sheet for Healthcare Providers:  IncredibleEmployment.be  This test is no t yet approved or cleared by the [place] FDA and  has been authorized for detection and/or diagnosis of SARS-CoV-2 by FDA under an Emergency Use Authorization (EUA). This EUA will remain  in effect (meaning this test can be used) for the duration of the COVID-19 declaration under Section 564(b)(1) of the Act, 21 U.S.C.section 360bbb-3(b)(1),  unless the authorization is terminated  or revoked sooner.       Influenza A by PCR NEGATIVE NEGATIVE Final   Influenza B by PCR NEGATIVE NEGATIVE Final    Comment: (NOTE) The Xpert Xpress SARS-CoV-2/FLU/RSV plus assay is intended as an aid in the diagnosis of influenza from Nasopharyngeal swab specimens and should not be used as a sole basis for treatment. Nasal washings and aspirates are unacceptable for Xpert Xpress SARS-CoV-2/FLU/RSV testing.  Fact Sheet for Patients: EntrepreneurPulse.com.au  Fact Sheet for Healthcare Providers: IncredibleEmployment.be  This test is not yet approved or cleared by the [place] FDA and has been authorized for detection and/or diagnosis of SARS-CoV-2 by FDA under  an Emergency Use Authorization (EUA). This EUA will remain in effect (meaning this test can be used) for the duration of the COVID-19 declaration under Section 564(b)(1) of the Act, 21 U.S.C. section 360bbb-3(b)(1), unless the authorization is terminated or revoked.  Performed at [facility], [address] [place], [place] [address]   MRSA Next Gen by PCR, Nasal     Status: None   Collection Time: 02/07/21  6:10 AM   Specimen: Nasal Mucosa; Nasal Swab  Result Value Ref Range Status   MRSA by PCR Next Gen NOT DETECTED NOT DETECTED Final    Comment: (NOTE) The GeneXpert MRSA Assay (FDA approved for NASAL specimens only), is one component of a comprehensive MRSA colonization surveillance program. It is not intended to diagnose MRSA infection nor to guide or monitor treatment for MRSA infections. Test performance is not FDA approved in patients less than 79 years old. Performed at [facility], [place] [address], [place], [place] [address]     [facility] If 7PM-7AM, please contact night-coverage at [email or web address], Office  [phone]   02/10/2021, 9:12 AM  LOS: 3 days

## 2021-02-10 NOTE — Consult Note (Signed)
[place] [phone]   [phone]  CONSULT NOTE  REFERRING PHYSICIAN: Dr. Eleonore Chiquito  REASON FOR CONSULTATION:  70 years old white male with thrombocytopenia  HPI Kenneth Baab Sr. is a 70 y.o. male with past medical history significant for osteoarthritis, coronary artery disease, chronic bronchitis, hypertension, dyslipidemia, diabetes mellitus, iron deficiency anemia, hypothyroidism, history of stroke, unstable angina pectoris as well as Barrett's esophagus.  The patient was admitted to the hospital on February 06, 2021 complaining of abdominal pain.  During his evaluation he had CT scan of the abdomen pelvis on February 06, 2021 and that showed cholelithiasis with no acute findings of acute cholecystitis and the patient has possible mild hepatic steatosis and indeterminant 1.4 cm left renal lesion.  This was followed by MRI of the abdomen pelvis on February 08, 2021 and that showed cholelithiasis with gallstones with trace perihepatic fluid or gallbladder wall thickening along the margin with the liver and no biliary dilatation.  There was also increased reticulation and faint enhancing dependently in both lower lobes query for interstitial edema and/or atelectasis with trace nonspecific periportal edema and diffuse hepatic steatosis.  His blood work including CBC on admission showed low platelets count of 112,000.  He had extensive work-up for hepatitis that was unremarkable.  His platelet count has been declining over the last few days during his admission and today was down to 89,000.  I was consulted to evaluate the patient regarding his low platelet count. Reviewing his previous records the patient had mild thrombocytopenia that has been going on for long time.  For example on April 27, 2019 his platelets count were low at 56,000 which later improved again to over 100,000.  Repeat CBC today showed improvement of his platelets count to close the baseline of  1 28,000. Family history is unknown since the patient is adopted. The patient is married and has 2 children.  He works as a Chiropractor.  He has no history of smoking, alcohol or drug abuse.  HPI  Past Medical History:  Diagnosis Date   Arthritis    "left thumb" (05/23/2017) right shoulder   Barrett's esophagus    "we've been told that it's all gone; still take RX for GERD" (05/23/2017)   Bilateral swelling of feet and ankles x 3 weeks as of 12-01-2019   CAD (coronary artery disease), native coronary artery    Coronary angiogram 05/03/2014:  Proximal LAD 3.0x12 mm Promus premier stent.  04/08/2014: Mid Cx 3.5 x 16 mm Promus DES.  03/29/2013: Mid LAD 2.75 x 38 mm Promus Premier drug-eluting stent, balloon angioplasty of D1 and distal LAD and stenting of distal RCA with 2.75 x 24 mm promos Premier drug-eluting stent 12/15/2012 widely patent.   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  04/08/2014: Mid Cx 3.5 x 16 mm Promus DES.  03/29/2013: Mid LAD 2.75 x 38 mm Promus Premier drug-eluting stent, balloon angioplasty of D1 and distal LAD and stenting of distal RCA with 2.75 x 24 mm promos Premier drug-eluting stent 12/15/2012 widely patent.    Past Surgical History:  Procedure Laterality Date   BICEPT TENODESIS Right 12/07/2019   Procedure: RIGHT BICEPS TENODESIS;  Surgeon: Renette Butters, MD;  Location: [facility];  Service: Orthopedics;  Laterality: Right;   CARDIAC CATHETERIZATION N/A 09/25/2015   Procedure: Left Heart Cath and Coronary Angiography;  Surgeon: Adrian Prows, MD;  Location: [place] CV LAB;  Service: Cardiovascular;  Laterality: N/A;   CARDIAC CATHETERIZATION N/A 09/25/2015   Procedure: Intravascular Pressure Wire/FFR Study;  Surgeon: Adrian Prows, MD;  Location: [place] CV LAB;  Service: Cardiovascular;  Laterality: N/A;   CORONARY ANGIOPLASTY WITH STENT PLACEMENT  12/15/2012; 04/28/2014; 05/03/2014   "2; 1; 1" , 4 stents 2 balloons   FRACTIONAL FLOW RESERVE WIRE Right 03/26/2013   Procedure: FRACTIONAL FLOW RESERVE WIRE;  Surgeon: Laverda Page, MD;  Location[facility] CATH LAB;  Service: Cardiovascular;  Laterality: Right;   FRACTIONAL FLOW RESERVE WIRE N/A 05/03/2014   Procedure: FRACTIONAL FLOW RESERVE WIRE;  Surgeon: Laverda Page, MD;  Location: [facility] CATH LAB;  Service: Cardiovascular;  Laterality: N/A;   HERNIA REPAIR  [id number]   "umbilical"    INCISION AND DRAINAGE ABSCESS Left 05/2007   "groin"    KNEE SURGERY Right 1992;  2000   "calcium deposits removed" (12/15/2012)   LEFT HEART CATH AND CORONARY ANGIOGRAPHY N/A 05/23/2017   Procedure: LEFT HEART CATH AND CORONARY ANGIOGRAPHY;  Surgeon: Adrian Prows, MD;  Location: [place] CV LAB;  Service: Cardiovascular;  Laterality: N/A;   LEFT HEART CATHETERIZATION WITH CORONARY ANGIOGRAM N/A 12/15/2012   Procedure: LEFT HEART CATHETERIZATION WITH CORONARY ANGIOGRAM;  Surgeon: Laverda Page, MD;  Location: [facility] CATH LAB;  Service: Cardiovascular;  Laterality: N/A;   LEFT HEART CATHETERIZATION WITH CORONARY ANGIOGRAM N/A 03/26/2013   Procedure: LEFT HEART CATHETERIZATION WITH CORONARY ANGIOGRAM;  Surgeon: Laverda Page, MD;  Location: [facility] CATH LAB;  Service: Cardiovascular;  Laterality: N/A;   LEFT HEART CATHETERIZATION WITH CORONARY ANGIOGRAM N/A 04/08/2014   Procedure: LEFT HEART CATHETERIZATION WITH CORONARY ANGIOGRAM;  Surgeon: Blane Ohara, MD;  Location: [facility] CATH LAB;  Service: Cardiovascular;  Laterality: N/A;   PERCUTANEOUS CORONARY STENT INTERVENTION (PCI-S) N/A 05/03/2014   Procedure: PERCUTANEOUS CORONARY STENT INTERVENTION (PCI-S);  Surgeon: Laverda Page, MD;  Location: [facility] CATH LAB;  Service: Cardiovascular;  Laterality: N/A;   RADIOLOGY WITH ANESTHESIA N/A 02/08/2021   Procedure: MRI WITH ANESTHESIA OF ABDOMEN WITH AND WITHOUT CONSTRAST;  Surgeon: Radiologist, Medication, MD;  Location: [place];  Service: Radiology;  Laterality: N/A;   ROTATOR CUFF REPAIR     UMBILICAL HERNIA REPAIR  yrs ago  Family History  Adopted: Yes  Problem Relation Age of Onset   Emphysema Mother     Social History Social History   Tobacco Use   Smoking status: Never   Smokeless tobacco: Never  Vaping Use   Vaping Use: Never used  Substance Use Topics   Alcohol use: No   Drug use: No    Allergies  Allergen Reactions   Lyrica [Pregabalin] Other (See Comments)    Made patient very lethargic the next morning, very hard to patient to function, move, etc.    Statins Other (See Comments)    Causes Restless Legs, rhabdomyolysis   Ativan [Lorazepam] Other (See Comments)    Markedly increased restless legs   Trulicity [Dulaglutide] Other (See Comments)    GI side effects     Current Facility-Administered Medications  Medication Dose Route Frequency Provider Last Rate Last Admin   acetaminophen (TYLENOL) tablet 650 mg  650 mg Oral Q6H PRN Karmen Bongo, MD   650 mg at  02/07/21 2121   Or   acetaminophen (TYLENOL) suppository 650 mg  650 mg Rectal Q6H PRN Karmen Bongo, MD       albuterol (PROVENTIL) (2.5 MG/3ML) 0.083% nebulizer solution 3 mL  3 mL Inhalation Q6H PRN Karmen Bongo, MD       bisacodyl (DULCOLAX) EC tablet 5 mg  5 mg Oral Daily PRN Karmen Bongo, MD       clopidogrel (PLAVIX) tablet 75 mg  75 mg Oral Daily Karmen Bongo, MD   75 mg at 02/10/21 [id number]   docusate sodium (COLACE) capsule 100 mg  100 mg Oral BID Karmen Bongo, MD   100 mg at 02/10/21 [id number]   gabapentin (NEURONTIN) capsule 200 mg  200 mg Oral Ivery Quale, MD   200 mg at 02/09/21 2211   hydrALAZINE (APRESOLINE) injection 5 mg  5 mg Intravenous Q4H PRN Karmen Bongo, MD       insulin aspart (novoLOG) injection 0-15 Units  0-15 Units Subcutaneous TID WC Karmen Bongo, MD   5 Units at 02/10/21 [id number]   insulin aspart (novoLOG) injection 0-5 Units  0-5 Units Subcutaneous QHS Karmen Bongo, MD   2 Units at 02/09/21 2210   insulin glargine-yfgn (SEMGLEE) injection 40 Units  40 Units Subcutaneous Daily Karmen Bongo, MD   40 Units at 02/10/21 0909   metoprolol succinate (TOPROL-XL) 24 hr tablet 25 mg  25 mg Oral Daily Karmen Bongo, MD   25 mg at 02/10/21 [id number]   morphine 2 MG/ML injection 2 mg  2 mg Intravenous Q2H PRN Karmen Bongo, MD   2 mg at 02/08/21 0935   ondansetron (ZOFRAN) tablet 4 mg  4 mg Oral Q6H PRN Karmen Bongo, MD       Or   ondansetron [facility]) injection 4 mg  4 mg Intravenous Q6H PRN Karmen Bongo, MD       oxyCODONE (Oxy IR/ROXICODONE) immediate release tablet 5 mg  5 mg Oral Q4H PRN Karmen Bongo, MD   5 mg at 02/08/21 2304   pantoprazole (PROTONIX) injection 40 mg  40 mg Intravenous Q24H Karmen Bongo, MD   40 mg at 02/09/21 1003   polyethylene glycol (MIRALAX / GLYCOLAX) packet 17 g  17 g Oral Daily PRN Karmen Bongo, MD       pramipexole (MIRAPEX) tablet 1 mg  1 mg Oral Daily Karmen Bongo, MD   1 mg at 02/09/21 1828   pramipexole  (MIRAPEX) tablet 1 mg  1 mg Oral Daily Karmen Bongo, MD  1 mg at 02/09/21 2002   pramipexole (MIRAPEX) tablet 2 mg  2 mg Oral Ivery Quale, MD   2 mg at 02/09/21 2211    Review of Systems  Constitutional: negative Eyes: negative Ears, nose, mouth, throat, and face: negative Respiratory: negative Cardiovascular: negative Gastrointestinal: negative Genitourinary:negative Integument/breast: negative Hematologic/lymphatic: negative Musculoskeletal:negative Neurological: negative Behavioral/Psych: negative Endocrine: negative Allergic/Immunologic: negative  Physical Exam  [id number], healthy, no distress, well nourished, and well developed SKIN: skin color, texture, turgor are normal, no rashes or significant lesions HEAD: Normocephalic, No masses, lesions, tenderness or abnormalities EYES: normal, PERRLA, Conjunctiva are pink and non-injected EARS: External ears normal, Canals clear OROPHARYNX:no exudate, no erythema, and lips, buccal mucosa, and tongue normal  NECK: supple, no adenopathy, no JVD LYMPH:  no palpable lymphadenopathy, no hepatosplenomegaly BREAST:not examined LUNGS: clear to auscultation , and palpation HEART: regular rate & rhythm, no murmurs, and no gallops ABDOMEN:abdomen soft, non-tender, normal bowel sounds, and no masses or organomegaly BACK: Back symmetric, no curvature., No CVA tenderness EXTREMITIES:no joint deformities, effusion, or inflammation, no edema  NEURO: alert & oriented x 3 with fluent speech, no focal motor/sensory deficits  PERFORMANCE STATUS: ECOG 1  LABORATORY DATA: Lab Results  Component Value Date   WBC 5.5 02/08/2021   HGB 12.5 (L) 02/08/2021   HCT 37.8 (L) 02/08/2021   MCV 90.4 02/08/2021   PLT 89 (L) 02/08/2021    @LASTCHEM @  RADIOGRAPHIC STUDIES: DG Chest 2 View  Result Date: 02/01/2021 CLINICAL DATA:  Chest pain. EXAM: CHEST - 2 VIEW COMPARISON:  Chest x-ray 05/04/2019. FINDINGS: The heart size and mediastinal  contours are within normal limits. Both lungs are clear. The visualized skeletal structures are unremarkable. IMPRESSION: No active cardiopulmonary disease. Electronically Signed   By: Ronney Asters M.D.   On: 02/01/2021 19:07   DG Chest 2 View  Result Date: 01/31/2021 CLINICAL DATA:  Chest pain mild shortness of breath EXAM: CHEST - 2 VIEW COMPARISON:  Chest radiograph 05/04/2019 FINDINGS: The cardiomediastinal silhouette is normal. There is no focal consolidation or pulmonary edema. There is no pleural effusion or pneumothorax. There is no acute osseous abnormality. IMPRESSION: No radiographic evidence of acute cardiopulmonary process. Electronically Signed   By: Valetta Mole M.D.   On: 01/31/2021 10:10   CT HEAD WO CONTRAST  Result Date: 01/14/2021 CLINICAL DATA:  Neuro deficit, acute, stroke suspected EXAM: CT HEAD WITHOUT CONTRAST TECHNIQUE: Contiguous axial images were obtained from the base of the skull through the vertex without intravenous contrast. COMPARISON:  None. FINDINGS: Brain: No evidence of acute infarction, hemorrhage, hydrocephalus, extra-axial collection or mass lesion/mass effect. Vascular: Vascular calcifications of the carotid siphons. Skull: Normal. Negative for fracture or focal lesion. Sinuses/Orbits: No acute finding. Other: None. IMPRESSION: No acute intracranial abnormality. Electronically Signed   By: Valentino Saxon M.D.   On: 01/14/2021 13:00   MR ANGIO HEAD WO CONTRAST  Result Date: 01/14/2021 CLINICAL DATA:  Initial evaluation for acute TIA, left-sided facial droop with blurry vision. EXAM: MRI HEAD WITHOUT CONTRAST MRA HEAD WITHOUT CONTRAST MRA NECK WITHOUT CONTRAST TECHNIQUE: Multiplanar, multiecho pulse sequences of the brain and surrounding structures were obtained without intravenous contrast. Angiographic images of the Circle of Willis were obtained using MRA technique without intravenous contrast. Angiographic images of the neck were obtained using MRA  technique without intravenous contrast. Carotid stenosis measurements (when applicable) are obtained utilizing NASCET criteria, using the distal internal carotid diameter as the denominator. COMPARISON:  CT from earlier the same day. FINDINGS: MRI HEAD  FINDINGS Brain: Cerebral volume within normal limits for age. No focal parenchymal signal abnormality or significant cerebral white matter disease. No abnormal foci of restricted diffusion to suggest acute or subacute ischemia. Gray-white matter differentiation maintained. No encephalomalacia to suggest chronic cortical infarction. No acute intracranial hemorrhage. Single punctate chronic microhemorrhage noted at the anterior right frontal lobe of doubtful significance in isolation. No mass lesion, midline shift or mass effect no hydrocephalus or extra-axial fluid collection. Pituitary gland suprasellar region within normal limits. Midline structures intact. Vascular: Major intracranial vascular flow voids are maintained. Skull and upper cervical spine: Craniocervical junction within normal limits. Bone marrow signal intensity normal. No scalp soft tissue abnormality. Sinuses/Orbits: Globes and orbital soft tissues within normal limits. Paranasal sinuses are clear. No significant mastoid effusion. Inner ear structures grossly normal. Other: None. MRA HEAD FINDINGS ANTERIOR CIRCULATION: Visualized distal cervical segments of the internal carotid arteries are patent with antegrade flow. Petrous, cavernous, and supraclinoid segments patent without stenosis or other abnormality. A1 segments patent bilaterally. Right A1 hypoplastic. Normal anterior communicating artery complex. Anterior cerebral arteries patent their distal aspects without stenosis. No M1 stenosis or occlusion. Normal MCA bifurcations. Distal MCA branches perfused and symmetric. POSTERIOR CIRCULATION: Left vertebral artery dominant and widely patent to the vertebrobasilar junction. Right vertebral artery  hypoplastic and patent as well. Both PICA origins patent and normal. Basilar patent to its distal aspect without stenosis. Superior cerebellar arteries patent bilaterally. Left PCA supplied via the basilar. Fetal type origin of the right PCA. Both PCAs well perfused to their distal aspects. No intracranial aneurysm. MRA NECK FINDINGS AORTIC ARCH: Examination somewhat technically limited by lack of IV contrast and motion artifact. Visualized aortic arch normal in caliber with normal 3 vessel morphology. No stenosis about the origin the great vessels. RIGHT CAROTID SYSTEM: Visualized right CCA patent without stenosis. No significant atheromatous stenosis about the right carotid bulb. Visualized right ICA patent distally without stenosis, evidence for dissection or occlusion. LEFT CAROTID SYSTEM: Visualized left CCA patent without visible stenosis. No significant atheromatous stenosis about the left carotid bulb. Visualized left ICA patent distally without evidence for dissection or stenosis. VERTEBRAL ARTERIES: Both vertebral arteries appear to arise from the subclavian arteries. Left vertebral artery strongly dominant and widely patent within the neck without dissection or stenosis. Right vertebral artery hypoplastic, seen on corresponding MRA head portion of this exam. The right vertebral artery is not visualized within the neck, which could be occluded. IMPRESSION: MRI HEAD: Normal brain MRI for age. No acute intracranial infarct or other abnormality identified. MRA HEAD: Normal intracranial MRA. No large vessel occlusion or hemodynamically significant stenosis. No aneurysm. MRA NECK: 1. 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 The distal right V4 segment is patent on corresponding MRA head portion of this exam with perfusion of the right PICA. 2. Widely patent dominant left vertebral artery. 3. Wide patency of both carotid artery systems within the neck.  Electronically Signed   By: Jeannine Boga M.D.   On: 01/14/2021 19:19   MR ANGIO NECK WO CONTRAST  Result Date: 01/14/2021 CLINICAL DATA:  Initial evaluation for acute TIA, left-sided facial droop with blurry vision. EXAM: MRI HEAD WITHOUT CONTRAST MRA HEAD WITHOUT CONTRAST MRA NECK WITHOUT CONTRAST TECHNIQUE: Multiplanar, multiecho pulse sequences of the brain and surrounding structures were obtained without intravenous contrast. Angiographic images of the Circle of Willis were obtained using MRA technique without intravenous contrast. Angiographic images of the neck were obtained using MRA  technique without intravenous contrast. Carotid stenosis measurements (when applicable) are obtained utilizing NASCET criteria, using the distal internal carotid diameter as the denominator. COMPARISON:  CT from earlier the same day. FINDINGS: MRI HEAD FINDINGS Brain: Cerebral volume within normal limits for age. No focal parenchymal signal abnormality or significant cerebral white matter disease. No abnormal foci of restricted diffusion to suggest acute or subacute ischemia. Gray-white matter differentiation maintained. No encephalomalacia to suggest chronic cortical infarction. No acute intracranial hemorrhage. Single punctate chronic microhemorrhage noted at the anterior right frontal lobe of doubtful significance in isolation. No mass lesion, midline shift or mass effect no hydrocephalus or extra-axial fluid collection. Pituitary gland suprasellar region within normal limits. Midline structures intact. Vascular: Major intracranial vascular flow voids are maintained. Skull and upper cervical spine: Craniocervical junction within normal limits. Bone marrow signal intensity normal. No scalp soft tissue abnormality. Sinuses/Orbits: Globes and orbital soft tissues within normal limits. Paranasal sinuses are clear. No significant mastoid effusion. Inner ear structures grossly normal. Other: None. MRA HEAD FINDINGS  ANTERIOR CIRCULATION: Visualized distal cervical segments of the internal carotid arteries are patent with antegrade flow. Petrous, cavernous, and supraclinoid segments patent without stenosis or other abnormality. A1 segments patent bilaterally. Right A1 hypoplastic. Normal anterior communicating artery complex. Anterior cerebral arteries patent their distal aspects without stenosis. No M1 stenosis or occlusion. Normal MCA bifurcations. Distal MCA branches perfused and symmetric. POSTERIOR CIRCULATION: Left vertebral artery dominant and widely patent to the vertebrobasilar junction. Right vertebral artery hypoplastic and patent as well. Both PICA origins patent and normal. Basilar patent to its distal aspect without stenosis. Superior cerebellar arteries patent bilaterally. Left PCA supplied via the basilar. Fetal type origin of the right PCA. Both PCAs well perfused to their distal aspects. No intracranial aneurysm. MRA NECK FINDINGS AORTIC ARCH: Examination somewhat technically limited by lack of IV contrast and motion artifact. Visualized aortic arch normal in caliber with normal 3 vessel morphology. No stenosis about the origin the great vessels. RIGHT CAROTID SYSTEM: Visualized right CCA patent without stenosis. No significant atheromatous stenosis about the right carotid bulb. Visualized right ICA patent distally without stenosis, evidence for dissection or occlusion. LEFT CAROTID SYSTEM: Visualized left CCA patent without visible stenosis. No significant atheromatous stenosis about the left carotid bulb. Visualized left ICA patent distally without evidence for dissection or stenosis. VERTEBRAL ARTERIES: Both vertebral arteries appear to arise from the subclavian arteries. Left vertebral artery strongly dominant and widely patent within the neck without dissection or stenosis. Right vertebral artery hypoplastic, seen on corresponding MRA head portion of this exam. The right vertebral artery is not visualized  within the neck, which could be occluded. IMPRESSION: MRI HEAD: Normal brain MRI for age. No acute intracranial infarct or other abnormality identified. MRA HEAD: Normal intracranial MRA. No large vessel occlusion or hemodynamically significant stenosis. No aneurysm. MRA NECK: 1. 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 The distal right V4 segment is patent on corresponding MRA head portion of this exam with perfusion of the right PICA. 2. Widely patent dominant left vertebral artery. 3. Wide patency of both carotid artery systems within the neck. Electronically Signed   By: Jeannine Boga M.D.   On: 01/14/2021 19:19   MR BRAIN WO CONTRAST  Result Date: 01/14/2021 CLINICAL DATA:  Initial evaluation for acute TIA, left-sided facial droop with blurry vision. EXAM: MRI HEAD WITHOUT CONTRAST MRA HEAD WITHOUT CONTRAST MRA NECK WITHOUT CONTRAST TECHNIQUE: Multiplanar, multiecho pulse sequences of the  brain and surrounding structures were obtained without intravenous contrast. Angiographic images of the Circle of Willis were obtained using MRA technique without intravenous contrast. Angiographic images of the neck were obtained using MRA technique without intravenous contrast. Carotid stenosis measurements (when applicable) are obtained utilizing NASCET criteria, using the distal internal carotid diameter as the denominator. COMPARISON:  CT from earlier the same day. FINDINGS: MRI HEAD FINDINGS Brain: Cerebral volume within normal limits for age. No focal parenchymal signal abnormality or significant cerebral white matter disease. No abnormal foci of restricted diffusion to suggest acute or subacute ischemia. Gray-white matter differentiation maintained. No encephalomalacia to suggest chronic cortical infarction. No acute intracranial hemorrhage. Single punctate chronic microhemorrhage noted at the anterior right frontal lobe of doubtful significance in  isolation. No mass lesion, midline shift or mass effect no hydrocephalus or extra-axial fluid collection. Pituitary gland suprasellar region within normal limits. Midline structures intact. Vascular: Major intracranial vascular flow voids are maintained. Skull and upper cervical spine: Craniocervical junction within normal limits. Bone marrow signal intensity normal. No scalp soft tissue abnormality. Sinuses/Orbits: Globes and orbital soft tissues within normal limits. Paranasal sinuses are clear. No significant mastoid effusion. Inner ear structures grossly normal. Other: None. MRA HEAD FINDINGS ANTERIOR CIRCULATION: Visualized distal cervical segments of the internal carotid arteries are patent with antegrade flow. Petrous, cavernous, and supraclinoid segments patent without stenosis or other abnormality. A1 segments patent bilaterally. Right A1 hypoplastic. Normal anterior communicating artery complex. Anterior cerebral arteries patent their distal aspects without stenosis. No M1 stenosis or occlusion. Normal MCA bifurcations. Distal MCA branches perfused and symmetric. POSTERIOR CIRCULATION: Left vertebral artery dominant and widely patent to the vertebrobasilar junction. Right vertebral artery hypoplastic and patent as well. Both PICA origins patent and normal. Basilar patent to its distal aspect without stenosis. Superior cerebellar arteries patent bilaterally. Left PCA supplied via the basilar. Fetal type origin of the right PCA. Both PCAs well perfused to their distal aspects. No intracranial aneurysm. MRA NECK FINDINGS AORTIC ARCH: Examination somewhat technically limited by lack of IV contrast and motion artifact. Visualized aortic arch normal in caliber with normal 3 vessel morphology. No stenosis about the origin the great vessels. RIGHT CAROTID SYSTEM: Visualized right CCA patent without stenosis. No significant atheromatous stenosis about the right carotid bulb. Visualized right ICA patent distally  without stenosis, evidence for dissection or occlusion. LEFT CAROTID SYSTEM: Visualized left CCA patent without visible stenosis. No significant atheromatous stenosis about the left carotid bulb. Visualized left ICA patent distally without evidence for dissection or stenosis. VERTEBRAL ARTERIES: Both vertebral arteries appear to arise from the subclavian arteries. Left vertebral artery strongly dominant and widely patent within the neck without dissection or stenosis. Right vertebral artery hypoplastic, seen on corresponding MRA head portion of this exam. The right vertebral artery is not visualized within the neck, which could be occluded. IMPRESSION: MRI HEAD: Normal brain MRI for age. No acute intracranial infarct or other abnormality identified. MRA HEAD: Normal intracranial MRA. No large vessel occlusion or hemodynamically significant stenosis. No aneurysm. MRA NECK: 1. 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 The distal right V4 segment is patent on corresponding MRA head portion of this exam with perfusion of the right PICA. 2. Widely patent dominant left vertebral artery. 3. Wide patency of both carotid artery systems within the neck. Electronically Signed   By: Jeannine Boga M.D.   On: 01/14/2021 19:19   NM Hepatobiliary Liver Func  Result Date: 02/09/2021 CLINICAL  DATA:  Right upper quadrant abdominal pain, nondiagnostic ultrasound EXAM: NUCLEAR MEDICINE HEPATOBILIARY IMAGING TECHNIQUE: Sequential images of the abdomen were obtained out to 60 minutes following intravenous administration of radiopharmaceutical. RADIOPHARMACEUTICALS:  7.3 mCi Tc-34m  Choletec IV COMPARISON:  MR abdomen, 02/08/2021 FINDINGS: Prompt uptake and biliary excretion of activity by the liver is seen. Gallbladder activity is visualized, consistent with patency of cystic duct. Biliary activity passes into small bowel, consistent with patent common bile duct.  IMPRESSION: 1.  Cystic duct is patent. 2.  Common bile duct is patent. Electronically Signed   By: Delanna Ahmadi M.D.   On: 02/09/2021 14:00   CT ABDOMEN PELVIS W CONTRAST  Result Date: 02/06/2021 CLINICAL DATA:  Abdominal pain, fever.  History of hernia repair EXAM: CT ABDOMEN AND PELVIS WITH CONTRAST TECHNIQUE: Multidetector CT imaging of the abdomen and pelvis was performed using the standard protocol following bolus administration of intravenous contrast. CONTRAST:  11mL OMNIPAQUE IOHEXOL 300 MG/ML  SOLN COMPARISON:  CT abdomen pelvis 04/20/2018 FINDINGS: Lower chest: No acute abnormality. Hepatobiliary: The hepatic parenchyma is diffusely mildly hypodense compared to the splenic parenchyma consistent with fatty infiltration. No focal liver abnormality. Gallstone noted within the gallbladder lumen. No gallbladder wall thickening or pericholecystic fluid. No biliary dilatation. Pancreas: No focal lesion. Normal pancreatic contour. No surrounding inflammatory changes. No main pancreatic ductal dilatation. Spleen: Normal in size without focal abnormality. Adrenals/Urinary Tract: No adrenal nodule bilaterally. Bilateral kidneys enhance symmetrically. There is a 1.4 cm left renal lesion with a density of 25 Hounsfield units. No hydronephrosis. No hydroureter. The urinary bladder is unremarkable. On delayed imaging, there is no urothelial wall thickening and there are no filling defects in the opacified portions of the bilateral collecting systems or ureters. Stomach/Bowel: Stomach is within normal limits. No evidence of bowel wall thickening or dilatation. Appendix appears normal. Vascular/Lymphatic: No abdominal aorta or iliac aneurysm. Moderate to severe atherosclerotic plaque of the aorta and its branches. No abdominal, pelvic, or inguinal lymphadenopathy. Reproductive: Prostate is unremarkable. Other: No intraperitoneal free fluid. No intraperitoneal free gas. No organized fluid collection. Musculoskeletal:  No abdominal wall hernia or abnormality. No suspicious lytic or blastic osseous lesions. No acute displaced fracture. Multilevel degenerative changes of the spine. IMPRESSION: 1. Cholelithiasis with no CT findings of acute cholecystitis. 2. Possible mild hepatic steatosis. 3. Indeterminate 1.4 cm left renal lesion. Recommend MRI renal protocol for further evaluation. 4.  Aortic Atherosclerosis (ICD10-I70.0). Electronically Signed   By: Iven Finn M.D.   On: 02/06/2021 19:40   MR 3D Recon At Scanner  Result Date: 02/08/2021 CLINICAL DATA:  Right upper quadrant abdominal pain. Stage 3 chronic kidney disease. Epigastric pain. Diabetes. Indeterminate left renal lesion on recent CT scan EXAM: MRI ABDOMEN WITHOUT AND WITH CONTRAST (INCLUDING MRCP) TECHNIQUE: Multiplanar multisequence MR imaging of the abdomen was performed both before and after the administration of intravenous contrast. Heavily T2-weighted images of the biliary and pancreatic ducts were obtained, and three-dimensional MRCP images were rendered by post processing. CONTRAST:  30mL GADAVIST GADOBUTROL 1 MMOL/ML IV SOLN COMPARISON:  CT abdomen and ultrasound abdomen 02/06/2021 FINDINGS: Lower chest: Dependent reticulation in the lower lobes greater than that shown on recent CT, suspicious for potential dependent interstitial edema and/or subsegmental atelectasis. Hepatobiliary: Diffuse hepatic steatosis. Sludge and small gallstones in the gallbladder. Mild perihepatic fluid or gallbladder wall thickening along the margin with the liver. No biliary dilatation. No filling defect in the common bile duct or common hepatic duct. Trace nonspecific periportal edema. Diffuse micronodular  heterogeneous enhancement of the liver on arterial phase images but subsequent normal hepatic enhancement on portal venous phase and delayed images. Pancreas:  Unremarkable Spleen:  No splenomegaly or focal splenic lesion identified. Adrenals/Urinary Tract:  Both adrenal  glands appear normal. Bosniak category 2 cyst of the left kidney lower pole measuring 1.4 by 1.2 by 1.5 cm, with high T1 and low T2 signal density component posteriorly. This lesion does not enhance. Adrenal glands unremarkable. Stomach/Bowel: Bilobed periampullary duodenal diverticulum. No dilated bowel. Vascular/Lymphatic: Atherosclerosis is present, including aortoiliac atherosclerotic disease. There is some atherosclerotic plaque along the origins of the SMA and celiac trunk. Other:  No supplemental non-categorized findings. Musculoskeletal: Unremarkable IMPRESSION: 1. Cholelithiasis with gallstones. Trace perihepatic fluid or gallbladder wall thickening along the margin with the liver. No biliary dilatation. 2. Increased (from 02/06/2021) reticulation and faint enhancement dependently in both lower lobes, query interstitial edema and/or atelectasis. 3. Trace nonspecific periportal edema. 4. Diffuse hepatic steatosis. Mildly heterogeneous arterial phase enhancement in the liver, I would suggest hepatitis panel to exclude the unlikely possibility of hepatitis. Later phase liver imaging appears normal. 5. Bosniak category 2 benign cyst of the left kidney lower pole (benign). 6. Atherosclerosis.  Aortic Atherosclerosis (ICD10-I70.0). Electronically Signed   By: Gaylyn Rong M.D.   On: 02/08/2021 17:26   US Abdomen Limited  Result Date: 02/06/2021 CLINICAL DATA:  Right upper quadrant pain EXAM: ULTRASOUND ABDOMEN LIMITED RIGHT UPPER QUADRANT COMPARISON:  CT 04/20/2018 FINDINGS: Gallbladder: No gallstones or wall thickening visualized. No sonographic Murphy sign noted by sonographer. Common bile duct: Diameter: 5.6 mm Liver: Diffusely echogenic. Suspicion of surface nodularity portal vein is patent on color Doppler imaging with normal direction of blood flow towards the liver. Other: None. IMPRESSION: 1. Negative for gallstones. 2. Echogenic liver consistent with hepatic steatosis and or hepatocellular  disease. Possible subtle contour nodularity as may be seen with cirrhosis Electronically Signed   By: Jasmine Pang M.D.   On: 02/06/2021 17:38   MR ABDOMEN MRCP W WO CONTAST  Result Date: 02/08/2021 CLINICAL DATA:  Right upper quadrant abdominal pain. Stage 3 chronic kidney disease. Epigastric pain. Diabetes. Indeterminate left renal lesion on recent CT scan EXAM: MRI ABDOMEN WITHOUT AND WITH CONTRAST (INCLUDING MRCP) TECHNIQUE: Multiplanar multisequence MR imaging of the abdomen was performed both before and after the administration of intravenous contrast. Heavily T2-weighted images of the biliary and pancreatic ducts were obtained, and three-dimensional MRCP images were rendered by post processing. CONTRAST:  5mL GADAVIST GADOBUTROL 1 MMOL/ML IV SOLN COMPARISON:  CT abdomen and ultrasound abdomen 02/06/2021 FINDINGS: Lower chest: Dependent reticulation in the lower lobes greater than that shown on recent CT, suspicious for potential dependent interstitial edema and/or subsegmental atelectasis. Hepatobiliary: Diffuse hepatic steatosis. Sludge and small gallstones in the gallbladder. Mild perihepatic fluid or gallbladder wall thickening along the margin with the liver. No biliary dilatation. No filling defect in the common bile duct or common hepatic duct. Trace nonspecific periportal edema. Diffuse micronodular heterogeneous enhancement of the liver on arterial phase images but subsequent normal hepatic enhancement on portal venous phase and delayed images. Pancreas:  Unremarkable Spleen:  No splenomegaly or focal splenic lesion identified. Adrenals/Urinary Tract:  Both adrenal glands appear normal. Bosniak category 2 cyst of the left kidney lower pole measuring 1.4 by 1.2 by 1.5 cm, with high T1 and low T2 signal density component posteriorly. This lesion does not enhance. Adrenal glands unremarkable. Stomach/Bowel: Bilobed periampullary duodenal diverticulum. No dilated bowel. Vascular/Lymphatic:  Atherosclerosis is present, including aortoiliac atherosclerotic  disease. There is some atherosclerotic plaque along the origins of the SMA and celiac trunk. Other:  No supplemental non-categorized findings. Musculoskeletal: Unremarkable IMPRESSION: 1. Cholelithiasis with gallstones. Trace perihepatic fluid or gallbladder wall thickening along the margin with the liver. No biliary dilatation. 2. Increased (from 02/06/2021) reticulation and faint enhancement dependently in both lower lobes, query interstitial edema and/or atelectasis. 3. Trace nonspecific periportal edema. 4. Diffuse hepatic steatosis. Mildly heterogeneous arterial phase enhancement in the liver, I would suggest hepatitis panel to exclude the unlikely possibility of hepatitis. Later phase liver imaging appears normal. 5. Bosniak category 2 benign cyst of the left kidney lower pole (benign). 6. Atherosclerosis.  Aortic Atherosclerosis (ICD10-I70.0). Electronically Signed   By: Van Clines M.D.   On: 02/08/2021 17:26    ASSESSMENT: This is a very pleasant 70 years old white male who was admitted to the hospital with abdominal pain likely secondary to cholelithiasis with no clear cholecystitis on the imaging studies.  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  He has this problem for several years and the patient has never been symptomatic from his condition with no concerning bleeding issues.   PLAN: I had a lengthy discussion with the patient today about his condition. I ordered DIC as well as peripheral blood smear and LDH today. Again I think this is ITP and there is no need for intervention or treatment unless the patient has platelets count less than 30,000 or he becomes symptomatic with significant bleeding, bruises or ecchymosis. I do not see any other underlying hematologic disorder or bone marrow issues since the patient has normal  leukocyte and erythrocytes. I recommended for the patient to continue on monitoring and close observation by his primary care physician after discharge.  I will be happy to see him in the clinic if he develop any concerning issues in the future with significant thrombocytopenia or bleeding abnormalities.  Otherwise follow-up with his primary care physician is appropriate at this point. Regarding his liver dysfunction, the patient is followed by gastroenterology and they will try to figure out the underlying cause of his problem. The patient voices understanding of current disease status and treatment options and is in agreement with the current care plan.  All questions were answered. The patient knows to call the clinic with any problems, questions or concerns. We can certainly see the patient much sooner if necessary.  Thank you so much for allowing me to participate in the care of Kenneth Deems Sr.. I will continue to follow up the patient with you and assist in his care.   Disclaimer: This note was dictated with voice recognition software. Similar sounding words can inadvertently be transcribed and may not be corrected upon review.   Eilleen Kempf February 10, 2021, 10:33 AM

## 2021-02-11 ENCOUNTER — Inpatient Hospital Stay (HOSPITAL_COMMUNITY): Payer: Medicare Other

## 2021-02-11 DIAGNOSIS — K8050 Calculus of bile duct without cholangitis or cholecystitis without obstruction: Secondary | ICD-10-CM

## 2021-02-11 DIAGNOSIS — R7989 Other specified abnormal findings of blood chemistry: Secondary | ICD-10-CM

## 2021-02-11 LAB — COMPREHENSIVE METABOLIC PANEL
ALT: 129 U/L — ABNORMAL HIGH (ref 0–44)
AST: 53 U/L — ABNORMAL HIGH (ref 15–41)
Albumin: 3.5 g/dL (ref 3.5–5.0)
Alkaline Phosphatase: 101 U/L (ref 38–126)
Anion gap: 9 (ref 5–15)
BUN: 17 mg/dL (ref 8–23)
CO2: 24 mmol/L (ref 22–32)
Calcium: 9.1 mg/dL (ref 8.9–10.3)
Chloride: 100 mmol/L (ref 98–111)
Creatinine, Ser: 1.02 mg/dL (ref 0.61–1.24)
GFR, Estimated: >60 mL/min (ref >60)
Glucose, Bld: 260 mg/dL — ABNORMAL HIGH (ref 70–99)
Potassium: 4.5 mmol/L (ref 3.5–5.1)
Sodium: 133 mmol/L — ABNORMAL LOW (ref 135–145)
Total Bilirubin: 1.4 mg/dL — ABNORMAL HIGH (ref 0.3–1.2)
Total Protein: 6.7 g/dL (ref 6.5–8.1)

## 2021-02-11 LAB — GLUCOSE, CAPILLARY
Glucose-Capillary: 236 mg/dL — ABNORMAL HIGH (ref 70–99)
Glucose-Capillary: 194 mg/dL — ABNORMAL HIGH (ref 70–99)
Glucose-Capillary: 363 mg/dL — ABNORMAL HIGH (ref 70–99)
Glucose-Capillary: 232 mg/dL — ABNORMAL HIGH (ref 70–99)

## 2021-02-11 MED ORDER — IOHEXOL 350 MG/ML SOLN
78.0000 mL | Freq: Once | INTRAVENOUS | Status: AC | PRN
  Administered 2021-02-11: 78 mL via INTRAVENOUS

## 2021-02-11 NOTE — Progress Notes (Signed)
Bilateral lower extremity venous duplex completed. Refer to "CV Proc" under chart review to view preliminary results.  02/11/2021 3:51 PM Eula Fried., MHA, RVT, RDCS, RDMS

## 2021-02-11 NOTE — Progress Notes (Addendum)
[facility] Progress Note  Chief Complaint: Upper abdominal pain, elevated liver lab  History:  No abdominal pain, eating well, no chest pain or dyspnea.  Medicine discovered elevated D-dimer, having ultrasounds and CTA to rule out PE.  Hematology and general surgery consults appreciated, discussed case with both of those physicians.  Objective:   Current Facility-Administered Medications:    acetaminophen (TYLENOL) tablet 650 mg, 650 mg, Oral, Q6H PRN, 650 mg at 02/07/21 2121 **OR** acetaminophen (TYLENOL) suppository 650 mg, 650 mg, Rectal, Q6H PRN, Karmen Bongo, MD   albuterol (PROVENTIL) (2.5 MG/3ML) 0.083% nebulizer solution 3 mL, 3 mL, Inhalation, Q6H PRN, Karmen Bongo, MD   bisacodyl (DULCOLAX) EC tablet 5 mg, 5 mg, Oral, Daily PRN, Karmen Bongo, MD   clopidogrel (PLAVIX) tablet 75 mg, 75 mg, Oral, Daily, Karmen Bongo, MD, 75 mg at 02/11/21 [id number]   docusate sodium (COLACE) capsule 100 mg, 100 mg, Oral, BID, Karmen Bongo, MD, 100 mg at 02/10/21 2202   gabapentin (NEURONTIN) capsule 200 mg, 200 mg, Oral, QHS, Karmen Bongo, MD, 200 mg at 02/10/21 2202   hydrALAZINE (APRESOLINE) injection 5 mg, 5 mg, Intravenous, Q4H PRN, Karmen Bongo, MD   insulin aspart (novoLOG) injection 0-15 Units, 0-15 Units, Subcutaneous, TID WC, Karmen Bongo, MD, 15 Units at 02/11/21 1311   insulin aspart (novoLOG) injection 0-5 Units, 0-5 Units, Subcutaneous, QHS, Karmen Bongo, MD, 2 Units at 02/10/21 2300   insulin glargine-yfgn (SEMGLEE) injection 40 Units, 40 Units, Subcutaneous, Daily, Karmen Bongo, MD, 40 Units at 02/11/21 0829   metoprolol succinate (TOPROL-XL) 24 hr tablet 25 mg, 25 mg, Oral, Daily, Karmen Bongo, MD, 25 mg at 02/11/21 [id number]   morphine 2 MG/ML injection 2 mg, 2 mg, Intravenous, Q2H PRN, Karmen Bongo, MD, 2 mg at 02/08/21 0935   ondansetron (ZOFRAN) tablet 4 mg, 4 mg, Oral, Q6H PRN **OR** ondansetron (ZOFRAN) injection 4 mg, 4 mg, Intravenous, Q6H PRN,  Karmen Bongo, MD   oxyCODONE (Oxy IR/ROXICODONE) immediate release tablet 5 mg, 5 mg, Oral, Q4H PRN, Karmen Bongo, MD, 5 mg at 02/08/21 2304   pantoprazole (PROTONIX) EC tablet 40 mg, 40 mg, Oral, QHS, Lama, Marge Duncans, MD   polyethylene glycol (MIRALAX / GLYCOLAX) packet 17 g, 17 g, Oral, Daily PRN, Karmen Bongo, MD   pramipexole (MIRAPEX) tablet 1 mg, 1 mg, Oral, Daily, Karmen Bongo, MD, 1 mg at 02/10/21 1751   pramipexole (MIRAPEX) tablet 1 mg, 1 mg, Oral, Daily, Karmen Bongo, MD, 1 mg at 02/10/21 1955   pramipexole (MIRAPEX) tablet 2 mg, 2 mg, Oral, QHS, Karmen Bongo, MD, 2 mg at 02/10/21 2202     Vital signs in last 24 hrs: Vitals:   02/11/21 0821 02/11/21 1228  BP: (!) 163/76   Pulse: 65 68  Resp: 18   Temp: 98.2 F (36.8 C) 97.7 F (36.5 C)  SpO2: 93% 94%    Intake/Output Summary (Last 24 hours) at 02/11/2021 1507 Last data filed at 02/10/2021 2311 Gross per 24 hour  Intake 200 ml  Output --  Net 200 ml     Physical Exam  HEENT: sclera anicteric, oral mucosa without lesions Neck: supple, no thyromegaly, JVD or lymphadenopathy Cardiac: RRR without murmurs, S1S2 heard, no peripheral edema Pulm: clear to auscultation bilaterally, normal RR and effort noted Abdomen: soft, no tenderness, with active bowel sounds. No guarding or palpable hepatosplenomegaly Skin; warm and dry, jaundice subsided  Recent Labs:  CBC Latest Ref Rng & Units 02/10/2021 02/10/2021 02/08/2021  WBC 4.0 - 10.5 K/uL -  5.3 5.5  Hemoglobin 13.0 - 17.0 g/dL - 15.1 12.5(L)  Hematocrit 39.0 - 52.0 % - 41.5 37.8(L)  Platelets 150 - 400 K/uL 121(L) 128(L) 89(L)    Recent Labs  Lab 02/10/21 1157  INR 0.9   CMP Latest Ref Rng & Units 02/11/2021 02/10/2021 02/09/2021  Glucose 70 - 99 mg/dL 761(Y) 073(X) 106(Y)  BUN 8 - 23 mg/dL 17 19 69(S)  Creatinine 0.61 - 1.24 mg/dL 8.54 6.27(O) 3.50(K)  Sodium 135 - 145 mmol/L 133(L) 132(L) 132(L)  Potassium 3.5 - 5.1 mmol/L 4.5 4.2 5.1   Chloride 98 - 111 mmol/L 100 100 98  CO2 22 - 32 mmol/L 24 24 26   Calcium 8.9 - 10.3 mg/dL 9.1 9.0 )  Total Protein 6.5 - 8.1 g/dL 6.7 6.4(L) 5.6(L)  Total Bilirubin 0.3 - 1.2 mg/dL 9.3(G) 1.8(E) 1.9(H)  Alkaline Phos 38 - 126 U/L 101 87 84  AST 15 - 41 U/L 53(H) 52(H) 74(H)  ALT 0 - 44 U/L 129(H) 151(H) 194(H)     Radiologic studies:   Assessment & Plan  Assessment:  Epigastric/chest pain Cholelithiasis without cholecystitis. Elevated LFTs, slowly improving.  No choledocholithiasis on MRCP.  Suspect patient passed biliary sludge or small stone. Underlying multivessel coronary disease.  Plan:  I discussed the case with Dr. 9.9(B of general surgery.  Inpatient cholecystectomy is not felt to be necessary, though close follow-up will be arranged with their hepatobiliary surgeon to review the case and plan cholecystectomy in the near future.  I do not want it to wait very long because of risk of recurrent passage of CBD stone/sludge in risk of pancreatitis or cholangitis.  Dr. Janee Morn says he will help arrange that follow-up.  Liver labs expected to steadily improve, they can be checked again in a week by primary care. Clinical impressions and plan discussed with the patient and his wife and all their questions were answered. No further testing or treatment on the inpatient side from my perspective, as needed follow-up with our clinic.  Signing off, thank you for your assistance with this case  Janee Morn III Office: [phone]

## 2021-02-11 NOTE — Discharge Summary (Addendum)
Physician Discharge Summary  Kenneth Hauter Sr. [id number] DOB: Mar 23, 1950 DOA: 02/06/2021  PCP: Kenneth Chandler, NP  Admit date: 02/06/2021 Discharge date: 02/11/2021  Time spent: 60 minutes  Recommendations for Outpatient Follow-up:  Follow-up general surgery in 3 weeks  Discharge Diagnoses:  Principal Problem:   Elevated LFTs Active Problems:   Coronary artery disease of native artery of native heart with stable angina pectoris (HCC)   Diabetes mellitus type 2, insulin dependent (HCC)   HLD (hyperlipidemia)   Restless leg syndrome   AKI (acute kidney injury) ([place])   Discharge Condition: Stable  Diet recommendation: Low-fat diet  Filed Weights   02/08/21 0951  Weight: 90.7 kg    History of present illness:  70 yr old male with h/o hyperlipidemia, CAD s/p stent, Diabetes mellitus type 2, CKD stage 3, RLS, recent admission for TIA presenting with abdominal pain. He has been having epigastric pain for 3 months. Pain radiates into chest , worsened with exertion. He was seen by cardiology and cleared for any GI procedures. Gastroenterology was consulted, Patient underwent MRCP today under general anesthesia due to continuous lower extremity myoclonic jerks from RLS.  Hospital Course:   Transaminitis/abdominal pain -MRCP only shows cholelithiasis, no biliary rotation -LFTs have improved,?  Passed stone -He was seen by cardiology and cleared for GI procedures -LFTs were significantly elevated; now improved -Ultrasound was negative for gallstones but showed apparent hepatocellular liver disease -CT abdomen pelvis showed cholelithiasis without cholecystitis -HIDA scan unremarkable -Continue IV Protonix -Started on diet yesterday without complication -Started on heart healthy diet -General surgery was consulted for biliary colic, plan for elective cholecystectomy as outpatient.  Patient will follow up with Dr. Barry Dienes as outpatient.   Acute kidney injury -Resolved, today  creatinine is 1.01 -Patient's creatinine was 1.81 yesterday; improved to 1.52 today with IV LR -IV fluids were discontinued after patient developed mild interstitial edema -   Elevated D-dimer -oatient complained of shortness of breath intermittently  -D-dimer elevated 0.88, was checked while oncology/oncology -Lower extremity venous duplex was negative for DVT  - CTA chest in negative for PE    Left renal lesion -1.4 cm left renal lesion seen on the CT scan -MRI renal was recommended, this was added to MRCP done today -MRI only shows benign cyst   CAD -S/p stent placement -Continue Plavix -Dr. Einar Gip as outpatient on 12/5 with initial plan for cath versus stress test, patient preferred stress test -He saw patient yesterday and cleared for GI procedure   Hypertension -Blood pressure is stable -Continue Toprol-XL     Restless leg syndrome -Patient has continuous lower extremity myoclonic jerks -Continue Mirapex as per home schedule -Had to be put under general anesthesia  for MRI of the abdomen   Diabetes mellitus type 2 -Hemoglobin A1c was 7.3  -Continue home medications   Hyperlipidemia -Patient reported Lipitor led to worsening of RLS -He is now taking Praluent   Thrombocytopenia -Chronic -Platelet count has been low since 2015 -DIC panel ordered by oncology -D-dimer elevated 0.88, fibrinogen 634 -Follow-up hematology/oncology as outpatient  Procedures: Gastroenterology General surgery Hematology  Consultations:   Discharge Exam: Vitals:   02/11/21 0821 02/11/21 1228  BP: (!) 163/76   Pulse: 65 68  Resp: 18   Temp: 98.2 F (36.8 C) 97.7 F (36.5 C)  SpO2: 93% 94%    General: Appears in no acute distress Cardiovascular: S1-S2, regular, no murmur auscultated Respiratory: Clear to auscultation bilaterally  Discharge Instructions   Discharge Instructions  Diet - low sodium heart healthy   Complete by: As directed    Increase activity  slowly   Complete by: As directed       Allergies as of 02/11/2021       Reactions   Lyrica [pregabalin] Other (See Comments)   Made patient very lethargic the next morning, very hard to patient to function, move, etc.    Statins Other (See Comments)   Causes Restless Legs, rhabdomyolysis   Ativan [lorazepam] Other (See Comments)   Markedly increased restless legs   Trulicity [dulaglutide] Other (See Comments)   GI side effects         Medication List     TAKE these medications    acetaminophen 500 MG tablet Commonly known as: TYLENOL Take 500 mg by mouth every 6 (six) hours as needed for mild pain, moderate pain, headache or fever.   albuterol 108 (90 Base) MCG/ACT inhaler Commonly known as: VENTOLIN HFA Inhale 2 puffs into the lungs every 6 (six) hours as needed for wheezing or shortness of breath.   Basaglar KwikPen 100 UNIT/ML Inject 40 units subcutaneously into the skin at bedtime What changed:  how much to take when to take this additional instructions   BD Pen Needle Nano 2nd Gen 32G X 4 MM Misc Generic drug: Insulin Pen Needle USE AS DIRECTED   clopidogrel 75 MG tablet Commonly known as: PLAVIX Take 1 tablet (75 mg total) by mouth daily.   D-5000 125 MCG (5000 UT) Tabs Generic drug: Cholecalciferol Take 1 tablet (5,000 Units total) by mouth daily. E55.9 What changed:  how much to take additional instructions   dapagliflozin propanediol 10 MG Tabs tablet Commonly known as: Farxiga Take 1 tablet (10 mg total) by mouth daily before breakfast.   famotidine 20 MG tablet Commonly known as: PEPCID TAKE 1 TABLET BY MOUTH EVERY DAY   fluticasone 50 MCG/ACT nasal spray Commonly known as: FLONASE Place 1 spray into both nostrils daily as needed for allergies or rhinitis.   FreeStyle Libre 14 Day Reader Kerrin Mo Inject 1 Device as directed daily as needed. Use once daily as direct to check blood sugar E11.51   FreeStyle Libre 14 Day Sensor Misc Use to  test blood sugar three times daily. E11.51. E11.59   gabapentin 100 MG capsule Commonly known as: NEURONTIN Take 2 capsules (200 mg total) by mouth at bedtime.   glucose 4 GM chewable tablet Chew 1 tablet (4 g total) by mouth as needed for low blood sugar.   LIFESCAN FINEPOINT LANCETS Misc Use to test for blood sugar three times daily dx: E11.51   metoprolol succinate 25 MG 24 hr tablet Commonly known as: TOPROL-XL Take 0.5 tablets (12.5 mg total) by mouth daily. Take with or immediately following a meal. What changed:  how much to take additional instructions   nitroGLYCERIN 0.4 MG SL tablet Commonly known as: NITROSTAT Place 1 tablet (0.4 mg total) under the tongue every 5 (five) minutes as needed for chest pain.   NovoLOG FlexPen 100 UNIT/ML FlexPen Generic drug: insulin aspart Inject 15-20 Units into the skin 3 (three) times daily with meals. What changed: how much to take   OneTouch Ultra test strip Generic drug: glucose blood USE TO TEST FOR BLOOD SUGAR THREE TIMES DAILY DX: E11.51   pantoprazole 40 MG tablet Commonly known as: PROTONIX Take one tablet by mouth once daily for stomach What changed:  how much to take how to take this when to take this additional instructions  Praluent 150 MG/ML Soaj Generic drug: Alirocumab Inject 1 pen into the skin every 14 (fourteen) days.   pramipexole 1 MG tablet Commonly known as: MIRAPEX TAKE 1 TABLET BY MOUTH AT 5PM, 1 TABLET BY MOUTH AT 7PM, AND TAKE 2 TABLETS BY MOUTH AT BEDTIME. What changed:  how much to take how to take this when to take this additional instructions   valsartan 160 MG tablet Commonly known as: Diovan Take 1 tablet (160 mg total) by mouth every evening.       Allergies  Allergen Reactions   Lyrica [Pregabalin] Other (See Comments)    Made patient very lethargic the next morning, very hard to patient to function, move, etc.    Statins Other (See Comments)    Causes Restless Legs,  rhabdomyolysis   Ativan [Lorazepam] Other (See Comments)    Markedly increased restless legs   Trulicity [Dulaglutide] Other (See Comments)    GI side effects     Follow-up Information     Stark Klein, MD Follow up in 3 week(s).   Specialty: General Surgery Contact information: [address] [place] [id number] [phone]                  The results of significant diagnostics from this hospitalization (including imaging, microbiology, ancillary and laboratory) are listed below for reference.    Significant Diagnostic Studies: DG Chest 2 View  Result Date: 02/01/2021 CLINICAL DATA:  Chest pain. EXAM: CHEST - 2 VIEW COMPARISON:  Chest x-ray 05/04/2019. FINDINGS: The heart size and mediastinal contours are within normal limits. Both lungs are clear. The visualized skeletal structures are unremarkable. IMPRESSION: No active cardiopulmonary disease. Electronically Signed   By: Ronney Asters M.D.   On: 02/01/2021 19:07   DG Chest 2 View  Result Date: 01/31/2021 CLINICAL DATA:  Chest pain mild shortness of breath EXAM: CHEST - 2 VIEW COMPARISON:  Chest radiograph 05/04/2019 FINDINGS: The cardiomediastinal silhouette is normal. There is no focal consolidation or pulmonary edema. There is no pleural effusion or pneumothorax. There is no acute osseous abnormality. IMPRESSION: No radiographic evidence of acute cardiopulmonary process. Electronically Signed   By: Valetta Mole M.D.   On: 01/31/2021 10:10   CT HEAD WO CONTRAST  Result Date: 01/14/2021 CLINICAL DATA:  Neuro deficit, acute, stroke suspected EXAM: CT HEAD WITHOUT CONTRAST TECHNIQUE: Contiguous axial images were obtained from the base of the skull through the vertex without intravenous contrast. COMPARISON:  None. FINDINGS: Brain: No evidence of acute infarction, hemorrhage, hydrocephalus, extra-axial collection or mass lesion/mass effect. Vascular: Vascular calcifications of the carotid siphons. Skull:  Normal. Negative for fracture or focal lesion. Sinuses/Orbits: No acute finding. Other: None. IMPRESSION: No acute intracranial abnormality. Electronically Signed   By: Valentino Saxon M.D.   On: 01/14/2021 13:00   MR ANGIO HEAD WO CONTRAST  Result Date: 01/14/2021 CLINICAL DATA:  Initial evaluation for acute TIA, left-sided facial droop with blurry vision. EXAM: MRI HEAD WITHOUT CONTRAST MRA HEAD WITHOUT CONTRAST MRA NECK WITHOUT CONTRAST TECHNIQUE: Multiplanar, multiecho pulse sequences of the brain and surrounding structures were obtained without intravenous contrast. Angiographic images of the Circle of Willis were obtained using MRA technique without intravenous contrast. Angiographic images of the neck were obtained using MRA technique without intravenous contrast. Carotid stenosis measurements (when applicable) are obtained utilizing NASCET criteria, using the distal internal carotid diameter as the denominator. COMPARISON:  CT from earlier the same day. FINDINGS: MRI HEAD FINDINGS Brain: Cerebral volume within normal limits for  age. No focal parenchymal signal abnormality or significant cerebral white matter disease. No abnormal foci of restricted diffusion to suggest acute or subacute ischemia. Gray-white matter differentiation maintained. No encephalomalacia to suggest chronic cortical infarction. No acute intracranial hemorrhage. Single punctate chronic microhemorrhage noted at the anterior right frontal lobe of doubtful significance in isolation. No mass lesion, midline shift or mass effect no hydrocephalus or extra-axial fluid collection. Pituitary gland suprasellar region within normal limits. Midline structures intact. Vascular: Major intracranial vascular flow voids are maintained. Skull and upper cervical spine: Craniocervical junction within normal limits. Bone marrow signal intensity normal. No scalp soft tissue abnormality. Sinuses/Orbits: Globes and orbital soft tissues within normal  limits. Paranasal sinuses are clear. No significant mastoid effusion. Inner ear structures grossly normal. Other: None. MRA HEAD FINDINGS ANTERIOR CIRCULATION: Visualized distal cervical segments of the internal carotid arteries are patent with antegrade flow. Petrous, cavernous, and supraclinoid segments patent without stenosis or other abnormality. A1 segments patent bilaterally. Right A1 hypoplastic. Normal anterior communicating artery complex. Anterior cerebral arteries patent their distal aspects without stenosis. No M1 stenosis or occlusion. Normal MCA bifurcations. Distal MCA branches perfused and symmetric. POSTERIOR CIRCULATION: Left vertebral artery dominant and widely patent to the vertebrobasilar junction. Right vertebral artery hypoplastic and patent as well. Both PICA origins patent and normal. Basilar patent to its distal aspect without stenosis. Superior cerebellar arteries patent bilaterally. Left PCA supplied via the basilar. Fetal type origin of the right PCA. Both PCAs well perfused to their distal aspects. No intracranial aneurysm. MRA NECK FINDINGS AORTIC ARCH: Examination somewhat technically limited by lack of IV contrast and motion artifact. Visualized aortic arch normal in caliber with normal 3 vessel morphology. No stenosis about the origin the great vessels. RIGHT CAROTID SYSTEM: Visualized right CCA patent without stenosis. No significant atheromatous stenosis about the right carotid bulb. Visualized right ICA patent distally without stenosis, evidence for dissection or occlusion. LEFT CAROTID SYSTEM: Visualized left CCA patent without visible stenosis. No significant atheromatous stenosis about the left carotid bulb. Visualized left ICA patent distally without evidence for dissection or stenosis. VERTEBRAL ARTERIES: Both vertebral arteries appear to arise from the subclavian arteries. Left vertebral artery strongly dominant and widely patent within the neck without dissection or  stenosis. Right vertebral artery hypoplastic, seen on corresponding MRA head portion of this exam. The right vertebral artery is not visualized within the neck, which could be occluded. IMPRESSION: MRI HEAD: Normal brain MRI for age. No acute intracranial infarct or other abnormality identified. MRA HEAD: Normal intracranial MRA. No large vessel occlusion or hemodynamically significant stenosis. No aneurysm. MRA NECK: 1. 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 The distal right V4 segment is patent on corresponding MRA head portion of this exam with perfusion of the right PICA. 2. Widely patent dominant left vertebral artery. 3. Wide patency of both carotid artery systems within the neck. Electronically Signed   By: Jeannine Boga M.D.   On: 01/14/2021 19:19   MR ANGIO NECK WO CONTRAST  Result Date: 01/14/2021 CLINICAL DATA:  Initial evaluation for acute TIA, left-sided facial droop with blurry vision. EXAM: MRI HEAD WITHOUT CONTRAST MRA HEAD WITHOUT CONTRAST MRA NECK WITHOUT CONTRAST TECHNIQUE: Multiplanar, multiecho pulse sequences of the brain and surrounding structures were obtained without intravenous contrast. Angiographic images of the Circle of Willis were obtained using MRA technique without intravenous contrast. Angiographic images of the neck were obtained using MRA technique without intravenous contrast. Carotid stenosis measurements (when  applicable) are obtained utilizing NASCET criteria, using the distal internal carotid diameter as the denominator. COMPARISON:  CT from earlier the same day. FINDINGS: MRI HEAD FINDINGS Brain: Cerebral volume within normal limits for age. No focal parenchymal signal abnormality or significant cerebral white matter disease. No abnormal foci of restricted diffusion to suggest acute or subacute ischemia. Gray-white matter differentiation maintained. No encephalomalacia to suggest chronic cortical  infarction. No acute intracranial hemorrhage. Single punctate chronic microhemorrhage noted at the anterior right frontal lobe of doubtful significance in isolation. No mass lesion, midline shift or mass effect no hydrocephalus or extra-axial fluid collection. Pituitary gland suprasellar region within normal limits. Midline structures intact. Vascular: Major intracranial vascular flow voids are maintained. Skull and upper cervical spine: Craniocervical junction within normal limits. Bone marrow signal intensity normal. No scalp soft tissue abnormality. Sinuses/Orbits: Globes and orbital soft tissues within normal limits. Paranasal sinuses are clear. No significant mastoid effusion. Inner ear structures grossly normal. Other: None. MRA HEAD FINDINGS ANTERIOR CIRCULATION: Visualized distal cervical segments of the internal carotid arteries are patent with antegrade flow. Petrous, cavernous, and supraclinoid segments patent without stenosis or other abnormality. A1 segments patent bilaterally. Right A1 hypoplastic. Normal anterior communicating artery complex. Anterior cerebral arteries patent their distal aspects without stenosis. No M1 stenosis or occlusion. Normal MCA bifurcations. Distal MCA branches perfused and symmetric. POSTERIOR CIRCULATION: Left vertebral artery dominant and widely patent to the vertebrobasilar junction. Right vertebral artery hypoplastic and patent as well. Both PICA origins patent and normal. Basilar patent to its distal aspect without stenosis. Superior cerebellar arteries patent bilaterally. Left PCA supplied via the basilar. Fetal type origin of the right PCA. Both PCAs well perfused to their distal aspects. No intracranial aneurysm. MRA NECK FINDINGS AORTIC ARCH: Examination somewhat technically limited by lack of IV contrast and motion artifact. Visualized aortic arch normal in caliber with normal 3 vessel morphology. No stenosis about the origin the great vessels. RIGHT CAROTID  SYSTEM: Visualized right CCA patent without stenosis. No significant atheromatous stenosis about the right carotid bulb. Visualized right ICA patent distally without stenosis, evidence for dissection or occlusion. LEFT CAROTID SYSTEM: Visualized left CCA patent without visible stenosis. No significant atheromatous stenosis about the left carotid bulb. Visualized left ICA patent distally without evidence for dissection or stenosis. VERTEBRAL ARTERIES: Both vertebral arteries appear to arise from the subclavian arteries. Left vertebral artery strongly dominant and widely patent within the neck without dissection or stenosis. Right vertebral artery hypoplastic, seen on corresponding MRA head portion of this exam. The right vertebral artery is not visualized within the neck, which could be occluded. IMPRESSION: MRI HEAD: Normal brain MRI for age. No acute intracranial infarct or other abnormality identified. MRA HEAD: Normal intracranial MRA. No large vessel occlusion or hemodynamically significant stenosis. No aneurysm. MRA NECK: 1. 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 The distal right V4 segment is patent on corresponding MRA head portion of this exam with perfusion of the right PICA. 2. Widely patent dominant left vertebral artery. 3. Wide patency of both carotid artery systems within the neck. Electronically Signed   By: Jeannine Boga M.D.   On: 01/14/2021 19:19   MR BRAIN WO CONTRAST  Result Date: 01/14/2021 CLINICAL DATA:  Initial evaluation for acute TIA, left-sided facial droop with blurry vision. EXAM: MRI HEAD WITHOUT CONTRAST MRA HEAD WITHOUT CONTRAST MRA NECK WITHOUT CONTRAST TECHNIQUE: Multiplanar, multiecho pulse sequences of the brain and surrounding structures were obtained without intravenous  contrast. Angiographic images of the Circle of Willis were obtained using MRA technique without intravenous contrast. Angiographic images of  the neck were obtained using MRA technique without intravenous contrast. Carotid stenosis measurements (when applicable) are obtained utilizing NASCET criteria, using the distal internal carotid diameter as the denominator. COMPARISON:  CT from earlier the same day. FINDINGS: MRI HEAD FINDINGS Brain: Cerebral volume within normal limits for age. No focal parenchymal signal abnormality or significant cerebral white matter disease. No abnormal foci of restricted diffusion to suggest acute or subacute ischemia. Gray-white matter differentiation maintained. No encephalomalacia to suggest chronic cortical infarction. No acute intracranial hemorrhage. Single punctate chronic microhemorrhage noted at the anterior right frontal lobe of doubtful significance in isolation. No mass lesion, midline shift or mass effect no hydrocephalus or extra-axial fluid collection. Pituitary gland suprasellar region within normal limits. Midline structures intact. Vascular: Major intracranial vascular flow voids are maintained. Skull and upper cervical spine: Craniocervical junction within normal limits. Bone marrow signal intensity normal. No scalp soft tissue abnormality. Sinuses/Orbits: Globes and orbital soft tissues within normal limits. Paranasal sinuses are clear. No significant mastoid effusion. Inner ear structures grossly normal. Other: None. MRA HEAD FINDINGS ANTERIOR CIRCULATION: Visualized distal cervical segments of the internal carotid arteries are patent with antegrade flow. Petrous, cavernous, and supraclinoid segments patent without stenosis or other abnormality. A1 segments patent bilaterally. Right A1 hypoplastic. Normal anterior communicating artery complex. Anterior cerebral arteries patent their distal aspects without stenosis. No M1 stenosis or occlusion. Normal MCA bifurcations. Distal MCA branches perfused and symmetric. POSTERIOR CIRCULATION: Left vertebral artery dominant and widely patent to the vertebrobasilar  junction. Right vertebral artery hypoplastic and patent as well. Both PICA origins patent and normal. Basilar patent to its distal aspect without stenosis. Superior cerebellar arteries patent bilaterally. Left PCA supplied via the basilar. Fetal type origin of the right PCA. Both PCAs well perfused to their distal aspects. No intracranial aneurysm. MRA NECK FINDINGS AORTIC ARCH: Examination somewhat technically limited by lack of IV contrast and motion artifact. Visualized aortic arch normal in caliber with normal 3 vessel morphology. No stenosis about the origin the great vessels. RIGHT CAROTID SYSTEM: Visualized right CCA patent without stenosis. No significant atheromatous stenosis about the right carotid bulb. Visualized right ICA patent distally without stenosis, evidence for dissection or occlusion. LEFT CAROTID SYSTEM: Visualized left CCA patent without visible stenosis. No significant atheromatous stenosis about the left carotid bulb. Visualized left ICA patent distally without evidence for dissection or stenosis. VERTEBRAL ARTERIES: Both vertebral arteries appear to arise from the subclavian arteries. Left vertebral artery strongly dominant and widely patent within the neck without dissection or stenosis. Right vertebral artery hypoplastic, seen on corresponding MRA head portion of this exam. The right vertebral artery is not visualized within the neck, which could be occluded. IMPRESSION: MRI HEAD: Normal brain MRI for age. No acute intracranial infarct or other abnormality identified. MRA HEAD: Normal intracranial MRA. No large vessel occlusion or hemodynamically significant stenosis. No aneurysm. MRA NECK: 1. 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 The distal right V4 segment is patent on corresponding MRA head portion of this exam with perfusion of the right PICA. 2. Widely patent dominant left vertebral artery. 3. Wide patency of both carotid  artery systems within the neck. Electronically Signed   By: Jeannine Boga M.D.   On: 01/14/2021 19:19   NM Hepatobiliary Liver Func  Result Date: 02/09/2021 CLINICAL DATA:  Right upper quadrant abdominal pain,  nondiagnostic ultrasound EXAM: NUCLEAR MEDICINE HEPATOBILIARY IMAGING TECHNIQUE: Sequential images of the abdomen were obtained out to 60 minutes following intravenous administration of radiopharmaceutical. RADIOPHARMACEUTICALS:  7.3 mCi Tc-63m  Choletec IV COMPARISON:  MR abdomen, 02/08/2021 FINDINGS: Prompt uptake and biliary excretion of activity by the liver is seen. Gallbladder activity is visualized, consistent with patency of cystic duct. Biliary activity passes into small bowel, consistent with patent common bile duct. IMPRESSION: 1.  Cystic duct is patent. 2.  Common bile duct is patent. Electronically Signed   By: Delanna Ahmadi M.D.   On: 02/09/2021 14:00   CT ABDOMEN PELVIS W CONTRAST  Result Date: 02/06/2021 CLINICAL DATA:  Abdominal pain, fever.  History of hernia repair EXAM: CT ABDOMEN AND PELVIS WITH CONTRAST TECHNIQUE: Multidetector CT imaging of the abdomen and pelvis was performed using the standard protocol following bolus administration of intravenous contrast. CONTRAST:  117mL OMNIPAQUE IOHEXOL 300 MG/ML  SOLN COMPARISON:  CT abdomen pelvis 04/20/2018 FINDINGS: Lower chest: No acute abnormality. Hepatobiliary: The hepatic parenchyma is diffusely mildly hypodense compared to the splenic parenchyma consistent with fatty infiltration. No focal liver abnormality. Gallstone noted within the gallbladder lumen. No gallbladder wall thickening or pericholecystic fluid. No biliary dilatation. Pancreas: No focal lesion. Normal pancreatic contour. No surrounding inflammatory changes. No main pancreatic ductal dilatation. Spleen: Normal in size without focal abnormality. Adrenals/Urinary Tract: No adrenal nodule bilaterally. Bilateral kidneys enhance symmetrically. There is a 1.4 cm  left renal lesion with a density of 25 Hounsfield units. No hydronephrosis. No hydroureter. The urinary bladder is unremarkable. On delayed imaging, there is no urothelial wall thickening and there are no filling defects in the opacified portions of the bilateral collecting systems or ureters. Stomach/Bowel: Stomach is within normal limits. No evidence of bowel wall thickening or dilatation. Appendix appears normal. Vascular/Lymphatic: No abdominal aorta or iliac aneurysm. Moderate to severe atherosclerotic plaque of the aorta and its branches. No abdominal, pelvic, or inguinal lymphadenopathy. Reproductive: Prostate is unremarkable. Other: No intraperitoneal free fluid. No intraperitoneal free gas. No organized fluid collection. Musculoskeletal: No abdominal wall hernia or abnormality. No suspicious lytic or blastic osseous lesions. No acute displaced fracture. Multilevel degenerative changes of the spine. IMPRESSION: 1. Cholelithiasis with no CT findings of acute cholecystitis. 2. Possible mild hepatic steatosis. 3. Indeterminate 1.4 cm left renal lesion. Recommend MRI renal protocol for further evaluation. 4.  Aortic Atherosclerosis (ICD10-I70.0). Electronically Signed   By: Iven Finn M.D.   On: 02/06/2021 19:40   MR 3D Recon At Scanner  Result Date: 02/08/2021 CLINICAL DATA:  Right upper quadrant abdominal pain. Stage 3 chronic kidney disease. Epigastric pain. Diabetes. Indeterminate left renal lesion on recent CT scan EXAM: MRI ABDOMEN WITHOUT AND WITH CONTRAST (INCLUDING MRCP) TECHNIQUE: Multiplanar multisequence MR imaging of the abdomen was performed both before and after the administration of intravenous contrast. Heavily T2-weighted images of the biliary and pancreatic ducts were obtained, and three-dimensional MRCP images were rendered by post processing. CONTRAST:  63mL GADAVIST GADOBUTROL 1 MMOL/ML IV SOLN COMPARISON:  CT abdomen and ultrasound abdomen 02/06/2021 FINDINGS: Lower chest:  Dependent reticulation in the lower lobes greater than that shown on recent CT, suspicious for potential dependent interstitial edema and/or subsegmental atelectasis. Hepatobiliary: Diffuse hepatic steatosis. Sludge and small gallstones in the gallbladder. Mild perihepatic fluid or gallbladder wall thickening along the margin with the liver. No biliary dilatation. No filling defect in the common bile duct or common hepatic duct. Trace nonspecific periportal edema. Diffuse micronodular heterogeneous enhancement of the liver on arterial  phase images but subsequent normal hepatic enhancement on portal venous phase and delayed images. Pancreas:  Unremarkable Spleen:  No splenomegaly or focal splenic lesion identified. Adrenals/Urinary Tract:  Both adrenal glands appear normal. Bosniak category 2 cyst of the left kidney lower pole measuring 1.4 by 1.2 by 1.5 cm, with high T1 and low T2 signal density component posteriorly. This lesion does not enhance. Adrenal glands unremarkable. Stomach/Bowel: Bilobed periampullary duodenal diverticulum. No dilated bowel. Vascular/Lymphatic: Atherosclerosis is present, including aortoiliac atherosclerotic disease. There is some atherosclerotic plaque along the origins of the SMA and celiac trunk. Other:  No supplemental non-categorized findings. Musculoskeletal: Unremarkable IMPRESSION: 1. Cholelithiasis with gallstones. Trace perihepatic fluid or gallbladder wall thickening along the margin with the liver. No biliary dilatation. 2. Increased (from 02/06/2021) reticulation and faint enhancement dependently in both lower lobes, query interstitial edema and/or atelectasis. 3. Trace nonspecific periportal edema. 4. Diffuse hepatic steatosis. Mildly heterogeneous arterial phase enhancement in the liver, I would suggest hepatitis panel to exclude the unlikely possibility of hepatitis. Later phase liver imaging appears normal. 5. Bosniak category 2 benign cyst of the left kidney lower pole  (benign). 6. Atherosclerosis.  Aortic Atherosclerosis (ICD10-I70.0). Electronically Signed   By: Gaylyn RongWalter  Liebkemann M.D.   On: 02/08/2021 17:26   US Abdomen Limited  Result Date: 02/06/2021 CLINICAL DATA:  Right upper quadrant pain EXAM: ULTRASOUND ABDOMEN LIMITED RIGHT UPPER QUADRANT COMPARISON:  CT 04/20/2018 FINDINGS: Gallbladder: No gallstones or wall thickening visualized. No sonographic Murphy sign noted by sonographer. Common bile duct: Diameter: 5.6 mm Liver: Diffusely echogenic. Suspicion of surface nodularity portal vein is patent on color Doppler imaging with normal direction of blood flow towards the liver. Other: None. IMPRESSION: 1. Negative for gallstones. 2. Echogenic liver consistent with hepatic steatosis and or hepatocellular disease. Possible subtle contour nodularity as may be seen with cirrhosis Electronically Signed   By: Jasmine PangKim  Fujinaga M.D.   On: 02/06/2021 17:38   MR ABDOMEN MRCP W WO CONTAST  Result Date: 02/08/2021 CLINICAL DATA:  Right upper quadrant abdominal pain. Stage 3 chronic kidney disease. Epigastric pain. Diabetes. Indeterminate left renal lesion on recent CT scan EXAM: MRI ABDOMEN WITHOUT AND WITH CONTRAST (INCLUDING MRCP) TECHNIQUE: Multiplanar multisequence MR imaging of the abdomen was performed both before and after the administration of intravenous contrast. Heavily T2-weighted images of the biliary and pancreatic ducts were obtained, and three-dimensional MRCP images were rendered by post processing. CONTRAST:  9mL GADAVIST GADOBUTROL 1 MMOL/ML IV SOLN COMPARISON:  CT abdomen and ultrasound abdomen 02/06/2021 FINDINGS: Lower chest: Dependent reticulation in the lower lobes greater than that shown on recent CT, suspicious for potential dependent interstitial edema and/or subsegmental atelectasis. Hepatobiliary: Diffuse hepatic steatosis. Sludge and small gallstones in the gallbladder. Mild perihepatic fluid or gallbladder wall thickening along the margin with the  liver. No biliary dilatation. No filling defect in the common bile duct or common hepatic duct. Trace nonspecific periportal edema. Diffuse micronodular heterogeneous enhancement of the liver on arterial phase images but subsequent normal hepatic enhancement on portal venous phase and delayed images. Pancreas:  Unremarkable Spleen:  No splenomegaly or focal splenic lesion identified. Adrenals/Urinary Tract:  Both adrenal glands appear normal. Bosniak category 2 cyst of the left kidney lower pole measuring 1.4 by 1.2 by 1.5 cm, with high T1 and low T2 signal density component posteriorly. This lesion does not enhance. Adrenal glands unremarkable. Stomach/Bowel: Bilobed periampullary duodenal diverticulum. No dilated bowel. Vascular/Lymphatic: Atherosclerosis is present, including aortoiliac atherosclerotic disease. There is some atherosclerotic plaque along the  origins of the SMA and celiac trunk. Other:  No supplemental non-categorized findings. Musculoskeletal: Unremarkable IMPRESSION: 1. Cholelithiasis with gallstones. Trace perihepatic fluid or gallbladder wall thickening along the margin with the liver. No biliary dilatation. 2. Increased (from 02/06/2021) reticulation and faint enhancement dependently in both lower lobes, query interstitial edema and/or atelectasis. 3. Trace nonspecific periportal edema. 4. Diffuse hepatic steatosis. Mildly heterogeneous arterial phase enhancement in the liver, I would suggest hepatitis panel to exclude the unlikely possibility of hepatitis. Later phase liver imaging appears normal. 5. Bosniak category 2 benign cyst of the left kidney lower pole (benign). 6. Atherosclerosis.  Aortic Atherosclerosis (ICD10-I70.0). Electronically Signed   By: Van Clines M.D.   On: 02/08/2021 17:26   VAS [place] LOWER EXTREMITY VENOUS (DVT)  Result Date: 02/11/2021  Lower Venous DVT Study Patient Name:  Kenneth Dolph Sr.  Date of Exam:   02/11/2021 Medical Rec #: [id number]       Accession #:     [id number] Date of Birth: 1950/04/29       Patient Gender: M Patient Age:   70 years Exam Location:  [facility] Procedure:      VAS [place] LOWER EXTREMITY VENOUS (DVT) Referring Phys: Frederich Chick Janasha Barkalow --------------------------------------------------------------------------------  Indications: Elevated d-dimer.  Comparison Study: No prior study Performing Technologist: Maudry Mayhew MHA, RDMS, RVT, RDCS  Examination Guidelines: A complete evaluation includes B-mode imaging, spectral Doppler, color Doppler, and power Doppler as needed of all accessible portions of each vessel. Bilateral testing is considered an integral part of a complete examination. Limited examinations for reoccurring indications may be performed as noted. The reflux portion of the exam is performed with the patient in reverse Trendelenburg.  +---------+---------------+---------+-----------+-------------+--------------+ RIGHT    CompressibilityPhasicitySpontaneityProperties   Thrombus Aging +---------+---------------+---------+-----------+-------------+--------------+ CFV      Full           Yes      Yes                                    +---------+---------------+---------+-----------+-------------+--------------+ SFJ      Full                                                           +---------+---------------+---------+-----------+-------------+--------------+ FV Prox  Full                                                           +---------+---------------+---------+-----------+-------------+--------------+ FV Mid   Full                                                           +---------+---------------+---------+-----------+-------------+--------------+ FV DistalFull                                                           +---------+---------------+---------+-----------+-------------+--------------+  PFV      Full                                                            +---------+---------------+---------+-----------+-------------+--------------+ POP      Full           Yes      Yes        Rouleaux flow               +---------+---------------+---------+-----------+-------------+--------------+ PTV      Full                                                           +---------+---------------+---------+-----------+-------------+--------------+ PERO     Full                                                           +---------+---------------+---------+-----------+-------------+--------------+   +---------+---------------+---------+-----------+----------+--------------+ LEFT     CompressibilityPhasicitySpontaneityPropertiesThrombus Aging +---------+---------------+---------+-----------+----------+--------------+ CFV      Full           Yes      Yes                                 +---------+---------------+---------+-----------+----------+--------------+ SFJ      Full                                                        +---------+---------------+---------+-----------+----------+--------------+ FV Prox  Full                                                        +---------+---------------+---------+-----------+----------+--------------+ FV Mid   Full                                                        +---------+---------------+---------+-----------+----------+--------------+ FV DistalFull                                                        +---------+---------------+---------+-----------+----------+--------------+ PFV      Full                                                        +---------+---------------+---------+-----------+----------+--------------+  POP      Full           Yes      Yes                                 +---------+---------------+---------+-----------+----------+--------------+ PTV      Partial                                                      +---------+---------------+---------+-----------+----------+--------------+ PERO     Full                                                        +---------+---------------+---------+-----------+----------+--------------+     Summary: RIGHT: - There is no evidence of deep vein thrombosis in the lower extremity.  - No cystic structure found in the popliteal fossa.  LEFT: - There is no evidence of deep vein thrombosis in the lower extremity.  - No cystic structure found in the popliteal fossa.  *See table(s) above for measurements and observations.    Preliminary     Microbiology: Recent Results (from the past 240 hour(s))  Resp Panel by RT-PCR (Flu A&B, Covid) Nasopharyngeal Swab     Status: None   Collection Time: 02/06/21  2:30 PM   Specimen: Nasopharyngeal Swab; Nasopharyngeal(NP) swabs in vial transport medium  Result Value Ref Range Status   SARS Coronavirus 2 by RT PCR NEGATIVE NEGATIVE Final    Comment: (NOTE) SARS-CoV-2 target nucleic acids are NOT DETECTED.  The SARS-CoV-2 RNA is generally detectable in upper respiratory specimens during the acute phase of infection. The lowest concentration of SARS-CoV-2 viral copies this assay can detect is 138 copies/mL. A negative result does not preclude SARS-Cov-2 infection and should not be used as the sole basis for treatment or other patient management decisions. A negative result may occur with  improper specimen collection/handling, submission of specimen other than nasopharyngeal swab, presence of viral mutation(s) within the areas targeted by this assay, and inadequate number of viral copies(<138 copies/mL). A negative result must be combined with clinical observations, patient history, and epidemiological information. The expected result is Negative.  Fact Sheet for Patients:  EntrepreneurPulse.com.au  Fact Sheet for Healthcare Providers:  IncredibleEmployment.be  This test is no t yet  approved or cleared by the [place] FDA and  has been authorized for detection and/or diagnosis of SARS-CoV-2 by FDA under an Emergency Use Authorization (EUA). This EUA will remain  in effect (meaning this test can be used) for the duration of the COVID-19 declaration under Section 564(b)(1) of the Act, 21 U.S.C.section 360bbb-3(b)(1), unless the authorization is terminated  or revoked sooner.       Influenza A by PCR NEGATIVE NEGATIVE Final   Influenza B by PCR NEGATIVE NEGATIVE Final    Comment: (NOTE) The Xpert Xpress SARS-CoV-2/FLU/RSV plus assay is intended as an aid in the diagnosis of influenza from Nasopharyngeal swab specimens and should not be used as a sole basis for treatment. Nasal washings and aspirates are unacceptable for Xpert Xpress SARS-CoV-2/FLU/RSV testing.  Fact Sheet for Patients: EntrepreneurPulse.com.au  Fact Sheet for Healthcare Providers:  SeriousBroker.it  This test is not yet approved or cleared by the [place] and has been authorized for detection and/or diagnosis of SARS-CoV-2 by FDA under an Emergency Use Authorization (EUA). This EUA will remain in effect (meaning this test can be used) for the duration of the COVID-19 declaration under Section 564(b)(1) of the Act, 21 U.S.C. section 360bbb-3(b)(1), unless the authorization is terminated or revoked.  Performed at [facility], [address], [place], [place] [address]   MRSA Next Gen by PCR, Nasal     Status: None   Collection Time: 02/07/21  6:10 AM   Specimen: Nasal Mucosa; Nasal Swab  Result Value Ref Range Status   MRSA by PCR Next Gen NOT DETECTED NOT DETECTED Final    Comment: (NOTE) The GeneXpert MRSA Assay (FDA approved for NASAL specimens only), is one component of a comprehensive MRSA colonization surveillance program. It is not intended to diagnose MRSA infection nor to guide or monitor treatment for  MRSA infections. Test performance is not FDA approved in patients less than 36 years old. Performed at [facility], [address] [place], [place] [address]      Labs: Basic Metabolic Panel: Recent Labs  Lab 02/07/21 0826 02/08/21 0225 02/09/21 0240 02/10/21 1026 02/11/21 1137  NA 134* 130* 132* 132* 133*  K 5.0 4.0 5.1 4.2 4.5  CL 98 101 98 100 100  CO2 27 21* 26 24 24   GLUCOSE 315* 122* 327* 215* 260*  BUN 25* 24* 25* 19 17  CREATININE 1.81* 1.50* 1.52* 1.29* 1.02  CALCIUM 8.5* 8.4* 8.4* 9.0 9.1   Liver Function Tests: Recent Labs  Lab 02/07/21 0826 02/08/21 0225 02/09/21 0240 02/10/21 1026 02/11/21 1137  AST 270* 125* 74* 52* 53*  ALT 451* 263* 194* 151* 129*  ALKPHOS 88 69 84 87 101  BILITOT 5.7* 3.6* 1.9* 1.8* 1.4*  PROT 6.4* 5.6* 5.6* 6.4* 6.7  ALBUMIN 3.4* 2.9* 2.9* 3.2* 3.5   Recent Labs  Lab 02/06/21 1430 02/07/21 1430  LIPASE 24 26  AMYLASE  --  36   No results for input(s): AMMONIA in the last 168 hours. CBC: Recent Labs  Lab 02/06/21 1430 02/07/21 0826 02/08/21 0225 02/10/21 1026 02/10/21 1157  WBC 9.7 9.2 5.5 5.3  --   NEUTROABS  --  8.4*  --   --   --   HGB 14.9 14.3 12.5* 14.0  --   HCT 44.1 42.3 37.8* 41.5  --   MCV 87.7 88.9 90.4 88.3  --   PLT 112* 102* 89* 128* 121*   Cardiac Enzymes: No results for input(s): CKTOTAL, CKMB, CKMBINDEX, TROPONINI in the last 168 hours. BNP: BNP (last 3 results) Recent Labs    01/03/21 0825  BNP 16.8    ProBNP (last 3 results) No results for input(s): PROBNP in the last 8760 hours.  CBG: Recent Labs  Lab 02/10/21 2022 02/10/21 2221 02/11/21 0824 02/11/21 1012 02/11/21 1226  GLUCAP 272* 218* 236* 194* 363*       Signed:  14/11/22 MD.  [facility] 02/11/2021, 4:18 PM

## 2021-02-11 NOTE — Plan of Care (Signed)

## 2021-02-11 NOTE — Progress Notes (Signed)
[facility]  PROGRESS NOTE  Kenneth Cabiness Sr. [id number] DOB: Sep 26, 1950 DOA: 02/06/2021 PCP: Lauree Chandler, NP   Brief HPI:   70 yr old male with h/o hyperlipidemia, CAD s/p stent, Diabetes mellitus type 2, CKD stage 3, RLS, recent admission for TIA presenting with abdominal pain. He has been having epigastric pain for 3 months. Pain radiates into chest , worsened with exertion. He was seen by cardiology and cleared for any GI procedures. Gastroenterology was consulted, Patient underwent MRCP today under general anesthesia due to continuous lower extremity myoclonic jerks from RLS.   Subjective   Denies abdominal pain, seen by general surgery.  They plan to see him as outpatient for elective cholecystectomy.  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  They will follow patient in clinic.  D-dimer was elevated to 0.88.   Assessment/Plan:    Transaminitis/abdominal pain -MRCP only shows cholelithiasis, no biliary rotation -LFTs have improved,?  Passed stone -He was seen by cardiology and cleared for GI procedures -LFTs were significantly elevated; now improved -Ultrasound was negative for gallstones but showed apparent hepatocellular liver disease -CT abdomen pelvis showed cholelithiasis without cholecystitis -HIDA scan unremarkable -Continue IV Protonix -Started on diet yesterday without complication -Started on heart healthy diet -General surgery was consulted for biliary colic, plan for elective cholecystectomy as outpatient.  Patient will follow up with Dr. Barry Dienes as outpatient.  Acute kidney injury -Resolved, today creatinine is 1.01 -Patient's creatinine was 1.81 yesterday; improved to 1.52 today with IV LR -IV fluids were discontinued after patient developed mild interstitial edema --Diovan and Farxiga on hold in setting of renal dysfunction  Elevated D-dimer -D-dimer elevated 0.88, was checked while oncology/oncology -We will  check lower extremity venous Doppler and CTA chest to rule out pulmonary embolism  Left renal lesion -1.4 cm left renal lesion seen on the CT scan -MRI renal was recommended, this was added to MRCP done today -MRI only shows benign cyst  CAD -S/p stent placement -Continue Plavix -Dr. Einar Gip as outpatient on 12/5 with initial plan for cath versus stress test, patient preferred stress test -He saw patient yesterday and cleared for GI procedure  Hypertension -Blood pressure is stable -Continue Toprol-XL   Restless leg syndrome -Patient has continuous lower extremity myoclonic jerks -Continue Mirapex as per home schedule -Had to be put under general anesthesia  for MRI of the abdomen  Diabetes mellitus type 2 -Hemoglobin A1c was 7.3  -Farxiga on hold -Continue Lantus, sliding scale insulin NovoLog -CBG elevated fairly well controlled  Hyperlipidemia -Patient reported Lipitor led to worsening of RLS -He is now taking Praluent  Thrombocytopenia -Chronic -Platelet count has been low since 2015 -DIC panel ordered by oncology -D-dimer elevated 0.88, fibrinogen 634 -Follow-up hematology/oncology as outpatient  Medications     clopidogrel  75 mg Oral Daily   docusate sodium  100 mg Oral BID   gabapentin  200 mg Oral QHS   insulin aspart  0-15 Units Subcutaneous TID WC   insulin aspart  0-5 Units Subcutaneous QHS   insulin glargine-yfgn  40 Units Subcutaneous Daily   metoprolol succinate  25 mg Oral Daily   pantoprazole  40 mg Oral QHS   pramipexole  1 mg Oral Daily   pramipexole  1 mg Oral Daily   pramipexole  2 mg Oral QHS     Data Reviewed:   CBG:  Recent Labs  Lab 02/10/21 2022 02/10/21 2221 02/11/21 0824 02/11/21 1012 02/11/21 1226  GLUCAP 272*  218* 236* 194* 363*    SpO2: 94 % O2 Flow Rate (L/min): 4 L/min    Vitals:   02/10/21 2310 02/11/21 0346 02/11/21 0821 02/11/21 1228  BP: (!) 146/80 (!) 156/81 (!) 163/76   Pulse: (!) 58 62 65 68  Resp: 15  19 18    Temp: 98.1 F (36.7 C) 97.9 F (36.6 C) 98.2 F (36.8 C) 97.7 F (36.5 C)  TempSrc: Oral Oral Oral Oral  SpO2: 92% 96% 93% 94%  Weight:      Height:         Intake/Output Summary (Last 24 hours) at 02/11/2021 1403 Last data filed at 02/10/2021 2311 Gross per 24 hour  Intake 200 ml  Output --  Net 200 ml    12/09 1901 - 12/11 0700 In: 400 [P.O.:400] Out: -   Filed Weights   02/08/21 0951  Weight: 90.7 kg    Data Reviewed: Basic Metabolic Panel: Recent Labs  Lab 02/07/21 0826 02/08/21 0225 02/09/21 0240 02/10/21 1026 02/11/21 1137  NA 134* 130* 132* 132* 133*  K 5.0 4.0 5.1 4.2 4.5  CL 98 101 98 100 100  CO2 27 21* 26 24 24   GLUCOSE 315* 122* 327* 215* 260*  BUN 25* 24* 25* 19 17  CREATININE 1.81* 1.50* 1.52* 1.29* 1.02  CALCIUM 8.5* 8.4* 8.4* 9.0 9.1   Liver Function Tests: Recent Labs  Lab 02/07/21 0826 02/08/21 0225 02/09/21 0240 02/10/21 1026 02/11/21 1137  AST 270* 125* 74* 52* 53*  ALT 451* 263* 194* 151* 129*  ALKPHOS 88 69 84 87 101  BILITOT 5.7* 3.6* 1.9* 1.8* 1.4*  PROT 6.4* 5.6* 5.6* 6.4* 6.7  ALBUMIN 3.4* 2.9* 2.9* 3.2* 3.5   Recent Labs  Lab 02/06/21 1430 02/07/21 1430  LIPASE 24 26  AMYLASE  --  36   No results for input(s): AMMONIA in the last 168 hours. CBC: Recent Labs  Lab 02/06/21 1430 02/07/21 0826 02/08/21 0225 02/10/21 1026 02/10/21 1157  WBC 9.7 9.2 5.5 5.3  --   NEUTROABS  --  8.4*  --   --   --   HGB 14.9 14.3 12.5* 14.0  --   HCT 44.1 42.3 37.8* 41.5  --   MCV 87.7 88.9 90.4 88.3  --   PLT 112* 102* 89* 128* 121*   Cardiac Enzymes: No results for input(s): CKTOTAL, CKMB, CKMBINDEX, TROPONINI in the last 168 hours. BNP (last 3 results) Recent Labs    01/03/21 0825  BNP 16.8    ProBNP (last 3 results) No results for input(s): PROBNP in the last 8760 hours.  CBG: Recent Labs  Lab 02/10/21 2022 02/10/21 2221 02/11/21 0824 02/11/21 1012 02/11/21 1226  GLUCAP 272* 218* 236* 194* 363*        Radiology Reports  No results found.     Antibiotics: Anti-infectives (From admission, onward)    Start     Dose/Rate Route Frequency Ordered Stop   02/06/21 2145  cefTRIAXone (ROCEPHIN) 2 g in sodium chloride 0.9 % 100 mL IVPB        2 g 200 mL/hr over 30 Minutes Intravenous  Once 02/06/21 2132 02/06/21 2246         DVT prophylaxis: SCDs  Code Status: Full code  Family Communication: No family at bedside   Consultants: Gastroenterology  Procedures: Intubation for MRI    Objective    Physical Examination:  General-appears in no acute distress Heart-S1-S2, regular, no murmur auscultated Lungs-clear to auscultation bilaterally, no wheezing or  crackles auscultated Abdomen-soft, nontender, no organomegaly Extremities-no edema in the lower extremities Neuro-alert, oriented x3, no focal deficit noted  Status is: Inpatient  Dispo: The patient is from: Home              Anticipated d/c is to: Home              Anticipated d/c date is: 02/11/2021              Patient currently not stable for discharge  Barrier to discharge-ongoing evaluation for transaminitis  COVID-19 Labs  Recent Labs    02/10/21 1157  DDIMER 0.81*  LDH 153    Lab Results  Component Value Date   SARSCOV2NAA NEGATIVE 02/06/2021   [place] NEGATIVE 01/14/2021   [place] NOT DETECTED 10/04/2020   [place] NOT DETECTED 08/01/2020            Recent Results (from the past 240 hour(s))  Resp Panel by RT-PCR (Flu A&B, Covid) Nasopharyngeal Swab     Status: None   Collection Time: 02/06/21  2:30 PM   Specimen: Nasopharyngeal Swab; Nasopharyngeal(NP) swabs in vial transport medium  Result Value Ref Range Status   SARS Coronavirus 2 by RT PCR NEGATIVE NEGATIVE Final    Comment: (NOTE) SARS-CoV-2 target nucleic acids are NOT DETECTED.  The SARS-CoV-2 RNA is generally detectable in upper respiratory specimens during the acute phase of infection. The  lowest concentration of SARS-CoV-2 viral copies this assay can detect is 138 copies/mL. A negative result does not preclude SARS-Cov-2 infection and should not be used as the sole basis for treatment or other patient management decisions. A negative result may occur with  improper specimen collection/handling, submission of specimen other than nasopharyngeal swab, presence of viral mutation(s) within the areas targeted by this assay, and inadequate number of viral copies(<138 copies/mL). A negative result must be combined with clinical observations, patient history, and epidemiological information. The expected result is Negative.  Fact Sheet for Patients:  EntrepreneurPulse.com.au  Fact Sheet for Healthcare Providers:  IncredibleEmployment.be  This test is no t yet approved or cleared by the [place] FDA and  has been authorized for detection and/or diagnosis of SARS-CoV-2 by FDA under an Emergency Use Authorization (EUA). This EUA will remain  in effect (meaning this test can be used) for the duration of the COVID-19 declaration under Section 564(b)(1) of the Act, 21 U.S.C.section 360bbb-3(b)(1), unless the authorization is terminated  or revoked sooner.       Influenza A by PCR NEGATIVE NEGATIVE Final   Influenza B by PCR NEGATIVE NEGATIVE Final    Comment: (NOTE) The Xpert Xpress SARS-CoV-2/FLU/RSV plus assay is intended as an aid in the diagnosis of influenza from Nasopharyngeal swab specimens and should not be used as a sole basis for treatment. Nasal washings and aspirates are unacceptable for Xpert Xpress SARS-CoV-2/FLU/RSV testing.  Fact Sheet for Patients: EntrepreneurPulse.com.au  Fact Sheet for Healthcare Providers: IncredibleEmployment.be  This test is not yet approved or cleared by the [place] FDA and has been authorized for detection and/or diagnosis of SARS-CoV-2 by FDA under  an Emergency Use Authorization (EUA). This EUA will remain in effect (meaning this test can be used) for the duration of the COVID-19 declaration under Section 564(b)(1) of the Act, 21 U.S.C. section 360bbb-3(b)(1), unless the authorization is terminated or revoked.  Performed at [facility], [address] [place], [place] [address]   MRSA Next Gen by PCR, Nasal     Status: None   Collection  Time: 02/07/21  6:10 AM   Specimen: Nasal Mucosa; Nasal Swab  Result Value Ref Range Status   MRSA by PCR Next Gen NOT DETECTED NOT DETECTED Final    Comment: (NOTE) The GeneXpert MRSA Assay (FDA approved for NASAL specimens only), is one component of a comprehensive MRSA colonization surveillance program. It is not intended to diagnose MRSA infection nor to guide or monitor treatment for MRSA infections. Test performance is not FDA approved in patients less than 77 years old. Performed at [facility], [address], [place], [place] [address]     Meredeth Ide   [facility] If 7PM-7AM, please contact night-coverage at [email or web address], Office  [phone]   02/11/2021, 2:03 PM  LOS: 4 days

## 2021-02-12 ENCOUNTER — Ambulatory Visit: Payer: Medicare Other | Admitting: Nurse Practitioner

## 2021-02-12 ENCOUNTER — Telehealth: Payer: Self-pay | Admitting: *Deleted

## 2021-02-12 ENCOUNTER — Other Ambulatory Visit: Payer: Medicare Other

## 2021-02-12 NOTE — Telephone Encounter (Signed)
Transition Care Management Unsuccessful Follow-up Telephone Call  Date of discharge and from where:  02/11/2021 [place]  Attempts:  1st Attempt  Reason for unsuccessful TCM follow-up call:  Unable to leave message

## 2021-02-13 ENCOUNTER — Other Ambulatory Visit: Payer: Self-pay

## 2021-02-13 DIAGNOSIS — I10 Essential (primary) hypertension: Secondary | ICD-10-CM

## 2021-02-13 DIAGNOSIS — U071 COVID-19: Secondary | ICD-10-CM

## 2021-02-13 DIAGNOSIS — I25118 Atherosclerotic heart disease of native coronary artery with other forms of angina pectoris: Secondary | ICD-10-CM

## 2021-02-13 DIAGNOSIS — J1282 Pneumonia due to coronavirus disease 2019: Secondary | ICD-10-CM

## 2021-02-13 NOTE — Telephone Encounter (Signed)
Transition Care Management Follow-up Telephone Call Date of discharge and from where: 02/11/2021 [place] How have you been since you were released from the hospital? Not too good, still have to follow up with Surgeon to get Gallbladder Removed.  Any questions or concerns? No  Items Reviewed: Did the pt receive and understand the discharge instructions provided? Yes  Medications obtained and verified? Yes  Other? No  Any new allergies since your discharge? No  Dietary orders reviewed? Yes Do you have support at home? Yes   Home Care and Equipment/Supplies: Were home health services ordered? no If so, what is the name of the agency? na  Has the agency set up a time to come to the patient's home? not applicable Were any new equipment or medical supplies ordered?  No What is the name of the medical supply agency? na Were you able to get the supplies/equipment? not applicable Do you have any questions related to the use of the equipment or supplies? No  Functional Questionnaire: (I = Independent and D = Dependent) ADLs: I  Bathing/Dressing- I  Meal Prep- D  Eating- I  Maintaining continence- I  Transferring/Ambulation- I  Managing Meds- I  Follow up appointments reviewed:  PCP Hospital f/u appt confirmed? Yes  Scheduled to see Dinah 02/21/2021 @ 8:30 Specialist Hospital f/u appt confirmed? Yes Scheduled to see Neurology and Surgeon Are transportation arrangements needed? No  If their condition worsens, is the pt aware to call PCP or go to the Emergency Dept.? Yes Was the patient provided with contact information for the PCP's office or ED? Yes Was to pt encouraged to call back with questions or concerns? Yes

## 2021-02-14 ENCOUNTER — Other Ambulatory Visit: Payer: Self-pay | Admitting: *Deleted

## 2021-02-14 NOTE — Patient Outreach (Signed)
[facility]) Care Management  02/14/2021  Lajarvis Italiano Sr. 30-Aug-1950 [id number]   Referral Date: 12/13 Referral Source: Hospital liaison Referral Reason: Post hospital discharge follow up Insurance: Traditional Medicare   Outreach attempt #1, unsuccessful, HIPAA compliant voice message left.   Plan: RN CM will send outreach letter and follow up within the next 3-4 business days.  [name], [place], MSN [facility] Care Management  [facility] Manager [phone]

## 2021-02-19 ENCOUNTER — Telehealth: Payer: Self-pay | Admitting: Cardiology

## 2021-02-19 NOTE — Telephone Encounter (Signed)
Rescheduled his appointment for stress test to 03/19/2021 at 10:45 AM due to being hospitalized recently.

## 2021-02-20 ENCOUNTER — Other Ambulatory Visit: Payer: Self-pay | Admitting: *Deleted

## 2021-02-20 NOTE — Patient Outreach (Signed)
[facility]) Care Management  02/20/2021  [place] Chelf Sr. November 18, 1950 [id number]    Referral Date: 12/13 Referral Source: Hospital liaison Referral Reason: Post hospital discharge follow up Insurance: Traditional Medicare   Outreach attempt #2, unsuccessful, HIPAA compliant voice message left.   Plan: RN CM will follow up within the next 3-4 business days.  [name], [place], MSN [facility] Care Management  [facility] Manager [phone]

## 2021-02-21 ENCOUNTER — Ambulatory Visit (INDEPENDENT_AMBULATORY_CARE_PROVIDER_SITE_OTHER): Payer: Medicare Other | Admitting: Family

## 2021-02-21 ENCOUNTER — Other Ambulatory Visit: Payer: Self-pay

## 2021-02-21 ENCOUNTER — Encounter: Payer: Self-pay | Admitting: Family

## 2021-02-21 VITALS — BP 120/70 | HR 71 | Temp 97.1°F | Resp 16 | Ht 68.0 in | Wt 196.8 lb

## 2021-02-21 DIAGNOSIS — D696 Thrombocytopenia, unspecified: Secondary | ICD-10-CM | POA: Diagnosis not present

## 2021-02-21 DIAGNOSIS — I10 Essential (primary) hypertension: Secondary | ICD-10-CM

## 2021-02-21 DIAGNOSIS — R0609 Other forms of dyspnea: Secondary | ICD-10-CM | POA: Diagnosis not present

## 2021-02-21 DIAGNOSIS — G2581 Restless legs syndrome: Secondary | ICD-10-CM

## 2021-02-21 DIAGNOSIS — E7800 Pure hypercholesterolemia, unspecified: Secondary | ICD-10-CM | POA: Diagnosis not present

## 2021-02-21 DIAGNOSIS — R7989 Other specified abnormal findings of blood chemistry: Secondary | ICD-10-CM | POA: Diagnosis not present

## 2021-02-21 DIAGNOSIS — K219 Gastro-esophageal reflux disease without esophagitis: Secondary | ICD-10-CM

## 2021-02-21 LAB — COMPLETE METABOLIC PANEL WITH GFR
AG Ratio: 1.6 (calc) (ref 1.0–2.5)
ALT: 37 U/L (ref 9–46)
AST: 22 U/L (ref 10–35)
Albumin: 3.9 g/dL (ref 3.6–5.1)
Alkaline phosphatase (APISO): 62 U/L (ref 35–144)
BUN: 20 mg/dL (ref 7–25)
CO2: 27 mmol/L (ref 20–32)
Calcium: 9.3 mg/dL (ref 8.6–10.3)
Chloride: 102 mmol/L (ref 98–110)
Creat: 1.19 mg/dL (ref 0.70–1.28)
Globulin: 2.4 g/dL (calc) (ref 1.9–3.7)
Glucose, Bld: 263 mg/dL — ABNORMAL HIGH (ref 65–99)
Potassium: 4.9 mmol/L (ref 3.5–5.3)
Sodium: 136 mmol/L (ref 135–146)
Total Bilirubin: 0.6 mg/dL (ref 0.2–1.2)
Total Protein: 6.3 g/dL (ref 6.1–8.1)
eGFR: 65 mL/min/{1.73_m2} (ref >=60)

## 2021-02-21 LAB — CBC WITH DIFFERENTIAL/PLATELET
Absolute Monocytes: 441 cells/uL (ref 200–950)
Basophils Absolute: 29 cells/uL (ref 0–200)
Basophils Relative: 0.5 %
Eosinophils Absolute: 93 cells/uL (ref 15–500)
Eosinophils Relative: 1.6 %
HCT: 39.3 % (ref 38.5–50.0)
Hemoglobin: 13.3 g/dL (ref 13.2–17.1)
Lymphs Abs: 1224 cells/uL (ref 850–3900)
MCH: 29.8 pg (ref 27.0–33.0)
MCHC: 33.8 g/dL (ref 32.0–36.0)
MCV: 87.9 fL (ref 80.0–100.0)
MPV: 11 fL (ref 7.5–12.5)
Monocytes Relative: 7.6 %
Neutro Abs: 4014 cells/uL (ref 1500–7800)
Neutrophils Relative %: 69.2 %
Platelets: 182 10*3/uL (ref 140–400)
RBC: 4.47 10*6/uL (ref 4.20–5.80)
RDW: 12.1 % (ref 11.0–15.0)
Total Lymphocyte: 21.1 %
WBC: 5.8 10*3/uL (ref 3.8–10.8)

## 2021-02-21 NOTE — Progress Notes (Signed)
Provider: Gavon Majano FNP-C  Lauree Chandler, NP  Patient Care Team: Lauree Chandler, NP as PCP - General (Geriatric Medicine) Adrian Prows, MD as Consulting Physician (Cardiology) Cameron Sprang, MD as Consulting Physician (Neurology) [place], [name], Hazel (Optometry) Melida Quitter, MD as Consulting Physician (Otolaryngology) Valente David, RN as [place] [facility]  Extended Emergency Contact Information Primary Emergency Contact: Daggs,Pam Address: [address]          [place], [place] [address] Johnnette Litter of [place] Phone: [phone] Relation: Spouse Secondary Emergency Contact: [place] Phone: [phone] Relation: Son  Code Status:  Full Code  Goals of care: Advanced Directive information Advanced Directives 02/21/2021  Does Patient Have a Medical Advance Directive? No  Does patient want to make changes to medical advance directive? -  Would patient like information on creating a medical advance directive? No - Patient declined  Pre-existing out of facility DNR order (yellow form or pink MOST form) -     Chief Complaint  Patient presents with   Transitions Of Care    [facility] 02/06/2021-02/11/2021    HPI:  Pt is a 70 y.o. male seen today for an acute visit for transition of care post hospitalization from 02/06/2021 - 02/11/2021 for abdominal pain.Had epigastric pain for 3 months.pain radiated to chest and worst with exertion.Cardiology was consulted and was cleared for any GI procedures.Gastroenterology consulted.patient underwent MRCP under general anesthesia due to lower extremities myoclonic jerks from RLS. AST 270,ALT 451 Bilirubin 5.7 trended down to AST 53,ALT 129 Bilirubin 1.4  He denies any abdominal pain.bowel moving without any difficulties no nausea or vomiting. Still has shortness of breath with exertion.No cough.has stress test on 03/19/2021. He denies chest pain though has a spell when  having difficulty breathing.  PLTs were low.states has to call Hematology oncologist to schedule for appointment.  Also will call Surgeon for Gall bladder    Past Medical History:  Diagnosis Date   Arthritis    "left thumb" (05/23/2017) right shoulder   Barrett's esophagus    "we've been told that it's all gone; still take RX for GERD" (05/23/2017)   Bilateral swelling of feet and ankles x 3 weeks as of 12-01-2019   CAD (coronary artery disease), native coronary artery    Coronary angiogram 05/03/2014:  Proximal LAD 3.0x12 mm Promus premier stent.  04/08/2014: Mid Cx 3.5 x 16 mm Promus DES.  03/29/2013: Mid LAD 2.75 x 38 mm Promus Premier drug-eluting stent, balloon angioplasty of D1 and distal LAD and stenting of distal RCA with 2.75 x 24 mm promos Premier drug-eluting stent 12/15/2012 widely patent.   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  04/08/2014: Mid Cx 3.5 x  16 mm Promus DES.  03/29/2013: Mid LAD 2.75 x 38 mm Promus Premier drug-eluting stent, balloon angioplasty of D1 and distal LAD and stenting of distal RCA with 2.75 x 24 mm promos Premier drug-eluting stent 12/15/2012 widely patent.   Past Surgical History:  Procedure Laterality Date   BICEPT TENODESIS Right 12/07/2019   Procedure: RIGHT BICEPS TENODESIS;  Surgeon: Renette Butters, MD;  Location: [facility];  Service: Orthopedics;  Laterality: Right;   CARDIAC CATHETERIZATION N/A 09/25/2015   Procedure: Left Heart Cath and Coronary Angiography;  Surgeon: Adrian Prows, MD;  Location: [place] [facility];  Service: Cardiovascular;  Laterality: N/A;   CARDIAC CATHETERIZATION N/A 09/25/2015   Procedure: Intravascular Pressure Wire/FFR Study;  Surgeon: Adrian Prows, MD;  Location: [facility];  Service: Cardiovascular;  Laterality: N/A;   CORONARY ANGIOPLASTY WITH STENT PLACEMENT  12/15/2012; 04/28/2014; 05/03/2014   "2; 1; 1" , 4 stents 2 balloons   FRACTIONAL FLOW RESERVE WIRE Right 03/26/2013   Procedure: FRACTIONAL FLOW RESERVE WIRE;  Surgeon: Laverda Page, MD;  Location: [facility] C[facility] LAB;  Service: Cardiovascular;  Laterality: Right;   FRACTIONAL FLOW RESERVE WIRE N/A 05/03/2014   Procedure: FRACTIONAL FLOW RESERVE WIRE;  Surgeon: Laverda Page, MD;  [facility];  Service: Cardiovascular;  Laterality: N/A;   HERNIA REPAIR  1324   "umbilical"    INCISION AND DRAINAGE ABSCESS Left 05/2007   "groin"    KNEE SURGERY Right 1992;  2000   "calcium deposits removed" (12/15/2012)   LEFT HEART CATH AND CORONARY ANGIOGRAPHY N/A 05/23/2017   Procedure: LEFT HEART CATH AND CORONARY ANGIOGRAPHY;  Surgeon: Adrian Prows, MD;  Location: [place] [facility];  Service: Cardiovascular;  Laterality: N/A;   LEFT HEART CATHETERIZATION WITH CORONARY ANGIOGRAM N/A 12/15/2012   Procedure: LEFT HEART CATHETERIZATION WITH CORONARY ANGIOGRAM;  Surgeon: Laverda Page, MD;  Location: [facility] C[facility] LAB;   Service: Cardiovascular;  Laterality: N/A;   LEFT HEART CATHETERIZATION WITH CORONARY ANGIOGRAM N/A 03/26/2013   Procedure: LEFT HEART CATHETERIZATION WITH CORONARY ANGIOGRAM;  Surgeon: Laverda Page, MD;  Location: [facility] CATH LAB;  Service: Cardiovascular;  Laterality: N/A;   LEFT HEART CATHETERIZATION WITH CORONARY ANGIOGRAM N/A 04/08/2014   Procedure: LEFT HEART CATHETERIZATION WITH CORONARY ANGIOGRAM;  Surgeon: Blane Ohara, MD;  Location: [facility] C[facility] LAB;  Service: Cardiovascular;  Laterality: N/A;   PERCUTANEOUS CORONARY STENT INTERVENTION (PCI-S) N/A 05/03/2014   Procedure: PERCUTANEOUS CORONARY STENT INTERVENTION (PCI-S);  Surgeon: Laverda Page, MD;  Location: [facility] CATH LAB;  Service: Cardiovascular;  Laterality: N/A;   RADIOLOGY WITH ANESTHESIA N/A 02/08/2021   Procedure: MRI WITH ANESTHESIA OF ABDOMEN WITH AND WITHOUT CONSTRAST;  Surgeon: Radiologist, Medication, MD;  Location: [place];  Service: Radiology;  Laterality: N/A;   ROTATOR CUFF REPAIR     UMBILICAL HERNIA REPAIR  yrs ago    Allergies  Allergen Reactions   Lyrica [Pregabalin] Other (See Comments)    Made patient very lethargic  the next morning, very hard to patient to function, move, etc.    Statins Other (See Comments)    Causes Restless Legs, rhabdomyolysis   Ativan [Lorazepam] Other (See Comments)    Markedly increased restless legs   Trulicity [Dulaglutide] Other (See Comments)    GI side effects     Outpatient Encounter Medications as of 02/21/2021  Medication Sig   acetaminophen (TYLENOL) 500 MG tablet Take 500 mg by mouth every 6 (six) hours as needed for mild pain, moderate pain, headache or fever.   albuterol (PROVENTIL HFA;VENTOLIN HFA) 108 (90 Base) MCG/ACT inhaler Inhale 2 puffs into the lungs every 6 (six) hours as needed for wheezing or shortness of breath.   Alirocumab (PRALUENT) 150 MG/ML SOAJ Inject 1 pen into the skin every 14 (fourteen) days.   BD PEN NEEDLE NANO 2ND GEN 32G X 4 MM MISC USE AS  DIRECTED   Cholecalciferol (D-5000) 125 MCG (5000 UT) TABS Take 1 tablet (5,000 Units total) by mouth daily. E55.9   Continuous Blood Gluc Receiver (FREESTYLE LIBRE 14 DAY READER) DEVI Inject 1 Device as directed daily as needed. Use once daily as direct to check blood sugar E11.51   Continuous Blood Gluc Sensor (FREESTYLE LIBRE 14 DAY SENSOR) MISC Use to test blood sugar three times daily. E11.51. E11.59   dapagliflozin propanediol (FARXIGA) 10 MG TABS tablet Take 1 tablet (10 mg total) by mouth daily before breakfast.   famotidine (PEPCID) 20 MG tablet TAKE 1 TABLET BY MOUTH EVERY DAY   fluticasone (FLONASE) 50 MCG/ACT nasal spray Place 1 spray into both nostrils daily as needed for allergies or rhinitis.   gabapentin (NEURONTIN) 100 MG capsule Take 2 capsules (200 mg total) by mouth at bedtime.   glucose 4 GM chewable tablet Chew 1 tablet (4 g total) by mouth as needed for low blood sugar.   insulin aspart (NOVOLOG FLEXPEN) 100 UNIT/ML FlexPen Inject 15-20 Units into the skin 3 (three) times daily with meals.   Insulin Glargine (BASAGLAR KWIKPEN) 100 UNIT/ML Inject 40 units subcutaneously into the skin at bedtime   LIFESCAN FINEPOINT LANCETS MISC Use to test for blood sugar three times daily dx: E11.51   metoprolol succinate (TOPROL-XL) 25 MG 24 hr tablet Take 0.5 tablets (12.5 mg total) by mouth daily. Take with or immediately following a meal.   nitroGLYCERIN (NITROSTAT) 0.4 MG SL tablet Place 1 tablet (0.4 mg total) under the tongue every 5 (five) minutes as needed for chest pain.   ONETOUCH ULTRA test strip USE TO TEST FOR BLOOD SUGAR THREE TIMES DAILY DX: E11.51   pantoprazole (PROTONIX) 40 MG tablet Take one tablet by mouth once daily for stomach   pramipexole (MIRAPEX) 1 MG tablet TAKE 1 TABLET BY MOUTH AT 5PM, 1 TABLET BY MOUTH AT 7PM, AND TAKE 2 TABLETS BY MOUTH AT BEDTIME.   valsartan (DIOVAN) 160 MG tablet Take 1 tablet (160 mg total) by mouth every evening.   No  facility-administered encounter medications on file as of 02/21/2021.    Review of Systems  Constitutional:  Negative for appetite change, chills, fatigue, fever and unexpected weight change.  HENT:  Negative for congestion, dental problem, ear discharge, ear pain, facial swelling, hearing loss, nosebleeds, postnasal drip, rhinorrhea, sinus pressure, sinus pain, sneezing, sore throat, tinnitus and trouble swallowing.   Eyes:  Negative for pain, discharge, redness, itching and visual disturbance.  Respiratory:  Negative for cough, chest tightness and wheezing.        Shortness of breath with  exertion   Cardiovascular:  Negative for chest pain, palpitations and leg swelling.  Gastrointestinal:  Negative for abdominal distention, abdominal pain, blood in stool, constipation, diarrhea, nausea and vomiting.  Endocrine: Negative for cold intolerance, heat intolerance, polydipsia, polyphagia and polyuria.  Genitourinary:  Negative for difficulty urinating, dysuria, flank pain, frequency and urgency.  Musculoskeletal:  Negative for arthralgias, back pain, gait problem, joint swelling, myalgias, neck pain and neck stiffness.  Skin:  Negative for color change, pallor, rash and wound.  Neurological:  Negative for dizziness, syncope, speech difficulty, weakness, light-headedness, numbness and headaches.  Hematological:  Does not bruise/bleed easily.  Psychiatric/Behavioral:  Negative for agitation, behavioral problems, confusion, hallucinations, self-injury, sleep disturbance and suicidal ideas. The patient is not nervous/anxious.    Immunization History  Administered Date(s) Administered   Pneumococcal Conjugate-13 12/01/2013   Td 03/04/1998, 03/01/2017   Tdap 03/06/2011, 02/28/2017   Pertinent  Health Maintenance Due  Topic Date Due   INFLUENZA VACCINE  10/02/2020   HEMOGLOBIN A1C  07/15/2021   OPHTHALMOLOGY EXAM  09/05/2021   FOOT EXAM  12/25/2021   COLONOSCOPY (Pts 45-59yr Insurance coverage  will need to be confirmed)  10/18/2025   Fall Risk 02/09/2021 02/10/2021 02/10/2021 02/11/2021 02/21/2021  Falls in the past year? - - - - 0  Number of falls in past year - - - - -  Was there an injury with Fall? - - - - 0  Was there an injury with Fall? - - - - -  Fall Risk Category Calculator - - - - 0  Fall Risk Category - - - - Low  Patient Fall Risk Level Low fall risk Low fall risk Moderate fall risk Low fall risk Low fall risk  Patient at Risk for Falls Due to - - - - No Fall Risks  Patient at Risk for Falls Due to - - - - -  Fall risk Follow up - - - - Falls evaluation completed   Functional Status Survey:    Vitals:   02/21/21 0831  BP: (!) 150/80  Pulse: 71  Resp: 16  Temp: (!) 97.1 F (36.2 C)  SpO2: 98%  Weight: 196 lb 12.8 oz (89.3 kg)  Height: _0  (1.727 m)   Body mass index is 29.92 kg/m. Physical Exam Vitals reviewed.  Constitutional:      General: He is not in acute distress.    Appearance: Normal appearance. He is normal weight. He is not ill-appearing or diaphoretic.  HENT:     Head: Normocephalic.     Right Ear: Tympanic membrane, ear canal and external ear normal. There is no impacted cerumen.     Left Ear: Tympanic membrane, ear canal and external ear normal. There is no impacted cerumen.     Nose: Nose normal. No congestion or rhinorrhea.     Mouth/Throat:     Mouth: Mucous membranes are moist.     Pharynx: Oropharynx is clear. No oropharyngeal exudate or posterior oropharyngeal erythema.  Eyes:     General: No scleral icterus.       Right eye: No discharge.        Left eye: No discharge.     Extraocular Movements: Extraocular movements intact.     Conjunctiva/sclera: Conjunctivae normal.     Pupils: Pupils are equal, round, and reactive to light.  Neck:     Vascular: No carotid bruit.  Cardiovascular:     Rate and Rhythm: Normal rate and regular rhythm.     Pulses: Normal  pulses.     Heart sounds: Normal heart sounds. No murmur heard.    No friction rub. No gallop.  Pulmonary:     Effort: Pulmonary effort is normal. No respiratory distress.     Breath sounds: Normal breath sounds. No wheezing, rhonchi or rales.  Chest:     Chest wall: No tenderness.  Abdominal:     General: Bowel sounds are normal. There is no distension.     Palpations: Abdomen is soft. There is no mass.     Tenderness: There is no abdominal tenderness. There is no right CVA tenderness, left CVA tenderness, guarding or rebound.  Musculoskeletal:        General: No swelling or tenderness. Normal range of motion.     Cervical back: Normal range of motion. No rigidity or tenderness.     Comments: Bilateral lower extremities 1+ edema   Lymphadenopathy:     Cervical: No cervical adenopathy.  Skin:    General: Skin is warm and dry.     Coloration: Skin is not pale.     Findings: No bruising, erythema, lesion or rash.  Neurological:     Mental Status: He is alert and oriented to person, place, and time.     Cranial Nerves: No cranial nerve deficit.     Sensory: No sensory deficit.     Motor: No weakness.     Coordination: Coordination normal.     Gait: Gait normal.  Psychiatric:        Mood and Affect: Mood normal.        Speech: Speech normal.        Behavior: Behavior normal.        Thought Content: Thought content normal.        Judgment: Judgment normal.    Labs reviewed: Recent Labs    02/09/21 0240 02/10/21 1026 02/11/21 1137  NA 132* 132* 133*  K 5.1 4.2 4.5  CL 98 100 100  CO2 _0 GLUCOSE 327* 215* 260*  BUN 25* 19 17  CREATININE 1.52* 1.29* 1.02  CALCIUM 8.4* 9.0 9.1   Recent Labs    02/09/21 0240 02/10/21 1026 02/11/21 1137  AST 74* 52* 53*  ALT 194* 151* 129*  ALKPHOS 84 87 101  BILITOT 1.9* 1.8* 1.4*  PROT 5.6* 6.4* 6.7  ALBUMIN 2.9* 3.2* 3.5   Recent Labs    06/30/20 0808 01/14/21 1146 01/14/21 1152 02/07/21 0826 02/08/21 0225 02/10/21 1026 02/10/21 1157  WBC 5.8 5.6   < > 9.2 5.5 5.3  --    NEUTROABS 3,985 3.9  --  8.4*  --   --   --   HGB 15.0 14.2   < > 14.3 12.5* 14.0  --   HCT 44.3 42.5   < > 42.3 37.8* 41.5  --   MCV 86.0 88.0   < > 88.9 90.4 88.3  --   PLT 131* 131*   < > 102* 89* 128* 121*   < > = values in this interval not displayed.   Lab Results  Component Value Date   TSH 2.684 01/15/2021   Lab Results  Component Value Date   HGBA1C 7.9 (H) 01/15/2021   Lab Results  Component Value Date   CHOL 81 01/15/2021   HDL 31 (L) 01/15/2021   LDLCALC 15 01/15/2021   LDLDIRECT 110 (H) 06/17/2019   TRIG 174 (H) 01/15/2021   CHOLHDL 2.6 01/15/2021    Significant Diagnostic Results in last 30 days:  DG Chest 2 View  Result Date: 02/01/2021 CLINICAL DATA:  Chest pain. EXAM: CHEST - 2 VIEW COMPARISON:  Chest x-ray 05/04/2019. FINDINGS: The heart size and mediastinal contours are within normal limits. Both lungs are clear. The visualized skeletal structures are unremarkable. IMPRESSION: No active cardiopulmonary disease. Electronically Signed   By: Ronney Asters M.D.   On: 02/01/2021 19:07   DG Chest 2 View  Result Date: 01/31/2021 CLINICAL DATA:  Chest pain mild shortness of breath EXAM: CHEST - 2 VIEW COMPARISON:  Chest radiograph 05/04/2019 FINDINGS: The cardiomediastinal silhouette is normal. There is no focal consolidation or pulmonary edema. There is no pleural effusion or pneumothorax. There is no acute osseous abnormality. IMPRESSION: No radiographic evidence of acute cardiopulmonary process. Electronically Signed   By: Valetta Mole M.D.   On: 01/31/2021 10:10   CT Angio Chest Pulmonary Embolism (PE) W or WO Contrast  Result Date: 02/11/2021 CLINICAL DATA:  Pulmonary embolism suspected. EXAM: CT ANGIOGRAPHY CHEST WITH CONTRAST TECHNIQUE: Multidetector CT imaging of the chest was performed using the standard protocol during bolus administration of intravenous contrast. Multiplanar CT image reconstructions and MIPs were obtained to evaluate the vascular anatomy.  CONTRAST:  65m OMNIPAQUE IOHEXOL 350 MG/ML SOLN COMPARISON:  78 mL of Omnipaque 350 FINDINGS: Cardiovascular: Satisfactory opacification of the pulmonary arteries to the segmental level. No evidence of pulmonary embolism. Normal heart size. No pericardial effusion. Coronary artery atherosclerotic calcifications. Mediastinum/Nodes: No enlarged mediastinal, hilar, or axillary lymph nodes. Thyroid gland, trachea, and esophagus demonstrate no significant findings. Lungs/Pleura: Evaluation of lung parenchyma is limited due to motion. No definite focal consolidation. No pleural effusion or pneumothorax. Upper Abdomen: No acute abnormality. Musculoskeletal: No chest wall abnormality. No acute or significant osseous findings. Review of the MIP images confirms the above findings. IMPRESSION: 1. No evidence of pulmonary embolism or acute cardiopulmonary process. 2. No evidence of pneumonia or pulmonary edema. Evaluation of lung parenchyma is limited due to respiratory motion. Electronically Signed   By: IKeane PoliceD.O.   On: 02/11/2021 16:55   NM Hepatobiliary Liver Func  Result Date: 02/09/2021 CLINICAL DATA:  Right upper quadrant abdominal pain, nondiagnostic ultrasound EXAM: NUCLEAR MEDICINE HEPATOBILIARY IMAGING TECHNIQUE: Sequential images of the abdomen were obtained out to 60 minutes following intravenous administration of radiopharmaceutical. RADIOPHARMACEUTICALS:  7.3 mCi Tc-942mCholetec IV COMPARISON:  MR abdomen, 02/08/2021 FINDINGS: Prompt uptake and biliary excretion of activity by the liver is seen. Gallbladder activity is visualized, consistent with patency of cystic duct. Biliary activity passes into small bowel, consistent with patent common bile duct. IMPRESSION: 1.  Cystic duct is patent. 2.  Common bile duct is patent. Electronically Signed   By: AlDelanna Ahmadi.D.   On: 02/09/2021 14:00   CT ABDOMEN PELVIS W CONTRAST  Result Date: 02/06/2021 CLINICAL DATA:  Abdominal pain, fever.  History of  hernia repair EXAM: CT ABDOMEN AND PELVIS WITH CONTRAST TECHNIQUE: Multidetector CT imaging of the abdomen and pelvis was performed using the standard protocol following bolus administration of intravenous contrast. CONTRAST:  10038mMNIPAQUE IOHEXOL 300 MG/ML  SOLN COMPARISON:  CT abdomen pelvis 04/20/2018 FINDINGS: Lower chest: No acute abnormality. Hepatobiliary: The hepatic parenchyma is diffusely mildly hypodense compared to the splenic parenchyma consistent with fatty infiltration. No focal liver abnormality. Gallstone noted within the gallbladder lumen. No gallbladder wall thickening or pericholecystic fluid. No biliary dilatation. Pancreas: No focal lesion. Normal pancreatic contour. No surrounding inflammatory changes. No main pancreatic ductal dilatation. Spleen: Normal in size without focal abnormality. Adrenals/Urinary  Tract: No adrenal nodule bilaterally. Bilateral kidneys enhance symmetrically. There is a 1.4 cm left renal lesion with a density of 25 Hounsfield units. No hydronephrosis. No hydroureter. The urinary bladder is unremarkable. On delayed imaging, there is no urothelial wall thickening and there are no filling defects in the opacified portions of the bilateral collecting systems or ureters. Stomach/Bowel: Stomach is within normal limits. No evidence of bowel wall thickening or dilatation. Appendix appears normal. Vascular/Lymphatic: No abdominal aorta or iliac aneurysm. Moderate to severe atherosclerotic plaque of the aorta and its branches. No abdominal, pelvic, or inguinal lymphadenopathy. Reproductive: Prostate is unremarkable. Other: No intraperitoneal free fluid. No intraperitoneal free gas. No organized fluid collection. Musculoskeletal: No abdominal wall hernia or abnormality. No suspicious lytic or blastic osseous lesions. No acute displaced fracture. Multilevel degenerative changes of the spine. IMPRESSION: 1. Cholelithiasis with no CT findings of acute cholecystitis. 2. Possible  mild hepatic steatosis. 3. Indeterminate 1.4 cm left renal lesion. Recommend MRI renal protocol for further evaluation. 4.  Aortic Atherosclerosis (ICD10-I70.0). Electronically Signed   By: Iven Finn M.D.   On: 02/06/2021 19:40   MR 3D Recon At Scanner  Result Date: 02/08/2021 CLINICAL DATA:  Right upper quadrant abdominal pain. Stage 3 chronic kidney disease. Epigastric pain. Diabetes. Indeterminate left renal lesion on recent CT scan EXAM: MRI ABDOMEN WITHOUT AND WITH CONTRAST (INCLUDING MRCP) TECHNIQUE: Multiplanar multisequence MR imaging of the abdomen was performed both before and after the administration of intravenous contrast. Heavily T2-weighted images of the biliary and pancreatic ducts were obtained, and three-dimensional MRCP images were rendered by post processing. CONTRAST:  54m GADAVIST GADOBUTROL 1 MMOL/ML IV SOLN COMPARISON:  CT abdomen and ultrasound abdomen 02/06/2021 FINDINGS: Lower chest: Dependent reticulation in the lower lobes greater than that shown on recent CT, suspicious for potential dependent interstitial edema and/or subsegmental atelectasis. Hepatobiliary: Diffuse hepatic steatosis. Sludge and small gallstones in the gallbladder. Mild perihepatic fluid or gallbladder wall thickening along the margin with the liver. No biliary dilatation. No filling defect in the common bile duct or common hepatic duct. Trace nonspecific periportal edema. Diffuse micronodular heterogeneous enhancement of the liver on arterial phase images but subsequent normal hepatic enhancement on portal venous phase and delayed images. Pancreas:  Unremarkable Spleen:  No splenomegaly or focal splenic lesion identified. Adrenals/Urinary Tract:  Both adrenal glands appear normal. Bosniak category 2 cyst of the left kidney lower pole measuring 1.4 by 1.2 by 1.5 cm, with high T1 and low T2 signal density component posteriorly. This lesion does not enhance. Adrenal glands unremarkable. Stomach/Bowel: Bilobed  periampullary duodenal diverticulum. No dilated bowel. Vascular/Lymphatic: Atherosclerosis is present, including aortoiliac atherosclerotic disease. There is some atherosclerotic plaque along the origins of the SMA and celiac trunk. Other:  No supplemental non-categorized findings. Musculoskeletal: Unremarkable IMPRESSION: 1. Cholelithiasis with gallstones. Trace perihepatic fluid or gallbladder wall thickening along the margin with the liver. No biliary dilatation. 2. Increased (from 02/06/2021) reticulation and faint enhancement dependently in both lower lobes, query interstitial edema and/or atelectasis. 3. Trace nonspecific periportal edema. 4. Diffuse hepatic steatosis. Mildly heterogeneous arterial phase enhancement in the liver, I would suggest hepatitis panel to exclude the unlikely possibility of hepatitis. Later phase liver imaging appears normal. 5. Bosniak category 2 benign cyst of the left kidney lower pole (benign). 6. Atherosclerosis.  Aortic Atherosclerosis (ICD10-I70.0). Electronically Signed   By: WVan ClinesM.D.   On: 02/08/2021 17:26   [place]Abdomen Limited  Result Date: 02/06/2021 CLINICAL DATA:  Right upper quadrant pain EXAM: ULTRASOUND  ABDOMEN LIMITED RIGHT UPPER QUADRANT COMPARISON:  CT 04/20/2018 FINDINGS: Gallbladder: No gallstones or wall thickening visualized. No sonographic Murphy sign noted by sonographer. Common bile duct: Diameter: 5.6 mm Liver: Diffusely echogenic. Suspicion of surface nodularity portal vein is patent on color Doppler imaging with normal direction of blood flow towards the liver. Other: None. IMPRESSION: 1. Negative for gallstones. 2. Echogenic liver consistent with hepatic steatosis and or hepatocellular disease. Possible subtle contour nodularity as may be seen with cirrhosis Electronically Signed   By: Donavan Foil M.D.   On: 02/06/2021 17:38   MR ABDOMEN MRCP W WO CONTAST  Result Date: 02/08/2021 CLINICAL DATA:  Right upper quadrant abdominal pain.  Stage 3 chronic kidney disease. Epigastric pain. Diabetes. Indeterminate left renal lesion on recent CT scan EXAM: MRI ABDOMEN WITHOUT AND WITH CONTRAST (INCLUDING MRCP) TECHNIQUE: Multiplanar multisequence MR imaging of the abdomen was performed both before and after the administration of intravenous contrast. Heavily T2-weighted images of the biliary and pancreatic ducts were obtained, and three-dimensional MRCP images were rendered by post processing. CONTRAST:  69m GADAVIST GADOBUTROL 1 MMOL/ML IV SOLN COMPARISON:  CT abdomen and ultrasound abdomen 02/06/2021 FINDINGS: Lower chest: Dependent reticulation in the lower lobes greater than that shown on recent CT, suspicious for potential dependent interstitial edema and/or subsegmental atelectasis. Hepatobiliary: Diffuse hepatic steatosis. Sludge and small gallstones in the gallbladder. Mild perihepatic fluid or gallbladder wall thickening along the margin with the liver. No biliary dilatation. No filling defect in the common bile duct or common hepatic duct. Trace nonspecific periportal edema. Diffuse micronodular heterogeneous enhancement of the liver on arterial phase images but subsequent normal hepatic enhancement on portal venous phase and delayed images. Pancreas:  Unremarkable Spleen:  No splenomegaly or focal splenic lesion identified. Adrenals/Urinary Tract:  Both adrenal glands appear normal. Bosniak category 2 cyst of the left kidney lower pole measuring 1.4 by 1.2 by 1.5 cm, with high T1 and low T2 signal density component posteriorly. This lesion does not enhance. Adrenal glands unremarkable. Stomach/Bowel: Bilobed periampullary duodenal diverticulum. No dilated bowel. Vascular/Lymphatic: Atherosclerosis is present, including aortoiliac atherosclerotic disease. There is some atherosclerotic plaque along the origins of the SMA and celiac trunk. Other:  No supplemental non-categorized findings. Musculoskeletal: Unremarkable IMPRESSION: 1. Cholelithiasis  with gallstones. Trace perihepatic fluid or gallbladder wall thickening along the margin with the liver. No biliary dilatation. 2. Increased (from 02/06/2021) reticulation and faint enhancement dependently in both lower lobes, query interstitial edema and/or atelectasis. 3. Trace nonspecific periportal edema. 4. Diffuse hepatic steatosis. Mildly heterogeneous arterial phase enhancement in the liver, I would suggest hepatitis panel to exclude the unlikely possibility of hepatitis. Later phase liver imaging appears normal. 5. Bosniak category 2 benign cyst of the left kidney lower pole (benign). 6. Atherosclerosis.  Aortic Atherosclerosis (ICD10-I70.0). Electronically Signed   By: WVan ClinesM.D.   On: 02/08/2021 17:26   VAS [place]OWER EXTREMITY VENOUS (DVT)  Result Date: 02/11/2021  Lower Venous DVT Study Patient Name:  RLyndle PangSr.  Date of Exam:   02/11/2021 Medical Rec #: [id number]      Accession #:    [id number] of Birth: [birth year]      Patient Gender: M Patient Age:   89years [facility]      VAS [place]OWER EXTREMITY VENOUS (DVT) Referring Phys: GFrederich ChickLAMA --------------------------------------------------------------------------------  Indications: Elevated d-dimer.  Comparison Study: No prior study Performing Technologist: MMaudry MayhewMHA, RDMS, RVT, RDCS  Examination Guidelines: A  complete evaluation includes B-mode imaging, spectral Doppler, color Doppler, and power Doppler as needed of all accessible portions of each vessel. Bilateral testing is considered an integral part of a complete examination. Limited examinations for reoccurring indications may be performed as noted. The reflux portion of the exam is performed with the patient in reverse Trendelenburg.  +---------+---------------+---------+-----------+-------------+--------------+  RIGHT     Compressibility Phasicity Spontaneity Properties    Thrombus Aging   +---------+---------------+---------+-----------+-------------+--------------+  CFV       Full            Yes       Yes                                       +---------+---------------+---------+-----------+-------------+--------------+  SFJ       Full                                                                +---------+---------------+---------+-----------+-------------+--------------+  FV Prox   Full                                                                +---------+---------------+---------+-----------+-------------+--------------+  FV Mid    Full                                                                +---------+---------------+---------+-----------+-------------+--------------+  FV Distal Full                                                                +---------+---------------+---------+-----------+-------------+--------------+  PFV       Full                                                                +---------+---------------+---------+-----------+-------------+--------------+  POP       Full            Yes       Yes         Rouleaux flow                 +---------+---------------+---------+-----------+-------------+--------------+  PTV       Full                                                                +---------+---------------+---------+-----------+-------------+--------------+  PERO      Full                                                                +---------+---------------+---------+-----------+-------------+--------------+   +---------+---------------+---------+-----------+----------+--------------+  LEFT      Compressibility Phasicity Spontaneity Properties Thrombus Aging  +---------+---------------+---------+-----------+----------+--------------+  CFV       Full            Yes       Yes                                    +---------+---------------+---------+-----------+----------+--------------+  SFJ       Full                                                              +---------+---------------+---------+-----------+----------+--------------+  FV Prox   Full                                                             +---------+---------------+---------+-----------+----------+--------------+  FV Mid    Full                                                             +---------+---------------+---------+-----------+----------+--------------+  FV Distal Full                                                             +---------+---------------+---------+-----------+----------+--------------+  PFV       Full                                                             +---------+---------------+---------+-----------+----------+--------------+  POP       Full            Yes       Yes                                    +---------+---------------+---------+-----------+----------+--------------+  PTV       Partial                                                          +---------+---------------+---------+-----------+----------+--------------+  PERO      Full                                                             +---------+---------------+---------+-----------+----------+--------------+     Summary: RIGHT: - There is no evidence of deep vein thrombosis in the lower extremity.  - No cystic structure found in the popliteal fossa.  LEFT: - There is no evidence of deep vein thrombosis in the lower extremity.  - No cystic structure found in the popliteal fossa.  *See table(s) above for measurements and observations. Electronically signed by Harold Barban MD on 02/11/2021 at 7:47:34 PM.    Final     Assessment/Plan  1. Essential hypertension B/p elevated on arrival. - continue on valsartan and metoprolol succinate  - CBC with Differential/Platelet  2. Pure hypercholesterolemia Continue dietary modification   3. Restless leg syndrome Continue pramipexole   4. Elevated LFTs LFT's elevated during recent hospitalization Will recheck LFT's    - CMP with  eGFR([facility]) - follow up with GI as directed   5. Gastroesophageal reflux disease without esophagitis Stable  Continue on Protonix and famotidine   6. Thrombocytopenia ([place]) PLT's trended up on discharge 102 ; 89; 128  - CBC with Differential/Platelet - follow up with Hematologist   7. Dyspnea on exertion No signs of fluid overload  - follow up with cardiologist as directed   Family/ staff Communication: Reviewed plan of care with patient verbalized understanding   Labs/tests ordered:  - CBC with Differential/Platelet - CMP with eGFR([facility])  Next Appointment: 2 months for medical management of chronic issues with Dani Gobble.Fasting Labs prior to visit.    Sandrea Hughs, NP

## 2021-02-23 ENCOUNTER — Other Ambulatory Visit: Payer: Self-pay | Admitting: *Deleted

## 2021-02-23 NOTE — Patient Outreach (Signed)
[facility]) Care Management  02/23/2021  Kenneth Rog Sr. Aug 24, 1950 [id number]   Referral Date: 12/13 Referral Source: Hospital liaison Referral Reason: Post hospital discharge follow up Insurance: Traditional Medicare     Outreach attempt #3, unsuccessful, unable to leave voice message.  Phone rings then stops after a few rings.  Attempted twice with same result.  Will make 4th and final attempt within the next 4 weeks, if remain unsuccessful will close case due to inability to maintain contact.  [name], RN, MSN, CCM [facility] Care Management  [facility] Manager [phone]

## 2021-02-27 ENCOUNTER — Telehealth: Payer: Self-pay | Admitting: Nurse Practitioner

## 2021-02-27 NOTE — Telephone Encounter (Signed)
Left patient a vm to call back and schedule next AWV

## 2021-03-12 ENCOUNTER — Ambulatory Visit: Payer: Medicare Other | Admitting: *Deleted

## 2021-03-15 ENCOUNTER — Other Ambulatory Visit: Payer: Self-pay

## 2021-03-15 ENCOUNTER — Telehealth: Payer: Self-pay | Admitting: Cardiology

## 2021-03-15 DIAGNOSIS — I25118 Atherosclerotic heart disease of native coronary artery with other forms of angina pectoris: Secondary | ICD-10-CM

## 2021-03-15 DIAGNOSIS — R079 Chest pain, unspecified: Secondary | ICD-10-CM

## 2021-03-15 NOTE — Telephone Encounter (Signed)
Can someone please place the order for the nuc stress test? This patient is having one on 03/19/21. Thank you.

## 2021-03-15 NOTE — Telephone Encounter (Signed)
Exercise or not?

## 2021-03-19 ENCOUNTER — Ambulatory Visit: Payer: Medicare Other

## 2021-03-19 ENCOUNTER — Other Ambulatory Visit: Payer: Self-pay

## 2021-03-19 DIAGNOSIS — I25118 Atherosclerotic heart disease of native coronary artery with other forms of angina pectoris: Secondary | ICD-10-CM | POA: Diagnosis not present

## 2021-03-19 DIAGNOSIS — R079 Chest pain, unspecified: Secondary | ICD-10-CM | POA: Diagnosis not present

## 2021-03-21 ENCOUNTER — Other Ambulatory Visit: Payer: Self-pay | Admitting: *Deleted

## 2021-03-21 NOTE — Patient Outreach (Signed)
[facility]) Care Management  03/21/2021  Kenneth Seidel Sr. 09/30/1950 [id number]   Referral Date: 12/13 Referral Source: Hospital liaison Referral Reason: Post hospital discharge follow up Insurance: Traditional Medicare     Outreach attempt #4, successful however member state this is not a good time to talk, will call back later.  If no call back, will follow up with 5th and final attempt within the next 3-4 business days.  [name], RN, MSN, CCM [facility] Care Management  [facility] Manager [phone]

## 2021-03-23 ENCOUNTER — Encounter: Payer: Self-pay | Admitting: Adult Health

## 2021-03-23 ENCOUNTER — Other Ambulatory Visit: Payer: Self-pay | Admitting: Student

## 2021-03-23 ENCOUNTER — Ambulatory Visit (INDEPENDENT_AMBULATORY_CARE_PROVIDER_SITE_OTHER): Payer: Medicare Other | Admitting: Adult Health

## 2021-03-23 ENCOUNTER — Other Ambulatory Visit: Payer: Self-pay | Admitting: Nurse Practitioner

## 2021-03-23 ENCOUNTER — Other Ambulatory Visit: Payer: Self-pay

## 2021-03-23 VITALS — BP 162/84 | HR 84 | Temp 97.5°F | Ht 68.0 in | Wt 201.2 lb

## 2021-03-23 DIAGNOSIS — I10 Essential (primary) hypertension: Secondary | ICD-10-CM | POA: Diagnosis not present

## 2021-03-23 DIAGNOSIS — I209 Angina pectoris, unspecified: Secondary | ICD-10-CM | POA: Diagnosis not present

## 2021-03-23 DIAGNOSIS — I25118 Atherosclerotic heart disease of native coronary artery with other forms of angina pectoris: Secondary | ICD-10-CM

## 2021-03-23 DIAGNOSIS — J410 Simple chronic bronchitis: Secondary | ICD-10-CM | POA: Diagnosis not present

## 2021-03-23 MED ORDER — GUAIFENESIN ER 600 MG PO TB12: 600.0000 mg | ORAL_TABLET | Freq: Two times a day (BID) | ORAL | 0 refills | Status: AC

## 2021-03-23 MED ORDER — SACCHAROMYCES BOULARDII 250 MG PO CAPS: 250.0000 mg | ORAL_CAPSULE | Freq: Two times a day (BID) | ORAL | 0 refills | Status: DC

## 2021-03-23 MED ORDER — DOXYCYCLINE HYCLATE 100 MG PO TABS: 100.0000 mg | ORAL_TABLET | Freq: Two times a day (BID) | ORAL | 0 refills | Status: AC

## 2021-03-23 NOTE — Progress Notes (Signed)
[facility] clinic  Provider:   Kenard Gower  DNP  Code Status: Full Code  Goals of Care:  Advanced Directives 03/23/2021  Does Patient Have a Medical Advance Directive? No  Does patient want to make changes to medical advance directive? -  Would patient like information on creating a medical advance directive? No - Patient declined  Pre-existing out of facility DNR order (yellow form or pink MOST form) -     Chief Complaint  Patient presents with   Acute Visit    Complains of chest tightness, sore throat, swollen glands and cough for 2 days now. Covid test this morning was negative.     HPI: Patient is a 71 y.o. male seen today for an acute visit for 2 days of coughing with yellowish to whitish phlegm, head congestion, runny nose , sore throat and chest tightness. He denies having fever. He stated that this happens to him every year when the weather changes. He denies having nausea nor diarrhea. He has a good appetite. There is no known sick exposure. He is unvaccinated with COVID-19, flu, pneumonia nor shingles.  Past Medical History:  Diagnosis Date   Arthritis    "left thumb" (05/23/2017) right shoulder   Barrett's esophagus    "we've been told that it's all gone; still take RX for GERD" (05/23/2017)   Bilateral swelling of feet and ankles x 3 weeks as of 12-01-2019   CAD (coronary artery disease), native coronary artery    Coronary angiogram 05/03/2014:  Proximal LAD 3.0x12 mm Promus premier stent.  04/08/2014: Mid Cx 3.5 x 16 mm Promus DES.  03/29/2013: Mid LAD 2.75 x 38 mm Promus Premier drug-eluting stent, balloon angioplasty of D1 and distal LAD and stenting of distal RCA with 2.75 x 24 mm promos Premier drug-eluting stent 12/15/2012 widely patent.   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  04/08/2014: Mid Cx 3.5 x 16 mm Promus DES.  03/29/2013: Mid LAD 2.75 x 38 mm Promus Premier drug-eluting stent, balloon angioplasty of D1 and distal LAD and stenting of distal RCA with 2.75 x 24 mm promos Premier drug-eluting stent 12/15/2012 widely patent.    Past Surgical History:  Procedure Laterality Date  BICEPT TENODESIS Right 12/07/2019   Procedure: RIGHT BICEPS TENODESIS;  Surgeon: Sheral ApleyMurphy, Timothy D, MD;  [facility];  Service: Orthopedics;  Laterality: Right;   CARDIAC CATHETERIZATION N/A 09/25/2015   Procedure: Left Heart Cath and Coronary Angiography;  Surgeon: Yates DecampJay Ganji, MD;  [facility];  Service: Cardiovascular;  Laterality: N/A;   CARDIAC CATHETERIZATION N/A 09/25/2015   Procedure: Intravascular Pressure Wire/FFR Study;  Surgeon: Yates DecampJay Ganji, MD;  [facility];  Service: Cardiovascular;  Laterality: N/A;   CORONARY ANGIOPLASTY WITH STENT PLACEMENT  12/15/2012; 04/28/2014; 05/03/2014   "2; 1; 1" , 4 stents 2 balloons   FRACTIONAL FLOW RESERVE WIRE Right 03/26/2013   Procedure: FRACTIONAL  FLOW RESERVE WIRE;  Surgeon: Pamella PertJagadeesh R Ganji, MD;  [facility];  Service: Cardiovascular;  Laterality: Right;   FRACTIONAL FLOW RESERVE WIRE N/A 05/03/2014   Procedure: FRACTIONAL FLOW RESERVE WIRE;  Surgeon: Pamella PertJagadeesh R Ganji, MD;  [facility];  Service: Cardiovascular;  Laterality: N/A;   HERNIA REPAIR  2010   "umbilical"    INCISION AND DRAINAGE ABSCESS Left 05/2007   "groin"    KNEE SURGERY Right 1992;  2000   "calcium deposits removed" (12/15/2012)   LEFT HEART CATH AND CORONARY ANGIOGRAPHY N/A 05/23/2017   Procedure: LEFT HEART CATH AND CORONARY ANGIOGRAPHY;  Surgeon: Yates DecampGanji, Jay, MD;  Location: [facility] INVAS[facility] CV LAB;  Service: Cardiovascular;  Laterality: N/A;   LEFT HEART CATHETERIZATION WITH CORONARY ANGIOGRAM N/A 12/15/2012   Procedure: LEFT HEART CATHETERIZATION WITH CORONARY ANGIOGRAM;  Surgeon: Pamella PertJagadeesh R Ganji, MD;  [facility];  Service: Cardiovascular;  Laterality: N/A;   LEFT HEART CATHETERIZATION WITH CORONARY ANGIOGRAM N/A 03/26/2013   Procedure: LEFT HEART CATHETERIZATION WITH CORONARY ANGIOGRAM;  Surgeon: Pamella PertJagadeesh R Ganji, MD;  [facility];  Service: Cardiovascular;  Laterality: N/A;   LEFT HEART CATHETERIZATION WITH CORONARY ANGIOGRAM N/A 04/08/2014   Procedure: LEFT HEART CATHETERIZATION WITH CORONARY ANGIOGRAM;  Surgeon: Micheline ChapmanMichael D Cooper, MD;  [facility];  Service: Cardiovascular;  Laterality: N/A;   PERCUTANEOUS CORONARY STENT INTERVENTION (PCI-S) N/A 05/03/2014   Procedure: PERCUTANEOUS CORONARY STENT INTERVENTION (PCI-S);  Surgeon: Pamella PertJagadeesh R Ganji, MD;  [facility];  Service: Cardiovascular;  Laterality: N/A;   RADIOLOGY WITH ANESTHESIA N/A 02/08/2021   Procedure: MRI WITH ANESTHESIA OF ABDOMEN WITH AND WITHOUT CONSTRAST;  Surgeon: Radiologist, Medication, MD;  Location: [facility] OR;  Service: Radiology;  Laterality: N/A;   ROTATOR CUFF REPAIR     UMBILICAL HERNIA REPAIR  yrs ago    Allergies  Allergen Reactions    Lyrica [Pregabalin] Other (See Comments)    Made patient very lethargic the next morning, very hard to patient to function, move, etc.    Statins Other (See Comments)    Causes Restless Legs, rhabdomyolysis   Ativan [Lorazepam] Other (See Comments)    Markedly increased restless legs   Trulicity [Dulaglutide] Other (See Comments)    GI side effects     Outpatient Encounter Medications as of 03/23/2021  Medication Sig   acetaminophen (TYLENOL) 500 MG tablet Take 500 mg by mouth every 6 (six) hours as needed for mild pain, moderate pain, headache or fever.   albuterol (PROVENTIL HFA;VENTOLIN HFA) 108 (90 Base) MCG/ACT inhaler Inhale 2 puffs into the lungs every 6 (six) hours as needed for wheezing or shortness of breath.   Alirocumab (PRALUENT) 150 MG/ML SOAJ Inject 1 pen into the skin every 14 (fourteen) days.  BD PEN NEEDLE NANO 2ND GEN 32G X 4 MM MISC USE AS DIRECTED   Cholecalciferol (D-5000) 125 MCG (5000 UT) TABS Take 1 tablet (5,000 Units total) by mouth daily. E55.9   Continuous Blood Gluc Receiver (FREESTYLE LIBRE 14 DAY READER) DEVI Inject 1 Device as directed daily as needed. Use once daily as direct to check blood sugar E11.51   Continuous Blood Gluc Sensor (FREESTYLE LIBRE 14 DAY SENSOR) MISC Use to test blood sugar three times daily. E11.51. E11.59   famotidine (PEPCID) 20 MG tablet TAKE 1 TABLET BY MOUTH EVERY DAY   fluticasone (FLONASE) 50 MCG/ACT nasal spray Place 1 spray into both nostrils daily as needed for allergies or rhinitis.   gabapentin (NEURONTIN) 100 MG capsule TAKE 2 CAPSULES BY MOUTH AT BEDTIME.   glucose 4 GM chewable tablet Chew 1 tablet (4 g total) by mouth as needed for low blood sugar.   insulin aspart (NOVOLOG FLEXPEN) 100 UNIT/ML FlexPen Inject 15-20 Units into the skin 3 (three) times daily with meals.   Insulin Glargine (BASAGLAR KWIKPEN) 100 UNIT/ML Inject 40 units subcutaneously into the skin at bedtime   LIFESCAN FINEPOINT LANCETS MISC Use to test for  blood sugar three times daily dx: E11.51   metoprolol succinate (TOPROL-XL) 25 MG 24 hr tablet TAKE 1 TABLET BY MOUTH DAILY. TAKE WITH OR IMMEDIATELY FOLLOWING A MEAL.   nitroGLYCERIN (NITROSTAT) 0.4 MG SL tablet Place 1 tablet (0.4 mg total) under the tongue every 5 (five) minutes as needed for chest pain.   ONETOUCH ULTRA test strip USE TO TEST FOR BLOOD SUGAR THREE TIMES DAILY DX: E11.51   pantoprazole (PROTONIX) 40 MG tablet Take one tablet by mouth once daily for stomach   pramipexole (MIRAPEX) 1 MG tablet TAKE 1 TABLET BY MOUTH AT 5PM, 1 TABLET BY MOUTH AT 7PM, AND TAKE 2 TABLETS BY MOUTH AT BEDTIME.   valsartan (DIOVAN) 160 MG tablet Take 1 tablet (160 mg total) by mouth every evening.   dapagliflozin propanediol (FARXIGA) 10 MG TABS tablet Take 1 tablet (10 mg total) by mouth daily before breakfast.   [DISCONTINUED] metoprolol succinate (TOPROL-XL) 25 MG 24 hr tablet Take 0.5 tablets (12.5 mg total) by mouth daily. Take with or immediately following a meal.   No facility-administered encounter medications on file as of 03/23/2021.    Review of Systems:  Review of Systems  Constitutional:  Negative for activity change, appetite change and fever.  HENT:  Positive for congestion and sore throat.   Eyes: Negative.  Negative for discharge.  Respiratory:  Positive for cough and chest tightness. Negative for shortness of breath and wheezing.   Cardiovascular:  Negative for chest pain.  Gastrointestinal:  Negative for abdominal distention, diarrhea and vomiting.  Genitourinary:  Negative for dysuria, frequency and urgency.  Skin:  Negative for color change.  Neurological:  Negative for dizziness and headaches.  Psychiatric/Behavioral:  Negative for behavioral problems and sleep disturbance. The patient is not nervous/anxious.    Health Maintenance  Topic Date Due   COVID-19 Vaccine (1) Never done   Zoster Vaccines- Shingrix (1 of 2) Never done   Pneumonia Vaccine 59+ Years old (2 -  PPSV23 if available, else PCV20) 12/02/2014   INFLUENZA VACCINE  10/02/2020   HEMOGLOBIN A1C  07/15/2021   OPHTHALMOLOGY EXAM  09/05/2021   FOOT EXAM  12/25/2021   COLONOSCOPY (Pts 45-67yrs Insurance coverage will need to be confirmed)  10/18/2025   TETANUS/TDAP  03/02/2027   Hepatitis C Screening  Completed  HPV VACCINES  Aged Out    Physical Exam: Vitals:   03/23/21 1438  BP: (!) 162/84  Pulse: 84  Temp: (!) 97.5 F (36.4 C)  SpO2: 96%  Weight: 201 lb 3.2 oz (91.3 kg)  Height: 5\' 8"  (1.727 m)   Body mass index is 30.59 kg/m. Physical Exam Constitutional:      Appearance: He is obese.  HENT:     Head: Normocephalic and atraumatic.     Nose: Nose normal. No congestion or rhinorrhea.     Mouth/Throat:     Mouth: Mucous membranes are moist.     Pharynx: Oropharynx is clear.  Eyes:     Conjunctiva/sclera: Conjunctivae normal.  Cardiovascular:     Rate and Rhythm: Normal rate and regular rhythm.  Pulmonary:     Effort: Pulmonary effort is normal.     Breath sounds: Normal breath sounds.  Abdominal:     General: Bowel sounds are normal.     Palpations: Abdomen is soft.  Musculoskeletal:        General: No swelling. Normal range of motion.     Cervical back: Normal range of motion.  Skin:    General: Skin is warm and dry.  Neurological:     General: No focal deficit present.     Mental Status: He is alert and oriented to person, place, and time.  Psychiatric:        Mood and Affect: Mood normal.        Behavior: Behavior normal.        Thought Content: Thought content normal.        Judgment: Judgment normal.    Labs reviewed: Basic Metabolic Panel: Recent Labs    01/15/21 0811 01/31/21 0919 02/10/21 1026 02/11/21 1137 02/21/21 0912  NA  --    < > 132* 133* 136  K  --    < > 4.2 4.5 4.9  CL  --    < > 100 100 102  CO2  --    < > 24 24 27   GLUCOSE  --    < > 215* 260* 263*  BUN  --    < > 19 17 20   CREATININE  --    < > 1.29* 1.02 1.19  CALCIUM  --     < > 9.0 9.1 9.3  TSH 2.684  --   --   --   --    < > = values in this interval not displayed.   Liver Function Tests: Recent Labs    02/09/21 0240 02/10/21 1026 02/11/21 1137 02/21/21 0912  AST 74* 52* 53* 22  ALT 194* 151* 129* 37  ALKPHOS 84 87 101  --   BILITOT 1.9* 1.8* 1.4* 0.6  PROT 5.6* 6.4* 6.7 6.3  ALBUMIN 2.9* 3.2* 3.5  --    Recent Labs    02/06/21 1430 02/07/21 1430  LIPASE 24 26  AMYLASE  --  36   No results for input(s): AMMONIA in the last 8760 hours. CBC: Recent Labs    01/14/21 1146 01/14/21 1152 02/07/21 0826 02/08/21 0225 02/10/21 1026 02/10/21 1157 02/21/21 0912  WBC 5.6   < > 9.2 5.5 5.3  --  5.8  NEUTROABS 3.9  --  8.4*  --   --   --  4,014  HGB 14.2   < > 14.3 12.5* 14.0  --  13.3  HCT 42.5   < > 42.3 37.8* 41.5  --  39.3  MCV 88.0   < >  88.9 90.4 88.3  --  87.9  PLT 131*   < > 102* 89* 128* 121* 182   < > = values in this interval not displayed.   Lipid Panel: Recent Labs    06/30/20 0808 01/15/21 0811  CHOL 104 81  HDL 42 31*  LDLCALC 37 15  TRIG 173* 174*  CHOLHDL 2.5 2.6   Lab Results  Component Value Date   HGBA1C 7.9 (H) 01/15/2021    Procedures since last visit: No results found.  Assessment/Plan  1. Simple chronic bronchitis (HCC) -  instructed to gargle with warm water and salt - doxycycline (VIBRA-TABS) 100 MG tablet; Take 1 tablet (100 mg total) by mouth 2 (two) times daily for 10 days.  Dispense: 20 tablet; Refill: 0 - saccharomyces boulardii (FLORASTOR) 250 MG capsule; Take 1 capsule (250 mg total) by mouth 2 (two) times daily for 13 days.  Dispense: 26 capsule; Refill: 0 - guaiFENesin (MUCINEX) 600 MG 12 hr tablet; Take 1 tablet (600 mg total) by mouth 2 (two) times daily for 14 days.  Dispense: 28 tablet; Refill: 0  2. Essential hypertension -  Blood pressure well controlled Continue current medications    Labs/tests ordered:  None  Next appt:  05/01/2021

## 2021-03-23 NOTE — Patient Instructions (Signed)
Acute Bronchitis, Adult ?Acute bronchitis is when air tubes in the lungs (bronchi) suddenly get swollen. The condition can make it hard for you to breathe. In adults, acute bronchitis usually goes away within 2 weeks. A cough caused by bronchitis may last up to 3 weeks. Smoking, allergies, and asthma can make the condition worse. ?What are the causes? ?Germs that cause cold and flu (viruses). The most common cause of this condition is the virus that causes the common cold. ?Bacteria. ?Substances that bother (irritate) the lungs, including: ?Smoke from cigarettes and other types of tobacco. ?Dust and pollen. ?Fumes from chemicals, gases, or burned fuel. ?Indoor or outdoor air pollution. ?What increases the risk? ?A weak body's defense system. This is also called the immune system. ?Any condition that affects your lungs and breathing, such as asthma. ?What are the signs or symptoms? ?A cough. ?Coughing up clear, yellow, or green mucus. ?Making high-pitched whistling sounds when you breathe, most often when you breathe out (wheezing). ?Runny or stuffy nose. ?Having too much mucus in your lungs (chest congestion). ?Shortness of breath. ?Body aches. ?A sore throat. ?How is this treated? ?Acute bronchitis may go away over time without treatment. Your doctor may tell you to: ?Drink more fluids. This will help thin your mucus so it is easier to cough up. ?Use a device that gets medicine into your lungs (inhaler). ?Use a vaporizer or a humidifier. These are machines that add water to the air. This helps with coughing and poor breathing. ?Take a medicine that thins mucus and helps clear it from your lungs. ?Take a medicine that prevents or stops coughing. ?It is not common to take an antibiotic medicine for this condition. ?Follow these instructions at home: ? ?Take over-the-counter and prescription medicines only as told by your doctor. ?Use an inhaler, vaporizer, or humidifier as told by your doctor. ?Take two teaspoons (10  mL) of honey at bedtime. This helps lessen your coughing at night. ?Drink enough fluid to keep your pee (urine) pale yellow. ?Do not smoke or use any products that contain nicotine or tobacco. If you need help quitting, ask your doctor. ?Get a lot of rest. ?Return to your normal activities when your doctor says that it is safe. ?Keep all follow-up visits. ?How is this prevented? ? ?Wash your hands often with soap and water for at least 20 seconds. If you cannot use soap and water, use hand sanitizer. ?Avoid contact with people who have cold symptoms. ?Try not to touch your mouth, nose, or eyes with your hands. ?Avoid breathing in smoke or chemical fumes. ?Make sure to get the flu shot every year. ?Contact a doctor if: ?Your symptoms do not get better in 2 weeks. ?You have trouble coughing up the mucus. ?Your cough keeps you awake at night. ?You have a fever. ?Get help right away if: ?You cough up blood. ?You have chest pain. ?You have very bad shortness of breath. ?You faint or keep feeling like you are going to faint. ?You have a very bad headache. ?Your fever or chills get worse. ?These symptoms may be an emergency. Get help right away. Call your local emergency services (911 in the [place] ?Do not wait to see if the symptoms will go away. ?Do not drive yourself to the hospital. ?Summary ?Acute bronchitis is when air tubes in the lungs (bronchi) suddenly get swollen. In adults, acute bronchitis usually goes away within 2 weeks. ?Drink more fluids. This will help thin your mucus so it is easier to   cough up. ?Take over-the-counter and prescription medicines only as told by your doctor. ?Contact a doctor if your symptoms do not improve after 2 weeks of treatment. ?This information is not intended to replace advice given to you by your health care provider. Make sure you discuss any questions you have with your health care provider. ?Document Revised: 06/21/2020 Document Reviewed: 06/21/2020 ?Elsevier Patient Education  ? 2022 [employer] ? ?

## 2021-03-25 ENCOUNTER — Other Ambulatory Visit: Payer: Self-pay | Admitting: Nurse Practitioner

## 2021-03-26 ENCOUNTER — Other Ambulatory Visit: Payer: Self-pay | Admitting: *Deleted

## 2021-03-26 DIAGNOSIS — J410 Simple chronic bronchitis: Secondary | ICD-10-CM

## 2021-03-26 MED ORDER — SACCHAROMYCES BOULARDII 250 MG PO CAPS: 250.0000 mg | ORAL_CAPSULE | Freq: Two times a day (BID) | ORAL | 0 refills | Status: AC

## 2021-03-26 NOTE — Telephone Encounter (Signed)
Patient called and stated that the pharmacy never received his Rx for the Florastor that was called in Friday. Refaxed.

## 2021-03-26 NOTE — Patient Outreach (Signed)
[facility]) Care Management  03/26/2021  Kenneth Tauzin Sr. 1950-12-31 [id number]    Referral Date: 12/13 Referral Source: Hospital liaison Referral Reason: Post hospital discharge follow up Insurance: Traditional Medicare   Outreach attempt #5, successful but he state he does not feel up to talking, has bronchitis.  Inquired if there was anything urgent this RNCM could help with, he denies.  Agrees to follow up within the next 3-4 business days.  [name], RN, MSN, CCM [facility] Care Management  [facility] Manager [phone]

## 2021-03-27 ENCOUNTER — Ambulatory Visit: Payer: Medicare Other | Admitting: Endocrinology

## 2021-03-27 NOTE — Progress Notes (Signed)
I have reviewed the results of the stress test with the patient, he has abnormal EKG response, may suggest small vessel disease or significant CAD but overall given normal LV systolic function and normal perfusion, low to at most intermediate risk nuclear study.  Patient is presently doing well, advised him that to resume full activity once his cold and cough is resolved and if he continues to have persistent chest discomfort that is lifestyle limiting, we could consider cardiac catheterization.  Patient is aware of this, he prefers to wait on cardiac catheterization as well.

## 2021-03-29 ENCOUNTER — Other Ambulatory Visit: Payer: Self-pay | Admitting: *Deleted

## 2021-03-29 NOTE — Patient Outreach (Signed)
[facility]) Care Management  03/29/2021  Conlan Clanin Sr. 12-11-1950 [id number]   Referral Date: 12/13 Referral Source: Hospital liaison Referral Reason: Post hospital discharge follow up Insurance: Traditional Medicare   Outreach attempt #6, successful.  Identity verified.  This care manager introduced self and stated purpose of call.  [facility] care management services explained.    Member state he is doing well does not feel he need additional assistance with management of health conditions.  Benefits of [facility] explained, he again declines.  Offered to send [facility] brochure to review, state he already has one.  Encouraged to consider and contact this care manager should he decide to participate.    Plan: RN CM will close case at this time, member declines participation.  Will notify PCP of case closure.  Valente David, RN, MSN, [place] Manager [phone]

## 2021-04-11 ENCOUNTER — Encounter: Payer: Self-pay | Admitting: Student

## 2021-04-11 ENCOUNTER — Ambulatory Visit: Payer: Medicare Other | Admitting: Student

## 2021-04-11 ENCOUNTER — Other Ambulatory Visit: Payer: Self-pay

## 2021-04-11 VITALS — BP 152/88 | HR 66 | Temp 97.9°F | Resp 16 | Ht 68.0 in | Wt 203.6 lb

## 2021-04-11 DIAGNOSIS — I10 Essential (primary) hypertension: Secondary | ICD-10-CM | POA: Diagnosis not present

## 2021-04-11 DIAGNOSIS — I208 Other forms of angina pectoris: Secondary | ICD-10-CM

## 2021-04-11 DIAGNOSIS — I25118 Atherosclerotic heart disease of native coronary artery with other forms of angina pectoris: Secondary | ICD-10-CM

## 2021-04-11 MED ORDER — ISOSORBIDE MONONITRATE ER 60 MG PO TB24: 60.0000 mg | ORAL_TABLET | Freq: Every day | ORAL | 3 refills | Status: DC

## 2021-04-11 MED ORDER — CLOPIDOGREL BISULFATE 75 MG PO TABS: 75.0000 mg | ORAL_TABLET | Freq: Every day | ORAL | 3 refills | Status: DC

## 2021-04-11 MED ORDER — ASPIRIN EC 81 MG PO TBEC: 81.0000 mg | DELAYED_RELEASE_TABLET | Freq: Every day | ORAL | 3 refills | Status: AC

## 2021-04-11 NOTE — Progress Notes (Signed)
Primary Physician/Referring:  Lauree Chandler, NP  Patient ID: Kenneth Lav Sr., male    DOB: Feb 20, 1951, 71 y.o.   MRN: [id number]  No chief complaint on file.  HPI:    Kenneth Suchanek Sr.  is a 71 y.o. caucasian male with coronary artery disease, last cardiac catheterization on 05/29/2017 when he presented with unstable anginal-like symptoms revealing widely patent stent to his LAD, RCA and circumflex and also balloon angioplasty site to D1 and distal LAD. Fortunately his last PCI has been in 2016.  Past medical history significant for hypertension, hyperlipidemia, GERD, Barrett's esophagus, statin intolerance, uncontrolled diabetes mellitus and also severe restless leg syndrome.  Patient was last seen in our office by Dr. Einar Gip 02/05/2021 at which time a nuclear stress test was ordered, which revealed abnormal EKG response suggestive of small vessel disease or significant CAD but overall given normal LV systolic function and normal perfusion, low to at most intermediate risk nuclear study.  Dr. Einar Gip discussed results with patient and shared decision was to hold off on cardiac catheterization.  Patient now presents for urgent visit at his request with complaints of chest pain similar to previous symptoms with exertion for which he has been taking sublingual nitroglycerin with relief.  Past Medical History:  Diagnosis Date   Arthritis    "left thumb" (05/23/2017) right shoulder   Barrett's esophagus    "we've been told that it's all gone; still take RX for GERD" (05/23/2017)   Bilateral swelling of feet and ankles x 3 weeks as of 12-01-2019   CAD (coronary artery disease), native coronary artery    Coronary angiogram 05/03/2014:  Proximal LAD 3.0x12 mm Promus premier stent.  04/08/2014: Mid Cx 3.5 x 16 mm Promus DES.  03/29/2013: Mid LAD 2.75 x 38 mm Promus Premier drug-eluting stent, balloon angioplasty of D1 and distal LAD and stenting of distal RCA with 2.75 x 24 mm promos Premier drug-eluting stent  12/15/2012 widely patent.   Cervicalgia    Chronic bronchitis ([place])    "q yr" ([id number])   Complication of anesthesia    " I do not wake up very well "  slow to wake up   COVID-19 05/2019   all covid symptoms in hospital x 5 days, all symptoms resolved took monoclonal antibody tx    Dyspnea    with exertion   Family history of adverse reaction to anesthesia    "son w/PONV"   GERD (gastroesophageal reflux disease)    Heart murmur    "grew out of it"    Hyperlipidemia    Hypertension    Hypoglycemia, unspecified    IDDM (insulin dependent diabetes mellitus)    type 2   Impotence of organic origin    Iron deficiency anemia    Memory loss    mild   Other malaise and fatigue    Pneumonia 05/2019   PONV (postoperative nausea and vomiting)    after wisdom teeth pulled, no problems with other surgeries   Restless leg    Rotator cuff arthropathy    Stroke ([place])    Tension headache    "sometimes" (05/23/2017)   Unspecified gastritis and gastroduodenitis with hemorrhage    Unstable angina pectoris ([place]) 04/08/2014   Coronary angiogram 05/03/2014:  Proximal LAD 3.0x12 mm Promus premier stent.  04/08/2014: Mid Cx 3.5 x 16 mm Promus DES.  03/29/2013: Mid LAD 2.75 x 38 mm Promus Premier drug-eluting stent, balloon angioplasty of D1 and distal LAD and stenting of distal RCA with  2.75 x 24 mm promos Premier drug-eluting stent 12/15/2012 widely patent.   Past Surgical History:  Procedure Laterality Date   BICEPT TENODESIS Right 12/07/2019   Procedure: RIGHT BICEPS TENODESIS;  Surgeon: Renette Butters, MD;  Location: [facility];  Service: Orthopedics;  Laterality: Right;   CARDIAC CATHETERIZATION N/A 09/25/2015   Procedure: Left Heart Cath and Coronary Angiography;  Surgeon: Adrian Prows, MD;  Location: [place] [facility];  Service: Cardiovascular;  Laterality: N/A;   CARDIAC CATHETERIZATION N/A 09/25/2015   Procedure: Intravascular Pressure Wire/FFR Study;  Surgeon: Adrian Prows, MD;   Location: [place] [facility];  Service: Cardiovascular;  Laterality: N/A;   CORONARY ANGIOPLASTY WITH STENT PLACEMENT  12/15/2012; 04/28/2014; 05/03/2014   "2; 1; 1" , 4 stents 2 balloons   FRACTIONAL FLOW RESERVE WIRE Right 03/26/2013   Procedure: FRACTIONAL FLOW RESERVE WIRE;  Surgeon: Laverda Page, MD;  Location: [facility] CATH LAB;  Service: Cardiovascular;  Laterality: Right;   FRACTIONAL FLOW RESERVE WIRE N/A 05/03/2014   Procedure: FRACTIONAL FLOW RESERVE WIRE;  Surgeon: Laverda Page, MD;  Location: [facility] CATH LAB;  Service: Cardiovascular;  Laterality: N/A;   HERNIA REPAIR  [id number]   "umbilical"    INCISION AND DRAINAGE ABSCESS Left 05/2007   "groin"    KNEE SURGERY Right 1992;  2000   "calcium deposits removed" (12/15/2012)   LEFT HEART CATH AND CORONARY ANGIOGRAPHY N/A 05/23/2017   Procedure: LEFT HEART CATH AND CORONARY ANGIOGRAPHY;  Surgeon: Adrian Prows, MD;  Location: [place] [facility];  Service: Cardiovascular;  Laterality: N/A;   LEFT HEART CATHETERIZATION WITH CORONARY ANGIOGRAM N/A 12/15/2012   Procedure: LEFT HEART CATHETERIZATION WITH CORONARY ANGIOGRAM;  Surgeon: Laverda Page, MD;  Location: [facility] CATH LAB;  Service: Cardiovascular;  Laterality: N/A;   LEFT HEART CATHETERIZATION WITH CORONARY ANGIOGRAM N/A 03/26/2013   Procedure: LEFT HEART CATHETERIZATION WITH CORONARY ANGIOGRAM;  Surgeon: Laverda Page, MD;  Location: [facility] CATH LAB;  Service: Cardiovascular;  Laterality: N/A;   LEFT HEART CATHETERIZATION WITH CORONARY ANGIOGRAM N/A 04/08/2014   Procedure: LEFT HEART CATHETERIZATION WITH CORONARY ANGIOGRAM;  Surgeon: Blane Ohara, MD;  Location: [facility] CATH LAB;  Service: Cardiovascular;  Laterality: N/A;   PERCUTANEOUS CORONARY STENT INTERVENTION (PCI-S) N/A 05/03/2014   Procedure: PERCUTANEOUS CORONARY STENT INTERVENTION (PCI-S);  Surgeon: Laverda Page, MD;  Location: [facility] CATH LAB;  Service: Cardiovascular;  Laterality: N/A;   RADIOLOGY WITH ANESTHESIA N/A 02/08/2021    Procedure: MRI WITH ANESTHESIA OF ABDOMEN WITH AND WITHOUT CONSTRAST;  Surgeon: Radiologist, Medication, MD;  Location: [place];  Service: Radiology;  Laterality: N/A;   ROTATOR CUFF REPAIR     UMBILICAL HERNIA REPAIR  yrs ago   Family History  Adopted: Yes  Problem Relation Age of Onset   Emphysema Mother    Social History   Tobacco Use   Smoking status: Never   Smokeless tobacco: Never  Substance Use Topics   Alcohol use: No     ROS  Review of Systems  Cardiovascular:  Positive for chest pain (stable), claudication (stable) and dyspnea on exertion. Negative for leg swelling, near-syncope, orthopnea, palpitations, paroxysmal nocturnal dyspnea and syncope.  Respiratory:  Negative for shortness of breath.   Musculoskeletal:  Positive for muscle cramps (restless leg).  Neurological:  Negative for dizziness.   Objective   Vitals with BMI 04/11/2021 03/23/2021 02/21/2021  Height 5\' 8"  5\' 8"  -  Weight 203 lbs 10 oz 201 lbs 3 oz -  BMI 30.96 30.6 -  Systolic 0000000 0000000 [id number]  Diastolic 88 84 70  Pulse 66 84 -    Blood pressure (!) 152/88, pulse 66, temperature 97.9 F (36.6 C), temperature source Temporal, resp. rate 16, height 5\' 8"  (1.727 m), weight 203 lb 9.6 oz (92.4 kg), SpO2 95 %. Body mass index is 30.96 kg/m.   Physical Exam Vitals reviewed.  Constitutional:      Appearance: He is well-developed.  Neck:     Vascular: No carotid bruit.  Cardiovascular:     Rate and Rhythm: Normal rate and regular rhythm.     Pulses:          Carotid pulses are 2+ on the right side and 2+ on the left side.      Femoral pulses are 2+ on the right side and 2+ on the left side.      Dorsalis pedis pulses are 0 on the right side and 0 on the left side.       Posterior tibial pulses are 1+ on the right side and 0 on the left side.     Heart sounds: Murmur heard.  Blowing decrescendo early diastolic murmur is present at the upper right sternal border radiating to the apex.    No gallop.      Comments: No JVD. Pulmonary:     Effort: Pulmonary effort is normal.     Breath sounds: Normal breath sounds.  Musculoskeletal:     Right lower leg: Edema (trace) present.     Left lower leg: Edema (trace) present.   Laboratory examination:   Recent Labs    06/30/20 0808 01/03/21 0825 02/09/21 0240 02/10/21 1026 02/11/21 1137 02/21/21 0912  NA 137   < > 132* 132* 133* 136  K 5.2   < > 5.1 4.2 4.5 4.9  CL 102   < > 98 100 100 102  CO2 30   < > 26 24 24 27   GLUCOSE 152*   < > 327* 215* 260* 263*  BUN 25   < > 25* 19 17 20   CREATININE 1.25*   < > 1.52* 1.29* 1.02 1.19  CALCIUM 9.7   < > 8.4* 9.0 9.1 9.3  GFRNONAA 58*   < > 49* 59* >60  --   GFRAA 67  --   --   --   --   --    < > = values in this interval not displayed.   CMP Latest Ref Rng & Units 02/21/2021 02/11/2021 02/10/2021  Glucose 65 - 99 mg/dL 263(H) 260(H) 215(H)  BUN 7 - 25 mg/dL 20 17 19   Creatinine 0.70 - 1.28 mg/dL 1.19 1.02 1.29(H)  Sodium 135 - 146 mmol/L 136 133(L) 132(L)  Potassium 3.5 - 5.3 mmol/L 4.9 4.5 4.2  Chloride 98 - 110 mmol/L 102 100 100  CO2 20 - 32 mmol/L 27 24 24   Calcium 8.6 - 10.3 mg/dL 9.3 9.1 9.0  Total Protein 6.1 - 8.1 g/dL 6.3 6.7 6.4(L)  Total Bilirubin 0.2 - 1.2 mg/dL 0.6 1.4(H) 1.8(H)  Alkaline Phos 38 - 126 U/L - 101 87  AST 10 - 35 U/L 22 53(H) 52(H)  ALT 9 - 46 U/L 37 129(H) 151(H)   CBC Latest Ref Rng & Units 02/21/2021 02/10/2021 02/10/2021  WBC 3.8 - 10.8 Thousand/uL 5.8 - 5.3  Hemoglobin 13.2 - 17.1 g/dL 13.3 - 14.0  Hematocrit 38.5 - 50.0 % 39.3 - 41.5  Platelets 140 - 400 Thousand/uL 182 121(L) 128(L)   Lipid Panel Recent  Labs    06/30/20 0808 01/15/21 0811  CHOL 104 81  TRIG 173* 174*  LDLCALC 37 15  VLDL  --  35  HDL 42 31*  CHOLHDL 2.5 2.6    HEMOGLOBIN A1C Lab Results  Component Value Date   HGBA1C 7.9 (H) 01/15/2021   MPG 180.03 01/15/2021   TSH Recent Labs    01/15/21 0811  TSH 2.684     Allergies   Allergies  Allergen Reactions    Lyrica [Pregabalin] Other (See Comments)    Made patient very lethargic the next morning, very hard to patient to function, move, etc.    Statins Other (See Comments)    Causes Restless Legs, rhabdomyolysis   Ativan [Lorazepam] Other (See Comments)    Markedly increased restless legs   Trulicity [Dulaglutide] Other (See Comments)    GI side effects     Medications Prior to Visit:   Outpatient Medications Prior to Visit  Medication Sig Dispense Refill   acetaminophen (TYLENOL) 500 MG tablet Take 500 mg by mouth every 6 (six) hours as needed for mild pain, moderate pain, headache or fever.     albuterol (PROVENTIL HFA;VENTOLIN HFA) 108 (90 Base) MCG/ACT inhaler Inhale 2 puffs into the lungs every 6 (six) hours as needed for wheezing or shortness of breath. 18 g 1   Alirocumab (PRALUENT) 150 MG/ML SOAJ Inject 1 pen into the skin every 14 (fourteen) days. 6 mL 3   BD PEN NEEDLE NANO 2ND GEN 32G X 4 MM MISC USE AS DIRECTED 100 each 3   Cholecalciferol (D-5000) 125 MCG (5000 UT) TABS Take 1 tablet (5,000 Units total) by mouth daily. E55.9 90 tablet 3   Continuous Blood Gluc Receiver (FREESTYLE LIBRE 14 DAY READER) DEVI Inject 1 Device as directed daily as needed. Use once daily as direct to check blood sugar E11.51 1 each 0   Continuous Blood Gluc Sensor (FREESTYLE LIBRE 14 DAY SENSOR) MISC Use to test blood sugar three times daily. E11.51. E11.59 2 each 12   dapagliflozin propanediol (FARXIGA) 10 MG TABS tablet Take 1 tablet (10 mg total) by mouth daily before breakfast. 90 tablet 3   fluticasone (FLONASE) 50 MCG/ACT nasal spray Place 1 spray into both nostrils daily as needed for allergies or rhinitis.     gabapentin (NEURONTIN) 100 MG capsule TAKE 2 CAPSULES BY MOUTH AT BEDTIME. 180 capsule 1   glucose 4 GM chewable tablet Chew 1 tablet (4 g total) by mouth as needed for low blood sugar. 50 tablet 12   insulin aspart (NOVOLOG FLEXPEN) 100 UNIT/ML FlexPen Inject 15-20 Units into the skin 3  (three) times daily with meals. 60 mL 3   Insulin Glargine (BASAGLAR KWIKPEN) 100 UNIT/ML Inject 40 units subcutaneously into the skin at bedtime 45 mL 4   LIFESCAN FINEPOINT LANCETS MISC Use to test for blood sugar three times daily dx: E11.51 300 each 1   metoprolol succinate (TOPROL-XL) 25 MG 24 hr tablet TAKE 1 TABLET BY MOUTH DAILY. TAKE WITH OR IMMEDIATELY FOLLOWING A MEAL. 90 tablet 1   nitroGLYCERIN (NITROSTAT) 0.4 MG SL tablet Place 1 tablet (0.4 mg total) under the tongue every 5 (five) minutes as needed for chest pain. 50 tablet 0   ONETOUCH ULTRA test strip USE TO TEST FOR BLOOD SUGAR THREE TIMES DAILY DX: E11.51 300 strip 11   pantoprazole (PROTONIX) 40 MG tablet TAKE ONE TABLET BY MOUTH ONCE DAILY FOR STOMACH 90 tablet 1   pramipexole (MIRAPEX) 1 MG tablet TAKE  1 TABLET BY MOUTH AT 5PM, 1 TABLET BY MOUTH AT 7PM, AND TAKE 2 TABLETS BY MOUTH AT BEDTIME. 360 tablet 1   valsartan (DIOVAN) 160 MG tablet Take 1 tablet (160 mg total) by mouth every evening. 90 tablet 3   famotidine (PEPCID) 20 MG tablet TAKE 1 TABLET BY MOUTH EVERY DAY 30 tablet 1   No facility-administered medications prior to visit.   Final Medications at End of Visit    Current Meds  Medication Sig   acetaminophen (TYLENOL) 500 MG tablet Take 500 mg by mouth every 6 (six) hours as needed for mild pain, moderate pain, headache or fever.   albuterol (PROVENTIL HFA;VENTOLIN HFA) 108 (90 Base) MCG/ACT inhaler Inhale 2 puffs into the lungs every 6 (six) hours as needed for wheezing or shortness of breath.   Alirocumab (PRALUENT) 150 MG/ML SOAJ Inject 1 pen into the skin every 14 (fourteen) days.   aspirin EC 81 MG tablet Take 1 tablet (81 mg total) by mouth daily. Swallow whole.   BD PEN NEEDLE NANO 2ND GEN 32G X 4 MM MISC USE AS DIRECTED   Cholecalciferol (D-5000) 125 MCG (5000 UT) TABS Take 1 tablet (5,000 Units total) by mouth daily. E55.9   clopidogrel (PLAVIX) 75 MG tablet Take 1 tablet (75 mg total) by mouth daily.    Continuous Blood Gluc Receiver (FREESTYLE LIBRE 14 DAY READER) DEVI Inject 1 Device as directed daily as needed. Use once daily as direct to check blood sugar E11.51   Continuous Blood Gluc Sensor (FREESTYLE LIBRE 14 DAY SENSOR) MISC Use to test blood sugar three times daily. E11.51. E11.59   dapagliflozin propanediol (FARXIGA) 10 MG TABS tablet Take 1 tablet (10 mg total) by mouth daily before breakfast.   fluticasone (FLONASE) 50 MCG/ACT nasal spray Place 1 spray into both nostrils daily as needed for allergies or rhinitis.   gabapentin (NEURONTIN) 100 MG capsule TAKE 2 CAPSULES BY MOUTH AT BEDTIME.   glucose 4 GM chewable tablet Chew 1 tablet (4 g total) by mouth as needed for low blood sugar.   insulin aspart (NOVOLOG FLEXPEN) 100 UNIT/ML FlexPen Inject 15-20 Units into the skin 3 (three) times daily with meals.   Insulin Glargine (BASAGLAR KWIKPEN) 100 UNIT/ML Inject 40 units subcutaneously into the skin at bedtime   isosorbide mononitrate (IMDUR) 60 MG 24 hr tablet Take 1 tablet (60 mg total) by mouth daily.   LIFESCAN FINEPOINT LANCETS MISC Use to test for blood sugar three times daily dx: E11.51   metoprolol succinate (TOPROL-XL) 25 MG 24 hr tablet TAKE 1 TABLET BY MOUTH DAILY. TAKE WITH OR IMMEDIATELY FOLLOWING A MEAL.   nitroGLYCERIN (NITROSTAT) 0.4 MG SL tablet Place 1 tablet (0.4 mg total) under the tongue every 5 (five) minutes as needed for chest pain.   ONETOUCH ULTRA test strip USE TO TEST FOR BLOOD SUGAR THREE TIMES DAILY DX: E11.51   pantoprazole (PROTONIX) 40 MG tablet TAKE ONE TABLET BY MOUTH ONCE DAILY FOR STOMACH   pramipexole (MIRAPEX) 1 MG tablet TAKE 1 TABLET BY MOUTH AT 5PM, 1 TABLET BY MOUTH AT 7PM, AND TAKE 2 TABLETS BY MOUTH AT BEDTIME.   valsartan (DIOVAN) 160 MG tablet Take 1 tablet (160 mg total) by mouth every evening.   Radiology   No results found.   Cardiac Studies:   Coronary angiogram 09/25/2015: Ostial Cx 50-60%, FFR 0.86. LVEF 60%. Patent stent placed  05/03/2014: Proximal LAD 3.0x12 mm Promus DES. 04/08/2014: Cx/OM1 with 3.5 x 16 mm Promus DES. H/O  Mid LAD 2.75 x 38 mm Promus Premier DES, balloon angioplasty of D1 and distal LAD(03/29/2013) and Distal RCA stent (12/15/2012) with 2.75 x 24 mm promos Premier DES.  Lower extremity venous duplex for insufficiency 05/29/2017: Bilateral lower extremities negative for DVT or reflux.  Coronary angiogram 05/30/2019 and 05/23/2017: All stents placed previously are patent.  Proximal to mid LAD long stent (Overlaps prior stent) that was placed on 04/08/2014, circumflex/obtuse marginal-1 stenting. History of mid LAD stenting and balloon angioplasty to D1 and distal LAD performed on 03/29/2013, distal RCA stent in 2014. No change in ostial circumflex lesion, the mid LAD stent is widely patent however at the outflow, there is a 30-40% stenosis.  Lower Extremity Arterial Duplex 06/22/2020: No hemodynamically significant stenoses are identified in the right or left lower extremity arterial system. Mildly  abnormal biphasic waveform noted throughout the lower extremity. The right and left mid posterior tibial and mid anterior tibial arteries are non-compressible due to medial arteriosclerosis.   PCV ECHOCARDIOGRAM COMPLETE 123456 Normal LV systolic function with visual EF 60-65%. Left ventricle cavity is normal in size. Mild left ventricular hypertrophy. Normal global wall motion. Normal diastolic filling pattern, normal LAP. Mild (Grade I) aortic regurgitation. Mild tricuspid regurgitation. No evidence of pulmonary hypertension. Compared to study 06/22/2020 MR is now resolved otherwise no significant change.   PCV MYOCARDIAL PERFUSION WO LEXISCAN 03/19/2021 Abnormal ECG stress. The patient exercised for 5 minutes and 58 seconds of a Bruce protocol, achieving approximately 6.99 METs.  Resting EKG shows PRWP. Peak EKG revealed no ST-T wave abnormalities. Recovery EKG revealed 1 mm horizontal ST depression of the  inferolateral leads. This is abnormal response and mildly positive for ischemia. Normal BP response. Myocardial perfusion is normal. There is mild diaphragmatic attenuation. Overall LV systolic function is normal without regional wall motion abnormalities. Stress LV EF: 66%. Compared to previous study: 2019/05/31, normal EKG response and no ischemia by nuclear perfusion, no chest pain. Low risk. Clinical correlation recommended.  EKG  04/11/2021: Sinus rhythm with single PAC at a rate of 74 bpm.  Left axis.  Poor refreshing, cannot exclude anteroseptal infarct old.  Borderline criteria for LVH.  Nonspecific T wave abnormality.  Compared EKG 02/06/2021, no signs of acute change.  Assessment     ICD-10-CM   1. Stable angina pectoris (H[facility])  I20.8 EKG 12-Lead    2. Coronary artery disease of native artery of native heart with stable angina pectoris ([place])  I25.118     3. Essential hypertension  I10      Meds ordered this encounter  Medications   isosorbide mononitrate (IMDUR) 60 MG 24 hr tablet    Sig: Take 1 tablet (60 mg total) by mouth daily.    Dispense:  30 tablet    Refill:  3   aspirin EC 81 MG tablet    Sig: Take 1 tablet (81 mg total) by mouth daily. Swallow whole.    Dispense:  90 tablet    Refill:  3   clopidogrel (PLAVIX) 75 MG tablet    Sig: Take 1 tablet (75 mg total) by mouth daily.    Dispense:  90 tablet    Refill:  3     Medications Discontinued During This Encounter  Medication Reason   famotidine (PEPCID) 20 MG tablet      Recommendations:   Navi Suguitan Sr.  is a 71 y.o. caucasian male with coronary artery disease, last cardiac catheterization on 05/29/2017 when he presented with unstable anginal-like symptoms revealing widely  patent stent to his LAD, RCA and circumflex and also balloon angioplasty site to D1 and distal LAD.  Fortunately his last PCI has been in 2016.  Past medical history significant for hypertension, hyperlipidemia, GERD, Barrett's esophagus,  statin intolerance, uncontrolled diabetes mellitus and also severe restless leg syndrome.  He also had florid COVID 19 infection in February 2021.  Patient was last seen in our office by Dr. Einar Gip 02/05/2021 at which time a nuclear stress test was ordered, which revealed abnormal EKG response suggestive of small vessel disease or significant CAD but overall given normal LV systolic function and normal perfusion, low to at most intermediate risk nuclear study.  Dr. Einar Gip discussed results with patient and shared decision was to hold off on cardiac catheterization.  Patient now presents for urgent visit at his request with complaints of chest pain.  Again reviewed and discussed results of stress test, details above. EKG today is unchanged compared to previous. Patient unfortunately continues to have symptoms concerning for angina pectoris which are stable.  We will therefore add Imdur 60 mg daily given uncontrolled hypertension as well.  EKG today is unchanged compared to previous.  We will plan to follow-up closely and reevaluate chest pain symptoms at next office visit.  If patient continues to have anginal symptoms with addition of Imdur would recommend proceeding with cardiac catheterization.  Follow-up in 2 weeks, sooner if needed.  Patient was seen in collaboration with Dr. Einar Gip and he is in agreement with the plan.   Alethia Berthold, PA-C 04/11/2021, 1:50 PM Office: [phone]

## 2021-04-23 ENCOUNTER — Other Ambulatory Visit: Payer: Self-pay | Admitting: Cardiology

## 2021-04-23 DIAGNOSIS — I25118 Atherosclerotic heart disease of native coronary artery with other forms of angina pectoris: Secondary | ICD-10-CM

## 2021-04-23 DIAGNOSIS — E7800 Pure hypercholesterolemia, unspecified: Secondary | ICD-10-CM

## 2021-04-25 ENCOUNTER — Ambulatory Visit: Payer: Medicare Other | Admitting: Student

## 2021-04-25 ENCOUNTER — Encounter: Payer: Self-pay | Admitting: Student

## 2021-04-25 ENCOUNTER — Other Ambulatory Visit: Payer: Self-pay

## 2021-04-25 VITALS — BP 151/85 | HR 65 | Temp 97.3°F | Resp 16 | Ht 68.0 in | Wt 204.0 lb

## 2021-04-25 DIAGNOSIS — I10 Essential (primary) hypertension: Secondary | ICD-10-CM

## 2021-04-25 DIAGNOSIS — I208 Other forms of angina pectoris: Secondary | ICD-10-CM

## 2021-04-25 DIAGNOSIS — I25118 Atherosclerotic heart disease of native coronary artery with other forms of angina pectoris: Secondary | ICD-10-CM

## 2021-04-25 MED ORDER — AMLODIPINE BESYLATE 5 MG PO TABS: 5.0000 mg | ORAL_TABLET | Freq: Every day | ORAL | 3 refills | Status: DC

## 2021-04-25 NOTE — Progress Notes (Signed)
Primary Physician/Referring:  Lauree Chandler, NP  Patient ID: Kenneth Lav Sr., male    DOB: 1950/06/27, 71 y.o.   MRN: [id number]  Chief Complaint  Patient presents with   Chest Pain   Follow-up   HPI:    Kenneth Vierling Sr.  is a 71 y.o. caucasian male with coronary artery disease, last cardiac catheterization on 05/29/2017 when he presented with unstable anginal-like symptoms revealing widely patent stent to his LAD, RCA and circumflex and also balloon angioplasty site to D1 and distal LAD. Fortunately his last PCI has been in 2016.  Past medical history significant for hypertension, hyperlipidemia, GERD, Barrett's esophagus, statin intolerance, uncontrolled diabetes mellitus and also severe restless leg syndrome.  Patient was last seen in our office 04/11/2021 with complaints of chest pain, therefore added Imdur 60 mg daily given uncontrolled hypertension and stable angina.  He now presents for follow-up.  Patient continues to have chest pain even with minimal exertion.  He is experiencing anginal symptoms nearly daily and is taking nitroglycerin several times per week.  Blood pressure remains elevated, particularly when patient is experiencing chest pain.  Past Medical History:  Diagnosis Date   Arthritis    "left thumb" (05/23/2017) right shoulder   Barrett's esophagus    "we've been told that it's all gone; still take RX for GERD" (05/23/2017)   Bilateral swelling of feet and ankles x 3 weeks as of 12-01-2019   CAD (coronary artery disease), native coronary artery    Coronary angiogram 05/03/2014:  Proximal LAD 3.0x12 mm Promus premier stent.  04/08/2014: Mid Cx 3.5 x 16 mm Promus DES.  03/29/2013: Mid LAD 2.75 x 38 mm Promus Premier drug-eluting stent, balloon angioplasty of D1 and distal LAD and stenting of distal RCA with 2.75 x 24 mm promos Premier drug-eluting stent 12/15/2012 widely patent.   Cervicalgia    Chronic bronchitis ([place])    "q yr" (Q000111Q)   Complication of anesthesia    "  I do not wake up very well "  slow to wake up   COVID-19 05/2019   all covid symptoms in hospital x 5 days, all symptoms resolved took monoclonal antibody tx    Dyspnea    with exertion   Family history of adverse reaction to anesthesia    "son w/PONV"   GERD (gastroesophageal reflux disease)    Heart murmur    "grew out of it"    Hyperlipidemia    Hypertension    Hypoglycemia, unspecified    IDDM (insulin dependent diabetes mellitus)    type 2   Impotence of organic origin    Iron deficiency anemia    Memory loss    mild   Other malaise and fatigue    Pneumonia 05/2019   PONV (postoperative nausea and vomiting)    after wisdom teeth pulled, no problems with other surgeries   Restless leg    Rotator cuff arthropathy    Stroke ([place])    Tension headache    "sometimes" (05/23/2017)   Unspecified gastritis and gastroduodenitis with hemorrhage    Unstable angina pectoris ([place]) 04/08/2014   Coronary angiogram 05/03/2014:  Proximal LAD 3.0x12 mm Promus premier stent.  04/08/2014: Mid Cx 3.5 x 16 mm Promus DES.  03/29/2013: Mid LAD 2.75 x 38 mm Promus Premier drug-eluting stent, balloon angioplasty of D1 and distal LAD and stenting of distal RCA with 2.75 x 24 mm promos Premier drug-eluting stent 12/15/2012 widely patent.   Past Surgical History:  Procedure Laterality Date  BICEPT TENODESIS Right 12/07/2019   Procedure: RIGHT BICEPS TENODESIS;  Surgeon: Renette Butters, MD;  Location: [facility];  Service: Orthopedics;  Laterality: Right;   CARDIAC CATHETERIZATION N/A 09/25/2015   Procedure: Left Heart Cath and Coronary Angiography;  Surgeon: Adrian Prows, MD;  Location: [place] [facility];  Service: Cardiovascular;  Laterality: N/A;   CARDIAC CATHETERIZATION N/A 09/25/2015   Procedure: Intravascular Pressure Wire/FFR Study;  Surgeon: Adrian Prows, MD;  Location: [place] [facility];  Service: Cardiovascular;  Laterality: N/A;   CORONARY ANGIOPLASTY WITH STENT PLACEMENT   12/15/2012; 04/28/2014; 05/03/2014   "2; 1; 1" , 4 stents 2 balloons   FRACTIONAL FLOW RESERVE WIRE Right 03/26/2013   Procedure: FRACTIONAL FLOW RESERVE WIRE;  Surgeon: Laverda Page, MD;  Location: [facility] CATH LAB;  Service: Cardiovascular;  Laterality: Right;   FRACTIONAL FLOW RESERVE WIRE N/A 05/03/2014   Procedure: FRACTIONAL FLOW RESERVE WIRE;  Surgeon: Laverda Page, MD;  Location: [facility] CATH LAB;  Service: Cardiovascular;  Laterality: N/A;   HERNIA REPAIR  [id number]   "umbilical"    INCISION AND DRAINAGE ABSCESS Left 05/2007   "groin"    KNEE SURGERY Right 1992;  2000   "calcium deposits removed" (12/15/2012)   LEFT HEART CATH AND CORONARY ANGIOGRAPHY N/A 05/23/2017   Procedure: LEFT HEART CATH AND CORONARY ANGIOGRAPHY;  Surgeon: Adrian Prows, MD;  Location: [place] [facility];  Service: Cardiovascular;  Laterality: N/A;   LEFT HEART CATHETERIZATION WITH CORONARY ANGIOGRAM N/A 12/15/2012   Procedure: LEFT HEART CATHETERIZATION WITH CORONARY ANGIOGRAM;  Surgeon: Laverda Page, MD;  Location: [facility] CATH LAB;  Service: Cardiovascular;  Laterality: N/A;   LEFT HEART CATHETERIZATION WITH CORONARY ANGIOGRAM N/A 03/26/2013   Procedure: LEFT HEART CATHETERIZATION WITH CORONARY ANGIOGRAM;  Surgeon: Laverda Page, MD;  Location: [facility] CATH LAB;  Service: Cardiovascular;  Laterality: N/A;   LEFT HEART CATHETERIZATION WITH CORONARY ANGIOGRAM N/A 04/08/2014   Procedure: LEFT HEART CATHETERIZATION WITH CORONARY ANGIOGRAM;  Surgeon: Blane Ohara, MD;  Location: [facility] CATH LAB;  Service: Cardiovascular;  Laterality: N/A;   PERCUTANEOUS CORONARY STENT INTERVENTION (PCI-S) N/A 05/03/2014   Procedure: PERCUTANEOUS CORONARY STENT INTERVENTION (PCI-S);  Surgeon: Laverda Page, MD;  Location: [facility] CATH LAB;  Service: Cardiovascular;  Laterality: N/A;   RADIOLOGY WITH ANESTHESIA N/A 02/08/2021   Procedure: MRI WITH ANESTHESIA OF ABDOMEN WITH AND WITHOUT CONSTRAST;  Surgeon: Radiologist, Medication, MD;  Location: [place];   Service: Radiology;  Laterality: N/A;   ROTATOR CUFF REPAIR     UMBILICAL HERNIA REPAIR  yrs ago   Family History  Adopted: Yes  Problem Relation Age of Onset   Emphysema Mother    Social History   Tobacco Use   Smoking status: Never   Smokeless tobacco: Never  Substance Use Topics   Alcohol use: No     ROS  Review of Systems  Cardiovascular:  Positive for chest pain, claudication (stable) and dyspnea on exertion. Negative for leg swelling, near-syncope, orthopnea, palpitations, paroxysmal nocturnal dyspnea and syncope.  Respiratory:  Negative for shortness of breath.   Neurological:  Negative for dizziness.   Objective   Vitals with BMI 04/25/2021 04/11/2021 03/23/2021  Height 5\' 8"  5\' 8"  5\' 8"   Weight 204 lbs 203 lbs 10 oz 201 lbs 3 oz  BMI 31.03 [id number]  Systolic 123XX123 0000000 0000000  Diastolic 85 88 84  Pulse 65 66 84    Blood pressure (!) 151/85, pulse 65, temperature (!) 97.3 F (36.3 C),  temperature source Temporal, resp. rate 16, height 5\' 8"  (1.727 m), weight 204 lb (92.5 kg), SpO2 97 %. Body mass index is 31.02 kg/m.   Physical Exam Vitals reviewed.  Constitutional:      Appearance: He is well-developed.  Neck:     Vascular: No carotid bruit.  Cardiovascular:     Rate and Rhythm: Normal rate and regular rhythm.     Pulses:          Carotid pulses are 2+ on the right side and 2+ on the left side.      Femoral pulses are 2+ on the right side and 2+ on the left side.      Dorsalis pedis pulses are 0 on the right side and 0 on the left side.       Posterior tibial pulses are 1+ on the right side and 0 on the left side.     Heart sounds: Murmur heard.  Blowing decrescendo early diastolic murmur is present at the upper right sternal border radiating to the apex.    No gallop.     Comments: No JVD. Pulmonary:     Effort: Pulmonary effort is normal.     Breath sounds: Normal breath sounds.  Musculoskeletal:     Right lower leg: Edema (trace) present.     Left lower  leg: Edema (trace) present.   Laboratory examination:   Recent Labs    06/30/20 0808 01/03/21 0825 02/09/21 0240 02/10/21 1026 02/11/21 1137 02/21/21 0912  NA 137   < > 132* 132* 133* 136  K 5.2   < > 5.1 4.2 4.5 4.9  CL 102   < > 98 100 100 102  CO2 30   < > 26 24 24 27   GLUCOSE 152*   < > 327* 215* 260* 263*  BUN 25   < > 25* 19 17 20   CREATININE 1.25*   < > 1.52* 1.29* 1.02 1.19  CALCIUM 9.7   < > 8.4* 9.0 9.1 9.3  GFRNONAA 58*   < > 49* 59* >60  --   GFRAA 67  --   --   --   --   --    < > = values in this interval not displayed.   CMP Latest Ref Rng & Units 02/21/2021 02/11/2021 02/10/2021  Glucose 65 - 99 mg/dL 263(H) 260(H) 215(H)  BUN 7 - 25 mg/dL 20 17 19   Creatinine 0.70 - 1.28 mg/dL 1.19 1.02 1.29(H)  Sodium 135 - 146 mmol/L 136 133(L) 132(L)  Potassium 3.5 - 5.3 mmol/L 4.9 4.5 4.2  Chloride 98 - 110 mmol/L 102 100 100  CO2 20 - 32 mmol/L 27 24 24   Calcium 8.6 - 10.3 mg/dL 9.3 9.1 9.0  Total Protein 6.1 - 8.1 g/dL 6.3 6.7 6.4(L)  Total Bilirubin 0.2 - 1.2 mg/dL 0.6 1.4(H) 1.8(H)  Alkaline Phos 38 - 126 U/L - 101 87  AST 10 - 35 U/L 22 53(H) 52(H)  ALT 9 - 46 U/L 37 129(H) 151(H)   CBC Latest Ref Rng & Units 02/21/2021 02/10/2021 02/10/2021  WBC 3.8 - 10.8 Thousand/uL 5.8 - 5.3  Hemoglobin 13.2 - 17.1 g/dL 13.3 - 14.0  Hematocrit 38.5 - 50.0 % 39.3 - 41.5  Platelets 140 - 400 Thousand/uL 182 121(L) 128(L)   Lipid Panel Recent Labs    06/30/20 0808 01/15/21 0811  CHOL 104 81  TRIG 173* 174*  LDLCALC 37 15  VLDL  --  35  HDL 42 31*  CHOLHDL 2.5 2.6    HEMOGLOBIN A1C Lab Results  Component Value Date   HGBA1C 7.9 (H) 01/15/2021   MPG 180.03 01/15/2021   TSH Recent Labs    01/15/21 0811  TSH 2.684     Allergies   Allergies  Allergen Reactions   Lyrica [Pregabalin] Other (See Comments)    Made patient very lethargic the next morning, very hard to patient to function, move, etc.    Statins Other (See Comments)    Causes Restless Legs,  rhabdomyolysis   Ativan [Lorazepam] Other (See Comments)    Markedly increased restless legs   Trulicity [Dulaglutide] Other (See Comments)    GI side effects     Medications Prior to Visit:   Outpatient Medications Prior to Visit  Medication Sig Dispense Refill   acetaminophen (TYLENOL) 500 MG tablet Take 500 mg by mouth every 6 (six) hours as needed for mild pain, moderate pain, headache or fever.     albuterol (PROVENTIL HFA;VENTOLIN HFA) 108 (90 Base) MCG/ACT inhaler Inhale 2 puffs into the lungs every 6 (six) hours as needed for wheezing or shortness of breath. 18 g 1   aspirin EC 81 MG tablet Take 1 tablet (81 mg total) by mouth daily. Swallow whole. 90 tablet 3   BD PEN NEEDLE NANO 2ND GEN 32G X 4 MM MISC USE AS DIRECTED 100 each 3   Cholecalciferol (D-5000) 125 MCG (5000 UT) TABS Take 1 tablet (5,000 Units total) by mouth daily. E55.9 90 tablet 3   clopidogrel (PLAVIX) 75 MG tablet Take 1 tablet (75 mg total) by mouth daily. 90 tablet 3   Continuous Blood Gluc Receiver (FREESTYLE LIBRE 14 DAY READER) DEVI Inject 1 Device as directed daily as needed. Use once daily as direct to check blood sugar E11.51 1 each 0   Continuous Blood Gluc Sensor (FREESTYLE LIBRE 14 DAY SENSOR) MISC Use to test blood sugar three times daily. E11.51. E11.59 2 each 12   fluticasone (FLONASE) 50 MCG/ACT nasal spray Place 1 spray into both nostrils daily as needed for allergies or rhinitis.     gabapentin (NEURONTIN) 100 MG capsule TAKE 2 CAPSULES BY MOUTH AT BEDTIME. 180 capsule 1   glucose 4 GM chewable tablet Chew 1 tablet (4 g total) by mouth as needed for low blood sugar. 50 tablet 12   insulin aspart (NOVOLOG FLEXPEN) 100 UNIT/ML FlexPen Inject 15-20 Units into the skin 3 (three) times daily with meals. 60 mL 3   Insulin Glargine (BASAGLAR KWIKPEN) 100 UNIT/ML Inject 40 units subcutaneously into the skin at bedtime 45 mL 4   isosorbide mononitrate (IMDUR) 60 MG 24 hr tablet Take 1 tablet (60 mg total) by  mouth daily. 30 tablet 3   LIFESCAN FINEPOINT LANCETS MISC Use to test for blood sugar three times daily dx: E11.51 300 each 1   metoprolol succinate (TOPROL-XL) 25 MG 24 hr tablet TAKE 1 TABLET BY MOUTH DAILY. TAKE WITH OR IMMEDIATELY FOLLOWING A MEAL. 90 tablet 1   nitroGLYCERIN (NITROSTAT) 0.4 MG SL tablet Place 1 tablet (0.4 mg total) under the tongue every 5 (five) minutes as needed for chest pain. 50 tablet 0   ONETOUCH ULTRA test strip USE TO TEST FOR BLOOD SUGAR THREE TIMES DAILY DX: E11.51 300 strip 11   pantoprazole (PROTONIX) 40 MG tablet TAKE ONE TABLET BY MOUTH ONCE DAILY FOR STOMACH 90 tablet 1   PRALUENT 150 MG/ML SOAJ INJECT 1 PEN INTO THE SKIN EVERY 14 (FOURTEEN) DAYS. 2 mL 3  pramipexole (MIRAPEX) 1 MG tablet TAKE 1 TABLET BY MOUTH AT 5PM, 1 TABLET BY MOUTH AT 7PM, AND TAKE 2 TABLETS BY MOUTH AT BEDTIME. 360 tablet 1   valsartan (DIOVAN) 160 MG tablet Take 1 tablet (160 mg total) by mouth every evening. 90 tablet 3   dapagliflozin propanediol (FARXIGA) 10 MG TABS tablet Take 1 tablet (10 mg total) by mouth daily before breakfast. 90 tablet 3   No facility-administered medications prior to visit.   Final Medications at End of Visit    Current Meds  Medication Sig   acetaminophen (TYLENOL) 500 MG tablet Take 500 mg by mouth every 6 (six) hours as needed for mild pain, moderate pain, headache or fever.   albuterol (PROVENTIL HFA;VENTOLIN HFA) 108 (90 Base) MCG/ACT inhaler Inhale 2 puffs into the lungs every 6 (six) hours as needed for wheezing or shortness of breath.   amLODipine (NORVASC) 5 MG tablet Take 1 tablet (5 mg total) by mouth daily. Can take an additional 5 mg of amlodipine for blood pressure Q000111Q mmHg stystolic   aspirin EC 81 MG tablet Take 1 tablet (81 mg total) by mouth daily. Swallow whole.   BD PEN NEEDLE NANO 2ND GEN 32G X 4 MM MISC USE AS DIRECTED   Cholecalciferol (D-5000) 125 MCG (5000 UT) TABS Take 1 tablet (5,000 Units total) by mouth daily. E55.9    clopidogrel (PLAVIX) 75 MG tablet Take 1 tablet (75 mg total) by mouth daily.   Continuous Blood Gluc Receiver (FREESTYLE LIBRE 14 DAY READER) DEVI Inject 1 Device as directed daily as needed. Use once daily as direct to check blood sugar E11.51   Continuous Blood Gluc Sensor (FREESTYLE LIBRE 14 DAY SENSOR) MISC Use to test blood sugar three times daily. E11.51. E11.59   fluticasone (FLONASE) 50 MCG/ACT nasal spray Place 1 spray into both nostrils daily as needed for allergies or rhinitis.   gabapentin (NEURONTIN) 100 MG capsule TAKE 2 CAPSULES BY MOUTH AT BEDTIME.   glucose 4 GM chewable tablet Chew 1 tablet (4 g total) by mouth as needed for low blood sugar.   insulin aspart (NOVOLOG FLEXPEN) 100 UNIT/ML FlexPen Inject 15-20 Units into the skin 3 (three) times daily with meals.   Insulin Glargine (BASAGLAR KWIKPEN) 100 UNIT/ML Inject 40 units subcutaneously into the skin at bedtime   isosorbide mononitrate (IMDUR) 60 MG 24 hr tablet Take 1 tablet (60 mg total) by mouth daily.   LIFESCAN FINEPOINT LANCETS MISC Use to test for blood sugar three times daily dx: E11.51   metoprolol succinate (TOPROL-XL) 25 MG 24 hr tablet TAKE 1 TABLET BY MOUTH DAILY. TAKE WITH OR IMMEDIATELY FOLLOWING A MEAL.   nitroGLYCERIN (NITROSTAT) 0.4 MG SL tablet Place 1 tablet (0.4 mg total) under the tongue every 5 (five) minutes as needed for chest pain.   ONETOUCH ULTRA test strip USE TO TEST FOR BLOOD SUGAR THREE TIMES DAILY DX: E11.51   pantoprazole (PROTONIX) 40 MG tablet TAKE ONE TABLET BY MOUTH ONCE DAILY FOR STOMACH   PRALUENT 150 MG/ML SOAJ INJECT 1 PEN INTO THE SKIN EVERY 14 (FOURTEEN) DAYS.   pramipexole (MIRAPEX) 1 MG tablet TAKE 1 TABLET BY MOUTH AT 5PM, 1 TABLET BY MOUTH AT 7PM, AND TAKE 2 TABLETS BY MOUTH AT BEDTIME.   valsartan (DIOVAN) 160 MG tablet Take 1 tablet (160 mg total) by mouth every evening.   Radiology   No results found.   Cardiac Studies:   Coronary angiogram 09/25/2015: Ostial Cx 50-60%,  FFR 0.86. LVEF  60%. Patent stent placed 05/03/2014: Proximal LAD 3.0x12 mm Promus DES. 04/08/2014: Cx/OM1 with 3.5 x 16 mm Promus DES. H/O Mid LAD 2.75 x 38 mm Promus Premier DES, balloon angioplasty of D1 and distal LAD(03/29/2013) and Distal RCA stent (12/15/2012) with 2.75 x 24 mm promos Premier DES.  Lower extremity venous duplex for insufficiency 05/29/2017: Bilateral lower extremities negative for DVT or reflux.  Coronary angiogram 05/30/2019 and 05/23/2017: All stents placed previously are patent.  Proximal to mid LAD long stent (Overlaps prior stent) that was placed on 04/08/2014, circumflex/obtuse marginal-1 stenting. History of mid LAD stenting and balloon angioplasty to D1 and distal LAD performed on 03/29/2013, distal RCA stent in 2014. No change in ostial circumflex lesion, the mid LAD stent is widely patent however at the outflow, there is a 30-40% stenosis.  Lower Extremity Arterial Duplex 06/22/2020: No hemodynamically significant stenoses are identified in the right or left lower extremity arterial system. Mildly  abnormal biphasic waveform noted throughout the lower extremity. The right and left mid posterior tibial and mid anterior tibial arteries are non-compressible due to medial arteriosclerosis.   PCV ECHOCARDIOGRAM COMPLETE 123456 Normal LV systolic function with visual EF 60-65%. Left ventricle cavity is normal in size. Mild left ventricular hypertrophy. Normal global wall motion. Normal diastolic filling pattern, normal LAP. Mild (Grade I) aortic regurgitation. Mild tricuspid regurgitation. No evidence of pulmonary hypertension. Compared to study 06/22/2020 MR is now resolved otherwise no significant change.   PCV MYOCARDIAL PERFUSION WO LEXISCAN 03/19/2021 Abnormal ECG stress. The patient exercised for 5 minutes and 58 seconds of a Bruce protocol, achieving approximately 6.99 METs.  Resting EKG shows PRWP. Peak EKG revealed no ST-T wave abnormalities. Recovery EKG revealed 1  mm horizontal ST depression of the inferolateral leads. This is abnormal response and mildly positive for ischemia. Normal BP response. Myocardial perfusion is normal. There is mild diaphragmatic attenuation. Overall LV systolic function is normal without regional wall motion abnormalities. Stress LV EF: 66%. Compared to previous study: 2019/05/31, normal EKG response and no ischemia by nuclear perfusion, no chest pain. Low risk. Clinical correlation recommended.  EKG   04/11/2021: Sinus rhythm with single PAC at a rate of 74 bpm.  Left axis.  Poor refreshing, cannot exclude anteroseptal infarct old.  Borderline criteria for LVH.  Nonspecific T wave abnormality.  Compared EKG 02/06/2021, no signs of acute change.  Assessment     ICD-10-CM   1. Stable angina pectoris (HCC)  123456 Basic metabolic panel    CBC    2. Coronary artery disease of native artery of native heart with stable angina pectoris ([place])  I25.118     3. Essential hypertension  I10      Meds ordered this encounter  Medications   amLODipine (NORVASC) 5 MG tablet    Sig: Take 1 tablet (5 mg total) by mouth daily. Can take an additional 5 mg of amlodipine for blood pressure Q000111Q mmHg stystolic    Dispense:  30 tablet    Refill:  3     Medications Discontinued During This Encounter  Medication Reason   dapagliflozin propanediol (FARXIGA) 10 MG TABS tablet      Recommendations:   Kenneth Cieslinski Sr.  is a 71 y.o. caucasian male with coronary artery disease, last cardiac catheterization on 05/29/2017 when he presented with unstable anginal-like symptoms revealing widely patent stent to his LAD, RCA and circumflex and also balloon angioplasty site to D1 and distal LAD.  Fortunately his last PCI has been in 2016.  Past medical history significant for hypertension, hyperlipidemia, GERD, Barrett's esophagus, statin intolerance, uncontrolled diabetes mellitus and also severe restless leg syndrome.  He also had florid COVID 19 infection  in February 2021.  Patient was last seen in our office 04/11/2021 with complaints of chest pain, therefore added Imdur 60 mg daily given uncontrolled hypertension and stable angina.  He now presents for follow-up.  Patient continues to have frequent anginal symptoms even with minimal exertion.  Suspect patient's elevated blood pressure is secondary to chest pain and associated anxiety.  Advised patient to monitor blood pressure when he is not having chest pain episodes.  We will add amlodipine both for hypertension control as well as antianginal benefit.  Given ongoing angina despite up titration of medical therapy recommended repeat left heart catheterization.   The left heart catheterization procedure was explained to the patient in detail. The indication, alternatives, risks and benefits were reviewed. Complications including but not limited to bleeding, infection, acute kidney injury, blood transfusion, heart rhythm disturbances, contrast (dye) reaction, damage to the arteries or nerves in the legs or hands, cerebrovascular accident, myocardial infarction, need for emergent bypass surgery, blood clots in the legs, possible need for emergent blood transfusion, and rarely death were reviewed and discussed with the patient. The patient voices understanding and wishes to proceed.  Patient was seen in collaboration with Dr. Einar Gip. He also reviewed patient's chart and examined the patient. Dr. Einar Gip is in agreement of the plan.    Alethia Berthold, PA-C 04/25/2021, 9:14 AM Office: [phone]

## 2021-04-25 NOTE — H&P (View-Only) (Signed)
Primary Physician/Referring:  Lauree Chandler, NP  Patient ID: Kenneth Lav Sr., male    DOB: 1950-11-22, 71 y.o.   MRN: [id number]  Chief Complaint  Patient presents with   Chest Pain   Follow-up   HPI:    Kenneth Viereck Sr.  is a 71 y.o. caucasian male with coronary artery disease, last cardiac catheterization on 05/29/2017 when he presented with unstable anginal-like symptoms revealing widely patent stent to his LAD, RCA and circumflex and also balloon angioplasty site to D1 and distal LAD. Fortunately his last PCI has been in 2016.  Past medical history significant for hypertension, hyperlipidemia, GERD, Barrett's esophagus, statin intolerance, uncontrolled diabetes mellitus and also severe restless leg syndrome.  Patient was last seen in our office 04/11/2021 with complaints of chest pain, therefore added Imdur 60 mg daily given uncontrolled hypertension and stable angina.  He now presents for follow-up.  Patient continues to have chest pain even with minimal exertion.  He is experiencing anginal symptoms nearly daily and is taking nitroglycerin several times per week.  Blood pressure remains elevated, particularly when patient is experiencing chest pain.  Past Medical History:  Diagnosis Date   Arthritis    "left thumb" (05/23/2017) right shoulder   Barrett's esophagus    "we've been told that it's all gone; still take RX for GERD" (05/23/2017)   Bilateral swelling of feet and ankles x 3 weeks as of 12-01-2019   CAD (coronary artery disease), native coronary artery    Coronary angiogram 05/03/2014:  Proximal LAD 3.0x12 mm Promus premier stent.  04/08/2014: Mid Cx 3.5 x 16 mm Promus DES.  03/29/2013: Mid LAD 2.75 x 38 mm Promus Premier drug-eluting stent, balloon angioplasty of D1 and distal LAD and stenting of distal RCA with 2.75 x 24 mm promos Premier drug-eluting stent 12/15/2012 widely patent.   Cervicalgia    Chronic bronchitis ([place])    "q yr" (Q000111Q)   Complication of anesthesia    "  I do not wake up very well "  slow to wake up   COVID-19 05/2019   all covid symptoms in hospital x 5 days, all symptoms resolved took monoclonal antibody tx    Dyspnea    with exertion   Family history of adverse reaction to anesthesia    "son w/PONV"   GERD (gastroesophageal reflux disease)    Heart murmur    "grew out of it"    Hyperlipidemia    Hypertension    Hypoglycemia, unspecified    IDDM (insulin dependent diabetes mellitus)    type 2   Impotence of organic origin    Iron deficiency anemia    Memory loss    mild   Other malaise and fatigue    Pneumonia 05/2019   PONV (postoperative nausea and vomiting)    after wisdom teeth pulled, no problems with other surgeries   Restless leg    Rotator cuff arthropathy    Stroke ([place])    Tension headache    "sometimes" (05/23/2017)   Unspecified gastritis and gastroduodenitis with hemorrhage    Unstable angina pectoris ([place]) 04/08/2014   Coronary angiogram 05/03/2014:  Proximal LAD 3.0x12 mm Promus premier stent.  04/08/2014: Mid Cx 3.5 x 16 mm Promus DES.  03/29/2013: Mid LAD 2.75 x 38 mm Promus Premier drug-eluting stent, balloon angioplasty of D1 and distal LAD and stenting of distal RCA with 2.75 x 24 mm promos Premier drug-eluting stent 12/15/2012 widely patent.   Past Surgical History:  Procedure Laterality Date  BICEPT TENODESIS Right 12/07/2019   Procedure: RIGHT BICEPS TENODESIS;  Surgeon: Renette Butters, MD;  Location: [facility];  Service: Orthopedics;  Laterality: Right;   CARDIAC CATHETERIZATION N/A 09/25/2015   Procedure: Left Heart Cath and Coronary Angiography;  Surgeon: Adrian Prows, MD;  Location: [place] [facility];  Service: Cardiovascular;  Laterality: N/A;   CARDIAC CATHETERIZATION N/A 09/25/2015   Procedure: Intravascular Pressure Wire/FFR Study;  Surgeon: Adrian Prows, MD;  Location: [place] [facility];  Service: Cardiovascular;  Laterality: N/A;   CORONARY ANGIOPLASTY WITH STENT PLACEMENT   12/15/2012; 04/28/2014; 05/03/2014   "2; 1; 1" , 4 stents 2 balloons   FRACTIONAL FLOW RESERVE WIRE Right 03/26/2013   Procedure: FRACTIONAL FLOW RESERVE WIRE;  Surgeon: Laverda Page, MD;  Location: [facility] C[facility] LAB;  Service: Cardiovascular;  Laterality: Right;   FRACTIONAL FLOW RESERVE WIRE N/A 05/03/2014   Procedure: FRACTIONAL FLOW RESERVE WIRE;  Surgeon: Laverda Page, MD;  Location: [facility] CATH LAB;  Service: Cardiovascular;  Laterality: N/A;   HERNIA REPAIR  [id number]   "umbilical"    INCISION AND DRAINAGE ABSCESS Left 05/2007   "groin"    KNEE SURGERY Right 1992;  2000   "calcium deposits removed" (12/15/2012)   LEFT HEART CATH AND CORONARY ANGIOGRAPHY N/A 05/23/2017   Procedure: LEFT HEART CATH AND CORONARY ANGIOGRAPHY;  Surgeon: Adrian Prows, MD;  Location: [place] [facility];  Service: Cardiovascular;  Laterality: N/A;   LEFT HEART CATHETERIZATION WITH CORONARY ANGIOGRAM N/A 12/15/2012   Procedure: LEFT HEART CATHETERIZATION WITH CORONARY ANGIOGRAM;  Surgeon: Laverda Page, MD;  Location: [facility] CATH LAB;  Service: Cardiovascular;  Laterality: N/A;   LEFT HEART CATHETERIZATION WITH CORONARY ANGIOGRAM N/A 03/26/2013   Procedure: LEFT HEART CATHETERIZATION WITH CORONARY ANGIOGRAM;  Surgeon: Laverda Page, MD;  Location: [facility] CATH LAB;  Service: Cardiovascular;  Laterality: N/A;   LEFT HEART CATHETERIZATION WITH CORONARY ANGIOGRAM N/A 04/08/2014   Procedure: LEFT HEART CATHETERIZATION WITH CORONARY ANGIOGRAM;  Surgeon: Blane Ohara, MD;  Location: [facility] CATH LAB;  Service: Cardiovascular;  Laterality: N/A;   PERCUTANEOUS CORONARY STENT INTERVENTION (PCI-S) N/A 05/03/2014   Procedure: PERCUTANEOUS CORONARY STENT INTERVENTION (PCI-S);  Surgeon: Laverda Page, MD;  Location: [facility] CATH LAB;  Service: Cardiovascular;  Laterality: N/A;   RADIOLOGY WITH ANESTHESIA N/A 02/08/2021   Procedure: MRI WITH ANESTHESIA OF ABDOMEN WITH AND WITHOUT CONSTRAST;  Surgeon: Radiologist, Medication, MD;  Location: [place];   Service: Radiology;  Laterality: N/A;   ROTATOR CUFF REPAIR     UMBILICAL HERNIA REPAIR  yrs ago   Family History  Adopted: Yes  Problem Relation Age of Onset   Emphysema Mother    Social History   Tobacco Use   Smoking status: Never   Smokeless tobacco: Never  Substance Use Topics   Alcohol use: No     ROS  Review of Systems  Cardiovascular:  Positive for chest pain, claudication (stable) and dyspnea on exertion. Negative for leg swelling, near-syncope, orthopnea, palpitations, paroxysmal nocturnal dyspnea and syncope.  Respiratory:  Negative for shortness of breath.   Neurological:  Negative for dizziness.   Objective   Vitals with BMI 04/25/2021 04/11/2021 03/23/2021  Height 5\' 8"  5\' 8"  5\' 8"   Weight 204 lbs 203 lbs 10 oz 201 lbs 3 oz  BMI 31.03 [id number]  Systolic 123XX123 0000000 0000000  Diastolic 85 88 84  Pulse 65 66 84    Blood pressure (!) 151/85, pulse 65, temperature (!) 97.3 F (36.3 C),  temperature source Temporal, resp. rate 16, height 5\' 8"  (1.727 m), weight 204 lb (92.5 kg), SpO2 97 %. Body mass index is 31.02 kg/m.   Physical Exam Vitals reviewed.  Constitutional:      Appearance: He is well-developed.  Neck:     Vascular: No carotid bruit.  Cardiovascular:     Rate and Rhythm: Normal rate and regular rhythm.     Pulses:          Carotid pulses are 2+ on the right side and 2+ on the left side.      Femoral pulses are 2+ on the right side and 2+ on the left side.      Dorsalis pedis pulses are 0 on the right side and 0 on the left side.       Posterior tibial pulses are 1+ on the right side and 0 on the left side.     Heart sounds: Murmur heard.  Blowing decrescendo early diastolic murmur is present at the upper right sternal border radiating to the apex.    No gallop.     Comments: No JVD. Pulmonary:     Effort: Pulmonary effort is normal.     Breath sounds: Normal breath sounds.  Musculoskeletal:     Right lower leg: Edema (trace) present.     Left lower  leg: Edema (trace) present.   Laboratory examination:   Recent Labs    06/30/20 0808 01/03/21 0825 02/09/21 0240 02/10/21 1026 02/11/21 1137 02/21/21 0912  NA 137   < > 132* 132* 133* 136  K 5.2   < > 5.1 4.2 4.5 4.9  CL 102   < > 98 100 100 102  CO2 30   < > 26 24 24 27   GLUCOSE 152*   < > 327* 215* 260* 263*  BUN 25   < > 25* 19 17 20   CREATININE 1.25*   < > 1.52* 1.29* 1.02 1.19  CALCIUM 9.7   < > 8.4* 9.0 9.1 9.3  GFRNONAA 58*   < > 49* 59* >60  --   GFRAA 67  --   --   --   --   --    < > = values in this interval not displayed.   CMP Latest Ref Rng & Units 02/21/2021 02/11/2021 02/10/2021  Glucose 65 - 99 mg/dL 263(H) 260(H) 215(H)  BUN 7 - 25 mg/dL 20 17 19   Creatinine 0.70 - 1.28 mg/dL 1.19 1.02 1.29(H)  Sodium 135 - 146 mmol/L 136 133(L) 132(L)  Potassium 3.5 - 5.3 mmol/L 4.9 4.5 4.2  Chloride 98 - 110 mmol/L 102 100 100  CO2 20 - 32 mmol/L 27 24 24   Calcium 8.6 - 10.3 mg/dL 9.3 9.1 9.0  Total Protein 6.1 - 8.1 g/dL 6.3 6.7 6.4(L)  Total Bilirubin 0.2 - 1.2 mg/dL 0.6 1.4(H) 1.8(H)  Alkaline Phos 38 - 126 U/L - 101 87  AST 10 - 35 U/L 22 53(H) 52(H)  ALT 9 - 46 U/L 37 129(H) 151(H)   CBC Latest Ref Rng & Units 02/21/2021 02/10/2021 02/10/2021  WBC 3.8 - 10.8 Thousand/uL 5.8 - 5.3  Hemoglobin 13.2 - 17.1 g/dL 13.3 - 14.0  Hematocrit 38.5 - 50.0 % 39.3 - 41.5  Platelets 140 - 400 Thousand/uL 182 121(L) 128(L)   Lipid Panel Recent Labs    06/30/20 0808 01/15/21 0811  CHOL 104 81  TRIG 173* 174*  LDLCALC 37 15  VLDL  --  35  HDL 42 31*  CHOLHDL 2.5 2.6    HEMOGLOBIN A1C Lab Results  Component Value Date   HGBA1C 7.9 (H) 01/15/2021   MPG 180.03 01/15/2021   TSH Recent Labs    01/15/21 0811  TSH 2.684     Allergies   Allergies  Allergen Reactions   Lyrica [Pregabalin] Other (See Comments)    Made patient very lethargic the next morning, very hard to patient to function, move, etc.    Statins Other (See Comments)    Causes Restless Legs,  rhabdomyolysis   Ativan [Lorazepam] Other (See Comments)    Markedly increased restless legs   Trulicity [Dulaglutide] Other (See Comments)    GI side effects     Medications Prior to Visit:   Outpatient Medications Prior to Visit  Medication Sig Dispense Refill   acetaminophen (TYLENOL) 500 MG tablet Take 500 mg by mouth every 6 (six) hours as needed for mild pain, moderate pain, headache or fever.     albuterol (PROVENTIL HFA;VENTOLIN HFA) 108 (90 Base) MCG/ACT inhaler Inhale 2 puffs into the lungs every 6 (six) hours as needed for wheezing or shortness of breath. 18 g 1   aspirin EC 81 MG tablet Take 1 tablet (81 mg total) by mouth daily. Swallow whole. 90 tablet 3   BD PEN NEEDLE NANO 2ND GEN 32G X 4 MM MISC USE AS DIRECTED 100 each 3   Cholecalciferol (D-5000) 125 MCG (5000 UT) TABS Take 1 tablet (5,000 Units total) by mouth daily. E55.9 90 tablet 3   clopidogrel (PLAVIX) 75 MG tablet Take 1 tablet (75 mg total) by mouth daily. 90 tablet 3   Continuous Blood Gluc Receiver (FREESTYLE LIBRE 14 DAY READER) DEVI Inject 1 Device as directed daily as needed. Use once daily as direct to check blood sugar E11.51 1 each 0   Continuous Blood Gluc Sensor (FREESTYLE LIBRE 14 DAY SENSOR) MISC Use to test blood sugar three times daily. E11.51. E11.59 2 each 12   fluticasone (FLONASE) 50 MCG/ACT nasal spray Place 1 spray into both nostrils daily as needed for allergies or rhinitis.     gabapentin (NEURONTIN) 100 MG capsule TAKE 2 CAPSULES BY MOUTH AT BEDTIME. 180 capsule 1   glucose 4 GM chewable tablet Chew 1 tablet (4 g total) by mouth as needed for low blood sugar. 50 tablet 12   insulin aspart (NOVOLOG FLEXPEN) 100 UNIT/ML FlexPen Inject 15-20 Units into the skin 3 (three) times daily with meals. 60 mL 3   Insulin Glargine (BASAGLAR KWIKPEN) 100 UNIT/ML Inject 40 units subcutaneously into the skin at bedtime 45 mL 4   isosorbide mononitrate (IMDUR) 60 MG 24 hr tablet Take 1 tablet (60 mg total) by  mouth daily. 30 tablet 3   LIFESCAN FINEPOINT LANCETS MISC Use to test for blood sugar three times daily dx: E11.51 300 each 1   metoprolol succinate (TOPROL-XL) 25 MG 24 hr tablet TAKE 1 TABLET BY MOUTH DAILY. TAKE WITH OR IMMEDIATELY FOLLOWING A MEAL. 90 tablet 1   nitroGLYCERIN (NITROSTAT) 0.4 MG SL tablet Place 1 tablet (0.4 mg total) under the tongue every 5 (five) minutes as needed for chest pain. 50 tablet 0   ONETOUCH ULTRA test strip USE TO TEST FOR BLOOD SUGAR THREE TIMES DAILY DX: E11.51 300 strip 11   pantoprazole (PROTONIX) 40 MG tablet TAKE ONE TABLET BY MOUTH ONCE DAILY FOR STOMACH 90 tablet 1   PRALUENT 150 MG/ML SOAJ INJECT 1 PEN INTO THE SKIN EVERY 14 (FOURTEEN) DAYS. 2 mL 3  pramipexole (MIRAPEX) 1 MG tablet TAKE 1 TABLET BY MOUTH AT 5PM, 1 TABLET BY MOUTH AT 7PM, AND TAKE 2 TABLETS BY MOUTH AT BEDTIME. 360 tablet 1   valsartan (DIOVAN) 160 MG tablet Take 1 tablet (160 mg total) by mouth every evening. 90 tablet 3   dapagliflozin propanediol (FARXIGA) 10 MG TABS tablet Take 1 tablet (10 mg total) by mouth daily before breakfast. 90 tablet 3   No facility-administered medications prior to visit.   Final Medications at End of Visit    Current Meds  Medication Sig   acetaminophen (TYLENOL) 500 MG tablet Take 500 mg by mouth every 6 (six) hours as needed for mild pain, moderate pain, headache or fever.   albuterol (PROVENTIL HFA;VENTOLIN HFA) 108 (90 Base) MCG/ACT inhaler Inhale 2 puffs into the lungs every 6 (six) hours as needed for wheezing or shortness of breath.   amLODipine (NORVASC) 5 MG tablet Take 1 tablet (5 mg total) by mouth daily. Can take an additional 5 mg of amlodipine for blood pressure Q000111Q mmHg stystolic   aspirin EC 81 MG tablet Take 1 tablet (81 mg total) by mouth daily. Swallow whole.   BD PEN NEEDLE NANO 2ND GEN 32G X 4 MM MISC USE AS DIRECTED   Cholecalciferol (D-5000) 125 MCG (5000 UT) TABS Take 1 tablet (5,000 Units total) by mouth daily. E55.9    clopidogrel (PLAVIX) 75 MG tablet Take 1 tablet (75 mg total) by mouth daily.   Continuous Blood Gluc Receiver (FREESTYLE LIBRE 14 DAY READER) DEVI Inject 1 Device as directed daily as needed. Use once daily as direct to check blood sugar E11.51   Continuous Blood Gluc Sensor (FREESTYLE LIBRE 14 DAY SENSOR) MISC Use to test blood sugar three times daily. E11.51. E11.59   fluticasone (FLONASE) 50 MCG/ACT nasal spray Place 1 spray into both nostrils daily as needed for allergies or rhinitis.   gabapentin (NEURONTIN) 100 MG capsule TAKE 2 CAPSULES BY MOUTH AT BEDTIME.   glucose 4 GM chewable tablet Chew 1 tablet (4 g total) by mouth as needed for low blood sugar.   insulin aspart (NOVOLOG FLEXPEN) 100 UNIT/ML FlexPen Inject 15-20 Units into the skin 3 (three) times daily with meals.   Insulin Glargine (BASAGLAR KWIKPEN) 100 UNIT/ML Inject 40 units subcutaneously into the skin at bedtime   isosorbide mononitrate (IMDUR) 60 MG 24 hr tablet Take 1 tablet (60 mg total) by mouth daily.   LIFESCAN FINEPOINT LANCETS MISC Use to test for blood sugar three times daily dx: E11.51   metoprolol succinate (TOPROL-XL) 25 MG 24 hr tablet TAKE 1 TABLET BY MOUTH DAILY. TAKE WITH OR IMMEDIATELY FOLLOWING A MEAL.   nitroGLYCERIN (NITROSTAT) 0.4 MG SL tablet Place 1 tablet (0.4 mg total) under the tongue every 5 (five) minutes as needed for chest pain.   ONETOUCH ULTRA test strip USE TO TEST FOR BLOOD SUGAR THREE TIMES DAILY DX: E11.51   pantoprazole (PROTONIX) 40 MG tablet TAKE ONE TABLET BY MOUTH ONCE DAILY FOR STOMACH   PRALUENT 150 MG/ML SOAJ INJECT 1 PEN INTO THE SKIN EVERY 14 (FOURTEEN) DAYS.   pramipexole (MIRAPEX) 1 MG tablet TAKE 1 TABLET BY MOUTH AT 5PM, 1 TABLET BY MOUTH AT 7PM, AND TAKE 2 TABLETS BY MOUTH AT BEDTIME.   valsartan (DIOVAN) 160 MG tablet Take 1 tablet (160 mg total) by mouth every evening.   Radiology   No results found.   Cardiac Studies:   Coronary angiogram 09/25/2015: Ostial Cx 50-60%,  FFR 0.86. LVEF  60%. Patent stent placed 05/03/2014: Proximal LAD 3.0x12 mm Promus DES. 04/08/2014: Cx/OM1 with 3.5 x 16 mm Promus DES. H/O Mid LAD 2.75 x 38 mm Promus Premier DES, balloon angioplasty of D1 and distal LAD(03/29/2013) and Distal RCA stent (12/15/2012) with 2.75 x 24 mm promos Premier DES.  Lower extremity venous duplex for insufficiency 05/29/2017: Bilateral lower extremities negative for DVT or reflux.  Coronary angiogram 05/30/2019 and 05/23/2017: All stents placed previously are patent.  Proximal to mid LAD long stent (Overlaps prior stent) that was placed on 04/08/2014, circumflex/obtuse marginal-1 stenting. History of mid LAD stenting and balloon angioplasty to D1 and distal LAD performed on 03/29/2013, distal RCA stent in 2014. No change in ostial circumflex lesion, the mid LAD stent is widely patent however at the outflow, there is a 30-40% stenosis.  Lower Extremity Arterial Duplex 06/22/2020: No hemodynamically significant stenoses are identified in the right or left lower extremity arterial system. Mildly  abnormal biphasic waveform noted throughout the lower extremity. The right and left mid posterior tibial and mid anterior tibial arteries are non-compressible due to medial arteriosclerosis.   PCV ECHOCARDIOGRAM COMPLETE 123456 Normal LV systolic function with visual EF 60-65%. Left ventricle cavity is normal in size. Mild left ventricular hypertrophy. Normal global wall motion. Normal diastolic filling pattern, normal LAP. Mild (Grade I) aortic regurgitation. Mild tricuspid regurgitation. No evidence of pulmonary hypertension. Compared to study 06/22/2020 MR is now resolved otherwise no significant change.   PCV MYOCARDIAL PERFUSION WO LEXISCAN 03/19/2021 Abnormal ECG stress. The patient exercised for 5 minutes and 58 seconds of a Bruce protocol, achieving approximately 6.99 METs.  Resting EKG shows PRWP. Peak EKG revealed no ST-T wave abnormalities. Recovery EKG revealed 1  mm horizontal ST depression of the inferolateral leads. This is abnormal response and mildly positive for ischemia. Normal BP response. Myocardial perfusion is normal. There is mild diaphragmatic attenuation. Overall LV systolic function is normal without regional wall motion abnormalities. Stress LV EF: 66%. Compared to previous study: 2019/05/31, normal EKG response and no ischemia by nuclear perfusion, no chest pain. Low risk. Clinical correlation recommended.  EKG   04/11/2021: Sinus rhythm with single PAC at a rate of 74 bpm.  Left axis.  Poor refreshing, cannot exclude anteroseptal infarct old.  Borderline criteria for LVH.  Nonspecific T wave abnormality.  Compared EKG 02/06/2021, no signs of acute change.  Assessment     ICD-10-CM   1. Stable angina pectoris (HCC)  123456 Basic metabolic panel    CBC    2. Coronary artery disease of native artery of native heart with stable angina pectoris ([place])  I25.118     3. Essential hypertension  I10      Meds ordered this encounter  Medications   amLODipine (NORVASC) 5 MG tablet    Sig: Take 1 tablet (5 mg total) by mouth daily. Can take an additional 5 mg of amlodipine for blood pressure Q000111Q mmHg stystolic    Dispense:  30 tablet    Refill:  3     Medications Discontinued During This Encounter  Medication Reason   dapagliflozin propanediol (FARXIGA) 10 MG TABS tablet      Recommendations:   Kenneth Gens Sr.  is a 71 y.o. caucasian male with coronary artery disease, last cardiac catheterization on 05/29/2017 when he presented with unstable anginal-like symptoms revealing widely patent stent to his LAD, RCA and circumflex and also balloon angioplasty site to D1 and distal LAD.  Fortunately his last PCI has been in 2016.  Past medical history significant for hypertension, hyperlipidemia, GERD, Barrett's esophagus, statin intolerance, uncontrolled diabetes mellitus and also severe restless leg syndrome.  He also had florid COVID 19 infection  in February 2021.  Patient was last seen in our office 04/11/2021 with complaints of chest pain, therefore added Imdur 60 mg daily given uncontrolled hypertension and stable angina.  He now presents for follow-up.  Patient continues to have frequent anginal symptoms even with minimal exertion.  Suspect patient's elevated blood pressure is secondary to chest pain and associated anxiety.  Advised patient to monitor blood pressure when he is not having chest pain episodes.  We will add amlodipine both for hypertension control as well as antianginal benefit.  Given ongoing angina despite up titration of medical therapy recommended repeat left heart catheterization.   The left heart catheterization procedure was explained to the patient in detail. The indication, alternatives, risks and benefits were reviewed. Complications including but not limited to bleeding, infection, acute kidney injury, blood transfusion, heart rhythm disturbances, contrast (dye) reaction, damage to the arteries or nerves in the legs or hands, cerebrovascular accident, myocardial infarction, need for emergent bypass surgery, blood clots in the legs, possible need for emergent blood transfusion, and rarely death were reviewed and discussed with the patient. The patient voices understanding and wishes to proceed.  Patient was seen in collaboration with Dr. Einar Gip. He also reviewed patient's chart and examined the patient. Dr. Einar Gip is in agreement of the plan.    Alethia Berthold, PA-C 04/25/2021, 9:14 AM Office: [phone]

## 2021-04-26 ENCOUNTER — Other Ambulatory Visit: Payer: Self-pay | Admitting: Nurse Practitioner

## 2021-04-26 DIAGNOSIS — I25118 Atherosclerotic heart disease of native coronary artery with other forms of angina pectoris: Secondary | ICD-10-CM

## 2021-04-26 LAB — CBC
Hematocrit: 43.1 % (ref 37.5–51.0)
Hemoglobin: 14.5 g/dL (ref 13.0–17.7)
MCH: 29.2 pg (ref 26.6–33.0)
MCHC: 33.6 g/dL (ref 31.5–35.7)
MCV: 87 fL (ref 79–97)
Platelets: 137 10*3/uL — ABNORMAL LOW (ref 150–450)
RBC: 4.96 x10E6/uL (ref 4.14–5.80)
RDW: 12.8 % (ref 11.6–15.4)
WBC: 5.3 10*3/uL (ref 3.4–10.8)

## 2021-04-26 LAB — BASIC METABOLIC PANEL
BUN/Creatinine Ratio: 16 (ref 10–24)
BUN: 22 mg/dL (ref 8–27)
CO2: 24 mmol/L (ref 20–29)
Calcium: 9.8 mg/dL (ref 8.6–10.2)
Chloride: 104 mmol/L (ref 96–106)
Creatinine, Ser: 1.35 mg/dL — ABNORMAL HIGH (ref 0.76–1.27)
Glucose: 91 mg/dL (ref 70–99)
Potassium: 5.1 mmol/L (ref 3.5–5.2)
Sodium: 142 mmol/L (ref 134–144)
eGFR: 56 mL/min/{1.73_m2} — ABNORMAL LOW (ref >59)

## 2021-05-01 ENCOUNTER — Ambulatory Visit (HOSPITAL_COMMUNITY)
Admission: RE | Admit: 2021-05-01 | Discharge: 2021-05-01 | Disposition: A | Discharge status: Home / Self Care | Payer: Medicare Other | Attending: Cardiology | Admitting: Cardiology

## 2021-05-01 ENCOUNTER — Other Ambulatory Visit: Payer: Self-pay

## 2021-05-01 ENCOUNTER — Other Ambulatory Visit: Payer: Medicare Other

## 2021-05-01 ENCOUNTER — Encounter (HOSPITAL_COMMUNITY)
Admission: RE | Disposition: A | Discharge status: Home / Self Care | Payer: Self-pay | Source: Home / Self Care | Attending: Cardiology

## 2021-05-01 DIAGNOSIS — I25119 Atherosclerotic heart disease of native coronary artery with unspecified angina pectoris: Secondary | ICD-10-CM | POA: Diagnosis not present

## 2021-05-01 DIAGNOSIS — I2510 Atherosclerotic heart disease of native coronary artery without angina pectoris: Secondary | ICD-10-CM | POA: Diagnosis present

## 2021-05-01 DIAGNOSIS — G2581 Restless legs syndrome: Secondary | ICD-10-CM | POA: Diagnosis not present

## 2021-05-01 DIAGNOSIS — E1165 Type 2 diabetes mellitus with hyperglycemia: Secondary | ICD-10-CM | POA: Diagnosis not present

## 2021-05-01 DIAGNOSIS — Z794 Long term (current) use of insulin: Secondary | ICD-10-CM | POA: Insufficient documentation

## 2021-05-01 DIAGNOSIS — E785 Hyperlipidemia, unspecified: Secondary | ICD-10-CM | POA: Insufficient documentation

## 2021-05-01 DIAGNOSIS — I25118 Atherosclerotic heart disease of native coronary artery with other forms of angina pectoris: Secondary | ICD-10-CM | POA: Diagnosis present

## 2021-05-01 DIAGNOSIS — Z955 Presence of coronary angioplasty implant and graft: Secondary | ICD-10-CM

## 2021-05-01 DIAGNOSIS — K219 Gastro-esophageal reflux disease without esophagitis: Secondary | ICD-10-CM | POA: Diagnosis not present

## 2021-05-01 DIAGNOSIS — I10 Essential (primary) hypertension: Secondary | ICD-10-CM | POA: Insufficient documentation

## 2021-05-01 DIAGNOSIS — Z8616 Personal history of COVID-19: Secondary | ICD-10-CM | POA: Insufficient documentation

## 2021-05-01 HISTORY — PX: LEFT HEART CATH AND CORONARY ANGIOGRAPHY: CATH118249

## 2021-05-01 HISTORY — PX: CORONARY STENT INTERVENTION: CATH118234

## 2021-05-01 LAB — GLUCOSE, CAPILLARY
Glucose-Capillary: 225 mg/dL — ABNORMAL HIGH (ref 70–99)
Glucose-Capillary: 215 mg/dL — ABNORMAL HIGH (ref 70–99)

## 2021-05-01 LAB — POCT ACTIVATED CLOTTING TIME: Activated Clotting Time: 335 seconds

## 2021-05-01 SURGERY — LEFT HEART CATH AND CORONARY ANGIOGRAPHY
Anesthesia: LOCAL

## 2021-05-01 MED ORDER — ASPIRIN 81 MG PO CHEW: 81.0000 mg | CHEWABLE_TABLET | ORAL | Status: DC

## 2021-05-01 MED ORDER — AMLODIPINE BESYLATE 5 MG PO TABS
ORAL_TABLET | ORAL | Status: AC
  Filled 2021-05-01: qty 2

## 2021-05-01 MED ORDER — HEPARIN SODIUM (PORCINE) 1000 UNIT/ML IJ SOLN
INTRAMUSCULAR | Status: AC
  Filled 2021-05-01: qty 10

## 2021-05-01 MED ORDER — AMLODIPINE BESYLATE 10 MG PO TABS
10.0000 mg | ORAL_TABLET | Freq: Once | ORAL | Status: AC
Start: 2021-05-01 — End: 2021-05-01
  Administered 2021-05-01: 10 mg via ORAL
  Filled 2021-05-01: qty 1

## 2021-05-01 MED ORDER — SODIUM CHLORIDE 0.9 % WEIGHT BASED INFUSION: 1.0000 mL/kg/h | INTRAVENOUS | Status: DC

## 2021-05-01 MED ORDER — NITROGLYCERIN 1 MG/10 ML FOR IR/CATH LAB
INTRA_ARTERIAL | Status: AC
  Filled 2021-05-01: qty 10

## 2021-05-01 MED ORDER — CARVEDILOL 6.25 MG PO TABS
6.2500 mg | ORAL_TABLET | Freq: Once | ORAL | Status: AC
  Administered 2021-05-01: 6.25 mg via ORAL
  Filled 2021-05-01: qty 1

## 2021-05-01 MED ORDER — FENTANYL CITRATE (PF) 100 MCG/2ML IJ SOLN
INTRAMUSCULAR | Status: DC | PRN
Start: 2021-05-01 — End: 2021-05-01
  Administered 2021-05-01: 50 ug via INTRAVENOUS

## 2021-05-01 MED ORDER — FENTANYL CITRATE (PF) 100 MCG/2ML IJ SOLN
INTRAMUSCULAR | Status: AC
Start: 2021-05-01 — End: ?
  Filled 2021-05-01: qty 2

## 2021-05-01 MED ORDER — SODIUM CHLORIDE 0.9 % IV SOLN: 250.0000 mL | INTRAVENOUS | Status: DC | PRN

## 2021-05-01 MED ORDER — HEPARIN (PORCINE) IN NACL 1000-0.9 UT/500ML-% IV SOLN
INTRAVENOUS | Status: AC
  Filled 2021-05-01: qty 1000

## 2021-05-01 MED ORDER — ASPIRIN 81 MG PO CHEW
81.0000 mg | CHEWABLE_TABLET | ORAL | Status: AC
  Administered 2021-05-01: 81 mg via ORAL
  Filled 2021-05-01: qty 1

## 2021-05-01 MED ORDER — HYDRALAZINE HCL 20 MG/ML IJ SOLN
10.0000 mg | Freq: Once | INTRAMUSCULAR | Status: AC
  Administered 2021-05-01: 10 mg via INTRAVENOUS

## 2021-05-01 MED ORDER — HYDRALAZINE HCL 20 MG/ML IJ SOLN
5.0000 mg | INTRAMUSCULAR | Status: AC | PRN
  Administered 2021-05-01 (×2): 5 mg via INTRAVENOUS

## 2021-05-01 MED ORDER — HYDRALAZINE HCL 20 MG/ML IJ SOLN
INTRAMUSCULAR | Status: AC
  Filled 2021-05-01: qty 1

## 2021-05-01 MED ORDER — SODIUM CHLORIDE 0.9% FLUSH: 3.0000 mL | INTRAVENOUS | Status: DC | PRN

## 2021-05-01 MED ORDER — HEPARIN (PORCINE) IN NACL 1000-0.9 UT/500ML-% IV SOLN
INTRAVENOUS | Status: DC | PRN
  Administered 2021-05-01 (×2): 500 mL

## 2021-05-01 MED ORDER — PRAMIPEXOLE DIHYDROCHLORIDE 1 MG PO TABS
1.0000 mg | ORAL_TABLET | Freq: Once | ORAL | Status: AC
  Administered 2021-05-01: 1 mg via ORAL
  Filled 2021-05-01: qty 1

## 2021-05-01 MED ORDER — CLOPIDOGREL BISULFATE 75 MG PO TABS
75.0000 mg | ORAL_TABLET | Freq: Once | ORAL | Status: AC
  Administered 2021-05-01: 75 mg via ORAL
  Filled 2021-05-01: qty 1

## 2021-05-01 MED ORDER — SODIUM CHLORIDE 0.9% FLUSH: 3.0000 mL | Freq: Two times a day (BID) | INTRAVENOUS | Status: DC

## 2021-05-01 MED ORDER — MIDAZOLAM HCL 2 MG/2ML IJ SOLN
INTRAMUSCULAR | Status: DC | PRN
  Administered 2021-05-01 (×2): 1 mg via INTRAVENOUS

## 2021-05-01 MED ORDER — SODIUM CHLORIDE 0.9 % WEIGHT BASED INFUSION: 3.0000 mL/kg/h | INTRAVENOUS | Status: DC

## 2021-05-01 MED ORDER — ONDANSETRON HCL 4 MG/2ML IJ SOLN: 4.0000 mg | Freq: Four times a day (QID) | INTRAMUSCULAR | Status: DC | PRN

## 2021-05-01 MED ORDER — LIDOCAINE HCL (PF) 1 % IJ SOLN
INTRAMUSCULAR | Status: AC
  Filled 2021-05-01: qty 30

## 2021-05-01 MED ORDER — SODIUM CHLORIDE 0.9 % WEIGHT BASED INFUSION
3.0000 mL/kg/h | INTRAVENOUS | Status: AC
  Administered 2021-05-01: 3 mL/kg/h via INTRAVENOUS

## 2021-05-01 MED ORDER — VERAPAMIL HCL 2.5 MG/ML IV SOLN
INTRAVENOUS | Status: AC
  Filled 2021-05-01: qty 2

## 2021-05-01 MED ORDER — NITROGLYCERIN 1 MG/10 ML FOR IR/CATH LAB
INTRA_ARTERIAL | Status: DC | PRN
  Administered 2021-05-01 (×2): 200 ug via INTRACORONARY

## 2021-05-01 MED ORDER — VERAPAMIL HCL 2.5 MG/ML IV SOLN
INTRAVENOUS | Status: DC | PRN
  Administered 2021-05-01: 5 mL via INTRA_ARTERIAL

## 2021-05-01 MED ORDER — MIDAZOLAM HCL 2 MG/2ML IJ SOLN
INTRAMUSCULAR | Status: AC
  Filled 2021-05-01: qty 2

## 2021-05-01 MED ORDER — ACETAMINOPHEN 325 MG PO TABS: 650.0000 mg | ORAL_TABLET | ORAL | Status: DC | PRN

## 2021-05-01 MED ORDER — LIDOCAINE HCL (PF) 1 % IJ SOLN
INTRAMUSCULAR | Status: DC | PRN
  Administered 2021-05-01: 2 mL via INTRADERMAL

## 2021-05-01 MED ORDER — HEPARIN SODIUM (PORCINE) 1000 UNIT/ML IJ SOLN
INTRAMUSCULAR | Status: DC | PRN
  Administered 2021-05-01: 5000 [IU] via INTRAVENOUS
  Administered 2021-05-01: 4000 [IU] via INTRAVENOUS

## 2021-05-01 SURGICAL SUPPLY — 17 items
BALLN SAPPHIRE 2.5X30 (BALLOONS) ×2
BALLOON SAPPHIRE 2.5X30 (BALLOONS) IMPLANT
CATH LAUNCHER 6FR JR4 (CATHETERS) ×1 IMPLANT
CATH OPTITORQUE TIG 4.0 5F (CATHETERS) ×1 IMPLANT
DEVICE RAD COMP TR BAND LRG (VASCULAR PRODUCTS) ×1 IMPLANT
GLIDESHEATH SLEND A-KIT 6F 22G (SHEATH) ×1 IMPLANT
GUIDEWIRE INQWIRE 1.5J.035X260 (WIRE) IMPLANT
INQWIRE 1.5J .035X260CM (WIRE) ×2
KIT ENCORE 26 ADVANTAGE (KITS) ×1 IMPLANT
KIT HEART LEFT (KITS) ×2 IMPLANT
PACK CARDIAC CATHETERIZATION (CUSTOM PROCEDURE TRAY) ×2 IMPLANT
SHEATH PROBE COVER 6X72 (BAG) ×1 IMPLANT
STENT ONYX FRONTIER 2.5X18 (Permanent Stent) ×1 IMPLANT
STENT ONYX FRONTIER 2.5X38 (Permanent Stent) ×1 IMPLANT
TRANSDUCER W/STOPCOCK (MISCELLANEOUS) ×2 IMPLANT
TUBING CIL FLEX 10 FLL-RA (TUBING) ×2 IMPLANT
WIRE ASAHI PROWATER 180CM (WIRE) ×1 IMPLANT

## 2021-05-01 NOTE — Interval H&P Note (Signed)
History and Physical Interval Note:  05/01/2021 8:59 AM  Kenneth Sic Sr.  has presented today for surgery, with the diagnosis of chest pain.  The various methods of treatment have been discussed with the patient and family. After consideration of risks, benefits and other options for treatment, the patient has consented to  Procedure(s): LEFT HEART CATH AND CORONARY ANGIOGRAPHY (N/A) and possible angioplasty as a surgical intervention.  The patient's history has been reviewed, patient examined, no change in status, stable for surgery.  I have reviewed the patient's chart and labs.  Questions were answered to the patient's satisfaction.     [name]

## 2021-05-01 NOTE — Progress Notes (Addendum)
Dr. [name] in. Ordered to d/c ice pack and resume BP cuff on for 30 minutes at 110 mmHg. BP cuff applied to rt forearm. Aware of high BP. Patient trying to void.

## 2021-05-01 NOTE — Progress Notes (Signed)
Stood at bedside to void. Dr. [name] in to see. Rt forearm stable; level 2, 1+ palpable rt radial; good pleth waveform; BP cuff removed at 1130; ordered to keep it off. Orders written for BP

## 2021-05-01 NOTE — Discharge Instructions (Signed)
NO METFORMIN FOR 2 DAYS 

## 2021-05-01 NOTE — Progress Notes (Signed)
Manual BP cuff removed from rt forearm per order. Firm hematoma approx 7cm long on underside of forearm. [place] around TR band. Closely  monitoring

## 2021-05-01 NOTE — Progress Notes (Signed)
CARDIAC REHAB PHASE I   Stent education completed with pt and wife. Pt educated on importance of ASA and Plavix. Pt given stent card along with heart healthy and diabetic diets. Reviewed site care, restrictions, and exercise guidelines. Will refer to CRP II [place].  [id number] Rufina Falco, RN BSN 05/01/2021 3:16 PM

## 2021-05-01 NOTE — Progress Notes (Signed)
Dr. Abby Potash in to look at rt forearm. Ordered to alternate applying BP cuff to rt forearm at for 15 minutes with ice pack for 15 minutes. BP cuff applied at 1040

## 2021-05-02 ENCOUNTER — Encounter (HOSPITAL_COMMUNITY): Payer: Self-pay | Admitting: Cardiology

## 2021-05-04 ENCOUNTER — Ambulatory Visit: Payer: Medicare Other | Admitting: Nurse Practitioner

## 2021-05-04 DIAGNOSIS — Z955 Presence of coronary angioplasty implant and graft: Secondary | ICD-10-CM | POA: Diagnosis not present

## 2021-05-04 DIAGNOSIS — K8020 Calculus of gallbladder without cholecystitis without obstruction: Secondary | ICD-10-CM | POA: Diagnosis not present

## 2021-05-04 DIAGNOSIS — E1151 Type 2 diabetes mellitus with diabetic peripheral angiopathy without gangrene: Secondary | ICD-10-CM | POA: Diagnosis not present

## 2021-05-07 ENCOUNTER — Ambulatory Visit (INDEPENDENT_AMBULATORY_CARE_PROVIDER_SITE_OTHER): Payer: Medicare Other | Admitting: Endocrinology

## 2021-05-07 ENCOUNTER — Other Ambulatory Visit: Payer: Self-pay

## 2021-05-07 ENCOUNTER — Other Ambulatory Visit: Payer: Self-pay | Admitting: Endocrinology

## 2021-05-07 ENCOUNTER — Encounter: Payer: Self-pay | Admitting: Endocrinology

## 2021-05-07 VITALS — BP 122/78 | HR 67 | Ht 68.0 in | Wt 204.4 lb

## 2021-05-07 DIAGNOSIS — I209 Angina pectoris, unspecified: Secondary | ICD-10-CM

## 2021-05-07 DIAGNOSIS — E1151 Type 2 diabetes mellitus with diabetic peripheral angiopathy without gangrene: Secondary | ICD-10-CM | POA: Diagnosis not present

## 2021-05-07 LAB — POCT GLYCOSYLATED HEMOGLOBIN (HGB A1C): Hemoglobin A1C: 8.3 % — AB (ref 4.0–5.6)

## 2021-05-07 MED ORDER — NOVOLOG FLEXPEN 100 UNIT/ML ~~LOC~~ SOPN: PEN_INJECTOR | SUBCUTANEOUS | 11 refills | Status: DC

## 2021-05-07 NOTE — Progress Notes (Signed)
Subjective:    Patient ID: Kenneth Vranich Sr., male    DOB: October 04, 1950, 71 y.o.   MRN: [id number]  HPI Pt returns for f/u of diabetes mellitus:  DM type: Insulin-requiring type 2 Dx'ed: [id number] Complications: stage 3 CRI and CAD.   Therapy: insulin since 2016.  DKA: never Severe hypoglycemia: 2019 Pancreatitis: never Pancreatic imaging: normal on 2020 CT SDOH: Wife provides hx, due to pt's memory loss; he takes multiple daily injections.  Other: He did not tolerate trulicity (nausea) Interval history: He has nausea, which he attributes to metformin.  He has mild hypoglycemia.  This happens at approx 2AM.  I reviewed continuous glucose monitor data.  Glucose varies from 85-400.  It is in general highest at [place], and lowest at [place].  It slowly increases overnight and throughout the morning Past Medical History:  Diagnosis Date   Arthritis    "left thumb" (05/23/2017) right shoulder   Barrett's esophagus    "we've been told that it's all gone; still take RX for GERD" (05/23/2017)   Bilateral swelling of feet and ankles x 3 weeks as of 12-01-2019   CAD (coronary artery disease), native coronary artery    Coronary angiogram 05/03/2014:  Proximal LAD 3.0x12 mm Promus premier stent.  04/08/2014: Mid Cx 3.5 x 16 mm Promus DES.  03/29/2013: Mid LAD 2.75 x 38 mm Promus Premier drug-eluting stent, balloon angioplasty of D1 and distal LAD and stenting of distal RCA with 2.75 x 24 mm promos Premier drug-eluting stent 12/15/2012 widely patent.   Cervicalgia    Chronic bronchitis ([place])    "q yr" (Q000111Q)   Complication of anesthesia    " I do not wake up very well "  slow to wake up   COVID-19 05/2019   all covid symptoms in hospital x 5 days, all symptoms resolved took monoclonal antibody tx    Dyspnea    with exertion   Family history of adverse reaction to anesthesia    "son w/PONV"   GERD (gastroesophageal reflux disease)    Heart murmur    "grew out of it"    Hyperlipidemia    Hypertension     Hypoglycemia, unspecified    IDDM (insulin dependent diabetes mellitus)    type 2   Impotence of organic origin    Iron deficiency anemia    Memory loss    mild   Other malaise and fatigue    Pneumonia 05/2019   PONV (postoperative nausea and vomiting)    after wisdom teeth pulled, no problems with other surgeries   Restless leg    Rotator cuff arthropathy    Stroke ([place])    Tension headache    "sometimes" (05/23/2017)   Unspecified gastritis and gastroduodenitis with hemorrhage    Unstable angina pectoris ([place]) 04/08/2014   Coronary angiogram 05/03/2014:  Proximal LAD 3.0x12 mm Promus premier stent.  04/08/2014: Mid Cx 3.5 x 16 mm Promus DES.  03/29/2013: Mid LAD 2.75 x 38 mm Promus Premier drug-eluting stent, balloon angioplasty of D1 and distal LAD and stenting of distal RCA with 2.75 x 24 mm promos Premier drug-eluting stent 12/15/2012 widely patent.    Past Surgical History:  Procedure Laterality Date   BICEPT TENODESIS Right 12/07/2019   Procedure: RIGHT BICEPS TENODESIS;  Surgeon: Renette Butters, MD;  Location: [facility];  Service: Orthopedics;  Laterality: Right;   CARDIAC CATHETERIZATION N/A 09/25/2015   Procedure: Left Heart Cath and Coronary Angiography;  Surgeon: Adrian Prows, MD;  Location:  [facility] INVASIVE CV LAB;  Service: Cardiovascular;  Laterality: N/A;   CARDIAC CATHETERIZATION N/A 09/25/2015   Procedure: Intravascular Pressure Wire/FFR Study;  Surgeon: [name], MD;  Location: [facility] INVASIVE CV LAB;  Service: Cardiovascular;  Laterality: N/A;   CORONARY ANGIOPLASTY WITH STENT PLACEMENT  12/15/2012; 04/28/2014; 05/03/2014   "2; 1; 1" , 4 stents 2 balloons   CORONARY STENT INTERVENTION N/A 05/01/2021   Procedure: CORONARY STENT INTERVENTION;  Surgeon: [name], MD;  Location: [facility] INVASIVE CV LAB;  Service: Cardiovascular;  Laterality: N/A;   FRACTIONAL FLOW RESERVE WIRE Right 03/26/2013   Procedure: FRACTIONAL FLOW RESERVE WIRE;  Surgeon: Pamella Pert, MD;   Location: [facility] CATH LAB;  Service: Cardiovascular;  Laterality: Right;   FRACTIONAL FLOW RESERVE WIRE N/A 05/03/2014   Procedure: FRACTIONAL FLOW RESERVE WIRE;  Surgeon: Pamella Pert, MD;  Location: [facility] CATH LAB;  Service: Cardiovascular;  Laterality: N/A;   HERNIA REPAIR  2010   "umbilical"    INCISION AND DRAINAGE ABSCESS Left 05/2007   "groin"    KNEE SURGERY Right 1992;  2000   "calcium deposits removed" (12/15/2012)   LEFT HEART CATH AND CORONARY ANGIOGRAPHY N/A 05/23/2017   Procedure: LEFT HEART CATH AND CORONARY ANGIOGRAPHY;  Surgeon: [name], MD;  Location: [facility] INVASIVE CV LAB;  Service: Cardiovascular;  Laterality: N/A;   LEFT HEART CATH AND CORONARY ANGIOGRAPHY N/A 05/01/2021   Procedure: LEFT HEART CATH AND CORONARY ANGIOGRAPHY;  Surgeon: [name], MD;  Location: [facility] INVASIVE CV LAB;  Service: Cardiovascular;  Laterality: N/A;   LEFT HEART CATHETERIZATION WITH CORONARY ANGIOGRAM N/A 12/15/2012   Procedure: LEFT HEART CATHETERIZATION WITH CORONARY ANGIOGRAM;  Surgeon: Pamella Pert, MD;  Location: [facility] CATH LAB;  Service: Cardiovascular;  Laterality: N/A;   LEFT HEART CATHETERIZATION WITH CORONARY ANGIOGRAM N/A 03/26/2013   Procedure: LEFT HEART CATHETERIZATION WITH CORONARY ANGIOGRAM;  Surgeon: Pamella Pert, MD;  Location: [facility] CATH LAB;  Service: Cardiovascular;  Laterality: N/A;   LEFT HEART CATHETERIZATION WITH CORONARY ANGIOGRAM N/A 04/08/2014   Procedure: LEFT HEART CATHETERIZATION WITH CORONARY ANGIOGRAM;  Surgeon: Micheline Chapman, MD;  Location: [facility] CATH LAB;  Service: Cardiovascular;  Laterality: N/A;   PERCUTANEOUS CORONARY STENT INTERVENTION (PCI-S) N/A 05/03/2014   Procedure: PERCUTANEOUS CORONARY STENT INTERVENTION (PCI-S);  Surgeon: Pamella Pert, MD;  Location: [facility] CATH LAB;  Service: Cardiovascular;  Laterality: N/A;   RADIOLOGY WITH ANESTHESIA N/A 02/08/2021   Procedure: MRI WITH ANESTHESIA OF ABDOMEN WITH AND WITHOUT CONSTRAST;  Surgeon: Radiologist, Medication,  MD;  Location: [facility] OR;  Service: Radiology;  Laterality: N/A;   ROTATOR CUFF REPAIR     UMBILICAL HERNIA REPAIR  yrs ago    Social History   Socioeconomic History   Marital status: Married    Spouse name: Pam   Number of children: 2   Years of education: Not on file   Highest education level: Not on file  Occupational History   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   Occupation: woodworking  Tobacco Use   Smoking status: Never   Smokeless tobacco: Never  Building services engineer Use: Never used  Substance and Sexual Activity   Alcohol use: No   Drug use: No   Sexual activity: Not Currently  Other Topics Concern   Not on file  Social History Narrative   Not on file   Social Determinants of Health   Financial Resource Strain: Low Risk    Difficulty of Paying Living Expenses: Not  very hard  Food Insecurity: No Food Insecurity   Worried About Charity fundraiser in the Last Year: Never true   Ran Out of Food in the Last Year: Never true  Transportation Needs: No Transportation Needs   Lack of Transportation (Medical): No   Lack of Transportation (Non-Medical): No  Physical Activity: Not on file  Stress: Not on file  Social Connections: Not on file  Intimate Partner Violence: Not on file    Current Outpatient Medications on File Prior to Visit  Medication Sig Dispense Refill   acetaminophen (TYLENOL) 500 MG tablet Take 500 mg by mouth every 6 (six) hours as needed for mild pain, moderate pain, headache or fever.     albuterol (PROVENTIL HFA;VENTOLIN HFA) 108 (90 Base) MCG/ACT inhaler Inhale 2 puffs into the lungs every 6 (six) hours as needed for wheezing or shortness of breath. 18 g 1   amLODipine (NORVASC) 5 MG tablet Take 1 tablet (5 mg total) by mouth daily. Can take an additional 5 mg of amlodipine for blood pressure Q000111Q mmHg stystolic (Patient taking differently: Take 5 mg by mouth every evening. Can take an additional 5 mg of amlodipine for blood  pressure Q000111Q mmHg stystolic) 30 tablet 3   aspirin EC 81 MG tablet Take 1 tablet (81 mg total) by mouth daily. Swallow whole. (Patient taking differently: Take 81 mg by mouth every evening. Swallow whole.) 90 tablet 3   BD PEN NEEDLE NANO 2ND GEN 32G X 4 MM MISC USE AS DIRECTED 100 each 3   Cholecalciferol (D-5000) 125 MCG (5000 UT) TABS Take 1 tablet (5,000 Units total) by mouth daily. E55.9 (Patient taking differently: Take 5,000 Units by mouth every evening. E55.9) 90 tablet 3   clopidogrel (PLAVIX) 75 MG tablet Take 1 tablet (75 mg total) by mouth daily. (Patient taking differently: Take 75 mg by mouth every evening.) 90 tablet 3   Continuous Blood Gluc Receiver (FREESTYLE LIBRE 14 DAY READER) DEVI Inject 1 Device as directed daily as needed. Use once daily as direct to check blood sugar E11.51 1 each 0   Continuous Blood Gluc Sensor (FREESTYLE LIBRE 14 DAY SENSOR) MISC Use to test blood sugar three times daily. E11.51. E11.59 2 each 12   fluticasone (FLONASE) 50 MCG/ACT nasal spray Place 1 spray into both nostrils daily as needed for allergies or rhinitis.     gabapentin (NEURONTIN) 100 MG capsule TAKE 2 CAPSULES BY MOUTH AT BEDTIME. 180 capsule 1   glucose 4 GM chewable tablet Chew 1 tablet (4 g total) by mouth as needed for low blood sugar. 50 tablet 12   Insulin Glargine (BASAGLAR KWIKPEN) 100 UNIT/ML Inject 40 units subcutaneously into the skin at bedtime 45 mL 4   isosorbide mononitrate (IMDUR) 60 MG 24 hr tablet Take 1 tablet (60 mg total) by mouth daily. 30 tablet 3   LIFESCAN FINEPOINT LANCETS MISC Use to test for blood sugar three times daily dx: E11.51 300 each 1   metoprolol succinate (TOPROL-XL) 25 MG 24 hr tablet TAKE 1 TABLET BY MOUTH DAILY. TAKE WITH OR IMMEDIATELY FOLLOWING A MEAL. 90 tablet 1   nitroGLYCERIN (NITROSTAT) 0.4 MG SL tablet PLACE 1 TABLET UNDER THE TONGUE EVERY 5 MINUTES AS NEEDED FOR CHEST PAIN. 50 tablet 0   ONETOUCH ULTRA test strip USE TO TEST FOR BLOOD SUGAR  THREE TIMES DAILY DX: E11.51 300 strip 11   pantoprazole (PROTONIX) 40 MG tablet TAKE ONE TABLET BY MOUTH ONCE DAILY FOR STOMACH 90 tablet  1   PRALUENT 150 MG/ML SOAJ INJECT 1 PEN INTO THE SKIN EVERY 14 (FOURTEEN) DAYS. 2 mL 3   pramipexole (MIRAPEX) 1 MG tablet TAKE 1 TABLET BY MOUTH AT 5PM, 1 TABLET BY MOUTH AT 7PM, AND TAKE 2 TABLETS BY MOUTH AT BEDTIME. 360 tablet 1   valsartan (DIOVAN) 160 MG tablet Take 1 tablet (160 mg total) by mouth every evening. 90 tablet 3   Current Facility-Administered Medications on File Prior to Visit  Medication Dose Route Frequency Provider Last Rate Last Admin   [COMPLETED] hydrALAZINE (APRESOLINE) injection 10 mg  10 mg Intravenous Once Adrian Prows, MD   10 mg at 05/01/21 1054    Allergies  Allergen Reactions   Lyrica [Pregabalin] Other (See Comments)    Made patient very lethargic the next morning, very hard to patient to function, move, etc.    Statins Other (See Comments)    Causes Restless Legs, rhabdomyolysis   Ativan [Lorazepam] Other (See Comments)    Markedly increased restless legs   Trulicity [Dulaglutide] Other (See Comments)    GI side effects     Family History  Adopted: Yes  Problem Relation Age of Onset   Emphysema Mother     BP 122/78 (BP Location: Left Arm, Patient Position: Sitting, Cuff Size: Normal)    Pulse 67    Ht 5\' 8"  (1.727 m)    Wt 204 lb 6.4 oz (92.7 kg)    SpO2 97%    BMI 31.08 kg/m    Review of Systems     Objective:   Physical Exam  Lab Results  Component Value Date   CREATININE 1.35 (H) 04/25/2021   BUN 22 04/25/2021   NA 142 04/25/2021   K 5.1 04/25/2021   CL 104 04/25/2021   CO2 24 04/25/2021    Lab Results  Component Value Date   HGBA1C 8.3 (A) 05/07/2021      Assessment & Plan:  Insulin-requiring type 2 DM: uncontrolled  Patient Instructions  check your blood sugar twice a day.  vary the time of day when you check, between before the 3 meals, and at bedtime.  also check if you have  symptoms of your blood sugar being too high or too low.  please keep a record of the readings and bring it to your next appointment here (or you can bring the meter itself).  You can write it on any piece of paper.  please call us sooner if your blood sugar goes below 70, or if most of your readings are over 200.  Please stop taking the metformin, and Change the Novolog to 3 times a day (just before each meal) 30-25-15 units, and: Please continue the same Basaglar Please come back for a follow-up appointment in 1 month.

## 2021-05-07 NOTE — Patient Instructions (Addendum)
check your blood sugar twice a day.  vary the time of day when you check, between before the 3 meals, and at bedtime.  also check if you have symptoms of your blood sugar being too high or too low.  please keep a record of the readings and bring it to your next appointment here (or you can bring the meter itself).  You can write it on any piece of paper.  please call us sooner if your blood sugar goes below 70, or if most of your readings are over 200.  ?Please stop taking the metformin, and ?Change the Novolog to 3 times a day (just before each meal) 30-25-15 units, and: ?Please continue the same Basaglar ?Please come back for a follow-up appointment in 1 month.  ?

## 2021-05-08 ENCOUNTER — Telehealth (HOSPITAL_COMMUNITY): Payer: Self-pay

## 2021-05-08 ENCOUNTER — Encounter: Payer: Self-pay | Admitting: Endocrinology

## 2021-05-08 NOTE — Telephone Encounter (Signed)
Called patient to see if he is interested in the Cardiac Rehab Program. Patient expressed interest. Explained scheduling process and went over insurance, patient verbalized understanding. Will contact patient for scheduling once f/u has been completed.  °

## 2021-05-08 NOTE — Telephone Encounter (Signed)
Pt insurance is active and benefits verified through Medicare a/b Co-pay 0, DED $226/$226 met, out of pocket 0/0 met, co-insurance 20%. no pre-authorization required. Passport, 05/08/2021@10 :11am, REF# [id number] ? ?2ndary insurance is active and benefits verified through [employer]. Co-pay 0, DED 0/0 met, out of pocket 0/0 met, co-insurance 0%. No pre-authorization required.  ? ?Will contact patient to see if he is interested in the Cardiac Rehab Program. If interested, patient will need to complete follow up appt. Once completed, patient will be contacted for scheduling upon review by the RN Navigator. ?

## 2021-05-09 ENCOUNTER — Other Ambulatory Visit: Payer: Self-pay | Admitting: Endocrinology

## 2021-05-10 NOTE — Progress Notes (Unsigned)
Primary Physician/Referring:  Lauree Chandler, NP  Patient ID: Kenneth Lav Sr., male    DOB: Nov 22, 1950, 71 y.o.   MRN: [id number]  No chief complaint on file.  HPI:    Kenneth Lunday Sr.  is a 71 y.o. caucasian male with coronary artery disease, last cardiac catheterization on 05/29/2017 when he presented with unstable anginal-like symptoms revealing widely patent stent to his LAD, RCA and circumflex and also balloon angioplasty site to D1 and distal LAD. Fortunately his last PCI has been in 2016.  Past medical history significant for hypertension, hyperlipidemia, GERD, Barrett's esophagus, statin intolerance, uncontrolled diabetes mellitus and also severe restless leg syndrome.  Patient underwent left heart catheterization 04/23/2021 with successful PCI to RCA and patient recommended for uninterrupted dual antiplatelet therapy for minimum of 6 months.  He did develop a small hematoma in his right forearm, however he was subsequently discharged and now presents for follow-up. ***  ***  Patient was last seen in our office 04/11/2021 with complaints of chest pain, therefore added Imdur 60 mg daily given uncontrolled hypertension and stable angina.  He now presents for follow-up.  Patient continues to have chest pain even with minimal exertion.  He is experiencing anginal symptoms nearly daily and is taking nitroglycerin several times per week.  Blood pressure remains elevated, particularly when patient is experiencing chest pain.  Past Medical History:  Diagnosis Date   Arthritis    "left thumb" (05/23/2017) right shoulder   Barrett's esophagus    "we've been told that it's all gone; still take RX for GERD" (05/23/2017)   Bilateral swelling of feet and ankles x 3 weeks as of 12-01-2019   CAD (coronary artery disease), native coronary artery    Coronary angiogram 05/03/2014:  Proximal LAD 3.0x12 mm Promus premier stent.  04/08/2014: Mid Cx 3.5 x 16 mm Promus DES.  03/29/2013: Mid LAD 2.75 x 38 mm Promus  Premier drug-eluting stent, balloon angioplasty of D1 and distal LAD and stenting of distal RCA with 2.75 x 24 mm promos Premier drug-eluting stent 12/15/2012 widely patent.   Cervicalgia    Chronic bronchitis ([place])    "q yr" (Q000111Q)   Complication of anesthesia    " I do not wake up very well "  slow to wake up   COVID-19 05/2019   all covid symptoms in hospital x 5 days, all symptoms resolved took monoclonal antibody tx    Dyspnea    with exertion   Family history of adverse reaction to anesthesia    "son w/PONV"   GERD (gastroesophageal reflux disease)    Heart murmur    "grew out of it"    Hyperlipidemia    Hypertension    Hypoglycemia, unspecified    IDDM (insulin dependent diabetes mellitus)    type 2   Impotence of organic origin    Iron deficiency anemia    Memory loss    mild   Other malaise and fatigue    Pneumonia 05/2019   PONV (postoperative nausea and vomiting)    after wisdom teeth pulled, no problems with other surgeries   Restless leg    Rotator cuff arthropathy    Stroke ([place])    Tension headache    "sometimes" (05/23/2017)   Unspecified gastritis and gastroduodenitis with hemorrhage    Unstable angina pectoris ([place]) 04/08/2014   Coronary angiogram 05/03/2014:  Proximal LAD 3.0x12 mm Promus premier stent.  04/08/2014: Mid Cx 3.5 x 16 mm Promus DES.  03/29/2013: Mid LAD 2.75  x 38 mm Promus Premier drug-eluting stent, balloon angioplasty of D1 and distal LAD and stenting of distal RCA with 2.75 x 24 mm promos Premier drug-eluting stent 12/15/2012 widely patent.   Past Surgical History:  Procedure Laterality Date   BICEPT TENODESIS Right 12/07/2019   Procedure: RIGHT BICEPS TENODESIS;  Surgeon: Renette Butters, MD;  Location: [facility];  Service: Orthopedics;  Laterality: Right;   CARDIAC CATHETERIZATION N/A 09/25/2015   Procedure: Left Heart Cath and Coronary Angiography;  Surgeon: Adrian Prows, MD;  Location: [place] [facility];  Service:  Cardiovascular;  Laterality: N/A;   CARDIAC CATHETERIZATION N/A 09/25/2015   Procedure: Intravascular Pressure Wire/FFR Study;  Surgeon: Adrian Prows, MD;  Location: [place] [facility];  Service: Cardiovascular;  Laterality: N/A;   CORONARY ANGIOPLASTY WITH STENT PLACEMENT  12/15/2012; 04/28/2014; 05/03/2014   "2; 1; 1" , 4 stents 2 balloons   CORONARY STENT INTERVENTION N/A 05/01/2021   Procedure: CORONARY STENT INTERVENTION;  Surgeon: Adrian Prows, MD;  Location: [place] [facility];  Service: Cardiovascular;  Laterality: N/A;   FRACTIONAL FLOW RESERVE WIRE Right 03/26/2013   Procedure: FRACTIONAL FLOW RESERVE WIRE;  Surgeon: Laverda Page, MD;  Location: [facility];  Service: Cardiovascular;  Laterality: Right;   FRACTIONAL FLOW RESERVE WIRE N/A 05/03/2014   Procedure: FRACTIONAL FLOW RESERVE WIRE;  Surgeon: Laverda Page, MD;  Location: [facility] CATH LAB;  Service: Cardiovascular;  Laterality: N/A;   HERNIA REPAIR  [id number]   "umbilical"    INCISION AND DRAINAGE ABSCESS Left 05/2007   "groin"    KNEE SURGERY Right 1992;  2000   "calcium deposits removed" (12/15/2012)   LEFT HEART CATH AND CORONARY ANGIOGRAPHY N/A 05/23/2017   Procedure: LEFT HEART CATH AND CORONARY ANGIOGRAPHY;  Surgeon: Adrian Prows, MD;  Location: [place] [facility];  Service: Cardiovascular;  Laterality: N/A;   LEFT HEART CATH AND CORONARY ANGIOGRAPHY N/A 05/01/2021   Procedure: LEFT HEART CATH AND CORONARY ANGIOGRAPHY;  Surgeon: Adrian Prows, MD;  Location: [place] [facility];  Service: Cardiovascular;  Laterality: N/A;   LEFT HEART CATHETERIZATION WITH CORONARY ANGIOGRAM N/A 12/15/2012   Procedure: LEFT HEART CATHETERIZATION WITH CORONARY ANGIOGRAM;  Surgeon: Laverda Page, MD;  Location: [facility] C[facility] LAB;  Service: Cardiovascular;  Laterality: N/A;   LEFT HEART CATHETERIZATION WITH CORONARY ANGIOGRAM N/A 03/26/2013   Procedure: LEFT HEART CATHETERIZATION WITH CORONARY ANGIOGRAM;  Surgeon: Laverda Page, MD;  Location: [facility] C[facility] LAB;   Service: Cardiovascular;  Laterality: N/A;   LEFT HEART CATHETERIZATION WITH CORONARY ANGIOGRAM N/A 04/08/2014   Procedure: LEFT HEART CATHETERIZATION WITH CORONARY ANGIOGRAM;  Surgeon: Blane Ohara, MD;  Location: [facility] C[facility] LAB;  Service: Cardiovascular;  Laterality: N/A;   PERCUTANEOUS CORONARY STENT INTERVENTION (PCI-S) N/A 05/03/2014   Procedure: PERCUTANEOUS CORONARY STENT INTERVENTION (PCI-S);  Surgeon: Laverda Page, MD;  Location: [facility] CATH LAB;  Service: Cardiovascular;  Laterality: N/A;   RADIOLOGY WITH ANESTHESIA N/A 02/08/2021   Procedure: MRI WITH ANESTHESIA OF ABDOMEN WITH AND WITHOUT CONSTRAST;  Surgeon: Radiologist, Medication, MD;  Location: [place];  Service: Radiology;  Laterality: N/A;   ROTATOR CUFF REPAIR     UMBILICAL HERNIA REPAIR  yrs ago   Family History  Adopted: Yes  Problem Relation Age of Onset   Emphysema Mother    Social History   Tobacco Use   Smoking status: Never   Smokeless tobacco: Never  Substance Use Topics   Alcohol use: No     ROS  Review  of Systems  Cardiovascular:  Positive for chest pain, claudication (stable) and dyspnea on exertion. Negative for leg swelling, near-syncope, orthopnea, palpitations, paroxysmal nocturnal dyspnea and syncope.  Respiratory:  Negative for shortness of breath.   Neurological:  Negative for dizziness.   Objective   Vitals with BMI 05/07/2021 05/01/2021 05/01/2021  Height 5\' 8"  - -  Weight 204 lbs 6 oz - -  BMI 0000000 - -  Systolic [id number] Q000111Q Q000111Q  Diastolic 78 91 76  Pulse 67 82 63    There were no vitals taken for this visit. There is no height or weight on file to calculate BMI.   Physical Exam Vitals reviewed.  Constitutional:      Appearance: He is well-developed.  Neck:     Vascular: No carotid bruit.  Cardiovascular:     Rate and Rhythm: Normal rate and regular rhythm.     Pulses:          Carotid pulses are 2+ on the right side and 2+ on the left side.      Femoral pulses are 2+ on the right side  and 2+ on the left side.      Dorsalis pedis pulses are 0 on the right side and 0 on the left side.       Posterior tibial pulses are 1+ on the right side and 0 on the left side.     Heart sounds: Murmur heard.  Blowing decrescendo early diastolic murmur is present at the upper right sternal border radiating to the apex.    No gallop.     Comments: No JVD. Pulmonary:     Effort: Pulmonary effort is normal.     Breath sounds: Normal breath sounds.  Musculoskeletal:     Right lower leg: Edema (trace) present.     Left lower leg: Edema (trace) present.   Laboratory examination:   Recent Labs    06/30/20 0808 01/03/21 0825 02/09/21 0240 02/10/21 1026 02/11/21 1137 02/21/21 0912 04/25/21 0949  NA 137   < > 132* 132* 133* 136 142  K 5.2   < > 5.1 4.2 4.5 4.9 5.1  CL 102   < > 98 100 100 102 104  CO2 30   < > 26 24 24 27 24   GLUCOSE 152*   < > 327* 215* 260* 263* 91  BUN 25   < > 25* 19 17 20 22   CREATININE 1.25*   < > 1.52* 1.29* 1.02 1.19 1.35*  CALCIUM 9.7   < > 8.4* 9.0 9.1 9.3 9.8  GFRNONAA 58*   < > 49* 59* >60  --   --   GFRAA 67  --   --   --   --   --   --    < > = values in this interval not displayed.    CMP Latest Ref Rng & Units 04/25/2021 02/21/2021 02/11/2021  Glucose 70 - 99 mg/dL 91 263(H) 260(H)  BUN 8 - 27 mg/dL 22 20 17   Creatinine 0.76 - 1.27 mg/dL 1.35(H) 1.19 1.02  Sodium 134 - 144 mmol/L 142 136 133(L)  Potassium 3.5 - 5.2 mmol/L 5.1 4.9 4.5  Chloride 96 - 106 mmol/L 104 102 100  CO2 20 - 29 mmol/L 24 27 24   Calcium 8.6 - 10.2 mg/dL 9.8 9.3 9.1  Total Protein 6.1 - 8.1 g/dL - 6.3 6.7  Total Bilirubin 0.2 - 1.2 mg/dL - 0.6 1.4(H)  Alkaline Phos 38 - 126 U/L - -  101  AST 10 - 35 U/L - 22 53(H)  ALT 9 - 46 U/L - 37 129(H)   CBC Latest Ref Rng & Units 04/25/2021 02/21/2021 02/10/2021  WBC 3.4 - 10.8 x10E3/uL 5.3 5.8 -  Hemoglobin 13.0 - 17.7 g/dL 14.5 13.3 -  Hematocrit 37.5 - 51.0 % 43.1 39.3 -  Platelets 150 - 450 x10E3/uL 137(L) 182 121(L)    Lipid Panel Recent Labs    06/30/20 0808 01/15/21 0811  CHOL 104 81  TRIG 173* 174*  LDLCALC 37 15  VLDL  --  35  HDL 42 31*  CHOLHDL 2.5 2.6     HEMOGLOBIN A1C Lab Results  Component Value Date   HGBA1C 8.3 (A) 05/07/2021   MPG 180.03 01/15/2021   TSH Recent Labs    01/15/21 0811  TSH 2.684      Allergies   Allergies  Allergen Reactions   Lyrica [Pregabalin] Other (See Comments)    Made patient very lethargic the next morning, very hard to patient to function, move, etc.    Statins Other (See Comments)    Causes Restless Legs, rhabdomyolysis   Ativan [Lorazepam] Other (See Comments)    Markedly increased restless legs   Trulicity [Dulaglutide] Other (See Comments)    GI side effects     Medications Prior to Visit:   Outpatient Medications Prior to Visit  Medication Sig Dispense Refill   acetaminophen (TYLENOL) 500 MG tablet Take 500 mg by mouth every 6 (six) hours as needed for mild pain, moderate pain, headache or fever.     albuterol (PROVENTIL HFA;VENTOLIN HFA) 108 (90 Base) MCG/ACT inhaler Inhale 2 puffs into the lungs every 6 (six) hours as needed for wheezing or shortness of breath. 18 g 1   amLODipine (NORVASC) 5 MG tablet Take 1 tablet (5 mg total) by mouth daily. Can take an additional 5 mg of amlodipine for blood pressure Q000111Q mmHg stystolic (Patient taking differently: Take 5 mg by mouth every evening. Can take an additional 5 mg of amlodipine for blood pressure Q000111Q mmHg stystolic) 30 tablet 3   aspirin EC 81 MG tablet Take 1 tablet (81 mg total) by mouth daily. Swallow whole. (Patient taking differently: Take 81 mg by mouth every evening. Swallow whole.) 90 tablet 3   BD PEN NEEDLE NANO 2ND GEN 32G X 4 MM MISC USE AS DIRECTED 100 each 3   Cholecalciferol (D-5000) 125 MCG (5000 UT) TABS Take 1 tablet (5,000 Units total) by mouth daily. E55.9 (Patient taking differently: Take 5,000 Units by mouth every evening. E55.9) 90 tablet 3   clopidogrel  (PLAVIX) 75 MG tablet Take 1 tablet (75 mg total) by mouth daily. (Patient taking differently: Take 75 mg by mouth every evening.) 90 tablet 3   Continuous Blood Gluc Receiver (FREESTYLE LIBRE 14 DAY READER) DEVI Inject 1 Device as directed daily as needed. Use once daily as direct to check blood sugar E11.51 1 each 0   Continuous Blood Gluc Sensor (FREESTYLE LIBRE 14 DAY SENSOR) MISC Use to test blood sugar three times daily. E11.51. E11.59 2 each 12   fluticasone (FLONASE) 50 MCG/ACT nasal spray Place 1 spray into both nostrils daily as needed for allergies or rhinitis.     gabapentin (NEURONTIN) 100 MG capsule TAKE 2 CAPSULES BY MOUTH AT BEDTIME. 180 capsule 1   glucose 4 GM chewable tablet Chew 1 tablet (4 g total) by mouth as needed for low blood sugar. 50 tablet 12   insulin aspart (NOVOLOG FLEXPEN)  100 UNIT/ML FlexPen 3 times a day (just before each meal) 30-25-15 units, and pen needles 4/day 15 mL 11   Insulin Glargine (BASAGLAR KWIKPEN) 100 UNIT/ML Inject 40 units subcutaneously into the skin at bedtime 45 mL 4   isosorbide mononitrate (IMDUR) 60 MG 24 hr tablet Take 1 tablet (60 mg total) by mouth daily. 30 tablet 3   LIFESCAN FINEPOINT LANCETS MISC Use to test for blood sugar three times daily dx: E11.51 300 each 1   metoprolol succinate (TOPROL-XL) 25 MG 24 hr tablet TAKE 1 TABLET BY MOUTH DAILY. TAKE WITH OR IMMEDIATELY FOLLOWING A MEAL. 90 tablet 1   nitroGLYCERIN (NITROSTAT) 0.4 MG SL tablet PLACE 1 TABLET UNDER THE TONGUE EVERY 5 MINUTES AS NEEDED FOR CHEST PAIN. 50 tablet 0   ONETOUCH ULTRA test strip USE TO TEST FOR BLOOD SUGAR THREE TIMES DAILY DX: E11.51 300 strip 11   pantoprazole (PROTONIX) 40 MG tablet TAKE ONE TABLET BY MOUTH ONCE DAILY FOR STOMACH 90 tablet 1   PRALUENT 150 MG/ML SOAJ INJECT 1 PEN INTO THE SKIN EVERY 14 (FOURTEEN) DAYS. 2 mL 3   pramipexole (MIRAPEX) 1 MG tablet TAKE 1 TABLET BY MOUTH AT 5PM, 1 TABLET BY MOUTH AT 7PM, AND TAKE 2 TABLETS BY MOUTH AT BEDTIME.  360 tablet 1   valsartan (DIOVAN) 160 MG tablet Take 1 tablet (160 mg total) by mouth every evening. 90 tablet 3   Facility-Administered Medications Prior to Visit  Medication Dose Route Frequency Provider Last Rate Last Admin   [COMPLETED] hydrALAZINE (APRESOLINE) injection 10 mg  10 mg Intravenous Once [name], MD   10 mg at 05/01/21 1054   Final Medications at End of Visit    No outpatient medications have been marked as taking for the 05/16/21 encounter (Appointment) with Carlynn Purl, Mya Suell C, PA-C.   Radiology   No results found.   Cardiac Studies:   Lower extremity venous duplex for insufficiency 05/29/2017: Bilateral lower extremities negative for DVT or reflux.  Lower Extremity Arterial Duplex 06/22/2020: No hemodynamically significant stenoses are identified in the right or left lower extremity arterial system. Mildly  abnormal biphasic waveform noted throughout the lower extremity. The right and left mid posterior tibial and mid anterior tibial arteries are non-compressible due to medial arteriosclerosis.   PCV ECHOCARDIOGRAM COMPLETE 01/04/2021 Normal LV systolic function with visual EF 60-65%. Left ventricle cavity is normal in size. Mild left ventricular hypertrophy. Normal global wall motion. Normal diastolic filling pattern, normal LAP. Mild (Grade I) aortic regurgitation. Mild tricuspid regurgitation. No evidence of pulmonary hypertension. Compared to study 06/22/2020 MR is now resolved otherwise no significant change.   PCV MYOCARDIAL PERFUSION WO LEXISCAN 03/19/2021 Abnormal ECG stress. The patient exercised for 5 minutes and 58 seconds of a Bruce protocol, achieving approximately 6.99 METs.  Resting EKG shows PRWP. Peak EKG revealed no ST-T wave abnormalities. Recovery EKG revealed 1 mm horizontal ST depression of the inferolateral leads. This is abnormal response and mildly positive for ischemia. Normal BP response. Myocardial perfusion is normal. There is mild  diaphragmatic attenuation. Overall LV systolic function is normal without regional wall motion abnormalities. Stress LV EF: 66%. Compared to previous study: 2019/05/31, normal EKG response and no ischemia by nuclear perfusion, no chest pain. Low risk. Clinical correlation recommended.  Left Heart Catheterization 05/01/21:  LV 135/8, EDP 18 mmHg.  Ao 142/72, mean 104 mmHg.  No pressure gradient across the aortic valve. Left main: Not present LAD: Moderate to large vessel in the proximal segment however  it is diffusely diseased.  Proximal 3.0 x 12 mm Promus DES placed 05/03/2014 widely patent.  Mid LAD 2.75 x 38 mm Promus Premier DES, balloon angioplasty of D1 and distal LAD performed 03/29/2013 is widely patent. CX: Large vessel, ostial circumflex 30 to 40% stenosis unchanged from prior catheterization, a 3.5 x 16 mm Promus DES placed 04/08/2014 is widely patent. RCA: Large vessel, mid segment has a diffuse long segment 99% stenosis.  Previously placed 2.74 x 24 mm Promus Premier DES on 12/15/2012 is widely patent.   Interventional data: Successful PTCA and stenting of the mid and distal right coronary artery with 2 overlapping 2.5 x 38 mm and a 2.5 x 18 mm Onyx frontier DES, distal end of the stent overlapped 2.75 x 24 mm Promus Premier DES placed 12/15/2012.  TIMI II to TIMI-3 flow at the end of the procedure.   Recommendation: Patient to be observed for the small hematoma in the right forearm, but can be discharged home today if he remains stable, will be continued on aspirin and clopidogrel for at least 6 months.  110 mL contrast utilized.  EKG  ***   04/11/2021: Sinus rhythm with single PAC at a rate of 74 bpm.  Left axis.  Poor refreshing, cannot exclude anteroseptal infarct old.  Borderline criteria for LVH.  Nonspecific T wave abnormality.  Compared EKG 02/06/2021, no signs of acute change.  Assessment   No diagnosis found.  No orders of the defined types were placed in this encounter.     There are no discontinued medications.    Recommendations:   Kenneth Bro Sr.  is a 71 y.o. caucasian male with coronary artery disease, last cardiac catheterization on 05/29/2017 when he presented with unstable anginal-like symptoms revealing widely patent stent to his LAD, RCA and circumflex and also balloon angioplasty site to D1 and distal LAD.  Fortunately his last PCI has been in 2016.  Past medical history significant for hypertension, hyperlipidemia, GERD, Barrett's esophagus, statin intolerance, uncontrolled diabetes mellitus and also severe restless leg syndrome.  He also had florid COVID 19 infection in February 2021.  Patient underwent left heart catheterization 04/23/2021 with successful PCI to RCA and patient recommended for uninterrupted dual antiplatelet therapy for minimum of 6 months.  He did develop a small hematoma in his right forearm, however he was subsequently discharged and now presents for follow-up. ***  ***  Patient was last seen in our office 04/11/2021 with complaints of chest pain, therefore added Imdur 60 mg daily given uncontrolled hypertension and stable angina.  He now presents for follow-up.  Patient continues to have frequent anginal symptoms even with minimal exertion.  Suspect patient's elevated blood pressure is secondary to chest pain and associated anxiety.  Advised patient to monitor blood pressure when he is not having chest pain episodes.  We will add amlodipine both for hypertension control as well as antianginal benefit.  Given ongoing angina despite up titration of medical therapy recommended repeat left heart catheterization.   The left heart catheterization procedure was explained to the patient in detail. The indication, alternatives, risks and benefits were reviewed. Complications including but not limited to bleeding, infection, acute kidney injury, blood transfusion, heart rhythm disturbances, contrast (dye) reaction, damage to the arteries or nerves  in the legs or hands, cerebrovascular accident, myocardial infarction, need for emergent bypass surgery, blood clots in the legs, possible need for emergent blood transfusion, and rarely death were reviewed and discussed with the patient. The patient voices understanding and  wishes to proceed.  Patient was seen in collaboration with Dr. Einar Gip. He also reviewed patient's chart and examined the patient. Dr. Einar Gip is in agreement of the plan.    Alethia Berthold, PA-C 05/10/2021, 4:44 PM Office: [phone]

## 2021-05-11 ENCOUNTER — Other Ambulatory Visit (HOSPITAL_COMMUNITY): Payer: Self-pay

## 2021-05-11 NOTE — Telephone Encounter (Signed)
Called the pharmacy. Nothing more is needed. They are filling it now, copay is $35, and will be ready in an hour.

## 2021-05-16 ENCOUNTER — Other Ambulatory Visit: Payer: Self-pay

## 2021-05-16 ENCOUNTER — Encounter: Payer: Self-pay | Admitting: Student

## 2021-05-16 ENCOUNTER — Ambulatory Visit: Payer: Medicare Other | Admitting: Student

## 2021-05-16 VITALS — BP 166/83 | HR 65 | Temp 98.4°F | Resp 17 | Ht 68.0 in | Wt 210.0 lb

## 2021-05-16 DIAGNOSIS — I25118 Atherosclerotic heart disease of native coronary artery with other forms of angina pectoris: Secondary | ICD-10-CM

## 2021-05-16 DIAGNOSIS — I10 Essential (primary) hypertension: Secondary | ICD-10-CM

## 2021-05-16 DIAGNOSIS — Z955 Presence of coronary angioplasty implant and graft: Secondary | ICD-10-CM

## 2021-05-20 ENCOUNTER — Other Ambulatory Visit: Payer: Self-pay | Admitting: Student

## 2021-05-20 ENCOUNTER — Other Ambulatory Visit: Payer: Self-pay | Admitting: Cardiology

## 2021-05-20 DIAGNOSIS — E7800 Pure hypercholesterolemia, unspecified: Secondary | ICD-10-CM

## 2021-05-20 DIAGNOSIS — I25118 Atherosclerotic heart disease of native coronary artery with other forms of angina pectoris: Secondary | ICD-10-CM

## 2021-05-28 ENCOUNTER — Encounter: Payer: Self-pay | Admitting: Nurse Practitioner

## 2021-05-28 ENCOUNTER — Other Ambulatory Visit: Payer: Self-pay

## 2021-05-28 ENCOUNTER — Ambulatory Visit (INDEPENDENT_AMBULATORY_CARE_PROVIDER_SITE_OTHER): Payer: Medicare Other | Admitting: Nurse Practitioner

## 2021-05-28 VITALS — BP 148/84 | HR 68 | Temp 97.3°F | Ht 68.0 in | Wt 206.0 lb

## 2021-05-28 DIAGNOSIS — E1151 Type 2 diabetes mellitus with diabetic peripheral angiopathy without gangrene: Secondary | ICD-10-CM

## 2021-05-28 DIAGNOSIS — E7800 Pure hypercholesterolemia, unspecified: Secondary | ICD-10-CM

## 2021-05-28 DIAGNOSIS — I209 Angina pectoris, unspecified: Secondary | ICD-10-CM | POA: Diagnosis not present

## 2021-05-28 DIAGNOSIS — G2581 Restless legs syndrome: Secondary | ICD-10-CM | POA: Diagnosis not present

## 2021-05-28 DIAGNOSIS — J410 Simple chronic bronchitis: Secondary | ICD-10-CM

## 2021-05-28 DIAGNOSIS — I10 Essential (primary) hypertension: Secondary | ICD-10-CM | POA: Diagnosis not present

## 2021-05-28 DIAGNOSIS — I25118 Atherosclerotic heart disease of native coronary artery with other forms of angina pectoris: Secondary | ICD-10-CM | POA: Diagnosis not present

## 2021-05-28 MED ORDER — ALBUTEROL SULFATE HFA 108 (90 BASE) MCG/ACT IN AERS: 2.0000 | INHALATION_SPRAY | Freq: Four times a day (QID) | RESPIRATORY_TRACT | 1 refills | Status: DC | PRN

## 2021-05-28 MED ORDER — METOPROLOL SUCCINATE ER 25 MG PO TB24: 25.0000 mg | ORAL_TABLET | Freq: Every day | ORAL | 1 refills | Status: DC

## 2021-05-28 NOTE — Progress Notes (Signed)
? ? ?Careteam: ?Patient Care Team: ?[name], NP as PCP - General (Geriatric Medicine) ?[name], MD as Consulting Physician (Cardiology) ?Van Clines, MD as Consulting Physician (Neurology) ?[name], Herbert Seta, OD (Optometry) ?[name], MD as Consulting Physician (Otolaryngology) ? ?PLACE OF SERVICE:  ?[facility]  ?Advanced Directive information ?Does Patient Have a Medical Advance Directive?: No, Would patient like information on creating a medical advance directive?: No - Patient declined ? ?Allergies  ?Allergen Reactions  ? Lyrica [Pregabalin] Other (See Comments)  ?  Made patient very lethargic the next morning, very hard to patient to function, move, etc.   ? Statins Other (See Comments)  ?  Causes Restless Legs, rhabdomyolysis  ? Ativan [Lorazepam] Other (See Comments)  ?  Markedly increased restless legs  ? Trulicity [Dulaglutide] Other (See Comments)  ?  GI side effects   ? ? ?Chief Complaint  ?Patient presents with  ? Medical Management of Chronic Issues  ?  2 month follow-up. Discuss need for covid boosters, shingrix, pneumonia vaccine and flu vaccine or post pone if patient refuses. Discuss imdur.   ? ? ? ?HPI: Patient is a 71 y.o. male for follow up ? ?He had CABG last month, has followed up with cardiologist. He was told to continue imdur but he has not been taking. He plans to restart as he has this at home.  ?He is also out of metoprolol and has not been taking.  ? ?DM- a1c is elevated, he is following up with endocrinologist. Had a change in insulin at last visit. Now he is taking 30 units with meals but he has been adjusting insulin based on what he eats.  ?He is adjusting long and short acting. He has been keeping a close record and in touch with the endocrinologist.  ? ?He has been several neurologist but has not found a proper treatment plan. He plans to go to [facility] for further treatment- ativan made it worse not sure if gabapentin helping at all. Was up to 400 mg and all that  did was make it longer for him to wake up in the morning. Mirapex just "knocks him out"- does not help symptoms overall.  ? ?His biggest concern is blood sugar and restless leg.  ? ?He feels the best he has felt in a year.  ? ? ?Review of Systems:  ?Review of Systems  ?Constitutional:  Negative for chills, fever and weight loss.  ?HENT:  Negative for tinnitus.   ?Respiratory:  Negative for cough, sputum production and shortness of breath.   ?Cardiovascular:  Negative for chest pain, palpitations and leg swelling.  ?Gastrointestinal:  Negative for abdominal pain, constipation, diarrhea and heartburn.  ?Genitourinary:  Negative for dysuria, frequency and urgency.  ?Musculoskeletal:  Negative for back pain, falls, joint pain and myalgias.  ?Skin: Negative.   ?Neurological:  Negative for dizziness and headaches.  ?Psychiatric/Behavioral:  Negative for depression and memory loss. The patient does not have insomnia.   ? ?Past Medical History:  ?Diagnosis Date  ? Arthritis   ? "left thumb" (05/23/2017) right shoulder  ? Barrett's esophagus   ? "we've been told that it's all gone; still take RX for GERD" (05/23/2017)  ? Bilateral swelling of feet and ankles x 3 weeks as of 12-01-2019  ? CAD (coronary artery disease), native coronary artery   ? Coronary angiogram 05/03/2014:  Proximal LAD 3.0x12 mm Promus premier stent.  04/08/2014: Mid Cx 3.5 x 16 mm Promus DES.  03/29/2013: Mid LAD 2.75  x 38 mm Promus Premier drug-eluting stent, balloon angioplasty of D1 and distal LAD and stenting of distal RCA with 2.75 x 24 mm promos Premier drug-eluting stent 12/15/2012 widely patent.  ? Cervicalgia   ? Chronic bronchitis ([place])   ? "q yr" (05/23/2017)  ? Complication of anesthesia   ? " I do not wake up very well "  slow to wake up  ? COVID-19 05/2019  ? all covid symptoms in hospital x 5 days, all symptoms resolved took monoclonal antibody tx   ? Dyspnea   ? with exertion  ? Family history of adverse reaction to anesthesia   ? "son w/PONV"  ?  GERD (gastroesophageal reflux disease)   ? Heart murmur   ? "grew out of it"   ? Hyperlipidemia   ? Hypertension   ? Hypoglycemia, unspecified   ? IDDM (insulin dependent diabetes mellitus)   ? type 2  ? Impotence of organic origin   ? Iron deficiency anemia   ? Memory loss   ? mild  ? Other malaise and fatigue   ? Pneumonia 05/2019  ? PONV (postoperative nausea and vomiting)   ? after wisdom teeth pulled, no problems with other surgeries  ? Restless leg   ? Rotator cuff arthropathy   ? Stroke [facility])   ? Tension headache   ? "sometimes" (05/23/2017)  ? Unspecified gastritis and gastroduodenitis with hemorrhage   ? Unstable angina pectoris ([place]) 04/08/2014  ? Coronary angiogram 05/03/2014:  Proximal LAD 3.0x12 mm Promus premier stent.  04/08/2014: Mid Cx 3.5 x 16 mm Promus DES.  03/29/2013: Mid LAD 2.75 x 38 mm Promus Premier drug-eluting stent, balloon angioplasty of D1 and distal LAD and stenting of distal RCA with 2.75 x 24 mm promos Premier drug-eluting stent 12/15/2012 widely patent.  ? ?Past Surgical History:  ?Procedure Laterality Date  ? BICEPT TENODESIS Right 12/07/2019  ? Procedure: RIGHT BICEPS TENODESIS;  Surgeon: Renette Butters, MD;  Location: [facility];  Service: Orthopedics;  Laterality: Right;  ? CARDIAC CATHETERIZATION N/A 09/25/2015  ? Procedure: Left Heart Cath and Coronary Angiography;  Surgeon: Adrian Prows, MD;  Location: [place] [facility];  Service: Cardiovascular;  Laterality: N/A;  ? CARDIAC CATHETERIZATION N/A 09/25/2015  ? Procedure: Intravascular Pressure Wire/FFR Study;  Surgeon: Adrian Prows, MD;  Location: [place] [facility];  Service: Cardiovascular;  Laterality: N/A;  ? CORONARY ANGIOPLASTY WITH STENT PLACEMENT  12/15/2012; 04/28/2014; 05/03/2014  ? "2; 1; 1" , 4 stents 2 balloons  ? CORONARY STENT INTERVENTION N/A 05/01/2021  ? Procedure: CORONARY STENT INTERVENTION;  Surgeon: Adrian Prows, MD;  Location: [place] [facility];  Service: Cardiovascular;  Laterality: N/A;  ? FRACTIONAL  FLOW RESERVE WIRE Right 03/26/2013  ? Procedure: FRACTIONAL FLOW RESERVE WIRE;  Surgeon: Laverda Page, MD;  Location: [facility] CATH LAB;  Service: Cardiovascular;  Laterality: Right;  ? FRACTIONAL FLOW RESERVE WIRE N/A 05/03/2014  ? Procedure: FRACTIONAL FLOW RESERVE WIRE;  Surgeon: Laverda Page, MD;  Location: [facility] CATH LAB;  Service: Cardiovascular;  Laterality: N/A;  ? HERNIA REPAIR  [id number]  ? "umbilical"   ? INCISION AND DRAINAGE ABSCESS Left 05/2007  ? "groin"   ? KNEE SURGERY Right 1992;  2000  ? "calcium deposits removed" (12/15/2012)  ? LEFT HEART CATH AND CORONARY ANGIOGRAPHY N/A 05/23/2017  ? Procedure: LEFT HEART CATH AND CORONARY ANGIOGRAPHY;  Surgeon: Adrian Prows, MD;  Location: [place] [facility];  Service: Cardiovascular;  Laterality: N/A;  ? LEFT  HEART CATH AND CORONARY ANGIOGRAPHY N/A 05/01/2021  ? Procedure: LEFT HEART CATH AND CORONARY ANGIOGRAPHY;  Surgeon: Adrian Prows, MD;  Location: [place] [facility];  Service: Cardiovascular;  Laterality: N/A;  ? LEFT HEART CATHETERIZATION WITH CORONARY ANGIOGRAM N/A 12/15/2012  ? Procedure: LEFT HEART CATHETERIZATION WITH CORONARY ANGIOGRAM;  Surgeon: Laverda Page, MD;  Location: [facility] CATH LAB;  Service: Cardiovascular;  Laterality: N/A;  ? LEFT HEART CATHETERIZATION WITH CORONARY ANGIOGRAM N/A 03/26/2013  ? Procedure: LEFT HEART CATHETERIZATION WITH CORONARY ANGIOGRAM;  Surgeon: Laverda Page, MD;  Location: [facility] CATH LAB;  Service: Cardiovascular;  Laterality: N/A;  ? LEFT HEART CATHETERIZATION WITH CORONARY ANGIOGRAM N/A 04/08/2014  ? Procedure: LEFT HEART CATHETERIZATION WITH CORONARY ANGIOGRAM;  Surgeon: Blane Ohara, MD;  Location[facility];  Service: Cardiovascular;  Laterality: N/A;  ? PERCUTANEOUS CORONARY STENT INTERVENTION (PCI-S) N/A 05/03/2014  ? Procedure: PERCUTANEOUS CORONARY STENT INTERVENTION (PCI-S);  Surgeon: Laverda Page, MD;  Location: [facility] CATH LAB;  Service: Cardiovascular;  Laterality: N/A;  ? RADIOLOGY WITH ANESTHESIA N/A  02/08/2021  ? Procedure: MRI WITH ANESTHESIA OF ABDOMEN WITH AND WITHOUT CONSTRAST;  Surgeon: Radiologist, Medication, MD;  Location: [place];  Service: Radiology;  Laterality: N/A;  ? ROTATOR CUFF REPAIR    ? UM

## 2021-05-28 NOTE — Patient Instructions (Addendum)
To stop gabapentin.  ? ?Increase protein in diet.  ? ? ?

## 2021-05-31 ENCOUNTER — Encounter: Payer: Self-pay | Admitting: Endocrinology

## 2021-05-31 ENCOUNTER — Telehealth: Payer: Self-pay

## 2021-05-31 ENCOUNTER — Other Ambulatory Visit: Payer: Self-pay | Admitting: Endocrinology

## 2021-05-31 ENCOUNTER — Ambulatory Visit (INDEPENDENT_AMBULATORY_CARE_PROVIDER_SITE_OTHER): Payer: Medicare Other | Admitting: Endocrinology

## 2021-05-31 ENCOUNTER — Encounter: Payer: Self-pay | Admitting: Cardiology

## 2021-05-31 VITALS — BP 128/78 | HR 65 | Ht 68.0 in | Wt 207.0 lb

## 2021-05-31 DIAGNOSIS — Z794 Long term (current) use of insulin: Secondary | ICD-10-CM

## 2021-05-31 DIAGNOSIS — E119 Type 2 diabetes mellitus without complications: Secondary | ICD-10-CM

## 2021-05-31 DIAGNOSIS — I209 Angina pectoris, unspecified: Secondary | ICD-10-CM

## 2021-05-31 MED ORDER — INSULIN LISPRO (1 UNIT DIAL) 100 UNIT/ML (KWIKPEN): PEN_INJECTOR | SUBCUTANEOUS | 3 refills | Status: DC

## 2021-05-31 MED ORDER — NOVOLOG FLEXPEN 100 UNIT/ML ~~LOC~~ SOPN: PEN_INJECTOR | SUBCUTANEOUS | 11 refills | Status: DC

## 2021-05-31 MED ORDER — BASAGLAR KWIKPEN 100 UNIT/ML ~~LOC~~ SOPN: 30.0000 [IU] | PEN_INJECTOR | Freq: Every day | SUBCUTANEOUS | 3 refills | Status: DC

## 2021-05-31 NOTE — Progress Notes (Signed)
? ?Subjective:  ? ? Patient ID: Kenneth Ayala Sr., male    DOB: 1950/07/15, 71 y.o.   MRN: [id number] ? ?HPI ?Pt returns for f/u of diabetes mellitus:  ?DM type: Insulin-requiring type 2 ?Dx'ed: 2011 ?Complications: stage 3 CRI and CAD.   ?Therapy: insulin since 2016.  ?DKA: never ?Severe hypoglycemia: 2019 ?Pancreatitis: never ?Pancreatic imaging: normal on 2020 CT ?SDOH: Wife provides hx, due to pt's memory loss; he takes multiple daily injections.  ?Other: He did not tolerate trulicity (nausea); he eats meals at 7AM, 12N, and 5PM.   ?Interval history: He has nausea, which he attributes to metformin.  He has mild hypoglycemia.  This happens at approx 2AM.  I reviewed continuous glucose monitor data.  Glucose varies from 85-340.  It is in general highest at 8-9AM, and at 11PM.  It is lowest at [facility].  It decreases overnight, then increases until 8AM. ?Past Medical History:  ?Diagnosis Date  ? Arthritis   ? "left thumb" (05/23/2017) right shoulder  ? Barrett's esophagus   ? "we've been told that it's all gone; still take RX for GERD" (05/23/2017)  ? Bilateral swelling of feet and ankles x 3 weeks as of 12-01-2019  ? CAD (coronary artery disease), native coronary artery   ? Coronary angiogram 05/03/2014:  Proximal LAD 3.0x12 mm Promus premier stent.  04/08/2014: Mid Cx 3.5 x 16 mm Promus DES.  03/29/2013: Mid LAD 2.75 x 38 mm Promus Premier drug-eluting stent, balloon angioplasty of D1 and distal LAD and stenting of distal RCA with 2.75 x 24 mm promos Premier drug-eluting stent 12/15/2012 widely patent.  ? Cervicalgia   ? Chronic bronchitis ([place])   ? "q yr" (05/23/2017)  ? Complication of anesthesia   ? " I do not wake up very well "  slow to wake up  ? COVID-19 05/2019  ? all covid symptoms in hospital x 5 days, all symptoms resolved took monoclonal antibody tx   ? Dyspnea   ? with exertion  ? Family history of adverse reaction to anesthesia   ? "son w/PONV"  ? GERD (gastroesophageal reflux disease)   ? Heart murmur   ? "grew out  of it"   ? Hyperlipidemia   ? Hypertension   ? Hypoglycemia, unspecified   ? IDDM (insulin dependent diabetes mellitus)   ? type 2  ? Impotence of organic origin   ? Iron deficiency anemia   ? Memory loss   ? mild  ? Other malaise and fatigue   ? Pneumonia 05/2019  ? PONV (postoperative nausea and vomiting)   ? after wisdom teeth pulled, no problems with other surgeries  ? Restless leg   ? Rotator cuff arthropathy   ? Stroke [facility])   ? Tension headache   ? "sometimes" (05/23/2017)  ? Unspecified gastritis and gastroduodenitis with hemorrhage   ? Unstable angina pectoris ([place]) 04/08/2014  ? Coronary angiogram 05/03/2014:  Proximal LAD 3.0x12 mm Promus premier stent.  04/08/2014: Mid Cx 3.5 x 16 mm Promus DES.  03/29/2013: Mid LAD 2.75 x 38 mm Promus Premier drug-eluting stent, balloon angioplasty of D1 and distal LAD and stenting of distal RCA with 2.75 x 24 mm promos Premier drug-eluting stent 12/15/2012 widely patent.  ? ? ?Past Surgical History:  ?Procedure Laterality Date  ? BICEPT TENODESIS Right 12/07/2019  ? Procedure: RIGHT BICEPS TENODESIS;  Surgeon: Renette Butters, MD;  Location[facility];  Service: Orthopedics;  Laterality: Right;  ? CARDIAC CATHETERIZATION N/A 09/25/2015  ?  Procedure: Left Heart Cath and Coronary Angiography;  Surgeon: Adrian Prows, MD;  Location: [place] [facility];  Service: Cardiovascular;  Laterality: N/A;  ? CARDIAC CATHETERIZATION N/A 09/25/2015  ? Procedure: Intravascular Pressure Wire/FFR Study;  Surgeon: Adrian Prows, MD;  Location: [place] [facility];  Service: Cardiovascular;  Laterality: N/A;  ? CORONARY ANGIOPLASTY WITH STENT PLACEMENT  12/15/2012; 04/28/2014; 05/03/2014  ? "2; 1; 1" , 4 stents 2 balloons  ? CORONARY STENT INTERVENTION N/A 05/01/2021  ? Procedure: CORONARY STENT INTERVENTION;  Surgeon: Adrian Prows, MD;  Location: [place] [facility];  Service: Cardiovascular;  Laterality: N/A;  ? FRACTIONAL FLOW RESERVE WIRE Right 03/26/2013  ? Procedure: FRACTIONAL FLOW  RESERVE WIRE;  Surgeon: Laverda Page, MD;  Location: [facility] CATH LAB;  Service: Cardiovascular;  Laterality: Right;  ? FRACTIONAL FLOW RESERVE WIRE N/A 05/03/2014  ? Procedure: FRACTIONAL FLOW RESERVE WIRE;  Surgeon: Laverda Page, MD;  Location: [facility] CATH LAB;  Service: Cardiovascular;  Laterality: N/A;  ? HERNIA REPAIR  [id number]  ? "umbilical"   ? INCISION AND DRAINAGE ABSCESS Left 05/2007  ? "groin"   ? KNEE SURGERY Right 1992;  2000  ? "calcium deposits removed" (12/15/2012)  ? LEFT HEART CATH AND CORONARY ANGIOGRAPHY N/A 05/23/2017  ? Procedure: LEFT HEART CATH AND CORONARY ANGIOGRAPHY;  Surgeon: Adrian Prows, MD;  Location: [place] [facility];  Service: Cardiovascular;  Laterality: N/A;  ? LEFT HEART CATH AND CORONARY ANGIOGRAPHY N/A 05/01/2021  ? Procedure: LEFT HEART CATH AND CORONARY ANGIOGRAPHY;  Surgeon: Adrian Prows, MD;  Location: [place] [facility];  Service: Cardiovascular;  Laterality: N/A;  ? LEFT HEART CATHETERIZATION WITH CORONARY ANGIOGRAM N/A 12/15/2012  ? Procedure: LEFT HEART CATHETERIZATION WITH CORONARY ANGIOGRAM;  Surgeon: Laverda Page, MD;  Location: [facility] CATH LAB;  Service: Cardiovascular;  Laterality: N/A;  ? LEFT HEART CATHETERIZATION WITH CORONARY ANGIOGRAM N/A 03/26/2013  ? Procedure: LEFT HEART CATHETERIZATION WITH CORONARY ANGIOGRAM;  Surgeon: Laverda Page, MD;  Location: [facility] CATH LAB;  Service: Cardiovascular;  Laterality: N/A;  ? LEFT HEART CATHETERIZATION WITH CORONARY ANGIOGRAM N/A 04/08/2014  ? Procedure: LEFT HEART CATHETERIZATION WITH CORONARY ANGIOGRAM;  Surgeon: Blane Ohara, MD;  Location: [facility] LAB;  Service: Cardiovascular;  Laterality: N/A;  ? PERCUTANEOUS CORONARY STENT INTERVENTION (PCI-S) N/A 05/03/2014  ? Procedure: PERCUTANEOUS CORONARY STENT INTERVENTION (PCI-S);  Surgeon: Laverda Page, MD;  Location: [facility] CATH LAB;  Service: Cardiovascular;  Laterality: N/A;  ? RADIOLOGY WITH ANESTHESIA N/A 02/08/2021  ? Procedure: MRI WITH ANESTHESIA OF ABDOMEN WITH AND WITHOUT  CONSTRAST;  Surgeon: Radiologist, Medication, MD;  Location: [place];  Service: Radiology;  Laterality: N/A;  ? ROTATOR CUFF REPAIR    ? UMBILICAL HERNIA REPAIR  yrs ago  ? ? ?Social History  ? ?Socioeconomic History  ? Marital status: Married  ?  Spouse name: Pam  ? Number of children: 2  ? Years of education: Not on file  ? Highest education level: Not on file  ?Occupational History  ? Occupation: Administrator  ?  Comment: deliveries only now  ? Occupation: woodworking  ?Tobacco Use  ? Smoking status: Never  ? Smokeless tobacco: Never  ?Vaping Use  ? Vaping Use: Never used  ?Substance and Sexual Activity  ? Alcohol use: No  ? Drug use: No  ? Sexual activity: Not Currently  ?Other Topics Concern  ? Not on file  ?Social History Narrative  ? Not on file  ? ?Social Determinants of Health  ? ?  Financial Resource Strain: Low Risk   ? Difficulty of Paying Living Expenses: Not very hard  ?Food Insecurity: No Food Insecurity  ? Worried About Charity fundraiser in the Last Year: Never true  ? Ran Out of Food in the Last Year: Never true  ?Transportation Needs: No Transportation Needs  ? Lack of Transportation (Medical): No  ? Lack of Transportation (Non-Medical): No  ?Physical Activity: Not on file  ?Stress: Not on file  ?Social Connections: Not on file  ?Intimate Partner Violence: Not on file  ? ? ?Current Outpatient Medications on File Prior to Visit  ?Medication Sig Dispense Refill  ? acetaminophen (TYLENOL) 500 MG tablet Take 500 mg by mouth every 6 (six) hours as needed for mild pain, moderate pain, headache or fever.    ? albuterol (VENTOLIN HFA) 108 (90 Base) MCG/ACT inhaler Inhale 2 puffs into the lungs every 6 (six) hours as needed for wheezing or shortness of breath. 18 g 1  ? amLODipine (NORVASC) 5 MG tablet Take 1 tablet (5 mg total) by mouth daily. Can take an additional 5 mg of amlodipine for blood pressure [id number] mmHg stystolic 90 tablet 1  ? aspirin EC 81 MG tablet Take 1 tablet (81 mg total) by mouth daily.  Swallow whole. 90 tablet 3  ? BD PEN NEEDLE NANO 2ND GEN 32G X 4 MM MISC USE AS DIRECTED 100 each 3  ? Cholecalciferol (D-5000) 125 MCG (5000 UT) TABS Take 1 tablet (5,000 Units total) by mouth daily. E55.9 9

## 2021-05-31 NOTE — Telephone Encounter (Signed)
Pt called and stated that he is SOB at night. He wakes up in the middle of the night and it feels like he is drowning. He feels this especially when he exerts himself, but mostly at night time. This has been going on for a week or two and has gotten worse. No chest pain, just a tight feeling. Pt has tried using his sons inhaler one time and he does not feel a difference. He is unsure of what to do. Please advise.  ?

## 2021-05-31 NOTE — Telephone Encounter (Signed)
Most probably due to Acid reflux being made worse with Plavix. ? ?He should try Pepcid 40 mg daily fo r2 weeks. I can sent Rx

## 2021-05-31 NOTE — Patient Instructions (Addendum)
check your blood sugar twice a day.  vary the time of day when you check, between before the 3 meals, and at bedtime.  also check if you have symptoms of your blood sugar being too high or too low.  please keep a record of the readings and bring it to your next appointment here (or you can bring the meter itself).  You can write it on any piece of paper.  please call us sooner if your blood sugar goes below 70, or if most of your readings are over 200.  ?Please change the Novolog to 3 times a day (just before each meal) 30-25-25 units, and:  ?Please reduce the Basaglar to 30 units at bedtime.   ?Please come back for a follow-up appointment in 1 month.   ?

## 2021-06-01 NOTE — Telephone Encounter (Signed)
Pt has not had any symptoms today. He said he is taking pantoprazole but will try the Pepcid out but wants to know if he should stop the pantoprazol.

## 2021-06-01 NOTE — Telephone Encounter (Signed)
He can take both on the days of symptoms, otherwise one or the other

## 2021-06-04 NOTE — Telephone Encounter (Signed)
Patient agreed to try Pepcid 40 mg

## 2021-06-19 ENCOUNTER — Other Ambulatory Visit: Payer: Self-pay | Admitting: Cardiology

## 2021-06-19 DIAGNOSIS — I25118 Atherosclerotic heart disease of native coronary artery with other forms of angina pectoris: Secondary | ICD-10-CM

## 2021-06-22 ENCOUNTER — Other Ambulatory Visit: Payer: Self-pay | Admitting: Nurse Practitioner

## 2021-06-29 ENCOUNTER — Telehealth (HOSPITAL_COMMUNITY): Payer: Self-pay

## 2021-06-29 NOTE — Telephone Encounter (Signed)
**  UPDATED** ?  ?Pt insurance is active and benefits verified through Medicare A/B. Co-pay $0.00, DED $226.00/$226.00 met, out of pocket $0.00/$0.00 met, co-insurance 20%. No pre-authorization required. Passport, 06/29/21 @ 9:03AM, REF#[id number] ?  ?2ndary insurance is active and benefits verified through [employer]. Co-pay $0.00, DED $0.00/$0.00 met, out of pocket $0.00/$0.00 met, co-insurance 0%. No pre-authorization required. Passport, 06/29/21 @ 9:05AM, TDD#[id number]  ?

## 2021-06-29 NOTE — Telephone Encounter (Signed)
Called patient to see if he was interested in participating in the Cardiac Rehab Program. Patient stated yes. Patient will come in for orientation on 07/03/21 @ 8AM and will attend the 830AM exercise class. ?  ?Pensions consultant.  ?

## 2021-06-29 NOTE — Telephone Encounter (Signed)
Successful telephone encounter to patient to confirm cardiac rehab orientation appointment for May 2 at 8:30 am. Unfortunately patient could not complete health history during this call secondary to time constraints. He request a call back later this evening or Monday. Patient did confirm appointment. Will follow up at a later time per patient request.  ?

## 2021-07-02 ENCOUNTER — Telehealth (HOSPITAL_COMMUNITY): Payer: Self-pay | Admitting: *Deleted

## 2021-07-02 ENCOUNTER — Ambulatory Visit (INDEPENDENT_AMBULATORY_CARE_PROVIDER_SITE_OTHER): Payer: Medicare Other | Admitting: Nurse Practitioner

## 2021-07-02 ENCOUNTER — Telehealth: Payer: Self-pay

## 2021-07-02 ENCOUNTER — Encounter: Payer: Self-pay | Admitting: Nurse Practitioner

## 2021-07-02 VITALS — BP 130/80 | HR 65 | Temp 97.7°F

## 2021-07-02 DIAGNOSIS — G2581 Restless legs syndrome: Secondary | ICD-10-CM

## 2021-07-02 DIAGNOSIS — I209 Angina pectoris, unspecified: Secondary | ICD-10-CM | POA: Diagnosis not present

## 2021-07-02 DIAGNOSIS — J069 Acute upper respiratory infection, unspecified: Secondary | ICD-10-CM

## 2021-07-02 MED ORDER — BENZONATATE 100 MG PO CAPS: 100.0000 mg | ORAL_CAPSULE | Freq: Two times a day (BID) | ORAL | 0 refills | Status: DC | PRN

## 2021-07-02 NOTE — Patient Instructions (Addendum)
To use mucinex DM by mouth twice daily with full glass of water ?Stay hydrated- lot of water ?Can use benzonatate as needed cough.  ?Also to use albuterol if wheezing/cough as needed  ?

## 2021-07-02 NOTE — Progress Notes (Signed)
Careteam: Patient Care Team: Kenneth Seller, NP as PCP - General (Geriatric Medicine) Kenneth Decamp, MD as Consulting Physician (Cardiology) Kenneth Clines, MD as Consulting Physician (Neurology) [name], OD (Optometry) Kenneth Reading, MD as Consulting Physician (Otolaryngology)  PLACE OF SERVICE:  [facility]  Advanced Directive information    Allergies  Allergen Reactions   Lyrica [Pregabalin] Other (See Comments)    Made patient very lethargic the next morning, very hard to patient to function, move, etc.    Statins Other (See Comments)    Causes Restless Legs, rhabdomyolysis   Ativan [Lorazepam] Other (See Comments)    Markedly increased restless legs   Trulicity [Dulaglutide] Other (See Comments)    GI side effects     Chief Complaint  Patient presents with   Acute Visit    Patient has cough. Cough is productive. He tested negative for COVID today.Patient has had cough since Friday. Phlegm is white and yellow.     HPI: Patient is a 71 y.o. male for acute cough.  Has had cough, head congestion for 4 days.  Had mild sore throat and hoariness.  Has been around someone that was sick. Was in a [place] with someone for several hours.  Coughing up white and yellow phlegm.  No worsening shortness of breath. Having wheezing but this has been off and on.  Using albuterol which has been beneficial.  Using mucinex DM every 4 to 6 hours.  No fevers.  Has not felt that bad. Taking benzonatate which has been working.    Review of Systems:  Review of Systems  Constitutional:  Negative for chills, fever and weight loss.  HENT:  Positive for congestion. Negative for tinnitus.   Respiratory:  Positive for cough and sputum production. Negative for shortness of breath.   Cardiovascular:  Negative for chest pain, palpitations and leg swelling.  Gastrointestinal:  Negative for abdominal pain, constipation, diarrhea and heartburn.  Genitourinary:  Negative for dysuria,  frequency and urgency.  Musculoskeletal:  Negative for back pain, falls, joint pain and myalgias.  Skin: Negative.   Neurological:  Negative for dizziness and headaches.       Ongoing restless leg   Past Medical History:  Diagnosis Date   Arthritis    "left thumb" (05/23/2017) right shoulder   Barrett's esophagus    "we've been told that it's all gone; still take RX for GERD" (05/23/2017)   Bilateral swelling of feet and ankles x 3 weeks as of 12-01-2019   CAD (coronary artery disease), native coronary artery    Coronary angiogram 05/03/2014:  Proximal LAD 3.0x12 mm Promus premier stent.  04/08/2014: Mid Cx 3.5 x 16 mm Promus DES.  03/29/2013: Mid LAD 2.75 x 38 mm Promus Premier drug-eluting stent, balloon angioplasty of D1 and distal LAD and stenting of distal RCA with 2.75 x 24 mm promos Premier drug-eluting stent 12/15/2012 widely patent.   Cervicalgia    Chronic bronchitis (HCC)    "q yr" (05/23/2017)   Complication of anesthesia    " I do not wake up very well "  slow to wake up   COVID-19 05/2019   all covid symptoms in hospital x 5 days, all symptoms resolved took monoclonal antibody tx    Dyspnea    with exertion   Family history of adverse reaction to anesthesia    "son w/PONV"   GERD (gastroesophageal reflux disease)    Heart murmur    "grew out of it"    Hyperlipidemia  Hypertension    Hypoglycemia, unspecified    IDDM (insulin dependent diabetes mellitus)    type 2   Impotence of organic origin    Iron deficiency anemia    Memory loss    mild   Other malaise and fatigue    Pneumonia 05/2019   PONV (postoperative nausea and vomiting)    after wisdom teeth pulled, no problems with other surgeries   Restless leg    Rotator cuff arthropathy    Stroke (HCC)    Tension headache    "sometimes" (05/23/2017)   Unspecified gastritis and gastroduodenitis with hemorrhage    Unstable angina pectoris (HCC) 04/08/2014   Coronary angiogram 05/03/2014:  Proximal LAD 3.0x12 mm Promus  premier stent.  04/08/2014: Mid Cx 3.5 x 16 mm Promus DES.  03/29/2013: Mid LAD 2.75 x 38 mm Promus Premier drug-eluting stent, balloon angioplasty of D1 and distal LAD and stenting of distal RCA with 2.75 x 24 mm promos Premier drug-eluting stent 12/15/2012 widely patent.   Past Surgical History:  Procedure Laterality Date   BICEPT TENODESIS Right 12/07/2019   Procedure: RIGHT BICEPS TENODESIS;  Surgeon: Sheral Apley, MD;  Location: [facility];  Service: Orthopedics;  Laterality: Right;   CARDIAC CATHETERIZATION N/A 09/25/2015   Procedure: Left Heart Cath and Coronary Angiography;  Surgeon: Kenneth Decamp, MD;  Location: [facility] INVASIVE CV LAB;  Service: Cardiovascular;  Laterality: N/A;   CARDIAC CATHETERIZATION N/A 09/25/2015   Procedure: Intravascular Pressure Wire/FFR Study;  Surgeon: Kenneth Decamp, MD;  Location: [facility] INVASIVE CV LAB;  Service: Cardiovascular;  Laterality: N/A;   CORONARY ANGIOPLASTY WITH STENT PLACEMENT  12/15/2012; 04/28/2014; 05/03/2014   "2; 1; 1" , 4 stents 2 balloons   CORONARY STENT INTERVENTION N/A 05/01/2021   Procedure: CORONARY STENT INTERVENTION;  Surgeon: Kenneth Decamp, MD;  Location: [facility] INVASIVE CV LAB;  Service: Cardiovascular;  Laterality: N/A;   FRACTIONAL FLOW RESERVE WIRE Right 03/26/2013   Procedure: FRACTIONAL FLOW RESERVE WIRE;  Surgeon: Pamella Pert, MD;  Location: [facility] CATH LAB;  Service: Cardiovascular;  Laterality: Right;   FRACTIONAL FLOW RESERVE WIRE N/A 05/03/2014   Procedure: FRACTIONAL FLOW RESERVE WIRE;  Surgeon: Pamella Pert, MD;  Location: [facility] CATH LAB;  Service: Cardiovascular;  Laterality: N/A;   HERNIA REPAIR  2010   "umbilical"    INCISION AND DRAINAGE ABSCESS Left 05/2007   "groin"    KNEE SURGERY Right 1992;  2000   "calcium deposits removed" (12/15/2012)   LEFT HEART CATH AND CORONARY ANGIOGRAPHY N/A 05/23/2017   Procedure: LEFT HEART CATH AND CORONARY ANGIOGRAPHY;  Surgeon: Kenneth Decamp, MD;  [facility]: [facility] INVAS[facility] CV LAB;  Service:  Cardiovascular;  Laterality: N/A;   LEFT HEART CATH AND CORONARY ANGIOGRAPHY N/A 05/01/2021   Procedure: LEFT HEART CATH AND CORONARY ANGIOGRAPHY;  Surgeon: Kenneth Decamp, MD;  [facility]: [facility] INVASIVE CV LAB;  Service: Cardiovascular;  Laterality: N/A;   LEFT HEART CATHETERIZATION WITH CORONARY ANGIOGRAM N/A 12/15/2012   Procedure: LEFT HEART CATHETERIZATION WITH CORONARY ANGIOGRAM;  Surgeon: Pamella Pert, MD;  Location: [facility] CATH LAB;  Service: Cardiovascular;  Laterality: N/A;   LEFT HEART CATHETERIZATION WITH CORONARY ANGIOGRAM N/A 03/26/2013   Procedure: LEFT HEART CATHETERIZATION WITH CORONARY ANGIOGRAM;  Surgeon: Pamella Pert, MD;  Location: [facility] CATH LAB;  Service: Cardiovascular;  Laterality: N/A;   LEFT HEART CATHETERIZATION WITH CORONARY ANGIOGRAM N/A 04/08/2014   Procedure: LEFT HEART CATHETERIZATION WITH CORONARY ANGIOGRAM;  Surgeon: Micheline Chapman, MD;  Location: [facility] CATH LAB;  Service:  Cardiovascular;  Laterality: N/A;   PERCUTANEOUS CORONARY STENT INTERVENTION (PCI-S) N/A 05/03/2014   Procedure: PERCUTANEOUS CORONARY STENT INTERVENTION (PCI-S);  Surgeon: Pamella Pert, MD;  Location: [facility] CATH LAB;  Service: Cardiovascular;  Laterality: N/A;   RADIOLOGY WITH ANESTHESIA N/A 02/08/2021   Procedure: MRI WITH ANESTHESIA OF ABDOMEN WITH AND WITHOUT CONSTRAST;  Surgeon: Radiologist, Medication, MD;  Location: [facility] OR;  Service: Radiology;  Laterality: N/A;   ROTATOR CUFF REPAIR     UMBILICAL HERNIA REPAIR  yrs ago   Social History:   reports that he has never smoked. He has never used smokeless tobacco. He reports that he does not drink alcohol and does not use drugs.  Family History  Adopted: Yes  Problem Relation Age of Onset   Emphysema Mother     Medications: Patient's Medications  New Prescriptions   No medications on file  Previous Medications   ACETAMINOPHEN (TYLENOL) 500 MG TABLET    Take 500 mg by mouth every 6 (six) hours as needed for mild pain, moderate pain, headache or  fever.   ALBUTEROL (VENTOLIN HFA) 108 (90 BASE) MCG/ACT INHALER    Inhale 2 puffs into the lungs every 6 (six) hours as needed for wheezing or shortness of breath.   AMLODIPINE (NORVASC) 5 MG TABLET    Take 1 tablet (5 mg total) by mouth daily. Can take an additional 5 mg of amlodipine for blood pressure >160 mmHg stystolic   ASPIRIN EC 81 MG TABLET    Take 1 tablet (81 mg total) by mouth daily. Swallow whole.   BD PEN NEEDLE NANO 2ND GEN 32G X 4 MM MISC    USE AS DIRECTED   CHOLECALCIFEROL (D-5000) 125 MCG (5000 UT) TABS    Take 1 tablet (5,000 Units total) by mouth daily. E55.9   CLOPIDOGREL (PLAVIX) 75 MG TABLET    Take 1 tablet (75 mg total) by mouth daily.   CONTINUOUS BLOOD GLUC RECEIVER (FREESTYLE LIBRE 14 DAY READER) DEVI    Inject 1 Device as directed daily as needed. Use once daily as direct to check blood sugar E11.51   CONTINUOUS BLOOD GLUC SENSOR (FREESTYLE LIBRE 14 DAY SENSOR) MISC    Use to test blood sugar three times daily. E11.51. E11.59   FLUTICASONE (FLONASE) 50 MCG/ACT NASAL SPRAY    Place 1 spray into both nostrils daily as needed for allergies or rhinitis.   GABAPENTIN (NEURONTIN) 100 MG CAPSULE    TAKE 2 CAPSULES BY MOUTH AT BEDTIME.   GLUCOSE 4 GM CHEWABLE TABLET    Chew 1 tablet (4 g total) by mouth as needed for low blood sugar.   GUAIFENESIN (MUCINEX) 600 MG 12 HR TABLET    Take 600 mg by mouth 2 (two) times daily as needed (congestion/bronchial issues.).   INSULIN ASPART (NOVOLOG FLEXPEN) 100 UNIT/ML FLEXPEN    Inject 25-30 Units into the skin See admin instructions. Inject 30 units subcutaneously in the morning, inject 25 units subcutaneously with lunch & inject 25 units subcutaneously with dinner.   INSULIN GLARGINE (BASAGLAR KWIKPEN) 100 UNIT/ML    Inject 30 Units into the skin at bedtime.   INSULIN LISPRO (HUMALOG KWIKPEN) 100 UNIT/ML KWIKPEN    3 times a day (just before each meal) 30-25-25 units, and pen needles 4/day   ISOSORBIDE MONONITRATE (IMDUR) 60 MG 24 HR  TABLET    Take 1 tablet (60 mg total) by mouth daily.   LIFESCAN FINEPOINT LANCETS MISC    Use to test for blood sugar  three times daily dx: E11.51   METOPROLOL SUCCINATE (TOPROL-XL) 25 MG 24 HR TABLET    Take 1 tablet (25 mg total) by mouth daily. TAKE WITH OR IMMEDIATELY FOLLOWING A MEAL.   NITROGLYCERIN (NITROSTAT) 0.4 MG SL TABLET    PLACE 1 TABLET UNDER THE TONGUE EVERY 5 MINUTES AS NEEDED FOR CHEST PAIN.   ONETOUCH ULTRA TEST STRIP    USE TO TEST FOR BLOOD SUGAR THREE TIMES DAILY DX: E11.51   PANTOPRAZOLE (PROTONIX) 40 MG TABLET    TAKE ONE TABLET BY MOUTH ONCE DAILY FOR STOMACH   PRALUENT 150 MG/ML SOAJ    INJECT 1 PEN INTO THE SKIN EVERY 14 (FOURTEEN) DAYS.   PRAMIPEXOLE (MIRAPEX) 1 MG TABLET    TAKE 1 TABLET BY MOUTH AT 5PM, 1 TABLET BY MOUTH AT 7PM, AND TAKE 2 TABLETS BY MOUTH AT BEDTIME.   VALSARTAN (DIOVAN) 160 MG TABLET    Take 1 tablet (160 mg total) by mouth every evening.   VALSARTAN (DIOVAN) 80 MG TABLET    Take 80 mg by mouth every evening.  Modified Medications   No medications on file  Discontinued Medications   No medications on file    Physical Exam:  Vitals:   07/02/21 1439  BP: 130/80  Pulse: 65  Temp: 97.7 F (36.5 C)  SpO2: 97%   There is no height or weight on file to calculate BMI. Wt Readings from Last 3 Encounters:  05/31/21 207 lb (93.9 kg)  05/28/21 206 lb (93.4 kg)  05/16/21 210 lb (95.3 kg)    Physical Exam Constitutional:      General: He is not in acute distress.    Appearance: He is well-developed. He is not diaphoretic.  HENT:     Head: Normocephalic and atraumatic.     Right Ear: External ear normal.     Left Ear: External ear normal.     Nose: Congestion present. No rhinorrhea.     Mouth/Throat:     Pharynx: No oropharyngeal exudate.  Eyes:     Conjunctiva/sclera: Conjunctivae normal.     Pupils: Pupils are equal, round, and reactive to light.  Cardiovascular:     Rate and Rhythm: Normal rate and regular rhythm.     Heart  sounds: Normal heart sounds.  Pulmonary:     Effort: Pulmonary effort is normal.     Breath sounds: Normal breath sounds.  Abdominal:     General: Bowel sounds are normal.     Palpations: Abdomen is soft.  Musculoskeletal:        General: No tenderness.     Cervical back: Normal range of motion and neck supple.     Right lower leg: No edema.     Left lower leg: No edema.  Skin:    General: Skin is warm and dry.  Neurological:     Mental Status: He is alert and oriented to person, place, and time.    Labs reviewed: Basic Metabolic Panel:  Liver Function Tests: Recent Labs    02/09/21 0240 02/10/21 1026 02/11/21 1137 02/21/21 0912  AST 74* 52* 53* 22  ALT 194* 151* 129* 37  ALKPHOS 84 87 101  --   BILITOT 1.9* 1.8* 1.4* 0.6  PROT 5.6* 6.4* 6.7 6.3  ALBUMIN 2.9* 3.2* 3.5  --    Recent Labs    02/06/21 1430 02/07/21 1430  LIPASE 24 26  AMYLASE  --  36   No results for input(s): AMMONIA in the last 8760 hours.  CBC: Recent Labs    01/14/21 1146 01/14/21 1152 02/07/21 0826 02/08/21 0225 02/10/21 1026 02/10/21 1157 02/21/21 0912 04/25/21 0949  WBC 5.6   < > 9.2   < > 5.3  --  5.8 5.3  NEUTROABS 3.9  --  8.4*  --   --   --  4,014  --   HGB 14.2   < > 14.3   < > 14.0  --  13.3 14.5  HCT 42.5   < > 42.3   < > 41.5  --  39.3 43.1  MCV 88.0   < > 88.9   < > 88.3  --  87.9 87  PLT 131*   < > 102*   < > 128* 121* 182 137*   < > = values in this interval not displayed.   Lipid Panel: Recent Labs    01/15/21 0811  CHOL 81  HDL 31*  LDLCALC 15  TRIG 578*  CHOLHDL 2.6   TSH: Recent Labs    01/15/21 0811  TSH 2.684   A1C: Lab Results  Component Value Date   HGBA1C 8.3 (A) 05/07/2021     Assessment/Plan 1. Viral URI Supportive care at this time Okay to continue benzonatate for cough but to also use mucinex dm for the mucolytic Increase water intake Mucinex DM BID  Albuterol PRN cough, wheezing   2. Restless legs syndrome (RLS) -ongoing,  worsening symptoms. He did a virtual consultation but was not what he was hoping for.  -currently on pramipexole but has to take several at night for symptom relief and makes him very fatigue even into the next day.  He was previously on Sinemet by neurology but did not continue -he has not had recent ferritin levels may benefit from this being rechecked.   Return precautions given.  Janene Harvey. Biagio Borg  [facility] [phone]

## 2021-07-02 NOTE — Telephone Encounter (Signed)
Completed health history. Patient reports that he may have a bronchitis and has an appointment to follow up with his primary care provider this afternoon. Saw the note that Mr Dendinger has taken a recent Covid test and that it was negative. Will hold off on patient coming to orientation tomorrow and follow up later in the week. Patient is agreeable to the plan.Gladstone Lighter, RN,BSN ?07/02/2021 2:20 PM  ?

## 2021-07-03 ENCOUNTER — Inpatient Hospital Stay (HOSPITAL_COMMUNITY): Admission: RE | Admit: 2021-07-03 | Payer: Medicare Other | Source: Ambulatory Visit

## 2021-07-03 ENCOUNTER — Encounter: Payer: Self-pay | Admitting: Nurse Practitioner

## 2021-07-03 DIAGNOSIS — G2581 Restless legs syndrome: Secondary | ICD-10-CM

## 2021-07-03 NOTE — Telephone Encounter (Signed)
See if he can come in Thursday for visit  ?

## 2021-07-04 NOTE — Telephone Encounter (Signed)
Routed to Jessica.

## 2021-07-06 ENCOUNTER — Encounter: Payer: Self-pay | Admitting: Student

## 2021-07-06 ENCOUNTER — Other Ambulatory Visit: Payer: Self-pay | Admitting: Endocrinology

## 2021-07-06 ENCOUNTER — Ambulatory Visit: Payer: Medicare Other | Admitting: Student

## 2021-07-06 VITALS — BP 151/73 | HR 71 | Temp 97.6°F | Resp 17 | Ht 68.0 in | Wt 213.0 lb

## 2021-07-06 DIAGNOSIS — R0609 Other forms of dyspnea: Secondary | ICD-10-CM | POA: Diagnosis not present

## 2021-07-06 DIAGNOSIS — I10 Essential (primary) hypertension: Secondary | ICD-10-CM

## 2021-07-06 DIAGNOSIS — R600 Localized edema: Secondary | ICD-10-CM | POA: Diagnosis not present

## 2021-07-06 MED ORDER — FUROSEMIDE 40 MG PO TABS: 40.0000 mg | ORAL_TABLET | Freq: Every day | ORAL | 3 refills | Status: DC | PRN

## 2021-07-06 MED ORDER — POTASSIUM CHLORIDE CRYS ER 20 MEQ PO TBCR: 20.0000 meq | EXTENDED_RELEASE_TABLET | Freq: Every day | ORAL | 3 refills | Status: DC | PRN

## 2021-07-06 NOTE — Progress Notes (Signed)
? ?Primary Physician/Referring:  [name], NP ? ?Patient ID: Kenneth Guggenheim Sr., male    DOB: 26-Jul-1950, 71 y.o.   MRN: [id number] ? ?Chief Complaint  ?Patient presents with  ? Follow-up  ? medication managment  ? swelling  ? ?HPI:   ? ?Kenneth Ricardo Sr.  is a 70 y.o. caucasian male with coronary artery disease, last cardiac catheterization on 05/29/2017 when he presented with unstable anginal-like symptoms revealing widely patent stent to his LAD, RCA and circumflex and also balloon angioplasty site to D1 and distal LAD. Fortunately his last PCI has been in 2016.  Past medical history significant for hypertension, hyperlipidemia, GERD, Barrett's esophagus, statin intolerance, uncontrolled diabetes mellitus and also severe restless leg syndrome. ? ?Patient underwent left heart catheterization 04/23/2021 with successful PCI to RCA. He now presents with complaints of bilateral leg edema worsening over the last 3 weeks.  He has had no recurrence of chest pain.  He reports mild improvement of dyspnea since PCI. ? ?Past Medical History:  ?Diagnosis Date  ? Arthritis   ? "left thumb" (05/23/2017) right shoulder  ? Barrett's esophagus   ? "we've been told that it's all gone; still take RX for GERD" (05/23/2017)  ? Bilateral swelling of feet and ankles x 3 weeks as of 12-01-2019  ? CAD (coronary artery disease), native coronary artery   ? Coronary angiogram 05/03/2014:  Proximal LAD 3.0x12 mm Promus premier stent.  04/08/2014: Mid Cx 3.5 x 16 mm Promus DES.  03/29/2013: Mid LAD 2.75 x 38 mm Promus Premier drug-eluting stent, balloon angioplasty of D1 and distal LAD and stenting of distal RCA with 2.75 x 24 mm promos Premier drug-eluting stent 12/15/2012 widely patent.  ? Cervicalgia   ? Chronic bronchitis (HCC)   ? "q yr" (05/23/2017)  ? Complication of anesthesia   ? " I do not wake up very well "  slow to wake up  ? COVID-19 05/2019  ? all covid symptoms in hospital x 5 days, all symptoms resolved took monoclonal antibody tx   ?  Dyspnea   ? with exertion  ? Family history of adverse reaction to anesthesia   ? "son w/PONV"  ? GERD (gastroesophageal reflux disease)   ? Heart murmur   ? "grew out of it"   ? Hyperlipidemia   ? Hypertension   ? Hypoglycemia, unspecified   ? IDDM (insulin dependent diabetes mellitus)   ? type 2  ? Impotence of organic origin   ? Iron deficiency anemia   ? Memory loss   ? mild  ? Other malaise and fatigue   ? Pneumonia 05/2019  ? PONV (postoperative nausea and vomiting)   ? after wisdom teeth pulled, no problems with other surgeries  ? Restless leg   ? Rotator cuff arthropathy   ? Stroke [facility])   ? Tension headache   ? "sometimes" (05/23/2017)  ? Unspecified gastritis and gastroduodenitis with hemorrhage   ? Unstable angina pectoris (HCC) 04/08/2014  ? Coronary angiogram 05/03/2014:  Proximal LAD 3.0x12 mm Promus premier stent.  04/08/2014: Mid Cx 3.5 x 16 mm Promus DES.  03/29/2013: Mid LAD 2.75 x 38 mm Promus Premier drug-eluting stent, balloon angioplasty of D1 and distal LAD and stenting of distal RCA with 2.75 x 24 mm promos Premier drug-eluting stent 12/15/2012 widely patent.  ? ?Past Surgical History:  ?Procedure Laterality Date  ? BICEPT TENODESIS Right 12/07/2019  ? Procedure: RIGHT BICEPS TENODESIS;  Surgeon: Sheral Apley, MD;  Location: [facility];  Service: Orthopedics;  Laterality: Right;  ? CARDIAC CATHETERIZATION N/A 09/25/2015  ? Procedure: Left Heart Cath and Coronary Angiography;  Surgeon: [name], MD;  Location: [place] INVASIVE CV LAB;  Service: Cardiovascular;  Laterality: N/A;  ? CARDIAC CATHETERIZATION N/A 09/25/2015  ? Procedure: Intravascular Pressure Wire/FFR Study;  Surgeon: [name], MD;  Location: [facility] INVASIVE CV LAB;  Service: Cardiovascular;  Laterality: N/A;  ? CORONARY ANGIOPLASTY WITH STENT PLACEMENT  12/15/2012; 04/28/2014; 05/03/2014  ? "2; 1; 1" , 4 stents 2 balloons  ? CORONARY STENT INTERVENTION N/A 05/01/2021  ? Procedure: CORONARY STENT INTERVENTION;  Surgeon: [name], MD;  Location: [facility] INVASIVE CV LAB;  Service: Cardiovascular;  Laterality: N/A;  ? FRACTIONAL FLOW RESERVE WIRE Right 03/26/2013  ? Procedure: FRACTIONAL FLOW RESERVE WIRE;  Surgeon: Pamella Pert, MD;  Location: [facility] CATH LAB;  Service: Cardiovascular;  Laterality: Right;  ? FRACTIONAL FLOW RESERVE WIRE N/A 05/03/2014  ? Procedure: FRACTIONAL FLOW RESERVE WIRE;  Surgeon: Pamella Pert, MD;  Location: [facility] CATH LAB;  Service: Cardiovascular;  Laterality: N/A;  ? HERNIA REPAIR  2010  ? "umbilical"   ? INCISION AND DRAINAGE ABSCESS Left 05/2007  ? "groin"   ? KNEE SURGERY Right 1992;  2000  ? "calcium deposits removed" (12/15/2012)  ? LEFT HEART CATH AND CORONARY ANGIOGRAPHY N/A 05/23/2017  ? Procedure: LEFT HEART CATH AND CORONARY ANGIOGRAPHY;  Surgeon: [name], MD;  Location: [facility] INVASIVE CV LAB;  Service: Cardiovascular;  Laterality: N/A;  ? LEFT HEART CATH AND CORONARY ANGIOGRAPHY N/A 05/01/2021  ? Procedure: LEFT HEART CATH AND CORONARY ANGIOGRAPHY;  Surgeon: [name], MD;  Location: [facility] INVASIVE CV LAB;  Service: Cardiovascular;  Laterality: N/A;  ? LEFT HEART CATHETERIZATION WITH CORONARY ANGIOGRAM N/A 12/15/2012  ? Procedure: LEFT HEART CATHETERIZATION WITH CORONARY ANGIOGRAM;  Surgeon: Pamella Pert, MD;  Location: [facility] CATH LAB;  Service: Cardiovascular;  Laterality: N/A;  ? LEFT HEART CATHETERIZATION WITH CORONARY ANGIOGRAM N/A 03/26/2013  ? Procedure: LEFT HEART CATHETERIZATION WITH CORONARY ANGIOGRAM;  Surgeon: Pamella Pert, MD;  Location: [facility] CATH LAB;  Service: Cardiovascular;  Laterality: N/A;  ? LEFT HEART CATHETERIZATION WITH CORONARY ANGIOGRAM N/A 04/08/2014  ? Procedure: LEFT HEART CATHETERIZATION WITH CORONARY ANGIOGRAM;  Surgeon: Micheline Chapman, MD;  Location: [facility] CATH LAB;  Service: Cardiovascular;  Laterality: N/A;  ? PERCUTANEOUS CORONARY STENT INTERVENTION (PCI-S) N/A 05/03/2014  ? Procedure: PERCUTANEOUS CORONARY STENT INTERVENTION (PCI-S);  Surgeon: Pamella Pert, MD;  Location:  [facility] CATH LAB;  Service: Cardiovascular;  Laterality: N/A;  ? RADIOLOGY WITH ANESTHESIA N/A 02/08/2021  ? Procedure: MRI WITH ANESTHESIA OF ABDOMEN WITH AND WITHOUT CONSTRAST;  Surgeon: Radiologist, Medication, MD;  Location: [facility] OR;  Service: Radiology;  Laterality: N/A;  ? ROTATOR CUFF REPAIR    ? UMBILICAL HERNIA REPAIR  yrs ago  ? ?Family History  ?Adopted: Yes  ?Problem Relation Age of Onset  ? Emphysema Mother   ? ?Social History  ? ?Tobacco Use  ? Smoking status: Never  ? Smokeless tobacco: Never  ?Substance Use Topics  ? Alcohol use: No  ?  ? ?ROS  ?Review of Systems  ?Cardiovascular:  Positive for claudication (stable), dyspnea on exertion (mild) and leg swelling (bilateral). Negative for chest pain (resolved), near-syncope, orthopnea, palpitations, paroxysmal nocturnal dyspnea and syncope.  ?Respiratory:  Negative for shortness of breath.   ?Neurological:  Negative for dizziness.  ? ?Objective  ? ? ?  07/06/2021  ?  1:43 PM 07/06/2021  ?  1:35 PM 07/02/2021  ?  2:39 PM  ?Vitals with BMI  ?Height  5\' 8"    ?Weight  213 lbs   ?BMI  32.39   ?Systolic 151 157 [id number]  ?Diastolic 73 83 80  ?Pulse 71 71 65  ?  ?Blood pressure (!) 151/73, pulse 71, temperature 97.6 ?F (36.4 ?C), temperature source Temporal, resp. rate 17, height 5\' 8"  (1.727 m), weight 213 lb (96.6 kg), SpO2 96 %. Body mass index is 32.39 kg/m?. ?  ?Physical Exam ?Vitals reviewed.  ?Constitutional:   ?   Appearance: He is well-developed.  ?Neck:  ?   Vascular: No carotid bruit.  ?Cardiovascular:  ?   Rate and Rhythm: Normal rate and regular rhythm.  ?   Pulses:     ?     Carotid pulses are 2+ on the right side and 2+ on the left side. ?     Femoral pulses are 2+ on the right side and 2+ on the left side. ?     Dorsalis pedis pulses are 0 on the right side and 0 on the left side.  ?     Posterior tibial pulses are 1+ on the right side and 0 on the left side.  ?   Heart sounds: Murmur heard.  ?Blowing decrescendo early diastolic murmur is present at the upper  right sternal border radiating to the apex.  ?  No gallop.  ?   Comments: No JVD. ?Pulmonary:  ?   Effort: Pulmonary effort is normal.  ?   Breath sounds: Normal breath sounds.  ?Musculoskeletal:  ?   [place]

## 2021-07-06 NOTE — Telephone Encounter (Signed)
Called and spoke to patient he was transferred to front desk to get scheduled

## 2021-07-07 ENCOUNTER — Other Ambulatory Visit: Payer: Self-pay | Admitting: Student

## 2021-07-07 LAB — BASIC METABOLIC PANEL
BUN/Creatinine Ratio: 14 (ref 10–24)
BUN: 20 mg/dL (ref 8–27)
CO2: 23 mmol/L (ref 20–29)
Calcium: 9.9 mg/dL (ref 8.6–10.2)
Chloride: 102 mmol/L (ref 96–106)
Creatinine, Ser: 1.38 mg/dL — ABNORMAL HIGH (ref 0.76–1.27)
Glucose: 130 mg/dL — ABNORMAL HIGH (ref 70–99)
Potassium: 5.1 mmol/L (ref 3.5–5.2)
Sodium: 140 mmol/L (ref 134–144)
eGFR: 55 mL/min/{1.73_m2} — ABNORMAL LOW (ref >59)

## 2021-07-07 LAB — BRAIN NATRIURETIC PEPTIDE: BNP: 51.4 pg/mL (ref 0.0–100.0)

## 2021-07-09 ENCOUNTER — Ambulatory Visit (HOSPITAL_COMMUNITY): Payer: Medicare Other

## 2021-07-10 ENCOUNTER — Telehealth (HOSPITAL_COMMUNITY): Payer: Self-pay

## 2021-07-10 NOTE — Progress Notes (Signed)
Called pt to inform him about is lab results. Pt understood, and mention that he is still swollen but better then the last time. He has been wearing compression stockings. Pt mention they were so tight that it hurt him more then help him

## 2021-07-10 NOTE — Telephone Encounter (Signed)
Patient called and was interested in participating in the Cardiac Rehab Program. Patient will come in for orientation on 07/12/2021@8 :00am and will attend the 7:00am exercise class. ?

## 2021-07-11 ENCOUNTER — Telehealth (HOSPITAL_COMMUNITY): Payer: Self-pay

## 2021-07-11 ENCOUNTER — Ambulatory Visit (HOSPITAL_COMMUNITY): Payer: Medicare Other

## 2021-07-11 ENCOUNTER — Ambulatory Visit: Payer: Medicare Other

## 2021-07-11 DIAGNOSIS — R600 Localized edema: Secondary | ICD-10-CM | POA: Diagnosis not present

## 2021-07-11 DIAGNOSIS — R0609 Other forms of dyspnea: Secondary | ICD-10-CM

## 2021-07-12 ENCOUNTER — Encounter (HOSPITAL_COMMUNITY)
Admission: RE | Admit: 2021-07-12 | Discharge: 2021-07-12 | Disposition: A | Discharge status: Home / Self Care | Payer: Medicare Other | Source: Ambulatory Visit | Attending: Cardiology | Admitting: Cardiology

## 2021-07-12 ENCOUNTER — Encounter: Payer: Self-pay | Admitting: Neurology

## 2021-07-12 ENCOUNTER — Encounter (HOSPITAL_COMMUNITY): Payer: Self-pay

## 2021-07-12 VITALS — BP 124/70 | HR 74 | Ht 68.25 in | Wt 209.4 lb

## 2021-07-12 DIAGNOSIS — Z794 Long term (current) use of insulin: Secondary | ICD-10-CM | POA: Insufficient documentation

## 2021-07-12 DIAGNOSIS — Z7982 Long term (current) use of aspirin: Secondary | ICD-10-CM | POA: Insufficient documentation

## 2021-07-12 DIAGNOSIS — Z955 Presence of coronary angioplasty implant and graft: Secondary | ICD-10-CM | POA: Insufficient documentation

## 2021-07-12 DIAGNOSIS — Z79899 Other long term (current) drug therapy: Secondary | ICD-10-CM | POA: Insufficient documentation

## 2021-07-12 DIAGNOSIS — Z48812 Encounter for surgical aftercare following surgery on the circulatory system: Secondary | ICD-10-CM | POA: Insufficient documentation

## 2021-07-12 NOTE — Progress Notes (Signed)
Advise patient to stop amlodipine as it may be causing swelling. Have him increase metoprolol from 25 mg to 50 mg to help control BP since he will be stopping amlodipine.

## 2021-07-12 NOTE — Progress Notes (Signed)
Cardiac Rehab Medication Review by a Nurse ? ?Does the patient  feel that his/her medications are working for him/her?  yes ? ?Has the patient been experiencing any side effects to the medications prescribed?  no ? ?Does the patient measure his/her own blood pressure or blood glucose at home?  yes  ? ?Does the patient have any problems obtaining medications due to transportation or finances?   no ? ?Understanding of regimen: good ?Understanding of indications: fair ?Potential of compliance: fair ? ? ? ?Nurse  comments: Jasman is taking his medications as prescribed and has a fair understanding of what his medications are for. Laquan has a CGM. Ruslan as a BP cuff monitor which he checks once a week. ? ? ? ?Thayer Headings RN ?07/12/2021 8:27 AM ?  ?

## 2021-07-12 NOTE — Progress Notes (Addendum)
Cardiac Individual Treatment Plan ? ?Patient Details  ?Name: Kenneth Waldrop Sr. ?MRN: [id number] ?Date of Birth: Feb 17, 1951 ?Referring Provider:   ?Flowsheet Row CARDIAC REHAB PHASE II ORIENTATION from 07/12/2021 in [place]  ?Referring Provider Dr. Adrian Prows MD  ? ?  ? ? ?Initial Encounter Date:  ?Flowsheet Row CARDIAC REHAB PHASE II ORIENTATION from 07/12/2021 in [place]  ?Date 07/12/21  ? ?  ? ? ?Visit Diagnosis: 05/01/21 S/ P DES RCA ? ?Patient's Home Medications on Admission: ? ?Current Outpatient Medications:  ?  acetaminophen (TYLENOL) 500 MG tablet, Take 500 mg by mouth every 6 (six) hours as needed for mild pain, moderate pain, headache or fever., Disp: , Rfl:  ?  albuterol (VENTOLIN HFA) 108 (90 Base) MCG/ACT inhaler, Inhale 2 puffs into the lungs every 6 (six) hours as needed for wheezing or shortness of breath., Disp: 18 g, Rfl: 1 ?  amLODipine (NORVASC) 5 MG tablet, Take 1 tablet (5 mg total) by mouth daily. Can take an additional 5 mg of amlodipine for blood pressure [id number] mmHg stystolic (Patient taking differently: Take 5 mg by mouth every evening. Can take an additional 5 mg of amlodipine for blood pressure [id number] mmHg stystolic), Disp: 90 tablet, Rfl: 1 ?  aspirin EC 81 MG tablet, Take 1 tablet (81 mg total) by mouth daily. Swallow whole. (Patient taking differently: Take 81 mg by mouth every evening. Swallow whole.), Disp: 90 tablet, Rfl: 3 ?  BD PEN NEEDLE NANO 2ND GEN 32G X 4 MM MISC, USE AS DIRECTED, Disp: 100 each, Rfl: 3 ?  benzonatate (TESSALON) 100 MG capsule, Take 1 capsule (100 mg total) by mouth 2 (two) times daily as needed for cough., Disp: 20 capsule, Rfl: 0 ?  Cholecalciferol (D-5000) 125 MCG (5000 UT) TABS, Take 1 tablet (5,000 Units total) by mouth daily. E55.9 (Patient not taking: Reported on 07/06/2021), Disp: 90 tablet, Rfl: 3 ?  clopidogrel (PLAVIX) 75 MG tablet, Take 1 tablet (75 mg total) by mouth daily. (Patient taking  differently: Take 75 mg by mouth every evening.), Disp: 90 tablet, Rfl: 3 ?  Continuous Blood Gluc Receiver (FREESTYLE LIBRE 14 DAY READER) DEVI, Inject 1 Device as directed daily as needed. Use once daily as direct to check blood sugar E11.51, Disp: 1 each, Rfl: 0 ?  Continuous Blood Gluc Sensor (FREESTYLE LIBRE 14 DAY SENSOR) MISC, Use to test blood sugar three times daily. E11.51. E11.59, Disp: 2 each, Rfl: 12 ?  fluticasone (FLONASE) 50 MCG/ACT nasal spray, Place 1 spray into both nostrils daily as needed for allergies or rhinitis., Disp: , Rfl:  ?  furosemide (LASIX) 40 MG tablet, Take 1 tablet (40 mg total) by mouth daily as needed for fluid or edema., Disp: 90 tablet, Rfl: 3 ?  gabapentin (NEURONTIN) 100 MG capsule, TAKE 2 CAPSULES BY MOUTH AT BEDTIME., Disp: 180 capsule, Rfl: 1 ?  glucose 4 GM chewable tablet, Chew 1 tablet (4 g total) by mouth as needed for low blood sugar., Disp: 50 tablet, Rfl: 12 ?  guaiFENesin (MUCINEX) 600 MG 12 hr tablet, Take 600 mg by mouth 2 (two) times daily as needed (congestion/bronchial issues.)., Disp: , Rfl:  ?  insulin aspart (NOVOLOG FLEXPEN) 100 UNIT/ML FlexPen, Inject 25-30 Units into the skin See admin instructions. Inject 30 units subcutaneously in the morning, inject 25 units subcutaneously with lunch & inject 25 units subcutaneously with dinner., Disp: , Rfl:  ?  Insulin Glargine (BASAGLAR KWIKPEN) 100  UNIT/ML, Inject 30 Units into the skin at bedtime., Disp: 30 mL, Rfl: 3 ?  insulin lispro (HUMALOG KWIKPEN) 100 UNIT/ML KwikPen, 3 times a day (just before each meal) 30-25-25 units, and pen needles 4/day, Disp: 90 mL, Rfl: 3 ?  isosorbide mononitrate (IMDUR) 60 MG 24 hr tablet, TAKE 1 TABLET BY MOUTH EVERY DAY, Disp: 90 tablet, Rfl: 1 ?  LIFESCAN FINEPOINT LANCETS MISC, Use to test for blood sugar three times daily dx: E11.51, Disp: 300 each, Rfl: 1 ?  metoprolol succinate (TOPROL-XL) 25 MG 24 hr tablet, Take 1 tablet (25 mg total) by mouth daily. TAKE WITH OR  IMMEDIATELY FOLLOWING A MEAL. (Patient taking differently: Take 25 mg by mouth every evening. TAKE WITH OR IMMEDIATELY FOLLOWING A MEAL.), Disp: 90 tablet, Rfl: 1 ?  nitroGLYCERIN (NITROSTAT) 0.4 MG SL tablet, PLACE 1 TABLET UNDER THE TONGUE EVERY 5 MINUTES AS NEEDED FOR CHEST PAIN., Disp: 50 tablet, Rfl: 0 ?  ONETOUCH ULTRA test strip, USE TO TEST FOR BLOOD SUGAR THREE TIMES DAILY DX: E11.51, Disp: 300 strip, Rfl: 11 ?  pantoprazole (PROTONIX) 40 MG tablet, TAKE ONE TABLET BY MOUTH ONCE DAILY FOR STOMACH (Patient taking differently: 40 mg every evening. Take one tablet by mouth once daily for stomach), Disp: 90 tablet, Rfl: 1 ?  potassium chloride SA (KLOR-CON M) 20 MEQ tablet, Take 1 tablet (20 mEq total) by mouth daily as needed (with Lasix)., Disp: 90 tablet, Rfl: 3 ?  PRALUENT 150 MG/ML SOAJ, INJECT 1 PEN INTO THE SKIN EVERY 14 (FOURTEEN) DAYS., Disp: 6 mL, Rfl: 1 ?  pramipexole (MIRAPEX) 1 MG tablet, TAKE 1 TABLET BY MOUTH AT 5PM, 1 TABLET BY MOUTH AT 7PM, AND TAKE 2 TABLETS BY MOUTH AT BEDTIME., Disp: 360 tablet, Rfl: 1 ?  valsartan (DIOVAN) 160 MG tablet, Take 1 tablet (160 mg total) by mouth every evening. (Patient not taking: Reported on 07/06/2021), Disp: 90 tablet, Rfl: 3 ?  valsartan (DIOVAN) 80 MG tablet, Take 80 mg by mouth every evening., Disp: , Rfl:  ?No current facility-administered medications for this encounter. ? ?Facility-Administered Medications Ordered in Other Encounters:  ?  [COMPLETED] hydrALAZINE (APRESOLINE) injection 10 mg, 10 mg, Intravenous, Once, Adrian Prows, MD, 10 mg at 05/01/21 1054 ? ?Past Medical History: ?Past Medical History:  ?Diagnosis Date  ? Arthritis   ? "left thumb" (05/23/2017) right shoulder  ? Barrett's esophagus   ? "we've been told that it's all gone; still take RX for GERD" (05/23/2017)  ? Bilateral swelling of feet and ankles x 3 weeks as of 12-01-2019  ? CAD (coronary artery disease), native coronary artery   ? Coronary angiogram 05/03/2014:  Proximal LAD 3.0x12 mm  Promus premier stent.  04/08/2014: Mid Cx 3.5 x 16 mm Promus DES.  03/29/2013: Mid LAD 2.75 x 38 mm Promus Premier drug-eluting stent, balloon angioplasty of D1 and distal LAD and stenting of distal RCA with 2.75 x 24 mm promos Premier drug-eluting stent 12/15/2012 widely patent.  ? Cervicalgia   ? Chronic bronchitis ([place])   ? "q yr" (05/23/2017)  ? Complication of anesthesia   ? " I do not wake up very well "  slow to wake up  ? COVID-19 05/2019  ? all covid symptoms in hospital x 5 days, all symptoms resolved took monoclonal antibody tx   ? Dyspnea   ? with exertion  ? Family history of adverse reaction to anesthesia   ? "son w/PONV"  ? GERD (gastroesophageal reflux disease)   ? Heart  murmur   ? "grew out of it"   ? Hyperlipidemia   ? Hypertension   ? Hypoglycemia, unspecified   ? IDDM (insulin dependent diabetes mellitus)   ? type 2  ? Impotence of organic origin   ? Iron deficiency anemia   ? Memory loss   ? mild  ? Other malaise and fatigue   ? Pneumonia 05/2019  ? PONV (postoperative nausea and vomiting)   ? after wisdom teeth pulled, no problems with other surgeries  ? Restless leg   ? Rotator cuff arthropathy   ? Stroke [facility])   ? Tension headache   ? "sometimes" (05/23/2017)  ? Unspecified gastritis and gastroduodenitis with hemorrhage   ? Unstable angina pectoris ([place]) 04/08/2014  ? Coronary angiogram 05/03/2014:  Proximal LAD 3.0x12 mm Promus premier stent.  04/08/2014: Mid Cx 3.5 x 16 mm Promus DES.  03/29/2013: Mid LAD 2.75 x 38 mm Promus Premier drug-eluting stent, balloon angioplasty of D1 and distal LAD and stenting of distal RCA with 2.75 x 24 mm promos Premier drug-eluting stent 12/15/2012 widely patent.  ? ? ?Tobacco Use: ?Social History  ? ?Tobacco Use  ?Smoking Status Never  ?Smokeless Tobacco Never  ? ? ?Labs: ?Review Flowsheet   ? ?  ?  Latest Ref Rng & Units 09/14/2020 12/25/2020 01/14/2021 01/15/2021  ?Labs for ITP Cardiac and Pulmonary Rehab  ?Cholestrol 0 - 200 mg/dL    81    ?LDL (calc) 0 - 99 mg/dL     15    ?HDL-C >40 mg/dL    31    ?Trlycerides <150 mg/dL    174    ?Hemoglobin A1c 4.0 - 5.6 % 8.6   8.1    7.9    ?TCO2 22 - 32 mmol/L   24     ? ?  05/07/2021  ?Labs for ITP Cardiac and Pulmonary Rehab  ?Cholest

## 2021-07-13 ENCOUNTER — Ambulatory Visit (HOSPITAL_COMMUNITY): Payer: Medicare Other

## 2021-07-13 ENCOUNTER — Ambulatory Visit: Payer: Medicare Other | Admitting: Endocrinology

## 2021-07-16 ENCOUNTER — Ambulatory Visit (HOSPITAL_COMMUNITY): Payer: Medicare Other

## 2021-07-16 ENCOUNTER — Encounter (HOSPITAL_COMMUNITY)
Admission: RE | Admit: 2021-07-16 | Discharge: 2021-07-16 | Disposition: A | Discharge status: Home / Self Care | Payer: Medicare Other | Source: Ambulatory Visit | Attending: Cardiology | Admitting: Cardiology

## 2021-07-16 ENCOUNTER — Ambulatory Visit: Payer: Medicare Other | Admitting: Cardiology

## 2021-07-16 DIAGNOSIS — Z955 Presence of coronary angioplasty implant and graft: Secondary | ICD-10-CM | POA: Diagnosis not present

## 2021-07-16 DIAGNOSIS — Z79899 Other long term (current) drug therapy: Secondary | ICD-10-CM | POA: Diagnosis not present

## 2021-07-16 DIAGNOSIS — Z48812 Encounter for surgical aftercare following surgery on the circulatory system: Secondary | ICD-10-CM | POA: Diagnosis not present

## 2021-07-16 DIAGNOSIS — Z794 Long term (current) use of insulin: Secondary | ICD-10-CM | POA: Diagnosis not present

## 2021-07-16 DIAGNOSIS — Z7982 Long term (current) use of aspirin: Secondary | ICD-10-CM | POA: Diagnosis not present

## 2021-07-16 LAB — GLUCOSE, CAPILLARY
Glucose-Capillary: 248 mg/dL — ABNORMAL HIGH (ref 70–99)
Glucose-Capillary: 136 mg/dL — ABNORMAL HIGH (ref 70–99)

## 2021-07-16 NOTE — Progress Notes (Signed)
QUALITY OF LIFE SCORE REVIEW ? Pt completed Quality of Life survey as a participant in Cardiac Rehab.  Scores 21.0 or below are considered low.  Overall 23.9 Health and Function 21.46, socioeconomic 25.31, physiological and spiritual 25.5, family 21. Pt denies any psychosocial barriers to participating in CR.  Pt has completed CR several years ago.  [name] feels supported in his efforts to adopt  a heart healthy lifestyle.  [name] admits that he hasn't always followed a low fat, salt diabetic diet but continues to make changes.  [name] biggest concern is feeling short of breath when he lies prone. According to progress note - pt completed follow up on 5/5 may be due to deconditioning and abdomina girth. Pt did take a short course of Lasix x 4 days. Will continue to monitor and intervene as necessary.  [name], BSN ?Cardiac and Pulmonary Rehab Nurse Navigator  ? ? ?

## 2021-07-16 NOTE — Progress Notes (Signed)
Daily Session Note ? ?Patient Details  ?Name: Kenneth Foss Sr. ?MRN: [id number] ?Date of Birth: 06-Nov-1950 ?Referring Provider:   ?Flowsheet Row CARDIAC REHAB PHASE II ORIENTATION from 07/12/2021 in [place]  ?Referring Provider Dr. Adrian Prows MD  ? ?  ? ? ?Encounter Date: 07/16/2021 ? ?Check In: ? Session Check In - 07/16/21 0701   ? ?  ? Check-In  ? Supervising physician immediately available to respond to emergencies [facility] immediately available   ? Physician(s) Dr Sloan Leiter   ? Location [facility]   ? Staff Present Esmeralda Links BS, ACSM EP-C, Exercise Physiologist;Josilynn Losh Wilber Oliphant, RN, Quentin Ore, MS, ACSM-CEP, Exercise Physiologist;Olinty Celesta Aver, MS, ACSM CEP, Exercise Physiologist   ? Virtual Visit No   ? Medication changes reported     No   ? Fall or balance concerns reported    No   ? Tobacco Cessation No Change   ? Current number of cigarettes/nicotine per day     0   ? Warm-up and Cool-down Performed as group-led instruction   ? Resistance Training Performed Yes   ? VAD Patient? No   ? PAD/SET Patient? No   ?  ? Pain Assessment  ? Currently in Pain? No/denies   ? Pain Score 0-No pain   ? Multiple Pain Sites No   ? ?  ?  ? ?  ? ? ?Capillary Blood Glucose: ?Results for orders placed or performed during the hospital encounter of 07/16/21 (from the past 24 hour(s))  ?Glucose, capillary     Status: Abnormal  ? Collection Time: 07/16/21  7:03 AM  ?Result Value Ref Range  ? Glucose-Capillary 248 (H) 70 - 99 mg/dL  ?Glucose, capillary     Status: Abnormal  ? Collection Time: 07/16/21  8:01 AM  ?Result Value Ref Range  ? Glucose-Capillary 136 (H) 70 - 99 mg/dL  ? ? ? ? ?Social History  ? ?Tobacco Use  ?Smoking Status Never  ?Smokeless Tobacco Never  ? ? ?Goals Met:  ?Exercise tolerated well ?Strength training completed today ? ?Goals Unmet:  ?Not Applicable ? ?Comments: Pt started cardiac rehab today.  Pt tolerated light exercise without difficulty. VSS,  telemetry-Sr, asymptomatic.  Medication list reconciled. Pt denies barriers to medication compliance.  PSYCHOSOCIAL ASSESSMENT:  PHQ-0. Pt exhibits positive coping skills, hopeful outlook with supportive family. No psychosocial needs identified at this time, no psychosocial interventions necessary. Pt oriented to exercise equipment and routine.  Understanding verbalized. Cherre Huger, BSN ?Cardiac and Pulmonary Rehab Nurse Navigator  ? ? ? ?Dr. Fransico Him is Medical Director for Cardiac Rehab at [facility] ?

## 2021-07-17 NOTE — Progress Notes (Signed)
Called pt and he is aware. 

## 2021-07-18 ENCOUNTER — Encounter (HOSPITAL_COMMUNITY)
Admission: RE | Admit: 2021-07-18 | Discharge: 2021-07-18 | Disposition: A | Discharge status: Home / Self Care | Payer: Medicare Other | Source: Ambulatory Visit | Attending: Cardiology | Admitting: Cardiology

## 2021-07-18 ENCOUNTER — Ambulatory Visit (HOSPITAL_COMMUNITY): Payer: Medicare Other

## 2021-07-18 DIAGNOSIS — Z794 Long term (current) use of insulin: Secondary | ICD-10-CM | POA: Diagnosis not present

## 2021-07-18 DIAGNOSIS — Z79899 Other long term (current) drug therapy: Secondary | ICD-10-CM | POA: Diagnosis not present

## 2021-07-18 DIAGNOSIS — Z955 Presence of coronary angioplasty implant and graft: Secondary | ICD-10-CM

## 2021-07-18 DIAGNOSIS — Z7982 Long term (current) use of aspirin: Secondary | ICD-10-CM | POA: Diagnosis not present

## 2021-07-18 DIAGNOSIS — Z48812 Encounter for surgical aftercare following surgery on the circulatory system: Secondary | ICD-10-CM | POA: Diagnosis not present

## 2021-07-18 LAB — GLUCOSE, CAPILLARY
Glucose-Capillary: 273 mg/dL — ABNORMAL HIGH (ref 70–99)
Glucose-Capillary: 155 mg/dL — ABNORMAL HIGH (ref 70–99)

## 2021-07-20 ENCOUNTER — Ambulatory Visit: Payer: Medicare Other | Admitting: Internal Medicine

## 2021-07-20 ENCOUNTER — Encounter (HOSPITAL_COMMUNITY)
Admission: RE | Admit: 2021-07-20 | Discharge: 2021-07-20 | Disposition: A | Discharge status: Home / Self Care | Payer: Medicare Other | Source: Ambulatory Visit | Attending: Cardiology | Admitting: Cardiology

## 2021-07-20 ENCOUNTER — Ambulatory Visit (HOSPITAL_COMMUNITY): Payer: Medicare Other

## 2021-07-20 DIAGNOSIS — Z48812 Encounter for surgical aftercare following surgery on the circulatory system: Secondary | ICD-10-CM | POA: Diagnosis not present

## 2021-07-20 DIAGNOSIS — Z794 Long term (current) use of insulin: Secondary | ICD-10-CM | POA: Diagnosis not present

## 2021-07-20 DIAGNOSIS — Z79899 Other long term (current) drug therapy: Secondary | ICD-10-CM | POA: Diagnosis not present

## 2021-07-20 DIAGNOSIS — Z955 Presence of coronary angioplasty implant and graft: Secondary | ICD-10-CM

## 2021-07-20 DIAGNOSIS — Z7982 Long term (current) use of aspirin: Secondary | ICD-10-CM | POA: Diagnosis not present

## 2021-07-20 NOTE — Progress Notes (Signed)
Jamas Lav Sr. 71 y.o. male Nutrition Note [place] is motivated to make lifestyle changes to aid with cardiac rehab. Patient has medical history of HTN, CAD, mild pulmonary hypertension, TIA, Barrett's esophagus, DM2, hyperlipemia, gastritis and gastroduodenitis with hemorrhage. He lives at home with his wife. He does the majority of the grocery shopping and cooking. 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 He reports that he has seen two dietitians previously but was confused about what to eat. He has recently started to eliminate carbs all together but admits increased hunger. He is also motivated to lose weight to ~170#.  Labs: A1c 8.3, B12 236, triglycerides 173  24 hour recall Breakfast: egg Mcmuffin, coffee with cream Lunch: Salad or sandwich Dinner: [employer] roast beef sandwich, water  Nutrition Diagnosis Food-and nutrition-related knowledge deficit related to lack of exposure to information as related to diagnosis of: CVD, DM2 Obesity related to excessive energy intake as evidenced by a 31.5  Nutrition Intervention Pt's individual nutrition plan reviewed with pt. Benefits of adopting Heart Healthy diet discussed.  Continue client-centered nutrition education by RD, as part of interdisciplinary care.  Monitor/Evaluation: Patient reports motivation to make lifestyle changes for adherence to heart healthy diet recommendation, blood sugar control, and weight management. We discussed the plate method as a guide for meal planning, increasing fiber intake, whole grains/complex carbohydrate sources, eating frequency and high fiber/high protein snacks Handouts/notes given. Patient amicable to RD suggestions and verbalizes understanding. Will follow-up as needed.   12 minutes spent in review of topics related to a heart healthy diet including sodium intake, blood sugar control, weight management, and fiber intake.  Goal(s) Patient to use the plate method as a guide  for meal planning Patient to increase high fiber foods including vegetables, fruit, whole grains. Make 1/2 the plate vegetables at meals.  Check in ~3-4 hours; prioritize high protein + high fiber snacks  Pt to identify and limit food sources of saturated fat, trans fat, refined carbohydrates and sodium Pt to identify food quantities necessary to achieve weight loss of 6-24 lb at graduation from cardiac rehab.  Pt able to name foods that affect blood glucose. Continue to limit simple sugars, refined carbohydrates, sugary beverages, etc.  Pt to describe the benefit of including lean protein/plant proteins, fruits, vegetables, whole grains, nuts/seeds, and low-fat dairy products in a heart healthy meal plan. Pt to practice mindful and intuitive eating exercises  Plan:  Will provide client-centered nutrition education as part of interdisciplinary care Monitor and evaluate progress toward nutrition goal with team.   Tekela Garguilo [place], MS, RDN, LDN

## 2021-07-23 ENCOUNTER — Ambulatory Visit (HOSPITAL_COMMUNITY): Payer: Medicare Other

## 2021-07-23 ENCOUNTER — Encounter (HOSPITAL_COMMUNITY)
Admission: RE | Admit: 2021-07-23 | Discharge: 2021-07-23 | Disposition: A | Discharge status: Home / Self Care | Payer: Medicare Other | Source: Ambulatory Visit | Attending: Cardiology | Admitting: Cardiology

## 2021-07-23 DIAGNOSIS — Z955 Presence of coronary angioplasty implant and graft: Secondary | ICD-10-CM | POA: Diagnosis not present

## 2021-07-23 DIAGNOSIS — Z79899 Other long term (current) drug therapy: Secondary | ICD-10-CM | POA: Diagnosis not present

## 2021-07-23 DIAGNOSIS — Z48812 Encounter for surgical aftercare following surgery on the circulatory system: Secondary | ICD-10-CM | POA: Diagnosis not present

## 2021-07-23 DIAGNOSIS — Z794 Long term (current) use of insulin: Secondary | ICD-10-CM | POA: Diagnosis not present

## 2021-07-23 DIAGNOSIS — Z7982 Long term (current) use of aspirin: Secondary | ICD-10-CM | POA: Diagnosis not present

## 2021-07-25 ENCOUNTER — Ambulatory Visit (HOSPITAL_COMMUNITY): Payer: Medicare Other

## 2021-07-25 ENCOUNTER — Encounter: Payer: Self-pay | Admitting: Cardiology

## 2021-07-25 ENCOUNTER — Encounter (HOSPITAL_COMMUNITY)
Admission: RE | Admit: 2021-07-25 | Discharge: 2021-07-25 | Disposition: A | Discharge status: Home / Self Care | Payer: Medicare Other | Source: Ambulatory Visit | Attending: Cardiology | Admitting: Cardiology

## 2021-07-25 DIAGNOSIS — Z955 Presence of coronary angioplasty implant and graft: Secondary | ICD-10-CM

## 2021-07-25 DIAGNOSIS — Z7982 Long term (current) use of aspirin: Secondary | ICD-10-CM | POA: Diagnosis not present

## 2021-07-25 DIAGNOSIS — Z79899 Other long term (current) drug therapy: Secondary | ICD-10-CM | POA: Diagnosis not present

## 2021-07-25 DIAGNOSIS — Z794 Long term (current) use of insulin: Secondary | ICD-10-CM | POA: Diagnosis not present

## 2021-07-25 DIAGNOSIS — Z48812 Encounter for surgical aftercare following surgery on the circulatory system: Secondary | ICD-10-CM | POA: Diagnosis not present

## 2021-07-27 ENCOUNTER — Encounter (HOSPITAL_COMMUNITY)
Admission: RE | Admit: 2021-07-27 | Discharge: 2021-07-27 | Disposition: A | Discharge status: Home / Self Care | Payer: Medicare Other | Source: Ambulatory Visit | Attending: Cardiology | Admitting: Cardiology

## 2021-07-27 ENCOUNTER — Ambulatory Visit (HOSPITAL_COMMUNITY): Payer: Medicare Other

## 2021-07-27 DIAGNOSIS — Z955 Presence of coronary angioplasty implant and graft: Secondary | ICD-10-CM

## 2021-07-27 DIAGNOSIS — Z7982 Long term (current) use of aspirin: Secondary | ICD-10-CM | POA: Diagnosis not present

## 2021-07-27 DIAGNOSIS — Z79899 Other long term (current) drug therapy: Secondary | ICD-10-CM | POA: Diagnosis not present

## 2021-07-27 DIAGNOSIS — Z794 Long term (current) use of insulin: Secondary | ICD-10-CM | POA: Diagnosis not present

## 2021-07-27 DIAGNOSIS — Z48812 Encounter for surgical aftercare following surgery on the circulatory system: Secondary | ICD-10-CM | POA: Diagnosis not present

## 2021-08-01 ENCOUNTER — Encounter (HOSPITAL_COMMUNITY)
Admission: RE | Admit: 2021-08-01 | Discharge: 2021-08-01 | Disposition: A | Discharge status: Home / Self Care | Payer: Medicare Other | Source: Ambulatory Visit | Attending: Cardiology | Admitting: Cardiology

## 2021-08-01 ENCOUNTER — Ambulatory Visit (HOSPITAL_COMMUNITY): Payer: Medicare Other

## 2021-08-01 DIAGNOSIS — Z7982 Long term (current) use of aspirin: Secondary | ICD-10-CM | POA: Diagnosis not present

## 2021-08-01 DIAGNOSIS — Z48812 Encounter for surgical aftercare following surgery on the circulatory system: Secondary | ICD-10-CM | POA: Diagnosis not present

## 2021-08-01 DIAGNOSIS — Z955 Presence of coronary angioplasty implant and graft: Secondary | ICD-10-CM

## 2021-08-01 DIAGNOSIS — Z794 Long term (current) use of insulin: Secondary | ICD-10-CM | POA: Diagnosis not present

## 2021-08-01 DIAGNOSIS — Z79899 Other long term (current) drug therapy: Secondary | ICD-10-CM | POA: Diagnosis not present

## 2021-08-02 ENCOUNTER — Other Ambulatory Visit: Payer: Self-pay | Admitting: Nurse Practitioner

## 2021-08-02 DIAGNOSIS — J410 Simple chronic bronchitis: Secondary | ICD-10-CM

## 2021-08-03 ENCOUNTER — Ambulatory Visit (HOSPITAL_COMMUNITY): Payer: Medicare Other

## 2021-08-03 ENCOUNTER — Encounter (HOSPITAL_COMMUNITY): Payer: Medicare Other

## 2021-08-06 ENCOUNTER — Ambulatory Visit: Payer: Medicare Other | Admitting: Cardiology

## 2021-08-06 ENCOUNTER — Encounter (HOSPITAL_COMMUNITY)
Admission: RE | Admit: 2021-08-06 | Discharge: 2021-08-06 | Disposition: A | Discharge status: Home / Self Care | Payer: Medicare Other | Source: Ambulatory Visit | Attending: Cardiology | Admitting: Cardiology

## 2021-08-06 ENCOUNTER — Ambulatory Visit (HOSPITAL_COMMUNITY): Payer: Medicare Other

## 2021-08-06 ENCOUNTER — Encounter: Payer: Self-pay | Admitting: Cardiology

## 2021-08-06 VITALS — BP 130/74 | HR 66 | Temp 98.2°F | Resp 16 | Ht 68.0 in | Wt 208.0 lb

## 2021-08-06 DIAGNOSIS — I25118 Atherosclerotic heart disease of native coronary artery with other forms of angina pectoris: Secondary | ICD-10-CM

## 2021-08-06 DIAGNOSIS — I10 Essential (primary) hypertension: Secondary | ICD-10-CM | POA: Diagnosis not present

## 2021-08-06 DIAGNOSIS — I209 Angina pectoris, unspecified: Secondary | ICD-10-CM | POA: Diagnosis not present

## 2021-08-06 DIAGNOSIS — I2510 Atherosclerotic heart disease of native coronary artery without angina pectoris: Secondary | ICD-10-CM | POA: Insufficient documentation

## 2021-08-06 DIAGNOSIS — R0609 Other forms of dyspnea: Secondary | ICD-10-CM | POA: Diagnosis not present

## 2021-08-06 DIAGNOSIS — Z955 Presence of coronary angioplasty implant and graft: Secondary | ICD-10-CM | POA: Diagnosis not present

## 2021-08-06 NOTE — Progress Notes (Signed)
Primary Physician/Referring:  Lauree Chandler, NP  Patient ID: Kenneth Lav Sr., male    DOB: 1950-06-28, 71 y.o.   MRN: [id number]  Chief Complaint  Patient presents with   Coronary Artery Disease   Follow-up   Hypertension     6 months   HPI:    Kenneth Ooley Sr.  is a 71 y.o. Caucasian male with coronary artery disease, with stents to his LAD, RCA and circumflex and also balloon angioplasty site to D1 and distal LAD was performed in 2016. Past medical history significant for hypertension, hyperlipidemia, GERD, Barrett's esophagus, statin intolerance, uncontrolled diabetes mellitus and also severe restless leg syndrome.  He also had florid COVID 19 infection in February 2021.  Patient underwent left heart catheterization 04/23/2021 with successful PCI to RCA.  Seen in office about 3 to 4 weeks ago, for worsening shortness of breath, leg edema and also worsening restless leg.  Amlodipine was discontinued, he was advised to continue with furosemide.  Since then patient states that his restless leg symptoms have gotten significantly improved, leg edema is improved, he is not having any more shortness of breath.  He also states that he has not had any further chest pains.  States that he is presently feeling well.  He is trying to watch his diet.  Past Medical History:  Diagnosis Date   Arthritis    "left thumb" (05/23/2017) right shoulder   Barrett's esophagus    "we've been told that it's all gone; still take RX for GERD" (05/23/2017)   Bilateral swelling of feet and ankles x 3 weeks as of 12-01-2019   CAD (coronary artery disease), native coronary artery    Coronary angiogram 05/03/2014:  Proximal LAD 3.0x12 mm Promus premier stent.  04/08/2014: Mid Cx 3.5 x 16 mm Promus DES.  03/29/2013: Mid LAD 2.75 x 38 mm Promus Premier drug-eluting stent, balloon angioplasty of D1 and distal LAD and stenting of distal RCA with 2.75 x 24 mm promos Premier drug-eluting stent 12/15/2012 widely patent.    Cervicalgia    Chronic bronchitis ([place])    "q yr" ([id number])   Complication of anesthesia    " I do not wake up very well "  slow to wake up   COVID-19 05/2019   all covid symptoms in hospital x 5 days, all symptoms resolved took monoclonal antibody tx    Dyspnea    with exertion   Family history of adverse reaction to anesthesia    "son w/PONV"   GERD (gastroesophageal reflux disease)    Heart murmur    "grew out of it"    Hyperlipidemia    Hypertension    Hypoglycemia, unspecified    IDDM (insulin dependent diabetes mellitus)    type 2   Impotence of organic origin    Iron deficiency anemia    Memory loss    mild   Other malaise and fatigue    Pneumonia 05/2019   PONV (postoperative nausea and vomiting)    after wisdom teeth pulled, no problems with other surgeries   Restless leg    Rotator cuff arthropathy    Stroke ([place])    Tension headache    "sometimes" (05/23/2017)   Unspecified gastritis and gastroduodenitis with hemorrhage    Unstable angina pectoris ([place]) 04/08/2014   Coronary angiogram 05/03/2014:  Proximal LAD 3.0x12 mm Promus premier stent.  04/08/2014: Mid Cx 3.5 x 16 mm Promus DES.  03/29/2013: Mid LAD 2.75 x 38 mm Promus Premier drug-eluting stent, balloon  angioplasty of D1 and distal LAD and stenting of distal RCA with 2.75 x 24 mm promos Premier drug-eluting stent 12/15/2012 widely patent.   ROS  Review of Systems  Cardiovascular:  Positive for dyspnea on exertion (improved) and leg swelling (bilateral). Negative for chest pain (resolved), claudication and palpitations.  Neurological:  Negative for dizziness.   Objective      08/06/2021   11:12 AM 07/12/2021   11:05 AM 07/06/2021    1:43 PM  Vitals with BMI  Height 5\' 8"  5' 8.25"   Weight 208 lbs 209 lbs 7 oz   BMI [id number]   Systolic [id number]  Diastolic 74 70 73  Pulse 66 74 71    Blood pressure 130/74, pulse 66, temperature 98.2 F (36.8 C), temperature source Temporal, resp. rate 16, height 5'  8" (1.727 m), weight 208 lb (94.3 kg), SpO2 97 %. Body mass index is 31.63 kg/m.   Physical Exam Vitals reviewed.  Constitutional:      Appearance: He is well-developed.  Neck:     Vascular: No carotid bruit.  Cardiovascular:     Rate and Rhythm: Normal rate and regular rhythm.     Pulses:          Carotid pulses are 2+ on the right side and 2+ on the left side.      Femoral pulses are 2+ on the right side and 2+ on the left side.      Dorsalis pedis pulses are 0 on the right side and 0 on the left side.       Posterior tibial pulses are 1+ on the right side and 0 on the left side.     Heart sounds: Murmur heard.  Blowing decrescendo early diastolic murmur is present at the upper right sternal border radiating to the apex.    No gallop.     Comments: No JVD. Pulmonary:     Effort: Pulmonary effort is normal.     Breath sounds: Normal breath sounds.  Abdominal:     General: Bowel sounds are normal.     Palpations: Abdomen is soft.  Musculoskeletal:     Right lower leg: Edema (1-2+) present.     Left lower leg: Edema (1-2+) present.   Laboratory examination:   Recent Labs    02/09/21 0240 02/10/21 1026 02/11/21 1137 02/21/21 0912 04/25/21 0949 07/06/21 1504  NA 132* 132* 133* 136 142 140  K 5.1 4.2 4.5 4.9 5.1 5.1  CL 98 100 100 102 104 102  CO2 26 24 24 27 24 23   GLUCOSE 327* 215* 260* 263* 91 130*  BUN 25* 19 17 20 22 20   CREATININE 1.52* 1.29* 1.02 1.19 1.35* 1.38*  CALCIUM 8.4* 9.0 9.1 9.3 9.8 9.9  GFRNONAA 49* 59* >60  --   --   --       Latest Ref Rng & Units 07/06/2021    3:04 PM 04/25/2021    9:49 AM 02/21/2021    9:12 AM  CMP  Glucose 70 - 99 mg/dL 130   91   263    BUN 8 - 27 mg/dL 20   22   20     Creatinine 0.76 - 1.27 mg/dL 1.38   1.35   1.19    Sodium 134 - 144 mmol/L 140   142   136    Potassium 3.5 - 5.2 mmol/L 5.1   5.1   4.9    Chloride 96 - 106 mmol/L 102  104   102    CO2 20 - 29 mmol/L 23   24   27     Calcium 8.6 - 10.2 mg/dL 9.9   9.8    9.3    Total Protein 6.1 - 8.1 g/dL   6.3    Total Bilirubin 0.2 - 1.2 mg/dL   0.6    AST 10 - 35 U/L   22    ALT 9 - 46 U/L   37        Latest Ref Rng & Units 04/25/2021    9:49 AM 02/21/2021    9:12 AM 02/10/2021   11:57 AM  CBC  WBC 3.4 - 10.8 x10E3/uL 5.3   5.8     Hemoglobin 13.0 - 17.7 g/dL 14.5   13.3     Hematocrit 37.5 - 51.0 % 43.1   39.3     Platelets 150 - 450 x10E3/uL 137   182   121     Lipid Panel Recent Labs    01/15/21 0811  CHOL 81  TRIG 174*  LDLCALC 15  VLDL 35  HDL 31*  CHOLHDL 2.6    HEMOGLOBIN A1C Lab Results  Component Value Date   HGBA1C 8.3 (A) 05/07/2021   MPG 180.03 01/15/2021   TSH Recent Labs    01/15/21 0811  TSH 2.684    BNP (last 3 results) Recent Labs    01/03/21 0825 07/06/21 1504  BNP 16.8 51.4    ProBNP (last 3 results) No results for input(s): PROBNP in the last 8760 hours.  Allergies   Allergies  Allergen Reactions   Amlodipine Other (See Comments)    Worsening restless legs, dyspnea and leg swelling   Lyrica [Pregabalin] Other (See Comments)    Made patient very lethargic the next morning, very hard to patient to function, move, etc.    Statins Other (See Comments)    Causes Restless Legs, rhabdomyolysis   Ativan [Lorazepam] Other (See Comments)    Markedly increased restless legs   Trulicity [Dulaglutide] Other (See Comments)    GI side effects      Final Medications at End of Visit    Current Outpatient Medications:    acetaminophen (TYLENOL) 500 MG tablet, Take 500 mg by mouth every 6 (six) hours as needed for mild pain, moderate pain, headache or fever., Disp: , Rfl:    albuterol (VENTOLIN HFA) 108 (90 Base) MCG/ACT inhaler, TAKE 2 PUFFS BY MOUTH EVERY 6 HOURS AS NEEDED FOR WHEEZE OR SHORTNESS OF BREATH, Disp: 18 each, Rfl: 1   amoxicillin (AMOXIL) 500 MG capsule, Take 500 mg by mouth 3 (three) times daily., Disp: , Rfl:    aspirin EC 81 MG tablet, Take 1 tablet (81 mg total) by mouth daily. Swallow  whole. (Patient taking differently: Take 81 mg by mouth every evening. Swallow whole.), Disp: 90 tablet, Rfl: 3   BD PEN NEEDLE NANO 2ND GEN 32G X 4 MM MISC, USE AS DIRECTED, Disp: 100 each, Rfl: 3   benzonatate (TESSALON) 100 MG capsule, Take 1 capsule (100 mg total) by mouth 2 (two) times daily as needed for cough., Disp: 20 capsule, Rfl: 0   clopidogrel (PLAVIX) 75 MG tablet, Take 1 tablet (75 mg total) by mouth daily. (Patient taking differently: Take 75 mg by mouth every evening.), Disp: 90 tablet, Rfl: 3   Continuous Blood Gluc Receiver (FREESTYLE LIBRE 14 DAY READER) DEVI, Inject 1 Device as directed daily as needed. Use once daily as direct to check blood  sugar E11.51, Disp: 1 each, Rfl: 0   Continuous Blood Gluc Sensor (FREESTYLE LIBRE 14 DAY SENSOR) MISC, Use to test blood sugar three times daily. E11.51. E11.59, Disp: 2 each, Rfl: 12   fluticasone (FLONASE) 50 MCG/ACT nasal spray, Place 1 spray into both nostrils daily as needed for allergies or rhinitis., Disp: , Rfl:    furosemide (LASIX) 40 MG tablet, Take 1 tablet (40 mg total) by mouth daily as needed for fluid or edema., Disp: 90 tablet, Rfl: 3   gabapentin (NEURONTIN) 100 MG capsule, TAKE 2 CAPSULES BY MOUTH AT BEDTIME., Disp: 180 capsule, Rfl: 1   glucose 4 GM chewable tablet, Chew 1 tablet (4 g total) by mouth as needed for low blood sugar., Disp: 50 tablet, Rfl: 12   guaiFENesin (MUCINEX) 600 MG 12 hr tablet, Take 600 mg by mouth 2 (two) times daily as needed (congestion/bronchial issues.)., Disp: , Rfl:    insulin aspart (NOVOLOG FLEXPEN) 100 UNIT/ML FlexPen, Inject 25-30 Units into the skin See admin instructions. Inject 30 units subcutaneously in the morning, inject 25 units subcutaneously with lunch & inject 25 units subcutaneously with dinner., Disp: , Rfl:    Insulin Glargine (BASAGLAR KWIKPEN) 100 UNIT/ML, Inject 30 Units into the skin at bedtime., Disp: 30 mL, Rfl: 3   insulin lispro (HUMALOG KWIKPEN) 100 UNIT/ML KwikPen, 3  times a day (just before each meal) 30-25-25 units, and pen needles 4/day, Disp: 90 mL, Rfl: 3   isosorbide mononitrate (IMDUR) 60 MG 24 hr tablet, TAKE 1 TABLET BY MOUTH EVERY DAY, Disp: 90 tablet, Rfl: 1   LIFESCAN FINEPOINT LANCETS MISC, Use to test for blood sugar three times daily dx: E11.51, Disp: 300 each, Rfl: 1   metoprolol succinate (TOPROL-XL) 25 MG 24 hr tablet, Take 1 tablet (25 mg total) by mouth daily. TAKE WITH OR IMMEDIATELY FOLLOWING A MEAL. (Patient taking differently: Take 25 mg by mouth every evening. TAKE 2 WITH OR IMMEDIATELY FOLLOWING A MEAL.), Disp: 90 tablet, Rfl: 1   nitroGLYCERIN (NITROSTAT) 0.4 MG SL tablet, PLACE 1 TABLET UNDER THE TONGUE EVERY 5 MINUTES AS NEEDED FOR CHEST PAIN., Disp: 50 tablet, Rfl: 0   ONETOUCH ULTRA test strip, USE TO TEST FOR BLOOD SUGAR THREE TIMES DAILY DX: E11.51, Disp: 300 strip, Rfl: 11   pantoprazole (PROTONIX) 40 MG tablet, TAKE ONE TABLET BY MOUTH ONCE DAILY FOR STOMACH (Patient taking differently: 40 mg every evening. Take one tablet by mouth once daily for stomach), Disp: 90 tablet, Rfl: 1   PRALUENT 150 MG/ML SOAJ, INJECT 1 PEN INTO THE SKIN EVERY 14 (FOURTEEN) DAYS., Disp: 6 mL, Rfl: 1   pramipexole (MIRAPEX) 1 MG tablet, TAKE 1 TABLET BY MOUTH AT 5PM, 1 TABLET BY MOUTH AT 7PM, AND TAKE 2 TABLETS BY MOUTH AT BEDTIME., Disp: 360 tablet, Rfl: 1   valsartan (DIOVAN) 80 MG tablet, Take 80 mg by mouth every evening., Disp: , Rfl:  No current facility-administered medications for this visit.  Facility-Administered Medications Ordered in Other Visits:    [COMPLETED] hydrALAZINE (APRESOLINE) injection 10 mg, 10 mg, Intravenous, Once, Adrian Prows, MD, 10 mg at 05/01/21 1054   Radiology   No results found.   Cardiac Studies:   Lower extremity venous duplex for insufficiency 05/29/2017: Bilateral lower extremities negative for DVT or reflux.  Lower Extremity Arterial Duplex 06/22/2020: No hemodynamically significant stenoses are identified  in the right or left lower extremity arterial system. Mildly  abnormal biphasic waveform noted throughout the lower extremity. The right and left  mid posterior tibial and mid anterior tibial arteries are non-compressible due to medial arteriosclerosis.   PCV ECHOCARDIOGRAM COMPLETE 123456 Normal LV systolic function with visual EF 60-65%. Left ventricle cavity is normal in size. Mild left ventricular hypertrophy. Normal global wall motion. Normal diastolic filling pattern, normal LAP. Mild (Grade I) aortic regurgitation. Mild tricuspid regurgitation. No evidence of pulmonary hypertension. Compared to study 06/22/2020 MR is now resolved otherwise no significant change.   PCV MYOCARDIAL PERFUSION WO LEXISCAN 03/19/2021 Abnormal ECG stress. The patient exercised for 5 minutes and 58 seconds of a Bruce protocol, achieving approximately 6.99 METs.  Resting EKG shows PRWP. Peak EKG revealed no ST-T wave abnormalities. Recovery EKG revealed 1 mm horizontal ST depression of the inferolateral leads. This is abnormal response and mildly positive for ischemia. Normal BP response. Myocardial perfusion is normal. There is mild diaphragmatic attenuation. Overall LV systolic function is normal without regional wall motion abnormalities. Stress LV EF: 66%. Compared to previous study: 2019/05/31, normal EKG response and no ischemia by nuclear perfusion, no chest pain. Low risk. Clinical correlation recommended.  Left Heart Catheterization 05/01/21:  LV 135/8, EDP 18 mmHg.  Ao 142/72, mean 104 mmHg.  No pressure gradient across the aortic valve. Left main: Not present LAD: Moderate to large vessel in the proximal segment however it is diffusely diseased.  Proximal 3.0 x 12 mm Promus DES placed 05/03/2014 widely patent.  Mid LAD 2.75 x 38 mm Promus Premier DES, balloon angioplasty of D1 and distal LAD performed 03/29/2013 is widely patent. CX: Large vessel, ostial circumflex 30 to 40% stenosis unchanged from prior  catheterization, a 3.5 x 16 mm Promus DES placed 04/08/2014 is widely patent. RCA: Large vessel, mid segment has a diffuse long segment 99% stenosis.  Previously placed 2.74 x 24 mm Promus Premier DES on 12/15/2012 is widely patent.   Interventional data: Successful PTCA and stenting of the mid and distal right coronary artery with 2 overlapping 2.5 x 38 mm and a 2.5 x 18 mm Onyx frontier DES, distal end of the stent overlapped 2.75 x 24 mm Promus Premier DES placed 12/15/2012.  TIMI II to TIMI-3 flow at the end of the procedure.   Recommendation: Patient to be observed for the small hematoma in the right forearm, but can be discharged home today if he remains stable, will be continued on aspirin and clopidogrel for at least 6 months.  110 mL contrast utilized.  EKG   EKG 08/06/2021: Normal sinus rhythm at the rate of 59 bpm, poor R progression, probably normal variant.  Voltage criteria for LVH in aVL.  No significant change from 07/06/2021.   Assessment     ICD-10-CM   1. Coronary artery disease of native artery of native heart with stable angina pectoris ([place])  I25.118 EKG 12-Lead    2. Essential hypertension  I10 EKG 12-Lead    Magnesium    3. Dyspnea on exertion  R06.09 EKG 12-Lead     No orders of the defined types were placed in this encounter.    Medications Discontinued During This Encounter  Medication Reason   valsartan (DIOVAN) 160 MG tablet    potassium chloride SA (KLOR-CON M) 20 MEQ tablet    Cholecalciferol (D-5000) 125 MCG (5000 UT) TABS    amLODipine (NORVASC) 5 MG tablet       Recommendations:   Kenneth Herndon Sr.  is a 71 y.o. Caucasian male with coronary artery disease, with stents to his LAD, RCA and circumflex and also balloon angioplasty  site to D1 and distal LAD was performed in 2016. Past medical history significant for hypertension, hyperlipidemia, GERD, Barrett's esophagus, statin intolerance, uncontrolled diabetes mellitus and also severe restless leg syndrome.   He also had florid COVID 19 infection in February 2021.  Patient underwent left heart catheterization 04/23/2021 with successful PCI to RCA.  Seen in office about 3 to 4 weeks ago, for worsening shortness of breath, leg edema and also worsening restless leg.  Since amlodipine was discontinued, his leg edema is improved, he still presently on Lasix which she will continue, advised him to keep his legs elevated, patient is now feeling remarkably improved with regard to his restless leg and also states that his dyspnea has essentially resolved.  Again reinforced control of diabetes mellitus.  Blood pressure is well controlled, no clinical evidence of heart failure, he has not had any recurrence of angina pectoris, I will see him back in 6 months or sooner if problems.   Adrian Prows, PA-C 08/06/2021, 7:58 PM Office: [phone]

## 2021-08-06 NOTE — Progress Notes (Signed)
CARDIAC REHAB PHASE 2  Reviewed home exercise with pt today. Pt is tolerating exercise well. Pt will continue to exercise on their own by walking and golf (may add [employer] soon) for 30-45 minutes per session 2 days a week in addition to the 3 days in CRP2. Advised pt on THRR, RPE scale, hydration and temperature/humidity precautions. Reinforced NTG use, S/S to stop exercise and when to call MD vs 911. Encouraged warm up cool down and stretches with exercise sessions. Pt verbalized understanding, all questions were answered and pt was given a copy to take home.    Harrie Jeans ACSM-CEP 08/06/2021 10:01 AM

## 2021-08-07 NOTE — Progress Notes (Signed)
Cardiac Individual Treatment Plan  Patient Details  Name: Wendel Homeyer Sr. MRN: [id number] Date of Birth: Apr 15, 1950 Referring Provider:   Flowsheet Row CARDIAC REHAB PHASE II ORIENTATION from 07/12/2021 in [facility]  Referring Provider Dr. Adrian Prows MD       Initial Encounter Date:  Alger from 07/12/2021 in [facility]  Date 07/12/21       Visit Diagnosis: 05/01/21 S/ P DES RCA  Patient's Home Medications on Admission:  Current Outpatient Medications:    acetaminophen (TYLENOL) 500 MG tablet, Take 500 mg by mouth every 6 (six) hours as needed for mild pain, moderate pain, headache or fever., Disp: , Rfl:    albuterol (VENTOLIN HFA) 108 (90 Base) MCG/ACT inhaler, TAKE 2 PUFFS BY MOUTH EVERY 6 HOURS AS NEEDED FOR WHEEZE OR SHORTNESS OF BREATH, Disp: 18 each, Rfl: 1   amoxicillin (AMOXIL) 500 MG capsule, Take 500 mg by mouth 3 (three) times daily., Disp: , Rfl:    aspirin EC 81 MG tablet, Take 1 tablet (81 mg total) by mouth daily. Swallow whole. (Patient taking differently: Take 81 mg by mouth every evening. Swallow whole.), Disp: 90 tablet, Rfl: 3   BD PEN NEEDLE NANO 2ND GEN 32G X 4 MM MISC, USE AS DIRECTED, Disp: 100 each, Rfl: 3   benzonatate (TESSALON) 100 MG capsule, Take 1 capsule (100 mg total) by mouth 2 (two) times daily as needed for cough., Disp: 20 capsule, Rfl: 0   clopidogrel (PLAVIX) 75 MG tablet, Take 1 tablet (75 mg total) by mouth daily. (Patient taking differently: Take 75 mg by mouth every evening.), Disp: 90 tablet, Rfl: 3   Continuous Blood Gluc Receiver (FREESTYLE LIBRE 14 DAY READER) DEVI, Inject 1 Device as directed daily as needed. Use once daily as direct to check blood sugar E11.51, Disp: 1 each, Rfl: 0   Continuous Blood Gluc Sensor (FREESTYLE LIBRE 14 DAY SENSOR) MISC, Use to test blood sugar three times daily. E11.51. E11.59, Disp: 2 each, Rfl: 12    fluticasone (FLONASE) 50 MCG/ACT nasal spray, Place 1 spray into both nostrils daily as needed for allergies or rhinitis., Disp: , Rfl:    furosemide (LASIX) 40 MG tablet, Take 1 tablet (40 mg total) by mouth daily as needed for fluid or edema., Disp: 90 tablet, Rfl: 3   gabapentin (NEURONTIN) 100 MG capsule, TAKE 2 CAPSULES BY MOUTH AT BEDTIME., Disp: 180 capsule, Rfl: 1   glucose 4 GM chewable tablet, Chew 1 tablet (4 g total) by mouth as needed for low blood sugar., Disp: 50 tablet, Rfl: 12   guaiFENesin (MUCINEX) 600 MG 12 hr tablet, Take 600 mg by mouth 2 (two) times daily as needed (congestion/bronchial issues.)., Disp: , Rfl:    insulin aspart (NOVOLOG FLEXPEN) 100 UNIT/ML FlexPen, Inject 25-30 Units into the skin See admin instructions. Inject 30 units subcutaneously in the morning, inject 25 units subcutaneously with lunch & inject 25 units subcutaneously with dinner., Disp: , Rfl:    Insulin Glargine (BASAGLAR KWIKPEN) 100 UNIT/ML, Inject 30 Units into the skin at bedtime., Disp: 30 mL, Rfl: 3   insulin lispro (HUMALOG KWIKPEN) 100 UNIT/ML KwikPen, 3 times a day (just before each meal) 30-25-25 units, and pen needles 4/day, Disp: 90 mL, Rfl: 3   isosorbide mononitrate (IMDUR) 60 MG 24 hr tablet, TAKE 1 TABLET BY MOUTH EVERY DAY, Disp: 90 tablet, Rfl: 1   LIFESCAN FINEPOINT LANCETS MISC, Use  to test for blood sugar three times daily dx: E11.51, Disp: 300 each, Rfl: 1   metoprolol succinate (TOPROL-XL) 25 MG 24 hr tablet, Take 1 tablet (25 mg total) by mouth daily. TAKE WITH OR IMMEDIATELY FOLLOWING A MEAL. (Patient taking differently: Take 25 mg by mouth every evening. TAKE 2 WITH OR IMMEDIATELY FOLLOWING A MEAL.), Disp: 90 tablet, Rfl: 1   nitroGLYCERIN (NITROSTAT) 0.4 MG SL tablet, PLACE 1 TABLET UNDER THE TONGUE EVERY 5 MINUTES AS NEEDED FOR CHEST PAIN., Disp: 50 tablet, Rfl: 0   ONETOUCH ULTRA test strip, USE TO TEST FOR BLOOD SUGAR THREE TIMES DAILY DX: E11.51, Disp: 300 strip, Rfl: 11    pantoprazole (PROTONIX) 40 MG tablet, TAKE ONE TABLET BY MOUTH ONCE DAILY FOR STOMACH (Patient taking differently: 40 mg every evening. Take one tablet by mouth once daily for stomach), Disp: 90 tablet, Rfl: 1   PRALUENT 150 MG/ML SOAJ, INJECT 1 PEN INTO THE SKIN EVERY 14 (FOURTEEN) DAYS., Disp: 6 mL, Rfl: 1   pramipexole (MIRAPEX) 1 MG tablet, TAKE 1 TABLET BY MOUTH AT 5PM, 1 TABLET BY MOUTH AT 7PM, AND TAKE 2 TABLETS BY MOUTH AT BEDTIME., Disp: 360 tablet, Rfl: 1   valsartan (DIOVAN) 80 MG tablet, Take 80 mg by mouth every evening., Disp: , Rfl:  No current facility-administered medications for this encounter.  Facility-Administered Medications Ordered in Other Encounters:    [COMPLETED] hydrALAZINE (APRESOLINE) injection 10 mg, 10 mg, Intravenous, Once, Adrian Prows, MD, 10 mg at 05/01/21 1054  Past Medical History: Past Medical History:  Diagnosis Date   Arthritis    "left thumb" (05/23/2017) right shoulder   Barrett's esophagus    "we've been told that it's all gone; still take RX for GERD" (05/23/2017)   Bilateral swelling of feet and ankles x 3 weeks as of 12-01-2019   CAD (coronary artery disease), native coronary artery    Coronary angiogram 05/03/2014:  Proximal LAD 3.0x12 mm Promus premier stent.  04/08/2014: Mid Cx 3.5 x 16 mm Promus DES.  03/29/2013: Mid LAD 2.75 x 38 mm Promus Premier drug-eluting stent, balloon angioplasty of D1 and distal LAD and stenting of distal RCA with 2.75 x 24 mm promos Premier drug-eluting stent 12/15/2012 widely patent.   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  04/08/2014: Mid Cx 3.5 x 16 mm Promus DES.  03/29/2013: Mid LAD 2.75 x 38 mm Promus Premier drug-eluting stent, balloon angioplasty of D1 and distal LAD and stenting of distal RCA with 2.75 x 24 mm promos Premier drug-eluting stent 12/15/2012 widely patent.    Tobacco Use: Social History   Tobacco Use  Smoking Status Never  Smokeless Tobacco Never    Labs: Review Flowsheet        Latest Ref Rng & Units 09/14/2020 12/25/2020 01/14/2021 01/15/2021  Labs for ITP Cardiac and Pulmonary Rehab  Cholestrol 0 - 200 mg/dL    81    LDL (calc) 0 - 99 mg/dL    15    HDL-C >40 mg/dL    31    Trlycerides <150 mg/dL    174    Hemoglobin A1c 4.0 - 5.6 % 8.6   8.1    7.9    TCO2 22 - 32 mmol/L   24        05/07/2021  Labs for ITP Cardiac and Pulmonary Rehab  Cholestrol   LDL (calc)   HDL-C   Trlycerides   Hemoglobin A1c 8.3    TCO2           Capillary Blood Glucose: Lab Results  Component Value Date   GLUCAP 155 (H) 07/18/2021   GLUCAP 273 (H) 07/18/2021   GLUCAP 136 (H) 07/16/2021   GLUCAP 248 (H) 07/16/2021   GLUCAP 215 (H) 05/01/2021     Exercise Target Goals: Exercise Program Goal: Individual exercise prescription set using results from initial 6 min walk test and THRR while considering  patient's activity barriers and safety.   Exercise  Prescription Goal: Initial exercise prescription builds to 30-45 minutes a day of aerobic activity, 2-3 days per week.  Home exercise guidelines will be given to patient during program as part of exercise prescription that the participant will acknowledge.  Activity Barriers & Risk Stratification:  Activity Barriers & Cardiac Risk Stratification - 07/12/21 0939       Activity Barriers & Cardiac Risk Stratification   Activity Barriers Arthritis;Deconditioning;Shortness of Breath    Cardiac Risk Stratification High             6 Minute Walk:  6 Minute Walk     Row Name 07/12/21 0936         6 Minute Walk   Phase Initial     Distance 1430 feet     Walk Time 6 minutes     # of Rest Breaks 0     MPH 2.71     METS 2.91     RPE 11     Perceived Dyspnea  0     VO2 Peak 10.19     Symptoms Yes (comment)     Comments Bliateral hip pain 1/10, Relieved by rest     Resting HR 74 bpm     Resting BP 124/70     Resting Oxygen Saturation  97 %     Exercise Oxygen Saturation  during 6 min walk 97 %     Max Ex. HR 93 bpm     Max Ex. BP 150/64     2 Minute Post BP 134/68              Oxygen Initial Assessment:   Oxygen Re-Evaluation:   Oxygen Discharge (Final Oxygen Re-Evaluation):   Initial Exercise Prescription:  Initial Exercise Prescription -  07/12/21 0900       Date of Initial Exercise RX and Referring Provider   Date 07/12/21    Referring Provider Dr. Adrian Prows MD    Expected Discharge Date 09/07/21      Treadmill   MPH 2.5    Grade 0    Minutes 15    METs 2.91      NuStep   Level 2    SPM 80    Minutes 15    METs 2.3      Prescription Details   Frequency (times per week) 3    Duration Progress to 30 minutes of continuous aerobic without signs/symptoms of physical distress      Intensity   THRR 40-80% of Max Heartrate 60-119    Ratings of Perceived Exertion 11-13    Perceived Dyspnea 0-4      Progression   Progression Continue progressive  overload as per policy without signs/symptoms or physical distress.      Resistance Training   Training Prescription Yes    Weight 3    Reps 10-15             Perform Capillary Blood Glucose checks as needed.  Exercise Prescription Changes:   Exercise Prescription Changes     Row Name 07/16/21 0830 08/06/21 0830           Response to Exercise   Blood Pressure (Admit) 134/70 122/80      Blood Pressure (Exercise) 160/78 152/82      Blood Pressure (Exit) 126/70 124/66      Heart Rate (Admit) 66 bpm 60 bpm      Heart Rate (Exercise) 102 bpm 104 bpm      Heart Rate (Exit) 72 bpm 64 bpm      Rating of Perceived Exertion (Exercise) 9 12      Perceived Dyspnea (Exercise) 0 0      Symptoms 0 0      Comments Pt first day in the CRP2 program Reviewed MET's, goals and Home ExRx      Duration Progress to 30 minutes of  aerobic without signs/symptoms of physical distress Progress to 30 minutes of  aerobic without signs/symptoms of physical distress      Intensity THRR unchanged THRR unchanged        Progression   Progression Continue to progress workloads to maintain intensity without signs/symptoms of physical distress. Continue to progress workloads to maintain intensity without signs/symptoms of physical distress.      Average METs 2.41 2.77        Resistance Training   Training Prescription Yes Yes      Weight 3 5      Reps 10-15 10-15      Time 10 Minutes 10 Minutes        Treadmill   MPH 2.5 2.7      Grade 0 1      Minutes 15 15      METs 2.91 3.44        NuStep   Level 2 2      SPM 80 100      Minutes 15 15      METs 1.9 2.1        Home Exercise Plan   Plans to continue exercise at -- Home (comment)      Frequency -- Add 2 additional days to program exercise sessions.      Initial Home Exercises Provided -- 08/06/21  Exercise Comments:   Exercise Comments     Row Name 07/16/21 0926 08/06/21 0830         Exercise Comments Pt first  day in the CRP2 program. Pt is tolerating exercise well with an average MET level of 2.41. Pt is learning his THRR, RPE and ExRx. Pt feels good about his exercise program. Reviewed MET's, goals and home ExRx. Pt is tolerating exercise well with an average MET level of 2.77. Pt will continue to exercise on his own by walking and golf 2 days a week. Pt plans to ask his [employer] about membership this week and will let us know if he choosees to add that in for exercise. Pt feels great about his goals so far, he has met with the RD, feels in better control of blood sugars, feels like he is getting in to shape slowy and is working on wt loss. Pt states SOB is doing really well and he feels in much better control.               Exercise Goals and Review:   Exercise Goals     Row Name 07/12/21 1104             Exercise Goals   Increase Physical Activity Yes       Intervention Provide advice, education, support and counseling about physical activity/exercise needs.;Develop an individualized exercise prescription for aerobic and resistive training based on initial evaluation findings, risk stratification, comorbidities and participant's personal goals.       Expected Outcomes Short Term: Attend rehab on a regular basis to increase amount of physical activity.;Long Term: Add in home exercise to make exercise part of routine and to increase amount of physical activity.;Long Term: Exercising regularly at least 3-5 days a week.       Increase Strength and Stamina Yes       Intervention Provide advice, education, support and counseling about physical activity/exercise needs.;Develop an individualized exercise prescription for aerobic and resistive training based on initial evaluation findings, risk stratification, comorbidities and participant's personal goals.       Expected Outcomes Short Term: Increase workloads from initial exercise prescription for resistance, speed, and METs.;Short Term: Perform  resistance training exercises routinely during rehab and add in resistance training at home;Long Term: Improve cardiorespiratory fitness, muscular endurance and strength as measured by increased METs and functional capacity (6MWT)       Able to understand and use rate of perceived exertion (RPE) scale Yes       Intervention Provide education and explanation on how to use RPE scale       Expected Outcomes Short Term: Able to use RPE daily in rehab to express subjective intensity level;Long Term:  Able to use RPE to guide intensity level when exercising independently       Knowledge and understanding of Target Heart Rate Range (THRR) Yes       Intervention Provide education and explanation of THRR including how the numbers were predicted and where they are located for reference       Expected Outcomes Short Term: Able to state/look up THRR;Long Term: Able to use THRR to govern intensity when exercising independently;Short Term: Able to use daily as guideline for intensity in rehab       Understanding of Exercise Prescription Yes       Intervention Provide education, explanation, and written materials on patient's individual exercise prescription       Expected Outcomes Short Term: Able to explain  program exercise prescription;Long Term: Able to explain home exercise prescription to exercise independently                Exercise Goals Re-Evaluation :  Exercise Goals Re-Evaluation     Row Name 07/16/21 0924 08/06/21 0830           Exercise Goal Re-Evaluation   Exercise Goals Review Increase Physical Activity;Increase Strength and Stamina;Able to understand and use rate of perceived exertion (RPE) scale;Knowledge and understanding of Target Heart Rate Range (THRR);Understanding of Exercise Prescription Increase Physical Activity;Increase Strength and Stamina;Able to understand and use rate of perceived exertion (RPE) scale;Knowledge and understanding of Target Heart Rate Range  (THRR);Understanding of Exercise Prescription      Comments Pt first day in the CRP2 program. Pt is tolerating exercise well with an average MET level of 2.41. Pt is learning his THRR, RPE and ExRx. Pt feels good about his exercise program. Reviewed MET's, goals and home ExRx. Pt is tolerating exercise well with an average MET level of 2.77. Pt will continue to exercise on his own by walking and golf 2 days a week. Pt plans to ask his [employer] about membership this week and will let us know if he choosees to add that in for exercise. Pt feels great about his goals so far, he has met with the RD, feels in better control of blood sugars, feels like he is getting in to shape slowy and is working on  wt loss. Pt states SOB is doing really well and he feels in much better control.      Expected Outcomes will continue to monitor pt and progress workloads as tolerated without sign or symptom. Pt will continue to exercise on his own 2 days a week. Will continue to monitor pt and progress workloads as tolerated without sign or symptom.               Discharge Exercise Prescription (Final Exercise Prescription Changes):  Exercise Prescription Changes - 08/06/21 0830       Response to Exercise   Blood Pressure (Admit) 122/80    Blood Pressure (Exercise) 152/82    Blood Pressure (Exit) 124/66    Heart Rate (Admit) 60 bpm    Heart Rate (Exercise) 104 bpm    Heart Rate (Exit) 64 bpm    Rating of Perceived Exertion (Exercise) 12    Perceived Dyspnea (Exercise) 0    Symptoms 0    Comments Reviewed MET's, goals and Home ExRx    Duration Progress to 30 minutes of  aerobic without signs/symptoms of physical distress    Intensity THRR unchanged      Progression   Progression Continue to progress workloads to maintain intensity without signs/symptoms of physical distress.    Average METs 2.77      Resistance Training   Training Prescription Yes    Weight 5    Reps 10-15    Time 10 Minutes      Treadmill    MPH 2.7    Grade 1    Minutes 15    METs 3.44      NuStep   Level 2    SPM 100    Minutes 15    METs 2.1      Home Exercise Plan   Plans to continue exercise at Home (comment)    Frequency Add 2 additional days to program exercise sessions.    Initial Home Exercises Provided 08/06/21  Nutrition:  Target Goals: Understanding of nutrition guidelines, daily intake of sodium <1544m, cholesterol <2046m calories 30% from fat and 7% or less from saturated fats, daily to have 5 or more servings of fruits and vegetables.  Biometrics:  Pre Biometrics - 07/12/21 1105       Pre Biometrics   Height 5' 8.25" (1.734 m)    Weight 95 kg    Waist Circumference 44.5 inches    Hip Circumference 43.5 inches    Waist to Hip Ratio 1.02 %    BMI (Calculated) 31.6    Triceps Skinfold 17 mm    % Body Fat 31.7 %    Grip Strength 31 kg    Flexibility 10.5 in    Single Leg Stand 26.8 seconds              Nutrition Therapy Plan and Nutrition Goals:  Nutrition Therapy & Goals - 07/20/21 1100       Nutrition Therapy   Diet Heart Healthy/ Carbohydrate Consistent      Personal Nutrition Goals   Nutrition Goal Patient to implement a heart healthy meal plan to include fruit, vegetables, whole grains, lean protein/plant protein, and nonfat dairy    Personal Goal #2 Patient to identify strategies for blood sugar control   The plate method, high fiber/high protein snacks, carbohydrate consistency, etc.   Personal Goal #3 Patient to limit food sources of trans fats, saturated fats, refined carbohydrates and sodium      Intervention Plan   Intervention Prescribe, educate and counsel regarding individualized specific dietary modifications aiming towards targeted core components such as weight, hypertension, lipid management, diabetes, heart failure and other comorbidities.;Nutrition handout(s) given to patient.    Expected Outcomes Short Term Goal: Understand basic principles of  dietary content, such as calories, fat, sodium, cholesterol and nutrients.;Long Term Goal: Adherence to prescribed nutrition plan.             Nutrition Assessments:  Nutrition Assessments - 07/16/21 0803       Rate Your Plate Scores   Pre Score 46            MEDIFICTS Score Key: ?70 Need to make dietary changes  40-70 Heart Healthy Diet ? 40 Therapeutic Level Cholesterol Diet   Flowsheet Row CARDIAC REHAB PHASE II EXERC[facility] Total Score on Ad[facility] 46      Picture Your Plate Scores: <4<82nhealthy dietary pattern with much room for improvement. 41-50 Dietary pattern unlikely to meet recommendations for good health and room for improvement. 51-60 More healthful dietary pattern, with some room for improvement.  >60 Healthy dietary pattern, although there may be some specific behaviors that could be improved.    Nutrition Goals Re-Evaluation:   Nutrition Goals Re-Evaluation:   Nutrition Goals Discharge (Final Nutrition Goals Re-Evaluation):   Psychosocial: Target Goals: Acknowledge presence or absence of significant depression and/or stress, maximize coping skills, provide positive support system. Participant is able to verbalize types and ability to use techniques and skills needed for reducing stress and depression.  Initial Review & Psychosocial Screening:  Initial Psych Review & Screening - 07/12/21 0831       Initial Review   Current issues with None Identified      Family Dynamics   Good Support System? Yes   RaPatricioas his wife for support     Barriers   Psychosocial barriers to participate in program There are no identifiable barriers  or psychosocial needs.      Screening Interventions   Interventions Encouraged to exercise             Quality of Life Scores:  Quality of Life - 07/12/21 1107       Quality of Life   Select Quality of Life      Quality of  Life Scores   Health/Function Pre 21.46 %    Socioeconomic Pre 25.31 %    Psych/Spiritual Pre 25.5 %    Family Pre 27 %    GLOBAL Pre 23.92 %            Scores of 19 and below usually indicate a poorer quality of life in these areas.  A difference of  2-3 points is a clinically meaningful difference.  A difference of 2-3 points in the total score of the Quality of Life Index has been associated with significant improvement in overall quality of life, self-image, physical symptoms, and general health in studies assessing change in quality of life.  PHQ-9: Review Flowsheet        07/12/2021 01/18/2021 03/06/2020 03/02/2020 10/28/2019  Depression screen PHQ 2/9  Decreased Interest 0 0 0 0 0  Down, Depressed, Hopeless 0 0 0 0 0  PHQ - 2 Score 0 0 0 0 0         Interpretation of Total Score  Total Score Depression Severity:  1-4 = Minimal depression, 5-9 = Mild depression, 10-14 = Moderate depression, 15-19 = Moderately severe depression, 20-27 = Severe depression   Psychosocial Evaluation and Intervention:  Psychosocial Evaluation - 07/16/21 0755       Psychosocial Evaluation & Interventions   Interventions Encouraged to exercise with the program and follow exercise prescription    Comments Sebastien who is previous participant in cardiac rehab denies any psychosocial barriers. Eben feels supported  by his spouse in his efforts to adopt heart healthy lifestlye.    Expected Outcomes Viren will remain free of any psychosocial barriers and continue positivwe and healthy outlook on life    Continue Psychosocial Services  No Follow up required             Psychosocial Re-Evaluation:  Psychosocial Re-Evaluation     [place] Name 08/07/21 0927             Psychosocial Re-Evaluation   Current issues with None Identified       Comments Sherif who is a previous participant in cardiac rehab denies any psychosocial barriers.  Lennart has a supportive wife and family.       Expected  Outcomes Hussein will remain free of any psychosocial barriers and continue to exhibit a positive and healthy lifestyle.       Interventions Encouraged to attend Cardiac Rehabilitation for the exercise       Continue Psychosocial Services  Follow up required by staff                Psychosocial Discharge (Final Psychosocial Re-Evaluation):  Psychosocial Re-Evaluation - 08/07/21 0927       Psychosocial Re-Evaluation   Current issues with None Identified    Comments Alpheus who is a previous participant in cardiac rehab denies any psychosocial barriers.  Haze has a supportive wife and family.    Expected Outcomes Nevan will remain free of any psychosocial barriers and continue to exhibit a positive and healthy lifestyle.    Interventions Encouraged to attend Cardiac Rehabilitation for the exercise    Continue Psychosocial Services  Follow  up required by staff             Vocational Rehabilitation: Provide vocational rehab assistance to qualifying candidates.   Vocational Rehab Evaluation & Intervention:  Vocational Rehab - 07/12/21 1517       Initial Vocational Rehab Evaluation & Intervention   Assessment shows need for Vocational Rehabilitation No   Raoul works part time and does not need vocational rehab at this time.            Education: Education Goals: Education classes will be provided on a weekly basis, covering required topics. Participant will state understanding/return demonstration of topics presented.  Learning Barriers/Preferences:  Learning Barriers/Preferences - 07/12/21 1108       Learning Barriers/Preferences   Learning Barriers Sight;Hearing   Patient wears contacts and [place]   Learning Preferences Group Instruction;Individual Instruction;Skilled Demonstration             Education Topics: Count Your Pulse:  -Group instruction provided by verbal instruction, demonstration, patient participation and written materials to support subject.   Instructors address importance of being able to find your pulse and how to count your pulse when at home without a heart monitor.  Patients get hands on experience counting their pulse with staff help and individually.   Heart Attack, Angina, and Risk Factor Modification:  -Group instruction provided by verbal instruction, video, and written materials to support subject.  Instructors address signs and symptoms of angina and heart attacks.    Also discuss risk factors for heart disease and how to make changes to improve heart health risk factors.   Functional Fitness:  -Group instruction provided by verbal instruction, demonstration, patient participation, and written materials to support subject.  Instructors address safety measures for doing things around the house.  Discuss how to get up and down off the floor, how to pick things up properly, how to safely get out of a chair without assistance, and balance training.   Meditation and Mindfulness:  -Group instruction provided by verbal instruction, patient participation, and written materials to support subject.  Instructor addresses importance of mindfulness and meditation practice to help reduce stress and improve awareness.  Instructor also leads participants through a meditation exercise.    Stretching for Flexibility and Mobility:  -Group instruction provided by verbal instruction, patient participation, and written materials to support subject.  Instructors lead participants through series of stretches that are designed to increase flexibility thus improving mobility.  These stretches are additional exercise for major muscle groups that are typically performed during regular warm up and cool down.   Hands Only CPR:  -Group verbal, video, and participation provides a basic overview of AHA guidelines for community CPR. Role-play of emergencies allow participants the opportunity to practice calling for help and chest compression technique with  discussion of AED use.   Hypertension: -Group verbal and written instruction that provides a basic overview of hypertension including the most recent diagnostic guidelines, risk factor reduction with self-care instructions and medication management.    Nutrition I class: Heart Healthy Eating:  -Group instruction provided by PowerPoint slides, verbal discussion, and written materials to support subject matter. The instructor gives an explanation and review of the Therapeutic Lifestyle Changes diet recommendations, which includes a discussion on lipid goals, dietary fat, sodium, fiber, plant stanol/sterol esters, sugar, and the components of a well-balanced, healthy diet.   Nutrition II class: Lifestyle Skills:  -Group instruction provided by PowerPoint slides, verbal discussion, and written materials to support subject matter. The instructor gives  an explanation and review of label reading, grocery shopping for heart health, heart healthy recipe modifications, and ways to make healthier choices when eating out.   Diabetes Question & Answer:  -Group instruction provided by PowerPoint slides, verbal discussion, and written materials to support subject matter. The instructor gives an explanation and review of diabetes co-morbidities, pre- and post-prandial blood glucose goals, pre-exercise blood glucose goals, signs, symptoms, and treatment of hypoglycemia and hyperglycemia, and foot care basics.   Diabetes Blitz:  -Group instruction provided by PowerPoint slides, verbal discussion, and written materials to support subject matter. The instructor gives an explanation and review of the physiology behind type 1 and type 2 diabetes, diabetes medications and rational behind using different medications, pre- and post-prandial blood glucose recommendations and Hemoglobin A1c goals, diabetes diet, and exercise including blood glucose guidelines for exercising safely.    Portion Distortion:  -Group  instruction provided by PowerPoint slides, verbal discussion, written materials, and food models to support subject matter. The instructor gives an explanation of serving size versus portion size, changes in portions sizes over the last 20 years, and what consists of a serving from each food group.   Stress Management:  -Group instruction provided by verbal instruction, video, and written materials to support subject matter.  Instructors review role of stress in heart disease and how to cope with stress positively.     Exercising on Your Own:  -Group instruction provided by verbal instruction, power point, and written materials to support subject.  Instructors discuss benefits of exercise, components of exercise, frequency and intensity of exercise, and end points for exercise.  Also discuss use of nitroglycerin and activating EMS.  Review options of places to exercise outside of rehab.  Review guidelines for sex with heart disease.   Cardiac Drugs I:  -Group instruction provided by verbal instruction and written materials to support subject.  Instructor reviews cardiac drug classes: antiplatelets, anticoagulants, beta blockers, and statins.  Instructor discusses reasons, side effects, and lifestyle considerations for each drug class.   Cardiac Drugs II:  -Group instruction provided by verbal instruction and written materials to support subject.  Instructor reviews cardiac drug classes: angiotensin converting enzyme inhibitors (ACE-I), angiotensin II receptor blockers (ARBs), nitrates, and calcium channel blockers.  Instructor discusses reasons, side effects, and lifestyle considerations for each drug class.   Anatomy and Physiology of the Circulatory System:  Group verbal and written instruction and models provide basic cardiac anatomy and physiology, with the coronary electrical and arterial systems. Review of: AMI, Angina, Valve disease, Heart Failure, Peripheral Artery Disease, Cardiac  Arrhythmia, Pacemakers, and the ICD.   Other Education:  -Group or individual verbal, written, or video instructions that support the educational goals of the cardiac rehab program.   Holiday Eating Survival Tips:  -Group instruction provided by PowerPoint slides, verbal discussion, and written materials to support subject matter. The instructor gives patients tips, tricks, and techniques to help them not only survive but enjoy the holidays despite the onslaught of food that accompanies the holidays.   Knowledge Questionnaire Score:  Knowledge Questionnaire Score - 07/12/21 1110       Knowledge Questionnaire Score   Pre Score 19/24             Core Components/Risk Factors/Patient Goals at Admission:  Personal Goals and Risk Factors at Admission - 07/12/21 1118       Core Components/Risk Factors/Patient Goals on Admission    Weight Management Yes    Intervention Weight Management: Develop a combined  nutrition and exercise program designed to reach desired caloric intake, while maintaining appropriate intake of nutrient and fiber, sodium and fats, and appropriate energy expenditure required for the weight goal.;Weight Management: Provide education and appropriate resources to help participant work on and attain dietary goals.;Weight Management/Obesity: Establish reasonable short term and long term weight goals.    Expected Outcomes Short Term: Continue to assess and modify interventions until short term weight is achieved;Long Term: Adherence to nutrition and physical activity/exercise program aimed toward attainment of established weight goal;Understanding recommendations for meals to include 15-35% energy as protein, 25-35% energy from fat, 35-60% energy from carbohydrates, less than 212m of dietary cholesterol, 20-35 gm of total fiber daily;Understanding of distribution of calorie intake throughout the day with the consumption of 4-5 meals/snacks    Diabetes Yes    Intervention  Provide education about signs/symptoms and action to take for hypo/hyperglycemia.;Provide education about proper nutrition, including hydration, and aerobic/resistive exercise prescription along with prescribed medications to achieve blood glucose in normal ranges: Fasting glucose 65-99 mg/dL    Expected Outcomes Short Term: Participant verbalizes understanding of the signs/symptoms and immediate care of hyper/hypoglycemia, proper foot care and importance of medication, aerobic/resistive exercise and nutrition plan for blood glucose control.;Long Term: Attainment of HbA1C < 7%.    Hypertension Yes    Intervention Provide education on lifestyle modifcations including regular physical activity/exercise, weight management, moderate sodium restriction and increased consumption of fresh fruit, vegetables, and low fat dairy, alcohol moderation, and smoking cessation.;Monitor prescription use compliance.    Expected Outcomes Long Term: Maintenance of blood pressure at goal levels.;Short Term: Continued assessment and intervention until BP is < 140/966mHG in hypertensive participants. < 130/8073mG in hypertensive participants with diabetes, heart failure or chronic kidney disease.    Lipids Yes    Intervention Provide education and support for participant on nutrition & aerobic/resistive exercise along with prescribed medications to achieve LDL <1m54mDL >40mg56m Expected Outcomes Short Term: Participant states understanding of desired cholesterol values and is compliant with medications prescribed. Participant is following exercise prescription and nutrition guidelines.;Long Term: Cholesterol controlled with medications as prescribed, with individualized exercise RX and with personalized nutrition plan. Value goals: LDL < 1mg,29m > 40 mg.    Personal Goal Other Yes    Personal Goal Short term goal: R.D consult and sugar control, Long term goal: Get in shape and walk further w/o SOB.    Intervention Will  continue to monitor patient and progress worload without sign or symptom    Expected Outcomes Patient will acheive their goals.             Core Components/Risk Factors/Patient Goals Review:   Goals and Risk Factor Review     Row Name 08/07/21 0931             Core Components/Risk Factors/Patient Goals Review   Personal Goals Review Weight Management/Obesity;Diabetes;Other;Hypertension;Lipids;Improve shortness of breath with ADL's       Review Najir Sanjuanompleted 8 exercise sessions in Cardiac rehab.  Pt is off to a good start with good attendance. Olliver Esaw .6kg weight loss since the beginning.  Vital signs have been stable with the exception of reported blood glucose readings from home which range in mid 200 range.  Despite nutritional counseling currently as well as pevious participations, Zakaree Reileytruggled to manage his diabeties, latest A1C- 8.3.  Tyreque Stanislawts shortness of breath when walking at home particularily with incline. Pt denies any difference when he  took Lasix for 3 days.  Pt denies COPD.  Shortness of breath could be the result of his body hiatius. Continue to work toward his personal goal for "sugar control" and getting in shape to be able to walk further with no shortness of breath.       Expected Outcomes Nayquan will continue to engage in Cardiac Rehab for exercise, nutrition and adapatation of heart healthy lifestyle modifications.                Core Components/Risk Factors/Patient Goals at Discharge (Final Review):   Goals and Risk Factor Review - 08/07/21 0931       Core Components/Risk Factors/Patient Goals Review   Personal Goals Review Weight Management/Obesity;Diabetes;Other;Hypertension;Lipids;Improve shortness of breath with ADL's    Review Jaydien has completed 8 exercise sessions in Cardiac rehab.  Pt is off to a good start with good attendance. Jonel has a .6kg weight loss since the beginning.  Vital signs have been stable with the exception of  reported blood glucose readings from home which range in mid 200 range.  Despite nutritional counseling currently as well as pevious participations, Jahaan has struggled to manage his diabeties, latest A1C- 8.3.  Alphonsus reports shortness of breath when walking at home particularily with incline. Pt denies any difference when he took Lasix for 3 days.  Pt denies COPD.  Shortness of breath could be the result of his body hiatius. Continue to work toward his personal goal for "sugar control" and getting in shape to be able to walk further with no shortness of breath.    Expected Outcomes Osmin will continue to engage in Cardiac Rehab for exercise, nutrition and adapatation of heart healthy lifestyle modifications.             ITP Comments:  ITP Comments     Row Name 07/12/21 0819           ITP Comments Dr Fransico Him MD, Medical Director                Comments: Pt is making expected progress toward personal goals after completing 9 sessions. Recommend continued exercise and life style modification education including  stress management and relaxation techniques to decrease cardiac risk profile. Cherre Huger, BSN Cardiac and Training and development officer

## 2021-08-08 ENCOUNTER — Ambulatory Visit (HOSPITAL_COMMUNITY): Payer: Medicare Other

## 2021-08-08 ENCOUNTER — Encounter (HOSPITAL_COMMUNITY)
Admission: RE | Admit: 2021-08-08 | Discharge: 2021-08-08 | Disposition: A | Discharge status: Home / Self Care | Payer: Medicare Other | Source: Ambulatory Visit | Attending: Cardiology | Admitting: Cardiology

## 2021-08-08 ENCOUNTER — Encounter: Payer: Self-pay | Admitting: Endocrinology

## 2021-08-08 DIAGNOSIS — Z955 Presence of coronary angioplasty implant and graft: Secondary | ICD-10-CM

## 2021-08-08 DIAGNOSIS — I2510 Atherosclerotic heart disease of native coronary artery without angina pectoris: Secondary | ICD-10-CM | POA: Diagnosis not present

## 2021-08-08 NOTE — Progress Notes (Signed)
[name] is in today for cardiac rehab.  During the warm up, pt experienced increased shortness of breath. Bp 150/80, O2 sat 94. Pt has struggled with shortness of breath with minimal activity for quite a while.  This is well documented in progress notes. Breath sounds clear bilateral. Noted that O2 saturation did increase when [place] stood up.  Felt better.  Admitted to be very tired as he had a busy day yesterday and did not rest well.  Asked pt if he felt like he wanted to exercise. [name] stated he could but would take it easy.  Pt tolerated 15 minutes of exercise on the treadmill for 15 minutes at reduced speed of 2.2 verse 2.5.  o2 sat 97.  [name] then exercised on the nustep for 15 minutes but did report more shortness of breath being in a seated position verses standing.  Participated in cool down stretches no further complaint.  Exit vital signs within norm limits.  Upon leaving the department, pt reported his blood glucose on his CGM was 63, asymptomatic.  Gave pt 2 packs of graham crackers and ginger ale.  Rechecked prior to 15 minutes and had not finished his snack - 61.  Remained asymptomatic.  Sam, RD did consult with pt regarding meal intake and blood glucose trends at home.  Pt with episodes of hypoglycemia during the day which he will treat.  [name] decides how much insulin he should take based upon the amount of food he will consume.  For example his blood glucose this morning was 157 he gave himself 30 units of  Novolog and he ate a half a egg salad sandwich.  He made the egg salad with one egg and mayonnaise with one slice of bread. Discussed with RD, plan to cut insulin dose to 15 on exercise days to see if this will decrease hypoglycemic events.  Will continue to monitor. Cherre Huger, BSN Cardiac and Training and development officer

## 2021-08-09 ENCOUNTER — Encounter: Payer: Self-pay | Admitting: *Deleted

## 2021-08-09 DIAGNOSIS — Z006 Encounter for examination for normal comparison and control in clinical research program: Secondary | ICD-10-CM

## 2021-08-09 NOTE — Research (Signed)
Spoke with Mr Woo about essence research. He states that he would like more information. Emailed consent for him to review.

## 2021-08-10 ENCOUNTER — Ambulatory Visit (HOSPITAL_COMMUNITY): Payer: Medicare Other

## 2021-08-10 ENCOUNTER — Encounter (HOSPITAL_COMMUNITY)
Admission: RE | Admit: 2021-08-10 | Discharge: 2021-08-10 | Disposition: A | Discharge status: Home / Self Care | Payer: Medicare Other | Source: Ambulatory Visit | Attending: Cardiology | Admitting: Cardiology

## 2021-08-10 DIAGNOSIS — Z955 Presence of coronary angioplasty implant and graft: Secondary | ICD-10-CM | POA: Diagnosis not present

## 2021-08-10 DIAGNOSIS — I2510 Atherosclerotic heart disease of native coronary artery without angina pectoris: Secondary | ICD-10-CM | POA: Diagnosis not present

## 2021-08-13 ENCOUNTER — Encounter (HOSPITAL_COMMUNITY)
Admission: RE | Admit: 2021-08-13 | Discharge: 2021-08-13 | Disposition: A | Discharge status: Home / Self Care | Payer: Medicare Other | Source: Ambulatory Visit | Attending: Cardiology | Admitting: Cardiology

## 2021-08-13 ENCOUNTER — Ambulatory Visit (HOSPITAL_COMMUNITY): Payer: Medicare Other

## 2021-08-13 DIAGNOSIS — Z955 Presence of coronary angioplasty implant and graft: Secondary | ICD-10-CM | POA: Diagnosis not present

## 2021-08-13 DIAGNOSIS — I2510 Atherosclerotic heart disease of native coronary artery without angina pectoris: Secondary | ICD-10-CM | POA: Diagnosis not present

## 2021-08-15 ENCOUNTER — Ambulatory Visit (HOSPITAL_COMMUNITY): Payer: Medicare Other

## 2021-08-15 ENCOUNTER — Other Ambulatory Visit: Payer: Self-pay | Admitting: Nurse Practitioner

## 2021-08-15 ENCOUNTER — Encounter (HOSPITAL_COMMUNITY)
Admission: RE | Admit: 2021-08-15 | Discharge: 2021-08-15 | Disposition: A | Discharge status: Home / Self Care | Payer: Medicare Other | Source: Ambulatory Visit | Attending: Cardiology | Admitting: Cardiology

## 2021-08-15 DIAGNOSIS — Z955 Presence of coronary angioplasty implant and graft: Secondary | ICD-10-CM | POA: Diagnosis not present

## 2021-08-15 DIAGNOSIS — I2510 Atherosclerotic heart disease of native coronary artery without angina pectoris: Secondary | ICD-10-CM | POA: Diagnosis not present

## 2021-08-15 DIAGNOSIS — E1159 Type 2 diabetes mellitus with other circulatory complications: Secondary | ICD-10-CM

## 2021-08-15 LAB — GLUCOSE, CAPILLARY: Glucose-Capillary: 256 mg/dL — ABNORMAL HIGH (ref 70–99)

## 2021-08-17 ENCOUNTER — Ambulatory Visit (HOSPITAL_COMMUNITY): Payer: Medicare Other

## 2021-08-17 ENCOUNTER — Encounter (HOSPITAL_COMMUNITY)
Admission: RE | Admit: 2021-08-17 | Discharge: 2021-08-17 | Disposition: A | Discharge status: Home / Self Care | Payer: Medicare Other | Source: Ambulatory Visit | Attending: Cardiology | Admitting: Cardiology

## 2021-08-17 DIAGNOSIS — I2510 Atherosclerotic heart disease of native coronary artery without angina pectoris: Secondary | ICD-10-CM | POA: Diagnosis not present

## 2021-08-17 DIAGNOSIS — Z955 Presence of coronary angioplasty implant and graft: Secondary | ICD-10-CM | POA: Diagnosis not present

## 2021-08-17 LAB — GLUCOSE, CAPILLARY
Glucose-Capillary: 112 mg/dL — ABNORMAL HIGH (ref 70–99)
Glucose-Capillary: 75 mg/dL (ref 70–99)

## 2021-08-20 ENCOUNTER — Ambulatory Visit (HOSPITAL_COMMUNITY): Payer: Medicare Other

## 2021-08-20 ENCOUNTER — Encounter (HOSPITAL_COMMUNITY): Payer: Medicare Other

## 2021-08-22 ENCOUNTER — Ambulatory Visit (HOSPITAL_COMMUNITY): Payer: Medicare Other

## 2021-08-22 ENCOUNTER — Encounter (HOSPITAL_COMMUNITY)
Admission: RE | Admit: 2021-08-22 | Discharge: 2021-08-22 | Disposition: A | Discharge status: Home / Self Care | Payer: Medicare Other | Source: Ambulatory Visit | Attending: Cardiology | Admitting: Cardiology

## 2021-08-22 DIAGNOSIS — Z955 Presence of coronary angioplasty implant and graft: Secondary | ICD-10-CM | POA: Diagnosis not present

## 2021-08-22 DIAGNOSIS — I2510 Atherosclerotic heart disease of native coronary artery without angina pectoris: Secondary | ICD-10-CM | POA: Diagnosis not present

## 2021-08-22 NOTE — Progress Notes (Signed)
08/17/21 [name] in for cardiac rehab.  Tolerated exercise with no complaints, VSS.  At the conclusion of exercise, pt complained of not feeling well.  He checked his meter for his blood glucose. 75.  Pt  pre exercise reading was 228.  Pt age breakfast and administered 32 units of insulin because he felt that was what he needed since his reading was so high and he ate.  Pt given 12 ounce can of ginger ale to drink.  Talked extensively with Sam, RD regarding the effects of exercise on blood glucose, not to over treat and balancing meals with protein and carbohydrates.  Rechecked his blood glucose after finishing the soda - 112.  Pt felt  much better and planned to eat something with protein when he got home.  Pt has new pt appt with Dr. Elvera Lennox, previously with Dr. Everardo All to talk about medications in conjunction with his eating habits now and activity level increase. Alanson Aly, BSN Cardiac and Emergency planning/management officer

## 2021-08-24 ENCOUNTER — Encounter (HOSPITAL_COMMUNITY)
Admission: RE | Admit: 2021-08-24 | Discharge: 2021-08-24 | Disposition: A | Discharge status: Home / Self Care | Payer: Medicare Other | Source: Ambulatory Visit | Attending: Cardiology | Admitting: Cardiology

## 2021-08-24 ENCOUNTER — Ambulatory Visit (HOSPITAL_COMMUNITY): Payer: Medicare Other

## 2021-08-24 DIAGNOSIS — I2510 Atherosclerotic heart disease of native coronary artery without angina pectoris: Secondary | ICD-10-CM | POA: Diagnosis not present

## 2021-08-24 DIAGNOSIS — Z955 Presence of coronary angioplasty implant and graft: Secondary | ICD-10-CM | POA: Diagnosis not present

## 2021-08-27 ENCOUNTER — Encounter (HOSPITAL_COMMUNITY)
Admission: RE | Admit: 2021-08-27 | Discharge: 2021-08-27 | Disposition: A | Discharge status: Home / Self Care | Payer: Medicare Other | Source: Ambulatory Visit | Attending: Cardiology | Admitting: Cardiology

## 2021-08-27 ENCOUNTER — Ambulatory Visit (HOSPITAL_COMMUNITY): Payer: Medicare Other

## 2021-08-27 DIAGNOSIS — I2510 Atherosclerotic heart disease of native coronary artery without angina pectoris: Secondary | ICD-10-CM | POA: Diagnosis not present

## 2021-08-27 DIAGNOSIS — Z955 Presence of coronary angioplasty implant and graft: Secondary | ICD-10-CM | POA: Diagnosis not present

## 2021-08-29 ENCOUNTER — Encounter (HOSPITAL_COMMUNITY)
Admission: RE | Admit: 2021-08-29 | Discharge: 2021-08-29 | Disposition: A | Discharge status: Home / Self Care | Payer: Medicare Other | Source: Ambulatory Visit | Attending: Cardiology | Admitting: Cardiology

## 2021-08-29 ENCOUNTER — Ambulatory Visit (HOSPITAL_COMMUNITY): Payer: Medicare Other

## 2021-08-29 DIAGNOSIS — Z955 Presence of coronary angioplasty implant and graft: Secondary | ICD-10-CM | POA: Diagnosis not present

## 2021-08-29 DIAGNOSIS — I2510 Atherosclerotic heart disease of native coronary artery without angina pectoris: Secondary | ICD-10-CM | POA: Diagnosis not present

## 2021-08-29 NOTE — Progress Notes (Signed)
Cardiac Individual Treatment Plan  Patient Details  Name: Kenneth Propes Sr. MRN: [id number] Date of Birth: Sep 19, 1950 Referring Provider:   Flowsheet Row CARDIAC REHAB PHASE II ORIENTATION from 07/12/2021 in [facility]  Referring Provider Dr. Adrian Prows MD       Initial Encounter Date:  [facility] from 07/12/2021 in [facility]  Date 07/12/21       Visit Diagnosis: 05/01/21 S/ P DES RCA  Patient's Home Medications on Admission:  Current Outpatient Medications:    acetaminophen (TYLENOL) 500 MG tablet, Take 500 mg by mouth every 6 (six) hours as needed for mild pain, moderate pain, headache or fever., Disp: , Rfl:    albuterol (VENTOLIN HFA) 108 (90 Base) MCG/ACT inhaler, TAKE 2 PUFFS BY MOUTH EVERY 6 HOURS AS NEEDED FOR WHEEZE OR SHORTNESS OF BREATH, Disp: 18 each, Rfl: 1   amoxicillin (AMOXIL) 500 MG capsule, Take 500 mg by mouth 3 (three) times daily., Disp: , Rfl:    aspirin EC 81 MG tablet, Take 1 tablet (81 mg total) by mouth daily. Swallow whole. (Patient taking differently: Take 81 mg by mouth every evening. Swallow whole.), Disp: 90 tablet, Rfl: 3   BD PEN NEEDLE NANO 2ND GEN 32G X 4 MM MISC, USE AS DIRECTED, Disp: 100 each, Rfl: 3   benzonatate (TESSALON) 100 MG capsule, Take 1 capsule (100 mg total) by mouth 2 (two) times daily as needed for cough., Disp: 20 capsule, Rfl: 0   clopidogrel (PLAVIX) 75 MG tablet, Take 1 tablet (75 mg total) by mouth daily. (Patient taking differently: Take 75 mg by mouth every evening.), Disp: 90 tablet, Rfl: 3   Continuous Blood Gluc Receiver (FREESTYLE LIBRE 14 DAY READER) DEVI, Inject 1 Device as directed daily as needed. Use once daily as direct to check blood sugar E11.51, Disp: 1 each, Rfl: 0   Continuous Blood Gluc Sensor (FREESTYLE LIBRE 14 DAY SENSOR) MISC, USE TO TEST BLOOD SUGAR THREE TIMES DAILY. E11.51. E11.59, Disp: 1 each, Rfl: 12    fluticasone (FLONASE) 50 MCG/ACT nasal spray, Place 1 spray into both nostrils daily as needed for allergies or rhinitis., Disp: , Rfl:    furosemide (LASIX) 40 MG tablet, Take 1 tablet (40 mg total) by mouth daily as needed for fluid or edema., Disp: 90 tablet, Rfl: 3   gabapentin (NEURONTIN) 100 MG capsule, TAKE 2 CAPSULES BY MOUTH AT BEDTIME., Disp: 180 capsule, Rfl: 1   glucose 4 GM chewable tablet, Chew 1 tablet (4 g total) by mouth as needed for low blood sugar., Disp: 50 tablet, Rfl: 12   guaiFENesin (MUCINEX) 600 MG 12 hr tablet, Take 600 mg by mouth 2 (two) times daily as needed (congestion/bronchial issues.)., Disp: , Rfl:    insulin aspart (NOVOLOG FLEXPEN) 100 UNIT/ML FlexPen, Inject 25-30 Units into the skin See admin instructions. Inject 30 units subcutaneously in the morning, inject 25 units subcutaneously with lunch & inject 25 units subcutaneously with dinner., Disp: , Rfl:    Insulin Glargine (BASAGLAR KWIKPEN) 100 UNIT/ML, Inject 30 Units into the skin at bedtime., Disp: 30 mL, Rfl: 3   insulin lispro (HUMALOG KWIKPEN) 100 UNIT/ML KwikPen, 3 times a day (just before each meal) 30-25-25 units, and pen needles 4/day, Disp: 90 mL, Rfl: 3   isosorbide mononitrate (IMDUR) 60 MG 24 hr tablet, TAKE 1 TABLET BY MOUTH EVERY DAY, Disp: 90 tablet, Rfl: 1   LIFESCAN FINEPOINT LANCETS MISC, Use  to test for blood sugar three times daily dx: E11.51, Disp: 300 each, Rfl: 1   metoprolol succinate (TOPROL-XL) 25 MG 24 hr tablet, Take 1 tablet (25 mg total) by mouth daily. TAKE WITH OR IMMEDIATELY FOLLOWING A MEAL. (Patient taking differently: Take 25 mg by mouth every evening. TAKE 2 WITH OR IMMEDIATELY FOLLOWING A MEAL.), Disp: 90 tablet, Rfl: 1   nitroGLYCERIN (NITROSTAT) 0.4 MG SL tablet, PLACE 1 TABLET UNDER THE TONGUE EVERY 5 MINUTES AS NEEDED FOR CHEST PAIN., Disp: 50 tablet, Rfl: 0   ONETOUCH ULTRA test strip, USE TO TEST FOR BLOOD SUGAR THREE TIMES DAILY DX: E11.51, Disp: 300 strip, Rfl: 11    pantoprazole (PROTONIX) 40 MG tablet, TAKE ONE TABLET BY MOUTH ONCE DAILY FOR STOMACH (Patient taking differently: 40 mg every evening. Take one tablet by mouth once daily for stomach), Disp: 90 tablet, Rfl: 1   PRALUENT 150 MG/ML SOAJ, INJECT 1 PEN INTO THE SKIN EVERY 14 (FOURTEEN) DAYS., Disp: 6 mL, Rfl: 1   pramipexole (MIRAPEX) 1 MG tablet, TAKE 1 TABLET BY MOUTH AT 5PM, 1 TABLET BY MOUTH AT 7PM, AND TAKE 2 TABLETS BY MOUTH AT BEDTIME., Disp: 360 tablet, Rfl: 1   valsartan (DIOVAN) 80 MG tablet, Take 80 mg by mouth every evening., Disp: , Rfl:  No current facility-administered medications for this encounter.  Facility-Administered Medications Ordered in Other Encounters:    [COMPLETED] hydrALAZINE (APRESOLINE) injection 10 mg, 10 mg, Intravenous, Once, Adrian Prows, MD, 10 mg at 05/01/21 1054  Past Medical History: Past Medical History:  Diagnosis Date   Arthritis    "left thumb" (05/23/2017) right shoulder   Barrett's esophagus    "we've been told that it's all gone; still take RX for GERD" (05/23/2017)   Bilateral swelling of feet and ankles x 3 weeks as of 12-01-2019   CAD (coronary artery disease), native coronary artery    Coronary angiogram 05/03/2014:  Proximal LAD 3.0x12 mm Promus premier stent.  04/08/2014: Mid Cx 3.5 x 16 mm Promus DES.  03/29/2013: Mid LAD 2.75 x 38 mm Promus Premier drug-eluting stent, balloon angioplasty of D1 and distal LAD and stenting of distal RCA with 2.75 x 24 mm promos Premier drug-eluting stent 12/15/2012 widely patent.   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  04/08/2014: Mid Cx 3.5 x 16 mm Promus DES.  03/29/2013: Mid LAD 2.75 x 38 mm Promus Premier drug-eluting stent, balloon angioplasty of D1 and distal LAD and stenting of distal RCA with 2.75 x 24 mm promos Premier drug-eluting stent 12/15/2012 widely patent.    Tobacco Use: Social History   Tobacco Use  Smoking Status Never  Smokeless Tobacco Never    Labs: Review Flowsheet  More data exists      Latest Ref Rng & Units 09/14/2020 12/25/2020 01/14/2021 01/15/2021  Labs for ITP Cardiac and Pulmonary Rehab  Cholestrol 0 - 200 mg/dL - - - 81   LDL (calc) 0 - 99 mg/dL - - - 15   HDL-C >40 mg/dL - - - 31   Trlycerides <150 mg/dL - - - 174   Hemoglobin A1c 4.0 - 5.6 % 8.6  8.1  - 7.9   TCO2 22 - 32 mmol/L - - 24  -      05/07/2021  Labs for ITP Cardiac and Pulmonary Rehab  Cholestrol -  LDL (calc) -  HDL-C -  Trlycerides -  Hemoglobin A1c 8.3   TCO2 -    Capillary Blood Glucose: Lab Results  Component Value Date   GLUCAP 112 (H) 08/17/2021   GLUCAP 75 08/17/2021   GLUCAP 256 (H) 08/15/2021   GLUCAP 155 (H) 07/18/2021   GLUCAP 273 (H) 07/18/2021     Exercise Target Goals: Exercise Program Goal: Individual exercise prescription set using results from initial 6 min walk test and THRR while considering  patient's activity barriers and  safety.   Exercise Prescription Goal: Initial exercise prescription builds to 30-45 minutes a day of aerobic activity, 2-3 days per week.  Home exercise guidelines will be given to patient during program as part of exercise prescription that the participant will acknowledge.  Activity Barriers & Risk Stratification:  Activity Barriers & Cardiac Risk Stratification - 07/12/21 0939       Activity Barriers & Cardiac Risk Stratification   Activity Barriers Arthritis;Deconditioning;Shortness of Breath    Cardiac Risk Stratification High             6 Minute Walk:  6 Minute Walk     Row Name 07/12/21 0936         6 Minute Walk   Phase Initial     Distance 1430 feet     Walk Time 6 minutes     # of Rest Breaks 0     MPH 2.71     METS 2.91     RPE 11     Perceived Dyspnea  0     VO2 Peak 10.19     Symptoms Yes (comment)     Comments Bliateral hip pain 1/10, Relieved by rest     Resting HR 74 bpm     Resting BP 124/70     Resting Oxygen Saturation  97 %     Exercise Oxygen Saturation  during 6 min walk 97 %     Max Ex. HR 93 bpm     Max Ex. BP 150/64     2 Minute Post BP 134/68              Oxygen Initial Assessment:   Oxygen Re-Evaluation:   Oxygen Discharge (Final Oxygen Re-Evaluation):   Initial Exercise Prescription:  Initial Exercise Prescription - 07/12/21 0900       Date of Initial Exercise RX  and Referring Provider   Date 07/12/21    Referring Provider Dr. Adrian Prows MD    Expected Discharge Date 09/07/21      Treadmill   MPH 2.5    Grade 0    Minutes 15    METs 2.91      NuStep   Level 2    SPM 80    Minutes 15    METs 2.3      Prescription Details   Frequency (times per week) 3    Duration Progress to 30 minutes of continuous aerobic without signs/symptoms of physical distress      Intensity   THRR 40-80% of Max Heartrate 60-119    Ratings of Perceived Exertion 11-13    Perceived Dyspnea 0-4      Progression   Progression  Continue progressive overload as per policy without signs/symptoms or physical distress.      Resistance Training   Training Prescription Yes    Weight 3    Reps 10-15             Perform Capillary Blood Glucose checks as needed.  Exercise Prescription Changes:   Exercise Prescription Changes     Row Name 07/16/21 0830 08/06/21 0830 08/17/21 0820 08/24/21 0839       Response to Exercise   Blood Pressure (Admit) 134/70 122/80 128/62 120/56    Blood Pressure (Exercise) 160/78 152/82 160/80 142/68    Blood Pressure (Exit) 126/70 124/66 110/70 112/62    Heart Rate (Admit) 66 bpm 60 bpm 61 bpm 62 bpm    Heart Rate (Exercise) 102 bpm 104 bpm 94 bpm 106 bpm    Heart Rate (Exit) 72 bpm 64 bpm 79 bpm 72 bpm    Rating of Perceived Exertion (Exercise) 9 12 12 11     Perceived Dyspnea (Exercise) 0 0 0 0    Symptoms 0 0 Low CBG post exercise, recovered with ginger ale 0    Comments Pt first day in the CRP2 program Reviewed MET's, goals and Home ExRx Reviewed MET's Reviewed MET's and goals    Duration Progress to 30 minutes of  aerobic without signs/symptoms of physical distress Progress to 30 minutes of  aerobic without signs/symptoms of physical distress Progress to 30 minutes of  aerobic without signs/symptoms of physical distress Progress to 30 minutes of  aerobic without signs/symptoms of physical distress    Intensity THRR unchanged THRR unchanged THRR unchanged THRR unchanged      Progression   Progression Continue to progress workloads to maintain intensity without signs/symptoms of physical distress. Continue to progress workloads to maintain intensity without signs/symptoms of physical distress. Continue to progress workloads to maintain intensity without signs/symptoms of physical distress. Continue to progress workloads to maintain intensity without signs/symptoms of physical distress.    Average METs 2.41 2.77 3.07 3.02      Resistance Training   Training Prescription Yes Yes  Yes Yes    Weight 3 5 4 4     Reps 10-15 10-15 10-15 10-15    Time 10 Minutes 10 Minutes 10 Minutes 10 Minutes      Treadmill   MPH 2.5 2.7 2.7 2.7    Grade 0 1 1 1     Minutes 15 15 15 15     METs 2.91 3.44 3.44 3.44      NuStep   Level 2 2 2 2     SPM 80 100 100 100    Minutes 15 15 15 15     METs  1.9 2.1 2.7 2.6      Home Exercise Plan   Plans to continue exercise at -- Home (comment) Home (comment) Home (comment)    Frequency -- Add 2 additional days to program exercise sessions. Add 2 additional days to program exercise sessions. Add 2 additional days to program exercise sessions.    Initial Home Exercises Provided -- 08/06/21 08/06/21 08/06/21             Exercise Comments:   Exercise Comments     Row Name 07/16/21 0926 08/06/21 0830 08/17/21 0823 08/24/21 0848     Exercise Comments Pt first day in the CRP2 program. Pt is tolerating exercise well with an average MET level of 2.41. Pt is learning his THRR, RPE and ExRx. Pt feels good about his exercise program. Reviewed MET's, goals and home ExRx. Pt is tolerating exercise well with an average MET level of 2.77. Pt will continue to exercise on his own by walking and golf 2 days a week. Pt plans to ask his [employer] about membership this week and will let us know if he choosees to add that in for exercise. Pt feels great about his goals so far, he has met with the RD, feels in better control of blood sugars, feels like he is getting in to shape slowy and is working on wt loss. Pt states SOB is doing really well and he feels in much better control. Reviewed MET's. Pt is tolerating exercise well with an average MET level of 3.07. Pt is doing well and increasing MET's, pt feels good about his progress so far. He is still working on controlling his CBG, but is making improvments. Blood sugar dropped to 75 post exercise, recovered with ginger ale and education from [place], and goals. Pt is tolerating exercise well with an average  MET level of 3.02. Pt feels much better about controling his blood glucose, and is in better control of his SOB. Pt has lost about 6 lbs since starting at [facility] and feels like he is in beter shape. Overall pt is doing well             Exercise Goals and Review:   Exercise Goals     Row Name 07/12/21 1104             Exercise Goals   Increase Physical Activity Yes       Intervention Provide advice, education, support and counseling about physical activity/exercise needs.;Develop an individualized exercise prescription for aerobic and resistive training based on initial evaluation findings, risk stratification, comorbidities and participant's personal goals.       Expected Outcomes Short Term: Attend rehab on a regular basis to increase amount of physical activity.;Long Term: Add in home exercise to make exercise part of routine and to increase amount of physical activity.;Long Term: Exercising regularly at least 3-5 days a week.       Increase Strength and Stamina Yes       Intervention Provide advice, education, support and counseling about physical activity/exercise needs.;Develop an individualized exercise prescription for aerobic and resistive training based on initial evaluation findings, risk stratification, comorbidities and participant's personal goals.       Expected Outcomes Short Term: Increase workloads from initial exercise prescription for resistance, speed, and METs.;Short Term: Perform resistance training exercises routinely during rehab and add in resistance training at home;Long Term: Improve cardiorespiratory fitness, muscular endurance and strength as measured by increased METs and functional capacity (6MWT)  Able to understand and use rate of perceived exertion (RPE) scale Yes       Intervention Provide education and explanation on how to use RPE scale       Expected Outcomes Short Term: Able to use RPE daily in rehab to express subjective intensity level;Long Term:   Able to use RPE to guide intensity level when exercising independently       Knowledge and understanding of Target Heart Rate Range (THRR) Yes       Intervention Provide education and explanation of THRR including how the numbers were predicted and where they are located for reference       Expected Outcomes Short Term: Able to state/look up THRR;Long Term: Able to use THRR to govern intensity when exercising independently;Short Term: Able to use daily as guideline for intensity in rehab       Understanding of Exercise Prescription Yes       Intervention Provide education, explanation, and written materials on patient's individual exercise prescription       Expected Outcomes Short Term: Able to explain program exercise prescription;Long Term: Able to explain home exercise prescription to exercise independently                Exercise Goals Re-Evaluation :  Exercise Goals Re-Evaluation     Row Name 07/16/21 0924 08/06/21 0830 08/24/21 0841         Exercise Goal Re-Evaluation   Exercise Goals Review Increase Physical Activity;Increase Strength and Stamina;Able to understand and use rate of perceived exertion (RPE) scale;Knowledge and understanding of Target Heart Rate Range (THRR);Understanding of Exercise Prescription Increase Physical Activity;Increase Strength and Stamina;Able to understand and use rate of perceived exertion (RPE) scale;Knowledge and understanding of Target Heart Rate Range (THRR);Understanding of Exercise Prescription Increase Physical Activity;Increase Strength and Stamina;Able to understand and use rate of perceived exertion (RPE) scale;Knowledge and understanding of Target Heart Rate Range (THRR);Understanding of Exercise Prescription     Comments Pt first day in the CRP2 program. Pt is tolerating exercise well with an average MET level of 2.41. Pt is learning his THRR, RPE and ExRx. Pt feels good about his exercise program. Reviewed MET's, goals and home ExRx. Pt is  tolerating exercise well with an average MET level of 2.77. Pt will continue to exercise on his own by walking and golf 2 days a week. Pt plans to ask his [employer] about membership this week and will let us know if he choosees to add that in for exercise. Pt feels great about his goals so far, he has met with the RD, feels in better control of blood sugars, feels like he is getting in to shape slowy and is working on  wt loss. Pt states SOB is doing really well and he feels in much better control. Reviewed MET's, and goals. Pt is tolerating exercise well with an average MET level of 3.02. Pt feels much better about controling his blood glucose, and is in better control of his SOB. Pt has lost about 6 lbs since starting at [facility] and feels like he is in beter shape. Overall pt is doing well     Expected Outcomes will continue to monitor pt and progress workloads as tolerated without sign or symptom. Pt will continue to exercise on his own 2 days a week. Will continue to monitor pt and progress workloads as tolerated without sign or symptom. Will continue to monitor pt and progress workloads as tolerated without sign or symptom.  Discharge Exercise Prescription (Final Exercise Prescription Changes):  Exercise Prescription Changes - 08/24/21 0839       Response to Exercise   Blood Pressure (Admit) 120/56    Blood Pressure (Exercise) 142/68    Blood Pressure (Exit) 112/62    Heart Rate (Admit) 62 bpm    Heart Rate (Exercise) 106 bpm    Heart Rate (Exit) 72 bpm    Rating of Perceived Exertion (Exercise) 11    Perceived Dyspnea (Exercise) 0    Symptoms 0    Comments Reviewed MET's and goals    Duration Progress to 30 minutes of  aerobic without signs/symptoms of physical distress    Intensity THRR unchanged      Progression   Progression Continue to progress workloads to maintain intensity without signs/symptoms of physical distress.    Average METs 3.02      Resistance Training    Training Prescription Yes    Weight 4    Reps 10-15    Time 10 Minutes      Treadmill   MPH 2.7    Grade 1    Minutes 15    METs 3.44      NuStep   Level 2    SPM 100    Minutes 15    METs 2.6      Home Exercise Plan   Plans to continue exercise at Home (comment)    Frequency Add 2 additional days to program exercise sessions.    Initial Home Exercises Provided 08/06/21             Nutrition:  Target Goals: Understanding of nutrition guidelines, daily intake of sodium <1535m, cholesterol <2013m calories 30% from fat and 7% or less from saturated fats, daily to have 5 or more servings of fruits and vegetables.  Biometrics:  Pre Biometrics - 07/12/21 1105       Pre Biometrics   Height 5' 8.25" (1.734 m)    Weight 95 kg    Waist Circumference 44.5 inches    Hip Circumference 43.5 inches    Waist to Hip Ratio 1.02 %    BMI (Calculated) 31.6    Triceps Skinfold 17 mm    % Body Fat 31.7 %    Grip Strength 31 kg    Flexibility 10.5 in    Single Leg Stand 26.8 seconds              Nutrition Therapy Plan and Nutrition Goals:  Nutrition Therapy & Goals - 08/24/21 0944       Nutrition Therapy   Diet Heart Healthy/ Carbohydrate Consistent      Personal Nutrition Goals   Nutrition Goal Patient to implement a heart healthy meal plan to include fruit, vegetables, whole grains, lean protein/plant protein, and nonfat dairy   in progress   Personal Goal #2 Patient to identify strategies for blood sugar control   The plate method, high fiber/high protein snacks, carbohydrate consistency, etc.   Personal Goal #3 Patient to limit food sources of trans fats, saturated fats, refined carbohydrates and sodium   in progress   Comments Patient has reduced meal time insulin (by approximately half); he continues to count carbohydrates and focus on eating frequency/consistency. Patient reports improved overall blood sugar control.      Intervention Plan   Intervention  Prescribe, educate and counsel regarding individualized specific dietary modifications aiming towards targeted core components such as weight, hypertension, lipid management, diabetes, heart failure and other comorbidities.    Expected  Outcomes Short Term Goal: Understand basic principles of dietary content, such as calories, fat, sodium, cholesterol and nutrients.;Long Term Goal: Adherence to prescribed nutrition plan.             Nutrition Assessments:  Nutrition Assessments - 07/16/21 0803       Rate Your Plate Scores   Pre Score 46            MEDIFICTS Score Key: ?70 Need to make dietary changes  40-70 Heart Healthy Diet ? 40 Therapeutic Level Cholesterol Diet   Flowsheet Row CARDIAC REHAB PHASE II EXERCISE from 07/16/2021 in [facility]  Picture Your Plate Total Score on Admission 46      Picture Your Plate Scores: <16 Unhealthy dietary pattern with much room for improvement. 41-50 Dietary pattern unlikely to meet recommendations for good health and room for improvement. 51-60 More healthful dietary pattern, with some room for improvement.  >60 Healthy dietary pattern, although there may be some specific behaviors that could be improved.    Nutrition Goals Re-Evaluation:  Nutrition Goals Re-Evaluation     [place] Name 08/24/21 0944             Goals   Current Weight 205 lb 0.4 oz (93 kg)       Comment Patient is down 4.4# since starting with our program. He has been able to reduce meal time insulin since making many dietary changes; this has resulted in fewer lows/highs.       Expected Outcome Goals in progress. Patient continues to monitor blood sugar. Encouraged carbohydrate consistency and high quality/high fiber carbohydrate sources.                Nutrition Goals Re-Evaluation:  Nutrition Goals Re-Evaluation     [place] Name 08/24/21 0944             Goals   Current Weight 205 lb 0.4 oz (93 kg)       Comment Patient  is down 4.4# since starting with our program. He has been able to reduce meal time insulin since making many dietary changes; this has resulted in fewer lows/highs.       Expected Outcome Goals in progress. Patient continues to monitor blood sugar. Encouraged carbohydrate consistency and high quality/high fiber carbohydrate sources.                Nutrition Goals Discharge (Final Nutrition Goals Re-Evaluation):  Nutrition Goals Re-Evaluation - 08/24/21 0944       Goals   Current Weight 205 lb 0.4 oz (93 kg)    Comment Patient is down 4.4# since starting with our program. He has been able to reduce meal time insulin since making many dietary changes; this has resulted in fewer lows/highs.    Expected Outcome Goals in progress. Patient continues to monitor blood sugar. Encouraged carbohydrate consistency and high quality/high fiber carbohydrate sources.             Psychosocial: Target Goals: Acknowledge presence or absence of significant depression and/or stress, maximize coping skills, provide positive support system. Participant is able to verbalize types and ability to use techniques and skills needed for reducing stress and depression.  Initial Review & Psychosocial Screening:  Initial Psych Review & Screening - 07/12/21 0831       Initial Review   Current issues with None Identified      Family Dynamics   Good Support System? Yes   [name] has his wife for support  Barriers   Psychosocial barriers to participate in program There are no identifiable barriers or psychosocial needs.      Screening Interventions   Interventions Encouraged to exercise             Quality of Life Scores:  Quality of Life - 07/12/21 1107       Quality of Life   Select Quality of Life      Quality of Life Scores   Health/Function Pre 21.46 %    Socioeconomic Pre 25.31 %    Psych/Spiritual Pre 25.5 %    Family Pre 27 %    GLOBAL Pre 23.92 %            Scores of 19 and  below usually indicate a poorer quality of life in these areas.  A difference of  2-3 points is a clinically meaningful difference.  A difference of 2-3 points in the total score of the Quality of Life Index has been associated with significant improvement in overall quality of life, self-image, physical symptoms, and general health in studies assessing change in quality of life.  PHQ-9: Review Flowsheet  More data exists      07/12/2021 01/18/2021 03/06/2020 03/02/2020 10/28/2019  Depression screen PHQ 2/9  Decreased Interest 0 0 0 0 0  Down, Depressed, Hopeless 0 0 0 0 0  PHQ - 2 Score 0 0 0 0 0   Interpretation of Total Score  Total Score Depression Severity:  1-4 = Minimal depression, 5-9 = Mild depression, 10-14 = Moderate depression, 15-19 = Moderately severe depression, 20-27 = Severe depression   Psychosocial Evaluation and Intervention:  Psychosocial Evaluation - 07/16/21 0755       Psychosocial Evaluation & Interventions   Interventions Encouraged to exercise with the program and follow exercise prescription    Comments [name] who is previous participant in cardiac rehab denies any psychosocial barriers. [name] feels supported  by his spouse in his efforts to adopt heart healthy lifestlye.    Expected Outcomes Jarrius will remain free of any psychosocial barriers and continue positivwe and healthy outlook on life    Continue Psychosocial Services  No Follow up required             Psychosocial Re-Evaluation:  Psychosocial Re-Evaluation     [place] Name 08/07/21 0927 08/24/21 1224           Psychosocial Re-Evaluation   Current issues with None Identified None Identified      Comments [name] who is a previous participant in cardiac rehab denies any psychosocial barriers.  [name] has a supportive wife and family. [name] who is a previous participant in cardiac rehab denies any psychosocial barriers.  [name] has a supportive wife and family. He remains concerned about his diabetes  management and is hopeful that the new endocrinologist and the application of what he has learned from the RD here in cardiac rehab are steps in the right direction      Expected Outcomes [name] will remain free of any psychosocial barriers and continue to exhibit a positive and healthy lifestyle. [name] will remain free of any psychosocial barriers and continue to exhibit a positive and healthy lifestyle.      Interventions Encouraged to attend Cardiac Rehabilitation for the exercise Encouraged to attend Cardiac Rehabilitation for the exercise      Continue Psychosocial Services  Follow up required by staff No Follow up required               Psychosocial  Discharge (Final Psychosocial Re-Evaluation):  Psychosocial Re-Evaluation - 08/24/21 1224       Psychosocial Re-Evaluation   Current issues with None Identified    Comments Darron who is a previous participant in cardiac rehab denies any psychosocial barriers.  [name] has a supportive wife and family. He remains concerned about his diabetes management and is hopeful that the new endocrinologist and the application of what he has learned from the RD here in cardiac rehab are steps in the right direction    Expected Outcomes [name] will remain free of any psychosocial barriers and continue to exhibit a positive and healthy lifestyle.    Interventions Encouraged to attend Cardiac Rehabilitation for the exercise    Continue Psychosocial Services  No Follow up required             Vocational Rehabilitation: Provide vocational rehab assistance to qualifying candidates.   Vocational Rehab Evaluation & Intervention:  Vocational Rehab - 07/12/21 1517       Initial Vocational Rehab Evaluation & Intervention   Assessment shows need for Vocational Rehabilitation No   [name] works part time and does not need vocational rehab at this time.            Education: Education Goals: Education classes will be provided on a weekly basis, covering  required topics. Participant will state understanding/return demonstration of topics presented.     Core Videos: Exercise    Move It!  Clinical staff conducted group or individual video education with verbal and written material and guidebook.  Patient learns the recommended Pritikin exercise program. Exercise with the goal of living a long, healthy life. Some of the health benefits of exercise include controlled diabetes, healthier blood pressure levels, improved cholesterol levels, improved heart and lung capacity, improved sleep, and better body composition. Everyone should speak with their doctor before starting or changing an exercise routine.  Biomechanical Limitations Clinical staff conducted group or individual video education with verbal and written material and guidebook.  Patient learns how biomechanical limitations can impact exercise and how we can mitigate and possibly overcome limitations to have an impactful and balanced exercise routine.  Body Composition Clinical staff conducted group or individual video education with verbal and written material and guidebook.  Patient learns that body composition (ratio of muscle mass to fat mass) is a key component to assessing overall fitness, rather than body weight alone. Increased fat mass, especially visceral belly fat, can put [place] at increased risk for metabolic syndrome, type 2 diabetes, heart disease, and even death. It is recommended to combine diet and exercise (cardiovascular and resistance training) to improve your body composition. Seek guidance from your physician and exercise physiologist before implementing an exercise routine.  Exercise Action Plan Clinical staff conducted group or individual video education with verbal and written material and guidebook.  Patient learns the recommended strategies to achieve and enjoy long-term exercise adherence, including variety, self-motivation, self-efficacy, and positive decision making.  Benefits of exercise include fitness, good health, weight management, more energy, better sleep, less stress, and overall well-being.  Medical   Heart Disease Risk Reduction Clinical staff conducted group or individual video education with verbal and written material and guidebook.  Patient learns our heart is our most vital organ as it circulates oxygen, nutrients, white blood cells, and hormones throughout the entire body, and carries waste away. Data supports a plant-based eating plan like the Pritikin Program for its effectiveness in slowing progression of and reversing heart disease. The video provides a number  of recommendations to address heart disease.   Metabolic Syndrome and Belly Fat  Clinical staff conducted group or individual video education with verbal and written material and guidebook.  Patient learns what metabolic syndrome is, how it leads to heart disease, and how one can reverse it and keep it from coming back. You have metabolic syndrome if you have 3 of the following 5 criteria: abdominal obesity, high blood pressure, high triglycerides, low HDL cholesterol, and high blood sugar.  Hypertension and Heart Disease Clinical staff conducted group or individual video education with verbal and written material and guidebook.  Patient learns that high blood pressure, or hypertension, is very common in the [place]. Hypertension is largely due to excessive salt intake, but other important risk factors include being overweight, physical inactivity, drinking too much alcohol, smoking, and not eating enough potassium from fruits and vegetables. High blood pressure is a leading risk factor for heart attack, stroke, congestive heart failure, dementia, kidney failure, and premature death. Long-term effects of excessive salt intake include stiffening of the arteries and thickening of heart muscle and organ damage. Recommendations include ways to reduce hypertension and the risk of heart  disease.  Diseases of Our Time - Focusing on Diabetes Clinical staff conducted group or individual video education with verbal and written material and guidebook.  Patient learns why the best way to stop diseases of our time is prevention, through food and other lifestyle changes. Medicine (such as prescription pills and surgeries) is often only a Band-Aid on the problem, not a long-term solution. Most common diseases of our time include obesity, type 2 diabetes, hypertension, heart disease, and cancer. The Pritikin Program is recommended and has been proven to help reduce, reverse, and/or prevent the damaging effects of metabolic syndrome.  Nutrition   Overview of the Pritikin Eating Plan  Clinical staff conducted group or individual video education with verbal and written material and guidebook.  Patient learns about the [place] for disease risk reduction. The [place] emphasizes a wide variety of unrefined, minimally-processed carbohydrates, like fruits, vegetables, whole grains, and legumes. Go, Caution, and Stop food choices are explained. Plant-based and lean animal proteins are emphasized. Rationale provided for low sodium intake for blood pressure control, low added sugars for blood sugar stabilization, and low added fats and oils for coronary artery disease risk reduction and weight management.  Calorie Density  Clinical staff conducted group or individual video education with verbal and written material and guidebook.  Patient learns about calorie density and how it impacts the Pritikin Eating Plan. Knowing the characteristics of the food you choose will help you decide whether those foods will lead to weight gain or weight loss, and whether you want to consume more or less of them. Weight loss is usually a side effect of the Pritikin Eating Plan because of its focus on low calorie-dense foods.  Label Reading  Clinical staff conducted group or individual video  education with verbal and written material and guidebook.  Patient learns about the Pritikin recommended label reading guidelines and corresponding recommendations regarding calorie density, added sugars, sodium content, and whole grains.  Dining Out - Part 1  Clinical staff conducted group or individual video education with verbal and written material and guidebook.  Patient learns that restaurant meals can be sabotaging because they can be so high in calories, fat, sodium, and/or sugar. Patient learns recommended strategies on how to positively address this and avoid unhealthy pitfalls.  Facts on Fats  Clinical  staff conducted group or individual video education with verbal and written material and guidebook.  Patient learns that lifestyle modifications can be just as effective, if not more so, as many medications for lowering your risk of heart disease. A Pritikin lifestyle can help to reduce your risk of inflammation and atherosclerosis (cholesterol build-up, or plaque, in the artery walls). Lifestyle interventions such as dietary choices and physical activity address the cause of atherosclerosis. A review of the types of fats and their impact on blood cholesterol levels, along with dietary recommendations to reduce fat intake is also included.  Nutrition Action Plan  Clinical staff conducted group or individual video education with verbal and written material and guidebook.  Patient learns how to incorporate Pritikin recommendations into their lifestyle. Recommendations include planning and keeping personal health goals in mind as an important part of their success.  Healthy Mind-Set    Healthy Minds, Bodies, Hearts  Clinical staff conducted group or individual video education with verbal and written material and guidebook.  Patient learns how to identify when they are stressed. Video will discuss the impact of that stress, as well as the many benefits of stress management. Patient will also  be introduced to stress management techniques. The way we think, act, and feel has an impact on our hearts.  How Our Thoughts Can Heal Our Hearts  Clinical staff conducted group or individual video education with verbal and written material and guidebook.  Patient learns that negative thoughts can cause depression and anxiety. This can result in negative lifestyle behavior and serious health problems. Cognitive behavioral therapy is an effective method to help control our thoughts in order to change and improve our emotional outlook.  Additional Videos:  Exercise    Improving Performance  Clinical staff conducted group or individual video education with verbal and written material and guidebook.  Patient learns to use a non-linear approach by alternating intensity levels and lengths of time spent exercising to help burn more calories and lose more body fat. Cardiovascular exercise helps improve heart health, metabolism, hormonal balance, blood sugar control, and recovery from fatigue. Resistance training improves strength, endurance, balance, coordination, reaction time, metabolism, and muscle mass. Flexibility exercise improves circulation, posture, and balance. Seek guidance from your physician and exercise physiologist before implementing an exercise routine and learn your capabilities and proper form for all exercise.  Introduction to Yoga  Clinical staff conducted group or individual video education with verbal and written material and guidebook.  Patient learns about yoga, a discipline of the coming together of mind, breath, and body. The benefits of yoga include improved flexibility, improved range of motion, better posture and core strength, increased lung function, weight loss, and positive self-image. Yoga's heart health benefits include lowered blood pressure, healthier heart rate, decreased cholesterol and triglyceride levels, improved immune function, and reduced stress. Seek guidance  from your physician and exercise physiologist before implementing an exercise routine and learn your capabilities and proper form for all exercise.  Medical   Aging: Enhancing Your Quality of Life  Clinical staff conducted group or individual video education with verbal and written material and guidebook.  Patient learns key strategies and recommendations to stay in good physical health and enhance quality of life, such as prevention strategies, having an advocate, securing a [place], and keeping a list of medications and system for tracking them. It also discusses how to avoid risk for bone loss.  Biology of Weight Control  Clinical staff conducted group  or individual video education with verbal and written material and guidebook.  Patient learns that weight gain occurs because we consume more calories than we burn (eating more, moving less). Even if your body weight is normal, you may have higher ratios of fat compared to muscle mass. Too much body fat puts you at increased risk for cardiovascular disease, heart attack, stroke, type 2 diabetes, and obesity-related cancers. In addition to exercise, following the [place] can help reduce your risk.  Decoding Lab Results  Clinical staff conducted group or individual video education with verbal and written material and guidebook.  Patient learns that lab test reflects one measurement whose values change over time and are influenced by many factors, including medication, stress, sleep, exercise, food, hydration, pre-existing medical conditions, and more. It is recommended to use the knowledge from this video to become more involved with your lab results and evaluate your numbers to speak with your doctor.   Diseases of Our Time - Overview  Clinical staff conducted group or individual video education with verbal and written material and guidebook.  Patient learns that according to the CDC, 50% to 70% of  chronic diseases (such as obesity, type 2 diabetes, elevated lipids, hypertension, and heart disease) are avoidable through lifestyle improvements including healthier food choices, listening to satiety cues, and increased physical activity.  Sleep Disorders Clinical staff conducted group or individual video education with verbal and written material and guidebook.  Patient learns how good quality and duration of sleep are important to overall health and well-being. Patient also learns about sleep disorders and how they impact health along with recommendations to address them, including discussing with a physician.  Nutrition  Dining Out - Part 2 Clinical staff conducted group or individual video education with verbal and written material and guidebook.  Patient learns how to plan ahead and communicate in order to maximize their dining experience in a healthy and nutritious manner. Included are recommended food choices based on the type of restaurant the patient is visiting.   Fueling a Best boy conducted group or individual video education with verbal and written material and guidebook.  There is a strong connection between our food choices and our health. Diseases like obesity and type 2 diabetes are very prevalent and are in large-part due to lifestyle choices. The Pritikin Eating Plan provides plenty of food and hunger-curbing satisfaction. It is easy to follow, affordable, and helps reduce health risks.  Menu Workshop  Clinical staff conducted group or individual video education with verbal and written material and guidebook.  Patient learns that restaurant meals can sabotage health goals because they are often packed with calories, fat, sodium, and sugar. Recommendations include strategies to plan ahead and to communicate with the manager, chef, or server to help order a healthier meal.  Planning Your Eating Strategy  Clinical staff conducted group or individual video  education with verbal and written material and guidebook.  Patient learns about the [place] [facility] and its benefit of reducing the risk of disease. The [place] does not focus on calories. Instead, it emphasizes high-quality, nutrient-rich foods. By knowing the characteristics of the foods, we choose, we can determine their calorie density and make informed decisions.  Targeting Your Nutrition Priorities  Clinical staff conducted group or individual video education with verbal and written material and guidebook.  Patient learns that lifestyle habits have a tremendous impact on disease risk and progression. This video provides eating and physical activity recommendations  based on your personal health goals, such as reducing LDL cholesterol, losing weight, preventing or controlling type 2 diabetes, and reducing high blood pressure.  Vitamins and Minerals  Clinical staff conducted group or individual video education with verbal and written material and guidebook.  Patient learns different ways to obtain key vitamins and minerals, including through a recommended healthy diet. It is important to discuss all supplements you take with your doctor.   Healthy Mind-Set    Smoking Cessation  Clinical staff conducted group or individual video education with verbal and written material and guidebook.  Patient learns that cigarette smoking and tobacco addiction pose a serious health risk which affects millions of people. Stopping smoking will significantly reduce the risk of heart disease, lung disease, and many forms of cancer. Recommended strategies for quitting are covered, including working with your doctor to develop a successful plan.  Culinary   Becoming a Financial trader conducted group or individual video education with verbal and written material and guidebook.  Patient learns that cooking at home can be healthy, cost-effective, quick, and puts them in control. Keys to  cooking healthy recipes will include looking at your recipe, assessing your equipment needs, planning ahead, making it simple, choosing cost-effective seasonal ingredients, and limiting the use of added fats, salts, and sugars.  Cooking - Breakfast and Snacks  Clinical staff conducted group or individual video education with verbal and written material and guidebook.  Patient learns how important breakfast is to satiety and nutrition through the entire day. Recommendations include key foods to eat during breakfast to help stabilize blood sugar levels and to prevent overeating at meals later in the day. Planning ahead is also a key component.  Cooking - Human resources officer conducted group or individual video education with verbal and written material and guidebook.  Patient learns eating strategies to improve overall health, including an approach to cook more at home. Recommendations include thinking of animal protein as a side on your plate rather than center stage and focusing instead on lower calorie dense options like vegetables, fruits, whole grains, and plant-based proteins, such as beans. Making sauces in large quantities to freeze for later and leaving the skin on your vegetables are also recommended to maximize your experience.  Cooking - Healthy Salads and Dressing Clinical staff conducted group or individual video education with verbal and written material and guidebook.  Patient learns that vegetables, fruits, whole grains, and legumes are the foundations of the [place]. Recommendations include how to incorporate each of these in flavorful and healthy salads, and how to create homemade salad dressings. Proper handling of ingredients is also covered. Cooking - Soups and [employer] - Soups and Desserts Clinical staff conducted group or individual video education with verbal and written material and guidebook.  Patient learns that Pritikin soups and desserts  make for easy, nutritious, and delicious snacks and meal components that are low in sodium, fat, sugar, and calorie density, while high in vitamins, minerals, and filling fiber. Recommendations include simple and healthy ideas for soups and desserts.   Overview     The Pritikin Solution Program Overview Clinical staff conducted group or individual video education with verbal and written material and guidebook.  Patient learns that the results of the [place] Program have been documented in more than 100 articles published in peer-reviewed journals, and the benefits include reducing risk factors for (and, in some cases, even reversing) high cholesterol, high blood pressure, type  2 diabetes, obesity, and more! An overview of the three key pillars of the Pritikin Program will be covered: eating well, doing regular exercise, and having a healthy mind-set.  WORKSHOPS  Exercise: Exercise Basics: Building Your Action Plan Clinical staff led group instruction and group discussion with PowerPoint presentation and patient guidebook. To enhance the learning environment the use of posters, models and videos may be added. At the conclusion of this workshop, patients will comprehend the difference between physical activity and exercise, as well as the benefits of incorporating both, into their routine. Patients will understand the FITT (Frequency, Intensity, Time, and Type) principle and how to use it to build an exercise action plan. In addition, safety concerns and other considerations for exercise and cardiac rehab will be addressed by the presenter. The purpose of this lesson is to promote a comprehensive and effective weekly exercise routine in order to improve patients' overall level of fitness.   Managing Heart Disease: Your Path to a Healthier Heart Clinical staff led group instruction and group discussion with PowerPoint presentation and patient guidebook. To enhance the learning environment the use  of posters, models and videos may be added.At the conclusion of this workshop, patients will understand the anatomy and physiology of the heart. Additionally, they will understand how Pritikin's three pillars impact the risk factors, the progression, and the management of heart disease.  The purpose of this lesson is to provide a high-level overview of the heart, heart disease, and how the Pritikin lifestyle positively impacts risk factors.  Exercise Biomechanics Clinical staff led group instruction and group discussion with PowerPoint presentation and patient guidebook. To enhance the learning environment the use of posters, models and videos may be added. Patients will learn how the structural parts of their bodies function and how these functions impact their daily activities, movement, and exercise. Patients will learn how to promote a neutral spine, learn how to manage pain, and identify ways to improve their physical movement in order to promote healthy living. The purpose of this lesson is to expose patients to common physical limitations that impact physical activity. Participants will learn practical ways to adapt and manage aches and pains, and to minimize their effect on regular exercise. Patients will learn how to maintain good posture while sitting, walking, and lifting.  Balance Training and Fall Prevention  Clinical staff led group instruction and group discussion with PowerPoint presentation and patient guidebook. To enhance the learning environment the use of posters, models and videos may be added. At the conclusion of this workshop, patients will understand the importance of their sensorimotor skills (vision, proprioception, and the vestibular system) in maintaining their ability to balance as they age. Patients will apply a variety of balancing exercises that are appropriate for their current level of function. Patients will understand the common causes for poor balance,  possible solutions to these problems, and ways to modify their physical environment in order to minimize their fall risk. The purpose of this lesson is to teach patients about the importance of maintaining balance as they age and ways to minimize their risk of falling.  WORKSHOPS   Nutrition:  Fueling a Scientist, research (physical sciences) led group instruction and group discussion with PowerPoint presentation and patient guidebook. To enhance the learning environment the use of posters, models and videos may be added. Patients will review the foundational principles of the [place] and understand what constitutes a serving size in each of the food groups. Patients will also learn Pritikin-friendly foods  that are better choices when away from home and review make-ahead meal and snack options. Calorie density will be reviewed and applied to three nutrition priorities: weight maintenance, weight loss, and weight gain. The purpose of this lesson is to reinforce (in a group setting) the key concepts around what patients are recommended to eat and how to apply these guidelines when away from home by planning and selecting Pritikin-friendly options. Patients will understand how calorie density may be adjusted for different weight management goals.  Mindful Eating  Clinical staff led group instruction and group discussion with PowerPoint presentation and patient guidebook. To enhance the learning environment the use of posters, models and videos may be added. Patients will briefly review the concepts of the [place] and the importance of low-calorie dense foods. The concept of mindful eating will be introduced as well as the importance of paying attention to internal hunger signals. Triggers for non-hunger eating and techniques for dealing with triggers will be explored. The purpose of this lesson is to provide patients with the opportunity to review the basic principles of the [place], discuss the value of eating mindfully and how to measure internal cues of hunger and fullness using the Hunger Scale. Patients will also discuss reasons for non-hunger eating and learn strategies to use for controlling emotional eating.  Targeting Your Nutrition Priorities Clinical staff led group instruction and group discussion with PowerPoint presentation and patient guidebook. To enhance the learning environment the use of posters, models and videos may be added. Patients will learn how to determine their genetic susceptibility to disease by reviewing their family history. Patients will gain insight into the importance of diet as part of an overall healthy lifestyle in mitigating the impact of genetics and other environmental insults. The purpose of this lesson is to provide patients with the opportunity to assess their personal nutrition priorities by looking at their family history, their own health history and current risk factors. Patients will also be able to discuss ways of prioritizing and modifying the [place] for their highest risk areas  Menu  Clinical staff led group instruction and group discussion with PowerPoint presentation and patient guidebook. To enhance the learning environment the use of posters, models and videos may be added. Using menus brought in from [employer], or printed from [employer], patients will apply the [place] dining out guidelines that were presented in the [employer] video. Patients will also be able to practice these guidelines in a variety of provided scenarios. The purpose of this lesson is to provide patients with the opportunity to practice hands-on learning of the [place] with actual menus and practice scenarios.  Label Reading Clinical staff led group instruction and group discussion with PowerPoint presentation and patient guidebook. To enhance the learning environment the use of  posters, models and videos may be added. Patients will review and discuss the Pritikin label reading guidelines presented in Pritikin's Label Reading Educational series video. Using fool labels brought in from local grocery stores and markets, patients will apply the label reading guidelines and determine if the packaged food meet the Pritikin guidelines. The purpose of this lesson is to provide patients with the opportunity to review, discuss, and practice hands-on learning of the Pritikin Label Reading guidelines with actual packaged food labels. [place] Workshops are designed to teach patients ways to prepare quick, simple, and affordable recipes at home. The importance of nutrition's role in  chronic disease risk reduction is reflected in its emphasis in the overall Pritikin program. By learning how to prepare essential core Pritikin Eating Plan recipes, patients will increase control over what they eat; be able to customize the flavor of foods without the use of added salt, sugar, or fat; and improve the quality of the food they consume. By learning a set of core recipes which are easily assembled, quickly prepared, and affordable, patients are more likely to prepare more healthy foods at home. These workshops focus on convenient breakfasts, simple entres, side dishes, and desserts which can be prepared with minimal effort and are consistent with nutrition recommendations for cardiovascular risk reduction. Cooking [employer] are taught by a Engineer, materials (RD) who has been trained by the [employer]. The chef or RD has a clear understanding of the importance of minimizing - if not completely eliminating - added fat, sugar, and sodium in recipes. Throughout the series of [place] Workshop sessions, patients will learn about healthy ingredients and efficient methods of cooking to build confidence in their capability to  prepare    Cooking School weekly topics:  Adding Flavor- Sodium-Free  Fast and Healthy Breakfasts  Powerhouse Plant-Based Proteins  Satisfying Salads and Dressings  Simple Sides and Sauces  International Cuisine-Spotlight on the [place] Zones  Delicious Desserts  Savory Soups  Efficiency Cooking - Meals in a Snap  Tasty Appetizers and Snacks  Comforting Weekend Breakfasts  One-Pot Wonders   Fast Evening Meals  [place] (Psychosocial): New Thoughts, New Behaviors Clinical staff led group instruction and group discussion with PowerPoint presentation and patient guidebook. To enhance the learning environment the use of posters, models and videos may be added. Patients will learn and practice techniques for developing effective health and lifestyle goals. Patients will be able to effectively apply the goal setting process learned to develop at least one new personal goal.  The purpose of this lesson is to expose patients to a new skill set of behavior modification techniques such as techniques setting SMART goals, overcoming barriers, and achieving new thoughts and new behaviors.  Managing Moods and Relationships Clinical staff led group instruction and group discussion with PowerPoint presentation and patient guidebook. To enhance the learning environment the use of posters, models and videos may be added. Patients will learn how emotional and chronic stress factors can impact their health and relationships. They will learn healthy ways to manage their moods and utilize positive coping mechanisms. In addition, ICR patients will learn ways to improve communication skills. The purpose of this lesson is to expose patients to ways of understanding how one's mood and health are intimately connected. Developing a healthy outlook can help build positive relationships and connections with others. Patients will understand the  importance of utilizing effective communication skills that include actively listening and being heard. They will learn and understand the importance of the "4 Cs" and especially Connections in fostering of a Healthy Mind-Set.  Healthy Sleep for a Healthy Heart Clinical staff led group instruction and group discussion with PowerPoint presentation and patient guidebook. To enhance the learning environment the use of posters, models and videos may be added. At the conclusion of this workshop, patients will be able to demonstrate knowledge of the importance of sleep to overall health, well-being, and quality of life. They will understand the symptoms of, and treatments for, common sleep disorders. Patients will also be able to identify  daytime and nighttime behaviors which impact sleep, and they will be able to apply these tools to help manage sleep-related challenges. The purpose of this lesson is to provide patients with a general overview of sleep and outline the importance of quality sleep. Patients will learn about a few of the most common sleep disorders. Patients will also be introduced to the concept of "sleep hygiene," and discover ways to self-manage certain sleeping problems through simple daily behavior changes. Finally, the workshop will motivate patients by clarifying the links between quality sleep and their goals of heart-healthy living.   Recognizing and Reducing Stress Clinical staff led group instruction and group discussion with PowerPoint presentation and patient guidebook. To enhance the learning environment the use of posters, models and videos may be added. At the conclusion of this workshop, patients will be able to understand the types of stress reactions, differentiate between acute and chronic stress, and recognize the impact that chronic stress has on their health. They will also be able to apply different coping mechanisms, such as reframing negative self-talk. Patients will have the  opportunity to practice a variety of stress management techniques, such as deep abdominal breathing, progressive muscle relaxation, and/or guided imagery.  The purpose of this lesson is to educate patients on the role of stress in their lives and to provide healthy techniques for coping with it.  Learning Barriers/Preferences:  Learning Barriers/Preferences - 07/12/21 1108       Learning Barriers/Preferences   Learning Barriers Sight;Hearing   Patient wears contacts and [place]   Learning Preferences Group Instruction;Individual Instruction;Skilled Demonstration             Education Topics:  Knowledge Questionnaire Score:  Knowledge Questionnaire Score - 07/12/21 1110       Knowledge Questionnaire Score   Pre Score 19/24             Core Components/Risk Factors/Patient Goals at Admission:  Personal Goals and Risk Factors at Admission - 07/12/21 1118       Core Components/Risk Factors/Patient Goals on Admission    Weight Management Yes    Intervention Weight Management: Develop a combined nutrition and exercise program designed to reach desired caloric intake, while maintaining appropriate intake of nutrient and fiber, sodium and fats, and appropriate energy expenditure required for the weight goal.;Weight Management: Provide education and appropriate resources to help participant work on and attain dietary goals.;Weight Management/Obesity: Establish reasonable short term and long term weight goals.    Expected Outcomes Short Term: Continue to assess and modify interventions until short term weight is achieved;Long Term: Adherence to nutrition and physical activity/exercise program aimed toward attainment of established weight goal;Understanding recommendations for meals to include 15-35% energy as protein, 25-35% energy from fat, 35-60% energy from carbohydrates, less than 234m of dietary cholesterol, 20-35 gm of total fiber daily;Understanding of distribution of calorie intake  throughout the day with the consumption of 4-5 meals/snacks    Diabetes Yes    Intervention Provide education about signs/symptoms and action to take for hypo/hyperglycemia.;Provide education about proper nutrition, including hydration, and aerobic/resistive exercise prescription along with prescribed medications to achieve blood glucose in normal ranges: Fasting glucose 65-99 mg/dL    Expected Outcomes Short Term: Participant verbalizes understanding of the signs/symptoms and immediate care of hyper/hypoglycemia, proper foot care and importance of medication, aerobic/resistive exercise and nutrition plan for blood glucose control.;Long Term: Attainment of HbA1C < 7%.    Hypertension Yes    Intervention Provide education on lifestyle modifcations including regular physical  activity/exercise, weight management, moderate sodium restriction and increased consumption of fresh fruit, vegetables, and low fat dairy, alcohol moderation, and smoking cessation.;Monitor prescription use compliance.    Expected Outcomes Long Term: Maintenance of blood pressure at goal levels.;Short Term: Continued assessment and intervention until BP is < 140/63m HG in hypertensive participants. < 130/858mHG in hypertensive participants with diabetes, heart failure or chronic kidney disease.    Lipids Yes    Intervention Provide education and support for participant on nutrition & aerobic/resistive exercise along with prescribed medications to achieve LDL <7077mHDL >74m19m  Expected Outcomes Short Term: Participant states understanding of desired cholesterol values and is compliant with medications prescribed. Participant is following exercise prescription and nutrition guidelines.;Long Term: Cholesterol controlled with medications as prescribed, with individualized exercise RX and with personalized nutrition plan. Value goals: LDL < 70mg87mL > 40 mg.    Personal Goal Other Yes    Personal Goal Short term goal: R.D consult and  sugar control, Long term goal: Get in shape and walk further w/o SOB.    Intervention Will continue to monitor patient and progress worload without sign or symptom    Expected Outcomes Patient will acheive their goals.             Core Components/Risk Factors/Patient Goals Review:   Goals and Risk Factor Review     Row Name 08/07/21 0931 08/24/21 1226           Core Components/Risk Factors/Patient Goals Review   Personal Goals Review Weight Management/Obesity;Diabetes;Other;Hypertension;Lipids;Improve shortness of breath with ADL's Weight Management/Obesity;Diabetes;Other;Hypertension;Lipids;Improve shortness of breath with ADL's      Review RalphDejourcompleted 8 exercise sessions in Cardiac rehab.  Pt is off to a good start with good attendance. RalphTreyona .6kg weight loss since the beginning.  Vital signs have been stable with the exception of reported blood glucose readings from home which range in mid 200 range.  Despite nutritional counseling currently as well as pevious participations, RalphDocstruggled to manage his diabeties, latest A1C- 8.3.  RalphDavonnerts shortness of breath when walking at home particularily with incline. Pt denies any difference when he took Lasix for 3 days.  Pt denies COPD.  Shortness of breath could be the result of his body hiatius. Continue to work toward his personal goal for "sugar control" and getting in shape to be able to walk further with no shortness of breath. RalphSukhdeepcompleted15 exercise sessions. rdiac rehab. RalphLamond2.2 weight loss. Vital signs including O2 sat and CBG have been within normal limits Particularly with his CBG.  He has had 1 hypoglycemic incident as a result of over treating with insulin.  Despite feelings of shortness of breath - O2 sat remain within normal limits.  RalphLennartupcoming appt with new endocrinolgist and is hopeful that this wil also aid in diabetes management to achieve his personal goal for improve "sugar contol"  Ralphdemba nighggle with his shortness of breath continued efforts toward his personal goal to be able to walk further with no shortness of breath.      Expected Outcomes RalphHallis continue to engage in Cardiac Rehab for exercise, nutrition and adapatation of heart healthy lifestyle modifications. RalphTrayven continue to engage in Cardiac Rehab for exercise, nutrition and adapatation of heart healthy lifestyle modifications.               Core Components/Risk Factors/Patient Goals at Discharge (Final Review):   Goals and Risk Factor  Review - 08/24/21 1226       Core Components/Risk Factors/Patient Goals Review   Personal Goals Review Weight Management/Obesity;Diabetes;Other;Hypertension;Lipids;Improve shortness of breath with ADL's    Review [name] has completed15 exercise sessions. rdiac rehab. [name] has 2.2 weight loss. Vital signs including O2 sat and CBG have been within normal limits Particularly with his CBG.  He has had 1 hypoglycemic incident as a result of over treating with insulin.  Despite feelings of shortness of breath - O2 sat remain within normal limits.  [name] has upcoming appt with new endocrinolgist and is hopeful that this wil also aid in diabetes management to achieve his personal goal for improve "sugar contol" [name] struggle with his shortness of breath continued efforts toward his personal goal to be able to walk further with no shortness of breath.    Expected Outcomes [name] will continue to engage in Cardiac Rehab for exercise, nutrition and adapatation of heart healthy lifestyle modifications.             ITP Comments:  ITP Comments     Row Name 07/12/21 0819 08/07/21 0957 08/24/21 1224       ITP Comments Dr Fransico Him MD, Medical Director 30 day ITP Review.  Shaunak is off to a good start. 30 day ITP Review              Comments: Pt attendance is good and he is progressing well in the program.

## 2021-08-29 NOTE — Progress Notes (Signed)
[name] became suddenly short of breath during the warm-up phase of today's exercise session at Cardiac rehab. Had patient take a seat and instructed on pursed-lip breathing as his breathing was rapid and shallow and doing mouth breathing.  Oxygen saturation range was 94%-99% RA.  BP 140/70. He was on continuous telemetry monitor and noted with the onset of not being able to catch his breath and the labored breathing he briefly had a decrease in heart rate reading on the telemetry monitor in the  42-48 bpm. No other symptoms or complaints from patient, he remained warm-dry to the touch and no changes to his normal skin tone. After approximately 2 minutes his breathing pattern normalized.  Paged Dr. [name] and informed him of this incident which the shortness of breath occurring at warm-up has been frequent since patient began cardiac rehab. However, had not noted heart rate changes during the prior incidences with shortness of breath. Dr. [name] communicated that patient has had a history of heart rates in the 40s and approved to proceed with exercise today. Dr. [name] will review the ecg tracing and follow-up with patient in the office.  After discussing with Dr. [name] received the print-out tracing of the ecg tracing and upon further review noted a brief period where the heart rate decreased to 30 SB. [name] completed exercise and relaxation with no further breathing issues and heart rate response was within his usual normal limits.

## 2021-08-30 ENCOUNTER — Telehealth: Payer: Self-pay | Admitting: Cardiology

## 2021-08-30 DIAGNOSIS — R001 Bradycardia, unspecified: Secondary | ICD-10-CM

## 2021-08-30 DIAGNOSIS — I25118 Atherosclerotic heart disease of native coronary artery with other forms of angina pectoris: Secondary | ICD-10-CM

## 2021-08-30 NOTE — Telephone Encounter (Signed)
Chief Complaint  Patient presents with   Bradycardia   Fatigue          ICD-10-CM   1. Coronary artery disease of native artery of native heart with stable angina pectoris (HCC)  I25.118 PCV CARDIAC STRESS TEST    2. Sinus bradycardia  R00.1 PCV CARDIAC STRESS TEST     Orders Placed This Encounter  Procedures   PCV CARDIAC STRESS TEST    Standing Status:   Future    Standing Expiration Date:   10/30/2021

## 2021-08-31 ENCOUNTER — Encounter (HOSPITAL_COMMUNITY)
Admission: RE | Admit: 2021-08-31 | Discharge: 2021-08-31 | Disposition: A | Discharge status: Home / Self Care | Payer: Medicare Other | Source: Ambulatory Visit | Attending: Cardiology | Admitting: Cardiology

## 2021-08-31 ENCOUNTER — Ambulatory Visit (HOSPITAL_COMMUNITY): Payer: Medicare Other

## 2021-08-31 DIAGNOSIS — I2510 Atherosclerotic heart disease of native coronary artery without angina pectoris: Secondary | ICD-10-CM | POA: Diagnosis not present

## 2021-08-31 DIAGNOSIS — Z955 Presence of coronary angioplasty implant and graft: Secondary | ICD-10-CM

## 2021-09-03 ENCOUNTER — Encounter (HOSPITAL_COMMUNITY)
Admission: RE | Admit: 2021-09-03 | Discharge: 2021-09-03 | Disposition: A | Discharge status: Home / Self Care | Payer: Medicare Other | Source: Ambulatory Visit | Attending: Cardiology | Admitting: Cardiology

## 2021-09-03 DIAGNOSIS — Z955 Presence of coronary angioplasty implant and graft: Secondary | ICD-10-CM | POA: Insufficient documentation

## 2021-09-03 DIAGNOSIS — I10 Essential (primary) hypertension: Secondary | ICD-10-CM | POA: Diagnosis not present

## 2021-09-03 DIAGNOSIS — E8729 Other acidosis: Secondary | ICD-10-CM | POA: Diagnosis not present

## 2021-09-04 LAB — MAGNESIUM: Magnesium: 2.6 mg/dL — ABNORMAL HIGH (ref 1.6–2.3)

## 2021-09-05 ENCOUNTER — Encounter (HOSPITAL_COMMUNITY)
Admission: RE | Admit: 2021-09-05 | Discharge: 2021-09-05 | Disposition: A | Discharge status: Home / Self Care | Payer: Medicare Other | Source: Ambulatory Visit | Attending: Cardiology | Admitting: Cardiology

## 2021-09-05 DIAGNOSIS — Z955 Presence of coronary angioplasty implant and graft: Secondary | ICD-10-CM | POA: Diagnosis not present

## 2021-09-05 NOTE — Progress Notes (Signed)
[name] in today for cardiac rehab.  While on treadmill reported 1/10 chest discomfort that was similar in nature that previous cardiac event.  Discomfort resolved when he sat down.  No reoccurrence.  The following in basket message sent to Dr. [name].  "Good Morning Dr. [name],    Reaching out to you about the above patient.  [name] participated in Cardiac Rehab today and while on the treadmill at his normal speed and incline, he experienced chest discomfort middle of his chest.  He rated this as a "1" with the same nature as before.  BP 140/60.  Discomfort resolved when he sat down.  Able to finish very light exercise no hand weights with no further complaint of chest discomfort.  As you know he has had complaints of shortness of breath and fatigue pretty much daily here at rehab and at home. had a near syncopal episode last week when I was on vacation.     Wondering if his stress test scheduled for mid August be moved up or seen in the office in follow up?? I have worked with [name] before with prior cardiac events, something doesn't feel right with his ongoing and persistent symptoms.     Your thoughts   Pharmacist, hospital, BSN  Cardiac and Pulmonary Rehab Nurse Navigator"

## 2021-09-06 ENCOUNTER — Telehealth (HOSPITAL_COMMUNITY): Payer: Self-pay | Admitting: *Deleted

## 2021-09-06 NOTE — Telephone Encounter (Signed)
Called and left pt message that he will hold on returning to cardiac rehab until stress test completed on 7/7 at 10:30.  Results will dictate next steps and returning to cardiac rehab. Contact information provided. Alanson Aly, BSN Cardiac and Emergency planning/management officer

## 2021-09-06 NOTE — Telephone Encounter (Signed)
Good Morning Dr. [name] - I sent the following in basket yesterday concerning [name].  I also called the office yesterday.  In basket message: Chelsea Aus, RN  [name], MD Good Morning Dr. [name],    Reaching out to you about the above patient.  Tarrell participated in Cardiac Rehab today and while on the treadmill at his normal speed and incline, he experienced chest discomfort middle of his chest.  He rated this as a "1" with the same nature as before.  BP 140/60.  Discomfort resolved when he sat down.  Able to finish very light exercise no hand weights with no further complaint of chest discomfort.  As you know he has had complaints of shortness of breath and fatigue pretty much daily here at rehab and at home. had a near syncopal episode last week when I was on vacation.     Wondering if his stress test scheduled for mid August be moved up or seen in the office in follow up?? I have worked with [name] before with prior cardiac events, something doesn't feel right with his ongoing and persistent symptoms.     Your thoughts   Karlene Lineman RN, BSN  Cardiac and Pulmonary Rehab Nurse Navigator    Response from Dr. [name]: I will take care of it. Please schedule [facility] ASAP team

## 2021-09-07 ENCOUNTER — Ambulatory Visit: Payer: Medicare Other

## 2021-09-07 ENCOUNTER — Encounter (HOSPITAL_COMMUNITY): Payer: Medicare Other

## 2021-09-07 DIAGNOSIS — I25118 Atherosclerotic heart disease of native coronary artery with other forms of angina pectoris: Secondary | ICD-10-CM | POA: Diagnosis not present

## 2021-09-07 DIAGNOSIS — R001 Bradycardia, unspecified: Secondary | ICD-10-CM | POA: Diagnosis not present

## 2021-09-10 ENCOUNTER — Encounter (HOSPITAL_COMMUNITY)
Admission: RE | Admit: 2021-09-10 | Discharge: 2021-09-10 | Disposition: A | Discharge status: Home / Self Care | Payer: Medicare Other | Source: Ambulatory Visit | Attending: Cardiology | Admitting: Cardiology

## 2021-09-10 DIAGNOSIS — Z955 Presence of coronary angioplasty implant and graft: Secondary | ICD-10-CM

## 2021-09-11 ENCOUNTER — Telehealth: Payer: Self-pay

## 2021-09-11 NOTE — Progress Notes (Signed)
D/W patient. Cannot explain marked fatigue and dyspnea at the beginning of the exercise, hence it may be a good option for you to have pulmonary evaluation especially as he had been complaining of difficulty with dyspnea and also you had COVID recently as well.  Although your blood pressure was elevated at peak exercise, your symptoms do not correlate with that.  Your blood pressure has been fairly well controlled and normal in cardiac rehab.  Your stress test also reveals good heart rate response and achieved 86% of MPHR.  Hence I do not see any contraindication for cardiac rehab from my side.

## 2021-09-11 NOTE — Progress Notes (Signed)
From cardiac standpoint I do not see any contraindication for him to resume cardiac rehab.  Heart rate response is appropriate and achieved target heart rate of 86% for his age without ST-T wave changes of ischemia.  He is seeing his PCP tomorrow to get evaluation for dyspnea.

## 2021-09-11 NOTE — Telephone Encounter (Signed)
DWP. Patient scheduled.

## 2021-09-11 NOTE — Telephone Encounter (Signed)
Patient last seen in May and was referred to neurologist.   He is now requesting referral to pulmonologist. He doesn't know of one and having trouble breathing. Currently in rehab for heart and had to quit several times due to difficulty breathing. He starts gasping for air during rehab.   Patient is aware he may need an apnt for this request.

## 2021-09-12 ENCOUNTER — Telehealth (HOSPITAL_COMMUNITY): Payer: Self-pay | Admitting: *Deleted

## 2021-09-12 ENCOUNTER — Encounter: Payer: Medicare Other | Admitting: Family

## 2021-09-12 ENCOUNTER — Encounter (HOSPITAL_COMMUNITY): Payer: Medicare Other

## 2021-09-12 NOTE — Telephone Encounter (Signed)
Patient left message on department voicemail this morning. He had to take a trip out of town yesterday and will be absent from cardiac rehab today.

## 2021-09-14 ENCOUNTER — Encounter (HOSPITAL_COMMUNITY)
Admission: RE | Admit: 2021-09-14 | Discharge: 2021-09-14 | Disposition: A | Discharge status: Home / Self Care | Payer: Medicare Other | Source: Ambulatory Visit | Attending: Cardiology | Admitting: Cardiology

## 2021-09-14 DIAGNOSIS — Z955 Presence of coronary angioplasty implant and graft: Secondary | ICD-10-CM | POA: Diagnosis not present

## 2021-09-17 ENCOUNTER — Encounter (HOSPITAL_COMMUNITY)
Admission: RE | Admit: 2021-09-17 | Discharge: 2021-09-17 | Disposition: A | Discharge status: Home / Self Care | Payer: Medicare Other | Source: Ambulatory Visit | Attending: Cardiology | Admitting: Cardiology

## 2021-09-17 DIAGNOSIS — Z955 Presence of coronary angioplasty implant and graft: Secondary | ICD-10-CM

## 2021-09-19 ENCOUNTER — Encounter (HOSPITAL_COMMUNITY)
Admission: RE | Admit: 2021-09-19 | Discharge: 2021-09-19 | Disposition: A | Discharge status: Home / Self Care | Payer: Medicare Other | Source: Ambulatory Visit | Attending: Cardiology | Admitting: Cardiology

## 2021-09-19 DIAGNOSIS — Z955 Presence of coronary angioplasty implant and graft: Secondary | ICD-10-CM | POA: Diagnosis not present

## 2021-09-19 NOTE — Progress Notes (Signed)
Pt returned to cardiac rehab.  Pt noted that his breathing was difficult walking in.  Attributed to the poor air quality and high humidity.  Aric expressed feeling tired as he was caring for two young grandkids on yesterday.  Pulse oximetry worn during exercise.  Pt on the treadmill about 10 minutes in began having 5/10 chest heaviness with no sharpness like his prior "cardiac" pain.  Exercise stopped bp 142/70, o2 sat 95.  Discomfort resolved immediately upon stopping.  Able to ambulate around the track for the remaining time for this station as well as 2nd station and cool down with no other complaints. Alanson Aly, BSN Cardiac and Emergency planning/management officer

## 2021-09-20 ENCOUNTER — Telehealth: Payer: Self-pay

## 2021-09-20 NOTE — Telephone Encounter (Signed)
I called patient and provided him with the contact information for [facility].

## 2021-09-20 NOTE — Telephone Encounter (Signed)
-----   Message from [name], NP sent at 09/20/2021  9:30 AM EDT ----- Referral was sent back in May, please refer to referral tab.   Referral sent to [facility] they will contact patient.  Algis Greenhouse, MD  Address: [address], [place], [place] [address]   ----- Message ----- From: Darron Doom Sent: 09/11/2021   1:06 PM EDT To: [name], NP  Patient called in asking if Shanda Bumps can please refer him to a Neurologist say that he saw one years ago but could not remember the name. Please advise

## 2021-09-21 ENCOUNTER — Encounter (HOSPITAL_COMMUNITY)
Admission: RE | Admit: 2021-09-21 | Discharge: 2021-09-21 | Disposition: A | Discharge status: Home / Self Care | Payer: Medicare Other | Source: Ambulatory Visit | Attending: Cardiology | Admitting: Cardiology

## 2021-09-21 DIAGNOSIS — Z955 Presence of coronary angioplasty implant and graft: Secondary | ICD-10-CM | POA: Diagnosis not present

## 2021-09-23 ENCOUNTER — Other Ambulatory Visit: Payer: Self-pay | Admitting: Nurse Practitioner

## 2021-09-23 NOTE — Progress Notes (Signed)
  This encounter was created in error - please disregard. No show 

## 2021-09-24 ENCOUNTER — Encounter (HOSPITAL_COMMUNITY)
Admission: RE | Admit: 2021-09-24 | Discharge: 2021-09-24 | Disposition: A | Discharge status: Home / Self Care | Payer: Medicare Other | Source: Ambulatory Visit | Attending: Cardiology | Admitting: Cardiology

## 2021-09-24 VITALS — Ht 68.25 in | Wt 207.0 lb

## 2021-09-24 DIAGNOSIS — Z955 Presence of coronary angioplasty implant and graft: Secondary | ICD-10-CM

## 2021-09-26 ENCOUNTER — Encounter (HOSPITAL_COMMUNITY)
Admission: RE | Admit: 2021-09-26 | Discharge: 2021-09-26 | Disposition: A | Discharge status: Home / Self Care | Payer: Medicare Other | Source: Ambulatory Visit | Attending: Cardiology | Admitting: Cardiology

## 2021-09-26 DIAGNOSIS — Z955 Presence of coronary angioplasty implant and graft: Secondary | ICD-10-CM | POA: Diagnosis not present

## 2021-09-26 NOTE — Progress Notes (Signed)
Cardiac Individual Treatment Plan  Patient Details  Name: Kenneth Brumbaugh Sr. MRN: [id number] Date of Birth: 05/16/1950 Referring Provider:   Flowsheet Row CARDIAC REHAB PHASE II ORIENTATION from 07/12/2021 in [facility]  Referring Provider Dr. Adrian Prows MD       Initial Encounter Date:  [facility] from 07/12/2021 in [facility]  Date 07/12/21       Visit Diagnosis: 05/01/21 S/ P DES RCA  Patient's Home Medications on Admission:  Current Outpatient Medications:    acetaminophen (TYLENOL) 500 MG tablet, Take 500 mg by mouth every 6 (six) hours as needed for mild pain, moderate pain, headache or fever., Disp: , Rfl:    albuterol (VENTOLIN HFA) 108 (90 Base) MCG/ACT inhaler, TAKE 2 PUFFS BY MOUTH EVERY 6 HOURS AS NEEDED FOR WHEEZE OR SHORTNESS OF BREATH, Disp: 18 each, Rfl: 1   amoxicillin (AMOXIL) 500 MG capsule, Take 500 mg by mouth 3 (three) times daily., Disp: , Rfl:    aspirin EC 81 MG tablet, Take 1 tablet (81 mg total) by mouth daily. Swallow whole. (Patient taking differently: Take 81 mg by mouth every evening. Swallow whole.), Disp: 90 tablet, Rfl: 3   BD PEN NEEDLE NANO 2ND GEN 32G X 4 MM MISC, USE AS DIRECTED, Disp: 100 each, Rfl: 3   benzonatate (TESSALON) 100 MG capsule, Take 1 capsule (100 mg total) by mouth 2 (two) times daily as needed for cough., Disp: 20 capsule, Rfl: 0   clopidogrel (PLAVIX) 75 MG tablet, Take 1 tablet (75 mg total) by mouth daily. (Patient taking differently: Take 75 mg by mouth every evening.), Disp: 90 tablet, Rfl: 3   Continuous Blood Gluc Receiver (FREESTYLE LIBRE 14 DAY READER) DEVI, Inject 1 Device as directed daily as needed. Use once daily as direct to check blood sugar E11.51, Disp: 1 each, Rfl: 0   Continuous Blood Gluc Sensor (FREESTYLE LIBRE 14 DAY SENSOR) MISC, USE TO TEST BLOOD SUGAR THREE TIMES DAILY. E11.51. E11.59, Disp: 1 each, Rfl: 12    fluticasone (FLONASE) 50 MCG/ACT nasal spray, Place 1 spray into both nostrils daily as needed for allergies or rhinitis., Disp: , Rfl:    furosemide (LASIX) 40 MG tablet, Take 1 tablet (40 mg total) by mouth daily as needed for fluid or edema., Disp: 90 tablet, Rfl: 3   gabapentin (NEURONTIN) 100 MG capsule, TAKE 2 CAPSULES BY MOUTH AT BEDTIME., Disp: 180 capsule, Rfl: 1   glucose 4 GM chewable tablet, Chew 1 tablet (4 g total) by mouth as needed for low blood sugar., Disp: 50 tablet, Rfl: 12   guaiFENesin (MUCINEX) 600 MG 12 hr tablet, Take 600 mg by mouth 2 (two) times daily as needed (congestion/bronchial issues.)., Disp: , Rfl:    insulin aspart (NOVOLOG FLEXPEN) 100 UNIT/ML FlexPen, Inject 25-30 Units into the skin See admin instructions. Inject 30 units subcutaneously in the morning, inject 25 units subcutaneously with lunch & inject 25 units subcutaneously with dinner., Disp: , Rfl:    Insulin Glargine (BASAGLAR KWIKPEN) 100 UNIT/ML, Inject 30 Units into the skin at bedtime., Disp: 30 mL, Rfl: 3   insulin lispro (HUMALOG KWIKPEN) 100 UNIT/ML KwikPen, 3 times a day (just before each meal) 30-25-25 units, and pen needles 4/day, Disp: 90 mL, Rfl: 3   isosorbide mononitrate (IMDUR) 60 MG 24 hr tablet, TAKE 1 TABLET BY MOUTH EVERY DAY, Disp: 90 tablet, Rfl: 1   LIFESCAN FINEPOINT LANCETS MISC, Use  to test for blood sugar three times daily dx: E11.51, Disp: 300 each, Rfl: 1   metoprolol succinate (TOPROL-XL) 25 MG 24 hr tablet, Take 1 tablet (25 mg total) by mouth daily. TAKE WITH OR IMMEDIATELY FOLLOWING A MEAL. (Patient taking differently: Take 25 mg by mouth every evening. TAKE 2 WITH OR IMMEDIATELY FOLLOWING A MEAL.), Disp: 90 tablet, Rfl: 1   nitroGLYCERIN (NITROSTAT) 0.4 MG SL tablet, PLACE 1 TABLET UNDER THE TONGUE EVERY 5 MINUTES AS NEEDED FOR CHEST PAIN., Disp: 50 tablet, Rfl: 0   ONETOUCH ULTRA test strip, USE TO TEST FOR BLOOD SUGAR THREE TIMES DAILY DX: E11.51, Disp: 300 strip, Rfl: 11    pantoprazole (PROTONIX) 40 MG tablet, TAKE ONE TABLET BY MOUTH ONCE DAILY FOR STOMACH, Disp: 90 tablet, Rfl: 3   PRALUENT 150 MG/ML SOAJ, INJECT 1 PEN INTO THE SKIN EVERY 14 (FOURTEEN) DAYS., Disp: 6 mL, Rfl: 1   pramipexole (MIRAPEX) 1 MG tablet, TAKE 1 TABLET BY MOUTH AT 5PM, 1 TABLET BY MOUTH AT 7PM, AND TAKE 2 TABLETS BY MOUTH AT BEDTIME., Disp: 360 tablet, Rfl: 1   valsartan (DIOVAN) 80 MG tablet, Take 80 mg by mouth every evening., Disp: , Rfl:  No current facility-administered medications for this encounter.  Facility-Administered Medications Ordered in Other Encounters:    [COMPLETED] hydrALAZINE (APRESOLINE) injection 10 mg, 10 mg, Intravenous, Once, Adrian Prows, MD, 10 mg at 05/01/21 1054  Past Medical History: Past Medical History:  Diagnosis Date   Arthritis    "left thumb" (05/23/2017) right shoulder   Barrett's esophagus    "we've been told that it's all gone; still take RX for GERD" (05/23/2017)   Bilateral swelling of feet and ankles x 3 weeks as of 12-01-2019   CAD (coronary artery disease), native coronary artery    Coronary angiogram 05/03/2014:  Proximal LAD 3.0x12 mm Promus premier stent.  04/08/2014: Mid Cx 3.5 x 16 mm Promus DES.  03/29/2013: Mid LAD 2.75 x 38 mm Promus Premier drug-eluting stent, balloon angioplasty of D1 and distal LAD and stenting of distal RCA with 2.75 x 24 mm promos Premier drug-eluting stent 12/15/2012 widely patent.   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  04/08/2014: Mid  Cx 3.5 x 16 mm Promus DES.  03/29/2013: Mid LAD 2.75 x 38 mm Promus Premier drug-eluting stent, balloon angioplasty of D1 and distal LAD and stenting of distal RCA with 2.75 x 24 mm promos Premier drug-eluting stent 12/15/2012 widely patent.    Tobacco Use: Social History   Tobacco Use  Smoking Status Never  Smokeless Tobacco Never    Labs: Review Flowsheet  More data exists      Latest Ref Rng & Units 09/14/2020 12/25/2020 01/14/2021 01/15/2021 05/07/2021  Labs for ITP Cardiac and Pulmonary Rehab  Cholestrol 0 - 200 mg/dL - - - 81  -  LDL (calc) 0 - 99 mg/dL - - - 15  -  HDL-C >40 mg/dL - - - 31  -  Trlycerides <150 mg/dL - - - 174  -  Hemoglobin A1c 4.0 - 5.6 % 8.6  8.1  - 7.9  8.3   TCO2 22 - 32 mmol/L - - 24  - -    Capillary Blood Glucose: Lab Results  Component Value Date   GLUCAP 112 (H) 08/17/2021   GLUCAP 75 08/17/2021   GLUCAP 256 (H) 08/15/2021   GLUCAP 155 (H) 07/18/2021   GLUCAP 273 (H) 07/18/2021     Exercise Target Goals: Exercise Program Goal: Individual exercise prescription set using results from initial 6 min walk test and THRR while considering  patient's activity barriers and safety.   Exercise Prescription Goal: Initial exercise prescription builds to 30-45 minutes a day of aerobic activity, 2-3 days per week.  Home exercise guidelines will be given to patient during program as part of exercise  prescription that the participant will acknowledge.  Activity Barriers & Risk Stratification:  Activity Barriers & Cardiac Risk Stratification - 07/12/21 0939       Activity Barriers & Cardiac Risk Stratification   Activity Barriers Arthritis;Deconditioning;Shortness of Breath    Cardiac Risk Stratification High             6 Minute Walk:  6 Minute Walk     Row Name 07/12/21 0936 09/24/21 0839       6 Minute Walk   Phase Initial Discharge    Distance 1430 feet 2333 feet    Distance % Change -- 65.15 %    Distance Feet Change -- 903 ft    Walk Time 6 minutes 6 minutes    # of Rest Breaks 0 0    MPH 2.71 4.4    METS 2.91 4.88    RPE 11 13    Perceived Dyspnea  0 0    VO2 Peak 10.19 16.97    Symptoms Yes (comment) Yes (comment)    Comments Bliateral hip pain 1/10, Relieved by rest bilateral calf muscle pain, 5/10. Resolved with rest.    Resting HR 74 bpm 87 bpm    Resting BP 124/70 148/80    Resting Oxygen Saturation  97 % 96 %    Exercise Oxygen Saturation  during 6 min walk 97 % 97 %    Max Ex. HR 93 bpm 111 bpm    Max Ex. BP 150/64 174/60    2 Minute Post BP 134/68 128/60             Oxygen Initial Assessment:   Oxygen Re-Evaluation:   Oxygen Discharge (Final Oxygen Re-Evaluation):   Initial Exercise Prescription:  Initial Exercise Prescription - 07/12/21 0900       Date of Initial Exercise RX and Referring Provider   Date 07/12/21  Referring Provider Dr. Adrian Prows MD    Expected Discharge Date 09/07/21      Treadmill   MPH 2.5    Grade 0    Minutes 15    METs 2.91      NuStep   Level 2    SPM 80    Minutes 15    METs 2.3      Prescription Details   Frequency (times per week) 3    Duration Progress to 30 minutes of continuous aerobic without signs/symptoms of physical distress      Intensity   THRR 40-80% of Max Heartrate 60-119    Ratings of Perceived Exertion 11-13    Perceived Dyspnea 0-4      Progression   Progression  Continue progressive overload as per policy without signs/symptoms or physical distress.      Resistance Training   Training Prescription Yes    Weight 3    Reps 10-15             Perform Capillary Blood Glucose checks as needed.  Exercise Prescription Changes:   Exercise Prescription Changes     Row Name 07/16/21 0830 08/06/21 0830 08/17/21 0820 08/24/21 0839 09/10/21 0833     Response to Exercise   Blood Pressure (Admit) 134/70 122/80 128/62 120/56 110/64   Blood Pressure (Exercise) 160/78 152/82 160/80 142/68 160/80   Blood Pressure (Exit) 126/70 124/66 110/70 112/62 110/54   Heart Rate (Admit) 66 bpm 60 bpm 61 bpm 62 bpm 72 bpm   Heart Rate (Exercise) 102 bpm 104 bpm 94 bpm 106 bpm 113 bpm   Heart Rate (Exit) 72 bpm 64 bpm 79 bpm 72 bpm 71 bpm   Rating of Perceived Exertion (Exercise) 9 12 12 11 13    Perceived Dyspnea (Exercise) 0 0 0 0 3   Symptoms 0 0 Low CBG post exercise, recovered with ginger ale 0 SOB during warm up   Comments Pt first day in the [facility] program Reviewed MET's, goals and Home ExRx Reviewed MET's Reviewed MET's and goals Reviewed MET's   Duration Progress to 30 minutes of  aerobic without signs/symptoms of physical distress Progress to 30 minutes of  aerobic without signs/symptoms of physical distress Progress to 30 minutes of  aerobic without signs/symptoms of physical distress Progress to 30 minutes of  aerobic without signs/symptoms of physical distress Progress to 30 minutes of  aerobic without signs/symptoms of physical distress   Intensity THRR unchanged THRR unchanged THRR unchanged THRR unchanged THRR unchanged     Progression   Progression Continue to progress workloads to maintain intensity without signs/symptoms of physical distress. Continue to progress workloads to maintain intensity without signs/symptoms of physical distress. Continue to progress workloads to maintain intensity without signs/symptoms of physical distress. Continue to progress  workloads to maintain intensity without signs/symptoms of physical distress. Continue to progress workloads to maintain intensity without signs/symptoms of physical distress.   Average METs 2.41 2.77 3.07 3.02 3.5     Resistance Training   Training Prescription Yes Yes Yes Yes Yes   Weight 3 5 4 4 4    Reps 10-15 10-15 10-15 10-15 10-15   Time 10 Minutes 10 Minutes 10 Minutes 10 Minutes 10 Minutes     Treadmill   MPH 2.5 2.7 2.7 2.7 3   Grade 0 1 1 1 1    Minutes 15 15 15 15 15    METs 2.91 3.44 3.44 3.44 3.5     NuStep   Level 2 2 2  2 2   SPM 80 100 100 100 100   Minutes 15 15 15 15 15    METs 1.9 2.1 2.7 2.6 3.5     Home Exercise Plan   Plans to continue exercise at -- Home (comment) Home (comment) Home (comment) Home (comment)   Frequency -- Add 2 additional days to program exercise sessions. Add 2 additional days to program exercise sessions. Add 2 additional days to program exercise sessions. Add 2 additional days to program exercise sessions.   Initial Home Exercises Provided -- 08/06/21 08/06/21 08/06/21 08/06/21            Exercise Comments:   Exercise Comments     Row Name 07/16/21 0926 08/06/21 0830 08/17/21 0823 08/24/21 0848 09/10/21 0836   Exercise Comments Pt first day in the [facility] program. Pt is tolerating exercise well with an average MET level of 2.41. Pt is learning his THRR, RPE and ExRx. Pt feels good about his exercise program. Reviewed MET's, goals and home ExRx. Pt is tolerating exercise well with an average MET level of 2.77. Pt will continue to exercise on his own by walking and golf 2 days a week. Pt plans to ask his [employer] about membership this week and will let us know if he choosees to add that in for exercise. Pt feels great about his goals so far, he has met with the RD, feels in better control of blood sugars, feels like he is getting in to shape slowy and is working on wt loss. Pt states SOB is doing really well and he feels in much better control.  Reviewed MET's. Pt is tolerating exercise well with an average MET level of 3.07. Pt is doing well and increasing MET's, pt feels good about his progress so far. He is still working on controlling his CBG, but is making improvments. Blood sugar dropped to 75 post exercise, recovered with ginger ale and education from [facility], and goals. Pt is tolerating exercise well with an average MET level of 3.02. Pt feels much better about controling his blood glucose, and is in better control of his SOB. Pt has lost about 6 lbs since starting at [facility] and feels like he is in beter shape. Overall pt is doing well Reviewed MET's. Pt is tolerating exercise well with an average MET level of 3.5. Pt has continuing SOB during warm up. But overall is progressing well.            Exercise Goals and Review:   Exercise Goals     Row Name 07/12/21 1104             Exercise Goals   Increase Physical Activity Yes       Intervention Provide advice, education, support and counseling about physical activity/exercise needs.;Develop an individualized exercise prescription for aerobic and resistive training based on initial evaluation findings, risk stratification, comorbidities and participant's personal goals.       Expected Outcomes Short Term: Attend rehab on a regular basis to increase amount of physical activity.;Long Term: Add in home exercise to make exercise part of routine and to increase amount of physical activity.;Long Term: Exercising regularly at least 3-5 days a week.       Increase Strength and Stamina Yes       Intervention Provide advice, education, support and counseling about physical activity/exercise needs.;Develop an individualized exercise prescription for aerobic and resistive training based on initial evaluation findings, risk stratification, comorbidities and participant's personal goals.  Expected Outcomes Short Term: Increase workloads from initial exercise prescription for  resistance, speed, and METs.;Short Term: Perform resistance training exercises routinely during rehab and add in resistance training at home;Long Term: Improve cardiorespiratory fitness, muscular endurance and strength as measured by increased METs and functional capacity (6MWT)       Able to understand and use rate of perceived exertion (RPE) scale Yes       Intervention Provide education and explanation on how to use RPE scale       Expected Outcomes Short Term: Able to use RPE daily in rehab to express subjective intensity level;Long Term:  Able to use RPE to guide intensity level when exercising independently       Knowledge and understanding of Target Heart Rate Range (THRR) Yes       Intervention Provide education and explanation of THRR including how the numbers were predicted and where they are located for reference       Expected Outcomes Short Term: Able to state/look up THRR;Long Term: Able to use THRR to govern intensity when exercising independently;Short Term: Able to use daily as guideline for intensity in rehab       Understanding of Exercise Prescription Yes       Intervention Provide education, explanation, and written materials on patient's individual exercise prescription       Expected Outcomes Short Term: Able to explain program exercise prescription;Long Term: Able to explain home exercise prescription to exercise independently                Exercise Goals Re-Evaluation :  Exercise Goals Re-Evaluation     Row Name 07/16/21 0924 08/06/21 0830 08/24/21 0841         Exercise Goal Re-Evaluation   Exercise Goals Review Increase Physical Activity;Increase Strength and Stamina;Able to understand and use rate of perceived exertion (RPE) scale;Knowledge and understanding of Target Heart Rate Range (THRR);Understanding of Exercise Prescription Increase Physical Activity;Increase Strength and Stamina;Able to understand and use rate of perceived exertion (RPE) scale;Knowledge and  understanding of Target Heart Rate Range (THRR);Understanding of Exercise Prescription Increase Physical Activity;Increase Strength and Stamina;Able to understand and use rate of perceived exertion (RPE) scale;Knowledge and understanding of Target Heart Rate Range (THRR);Understanding of Exercise Prescription     Comments Pt first day in the CRP2 program. Pt is tolerating exercise well with an average MET level of 2.41. Pt is learning his THRR, RPE and ExRx. Pt feels good about his exercise program. Reviewed MET's, goals and home ExRx. Pt is tolerating exercise well with an average MET level of 2.77. Pt will continue to exercise on his own by walking and golf 2 days a week. Pt plans to ask his [employer] about membership this week and will let us know if he choosees to add that in for exercise. Pt feels great about his goals so far, he has met with the RD, feels in better control of blood sugars, feels like he is getting in to shape slowy and is working on  wt loss. Pt states SOB is doing really well and he feels in much better control. Reviewed MET's, and goals. Pt is tolerating exercise well with an average MET level of 3.02. Pt feels much better about controling his blood glucose, and is in better control of his SOB. Pt has lost about 6 lbs since starting at [facility] and feels like he is in beter shape. Overall pt is doing well     Expected Outcomes will continue to monitor pt  and progress workloads as tolerated without sign or symptom. Pt will continue to exercise on his own 2 days a week. Will continue to monitor pt and progress workloads as tolerated without sign or symptom. Will continue to monitor pt and progress workloads as tolerated without sign or symptom.              Discharge Exercise Prescription (Final Exercise Prescription Changes):  Exercise Prescription Changes - 09/10/21 0833       Response to Exercise   Blood Pressure (Admit) 110/64    Blood Pressure (Exercise) 160/80    Blood Pressure  (Exit) 110/54    Heart Rate (Admit) 72 bpm    Heart Rate (Exercise) 113 bpm    Heart Rate (Exit) 71 bpm    Rating of Perceived Exertion (Exercise) 13    Perceived Dyspnea (Exercise) 3    Symptoms SOB during warm up    Comments Reviewed MET's    Duration Progress to 30 minutes of  aerobic without signs/symptoms of physical distress    Intensity THRR unchanged      Progression   Progression Continue to progress workloads to maintain intensity without signs/symptoms of physical distress.    Average METs 3.5      Resistance Training   Training Prescription Yes    Weight 4    Reps 10-15    Time 10 Minutes      Treadmill   MPH 3    Grade 1    Minutes 15    METs 3.5      NuStep   Level 2    SPM 100    Minutes 15    METs 3.5      Home Exercise Plan   Plans to continue exercise at Home (comment)    Frequency Add 2 additional days to program exercise sessions.    Initial Home Exercises Provided 08/06/21             Nutrition:  Target Goals: Understanding of nutrition guidelines, daily intake of sodium <1564m, cholesterol <2091m calories 30% from fat and 7% or less from saturated fats, daily to have 5 or more servings of fruits and vegetables.  Biometrics:  Pre Biometrics - 07/12/21 1105       Pre Biometrics   Height 5' 8.25" (1.734 m)    Weight 95 kg    Waist Circumference 44.5 inches    Hip Circumference 43.5 inches    Waist to Hip Ratio 1.02 %    BMI (Calculated) 31.6    Triceps Skinfold 17 mm    % Body Fat 31.7 %    Grip Strength 31 kg    Flexibility 10.5 in    Single Leg Stand 26.8 seconds             Post Biometrics - 09/24/21 0850        Post  Biometrics   Height 5' 8.25" (1.734 m)    Weight 93.9 kg    Waist Circumference 43.5 inches    Hip Circumference 42.5 inches    Waist to Hip Ratio 1.02 %    BMI (Calculated) 31.23    Triceps Skinfold 16 mm    % Body Fat 30.9 %    Grip Strength 32 kg    Flexibility 11.5 in    Single Leg Stand 30  seconds             Nutrition Therapy Plan and Nutrition Goals:  Nutrition Therapy & Goals - 09/14/21 [id number]  Nutrition Therapy   Diet Heart Healthy/ Carbohydrate Consistent      Personal Nutrition Goals   Nutrition Goal Patient to implement a heart healthy meal plan to include fruit, vegetables, whole grains, lean protein/plant protein, and nonfat dairy   in progress   Personal Goal #2 Patient to identify strategies for blood sugar control   The plate method, high fiber/high protein snacks, carbohydrate consistency, etc.   Personal Goal #3 Patient to limit food sources of trans fats, saturated fats, refined carbohydrates and sodium   in progress   Comments Goals in progress. Patient has reduced meal time insulin (by approximately half); he reports decreased blood sugar lows. He continues to work on eating on a consistent schedule and choosing high fiber foods.      Intervention Plan   Intervention Prescribe, educate and counsel regarding individualized specific dietary modifications aiming towards targeted core components such as weight, hypertension, lipid management, diabetes, heart failure and other comorbidities.    Expected Outcomes Short Term Goal: Understand basic principles of dietary content, such as calories, fat, sodium, cholesterol and nutrients.;Long Term Goal: Adherence to prescribed nutrition plan.             Nutrition Assessments:  Nutrition Assessments - 07/16/21 0803       Rate Your Plate Scores   Pre Score 46            MEDIFICTS Score Key: ?70 Need to make dietary changes  40-70 Heart Healthy Diet ? 40 Therapeutic Level Cholesterol Diet   Flowsheet Row CARDIAC REHAB PHASE II EXERCISE from 07/16/2021 in [facility]  Picture Your Plate Total Score on Admission 46      Picture Your Plate Scores: <79 Unhealthy dietary pattern with much room for improvement. 41-50 Dietary pattern unlikely to meet recommendations  for good health and room for improvement. 51-60 More healthful dietary pattern, with some room for improvement.  >60 Healthy dietary pattern, although there may be some specific behaviors that could be improved.    Nutrition Goals Re-Evaluation:  Nutrition Goals Re-Evaluation     [place] Name 08/24/21 0944 09/14/21 1505           Goals   Current Weight 205 lb 0.4 oz (93 kg) 204 lb 5.9 oz (92.7 kg)      Comment Patient is down 4.4# since starting with our program. He has been able to reduce meal time insulin since making many dietary changes; this has resulted in fewer lows/highs. no new labs at this time.      Expected Outcome Goals in progress. Patient continues to monitor blood sugar. Encouraged carbohydrate consistency and high quality/high fiber carbohydrate sources. Goals in progress. Patient has reduced meal time insulin (by approximately half); he reports decreased blood sugar lows since making this change. He continues to work on eating on a consistent schedule and choosing high fiber foods. Patient is down ~5# since starting with our program.               Nutrition Goals Re-Evaluation:  Nutrition Goals Re-Evaluation     [place] Name 08/24/21 0944 09/14/21 6979           Goals   Current Weight 205 lb 0.4 oz (93 kg) 204 lb 5.9 oz (92.7 kg)      Comment Patient is down 4.4# since starting with our program. He has been able to reduce meal time insulin since making many dietary changes; this has resulted in fewer lows/highs. no new  labs at this time.      Expected Outcome Goals in progress. Patient continues to monitor blood sugar. Encouraged carbohydrate consistency and high quality/high fiber carbohydrate sources. Goals in progress. Patient has reduced meal time insulin (by approximately half); he reports decreased blood sugar lows since making this change. He continues to work on eating on a consistent schedule and choosing high fiber foods. Patient is down ~5# since starting with  our program.               Nutrition Goals Discharge (Final Nutrition Goals Re-Evaluation):  Nutrition Goals Re-Evaluation - 09/14/21 5597       Goals   Current Weight 204 lb 5.9 oz (92.7 kg)    Comment no new labs at this time.    Expected Outcome Goals in progress. Patient has reduced meal time insulin (by approximately half); he reports decreased blood sugar lows since making this change. He continues to work on eating on a consistent schedule and choosing high fiber foods. Patient is down ~5# since starting with our program.             Psychosocial: Target Goals: Acknowledge presence or absence of significant depression and/or stress, maximize coping skills, provide positive support system. Participant is able to verbalize types and ability to use techniques and skills needed for reducing stress and depression.  Initial Review & Psychosocial Screening:  Initial Psych Review & Screening - 07/12/21 0831       Initial Review   Current issues with None Identified      Family Dynamics   Good Support System? Yes   Ashton has his wife for support     Barriers   Psychosocial barriers to participate in program There are no identifiable barriers or psychosocial needs.      Screening Interventions   Interventions Encouraged to exercise             Quality of Life Scores:  Quality of Life - 07/12/21 1107       Quality of Life   Select Quality of Life      Quality of Life Scores   Health/Function Pre 21.46 %    Socioeconomic Pre 25.31 %    Psych/Spiritual Pre 25.5 %    Family Pre 27 %    GLOBAL Pre 23.92 %            Scores of 19 and below usually indicate a poorer quality of life in these areas.  A difference of  2-3 points is a clinically meaningful difference.  A difference of 2-3 points in the total score of the Quality of Life Index has been associated with significant improvement in overall quality of life, self-image, physical symptoms, and general  health in studies assessing change in quality of life.  PHQ-9: Review Flowsheet  More data exists      07/12/2021 01/18/2021 03/06/2020 03/02/2020 10/28/2019  Depression screen PHQ 2/9  Decreased Interest 0 0 0 0 0  Down, Depressed, Hopeless 0 0 0 0 0  PHQ - 2 Score 0 0 0 0 0   Interpretation of Total Score  Total Score Depression Severity:  1-4 = Minimal depression, 5-9 = Mild depression, 10-14 = Moderate depression, 15-19 = Moderately severe depression, 20-27 = Severe depression   Psychosocial Evaluation and Intervention:  Psychosocial Evaluation - 07/16/21 0755       Psychosocial Evaluation & Interventions   Interventions Encouraged to exercise with the program and follow exercise prescription    Comments  Johnthan who is previous participant in cardiac rehab denies any psychosocial barriers. Koree feels supported  by his spouse in his efforts to adopt heart healthy lifestlye.    Expected Outcomes Keaten will remain free of any psychosocial barriers and continue positivwe and healthy outlook on life    Continue Psychosocial Services  No Follow up required             Psychosocial Re-Evaluation:  Psychosocial Re-Evaluation     [address] Name 08/07/21 3845 08/24/21 1224 09/25/21 1459         Psychosocial Re-Evaluation   Current issues with None Identified None Identified None Identified     Comments Valentine who is a previous participant in cardiac rehab denies any psychosocial barriers.  Davidlee has a supportive wife and family. Kinnie who is a previous participant in cardiac rehab denies any psychosocial barriers.  Malachy has a supportive wife and family. He remains concerned about his diabetes management and is hopeful that the new endocrinologist and the application of what he has learned from the RD here in cardiac rehab are steps in the right direction Nealy has a supportive wife and family. He remains concerned about his continued struggles with his breathing particular on the treadmill.   Vipul had a negative cardiac work up - low risk GXT.  Has upcoming appt with his PCP and will ask for pulmonary consult.  Ryota did have Covid in the past and wonders could this be the issue.     Expected Outcomes Tarris will remain free of any psychosocial barriers and continue to exhibit a positive and healthy lifestyle. Navy will remain free of any psychosocial barriers and continue to exhibit a positive and healthy lifestyle. Javonn will remain free of any psychosocial barriers and continue to exhibit a positive and healthy lifestyle.     Interventions Encouraged to attend Cardiac Rehabilitation for the exercise Encouraged to attend Cardiac Rehabilitation for the exercise Encouraged to attend Cardiac Rehabilitation for the exercise     Continue Psychosocial Services  Follow up required by staff No Follow up required No Follow up required              Psychosocial Discharge (Final Psychosocial Re-Evaluation):  Psychosocial Re-Evaluation - 09/25/21 1459       Psychosocial Re-Evaluation   Current issues with None Identified    Comments Tryton has a supportive wife and family. He remains concerned about his continued struggles with his breathing particular on the treadmill.  Wash had a negative cardiac work up - low risk GXT.  Has upcoming appt with his PCP and will ask for pulmonary consult.  Alezander did have Covid in the past and wonders could this be the issue.    Expected Outcomes Leovanni will remain free of any psychosocial barriers and continue to exhibit a positive and healthy lifestyle.    Interventions Encouraged to attend Cardiac Rehabilitation for the exercise    Continue Psychosocial Services  No Follow up required             Vocational Rehabilitation: Provide vocational rehab assistance to qualifying candidates.   Vocational Rehab Evaluation & Intervention:  Vocational Rehab - 07/12/21 1517       Initial Vocational Rehab Evaluation & Intervention   Assessment shows need  for Vocational Rehabilitation No   Keilyn works part time and does not need vocational rehab at this time.            Education: Education Goals: Education classes will be provided on  a weekly basis, covering required topics. Participant will state understanding/return demonstration of topics presented.  Learning Barriers/Preferences:  Learning Barriers/Preferences - 07/12/21 1108       Learning Barriers/Preferences   Learning Barriers Sight;Hearing   Patient wears contacts and [place]   Learning Preferences Group Instruction;Individual Instruction;Skilled Demonstration             Education Topics: Count Your Pulse:  -Group instruction provided by verbal instruction, demonstration, patient participation and written materials to support subject.  Instructors address importance of being able to find your pulse and how to count your pulse when at home without a heart monitor.  Patients get hands on experience counting their pulse with staff help and individually.   Heart Attack, Angina, and Risk Factor Modification:  -Group instruction provided by verbal instruction, video, and written materials to support subject.  Instructors address signs and symptoms of angina and heart attacks.    Also discuss risk factors for heart disease and how to make changes to improve heart health risk factors.   Functional Fitness:  -Group instruction provided by verbal instruction, demonstration, patient participation, and written materials to support subject.  Instructors address safety measures for doing things around the house.  Discuss how to get up and down off the floor, how to pick things up properly, how to safely get out of a chair without assistance, and balance training.   Meditation and Mindfulness:  -Group instruction provided by verbal instruction, patient participation, and written materials to support subject.  Instructor addresses importance of mindfulness and meditation practice to help  reduce stress and improve awareness.  Instructor also leads participants through a meditation exercise.    Stretching for Flexibility and Mobility:  -Group instruction provided by verbal instruction, patient participation, and written materials to support subject.  Instructors lead participants through series of stretches that are designed to increase flexibility thus improving mobility.  These stretches are additional exercise for major muscle groups that are typically performed during regular warm up and cool down.   Hands Only CPR:  -Group verbal, video, and participation provides a basic overview of AHA guidelines for community CPR. Role-play of emergencies allow participants the opportunity to practice calling for help and chest compression technique with discussion of AED use.   Hypertension: -Group verbal and written instruction that provides a basic overview of hypertension including the most recent diagnostic guidelines, risk factor reduction with self-care instructions and medication management.    Nutrition I class: Heart Healthy Eating:  -Group instruction provided by PowerPoint slides, verbal discussion, and written materials to support subject matter. The instructor gives an explanation and review of the Therapeutic Lifestyle Changes diet recommendations, which includes a discussion on lipid goals, dietary fat, sodium, fiber, plant stanol/sterol esters, sugar, and the components of a well-balanced, healthy diet.   Nutrition II class: Lifestyle Skills:  -Group instruction provided by PowerPoint slides, verbal discussion, and written materials to support subject matter. The instructor gives an explanation and review of label reading, grocery shopping for heart health, heart healthy recipe modifications, and ways to make healthier choices when eating out.   Diabetes Question & Answer:  -Group instruction provided by PowerPoint slides, verbal discussion, and written materials to  support subject matter. The instructor gives an explanation and review of diabetes co-morbidities, pre- and post-prandial blood glucose goals, pre-exercise blood glucose goals, signs, symptoms, and treatment of hypoglycemia and hyperglycemia, and foot care basics.   Diabetes Blitz:  -Group instruction provided by [employer], verbal discussion, and written  materials to support subject matter. The instructor gives an explanation and review of the physiology behind type 1 and type 2 diabetes, diabetes medications and rational behind using different medications, pre- and post-prandial blood glucose recommendations and Hemoglobin A1c goals, diabetes diet, and exercise including blood glucose guidelines for exercising safely.    Portion Distortion:  -Group instruction provided by PowerPoint slides, verbal discussion, written materials, and food models to support subject matter. The instructor gives an explanation of serving size versus portion size, changes in portions sizes over the last 20 years, and what consists of a serving from each food group.   Stress Management:  -Group instruction provided by verbal instruction, video, and written materials to support subject matter.  Instructors review role of stress in heart disease and how to cope with stress positively.     Exercising on Your Own:  -Group instruction provided by verbal instruction, power point, and written materials to support subject.  Instructors discuss benefits of exercise, components of exercise, frequency and intensity of exercise, and end points for exercise.  Also discuss use of nitroglycerin and activating EMS.  Review options of places to exercise outside of rehab.  Review guidelines for sex with heart disease.   Cardiac Drugs I:  -Group instruction provided by verbal instruction and written materials to support subject.  Instructor reviews cardiac drug classes: antiplatelets, anticoagulants, beta blockers, and statins.   Instructor discusses reasons, side effects, and lifestyle considerations for each drug class.   Cardiac Drugs II:  -Group instruction provided by verbal instruction and written materials to support subject.  Instructor reviews cardiac drug classes: angiotensin converting enzyme inhibitors (ACE-I), angiotensin II receptor blockers (ARBs), nitrates, and calcium channel blockers.  Instructor discusses reasons, side effects, and lifestyle considerations for each drug class.   Anatomy and Physiology of the Circulatory System:  Group verbal and written instruction and models provide basic cardiac anatomy and physiology, with the coronary electrical and arterial systems. Review of: AMI, Angina, Valve disease, Heart Failure, Peripheral Artery Disease, Cardiac Arrhythmia, Pacemakers, and the ICD.   Other Education:  -Group or individual verbal, written, or video instructions that support the educational goals of the cardiac rehab program.   Holiday Eating Survival Tips:  -Group instruction provided by PowerPoint slides, verbal discussion, and written materials to support subject matter. The instructor gives patients tips, tricks, and techniques to help them not only survive but enjoy the holidays despite the onslaught of food that accompanies the holidays.   Knowledge Questionnaire Score:  Knowledge Questionnaire Score - 07/12/21 1110       Knowledge Questionnaire Score   Pre Score 19/24             Core Components/Risk Factors/Patient Goals at Admission:  Personal Goals and Risk Factors at Admission - 07/12/21 1118       Core Components/Risk Factors/Patient Goals on Admission    Weight Management Yes    Intervention Weight Management: Develop a combined nutrition and exercise program designed to reach desired caloric intake, while maintaining appropriate intake of nutrient and fiber, sodium and fats, and appropriate energy expenditure required for the weight goal.;Weight Management:  Provide education and appropriate resources to help participant work on and attain dietary goals.;Weight Management/Obesity: Establish reasonable short term and long term weight goals.    Expected Outcomes Short Term: Continue to assess and modify interventions until short term weight is achieved;Long Term: Adherence to nutrition and physical activity/exercise program aimed toward attainment of established weight goal;Understanding recommendations for meals to include  15-35% energy as protein, 25-35% energy from fat, 35-60% energy from carbohydrates, less than 236m of dietary cholesterol, 20-35 gm of total fiber daily;Understanding of distribution of calorie intake throughout the day with the consumption of 4-5 meals/snacks    Diabetes Yes    Intervention Provide education about signs/symptoms and action to take for hypo/hyperglycemia.;Provide education about proper nutrition, including hydration, and aerobic/resistive exercise prescription along with prescribed medications to achieve blood glucose in normal ranges: Fasting glucose 65-99 mg/dL    Expected Outcomes Short Term: Participant verbalizes understanding of the signs/symptoms and immediate care of hyper/hypoglycemia, proper foot care and importance of medication, aerobic/resistive exercise and nutrition plan for blood glucose control.;Long Term: Attainment of HbA1C < 7%.    Hypertension Yes    Intervention Provide education on lifestyle modifcations including regular physical activity/exercise, weight management, moderate sodium restriction and increased consumption of fresh fruit, vegetables, and low fat dairy, alcohol moderation, and smoking cessation.;Monitor prescription use compliance.    Expected Outcomes Long Term: Maintenance of blood pressure at goal levels.;Short Term: Continued assessment and intervention until BP is < 140/940mHG in hypertensive participants. < 130/8046mG in hypertensive participants with diabetes, heart failure or  chronic kidney disease.    Lipids Yes    Intervention Provide education and support for participant on nutrition & aerobic/resistive exercise along with prescribed medications to achieve LDL <48m58mDL >40mg66m Expected Outcomes Short Term: Participant states understanding of desired cholesterol values and is compliant with medications prescribed. Participant is following exercise prescription and nutrition guidelines.;Long Term: Cholesterol controlled with medications as prescribed, with individualized exercise RX and with personalized nutrition plan. Value goals: LDL < 48mg,62m > 40 mg.    Personal Goal Other Yes    Personal Goal Short term goal: R.D consult and sugar control, Long term goal: Get in shape and walk further w/o SOB.    Intervention Will continue to monitor patient and progress worload without sign or symptom    Expected Outcomes Patient will acheive their goals.             Core Components/Risk Factors/Patient Goals Review:   Goals and Risk Factor Review     Row Name 08/07/21 0931 08/24/21 1226 09/25/21 1501         Core Components/Risk Factors/Patient Goals Review   Personal Goals Review Weight Management/Obesity;Diabetes;Other;Hypertension;Lipids;Improve shortness of breath with ADL's Weight Management/Obesity;Diabetes;Other;Hypertension;Lipids;Improve shortness of breath with ADL's Weight Management/Obesity;Diabetes;Other;Hypertension;Lipids;Improve shortness of breath with ADL's     Review Emitt Corryompleted 8 exercise sessions in Cardiac rehab.  Pt is off to a good start with good attendance. Ruger Bryam .6kg weight loss since the beginning.  Vital signs have been stable with the exception of reported blood glucose readings from home which range in mid 200 range.  Despite nutritional counseling currently as well as pevious participations, Aharon Batestruggled to manage his diabeties, latest A1C- 8.3.  Nate Masets shortness of breath when walking at home particularily  with incline. Pt denies any difference when he took Lasix for 3 days.  Pt denies COPD.  Shortness of breath could be the result of his body hiatius. Continue to work toward his personal goal for "sugar control" and getting in shape to be able to walk further with no shortness of breath. Rasaan Kalabompleted15 exercise sessions. rdiac rehab. Cline Jarvis.2 weight loss. Vital signs including O2 sat and CBG have been within normal limits Particularly with his CBG.  He has had 1 hypoglycemic incident as  a result of over treating with insulin.  Despite feelings of shortness of breath - O2 sat remain within normal limits.  Rayjon has upcoming appt with new endocrinolgist and is hopeful that this wil also aid in diabetes management to achieve his personal goal for improve "sugar contol" zyrion coey struggle with his shortness of breath continued efforts toward his personal goal to be able to walk further with no shortness of breath. Traven has completed 26 exercise sessions and will graduate on 7/28.  Cagney maintains a 2.4 lb weight loss.  Vital signs including CBG have improved within normal limits. Mansa has a better understanding on how to treat/manage his low as well as high readings.  He has an upcoming appt with a new endocrinologist and hopes this will also help his mangament.  Shortness of breath remains an issue particularily on the treadmill.  Moved him from the treadmill to the track where he averages 20+ laps in a 15 minute period of time.  Denies any shortness of breath.  Vivaan has an upcoming appt with his primary MD where he plans to ask for pulmonary consult to rule out any lung disease process which may be attributing to his shortness of breath.     Expected Outcomes Mardell will continue to engage in Cardiac Rehab for exercise, nutrition and adapatation of heart healthy lifestyle modifications. Mandela will continue to engage in Cardiac Rehab for exercise, nutrition and adapatation of heart healthy  lifestyle modifications. Audric will continue to engage in Cardiac Rehab for exercise, nutrition and adapatation of heart healthy lifestyle modifications.              Core Components/Risk Factors/Patient Goals at Discharge (Final Review):   Goals and Risk Factor Review - 09/25/21 1501       Core Components/Risk Factors/Patient Goals Review   Personal Goals Review Weight Management/Obesity;Diabetes;Other;Hypertension;Lipids;Improve shortness of breath with ADL's    Review Jonothan has completed 26 exercise sessions and will graduate on 7/28.  Ibrahim maintains a 2.4 lb weight loss.  Vital signs including CBG have improved within normal limits. Donaven has a better understanding on how to treat/manage his low as well as high readings.  He has an upcoming appt with a new endocrinologist and hopes this will also help his mangament.  Shortness of breath remains an issue particularily on the treadmill.  Moved him from the treadmill to the track where he averages 20+ laps in a 15 minute period of time.  Denies any shortness of breath.  Dandy has an upcoming appt with his primary MD where he plans to ask for pulmonary consult to rule out any lung disease process which may be attributing to his shortness of breath.    Expected Outcomes Khalon will continue to engage in Cardiac Rehab for exercise, nutrition and adapatation of heart healthy lifestyle modifications.             ITP Comments:  ITP Comments     Row Name 07/12/21 0819 08/07/21 0957 08/24/21 1224 09/26/21 0836     ITP Comments Dr Fransico Him MD, Medical Director 30 day ITP Review.  Araceli is off to a good start. 30 day ITP Review 30 day ITP Review with anticipated graduation date 7/28             Comments: Pt is making expected progress toward personal goals after completing  25 sessions. Recommend continued exercise and life style modification education including  stress management and relaxation techniques to decrease cardiac risk  profile. Cherre Huger, BSN Cardiac and Training and development officer

## 2021-09-28 ENCOUNTER — Encounter (HOSPITAL_COMMUNITY)
Admission: RE | Admit: 2021-09-28 | Discharge: 2021-09-28 | Disposition: A | Discharge status: Home / Self Care | Payer: Medicare Other | Source: Ambulatory Visit | Attending: Cardiology | Admitting: Cardiology

## 2021-09-28 ENCOUNTER — Ambulatory Visit: Payer: Medicare Other | Admitting: Nurse Practitioner

## 2021-09-28 DIAGNOSIS — Z955 Presence of coronary angioplasty implant and graft: Secondary | ICD-10-CM | POA: Diagnosis not present

## 2021-09-28 NOTE — Progress Notes (Signed)
Pt graduates from [facility] cardiac rehab program today with completion of   28 exercise and education sessions. Pt maintained good attendance and progressed nicely during their participation in rehab as evidenced by increased MET level.   Medication list reconciled. Repeat  PHQ score- 2 primarily with his continued issues associated with his breathing/shortness of breath. Pt has made significant lifestyle changes and should be commended for their success.  Thedford has made progress toward the achievement of his goals during cardiac rehab.  Allyn remains hopeful a pulmonary consult will be beneficial in determining the reason for his shortness of breath. Pt plans to continue exercise at the [facility]. Cherre Huger, BSN Cardiac and Training and development officer

## 2021-09-28 NOTE — Progress Notes (Signed)
Discharge Progress Report  Patient Details  Name: Kenneth Kunath Sr. MRN: [id number] Date of Birth: Dec 28, 1950 Referring Provider:   Flowsheet Row CARDIAC REHAB PHASE II ORIENTATION from 07/12/2021 in [place]  Referring Provider Dr. Adrian Prows MD        Number of Visits: 28  Reason for Discharge:  Patient reached a stable level of exercise. Patient independent in their exercise. Patient has met program and personal goals.  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 Adrian Prows MD    Expected Discharge Date 09/07/21      Treadmill   MPH 2.5    Grade 0    Minutes 15    METs 2.91      NuStep   Level 2    SPM 80    Minutes 15    METs 2.3      Prescription Details   Frequency (times per week) 3    Duration Progress to 30 minutes of continuous aerobic without signs/symptoms of physical distress      Intensity   THRR 40-80% of Max Heartrate 60-119    Ratings of Perceived Exertion 11-13    Perceived Dyspnea 0-4      Progression   Progression Continue progressive overload as per policy without signs/symptoms or physical distress.      Resistance Training   Training Prescription Yes    Weight 3    Reps 10-15             Discharge Exercise Prescription (Final Exercise Prescription Changes):  Exercise Prescription Changes - 09/28/21 0834       Response to Exercise   Blood Pressure (Admit) 130/70    Blood Pressure (Exercise) 118/60    Blood Pressure (Exit) 118/62    Heart Rate (Admit) 74 bpm    Heart Rate (Exercise) 81 bpm    Heart Rate (Exit) 66 bpm    Oxygen Saturation (Admit) 95 %    Oxygen Saturation (Exercise) 96 %    Oxygen Saturation  (Exit) 97 %    Rating of Perceived Exertion (Exercise) 14    Perceived Dyspnea (Exercise) 5    Symptoms SOB during warm up   Recovered with rest   Comments Pt graduated the CRP2 program    Duration Progress to 30 minutes of  aerobic without signs/symptoms of physical distress    Intensity THRR unchanged      Progression   Progression Continue to progress workloads to maintain intensity without signs/symptoms of physical distress.    Average METs 2.46      Resistance Training   Training Prescription Yes    Weight 4    Reps 10-15    Time 10 Minutes      NuStep   Level 4    SPM 100    Minutes 15    METs 2.3      Track   Laps 14   SOB on TM, moved to track for last few sessions   Minutes 15    METs 2.62      Home Exercise Plan   Plans to continue exercise at Home (comment)  Frequency Add 2 additional days to program exercise sessions.    Initial Home Exercises Provided 08/06/21             Functional Capacity:  6 Minute Walk     Row Name 07/12/21 0936 09/24/21 0839       6 Minute Walk   Phase Initial Discharge    Distance 1430 feet 2333 feet    Distance % Change -- 65.15 %    Distance Feet Change -- 903 ft    Walk Time 6 minutes 6 minutes    # of Rest Breaks 0 0    MPH 2.71 4.4    METS 2.91 4.88    RPE 11 13    Perceived Dyspnea  0 0    VO2 Peak 10.19 16.97    Symptoms Yes (comment) Yes (comment)    Comments Bliateral hip pain 1/10, Relieved by rest bilateral calf muscle pain, 5/10. Resolved with rest.    Resting HR 74 bpm 87 bpm    Resting BP 124/70 148/80    Resting Oxygen Saturation  97 % 96 %    Exercise Oxygen Saturation  during 6 min walk 97 % 97 %    Max Ex. HR 93 bpm 111 bpm    Max Ex. BP 150/64 174/60    2 Minute Post BP 134/68 128/60             Psychological, QOL, Others - Outcomes: PHQ 2/9:    09/28/2021    3:12 PM 07/12/2021    3:20 PM 01/18/2021   10:00 AM 03/06/2020   10:03 AM 03/02/2020    9:29 AM  Depression screen PHQ 2/9   Decreased Interest 0 0 0 0 0  Down, Depressed, Hopeless 0 0 0 0 0  PHQ - 2 Score 0 0 0 0 0  Altered sleeping 0      Tired, decreased energy 1      Change in appetite 0      Feeling bad or failure about yourself  0      Trouble concentrating 1      Moving slowly or fidgety/restless 0      Suicidal thoughts 0      PHQ-9 Score 2      Difficult doing work/chores Not difficult at all        Quality of Life:  Quality of Life - 09/28/21 0830       Quality of Life   Select Quality of Life      Quality of Life Scores   Health/Function Post 16.96 %    Socioeconomic Post 25.57 %    Psych/Spiritual Post 26.57 %    Family Post 28.8 %    GLOBAL Post 22.8 %             Personal Goals: Goals established at orientation with interventions provided to work toward goal.  Personal Goals and Risk Factors at Admission - 07/12/21 1118       Core Components/Risk Factors/Patient Goals on Admission    Weight Management Yes    Intervention Weight Management: Develop a combined nutrition and exercise program designed to reach desired caloric intake, while maintaining appropriate intake of nutrient and fiber, sodium and fats, and appropriate energy expenditure required for the weight goal.;Weight Management: Provide education and appropriate resources to help participant work on and attain dietary goals.;Weight Management/Obesity: Establish reasonable short term and long term weight goals.    Expected Outcomes Short Term: Continue to assess and  modify interventions until short term weight is achieved;Long Term: Adherence to nutrition and physical activity/exercise program aimed toward attainment of established weight goal;Understanding recommendations for meals to include 15-35% energy as protein, 25-35% energy from fat, 35-60% energy from carbohydrates, less than 247m of dietary cholesterol, 20-35 gm of total fiber daily;Understanding of distribution of calorie intake throughout the day with the  consumption of 4-5 meals/snacks    Diabetes Yes    Intervention Provide education about signs/symptoms and action to take for hypo/hyperglycemia.;Provide education about proper nutrition, including hydration, and aerobic/resistive exercise prescription along with prescribed medications to achieve blood glucose in normal ranges: Fasting glucose 65-99 mg/dL    Expected Outcomes Short Term: Participant verbalizes understanding of the signs/symptoms and immediate care of hyper/hypoglycemia, proper foot care and importance of medication, aerobic/resistive exercise and nutrition plan for blood glucose control.;Long Term: Attainment of HbA1C < 7%.    Hypertension Yes    Intervention Provide education on lifestyle modifcations including regular physical activity/exercise, weight management, moderate sodium restriction and increased consumption of fresh fruit, vegetables, and low fat dairy, alcohol moderation, and smoking cessation.;Monitor prescription use compliance.    Expected Outcomes Long Term: Maintenance of blood pressure at goal levels.;Short Term: Continued assessment and intervention until BP is < 140/938mHG in hypertensive participants. < 130/8036mG in hypertensive participants with diabetes, heart failure or chronic kidney disease.    Lipids Yes    Intervention Provide education and support for participant on nutrition & aerobic/resistive exercise along with prescribed medications to achieve LDL <64m37mDL >40mg67m Expected Outcomes Short Term: Participant states understanding of desired cholesterol values and is compliant with medications prescribed. Participant is following exercise prescription and nutrition guidelines.;Long Term: Cholesterol controlled with medications as prescribed, with individualized exercise RX and with personalized nutrition plan. Value goals: LDL < 64mg,33m > 40 mg.    Personal Goal Other Yes    Personal Goal Short term goal: R.D consult and sugar control, Long term  goal: Get in shape and walk further w/o SOB.    Intervention Will continue to monitor patient and progress worload without sign or symptom    Expected Outcomes Patient will acheive their goals.              Personal Goals Discharge:  Goals and Risk Factor Review     Row Name 08/07/21 0931 08/24/21 1226 09/25/21 1501 09/28/21 0726 09/28/21 1513     Core Components/Risk Factors/Patient Goals Review   Personal Goals Review Weight Management/Obesity;Diabetes;Other;Hypertension;Lipids;Improve shortness of breath with ADL's Weight Management/Obesity;Diabetes;Other;Hypertension;Lipids;Improve shortness of breath with ADL's Weight Management/Obesity;Diabetes;Other;Hypertension;Lipids;Improve shortness of breath with ADL's Weight Management/Obesity;Hypertension Weight Management/Obesity;Hypertension   Review Zubin Brandyompleted 8 exercise sessions in Cardiac rehab.  Pt is off to a good start with good attendance. Haden Dann .6kg weight loss since the beginning.  Vital signs have been stable with the exception of reported blood glucose readings from home which range in mid 200 range.  Despite nutritional counseling currently as well as pevious participations, Jasean Kirontruggled to manage his diabeties, latest A1C- 8.3.  Arleigh Shjonts shortness of breath when walking at home particularily with incline. Pt denies any difference when he took Lasix for 3 days.  Pt denies COPD.  Shortness of breath could be the result of his body hiatius. Continue to work toward his personal goal for "sugar control" and getting in shape to be able to walk further with no shortness of breath. Brennan Kaylinompleted15 exercise sessions. rdiac rehab. RalphDeidre Ala  has 2.2 weight loss. Vital signs including O2 sat and CBG have been within normal limits Particularly with his CBG.  He has had 1 hypoglycemic incident as a result of over treating with insulin.  Despite feelings of shortness of breath - O2 sat remain within normal limits.  Tavarus  has upcoming appt with new endocrinolgist and is hopeful that this wil also aid in diabetes management to achieve his personal goal for improve "sugar contol" marin milley struggle with his shortness of breath continued efforts toward his personal goal to be able to walk further with no shortness of breath. Sherwood has completed 26 exercise sessions and will graduate on 7/28.  Tamir maintains a 2.4 lb weight loss.  Vital signs including CBG have improved within normal limits. Jaquari has a better understanding on how to treat/manage his low as well as high readings.  He has an upcoming appt with a new endocrinologist and hopes this will also help his mangament.  Shortness of breath remains an issue particularily on the treadmill.  Moved him from the treadmill to the track where he averages 20+ laps in a 15 minute period of time.  Denies any shortness of breath.  Eusevio has an upcoming appt with his primary MD where he plans to ask for pulmonary consult to rule out any lung disease process which may be attributing to his shortness of breath. -- Zymere has completed 26 exercise sessions and will graduate on 7/28.  Hendryx maintains a 2.4 lb weight loss.  Vital signs including CBG have improved within normal limits. Aarron has a better understanding on how to treat/manage his low as well as high readings.  He has an upcoming appt with a new endocrinologist and hopes this will also help his mangament.  Shortness of breath remains an issue particularily on the treadmill.  Moved him from the treadmill to the track where he averages 20+ laps in a 15 minute period of time.  Denies any shortness of breath.  Dov has an upcoming appt with his primary MD where he plans to ask for pulmonary consult to rule out any lung disease process which may be attributing to his shortness of breath.   Expected Outcomes Bennet will continue to engage in Cardiac Rehab for exercise, nutrition and adapatation of heart healthy lifestyle  modifications. Remington will continue to engage in Cardiac Rehab for exercise, nutrition and adapatation of heart healthy lifestyle modifications. Trentin will continue to engage in Cardiac Rehab for exercise, nutrition and adapatation of heart healthy lifestyle modifications. Vijay plan to continue to exercise regimen through [facility], he completed 28 sessions Yashar plan to continue to exercise regimen through [facility], he completed 28 sessions            Exercise Goals and Review:  Exercise Goals     Row Name 07/12/21 1104             Exercise Goals   Increase Physical Activity Yes       Intervention Provide advice, education, support and counseling about physical activity/exercise needs.;Develop an individualized exercise prescription for aerobic and resistive training based on initial evaluation findings, risk stratification, comorbidities and participant's personal goals.       Expected Outcomes Short Term: Attend rehab on a regular basis to increase amount of physical activity.;Long Term: Add in home exercise to make exercise part of routine and to increase amount of physical activity.;Long Term: Exercising regularly at least 3-5 days a week.       Increase Strength  and Stamina Yes       Intervention Provide advice, education, support and counseling about physical activity/exercise needs.;Develop an individualized exercise prescription for aerobic and resistive training based on initial evaluation findings, risk stratification, comorbidities and participant's personal goals.       Expected Outcomes Short Term: Increase workloads from initial exercise prescription for resistance, speed, and METs.;Short Term: Perform resistance training exercises routinely during rehab and add in resistance training at home;Long Term: Improve cardiorespiratory fitness, muscular endurance and strength as measured by increased METs and functional capacity (6MWT)       Able to understand and use rate of perceived  exertion (RPE) scale Yes       Intervention Provide education and explanation on how to use RPE scale       Expected Outcomes Short Term: Able to use RPE daily in rehab to express subjective intensity level;Long Term:  Able to use RPE to guide intensity level when exercising independently       Knowledge and understanding of Target Heart Rate Range (THRR) Yes       Intervention Provide education and explanation of THRR including how the numbers were predicted and where they are located for reference       Expected Outcomes Short Term: Able to state/look up THRR;Long Term: Able to use THRR to govern intensity when exercising independently;Short Term: Able to use daily as guideline for intensity in rehab       Understanding of Exercise Prescription Yes       Intervention Provide education, explanation, and written materials on patient's individual exercise prescription       Expected Outcomes Short Term: Able to explain program exercise prescription;Long Term: Able to explain home exercise prescription to exercise independently                Exercise Goals Re-Evaluation:  Exercise Goals Re-Evaluation     Row Name 07/16/21 0924 08/06/21 0830 08/24/21 0841 09/28/21 0838       Exercise Goal Re-Evaluation   Exercise Goals Review Increase Physical Activity;Increase Strength and Stamina;Able to understand and use rate of perceived exertion (RPE) scale;Knowledge and understanding of Target Heart Rate Range (THRR);Understanding of Exercise Prescription Increase Physical Activity;Increase Strength and Stamina;Able to understand and use rate of perceived exertion (RPE) scale;Knowledge and understanding of Target Heart Rate Range (THRR);Understanding of Exercise Prescription Increase Physical Activity;Increase Strength and Stamina;Able to understand and use rate of perceived exertion (RPE) scale;Knowledge and understanding of Target Heart Rate Range (THRR);Understanding of Exercise Prescription Increase  Physical Activity;Increase Strength and Stamina;Able to understand and use rate of perceived exertion (RPE) scale;Knowledge and understanding of Target Heart Rate Range (THRR);Understanding of Exercise Prescription    Comments Pt first day in the CRP2 program. Pt is tolerating exercise well with an average MET level of 2.41. Pt is learning his THRR, RPE and ExRx. Pt feels good about his exercise program. Reviewed MET's, goals and home ExRx. Pt is tolerating exercise well with an average MET level of 2.77. Pt will continue to exercise on his own by walking and golf 2 days a week. Pt plans to ask his [employer] about membership this week and will let us know if he choosees to add that in for exercise. Pt feels great about his goals so far, he has met with the RD, feels in better control of blood sugars, feels like he is getting in to shape slowy and is working on  wt loss. Pt states SOB is doing really well and he  feels in much better control. Reviewed MET's, and goals. Pt is tolerating exercise well with an average MET level of 3.02. Pt feels much better about controling his blood glucose, and is in better control of his SOB. Pt has lost about 6 lbs since starting at [facility] and feels like he is in beter shape. Overall pt is doing well Pt graduated the CRP2 program. Pt is tolerating exercise well with an average MET level of 2.46. Pt will continue to exercise at the [facility] silver sneakers program for 3 days a week 30-45 mins per session. Pt has experienced an increase in SOB with exertion, pt now has referral for pulmonary to try to find relief. Encourage pt to have a pulse ox for exercise on his own for exercise.    Expected Outcomes will continue to monitor pt and progress workloads as tolerated without sign or symptom. Pt will continue to exercise on his own 2 days a week. Will continue to monitor pt and progress workloads as tolerated without sign or symptom. Will continue to monitor pt and progress workloads as tolerated  without sign or symptom. Pt will continue to exercise and find relief for his SOB             Nutrition & Weight - Outcomes:  Pre Biometrics - 07/12/21 1105       Pre Biometrics   Height 5' 8.25" (1.734 m)    Weight 95 kg    Waist Circumference 44.5 inches    Hip Circumference 43.5 inches    Waist to Hip Ratio 1.02 %    BMI (Calculated) 31.6    Triceps Skinfold 17 mm    % Body Fat 31.7 %    Grip Strength 31 kg    Flexibility 10.5 in    Single Leg Stand 26.8 seconds             Post Biometrics - 09/24/21 0850        Post  Biometrics   Height 5' 8.25" (1.734 m)    Weight 93.9 kg    Waist Circumference 43.5 inches    Hip Circumference 42.5 inches    Waist to Hip Ratio 1.02 %    BMI (Calculated) 31.23    Triceps Skinfold 16 mm    % Body Fat 30.9 %    Grip Strength 32 kg    Flexibility 11.5 in    Single Leg Stand 30 seconds             Nutrition:  Nutrition Therapy & Goals - 09/14/21 0822       Nutrition Therapy   Diet Heart Healthy/ Carbohydrate Consistent      Personal Nutrition Goals   Nutrition Goal Patient to implement a heart healthy meal plan to include fruit, vegetables, whole grains, lean protein/plant protein, and nonfat dairy   in progress   Personal Goal #2 Patient to identify strategies for blood sugar control   The plate method, high fiber/high protein snacks, carbohydrate consistency, etc.   Personal Goal #3 Patient to limit food sources of trans fats, saturated fats, refined carbohydrates and sodium   in progress   Comments Goals in progress. Patient has reduced meal time insulin (by approximately half); he reports decreased blood sugar lows. He continues to work on eating on a consistent schedule and choosing high fiber foods.      Intervention Plan   Intervention Prescribe, educate and counsel regarding individualized specific dietary modifications aiming towards targeted core components such as weight,  hypertension, lipid management,  diabetes, heart failure and other comorbidities.    Expected Outcomes Short Term Goal: Understand basic principles of dietary content, such as calories, fat, sodium, cholesterol and nutrients.;Long Term Goal: Adherence to prescribed nutrition plan.             Nutrition Discharge:  Nutrition Assessments - 10/04/21 0833       Rate Your Plate Scores   Post Score 57             Education Questionnaire Score:  Knowledge Questionnaire Score - 09/28/21 1046       Knowledge Questionnaire Score   Post Score 19/24             Goals reviewed with patient Glen  graduates from [place] cardiac rehab program today with completion of  28 exercise and education sessions. Pt maintained good attendance and progressed nicely during their participation in rehab as evidenced by increased MET level.   Medication list reconciled. Repeat  PHQ score-2 which is an increase for him.  Continues to have residual effects of Covid with energy/fatigue and difficulty with concentration feeling like he is in a fog.  Pt has made significant lifestyle changes and should be commended for their success.  Damani achieved their goals during cardiac rehab.   Pt plans to continue exercise at [facility]. Cherre Huger, BSN Cardiac and Training and development officer

## 2021-10-04 ENCOUNTER — Other Ambulatory Visit: Payer: Self-pay | Admitting: Nurse Practitioner

## 2021-10-04 DIAGNOSIS — J410 Simple chronic bronchitis: Secondary | ICD-10-CM

## 2021-10-04 DIAGNOSIS — H2513 Age-related nuclear cataract, bilateral: Secondary | ICD-10-CM | POA: Diagnosis not present

## 2021-10-04 DIAGNOSIS — E119 Type 2 diabetes mellitus without complications: Secondary | ICD-10-CM | POA: Diagnosis not present

## 2021-10-04 LAB — HM DIABETES EYE EXAM

## 2021-10-04 NOTE — Progress Notes (Signed)
Careteam: Patient Care Team: Lauree Chandler, NP as PCP - General (Geriatric Medicine) Adrian Prows, MD as Consulting Physician (Cardiology) Cameron Sprang, MD as Consulting Physician (Neurology) [place], [name], Hunter (Optometry) Melida Quitter, MD as Consulting Physician (Otolaryngology)  PLACE OF SERVICE:  [place]  Advanced Directive information    Allergies  Allergen Reactions   Amlodipine Other (See Comments)    Worsening restless legs, dyspnea and leg swelling   Lyrica [Pregabalin] Other (See Comments)    Made patient very lethargic the next morning, very hard to patient to function, move, etc.    Statins Other (See Comments)    Causes Restless Legs, rhabdomyolysis   Ativan [Lorazepam] Other (See Comments)    Markedly increased restless legs   Trulicity [Dulaglutide] Other (See Comments)    GI side effects     Chief Complaint  Patient presents with   Acute Visit    Referral request to lung specialist. Examine left shoulder for off/on shoulder pain related to a fall 2 months ago.      HPI: Patient is a 71 y.o. male who presents with acute dyspnea with exertion. Has been in cardiac rehab and had a few episodes of acute dyspnea w/o hypoxia. States that usually he will start some type of exercise, more recently while at cardiac rehab, and will just lose his breath. At these times he will stop and rest and usually recover within a couple of minutes. Denies cough, wheezing, and general fatigue. Endorses swelling in his ankles which is his baseline. Would like a referral to pulmonology. Feels like some of his symptoms are long term due to [place]   Also presents with left shoulder discomfort after a fall a couple of months ago. Has ROM but increase pain with range of motion upward. Slight weakness to left arm compared to right.   Also states his RLS is not improving, previously made a referral to neurologist and has an appointment for 8/21st.   Diabetes has been hard to  control- missed last endocrine appt but has follow up later this month.   Review of Systems:  Review of Systems  Constitutional:  Negative for chills, fever and weight loss.  HENT:  Negative for congestion and sore throat.   Respiratory:  Positive for shortness of breath (w/ exertion) and wheezing. Negative for cough.   Cardiovascular:  Positive for leg swelling. Negative for chest pain and palpitations.  Gastrointestinal:  Negative for abdominal pain, constipation, diarrhea and nausea.  Genitourinary:  Negative for dysuria.  Musculoskeletal:  Positive for joint pain.  Skin:  Negative for rash.  Neurological:  Positive for tingling. Negative for dizziness and headaches.  Endo/Heme/Allergies:  Positive for polydipsia.  Psychiatric/Behavioral:  The patient is not nervous/anxious.     Past Medical History:  Diagnosis Date   Arthritis    "left thumb" (05/23/2017) right shoulder   Barrett's esophagus    "we've been told that it's all gone; still take RX for GERD" (05/23/2017)   Bilateral swelling of feet and ankles x 3 weeks as of 12-01-2019   CAD (coronary artery disease), native coronary artery    Coronary angiogram 05/03/2014:  Proximal LAD 3.0x12 mm Promus premier stent.  04/08/2014: Mid Cx 3.5 x 16 mm Promus DES.  03/29/2013: Mid LAD 2.75 x 38 mm Promus Premier drug-eluting stent, balloon angioplasty of D1 and distal LAD and stenting of distal RCA with 2.75 x 24 mm promos Premier drug-eluting stent 12/15/2012 widely patent.   Cervicalgia  Chronic bronchitis ([place])    "q yr" (3/38/2505)   Complication of anesthesia    " I do not wake up very well "  slow to wake up   COVID-19 05/2019   all covid symptoms in hospital x 5 days, all symptoms resolved took monoclonal antibody tx    Dyspnea    with exertion   Family history of adverse reaction to anesthesia    "son w/PONV"   GERD (gastroesophageal reflux disease)    Heart murmur    "grew out of it"    Hyperlipidemia    Hypertension     Hypoglycemia, unspecified    IDDM (insulin dependent diabetes mellitus)    type 2   Impotence of organic origin    Iron deficiency anemia    Memory loss    mild   Other malaise and fatigue    Pneumonia 05/2019   PONV (postoperative nausea and vomiting)    after wisdom teeth pulled, no problems with other surgeries   Restless leg    Rotator cuff arthropathy    Stroke ([place])    Tension headache    "sometimes" (05/23/2017)   Unspecified gastritis and gastroduodenitis with hemorrhage    Unstable angina pectoris ([place]) 04/08/2014   Coronary angiogram 05/03/2014:  Proximal LAD 3.0x12 mm Promus premier stent.  04/08/2014: Mid Cx 3.5 x 16 mm Promus DES.  03/29/2013: Mid LAD 2.75 x 38 mm Promus Premier drug-eluting stent, balloon angioplasty of D1 and distal LAD and stenting of distal RCA with 2.75 x 24 mm promos Premier drug-eluting stent 12/15/2012 widely patent.   Past Surgical History:  Procedure Laterality Date   BICEPT TENODESIS Right 12/07/2019   Procedure: RIGHT BICEPS TENODESIS;  Surgeon: Renette Butters, MD;  Location: [facility];  Service: Orthopedics;  Laterality: Right;   CARDIAC CATHETERIZATION N/A 09/25/2015   Procedure: Left Heart Cath and Coronary Angiography;  Surgeon: Adrian Prows, MD;  Location: [place] [facility];  Service: Cardiovascular;  Laterality: N/A;   CARDIAC CATHETERIZATION N/A 09/25/2015   Procedure: Intravascular Pressure Wire/FFR Study;  Surgeon: Adrian Prows, MD;  Location: [place] [facility];  Service: Cardiovascular;  Laterality: N/A;   CORONARY ANGIOPLASTY WITH STENT PLACEMENT  12/15/2012; 04/28/2014; 05/03/2014   "2; 1; 1" , 4 stents 2 balloons   CORONARY STENT INTERVENTION N/A 05/01/2021   Procedure: CORONARY STENT INTERVENTION;  Surgeon: Adrian Prows, MD;  Location: [place] [facility];  Service: Cardiovascular;  Laterality: N/A;   FRACTIONAL FLOW RESERVE WIRE Right 03/26/2013   Procedure: FRACTIONAL FLOW RESERVE WIRE;  Surgeon: Laverda Page, MD;   Location: [facility];  Service: Cardiovascular;  Laterality: Right;   FRACTIONAL FLOW RESERVE WIRE N/A 05/03/2014   Procedure: FRACTIONAL FLOW RESERVE WIRE;  Surgeon: Laverda Page, MD;  Location: [facility] LAB;  Service: Cardiovascular;  Laterality: N/A;   HERNIA REPAIR  [id number]   "umbilical"    INCISION AND DRAINAGE ABSCESS Left 05/2007   "groin"    KNEE SURGERY Right 1992;  2000   "calcium deposits removed" (12/15/2012)   LEFT HEART CATH AND CORONARY ANGIOGRAPHY N/A 05/23/2017   Procedure: LEFT HEART CATH AND CORONARY ANGIOGRAPHY;  Surgeon: Adrian Prows, MD;  Location: [place] [facility];  Service: Cardiovascular;  Laterality: N/A;   LEFT HEART CATH AND CORONARY ANGIOGRAPHY N/A 05/01/2021   Procedure: LEFT HEART CATH AND CORONARY ANGIOGRAPHY;  Surgeon: Adrian Prows, MD;  Location: [place] [facility];  Service: Cardiovascular;  Laterality: N/A;   LEFT HEART  CATHETERIZATION WITH CORONARY ANGIOGRAM N/A 12/15/2012   Procedure: LEFT HEART CATHETERIZATION WITH CORONARY ANGIOGRAM;  Surgeon: Laverda Page, MD;  Location: [facility] CATH LAB;  Service: Cardiovascular;  Laterality: N/A;   LEFT HEART CATHETERIZATION WITH CORONARY ANGIOGRAM N/A 03/26/2013   Procedure: LEFT HEART CATHETERIZATION WITH CORONARY ANGIOGRAM;  Surgeon: Laverda Page, MD;  Location: [facility] LAB;  Service: Cardiovascular;  Laterality: N/A;   LEFT HEART CATHETERIZATION WITH CORONARY ANGIOGRAM N/A 04/08/2014   Procedure: LEFT HEART CATHETERIZATION WITH CORONARY ANGIOGRAM;  Surgeon: Blane Ohara, MD;  Location: [facility] CATH LAB;  Service: Cardiovascular;  Laterality: N/A;   MOUTH SURGERY Right    PERCUTANEOUS CORONARY STENT INTERVENTION (PCI-S) N/A 05/03/2014   Procedure: PERCUTANEOUS CORONARY STENT INTERVENTION (PCI-S);  Surgeon: Laverda Page, MD;  Location: [facility] LAB;  Service: Cardiovascular;  Laterality: N/A;   RADIOLOGY WITH ANESTHESIA N/A 02/08/2021   Procedure: MRI WITH ANESTHESIA OF ABDOMEN WITH AND WITHOUT CONSTRAST;   Surgeon: Radiologist, Medication, MD;  Location: [place];  Service: Radiology;  Laterality: N/A;   ROTATOR CUFF REPAIR     UMBILICAL HERNIA REPAIR  yrs ago   Social History:   reports that he has never smoked. He has never used smokeless tobacco. He reports that he does not drink alcohol and does not use drugs.  Family History  Adopted: Yes  Problem Relation Age of Onset   Emphysema Mother     Medications: Patient's Medications  New Prescriptions   No medications on file  Previous Medications   ACETAMINOPHEN (TYLENOL) 500 MG TABLET    Take 500 mg by mouth every 6 (six) hours as needed for mild pain, moderate pain, headache or fever.   ALBUTEROL (VENTOLIN HFA) 108 (90 BASE) MCG/ACT INHALER    TAKE 2 PUFFS BY MOUTH EVERY 6 HOURS AS NEEDED FOR WHEEZE OR SHORTNESS OF BREATH   ASPIRIN EC 81 MG TABLET    Take 1 tablet (81 mg total) by mouth daily. Swallow whole.   BD PEN NEEDLE NANO 2ND GEN 32G X 4 MM MISC    USE AS DIRECTED   BENZONATATE (TESSALON) 100 MG CAPSULE    Take 1 capsule (100 mg total) by mouth 2 (two) times daily as needed for cough.   CLOPIDOGREL (PLAVIX) 75 MG TABLET    Take 1 tablet (75 mg total) by mouth daily.   CONTINUOUS BLOOD GLUC RECEIVER (FREESTYLE LIBRE 14 DAY READER) DEVI    Inject 1 Device as directed daily as needed. Use once daily as direct to check blood sugar E11.51   CONTINUOUS BLOOD GLUC SENSOR (FREESTYLE LIBRE 14 DAY SENSOR) MISC    USE TO TEST BLOOD SUGAR THREE TIMES DAILY. E11.51. E11.59   FLUTICASONE (FLONASE) 50 MCG/ACT NASAL SPRAY    Place 1 spray into both nostrils daily as needed for allergies or rhinitis.   GABAPENTIN (NEURONTIN) 100 MG CAPSULE    TAKE 2 CAPSULES BY MOUTH AT BEDTIME.   GLUCOSE 4 GM CHEWABLE TABLET    Chew 1 tablet (4 g total) by mouth as needed for low blood sugar.   INSULIN ASPART (NOVOLOG FLEXPEN) 100 UNIT/ML FLEXPEN    Inject 25-30 Units into the skin See admin instructions. Inject 30 units subcutaneously in the morning, inject 25 units  subcutaneously with lunch & inject 25 units subcutaneously with dinner.   INSULIN GLARGINE (BASAGLAR KWIKPEN) 100 UNIT/ML    Inject 30 Units into the skin at bedtime.   ISOSORBIDE MONONITRATE (IMDUR) 60 MG 24 HR TABLET  TAKE 1 TABLET BY MOUTH EVERY DAY   LIFESCAN FINEPOINT LANCETS MISC    Use to test for blood sugar three times daily dx: E11.51   METOPROLOL SUCCINATE (TOPROL-XL) 25 MG 24 HR TABLET    Take 1 tablet (25 mg total) by mouth daily. TAKE WITH OR IMMEDIATELY FOLLOWING A MEAL.   NITROGLYCERIN (NITROSTAT) 0.4 MG SL TABLET    PLACE 1 TABLET UNDER THE TONGUE EVERY 5 MINUTES AS NEEDED FOR CHEST PAIN.   ONETOUCH ULTRA TEST STRIP    USE TO TEST FOR BLOOD SUGAR THREE TIMES DAILY DX: E11.51   PANTOPRAZOLE (PROTONIX) 40 MG TABLET    TAKE ONE TABLET BY MOUTH ONCE DAILY FOR STOMACH   PRALUENT 150 MG/ML SOAJ    INJECT 1 PEN INTO THE SKIN EVERY 14 (FOURTEEN) DAYS.   PRAMIPEXOLE (MIRAPEX) 1 MG TABLET    TAKE 1 TABLET BY MOUTH AT 5PM, 1 TABLET BY MOUTH AT 7PM, AND TAKE 2 TABLETS BY MOUTH AT BEDTIME.   VALSARTAN (DIOVAN) 80 MG TABLET    Take 80 mg by mouth every evening.  Modified Medications   No medications on file  Discontinued Medications   AMOXICILLIN (AMOXIL) 500 MG CAPSULE    Take 500 mg by mouth 3 (three) times daily.   FUROSEMIDE (LASIX) 40 MG TABLET    Take 1 tablet (40 mg total) by mouth daily as needed for fluid or edema.   GUAIFENESIN (MUCINEX) 600 MG 12 HR TABLET    Take 600 mg by mouth 2 (two) times daily as needed (congestion/bronchial issues.).   INSULIN LISPRO (HUMALOG KWIKPEN) 100 UNIT/ML KWIKPEN    3 times a day (just before each meal) 30-25-25 units, and pen needles 4/day    Physical Exam:  Vitals:   10/05/21 0816  BP: 138/80  Pulse: 63  Temp: (!) 97.3 F (36.3 C)  TempSrc: Temporal  SpO2: 95%  Weight: 207 lb 12.8 oz (94.3 kg)  Height: 5' 8"  (1.727 m)   Body mass index is 31.6 kg/m. Wt Readings from Last 3 Encounters:  10/05/21 207 lb 12.8 oz (94.3 kg)  09/24/21  207 lb 0.2 oz (93.9 kg)  08/06/21 208 lb (94.3 kg)    Physical Exam Constitutional:      General: He is not in acute distress.    Appearance: Normal appearance.  HENT:     Right Ear: External ear normal.     Left Ear: External ear normal.     Nose: Nose normal.     Mouth/Throat:     Mouth: Mucous membranes are moist.     Pharynx: Oropharynx is clear.  Eyes:     Conjunctiva/sclera: Conjunctivae normal.  Cardiovascular:     Pulses: Normal pulses.     Heart sounds: Normal heart sounds.  Pulmonary:     Effort: Pulmonary effort is normal. No respiratory distress.     Breath sounds: Normal breath sounds. No wheezing.  Abdominal:     General: Bowel sounds are normal.     Palpations: Abdomen is soft.     Tenderness: There is no abdominal tenderness. There is no guarding.  Musculoskeletal:     Right shoulder: Normal.     Left shoulder: Tenderness present. Decreased range of motion.     Right lower leg: Edema (nonpitting, 1+) present.     Left lower leg: Edema (nonpitting, 1+) present.     Comments: Anterior tenderness  Left shoulder pain with flexion at 180 degrees  Skin:    General: Skin is  warm and dry.  Neurological:     Mental Status: He is alert and oriented to person, place, and time. Mental status is at baseline.  Psychiatric:        Mood and Affect: Mood normal.     Labs reviewed: Basic Metabolic Panel: Recent Labs    01/15/21 0811 01/31/21 0919 02/21/21 0912 04/25/21 0949 07/06/21 1504 09/03/21 0826  NA  --    < > 136 142 140  --   K  --    < > 4.9 5.1 5.1  --   CL  --    < > 102 104 102  --   CO2  --    < > 27 24 23   --   GLUCOSE  --    < > 263* 91 130*  --   BUN  --    < > 20 22 20   --   CREATININE  --    < > 1.19 1.35* 1.38*  --   CALCIUM  --    < > 9.3 9.8 9.9  --   MG  --   --   --   --   --  2.6*  TSH 2.684  --   --   --   --   --    < > = values in this interval not displayed.   Liver Function Tests: Recent Labs    02/09/21 0240  02/10/21 1026 02/11/21 1137 02/21/21 0912  AST 74* 52* 53* 22  ALT 194* 151* 129* 37  ALKPHOS 84 87 101  --   BILITOT 1.9* 1.8* 1.4* 0.6  PROT 5.6* 6.4* 6.7 6.3  ALBUMIN 2.9* 3.2* 3.5  --    Recent Labs    02/06/21 1430 02/07/21 1430  LIPASE 24 26  AMYLASE  --  36   No results for input(s): "AMMONIA" in the last 8760 hours. CBC: Recent Labs    01/14/21 1146 01/14/21 1152 02/07/21 0826 02/08/21 0225 02/10/21 1026 02/10/21 1157 02/21/21 0912 04/25/21 0949  WBC 5.6   < > 9.2   < > 5.3  --  5.8 5.3  NEUTROABS 3.9  --  8.4*  --   --   --  4,014  --   HGB 14.2   < > 14.3   < > 14.0  --  13.3 14.5  HCT 42.5   < > 42.3   < > 41.5  --  39.3 43.1  MCV 88.0   < > 88.9   < > 88.3  --  87.9 87  PLT 131*   < > 102*   < > 128* 121* 182 137*   < > = values in this interval not displayed.   Lipid Panel: Recent Labs    01/15/21 0811  CHOL 81  HDL 31*  LDLCALC 15  TRIG 174*  CHOLHDL 2.6   TSH: Recent Labs    01/15/21 0811  TSH 2.684   A1C: Lab Results  Component Value Date   HGBA1C 8.3 (A) 05/07/2021     Assessment/Plan  1. Dyspnea on exertion - appears to be due to deconditioning, pt request pulmonary referral.  -Ambulatory referral to Pulmonology for further evaluation  - CBC with Differential/Platelet  2. Acute pain of left shoulder - Ambulatory referral to Physical Therapy - Instructed to apply ice 3x day - Avoid NSAIDS d/t kidney function  3. Type 2 diabetes mellitus with hyperglycemia, with long-term current use of insulin (HCC) -- discussed with the patient  the pathophysiology of diabetes and the natural progression of the disease.  -stressed the importance of lifestyle changes including diet and exercise. -discussed complications associated with diabetes including retinopathy, nephropathy, neuropathy as well as increased risk of cardiovascular disease. We went over the benefit seen with glycemic control.  -he has adjusted medication to avoid  hypoglycemia.  - Hemoglobin A1c - CBC with Differential/Platelet - CMP with eGFR([facility]) - Encourage healthy eating habits and continued exercise as tolerated  To keep follow up as scheduled.   I personally was present during the history, physical exam and medical decision-making activities of this service and have verified that the service and findings are accurately documented in the student's note Faren [place] K. [place], [facility] [phone]

## 2021-10-05 ENCOUNTER — Ambulatory Visit (INDEPENDENT_AMBULATORY_CARE_PROVIDER_SITE_OTHER): Payer: Medicare Other | Admitting: Nurse Practitioner

## 2021-10-05 ENCOUNTER — Encounter: Payer: Self-pay | Admitting: Nurse Practitioner

## 2021-10-05 VITALS — BP 138/80 | HR 63 | Temp 97.3°F | Ht 68.0 in | Wt 207.8 lb

## 2021-10-05 DIAGNOSIS — M25512 Pain in left shoulder: Secondary | ICD-10-CM

## 2021-10-05 DIAGNOSIS — Z794 Long term (current) use of insulin: Secondary | ICD-10-CM | POA: Diagnosis not present

## 2021-10-05 DIAGNOSIS — R0609 Other forms of dyspnea: Secondary | ICD-10-CM | POA: Diagnosis not present

## 2021-10-05 DIAGNOSIS — E1165 Type 2 diabetes mellitus with hyperglycemia: Secondary | ICD-10-CM | POA: Diagnosis not present

## 2021-10-05 DIAGNOSIS — I209 Angina pectoris, unspecified: Secondary | ICD-10-CM | POA: Diagnosis not present

## 2021-10-06 LAB — CBC WITH DIFFERENTIAL/PLATELET
Absolute Monocytes: 478 cells/uL (ref 200–950)
Basophils Absolute: 31 cells/uL (ref 0–200)
Basophils Relative: 0.6 %
Eosinophils Absolute: 120 cells/uL (ref 15–500)
Eosinophils Relative: 2.3 %
HCT: 39.4 % (ref 38.5–50.0)
Hemoglobin: 13 g/dL — ABNORMAL LOW (ref 13.2–17.1)
Lymphs Abs: 1014 cells/uL (ref 850–3900)
MCH: 28.6 pg (ref 27.0–33.0)
MCHC: 33 g/dL (ref 32.0–36.0)
MCV: 86.6 fL (ref 80.0–100.0)
MPV: 11.4 fL (ref 7.5–12.5)
Monocytes Relative: 9.2 %
Neutro Abs: 3557 cells/uL (ref 1500–7800)
Neutrophils Relative %: 68.4 %
Platelets: 133 10*3/uL — ABNORMAL LOW (ref 140–400)
RBC: 4.55 10*6/uL (ref 4.20–5.80)
RDW: 13.2 % (ref 11.0–15.0)
Total Lymphocyte: 19.5 %
WBC: 5.2 10*3/uL (ref 3.8–10.8)

## 2021-10-06 LAB — COMPLETE METABOLIC PANEL WITH GFR
AG Ratio: 1.8 (calc) (ref 1.0–2.5)
ALT: 28 U/L (ref 9–46)
AST: 22 U/L (ref 10–35)
Albumin: 4.1 g/dL (ref 3.6–5.1)
Alkaline phosphatase (APISO): 58 U/L (ref 35–144)
BUN: 19 mg/dL (ref 7–25)
CO2: 27 mmol/L (ref 20–32)
Calcium: 9.4 mg/dL (ref 8.6–10.3)
Chloride: 102 mmol/L (ref 98–110)
Creat: 1.23 mg/dL (ref 0.70–1.28)
Globulin: 2.3 g/dL (calc) (ref 1.9–3.7)
Glucose, Bld: 237 mg/dL — ABNORMAL HIGH (ref 65–139)
Potassium: 4.8 mmol/L (ref 3.5–5.3)
Sodium: 137 mmol/L (ref 135–146)
Total Bilirubin: 0.5 mg/dL (ref 0.2–1.2)
Total Protein: 6.4 g/dL (ref 6.1–8.1)
eGFR: 62 mL/min/{1.73_m2} (ref >=60)

## 2021-10-06 LAB — HEMOGLOBIN A1C
Hgb A1c MFr Bld: 8.1 % of total Hgb — ABNORMAL HIGH (ref <5.7)
Mean Plasma Glucose: 186 mg/dL
eAG (mmol/L): 10.3 mmol/L

## 2021-10-11 ENCOUNTER — Ambulatory Visit (INDEPENDENT_AMBULATORY_CARE_PROVIDER_SITE_OTHER): Payer: Medicare Other | Admitting: Nurse Practitioner

## 2021-10-11 ENCOUNTER — Telehealth: Payer: Self-pay

## 2021-10-11 ENCOUNTER — Encounter: Payer: Self-pay | Admitting: Nurse Practitioner

## 2021-10-11 DIAGNOSIS — Z0000 Encounter for general adult medical examination without abnormal findings: Secondary | ICD-10-CM

## 2021-10-11 MED ORDER — FLUTICASONE PROPIONATE 50 MCG/ACT NA SUSP: 1.0000 | Freq: Every day | NASAL | 11 refills | Status: AC | PRN

## 2021-10-11 NOTE — Telephone Encounter (Signed)
Mr. iker, nuttall are scheduled for a virtual visit with your provider today.    Just as we do with appointments in the office, we must obtain your consent to participate.  Your consent will be active for this visit and any virtual visit you may have with one of our providers in the next 365 days.    If you have a MyChart account, I can also send a copy of this consent to you electronically.  All virtual visits are billed to your insurance company just like a traditional visit in the office.  As this is a virtual visit, video technology does not allow for your provider to perform a traditional examination.  This may limit your provider's ability to fully assess your condition.  If your provider identifies any concerns that need to be evaluated in person or the need to arrange testing such as labs, EKG, etc, we will make arrangements to do so.    Although advances in technology are sophisticated, we cannot ensure that it will always work on either your end or our end.  If the connection with a video visit is poor, we may have to switch to a telephone visit.  With either a video or telephone visit, we are not always able to ensure that we have a secure connection.   I need to obtain your verbal consent now.   Are you willing to proceed with your visit today?   Basheer Molchan Sr. has provided verbal consent on 10/11/2021 for a virtual visit (video or telephone).   Edison Simon [place], [place] 10/11/2021  8:16 AM

## 2021-10-11 NOTE — Progress Notes (Signed)
Subjective:   Kenneth Pennella Sr. is a 71 y.o. male who presents for Medicare Annual/Subsequent preventive examination.  Review of Systems           Objective:    There were no vitals filed for this visit. There is no height or weight on file to calculate BMI.     10/11/2021    8:15 AM 05/28/2021    1:11 PM 05/01/2021    8:18 AM 03/23/2021   11:21 AM 02/21/2021    8:25 AM 02/06/2021    2:19 PM 02/01/2021    6:31 PM  Advanced Directives  Does Patient Have a Medical Advance Directive? No No No No No No No  Would patient like information on creating a medical advance directive? Yes (MAU/Ambulatory/Procedural Areas - Information given) No - Patient declined No - Patient declined No - Patient declined No - Patient declined  No - Patient declined    Current Medications (verified) Outpatient Encounter Medications as of 10/11/2021  Medication Sig   acetaminophen (TYLENOL) 500 MG tablet Take 500 mg by mouth every 6 (six) hours as needed for mild pain, moderate pain, headache or fever.   albuterol (VENTOLIN HFA) 108 (90 Base) MCG/ACT inhaler TAKE 2 PUFFS BY MOUTH EVERY 6 HOURS AS NEEDED FOR WHEEZE OR SHORTNESS OF BREATH   aspirin EC 81 MG tablet Take 1 tablet (81 mg total) by mouth daily. Swallow whole.   BD PEN NEEDLE NANO 2ND GEN 32G X 4 MM MISC USE AS DIRECTED   clopidogrel (PLAVIX) 75 MG tablet Take 1 tablet (75 mg total) by mouth daily.   Continuous Blood Gluc Receiver (FREESTYLE LIBRE 14 DAY READER) DEVI Inject 1 Device as directed daily as needed. Use once daily as direct to check blood sugar E11.51   Continuous Blood Gluc Sensor (FREESTYLE LIBRE 14 DAY SENSOR) MISC USE TO TEST BLOOD SUGAR THREE TIMES DAILY. E11.51. E11.59   fluticasone (FLONASE) 50 MCG/ACT nasal spray Place 1 spray into both nostrils daily as needed for allergies or rhinitis.   gabapentin (NEURONTIN) 100 MG capsule TAKE 2 CAPSULES BY MOUTH AT BEDTIME.   glucose 4 GM chewable tablet Chew 1 tablet (4 g total) by mouth as  needed for low blood sugar.   insulin aspart (NOVOLOG FLEXPEN) 100 UNIT/ML FlexPen Inject 25-30 Units into the skin See admin instructions. Inject 30 units subcutaneously in the morning, inject 25 units subcutaneously with lunch & inject 25 units subcutaneously with dinner.   Insulin Glargine (BASAGLAR KWIKPEN) 100 UNIT/ML Inject 30 Units into the skin at bedtime.   isosorbide mononitrate (IMDUR) 60 MG 24 hr tablet TAKE 1 TABLET BY MOUTH EVERY DAY   LIFESCAN FINEPOINT LANCETS MISC Use to test for blood sugar three times daily dx: E11.51   metoprolol succinate (TOPROL-XL) 25 MG 24 hr tablet Take 50 mg by mouth daily.   nitroGLYCERIN (NITROSTAT) 0.4 MG SL tablet PLACE 1 TABLET UNDER THE TONGUE EVERY 5 MINUTES AS NEEDED FOR CHEST PAIN.   ONETOUCH ULTRA test strip USE TO TEST FOR BLOOD SUGAR THREE TIMES DAILY DX: E11.51   pantoprazole (PROTONIX) 40 MG tablet TAKE ONE TABLET BY MOUTH ONCE DAILY FOR STOMACH   PRALUENT 150 MG/ML SOAJ INJECT 1 PEN INTO THE SKIN EVERY 14 (FOURTEEN) DAYS.   pramipexole (MIRAPEX) 1 MG tablet TAKE 1 TABLET BY MOUTH AT 5PM, 1 TABLET BY MOUTH AT 7PM, AND TAKE 2 TABLETS BY MOUTH AT BEDTIME.   valsartan (DIOVAN) 80 MG tablet Take 80 mg by mouth every evening.   [  DISCONTINUED] benzonatate (TESSALON) 100 MG capsule Take 1 capsule (100 mg total) by mouth 2 (two) times daily as needed for cough. (Patient not taking: Reported on 10/11/2021)   [DISCONTINUED] metoprolol succinate (TOPROL-XL) 25 MG 24 hr tablet Take 1 tablet (25 mg total) by mouth daily. TAKE WITH OR IMMEDIATELY FOLLOWING A MEAL. (Patient not taking: Reported on 10/11/2021)   Facility-Administered Encounter Medications as of 10/11/2021  Medication   [COMPLETED] hydrALAZINE (APRESOLINE) injection 10 mg    Allergies (verified) Amlodipine, Lyrica [pregabalin], Statins, Ativan [lorazepam], and Trulicity [dulaglutide]   History: Past Medical History:  Diagnosis Date   Arthritis    "left thumb" (05/23/2017) right shoulder    Barrett's esophagus    "we've been told that it's all gone; still take RX for GERD" (05/23/2017)   Bilateral swelling of feet and ankles x 3 weeks as of 12-01-2019   CAD (coronary artery disease), native coronary artery    Coronary angiogram 05/03/2014:  Proximal LAD 3.0x12 mm Promus premier stent.  04/08/2014: Mid Cx 3.5 x 16 mm Promus DES.  03/29/2013: Mid LAD 2.75 x 38 mm Promus Premier drug-eluting stent, balloon angioplasty of D1 and distal LAD and stenting of distal RCA with 2.75 x 24 mm promos Premier drug-eluting stent 12/15/2012 widely patent.   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  04/08/2014: Mid Cx 3.5 x 16 mm Promus DES.  03/29/2013: Mid LAD 2.75 x 38 mm Promus Premier drug-eluting stent, balloon angioplasty of D1 and distal LAD and stenting of distal RCA with 2.75 x 24 mm promos Premier drug-eluting stent 12/15/2012 widely patent.   Past Surgical History:   Procedure Laterality Date   BICEPT TENODESIS Right 12/07/2019   Procedure: RIGHT BICEPS TENODESIS;  Surgeon: Sheral Apley, MD;  Location: [facility];  Service: Orthopedics;  Laterality: Right;   CARDIAC CATHETERIZATION N/A 09/25/2015   Procedure: Left Heart Cath and Coronary Angiography;  Surgeon: [name], MD;  [facility] INVASIVE CV LAB;  Service: Cardiovascular;  Laterality: N/A;   CARDIAC CATHETERIZATION N/A 09/25/2015   Procedure: Intravascular Pressure Wire/FFR Study;  Surgeon: [name], MD;  Location: [facility] INVASIVE CV LAB;  Service: Cardiovascular;  Laterality: N/A;   CORONARY ANGIOPLASTY WITH  STENT PLACEMENT  12/15/2012; 04/28/2014; 05/03/2014   "2; 1; 1" , 4 stents 2 balloons   CORONARY STENT INTERVENTION N/A 05/01/2021   Procedure: CORONARY STENT INTERVENTION;  Surgeon: Adrian Prows, MD;  Location: [place] CV LAB;  Service: Cardiovascular;  Laterality: N/A;   FRACTIONAL FLOW RESERVE WIRE Right 03/26/2013   Procedure: FRACTIONAL FLOW RESERVE WIRE;  Surgeon: Laverda Page, MD;  Location: [facility] CATH LAB;  Service: Cardiovascular;  Laterality: Right;   FRACTIONAL FLOW RESERVE WIRE N/A 05/03/2014   Procedure: FRACTIONAL FLOW RESERVE WIRE;  Surgeon: Laverda Page, MD;  Location: [facility] CATH LAB;  Service: Cardiovascular;  Laterality: N/A;   HERNIA REPAIR  [id number]   "umbilical"    INCISION AND DRAINAGE ABSCESS Left 05/2007   "groin"    KNEE SURGERY Right 1992;  2000   "calcium deposits removed" (12/15/2012)   LEFT HEART CATH AND CORONARY ANGIOGRAPHY N/A 05/23/2017   Procedure: LEFT HEART CATH AND CORONARY ANGIOGRAPHY;  Surgeon: Adrian Prows, MD;  Location: [place] CV LAB;  Service: Cardiovascular;  Laterality: N/A;   LEFT HEART CATH AND CORONARY ANGIOGRAPHY N/A 05/01/2021   Procedure: LEFT HEART CATH AND CORONARY ANGIOGRAPHY;  Surgeon: Adrian Prows, MD;  Location: [place] CV LAB;  Service: Cardiovascular;  Laterality: N/A;   LEFT HEART CATHETERIZATION WITH  CORONARY ANGIOGRAM N/A 12/15/2012   Procedure: LEFT HEART CATHETERIZATION WITH CORONARY ANGIOGRAM;  Surgeon: Laverda Page, MD;  Location: [facility] CATH LAB;  Service: Cardiovascular;  Laterality: N/A;   LEFT HEART CATHETERIZATION WITH CORONARY ANGIOGRAM N/A 03/26/2013   Procedure: LEFT HEART CATHETERIZATION WITH CORONARY ANGIOGRAM;  Surgeon: Laverda Page, MD;  Location: [facility] CATH LAB;  Service: Cardiovascular;  Laterality: N/A;   LEFT HEART CATHETERIZATION WITH CORONARY ANGIOGRAM N/A 04/08/2014   Procedure: LEFT HEART CATHETERIZATION WITH CORONARY ANGIOGRAM;  Surgeon: Blane Ohara, MD;  Location: [facility] CATH LAB;  Service: Cardiovascular;  Laterality: N/A;   MOUTH SURGERY Right    PERCUTANEOUS CORONARY STENT INTERVENTION (PCI-S) N/A 05/03/2014   Procedure: PERCUTANEOUS CORONARY STENT INTERVENTION (PCI-S);  Surgeon: Laverda Page, MD;  Location: [facility] CATH LAB;  Service: Cardiovascular;  Laterality: N/A;   RADIOLOGY WITH ANESTHESIA N/A 02/08/2021   Procedure: MRI WITH ANESTHESIA OF ABDOMEN WITH AND WITHOUT CONSTRAST;  Surgeon: Radiologist, Medication, MD;  Location: [place];  Service: Radiology;  Laterality: N/A;   ROTATOR CUFF REPAIR     UMBILICAL HERNIA REPAIR  yrs ago   Family History  Adopted: Yes  Problem Relation Age of Onset   Emphysema Mother    Social History   Socioeconomic History   Marital status: Married    Spouse name: Pam   Number of children: 2   Years of education: 12   Highest education level: High school graduate  Occupational History   Occupation: Administrator    Comment: deliveries only now   Occupation: woodworking  Tobacco Use   Smoking status: Never   Smokeless tobacco: Never  Scientific laboratory technician Use: Never used  Substance and Sexual Activity   Alcohol use: No   Drug use: No   Sexual activity: Not Currently  Other Topics Concern   Not on file  Social History Narrative   Not on file   Social Determinants of Health   Financial Resource Strain: Low  Risk  (01/18/2021)   Overall Financial Resource Strain (CARDIA)    Difficulty of Paying Living Expenses: Not very hard  Food Insecurity: No Food Insecurity (01/18/2021)   Hunger Vital Sign  Worried About Charity fundraiser in the Last Year: Never true    [place] in the Last Year: Never true  Transportation Needs: No Transportation Needs (01/18/2021)   PRAPARE - Hydrologist (Medical): No    Lack of Transportation (Non-Medical): No  Physical Activity: Inactive (04/25/2017)   Exercise Vital Sign    Days of Exercise per Week: 0 days    Minutes of Exercise per Session: 0 min  Stress: No Stress Concern Present (04/25/2017)   [place]    Feeling of Stress : Only a little  Social Connections: Moderately Integrated (04/25/2017)   Social Connection and Isolation Panel [NHANES]    Frequency of Communication with Friends and Family: More than three times a week    Frequency of Social Gatherings with Friends and Family: More than three times a week    Attends Religious Services: More than 4 times per year    Active Member of Clubs or Organizations: No    Attends Archivist Meetings: Never    Marital Status: Married    Tobacco Counseling Counseling given: Not Answered   Clinical Intake:                 Diabetic?yes         Activities of Daily Living     No data to display          Patient Care Team: Lauree Chandler, NP as PCP - General (Geriatric Medicine) Adrian Prows, MD as Consulting Physician (Cardiology) Cameron Sprang, MD as Consulting Physician (Neurology) [place], [name], [place] (Optometry) Melida Quitter, MD as Consulting Physician (Otolaryngology)  Indicate any recent Medical Services you may have received from other than [facility] providers in the past year (date may be approximate).     Assessment:   This is a routine wellness examination for  [name].  Hearing/Vision screen Hearing Screening - Comments:: A little hearing issues due to working in a loud environment for several years  Vision Screening - Comments:: Last eye exam done last week 10/04/21 [place] [facility], Dr.Wood   Dietary issues and exercise activities discussed:     Goals Addressed   None    Depression Screen    10/11/2021    8:13 AM 09/28/2021    3:12 PM 07/12/2021    3:20 PM 01/18/2021   10:00 AM 03/06/2020   10:03 AM 03/02/2020    9:29 AM 10/28/2019    8:42 AM  PHQ 2/9 Scores  PHQ - 2 Score 0 0 0 0 0 0 0  PHQ- 9 Score  2         Fall Risk    10/11/2021    8:13 AM 10/05/2021    8:19 AM 07/12/2021   11:22 AM 07/02/2021    2:36 PM 03/23/2021   11:22 AM  [place] in the past year? 1 1 0 0 0  Number falls in past yr: 0 0 0 0 0  Injury with Fall? 1 1 0 0 0  Risk for fall due to : History of fall(s) History of fall(s) No Fall Risks No Fall Risks No Fall Risks  Follow up Falls evaluation completed Falls evaluation completed Falls evaluation completed Falls evaluation completed Falls evaluation completed    [place]:  Any stairs in or around the home? Yes  If so, are there any without handrails? Yes  Home free of loose throw rugs in walkways, pet beds, electrical cords, etc? Yes  Adequate lighting in your home to reduce risk of falls? Yes   ASSISTIVE DEVICES UTILIZED TO PREVENT FALLS:  Life alert? No  Use of a cane, walker or w/c? No  Grab bars in the bathroom? No  Shower chair or bench in shower? No  Elevated toilet seat or a handicapped toilet? No   TIMED UP AND GO:  Was the test performed? No .    Cognitive Function:    04/25/2017    9:17 AM 01/10/2016    9:05 AM  MMSE - Mini Mental State Exam  Orientation to time 5 5  Orientation to Place 5 5  Registration 3 3  Attention/ Calculation 2 5  Recall 2 3  Language- name 2 objects 2 2  Language- repeat 1 1  Language- follow 3 step command 3 3   Language- read & follow direction 1 1  Write a sentence 1 1  Copy design 1 1  Total score 26 30        10/11/2021    8:46 AM 03/02/2020    9:32 AM 03/02/2019    9:52 AM  6CIT Screen  What Year? 0 points 0 points 0 points  What month? 0 points 0 points 0 points  What time? 0 points 0 points 0 points  Count back from 20 0 points 0 points 0 points  Months in reverse 0 points 2 points 2 points  Repeat phrase 4 points 4 points 0 points  Total Score 4 points 6 points 2 points    Immunizations Immunization History  Administered Date(s) Administered   Pneumococcal Conjugate-13 12/01/2013   Td 03/04/1998, 03/01/2017   Tdap 03/06/2011, 02/28/2017    TDAP status: Up to date  Flu Vaccine status: Declined, Education has been provided regarding the importance of this vaccine but patient still declined. Advised may receive this vaccine at local pharmacy or Health Dept. Aware to provide a copy of the vaccination record if obtained from local pharmacy or Health Dept. Verbalized acceptance and understanding.  Pneumococcal vaccine status: Declined,  Education has been provided regarding the importance of this vaccine but patient still declined. Advised may receive this vaccine at local pharmacy or Health Dept. Aware to provide a copy of the vaccination record if obtained from local pharmacy or Health Dept. Verbalized acceptance and understanding.   Covid-19 vaccine status: Declined, Education has been provided regarding the importance of this vaccine but patient still declined. Advised may receive this vaccine at local pharmacy or Health Dept.or vaccine clinic. Aware to provide a copy of the vaccination record if obtained from local pharmacy or Health Dept. Verbalized acceptance and understanding.  Qualifies for Shingles Vaccine? Yes   Zostavax completed No   Shingrix Completed?: No.    Education has been provided regarding the importance of this vaccine. Patient has been advised to call  insurance company to determine out of pocket expense if they have not yet received this vaccine. Advised may also receive vaccine at local pharmacy or Health Dept. Verbalized acceptance and understanding.  Screening Tests Health Maintenance  Topic Date Due   OPHTHALMOLOGY EXAM  09/05/2021   FOOT EXAM  12/25/2021   HEMOGLOBIN A1C  04/07/2022   COLONOSCOPY (Pts 45-85yrs Insurance coverage will need to be confirmed)  10/18/2025   TETANUS/TDAP  03/02/2027   Hepatitis C Screening  Completed   HPV VACCINES  Aged Out   Pneumonia Vaccine 48+ Years old  Discontinued   INFLUENZA VACCINE  Discontinued   COVID-19 Vaccine  Discontinued   Zoster Vaccines- Shingrix  Discontinued    Health Maintenance  Health Maintenance Due  Topic Date Due   OPHTHALMOLOGY EXAM  09/05/2021    Colorectal cancer screening: Type of screening: Colonoscopy. Completed 2017. Repeat every 10 years  Lung Cancer Screening: (Low Dose CT Chest recommended if Age 49-80 years, 30 pack-year currently smoking OR have quit w/in 15years.) does not qualify.   Lung Cancer Screening Referral: na  Additional Screening:  Hepatitis C Screening: does qualify; Completed 2022   Vision Screening: Recommended annual ophthalmology exams for early detection of glaucoma and other disorders of the eye. Is the patient up to date with their annual eye exam?  Yes  Who is the provider or what is the name of the office in which the patient attends annual eye exams? [place] If pt is not established with a provider, would they like to be referred to a provider to establish care? No .   Dental Screening: Recommended annual dental exams for proper oral hygiene  Community Resource Referral / Chronic Care Management: CRR required this visit?  No   CCM required this visit?  No      Plan:     I have personally reviewed and noted the following in the patient's chart:   Medical and social history Use of alcohol, tobacco or illicit drugs   Current medications and supplements including opioid prescriptions. Patient is not currently taking opioid prescriptions. Functional ability and status Nutritional status Physical activity Advanced directives List of other physicians Hospitalizations, surgeries, and ER visits in previous 12 months Vitals Screenings to include cognitive, depression, and falls Referrals and appointments  In addition, I have reviewed and discussed with patient certain preventive protocols, quality metrics, and best practice recommendations. A written personalized care plan for preventive services as well as general preventive health recommendations were provided to patient.     Lauree Chandler, NP   10/11/2021    Virtual Visit via Telephone Note  I connected with patient 10/11/21 at  9:00 AM EDT by telephone and verified that I am speaking with the correct person using two identifiers.  Location: Patient: home Provider: [place]   I discussed the limitations, risks, security and privacy concerns of performing an evaluation and management service by telephone and the availability of in person appointments. I also discussed with the patient that there may be a patient responsible charge related to this service. The patient expressed understanding and agreed to proceed.   I discussed the assessment and treatment plan with the patient. The patient was provided an opportunity to ask questions and all were answered. The patient agreed with the plan and demonstrated an understanding of the instructions.   The patient was advised to call back or seek an in-person evaluation if the symptoms worsen or if the condition fails to improve as anticipated.  I provided 14 minutes of non-face-to-face time during this encounter.  Carlos American. Harle Battiest Avs printed and mailed

## 2021-10-11 NOTE — Patient Instructions (Signed)
[name] , Thank you for taking time to come for your Medicare Wellness Visit. I appreciate your ongoing commitment to your health goals. Please review the following plan we discussed and let me know if I can assist you in the future.   Screening recommendations/referrals: Colonoscopy up to date Recommended yearly ophthalmology/optometry visit for glaucoma screening and checkup Recommended yearly dental visit for hygiene and checkup  Vaccinations: Influenza vaccine due annually in September/October Pneumococcal vaccine -you have declined Tdap vaccine up to date Shingles vaccine you have declined    Advanced directives: recommend to complete and bring to office to place on file.   Conditions/risks identified: advance age, diabetes, heart disease  Next appointment: yearly for awv  Preventive Care 20 Years and Older, Male Preventive care refers to lifestyle choices and visits with your health care provider that can promote health and wellness. What does preventive care include? A yearly physical exam. This is also called an annual well check. Dental exams once or twice a year. Routine eye exams. Ask your health care provider how often you should have your eyes checked. Personal lifestyle choices, including: Daily care of your teeth and gums. Regular physical activity. Eating a healthy diet. Avoiding tobacco and drug use. Limiting alcohol use. Practicing safe sex. Taking low doses of aspirin every day. Taking vitamin and mineral supplements as recommended by your health care provider. What happens during an annual well check? The services and screenings done by your health care provider during your annual well check will depend on your age, overall health, lifestyle risk factors, and family history of disease. Counseling  Your health care provider may ask you questions about your: Alcohol use. Tobacco use. Drug use. Emotional well-being. Home and relationship  well-being. Sexual activity. Eating habits. History of falls. Memory and ability to understand (cognition). Work and work Astronomer. Screening  You may have the following tests or measurements: Height, weight, and BMI. Blood pressure. Lipid and cholesterol levels. These may be checked every 5 years, or more frequently if you are over 34 years old. Skin check. Lung cancer screening. You may have this screening every year starting at age 109 if you have a 30-pack-year history of smoking and currently smoke or have quit within the past 15 years. Fecal occult blood test (FOBT) of the stool. You may have this test every year starting at age 21. Flexible sigmoidoscopy or colonoscopy. You may have a sigmoidoscopy every 5 years or a colonoscopy every 10 years starting at age 54. Prostate cancer screening. Recommendations will vary depending on your family history and other risks. Hepatitis C blood test. Hepatitis B blood test. Sexually transmitted disease (STD) testing. Diabetes screening. This is done by checking your blood sugar (glucose) after you have not eaten for a while (fasting). You may have this done every 1-3 years. Abdominal aortic aneurysm (AAA) screening. You may need this if you are a current or former smoker. Osteoporosis. You may be screened starting at age 61 if you are at high risk. Talk with your health care provider about your test results, treatment options, and if necessary, the need for more tests. Vaccines  Your health care provider may recommend certain vaccines, such as: Influenza vaccine. This is recommended every year. Tetanus, diphtheria, and acellular pertussis (Tdap, Td) vaccine. You may need a Td booster every 10 years. Zoster vaccine. You may need this after age 25. Pneumococcal 13-valent conjugate (PCV13) vaccine. One dose is recommended after age 40. Pneumococcal polysaccharide (PPSV23) vaccine. One dose is  recommended after age 60. Talk to your health care  provider about which screenings and vaccines you need and how often you need them. This information is not intended to replace advice given to you by your health care provider. Make sure you discuss any questions you have with your health care provider. Document Released: 03/17/2015 Document Revised: 11/08/2015 Document Reviewed: 12/20/2014 Elsevier Interactive Patient Education  2017 [place] Prevention in the Home Falls can cause injuries. They can happen to people of all ages. There are many things you can do to make your home safe and to help prevent falls. What can I do on the outside of my home? Regularly fix the edges of walkways and driveways and fix any cracks. Remove anything that might make you trip as you walk through a door, such as a raised step or threshold. Trim any bushes or trees on the path to your home. Use bright outdoor lighting. Clear any walking paths of anything that might make someone trip, such as rocks or tools. Regularly check to see if handrails are loose or broken. Make sure that both sides of any steps have handrails. Any raised decks and porches should have guardrails on the edges. Have any leaves, snow, or ice cleared regularly. Use sand or salt on walking paths during winter. Clean up any spills in your garage right away. This includes oil or grease spills. What can I do in the bathroom? Use night lights. Install grab bars by the toilet and in the tub and shower. Do not use towel bars as grab bars. Use non-skid mats or decals in the tub or shower. If you need to sit down in the shower, use a plastic, non-slip stool. Keep the floor dry. Clean up any water that spills on the floor as soon as it happens. Remove soap buildup in the tub or shower regularly. Attach bath mats securely with double-sided non-slip rug tape. Do not have throw rugs and other things on the floor that can make you trip. What can I do in the bedroom? Use night lights. Make  sure that you have a light by your bed that is easy to reach. Do not use any sheets or blankets that are too big for your bed. They should not hang down onto the floor. Have a firm chair that has side arms. You can use this for support while you get dressed. Do not have throw rugs and other things on the floor that can make you trip. What can I do in the kitchen? Clean up any spills right away. Avoid walking on wet floors. Keep items that you use a lot in easy-to-reach places. If you need to reach something above you, use a strong step stool that has a grab bar. Keep electrical cords out of the way. Do not use floor polish or wax that makes floors slippery. If you must use wax, use non-skid floor wax. Do not have throw rugs and other things on the floor that can make you trip. What can I do with my stairs? Do not leave any items on the stairs. Make sure that there are handrails on both sides of the stairs and use them. Fix handrails that are broken or loose. Make sure that handrails are as long as the stairways. Check any carpeting to make sure that it is firmly attached to the stairs. Fix any carpet that is loose or worn. Avoid having throw rugs at the top or bottom of the stairs. If  you do have throw rugs, attach them to the floor with carpet tape. Make sure that you have a light switch at the top of the stairs and the bottom of the stairs. If you do not have them, ask someone to add them for you. What else can I do to help prevent falls? Wear shoes that: Do not have high heels. Have rubber bottoms. Are comfortable and fit you well. Are closed at the toe. Do not wear sandals. If you use a stepladder: Make sure that it is fully opened. Do not climb a closed stepladder. Make sure that both sides of the stepladder are locked into place. Ask someone to hold it for you, if possible. Clearly mark and make sure that you can see: Any grab bars or handrails. First and last steps. Where the  edge of each step is. Use tools that help you move around (mobility aids) if they are needed. These include: Canes. Walkers. Scooters. Crutches. Turn on the lights when you go into a dark area. Replace any light bulbs as soon as they burn out. Set up your furniture so you have a clear path. Avoid moving your furniture around. If any of your floors are uneven, fix them. If there are any pets around you, be aware of where they are. Review your medicines with your doctor. Some medicines can make you feel dizzy. This can increase your chance of falling. Ask your doctor what other things that you can do to help prevent falls. This information is not intended to replace advice given to you by your health care provider. Make sure you discuss any questions you have with your health care provider. Document Released: 12/15/2008 Document Revised: 07/27/2015 Document Reviewed: 03/25/2014 Elsevier Interactive Patient Education  2017 [employer].

## 2021-10-11 NOTE — Progress Notes (Signed)
   This service is provided via telemedicine  No vital signs collected/recorded due to the encounter was a telemedicine visit.   Location of patient (ex: home, work):  Home  Patient consents to a telephone visit: Yes, see telephone visit dated 10/11/21  Location of the provider (ex: office, home):  [facility], Remote Location  Name of any referring provider:  N/A  Names of all persons participating in the telemedicine service and their role in the encounter:  S.Chrae B/CMA, Jessica Eubanks, NP, and Patient   Time spent on call:  9 min with medical assistant   

## 2021-10-15 ENCOUNTER — Other Ambulatory Visit: Payer: Self-pay | Admitting: Nurse Practitioner

## 2021-10-20 NOTE — Progress Notes (Unsigned)
Initial neurology clinic note  Kenneth Lukacs Sr. MRN: [id number] DOB: [birth year]  Referring provider: [name], NP  Primary care provider: [name], NP  Reason for consult:  restless legs syndrome  Subjective:  This is [name], a 71 y.o. right-handed male with a medical history of arthritis, GERD, COPD, CAD, HTN, HLD, DM2, iron deficiency anemia, TIA (01/14/21 - left face droop and blurry vision) who presents to neurology clinic with restless leg syndrome. The patient is alone today.  Patient started having restless legs syndrome. He describes needing to move his legs, then would improve after moving. It would happen worse at night and while in bed. He went untreated for many years. After his first stent in 2014, patient was put on Lipitor. He had such severe shaking, he required hospitalization and narcotics to stop his restless legs. He mentions having problems with his iron. He has taken iron supplementation and iron shots. He is not currently on any iron supplementation.  He has taken many different medications in the past, but is currently on gabapentin 200 mg at bedtime and pramipexole  at 5 pm,  at 7 pm, and  at bedtime (9-10 pm). Previously he did not take pramipexole later, but now requires it earlier and requires more medication. Symptoms continue to worsen though. He cannot sit down long or symptoms will get severe. He feels like he has to move to keep the symptoms at bay. His symptoms have spread throughout the body now.  Patient was previously seen for restless leg syndrome (in 2015) by Dr. Karel Jarvis. To briefly review, per history from clinic note on 09/29/13: "was admitted at [facility] last 09/18/2013 for leg pain due to severe RLS, found to have rhabdomyolysis with CK of 4907.  He was admitted for hydration and pain management.  He has had RLS for at least 20 years, initially affecting his legs, however since 2004 his arms have been involved as well.   He has been taking Mirapex for at least 10 years, and had been takin 0.75mg  dose 1-2 tablets at night.  This dose was causing him drowsiness.  He saw his PCP on 07/14 due to worsening RLS symptoms. He was tried on Zanaflex and Mirapex was discontinued, which did not help.  He restarted Mirapex and had to take additional doses due to constant limb movements and pain, prompting him to go to the ER.  He was started on Gabapentin and clonazepam which did not help much, he was also given Dilaudid which quieted him down.  He was discharged home then returned to the ER due to persistently restless and painful legs bilaterally. He was described to be literally thrashing about the bed, able to answer questions but in significant distress. He tells me the symptoms would start in his right leg, leg would flex at the hip, then he stretches and leans back because the leg is jerking so violently for up to 1-1/2 hours. This eases off then starts on his left leg.  Walking and moving around helps.  Symptoms improved with pain medication. He saw his PCP a week ago and reported leg swelling, ?due to gabapentin. He was started on Sinemet 10/100mg  TID, which her reports has helped with the RLS symptoms, however wears off in the middle of the night. He would take 1/2 tab Mirapex when Sinemet wears off.  Since starting Sinemet, he reports weird dreams but no other side effects.  He had discontinued gabapentin, clonazepam, and Zanaflex.  He has infrequent headaches, dyspnea on exertion (climbing steps), occasional choking. Otherwise he denies any diplopia, dysarthria, focal numbness/tingling, bowel/bladder dysfunction.  He has a history of C1 fracture at age 39 or 54. No known family history of similar RLS because he was adopted."  Patient has previously tried Mirapex and Sinemet as well as gabapentin, clonazepam, zanaflex, and Tramadol.  He endorses occasional discomfort in his legs and swelling of his legs.  Patient endorses  shortness of breath. He feels it more with exertion but occasionally when laying flat. He thinks it may be related to cardiac problems. He will be seeing pulmonology next month.  He denies oculobulbar symptoms.  He denies fevers, chills, or unexplained weight lost.  EtOH: none  Dietary restrictions: none Family hx of neurologic disorders: patient is adopted and not sure  MEDICATIONS:  Outpatient Encounter Medications as of 10/22/2021  Medication Sig   acetaminophen (TYLENOL) 500 MG tablet Take 500 mg by mouth every 6 (six) hours as needed for mild pain, moderate pain, headache or fever.   albuterol (VENTOLIN HFA) 108 (90 Base) MCG/ACT inhaler TAKE 2 PUFFS BY MOUTH EVERY 6 HOURS AS NEEDED FOR WHEEZE OR SHORTNESS OF BREATH   aspirin EC 81 MG tablet Take 1 tablet (81 mg total) by mouth daily. Swallow whole.   BD PEN NEEDLE NANO 2ND GEN 32G X 4 MM MISC USE AS DIRECTED   clopidogrel (PLAVIX) 75 MG tablet Take 1 tablet (75 mg total) by mouth daily.   Continuous Blood Gluc Receiver (FREESTYLE LIBRE 14 DAY READER) DEVI Inject 1 Device as directed daily as needed. Use once daily as direct to check blood sugar E11.51   Continuous Blood Gluc Sensor (FREESTYLE LIBRE 14 DAY SENSOR) MISC USE TO TEST BLOOD SUGAR THREE TIMES DAILY. E11.51. E11.59   fluticasone (FLONASE) 50 MCG/ACT nasal spray Place 1 spray into both nostrils daily as needed for allergies or rhinitis.   gabapentin (NEURONTIN) 100 MG capsule TAKE 2 CAPSULES BY MOUTH AT BEDTIME   glucose 4 GM chewable tablet Chew 1 tablet (4 g total) by mouth as needed for low blood sugar.   insulin aspart (NOVOLOG FLEXPEN) 100 UNIT/ML FlexPen Inject 25-30 Units into the skin See admin instructions. Inject 30 units subcutaneously in the morning, inject 25 units subcutaneously with lunch & inject 25 units subcutaneously with dinner.   Insulin Glargine (BASAGLAR KWIKPEN) 100 UNIT/ML Inject 30 Units into the skin at bedtime.   isosorbide mononitrate (IMDUR) 60 MG  24 hr tablet TAKE 1 TABLET BY MOUTH EVERY DAY   LIFESCAN FINEPOINT LANCETS MISC Use to test for blood sugar three times daily dx: E11.51   metoprolol succinate (TOPROL-XL) 25 MG 24 hr tablet Take 50 mg by mouth daily.   nitroGLYCERIN (NITROSTAT) 0.4 MG SL tablet PLACE 1 TABLET UNDER THE TONGUE EVERY 5 MINUTES AS NEEDED FOR CHEST PAIN.   ONETOUCH ULTRA test strip USE TO TEST FOR BLOOD SUGAR THREE TIMES DAILY DX: E11.51   pantoprazole (PROTONIX) 40 MG tablet TAKE ONE TABLET BY MOUTH ONCE DAILY FOR STOMACH   PRALUENT 150 MG/ML SOAJ INJECT 1 PEN INTO THE SKIN EVERY 14 (FOURTEEN) DAYS.   pramipexole (MIRAPEX) 1 MG tablet TAKE 1 TABLET BY MOUTH AT 5PM, 1 TABLET BY MOUTH AT 7PM, AND TAKE 2 TABLETS BY MOUTH AT BEDTIME.   valsartan (DIOVAN) 80 MG tablet Take 80 mg by mouth every evening.   Facility-Administered Encounter Medications as of 10/22/2021  Medication   [COMPLETED] hydrALAZINE (APRESOLINE) injection 10 mg  PAST MEDICAL HISTORY: Past Medical History:  Diagnosis Date   Arthritis    "left thumb" (05/23/2017) right shoulder   Barrett's esophagus    "we've been told that it's all gone; still take RX for GERD" (05/23/2017)   Bilateral swelling of feet and ankles x 3 weeks as of 12-01-2019   CAD (coronary artery disease), native coronary artery    Coronary angiogram 05/03/2014:  Proximal LAD 3.0x12 mm Promus premier stent.  04/08/2014: Mid Cx 3.5 x 16 mm Promus DES.  03/29/2013: Mid LAD 2.75 x 38 mm Promus Premier drug-eluting stent, balloon angioplasty of D1 and distal LAD and stenting of distal RCA with 2.75 x 24 mm promos Premier drug-eluting stent 12/15/2012 widely patent.   Cervicalgia    Chronic bronchitis (HCC)    "q yr" (05/23/2017)   Complication of anesthesia    " I do not wake up very well "  slow to wake up   COVID-19 05/2019   all covid symptoms in hospital x 5 days, all symptoms resolved took monoclonal antibody tx    Dyspnea    with exertion   Family history of adverse reaction to  anesthesia    "son w/PONV"   GERD (gastroesophageal reflux disease)    Heart murmur    "grew out of it"    Hyperlipidemia    Hypertension    Hypoglycemia, unspecified    IDDM (insulin dependent diabetes mellitus)    type 2   Impotence of organic origin    Iron deficiency anemia    Memory loss    mild   Other malaise and fatigue    Pneumonia 05/2019   PONV (postoperative nausea and vomiting)    after wisdom teeth pulled, no problems with other surgeries   Restless leg    Rotator cuff arthropathy    Stroke (HCC)    Tension headache    "sometimes" (05/23/2017)   Unspecified gastritis and gastroduodenitis with hemorrhage    Unstable angina pectoris (HCC) 04/08/2014   Coronary angiogram 05/03/2014:  Proximal LAD 3.0x12 mm Promus premier stent.  04/08/2014: Mid Cx 3.5 x 16 mm Promus DES.  03/29/2013: Mid LAD 2.75 x 38 mm Promus Premier drug-eluting stent, balloon angioplasty of D1 and distal LAD and stenting of distal RCA with 2.75 x 24 mm promos Premier drug-eluting stent 12/15/2012 widely patent.    PAST SURGICAL HISTORY: Past Surgical History:  Procedure Laterality Date   BICEPT TENODESIS Right 12/07/2019   Procedure: RIGHT BICEPS TENODESIS;  Surgeon: Sheral ApleyMurphy, Timothy D, MD;  [facility];  Service: Orthopedics;  Laterality: Right;   CARDIAC CATHETERIZATION N/A 09/25/2015   Procedure: Left Heart Cath and Coronary Angiography;  Surgeon: Yates DecampJay Ganji, MD;  [facility];  Service: Cardiovascular;  Laterality: N/A;   CARDIAC CATHETERIZATION N/A 09/25/2015   Procedure: Intravascular Pressure Wire/FFR Study;  Surgeon: Yates DecampJay Ganji, MD;  [facility];  Service: Cardiovascular;  Laterality: N/A;   CORONARY ANGIOPLASTY WITH STENT PLACEMENT  12/15/2012; 04/28/2014; 05/03/2014   "2; 1; 1" , 4 stents 2 balloons   CORONARY STENT INTERVENTION N/A 05/01/2021   Procedure: CORONARY STENT INTERVENTION;  Surgeon: Yates DecampGanji, Jay, MD;  Location: [facility] INVASIVE CV LAB;   Service: Cardiovascular;  Laterality: N/A;   FRACTIONAL FLOW RESERVE WIRE Right 03/26/2013   Procedure: FRACTIONAL FLOW RESERVE WIRE;  Surgeon: Pamella PertJagadeesh R Ganji, MD;  [facility];  Service: Cardiovascular;  Laterality: Right;   FRACTIONAL FLOW RESERVE WIRE N/A 05/03/2014   Procedure:  FRACTIONAL FLOW RESERVE WIRE;  Surgeon: Pamella Pert, MD;  Location[facility] C[facility] LAB;  Service: Cardiovascular;  Laterality: N/A;   HERNIA REPAIR  2010   "umbilical"    INCISION AND DRAINAGE ABSCESS Left 05/2007   "groin"    KNEE SURGERY Right 1992;  2000   "calcium deposits removed" (12/15/2012)   LEFT HEART CATH AND CORONARY ANGIOGRAPHY N/A 05/23/2017   Procedure: LEFT HEART CATH AND CORONARY ANGIOGRAPHY;  Surgeon: [name], MD;  Location: [facility] INVAS[facility] CV LAB;  Service: Cardiovascular;  Laterality: N/A;   LEFT HEART CATH AND CORONARY ANGIOGRAPHY N/A 05/01/2021   Procedure: LEFT HEART CATH AND CORONARY ANGIOGRAPHY;  Surgeon: [name], MD;  Location: [facility] INVAS[facility] CV LAB;  Service: Cardiovascular;  Laterality: N/A;   LEFT HEART CATHETERIZATION WITH CORONARY ANGIOGRAM N/A 12/15/2012   Procedure: LEFT HEART CATHETERIZATION WITH CORONARY ANGIOGRAM;  Surgeon: Pamella Pert, MD;  Location: [facility] CATH LAB;  Service: Cardiovascular;  Laterality: N/A;   LEFT HEART CATHETERIZATION WITH CORONARY ANGIOGRAM N/A 03/26/2013   Procedure: LEFT HEART CATHETERIZATION WITH CORONARY ANGIOGRAM;  Surgeon: Pamella Pert, MD;  Location: [facility] CATH LAB;  Service: Cardiovascular;  Laterality: N/A;   LEFT HEART CATHETERIZATION WITH CORONARY ANGIOGRAM N/A 04/08/2014   Procedure: LEFT HEART CATHETERIZATION WITH CORONARY ANGIOGRAM;  Surgeon: Micheline Chapman, MD;  Location: [facility] CATH LAB;  Service: Cardiovascular;  Laterality: N/A;   MOUTH SURGERY Right    PERCUTANEOUS CORONARY STENT INTERVENTION (PCI-S) N/A 05/03/2014   Procedure: PERCUTANEOUS CORONARY STENT INTERVENTION (PCI-S);  Surgeon: Pamella Pert, MD;  Location[facility]  CATH LAB;  Service: Cardiovascular;  Laterality: N/A;   RADIOLOGY WITH ANESTHESIA N/A 02/08/2021   Procedure: MRI WITH ANESTHESIA OF ABDOMEN WITH AND WITHOUT CONSTRAST;  Surgeon: Radiologist, Medication, MD;  Location: [facility] OR;  Service: Radiology;  Laterality: N/A;   ROTATOR CUFF REPAIR     UMBILICAL HERNIA REPAIR  yrs ago    ALLERGIES: Allergies  Allergen Reactions   Amlodipine Other (See Comments)    Worsening restless legs, dyspnea and leg swelling   Lyrica [Pregabalin] Other (See Comments)    Made patient very lethargic the next morning, very hard to patient to function, move, etc.    Statins Other (See Comments)    Causes Restless Legs, rhabdomyolysis   Ativan [Lorazepam] Other (See Comments)    Markedly increased restless legs   Trulicity [Dulaglutide] Other (See Comments)    GI side effects     FAMILY HISTORY: Family History  Adopted: Yes  Problem Relation Age of Onset   Emphysema Mother     SOCIAL HISTORY: Social History   Tobacco Use   Smoking status: Never   Smokeless tobacco: Never  Vaping Use   Vaping Use: Never used  Substance Use Topics   Alcohol use: No   Drug use: No   Social History   Social History Narrative   Right handed    Caffeine 2 cups daily   Winter time 4 cups daily   Lives family one level home    Semi Retired    Objective:  Vital Signs:  BP 128/75   Pulse 69   Ht 5\' 8"  (1.727 m)   Wt 206 lb (93.4 kg)   SpO2 94%   BMI 31.32 kg/m   General: No acute distress.  Patient appears well-groomed.   Head:  Normocephalic/atraumatic Neck: supple, no paraspinal tenderness, full range of motion Back: No paraspinal tenderness Heart: regular rate and rhythm Lungs: Clear to auscultation Vascular: No carotid bruits.  Neurological Exam: Mental status: alert and oriented to person, place, and time, speech fluent and not dysarthric, language intact.  Cranial nerves: CN I: not tested CN II: pupils equal, round and reactive to light CN  III, IV, VI:  full range of motion, no nystagmus, no ptosis CN V: facial sensation intact. CN VII: upper and lower face symmetric CN VIII: hearing intact CN IX, X: gag intact, uvula midline CN XI: sternocleidomastoid and trapezius muscles intact CN XII: tongue midline  Bulk & Tone: normal, no fasciculations. Motor:  muscle strength 5/5 throughout Deep Tendon Reflexes:  1+ throughout, except at ankles that is bilaterally absent. toes downgoing.   Sensation:  Pinprick, proprioception, and vibratory sensation intact. Finger to nose testing:  Without dysmetria.   Gait:  Normal station and stride.  Romberg negative.   Labs and Imaging review: Out-side paper records, electronic medical record, and images have been reviewed where available and summarized as:  Ferritin (03/06/20): 28  Iron (12/26/16): low at 37 TIBC: 376 Ferritin: 11  Mg (09/03/21): 2.6  Lab Results  Component Value Date   HGBA1C 8.1 (H) 10/05/2021   Lab Results  Component Value Date   VITAMINB12 236 01/15/2021   Lab Results  Component Value Date   TSH 2.684 01/15/2021   Lab Results  Component Value Date   ESRSEDRATE 20 09/22/2013   EMG (03/23/14): Normal study of RUE and RLE (no myopathy, radiculopathy, or peripheral neuropathy).  MRI brain, MRA head and neck (01/14/21): IMPRESSION: MRI HEAD:   Normal brain MRI for age. No acute intracranial infarct or other abnormality identified.   MRA HEAD:   Normal intracranial MRA. No large vessel occlusion or hemodynamically significant stenosis. No aneurysm.   MRA NECK:   1. 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 The distal right V4 segment is patent on corresponding MRA head portion of this exam with perfusion of the right PICA. 2. Widely patent dominant left vertebral artery. 3. Wide patency of both carotid artery systems within the neck.    Assessment/Plan:  Kenneth Sopp Sr. is a 71 y.o. male  who presents for evaluation of restless leg syndrome.He has a relevant medical history of restless leg syndrome, iron deficiency, arthritis, COPD, CAD, HTN, HLD, and DM2. Patient's history is consistent with the long standing diagnosis of restless leg syndrome. The worsening symptoms in the setting of pramipexole use, requiring more and earlier doses is concerning for augmentation. Patient is not currently on iron supplementation, so iron deficiency may also be contributing.  PLAN: -Blood work: iron, TIBC, ferritin, CBC, CMP, vit D, B12 -Increase gabapentin to 300 mg at bedtime -Decrease pramipexole:  -Starting today: 1mg  at 5pm, 1mg  at 7pm, and 1mg  at bedtime for one week then,  -Decrease to: 1 mg at 5pm, 1mg  at bedtime -We will touch base after labs return and this medication adjustment and consider further treatment changes.  -Return to clinic in 3 months  The impression above as well as the plan as outlined below were extensively discussed with the patient who voiced understanding. All questions were answered to their satisfaction.  When available, results of the above investigations and possible further recommendations will be communicated to the patient via telephone/MyChart. Patient to call office if not contacted after expected testing turnaround time.   Total time spent reviewing records, interview, history/exam, documentation, and coordination of care on day of encounter:  50 min   Thank you for allowing me to participate in  patient's care.  If I can answer any additional questions, I would be pleased to do so.  Jacquelyne Balint, MD   CC: Janyth Contes Janene Harvey, NP [address] [place] [address]  CC: Referring provider: [name], NP [address] [place],  [place] [address]

## 2021-10-22 ENCOUNTER — Other Ambulatory Visit (INDEPENDENT_AMBULATORY_CARE_PROVIDER_SITE_OTHER): Payer: Medicare Other

## 2021-10-22 ENCOUNTER — Encounter: Payer: Self-pay | Admitting: Neurology

## 2021-10-22 ENCOUNTER — Ambulatory Visit (INDEPENDENT_AMBULATORY_CARE_PROVIDER_SITE_OTHER): Payer: Medicare Other | Admitting: Neurology

## 2021-10-22 VITALS — BP 128/75 | HR 69 | Ht 68.0 in | Wt 206.0 lb

## 2021-10-22 DIAGNOSIS — E559 Vitamin D deficiency, unspecified: Secondary | ICD-10-CM | POA: Diagnosis not present

## 2021-10-22 DIAGNOSIS — G2581 Restless legs syndrome: Secondary | ICD-10-CM

## 2021-10-22 DIAGNOSIS — E611 Iron deficiency: Secondary | ICD-10-CM | POA: Diagnosis not present

## 2021-10-22 LAB — HM DIABETES FOOT EXAM

## 2021-10-22 MED ORDER — GABAPENTIN 100 MG PO CAPS: 300.0000 mg | ORAL_CAPSULE | Freq: Every day | ORAL | 1 refills | Status: DC

## 2021-10-22 MED ORDER — PRAMIPEXOLE DIHYDROCHLORIDE 1 MG PO TABS: ORAL_TABLET | ORAL | 1 refills | Status: DC

## 2021-10-22 NOTE — Patient Instructions (Addendum)
I saw you today for restless leg syndrome. I believe your worsening symptoms are due to your dopamine medication, pramipexole. This is called augmentation.  I want to check some labs today to make sure there is nothing else contributing to your symptoms, including low iron.  -Increase gabapentin to 300 mg at bedtime -Decrease pramipexole:  -Starting today: 1mg  at 5pm, 1mg  at 7pm, and 1mg  at bedtime for one week then,  -Decrease to: 1 mg at 5pm, 1mg  at bedtime  We will touch base after labs return and this medication adjustment and consider further treatment changes.  Recommend compression stockings for leg swelling.  Return to clinic in 3 months.  , MD

## 2021-10-23 ENCOUNTER — Telehealth: Payer: Self-pay | Admitting: Neurology

## 2021-10-23 LAB — COMPREHENSIVE METABOLIC PANEL
ALT: 30 U/L (ref 0–53)
AST: 24 U/L (ref 0–37)
Albumin: 4.1 g/dL (ref 3.5–5.2)
Alkaline Phosphatase: 54 U/L (ref 39–117)
BUN: 28 mg/dL — ABNORMAL HIGH (ref 6–23)
CO2: 26 mEq/L (ref 19–32)
Calcium: 9.2 mg/dL (ref 8.4–10.5)
Chloride: 101 mEq/L (ref 96–112)
Creatinine, Ser: 1.38 mg/dL (ref 0.40–1.50)
GFR: 51.21 mL/min — ABNORMAL LOW (ref >60.00)
Glucose, Bld: 193 mg/dL — ABNORMAL HIGH (ref 70–99)
Potassium: 4.8 mEq/L (ref 3.5–5.1)
Sodium: 136 mEq/L (ref 135–145)
Total Bilirubin: 0.6 mg/dL (ref 0.2–1.2)
Total Protein: 6.6 g/dL (ref 6.0–8.3)

## 2021-10-23 LAB — IRON,TIBC AND FERRITIN PANEL
%SAT: 22 % (calc) (ref 20–48)
Ferritin: 21 ng/mL — ABNORMAL LOW (ref 24–380)
Iron: 72 ug/dL (ref 50–180)
TIBC: 330 mcg/dL (calc) (ref 250–425)

## 2021-10-23 LAB — CBC
HCT: 40.3 % (ref 39.0–52.0)
Hemoglobin: 13.3 g/dL (ref 13.0–17.0)
MCHC: 32.9 g/dL (ref 30.0–36.0)
MCV: 85.6 fl (ref 78.0–100.0)
Platelets: 110 10*3/uL — ABNORMAL LOW (ref 150.0–400.0)
RBC: 4.71 Mil/uL (ref 4.22–5.81)
RDW: 14.3 % (ref 11.5–15.5)
WBC: 6 10*3/uL (ref 4.0–10.5)

## 2021-10-23 LAB — VITAMIN D 25 HYDROXY (VIT D DEFICIENCY, FRACTURES): VITD: 33.31 ng/mL (ref 30.00–100.00)

## 2021-10-23 LAB — VITAMIN B12: Vitamin B-12: 248 pg/mL (ref 211–911)

## 2021-10-23 NOTE — Telephone Encounter (Signed)
Called patient to discuss the results of his iron studies. In brief, his ferritin was low at 21. I explained that this is contributing to his symptoms and that we needed to supplement his iron.  Updated plan: Pt has low ferritin/iron deficiency causing RLS symptoms.   -we will add ferrous sulfate 325 mg daily x 3 months.  Discussed not to take with milk.  Discussed that this can cause constipation and to drink plenty of water.   -Patient will take the ferrous sulfate with small glass of orange juice (if not diabetic) -will recheck ferritin in the future   -Will continue gabapentin 300 mg qhs as discussed on 10/22/21 -Will continue pramipexole taper as discussed.  All questions were answered.  Jacquelyne Balint, MD

## 2021-10-29 ENCOUNTER — Telehealth: Payer: Self-pay | Admitting: Neurology

## 2021-10-29 ENCOUNTER — Ambulatory Visit (INDEPENDENT_AMBULATORY_CARE_PROVIDER_SITE_OTHER): Payer: Medicare Other | Admitting: Internal Medicine

## 2021-10-29 ENCOUNTER — Encounter: Payer: Self-pay | Admitting: Internal Medicine

## 2021-10-29 VITALS — BP 140/82 | HR 59 | Ht 68.0 in | Wt 207.4 lb

## 2021-10-29 DIAGNOSIS — E785 Hyperlipidemia, unspecified: Secondary | ICD-10-CM | POA: Diagnosis not present

## 2021-10-29 DIAGNOSIS — I209 Angina pectoris, unspecified: Secondary | ICD-10-CM | POA: Diagnosis not present

## 2021-10-29 DIAGNOSIS — E1159 Type 2 diabetes mellitus with other circulatory complications: Secondary | ICD-10-CM

## 2021-10-29 DIAGNOSIS — E1165 Type 2 diabetes mellitus with hyperglycemia: Secondary | ICD-10-CM | POA: Diagnosis not present

## 2021-10-29 MED ORDER — SEMAGLUTIDE(0.25 OR 0.5MG/DOS) 2 MG/3ML ~~LOC~~ SOPN: 0.5000 [IU] | PEN_INJECTOR | SUBCUTANEOUS | 3 refills | Status: DC

## 2021-10-29 NOTE — Telephone Encounter (Signed)
Called patient regarding B12 result. I explained that while his B12 level is technically normal, it is on the low end of normal. Given his symptoms, I recommended beginning B12 supplementation with 1000 mcg per day. Patient understood and indicated he would do so.  All questions were answered.  Jacquelyne Balint, MD

## 2021-10-29 NOTE — Progress Notes (Signed)
Patient ID: Kenneth Strole Sr., male   DOB: Jun 11, 1950, 71 y.o.   MRN: [id number]  HPI: Kenneth Carbajal Sr. is a 71 y.o.-year-old male, returning for follow-up for DM2, dx in 2016, insulin-dependent  since 2020, uncontrolled, with complications (CAD, history of CVA, CKD stage III). Pt. previously saw Dr. Loanne Drilling, last visit 5 months ago.  Since last OV, he met with a nutritionist >> changed diet. He needs less insulin since then.  Reviewed HbA1c: Lab Results  Component Value Date   HGBA1C 8.1 (H) 10/05/2021   HGBA1C 8.3 (A) 05/07/2021   HGBA1C 7.9 (H) 01/15/2021   HGBA1C 8.1 (A) 12/25/2020   HGBA1C 8.6 (A) 09/14/2020   HGBA1C 8.7 (H) 06/30/2020   HGBA1C 8.6 (H) 02/22/2020   HGBA1C 8.8 (H) 10/18/2019   HGBA1C 7.6 (H) 06/14/2019   HGBA1C 9.2 (H) 03/09/2019   Pt is on a regimen of: - Basaglar 30 units at bedtime - Novolog 30-25-25 units (3x a day), immediately before meals >>  10-20 units before B 15 units before L 15-20 unit before D He had nausea with metformin - stopped 2 mo ago. He tried Trulicity >> nausea.  Pt checks his sugars >4 a day with his CGM:  Lowest sugar was 46; he has hypoglycemia awareness at 70.  Highest sugar was 400.  Glucometer: One Touch ultra  Pt's meals are: - Breakfast: - Lunch: - Dinner: - Snacks:  - + CKD, last BUN/creatinine:  Lab Results  Component Value Date   BUN 28 (H) 10/22/2021   BUN 19 10/05/2021   CREATININE 1.38 10/22/2021   CREATININE 1.23 10/05/2021  On Diovan 80 mg daily.  -+HL; last set of lipids: Lab Results  Component Value Date   CHOL 81 01/15/2021   HDL 31 (L) 01/15/2021   LDLCALC 15 01/15/2021   LDLDIRECT 110 (H) 06/17/2019   TRIG 174 (H) 01/15/2021   CHOLHDL 2.6 01/15/2021  He is on Praluent.  He had side effects from statins in the past.  - last eye exam was in 05/21/2021. No DR.   - +  restless leg syndrome.  He is on Neurontin. Also, on pramipexole for restless leg syndrome.  He sees neurology.  His dose was recently  decreased, since he was taking too much.  He also has a history of HTN, GERD, Barrett's esophagus, iron deficiency anemia, memory loss.  ROS: + see HPI No increased urination, blurry vision, nausea, chest pain.  Past Medical History:  Diagnosis Date   Arthritis    "left thumb" (05/23/2017) right shoulder   Barrett's esophagus    "we've been told that it's all gone; still take RX for GERD" (05/23/2017)   Bilateral swelling of feet and ankles x 3 weeks as of 12-01-2019   CAD (coronary artery disease), native coronary artery    Coronary angiogram 05/03/2014:  Proximal LAD 3.0x12 mm Promus premier stent.  04/08/2014: Mid Cx 3.5 x 16 mm Promus DES.  03/29/2013: Mid LAD 2.75 x 38 mm Promus Premier drug-eluting stent, balloon angioplasty of D1 and distal LAD and stenting of distal RCA with 2.75 x 24 mm promos Premier drug-eluting stent 12/15/2012 widely patent.   Cervicalgia    Chronic bronchitis ([place])    "q yr" (9/41/7408)   Complication of anesthesia    " I do not wake up very well "  slow to wake up   COVID-19 05/2019   all covid symptoms in hospital x 5 days, all symptoms resolved took monoclonal antibody tx  Dyspnea    with exertion   Family history of adverse reaction to anesthesia    "son w/PONV"   GERD (gastroesophageal reflux disease)    Heart murmur    "grew out of it"    Hyperlipidemia    Hypertension    Hypoglycemia, unspecified    IDDM (insulin dependent diabetes mellitus)    type 2   Impotence of organic origin    Iron deficiency anemia    Memory loss    mild   Other malaise and fatigue    Pneumonia 05/2019   PONV (postoperative nausea and vomiting)    after wisdom teeth pulled, no problems with other surgeries   Restless leg    Rotator cuff arthropathy    Stroke ([place])    Tension headache    "sometimes" (05/23/2017)   Unspecified gastritis and gastroduodenitis with hemorrhage    Unstable angina pectoris ([place]) 04/08/2014   Coronary angiogram 05/03/2014:  Proximal LAD  3.0x12 mm Promus premier stent.  04/08/2014: Mid Cx 3.5 x 16 mm Promus DES.  03/29/2013: Mid LAD 2.75 x 38 mm Promus Premier drug-eluting stent, balloon angioplasty of D1 and distal LAD and stenting of distal RCA with 2.75 x 24 mm promos Premier drug-eluting stent 12/15/2012 widely patent.   Past Surgical History:  Procedure Laterality Date   BICEPT TENODESIS Right 12/07/2019   Procedure: RIGHT BICEPS TENODESIS;  Surgeon: Renette Butters, MD;  Location: [facility];  Service: Orthopedics;  Laterality: Right;   CARDIAC CATHETERIZATION N/A 09/25/2015   Procedure: Left Heart Cath and Coronary Angiography;  Surgeon: Adrian Prows, MD;  Location: [place] [facility];  Service: Cardiovascular;  Laterality: N/A;   CARDIAC CATHETERIZATION N/A 09/25/2015   Procedure: Intravascular Pressure Wire/FFR Study;  Surgeon: Adrian Prows, MD;  Location: [place] [facility];  Service: Cardiovascular;  Laterality: N/A;   CORONARY ANGIOPLASTY WITH STENT PLACEMENT  12/15/2012; 04/28/2014; 05/03/2014   "2; 1; 1" , 4 stents 2 balloons   CORONARY STENT INTERVENTION N/A 05/01/2021   Procedure: CORONARY STENT INTERVENTION;  Surgeon: Adrian Prows, MD;  Location: [place] [facility];  Service: Cardiovascular;  Laterality: N/A;   FRACTIONAL FLOW RESERVE WIRE Right 03/26/2013   Procedure: FRACTIONAL FLOW RESERVE WIRE;  Surgeon: Laverda Page, MD;  Location: [facility] CATH LAB;  Service: Cardiovascular;  Laterality: Right;   FRACTIONAL FLOW RESERVE WIRE N/A 05/03/2014   Procedure: FRACTIONAL FLOW RESERVE WIRE;  Surgeon: Laverda Page, MD;  Location: [facility] CATH LAB;  Service: Cardiovascular;  Laterality: N/A;   HERNIA REPAIR  [id number]   "umbilical"    INCISION AND DRAINAGE ABSCESS Left 05/2007   "groin"    KNEE SURGERY Right 1992;  2000   "calcium deposits removed" (12/15/2012)   LEFT HEART CATH AND CORONARY ANGIOGRAPHY N/A 05/23/2017   Procedure: LEFT HEART CATH AND CORONARY ANGIOGRAPHY;  Surgeon: Adrian Prows, MD;  Location: [place] [facility];  Service: Cardiovascular;  Laterality: N/A;   LEFT HEART CATH AND CORONARY ANGIOGRAPHY N/A 05/01/2021   Procedure: LEFT HEART CATH AND CORONARY ANGIOGRAPHY;  Surgeon: Adrian Prows, MD;  Location: [place] [facility];  Service: Cardiovascular;  Laterality: N/A;   LEFT HEART CATHETERIZATION WITH CORONARY ANGIOGRAM N/A 12/15/2012   Procedure: LEFT HEART CATHETERIZATION WITH CORONARY ANGIOGRAM;  Surgeon: Laverda Page, MD;  Location: [facility] CATH LAB;  Service: Cardiovascular;  Laterality: N/A;   LEFT HEART CATHETERIZATION WITH CORONARY ANGIOGRAM N/A 03/26/2013   Procedure: LEFT HEART CATHETERIZATION WITH CORONARY ANGIOGRAM;  Surgeon: Turner Daniels  Einar Gip, MD;  Location: [place]ATH LAB;  Service: Cardiovascular;  Laterality: N/A;   LEFT HEART CATHETERIZATION WITH CORONARY ANGIOGRAM N/A 04/08/2014   Procedure: LEFT HEART CATHETERIZATION WITH CORONARY ANGIOGRAM;  Surgeon: Blane Ohara, MD;  Location: [facility] CATH LAB;  Service: Cardiovascular;  Laterality: N/A;   MOUTH SURGERY Right    PERCUTANEOUS CORONARY STENT INTERVENTION (PCI-S) N/A 05/03/2014   Procedure: PERCUTANEOUS CORONARY STENT INTERVENTION (PCI-S);  Surgeon: Laverda Page, MD;  Location: [facility] CATH LAB;  Service: Cardiovascular;  Laterality: N/A;   RADIOLOGY WITH ANESTHESIA N/A 02/08/2021   Procedure: MRI WITH ANESTHESIA OF ABDOMEN WITH AND WITHOUT CONSTRAST;  Surgeon: Radiologist, Medication, MD;  Location: [facility];  Service: Radiology;  Laterality: N/A;   ROTATOR CUFF REPAIR     UMBILICAL HERNIA REPAIR  yrs ago   Social History   Socioeconomic History   Marital status: Married    Spouse name: Pam   Number of children: 2   Years of education: 12   Highest education level: High school graduate  Occupational History   Occupation: Administrator    Comment: deliveries only now   Occupation: woodworking  Tobacco Use   Smoking status: Never   Smokeless tobacco: Never  Vaping Use   Vaping Use: Never used  Substance and Sexual  Activity   Alcohol use: No   Drug use: No   Sexual activity: Not Currently  Other Topics Concern   Not on file  Social History Narrative   Right handed    Caffeine 2 cups daily   Winter time 4 cups daily   Lives family one level home    Semi Retired   Investment banker, operational of Health   Financial Resource Strain: [place]  (01/18/2021)   Overall Financial Resource Strain (CARDIA)    Difficulty of Paying Living Expenses: Not very hard  Food Insecurity: No Food Insecurity (01/18/2021)   Hunger Vital Sign    Worried About Running Out of Food in the Last Year: Never true    [place] in the Last Year: Never true  Transportation Needs: No Transportation Needs (01/18/2021)   PRAPARE - Hydrologist (Medical): No    Lack of Transportation (Non-Medical): No  Physical Activity: Inactive (04/25/2017)   Exercise Vital Sign    Days of Exercise per Week: 0 days    Minutes of Exercise per Session: 0 min  Stress: No Stress Concern Present (04/25/2017)   [place]    Feeling of Stress : Only a little  Social Connections: Moderately Integrated (04/25/2017)   Social Connection and Isolation Panel [NHANES]    Frequency of Communication with Friends and Family: More than three times a week    Frequency of Social Gatherings with Friends and Family: More than three times a week    Attends Religious Services: More than 4 times per year    Active Member of [employer] or Organizations: No    Attends Archivist Meetings: Never    Marital Status: Married  Human resources officer Violence: Not At Risk (04/25/2017)   Humiliation, Afraid, Rape, and Kick questionnaire    Fear of Current or Ex-Partner: No    Emotionally Abused: No    Physically Abused: No    Sexually Abused: No   Current Outpatient Medications on File Prior to Visit  Medication Sig Dispense Refill   acetaminophen (TYLENOL) 500 MG tablet Take  500 mg by mouth every 6 (six)  hours as needed for mild pain, moderate pain, headache or fever.     albuterol (VENTOLIN HFA) 108 (90 Base) MCG/ACT inhaler TAKE 2 PUFFS BY MOUTH EVERY 6 HOURS AS NEEDED FOR WHEEZE OR SHORTNESS OF BREATH 6.7 each 2   aspirin EC 81 MG tablet Take 1 tablet (81 mg total) by mouth daily. Swallow whole. 90 tablet 3   BD PEN NEEDLE NANO 2ND GEN 32G X 4 MM MISC USE AS DIRECTED 100 each 3   clopidogrel (PLAVIX) 75 MG tablet Take 1 tablet (75 mg total) by mouth daily. 90 tablet 3   Continuous Blood Gluc Receiver (FREESTYLE LIBRE 14 DAY READER) DEVI Inject 1 Device as directed daily as needed. Use once daily as direct to check blood sugar E11.51 1 each 0   Continuous Blood Gluc Sensor (FREESTYLE LIBRE 14 DAY SENSOR) MISC USE TO TEST BLOOD SUGAR THREE TIMES DAILY. E11.51. E11.59 1 each 12   fluticasone (FLONASE) 50 MCG/ACT nasal spray Place 1 spray into both nostrils daily as needed for allergies or rhinitis. 16 g 11   gabapentin (NEURONTIN) 100 MG capsule Take 3 capsules (300 mg total) by mouth at bedtime. 180 capsule 1   glucose 4 GM chewable tablet Chew 1 tablet (4 g total) by mouth as needed for low blood sugar. 50 tablet 12   insulin aspart (NOVOLOG FLEXPEN) 100 UNIT/ML FlexPen Inject 25-30 Units into the skin See admin instructions. Inject 30 units subcutaneously in the morning, inject 25 units subcutaneously with lunch & inject 25 units subcutaneously with dinner.     Insulin Glargine (BASAGLAR KWIKPEN) 100 UNIT/ML Inject 30 Units into the skin at bedtime. 30 mL 3   isosorbide mononitrate (IMDUR) 60 MG 24 hr tablet TAKE 1 TABLET BY MOUTH EVERY DAY 90 tablet 1   LIFESCAN FINEPOINT LANCETS MISC Use to test for blood sugar three times daily dx: E11.51 300 each 1   metoprolol succinate (TOPROL-XL) 25 MG 24 hr tablet Take 50 mg by mouth daily.     nitroGLYCERIN (NITROSTAT) 0.4 MG SL tablet PLACE 1 TABLET UNDER THE TONGUE EVERY 5 MINUTES AS NEEDED FOR CHEST PAIN. 50 tablet 0    ONETOUCH ULTRA test strip USE TO TEST FOR BLOOD SUGAR THREE TIMES DAILY DX: E11.51 300 strip 11   pantoprazole (PROTONIX) 40 MG tablet TAKE ONE TABLET BY MOUTH ONCE DAILY FOR STOMACH 90 tablet 3   PRALUENT 150 MG/ML SOAJ INJECT 1 PEN INTO THE SKIN EVERY 14 (FOURTEEN) DAYS. 6 mL 1   pramipexole (MIRAPEX) 1 MG tablet Take 1 mg at 5pm, 1 mg at 7pm, and 1 mg at bedtime for 1 week, then decrease to 1 mg at 5pm and 1 mg at bedtime 360 tablet 1   valsartan (DIOVAN) 80 MG tablet Take 80 mg by mouth every evening.     Current Facility-Administered Medications on File Prior to Visit  Medication Dose Route Frequency Provider Last Rate Last Admin   [COMPLETED] hydrALAZINE (APRESOLINE) injection 10 mg  10 mg Intravenous Once Adrian Prows, MD   10 mg at 05/01/21 1054   Allergies  Allergen Reactions   Amlodipine Other (See Comments)    Worsening restless legs, dyspnea and leg swelling   Lyrica [Pregabalin] Other (See Comments)    Made patient very lethargic the next morning, very hard to patient to function, move, etc.    Statins Other (See Comments)    Causes Restless Legs, rhabdomyolysis   Ativan [Lorazepam] Other (See Comments)    Markedly increased restless  legs   Trulicity [Dulaglutide] Other (See Comments)    GI side effects    Family History  Adopted: Yes  Problem Relation Age of Onset   Emphysema Mother    PE: BP (!) 140/82 (BP Location: Right Arm, Patient Position: Sitting, Cuff Size: Normal)   Pulse (!) 59   Ht 5' 8" (1.727 m)   Wt 207 lb 6.4 oz (94.1 kg)   SpO2 95%   BMI 31.54 kg/m  Wt Readings from Last 3 Encounters:  10/29/21 207 lb 6.4 oz (94.1 kg)  10/22/21 206 lb (93.4 kg)  10/05/21 207 lb 12.8 oz (94.3 kg)   Constitutional: overweight, in NAD Eyes: no exophthalmos ENT: moist mucous membranes, no thyromegaly, no cervical lymphadenopathy Cardiovascular: RRR, No MRG Respiratory: CTA B Musculoskeletal: no deformities Skin: moist, warm, no rashes Neurological: no tremor  with outstretched hands  ASSESSMENT: 1. DM2, insulin-dependent, uncontrolled, with complications - CAD - h/o CVA - CKD stage III - severe hypoglycemia in 2019  2. HL  PLAN:  1. Patient with long-standing, uncontrolled diabetes, on insulin regimen, with still poor control.  Latest HbA1c from earlier this month was slightly lower, at 8.1%, still above goal. CGM interpretation: -At today's visit, we reviewed his CGM downloads: It appears that 32% of values are in target range (goal >70%), while 68% are higher than 180 (goal <25%), and 0% are lower than 70 (goal <4%).  The calculated average blood sugar is 212.  The projected HbA1c for the next 3 months (GMI) is 8.4%. -Reviewing the CGM trends, it appears that sugars are quite high, usually above target at most times of the day except for late afternoon and evening, until 9 PM.  Upon questioning, he is taking his NovoLog incorrectly, at the start of the meal, rather than 15 minutes before.  I advised him to move the doses 15-20 min before the meals, with a longer lag time if the sugars are high.  -I also advised him to take a higher dose of Basaglar to hopefully bring down his blood sugars overnight.  I would have wanted him to switch to [place] U200, but he has.  The Basaglar at home. -We also discussed about not over correcting low or high blood sugars.  I advised him to always check with the glucometer, rather than the CGM if he is trying to correct a high or low blood sugar -I also suggested to try to add a GLP-1 receptor agonist.  He tried Trulicity in the past and had nausea, but could not try to start Ozempic at the low-dose and increase as tolerated.  He may not have crossed side effects between the 2 formulations.  He agrees to try this. -I did advise him that he may need less insulin after he starts Ozempic.  For now, we discussed about possibly taking occasionally less NovoLog before lunch, since sugars after this meal are usually lower. -  I suggested to:  Patient Instructions  Please increase: - Basaglar 35 units at bedtime  Continue: - Novolog: 10-20 units before B 10-15 units before L 15-20 unit before D  Please start: - Ozempic 0.25 mg weekly in a.m. (for example on Sunday morning) x 2-4 weeks, then increase to 0.5 mg weekly in a.m. if no nausea or hypoglycemia.  Please return in 3-4 months.  - check sugars at different times of the day - check 3-4x a day, rotating checks - discussed about CBG targets for treatment: 80-130 mg/dL before meals and <180 mg/dL  after meals; target HbA1c <7%. - given sugar log and advised how to fill it and to bring it at next appt  - given foot care handout  - given instructions for hypoglycemia management "15-15 rule"  - advised for yearly eye exams  - Return to clinic in 3 mo with sugar log   2. HL - Reviewed latest lipid panel from 01/2021: LDL at goal, triglycerides slightly high, HDL slightly low: Lab Results  Component Value Date   CHOL 81 01/15/2021   HDL 31 (L) 01/15/2021   LDLCALC 15 01/15/2021   LDLDIRECT 110 (H) 06/17/2019   TRIG 174 (H) 01/15/2021   CHOLHDL 2.6 01/15/2021  - Continues Praluent without side effects.  He had side effects from statins: Restless legs and rhabdomyolysis.  - Total time spent for the visit: 40 min, in precharting, reviewing Dr. Cordelia Pen last note, obtaining medical information from the chart and from the pt, reviewing his  previous labs, evaluations, and treatments, reviewing his symptoms, counseling him about his diabetes (please see the discussed topics above), and developing a plan to further treat it; he had a number of questions which I addressed   Philemon Kingdom, MD PhD [facility] Endocrinology

## 2021-10-29 NOTE — Patient Instructions (Addendum)
Please increase: - Basaglar 35 units at bedtime  Continue: - Novolog: 10-20 units before B 10-15 units before L 15-20 unit before D  Please start: - Ozempic 0.25 mg weekly in a.m. (for example on Sunday morning) x 2-4 weeks, then increase to 0.5 mg weekly in a.m. if no nausea or hypoglycemia.  Please return in 3-4 months.  PATIENT INSTRUCTIONS FOR TYPE 2 DIABETES:  DIET AND EXERCISE Diet and exercise is an important part of diabetic treatment.  We recommended aerobic exercise in the form of brisk walking (working between 40-60% of maximal aerobic capacity, similar to brisk walking) for 150 minutes per week (such as 30 minutes five days per week) along with 3 times per week performing 'resistance' training (using various gauge rubber tubes with handles) 5-10 exercises involving the major muscle groups (upper body, lower body and core) performing 10-15 repetitions (or near fatigue) each exercise. Start at half the above goal but build slowly to reach the above goals. If limited by weight, joint pain, or disability, we recommend daily walking in a swimming pool with water up to waist to reduce pressure from joints while allow for adequate exercise.    BLOOD GLUCOSES Monitoring your blood glucoses is important for continued management of your diabetes. Please check your blood glucoses 2-4 times a day: fasting, before meals and at bedtime (you can rotate these measurements - e.g. one day check before the 3 meals, the next day check before 2 of the meals and before bedtime, etc.).   HYPOGLYCEMIA (low blood sugar) Hypoglycemia is usually a reaction to not eating, exercising, or taking too much insulin/ other diabetes drugs.  Symptoms include tremors, sweating, hunger, confusion, headache, etc. Treat IMMEDIATELY with 15 grams of Carbs: 4 glucose tablets  cup regular juice/soda 2 tablespoons raisins 4 teaspoons sugar 1 tablespoon honey Recheck blood glucose in 15 mins and repeat above if  still symptomatic/blood glucose <100.  RECOMMENDATIONS TO REDUCE YOUR RISK OF DIABETIC COMPLICATIONS: * Take your prescribed MEDICATION(S) * Follow a DIABETIC diet: Complex carbs, fiber rich foods, (monounsaturated and polyunsaturated) fats * AVOID saturated/trans fats, high fat foods, >2,300 mg salt per day. * EXERCISE at least 5 times a week for 30 minutes or preferably daily.  * DO NOT SMOKE OR DRINK more than 1 drink a day. * Check your FEET every day. Do not wear tightfitting shoes. Contact us if you develop an ulcer * See your EYE doctor once a year or more if needed * Get a FLU shot once a year * Get a PNEUMONIA vaccine once before and once after age 77 years  GOALS:  * Your Hemoglobin A1c of <7%  * fasting sugars need to be <130 * after meals sugars need to be <180 (2h after you start eating) * Your Systolic BP should be 140 or lower  * Your Diastolic BP should be 80 or lower  * Your HDL (Good Cholesterol) should be 40 or higher  * Your LDL (Bad Cholesterol) should be 100 or lower. * Your Triglycerides should be 150 or lower  * Your Urine microalbumin (kidney function) should be <30 * Your Body Mass Index should be 25 or lower    Please consider the following ways to cut down carbs and fat and increase fiber and micronutrients in your diet: - substitute whole grain for white bread or pasta - substitute brown rice for white rice - substitute 90-calorie flat bread pieces for slices of bread when possible - substitute sweet potatoes or  yams for white potatoes - substitute humus for margarine - substitute tofu for cheese when possible - substitute almond or rice milk for regular milk (would not drink soy milk daily due to concern for soy estrogen influence on breast cancer risk) - substitute dark chocolate for other sweets when possible - substitute water - can add lemon or orange slices for taste - for diet sodas (artificial sweeteners will trick your body that you can eat  sweets without getting calories and will lead you to overeating and weight gain in the long run) - do not skip breakfast or other meals (this will slow down the metabolism and will result in more weight gain over time)  - can try smoothies made from fruit and almond/rice milk in am instead of regular breakfast - can also try old-fashioned (not instant) oatmeal made with almond/rice milk in am - order the dressing on the side when eating salad at a restaurant (pour less than half of the dressing on the salad) - eat as little meat as possible - can try juicing, but should not forget that juicing will get rid of the fiber, so would alternate with eating raw veg./fruits or drinking smoothies - use as little oil as possible, even when using olive oil - can dress a salad with a mix of balsamic vinegar and lemon juice, for e.g. - use agave nectar, stevia sugar, or regular sugar rather than artificial sweateners - steam or broil/roast veggies  - snack on veggies/fruit/nuts (unsalted, preferably) when possible, rather than processed foods - reduce or eliminate aspartame in diet (it is in diet sodas, chewing gum, etc) Read the labels!  Try to read Dr. Janene Harvey book: "Program for Reversing Diabetes" for other ideas for healthy eating.

## 2021-10-30 NOTE — Therapy (Signed)
OUTPATIENT PHYSICAL THERAPY SHOULDER EVALUATION   Patient Name: Trevonn Cardullo Sr. MRN: [id number] DOB:09-Dec-1950, 71 y.o., male Today's Date: 11/01/2021   PT End of Session - 10/31/21 0827     Visit Number 1    Number of Visits 7    Date for PT Re-Evaluation 12/21/21    Authorization Type MEDICARE PART A AND B;    Authorization Time Period [employer] SUPPLEMENT    PT Start Time 0806    PT Stop Time 0850    PT Time Calculation (min) 44 min    Activity Tolerance Patient tolerated treatment well    Behavior During Therapy WFL for tasks assessed/performed             Past Medical History:  Diagnosis Date   Arthritis    "left thumb" (05/23/2017) right shoulder   Barrett's esophagus    "we've been told that it's all gone; still take RX for GERD" (05/23/2017)   Bilateral swelling of feet and ankles x 3 weeks as of 12-01-2019   CAD (coronary artery disease), native coronary artery    Coronary angiogram 05/03/2014:  Proximal LAD 3.0x12 mm Promus premier stent.  04/08/2014: Mid Cx 3.5 x 16 mm Promus DES.  03/29/2013: Mid LAD 2.75 x 38 mm Promus Premier drug-eluting stent, balloon angioplasty of D1 and distal LAD and stenting of distal RCA with 2.75 x 24 mm promos Premier drug-eluting stent 12/15/2012 widely patent.   Cervicalgia    Chronic bronchitis (HCC)    "q yr" (05/23/2017)   Complication of anesthesia    " I do not wake up very well "  slow to wake up   COVID-19 05/2019   all covid symptoms in hospital x 5 days, all symptoms resolved took monoclonal antibody tx    Dyspnea    with exertion   Family history of adverse reaction to anesthesia    "son w/PONV"   GERD (gastroesophageal reflux disease)    Heart murmur    "grew out of it"    Hyperlipidemia    Hypertension    Hypoglycemia, unspecified    IDDM (insulin dependent diabetes mellitus)    type 2   Impotence of organic origin    Iron deficiency anemia    Memory loss    mild   Other malaise and fatigue    Pneumonia 05/2019   PONV  (postoperative nausea and vomiting)    after wisdom teeth pulled, no problems with other surgeries   Restless leg    Rotator cuff arthropathy    Stroke (HCC)    Tension headache    "sometimes" (05/23/2017)   Unspecified gastritis and gastroduodenitis with hemorrhage    Unstable angina pectoris (HCC) 04/08/2014   Coronary angiogram 05/03/2014:  Proximal LAD 3.0x12 mm Promus premier stent.  04/08/2014: Mid Cx 3.5 x 16 mm Promus DES.  03/29/2013: Mid LAD 2.75 x 38 mm Promus Premier drug-eluting stent, balloon angioplasty of D1 and distal LAD and stenting of distal RCA with 2.75 x 24 mm promos Premier drug-eluting stent 12/15/2012 widely patent.   Past Surgical History:  Procedure Laterality Date   BICEPT TENODESIS Right 12/07/2019   Procedure: RIGHT BICEPS TENODESIS;  Surgeon: Sheral Apley, MD;  Location: [facility];  Service: Orthopedics;  Laterality: Right;   CARDIAC CATHETERIZATION N/A 09/25/2015   Procedure: Left Heart Cath and Coronary Angiography;  Surgeon: [name], MD;  Location: [facility] INVASIVE CV LAB;  Service: Cardiovascular;  Laterality: N/A;   CARDIAC CATHETERIZATION N/A 09/25/2015  Procedure: Intravascular Pressure Wire/FFR Study;  Surgeon: [name], MD;  Location: [facility] INVASIVE CV LAB;  Service: Cardiovascular;  Laterality: N/A;   CORONARY ANGIOPLASTY WITH STENT PLACEMENT  12/15/2012; 04/28/2014; 05/03/2014   "2; 1; 1" , 4 stents 2 balloons   CORONARY STENT INTERVENTION N/A 05/01/2021   Procedure: CORONARY STENT INTERVENTION;  Surgeon: [name], MD;  Location: [facility] INVASIVE CV LAB;  Service: Cardiovascular;  Laterality: N/A;   FRACTIONAL FLOW RESERVE WIRE Right 03/26/2013   Procedure: FRACTIONAL FLOW RESERVE WIRE;  Surgeon: Pamella Pert, MD;  Location: [facility] CATH LAB;  Service: Cardiovascular;  Laterality: Right;   FRACTIONAL FLOW RESERVE WIRE N/A 05/03/2014   Procedure: FRACTIONAL FLOW RESERVE WIRE;  Surgeon: Pamella Pert, MD;  Location: [facility] CATH LAB;  Service:  Cardiovascular;  Laterality: N/A;   HERNIA REPAIR  2010   "umbilical"    INCISION AND DRAINAGE ABSCESS Left 05/2007   "groin"    KNEE SURGERY Right 1992;  2000   "calcium deposits removed" (12/15/2012)   LEFT HEART CATH AND CORONARY ANGIOGRAPHY N/A 05/23/2017   Procedure: LEFT HEART CATH AND CORONARY ANGIOGRAPHY;  Surgeon: [name], MD;  Location: [facility] INVASIVE CV LAB;  Service: Cardiovascular;  Laterality: N/A;   LEFT HEART CATH AND CORONARY ANGIOGRAPHY N/A 05/01/2021   Procedure: LEFT HEART CATH AND CORONARY ANGIOGRAPHY;  Surgeon: [name], MD;  Location: [facility] INVASIVE CV LAB;  Service: Cardiovascular;  Laterality: N/A;   LEFT HEART CATHETERIZATION WITH CORONARY ANGIOGRAM N/A 12/15/2012   Procedure: LEFT HEART CATHETERIZATION WITH CORONARY ANGIOGRAM;  Surgeon: Pamella Pert, MD;  Location: [facility] CATH LAB;  Service: Cardiovascular;  Laterality: N/A;   LEFT HEART CATHETERIZATION WITH CORONARY ANGIOGRAM N/A 03/26/2013   Procedure: LEFT HEART CATHETERIZATION WITH CORONARY ANGIOGRAM;  Surgeon: Pamella Pert, MD;  Location: [facility] CATH LAB;  Service: Cardiovascular;  Laterality: N/A;   LEFT HEART CATHETERIZATION WITH CORONARY ANGIOGRAM N/A 04/08/2014   Procedure: LEFT HEART CATHETERIZATION WITH CORONARY ANGIOGRAM;  Surgeon: Micheline Chapman, MD;  Location: [facility] CATH LAB;  Service: Cardiovascular;  Laterality: N/A;   MOUTH SURGERY Right    PERCUTANEOUS CORONARY STENT INTERVENTION (PCI-S) N/A 05/03/2014   Procedure: PERCUTANEOUS CORONARY STENT INTERVENTION (PCI-S);  Surgeon: Pamella Pert, MD;  Location: [facility] CATH LAB;  Service: Cardiovascular;  Laterality: N/A;   RADIOLOGY WITH ANESTHESIA N/A 02/08/2021   Procedure: MRI WITH ANESTHESIA OF ABDOMEN WITH AND WITHOUT CONSTRAST;  Surgeon: Radiologist, Medication, MD;  Location: [facility] OR;  Service: Radiology;  Laterality: N/A;   ROTATOR CUFF REPAIR     UMBILICAL HERNIA REPAIR  yrs ago   Patient Active Problem List   Diagnosis Date Noted   Status post  insertion of drug-eluting stent into right coronary artery for coronary artery disease    AKI (acute kidney injury) (HCC) 02/07/2021   Elevated LFTs 02/06/2021   Pure hypercholesterolemia 02/05/2021   TIA (transient ischemic attack) 01/14/2021   Mild pulmonary hypertension (HCC) 03/06/2020   Hoarseness 05/17/2019   Gastritis with hemorrhage 04/27/2019   Diabetes mellitus type 2, insulin dependent (HCC) 04/27/2019   HLD (hyperlipidemia) 04/27/2019   Restless leg syndrome 04/27/2019   Chronic iron deficiency anemia 04/27/2019   Barrett's esophagus with esophagitis 04/27/2019   Pneumonia due to COVID-19 virus 04/27/2019   HCAP (healthcare-associated pneumonia) 04/27/2019   Severe sepsis (HCC) 04/27/2019   Severe sepsis with septic shock (HCC) 04/27/2019   Acute lower UTI 04/27/2019   Sepsis due to pneumonia (HCC) 04/26/2019   Mild renal insufficiency 04/26/2019  Hyponatremia 04/26/2019   Thrombocytopenia (HCC) 04/26/2019   COVID-19 virus infection 04/14/2019   Hypokalemia 04/14/2019   Diabetes mellitus type 2, uncontrolled 04/14/2019   Gastroesophageal reflux disease without esophagitis 04/14/2019   Simple chronic bronchitis (HCC) 10/09/2017   Small airways disease 01/04/2017   TSH elevation 07/05/2015   Plantar fasciitis 04/26/2015   Tremor 02/14/2015   Voice disorder 10/19/2014   Anemia, iron deficiency 09/22/2013   Shortness of breath 08/31/2013   Achilles tendon pain 06/16/2013   Coronary artery disease of native artery of native heart with stable angina pectoris (HCC) 12/15/2012   Impotence of organic origin 06/16/2012   Umbilical hernia without mention of obstruction or gangrene 06/16/2012   Barrett's esophagus 06/16/2012   Gastritis and gastroduodenitis with hemorrhage 06/16/2012   Hyperlipidemia 06/16/2012   Cholelithiasis 06/16/2012   Slowing of urinary stream 06/16/2012   Pain in joint, lower leg 06/16/2012   Essential hypertension 06/16/2012   Restless legs  syndrome (RLS) 06/16/2012   Hypoglycemia 06/16/2012   Tension headache 06/16/2012    PCP: [name], NP  REFERRING PROVIDER: [name], NP  REFERRING DIAG: [id number] (ICD-10-CM) - Acute pain of left shoulder  THERAPY DIAG:  Acute pain of left shoulder  Muscle weakness (generalized)  Rationale for Evaluation and Treatment Rehabilitation  ONSET DATE: 1.5 months  SUBJECTIVE:                                                                                                                                                                                      SUBJECTIVE STATEMENT: Pt reports he slipped when coming down steps when wearing socks and fell hitting the floor, injuring his L shoulder.It 's gotton a little better, but has pain with movement. No pain at rest. Is able to play golf without pain. Not taking pain medication. R handed.  PERTINENT HISTORY: Cervicalgia, IDDM, CAD  PAIN:  Are you having pain? Yes: NPRS scale: 3/10 Pain location: L shoulder, sup/lateral GH Pain description: intermittent, sharp Aggravating factors: Sleeping on the L shoulder, movement below shoulder Relieving factors: Rest Pain range 0-5/10  PRECAUTIONS: None  WEIGHT BEARING RESTRICTIONS : None  FALLS:  Has patient fallen in last 6 months? Yes. Number of falls 1 O=Incident on the steps. Pt denies balance issue  LIVING ENVIRONMENT: Lives with: lives with their family Lives in: House/apartment No issue with accessing or mobility in his home  OCCUPATION: Semi-retired, Counsellor and driver. L shoulder pain does not impact either jobs  PLOF: Independent  PATIENT GOALS Get rid of the pain  OBJECTIVE:   DIAGNOSTIC FINDINGS:  None  PATIENT SURVEYS:  FOTO: Perceived function   53%, predicted  68%   COGNITION:  Overall cognitive status: Within functional limits for tasks assessed     SENSATION: WFL  POSTURE: Forward head and rounded shoulders  UPPER EXTREMITY  ROM:   L equal to R. Pain at the end range of flexion and abd. Active ROM Right eval Left eval  Shoulder flexion  145  Shoulder extension    Shoulder abduction  150  Shoulder adduction    Shoulder internal rotation  T10  Shoulder external rotation  T3  Elbow flexion    Elbow extension    Wrist flexion    Wrist extension    Wrist ulnar deviation    Wrist radial deviation    Wrist pronation    Wrist supination    (Blank rows = not tested)  UPPER EXTREMITY MMT:  MMT Right eval Left eval  Shoulder flexion  5  Shoulder extension  5  Shoulder abduction  5  Shoulder adduction  5  Shoulder internal rotation  5  Shoulder external rotation  5  Middle trapezius    Lower trapezius    Elbow flexion    Elbow extension    Wrist flexion    Wrist extension    Wrist ulnar deviation    Wrist radial deviation    Wrist pronation    Wrist supination    Grip strength (lbs)    (Blank rows = not tested)  SHOULDER SPECIAL TESTS:  Impingement tests: Hawkins/Kennedy impingement test: positive  and Painful arc test: negative  SLAP lesions: Crank test: negative  Rotator cuff assessment: Empty can test: positive  and Full can test: negative  Biceps assessment: Yergason's test: negative Passive as well JOINT MOBILITY TESTING:  WNLS  PALPATION:  Not TTP   TODAY'S TREATMENT:  [facility] Adult PT Treatment:                                                DATE: 10/31/21 Therapeutic Exercise: - Seated Upper Trapezius Stretch  2 reps - 20 hold - Standing Shoulder Row with Anchored Resistance GTB 2 sets - 10 reps - 2 hold - Shoulder extension with resistance GTB 2 sets - 10 reps - 2 hold  PATIENT EDUCATION: Education details: Eval findings, POC, HEP Person educated: Patient Education method: Explanation, Demonstration, Tactile cues, Verbal cues, and Handouts Education comprehension: verbalized understanding, returned demonstration, verbal cues required, and tactile cues required   HOME  EXERCISE PROGRAM: Access Code: [email or web address] URL: [email or web address] Date: 10/31/2021 Prepared by: Gar Ponto  Exercises - Seated Upper Trapezius Stretch  - 2 x daily - 7 x weekly - 1 sets - 3 reps - 20 hold - Standing Shoulder Row with Anchored Resistance  - 1 x daily - 7 x weekly - 2 sets - 10 reps - 2 hold - Shoulder extension with resistance - Neutral  - 1 x daily - 7 x weekly - 2 sets - 10 reps - 2 hold  ASSESSMENT:  CLINICAL IMPRESSION: Patient is a 71 y.o. male who was seen today for physical therapy evaluation and treatment for Acute pain of left shoulder. Signs and symptoms are consistent with L shoulder impingement.  OBJECTIVE IMPAIRMENTS decreased ROM, decreased strength, impaired UE functional use, and pain.   ACTIVITY LIMITATIONS lifting and reach over head  PARTICIPATION LIMITATIONS:  over head lifting  PERSONAL FACTORS Time since onset of injury/illness/exacerbation and 1 comorbidity: IDDM  are also affecting patient's functional outcome.   REHAB POTENTIAL: Excellent  CLINICAL DECISION MAKING: Stable/uncomplicated  EVALUATION COMPLEXITY: Low   GOALS:  SHORT TERM GOALS: = LTGs  LONG TERM GOALS:  Pt will be Ind in a final HEP to maintain achieved LOF  Baseline: started Goal status: INITIAL  2. Pt will demonstrate full L shoulder flexion and abd AROM s pain for over head UE function Baseline: Pain at end range Goal status: INITIAL  3.  Pt's FOTO score will improved to the predicted value of 68% as indication of improved function  Baseline: 53% Goal status: INITIAL  4.  Pt will report a decreased in L shoulder pain to 1/10 or less with daily activities for improved L UE function and QOL Baseline: 0-5/10 pain range Goal status: INITIAL   PLAN: PT FREQUENCY: 1x/week  PT DURATION: 6 weeks  PLANNED INTERVENTIONS: Therapeutic exercises, Therapeutic activity, Patient/Family education, Self Care, Joint mobilization, Dry Needling, Electrical  stimulation, Cryotherapy, Moist heat, Taping, Ultrasound, Ionotophoresis 4mg /ml Dexamethasone, Manual therapy, and Re-evaluation  PLAN FOR NEXT SESSION: Review FOTO; assess response to HEP; progress therex as indicated; use of modalities, manual therapy; and TPDN as indicated.    Charisa Twitty MS, PT 11/01/21 6:18 AM

## 2021-10-31 ENCOUNTER — Other Ambulatory Visit: Payer: Self-pay

## 2021-10-31 ENCOUNTER — Ambulatory Visit: Payer: Medicare Other | Attending: Nurse Practitioner

## 2021-10-31 DIAGNOSIS — M6281 Muscle weakness (generalized): Secondary | ICD-10-CM | POA: Diagnosis not present

## 2021-10-31 DIAGNOSIS — M25512 Pain in left shoulder: Secondary | ICD-10-CM | POA: Insufficient documentation

## 2021-11-06 ENCOUNTER — Encounter: Payer: Self-pay | Admitting: Neurology

## 2021-11-07 ENCOUNTER — Ambulatory Visit (INDEPENDENT_AMBULATORY_CARE_PROVIDER_SITE_OTHER): Payer: Medicare Other | Admitting: Pulmonary Disease

## 2021-11-07 ENCOUNTER — Ambulatory Visit (INDEPENDENT_AMBULATORY_CARE_PROVIDER_SITE_OTHER): Payer: Medicare Other

## 2021-11-07 ENCOUNTER — Encounter: Payer: Self-pay | Admitting: Pulmonary Disease

## 2021-11-07 VITALS — BP 142/78 | HR 60 | Ht 68.0 in | Wt 207.8 lb

## 2021-11-07 DIAGNOSIS — R0602 Shortness of breath: Secondary | ICD-10-CM

## 2021-11-07 MED ORDER — BREZTRI AEROSPHERE 160-9-4.8 MCG/ACT IN AERO: 2.0000 | INHALATION_SPRAY | Freq: Two times a day (BID) | RESPIRATORY_TRACT | 0 refills | Status: AC

## 2021-11-07 NOTE — Patient Instructions (Addendum)
Shortness of breath --ARRANGE for pulmonary function tests --START trial of Breztri. Take TWO puffs in the morning and evening. Rinse mouth out after use --CXR ordered  Follow-up with me after PFTs in September/October. OK to schedule on separate days if needed

## 2021-11-07 NOTE — Progress Notes (Signed)
Subjective:   PATIENT ID: [name]. GENDER: male DOB: 17-Apr-1950, MRN: [id number]   HPI  Chief Complaint  Patient presents with   Consult    Doe about 6 or 9mo    Reason for Visit: New consult for dyspnea on exertion  [name]. Is a 71 year old male with DM2, CAD, HTN, hx CVA, CKD stage III, GERD who presents for shortness of breath.  He reports shortness of breath that began ~10 months around Nov 2022 when he had a cardiac stent. Initially had symptoms at night where he felt like he was drowning. The nighttime symptoms have resolved. Occurs during exertion including walking, using upper body to carry things. Denies cough. He has noticed that his congestion in the last 3 months associated with wheeze sometimes. He usually has 2-3 episodes of bronchitis year usually triggered in the fall with damp weather. He will have chest tightness when he is ill. Strong odors like perfumes can affect his breathing. During childhood he had bronchitis that seemed to be more prevalent in his 65s but denies asthma. Has COVID in 2021.  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 Only able to sing for 45 minutes when he used to be able to sing 3 hours. He uses albuterol as needed 3x a week on average. Completed cardiac rehab summer 2023 and would lose his breath early. He has to take frequent breaks.  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  I have personally reviewed patient's past medical/family/social history, allergies, current medications.\  Past Medical History:  Diagnosis Date   Arthritis    "left thumb" (05/23/2017) right shoulder   Barrett's esophagus    "we've been told that it's all gone; still take RX for GERD" (05/23/2017)   Bilateral swelling of feet and ankles x 3 weeks as of 12-01-2019   CAD (coronary artery disease), native coronary artery    Coronary angiogram 05/03/2014:  Proximal LAD 3.0x12 mm Promus premier stent.  04/08/2014: Mid Cx 3.5 x 16  mm Promus DES.  03/29/2013: Mid LAD 2.75 x 38 mm Promus Premier drug-eluting stent, balloon angioplasty of D1 and distal LAD and stenting of distal RCA with 2.75 x 24 mm promos Premier drug-eluting stent 12/15/2012 widely patent.   Cervicalgia    Chronic bronchitis ([place])    "q yr" ([id number])   Complication of anesthesia    " I do not wake up very well "  slow to wake up   COVID-19 05/2019   all covid symptoms in hospital x 5 days, all symptoms resolved took monoclonal antibody tx    Dyspnea    with exertion   Family history of adverse reaction to anesthesia    "son w/PONV"   GERD (gastroesophageal reflux disease)    Heart murmur    "grew out of it"    Hyperlipidemia    Hypertension    Hypoglycemia, unspecified    IDDM (insulin dependent diabetes mellitus)    type 2   Impotence of organic origin    Iron deficiency anemia    Memory loss    mild   Other malaise and fatigue    Pneumonia 05/2019   PONV (postoperative nausea and vomiting)    after wisdom teeth pulled, no problems with other surgeries   Restless leg    Rotator cuff arthropathy    Stroke (HCC)    Tension headache    "sometimes" (05/23/2017)  Unspecified gastritis and gastroduodenitis with hemorrhage    Unstable angina pectoris (HCC) 04/08/2014   Coronary angiogram 05/03/2014:  Proximal LAD 3.0x12 mm Promus premier stent.  04/08/2014: Mid Cx 3.5 x 16 mm Promus DES.  03/29/2013: Mid LAD 2.75 x 38 mm Promus Premier drug-eluting stent, balloon angioplasty of D1 and distal LAD and stenting of distal RCA with 2.75 x 24 mm promos Premier drug-eluting stent 12/15/2012 widely patent.     Family History  Adopted: Yes  Problem Relation Age of Onset   Emphysema Mother      Social History   Occupational History   Occupation: Naval architect    Comment: deliveries only now   Occupation: woodworking  Tobacco Use   Smoking status: Never   Smokeless tobacco: Never  Building services engineer Use: Never used  Substance and Sexual  Activity   Alcohol use: No   Drug use: No   Sexual activity: Not Currently    Allergies  Allergen Reactions   Amlodipine Other (See Comments)    Worsening restless legs, dyspnea and leg swelling   Lyrica [Pregabalin] Other (See Comments)    Made patient very lethargic the next morning, very hard to patient to function, move, etc.    Statins Other (See Comments)    Causes Restless Legs, rhabdomyolysis   Ativan [Lorazepam] Other (See Comments)    Markedly increased restless legs   Trulicity [Dulaglutide] Other (See Comments)    GI side effects      Outpatient Medications Prior to Visit  Medication Sig Dispense Refill   acetaminophen (TYLENOL) 500 MG tablet Take 500 mg by mouth every 6 (six) hours as needed for mild pain, moderate pain, headache or fever.     albuterol (VENTOLIN HFA) 108 (90 Base) MCG/ACT inhaler TAKE 2 PUFFS BY MOUTH EVERY 6 HOURS AS NEEDED FOR WHEEZE OR SHORTNESS OF BREATH 6.7 each 2   aspirin EC 81 MG tablet Take 1 tablet (81 mg total) by mouth daily. Swallow whole. 90 tablet 3   BD PEN NEEDLE NANO 2ND GEN 32G X 4 MM MISC USE AS DIRECTED 100 each 3   clopidogrel (PLAVIX) 75 MG tablet Take 1 tablet (75 mg total) by mouth daily. 90 tablet 3   Continuous Blood Gluc Receiver (FREESTYLE LIBRE 14 DAY READER) DEVI Inject 1 Device as directed daily as needed. Use once daily as direct to check blood sugar E11.51 1 each 0   Continuous Blood Gluc Sensor (FREESTYLE LIBRE 14 DAY SENSOR) MISC USE TO TEST BLOOD SUGAR THREE TIMES DAILY. E11.51. E11.59 1 each 12   fluticasone (FLONASE) 50 MCG/ACT nasal spray Place 1 spray into both nostrils daily as needed for allergies or rhinitis. 16 g 11   gabapentin (NEURONTIN) 100 MG capsule Take 3 capsules (300 mg total) by mouth at bedtime. 180 capsule 1   glucose 4 GM chewable tablet Chew 1 tablet (4 g total) by mouth as needed for low blood sugar. 50 tablet 12   insulin aspart (NOVOLOG FLEXPEN) 100 UNIT/ML FlexPen Inject 25-30 Units into the  skin See admin instructions. Inject 30 units subcutaneously in the morning, inject 25 units subcutaneously with lunch & inject 25 units subcutaneously with dinner.     Insulin Glargine (BASAGLAR KWIKPEN) 100 UNIT/ML Inject 30 Units into the skin at bedtime. 30 mL 3   isosorbide mononitrate (IMDUR) 60 MG 24 hr tablet TAKE 1 TABLET BY MOUTH EVERY DAY 90 tablet 1   metoprolol succinate (TOPROL-XL) 25 MG 24 hr tablet Take  50 mg by mouth daily.     nitroGLYCERIN (NITROSTAT) 0.4 MG SL tablet PLACE 1 TABLET UNDER THE TONGUE EVERY 5 MINUTES AS NEEDED FOR CHEST PAIN. 50 tablet 0   ONETOUCH ULTRA test strip USE TO TEST FOR BLOOD SUGAR THREE TIMES DAILY DX: E11.51 300 strip 11   pantoprazole (PROTONIX) 40 MG tablet TAKE ONE TABLET BY MOUTH ONCE DAILY FOR STOMACH 90 tablet 3   PRALUENT 150 MG/ML SOAJ INJECT 1 PEN INTO THE SKIN EVERY 14 (FOURTEEN) DAYS. 6 mL 1   pramipexole (MIRAPEX) 1 MG tablet Take 1 mg at 5pm, 1 mg at 7pm, and 1 mg at bedtime for 1 week, then decrease to 1 mg at 5pm and 1 mg at bedtime 360 tablet 1   Semaglutide,0.25 or 0.5MG /DOS, 2 MG/3ML SOPN Inject 0.5 Units into the skin once a week. 9 mL 3   valsartan (DIOVAN) 80 MG tablet Take 80 mg by mouth every evening.     LIFESCAN FINEPOINT LANCETS MISC Use to test for blood sugar three times daily dx: E11.51 300 each 1   Facility-Administered Medications Prior to Visit  Medication Dose Route Frequency Provider Last Rate Last Admin   [COMPLETED] hydrALAZINE (APRESOLINE) injection 10 mg  10 mg Intravenous Once Adrian Prows, MD   10 mg at 05/01/21 1054    Review of Systems  Constitutional:  Negative for chills, diaphoresis, fever, malaise/fatigue and weight loss.  HENT:  Positive for congestion.   Respiratory:  Positive for shortness of breath and wheezing. Negative for cough, hemoptysis and sputum production.   Cardiovascular:  Negative for chest pain (chest tightness), palpitations and leg swelling.     Objective:   Vitals:   11/07/21  0916  BP: (!) 142/78  Pulse: 60  SpO2: 95%  Weight: 207 lb 12.8 oz (94.3 kg)  Height: 5\' 8"  (1.727 m)   SpO2: 95 % O2 Device: None (Room air)  Physical Exam: General: Well-appearing, no acute distress HENT: [place], AT Eyes: EOMI, no scleral icterus Respiratory: Clear to auscultation bilaterally.  No crackles, wheezing or rales Cardiovascular: RRR, -M/R/G, no JVD Extremities:-Edema,-tenderness Neuro: AAO x4, CNII-XII grossly intact Psych: Normal mood, normal affect  Data Reviewed:  Imaging: CTA 02/11/22 - No PE. Visualized lung parenchyma with no pulmonary nodules, masses, infiltrate, effusion or pneumothorax.  CXR 11/07/21 - No infiltrate, effusion or edema  PFT: 01/17/16 [facility] FVC 4.32 (92%) FEV1 3.36 (93%) Ratio 78  TLC 83% DLCO 77% Interpretation: Mildly reduced DLCO. No obstructive or restrictive lung disease  Labs: CBC    Component Value Date/Time   WBC 6.0 10/22/2021 1543   RBC 4.71 10/22/2021 1543   HGB 13.3 10/22/2021 1543   HGB 14.5 04/25/2021 0949   HCT 40.3 10/22/2021 1543   HCT 43.1 04/25/2021 0949   PLT 110.0 (L) 10/22/2021 1543   PLT 137 (L) 04/25/2021 0949   MCV 85.6 10/22/2021 1543   MCV 87 04/25/2021 0949   MCH 28.6 10/05/2021 0859   MCHC 32.9 10/22/2021 1543   RDW 14.3 10/22/2021 1543   RDW 12.8 04/25/2021 0949   LYMPHSABS 1,014 10/05/2021 0859   LYMPHSABS 1.5 02/10/2015 1355   MONOABS 0.4 02/07/2021 0826   EOSABS 120 10/05/2021 0859   EOSABS 0.1 02/10/2015 1355   BASOSABS 31 10/05/2021 0859   BASOSABS 0.0 02/10/2015 1355    Assessment & Plan:   Discussion: 71 year old male with DM2, CAD, HTN, hx CVA, CKD stage III, GERD who presents for shortness of breath. Suspect underlying obstructive disease  based on symptoms and chronicity. Will trial bronchodilators and evaluate with PFTs.  Shortness of breath --ARRANGE for pulmonary function tests --START trial of Breztri. Take TWO puffs in the morning and evening. Rinse mouth out after use --CXR  ordered. No evidence of parenchymal disease  Health Maintenance Immunization History  Administered Date(s) Administered   Pneumococcal Conjugate-13 12/01/2013   Td 03/04/1998, 03/01/2017   Tdap 03/06/2011, 02/28/2017  Declined flu, pneumococcal, covid vaccine  CT Lung Screen - never smoker. Not qualified  Orders Placed This Encounter  Procedures   DG Chest 2 View    Standing Status:   Future    Number of Occurrences:   1    Standing Expiration Date:   11/08/2022    Order Specific Question:   Reason for Exam (SYMPTOM  OR DIAGNOSIS REQUIRED)    Answer:   shortness of breath    Order Specific Question:   Preferred imaging location?    Answer:   Internal   Pulmonary function test    Standing Status:   Future    Standing Expiration Date:   11/08/2022    Order Specific Question:   Where should this test be performed?    Answer:   [facility]    Order Specific Question:   Full PFT: includes the following: basic spirometry, spirometry pre & post bronchodilator, diffusion capacity (DLCO), lung volumes    Answer:   Full PFT   Meds ordered this encounter  Medications   Budeson-Glycopyrrol-Formoterol (BREZTRI AEROSPHERE) 160-9-4.8 MCG/ACT AERO    Sig: Inhale 2 puffs into the lungs in the morning and at bedtime.    Dispense:  5.9 g    Refill:  0    Order Specific Question:   Manufacturer?    Answer:   AstraZeneca [[id number]]    No follow-ups on file. After PFTs  I have spent a total time of 45-minutes on the day of the appointment reviewing prior documentation, coordinating care and discussing medical diagnosis and plan with the patient/family. Imaging, labs and tests included in this note have been reviewed and interpreted independently by me.  Olumide Dolinger Mechele Collin, MD [facility] 11/07/2021 9:37 AM  Office Number [phone]

## 2021-11-08 NOTE — Therapy (Deleted)
OUTPATIENT PHYSICAL THERAPY TREATMENT NOTE   Patient Name: Kenneth Tuohey Sr. MRN: [id number] DOB:[birth year], 71 y.o., male Today's Date: 11/08/2021  PCP: *** REFERRING PROVIDER: ***  END OF SESSION:    Past Medical History:  Diagnosis Date   Arthritis    "left thumb" (05/23/2017) right shoulder   Barrett's esophagus    "we've been told that it's all gone; still take RX for GERD" (05/23/2017)   Bilateral swelling of feet and ankles x 3 weeks as of 12-01-2019   CAD (coronary artery disease), native coronary artery    Coronary angiogram 05/03/2014:  Proximal LAD 3.0x12 mm Promus premier stent.  04/08/2014: Mid Cx 3.5 x 16 mm Promus DES.  03/29/2013: Mid LAD 2.75 x 38 mm Promus Premier drug-eluting stent, balloon angioplasty of D1 and distal LAD and stenting of distal RCA with 2.75 x 24 mm promos Premier drug-eluting stent 12/15/2012 widely patent.   Cervicalgia    Chronic bronchitis (HCC)    "q yr" (05/23/2017)   Complication of anesthesia    " I do not wake up very well "  slow to wake up   COVID-19 05/2019   all covid symptoms in hospital x 5 days, all symptoms resolved took monoclonal antibody tx    Dyspnea    with exertion   Family history of adverse reaction to anesthesia    "son w/PONV"   GERD (gastroesophageal reflux disease)    Heart murmur    "grew out of it"    Hyperlipidemia    Hypertension    Hypoglycemia, unspecified    IDDM (insulin dependent diabetes mellitus)    type 2   Impotence of organic origin    Iron deficiency anemia    Memory loss    mild   Other malaise and fatigue    Pneumonia 05/2019   PONV (postoperative nausea and vomiting)    after wisdom teeth pulled, no problems with other surgeries   Restless leg    Rotator cuff arthropathy    Stroke (HCC)    Tension headache    "sometimes" (05/23/2017)   Unspecified gastritis and gastroduodenitis with hemorrhage    Unstable angina pectoris (HCC) 04/08/2014   Coronary angiogram 05/03/2014:  Proximal LAD 3.0x12 mm  Promus premier stent.  04/08/2014: Mid Cx 3.5 x 16 mm Promus DES.  03/29/2013: Mid LAD 2.75 x 38 mm Promus Premier drug-eluting stent, balloon angioplasty of D1 and distal LAD and stenting of distal RCA with 2.75 x 24 mm promos Premier drug-eluting stent 12/15/2012 widely patent.   Past Surgical History:  Procedure Laterality Date   BICEPT TENODESIS Right 12/07/2019   Procedure: RIGHT BICEPS TENODESIS;  Surgeon: Sheral Apley, MD;  Location: [facility];  Service: Orthopedics;  Laterality: Right;   CARDIAC CATHETERIZATION N/A 09/25/2015   Procedure: Left Heart Cath and Coronary Angiography;  Surgeon: [name], MD;  Location: [facility] INVASIVE CV LAB;  Service: Cardiovascular;  Laterality: N/A;   CARDIAC CATHETERIZATION N/A 09/25/2015   Procedure: Intravascular Pressure Wire/FFR Study;  Surgeon: [name], MD;  Location: [facility] INVASIVE CV LAB;  Service: Cardiovascular;  Laterality: N/A;   CORONARY ANGIOPLASTY WITH STENT PLACEMENT  12/15/2012; 04/28/2014; 05/03/2014   "2; 1; 1" , 4 stents 2 balloons   CORONARY STENT INTERVENTION N/A 05/01/2021   Procedure: CORONARY STENT INTERVENTION;  Surgeon: [name], MD;  Location: [facility] INVASIVE CV LAB;  Service: Cardiovascular;  Laterality: N/A;   FRACTIONAL FLOW RESERVE WIRE Right 03/26/2013   Procedure: FRACTIONAL FLOW RESERVE WIRE;  Surgeon:  Pamella Pert, MD;  Location[facility] CATH LAB;  Service: Cardiovascular;  Laterality: Right;   FRACTIONAL FLOW RESERVE WIRE N/A 05/03/2014   Procedure: FRACTIONAL FLOW RESERVE WIRE;  Surgeon: Pamella Pert, MD;  Location: [facility] CATH LAB;  Service: Cardiovascular;  Laterality: N/A;   HERNIA REPAIR  2010   "umbilical"    INCISION AND DRAINAGE ABSCESS Left 05/2007   "groin"    KNEE SURGERY Right 1992;  2000   "calcium deposits removed" (12/15/2012)   LEFT HEART CATH AND CORONARY ANGIOGRAPHY N/A 05/23/2017   Procedure: LEFT HEART CATH AND CORONARY ANGIOGRAPHY;  Surgeon: [name], MD;  Location: [facility] INVASIVE CV  LAB;  Service: Cardiovascular;  Laterality: N/A;   LEFT HEART CATH AND CORONARY ANGIOGRAPHY N/A 05/01/2021   Procedure: LEFT HEART CATH AND CORONARY ANGIOGRAPHY;  Surgeon: [name], MD;  Location: [facility] INVASIVE CV LAB;  Service: Cardiovascular;  Laterality: N/A;   LEFT HEART CATHETERIZATION WITH CORONARY ANGIOGRAM N/A 12/15/2012   Procedure: LEFT HEART CATHETERIZATION WITH CORONARY ANGIOGRAM;  Surgeon: Pamella Pert, MD;  Location: [facility] CATH LAB;  Service: Cardiovascular;  Laterality: N/A;   LEFT HEART CATHETERIZATION WITH CORONARY ANGIOGRAM N/A 03/26/2013   Procedure: LEFT HEART CATHETERIZATION WITH CORONARY ANGIOGRAM;  Surgeon: Pamella Pert, MD;  Location: [facility] CATH LAB;  Service: Cardiovascular;  Laterality: N/A;   LEFT HEART CATHETERIZATION WITH CORONARY ANGIOGRAM N/A 04/08/2014   Procedure: LEFT HEART CATHETERIZATION WITH CORONARY ANGIOGRAM;  Surgeon: Micheline Chapman, MD;  Location: [facility] CATH LAB;  Service: Cardiovascular;  Laterality: N/A;   MOUTH SURGERY Right    PERCUTANEOUS CORONARY STENT INTERVENTION (PCI-S) N/A 05/03/2014   Procedure: PERCUTANEOUS CORONARY STENT INTERVENTION (PCI-S);  Surgeon: Pamella Pert, MD;  Location: [facility] CATH LAB;  Service: Cardiovascular;  Laterality: N/A;   RADIOLOGY WITH ANESTHESIA N/A 02/08/2021   Procedure: MRI WITH ANESTHESIA OF ABDOMEN WITH AND WITHOUT CONSTRAST;  Surgeon: Radiologist, Medication, MD;  Location: [facility] OR;  Service: Radiology;  Laterality: N/A;   ROTATOR CUFF REPAIR     UMBILICAL HERNIA REPAIR  yrs ago   Patient Active Problem List   Diagnosis Date Noted   Status post insertion of drug-eluting stent into right coronary artery for coronary artery disease    AKI (acute kidney injury) (HCC) 02/07/2021   Elevated LFTs 02/06/2021   Pure hypercholesterolemia 02/05/2021   TIA (transient ischemic attack) 01/14/2021   Mild pulmonary hypertension (HCC) 03/06/2020   Hoarseness 05/17/2019   Gastritis with hemorrhage 04/27/2019   Diabetes  mellitus type 2, insulin dependent (HCC) 04/27/2019   HLD (hyperlipidemia) 04/27/2019   Restless leg syndrome 04/27/2019   Chronic iron deficiency anemia 04/27/2019   Barrett's esophagus with esophagitis 04/27/2019   Pneumonia due to COVID-19 virus 04/27/2019   HCAP (healthcare-associated pneumonia) 04/27/2019   Severe sepsis (HCC) 04/27/2019   Severe sepsis with septic shock (HCC) 04/27/2019   Acute lower UTI 04/27/2019   Sepsis due to pneumonia (HCC) 04/26/2019   Mild renal insufficiency 04/26/2019   Hyponatremia 04/26/2019   Thrombocytopenia (HCC) 04/26/2019   COVID-19 virus infection 04/14/2019   Hypokalemia 04/14/2019   Diabetes mellitus type 2, uncontrolled 04/14/2019   Gastroesophageal reflux disease without esophagitis 04/14/2019   Simple chronic bronchitis (HCC) 10/09/2017   Small airways disease 01/04/2017   TSH elevation 07/05/2015   Plantar fasciitis 04/26/2015   Tremor 02/14/2015   Voice disorder 10/19/2014   Anemia, iron deficiency 09/22/2013   Shortness of breath 08/31/2013   Achilles tendon pain 06/16/2013   Coronary artery disease of native  artery of native heart with stable angina pectoris (HCC) 12/15/2012   Impotence of organic origin 06/16/2012   Umbilical hernia without mention of obstruction or gangrene 06/16/2012   Barrett's esophagus 06/16/2012   Gastritis and gastroduodenitis with hemorrhage 06/16/2012   Hyperlipidemia 06/16/2012   Cholelithiasis 06/16/2012   Slowing of urinary stream 06/16/2012   Pain in joint, lower leg 06/16/2012   Essential hypertension 06/16/2012   Restless legs syndrome (RLS) 06/16/2012   Hypoglycemia 06/16/2012   Tension headache 06/16/2012    REFERRING DIAG: ***  THERAPY DIAG:  No diagnosis found.  Rationale for Evaluation and Treatment {HABREHAB:[id number]}  PERTINENT HISTORY: ***  PRECAUTIONS: ***  SUBJECTIVE: ***  PAIN:  Are you having pain? {OPRCPAIN:27236}   OBJECTIVE: (objective measures completed at initial  evaluation unless otherwise dated) DIAGNOSTIC FINDINGS:  None   PATIENT SURVEYS:  FOTO: Perceived function   53%, predicted   68%    COGNITION:           Overall cognitive status: Within functional limits for tasks assessed                                  SENSATION: WFL   POSTURE: Forward head and rounded shoulders   UPPER EXTREMITY ROM:             L equal to R. Pain at the end range of flexion and abd. Active ROM Right eval Left eval  Shoulder flexion   145  Shoulder extension      Shoulder abduction   150  Shoulder adduction      Shoulder internal rotation   T10  Shoulder external rotation   T3  Elbow flexion      Elbow extension      Wrist flexion      Wrist extension      Wrist ulnar deviation      Wrist radial deviation      Wrist pronation      Wrist supination      (Blank rows = not tested)   UPPER EXTREMITY MMT:   MMT Right eval Left eval  Shoulder flexion   5  Shoulder extension   5  Shoulder abduction   5  Shoulder adduction   5  Shoulder internal rotation   5  Shoulder external rotation   5  Middle trapezius      Lower trapezius      Elbow flexion      Elbow extension      Wrist flexion      Wrist extension      Wrist ulnar deviation      Wrist radial deviation      Wrist pronation      Wrist supination      Grip strength (lbs)      (Blank rows = not tested)   SHOULDER SPECIAL TESTS:            Impingement tests: Hawkins/Kennedy impingement test: positive  and Painful arc test: negative            SLAP lesions: Crank test: negative            Rotator cuff assessment: Empty can test: positive  and Full can test: negative            Biceps assessment: Yergason's test: negative Passive as well JOINT MOBILITY TESTING:  WNLS   PALPATION:  Not TTP  TODAY'S TREATMENT:  [facility] Adult PT Treatment:                                                DATE: 10/31/21 Therapeutic Exercise: - Seated Upper Trapezius Stretch  2 reps - 20  hold - Standing Shoulder Row with Anchored Resistance GTB 2 sets - 10 reps - 2 hold - Shoulder extension with resistance GTB 2 sets - 10 reps - 2 hold   PATIENT EDUCATION: Education details: Eval findings, POC, HEP Person educated: Patient Education method: Explanation, Demonstration, Tactile cues, Verbal cues, and Handouts Education comprehension: verbalized understanding, returned demonstration, verbal cues required, and tactile cues required     HOME EXERCISE PROGRAM: Access Code: [email or web address] URL: [email or web address] Date: 10/31/2021 Prepared by: Joellyn Rued   Exercises - Seated Upper Trapezius Stretch  - 2 x daily - 7 x weekly - 1 sets - 3 reps - 20 hold - Standing Shoulder Row with Anchored Resistance  - 1 x daily - 7 x weekly - 2 sets - 10 reps - 2 hold - Shoulder extension with resistance - Neutral  - 1 x daily - 7 x weekly - 2 sets - 10 reps - 2 hold   ASSESSMENT:   CLINICAL IMPRESSION: Patient is a 71 y.o. male who was seen today for physical therapy evaluation and treatment for Acute pain of left shoulder. Signs and symptoms are consistent with L shoulder impingement.   OBJECTIVE IMPAIRMENTS decreased ROM, decreased strength, impaired UE functional use, and pain.    ACTIVITY LIMITATIONS lifting and reach over head   PARTICIPATION LIMITATIONS:  over head lifting   PERSONAL FACTORS Time since onset of injury/illness/exacerbation and 1 comorbidity: IDDM  are also affecting patient's functional outcome.    REHAB POTENTIAL: Excellent   CLINICAL DECISION MAKING: Stable/uncomplicated   EVALUATION COMPLEXITY: Low     GOALS:   SHORT TERM GOALS: = LTGs   LONG TERM GOALS:   Pt will be Ind in a final HEP to maintain achieved LOF  Baseline: started Goal status: INITIAL   2. Pt will demonstrate full L shoulder flexion and abd AROM s pain for over head UE function Baseline: Pain at end range Goal status: INITIAL   3.  Pt's FOTO score will improved to  the predicted value of 68% as indication of improved function  Baseline: 53% Goal status: INITIAL   4.  Pt will report a decreased in L shoulder pain to 1/10 or less with daily activities for improved L UE function and QOL Baseline: 0-5/10 pain range Goal status: INITIAL     PLAN: PT FREQUENCY: 1x/week   PT DURATION: 6 weeks   PLANNED INTERVENTIONS: Therapeutic exercises, Therapeutic activity, Patient/Family education, Self Care, Joint mobilization, Dry Needling, Electrical stimulation, Cryotherapy, Moist heat, Taping, Ultrasound, Ionotophoresis 4mg /ml Dexamethasone, Manual therapy, and Re-evaluation   PLAN FOR NEXT SESSION: Review FOTO; assess response to HEP; progress therex as indicated; use of modalities, manual therapy; and TPDN as indicated.      (Copy Eval's Objective through Plan section here)   Fiorela Pelzer, PT 11/08/2021, 10:30 AM

## 2021-11-09 ENCOUNTER — Ambulatory Visit: Payer: Medicare Other | Admitting: Physical Therapy

## 2021-11-13 ENCOUNTER — Encounter: Payer: Self-pay | Admitting: Pulmonary Disease

## 2021-11-13 NOTE — Therapy (Signed)
OUTPATIENT PHYSICAL THERAPY TREATMENT NOTE   Patient Name: Kenneth Nolden Sr. MRN: [id number] DOB:Feb 22, 1951, 71 y.o., male Today's Date: 11/14/2021  PCP: [name], NP REFERRING PROVIDER: [name], NP  END OF SESSION:   PT End of Session - 11/14/21 0725     Visit Number 2    Number of Visits 7    Date for PT Re-Evaluation 12/21/21    Authorization Type MEDICARE PART A AND B;    Authorization Time Period [employer] SUPPLEMENT    PT Start Time [phone]    PT Stop Time 0800    PT Time Calculation (min) 42 min    Activity Tolerance Patient tolerated treatment well    Behavior During Therapy WFL for tasks assessed/performed             Past Medical History:  Diagnosis Date   Arthritis    "left thumb" (05/23/2017) right shoulder   Barrett's esophagus    "we've been told that it's all gone; still take RX for GERD" (05/23/2017)   Bilateral swelling of feet and ankles x 3 weeks as of 12-01-2019   CAD (coronary artery disease), native coronary artery    Coronary angiogram 05/03/2014:  Proximal LAD 3.0x12 mm Promus premier stent.  04/08/2014: Mid Cx 3.5 x 16 mm Promus DES.  03/29/2013: Mid LAD 2.75 x 38 mm Promus Premier drug-eluting stent, balloon angioplasty of D1 and distal LAD and stenting of distal RCA with 2.75 x 24 mm promos Premier drug-eluting stent 12/15/2012 widely patent.   Cervicalgia    Chronic bronchitis (HCC)    "q yr" (05/23/2017)   Complication of anesthesia    " I do not wake up very well "  slow to wake up   COVID-19 05/2019   all covid symptoms in hospital x 5 days, all symptoms resolved took monoclonal antibody tx    Dyspnea    with exertion   Family history of adverse reaction to anesthesia    "son w/PONV"   GERD (gastroesophageal reflux disease)    Heart murmur    "grew out of it"    Hyperlipidemia    Hypertension    Hypoglycemia, unspecified    IDDM (insulin dependent diabetes mellitus)    type 2   Impotence of organic origin    Iron deficiency  anemia    Memory loss    mild   Other malaise and fatigue    Pneumonia 05/2019   PONV (postoperative nausea and vomiting)    after wisdom teeth pulled, no problems with other surgeries   Restless leg    Rotator cuff arthropathy    Stroke (HCC)    Tension headache    "sometimes" (05/23/2017)   Unspecified gastritis and gastroduodenitis with hemorrhage    Unstable angina pectoris (HCC) 04/08/2014   Coronary angiogram 05/03/2014:  Proximal LAD 3.0x12 mm Promus premier stent.  04/08/2014: Mid Cx 3.5 x 16 mm Promus DES.  03/29/2013: Mid LAD 2.75 x 38 mm Promus Premier drug-eluting stent, balloon angioplasty of D1 and distal LAD and stenting of distal RCA with 2.75 x 24 mm promos Premier drug-eluting stent 12/15/2012 widely patent.   Past Surgical History:  Procedure Laterality Date   BICEPT TENODESIS Right 12/07/2019   Procedure: RIGHT BICEPS TENODESIS;  Surgeon: Sheral Apley, MD;  Location: [facility];  Service: Orthopedics;  Laterality: Right;   CARDIAC CATHETERIZATION N/A 09/25/2015   Procedure: Left Heart Cath and Coronary Angiography;  Surgeon: [name], MD;  Location: [facility] INVASIVE  CV LAB;  Service: Cardiovascular;  Laterality: N/A;   CARDIAC CATHETERIZATION N/A 09/25/2015   Procedure: Intravascular Pressure Wire/FFR Study;  Surgeon: Yates DecampJay Ganji, MD;  [facility];  Service: Cardiovascular;  Laterality: N/A;   CORONARY ANGIOPLASTY WITH STENT PLACEMENT  12/15/2012; 04/28/2014; 05/03/2014   "2; 1; 1" , 4 stents 2 balloons   CORONARY STENT INTERVENTION N/A 05/01/2021   Procedure: CORONARY STENT INTERVENTION;  Surgeon: Yates DecampGanji, Jay, MD;  Location: [facility] INVASIVE CV LAB;  Service: Cardiovascular;  Laterality: N/A;   FRACTIONAL FLOW RESERVE WIRE Right 03/26/2013   Procedure: FRACTIONAL FLOW RESERVE WIRE;  Surgeon: Pamella PertJagadeesh R Ganji, MD;  [facility];  Service: Cardiovascular;  Laterality: Right;   FRACTIONAL FLOW RESERVE WIRE N/A 05/03/2014   Procedure:  FRACTIONAL FLOW RESERVE WIRE;  Surgeon: Pamella PertJagadeesh R Ganji, MD;  [facility];  Service: Cardiovascular;  Laterality: N/A;   HERNIA REPAIR  2010   "umbilical"    INCISION AND DRAINAGE ABSCESS Left 05/2007   "groin"    KNEE SURGERY Right 1992;  2000   "calcium deposits removed" (12/15/2012)   LEFT HEART CATH AND CORONARY ANGIOGRAPHY N/A 05/23/2017   Procedure: LEFT HEART CATH AND CORONARY ANGIOGRAPHY;  Surgeon: Yates DecampGanji, Jay, MD;  Location: [facility] INVASIVE CV LAB;  Service: Cardiovascular;  Laterality: N/A;   LEFT HEART CATH AND CORONARY ANGIOGRAPHY N/A 05/01/2021   Procedure: LEFT HEART CATH AND CORONARY ANGIOGRAPHY;  Surgeon: Yates DecampGanji, Jay, MD;  Location: [facility] INVASIVE CV LAB;  Service: Cardiovascular;  Laterality: N/A;   LEFT HEART CATHETERIZATION WITH CORONARY ANGIOGRAM N/A 12/15/2012   Procedure: LEFT HEART CATHETERIZATION WITH CORONARY ANGIOGRAM;  Surgeon: Pamella PertJagadeesh R Ganji, MD;  [facility];  Service: Cardiovascular;  Laterality: N/A;   LEFT HEART CATHETERIZATION WITH CORONARY ANGIOGRAM N/A 03/26/2013   Procedure: LEFT HEART CATHETERIZATION WITH CORONARY ANGIOGRAM;  Surgeon: Pamella PertJagadeesh R Ganji, MD;  [facility];  Service: Cardiovascular;  Laterality: N/A;   LEFT HEART CATHETERIZATION WITH CORONARY ANGIOGRAM N/A 04/08/2014   Procedure: LEFT HEART CATHETERIZATION WITH CORONARY ANGIOGRAM;  Surgeon: Micheline ChapmanMichael D Cooper, MD;  [facility];  Service: Cardiovascular;  Laterality: N/A;   MOUTH SURGERY Right    PERCUTANEOUS CORONARY STENT INTERVENTION (PCI-S) N/A 05/03/2014   Procedure: PERCUTANEOUS CORONARY STENT INTERVENTION (PCI-S);  Surgeon: Pamella PertJagadeesh R Ganji, MD;  [facility];  Service: Cardiovascular;  Laterality: N/A;   RADIOLOGY WITH ANESTHESIA N/A 02/08/2021   Procedure: MRI WITH ANESTHESIA OF ABDOMEN WITH AND WITHOUT CONSTRAST;  Surgeon: Radiologist, Medication, MD;  Location: [facility] OR;  Service: Radiology;  Laterality: N/A;   ROTATOR CUFF REPAIR      UMBILICAL HERNIA REPAIR  yrs ago   Patient Active Problem List   Diagnosis Date Noted   Status post insertion of drug-eluting stent into right coronary artery for coronary artery disease    AKI (acute kidney injury) (HCC) 02/07/2021   Elevated LFTs 02/06/2021   Pure hypercholesterolemia 02/05/2021   TIA (transient ischemic attack) 01/14/2021   Mild pulmonary hypertension (HCC) 03/06/2020   Hoarseness 05/17/2019   Gastritis with hemorrhage 04/27/2019   Diabetes mellitus type 2, insulin dependent (HCC) 04/27/2019   HLD (hyperlipidemia) 04/27/2019   Restless leg syndrome 04/27/2019   Chronic iron deficiency anemia 04/27/2019   Barrett's esophagus with esophagitis 04/27/2019   Pneumonia due to COVID-19 virus 04/27/2019   HCAP (healthcare-associated pneumonia) 04/27/2019   Severe sepsis (HCC) 04/27/2019   Severe sepsis with septic shock (HCC) 04/27/2019   Acute lower UTI 04/27/2019  Sepsis due to pneumonia (HCC) 04/26/2019   Mild renal insufficiency 04/26/2019   Hyponatremia 04/26/2019   Thrombocytopenia (HCC) 04/26/2019   COVID-19 virus infection 04/14/2019   Hypokalemia 04/14/2019   Diabetes mellitus type 2, uncontrolled 04/14/2019   Gastroesophageal reflux disease without esophagitis 04/14/2019   Simple chronic bronchitis (HCC) 10/09/2017   Small airways disease 01/04/2017   TSH elevation 07/05/2015   Plantar fasciitis 04/26/2015   Tremor 02/14/2015   Voice disorder 10/19/2014   Anemia, iron deficiency 09/22/2013   Shortness of breath 08/31/2013   Achilles tendon pain 06/16/2013   Coronary artery disease of native artery of native heart with stable angina pectoris (HCC) 12/15/2012   Impotence of organic origin 06/16/2012   Umbilical hernia without mention of obstruction or gangrene 06/16/2012   Barrett's esophagus 06/16/2012   Gastritis and gastroduodenitis with hemorrhage 06/16/2012   Hyperlipidemia 06/16/2012   Cholelithiasis 06/16/2012   Slowing of urinary stream  06/16/2012   Pain in joint, lower leg 06/16/2012   Essential hypertension 06/16/2012   Restless legs syndrome (RLS) 06/16/2012   Hypoglycemia 06/16/2012   Tension headache 06/16/2012    REFERRING DIAG: M25.512 (ICD-10-CM) - Acute pain of left shoulder  THERAPY DIAG:  Acute pain of left shoulder  Muscle weakness (generalized)  Rationale for Evaluation and Treatment Rehabilitation  SUBJECTIVE:                                                                                                                                                                                       SUBJECTIVE STATEMENT: Pt reports his L shoulder pain is continuing to improve. Pt reports limited completion of his HEP.   PAIN:  Are you having pain? Yes: NPRS scale: 0-1/10 Pain location: L shoulder, sup/lateral GH Pain description: intermittent, sharp Aggravating factors: Sleeping on the L shoulder, movement below shoulder Relieving factors: Rest Pain range 0-3/10  PERTINENT HISTORY: Cervicalgia, IDDM, CAD   PRECAUTIONS: None   WEIGHT BEARING RESTRICTIONS : None   FALLS:  Has patient fallen in last 6 months? Yes. Number of falls 1 O=Incident on the steps. Pt denies balance issue   PATIENT GOALS Get rid of the pain   OBJECTIVE: (objective measures completed at initial evaluation unless otherwise dated)    DIAGNOSTIC FINDINGS:  None   PATIENT SURVEYS:  FOTO: Perceived function   53%, predicted   68%    COGNITION:           Overall cognitive status: Within functional limits for tasks assessed  SENSATION: WFL   POSTURE: Forward head and rounded shoulders   UPPER EXTREMITY ROM:             L equal to R. Pain at the end range of flexion and abd. Active ROM Right eval Left eval  Shoulder flexion   145  Shoulder extension      Shoulder abduction   150  Shoulder adduction      Shoulder internal rotation   T10  Shoulder external rotation   T3  Elbow  flexion      Elbow extension      Wrist flexion      Wrist extension      Wrist ulnar deviation      Wrist radial deviation      Wrist pronation      Wrist supination      (Blank rows = not tested)   UPPER EXTREMITY MMT:   MMT Right eval Left eval  Shoulder flexion   5  Shoulder extension   5  Shoulder abduction   5  Shoulder adduction   5  Shoulder internal rotation   5  Shoulder external rotation   5  Middle trapezius      Lower trapezius      Elbow flexion      Elbow extension      Wrist flexion      Wrist extension      Wrist ulnar deviation      Wrist radial deviation      Wrist pronation      Wrist supination      Grip strength (lbs)      (Blank rows = not tested)   SHOULDER SPECIAL TESTS:            Impingement tests: Hawkins/Kennedy impingement test: positive  and Painful arc test: negative            SLAP lesions: Crank test: negative            Rotator cuff assessment: Empty can test: positive  and Full can test: negative            Biceps assessment: Yergason's test: negative Passive as well JOINT MOBILITY TESTING:  WNLS   PALPATION:  Not TTP             TODAY'S TREATMENT:  [facility] Adult PT Treatment:                                                DATE: 11/14/21 Therapeutic Exercise: Standing Shoulder Row with Anchored Resistance GTB 2 sets - 10 reps - 2 hold Shoulder extension with resistance GTB 2 sets - 10 reps - 2 hold Shoulder ER RTB 2x10 Shoulder IR RTB 2x10 Seated Upper Trapezius Stretch  2 reps - 20 hold Doorway pectoral stretch x3 20" 60d Updated HEP  [facility] Adult PT Treatment:                                                DATE: 10/31/21 Therapeutic Exercise: - Seated Upper Trapezius Stretch  2 reps - 20 hold - Standing Shoulder Row with Anchored Resistance GTB 2 sets - 10 reps - 2 hold - Shoulder extension with resistance GTB 2 sets -  10 reps - 2 hold   PATIENT EDUCATION: Education details: Eval findings, POC, HEP Person educated:  Patient Education method: Explanation, Demonstration, Tactile cues, Verbal cues, and Handouts Education comprehension: verbalized understanding, returned demonstration, verbal cues required, and tactile cues required     HOME EXERCISE PROGRAM:  Access Code: [email or web address] URL: [email or web address] Date: 11/14/2021 Prepared by: Joellyn Rued  Exercises - Seated Upper Trapezius Stretch  - 2 x daily - 7 x weekly - 1 sets - 3 reps - 20 hold - Doorway Pec Stretch at 60 Elevation  - 1 x daily - 7 x weekly - 1 sets - 3 reps - 20 hold - Standing Shoulder Row with Anchored Resistance  - 1 x daily - 7 x weekly - 2 sets - 10 reps - 2 hold - Shoulder extension with resistance - Neutral  - 1 x daily - 7 x weekly - 2 sets - 10 reps - 2 hold - Shoulder External Rotation with Anchored Resistance  - 1 x daily - 7 x weekly - 2 sets - 10 reps - 2 hold - Shoulder Internal Rotation with Resistance (Mirrored)  - 1 x daily - 7 x weekly - 2 sets - 10 reps - 2 hold - Standing Serratus Punch with Resistance  - 1 x daily - 7 x weekly - 2 sets - 10 reps - 2 hold   ASSESSMENT:   CLINICAL IMPRESSION: PT was completed for therex and the development of a HEP for L shoulder strengthening and flexibility. Pt demonstrated proper technique with verbal cueing. Pt's L shoulder is improving with no pain at the end range of flexion and less pain at the end range of abd. Pt tolerated the session without adverse effects and will continue to benefit from skilled PT to address impairments to optimize function with less pain.   OBJECTIVE IMPAIRMENTS decreased ROM, decreased strength, impaired UE functional use, and pain.    ACTIVITY LIMITATIONS lifting and reach over head   PARTICIPATION LIMITATIONS:  over head lifting   PERSONAL FACTORS Time since onset of injury/illness/exacerbation and 1 comorbidity: IDDM  are also affecting patient's functional outcome.   GOALS:   SHORT TERM GOALS: = LTGs   LONG TERM GOALS:    Pt will be Ind in a final HEP to maintain achieved LOF  Baseline: started Goal status: INITIAL   2. Pt will demonstrate full L shoulder flexion and abd AROM s pain for over head UE function Baseline: Pain at end range Goal status: INITIAL   3.  Pt's FOTO score will improved to the predicted value of 68% as indication of improved function  Baseline: 53% Goal status: INITIAL   4.  Pt will report a decreased in L shoulder pain to 1/10 or less with daily activities for improved L UE function and QOL Baseline: 0-5/10 pain range Goal status: INITIAL     PLAN: PT FREQUENCY: 1x/week   PT DURATION: 6 weeks   PLANNED INTERVENTIONS: Therapeutic exercises, Therapeutic activity, Patient/Family education, Self Care, Joint mobilization, Dry Needling, Electrical stimulation, Cryotherapy, Moist heat, Taping, Ultrasound, Ionotophoresis 4mg /ml Dexamethasone, Manual therapy, and Re-evaluation   PLAN FOR NEXT SESSION: Review FOTO; assess response to HEP; progress therex as indicated; use of modalities, manual therapy; and TPDN as indicated   MS, PT 11/14/21 8:19 AM

## 2021-11-14 ENCOUNTER — Ambulatory Visit: Payer: Medicare Other | Attending: Nurse Practitioner

## 2021-11-14 DIAGNOSIS — M25512 Pain in left shoulder: Secondary | ICD-10-CM | POA: Diagnosis not present

## 2021-11-14 DIAGNOSIS — M6281 Muscle weakness (generalized): Secondary | ICD-10-CM | POA: Insufficient documentation

## 2021-11-21 ENCOUNTER — Ambulatory Visit: Payer: Medicare Other | Admitting: Physical Therapy

## 2021-11-23 ENCOUNTER — Encounter: Payer: Medicare Other | Admitting: Nurse Practitioner

## 2021-11-23 NOTE — Progress Notes (Signed)
e

## 2021-11-27 NOTE — Therapy (Signed)
OUTPATIENT PHYSICAL THERAPY TREATMENT NOTE/Discharge   Patient Name: Kenneth Franchini Sr. MRN: [id number] DOB:07/20/1950, 71 y.o., male 67 Date: 11/28/2021  PCP: Lauree Chandler, NP REFERRING PROVIDER: Lauree Chandler, NP  END OF SESSION:   PT End of Session - 11/28/21 0723     Visit Number 3    Number of Visits 7    Date for PT Re-Evaluation 12/21/21    Authorization Type MEDICARE PART A AND B;    Authorization Time Period [employer] SUPPLEMENT    PT Start Time [phone]    PT Stop Time 0800    PT Time Calculation (min) 42 min    Activity Tolerance Patient tolerated treatment well    Behavior During Therapy WFL for tasks assessed/performed              Past Medical History:  Diagnosis Date   Arthritis    "left thumb" (05/23/2017) right shoulder   Barrett's esophagus    "we've been told that it's all gone; still take RX for GERD" (05/23/2017)   Bilateral swelling of feet and ankles x 3 weeks as of 12-01-2019   CAD (coronary artery disease), native coronary artery    Coronary angiogram 05/03/2014:  Proximal LAD 3.0x12 mm Promus premier stent.  04/08/2014: Mid Cx 3.5 x 16 mm Promus DES.  03/29/2013: Mid LAD 2.75 x 38 mm Promus Premier drug-eluting stent, balloon angioplasty of D1 and distal LAD and stenting of distal RCA with 2.75 x 24 mm promos Premier drug-eluting stent 12/15/2012 widely patent.   Cervicalgia    Chronic bronchitis ([place])    "q yr" (9/40/7680)   Complication of anesthesia    " I do not wake up very well "  slow to wake up   COVID-19 05/2019   all covid symptoms in hospital x 5 days, all symptoms resolved took monoclonal antibody tx    Dyspnea    with exertion   Family history of adverse reaction to anesthesia    "son w/PONV"   GERD (gastroesophageal reflux disease)    Heart murmur    "grew out of it"    Hyperlipidemia    Hypertension    Hypoglycemia, unspecified    IDDM (insulin dependent diabetes mellitus)    type 2   Impotence of organic origin    Iron  deficiency anemia    Memory loss    mild   Other malaise and fatigue    Pneumonia 05/2019   PONV (postoperative nausea and vomiting)    after wisdom teeth pulled, no problems with other surgeries   Restless leg    Rotator cuff arthropathy    Stroke ([place])    Tension headache    "sometimes" (05/23/2017)   Unspecified gastritis and gastroduodenitis with hemorrhage    Unstable angina pectoris ([place]) 04/08/2014   Coronary angiogram 05/03/2014:  Proximal LAD 3.0x12 mm Promus premier stent.  04/08/2014: Mid Cx 3.5 x 16 mm Promus DES.  03/29/2013: Mid LAD 2.75 x 38 mm Promus Premier drug-eluting stent, balloon angioplasty of D1 and distal LAD and stenting of distal RCA with 2.75 x 24 mm promos Premier drug-eluting stent 12/15/2012 widely patent.   Past Surgical History:  Procedure Laterality Date   BICEPT TENODESIS Right 12/07/2019   Procedure: RIGHT BICEPS TENODESIS;  Surgeon: Renette Butters, MD;  [facility];  Service: Orthopedics;  Laterality: Right;   CARDIAC CATHETERIZATION N/A 09/25/2015   Procedure: Left Heart Cath and Coronary Angiography;  Surgeon: Adrian Prows, MD;  Location: [facility]  INVASIVE CV LAB;  Service: Cardiovascular;  Laterality: N/A;   CARDIAC CATHETERIZATION N/A 09/25/2015   Procedure: Intravascular Pressure Wire/FFR Study;  Surgeon: Adrian Prows, MD;  Location: [place] CV LAB;  Service: Cardiovascular;  Laterality: N/A;   CORONARY ANGIOPLASTY WITH STENT PLACEMENT  12/15/2012; 04/28/2014; 05/03/2014   "2; 1; 1" , 4 stents 2 balloons   CORONARY STENT INTERVENTION N/A 05/01/2021   Procedure: CORONARY STENT INTERVENTION;  Surgeon: Adrian Prows, MD;  Location: [place] CV LAB;  Service: Cardiovascular;  Laterality: N/A;   FRACTIONAL FLOW RESERVE WIRE Right 03/26/2013   Procedure: FRACTIONAL FLOW RESERVE WIRE;  Surgeon: Laverda Page, MD;  Location: [facility] CATH LAB;  Service: Cardiovascular;  Laterality: Right;   FRACTIONAL FLOW RESERVE WIRE N/A 05/03/2014    Procedure: FRACTIONAL FLOW RESERVE WIRE;  Surgeon: Laverda Page, MD;  Location: [facility] CATH LAB;  Service: Cardiovascular;  Laterality: N/A;   HERNIA REPAIR  4128   "umbilical"    INCISION AND DRAINAGE ABSCESS Left 05/2007   "groin"    KNEE SURGERY Right 1992;  2000   "calcium deposits removed" (12/15/2012)   LEFT HEART CATH AND CORONARY ANGIOGRAPHY N/A 05/23/2017   Procedure: LEFT HEART CATH AND CORONARY ANGIOGRAPHY;  Surgeon: Adrian Prows, MD;  Location: [place] CV LAB;  Service: Cardiovascular;  Laterality: N/A;   LEFT HEART CATH AND CORONARY ANGIOGRAPHY N/A 05/01/2021   Procedure: LEFT HEART CATH AND CORONARY ANGIOGRAPHY;  Surgeon: Adrian Prows, MD;  Location: [place] CV LAB;  Service: Cardiovascular;  Laterality: N/A;   LEFT HEART CATHETERIZATION WITH CORONARY ANGIOGRAM N/A 12/15/2012   Procedure: LEFT HEART CATHETERIZATION WITH CORONARY ANGIOGRAM;  Surgeon: Laverda Page, MD;  Location: [facility] CATH LAB;  Service: Cardiovascular;  Laterality: N/A;   LEFT HEART CATHETERIZATION WITH CORONARY ANGIOGRAM N/A 03/26/2013   Procedure: LEFT HEART CATHETERIZATION WITH CORONARY ANGIOGRAM;  Surgeon: Laverda Page, MD;  Location: [facility] CATH LAB;  Service: Cardiovascular;  Laterality: N/A;   LEFT HEART CATHETERIZATION WITH CORONARY ANGIOGRAM N/A 04/08/2014   Procedure: LEFT HEART CATHETERIZATION WITH CORONARY ANGIOGRAM;  Surgeon: Blane Ohara, MD;  Location: [facility] CATH LAB;  Service: Cardiovascular;  Laterality: N/A;   MOUTH SURGERY Right    PERCUTANEOUS CORONARY STENT INTERVENTION (PCI-S) N/A 05/03/2014   Procedure: PERCUTANEOUS CORONARY STENT INTERVENTION (PCI-S);  Surgeon: Laverda Page, MD;  Location: [facility] CATH LAB;  Service: Cardiovascular;  Laterality: N/A;   RADIOLOGY WITH ANESTHESIA N/A 02/08/2021   Procedure: MRI WITH ANESTHESIA OF ABDOMEN WITH AND WITHOUT CONSTRAST;  Surgeon: Radiologist, Medication, MD;  Location: [place];  Service: Radiology;  Laterality: N/A;   ROTATOR CUFF REPAIR      UMBILICAL HERNIA REPAIR  yrs ago   Patient Active Problem List   Diagnosis Date Noted   Status post insertion of drug-eluting stent into right coronary artery for coronary artery disease    AKI (acute kidney injury) ([place]) 02/07/2021   Elevated LFTs 02/06/2021   Pure hypercholesterolemia 02/05/2021   TIA (transient ischemic attack) 01/14/2021   Mild pulmonary hypertension ([place]) 03/06/2020   Hoarseness 05/17/2019   Gastritis with hemorrhage 04/27/2019   Diabetes mellitus type 2, insulin dependent ([place]) 04/27/2019   HLD (hyperlipidemia) 04/27/2019   Restless leg syndrome 04/27/2019   Chronic iron deficiency anemia 04/27/2019   Barrett's esophagus with esophagitis 04/27/2019   Pneumonia due to COVID-19 virus 04/27/2019   HCAP (healthcare-associated pneumonia) 04/27/2019   Severe sepsis ([place]) 04/27/2019   Severe sepsis with septic shock ([place]) 04/27/2019   Acute lower UTI  04/27/2019   Sepsis due to pneumonia ([place]) 04/26/2019   Mild renal insufficiency 04/26/2019   Hyponatremia 04/26/2019   Thrombocytopenia ([place]) 04/26/2019   COVID-19 virus infection 04/14/2019   Hypokalemia 04/14/2019   Diabetes mellitus type 2, uncontrolled 04/14/2019   Gastroesophageal reflux disease without esophagitis 04/14/2019   Simple chronic bronchitis ([place]) 10/09/2017   Small airways disease 01/04/2017   TSH elevation 07/05/2015   Plantar fasciitis 04/26/2015   Tremor 02/14/2015   Voice disorder 10/19/2014   Anemia, iron deficiency 09/22/2013   Shortness of breath 08/31/2013   Achilles tendon pain 06/16/2013   Coronary artery disease of native artery of native heart with stable angina pectoris ([place]) 12/15/2012   Impotence of organic origin 27/51/7001   Umbilical hernia without mention of obstruction or gangrene 06/16/2012   Barrett's esophagus 06/16/2012   Gastritis and gastroduodenitis with hemorrhage 06/16/2012   Hyperlipidemia 06/16/2012   Cholelithiasis 06/16/2012   Slowing of urinary stream  06/16/2012   Pain in joint, lower leg 06/16/2012   Essential hypertension 06/16/2012   Restless legs syndrome (RLS) 06/16/2012   Hypoglycemia 06/16/2012   Tension headache 06/16/2012    REFERRING DIAG: M25.512 (ICD-10-CM) - Acute pain of left shoulder  THERAPY DIAG:  Acute pain of left shoulder  Muscle weakness (generalized)  Rationale for Evaluation and Treatment Rehabilitation  SUBJECTIVE:                                                                                                                                                                                       SUBJECTIVE STATEMENT: Pt reports being primarily pain free with brief, infrequent, and low level L shoulder pain.Pt is pleased with his progress and is ready for DC.   PAIN:  Are you having pain? Yes: NPRS scale: 0/10 Pain location: L shoulder, sup/lateral GH Pain description: intermittent, sharp Aggravating factors: Sleeping on the L shoulder, movement below shoulder Relieving factors: Rest Pain range 0-1/10  PERTINENT HISTORY: Cervicalgia, IDDM, CAD   PRECAUTIONS: None   WEIGHT BEARING RESTRICTIONS : None   FALLS:  Has patient fallen in last 6 months? Yes. Number of falls 1 O=Incident on the steps. Pt denies balance issue   PATIENT GOALS Get rid of the pain   OBJECTIVE: (objective measures completed at initial evaluation unless otherwise dated)    DIAGNOSTIC FINDINGS:  None   PATIENT SURVEYS:  FOTO: Perceived function   53%, predicted   68%. 11/28/21=67%   COGNITION:           Overall cognitive status: Within functional limits for tasks assessed  SENSATION: WFL   POSTURE: Forward head and rounded shoulders   UPPER EXTREMITY ROM:             L equal to R. Pain at the end range of flexion and abd. Active ROM Right eval Left eval Lt 11/28/21  Shoulder flexion   145 150 no pain  Shoulder extension       Shoulder abduction   150 170 no pain  Shoulder  adduction       Shoulder internal rotation   T10 T9 no pain  Shoulder external rotation   T3 T4 no pain  Elbow flexion       Elbow extension       Wrist flexion       Wrist extension       Wrist ulnar deviation       Wrist radial deviation       Wrist pronation       Wrist supination       (Blank rows = not tested)   UPPER EXTREMITY MMT:   MMT Right eval Left eval  Shoulder flexion   5  Shoulder extension   5  Shoulder abduction   5  Shoulder adduction   5  Shoulder internal rotation   5  Shoulder external rotation   5  Middle trapezius      Lower trapezius      Elbow flexion      Elbow extension      Wrist flexion      Wrist extension      Wrist ulnar deviation      Wrist radial deviation      Wrist pronation      Wrist supination      Grip strength (lbs)      (Blank rows = not tested)   SHOULDER SPECIAL TESTS:            Impingement tests: Hawkins/Kennedy impingement test: positive  and Painful arc test: negative            SLAP lesions: Crank test: negative            Rotator cuff assessment: Empty can test: positive  and Full can test: negative            Biceps assessment: Yergason's test: negative Passive as well JOINT MOBILITY TESTING:  WNLS   PALPATION:  Not TTP             TODAY'S TREATMENT:  [facility] Adult PT Treatment:                                                DATE: 11/28/21 Therapeutic Exercise: Standing Shoulder Row with Anchored Resistance GTB 2 sets - 10 reps - 2 hold Shoulder extension with resistance GTB 2 sets - 10 reps - 2 hold Shoulder ER GTB 2x10 Shoulder IR GTB 2x10 Standing Serratus Punch with Resistance  2 sets - 10 reps - 2 hold Seated Upper Trapezius Stretch  3 reps - 20 hold Doorway pectoral stretch x3 20" 60d Final HEP- recommended progression c HEP and beginning gym work outs Self Care: FOTO reassessment and review Avoidance of repetitive overhead movements  [facility] Adult PT Treatment:  DATE: 11/14/21 Therapeutic Exercise: Standing Shoulder Row with Anchored Resistance GTB 2 sets - 10 reps - 2 hold Shoulder extension with resistance GTB 2 sets - 10 reps - 2 hold Shoulder ER RTB 2x10 Shoulder IR RTB 2x10 Seated Upper Trapezius Stretch  2 reps - 20 hold Doorway pectoral stretch x3 20" 60d Updated HEP  [facility] Adult PT Treatment:                                                DATE: 10/31/21 Therapeutic Exercise: - Seated Upper Trapezius Stretch  2 reps - 20 hold - Standing Shoulder Row with Anchored Resistance GTB 2 sets - 10 reps - 2 hold - Shoulder extension with resistance GTB 2 sets - 10 reps - 2 hold   PATIENT EDUCATION: Education details: Eval findings, POC, HEP Person educated: Patient Education method: Explanation, Demonstration, Tactile cues, Verbal cues, and Handouts Education comprehension: verbalized understanding, returned demonstration, verbal cues required, and tactile cues required     HOME EXERCISE PROGRAM:  Access Code: [email or web address] URL: [email or web address] Date: 11/14/2021 Prepared by: Gar Ponto  Exercises - Seated Upper Trapezius Stretch  - 2 x daily - 7 x weekly - 1 sets - 3 reps - 20 hold - Doorway Pec Stretch at 60 Elevation  - 1 x daily - 7 x weekly - 1 sets - 3 reps - 20 hold - Standing Shoulder Row with Anchored Resistance  - 1 x daily - 7 x weekly - 2 sets - 10 reps - 2 hold - Shoulder extension with resistance - Neutral  - 1 x daily - 7 x weekly - 2 sets - 10 reps - 2 hold - Shoulder External Rotation with Anchored Resistance  - 1 x daily - 7 x weekly - 2 sets - 10 reps - 2 hold - Shoulder Internal Rotation with Resistance (Mirrored)  - 1 x daily - 7 x weekly - 2 sets - 10 reps - 2 hold - Standing Serratus Punch with Resistance  - 1 x daily - 7 x weekly - 2 sets - 10 reps - 2 hold   ASSESSMENT:   CLINICAL IMPRESSION: Pt completed his last PT appt today. Pt has made good progress re: pain, ROM and function. Pt is leased  with hsi progress and ready for DC. Ptis Dced with the majority of the PT goals met (just 1 point less from meeting his FOTO perceived function goal). Pt is Ind in a HEP to maintain his achieved LOF.   OBJECTIVE IMPAIRMENTS decreased ROM, decreased strength, impaired UE functional use, and pain.    ACTIVITY LIMITATIONS lifting and reach over head   PARTICIPATION LIMITATIONS:  over head lifting   PERSONAL FACTORS Time since onset of injury/illness/exacerbation and 1 comorbidity: IDDM  are also affecting patient's functional outcome.   GOALS:   SHORT TERM GOALS: = LTGs   LONG TERM GOALS:   Pt will be Ind in a final HEP to maintain achieved LOF  Baseline: started Startus: 11/28/21=ind Goal status: MET   2. Pt will demonstrate full L shoulder flexion and abd AROM s pain for over head UE function Baseline: Pain at end range Status: 11/28/21= AROM is increased and pain free Goal status: MET   3.  Pt's FOTO score will improved to the predicted value of 68% as indication of improved function  Baseline: 53% Status:  11/28/21=67% Goal status: Partial met   4.  Pt will report a decreased in L shoulder pain to 1/10 or less with daily activities for improved L UE function and QOL Baseline: 0-5/10 pain range Status: 0-1/10, infrequent Goal status: MET     PLAN: PT FREQUENCY: 1x/week   PT DURATION: 6 weeks   PLANNED INTERVENTIONS: Therapeutic exercises, Therapeutic activity, Patient/Family education, Self Care, Joint mobilization, Dry Needling, Electrical stimulation, Cryotherapy, Moist heat, Taping, Ultrasound, Ionotophoresis 1m/ml Dexamethasone, Manual therapy, and Re-evaluation   PLAN FOR NEXT SESSION: Review FOTO; assess response to HEP; progress therex as indicated; use of modalities, manual therapy; and TPDN as indicated  PHYSICAL THERAPY DISCHARGE SUMMARY  Visits from Start of Care: 3  Current functional level related to goals / functional outcomes: See clinical impression and  goals   Remaining deficits: See clinical impression and goals   Education / Equipment: HEP   Patient agrees to discharge. Patient goals were met. Patient is being discharged due to being pleased with the current functional level.    Angelika Jerrett MS, PT 11/28/21 12:16 PM

## 2021-11-28 ENCOUNTER — Other Ambulatory Visit: Payer: Self-pay | Admitting: Cardiology

## 2021-11-28 ENCOUNTER — Ambulatory Visit: Payer: Medicare Other

## 2021-11-28 DIAGNOSIS — M6281 Muscle weakness (generalized): Secondary | ICD-10-CM

## 2021-11-28 DIAGNOSIS — M25512 Pain in left shoulder: Secondary | ICD-10-CM

## 2021-12-03 ENCOUNTER — Other Ambulatory Visit: Payer: Self-pay | Admitting: Cardiology

## 2021-12-03 DIAGNOSIS — E7800 Pure hypercholesterolemia, unspecified: Secondary | ICD-10-CM

## 2021-12-03 DIAGNOSIS — I25118 Atherosclerotic heart disease of native coronary artery with other forms of angina pectoris: Secondary | ICD-10-CM

## 2021-12-05 ENCOUNTER — Ambulatory Visit: Payer: Medicare Other | Admitting: Physical Therapy

## 2021-12-12 ENCOUNTER — Ambulatory Visit: Payer: Medicare Other

## 2021-12-19 ENCOUNTER — Ambulatory Visit: Payer: Medicare Other

## 2021-12-20 ENCOUNTER — Other Ambulatory Visit: Payer: Self-pay | Admitting: Nurse Practitioner

## 2021-12-20 DIAGNOSIS — I25118 Atherosclerotic heart disease of native coronary artery with other forms of angina pectoris: Secondary | ICD-10-CM

## 2021-12-20 DIAGNOSIS — I10 Essential (primary) hypertension: Secondary | ICD-10-CM

## 2021-12-20 DIAGNOSIS — G2581 Restless legs syndrome: Secondary | ICD-10-CM

## 2021-12-31 ENCOUNTER — Other Ambulatory Visit: Payer: Self-pay | Admitting: Cardiology

## 2021-12-31 DIAGNOSIS — E7800 Pure hypercholesterolemia, unspecified: Secondary | ICD-10-CM

## 2021-12-31 DIAGNOSIS — I25118 Atherosclerotic heart disease of native coronary artery with other forms of angina pectoris: Secondary | ICD-10-CM

## 2022-01-01 ENCOUNTER — Emergency Department (HOSPITAL_COMMUNITY)
Admission: EM | Admit: 2022-01-01 | Discharge: 2022-01-01 | Discharge status: Against Medical Advice | Payer: Medicare Other | Attending: Emergency Medicine | Admitting: Emergency Medicine

## 2022-01-01 ENCOUNTER — Emergency Department (HOSPITAL_COMMUNITY): Payer: Medicare Other

## 2022-01-01 ENCOUNTER — Telehealth: Payer: Self-pay

## 2022-01-01 ENCOUNTER — Other Ambulatory Visit: Payer: Self-pay

## 2022-01-01 ENCOUNTER — Encounter (HOSPITAL_COMMUNITY): Payer: Self-pay

## 2022-01-01 DIAGNOSIS — I10 Essential (primary) hypertension: Secondary | ICD-10-CM | POA: Diagnosis not present

## 2022-01-01 DIAGNOSIS — R569 Unspecified convulsions: Secondary | ICD-10-CM | POA: Insufficient documentation

## 2022-01-01 DIAGNOSIS — Z5321 Procedure and treatment not carried out due to patient leaving prior to being seen by health care provider: Secondary | ICD-10-CM | POA: Diagnosis not present

## 2022-01-01 DIAGNOSIS — R079 Chest pain, unspecified: Secondary | ICD-10-CM | POA: Diagnosis not present

## 2022-01-01 DIAGNOSIS — R0789 Other chest pain: Secondary | ICD-10-CM | POA: Diagnosis not present

## 2022-01-01 LAB — BASIC METABOLIC PANEL
Anion gap: 10 (ref 5–15)
BUN: 18 mg/dL (ref 8–23)
CO2: 24 mmol/L (ref 22–32)
Calcium: 9.3 mg/dL (ref 8.9–10.3)
Chloride: 105 mmol/L (ref 98–111)
Creatinine, Ser: 1.28 mg/dL — ABNORMAL HIGH (ref 0.61–1.24)
GFR, Estimated: 59 mL/min — ABNORMAL LOW (ref >60)
Glucose, Bld: 89 mg/dL (ref 70–99)
Potassium: 4.1 mmol/L (ref 3.5–5.1)
Sodium: 139 mmol/L (ref 135–145)

## 2022-01-01 LAB — CBC
HCT: 45.4 % (ref 39.0–52.0)
Hemoglobin: 15.7 g/dL (ref 13.0–17.0)
MCH: 30 pg (ref 26.0–34.0)
MCHC: 34.6 g/dL (ref 30.0–36.0)
MCV: 86.8 fL (ref 80.0–100.0)
Platelets: 146 10*3/uL — ABNORMAL LOW (ref 150–400)
RBC: 5.23 MIL/uL (ref 4.22–5.81)
RDW: 13.2 % (ref 11.5–15.5)
WBC: 7.1 10*3/uL (ref 4.0–10.5)
nRBC: 0 % (ref 0.0–0.2)

## 2022-01-01 LAB — TROPONIN I (HIGH SENSITIVITY)
Troponin I (High Sensitivity): 15 ng/L (ref <18)
Troponin I (High Sensitivity): 13 ng/L (ref <18)

## 2022-01-01 LAB — CK: Total CK: 107 U/L (ref 49–397)

## 2022-01-01 LAB — MAGNESIUM: Magnesium: 2.1 mg/dL (ref 1.7–2.4)

## 2022-01-01 NOTE — ED Notes (Signed)
This patient stated that he wanted to leave without being seen by provider. The patient stated that he has an appointment to be seen tomorrow.

## 2022-01-01 NOTE — ED Triage Notes (Signed)
Arrives EMS from home with "convulsions" r/t his restless leg syndrome. Has not slept in last 2 days.

## 2022-01-01 NOTE — Progress Notes (Unsigned)
NEUROLOGY FOLLOW UP OFFICE NOTE  Michael Walrath Sr. [id number]  Subjective:  Kenneth Devino Sr. is a 71 y.o. year old male with a history of arthritis, GERD, COPD, CAD, HTN, HLD, DM2, iron deficiency anemia, TIA (01/14/21 - left face droop and blurry vision) who we last saw on 10/22/21.  To briefly review: Patient started having restless legs syndrome. He describes needing to move his legs, then would improve after moving. It would happen worse at night and while in bed. He went untreated for many years. After his first stent in 2014, patient was put on Lipitor. He had such severe shaking, he required hospitalization and narcotics to stop his restless legs. He mentions having problems with his iron. He has taken iron supplementation and iron shots. He was not currently on any iron supplementation at initial visit on 10/22/21.   He has taken many different medications in the past, but is currently on gabapentin 200 mg at bedtime and pramipexole 1mg  at 5 pm, 1mg  at 7 pm, and 2mg  at bedtime (9-10 pm). Previously he did not take pramipexole later, but now requires it earlier and requires more medication. Symptoms continue to worsen though. He cannot sit down long or symptoms will get severe. He feels like he has to move to keep the symptoms at [place]. His symptoms have spread throughout the body now.   Patient was previously seen for restless leg syndrome (in 2015) by Dr. Delice Lesch. To briefly review, per history from clinic note on 09/29/13: "was admitted at [facility] last 09/18/2013 for leg pain due to severe RLS, found to have rhabdomyolysis with CK of 4907.  He was admitted for hydration and pain management.  He has had RLS for at least 20 years, initially affecting his legs, however since 2004 his arms have been involved as well.  He has been taking Mirapex for at least 10 years, and had been takin 0.75mg  dose 1-2 tablets at night.  This dose was causing him drowsiness.  He saw his PCP on 07/14 due to worsening RLS  symptoms. He was tried on Zanaflex and Mirapex was discontinued, which did not help.  He restarted Mirapex and had to take additional doses due to constant limb movements and pain, prompting him to go to the ER.  He was started on Gabapentin and clonazepam which did not help much, he was also given Dilaudid which quieted him down.  He was discharged home then returned to the ER due to persistently restless and painful legs bilaterally. He was described to be literally thrashing about the bed, able to answer questions but in significant distress. He tells me the symptoms would start in his right leg, leg would flex at the hip, then he stretches and leans back because the leg is jerking so violently for up to 1-1/2 hours. This eases off then starts on his left leg.  Walking and moving around helps.  Symptoms improved with pain medication. He saw his PCP a week ago and reported leg swelling, ?due to gabapentin. He was started on Sinemet 10/100mg  TID, which her reports has helped with the RLS symptoms, however wears off in the middle of the night. He would take 1/2 tab Mirapex when Sinemet wears off.  Since starting Sinemet, he reports weird dreams but no other side effects.  He had discontinued gabapentin, clonazepam, and Zanaflex.     He has infrequent headaches, dyspnea on exertion (climbing steps), occasional choking. Otherwise he denies any diplopia, dysarthria, focal numbness/tingling, bowel/bladder dysfunction.  He has a history of C1 fracture at age 64 or 59. No known family history of similar RLS because he was adopted."   Patient has previously tried Mirapex and Sinemet as well as gabapentin, clonazepam, zanaflex, and Tramadol.   He endorses occasional discomfort in his legs and swelling of his legs.   He denies fevers, chills, or unexplained weight lost.   EtOH: none  Dietary restrictions: none Family hx of neurologic disorders: patient is adopted and not sure  10/22/21 PLAN: -Blood work: iron,  TIBC, ferritin, CBC, CMP, vit D, B12 -Increase gabapentin to 300 mg at bedtime -Decrease pramipexole:             -Starting today: 1mg  at 5pm, 1mg  at 7pm, and 1mg  at bedtime for one week then,             -Decrease to: 1 mg at 5pm, 1mg  at bedtime -We will touch base after labs return and this medication adjustment and consider further treatment changes.    Since their last visit: Ferritin was low at 21. I discussed with patient and recommended adding ferrous sulfate 325 mg daily for 3 months and to take with orange juice. His B12 was also on the low end of normal, so I suggested he start B12 1000 mcg daily. Patient indicated on 11/06/21 that he was maybe a little better than prior. He was still working on cutting back on pramipexole.  Patient went to the ED on 01/01/22 for increased shaking. Patient started having worsening shaking for the last 2-3 days. He describes violent shaking. He continues to take pramipexole 3 times at night. He was taking 1mg  at 5, 1mg  at 7pm, 1mg  at 9pm. He cut back to 1mg  at 5pm and 1mg  at 9pm. He still takes the 1mg  at 7pm when the shakes are bad.  He continue to take ferrous sulfate 325 mg and B12 1000 mcg daily.  He stopped taking gabapentin because of side effects about 2 weeks ago. It was making the patient loopy (talking to self and talking crazy). It makes him tired in the morning too. He has also previously tried Lyrica with similar side effects.  MEDICATIONS:  Outpatient Encounter Medications as of 01/02/2022  Medication Sig   acetaminophen (TYLENOL) 500 MG tablet Take 500 mg by mouth every 6 (six) hours as needed for mild pain, moderate pain, headache or fever.   albuterol (VENTOLIN HFA) 108 (90 Base) MCG/ACT inhaler TAKE 2 PUFFS BY MOUTH EVERY 6 HOURS AS NEEDED FOR WHEEZE OR SHORTNESS OF BREATH   aspirin EC 81 MG tablet Take 1 tablet (81 mg total) by mouth daily. Swallow whole.   BD PEN NEEDLE NANO 2ND GEN 32G X 4 MM MISC USE AS DIRECTED    Budeson-Glycopyrrol-Formoterol (BREZTRI AEROSPHERE) 160-9-4.8 MCG/ACT AERO Inhale 2 puffs into the lungs in the morning and at bedtime.   clopidogrel (PLAVIX) 75 MG tablet Take 1 tablet (75 mg total) by mouth daily.   Continuous Blood Gluc Receiver (FREESTYLE LIBRE 14 DAY READER) DEVI Inject 1 Device as directed daily as needed. Use once daily as direct to check blood sugar E11.51   Continuous Blood Gluc Sensor (FREESTYLE LIBRE 14 DAY SENSOR) MISC USE TO TEST BLOOD SUGAR THREE TIMES DAILY. E11.51. E11.59   fluticasone (FLONASE) 50 MCG/ACT nasal spray Place 1 spray into both nostrils daily as needed for allergies or rhinitis.   glucose 4 GM chewable tablet Chew 1 tablet (4 g total) by mouth as needed for low blood sugar.  insulin aspart (NOVOLOG FLEXPEN) 100 UNIT/ML FlexPen Inject 25-30 Units into the skin See admin instructions. Inject 30 units subcutaneously in the morning, inject 25 units subcutaneously with lunch & inject 25 units subcutaneously with dinner.   Insulin Glargine (BASAGLAR KWIKPEN) 100 UNIT/ML Inject 30 Units into the skin at bedtime.   isosorbide mononitrate (IMDUR) 60 MG 24 hr tablet TAKE 1 TABLET BY MOUTH EVERY DAY   LIFESCAN FINEPOINT LANCETS MISC Use to test for blood sugar three times daily dx: E11.51   metoprolol succinate (TOPROL-XL) 25 MG 24 hr tablet TAKE 1 TABLET BY MOUTH DAILY. TAKE WITH OR IMMEDIATELY FOLLOWING A MEAL.   nitroGLYCERIN (NITROSTAT) 0.4 MG SL tablet PLACE 1 TABLET UNDER THE TONGUE EVERY 5 MINUTES AS NEEDED FOR CHEST PAIN.   ONETOUCH ULTRA test strip USE TO TEST FOR BLOOD SUGAR THREE TIMES DAILY DX: E11.51   pantoprazole (PROTONIX) 40 MG tablet TAKE ONE TABLET BY MOUTH ONCE DAILY FOR STOMACH   PRALUENT 150 MG/ML SOAJ INJECT 1 PEN INTO THE SKIN EVERY 14 (FOURTEEN) DAYS.   pramipexole (MIRAPEX) 1 MG tablet Take 1 mg by mouth 2 (two) times daily. Per patient takes 1 tablet most days however sometimes he need it twice daily   Semaglutide,0.25 or 0.5MG /DOS, 2  MG/3ML SOPN Inject 0.5 Units into the skin once a week.   valsartan (DIOVAN) 160 MG tablet TAKE 1 TABLET (160 MG TOTAL) BY MOUTH EVERY EVENING.   valsartan (DIOVAN) 80 MG tablet Take 80 mg by mouth every evening.   gabapentin (NEURONTIN) 100 MG capsule Take 3 capsules (300 mg total) by mouth at bedtime. (Patient not taking: Reported on 01/02/2022)   Facility-Administered Encounter Medications as of 01/02/2022  Medication   [COMPLETED] hydrALAZINE (APRESOLINE) injection 10 mg    PAST MEDICAL HISTORY: Past Medical History:  Diagnosis Date   Arthritis    "left thumb" (05/23/2017) right shoulder   Barrett's esophagus    "we've been told that it's all gone; still take RX for GERD" (05/23/2017)   Bilateral swelling of feet and ankles x 3 weeks as of 12-01-2019   CAD (coronary artery disease), native coronary artery    Coronary angiogram 05/03/2014:  Proximal LAD 3.0x12 mm Promus premier stent.  04/08/2014: Mid Cx 3.5 x 16 mm Promus DES.  03/29/2013: Mid LAD 2.75 x 38 mm Promus Premier drug-eluting stent, balloon angioplasty of D1 and distal LAD and stenting of distal RCA with 2.75 x 24 mm promos Premier drug-eluting stent 12/15/2012 widely patent.   Cervicalgia    Chronic bronchitis ([place])    "q yr" (Q000111Q)   Complication of anesthesia    " I do not wake up very well "  slow to wake up   COVID-19 05/2019   all covid symptoms in hospital x 5 days, all symptoms resolved took monoclonal antibody tx    Dyspnea    with exertion   Family history of adverse reaction to anesthesia    "son w/PONV"   GERD (gastroesophageal reflux disease)    Heart murmur    "grew out of it"    Hyperlipidemia    Hypertension    Hypoglycemia, unspecified    IDDM (insulin dependent diabetes mellitus)    type 2   Impotence of organic origin    Iron deficiency anemia    Memory loss    mild   Other malaise and fatigue    Pneumonia 05/2019   PONV (postoperative nausea and vomiting)    after wisdom teeth pulled, no  problems  with other surgeries   Restless leg    Rotator cuff arthropathy    Stroke [facility])    Tension headache    "sometimes" (05/23/2017)   Unspecified gastritis and gastroduodenitis with hemorrhage    Unstable angina pectoris ([place]) 04/08/2014   Coronary angiogram 05/03/2014:  Proximal LAD 3.0x12 mm Promus premier stent.  04/08/2014: Mid Cx 3.5 x 16 mm Promus DES.  03/29/2013: Mid LAD 2.75 x 38 mm Promus Premier drug-eluting stent, balloon angioplasty of D1 and distal LAD and stenting of distal RCA with 2.75 x 24 mm promos Premier drug-eluting stent 12/15/2012 widely patent.    PAST SURGICAL HISTORY: Past Surgical History:  Procedure Laterality Date   BICEPT TENODESIS Right 12/07/2019   Procedure: RIGHT BICEPS TENODESIS;  Surgeon: Renette Butters, MD;  Location: [facility];  Service: Orthopedics;  Laterality: Right;   CARDIAC CATHETERIZATION N/A 09/25/2015   Procedure: Left Heart Cath and Coronary Angiography;  Surgeon: Adrian Prows, MD;  Location: [place] CV LAB;  Service: Cardiovascular;  Laterality: N/A;   CARDIAC CATHETERIZATION N/A 09/25/2015   Procedure: Intravascular Pressure Wire/FFR Study;  Surgeon: Adrian Prows, MD;  Location: [place] CV LAB;  Service: Cardiovascular;  Laterality: N/A;   CORONARY ANGIOPLASTY WITH STENT PLACEMENT  12/15/2012; 04/28/2014; 05/03/2014   "2; 1; 1" , 4 stents 2 balloons   CORONARY STENT INTERVENTION N/A 05/01/2021   Procedure: CORONARY STENT INTERVENTION;  Surgeon: Adrian Prows, MD;  Location: [place] [facility];  Service: Cardiovascular;  Laterality: N/A;   FRACTIONAL FLOW RESERVE WIRE Right 03/26/2013   Procedure: FRACTIONAL FLOW RESERVE WIRE;  Surgeon: Laverda Page, MD;  Location: [facility] CATH LAB;  Service: Cardiovascular;  Laterality: Right;   FRACTIONAL FLOW RESERVE WIRE N/A 05/03/2014   Procedure: FRACTIONAL FLOW RESERVE WIRE;  Surgeon: Laverda Page, MD;  Location: [facility] CATH LAB;  Service: Cardiovascular;  Laterality: N/A;   HERNIA  REPAIR  [id number]   "umbilical"    INCISION AND DRAINAGE ABSCESS Left 05/2007   "groin"    KNEE SURGERY Right 1992;  2000   "calcium deposits removed" (12/15/2012)   LEFT HEART CATH AND CORONARY ANGIOGRAPHY N/A 05/23/2017   Procedure: LEFT HEART CATH AND CORONARY ANGIOGRAPHY;  Surgeon: Adrian Prows, MD;  Location: [place] [facility] LAB;  Service: Cardiovascular;  Laterality: N/A;   LEFT HEART CATH AND CORONARY ANGIOGRAPHY N/A 05/01/2021   Procedure: LEFT HEART CATH AND CORONARY ANGIOGRAPHY;  Surgeon: Adrian Prows, MD;  Location: [facility];  Service: Cardiovascular;  Laterality: N/A;   LEFT HEART CATHETERIZATION WITH CORONARY ANGIOGRAM N/A 12/15/2012   Procedure: LEFT HEART CATHETERIZATION WITH CORONARY ANGIOGRAM;  Surgeon: Laverda Page, MD;  Location: [facility] CATH LAB;  Service: Cardiovascular;  Laterality: N/A;   LEFT HEART CATHETERIZATION WITH CORONARY ANGIOGRAM N/A 03/26/2013   Procedure: LEFT HEART CATHETERIZATION WITH CORONARY ANGIOGRAM;  Surgeon: Laverda Page, MD;  Location: [facility] CATH LAB;  Service: Cardiovascular;  Laterality: N/A;   LEFT HEART CATHETERIZATION WITH CORONARY ANGIOGRAM N/A 04/08/2014   Procedure: LEFT HEART CATHETERIZATION WITH CORONARY ANGIOGRAM;  Surgeon: Blane Ohara, MD;  Location: [facility] CATH LAB;  Service: Cardiovascular;  Laterality: N/A;   MOUTH SURGERY Right    PERCUTANEOUS CORONARY STENT INTERVENTION (PCI-S) N/A 05/03/2014   Procedure: PERCUTANEOUS CORONARY STENT INTERVENTION (PCI-S);  Surgeon: Laverda Page, MD;  Location: [facility] CATH LAB;  Service: Cardiovascular;  Laterality: N/A;   RADIOLOGY WITH ANESTHESIA N/A 02/08/2021   Procedure: MRI WITH ANESTHESIA OF ABDOMEN WITH AND WITHOUT CONSTRAST;  Surgeon: Radiologist, Medication, MD;  Location: [place];  Service: Radiology;  Laterality: N/A;   ROTATOR CUFF REPAIR     UMBILICAL HERNIA REPAIR  yrs ago    ALLERGIES: Allergies  Allergen Reactions   Amlodipine Other (See Comments)    Worsening restless legs,  dyspnea and leg swelling   Lyrica [Pregabalin] Other (See Comments)    Made patient very lethargic the next morning, very hard to patient to function, move, etc.    Statins Other (See Comments)    Causes Restless Legs, rhabdomyolysis   Ativan [Lorazepam] Other (See Comments)    Markedly increased restless legs   Trulicity [Dulaglutide] Other (See Comments)    GI side effects     FAMILY HISTORY: Family History  Adopted: Yes  Problem Relation Age of Onset   Emphysema Mother     SOCIAL HISTORY: Social History   Tobacco Use   Smoking status: Never   Smokeless tobacco: Never  Vaping Use   Vaping Use: Never used  Substance Use Topics   Alcohol use: No   Drug use: No   Social History   Social History Narrative   Right handed    Caffeine 2 cups daily   Winter time 4 cups daily   Lives family one level home    Semi Retired      Objective:  Vital Signs:  BP (!) 174/90   Pulse 71   Ht 5\' 8"  (1.727 m)   Wt 203 lb (92.1 kg)   SpO2 97%   BMI 30.87 kg/m   General: No acute distress.  Patient appears well-groomed.    Neurological Exam: Mental status: alert and oriented, speech fluent and not dysarthric, language intact.  Cranial nerves: CN I: not tested CN II: pupils equal, round and reactive to light, visual fields intact CN III, IV, VI:  full range of motion, no nystagmus, no ptosis CN V: facial sensation intact. CN VII: upper and lower face symmetric CN VIII: hearing intact CN IX, X: gag intact, uvula midline CN XI: sternocleidomastoid and trapezius muscles intact CN XII: tongue midline  Bulk & Tone: normal. Dyskinesias in bilateral arms, legs, and trunk Motor:  muscle strength 5/5 throughout Deep Tendon Reflexes:  1+ throughout, except at ankles which were absent.   Sensation:  Pinprick, temperature and vibratory sensation intact. Finger to nose testing:  Without dysmetria.   Gait:  Normal station and stride.  Romberg negative.  Labs and Imaging  review: 10/22/21: Ferritin 21 B12: 248 Normal or unremarkable: Vit D CMP significant for elevated glucose (193) and BUN (28) CBC significant for platelets of 110 (chronic)  Assessment/Plan:  This is Jamas Lav Sr., a 71 y.o. male with:  RLS with augmentation Dyskinesias - likely due to excess dopamine from too much pramipexole   Plan: -Repeat iron studies today -Decrease pramipexole: -Starting today: 1mg  at 5pm and 1mg  at bedtime for 3 days then,             -Decrease to: 0.5 mg at 5pm, 1mg  at bedtime for 1 week then,             -Decrease to: 0.5 mg at 5pm, 0.5mg  at bedtime for 1 week then,  -Decrease to 0.5mg  at bedtime thereafter -Can restart gabapentin if needed -Continue B12 1000 mcg daily -Continue ferrous sulfate 325 mg daily   Return to clinic in 1 month  Total time spent reviewing records, interview, history/exam, documentation, and coordination of care on day of encounter:  35 min  Kai Levins, MD

## 2022-01-01 NOTE — ED Provider Notes (Signed)
5:47 AM Patient now complaining of chest pain.  Will add trop, CXR, and EKG.   Montine Circle, PA-C 01/01/22 Earle, [place], MD 01/02/22 [phone]

## 2022-01-01 NOTE — Telephone Encounter (Signed)
Transition Care Management Follow-up Telephone Call Date of discharge and from where: 01/01/2022; [facility] How have you been since you were released from the hospital? ok Any questions or concerns? No  Items Reviewed: Did the pt receive and understand the discharge instructions provided? No  Medications obtained and verified? No  Other? No  Any new allergies since your discharge? No  Dietary orders reviewed? No Do you have support at home? Yes   Home Care and Equipment/Supplies: Were home health services ordered? not applicable If so, what is the name of the agency? N/A  Has the agency set up a time to come to the patient's home? not applicable Were any new equipment or medical supplies ordered?  No What is the name of the medical supply agency? N/A Were you able to get the supplies/equipment? not applicable Do you have any questions related to the use of the equipment or supplies? No  Functional Questionnaire: (I = Independent and D = Dependent) ADLs: I    Bathing/Dressing- I  Meal Prep- I  Eating- I I Maintaining continence- I  Transferring/Ambulation- I  Managing Meds- I  Follow up appointments reviewed:  PCP Hospital f/u appt confirmed? Yes  Scheduled to see Lauree Chandler, NP on 01/05/2023 @ 940am. [facility] f/u appt confirmed? No  Scheduled to see N/A on N/A @ N/A. Are transportation arrangements needed? No  If their condition worsens, is the pt aware to call PCP or go to the Emergency Dept.? Yes Was the patient provided with contact information for the PCP's office or ED? Yes Was to pt encouraged to call back with questions or concerns? Yes

## 2022-01-01 NOTE — ED Provider Triage Note (Signed)
  Emergency Medicine Provider Triage Evaluation Note  MRN:  [id number]  Arrival date & time: 01/01/22    Medically screening exam initiated at 5:30 AM.   CC:   Convulsions  HPI:  Kenneth Oats Sr. is a 71 y.o. year-old male presents to the ED with chief complaint of convulsions.  Hx of restless leg.  Takes pramipexole and gabapentin.  States he normally gets dilaudid to help.  History provided by patient. ROS:  -As included in HPI PE:   Vitals:   01/01/22 0454  BP: (!) 158/90  Pulse: 73  Resp: 16  Temp: (!) 97.5 F (36.4 C)  SpO2: 96%    Non-toxic appearing No respiratory distress  MDM:  Based on signs and symptoms, restless legs is highest on my differential. I've ordered basic labs to check electrolytes in triage to expedite lab/diagnostic workup.  Patient was informed that the remainder of the evaluation will be completed by another provider, this initial triage assessment does not replace that evaluation, and the importance of remaining in the ED until their evaluation is complete.    Montine Circle, PA-C 01/01/22 0530

## 2022-01-02 ENCOUNTER — Encounter: Payer: Self-pay | Admitting: Neurology

## 2022-01-02 ENCOUNTER — Ambulatory Visit (INDEPENDENT_AMBULATORY_CARE_PROVIDER_SITE_OTHER): Payer: Medicare Other | Admitting: Neurology

## 2022-01-02 ENCOUNTER — Ambulatory Visit (INDEPENDENT_AMBULATORY_CARE_PROVIDER_SITE_OTHER): Payer: Medicare Other | Admitting: Pulmonary Disease

## 2022-01-02 ENCOUNTER — Other Ambulatory Visit (INDEPENDENT_AMBULATORY_CARE_PROVIDER_SITE_OTHER): Payer: Medicare Other

## 2022-01-02 VITALS — BP 174/90 | HR 71 | Ht 68.0 in | Wt 203.0 lb

## 2022-01-02 DIAGNOSIS — E538 Deficiency of other specified B group vitamins: Secondary | ICD-10-CM

## 2022-01-02 DIAGNOSIS — G2401 Drug induced subacute dyskinesia: Secondary | ICD-10-CM | POA: Diagnosis not present

## 2022-01-02 DIAGNOSIS — G2581 Restless legs syndrome: Secondary | ICD-10-CM

## 2022-01-02 DIAGNOSIS — E611 Iron deficiency: Secondary | ICD-10-CM

## 2022-01-02 DIAGNOSIS — E559 Vitamin D deficiency, unspecified: Secondary | ICD-10-CM

## 2022-01-02 DIAGNOSIS — R0602 Shortness of breath: Secondary | ICD-10-CM | POA: Diagnosis not present

## 2022-01-02 LAB — PULMONARY FUNCTION TEST
DL/VA % pred: 97 %
DL/VA: 3.95 ml/min/mmHg/L
DLCO cor % pred: 85 %
DLCO cor: 20.61 ml/min/mmHg
DLCO unc % pred: 85 %
DLCO unc: 20.61 ml/min/mmHg
FEF 25-75 Post: 4.09 L/sec
FEF 25-75 Pre: 3.26 L/sec
FEF2575-%Change-Post: 25 %
FEF2575-%Pred-Post: 186 %
FEF2575-%Pred-Pre: 148 %
FEV1-%Change-Post: 2 %
FEV1-%Pred-Post: 106 %
FEV1-%Pred-Pre: 103 %
FEV1-Post: 3.11 L
FEV1-Pre: 3.03 L
FEV1FVC-%Change-Post: 6 %
FEV1FVC-%Pred-Pre: 113 %
FEV6-%Change-Post: -2 %
FEV6-%Pred-Post: 93 %
FEV6-%Pred-Pre: 96 %
FEV6-Post: 3.54 L
FEV6-Pre: 3.63 L
FEV6FVC-%Change-Post: 0 %
FEV6FVC-%Pred-Post: 106 %
FEV6FVC-%Pred-Pre: 105 %
FVC-%Change-Post: -3 %
FVC-%Pred-Post: 88 %
FVC-%Pred-Pre: 91 %
FVC-Post: 3.54 L
FVC-Pre: 3.66 L
Post FEV1/FVC ratio: 88 %
Post FEV6/FVC ratio: 100 %
Pre FEV1/FVC ratio: 83 %
Pre FEV6/FVC Ratio: 99 %
RV % pred: 68 %
RV: 1.62 L
TLC % pred: 80 %
TLC: 5.31 L

## 2022-01-02 NOTE — Progress Notes (Signed)
PFT done today. 

## 2022-01-02 NOTE — Patient Instructions (Addendum)
Your excess shakes are because you have too much dopamine in your system from the pramipexole. We need to come down on this medication.  -Decrease pramipexole: -Starting today: 1mg  at 5pm and 1mg  at bedtime for 3 days then,             -Decrease to: 0.5 mg at 5pm, 1mg  at bedtime for 1 week then,             -Decrease to: 0.5 mg at 5pm, 0.5mg  at bedtime for 1 week then,  -Decrease to 0.5mg  at bedtime thereafter -Can restart gabapentin if needed -Continue ferrous sulfate 325 mg daily -Continue B12 supplementation 1000 mcg daily  I want to see you again in 1 month. Call or message me sooner if needed.  The physicians and staff at [facility] are committed to providing excellent care. You may receive a survey requesting feedback about your experience at our office. We strive to receive "very good" responses to the survey questions. If you feel that your experience would prevent you from giving the office a "very good " response, please contact our office to try to remedy the situation. We may be reached at [phone]. Thank you for taking the time out of your busy day to complete the survey.  Kai Levins, MD [facility]

## 2022-01-03 LAB — IRON,TIBC AND FERRITIN PANEL
%SAT: 24 % (calc) (ref 20–48)
Ferritin: 66 ng/mL (ref 24–380)
Iron: 66 ug/dL (ref 50–180)
TIBC: 272 mcg/dL (calc) (ref 250–425)

## 2022-01-04 ENCOUNTER — Encounter: Payer: Medicare Other | Admitting: Nurse Practitioner

## 2022-01-07 ENCOUNTER — Other Ambulatory Visit: Payer: Self-pay | Admitting: Nurse Practitioner

## 2022-01-07 DIAGNOSIS — J410 Simple chronic bronchitis: Secondary | ICD-10-CM

## 2022-01-07 NOTE — Progress Notes (Signed)
error 

## 2022-01-09 ENCOUNTER — Other Ambulatory Visit: Payer: Self-pay | Admitting: Cardiology

## 2022-01-17 ENCOUNTER — Ambulatory Visit (INDEPENDENT_AMBULATORY_CARE_PROVIDER_SITE_OTHER): Payer: Medicare Other | Admitting: Pulmonary Disease

## 2022-01-17 ENCOUNTER — Encounter (HOSPITAL_BASED_OUTPATIENT_CLINIC_OR_DEPARTMENT_OTHER): Payer: Self-pay | Admitting: Pulmonary Disease

## 2022-01-17 VITALS — BP 150/76 | HR 65 | Ht 68.0 in | Wt 201.8 lb

## 2022-01-17 DIAGNOSIS — R0602 Shortness of breath: Secondary | ICD-10-CM

## 2022-01-17 NOTE — Patient Instructions (Signed)
Shortness of breath PFTs normal --Use Albuterol daily and as needed for symptoms --Please contact our office if you wish to continue steroid inhalers  Follow-up with me as needed

## 2022-01-17 NOTE — Progress Notes (Signed)
Subjective:   PATIENT ID: [name]. GENDER: male DOB: 01-27-1951, MRN: [id number]   HPI  Chief Complaint  Patient presents with   Follow-up    PFT results    Reason for Visit: Follow-up  [name]. Is a 71 year old male with DM2, CAD, HTN, hx CVA, CKD stage III, GERD who presents for follow-up.  Initial consult He reports shortness of breath that began ~10 months around Nov 2022 when he had a cardiac stent. Initially had symptoms at night where he felt like he was drowning. The nighttime symptoms have resolved. Occurs during exertion including walking, using upper body to carry things. Denies cough. He has noticed that his congestion in the last 3 months associated with wheeze sometimes. He usually has 2-3 episodes of bronchitis year usually triggered in the fall with damp weather. He will have chest tightness when he is ill. Strong odors like perfumes can affect his breathing. During childhood he had bronchitis that seemed to be more prevalent in his 62s but denies asthma. Has COVID in 2021.  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 Only able to sing for 45 minutes when he used to be able to sing 3 hours. He uses albuterol as needed 3x a week on average. Completed cardiac rehab summer 2023 and would lose his breath early. He has to take frequent breaks.  01/17/22 Since our last visit he reports that he has been using Breztri intermittently and does feel like it helps his symptoms. Has not been taking daily. Still has symptoms shortness of breath with singing and activity requiring frequent breaks. Denies cough or wheezing. Has completed PFTs and waiting to discuss results.  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  Past Medical History:  Diagnosis Date   Arthritis    "left thumb" (05/23/2017) right shoulder   Barrett's esophagus    "we've been told that it's all gone; still take RX for GERD" (05/23/2017)   Bilateral swelling of feet and  ankles x 3 weeks as of 12-01-2019   CAD (coronary artery disease), native coronary artery    Coronary angiogram 05/03/2014:  Proximal LAD 3.0x12 mm Promus premier stent.  04/08/2014: Mid Cx 3.5 x 16 mm Promus DES.  03/29/2013: Mid LAD 2.75 x 38 mm Promus Premier drug-eluting stent, balloon angioplasty of D1 and distal LAD and stenting of distal RCA with 2.75 x 24 mm promos Premier drug-eluting stent 12/15/2012 widely patent.   Cervicalgia    Chronic bronchitis (HCC)    "q yr" (05/23/2017)   Complication of anesthesia    " I do not wake up very well "  slow to wake up   COVID-19 05/2019   all covid symptoms in hospital x 5 days, all symptoms resolved took monoclonal antibody tx    Dyspnea    with exertion   Family history of adverse reaction to anesthesia    "son w/PONV"   GERD (gastroesophageal reflux disease)    Heart murmur    "grew out of it"    Hyperlipidemia    Hypertension    Hypoglycemia, unspecified    IDDM (insulin dependent diabetes mellitus)    type 2   Impotence of organic origin    Iron deficiency anemia    Memory loss    mild   Other malaise and fatigue    Pneumonia 05/2019   PONV (postoperative nausea and vomiting)    after wisdom  teeth pulled, no problems with other surgeries   Restless leg    Rotator cuff arthropathy    Stroke ([place])    Tension headache    "sometimes" (05/23/2017)   Unspecified gastritis and gastroduodenitis with hemorrhage    Unstable angina pectoris ([place]) 04/08/2014   Coronary angiogram 05/03/2014:  Proximal LAD 3.0x12 mm Promus premier stent.  04/08/2014: Mid Cx 3.5 x 16 mm Promus DES.  03/29/2013: Mid LAD 2.75 x 38 mm Promus Premier drug-eluting stent, balloon angioplasty of D1 and distal LAD and stenting of distal RCA with 2.75 x 24 mm promos Premier drug-eluting stent 12/15/2012 widely patent.     Family History  Adopted: Yes  Problem Relation Age of Onset   Emphysema Mother      Social History   Occupational History   Occupation: Dietitian    Comment: deliveries only now   Occupation: woodworking  Tobacco Use   Smoking status: Never   Smokeless tobacco: Never  Scientific laboratory technician Use: Never used  Substance and Sexual Activity   Alcohol use: No   Drug use: No   Sexual activity: Not Currently    Allergies  Allergen Reactions   Amlodipine Other (See Comments)    Worsening restless legs, dyspnea and leg swelling   Lyrica [Pregabalin] Other (See Comments)    Made patient very lethargic the next morning, very hard to patient to function, move, etc.    Statins Other (See Comments)    Causes Restless Legs, rhabdomyolysis   Ativan [Lorazepam] Other (See Comments)    Markedly increased restless legs   Trulicity [Dulaglutide] Other (See Comments)    GI side effects      Outpatient Medications Prior to Visit  Medication Sig Dispense Refill   acetaminophen (TYLENOL) 500 MG tablet Take 500 mg by mouth every 6 (six) hours as needed for mild pain, moderate pain, headache or fever.     albuterol (VENTOLIN HFA) 108 (90 Base) MCG/ACT inhaler TAKE 2 PUFFS BY MOUTH EVERY 6 HOURS AS NEEDED FOR WHEEZE OR SHORTNESS OF BREATH 6.7 each 2   aspirin EC 81 MG tablet Take 1 tablet (81 mg total) by mouth daily. Swallow whole. 90 tablet 3   BD PEN NEEDLE NANO 2ND GEN 32G X 4 MM MISC USE AS DIRECTED 100 each 3   Budeson-Glycopyrrol-Formoterol (BREZTRI AEROSPHERE) 160-9-4.8 MCG/ACT AERO Inhale 2 puffs into the lungs in the morning and at bedtime. 5.9 g 0   clopidogrel (PLAVIX) 75 MG tablet Take 1 tablet (75 mg total) by mouth daily. 90 tablet 3   Continuous Blood Gluc Receiver (FREESTYLE LIBRE 14 DAY READER) DEVI Inject 1 Device as directed daily as needed. Use once daily as direct to check blood sugar E11.51 1 each 0   Continuous Blood Gluc Sensor (FREESTYLE LIBRE 14 DAY SENSOR) MISC USE TO TEST BLOOD SUGAR THREE TIMES DAILY. E11.51. E11.59 1 each 12   fluticasone (FLONASE) 50 MCG/ACT nasal spray Place 1 spray into both nostrils daily as  needed for allergies or rhinitis. 16 g 11   glucose 4 GM chewable tablet Chew 1 tablet (4 g total) by mouth as needed for low blood sugar. 50 tablet 12   insulin aspart (NOVOLOG FLEXPEN) 100 UNIT/ML FlexPen Inject 25-30 Units into the skin See admin instructions. Inject 30 units subcutaneously in the morning, inject 25 units subcutaneously with lunch & inject 25 units subcutaneously with dinner.     Insulin Glargine (BASAGLAR KWIKPEN) 100 UNIT/ML Inject 30 Units into the  skin at bedtime. 30 mL 3   isosorbide mononitrate (IMDUR) 60 MG 24 hr tablet TAKE 1 TABLET BY MOUTH EVERY DAY 90 tablet 1   LIFESCAN FINEPOINT LANCETS MISC Use to test for blood sugar three times daily dx: E11.51 300 each 1   metoprolol succinate (TOPROL-XL) 25 MG 24 hr tablet TAKE 1 TABLET BY MOUTH DAILY. TAKE WITH OR IMMEDIATELY FOLLOWING A MEAL. 90 tablet 1   nitroGLYCERIN (NITROSTAT) 0.4 MG SL tablet PLACE 1 TABLET UNDER THE TONGUE EVERY 5 MINUTES AS NEEDED FOR CHEST PAIN. 50 tablet 0   ONETOUCH ULTRA test strip USE TO TEST FOR BLOOD SUGAR THREE TIMES DAILY DX: E11.51 300 strip 11   pantoprazole (PROTONIX) 40 MG tablet TAKE ONE TABLET BY MOUTH ONCE DAILY FOR STOMACH 90 tablet 3   PRALUENT 150 MG/ML SOAJ INJECT 1 PEN INTO THE SKIN EVERY 14 (FOURTEEN) DAYS. 6 mL 1   pramipexole (MIRAPEX) 1 MG tablet Take 1 mg by mouth 2 (two) times daily. Per patient takes 1 tablet most days however sometimes he need it twice daily     Semaglutide,0.25 or 0.5MG /DOS, 2 MG/3ML SOPN Inject 0.5 Units into the skin once a week. 9 mL 3   valsartan (DIOVAN) 160 MG tablet TAKE 1 TABLET (160 MG TOTAL) BY MOUTH EVERY EVENING. 90 tablet 3   valsartan (DIOVAN) 80 MG tablet Take 80 mg by mouth every evening.     gabapentin (NEURONTIN) 100 MG capsule Take 3 capsules (300 mg total) by mouth at bedtime. 180 capsule 1   Facility-Administered Medications Prior to Visit  Medication Dose Route Frequency Provider Last Rate Last Admin   [COMPLETED] hydrALAZINE  (APRESOLINE) injection 10 mg  10 mg Intravenous Once Adrian Prows, MD   10 mg at 05/01/21 1054    Review of Systems  Constitutional:  Negative for chills, diaphoresis, fever, malaise/fatigue and weight loss.  HENT:  Negative for congestion.   Respiratory:  Positive for shortness of breath. Negative for cough, hemoptysis, sputum production and wheezing.   Cardiovascular:  Negative for chest pain, palpitations and leg swelling.     Objective:   Vitals:   01/17/22 1008  BP: (!) 150/76  Pulse: 65  SpO2: 98%  Weight: 201 lb 13.3 oz (91.6 kg)  Height: 5\' 8"  (1.727 m)   SpO2: 98 % O2 Device: None (Room air)  Physical Exam: General: Well-appearing, no acute distress HENT: [place], AT Eyes: EOMI, no scleral icterus Respiratory: Clear to auscultation bilaterally.  No crackles, wheezing or rales Cardiovascular: RRR, -M/R/G, no JVD Extremities:-Edema,-tenderness Neuro: AAO x4, CNII-XII grossly intact Psych: Normal mood, normal affect  Data Reviewed:  Imaging: CTA 02/11/22 - No PE. Visualized lung parenchyma with no pulmonary nodules, masses, infiltrate, effusion or pneumothorax.  CXR 11/07/21 - No infiltrate, effusion or edema  CXR 01/01/22 - No infiltrate, effusion or edema  PFT: 01/17/16 [facility] FVC 4.32 (92%) FEV1 3.36 (93%) Ratio 78  TLC 83% DLCO 77% Interpretation: Mildly reduced DLCO. No obstructive or restrictive lung disease  01/17/22 FVC 3.66 (91%) FEV1 3.03 (103%) Ratio 83  TLC 80% DLCO 85%. No significant +BD Interpretation: Normal PFTs.    Labs: CBC    Component Value Date/Time   WBC 7.1 01/01/2022 0513   RBC 5.23 01/01/2022 0513   HGB 15.7 01/01/2022 0513   HGB 14.5 04/25/2021 0949   HCT 45.4 01/01/2022 0513   HCT 43.1 04/25/2021 0949   PLT 146 (L) 01/01/2022 0513   PLT 137 (L) 04/25/2021 0949   MCV 86.8  01/01/2022 0513   MCV 87 04/25/2021 0949   MCH 30.0 01/01/2022 0513   MCHC 34.6 01/01/2022 0513   RDW 13.2 01/01/2022 0513   RDW 12.8 04/25/2021 0949    LYMPHSABS 1,014 10/05/2021 0859   LYMPHSABS 1.5 02/10/2015 1355   MONOABS 0.4 02/07/2021 0826   EOSABS 120 10/05/2021 0859   EOSABS 0.1 02/10/2015 1355   BASOSABS 31 10/05/2021 0859   BASOSABS 0.0 02/10/2015 1355    Assessment & Plan:   Discussion: 71 year old male with DM2, CAD, HTN, hx CVA, CKD stage III, GERD who presents for follow-up. Improved DOE with intermittent inhaler use. Reviewed PFTs which are normal. Counseled on bronchodilator use if benefit perceived. Patient prefers to hold on steroid inhaler unless needed and willing to use albuterol more frequently  Shortness of breath PFTs normal --Use Albuterol daily and as needed for symptoms --Please contact our office if you wish to continue steroid inhalers  Health Maintenance Immunization History  Administered Date(s) Administered   Pneumococcal Conjugate-13 12/01/2013   Td 03/04/1998, 03/01/2017   Tdap 03/06/2011, 02/28/2017  Declined flu, pneumococcal, covid vaccine  CT Lung Screen - never smoker. Not qualified  No orders of the defined types were placed in this encounter.  No orders of the defined types were placed in this encounter.   Return if symptoms worsen or fail to improve.   I have spent a total time of 30-minutes on the day of the appointment including chart review, data review, collecting history, coordinating care and discussing medical diagnosis and plan with the patient/family. Past medical history, allergies, medications were reviewed. Pertinent imaging, labs and tests included in this note have been reviewed and interpreted independently by me.  Avrey Flanagin Mechele Collin, MD [facility] 01/17/2022 10:54 AM  Office Number [phone]

## 2022-01-21 ENCOUNTER — Ambulatory Visit (INDEPENDENT_AMBULATORY_CARE_PROVIDER_SITE_OTHER): Payer: Medicare Other | Admitting: Nurse Practitioner

## 2022-01-21 ENCOUNTER — Encounter: Payer: Self-pay | Admitting: Nurse Practitioner

## 2022-01-21 VITALS — BP 128/74 | HR 59 | Temp 97.5°F | Ht 68.0 in | Wt 207.0 lb

## 2022-01-21 DIAGNOSIS — E7800 Pure hypercholesterolemia, unspecified: Secondary | ICD-10-CM

## 2022-01-21 DIAGNOSIS — Z794 Long term (current) use of insulin: Secondary | ICD-10-CM | POA: Diagnosis not present

## 2022-01-21 DIAGNOSIS — Z6831 Body mass index (BMI) 31.0-31.9, adult: Secondary | ICD-10-CM

## 2022-01-21 DIAGNOSIS — I10 Essential (primary) hypertension: Secondary | ICD-10-CM | POA: Diagnosis not present

## 2022-01-21 DIAGNOSIS — I209 Angina pectoris, unspecified: Secondary | ICD-10-CM

## 2022-01-21 DIAGNOSIS — E6609 Other obesity due to excess calories: Secondary | ICD-10-CM

## 2022-01-21 DIAGNOSIS — E1165 Type 2 diabetes mellitus with hyperglycemia: Secondary | ICD-10-CM | POA: Diagnosis not present

## 2022-01-21 DIAGNOSIS — G2581 Restless legs syndrome: Secondary | ICD-10-CM | POA: Diagnosis not present

## 2022-01-21 NOTE — Patient Instructions (Signed)
Keep moving.   Meal plan

## 2022-01-21 NOTE — Progress Notes (Signed)
Careteam: Patient Care Team: [name], NP as PCP - General (Geriatric Medicine) [name], MD as Consulting Physician (Cardiology) Van Clines, MD as Consulting Physician (Neurology) [name], [name], OD (Optometry) [name], MD as Consulting Physician (Otolaryngology)  PLACE OF SERVICE:  [facility]  Advanced Directive information Does Patient Have a Medical Advance Directive?: No, Would patient like information on creating a medical advance directive?: Yes (MAU/Ambulatory/Procedural Areas - Information given)  Allergies  Allergen Reactions   Amlodipine Other (See Comments)    Worsening restless legs, dyspnea and leg swelling   Lyrica [Pregabalin] Other (See Comments)    Made patient very lethargic the next morning, very hard to patient to function, move, etc.    Statins Other (See Comments)    Causes Restless Legs, rhabdomyolysis   Ativan [Lorazepam] Other (See Comments)    Markedly increased restless legs   Trulicity [Dulaglutide] Other (See Comments)    GI side effects     Chief Complaint  Patient presents with   Medical Management of Chronic Issues    Routine follow up     HPI: Patient is a 71 y.o. male for routine follow up  Went to the ED in October for spasm in his legs- he has been trying to get off primiproxle- suspect having withdrawal symptoms and not taking as much for his restless legs.  Leg movement is bad. Can not stand in one spot. Shakes violently, hard to sleep has to stay moving but then starts cramping.  He is able to get busy to distract himself   DM- being managed through endocrine.   CAD_ no chest pains- followed by cardiology.   Saw pulmonary due to shortness of breath- PFT was normal and told him to use albuterol routinely. Has had not an episode of shortness of breath since he was doing rehab. Completed cardiac rehab.   CAD- no chest pains.     Review of Systems:  Review of Systems  Constitutional:  Negative for  chills, fever and weight loss.  HENT:  Negative for tinnitus.   Respiratory:  Negative for cough, sputum production and shortness of breath.   Cardiovascular:  Negative for chest pain, palpitations and leg swelling.  Gastrointestinal:  Negative for abdominal pain, constipation, diarrhea and heartburn.  Genitourinary:  Negative for dysuria, frequency and urgency.  Musculoskeletal:  Negative for back pain, falls, joint pain and myalgias.  Skin: Negative.   Neurological:  Negative for dizziness and headaches.  Psychiatric/Behavioral:  Negative for depression and memory loss. The patient does not have insomnia.     Past Medical History:  Diagnosis Date   Arthritis    "left thumb" (05/23/2017) right shoulder   Barrett's esophagus    "we've been told that it's all gone; still take RX for GERD" (05/23/2017)   Bilateral swelling of feet and ankles x 3 weeks as of 12-01-2019   CAD (coronary artery disease), native coronary artery    Coronary angiogram 05/03/2014:  Proximal LAD 3.0x12 mm Promus premier stent.  04/08/2014: Mid Cx 3.5 x 16 mm Promus DES.  03/29/2013: Mid LAD 2.75 x 38 mm Promus Premier drug-eluting stent, balloon angioplasty of D1 and distal LAD and stenting of distal RCA with 2.75 x 24 mm promos Premier drug-eluting stent 12/15/2012 widely patent.   Cervicalgia    Chronic bronchitis (HCC)    "q yr" (05/23/2017)   Complication of anesthesia    " I do not wake up very well "  slow to wake up  COVID-19 05/2019   all covid symptoms in hospital x 5 days, all symptoms resolved took monoclonal antibody tx    Dyspnea    with exertion   Family history of adverse reaction to anesthesia    "son w/PONV"   GERD (gastroesophageal reflux disease)    Heart murmur    "grew out of it"    Hyperlipidemia    Hypertension    Hypoglycemia, unspecified    IDDM (insulin dependent diabetes mellitus)    type 2   Impotence of organic origin    Iron deficiency anemia    Memory loss    mild   Other  malaise and fatigue    Pneumonia 05/2019   PONV (postoperative nausea and vomiting)    after wisdom teeth pulled, no problems with other surgeries   Restless leg    Rotator cuff arthropathy    Stroke ([place])    Tension headache    "sometimes" (05/23/2017)   Unspecified gastritis and gastroduodenitis with hemorrhage    Unstable angina pectoris ([place]) 04/08/2014   Coronary angiogram 05/03/2014:  Proximal LAD 3.0x12 mm Promus premier stent.  04/08/2014: Mid Cx 3.5 x 16 mm Promus DES.  03/29/2013: Mid LAD 2.75 x 38 mm Promus Premier drug-eluting stent, balloon angioplasty of D1 and distal LAD and stenting of distal RCA with 2.75 x 24 mm promos Premier drug-eluting stent 12/15/2012 widely patent.   Past Surgical History:  Procedure Laterality Date   BICEPT TENODESIS Right 12/07/2019   Procedure: RIGHT BICEPS TENODESIS;  Surgeon: Renette Butters, MD;  Location: [facility];  Service: Orthopedics;  Laterality: Right;   CARDIAC CATHETERIZATION N/A 09/25/2015   Procedure: Left Heart Cath and Coronary Angiography;  Surgeon: Adrian Prows, MD;  Location: [place] [facility];  Service: Cardiovascular;  Laterality: N/A;   CARDIAC CATHETERIZATION N/A 09/25/2015   Procedure: Intravascular Pressure Wire/FFR Study;  Surgeon: Adrian Prows, MD;  Location: [place] [facility];  Service: Cardiovascular;  Laterality: N/A;   CORONARY ANGIOPLASTY WITH STENT PLACEMENT  12/15/2012; 04/28/2014; 05/03/2014   "2; 1; 1" , 4 stents 2 balloons   CORONARY STENT INTERVENTION N/A 05/01/2021   Procedure: CORONARY STENT INTERVENTION;  Surgeon: Adrian Prows, MD;  Location: [place] [facility];  Service: Cardiovascular;  Laterality: N/A;   FRACTIONAL FLOW RESERVE WIRE Right 03/26/2013   Procedure: FRACTIONAL FLOW RESERVE WIRE;  Surgeon: Laverda Page, MD;  Location: [facility] CATH LAB;  Service: Cardiovascular;  Laterality: Right;   FRACTIONAL FLOW RESERVE WIRE N/A 05/03/2014   Procedure: FRACTIONAL FLOW RESERVE WIRE;  Surgeon:  Laverda Page, MD;  Location: [facility] CATH LAB;  Service: Cardiovascular;  Laterality: N/A;   HERNIA REPAIR  [id number]   "umbilical"    INCISION AND DRAINAGE ABSCESS Left 05/2007   "groin"    KNEE SURGERY Right 1992;  2000   "calcium deposits removed" (12/15/2012)   LEFT HEART CATH AND CORONARY ANGIOGRAPHY N/A 05/23/2017   Procedure: LEFT HEART CATH AND CORONARY ANGIOGRAPHY;  Surgeon: Adrian Prows, MD;  Location: [place] [facility];  Service: Cardiovascular;  Laterality: N/A;   LEFT HEART CATH AND CORONARY ANGIOGRAPHY N/A 05/01/2021   Procedure: LEFT HEART CATH AND CORONARY ANGIOGRAPHY;  Surgeon: Adrian Prows, MD;  Location: [place] [facility];  Service: Cardiovascular;  Laterality: N/A;   LEFT HEART CATHETERIZATION WITH CORONARY ANGIOGRAM N/A 12/15/2012   Procedure: LEFT HEART CATHETERIZATION WITH CORONARY ANGIOGRAM;  Surgeon: Laverda Page, MD;  Location: [facility] CATH LAB;  Service: Cardiovascular;  Laterality: N/A;  LEFT HEART CATHETERIZATION WITH CORONARY ANGIOGRAM N/A 03/26/2013   Procedure: LEFT HEART CATHETERIZATION WITH CORONARY ANGIOGRAM;  Surgeon: Laverda Page, MD;  Location: [facility] CATH LAB;  Service: Cardiovascular;  Laterality: N/A;   LEFT HEART CATHETERIZATION WITH CORONARY ANGIOGRAM N/A 04/08/2014   Procedure: LEFT HEART CATHETERIZATION WITH CORONARY ANGIOGRAM;  Surgeon: Blane Ohara, MD;  Location: [facility] CATH LAB;  Service: Cardiovascular;  Laterality: N/A;   MOUTH SURGERY Right    PERCUTANEOUS CORONARY STENT INTERVENTION (PCI-S) N/A 05/03/2014   Procedure: PERCUTANEOUS CORONARY STENT INTERVENTION (PCI-S);  Surgeon: Laverda Page, MD;  Location: [facility] CATH LAB;  Service: Cardiovascular;  Laterality: N/A;   RADIOLOGY WITH ANESTHESIA N/A 02/08/2021   Procedure: MRI WITH ANESTHESIA OF ABDOMEN WITH AND WITHOUT CONSTRAST;  Surgeon: Radiologist, Medication, MD;  Location: [place];  Service: Radiology;  Laterality: N/A;   ROTATOR CUFF REPAIR     UMBILICAL HERNIA REPAIR  yrs ago   Social  History:   reports that he has never smoked. He has never used smokeless tobacco. He reports that he does not drink alcohol and does not use drugs.  Family History  Adopted: Yes  Problem Relation Age of Onset   Emphysema Mother     Medications: Patient's Medications  New Prescriptions   No medications on file  Previous Medications   ACETAMINOPHEN (TYLENOL) 500 MG TABLET    Take 500 mg by mouth every 6 (six) hours as needed for mild pain, moderate pain, headache or fever.   ALBUTEROL (VENTOLIN HFA) 108 (90 BASE) MCG/ACT INHALER    TAKE 2 PUFFS BY MOUTH EVERY 6 HOURS AS NEEDED FOR WHEEZE OR SHORTNESS OF BREATH   ASPIRIN EC 81 MG TABLET    Take 1 tablet (81 mg total) by mouth daily. Swallow whole.   BD PEN NEEDLE NANO 2ND GEN 32G X 4 MM MISC    USE AS DIRECTED   BUDESON-GLYCOPYRROL-FORMOTEROL (BREZTRI AEROSPHERE) 160-9-4.8 MCG/ACT AERO    Inhale 2 puffs into the lungs in the morning and at bedtime.   CLOPIDOGREL (PLAVIX) 75 MG TABLET    Take 1 tablet (75 mg total) by mouth daily.   CONTINUOUS BLOOD GLUC RECEIVER (FREESTYLE LIBRE 14 DAY READER) DEVI    Inject 1 Device as directed daily as needed. Use once daily as direct to check blood sugar E11.51   CONTINUOUS BLOOD GLUC SENSOR (FREESTYLE LIBRE 14 DAY SENSOR) MISC    USE TO TEST BLOOD SUGAR THREE TIMES DAILY. E11.51. E11.59   FLUTICASONE (FLONASE) 50 MCG/ACT NASAL SPRAY    Place 1 spray into both nostrils daily as needed for allergies or rhinitis.   GLUCOSE 4 GM CHEWABLE TABLET    Chew 1 tablet (4 g total) by mouth as needed for low blood sugar.   INSULIN ASPART (NOVOLOG FLEXPEN) 100 UNIT/ML FLEXPEN    Inject 25-30 Units into the skin See admin instructions. Inject 30 units subcutaneously in the morning, inject 25 units subcutaneously with lunch & inject 25 units subcutaneously with dinner.   INSULIN GLARGINE (BASAGLAR KWIKPEN) 100 UNIT/ML    Inject 30 Units into the skin at bedtime.   ISOSORBIDE MONONITRATE (IMDUR) 60 MG 24 HR TABLET    TAKE  1 TABLET BY MOUTH EVERY DAY   LIFESCAN FINEPOINT LANCETS MISC    Use to test for blood sugar three times daily dx: E11.51   METOPROLOL SUCCINATE (TOPROL-XL) 25 MG 24 HR TABLET    TAKE 1 TABLET BY MOUTH DAILY. TAKE WITH OR IMMEDIATELY FOLLOWING A MEAL.  NITROGLYCERIN (NITROSTAT) 0.4 MG SL TABLET    PLACE 1 TABLET UNDER THE TONGUE EVERY 5 MINUTES AS NEEDED FOR CHEST PAIN.   ONETOUCH ULTRA TEST STRIP    USE TO TEST FOR BLOOD SUGAR THREE TIMES DAILY DX: E11.51   PANTOPRAZOLE (PROTONIX) 40 MG TABLET    TAKE ONE TABLET BY MOUTH ONCE DAILY FOR STOMACH   PRALUENT 150 MG/ML SOAJ    INJECT 1 PEN INTO THE SKIN EVERY 14 (FOURTEEN) DAYS.   PRAMIPEXOLE (MIRAPEX) 1 MG TABLET    Take 0.5 mg by mouth 2 (two) times daily. 5 pm and 9 pm   SEMAGLUTIDE,0.25 OR 0.5MG /DOS, 2 MG/3ML SOPN    Inject 0.5 Units into the skin once a week.   VALSARTAN (DIOVAN) 160 MG TABLET    TAKE 1 TABLET (160 MG TOTAL) BY MOUTH EVERY EVENING.  Modified Medications   No medications on file  Discontinued Medications   VALSARTAN (DIOVAN) 80 MG TABLET    Take 80 mg by mouth every evening.    Physical Exam:  Vitals:   01/21/22 0830  BP: 128/74  Pulse: (!) 59  Temp: (!) 97.5 F (36.4 C)  TempSrc: Temporal  SpO2: 96%  Weight: 207 lb (93.9 kg)  Height: 5\' 8"  (1.727 m)   Body mass index is 31.47 kg/m. Wt Readings from Last 3 Encounters:  01/21/22 207 lb (93.9 kg)  01/17/22 201 lb 13.3 oz (91.6 kg)  01/02/22 203 lb (92.1 kg)    Physical Exam Constitutional:      General: He is not in acute distress.    Appearance: He is well-developed. He is not diaphoretic.  HENT:     Head: Normocephalic and atraumatic.     Right Ear: External ear normal.     Left Ear: External ear normal.     Mouth/Throat:     Pharynx: No oropharyngeal exudate.  Eyes:     Conjunctiva/sclera: Conjunctivae normal.     Pupils: Pupils are equal, round, and reactive to light.  Cardiovascular:     Rate and Rhythm: Normal rate and regular rhythm.      Heart sounds: Normal heart sounds.  Pulmonary:     Effort: Pulmonary effort is normal.     Breath sounds: Normal breath sounds.  Abdominal:     General: Bowel sounds are normal.     Palpations: Abdomen is soft.  Musculoskeletal:        General: No tenderness.     Cervical back: Normal range of motion and neck supple.     Right lower leg: No edema.     Left lower leg: No edema.  Skin:    General: Skin is warm and dry.  Neurological:     Mental Status: He is alert and oriented to person, place, and time.  Psychiatric:        Mood and Affect: Mood normal.     Labs reviewed: Basic Metabolic Panel: Recent Labs    09/03/21 0826 10/05/21 0859 10/22/21 1543 01/01/22 0513 01/01/22 0936  NA  --  137 136 139  --   K  --  4.8 4.8 4.1  --   CL  --  102 101 105  --   CO2  --  27 26 24   --   GLUCOSE  --  237* 193* 89  --   BUN  --  19 28* 18  --   CREATININE  --  1.23 1.38 1.28*  --   CALCIUM  --  9.4 9.2  9.3  --   MG 2.6*  --   --   --  2.1   Liver Function Tests: Recent Labs    02/10/21 1026 02/11/21 1137 02/21/21 0912 10/05/21 0859 10/22/21 1543  AST 52* 53* 22 22 24   ALT 151* 129* 37 28 30  ALKPHOS 87 101  --   --  54  BILITOT 1.8* 1.4* 0.6 0.5 0.6  PROT 6.4* 6.7 6.3 6.4 6.6  ALBUMIN 3.2* 3.5  --   --  4.1   Recent Labs    02/06/21 1430 02/07/21 1430  LIPASE 24 26  AMYLASE  --  36   No results for input(s): "AMMONIA" in the last 8760 hours. CBC: Recent Labs    02/07/21 0826 02/08/21 0225 02/21/21 0912 04/25/21 0949 10/05/21 0859 10/22/21 1543 01/01/22 0513  WBC 9.2   < > 5.8   < > 5.2 6.0 7.1  NEUTROABS 8.4*  --  4,014  --  3,557  --   --   HGB 14.3   < > 13.3   < > 13.0* 13.3 15.7  HCT 42.3   < > 39.3   < > 39.4 40.3 45.4  MCV 88.9   < > 87.9   < > 86.6 85.6 86.8  PLT 102*   < > 182   < > 133* 110.0* 146*   < > = values in this interval not displayed.   Lipid Panel: No results for input(s): "CHOL", "HDL", "LDLCALC", "TRIG", "CHOLHDL",  "LDLDIRECT" in the last 8760 hours. TSH: No results for input(s): "TSH" in the last 8760 hours. A1C: Lab Results  Component Value Date   HGBA1C 8.1 (H) 10/05/2021     Assessment/Plan 1. Type 2 diabetes mellitus with hyperglycemia, with long-term current use of insulin (HCC) -continues to follow up with endocrine for medication management.  -Encouraged dietary compliance, routine foot care/monitoring and to keep up with diabetic eye exams through ophthalmology   2. Restless legs syndrome (RLS) Ongoing, symptoms at night mostly, continues on pramipexole titration.   3. Pure hypercholesterolemia -dietary modifications encouraged, unable to take statins due to hx of rhabdomyolysis  - Lipid panel  4. Essential hypertension -Blood pressure well controlled, goal bp <140/90 Continue current medications and dietary modifications follow metabolic panel  5. Class 1 obesity due to excess calories with serious comorbidity and body mass index (BMI) of 31.0 to 31.9 in adult --education provided on healthy weight loss through increase in physical activity and proper nutrition   Return in about 6 months (around 07/22/2022) for routine follow up . [name]. [place] [phone]

## 2022-01-22 LAB — LIPID PANEL
Cholesterol: 150 mg/dL (ref <200)
HDL: 38 mg/dL — ABNORMAL LOW (ref >=40)
LDL Cholesterol (Calc): 85 mg/dL (calc)
Non-HDL Cholesterol (Calc): 112 mg/dL (calc) (ref <130)
Total CHOL/HDL Ratio: 3.9 (calc) (ref <5.0)
Triglycerides: 175 mg/dL — ABNORMAL HIGH (ref <150)

## 2022-01-23 ENCOUNTER — Ambulatory Visit: Payer: Medicare Other | Admitting: Neurology

## 2022-01-28 ENCOUNTER — Telehealth: Payer: Self-pay | Admitting: Neurology

## 2022-01-28 NOTE — Telephone Encounter (Signed)
Patient needs to speak with someone about the medication he is taking pramipexole  one tablet a day and it is breaking it in half to take it, he is off of the gabapentin as well   He states that he is unable to stand still and can't not ride in a car 2 miles  RLS is getting really bad    He uses the [facility] on [address]

## 2022-01-29 ENCOUNTER — Telehealth: Payer: Self-pay | Admitting: Neurology

## 2022-01-29 ENCOUNTER — Encounter: Payer: Self-pay | Admitting: Neurology

## 2022-01-29 NOTE — Telephone Encounter (Signed)
Attempted to call back, but no answer. I left a message asking for a call back. I also sent a MyChart message.   Jacquelyne Balint, MD [facility] Neurology

## 2022-01-29 NOTE — Telephone Encounter (Signed)
Returned patient's call. He has had increased difficulty with standing or sitting. He will shake and move constantly. He is worse with extra movements.  He has cut the pramipexole to 1/2 tablet twice daily, but will take more when he is having severe symptoms, up to 2 mg.   He will take gabapentin when things are particularly bad. He has taken gabapentin 100 mg a couple of times to help. He has tried 400 mg of gabapentin in the past, but was so tired and was not able to function.  He is taking ferrous sulfate and B12 supplements.   I explained that I think his extra movements were the result of too much dopamine (from excess pramipexole). His symptoms currently do not resemble previous RLS symptoms (patient agreed).  I again recommended he cut the amount of pramipexole and instead take gabapentin.  Plan: -Gabapentin 300 mg qhs -Continue to reduce pramipexole  -0.5 mg BID for 1 week then  -0.5 mg qhs -Will likely need to stop pramipexole when able  Patient has scheduled follow up on 03/14/22.  All questions were answered.  Jacquelyne Balint, MD [facility] Neurology

## 2022-01-29 NOTE — Telephone Encounter (Signed)
Returning a call to Dr Loleta Chance  please call back

## 2022-01-30 DIAGNOSIS — M25532 Pain in left wrist: Secondary | ICD-10-CM | POA: Diagnosis not present

## 2022-02-07 ENCOUNTER — Other Ambulatory Visit: Payer: Self-pay

## 2022-02-07 DIAGNOSIS — Z794 Long term (current) use of insulin: Secondary | ICD-10-CM

## 2022-02-07 MED ORDER — NOVOLOG FLEXPEN 100 UNIT/ML ~~LOC~~ SOPN: 25.0000 [IU] | PEN_INJECTOR | SUBCUTANEOUS | 2 refills | Status: DC

## 2022-02-08 ENCOUNTER — Encounter: Payer: Self-pay | Admitting: Cardiology

## 2022-02-08 ENCOUNTER — Ambulatory Visit: Payer: Medicare Other | Admitting: Cardiology

## 2022-02-08 VITALS — BP 134/72 | HR 78 | Ht 68.0 in | Wt 202.2 lb

## 2022-02-08 DIAGNOSIS — I25118 Atherosclerotic heart disease of native coronary artery with other forms of angina pectoris: Secondary | ICD-10-CM | POA: Diagnosis not present

## 2022-02-08 DIAGNOSIS — E7800 Pure hypercholesterolemia, unspecified: Secondary | ICD-10-CM

## 2022-02-08 DIAGNOSIS — E119 Type 2 diabetes mellitus without complications: Secondary | ICD-10-CM

## 2022-02-08 DIAGNOSIS — Z794 Long term (current) use of insulin: Secondary | ICD-10-CM | POA: Diagnosis not present

## 2022-02-08 DIAGNOSIS — I10 Essential (primary) hypertension: Secondary | ICD-10-CM

## 2022-02-08 MED ORDER — SEMAGLUTIDE(0.25 OR 0.5MG/DOS) 2 MG/1.5ML ~~LOC~~ SOPN
0.2500 mg | PEN_INJECTOR | Freq: Once | SUBCUTANEOUS | Status: AC
  Administered 2022-02-08: 0.25 mg via SUBCUTANEOUS

## 2022-02-08 MED ORDER — SEMAGLUTIDE(0.25 OR 0.5MG/DOS) 2 MG/3ML ~~LOC~~ SOPN: 0.5000 [IU] | PEN_INJECTOR | SUBCUTANEOUS | 3 refills | Status: DC

## 2022-02-08 NOTE — Progress Notes (Signed)
Primary Physician/Referring:  [name], NP  Patient ID: Kenneth Sic Sr., male    DOB: January 18, 1951, 71 y.o.   MRN: [id number]  Chief Complaint  Patient presents with   Coronary Artery Disease   Follow-up   HPI:    Kenneth Rhoda Sr.  is a 71 y.o. Caucasian male with coronary artery disease, with stents to his LAD, RCA and circumflex, remote balloon angioplasty to distal LAD and diagonal has remained patent,  hypertension, hyperlipidemia, GERD, Barrett's esophagus, statin intolerance, uncontrolled diabetes mellitus, severe restless leg syndrome.  He also had florid COVID 19 infection in February 2021.  Patient presents for 2-month follow-up visit.  He has not had any further episodes of chest pain and he has not used any sublingual nitroglycerin.  He did have dyspnea and underwent cardiac rehab with improvement in dyspnea.  Overall he is feeling well, he is also weaning himself off of restless leg syndrome medication and has noticed improvement in his restless leg syndrome.  Past Medical History:  Diagnosis Date   Arthritis    "left thumb" (05/23/2017) right shoulder   Barrett's esophagus    "we've been told that it's all gone; still take RX for GERD" (05/23/2017)   Bilateral swelling of feet and ankles x 3 weeks as of 12-01-2019   CAD (coronary artery disease), native coronary artery    Coronary angiogram 05/03/2014:  Proximal LAD 3.0x12 mm Promus premier stent.  04/08/2014: Mid Cx 3.5 x 16 mm Promus DES.  03/29/2013: Mid LAD 2.75 x 38 mm Promus Premier drug-eluting stent, balloon angioplasty of D1 and distal LAD and stenting of distal RCA with 2.75 x 24 mm promos Premier drug-eluting stent 12/15/2012 widely patent.   Cervicalgia    Chronic bronchitis (HCC)    "q yr" (05/23/2017)   Complication of anesthesia    " I do not wake up very well "  slow to wake up   COVID-19 05/2019   all covid symptoms in hospital x 5 days, all symptoms resolved took monoclonal antibody tx    Dyspnea    with  exertion   Family history of adverse reaction to anesthesia    "son w/PONV"   GERD (gastroesophageal reflux disease)    Heart murmur    "grew out of it"    Hyperlipidemia    Hypertension    Hypoglycemia, unspecified    IDDM (insulin dependent diabetes mellitus)    type 2   Impotence of organic origin    Iron deficiency anemia    Memory loss    mild   Other malaise and fatigue    Pneumonia 05/2019   PONV (postoperative nausea and vomiting)    after wisdom teeth pulled, no problems with other surgeries   Restless leg    Rotator cuff arthropathy    Stroke (HCC)    Tension headache    "sometimes" (05/23/2017)   Unspecified gastritis and gastroduodenitis with hemorrhage    Unstable angina pectoris (HCC) 04/08/2014   Coronary angiogram 05/03/2014:  Proximal LAD 3.0x12 mm Promus premier stent.  04/08/2014: Mid Cx 3.5 x 16 mm Promus DES.  03/29/2013: Mid LAD 2.75 x 38 mm Promus Premier drug-eluting stent, balloon angioplasty of D1 and distal LAD and stenting of distal RCA with 2.75 x 24 mm promos Premier drug-eluting stent 12/15/2012 widely patent.   ROS  Review of Systems  Cardiovascular:  Positive for leg swelling (Occasional). Negative for chest pain (resolved), claudication, dyspnea on exertion and palpitations.  Neurological:  Negative for  dizziness.    Objective      02/08/2022    1:02 PM 01/21/2022    8:30 AM 01/17/2022   10:08 AM  Vitals with BMI  Height     Weight 202 lbs 3 oz 207 lbs 201 lbs 13 oz  BMI 30.75 31.48 30.7  Systolic 134 128 161  Diastolic 72 74 76  Pulse 78 59 65    Blood pressure 134/72, pulse 78, height  (1.727 m), weight 202 lb 3.2 oz (91.7 kg), SpO2 97 %. Body mass index is 30.74 kg/m.   Physical Exam Vitals reviewed.  Constitutional:      Appearance: He is well-developed.  Neck:     Vascular: No carotid bruit.  Cardiovascular:     Rate and Rhythm: Normal rate and regular rhythm.     Pulses:          Carotid pulses are 2+ on  the right side and 2+ on the left side.      Femoral pulses are 2+ on the right side and 2+ on the left side.      Dorsalis pedis pulses are 0 on the right side and 0 on the left side.       Posterior tibial pulses are 1+ on the right side and 0 on the left side.     Heart sounds: Murmur heard.     Blowing decrescendo early diastolic murmur is present at the upper right sternal border radiating to the apex.     No gallop.     Comments: No JVD. Pulmonary:     Effort: Pulmonary effort is normal.     Breath sounds: Normal breath sounds.  Abdominal:     General: Bowel sounds are normal.     Palpations: Abdomen is soft.  Musculoskeletal:     Right lower leg: Edema (1+) present.     Left lower leg: Edema (1+) present.    Laboratory examination:   Recent Labs    02/10/21 1026 02/11/21 1137 02/21/21 0912 10/05/21 0859 10/22/21 1543 01/01/22 0513  NA 132* 133*   < > 137 136 139  K 4.2 4.5   < > 4.8 4.8 4.1  CL 100 100   < > 102 101 105  CO2 24 24   < > GLUCOSE 215* 260*   < > 237* 193* 89  BUN 19 17   < > 19 28* 18  CREATININE 1.29* 1.02   < > 1.23 1.38 1.28*  CALCIUM 9.0 9.1   < > 9.4 9.2 9.3  GFRNONAA 59* >60  --   --   --  59*   < > = values in this interval not displayed.      Latest Ref Rng & Units 01/01/2022    5:13 AM 10/22/2021    3:43 PM 10/05/2021    8:59 AM  CMP  Glucose 70 - 99 mg/dL 89  096  045   BUN 8 - 23 mg/dL Creatinine 0.61 - 1.24 mg/dL 4.09  8.11  9.14   Sodium 135 - 145 mmol/L 139  136  137   Potassium 3.5 - 5.1 mmol/L 4.1  4.8  4.8   Chloride 98 - 111 mmol/L 105  101  102   CO2 22 - 32 mmol/L Calcium 8.9 - 10.3 mg/dL 9.3  9.2  9.4   Total Protein 6.0 -  8.3 g/dL  6.6  6.4   Total Bilirubin 0.2 - 1.2 mg/dL  0.6  0.5   Alkaline Phos 39 - 117 U/L  54    AST 0 - 37 U/L  24  22   ALT 0 - 53 U/L  30  28       Latest Ref Rng & Units 01/01/2022    5:13 AM 10/22/2021    3:43 PM 10/05/2021    8:59 AM  CBC  WBC 4.0 -  10.5 K/uL 7.1  6.0  5.2   Hemoglobin 13.0 - 17.0 g/dL 12.4  58.0  99.8   Hematocrit 39.0 - 52.0 % 45.4  40.3  39.4   Platelets 150 - 400 K/uL 146  110.0  133    Lipid Panel Recent Labs    01/21/22 0857  CHOL 150  TRIG 175*  LDLCALC 85  HDL 38*  CHOLHDL 3.9    HEMOGLOBIN A1C Lab Results  Component Value Date   HGBA1C 8.1 (H) 10/05/2021   MPG 186 10/05/2021   TSH No results for input(s): "TSH" in the last 8760 hours.   BNP (last 3 results) Recent Labs    07/06/21 1504  BNP 51.4    Allergies   Allergies  Allergen Reactions   Amlodipine Other (See Comments)    Worsening restless legs, dyspnea and leg swelling   Lyrica [Pregabalin] Other (See Comments)    Made patient very lethargic the next morning, very hard to patient to function, move, etc.    Statins Other (See Comments)    Causes Restless Legs, rhabdomyolysis   Ativan [Lorazepam] Other (See Comments)    Markedly increased restless legs   Trulicity [Dulaglutide] Other (See Comments)    GI side effects      Final Medications at End of Visit    Current Outpatient Medications:    acetaminophen (TYLENOL) 500 MG tablet, Take 500 mg by mouth every 6 (six) hours as needed for mild pain, moderate pain, headache or fever., Disp: , Rfl:    albuterol (VENTOLIN HFA) 108 (90 Base) MCG/ACT inhaler, TAKE 2 PUFFS BY MOUTH EVERY 6 HOURS AS NEEDED FOR WHEEZE OR SHORTNESS OF BREATH, Disp: 6.7 each, Rfl: 2   aspirin EC 81 MG tablet, Take 1 tablet (81 mg total) by mouth daily. Swallow whole., Disp: 90 tablet, Rfl: 3   Budeson-Glycopyrrol-Formoterol (BREZTRI AEROSPHERE) 160-9-4.8 MCG/ACT AERO, Inhale 2 puffs into the lungs in the morning and at bedtime., Disp: 5.9 g, Rfl: 0   clopidogrel (PLAVIX) 75 MG tablet, Take 1 tablet (75 mg total) by mouth daily., Disp: 90 tablet, Rfl: 3   Continuous Blood Gluc Receiver (FREESTYLE LIBRE 14 DAY READER) DEVI, Inject 1 Device as directed daily as needed. Use once daily as direct to check blood  sugar E11.51, Disp: 1 each, Rfl: 0   Continuous Blood Gluc Sensor (FREESTYLE LIBRE 14 DAY SENSOR) MISC, USE TO TEST BLOOD SUGAR THREE TIMES DAILY. E11.51. E11.59, Disp: 1 each, Rfl: 12   fluticasone (FLONASE) 50 MCG/ACT nasal spray, Place 1 spray into both nostrils daily as needed for allergies or rhinitis., Disp: 16 g, Rfl: 11   glucose 4 GM chewable tablet, Chew 1 tablet (4 g total) by mouth as needed for low blood sugar., Disp: 50 tablet, Rfl: 12   insulin aspart (NOVOLOG FLEXPEN) 100 UNIT/ML FlexPen, Inject 25-30 Units into the skin See admin instructions. Inject 30 units subcutaneously in the morning, inject 25 units subcutaneously with lunch & inject 25 units subcutaneously with  dinner., Disp: 30 mL, Rfl: 2   Insulin Glargine (BASAGLAR KWIKPEN) 100 UNIT/ML, Inject 30 Units into the skin at bedtime., Disp: 30 mL, Rfl: 3   isosorbide mononitrate (IMDUR) 60 MG 24 hr tablet, TAKE 1 TABLET BY MOUTH EVERY DAY, Disp: 90 tablet, Rfl: 1   metoprolol succinate (TOPROL-XL) 25 MG 24 hr tablet, TAKE 1 TABLET BY MOUTH DAILY. TAKE WITH OR IMMEDIATELY FOLLOWING A MEAL., Disp: 90 tablet, Rfl: 1   nitroGLYCERIN (NITROSTAT) 0.4 MG SL tablet, PLACE 1 TABLET UNDER THE TONGUE EVERY 5 MINUTES AS NEEDED FOR CHEST PAIN., Disp: 50 tablet, Rfl: 0   ONETOUCH ULTRA test strip, USE TO TEST FOR BLOOD SUGAR THREE TIMES DAILY DX: E11.51, Disp: 300 strip, Rfl: 11   pantoprazole (PROTONIX) 40 MG tablet, TAKE ONE TABLET BY MOUTH ONCE DAILY FOR STOMACH, Disp: 90 tablet, Rfl: 3   PRALUENT 150 MG/ML SOAJ, INJECT 1 PEN INTO THE SKIN EVERY 14 (FOURTEEN) DAYS., Disp: 6 mL, Rfl: 1   valsartan (DIOVAN) 160 MG tablet, TAKE 1 TABLET (160 MG TOTAL) BY MOUTH EVERY EVENING., Disp: 90 tablet, Rfl: 3   BD PEN NEEDLE NANO 2ND GEN 32G X 4 MM MISC, USE AS DIRECTED, Disp: 100 each, Rfl: 3   LIFESCAN FINEPOINT LANCETS MISC, Use to test for blood sugar three times daily dx: E11.51, Disp: 300 each, Rfl: 1   Semaglutide,0.25 or 0.5MG /DOS, 2 MG/3ML SOPN,  Inject 0.5 Units into the skin once a week., Disp: 9 mL, Rfl: 3 No current facility-administered medications for this visit.  Facility-Administered Medications Ordered in Other Visits:    [COMPLETED] hydrALAZINE (APRESOLINE) injection 10 mg, 10 mg, Intravenous, Once, Yates DecampGanji, Manami Tutor, MD, 10 mg at 05/01/21 1054   Radiology   No results found.   Cardiac Studies:   Lower Extremity Arterial Duplex 06/22/2020: No hemodynamically significant stenoses are identified in the right or left lower extremity arterial system. Mildly  abnormal biphasic waveform noted throughout the lower extremity. The right and left mid posterior tibial and mid anterior tibial arteries are non-compressible due to medial arteriosclerosis.   PCV ECHOCARDIOGRAM COMPLETE 01/04/2021 Normal LV systolic function with visual EF 60-65%. Left ventricle cavity is normal in size. Mild left ventricular hypertrophy. Normal global wall motion. Normal diastolic filling pattern, normal LAP. Mild (Grade I) aortic regurgitation. Mild tricuspid regurgitation. No evidence of pulmonary hypertension. Compared to study 06/22/2020 MR is now resolved otherwise no significant change.   Left Heart Catheterization 05/01/21:  LV 135/8, EDP 18 mmHg.  Ao 142/72, mean 104 mmHg.  No pressure gradient across the aortic valve. Left main: Not present LAD: Moderate to large vessel in the proximal segment however it is diffusely diseased.  Proximal 3.0 x 12 mm Promus DES placed 05/03/2014 widely patent.  Mid LAD 2.75 x 38 mm Promus Premier DES, balloon angioplasty of D1 and distal LAD performed 03/29/2013 is widely patent. CX: Large vessel, ostial circumflex 30 to 40% stenosis unchanged from prior catheterization, a 3.5 x 16 mm Promus DES placed 04/08/2014 is widely patent. RCA: Large vessel, mid segment has a diffuse long segment 99% stenosis.  Previously placed 2.74 x 24 mm Promus Premier DES on 12/15/2012 is widely patent.   Interventional data: Successful PTCA  and stenting of the mid and distal right coronary artery with 2 overlapping 2.5 x 38 mm and a 2.5 x 18 mm Onyx frontier DES, distal end of the stent overlapped 2.75 x 24 mm Promus Premier DES placed 12/15/2012.  TIMI II to TIMI-3 flow at the end  of the procedure.    04/08/2014: 3.5 x 16 mm Promus DES  05/03/2014:  Proximal 3.0 x 12 mm Promus DES placed 05/03/2014.  Mid LAD 2.75 x 38 mm Promus Premier DES  05/01/2021: 2.5 x 38 mm and a 2.5 x 18 mm Onyx frontier DES, distal end of the stent overlapped to 2.75 x 24 mm Promus Premier DES placed 12/15/2012   PCV CARDIAC STRESS TEST 09/07/2021  Narrative Exercise treadmill stress test 09/07/2021: Exercise treadmill stress test performed using Bruce protocol.  Patient reached 7.7 METS, and 86% of age predicted maximum heart rate. Exercise capacity was fair. Normal heart rate response. Resting BP 130/82 mmHg, with hypertensive response, peak BP 240/80 mmHg.  No chest pain reported.  Dyspnea reported. Stress EKG revealed no ischemic changes. Low risk study.   EKG   EKG 02/08/2022: Normal sinus rhythm at the rate of 71 bpm, left axis deviation, left anterior fascicular block.  Poor R progression, LVH.  Baseline artifact, nonspecific T abnormality.  Compared to 08/06/2021, baseline artifact new.  Otherwise no change.   Assessment     ICD-10-CM   1. Coronary artery disease of native artery of native heart with stable angina pectoris (HCC)  I25.118 EKG 12-Lead    Semaglutide,0.25 or 0.5MG /DOS, 2 MG/3ML SOPN    Semaglutide(0.25 or 0.5MG /DOS) SOPN 0.25 mg    2. Essential hypertension  I10     3. Pure hypercholesterolemia  E78.00     4. Diabetes mellitus type 2, insulin dependent (HCC)  E11.9 Semaglutide,0.25 or 0.5MG /DOS, 2 MG/3ML SOPN   Z79.4      Meds ordered this encounter  Medications   Semaglutide,0.25 or 0.5MG /DOS, 2 MG/3ML SOPN    Sig: Inject 0.5 Units into the skin once a week.    Dispense:  9 mL    Refill:  3   Semaglutide(0.25 or  0.5MG /DOS) SOPN 0.25 mg   Administrations This Visit     Semaglutide(0.25 or 0.5MG /DOS) SOPN 0.25 mg     Admin Date 02/08/2022 Action Given Dose 0.25 mg Route Subcutaneous Administered By Erby Pian, CMA            Administration Action Time Recorded Time Documented By Site Comment Reason Patient Supplied  Given : 0.25 mg :   : Subcutaneous 02/08/22 1645 02/08/22 1647 Erby Pian, CMA Right Lower Abdomen NDC : [phone] Lot #: [id number] Exp: 09/01/2023  No    Medications Discontinued During This Encounter  Medication Reason   pramipexole (MIRAPEX) 1 MG tablet Completed Course   Semaglutide,0.25 or 0.5MG /DOS, 2 MG/3ML SOPN Reorder    Recommendations:   Kenneth Sic Sr.  is a 71 y.o. Caucasian male with coronary artery disease, with stents to his LAD, RCA and circumflex, remote balloon angioplasty to distal LAD and diagonal has remained patent,  hypertension, hyperlipidemia, GERD, Barrett's esophagus, statin intolerance, uncontrolled diabetes mellitus, severe restless leg syndrome.  He also had florid COVID 19 infection in February 2021.  1. Coronary artery disease of native artery of native heart with stable angina pectoris [facility]) Patient has not had any further episodes of chest pain.  Continue present medical management.  2. Essential hypertension Blood pressure is very well-controlled, no changes were done today.  Labs reviewed, renal function has remained stable.  3. Pure hypercholesterolemia Lipids are well-controlled, elevated triglycerides related to uncontrolled diabetes mellitus.  He is presently on Repatha.  4. Diabetes mellitus type 2, insulin dependent (HCC) He was prescribed Ozempic however he never picked up the prescription due to cost.  We discussed extensively regarding possibility of weight loss and improvement in blood sugar control with Ozempic and also cardiovascular protection.  Sample given to the patient and we started him on 0.25 mg today and  clear-cut instructions given.  I have discussed with him to reduce his intake of food to avoid nausea and also to have small meals.  I would like to see him back in 6 weeks to see how they affect has been.  Otherwise stable from cardiac standpoint.   [name], PA-C 02/08/2022, 6:35 PM Office: [phone]

## 2022-02-28 ENCOUNTER — Encounter: Payer: Self-pay | Admitting: Internal Medicine

## 2022-02-28 ENCOUNTER — Ambulatory Visit (INDEPENDENT_AMBULATORY_CARE_PROVIDER_SITE_OTHER): Payer: Medicare Other | Admitting: Internal Medicine

## 2022-02-28 VITALS — BP 112/80 | HR 73 | Ht 68.0 in | Wt 202.6 lb

## 2022-02-28 DIAGNOSIS — I209 Angina pectoris, unspecified: Secondary | ICD-10-CM | POA: Diagnosis not present

## 2022-02-28 DIAGNOSIS — E785 Hyperlipidemia, unspecified: Secondary | ICD-10-CM | POA: Diagnosis not present

## 2022-02-28 DIAGNOSIS — E1159 Type 2 diabetes mellitus with other circulatory complications: Secondary | ICD-10-CM | POA: Diagnosis not present

## 2022-02-28 DIAGNOSIS — E1165 Type 2 diabetes mellitus with hyperglycemia: Secondary | ICD-10-CM | POA: Diagnosis not present

## 2022-02-28 LAB — POCT GLYCOSYLATED HEMOGLOBIN (HGB A1C): Hemoglobin A1C: 7.8 % — AB (ref 4.0–5.6)

## 2022-02-28 NOTE — Patient Instructions (Signed)
Please continue: - Basaglar 30 units daily - Novolog 10-15 units 15 min before the meals  Please increase: - Ozempic 0.5 mg weekly  Please return in 3-4 months.

## 2022-02-28 NOTE — Progress Notes (Signed)
Patient ID: Kenneth Peter Sr., male   DOB: 02/08/1951, 71 y.o.   MRN: [id number]  HPI: Kenneth Belt Sr. is a 71 y.o.-year-old male, returning for follow-up for DM2, dx in 2016, insulin-dependent  since 2020, uncontrolled, with complications (CAD, history of CVA, CKD stage III). Pt. previously saw Dr. Loanne Drilling, but last visit with me 4 months ago.  Interim history: Before last OV, he met with a nutritionist >> changed diet.  No increased urination, blurry vision, nausea, chest pain. He lost 5 lbs since last OV.  Reviewed HbA1c: Lab Results  Component Value Date   HGBA1C 8.1 (H) 10/05/2021   HGBA1C 8.3 (A) 05/07/2021   HGBA1C 7.9 (H) 01/15/2021   HGBA1C 8.1 (A) 12/25/2020   HGBA1C 8.6 (A) 09/14/2020   HGBA1C 8.7 (H) 06/30/2020   HGBA1C 8.6 (H) 02/22/2020   HGBA1C 8.8 (H) 10/18/2019   HGBA1C 7.6 (H) 06/14/2019   HGBA1C 9.2 (H) 03/09/2019   At last visit, he was on: - Basaglar 30 units at bedtime - Novolog 30-25-25 units (3x a day), immediately before meals: 10-20 units before B 15 units before L 15-20 unit before D He had nausea with metformin - stopped 2 mo ago. He tried Trulicity >> nausea.  We changed to: - Basaglar 35 >> 30 units at bedtime - Novolog: 15 minutes before meals: 10-20 units before B 10-15 units before L 15-20 unit before D - Ozempic 0.25 mg weekly - started 2 weeks ago  Pt checks his sugars >4 a day with his CGM:  Previously:  Lowest sugar was 46; he has hypoglycemia awareness at 70.  Highest sugar was 400.  Glucometer: One Touch ultra  - + CKD, last BUN/creatinine:  Lab Results  Component Value Date   BUN 18 01/01/2022   BUN 28 (H) 10/22/2021   CREATININE 1.28 (H) 01/01/2022   CREATININE 1.38 10/22/2021  On Diovan 80 mg daily.  -+HL; last set of lipids: Lab Results  Component Value Date   CHOL 150 01/21/2022   HDL 38 (L) 01/21/2022   LDLCALC 85 01/21/2022   LDLDIRECT 110 (H) 06/17/2019   TRIG 175 (H) 01/21/2022   CHOLHDL 3.9 01/21/2022  He  is on Praluent.  He had side effects from statins in the past.  - last eye exam was 10/04/2021. No DR.   - +  restless leg syndrome.  He is on Neurontin. Also, on pramipexole for restless leg syndrome.  He sees neurology.  Last foot exam 10/22/2021.  He also has a history of HTN, GERD, Barrett's esophagus, iron deficiency anemia, memory loss.  ROS: + see HPI  Past Medical History:  Diagnosis Date   Arthritis    "left thumb" (05/23/2017) right shoulder   Barrett's esophagus    "we've been told that it's all gone; still take RX for GERD" (05/23/2017)   Bilateral swelling of feet and ankles x 3 weeks as of 12-01-2019   CAD (coronary artery disease), native coronary artery    Coronary angiogram 05/03/2014:  Proximal LAD 3.0x12 mm Promus premier stent.  04/08/2014: Mid Cx 3.5 x 16 mm Promus DES.  03/29/2013: Mid LAD 2.75 x 38 mm Promus Premier drug-eluting stent, balloon angioplasty of D1 and distal LAD and stenting of distal RCA with 2.75 x 24 mm promos Premier drug-eluting stent 12/15/2012 widely patent.   Cervicalgia    Chronic bronchitis ([place])    "q yr" (1/32/4401)   Complication of anesthesia    " I do not wake up very well "  slow to wake up   COVID-19 05/2019   all covid symptoms in hospital x 5 days, all symptoms resolved took monoclonal antibody tx    Dyspnea    with exertion   Family history of adverse reaction to anesthesia    "son w/PONV"   GERD (gastroesophageal reflux disease)    Heart murmur    "grew out of it"    Hyperlipidemia    Hypertension    Hypoglycemia, unspecified    IDDM (insulin dependent diabetes mellitus)    type 2   Impotence of organic origin    Iron deficiency anemia    Memory loss    mild   Other malaise and fatigue    Pneumonia 05/2019   PONV (postoperative nausea and vomiting)    after wisdom teeth pulled, no problems with other surgeries   Restless leg    Rotator cuff arthropathy    Stroke ([place])    Tension headache    "sometimes" (05/23/2017)    Unspecified gastritis and gastroduodenitis with hemorrhage    Unstable angina pectoris ([place]) 04/08/2014   Coronary angiogram 05/03/2014:  Proximal LAD 3.0x12 mm Promus premier stent.  04/08/2014: Mid Cx 3.5 x 16 mm Promus DES.  03/29/2013: Mid LAD 2.75 x 38 mm Promus Premier drug-eluting stent, balloon angioplasty of D1 and distal LAD and stenting of distal RCA with 2.75 x 24 mm promos Premier drug-eluting stent 12/15/2012 widely patent.   Past Surgical History:  Procedure Laterality Date   BICEPT TENODESIS Right 12/07/2019   Procedure: RIGHT BICEPS TENODESIS;  Surgeon: Renette Butters, MD;  Location: [facility];  Service: Orthopedics;  Laterality: Right;   CARDIAC CATHETERIZATION N/A 09/25/2015   Procedure: Left Heart Cath and Coronary Angiography;  Surgeon: Adrian Prows, MD;  Location: [facility];  Service: Cardiovascular;  Laterality: N/A;   CARDIAC CATHETERIZATION N/A 09/25/2015   Procedure: Intravascular Pressure Wire/FFR Study;  Surgeon: Adrian Prows, MD;  Location: [place] [facility];  Service: Cardiovascular;  Laterality: N/A;   CORONARY ANGIOPLASTY WITH STENT PLACEMENT  12/15/2012; 04/28/2014; 05/03/2014   "2; 1; 1" , 4 stents 2 balloons   CORONARY STENT INTERVENTION N/A 05/01/2021   Procedure: CORONARY STENT INTERVENTION;  Surgeon: Adrian Prows, MD;  Location: [place] [facility];  Service: Cardiovascular;  Laterality: N/A;   FRACTIONAL FLOW RESERVE WIRE Right 03/26/2013   Procedure: FRACTIONAL FLOW RESERVE WIRE;  Surgeon: Laverda Page, MD;  Location: [facility] CATH LAB;  Service: Cardiovascular;  Laterality: Right;   FRACTIONAL FLOW RESERVE WIRE N/A 05/03/2014   Procedure: FRACTIONAL FLOW RESERVE WIRE;  Surgeon: Laverda Page, MD;  Location: [facility] CATH LAB;  Service: Cardiovascular;  Laterality: N/A;   HERNIA REPAIR  4585   "umbilical"    INCISION AND DRAINAGE ABSCESS Left 05/2007   "groin"    KNEE SURGERY Right 1992;  2000   "calcium deposits removed" (12/15/2012)   LEFT  HEART CATH AND CORONARY ANGIOGRAPHY N/A 05/23/2017   Procedure: LEFT HEART CATH AND CORONARY ANGIOGRAPHY;  Surgeon: Adrian Prows, MD;  Location: [facility];  Service: Cardiovascular;  Laterality: N/A;   LEFT HEART CATH AND CORONARY ANGIOGRAPHY N/A 05/01/2021   Procedure: LEFT HEART CATH AND CORONARY ANGIOGRAPHY;  Surgeon: Adrian Prows, MD;  Location: [place] [facility];  Service: Cardiovascular;  Laterality: N/A;   LEFT HEART CATHETERIZATION WITH CORONARY ANGIOGRAM N/A 12/15/2012   Procedure: LEFT HEART CATHETERIZATION WITH CORONARY ANGIOGRAM;  Surgeon: Laverda Page, MD;  Location: [facility] CATH LAB;  Service: Cardiovascular;  Laterality: N/A;   LEFT HEART CATHETERIZATION WITH CORONARY ANGIOGRAM N/A 03/26/2013   Procedure: LEFT HEART CATHETERIZATION WITH CORONARY ANGIOGRAM;  Surgeon: Laverda Page, MD;  Location: [facility] CATH LAB;  Service: Cardiovascular;  Laterality: N/A;   LEFT HEART CATHETERIZATION WITH CORONARY ANGIOGRAM N/A 04/08/2014   Procedure: LEFT HEART CATHETERIZATION WITH CORONARY ANGIOGRAM;  Surgeon: Blane Ohara, MD;  Location: [facility] CATH LAB;  Service: Cardiovascular;  Laterality: N/A;   MOUTH SURGERY Right    PERCUTANEOUS CORONARY STENT INTERVENTION (PCI-S) N/A 05/03/2014   Procedure: PERCUTANEOUS CORONARY STENT INTERVENTION (PCI-S);  Surgeon: Laverda Page, MD;  Location: [facility] CATH LAB;  Service: Cardiovascular;  Laterality: N/A;   RADIOLOGY WITH ANESTHESIA N/A 02/08/2021   Procedure: MRI WITH ANESTHESIA OF ABDOMEN WITH AND WITHOUT CONSTRAST;  Surgeon: Radiologist, Medication, MD;  Location: [facility];  Service: Radiology;  Laterality: N/A;   ROTATOR CUFF REPAIR  [id number]   UMBILICAL HERNIA REPAIR  yrs ago   Social History   Socioeconomic History   Marital status: Married    Spouse name: Pam   Number of children: 2   Years of education: 12   Highest education level: High school graduate  Occupational History   Occupation: Administrator    Comment: deliveries only now    Occupation: woodworking  Tobacco Use   Smoking status: Never   Smokeless tobacco: Never  Vaping Use   Vaping Use: Never used  Substance and Sexual Activity   Alcohol use: No   Drug use: No   Sexual activity: Not Currently  Other Topics Concern   Not on file  Social History Narrative   Right handed    Caffeine 2 cups daily   Winter time 4 cups daily   Lives family one level home    Semi Retired   Investment banker, operational of Health   Financial Resource Strain: [place]  (01/18/2021)   Overall Financial Resource Strain (CARDIA)    Difficulty of Paying Living Expenses: Not very hard  Food Insecurity: No Food Insecurity (01/18/2021)   Hunger Vital Sign    Worried About Running Out of Food in the Last Year: Never true    [place] in the Last Year: Never true  Transportation Needs: No Transportation Needs (01/18/2021)   PRAPARE - Hydrologist (Medical): No    Lack of Transportation (Non-Medical): No  Physical Activity: Inactive (04/25/2017)   Exercise Vital Sign    Days of Exercise per Week: 0 days    Minutes of Exercise per Session: 0 min  Stress: No Stress Concern Present (04/25/2017)   [place]    Feeling of Stress : Only a little  Social Connections: Moderately Integrated (04/25/2017)   Social Connection and Isolation Panel [NHANES]    Frequency of Communication with Friends and Family: More than three times a week    Frequency of Social Gatherings with Friends and Family: More than three times a week    Attends Religious Services: More than 4 times per year    Active Member of [employer] or Organizations: No    Attends Archivist Meetings: Never    Marital Status: Married  Human resources officer Violence: Not At Risk (04/25/2017)   Humiliation, Afraid, Rape, and Kick questionnaire    Fear of Current or Ex-Partner: No    Emotionally Abused: No    Physically Abused: No     Sexually Abused: No  Current Outpatient Medications on File Prior to Visit  Medication Sig Dispense Refill   acetaminophen (TYLENOL) 500 MG tablet Take 500 mg by mouth every 6 (six) hours as needed for mild pain, moderate pain, headache or fever.     albuterol (VENTOLIN HFA) 108 (90 Base) MCG/ACT inhaler TAKE 2 PUFFS BY MOUTH EVERY 6 HOURS AS NEEDED FOR WHEEZE OR SHORTNESS OF BREATH 6.7 each 2   aspirin EC 81 MG tablet Take 1 tablet (81 mg total) by mouth daily. Swallow whole. 90 tablet 3   BD PEN NEEDLE NANO 2ND GEN 32G X 4 MM MISC USE AS DIRECTED 100 each 3   Budeson-Glycopyrrol-Formoterol (BREZTRI AEROSPHERE) 160-9-4.8 MCG/ACT AERO Inhale 2 puffs into the lungs in the morning and at bedtime. 5.9 g 0   clopidogrel (PLAVIX) 75 MG tablet Take 1 tablet (75 mg total) by mouth daily. 90 tablet 3   Continuous Blood Gluc Receiver (FREESTYLE LIBRE 14 DAY READER) DEVI Inject 1 Device as directed daily as needed. Use once daily as direct to check blood sugar E11.51 1 each 0   Continuous Blood Gluc Sensor (FREESTYLE LIBRE 14 DAY SENSOR) MISC USE TO TEST BLOOD SUGAR THREE TIMES DAILY. E11.51. E11.59 1 each 12   fluticasone (FLONASE) 50 MCG/ACT nasal spray Place 1 spray into both nostrils daily as needed for allergies or rhinitis. 16 g 11   glucose 4 GM chewable tablet Chew 1 tablet (4 g total) by mouth as needed for low blood sugar. 50 tablet 12   insulin aspart (NOVOLOG FLEXPEN) 100 UNIT/ML FlexPen Inject 25-30 Units into the skin See admin instructions. Inject 30 units subcutaneously in the morning, inject 25 units subcutaneously with lunch & inject 25 units subcutaneously with dinner. 30 mL 2   Insulin Glargine (BASAGLAR KWIKPEN) 100 UNIT/ML Inject 30 Units into the skin at bedtime. 30 mL 3   isosorbide mononitrate (IMDUR) 60 MG 24 hr tablet TAKE 1 TABLET BY MOUTH EVERY DAY 90 tablet 1   LIFESCAN FINEPOINT LANCETS MISC Use to test for blood sugar three times daily dx: E11.51 300 each 1   metoprolol  succinate (TOPROL-XL) 25 MG 24 hr tablet TAKE 1 TABLET BY MOUTH DAILY. TAKE WITH OR IMMEDIATELY FOLLOWING A MEAL. 90 tablet 1   nitroGLYCERIN (NITROSTAT) 0.4 MG SL tablet PLACE 1 TABLET UNDER THE TONGUE EVERY 5 MINUTES AS NEEDED FOR CHEST PAIN. 50 tablet 0   ONETOUCH ULTRA test strip USE TO TEST FOR BLOOD SUGAR THREE TIMES DAILY DX: E11.51 300 strip 11   pantoprazole (PROTONIX) 40 MG tablet TAKE ONE TABLET BY MOUTH ONCE DAILY FOR STOMACH 90 tablet 3   PRALUENT 150 MG/ML SOAJ INJECT 1 PEN INTO THE SKIN EVERY 14 (FOURTEEN) DAYS. 6 mL 1   Semaglutide,0.25 or 0.5MG/DOS, 2 MG/3ML SOPN Inject 0.5 Units into the skin once a week. 9 mL 3   valsartan (DIOVAN) 160 MG tablet TAKE 1 TABLET (160 MG TOTAL) BY MOUTH EVERY EVENING. 90 tablet 3   Current Facility-Administered Medications on File Prior to Visit  Medication Dose Route Frequency Provider Last Rate Last Admin   [COMPLETED] hydrALAZINE (APRESOLINE) injection 10 mg  10 mg Intravenous Once Adrian Prows, MD   10 mg at 05/01/21 1054   Allergies  Allergen Reactions   Amlodipine Other (See Comments)    Worsening restless legs, dyspnea and leg swelling   Lyrica [Pregabalin] Other (See Comments)    Made patient very lethargic the next morning, very hard to patient to function, move, etc.  Statins Other (See Comments)    Causes Restless Legs, rhabdomyolysis   Ativan [Lorazepam] Other (See Comments)    Markedly increased restless legs   Trulicity [Dulaglutide] Other (See Comments)    GI side effects    Family History  Adopted: Yes  Problem Relation Age of Onset   Emphysema Mother    PE: BP 112/80 (BP Location: Left Arm, Patient Position: Sitting, Cuff Size: Normal)   Pulse 73   Ht _0  (1.727 m)   Wt 202 lb 9.6 oz (91.9 kg)   SpO2 97%   BMI 30.81 kg/m  Wt Readings from Last 3 Encounters:  02/28/22 202 lb 9.6 oz (91.9 kg)  02/08/22 202 lb 3.2 oz (91.7 kg)  01/21/22 207 lb (93.9 kg)   Constitutional: overweight, in NAD Eyes: no  exophthalmos ENT: no thyromegaly, no cervical lymphadenopathy, + mild nonpitting B LE edema Cardiovascular: RRR, No MRG Respiratory: CTA B Musculoskeletal: no deformities Skin:  no rashes Neurological: no tremor with outstretched hands  ASSESSMENT: 1. DM2, insulin-dependent, uncontrolled, with complications - CAD - h/o CVA - CKD stage III - severe hypoglycemia in 2019  2. HL  PLAN:  1. Patient with longstanding, uncontrolled, type 2 diabetes, on insulin regimen to which we added a weekly GLP-1 receptor at last visit, due to still poor control.  At that time, HbA1c was slightly lower, at 8.1%, but still above goal.  Reviewing his CGM trends at that time, sugars were quite high, usually above target at most times of the day except for late afternoon and evening, until 9 PM.  Upon questioning, he was taking his NovoLog incorrectly, at the start of the meal rather than 15 minutes before.  I advised him to move the doses 15 to 20 minutes before meals, with a longer lag time if the sugars were high.  We also discussed about not to overcorrect low or high blood sugars and to always check blood sugars with glucometer, rather than CGM, when correcting these.  I also suggested to change to [place] U200 but he had a larger supply of Basaglar at home so we did not switch at last visit. -He was able to start Ozempic since last visit.  He had nausea with Trulicity in the past, but he tolerates Ozempic well. CGM interpretation: -At today's visit, we reviewed his CGM downloads: It appears that 62% of values are in target range (goal >70%), while 37% are higher than 180 (goal <25%), and 1% are lower than 70 (goal <4%).  The calculated average blood sugar is 163.  The projected HbA1c for the next 3 months (GMI) is 7.2%. -Reviewing the CGM trends, Let's are mostly fluctuating within the target range with hyperglycemic spikes after breakfast, lunch, and dinner.  Upon questioning, he is trying to take a low-dose of  NovoLog before the meal and then takes an extra dose when the sugars are increasing after the meal.  We discussed that ideally he would take the entire NovoLog for the meal 15 minutes before he starts eating.  I explained why this is important.  He agrees to start doing so.  To avoid the risk of hypoglycemia, especially since he did have some lower blood sugars, I advised him to reduce his NovoLog doses.  Since he is still on the lower dose of Ozempic and did not increase it yet (since he just started 2 weeks ago), I advised him to increase the dose to 0.5 mg weekly.  He agrees to do so.  -  I suggested to:  Patient Instructions  Please continue: - Basaglar 30 units daily - Novolog 10-15 units 15 min before the meals  Please increase: - Ozempic 0.5 mg weekly  Please return in 3-4 months.  - we checked his HbA1c: 7.8% (lower) - advised to check sugars at different times of the day - 4x a day, rotating check times - advised for yearly eye exams >> he is UTD - return to clinic in 3-4 months   2. HL -Reviewed latest lipid panel from 01/2022: LDL above target, triglycerides also slightly high, HDL low: Lab Results  Component Value Date   CHOL 150 01/21/2022   HDL 38 (L) 01/21/2022   LDLCALC 85 01/21/2022   LDLDIRECT 110 (H) 06/17/2019   TRIG 175 (H) 01/21/2022   CHOLHDL 3.9 01/21/2022  -He continues on Praluent without side effects; he had side effects from statins: Restless legs and rhabdomyolysis.  Philemon Kingdom, MD PhD [facility]

## 2022-03-02 ENCOUNTER — Emergency Department (HOSPITAL_BASED_OUTPATIENT_CLINIC_OR_DEPARTMENT_OTHER)
Admission: EM | Admit: 2022-03-02 | Discharge: 2022-03-02 | Disposition: A | Discharge status: Home / Self Care | Payer: Medicare Other | Attending: Emergency Medicine | Admitting: Emergency Medicine

## 2022-03-02 ENCOUNTER — Encounter (HOSPITAL_BASED_OUTPATIENT_CLINIC_OR_DEPARTMENT_OTHER): Payer: Self-pay | Admitting: Emergency Medicine

## 2022-03-02 DIAGNOSIS — G253 Myoclonus: Secondary | ICD-10-CM

## 2022-03-02 DIAGNOSIS — G2581 Restless legs syndrome: Secondary | ICD-10-CM | POA: Insufficient documentation

## 2022-03-02 DIAGNOSIS — Z7982 Long term (current) use of aspirin: Secondary | ICD-10-CM | POA: Insufficient documentation

## 2022-03-02 DIAGNOSIS — Z794 Long term (current) use of insulin: Secondary | ICD-10-CM | POA: Diagnosis not present

## 2022-03-02 DIAGNOSIS — Z7902 Long term (current) use of antithrombotics/antiplatelets: Secondary | ICD-10-CM | POA: Insufficient documentation

## 2022-03-02 LAB — PHOSPHORUS: Phosphorus: 4.3 mg/dL (ref 2.5–4.6)

## 2022-03-02 LAB — BASIC METABOLIC PANEL
Anion gap: 6 (ref 5–15)
BUN: 18 mg/dL (ref 8–23)
CO2: 30 mmol/L (ref 22–32)
Calcium: 10 mg/dL (ref 8.9–10.3)
Chloride: 103 mmol/L (ref 98–111)
Creatinine, Ser: 1.29 mg/dL — ABNORMAL HIGH (ref 0.61–1.24)
GFR, Estimated: 59 mL/min — ABNORMAL LOW (ref >60)
Glucose, Bld: 88 mg/dL (ref 70–99)
Potassium: 4.1 mmol/L (ref 3.5–5.1)
Sodium: 139 mmol/L (ref 135–145)

## 2022-03-02 LAB — MAGNESIUM: Magnesium: 2.2 mg/dL (ref 1.7–2.4)

## 2022-03-02 LAB — CBC
HCT: 45.6 % (ref 39.0–52.0)
Hemoglobin: 15.7 g/dL (ref 13.0–17.0)
MCH: 30.7 pg (ref 26.0–34.0)
MCHC: 34.4 g/dL (ref 30.0–36.0)
MCV: 89.1 fL (ref 80.0–100.0)
Platelets: 125 10*3/uL — ABNORMAL LOW (ref 150–400)
RBC: 5.12 MIL/uL (ref 4.22–5.81)
RDW: 13 % (ref 11.5–15.5)
WBC: 7.7 10*3/uL (ref 4.0–10.5)
nRBC: 0 % (ref 0.0–0.2)

## 2022-03-02 MED ORDER — OXYCODONE-ACETAMINOPHEN 5-325 MG PO TABS
2.0000 | ORAL_TABLET | Freq: Once | ORAL | Status: AC
  Administered 2022-03-02: 2 via ORAL
  Filled 2022-03-02: qty 2

## 2022-03-02 MED ORDER — OXYCODONE-ACETAMINOPHEN 5-325 MG PO TABS: 1.0000 | ORAL_TABLET | Freq: Three times a day (TID) | ORAL | 0 refills | Status: DC | PRN

## 2022-03-02 NOTE — Discharge Instructions (Signed)
1.  Your lab work was normal this evening. 2.  The cause of your symptoms is unclear.  You have had multiple specialist evaluations.  Continue to work with your doctor and your specialist. 3.  You have been given a small prescription for Percocet to help with sleep and discomfort.  Let your doctor know that you got this prescription, and discuss ongoing management of your symptoms.

## 2022-03-02 NOTE — ED Notes (Signed)
Pt's CBG monitor reads 68mg /dl - given juice. MD notified.

## 2022-03-02 NOTE — ED Notes (Signed)
Discharge instructions discussed with pt. Pt verbalized understanding. Pt stable and ambulatory.  °

## 2022-03-02 NOTE — ED Triage Notes (Signed)
Pt here with c/o restless leg , got worse last night , takes Neurontin 1900mg  in the last 8 hours without minimal relief

## 2022-03-02 NOTE — ED Provider Notes (Signed)
MEDCENTER [facility] EMERGENCY DEPT Provider Note   CSN: [id number] Arrival date & time: 03/02/22  1339     History  No chief complaint on file.   Kenneth Corigliano Sr. is a 71 y.o. male.  HPI Patient has had a fairly longstanding problem with twitching muscles and a diagnosis of restless legs.  However this also involves twitching of the torso and restlessness.  Patient reports is very hard for him to sleep sometimes he has to get up and keep moving around.  His legs will be jumping.  This has been evaluated by several specialist.  There is not a specific underlying etiology known.  Patient reports periodically it gets much worse and he cannot even sleep at night.  He has not been ill recently, no fevers no vomiting no diarrhea.  He has been taking his prescribed medications but his pramipexole had been decreased to half based on adverse effects.  He continues to take fairly high doses of Neurontin.    Home Medications Prior to Admission medications   Medication Sig Start Date End Date Taking? Authorizing Provider  oxyCODONE-acetaminophen (PERCOCET) 5-325 MG tablet Take 1-2 tablets by mouth every 8 (eight) hours as needed for moderate pain. 03/02/22  Yes Arby Barrette, MD  acetaminophen (TYLENOL) 500 MG tablet Take 500 mg by mouth every 6 (six) hours as needed for mild pain, moderate pain, headache or fever.    [provider]  albuterol (VENTOLIN HFA) 108 (90 Base) MCG/ACT inhaler TAKE 2 PUFFS BY MOUTH EVERY 6 HOURS AS NEEDED FOR WHEEZE OR SHORTNESS OF BREATH 01/07/22   [name], NP  aspirin EC 81 MG tablet Take 1 tablet (81 mg total) by mouth daily. Swallow whole. 04/11/21   Cantwell, Celeste C, PA-C  BD PEN NEEDLE NANO 2ND GEN 32G X 4 MM MISC USE AS DIRECTED 04/12/20   Reed, Tiffany L, DO  Budeson-Glycopyrrol-Formoterol (BREZTRI AEROSPHERE) 160-9-4.8 MCG/ACT AERO Inhale 2 puffs into the lungs in the morning and at bedtime. 11/07/21   Luciano Cutter, MD  clopidogrel  (PLAVIX) 75 MG tablet Take 1 tablet (75 mg total) by mouth daily. 04/11/21   Cantwell, Celeste C, PA-C  Continuous Blood Gluc Receiver (FREESTYLE LIBRE 14 DAY READER) DEVI Inject 1 Device as directed daily as needed. Use once daily as direct to check blood sugar E11.51 05/03/19   Reed, Tiffany L, DO  Continuous Blood Gluc Sensor (FREESTYLE LIBRE 14 DAY SENSOR) MISC USE TO TEST BLOOD SUGAR THREE TIMES DAILY. E11.51. E11.59 08/15/21   [name], NP  fluticasone (FLONASE) 50 MCG/ACT nasal spray Place 1 spray into both nostrils daily as needed for allergies or rhinitis. 10/11/21   [name], NP  glucose 4 GM chewable tablet Chew 1 tablet (4 g total) by mouth as needed for low blood sugar. 07/28/20   Fargo, Amy E, NP  insulin aspart (NOVOLOG FLEXPEN) 100 UNIT/ML FlexPen Inject 25-30 Units into the skin See admin instructions. Inject 30 units subcutaneously in the morning, inject 25 units subcutaneously with lunch & inject 25 units subcutaneously with dinner. 02/07/22   Carlus Pavlov, MD  Insulin Glargine [facility] KWIKPEN) 100 UNIT/ML Inject 30 Units into the skin at bedtime. 05/31/21   Romero Belling, MD  isosorbide mononitrate (IMDUR) 60 MG 24 hr tablet TAKE 1 TABLET BY MOUTH EVERY DAY 07/09/21   Cantwell, Longton C, PA-C  LIFESCAN FINEPOINT LANCETS MISC Use to test for blood sugar three times daily dx: E11.51 06/01/18   [name], NP  metoprolol succinate (TOPROL-XL) 25 MG 24 hr tablet TAKE 1 TABLET BY MOUTH DAILY. TAKE WITH OR IMMEDIATELY FOLLOWING A MEAL. 12/20/21   [name], NP  nitroGLYCERIN (NITROSTAT) 0.4 MG SL tablet PLACE 1 TABLET UNDER THE TONGUE EVERY 5 MINUTES AS NEEDED FOR CHEST PAIN. 04/26/21   [name], NP  [facility] ULTRA test strip USE TO TEST FOR BLOOD SUGAR THREE TIMES DAILY DX: E11.51 12/04/18   Reed, Tiffany L, DO  pantoprazole (PROTONIX) 40 MG tablet TAKE ONE TABLET BY MOUTH ONCE DAILY FOR STOMACH 09/24/21   [name], NP  PRALUENT 150 MG/ML  SOAJ INJECT 1 PEN INTO THE SKIN EVERY 14 (FOURTEEN) DAYS. 12/31/21   [name], MD  Semaglutide,0.25 or 0.5MG /DOS, 2 MG/3ML SOPN Inject 0.5 Units into the skin once a week. 02/08/22   [name], MD  valsartan (DIOVAN) 160 MG tablet TAKE 1 TABLET (160 MG TOTAL) BY MOUTH EVERY EVENING. 11/28/21   [name], MD      Allergies    Amlodipine, Lyrica [pregabalin], Statins, Ativan [lorazepam], and Trulicity [dulaglutide]    Review of Systems   Review of Systems  Physical Exam Updated Vital Signs BP 137/72   Pulse 62   Temp 97.9 F (36.6 C) (Oral)   Resp 16   SpO2 95%  Physical Exam Constitutional:      Comments: Patient is alert and nontoxic.  Mental status is clear.  He sitting up at the edge of stretcher.  He is having repeat motions of both legs having a small to medium amplitude of rotating to one side and then back to midline.  HENT:     Mouth/Throat:     Mouth: Mucous membranes are moist.     Pharynx: Oropharynx is clear.  Eyes:     Extraocular Movements: Extraocular movements intact.     Pupils: Pupils are equal, round, and reactive to light.  Cardiovascular:     Rate and Rhythm: Normal rate and regular rhythm.  Pulmonary:     Effort: Pulmonary effort is normal.     Breath sounds: Normal breath sounds.  Abdominal:     General: There is no distension.     Palpations: Abdomen is soft.     Tenderness: There is no abdominal tenderness. There is no guarding.  Musculoskeletal:     Comments: Lower extremities do not have any significant peripheral edema.  Calves are soft and nontender.  Skin:    General: Skin is warm and dry.  Neurological:     Comments: Mental status is clear.  Speech is clear.  Patient is oriented and has good recall.  His movements reflect a spasmodic jerk mostly of the legs.  In a seated position he rather briskly moves the legs to one side and then back to midline.  He is doing this periodically.  He also then gets up and stands, in a standing position he  will have legs that are somewhat trembling and balancing.  No difficulty walking.  Patient's gait is coordinated without signs of weakness or instability.  He has been able to walk about the department without difficulty.  His pacing seems to diminish spasmodic motions.  Psychiatric:     Comments: Patient's mood is calm and interactive.     ED Results / Procedures / Treatments   Labs (all labs ordered are listed, but only abnormal results are displayed) Labs Reviewed  BASIC METABOLIC PANEL - Abnormal; Notable for the following components:      Result Value  Creatinine, Ser 1.29 (*)    GFR, Estimated 59 (*)    All other components within normal limits  CBC - Abnormal; Notable for the following components:   Platelets 125 (*)    All other components within normal limits  MAGNESIUM  PHOSPHORUS    EKG None  Radiology No results found.  Procedures Procedures    Medications Ordered in ED Medications  oxyCODONE-acetaminophen (PERCOCET/ROXICET) 5-325 MG per tablet 2 tablet (2 tablets Oral Given 03/02/22 1849)    ED Course/ Medical Decision Making/ A&P                           Medical Decision Making Amount and/or Complexity of Data Reviewed Labs: ordered.  Risk Prescription drug management.   Patient presents as outlined.  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  He reports he has a diagnosis of restless legs but that his cardiologist dubbed it "restless body".  This jerking motion is not limited to his legs and not limited to nighttime.  He describes episodes that wax and wane in severity without any particular trigger.  Patient is clinically well.  At this time will check metabolic panel, magnesium and phosphorus as well as basic blood count to rule out metabolic derangement.  Patient does not have specific neurologic deficits.  He can perform all necessary actions.  He can transition from sitting to standing and ambulate.  The frequency  and amplitude of his motions seem to correlate with certain activities.  With distraction and focused activities, spasms seem to extinguish.  Patient reports has had significantly adverse reaction to Ativan.  He has tolerated narcotic medications for sedation and to help with sleep when he is having severe episodes.  Patient was given Percocet p.o.  He is reassessed and well in appearance.  He is up and ambulating with no signs of incoordination.  Symptoms seem improved.  He exhibits no somnolence.  Will provide a short course of Percocet for home use at bedtime.  Patient is counseled he must continue to work closely with his PCP and neurologist regarding this ongoing chronic problem with waxing and waning severity of episodes.        Final Clinical Impression(s) / ED Diagnoses Final diagnoses:  Restless legs  Myoclonic jerking    Rx / DC Orders ED Discharge Orders          Ordered    oxyCODONE-acetaminophen (PERCOCET) 5-325 MG tablet  Every 8 hours PRN        03/02/22 2027              Arby Barrette, MD 03/02/22 2038

## 2022-03-04 DIAGNOSIS — G2581 Restless legs syndrome: Secondary | ICD-10-CM | POA: Diagnosis not present

## 2022-03-04 DIAGNOSIS — Z87891 Personal history of nicotine dependence: Secondary | ICD-10-CM | POA: Diagnosis not present

## 2022-03-04 DIAGNOSIS — J449 Chronic obstructive pulmonary disease, unspecified: Secondary | ICD-10-CM | POA: Diagnosis not present

## 2022-03-04 DIAGNOSIS — K219 Gastro-esophageal reflux disease without esophagitis: Secondary | ICD-10-CM | POA: Diagnosis not present

## 2022-03-04 DIAGNOSIS — I10 Essential (primary) hypertension: Secondary | ICD-10-CM | POA: Diagnosis not present

## 2022-03-04 DIAGNOSIS — G249 Dystonia, unspecified: Secondary | ICD-10-CM | POA: Diagnosis not present

## 2022-03-04 DIAGNOSIS — Z8673 Personal history of transient ischemic attack (TIA), and cerebral infarction without residual deficits: Secondary | ICD-10-CM | POA: Diagnosis not present

## 2022-03-04 DIAGNOSIS — E119 Type 2 diabetes mellitus without complications: Secondary | ICD-10-CM | POA: Diagnosis not present

## 2022-03-04 DIAGNOSIS — G4700 Insomnia, unspecified: Secondary | ICD-10-CM | POA: Diagnosis not present

## 2022-03-04 DIAGNOSIS — M1990 Unspecified osteoarthritis, unspecified site: Secondary | ICD-10-CM | POA: Diagnosis not present

## 2022-03-04 DIAGNOSIS — I2510 Atherosclerotic heart disease of native coronary artery without angina pectoris: Secondary | ICD-10-CM | POA: Diagnosis not present

## 2022-03-04 DIAGNOSIS — E785 Hyperlipidemia, unspecified: Secondary | ICD-10-CM | POA: Diagnosis not present

## 2022-03-04 DIAGNOSIS — M79604 Pain in right leg: Secondary | ICD-10-CM | POA: Diagnosis not present

## 2022-03-05 ENCOUNTER — Emergency Department (HOSPITAL_COMMUNITY)
Admission: EM | Admit: 2022-03-05 | Discharge: 2022-03-06 | Discharge status: Against Medical Advice | Payer: Medicare Other | Attending: Emergency Medicine | Admitting: Emergency Medicine

## 2022-03-05 ENCOUNTER — Encounter (HOSPITAL_COMMUNITY): Payer: Self-pay | Admitting: Pharmacy Technician

## 2022-03-05 ENCOUNTER — Telehealth: Payer: Self-pay | Admitting: Anesthesiology

## 2022-03-05 ENCOUNTER — Telehealth: Payer: Self-pay | Admitting: Neurology

## 2022-03-05 DIAGNOSIS — G2581 Restless legs syndrome: Secondary | ICD-10-CM

## 2022-03-05 DIAGNOSIS — G249 Dystonia, unspecified: Secondary | ICD-10-CM | POA: Diagnosis not present

## 2022-03-05 DIAGNOSIS — Z5321 Procedure and treatment not carried out due to patient leaving prior to being seen by health care provider: Secondary | ICD-10-CM | POA: Diagnosis not present

## 2022-03-05 DIAGNOSIS — I10 Essential (primary) hypertension: Secondary | ICD-10-CM | POA: Diagnosis not present

## 2022-03-05 DIAGNOSIS — R252 Cramp and spasm: Secondary | ICD-10-CM | POA: Diagnosis not present

## 2022-03-05 LAB — COMPREHENSIVE METABOLIC PANEL
ALT: 41 U/L (ref 0–44)
AST: 44 U/L — ABNORMAL HIGH (ref 15–41)
Albumin: 4.1 g/dL (ref 3.5–5.0)
Alkaline Phosphatase: 56 U/L (ref 38–126)
Anion gap: 8 (ref 5–15)
BUN: 20 mg/dL (ref 8–23)
CO2: 26 mmol/L (ref 22–32)
Calcium: 9.2 mg/dL (ref 8.9–10.3)
Chloride: 103 mmol/L (ref 98–111)
Creatinine, Ser: 1.25 mg/dL — ABNORMAL HIGH (ref 0.61–1.24)
GFR, Estimated: >60 mL/min (ref >60)
Glucose, Bld: 110 mg/dL — ABNORMAL HIGH (ref 70–99)
Potassium: 4.5 mmol/L (ref 3.5–5.1)
Sodium: 137 mmol/L (ref 135–145)
Total Bilirubin: 0.9 mg/dL (ref 0.3–1.2)
Total Protein: 6.9 g/dL (ref 6.5–8.1)

## 2022-03-05 LAB — CBC WITH DIFFERENTIAL/PLATELET
Abs Immature Granulocytes: 0.02 10*3/uL (ref 0.00–0.07)
Basophils Absolute: 0 10*3/uL (ref 0.0–0.1)
Basophils Relative: 0 %
Eosinophils Absolute: 0.1 10*3/uL (ref 0.0–0.5)
Eosinophils Relative: 2 %
HCT: 43.8 % (ref 39.0–52.0)
Hemoglobin: 14.6 g/dL (ref 13.0–17.0)
Immature Granulocytes: 0 %
Lymphocytes Relative: 17 %
Lymphs Abs: 1.2 10*3/uL (ref 0.7–4.0)
MCH: 30 pg (ref 26.0–34.0)
MCHC: 33.3 g/dL (ref 30.0–36.0)
MCV: 89.9 fL (ref 80.0–100.0)
Monocytes Absolute: 0.7 10*3/uL (ref 0.1–1.0)
Monocytes Relative: 10 %
Neutro Abs: 5.1 10*3/uL (ref 1.7–7.7)
Neutrophils Relative %: 71 %
Platelets: 128 10*3/uL — ABNORMAL LOW (ref 150–400)
RBC: 4.87 MIL/uL (ref 4.22–5.81)
RDW: 12.5 % (ref 11.5–15.5)
WBC: 7.2 10*3/uL (ref 4.0–10.5)
nRBC: 0 % (ref 0.0–0.2)

## 2022-03-05 MED ORDER — HYDROCODONE-ACETAMINOPHEN 5-325 MG PO TABS
2.0000 | ORAL_TABLET | Freq: Once | ORAL | Status: AC
  Administered 2022-03-05: 2 via ORAL
  Filled 2022-03-05: qty 2

## 2022-03-05 NOTE — Addendum Note (Signed)
Addended by: Venetia Night on: 03/05/2022 11:54 AM   Modules accepted: Orders

## 2022-03-05 NOTE — Telephone Encounter (Signed)
Patient seen at [facility] 03/04/22 after seeing [facility] on 03/02/22 for the same C/o. advised to  Stop taking pramipexole! - Continue home iron supplementations as tolerated - Continue Gabapentin, take 300 mg in AM, 300 mg at 12:00 PM (noon), 900 mg at night - They recommend follow up with [facility]. A referral has been placed, and you should be hearing from them. - They recommend improving sleep hygiene with: Setting regular sleep hours, avoiding electronics in bed, practice visualization exercises if he cannot fall asleep, limit liquids prior to sleep, and eliminate all naps.

## 2022-03-05 NOTE — Telephone Encounter (Signed)
Referral added

## 2022-03-05 NOTE — Progress Notes (Unsigned)
NEUROLOGY FOLLOW UP OFFICE NOTE  Doren Kaspar Sr. [id number]  Subjective:  Tayden Nichelson Sr. is a 72 y.o. year old male arthritis, GERD, COPD, CAD, HTN, HLD, DM2, iron deficiency anemia, TIA (01/14/21 - left face droop and blurry vision) who we last saw on 01/02/22.  To briefly review: Patient started having restless legs syndrome. He describes needing to move his legs, then would improve after moving. It would happen worse at night and while in bed. He went untreated for many years. After his first stent in 2014, patient was put on Lipitor. He had such severe shaking, he required hospitalization and narcotics to stop his restless legs. He mentions having problems with his iron. He has taken iron supplementation and iron shots. He was not currently on any iron supplementation at initial visit on 10/22/21.   He has taken many different medications in the past, but is currently on gabapentin 200 mg at bedtime and pramipexole 1mg  at 5 pm, 1mg  at 7 pm, and 2mg  at bedtime (9-10 pm). Previously he did not take pramipexole later, but now requires it earlier and requires more medication. Symptoms continue to worsen though. He cannot sit down long or symptoms will get severe. He feels like he has to move to keep the symptoms at bay. His symptoms have spread throughout the body now.   Patient was previously seen for restless leg syndrome (in 2015) by Dr. . To briefly review, per history from clinic note on 09/29/13: "was admitted at [facility] last 09/18/2013 for leg pain due to severe RLS, found to have rhabdomyolysis with CK of 4907.  He was admitted for hydration and pain management.  He has had RLS for at least 20 years, initially affecting his legs, however since 2004 his arms have been involved as well.  He has been taking Mirapex for at least 10 years, and had been takin 0.75mg  dose 1-2 tablets at night.  This dose was causing him drowsiness.  He saw his PCP on 07/14 due to worsening RLS symptoms. He was tried  on Zanaflex and Mirapex was discontinued, which did not help.  He restarted Mirapex and had to take additional doses due to constant limb movements and pain, prompting him to go to the ER.  He was started on Gabapentin and clonazepam which did not help much, he was also given Dilaudid which quieted him down.  He was discharged home then returned to the ER due to persistently restless and painful legs bilaterally. He was described to be literally thrashing about the bed, able to answer questions but in significant distress. He tells me the symptoms would start in his right leg, leg would flex at the hip, then he stretches and leans back because the leg is jerking so violently for up to 1-1/2 hours. This eases off then starts on his left leg.  Walking and moving around helps.  Symptoms improved with pain medication. He saw his PCP a week ago and reported leg swelling, ?due to gabapentin. He was started on Sinemet 10/100mg  TID, which her reports has helped with the RLS symptoms, however wears off in the middle of the night. He would take 1/2 tab Mirapex when Sinemet wears off.  Since starting Sinemet, he reports weird dreams but no other side effects.  He had discontinued gabapentin, clonazepam, and Zanaflex.     He has infrequent headaches, dyspnea on exertion (climbing steps), occasional choking. Otherwise he denies any diplopia, dysarthria, focal numbness/tingling, bowel/bladder dysfunction.  He has a history  of C1 fracture at age 1 or 67. No known family history of similar RLS because he was adopted."   Patient has previously tried Mirapex and Sinemet as well as gabapentin, clonazepam, zanaflex, and Tramadol.   He endorses occasional discomfort in his legs and swelling of his legs.   He denies fevers, chills, or unexplained weight lost.   EtOH: none  Dietary restrictions: none Family hx of neurologic disorders: patient is adopted and not sure  At 01/02/22 visit, patient had dyskinesias thought to be  due to excess of pramipexole as he continued to take 3 mg nightly. He was instructed to decrease pramipexole. He described increasing movements continuing on 01/29/22 at which time patient was still taking up to 2 mg of pramipexole nightly. We again agreed to cut down on the pramipexole and take gabapentin instead of RLS symptoms.  Most recent Plan (01/29/22 telephone note): Plan: -Gabapentin 300 mg qhs -Continue to reduce pramipexole             -0.5 mg BID for 1 week then             -0.5 mg qhs -Will likely need to stop pramipexole when able  Since their last visit: Patient continues to have severe abnormal movements. He went to [facility] ED on 03/02/22, [facility] ED on 03/04/22, and [facility] again on 03/05/22 for the movements.  [facility] ED assessment/plan: Mr. Rayner endorses a chronic longstanding history of restless leg syndrome which per history appears consistent with, also presents with years of abnormal uncontrollable movements that are worse at rest and specifically at nighttime. On history it appears the symptoms started sometime after being treated for restless leg syndrome with pramipexole. On exam there is no significant bradykinesia rigidity or postural action tremor. Abnormal movements involving his trunk and lower extremities are observed consistent and concerning for dyskinesia. Overall believe his presentation is consistent with likely a chronic history of restless leg syndrome and a secondary process of dyskinesia in the setting of chronic pramipexole use. Further workup and recommendations as below. - Stop taking pramipexole! - Continue home iron supplementations as tolerated - Continue Gabapentin, take 300 mg in AM, 300 mg at 12:00 PM (noon), 900 mg at night - They recommend follow up with [facility]. A referral has been placed, and you should be hearing from them.  - They recommend improving sleep hygiene with: Setting regular sleep hours, avoiding electronics in bed, practice  visualization exercises if he cannot fall asleep, limit liquids prior to sleep, and eliminate all naps.   Patient says he can set his watch to his symptoms. He knows when he sits down to eat in the evening, he is going to be shaking. He will take medication then. After his meal, his symptoms will get worse. He will take more medication. He will get worse throughout the night. He will have to keep moving, either on a treadmill or in his shop. This is help with symptoms. In the past, he would get drowsy then finally go to sleep, but not sleep all night. Wife mentions that these jerking movements have been present for years, but maybe worse now (since his 84s per wife). It has gotten worse over the last few months since our treatment plan began.   Wife provided a video from ED last night in which patient had intermittent quick jerking of all extremities, including in the head and neck. It was not rhythmic and without clear pattern. After getting permission from patient in  writing (will be scanned into chart), I recorded the recording and patient's jerks today with office iPad.  Current medications: -Stopped pramipexole on 03/04/22. He had previously been on 0.5 mg for 2-3 weeks -Gabapentin 300 mg at 5pm, 300 mg at 7pm, 300 mg at 9pm, and maybe 300 mg more late at night (he has had at most 1800 mg in a day)  MEDICATIONS:  Outpatient Encounter Medications as of 03/06/2022  Medication Sig   acetaminophen (TYLENOL) 500 MG tablet Take 500 mg by mouth every 6 (six) hours as needed for mild pain, moderate pain, headache or fever.   albuterol (VENTOLIN HFA) 108 (90 Base) MCG/ACT inhaler TAKE 2 PUFFS BY MOUTH EVERY 6 HOURS AS NEEDED FOR WHEEZE OR SHORTNESS OF BREATH   aspirin EC 81 MG tablet Take 1 tablet (81 mg total) by mouth daily. Swallow whole.   BD PEN NEEDLE NANO 2ND GEN 32G X 4 MM MISC USE AS DIRECTED   Budeson-Glycopyrrol-Formoterol (BREZTRI AEROSPHERE) 160-9-4.8 MCG/ACT AERO Inhale 2 puffs into the lungs  in the morning and at bedtime.   clopidogrel (PLAVIX) 75 MG tablet Take 1 tablet (75 mg total) by mouth daily.   Continuous Blood Gluc Receiver (FREESTYLE LIBRE 14 DAY READER) DEVI Inject 1 Device as directed daily as needed. Use once daily as direct to check blood sugar E11.51   Continuous Blood Gluc Sensor (FREESTYLE LIBRE 14 DAY SENSOR) MISC USE TO TEST BLOOD SUGAR THREE TIMES DAILY. E11.51. E11.59   fluticasone (FLONASE) 50 MCG/ACT nasal spray Place 1 spray into both nostrils daily as needed for allergies or rhinitis.   glucose 4 GM chewable tablet Chew 1 tablet (4 g total) by mouth as needed for low blood sugar.   insulin aspart (NOVOLOG FLEXPEN) 100 UNIT/ML FlexPen Inject 25-30 Units into the skin See admin instructions. Inject 30 units subcutaneously in the morning, inject 25 units subcutaneously with lunch & inject 25 units subcutaneously with dinner.   Insulin Glargine (BASAGLAR KWIKPEN) 100 UNIT/ML Inject 30 Units into the skin at bedtime.   isosorbide mononitrate (IMDUR) 60 MG 24 hr tablet TAKE 1 TABLET BY MOUTH EVERY DAY   LIFESCAN FINEPOINT LANCETS MISC Use to test for blood sugar three times daily dx: E11.51   metoprolol succinate (TOPROL-XL) 25 MG 24 hr tablet TAKE 1 TABLET BY MOUTH DAILY. TAKE WITH OR IMMEDIATELY FOLLOWING A MEAL.   nitroGLYCERIN (NITROSTAT) 0.4 MG SL tablet PLACE 1 TABLET UNDER THE TONGUE EVERY 5 MINUTES AS NEEDED FOR CHEST PAIN.   ONETOUCH ULTRA test strip USE TO TEST FOR BLOOD SUGAR THREE TIMES DAILY DX: E11.51   oxyCODONE-acetaminophen (PERCOCET) 5-325 MG tablet Take 1-2 tablets by mouth every 8 (eight) hours as needed for moderate pain.   pantoprazole (PROTONIX) 40 MG tablet TAKE ONE TABLET BY MOUTH ONCE DAILY FOR STOMACH   PRALUENT 150 MG/ML SOAJ INJECT 1 PEN INTO THE SKIN EVERY 14 (FOURTEEN) DAYS.   Semaglutide,0.25 or 0.5MG /DOS, 2 MG/3ML SOPN Inject 0.5 Units into the skin once a week.   valsartan (DIOVAN) 160 MG tablet TAKE 1 TABLET (160 MG TOTAL) BY MOUTH  EVERY EVENING.   Facility-Administered Encounter Medications as of 03/06/2022  Medication   [COMPLETED] hydrALAZINE (APRESOLINE) injection 10 mg    PAST MEDICAL HISTORY: Past Medical History:  Diagnosis Date   Arthritis    "left thumb" (05/23/2017) right shoulder   Barrett's esophagus    "we've been told that it's all gone; still take RX for GERD" (05/23/2017)   Bilateral swelling of feet and  ankles x 3 weeks as of 12-01-2019   CAD (coronary artery disease), native coronary artery    Coronary angiogram 05/03/2014:  Proximal LAD 3.0x12 mm Promus premier stent.  04/08/2014: Mid Cx 3.5 x 16 mm Promus DES.  03/29/2013: Mid LAD 2.75 x 38 mm Promus Premier drug-eluting stent, balloon angioplasty of D1 and distal LAD and stenting of distal RCA with 2.75 x 24 mm promos Premier drug-eluting stent 12/15/2012 widely patent.   Cervicalgia    Chronic bronchitis ([place])    "q yr" (8/56/3149)   Complication of anesthesia    " I do not wake up very well "  slow to wake up   COVID-19 05/2019   all covid symptoms in hospital x 5 days, all symptoms resolved took monoclonal antibody tx    Dyspnea    with exertion   Family history of adverse reaction to anesthesia    "son w/PONV"   GERD (gastroesophageal reflux disease)    Heart murmur    "grew out of it"    Hyperlipidemia    Hypertension    Hypoglycemia, unspecified    IDDM (insulin dependent diabetes mellitus)    type 2   Impotence of organic origin    Iron deficiency anemia    Memory loss    mild   Other malaise and fatigue    Pneumonia 05/2019   PONV (postoperative nausea and vomiting)    after wisdom teeth pulled, no problems with other surgeries   Restless leg    Rotator cuff arthropathy    Stroke ([place])    Tension headache    "sometimes" (05/23/2017)   Unspecified gastritis and gastroduodenitis with hemorrhage    Unstable angina pectoris ([place]) 04/08/2014   Coronary angiogram 05/03/2014:  Proximal LAD 3.0x12 mm Promus premier stent.  04/08/2014:  Mid Cx 3.5 x 16 mm Promus DES.  03/29/2013: Mid LAD 2.75 x 38 mm Promus Premier drug-eluting stent, balloon angioplasty of D1 and distal LAD and stenting of distal RCA with 2.75 x 24 mm promos Premier drug-eluting stent 12/15/2012 widely patent.    PAST SURGICAL HISTORY: Past Surgical History:  Procedure Laterality Date   BICEPT TENODESIS Right 12/07/2019   Procedure: RIGHT BICEPS TENODESIS;  Surgeon: Renette Butters, MD;  Location: [place];  Service: Orthopedics;  Laterality: Right;   CARDIAC CATHETERIZATION N/A 09/25/2015   Procedure: Left Heart Cath and Coronary Angiography;  Surgeon: Adrian Prows, MD;  Location: [place] CV LAB;  Service: Cardiovascular;  Laterality: N/A;   CARDIAC CATHETERIZATION N/A 09/25/2015   Procedure: Intravascular Pressure Wire/FFR Study;  Surgeon: Adrian Prows, MD;  Location: [place] CV LAB;  Service: Cardiovascular;  Laterality: N/A;   CORONARY ANGIOPLASTY WITH STENT PLACEMENT  12/15/2012; 04/28/2014; 05/03/2014   "2; 1; 1" , 4 stents 2 balloons   CORONARY STENT INTERVENTION N/A 05/01/2021   Procedure: CORONARY STENT INTERVENTION;  Surgeon: Adrian Prows, MD;  Location: [place] CV LAB;  Service: Cardiovascular;  Laterality: N/A;   FRACTIONAL FLOW RESERVE WIRE Right 03/26/2013   Procedure: FRACTIONAL FLOW RESERVE WIRE;  Surgeon: Laverda Page, MD;  Location: [facility] CATH LAB;  Service: Cardiovascular;  Laterality: Right;   FRACTIONAL FLOW RESERVE WIRE N/A 05/03/2014   Procedure: FRACTIONAL FLOW RESERVE WIRE;  Surgeon: Laverda Page, MD;  Location: [facility] CATH LAB;  Service: Cardiovascular;  Laterality: N/A;   HERNIA REPAIR  7026   "umbilical"    INCISION AND DRAINAGE ABSCESS Left 05/2007   "groin"    KNEE SURGERY Right  1992;  2000   "calcium deposits removed" (12/15/2012)   LEFT HEART CATH AND CORONARY ANGIOGRAPHY N/A 05/23/2017   Procedure: LEFT HEART CATH AND CORONARY ANGIOGRAPHY;  Surgeon: [name], MD;  Location: [facility] INVASIVE CV LAB;   Service: Cardiovascular;  Laterality: N/A;   LEFT HEART CATH AND CORONARY ANGIOGRAPHY N/A 05/01/2021   Procedure: LEFT HEART CATH AND CORONARY ANGIOGRAPHY;  Surgeon: [name], MD;  Location: [facility] INVASIVE CV LAB;  Service: Cardiovascular;  Laterality: N/A;   LEFT HEART CATHETERIZATION WITH CORONARY ANGIOGRAM N/A 12/15/2012   Procedure: LEFT HEART CATHETERIZATION WITH CORONARY ANGIOGRAM;  Surgeon: Pamella Pert, MD;  Location: [facility] CATH LAB;  Service: Cardiovascular;  Laterality: N/A;   LEFT HEART CATHETERIZATION WITH CORONARY ANGIOGRAM N/A 03/26/2013   Procedure: LEFT HEART CATHETERIZATION WITH CORONARY ANGIOGRAM;  Surgeon: Pamella Pert, MD;  Location: [facility] CATH LAB;  Service: Cardiovascular;  Laterality: N/A;   LEFT HEART CATHETERIZATION WITH CORONARY ANGIOGRAM N/A 04/08/2014   Procedure: LEFT HEART CATHETERIZATION WITH CORONARY ANGIOGRAM;  Surgeon: Micheline Chapman, MD;  Location: [facility] CATH LAB;  Service: Cardiovascular;  Laterality: N/A;   MOUTH SURGERY Right    PERCUTANEOUS CORONARY STENT INTERVENTION (PCI-S) N/A 05/03/2014   Procedure: PERCUTANEOUS CORONARY STENT INTERVENTION (PCI-S);  Surgeon: Pamella Pert, MD;  Location: [facility] CATH LAB;  Service: Cardiovascular;  Laterality: N/A;   RADIOLOGY WITH ANESTHESIA N/A 02/08/2021   Procedure: MRI WITH ANESTHESIA OF ABDOMEN WITH AND WITHOUT CONSTRAST;  Surgeon: Radiologist, Medication, MD;  Location: [facility] OR;  Service: Radiology;  Laterality: N/A;   ROTATOR CUFF REPAIR  2022   UMBILICAL HERNIA REPAIR  yrs ago    ALLERGIES: Allergies  Allergen Reactions   Amlodipine Other (See Comments)    Worsening restless legs, dyspnea and leg swelling   Lyrica [Pregabalin] Other (See Comments)    Made patient very lethargic the next morning, very hard to patient to function, move, etc.    Statins Other (See Comments)    Causes Restless Legs, rhabdomyolysis   Ativan [Lorazepam] Other (See Comments)    Markedly increased restless legs   Trulicity  [Dulaglutide] Other (See Comments)    GI side effects     FAMILY HISTORY: Family History  Adopted: Yes  Problem Relation Age of Onset   Emphysema Mother     SOCIAL HISTORY: Social History   Tobacco Use   Smoking status: Never   Smokeless tobacco: Never  Vaping Use   Vaping Use: Never used  Substance Use Topics   Alcohol use: No   Drug use: No   Social History   Social History Narrative   Right handed    Caffeine 2 cups daily   Winter time 4 cups daily   Lives family one level home    Semi Retired      Objective:  Vital Signs:  BP (!) 154/85   Pulse 80   Ht 5\' 8"  (1.727 m)   Wt 196 lb 3.2 oz (89 kg)   SpO2 95%   BMI 29.83 kg/m   General: No acute distress. Intermittent jerking of legs > arms while sitting, resolves with standing. Head:  Normocephalic/atraumatic  Neurological Exam: Mental status: alert and oriented, speech fluent and not dysarthric, language intact.  Cranial nerves: CN I: not tested CN II: pupils equal, round and reactive to light, visual fields intact CN III, IV, VI:  full range of motion, no nystagmus, no ptosis CN V: facial sensation intact. CN VII: upper and lower face symmetric CN VIII: hearing intact  CN IX, X: gag intact, uvula midline CN XI: sternocleidomastoid and trapezius muscles intact CN XII: tongue midline  Bulk & Tone: normal, no fasciculations. Intermittent muscle jerks mostly in legs today Motor:  muscle strength 5/5 throughout Deep Tendon Reflexes:  2+ throughout Sensation:  Sensation intact to light touch in all extremities. Finger to nose testing:  Without dysmetria.   Coordination: No bradykinesia. RAM normal. Gait:  Normal station and stride.  No abnormal movements when standing or walking.  Labs and Imaging review: New results: HbA1c (02/28/22): 7.8  Previously reviewed results: 10/22/21: Ferritin 21 B12: 248 Normal or unremarkable: Vit D CMP significant for elevated glucose (193) and BUN (28) CBC  significant for platelets of 110 (chronic)  EMG (03/23/14): Normal study of RUE and RLE (no myopathy, radiculopathy, or peripheral neuropathy).   MRI brain, MRA head and neck (01/14/21): IMPRESSION: MRI HEAD:   Normal brain MRI for age. No acute intracranial infarct or other abnormality identified.   MRA HEAD:   Normal intracranial MRA. No large vessel occlusion or hemodynamically significant stenosis. No aneurysm.   MRA NECK:   1. 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 The distal right V4 segment is patent on corresponding MRA head portion of this exam with perfusion of the right PICA. 2. Widely patent dominant left vertebral artery. 3. Wide patency of both carotid artery systems within the neck.  Assessment/Plan:  Latasha Puskas Sr. is a 72 y.o. male who presents for evaluation of abnormal movements.He has a relevant medical history of restless leg syndrome, iron deficiency, arthritis, COPD, CAD, HTN, HLD, and DM2. He has a long history of RLS and found to have iron deficiency. Certainly this could explain some of patient's symptoms. On further evaluation today, movements are different than previous (more in arms and trunk at 01/02/22 visit, more in legs today). 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 Withdrawal from pramipexole would not be expected to cause this jerking. Gabapentin can cause myoclonic jerks, but symptoms were present prior to gabapentin per wife and much more severe than would be expected. The whole body jerking is also not consistent with his previous RLS symptoms. With further history from wife today, it appears this jerking may have been present for longer than previously thought. There is no signs of parkinsonism on exam today and no dyskinesia/chorea to suggest medication or central process currently. I  discussed function neurologic deficits and functional movement disorder with patient today. I explained this was not a structural (hardware) problem, but more a functional (software) problem and that "retraining" the body is the best approach.  Plan: -Continue B12 supplementation -Continue iron supplementation  -Stop pramipexole -Discussed gabapentin, will continue 300 mg BID -Therapy for FND with [facility] - referral placed today -Sleep medicine as previously planned to help with sleep  Return to clinic in 1 month  Total time spent reviewing records, interview, history/exam, documentation, and coordination of care on day of encounter:  85 min  Kai Levins, MD

## 2022-03-05 NOTE — ED Triage Notes (Signed)
PT BIB EMS for Restless Leg Syndrome. Pt was recently taken off of Pramipexole by his PCP. Pt has been having jerking and cramping since. Pt reports the jerking is all over his body and not just in one area.  Pt reports it has gotten worse this morning. Pt has not had his daily medications today. Pts vitals as noted.

## 2022-03-05 NOTE — Telephone Encounter (Signed)
Pt left message stating he would like to speak to Dr Berdine Addison about his recent visit to the ER. Pt requests call back.

## 2022-03-05 NOTE — Telephone Encounter (Signed)
Returned patient's call. He was seen by [facility] and [place] [facility]. Per 03/04/22 [facility] ED note: Patient seen at [facility] 03/04/22 after seeing [facility] on 03/02/22 for the same C/o. advised to  Stop taking pramipexole! - Continue home iron supplementations as tolerated - Continue Gabapentin, take 300 mg in AM, 300 mg at 12:00 PM (noon), 900 mg at night - They recommend follow up with [facility]. A referral has been placed, and you should be hearing from them. - They recommend improving sleep hygiene with: Setting regular sleep hours, avoiding electronics in bed, practice visualization exercises if he cannot fall asleep, limit liquids prior to sleep, and eliminate all naps.   Patient is currently taking 0.5 mg of pramipexole nightly. He will be stopping this tonight. He continues to take gabapentin 300 mg in morning, 300 mg midday, 900 mg at bedtime. He is very miserable. We discussed that he should stop the pramipexole as discussed. I will also order sleep medicine referral as patient would like to see someone locally. We also discussed trial of clonazepam but patient previously had a bad reaction to ativan and did not want to try this.  All questions were answered.  Kai Levins, MD [facility] Neurology

## 2022-03-05 NOTE — ED Provider Triage Note (Signed)
Emergency Medicine Provider Triage Evaluation Note  Masaki Rothbauer Sr. , a 72 y.o. male  was evaluated in triage.  Pt complains of restless leg.  Recently taken off his pramipexole by his PCP due to elevated dopamine levels.  Since then his legs and homebody are violently shaking all the time.  Its gotten unbearably worse.  Now endorsing associated pain.  Denies chest pain or shortness of breath.  Review of Systems  Positive: See above Negative: See above  Physical Exam  BP (!) 168/77 (BP Location: Right Arm)   Pulse 69   Temp (!) 97.5 F (36.4 C) (Oral)   Resp 15   SpO2 92%  Gen:   Awake, no distress   Resp:  Normal effort  MSK:   Moves extremities without difficulty, jerking in all extremities. Other:    Medical Decision Making  Medically screening exam initiated at 2:54 PM.  Appropriate orders placed.  Jamas Lav Sr. was informed that the remainder of the evaluation will be completed by another provider, this initial triage assessment does not replace that evaluation, and the importance of remaining in the ED until their evaluation is complete.  Work up started   Harriet Pho, PA-C 03/05/22 1456

## 2022-03-06 ENCOUNTER — Encounter: Payer: Self-pay | Admitting: Neurology

## 2022-03-06 ENCOUNTER — Ambulatory Visit (INDEPENDENT_AMBULATORY_CARE_PROVIDER_SITE_OTHER): Payer: Medicare Other | Admitting: Neurology

## 2022-03-06 ENCOUNTER — Telehealth: Payer: Self-pay | Admitting: Neurology

## 2022-03-06 VITALS — BP 154/85 | HR 80 | Ht 68.0 in | Wt 196.2 lb

## 2022-03-06 DIAGNOSIS — G2581 Restless legs syndrome: Secondary | ICD-10-CM | POA: Diagnosis not present

## 2022-03-06 DIAGNOSIS — G259 Extrapyramidal and movement disorder, unspecified: Secondary | ICD-10-CM

## 2022-03-06 MED ORDER — GABAPENTIN 100 MG PO CAPS: 300.0000 mg | ORAL_CAPSULE | Freq: Two times a day (BID) | ORAL | 5 refills | Status: DC

## 2022-03-06 MED ORDER — FERROUS SULFATE 325 (65 FE) MG PO TBEC: 325.0000 mg | DELAYED_RELEASE_TABLET | Freq: Every day | ORAL | 11 refills | Status: DC

## 2022-03-06 NOTE — Patient Instructions (Addendum)
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  Continue B12 supplement Continue iron: -Ferrous sulfate 325 mg daily x 3 months.  Discussed not to take with milk.  Discussed that this can cause constipation and to drink plenty of water.   -Take vitamin C, 100-200 mg with each ferrous sulfate dose, or take the ferrous sulfate with small glass of orange juice (if not diabetic)  Stop pramipexole  Continue gabapentin for now 300 mg twice daily (morning and night).  Go to sleep medicine as planned. I put this referral in for you.  Physical therapy to help with functional movements.  Return to clinic in 1 month  The physicians and staff at [facility] are committed to providing excellent care. You may receive a survey requesting feedback about your experience at our office. We strive to receive "very good" responses to the survey questions. If you feel that your experience would prevent you from giving the office a "very good " response, please contact our office to try to remedy the situation. We may be reached at [phone]. Thank you for taking the time out of your busy day to complete the survey.  Kai Levins, MD [facility]

## 2022-03-06 NOTE — Telephone Encounter (Signed)
Patients wife called, pt is in ER with leg pain (RLS) can't sit walk or stand. Wants a call back from physician

## 2022-03-06 NOTE — Telephone Encounter (Signed)
Returned patient's call. He is currently in the ED for his abnormal movements. I explained that the ED was likely not the best place to be evaluated and that I would prefer he come to our appointment at 9 am today to be re-evaluated. Patient agreed and will try to be discharged from ED and make outpatient appointment.  Kai Levins, MD [facility]

## 2022-03-06 NOTE — ED Notes (Signed)
Pt left ED.

## 2022-03-07 ENCOUNTER — Ambulatory Visit (INDEPENDENT_AMBULATORY_CARE_PROVIDER_SITE_OTHER): Payer: Medicare Other | Admitting: Orthopedic Surgery

## 2022-03-07 ENCOUNTER — Encounter: Payer: Self-pay | Admitting: Orthopedic Surgery

## 2022-03-07 ENCOUNTER — Ambulatory Visit: Payer: Medicare Other | Admitting: Adult Health

## 2022-03-07 VITALS — BP 130/80 | HR 67 | Temp 97.3°F | Resp 18 | Ht 68.0 in | Wt 197.5 lb

## 2022-03-07 DIAGNOSIS — R051 Acute cough: Secondary | ICD-10-CM

## 2022-03-07 DIAGNOSIS — R059 Cough, unspecified: Secondary | ICD-10-CM

## 2022-03-07 DIAGNOSIS — J40 Bronchitis, not specified as acute or chronic: Secondary | ICD-10-CM | POA: Diagnosis not present

## 2022-03-07 LAB — POC COVID19 BINAXNOW: SARS Coronavirus 2 Ag: NEGATIVE

## 2022-03-07 LAB — POCT INFLUENZA A/B
Influenza A, POC: NEGATIVE
Influenza B, POC: NEGATIVE

## 2022-03-07 MED ORDER — PREDNISONE 10 MG PO TABS: ORAL_TABLET | ORAL | 0 refills | Status: AC

## 2022-03-07 MED ORDER — BENZONATATE 100 MG PO CAPS: 200.0000 mg | ORAL_CAPSULE | Freq: Three times a day (TID) | ORAL | 0 refills | Status: DC | PRN

## 2022-03-07 NOTE — Progress Notes (Signed)
Careteam: Patient Care Team: [name], NP as PCP - General (Geriatric Medicine) [name], MD as Consulting Physician (Cardiology) Van Clines, MD as Consulting Physician (Neurology) [name], [name], OD (Optometry) [name], MD as Consulting Physician (Otolaryngology)  Seen by: Hazle Nordmann, AGNP-C  PLACE OF SERVICE:  [facility]  Advanced Directive information    Allergies  Allergen Reactions   Amlodipine Other (See Comments)    Worsening restless legs, dyspnea and leg swelling   Lyrica [Pregabalin] Other (See Comments)    Made patient very lethargic the next morning, very hard to patient to function, move, etc.    Statins Other (See Comments)    Causes Restless Legs, rhabdomyolysis   Ativan [Lorazepam] Other (See Comments)    Markedly increased restless legs   Trulicity [Dulaglutide] Other (See Comments)    GI side effects     Chief Complaint  Patient presents with   Medical Management of Chronic Issues    Patient is here today because patient states that he has been coughing up yellow mucous with some green and having tight in the chest from coughing, head stop up with running nose.     HPI: Patient is a 72 y.o. male seen today for acute visit due to cough.   Increased cough x 2 days. He has been taking tessalon pearls with some improvement. Cough is sometimes productive with yellow phlegm. Upset cough is keeping him up at night. He reports chest discomfort from coughing so much. He is also having nasal congestion with clear drainage. Denies fever,shortness of breath, sore throat, sinus pain, body aches, and ear pain. He has presented to the ED within the past week due to neuropathy and exposed to sick persons. In house covid and flu tests negative. Afebrile. Vitals stable.     Review of Systems:  Review of Systems  Constitutional:  Positive for malaise/fatigue. Negative for chills and fever.  HENT:  Positive for congestion. Negative for ear pain,  sinus pain and sore throat.   Eyes:  Negative for blurred vision and double vision.  Respiratory:  Positive for cough and sputum production. Negative for shortness of breath and wheezing.   Cardiovascular:  Negative for chest pain and leg swelling.  Gastrointestinal:  Negative for nausea and vomiting.  Musculoskeletal:  Negative for myalgias.  Neurological:  Negative for dizziness and headaches.  Psychiatric/Behavioral:  Negative for depression. The patient has insomnia. The patient is not nervous/anxious.     Past Medical History:  Diagnosis Date   Arthritis    "left thumb" (05/23/2017) right shoulder   Barrett's esophagus    "we've been told that it's all gone; still take RX for GERD" (05/23/2017)   Bilateral swelling of feet and ankles x 3 weeks as of 12-01-2019   CAD (coronary artery disease), native coronary artery    Coronary angiogram 05/03/2014:  Proximal LAD 3.0x12 mm Promus premier stent.  04/08/2014: Mid Cx 3.5 x 16 mm Promus DES.  03/29/2013: Mid LAD 2.75 x 38 mm Promus Premier drug-eluting stent, balloon angioplasty of D1 and distal LAD and stenting of distal RCA with 2.75 x 24 mm promos Premier drug-eluting stent 12/15/2012 widely patent.   Cervicalgia    Chronic bronchitis (HCC)    "q yr" (05/23/2017)   Complication of anesthesia    " I do not wake up very well "  slow to wake up   COVID-19 05/2019   all covid symptoms in hospital x 5 days, all symptoms resolved took  monoclonal antibody tx    Dyspnea    with exertion   Family history of adverse reaction to anesthesia    "son w/PONV"   GERD (gastroesophageal reflux disease)    Heart murmur    "grew out of it"    Hyperlipidemia    Hypertension    Hypoglycemia, unspecified    IDDM (insulin dependent diabetes mellitus)    type 2   Impotence of organic origin    Iron deficiency anemia    Memory loss    mild   Other malaise and fatigue    Pneumonia 05/2019   PONV (postoperative nausea and vomiting)    after wisdom teeth  pulled, no problems with other surgeries   Restless leg    Rotator cuff arthropathy    Stroke ([place])    Tension headache    "sometimes" (05/23/2017)   Unspecified gastritis and gastroduodenitis with hemorrhage    Unstable angina pectoris ([place]) 04/08/2014   Coronary angiogram 05/03/2014:  Proximal LAD 3.0x12 mm Promus premier stent.  04/08/2014: Mid Cx 3.5 x 16 mm Promus DES.  03/29/2013: Mid LAD 2.75 x 38 mm Promus Premier drug-eluting stent, balloon angioplasty of D1 and distal LAD and stenting of distal RCA with 2.75 x 24 mm promos Premier drug-eluting stent 12/15/2012 widely patent.   Past Surgical History:  Procedure Laterality Date   BICEPT TENODESIS Right 12/07/2019   Procedure: RIGHT BICEPS TENODESIS;  Surgeon: Renette Butters, MD;  Location: [facility];  Service: Orthopedics;  Laterality: Right;   CARDIAC CATHETERIZATION N/A 09/25/2015   Procedure: Left Heart Cath and Coronary Angiography;  Surgeon: Adrian Prows, MD;  Location: [place] [facility];  Service: Cardiovascular;  Laterality: N/A;   CARDIAC CATHETERIZATION N/A 09/25/2015   Procedure: Intravascular Pressure Wire/FFR Study;  Surgeon: Adrian Prows, MD;  Location: [place] [facility];  Service: Cardiovascular;  Laterality: N/A;   CORONARY ANGIOPLASTY WITH STENT PLACEMENT  12/15/2012; 04/28/2014; 05/03/2014   "2; 1; 1" , 4 stents 2 balloons   CORONARY STENT INTERVENTION N/A 05/01/2021   Procedure: CORONARY STENT INTERVENTION;  Surgeon: Adrian Prows, MD;  Location: [place] [facility];  Service: Cardiovascular;  Laterality: N/A;   FRACTIONAL FLOW RESERVE WIRE Right 03/26/2013   Procedure: FRACTIONAL FLOW RESERVE WIRE;  Surgeon: Laverda Page, MD;  Location: [facility] C[facility] LAB;  Service: Cardiovascular;  Laterality: Right;   FRACTIONAL FLOW RESERVE WIRE N/A 05/03/2014   Procedure: FRACTIONAL FLOW RESERVE WIRE;  Surgeon: Laverda Page, MD;  Location: [facility] CATH LAB;  Service: Cardiovascular;  Laterality: N/A;   HERNIA REPAIR  [id number]    "umbilical"    INCISION AND DRAINAGE ABSCESS Left 05/2007   "groin"    KNEE SURGERY Right 1992;  2000   "calcium deposits removed" (12/15/2012)   LEFT HEART CATH AND CORONARY ANGIOGRAPHY N/A 05/23/2017   Procedure: LEFT HEART CATH AND CORONARY ANGIOGRAPHY;  Surgeon: Adrian Prows, MD;  Location: [place] [facility];  Service: Cardiovascular;  Laterality: N/A;   LEFT HEART CATH AND CORONARY ANGIOGRAPHY N/A 05/01/2021   Procedure: LEFT HEART CATH AND CORONARY ANGIOGRAPHY;  Surgeon: Adrian Prows, MD;  Location: [place] [facility];  Service: Cardiovascular;  Laterality: N/A;   LEFT HEART CATHETERIZATION WITH CORONARY ANGIOGRAM N/A 12/15/2012   Procedure: LEFT HEART CATHETERIZATION WITH CORONARY ANGIOGRAM;  Surgeon: Laverda Page, MD;  Location: [facility] CATH LAB;  Service: Cardiovascular;  Laterality: N/A;   LEFT HEART CATHETERIZATION WITH CORONARY ANGIOGRAM N/A 03/26/2013   Procedure: LEFT HEART CATHETERIZATION WITH  CORONARY ANGIOGRAM;  Surgeon: Laverda Page, MD;  Location: [facility] CATH LAB;  Service: Cardiovascular;  Laterality: N/A;   LEFT HEART CATHETERIZATION WITH CORONARY ANGIOGRAM N/A 04/08/2014   Procedure: LEFT HEART CATHETERIZATION WITH CORONARY ANGIOGRAM;  Surgeon: Blane Ohara, MD;  Location: [facility] CATH LAB;  Service: Cardiovascular;  Laterality: N/A;   MOUTH SURGERY Right    PERCUTANEOUS CORONARY STENT INTERVENTION (PCI-S) N/A 05/03/2014   Procedure: PERCUTANEOUS CORONARY STENT INTERVENTION (PCI-S);  Surgeon: Laverda Page, MD;  Location: [facility] CATH LAB;  Service: Cardiovascular;  Laterality: N/A;   RADIOLOGY WITH ANESTHESIA N/A 02/08/2021   Procedure: MRI WITH ANESTHESIA OF ABDOMEN WITH AND WITHOUT CONSTRAST;  Surgeon: Radiologist, Medication, MD;  Location: [place];  Service: Radiology;  Laterality: N/A;   ROTATOR CUFF REPAIR  [id number]   UMBILICAL HERNIA REPAIR  yrs ago   Social History:   reports that he has never smoked. He has never used smokeless tobacco. He reports that he does not drink  alcohol and does not use drugs.  Family History  Adopted: Yes  Problem Relation Age of Onset   Emphysema Mother     Medications: Patient's Medications  New Prescriptions   No medications on file  Previous Medications   ACETAMINOPHEN (TYLENOL) 500 MG TABLET    Take 500 mg by mouth every 6 (six) hours as needed for mild pain, moderate pain, headache or fever.   ALBUTEROL (VENTOLIN HFA) 108 (90 BASE) MCG/ACT INHALER    TAKE 2 PUFFS BY MOUTH EVERY 6 HOURS AS NEEDED FOR WHEEZE OR SHORTNESS OF BREATH   ASPIRIN EC 81 MG TABLET    Take 1 tablet (81 mg total) by mouth daily. Swallow whole.   BD PEN NEEDLE NANO 2ND GEN 32G X 4 MM MISC    USE AS DIRECTED   BUDESON-GLYCOPYRROL-FORMOTEROL (BREZTRI AEROSPHERE) 160-9-4.8 MCG/ACT AERO    Inhale 2 puffs into the lungs in the morning and at bedtime.   CLOPIDOGREL (PLAVIX) 75 MG TABLET    Take 1 tablet (75 mg total) by mouth daily.   CONTINUOUS BLOOD GLUC RECEIVER (FREESTYLE LIBRE 14 DAY READER) DEVI    Inject 1 Device as directed daily as needed. Use once daily as direct to check blood sugar E11.51   CONTINUOUS BLOOD GLUC SENSOR (FREESTYLE LIBRE 14 DAY SENSOR) MISC    USE TO TEST BLOOD SUGAR THREE TIMES DAILY. E11.51. E11.59   FERROUS SULFATE 325 (65 FE) MG EC TABLET    Take 1 tablet (325 mg total) by mouth daily.   FLUTICASONE (FLONASE) 50 MCG/ACT NASAL SPRAY    Place 1 spray into both nostrils daily as needed for allergies or rhinitis.   GABAPENTIN (NEURONTIN) 100 MG CAPSULE    Take 3 capsules (300 mg total) by mouth 2 (two) times daily.   GLUCOSE 4 GM CHEWABLE TABLET    Chew 1 tablet (4 g total) by mouth as needed for low blood sugar.   INSULIN ASPART (NOVOLOG FLEXPEN) 100 UNIT/ML FLEXPEN    Inject 25-30 Units into the skin See admin instructions. Inject 30 units subcutaneously in the morning, inject 25 units subcutaneously with lunch & inject 25 units subcutaneously with dinner.   INSULIN GLARGINE (BASAGLAR KWIKPEN) 100 UNIT/ML    Inject 30 Units into  the skin at bedtime.   ISOSORBIDE MONONITRATE (IMDUR) 60 MG 24 HR TABLET    TAKE 1 TABLET BY MOUTH EVERY DAY   LIFESCAN FINEPOINT LANCETS MISC    Use to test for blood sugar three times daily  dx: E11.51   METOPROLOL SUCCINATE (TOPROL-XL) 25 MG 24 HR TABLET    TAKE 1 TABLET BY MOUTH DAILY. TAKE WITH OR IMMEDIATELY FOLLOWING A MEAL.   NITROGLYCERIN (NITROSTAT) 0.4 MG SL TABLET    PLACE 1 TABLET UNDER THE TONGUE EVERY 5 MINUTES AS NEEDED FOR CHEST PAIN.   ONETOUCH ULTRA TEST STRIP    USE TO TEST FOR BLOOD SUGAR THREE TIMES DAILY DX: E11.51   PANTOPRAZOLE (PROTONIX) 40 MG TABLET    TAKE ONE TABLET BY MOUTH ONCE DAILY FOR STOMACH   PRALUENT 150 MG/ML SOAJ    INJECT 1 PEN INTO THE SKIN EVERY 14 (FOURTEEN) DAYS.   SEMAGLUTIDE,0.25 OR 0.5MG /DOS, 2 MG/3ML SOPN    Inject 0.5 Units into the skin once a week.   VALSARTAN (DIOVAN) 160 MG TABLET    TAKE 1 TABLET (160 MG TOTAL) BY MOUTH EVERY EVENING.  Modified Medications   No medications on file  Discontinued Medications   OXYCODONE-ACETAMINOPHEN (PERCOCET) 5-325 MG TABLET    Take 1-2 tablets by mouth every 8 (eight) hours as needed for moderate pain.    Physical Exam:  Vitals:   03/07/22 1431  BP: 130/80  Pulse: 67  Resp: 18  Temp: (!) 97.3 F (36.3 C)  SpO2: 95%  Weight: 197 lb 8 oz (89.6 kg)  Height: 5\' 8"  (1.727 m)   Body mass index is 30.03 kg/m. Wt Readings from Last 3 Encounters:  03/07/22 197 lb 8 oz (89.6 kg)  03/06/22 196 lb 3.2 oz (89 kg)  02/28/22 202 lb 9.6 oz (91.9 kg)    Physical Exam Vitals reviewed.  Constitutional:      General: He is not in acute distress. HENT:     Head: Normocephalic.     Right Ear: There is no impacted cerumen.     Left Ear: There is no impacted cerumen.     Nose: Congestion present.     Right Turbinates: Enlarged.     Left Turbinates: Enlarged.     Right Sinus: No maxillary sinus tenderness or frontal sinus tenderness.     Left Sinus: No maxillary sinus tenderness or frontal sinus  tenderness.     Mouth/Throat:     Mouth: Mucous membranes are moist.     Pharynx: No posterior oropharyngeal erythema.  Eyes:     General:        Right eye: No discharge.        Left eye: No discharge.  Cardiovascular:     Rate and Rhythm: Normal rate and regular rhythm.     Pulses: Normal pulses.     Heart sounds: Normal heart sounds.  Pulmonary:     Effort: Pulmonary effort is normal. No respiratory distress.     Breath sounds: Normal breath sounds. No wheezing or rales.  Abdominal:     General: Bowel sounds are normal.     Palpations: Abdomen is soft.  Musculoskeletal:     Cervical back: Neck supple.     Right lower leg: No edema.     Left lower leg: No edema.  Lymphadenopathy:     Cervical: No cervical adenopathy.  Skin:    General: Skin is warm and dry.     Capillary Refill: Capillary refill takes less than 2 seconds.  Neurological:     General: No focal deficit present.     Mental Status: He is alert and oriented to person, place, and time.  Psychiatric:        Mood and Affect: Mood normal.  Behavior: Behavior normal.     Labs reviewed: Basic Metabolic Panel: Recent Labs    09/03/21 0826 10/05/21 0859 01/01/22 0513 01/01/22 0936 03/02/22 1854 03/05/22 1513  NA  --    < > 139  --  139 137  K  --    < > 4.1  --  4.1 4.5  CL  --    < > 105  --  103 103  CO2  --    < > 24  --  30 26  GLUCOSE  --    < > 89  --  88 110*  BUN  --    < > 18  --  18 20  CREATININE  --    < > 1.28*  --  1.29* 1.25*  CALCIUM  --    < > 9.3  --  10.0 9.2  MG 2.6*  --   --  2.1 2.2  --   PHOS  --   --   --   --  4.3  --    < > = values in this interval not displayed.   Liver Function Tests: Recent Labs    10/05/21 0859 10/22/21 1543 03/05/22 1513  AST 22 24 44*  ALT 28 30 41  ALKPHOS  --  54 56  BILITOT 0.5 0.6 0.9  PROT 6.4 6.6 6.9  ALBUMIN  --  4.1 4.1   No results for input(s): "LIPASE", "AMYLASE" in the last 8760 hours. No results for input(s): "AMMONIA" in  the last 8760 hours. CBC: Recent Labs    10/05/21 0859 10/22/21 1543 01/01/22 0513 03/02/22 1854 03/05/22 1513  WBC 5.2   < > 7.1 7.7 7.2  NEUTROABS 3,557  --   --   --  5.1  HGB 13.0*   < > 15.7 15.7 14.6  HCT 39.4   < > 45.4 45.6 43.8  MCV 86.6   < > 86.8 89.1 89.9  PLT 133*   < > 146* 125* 128*   < > = values in this interval not displayed.   Lipid Panel: Recent Labs    01/21/22 0857  CHOL 150  HDL 38*  LDLCALC 85  TRIG 175*  CHOLHDL 3.9   TSH: No results for input(s): "TSH" in the last 8760 hours. A1C: Lab Results  Component Value Date   HGBA1C 7.8 (A) 02/28/2022     Assessment/Plan 1. Bronchitis - persistent cough x 2 days - lung sounds clear, mild nasal congestion - will gives reduced prednisone taper due to T2DM> advised to check sugars - cont tessalon pearls prn - encourage rest and fluids - contact PCP if symptoms worsen or do not improve - POC COVID-19- negative - POC Influenza A/B- negative - predniSONE (DELTASONE) 10 MG tablet; Take 2 tablets (20 mg total) by mouth daily with breakfast for 2 days, THEN 1 tablet (10 mg total) daily with breakfast for 3 days.  Dispense: 7 tablet; Refill: 0 - benzonatate (TESSALON PERLES) 100 MG capsule; Take 2 capsules (200 mg total) by mouth 3 (three) times daily as needed for cough.  Dispense: 30 capsule; Refill: 0  Total time: 21 minutes. Greater than 50% of total time spent doing patient education regarding acute cough including symptom/medication treatment.   Next appt: Visit date not found  [place], [facility] [phone]

## 2022-03-07 NOTE — Patient Instructions (Addendum)
Will try prednisone taper to help with cough- check blood sugars  Continue with tessalon pearls

## 2022-03-11 ENCOUNTER — Telehealth: Payer: Self-pay

## 2022-03-11 NOTE — Telephone Encounter (Signed)
Patient wants to know if Ozempic could be causing his restless leg. Since being on the higher dose he says that the restless leg is unbearable. His dose is currently 0.5mg . Should he go back to taking 0.25mg ?

## 2022-03-11 NOTE — Telephone Encounter (Signed)
I doubt that restless leg is due to this, he can reduce the dose and see how he does

## 2022-03-11 NOTE — Telephone Encounter (Signed)
Error

## 2022-03-12 ENCOUNTER — Ambulatory Visit: Payer: Medicare Other | Admitting: Cardiology

## 2022-03-12 NOTE — Telephone Encounter (Signed)
Called patient to inform him about the message above patient understood

## 2022-03-14 ENCOUNTER — Ambulatory Visit: Payer: Medicare Other | Admitting: Neurology

## 2022-03-20 ENCOUNTER — Encounter: Payer: Self-pay | Admitting: Neurology

## 2022-03-20 ENCOUNTER — Telehealth: Payer: Self-pay | Admitting: Neurology

## 2022-03-20 NOTE — Telephone Encounter (Signed)
Pt called in stating he has been having trouble sleeping and had bee told about a medication named ropinirole. He is wondering if that might be a good thing for him to try. He has had to resort to taking Pramipexole again. He is trying not to take it every night.

## 2022-03-21 ENCOUNTER — Encounter: Payer: Self-pay | Admitting: Nurse Practitioner

## 2022-03-21 ENCOUNTER — Ambulatory Visit (INDEPENDENT_AMBULATORY_CARE_PROVIDER_SITE_OTHER): Payer: Medicare Other | Admitting: Nurse Practitioner

## 2022-03-21 VITALS — BP 120/82 | HR 68 | Ht 68.0 in | Wt 206.8 lb

## 2022-03-21 DIAGNOSIS — G2581 Restless legs syndrome: Secondary | ICD-10-CM

## 2022-03-21 DIAGNOSIS — R0602 Shortness of breath: Secondary | ICD-10-CM | POA: Diagnosis not present

## 2022-03-21 DIAGNOSIS — E669 Obesity, unspecified: Secondary | ICD-10-CM | POA: Insufficient documentation

## 2022-03-21 DIAGNOSIS — G4719 Other hypersomnia: Secondary | ICD-10-CM | POA: Insufficient documentation

## 2022-03-21 NOTE — Assessment & Plan Note (Signed)
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 Discussed that untreated OSA can contribute to these symptoms. He will need an in lab study given the severity of his symptoms. Urgent split night ordered.   Patient Instructions  Given your symptoms and history, I am concerned that you may have sleep disordered breathing with sleep apnea. You will need a sleep study for further evaluation. Someone will contact you for scheduling.  We discussed how untreated sleep apnea puts an individual at risk for cardiac arrhthymias, pulm HTN, DM, stroke and increases their risk for daytime accidents. We also briefly reviewed treatment options including weight loss, side sleeping position, oral appliance, CPAP therapy or referral to ENT for possible surgical options  Use extreme caution when driving and pull over if you become sleepy. Would encourage you to limit distance driving until we know better what's going on with your sleep.  Continue Albuterol inhaler 2 puffs every 6 hours as needed for shortness of breath or wheezing. Notify if symptoms persist despite rescue inhaler/neb use.  Continue Breztri 2 puffs Twice daily. Brush tongue and rinse mouth afterwards  Take 0.5 mg-1 mg of pramipexole as needed for RLS the night of your study   Follow up in 5-6 weeks with Dr. Ander Slade (new pt 30 min slot) or Katie Jhalil Silvera,NP. If symptoms do not improve or worsen, please contact office for sooner follow up or seek emergency care.

## 2022-03-21 NOTE — Assessment & Plan Note (Signed)
BMI 31. Reviewed correlation between obesity and OSA. Healthy weight loss encouraged.

## 2022-03-21 NOTE — Assessment & Plan Note (Signed)
Stable symptoms. Normal PFTs recently. Given his history, question asthma component? He is currently using albuterol PRN and occasionally uses Breztri as well. Discussed role of maintenance vs rescue. He will notify if symptoms worsen.

## 2022-03-21 NOTE — Progress Notes (Signed)
@Patient  ID: Sr., male    DOB: 25-Jun-1950, 72 y.o.   MRN: [id number]  Chief Complaint  Patient presents with   Consult    Pt sleep consult, pt has had a sleep study and never used a CPAP machine. He is being treated for restless leg syndrome. Sees Dr.Ellison for SOB    Referring provider: [id number], MD  HPI: 72 year old male, never smoker followed for shortness of breath; now here for sleep consult. He is a patient of Dr. 61 and last seen in office 01/17/2022. Past medical history significant for DM2, CAD, HTN, hx of CVA, CKD stage III, GERD, mild PAH, RLS.  TEST/EVENTS:  02/11/2021 CTA chest: No evidence of PE.  Atherosclerosis.  No LAD.  Evaluation of lung parenchyma limited.  No acute process or suspicious nodules. 01/02/2022 PFT: FVC 91, FEV1 103, ratio 88, TLC 80, DLCOcor 85.  No BD.   01/17/2022: OV with Dr. 01/19/2022. Initially seen in September 2023 for consult due to SOB with onset around November 2022 when he underwent cardiac stent. Initially had symptoms at night where he felt he was drowning.  Nighttime symptoms resolved.  He continued to have dyspnea during exertion including walking and using upper body to carry things.  Did not have any cough.  Has noticed some chest congestion and wheeze in the last 3 months.  Usually has 2-3 episodes of bronchitis a year, triggered by damp weather in the fall.  Does also have chest tightness when he is ill.  Strong odors like perfumes can affect his breathing.  During childhood he had bronchitis that seem to be more prevalent in his 85s but denies asthma.  Had COVID in 2021.  Only able to sing for 45 minutes when he used to be able to sing for 3 hours.  Uses albuterol as needed 3 times a week on average.  Completed cardiac rehab summer 2023.  Underwent pulmonary function testing which was normal.  Using Breztri intermittently; did feel like it helped his symptoms.  Has not been taking it daily.  Still has shortness of breath  with singing and activity requiring frequent breaks.  Counseled on bronchodilator use if benefit perceived.  Prefers to hold on steroid inhaler unless needed and willing to use albuterol more frequently.  Will contact the office for follow-up as needed.  03/21/2022: Today - sleep consult Patient presents today for sleep consult. He has been struggling with restless leg for many years now.  Progressively has gotten worse, despite multiple pharmacological therapies.  He is currently taking pramipexole 0.5 mg as needed. At some point, he had gone up to 5 mg a night on this and was having a lot of trouble staying awake during the day so his neurologist weaned him off of it. He had to restart at a lower dose due to the pain and discomfort he was having. They have not been able to find an underlying cause of his persistent symptoms so his neurologist suggested that he have a sleep consult for further evaluation. He did have a sleep study around 2 years ago; however, he was unable to sleep during the study and they had trouble getting accurate data due to his RLS. At the time, he was not on medication for this. He admits that he has a lot of trouble with daytime fatigue; attributes this to lack of sleep at night. Most nights he barely sleeps. If he takes his Mirapex, he can usually get 3 hours  in and then wake up and go back to sleep in the morning for an hour or so. He has fallen asleep while driving longer distances; most recent was 3 months ago. He admits to snoring at night. Has not been told that he stops breathing at night. Denies morning headaches, sleep parasomnias or paralysis. He has had normal iron studies recently.  He goes to bed around 9 PM.  Usually falls asleep quickly if he takes his Mirapex; otherwise he is up most the night.  Officially gets out of bed around 5 AM.  His weight is up 30 pounds in the last 2 years.  Last sleep study was 2 years ago with inadequate results; does not remember the location  or name of provider that this was performed with.   He has a history of high blood pressure, diabetes; controlled on medications.  He does have a history of TIA in the past. He is a never smoker.  Does not drink any alcohol.  Lives at home with his wife.  He works as a Counsellor.  Does use power tools/woodworking machinery. Unknown family history.  His shortness of breath has improved since he was here last. No cough or chest congestion. He using the [place] he has occasionally but not on a weekly basis. Uses albuterol once a week.   Epworth 15   Allergies  Allergen Reactions   Amlodipine Other (See Comments)    Worsening restless legs, dyspnea and leg swelling   Lyrica [Pregabalin] Other (See Comments)    Made patient very lethargic the next morning, very hard to patient to function, move, etc.    Statins Other (See Comments)    Causes Restless Legs, rhabdomyolysis   Ativan [Lorazepam] Other (See Comments)    Markedly increased restless legs   Trulicity [Dulaglutide] Other (See Comments)    GI side effects     Immunization History  Administered Date(s) Administered   Pneumococcal Conjugate-13 12/01/2013   Td 03/04/1998, 03/01/2017   Tdap 03/06/2011, 02/28/2017    Past Medical History:  Diagnosis Date   Arthritis    "left thumb" (05/23/2017) right shoulder   Barrett's esophagus    "we've been told that it's all gone; still take RX for GERD" (05/23/2017)   Bilateral swelling of feet and ankles x 3 weeks as of 12-01-2019   CAD (coronary artery disease), native coronary artery    Coronary angiogram 05/03/2014:  Proximal LAD 3.0x12 mm Promus premier stent.  04/08/2014: Mid Cx 3.5 x 16 mm Promus DES.  03/29/2013: Mid LAD 2.75 x 38 mm Promus Premier drug-eluting stent, balloon angioplasty of D1 and distal LAD and stenting of distal RCA with 2.75 x 24 mm promos Premier drug-eluting stent 12/15/2012 widely patent.   Cervicalgia    Chronic bronchitis (HCC)    "q yr" (05/23/2017)    Complication of anesthesia    " I do not wake up very well "  slow to wake up   COVID-19 05/2019   all covid symptoms in hospital x 5 days, all symptoms resolved took monoclonal antibody tx    Dyspnea    with exertion   Family history of adverse reaction to anesthesia    "son w/PONV"   GERD (gastroesophageal reflux disease)    Heart murmur    "grew out of it"    Hyperlipidemia    Hypertension    Hypoglycemia, unspecified    IDDM (insulin dependent diabetes mellitus)    type 2   Impotence of organic origin  Iron deficiency anemia    Memory loss    mild   Other malaise and fatigue    Pneumonia 05/2019   PONV (postoperative nausea and vomiting)    after wisdom teeth pulled, no problems with other surgeries   Restless leg    Rotator cuff arthropathy    Stroke ([place])    Tension headache    "sometimes" (05/23/2017)   Unspecified gastritis and gastroduodenitis with hemorrhage    Unstable angina pectoris ([place]) 04/08/2014   Coronary angiogram 05/03/2014:  Proximal LAD 3.0x12 mm Promus premier stent.  04/08/2014: Mid Cx 3.5 x 16 mm Promus DES.  03/29/2013: Mid LAD 2.75 x 38 mm Promus Premier drug-eluting stent, balloon angioplasty of D1 and distal LAD and stenting of distal RCA with 2.75 x 24 mm promos Premier drug-eluting stent 12/15/2012 widely patent.    Tobacco History: Social History   Tobacco Use  Smoking Status Never  Smokeless Tobacco Never   Counseling given: Not Answered   Outpatient Medications Prior to Visit  Medication Sig Dispense Refill   acetaminophen (TYLENOL) 500 MG tablet Take 500 mg by mouth every 6 (six) hours as needed for mild pain, moderate pain, headache or fever.     albuterol (VENTOLIN HFA) 108 (90 Base) MCG/ACT inhaler TAKE 2 PUFFS BY MOUTH EVERY 6 HOURS AS NEEDED FOR WHEEZE OR SHORTNESS OF BREATH 6.7 each 2   aspirin EC 81 MG tablet Take 1 tablet (81 mg total) by mouth daily. Swallow whole. 90 tablet 3   BD PEN NEEDLE NANO 2ND GEN 32G X 4 MM MISC USE AS  DIRECTED 100 each 3   benzonatate (TESSALON PERLES) 100 MG capsule Take 2 capsules (200 mg total) by mouth 3 (three) times daily as needed for cough. 30 capsule 0   Budeson-Glycopyrrol-Formoterol (BREZTRI AEROSPHERE) 160-9-4.8 MCG/ACT AERO Inhale 2 puffs into the lungs in the morning and at bedtime. 5.9 g 0   clopidogrel (PLAVIX) 75 MG tablet Take 1 tablet (75 mg total) by mouth daily. 90 tablet 3   Continuous Blood Gluc Receiver (FREESTYLE LIBRE 14 DAY READER) DEVI Inject 1 Device as directed daily as needed. Use once daily as direct to check blood sugar E11.51 1 each 0   Continuous Blood Gluc Sensor (FREESTYLE LIBRE 14 DAY SENSOR) MISC USE TO TEST BLOOD SUGAR THREE TIMES DAILY. E11.51. E11.59 1 each 12   ferrous sulfate 325 (65 FE) MG EC tablet Take 1 tablet (325 mg total) by mouth daily. 30 tablet 11   fluticasone (FLONASE) 50 MCG/ACT nasal spray Place 1 spray into both nostrils daily as needed for allergies or rhinitis. 16 g 11   gabapentin (NEURONTIN) 100 MG capsule Take 3 capsules (300 mg total) by mouth 2 (two) times daily. 180 capsule 5   glucose 4 GM chewable tablet Chew 1 tablet (4 g total) by mouth as needed for low blood sugar. 50 tablet 12   insulin aspart (NOVOLOG FLEXPEN) 100 UNIT/ML FlexPen Inject 25-30 Units into the skin See admin instructions. Inject 30 units subcutaneously in the morning, inject 25 units subcutaneously with lunch & inject 25 units subcutaneously with dinner. 30 mL 2   Insulin Glargine (BASAGLAR KWIKPEN) 100 UNIT/ML Inject 30 Units into the skin at bedtime. 30 mL 3   isosorbide mononitrate (IMDUR) 60 MG 24 hr tablet TAKE 1 TABLET BY MOUTH EVERY DAY 90 tablet 1   LIFESCAN FINEPOINT LANCETS MISC Use to test for blood sugar three times daily dx: E11.51 300 each 1   metoprolol succinate (  TOPROL-XL) 25 MG 24 hr tablet TAKE 1 TABLET BY MOUTH DAILY. TAKE WITH OR IMMEDIATELY FOLLOWING A MEAL. 90 tablet 1   nitroGLYCERIN (NITROSTAT) 0.4 MG SL tablet PLACE 1 TABLET UNDER THE  TONGUE EVERY 5 MINUTES AS NEEDED FOR CHEST PAIN. 50 tablet 0   ONETOUCH ULTRA test strip USE TO TEST FOR BLOOD SUGAR THREE TIMES DAILY DX: E11.51 300 strip 11   pantoprazole (PROTONIX) 40 MG tablet TAKE ONE TABLET BY MOUTH ONCE DAILY FOR STOMACH 90 tablet 3   PRALUENT 150 MG/ML SOAJ INJECT 1 PEN INTO THE SKIN EVERY 14 (FOURTEEN) DAYS. 6 mL 1   pramipexole (MIRAPEX) 0.5 MG tablet Take 0.5 mg by mouth as needed.     Semaglutide,0.25 or 0.5MG /DOS, 2 MG/3ML SOPN Inject 0.5 Units into the skin once a week. 9 mL 3   valsartan (DIOVAN) 160 MG tablet TAKE 1 TABLET (160 MG TOTAL) BY MOUTH EVERY EVENING. 90 tablet 3   Facility-Administered Medications Prior to Visit  Medication Dose Route Frequency Provider Last Rate Last Admin   [COMPLETED] hydrALAZINE (APRESOLINE) injection 10 mg  10 mg Intravenous Once Adrian Prows, MD   10 mg at 05/01/21 1054     Review of Systems:   Constitutional: No night sweats, fevers, chills, or lassitude. +excessive daytime fatigue, weight gain HEENT: No headaches, difficulty swallowing, tooth/dental problems, or sore throat. No sneezing, itching, ear ache, nasal congestion, or post nasal drip CV:  No chest pain, orthopnea, PND, swelling in lower extremities, anasarca, dizziness, palpitations, syncope Resp: +shortness of breath with exertion (improved); snoring. No excess mucus or change in color of mucus. No productive or non-productive. No hemoptysis. No wheezing.  No chest wall deformity GI:  +heartburn, indigestion. No abdominal pain, nausea, vomiting, diarrhea, loss of appetite GU: No dysuria, change in color of urine, urgency or frequency.   Skin: No rash, lesions, ulcerations MSK:  No joint pain or swelling.   Neuro: No dizziness or lightheadedness. +restless leg Psych: No depression or anxiety. Mood stable.     Physical Exam:  BP 120/82   Pulse 68   Ht 5\' 8"  (1.727 m)   Wt 206 lb 12.8 oz (93.8 kg)   SpO2 97%   BMI 31.44 kg/m   GEN: Pleasant, interactive,  well-appearing; obese; in no acute distress HEENT:  Normocephalic and atraumatic. PERRLA. Sclera white. Nasal turbinates pink, moist and patent bilaterally. No rhinorrhea present. Oropharynx pink and moist, without exudate or edema. No lesions, ulcerations, or postnasal drip. Mallampati III NECK:  Supple w/ fair ROM. No JVD present. Normal carotid impulses w/o bruits. Thyroid symmetrical with no goiter or nodules palpated. No lymphadenopathy.   CV: RRR, no m/r/g, no peripheral edema. Pulses intact, +2 bilaterally. No cyanosis, pallor or clubbing. PULMONARY:  Unlabored, regular breathing. Clear bilaterally A&P w/o wheezes/rales/rhonchi. No accessory muscle use.  GI: BS present and normoactive. Soft, non-tender to palpation. No organomegaly or masses detected.  MSK: No erythema, warmth or tenderness. Cap refil <2 sec all extrem. No deformities or joint swelling noted.  Neuro: A/Ox3. No focal deficits noted.   Skin: Warm, no lesions or rashe Psych: Normal affect and behavior. Judgement and thought content appropriate.     Lab Results:  CBC    Component Value Date/Time   WBC 7.2 03/05/2022 1513   RBC 4.87 03/05/2022 1513   HGB 14.6 03/05/2022 1513   HGB 14.5 04/25/2021 0949   HCT 43.8 03/05/2022 1513   HCT 43.1 04/25/2021 0949   PLT 128 (L) 03/05/2022 1513  PLT 137 (L) 04/25/2021 0949   MCV 89.9 03/05/2022 1513   MCV 87 04/25/2021 0949   MCH 30.0 03/05/2022 1513   MCHC 33.3 03/05/2022 1513   RDW 12.5 03/05/2022 1513   RDW 12.8 04/25/2021 0949   LYMPHSABS 1.2 03/05/2022 1513   LYMPHSABS 1.5 02/10/2015 1355   MONOABS 0.7 03/05/2022 1513   EOSABS 0.1 03/05/2022 1513   EOSABS 0.1 02/10/2015 1355   BASOSABS 0.0 03/05/2022 1513   BASOSABS 0.0 02/10/2015 1355    BMET    Component Value Date/Time   NA 137 03/05/2022 1513   NA 140 07/06/2021 1504   K 4.5 03/05/2022 1513   CL 103 03/05/2022 1513   CO2 26 03/05/2022 1513   GLUCOSE 110 (H) 03/05/2022 1513   BUN 20 03/05/2022 1513    BUN 20 07/06/2021 1504   CREATININE 1.25 (H) 03/05/2022 1513   CREATININE 1.23 10/05/2021 0859   CALCIUM 9.2 03/05/2022 1513   GFRNONAA >60 03/05/2022 1513   GFRNONAA 58 (L) 06/30/2020 0808   GFRAA 67 06/30/2020 0808    BNP    Component Value Date/Time   BNP 51.4 07/06/2021 1504   BNP 52.7 04/25/2019 0233     Imaging:  No results found.  Semaglutide(0.25 or 0.5MG /DOS) SOPN 0.25 mg     Date Action Dose Route User   Discharged on 03/06/2022   Admitted on 03/05/2022   Discharged on 03/02/2022   Admitted on 03/02/2022   02/08/2022 1645 Given 0.25 mg Subcutaneous (Right Lower Abdomen) Erby Pian, CMA          Latest Ref Rng & Units 01/02/2022    1:11 PM  PFT Results  FVC-Pre L 3.66   FVC-Predicted Pre % 91   FVC-Post L 3.54   FVC-Predicted Post % 88   Pre FEV1/FVC % % 83   Post FEV1/FCV % % 88   FEV1-Pre L 3.03   FEV1-Predicted Pre % 103   FEV1-Post L 3.11   DLCO uncorrected ml/min/mmHg 20.61   DLCO UNC% % 85   DLCO corrected ml/min/mmHg 20.61   DLCO COR %Predicted % 85   DLVA Predicted % 97   TLC L 5.31   TLC % Predicted % 80   RV % Predicted % 68     No results found for: "NITRICOXIDE"      Assessment & Plan:   Restless leg syndrome 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 Discussed that untreated OSA can contribute to these symptoms. He will need an in lab study given the severity of his symptoms. Urgent split night ordered.   Patient Instructions  Given your symptoms and history, I am concerned that you may have sleep disordered breathing with sleep apnea. You will need a sleep study for further evaluation. Someone will contact you for scheduling.  We discussed how untreated sleep apnea puts an individual at risk for cardiac arrhthymias, pulm HTN, DM, stroke and increases their risk for daytime accidents. We also briefly reviewed treatment options including weight loss, side sleeping position, oral appliance, CPAP therapy or referral to ENT for  possible surgical options  Use extreme caution when driving and pull over if you become sleepy. Would encourage you to limit distance driving until we know better what's going on with your sleep.  Continue Albuterol inhaler 2 puffs every 6 hours as needed for shortness of breath or wheezing. Notify if symptoms persist despite rescue inhaler/neb use.  Continue Breztri 2 puffs Twice daily. Brush tongue and rinse mouth afterwards  Take 0.5 mg-1  mg of pramipexole as needed for RLS the night of your study   Follow up in 5-6 weeks with Dr. Wynona Neatlalere (new pt 30 min slot) or Katie Jleigh Striplin,NP. If symptoms do not improve or worsen, please contact office for sooner follow up or seek emergency care.    Excessive daytime sleepiness He has snoring, excessive daytime sleepiness, intractable RLS, restless sleep. BMI 31. Epworth 15. Given this,  I am concerned he could have sleep disordered breathing with obstructive sleep apnea. He will need sleep study for further evaluation.    - discussed how weight can impact sleep and risk for sleep disordered breathing - discussed options to assist with weight loss: combination of diet modification, cardiovascular and strength training exercises   - had an extensive discussion regarding the adverse health consequences related to untreated sleep disordered breathing - specifically discussed the risks for hypertension, coronary artery disease, cardiac dysrhythmias, cerebrovascular disease, and diabetes - lifestyle modification discussed   - discussed how sleep disruption can increase risk of accidents, particularly when driving - safe driving practices were discussed   Obesity (BMI 30-39.9) BMI 31. Reviewed correlation between obesity and OSA. Healthy weight loss encouraged.   Shortness of breath Stable symptoms. Normal PFTs recently. Given his history, question asthma component? He is currently using albuterol PRN and occasionally uses Breztri as well. Discussed role  of maintenance vs rescue. He will notify if symptoms worsen.   I spent 45 minutes of dedicated to the care of this patient on the date of this encounter to include pre-visit review of records, face-to-face time with the patient discussing conditions above, post visit ordering of testing, clinical documentation with the electronic health record, making appropriate referrals as documented, and communicating necessary findings to members of the patients care team.  Noemi ChapelKatherine V Keenen Roessner, NP 03/21/2022  Pt aware and understands NP's role.

## 2022-03-21 NOTE — Assessment & Plan Note (Signed)
He has snoring, excessive daytime sleepiness, intractable RLS, restless sleep. BMI 31. Epworth 15. Given this,  I am concerned he could have sleep disordered breathing with obstructive sleep apnea. He will need sleep study for further evaluation.    - discussed how weight can impact sleep and risk for sleep disordered breathing - discussed options to assist with weight loss: combination of diet modification, cardiovascular and strength training exercises   - had an extensive discussion regarding the adverse health consequences related to untreated sleep disordered breathing - specifically discussed the risks for hypertension, coronary artery disease, cardiac dysrhythmias, cerebrovascular disease, and diabetes - lifestyle modification discussed   - discussed how sleep disruption can increase risk of accidents, particularly when driving - safe driving practices were discussed

## 2022-03-21 NOTE — Patient Instructions (Signed)
Given your symptoms and history, I am concerned that you may have sleep disordered breathing with sleep apnea. You will need a sleep study for further evaluation. Someone will contact you for scheduling.  We discussed how untreated sleep apnea puts an individual at risk for cardiac arrhthymias, pulm HTN, DM, stroke and increases their risk for daytime accidents. We also briefly reviewed treatment options including weight loss, side sleeping position, oral appliance, CPAP therapy or referral to ENT for possible surgical options  Use extreme caution when driving and pull over if you become sleepy. Would encourage you to limit distance driving until we know better what's going on with your sleep.  Continue Albuterol inhaler 2 puffs every 6 hours as needed for shortness of breath or wheezing. Notify if symptoms persist despite rescue inhaler/neb use.  Continue Breztri 2 puffs Twice daily. Brush tongue and rinse mouth afterwards  Take 0.5 mg-1 mg of pramipexole as needed for RLS the night of your study   Follow up in 5-6 weeks with Dr. Ander Slade (new pt 30 min slot) or Katie Johney Perotti,NP. If symptoms do not improve or worsen, please contact office for sooner follow up or seek emergency care.

## 2022-03-26 ENCOUNTER — Ambulatory Visit: Payer: Medicare Other | Admitting: Cardiology

## 2022-03-26 ENCOUNTER — Encounter: Payer: Self-pay | Admitting: Cardiology

## 2022-03-26 VITALS — BP 158/77 | HR 68 | Resp 16 | Ht 68.0 in | Wt 202.4 lb

## 2022-03-26 DIAGNOSIS — E119 Type 2 diabetes mellitus without complications: Secondary | ICD-10-CM | POA: Diagnosis not present

## 2022-03-26 DIAGNOSIS — Z794 Long term (current) use of insulin: Secondary | ICD-10-CM | POA: Diagnosis not present

## 2022-03-26 DIAGNOSIS — I25118 Atherosclerotic heart disease of native coronary artery with other forms of angina pectoris: Secondary | ICD-10-CM | POA: Diagnosis not present

## 2022-03-26 DIAGNOSIS — I10 Essential (primary) hypertension: Secondary | ICD-10-CM | POA: Diagnosis not present

## 2022-03-26 MED ORDER — SEMAGLUTIDE (1 MG/DOSE) 4 MG/3ML ~~LOC~~ SOPN: 1.0000 mg | PEN_INJECTOR | SUBCUTANEOUS | 2 refills | Status: DC

## 2022-03-26 NOTE — Progress Notes (Signed)
Primary Physician/Referring:  Lauree Chandler, NP  Patient ID: Kenneth Lav Sr., male    DOB: Jun 21, 1950, 72 y.o.   MRN: [id number]  Chief Complaint  Patient presents with   Coronary Artery Disease   Obesity   Follow-up   DM   HPI:    Kenneth Martinson Sr.  is a 72 y.o. Caucasian male with coronary artery disease, with stents to his LAD, RCA and circumflex, remote balloon angioplasty to distal LAD and diagonal has remained patent,  hypertension, hyperlipidemia, GERD, Barrett's esophagus, statin intolerance, uncontrolled diabetes mellitus, severe restless leg syndrome.  He also had florid COVID 19 infection in February 2021.  Patient presents for 6-week follow-up visit.  He has not had any further episodes of chest pain and he has not used any sublingual nitroglycerin.  States that his restless leg has gotten severe to the point where he has been in the emergency room twice due to severe restless leg symptoms.  Past Medical History:  Diagnosis Date   Arthritis    "left thumb" (05/23/2017) right shoulder   Barrett's esophagus    "we've been told that it's all gone; still take RX for GERD" (05/23/2017)   Bilateral swelling of feet and ankles x 3 weeks as of 12-01-2019   CAD (coronary artery disease), native coronary artery    Coronary angiogram 05/03/2014:  Proximal LAD 3.0x12 mm Promus premier stent.  04/08/2014: Mid Cx 3.5 x 16 mm Promus DES.  03/29/2013: Mid LAD 2.75 x 38 mm Promus Premier drug-eluting stent, balloon angioplasty of D1 and distal LAD and stenting of distal RCA with 2.75 x 24 mm promos Premier drug-eluting stent 12/15/2012 widely patent.   Cervicalgia    Chronic bronchitis ([place])    "q yr" (9/38/1017)   Complication of anesthesia    " I do not wake up very well "  slow to wake up   COVID-19 05/2019   all covid symptoms in hospital x 5 days, all symptoms resolved took monoclonal antibody tx    Dyspnea    with exertion   Family history of adverse reaction to anesthesia    "son  w/PONV"   GERD (gastroesophageal reflux disease)    Heart murmur    "grew out of it"    Hyperlipidemia    Hypertension    Hypoglycemia, unspecified    IDDM (insulin dependent diabetes mellitus)    type 2   Impotence of organic origin    Iron deficiency anemia    Memory loss    mild   Other malaise and fatigue    Pneumonia 05/2019   PONV (postoperative nausea and vomiting)    after wisdom teeth pulled, no problems with other surgeries   Restless leg    Rotator cuff arthropathy    Stroke ([place])    Tension headache    "sometimes" (05/23/2017)   Unspecified gastritis and gastroduodenitis with hemorrhage    Unstable angina pectoris ([place]) 04/08/2014   Coronary angiogram 05/03/2014:  Proximal LAD 3.0x12 mm Promus premier stent.  04/08/2014: Mid Cx 3.5 x 16 mm Promus DES.  03/29/2013: Mid LAD 2.75 x 38 mm Promus Premier drug-eluting stent, balloon angioplasty of D1 and distal LAD and stenting of distal RCA with 2.75 x 24 mm promos Premier drug-eluting stent 12/15/2012 widely patent.   ROS  Review of Systems  Cardiovascular:  Positive for leg swelling (Occasional). Negative for chest pain (resolved), claudication, dyspnea on exertion and palpitations.  Neurological:  Negative for dizziness.    Objective  03/26/2022    9:41 AM 03/21/2022    9:11 AM 03/07/2022    2:31 PM  Vitals with BMI  Height 5\' 8"  5\' 8"  5\' 8"   Weight 202 lbs 6 oz 206 lbs 13 oz 197 lbs 8 oz  BMI 30.78 31.45 30.04  Systolic 158 120 161130  Diastolic 77 82 80  Pulse 68 68 67    Blood pressure (!) 158/77, pulse 68, resp. rate 16, height 5\' 8"  (1.727 m), weight 202 lb 6.4 oz (91.8 kg), SpO2 96 %. Body mass index is 30.77 kg/m.   Physical Exam Vitals reviewed.  Constitutional:      Appearance: He is well-developed.  Neck:     Vascular: No carotid bruit or JVD.  Cardiovascular:     Rate and Rhythm: Normal rate and regular rhythm.     Pulses:          Carotid pulses are 2+ on the right side and 2+ on the left  side.      Femoral pulses are 2+ on the right side and 2+ on the left side.      Dorsalis pedis pulses are 0 on the right side and 0 on the left side.       Posterior tibial pulses are 1+ on the right side and 0 on the left side.     Heart sounds: Murmur heard.     Blowing decrescendo early diastolic murmur is present with a grade of 2/4 at the upper right sternal border radiating to the apex.     No gallop.  Pulmonary:     Effort: Pulmonary effort is normal.     Breath sounds: Normal breath sounds.  Abdominal:     General: Bowel sounds are normal.     Palpations: Abdomen is soft.  Musculoskeletal:     Right lower leg: Edema (1+) present.     Left lower leg: Edema (1+) present.    Laboratory examination:   Recent Labs    01/01/22 0513 03/02/22 1854 03/05/22 1513  NA 139 139 137  K 4.1 4.1 4.5  CL 105 103 103  CO2 24 30 26   GLUCOSE 89 88 110*  BUN 18 18 20   CREATININE 1.28* 1.29* 1.25*  CALCIUM 9.3 10.0 9.2  GFRNONAA 59* 59* >60      Latest Ref Rng & Units 03/05/2022    3:13 PM 03/02/2022    6:54 PM 01/01/2022    5:13 AM  CMP  Glucose 70 - 99 mg/dL 096110  88  89   BUN 8 - 23 mg/dL 20  18  18    Creatinine 0.61 - 1.24 mg/dL 0.451.25  4.091.29  8.111.28   Sodium 135 - 145 mmol/L 137  139  139   Potassium 3.5 - 5.1 mmol/L 4.5  4.1  4.1   Chloride 98 - 111 mmol/L 103  103  105   CO2 22 - 32 mmol/L 26  30  24    Calcium 8.9 - 10.3 mg/dL 9.2  91.410.0  9.3   Total Protein 6.5 - 8.1 g/dL 6.9     Total Bilirubin 0.3 - 1.2 mg/dL 0.9     Alkaline Phos 38 - 126 U/L 56     AST 15 - 41 U/L 44     ALT 0 - 44 U/L 41         Latest Ref Rng & Units 03/05/2022    3:13 PM 03/02/2022    6:54 PM 01/01/2022    5:13 AM  CBC  WBC 4.0 - 10.5 K/uL 7.2  7.7  7.1   Hemoglobin 13.0 - 17.0 g/dL 18.8  41.6  60.6   Hematocrit 39.0 - 52.0 % 43.8  45.6  45.4   Platelets 150 - 400 K/uL 128  125  146    Lipid Panel Recent Labs    01/21/22 0857  CHOL 150  TRIG 175*  LDLCALC 85  HDL 38*  CHOLHDL 3.9     HEMOGLOBIN A1C Lab Results  Component Value Date   HGBA1C 7.8 (A) 02/28/2022   MPG 186 10/05/2021   Lab Results  Component Value Date   TSH 2.684 01/15/2021     BNP (last 3 results) Recent Labs    07/06/21 1504  BNP 51.4    Allergies   Allergies  Allergen Reactions   Amlodipine Other (See Comments)    Worsening restless legs, dyspnea and leg swelling   Lyrica [Pregabalin] Other (See Comments)    Made patient very lethargic the next morning, very hard to patient to function, move, etc.    Statins Other (See Comments)    Causes Restless Legs, rhabdomyolysis   Ativan [Lorazepam] Other (See Comments)    Markedly increased restless legs   Trulicity [Dulaglutide] Other (See Comments)    GI side effects      Final Medications at End of Visit    Current Outpatient Medications:    acetaminophen (TYLENOL) 500 MG tablet, Take 500 mg by mouth every 6 (six) hours as needed for mild pain, moderate pain, headache or fever., Disp: , Rfl:    albuterol (VENTOLIN HFA) 108 (90 Base) MCG/ACT inhaler, TAKE 2 PUFFS BY MOUTH EVERY 6 HOURS AS NEEDED FOR WHEEZE OR SHORTNESS OF BREATH, Disp: 6.7 each, Rfl: 2   aspirin EC 81 MG tablet, Take 1 tablet (81 mg total) by mouth daily. Swallow whole., Disp: 90 tablet, Rfl: 3   BD PEN NEEDLE NANO 2ND GEN 32G X 4 MM MISC, USE AS DIRECTED, Disp: 100 each, Rfl: 3   benzonatate (TESSALON PERLES) 100 MG capsule, Take 2 capsules (200 mg total) by mouth 3 (three) times daily as needed for cough., Disp: 30 capsule, Rfl: 0   Budeson-Glycopyrrol-Formoterol (BREZTRI AEROSPHERE) 160-9-4.8 MCG/ACT AERO, Inhale 2 puffs into the lungs in the morning and at bedtime., Disp: 5.9 g, Rfl: 0   clopidogrel (PLAVIX) 75 MG tablet, Take 1 tablet (75 mg total) by mouth daily., Disp: 90 tablet, Rfl: 3   Continuous Blood Gluc Receiver (FREESTYLE LIBRE 14 DAY READER) DEVI, Inject 1 Device as directed daily as needed. Use once daily as direct to check blood sugar E11.51, Disp: 1 each,  Rfl: 0   Continuous Blood Gluc Sensor (FREESTYLE LIBRE 14 DAY SENSOR) MISC, USE TO TEST BLOOD SUGAR THREE TIMES DAILY. E11.51. E11.59, Disp: 1 each, Rfl: 12   diphenhydramine-acetaminophen (TYLENOL PM) 25-500 MG TABS tablet, Take 1 tablet by mouth at bedtime as needed., Disp: , Rfl:    ferrous sulfate 325 (65 FE) MG EC tablet, Take 1 tablet (325 mg total) by mouth daily., Disp: 30 tablet, Rfl: 11   fluticasone (FLONASE) 50 MCG/ACT nasal spray, Place 1 spray into both nostrils daily as needed for allergies or rhinitis., Disp: 16 g, Rfl: 11   gabapentin (NEURONTIN) 100 MG capsule, Take 3 capsules (300 mg total) by mouth 2 (two) times daily. (Patient taking differently: Take 300 mg by mouth at bedtime.), Disp: 180 capsule, Rfl: 5   glucose 4 GM chewable tablet, Chew 1 tablet (4 g  total) by mouth as needed for low blood sugar., Disp: 50 tablet, Rfl: 12   insulin aspart (NOVOLOG FLEXPEN) 100 UNIT/ML FlexPen, Inject 25-30 Units into the skin See admin instructions. Inject 30 units subcutaneously in the morning, inject 25 units subcutaneously with lunch & inject 25 units subcutaneously with dinner., Disp: 30 mL, Rfl: 2   Insulin Glargine (BASAGLAR KWIKPEN) 100 UNIT/ML, Inject 30 Units into the skin at bedtime., Disp: 30 mL, Rfl: 3   isosorbide mononitrate (IMDUR) 60 MG 24 hr tablet, TAKE 1 TABLET BY MOUTH EVERY DAY, Disp: 90 tablet, Rfl: 1   LIFESCAN FINEPOINT LANCETS MISC, Use to test for blood sugar three times daily dx: E11.51, Disp: 300 each, Rfl: 1   metoprolol succinate (TOPROL-XL) 25 MG 24 hr tablet, TAKE 1 TABLET BY MOUTH DAILY. TAKE WITH OR IMMEDIATELY FOLLOWING A MEAL., Disp: 90 tablet, Rfl: 1   nitroGLYCERIN (NITROSTAT) 0.4 MG SL tablet, PLACE 1 TABLET UNDER THE TONGUE EVERY 5 MINUTES AS NEEDED FOR CHEST PAIN., Disp: 50 tablet, Rfl: 0   ONETOUCH ULTRA test strip, USE TO TEST FOR BLOOD SUGAR THREE TIMES DAILY DX: E11.51, Disp: 300 strip, Rfl: 11   pantoprazole (PROTONIX) 40 MG tablet, TAKE ONE  TABLET BY MOUTH ONCE DAILY FOR STOMACH, Disp: 90 tablet, Rfl: 3   PRALUENT 150 MG/ML SOAJ, INJECT 1 PEN INTO THE SKIN EVERY 14 (FOURTEEN) DAYS., Disp: 6 mL, Rfl: 1   pramipexole (MIRAPEX) 0.5 MG tablet, Take 0.5 mg by mouth as needed., Disp: , Rfl:    Semaglutide, 1 MG/DOSE, 4 MG/3ML SOPN, Inject 1 mg into the skin once a week., Disp: 3 mL, Rfl: 2   valsartan (DIOVAN) 160 MG tablet, TAKE 1 TABLET (160 MG TOTAL) BY MOUTH EVERY EVENING., Disp: 90 tablet, Rfl: 3 No current facility-administered medications for this visit.  Facility-Administered Medications Ordered in Other Visits:    [COMPLETED] hydrALAZINE (APRESOLINE) injection 10 mg, 10 mg, Intravenous, Once, [name], MD, 10 mg at 05/01/21 1054   Radiology   No results found.   Cardiac Studies:   Lower Extremity Arterial Duplex 06/22/2020: No hemodynamically significant stenoses are identified in the right or left lower extremity arterial system. Mildly  abnormal biphasic waveform noted throughout the lower extremity. The right and left mid posterior tibial and mid anterior tibial arteries are non-compressible due to medial arteriosclerosis.   PCV ECHOCARDIOGRAM COMPLETE 01/04/2021 Normal LV systolic function with visual EF 60-65%. Left ventricle cavity is normal in size. Mild left ventricular hypertrophy. Normal global wall motion. Normal diastolic filling pattern, normal LAP. Mild (Grade I) aortic regurgitation. Mild tricuspid regurgitation. No evidence of pulmonary hypertension. Compared to study 06/22/2020 MR is now resolved otherwise no significant change.   Left Heart Catheterization 05/01/21:  LV 135/8, EDP 18 mmHg.  Ao 142/72, mean 104 mmHg.  No pressure gradient across the aortic valve. Left main: Not present LAD: Moderate to large vessel in the proximal segment however it is diffusely diseased.  Proximal 3.0 x 12 mm Promus DES placed 05/03/2014 widely patent.  Mid LAD 2.75 x 38 mm Promus Premier DES, balloon angioplasty of D1  and distal LAD performed 03/29/2013 is widely patent. CX: Large vessel, ostial circumflex 30 to 40% stenosis unchanged from prior catheterization, a 3.5 x 16 mm Promus DES placed 04/08/2014 is widely patent. RCA: Large vessel, mid segment has a diffuse long segment 99% stenosis.  Previously placed 2.74 x 24 mm Promus Premier DES on 12/15/2012 is widely patent.   Interventional data: Successful PTCA and stenting  of the mid and distal right coronary artery with 2 overlapping 2.5 x 38 mm and a 2.5 x 18 mm Onyx frontier DES, distal end of the stent overlapped 2.75 x 24 mm Promus Premier DES placed 12/15/2012.  TIMI II to TIMI-3 flow at the end of the procedure.    04/08/2014: 3.5 x 16 mm Promus DES  05/03/2014:  Proximal 3.0 x 12 mm Promus DES placed 05/03/2014.  Mid LAD 2.75 x 38 mm Promus Premier DES  05/01/2021: 2.5 x 38 mm and a 2.5 x 18 mm Onyx frontier DES, distal end of the stent overlapped to 2.75 x 24 mm Promus Premier DES placed 12/15/2012    PCV CARDIAC STRESS TEST 09/07/2021  Narrative Exercise treadmill stress test 09/07/2021: Exercise treadmill stress test performed using Bruce protocol.  Patient reached 7.7 METS, and 86% of age predicted maximum heart rate. Exercise capacity was fair. Normal heart rate response. Resting BP 130/82 mmHg, with hypertensive response, peak BP 240/80 mmHg.  No chest pain reported.  Dyspnea reported. Stress EKG revealed no ischemic changes. Low risk study.   EKG   EKG 02/08/2022: Normal sinus rhythm at the rate of 71 bpm, left axis deviation, left anterior fascicular block.  Poor R progression, LVH.  Baseline artifact, nonspecific T abnormality.  Compared to 08/06/2021, baseline artifact new.  Otherwise no change.   Assessment     ICD-10-CM   1. Coronary artery disease of native artery of native heart with stable angina pectoris (HCC)  I25.118 Semaglutide, 1 MG/DOSE, 4 MG/3ML SOPN    2. Diabetes mellitus type 2, insulin dependent (HCC)  E11.9 Semaglutide, 1  MG/DOSE, 4 MG/3ML SOPN   Z79.4     3. Reactive hypertension  I10      Meds ordered this encounter  Medications   Semaglutide, 1 MG/DOSE, 4 MG/3ML SOPN    Sig: Inject 1 mg into the skin once a week.    Dispense:  3 mL    Refill:  2     Medications Discontinued During This Encounter  Medication Reason   Semaglutide,0.25 or 0.5MG /DOS, 2 MG/3ML SOPN Dose change     Recommendations:   Kenneth Zavadil Sr.  is a 72 y.o. Caucasian male with coronary artery disease, with stents to his LAD, RCA and circumflex, remote balloon angioplasty to distal LAD and diagonal has remained patent,  hypertension, hyperlipidemia, GERD, Barrett's esophagus, statin intolerance, uncontrolled diabetes mellitus, severe restless leg syndrome.  He also had florid COVID 19 infection in February 2021.  1. Coronary artery disease of native artery of native heart with stable angina pectoris [facility]) Patient has not had any further episodes of chest pain.  Continue present medical management.  2.  Diabetes mellitus type 2, insulin dependent (HCC) He was prescribed Ozempic, previously had not picked it up however on his last office visit 6 weeks ago I started him on 0.25 mg today and clear-cut instructions given.  I have discussed with him to reduce his intake of food to avoid nausea and also to have small meals.  He has lost about 5 pounds in weight.  He is restless leg has got extremely severe now.  He wishes to continue to using Ozempic and does not attribute any side effect from Ozempic to restless leg which are 2 different entities.  I discussed with him that improved diabetes control will help with improving potential of peripheral neuropathy and neuronal damage due to hyperglycemia.  Will increase the dose from 0.5 mg to 1 mg and  I will request Sherrie Mustache to see whether she can continue to manage the diabetes along with weight loss going further, I will see him back in 6 months for follow-up.  He has reactive  hypertension, due to severe restless leg he has not slept all night last night.  Blood pressure recordings last several times have been under excellent control.  Hence I did not make any changes to his medications.   Adrian Prows, MD, [facility] 03/26/2022, 10:41 AM Office: [phone] Fax: [phone] Pager: [phone]

## 2022-04-01 ENCOUNTER — Telehealth: Payer: Self-pay

## 2022-04-01 NOTE — Telephone Encounter (Signed)
Called and left messages to call on both numbers.

## 2022-04-01 NOTE — Telephone Encounter (Signed)
-----  Message from Shellia Carwin, MD sent at 03/27/2022  7:51 AM EST ----- Regarding: Functional neurologic deficit referral Shirlean Berman,  We referred Mr Mirabal to Ray Church, a therapist in [place] who sees patients with functional neurologic disorders. I got the information from [facility]. Patient has not heard from them (it has been a few weeks). Can we check on this?  Thank you,  Kai Levins

## 2022-04-03 ENCOUNTER — Telehealth: Payer: Self-pay

## 2022-04-03 NOTE — Telephone Encounter (Signed)
-----  Message from Shellia Carwin, MD sent at 04/03/2022  7:37 AM EST ----- Regarding: Therapist? Joseph Art,  Mr. Goh sent me this message: Try this lady Olivia Mackie hill. Her number is [phone]. She is an [employer].  I cannot find this woman and what she does. Can you call and see who she is and if she sees patients with functional movement disorders?   Thank you,  Kai Levins, MD

## 2022-04-03 NOTE — Progress Notes (Signed)
NEUROLOGY FOLLOW UP OFFICE NOTE  Kenneth Ringenberg Sr. [id number]  Subjective:  Kenneth Bozzi Sr. is a 72 y.o. year old male arthritis, GERD, COPD, CAD, HTN, HLD, DM2, iron deficiency anemia, TIA (01/14/21 - left face droop and blurry vision) who we last saw on 03/06/22.  To briefly review: Patient started having restless legs syndrome. He describes needing to move his legs, then would improve after moving. It would happen worse at night and while in bed. He went untreated for many years. After his first stent in 2014, patient was put on Lipitor. He had such severe shaking, he required hospitalization and narcotics to stop his restless legs. He mentions having problems with his iron. He has taken iron supplementation and iron shots. He was not currently on any iron supplementation at initial visit on 10/22/21.   He has taken many different medications in the past, but is currently on gabapentin 200 mg at bedtime and pramipexole 1mg  at 5 pm, 1mg  at 7 pm, and 2mg  at bedtime (9-10 pm). Previously he did not take pramipexole later, but now requires it earlier and requires more medication. Symptoms continue to worsen though. He cannot sit down long or symptoms will get severe. He feels like he has to move to keep the symptoms at [place]. His symptoms have spread throughout the body now.   Patient was previously seen for restless leg syndrome (in 2015) by [name]. To briefly review, per history from clinic note on 09/29/13: "was admitted at [facility] last 09/18/2013 for leg pain due to severe RLS, found to have rhabdomyolysis with CK of 4907.  He was admitted for hydration and pain management.  He has had RLS for at least 20 years, initially affecting his legs, however since 2004 his arms have been involved as well.  He has been taking Mirapex for at least 10 years, and had been takin 0.75mg  dose 1-2 tablets at night.  This dose was causing him drowsiness.  He saw his PCP on 07/14 due to worsening RLS symptoms. He was tried  on Zanaflex and Mirapex was discontinued, which did not help.  He restarted Mirapex and had to take additional doses due to constant limb movements and pain, prompting him to go to the ER.  He was started on Gabapentin and clonazepam which did not help much, he was also given Dilaudid which quieted him down.  He was discharged home then returned to the ER due to persistently restless and painful legs bilaterally. He was described to be literally thrashing about the bed, able to answer questions but in significant distress. He tells me the symptoms would start in his right leg, leg would flex at the hip, then he stretches and leans back because the leg is jerking so violently for up to 1-1/2 hours. This eases off then starts on his left leg.  Walking and moving around helps.  Symptoms improved with pain medication. He saw his PCP a week ago and reported leg swelling, ?due to gabapentin. He was started on Sinemet 10/100mg  TID, which her reports has helped with the RLS symptoms, however wears off in the middle of the night. He would take 1/2 tab Mirapex when Sinemet wears off.  Since starting Sinemet, he reports weird dreams but no other side effects.  He had discontinued gabapentin, clonazepam, and Zanaflex.     He has infrequent headaches, dyspnea on exertion (climbing steps), occasional choking. Otherwise he denies any diplopia, dysarthria, focal numbness/tingling, bowel/bladder dysfunction.  He has a history  of C1 fracture at age 39 or 24. No known family history of similar RLS because he was adopted."   Patient has previously tried Mirapex and Sinemet as well as gabapentin, clonazepam, zanaflex, and Tramadol.   He endorses occasional discomfort in his legs and swelling of his legs.   He denies fevers, chills, or unexplained weight lost.   EtOH: none  Dietary restrictions: none Family hx of neurologic disorders: patient is adopted and not sure   At 01/02/22 visit, patient had dyskinesias thought to be  due to excess of pramipexole as he continued to take 3 mg nightly. He was instructed to decrease pramipexole. He described increasing movements continuing on 01/29/22 at which time patient was still taking up to 2 mg of pramipexole nightly. We again agreed to cut down on the pramipexole and take gabapentin instead of RLS symptoms.  He went to [facility] ED on 03/02/22, [facility] ED on 03/04/22, and [facility] again on 03/05/22 for the movements.   [facility] ED assessment/plan: [name] endorses a chronic longstanding history of restless leg syndrome which per history appears consistent with, also presents with years of abnormal uncontrollable movements that are worse at rest and specifically at nighttime. On history it appears the symptoms started sometime after being treated for restless leg syndrome with pramipexole. On exam there is no significant bradykinesia rigidity or postural action tremor. Abnormal movements involving his trunk and lower extremities are observed consistent and concerning for dyskinesia. Overall believe his presentation is consistent with likely a chronic history of restless leg syndrome and a secondary process of dyskinesia in the setting of chronic pramipexole use. Further workup and recommendations as below. - Stop taking pramipexole! - Continue home iron supplementations as tolerated - Continue Gabapentin, take 300 mg in AM, 300 mg at 12:00 PM (noon), 900 mg at night - They recommend follow up with [facility]. A referral has been placed, and you should be hearing from them.  - They recommend improving sleep hygiene with: Setting regular sleep hours, avoiding electronics in bed, practice visualization exercises if he cannot fall asleep, limit liquids prior to sleep, and eliminate all naps.    Patient mentioned that he can set his watch to his symptoms. He knows when he sits down to eat in the evening, he is going to be shaking. He will take medication then. After his meal, his symptoms  will get worse. He will take more medication. He will get worse throughout the night. He will have to keep moving, either on a treadmill or in his shop. This is help with symptoms. In the past, he would get drowsy then finally go to sleep, but not sleep all night. Wife mentions that these jerking movements have been present for years, but maybe worse now (since his 60s per wife). It has gotten worse over the last few months since our treatment plan began.    Wife provided a video from ED on 03/05/22 in which patient had intermittent quick jerking of all extremities, including in the head and neck. It was not rhythmic and without clear pattern. After getting permission from patient in writing (will be scanned into chart), I recorded the recording and patient's jerks today with office iPad.  Most recent Assessment and Plan (03/06/22): Kenneth Sic Sr. has a long history of RLS and found to have iron deficiency. Certainly this could explain some of patient's symptoms. On further evaluation today, movements are different than previous (more in arms and trunk at 01/02/22 visit,  more in legs today). 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 Withdrawal from pramipexole would not be expected to cause this jerking. Gabapentin can cause myoclonic jerks, but symptoms were present prior to gabapentin per wife and much more severe than would be expected. The whole body jerking is also not consistent with his previous RLS symptoms. With further history from wife today, it appears this jerking may have been present for longer than previously thought. There is no signs of parkinsonism on exam today and no dyskinesia/chorea to suggest medication or central process currently. 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 I explained this was not a structural (hardware) problem, but more  a functional (software) problem and that "retraining" the body is the best approach.   Plan: -Continue B12 supplementation -Continue iron supplementation  -Stop pramipexole -Discussed gabapentin, will continue 300 mg BID -Therapy for FND with Celene SquibbShannon Gaylord - referral placed today -Sleep medicine as previously planned to help with sleep  Since their last visit: Patient was having increasing RLS symptoms. He mentioned he was taking 1/2 tablet of pramipexole on 03/20/22, but not every night, and this was helping. I recommended he take 1/2 tablet of pramipexole nightly on 03/21/22 as movements I saw were no longer felt to be dyskinesias.   Overall, his symptoms are about the same. He has good days and bad days. He will go a couple of nights without sleeping well, and then will sleep well for a couple of days. Last night he walked from 7-9 pm. He slept at 9pm to 3am (waking to go to bathroom). He then slept until 5 am. That was a good night.  He is still attempting to get in touch with Celene SquibbShannon Gaylord to set up an appointment for FND, but has not yet been able to reach her.  He continues to take gabapentin 300 mg qhs and pramipexole 1/2 tablet (0.50 mg).   Patient went to sleep medicine who recommended a sleep study. He was told he had sleep apnea. He is unsure of follow up plans of now.  MEDICATIONS:  Outpatient Encounter Medications as of 04/10/2022  Medication Sig   acetaminophen (TYLENOL) 500 MG tablet Take 500 mg by mouth every 6 (six) hours as needed for mild pain, moderate pain, headache or fever.   albuterol (VENTOLIN HFA) 108 (90 Base) MCG/ACT inhaler TAKE 2 PUFFS BY MOUTH EVERY 6 HOURS AS NEEDED FOR WHEEZE OR SHORTNESS OF BREATH   aspirin EC 81 MG tablet Take 1 tablet (81 mg total) by mouth daily. Swallow whole.   BD PEN NEEDLE NANO 2ND GEN 32G X 4 MM MISC USE AS DIRECTED   benzonatate (TESSALON PERLES) 100 MG capsule Take 2 capsules (200 mg total) by mouth 3 (three) times daily as  needed for cough.   Budeson-Glycopyrrol-Formoterol (BREZTRI AEROSPHERE) 160-9-4.8 MCG/ACT AERO Inhale 2 puffs into the lungs in the morning and at bedtime.   clopidogrel (PLAVIX) 75 MG tablet Take 1 tablet (75 mg total) by mouth daily.   Continuous Blood Gluc Receiver (FREESTYLE LIBRE 14 DAY READER) DEVI Inject 1 Device as directed daily as needed. Use once daily as direct to check blood sugar E11.51   Continuous Blood Gluc Sensor (FREESTYLE LIBRE 14 DAY SENSOR) MISC USE TO TEST BLOOD SUGAR THREE TIMES DAILY. E11.51. E11.59   diphenhydramine-acetaminophen (TYLENOL PM) 25-500 MG TABS tablet Take 1 tablet by mouth at bedtime  as needed.   ferrous sulfate 325 (65 FE) MG EC tablet Take 1 tablet (325 mg total) by mouth daily.   fluticasone (FLONASE) 50 MCG/ACT nasal spray Place 1 spray into both nostrils daily as needed for allergies or rhinitis.   gabapentin (NEURONTIN) 100 MG capsule Take 3 capsules (300 mg total) by mouth 2 (two) times daily. (Patient taking differently: Take 300 mg by mouth at bedtime.)   glucose 4 GM chewable tablet Chew 1 tablet (4 g total) by mouth as needed for low blood sugar.   insulin aspart (NOVOLOG FLEXPEN) 100 UNIT/ML FlexPen Inject 25-30 Units into the skin See admin instructions. Inject 30 units subcutaneously in the morning, inject 25 units subcutaneously with lunch & inject 25 units subcutaneously with dinner.   Insulin Glargine (BASAGLAR KWIKPEN) 100 UNIT/ML Inject 30 Units into the skin at bedtime.   isosorbide mononitrate (IMDUR) 60 MG 24 hr tablet TAKE 1 TABLET BY MOUTH EVERY DAY   LIFESCAN FINEPOINT LANCETS MISC Use to test for blood sugar three times daily dx: E11.51   metoprolol succinate (TOPROL-XL) 25 MG 24 hr tablet TAKE 1 TABLET BY MOUTH DAILY. TAKE WITH OR IMMEDIATELY FOLLOWING A MEAL.   nitroGLYCERIN (NITROSTAT) 0.4 MG SL tablet PLACE 1 TABLET UNDER THE TONGUE EVERY 5 MINUTES AS NEEDED FOR CHEST PAIN.   ONETOUCH ULTRA test strip USE TO TEST FOR BLOOD SUGAR  THREE TIMES DAILY DX: E11.51   pantoprazole (PROTONIX) 40 MG tablet TAKE ONE TABLET BY MOUTH ONCE DAILY FOR STOMACH   PRALUENT 150 MG/ML SOAJ INJECT 1 PEN INTO THE SKIN EVERY 14 (FOURTEEN) DAYS.   pramipexole (MIRAPEX) 0.5 MG tablet Take 0.5 mg by mouth as needed.   Semaglutide, 1 MG/DOSE, 4 MG/3ML SOPN Inject 1 mg into the skin once a week.   valsartan (DIOVAN) 160 MG tablet TAKE 1 TABLET (160 MG TOTAL) BY MOUTH EVERY EVENING.   Facility-Administered Encounter Medications as of 04/10/2022  Medication   [COMPLETED] hydrALAZINE (APRESOLINE) injection 10 mg    PAST MEDICAL HISTORY: Past Medical History:  Diagnosis Date   Arthritis    "left thumb" (05/23/2017) right shoulder   Barrett's esophagus    "we've been told that it's all gone; still take RX for GERD" (05/23/2017)   Bilateral swelling of feet and ankles x 3 weeks as of 12-01-2019   CAD (coronary artery disease), native coronary artery    Coronary angiogram 05/03/2014:  Proximal LAD 3.0x12 mm Promus premier stent.  04/08/2014: Mid Cx 3.5 x 16 mm Promus DES.  03/29/2013: Mid LAD 2.75 x 38 mm Promus Premier drug-eluting stent, balloon angioplasty of D1 and distal LAD and stenting of distal RCA with 2.75 x 24 mm promos Premier drug-eluting stent 12/15/2012 widely patent.   Cervicalgia    Chronic bronchitis (HCC)    "q yr" (05/23/2017)   Complication of anesthesia    " I do not wake up very well "  slow to wake up   COVID-19 05/2019   all covid symptoms in hospital x 5 days, all symptoms resolved took monoclonal antibody tx    Dyspnea    with exertion   Family history of adverse reaction to anesthesia    "son w/PONV"   GERD (gastroesophageal reflux disease)    Heart murmur    "grew out of it"    Hyperlipidemia    Hypertension    Hypoglycemia, unspecified    IDDM (insulin dependent diabetes mellitus)    type 2   Impotence of organic origin  Iron deficiency anemia    Memory loss    mild   Other malaise and fatigue    Pneumonia  05/2019   PONV (postoperative nausea and vomiting)    after wisdom teeth pulled, no problems with other surgeries   Restless leg    Rotator cuff arthropathy    Stroke ([place])    Tension headache    "sometimes" (05/23/2017)   Unspecified gastritis and gastroduodenitis with hemorrhage    Unstable angina pectoris ([place]) 04/08/2014   Coronary angiogram 05/03/2014:  Proximal LAD 3.0x12 mm Promus premier stent.  04/08/2014: Mid Cx 3.5 x 16 mm Promus DES.  03/29/2013: Mid LAD 2.75 x 38 mm Promus Premier drug-eluting stent, balloon angioplasty of D1 and distal LAD and stenting of distal RCA with 2.75 x 24 mm promos Premier drug-eluting stent 12/15/2012 widely patent.    PAST SURGICAL HISTORY: Past Surgical History:  Procedure Laterality Date   BICEPT TENODESIS Right 12/07/2019   Procedure: RIGHT BICEPS TENODESIS;  Surgeon: Renette Butters, MD;  Location: [place];  Service: Orthopedics;  Laterality: Right;   CARDIAC CATHETERIZATION N/A 09/25/2015   Procedure: Left Heart Cath and Coronary Angiography;  Surgeon: Adrian Prows, MD;  Location: [place] CV LAB;  Service: Cardiovascular;  Laterality: N/A;   CARDIAC CATHETERIZATION N/A 09/25/2015   Procedure: Intravascular Pressure Wire/FFR Study;  Surgeon: Adrian Prows, MD;  Location: [place] [facility];  Service: Cardiovascular;  Laterality: N/A;   CORONARY ANGIOPLASTY WITH STENT PLACEMENT  12/15/2012; 04/28/2014; 05/03/2014   "2; 1; 1" , 4 stents 2 balloons   CORONARY STENT INTERVENTION N/A 05/01/2021   Procedure: CORONARY STENT INTERVENTION;  Surgeon: Adrian Prows, MD;  Location: [place] [facility];  Service: Cardiovascular;  Laterality: N/A;   FRACTIONAL FLOW RESERVE WIRE Right 03/26/2013   Procedure: FRACTIONAL FLOW RESERVE WIRE;  Surgeon: Laverda Page, MD;  Location: [facility] CATH LAB;  Service: Cardiovascular;  Laterality: Right;   FRACTIONAL FLOW RESERVE WIRE N/A 05/03/2014   Procedure: FRACTIONAL FLOW RESERVE WIRE;  Surgeon: Laverda Page, MD;  Location: [facility] CATH LAB;  Service: Cardiovascular;  Laterality: N/A;   HERNIA REPAIR  1062   "umbilical"    INCISION AND DRAINAGE ABSCESS Left 05/2007   "groin"    KNEE SURGERY Right 1992;  2000   "calcium deposits removed" (12/15/2012)   LEFT HEART CATH AND CORONARY ANGIOGRAPHY N/A 05/23/2017   Procedure: LEFT HEART CATH AND CORONARY ANGIOGRAPHY;  Surgeon: Adrian Prows, MD;  Location: [place] [facility];  Service: Cardiovascular;  Laterality: N/A;   LEFT HEART CATH AND CORONARY ANGIOGRAPHY N/A 05/01/2021   Procedure: LEFT HEART CATH AND CORONARY ANGIOGRAPHY;  Surgeon: Adrian Prows, MD;  Location: [place] CV LAB;  Service: Cardiovascular;  Laterality: N/A;   LEFT HEART CATHETERIZATION WITH CORONARY ANGIOGRAM N/A 12/15/2012   Procedure: LEFT HEART CATHETERIZATION WITH CORONARY ANGIOGRAM;  Surgeon: Laverda Page, MD;  Location: [facility] CATH LAB;  Service: Cardiovascular;  Laterality: N/A;   LEFT HEART CATHETERIZATION WITH CORONARY ANGIOGRAM N/A 03/26/2013   Procedure: LEFT HEART CATHETERIZATION WITH CORONARY ANGIOGRAM;  Surgeon: Laverda Page, MD;  Location: [facility] CATH LAB;  Service: Cardiovascular;  Laterality: N/A;   LEFT HEART CATHETERIZATION WITH CORONARY ANGIOGRAM N/A 04/08/2014   Procedure: LEFT HEART CATHETERIZATION WITH CORONARY ANGIOGRAM;  Surgeon: Blane Ohara, MD;  Location: [facility] CATH LAB;  Service: Cardiovascular;  Laterality: N/A;   MOUTH SURGERY Right    PERCUTANEOUS CORONARY STENT INTERVENTION (PCI-S) N/A 05/03/2014   Procedure: PERCUTANEOUS CORONARY  STENT INTERVENTION (PCI-S);  Surgeon: Pamella Pert, MD;  Location: [facility] CATH LAB;  Service: Cardiovascular;  Laterality: N/A;   RADIOLOGY WITH ANESTHESIA N/A 02/08/2021   Procedure: MRI WITH ANESTHESIA OF ABDOMEN WITH AND WITHOUT CONSTRAST;  Surgeon: Radiologist, Medication, MD;  Location: [facility] OR;  Service: Radiology;  Laterality: N/A;   ROTATOR CUFF REPAIR  2022   UMBILICAL HERNIA REPAIR  yrs ago     ALLERGIES: Allergies  Allergen Reactions   Amlodipine Other (See Comments)    Worsening restless legs, dyspnea and leg swelling   Lyrica [Pregabalin] Other (See Comments)    Made patient very lethargic the next morning, very hard to patient to function, move, etc.    Statins Other (See Comments)    Causes Restless Legs, rhabdomyolysis   Ativan [Lorazepam] Other (See Comments)    Markedly increased restless legs   Trulicity [Dulaglutide] Other (See Comments)    GI side effects     FAMILY HISTORY: Family History  Adopted: Yes  Problem Relation Age of Onset   Emphysema Mother     SOCIAL HISTORY: Social History   Tobacco Use   Smoking status: Never   Smokeless tobacco: Never  Vaping Use   Vaping Use: Never used  Substance Use Topics   Alcohol use: No   Drug use: No   Social History   Social History Narrative   Right handed    Caffeine 2 cups daily   Winter time 4 cups daily   Lives family one level home    Semi Retired      Objective:  Vital Signs:  BP (!) 148/86   Pulse 70   Ht 5\' 8"  (1.727 m)   Wt 201 lb (91.2 kg)   SpO2 97%   BMI 30.56 kg/m   General: No acute distress.  Patient appears well-groomed.   Head:  Normocephalic/atraumatic Neck: supple, no paraspinal tenderness, full range of motion Heart:  Regular rate and rhythm Lungs:  Clear to auscultation bilaterally Back: No paraspinal tenderness Neurological Exam: alert and oriented to person, place, and time.  Speech fluent and not dysarthric, language intact.  CN II-XII intact. Bulk and tone normal, muscle strength 5/5 throughout.  Sensation to light touch intact.  Deep tendon reflexes 2+ throughout, toes downgoing.  Finger to nose testing intact.  Gait normal, Romberg negative. Intermittent, distractible jerks of bilateral lower extremities (non-rhythmic adduction)  Labs and Imaging review: New results: Sleep study from 04/08/22: results pending  Previously reviewed results: HbA1c (02/28/22):  7.8   10/22/21: Ferritin 21 B12: 248 Normal or unremarkable: Vit D CMP significant for elevated glucose (193) and BUN (28) CBC significant for platelets of 110 (chronic)   EMG (03/23/14): Normal study of RUE and RLE (no myopathy, radiculopathy, or peripheral neuropathy).   MRI brain, MRA head and neck (01/14/21): IMPRESSION: MRI HEAD:   Normal brain MRI for age. No acute intracranial infarct or other abnormality identified.   MRA HEAD:   Normal intracranial MRA. No large vessel occlusion or hemodynamically significant stenosis. No aneurysm.   MRA NECK:   1. 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 The distal right V4 segment is patent on corresponding MRA head portion of this exam with perfusion of the right PICA. 2. Widely patent dominant left vertebral artery. 3. Wide patency of both carotid artery systems within the neck.  Assessment/Plan:  Kenneth Lipscomb Sr. is a 72 y.o. male who presents for evaluation of abnormal movements and  restless leg syndrome. He has a relevant medical history of restless leg syndrome, iron deficiency, arthritis, COPD, CAD, HTN, HLD, and DM2. He has a long history of RLS and found to have iron deficiency. This is contributing to his symptoms. He has also recently been found to have OSA per his report of his sleep study (report still pending) which may be contributing to limb movements and poor sleep.  Previous examinations have shown inconsistent and changing characteristics of movements. 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 Withdrawal from pramipexole would not be expected to cause the odd jerking seen in the video. Gabapentin can cause myoclonic jerks, but symptoms were present prior to gabapentin per wife and much more severe than  would be expected. The whole body jerking is also not consistent with his previous RLS symptoms. With further history from wife on 03/06/22, it appears this jerking may have been present for longer than previously thought.   There continue to be no signs of parkinsonism on exam today and no dyskinesia/chorea to suggest medication or central process currently. We have previously discussed function neurologic deficits and functional movement disorder with patient (first on 03/06/22).   Overall, patient's symptoms are currently stable. He continues to struggle many nights with poor sleep as a result of symptoms.   Plan: -Recheck iron studies today -Continue B12 supplementation 1000 mcg daily -Continue iron supplementation (ferrous sulfate 325 mg) -Continue pramipexole 0.5 mg qhs -Increase gabapentin to 400 mg qhs (can go up to 600 mg qhs) -Therapy for FND with Ray Church - referral placed - patient has difficulty reaching but will continue to try -Continue to follow with sleep medicine for OSA and insomnia  Return to clinic in 3 months  Total time spent reviewing records, interview, history/exam, documentation, and coordination of care on day of encounter:  30 min  Kai Levins, MD

## 2022-04-03 NOTE — Telephone Encounter (Signed)
-----  Message from Shellia Carwin, MD sent at 03/27/2022  7:51 AM EST ----- Regarding: Functional neurologic deficit referral Tarea Skillman,  We referred Mr Crago to Ray Church, a therapist in [place] who sees patients with functional neurologic disorders. I got the information from [place]. Patient has not heard from them (it has been a few weeks). Can we check on this?  Thank you,  Kai Levins

## 2022-04-03 NOTE — Telephone Encounter (Signed)
[place] number there was no answer. The message said she was a Geophysicist/field seismologist.

## 2022-04-03 NOTE — Telephone Encounter (Signed)
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 I got the information from [place]. Patient has not heard from them (it has been a few weeks). Can we check on this?  Thank you,  Jeremy Hill  

## 2022-04-03 NOTE — Telephone Encounter (Signed)
Called and no answer and no call backs at this time.

## 2022-04-04 ENCOUNTER — Telehealth: Payer: Self-pay

## 2022-04-04 ENCOUNTER — Other Ambulatory Visit: Payer: Self-pay

## 2022-04-04 ENCOUNTER — Telehealth: Payer: Self-pay | Admitting: Neurology

## 2022-04-04 NOTE — Telephone Encounter (Signed)
The number and he is going to try and get her. I also called and left a message to her that we could not text or email Referral. Mr Riva will call us  back.

## 2022-04-04 NOTE — Telephone Encounter (Signed)
Called pt after seeing the Ray Church returned my call t0 the acess nurse at 547pm. Told him that she would take a call from him give him the

## 2022-04-04 NOTE — Telephone Encounter (Signed)
Pt called in returning a call about a therapist.

## 2022-04-05 NOTE — Telephone Encounter (Signed)
See other encounter. [name] has spoke with patient.

## 2022-04-08 ENCOUNTER — Ambulatory Visit (HOSPITAL_BASED_OUTPATIENT_CLINIC_OR_DEPARTMENT_OTHER): Payer: Medicare Other | Attending: Nurse Practitioner | Admitting: Pulmonary Disease

## 2022-04-08 DIAGNOSIS — G4719 Other hypersomnia: Secondary | ICD-10-CM

## 2022-04-08 DIAGNOSIS — G4733 Obstructive sleep apnea (adult) (pediatric): Secondary | ICD-10-CM | POA: Insufficient documentation

## 2022-04-08 DIAGNOSIS — G2581 Restless legs syndrome: Secondary | ICD-10-CM | POA: Diagnosis not present

## 2022-04-10 ENCOUNTER — Other Ambulatory Visit (INDEPENDENT_AMBULATORY_CARE_PROVIDER_SITE_OTHER): Payer: Medicare Other

## 2022-04-10 ENCOUNTER — Ambulatory Visit (INDEPENDENT_AMBULATORY_CARE_PROVIDER_SITE_OTHER): Payer: Medicare Other | Admitting: Neurology

## 2022-04-10 ENCOUNTER — Encounter: Payer: Self-pay | Admitting: Neurology

## 2022-04-10 VITALS — BP 148/86 | HR 70 | Ht 68.0 in | Wt 201.0 lb

## 2022-04-10 DIAGNOSIS — G259 Extrapyramidal and movement disorder, unspecified: Secondary | ICD-10-CM

## 2022-04-10 DIAGNOSIS — G2581 Restless legs syndrome: Secondary | ICD-10-CM

## 2022-04-10 DIAGNOSIS — E559 Vitamin D deficiency, unspecified: Secondary | ICD-10-CM

## 2022-04-10 DIAGNOSIS — E538 Deficiency of other specified B group vitamins: Secondary | ICD-10-CM

## 2022-04-10 DIAGNOSIS — E611 Iron deficiency: Secondary | ICD-10-CM | POA: Diagnosis not present

## 2022-04-10 NOTE — Patient Instructions (Addendum)
Plan: -Recheck iron studies today -Continue B12 supplementation 1000 mcg daily -Continue iron supplementation (ferrous sulfate 325 mg) daily -Continue pramipexole 0.5 mg at bedtime -Increase gabapentin to 400 mg at bedtime (can go up to 600 mg bedtime) -Therapy for functional neurologic deficit with Ray Church - continue trying to reach out to her for appointment -Continue to follow with sleep medicine for sleep apnea and insomnia  Return to clinic in 3 months  The physicians and staff at [facility] are committed to providing excellent care. You may receive a survey requesting feedback about your experience at our office. We strive to receive "very good" responses to the survey questions. If you feel that your experience would prevent you from giving the office a "very good " response, please contact our office to try to remedy the situation. We may be reached at [phone]. Thank you for taking the time out of your busy day to complete the survey.  Kenneth Levins, MD [facility]

## 2022-04-11 LAB — IRON,TIBC AND FERRITIN PANEL
%SAT: 41 % (calc) (ref 20–48)
Ferritin: 119 ng/mL (ref 24–380)
Iron: 110 ug/dL (ref 50–180)
TIBC: 270 mcg/dL (calc) (ref 250–425)

## 2022-04-17 ENCOUNTER — Ambulatory Visit: Payer: Medicare Other | Admitting: Pulmonary Disease

## 2022-04-19 ENCOUNTER — Telehealth: Payer: Self-pay | Admitting: Pulmonary Disease

## 2022-04-19 DIAGNOSIS — G4733 Obstructive sleep apnea (adult) (pediatric): Secondary | ICD-10-CM | POA: Diagnosis not present

## 2022-04-19 DIAGNOSIS — G2581 Restless legs syndrome: Secondary | ICD-10-CM

## 2022-04-19 NOTE — Telephone Encounter (Signed)
Call patient  Sleep study result  Date of study: 04/08/2021  Impression: Severe obstructive sleep apnea Restless legs Poor sleep quality  Recommendation:  Schedule for follow-up to further discuss sleep study result  Study was significantly limited by poor sleep quality that was affected significantly by patient's restless legs, hypoglycemia and interventions  There was presence of severe obstructive sleep apnea that may benefit from treatment  Auto CPAP 5-20 may be considered as an option of treatment.  Sleep quality may not significantly improve without optimal treatment of restless legs and associated symptoms.

## 2022-04-19 NOTE — Procedures (Signed)
POLYSOMNOGRAPHY  Last, First: [name], [name] MRN: [id number] Gender: Male Age (years): 41 Weight (lbs): 204 DOB: 01-09-1951 BMI: 31 Primary Care: No PCP Epworth Score: <br> Referring: Clayton Bibles NP Technician: Laren Everts Interpreting: Laurin Coder MD Study Type: Split Night CPAP Ordered Study Type: Split Night CPAP Study date: 04/08/2022 Location: [facility] CLINICAL INFORMATION [name] is a 72 year old Male and was referred to the sleep center for evaluation of G47.80 Other Sleep Disorders. Indications include Diabetes, Excessive Daytime Sleepiness, Fatigue, Obesity, Sleep walking/talking/parasomnias.  MEDICATIONS Patient self administered medications include: BABY ASPIRIN, GABAPENTIN, METOPROLOL SUCCINATE, PANTOPRAZOLE SODIUM, PLAVIX, PRAMIPEXOLE, TYLENOL PM, VALSARTAN. Medications administered during study include No sleep medicine administered.  SLEEP STUDY TECHNIQUE The patient underwent an attended overnight level one polysomnography titration to assess the effects of CPAP therapy. The following variables were monitored: EEG (C4-A1, C3-A2, O1-A2, O2-A1), EOG, submental and leg EMG, ECG, oxyhemoglobin saturation by pulse oximetry, thoracic and abdominal respiratory effort belts, nasal/oral airflow by pressure sensor, body position sensor and snoring sensor. CPAP pressure was titrated to eliminate apneas, hypopneas and oxygen desaturation. Hypopneas were scored per AASM definition IB (4% desaturation)  The NPSG portion of the study ended at 1:28:27 AM . The CPAP titration was initiated at 1:32:46 AM AM with the CPAP portion of the study ending at 2:21:10 AM.  TECHNICIAN COMMENTS Comments added by Technician: Patient had more than two awakenings to use the bathroom. Patient requested to end the study early due to severe pain from his restless legs. Comments added by Scorer: N/A SLEEP ARCHITECTURE The recording time for the entire night was 243.3 minutes. The  diagnostic portion was initiated at 10:17:52 PM and terminated at 1:28:27 AM. The time in bed was 190.6 minutes. EEG confirmed total sleep time was 137.9 minutes yielding a sleep efficiency of 72.3%. Sleep onset after lights out was 16.7 minutes with a REM latency of N/A minutes. The patient spent 23.6% of the night in stage N1 sleep, 76.4% in stage N2 sleep, 0.0% in stage N3 and 0% in REM. The Arousal Index was 53.1/hour.  The titration portion was initiated at 1:32:46 AM and terminated at 2:21:10 AM. The time in bed was 48.4 minutes. EEG confirmed total sleep time was 26.9 minutes yielding a sleep efficiency of 55.6%. Sleep onset after CPAP initiation was 0.0 minutes with a REM latency of N/A minutes. The patient spent 75.8% of the night in stage N1 sleep, 24.2% in stage N2 sleep, 0.0% in stage N3 and 0% in REM. The Arousal Index was 44.6/hour. RESPIRATORY PARAMETERS During the diagnostic portion, there were a total of 142 respiratory disturbances recorded; 3 apneas ( 0 obstructive, 1 mixed, 2 central), 114 hypopneas and 25 RERAs. The apnea/hypopnea index 50.9 was events/hour and the RDI was 61.8 events/hour. The central sleep apnea index was 0.9 events/hour. The REM AHI was N/A/h and NREM AHI was 50.9/h. The REM RDI was 0.0/h and NREM RDI was 61.8/h. The supine AHI was 70.9/h, and the non supine AHI was 3/h; supine during 70.6% of sleep. The supine RDI was 80.7 /h, and the non supine RDI was 16.30/h. Respiratory disturbances were associated with oxygen desaturation down to a nadir of 85.0% during sleep. The mean oxygen saturation during the study was 92.4%. The cumulative time under 88% oxygen saturation was 5.7 minutes.  During the titration portion, the apnea/hypopnea index (AHI) was 42.4 events/hour and the RDI was 53.5 events/hour. The central sleep apnea index was events/hour. The most appropriate setting of CPAP  was not determined. Respiratory events persisted at all settings. Supplemental oxygen was  not administered during the study. LEG MOVEMENT DATA The periodic limb movement index was 0.0/hour with an associated arousal index of /hour. CARDIAC DATA The underlying cardiac rhythm was most consistent with sinus rhythm. Mean heart rate was 66.8 during diagnostic portion and 72.1 during titration portion of study. Additional rhythm abnormalities include PVCs.  IMPRESSIONS - Severe Obstructive Sleep apnea(OSA) Sub-Optimal pressure attained. - Electrocardiographic data showed presence of PVCs. - No Significant Central Sleep Apnea (CSA) - Moderate Oxygen Desaturation - The patient snored with moderate snoring volume. - EEG did not show alpha intrusion. - No significant periodic leg movements(PLMs) during sleep. However, no significant associated arousals. - Reduced sleep efficiency, normal primary sleep latency, long REM sleep latency and no slow wave latency. - Sleep efficiency was significantly reduced by multiple episodes of hypoglycemia and intervention, leg cramping, pain and discomfort,  DIAGNOSIS - Obstructive Sleep Apnea (G47.33) - Restless legs  RECOMMENDATIONS - Consider scheduling for a titration study - Optimize management for restless legs. - Auto CPAP may be considered as an option of treatment with settings of 5-20. - Avoid alcohol, sedatives and other CNS depressants that may worsen sleep apnea and disrupt normal sleep architecture. - Sleep hygiene should be reviewed to assess factors that may improve sleep quality. - Weight management and regular exercise should be initiated or continued. - Return to Sleep Center for re-evaluation after 4 weeks of therapy  [Electronically signed] 04/19/2022 05:52 AM  Sherrilyn Rist MD NPI: [id number]

## 2022-04-22 DIAGNOSIS — S30860A Insect bite (nonvenomous) of lower back and pelvis, initial encounter: Secondary | ICD-10-CM | POA: Diagnosis not present

## 2022-04-25 NOTE — Telephone Encounter (Signed)
Spoke with patient regarding sleep study result's per Dr.Olalere   Call patient   Sleep study result   Date of study: 04/08/2021   Impression: Severe obstructive sleep apnea Restless legs Poor sleep quality   Recommendation:   Schedule for follow-up to further discuss sleep study result   Study was significantly limited by poor sleep quality that was affected significantly by patient's restless legs, hypoglycemia and interventions   There was presence of severe obstructive sleep apnea that may benefit from treatment   Auto CPAP 5-20 may be considered as an option of treatment.   Sleep quality may not significantly improve without optimal treatment of restless legs and associated symptoms.   Patient was scheduled a f/u on 04/30/2022.  Patient's voice was understanding nothing else further needed.

## 2022-04-30 ENCOUNTER — Ambulatory Visit (INDEPENDENT_AMBULATORY_CARE_PROVIDER_SITE_OTHER): Payer: Medicare Other | Admitting: Pulmonary Disease

## 2022-04-30 ENCOUNTER — Encounter: Payer: Self-pay | Admitting: Pulmonary Disease

## 2022-04-30 VITALS — BP 136/82 | HR 74 | Ht 68.0 in | Wt 195.5 lb

## 2022-04-30 DIAGNOSIS — G4733 Obstructive sleep apnea (adult) (pediatric): Secondary | ICD-10-CM

## 2022-04-30 NOTE — Progress Notes (Signed)
Kenneth Tapscott Sr.    [id number]    [birth year]  Primary Care Physician:Eubanks, Carlos American, NP  Referring Physician: Lauree Chandler, NP [place],  [place] [address]  Chief complaint:   Send being seen today for review of his sleep study  HPI:  Patient with history of severe restless legs who states that recently has had his medications optimized and he is sleeping better  Recently had a sleep study performed showing severe obstructive sleep apnea  He was concerned about the quality of his sleep during the study  Study was reviewed with the patient during the visit today He did show significantly reduced sleep efficiency, patient complained about a lot of discomfort during the study Was still able to sleep for about 137 minutes, showed severe obstructive sleep apnea with AHI of 41 a lot of restless legs noted  Patient states his restless legs is better controlled now With medication changes and optimization of his current medications  Has a longstanding history of insomnia both sleep onset and sleep maintenance insomnia  At present does get about 5 to 6 hours of sleep on his current regimen, his restless leg medications managed by his neurologist.  He previously was on pramipexole which did not help symptoms-currently on Neurontin  He has a medical history significant for mild pulmonary arterial hypertension, GERD, chronic kidney disease stage III, history of diabetes, coronary artery disease, hypertension, history of CVA  Goes to bed around 9 PM, falls asleep quickly, gets out of bed about 5 AM Has gained weight recently  Patient had a sleep study about 2 years ago-not aware of results  Pets: Occupation: Exposures: Smoking history: Travel history: Relevant family history:  Outpatient Encounter Medications as of 04/30/2022  Medication Sig   acetaminophen (TYLENOL) 500 MG tablet Take 500 mg by mouth every 6 (six) hours as needed for mild pain,  moderate pain, headache or fever.   albuterol (VENTOLIN HFA) 108 (90 Base) MCG/ACT inhaler TAKE 2 PUFFS BY MOUTH EVERY 6 HOURS AS NEEDED FOR WHEEZE OR SHORTNESS OF BREATH   aspirin EC 81 MG tablet Take 1 tablet (81 mg total) by mouth daily. Swallow whole.   BD PEN NEEDLE NANO 2ND GEN 32G X 4 MM MISC USE AS DIRECTED   benzonatate (TESSALON PERLES) 100 MG capsule Take 2 capsules (200 mg total) by mouth 3 (three) times daily as needed for cough.   Budeson-Glycopyrrol-Formoterol (BREZTRI AEROSPHERE) 160-9-4.8 MCG/ACT AERO Inhale 2 puffs into the lungs in the morning and at bedtime.   clopidogrel (PLAVIX) 75 MG tablet Take 1 tablet (75 mg total) by mouth daily.   Continuous Blood Gluc Receiver (FREESTYLE LIBRE 14 DAY READER) DEVI Inject 1 Device as directed daily as needed. Use once daily as direct to check blood sugar E11.51   Continuous Blood Gluc Sensor (FREESTYLE LIBRE 14 DAY SENSOR) MISC USE TO TEST BLOOD SUGAR THREE TIMES DAILY. E11.51. E11.59   diphenhydramine-acetaminophen (TYLENOL PM) 25-500 MG TABS tablet Take 1 tablet by mouth at bedtime as needed.   ferrous sulfate 325 (65 FE) MG EC tablet Take 1 tablet (325 mg total) by mouth daily.   fluticasone (FLONASE) 50 MCG/ACT nasal spray Place 1 spray into both nostrils daily as needed for allergies or rhinitis.   gabapentin (NEURONTIN) 100 MG capsule Take 3 capsules (300 mg total) by mouth 2 (two) times daily. (Patient taking differently: Take 300 mg by mouth at bedtime.)   glucose 4 GM  chewable tablet Chew 1 tablet (4 g total) by mouth as needed for low blood sugar.   insulin aspart (NOVOLOG FLEXPEN) 100 UNIT/ML FlexPen Inject 25-30 Units into the skin See admin instructions. Inject 30 units subcutaneously in the morning, inject 25 units subcutaneously with lunch & inject 25 units subcutaneously with dinner.   Insulin Glargine (BASAGLAR KWIKPEN) 100 UNIT/ML Inject 30 Units into the skin at bedtime.   isosorbide mononitrate (IMDUR) 60 MG 24 hr tablet  TAKE 1 TABLET BY MOUTH EVERY DAY   LIFESCAN FINEPOINT LANCETS MISC Use to test for blood sugar three times daily dx: E11.51   metoprolol succinate (TOPROL-XL) 25 MG 24 hr tablet TAKE 1 TABLET BY MOUTH DAILY. TAKE WITH OR IMMEDIATELY FOLLOWING A MEAL.   nitroGLYCERIN (NITROSTAT) 0.4 MG SL tablet PLACE 1 TABLET UNDER THE TONGUE EVERY 5 MINUTES AS NEEDED FOR CHEST PAIN.   ONETOUCH ULTRA test strip USE TO TEST FOR BLOOD SUGAR THREE TIMES DAILY DX: E11.51   pantoprazole (PROTONIX) 40 MG tablet TAKE ONE TABLET BY MOUTH ONCE DAILY FOR STOMACH   PRALUENT 150 MG/ML SOAJ INJECT 1 PEN INTO THE SKIN EVERY 14 (FOURTEEN) DAYS.   pramipexole (MIRAPEX) 0.5 MG tablet Take 0.5 mg by mouth as needed.   Semaglutide, 1 MG/DOSE, 4 MG/3ML SOPN Inject 1 mg into the skin once a week.   valsartan (DIOVAN) 160 MG tablet TAKE 1 TABLET (160 MG TOTAL) BY MOUTH EVERY EVENING.   Facility-Administered Encounter Medications as of 04/30/2022  Medication   [COMPLETED] hydrALAZINE (APRESOLINE) injection 10 mg    Allergies as of 04/30/2022 - Review Complete 04/10/2022  Allergen Reaction Noted   Amlodipine Other (See Comments) 08/06/2021   Lyrica [pregabalin] Other (See Comments) 09/18/2015   Other Other (See Comments) 03/01/2014   Statins Other (See Comments) 03/01/2014   Ativan [lorazepam] Other (See Comments) 0000000   Trulicity [dulaglutide] Other (See Comments) 03/02/2018    Past Medical History:  Diagnosis Date   Arthritis    "left thumb" (05/23/2017) right shoulder   Barrett's esophagus    "we've been told that it's all gone; still take RX for GERD" (05/23/2017)   Bilateral swelling of feet and ankles x 3 weeks as of 12-01-2019   CAD (coronary artery disease), native coronary artery    Coronary angiogram 05/03/2014:  Proximal LAD 3.0x12 mm Promus premier stent.  04/08/2014: Mid Cx 3.5 x 16 mm Promus DES.  03/29/2013: Mid LAD 2.75 x 38 mm Promus Premier drug-eluting stent, balloon angioplasty of D1 and distal LAD and  stenting of distal RCA with 2.75 x 24 mm promos Premier drug-eluting stent 12/15/2012 widely patent.   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) 04/08/2014   Coronary angiogram 05/03/2014:  Proximal LAD 3.0x12 mm Promus premier stent.  04/08/2014: Mid Cx 3.5 x 16 mm Promus DES.  03/29/2013: Mid LAD 2.75 x 38 mm Promus Premier drug-eluting stent, balloon angioplasty of D1 and distal LAD and stenting of distal RCA with 2.75 x 24 mm promos Premier drug-eluting stent 12/15/2012 widely patent.    Past Surgical History:  Procedure Laterality Date   BICEPT TENODESIS Right 12/07/2019   Procedure: RIGHT BICEPS TENODESIS;  Surgeon: Renette Butters, MD;  Location: [facility];  Service: Orthopedics;  Laterality: Right;   CARDIAC CATHETERIZATION N/A 09/25/2015   Procedure: Left Heart Cath and Coronary Angiography;  Surgeon: Adrian Prows, MD;  Location: [place] [facility];  Service: Cardiovascular;  Laterality: N/A;   CARDIAC CATHETERIZATION N/A 09/25/2015    Procedure: Intravascular Pressure Wire/FFR Study;  Surgeon: Adrian Prows, MD;  Location: [place] [facility];  Service: Cardiovascular;  Laterality: N/A;   CORONARY ANGIOPLASTY WITH STENT PLACEMENT  12/15/2012; 04/28/2014; 05/03/2014   "2; 1; 1" , 4 stents 2 balloons   CORONARY STENT INTERVENTION N/A 05/01/2021   Procedure: CORONARY STENT INTERVENTION;  Surgeon: Adrian Prows, MD;  Location: [place] [facility];  Service: Cardiovascular;  Laterality: N/A;   FRACTIONAL FLOW RESERVE WIRE Right 03/26/2013   Procedure: FRACTIONAL FLOW RESERVE WIRE;  Surgeon: Laverda Page, MD;  Location[facility] C[facility] LAB;  Service: Cardiovascular;  Laterality: Right;   FRACTIONAL FLOW RESERVE WIRE N/A 05/03/2014   Procedure: FRACTIONAL FLOW RESERVE WIRE;  Surgeon: Laverda Page, MD;  Location[facility] C[facility] LAB;  Service: Cardiovascular;  Laterality: N/A;   HERNIA REPAIR  [id number]   "umbilical"    INCISION AND DRAINAGE ABSCESS Left 05/2007   "groin"    KNEE SURGERY Right 1992;  2000   "calcium deposits removed" (12/15/2012)   LEFT HEART CATH AND CORONARY ANGIOGRAPHY N/A 05/23/2017   Procedure: LEFT HEART CATH AND CORONARY ANGIOGRAPHY;  Surgeon: Adrian Prows, MD;  Location: [place] [facility];  Service: Cardiovascular;  Laterality: N/A;   LEFT HEART CATH AND CORONARY ANGIOGRAPHY N/A 05/01/2021   Procedure: LEFT HEART CATH AND CORONARY ANGIOGRAPHY;  Surgeon: Adrian Prows, MD;  Location: [place] [facility];  Service: Cardiovascular;  Laterality: N/A;   LEFT HEART CATHETERIZATION WITH CORONARY ANGIOGRAM N/A 12/15/2012   Procedure: LEFT HEART CATHETERIZATION WITH CORONARY ANGIOGRAM;  Surgeon: Laverda Page, MD;  Location: [facility] CATH LAB;  Service: Cardiovascular;  Laterality: N/A;   LEFT HEART CATHETERIZATION WITH CORONARY ANGIOGRAM N/A 03/26/2013   Procedure: LEFT HEART CATHETERIZATION WITH CORONARY ANGIOGRAM;  Surgeon: Laverda Page, MD;  Location: [facility] CATH LAB;  Service: Cardiovascular;  Laterality: N/A;   LEFT HEART  CATHETERIZATION WITH CORONARY ANGIOGRAM N/A 04/08/2014   Procedure: LEFT HEART CATHETERIZATION WITH CORONARY ANGIOGRAM;  Surgeon: Blane Ohara, MD;  Location: [facility] CATH LAB;  Service: Cardiovascular;  Laterality: N/A;   MOUTH SURGERY Right    PERCUTANEOUS CORONARY STENT INTERVENTION (PCI-S) N/A 05/03/2014   Procedure: PERCUTANEOUS CORONARY STENT INTERVENTION (PCI-S);  Surgeon: Laverda Page, MD;  Location: [facility] CATH LAB;  Service: Cardiovascular;  Laterality: N/A;  RADIOLOGY WITH ANESTHESIA N/A 02/08/2021   Procedure: MRI WITH ANESTHESIA OF ABDOMEN WITH AND WITHOUT CONSTRAST;  Surgeon: Radiologist, Medication, MD;  Location: [place];  Service: Radiology;  Laterality: N/A;   ROTATOR CUFF REPAIR  [id number]   UMBILICAL HERNIA REPAIR  yrs ago    Family History  Adopted: Yes  Problem Relation Age of Onset   Emphysema Mother     Social History   Socioeconomic History   Marital status: Married    Spouse name: Pam   Number of children: 2   Years of education: 12   Highest education level: High school graduate  Occupational History   Occupation: Administrator    Comment: deliveries only now   Occupation: woodworking  Tobacco Use   Smoking status: Never   Smokeless tobacco: Never  Vaping Use   Vaping Use: Never used  Substance and Sexual Activity   Alcohol use: No   Drug use: No   Sexual activity: Not Currently  Other Topics Concern   Not on file  Social History Narrative   Right handed    Caffeine 2 cups daily   Winter time 4 cups daily   Lives family one level home    Semi Retired   Investment banker, operational of Health   Financial Resource Strain: [place]  (01/18/2021)   Overall Financial Resource Strain (CARDIA)    Difficulty of Paying Living Expenses: Not very hard  Food Insecurity: No Food Insecurity (01/18/2021)   Hunger Vital Sign    Worried About Running Out of Food in the Last Year: Never true    [place] in the Last Year: Never true  Transportation Needs: No  Transportation Needs (01/18/2021)   PRAPARE - Hydrologist (Medical): No    Lack of Transportation (Non-Medical): No  Physical Activity: Inactive (04/25/2017)   Exercise Vital Sign    Days of Exercise per Week: 0 days    Minutes of Exercise per Session: 0 min  Stress: No Stress Concern Present (04/25/2017)   [place]    Feeling of Stress : Only a little  Social Connections: Moderately Integrated (04/25/2017)   Social Connection and Isolation Panel [NHANES]    Frequency of Communication with Friends and Family: More than three times a week    Frequency of Social Gatherings with Friends and Family: More than three times a week    Attends Religious Services: More than 4 times per year    Active Member of [employer] or Organizations: No    Attends Archivist Meetings: Never    Marital Status: Married  Human resources officer Violence: Not At Risk (04/25/2017)   Humiliation, Afraid, Rape, and Kick questionnaire    Fear of Current or Ex-Partner: No    Emotionally Abused: No    Physically Abused: No    Sexually Abused: No    Review of Systems  Constitutional:  Positive for fatigue.  Respiratory:  Negative for shortness of breath.   Psychiatric/Behavioral:  Positive for sleep disturbance.     Vitals:   04/30/22 0923  BP: 136/82  Pulse: 74  SpO2: 97%     Physical Exam Constitutional:      Appearance: Normal appearance.  HENT:     Head: Normocephalic.     Mouth/Throat:     Mouth: Mucous membranes are moist.  Eyes:     General: No scleral icterus.    Pupils: Pupils are equal, round, and  reactive to light.  Cardiovascular:     Rate and Rhythm: Normal rate and regular rhythm.     Heart sounds: No murmur heard.    No friction rub.  Pulmonary:     Effort: No respiratory distress.     Breath sounds: No stridor. No wheezing or rhonchi.  Musculoskeletal:     Cervical back: No rigidity or  tenderness.  Neurological:     Mental Status: He is alert.  Psychiatric:        Mood and Affect: Mood normal.       03/21/2022    9:00 AM  Results of the Epworth flowsheet  Sitting and reading 3  Watching TV 1  Sitting, inactive in a public place (e.g. a theatre or a meeting) 2  As a passenger in a car for an hour without a break 3  Lying down to rest in the afternoon when circumstances permit 0  Sitting and talking to someone 2  Sitting quietly after a lunch without alcohol 3  In a car, while stopped for a few minutes in traffic 1  Total score 15   Data Reviewed: Sleep study was reviewed with the patient showing severe obstructive sleep apnea, evidence of restless legs  Assessment:  Severe obstructive sleep apnea  Restless leg syndrome -Symptoms are better controlled at present  Sleep onset and sleep maintenance insomnia  Pathophysiology of sleep disordered breathing was discussed with the patient  Options of treatment discussed with the patient  Risks with not treating sleep disordered breathing was discussed with the patient as well  Plan/Recommendations: He does have reservations about starting CPAP therapy but wants to at least give it a try  Will send in a prescription for auto CPAP 5-20 with heated humidification  Optimization of treatment with regular follow-up visits will be appropriate  With his underlying insomnia and significant restless leg syndrome, may be challenging to get him used to using CPAP on a regular basis but he does have severe enough sleep disordered breathing that treatment is recommended  Encouraged regular exercises  Tentative follow-up in about 4 weeks following initiation of CPAP therapy   Sherrilyn Rist MD [facility] 04/30/2022, 10:00 AM  CC: Lauree Chandler, NP

## 2022-04-30 NOTE — Patient Instructions (Signed)
DME referral for auto CPAP  Auto CPAP settings 5-20 with heated humidification  Tentative follow-up within 30 days of CPAP initiation  Call us with any significant concerns  Continue to optimize management of restless legs  CPAP does take a little bit of adjusting to

## 2022-05-05 DIAGNOSIS — J019 Acute sinusitis, unspecified: Secondary | ICD-10-CM | POA: Diagnosis not present

## 2022-05-05 DIAGNOSIS — H1033 Unspecified acute conjunctivitis, bilateral: Secondary | ICD-10-CM | POA: Diagnosis not present

## 2022-05-06 ENCOUNTER — Telehealth: Payer: Self-pay

## 2022-05-06 NOTE — Telephone Encounter (Signed)
Inbound fax from DME supplier requesting form be completed and faxed with clinical notes. DME supplies ordered via [employer] through online portal.  

## 2022-05-17 DIAGNOSIS — R059 Cough, unspecified: Secondary | ICD-10-CM | POA: Diagnosis not present

## 2022-05-17 DIAGNOSIS — J324 Chronic pansinusitis: Secondary | ICD-10-CM | POA: Diagnosis not present

## 2022-05-17 DIAGNOSIS — R0981 Nasal congestion: Secondary | ICD-10-CM | POA: Diagnosis not present

## 2022-05-20 ENCOUNTER — Ambulatory Visit (INDEPENDENT_AMBULATORY_CARE_PROVIDER_SITE_OTHER): Payer: Medicare Other | Admitting: Orthopedic Surgery

## 2022-05-20 ENCOUNTER — Encounter: Payer: Self-pay | Admitting: Orthopedic Surgery

## 2022-05-20 VITALS — BP 138/70 | HR 80 | Temp 97.8°F | Resp 16 | Ht 68.0 in | Wt 196.0 lb

## 2022-05-20 DIAGNOSIS — J302 Other seasonal allergic rhinitis: Secondary | ICD-10-CM | POA: Diagnosis not present

## 2022-05-20 DIAGNOSIS — J209 Acute bronchitis, unspecified: Secondary | ICD-10-CM | POA: Diagnosis not present

## 2022-05-20 MED ORDER — LEVOCETIRIZINE DIHYDROCHLORIDE 5 MG PO TABS: 5.0000 mg | ORAL_TABLET | Freq: Every evening | ORAL | 2 refills | Status: DC

## 2022-05-20 MED ORDER — BENZONATATE 100 MG PO CAPS: 200.0000 mg | ORAL_CAPSULE | Freq: Three times a day (TID) | ORAL | 0 refills | Status: DC | PRN

## 2022-05-20 NOTE — Progress Notes (Signed)
Careteam: Patient Care Team: Lauree Chandler, NP as PCP - General (Geriatric Medicine) Adrian Prows, MD as Consulting Physician (Cardiology) Cameron Sprang, MD as Consulting Physician (Neurology) [place], [name], [place] (Optometry) Melida Quitter, MD as Consulting Physician (Otolaryngology)  Seen by: Windell Moulding, AGNP-C  PLACE OF SERVICE:  [place] Directive information Does Patient Have a Medical Advance Directive?: No, Would patient like information on creating a medical advance directive?: No - Patient declined  Allergies  Allergen Reactions   Amlodipine Other (See Comments)    Worsening restless legs, dyspnea and leg swelling   Lyrica [Pregabalin] Other (See Comments)    Made patient very lethargic the next morning, very hard to patient to function, move, etc.    Other Other (See Comments)    Convulsions, Causes Restless Legs, rhabdomyolysis   Statins Other (See Comments)    Causes Restless Legs, rhabdomyolysis   Ativan [Lorazepam] Other (See Comments)    Markedly increased restless legs   Trulicity [Dulaglutide] Other (See Comments)    GI side effects     Chief Complaint  Patient presents with   Acute Visit    Patient complains of coughing and hoarseness X 2 weeks.     HPI: Patient is a 72 y.o. male seen today for acute visit due to dry cough and hoarseness.   H/o simple bronchitis. Followed by pulmonary. 2 weeks he went to urgent care in [place] and prescribed Amoxicillin for sinus infection. 03/16 he went back to urgent care due to increased dry cough. He was goven steroid pill x 3 days. Today, dry cough has not resolved> worse at night. He also reports hoarse voice and itchy eyes. He is not on allergy medication. He has Flonase on his medication list but not using. 11/2021 pulmonary had prescribed albuterol prn and Breztri inhaler for simple chronic bronchitis and sob. He has not been using them at this time. Denies fever, chest pain or wheezing today. He  recently saw pulmonary 2 weeks ago due to newly diagnosed sleep apnea. CPAP has been ordered.   Review of Systems:  Review of Systems  Constitutional:  Negative for chills and fever.  HENT:  Positive for congestion. Negative for sinus pain and sore throat.   Respiratory:  Positive for cough. Negative for sputum production, shortness of breath and wheezing.   Cardiovascular:  Negative for chest pain and leg swelling.  Gastrointestinal:  Negative for nausea and vomiting.  Musculoskeletal:  Negative for myalgias.  Endo/Heme/Allergies:  Positive for environmental allergies.  Psychiatric/Behavioral:  Negative for depression. The patient is not nervous/anxious.     Past Medical History:  Diagnosis Date   Arthritis    "left thumb" (05/23/2017) right shoulder   Barrett's esophagus    "we've been told that it's all gone; still take RX for GERD" (05/23/2017)   Bilateral swelling of feet and ankles x 3 weeks as of 12-01-2019   CAD (coronary artery disease), native coronary artery    Coronary angiogram 05/03/2014:  Proximal LAD 3.0x12 mm Promus premier stent.  04/08/2014: Mid Cx 3.5 x 16 mm Promus DES.  03/29/2013: Mid LAD 2.75 x 38 mm Promus Premier drug-eluting stent, balloon angioplasty of D1 and distal LAD and stenting of distal RCA with 2.75 x 24 mm promos Premier drug-eluting stent 12/15/2012 widely patent.   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  04/08/2014: Mid Cx 3.5 x 16 mm Promus DES.  03/29/2013: Mid LAD 2.75 x 38 mm Promus Premier drug-eluting stent, balloon angioplasty of D1 and distal LAD and stenting of distal RCA with 2.75 x 24 mm promos Premier drug-eluting stent 12/15/2012 widely patent.   Past Surgical History:  Procedure Laterality Date   BICEPT TENODESIS Right 12/07/2019   Procedure: RIGHT BICEPS TENODESIS;  Surgeon: Renette Butters, MD;  Location: [facility];  Service: Orthopedics;  Laterality: Right;   CARDIAC CATHETERIZATION N/A 09/25/2015   Procedure: Left Heart Cath and Coronary Angiography;  Surgeon: Adrian Prows, MD;  Location: [place] [facility];  Service: Cardiovascular;  Laterality: N/A;   CARDIAC CATHETERIZATION N/A 09/25/2015   Procedure: Intravascular Pressure Wire/FFR Study;  Surgeon: Adrian Prows, MD;  Location: [place] [facility];  Service: Cardiovascular;  Laterality: N/A;   CORONARY ANGIOPLASTY WITH STENT PLACEMENT  12/15/2012; 04/28/2014; 05/03/2014   "2; 1; 1" , 4 stents 2 balloons   CORONARY STENT INTERVENTION N/A 05/01/2021   Procedure: CORONARY STENT INTERVENTION;  Surgeon: Adrian Prows, MD;  Location: [place] [facility];  Service: Cardiovascular;  Laterality: N/A;   FRACTIONAL FLOW RESERVE WIRE Right 03/26/2013   Procedure: FRACTIONAL FLOW RESERVE WIRE;  Surgeon: Laverda Page, MD;  Location: [facility] CATH LAB;  Service: Cardiovascular;  Laterality: Right;   FRACTIONAL FLOW RESERVE WIRE N/A 05/03/2014    Procedure: FRACTIONAL FLOW RESERVE WIRE;  Surgeon: Laverda Page, MD;  Location[facility] CATH LAB;  Service: Cardiovascular;  Laterality: N/A;   HERNIA REPAIR  [id number]   "umbilical"    INCISION AND DRAINAGE ABSCESS Left 05/2007   "groin"    KNEE SURGERY Right 1992;  2000   "calcium deposits removed" (12/15/2012)   LEFT HEART CATH AND CORONARY ANGIOGRAPHY N/A 05/23/2017   Procedure: LEFT HEART CATH AND CORONARY ANGIOGRAPHY;  Surgeon: Adrian Prows, MD;  Location: [place] [facility];  Service: Cardiovascular;  Laterality: N/A;   LEFT HEART CATH AND CORONARY ANGIOGRAPHY N/A 05/01/2021   Procedure: LEFT HEART CATH AND CORONARY ANGIOGRAPHY;  Surgeon: Adrian Prows, MD;  Location: [place] [facility];  Service: Cardiovascular;  Laterality: N/A;   LEFT HEART CATHETERIZATION WITH CORONARY ANGIOGRAM N/A 12/15/2012   Procedure: LEFT HEART CATHETERIZATION WITH CORONARY ANGIOGRAM;  Surgeon: Laverda Page, MD;  Location: [facility] CATH LAB;  Service: Cardiovascular;  Laterality: N/A;   LEFT HEART CATHETERIZATION WITH CORONARY ANGIOGRAM N/A 03/26/2013   Procedure: LEFT HEART CATHETERIZATION WITH CORONARY ANGIOGRAM;  Surgeon: Laverda Page, MD;  Location: [facility] CATH LAB;  Service: Cardiovascular;  Laterality: N/A;   LEFT HEART CATHETERIZATION WITH CORONARY ANGIOGRAM N/A 04/08/2014   Procedure: LEFT HEART CATHETERIZATION WITH CORONARY ANGIOGRAM;  Surgeon: Blane Ohara, MD;  Location: [facility] CATH LAB;  Service: Cardiovascular;  Laterality: N/A;   MOUTH SURGERY Right    PERCUTANEOUS CORONARY STENT INTERVENTION (PCI-S) N/A 05/03/2014   Procedure: PERCUTANEOUS CORONARY STENT INTERVENTION (PCI-S);  Surgeon: Laverda Page, MD;  Location: [facility] CATH LAB;  Service: Cardiovascular;  Laterality: N/A;   RADIOLOGY WITH ANESTHESIA N/A 02/08/2021   Procedure: MRI WITH ANESTHESIA OF ABDOMEN WITH AND WITHOUT CONSTRAST;  Surgeon: Radiologist, Medication, MD;  Location: [place];  Service: Radiology;  Laterality: N/A;   ROTATOR CUFF REPAIR   [id number]   UMBILICAL HERNIA REPAIR  yrs ago   Social History:   reports that he has never smoked. He has never used smokeless tobacco. He reports that he does not drink alcohol and does not use drugs.  Family History  Adopted: Yes  Problem Relation Age of Onset   Emphysema Mother     Medications: Patient's Medications  New Prescriptions   No medications on file  Previous Medications   ACETAMINOPHEN (TYLENOL) 500 MG TABLET    Take 500 mg by mouth every 6 (six) hours as needed for mild pain, moderate pain, headache or fever.   ALBUTEROL (VENTOLIN HFA) 108 (90 BASE) MCG/ACT INHALER    TAKE 2 PUFFS BY MOUTH EVERY 6 HOURS AS NEEDED FOR WHEEZE OR SHORTNESS OF BREATH   ASPIRIN EC 81 MG TABLET    Take 1 tablet (81 mg total) by mouth daily. Swallow whole.   BD PEN NEEDLE NANO 2ND GEN 32G X 4 MM MISC    USE AS DIRECTED   BENZONATATE (TESSALON PERLES) 100 MG CAPSULE    Take 2 capsules (200 mg total) by mouth 3 (three) times daily as needed for cough.   BUDESON-GLYCOPYRROL-FORMOTEROL (BREZTRI AEROSPHERE) 160-9-4.8 MCG/ACT AERO    Inhale 2 puffs into the lungs in the morning and at bedtime.   CLOPIDOGREL (PLAVIX) 75 MG TABLET    Take 1 tablet (75 mg total) by mouth daily.   CONTINUOUS BLOOD GLUC RECEIVER (FREESTYLE LIBRE 14 DAY READER) DEVI    Inject 1 Device as directed daily as needed. Use once daily as direct to check blood sugar E11.51   CONTINUOUS BLOOD GLUC SENSOR (FREESTYLE LIBRE 14 DAY SENSOR) MISC    USE TO TEST BLOOD SUGAR THREE TIMES DAILY. E11.51. E11.59   DIPHENHYDRAMINE-ACETAMINOPHEN (TYLENOL PM) 25-500 MG TABS TABLET    Take 1 tablet by mouth at bedtime as needed.   FERROUS SULFATE 325 (65 FE) MG EC TABLET    Take 1 tablet (325 mg total) by mouth daily.   FLUTICASONE (FLONASE) 50 MCG/ACT NASAL SPRAY    Place 1 spray into both nostrils daily as needed for allergies or rhinitis.   GABAPENTIN (NEURONTIN) 100 MG CAPSULE    Take 300-400 mg by mouth at bedtime.   GLUCOSE 4 GM CHEWABLE TABLET     Chew 1 tablet (4 g total) by mouth as needed for low blood sugar.   INSULIN ASPART (NOVOLOG FLEXPEN) 100 UNIT/ML FLEXPEN    Inject 25-30 Units into the skin See admin instructions. Inject 30 units subcutaneously in the morning, inject 25 units subcutaneously with  lunch & inject 25 units subcutaneously with dinner.   INSULIN GLARGINE (BASAGLAR KWIKPEN) 100 UNIT/ML    Inject 30 Units into the skin at bedtime.   ISOSORBIDE MONONITRATE (IMDUR) 60 MG 24 HR TABLET    TAKE 1 TABLET BY MOUTH EVERY DAY   LIFESCAN FINEPOINT LANCETS MISC    Use to test for blood sugar three times daily dx: E11.51   METOPROLOL SUCCINATE (TOPROL-XL) 25 MG 24 HR TABLET    TAKE 1 TABLET BY MOUTH DAILY. TAKE WITH OR IMMEDIATELY FOLLOWING A MEAL.   NITROGLYCERIN (NITROSTAT) 0.4 MG SL TABLET    PLACE 1 TABLET UNDER THE TONGUE EVERY 5 MINUTES AS NEEDED FOR CHEST PAIN.   ONETOUCH ULTRA TEST STRIP    USE TO TEST FOR BLOOD SUGAR THREE TIMES DAILY DX: E11.51   PANTOPRAZOLE (PROTONIX) 40 MG TABLET    TAKE ONE TABLET BY MOUTH ONCE DAILY FOR STOMACH   PRALUENT 150 MG/ML SOAJ    INJECT 1 PEN INTO THE SKIN EVERY 14 (FOURTEEN) DAYS.   PRAMIPEXOLE (MIRAPEX) 0.5 MG TABLET    Take 0.5 mg by mouth as needed.   SEMAGLUTIDE, 1 MG/DOSE, 4 MG/3ML SOPN    Inject 1 mg into the skin once a week.   VALSARTAN (DIOVAN) 160 MG TABLET    TAKE 1 TABLET (160 MG TOTAL) BY MOUTH EVERY EVENING.  Modified Medications   No medications on file  Discontinued Medications   GABAPENTIN (NEURONTIN) 100 MG CAPSULE    Take 3 capsules (300 mg total) by mouth 2 (two) times daily.    Physical Exam:  Vitals:   05/20/22 1420  BP: 138/70  Pulse: 80  Resp: 16  Temp: 97.8 F (36.6 C)  SpO2: 97%  Weight: 196 lb (88.9 kg)  Height: 5\' 8"  (1.727 m)   Body mass index is 29.8 kg/m. Wt Readings from Last 3 Encounters:  05/20/22 196 lb (88.9 kg)  04/30/22 195 lb 8 oz (88.7 kg)  04/10/22 201 lb (91.2 kg)    Physical Exam Vitals reviewed.  Constitutional:       General: He is not in acute distress. HENT:     Head: Normocephalic.     Right Ear: There is no impacted cerumen.     Left Ear: There is no impacted cerumen.     Nose:     Right Turbinates: Enlarged.     Left Turbinates: Enlarged.     Mouth/Throat:     Mouth: Mucous membranes are moist.     Pharynx: No oropharyngeal exudate or posterior oropharyngeal erythema.  Eyes:     General:        Right eye: No discharge.        Left eye: No discharge.  Cardiovascular:     Rate and Rhythm: Normal rate and regular rhythm.     Pulses: Normal pulses.     Heart sounds: Normal heart sounds.  Pulmonary:     Effort: Pulmonary effort is normal. No respiratory distress.     Breath sounds: Normal breath sounds. No wheezing or rales.  Abdominal:     General: Bowel sounds are normal.     Palpations: Abdomen is soft.  Musculoskeletal:     Cervical back: Neck supple.     Right lower leg: No edema.     Left lower leg: No edema.  Lymphadenopathy:     Cervical: No cervical adenopathy.  Skin:    General: Skin is warm.  Neurological:     General: No focal deficit  present.     Mental Status: He is alert and oriented to person, place, and time.  Psychiatric:        Behavior: Behavior normal.     Labs reviewed: Basic Metabolic Panel: Recent Labs    09/03/21 0826 10/05/21 0859 01/01/22 0513 01/01/22 0936 03/02/22 1854 03/05/22 1513  NA  --    < > 139  --  139 137  K  --    < > 4.1  --  4.1 4.5  CL  --    < > 105  --  103 103  CO2  --    < > 24  --  30 26  GLUCOSE  --    < > 89  --  88 110*  BUN  --    < > 18  --  18 20  CREATININE  --    < > 1.28*  --  1.29* 1.25*  CALCIUM  --    < > 9.3  --  10.0 9.2  MG 2.6*  --   --  2.1 2.2  --   PHOS  --   --   --   --  4.3  --    < > = values in this interval not displayed.   Liver Function Tests: Recent Labs    10/05/21 0859 10/22/21 1543 03/05/22 1513  AST 22 24 44*  ALT 28 30 41  ALKPHOS  --  54 56  BILITOT 0.5 0.6 0.9  PROT 6.4 6.6  6.9  ALBUMIN  --  4.1 4.1   No results for input(s): "LIPASE", "AMYLASE" in the last 8760 hours. No results for input(s): "AMMONIA" in the last 8760 hours. CBC: Recent Labs    10/05/21 0859 10/22/21 1543 01/01/22 0513 03/02/22 1854 03/05/22 1513  WBC 5.2   < > 7.1 7.7 7.2  NEUTROABS 3,557  --   --   --  5.1  HGB 13.0*   < > 15.7 15.7 14.6  HCT 39.4   < > 45.4 45.6 43.8  MCV 86.6   < > 86.8 89.1 89.9  PLT 133*   < > 146* 125* 128*   < > = values in this interval not displayed.   Lipid Panel: Recent Labs    01/21/22 0857  CHOL 150  HDL 38*  LDLCALC 85  TRIG 175*  CHOLHDL 3.9   TSH: No results for input(s): "TSH" in the last 8760 hours. A1C: Lab Results  Component Value Date   HGBA1C 7.8 (A) 02/28/2022     Assessment/Plan 1. Acute bronchitis, unspecified organism - ongoing - suspect associated with simple chronic bronchitis since it keeps returning - completed prednisone taper 03/2022 & 03/16> 3 tablets per urgent care - not taking Breztri inhaler - lung sounds clear today, afebrile - recommend taking inhalers as prescribed by pulmonary - may use tessalon pearls to help with symptoms  - advised to contact pulmonary if symptoms do not improve - benzonatate (TESSALON PERLES) 100 MG capsule; Take 2 capsules (200 mg total) by mouth 3 (three) times daily as needed for cough.  Dispense: 30 capsule; Refill: 0  2. Seasonal allergies - clear nasal drainage and watery eyes - start Xyzal 10 mg po qhs - levocetirizine (XYZAL) 5 MG tablet; Take 1 tablet (5 mg total) by mouth every evening.  Dispense: 30 tablet; Refill: 2  Total time: 21 minutes. Greater than 50% of total time spent doing patient education regarding bronchitis and allergies including symptom and medication  management.     Next appt: none [facility], [facility] [phone]

## 2022-05-20 NOTE — Patient Instructions (Signed)
Please check to see if you have BREZTRI inhaler> if not call pulmonary to refill  If symptoms do not improve> contact pulmonary  Change air filters in home  Xyzal is allergy medication> please take on pill at night > you may need to continue long term  Tessalon pearls for dry cough refilled

## 2022-05-22 ENCOUNTER — Telehealth: Payer: Self-pay | Admitting: Neurology

## 2022-05-22 NOTE — Telephone Encounter (Signed)
Pt called in and left a message with the access nurse. He stated he has restless leg and would like a referral to a therapist.

## 2022-05-23 ENCOUNTER — Other Ambulatory Visit: Payer: Self-pay

## 2022-05-23 NOTE — Telephone Encounter (Signed)
Patient is returning a call to renee.

## 2022-05-23 NOTE — Telephone Encounter (Signed)
Called pt and left message for [facility] a massage therapist is get information to send an order. Left message to call office

## 2022-05-23 NOTE — Telephone Encounter (Signed)
CALLED AND Andrius WAS IN [facility] AND IS GOING TO CALL BACK WITH INFORMATION TO THE THERAPIST.

## 2022-05-24 NOTE — Telephone Encounter (Signed)
Called Pt and he answer and then we were cut off. Called him back and answering machine picked up and I left a message per Waymon Budge the massage therapist that she does not take orders that he would just need to call her for an appointment because insurance doesn't pay for her services.  Her number is [phone].

## 2022-05-29 ENCOUNTER — Telehealth: Payer: Self-pay | Admitting: Pulmonary Disease

## 2022-05-29 NOTE — Telephone Encounter (Signed)
[name] would like office notes for [facility] 03/21/2022. [name] phone number is [phone]. Fax number is [phone].

## 2022-05-30 NOTE — Telephone Encounter (Signed)
ATC jodi at [facility]. [facility]. Office visit notes from 03/21/2022 have been printed and I will be faxing it over to them today.    Nothing further needed.

## 2022-06-03 ENCOUNTER — Other Ambulatory Visit: Payer: Self-pay | Admitting: Nurse Practitioner

## 2022-06-03 DIAGNOSIS — I10 Essential (primary) hypertension: Secondary | ICD-10-CM

## 2022-06-03 DIAGNOSIS — I25118 Atherosclerotic heart disease of native coronary artery with other forms of angina pectoris: Secondary | ICD-10-CM

## 2022-06-17 ENCOUNTER — Ambulatory Visit: Payer: Medicare Other | Admitting: Internal Medicine

## 2022-06-19 ENCOUNTER — Other Ambulatory Visit: Payer: Self-pay

## 2022-06-19 MED ORDER — CLOPIDOGREL BISULFATE 75 MG PO TABS: 75.0000 mg | ORAL_TABLET | Freq: Every day | ORAL | 3 refills | Status: DC

## 2022-07-10 ENCOUNTER — Ambulatory Visit: Payer: Medicare Other | Admitting: Internal Medicine

## 2022-07-10 NOTE — Progress Notes (Deleted)
Patient ID: Kenneth Denslow Sr., male   DOB: [birth year], 72 y.o.   MRN: [id number]  HPI: Kenneth Snarr Sr. is a 72 y.o.-year-old male, returning for follow-up for DM2, dx in 2016, insulin-dependent  since 2020, uncontrolled, with complications (CAD, history of CVA, CKD stage III). Pt. previously saw Dr. Everardo All, but last visit with me 4 months ago.  Interim history: No increased urination, blurry vision, nausea, chest pain. He lost 5 lbs before last visit and 6 more pounds since then  Reviewed HbA1c: Lab Results  Component Value Date   HGBA1C 7.8 (A) 02/28/2022   HGBA1C 8.1 (H) 10/05/2021   HGBA1C 8.3 (A) 05/07/2021   HGBA1C 7.9 (H) 01/15/2021   HGBA1C 8.1 (A) 12/25/2020   HGBA1C 8.6 (A) 09/14/2020   HGBA1C 8.7 (H) 06/30/2020   HGBA1C 8.6 (H) 02/22/2020   HGBA1C 8.8 (H) 10/18/2019   HGBA1C 7.6 (H) 06/14/2019   Previously on: - Basaglar 30 units at bedtime - Novolog 30-25-25 units (3x a day), immediately before meals: 10-20 units before B 15 units before L 15-20 unit before D He had nausea with metformin - stopped 2 mo ago. He tried Trulicity >> nausea.  Now on: - Basaglar 35 >> 30 units at bedtime - Novolog: 15 minutes before meals: 10-20 units before B 10-15 units before L 15-20 unit before D - Ozempic 0.25 >> 0.5 >> 1 mg weekly   Pt checks his sugars >4 a day with his CGM:  Previously:  Previously:  Lowest sugar was 46; he has hypoglycemia awareness at 70.  Highest sugar was 400.  Glucometer: One Touch ultra  He saw nutrition in the past and changed his diet afterwards.  - + CKD, last BUN/creatinine:  Lab Results  Component Value Date   BUN 20 03/05/2022   BUN 18 03/02/2022   CREATININE 1.25 (H) 03/05/2022   CREATININE 1.29 (H) 03/02/2022  On Diovan 80 mg daily.  -+HL; last set of lipids: Lab Results  Component Value Date   CHOL 150 01/21/2022   HDL 38 (L) 01/21/2022   LDLCALC 85 01/21/2022   LDLDIRECT 110 (H) 06/17/2019   TRIG 175 (H) 01/21/2022    CHOLHDL 3.9 01/21/2022  He is on Praluent.  He had side effects from statins in the past.  - last eye exam was 10/04/2021. No DR.   - +  restless leg syndrome.  He is on Neurontin. Also, on pramipexole for restless leg syndrome.  He sees neurology.  Last foot exam 10/22/2021.  He also has a history of HTN, GERD, Barrett's esophagus, iron deficiency anemia, memory loss.  ROS: + see HPI  Past Medical History:  Diagnosis Date   Arthritis    "left thumb" (05/23/2017) right shoulder   Barrett's esophagus    "we've been told that it's all gone; still take RX for GERD" (05/23/2017)   Bilateral swelling of feet and ankles x 3 weeks as of 12-01-2019   CAD (coronary artery disease), native coronary artery    Coronary angiogram 05/03/2014:  Proximal LAD 3.0x12 mm Promus premier stent.  04/08/2014: Mid Cx 3.5 x 16 mm Promus DES.  03/29/2013: Mid LAD 2.75 x 38 mm Promus Premier drug-eluting stent, balloon angioplasty of D1 and distal LAD and stenting of distal RCA with 2.75 x 24 mm promos Premier drug-eluting stent 12/15/2012 widely patent.   Cervicalgia    Chronic bronchitis (HCC)    "q yr" (05/23/2017)   Complication of anesthesia    " I do not wake up very  well "  slow to wake up   COVID-19 05/2019   all covid symptoms in hospital x 5 days, all symptoms resolved took monoclonal antibody tx    Dyspnea    with exertion   Family history of adverse reaction to anesthesia    "son w/PONV"   GERD (gastroesophageal reflux disease)    Heart murmur    "grew out of it"    Hyperlipidemia    Hypertension    Hypoglycemia, unspecified    IDDM (insulin dependent diabetes mellitus)    type 2   Impotence of organic origin    Iron deficiency anemia    Memory loss    mild   Other malaise and fatigue    Pneumonia 05/2019   PONV (postoperative nausea and vomiting)    after wisdom teeth pulled, no problems with other surgeries   Restless leg    Rotator cuff arthropathy    Stroke (HCC)    Tension headache     "sometimes" (05/23/2017)   Unspecified gastritis and gastroduodenitis with hemorrhage    Unstable angina pectoris (HCC) 04/08/2014   Coronary angiogram 05/03/2014:  Proximal LAD 3.0x12 mm Promus premier stent.  04/08/2014: Mid Cx 3.5 x 16 mm Promus DES.  03/29/2013: Mid LAD 2.75 x 38 mm Promus Premier drug-eluting stent, balloon angioplasty of D1 and distal LAD and stenting of distal RCA with 2.75 x 24 mm promos Premier drug-eluting stent 12/15/2012 widely patent.   Past Surgical History:  Procedure Laterality Date   BICEPT TENODESIS Right 12/07/2019   Procedure: RIGHT BICEPS TENODESIS;  Surgeon: Sheral Apley, MD;  Location: [facility];  Service: Orthopedics;  Laterality: Right;   CARDIAC CATHETERIZATION N/A 09/25/2015   Procedure: Left Heart Cath and Coronary Angiography;  Surgeon: [name], MD;  Location: [facility] INVASIVE CV LAB;  Service: Cardiovascular;  Laterality: N/A;   CARDIAC CATHETERIZATION N/A 09/25/2015   Procedure: Intravascular Pressure Wire/FFR Study;  Surgeon: [name], MD;  Location: [facility] INVASIVE CV LAB;  Service: Cardiovascular;  Laterality: N/A;   CORONARY ANGIOPLASTY WITH STENT PLACEMENT  12/15/2012; 04/28/2014; 05/03/2014   "2; 1; 1" , 4 stents 2 balloons   CORONARY STENT INTERVENTION N/A 05/01/2021   Procedure: CORONARY STENT INTERVENTION;  Surgeon: [name], MD;  Location: [facility] INVASIVE CV LAB;  Service: Cardiovascular;  Laterality: N/A;   FRACTIONAL FLOW RESERVE WIRE Right 03/26/2013   Procedure: FRACTIONAL FLOW RESERVE WIRE;  Surgeon: Pamella Pert, MD;  Location[facility] CATH LAB;  Service: Cardiovascular;  Laterality: Right;   FRACTIONAL FLOW RESERVE WIRE N/A 05/03/2014   Procedure: FRACTIONAL FLOW RESERVE WIRE;  Surgeon: Pamella Pert, MD;  Location: [facility] CATH LAB;  Service: Cardiovascular;  Laterality: N/A;   HERNIA REPAIR  2010   "umbilical"    INCISION AND DRAINAGE ABSCESS Left 05/2007   "groin"    KNEE SURGERY Right 1992;  2000   "calcium deposits  removed" (12/15/2012)   LEFT HEART CATH AND CORONARY ANGIOGRAPHY N/A 05/23/2017   Procedure: LEFT HEART CATH AND CORONARY ANGIOGRAPHY;  Surgeon: [name], MD;  Location: [facility] INVASIVE CV LAB;  Service: Cardiovascular;  Laterality: N/A;   LEFT HEART CATH AND CORONARY ANGIOGRAPHY N/A 05/01/2021   Procedure: LEFT HEART CATH AND CORONARY ANGIOGRAPHY;  Surgeon: [name], MD;  Location: [facility] INVASIVE CV LAB;  Service: Cardiovascular;  Laterality: N/A;   LEFT HEART CATHETERIZATION WITH CORONARY ANGIOGRAM N/A 12/15/2012   Procedure: LEFT HEART CATHETERIZATION WITH CORONARY ANGIOGRAM;  Surgeon: Pamella Pert, MD;  Location: [facility]  CATH LAB;  Service: Cardiovascular;  Laterality: N/A;   LEFT HEART CATHETERIZATION WITH CORONARY ANGIOGRAM N/A 03/26/2013   Procedure: LEFT HEART CATHETERIZATION WITH CORONARY ANGIOGRAM;  Surgeon: Pamella Pert, MD;  Location[facility] CATH LAB;  Service: Cardiovascular;  Laterality: N/A;   LEFT HEART CATHETERIZATION WITH CORONARY ANGIOGRAM N/A 04/08/2014   Procedure: LEFT HEART CATHETERIZATION WITH CORONARY ANGIOGRAM;  Surgeon: Micheline Chapman, MD;  Location: [facility] CATH LAB;  Service: Cardiovascular;  Laterality: N/A;   MOUTH SURGERY Right    PERCUTANEOUS CORONARY STENT INTERVENTION (PCI-S) N/A 05/03/2014   Procedure: PERCUTANEOUS CORONARY STENT INTERVENTION (PCI-S);  Surgeon: Pamella Pert, MD;  Location: [facility] CATH LAB;  Service: Cardiovascular;  Laterality: N/A;   RADIOLOGY WITH ANESTHESIA N/A 02/08/2021   Procedure: MRI WITH ANESTHESIA OF ABDOMEN WITH AND WITHOUT CONSTRAST;  Surgeon: Radiologist, Medication, MD;  Location: [facility] OR;  Service: Radiology;  Laterality: N/A;   ROTATOR CUFF REPAIR  2022   UMBILICAL HERNIA REPAIR  yrs ago   Social History   Socioeconomic History   Marital status: Married    Spouse name: Pam   Number of children: 2   Years of education: 12   Highest education level: High school graduate  Occupational History   Occupation: Naval architect     Comment: deliveries only now   Occupation: woodworking  Tobacco Use   Smoking status: Never   Smokeless tobacco: Never  Vaping Use   Vaping Use: Never used  Substance and Sexual Activity   Alcohol use: No   Drug use: No   Sexual activity: Not Currently  Other Topics Concern   Not on file  Social History Narrative   Right handed    Caffeine 2 cups daily   Winter time 4 cups daily   Lives family one level home    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   Financial Resource Strain: Low Risk  (01/18/2021)   Overall Financial Resource Strain (CARDIA)    Difficulty of Paying Living Expenses: Not very hard  Food Insecurity: No Food Insecurity (01/18/2021)   Hunger Vital Sign    Worried About Running Out of Food in the Last Year: Never true    Ran Out of Food in the Last Year: Never true  Transportation Needs: No Transportation Needs (01/18/2021)   PRAPARE - Administrator, Civil Service (Medical): No    Lack of Transportation (Non-Medical): No  Physical Activity: Inactive (04/25/2017)   Exercise Vital Sign    Days of Exercise per Week: 0 days    Minutes of Exercise per Session: 0 min  Stress: No Stress Concern Present (04/25/2017)   [employer] of Occupational Health - Occupational Stress Questionnaire    Feeling of Stress : Only a little  Social Connections: Moderately Integrated (04/25/2017)   Social Connection and Isolation Panel [NHANES]    Frequency of Communication with Friends and Family: More than three times a week    Frequency of Social Gatherings with Friends and Family: More than three times a week    Attends Religious Services: More than 4 times per year    Active Member of [employer] or Organizations: No    Attends Banker Meetings: Never    Marital Status: Married  Catering manager Violence: Not At Risk (04/25/2017)   Humiliation, Afraid, Rape, and Kick questionnaire    Fear of Current or Ex-Partner: No    Emotionally Abused: No     Physically Abused: No    Sexually Abused:  No   Current Outpatient Medications on File Prior to Visit  Medication Sig Dispense Refill   acetaminophen (TYLENOL) 500 MG tablet Take 500 mg by mouth every 6 (six) hours as needed for mild pain, moderate pain, headache or fever.     albuterol (VENTOLIN HFA) 108 (90 Base) MCG/ACT inhaler TAKE 2 PUFFS BY MOUTH EVERY 6 HOURS AS NEEDED FOR WHEEZE OR SHORTNESS OF BREATH 6.7 each 2   aspirin EC 81 MG tablet Take 1 tablet (81 mg total) by mouth daily. Swallow whole. 90 tablet 3   BD PEN NEEDLE NANO 2ND GEN 32G X 4 MM MISC USE AS DIRECTED 100 each 3   benzonatate (TESSALON PERLES) 100 MG capsule Take 2 capsules (200 mg total) by mouth 3 (three) times daily as needed for cough. 30 capsule 0   Budeson-Glycopyrrol-Formoterol (BREZTRI AEROSPHERE) 160-9-4.8 MCG/ACT AERO Inhale 2 puffs into the lungs in the morning and at bedtime. 5.9 g 0   clopidogrel (PLAVIX) 75 MG tablet Take 1 tablet (75 mg total) by mouth daily. 90 tablet 3   Continuous Blood Gluc Receiver (FREESTYLE LIBRE 14 DAY READER) DEVI Inject 1 Device as directed daily as needed. Use once daily as direct to check blood sugar E11.51 1 each 0   Continuous Blood Gluc Sensor (FREESTYLE LIBRE 14 DAY SENSOR) MISC USE TO TEST BLOOD SUGAR THREE TIMES DAILY. E11.51. E11.59 1 each 12   diphenhydramine-acetaminophen (TYLENOL PM) 25-500 MG TABS tablet Take 1 tablet by mouth at bedtime as needed.     ferrous sulfate 325 (65 FE) MG EC tablet Take 1 tablet (325 mg total) by mouth daily. 30 tablet 11   fluticasone (FLONASE) 50 MCG/ACT nasal spray Place 1 spray into both nostrils daily as needed for allergies or rhinitis. 16 g 11   gabapentin (NEURONTIN) 100 MG capsule Take 300-400 mg by mouth at bedtime.     glucose 4 GM chewable tablet Chew 1 tablet (4 g total) by mouth as needed for low blood sugar. 50 tablet 12   insulin aspart (NOVOLOG FLEXPEN) 100 UNIT/ML FlexPen Inject 25-30 Units into the skin See admin  instructions. Inject 30 units subcutaneously in the morning, inject 25 units subcutaneously with lunch & inject 25 units subcutaneously with dinner. 30 mL 2   Insulin Glargine (BASAGLAR KWIKPEN) 100 UNIT/ML Inject 30 Units into the skin at bedtime. 30 mL 3   isosorbide mononitrate (IMDUR) 60 MG 24 hr tablet TAKE 1 TABLET BY MOUTH EVERY DAY 90 tablet 1   levocetirizine (XYZAL) 5 MG tablet Take 1 tablet (5 mg total) by mouth every evening. 30 tablet 2   LIFESCAN FINEPOINT LANCETS MISC Use to test for blood sugar three times daily dx: E11.51 300 each 1   metoprolol succinate (TOPROL-XL) 25 MG 24 hr tablet TAKE 1 TABLET BY MOUTH EVERY DAY WITH OR IMMEDIATELY FOLLOWING A MEAL 90 tablet 1   nitroGLYCERIN (NITROSTAT) 0.4 MG SL tablet PLACE 1 TABLET UNDER THE TONGUE EVERY 5 MINUTES AS NEEDED FOR CHEST PAIN. 50 tablet 0   ONETOUCH ULTRA test strip USE TO TEST FOR BLOOD SUGAR THREE TIMES DAILY DX: E11.51 300 strip 11   pantoprazole (PROTONIX) 40 MG tablet TAKE ONE TABLET BY MOUTH ONCE DAILY FOR STOMACH 90 tablet 3   PRALUENT 150 MG/ML SOAJ INJECT 1 PEN INTO THE SKIN EVERY 14 (FOURTEEN) DAYS. 6 mL 1   pramipexole (MIRAPEX) 0.5 MG tablet Take 0.5 mg by mouth as needed.     Semaglutide, 1 MG/DOSE, 4 MG/3ML SOPN  Inject 1 mg into the skin once a week. 3 mL 2   valsartan (DIOVAN) 160 MG tablet TAKE 1 TABLET (160 MG TOTAL) BY MOUTH EVERY EVENING. 90 tablet 3   Current Facility-Administered Medications on File Prior to Visit  Medication Dose Route Frequency Provider Last Rate Last Admin   [COMPLETED] hydrALAZINE (APRESOLINE) injection 10 mg  10 mg Intravenous Once [name], MD   10 mg at 05/01/21 1054   Allergies  Allergen Reactions   Amlodipine Other (See Comments)    Worsening restless legs, dyspnea and leg swelling   Lyrica [Pregabalin] Other (See Comments)    Made patient very lethargic the next morning, very hard to patient to function, move, etc.    Other Other (See Comments)    Convulsions, Causes  Restless Legs, rhabdomyolysis   Statins Other (See Comments)    Causes Restless Legs, rhabdomyolysis   Ativan [Lorazepam] Other (See Comments)    Markedly increased restless legs   Trulicity [Dulaglutide] Other (See Comments)    GI side effects    Family History  Adopted: Yes  Problem Relation Age of Onset   Emphysema Mother    PE: There were no vitals taken for this visit. Wt Readings from Last 3 Encounters:  05/20/22 196 lb (88.9 kg)  04/30/22 195 lb 8 oz (88.7 kg)  04/10/22 201 lb (91.2 kg)   Constitutional: overweight, in NAD Eyes: no exophthalmos ENT: no thyromegaly, no cervical lymphadenopathy, + mild nonpitting B LE edema Cardiovascular: RRR, No MRG Respiratory: CTA B Musculoskeletal: no deformities Skin:  no rashes Neurological: no tremor with outstretched hands  ASSESSMENT: 1. DM2, insulin-dependent, uncontrolled, with complications - CAD - h/o CVA - CKD stage III - severe hypoglycemia in 2019  2. HL  3.  Statin intolerance  PLAN:  1. Patient with longstanding, uncontrolled, type 2 diabetes, on basal/bolus insulin regimen and weekly GLP-1 receptor agonist, with improved HbA1c at last visit.  At that time, HbA1c decreased from 8.1 to 7.8%.  Sugars were mostly fluctuating within the target range with hyperglycemic spikes after meals.  Upon questioning, he was trying to take a low-dose of NovoLog before meals and was taking an additional dose when sugars were increasing after the meal.  We discussed about taking the entire NovoLog dose 15 minutes before he started eating.  I did reduce his NovoLog dose slightly but discussed about increasing the Ozempic dose.  She lost 6 pounds afterwards. CGM interpretation: -At today's visit, we reviewed his CGM downloads: It appears that *** of values are in target range (goal >70%), while *** are higher than 180 (goal <25%), and *** are lower than 70 (goal <4%).  The calculated average blood sugar is ***.  The projected HbA1c for  the next 3 months (GMI) is ***. -Reviewing the CGM trends, ***  - I suggested to:  Patient Instructions  Please continue: - Basaglar 30 units daily - Novolog 10-15 units 15 min before the meals - Ozempic 1 mg weekly  Please return in 3-4 months.  - we checked his HbA1c: 7%  - advised to check sugars at different times of the day - 4x a day, rotating check times - advised for yearly eye exams >> he is UTD - return to clinic in 3-4 months   2. HL -Reviewed latest lipid panel from 01/2022: LDL above target, triglycerides also high, HDL low: Lab Results  Component Value Date   CHOL 150 01/21/2022   HDL 38 (L) 01/21/2022   LDLCALC 85  01/21/2022   LDLDIRECT 110 (H) 06/17/2019   TRIG 175 (H) 01/21/2022   CHOLHDL 3.9 01/21/2022  -He continues on Praluent 150 mg every 2 weeks without side effects.  He previously had side effects from statins: Restless legs and rhabdomyolysis   3.  Statin intolerance -Please see problem #2   Carlus Pavlov, MD PhD [facility] Endocrinology

## 2022-07-12 ENCOUNTER — Ambulatory Visit: Payer: Medicare Other | Admitting: Internal Medicine

## 2022-07-12 NOTE — Progress Notes (Deleted)
Patient ID: Kenneth Denslow Sr., male   DOB: [birth year], 72 y.o.   MRN: [id number]  HPI: Kenneth Snarr Sr. is a 72 y.o.-year-old male, returning for follow-up for DM2, dx in 2016, insulin-dependent  since 2020, uncontrolled, with complications (CAD, history of CVA, CKD stage III). Pt. previously saw Dr. Everardo All, but last visit with me 4 months ago.  Interim history: No increased urination, blurry vision, nausea, chest pain. He lost 5 lbs before last visit and 6 more pounds since then  Reviewed HbA1c: Lab Results  Component Value Date   HGBA1C 7.8 (A) 02/28/2022   HGBA1C 8.1 (H) 10/05/2021   HGBA1C 8.3 (A) 05/07/2021   HGBA1C 7.9 (H) 01/15/2021   HGBA1C 8.1 (A) 12/25/2020   HGBA1C 8.6 (A) 09/14/2020   HGBA1C 8.7 (H) 06/30/2020   HGBA1C 8.6 (H) 02/22/2020   HGBA1C 8.8 (H) 10/18/2019   HGBA1C 7.6 (H) 06/14/2019   Previously on: - Basaglar 30 units at bedtime - Novolog 30-25-25 units (3x a day), immediately before meals: 10-20 units before B 15 units before L 15-20 unit before D He had nausea with metformin - stopped 2 mo ago. He tried Trulicity >> nausea.  Now on: - Basaglar 35 >> 30 units at bedtime - Novolog: 15 minutes before meals: 10-20 units before B 10-15 units before L 15-20 unit before D - Ozempic 0.25 >> 0.5 >> 1 mg weekly   Pt checks his sugars >4 a day with his CGM:  Previously:  Previously:  Lowest sugar was 46; he has hypoglycemia awareness at 70.  Highest sugar was 400.  Glucometer: One Touch ultra  He saw nutrition in the past and changed his diet afterwards.  - + CKD, last BUN/creatinine:  Lab Results  Component Value Date   BUN 20 03/05/2022   BUN 18 03/02/2022   CREATININE 1.25 (H) 03/05/2022   CREATININE 1.29 (H) 03/02/2022  On Diovan 80 mg daily.  -+HL; last set of lipids: Lab Results  Component Value Date   CHOL 150 01/21/2022   HDL 38 (L) 01/21/2022   LDLCALC 85 01/21/2022   LDLDIRECT 110 (H) 06/17/2019   TRIG 175 (H) 01/21/2022    CHOLHDL 3.9 01/21/2022  He is on Praluent.  He had side effects from statins in the past.  - last eye exam was 10/04/2021. No DR.   - +  restless leg syndrome.  He is on Neurontin. Also, on pramipexole for restless leg syndrome.  He sees neurology.  Last foot exam 10/22/2021.  He also has a history of HTN, GERD, Barrett's esophagus, iron deficiency anemia, memory loss.  ROS: + see HPI  Past Medical History:  Diagnosis Date   Arthritis    "left thumb" (05/23/2017) right shoulder   Barrett's esophagus    "we've been told that it's all gone; still take RX for GERD" (05/23/2017)   Bilateral swelling of feet and ankles x 3 weeks as of 12-01-2019   CAD (coronary artery disease), native coronary artery    Coronary angiogram 05/03/2014:  Proximal LAD 3.0x12 mm Promus premier stent.  04/08/2014: Mid Cx 3.5 x 16 mm Promus DES.  03/29/2013: Mid LAD 2.75 x 38 mm Promus Premier drug-eluting stent, balloon angioplasty of D1 and distal LAD and stenting of distal RCA with 2.75 x 24 mm promos Premier drug-eluting stent 12/15/2012 widely patent.   Cervicalgia    Chronic bronchitis (HCC)    "q yr" (05/23/2017)   Complication of anesthesia    " I do not wake up very  well "  slow to wake up   COVID-19 05/2019   all covid symptoms in hospital x 5 days, all symptoms resolved took monoclonal antibody tx    Dyspnea    with exertion   Family history of adverse reaction to anesthesia    "son w/PONV"   GERD (gastroesophageal reflux disease)    Heart murmur    "grew out of it"    Hyperlipidemia    Hypertension    Hypoglycemia, unspecified    IDDM (insulin dependent diabetes mellitus)    type 2   Impotence of organic origin    Iron deficiency anemia    Memory loss    mild   Other malaise and fatigue    Pneumonia 05/2019   PONV (postoperative nausea and vomiting)    after wisdom teeth pulled, no problems with other surgeries   Restless leg    Rotator cuff arthropathy    Stroke (HCC)    Tension headache     "sometimes" (05/23/2017)   Unspecified gastritis and gastroduodenitis with hemorrhage    Unstable angina pectoris (HCC) 04/08/2014   Coronary angiogram 05/03/2014:  Proximal LAD 3.0x12 mm Promus premier stent.  04/08/2014: Mid Cx 3.5 x 16 mm Promus DES.  03/29/2013: Mid LAD 2.75 x 38 mm Promus Premier drug-eluting stent, balloon angioplasty of D1 and distal LAD and stenting of distal RCA with 2.75 x 24 mm promos Premier drug-eluting stent 12/15/2012 widely patent.   Past Surgical History:  Procedure Laterality Date   BICEPT TENODESIS Right 12/07/2019   Procedure: RIGHT BICEPS TENODESIS;  Surgeon: Sheral Apley, MD;  Location: [facility];  Service: Orthopedics;  Laterality: Right;   CARDIAC CATHETERIZATION N/A 09/25/2015   Procedure: Left Heart Cath and Coronary Angiography;  Surgeon: [name], MD;  Location: [facility] INVASIVE CV LAB;  Service: Cardiovascular;  Laterality: N/A;   CARDIAC CATHETERIZATION N/A 09/25/2015   Procedure: Intravascular Pressure Wire/FFR Study;  Surgeon: [name], MD;  Location: [facility] INVASIVE CV LAB;  Service: Cardiovascular;  Laterality: N/A;   CORONARY ANGIOPLASTY WITH STENT PLACEMENT  12/15/2012; 04/28/2014; 05/03/2014   "2; 1; 1" , 4 stents 2 balloons   CORONARY STENT INTERVENTION N/A 05/01/2021   Procedure: CORONARY STENT INTERVENTION;  Surgeon: [name], MD;  Location: [facility] INVASIVE CV LAB;  Service: Cardiovascular;  Laterality: N/A;   FRACTIONAL FLOW RESERVE WIRE Right 03/26/2013   Procedure: FRACTIONAL FLOW RESERVE WIRE;  Surgeon: Pamella Pert, MD;  Location[facility] CATH LAB;  Service: Cardiovascular;  Laterality: Right;   FRACTIONAL FLOW RESERVE WIRE N/A 05/03/2014   Procedure: FRACTIONAL FLOW RESERVE WIRE;  Surgeon: Pamella Pert, MD;  Location: [facility] CATH LAB;  Service: Cardiovascular;  Laterality: N/A;   HERNIA REPAIR  2010   "umbilical"    INCISION AND DRAINAGE ABSCESS Left 05/2007   "groin"    KNEE SURGERY Right 1992;  2000   "calcium deposits  removed" (12/15/2012)   LEFT HEART CATH AND CORONARY ANGIOGRAPHY N/A 05/23/2017   Procedure: LEFT HEART CATH AND CORONARY ANGIOGRAPHY;  Surgeon: [name], MD;  Location: [facility] INVASIVE CV LAB;  Service: Cardiovascular;  Laterality: N/A;   LEFT HEART CATH AND CORONARY ANGIOGRAPHY N/A 05/01/2021   Procedure: LEFT HEART CATH AND CORONARY ANGIOGRAPHY;  Surgeon: [name], MD;  Location: [facility] INVASIVE CV LAB;  Service: Cardiovascular;  Laterality: N/A;   LEFT HEART CATHETERIZATION WITH CORONARY ANGIOGRAM N/A 12/15/2012   Procedure: LEFT HEART CATHETERIZATION WITH CORONARY ANGIOGRAM;  Surgeon: Pamella Pert, MD;  Location: [facility]  CATH LAB;  Service: Cardiovascular;  Laterality: N/A;   LEFT HEART CATHETERIZATION WITH CORONARY ANGIOGRAM N/A 03/26/2013   Procedure: LEFT HEART CATHETERIZATION WITH CORONARY ANGIOGRAM;  Surgeon: Pamella Pert, MD;  Location[facility] CATH LAB;  Service: Cardiovascular;  Laterality: N/A;   LEFT HEART CATHETERIZATION WITH CORONARY ANGIOGRAM N/A 04/08/2014   Procedure: LEFT HEART CATHETERIZATION WITH CORONARY ANGIOGRAM;  Surgeon: Micheline Chapman, MD;  Location: [facility] CATH LAB;  Service: Cardiovascular;  Laterality: N/A;   MOUTH SURGERY Right    PERCUTANEOUS CORONARY STENT INTERVENTION (PCI-S) N/A 05/03/2014   Procedure: PERCUTANEOUS CORONARY STENT INTERVENTION (PCI-S);  Surgeon: Pamella Pert, MD;  Location: [facility] CATH LAB;  Service: Cardiovascular;  Laterality: N/A;   RADIOLOGY WITH ANESTHESIA N/A 02/08/2021   Procedure: MRI WITH ANESTHESIA OF ABDOMEN WITH AND WITHOUT CONSTRAST;  Surgeon: Radiologist, Medication, MD;  Location: [facility] OR;  Service: Radiology;  Laterality: N/A;   ROTATOR CUFF REPAIR  2022   UMBILICAL HERNIA REPAIR  yrs ago   Social History   Socioeconomic History   Marital status: Married    Spouse name: Pam   Number of children: 2   Years of education: 12   Highest education level: High school graduate  Occupational History   Occupation: Naval architect     Comment: deliveries only now   Occupation: woodworking  Tobacco Use   Smoking status: Never   Smokeless tobacco: Never  Vaping Use   Vaping Use: Never used  Substance and Sexual Activity   Alcohol use: No   Drug use: No   Sexual activity: Not Currently  Other Topics Concern   Not on file  Social History Narrative   Right handed    Caffeine 2 cups daily   Winter time 4 cups daily   Lives family one level home    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   Financial Resource Strain: Low Risk  (01/18/2021)   Overall Financial Resource Strain (CARDIA)    Difficulty of Paying Living Expenses: Not very hard  Food Insecurity: No Food Insecurity (01/18/2021)   Hunger Vital Sign    Worried About Running Out of Food in the Last Year: Never true    Ran Out of Food in the Last Year: Never true  Transportation Needs: No Transportation Needs (01/18/2021)   PRAPARE - Administrator, Civil Service (Medical): No    Lack of Transportation (Non-Medical): No  Physical Activity: Inactive (04/25/2017)   Exercise Vital Sign    Days of Exercise per Week: 0 days    Minutes of Exercise per Session: 0 min  Stress: No Stress Concern Present (04/25/2017)   [employer] of Occupational Health - Occupational Stress Questionnaire    Feeling of Stress : Only a little  Social Connections: Moderately Integrated (04/25/2017)   Social Connection and Isolation Panel [NHANES]    Frequency of Communication with Friends and Family: More than three times a week    Frequency of Social Gatherings with Friends and Family: More than three times a week    Attends Religious Services: More than 4 times per year    Active Member of [employer] or Organizations: No    Attends Banker Meetings: Never    Marital Status: Married  Catering manager Violence: Not At Risk (04/25/2017)   Humiliation, Afraid, Rape, and Kick questionnaire    Fear of Current or Ex-Partner: No    Emotionally Abused: No     Physically Abused: No    Sexually Abused:  No   Current Outpatient Medications on File Prior to Visit  Medication Sig Dispense Refill   acetaminophen (TYLENOL) 500 MG tablet Take 500 mg by mouth every 6 (six) hours as needed for mild pain, moderate pain, headache or fever.     albuterol (VENTOLIN HFA) 108 (90 Base) MCG/ACT inhaler TAKE 2 PUFFS BY MOUTH EVERY 6 HOURS AS NEEDED FOR WHEEZE OR SHORTNESS OF BREATH 6.7 each 2   aspirin EC 81 MG tablet Take 1 tablet (81 mg total) by mouth daily. Swallow whole. 90 tablet 3   BD PEN NEEDLE NANO 2ND GEN 32G X 4 MM MISC USE AS DIRECTED 100 each 3   benzonatate (TESSALON PERLES) 100 MG capsule Take 2 capsules (200 mg total) by mouth 3 (three) times daily as needed for cough. 30 capsule 0   Budeson-Glycopyrrol-Formoterol (BREZTRI AEROSPHERE) 160-9-4.8 MCG/ACT AERO Inhale 2 puffs into the lungs in the morning and at bedtime. 5.9 g 0   clopidogrel (PLAVIX) 75 MG tablet Take 1 tablet (75 mg total) by mouth daily. 90 tablet 3   Continuous Blood Gluc Receiver (FREESTYLE LIBRE 14 DAY READER) DEVI Inject 1 Device as directed daily as needed. Use once daily as direct to check blood sugar E11.51 1 each 0   Continuous Blood Gluc Sensor (FREESTYLE LIBRE 14 DAY SENSOR) MISC USE TO TEST BLOOD SUGAR THREE TIMES DAILY. E11.51. E11.59 1 each 12   diphenhydramine-acetaminophen (TYLENOL PM) 25-500 MG TABS tablet Take 1 tablet by mouth at bedtime as needed.     ferrous sulfate 325 (65 FE) MG EC tablet Take 1 tablet (325 mg total) by mouth daily. 30 tablet 11   fluticasone (FLONASE) 50 MCG/ACT nasal spray Place 1 spray into both nostrils daily as needed for allergies or rhinitis. 16 g 11   gabapentin (NEURONTIN) 100 MG capsule Take 300-400 mg by mouth at bedtime.     glucose 4 GM chewable tablet Chew 1 tablet (4 g total) by mouth as needed for low blood sugar. 50 tablet 12   insulin aspart (NOVOLOG FLEXPEN) 100 UNIT/ML FlexPen Inject 25-30 Units into the skin See admin  instructions. Inject 30 units subcutaneously in the morning, inject 25 units subcutaneously with lunch & inject 25 units subcutaneously with dinner. 30 mL 2   Insulin Glargine (BASAGLAR KWIKPEN) 100 UNIT/ML Inject 30 Units into the skin at bedtime. 30 mL 3   isosorbide mononitrate (IMDUR) 60 MG 24 hr tablet TAKE 1 TABLET BY MOUTH EVERY DAY 90 tablet 1   levocetirizine (XYZAL) 5 MG tablet Take 1 tablet (5 mg total) by mouth every evening. 30 tablet 2   LIFESCAN FINEPOINT LANCETS MISC Use to test for blood sugar three times daily dx: E11.51 300 each 1   metoprolol succinate (TOPROL-XL) 25 MG 24 hr tablet TAKE 1 TABLET BY MOUTH EVERY DAY WITH OR IMMEDIATELY FOLLOWING A MEAL 90 tablet 1   nitroGLYCERIN (NITROSTAT) 0.4 MG SL tablet PLACE 1 TABLET UNDER THE TONGUE EVERY 5 MINUTES AS NEEDED FOR CHEST PAIN. 50 tablet 0   ONETOUCH ULTRA test strip USE TO TEST FOR BLOOD SUGAR THREE TIMES DAILY DX: E11.51 300 strip 11   pantoprazole (PROTONIX) 40 MG tablet TAKE ONE TABLET BY MOUTH ONCE DAILY FOR STOMACH 90 tablet 3   PRALUENT 150 MG/ML SOAJ INJECT 1 PEN INTO THE SKIN EVERY 14 (FOURTEEN) DAYS. 6 mL 1   pramipexole (MIRAPEX) 0.5 MG tablet Take 0.5 mg by mouth as needed.     Semaglutide, 1 MG/DOSE, 4 MG/3ML SOPN  Inject 1 mg into the skin once a week. 3 mL 2   valsartan (DIOVAN) 160 MG tablet TAKE 1 TABLET (160 MG TOTAL) BY MOUTH EVERY EVENING. 90 tablet 3   Current Facility-Administered Medications on File Prior to Visit  Medication Dose Route Frequency Provider Last Rate Last Admin   [COMPLETED] hydrALAZINE (APRESOLINE) injection 10 mg  10 mg Intravenous Once [name], MD   10 mg at 05/01/21 1054   Allergies  Allergen Reactions   Amlodipine Other (See Comments)    Worsening restless legs, dyspnea and leg swelling   Lyrica [Pregabalin] Other (See Comments)    Made patient very lethargic the next morning, very hard to patient to function, move, etc.    Other Other (See Comments)    Convulsions, Causes  Restless Legs, rhabdomyolysis   Statins Other (See Comments)    Causes Restless Legs, rhabdomyolysis   Ativan [Lorazepam] Other (See Comments)    Markedly increased restless legs   Trulicity [Dulaglutide] Other (See Comments)    GI side effects    Family History  Adopted: Yes  Problem Relation Age of Onset   Emphysema Mother    PE: There were no vitals taken for this visit. Wt Readings from Last 3 Encounters:  05/20/22 196 lb (88.9 kg)  04/30/22 195 lb 8 oz (88.7 kg)  04/10/22 201 lb (91.2 kg)   Constitutional: overweight, in NAD Eyes: no exophthalmos ENT: no thyromegaly, no cervical lymphadenopathy, + mild nonpitting B LE edema Cardiovascular: RRR, No MRG Respiratory: CTA B Musculoskeletal: no deformities Skin:  no rashes Neurological: no tremor with outstretched hands  ASSESSMENT: 1. DM2, insulin-dependent, uncontrolled, with complications - CAD - h/o CVA - CKD stage III - severe hypoglycemia in 2019  2. HL  3.  Statin intolerance  PLAN:  1. Patient with longstanding, uncontrolled, type 2 diabetes, basal-bolus insulin regimen and weekly GLP-1 receptor agonist, with improved HbA1c control at last visit.  At that time, HbA1c decreased from 8.1% to 7.8%.  Sugars were mostly fluctuating within the target range with hyperglycemic spikes after meals.  Upon questioning, he was taking a lower dose of NovoLog before meals and an additional dose when sugars were increasing after the meal.  We discussed about taking the entire dose of NovoLog 15 minutes before he started eating.  We did reduce his NovoLog dose slightly and I suggested to increase the Ozempic dose >> he lost 6 pounds afterwards.  CGM interpretation: -At today's visit, we reviewed his CGM downloads: It appears that *** of values are in target range (goal >70%), while *** are higher than 180 (goal <25%), and *** are lower than 70 (goal <4%).  The calculated average blood sugar is ***.  The projected HbA1c for the next  3 months (GMI) is ***. -Reviewing the CGM trends, ***  - I suggested to:  Patient Instructions  Please continue: - Basaglar 30 units daily - Novolog 10-15 units 15 min before the meals - Ozempic 1 mg weekly  Please return in 3-4 months.  - we checked his HbA1c: 7%  - advised to check sugars at different times of the day - 4x a day, rotating check times - advised for yearly eye exams >> he is UTD - return to clinic in 3-4 months   2. HL -Reviewed latest lipid panel from 01/2022: LDL above target, triglycerides also high, HDL low: Lab Results  Component Value Date   CHOL 150 01/21/2022   HDL 38 (L) 01/21/2022   LDLCALC 85  01/21/2022   LDLDIRECT 110 (H) 06/17/2019   TRIG 175 (H) 01/21/2022   CHOLHDL 3.9 01/21/2022  -He continues Praluent 150 mg every 2 weeks without side effects.  He previously had side effects from statins: Restless legs and rhabdomyolysis  3.  Statin intolerance -See problem #2  Carlus Pavlov, MD PhD [facility] Endocrinology

## 2022-07-15 NOTE — Progress Notes (Signed)
NEUROLOGY FOLLOW UP OFFICE NOTE  Kenneth Shoff Sr. [id number]  Subjective:  Kenneth Bernasconi Sr. is a 72 y.o. year old male with a history of arthritis, GERD, COPD, CAD, HTN, HLD, DM2, iron deficiency anemia, TIA (01/14/21 - left face droop and blurry vision) who we last saw on 04/10/22.  To briefly review: Patient started having restless legs syndrome. He describes needing to move his legs, then would improve after moving. It would happen worse at night and while in bed. He went untreated for many years. After his first stent in 2014, patient was put on Lipitor. He had such severe shaking, he required hospitalization and narcotics to stop his restless legs. He mentions having problems with his iron. He has taken iron supplementation and iron shots. He was not currently on any iron supplementation at initial visit on 10/22/21.   He has taken many different medications in the past, but is currently on gabapentin 200 mg at bedtime and pramipexole 1mg  at 5 pm, 1mg  at 7 pm, and 2mg  at bedtime (9-10 pm). Previously he did not take pramipexole later, but now requires it earlier and requires more medication. Symptoms continue to worsen though. He cannot sit down long or symptoms will get severe. He feels like he has to move to keep the symptoms at bay. His symptoms have spread throughout the body now.   Patient was previously seen for restless leg syndrome (in 2015) by Dr. Karel Jarvis. To briefly review, per history from clinic note on 09/29/13: "was admitted at [facility] last 09/18/2013 for leg pain due to severe RLS, found to have rhabdomyolysis with CK of 4907.  He was admitted for hydration and pain management.  He has had RLS for at least 20 years, initially affecting his legs, however since 2004 his arms have been involved as well.  He has been taking Mirapex for at least 10 years, and had been takin 0.75mg  dose 1-2 tablets at night.  This dose was causing him drowsiness.  He saw his PCP on 07/14 due to worsening RLS  symptoms. He was tried on Zanaflex and Mirapex was discontinued, which did not help.  He restarted Mirapex and had to take additional doses due to constant limb movements and pain, prompting him to go to the ER.  He was started on Gabapentin and clonazepam which did not help much, he was also given Dilaudid which quieted him down.  He was discharged home then returned to the ER due to persistently restless and painful legs bilaterally. He was described to be literally thrashing about the bed, able to answer questions but in significant distress. He tells me the symptoms would start in his right leg, leg would flex at the hip, then he stretches and leans back because the leg is jerking so violently for up to 1-1/2 hours. This eases off then starts on his left leg.  Walking and moving around helps.  Symptoms improved with pain medication. He saw his PCP a week ago and reported leg swelling, ?due to gabapentin. He was started on Sinemet 10/100mg  TID, which her reports has helped with the RLS symptoms, however wears off in the middle of the night. He would take 1/2 tab Mirapex when Sinemet wears off.  Since starting Sinemet, he reports weird dreams but no other side effects.  He had discontinued gabapentin, clonazepam, and Zanaflex.     He has infrequent headaches, dyspnea on exertion (climbing steps), occasional choking. Otherwise he denies any diplopia, dysarthria, focal numbness/tingling, bowel/bladder dysfunction.  He has a history of C1 fracture at age 1 or 25. No known family history of similar RLS because he was adopted."   Patient has previously tried Mirapex and Sinemet as well as gabapentin, clonazepam, zanaflex, and Tramadol.   He endorses occasional discomfort in his legs and swelling of his legs.   He denies fevers, chills, or unexplained weight lost.   EtOH: none  Dietary restrictions: none Family hx of neurologic disorders: patient is adopted and not sure   At 01/02/22 visit, patient had  dyskinesias thought to be due to excess of pramipexole as he continued to take 3 mg nightly. He was instructed to decrease pramipexole. He described increasing movements continuing on 01/29/22 at which time patient was still taking up to 2 mg of pramipexole nightly. We again agreed to cut down on the pramipexole and take gabapentin instead of RLS symptoms.   He went to [facility] ED on 03/02/22, [facility] ED on 03/04/22, and [facility] again on 03/05/22 for the movements.   [facility] ED assessment/plan: [name] endorses a chronic longstanding history of restless leg syndrome which per history appears consistent with, also presents with years of abnormal uncontrollable movements that are worse at rest and specifically at nighttime. On history it appears the symptoms started sometime after being treated for restless leg syndrome with pramipexole. On exam there is no significant bradykinesia rigidity or postural action tremor. Abnormal movements involving his trunk and lower extremities are observed consistent and concerning for dyskinesia. Overall believe his presentation is consistent with likely a chronic history of restless leg syndrome and a secondary process of dyskinesia in the setting of chronic pramipexole use. Further workup and recommendations as below. - Stop taking pramipexole! - Continue home iron supplementations as tolerated - Continue Gabapentin, take 300 mg in AM, 300 mg at 12:00 PM (noon), 900 mg at night - They recommend follow up with [facility]. A referral has been placed, and you should be hearing from them.  - They recommend improving sleep hygiene with: Setting regular sleep hours, avoiding electronics in bed, practice visualization exercises if he cannot fall asleep, limit liquids prior to sleep, and eliminate all naps.    Patient mentioned that he can set his watch to his symptoms. He knows when he sits down to eat in the evening, he is going to be shaking. He will take medication then. After  his meal, his symptoms will get worse. He will take more medication. He will get worse throughout the night. He will have to keep moving, either on a treadmill or in his shop. This is help with symptoms. In the past, he would get drowsy then finally go to sleep, but not sleep all night. Wife mentions that these jerking movements have been present for years, but maybe worse now (since his 48s per wife). It has gotten worse over the last few months since our treatment plan began.    Wife provided a video from ED on 03/05/22 in which patient had intermittent quick jerking of all extremities, including in the head and neck. It was not rhythmic and without clear pattern. After getting permission from patient in writing (will be scanned into chart), I recorded the recording and patient's jerks today with office iPad.  04/10/22: Patient was having increasing RLS symptoms. He mentioned he was taking 1/2 tablet of pramipexole on 03/20/22, but not every night, and this was helping. I recommended he take 1/2 tablet of pramipexole nightly on 03/21/22 as movements I saw  were no longer felt to be dyskinesias.    Overall, his symptoms are about the same. He has good days and bad days. He will go a couple of nights without sleeping well, and then will sleep well for a couple of days. Last night he walked from 7-9 pm. He slept at 9pm to 3am (waking to go to bathroom). He then slept until 5 am. That was a good night.   He is still attempting to get in touch with Celene Squibb to set up an appointment for FND, but has not yet been able to reach her.   He continues to take gabapentin 300 mg qhs and pramipexole 1/2 tablet (0.50 mg).    Patient went to sleep medicine who recommended a sleep study. He was told he had sleep apnea. He is unsure of follow up plans of now.  Most recent Assessment and Plan (04/10/22): Previous examinations have shown inconsistent and changing characteristics of movements. 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 Withdrawal from pramipexole would not be expected to cause the odd jerking seen in the video. Gabapentin can cause myoclonic jerks, but symptoms were present prior to gabapentin per wife and much more severe than would be expected. The whole body jerking is also not consistent with his previous RLS symptoms. With further history from wife on 03/06/22, it appears this jerking may have been present for longer than previously thought.    There continue to be no signs of parkinsonism on exam today and no dyskinesia/chorea to suggest medication or central process currently. We have previously discussed function neurologic deficits and functional movement disorder with patient (first on 03/06/22).    Overall, patient's symptoms are currently stable. He continues to struggle many nights with poor sleep as a result of symptoms.   Plan: -Recheck iron studies today -Continue B12 supplementation 1000 mcg daily -Continue iron supplementation (ferrous sulfate 325 mg) -Continue pramipexole 0.5 mg qhs -Increase gabapentin to 400 mg qhs (can go up to 600 mg qhs) -Therapy for FND with Cel[facility] Squibb - referral placed - patient has difficulty reaching but will continue to try -Continue to follow with sleep medicine for OSA and insomnia  Since their last visit: Patient was doing well for the last couple of months. He has not been able to sleep over the last 4-5 nights due to shaking though.  He is taking pramipexole 0.25 mg and gabapentin 200 mg around 6 pm. That will help until 8:30pm (bedtime) when he will take pramipexole 0.25 mg and gabapentin 200 mg and a tylenol PM. This was working until the last few nights.  Patient had a sleep study that showed severe sleep apnea. Patient mentions that he thought he was supposed to hear back about  CPAP, but has not.  Patient had mentioned a message therapist, but has not been. Patient is also still on the wait list for the [facility] therapist in [facility]).  MEDICATIONS:  Outpatient Encounter Medications as of 07/24/2022  Medication Sig   acetaminophen (TYLENOL) 500 MG tablet Take 500 mg by mouth every 6 (six) hours as needed for mild pain, moderate pain, headache or fever.   albuterol (VENTOLIN HFA) 108 (90 Base) MCG/ACT inhaler TAKE 2 PUFFS BY MOUTH EVERY 6 HOURS AS NEEDED FOR WHEEZE OR SHORTNESS OF BREATH   aspirin EC 81 MG tablet Take 1 tablet (  81 mg total) by mouth daily. Swallow whole.   Budeson-Glycopyrrol-Formoterol (BREZTRI AEROSPHERE) 160-9-4.8 MCG/ACT AERO Inhale 2 puffs into the lungs in the morning and at bedtime.   clopidogrel (PLAVIX) 75 MG tablet Take 1 tablet (75 mg total) by mouth daily.   Continuous Blood Gluc Receiver (FREESTYLE LIBRE 14 DAY READER) DEVI Inject 1 Device as directed daily as needed. Use once daily as direct to check blood sugar E11.51   Continuous Blood Gluc Sensor (FREESTYLE LIBRE 14 DAY SENSOR) MISC USE TO TEST BLOOD SUGAR THREE TIMES DAILY. E11.51. E11.59   diphenhydramine-acetaminophen (TYLENOL PM) 25-500 MG TABS tablet Take 1 tablet by mouth at bedtime as needed.   ferrous sulfate 325 (65 FE) MG EC tablet Take 1 tablet (325 mg total) by mouth daily.   fluticasone (FLONASE) 50 MCG/ACT nasal spray Place 1 spray into both nostrils daily as needed for allergies or rhinitis.   gabapentin (NEURONTIN) 100 MG capsule Take 300-400 mg by mouth at bedtime.   glucose 4 GM chewable tablet Chew 1 tablet (4 g total) by mouth as needed for low blood sugar.   insulin aspart (NOVOLOG FLEXPEN) 100 UNIT/ML FlexPen Inject into the skin. Per sliding scale per patient managed by endocrinologist.   Insulin Glargine (BASAGLAR KWIKPEN) 100 UNIT/ML Inject 30 Units into the skin at bedtime.   Insulin Pen Needle (BD PEN NEEDLE NANO 2ND GEN) 32G X 4 MM MISC USE AS  DIRECTED   isosorbide mononitrate (IMDUR) 60 MG 24 hr tablet TAKE 1 TABLET BY MOUTH EVERY DAY   levocetirizine (XYZAL) 5 MG tablet Take 1 tablet (5 mg total) by mouth every evening.   LIFESCAN FINEPOINT LANCETS MISC Use to test for blood sugar three times daily dx: E11.51   metoprolol succinate (TOPROL-XL) 25 MG 24 hr tablet TAKE 1 TABLET BY MOUTH EVERY DAY WITH OR IMMEDIATELY FOLLOWING A MEAL   nitroGLYCERIN (NITROSTAT) 0.4 MG SL tablet PLACE 1 TABLET UNDER THE TONGUE EVERY 5 MINUTES AS NEEDED FOR CHEST PAIN.   ONETOUCH ULTRA test strip USE TO TEST FOR BLOOD SUGAR THREE TIMES DAILY DX: E11.51   pantoprazole (PROTONIX) 40 MG tablet TAKE ONE TABLET BY MOUTH ONCE DAILY FOR STOMACH   PRALUENT 150 MG/ML SOAJ INJECT 1 PEN INTO THE SKIN EVERY 14 (FOURTEEN) DAYS.   pramipexole (MIRAPEX) 0.5 MG tablet Take 0.5 mg by mouth as needed.   Semaglutide,0.25 or 0.5MG /DOS, (OZEMPIC, 0.25 OR 0.5 MG/DOSE,) 2 MG/3ML SOPN Inject 0.25 mg into the skin once a week.   valsartan (DIOVAN) 160 MG tablet TAKE 1 TABLET (160 MG TOTAL) BY MOUTH EVERY EVENING.   benzonatate (TESSALON PERLES) 100 MG capsule Take 2 capsules (200 mg total) by mouth 3 (three) times daily as needed for cough. (Patient not taking: Reported on 07/24/2022)   [DISCONTINUED] BD PEN NEEDLE NANO 2ND GEN 32G X 4 MM MISC USE AS DIRECTED   [DISCONTINUED] insulin aspart (NOVOLOG FLEXPEN) 100 UNIT/ML FlexPen Inject 25-30 Units into the skin See admin instructions. Inject 30 units subcutaneously in the morning, inject 25 units subcutaneously with lunch & inject 25 units subcutaneously with dinner. (Patient not taking: Reported on 07/22/2022)   [DISCONTINUED] nitroGLYCERIN (NITROSTAT) 0.4 MG SL tablet PLACE 1 TABLET UNDER THE TONGUE EVERY 5 MINUTES AS NEEDED FOR CHEST PAIN.   [DISCONTINUED] Semaglutide, 1 MG/DOSE, 4 MG/3ML SOPN Inject 1 mg into the skin once a week. (Patient not taking: Reported on 07/22/2022)   Facility-Administered Encounter Medications as of  07/24/2022  Medication   [COMPLETED] hydrALAZINE (APRESOLINE) injection  10 mg    PAST MEDICAL HISTORY: Past Medical History:  Diagnosis Date   Arthritis    "left thumb" (05/23/2017) right shoulder   Barrett's esophagus    "we've been told that it's all gone; still take RX for GERD" (05/23/2017)   Bilateral swelling of feet and ankles x 3 weeks as of 12-01-2019   CAD (coronary artery disease), native coronary artery    Coronary angiogram 05/03/2014:  Proximal LAD 3.0x12 mm Promus premier stent.  04/08/2014: Mid Cx 3.5 x 16 mm Promus DES.  03/29/2013: Mid LAD 2.75 x 38 mm Promus Premier drug-eluting stent, balloon angioplasty of D1 and distal LAD and stenting of distal RCA with 2.75 x 24 mm promos Premier drug-eluting stent 12/15/2012 widely patent.   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  04/08/2014: Mid Cx 3.5 x 16 mm Promus DES.  03/29/2013: Mid LAD 2.75 x 38 mm Promus  Premier drug-eluting stent, balloon angioplasty of D1 and distal LAD and stenting of distal RCA with 2.75 x 24 mm promos Premier drug-eluting stent 12/15/2012 widely patent.    PAST SURGICAL HISTORY: Past Surgical History:  Procedure Laterality Date   BICEPT TENODESIS Right 12/07/2019   Procedure: RIGHT BICEPS TENODESIS;  Surgeon: Sheral Apley, MD;  Location: [facility];  Service: Orthopedics;  Laterality: Right;   CARDIAC CATHETERIZATION N/A 09/25/2015   Procedure: Left Heart Cath and Coronary Angiography;  Surgeon: [name], MD;  Location: [facility] INVASIVE CV LAB;  Service: Cardiovascular;  Laterality: N/A;   CARDIAC CATHETERIZATION N/A 09/25/2015   Procedure: Intravascular Pressure Wire/FFR Study;  Surgeon: [name], MD;  [facility] INVASIVE CV LAB;  Service: Cardiovascular;  Laterality: N/A;   CORONARY ANGIOPLASTY WITH STENT PLACEMENT  12/15/2012; 04/28/2014; 05/03/2014   "2; 1; 1" , 4 stents 2 balloons   CORONARY STENT INTERVENTION N/A 05/01/2021   Procedure: CORONARY STENT INTERVENTION;  Surgeon: [name], MD;  Location: [facility] INVASIVE CV LAB;  Service: Cardiovascular;  Laterality: N/A;   FRACTIONAL FLOW RESERVE WIRE Right 03/26/2013   Procedure: FRACTIONAL FLOW RESERVE WIRE;  Surgeon: Pamella Pert, MD;  Location: [facility] CATH LAB;  Service: Cardiovascular;  Laterality: Right;   FRACTIONAL FLOW RESERVE WIRE N/A  05/03/2014   Procedure: FRACTIONAL FLOW RESERVE WIRE;  Surgeon: Pamella Pert, MD;  Location: [facility] CATH LAB;  Service: Cardiovascular;  Laterality: N/A;   HERNIA REPAIR  2010   "umbilical"    INCISION AND DRAINAGE ABSCESS Left 05/2007   "groin"    KNEE SURGERY Right 1992;  2000   "calcium deposits removed" (12/15/2012)   LEFT HEART CATH AND CORONARY ANGIOGRAPHY N/A 05/23/2017   Procedure: LEFT HEART CATH AND CORONARY ANGIOGRAPHY;  Surgeon: [name], MD;  Location: [facility] INVASIVE CV LAB;  Service: Cardiovascular;  Laterality: N/A;   LEFT HEART CATH AND  CORONARY ANGIOGRAPHY N/A 05/01/2021   Procedure: LEFT HEART CATH AND CORONARY ANGIOGRAPHY;  Surgeon: [name], MD;  Location: [facility] INVASIVE CV LAB;  Service: Cardiovascular;  Laterality: N/A;   LEFT HEART CATHETERIZATION WITH CORONARY ANGIOGRAM N/A 12/15/2012   Procedure: LEFT HEART CATHETERIZATION WITH CORONARY ANGIOGRAM;  Surgeon: Pamella Pert, MD;  Location: [facility] CATH LAB;  Service: Cardiovascular;  Laterality: N/A;   LEFT HEART CATHETERIZATION WITH CORONARY ANGIOGRAM N/A 03/26/2013   Procedure: LEFT HEART CATHETERIZATION WITH CORONARY ANGIOGRAM;  Surgeon: Pamella Pert, MD;  Location: [facility] CATH LAB;  Service: Cardiovascular;  Laterality: N/A;   LEFT HEART CATHETERIZATION WITH CORONARY ANGIOGRAM N/A 04/08/2014   Procedure: LEFT HEART CATHETERIZATION WITH CORONARY ANGIOGRAM;  Surgeon: Micheline Chapman, MD;  Location: [facility] CATH LAB;  Service: Cardiovascular;  Laterality: N/A;   MOUTH SURGERY Right    PERCUTANEOUS CORONARY STENT INTERVENTION (PCI-S) N/A 05/03/2014   Procedure: PERCUTANEOUS CORONARY STENT INTERVENTION (PCI-S);  Surgeon: Pamella Pert, MD;  Location: [facility] CATH LAB;  Service: Cardiovascular;  Laterality: N/A;   RADIOLOGY WITH ANESTHESIA N/A 02/08/2021   Procedure: MRI WITH ANESTHESIA OF ABDOMEN WITH AND WITHOUT CONSTRAST;  Surgeon: Radiologist, Medication, MD;  Location: [facility] OR;  Service: Radiology;  Laterality: N/A;   ROTATOR CUFF REPAIR  2022   UMBILICAL HERNIA REPAIR  yrs ago    ALLERGIES: Allergies  Allergen Reactions   Amlodipine Other (See Comments)    Worsening restless legs, dyspnea and leg swelling   Lyrica [Pregabalin] Other (See Comments)    Made patient very lethargic the next morning, very hard to patient to function, move, etc.    Other Other (See Comments)    Convulsions, Causes Restless Legs, rhabdomyolysis   Statins Other (See Comments)    Causes Restless Legs, rhabdomyolysis   Ativan [Lorazepam] Other (See Comments)    Markedly increased restless legs    Trulicity [Dulaglutide] Other (See Comments)    GI side effects     FAMILY HISTORY: Family History  Adopted: Yes  Problem Relation Age of Onset   Emphysema Mother     SOCIAL HISTORY: Social History   Tobacco Use   Smoking status: Never   Smokeless tobacco: Never  Vaping Use   Vaping Use: Never used  Substance Use Topics   Alcohol use: No   Drug use: No   Social History   Social History Narrative   Right handed    Caffeine 2 cups daily   Winter time 4 cups daily   Lives family one level home    Semi Retired      Objective:  Vital Signs:  BP (!) 159/82 (BP Location: Left Arm, Patient Position: Sitting, Cuff Size: Normal) Comment: patient notified high  Pulse 68   Resp 18   Ht 5\' 8"  (1.727 m)   Wt 202 lb 9.6 oz (91.9 kg)   SpO2 99%   BMI 30.81 kg/m  General: No acute distress.  Patient appears well-groomed.   Head:  Normocephalic/atraumatic Neck: supple Neurological Exam: alert and oriented.  Speech fluent and not dysarthric, language intact.  CN II-XII intact. Bulk and tone normal, no abnormal movements during appointment today, muscle strength 5/5 throughout.  Sensation to light touch intact.  Deep tendon reflexes 2+ throughout. Gait normal.   Labs and Imaging review: New results: 07/22/22: Iron studies: normal (ferritin 173) HbA1c: 9.1  04/10/22:  Iron studies: normal (ferritin 119)  Sleep study (04/08/22): IMPRESSIONS - Severe Obstructive Sleep apnea(OSA) Sub-Optimal pressure attained. - Electrocardiographic data showed presence of PVCs. - No Significant Central Sleep Apnea (CSA) - Moderate Oxygen Desaturation - The patient snored with moderate snoring volume. - EEG did not show alpha intrusion. - No significant periodic leg movements(PLMs) during sleep. However, no significant associated arousals. - Reduced sleep efficiency, normal primary sleep latency, long REM sleep latency and no slow wave latency. - Sleep efficiency was significantly reduced  by multiple episodes of hypoglycemia and intervention, leg cramping, pain and discomfort,  Previously reviewed results: HbA1c (02/28/22): 7.8   10/22/21: Ferritin 21 B12: 248 Normal or unremarkable: Vit D CMP significant for elevated glucose (193) and BUN (28) CBC significant for platelets of 110 (chronic)   EMG (03/23/14): Normal study of RUE and RLE (no myopathy, radiculopathy, or peripheral neuropathy).   MRI brain, MRA head and neck (01/14/21): IMPRESSION: MRI HEAD:   Normal brain MRI for age. No acute intracranial infarct or other abnormality identified.   MRA HEAD:   Normal intracranial MRA. No large vessel occlusion or hemodynamically significant stenosis. No aneurysm.   MRA NECK:   1. 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 The distal right V4 segment is patent on corresponding MRA head portion of this exam with perfusion of the right PICA. 2. Widely patent dominant left vertebral artery. 3. Wide patency of both carotid artery systems within the neck.  Assessment/Plan:  This is Kenneth Sic Sr., a 72 y.o. male with: Restless leg syndrome - complicated by iron deficiency, now normalized on iron supplementation. No evidence of parkinsonism on exam. 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 B12 deficiency - will restart supplementation  Plan: -Discuss CPAP with sleep medicine -Work with PCP on diabetes -Continue pramipexole 0.5 mg qhs -Continue gabapentin 400-600 mg qhs -Continue ferrous sulfate 325 mg daily for now -Therapy for FND with Celene Squibb - referral placed - patient is currently on the wait list to be seen -Continue B12 1000 mcg daily  Return to clinic in 6 months  Total time spent reviewing records, interview, history/exam, documentation, and coordination of care on day of  encounter:  30 min  Jacquelyne Balint, MD

## 2022-07-16 ENCOUNTER — Other Ambulatory Visit: Payer: Self-pay

## 2022-07-16 MED ORDER — BD PEN NEEDLE NANO 2ND GEN 32G X 4 MM MISC: 3 refills | Status: DC

## 2022-07-22 ENCOUNTER — Ambulatory Visit (INDEPENDENT_AMBULATORY_CARE_PROVIDER_SITE_OTHER): Payer: Medicare Other | Admitting: Nurse Practitioner

## 2022-07-22 ENCOUNTER — Encounter: Payer: Self-pay | Admitting: Nurse Practitioner

## 2022-07-22 VITALS — BP 132/84 | HR 64 | Temp 97.1°F | Ht 68.0 in | Wt 204.0 lb

## 2022-07-22 DIAGNOSIS — N401 Enlarged prostate with lower urinary tract symptoms: Secondary | ICD-10-CM | POA: Diagnosis not present

## 2022-07-22 DIAGNOSIS — E1165 Type 2 diabetes mellitus with hyperglycemia: Secondary | ICD-10-CM

## 2022-07-22 DIAGNOSIS — R3914 Feeling of incomplete bladder emptying: Secondary | ICD-10-CM

## 2022-07-22 DIAGNOSIS — Z6831 Body mass index (BMI) 31.0-31.9, adult: Secondary | ICD-10-CM | POA: Diagnosis not present

## 2022-07-22 DIAGNOSIS — I25118 Atherosclerotic heart disease of native coronary artery with other forms of angina pectoris: Secondary | ICD-10-CM

## 2022-07-22 DIAGNOSIS — G2581 Restless legs syndrome: Secondary | ICD-10-CM | POA: Diagnosis not present

## 2022-07-22 DIAGNOSIS — D509 Iron deficiency anemia, unspecified: Secondary | ICD-10-CM | POA: Diagnosis not present

## 2022-07-22 DIAGNOSIS — E6609 Other obesity due to excess calories: Secondary | ICD-10-CM

## 2022-07-22 DIAGNOSIS — Z794 Long term (current) use of insulin: Secondary | ICD-10-CM | POA: Diagnosis not present

## 2022-07-22 DIAGNOSIS — I10 Essential (primary) hypertension: Secondary | ICD-10-CM | POA: Diagnosis not present

## 2022-07-22 MED ORDER — NITROGLYCERIN 0.4 MG SL SUBL: SUBLINGUAL_TABLET | SUBLINGUAL | 0 refills | Status: DC

## 2022-07-22 MED ORDER — OZEMPIC (0.25 OR 0.5 MG/DOSE) 2 MG/3ML ~~LOC~~ SOPN: 0.2500 mg | PEN_INJECTOR | SUBCUTANEOUS | 1 refills | Status: DC

## 2022-07-22 NOTE — Patient Instructions (Signed)
STOP gummies  Decrease carbohydrates in diet, more protein, more fiber  Restart ozempic injection

## 2022-07-22 NOTE — Progress Notes (Signed)
Careteam: Patient Care Team: [name], NP as PCP - General (Geriatric Medicine) [name], MD as Consulting Physician (Cardiology) Van Clines, MD as Consulting Physician (Neurology) [name], [name], OD (Optometry) [name], MD as Consulting Physician (Otolaryngology)  PLACE OF SERVICE:  [facility]  Advanced Directive information Does Patient Have a Medical Advance Directive?: No, Would patient like information on creating a medical advance directive?: Yes (MAU/Ambulatory/Procedural Areas - Information given)  Allergies  Allergen Reactions   Amlodipine Other (See Comments)    Worsening restless legs, dyspnea and leg swelling   Lyrica [Pregabalin] Other (See Comments)    Made patient very lethargic the next morning, very hard to patient to function, move, etc.    Other Other (See Comments)    Convulsions, Causes Restless Legs, rhabdomyolysis   Statins Other (See Comments)    Causes Restless Legs, rhabdomyolysis   Ativan [Lorazepam] Other (See Comments)    Markedly increased restless legs   Trulicity [Dulaglutide] Other (See Comments)    GI side effects     Chief Complaint  Patient presents with   Medical Management of Chronic Issues    6 month follow-up     HPI: Patient is a 72 y.o. male for routine follow up  DM- has a sensor, lowest is 54 (generally at night 2-3 am) Highest blood sugars 450- this week he has been high, can not keep it down this week He was taking semaglutide, increased to 1 mg and could not tolerate it . He was able to tolerated 0.25 and 0.5 mg but the 1 mg did not do well.  This week his restless leg has been bad- generally well controlled- he is seeing neurologist this week.   GERD- well controlled on protonix  CAD- no chest pains, followed by Dr [name]  OSA- cpap per pulmonary   Review of Systems:  Review of Systems  Constitutional:  Negative for chills, fever and weight loss.  HENT:  Negative for tinnitus.    Respiratory:  Negative for cough, sputum production and shortness of breath.   Cardiovascular:  Negative for chest pain, palpitations and leg swelling.  Gastrointestinal:  Negative for abdominal pain, constipation, diarrhea and heartburn.  Genitourinary:  Negative for dysuria, frequency and urgency.  Musculoskeletal:  Negative for back pain, falls, joint pain and myalgias.  Skin: Negative.   Neurological:  Negative for dizziness and headaches.  Psychiatric/Behavioral:  Negative for depression and memory loss. The patient does not have insomnia.     Past Medical History:  Diagnosis Date   Arthritis    "left thumb" (05/23/2017) right shoulder   Barrett's esophagus    "we've been told that it's all gone; still take RX for GERD" (05/23/2017)   Bilateral swelling of feet and ankles x 3 weeks as of 12-01-2019   CAD (coronary artery disease), native coronary artery    Coronary angiogram 05/03/2014:  Proximal LAD 3.0x12 mm Promus premier stent.  04/08/2014: Mid Cx 3.5 x 16 mm Promus DES.  03/29/2013: Mid LAD 2.75 x 38 mm Promus Premier drug-eluting stent, balloon angioplasty of D1 and distal LAD and stenting of distal RCA with 2.75 x 24 mm promos Premier drug-eluting stent 12/15/2012 widely patent.   Cervicalgia    Chronic bronchitis (HCC)    "q yr" (05/23/2017)   Complication of anesthesia    " I do not wake up very well "  slow to wake up   COVID-19 05/2019   all covid symptoms in hospital x 5  days, all symptoms resolved took monoclonal antibody tx    Dyspnea    with exertion   Family history of adverse reaction to anesthesia    "son w/PONV"   GERD (gastroesophageal reflux disease)    Heart murmur    "grew out of it"    Hyperlipidemia    Hypertension    Hypoglycemia, unspecified    IDDM (insulin dependent diabetes mellitus)    type 2   Impotence of organic origin    Iron deficiency anemia    Memory loss    mild   Other malaise and fatigue    Pneumonia 05/2019   PONV (postoperative  nausea and vomiting)    after wisdom teeth pulled, no problems with other surgeries   Restless leg    Rotator cuff arthropathy    Stroke (HCC)    Tension headache    "sometimes" (05/23/2017)   Unspecified gastritis and gastroduodenitis with hemorrhage    Unstable angina pectoris (HCC) 04/08/2014   Coronary angiogram 05/03/2014:  Proximal LAD 3.0x12 mm Promus premier stent.  04/08/2014: Mid Cx 3.5 x 16 mm Promus DES.  03/29/2013: Mid LAD 2.75 x 38 mm Promus Premier drug-eluting stent, balloon angioplasty of D1 and distal LAD and stenting of distal RCA with 2.75 x 24 mm promos Premier drug-eluting stent 12/15/2012 widely patent.   Past Surgical History:  Procedure Laterality Date   BICEPT TENODESIS Right 12/07/2019   Procedure: RIGHT BICEPS TENODESIS;  Surgeon: Sheral Apley, MD;  Location: [facility];  Service: Orthopedics;  Laterality: Right;   CARDIAC CATHETERIZATION N/A 09/25/2015   Procedure: Left Heart Cath and Coronary Angiography;  Surgeon: [name], MD;  Location: [facility] INVASIVE CV LAB;  Service: Cardiovascular;  Laterality: N/A;   CARDIAC CATHETERIZATION N/A 09/25/2015   Procedure: Intravascular Pressure Wire/FFR Study;  Surgeon: [name], MD;  Location: [facility] INVASIVE CV LAB;  Service: Cardiovascular;  Laterality: N/A;   CORONARY ANGIOPLASTY WITH STENT PLACEMENT  12/15/2012; 04/28/2014; 05/03/2014   "2; 1; 1" , 4 stents 2 balloons   CORONARY STENT INTERVENTION N/A 05/01/2021   Procedure: CORONARY STENT INTERVENTION;  Surgeon: [name], MD;  Location: [facility] INVASIVE CV LAB;  Service: Cardiovascular;  Laterality: N/A;   FRACTIONAL FLOW RESERVE WIRE Right 03/26/2013   Procedure: FRACTIONAL FLOW RESERVE WIRE;  Surgeon: Pamella Pert, MD;  Location: [facility] CATH LAB;  Service: Cardiovascular;  Laterality: Right;   FRACTIONAL FLOW RESERVE WIRE N/A 05/03/2014   Procedure: FRACTIONAL FLOW RESERVE WIRE;  Surgeon: Pamella Pert, MD;  Location: [facility] CATH LAB;  Service: Cardiovascular;   Laterality: N/A;   HERNIA REPAIR  2010   "umbilical"    INCISION AND DRAINAGE ABSCESS Left 05/2007   "groin"    KNEE SURGERY Right 1992;  2000   "calcium deposits removed" (12/15/2012)   LEFT HEART CATH AND CORONARY ANGIOGRAPHY N/A 05/23/2017   Procedure: LEFT HEART CATH AND CORONARY ANGIOGRAPHY;  Surgeon: [name], MD;  Location: [facility] INVASIVE CV LAB;  Service: Cardiovascular;  Laterality: N/A;   LEFT HEART CATH AND CORONARY ANGIOGRAPHY N/A 05/01/2021   Procedure: LEFT HEART CATH AND CORONARY ANGIOGRAPHY;  Surgeon: [name], MD;  Location: [facility] INVASIVE CV LAB;  Service: Cardiovascular;  Laterality: N/A;   LEFT HEART CATHETERIZATION WITH CORONARY ANGIOGRAM N/A 12/15/2012   Procedure: LEFT HEART CATHETERIZATION WITH CORONARY ANGIOGRAM;  Surgeon: Pamella Pert, MD;  Location: [facility] CATH LAB;  Service: Cardiovascular;  Laterality: N/A;   LEFT HEART CATHETERIZATION WITH CORONARY ANGIOGRAM N/A 03/26/2013  Procedure: LEFT HEART CATHETERIZATION WITH CORONARY ANGIOGRAM;  Surgeon: Pamella Pert, MD;  Location: [facility] CATH LAB;  Service: Cardiovascular;  Laterality: N/A;   LEFT HEART CATHETERIZATION WITH CORONARY ANGIOGRAM N/A 04/08/2014   Procedure: LEFT HEART CATHETERIZATION WITH CORONARY ANGIOGRAM;  Surgeon: Micheline Chapman, MD;  Location: [facility] CATH LAB;  Service: Cardiovascular;  Laterality: N/A;   MOUTH SURGERY Right    PERCUTANEOUS CORONARY STENT INTERVENTION (PCI-S) N/A 05/03/2014   Procedure: PERCUTANEOUS CORONARY STENT INTERVENTION (PCI-S);  Surgeon: Pamella Pert, MD;  Location: [facility] CATH LAB;  Service: Cardiovascular;  Laterality: N/A;   RADIOLOGY WITH ANESTHESIA N/A 02/08/2021   Procedure: MRI WITH ANESTHESIA OF ABDOMEN WITH AND WITHOUT CONSTRAST;  Surgeon: Radiologist, Medication, MD;  Location: [facility] OR;  Service: Radiology;  Laterality: N/A;   ROTATOR CUFF REPAIR  2022   UMBILICAL HERNIA REPAIR  yrs ago   Social History:   reports that he has never smoked. He has never used smokeless  tobacco. He reports that he does not drink alcohol and does not use drugs.  Family History  Adopted: Yes  Problem Relation Age of Onset   Emphysema Mother     Medications: Patient's Medications  New Prescriptions   No medications on file  Previous Medications   ACETAMINOPHEN (TYLENOL) 500 MG TABLET    Take 500 mg by mouth every 6 (six) hours as needed for mild pain, moderate pain, headache or fever.   ALBUTEROL (VENTOLIN HFA) 108 (90 BASE) MCG/ACT INHALER    TAKE 2 PUFFS BY MOUTH EVERY 6 HOURS AS NEEDED FOR WHEEZE OR SHORTNESS OF BREATH   ASPIRIN EC 81 MG TABLET    Take 1 tablet (81 mg total) by mouth daily. Swallow whole.   BENZONATATE (TESSALON PERLES) 100 MG CAPSULE    Take 2 capsules (200 mg total) by mouth 3 (three) times daily as needed for cough.   BUDESON-GLYCOPYRROL-FORMOTEROL (BREZTRI AEROSPHERE) 160-9-4.8 MCG/ACT AERO    Inhale 2 puffs into the lungs in the morning and at bedtime.   CLOPIDOGREL (PLAVIX) 75 MG TABLET    Take 1 tablet (75 mg total) by mouth daily.   CONTINUOUS BLOOD GLUC RECEIVER (FREESTYLE LIBRE 14 DAY READER) DEVI    Inject 1 Device as directed daily as needed. Use once daily as direct to check blood sugar E11.51   CONTINUOUS BLOOD GLUC SENSOR (FREESTYLE LIBRE 14 DAY SENSOR) MISC    USE TO TEST BLOOD SUGAR THREE TIMES DAILY. E11.51. E11.59   DIPHENHYDRAMINE-ACETAMINOPHEN (TYLENOL PM) 25-500 MG TABS TABLET    Take 1 tablet by mouth at bedtime as needed.   FERROUS SULFATE 325 (65 FE) MG EC TABLET    Take 1 tablet (325 mg total) by mouth daily.   FLUTICASONE (FLONASE) 50 MCG/ACT NASAL SPRAY    Place 1 spray into both nostrils daily as needed for allergies or rhinitis.   GABAPENTIN (NEURONTIN) 100 MG CAPSULE    Take 300-400 mg by mouth at bedtime.   GLUCOSE 4 GM CHEWABLE TABLET    Chew 1 tablet (4 g total) by mouth as needed for low blood sugar.   INSULIN GLARGINE (BASAGLAR KWIKPEN) 100 UNIT/ML    Inject 30 Units into the skin at bedtime.   INSULIN PEN NEEDLE (BD  PEN NEEDLE NANO 2ND GEN) 32G X 4 MM MISC    USE AS DIRECTED   ISOSORBIDE MONONITRATE (IMDUR) 60 MG 24 HR TABLET    TAKE 1 TABLET BY MOUTH EVERY DAY   LEVOCETIRIZINE (XYZAL) 5 MG TABLET  Take 1 tablet (5 mg total) by mouth every evening.   LIFESCAN FINEPOINT LANCETS MISC    Use to test for blood sugar three times daily dx: E11.51   METOPROLOL SUCCINATE (TOPROL-XL) 25 MG 24 HR TABLET    TAKE 1 TABLET BY MOUTH EVERY DAY WITH OR IMMEDIATELY FOLLOWING A MEAL   NITROGLYCERIN (NITROSTAT) 0.4 MG SL TABLET    PLACE 1 TABLET UNDER THE TONGUE EVERY 5 MINUTES AS NEEDED FOR CHEST PAIN.   ONETOUCH ULTRA TEST STRIP    USE TO TEST FOR BLOOD SUGAR THREE TIMES DAILY DX: E11.51   PANTOPRAZOLE (PROTONIX) 40 MG TABLET    TAKE ONE TABLET BY MOUTH ONCE DAILY FOR STOMACH   PRALUENT 150 MG/ML SOAJ    INJECT 1 PEN INTO THE SKIN EVERY 14 (FOURTEEN) DAYS.   PRAMIPEXOLE (MIRAPEX) 0.5 MG TABLET    Take 0.5 mg by mouth as needed.   SEMAGLUTIDE, 1 MG/DOSE, 4 MG/3ML SOPN    Inject 1 mg into the skin once a week.   VALSARTAN (DIOVAN) 160 MG TABLET    TAKE 1 TABLET (160 MG TOTAL) BY MOUTH EVERY EVENING.  Modified Medications   No medications on file  Discontinued Medications   INSULIN ASPART (NOVOLOG FLEXPEN) 100 UNIT/ML FLEXPEN    Inject 25-30 Units into the skin See admin instructions. Inject 30 units subcutaneously in the morning, inject 25 units subcutaneously with lunch & inject 25 units subcutaneously with dinner.    Physical Exam:  Vitals:   07/22/22 0823 07/22/22 0836  BP: (!) 138/92 132/84  Pulse: 64   Temp: (!) 97.1 F (36.2 C)   TempSrc: Temporal   SpO2: 97%   Weight: 204 lb (92.5 kg)   Height: 5\' 8"  (1.727 m)    Body mass index is 31.02 kg/m. Wt Readings from Last 3 Encounters:  07/22/22 204 lb (92.5 kg)  05/20/22 196 lb (88.9 kg)  04/30/22 195 lb 8 oz (88.7 kg)    Physical Exam Constitutional:      General: He is not in acute distress.    Appearance: He is well-developed. He is not diaphoretic.   HENT:     Head: Normocephalic and atraumatic.     Right Ear: External ear normal.     Left Ear: External ear normal.     Mouth/Throat:     Pharynx: No oropharyngeal exudate.  Eyes:     Conjunctiva/sclera: Conjunctivae normal.     Pupils: Pupils are equal, round, and reactive to light.  Cardiovascular:     Rate and Rhythm: Normal rate and regular rhythm.     Heart sounds: Normal heart sounds.  Pulmonary:     Effort: Pulmonary effort is normal.     Breath sounds: Normal breath sounds.  Abdominal:     General: Bowel sounds are normal.     Palpations: Abdomen is soft.  Musculoskeletal:        General: No tenderness.     Cervical back: Normal range of motion and neck supple.     Right lower leg: No edema.     Left lower leg: No edema.  Skin:    General: Skin is warm and dry.  Neurological:     Mental Status: He is alert and oriented to person, place, and time.     Labs reviewed: Basic Metabolic Panel: Recent Labs    09/03/21 0826 10/05/21 0859 01/01/22 0513 01/01/22 0936 03/02/22 1854 03/05/22 1513  NA  --    < > 139  --  139 137  K  --    < > 4.1  --  4.1 4.5  CL  --    < > 105  --  103 103  CO2  --    < > 24  --  30 26  GLUCOSE  --    < > 89  --  88 110*  BUN  --    < > 18  --  18 20  CREATININE  --    < > 1.28*  --  1.29* 1.25*  CALCIUM  --    < > 9.3  --  10.0 9.2  MG 2.6*  --   --  2.1 2.2  --   PHOS  --   --   --   --  4.3  --    < > = values in this interval not displayed.   Liver Function Tests: Recent Labs    10/05/21 0859 10/22/21 1543 03/05/22 1513  AST 22 24 44*  ALT 28 30 41  ALKPHOS  --  54 56  BILITOT 0.5 0.6 0.9  PROT 6.4 6.6 6.9  ALBUMIN  --  4.1 4.1   No results for input(s): "LIPASE", "AMYLASE" in the last 8760 hours. No results for input(s): "AMMONIA" in the last 8760 hours. CBC: Recent Labs    10/05/21 0859 10/22/21 1543 01/01/22 0513 03/02/22 1854 03/05/22 1513  WBC 5.2   < > 7.1 7.7 7.2  NEUTROABS 3,557  --   --   --  5.1   HGB 13.0*   < > 15.7 15.7 14.6  HCT 39.4   < > 45.4 45.6 43.8  MCV 86.6   < > 86.8 89.1 89.9  PLT 133*   < > 146* 125* 128*   < > = values in this interval not displayed.   Lipid Panel: Recent Labs    01/21/22 0857  CHOL 150  HDL 38*  LDLCALC 85  TRIG 161*  CHOLHDL 3.9   TSH: No results for input(s): "TSH" in the last 8760 hours. A1C: Lab Results  Component Value Date   HGBA1C 7.8 (A) 02/28/2022     Assessment/Plan 1. Coronary artery disease involving native coronary artery of native heart with other form of angina pectoris (HCC) -stable, no recent chest pains. - nitroGLYCERIN (NITROSTAT) 0.4 MG SL tablet; PLACE 1 TABLET UNDER THE TONGUE EVERY 5 MINUTES AS NEEDED FOR CHEST PAIN.  Dispense: 50 tablet; Refill: 0 - Complete Metabolic Panel with eGFR - CBC with Differential/Platelet  2. Type 2 diabetes mellitus with hyperglycemia, with long-term current use of insulin (HCC) -blood sugars not controlled, could not tolerate semaglutide 1 mg so stopped, will restart at lowe dose at this time -Encouraged dietary compliance, routine foot care/monitoring and to keep up with diabetic eye exams through ophthalmology  - Microalbumin/Creatinine Ratio, Urine - Complete Metabolic Panel with eGFR - CBC with Differential/Platelet - Hemoglobin A1c - Semaglutide,0.25 or 0.5MG /DOS, (OZEMPIC, 0.25 OR 0.5 MG/DOSE,) 2 MG/3ML SOPN; Inject 0.25 mg into the skin once a week.  Dispense: 3 mL; Refill: 1  3. Restless legs syndrome (RLS) -following with neurology, has upcoming appt  - Iron, TIBC and Ferritin Panel  4. Class 1 obesity due to excess calories with serious comorbidity and body mass index (BMI) of 31.0 to 31.9 in adult --education provided on healthy weight loss through increase in physical activity and proper nutrition   5. Essential hypertension -Blood pressure well controlled, goal bp <140/90 Continue current medications and dietary  modifications follow metabolic panel  6. Iron  deficiency anemia, unspecified iron deficiency anemia type -with more symptoms of Restless legs, will follow up iron levels.  Continues on iron supplement at this time -cbc - Iron, TIBC and Ferritin Panel  7. Benign prostatic hyperplasia with incomplete bladder emptying -having more symptoms in the evening. Will follow up PSA - PSA   Return in about 6 months (around 01/22/2023) for routine follow up, labs with visit.  Janene Harvey. Biagio Borg [facility] [phone]

## 2022-07-23 LAB — COMPLETE METABOLIC PANEL WITH GFR
AG Ratio: 1.9 (calc) (ref 1.0–2.5)
ALT: 38 U/L (ref 9–46)
AST: 25 U/L (ref 10–35)
Albumin: 4.4 g/dL (ref 3.6–5.1)
Alkaline phosphatase (APISO): 54 U/L (ref 35–144)
BUN: 18 mg/dL (ref 7–25)
CO2: 29 mmol/L (ref 20–32)
Calcium: 9.5 mg/dL (ref 8.6–10.3)
Chloride: 104 mmol/L (ref 98–110)
Creat: 1.15 mg/dL (ref 0.70–1.28)
Globulin: 2.3 g/dL (calc) (ref 1.9–3.7)
Glucose, Bld: 154 mg/dL — ABNORMAL HIGH (ref 65–99)
Potassium: 4.9 mmol/L (ref 3.5–5.3)
Sodium: 141 mmol/L (ref 135–146)
Total Bilirubin: 0.6 mg/dL (ref 0.2–1.2)
Total Protein: 6.7 g/dL (ref 6.1–8.1)
eGFR: 68 mL/min/{1.73_m2} (ref >=60)

## 2022-07-23 LAB — CBC WITH DIFFERENTIAL/PLATELET
Absolute Monocytes: 480 cells/uL (ref 200–950)
Basophils Absolute: 29 cells/uL (ref 0–200)
Basophils Relative: 0.6 %
Eosinophils Absolute: 120 cells/uL (ref 15–500)
Eosinophils Relative: 2.5 %
HCT: 44.5 % (ref 38.5–50.0)
Hemoglobin: 15.2 g/dL (ref 13.2–17.1)
Lymphs Abs: 1277 cells/uL (ref 850–3900)
MCH: 30.6 pg (ref 27.0–33.0)
MCHC: 34.2 g/dL (ref 32.0–36.0)
MCV: 89.5 fL (ref 80.0–100.0)
MPV: 11.3 fL (ref 7.5–12.5)
Monocytes Relative: 10 %
Neutro Abs: 2894 cells/uL (ref 1500–7800)
Neutrophils Relative %: 60.3 %
Platelets: 124 10*3/uL — ABNORMAL LOW (ref 140–400)
RBC: 4.97 10*6/uL (ref 4.20–5.80)
RDW: 12.2 % (ref 11.0–15.0)
Total Lymphocyte: 26.6 %
WBC: 4.8 10*3/uL (ref 3.8–10.8)

## 2022-07-23 LAB — PSA: PSA: 1.68 ng/mL (ref <=4.00)

## 2022-07-23 LAB — MICROALBUMIN / CREATININE URINE RATIO
Creatinine, Urine: 51 mg/dL (ref 20–320)
Microalb Creat Ratio: 12 mg/g creat (ref <30)
Microalb, Ur: 0.6 mg/dL

## 2022-07-23 LAB — IRON,TIBC AND FERRITIN PANEL
%SAT: 39 % (calc) (ref 20–48)
Ferritin: 173 ng/mL (ref 24–380)
Iron: 104 ug/dL (ref 50–180)
TIBC: 269 mcg/dL (calc) (ref 250–425)

## 2022-07-23 LAB — HEMOGLOBIN A1C
Hgb A1c MFr Bld: 9.1 % of total Hgb — ABNORMAL HIGH (ref <5.7)
Mean Plasma Glucose: 214 mg/dL
eAG (mmol/L): 11.9 mmol/L

## 2022-07-24 ENCOUNTER — Encounter: Payer: Self-pay | Admitting: Neurology

## 2022-07-24 ENCOUNTER — Ambulatory Visit (INDEPENDENT_AMBULATORY_CARE_PROVIDER_SITE_OTHER): Payer: Medicare Other | Admitting: Neurology

## 2022-07-24 VITALS — BP 138/70 | HR 68 | Resp 18 | Ht 68.0 in | Wt 202.6 lb

## 2022-07-24 DIAGNOSIS — E611 Iron deficiency: Secondary | ICD-10-CM

## 2022-07-24 DIAGNOSIS — E559 Vitamin D deficiency, unspecified: Secondary | ICD-10-CM

## 2022-07-24 DIAGNOSIS — E538 Deficiency of other specified B group vitamins: Secondary | ICD-10-CM | POA: Diagnosis not present

## 2022-07-24 DIAGNOSIS — G259 Extrapyramidal and movement disorder, unspecified: Secondary | ICD-10-CM | POA: Diagnosis not present

## 2022-07-24 DIAGNOSIS — G2581 Restless legs syndrome: Secondary | ICD-10-CM | POA: Diagnosis not present

## 2022-07-24 NOTE — Patient Instructions (Addendum)
-  Discuss CPAP with sleep medicine Virl Diamond MD) -Work with primary care doctor on diabetes -Continue pramipexole 0.5 mg at bedtime -Continue gabapentin 400-600 mg at bedtime -Continue ferrous sulfate 325 mg daily for now -Therapy for functional neurologic movement disorder with [facility] Squibb - on waitlist -Continue B12 1000 mcg daily  Please let me know if you have new or worsening symptoms.  The physicians and staff at [facility] are committed to providing excellent care. You may receive a survey requesting feedback about your experience at our office. We strive to receive "very good" responses to the survey questions. If you feel that your experience would prevent you from giving the office a "very good " response, please contact our office to try to remedy the situation. We may be reached at [phone]. Thank you for taking the time out of your busy day to complete the survey.  Jacquelyne Balint, MD [facility]

## 2022-08-08 ENCOUNTER — Other Ambulatory Visit: Payer: Self-pay | Admitting: Internal Medicine

## 2022-08-08 DIAGNOSIS — E119 Type 2 diabetes mellitus without complications: Secondary | ICD-10-CM

## 2022-08-19 ENCOUNTER — Encounter: Payer: Self-pay | Admitting: Internal Medicine

## 2022-08-19 ENCOUNTER — Ambulatory Visit (INDEPENDENT_AMBULATORY_CARE_PROVIDER_SITE_OTHER): Payer: Medicare Other | Admitting: Internal Medicine

## 2022-08-19 VITALS — BP 132/80 | HR 71 | Ht 68.0 in | Wt 200.4 lb

## 2022-08-19 DIAGNOSIS — Z794 Long term (current) use of insulin: Secondary | ICD-10-CM | POA: Diagnosis not present

## 2022-08-19 DIAGNOSIS — Z7985 Long-term (current) use of injectable non-insulin antidiabetic drugs: Secondary | ICD-10-CM | POA: Diagnosis not present

## 2022-08-19 DIAGNOSIS — E119 Type 2 diabetes mellitus without complications: Secondary | ICD-10-CM

## 2022-08-19 DIAGNOSIS — E785 Hyperlipidemia, unspecified: Secondary | ICD-10-CM | POA: Diagnosis not present

## 2022-08-19 DIAGNOSIS — E1159 Type 2 diabetes mellitus with other circulatory complications: Secondary | ICD-10-CM

## 2022-08-19 DIAGNOSIS — E1165 Type 2 diabetes mellitus with hyperglycemia: Secondary | ICD-10-CM

## 2022-08-19 NOTE — Progress Notes (Signed)
Patient ID: Kenneth Baumhardt Sr., male   DOB: 12-26-1950, 72 y.o.   MRN: [id number]  HPI: Kenneth Korber Sr. is a 72 y.o.-year-old male, returning for follow-up for DM2, dx in 2016, insulin-dependent  since 2020, uncontrolled, with complications (CAD, history of CVA, CKD stage III). Pt. previously saw Dr. Everardo All, but last visit with me 4 months ago.  Interim history: No increased urination, nausea, chest pain. He has blurry vision - he thinks he needs to change his contacts. He lost 5 lbs before last visit, after seeing nutrition.  He lost 2 more since last visit.  He started to change his diet after his HbA1c returned elevated last month. He plays golf 2x a week.  Reviewed HbA1c: Lab Results  Component Value Date   HGBA1C 9.1 (H) 07/22/2022   HGBA1C 7.8 (A) 02/28/2022   HGBA1C 8.1 (H) 10/05/2021   HGBA1C 8.3 (A) 05/07/2021   HGBA1C 7.9 (H) 01/15/2021   HGBA1C 8.1 (A) 12/25/2020   HGBA1C 8.6 (A) 09/14/2020   HGBA1C 8.7 (H) 06/30/2020   HGBA1C 8.6 (H) 02/22/2020   HGBA1C 8.8 (H) 10/18/2019   She was previously on: - Basaglar 30 units at bedtime - Novolog 30-25-25 units (3x a day), immediately before meals: 10-20 units before B 15 units before L 15-20 unit before D He had nausea with metformin - stopped 2 mo ago. He tried Trulicity >> nausea.  Currently on: - Basaglar 35 >> 30 units at bedtime - Novolog: 15 minutes before meals: 10-20 units before B 10-15 units before L 15-20 unit before D - Ozempic 0.25 >> 0.5 mg weekly  Pt checks his sugars >4 a day with his CGM:  Previously:  Previously:  Lowest sugar was 46 >> 54; he has hypoglycemia awareness at 70.  Highest sugar was 400 >> 350.  Glucometer: One Touch ultra  - + CKD, last BUN/creatinine:  Lab Results  Component Value Date   BUN 18 07/22/2022   BUN 20 03/05/2022   CREATININE 1.15 07/22/2022   CREATININE 1.25 (H) 03/05/2022  On Diovan 80 mg daily.  -+HL; last set of lipids: Lab Results  Component Value Date    CHOL 150 01/21/2022   HDL 38 (L) 01/21/2022   LDLCALC 85 01/21/2022   LDLDIRECT 110 (H) 06/17/2019   TRIG 175 (H) 01/21/2022   CHOLHDL 3.9 01/21/2022  He is on Praluent.  He had side effects from statins in the past.  - last eye exam was 10/04/2021. No DR.   - +  restless leg syndrome.  He is on Neurontin. Also, on pramipexole for restless leg syndrome.  He sees neurology.  Last foot exam 10/22/2021.  He also has a history of HTN, GERD, Barrett's esophagus, iron deficiency anemia, memory loss.  ROS: + see HPI  Past Medical History:  Diagnosis Date   Arthritis    "left thumb" (05/23/2017) right shoulder   Barrett's esophagus    "we've been told that it's all gone; still take RX for GERD" (05/23/2017)   Bilateral swelling of feet and ankles x 3 weeks as of 12-01-2019   CAD (coronary artery disease), native coronary artery    Coronary angiogram 05/03/2014:  Proximal LAD 3.0x12 mm Promus premier stent.  04/08/2014: Mid Cx 3.5 x 16 mm Promus DES.  03/29/2013: Mid LAD 2.75 x 38 mm Promus Premier drug-eluting stent, balloon angioplasty of D1 and distal LAD and stenting of distal RCA with 2.75 x 24 mm promos Premier drug-eluting stent 12/15/2012 widely patent.   Cervicalgia  Chronic bronchitis (HCC)    "q yr" (05/23/2017)   Complication of anesthesia    " I do not wake up very well "  slow to wake up   COVID-19 05/2019   all covid symptoms in hospital x 5 days, all symptoms resolved took monoclonal antibody tx    Dyspnea    with exertion   Family history of adverse reaction to anesthesia    "son w/PONV"   GERD (gastroesophageal reflux disease)    Heart murmur    "grew out of it"    Hyperlipidemia    Hypertension    Hypoglycemia, unspecified    IDDM (insulin dependent diabetes mellitus)    type 2   Impotence of organic origin    Iron deficiency anemia    Memory loss    mild   Other malaise and fatigue    Pneumonia 05/2019   PONV (postoperative nausea and vomiting)    after wisdom  teeth pulled, no problems with other surgeries   Restless leg    Rotator cuff arthropathy    Stroke (HCC)    Tension headache    "sometimes" (05/23/2017)   Unspecified gastritis and gastroduodenitis with hemorrhage    Unstable angina pectoris (HCC) 04/08/2014   Coronary angiogram 05/03/2014:  Proximal LAD 3.0x12 mm Promus premier stent.  04/08/2014: Mid Cx 3.5 x 16 mm Promus DES.  03/29/2013: Mid LAD 2.75 x 38 mm Promus Premier drug-eluting stent, balloon angioplasty of D1 and distal LAD and stenting of distal RCA with 2.75 x 24 mm promos Premier drug-eluting stent 12/15/2012 widely patent.   Past Surgical History:  Procedure Laterality Date   BICEPT TENODESIS Right 12/07/2019   Procedure: RIGHT BICEPS TENODESIS;  Surgeon: Sheral Apley, MD;  Location: [facility];  Service: Orthopedics;  Laterality: Right;   CARDIAC CATHETERIZATION N/A 09/25/2015   Procedure: Left Heart Cath and Coronary Angiography;  Surgeon: [name], MD;  Location: [facility] INVASIVE CV LAB;  Service: Cardiovascular;  Laterality: N/A;   CARDIAC CATHETERIZATION N/A 09/25/2015   Procedure: Intravascular Pressure Wire/FFR Study;  Surgeon: [name], MD;  Location: [facility] INVASIVE CV LAB;  Service: Cardiovascular;  Laterality: N/A;   CORONARY ANGIOPLASTY WITH STENT PLACEMENT  12/15/2012; 04/28/2014; 05/03/2014   "2; 1; 1" , 4 stents 2 balloons   CORONARY STENT INTERVENTION N/A 05/01/2021   Procedure: CORONARY STENT INTERVENTION;  Surgeon: [name], MD;  Location: [facility] INVASIVE CV LAB;  Service: Cardiovascular;  Laterality: N/A;   FRACTIONAL FLOW RESERVE WIRE Right 03/26/2013   Procedure: FRACTIONAL FLOW RESERVE WIRE;  Surgeon: Pamella Pert, MD;  Location: [facility] CATH LAB;  Service: Cardiovascular;  Laterality: Right;   FRACTIONAL FLOW RESERVE WIRE N/A 05/03/2014   Procedure: FRACTIONAL FLOW RESERVE WIRE;  Surgeon: Pamella Pert, MD;  Location: [facility] CATH LAB;  Service: Cardiovascular;  Laterality: N/A;   HERNIA REPAIR   2010   "umbilical"    INCISION AND DRAINAGE ABSCESS Left 05/2007   "groin"    KNEE SURGERY Right 1992;  2000   "calcium deposits removed" (12/15/2012)   LEFT HEART CATH AND CORONARY ANGIOGRAPHY N/A 05/23/2017   Procedure: LEFT HEART CATH AND CORONARY ANGIOGRAPHY;  Surgeon: [name], MD;  Location: [facility] INVASIVE CV LAB;  Service: Cardiovascular;  Laterality: N/A;   LEFT HEART CATH AND CORONARY ANGIOGRAPHY N/A 05/01/2021   Procedure: LEFT HEART CATH AND CORONARY ANGIOGRAPHY;  Surgeon: [name], MD;  Location: [facility] INVASIVE CV LAB;  Service: Cardiovascular;  Laterality: N/A;   LEFT HEART  CATHETERIZATION WITH CORONARY ANGIOGRAM N/A 12/15/2012   Procedure: LEFT HEART CATHETERIZATION WITH CORONARY ANGIOGRAM;  Surgeon: Pamella Pert, MD;  Location: [facility] CATH LAB;  Service: Cardiovascular;  Laterality: N/A;   LEFT HEART CATHETERIZATION WITH CORONARY ANGIOGRAM N/A 03/26/2013   Procedure: LEFT HEART CATHETERIZATION WITH CORONARY ANGIOGRAM;  Surgeon: Pamella Pert, MD;  Location: [facility] CATH LAB;  Service: Cardiovascular;  Laterality: N/A;   LEFT HEART CATHETERIZATION WITH CORONARY ANGIOGRAM N/A 04/08/2014   Procedure: LEFT HEART CATHETERIZATION WITH CORONARY ANGIOGRAM;  Surgeon: Micheline Chapman, MD;  Location: [facility] CATH LAB;  Service: Cardiovascular;  Laterality: N/A;   MOUTH SURGERY Right    PERCUTANEOUS CORONARY STENT INTERVENTION (PCI-S) N/A 05/03/2014   Procedure: PERCUTANEOUS CORONARY STENT INTERVENTION (PCI-S);  Surgeon: Pamella Pert, MD;  Location: [facility] CATH LAB;  Service: Cardiovascular;  Laterality: N/A;   RADIOLOGY WITH ANESTHESIA N/A 02/08/2021   Procedure: MRI WITH ANESTHESIA OF ABDOMEN WITH AND WITHOUT CONSTRAST;  Surgeon: Radiologist, Medication, MD;  Location: [facility] OR;  Service: Radiology;  Laterality: N/A;   ROTATOR CUFF REPAIR  2022   UMBILICAL HERNIA REPAIR  yrs ago   Social History   Socioeconomic History   Marital status: Married    Spouse name: [name]   Number of children: 2    Years of education: 12   Highest education level: High school graduate  Occupational History   Occupation: Naval architect    Comment: deliveries only now   Occupation: woodworking  Tobacco Use   Smoking status: Never   Smokeless tobacco: Never  Vaping Use   Vaping Use: Never used  Substance and Sexual Activity   Alcohol use: No   Drug use: No   Sexual activity: Not Currently  Other Topics Concern   Not on file  Social History Narrative   Right handed    Caffeine 2 cups daily   Winter time 4 cups daily   Lives family one level home    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   Financial Resource Strain: Low Risk  (01/18/2021)   Overall Financial Resource Strain (CARDIA)    Difficulty of Paying Living Expenses: Not very hard  Food Insecurity: No Food Insecurity (01/18/2021)   Hunger Vital Sign    Worried About Running Out of Food in the Last Year: Never true    Ran Out of Food in the Last Year: Never true  Transportation Needs: No Transportation Needs (01/18/2021)   PRAPARE - Administrator, Civil Service (Medical): No    Lack of Transportation (Non-Medical): No  Physical Activity: Inactive (04/25/2017)   Exercise Vital Sign    Days of Exercise per Week: 0 days    Minutes of Exercise per Session: 0 min  Stress: No Stress Concern Present (04/25/2017)   [employer] of Occupational Health - Occupational Stress Questionnaire    Feeling of Stress : Only a little  Social Connections: Moderately Integrated (04/25/2017)   Social Connection and Isolation Panel [NHANES]    Frequency of Communication with Friends and Family: More than three times a week    Frequency of Social Gatherings with Friends and Family: More than three times a week    Attends Religious Services: More than 4 times per year    Active Member of [employer] or Organizations: No    Attends Banker Meetings: Never    Marital Status: Married  Catering manager Violence: Not At Risk  (04/25/2017)   Humiliation, Afraid, Rape, and Kick questionnaire  Fear of Current or Ex-Partner: No    Emotionally Abused: No    Physically Abused: No    Sexually Abused: No   Current Outpatient Medications on File Prior to Visit  Medication Sig Dispense Refill   acetaminophen (TYLENOL) 500 MG tablet Take 500 mg by mouth every 6 (six) hours as needed for mild pain, moderate pain, headache or fever.     albuterol (VENTOLIN HFA) 108 (90 Base) MCG/ACT inhaler TAKE 2 PUFFS BY MOUTH EVERY 6 HOURS AS NEEDED FOR WHEEZE OR SHORTNESS OF BREATH 6.7 each 2   aspirin EC 81 MG tablet Take 1 tablet (81 mg total) by mouth daily. Swallow whole. 90 tablet 3   benzonatate (TESSALON PERLES) 100 MG capsule Take 2 capsules (200 mg total) by mouth 3 (three) times daily as needed for cough. (Patient not taking: Reported on 07/24/2022) 30 capsule 0   Budeson-Glycopyrrol-Formoterol (BREZTRI AEROSPHERE) 160-9-4.8 MCG/ACT AERO Inhale 2 puffs into the lungs in the morning and at bedtime. 5.9 g 0   clopidogrel (PLAVIX) 75 MG tablet Take 1 tablet (75 mg total) by mouth daily. 90 tablet 3   Continuous Blood Gluc Receiver (FREESTYLE LIBRE 14 DAY READER) DEVI Inject 1 Device as directed daily as needed. Use once daily as direct to check blood sugar E11.51 1 each 0   Continuous Blood Gluc Sensor (FREESTYLE LIBRE 14 DAY SENSOR) MISC USE TO TEST BLOOD SUGAR THREE TIMES DAILY. E11.51. E11.59 1 each 12   diphenhydramine-acetaminophen (TYLENOL PM) 25-500 MG TABS tablet Take 1 tablet by mouth at bedtime as needed.     ferrous sulfate 325 (65 FE) MG EC tablet Take 1 tablet (325 mg total) by mouth daily. 30 tablet 11   fluticasone (FLONASE) 50 MCG/ACT nasal spray Place 1 spray into both nostrils daily as needed for allergies or rhinitis. 16 g 11   gabapentin (NEURONTIN) 100 MG capsule Take 300-400 mg by mouth at bedtime.     glucose 4 GM chewable tablet Chew 1 tablet (4 g total) by mouth as needed for low blood sugar. 50 tablet 12    insulin aspart (NOVOLOG FLEXPEN) 100 UNIT/ML FlexPen INJECT 25-30 UNITS INTO THE SKIN SEE ADMIN INSTRUCTIONS. INJECT 30 UNITS SUBCUTANEOUSLY IN THE MORNING, INJECT 25 UNITS SUBCUTANEOUSLY WITH LUNCH & INJECT 25 UNITS SUBCUTANEOUSLY WITH DINNER. 30 mL 1   Insulin Glargine (BASAGLAR KWIKPEN) 100 UNIT/ML Inject 30 Units into the skin at bedtime. 30 mL 3   Insulin Pen Needle (BD PEN NEEDLE NANO 2ND GEN) 32G X 4 MM MISC USE AS DIRECTED 100 each 3   isosorbide mononitrate (IMDUR) 60 MG 24 hr tablet TAKE 1 TABLET BY MOUTH EVERY DAY 90 tablet 1   levocetirizine (XYZAL) 5 MG tablet Take 1 tablet (5 mg total) by mouth every evening. 30 tablet 2   LIFESCAN FINEPOINT LANCETS MISC Use to test for blood sugar three times daily dx: E11.51 300 each 1   metoprolol succinate (TOPROL-XL) 25 MG 24 hr tablet TAKE 1 TABLET BY MOUTH EVERY DAY WITH OR IMMEDIATELY FOLLOWING A MEAL 90 tablet 1   nitroGLYCERIN (NITROSTAT) 0.4 MG SL tablet PLACE 1 TABLET UNDER THE TONGUE EVERY 5 MINUTES AS NEEDED FOR CHEST PAIN. 50 tablet 0   ONETOUCH ULTRA test strip USE TO TEST FOR BLOOD SUGAR THREE TIMES DAILY DX: E11.51 300 strip 11   pantoprazole (PROTONIX) 40 MG tablet TAKE ONE TABLET BY MOUTH ONCE DAILY FOR STOMACH 90 tablet 3   PRALUENT 150 MG/ML SOAJ INJECT 1 PEN INTO THE  SKIN EVERY 14 (FOURTEEN) DAYS. 6 mL 1   pramipexole (MIRAPEX) 0.5 MG tablet Take 0.5 mg by mouth as needed.     Semaglutide,0.25 or 0.5MG /DOS, (OZEMPIC, 0.25 OR 0.5 MG/DOSE,) 2 MG/3ML SOPN Inject 0.25 mg into the skin once a week. 3 mL 1   valsartan (DIOVAN) 160 MG tablet TAKE 1 TABLET (160 MG TOTAL) BY MOUTH EVERY EVENING. 90 tablet 3   Current Facility-Administered Medications on File Prior to Visit  Medication Dose Route Frequency Provider Last Rate Last Admin   [COMPLETED] hydrALAZINE (APRESOLINE) injection 10 mg  10 mg Intravenous Once [name], MD   10 mg at 05/01/21 1054   Allergies  Allergen Reactions   Amlodipine Other (See Comments)    Worsening  restless legs, dyspnea and leg swelling   Lyrica [Pregabalin] Other (See Comments)    Made patient very lethargic the next morning, very hard to patient to function, move, etc.    Other Other (See Comments)    Convulsions, Causes Restless Legs, rhabdomyolysis   Statins Other (See Comments)    Causes Restless Legs, rhabdomyolysis   Ativan [Lorazepam] Other (See Comments)    Markedly increased restless legs   Trulicity [Dulaglutide] Other (See Comments)    GI side effects    Family History  Adopted: Yes  Problem Relation Age of Onset   Emphysema Mother    PE: BP 132/80   Pulse 71   Ht 5\' 8"  (1.727 m)   Wt 200 lb 6.4 oz (90.9 kg)   SpO2 96%   BMI 30.47 kg/m  Wt Readings from Last 3 Encounters:  08/19/22 200 lb 6.4 oz (90.9 kg)  07/24/22 202 lb 9.6 oz (91.9 kg)  07/22/22 204 lb (92.5 kg)   Constitutional: overweight, in NAD Eyes: no exophthalmos ENT: no thyromegaly, no cervical lymphadenopathy, + mild nonpitting B LE edema Cardiovascular: RRR, No MRG Respiratory: CTA B Musculoskeletal: no deformities Skin:  no rashes Neurological: no tremor with outstretched hands  ASSESSMENT: 1. DM2, insulin-dependent, uncontrolled, with complications - CAD - h/o CVA - CKD stage III - severe hypoglycemia in 2019  2. HL  PLAN:  1. Patient with longstanding, uncontrolled, type 2 diabetes, on basal/bolus insulin regimen along with weekly GLP-1 receptor agonist, dose increased at last visit.  At that time, HbA1c was slightly better, at 7.8%, but still above goal.  However, since then, last month, he had an HbA1c that was higher, at 9.1%. -Of note, he had nausea with Trulicity in the past but he tolerates Ozempic well.  I previously suggested a change to [place] U200 but he had a lot of Basaglar at home so we did not switch. -At last visit, reviewing his CGM trends, they were mostly fluctuating within the target range with hyperglycemic spikes after breakfast, lunch, and dinner.  He was  taking a low-dose of NovoLog before the meal and then an extra dose when the sugars were increasing after the meal.  We discussed about taking the entire NovoLog for the meal 15 minutes before he started eating.  I also advised him to increase the Ozempic dose. CGM interpretation: -At today's visit, we reviewed his CGM downloads: It appears that 64% of values are in target range (goal >70%), while 35% are higher than 180 (goal <25%), and 0% are lower than 70 (goal <4%).  The calculated average blood sugar is 164.  The projected HbA1c for the next 3 months (GMI) is 7.2%. -Reviewing the CGM trends, sugars are decreasing more drastically after dinner  and after lunch and upon questioning, he is still mostly taking his follow-up for correction after meals.  We discussed about the importance of moving the entire dose of NovoLog before the meals and he will try to do so.  Otherwise, we can continue the same regimen for now.  Overall, sugars appear to be much improved with a predicted HbA1c of 7.2%, decreased from 9.1%. - I suggested to:  Patient Instructions  Please continue: - Basaglar 30 units daily - Novolog 10-20 units 15 min before the meals - Ozempic 0.5 mg weekly  Please return in 4 months.  - advised to check sugars at different times of the day - 4x a day, rotating check times - advised for yearly eye exams >> he is UTD - return to clinic in 3-4 months   2. HL -Reviewed latest lipid panel from 01/2022: LDL above target, triglycerides also slightly high, HDL low Lab Results  Component Value Date   CHOL 150 01/21/2022   HDL 38 (L) 01/21/2022   LDLCALC 85 01/21/2022   LDLDIRECT 110 (H) 06/17/2019   TRIG 175 (H) 01/21/2022   CHOLHDL 3.9 01/21/2022  -He is on Praluent without side effects; he previously had side effects from statins: Restless legs and rhabdomyolysis  Carlus Pavlov, MD PhD [facility] Endocrinology

## 2022-08-19 NOTE — Patient Instructions (Addendum)
Please continue: - Basaglar 30 units daily - Novolog 10-20 units 15 min before the meals - Ozempic 0.5 mg weekly  Please return in 4 months.

## 2022-09-09 ENCOUNTER — Encounter: Payer: Medicare Other | Admitting: Adult Health

## 2022-09-09 ENCOUNTER — Encounter: Payer: Self-pay | Admitting: Adult Health

## 2022-09-09 NOTE — Progress Notes (Signed)
This encounter was created in error - please disregard.

## 2022-09-12 ENCOUNTER — Ambulatory Visit (INDEPENDENT_AMBULATORY_CARE_PROVIDER_SITE_OTHER): Payer: Medicare Other | Admitting: Adult Health

## 2022-09-12 ENCOUNTER — Encounter: Payer: Self-pay | Admitting: Adult Health

## 2022-09-12 VITALS — BP 132/76 | HR 66 | Temp 97.8°F | Resp 17 | Ht 68.0 in | Wt 204.2 lb

## 2022-09-12 DIAGNOSIS — E66811 Obesity, class 1: Secondary | ICD-10-CM

## 2022-09-12 DIAGNOSIS — J410 Simple chronic bronchitis: Secondary | ICD-10-CM

## 2022-09-12 DIAGNOSIS — Z6831 Body mass index (BMI) 31.0-31.9, adult: Secondary | ICD-10-CM

## 2022-09-12 DIAGNOSIS — I10 Essential (primary) hypertension: Secondary | ICD-10-CM | POA: Diagnosis not present

## 2022-09-12 DIAGNOSIS — E1165 Type 2 diabetes mellitus with hyperglycemia: Secondary | ICD-10-CM | POA: Diagnosis not present

## 2022-09-12 DIAGNOSIS — I25118 Atherosclerotic heart disease of native coronary artery with other forms of angina pectoris: Secondary | ICD-10-CM | POA: Diagnosis not present

## 2022-09-12 DIAGNOSIS — E669 Obesity, unspecified: Secondary | ICD-10-CM

## 2022-09-12 DIAGNOSIS — R21 Rash and other nonspecific skin eruption: Secondary | ICD-10-CM | POA: Diagnosis not present

## 2022-09-12 DIAGNOSIS — Z794 Long term (current) use of insulin: Secondary | ICD-10-CM | POA: Diagnosis not present

## 2022-09-12 DIAGNOSIS — G459 Transient cerebral ischemic attack, unspecified: Secondary | ICD-10-CM

## 2022-09-12 MED ORDER — ALBUTEROL SULFATE HFA 108 (90 BASE) MCG/ACT IN AERS
2.0000 | INHALATION_SPRAY | Freq: Four times a day (QID) | RESPIRATORY_TRACT | 2 refills | Status: DC | PRN
Start: 2022-09-12 — End: 2022-11-26

## 2022-09-12 NOTE — Progress Notes (Signed)
Location:  [facility]  Provider:  Kenard Gower DNP  Code Status:  Full Code  Goals of Care:     09/12/2022    9:06 AM  Advanced Directives  Does Patient Have a Medical Advance Directive? No  Would patient like information on creating a medical advance directive? No - Patient declined     Chief Complaint  Patient presents with   Acute Visit    Patient is being seen for a rash that was on his nose and states he need a referral    Quality Metric Gaps    Patient will be due for AWV 10/13/2022    HPI: Patient is a 72 y.o. male seen today for an acute visit regarding nasal rash. He stated that he started having nasal bridge rash since he was in his 24s. Sometimes it bleeds and sometimes it goes away. Last week, it bled a lot after rubbing it with a wet washcloth.  Sometimes it bleeds on its own without him touching it. He takes Plavix for TIA and CAD. He denies chest pains. He has a Libre 3 on his left upper arm which reads current CBG 125. Last A1C 9.1. He takes Novolog, Glargine and Semaglutide for diabetes mellitus.  He is requesting referral to a dermatologist.   Past Medical History:  Diagnosis Date   Arthritis    "left thumb" (05/23/2017) right shoulder   Barrett's esophagus    "we've been told that it's all gone; still take RX for GERD" (05/23/2017)   Bilateral swelling of feet and ankles x 3 weeks as of 12-01-2019   CAD (coronary artery disease), native coronary artery    Coronary angiogram 05/03/2014:  Proximal LAD 3.0x12 mm Promus premier stent.  04/08/2014: Mid Cx 3.5 x 16 mm Promus DES.  03/29/2013: Mid LAD 2.75 x 38 mm Promus Premier drug-eluting stent, balloon angioplasty of D1 and distal LAD and stenting of distal RCA with 2.75 x 24 mm promos Premier drug-eluting stent 12/15/2012 widely patent.   Cervicalgia    Chronic bronchitis (HCC)    "q yr" (05/23/2017)   Complication of anesthesia    " I do not wake up very well "  slow to wake up   COVID-19 05/2019   all  covid symptoms in hospital x 5 days, all symptoms resolved took monoclonal antibody tx    Dyspnea    with exertion   Family history of adverse reaction to anesthesia    "son w/PONV"   GERD (gastroesophageal reflux disease)    Heart murmur    "grew out of it"    Hyperlipidemia    Hypertension    Hypoglycemia, unspecified    IDDM (insulin dependent diabetes mellitus)    type 2   Impotence of organic origin    Iron deficiency anemia    Memory loss    mild   Other malaise and fatigue    Pneumonia 05/2019   PONV (postoperative nausea and vomiting)    after wisdom teeth pulled, no problems with other surgeries   Restless leg    Rotator cuff arthropathy    Stroke (HCC)    Tension headache    "sometimes" (05/23/2017)   Unspecified gastritis and gastroduodenitis with hemorrhage    Unstable angina pectoris (HCC) 04/08/2014   Coronary angiogram 05/03/2014:  Proximal LAD 3.0x12 mm Promus premier stent.  04/08/2014: Mid Cx 3.5 x 16 mm Promus DES.  03/29/2013: Mid LAD 2.75 x 38 mm Promus Premier drug-eluting stent, balloon angioplasty of D1  and distal LAD and stenting of distal RCA with 2.75 x 24 mm promos Premier drug-eluting stent 12/15/2012 widely patent.    Past Surgical History:  Procedure Laterality Date   BICEPT TENODESIS Right 12/07/2019   Procedure: RIGHT BICEPS TENODESIS;  Surgeon: Sheral Apley, MD;  Location: [facility];  Service: Orthopedics;  Laterality: Right;   CARDIAC CATHETERIZATION N/A 09/25/2015   Procedure: Left Heart Cath and Coronary Angiography;  Surgeon: [name], MD;  Location: [facility] INVASIVE CV LAB;  Service: Cardiovascular;  Laterality: N/A;   CARDIAC CATHETERIZATION N/A 09/25/2015   Procedure: Intravascular Pressure Wire/FFR Study;  Surgeon: [name], MD;  Location: [facility] INVASIVE CV LAB;  Service: Cardiovascular;  Laterality: N/A;   CORONARY ANGIOPLASTY WITH STENT PLACEMENT  12/15/2012; 04/28/2014; 05/03/2014   "2; 1; 1" , 4 stents 2 balloons   CORONARY  STENT INTERVENTION N/A 05/01/2021   Procedure: CORONARY STENT INTERVENTION;  Surgeon: [name], MD;  Location: [facility] INVASIVE CV LAB;  Service: Cardiovascular;  Laterality: N/A;   FRACTIONAL FLOW RESERVE WIRE Right 03/26/2013   Procedure: FRACTIONAL FLOW RESERVE WIRE;  Surgeon: Pamella Pert, MD;  Location: [facility] CATH LAB;  Service: Cardiovascular;  Laterality: Right;   FRACTIONAL FLOW RESERVE WIRE N/A 05/03/2014   Procedure: FRACTIONAL FLOW RESERVE WIRE;  Surgeon: Pamella Pert, MD;  Location: [facility] CATH LAB;  Service: Cardiovascular;  Laterality: N/A;   HERNIA REPAIR  2010   "umbilical"    INCISION AND DRAINAGE ABSCESS Left 05/2007   "groin"    KNEE SURGERY Right 1992;  2000   "calcium deposits removed" (12/15/2012)   LEFT HEART CATH AND CORONARY ANGIOGRAPHY N/A 05/23/2017   Procedure: LEFT HEART CATH AND CORONARY ANGIOGRAPHY;  Surgeon: [name], MD;  Location: [facility] INVASIVE CV LAB;  Service: Cardiovascular;  Laterality: N/A;   LEFT HEART CATH AND CORONARY ANGIOGRAPHY N/A 05/01/2021   Procedure: LEFT HEART CATH AND CORONARY ANGIOGRAPHY;  Surgeon: [name], MD;  Location: [facility] INVASIVE CV LAB;  Service: Cardiovascular;  Laterality: N/A;   LEFT HEART CATHETERIZATION WITH CORONARY ANGIOGRAM N/A 12/15/2012   Procedure: LEFT HEART CATHETERIZATION WITH CORONARY ANGIOGRAM;  Surgeon: Pamella Pert, MD;  Location: [facility] CATH LAB;  Service: Cardiovascular;  Laterality: N/A;   LEFT HEART CATHETERIZATION WITH CORONARY ANGIOGRAM N/A 03/26/2013   Procedure: LEFT HEART CATHETERIZATION WITH CORONARY ANGIOGRAM;  Surgeon: Pamella Pert, MD;  Location: [facility] CATH LAB;  Service: Cardiovascular;  Laterality: N/A;   LEFT HEART CATHETERIZATION WITH CORONARY ANGIOGRAM N/A 04/08/2014   Procedure: LEFT HEART CATHETERIZATION WITH CORONARY ANGIOGRAM;  Surgeon: Micheline Chapman, MD;  Location: [facility] CATH LAB;  Service: Cardiovascular;  Laterality: N/A;   MOUTH SURGERY Right    PERCUTANEOUS CORONARY STENT INTERVENTION  (PCI-S) N/A 05/03/2014   Procedure: PERCUTANEOUS CORONARY STENT INTERVENTION (PCI-S);  Surgeon: Pamella Pert, MD;  Location: [facility] CATH LAB;  Service: Cardiovascular;  Laterality: N/A;   RADIOLOGY WITH ANESTHESIA N/A 02/08/2021   Procedure: MRI WITH ANESTHESIA OF ABDOMEN WITH AND WITHOUT CONSTRAST;  Surgeon: Radiologist, Medication, MD;  Location: [facility] OR;  Service: Radiology;  Laterality: N/A;   ROTATOR CUFF REPAIR  2022   UMBILICAL HERNIA REPAIR  yrs ago    Allergies  Allergen Reactions   Amlodipine Other (See Comments)    Worsening restless legs, dyspnea and leg swelling   Lyrica [Pregabalin] Other (See Comments)    Made patient very lethargic the next morning, very hard to patient to function, move, etc.    Other Other (See Comments)  Convulsions, Causes Restless Legs, rhabdomyolysis   Statins Other (See Comments)    Causes Restless Legs, rhabdomyolysis   Ativan [Lorazepam] Other (See Comments)    Markedly increased restless legs   Trulicity [Dulaglutide] Other (See Comments)    GI side effects     Outpatient Encounter Medications as of 09/12/2022  Medication Sig   acetaminophen (TYLENOL) 500 MG tablet Take 500 mg by mouth every 6 (six) hours as needed for mild pain, moderate pain, headache or fever.   albuterol (VENTOLIN HFA) 108 (90 Base) MCG/ACT inhaler TAKE 2 PUFFS BY MOUTH EVERY 6 HOURS AS NEEDED FOR WHEEZE OR SHORTNESS OF BREATH   aspirin EC 81 MG tablet Take 1 tablet (81 mg total) by mouth daily. Swallow whole.   benzonatate (TESSALON PERLES) 100 MG capsule Take 2 capsules (200 mg total) by mouth 3 (three) times daily as needed for cough.   Budeson-Glycopyrrol-Formoterol (BREZTRI AEROSPHERE) 160-9-4.8 MCG/ACT AERO Inhale 2 puffs into the lungs in the morning and at bedtime.   clopidogrel (PLAVIX) 75 MG tablet Take 1 tablet (75 mg total) by mouth daily.   Continuous Blood Gluc Receiver (FREESTYLE LIBRE 14 DAY READER) DEVI Inject 1 Device as directed daily as needed. Use  once daily as direct to check blood sugar E11.51   Continuous Blood Gluc Sensor (FREESTYLE LIBRE 14 DAY SENSOR) MISC USE TO TEST BLOOD SUGAR THREE TIMES DAILY. E11.51. E11.59   diphenhydramine-acetaminophen (TYLENOL PM) 25-500 MG TABS tablet Take 1 tablet by mouth at bedtime as needed.   ferrous sulfate 325 (65 FE) MG EC tablet Take 1 tablet (325 mg total) by mouth daily.   fluticasone (FLONASE) 50 MCG/ACT nasal spray Place 1 spray into both nostrils daily as needed for allergies or rhinitis.   gabapentin (NEURONTIN) 100 MG capsule Take 300-400 mg by mouth at bedtime.   glucose 4 GM chewable tablet Chew 1 tablet (4 g total) by mouth as needed for low blood sugar.   insulin aspart (NOVOLOG FLEXPEN) 100 UNIT/ML FlexPen INJECT 25-30 UNITS INTO THE SKIN SEE ADMIN INSTRUCTIONS. INJECT 30 UNITS SUBCUTANEOUSLY IN THE MORNING, INJECT 25 UNITS SUBCUTANEOUSLY WITH LUNCH & INJECT 25 UNITS SUBCUTANEOUSLY WITH DINNER.   Insulin Glargine (BASAGLAR KWIKPEN) 100 UNIT/ML Inject 30 Units into the skin at bedtime.   Insulin Pen Needle (BD PEN NEEDLE NANO 2ND GEN) 32G X 4 MM MISC USE AS DIRECTED   isosorbide mononitrate (IMDUR) 60 MG 24 hr tablet TAKE 1 TABLET BY MOUTH EVERY DAY   levocetirizine (XYZAL) 5 MG tablet Take 1 tablet (5 mg total) by mouth every evening.   LIFESCAN FINEPOINT LANCETS MISC Use to test for blood sugar three times daily dx: E11.51   metoprolol succinate (TOPROL-XL) 25 MG 24 hr tablet TAKE 1 TABLET BY MOUTH EVERY DAY WITH OR IMMEDIATELY FOLLOWING A MEAL   nitroGLYCERIN (NITROSTAT) 0.4 MG SL tablet PLACE 1 TABLET UNDER THE TONGUE EVERY 5 MINUTES AS NEEDED FOR CHEST PAIN.   ONETOUCH ULTRA test strip USE TO TEST FOR BLOOD SUGAR THREE TIMES DAILY DX: E11.51   pantoprazole (PROTONIX) 40 MG tablet TAKE ONE TABLET BY MOUTH ONCE DAILY FOR STOMACH   PRALUENT 150 MG/ML SOAJ INJECT 1 PEN INTO THE SKIN EVERY 14 (FOURTEEN) DAYS.   pramipexole (MIRAPEX) 0.5 MG tablet Take 0.5 mg by mouth as needed.    Semaglutide,0.25 or 0.5MG /DOS, (OZEMPIC, 0.25 OR 0.5 MG/DOSE,) 2 MG/3ML SOPN Inject 0.25 mg into the skin once a week.   valsartan (DIOVAN) 160 MG tablet TAKE 1 TABLET (160  MG TOTAL) BY MOUTH EVERY EVENING.   Facility-Administered Encounter Medications as of 09/12/2022  Medication   [COMPLETED] hydrALAZINE (APRESOLINE) injection 10 mg    Review of Systems:  Review of Systems  Constitutional:  Negative for activity change, appetite change and fever.  HENT:  Negative for sore throat.   Eyes: Negative.   Cardiovascular:  Negative for chest pain and leg swelling.  Gastrointestinal:  Negative for abdominal distention, diarrhea and vomiting.  Genitourinary:  Negative for dysuria, frequency and urgency.  Skin:  Positive for rash. Negative for color change.  Neurological:  Negative for dizziness and headaches.  Psychiatric/Behavioral:  Negative for behavioral problems and sleep disturbance. The patient is not nervous/anxious.     Health Maintenance  Topic Date Due   Medicare Annual Wellness (AWV)  10/12/2022   OPHTHALMOLOGY EXAM  10/05/2022   FOOT EXAM  10/23/2022   HEMOGLOBIN A1C  01/22/2023   Diabetic kidney evaluation - eGFR measurement  07/22/2023   Diabetic kidney evaluation - Urine ACR  07/22/2023   Colonoscopy  10/18/2025   DTaP/Tdap/Td (5 - Td or Tdap) 03/02/2027   Hepatitis C Screening  Completed   HPV VACCINES  Aged Out   Pneumonia Vaccine 43+ Years old  Discontinued   INFLUENZA VACCINE  Discontinued   COVID-19 Vaccine  Discontinued   Zoster Vaccines- Shingrix  Discontinued    Physical Exam: Vitals:   09/12/22 0905  Pulse: 66  Resp: 17  Temp: 97.8 F (36.6 C)  TempSrc: Temporal  SpO2: 97%  Weight: 204 lb 3.2 oz (92.6 kg)  Height: 5\' 8"  (1.727 m)   Body mass index is 31.05 kg/m. Physical Exam Constitutional:      General: He is not in acute distress.    Appearance: He is obese.  HENT:     Head: Normocephalic and atraumatic.     Mouth/Throat:     Mouth:  Mucous membranes are moist.  Eyes:     Conjunctiva/sclera: Conjunctivae normal.  Cardiovascular:     Rate and Rhythm: Normal rate and regular rhythm.     Pulses: Normal pulses.     Heart sounds: Normal heart sounds.  Pulmonary:     Effort: Pulmonary effort is normal.     Breath sounds: Normal breath sounds.  Abdominal:     General: Bowel sounds are normal.     Palpations: Abdomen is soft.  Musculoskeletal:        General: No swelling. Normal range of motion.     Cervical back: Normal range of motion.  Skin:    General: Skin is warm and dry.     Findings: Rash present.     Comments: Rash on bridge of nose (flat), slight erythema  Neurological:     General: No focal deficit present.     Mental Status: He is alert and oriented to person, place, and time.  Psychiatric:        Mood and Affect: Mood normal.        Behavior: Behavior normal.        Thought Content: Thought content normal.        Judgment: Judgment normal.     Labs reviewed: Basic Metabolic Panel: Recent Labs    01/01/22 0936 03/02/22 1854 03/05/22 1513 07/22/22 0903  NA  --  139 137 141  K  --  4.1 4.5 4.9  CL  --  103 103 104  CO2  --  30 26 29   GLUCOSE  --  88 110* 154*  BUN  --  18 20 18   CREATININE  --  1.29* 1.25* 1.15  CALCIUM  --  10.0 9.2 9.5  MG 2.1 2.2  --   --   PHOS  --  4.3  --   --    Liver Function Tests: Recent Labs    10/22/21 1543 03/05/22 1513 07/22/22 0903  AST 24 44* 25  ALT 30 41 38  ALKPHOS 54 56  --   BILITOT 0.6 0.9 0.6  PROT 6.6 6.9 6.7  ALBUMIN 4.1 4.1  --    No results for input(s): "LIPASE", "AMYLASE" in the last 8760 hours. No results for input(s): "AMMONIA" in the last 8760 hours. CBC: Recent Labs    10/05/21 0859 10/22/21 1543 03/02/22 1854 03/05/22 1513 07/22/22 0903  WBC 5.2   < > 7.7 7.2 4.8  NEUTROABS 3,557  --   --  5.1 2,894  HGB 13.0*   < > 15.7 14.6 15.2  HCT 39.4   < > 45.6 43.8 44.5  MCV 86.6   < > 89.1 89.9 89.5  PLT 133*   < > 125* 128*  124*   < > = values in this interval not displayed.   Lipid Panel: Recent Labs    01/21/22 0857  CHOL 150  HDL 38*  LDLCALC 85  TRIG 540*  CHOLHDL 3.9   Lab Results  Component Value Date   HGBA1C 9.1 (H) 07/22/2022    Procedures since last visit: No results found.  Assessment/Plan  1. Rash -  slight erythema of rash on bridge of nose, not bleeding at present but bleeds on and off - Ambulatory referral to Dermatology  2. TIA (transient ischemic attack) -  stable -  continue Plavix and Praluent  3. Coronary artery disease involving native coronary artery of native heart with other form of angina pectoris (HCC) -  denies chest pains -  continue Imdur, NTG PRN  4. Essential hypertension -  BP 132/76, stable -  continue Hydralazine, Toprol XL and Diovan  5. Type 2 diabetes mellitus with hyperglycemia, with long-term current use of insulin (HCC) Lab Results  Component Value Date   HGBA1C 9.1 (H) 07/22/2022    -  has continuous blood sugar device (Libre 3) on left upper arm, current CBG 125 -  stable -  continue Novolog, Glargine and Sumaglutide  6. Class 1 obesity with serious comorbidity and body mass index (BMI) of 31.0 to 31.9 in adult, unspecified obesity type Body mass index is 31.05 kg/m. -   plays golf 2X/week -  plans to use his treadmill at home  7. Simple chronic bronchitis (HCC) -  no flare ups - albuterol (VENTOLIN HFA) 108 (90 Base) MCG/ACT inhaler; Inhale 2 puffs into the lungs every 6 (six) hours as needed for wheezing or shortness of breath.  Dispense: 6.7 each; Refill: 2    Labs/tests ordered:   None  Next appt:  10/28/2022

## 2022-09-18 ENCOUNTER — Other Ambulatory Visit: Payer: Self-pay | Admitting: Internal Medicine

## 2022-09-18 DIAGNOSIS — E119 Type 2 diabetes mellitus without complications: Secondary | ICD-10-CM

## 2022-09-24 ENCOUNTER — Ambulatory Visit: Payer: Medicare Other | Admitting: Cardiology

## 2022-09-24 NOTE — Progress Notes (Deleted)
Primary Physician/Referring:  [name], NP  Patient ID: Kenneth Sic Sr., male    DOB: 04-28-1950, 72 y.o.   MRN: [id number]  No chief complaint on file.  HPI:    Kenneth Bernard Sr.  is a 72 y.o. Caucasian male with coronary artery disease, with stents to his LAD, RCA and circumflex, remote balloon angioplasty to distal LAD and diagonal has remained patent,  hypertension, hyperlipidemia, mild chronic thrombocytopenia, GERD, Barrett's esophagus, statin intolerance, uncontrolled diabetes mellitus, severe restless leg syndrome.  He also had florid COVID 19 infection in February 2021.  Patient presents for 76-month follow-up visit.  He has not had any further episodes of chest pain and he has not used any sublingual nitroglycerin.  States that his restless leg has gotten severe to the point where he has been in the emergency room twice due to severe restless leg symptoms.  Past Medical History:  Diagnosis Date   Arthritis    "left thumb" (05/23/2017) right shoulder   Barrett's esophagus    "we've been told that it's all gone; still take RX for GERD" (05/23/2017)   Bilateral swelling of feet and ankles x 3 weeks as of 12-01-2019   CAD (coronary artery disease), native coronary artery    Coronary angiogram 05/03/2014:  Proximal LAD 3.0x12 mm Promus premier stent.  04/08/2014: Mid Cx 3.5 x 16 mm Promus DES.  03/29/2013: Mid LAD 2.75 x 38 mm Promus Premier drug-eluting stent, balloon angioplasty of D1 and distal LAD and stenting of distal RCA with 2.75 x 24 mm promos Premier drug-eluting stent 12/15/2012 widely patent.   Cervicalgia    Chronic bronchitis (HCC)    "q yr" (05/23/2017)   Complication of anesthesia    " I do not wake up very well "  slow to wake up   COVID-19 05/2019   all covid symptoms in hospital x 5 days, all symptoms resolved took monoclonal antibody tx    Dyspnea    with exertion   Family history of adverse reaction to anesthesia    "son w/PONV"   GERD (gastroesophageal reflux  disease)    Heart murmur    "grew out of it"    Hyperlipidemia    Hypertension    Hypoglycemia, unspecified    IDDM (insulin dependent diabetes mellitus)    type 2   Impotence of organic origin    Iron deficiency anemia    Memory loss    mild   Other malaise and fatigue    Pneumonia 05/2019   PONV (postoperative nausea and vomiting)    after wisdom teeth pulled, no problems with other surgeries   Restless leg    Rotator cuff arthropathy    Stroke (HCC)    Tension headache    "sometimes" (05/23/2017)   Unspecified gastritis and gastroduodenitis with hemorrhage    Unstable angina pectoris (HCC) 04/08/2014   Coronary angiogram 05/03/2014:  Proximal LAD 3.0x12 mm Promus premier stent.  04/08/2014: Mid Cx 3.5 x 16 mm Promus DES.  03/29/2013: Mid LAD 2.75 x 38 mm Promus Premier drug-eluting stent, balloon angioplasty of D1 and distal LAD and stenting of distal RCA with 2.75 x 24 mm promos Premier drug-eluting stent 12/15/2012 widely patent.   ROS  Review of Systems  Cardiovascular:  Positive for leg swelling (Occasional). Negative for chest pain (resolved), claudication, dyspnea on exertion and palpitations.  Neurological:  Negative for dizziness.    Objective      09/12/2022    9:05 AM 08/19/2022  8:18 AM 07/24/2022    9:25 AM  Vitals with BMI  Height 5\' 8"  5\' 8"    Weight 204 lbs 3 oz 200 lbs 6 oz   BMI 31.06 30.48   Systolic 132 132 409  Diastolic 76 80 70  Pulse 66 71     There were no vitals taken for this visit. There is no height or weight on file to calculate BMI.   Physical Exam Vitals reviewed.  Constitutional:      Appearance: He is well-developed.  Neck:     Vascular: No carotid bruit or JVD.  Cardiovascular:     Rate and Rhythm: Normal rate and regular rhythm.     Pulses:          Carotid pulses are 2+ on the right side and 2+ on the left side.      Femoral pulses are 2+ on the right side and 2+ on the left side.      Dorsalis pedis pulses are 0 on the right  side and 0 on the left side.       Posterior tibial pulses are 1+ on the right side and 0 on the left side.     Heart sounds: Murmur heard.     Blowing decrescendo early diastolic murmur is present with a grade of 2/4 at the upper right sternal border radiating to the apex.     No gallop.  Pulmonary:     Effort: Pulmonary effort is normal.     Breath sounds: Normal breath sounds.  Abdominal:     General: Bowel sounds are normal.     Palpations: Abdomen is soft.  Musculoskeletal:     Right lower leg: Edema (1+) present.     Left lower leg: Edema (1+) present.    Laboratory examination:   Recent Labs    01/01/22 0513 03/02/22 1854 03/05/22 1513 07/22/22 0903  NA 139 139 137 141  K 4.1 4.1 4.5 4.9  CL 105 103 103 104  CO2 24 30 26 29   GLUCOSE 89 88 110* 154*  BUN 18 18 20 18   CREATININE 1.28* 1.29* 1.25* 1.15  CALCIUM 9.3 10.0 9.2 9.5  GFRNONAA 59* 59* >60  --       Latest Ref Rng & Units 07/22/2022    9:03 AM 03/05/2022    3:13 PM 03/02/2022    6:54 PM  CMP  Glucose 65 - 99 mg/dL 811  914  88   BUN 7 - 25 mg/dL 18  20  18    Creatinine 0.70 - 1.28 mg/dL 7.82  9.56  2.13   Sodium 135 - 146 mmol/L 141  137  139   Potassium 3.5 - 5.3 mmol/L 4.9  4.5  4.1   Chloride 98 - 110 mmol/L 104  103  103   CO2 20 - 32 mmol/L 29  26  30    Calcium 8.6 - 10.3 mg/dL 9.5  9.2  08.6   Total Protein 6.1 - 8.1 g/dL 6.7  6.9    Total Bilirubin 0.2 - 1.2 mg/dL 0.6  0.9    Alkaline Phos 38 - 126 U/L  56    AST 10 - 35 U/L 25  44    ALT 9 - 46 U/L 38  41        Latest Ref Rng & Units 07/22/2022    9:03 AM 03/05/2022    3:13 PM 03/02/2022    6:54 PM  CBC  WBC 3.8 - 10.8 Thousand/uL  4.8  7.2  7.7   Hemoglobin 13.2 - 17.1 g/dL 34.7  42.5  95.6   Hematocrit 38.5 - 50.0 % 44.5  43.8  45.6   Platelets 140 - 400 Thousand/uL 124  128  125    Lipid Panel Recent Labs    01/21/22 0857  CHOL 150  TRIG 175*  LDLCALC 85  HDL 38*  CHOLHDL 3.9    HEMOGLOBIN A1C Lab Results  Component Value  Date   HGBA1C 9.1 (H) 07/22/2022   MPG 214 07/22/2022   Lab Results  Component Value Date   TSH 2.684 01/15/2021     Radiology   No results found.   Cardiac Studies:   Lower Extremity Arterial Duplex 06/22/2020: No hemodynamically significant stenoses are identified in the right or left lower extremity arterial system. Mildly  abnormal biphasic waveform noted throughout the lower extremity. The right and left mid posterior tibial and mid anterior tibial arteries are non-compressible due to medial arteriosclerosis.   PCV ECHOCARDIOGRAM COMPLETE 01/04/2021 Normal LV systolic function with visual EF 60-65%. Left ventricle cavity is normal in size. Mild left ventricular hypertrophy. Normal global wall motion. Normal diastolic filling pattern, normal LAP. Mild (Grade I) aortic regurgitation. Mild tricuspid regurgitation. No evidence of pulmonary hypertension. Compared to study 06/22/2020 MR is now resolved otherwise no significant change.   Left Heart Catheterization 05/01/21:  LV 135/8, EDP 18 mmHg.  Ao 142/72, mean 104 mmHg.  No pressure gradient across the aortic valve. Left main: Not present LAD: Moderate to large vessel in the proximal segment however it is diffusely diseased.  Proximal 3.0 x 12 mm Promus DES placed 05/03/2014 widely patent.  Mid LAD 2.75 x 38 mm Promus Premier DES, balloon angioplasty of D1 and distal LAD performed 03/29/2013 is widely patent. CX: Large vessel, ostial circumflex 30 to 40% stenosis unchanged from prior catheterization, a 3.5 x 16 mm Promus DES placed 04/08/2014 is widely patent. RCA: Large vessel, mid segment has a diffuse long segment 99% stenosis.  Previously placed 2.74 x 24 mm Promus Premier DES on 12/15/2012 is widely patent.   Interventional data: Successful PTCA and stenting of the mid and distal right coronary artery with 2 overlapping 2.5 x 38 mm and a 2.5 x 18 mm Onyx frontier DES, distal end of the stent overlapped 2.75 x 24 mm Promus Premier DES  placed 12/15/2012.  TIMI II to TIMI-3 flow at the end of the procedure.    04/08/2014: 3.5 x 16 mm Promus DES  05/03/2014:  Proximal 3.0 x 12 mm Promus DES placed 05/03/2014.  Mid LAD 2.75 x 38 mm Promus Premier DES  05/01/2021: 2.5 x 38 mm and a 2.5 x 18 mm Onyx frontier DES, distal end of the stent overlapped to 2.75 x 24 mm Promus Premier DES placed 12/15/2012    PCV CARDIAC STRESS TEST 09/07/2021  Narrative Exercise treadmill stress test 09/07/2021: Exercise treadmill stress test performed using Bruce protocol.  Patient reached 7.7 METS, and 86% of age predicted maximum heart rate. Exercise capacity was fair. Normal heart rate response. Resting BP 130/82 mmHg, with hypertensive response, peak BP 240/80 mmHg.  No chest pain reported.  Dyspnea reported. Stress EKG revealed no ischemic changes. Low risk study.   EKG   EKG 02/08/2022: Normal sinus rhythm at the rate of 71 bpm, left axis deviation, left anterior fascicular block.  Poor R progression, LVH.  Baseline artifact, nonspecific T abnormality.  Compared to 08/06/2021, baseline artifact new.  Otherwise no change.  Allergies & Medications   Allergies  Allergen Reactions   Amlodipine Other (See Comments)    Worsening restless legs, dyspnea and leg swelling   Lyrica [Pregabalin] Other (See Comments)    Made patient very lethargic the next morning, very hard to patient to function, move, etc.    Other Other (See Comments)    Convulsions, Causes Restless Legs, rhabdomyolysis   Statins Other (See Comments)    Causes Restless Legs, rhabdomyolysis   Ativan [Lorazepam] Other (See Comments)    Markedly increased restless legs   Trulicity [Dulaglutide] Other (See Comments)    GI side effects     Current Outpatient Medications:    acetaminophen (TYLENOL) 500 MG tablet, Take 500 mg by mouth every 6 (six) hours as needed for mild pain, moderate pain, headache or fever., Disp: , Rfl:    albuterol (VENTOLIN HFA) 108 (90 Base) MCG/ACT  inhaler, Inhale 2 puffs into the lungs every 6 (six) hours as needed for wheezing or shortness of breath., Disp: 6.7 each, Rfl: 2   aspirin EC 81 MG tablet, Take 1 tablet (81 mg total) by mouth daily. Swallow whole., Disp: 90 tablet, Rfl: 3   benzonatate (TESSALON PERLES) 100 MG capsule, Take 2 capsules (200 mg total) by mouth 3 (three) times daily as needed for cough., Disp: 30 capsule, Rfl: 0   Budeson-Glycopyrrol-Formoterol (BREZTRI AEROSPHERE) 160-9-4.8 MCG/ACT AERO, Inhale 2 puffs into the lungs in the morning and at bedtime., Disp: 5.9 g, Rfl: 0   clopidogrel (PLAVIX) 75 MG tablet, Take 1 tablet (75 mg total) by mouth daily., Disp: 90 tablet, Rfl: 3   Continuous Blood Gluc Receiver (FREESTYLE LIBRE 14 DAY READER) DEVI, Inject 1 Device as directed daily as needed. Use once daily as direct to check blood sugar E11.51, Disp: 1 each, Rfl: 0   Continuous Blood Gluc Sensor (FREESTYLE LIBRE 14 DAY SENSOR) MISC, USE TO TEST BLOOD SUGAR THREE TIMES DAILY. E11.51. E11.59, Disp: 1 each, Rfl: 12   diphenhydramine-acetaminophen (TYLENOL PM) 25-500 MG TABS tablet, Take 1 tablet by mouth at bedtime as needed., Disp: , Rfl:    ferrous sulfate 325 (65 FE) MG EC tablet, Take 1 tablet (325 mg total) by mouth daily., Disp: 30 tablet, Rfl: 11   fluticasone (FLONASE) 50 MCG/ACT nasal spray, Place 1 spray into both nostrils daily as needed for allergies or rhinitis., Disp: 16 g, Rfl: 11   gabapentin (NEURONTIN) 100 MG capsule, Take 300-400 mg by mouth at bedtime., Disp: , Rfl:    glucose 4 GM chewable tablet, Chew 1 tablet (4 g total) by mouth as needed for low blood sugar., Disp: 50 tablet, Rfl: 12   insulin aspart (NOVOLOG FLEXPEN) 100 UNIT/ML FlexPen, INJECT 25-30 UNITS INTO THE SKIN SEE ADMIN INSTRUCTIONS. INJECT 30 UNITS SUBCUTANEOUSLY IN THE MORNING, INJECT 25 UNITS SUBCUTANEOUSLY WITH LUNCH & INJECT 25 UNITS SUBCUTANEOUSLY WITH DINNER., Disp: 30 mL, Rfl: 1   Insulin Glargine (BASAGLAR KWIKPEN) 100 UNIT/ML, Inject  30 Units into the skin at bedtime., Disp: 30 mL, Rfl: 3   Insulin Pen Needle (BD PEN NEEDLE NANO 2ND GEN) 32G X 4 MM MISC, USE AS DIRECTED, Disp: 100 each, Rfl: 3   isosorbide mononitrate (IMDUR) 60 MG 24 hr tablet, TAKE 1 TABLET BY MOUTH EVERY DAY, Disp: 90 tablet, Rfl: 1   levocetirizine (XYZAL) 5 MG tablet, Take 1 tablet (5 mg total) by mouth every evening., Disp: 30 tablet, Rfl: 2   LIFESCAN FINEPOINT LANCETS MISC, Use to test for blood sugar three times  daily dx: E11.51, Disp: 300 each, Rfl: 1   metoprolol succinate (TOPROL-XL) 25 MG 24 hr tablet, TAKE 1 TABLET BY MOUTH EVERY DAY WITH OR IMMEDIATELY FOLLOWING A MEAL, Disp: 90 tablet, Rfl: 1   nitroGLYCERIN (NITROSTAT) 0.4 MG SL tablet, PLACE 1 TABLET UNDER THE TONGUE EVERY 5 MINUTES AS NEEDED FOR CHEST PAIN., Disp: 50 tablet, Rfl: 0   ONETOUCH ULTRA test strip, USE TO TEST FOR BLOOD SUGAR THREE TIMES DAILY DX: E11.51, Disp: 300 strip, Rfl: 11   pantoprazole (PROTONIX) 40 MG tablet, TAKE ONE TABLET BY MOUTH ONCE DAILY FOR STOMACH, Disp: 90 tablet, Rfl: 3   PRALUENT 150 MG/ML SOAJ, INJECT 1 PEN INTO THE SKIN EVERY 14 (FOURTEEN) DAYS., Disp: 6 mL, Rfl: 1   pramipexole (MIRAPEX) 0.5 MG tablet, Take 0.5 mg by mouth as needed., Disp: , Rfl:    Semaglutide,0.25 or 0.5MG /DOS, (OZEMPIC, 0.25 OR 0.5 MG/DOSE,) 2 MG/3ML SOPN, Inject 0.25 mg into the skin once a week., Disp: 3 mL, Rfl: 1   valsartan (DIOVAN) 160 MG tablet, TAKE 1 TABLET (160 MG TOTAL) BY MOUTH EVERY EVENING., Disp: 90 tablet, Rfl: 3 No current facility-administered medications for this visit.  Facility-Administered Medications Ordered in Other Visits:    [COMPLETED] hydrALAZINE (APRESOLINE) injection 10 mg, 10 mg, Intravenous, Once, [name], MD, 10 mg at 05/01/21 1054   Assessment     ICD-10-CM   1. Coronary artery disease of native artery of native heart with stable angina pectoris (HCC)  I25.118     2. Essential hypertension  I10     3. Pure hypercholesterolemia  E78.00      4. Diabetes mellitus type 2, insulin dependent (HCC)  E11.9    Z79.4      No orders of the defined types were placed in this encounter.    There are no discontinued medications.    Recommendations:   Kenneth Gazda Sr.  is a 72 y.o. Caucasian male with coronary artery disease, with stents to his LAD, RCA and circumflex, remote balloon angioplasty to distal LAD and diagonal has remained patent,  hypertension, hyperlipidemia, mild chronic thrombocytopenia, GERD, Barrett's esophagus, statin intolerance, uncontrolled diabetes mellitus, severe restless leg syndrome.  He also had florid COVID 19 infection in February 2021.  1. Coronary artery disease of native artery of native heart with stable angina pectoris [facility]) Patient has not had any further episodes of chest pain.  Continue present medical management.  2.  Diabetes mellitus type 2, insulin dependent (HCC) He was prescribed Ozempic, previously had not picked it up however on his last office visit 6 weeks ago I started him on 0.25 mg today and clear-cut instructions given.  I have discussed with him to reduce his intake of food to avoid nausea and also to have small meals.  He has lost about 5 pounds in weight.  He is restless leg has got extremely severe now.  He wishes to continue to using Ozempic and does not attribute any side effect from Ozempic to restless leg which are 2 different entities.  I discussed with him that improved diabetes control will help with improving potential of peripheral neuropathy and neuronal damage due to hyperglycemia.  Will increase the dose from 0.5 mg to 1 mg and I will request Abbey Chatters to see whether she can continue to manage the diabetes along with weight loss going further, I will see him back in 6 months for follow-up.  He has reactive hypertension, due to severe restless leg he has not  slept all night last night.  Blood pressure recordings last several times have been under excellent control.  Hence I  did not make any changes to his medications.   [name], MD, [facility] 09/24/2022, 7:40 AM Office: [phone] Fax: [phone] Pager: [phone]

## 2022-10-13 ENCOUNTER — Other Ambulatory Visit: Payer: Self-pay | Admitting: Nurse Practitioner

## 2022-10-16 ENCOUNTER — Other Ambulatory Visit: Payer: Self-pay | Admitting: Nurse Practitioner

## 2022-10-16 DIAGNOSIS — E1165 Type 2 diabetes mellitus with hyperglycemia: Secondary | ICD-10-CM

## 2022-10-16 NOTE — Telephone Encounter (Signed)
Patient medication has allergy contraindications. Medication pend and sent to PCP Janyth Contes Janene Harvey, NP for approval.

## 2022-10-18 ENCOUNTER — Other Ambulatory Visit: Payer: Self-pay | Admitting: Neurology

## 2022-10-18 DIAGNOSIS — G2581 Restless legs syndrome: Secondary | ICD-10-CM

## 2022-10-25 DIAGNOSIS — M79651 Pain in right thigh: Secondary | ICD-10-CM | POA: Diagnosis not present

## 2022-10-28 ENCOUNTER — Encounter: Payer: Self-pay | Admitting: Nurse Practitioner

## 2022-10-28 ENCOUNTER — Ambulatory Visit: Payer: Medicare Other | Admitting: Nurse Practitioner

## 2022-10-28 VITALS — BP 138/88 | HR 64 | Temp 98.0°F | Ht 68.0 in | Wt 205.6 lb

## 2022-10-28 DIAGNOSIS — M25561 Pain in right knee: Secondary | ICD-10-CM | POA: Diagnosis not present

## 2022-10-28 DIAGNOSIS — Z0000 Encounter for general adult medical examination without abnormal findings: Secondary | ICD-10-CM | POA: Diagnosis not present

## 2022-10-28 DIAGNOSIS — M25551 Pain in right hip: Secondary | ICD-10-CM | POA: Diagnosis not present

## 2022-10-28 DIAGNOSIS — H9190 Unspecified hearing loss, unspecified ear: Secondary | ICD-10-CM

## 2022-10-28 NOTE — Patient Instructions (Signed)
  [name] , Thank you for taking time to come for your Medicare Wellness Visit. I appreciate your ongoing commitment to your health goals. Please review the following plan we discussed and let me know if I can assist you in the future.   These are the goals we discussed:  Goals     A1c less than 7              This is a list of the screening recommended for you and due dates:  Health Maintenance  Topic Date Due   Eye exam for diabetics  10/05/2022   Complete foot exam   10/23/2022   Hemoglobin A1C  01/22/2023   Yearly kidney function blood test for diabetes  07/22/2023   Yearly kidney health urinalysis for diabetes  07/22/2023   Medicare Annual Wellness Visit  10/28/2023   Colon Cancer Screening  10/18/2025   DTaP/Tdap/Td vaccine (5 - Td or Tdap) 03/02/2027   Hepatitis C Screening  Completed   HPV Vaccine  Aged Out   Pneumonia Vaccine  Discontinued   Flu Shot  Discontinued   COVID-19 Vaccine  Discontinued   Zoster (Shingles) Vaccine  Discontinued   Call and make appt with eye doctor for diabetic eye exam.

## 2022-10-28 NOTE — Progress Notes (Signed)
Subjective:   Kenneth Osada Sr. is a 72 y.o. male who presents for Medicare Annual/Subsequent preventive examination.  Visit Complete: In person at [facility]   Review of Systems     Cardiac Risk Factors include: advanced age (>39men, >55 women);male gender;dyslipidemia;hypertension;diabetes mellitus;obesity (BMI >30kg/m2)     Objective:    Today's Vitals   10/28/22 0911 10/28/22 0924  BP: 138/88   Pulse: 64   Temp: 98 F (36.7 C)   SpO2: 97%   Weight: 205 lb 9.6 oz (93.3 kg)   Height: 5\' 8"  (1.727 m)   PainSc:  7    Body mass index is 31.26 kg/m.     10/28/2022    9:09 AM 09/12/2022    9:06 AM 09/09/2022    9:56 AM 07/24/2022    8:48 AM 07/22/2022    8:26 AM 05/20/2022    2:23 PM 04/10/2022    8:39 AM  Advanced Directives  Does Patient Have a Medical Advance Directive? No No No No No No No  Would patient like information on creating a medical advance directive? No - Patient declined No - Patient declined No - Patient declined No - Patient declined Yes (MAU/Ambulatory/Procedural Areas - Information given) No - Patient declined     Current Medications (verified) Outpatient Encounter Medications as of 10/28/2022  Medication Sig   acetaminophen (TYLENOL) 500 MG tablet Take 500 mg by mouth every 6 (six) hours as needed for mild pain, moderate pain, headache or fever.   albuterol (VENTOLIN HFA) 108 (90 Base) MCG/ACT inhaler Inhale 2 puffs into the lungs every 6 (six) hours as needed for wheezing or shortness of breath.   aspirin EC 81 MG tablet Take 1 tablet (81 mg total) by mouth daily. Swallow whole.   benzonatate (TESSALON PERLES) 100 MG capsule Take 2 capsules (200 mg total) by mouth 3 (three) times daily as needed for cough.   Budeson-Glycopyrrol-Formoterol (BREZTRI AEROSPHERE) 160-9-4.8 MCG/ACT AERO Inhale 2 puffs into the lungs in the morning and at bedtime.   clopidogrel (PLAVIX) 75 MG tablet Take 1 tablet (75 mg total) by mouth daily.   Continuous Blood Gluc Receiver (FREESTYLE  LIBRE 14 DAY READER) DEVI Inject 1 Device as directed daily as needed. Use once daily as direct to check blood sugar E11.51   Continuous Blood Gluc Sensor (FREESTYLE LIBRE 14 DAY SENSOR) MISC USE TO TEST BLOOD SUGAR THREE TIMES DAILY. E11.51. E11.59   diphenhydramine-acetaminophen (TYLENOL PM) 25-500 MG TABS tablet Take 1 tablet by mouth at bedtime as needed.   fluticasone (FLONASE) 50 MCG/ACT nasal spray Place 1 spray into both nostrils daily as needed for allergies or rhinitis.   gabapentin (NEURONTIN) 100 MG capsule TAKE 3 CAPSULES (300 MG TOTAL) BY MOUTH 2 (TWO) TIMES DAILY.   glucose 4 GM chewable tablet Chew 1 tablet (4 g total) by mouth as needed for low blood sugar.   insulin aspart (NOVOLOG FLEXPEN) 100 UNIT/ML FlexPen INJECT 25-30 UNITS INTO THE SKIN SEE ADMIN INSTRUCTIONS. INJECT 30 UNITS SUBCUTANEOUSLY IN THE MORNING, INJECT 25 UNITS SUBCUTANEOUSLY WITH LUNCH & INJECT 25 UNITS SUBCUTANEOUSLY WITH DINNER.   Insulin Glargine (BASAGLAR KWIKPEN) 100 UNIT/ML Inject 30 Units into the skin at bedtime.   Insulin Pen Needle (BD PEN NEEDLE NANO 2ND GEN) 32G X 4 MM MISC USE AS DIRECTED   LIFESCAN FINEPOINT LANCETS MISC Use to test for blood sugar three times daily dx: E11.51   metoprolol succinate (TOPROL-XL) 25 MG 24 hr tablet TAKE 1 TABLET BY MOUTH EVERY DAY  WITH OR IMMEDIATELY FOLLOWING A MEAL   nitroGLYCERIN (NITROSTAT) 0.4 MG SL tablet PLACE 1 TABLET UNDER THE TONGUE EVERY 5 MINUTES AS NEEDED FOR CHEST PAIN.   ONETOUCH ULTRA test strip USE TO TEST FOR BLOOD SUGAR THREE TIMES DAILY DX: E11.51   pantoprazole (PROTONIX) 40 MG tablet TAKE ONE TABLET BY MOUTH ONCE DAILY FOR STOMACH   PRALUENT 150 MG/ML SOAJ INJECT 1 PEN INTO THE SKIN EVERY 14 (FOURTEEN) DAYS.   pramipexole (MIRAPEX) 0.5 MG tablet Take 0.5 mg by mouth as needed.   valsartan (DIOVAN) 160 MG tablet TAKE 1 TABLET (160 MG TOTAL) BY MOUTH EVERY EVENING.   [DISCONTINUED] ferrous sulfate 325 (65 FE) MG EC tablet Take 1 tablet (325 mg  total) by mouth daily.   [DISCONTINUED] isosorbide mononitrate (IMDUR) 60 MG 24 hr tablet TAKE 1 TABLET BY MOUTH EVERY DAY   [DISCONTINUED] levocetirizine (XYZAL) 5 MG tablet Take 1 tablet (5 mg total) by mouth every evening.   [DISCONTINUED] OZEMPIC, 0.25 OR 0.5 MG/DOSE, 2 MG/3ML SOPN INJECT 0.25MG  INTO THE SKIN ONE TIME PER WEEK   Facility-Administered Encounter Medications as of 10/28/2022  Medication   [COMPLETED] hydrALAZINE (APRESOLINE) injection 10 mg    Allergies (verified) Amlodipine, Lyrica [pregabalin], Other, Statins, Ativan [lorazepam], and Trulicity [dulaglutide]   History: Past Medical History:  Diagnosis Date   Arthritis    "left thumb" (05/23/2017) right shoulder   Barrett's esophagus    "we've been told that it's all gone; still take RX for GERD" (05/23/2017)   Bilateral swelling of feet and ankles x 3 weeks as of 12-01-2019   CAD (coronary artery disease), native coronary artery    Coronary angiogram 05/03/2014:  Proximal LAD 3.0x12 mm Promus premier stent.  04/08/2014: Mid Cx 3.5 x 16 mm Promus DES.  03/29/2013: Mid LAD 2.75 x 38 mm Promus Premier drug-eluting stent, balloon angioplasty of D1 and distal LAD and stenting of distal RCA with 2.75 x 24 mm promos Premier drug-eluting stent 12/15/2012 widely patent.   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  04/08/2014: Mid Cx 3.5 x 16 mm Promus DES.  03/29/2013: Mid LAD 2.75 x 38 mm Promus Premier drug-eluting stent, balloon angioplasty of D1 and distal  LAD and stenting of distal RCA with 2.75 x 24 mm promos Premier drug-eluting stent 12/15/2012 widely patent.   Past Surgical History:  Procedure Laterality Date   BICEPT TENODESIS Right 12/07/2019   Procedure: RIGHT BICEPS TENODESIS;  Surgeon: Sheral Apley, MD;  Location: [facility];  Service: Orthopedics;  Laterality: Right;   CARDIAC CATHETERIZATION N/A 09/25/2015   Procedure: Left Heart Cath and Coronary Angiography;  Surgeon: [name], MD;  Location: [facility] INVASIVE CV LAB;  Service: Cardiovascular;  Laterality: N/A;   CARDIAC CATHETERIZATION N/A 09/25/2015   Procedure: Intravascular Pressure Wire/FFR Study;  Surgeon: [name], MD;  Location: [facility] INVASIVE CV LAB;  Service: Cardiovascular;  Laterality: N/A;   CORONARY ANGIOPLASTY WITH STENT PLACEMENT  12/15/2012; 04/28/2014; 05/03/2014   "2; 1; 1" , 4 stents 2 balloons   CORONARY STENT INTERVENTION N/A 05/01/2021   Procedure: CORONARY STENT INTERVENTION;  Surgeon: [name], MD;  Location: [facility] INVASIVE CV LAB;  Service: Cardiovascular;  Laterality: N/A;   FRACTIONAL FLOW RESERVE WIRE Right 03/26/2013   Procedure: FRACTIONAL FLOW RESERVE WIRE;  Surgeon: Pamella Pert, MD;  Location: [facility] CATH LAB;  Service: Cardiovascular;  Laterality: Right;   FRACTIONAL FLOW RESERVE WIRE N/A 05/03/2014   Procedure: FRACTIONAL FLOW RESERVE WIRE;  Surgeon: Pamella Pert, MD;   Location[facility] C[facility] LAB;  Service: Cardiovascular;  Laterality: N/A;   HERNIA REPAIR  2010   "umbilical"    INCISION AND DRAINAGE ABSCESS Left 05/2007   "groin"    KNEE SURGERY Right 1992;  2000   "calcium deposits removed" (12/15/2012)   LEFT HEART CATH AND CORONARY ANGIOGRAPHY N/A 05/23/2017   Procedure: LEFT HEART CATH AND CORONARY ANGIOGRAPHY;  Surgeon: [name], MD;  Location: [facility] INVASIVE CV LAB;  Service: Cardiovascular;  Laterality: N/A;   LEFT HEART CATH AND CORONARY ANGIOGRAPHY N/A 05/01/2021   Procedure: LEFT HEART CATH AND CORONARY ANGIOGRAPHY;  Surgeon: [name], MD;  Location: [facility] INVASIVE CV LAB;  Service: Cardiovascular;  Laterality: N/A;   LEFT HEART CATHETERIZATION WITH CORONARY ANGIOGRAM N/A 12/15/2012   Procedure: LEFT HEART CATHETERIZATION WITH CORONARY ANGIOGRAM;  Surgeon: Pamella Pert, MD;  Location: [facility] CATH LAB;  Service: Cardiovascular;  Laterality: N/A;   LEFT HEART CATHETERIZATION WITH CORONARY ANGIOGRAM N/A 03/26/2013   Procedure: LEFT HEART CATHETERIZATION WITH CORONARY ANGIOGRAM;  Surgeon: Pamella Pert, MD;  Location: [facility] CATH LAB;  Service: Cardiovascular;  Laterality: N/A;   LEFT HEART CATHETERIZATION WITH CORONARY ANGIOGRAM N/A 04/08/2014   Procedure: LEFT HEART CATHETERIZATION WITH CORONARY ANGIOGRAM;  Surgeon: Micheline Chapman, MD;  Location: [facility] CATH LAB;  Service: Cardiovascular;  Laterality: N/A;   MOUTH SURGERY Right    PERCUTANEOUS CORONARY STENT INTERVENTION (PCI-S) N/A 05/03/2014   Procedure: PERCUTANEOUS CORONARY STENT INTERVENTION (PCI-S);  Surgeon: Pamella Pert, MD;  Location: [facility] CATH LAB;  Service: Cardiovascular;  Laterality: N/A;   RADIOLOGY WITH ANESTHESIA N/A 02/08/2021   Procedure: MRI WITH ANESTHESIA OF ABDOMEN WITH AND WITHOUT CONSTRAST;  Surgeon: Radiologist, Medication, MD;  Location: [facility] OR;  Service: Radiology;  Laterality: N/A;   ROTATOR CUFF REPAIR  2022   UMBILICAL HERNIA REPAIR  yrs ago   Family History  Adopted: Yes   Problem Relation Age of Onset   Emphysema Mother    Social History   Socioeconomic History   Marital status: Married    Spouse name: Pam   Number of children: 2   Years of education: 12   Highest education level: High school graduate  Occupational  History   Occupation: Naval architect    Comment: deliveries only now   Occupation: woodworking  Tobacco Use   Smoking status: Never   Smokeless tobacco: Never  Vaping Use   Vaping status: Never Used  Substance and Sexual Activity   Alcohol use: No   Drug use: No   Sexual activity: Not Currently  Other Topics Concern   Not on file  Social History Narrative   Right handed    Caffeine 2 cups daily   Winter time 4 cups daily   Lives family one level home    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   Financial Resource Strain: Low Risk  (01/18/2021)   Overall Financial Resource Strain (CARDIA)    Difficulty of Paying Living Expenses: Not very hard  Food Insecurity: No Food Insecurity (01/18/2021)   Hunger Vital Sign    Worried About Running Out of Food in the Last Year: Never true    Ran Out of Food in the Last Year: Never true  Transportation Needs: No Transportation Needs (01/18/2021)   PRAPARE - Administrator, Civil Service (Medical): No    Lack of Transportation (Non-Medical): No  Physical Activity: Inactive (04/25/2017)   Exercise Vital Sign    Days of Exercise per Week: 0 days    Minutes of Exercise per Session: 0 min  Stress: No Stress Concern Present (04/25/2017)   [employer] of Occupational Health - Occupational Stress Questionnaire    Feeling of Stress : Only a little  Social Connections: Moderately Integrated (04/25/2017)   Social Connection and Isolation Panel [NHANES]    Frequency of Communication with Friends and Family: More than three times a week    Frequency of Social Gatherings with Friends and Family: More than three times a week    Attends Religious Services: More than 4 times per  year    Active Member of [employer] or Organizations: No    Attends Engineer, structural: Never    Marital Status: Married    Tobacco Counseling Counseling given: Not Answered   Clinical Intake:  Pre-visit preparation completed: Yes  Pain : 0-10 Pain Score: 7  Pain Type: Acute pain Pain Location: Hip Pain Orientation: Right Pain Onset: 1 to 4 weeks ago Pain Frequency: Constant     BMI - recorded: 31.26 Nutritional Status: BMI > 30  Obese Nutritional Risks: None Diabetes: Yes  How often do you need to have someone help you when you read instructions, pamphlets, or other written materials from your doctor or pharmacy?: 2 - Rarely         Activities of Daily Living    10/28/2022    9:21 AM  In your present state of health, do you have any difficulty performing the following activities:  Hearing? 1  Vision? 0  Difficulty concentrating or making decisions? 0  Walking or climbing stairs? 1  Dressing or bathing? 0  Doing errands, shopping? 0  Preparing Food and eating ? N  Using the Toilet? N  In the past six months, have you accidently leaked urine? Y  Do you have problems with loss of bowel control? N  Managing your Medications? N  Managing your Finances? N  Housekeeping or managing your Housekeeping? N    Patient Care Team: [name], NP as PCP - General (Geriatric Medicine) [name], MD as Consulting Physician (Cardiology) Van Clines, MD as Consulting Physician (Neurology) [name], [name], OD (Optometry) [name], MD as Consulting Physician (  Otolaryngology)  Indicate any recent Medical Services you may have received from other than [facility] providers in the past year (date may be approximate).     Assessment:   This is a routine wellness examination for Daiel.  Hearing/Vision screen Vision Screening - Comments:: Dr. [name] Last Appointment 2023  Dietary issues and exercise activities discussed:     Goals Addressed   None     Depression Screen    10/28/2022    9:08 AM 09/12/2022    9:06 AM 07/22/2022    8:26 AM 10/11/2021    8:13 AM 09/28/2021    3:12 PM 07/12/2021    3:20 PM 01/18/2021   10:00 AM  PHQ 2/9 Scores  PHQ - 2 Score 0 0 0 0 0 0 0  PHQ- 9 Score     2      Fall Risk    10/28/2022    9:08 AM 09/12/2022    9:06 AM 07/24/2022    8:49 AM 07/22/2022    8:26 AM 05/20/2022    2:23 PM  Fall Risk   Falls in the past year? 1 0 1 1 1   Number falls in past yr: 0 0 1 1 0  Injury with Fall? 0 0 0 0 0  Risk for fall due to :  No Fall Risks  History of fall(s) Impaired balance/gait;Impaired mobility  Follow up  Falls evaluation completed Falls evaluation completed Falls evaluation completed Falls evaluation completed;Education provided;Falls prevention discussed    MEDICARE RISK AT HOME:    TIMED UP AND GO:  Was the test performed?  No    Cognitive Function:    04/25/2017    9:17 AM 01/10/2016    9:05 AM  MMSE - Mini Mental State Exam  Orientation to time 5 5  Orientation to Place 5 5  Registration 3 3  Attention/ Calculation 2 5  Recall 2 3  Language- name 2 objects 2 2  Language- repeat 1 1  Language- follow 3 step command 3 3  Language- read & follow direction 1 1  Write a sentence 1 1  Copy design 1 1  Total score 26 30        10/28/2022    9:10 AM 10/11/2021    8:46 AM 03/02/2020    9:32 AM 03/02/2019    9:52 AM  6CIT Screen  What Year? 0 points 0 points 0 points 0 points  What month? 0 points 0 points 0 points 0 points  What time? 0 points 0 points 0 points 0 points  Count back from 20 0 points 0 points 0 points 0 points  Months in reverse 0 points 0 points 2 points 2 points  Repeat phrase 2 points 4 points 4 points 0 points  Total Score 2 points 4 points 6 points 2 points    Immunizations Immunization History  Administered Date(s) Administered   Pneumococcal Conjugate-13 12/01/2013   Td 03/04/1998, 03/01/2017   Tdap 03/06/2011, 02/28/2017    TDAP status: Up to  date  Flu Vaccine status: Declined, Education has been provided regarding the importance of this vaccine but patient still declined. Advised may receive this vaccine at local pharmacy or Health Dept. Aware to provide a copy of the vaccination record if obtained from local pharmacy or Health Dept. Verbalized acceptance and understanding.  Pneumococcal vaccine status: Declined,  Education has been provided regarding the importance of this vaccine but patient still declined. Advised may receive this vaccine at local pharmacy or Health  Dept. Aware to provide a copy of the vaccination record if obtained from local pharmacy or Health Dept. Verbalized acceptance and understanding.   Covid-19 vaccine status: Information provided on how to obtain vaccines.   Qualifies for Shingles Vaccine? Yes   Zostavax completed No   Shingrix Completed?: No.    Education has been provided regarding the importance of this vaccine. Patient has been advised to call insurance company to determine out of pocket expense if they have not yet received this vaccine. Advised may also receive vaccine at local pharmacy or Health Dept. Verbalized acceptance and understanding.  Screening Tests Health Maintenance  Topic Date Due   OPHTHALMOLOGY EXAM  10/05/2022   FOOT EXAM  10/23/2022   HEMOGLOBIN A1C  01/22/2023   Diabetic kidney evaluation - eGFR measurement  07/22/2023   Diabetic kidney evaluation - Urine ACR  07/22/2023   Medicare Annual Wellness (AWV)  10/28/2023   Colonoscopy  10/18/2025   DTaP/Tdap/Td (5 - Td or Tdap) 03/02/2027   Hepatitis C Screening  Completed   HPV VACCINES  Aged Out   Pneumonia Vaccine 75+ Years old  Discontinued   INFLUENZA VACCINE  Discontinued   COVID-19 Vaccine  Discontinued   Zoster Vaccines- Shingrix  Discontinued    Health Maintenance  Health Maintenance Due  Topic Date Due   OPHTHALMOLOGY EXAM  10/05/2022   FOOT EXAM  10/23/2022    Colorectal cancer screening: Type of screening:  Colonoscopy. Completed 2017. Repeat every 10 years  Lung Cancer Screening: (Low Dose CT Chest recommended if Age 37-80 years, 20 pack-year currently smoking OR have quit w/in 15years.) does not qualify.   Lung Cancer Screening Referral: na  Additional Screening:  Hepatitis C Screening: does qualify; Completed   Vision Screening: Recommended annual ophthalmology exams for early detection of glaucoma and other disorders of the eye. Is the patient up to date with their annual eye exam?  No  Who is the provider or what is the name of the office in which the patient attends annual eye exams? [name] If pt is not established with a provider, would they like to be referred to a provider to establish care? No .   Dental Screening: Recommended annual dental exams for proper oral hygiene  Diabetic Foot Exam: Diabetic Foot Exam: Completed today  Community Resource Referral / Chronic Care Management: CRR required this visit?  No   CCM required this visit?  No     Plan:     I have personally reviewed and noted the following in the patient's chart:   Medical and social history Use of alcohol, tobacco or illicit drugs  Current medications and supplements including opioid prescriptions. Patient is not currently taking opioid prescriptions. Functional ability and status Nutritional status Physical activity Advanced directives List of other physicians Hospitalizations, surgeries, and ER visits in previous 12 months Vitals Screenings to include cognitive, depression, and falls Referrals and appointments  In addition, I have reviewed and discussed with patient certain preventive protocols, quality metrics, and best practice recommendations. A written personalized care plan for preventive services as well as general preventive health recommendations were provided to patient.     [name], NP   10/28/2022

## 2022-10-30 ENCOUNTER — Other Ambulatory Visit: Payer: Self-pay | Admitting: Internal Medicine

## 2022-11-05 DIAGNOSIS — I788 Other diseases of capillaries: Secondary | ICD-10-CM | POA: Diagnosis not present

## 2022-11-05 DIAGNOSIS — C44311 Basal cell carcinoma of skin of nose: Secondary | ICD-10-CM | POA: Diagnosis not present

## 2022-11-05 DIAGNOSIS — D492 Neoplasm of unspecified behavior of bone, soft tissue, and skin: Secondary | ICD-10-CM | POA: Diagnosis not present

## 2022-11-06 ENCOUNTER — Ambulatory Visit: Payer: Medicare Other | Admitting: Cardiology

## 2022-11-06 ENCOUNTER — Encounter: Payer: Self-pay | Admitting: Cardiology

## 2022-11-06 VITALS — BP 139/80 | HR 67 | Resp 16 | Ht 68.0 in | Wt 202.0 lb

## 2022-11-06 DIAGNOSIS — E7800 Pure hypercholesterolemia, unspecified: Secondary | ICD-10-CM

## 2022-11-06 DIAGNOSIS — G720 Drug-induced myopathy: Secondary | ICD-10-CM | POA: Diagnosis not present

## 2022-11-06 DIAGNOSIS — I25118 Atherosclerotic heart disease of native coronary artery with other forms of angina pectoris: Secondary | ICD-10-CM | POA: Diagnosis not present

## 2022-11-06 DIAGNOSIS — I10 Essential (primary) hypertension: Secondary | ICD-10-CM

## 2022-11-06 DIAGNOSIS — T466X5A Adverse effect of antihyperlipidemic and antiarteriosclerotic drugs, initial encounter: Secondary | ICD-10-CM | POA: Diagnosis not present

## 2022-11-06 MED ORDER — HYDROCHLOROTHIAZIDE 25 MG PO TABS
25.0000 mg | ORAL_TABLET | Freq: Every day | ORAL | 0 refills | Status: DC
Start: 2022-11-06 — End: 2022-12-30

## 2022-11-06 NOTE — Progress Notes (Signed)
Primary Physician/Referring:  [name], NP  Patient ID: Kenneth Sic Sr., male    DOB: 02-11-1951, 72 y.o.   MRN: [id number]  Chief Complaint  Patient presents with   Coronary Artery Disease   Hypertension   Hyperlipidemia   Follow-up    6 month   HPI:    Kenneth Laspisa Sr.  is a 72 y.o. Caucasian male with coronary artery disease, with stents to his LAD, RCA and circumflex, remote balloon angioplasty to distal LAD and diagonal has remained patent,  hypertension, hyperlipidemia, GERD, Barrett's esophagus, statin intolerance, uncontrolled diabetes mellitus, severe restless leg syndrome.  He also had florid COVID 19 infection in February 2021.  Patient presents for 73-month follow-up visit.  He has not had any further episodes of chest pain and he has not used any sublingual nitroglycerin.  States that his restless leg has gotten severe to the point where he has been in the emergency room due to severe restless leg symptoms.  Past Medical History:  Diagnosis Date   Arthritis    "left thumb" (05/23/2017) right shoulder   Barrett's esophagus    "we've been told that it's all gone; still take RX for GERD" (05/23/2017)   Bilateral swelling of feet and ankles x 3 weeks as of 12-01-2019   CAD (coronary artery disease), native coronary artery    Coronary angiogram 05/03/2014:  Proximal LAD 3.0x12 mm Promus premier stent.  04/08/2014: Mid Cx 3.5 x 16 mm Promus DES.  03/29/2013: Mid LAD 2.75 x 38 mm Promus Premier drug-eluting stent, balloon angioplasty of D1 and distal LAD and stenting of distal RCA with 2.75 x 24 mm promos Premier drug-eluting stent 12/15/2012 widely patent.   Cervicalgia    Chronic bronchitis (HCC)    "q yr" (05/23/2017)   Complication of anesthesia    " I do not wake up very well "  slow to wake up   COVID-19 05/2019   all covid symptoms in hospital x 5 days, all symptoms resolved took monoclonal antibody tx    Dyspnea    with exertion   Family history of adverse reaction to  anesthesia    "son w/PONV"   GERD (gastroesophageal reflux disease)    Heart murmur    "grew out of it"    Hyperlipidemia    Hypertension    Hypoglycemia, unspecified    IDDM (insulin dependent diabetes mellitus)    type 2   Impotence of organic origin    Iron deficiency anemia    Memory loss    mild   Other malaise and fatigue    Pneumonia 05/2019   PONV (postoperative nausea and vomiting)    after wisdom teeth pulled, no problems with other surgeries   Restless leg    Rotator cuff arthropathy    Stroke (HCC)    Tension headache    "sometimes" (05/23/2017)   Unspecified gastritis and gastroduodenitis with hemorrhage    Unstable angina pectoris (HCC) 04/08/2014   Coronary angiogram 05/03/2014:  Proximal LAD 3.0x12 mm Promus premier stent.  04/08/2014: Mid Cx 3.5 x 16 mm Promus DES.  03/29/2013: Mid LAD 2.75 x 38 mm Promus Premier drug-eluting stent, balloon angioplasty of D1 and distal LAD and stenting of distal RCA with 2.75 x 24 mm promos Premier drug-eluting stent 12/15/2012 widely patent.   ROS  Review of Systems  Cardiovascular:  Positive for leg swelling (Occasional). Negative for chest pain (resolved), claudication, dyspnea on exertion and palpitations.  Neurological:  Negative for dizziness.  Objective      11/06/2022    8:32 AM 10/28/2022    9:11 AM 09/12/2022    9:05 AM  Vitals with BMI  Height 5\' 8"  5\' 8"  5\' 8"   Weight 202 lbs 205 lbs 10 oz 204 lbs 3 oz  BMI 30.72 31.27 31.06  Systolic 139 138 161  Diastolic 80 88 76  Pulse 67 64 66    Blood pressure 139/80, pulse 67, resp. rate 16, height 5\' 8"  (1.727 m), weight 202 lb (91.6 kg), SpO2 96%. Body mass index is 30.71 kg/m.   Physical Exam Vitals reviewed.  Constitutional:      Appearance: He is well-developed.  Neck:     Vascular: No carotid bruit or JVD.  Cardiovascular:     Rate and Rhythm: Normal rate and regular rhythm.     Pulses:          Carotid pulses are 2+ on the right side and 2+ on the left  side.      Femoral pulses are 2+ on the right side and 2+ on the left side.      Dorsalis pedis pulses are 0 on the right side and 0 on the left side.       Posterior tibial pulses are 1+ on the right side and 0 on the left side.     Heart sounds: Murmur heard.     Blowing decrescendo early diastolic murmur is present with a grade of 2/4 at the upper right sternal border radiating to the apex.     No gallop.  Pulmonary:     Effort: Pulmonary effort is normal.     Breath sounds: Normal breath sounds.  Abdominal:     General: Bowel sounds are normal.     Palpations: Abdomen is soft.  Musculoskeletal:     Right lower leg: Edema (1+) present.     Left lower leg: Edema (1+) present.    Laboratory examination:   Recent Labs    01/01/22 0513 03/02/22 1854 03/05/22 1513 07/22/22 0903  NA 139 139 137 141  K 4.1 4.1 4.5 4.9  CL 105 103 103 104  CO2 24 30 26 29   GLUCOSE 89 88 110* 154*  BUN 18 18 20 18   CREATININE 1.28* 1.29* 1.25* 1.15  CALCIUM 9.3 10.0 9.2 9.5  GFRNONAA 59* 59* >60  --       Latest Ref Rng & Units 07/22/2022    9:03 AM 03/05/2022    3:13 PM 03/02/2022    6:54 PM  CMP  Glucose 65 - 99 mg/dL 096  045  88   BUN 7 - 25 mg/dL 18  20  18    Creatinine 0.70 - 1.28 mg/dL 4.09  8.11  9.14   Sodium 135 - 146 mmol/L 141  137  139   Potassium 3.5 - 5.3 mmol/L 4.9  4.5  4.1   Chloride 98 - 110 mmol/L 104  103  103   CO2 20 - 32 mmol/L 29  26  30    Calcium 8.6 - 10.3 mg/dL 9.5  9.2  78.2   Total Protein 6.1 - 8.1 g/dL 6.7  6.9    Total Bilirubin 0.2 - 1.2 mg/dL 0.6  0.9    Alkaline Phos 38 - 126 U/L  56    AST 10 - 35 U/L 25  44    ALT 9 - 46 U/L 38  41        Latest Ref Rng & Units 07/22/2022  9:03 AM 03/05/2022    3:13 PM 03/02/2022    6:54 PM  CBC  WBC 3.8 - 10.8 Thousand/uL 4.8  7.2  7.7   Hemoglobin 13.2 - 17.1 g/dL 16.1  09.6  04.5   Hematocrit 38.5 - 50.0 % 44.5  43.8  45.6   Platelets 140 - 400 Thousand/uL 124  128  125    Lipid Panel Recent Labs     01/21/22 0857  CHOL 150  TRIG 175*  LDLCALC 85  HDL 38*  CHOLHDL 3.9    HEMOGLOBIN A1C Lab Results  Component Value Date   HGBA1C 9.1 (H) 07/22/2022   MPG 214 07/22/2022   Lab Results  Component Value Date   TSH 2.684 01/15/2021     BNP (last 3 results) No results for input(s): "BNP" in the last 8760 hours.   Allergies   Allergies  Allergen Reactions   Amlodipine Other (See Comments)    Worsening restless legs, dyspnea and leg swelling   Lyrica [Pregabalin] Other (See Comments)    Made patient very lethargic the next morning, very hard to patient to function, move, etc.    Other Other (See Comments)    Convulsions, Causes Restless Legs, rhabdomyolysis   Statins Other (See Comments)    Causes Restless Legs, rhabdomyolysis   Ativan [Lorazepam] Other (See Comments)    Markedly increased restless legs   Trulicity [Dulaglutide] Other (See Comments)    GI side effects      Final Medications at End of Visit    Current Outpatient Medications:    acetaminophen (TYLENOL) 500 MG tablet, Take 500 mg by mouth every 6 (six) hours as needed for mild pain, moderate pain, headache or fever., Disp: , Rfl:    albuterol (VENTOLIN HFA) 108 (90 Base) MCG/ACT inhaler, Inhale 2 puffs into the lungs every 6 (six) hours as needed for wheezing or shortness of breath., Disp: 6.7 each, Rfl: 2   aspirin EC 81 MG tablet, Take 1 tablet (81 mg total) by mouth daily. Swallow whole., Disp: 90 tablet, Rfl: 3   benzonatate (TESSALON PERLES) 100 MG capsule, Take 2 capsules (200 mg total) by mouth 3 (three) times daily as needed for cough., Disp: 30 capsule, Rfl: 0   clopidogrel (PLAVIX) 75 MG tablet, Take 1 tablet (75 mg total) by mouth daily., Disp: 90 tablet, Rfl: 3   Continuous Blood Gluc Receiver (FREESTYLE LIBRE 14 DAY READER) DEVI, Inject 1 Device as directed daily as needed. Use once daily as direct to check blood sugar E11.51, Disp: 1 each, Rfl: 0   Continuous Blood Gluc Sensor (FREESTYLE LIBRE 14  DAY SENSOR) MISC, USE TO TEST BLOOD SUGAR THREE TIMES DAILY. E11.51. E11.59, Disp: 1 each, Rfl: 12   diphenhydramine-acetaminophen (TYLENOL PM) 25-500 MG TABS tablet, Take 1 tablet by mouth at bedtime as needed., Disp: , Rfl:    fluticasone (FLONASE) 50 MCG/ACT nasal spray, Place 1 spray into both nostrils daily as needed for allergies or rhinitis., Disp: 16 g, Rfl: 11   gabapentin (NEURONTIN) 100 MG capsule, TAKE 3 CAPSULES (300 MG TOTAL) BY MOUTH 2 (TWO) TIMES DAILY., Disp: 180 capsule, Rfl: 5   glucose 4 GM chewable tablet, Chew 1 tablet (4 g total) by mouth as needed for low blood sugar., Disp: 50 tablet, Rfl: 12   hydrochlorothiazide (HYDRODIURIL) 25 MG tablet, Take 1 tablet (25 mg total) by mouth daily., Disp: 60 tablet, Rfl: 0   insulin aspart (NOVOLOG FLEXPEN) 100 UNIT/ML FlexPen, INJECT 25-30 UNITS INTO THE SKIN  SEE ADMIN INSTRUCTIONS. INJECT 30 UNITS SUBCUTANEOUSLY IN THE MORNING, INJECT 25 UNITS SUBCUTANEOUSLY WITH LUNCH & INJECT 25 UNITS SUBCUTANEOUSLY WITH DINNER., Disp: 30 mL, Rfl: 1   Insulin Glargine (BASAGLAR KWIKPEN) 100 UNIT/ML, INJECT 30 UNITS INTO THE SKIN AT BEDTIME., Disp: 15 mL, Rfl: 3   Insulin Pen Needle (BD PEN NEEDLE NANO 2ND GEN) 32G X 4 MM MISC, USE AS DIRECTED, Disp: 100 each, Rfl: 3   LIFESCAN FINEPOINT LANCETS MISC, Use to test for blood sugar three times daily dx: E11.51, Disp: 300 each, Rfl: 1   metoprolol succinate (TOPROL-XL) 25 MG 24 hr tablet, TAKE 1 TABLET BY MOUTH EVERY DAY WITH OR IMMEDIATELY FOLLOWING A MEAL, Disp: 90 tablet, Rfl: 1   nitroGLYCERIN (NITROSTAT) 0.4 MG SL tablet, PLACE 1 TABLET UNDER THE TONGUE EVERY 5 MINUTES AS NEEDED FOR CHEST PAIN., Disp: 50 tablet, Rfl: 0   ONETOUCH ULTRA test strip, USE TO TEST FOR BLOOD SUGAR THREE TIMES DAILY DX: E11.51, Disp: 300 strip, Rfl: 11   pantoprazole (PROTONIX) 40 MG tablet, TAKE ONE TABLET BY MOUTH ONCE DAILY FOR STOMACH, Disp: 90 tablet, Rfl: 3   PRALUENT 150 MG/ML SOAJ, INJECT 1 PEN INTO THE SKIN EVERY 14  (FOURTEEN) DAYS., Disp: 6 mL, Rfl: 1   pramipexole (MIRAPEX) 0.5 MG tablet, Take 0.5 mg by mouth as needed., Disp: , Rfl:    valsartan (DIOVAN) 160 MG tablet, TAKE 1 TABLET (160 MG TOTAL) BY MOUTH EVERY EVENING., Disp: 90 tablet, Rfl: 3   Budeson-Glycopyrrol-Formoterol (BREZTRI AEROSPHERE) 160-9-4.8 MCG/ACT AERO, Inhale 2 puffs into the lungs in the morning and at bedtime. (Patient not taking: Reported on 11/06/2022), Disp: 5.9 g, Rfl: 0 No current facility-administered medications for this visit.  Facility-Administered Medications Ordered in Other Visits:    [COMPLETED] hydrALAZINE (APRESOLINE) injection 10 mg, 10 mg, Intravenous, Once, [name], MD, 10 mg at 05/01/21 1054   Radiology   No results found.   Cardiac Studies:   Lower Extremity Arterial Duplex 06/22/2020: No hemodynamically significant stenoses are identified in the right or left lower extremity arterial system. Mildly  abnormal biphasic waveform noted throughout the lower extremity. The right and left mid posterior tibial and mid anterior tibial arteries are non-compressible due to medial arteriosclerosis.   PCV ECHOCARDIOGRAM COMPLETE 01/04/2021 Normal LV systolic function with visual EF 60-65%. Left ventricle cavity is normal in size. Mild left ventricular hypertrophy. Normal global wall motion. Normal diastolic filling pattern, normal LAP. Mild (Grade I) aortic regurgitation. Mild tricuspid regurgitation. No evidence of pulmonary hypertension. Compared to study 06/22/2020 MR is now resolved otherwise no significant change.   Left Heart Catheterization 05/01/21:  LV 135/8, EDP 18 mmHg.  Ao 142/72, mean 104 mmHg.  No pressure gradient across the aortic valve. Left main: Not present LAD: Moderate to large vessel in the proximal segment however it is diffusely diseased.  Proximal 3.0 x 12 mm Promus DES placed 05/03/2014 widely patent.  Mid LAD 2.75 x 38 mm Promus Premier DES, balloon angioplasty of D1 and distal LAD performed  03/29/2013 is widely patent. CX: Large vessel, ostial circumflex 30 to 40% stenosis unchanged from prior catheterization, a 3.5 x 16 mm Promus DES placed 04/08/2014 is widely patent. RCA: Large vessel, mid segment has a diffuse long segment 99% stenosis.  Previously placed 2.74 x 24 mm Promus Premier DES on 12/15/2012 is widely patent.   Interventional data: Successful PTCA and stenting of the mid and distal right coronary artery with 2 overlapping 2.5 x 38 mm and a 2.5  x 18 mm Onyx frontier DES, distal end of the stent overlapped 2.75 x 24 mm Promus Premier DES placed 12/15/2012.  TIMI II to TIMI-3 flow at the end of the procedure.    04/08/2014: 3.5 x 16 mm Promus DES  05/03/2014:  Proximal 3.0 x 12 mm Promus DES placed 05/03/2014.  Mid LAD 2.75 x 38 mm Promus Premier DES  05/01/2021: 2.5 x 38 mm and a 2.5 x 18 mm Onyx frontier DES, distal end of the stent overlapped to 2.75 x 24 mm Promus Premier DES placed 12/15/2012    PCV CARDIAC STRESS TEST 09/07/2021  Narrative Exercise treadmill stress test 09/07/2021: Exercise treadmill stress test performed using Bruce protocol.  Patient reached 7.7 METS, and 86% of age predicted maximum heart rate. Exercise capacity was fair. Normal heart rate response. Resting BP 130/82 mmHg, with hypertensive response, peak BP 240/80 mmHg.  No chest pain reported.  Dyspnea reported. Stress EKG revealed no ischemic changes. Low risk study.   EKG   EKG 11/06/2022: Normal sinus rhythm at rate of 60 bpm, left anterior fascicular block.  LVH.  Compared to 02/08/2022, no significant change.  Previously baseline artifact.   Assessment     ICD-10-CM   1. Coronary artery disease of native artery of native heart with stable angina pectoris (HCC)  I25.118 EKG 12-Lead    2. Essential hypertension  I10 hydrochlorothiazide (HYDRODIURIL) 25 MG tablet    3. Pure hypercholesterolemia  E78.00     4. 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    Dispense:  60 tablet    Refill:  0     There are no discontinued medications.    Recommendations:   Kenneth Steffek Sr.  is a 72 y.o. Caucasian male with coronary artery disease, with stents to his LAD, RCA and circumflex, remote balloon angioplasty to distal LAD and diagonal has remained patent,  hypertension, hyperlipidemia, GERD, Barrett's esophagus, statin intolerance, uncontrolled diabetes mellitus, severe restless leg syndrome.    1. Coronary artery disease of native artery of native heart with stable angina pectoris [name]) Patient presents for a 42-month office visit, has remained stable without recurrence of angina pectoris and he has not used any sublingual nitroglycerin.  There is no change in his EKG.  Continue present medical management, lipids under excellent control, blood pressure is slightly elevated today which I will address. - EKG 12-Lead  2. Essential hypertension I have added HCTZ to his present medical regimen, he does have valsartan on board, we could certainly consider increasing this to 320/25 mg if blood pressure is not well-controlled, advised patient to keep an eye on his blood pressure and to report to [place] if it is >130/80 mmHg. - hydrochlorothiazide (HYDRODIURIL) 25 MG tablet; Take 1 tablet (25 mg total) by mouth daily.  Dispense: 60 tablet; Refill: 0  3. Pure hypercholesterolemia Reviewed his external labs, LDL is at goal.  He has statin myopathy and hence presently on Praluent, continue the same.  He has severe restless leg syndrome as well.  4. Statin myopathy As dictated above he has been unable to tolerate statins due to severe restless leg syndrome and myopathy.  I will see him back on annual basis.   1. Coronary artery disease of native artery of native heart with stable angina pectoris (HCC)  Patient has not had any further episodes of chest  pain.  Continue present medical management.  2.  Diabetes mellitus type 2, insulin dependent (HCC) He was prescribed Ozempic, previously had not picked it up however on his last office visit 6 weeks ago I started him on 0.25 mg today and clear-cut instructions given.  I have discussed with him to reduce his intake of food to avoid nausea and also to have small meals.  He has lost about 5 pounds in weight.  He is restless leg has got extremely severe now.  He wishes to continue to using Ozempic and does not attribute any side effect from Ozempic to restless leg which are 2 different entities.  I discussed with him that improved diabetes control will help with improving potential of peripheral neuropathy and neuronal damage due to hyperglycemia.  Will increase the dose from 0.5 mg to 1 mg and I will request Abbey Chatters to see whether she can continue to manage the diabetes along with weight loss going further, I will see him back in 6 months for follow-up.  He has reactive hypertension, due to severe restless leg he has not slept all night last night.  Blood pressure recordings last several times have been under excellent control.  Hence I did not make any changes to his medications.3   [name], MD, [facility] 11/06/2022, 9:43 AM Office: [phone] Fax: [phone] Pager: [phone]

## 2022-11-13 DIAGNOSIS — E119 Type 2 diabetes mellitus without complications: Secondary | ICD-10-CM | POA: Diagnosis not present

## 2022-11-13 LAB — HM DIABETES EYE EXAM

## 2022-11-26 ENCOUNTER — Other Ambulatory Visit: Payer: Self-pay | Admitting: Adult Health

## 2022-11-26 DIAGNOSIS — J410 Simple chronic bronchitis: Secondary | ICD-10-CM

## 2022-11-30 ENCOUNTER — Other Ambulatory Visit: Payer: Self-pay | Admitting: Cardiology

## 2022-12-02 DIAGNOSIS — M5416 Radiculopathy, lumbar region: Secondary | ICD-10-CM | POA: Diagnosis not present

## 2022-12-02 DIAGNOSIS — M25551 Pain in right hip: Secondary | ICD-10-CM | POA: Diagnosis not present

## 2022-12-02 DIAGNOSIS — M545 Low back pain, unspecified: Secondary | ICD-10-CM | POA: Diagnosis not present

## 2022-12-04 ENCOUNTER — Ambulatory Visit: Payer: Medicare Other | Admitting: Internal Medicine

## 2022-12-04 DIAGNOSIS — Z570 Occupational exposure to noise: Secondary | ICD-10-CM | POA: Diagnosis not present

## 2022-12-04 DIAGNOSIS — H903 Sensorineural hearing loss, bilateral: Secondary | ICD-10-CM | POA: Diagnosis not present

## 2022-12-04 DIAGNOSIS — E119 Type 2 diabetes mellitus without complications: Secondary | ICD-10-CM | POA: Diagnosis not present

## 2022-12-05 NOTE — Progress Notes (Signed)
Patient ID: Kenneth Westenhaver Sr., male   DOB: 10-09-1950, 72 y.o.   MRN: [id number]  HPI: Kenneth Arrison Sr. is a 71 y.o.-year-old male, returning for follow-up for DM2, dx in 2016, insulin-dependent  since 2020, uncontrolled, with complications (CAD, history of CVA, CKD stage III). Pt. previously saw Dr. Everardo All, but last visit with me 4 months ago.  Interim history: No increased urination, nausea, chest pain. He has blurry vision - resolved after changing contacts. He continues to play golf. He has been more erratic with his food lately as he was traveling.  His wife now retired and able to start eating more home-cooked meals.  Reviewed HbA1c: Lab Results  Component Value Date   HGBA1C 9.1 (H) 07/22/2022   HGBA1C 7.8 (A) 02/28/2022   HGBA1C 8.1 (H) 10/05/2021   HGBA1C 8.3 (A) 05/07/2021   HGBA1C 7.9 (H) 01/15/2021   HGBA1C 8.1 (A) 12/25/2020   HGBA1C 8.6 (A) 09/14/2020   HGBA1C 8.7 (H) 06/30/2020   HGBA1C 8.6 (H) 02/22/2020   HGBA1C 8.8 (H) 10/18/2019   She was previously on: - Basaglar 30 units at bedtime - Novolog 30-25-25 units (3x a day), immediately before meals: 10-20 units before B 15 units before L 15-20 unit before D He had nausea with metformin - stopped. He tried Trulicity >> nausea.  Currently on: - Basaglar 35 >> 30 units at bedtime - Novolog: 15 minutes before meals: 10-20 units before B 10-15 units before L 15-20 unit before D He stopped Ozempic 0.5 mg weekly >> nausea, cannot function.  Pt checks his sugars >4 a day with his CGM:  Previously:  Previously:  Lowest sugar was 46 >> 54>> 50s; he has hypoglycemia awareness at 70.  Highest sugar was 400 >> 350 >> 400.  Glucometer: One Touch ultra  - + CKD, last BUN/creatinine:  Lab Results  Component Value Date   BUN 18 07/22/2022   BUN 20 03/05/2022   CREATININE 1.15 07/22/2022   CREATININE 1.25 (H) 03/05/2022   Lab Results  Component Value Date   MICRALBCREAT 12 07/22/2022   MICRALBCREAT 20 03/06/2020    MICRALBCREAT SEE NOTE 08/27/2016   MICRALBCREAT <10.2 08/26/2013  On Diovan 80 mg daily.  -+HL; last set of lipids: Lab Results  Component Value Date   CHOL 150 01/21/2022   HDL 38 (L) 01/21/2022   LDLCALC 85 01/21/2022   LDLDIRECT 110 (H) 06/17/2019   TRIG 175 (H) 01/21/2022   CHOLHDL 3.9 01/21/2022  He is on Praluent.  He had side effects from statins in the past.  - last eye exam was 2024. No DR reportedly.   - +  restless leg syndrome.  He is on Neurontin. Also, on pramipexole for restless leg syndrome.  He sees neurology.  Last foot exam 10/22/2021.  He also has a history of HTN, GERD, Barrett's esophagus, iron deficiency anemia, memory loss.  ROS: + see HPI  Past Medical History:  Diagnosis Date   Arthritis    "left thumb" (05/23/2017) right shoulder   Barrett's esophagus    "we've been told that it's all gone; still take RX for GERD" (05/23/2017)   Bilateral swelling of feet and ankles x 3 weeks as of 12-01-2019   CAD (coronary artery disease), native coronary artery    Coronary angiogram 05/03/2014:  Proximal LAD 3.0x12 mm Promus premier stent.  04/08/2014: Mid Cx 3.5 x 16 mm Promus DES.  03/29/2013: Mid LAD 2.75 x 38 mm Promus Premier drug-eluting stent, balloon angioplasty of D1 and distal  LAD and stenting of distal RCA with 2.75 x 24 mm promos Premier drug-eluting stent 12/15/2012 widely patent.   Cervicalgia    Chronic bronchitis (HCC)    "q yr" (05/23/2017)   Complication of anesthesia    " I do not wake up very well "  slow to wake up   COVID-19 05/2019   all covid symptoms in hospital x 5 days, all symptoms resolved took monoclonal antibody tx    Dyspnea    with exertion   Family history of adverse reaction to anesthesia    "son w/PONV"   GERD (gastroesophageal reflux disease)    Heart murmur    "grew out of it"    Hyperlipidemia    Hypertension    Hypoglycemia, unspecified    IDDM (insulin dependent diabetes mellitus)    type 2   Impotence of organic  origin    Iron deficiency anemia    Memory loss    mild   Other malaise and fatigue    Pneumonia 05/2019   PONV (postoperative nausea and vomiting)    after wisdom teeth pulled, no problems with other surgeries   Restless leg    Rotator cuff arthropathy    Stroke (HCC)    Tension headache    "sometimes" (05/23/2017)   Unspecified gastritis and gastroduodenitis with hemorrhage    Unstable angina pectoris (HCC) 04/08/2014   Coronary angiogram 05/03/2014:  Proximal LAD 3.0x12 mm Promus premier stent.  04/08/2014: Mid Cx 3.5 x 16 mm Promus DES.  03/29/2013: Mid LAD 2.75 x 38 mm Promus Premier drug-eluting stent, balloon angioplasty of D1 and distal LAD and stenting of distal RCA with 2.75 x 24 mm promos Premier drug-eluting stent 12/15/2012 widely patent.   Past Surgical History:  Procedure Laterality Date   BICEPT TENODESIS Right 12/07/2019   Procedure: RIGHT BICEPS TENODESIS;  Surgeon: Sheral Apley, MD;  Location: [facility];  Service: Orthopedics;  Laterality: Right;   CARDIAC CATHETERIZATION N/A 09/25/2015   Procedure: Left Heart Cath and Coronary Angiography;  Surgeon: [name], MD;  Location: [facility] INVASIVE CV LAB;  Service: Cardiovascular;  Laterality: N/A;   CARDIAC CATHETERIZATION N/A 09/25/2015   Procedure: Intravascular Pressure Wire/FFR Study;  Surgeon: [name], MD;  Location: [facility] INVASIVE CV LAB;  Service: Cardiovascular;  Laterality: N/A;   CORONARY ANGIOPLASTY WITH STENT PLACEMENT  12/15/2012; 04/28/2014; 05/03/2014   "2; 1; 1" , 4 stents 2 balloons   CORONARY STENT INTERVENTION N/A 05/01/2021   Procedure: CORONARY STENT INTERVENTION;  Surgeon: [name], MD;  Location: [facility] INVASIVE CV LAB;  Service: Cardiovascular;  Laterality: N/A;   FRACTIONAL FLOW RESERVE WIRE Right 03/26/2013   Procedure: FRACTIONAL FLOW RESERVE WIRE;  Surgeon: Pamella Pert, MD;  Location: [facility] CATH LAB;  Service: Cardiovascular;  Laterality: Right;   FRACTIONAL FLOW RESERVE WIRE N/A  05/03/2014   Procedure: FRACTIONAL FLOW RESERVE WIRE;  Surgeon: Pamella Pert, MD;  Location: [facility] CATH LAB;  Service: Cardiovascular;  Laterality: N/A;   HERNIA REPAIR  2010   "umbilical"    INCISION AND DRAINAGE ABSCESS Left 05/2007   "groin"    KNEE SURGERY Right 1992;  2000   "calcium deposits removed" (12/15/2012)   LEFT HEART CATH AND CORONARY ANGIOGRAPHY N/A 05/23/2017   Procedure: LEFT HEART CATH AND CORONARY ANGIOGRAPHY;  Surgeon: [name], MD;  Location: [facility] INVASIVE CV LAB;  Service: Cardiovascular;  Laterality: N/A;   LEFT HEART CATH AND CORONARY ANGIOGRAPHY N/A 05/01/2021   Procedure: LEFT HEART CATH  AND CORONARY ANGIOGRAPHY;  Surgeon: [name], MD;  Location: [facility] INVASIVE CV LAB;  Service: Cardiovascular;  Laterality: N/A;   LEFT HEART CATHETERIZATION WITH CORONARY ANGIOGRAM N/A 12/15/2012   Procedure: LEFT HEART CATHETERIZATION WITH CORONARY ANGIOGRAM;  Surgeon: Pamella Pert, MD;  Location: [facility] CATH LAB;  Service: Cardiovascular;  Laterality: N/A;   LEFT HEART CATHETERIZATION WITH CORONARY ANGIOGRAM N/A 03/26/2013   Procedure: LEFT HEART CATHETERIZATION WITH CORONARY ANGIOGRAM;  Surgeon: Pamella Pert, MD;  Location: [facility] CATH LAB;  Service: Cardiovascular;  Laterality: N/A;   LEFT HEART CATHETERIZATION WITH CORONARY ANGIOGRAM N/A 04/08/2014   Procedure: LEFT HEART CATHETERIZATION WITH CORONARY ANGIOGRAM;  Surgeon: Micheline Chapman, MD;  Location: [facility] CATH LAB;  Service: Cardiovascular;  Laterality: N/A;   MOUTH SURGERY Right    PERCUTANEOUS CORONARY STENT INTERVENTION (PCI-S) N/A 05/03/2014   Procedure: PERCUTANEOUS CORONARY STENT INTERVENTION (PCI-S);  Surgeon: Pamella Pert, MD;  Location: [facility] CATH LAB;  Service: Cardiovascular;  Laterality: N/A;   RADIOLOGY WITH ANESTHESIA N/A 02/08/2021   Procedure: MRI WITH ANESTHESIA OF ABDOMEN WITH AND WITHOUT CONSTRAST;  Surgeon: Radiologist, Medication, MD;  Location: [facility] OR;  Service: Radiology;  Laterality: N/A;   ROTATOR  CUFF REPAIR  2022   UMBILICAL HERNIA REPAIR  yrs ago   Social History   Socioeconomic History   Marital status: Married    Spouse name: Pam   Number of children: 2   Years of education: 12   Highest education level: High school graduate  Occupational History   Occupation: Naval architect    Comment: deliveries only now   Occupation: woodworking  Tobacco Use   Smoking status: Never   Smokeless tobacco: Never  Vaping Use   Vaping status: Never Used  Substance and Sexual Activity   Alcohol use: No   Drug use: No   Sexual activity: Not Currently  Other Topics Concern   Not on file  Social History Narrative   Right handed    Caffeine 2 cups daily   Winter time 4 cups daily   Lives family one level home    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   Financial Resource Strain: Low Risk  (01/18/2021)   Overall Financial Resource Strain (CARDIA)    Difficulty of Paying Living Expenses: Not very hard  Food Insecurity: No Food Insecurity (01/18/2021)   Hunger Vital Sign    Worried About Running Out of Food in the Last Year: Never true    Ran Out of Food in the Last Year: Never true  Transportation Needs: No Transportation Needs (01/18/2021)   PRAPARE - Administrator, Civil Service (Medical): No    Lack of Transportation (Non-Medical): No  Physical Activity: Inactive (04/25/2017)   Exercise Vital Sign    Days of Exercise per Week: 0 days    Minutes of Exercise per Session: 0 min  Stress: No Stress Concern Present (04/25/2017)   [employer] of Occupational Health - Occupational Stress Questionnaire    Feeling of Stress : Only a little  Social Connections: Moderately Integrated (04/25/2017)   Social Connection and Isolation Panel [NHANES]    Frequency of Communication with Friends and Family: More than three times a week    Frequency of Social Gatherings with Friends and Family: More than three times a week    Attends Religious Services: More than 4 times per  year    Active Member of [employer] or Organizations: No    Attends Banker Meetings: Never  Marital Status: Married  Catering manager Violence: Not At Risk (04/25/2017)   Humiliation, Afraid, Rape, and Kick questionnaire    Fear of Current or Ex-Partner: No    Emotionally Abused: No    Physically Abused: No    Sexually Abused: No   Current Outpatient Medications on File Prior to Visit  Medication Sig Dispense Refill   albuterol (VENTOLIN HFA) 108 (90 Base) MCG/ACT inhaler TAKE 2 PUFFS BY MOUTH EVERY 6 HOURS AS NEEDED FOR WHEEZE OR SHORTNESS OF BREATH 6.7 each 2   acetaminophen (TYLENOL) 500 MG tablet Take 500 mg by mouth every 6 (six) hours as needed for mild pain, moderate pain, headache or fever.     aspirin EC 81 MG tablet Take 1 tablet (81 mg total) by mouth daily. Swallow whole. 90 tablet 3   benzonatate (TESSALON PERLES) 100 MG capsule Take 2 capsules (200 mg total) by mouth 3 (three) times daily as needed for cough. 30 capsule 0   Budeson-Glycopyrrol-Formoterol (BREZTRI AEROSPHERE) 160-9-4.8 MCG/ACT AERO Inhale 2 puffs into the lungs in the morning and at bedtime. (Patient not taking: Reported on 11/06/2022) 5.9 g 0   clopidogrel (PLAVIX) 75 MG tablet Take 1 tablet (75 mg total) by mouth daily. 90 tablet 3   Continuous Blood Gluc Receiver (FREESTYLE LIBRE 14 DAY READER) DEVI Inject 1 Device as directed daily as needed. Use once daily as direct to check blood sugar E11.51 1 each 0   Continuous Blood Gluc Sensor (FREESTYLE LIBRE 14 DAY SENSOR) MISC USE TO TEST BLOOD SUGAR THREE TIMES DAILY. E11.51. E11.59 1 each 12   diphenhydramine-acetaminophen (TYLENOL PM) 25-500 MG TABS tablet Take 1 tablet by mouth at bedtime as needed.     fluticasone (FLONASE) 50 MCG/ACT nasal spray Place 1 spray into both nostrils daily as needed for allergies or rhinitis. 16 g 11   gabapentin (NEURONTIN) 100 MG capsule TAKE 3 CAPSULES (300 MG TOTAL) BY MOUTH 2 (TWO) TIMES DAILY. 180 capsule 5   glucose 4  GM chewable tablet Chew 1 tablet (4 g total) by mouth as needed for low blood sugar. 50 tablet 12   hydrochlorothiazide (HYDRODIURIL) 25 MG tablet Take 1 tablet (25 mg total) by mouth daily. 60 tablet 0   insulin aspart (NOVOLOG FLEXPEN) 100 UNIT/ML FlexPen INJECT 25-30 UNITS INTO THE SKIN SEE ADMIN INSTRUCTIONS. INJECT 30 UNITS SUBCUTANEOUSLY IN THE MORNING, INJECT 25 UNITS SUBCUTANEOUSLY WITH LUNCH & INJECT 25 UNITS SUBCUTANEOUSLY WITH DINNER. 30 mL 1   Insulin Glargine (BASAGLAR KWIKPEN) 100 UNIT/ML INJECT 30 UNITS INTO THE SKIN AT BEDTIME. 15 mL 3   Insulin Pen Needle (BD PEN NEEDLE NANO 2ND GEN) 32G X 4 MM MISC USE AS DIRECTED 100 each 3   LIFESCAN FINEPOINT LANCETS MISC Use to test for blood sugar three times daily dx: E11.51 300 each 1   metoprolol succinate (TOPROL-XL) 25 MG 24 hr tablet TAKE 1 TABLET BY MOUTH EVERY DAY WITH OR IMMEDIATELY FOLLOWING A MEAL 90 tablet 1   nitroGLYCERIN (NITROSTAT) 0.4 MG SL tablet PLACE 1 TABLET UNDER THE TONGUE EVERY 5 MINUTES AS NEEDED FOR CHEST PAIN. 50 tablet 0   ONETOUCH ULTRA test strip USE TO TEST FOR BLOOD SUGAR THREE TIMES DAILY DX: E11.51 300 strip 11   pantoprazole (PROTONIX) 40 MG tablet TAKE ONE TABLET BY MOUTH ONCE DAILY FOR STOMACH 90 tablet 3   PRALUENT 150 MG/ML SOAJ INJECT 1 PEN INTO THE SKIN EVERY 14 (FOURTEEN) DAYS. 6 mL 1   pramipexole (MIRAPEX) 0.5 MG tablet  Take 0.5 mg by mouth as needed.     valsartan (DIOVAN) 160 MG tablet TAKE 1 TABLET (160 MG TOTAL) BY MOUTH EVERY EVENING. 90 tablet 3   Current Facility-Administered Medications on File Prior to Visit  Medication Dose Route Frequency Provider Last Rate Last Admin   [COMPLETED] hydrALAZINE (APRESOLINE) injection 10 mg  10 mg Intravenous Once [name], MD   10 mg at 05/01/21 1054   Allergies  Allergen Reactions   Amlodipine Other (See Comments)    Worsening restless legs, dyspnea and leg swelling   Lyrica [Pregabalin] Other (See Comments)    Made patient very lethargic the next  morning, very hard to patient to function, move, etc.    Other Other (See Comments)    Convulsions, Causes Restless Legs, rhabdomyolysis   Statins Other (See Comments)    Causes Restless Legs, rhabdomyolysis   Ativan [Lorazepam] Other (See Comments)    Markedly increased restless legs   Trulicity [Dulaglutide] Other (See Comments)    GI side effects    Family History  Adopted: Yes  Problem Relation Age of Onset   Emphysema Mother    PE: BP 130/84 (BP Location: Left Arm, Patient Position: Sitting, Cuff Size: Normal)   Pulse 77   Resp 16   Ht 5\' 8"  (1.727 m)   Wt 202 lb 6.4 oz (91.8 kg)   SpO2 96%   BMI 30.77 kg/m  Wt Readings from Last 10 Encounters:  12/06/22 202 lb 6.4 oz (91.8 kg)  11/06/22 202 lb (91.6 kg)  10/28/22 205 lb 9.6 oz (93.3 kg)  09/12/22 204 lb 3.2 oz (92.6 kg)  08/19/22 200 lb 6.4 oz (90.9 kg)  07/24/22 202 lb 9.6 oz (91.9 kg)  07/22/22 204 lb (92.5 kg)  05/20/22 196 lb (88.9 kg)  04/30/22 195 lb 8 oz (88.7 kg)  04/10/22 201 lb (91.2 kg)   Constitutional: overweight, in NAD Eyes: no exophthalmos ENT: no thyromegaly, no cervical lymphadenopathy Cardiovascular: RRR, No MRG Respiratory: CTA B Musculoskeletal: no deformities Skin:  no rashes Neurological: no tremor with outstretched hands Diabetic Foot Exam - Simple   Simple Foot Form Diabetic Foot exam was performed with the following findings: Yes 12/06/2022 11:15 AM  Visual Inspection No deformities, no ulcerations, no other skin breakdown bilaterally: Yes Sensation Testing Intact to touch and monofilament testing bilaterally: Yes Pulse Check Posterior Tibialis and Dorsalis pulse intact bilaterally: Yes Comments    ASSESSMENT: 1. DM2, insulin-dependent, uncontrolled, with complications - CAD - h/o CVA - CKD stage III - severe hypoglycemia in 2019  2. HL  PLAN:  1. Patient with longstanding, uncontrolled, type 2 diabetes, basal/bolus insulin regimen and previously on weekly GLP-1 receptor  agonist, but stopped since last visit due to nausea.  He had worsening control at last visit, when HbA1c increased from 7.8% to 9.1%.  At that time, reviewing the CGM trends, sugars appears to decrease drastically after dinner and after lunch but upon questioning, he was mostly taking his NovoLog after the meals, rather than 15 minutes before.  We discussed about how to take this correctly but did not change the rest of the regimen.  The predicted HbA1c from his CGM was actually much better, at 7.2%. CGM interpretation: -At today's visit, we reviewed his CGM downloads: It appears that 25% of values are in target range (goal >70%), while 75% are higher than 180 (goal <25%), and 0% are lower than 70 (goal <4%).  The calculated average blood sugar is 249.  The projected  HbA1c for the next 3 months (GMI) is 9.3%. -Reviewing the CGM trends, sugars appear to be much higher than before, mostly above the target range with significant increases after meals, with the largest increase after lunch and then also after dinner.  Upon questioning, he was traveling and was not able to follow a strict diet and also he admits to taking NovoLog mostly after he eats.  This is probably the reason why the sugars are fluctuating in a sawtooth manner throughout the day.  We discussed that he absolutely needs to take the NovoLog at least 15 minutes before the meals.  However, at today's visit, we also discussed about possibly changing to [place], which is taken at the beginning of the meal.  I will try to send this to the pharmacy and see if he can obtain it.  I also gave him a sliding scale at today's visit.  He mentions that as of now, he is taking 10 units of NovoLog for correction if the sugars are above 200 but 20 units if they are above 300.  Will keep approximately the same parameters but give him a more detailed sliding scale.  I also advised him to increase Basaglar as he would benefit from all of the sugars to be  lower. -Ultimately, improving diet would greatly help. - I suggested to:  Patient Instructions  Please increase: - Basaglar 36 units daily  Change from NovoLog to Max at the start of the meal: 10-20 units.   Use the following Fiasp sliding scale:  150-175: +4 176-200: +6 201-250: +10 251-300: +14 >300: +18  Please return in 3 months.  - we checked his HbA1c: 8.4% (lower) - advised to check sugars at different times of the day - 4x a day, rotating check times - advised for yearly eye exams >> he is UTD - return to clinic in 3 months   2. HL -Latest lipid panel from 01/2022 was reviewed: LDL above target, triglycerides also slightly high, HDL low: Lab Results  Component Value Date   CHOL 150 01/21/2022   HDL 38 (L) 01/21/2022   LDLCALC 85 01/21/2022   LDLDIRECT 110 (H) 06/17/2019   TRIG 175 (H) 01/21/2022   CHOLHDL 3.9 01/21/2022  -He is on Praluent without side effects: He previously had side effects from statins: Restless legs and rhabdomyolysis.  Carlus Pavlov, MD PhD [facility] Endocrinology

## 2022-12-06 ENCOUNTER — Encounter: Payer: Self-pay | Admitting: Internal Medicine

## 2022-12-06 ENCOUNTER — Ambulatory Visit (INDEPENDENT_AMBULATORY_CARE_PROVIDER_SITE_OTHER): Payer: Medicare Other | Admitting: Internal Medicine

## 2022-12-06 VITALS — BP 130/84 | HR 77 | Resp 16 | Ht 68.0 in | Wt 202.4 lb

## 2022-12-06 DIAGNOSIS — E1165 Type 2 diabetes mellitus with hyperglycemia: Secondary | ICD-10-CM

## 2022-12-06 DIAGNOSIS — E785 Hyperlipidemia, unspecified: Secondary | ICD-10-CM

## 2022-12-06 DIAGNOSIS — E1159 Type 2 diabetes mellitus with other circulatory complications: Secondary | ICD-10-CM | POA: Diagnosis not present

## 2022-12-06 DIAGNOSIS — Z794 Long term (current) use of insulin: Secondary | ICD-10-CM | POA: Diagnosis not present

## 2022-12-06 LAB — POCT GLYCOSYLATED HEMOGLOBIN (HGB A1C): Hemoglobin A1C: 8.4 % — AB (ref 4.0–5.6)

## 2022-12-06 MED ORDER — FIASP FLEXTOUCH 100 UNIT/ML ~~LOC~~ SOPN: 15.0000 [IU] | PEN_INJECTOR | Freq: Three times a day (TID) | SUBCUTANEOUS | 3 refills | Status: DC

## 2022-12-06 MED ORDER — BASAGLAR KWIKPEN 100 UNIT/ML ~~LOC~~ SOPN: 36.0000 [IU] | PEN_INJECTOR | Freq: Every day | SUBCUTANEOUS | 3 refills | Status: DC

## 2022-12-06 NOTE — Patient Instructions (Addendum)
Please increase: - Basaglar 36 units daily  Change from NovoLog to [place] at the start of the meal: 10-20 units.   Use the following Fiasp sliding scale:  150-175: +4 176-200: +6 201-250: +10 251-300: +14 >300: +18  Please return in 3 months.

## 2022-12-06 NOTE — Addendum Note (Signed)
Addended by: Tera Partridge on: 12/06/2022 01:41 PM   Modules accepted: Orders

## 2022-12-09 ENCOUNTER — Emergency Department (HOSPITAL_BASED_OUTPATIENT_CLINIC_OR_DEPARTMENT_OTHER)
Admission: EM | Admit: 2022-12-09 | Discharge: 2022-12-09 | Disposition: A | Discharge status: Home / Self Care | Payer: Medicare Other | Attending: Emergency Medicine | Admitting: Emergency Medicine

## 2022-12-09 ENCOUNTER — Encounter (HOSPITAL_BASED_OUTPATIENT_CLINIC_OR_DEPARTMENT_OTHER): Payer: Self-pay | Admitting: Emergency Medicine

## 2022-12-09 ENCOUNTER — Other Ambulatory Visit: Payer: Self-pay

## 2022-12-09 ENCOUNTER — Telehealth: Payer: Self-pay

## 2022-12-09 DIAGNOSIS — Z79899 Other long term (current) drug therapy: Secondary | ICD-10-CM | POA: Insufficient documentation

## 2022-12-09 DIAGNOSIS — I2510 Atherosclerotic heart disease of native coronary artery without angina pectoris: Secondary | ICD-10-CM | POA: Diagnosis not present

## 2022-12-09 DIAGNOSIS — E119 Type 2 diabetes mellitus without complications: Secondary | ICD-10-CM | POA: Insufficient documentation

## 2022-12-09 DIAGNOSIS — I10 Essential (primary) hypertension: Secondary | ICD-10-CM | POA: Insufficient documentation

## 2022-12-09 DIAGNOSIS — Z794 Long term (current) use of insulin: Secondary | ICD-10-CM | POA: Insufficient documentation

## 2022-12-09 DIAGNOSIS — M25551 Pain in right hip: Secondary | ICD-10-CM | POA: Insufficient documentation

## 2022-12-09 DIAGNOSIS — Z8673 Personal history of transient ischemic attack (TIA), and cerebral infarction without residual deficits: Secondary | ICD-10-CM | POA: Diagnosis not present

## 2022-12-09 DIAGNOSIS — Z955 Presence of coronary angioplasty implant and graft: Secondary | ICD-10-CM | POA: Diagnosis not present

## 2022-12-09 DIAGNOSIS — Z7982 Long term (current) use of aspirin: Secondary | ICD-10-CM | POA: Insufficient documentation

## 2022-12-09 DIAGNOSIS — Z7902 Long term (current) use of antithrombotics/antiplatelets: Secondary | ICD-10-CM | POA: Diagnosis not present

## 2022-12-09 DIAGNOSIS — Z8616 Personal history of COVID-19: Secondary | ICD-10-CM | POA: Diagnosis not present

## 2022-12-09 MED ORDER — KETOROLAC TROMETHAMINE 60 MG/2ML IM SOLN
30.0000 mg | Freq: Once | INTRAMUSCULAR | Status: AC
  Administered 2022-12-09: 30 mg via INTRAMUSCULAR
  Filled 2022-12-09: qty 2

## 2022-12-09 MED ORDER — METAXALONE 800 MG PO TABS: 800.0000 mg | ORAL_TABLET | Freq: Three times a day (TID) | ORAL | 0 refills | Status: DC

## 2022-12-09 MED ORDER — PREDNISONE 10 MG PO TABS: 20.0000 mg | ORAL_TABLET | Freq: Every day | ORAL | 0 refills | Status: DC

## 2022-12-09 MED ORDER — DEXAMETHASONE SODIUM PHOSPHATE 4 MG/ML IJ SOLN
4.0000 mg | Freq: Once | INTRAMUSCULAR | Status: AC
  Administered 2022-12-09: 4 mg via INTRAMUSCULAR
  Filled 2022-12-09: qty 1

## 2022-12-09 MED ORDER — METHOCARBAMOL 500 MG PO TABS
1000.0000 mg | ORAL_TABLET | Freq: Once | ORAL | Status: AC
  Administered 2022-12-09: 1000 mg via ORAL
  Filled 2022-12-09: qty 2

## 2022-12-09 MED ORDER — OXYCODONE-ACETAMINOPHEN 5-325 MG PO TABS
1.0000 | ORAL_TABLET | Freq: Once | ORAL | Status: AC
  Administered 2022-12-09: 1 via ORAL
  Filled 2022-12-09: qty 1

## 2022-12-09 MED ORDER — LIDOCAINE 5 % EX PTCH
2.0000 | MEDICATED_PATCH | CUTANEOUS | Status: DC
  Administered 2022-12-09: 2 via TRANSDERMAL
  Filled 2022-12-09: qty 2

## 2022-12-09 NOTE — ED Provider Notes (Signed)
Patient has stopped moaning and rocking in pain post medication.  There are no non verbal cues of pain.  He asked for repeat medications before the first medications had even had time to take effect.    It is not reasonable to be pain free in a patient with chronic pain.    I do not believe based on the location with ongoing chronic symptoms that this is a DVT.  Patient will need to follow up with his orthopedic surgeon.     Dayja Loveridge, MD 12/09/22 [phone]

## 2022-12-09 NOTE — Telephone Encounter (Signed)
For the duration of the steroid in the system, he will need higher doses of insulin.  We could add a dose of 20 units of Basaglar in the morning and possibly also increasing NovoLog to 35-40 units before meals.  When the sugars start improving, we can go back to the previous regimen.

## 2022-12-09 NOTE — ED Provider Notes (Signed)
[place] [facility] Provider Note   CSN: [id number] Arrival date & time: 12/09/22  0205     History  Chief Complaint  Patient presents with   Hip Pain    Kenneth Restivo Sr. is a 72 y.o. male.  The history is provided by the patient.  Hip Pain The current episode started more than 1 week ago (Has chronic back and hip pain that he was recently seen at [facility] for and had MRI of the hip but did not complete MRI of the back.  He is scheduled to follow up on Tuesday for this.). The problem occurs constantly. Pertinent negatives include no chest pain, no abdominal pain, no headaches and no shortness of breath. Nothing aggravates the symptoms. Nothing relieves the symptoms.  Patient with restless leg syndrome on gabapentin and chronic back and hip pain that is worsening presents with right hip pain.  Recently had an MRI at [facility] for his hip but was unable to complete L spine MRI due to leg movement but does not know the result.  He will be going back for L spine MRI tomorrow.  No fevers,  no trauma.  No weakness.      Past Medical History:  Diagnosis Date   Arthritis    "left thumb" (05/23/2017) right shoulder   Barrett's esophagus    "we've been told that it's all gone; still take RX for GERD" (05/23/2017)   Bilateral swelling of feet and ankles x 3 weeks as of 12-01-2019   CAD (coronary artery disease), native coronary artery    Coronary angiogram 05/03/2014:  Proximal LAD 3.0x12 mm Promus premier stent.  04/08/2014: Mid Cx 3.5 x 16 mm Promus DES.  03/29/2013: Mid LAD 2.75 x 38 mm Promus Premier drug-eluting stent, balloon angioplasty of D1 and distal LAD and stenting of distal RCA with 2.75 x 24 mm promos Premier drug-eluting stent 12/15/2012 widely patent.   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  04/08/2014: Mid Cx 3.5 x 16 mm Promus DES.  03/29/2013: Mid LAD 2.75 x 38 mm Promus Premier drug-eluting stent, balloon angioplasty of D1 and distal LAD and stenting of distal RCA with 2.75 x  24 mm promos Premier drug-eluting stent 12/15/2012 widely patent.     Home Medications Prior to Admission medications   Medication Sig Start Date End Date Taking? Authorizing Provider  metaxalone (SKELAXIN) 800 MG tablet Take 1 tablet (800 mg total) by mouth 3 (three) times daily. 12/09/22  Yes Arber Wiemers, MD  predniSONE (DELTASONE) 10 MG tablet Take 2 tablets (20 mg total) by mouth daily. 12/09/22  Yes Raun Routh, MD  acetaminophen (TYLENOL) 500 MG tablet Take 500 mg by mouth every 6 (six) hours as needed for mild pain, moderate pain, headache or fever.    [provider]  albuterol (VENTOLIN HFA) 108 (90 Base) MCG/ACT inhaler TAKE 2 PUFFS BY MOUTH EVERY 6 HOURS AS NEEDED FOR WHEEZE OR SHORTNESS OF BREATH 11/26/22   [name], NP  aspirin EC 81 MG tablet Take 1 tablet (81 mg total) by mouth daily.  Swallow whole. 04/11/21   Cantwell, Celeste C, PA-C  benzonatate (TESSALON PERLES) 100 MG capsule Take 2 capsules (200 mg total) by mouth 3 (three) times daily as needed for cough. 05/20/22   Fargo, Amy E, NP  Budeson-Glycopyrrol-Formoterol (BREZTRI AEROSPHERE) 160-9-4.8 MCG/ACT AERO Inhale 2 puffs into the lungs in the morning and at bedtime. 11/07/21   Luciano Cutter, MD  clopidogrel (PLAVIX) 75 MG tablet Take 1 tablet (75 mg total) by mouth daily. 06/19/22   [name], MD  Continuous Blood Gluc Sensor (FREESTYLE LIBRE 14 DAY SENSOR) MISC USE TO TEST BLOOD SUGAR THREE TIMES DAILY. E11.51. E11.59 08/15/21   [name], NP  diphenhydramine-acetaminophen (TYLENOL PM) 25-500 MG TABS tablet Take 1 tablet by mouth at bedtime as needed.    [provider]  fluticasone (FLONASE) 50 MCG/ACT nasal spray Place 1 spray into both nostrils daily as needed for allergies or rhinitis. 10/11/21   [name], NP  gabapentin (NEURONTIN) 100 MG capsule TAKE 3 CAPSULES (300 MG TOTAL) BY MOUTH 2 (TWO) TIMES DAILY. 10/18/22   Antony Madura, MD  glucose 4 GM chewable tablet Chew 1 tablet (4 g total) by mouth as needed for low blood sugar. 07/28/20   Fargo, Amy E, NP  hydrochlorothiazide (HYDRODIURIL) 25 MG tablet Take 1 tablet (25 mg total) by mouth daily. 11/06/22 02/04/23  [name], MD  insulin aspart (FIASP FLEXTOUCH) 100 UNIT/ML FlexTouch Pen Inject 15-25 Units into the skin 3 (three) times daily before meals. 12/06/22   Carlus Pavlov, MD  insulin aspart (NOVOLOG FLEXPEN) 100 UNIT/ML FlexPen INJECT 25-30 UNITS INTO THE SKIN SEE ADMIN INSTRUCTIONS. INJECT 30 UNITS SUBCUTANEOUSLY IN THE MORNING, INJECT 25 UNITS SUBCUTANEOUSLY WITH LUNCH & INJECT 25 UNITS SUBCUTANEOUSLY WITH DINNER. 09/18/22   Carlus Pavlov, MD  Insulin Glargine (BASAGLAR KWIKPEN) 100 UNIT/ML Inject 36 Units into the skin at bedtime. 12/06/22   Carlus Pavlov, MD  Insulin Pen Needle (BD PEN NEEDLE NANO 2ND GEN) 32G X 4 MM MISC USE  AS DIRECTED 07/16/22   [name], NP  [facility] FINEPOINT LANCETS MISC Use to test for blood sugar three times daily dx: E11.51 06/01/18   [name], NP  metoprolol succinate (TOPROL-XL) 25 MG 24 hr tablet TAKE 1 TABLET BY MOUTH EVERY DAY WITH OR IMMEDIATELY FOLLOWING A MEAL 06/04/22   [name], NP  nitroGLYCERIN (NITROSTAT) 0.4 MG SL tablet PLACE 1 TABLET UNDER THE TONGUE EVERY 5 MINUTES AS NEEDED FOR CHEST PAIN. 07/22/22   [name], NP  ONETOUCH ULTRA test strip USE TO TEST FOR BLOOD SUGAR THREE  TIMES DAILY DX: E11.51 12/04/18   Reed, Tiffany L, DO  pantoprazole (PROTONIX) 40 MG tablet TAKE ONE TABLET BY MOUTH ONCE DAILY FOR STOMACH 10/14/22   [name], NP  PRALUENT 150 MG/ML SOAJ INJECT 1 PEN INTO THE SKIN EVERY 14 (FOURTEEN) DAYS. 12/31/21   [name], MD  pramipexole (MIRAPEX) 0.5 MG tablet Take 0.5 mg by mouth as needed.    [provider]  valsartan (DIOVAN) 160 MG tablet TAKE 1 TABLET (160 MG TOTAL) BY MOUTH EVERY EVENING. 12/02/22   [name], MD      Allergies    Amlodipine, Lyrica [pregabalin], Other, Statins, Ativan [lorazepam], and Trulicity [dulaglutide]    Review of Systems   Review of Systems  Constitutional:  Negative for fever.  Respiratory:  Negative for shortness of breath.   Cardiovascular:  Negative for chest pain.  Gastrointestinal:  Negative for abdominal pain.  Musculoskeletal:  Positive for arthralgias and back pain.  Neurological:  Negative for headaches.  All other systems reviewed and are negative.   Physical Exam Updated Vital Signs BP (!) 193/89 (BP Location: Left Arm)   Pulse 75   Temp 97.9 F (36.6 C) (Oral)   Resp 20   Ht 5\' 8"  (1.727 m)   Wt 92.5 kg   SpO2 95%   BMI 31.02 kg/m  Physical Exam Vitals and nursing note reviewed. Exam conducted with a chaperone present.  Constitutional:      General: He is not in acute distress.    Appearance: He is well-developed. He is not diaphoretic.  HENT:      Head: Normocephalic and atraumatic.     Nose: Nose normal.  Eyes:     Conjunctiva/sclera: Conjunctivae normal.     Pupils: Pupils are equal, round, and reactive to light.  Cardiovascular:     Rate and Rhythm: Normal rate and regular rhythm.     Pulses: Normal pulses.     Heart sounds: Normal heart sounds.  Pulmonary:     Effort: Pulmonary effort is normal.     Breath sounds: Normal breath sounds. No wheezing or rales.  Abdominal:     General: Bowel sounds are normal.     Palpations: Abdomen is soft.     Tenderness: There is no abdominal tenderness. There is no guarding or rebound.  Musculoskeletal:        General: No tenderness. Normal range of motion.     Cervical back: Normal range of motion and neck supple.     Right lower leg: No edema.     Left lower leg: No edema.     Comments: Negative Homan's sign  Skin:    General: Skin is warm and dry.     Capillary Refill: Capillary refill takes less than 2 seconds.  Neurological:     General: No focal deficit present.     Mental Status: He is alert and oriented to person, place, and time.     Deep Tendon Reflexes: Reflexes normal.  Psychiatric:     Comments: Moaning in pain      ED Results / Procedures / Treatments   Labs (all labs ordered are listed, but only abnormal results are displayed) Labs Reviewed - No data to display  EKG None  Radiology No results found.  Procedures Procedures    Medications Ordered in ED Medications  lidocaine (LIDODERM) 5 % 2 patch (2 patches Transdermal Patch Applied 12/09/22 0346)  oxyCODONE-acetaminophen (PERCOCET/ROXICET) 5-325 MG per tablet 1 tablet (has no administration in time  range)  ketorolac (TORADOL) injection 30 mg (30 mg Intramuscular Given 12/09/22 0341)  dexamethasone (DECADRON) injection 4 mg (4 mg Intramuscular Given 12/09/22 0417)  methocarbamol (ROBAXIN) tablet 1,000 mg (1,000 mg Oral Given 12/09/22 0416)    ED Course/ Medical Decision Making/ A&P                                  Medical Decision Making Patient with ongoing worsening back and hip pain   Amount and/or Complexity of Data Reviewed Independent Historian: spouse    Details: See above  External Data Reviewed: notes.    Details: Previous notes reviewed  Risk Prescription drug management. Risk Details: Medications given in the ED.  Patient is not pain free. I had explained to hte patient and his with he would not to contact [facility] this am for results and also because they have a pain specialist in their group who may be able to help with patient's ongoing pain. I will start steroids and muscle relaxants.  Patient will need close follow up for imaging.     Final Clinical Impression(s) / ED Diagnoses Final diagnoses:  Right hip pain    I have reviewed the triage vital signs and the nursing notes. Pertinent labs & imaging results that were available during my care of the patient were reviewed by me and considered in my medical decision making (see chart for details). After history, exam, and medical workup I feel the patient has been appropriately medically screened and is safe for discharge home. Pertinent diagnoses were discussed with the patient. Patient was given return precautions.    Rx / DC Orders ED Discharge Orders          Ordered    predniSONE (DELTASONE) 10 MG tablet  Daily        12/09/22 0406    metaxalone (SKELAXIN) 800 MG tablet  3 times daily        12/09/22 0409              Tayna Smethurst, MD 12/09/22 [phone]

## 2022-12-09 NOTE — ED Triage Notes (Signed)
Chronic R hip/back pain that has been worse for 2 days. Recent MRI at [facility] a few days ago, another scheduled for tomorrow. Pt is able to walk on it but states it is really painful.

## 2022-12-09 NOTE — Telephone Encounter (Signed)
Patient called stating that he went to the ED and was given a Steroid injection and is not on an Oral steroid and is wanting to know what to do with his medications since this will spike his blood sugar. Last reading was 350 Current regimen: Novolog 25-30 units TID, and Basaglar 32 units at Bed time.

## 2022-12-10 DIAGNOSIS — M25551 Pain in right hip: Secondary | ICD-10-CM | POA: Diagnosis not present

## 2022-12-12 DIAGNOSIS — M25551 Pain in right hip: Secondary | ICD-10-CM | POA: Diagnosis not present

## 2022-12-18 DIAGNOSIS — M5416 Radiculopathy, lumbar region: Secondary | ICD-10-CM | POA: Diagnosis not present

## 2022-12-23 ENCOUNTER — Encounter (HOSPITAL_BASED_OUTPATIENT_CLINIC_OR_DEPARTMENT_OTHER): Payer: Self-pay | Admitting: Emergency Medicine

## 2022-12-23 ENCOUNTER — Other Ambulatory Visit: Payer: Self-pay

## 2022-12-23 DIAGNOSIS — Z79899 Other long term (current) drug therapy: Secondary | ICD-10-CM | POA: Insufficient documentation

## 2022-12-23 DIAGNOSIS — I10 Essential (primary) hypertension: Secondary | ICD-10-CM | POA: Insufficient documentation

## 2022-12-23 DIAGNOSIS — I2510 Atherosclerotic heart disease of native coronary artery without angina pectoris: Secondary | ICD-10-CM | POA: Diagnosis not present

## 2022-12-23 DIAGNOSIS — Z8616 Personal history of COVID-19: Secondary | ICD-10-CM | POA: Diagnosis not present

## 2022-12-23 DIAGNOSIS — Z7982 Long term (current) use of aspirin: Secondary | ICD-10-CM | POA: Diagnosis not present

## 2022-12-23 DIAGNOSIS — M5126 Other intervertebral disc displacement, lumbar region: Secondary | ICD-10-CM | POA: Diagnosis not present

## 2022-12-23 DIAGNOSIS — R944 Abnormal results of kidney function studies: Secondary | ICD-10-CM | POA: Insufficient documentation

## 2022-12-23 DIAGNOSIS — E871 Hypo-osmolality and hyponatremia: Secondary | ICD-10-CM | POA: Diagnosis not present

## 2022-12-23 DIAGNOSIS — Z794 Long term (current) use of insulin: Secondary | ICD-10-CM | POA: Insufficient documentation

## 2022-12-23 NOTE — ED Triage Notes (Signed)
Pt reports BP was 218/110 at home just prior to coming to ED, reports having a HA  Denies CP, ShoB, n/v, diaphoresis, weakness, or dizziness

## 2022-12-24 ENCOUNTER — Emergency Department (HOSPITAL_BASED_OUTPATIENT_CLINIC_OR_DEPARTMENT_OTHER)
Admission: EM | Admit: 2022-12-24 | Discharge: 2022-12-24 | Disposition: A | Discharge status: Home / Self Care | Payer: Medicare Other | Attending: Emergency Medicine | Admitting: Emergency Medicine

## 2022-12-24 DIAGNOSIS — I10 Essential (primary) hypertension: Secondary | ICD-10-CM

## 2022-12-24 LAB — CBC
HCT: 46.6 % (ref 39.0–52.0)
Hemoglobin: 15.7 g/dL (ref 13.0–17.0)
MCH: 30.3 pg (ref 26.0–34.0)
MCHC: 33.7 g/dL (ref 30.0–36.0)
MCV: 89.8 fL (ref 80.0–100.0)
Platelets: 124 10*3/uL — ABNORMAL LOW (ref 150–400)
RBC: 5.19 MIL/uL (ref 4.22–5.81)
RDW: 12.8 % (ref 11.5–15.5)
WBC: 9.1 10*3/uL (ref 4.0–10.5)
nRBC: 0 % (ref 0.0–0.2)

## 2022-12-24 LAB — BASIC METABOLIC PANEL
Anion gap: 9 (ref 5–15)
BUN: 39 mg/dL — ABNORMAL HIGH (ref 8–23)
CO2: 26 mmol/L (ref 22–32)
Calcium: 9.5 mg/dL (ref 8.9–10.3)
Chloride: 98 mmol/L (ref 98–111)
Creatinine, Ser: 1.62 mg/dL — ABNORMAL HIGH (ref 0.61–1.24)
GFR, Estimated: 45 mL/min — ABNORMAL LOW (ref >60)
Glucose, Bld: 139 mg/dL — ABNORMAL HIGH (ref 70–99)
Potassium: 4.6 mmol/L (ref 3.5–5.1)
Sodium: 133 mmol/L — ABNORMAL LOW (ref 135–145)

## 2022-12-24 LAB — TROPONIN I (HIGH SENSITIVITY)
Troponin I (High Sensitivity): 16 ng/L (ref <18)
Troponin I (High Sensitivity): 18 ng/L — ABNORMAL HIGH (ref <18)

## 2022-12-24 MED ORDER — KETOROLAC TROMETHAMINE 30 MG/ML IJ SOLN
30.0000 mg | Freq: Once | INTRAMUSCULAR | Status: AC
  Administered 2022-12-24: 30 mg via INTRAVENOUS
  Filled 2022-12-24: qty 1

## 2022-12-24 MED ORDER — PREDNISONE 50 MG PO TABS
60.0000 mg | ORAL_TABLET | Freq: Once | ORAL | Status: AC
  Administered 2022-12-24: 60 mg via ORAL
  Filled 2022-12-24: qty 1

## 2022-12-24 MED ORDER — HYDRALAZINE HCL 20 MG/ML IJ SOLN
2.0000 mg | Freq: Once | INTRAMUSCULAR | Status: AC
  Administered 2022-12-24: 2 mg via INTRAVENOUS
  Filled 2022-12-24: qty 1

## 2022-12-24 MED ORDER — ASPIRIN 81 MG PO CHEW
324.0000 mg | CHEWABLE_TABLET | Freq: Once | ORAL | Status: AC
  Administered 2022-12-24: 324 mg via ORAL
  Filled 2022-12-24: qty 4

## 2022-12-24 MED ORDER — PREDNISONE 10 MG PO TABS: 20.0000 mg | ORAL_TABLET | Freq: Every day | ORAL | 0 refills | Status: DC

## 2022-12-24 NOTE — ED Provider Notes (Signed)
[facility] Provider Note   CSN: [id number] Arrival date & time: 12/23/22  2337     History  Chief Complaint  Patient presents with   Hypertension    Kenneth Dollarhide Sr. is a 72 y.o. male.  The history is provided by the patient.  Hypertension This is a chronic problem. The current episode started more than 1 week ago. The problem occurs constantly. The problem has been gradually worsening. Pertinent negatives include no chest pain, no abdominal pain, no headaches and no shortness of breath. Nothing aggravates the symptoms. Nothing relieves the symptoms. Treatments tried: valsartan. The treatment provided no relief.  Patient with hypertension and chronic back and hip pain presents with elevated blood pressure this evening that he has been taking as a doctor started him on zanaflex which can lower your blood pressure.  Pressure was found to be high, asymptomatic but came in because it was elevated.      Past Medical History:  Diagnosis Date   Arthritis    "left thumb" (05/23/2017) right shoulder   Barrett's esophagus    "we've been told that it's all gone; still take RX for GERD" (05/23/2017)   Bilateral swelling of feet and ankles x 3 weeks as of 12-01-2019   CAD (coronary artery disease), native coronary artery    Coronary angiogram 05/03/2014:  Proximal LAD 3.0x12 mm Promus premier stent.  04/08/2014: Mid Cx 3.5 x 16 mm Promus DES.  03/29/2013: Mid LAD 2.75 x 38 mm Promus Premier drug-eluting stent, balloon angioplasty of D1 and distal LAD and stenting of distal RCA with 2.75 x 24 mm promos Premier drug-eluting stent 12/15/2012 widely patent.   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  04/08/2014: Mid Cx 3.5 x 16 mm Promus DES.  03/29/2013: Mid LAD 2.75 x 38 mm Promus Premier drug-eluting stent, balloon angioplasty of D1 and distal LAD and stenting of distal RCA with 2.75 x 24 mm promos Premier drug-eluting stent 12/15/2012 widely patent.     Home Medications Prior to Admission medications   Medication Sig Start Date End Date Taking? Authorizing Provider  acetaminophen (TYLENOL) 500 MG tablet Take 500 mg by mouth every 6 (six)  hours as needed for mild pain, moderate pain, headache or fever.    [provider]  albuterol (VENTOLIN HFA) 108 (90 Base) MCG/ACT inhaler TAKE 2 PUFFS BY MOUTH EVERY 6 HOURS AS NEEDED FOR WHEEZE OR SHORTNESS OF BREATH 11/26/22   [name], NP  aspirin EC 81 MG tablet Take 1 tablet (81 mg total) by mouth daily. Swallow whole. 04/11/21   Cantwell, Celeste C, PA-C  benzonatate (TESSALON PERLES) 100 MG capsule Take 2 capsules (200 mg total) by mouth 3 (three) times daily as needed for cough. 05/20/22   Fargo, Amy E, NP  Budeson-Glycopyrrol-Formoterol (BREZTRI AEROSPHERE) 160-9-4.8 MCG/ACT AERO Inhale 2 puffs into the lungs in the morning and at bedtime. 11/07/21   Luciano Cutter, MD  clopidogrel (PLAVIX) 75 MG tablet Take 1 tablet (75 mg total) by mouth daily. 06/19/22   [name], MD  Continuous Blood Gluc Sensor (FREESTYLE LIBRE 14 DAY SENSOR) MISC USE TO TEST BLOOD SUGAR THREE TIMES DAILY. E11.51. E11.59 08/15/21   [name], NP  diphenhydramine-acetaminophen (TYLENOL PM) 25-500 MG TABS tablet Take 1 tablet by mouth at bedtime as needed.    [provider]  fluticasone (FLONASE) 50 MCG/ACT nasal spray Place 1 spray into both nostrils daily as needed for allergies or rhinitis. 10/11/21   [name], NP  gabapentin (NEURONTIN) 100 MG capsule TAKE 3 CAPSULES (300 MG TOTAL) BY MOUTH 2 (TWO) TIMES DAILY. 10/18/22   Antony Madura, MD  glucose 4 GM chewable tablet Chew 1 tablet (4 g total) by mouth as needed for low blood sugar. 07/28/20   Fargo, Amy E, NP  hydrochlorothiazide (HYDRODIURIL) 25 MG tablet Take 1 tablet (25 mg total) by mouth daily. 11/06/22 02/04/23  [name], MD  insulin aspart (FIASP FLEXTOUCH) 100 UNIT/ML FlexTouch Pen Inject 15-25 Units into the skin 3 (three) times daily before meals. 12/06/22   Carlus Pavlov, MD  insulin aspart (NOVOLOG FLEXPEN) 100 UNIT/ML FlexPen INJECT 25-30 UNITS INTO THE SKIN SEE ADMIN INSTRUCTIONS. INJECT 30 UNITS SUBCUTANEOUSLY IN THE MORNING, INJECT 25 UNITS SUBCUTANEOUSLY WITH LUNCH & INJECT 25 UNITS SUBCUTANEOUSLY WITH DINNER. 09/18/22   Carlus Pavlov, MD  Insulin Glargine (BASAGLAR KWIKPEN) 100 UNIT/ML Inject 36 Units into the skin at bedtime. 12/06/22   Carlus Pavlov, MD  Insulin Pen Needle (BD PEN NEEDLE NANO 2ND GEN) 32G X 4 MM MISC USE AS DIRECTED 07/16/22   [name], NP  [facility] FINEPOINT LANCETS MISC Use to test for blood sugar three times daily dx: E11.51 06/01/18   [name], NP  metaxalone (SKELAXIN) 800 MG tablet Take 1 tablet (800 mg total) by mouth 3 (three) times daily. 12/09/22   Celie Desrochers, MD  metoprolol succinate (TOPROL-XL) 25 MG 24 hr tablet TAKE 1 TABLET BY  MOUTH EVERY DAY WITH OR IMMEDIATELY FOLLOWING A MEAL 06/04/22   [name], NP  nitroGLYCERIN (NITROSTAT) 0.4 MG SL tablet PLACE 1 TABLET UNDER THE TONGUE EVERY 5 MINUTES AS NEEDED FOR CHEST PAIN. 07/22/22   [name], NP  [facility] ULTRA test strip USE TO TEST FOR BLOOD SUGAR THREE TIMES DAILY DX: E11.51 12/04/18   Reed, Tiffany L, DO  pantoprazole (PROTONIX) 40 MG tablet TAKE ONE TABLET BY MOUTH ONCE DAILY FOR STOMACH 10/14/22   [name], NP  PRALUENT 150 MG/ML SOAJ INJECT 1 PEN INTO THE SKIN EVERY 14 (FOURTEEN) DAYS. 12/31/21   [name], MD  pramipexole (MIRAPEX) 0.5 MG tablet Take 0.5 mg by mouth  as needed.    [provider]  predniSONE (DELTASONE) 10 MG tablet Take 2 tablets (20 mg total) by mouth daily. 12/24/22   Correna Meacham, MD  valsartan (DIOVAN) 160 MG tablet TAKE 1 TABLET (160 MG TOTAL) BY MOUTH EVERY EVENING. 12/02/22   [name], MD      Allergies    Amlodipine, Lyrica [pregabalin], Other, Statins, Ativan [lorazepam], and Trulicity [dulaglutide]    Review of Systems   Review of Systems  HENT:  Negative for facial swelling.   Respiratory:  Negative for shortness of breath, wheezing and stridor.   Cardiovascular:  Negative for chest pain.  Gastrointestinal:  Negative for abdominal pain.  Musculoskeletal:  Positive for arthralgias and back pain.  Neurological:  Negative for headaches.  All other systems reviewed and are negative.   Physical Exam Updated Vital Signs BP (!) 197/93   Pulse (!) 57   Temp 98.7 F (37.1 C)   Resp 18   Ht 5\' 8"  (1.727 m)   Wt 92.5 kg   SpO2 97%   BMI 31.02 kg/m  Physical Exam Vitals and nursing note reviewed.  Constitutional:      General: He is not in acute distress.    Appearance: Normal appearance. He is well-developed. He is not diaphoretic.  HENT:     Head: Normocephalic and atraumatic.     Nose: Nose normal.  Eyes:     Conjunctiva/sclera: Conjunctivae normal.     Pupils: Pupils are equal,  round, and reactive to light.  Cardiovascular:     Rate and Rhythm: Normal rate and regular rhythm.     Pulses: Normal pulses.     Heart sounds: Normal heart sounds.  Pulmonary:     Effort: Pulmonary effort is normal.     Breath sounds: Normal breath sounds. No wheezing or rales.  Abdominal:     General: Bowel sounds are normal.     Palpations: Abdomen is soft.     Tenderness: There is no abdominal tenderness. There is no guarding or rebound.  Musculoskeletal:        General: Normal range of motion.     Cervical back: Normal range of motion and neck supple.  Skin:    General: Skin is warm and dry.     Capillary Refill: Capillary refill takes less than 2 seconds.  Neurological:     General: No focal deficit present.     Mental Status: He is alert and oriented to person, place, and time.     Deep Tendon Reflexes: Reflexes normal.  Psychiatric:        Mood and Affect: Mood normal.        Behavior: Behavior normal.     ED Results / Procedures / Treatments   Labs (all labs ordered are listed, but only abnormal results are displayed) Results for orders placed or performed during the hospital encounter of 12/24/22  Basic metabolic panel  Result Value Ref Range   Sodium 133 (L) 135 - 145 mmol/L   Potassium 4.6 3.5 - 5.1 mmol/L   Chloride 98 98 - 111 mmol/L   CO2 26 22 - 32 mmol/L   Glucose, Bld 139 (H) 70 - 99 mg/dL   BUN 39 (H) 8 - 23 mg/dL   Creatinine, Ser 3.66 (H) 0.61 - 1.24 mg/dL   Calcium 9.5 8.9 - 44.0 mg/dL   GFR, Estimated 45 (L) >60 mL/min   Anion gap 9 5 - 15  CBC  Result Value Ref Range  WBC 9.1 4.0 - 10.5 K/uL   RBC 5.19 4.22 - 5.81 MIL/uL   Hemoglobin 15.7 13.0 - 17.0 g/dL   HCT 57.8 46.9 - 62.9 %   MCV 89.8 80.0 - 100.0 fL   MCH 30.3 26.0 - 34.0 pg   MCHC 33.7 30.0 - 36.0 g/dL   RDW 52.8 41.3 - 24.4 %   Platelets 124 (L) 150 - 400 K/uL   nRBC 0.0 0.0 - 0.2 %  Troponin I (High Sensitivity)  Result Value Ref Range   Troponin I (High Sensitivity) 18 (H)  <18 ng/L  Troponin I (High Sensitivity)  Result Value Ref Range   Troponin I (High Sensitivity) 16 <18 ng/L   *Note: Due to a large number of results and/or encounters for the requested time period, some results have not been displayed. A complete set of results can be found in Results Review.   No results found.  EKG EKG Interpretation Date/Time:  Monday December 23 2022 23:50:32 EDT Ventricular Rate:  70 PR Interval:  131 QRS Duration:  100 QT Interval:  409 QTC Calculation: 442 R Axis:   -22  Text Interpretation: Sinus rhythm Borderline left axis deviation Low voltage, precordial leads Confirmed by Nicanor Alcon, Reda Gettis ([id number]) on 12/24/2022 3:39:48 AM  Radiology No results found.  Procedures Procedures    Medications Ordered in ED Medications  predniSONE (DELTASONE) tablet 60 mg (has no administration in time range)  ketorolac (TORADOL) 30 MG/ML injection 30 mg (30 mg Intravenous Given 12/24/22 0418)  aspirin chewable tablet 324 mg (324 mg Oral Given 12/24/22 0419)  hydrALAZINE (APRESOLINE) injection 2 mg (2 mg Intravenous Given 12/24/22 0502)    ED Course/ Medical Decision Making/ A&P                                 Medical Decision Making Patient with HTN and chronic back and hip pain started on zanaflex for his pain and was told BP could be low so took his BP.  No symptoms at this time   Amount and/or Complexity of Data Reviewed Independent Historian: spouse    Details: See above  External Data Reviewed: labs and notes.    Details: Previous notes reviewed creatinine reviewed  Labs: ordered.    Details: First troponin is 18 top normal second troponin decreased to 16.  Sodium 133, potassium normal 4.6, creatinine elevated 1.62. normal white count 9.1, normal hemoglobin 15.7, normal platelets.   ECG/medicine tests: ordered and independent interpretation performed. Decision-making details documented in ED Course.  Risk OTC drugs. Prescription drug management. Risk  Details: Patient is here for asymptomatic HTN.  Troponin top normal on first and then decreased and normal on second troponin.  Pain in back and hip is chronic but may be contributing to overall high BP.  Moreover, Valsartan is not a strong BP medication.  I have advised calling his cardiologist for medication adjustment.  BP improved in the ED. Patient will need to follow up with pain management regarding his back and hip pain.  Stable for discharge.  Strict return.      Final Clinical Impression(s) / ED Diagnoses Final diagnoses:  Primary hypertension   Return for intractable cough, coughing up blood, fevers > 100.4 unrelieved by medication, shortness of breath, intractable vomiting, chest pain, shortness of breath, weakness, numbness, changes in speech, facial asymmetry, abdominal pain, passing out, Inability to tolerate liquids or food, cough, altered mental status or any concerns.  No signs of systemic illness or infection. The patient is nontoxic-appearing on exam and vital signs are within normal limits.  I have reviewed the triage vital signs and the nursing notes. Pertinent labs & imaging results that were available during my care of the patient were reviewed by me and considered in my medical decision making (see chart for details). After history, exam, and medical workup I feel the patient has been appropriately medically screened and is safe for discharge home. Pertinent diagnoses were discussed with the patient. Patient was given return precautions Rx / DC Orders ED Discharge Orders          Ordered    predniSONE (DELTASONE) 10 MG tablet  Daily        12/24/22 0518              Kamdin Follett, MD 12/24/22 1610

## 2022-12-30 ENCOUNTER — Telehealth: Payer: Self-pay | Admitting: Cardiology

## 2022-12-30 DIAGNOSIS — M5126 Other intervertebral disc displacement, lumbar region: Secondary | ICD-10-CM | POA: Diagnosis not present

## 2022-12-30 MED ORDER — VALSARTAN-HYDROCHLOROTHIAZIDE 160-25 MG PO TABS: 1.0000 | ORAL_TABLET | Freq: Every day | ORAL | 3 refills | Status: DC

## 2022-12-30 NOTE — Telephone Encounter (Signed)
Pt c/o medication issue:  1. Name of Medication: hydrochlorothiazide (HYDRODIURIL) 25 MG tablet   valsartan (DIOVAN) 160 MG tablet   2. How are you currently taking this medication (dosage and times per day)?    3. Are you having a reaction (difficulty breathing--STAT)? no  4. What is your medication issue? Patient states that these two medication is suppose to be combine. Please advise

## 2022-12-30 NOTE — Telephone Encounter (Signed)
Patient states that Dr. [name] was going to combine his valsartan and hydrochlorothiazide into a combination tablet once he was close to running out of the valsartan. He states that he is almost out and would like to combination tablet sent in. He confirms he is taking valsartan 160 mg and hydrochlorothiazide 25 mg.

## 2022-12-31 ENCOUNTER — Other Ambulatory Visit: Payer: Self-pay | Admitting: Nurse Practitioner

## 2022-12-31 DIAGNOSIS — I25118 Atherosclerotic heart disease of native coronary artery with other forms of angina pectoris: Secondary | ICD-10-CM

## 2022-12-31 DIAGNOSIS — I10 Essential (primary) hypertension: Secondary | ICD-10-CM

## 2023-01-02 DIAGNOSIS — M5126 Other intervertebral disc displacement, lumbar region: Secondary | ICD-10-CM | POA: Diagnosis not present

## 2023-01-07 DIAGNOSIS — M5126 Other intervertebral disc displacement, lumbar region: Secondary | ICD-10-CM | POA: Diagnosis not present

## 2023-01-08 DIAGNOSIS — M9905 Segmental and somatic dysfunction of pelvic region: Secondary | ICD-10-CM | POA: Diagnosis not present

## 2023-01-08 DIAGNOSIS — M9902 Segmental and somatic dysfunction of thoracic region: Secondary | ICD-10-CM | POA: Diagnosis not present

## 2023-01-08 DIAGNOSIS — M5416 Radiculopathy, lumbar region: Secondary | ICD-10-CM | POA: Diagnosis not present

## 2023-01-08 DIAGNOSIS — M9903 Segmental and somatic dysfunction of lumbar region: Secondary | ICD-10-CM | POA: Diagnosis not present

## 2023-01-08 DIAGNOSIS — M5417 Radiculopathy, lumbosacral region: Secondary | ICD-10-CM | POA: Diagnosis not present

## 2023-01-09 ENCOUNTER — Other Ambulatory Visit: Payer: Self-pay | Admitting: Neurology

## 2023-01-09 ENCOUNTER — Telehealth: Payer: Self-pay | Admitting: Neurology

## 2023-01-09 DIAGNOSIS — G2581 Restless legs syndrome: Secondary | ICD-10-CM

## 2023-01-09 MED ORDER — GABAPENTIN 100 MG PO CAPS
300.0000 mg | ORAL_CAPSULE | Freq: Two times a day (BID) | ORAL | 5 refills | Status: DC
Start: 2023-01-09 — End: 2023-01-29

## 2023-01-09 NOTE — Telephone Encounter (Signed)
1. Which medications need to be refilled? (please list name of each medication and dose if known) gabapentin (NEURONTIN) 100 MG capsule [[id number]]   2. Which pharmacy/location (including street and city if local pharmacy) is medication to be sent to? [id number] - [place],  - [address]  3. Do they need a 30 day or 90 day supply?

## 2023-01-09 NOTE — Telephone Encounter (Signed)
Sent to Dr. Loleta Chance for approval

## 2023-01-13 DIAGNOSIS — M5126 Other intervertebral disc displacement, lumbar region: Secondary | ICD-10-CM | POA: Diagnosis not present

## 2023-01-20 ENCOUNTER — Ambulatory Visit: Payer: Medicare Other | Admitting: Nurse Practitioner

## 2023-01-20 DIAGNOSIS — M9905 Segmental and somatic dysfunction of pelvic region: Secondary | ICD-10-CM | POA: Diagnosis not present

## 2023-01-20 DIAGNOSIS — M9902 Segmental and somatic dysfunction of thoracic region: Secondary | ICD-10-CM | POA: Diagnosis not present

## 2023-01-20 DIAGNOSIS — M5417 Radiculopathy, lumbosacral region: Secondary | ICD-10-CM | POA: Diagnosis not present

## 2023-01-20 DIAGNOSIS — M9903 Segmental and somatic dysfunction of lumbar region: Secondary | ICD-10-CM | POA: Diagnosis not present

## 2023-01-20 DIAGNOSIS — M5416 Radiculopathy, lumbar region: Secondary | ICD-10-CM | POA: Diagnosis not present

## 2023-01-20 NOTE — Progress Notes (Signed)
NEUROLOGY FOLLOW UP OFFICE NOTE  Kenneth Mcwhorter Sr. [id number]  Subjective:  Kenneth Lehrke Sr. is a 72 y.o. year old male with a history of arthritis, GERD, COPD, CAD, HTN, HLD, DM2, iron deficiency anemia, TIA (01/14/21 - left face droop and blurry vision) who we last saw on 07/24/22 for restless leg syndrome, functional movement disorder, and B12 deficiency.  To briefly review: Patient started having restless legs syndrome. He describes needing to move his legs, then would improve after moving. It would happen worse at night and while in bed. He went untreated for many years. After his first stent in 2014, patient was put on Lipitor. He had such severe shaking, he required hospitalization and narcotics to stop his restless legs. He mentions having problems with his iron. He has taken iron supplementation and iron shots. He was not currently on any iron supplementation at initial visit on 10/22/21.   He has taken many different medications in the past, but is currently on gabapentin 200 mg at bedtime and pramipexole 1mg  at 5 pm, 1mg  at 7 pm, and 2mg  at bedtime (9-10 pm). Previously he did not take pramipexole later, but now requires it earlier and requires more medication. Symptoms continue to worsen though. He cannot sit down long or symptoms will get severe. He feels like he has to move to keep the symptoms at bay. His symptoms have spread throughout the body now.   Patient was previously seen for restless leg syndrome (in 2015) by Dr. Karel Jarvis. To briefly review, per history from clinic note on 09/29/13: "was admitted at [facility] last 09/18/2013 for leg pain due to severe RLS, found to have rhabdomyolysis with CK of 4907.  He was admitted for hydration and pain management.  He has had RLS for at least 20 years, initially affecting his legs, however since 2004 his arms have been involved as well.  He has been taking Mirapex for at least 10 years, and had been takin 0.75mg  dose 1-2 tablets at night.  This dose was  causing him drowsiness.  He saw his PCP on 07/14 due to worsening RLS symptoms. He was tried on Zanaflex and Mirapex was discontinued, which did not help.  He restarted Mirapex and had to take additional doses due to constant limb movements and pain, prompting him to go to the ER.  He was started on Gabapentin and clonazepam which did not help much, he was also given Dilaudid which quieted him down.  He was discharged home then returned to the ER due to persistently restless and painful legs bilaterally. He was described to be literally thrashing about the bed, able to answer questions but in significant distress. He tells me the symptoms would start in his right leg, leg would flex at the hip, then he stretches and leans back because the leg is jerking so violently for up to 1-1/2 hours. This eases off then starts on his left leg.  Walking and moving around helps.  Symptoms improved with pain medication. He saw his PCP a week ago and reported leg swelling, ?due to gabapentin. He was started on Sinemet 10/100mg  TID, which her reports has helped with the RLS symptoms, however wears off in the middle of the night. He would take 1/2 tab Mirapex when Sinemet wears off.  Since starting Sinemet, he reports weird dreams but no other side effects.  He had discontinued gabapentin, clonazepam, and Zanaflex.     He has infrequent headaches, dyspnea on exertion (climbing steps), occasional choking. Otherwise  he denies any diplopia, dysarthria, focal numbness/tingling, bowel/bladder dysfunction.  He has a history of C1 fracture at age 9 or 20. No known family history of similar RLS because he was adopted."   Patient has previously tried Mirapex and Sinemet as well as gabapentin, clonazepam, zanaflex, and Tramadol.   He endorses occasional discomfort in his legs and swelling of his legs.   He denies fevers, chills, or unexplained weight lost.   EtOH: none  Dietary restrictions: none Family hx of neurologic disorders:  patient is adopted and not sure   At 01/02/22 visit, patient had dyskinesias thought to be due to excess of pramipexole as he continued to take 3 mg nightly. He was instructed to decrease pramipexole. He described increasing movements continuing on 01/29/22 at which time patient was still taking up to 2 mg of pramipexole nightly. We again agreed to cut down on the pramipexole and take gabapentin instead of RLS symptoms.   He went to [facility] ED on 03/02/22, [facility] ED on 03/04/22, and [facility] again on 03/05/22 for the movements.   [facility] ED assessment/plan: [name] endorses a chronic longstanding history of restless leg syndrome which per history appears consistent with, also presents with years of abnormal uncontrollable movements that are worse at rest and specifically at nighttime. On history it appears the symptoms started sometime after being treated for restless leg syndrome with pramipexole. On exam there is no significant bradykinesia rigidity or postural action tremor. Abnormal movements involving his trunk and lower extremities are observed consistent and concerning for dyskinesia. Overall believe his presentation is consistent with likely a chronic history of restless leg syndrome and a secondary process of dyskinesia in the setting of chronic pramipexole use. Further workup and recommendations as below. - Stop taking pramipexole! - Continue home iron supplementations as tolerated - Continue Gabapentin, take 300 mg in AM, 300 mg at 12:00 PM (noon), 900 mg at night - They recommend follow up with [facility]. A referral has been placed, and you should be hearing from them.  - They recommend improving sleep hygiene with: Setting regular sleep hours, avoiding electronics in bed, practice visualization exercises if he cannot fall asleep, limit liquids prior to sleep, and eliminate all naps.    Patient mentioned that he can set his watch to his symptoms. He knows when he sits down to eat in the  evening, he is going to be shaking. He will take medication then. After his meal, his symptoms will get worse. He will take more medication. He will get worse throughout the night. He will have to keep moving, either on a treadmill or in his shop. This is help with symptoms. In the past, he would get drowsy then finally go to sleep, but not sleep all night. Wife mentions that these jerking movements have been present for years, but maybe worse now (since his 71s per wife). It has gotten worse over the last few months since our treatment plan began.    Wife provided a video from ED on 03/05/22 in which patient had intermittent quick jerking of all extremities, including in the head and neck. It was not rhythmic and without clear pattern. After getting permission from patient in writing (will be scanned into chart), I recorded the recording and patient's jerks today with office iPad.   04/10/22: Patient was having increasing RLS symptoms. He mentioned he was taking 1/2 tablet of pramipexole on 03/20/22, but not every night, and this was helping. I recommended he take  1/2 tablet of pramipexole nightly on 03/21/22 as movements I saw were no longer felt to be dyskinesias.    Overall, his symptoms are about the same. He has good days and bad days. He will go a couple of nights without sleeping well, and then will sleep well for a couple of days. Last night he walked from 7-9 pm. He slept at 9pm to 3am (waking to go to bathroom). He then slept until 5 am. That was a good night.   He is still attempting to get in touch with Celene Squibb to set up an appointment for FND, but has not yet been able to reach her.   He continues to take gabapentin 300 mg qhs and pramipexole 1/2 tablet (0.50 mg).    Patient went to sleep medicine who recommended a sleep study. He was told he had sleep apnea. He is unsure of follow up plans of now.  07/24/22: Patient was doing well for the last couple of months. He has not been able to  sleep over the last 4-5 nights due to shaking though.   He is taking pramipexole 0.25 mg and gabapentin 200 mg around 6 pm. That will help until 8:30pm (bedtime) when he will take pramipexole 0.25 mg and gabapentin 200 mg and a tylenol PM. This was working until the last few nights.   Patient had a sleep study that showed severe sleep apnea. Patient mentions that he thought he was supposed to hear back about CPAP, but has not.   Patient had mentioned a message therapist, but has not been. Patient is also still on the wait list for the [facility] therapist in [facility]).  Most recent Assessment and Plan (07/24/22): This is Kenneth Sic Sr., a 72 y.o. male with: Restless leg syndrome - complicated by iron deficiency, now normalized on iron supplementation. No evidence of parkinsonism on exam. 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 B12 deficiency - will restart supplementation   Plan: -Discuss CPAP with sleep medicine -Work with PCP on diabetes -Continue pramipexole 0.5 mg qhs -Continue gabapentin 400-600 mg qhs -Continue ferrous sulfate 325 mg daily for now -Therapy for FND with Celene Squibb - referral placed - patient is currently on the wait list to be seen -Continue B12 1000 mcg daily  Since their last visit: Patient is having right knee pain. He is seeing sports medicine and having an MRI soon. He has had a couple of falls due to the knee pain. He mentions that he has a couple of bulging disks in his low back that is also being followed by them.  Regarding his RLS, it fluctuates. He continues to have severe symptoms at time. Symptoms continue to be worse at night. He will take 300 mg of gabapentin and 0.5 mg of pramipexole around 5 pm. Around 8-8:30 pm, he will take another 300 mg of gabapentin and 0.5 mg to 1 mg of pramipexole. He will also take a tylenol PM to  help him sleep. He takes melatonin and magnesium as well. He ran out of iron supplement, so he has not been taking this for about 3 months. He is also no longer taking B12.  Patient has had the sleep study, but has not seen sleep medicine. He continues to stop breathing while sleeping per wife.  Celene Squibb has been in contact with patient, but she currently has no openings to  see patient.    MEDICATIONS:  Outpatient Encounter Medications as of 01/29/2023  Medication Sig   acetaminophen (TYLENOL) 500 MG tablet Take 500 mg by mouth every 6 (six) hours as needed for mild pain, moderate pain, headache or fever.   albuterol (VENTOLIN HFA) 108 (90 Base) MCG/ACT inhaler TAKE 2 PUFFS BY MOUTH EVERY 6 HOURS AS NEEDED FOR WHEEZE OR SHORTNESS OF BREATH   aspirin EC 81 MG tablet Take 1 tablet (81 mg total) by mouth daily. Swallow whole.   benzonatate (TESSALON PERLES) 100 MG capsule Take 2 capsules (200 mg total) by mouth 3 (three) times daily as needed for cough.   Budeson-Glycopyrrol-Formoterol (BREZTRI AEROSPHERE) 160-9-4.8 MCG/ACT AERO Inhale 2 puffs into the lungs in the morning and at bedtime.   clopidogrel (PLAVIX) 75 MG tablet Take 1 tablet (75 mg total) by mouth daily.   Continuous Blood Gluc Sensor (FREESTYLE LIBRE 14 DAY SENSOR) MISC USE TO TEST BLOOD SUGAR THREE TIMES DAILY. E11.51. E11.59   diphenhydramine-acetaminophen (TYLENOL PM) 25-500 MG TABS tablet Take 1 tablet by mouth at bedtime as needed.   fluticasone (FLONASE) 50 MCG/ACT nasal spray Place 1 spray into both nostrils daily as needed for allergies or rhinitis.   gabapentin (NEURONTIN) 100 MG capsule Take 3 capsules (300 mg total) by mouth 2 (two) times daily.   glucose 4 GM chewable tablet Chew 1 tablet (4 g total) by mouth as needed for low blood sugar.   insulin aspart (FIASP FLEXTOUCH) 100 UNIT/ML FlexTouch Pen Inject 15-25 Units into the skin 3 (three) times daily before meals.   insulin aspart (NOVOLOG FLEXPEN) 100 UNIT/ML  FlexPen INJECT 25-30 UNITS INTO THE SKIN SEE ADMIN INSTRUCTIONS. INJECT 30 UNITS SUBCUTANEOUSLY IN THE MORNING, INJECT 25 UNITS SUBCUTANEOUSLY WITH LUNCH & INJECT 25 UNITS SUBCUTANEOUSLY WITH DINNER.   Insulin Glargine (BASAGLAR KWIKPEN) 100 UNIT/ML Inject 36 Units into the skin at bedtime.   Insulin Pen Needle (BD PEN NEEDLE NANO 2ND GEN) 32G X 4 MM MISC USE AS DIRECTED   LIFESCAN FINEPOINT LANCETS MISC Use to test for blood sugar three times daily dx: E11.51   metoprolol succinate (TOPROL-XL) 25 MG 24 hr tablet TAKE 1 TABLET BY MOUTH EVERY DAY WITH OR IMMEDIATELY FOLLOWING A MEAL   nitroGLYCERIN (NITROSTAT) 0.4 MG SL tablet PLACE 1 TABLET UNDER THE TONGUE EVERY 5 MINUTES AS NEEDED FOR CHEST PAIN.   ONETOUCH ULTRA test strip USE TO TEST FOR BLOOD SUGAR THREE TIMES DAILY DX: E11.51   pantoprazole (PROTONIX) 40 MG tablet TAKE ONE TABLET BY MOUTH ONCE DAILY FOR STOMACH   PRALUENT 150 MG/ML SOAJ INJECT 1 PEN INTO THE SKIN EVERY 14 (FOURTEEN) DAYS.   pramipexole (MIRAPEX) 0.5 MG tablet Take 0.5 mg by mouth as needed.   tiZANidine (ZANAFLEX) 4 MG tablet Take 4 mg by mouth every 8 (eight) hours as needed.   valsartan-hydrochlorothiazide (DIOVAN HCT) 160-25 MG tablet Take 1 tablet by mouth daily.   metaxalone (SKELAXIN) 800 MG tablet Take 1 tablet (800 mg total) by mouth 3 (three) times daily. (Patient not taking: Reported on 01/29/2023)   predniSONE (DELTASONE) 10 MG tablet Take 2 tablets (20 mg total) by mouth daily. (Patient not taking: Reported on 01/29/2023)   Facility-Administered Encounter Medications as of 01/29/2023  Medication   [COMPLETED] hydrALAZINE (APRESOLINE) injection 10 mg    PAST MEDICAL HISTORY: Past Medical History:  Diagnosis Date   Arthritis    "left thumb" (05/23/2017) right shoulder   Barrett's esophagus    "we've been told that it's  all gone; still take RX for GERD" (05/23/2017)   Bilateral swelling of feet and ankles x 3 weeks as of 12-01-2019   CAD (coronary artery  disease), native coronary artery    Coronary angiogram 05/03/2014:  Proximal LAD 3.0x12 mm Promus premier stent.  04/08/2014: Mid Cx 3.5 x 16 mm Promus DES.  03/29/2013: Mid LAD 2.75 x 38 mm Promus Premier drug-eluting stent, balloon angioplasty of D1 and distal LAD and stenting of distal RCA with 2.75 x 24 mm promos Premier drug-eluting stent 12/15/2012 widely patent.   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  04/08/2014: Mid Cx 3.5 x 16 mm Promus DES.  03/29/2013: Mid LAD 2.75 x 38 mm Promus Premier drug-eluting stent, balloon angioplasty of D1 and distal LAD and stenting of distal RCA with 2.75 x 24 mm promos Premier drug-eluting stent 12/15/2012 widely patent.    PAST SURGICAL HISTORY: Past Surgical History:  Procedure Laterality Date   BICEPT TENODESIS Right 12/07/2019   Procedure: RIGHT BICEPS TENODESIS;  Surgeon: Sheral Apley, MD;  Location: [facility] SURGERY  [facility];  Service: Orthopedics;  Laterality: Right;   CARDIAC CATHETERIZATION N/A 09/25/2015   Procedure: Left Heart Cath and Coronary Angiography;  Surgeon: [name], MD;  Location: [facility] INVASIVE CV LAB;  Service: Cardiovascular;  Laterality: N/A;   CARDIAC CATHETERIZATION N/A 09/25/2015   Procedure: Intravascular Pressure Wire/FFR Study;  Surgeon: [name], MD;  Location: [facility] INVASIVE CV LAB;  Service: Cardiovascular;  Laterality: N/A;   CORONARY ANGIOPLASTY WITH STENT PLACEMENT  12/15/2012; 04/28/2014; 05/03/2014   "2; 1; 1" , 4 stents 2 balloons   CORONARY STENT INTERVENTION N/A 05/01/2021   Procedure: CORONARY STENT INTERVENTION;  Surgeon: [name], MD;  Location: [facility] INVASIVE CV LAB;  Service: Cardiovascular;  Laterality: N/A;   FRACTIONAL FLOW RESERVE WIRE Right 03/26/2013   Procedure: FRACTIONAL FLOW RESERVE WIRE;  Surgeon: Pamella Pert, MD;  Location: [facility] CATH LAB;  Service: Cardiovascular;  Laterality: Right;   FRACTIONAL FLOW RESERVE WIRE N/A 05/03/2014   Procedure: FRACTIONAL FLOW RESERVE WIRE;  Surgeon: Pamella Pert, MD;  Location: [facility] CATH LAB;  Service: Cardiovascular;  Laterality: N/A;   HERNIA REPAIR  2010   "umbilical"  INCISION AND DRAINAGE ABSCESS Left 05/2007   "groin"    KNEE SURGERY Right 1992;  2000   "calcium deposits removed" (12/15/2012)   LEFT HEART CATH AND CORONARY ANGIOGRAPHY N/A 05/23/2017   Procedure: LEFT HEART CATH AND CORONARY ANGIOGRAPHY;  Surgeon: [name], MD;  Location: [facility] INVASIVE CV LAB;  Service: Cardiovascular;  Laterality: N/A;   LEFT HEART CATH AND CORONARY ANGIOGRAPHY N/A 05/01/2021   Procedure: LEFT HEART CATH AND CORONARY ANGIOGRAPHY;  Surgeon: [name], MD;  Location: [facility] INVASIVE CV LAB;  Service: Cardiovascular;  Laterality: N/A;   LEFT HEART CATHETERIZATION WITH CORONARY ANGIOGRAM N/A 12/15/2012   Procedure: LEFT HEART CATHETERIZATION WITH CORONARY ANGIOGRAM;  Surgeon: Pamella Pert, MD;  Location: [facility] CATH LAB;  Service:  Cardiovascular;  Laterality: N/A;   LEFT HEART CATHETERIZATION WITH CORONARY ANGIOGRAM N/A 03/26/2013   Procedure: LEFT HEART CATHETERIZATION WITH CORONARY ANGIOGRAM;  Surgeon: Pamella Pert, MD;  Location: [facility] CATH LAB;  Service: Cardiovascular;  Laterality: N/A;   LEFT HEART CATHETERIZATION WITH CORONARY ANGIOGRAM N/A 04/08/2014   Procedure: LEFT HEART CATHETERIZATION WITH CORONARY ANGIOGRAM;  Surgeon: Micheline Chapman, MD;  Location: [facility] CATH LAB;  Service: Cardiovascular;  Laterality: N/A;   MOUTH SURGERY Right    PERCUTANEOUS CORONARY STENT INTERVENTION (PCI-S) N/A 05/03/2014   Procedure: PERCUTANEOUS CORONARY STENT INTERVENTION (PCI-S);  Surgeon: Pamella Pert, MD;  Location: [facility] CATH LAB;  Service: Cardiovascular;  Laterality: N/A;   RADIOLOGY WITH ANESTHESIA N/A 02/08/2021   Procedure: MRI WITH ANESTHESIA OF ABDOMEN WITH AND WITHOUT CONSTRAST;  Surgeon: Radiologist, Medication, MD;  Location: [facility] OR;  Service: Radiology;  Laterality: N/A;   ROTATOR CUFF REPAIR  2022   UMBILICAL HERNIA REPAIR  yrs ago    ALLERGIES: Allergies  Allergen Reactions   Amlodipine Other (See Comments)    Worsening restless legs, dyspnea and leg swelling   Lyrica [Pregabalin] Other (See Comments)    Made patient very lethargic the next morning, very hard to patient to function, move, etc.    Other Other (See Comments)    Convulsions, Causes Restless Legs, rhabdomyolysis   Statins Other (See Comments)    Causes Restless Legs, rhabdomyolysis   Ativan [Lorazepam] Other (See Comments)    Markedly increased restless legs   Trulicity [Dulaglutide] Other (See Comments)    GI side effects     FAMILY HISTORY: Family History  Adopted: Yes  Problem Relation Age of Onset   Emphysema Mother     SOCIAL HISTORY: Social History   Tobacco Use   Smoking status: Never   Smokeless tobacco: Never  Vaping Use   Vaping status: Never Used  Substance Use Topics   Alcohol use: No   Drug use: No   Social  History   Social History Narrative   Right handed    Caffeine 2 cups daily   Winter time 4 cups daily   Lives family one level home    Semi Retired      Objective:  Vital Signs:  BP (!) 170/78   Pulse 72   Ht 5\' 8"  (1.727 m)   Wt 200 lb (90.7 kg)   SpO2 97%   BMI 30.41 kg/m   General: No acute distress.  Patient appears well-groomed.   Head:  Normocephalic/atraumatic Neck: supple, no paraspinal tenderness, full range of motion Lungs: Non-labored breathing on room air   Neurological Exam: Mental status: alert and oriented, speech fluent and not dysarthric, language intact.  Cranial nerves: CN I: not tested CN II: pupils  equal, round and reactive to light, visual fields intact CN III, IV, VI:  full range of motion, no nystagmus, no ptosis CN V: facial sensation intact. CN VII: upper and lower face symmetric CN VIII: hearing intact CN IX, X: uvula midline CN XI: sternocleidomastoid and trapezius muscles intact CN XII: tongue midline  Bulk & Tone: normal, no fasciculations. Motor:  muscle strength 5/5 throughout Deep Tendon Reflexes:  2+ throughout.   Sensation:  Pinprick sensation intact. Finger to nose testing:  Without dysmetria.   Gait:  Narrow based, antalgic gait.  Labs and Imaging review: New results: CBC (12/24/22): significant for chronic thrombocytopenia (platelets 124) BMP (12/23/22): significant for glucose of 139, Cr 1.62, BUN 39 HbA1c (12/06/22): 8.4  Previously reviewed results: 07/22/22: Iron studies: normal (ferritin 173) HbA1c: 9.1   04/10/22:  Iron studies: normal (ferritin 119)   HbA1c (02/28/22): 7.8   10/22/21: Ferritin 21 B12: 248 Normal or unremarkable: Vit D CMP significant for elevated glucose (193) and BUN (28) CBC significant for platelets of 110 (chronic)   EMG (03/23/14): Normal study of RUE and RLE (no myopathy, radiculopathy, or peripheral neuropathy).   MRI brain, MRA head and neck (01/14/21): IMPRESSION: MRI HEAD:    Normal brain MRI for age. No acute intracranial infarct or other abnormality identified.   MRA HEAD:   Normal intracranial MRA. No large vessel occlusion or hemodynamically significant stenosis. No aneurysm.   MRA NECK:   1. 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 The distal right V4 segment is patent on corresponding MRA head portion of this exam with perfusion of the right PICA. 2. Widely patent dominant left vertebral artery. 3. Wide patency of both carotid artery systems within the neck.  Sleep study (04/08/22): IMPRESSIONS - Severe Obstructive Sleep apnea(OSA) Sub-Optimal pressure attained. - Electrocardiographic data showed presence of PVCs. - No Significant Central Sleep Apnea (CSA) - Moderate Oxygen Desaturation - The patient snored with moderate snoring volume. - EEG did not show alpha intrusion. - No significant periodic leg movements(PLMs) during sleep. However, no significant associated arousals. - Reduced sleep efficiency, normal primary sleep latency, long REM sleep latency and no slow wave latency. - Sleep efficiency was significantly reduced by multiple episodes of hypoglycemia and intervention, leg cramping, pain and discomfort,  Assessment/Plan:  This is Kenneth Sic Sr., a 72 y.o. male with: Restless leg syndrome - complicated by iron deficiency, normalized with iron supplementation on most recent testing. No evidence of parkinsonism on exam. 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 B12 deficiency - not currently on supplementation OSA - severe by sleep study earlier this year. Has not followed up with sleep medicine. This is likely contributing to RLS and overall quality of life  Plan: -Recheck iron, B12 -Continue pramipexole 0.5 mg qhs -Increase: Gabapentin 900 mg qhs -No on  iron, but pending ferritin results, may restart ferrous sulfate 325 mg daily -Therapy for FND with Celene Squibb - referral placed - patient is currently on the wait list to be seen - will try to reach out and reconnect as it has been ~1 year since he spoke with her -Referral to sleep medicine for severe OSA  Return to clinic in 6 months  Total time spent reviewing records, interview, history/exam, documentation, and coordination of care on day of encounter:  35 min  Jacquelyne Balint, MD

## 2023-01-21 DIAGNOSIS — M5416 Radiculopathy, lumbar region: Secondary | ICD-10-CM | POA: Diagnosis not present

## 2023-01-28 DIAGNOSIS — M5126 Other intervertebral disc displacement, lumbar region: Secondary | ICD-10-CM | POA: Diagnosis not present

## 2023-01-29 ENCOUNTER — Ambulatory Visit: Payer: Medicare Other | Attending: Cardiology | Admitting: Cardiology

## 2023-01-29 ENCOUNTER — Encounter: Payer: Self-pay | Admitting: Cardiology

## 2023-01-29 ENCOUNTER — Other Ambulatory Visit: Payer: Medicare Other

## 2023-01-29 ENCOUNTER — Ambulatory Visit (INDEPENDENT_AMBULATORY_CARE_PROVIDER_SITE_OTHER): Payer: Medicare Other | Admitting: Neurology

## 2023-01-29 ENCOUNTER — Encounter: Payer: Self-pay | Admitting: Neurology

## 2023-01-29 VITALS — BP 170/78 | HR 72 | Ht 68.0 in | Wt 200.0 lb

## 2023-01-29 VITALS — BP 156/90 | HR 72 | Ht 68.0 in | Wt 199.6 lb

## 2023-01-29 DIAGNOSIS — E559 Vitamin D deficiency, unspecified: Secondary | ICD-10-CM

## 2023-01-29 DIAGNOSIS — R55 Syncope and collapse: Secondary | ICD-10-CM | POA: Diagnosis not present

## 2023-01-29 DIAGNOSIS — E538 Deficiency of other specified B group vitamins: Secondary | ICD-10-CM

## 2023-01-29 DIAGNOSIS — G2581 Restless legs syndrome: Secondary | ICD-10-CM | POA: Diagnosis not present

## 2023-01-29 DIAGNOSIS — E611 Iron deficiency: Secondary | ICD-10-CM

## 2023-01-29 DIAGNOSIS — I10 Essential (primary) hypertension: Secondary | ICD-10-CM | POA: Diagnosis not present

## 2023-01-29 DIAGNOSIS — I25118 Atherosclerotic heart disease of native coronary artery with other forms of angina pectoris: Secondary | ICD-10-CM | POA: Diagnosis not present

## 2023-01-29 DIAGNOSIS — G259 Extrapyramidal and movement disorder, unspecified: Secondary | ICD-10-CM | POA: Diagnosis not present

## 2023-01-29 DIAGNOSIS — G4733 Obstructive sleep apnea (adult) (pediatric): Secondary | ICD-10-CM

## 2023-01-29 DIAGNOSIS — G2401 Drug induced subacute dyskinesia: Secondary | ICD-10-CM

## 2023-01-29 MED ORDER — GABAPENTIN 300 MG PO CAPS
900.0000 mg | ORAL_CAPSULE | Freq: Every day | ORAL | 6 refills | Status: DC
Start: 2023-01-29 — End: 2023-03-17

## 2023-01-29 MED ORDER — HYDRALAZINE HCL 50 MG PO TABS
50.0000 mg | ORAL_TABLET | Freq: Three times a day (TID) | ORAL | 6 refills | Status: DC
Start: 2023-01-29 — End: 2023-04-25

## 2023-01-29 MED ORDER — NITROGLYCERIN 0.4 MG SL SUBL: SUBLINGUAL_TABLET | SUBLINGUAL | 2 refills | Status: AC

## 2023-01-29 NOTE — Patient Instructions (Addendum)
I would like to recheck your iron and B12 today. I will be in touch with those results.  I am referring you to sleep medicine to discuss options for sleep apnea. Let me know if you do not hear from anyone to schedule in the next couple of weeks.  Try to reconnect with Celene Squibb: [phone] or [phone].  Continue pramipexole 0.5 mg at bedtime.  I am increasing your gabapentin to 900 mg at night. I have sent a new prescription to your pharmacy.  The physicians and staff at [facility] are committed to providing excellent care. You may receive a survey requesting feedback about your experience at our office. We strive to receive "very good" responses to the survey questions. If you feel that your experience would prevent you from giving the office a "very good " response, please contact our office to try to remedy the situation. We may be reached at [phone]. Thank you for taking the time out of your busy day to complete the survey.  Jacquelyne Balint, MD [facility]

## 2023-01-29 NOTE — Progress Notes (Unsigned)
Cardiology Office Note:  .   Date:  01/30/2023  ID:  Kenneth Sic Sr., DOB 25-Jul-1950, MRN [id number] PCP: [name], NP  [facility] Providers Cardiologist:  [name], MD   History of Present Illness: .   Kenneth Infante Sr. is a 72 y.o. Caucasian male with coronary artery disease, with stents to his LAD, RCA and circumflex, remote balloon angioplasty to distal LAD and diagonal has remained patent,  hypertension, hyperlipidemia, GERD, Barrett's esophagus, statin intolerance, uncontrolled diabetes mellitus, severe restless leg syndrome.  Discussed Kenneth use of AI scribe software for clinical note transcription with [name], who gave verbal consent to proceed.  History of Present Illness   [name], with a history of heart trouble and hypertension, presents for evaluation of near syncope episode that he had a week ago, after lunch he felt suddenly dizzy and felt like he was going to pass out and noted his blood pressure to be low and EMS was called in.  However King recovered over time and he was advised to make an appointment to see me.  Today he remains asymptomatic except complaints of back and knee pain that is significantly affecting his mobility. Kenneth knee pain is severe enough to warrant an upcoming MRI, and states that he has significant difficulty with walking and joint is swollen and painful with [name] suspecting a torn meniscus.   In addition to Kenneth musculoskeletal issues, [name] has been experiencing fluctuations in blood pressure. There have been instances of hypertension, with one episode reaching 218/119, prompting an ER visit. There have also been episodes of hypotension, with blood pressure dropping to Kenneth lower 90s or upper 80s. These episodes are often accompanied by difficulty breathing, similar to [name]'s past experiences with heart trouble. However, there are no other symptoms such as headache, nausea, or sweating during these  episodes.  [name]'s medications include nitroglycerin, aspirin, Plavix, and hydralazine.     Review of Systems  Cardiovascular:  Negative for chest pain, dyspnea on exertion and leg swelling.   Labs   Lab Results  Component Value Date   CHOL 150 01/21/2022   HDL 38 (L) 01/21/2022   LDLCALC 85 01/21/2022   LDLDIRECT 110 (H) 06/17/2019   TRIG 175 (H) 01/21/2022   CHOLHDL 3.9 01/21/2022   Lab Results  Component Value Date   NA 133 (L) 12/23/2022   K 4.6 12/23/2022   CO2 26 12/23/2022   GLUCOSE 139 (H) 12/23/2022   BUN 39 (H) 12/23/2022   CREATININE 1.62 (H) 12/23/2022   CALCIUM 9.5 12/23/2022   GFR 51.21 (L) 10/22/2021   EGFR 68 07/22/2022   GFRNONAA 45 (L) 12/23/2022      Latest Ref Rng & Units 12/23/2022   11:53 PM 07/22/2022    9:03 AM 03/05/2022    3:13 PM  BMP  Glucose 70 - 99 mg/dL 409  811  914   BUN 8 - 23 mg/dL 39  18  20   Creatinine 0.61 - 1.24 mg/dL 7.82  9.56  2.13   BUN/Creat Ratio 6 - 22 (calc)  SEE NOTE:    Sodium 135 - 145 mmol/L 133  141  137   Potassium 3.5 - 5.1 mmol/L 4.6  4.9  4.5   Chloride 98 - 111 mmol/L 98  104  103   CO2 22 - 32 mmol/L 26  29  26    Calcium 8.9 - 10.3 mg/dL 9.5  9.5  9.2  Latest Ref Rng & Units 12/24/2022    3:35 AM 07/22/2022    9:03 AM 03/05/2022    3:13 PM  CBC  WBC 4.0 - 10.5 K/uL 9.1  4.8  7.2   Hemoglobin 13.0 - 17.0 g/dL 96.0  45.4  09.8   Hematocrit 39.0 - 52.0 % 46.6  44.5  43.8   Platelets 150 - 400 K/uL 124  124  128     Physical Exam:   VS:  BP (!) 156/90   Pulse 72   Ht 5\' 8"  (1.727 m)   Wt 199 lb 9.6 oz (90.5 kg)   SpO2 98%   BMI 30.35 kg/m    Wt Readings from Last 3 Encounters:  01/29/23 199 lb 9.6 oz (90.5 kg)  01/29/23 200 lb (90.7 kg)  12/23/22 204 lb (92.5 kg)     Physical Exam Neck:     Vascular: No carotid bruit or JVD.  Cardiovascular:     Rate and Rhythm: Normal rate and regular rhythm.     Pulses:          Popliteal pulses are 0 on Kenneth right side and 0 on Kenneth left side.        Dorsalis pedis pulses are 0 on Kenneth right side and 0 on Kenneth left side.     Heart sounds: Murmur heard.     Midsystolic murmur is present with a grade of 2/6 at Kenneth apex.     No gallop.  Pulmonary:     Effort: Pulmonary effort is normal.     Breath sounds: Normal breath sounds.  Abdominal:     General: Bowel sounds are normal.     Palpations: Abdomen is soft.  Musculoskeletal:     Right lower leg: No edema.     Left lower leg: No edema.     Studies Reviewed: Marland Kitchen    Left Heart Catheterization 05/01/21:  LV 135/8, EDP 18 mmHg.  Ao 142/72, mean 104 mmHg.  No pressure gradient across Kenneth aortic valve. Left main: Not present LAD: Moderate to large vessel in Kenneth proximal segment however it is diffusely diseased.  Proximal 3.0 x 12 mm Promus DES placed 05/03/2014 widely patent.  Mid LAD 2.75 x 38 mm Promus Premier DES, balloon angioplasty of D1 and distal LAD performed 03/29/2013 is widely patent. CX: Large vessel, ostial circumflex 30 to 40% stenosis unchanged from prior catheterization, a 3.5 x 16 mm Promus DES placed 04/08/2014 is widely patent. RCA: Large vessel, mid segment has a diffuse long segment 99% stenosis.  Previously placed 2.74 x 24 mm Promus Premier DES on 12/15/2012 is widely patent.   Interventional data: Successful PTCA and stenting of Kenneth mid and distal right coronary artery with 2 overlapping 2.5 x 38 mm and a 2.5 x 18 mm Onyx frontier DES, distal end of Kenneth stent overlapped 2.75 x 24 mm Promus Premier DES placed 12/15/2012.  TIMI II to TIMI-3 flow at Kenneth end of Kenneth procedure.      04/08/2014 (Circumflex) : 3.5 x 16 mm Promus DES   05/03/2014 (LAD):  Proximal 3.0 x 12 mm Promus DES placed 05/03/2014.  Mid LAD 2.75 x 38 mm Promus Premier DES   05/01/2021 (RCA): 2.5 x 38 mm and a 2.5 x 18 mm Onyx frontier DES, distal end of Kenneth stent overlapped to 2.75 x 24 mm Promus Premier DES placed 12/15/2012  Echocardiogram 07/11/2021: Left ventricle cavity is normal in size and wall  thickness. Normal global wall motion. Normal  LV systolic function with EF 58%. Doppler evidence of grade I (impaired) diastolic dysfunction, normal LAP. Structurally normal trileaflet aortic valve.  Mild (Grade I) aortic regurgitation. Normal right atrial pressure. Compared to previous study on 01/04/2021, MR not seen. No other significant change noted.  EKG:   EKG 11/06/2022: Normal sinus rhythm at rate of 60 bpm, left anterior fascicular block. LVH. Compared to 02/08/2022, no significant change. Previously baseline artifact.   Medications and allergies    Allergies  Allergen Reactions   Amlodipine Other (See Comments)    Worsening restless legs, dyspnea and leg swelling   Lyrica [Pregabalin] Other (See Comments)    Made King very lethargic Kenneth next morning, very hard to King to function, move, etc.    Other Other (See Comments)    Convulsions, Causes Restless Legs, rhabdomyolysis   Statins Other (See Comments)    Causes Restless Legs, rhabdomyolysis   Ativan [Lorazepam] Other (See Comments)    Markedly increased restless legs   Trulicity [Dulaglutide] Other (See Comments)    GI side effects      Current Outpatient Medications:    acetaminophen (TYLENOL) 500 MG tablet, Take 500 mg by mouth every 6 (six) hours as needed for mild pain, moderate pain, headache or fever., Disp: , Rfl:    albuterol (VENTOLIN HFA) 108 (90 Base) MCG/ACT inhaler, TAKE 2 PUFFS BY MOUTH EVERY 6 HOURS AS NEEDED FOR WHEEZE OR SHORTNESS OF BREATH, Disp: 6.7 each, Rfl: 2   aspirin EC 81 MG tablet, Take 1 tablet (81 mg total) by mouth daily. Swallow whole., Disp: 90 tablet, Rfl: 3   benzonatate (TESSALON PERLES) 100 MG capsule, Take 2 capsules (200 mg total) by mouth 3 (three) times daily as needed for cough., Disp: 30 capsule, Rfl: 0   Budeson-Glycopyrrol-Formoterol (BREZTRI AEROSPHERE) 160-9-4.8 MCG/ACT AERO, Inhale 2 puffs into Kenneth lungs in Kenneth morning and at bedtime. (King taking differently: Inhale 2  puffs into Kenneth lungs as needed.), Disp: 5.9 g, Rfl: 0   Continuous Blood Gluc Sensor (FREESTYLE LIBRE 14 DAY SENSOR) MISC, USE TO TEST BLOOD SUGAR THREE TIMES DAILY. E11.51. E11.59, Disp: 1 each, Rfl: 12   diphenhydramine-acetaminophen (TYLENOL PM) 25-500 MG TABS tablet, Take 1 tablet by mouth at bedtime as needed., Disp: , Rfl:    fluticasone (FLONASE) 50 MCG/ACT nasal spray, Place 1 spray into both nostrils daily as needed for allergies or rhinitis., Disp: 16 g, Rfl: 11   gabapentin (NEURONTIN) 300 MG capsule, Take 3 capsules (900 mg total) by mouth at bedtime., Disp: 90 capsule, Rfl: 6   glucose 4 GM chewable tablet, Chew 1 tablet (4 g total) by mouth as needed for low blood sugar., Disp: 50 tablet, Rfl: 12   hydrALAZINE (APRESOLINE) 50 MG tablet, Take 1 tablet (50 mg total) by mouth 3 (three) times daily. Can hold Kenneth medicine if blood pressure is low <110 mmHg, Disp: 90 tablet, Rfl: 6   insulin aspart (FIASP FLEXTOUCH) 100 UNIT/ML FlexTouch Pen, Inject 15-25 Units into Kenneth skin 3 (three) times daily before meals., Disp: 45 mL, Rfl: 3   insulin aspart (NOVOLOG FLEXPEN) 100 UNIT/ML FlexPen, INJECT 25-30 UNITS INTO Kenneth SKIN SEE ADMIN INSTRUCTIONS. INJECT 30 UNITS SUBCUTANEOUSLY IN Kenneth MORNING, INJECT 25 UNITS SUBCUTANEOUSLY WITH LUNCH & INJECT 25 UNITS SUBCUTANEOUSLY WITH DINNER., Disp: 30 mL, Rfl: 1   Insulin Glargine (BASAGLAR KWIKPEN) 100 UNIT/ML, Inject 36 Units into Kenneth skin at bedtime., Disp: 30 mL, Rfl: 3   Insulin Pen Needle (BD PEN NEEDLE NANO 2ND GEN)  32G X 4 MM MISC, USE AS DIRECTED, Disp: 100 each, Rfl: 3   LIFESCAN FINEPOINT LANCETS MISC, Use to test for blood sugar three times daily dx: E11.51, Disp: 300 each, Rfl: 1   metoprolol succinate (TOPROL-XL) 25 MG 24 hr tablet, TAKE 1 TABLET BY MOUTH EVERY DAY WITH OR IMMEDIATELY FOLLOWING A MEAL, Disp: 90 tablet, Rfl: 1   ONETOUCH ULTRA test strip, USE TO TEST FOR BLOOD SUGAR THREE TIMES DAILY DX: E11.51, Disp: 300 strip, Rfl: 11    pantoprazole (PROTONIX) 40 MG tablet, TAKE ONE TABLET BY MOUTH ONCE DAILY FOR STOMACH, Disp: 90 tablet, Rfl: 3   PRALUENT 150 MG/ML SOAJ, INJECT 1 PEN INTO Kenneth SKIN EVERY 14 (FOURTEEN) DAYS., Disp: 6 mL, Rfl: 1   pramipexole (MIRAPEX) 0.5 MG tablet, Take 0.5 mg by mouth as needed., Disp: , Rfl:    tiZANidine (ZANAFLEX) 4 MG tablet, Take 4 mg by mouth every 8 (eight) hours as needed., Disp: , Rfl:    valsartan-hydrochlorothiazide (DIOVAN HCT) 160-25 MG tablet, Take 1 tablet by mouth daily., Disp: 90 tablet, Rfl: 3   nitroGLYCERIN (NITROSTAT) 0.4 MG SL tablet, PLACE 1 TABLET UNDER Kenneth TONGUE EVERY 5 MINUTES AS NEEDED FOR CHEST PAIN., Disp: 50 tablet, Rfl: 2 No current facility-administered medications for this visit.  Facility-Administered Medications Ordered in Other Visits:    [COMPLETED] hydrALAZINE (APRESOLINE) injection 10 mg, 10 mg, Intravenous, Once, [name], MD, 10 mg at 05/01/21 1054   ASSESSMENT AND PLAN: .      ICD-10-CM   1. Vasovagal episode  R55     2. Coronary artery disease involving native coronary artery of native heart with other form of angina pectoris (HCC)  I25.118 nitroGLYCERIN (NITROSTAT) 0.4 MG SL tablet    3. Essential hypertension  I10 hydrALAZINE (APRESOLINE) 50 MG tablet     Assessment and Plan    Back Pain and Knee Pain Severe pain leading to increased blood pressure. MRI scheduled to further evaluate knee pain. -Continue current pain management plan. -Complete MRI for further evaluation.  Hypertension Blood pressure fluctuating, likely secondary to pain. Previously well-controlled. -Start Hydralazine as needed, up to three times a day if blood pressure is above 110 mmHg. -Check blood pressure regularly and adjust Hydralazine dosage as needed.  Vasovagal Episode Recent episode of sudden drop in blood pressure, likely a vasovagal episode. No recurrence since Kenneth episode a week ago. -If another episode occurs, perform counter pressure maneuvers and lay  down with elevated legs.  Medication Adjustment King has completed one year course of dual antiplatelet therapy (Aspirin and Plavix) for coronary stents to RCA in February 2023.  Remains angina free. -Discontinue Plavix, continue Aspirin only.  General Health Maintenance -Next appointment in six months. -Call office if any issues arise before then.     Signed,  [name], MD, [facility] 01/30/2023, 7:37 AM [facility] [address] [place], [place] [address] Phone: [phone]. Fax:  [phone]

## 2023-01-29 NOTE — Patient Instructions (Signed)
Medication Instructions:  Your physician has recommended you make the following change in your medication:  Stop Clopidogrel Start hydralazine 50 mg by mouth three times daily   *If you need a refill on your cardiac medications before your next appointment, please call your pharmacy*   Lab Work: none If you have labs (blood work) drawn today and your tests are completely normal, you will receive your results only by: MyChart Message (if you have MyChart) OR A paper copy in the mail If you have any lab test that is abnormal or we need to change your treatment, we will call you to review the results.   Testing/Procedures: none   Follow-Up: At [facility], you and your health needs are our priority.  As part of our continuing mission to provide you with exceptional heart care, we have created designated Provider Care Teams.  These Care Teams include your primary Cardiologist (physician) and Advanced Practice Providers (APPs -  Physician Assistants and Nurse Practitioners) who all work together to provide you with the care you need, when you need it.  We recommend signing up for the patient portal called "MyChart".  Sign up information is provided on this After Visit Summary.  MyChart is used to connect with patients for Virtual Visits (Telemedicine).  Patients are able to view lab/test results, encounter notes, upcoming appointments, etc.  Non-urgent messages can be sent to your provider as well.   To learn more about what you can do with MyChart, go to [email or web address].    Your next appointment:   6 month(s)  Provider:   [name], MD     Other Instructions

## 2023-01-30 LAB — IRON,TIBC AND FERRITIN PANEL
%SAT: 42 % (ref 20–48)
Ferritin: 235 ng/mL (ref 24–380)
Iron: 119 ug/dL (ref 50–180)
TIBC: 281 ug/dL (ref 250–425)

## 2023-01-30 LAB — VITAMIN B12: Vitamin B-12: 384 pg/mL (ref 200–1100)

## 2023-01-31 DIAGNOSIS — M25561 Pain in right knee: Secondary | ICD-10-CM | POA: Diagnosis not present

## 2023-02-03 ENCOUNTER — Encounter (INDEPENDENT_AMBULATORY_CARE_PROVIDER_SITE_OTHER): Payer: Medicare Other | Admitting: Nurse Practitioner

## 2023-02-03 NOTE — Progress Notes (Signed)
This encounter was created in error - please disregard.

## 2023-02-04 DIAGNOSIS — M5126 Other intervertebral disc displacement, lumbar region: Secondary | ICD-10-CM | POA: Diagnosis not present

## 2023-02-18 DIAGNOSIS — M5126 Other intervertebral disc displacement, lumbar region: Secondary | ICD-10-CM | POA: Diagnosis not present

## 2023-03-06 ENCOUNTER — Ambulatory Visit: Payer: Medicare Other | Admitting: Internal Medicine

## 2023-03-07 DIAGNOSIS — M5416 Radiculopathy, lumbar region: Secondary | ICD-10-CM | POA: Diagnosis not present

## 2023-03-10 ENCOUNTER — Ambulatory Visit: Payer: Medicare Other | Admitting: Nurse Practitioner

## 2023-03-17 ENCOUNTER — Encounter: Payer: Self-pay | Admitting: Nurse Practitioner

## 2023-03-17 ENCOUNTER — Ambulatory Visit (INDEPENDENT_AMBULATORY_CARE_PROVIDER_SITE_OTHER): Payer: Medicare Other | Admitting: Nurse Practitioner

## 2023-03-17 ENCOUNTER — Encounter (HOSPITAL_BASED_OUTPATIENT_CLINIC_OR_DEPARTMENT_OTHER): Payer: Self-pay | Admitting: Emergency Medicine

## 2023-03-17 ENCOUNTER — Emergency Department (HOSPITAL_BASED_OUTPATIENT_CLINIC_OR_DEPARTMENT_OTHER)
Admission: EM | Admit: 2023-03-17 | Discharge: 2023-03-17 | Disposition: A | Discharge status: Home / Self Care | Payer: Medicare Other | Attending: Emergency Medicine | Admitting: Emergency Medicine

## 2023-03-17 ENCOUNTER — Emergency Department (HOSPITAL_BASED_OUTPATIENT_CLINIC_OR_DEPARTMENT_OTHER): Payer: Medicare Other

## 2023-03-17 ENCOUNTER — Other Ambulatory Visit: Payer: Self-pay

## 2023-03-17 VITALS — BP 136/72 | HR 78 | Temp 97.5°F | Ht 68.0 in | Wt 203.0 lb

## 2023-03-17 DIAGNOSIS — I10 Essential (primary) hypertension: Secondary | ICD-10-CM

## 2023-03-17 DIAGNOSIS — G2581 Restless legs syndrome: Secondary | ICD-10-CM

## 2023-03-17 DIAGNOSIS — E119 Type 2 diabetes mellitus without complications: Secondary | ICD-10-CM | POA: Insufficient documentation

## 2023-03-17 DIAGNOSIS — J069 Acute upper respiratory infection, unspecified: Secondary | ICD-10-CM | POA: Diagnosis not present

## 2023-03-17 DIAGNOSIS — Z79899 Other long term (current) drug therapy: Secondary | ICD-10-CM | POA: Insufficient documentation

## 2023-03-17 DIAGNOSIS — Z794 Long term (current) use of insulin: Secondary | ICD-10-CM

## 2023-03-17 DIAGNOSIS — W010XXA Fall on same level from slipping, tripping and stumbling without subsequent striking against object, initial encounter: Secondary | ICD-10-CM | POA: Insufficient documentation

## 2023-03-17 DIAGNOSIS — M25461 Effusion, right knee: Secondary | ICD-10-CM | POA: Insufficient documentation

## 2023-03-17 DIAGNOSIS — E1165 Type 2 diabetes mellitus with hyperglycemia: Secondary | ICD-10-CM

## 2023-03-17 DIAGNOSIS — E7800 Pure hypercholesterolemia, unspecified: Secondary | ICD-10-CM | POA: Diagnosis not present

## 2023-03-17 DIAGNOSIS — I2510 Atherosclerotic heart disease of native coronary artery without angina pectoris: Secondary | ICD-10-CM | POA: Insufficient documentation

## 2023-03-17 DIAGNOSIS — I25118 Atherosclerotic heart disease of native coronary artery with other forms of angina pectoris: Secondary | ICD-10-CM | POA: Diagnosis not present

## 2023-03-17 DIAGNOSIS — M25561 Pain in right knee: Secondary | ICD-10-CM

## 2023-03-17 DIAGNOSIS — D509 Iron deficiency anemia, unspecified: Secondary | ICD-10-CM

## 2023-03-17 DIAGNOSIS — W19XXXA Unspecified fall, initial encounter: Secondary | ICD-10-CM

## 2023-03-17 DIAGNOSIS — M25552 Pain in left hip: Secondary | ICD-10-CM | POA: Diagnosis not present

## 2023-03-17 DIAGNOSIS — Z7982 Long term (current) use of aspirin: Secondary | ICD-10-CM | POA: Insufficient documentation

## 2023-03-17 LAB — POC COVID19 BINAXNOW: SARS Coronavirus 2 Ag: NEGATIVE

## 2023-03-17 MED ORDER — TIZANIDINE HCL 4 MG PO TABS: 4.0000 mg | ORAL_TABLET | Freq: Three times a day (TID) | ORAL | 11 refills | Status: DC | PRN

## 2023-03-17 MED ORDER — GABAPENTIN 300 MG PO CAPS: 900.0000 mg | ORAL_CAPSULE | Freq: Every day | ORAL | 0 refills | Status: DC

## 2023-03-17 NOTE — Progress Notes (Signed)
 Careteam: Patient Care Team: Caro Harlene POUR, NP as PCP - General (Geriatric Medicine) Ladona Heinz, MD as PCP - Cardiology (Cardiology) Ladona Heinz, MD as Consulting Physician (Cardiology) Georjean Darice HERO, MD as Consulting Physician (Neurology) Oman, [name], OD (Optometry) Carlie Clark, MD as Consulting Physician (Otolaryngology)  PLACE OF SERVICE:  [facility]  Advanced Directive information Does Patient Have a Medical Advance Directive?: No, Would patient like information on creating a medical advance directive?: Yes (MAU/Ambulatory/Procedural Areas - Information given) (Has paperwork at home)  Allergies  Allergen Reactions   Amlodipine  Other (See Comments)    Worsening restless legs, dyspnea and leg swelling   Lyrica  [Pregabalin ] Other (See Comments)    Made patient very lethargic the next morning, very hard to patient to function, move, etc.    Other Other (See Comments)    Convulsions, Causes Restless Legs, rhabdomyolysis   Statins Other (See Comments)    Causes Restless Legs, rhabdomyolysis   Ativan  [Lorazepam ] Other (See Comments)    Markedly increased restless legs   Trulicity  [Dulaglutide ] Other (See Comments)    GI side effects     Chief Complaint  Patient presents with   Medical Management of Chronic Issues    6 month follow-up. Productive cough (yellow) and runny nose x 4 day, denies fever or sore throat. Patient has not taken at home covid test. Discuss in detail HCPOA/LW process. Discuss Bertrizi listing, patient is taking as needed verse scheduled. Discuss Gabapentin  dose and instructions. Refills requested pended.      HPI: Patient is a 73 y.o. male for routine follow up.  Reports he has chest congestion, cough and yellowish sputum.  Lost his voice this morning.  He is taking mucinex  and tessalon  pearls.   He is on using breztri  only as needed Reports he is having some shortness of breath   DM- followed by endocrinologist, takes A1c in office.  4  Had to have another steroid injection in back-ongoing follow up with orthopedics.  Has appt 24th this month, may need more therapy  Last injection was a week ago- said it can take up to 10 days to take effect but he has not had any improvement.   RLS- he has been taking 2 gabapentin , will take additional dose if bad  No ongoing chest pains- sees cardiologist routinely, took him off plavix  since he had been on a year. Continues on baby ASA Review of Systems:  Review of Systems  Constitutional:  Negative for chills, fever and weight loss.  HENT:  Negative for tinnitus.   Respiratory:  Positive for cough. Negative for sputum production and shortness of breath.   Cardiovascular:  Negative for chest pain, palpitations and leg swelling.  Gastrointestinal:  Negative for abdominal pain, constipation, diarrhea and heartburn.  Genitourinary:  Negative for dysuria, frequency and urgency.  Musculoskeletal:  Positive for back pain. Negative for falls, joint pain and myalgias.  Skin: Negative.   Neurological:  Negative for dizziness and headaches.  Psychiatric/Behavioral:  Negative for depression and memory loss. The patient does not have insomnia.     Past Medical History:  Diagnosis Date   Arthritis    left thumb (05/23/2017) right shoulder   Barrett's esophagus    we've been told that it's all gone; still take RX for GERD (05/23/2017)   Bilateral swelling of feet and ankles x 3 weeks as of 12-01-2019   CAD (coronary artery disease), native coronary artery    Coronary angiogram 05/03/2014:  Proximal LAD 3.0x12 mm  Promus premier stent.  04/08/2014: Mid Cx 3.5 x 16 mm Promus DES.  03/29/2013: Mid LAD 2.75 x 38 mm Promus Premier drug-eluting stent, balloon angioplasty of D1 and distal LAD and stenting of distal RCA with 2.75 x 24 mm promos Premier drug-eluting stent 12/15/2012 widely patent.   Cervicalgia    Chronic bronchitis (HCC)    q yr (05/23/2017)   Complication of anesthesia     I do not  wake up very well   slow to wake up   COVID-19 05/2019   all covid symptoms in hospital x 5 days, all symptoms resolved took monoclonal antibody tx    Dyspnea    with exertion   Family history of adverse reaction to anesthesia    son w/PONV   GERD (gastroesophageal reflux disease)    Heart murmur    grew out of it    Hyperlipidemia    Hypertension    Hypoglycemia, unspecified    IDDM (insulin  dependent diabetes mellitus)    type 2   Impotence of organic origin    Iron  deficiency anemia    Memory loss    mild   Other malaise and fatigue    Pneumonia 05/2019   PONV (postoperative nausea and vomiting)    after wisdom teeth pulled, no problems with other surgeries   Restless leg    Rotator cuff arthropathy    Stroke (HCC)    Tension headache    sometimes (05/23/2017)   Unspecified gastritis and gastroduodenitis with hemorrhage    Unstable angina pectoris (HCC) 04/08/2014   Coronary angiogram 05/03/2014:  Proximal LAD 3.0x12 mm Promus premier stent.  04/08/2014: Mid Cx 3.5 x 16 mm Promus DES.  03/29/2013: Mid LAD 2.75 x 38 mm Promus Premier drug-eluting stent, balloon angioplasty of D1 and distal LAD and stenting of distal RCA with 2.75 x 24 mm promos Premier drug-eluting stent 12/15/2012 widely patent.   Past Surgical History:  Procedure Laterality Date   BICEPT TENODESIS Right 12/07/2019   Procedure: RIGHT BICEPS TENODESIS;  Surgeon: Beverley Evalene BIRCH, MD;  Location: [facility];  Service: Orthopedics;  Laterality: Right;   CARDIAC CATHETERIZATION N/A 09/25/2015   Procedure: Left Heart Cath and Coronary Angiography;  Surgeon: Gordy Bergamo, MD;  Location: [facility] INVASIVE CV LAB;  Service: Cardiovascular;  Laterality: N/A;   CARDIAC CATHETERIZATION N/A 09/25/2015   Procedure: Intravascular Pressure Wire/FFR Study;  Surgeon: Gordy Bergamo, MD;  Location: [facility] INVASIVE CV LAB;  Service: Cardiovascular;  Laterality: N/A;   CORONARY ANGIOPLASTY WITH STENT PLACEMENT  12/15/2012;  04/28/2014; 05/03/2014   2; 1; 1 , 4 stents 2 balloons   CORONARY STENT INTERVENTION N/A 05/01/2021   Procedure: CORONARY STENT INTERVENTION;  Surgeon: Bergamo Gordy, MD;  Location: [facility] INVASIVE CV LAB;  Service: Cardiovascular;  Laterality: N/A;   FRACTIONAL FLOW RESERVE WIRE Right 03/26/2013   Procedure: FRACTIONAL FLOW RESERVE WIRE;  Surgeon: Erick JONELLE Bergamo, MD;  Location: [facility] CATH LAB;  Service: Cardiovascular;  Laterality: Right;   FRACTIONAL FLOW RESERVE WIRE N/A 05/03/2014   Procedure: FRACTIONAL FLOW RESERVE WIRE;  Surgeon: Erick JONELLE Bergamo, MD;  Location: [facility] CATH LAB;  Service: Cardiovascular;  Laterality: N/A;   HERNIA REPAIR  2010   umbilical    INCISION AND DRAINAGE ABSCESS Left 05/2007   groin    KNEE SURGERY Right 1992;  2000   calcium  deposits removed (12/15/2012)   LEFT HEART CATH AND CORONARY ANGIOGRAPHY N/A 05/23/2017   Procedure: LEFT HEART CATH AND CORONARY ANGIOGRAPHY;  Surgeon:  Ladona Heinz, MD;  Location: [facility] INVASIVE CV LAB;  Service: Cardiovascular;  Laterality: N/A;   LEFT HEART CATH AND CORONARY ANGIOGRAPHY N/A 05/01/2021   Procedure: LEFT HEART CATH AND CORONARY ANGIOGRAPHY;  Surgeon: Ladona Heinz, MD;  Location: [facility] INVASIVE CV LAB;  Service: Cardiovascular;  Laterality: N/A;   LEFT HEART CATHETERIZATION WITH CORONARY ANGIOGRAM N/A 12/15/2012   Procedure: LEFT HEART CATHETERIZATION WITH CORONARY ANGIOGRAM;  Surgeon: Erick JONELLE Ladona, MD;  Location: [facility] CATH LAB;  Service: Cardiovascular;  Laterality: N/A;   LEFT HEART CATHETERIZATION WITH CORONARY ANGIOGRAM N/A 03/26/2013   Procedure: LEFT HEART CATHETERIZATION WITH CORONARY ANGIOGRAM;  Surgeon: Erick JONELLE Ladona, MD;  Location: [facility] CATH LAB;  Service: Cardiovascular;  Laterality: N/A;   LEFT HEART CATHETERIZATION WITH CORONARY ANGIOGRAM N/A 04/08/2014   Procedure: LEFT HEART CATHETERIZATION WITH CORONARY ANGIOGRAM;  Surgeon: Ozell JONETTA Fell, MD;  Location: [facility] CATH LAB;  Service: Cardiovascular;  Laterality: N/A;    MOUTH SURGERY Right    PERCUTANEOUS CORONARY STENT INTERVENTION (PCI-S) N/A 05/03/2014   Procedure: PERCUTANEOUS CORONARY STENT INTERVENTION (PCI-S);  Surgeon: Erick JONELLE Ladona, MD;  Location: [facility] CATH LAB;  Service: Cardiovascular;  Laterality: N/A;   RADIOLOGY WITH ANESTHESIA N/A 02/08/2021   Procedure: MRI WITH ANESTHESIA OF ABDOMEN WITH AND WITHOUT CONSTRAST;  Surgeon: Radiologist, Medication, MD;  Location: [facility] OR;  Service: Radiology;  Laterality: N/A;   ROTATOR CUFF REPAIR  2022   UMBILICAL HERNIA REPAIR  yrs ago   Social History:   reports that he has never smoked. He has never used smokeless tobacco. He reports that he does not drink alcohol and does not use drugs.  Family History  Adopted: Yes  Problem Relation Age of Onset   Emphysema Mother     Medications: Patient's Medications  New Prescriptions   No medications on file  Previous Medications   ACETAMINOPHEN  (TYLENOL ) 500 MG TABLET    Take 500 mg by mouth every 6 (six) hours as needed for mild pain, moderate pain, headache or fever.   ALBUTEROL  (VENTOLIN  HFA) 108 (90 BASE) MCG/ACT INHALER    TAKE 2 PUFFS BY MOUTH EVERY 6 HOURS AS NEEDED FOR WHEEZE OR SHORTNESS OF BREATH   ASPIRIN  EC 81 MG TABLET    Take 1 tablet (81 mg total) by mouth daily. Swallow whole.   BENZONATATE  (TESSALON  PERLES) 100 MG CAPSULE    Take 2 capsules (200 mg total) by mouth 3 (three) times daily as needed for cough.   BUDESON-GLYCOPYRROL-FORMOTEROL (BREZTRI  AEROSPHERE) 160-9-4.8 MCG/ACT AERO    Inhale 2 puffs into the lungs in the morning and at bedtime.   CONTINUOUS BLOOD GLUC SENSOR (FREESTYLE LIBRE 14 DAY SENSOR) MISC    USE TO TEST BLOOD SUGAR THREE TIMES DAILY. E11.51. E11.59   DIPHENHYDRAMINE -ACETAMINOPHEN  (TYLENOL  PM) 25-500 MG TABS TABLET    Take 1 tablet by mouth at bedtime as needed.   FLUTICASONE  (FLONASE ) 50 MCG/ACT NASAL SPRAY    Place 1 spray into both nostrils daily as needed for allergies or rhinitis.   GABAPENTIN  (NEURONTIN ) 300 MG  CAPSULE    Take 3 capsules (900 mg total) by mouth at bedtime.   GLUCOSE 4 GM CHEWABLE TABLET    Chew 1 tablet (4 g total) by mouth as needed for low blood sugar.   HYDRALAZINE  (APRESOLINE ) 50 MG TABLET    Take 1 tablet (50 mg total) by mouth 3 (three) times daily. Can hold the medicine if blood pressure is low <110 mmHg   INSULIN  ASPART (FIASP  FLEXTOUCH) 100 UNIT/ML  FLEXTOUCH PEN    Inject 15-25 Units into the skin 3 (three) times daily before meals.   INSULIN  ASPART (NOVOLOG  FLEXPEN) 100 UNIT/ML FLEXPEN    INJECT 25-30 UNITS INTO THE SKIN SEE ADMIN INSTRUCTIONS. INJECT 30 UNITS SUBCUTANEOUSLY IN THE MORNING, INJECT 25 UNITS SUBCUTANEOUSLY WITH LUNCH & INJECT 25 UNITS SUBCUTANEOUSLY WITH DINNER.   INSULIN  GLARGINE (BASAGLAR  KWIKPEN) 100 UNIT/ML    Inject 36 Units into the skin at bedtime.   INSULIN  PEN NEEDLE (BD PEN NEEDLE NANO 2ND GEN) 32G X 4 MM MISC    USE AS DIRECTED   LIFESCAN FINEPOINT LANCETS MISC    Use to test for blood sugar three times daily dx: E11.51   METOPROLOL  SUCCINATE (TOPROL -XL) 25 MG 24 HR TABLET    TAKE 1 TABLET BY MOUTH EVERY DAY WITH OR IMMEDIATELY FOLLOWING A MEAL   NITROGLYCERIN  (NITROSTAT ) 0.4 MG SL TABLET    PLACE 1 TABLET UNDER THE TONGUE EVERY 5 MINUTES AS NEEDED FOR CHEST PAIN.   ONETOUCH ULTRA TEST STRIP    USE TO TEST FOR BLOOD SUGAR THREE TIMES DAILY DX: E11.51   PANTOPRAZOLE  (PROTONIX ) 40 MG TABLET    TAKE ONE TABLET BY MOUTH ONCE DAILY FOR STOMACH   PRALUENT  150 MG/ML SOAJ    INJECT 1 PEN INTO THE SKIN EVERY 14 (FOURTEEN) DAYS.   PRAMIPEXOLE  (MIRAPEX ) 0.5 MG TABLET    Take 0.25-0.5 mg by mouth as needed.   TIZANIDINE  (ZANAFLEX ) 4 MG TABLET    Take 4 mg by mouth every 8 (eight) hours as needed.   VALSARTAN -HYDROCHLOROTHIAZIDE  (DIOVAN  HCT) 160-25 MG TABLET    Take 1 tablet by mouth daily.  Modified Medications   No medications on file  Discontinued Medications   No medications on file    Physical Exam:  Vitals:   03/17/23 0824  BP: 136/72  Pulse: 78   Temp: (!) 97.5 F (36.4 C)  TempSrc: Temporal  SpO2: 96%  Weight: 203 lb (92.1 kg)  Height: 5' 8 (1.727 m)   Body mass index is 30.87 kg/m. Wt Readings from Last 3 Encounters:  03/17/23 203 lb (92.1 kg)  01/29/23 199 lb 9.6 oz (90.5 kg)  01/29/23 200 lb (90.7 kg)    Physical Exam Constitutional:      General: He is not in acute distress.    Appearance: He is well-developed. He is not diaphoretic.  HENT:     Head: Normocephalic and atraumatic.     Right Ear: External ear normal.     Left Ear: External ear normal.     Mouth/Throat:     Pharynx: No oropharyngeal exudate.  Eyes:     Conjunctiva/sclera: Conjunctivae normal.     Pupils: Pupils are equal, round, and reactive to light.  Cardiovascular:     Rate and Rhythm: Normal rate and regular rhythm.     Heart sounds: Normal heart sounds.  Pulmonary:     Effort: Pulmonary effort is normal.     Breath sounds: Normal breath sounds.  Abdominal:     General: Bowel sounds are normal.     Palpations: Abdomen is soft.  Musculoskeletal:        General: No tenderness.     Cervical back: Normal range of motion and neck supple.     Right lower leg: No edema.     Left lower leg: No edema.  Skin:    General: Skin is warm and dry.  Neurological:     Mental Status: He is alert and oriented to  person, place, and time.     Labs reviewed: Basic Metabolic Panel: Recent Labs    07/22/22 0903 12/23/22 2353  NA 141 133*  K 4.9 4.6  CL 104 98  CO2 29 26  GLUCOSE 154* 139*  BUN 18 39*  CREATININE 1.15 1.62*  CALCIUM  9.5 9.5   Liver Function Tests: Recent Labs    07/22/22 0903  AST 25  ALT 38  BILITOT 0.6  PROT 6.7   No results for input(s): LIPASE, AMYLASE in the last 8760 hours. No results for input(s): AMMONIA in the last 8760 hours. CBC: Recent Labs    07/22/22 0903 12/24/22 0335  WBC 4.8 9.1  NEUTROABS 2,894  --   HGB 15.2 15.7  HCT 44.5 46.6  MCV 89.5 89.8  PLT 124* 124*   Lipid Panel: No  results for input(s): CHOL, HDL, LDLCALC, TRIG, CHOLHDL, LDLDIRECT in the last 8760 hours. TSH: No results for input(s): TSH in the last 8760 hours. A1C: Lab Results  Component Value Date   HGBA1C 8.4 (A) 12/06/2022     Assessment/Plan 1. Restless legs syndrome Ongoing, symptoms stable on gabapentin  - gabapentin  (NEURONTIN ) 300 MG capsule; Take 3 capsules (900 mg total) by mouth at bedtime. G25.81  Dispense: 270 capsule; Refill: 0  2. Upper respiratory tract infection, unspecified type (Primary) - POC COVID-19-negative Continue supportive care.   3. Type 2 diabetes mellitus with hyperglycemia, with long-term current use of insulin  (HCC) -followed by endocrinology at this time, follow up later this month.  -Encouraged dietary compliance, routine foot care/monitoring and to keep up with diabetic eye exams through ophthalmology   4. Essential hypertension -Blood pressure well controlled, goal bp <140/90 Continue current medications and dietary modifications follow metabolic panel - COMPLETE METABOLIC PANEL WITH GFR; Future - CBC with Differential/Platelet; Future  5. 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 Iron  deficiency anemia, unspecified iron  deficiency anemia type - CBC with Differential/Platelet; Future  8. Pure hypercholesterolemia Continues on praluent  and followed by lipid clinic.  - Lipid panel; Future - COMPLETE METABOLIC PANEL WITH GFR; Future2  Return in about 6 months (around 09/14/2023) for routine follow up .  Sumit Branham K. Caro BODILY [facility] [phone]

## 2023-03-17 NOTE — Patient Instructions (Signed)
 Netipot or saline wash daily- for nasal congestion  Plain nasal saline spray throughout the day as needed May use tylenol  325 mg 2 tablets every 6-8 hours as needed aches and pains or sore throat humidifier in the home to help with the dry air Mucinex  DM by mouth twice daily as needed for cough and chest congestion with full glass of water  Keep well hydrated Proper nutrition  Avoid forcefully blowing nose Vit c 1000 mg daily Vit d 2000 units daily  If symptoms improve and then worsen let us  know or fail to improve after 2-3 weeks

## 2023-03-17 NOTE — ED Provider Notes (Signed)
 [place] [facility] Provider Note   CSN: [id number] Arrival date & time: 03/17/23  1756     History  Chief Complaint  Patient presents with   Kenneth Elgin Morones Sr. is a 73 y.o. male.  HPI     Fall x2 Slipped on ice Right leg weakness secondary to lumbar pathology since October--known and seeing Beverley Millman for this, having injections, continued follow up  Today was walking and left leg slipped on ice and fell forward onto knees.  Then was stepping over something and right leg gave out and fell backwards with righ tleg bent underneath him.  Happened earlier today. Now has pain and swelling of right knee, pain to left hip is more mild but present.  No new numbness or weakness. Hit head but reports not very hard, no headache, no neck pain, no chest or back pain.   Off of blood thinners for 2 weeks   Past Medical History:  Diagnosis Date   Arthritis    left thumb (05/23/2017) right shoulder   Barrett's esophagus    we've been told that it's all gone; still take RX for GERD (05/23/2017)   Bilateral swelling of feet and ankles x 3 weeks as of 12-01-2019   CAD (coronary artery disease), native coronary artery    Coronary angiogram 05/03/2014:  Proximal LAD 3.0x12 mm Promus premier stent.  04/08/2014: Mid Cx 3.5 x 16 mm Promus DES.  03/29/2013: Mid LAD 2.75 x 38 mm Promus Premier drug-eluting stent, balloon angioplasty of D1 and distal LAD and stenting of distal RCA with 2.75 x 24 mm promos Premier drug-eluting stent 12/15/2012 widely patent.   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  04/08/2014: Mid Cx 3.5 x 16 mm Promus DES.  03/29/2013: Mid LAD 2.75 x 38 mm Promus Premier drug-eluting stent, balloon angioplasty of D1 and distal LAD and stenting of distal RCA with 2.75 x 24 mm promos Premier drug-eluting stent 12/15/2012 widely patent.    Home Medications Prior to Admission medications   Medication Sig Start Date End Date Taking? Authorizing Provider  acetaminophen  (TYLENOL ) 500 MG tablet Take 500 mg by mouth every 6 (six) hours  as needed for mild pain, moderate pain, headache or fever.    [provider]  albuterol  (VENTOLIN  HFA) 108 (90 Base) MCG/ACT inhaler TAKE 2 PUFFS BY MOUTH EVERY 6 HOURS AS NEEDED FOR WHEEZE OR SHORTNESS OF BREATH 11/26/22   Caro Harlene POUR, NP  aspirin  EC 81 MG tablet Take 1 tablet (81 mg total) by mouth daily. Swallow whole. 04/11/21   Cantwell, Celeste C, PA-C  benzonatate  (TESSALON  PERLES) 100 MG capsule Take 2 capsules (200 mg total) by mouth 3 (three) times daily as needed for cough. 05/20/22   Fargo, Amy E, NP  Budeson-Glycopyrrol-Formoterol (BREZTRI  AEROSPHERE) 160-9-4.8 MCG/ACT AERO Inhale 2 puffs into the lungs in the morning and at bedtime. 11/07/21   Kassie Acquanetta Bradley, MD  Continuous Blood Gluc Sensor (FREESTYLE LIBRE 14 DAY SENSOR) MISC USE TO TEST BLOOD SUGAR THREE  TIMES DAILY. E11.51. E11.59 08/15/21   Caro Harlene POUR, NP  diphenhydramine -acetaminophen  (TYLENOL  PM) 25-500 MG TABS tablet Take 1 tablet by mouth at bedtime as needed.    [provider]  fluticasone  (FLONASE ) 50 MCG/ACT nasal spray Place 1 spray into both nostrils daily as needed for allergies or rhinitis. 10/11/21   Caro Harlene POUR, NP  gabapentin  (NEURONTIN ) 300 MG capsule Take 3 capsules (900 mg total) by mouth at bedtime. G25.81 03/17/23   Eubanks, Jessica K, NP  glucose 4 GM chewable tablet Chew 1 tablet (4 g total) by mouth as needed for low blood sugar. 07/28/20   Fargo, Amy E, NP  hydrALAZINE  (APRESOLINE ) 50 MG tablet Take 1 tablet (50 mg total) by mouth 3 (three) times daily. Can hold the medicine if blood pressure is low <110 mmHg 01/29/23 04/29/23  Ladona Heinz, MD  insulin  aspart (FIASP  FLEXTOUCH) 100 UNIT/ML FlexTouch Pen Inject 15-25 Units into the skin 3 (three) times daily before meals. 12/06/22   Trixie File, MD  insulin  aspart (NOVOLOG  FLEXPEN) 100 UNIT/ML FlexPen INJECT 25-30 UNITS INTO THE SKIN SEE ADMIN INSTRUCTIONS. INJECT 30 UNITS SUBCUTANEOUSLY IN THE MORNING, INJECT 25 UNITS SUBCUTANEOUSLY WITH LUNCH & INJECT 25 UNITS SUBCUTANEOUSLY WITH DINNER. 09/18/22   Trixie File, MD  Insulin  Glargine (BASAGLAR  KWIKPEN) 100 UNIT/ML Inject 36 Units into the skin at bedtime. 12/06/22   Trixie File, MD  Insulin  Pen Needle (BD PEN NEEDLE NANO 2ND GEN) 32G X 4 MM MISC USE AS DIRECTED 07/16/22   Caro Harlene POUR, NP  [facility] FINEPOINT LANCETS MISC Use to test for blood sugar three times daily dx: E11.51 06/01/18   Eubanks, Jessica K, NP  metoprolol  succinate (TOPROL -XL) 25 MG 24 hr tablet TAKE 1 TABLET BY MOUTH EVERY DAY WITH OR IMMEDIATELY FOLLOWING A MEAL 12/31/22   Eubanks, Jessica K, NP  nitroGLYCERIN  (NITROSTAT ) 0.4 MG SL tablet PLACE 1 TABLET UNDER THE TONGUE EVERY 5 MINUTES AS NEEDED FOR CHEST PAIN. 01/29/23   Ladona Heinz, MD  [facility] ULTRA test strip USE TO TEST  FOR BLOOD SUGAR THREE TIMES DAILY DX: E11.51 12/04/18   Reed, Tiffany L, DO  pantoprazole  (PROTONIX ) 40 MG tablet TAKE ONE TABLET BY MOUTH ONCE DAILY FOR STOMACH 10/14/22   Caro Harlene POUR, NP  PRALUENT  150 MG/ML SOAJ INJECT 1 PEN INTO THE SKIN EVERY 14 (FOURTEEN) DAYS. 12/31/21   Ladona Heinz, MD  pramipexole  (MIRAPEX ) 0.5 MG tablet Take 0.25-0.5 mg by mouth as needed.    [provider]  tiZANidine  (ZANAFLEX ) 4 MG tablet Take 1 tablet (4 mg total) by mouth every 8 (eight) hours as needed. 03/17/23   Caro Harlene POUR,  NP  valsartan -hydrochlorothiazide  (DIOVAN  HCT) 160-25 MG tablet Take 1 tablet by mouth daily. 12/30/22   Ladona Heinz, MD      Allergies    Amlodipine , Lyrica  [pregabalin ], Other, Statins, Ativan  [lorazepam ], and Trulicity  [dulaglutide ]    Review of Systems   Review of Systems  Physical Exam Updated Vital Signs BP (!) 170/87   Pulse 87   Temp 98 F (36.7 C) (Oral)   Resp 18   SpO2 98%  Physical Exam Vitals and nursing note reviewed.  Constitutional:      General: He is not in acute distress.    Appearance: He is well-developed. He is not diaphoretic.  HENT:     Head: Normocephalic and atraumatic.  Eyes:     Conjunctiva/sclera: Conjunctivae normal.  Cardiovascular:     Rate and Rhythm: Normal rate and regular rhythm.     Heart sounds: Normal heart sounds. No murmur heard.    No friction rub. No gallop.  Pulmonary:     Effort: Pulmonary effort is normal. No respiratory distress.     Breath sounds: Normal breath sounds. No wheezing or rales.  Chest:     Chest wall: No tenderness.  Abdominal:     General: There is no distension.     Palpations: Abdomen is soft.     Tenderness: There is no abdominal tenderness. There is no guarding.  Musculoskeletal:        General: Swelling (right knee) and tenderness (medial right knee, no tibial plateau tenderness, no tenderness to c/t/l spine) present. No deformity.     Cervical back: Normal range of motion.   Skin:    General: Skin is warm and dry.  Neurological:     Mental Status: He is alert and oriented to person, place, and time.     Comments: Weakness right lower extremity      ED Results / Procedures / Treatments   Labs (all labs ordered are listed, but only abnormal results are displayed) Labs Reviewed - No data to display  EKG None  Radiology DG Hip Unilat W or Wo Pelvis 2-3 Views Left Result Date: 03/17/2023 CLINICAL DATA:  Clemens twice today.  Left hip pain.  Fell on left hip. EXAM: DG HIP (WITH OR WITHOUT PELVIS) 2-3V LEFT COMPARISON:  CT abdomen and pelvis 02/06/2021 FINDINGS: Mild right femoroacetabular joint space narrowing. Mild right femoral head-neck junction degenerative osteophytosis, similar to prior. The bilateral sacroiliac and pubic symphysis joint spaces are maintained. No acute fracture is seen. Moderate atherosclerotic calcifications. IMPRESSION: Mild right femoroacetabular osteoarthritis. No acute fracture. Electronically Signed   By: Tanda Lyons M.D.   On: 03/17/2023 19:47   DG Knee Complete 4 Views Right Result Date: 03/17/2023 CLINICAL DATA:  Clemens twice today.  Right knee pain. EXAM: RIGHT KNEE - COMPLETE 4+ VIEW COMPARISON:  None Available. FINDINGS: Mild superior patellar degenerative osteophytosis. Small joint effusion. Mild anterior soft tissue swelling. Minimal medial compartment joint space narrowing. No acute fracture or dislocation. Moderate atherosclerotic calcifications. IMPRESSION: Small joint effusion. No acute fracture. Electronically Signed   By: Tanda Lyons M.D.   On: 03/17/2023 19:46    Procedures Procedures    Medications Ordered in ED Medications - No data to display  ED Course/ Medical Decision Making/ A&P                                   72yo male with history above presents with concern  for fall and knee and hip pain.  Falls related to ice and underlying right leg weakness from spinal pathology which is unchanged over the last  few months and followed by Beverley Millman.   No new numbness or weakness today.    Discussed possible head imaging but reports no significant head trauma, no headache, not on anticoagulation at this time, no new neurologic symptoms and will defer intracranial imaging--discussed reasons to return. No C/T/L spine nor chest/abdominal tenderness.   Left hip with arthritis, no fracture. Right knee XR without fracture, does have effusion. Do not suspect occult fracture by exam, do feel internal knee derangement/meniscal or ligamentous injury is possible given effusion, pain, swelling. Recommend WBAT, placed in knee immobilizer. Has crutches at home and also access to a walker. Recommend continued follow up with Dr. Beverley. Patient discharged in stable condition with understanding of reasons to return.        Final Clinical Impression(s) / ED Diagnoses Final diagnoses:  Fall, initial encounter  Acute pain of right knee  Effusion of right knee  Left hip pain    Rx / DC Orders ED Discharge Orders     None         Dreama Longs, MD 03/18/23 1106

## 2023-03-17 NOTE — ED Triage Notes (Signed)
 Reports 2 falls this morning , the second fall,  he injured his right knee , swelling and obvious distress with ambulation .

## 2023-03-19 DIAGNOSIS — M25561 Pain in right knee: Secondary | ICD-10-CM | POA: Diagnosis not present

## 2023-03-20 ENCOUNTER — Other Ambulatory Visit: Payer: Medicare Other

## 2023-03-20 ENCOUNTER — Other Ambulatory Visit: Payer: Self-pay

## 2023-03-20 DIAGNOSIS — D509 Iron deficiency anemia, unspecified: Secondary | ICD-10-CM

## 2023-03-20 DIAGNOSIS — E7800 Pure hypercholesterolemia, unspecified: Secondary | ICD-10-CM

## 2023-03-20 DIAGNOSIS — I10 Essential (primary) hypertension: Secondary | ICD-10-CM

## 2023-03-21 LAB — CBC WITH DIFFERENTIAL/PLATELET
Absolute Lymphocytes: 750 {cells}/uL — ABNORMAL LOW (ref 850–3900)
Absolute Monocytes: 310 {cells}/uL (ref 200–950)
Basophils Absolute: 20 {cells}/uL (ref 0–200)
Basophils Relative: 0.2 %
Eosinophils Absolute: 10 {cells}/uL — ABNORMAL LOW (ref 15–500)
Eosinophils Relative: 0.1 %
HCT: 42 % (ref 38.5–50.0)
Hemoglobin: 13.8 g/dL (ref 13.2–17.1)
MCH: 30.9 pg (ref 27.0–33.0)
MCHC: 32.9 g/dL (ref 32.0–36.0)
MCV: 94 fL (ref 80.0–100.0)
MPV: 11.4 fL (ref 7.5–12.5)
Monocytes Relative: 3.1 %
Neutro Abs: 8910 {cells}/uL — ABNORMAL HIGH (ref 1500–7800)
Neutrophils Relative %: 89.1 %
Platelets: 162 10*3/uL (ref 140–400)
RBC: 4.47 10*6/uL (ref 4.20–5.80)
RDW: 11.9 % (ref 11.0–15.0)
Total Lymphocyte: 7.5 %
WBC: 10 10*3/uL (ref 3.8–10.8)

## 2023-03-21 LAB — LIPID PANEL
Cholesterol: 186 mg/dL (ref <200)
HDL: 48 mg/dL (ref >=40)
LDL Cholesterol (Calc): 114 mg/dL — ABNORMAL HIGH
Non-HDL Cholesterol (Calc): 138 mg/dL — ABNORMAL HIGH (ref <130)
Total CHOL/HDL Ratio: 3.9 (calc) (ref <5.0)
Triglycerides: 125 mg/dL (ref <150)

## 2023-03-21 LAB — COMPLETE METABOLIC PANEL WITH GFR
AG Ratio: 1.7 (calc) (ref 1.0–2.5)
ALT: 22 U/L (ref 9–46)
AST: 14 U/L (ref 10–35)
Albumin: 4.3 g/dL (ref 3.6–5.1)
Alkaline phosphatase (APISO): 53 U/L (ref 35–144)
BUN/Creatinine Ratio: 25 (calc) — ABNORMAL HIGH (ref 6–22)
BUN: 26 mg/dL — ABNORMAL HIGH (ref 7–25)
CO2: 29 mmol/L (ref 20–32)
Calcium: 9.9 mg/dL (ref 8.6–10.3)
Chloride: 97 mmol/L — ABNORMAL LOW (ref 98–110)
Creat: 1.03 mg/dL (ref 0.70–1.28)
Globulin: 2.5 g/dL (ref 1.9–3.7)
Glucose, Bld: 201 mg/dL — ABNORMAL HIGH (ref 65–99)
Potassium: 5.1 mmol/L (ref 3.5–5.3)
Sodium: 135 mmol/L (ref 135–146)
Total Bilirubin: 0.7 mg/dL (ref 0.2–1.2)
Total Protein: 6.8 g/dL (ref 6.1–8.1)
eGFR: 77 mL/min/{1.73_m2} (ref >=60)

## 2023-03-24 ENCOUNTER — Encounter: Payer: Self-pay | Admitting: Nurse Practitioner

## 2023-03-27 DIAGNOSIS — M5417 Radiculopathy, lumbosacral region: Secondary | ICD-10-CM | POA: Diagnosis not present

## 2023-03-27 DIAGNOSIS — M5416 Radiculopathy, lumbar region: Secondary | ICD-10-CM | POA: Diagnosis not present

## 2023-03-27 DIAGNOSIS — M9902 Segmental and somatic dysfunction of thoracic region: Secondary | ICD-10-CM | POA: Diagnosis not present

## 2023-03-27 DIAGNOSIS — M9905 Segmental and somatic dysfunction of pelvic region: Secondary | ICD-10-CM | POA: Diagnosis not present

## 2023-03-27 DIAGNOSIS — M9903 Segmental and somatic dysfunction of lumbar region: Secondary | ICD-10-CM | POA: Diagnosis not present

## 2023-03-28 DIAGNOSIS — M5416 Radiculopathy, lumbar region: Secondary | ICD-10-CM | POA: Diagnosis not present

## 2023-04-03 ENCOUNTER — Encounter: Payer: Self-pay | Admitting: Internal Medicine

## 2023-04-03 ENCOUNTER — Ambulatory Visit (INDEPENDENT_AMBULATORY_CARE_PROVIDER_SITE_OTHER): Payer: Medicare Other | Admitting: Internal Medicine

## 2023-04-03 VITALS — BP 118/70 | HR 74 | Ht 68.0 in | Wt 200.2 lb

## 2023-04-03 DIAGNOSIS — Z794 Long term (current) use of insulin: Secondary | ICD-10-CM

## 2023-04-03 DIAGNOSIS — E1159 Type 2 diabetes mellitus with other circulatory complications: Secondary | ICD-10-CM | POA: Diagnosis not present

## 2023-04-03 DIAGNOSIS — E1165 Type 2 diabetes mellitus with hyperglycemia: Secondary | ICD-10-CM

## 2023-04-03 DIAGNOSIS — E785 Hyperlipidemia, unspecified: Secondary | ICD-10-CM | POA: Diagnosis not present

## 2023-04-03 LAB — POCT GLYCOSYLATED HEMOGLOBIN (HGB A1C): Hemoglobin A1C: 9.5 % — AB (ref 4.0–5.6)

## 2023-04-03 MED ORDER — BASAGLAR KWIKPEN 100 UNIT/ML ~~LOC~~ SOPN: 40.0000 [IU] | PEN_INJECTOR | Freq: Every day | SUBCUTANEOUS | 3 refills | Status: DC

## 2023-04-03 MED ORDER — FIASP FLEXTOUCH 100 UNIT/ML ~~LOC~~ SOPN: 15.0000 [IU] | PEN_INJECTOR | Freq: Three times a day (TID) | SUBCUTANEOUS | 3 refills | Status: DC

## 2023-04-03 NOTE — Patient Instructions (Addendum)
Please increase: - Basaglar 40 units daily (continue to increase by 5 units every 3 days until sugars stabilize within the target range  Continue: - Fiasp 30 units before meals  Use: - Fiasp sliding scale:  150-175: +4 176-200: +6 201-250: +10 251-300: +14 >300: +18  Please return in 1.52 months.

## 2023-04-03 NOTE — Progress Notes (Signed)
Patient ID: Kenneth Defenbaugh Sr., male   DOB: 1950/12/21, 73 y.o.   MRN: [id number]  HPI: Kenneth Triggs Sr. is a 73 y.o.-year-old male, returning for follow-up for DM2, dx in 2016, insulin-dependent  since 2020, uncontrolled, with complications (CAD, history of CVA, CKD stage III). Pt. previously saw Dr. Everardo All, but last visit with me 4 months ago.  He is here with his wife who offers part of the history especially regarding his activity and past medical history.  Interim history: No increased urination, nausea, chest pain.  He has bothersome restless legs. He has significant back, hip, and knee pain and he recently had 2 steroid inj's, latest: last week.  Sugars increased significantly afterwards.  She continues to see a Land.  The manipulations helped, but the effect is short-lived.  Reviewed HbA1c: Lab Results  Component Value Date   HGBA1C 8.4 (A) 12/06/2022   HGBA1C 9.1 (H) 07/22/2022   HGBA1C 7.8 (A) 02/28/2022   HGBA1C 8.1 (H) 10/05/2021   HGBA1C 8.3 (A) 05/07/2021   HGBA1C 7.9 (H) 01/15/2021   HGBA1C 8.1 (A) 12/25/2020   HGBA1C 8.6 (A) 09/14/2020   HGBA1C 8.7 (H) 06/30/2020   HGBA1C 8.6 (H) 02/22/2020   At last visit he was on: - Basaglar 35 >> 30 units at bedtime - Novolog:  (Taking it mostly after the meal) 10-20 units before B 10-15 units before L 15-20 unit before D He stopped Ozempic 0.5 mg weekly >> nausea, could not function. He had nausea with metformin. He also had nausea with Trulicity.  We changed to: - Basaglar 36 >> actually taking only 30 units daily - Fiasp 10-20 >> 30 units before meals - Fiasp sliding scale:  150-175: +4 176-200: +6 201-250: +10 251-300: +14 >300: +18  Pt checks his sugars >4 a day with his CGM:  Previously:  Previously:   Lowest sugar was 46 >> 54 >> 50s >> 64; he has hypoglycemia awareness at 70.  Highest sugar was 400 >> 350 >> 400 >> HI.  Glucometer: One Touch ultra  - + CKD, last BUN/creatinine:  Lab Results   Component Value Date   BUN 26 (H) 03/20/2023   BUN 39 (H) 12/23/2022   CREATININE 1.03 03/20/2023   CREATININE 1.62 (H) 12/23/2022   Lab Results  Component Value Date   MICRALBCREAT 12 07/22/2022   MICRALBCREAT 20 03/06/2020   MICRALBCREAT SEE NOTE 08/27/2016   MICRALBCREAT <10.2 08/26/2013  On Diovan 80 mg daily.  -+HL; last set of lipids: Lab Results  Component Value Date   CHOL 186 03/20/2023   HDL 48 03/20/2023   LDLCALC 114 (H) 03/20/2023   LDLDIRECT 110 (H) 06/17/2019   TRIG 125 03/20/2023   CHOLHDL 3.9 03/20/2023  He is on Praluent.  He had side effects from statins in the past.  - last eye exam was 11/13/2022. No DR reportedly.   - +  restless leg syndrome.  He is on Neurontin. Also, on pramipexole for restless leg syndrome.  He sees neurology.  Last foot exam 12/06/2022.  He also has a history of HTN, GERD, Barrett's esophagus, iron deficiency anemia, memory loss.  ROS: + see HPI  Past Medical History:  Diagnosis Date   Arthritis    "left thumb" (05/23/2017) right shoulder   Barrett's esophagus    "we've been told that it's all gone; still take RX for GERD" (05/23/2017)   Bilateral swelling of feet and ankles x 3 weeks as of 12-01-2019   CAD (coronary artery disease), native  coronary artery    Coronary angiogram 05/03/2014:  Proximal LAD 3.0x12 mm Promus premier stent.  04/08/2014: Mid Cx 3.5 x 16 mm Promus DES.  03/29/2013: Mid LAD 2.75 x 38 mm Promus Premier drug-eluting stent, balloon angioplasty of D1 and distal LAD and stenting of distal RCA with 2.75 x 24 mm promos Premier drug-eluting stent 12/15/2012 widely patent.   Cervicalgia    Chronic bronchitis (HCC)    "q yr" (05/23/2017)   Complication of anesthesia    " I do not wake up very well "  slow to wake up   COVID-19 05/2019   all covid symptoms in hospital x 5 days, all symptoms resolved took monoclonal antibody tx    Dyspnea    with exertion   Family history of adverse reaction to anesthesia    "son  w/PONV"   GERD (gastroesophageal reflux disease)    Heart murmur    "grew out of it"    Hyperlipidemia    Hypertension    Hypoglycemia, unspecified    IDDM (insulin dependent diabetes mellitus)    type 2   Impotence of organic origin    Iron deficiency anemia    Memory loss    mild   Other malaise and fatigue    Pneumonia 05/2019   PONV (postoperative nausea and vomiting)    after wisdom teeth pulled, no problems with other surgeries   Restless leg    Rotator cuff arthropathy    Stroke (HCC)    Tension headache    "sometimes" (05/23/2017)   Unspecified gastritis and gastroduodenitis with hemorrhage    Unstable angina pectoris (HCC) 04/08/2014   Coronary angiogram 05/03/2014:  Proximal LAD 3.0x12 mm Promus premier stent.  04/08/2014: Mid Cx 3.5 x 16 mm Promus DES.  03/29/2013: Mid LAD 2.75 x 38 mm Promus Premier drug-eluting stent, balloon angioplasty of D1 and distal LAD and stenting of distal RCA with 2.75 x 24 mm promos Premier drug-eluting stent 12/15/2012 widely patent.   Past Surgical History:  Procedure Laterality Date   BICEPT TENODESIS Right 12/07/2019   Procedure: RIGHT BICEPS TENODESIS;  Surgeon: Sheral Apley, MD;  [facility];  Service: Orthopedics;  Laterality: Right;   CARDIAC CATHETERIZATION N/A 09/25/2015   Procedure: Left Heart Cath and Coronary Angiography;  Surgeon: [name], MD;  Location: [facility] INVASIVE CV LAB;  Service: Cardiovascular;  Laterality: N/A;   CARDIAC CATHETERIZATION N/A 09/25/2015   Procedure: Intravascular Pressure Wire/FFR Study;  Surgeon: [name], MD;  Location: [facility] INVASIVE CV LAB;  Service: Cardiovascular;  Laterality: N/A;   CORONARY ANGIOPLASTY WITH STENT PLACEMENT  12/15/2012; 04/28/2014; 05/03/2014   "2; 1; 1" , 4 stents 2 balloons   CORONARY STENT INTERVENTION N/A 05/01/2021   Procedure: CORONARY STENT INTERVENTION;  Surgeon: [name], MD;  Location: [facility] INVASIVE CV LAB;  Service: Cardiovascular;  Laterality: N/A;    FRACTIONAL FLOW RESERVE WIRE Right 03/26/2013   Procedure: FRACTIONAL FLOW RESERVE WIRE;  Surgeon: Pamella Pert, MD;  Location: [facility] CATH LAB;  Service: Cardiovascular;  Laterality: Right;   FRACTIONAL FLOW RESERVE WIRE N/A 05/03/2014   Procedure: FRACTIONAL FLOW RESERVE WIRE;  Surgeon: Pamella Pert, MD;  Location: [facility] CATH LAB;  Service: Cardiovascular;  Laterality: N/A;   HERNIA REPAIR  2010   "umbilical"    INCISION AND DRAINAGE ABSCESS Left 05/2007   "groin"    KNEE SURGERY Right 1992;  2000   "calcium deposits removed" (12/15/2012)   LEFT HEART CATH AND CORONARY ANGIOGRAPHY  N/A 05/23/2017   Procedure: LEFT HEART CATH AND CORONARY ANGIOGRAPHY;  Surgeon: [name], MD;  Location: [facility] INVASIVE CV LAB;  Service: Cardiovascular;  Laterality: N/A;   LEFT HEART CATH AND CORONARY ANGIOGRAPHY N/A 05/01/2021   Procedure: LEFT HEART CATH AND CORONARY ANGIOGRAPHY;  Surgeon: [name], MD;  Location: [facility] INVASIVE CV LAB;  Service: Cardiovascular;  Laterality: N/A;   LEFT HEART CATHETERIZATION WITH CORONARY ANGIOGRAM N/A 12/15/2012   Procedure: LEFT HEART CATHETERIZATION WITH CORONARY ANGIOGRAM;  Surgeon: Pamella Pert, MD;  Location: [facility] CATH LAB;  Service: Cardiovascular;  Laterality: N/A;   LEFT HEART CATHETERIZATION WITH CORONARY ANGIOGRAM N/A 03/26/2013   Procedure: LEFT HEART CATHETERIZATION WITH CORONARY ANGIOGRAM;  Surgeon: Pamella Pert, MD;  Location: [facility] CATH LAB;  Service: Cardiovascular;  Laterality: N/A;   LEFT HEART CATHETERIZATION WITH CORONARY ANGIOGRAM N/A 04/08/2014   Procedure: LEFT HEART CATHETERIZATION WITH CORONARY ANGIOGRAM;  Surgeon: Micheline Chapman, MD;  Location: [facility] CATH LAB;  Service: Cardiovascular;  Laterality: N/A;   MOUTH SURGERY Right    PERCUTANEOUS CORONARY STENT INTERVENTION (PCI-S) N/A 05/03/2014   Procedure: PERCUTANEOUS CORONARY STENT INTERVENTION (PCI-S);  Surgeon: Pamella Pert, MD;  Location: [facility] CATH LAB;  Service: Cardiovascular;   Laterality: N/A;   RADIOLOGY WITH ANESTHESIA N/A 02/08/2021   Procedure: MRI WITH ANESTHESIA OF ABDOMEN WITH AND WITHOUT CONSTRAST;  Surgeon: Radiologist, Medication, MD;  Location: [facility] OR;  Service: Radiology;  Laterality: N/A;   ROTATOR CUFF REPAIR  2022   UMBILICAL HERNIA REPAIR  yrs ago   Social History   Socioeconomic History   Marital status: Married    Spouse name: Pam   Number of children: 2   Years of education: 12   Highest education level: High school graduate  Occupational History   Occupation: Naval architect    Comment: deliveries only now   Occupation: woodworking  Tobacco Use   Smoking status: Never   Smokeless tobacco: Never  Vaping Use   Vaping status: Never Used  Substance and Sexual Activity   Alcohol use: No   Drug use: No   Sexual activity: Not Currently  Other Topics Concern   Not on file  Social History Narrative   Right handed    Caffeine 2 cups daily   Winter time 4 cups daily   Lives family one level home    Semi Retired   Social Drivers of Corporate investment banker Strain: Low Risk  (01/18/2021)   Overall Financial Resource Strain (CARDIA)    Difficulty of Paying Living Expenses: Not very hard  Food Insecurity: No Food Insecurity (01/18/2021)   Hunger Vital Sign    Worried About Running Out of Food in the Last Year: Never true    Ran Out of Food in the Last Year: Never true  Transportation Needs: No Transportation Needs (01/18/2021)   PRAPARE - Administrator, Civil Service (Medical): No    Lack of Transportation (Non-Medical): No  Physical Activity: Inactive (04/25/2017)   Exercise Vital Sign    Days of Exercise per Week: 0 days    Minutes of Exercise per Session: 0 min  Stress: No Stress Concern Present (04/25/2017)   [employer] of Occupational Health - Occupational Stress Questionnaire    Feeling of Stress : Only a little  Social Connections: Moderately Integrated (04/25/2017)   Social Connection and Isolation Panel  [NHANES]    Frequency of Communication with Friends and Family: More than three times a week    Frequency  of Social Gatherings with Friends and Family: More than three times a week    Attends Religious Services: More than 4 times per year    Active Member of [employer] or Organizations: No    Attends Banker Meetings: Never    Marital Status: Married  Catering manager Violence: Not At Risk (04/25/2017)   Humiliation, Afraid, Rape, and Kick questionnaire    Fear of Current or Ex-Partner: No    Emotionally Abused: No    Physically Abused: No    Sexually Abused: No   Current Outpatient Medications on File Prior to Visit  Medication Sig Dispense Refill   acetaminophen (TYLENOL) 500 MG tablet Take 500 mg by mouth every 6 (six) hours as needed for mild pain, moderate pain, headache or fever.     albuterol (VENTOLIN HFA) 108 (90 Base) MCG/ACT inhaler TAKE 2 PUFFS BY MOUTH EVERY 6 HOURS AS NEEDED FOR WHEEZE OR SHORTNESS OF BREATH 6.7 each 2   aspirin EC 81 MG tablet Take 1 tablet (81 mg total) by mouth daily. Swallow whole. 90 tablet 3   benzonatate (TESSALON PERLES) 100 MG capsule Take 2 capsules (200 mg total) by mouth 3 (three) times daily as needed for cough. 30 capsule 0   Budeson-Glycopyrrol-Formoterol (BREZTRI AEROSPHERE) 160-9-4.8 MCG/ACT AERO Inhale 2 puffs into the lungs in the morning and at bedtime. 5.9 g 0   Continuous Blood Gluc Sensor (FREESTYLE LIBRE 14 DAY SENSOR) MISC USE TO TEST BLOOD SUGAR THREE TIMES DAILY. E11.51. E11.59 1 each 12   diphenhydramine-acetaminophen (TYLENOL PM) 25-500 MG TABS tablet Take 1 tablet by mouth at bedtime as needed.     fluticasone (FLONASE) 50 MCG/ACT nasal spray Place 1 spray into both nostrils daily as needed for allergies or rhinitis. 16 g 11   gabapentin (NEURONTIN) 300 MG capsule Take 3 capsules (900 mg total) by mouth at bedtime. G25.81 270 capsule 0   glucose 4 GM chewable tablet Chew 1 tablet (4 g total) by mouth as needed for low blood  sugar. 50 tablet 12   hydrALAZINE (APRESOLINE) 50 MG tablet Take 1 tablet (50 mg total) by mouth 3 (three) times daily. Can hold the medicine if blood pressure is low <110 mmHg 90 tablet 6   insulin aspart (FIASP FLEXTOUCH) 100 UNIT/ML FlexTouch Pen Inject 15-25 Units into the skin 3 (three) times daily before meals. 45 mL 3   insulin aspart (NOVOLOG FLEXPEN) 100 UNIT/ML FlexPen INJECT 25-30 UNITS INTO THE SKIN SEE ADMIN INSTRUCTIONS. INJECT 30 UNITS SUBCUTANEOUSLY IN THE MORNING, INJECT 25 UNITS SUBCUTANEOUSLY WITH LUNCH & INJECT 25 UNITS SUBCUTANEOUSLY WITH DINNER. 30 mL 1   Insulin Glargine (BASAGLAR KWIKPEN) 100 UNIT/ML Inject 36 Units into the skin at bedtime. 30 mL 3   Insulin Pen Needle (BD PEN NEEDLE NANO 2ND GEN) 32G X 4 MM MISC USE AS DIRECTED 100 each 3   LIFESCAN FINEPOINT LANCETS MISC Use to test for blood sugar three times daily dx: E11.51 300 each 1   metoprolol succinate (TOPROL-XL) 25 MG 24 hr tablet TAKE 1 TABLET BY MOUTH EVERY DAY WITH OR IMMEDIATELY FOLLOWING A MEAL 90 tablet 1   nitroGLYCERIN (NITROSTAT) 0.4 MG SL tablet PLACE 1 TABLET UNDER THE TONGUE EVERY 5 MINUTES AS NEEDED FOR CHEST PAIN. 50 tablet 2   ONETOUCH ULTRA test strip USE TO TEST FOR BLOOD SUGAR THREE TIMES DAILY DX: E11.51 300 strip 11   pantoprazole (PROTONIX) 40 MG tablet TAKE ONE TABLET BY MOUTH ONCE DAILY FOR STOMACH 90 tablet  3   PRALUENT 150 MG/ML SOAJ INJECT 1 PEN INTO THE SKIN EVERY 14 (FOURTEEN) DAYS. 6 mL 1   pramipexole (MIRAPEX) 0.5 MG tablet Take 0.25-0.5 mg by mouth as needed.     tiZANidine (ZANAFLEX) 4 MG tablet Take 1 tablet (4 mg total) by mouth every 8 (eight) hours as needed. 30 tablet 11   valsartan-hydrochlorothiazide (DIOVAN HCT) 160-25 MG tablet Take 1 tablet by mouth daily. 90 tablet 3   Current Facility-Administered Medications on File Prior to Visit  Medication Dose Route Frequency Provider Last Rate Last Admin   [COMPLETED] hydrALAZINE (APRESOLINE) injection 10 mg  10 mg Intravenous  Once [name], MD   10 mg at 05/01/21 1054   Allergies  Allergen Reactions   Amlodipine Other (See Comments)    Worsening restless legs, dyspnea and leg swelling   Lyrica [Pregabalin] Other (See Comments)    Made patient very lethargic the next morning, very hard to patient to function, move, etc.    Other Other (See Comments)    Convulsions, Causes Restless Legs, rhabdomyolysis   Statins Other (See Comments)    Causes Restless Legs, rhabdomyolysis   Ativan [Lorazepam] Other (See Comments)    Markedly increased restless legs   Trulicity [Dulaglutide] Other (See Comments)    GI side effects    Family History  Adopted: Yes  Problem Relation Age of Onset   Emphysema Mother    PE: BP 118/70   Pulse 74   Ht 5\' 8"  (1.727 m)   Wt 200 lb 3.2 oz (90.8 kg)   SpO2 97%   BMI 30.44 kg/m  Wt Readings from Last 10 Encounters:  04/03/23 200 lb 3.2 oz (90.8 kg)  03/17/23 203 lb (92.1 kg)  01/29/23 199 lb 9.6 oz (90.5 kg)  01/29/23 200 lb (90.7 kg)  12/23/22 204 lb (92.5 kg)  12/09/22 204 lb (92.5 kg)  12/06/22 202 lb 6.4 oz (91.8 kg)  11/06/22 202 lb (91.6 kg)  10/28/22 205 lb 9.6 oz (93.3 kg)  09/12/22 204 lb 3.2 oz (92.6 kg)   Constitutional: overweight, in NAD, in wheelchair Eyes: no exophthalmos ENT: no thyromegaly, no cervical lymphadenopathy Cardiovascular: RRR, No MRG Respiratory: CTA B Musculoskeletal: no deformities Skin:  no rashes Neurological: no tremor with outstretched hands  ASSESSMENT: 1. DM2, insulin-dependent, uncontrolled, with complications - CAD - h/o CVA - CKD stage III - severe hypoglycemia in 2019  2. HL  PLAN:  1. Patient with longstanding, uncontrolled, type 2 diabetes, on basal/bolus insulin regimen, with still poor control.  He was previously on a weekly GLP-1 receptor agonist but this was stopped due to nausea.  HbA1c at last visit was slightly lower, at 8.4%, but lower than expected from the CGM.  Sugars appears to be much higher than  before, mostly above target with significant increases after meals and largest increase after lunch and dinner.  He was traveling and not able to follow a strict diet and he was taking NovoLog mostly after he was eating.  I recommended to switch to [facility] and to vary the dose of the mealtime insulin based on the size of the meal.  I also gave him a sliding scale.  We increased Basaglar dose.  I strongly advised him to try to improve diet. CGM interpretation: -At today's visit, we reviewed his CGM downloads: It appears that 13% of values are in target range (goal >70%), while 87% are higher than 180 (goal <25%), and 0% are lower than 70 (goal <4%).  The calculated average blood sugar is 295.  The projected HbA1c for the next 3 months (GMI) is 10.4%. -Reviewing the CGM trends, sugars are very high at all times of the day, without a consistent pattern.  He mentions that sugars increase significantly after his steroid injections and have not improved despite the fact that he contacted [place] about the blood sugars and we advised to increase Fiasp doses.  He is taking an almost double dose of Fiasp now but he forgot about increasing the dose of Basaglar and he is actually taking less than recommended at last visit.  Since the sugars are uniform throughout the day, I advised him to increase his Basaglar dose and continue to go up until sugars decreased to the target range.  He is not planning to get any more steroid injections for now but may need knee/back surgery.  I did advise him that his blood sugars need to be much better controlled to be able to go on with the surgery. -He describes having had a low blood sugar last night, in the 60s.  Upon review of his CGM data, he drops his blood sugars probably over night.  Upon questioning, he corrected hyperglycemia with 32 units of insulin despite advised not to use more than 18.  I again underlined the need to adjust his Fiasp sliding scale. - I suggested to:  Patient  Instructions  Please increase: - Basaglar 40 units daily (continue to increase by 5 units every 3 days until sugars stabilize within the target range  Continue: - Fiasp 30 units before meals  Use: - Fiasp sliding scale:  150-175: +4 176-200: +6 201-250: +10 251-300: +14 >300: +18  Please return in 1.5-2 months.  - we checked his HbA1c: 9.5% (higher) - advised to check sugars at different times of the day - 4x a day, rotating check times - advised for yearly eye exams >> he is UTD - return to clinic in 1.5-2 months  2. HL -Latest lipid panel from 03/2023 showed LDL above target, otherwise fractions at goal: Lab Results  Component Value Date   CHOL 186 03/20/2023   HDL 48 03/20/2023   LDLCALC 114 (H) 03/20/2023   LDLDIRECT 110 (H) 06/17/2019   TRIG 125 03/20/2023   CHOLHDL 3.9 03/20/2023  -He is on Praluent, without side effects.  He previously had side effects from statins: Restless legs and rhabdomyolysis.  Carlus Pavlov, MD PhD [facility] Endocrinology

## 2023-04-12 ENCOUNTER — Other Ambulatory Visit: Payer: Self-pay | Admitting: Nurse Practitioner

## 2023-04-12 DIAGNOSIS — I10 Essential (primary) hypertension: Secondary | ICD-10-CM

## 2023-04-12 DIAGNOSIS — I25118 Atherosclerotic heart disease of native coronary artery with other forms of angina pectoris: Secondary | ICD-10-CM

## 2023-04-14 DIAGNOSIS — M25561 Pain in right knee: Secondary | ICD-10-CM | POA: Diagnosis not present

## 2023-04-22 DIAGNOSIS — M5126 Other intervertebral disc displacement, lumbar region: Secondary | ICD-10-CM | POA: Diagnosis not present

## 2023-04-22 DIAGNOSIS — Z6831 Body mass index (BMI) 31.0-31.9, adult: Secondary | ICD-10-CM | POA: Diagnosis not present

## 2023-04-23 ENCOUNTER — Telehealth: Payer: Self-pay | Admitting: *Deleted

## 2023-04-23 NOTE — Telephone Encounter (Signed)
   Pre-operative Risk Assessment    Patient Name: [name].  DOB: March 14, 1950 MRN: [id number]   Date of last office visit: 01/29/23 DR. [name] Date of next office visit: NONE   Request for Surgical Clearance    Procedure:  L3-4 EXTRAFORAMINAL MICRODISKECTOMY  Date of Surgery:  Clearance TBD                                Surgeon:  DR. Donalee Citrin Surgeon's Group or Practice Name:  [facility] Phone number:  [phone] Fax number:  [phone]   Type of Clearance Requested:   - Medical  - Pharmacy:  Hold Aspirin     Type of Anesthesia:  General    Additional requests/questions:    Elpidio Anis   04/23/2023, 11:04 AM

## 2023-04-23 NOTE — Telephone Encounter (Signed)
   Name: Kenneth Bohlman Sr.  DOB: [birth year]  MRN: [id number]  Primary Cardiologist: [name], MD  Chart reviewed as part of pre-operative protocol coverage. Because of Kenneth Borman Sr.'s past medical history and time since last visit, he will require a follow-up in-office visit in order to better assess preoperative cardiovascular risk.  Pre-op covering staff: - Please schedule appointment and call patient to inform them. If patient already had an upcoming appointment within acceptable timeframe, please add "pre-op clearance" to the appointment notes so provider is aware. - Please contact requesting surgeon's office via preferred method (i.e, phone, fax) to inform them of need for appointment prior to surgery.  He can hold aspirin x 5 to 7 days prior to procedure as long as no new symptoms at the time of office visit.  Please resume when medically safe to do so.  Sharlene Dory, PA-C  04/23/2023, 11:45 AM

## 2023-04-24 ENCOUNTER — Ambulatory Visit: Payer: Medicare Other | Admitting: Neurology

## 2023-04-24 NOTE — Telephone Encounter (Signed)
Called patient to set up in-person office for a pre-op  on 04/30/23 @ 8:50.

## 2023-04-24 NOTE — Telephone Encounter (Signed)
Called pt and apologized to pt he was scheduled with our EP provider and not gen card. Pt has been scheduled to see [name], [facility] 04/25/23 8 am at the [place] office. Pt is aware of the location and thanked me greatly for the help.   I will update all parties involved.

## 2023-04-25 ENCOUNTER — Ambulatory Visit (HOSPITAL_COMMUNITY)
Admission: RE | Admit: 2023-04-25 | Discharge: 2023-04-25 | Disposition: A | Discharge status: Home / Self Care | Payer: Medicare Other | Source: Ambulatory Visit | Attending: Cardiology | Admitting: Cardiology

## 2023-04-25 ENCOUNTER — Ambulatory Visit: Payer: Medicare Other | Admitting: Physician Assistant

## 2023-04-25 ENCOUNTER — Encounter: Payer: Self-pay | Admitting: Physician Assistant

## 2023-04-25 VITALS — BP 124/72 | HR 66 | Ht 68.0 in | Wt 209.0 lb

## 2023-04-25 DIAGNOSIS — I2089 Other forms of angina pectoris: Secondary | ICD-10-CM | POA: Insufficient documentation

## 2023-04-25 DIAGNOSIS — Z01810 Encounter for preprocedural cardiovascular examination: Secondary | ICD-10-CM

## 2023-04-25 DIAGNOSIS — R600 Localized edema: Secondary | ICD-10-CM | POA: Diagnosis not present

## 2023-04-25 DIAGNOSIS — I25118 Atherosclerotic heart disease of native coronary artery with other forms of angina pectoris: Secondary | ICD-10-CM

## 2023-04-25 DIAGNOSIS — I10 Essential (primary) hypertension: Secondary | ICD-10-CM

## 2023-04-25 DIAGNOSIS — G459 Transient cerebral ischemic attack, unspecified: Secondary | ICD-10-CM | POA: Diagnosis not present

## 2023-04-25 MED ORDER — FUROSEMIDE 40 MG PO TABS: 40.0000 mg | ORAL_TABLET | ORAL | 0 refills | Status: DC

## 2023-04-25 NOTE — Patient Instructions (Signed)
Medication Instructions:  Start Lasix 40 mg by mouth in the morning for 3 days. Script has been sent to [facility].  Stop taking hydralazine. *If you need a refill on your cardiac medications before your next appointment, please call your pharmacy*   Testing/Procedures: Your physician has requested that you have an echocardiogram. Echocardiography is a painless test that uses sound waves to create images of your heart. It provides your doctor with information about the size and shape of your heart and how well your heart's chambers and valves are working. This procedure takes approximately one hour. There are no restrictions for this procedure. Please do NOT wear cologne, perfume, aftershave, or lotions (deodorant is allowed). Please arrive 15 minutes prior to your appointment time. [address].   Please note: We ask at that you not bring children with you during ultrasound (echo/ vascular) testing. Due to room size and safety concerns, children are not allowed in the ultrasound rooms during exams. Our front office staff cannot provide observation of children in our lobby area while testing is being conducted. An adult accompanying a patient to their appointment will only be allowed in the ultrasound room at the discretion of the ultrasound technician under special circumstances. We apologize for any inconvenience.  Your physician has requested that you have a bilateral lower extremity venous duplex. This test is an ultrasound of the veins in the legs or arms. It looks at venous blood flow that carries blood from the heart to the legs or arms. Allow one hour for a Lower Venous exam. Allow thirty minutes for an Upper Venous exam. There are no restrictions or special instructions.  Please note: We ask at that you not bring children with you during ultrasound (echo/ vascular) testing. Due to room size and safety concerns, children are not allowed in the ultrasound rooms during exams. Our front office staff  cannot provide observation of children in our lobby area while testing is being conducted. An adult accompanying a patient to their appointment will only be allowed in the ultrasound room at the discretion of the ultrasound technician under special circumstances. We apologize for any inconvenience.     Follow-Up: At [facility], you and your health needs are our priority.  As part of our continuing mission to provide you with exceptional heart care, we have created designated Provider Care Teams.  These Care Teams include your primary Cardiologist (physician) and Advanced Practice Providers (APPs -  Physician Assistants and Nurse Practitioners) who all work together to provide you with the care you need, when you need it.  We recommend signing up for the patient portal called "MyChart".  Sign up information is provided on this After Visit Summary.  MyChart is used to connect with patients for Virtual Visits (Telemedicine).  Patients are able to view lab/test results, encounter notes, upcoming appointments, etc.  Non-urgent messages can be sent to your provider as well.   To learn more about what you can do with MyChart, go to [email or web address].    Your next appointment:   6 month(s)  Provider:   [name], MD     Other Instructions

## 2023-04-25 NOTE — Progress Notes (Addendum)
 Cardiology Office Note:    Date:  04/25/2023   ID:  Kenneth Sic Sr., DOB 03-26-1950, MRN [id number]  PCP:  [name], NP   [facility] Providers Cardiologist:  [name], MD     Referring MD: [name], NP   Chief Complaint  Patient presents with   Follow-up    Pre-op clearance.   Edema    Feet and legs.    History of Present Illness:    Kenneth Maclaren Sr. is a 73 y.o. male with a hx of hypertension, hyperlipidemia, insulin-dependent diabetes, CAD with prior stenting in the LAD, RCA, LCX and remote balloon angioplasty to distal LAD. He also has a history of GERD and barrett's esophagus, statin intolerance, and RLS.  Last heart catheterization 04/23/2021 showed patent proximal LAD stenting, patent LCx stent but a long 99% occlusion in the RCA proximal to previously placed stent requiring placement of 2 overlapping stents: 2.5 x 38 mm and 2.5 x 18 mm DES.  The distal stent overlapped previously placed distal RCA stent which was 2.75 x 24 mm.  He has 2 stents in the LAD including a 3.0 x 12 mm stent followed by 2.75 x 38 mm stent.   He was seen in the ER 03/17/2023 after slipping and falling on ice possibly due to right leg weakness secondary to lumbar pathology.  Patient is pending microdiscectomy in the lumbar region and presents today for preoperative risk evaluation for Mace.  He is at baseline fairly active at home, last fall could repeat for 1.5 hours.  However given his lumbar issues, he has had significant right leg pain limiting his activity.  He can still complete 4.0 METS by walking 4 blocks, he is limited by leg pain not angina.  He does recount a history of TIA for which he was seen at [facility].  On exam and noted unilateral swelling on his right leg.  He denies shortness of breath, dyspnea on exertion, orthopnea, and PND.  His lung fields are clear.  The swelling occurred after his fall in mid January.     Past Medical History:  Diagnosis Date    Arthritis    "left thumb" (05/23/2017) right shoulder   Barrett's esophagus    "we've been told that it's all gone; still take RX for GERD" (05/23/2017)   Bilateral swelling of feet and ankles x 3 weeks as of 12-01-2019   CAD (coronary artery disease), native coronary artery    Coronary angiogram 05/03/2014:  Proximal LAD 3.0x12 mm Promus premier stent.  04/08/2014: Mid Cx 3.5 x 16 mm Promus DES.  03/29/2013: Mid LAD 2.75 x 38 mm Promus Premier drug-eluting stent, balloon angioplasty of D1 and distal LAD and stenting of distal RCA with 2.75 x 24 mm promos Premier drug-eluting stent 12/15/2012 widely patent.   Cervicalgia    Chronic bronchitis (HCC)    "q yr" (05/23/2017)   Complication of anesthesia    " I do not wake up very well "  slow to wake up   COVID-19 05/2019   all covid symptoms in hospital x 5 days, all symptoms resolved took monoclonal antibody tx    Dyspnea    with exertion   Family history of adverse reaction to anesthesia    "son w/PONV"   GERD (gastroesophageal reflux disease)    Heart murmur    "grew out of it"    Hyperlipidemia    Hypertension    Hypoglycemia, unspecified    IDDM (insulin  dependent diabetes mellitus)    type 2   Impotence of organic origin    Iron deficiency anemia    Memory loss    mild   Other malaise and fatigue    Pneumonia 05/2019   PONV (postoperative nausea and vomiting)    after wisdom teeth pulled, no problems with other surgeries   Restless leg    Rotator cuff arthropathy    Stroke (HCC)    Tension headache    "sometimes" (05/23/2017)   Unspecified gastritis and gastroduodenitis with hemorrhage    Unstable angina pectoris (HCC) 04/08/2014   Coronary angiogram 05/03/2014:  Proximal LAD 3.0x12 mm Promus premier stent.  04/08/2014: Mid Cx 3.5 x 16 mm Promus DES.  03/29/2013: Mid LAD 2.75 x 38 mm Promus Premier drug-eluting stent, balloon angioplasty of D1 and distal LAD and stenting of distal RCA with 2.75 x 24 mm promos Premier drug-eluting stent  12/15/2012 widely patent.    Past Surgical History:  Procedure Laterality Date   BICEPT TENODESIS Right 12/07/2019   Procedure: RIGHT BICEPS TENODESIS;  Surgeon: Sheral Apley, MD;  Location: [facility];  Service: Orthopedics;  Laterality: Right;   CARDIAC CATHETERIZATION N/A 09/25/2015   Procedure: Left Heart Cath and Coronary Angiography;  Surgeon: [name], MD;  Location: [facility] INVASIVE CV LAB;  Service: Cardiovascular;  Laterality: N/A;   CARDIAC CATHETERIZATION N/A 09/25/2015   Procedure: Intravascular Pressure Wire/FFR Study;  Surgeon: [name], MD;  Location: [facility] INVASIVE CV LAB;  Service: Cardiovascular;  Laterality: N/A;   CORONARY ANGIOPLASTY WITH STENT PLACEMENT  12/15/2012; 04/28/2014; 05/03/2014   "2; 1; 1" , 4 stents 2 balloons   CORONARY STENT INTERVENTION N/A 05/01/2021   Procedure: CORONARY STENT INTERVENTION;  Surgeon: [name], MD;  Location: [facility] INVASIVE CV LAB;  Service: Cardiovascular;  Laterality: N/A;   FRACTIONAL FLOW RESERVE WIRE Right 03/26/2013   Procedure: FRACTIONAL FLOW RESERVE WIRE;  Surgeon: Pamella Pert, MD;  Location: [facility] CATH LAB;  Service: Cardiovascular;  Laterality: Right;   FRACTIONAL FLOW RESERVE WIRE N/A 05/03/2014   Procedure: FRACTIONAL FLOW RESERVE WIRE;  Surgeon: Pamella Pert, MD;  Location: [facility] CATH LAB;  Service: Cardiovascular;  Laterality: N/A;   HERNIA REPAIR  2010   "umbilical"    INCISION AND DRAINAGE ABSCESS Left 05/2007   "groin"    KNEE SURGERY Right 1992;  2000   "calcium deposits removed" (12/15/2012)   LEFT HEART CATH AND CORONARY ANGIOGRAPHY N/A 05/23/2017   Procedure: LEFT HEART CATH AND CORONARY ANGIOGRAPHY;  Surgeon: [name], MD;  Location: [facility] INVASIVE CV LAB;  Service: Cardiovascular;  Laterality: N/A;   LEFT HEART CATH AND CORONARY ANGIOGRAPHY N/A 05/01/2021   Procedure: LEFT HEART CATH AND CORONARY ANGIOGRAPHY;  Surgeon: [name], MD;  Location: [facility] INVASIVE CV LAB;  Service: Cardiovascular;   Laterality: N/A;   LEFT HEART CATHETERIZATION WITH CORONARY ANGIOGRAM N/A 12/15/2012   Procedure: LEFT HEART CATHETERIZATION WITH CORONARY ANGIOGRAM;  Surgeon: Pamella Pert, MD;  Location: [facility] CATH LAB;  Service: Cardiovascular;  Laterality: N/A;   LEFT HEART CATHETERIZATION WITH CORONARY ANGIOGRAM N/A 03/26/2013   Procedure: LEFT HEART CATHETERIZATION WITH CORONARY ANGIOGRAM;  Surgeon: Pamella Pert, MD;  Location: [facility] CATH LAB;  Service: Cardiovascular;  Laterality: N/A;   LEFT HEART CATHETERIZATION WITH CORONARY ANGIOGRAM N/A 04/08/2014   Procedure: LEFT HEART CATHETERIZATION WITH CORONARY ANGIOGRAM;  Surgeon: Micheline Chapman, MD;  Location: [facility] CATH LAB;  Service: Cardiovascular;  Laterality: N/A;   MOUTH SURGERY Right  PERCUTANEOUS CORONARY STENT INTERVENTION (PCI-S) N/A 05/03/2014   Procedure: PERCUTANEOUS CORONARY STENT INTERVENTION (PCI-S);  Surgeon: Pamella Pert, MD;  Location: [facility] CATH LAB;  Service: Cardiovascular;  Laterality: N/A;   RADIOLOGY WITH ANESTHESIA N/A 02/08/2021   Procedure: MRI WITH ANESTHESIA OF ABDOMEN WITH AND WITHOUT CONSTRAST;  Surgeon: Radiologist, Medication, MD;  Location: [facility] OR;  Service: Radiology;  Laterality: N/A;   ROTATOR CUFF REPAIR  2022   UMBILICAL HERNIA REPAIR  yrs ago    Current Medications: Current Meds  Medication Sig   acetaminophen (TYLENOL) 500 MG tablet Take 500 mg by mouth every 6 (six) hours as needed for mild pain, moderate pain, headache or fever.   albuterol (VENTOLIN HFA) 108 (90 Base) MCG/ACT inhaler TAKE 2 PUFFS BY MOUTH EVERY 6 HOURS AS NEEDED FOR WHEEZE OR SHORTNESS OF BREATH   aspirin EC 81 MG tablet Take 1 tablet (81 mg total) by mouth daily. Swallow whole.   benzonatate (TESSALON PERLES) 100 MG capsule Take 2 capsules (200 mg total) by mouth 3 (three) times daily as needed for cough.   Budeson-Glycopyrrol-Formoterol (BREZTRI AEROSPHERE) 160-9-4.8 MCG/ACT AERO Inhale 2 puffs into the lungs in the morning and at  bedtime.   Continuous Blood Gluc Sensor (FREESTYLE LIBRE 14 DAY SENSOR) MISC USE TO TEST BLOOD SUGAR THREE TIMES DAILY. E11.51. E11.59   diphenhydramine-acetaminophen (TYLENOL PM) 25-500 MG TABS tablet Take 1 tablet by mouth at bedtime as needed.   fluticasone (FLONASE) 50 MCG/ACT nasal spray Place 1 spray into both nostrils daily as needed for allergies or rhinitis.   furosemide (LASIX) 40 MG tablet Take 1 tablet (40 mg total) by mouth as directed for 3 days.   gabapentin (NEURONTIN) 300 MG capsule Take 3 capsules (900 mg total) by mouth at bedtime. G25.81   glucose 4 GM chewable tablet Chew 1 tablet (4 g total) by mouth as needed for low blood sugar.   insulin aspart (FIASP FLEXTOUCH) 100 UNIT/ML FlexTouch Pen Inject 15-25 Units into the skin 3 (three) times daily before meals.   Insulin Glargine (BASAGLAR KWIKPEN) 100 UNIT/ML Inject 40-50 Units into the skin at bedtime.   Insulin Pen Needle (BD PEN NEEDLE NANO 2ND GEN) 32G X 4 MM MISC USE AS DIRECTED   LIFESCAN FINEPOINT LANCETS MISC Use to test for blood sugar three times daily dx: E11.51   metoprolol succinate (TOPROL-XL) 25 MG 24 hr tablet TAKE 1 TABLET BY MOUTH EVERY DAY WITH OR IMMEDIATELY FOLLOWING A MEAL   nitroGLYCERIN (NITROSTAT) 0.4 MG SL tablet PLACE 1 TABLET UNDER THE TONGUE EVERY 5 MINUTES AS NEEDED FOR CHEST PAIN.   ONETOUCH ULTRA test strip USE TO TEST FOR BLOOD SUGAR THREE TIMES DAILY DX: E11.51   pantoprazole (PROTONIX) 40 MG tablet TAKE ONE TABLET BY MOUTH ONCE DAILY FOR STOMACH   PRALUENT 150 MG/ML SOAJ INJECT 1 PEN INTO THE SKIN EVERY 14 (FOURTEEN) DAYS.   pramipexole (MIRAPEX) 0.5 MG tablet Take 0.25-0.5 mg by mouth as needed.   tiZANidine (ZANAFLEX) 4 MG tablet Take 1 tablet (4 mg total) by mouth every 8 (eight) hours as needed.   valsartan-hydrochlorothiazide (DIOVAN HCT) 160-25 MG tablet Take 1 tablet by mouth daily.   [DISCONTINUED] hydrALAZINE (APRESOLINE) 50 MG tablet Take 1 tablet (50 mg total) by mouth 3 (three)  times daily. Can hold the medicine if blood pressure is low <110 mmHg     Allergies:   Amlodipine, Lyrica [pregabalin], Other, Statins, Ativan [lorazepam], and Trulicity [dulaglutide]   Social History   Socioeconomic History  Marital status: Married    Spouse name: Pam   Number of children: 2   Years of education: 12   Highest education level: High school graduate  Occupational History   Occupation: Naval architect    Comment: deliveries only now   Occupation: woodworking  Tobacco Use   Smoking status: Never   Smokeless tobacco: Never  Vaping Use   Vaping status: Never Used  Substance and Sexual Activity   Alcohol use: No   Drug use: No   Sexual activity: Not Currently  Other Topics Concern   Not on file  Social History Narrative   Right handed    Caffeine 2 cups daily   Winter time 4 cups daily   Lives family one level home    Semi Retired   Social Drivers of Corporate investment banker Strain: Low Risk  (01/18/2021)   Overall Financial Resource Strain (CARDIA)    Difficulty of Paying Living Expenses: Not very hard  Food Insecurity: No Food Insecurity (01/18/2021)   Hunger Vital Sign    Worried About Running Out of Food in the Last Year: Never true    Ran Out of Food in the Last Year: Never true  Transportation Needs: No Transportation Needs (01/18/2021)   PRAPARE - Administrator, Civil Service (Medical): No    Lack of Transportation (Non-Medical): No  Physical Activity: Inactive (04/25/2017)   Exercise Vital Sign    Days of Exercise per Week: 0 days    Minutes of Exercise per Session: 0 min  Stress: No Stress Concern Present (04/25/2017)   [employer] of Occupational Health - Occupational Stress Questionnaire    Feeling of Stress : Only a little  Social Connections: Moderately Integrated (04/25/2017)   Social Connection and Isolation Panel [NHANES]    Frequency of Communication with Friends and Family: More than three times a week    Frequency  of Social Gatherings with Friends and Family: More than three times a week    Attends Religious Services: More than 4 times per year    Active Member of [employer] or Organizations: No    Attends Banker Meetings: Never    Marital Status: Married     Family History: The patient's family history includes Emphysema in his mother. He was adopted.  ROS:   Please see the history of present illness.     All other systems reviewed and are negative.  EKGs/Labs/Other Studies Reviewed:    The following studies were reviewed today:  LHC 2023 Left Heart Catheterization 05/01/21:  LV 135/8, EDP 18 mmHg.  Ao 142/72, mean 104 mmHg.  No pressure gradient across the aortic valve. Left main: Not present LAD: Moderate to large vessel in the proximal segment however it is diffusely diseased.  Proximal 3.0 x 12 mm Promus DES placed 05/03/2014 widely patent.  Mid LAD 2.75 x 38 mm Promus Premier DES, balloon angioplasty of D1 and distal LAD performed 03/29/2013 is widely patent. CX: Large vessel, ostial circumflex 30 to 40% stenosis unchanged from prior catheterization, a 3.5 x 16 mm Promus DES placed 04/08/2014 is widely patent. RCA: Large vessel, mid segment has a diffuse long segment 99% stenosis.  Previously placed 2.74 x 24 mm Promus Premier DES on 12/15/2012 is widely patent.   Interventional data: Successful PTCA and stenting of the mid and distal right coronary artery with 2 overlapping 2.5 x 38 mm and a 2.5 x 18 mm Onyx frontier DES, distal end of the stent overlapped  2.75 x 24 mm Promus Premier DES placed 12/15/2012.  TIMI II to TIMI-3 flow at the end of the procedure.   Recommendation: Patient to be observed for the small hematoma in the right forearm, but can be discharged home today if he remains stable, will be continued on aspirin and clopidogrel for at least 6 months.  110 mL contrast utilized.  EKG Interpretation Date/Time:  Friday April 25 2023 08:41:52 EST Ventricular Rate:  65 PR  Interval:  146 QRS Duration:  94 QT Interval:  434 QTC Calculation: 451 R Axis:   -27  Text Interpretation: Normal sinus rhythm Minimal voltage criteria for LVH, may be normal variant ( R in aVL ) When compared with ECG of 23-Dec-2022 23:50, PREVIOUS ECG IS PRESENT Confirmed by Micah Flesher ([id number]) on 04/25/2023 8:44:00 AM    Recent Labs: 03/20/2023: ALT 22; BUN 26; Creat 1.03; Hemoglobin 13.8; Platelets 162; Potassium 5.1; Sodium 135  Recent Lipid Panel    Component Value Date/Time   CHOL 186 03/20/2023 0809   CHOL 86 (L) 07/03/2015 0816   TRIG 125 03/20/2023 0809   HDL 48 03/20/2023 0809   HDL 37 (L) 07/03/2015 0816   CHOLHDL 3.9 03/20/2023 0809   VLDL 35 01/15/2021 0811   LDLCALC 114 (H) 03/20/2023 0809   LDLDIRECT 110 (H) 06/17/2019 0816     Risk Assessment/Calculations:                Physical Exam:    VS:  BP 124/72 (BP Location: Right Arm, Patient Position: Sitting, Cuff Size: Normal)   Pulse 66   Ht 5\' 8"  (1.727 m)   Wt 209 lb (94.8 kg)   BMI 31.78 kg/m     Wt Readings from Last 3 Encounters:  04/25/23 209 lb (94.8 kg)  04/03/23 200 lb 3.2 oz (90.8 kg)  03/17/23 203 lb (92.1 kg)     GEN:  Well nourished, well developed in no acute distress HEENT: Normal NECK: No JVD; No carotid bruits LYMPHATICS: No lymphadenopathy CARDIAC: RRR, no murmurs, rubs, gallops RESPIRATORY:  Clear to auscultation without rales, wheezing or rhonchi  ABDOMEN: Soft, non-tender, non-distended MUSCULOSKELETAL:  2+ pitting edema on RLE SKIN: Warm and dry NEUROLOGIC:  Alert and oriented x 3 PSYCHIATRIC:  Normal affect   ASSESSMENT:    1. Essential hypertension   2. TIA (transient ischemic attack)   3. Coronary artery disease of native artery of native heart with stable angina pectoris (HCC)   4. Stable angina pectoris (HCC)   5. Leg edema   6. Preoperative cardiovascular examination   7. Bilateral lower extremity edema    PLAN:    In order of problems listed  above:  Unilateral right leg swelling - He has 2+ pitting edema on his right leg, mild edema on his left leg - He denies dyspnea on exertion, resting shortness of breath, orthopnea, PND - His exam shows good lung sounds without crackles, no JVD - I will add a short course of 40 mg Lasix x 3 days - Given his fall in January with subsequent right leg swelling, I will rule out a DVT - I will also repeat an echocardiogram   CAD Chronic diastolic heart failure Hypertension - BP controlled, not taking hydralazine - continue ASA monotherapy   Preoperative risk evaluation for Mace prior to lumbar discectomy He has a history of ischemic heart disease, TIA, and insulin dependence.  This confers an 11% risk of Mace.  His activity is limited by his right leg  pain, not angina.  He reports activity equivalent to 4.0 METS without angina. Given his history of greater than 25 mm stenting in the LAD and RCA, I will reach out to Dr. [name] for guidance to hold aspirin prior to spine surgery.  I am opting to per repeat an echocardiogram for risk stratification. -Given that he is at high risk, 11% risk of Mace, I will reach out to Dr. [name] once the studies are completed for final recommendations regarding surgery - I suspect that if the ultrasound is largely unchanged with no new EF reduction or valvular disease, he may be able to proceed with surgery understanding that he has an elevated risk, but I will defer to Dr. [name] on final recommendations   Follow up with Dr. [name] in 6 months.     ADDENDUM: Per Dr. [name]: I am okay with your assessment. He has been very stable although he is obviously high risk individual, but except for DM, risks are well controlled. Okay to hold ASA for the procedure as it is neuro related.             Medication Adjustments/Labs and Tests Ordered: Current medicines are reviewed at length with the patient today.  Concerns regarding medicines are outlined above.   Orders Placed This Encounter  Procedures   EKG 12-Lead   ECHOCARDIOGRAM COMPLETE   VAS [place] LOWER EXTREMITY VENOUS (DVT)   Meds ordered this encounter  Medications   furosemide (LASIX) 40 MG tablet    Sig: Take 1 tablet (40 mg total) by mouth as directed for 3 days.    Dispense:  15 tablet    Refill:  0    Patient Instructions  Medication Instructions:  Start Lasix 40 mg by mouth in the morning for 3 days. Script has been sent to [facility].  Stop taking hydralazine. *If you need a refill on your cardiac medications before your next appointment, please call your pharmacy*   Testing/Procedures: Your physician has requested that you have an echocardiogram. Echocardiography is a painless test that uses sound waves to create images of your heart. It provides your doctor with information about the size and shape of your heart and how well your heart's chambers and valves are working. This procedure takes approximately one hour. There are no restrictions for this procedure. Please do NOT wear cologne, perfume, aftershave, or lotions (deodorant is allowed). Please arrive 15 minutes prior to your appointment time. [address].   Please note: We ask at that you not bring children with you during ultrasound (echo/ vascular) testing. Due to room size and safety concerns, children are not allowed in the ultrasound rooms during exams. Our front office staff cannot provide observation of children in our lobby area while testing is being conducted. An adult accompanying a patient to their appointment will only be allowed in the ultrasound room at the discretion of the ultrasound technician under special circumstances. We apologize for any inconvenience.  Your physician has requested that you have a bilateral lower extremity venous duplex. This test is an ultrasound of the veins in the legs or arms. It looks at venous blood flow that carries blood from the heart to the legs or arms. Allow one hour for a  Lower Venous exam. Allow thirty minutes for an Upper Venous exam. There are no restrictions or special instructions.  Please note: We ask at that you not bring children with you during ultrasound (echo/ vascular) testing. Due to room size and safety concerns,  children are not allowed in the ultrasound rooms during exams. Our front office staff cannot provide observation of children in our lobby area while testing is being conducted. An adult accompanying a patient to their appointment will only be allowed in the ultrasound room at the discretion of the ultrasound technician under special circumstances. We apologize for any inconvenience.     Follow-Up: At [facility], you and your health needs are our priority.  As part of our continuing mission to provide you with exceptional heart care, we have created designated Provider Care Teams.  These Care Teams include your primary Cardiologist (physician) and Advanced Practice Providers (APPs -  Physician Assistants and Nurse Practitioners) who all work together to provide you with the care you need, when you need it.  We recommend signing up for the patient portal called "MyChart".  Sign up information is provided on this After Visit Summary.  MyChart is used to connect with patients for Virtual Visits (Telemedicine).  Patients are able to view lab/test results, encounter notes, upcoming appointments, etc.  Non-urgent messages can be sent to your provider as well.   To learn more about what you can do with MyChart, go to [email or web address].    Your next appointment:   6 month(s)  Provider:   [name], MD     Other Instructions         Signed, Marcelino Duster, PA  04/25/2023 9:11 AM    [facility]

## 2023-04-28 ENCOUNTER — Ambulatory Visit (INDEPENDENT_AMBULATORY_CARE_PROVIDER_SITE_OTHER): Payer: Medicare Other | Admitting: Nurse Practitioner

## 2023-04-28 ENCOUNTER — Encounter: Payer: Self-pay | Admitting: Nurse Practitioner

## 2023-04-28 VITALS — BP 138/72 | HR 67 | Temp 97.8°F | Resp 19 | Ht 68.0 in | Wt 211.8 lb

## 2023-04-28 DIAGNOSIS — R600 Localized edema: Secondary | ICD-10-CM | POA: Diagnosis not present

## 2023-04-28 DIAGNOSIS — E1165 Type 2 diabetes mellitus with hyperglycemia: Secondary | ICD-10-CM | POA: Diagnosis not present

## 2023-04-28 DIAGNOSIS — Z794 Long term (current) use of insulin: Secondary | ICD-10-CM

## 2023-04-28 DIAGNOSIS — E66811 Obesity, class 1: Secondary | ICD-10-CM

## 2023-04-28 DIAGNOSIS — M5126 Other intervertebral disc displacement, lumbar region: Secondary | ICD-10-CM | POA: Diagnosis not present

## 2023-04-28 DIAGNOSIS — I25118 Atherosclerotic heart disease of native coronary artery with other forms of angina pectoris: Secondary | ICD-10-CM

## 2023-04-28 DIAGNOSIS — Z0279 Encounter for issue of other medical certificate: Secondary | ICD-10-CM

## 2023-04-28 NOTE — Progress Notes (Unsigned)
 Careteam: Patient Care Team: [name], NP as PCP - General (Geriatric Medicine) [name], MD as PCP - Cardiology (Cardiology) [name], MD as Consulting Physician (Cardiology) Van Clines, MD as Consulting Physician (Neurology) [name], [name], OD (Optometry) [name], MD as Consulting Physician (Otolaryngology)  PLACE OF SERVICE:  [facility]  Advanced Directive information Does Patient Have a Medical Advance Directive?: No, Would patient like information on creating a medical advance directive?: No - Patient declined  Allergies  Allergen Reactions   Amlodipine Other (See Comments)    Worsening restless legs, dyspnea and leg swelling   Lyrica [Pregabalin] Other (See Comments)    Made patient very lethargic the next morning, very hard to patient to function, move, etc.    Other Other (See Comments)    Convulsions, Causes Restless Legs, rhabdomyolysis   Statins Other (See Comments)    Causes Restless Legs, rhabdomyolysis   Ativan [Lorazepam] Other (See Comments)    Markedly increased restless legs   Trulicity [Dulaglutide] Other (See Comments)    GI side effects     Chief Complaint  Patient presents with   Pre-op Exam    Pre-Surgical evaluation      Discussed the use of AI scribe software for clinical note transcription with the patient, who gave verbal consent to proceed.  History of Present Illness   Kenneth Jeffries Sr. is a 73 year old male with diabetes and lumbar disc herniation who presents for preoperative evaluation.  He is scheduled for lumbar surgery due to an extruding disc at the L3 level, which is compressing a nerve and causing significant weakness in his right leg. The surgery is pending medical clearance, primarily due to elevated blood sugar levels and ongoing cardiac concerns. He has been following with cardiology due to cardiac hx of htn, CAD, hyperlipidemia.   He is at high cardiovascular risk and is scheduled for a heart ultrasound  on the 13th. He is currently on aspirin and was taken off Plavix three to four months ago.  He has a history of diabetes with fluctuating A1c levels: 9.1 nine months ago, 8.4 four months ago, and 7.8 a year ago. His current A1c is 9.5. He attributes the increase in blood sugar levels to steroid use for pain management. He has a morning glucose level of 65 mg/dL, managed with orange juice and peanut butter, and a current level of 132 mg/dL. He is on a protein shake regimen with 30 grams of protein, 5 grams of carbs, and 1 gram of sugar.  He has been experiencing leg swelling for about three weeks, which has improved slightly with a diuretic (Lasix) prescribed for three days. He missed the last dose and plans to take it today. He reports frequent urination, which has increased with the use of a diuretic, and occasional constipation that resolves within a day.  He is gaining weight due to decreased mobility and dietary habits. He experiences occasional headaches, which he associates with caffeine withdrawal, as they resolve after consuming coffee. No shortness of breath, cough, congestion, or significant issues with bowel movements.      Review of Systems:  Review of Systems  Constitutional:  Negative for chills, fever and weight loss.  HENT:  Negative for tinnitus.   Respiratory:  Negative for cough, sputum production and shortness of breath.   Cardiovascular:  Negative for chest pain, palpitations and leg swelling.  Gastrointestinal:  Negative for abdominal pain, constipation, diarrhea and heartburn.  Genitourinary:  Negative for  dysuria, frequency and urgency.  Musculoskeletal:  Negative for back pain, falls, joint pain and myalgias.  Skin: Negative.   Neurological:  Negative for dizziness and headaches.  Psychiatric/Behavioral:  Negative for depression and memory loss. The patient does not have insomnia.     Past Medical History:  Diagnosis Date   Arthritis    "left thumb" (05/23/2017)  right shoulder   Barrett's esophagus    "we've been told that it's all gone; still take RX for GERD" (05/23/2017)   Bilateral swelling of feet and ankles x 3 weeks as of 12-01-2019   CAD (coronary artery disease), native coronary artery    Coronary angiogram 05/03/2014:  Proximal LAD 3.0x12 mm Promus premier stent.  04/08/2014: Mid Cx 3.5 x 16 mm Promus DES.  03/29/2013: Mid LAD 2.75 x 38 mm Promus Premier drug-eluting stent, balloon angioplasty of D1 and distal LAD and stenting of distal RCA with 2.75 x 24 mm promos Premier drug-eluting stent 12/15/2012 widely patent.   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  04/08/2014: Mid Cx 3.5 x 16 mm Promus DES.  03/29/2013: Mid LAD 2.75 x 38 mm Promus Premier drug-eluting stent, balloon angioplasty of D1 and distal LAD and stenting of distal RCA with 2.75 x 24 mm promos Premier drug-eluting stent 12/15/2012 widely patent.   Past  Surgical History:  Procedure Laterality Date   BICEPT TENODESIS Right 12/07/2019   Procedure: RIGHT BICEPS TENODESIS;  Surgeon: Sheral Apley, MD;  Location: [facility];  Service: Orthopedics;  Laterality: Right;   CARDIAC CATHETERIZATION N/A 09/25/2015   Procedure: Left Heart Cath and Coronary Angiography;  Surgeon: [name], MD;  Location: [facility] INVASIVE CV LAB;  Service: Cardiovascular;  Laterality: N/A;   CARDIAC CATHETERIZATION N/A 09/25/2015   Procedure: Intravascular Pressure Wire/FFR Study;  Surgeon: [name], MD;  Location: [facility] INVASIVE CV LAB;  Service: Cardiovascular;  Laterality: N/A;   CORONARY ANGIOPLASTY WITH STENT PLACEMENT  12/15/2012; 04/28/2014; 05/03/2014   "2; 1; 1" , 4 stents 2 balloons   CORONARY STENT INTERVENTION N/A 05/01/2021   Procedure: CORONARY STENT INTERVENTION;  Surgeon: [name], MD;  Location: [facility] INVASIVE CV LAB;  Service: Cardiovascular;  Laterality: N/A;   FRACTIONAL  FLOW RESERVE WIRE Right 03/26/2013   Procedure: FRACTIONAL FLOW RESERVE WIRE;  Surgeon: Pamella Pert, MD;  Location: [facility] CATH LAB;  Service: Cardiovascular;  Laterality: Right;   FRACTIONAL FLOW RESERVE WIRE N/A 05/03/2014   Procedure: FRACTIONAL FLOW RESERVE WIRE;  Surgeon: Pamella Pert, MD;  Location: [facility] CATH LAB;  Service: Cardiovascular;  Laterality: N/A;   HERNIA REPAIR  2010   "umbilical"    INCISION AND DRAINAGE ABSCESS Left 05/2007   "groin"    KNEE SURGERY Right 1992;  2000   "calcium deposits removed" (12/15/2012)   LEFT HEART CATH AND CORONARY ANGIOGRAPHY N/A 05/23/2017   Procedure: LEFT HEART CATH AND CORONARY ANGIOGRAPHY;  Surgeon: [name], MD;  Location: [facility] INVASIVE CV LAB;  Service: Cardiovascular;  Laterality: N/A;   LEFT HEART CATH AND CORONARY ANGIOGRAPHY N/A 05/01/2021   Procedure: LEFT HEART CATH AND CORONARY ANGIOGRAPHY;  Surgeon: [name], MD;  Location: [facility] INVASIVE CV LAB;  Service: Cardiovascular;  Laterality: N/A;   LEFT HEART  CATHETERIZATION WITH CORONARY ANGIOGRAM N/A 12/15/2012   Procedure: LEFT HEART CATHETERIZATION WITH CORONARY ANGIOGRAM;  Surgeon: Pamella Pert, MD;  Location: [facility] CATH LAB;  Service: Cardiovascular;  Laterality: N/A;   LEFT HEART CATHETERIZATION WITH CORONARY ANGIOGRAM N/A 03/26/2013   Procedure: LEFT HEART CATHETERIZATION WITH CORONARY ANGIOGRAM;  Surgeon: Pamella Pert, MD;  Location: [facility] CATH LAB;  Service: Cardiovascular;  Laterality: N/A;   LEFT HEART CATHETERIZATION WITH CORONARY ANGIOGRAM N/A 04/08/2014   Procedure: LEFT HEART CATHETERIZATION WITH CORONARY ANGIOGRAM;  Surgeon: Micheline Chapman, MD;  Location: [facility] CATH LAB;  Service: Cardiovascular;  Laterality: N/A;   MOUTH SURGERY Right    PERCUTANEOUS CORONARY STENT INTERVENTION (PCI-S) N/A 05/03/2014   Procedure: PERCUTANEOUS CORONARY STENT INTERVENTION (PCI-S);  Surgeon: Pamella Pert, MD;  Location: [facility] CATH LAB;  Service: Cardiovascular;  Laterality: N/A;   RADIOLOGY WITH ANESTHESIA N/A 02/08/2021   Procedure: MRI WITH ANESTHESIA OF ABDOMEN WITH AND WITHOUT CONSTRAST;  Surgeon: Radiologist, Medication, MD;  Location: [facility] OR;  Service: Radiology;  Laterality: N/A;   ROTATOR CUFF REPAIR  2022   UMBILICAL HERNIA REPAIR  yrs ago   Social History:   reports that he has never smoked. He has never used smokeless tobacco. He reports that he does not drink alcohol and does not use drugs.  Family History  Adopted: Yes  Problem Relation Age of Onset   Emphysema Mother     Medications: Patient's Medications  New Prescriptions   No medications on file  Previous Medications   ACETAMINOPHEN (TYLENOL) 500 MG TABLET    Take 500 mg by mouth every 6 (six) hours as needed for mild pain, moderate pain, headache or fever.   ALBUTEROL (VENTOLIN HFA) 108 (90 BASE) MCG/ACT INHALER    TAKE 2 PUFFS BY MOUTH EVERY 6 HOURS AS NEEDED FOR WHEEZE OR SHORTNESS OF BREATH   ASPIRIN EC 81 MG TABLET    Take 1 tablet (81 mg total) by mouth daily.  Swallow whole.   BENZONATATE (TESSALON PERLES) 100 MG CAPSULE    Take 2 capsules (200 mg total) by mouth 3 (three) times daily as needed for cough.   BUDESON-GLYCOPYRROL-FORMOTEROL (BREZTRI AEROSPHERE) 160-9-4.8 MCG/ACT AERO    Inhale 2 puffs into the lungs in the morning and at bedtime.   CONTINUOUS BLOOD GLUC SENSOR (FREESTYLE LIBRE 14 DAY SENSOR) MISC    USE TO TEST BLOOD SUGAR THREE TIMES DAILY. E11.51. E11.59   DIPHENHYDRAMINE-ACETAMINOPHEN (TYLENOL PM) 25-500 MG TABS TABLET  Take 1 tablet by mouth at bedtime as needed.   FLUTICASONE (FLONASE) 50 MCG/ACT NASAL SPRAY    Place 1 spray into both nostrils daily as needed for allergies or rhinitis.   FUROSEMIDE (LASIX) 40 MG TABLET    Take 1 tablet (40 mg total) by mouth as directed for 3 days.   GABAPENTIN (NEURONTIN) 300 MG CAPSULE    Take 3 capsules (900 mg total) by mouth at bedtime. G25.81   GLUCOSE 4 GM CHEWABLE TABLET    Chew 1 tablet (4 g total) by mouth as needed for low blood sugar.   INSULIN ASPART (FIASP FLEXTOUCH) 100 UNIT/ML FLEXTOUCH PEN    Inject 15-25 Units into the skin 3 (three) times daily before meals.   INSULIN GLARGINE (BASAGLAR KWIKPEN) 100 UNIT/ML    Inject 40-50 Units into the skin at bedtime.   INSULIN PEN NEEDLE (BD PEN NEEDLE NANO 2ND GEN) 32G X 4 MM MISC    USE AS DIRECTED   LIFESCAN FINEPOINT LANCETS MISC    Use to test for blood sugar three times daily dx: E11.51   METOPROLOL SUCCINATE (TOPROL-XL) 25 MG 24 HR TABLET    TAKE 1 TABLET BY MOUTH EVERY DAY WITH OR IMMEDIATELY FOLLOWING A MEAL   NITROGLYCERIN (NITROSTAT) 0.4 MG SL TABLET    PLACE 1 TABLET UNDER THE TONGUE EVERY 5 MINUTES AS NEEDED FOR CHEST PAIN.   ONETOUCH ULTRA TEST STRIP    USE TO TEST FOR BLOOD SUGAR THREE TIMES DAILY DX: E11.51   PANTOPRAZOLE (PROTONIX) 40 MG TABLET    TAKE ONE TABLET BY MOUTH ONCE DAILY FOR STOMACH   PRALUENT 150 MG/ML SOAJ    INJECT 1 PEN INTO THE SKIN EVERY 14 (FOURTEEN) DAYS.   PRAMIPEXOLE (MIRAPEX) 0.5 MG TABLET    Take  0.25-0.5 mg by mouth as needed.   TIZANIDINE (ZANAFLEX) 4 MG TABLET    Take 1 tablet (4 mg total) by mouth every 8 (eight) hours as needed.   VALSARTAN-HYDROCHLOROTHIAZIDE (DIOVAN HCT) 160-25 MG TABLET    Take 1 tablet by mouth daily.  Modified Medications   No medications on file  Discontinued Medications   No medications on file    Physical Exam:  Vitals:   04/28/23 0815  BP: 138/72  Pulse: 67  Resp: 19  Temp: 97.8 F (36.6 C)  SpO2: 97%  Weight: 211 lb 12.8 oz (96.1 kg)  Height: 5\' 8"  (1.727 m)   Body mass index is 32.2 kg/m. Wt Readings from Last 3 Encounters:  04/28/23 211 lb 12.8 oz (96.1 kg)  04/25/23 209 lb (94.8 kg)  04/03/23 200 lb 3.2 oz (90.8 kg)    Physical Exam Constitutional:      General: He is not in acute distress.    Appearance: He is well-developed. He is not diaphoretic.  HENT:     Head: Normocephalic and atraumatic.     Right Ear: External ear normal.     Left Ear: External ear normal.     Mouth/Throat:     Pharynx: No oropharyngeal exudate.  Eyes:     Conjunctiva/sclera: Conjunctivae normal.     Pupils: Pupils are equal, round, and reactive to light.  Cardiovascular:     Rate and Rhythm: Normal rate and regular rhythm.     Heart sounds: Normal heart sounds.  Pulmonary:     Effort: Pulmonary effort is normal.     Breath sounds: Normal breath sounds.  Abdominal:     General: Bowel sounds are normal.  Palpations: Abdomen is soft.  Musculoskeletal:        General: No tenderness.     Cervical back: Normal range of motion and neck supple.     Right lower leg: Edema (1+) present.     Left lower leg: Edema (1+) present.  Skin:    General: Skin is warm and dry.  Neurological:     Mental Status: He is alert and oriented to person, place, and time.  Psychiatric:        Mood and Affect: Mood normal.     Labs reviewed: Basic Metabolic Panel: Recent Labs    07/22/22 0903 12/23/22 2353 03/20/23 0809  NA 141 133* 135  K 4.9 4.6 5.1   CL 104 98 97*  CO2 29 26 29   GLUCOSE 154* 139* 201*  BUN 18 39* 26*  CREATININE 1.15 1.62* 1.03  CALCIUM 9.5 9.5 9.9   Liver Function Tests: Recent Labs    07/22/22 0903 03/20/23 0809  AST 25 14  ALT 38 22  BILITOT 0.6 0.7  PROT 6.7 6.8   No results for input(s): "LIPASE", "AMYLASE" in the last 8760 hours. No results for input(s): "AMMONIA" in the last 8760 hours. CBC: Recent Labs    07/22/22 0903 12/24/22 0335 03/20/23 0809  WBC 4.8 9.1 10.0  NEUTROABS 2,894  --  8,910*  HGB 15.2 15.7 13.8  HCT 44.5 46.6 42.0  MCV 89.5 89.8 94.0  PLT 124* 124* 162   Lipid Panel: Recent Labs    03/20/23 0809  CHOL 186  HDL 48  LDLCALC 114*  TRIG 125  CHOLHDL 3.9   TSH: No results for input(s): "TSH" in the last 8760 hours. A1C: Lab Results  Component Value Date   HGBA1C 9.5 (A) 04/03/2023     Assessment/Plan Assessment and Plan    Lumbar Disc Herniation Extrusion of the third lumbar disc causing right leg weakness. Surgery pending medical clearance. -Continue current management plan.  Poorly Controlled Diabetes A1c of 9.5, needs to be reduced to 7.5 or less for surgical clearance. Discussed the importance of diet and consistent blood sugar monitoring. -Reach out to endocrinologist for further management. -Refer to dietitian for dietary counseling. -Check morning blood glucose levels consistently. -Target A1c of 7.5 or less for surgical clearance.  Cardiovascular Risk High risk for surgery as per cardiology assessment. Pending further cardiac tests. -Continue current management plan.  Lower Extremity Edema Recent onset, possibly related to decreased mobility due to back pain. Improvement noted with diuretic use. -Continue as-needed use of Lasix for edema. -Consider use of compression stockings.  Constipation Occasional constipation reported, resolving spontaneously. -Continue current management plan.  Urinary Frequency Increased urinary frequency  reported, possibly related to diuretic use. -Continue current management plan.  Headaches Occasional headaches, possibly related to caffeine withdrawal. -Continue current management plan.  Weight Gain Recent weight gain likely due to decreased mobility from back pain. Discussed the increased surgical risk with weight gain. -Encourage diet modification and weight loss prior to surgery.       *** Type 2 diabetes mellitus with hyperglycemia, with long-term current use of insulin (HCC) -     Amb ref to Medical Nutrition Therapy-MNT     No follow-ups on file.: ***  [name] K. Biagio Borg [facility] [phone]

## 2023-04-30 ENCOUNTER — Ambulatory Visit: Payer: Medicare Other | Admitting: Physician Assistant

## 2023-05-08 ENCOUNTER — Other Ambulatory Visit: Payer: Self-pay | Admitting: Internal Medicine

## 2023-05-08 NOTE — Telephone Encounter (Signed)
 Pt insurance does not cover Basaglar, please advise on alternative

## 2023-05-08 NOTE — Telephone Encounter (Signed)
 J, We can send the same dose of Lantus, or Semglee, but if these are not covered, we can try [place]. Ty! C

## 2023-05-09 ENCOUNTER — Other Ambulatory Visit: Payer: Self-pay | Admitting: Internal Medicine

## 2023-05-09 MED ORDER — LYUMJEV KWIKPEN 200 UNIT/ML ~~LOC~~ SOPN: PEN_INJECTOR | SUBCUTANEOUS | 3 refills | Status: DC

## 2023-05-09 NOTE — Telephone Encounter (Signed)
 Pt notified of insulin change but now he stated to me that his FiAsp is no longer being covered. Which alternative would be best for him?

## 2023-05-15 ENCOUNTER — Ambulatory Visit (INDEPENDENT_AMBULATORY_CARE_PROVIDER_SITE_OTHER): Payer: Medicare Other

## 2023-05-15 DIAGNOSIS — R600 Localized edema: Secondary | ICD-10-CM

## 2023-05-15 LAB — ECHOCARDIOGRAM COMPLETE
AV Vena cont: 0.31 cm
Area-P 1/2: 3.08 cm2
Calc EF: 56.5 %
P 1/2 time: 440 ms
S' Lateral: 2.48 cm
Single Plane A2C EF: 56.7 %
Single Plane A4C EF: 53.2 %

## 2023-05-19 NOTE — Progress Notes (Unsigned)
 Patient ID: Kenneth Pitstick Sr., male   DOB: 12/05/1950, 73 y.o.   MRN: [id number]  HPI: Kenneth Tromp Sr. is a 73 y.o.-year-old male, returning for follow-up for DM2, dx in 2016, insulin-dependent  since 2020, uncontrolled, with complications (CAD, history of CVA, CKD stage III). Pt. previously saw Dr. Everardo All, but last visit with me 4 months ago.  He is here with his wife who offers part of the history especially regarding his activity and past medical history.  Interim history: No increased urination, nausea, chest pain.  He has bothersome restless legs. He has significant back, hip, and knee pain and he recently had 2 steroid inj's, latest: last week.  Sugars increased significantly afterwards.  She continues to see a Land.  The manipulations helped, but the effect is short-lived.  Reviewed HbA1c: Lab Results  Component Value Date   HGBA1C 9.5 (A) 04/03/2023   HGBA1C 8.4 (A) 12/06/2022   HGBA1C 9.1 (H) 07/22/2022   HGBA1C 7.8 (A) 02/28/2022   HGBA1C 8.1 (H) 10/05/2021   HGBA1C 8.3 (A) 05/07/2021   HGBA1C 7.9 (H) 01/15/2021   HGBA1C 8.1 (A) 12/25/2020   HGBA1C 8.6 (A) 09/14/2020   HGBA1C 8.7 (H) 06/30/2020   At last visit he was on: - Basaglar 35 >> 30 units at bedtime - Novolog:  (Taking it mostly after the meal) 10-20 units before B 10-15 units before L 15-20 unit before D He stopped Ozempic 0.5 mg weekly >> nausea, could not function. He had nausea with metformin. He also had nausea with Trulicity.  We changed to: - Basaglar 36 >> actually taking only 30 units daily - Fiasp 10-20 >> 30 units before meals - Fiasp sliding scale:  150-175: +4 176-200: +6 201-250: +10 251-300: +14 >300: +18  Pt checks his sugars >4 a day with his CGM:  Previously:  Previously:   Lowest sugar was 46 >> 54 >> 50s >> 64; he has hypoglycemia awareness at 70.  Highest sugar was 400 >> 350 >> 400 >> HI.  Glucometer: One Touch ultra  - + CKD, last BUN/creatinine:  Lab Results   Component Value Date   BUN 26 (H) 03/20/2023   BUN 39 (H) 12/23/2022   CREATININE 1.03 03/20/2023   CREATININE 1.62 (H) 12/23/2022   Lab Results  Component Value Date   MICRALBCREAT 12 07/22/2022   MICRALBCREAT 20 03/06/2020   MICRALBCREAT SEE NOTE 08/27/2016   MICRALBCREAT <10.2 08/26/2013  On Diovan 80 mg daily.  -+HL; last set of lipids: Lab Results  Component Value Date   CHOL 186 03/20/2023   HDL 48 03/20/2023   LDLCALC 114 (H) 03/20/2023   LDLDIRECT 110 (H) 06/17/2019   TRIG 125 03/20/2023   CHOLHDL 3.9 03/20/2023  He is on Praluent.  He had side effects from statins in the past.  - last eye exam was 11/13/2022. No DR reportedly.   - +  restless leg syndrome.  He is on Neurontin. Also, on pramipexole for restless leg syndrome.  He sees neurology.  Last foot exam 12/06/2022.  He also has a history of HTN, GERD, Barrett's esophagus, iron deficiency anemia, memory loss.  ROS: + see HPI  Past Medical History:  Diagnosis Date   Arthritis    "left thumb" (05/23/2017) right shoulder   Barrett's esophagus    "we've been told that it's all gone; still take RX for GERD" (05/23/2017)   Bilateral swelling of feet and ankles x 3 weeks as of 12-01-2019   CAD (coronary artery disease), native  coronary artery    Coronary angiogram 05/03/2014:  Proximal LAD 3.0x12 mm Promus premier stent.  04/08/2014: Mid Cx 3.5 x 16 mm Promus DES.  03/29/2013: Mid LAD 2.75 x 38 mm Promus Premier drug-eluting stent, balloon angioplasty of D1 and distal LAD and stenting of distal RCA with 2.75 x 24 mm promos Premier drug-eluting stent 12/15/2012 widely patent.   Cervicalgia    Chronic bronchitis (HCC)    "q yr" (05/23/2017)   Complication of anesthesia    " I do not wake up very well "  slow to wake up   COVID-19 05/2019   all covid symptoms in hospital x 5 days, all symptoms resolved took monoclonal antibody tx    Dyspnea    with exertion   Family history of adverse reaction to anesthesia    "son  w/PONV"   GERD (gastroesophageal reflux disease)    Heart murmur    "grew out of it"    Hyperlipidemia    Hypertension    Hypoglycemia, unspecified    IDDM (insulin dependent diabetes mellitus)    type 2   Impotence of organic origin    Iron deficiency anemia    Memory loss    mild   Other malaise and fatigue    Pneumonia 05/2019   PONV (postoperative nausea and vomiting)    after wisdom teeth pulled, no problems with other surgeries   Restless leg    Rotator cuff arthropathy    Stroke (HCC)    Tension headache    "sometimes" (05/23/2017)   Unspecified gastritis and gastroduodenitis with hemorrhage    Unstable angina pectoris (HCC) 04/08/2014   Coronary angiogram 05/03/2014:  Proximal LAD 3.0x12 mm Promus premier stent.  04/08/2014: Mid Cx 3.5 x 16 mm Promus DES.  03/29/2013: Mid LAD 2.75 x 38 mm Promus Premier drug-eluting stent, balloon angioplasty of D1 and distal LAD and stenting of distal RCA with 2.75 x 24 mm promos Premier drug-eluting stent 12/15/2012 widely patent.   Past Surgical History:  Procedure Laterality Date   BICEPT TENODESIS Right 12/07/2019   Procedure: RIGHT BICEPS TENODESIS;  Surgeon: Sheral Apley, MD;  [facility];  Service: Orthopedics;  Laterality: Right;   CARDIAC CATHETERIZATION N/A 09/25/2015   Procedure: Left Heart Cath and Coronary Angiography;  Surgeon: [name], MD;  Location: [facility] INVASIVE CV LAB;  Service: Cardiovascular;  Laterality: N/A;   CARDIAC CATHETERIZATION N/A 09/25/2015   Procedure: Intravascular Pressure Wire/FFR Study;  Surgeon: [name], MD;  Location: [facility] INVASIVE CV LAB;  Service: Cardiovascular;  Laterality: N/A;   CORONARY ANGIOPLASTY WITH STENT PLACEMENT  12/15/2012; 04/28/2014; 05/03/2014   "2; 1; 1" , 4 stents 2 balloons   CORONARY STENT INTERVENTION N/A 05/01/2021   Procedure: CORONARY STENT INTERVENTION;  Surgeon: [name], MD;  Location: [facility] INVASIVE CV LAB;  Service: Cardiovascular;  Laterality: N/A;    FRACTIONAL FLOW RESERVE WIRE Right 03/26/2013   Procedure: FRACTIONAL FLOW RESERVE WIRE;  Surgeon: Pamella Pert, MD;  Location: [facility] CATH LAB;  Service: Cardiovascular;  Laterality: Right;   FRACTIONAL FLOW RESERVE WIRE N/A 05/03/2014   Procedure: FRACTIONAL FLOW RESERVE WIRE;  Surgeon: Pamella Pert, MD;  Location: [facility] CATH LAB;  Service: Cardiovascular;  Laterality: N/A;   HERNIA REPAIR  2010   "umbilical"    INCISION AND DRAINAGE ABSCESS Left 05/2007   "groin"    KNEE SURGERY Right 1992;  2000   "calcium deposits removed" (12/15/2012)   LEFT HEART CATH AND CORONARY ANGIOGRAPHY  N/A 05/23/2017   Procedure: LEFT HEART CATH AND CORONARY ANGIOGRAPHY;  Surgeon: [name], MD;  Location: [facility] INVASIVE CV LAB;  Service: Cardiovascular;  Laterality: N/A;   LEFT HEART CATH AND CORONARY ANGIOGRAPHY N/A 05/01/2021   Procedure: LEFT HEART CATH AND CORONARY ANGIOGRAPHY;  Surgeon: [name], MD;  Location: [facility] INVASIVE CV LAB;  Service: Cardiovascular;  Laterality: N/A;   LEFT HEART CATHETERIZATION WITH CORONARY ANGIOGRAM N/A 12/15/2012   Procedure: LEFT HEART CATHETERIZATION WITH CORONARY ANGIOGRAM;  Surgeon: Pamella Pert, MD;  Location: [facility] CATH LAB;  Service: Cardiovascular;  Laterality: N/A;   LEFT HEART CATHETERIZATION WITH CORONARY ANGIOGRAM N/A 03/26/2013   Procedure: LEFT HEART CATHETERIZATION WITH CORONARY ANGIOGRAM;  Surgeon: Pamella Pert, MD;  Location: [facility] CATH LAB;  Service: Cardiovascular;  Laterality: N/A;   LEFT HEART CATHETERIZATION WITH CORONARY ANGIOGRAM N/A 04/08/2014   Procedure: LEFT HEART CATHETERIZATION WITH CORONARY ANGIOGRAM;  Surgeon: Micheline Chapman, MD;  Location: [facility] CATH LAB;  Service: Cardiovascular;  Laterality: N/A;   MOUTH SURGERY Right    PERCUTANEOUS CORONARY STENT INTERVENTION (PCI-S) N/A 05/03/2014   Procedure: PERCUTANEOUS CORONARY STENT INTERVENTION (PCI-S);  Surgeon: Pamella Pert, MD;  Location: [facility] CATH LAB;  Service: Cardiovascular;   Laterality: N/A;   RADIOLOGY WITH ANESTHESIA N/A 02/08/2021   Procedure: MRI WITH ANESTHESIA OF ABDOMEN WITH AND WITHOUT CONSTRAST;  Surgeon: Radiologist, Medication, MD;  Location: [facility] OR;  Service: Radiology;  Laterality: N/A;   ROTATOR CUFF REPAIR  2022   UMBILICAL HERNIA REPAIR  yrs ago   Social History   Socioeconomic History   Marital status: Married    Spouse name: Pam   Number of children: 2   Years of education: 12   Highest education level: High school graduate  Occupational History   Occupation: Naval architect    Comment: deliveries only now   Occupation: woodworking  Tobacco Use   Smoking status: Never   Smokeless tobacco: Never  Vaping Use   Vaping status: Never Used  Substance and Sexual Activity   Alcohol use: No   Drug use: No   Sexual activity: Not Currently  Other Topics Concern   Not on file  Social History Narrative   Right handed    Caffeine 2 cups daily   Winter time 4 cups daily   Lives family one level home    Semi Retired   Social Drivers of Corporate investment banker Strain: Low Risk  (01/18/2021)   Overall Financial Resource Strain (CARDIA)    Difficulty of Paying Living Expenses: Not very hard  Food Insecurity: No Food Insecurity (01/18/2021)   Hunger Vital Sign    Worried About Running Out of Food in the Last Year: Never true    Ran Out of Food in the Last Year: Never true  Transportation Needs: No Transportation Needs (01/18/2021)   PRAPARE - Administrator, Civil Service (Medical): No    Lack of Transportation (Non-Medical): No  Physical Activity: Inactive (04/25/2017)   Exercise Vital Sign    Days of Exercise per Week: 0 days    Minutes of Exercise per Session: 0 min  Stress: No Stress Concern Present (04/25/2017)   [employer] of Occupational Health - Occupational Stress Questionnaire    Feeling of Stress : Only a little  Social Connections: Moderately Integrated (04/25/2017)   Social Connection and Isolation Panel  [NHANES]    Frequency of Communication with Friends and Family: More than three times a week    Frequency  of Social Gatherings with Friends and Family: More than three times a week    Attends Religious Services: More than 4 times per year    Active Member of [employer] or Organizations: No    Attends Banker Meetings: Never    Marital Status: Married  Catering manager Violence: Not At Risk (04/25/2017)   Humiliation, Afraid, Rape, and Kick questionnaire    Fear of Current or Ex-Partner: No    Emotionally Abused: No    Physically Abused: No    Sexually Abused: No   Current Outpatient Medications on File Prior to Visit  Medication Sig Dispense Refill   acetaminophen (TYLENOL) 500 MG tablet Take 500 mg by mouth every 6 (six) hours as needed for mild pain, moderate pain, headache or fever.     albuterol (VENTOLIN HFA) 108 (90 Base) MCG/ACT inhaler TAKE 2 PUFFS BY MOUTH EVERY 6 HOURS AS NEEDED FOR WHEEZE OR SHORTNESS OF BREATH 6.7 each 2   aspirin EC 81 MG tablet Take 1 tablet (81 mg total) by mouth daily. Swallow whole. 90 tablet 3   benzonatate (TESSALON PERLES) 100 MG capsule Take 2 capsules (200 mg total) by mouth 3 (three) times daily as needed for cough. 30 capsule 0   Budeson-Glycopyrrol-Formoterol (BREZTRI AEROSPHERE) 160-9-4.8 MCG/ACT AERO Inhale 2 puffs into the lungs in the morning and at bedtime. 5.9 g 0   Continuous Blood Gluc Sensor (FREESTYLE LIBRE 14 DAY SENSOR) MISC USE TO TEST BLOOD SUGAR THREE TIMES DAILY. E11.51. E11.59 1 each 12   diphenhydramine-acetaminophen (TYLENOL PM) 25-500 MG TABS tablet Take 1 tablet by mouth at bedtime as needed.     fluticasone (FLONASE) 50 MCG/ACT nasal spray Place 1 spray into both nostrils daily as needed for allergies or rhinitis. 16 g 11   furosemide (LASIX) 40 MG tablet Take 1 tablet (40 mg total) by mouth as directed for 3 days. 15 tablet 0   gabapentin (NEURONTIN) 300 MG capsule Take 3 capsules (900 mg total) by mouth at bedtime.  G25.81 270 capsule 0   glucose 4 GM chewable tablet Chew 1 tablet (4 g total) by mouth as needed for low blood sugar. 50 tablet 12   insulin glargine (LANTUS) 100 UNIT/ML Solostar Pen Inject 40-50 Units into the skin daily. Inject 40-50 Units into the skin at bedtime. 45 mL 2   Insulin Lispro-aabc (LYUMJEV KWIKPEN) 200 UNIT/ML KwikPen Inject 15-25 Units into the skin 3 (three) times daily before meals. 12 mL 3   Insulin Pen Needle (BD PEN NEEDLE NANO 2ND GEN) 32G X 4 MM MISC USE AS DIRECTED 100 each 3   LIFESCAN FINEPOINT LANCETS MISC Use to test for blood sugar three times daily dx: E11.51 300 each 1   metoprolol succinate (TOPROL-XL) 25 MG 24 hr tablet TAKE 1 TABLET BY MOUTH EVERY DAY WITH OR IMMEDIATELY FOLLOWING A MEAL 90 tablet 1   nitroGLYCERIN (NITROSTAT) 0.4 MG SL tablet PLACE 1 TABLET UNDER THE TONGUE EVERY 5 MINUTES AS NEEDED FOR CHEST PAIN. 50 tablet 2   ONETOUCH ULTRA test strip USE TO TEST FOR BLOOD SUGAR THREE TIMES DAILY DX: E11.51 300 strip 11   pantoprazole (PROTONIX) 40 MG tablet TAKE ONE TABLET BY MOUTH ONCE DAILY FOR STOMACH 90 tablet 3   PRALUENT 150 MG/ML SOAJ INJECT 1 PEN INTO THE SKIN EVERY 14 (FOURTEEN) DAYS. 6 mL 1   pramipexole (MIRAPEX) 0.5 MG tablet Take 0.25-0.5 mg by mouth as needed.     tiZANidine (ZANAFLEX) 4 MG tablet Take  1 tablet (4 mg total) by mouth every 8 (eight) hours as needed. 30 tablet 11   valsartan-hydrochlorothiazide (DIOVAN HCT) 160-25 MG tablet Take 1 tablet by mouth daily. 90 tablet 3   Current Facility-Administered Medications on File Prior to Visit  Medication Dose Route Frequency Provider Last Rate Last Admin   [COMPLETED] hydrALAZINE (APRESOLINE) injection 10 mg  10 mg Intravenous Once [name], MD   10 mg at 05/01/21 1054   Allergies  Allergen Reactions   Amlodipine Other (See Comments)    Worsening restless legs, dyspnea and leg swelling   Lyrica [Pregabalin] Other (See Comments)    Made patient very lethargic the next morning, very  hard to patient to function, move, etc.    Other Other (See Comments)    Convulsions, Causes Restless Legs, rhabdomyolysis   Statins Other (See Comments)    Causes Restless Legs, rhabdomyolysis   Ativan [Lorazepam] Other (See Comments)    Markedly increased restless legs   Trulicity [Dulaglutide] Other (See Comments)    GI side effects    Family History  Adopted: Yes  Problem Relation Age of Onset   Emphysema Mother    PE: There were no vitals taken for this visit. Wt Readings from Last 10 Encounters:  04/28/23 211 lb 12.8 oz (96.1 kg)  04/25/23 209 lb (94.8 kg)  04/03/23 200 lb 3.2 oz (90.8 kg)  03/17/23 203 lb (92.1 kg)  01/29/23 199 lb 9.6 oz (90.5 kg)  01/29/23 200 lb (90.7 kg)  12/23/22 204 lb (92.5 kg)  12/09/22 204 lb (92.5 kg)  12/06/22 202 lb 6.4 oz (91.8 kg)  11/06/22 202 lb (91.6 kg)   Constitutional: overweight, in NAD, in wheelchair Eyes: no exophthalmos ENT: no thyromegaly, no cervical lymphadenopathy Cardiovascular: RRR, No MRG Respiratory: CTA B Musculoskeletal: no deformities Skin:  no rashes Neurological: no tremor with outstretched hands  ASSESSMENT: 1. DM2, insulin-dependent, uncontrolled, with complications - CAD - h/o CVA - CKD stage III - severe hypoglycemia in 2019  2. HL  PLAN:  1. Patient with longstanding, uncontrolled, type 2 diabetes, on basal/bolus insulin regimen, with still poor control.  He was previously on a weekly GLP-1 receptor agonist but this was stopped due to nausea.  HbA1c at last visit was slightly lower, at 8.4%, but lower than expected from the CGM.  Sugars appears to be much higher than before, mostly above target with significant increases after meals and largest increase after lunch and dinner.  He was traveling and not able to follow a strict diet and he was taking NovoLog mostly after he was eating.  I recommended to switch to [facility] and to vary the dose of the mealtime insulin based on the size of the meal.  I also gave  him a sliding scale.  We increased Basaglar dose.  I strongly advised him to try to improve diet. CGM interpretation: -At today's visit, we reviewed his CGM downloads: It appears that 13% of values are in target range (goal >70%), while 87% are higher than 180 (goal <25%), and 0% are lower than 70 (goal <4%).  The calculated average blood sugar is 295.  The projected HbA1c for the next 3 months (GMI) is 10.4%. -Reviewing the CGM trends, sugars are very high at all times of the day, without a consistent pattern.  He mentions that sugars increase significantly after his steroid injections and have not improved despite the fact that he contacted [place] about the blood sugars and we advised to increase Fiasp doses.  He is taking an almost double dose of Fiasp now but he forgot about increasing the dose of Basaglar and he is actually taking less than recommended at last visit.  Since the sugars are uniform throughout the day, I advised him to increase his Basaglar dose and continue to go up until sugars decreased to the target range.  He is not planning to get any more steroid injections for now but may need knee/back surgery.  I did advise him that his blood sugars need to be much better controlled to be able to go on with the surgery. -He describes having had a low blood sugar last night, in the 60s.  Upon review of his CGM data, he drops his blood sugars probably over night.  Upon questioning, he corrected hyperglycemia with 32 units of insulin despite advised not to use more than 18.  I again underlined the need to adjust his Fiasp sliding scale. - I suggested to:  Patient Instructions  Please increase: - Basaglar 40 units daily (continue to increase by 5 units every 3 days until sugars stabilize within the target range  Continue: - Fiasp 30 units before meals  Use: - Fiasp sliding scale:  150-175: +4 176-200: +6 201-250: +10 251-300: +14 >300: +18  Please return in 1.5-2 months.  - we checked his  HbA1c: 9.5% (higher) - advised to check sugars at different times of the day - 4x a day, rotating check times - advised for yearly eye exams >> he is UTD - return to clinic in 1.5-2 months  2. HL -Latest lipid panel from 03/2023 showed LDL above target, otherwise fractions at goal: Lab Results  Component Value Date   CHOL 186 03/20/2023   HDL 48 03/20/2023   LDLCALC 114 (H) 03/20/2023   LDLDIRECT 110 (H) 06/17/2019   TRIG 125 03/20/2023   CHOLHDL 3.9 03/20/2023  -He is on Praluent, without side effects.  He previously had side effects from statins: Restless legs and rhabdomyolysis.  Carlus Pavlov, MD PhD [facility] Endocrinology

## 2023-05-20 ENCOUNTER — Ambulatory Visit (INDEPENDENT_AMBULATORY_CARE_PROVIDER_SITE_OTHER): Admitting: Internal Medicine

## 2023-05-20 ENCOUNTER — Encounter: Payer: Self-pay | Admitting: Internal Medicine

## 2023-05-20 VITALS — BP 140/70 | HR 74 | Ht 68.0 in | Wt 209.6 lb

## 2023-05-20 DIAGNOSIS — E785 Hyperlipidemia, unspecified: Secondary | ICD-10-CM | POA: Diagnosis not present

## 2023-05-20 DIAGNOSIS — E1159 Type 2 diabetes mellitus with other circulatory complications: Secondary | ICD-10-CM

## 2023-05-20 DIAGNOSIS — E1165 Type 2 diabetes mellitus with hyperglycemia: Secondary | ICD-10-CM | POA: Diagnosis not present

## 2023-05-20 DIAGNOSIS — Z794 Long term (current) use of insulin: Secondary | ICD-10-CM

## 2023-05-20 LAB — POCT GLYCOSYLATED HEMOGLOBIN (HGB A1C): Hemoglobin A1C: 7.5 % — AB (ref 4.0–5.6)

## 2023-05-20 LAB — GLUCOSE, POCT (MANUAL RESULT ENTRY): POC Glucose: 205 mg/dL — AB (ref 70–99)

## 2023-05-20 NOTE — Patient Instructions (Addendum)
 Please continue: - Basaglar 30 units daily - Fiasp 10-30 units before meals - Fiasp sliding scale:  150-175: +4 176-200: +6 201-250: +10 251-300: +14 >300: +18  If you have to take the insulin after a meal, take only 50% of the dose.  No NovoLog at bedtime.  Please return in 2-3 months.

## 2023-05-21 ENCOUNTER — Telehealth: Payer: Self-pay

## 2023-05-21 ENCOUNTER — Encounter: Payer: Self-pay | Admitting: Internal Medicine

## 2023-05-21 LAB — MICROALBUMIN / CREATININE URINE RATIO
Creatinine, Urine: 79 mg/dL (ref 20–320)
Microalb Creat Ratio: 9 mg/g{creat} (ref <30)
Microalb, Ur: 0.7 mg/dL

## 2023-05-21 NOTE — Telephone Encounter (Signed)
   Pre-operative Risk Assessment    Patient Name: Kenneth Calcaterra Sr.  DOB: 04/13/1950 MRN: [id number]   Date of last office visit: 04/25/2023 [facility] NICOLE DUKE PA Date of next office visit: NONE   Request for Surgical Clearance    Procedure:   L3-4 EXTRAFORAMINAL MICRODISKECTOMY  Date of Surgery:  Clearance TBD                                Surgeon:  Mariam Dollar Surgeon's Group or Practice Name:  [facility] Phone number:  [phone] Fax number:  [phone]   Type of Clearance Requested:   - Medical  - Pharmacy:  Hold Aspirin     Type of Anesthesia:  General    Additional requests/questions:    SignedMarlow Baars   05/21/2023, 8:52 AM

## 2023-05-21 NOTE — Telephone Encounter (Signed)
 Preoperative team, please forward cardiology note from 04/25/2023.  Patient was cleared for upcoming surgery.  Please forward appointment note to requesting office.  Thank you for your help.  I will remove patient from preoperative pool.  Thomasene Ripple. Beyonce Sawatzky NP-C     05/21/2023, 9:05 AM [facility] [address] Office [phone] Fax [phone]

## 2023-05-21 NOTE — Telephone Encounter (Signed)
 I will fax notes per Edd Fabian, FNP

## 2023-05-25 ENCOUNTER — Encounter: Payer: Self-pay | Admitting: Pulmonary Disease

## 2023-05-28 ENCOUNTER — Encounter: Payer: Self-pay | Admitting: Neurology

## 2023-05-28 DIAGNOSIS — M5116 Intervertebral disc disorders with radiculopathy, lumbar region: Secondary | ICD-10-CM | POA: Diagnosis not present

## 2023-05-28 DIAGNOSIS — M5126 Other intervertebral disc displacement, lumbar region: Secondary | ICD-10-CM | POA: Diagnosis not present

## 2023-05-28 HISTORY — PX: BACK SURGERY: SHX140

## 2023-05-30 ENCOUNTER — Ambulatory Visit: Payer: Self-pay | Admitting: Nurse Practitioner

## 2023-05-30 NOTE — Telephone Encounter (Signed)
Message routed to PCP Eubanks, Jessica K, NP  

## 2023-05-30 NOTE — Telephone Encounter (Signed)
 Spoke to patient's wife, [name], on behalf of her husband. Patient recently had back surgery and was prescribed methocarbamol and Vicodin. Wife stated that the patient has also been experiencing swelling in his legs for months. Wife stated that PCP is aware of this and had prescribed Lasix in the past. Patient has about 10 pills of Lasix left. Patient would like clarification from his provider on if he can take Lasix in addition to these new medications. Please advise.   Copied from CRM [id number]. Topic: Clinical - Red Word Triage >> May 30, 2023  4:08 PM [name] wrote: Red Word that prompted transfer to Nurse Triage: Patient recently had back surgery on Wednesday and the swelling in his feet and legs is back and has gotten bad. Was prescribed a fluid pill a while ago from NP Abbey Chatters and wants to know if it's okay to take it. Reason for Disposition  [1] Caller requesting NON-URGENT health information AND [2] PCP's office is the best resource  Protocols used: Information Only Call - No Triage-A-AH

## 2023-05-30 NOTE — Telephone Encounter (Signed)
 Yes okay to take with medication given post surgery. Looks like cardiology prescribed in the past. Should make them aware of the swelling too could be related to his heart failure.

## 2023-06-02 ENCOUNTER — Telehealth: Payer: Self-pay | Admitting: Cardiology

## 2023-06-02 NOTE — Telephone Encounter (Signed)
 Pt c/o swelling/edema: STAT if pt has developed SOB within 24 hours  If swelling, where is the swelling located? Leg and feet swelling really bad  How much weight have you gained and in what time span?  6lbs in a 5 days  Have you gained 2 pounds in a day or 5 pounds in a week?   Do you have a log of your daily weights (if so, list)?   Are you currently taking a fluid pill?  yes  Are you currently SOB? no  Have you traveled recently in a car or plane for an extended period of time? no

## 2023-06-02 NOTE — Telephone Encounter (Signed)
 Patient called and notified. Message routed to Cardiologist as well.

## 2023-06-02 NOTE — Telephone Encounter (Signed)
 Spoke with patient and he states his legs and feet are really swollen last week he weighed 204 today he weighs 210. He had lasix at home so he started taking it yesterday. He felt it did not help because he is more swollen today. He would like to increase lasix? Informed patient that I will speak with DOD and then give him a call back  Spoke with Dr. Elberta Fortis he advised patient can take 80 mg for the next 3 days and if he still have swelling he will have to be seen.  Spoke with patient and he will give [place] a call in 3 days if he still have swelling. He will weigh daily.

## 2023-06-02 NOTE — Telephone Encounter (Signed)
 Would go ahead and make plans to see him in a week or 10 days

## 2023-06-03 NOTE — Telephone Encounter (Signed)
 I spoke with patient and scheduled him to see Dr [name] on 4/7 at 2:40

## 2023-06-03 NOTE — Telephone Encounter (Signed)
 Pt returning call. Please advise.

## 2023-06-03 NOTE — Telephone Encounter (Signed)
 Left message to call office

## 2023-06-04 ENCOUNTER — Ambulatory Visit: Payer: Medicare Other | Admitting: Internal Medicine

## 2023-06-06 ENCOUNTER — Ambulatory Visit: Payer: Medicare Other | Admitting: Dietician

## 2023-06-09 ENCOUNTER — Encounter: Payer: Self-pay | Admitting: Cardiology

## 2023-06-09 ENCOUNTER — Ambulatory Visit: Attending: Cardiology | Admitting: Cardiology

## 2023-06-09 VITALS — BP 138/70 | HR 65 | Resp 16 | Ht 68.0 in | Wt 213.8 lb

## 2023-06-09 DIAGNOSIS — E7800 Pure hypercholesterolemia, unspecified: Secondary | ICD-10-CM | POA: Insufficient documentation

## 2023-06-09 DIAGNOSIS — T466X5D Adverse effect of antihyperlipidemic and antiarteriosclerotic drugs, subsequent encounter: Secondary | ICD-10-CM

## 2023-06-09 DIAGNOSIS — I25118 Atherosclerotic heart disease of native coronary artery with other forms of angina pectoris: Secondary | ICD-10-CM | POA: Insufficient documentation

## 2023-06-09 DIAGNOSIS — I10 Essential (primary) hypertension: Secondary | ICD-10-CM | POA: Insufficient documentation

## 2023-06-09 DIAGNOSIS — T466X5A Adverse effect of antihyperlipidemic and antiarteriosclerotic drugs, initial encounter: Secondary | ICD-10-CM | POA: Diagnosis not present

## 2023-06-09 DIAGNOSIS — G720 Drug-induced myopathy: Secondary | ICD-10-CM | POA: Diagnosis not present

## 2023-06-09 DIAGNOSIS — I5033 Acute on chronic diastolic (congestive) heart failure: Secondary | ICD-10-CM | POA: Insufficient documentation

## 2023-06-09 MED ORDER — FUROSEMIDE 40 MG PO TABS: 40.0000 mg | ORAL_TABLET | Freq: Every day | ORAL | 1 refills | Status: DC

## 2023-06-09 MED ORDER — DAPAGLIFLOZIN PROPANEDIOL 10 MG PO TABS: 10.0000 mg | ORAL_TABLET | Freq: Every day | ORAL | 2 refills | Status: DC

## 2023-06-09 MED ORDER — DAPAGLIFLOZIN PROPANEDIOL 10 MG PO TABS: 10.0000 mg | ORAL_TABLET | Freq: Every day | ORAL | 0 refills | Status: DC

## 2023-06-09 NOTE — Progress Notes (Signed)
 Cardiology Office Note:  .   Date:  06/09/2023  ID:  Fortino Sic Sr., DOB November 05, 1950, MRN [id number] PCP: [name], NP  [facility] Providers Cardiologist:  [name], MD   History of Present Illness: .   Damarious Holtsclaw Sr. is a 73 y.o. Caucasian male with coronary artery disease, with stents to his LAD, RCA and circumflex, remote balloon angioplasty to distal LAD and diagonal has remained patent, hypertension, hyperlipidemia, GERD, Barrett's esophagus, statin intolerance, uncontrolled diabetes mellitus, severe restless leg syndrome.   Patient had called [place] on 06/02/2023 stating that he has had about 6 pound weight gain and has had significant leg edema and his Lasix dose was increased and recommended outpatient follow-up.  He has noticed gradually worsening dyspnea and also worsening leg edema and abdominal distention.  In spite of taking Lasix, states that he is still having issues with weight gain and dyspnea.  He is accompanied by his wife, underwent microdiscectomy 2 to 3 weeks ago without periprocedural complications.  Labs   Lab Results  Component Value Date   CHOL 186 03/20/2023   HDL 48 03/20/2023   LDLCALC 114 (H) 03/20/2023   LDLDIRECT 110 (H) 06/17/2019   TRIG 125 03/20/2023   CHOLHDL 3.9 03/20/2023   Lab Results  Component Value Date   NA 135 03/20/2023   K 5.1 03/20/2023   CO2 29 03/20/2023   GLUCOSE 201 (H) 03/20/2023   BUN 26 (H) 03/20/2023   CREATININE 1.03 03/20/2023   CALCIUM 9.9 03/20/2023   GFR 51.21 (L) 10/22/2021   EGFR 77 03/20/2023   GFRNONAA 45 (L) 12/23/2022      Latest Ref Rng & Units 03/20/2023    8:09 AM 12/23/2022   11:53 PM 07/22/2022    9:03 AM  BMP  Glucose 65 - 99 mg/dL 409  811  914   BUN 7 - 25 mg/dL 26  39  18   Creatinine 0.70 - 1.28 mg/dL 7.82  9.56  2.13   BUN/Creat Ratio 6 - 22 (calc) 25   SEE NOTE:   Sodium 135 - 146 mmol/L 135  133  141   Potassium 3.5 - 5.3 mmol/L 5.1  4.6  4.9   Chloride 98 - 110 mmol/L 97  98   104   CO2 20 - 32 mmol/L 29  26  29    Calcium 8.6 - 10.3 mg/dL 9.9  9.5  9.5       Latest Ref Rng & Units 03/20/2023    8:09 AM 12/24/2022    3:35 AM 07/22/2022    9:03 AM  CBC  WBC 3.8 - 10.8 Thousand/uL 10.0  9.1  4.8   Hemoglobin 13.2 - 17.1 g/dL 08.6  57.8  46.9   Hematocrit 38.5 - 50.0 % 42.0  46.6  44.5   Platelets 140 - 400 Thousand/uL 162  124  124    Lab Results  Component Value Date   HGBA1C 7.5 (A) 05/20/2023    Lab Results  Component Value Date   TSH 2.684 01/15/2021    Review of Systems  Cardiovascular:  Positive for dyspnea on exertion and leg swelling. Negative for chest pain.  Gastrointestinal:  Positive for bloating.   Physical Exam:   VS:  BP 138/70 (BP Location: Left Arm, Patient Position: Sitting, Cuff Size: Large)   Pulse 65   Resp 16   Ht 5\' 8"  (1.727 m)   Wt 213 lb 12.8 oz (97 kg)   SpO2 95%  BMI 32.51 kg/m    Wt Readings from Last 3 Encounters:  06/09/23 213 lb 12.8 oz (97 kg)  05/20/23 209 lb 9.6 oz (95.1 kg)  04/28/23 211 lb 12.8 oz (96.1 kg)    Physical Exam Neck:     Vascular: JVD present. No carotid bruit.  Cardiovascular:     Rate and Rhythm: Normal rate and regular rhythm.     Pulses:          Dorsalis pedis pulses are 0 on the right side and 0 on the left side.       Posterior tibial pulses are 0 on the right side and 0 on the left side.     Heart sounds: S1 normal and S2 normal. Murmur heard.     Early systolic murmur is present with a grade of 2/6 radiating to the apex.     No gallop.  Pulmonary:     Effort: Pulmonary effort is normal.     Breath sounds: Normal breath sounds.  Abdominal:     General: Bowel sounds are normal. There is distension.     Palpations: Abdomen is soft.  Musculoskeletal:     Right lower leg: Edema (2-3+ above knee pitting edema) present.     Left lower leg: Edema (2-3+ above knee pitting edema) present.    Studies Reviewed: Marland Kitchen    ECHOCARDIOGRAM COMPLETE 05/15/2023  1. Left ventricular ejection  fraction, by estimation, is 55 to 60%. Left ventricular ejection fraction by 2D MOD biplane is 56.5 %. The left ventricle has normal function. The left ventricle has no regional wall motion abnormalities. There is mild left ventricular hypertrophy. Left ventricular diastolic parameters are consistent with Grade I diastolic dysfunction (impaired relaxation). The average left ventricular global longitudinal strain is 14.7 %. The global longitudinal strain is abnormal. 2. Right ventricular systolic function is normal. The right ventricular size is normal. There is normal pulmonary artery systolic pressure. The estimated right ventricular systolic pressure is 16.2 mmHg. 3. The mitral valve is abnormal. Trivial mitral valve regurgitation. 4. The aortic valve is tricuspid. Aortic valve regurgitation is mild. Aortic valve sclerosis/calcification is present, without any evidence of aortic stenosis. Aortic regurgitation PHT measures 440 msec. 5. The inferior vena cava is normal in size with greater than 50% respiratory variability, suggesting right atrial pressure of 3 mmHg.   Comparison(s): Prior images unable to be directly viewed, comparison made by report only. No significant change from prior study. 07/15/2021: LVEF 58%, mild AI.  EKG:   EKG 04/25/2023: Normal sinus rhythm at rate of 65 bpm, left intrafascicular block.  LVH by voltage in aVL.  Compared to 12/23/2022, inferior deep T wave inversion suggestive ischemia not present.  EKG 11/06/2022: Normal sinus rhythm at rate of 60 bpm, left anterior fascicular block. LVH. Compared to 02/08/2022, no significant change.   Medications and allergies    Allergies  Allergen Reactions   Amlodipine Other (See Comments)    Worsening restless legs, dyspnea and leg swelling   Lyrica [Pregabalin] Other (See Comments)    Made patient very lethargic the next morning, very hard to patient to function, move, etc.    Other Other (See Comments)    Convulsions, Causes  Restless Legs, rhabdomyolysis   Statins Other (See Comments)    Causes Restless Legs, rhabdomyolysis   Ativan [Lorazepam] Other (See Comments)    Markedly increased restless legs   Trulicity [Dulaglutide] Other (See Comments)    GI side effects      Current Outpatient  Medications:    acetaminophen (TYLENOL) 500 MG tablet, Take 500 mg by mouth every 6 (six) hours as needed for mild pain, moderate pain, headache or fever., Disp: , Rfl:    albuterol (VENTOLIN HFA) 108 (90 Base) MCG/ACT inhaler, TAKE 2 PUFFS BY MOUTH EVERY 6 HOURS AS NEEDED FOR WHEEZE OR SHORTNESS OF BREATH, Disp: 6.7 each, Rfl: 2   aspirin EC 81 MG tablet, Take 1 tablet (81 mg total) by mouth daily. Swallow whole., Disp: 90 tablet, Rfl: 3   Budeson-Glycopyrrol-Formoterol (BREZTRI AEROSPHERE) 160-9-4.8 MCG/ACT AERO, Inhale 2 puffs into the lungs in the morning and at bedtime., Disp: 5.9 g, Rfl: 0   Continuous Blood Gluc Sensor (FREESTYLE LIBRE 14 DAY SENSOR) MISC, USE TO TEST BLOOD SUGAR THREE TIMES DAILY. E11.51. E11.59, Disp: 1 each, Rfl: 12   dapagliflozin propanediol (FARXIGA) 10 MG TABS tablet, Take 1 tablet (10 mg total) by mouth daily before breakfast., Disp: 30 tablet, Rfl: 2   diphenhydramine-acetaminophen (TYLENOL PM) 25-500 MG TABS tablet, Take 1 tablet by mouth at bedtime as needed., Disp: , Rfl:    fluticasone (FLONASE) 50 MCG/ACT nasal spray, Place 1 spray into both nostrils daily as needed for allergies or rhinitis., Disp: 16 g, Rfl: 11   gabapentin (NEURONTIN) 300 MG capsule, Take 3 capsules (900 mg total) by mouth at bedtime. G25.81, Disp: 270 capsule, Rfl: 0   glucose 4 GM chewable tablet, Chew 1 tablet (4 g total) by mouth as needed for low blood sugar., Disp: 50 tablet, Rfl: 12   insulin glargine (LANTUS) 100 UNIT/ML Solostar Pen, Inject 40-50 Units into the skin daily. Inject 40-50 Units into the skin at bedtime., Disp: 45 mL, Rfl: 2   insulin glargine-yfgn (SEMGLEE) 100 UNIT/ML Pen, SMARTSIG:40-50 Unit(s)  SUB-Q Every Night, Disp: , Rfl:    Insulin Lispro-aabc (LYUMJEV KWIKPEN) 200 UNIT/ML KwikPen, Inject 15-25 Units into the skin 3 (three) times daily before meals., Disp: 12 mL, Rfl: 3   Insulin Pen Needle (BD PEN NEEDLE NANO 2ND GEN) 32G X 4 MM MISC, USE AS DIRECTED, Disp: 100 each, Rfl: 3   LIFESCAN FINEPOINT LANCETS MISC, Use to test for blood sugar three times daily dx: E11.51, Disp: 300 each, Rfl: 1   metoprolol succinate (TOPROL-XL) 25 MG 24 hr tablet, TAKE 1 TABLET BY MOUTH EVERY DAY WITH OR IMMEDIATELY FOLLOWING A MEAL, Disp: 90 tablet, Rfl: 1   nitroGLYCERIN (NITROSTAT) 0.4 MG SL tablet, PLACE 1 TABLET UNDER THE TONGUE EVERY 5 MINUTES AS NEEDED FOR CHEST PAIN., Disp: 50 tablet, Rfl: 2   ONETOUCH ULTRA test strip, USE TO TEST FOR BLOOD SUGAR THREE TIMES DAILY DX: E11.51, Disp: 300 strip, Rfl: 11   pantoprazole (PROTONIX) 40 MG tablet, TAKE ONE TABLET BY MOUTH ONCE DAILY FOR STOMACH, Disp: 90 tablet, Rfl: 3   PRALUENT 150 MG/ML SOAJ, INJECT 1 PEN INTO THE SKIN EVERY 14 (FOURTEEN) DAYS., Disp: 6 mL, Rfl: 1   pramipexole (MIRAPEX) 0.5 MG tablet, Take 0.25-0.5 mg by mouth as needed., Disp: , Rfl:    tiZANidine (ZANAFLEX) 4 MG tablet, Take 1 tablet (4 mg total) by mouth every 8 (eight) hours as needed., Disp: 30 tablet, Rfl: 11   valsartan-hydrochlorothiazide (DIOVAN HCT) 160-25 MG tablet, Take 1 tablet by mouth daily., Disp: 90 tablet, Rfl: 3 No current facility-administered medications for this visit.  Facility-Administered Medications Ordered in Other Visits:    [COMPLETED] hydrALAZINE (APRESOLINE) injection 10 mg, 10 mg, Intravenous, Once, [name], MD, 10 mg at 05/01/21 1054   Meds ordered  this encounter  Medications   dapagliflozin propanediol (FARXIGA) 10 MG TABS tablet    Sig: Take 1 tablet (10 mg total) by mouth daily before breakfast.    Dispense:  30 tablet    Refill:  2     Medications Discontinued During This Encounter  Medication Reason   furosemide (LASIX) 40 MG tablet  Completed Course     ASSESSMENT AND PLAN: .      ICD-10-CM   1. Acute on chronic diastolic heart failure (HCC)  W09.81 dapagliflozin propanediol (FARXIGA) 10 MG TABS tablet    Basic Metabolic Panel (BMET)    Pro b natriuretic peptide    2. Coronary artery disease of native artery of native heart with stable angina pectoris (HCC)  I25.118 Lipid Profile    Basic Metabolic Panel (BMET)    3. Essential hypertension  I10 Basic Metabolic Panel (BMET)    4. Pure hypercholesterolemia  E78.00 Lipid Profile    5. 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 Acute on chronic diastolic heart failure (HCC) Patient is in acute decompensated diastolic heart failure.  Extensive discussion with the patient and his wife at the bedside regarding the etiology for his heart failure which is mostly contributed by both the combination of uncontrolled diabetes mellitus, obesity, diet contributing as well.  Do not suspect progression of coronary artery disease as he has not had any anginal symptoms.  Will continue with Lasix with 40 mg daily, reduced from 80 mg, will add Farxiga 10 mg daily.  Discussed regarding low-salt diet, leg rising, not to consume and drink excess amounts of fluids.  Labs will be ordered today including BMP, BNP and his lipids need to be rechecked.  2. Coronary artery disease of native artery of native heart with stable angina pectoris [facility]) Patient has had no recent angina in spite of acute decompensated heart failure.  Continue present management.  3. Essential hypertension Blood pressure is well-controlled on metoprolol succinate 25 mg daily and valsartan HCT 160/25 mg daily.  Will continue the same.  4. Pure hypercholesterolemia His lipids are not at goal as he had discontinued and forgot to take Praluent but he is back on Praluent.  Will check his lipids today.  Goal LDL <70.  5. Statin myopathy Patient has statin intolerance and statin myopathy and also worsening of restless  leg syndrome on statins.  Unless his symptoms are not responding, continues to have weight gain and dyspnea and worsening leg edema, I will see him back in 6 weeks.  Signed,  [name], MD, [facility] 06/09/2023, 3:26 PM [facility] [address] [place], [place] [address] Phone: [phone]. Fax:  [phone]

## 2023-06-09 NOTE — Patient Instructions (Signed)
 Medication Instructions:  Your physician has recommended you make the following change in your medication: Continue furosemide 40 mg by mouth daily Start Farxiga 10 mg by mouth daily  *If you need a refill on your cardiac medications before your next appointment, please call your pharmacy*  Lab Work: Have lab work done today at [facility] on the first floor--BMP, BNP,Lipids If you have labs (blood work) drawn today and your tests are completely normal, you will receive your results only by: MyChart Message (if you have MyChart) OR A paper copy in the mail If you have any lab test that is abnormal or we need to change your treatment, we will call you to review the results.  Testing/Procedures: none  Follow-Up: At [facility] you and your health needs are our priority.  As part of our continuing mission to provide you with exceptional heart care, our providers are all part of one team.  This team includes your primary Cardiologist (physician) and Advanced Practice Providers or APPs (Physician Assistants and Nurse Practitioners) who all work together to provide you with the care you need, when you need it.  Your next appointment:   6 week(s)  Provider:   [name], MD or APP     We recommend signing up for the patient portal called "MyChart".  Sign up information is provided on this After Visit Summary.  MyChart is used to connect with patients for Virtual Visits (Telemedicine).  Patients are able to view lab/test results, encounter notes, upcoming appointments, etc.  Non-urgent messages can be sent to your provider as well.   To learn more about what you can do with MyChart, go to [email or web address].   Other Instructions       1st Floor: - Lobby - Registration  - Pharmacy  - Lab - Cafe  2nd Floor: - PV Lab - Diagnostic Testing (echo, CT, nuclear med)  3rd Floor: - Vacant  4th Floor: - TCTS (cardiothoracic surgery) - AFib Clinic - Structural Heart Clinic -  Vascular Surgery  - Vascular Ultrasound  5th Floor: - HeartCare Cardiology (general and EP) - Clinical Pharmacy for coumadin, hypertension, lipid, weight-loss medications, and med management appointments    Valet parking services will be available as well.

## 2023-06-10 ENCOUNTER — Encounter: Payer: Self-pay | Admitting: Internal Medicine

## 2023-06-10 ENCOUNTER — Encounter: Payer: Self-pay | Admitting: Cardiology

## 2023-06-10 LAB — PRO B NATRIURETIC PEPTIDE: NT-Pro BNP: 81 pg/mL (ref 0–376)

## 2023-06-10 LAB — LIPID PANEL
Chol/HDL Ratio: 2.4 ratio (ref 0.0–5.0)
Cholesterol, Total: 102 mg/dL (ref 100–199)
HDL: 43 mg/dL (ref >39)
LDL Chol Calc (NIH): 38 mg/dL (ref 0–99)
Triglycerides: 114 mg/dL (ref 0–149)
VLDL Cholesterol Cal: 21 mg/dL (ref 5–40)

## 2023-06-10 LAB — BASIC METABOLIC PANEL WITH GFR
BUN/Creatinine Ratio: 19 (ref 10–24)
BUN: 21 mg/dL (ref 8–27)
CO2: 27 mmol/L (ref 20–29)
Calcium: 9.5 mg/dL (ref 8.6–10.2)
Chloride: 99 mmol/L (ref 96–106)
Creatinine, Ser: 1.13 mg/dL (ref 0.76–1.27)
Glucose: 106 mg/dL — ABNORMAL HIGH (ref 70–99)
Potassium: 4.6 mmol/L (ref 3.5–5.2)
Sodium: 141 mmol/L (ref 134–144)
eGFR: 69 mL/min/{1.73_m2} (ref >59)

## 2023-06-10 NOTE — Progress Notes (Signed)
 Normal labs including cholesterol. Although Pro BNP is normal, clinically he is still in heart failure as previously discussed

## 2023-06-11 ENCOUNTER — Telehealth: Payer: Self-pay

## 2023-06-11 NOTE — Telephone Encounter (Signed)
 Sample  Medication:Mounjaro Dose: 2.5 mg  Quantity:1 box MVH:[id number] D EXP:09/24/24

## 2023-06-13 NOTE — Telephone Encounter (Signed)
 Patient picked up sample

## 2023-06-13 NOTE — Telephone Encounter (Signed)
kn

## 2023-06-25 ENCOUNTER — Other Ambulatory Visit: Payer: Self-pay | Admitting: Internal Medicine

## 2023-06-25 ENCOUNTER — Other Ambulatory Visit: Payer: Self-pay | Admitting: Nurse Practitioner

## 2023-06-25 DIAGNOSIS — G2581 Restless legs syndrome: Secondary | ICD-10-CM

## 2023-06-27 ENCOUNTER — Other Ambulatory Visit: Payer: Self-pay | Admitting: Internal Medicine

## 2023-06-27 MED ORDER — FIASP FLEXTOUCH 100 UNIT/ML ~~LOC~~ SOPN: PEN_INJECTOR | SUBCUTANEOUS | 2 refills | Status: DC

## 2023-06-27 NOTE — Addendum Note (Signed)
 Addended by: Vernon Goodpasture on: 06/27/2023 03:29 PM   Modules accepted: Orders

## 2023-07-04 ENCOUNTER — Ambulatory Visit: Payer: Medicare Other | Admitting: Neurology

## 2023-07-18 ENCOUNTER — Encounter: Attending: Nurse Practitioner | Admitting: Dietician

## 2023-07-18 ENCOUNTER — Encounter: Payer: Self-pay | Admitting: Dietician

## 2023-07-18 VITALS — Ht 68.0 in | Wt 208.0 lb

## 2023-07-18 DIAGNOSIS — E119 Type 2 diabetes mellitus without complications: Secondary | ICD-10-CM | POA: Diagnosis not present

## 2023-07-18 DIAGNOSIS — Z794 Long term (current) use of insulin: Secondary | ICD-10-CM | POA: Insufficient documentation

## 2023-07-18 NOTE — Patient Instructions (Addendum)
 Bolus about 15 minutes before you eat. (Consider a meal dose and a correction dose) Low fat/low saturated fat Be mindful of carbohydrate portions/grams you are eating.  Aim to be consistent. Snack only if hungry

## 2023-07-18 NOTE — Progress Notes (Signed)
 Medical Nutrition Therapy  Appointment Start time:  [phone]  Appointment End time:  1600 Patient is here today with his wife Dina Francisco.  They were last seen by a RD in this office in 2019. Occasional low blood glucose likely from over bolusing. He just had back surgery and is waiting for a couple more weeks to return to work.  He states that he has much less back pain. He states that his son is a Company secretary and they need to start to eat more like he does (less processed foods, healthier eating.  Primary concerns today: They would like to know how to meal plan and carb count  Referral diagnosis: Type 2 Diabetes Preferred learning style: no preference indicated Learning readiness: ready, change in progress   NUTRITION ASSESSMENT  68" 208 lbs 07/18/2023 178 lbs 02/2024 and gained due to his back and decreased activity 162 lbs lowest adult weight.  Clinical Medical Hx: Type 2 diabetes, HLD, HTN, GERD, stroke, CAD Medications: Farxiga , 50 units Lantus  q HS, Novolog  - 15-20 units before each meal and a correction dose of 5-7 units between meals if needed, Praluent , valsartan -hydrochlorothiazide , metoprolol , pramipexole  for restless legs Labs: A1C 7.5% 3/182025 decreased from 9.5% 04/03/2023 Notable Signs/Symptoms: none CGM:  Libre 3  CGM Results from download: 05/18/2023  % Time CGM active:   96 %   (Goal >70%)  Average glucose:  146 mg/dL for 14 days  Glucose management indicator:   6.8 %  Time in range (70-180 mg/dL):   81 %   (Goal >95%)  Time High (181-250 mg/dL):   17 %   (Goal < 28%)  Time Very High (>250 mg/dL):    1 %   (Goal < 5%)  Time Low (54-69 mg/dL):   1 %   (Goal <4%)  Time Very Low (<54 mg/dL):   0 %   (Goal <1%)  %CV (glucose variability)     %  (Goal <36%)   Lifestyle & Dietary Hx 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 Hey are eating more meals at home since his wife has  retired 11/2022.  Estimated daily fluid intake: 40-60 oz Supplements: none Sleep: poor due to restless legs Stress / self-care: normal Current average weekly physical activity: hasn't been able to for the past 8 months due to his back.  Played golf and walked prior to this.  24-Hr Dietary Recall First Meal: 1/2 tenderloin on 1/2 hamburger bun Snack: orange Second Meal: 1/2 tenderloin on 1/2 hamburger bun Snack: 2 naval oranges Third Meal: 5 fried chicken wings with hot sauce Snack: 1 cup ice cream with milk Beverages: water, diet coke, black coffee    NUTRITION DIAGNOSIS  NB-1.1 Food and nutrition-related knowledge deficit As related to balance of carbohydrates, protein, and fat.  As evidenced by diet hx and patient report.   NUTRITION INTERVENTION  Nutrition education (E-1) on the following topics:  Discussed timing of meal bolus and to add the correction dose and meal dose together. Discussed counting carbohydrates by portion size and label reading Discussed his insulin  to carb ratio (1:2-1:4) based on current insulin  use and calculations  and an insulin  sensitivity factor of 10-15 based on calculations and current scale.  Patient and wife are familiar with how to use these and dose insulin  accordingly. Discussed insulin  stacking and importance of bolusing at the correct time to avoid low blood glucose. Discussed mindfulness with  eating.  Carbs with meals in consistent amounts.  Snack only if hungry.  Consider changing meal times if needed to avoid snacking or becoming over hungry. Discussed saturated fat content of food and to choose lean choices Discussed a balanced plate way of meal planning.  Looking at proportions of foods on the plate.  Handouts Provided Include  Meal plan card My plate Diabetes Resources Label reading Snack list  Learning Style & Readiness for Change Teaching method utilized: Visual & Auditory  Demonstrated degree of understanding via: Teach Back   Barriers to learning/adherence to lifestyle change: none  Goals Established by Pt Bolus about 15 minutes before you eat. (Consider a meal dose and a correction dose) Low fat/low saturated fat Be mindful of carbohydrate portions/grams you are eating.  Aim to be consistent. Snack only if hungry  MONITORING & EVALUATION Dietary intake, weekly physical activity, and label reading in 6 months.  Next Steps  Patient is to call for questions.

## 2023-07-24 ENCOUNTER — Ambulatory Visit: Admitting: Cardiology

## 2023-07-25 NOTE — Progress Notes (Unsigned)
 NEUROLOGY FOLLOW UP OFFICE NOTE  Kenneth Poteete Sr. [id number]  Subjective:  Kenneth Keady Sr. is a 73 y.o. year old male with a history of arthritis, GERD, COPD, CAD, HTN, HLD, DM2, iron  deficiency anemia, TIA (01/14/21 - left face droop and blurry vision) who we last saw on 01/29/23 for restless leg syndrome, functional movement disorder, and B12 deficiency.  To briefly review: Patient started having restless legs syndrome. He describes needing to move his legs, then would improve after moving. It would happen worse at night and while in bed. He went untreated for many years. After his first stent in 2014, patient was put on Lipitor. He had such severe shaking, he required hospitalization and narcotics to stop his restless legs. He mentions having problems with his iron . He has taken iron  supplementation and iron  shots. He was not currently on any iron  supplementation at initial visit on 10/22/21.   He has taken many different medications in the past, but is currently on gabapentin  200 mg at bedtime and pramipexole  1mg  at 5 pm, 1mg  at 7 pm, and 2mg  at bedtime (9-10 pm). Previously he did not take pramipexole  later, but now requires it earlier and requires more medication. Symptoms continue to worsen though. He cannot sit down long or symptoms will get severe. He feels like he has to move to keep the symptoms at bay. His symptoms have spread throughout the body now.   Patient was previously seen for restless leg syndrome (in 2015) by Dr. Ty Gales. To briefly review, per history from clinic note on 09/29/13: "was admitted at [facility] last 09/18/2013 for leg pain due to severe RLS, found to have rhabdomyolysis with CK of 4907.  He was admitted for hydration and pain management.  He has had RLS for at least 20 years, initially affecting his legs, however since 2004 his arms have been involved as well.  He has been taking Mirapex  for at least 10 years, and had been takin 0.75mg  dose 1-2 tablets at night.  This dose  was causing him drowsiness.  He saw his PCP on 07/14 due to worsening RLS symptoms. He was tried on Zanaflex  and Mirapex  was discontinued, which did not help.  He restarted Mirapex  and had to take additional doses due to constant limb movements and pain, prompting him to go to the ER.  He was started on Gabapentin  and clonazepam  which did not help much, he was also given Dilaudid  which quieted him down.  He was discharged home then returned to the ER due to persistently restless and painful legs bilaterally. He was described to be literally thrashing about the bed, able to answer questions but in significant distress. He tells me the symptoms would start in his right leg, leg would flex at the hip, then he stretches and leans back because the leg is jerking so violently for up to 1-1/2 hours. This eases off then starts on his left leg.  Walking and moving around helps.  Symptoms improved with pain medication. He saw his PCP a week ago and reported leg swelling, ?due to gabapentin . He was started on Sinemet  10/100mg  TID, which her reports has helped with the RLS symptoms, however wears off in the middle of the night. He would take 1/2 tab Mirapex  when Sinemet  wears off.  Since starting Sinemet , he reports weird dreams but no other side effects.  He had discontinued gabapentin , clonazepam , and Zanaflex .     He has infrequent headaches, dyspnea on exertion (climbing steps), occasional choking. Otherwise  he denies any diplopia, dysarthria, focal numbness/tingling, bowel/bladder dysfunction.  He has a history of C1 fracture at age 30 or 73. No known family history of similar RLS because he was adopted."   Patient has previously tried Mirapex  and Sinemet  as well as gabapentin , clonazepam , zanaflex , and Tramadol .   He endorses occasional discomfort in his legs and swelling of his legs.   He denies fevers, chills, or unexplained weight lost.   EtOH: none  Dietary restrictions: none Family hx of neurologic  disorders: patient is adopted and not sure   At 01/02/22 visit, patient had dyskinesias thought to be due to excess of pramipexole  as he continued to take 3 mg nightly. He was instructed to decrease pramipexole . He described increasing movements continuing on 01/29/22 at which time patient was still taking up to 2 mg of pramipexole  nightly. We again agreed to cut down on the pramipexole  and take gabapentin  instead of RLS symptoms.   He went to [facility] ED on 03/02/22, [facility] ED on 03/04/22, and [facility] again on 03/05/22 for the movements.   [facility] ED assessment/plan: [name] endorses a chronic longstanding history of restless leg syndrome which per history appears consistent with, also presents with years of abnormal uncontrollable movements that are worse at rest and specifically at nighttime. On history it appears the symptoms started sometime after being treated for restless leg syndrome with pramipexole . On exam there is no significant bradykinesia rigidity or postural action tremor. Abnormal movements involving his trunk and lower extremities are observed consistent and concerning for dyskinesia. Overall believe his presentation is consistent with likely a chronic history of restless leg syndrome and a secondary process of dyskinesia in the setting of chronic pramipexole  use. Further workup and recommendations as below. - Stop taking pramipexole ! - Continue home iron  supplementations as tolerated - Continue Gabapentin , take 300 mg in AM, 300 mg at 12:00 PM (noon), 900 mg at night - They recommend follow up with [facility]. A referral has been placed, and you should be hearing from them.  - They recommend improving sleep hygiene with: Setting regular sleep hours, avoiding electronics in bed, practice visualization exercises if he cannot fall asleep, limit liquids prior to sleep, and eliminate all naps.    Patient mentioned that he can set his watch to his symptoms. He knows when he sits down to eat  in the evening, he is going to be shaking. He will take medication then. After his meal, his symptoms will get worse. He will take more medication. He will get worse throughout the night. He will have to keep moving, either on a treadmill or in his shop. This is help with symptoms. In the past, he would get drowsy then finally go to sleep, but not sleep all night. Wife mentions that these jerking movements have been present for years, but maybe worse now (since his 22s per wife). It has gotten worse over the last few months since our treatment plan began.    Wife provided a video from ED on 03/05/22 in which patient had intermittent quick jerking of all extremities, including in the head and neck. It was not rhythmic and without clear pattern. After getting permission from patient in writing (will be scanned into chart), I recorded the recording and patient's jerks today with office iPad.   04/10/22: Patient was having increasing RLS symptoms. He mentioned he was taking 1/2 tablet of pramipexole  on 03/20/22, but not every night, and this was helping. I recommended he take  1/2 tablet of pramipexole  nightly on 03/21/22 as movements I saw were no longer felt to be dyskinesias.    Overall, his symptoms are about the same. He has good days and bad days. He will go a couple of nights without sleeping well, and then will sleep well for a couple of days. Last night he walked from 7-9 pm. He slept at 9pm to 3am (waking to go to bathroom). He then slept until 5 am. That was a good night.   He is still attempting to get in touch with Alysia Bachelor to set up an appointment for FND, but has not yet been able to reach her.   He continues to take gabapentin  300 mg qhs and pramipexole  1/2 tablet (0.50 mg).    Patient went to sleep medicine who recommended a sleep study. He was told he had sleep apnea. He is unsure of follow up plans of now.   07/24/22: Patient was doing well for the last couple of months. He has not been  able to sleep over the last 4-5 nights due to shaking though.   He is taking pramipexole  0.25 mg and gabapentin  200 mg around 6 pm. That will help until 8:30pm (bedtime) when he will take pramipexole  0.25 mg and gabapentin  200 mg and a tylenol  PM. This was working until the last few nights.   Patient had a sleep study that showed severe sleep apnea. Patient mentions that he thought he was supposed to hear back about CPAP, but has not.   Patient had mentioned a message therapist, but has not been. Patient is also still on the wait list for the [facility] therapist in [facility]).  01/29/23: Patient is having right knee pain. He is seeing sports medicine and having an MRI soon. He has had a couple of falls due to the knee pain. He mentions that he has a couple of bulging disks in his low back that is also being followed by them.   Regarding his RLS, it fluctuates. He continues to have severe symptoms at time. Symptoms continue to be worse at night. He will take 300 mg of gabapentin  and 0.5 mg of pramipexole  around 5 pm. Around 8-8:30 pm, he will take another 300 mg of gabapentin  and 0.5 mg to 1 mg of pramipexole . He will also take a tylenol  PM to help him sleep. He takes melatonin and magnesium  as well. He ran out of iron  supplement, so he has not been taking this for about 3 months. He is also no longer taking B12.   Patient has had the sleep study, but has not seen sleep medicine. He continues to stop breathing while sleeping per wife.   Alysia Bachelor has been in contact with patient, but she currently has no openings to see patient.   Most recent Assessment and Plan (01/29/23): This is Kenneth Barters Sr., a 73 y.o. male with: Restless leg syndrome - complicated by iron  deficiency, normalized with iron  supplementation on most recent testing. No evidence of parkinsonism on exam. Functional movement disorder - shaking of arms and head, not consistent with restless legs, but previous video of  patient in ED more consistent with functional etiology. B12 deficiency - not currently on supplementation OSA - severe by sleep study earlier this year. Has not followed up with sleep medicine. This is likely contributing to RLS and overall quality of life   Plan: -Recheck iron , B12 -Continue pramipexole  0.5 mg qhs -Increase: Gabapentin  900 mg qhs -Not on iron , but  pending ferritin results, may restart ferrous sulfate  325 mg daily -Therapy for FND with Alysia Bachelor - referral placed - patient is currently on the wait list to be seen - will try to reach out and reconnect as it has been ~1 year since he spoke with her -Referral to sleep medicine for severe OSA  Since their last visit: Repeat ferritin was 235 after last clinic visit. B12 was also normal (384).  Sleep medicine?***  MEDICATIONS:  Outpatient Encounter Medications as of 08/01/2023  Medication Sig   acetaminophen  (TYLENOL ) 500 MG tablet Take 500 mg by mouth every 6 (six) hours as needed for mild pain, moderate pain, headache or fever.   albuterol  (VENTOLIN  HFA) 108 (90 Base) MCG/ACT inhaler TAKE 2 PUFFS BY MOUTH EVERY 6 HOURS AS NEEDED FOR WHEEZE OR SHORTNESS OF BREATH   aspirin  EC 81 MG tablet Take 1 tablet (81 mg total) by mouth daily. Swallow whole.   Budeson-Glycopyrrol-Formoterol (BREZTRI  AEROSPHERE) 160-9-4.8 MCG/ACT AERO Inhale 2 puffs into the lungs in the morning and at bedtime.   Continuous Blood Gluc Sensor (FREESTYLE LIBRE 14 DAY SENSOR) MISC USE TO TEST BLOOD SUGAR THREE TIMES DAILY. E11.51. E11.59   dapagliflozin  propanediol (FARXIGA ) 10 MG TABS tablet Take 1 tablet (10 mg total) by mouth daily before breakfast.   dapagliflozin  propanediol (FARXIGA ) 10 MG TABS tablet Take 1 tablet (10 mg total) by mouth daily before breakfast.   diphenhydramine -acetaminophen  (TYLENOL  PM) 25-500 MG TABS tablet Take 1 tablet by mouth at bedtime as needed.   fluticasone  (FLONASE ) 50 MCG/ACT nasal spray Place 1 spray into both  nostrils daily as needed for allergies or rhinitis.   furosemide  (LASIX ) 40 MG tablet Take 1 tablet (40 mg total) by mouth daily for 3 days. (Patient not taking: Reported on 07/18/2023)   gabapentin  (NEURONTIN ) 300 MG capsule TAKE 3 CAPSULES (900 MG TOTAL) BY MOUTH AT BEDTIME. G25.81   glucose 4 GM chewable tablet Chew 1 tablet (4 g total) by mouth as needed for low blood sugar.   insulin  aspart (NOVOLOG  FLEXPEN) 100 UNIT/ML FlexPen Inject 15-25 Units into the skin 3 (three) times daily with meals.   insulin  glargine (LANTUS ) 100 UNIT/ML Solostar Pen Inject 40-50 Units into the skin daily. Inject 40-50 Units into the skin at bedtime.   insulin  glargine-yfgn (SEMGLEE ) 100 UNIT/ML Pen SMARTSIG:40-50 Unit(s) SUB-Q Every Night   Insulin  Pen Needle (BD PEN NEEDLE NANO 2ND GEN) 32G X 4 MM MISC USE AS DIRECTED   LIFESCAN FINEPOINT LANCETS MISC Use to test for blood sugar three times daily dx: E11.51   metoprolol  succinate (TOPROL -XL) 25 MG 24 hr tablet TAKE 1 TABLET BY MOUTH EVERY DAY WITH OR IMMEDIATELY FOLLOWING A MEAL   nitroGLYCERIN  (NITROSTAT ) 0.4 MG SL tablet PLACE 1 TABLET UNDER THE TONGUE EVERY 5 MINUTES AS NEEDED FOR CHEST PAIN.   ONETOUCH ULTRA test strip USE TO TEST FOR BLOOD SUGAR THREE TIMES DAILY DX: E11.51   pantoprazole  (PROTONIX ) 40 MG tablet TAKE ONE TABLET BY MOUTH ONCE DAILY FOR STOMACH   PRALUENT  150 MG/ML SOAJ INJECT 1 PEN INTO THE SKIN EVERY 14 (FOURTEEN) DAYS.   pramipexole  (MIRAPEX ) 0.5 MG tablet Take 0.25-0.5 mg by mouth as needed.   tirzepatide (MOUNJARO) 2.5 MG/0.5ML Pen Inject 2.5 mg into the skin once a week.   tiZANidine  (ZANAFLEX ) 4 MG tablet Take 1 tablet (4 mg total) by mouth every 8 (eight) hours as needed. (Patient not taking: Reported on 07/18/2023)   valsartan -hydrochlorothiazide  (DIOVAN  HCT) 160-25 MG tablet Take 1  tablet by mouth daily.   Facility-Administered Encounter Medications as of 08/01/2023  Medication   [COMPLETED] hydrALAZINE  (APRESOLINE ) injection 10 mg     PAST MEDICAL HISTORY: Past Medical History:  Diagnosis Date   Arthritis    "left thumb" (05/23/2017) right shoulder   Barrett's esophagus    "we've been told that it's all gone; still take RX for GERD" (05/23/2017)   Bilateral swelling of feet and ankles x 3 weeks as of 12-01-2019   CAD (coronary artery disease), native coronary artery    Coronary angiogram 05/03/2014:  Proximal LAD 3.0x12 mm Promus premier stent.  04/08/2014: Mid Cx 3.5 x 16 mm Promus DES.  03/29/2013: Mid LAD 2.75 x 38 mm Promus Premier drug-eluting stent, balloon angioplasty of D1 and distal LAD and stenting of distal RCA with 2.75 x 24 mm promos Premier drug-eluting stent 12/15/2012 widely patent.   Cervicalgia    Chronic bronchitis (HCC)    "q yr" (05/23/2017)   Complication of anesthesia    " I do not wake up very well "  slow to wake up   COVID-19 05/2019   all covid symptoms in hospital x 5 days, all symptoms resolved took monoclonal antibody tx    Dyspnea    with exertion   Family history of adverse reaction to anesthesia    "son w/PONV"   GERD (gastroesophageal reflux disease)    Heart murmur    "grew out of it"    Hyperlipidemia    Hypertension    Hypoglycemia, unspecified    IDDM (insulin  dependent diabetes mellitus)    type 2   Impotence of organic origin    Iron  deficiency anemia    Memory loss    mild   Other malaise and fatigue    Pneumonia 05/2019   PONV (postoperative nausea and vomiting)    after wisdom teeth pulled, no problems with other surgeries   Restless leg    Rotator cuff arthropathy    Stroke (HCC)    Tension headache    "sometimes" (05/23/2017)   Unspecified gastritis and gastroduodenitis with hemorrhage    Unstable angina pectoris (HCC) 04/08/2014   Coronary angiogram 05/03/2014:  Proximal LAD 3.0x12 mm Promus premier stent.  04/08/2014: Mid Cx 3.5 x 16 mm Promus DES.  03/29/2013: Mid LAD 2.75 x 38 mm Promus Premier drug-eluting stent, balloon angioplasty of D1 and distal LAD and  stenting of distal RCA with 2.75 x 24 mm promos Premier drug-eluting stent 12/15/2012 widely patent.    PAST SURGICAL HISTORY: Past Surgical History:  Procedure Laterality Date   BICEPT TENODESIS Right 12/07/2019   Procedure: RIGHT BICEPS TENODESIS;  Surgeon: Saundra Curl, MD;  Location: [facility] [place];  Service: Orthopedics;  Laterality: Right;   CARDIAC CATHETERIZATION N/A 09/25/2015   Procedure: Left Heart Cath and Coronary Angiography;  Surgeon: Knox Perl, MD;  Location: [facility] INVASIVE CV LAB;  Service: Cardiovascular;  Laterality: N/A;   CARDIAC CATHETERIZATION N/A 09/25/2015   Procedure: Intravascular Pressure Wire/FFR Study;  Surgeon: Knox Perl, MD;  Location: [facility] INVASIVE CV LAB;  Service: Cardiovascular;  Laterality: N/A;   CORONARY ANGIOPLASTY WITH STENT PLACEMENT  12/15/2012; 04/28/2014; 05/03/2014   "2; 1; 1" , 4 stents 2 balloons   CORONARY STENT INTERVENTION N/A 05/01/2021   Procedure: CORONARY STENT INTERVENTION;  Surgeon: Knox Perl, MD;  Location: [facility] INVASIVE CV LAB;  Service: Cardiovascular;  Laterality: N/A;   FRACTIONAL FLOW RESERVE WIRE Right 03/26/2013   Procedure: FRACTIONAL FLOW RESERVE WIRE;  Surgeon: Devon Fogo  Cassie Click, MD;  Location: [facility] CATH LAB;  Service: Cardiovascular;  Laterality: Right;   FRACTIONAL FLOW RESERVE WIRE N/A 05/03/2014   Procedure: FRACTIONAL FLOW RESERVE WIRE;  Surgeon: Jessica Morn, MD;  Location: [facility] CATH LAB;  Service: Cardiovascular;  Laterality: N/A;   HERNIA REPAIR  2010   "umbilical"    INCISION AND DRAINAGE ABSCESS Left 05/2007   "groin"    KNEE SURGERY Right 1992;  2000   "calcium  deposits removed" (12/15/2012)   LEFT HEART CATH AND CORONARY ANGIOGRAPHY N/A 05/23/2017   Procedure: LEFT HEART CATH AND CORONARY ANGIOGRAPHY;  Surgeon: Knox Perl, MD;  Location: [facility] INVASIVE CV LAB;  Service: Cardiovascular;  Laterality: N/A;   LEFT HEART CATH AND CORONARY ANGIOGRAPHY N/A 05/01/2021   Procedure: LEFT HEART CATH AND CORONARY  ANGIOGRAPHY;  Surgeon: Knox Perl, MD;  Location: [facility] INVASIVE CV LAB;  Service: Cardiovascular;  Laterality: N/A;   LEFT HEART CATHETERIZATION WITH CORONARY ANGIOGRAM N/A 12/15/2012   Procedure: LEFT HEART CATHETERIZATION WITH CORONARY ANGIOGRAM;  Surgeon: Jessica Morn, MD;  Location: [facility] CATH LAB;  Service: Cardiovascular;  Laterality: N/A;   LEFT HEART CATHETERIZATION WITH CORONARY ANGIOGRAM N/A 03/26/2013   Procedure: LEFT HEART CATHETERIZATION WITH CORONARY ANGIOGRAM;  Surgeon: Jessica Morn, MD;  Location: [facility] CATH LAB;  Service: Cardiovascular;  Laterality: N/A;   LEFT HEART CATHETERIZATION WITH CORONARY ANGIOGRAM N/A 04/08/2014   Procedure: LEFT HEART CATHETERIZATION WITH CORONARY ANGIOGRAM;  Surgeon: Arlander Bellman, MD;  Location: [facility] CATH LAB;  Service: Cardiovascular;  Laterality: N/A;   MOUTH SURGERY Right    PERCUTANEOUS CORONARY STENT INTERVENTION (PCI-S) N/A 05/03/2014   Procedure: PERCUTANEOUS CORONARY STENT INTERVENTION (PCI-S);  Surgeon: Jessica Morn, MD;  Location: [facility] CATH LAB;  Service: Cardiovascular;  Laterality: N/A;   RADIOLOGY WITH ANESTHESIA N/A 02/08/2021   Procedure: MRI WITH ANESTHESIA OF ABDOMEN WITH AND WITHOUT CONSTRAST;  Surgeon: Radiologist, Medication, MD;  Location: [facility] OR;  Service: Radiology;  Laterality: N/A;   ROTATOR CUFF REPAIR  2022   UMBILICAL HERNIA REPAIR  yrs ago    ALLERGIES: Allergies  Allergen Reactions   Amlodipine  Other (See Comments)    Worsening restless legs, dyspnea and leg swelling   Lyrica  [Pregabalin ] Other (See Comments)    Made patient very lethargic the next morning, very hard to patient to function, move, etc.    Other Other (See Comments)    Convulsions, Causes Restless Legs, rhabdomyolysis   Statins Other (See Comments)    Causes Restless Legs, rhabdomyolysis   Ativan  [Lorazepam ] Other (See Comments)    Markedly increased restless legs   Trulicity  Frieda.Fuss ] Other (See Comments)    GI side effects      FAMILY HISTORY: Family History  Adopted: Yes  Problem Relation Age of Onset   Emphysema Mother     SOCIAL HISTORY: Social History   Tobacco Use   Smoking status: Never   Smokeless tobacco: Never  Vaping Use   Vaping status: Never Used  Substance Use Topics   Alcohol use: No   Drug use: No   Social History   Social History Narrative   Right handed    Caffeine 2 cups daily   Winter time 4 cups daily   Lives family one level home    Semi Retired      Objective:  Vital Signs:  There were no vitals taken for this visit.  ***  Labs and Imaging review: New results: 01/29/23: B12: 384 Iron  studies significant for ferritin 235, sat %  42  HbA1c (04/03/23): 9.5 HbA1c (05/20/23): 7.5  Previously reviewed results: CBC (12/24/22): significant for chronic thrombocytopenia (platelets 124) BMP (12/23/22): significant for glucose of 139, Cr 1.62, BUN 39 HbA1c (12/06/22): 8.4   07/22/22: Iron  studies: normal (ferritin 173) HbA1c: 9.1   04/10/22:  Iron  studies: normal (ferritin 119)   HbA1c (02/28/22): 7.8   10/22/21: Ferritin 21 B12: 248 Normal or unremarkable: Vit D CMP significant for elevated glucose (193) and BUN (28) CBC significant for platelets of 110 (chronic)   EMG (03/23/14): Normal study of RUE and RLE (no myopathy, radiculopathy, or peripheral neuropathy).   MRI brain, MRA head and neck (01/14/21): IMPRESSION: MRI HEAD:   Normal brain MRI for age. No acute intracranial infarct or other abnormality identified.   MRA HEAD:   Normal intracranial MRA. No large vessel occlusion or hemodynamically significant stenosis. No aneurysm.   MRA NECK:   1. 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 The distal right V4 segment is patent on corresponding MRA head portion of this exam with perfusion of the right PICA. 2. Widely patent dominant left vertebral artery. 3. Wide patency of both carotid  artery systems within the neck.   Sleep study (04/08/22): IMPRESSIONS - Severe Obstructive Sleep apnea(OSA) Sub-Optimal pressure attained. - Electrocardiographic data showed presence of PVCs. - No Significant Central Sleep Apnea (CSA) - Moderate Oxygen  Desaturation - The patient snored with moderate snoring volume. - EEG did not show alpha intrusion. - No significant periodic leg movements(PLMs) during sleep. However, no significant associated arousals. - Reduced sleep efficiency, normal primary sleep latency, long REM sleep latency and no slow wave latency. - Sleep efficiency was significantly reduced by multiple episodes of hypoglycemia and intervention, leg cramping, pain and discomfort,  Assessment/Plan:  This is Kenneth Barters Sr., a 73 y.o. male with: ***   Plan: ***  Return to clinic in ***  Total time spent reviewing records, interview, history/exam, documentation, and coordination of care on day of encounter:  *** min  Rommie Coats, MD

## 2023-08-01 ENCOUNTER — Encounter: Payer: Self-pay | Admitting: Neurology

## 2023-08-01 ENCOUNTER — Ambulatory Visit: Admitting: Cardiology

## 2023-08-01 ENCOUNTER — Telehealth: Payer: Self-pay

## 2023-08-01 ENCOUNTER — Ambulatory Visit (INDEPENDENT_AMBULATORY_CARE_PROVIDER_SITE_OTHER): Admitting: Neurology

## 2023-08-01 VITALS — BP 126/75 | HR 68 | Ht 68.0 in | Wt 207.0 lb

## 2023-08-01 DIAGNOSIS — G259 Extrapyramidal and movement disorder, unspecified: Secondary | ICD-10-CM | POA: Diagnosis not present

## 2023-08-01 DIAGNOSIS — G2581 Restless legs syndrome: Secondary | ICD-10-CM

## 2023-08-01 DIAGNOSIS — G4733 Obstructive sleep apnea (adult) (pediatric): Secondary | ICD-10-CM

## 2023-08-01 MED ORDER — MOUNJARO 2.5 MG/0.5ML ~~LOC~~ SOAJ: 2.5000 mg | SUBCUTANEOUS | Status: DC

## 2023-08-01 NOTE — Telephone Encounter (Signed)
 Medication Samples have been provided to the patient.  Drug name: Mounjaro        Strength: 2.5        Qty: 1  LOT: [id number]  Exp.Date: 02/11/2025  Dosing instructions: Inject 1x Weekly   The patient has been instructed regarding the correct time, dose, and frequency of taking this medication, including desired effects and most common side effects.   [name] 10:25 AM 08/01/2023    Requested Prescriptions   Signed Prescriptions Disp Refills   tirzepatide  (MOUNJARO ) 2.5 MG/0.5ML Pen      Sig: Inject 2.5 mg into the skin once a week.    Authorizing Provider: Emilie Harden    Ordering User: [name]

## 2023-08-01 NOTE — Patient Instructions (Addendum)
 Continue gabapentin  900 mg at bedtime.  Reduce pramipexole  to 0.50 mg daily for 1 month, then reduce to 0.25 mg daily for 1 month, then stop. This medication may help restless legs, but will make it worse in the long run.  I am referring you to sleep medicine for severe sleep apnea. This can make restless legs worse and is a risk factor for heart attack and stroke.  Please let me know if you do not hear from someone in 1-2 weeks to schedule the sleep medicine appointment.  Please let me know if you have any questions or concerns in the meantime.  The physicians and staff at [facility] are committed to providing excellent care. You may receive a survey requesting feedback about your experience at our office. We strive to receive "very good" responses to the survey questions. If you feel that your experience would prevent you from giving the office a "very good " response, please contact our office to try to remedy the situation. We may be reached at [phone]. Thank you for taking the time out of your busy day to complete the survey.  Rommie Coats, MD [facility]

## 2023-08-01 NOTE — Addendum Note (Signed)
 Addended by: Arturo Bing on: 08/01/2023 01:08 PM   Modules accepted: Orders

## 2023-08-05 ENCOUNTER — Other Ambulatory Visit: Payer: Self-pay | Admitting: Cardiology

## 2023-08-05 DIAGNOSIS — I5033 Acute on chronic diastolic (congestive) heart failure: Secondary | ICD-10-CM

## 2023-08-11 ENCOUNTER — Telehealth: Payer: Self-pay | Admitting: Neurology

## 2023-08-11 NOTE — Telephone Encounter (Signed)
 Patient was to be referred to have a sleep  study  and has not heard anything  please check on the status of the referral and let patient know

## 2023-08-11 NOTE — Telephone Encounter (Signed)
 Referral sent to  [facility] pulmonology [id number]  [phone]  Pt called an given this information

## 2023-08-26 ENCOUNTER — Ambulatory Visit: Admitting: Internal Medicine

## 2023-09-02 ENCOUNTER — Encounter: Payer: Self-pay | Admitting: Internal Medicine

## 2023-09-02 ENCOUNTER — Ambulatory Visit (INDEPENDENT_AMBULATORY_CARE_PROVIDER_SITE_OTHER): Admitting: Internal Medicine

## 2023-09-02 VITALS — BP 120/68 | HR 71 | Ht 68.0 in | Wt 212.0 lb

## 2023-09-02 DIAGNOSIS — Z7984 Long term (current) use of oral hypoglycemic drugs: Secondary | ICD-10-CM

## 2023-09-02 DIAGNOSIS — I5033 Acute on chronic diastolic (congestive) heart failure: Secondary | ICD-10-CM

## 2023-09-02 DIAGNOSIS — E785 Hyperlipidemia, unspecified: Secondary | ICD-10-CM | POA: Diagnosis not present

## 2023-09-02 DIAGNOSIS — E1159 Type 2 diabetes mellitus with other circulatory complications: Secondary | ICD-10-CM | POA: Diagnosis not present

## 2023-09-02 DIAGNOSIS — E1165 Type 2 diabetes mellitus with hyperglycemia: Secondary | ICD-10-CM

## 2023-09-02 DIAGNOSIS — Z794 Long term (current) use of insulin: Secondary | ICD-10-CM | POA: Diagnosis not present

## 2023-09-02 LAB — POCT GLYCOSYLATED HEMOGLOBIN (HGB A1C): Hemoglobin A1C: 7.5 % — AB (ref 4.0–5.6)

## 2023-09-02 MED ORDER — DAPAGLIFLOZIN PROPANEDIOL 10 MG PO TABS: 10.0000 mg | ORAL_TABLET | Freq: Every day | ORAL | 3 refills | Status: AC

## 2023-09-02 NOTE — Progress Notes (Signed)
 Patient ID: Kenneth Hasz Sr., male   DOB: 11-17-1950, 73 y.o.   MRN: [id number]  HPI: Kenneth Gorka Sr. is a 73 y.o.-year-old male, returning for follow-up for DM2, dx in 2016, insulin -dependent  since 2020, uncontrolled, with complications (CAD, history of CVA, CKD stage III). Pt. previously saw Dr. Kassie, but last visit with me 3.5 months ago.   He is here with his wife who offers part of the history especially regarding his activity and past medical history.  Interim history: No increased urination, nausea, chest pain but has SOB.  He has leg cramps. At last visit, he had significant back, hip, and knee pain for which he had steroid injections in the past.  He was preparing for back surgery at last visit.  I cleared him for this.  He had the surgery since last visit and he is feeling much better. He has leg weakness >> will start PT in 2 weeks. Also has pain in the R leg. He was able to start Mounjaro  since last visit.  But he could not tolerate it due to nausea and food aversion.  Reviewed HbA1c: Lab Results  Component Value Date   HGBA1C 7.5 (A) 05/20/2023   HGBA1C 9.5 (A) 04/03/2023   HGBA1C 8.4 (A) 12/06/2022   HGBA1C 9.1 (H) 07/22/2022   HGBA1C 7.8 (A) 02/28/2022   HGBA1C 8.1 (H) 10/05/2021   HGBA1C 8.3 (A) 05/07/2021   HGBA1C 7.9 (H) 01/15/2021   HGBA1C 8.1 (A) 12/25/2020   HGBA1C 8.6 (A) 09/14/2020   Previously on: - Basaglar  35 >> 30 units at bedtime - Novolog : mostly after the meal) 10-20 units before B 10-15 units before L 15-20 unit before D He stopped Ozempic  0.5 mg weekly >> nausea, could not function. He had nausea with metformin . He also had nausea with Trulicity .  Currently on: - Basaglar  36 >> 40 >> 30 units daily - Fiasp  >> Novolog  10-20 >> 10-30 units before meals (ave 20 units) - Fiasp  >> NovoLog  sliding scale:  150-175: +4 176-200: +6 201-250: +10 251-300: +14 >300: +18 - Farxiga  10 mg before b'fast - started by Dr. Ladona 06/2023 - 300$ for 30 days We  tried Mounjaro  2.5 mg weekly >> nausea, food aversion.  Pt checks his sugars >4 a day with his CGM:   Previously:  Previously:  Lowest sugar was 46 >> ... 50s >> 64 >> 55; he has hypoglycemia awareness at 63.  Highest sugar was 400 >> HI >> 300s  Glucometer: One Touch ultra  - + CKD, last BUN/creatinine:  Lab Results  Component Value Date   BUN 21 06/09/2023   BUN 26 (H) 03/20/2023   CREATININE 1.13 06/09/2023   CREATININE 1.03 03/20/2023   Lab Results  Component Value Date   MICRALBCREAT 9 05/20/2023   MICRALBCREAT 12 07/22/2022   MICRALBCREAT 20 03/06/2020   MICRALBCREAT SEE NOTE 08/27/2016   MICRALBCREAT <10.2 08/26/2013  On Diovan  80 mg daily.  -+HL; last set of lipids: Lab Results  Component Value Date   CHOL 102 06/09/2023   HDL 43 06/09/2023   LDLCALC 38 06/09/2023   LDLDIRECT 110 (H) 06/17/2019   TRIG 114 06/09/2023   CHOLHDL 2.4 06/09/2023  He is on Praluent .  He had side effects from statins in the past.  - last eye exam was 11/13/2022. No DR reportedly.   - +  restless leg syndrome.  He is on Neurontin . Also, on pramipexole  for restless leg syndrome.  He sees neurology.  Last foot exam 12/06/2022.  He also has a history of HTN, GERD, Barrett's esophagus, iron  deficiency anemia, memory loss.  ROS: + see HPI  Past Medical History:  Diagnosis Date   Arthritis    left thumb (05/23/2017) right shoulder   Barrett's esophagus    we've been told that it's all gone; still take RX for GERD (05/23/2017)   Bilateral swelling of feet and ankles x 3 weeks as of 12-01-2019   CAD (coronary artery disease), native coronary artery    Coronary angiogram 05/03/2014:  Proximal LAD 3.0x12 mm Promus premier stent.  04/08/2014: Mid Cx 3.5 x 16 mm Promus DES.  03/29/2013: Mid LAD 2.75 x 38 mm Promus Premier drug-eluting stent, balloon angioplasty of D1 and distal LAD and stenting of distal RCA with 2.75 x 24 mm promos Premier drug-eluting stent 12/15/2012 widely patent.    Cervicalgia    Chronic bronchitis (HCC)    q yr (05/23/2017)   Complication of anesthesia     I do not wake up very well   slow to wake up   COVID-19 05/2019   all covid symptoms in hospital x 5 days, all symptoms resolved took monoclonal antibody tx    Dyspnea    with exertion   Family history of adverse reaction to anesthesia    son w/PONV   GERD (gastroesophageal reflux disease)    Heart murmur    grew out of it    Hyperlipidemia    Hypertension    Hypoglycemia, unspecified    IDDM (insulin  dependent diabetes mellitus)    type 2   Impotence of organic origin    Iron  deficiency anemia    Memory loss    mild   Other malaise and fatigue    Pneumonia 05/2019   PONV (postoperative nausea and vomiting)    after wisdom teeth pulled, no problems with other surgeries   Restless leg    Rotator cuff arthropathy    Stroke (HCC)    Tension headache    sometimes (05/23/2017)   Unspecified gastritis and gastroduodenitis with hemorrhage    Unstable angina pectoris (HCC) 04/08/2014   Coronary angiogram 05/03/2014:  Proximal LAD 3.0x12 mm Promus premier stent.  04/08/2014: Mid Cx 3.5 x 16 mm Promus DES.  03/29/2013: Mid LAD 2.75 x 38 mm Promus Premier drug-eluting stent, balloon angioplasty of D1 and distal LAD and stenting of distal RCA with 2.75 x 24 mm promos Premier drug-eluting stent 12/15/2012 widely patent.   Past Surgical History:  Procedure Laterality Date   BACK SURGERY Bilateral 05/28/2023   BICEPT TENODESIS Right 12/07/2019   Procedure: RIGHT BICEPS TENODESIS;  Surgeon: Beverley Evalene BIRCH, MD;  Location: [facility];  Service: Orthopedics;  Laterality: Right;   CARDIAC CATHETERIZATION N/A 09/25/2015   Procedure: Left Heart Cath and Coronary Angiography;  Surgeon: Gordy Bergamo, MD;  Location: [facility] INVASIVE CV LAB;  Service: Cardiovascular;  Laterality: N/A;   CARDIAC CATHETERIZATION N/A 09/25/2015   Procedure: Intravascular Pressure Wire/FFR Study;  Surgeon: Gordy Bergamo, MD;  Location: [facility] INVASIVE CV LAB;  Service: Cardiovascular;  Laterality: N/A;   CORONARY ANGIOPLASTY WITH STENT PLACEMENT  12/15/2012; 04/28/2014; 05/03/2014   2; 1; 1 , 4 stents 2 balloons   CORONARY STENT INTERVENTION N/A 05/01/2021   Procedure: CORONARY STENT INTERVENTION;  Surgeon: Bergamo Gordy, MD;  Location: [facility] INVASIVE CV LAB;  Service: Cardiovascular;  Laterality: N/A;   FRACTIONAL FLOW RESERVE WIRE Right 03/26/2013   Procedure: FRACTIONAL FLOW RESERVE WIRE;  Surgeon: Erick JONELLE Bergamo, MD;  Location:  [facility] CATH LAB;  Service: Cardiovascular;  Laterality: Right;   FRACTIONAL FLOW RESERVE WIRE N/A 05/03/2014   Procedure: FRACTIONAL FLOW RESERVE WIRE;  Surgeon: Erick JONELLE Bergamo, MD;  Location: [facility] CATH LAB;  Service: Cardiovascular;  Laterality: N/A;   HERNIA REPAIR  2010   umbilical    INCISION AND DRAINAGE ABSCESS Left 05/2007   groin    KNEE SURGERY Right 1992;  2000   calcium  deposits removed (12/15/2012)   LEFT HEART CATH AND CORONARY ANGIOGRAPHY N/A 05/23/2017   Procedure: LEFT HEART CATH AND CORONARY ANGIOGRAPHY;  Surgeon: Bergamo Heinz, MD;  Location: [facility] INVASIVE CV LAB;  Service: Cardiovascular;  Laterality: N/A;   LEFT HEART CATH AND CORONARY ANGIOGRAPHY N/A 05/01/2021   Procedure: LEFT HEART CATH AND CORONARY ANGIOGRAPHY;  Surgeon: Bergamo Heinz, MD;  Location: [facility] INVASIVE CV LAB;  Service: Cardiovascular;  Laterality: N/A;   LEFT HEART CATHETERIZATION WITH CORONARY ANGIOGRAM N/A 12/15/2012   Procedure: LEFT HEART CATHETERIZATION WITH CORONARY ANGIOGRAM;  Surgeon: Erick JONELLE Bergamo, MD;  Location: [facility] CATH LAB;  Service: Cardiovascular;  Laterality: N/A;   LEFT HEART CATHETERIZATION WITH CORONARY ANGIOGRAM N/A 03/26/2013   Procedure: LEFT HEART CATHETERIZATION WITH CORONARY ANGIOGRAM;  Surgeon: Erick JONELLE Bergamo, MD;  Location: [facility] CATH LAB;  Service: Cardiovascular;  Laterality: N/A;   LEFT HEART CATHETERIZATION WITH CORONARY ANGIOGRAM N/A 04/08/2014   Procedure: LEFT HEART  CATHETERIZATION WITH CORONARY ANGIOGRAM;  Surgeon: Ozell JONETTA Fell, MD;  Location: [facility] CATH LAB;  Service: Cardiovascular;  Laterality: N/A;   MOUTH SURGERY Right    PERCUTANEOUS CORONARY STENT INTERVENTION (PCI-S) N/A 05/03/2014   Procedure: PERCUTANEOUS CORONARY STENT INTERVENTION (PCI-S);  Surgeon: Erick JONELLE Bergamo, MD;  Location: [facility] CATH LAB;  Service: Cardiovascular;  Laterality: N/A;   RADIOLOGY WITH ANESTHESIA N/A 02/08/2021   Procedure: MRI WITH ANESTHESIA OF ABDOMEN WITH AND WITHOUT CONSTRAST;  Surgeon: Radiologist, Medication, MD;  Location: [facility] OR;  Service: Radiology;  Laterality: N/A;   ROTATOR CUFF REPAIR  2022   UMBILICAL HERNIA REPAIR  yrs ago   Social History   Socioeconomic History   Marital status: Married    Spouse name: Pam   Number of children: 2   Years of education: 12   Highest education level: High school graduate  Occupational History   Occupation: Naval architect    Comment: deliveries only now   Occupation: woodworking  Tobacco Use   Smoking status: Never   Smokeless tobacco: Never  Vaping Use   Vaping status: Never Used  Substance and Sexual Activity   Alcohol use: No   Drug use: No   Sexual activity: Not Currently  Other Topics Concern   Not on file  Social History Narrative   Right handed    Caffeine 2 cups daily   Winter time 4 cups daily   Lives family one level home    Semi Retired   Social Drivers of Corporate investment banker Strain: Low Risk  (01/18/2021)   Overall Financial Resource Strain (CARDIA)    Difficulty of Paying Living Expenses: Not very hard  Food Insecurity: No Food Insecurity (01/18/2021)   Hunger Vital Sign    Worried About Running Out of Food in the Last Year: Never true    Ran Out of Food in the Last Year: Never true  Transportation Needs: No Transportation Needs (01/18/2021)   PRAPARE - Administrator, Civil Service (Medical): No    Lack of Transportation (Non-Medical): No  Physical Activity: Inactive  (04/25/2017)  Exercise Vital Sign    Days of Exercise per Week: 0 days    Minutes of Exercise per Session: 0 min  Stress: No Stress Concern Present (04/25/2017)   [employer] of Occupational Health - Occupational Stress Questionnaire    Feeling of Stress : Only a little  Social Connections: Moderately Integrated (04/25/2017)   Social Connection and Isolation Panel    Frequency of Communication with Friends and Family: More than three times a week    Frequency of Social Gatherings with Friends and Family: More than three times a week    Attends Religious Services: More than 4 times per year    Active Member of [employer] or Organizations: No    Attends Banker Meetings: Never    Marital Status: Married  Catering manager Violence: Not At Risk (04/25/2017)   Humiliation, Afraid, Rape, and Kick questionnaire    Fear of Current or Ex-Partner: No    Emotionally Abused: No    Physically Abused: No    Sexually Abused: No   Current Outpatient Medications on File Prior to Visit  Medication Sig Dispense Refill   acetaminophen  (TYLENOL ) 500 MG tablet Take 500 mg by mouth every 6 (six) hours as needed for mild pain, moderate pain, headache or fever.     albuterol  (VENTOLIN  HFA) 108 (90 Base) MCG/ACT inhaler TAKE 2 PUFFS BY MOUTH EVERY 6 HOURS AS NEEDED FOR WHEEZE OR SHORTNESS OF BREATH 6.7 each 2   aspirin  EC 81 MG tablet Take 1 tablet (81 mg total) by mouth daily. Swallow whole. 90 tablet 3   Budeson-Glycopyrrol-Formoterol (BREZTRI  AEROSPHERE) 160-9-4.8 MCG/ACT AERO Inhale 2 puffs into the lungs in the morning and at bedtime. 5.9 g 0   Continuous Blood Gluc Sensor (FREESTYLE LIBRE 14 DAY SENSOR) MISC USE TO TEST BLOOD SUGAR THREE TIMES DAILY. E11.51. E11.59 1 each 12   dapagliflozin  propanediol (FARXIGA ) 10 MG TABS tablet Take 1 tablet (10 mg total) by mouth daily before breakfast. 30 tablet 2   dapagliflozin  propanediol (FARXIGA ) 10 MG TABS tablet Take 1 tablet (10 mg total) by mouth  daily before breakfast. (Patient not taking: Reported on 08/01/2023) 28 tablet 0   diphenhydramine -acetaminophen  (TYLENOL  PM) 25-500 MG TABS tablet Take 1 tablet by mouth at bedtime as needed. (Patient not taking: Reported on 08/01/2023)     fluticasone  (FLONASE ) 50 MCG/ACT nasal spray Place 1 spray into both nostrils daily as needed for allergies or rhinitis. 16 g 11   furosemide  (LASIX ) 40 MG tablet Take 1 tablet (40 mg total) by mouth daily for 3 days. (Patient not taking: Reported on 07/18/2023) 30 tablet 1   gabapentin  (NEURONTIN ) 300 MG capsule TAKE 3 CAPSULES (900 MG TOTAL) BY MOUTH AT BEDTIME. G25.81 270 capsule 0   glucose 4 GM chewable tablet Chew 1 tablet (4 g total) by mouth as needed for low blood sugar. 50 tablet 12   insulin  aspart (NOVOLOG  FLEXPEN) 100 UNIT/ML FlexPen Inject 15-25 Units into the skin 3 (three) times daily with meals. 30 mL 2   insulin  glargine (LANTUS ) 100 UNIT/ML Solostar Pen Inject 40-50 Units into the skin daily. Inject 40-50 Units into the skin at bedtime. (Patient not taking: Reported on 08/01/2023) 45 mL 2   insulin  glargine-yfgn (SEMGLEE ) 100 UNIT/ML Pen SMARTSIG:40-50 Unit(s) SUB-Q Every Night     Insulin  Pen Needle (BD PEN NEEDLE NANO 2ND GEN) 32G X 4 MM MISC USE AS DIRECTED 100 each 3   LIFESCAN FINEPOINT LANCETS MISC Use to test for blood sugar  three times daily dx: E11.51 300 each 1   metoprolol  succinate (TOPROL -XL) 25 MG 24 hr tablet TAKE 1 TABLET BY MOUTH EVERY DAY WITH OR IMMEDIATELY FOLLOWING A MEAL 90 tablet 1   nitroGLYCERIN  (NITROSTAT ) 0.4 MG SL tablet PLACE 1 TABLET UNDER THE TONGUE EVERY 5 MINUTES AS NEEDED FOR CHEST PAIN. 50 tablet 2   ONETOUCH ULTRA test strip USE TO TEST FOR BLOOD SUGAR THREE TIMES DAILY DX: E11.51 300 strip 11   pantoprazole  (PROTONIX ) 40 MG tablet TAKE ONE TABLET BY MOUTH ONCE DAILY FOR STOMACH 90 tablet 3   PRALUENT  150 MG/ML SOAJ INJECT 1 PEN INTO THE SKIN EVERY 14 (FOURTEEN) DAYS. 6 mL 1   pramipexole  (MIRAPEX ) 0.5 MG tablet  Take 0.25-0.5 mg by mouth as needed.     tirzepatide  (MOUNJARO ) 2.5 MG/0.5ML Pen Inject 2.5 mg into the skin once a week.     tiZANidine  (ZANAFLEX ) 4 MG tablet Take 1 tablet (4 mg total) by mouth every 8 (eight) hours as needed. (Patient not taking: Reported on 08/01/2023) 30 tablet 11   valsartan -hydrochlorothiazide  (DIOVAN  HCT) 160-25 MG tablet Take 1 tablet by mouth daily. 90 tablet 3   Current Facility-Administered Medications on File Prior to Visit  Medication Dose Route Frequency Provider Last Rate Last Admin   [COMPLETED] hydrALAZINE  (APRESOLINE ) injection 10 mg  10 mg Intravenous Once Ganji, Jay, MD   10 mg at 05/01/21 1054   Allergies  Allergen Reactions   Amlodipine  Other (See Comments)    Worsening restless legs, dyspnea and leg swelling   Lyrica  [Pregabalin ] Other (See Comments)    Made patient very lethargic the next morning, very hard to patient to function, move, etc.    Other Other (See Comments)    Convulsions, Causes Restless Legs, rhabdomyolysis   Statins Other (See Comments)    Causes Restless Legs, rhabdomyolysis   Ativan  [Lorazepam ] Other (See Comments)    Markedly increased restless legs   Trulicity  [Dulaglutide ] Other (See Comments)    GI side effects    Family History  Adopted: Yes  Problem Relation Age of Onset   Emphysema Mother    PE: BP 120/68   Pulse 71   Ht 5' 8 (1.727 m)   Wt 212 lb (96.2 kg)   SpO2 97%   BMI 32.23 kg/m  Wt Readings from Last 10 Encounters:  09/02/23 212 lb (96.2 kg)  08/01/23 207 lb (93.9 kg)  07/18/23 208 lb (94.3 kg)  06/09/23 213 lb 12.8 oz (97 kg)  05/20/23 209 lb 9.6 oz (95.1 kg)  04/28/23 211 lb 12.8 oz (96.1 kg)  04/25/23 209 lb (94.8 kg)  04/03/23 200 lb 3.2 oz (90.8 kg)  03/17/23 203 lb (92.1 kg)  01/29/23 199 lb 9.6 oz (90.5 kg)   Constitutional: overweight, in NAD, in wheelchair Eyes: no exophthalmos ENT: no thyromegaly, no cervical lymphadenopathy Cardiovascular: RRR, No MRG Respiratory: CTA  B Musculoskeletal: no deformities Skin:  no rashes Neurological: no tremor with outstretched hands  ASSESSMENT: 1. DM2, insulin -dependent, uncontrolled, with complications - CAD - h/o CVA - CKD stage III - severe hypoglycemia in 2019  2. HL  PLAN:  1. Patient with longstanding, uncontrolled, type 2 diabetes, on basal/bolus insulin  regimen, with improved control at last visit.  At that time, HbA1c was lower, at 7.5%, decreased from 9.5%.  Sugars appears to be significantly improved, mostly fluctuating within the target range with only occasional hyperglycemic excursions either after correction of a low blood sugar or after meals.  Highest blood  sugars were after lunch.  He was still on NovoLog  but planning to switch to Fiasp  after he was running out.  He was trying to take the NovoLog  before meals but was sometimes forgetting and taking them only after sugars were high after meals.  He was taking a high dose of Fiasp  when the sugars were above 200, approximately 30 units.  We discussed that this is too much for correction and I recommended to go by the sliding scale to avoid hypoglycemia after meals.  I also recommended that if he forgot to take the insulin  and he was only able to take it after meals, to reduce the dose of NovoLog  by approximately 50%. CGM interpretation: -At today's visit, we reviewed his CGM downloads: It appears that 38% of values are in target range (goal >70%), while 60% are higher than 180 (goal <25%), and 1% are lower than 70 (goal <4%).  The calculated average blood sugar is 201.  The projected HbA1c for the next 3 months (GMI) is 8.1%. -Reviewing the CGM trends, sugars appear to be quite fluctuating, increasing after every meal and being very variable overnight.  They have been gradually increasing since May, after coming off Mounjaro .  Wife is also reporting that earlier in the year, they were writing down everything that he ate and how much insulin  he injected to learn  how much to inject for similar meals in the future.  However, they recently stopped doing this but plan to return to this practice.  This is a good idea and this may help understanding his insulin  requirements.  He had significant nausea with Mounjaro , as previously with Ozempic  and Trulicity , so for now, we will not use GLP-1 receptor agonist for him.  For now I recommended to continue to try to inject insulin  15 minutes before meals and I sent a new prescription for Farxiga  to the pharmacy.  This was started by Dr. Ladona 2 months ago and he started it but then ran out and could not refill it due to price.  He will also start physical therapy and we discussed that physical activity may also help his blood sugars. - I suggested to:  Patient Instructions  Please continue: - Basaglar  30 units daily - NovoLog  10-30 units before meals - NovoLog  sliding scale:             150-175: +4 176-200: +6 201-250: +10 251-300: +14 >300: +18 If you have to take the insulin  after a meal, take only 50% of the dose. No NovoLog  at bedtime.  Try to restart: - Farxiga  10 mg before b'fast   Please return in 3 months.  - we checked his HbA1c: 7.5% (stable) - advised to check sugars at different times of the day - 4x a day, rotating check times - advised for yearly eye exams >> he is UTD - return to clinic in 3 months  2. HL - Latest lipid panel reviewed from 06/2023: All fractions at goal: Lab Results  Component Value Date   CHOL 102 06/09/2023   HDL 43 06/09/2023   LDLCALC 38 06/09/2023   LDLDIRECT 110 (H) 06/17/2019   TRIG 114 06/09/2023   CHOLHDL 2.4 06/09/2023  - She had side effects from statins: Restless legs and rhabdomyolysis.  He is on Praluent  now, without side effects.  Lela Fendt, MD PhD [facility]

## 2023-09-02 NOTE — Patient Instructions (Addendum)
 Please continue: - Basaglar  30 units daily - NovoLog  10-30 units before meals - NovoLog  sliding scale:             150-175: +4 176-200: +6 201-250: +10 251-300: +14 >300: +18 If you have to take the insulin  after a meal, take only 50% of the dose. No NovoLog  at bedtime.  Try to restart: - Farxiga  10 mg before b'fast   Please return in 3 months.

## 2023-09-02 NOTE — Addendum Note (Signed)
 Addended by: CLEOTILDE ROLIN RAMAN on: 09/02/2023 03:13 PM   Modules accepted: Orders

## 2023-09-12 ENCOUNTER — Other Ambulatory Visit: Payer: Self-pay | Admitting: Internal Medicine

## 2023-09-12 ENCOUNTER — Telehealth: Payer: Self-pay | Admitting: Pharmacy Technician

## 2023-09-12 ENCOUNTER — Ambulatory Visit: Attending: Cardiology | Admitting: Cardiology

## 2023-09-12 ENCOUNTER — Encounter: Payer: Self-pay | Admitting: Cardiology

## 2023-09-12 ENCOUNTER — Other Ambulatory Visit (HOSPITAL_COMMUNITY): Payer: Self-pay

## 2023-09-12 VITALS — BP 111/66 | HR 63 | Resp 16 | Ht 68.0 in | Wt 210.0 lb

## 2023-09-12 DIAGNOSIS — I5032 Chronic diastolic (congestive) heart failure: Secondary | ICD-10-CM | POA: Diagnosis not present

## 2023-09-12 DIAGNOSIS — G4733 Obstructive sleep apnea (adult) (pediatric): Secondary | ICD-10-CM | POA: Diagnosis not present

## 2023-09-12 DIAGNOSIS — I25118 Atherosclerotic heart disease of native coronary artery with other forms of angina pectoris: Secondary | ICD-10-CM | POA: Insufficient documentation

## 2023-09-12 DIAGNOSIS — G2581 Restless legs syndrome: Secondary | ICD-10-CM | POA: Diagnosis not present

## 2023-09-12 DIAGNOSIS — E7800 Pure hypercholesterolemia, unspecified: Secondary | ICD-10-CM | POA: Diagnosis not present

## 2023-09-12 MED ORDER — PRALUENT 150 MG/ML ~~LOC~~ SOAJ
150.0000 mg | SUBCUTANEOUS | 3 refills | Status: DC
  Filled 2023-09-12: qty 6, 84d supply, fill #0

## 2023-09-12 NOTE — Telephone Encounter (Signed)
 Praluent  is not on the formulary per his insurance and is plan/benefit exclusion now. He was on repatha  in the past per note on 02/08/2022 but no notes as to why he went back to praluent . Repatha  needs a prior authorization but it is on the formulary.  Submitted prior auth for repatha  and then will reach out to provider to see if ok.  Pharmacy Patient Advocate Encounter   Received notification from [facility] pharmacy that prior authorization for repatha  is required/requested.   Insurance verification completed.   The patient is insured through [facility] .   Per test claim: PA required; PA submitted to above mentioned insurance via latent Key/confirmation #/EOC [id number] Status is pending

## 2023-09-12 NOTE — Progress Notes (Addendum)
 Cardiology Office Note:  .   Date:  09/17/2023  ID:  Kenneth Morones Sr., DOB 10-Jul-1950, MRN [id number] PCP: Caro Harlene POUR, NP  [facility] Providers Cardiologist:  Gordy Bergamo, MD   History of Present Illness: .   Kenneth Skillin Sr. is a 73 y.o. Caucasian male with coronary artery disease, with stents to his LAD, RCA and circumflex, remote balloon angioplasty to distal LAD and diagonal has remained patent, hypertension, hyperlipidemia, GERD, Barrett's esophagus, statin intolerance, uncontrolled diabetes mellitus, severe restless leg syndrome.  He was treated for acute on chronic diastolic heart failure on 06/09/2023 in the outpatient basis, Farxiga  was added in June presents for follow-up.  2 to 3 weeks previously he underwent microdiscectomy.  He has stopped taking Mounjaro  as he felt sick.  Discussed the use of AI scribe software for clinical note transcription with the patient, who gave verbal consent to proceed.  History of Present Illness Kenneth Obryan Sr. is a 73 year old male with heart failure and severe sleep apnea who presents with worsening fatigue and shortness of breath. He is accompanied by his wife. He experiences worsening fatigue and shortness of breath, with difficulty performing daily activities like walking. These symptoms have persisted for about a month, with a decline in energy levels since three months ago. Moderate leg swelling is present, fluctuating in severity.  He occasionally experiences chest pain, including an episode this morning, though it is not his primary concern. He is on Farxiga  for fluid retention but has not been taking Mounjaro  due to side effects.  Severe sleep apnea was diagnosed in February 2024, with an AHI of 41. He does not use a CPAP machine yet, but his wife reports gasping for breath during sleep, contributing to his fatigue.   Labs   Lab Results  Component Value Date   CHOL 102 06/09/2023   HDL 43 06/09/2023   LDLCALC 38 06/09/2023    LDLDIRECT 110 (H) 06/17/2019   TRIG 114 06/09/2023   CHOLHDL 2.4 06/09/2023   No results found for: LIPOA  Lab Results  Component Value Date   NA 141 06/09/2023   K 4.6 06/09/2023   CO2 27 06/09/2023   GLUCOSE 106 (H) 06/09/2023   BUN 21 06/09/2023   CREATININE 1.13 06/09/2023   CALCIUM  9.5 06/09/2023   GFR 51.21 (L) 10/22/2021   EGFR 69 06/09/2023   GFRNONAA 45 (L) 12/23/2022      Latest Ref Rng & Units 06/09/2023    4:01 PM 03/20/2023    8:09 AM 12/23/2022   11:53 PM  BMP  Glucose 70 - 99 mg/dL 893  798  860   BUN 8 - 27 mg/dL 21  26  39   Creatinine 0.76 - 1.27 mg/dL 8.86  8.96  8.37   BUN/Creat Ratio 10 - 24 19  25     Sodium 134 - 144 mmol/L 141  135  133   Potassium 3.5 - 5.2 mmol/L 4.6  5.1  4.6   Chloride 96 - 106 mmol/L 99  97  98   CO2 20 - 29 mmol/L 27  29  26    Calcium  8.6 - 10.2 mg/dL 9.5  9.9  9.5       Latest Ref Rng & Units 03/20/2023    8:09 AM 12/24/2022    3:35 AM 07/22/2022    9:03 AM  CBC  WBC 3.8 - 10.8 Thousand/uL 10.0  9.1  4.8   Hemoglobin 13.2 - 17.1 g/dL 86.1  15.7  15.2   Hematocrit 38.5 - 50.0 % 42.0  46.6  44.5   Platelets 140 - 400 Thousand/uL 162  124  124    Lab Results  Component Value Date   HGBA1C 7.5 (A) 09/02/2023    Lab Results  Component Value Date   TSH 2.684 01/15/2021     ROS  Review of Systems  Constitutional: Positive for malaise/fatigue.  Cardiovascular:  Positive for chest pain, dyspnea on exertion and leg swelling. Negative for orthopnea and syncope.   Physical Exam:   VS:  BP 111/66 (BP Location: Left Arm, Patient Position: Sitting, Cuff Size: Large)   Pulse 63   Resp 16   Ht 5' 8 (1.727 m)   Wt 210 lb (95.3 kg)   SpO2 95%   BMI 31.93 kg/m    Wt Readings from Last 3 Encounters:  09/12/23 210 lb (95.3 kg)  09/02/23 212 lb (96.2 kg)  08/01/23 207 lb (93.9 kg)    Physical Exam Neck:     Vascular: JVD present. No carotid bruit.  Cardiovascular:     Rate and Rhythm: Normal rate and regular rhythm.      Pulses:          Dorsalis pedis pulses are 0 on the right side and 0 on the left side.       Posterior tibial pulses are 0 on the right side and 0 on the left side.     Heart sounds: S1 normal and S2 normal. Murmur heard.     Early systolic murmur is present with a grade of 2/6 radiating to the apex.     No gallop.  Pulmonary:     Effort: Pulmonary effort is normal.     Breath sounds: Normal breath sounds.  Abdominal:     General: Bowel sounds are normal.     Palpations: Abdomen is soft.  Musculoskeletal:     Right lower leg: Edema (2 + below knee pitting edema) present.     Left lower leg: Edema (2 + below knee pitting edema) present.    Studies Reviewed: SABRA    Sleep study Feb 2024: Severe OSA with 41 AHI EKG:    EKG Interpretation Date/Time:  Friday September 12 2023 09:07:53 EDT Ventricular Rate:  62 PR Interval:  144 QRS Duration:  108 QT Interval:  470 QTC Calculation: 477 R Axis:   -34  Text Interpretation: EKG 09/12/2023: Normal sinus rhythm at rate of 62 bpm, left anterior fascicular block.  Incomplete right bundle branch block.  LVH.  T wave abnormality, cannot exclude inferior ischemia.  Compared to 02/22/2024, no change. Confirmed by Kemora Pinard, Jagadeesh ([id number]) on 09/12/2023 9:47:46 AM    Medications ordered    Meds ordered this encounter  Medications   DISCONTD: Alirocumab  (PRALUENT ) 150 MG/ML SOAJ    Sig: Inject 1 mL (150 mg total) into the skin every 14 (fourteen) days.    Dispense:  6 mL    Refill:  3   Evolocumab  (REPATHA  SURECLICK) 140 MG/ML SOAJ    Sig: Inject 140 mg into the skin every 14 (fourteen) days.    Dispense:  6 mL    Refill:  3     ASSESSMENT AND PLAN: .      ICD-10-CM   1. Chronic diastolic congestive heart failure (HCC)  I50.32     2. Coronary artery disease of native artery of native heart with stable angina pectoris (HCC)  I25.118 EKG 12-Lead    Evolocumab  (REPATHA  SURECLICK) 140 MG/ML  SOAJ    DISCONTINUED: Alirocumab  (PRALUENT ) 150 MG/ML  SOAJ    3. OSA (obstructive sleep apnea)  G47.33     4. Restless leg syndrome  G25.81     5. Pure hypercholesterolemia  E78.00 Evolocumab  (REPATHA  SURECLICK) 140 MG/ML SOAJ    DISCONTINUED: Alirocumab  (PRALUENT ) 150 MG/ML SOAJ     Assessment & Plan Chronic diastolic heart failure Heart failure with symptoms of fatigue, shortness of breath, and intermittent chest pain, worsened over the past month. EKG shows no change. Chest pain not severe enough for repeat heart catheterization. Possible exacerbation by untreated severe obstructive sleep apnea. - Continue current heart failure medications - Use nitroglycerin  as needed for chest pain - Follow up with sleep specialist to address sleep apnea, which may improve heart failure symptoms  Severe obstructive sleep apnea Severe obstructive sleep apnea diagnosed in February 2024 with an AHI of 41. Symptoms include fatigue, daytime sleepiness, and exacerbation of restless leg syndrome. Untreated sleep apnea may worsen heart failure and diabetes. CPAP therapy recommended and patient has initiated use, though struggling with adherence. - Follow up with sleep specialist to initiate CPAP therapy - Educate on the importance of CPAP therapy for improving overall health and reducing symptoms - Encourage use of CPAP despite initial discomfort  Restless legs syndrome Restless legs syndrome likely exacerbated by untreated severe obstructive sleep apnea. Symptoms include leg pain and restlessness, particularly at night. - Initiate CPAP therapy to address underlying sleep apnea, which may improve restless legs syndrome  Diabetes mellitus Diabetes management may be affected by untreated severe obstructive sleep apnea. Current medications include Farxiga . Mounjaro  discontinued due to side effects. Proper management of sleep apnea may improve diabetes control. Reinitiation of Mounjaro  advised with dietary modifications to minimize side effects. - Reinitiate  Mounjaro  with dietary modifications to minimize side effects, slow eating, avoidance of fried food and fatty food - Continue Farxiga   Medication management Praluent  prescription needed. Consider switching to a local pharmacy for cost efficiency and better coordination with [facility]. - Send prescription for Praluent  to local pharmacy - Coronary artery disease is well-managed and lipids are at goal.  Office visit in 6 months or sooner if he has recurrence of heart failure symptoms or chest pain.  His wife present and all questions answered.   Signed,  Gordy Bergamo, MD, [facility] 09/17/2023, 5:56 PM [facility] [address] [place], [place] [address] Phone: [phone]. Fax:  [phone]

## 2023-09-12 NOTE — Patient Instructions (Signed)
 Medication Instructions:  Your physician recommends that you continue on your current medications as directed. Please refer to the Current Medication list given to you today.  *If you need a refill on your cardiac medications before your next appointment, please call your pharmacy*  Lab Work: none If you have labs (blood work) drawn today and your tests are completely normal, you will receive your results only by: MyChart Message (if you have MyChart) OR A paper copy in the mail If you have any lab test that is abnormal or we need to change your treatment, we will call you to review the results.  Testing/Procedures: none  Follow-Up: At [facility], you and your health needs are our priority.  As part of our continuing mission to provide you with exceptional heart care, our providers are all part of one team.  This team includes your primary Cardiologist (physician) and Advanced Practice Providers or APPs (Physician Assistants and Nurse Practitioners) who all work together to provide you with the care you need, when you need it.  Your next appointment:   6 month(s)  Provider:   Knox Perl, MD    We recommend signing up for the patient portal called "MyChart".  Sign up information is provided on this After Visit Summary.  MyChart is used to connect with patients for Virtual Visits (Telemedicine).  Patients are able to view lab/test results, encounter notes, upcoming appointments, etc.  Non-urgent messages can be sent to your provider as well.   To learn more about what you can do with MyChart, go to [email or web address].   Other Instructions

## 2023-09-12 NOTE — Telephone Encounter (Signed)
 Prior authorization for repatha  140mg  sureclick has been approved from 09/12/23-09/11/24. Can the patient be changed to repatha  please? If yes, can the prescription for repatha  please be sent to the [facility] pharmacy? Thank you!

## 2023-09-15 ENCOUNTER — Other Ambulatory Visit (HOSPITAL_COMMUNITY): Payer: Self-pay

## 2023-09-15 DIAGNOSIS — M5126 Other intervertebral disc displacement, lumbar region: Secondary | ICD-10-CM | POA: Diagnosis not present

## 2023-09-17 ENCOUNTER — Other Ambulatory Visit (HOSPITAL_COMMUNITY): Payer: Self-pay

## 2023-09-17 MED ORDER — MOUNJARO 2.5 MG/0.5ML ~~LOC~~ SOAJ
2.5000 mg | SUBCUTANEOUS | 3 refills | Status: DC
  Filled 2023-09-17 – 2023-12-15 (×4): qty 2, 28d supply, fill #0
  Filled 2024-01-30: qty 2, 28d supply, fill #1

## 2023-09-17 MED ORDER — REPATHA SURECLICK 140 MG/ML ~~LOC~~ SOAJ
140.0000 mg | SUBCUTANEOUS | 3 refills | Status: AC
  Filled 2023-09-17: qty 6, 84d supply, fill #0

## 2023-09-17 NOTE — Addendum Note (Signed)
 Addended by: LADONA MILAN on: 09/17/2023 05:56 PM   Modules accepted: Orders

## 2023-09-17 NOTE — Telephone Encounter (Signed)
 Repatha  prescription has been sent to pharmacy.  Per DPR is it OK to leave message on voicemail.  Left message that prescription for cholesterol medication that should be covered by his insurance has been sent to the pharmacy and to call if any questions.

## 2023-09-18 ENCOUNTER — Other Ambulatory Visit (HOSPITAL_COMMUNITY): Payer: Self-pay

## 2023-09-19 ENCOUNTER — Other Ambulatory Visit (HOSPITAL_COMMUNITY): Payer: Self-pay

## 2023-09-23 DIAGNOSIS — M5126 Other intervertebral disc displacement, lumbar region: Secondary | ICD-10-CM | POA: Diagnosis not present

## 2023-09-25 ENCOUNTER — Other Ambulatory Visit: Payer: Self-pay | Admitting: Nurse Practitioner

## 2023-09-25 DIAGNOSIS — G2581 Restless legs syndrome: Secondary | ICD-10-CM

## 2023-09-29 ENCOUNTER — Encounter: Payer: Self-pay | Admitting: Nurse Practitioner

## 2023-09-29 ENCOUNTER — Ambulatory Visit: Payer: Medicare Other | Admitting: Nurse Practitioner

## 2023-09-29 VITALS — BP 110/76 | HR 66 | Temp 97.6°F | Resp 11 | Ht 68.0 in | Wt 206.8 lb

## 2023-09-29 DIAGNOSIS — I25118 Atherosclerotic heart disease of native coronary artery with other forms of angina pectoris: Secondary | ICD-10-CM | POA: Diagnosis not present

## 2023-09-29 DIAGNOSIS — G2581 Restless legs syndrome: Secondary | ICD-10-CM

## 2023-09-29 DIAGNOSIS — R600 Localized edema: Secondary | ICD-10-CM

## 2023-09-29 DIAGNOSIS — D509 Iron deficiency anemia, unspecified: Secondary | ICD-10-CM

## 2023-09-29 DIAGNOSIS — E1165 Type 2 diabetes mellitus with hyperglycemia: Secondary | ICD-10-CM

## 2023-09-29 DIAGNOSIS — I10 Essential (primary) hypertension: Secondary | ICD-10-CM | POA: Diagnosis not present

## 2023-09-29 DIAGNOSIS — K219 Gastro-esophageal reflux disease without esophagitis: Secondary | ICD-10-CM

## 2023-09-29 DIAGNOSIS — E7800 Pure hypercholesterolemia, unspecified: Secondary | ICD-10-CM | POA: Diagnosis not present

## 2023-09-29 DIAGNOSIS — Z794 Long term (current) use of insulin: Secondary | ICD-10-CM | POA: Diagnosis not present

## 2023-09-29 LAB — COMPREHENSIVE METABOLIC PANEL WITH GFR
AG Ratio: 1.8 (calc) (ref 1.0–2.5)
ALT: 29 U/L (ref 9–46)
AST: 24 U/L (ref 10–35)
Albumin: 4.4 g/dL (ref 3.6–5.1)
Alkaline phosphatase (APISO): 51 U/L (ref 35–144)
BUN/Creatinine Ratio: 20 (calc) (ref 6–22)
BUN: 30 mg/dL — ABNORMAL HIGH (ref 7–25)
CO2: 31 mmol/L (ref 20–32)
Calcium: 9.7 mg/dL (ref 8.6–10.3)
Chloride: 100 mmol/L (ref 98–110)
Creat: 1.5 mg/dL — ABNORMAL HIGH (ref 0.70–1.28)
Globulin: 2.5 g/dL (ref 1.9–3.7)
Glucose, Bld: 60 mg/dL — ABNORMAL LOW (ref 65–99)
Potassium: 4.4 mmol/L (ref 3.5–5.3)
Sodium: 139 mmol/L (ref 135–146)
Total Bilirubin: 0.5 mg/dL (ref 0.2–1.2)
Total Protein: 6.9 g/dL (ref 6.1–8.1)
eGFR: 49 mL/min/1.73m2 — ABNORMAL LOW (ref >=60)

## 2023-09-29 LAB — CBC WITH DIFFERENTIAL/PLATELET
Absolute Lymphocytes: 1191 {cells}/uL (ref 850–3900)
Absolute Monocytes: 630 {cells}/uL (ref 200–950)
Basophils Absolute: 32 {cells}/uL (ref 0–200)
Basophils Relative: 0.5 %
Eosinophils Absolute: 170 {cells}/uL (ref 15–500)
Eosinophils Relative: 2.7 %
HCT: 44.7 % (ref 38.5–50.0)
Hemoglobin: 14.8 g/dL (ref 13.2–17.1)
MCH: 29.7 pg (ref 27.0–33.0)
MCHC: 33.1 g/dL (ref 32.0–36.0)
MCV: 89.8 fL (ref 80.0–100.0)
MPV: 10.8 fL (ref 7.5–12.5)
Monocytes Relative: 10 %
Neutro Abs: 4278 {cells}/uL (ref 1500–7800)
Neutrophils Relative %: 67.9 %
Platelets: 162 Thousand/uL (ref 140–400)
RBC: 4.98 Million/uL (ref 4.20–5.80)
RDW: 13 % (ref 11.0–15.0)
Total Lymphocyte: 18.9 %
WBC: 6.3 Thousand/uL (ref 3.8–10.8)

## 2023-09-29 NOTE — Progress Notes (Unsigned)
 Careteam: Patient Care Team: Kenneth Harlene POUR, NP as PCP - General (Geriatric Medicine) Ladona Heinz, MD as PCP - Cardiology (Cardiology) Georjean Darice HERO, MD as Consulting Physician (Neurology) [name], [name], OD (Optometry) Neda Jennet LABOR, MD as Consulting Physician (Pulmonary Disease) Trixie File, MD as Consulting Physician (Internal Medicine)  PLACE OF SERVICE:  [facility]  Advanced Directive information    Allergies  Allergen Reactions   Amlodipine  Other (See Comments)    Worsening restless legs, dyspnea and leg swelling   Lyrica  [Pregabalin ] Other (See Comments)    Made patient very lethargic the next morning, very hard to patient to function, move, etc.    Other Other (See Comments)    Convulsions, Causes Restless Legs, rhabdomyolysis   Statins Other (See Comments)    Causes Restless Legs, rhabdomyolysis   Ativan  [Lorazepam ] Other (See Comments)    Markedly increased restless legs   Trulicity  [Dulaglutide ] Other (See Comments)    GI side effects     Chief Complaint  Patient presents with   Medical Management of Chronic Issues    6 month follow up// pt stated swelling is starting back up and has been shaking all the time.     HPI:  Discussed the use of AI scribe software for clinical note transcription with the patient, who gave verbal consent to proceed.  History of Present Illness Kenneth Teicher Sr. is a 73 year old male with diastolic heart failure and type 2 diabetes who presents for a six-month follow-up.  He recently saw a cardiologist and an endocrinologist and was started on a new medication regimen, including Mounjaro . The first dose caused significant fatigue and lethargy, requiring rest for three days, but he tolerated the second dose well and plans to continue the medication. His blood sugars have been fluctuating, with periods of stability followed by spikes and crashes. He is monitoring his blood sugars regularly and experiences constipation,  which he attributes to the new medication.  He has a history of diastolic heart failure and is currently taking Farxiga , metoprolol , and valsartan  hydrochlorothiazide . He reports some swelling in his legs, particularly in the evenings, and uses compression socks to manage this, although he finds some too tight. He elevates his legs at night but not during the day.  He has severe sleep apnea and has not yet started using a CPAP machine but has an appointment scheduled for next month. He experiences symptoms of restless leg syndrome and is currently taking Pramipexole  for this condition. He is currently taking Pramipexole  for this condition.  He has a history of iron  deficiency anemia but is not currently on iron  supplements. He consumes iron -rich foods like beef but not many green leafy vegetables.  He recently switched from Praluent  to Repatha  for cholesterol management, and his cholesterol levels are reportedly good. He is due for blood work to check his hemoglobin, kidney function, and liver function.  ***  Review of Systems:  ROS***  Past Medical History:  Diagnosis Date   Arthritis    left thumb (05/23/2017) right shoulder   Barrett's esophagus    we've been told that it's all gone; still take RX for GERD (05/23/2017)   Bilateral swelling of feet and ankles x 3 weeks as of 12-01-2019   CAD (coronary artery disease), native coronary artery    Coronary angiogram 05/03/2014:  Proximal LAD 3.0x12 mm Promus premier stent.  04/08/2014: Mid Cx 3.5 x 16 mm Promus DES.  03/29/2013: Mid LAD 2.75 x 38 mm Promus Premier  drug-eluting stent, balloon angioplasty of D1 and distal LAD and stenting of distal RCA with 2.75 x 24 mm promos Premier drug-eluting stent 12/15/2012 widely patent.   Cervicalgia    Chronic bronchitis (HCC)    q yr (05/23/2017)   Complication of anesthesia     I do not wake up very well   slow to wake up   COVID-19 05/2019   all covid symptoms in hospital x 5 days, all symptoms  resolved took monoclonal antibody tx    Dyspnea    with exertion   Family history of adverse reaction to anesthesia    son w/PONV   GERD (gastroesophageal reflux disease)    Heart murmur    grew out of it    Hyperlipidemia    Hypertension    Hypoglycemia, unspecified    IDDM (insulin  dependent diabetes mellitus)    type 2   Impotence of organic origin    Iron  deficiency anemia    Memory loss    mild   Other malaise and fatigue    Pneumonia 05/2019   PONV (postoperative nausea and vomiting)    after wisdom teeth pulled, no problems with other surgeries   Restless leg    Rotator cuff arthropathy    Stroke (HCC)    Tension headache    sometimes (05/23/2017)   Unspecified gastritis and gastroduodenitis with hemorrhage    Unstable angina pectoris (HCC) 04/08/2014   Coronary angiogram 05/03/2014:  Proximal LAD 3.0x12 mm Promus premier stent.  04/08/2014: Mid Cx 3.5 x 16 mm Promus DES.  03/29/2013: Mid LAD 2.75 x 38 mm Promus Premier drug-eluting stent, balloon angioplasty of D1 and distal LAD and stenting of distal RCA with 2.75 x 24 mm promos Premier drug-eluting stent 12/15/2012 widely patent.   Past Surgical History:  Procedure Laterality Date   BACK SURGERY Bilateral 05/28/2023   BICEPT TENODESIS Right 12/07/2019   Procedure: RIGHT BICEPS TENODESIS;  Surgeon: Beverley Evalene BIRCH, MD;  Location: [facility];  Service: Orthopedics;  Laterality: Right;   CARDIAC CATHETERIZATION N/A 09/25/2015   Procedure: Left Heart Cath and Coronary Angiography;  Surgeon: Gordy Bergamo, MD;  Location: [facility] INVASIVE CV LAB;  Service: Cardiovascular;  Laterality: N/A;   CARDIAC CATHETERIZATION N/A 09/25/2015   Procedure: Intravascular Pressure Wire/FFR Study;  Surgeon: Gordy Bergamo, MD;  Location: [facility] INVASIVE CV LAB;  Service: Cardiovascular;  Laterality: N/A;   CORONARY ANGIOPLASTY WITH STENT PLACEMENT  12/15/2012; 04/28/2014; 05/03/2014   2; 1; 1 , 4 stents 2 balloons   CORONARY STENT  INTERVENTION N/A 05/01/2021   Procedure: CORONARY STENT INTERVENTION;  Surgeon: Bergamo Gordy, MD;  Location: [facility] INVASIVE CV LAB;  Service: Cardiovascular;  Laterality: N/A;   FRACTIONAL FLOW RESERVE WIRE Right 03/26/2013   Procedure: FRACTIONAL FLOW RESERVE WIRE;  Surgeon: Erick JONELLE Bergamo, MD;  Location: [facility] CATH LAB;  Service: Cardiovascular;  Laterality: Right;   FRACTIONAL FLOW RESERVE WIRE N/A 05/03/2014   Procedure: FRACTIONAL FLOW RESERVE WIRE;  Surgeon: Erick JONELLE Bergamo, MD;  Location: [facility] CATH LAB;  Service: Cardiovascular;  Laterality: N/A;   HERNIA REPAIR  2010   umbilical    INCISION AND DRAINAGE ABSCESS Left 05/2007   groin    KNEE SURGERY Right 1992;  2000   calcium  deposits removed (12/15/2012)   LEFT HEART CATH AND CORONARY ANGIOGRAPHY N/A 05/23/2017   Procedure: LEFT HEART CATH AND CORONARY ANGIOGRAPHY;  Surgeon: Bergamo Gordy, MD;  Location: [facility] INVASIVE CV LAB;  Service: Cardiovascular;  Laterality: N/A;  LEFT HEART CATH AND CORONARY ANGIOGRAPHY N/A 05/01/2021   Procedure: LEFT HEART CATH AND CORONARY ANGIOGRAPHY;  Surgeon: Ladona Heinz, MD;  Location: [facility] INVASIVE CV LAB;  Service: Cardiovascular;  Laterality: N/A;   LEFT HEART CATHETERIZATION WITH CORONARY ANGIOGRAM N/A 12/15/2012   Procedure: LEFT HEART CATHETERIZATION WITH CORONARY ANGIOGRAM;  Surgeon: Erick JONELLE Ladona, MD;  Location: [facility] CATH LAB;  Service: Cardiovascular;  Laterality: N/A;   LEFT HEART CATHETERIZATION WITH CORONARY ANGIOGRAM N/A 03/26/2013   Procedure: LEFT HEART CATHETERIZATION WITH CORONARY ANGIOGRAM;  Surgeon: Erick JONELLE Ladona, MD;  Location: [facility] CATH LAB;  Service: Cardiovascular;  Laterality: N/A;   LEFT HEART CATHETERIZATION WITH CORONARY ANGIOGRAM N/A 04/08/2014   Procedure: LEFT HEART CATHETERIZATION WITH CORONARY ANGIOGRAM;  Surgeon: Ozell JONETTA Fell, MD;  Location: [facility] CATH LAB;  Service: Cardiovascular;  Laterality: N/A;   MOUTH SURGERY Right    PERCUTANEOUS CORONARY STENT INTERVENTION (PCI-S)  N/A 05/03/2014   Procedure: PERCUTANEOUS CORONARY STENT INTERVENTION (PCI-S);  Surgeon: Erick JONELLE Ladona, MD;  Location: [facility] CATH LAB;  Service: Cardiovascular;  Laterality: N/A;   RADIOLOGY WITH ANESTHESIA N/A 02/08/2021   Procedure: MRI WITH ANESTHESIA OF ABDOMEN WITH AND WITHOUT CONSTRAST;  Surgeon: Radiologist, Medication, MD;  Location: [facility] OR;  Service: Radiology;  Laterality: N/A;   ROTATOR CUFF REPAIR  2022   UMBILICAL HERNIA REPAIR  yrs ago   Social History:   reports that he has never smoked. He has never used smokeless tobacco. He reports that he does not drink alcohol and does not use drugs.  Family History  Adopted: Yes  Problem Relation Age of Onset   Emphysema Mother     Medications: Patient's Medications  New Prescriptions   No medications on file  Previous Medications   ACETAMINOPHEN  (TYLENOL ) 500 MG TABLET    Take 500 mg by mouth every 6 (six) hours as needed for mild pain, moderate pain, headache or fever.   ALBUTEROL  (VENTOLIN  HFA) 108 (90 BASE) MCG/ACT INHALER    TAKE 2 PUFFS BY MOUTH EVERY 6 HOURS AS NEEDED FOR WHEEZE OR SHORTNESS OF BREATH   ASPIRIN  EC 81 MG TABLET    Take 1 tablet (81 mg total) by mouth daily. Swallow whole.   BUDESON-GLYCOPYRROL-FORMOTEROL (BREZTRI  AEROSPHERE) 160-9-4.8 MCG/ACT AERO    Inhale 2 puffs into the lungs in the morning and at bedtime.   CONTINUOUS BLOOD GLUC SENSOR (FREESTYLE LIBRE 14 DAY SENSOR) MISC    USE TO TEST BLOOD SUGAR THREE TIMES DAILY. E11.51. E11.59   DAPAGLIFLOZIN  PROPANEDIOL (FARXIGA ) 10 MG TABS TABLET    Take 1 tablet (10 mg total) by mouth daily before breakfast. Generic   EVOLOCUMAB  (REPATHA  SURECLICK) 140 MG/ML SOAJ    Inject 140 mg into the skin every 14 (fourteen) days.   FLUTICASONE  (FLONASE ) 50 MCG/ACT NASAL SPRAY    Place 1 spray into both nostrils daily as needed for allergies or rhinitis.   GABAPENTIN  (NEURONTIN ) 300 MG CAPSULE    TAKE 3 CAPSULES (900 MG TOTAL) BY MOUTH AT BEDTIME. G25.81   GLUCOSE 4 GM  CHEWABLE TABLET    Chew 1 tablet (4 g total) by mouth as needed for low blood sugar.   INSULIN  ASPART (NOVOLOG  FLEXPEN) 100 UNIT/ML FLEXPEN    Inject 15-25 Units into the skin 3 (three) times daily with meals.   INSULIN  GLARGINE (BASAGLAR  KWIKPEN) 100 UNIT/ML    Inject 50 Units into the skin at bedtime.   INSULIN  PEN NEEDLE (BD PEN NEEDLE NANO 2ND GEN) 32G X 4 MM MISC  USE AS DIRECTED   LIFESCAN FINEPOINT LANCETS MISC    Use to test for blood sugar three times daily dx: E11.51   METOPROLOL  SUCCINATE (TOPROL -XL) 25 MG 24 HR TABLET    TAKE 1 TABLET BY MOUTH EVERY DAY WITH OR IMMEDIATELY FOLLOWING A MEAL   NITROGLYCERIN  (NITROSTAT ) 0.4 MG SL TABLET    PLACE 1 TABLET UNDER THE TONGUE EVERY 5 MINUTES AS NEEDED FOR CHEST PAIN.   ONETOUCH ULTRA TEST STRIP    USE TO TEST FOR BLOOD SUGAR THREE TIMES DAILY DX: E11.51   PANTOPRAZOLE  (PROTONIX ) 40 MG TABLET    TAKE ONE TABLET BY MOUTH ONCE DAILY FOR STOMACH   PRAMIPEXOLE  (MIRAPEX ) 0.5 MG TABLET    Take 0.25-0.5 mg by mouth as needed.   TIRZEPATIDE  (MOUNJARO ) 2.5 MG/0.5ML PEN    Inject 2.5 mg into the skin once a week.   VALSARTAN -HYDROCHLOROTHIAZIDE  (DIOVAN  HCT) 160-25 MG TABLET    Take 1 tablet by mouth daily.  Modified Medications   No medications on file  Discontinued Medications   No medications on file    Physical Exam:  Vitals:   09/29/23 0820  BP: 110/76  Pulse: 66  Resp: 11  Temp: 97.6 F (36.4 C)  SpO2: 97%  Weight: 206 lb 12.8 oz (93.8 kg)  Height: 5' 8 (1.727 m)   Body mass index is 31.44 kg/m. Wt Readings from Last 3 Encounters:  09/29/23 206 lb 12.8 oz (93.8 kg)  09/12/23 210 lb (95.3 kg)  09/02/23 212 lb (96.2 kg)    Physical Exam***  Labs reviewed: Basic Metabolic Panel: Recent Labs    12/23/22 2353 03/20/23 0809 06/09/23 1601  NA 133* 135 141  K 4.6 5.1 4.6  CL 98 97* 99  CO2 26 29 27   GLUCOSE 139* 201* 106*  BUN 39* 26* 21  CREATININE 1.62* 1.03 1.13  CALCIUM  9.5 9.9 9.5   Liver Function Tests: Recent  Labs    03/20/23 0809  AST 14  ALT 22  BILITOT 0.7  PROT 6.8   No results for input(s): LIPASE, AMYLASE in the last 8760 hours. No results for input(s): AMMONIA in the last 8760 hours. CBC: Recent Labs    12/24/22 0335 03/20/23 0809  WBC 9.1 10.0  NEUTROABS  --  8,910*  HGB 15.7 13.8  HCT 46.6 42.0  MCV 89.8 94.0  PLT 124* 162   Lipid Panel: Recent Labs    03/20/23 0809 06/09/23 1601  CHOL 186 102  HDL 48 43  LDLCALC 114* 38  TRIG 125 114  CHOLHDL 3.9 2.4   TSH: No results for input(s): TSH in the last 8760 hours. A1C: Lab Results  Component Value Date   HGBA1C 7.5 (A) 09/02/2023     Assessment/Plan *** There are no diagnoses linked to this encounter.   No follow-ups on file.: ***  Declyn Offield K. Kenneth BODILY [facility] [phone]

## 2023-09-30 ENCOUNTER — Ambulatory Visit: Payer: Self-pay | Admitting: Nurse Practitioner

## 2023-09-30 ENCOUNTER — Other Ambulatory Visit (HOSPITAL_COMMUNITY): Payer: Self-pay

## 2023-09-30 DIAGNOSIS — M5126 Other intervertebral disc displacement, lumbar region: Secondary | ICD-10-CM | POA: Diagnosis not present

## 2023-10-01 ENCOUNTER — Other Ambulatory Visit: Payer: Self-pay | Admitting: Nurse Practitioner

## 2023-10-01 DIAGNOSIS — E1165 Type 2 diabetes mellitus with hyperglycemia: Secondary | ICD-10-CM

## 2023-10-03 ENCOUNTER — Encounter: Payer: Self-pay | Admitting: Nurse Practitioner

## 2023-10-03 DIAGNOSIS — E1165 Type 2 diabetes mellitus with hyperglycemia: Secondary | ICD-10-CM | POA: Insufficient documentation

## 2023-10-03 DIAGNOSIS — R600 Localized edema: Secondary | ICD-10-CM | POA: Insufficient documentation

## 2023-10-03 NOTE — Assessment & Plan Note (Signed)
 Without worsening of symptoms, continues on protonix 

## 2023-10-03 NOTE — Assessment & Plan Note (Signed)
 Blood pressure well controlled, goal bp <140/90 Continue current medications and dietary modifications follow metabolic panel

## 2023-10-03 NOTE — Assessment & Plan Note (Signed)
-  encouraged to elevate legs above level of heart as tolerates, low sodium diet, compression hose as tolerates (on in am, off in pm)

## 2023-10-03 NOTE — Assessment & Plan Note (Signed)
 Not currently on supplements, will follow up

## 2023-10-03 NOTE — Assessment & Plan Note (Signed)
 Followed by endocrine, recently started on mounjaro  for better diabetic control.  Encouraged dietary compliance, routine foot care/monitoring and to keep up with diabetic eye exams through ophthalmology

## 2023-10-03 NOTE — Assessment & Plan Note (Signed)
 Stable at this time, followed by cardiology on asa, farxiga , valsartan -hydrochlorothiazide  and rapatha

## 2023-10-03 NOTE — Assessment & Plan Note (Signed)
 Continues on rapatha  Cardiology following lipids.

## 2023-10-03 NOTE — Assessment & Plan Note (Signed)
 Ongoing hoping treatment of OSA helps with symptom management.

## 2023-10-09 ENCOUNTER — Telehealth: Payer: Self-pay

## 2023-10-09 NOTE — Telephone Encounter (Signed)
 Sample  Medication:Mounjaro  Dose: 2.5 mg Quantity:1 box ONU:[id number] C EXP:04/27/25  The patient has been instructed regarding the correct time, dose, and frequency of taking this medication, including desired effects and most common side effects.   Takiyah Bohnsack S Tevis Conger 4:15 PM 10/09/2023

## 2023-10-10 DIAGNOSIS — L578 Other skin changes due to chronic exposure to nonionizing radiation: Secondary | ICD-10-CM | POA: Diagnosis not present

## 2023-10-10 DIAGNOSIS — C44311 Basal cell carcinoma of skin of nose: Secondary | ICD-10-CM | POA: Diagnosis not present

## 2023-10-10 NOTE — Telephone Encounter (Signed)
 Patient came in to office today and picked up 1 sample of Mounjaro .

## 2023-10-14 ENCOUNTER — Other Ambulatory Visit

## 2023-10-14 DIAGNOSIS — Z794 Long term (current) use of insulin: Secondary | ICD-10-CM

## 2023-10-14 DIAGNOSIS — E1165 Type 2 diabetes mellitus with hyperglycemia: Secondary | ICD-10-CM | POA: Diagnosis not present

## 2023-10-14 LAB — BASIC METABOLIC PANEL WITH GFR
BUN/Creatinine Ratio: 16 (calc) (ref 6–22)
BUN: 21 mg/dL (ref 7–25)
CO2: 28 mmol/L (ref 20–32)
Calcium: 9.2 mg/dL (ref 8.6–10.3)
Chloride: 104 mmol/L (ref 98–110)
Creat: 1.35 mg/dL — ABNORMAL HIGH (ref 0.70–1.28)
Glucose, Bld: 172 mg/dL — ABNORMAL HIGH (ref 65–99)
Potassium: 4.6 mmol/L (ref 3.5–5.3)
Sodium: 138 mmol/L (ref 135–146)
eGFR: 55 mL/min/1.73m2 — ABNORMAL LOW (ref >=60)

## 2023-10-15 ENCOUNTER — Ambulatory Visit: Payer: Self-pay | Admitting: Nurse Practitioner

## 2023-10-16 ENCOUNTER — Ambulatory Visit: Admitting: Internal Medicine

## 2023-10-16 ENCOUNTER — Other Ambulatory Visit: Payer: Self-pay | Admitting: Nurse Practitioner

## 2023-10-16 ENCOUNTER — Other Ambulatory Visit (HOSPITAL_COMMUNITY): Payer: Self-pay

## 2023-10-20 ENCOUNTER — Ambulatory Visit (INDEPENDENT_AMBULATORY_CARE_PROVIDER_SITE_OTHER): Admitting: Sleep Medicine

## 2023-10-20 ENCOUNTER — Encounter: Payer: Self-pay | Admitting: Sleep Medicine

## 2023-10-20 VITALS — BP 130/78 | HR 64 | Temp 98.1°F | Ht 68.0 in | Wt 205.8 lb

## 2023-10-20 DIAGNOSIS — G4733 Obstructive sleep apnea (adult) (pediatric): Secondary | ICD-10-CM | POA: Diagnosis not present

## 2023-10-20 DIAGNOSIS — Z6831 Body mass index (BMI) 31.0-31.9, adult: Secondary | ICD-10-CM

## 2023-10-20 DIAGNOSIS — G2581 Restless legs syndrome: Secondary | ICD-10-CM | POA: Diagnosis not present

## 2023-10-20 DIAGNOSIS — G4700 Insomnia, unspecified: Secondary | ICD-10-CM | POA: Diagnosis not present

## 2023-10-20 DIAGNOSIS — E669 Obesity, unspecified: Secondary | ICD-10-CM | POA: Diagnosis not present

## 2023-10-20 DIAGNOSIS — F5104 Psychophysiologic insomnia: Secondary | ICD-10-CM

## 2023-10-20 MED ORDER — METANX 3-90.314-2-35 MG PO CAPS: ORAL_CAPSULE | ORAL | 2 refills | Status: DC

## 2023-10-20 NOTE — Patient Instructions (Signed)
 SABRA

## 2023-10-20 NOTE — Progress Notes (Signed)
 Name:Kenneth Reifsteck Sr. MRN: [id number] DOB: 04/05/1950   CHIEF COMPLAINT:  ESTABLISH CARE FOR OSA   HISTORY OF PRESENT ILLNESS:  [name] is a 73 y.o. w/ a h/o OSA, HTN, CAD, DMII, TIA and obesity who presents for reassessment of OSA. Patient was diagnosed with severe OSA in 2024 but did not proceed with CPAP therapy. Reports c/o witnessed apnea, snoring and excessive daytime sleepiness. Reports nocturnal awakenings due to nocturia and often has difficulty falling back to sleep. Reports a 30 lb weight gain over the last year. Denies morning headaches, RLS symptoms, dream enactment, cataplexy, hypnagogic or hypnapompic hallucinations. Reports a family history of sleep apnea. Reports drowsy driving. Drinks 1-2 cups of coffee daily, denies alcohol, tobacco or illicit drug use. States that he often has difficulty falling asleep due to restless legs.  Bedtime 8:30-9 pm Sleep onset  30 mins Rise time 5 am   EPWORTH SLEEP SCORE 5    10/20/2023    8:00 AM 03/21/2022    9:00 AM  Results of the Epworth flowsheet  Sitting and reading 1 3  Watching TV 1 1  Sitting, inactive in a public place (e.g. a theatre or a meeting) 2 2  As a passenger in a car for an hour without a break 1 3  Lying down to rest in the afternoon when circumstances permit 0 0  Sitting and talking to someone 0 2  Sitting quietly after a lunch without alcohol 0 3  In a car, while stopped for a few minutes in traffic 0 1  Total score 5 15     PAST MEDICAL HISTORY :   has a past medical history of Arthritis, Barrett's esophagus, Bilateral swelling of feet and ankles (x 3 weeks as of 12-01-2019), CAD (coronary artery disease), native coronary artery, Cervicalgia, Chronic bronchitis (HCC), Complication of anesthesia, COVID-19 (05/2019), Dyspnea, Elevated LFTs (02/06/2021), Family history of adverse reaction to anesthesia, GERD (gastroesophageal reflux disease), Heart murmur, Hyperlipidemia, Hypertension, Hypoglycemia,  unspecified, IDDM (insulin  dependent diabetes mellitus), Impotence of organic origin, Iron  deficiency anemia, Memory loss, Other malaise and fatigue, Pneumonia (05/2019), PONV (postoperative nausea and vomiting), Restless leg, Rotator cuff arthropathy, Stroke (HCC), Tension headache, TIA (transient ischemic attack) (01/14/2021), Unspecified gastritis and gastroduodenitis with hemorrhage, and Unstable angina pectoris (HCC) (04/08/2014).  has a past surgical history that includes Knee surgery (Right, 1992;  2000); Incision and drainage abscess (Left, 05/2007); left heart catheterization with coronary angiogram (N/A, 12/15/2012); left heart catheterization with coronary angiogram (N/A, 03/26/2013); Fractional flow reserve wire (Right, 03/26/2013); left heart catheterization with coronary angiogram (N/A, 04/08/2014); Umbilical hernia repair (yrs ago); percutaneous coronary stent intervention (pci-s) (N/A, 05/03/2014); Fractional flow reserve wire (N/A, 05/03/2014); LEFT HEART CATH AND CORONARY ANGIOGRAPHY (N/A, 05/23/2017); Hernia repair (2010); Cardiac catheterization (N/A, 09/25/2015); Cardiac catheterization (N/A, 09/25/2015); Coronary angioplasty with stent (12/15/2012; 04/28/2014; 05/03/2014); Bicept tenodesis (Right, 12/07/2019); Rotator cuff repair (2022); Radiology with anesthesia (N/A, 02/08/2021); LEFT HEART CATH AND CORONARY ANGIOGRAPHY (N/A, 05/01/2021); CORONARY STENT INTERVENTION (N/A, 05/01/2021); Mouth surgery (Right); and Back surgery (Bilateral, 05/28/2023). Prior to Admission medications   Medication Sig Start Date End Date Taking? Authorizing Provider  acetaminophen  (TYLENOL ) 500 MG tablet Take 500 mg by mouth every 6 (six) hours as needed for mild pain, moderate pain, headache or fever.   Yes [provider]  albuterol  (VENTOLIN  HFA) 108 (90 Base) MCG/ACT inhaler TAKE 2 PUFFS BY MOUTH EVERY 6 HOURS AS NEEDED FOR WHEEZE OR SHORTNESS OF BREATH 11/26/22  Yes Caro Raisin  K, NP  aspirin   EC 81 MG tablet Take 1 tablet (81 mg total) by mouth daily. Swallow whole. 04/11/21  Yes Cantwell, Celeste C, PA-C  BD PEN NEEDLE NANO 2ND GEN 32G X 4 MM MISC USE AS DIRECTED 10/16/23  Yes Caro Harlene POUR, NP  Budeson-Glycopyrrol-Formoterol (BREZTRI  AEROSPHERE) 160-9-4.8 MCG/ACT AERO Inhale 2 puffs into the lungs in the morning and at bedtime. 11/07/21  Yes Kassie Acquanetta Bradley, MD  Continuous Blood Gluc Sensor (FREESTYLE LIBRE 14 DAY SENSOR) MISC USE TO TEST BLOOD SUGAR THREE TIMES DAILY. E11.51. E11.59 08/15/21  Yes Caro Harlene POUR, NP  dapagliflozin  propanediol (FARXIGA ) 10 MG TABS tablet Take 1 tablet (10 mg total) by mouth daily before breakfast. Generic 09/02/23  Yes Trixie File, MD  Evolocumab  (REPATHA  SURECLICK) 140 MG/ML SOAJ Inject 140 mg into the skin every 14 (fourteen) days. 09/17/23  Yes Ladona Heinz, MD  fluticasone  (FLONASE ) 50 MCG/ACT nasal spray Place 1 spray into both nostrils daily as needed for allergies or rhinitis. 10/11/21  Yes Caro Harlene POUR, NP  gabapentin  (NEURONTIN ) 300 MG capsule TAKE 3 CAPSULES (900 MG TOTAL) BY MOUTH AT BEDTIME. G25.81 09/25/23  Yes Eubanks, Jessica K, NP  glucose 4 GM chewable tablet Chew 1 tablet (4 g total) by mouth as needed for low blood sugar. 07/28/20  Yes Fargo, Amy E, NP  insulin  aspart (NOVOLOG  FLEXPEN) 100 UNIT/ML FlexPen Inject 15-25 Units into the skin 3 (three) times daily with meals. 06/27/23  Yes Trixie File, MD  [facility] FINEPOINT LANCETS MISC Use to test for blood sugar three times daily dx: E11.51 06/01/18  Yes Eubanks, Jessica K, NP  metoprolol  succinate (TOPROL -XL) 25 MG 24 hr tablet TAKE 1 TABLET BY MOUTH EVERY DAY WITH OR IMMEDIATELY FOLLOWING A MEAL 04/14/23  Yes Eubanks, Jessica K, NP  nitroGLYCERIN  (NITROSTAT ) 0.4 MG SL tablet PLACE 1 TABLET UNDER THE TONGUE EVERY 5 MINUTES AS NEEDED FOR CHEST PAIN. 01/29/23  Yes Ladona Heinz, MD  [facility] ULTRA test strip USE TO TEST FOR BLOOD SUGAR THREE TIMES DAILY DX: E11.51 12/04/18  Yes Reed,  Tiffany L, DO  pantoprazole  (PROTONIX ) 40 MG tablet TAKE ONE TABLET BY MOUTH ONCE DAILY FOR STOMACH 10/14/22  Yes Eubanks, Jessica K, NP  pramipexole  (MIRAPEX ) 0.5 MG tablet Take 0.25-0.5 mg by mouth as needed.   Yes [provider]  tirzepatide  (MOUNJARO ) 2.5 MG/0.5ML Pen Inject 2.5 mg into the skin once a week. 09/17/23  Yes Trixie File, MD  valsartan -hydrochlorothiazide  (DIOVAN  HCT) 160-25 MG tablet Take 1 tablet by mouth daily. 12/30/22  Yes Ladona Heinz, MD  Insulin  Glargine (BASAGLAR  KWIKPEN) 100 UNIT/ML Inject 50 Units into the skin at bedtime. Patient not taking: Reported on 10/20/2023    [provider]   Allergies  Allergen Reactions   Amlodipine  Other (See Comments)    Worsening restless legs, dyspnea and leg swelling   Lyrica  [Pregabalin ] Other (See Comments)    Made patient very lethargic the next morning, very hard to patient to function, move, etc.    Other Other (See Comments)    Convulsions, Causes Restless Legs, rhabdomyolysis   Statins Other (See Comments)    Causes Restless Legs, rhabdomyolysis   Ativan  [Lorazepam ] Other (See Comments)    Markedly increased restless legs   Trulicity  [Dulaglutide ] Other (See Comments)    GI side effects     FAMILY HISTORY:  family history includes Emphysema in his mother. He was adopted. SOCIAL HISTORY:  reports that he has never smoked. He has never used smokeless  tobacco. He reports that he does not drink alcohol and does not use drugs.   Review of Systems:  Gen:  Denies  fever, sweats, chills weight loss  HEENT: Denies blurred vision, double vision, ear pain, eye pain, hearing loss, nose bleeds, sore throat Cardiac:  No dizziness, chest pain or heaviness, chest tightness,edema, No JVD Resp:   No cough, -sputum production, -shortness of breath,-wheezing, -hemoptysis,  Gi: Denies swallowing difficulty, stomach pain, nausea or vomiting, diarrhea, constipation, bowel incontinence Gu:  Denies bladder  incontinence, burning urine Ext:   Denies Joint pain, stiffness or swelling Skin: Denies  skin rash, easy bruising or bleeding or hives Endoc:  Denies polyuria, polydipsia , polyphagia or weight change Psych:   Denies depression, insomnia or hallucinations  Other:  All other systems negative  VITAL SIGNS: BP 130/78 (BP Location: Right Arm, Patient Position: Sitting, Cuff Size: Normal)   Pulse 64   Temp 98.1 F (36.7 C) (Oral)   Ht 5' 8 (1.727 m)   Wt 205 lb 12.8 oz (93.4 kg)   SpO2 93%   BMI 31.29 kg/m    Physical Examination:   General Appearance: No distress  EYES PERRLA, EOM intact.   NECK Supple, No JVD Pulmonary: normal breath sounds, No wheezing.  CardiovascularNormal S1,S2.  No m/r/g.   Abdomen: Benign, Soft, non-tender. Skin:   warm, no rashes, no ecchymosis  Extremities: normal, no cyanosis, clubbing. Neuro:without focal findings,  speech normal  PSYCHIATRIC: Mood, affect within normal limits.   ASSESSMENT AND PLAN  OSA I suspect that OSA is the underlying cause of sleep maintenance insomnia. Will reassess OSA with HST. Discussed the consequences of untreated sleep apnea. Advised not to drive drowsy for safety of patient and others. Will reassess OSA with a home sleep study and follow up to review results.    RLS Will try patient on Metanx supplements. Also advised patient to try walking on the treadmill before and after dinner, to help decrease RLS symptoms.     Obesity Counseled patient on diet and lifestyle modification.  Insomnia Counseled patient on stimulus control and improving sleep hygiene practices.    MEDICATION ADJUSTMENTS/LABS AND TESTS ORDERED: Recommend Sleep Study   Patient  satisfied with Plan of action and management. All questions answered  Follow up to review HST results and treatment plan.   I spent a total of 62 minutes reviewing chart data, face-to-face evaluation with the patient, counseling and coordination of care as  detailed above.    Kortland Nichols, M.D.  Sleep Medicine [facility]

## 2023-10-25 ENCOUNTER — Other Ambulatory Visit: Payer: Self-pay | Admitting: Cardiology

## 2023-10-25 ENCOUNTER — Other Ambulatory Visit: Payer: Self-pay | Admitting: Nurse Practitioner

## 2023-10-25 DIAGNOSIS — I10 Essential (primary) hypertension: Secondary | ICD-10-CM

## 2023-10-25 DIAGNOSIS — I25118 Atherosclerotic heart disease of native coronary artery with other forms of angina pectoris: Secondary | ICD-10-CM

## 2023-10-29 ENCOUNTER — Ambulatory Visit: Payer: Medicare Other | Admitting: Cardiology

## 2023-10-31 ENCOUNTER — Ambulatory Visit: Payer: Medicare Other | Admitting: Nurse Practitioner

## 2023-10-31 ENCOUNTER — Encounter: Payer: Self-pay | Admitting: Nurse Practitioner

## 2023-10-31 VITALS — BP 138/68 | HR 67 | Temp 98.4°F | Ht 68.0 in | Wt 199.6 lb

## 2023-10-31 DIAGNOSIS — Z0000 Encounter for general adult medical examination without abnormal findings: Secondary | ICD-10-CM

## 2023-10-31 NOTE — Progress Notes (Signed)
 Subjective:   Kenneth Colee Sr. is a 73 y.o. male who presents for Medicare Annual/Subsequent preventive examination.  Visit Complete: In personPSC clinic   Cardiac Risk Factors include: advanced age (>10men, >75 women);male gender;dyslipidemia;hypertension;diabetes mellitus;obesity (BMI >30kg/m2)     Objective:    Today's Vitals   10/31/23 0806  BP: 138/68  Pulse: 67  Temp: 98.4 F (36.9 C)  SpO2: 97%  Weight: 199 lb 9.6 oz (90.5 kg)  Height: 5' 8 (1.727 m)   Body mass index is 30.35 kg/m.     08/01/2023   11:22 AM 07/18/2023    2:49 PM 04/28/2023    8:14 AM 03/17/2023    6:20 PM 03/17/2023    8:16 AM 01/29/2023    8:54 AM 12/23/2022   11:50 PM  Advanced Directives  Does Patient Have a Medical Advance Directive? No No No No No No No  Would patient like information on creating a medical advance directive?  Yes (MAU/Ambulatory/Procedural Areas - Information given) No - Patient declined  Yes (MAU/Ambulatory/Procedural Areas - Information given)  No - Patient declined    Current Medications (verified) Outpatient Encounter Medications as of 10/31/2023  Medication Sig   acetaminophen  (TYLENOL ) 500 MG tablet Take 500 mg by mouth every 6 (six) hours as needed for mild pain, moderate pain, headache or fever.   albuterol  (VENTOLIN  HFA) 108 (90 Base) MCG/ACT inhaler TAKE 2 PUFFS BY MOUTH EVERY 6 HOURS AS NEEDED FOR WHEEZE OR SHORTNESS OF BREATH   aspirin  EC 81 MG tablet Take 1 tablet (81 mg total) by mouth daily. Swallow whole.   BD PEN NEEDLE NANO 2ND GEN 32G X 4 MM MISC USE AS DIRECTED   Budeson-Glycopyrrol-Formoterol (BREZTRI  AEROSPHERE) 160-9-4.8 MCG/ACT AERO Inhale 2 puffs into the lungs in the morning and at bedtime.   Continuous Blood Gluc Sensor (FREESTYLE LIBRE 14 DAY SENSOR) MISC USE TO TEST BLOOD SUGAR THREE TIMES DAILY. E11.51. E11.59   dapagliflozin  propanediol (FARXIGA ) 10 MG TABS tablet Take 1 tablet (10 mg total) by mouth daily before breakfast. Generic   Evolocumab   (REPATHA  SURECLICK) 140 MG/ML SOAJ Inject 140 mg into the skin every 14 (fourteen) days.   fluticasone  (FLONASE ) 50 MCG/ACT nasal spray Place 1 spray into both nostrils daily as needed for allergies or rhinitis.   gabapentin  (NEURONTIN ) 300 MG capsule TAKE 3 CAPSULES (900 MG TOTAL) BY MOUTH AT BEDTIME. G25.81   glucose 4 GM chewable tablet Chew 1 tablet (4 g total) by mouth as needed for low blood sugar.   insulin  aspart (NOVOLOG  FLEXPEN) 100 UNIT/ML FlexPen Inject 15-25 Units into the skin 3 (three) times daily with meals.   Insulin  Glargine (BASAGLAR  KWIKPEN) 100 UNIT/ML Inject 50 Units into the skin at bedtime.   L-Methylfolate-Algae-B12-B6 (METANX) 3-90.314-2-35 MG CAPS Take 1 tablet PO daily   LIFESCAN FINEPOINT LANCETS MISC Use to test for blood sugar three times daily dx: E11.51   metoprolol  succinate (TOPROL -XL) 25 MG 24 hr tablet TAKE 1 TABLET BY MOUTH EVERY DAY WITH OR IMMEDIATELY FOLLOWING A MEAL   nitroGLYCERIN  (NITROSTAT ) 0.4 MG SL tablet PLACE 1 TABLET UNDER THE TONGUE EVERY 5 MINUTES AS NEEDED FOR CHEST PAIN.   ONETOUCH ULTRA test strip USE TO TEST FOR BLOOD SUGAR THREE TIMES DAILY DX: E11.51   pantoprazole  (PROTONIX ) 40 MG tablet TAKE ONE TABLET BY MOUTH ONCE DAILY FOR STOMACH   pramipexole  (MIRAPEX ) 0.5 MG tablet Take 0.25-0.5 mg by mouth as needed.   tirzepatide  (MOUNJARO ) 2.5 MG/0.5ML Pen Inject 2.5 mg into the  skin once a week.   valsartan -hydrochlorothiazide  (DIOVAN  HCT) 160-25 MG tablet Take 1 tablet by mouth daily.   Facility-Administered Encounter Medications as of 10/31/2023  Medication   [COMPLETED] hydrALAZINE  (APRESOLINE ) injection 10 mg    Allergies (verified) Amlodipine , Lyrica  [pregabalin ], Other, Statins, Ativan  [lorazepam ], and Trulicity  [dulaglutide ]   History: Past Medical History:  Diagnosis Date   Arthritis    left thumb (05/23/2017) right shoulder   Barrett's esophagus    we've been told that it's all gone; still take RX for GERD (05/23/2017)    Bilateral swelling of feet and ankles x 3 weeks as of 12-01-2019   CAD (coronary artery disease), native coronary artery    Coronary angiogram 05/03/2014:  Proximal LAD 3.0x12 mm Promus premier stent.  04/08/2014: Mid Cx 3.5 x 16 mm Promus DES.  03/29/2013: Mid LAD 2.75 x 38 mm Promus Premier drug-eluting stent, balloon angioplasty of D1 and distal LAD and stenting of distal RCA with 2.75 x 24 mm promos Premier drug-eluting stent 12/15/2012 widely patent.   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  04/08/2014: Mid Cx 3.5 x 16 mm Promus DES.  03/29/2013: Mid LAD 2.75 x 38 mm Promus Premier drug-eluting stent, balloon angioplasty of D1 and distal LAD and stenting of distal RCA with 2.75 x 24 mm promos Premier drug-eluting stent 12/15/2012 widely patent.   Past Surgical History:  Procedure Laterality Date    BACK SURGERY Bilateral 05/28/2023   BICEPT TENODESIS Right 12/07/2019   Procedure: RIGHT BICEPS TENODESIS;  Surgeon: Beverley Evalene BIRCH, MD;  Location: [facility];  Service: Orthopedics;  Laterality: Right;   CARDIAC CATHETERIZATION N/A 09/25/2015   Procedure: Left Heart Cath and Coronary Angiography;  Surgeon: Gordy Bergamo, MD;  Location: [facility] INVASIVE CV LAB;  Service: Cardiovascular;  Laterality: N/A;   CARDIAC CATHETERIZATION N/A 09/25/2015   Procedure: Intravascular Pressure Wire/FFR Study;  Surgeon: Gordy Bergamo, MD;  Location: [facility] INVASIVE CV LAB;  Service: Cardiovascular;  Laterality: N/A;   CORONARY ANGIOPLASTY WITH STENT PLACEMENT  12/15/2012; 04/28/2014; 05/03/2014   2; 1; 1 , 4 stents 2 balloons   CORONARY STENT INTERVENTION N/A 05/01/2021  Procedure: CORONARY STENT INTERVENTION;  Surgeon: Ladona Heinz, MD;  Location: [facility] INVASIVE CV LAB;  Service: Cardiovascular;  Laterality: N/A;   FRACTIONAL FLOW RESERVE WIRE Right 03/26/2013   Procedure: FRACTIONAL FLOW RESERVE WIRE;  Surgeon: Erick JONELLE Ladona, MD;  Location: [facility] CATH LAB;  Service: Cardiovascular;  Laterality: Right;   FRACTIONAL FLOW RESERVE WIRE N/A 05/03/2014   Procedure: FRACTIONAL FLOW RESERVE WIRE;  Surgeon: Erick JONELLE Ladona, MD;  Location: [facility] CATH LAB;  Service: Cardiovascular;  Laterality: N/A;   HERNIA REPAIR  2010   umbilical    INCISION AND DRAINAGE ABSCESS Left 05/2007   groin    KNEE SURGERY Right 1992;  2000   calcium  deposits removed (12/15/2012)   LEFT HEART CATH AND CORONARY ANGIOGRAPHY N/A 05/23/2017   Procedure: LEFT HEART CATH AND CORONARY ANGIOGRAPHY;  Surgeon: Ladona Heinz, MD;  Location: [facility] INVASIVE CV LAB;  Service: Cardiovascular;  Laterality: N/A;   LEFT HEART CATH AND CORONARY ANGIOGRAPHY N/A 05/01/2021   Procedure: LEFT HEART CATH AND CORONARY ANGIOGRAPHY;  Surgeon: Ladona Heinz, MD;  Location: [facility] INVASIVE CV LAB;  Service: Cardiovascular;  Laterality: N/A;   LEFT HEART CATHETERIZATION WITH  CORONARY ANGIOGRAM N/A 12/15/2012   Procedure: LEFT HEART CATHETERIZATION WITH CORONARY ANGIOGRAM;  Surgeon: Erick JONELLE Ladona, MD;  Location: [facility] CATH LAB;  Service: Cardiovascular;  Laterality: N/A;   LEFT HEART CATHETERIZATION WITH CORONARY ANGIOGRAM N/A 03/26/2013   Procedure: LEFT HEART CATHETERIZATION WITH CORONARY ANGIOGRAM;  Surgeon: Erick JONELLE Ladona, MD;  Location: [facility] CATH LAB;  Service: Cardiovascular;  Laterality: N/A;   LEFT HEART CATHETERIZATION WITH CORONARY ANGIOGRAM N/A 04/08/2014   Procedure: LEFT HEART CATHETERIZATION WITH CORONARY ANGIOGRAM;  Surgeon: Ozell JONETTA Fell, MD;  Location: [facility] CATH LAB;  Service: Cardiovascular;  Laterality: N/A;   MOUTH SURGERY Right    PERCUTANEOUS CORONARY STENT INTERVENTION (PCI-S) N/A 05/03/2014   Procedure: PERCUTANEOUS CORONARY STENT INTERVENTION (PCI-S);  Surgeon: Erick JONELLE Ladona, MD;  Location: [facility] CATH LAB;  Service: Cardiovascular;  Laterality: N/A;   RADIOLOGY WITH ANESTHESIA N/A 02/08/2021   Procedure: MRI WITH ANESTHESIA OF ABDOMEN WITH AND WITHOUT CONSTRAST;  Surgeon: Radiologist, Medication, MD;  Location: [facility] OR;  Service: Radiology;  Laterality: N/A;   ROTATOR CUFF REPAIR  2022   UMBILICAL HERNIA REPAIR  yrs ago   Family History  Adopted: Yes  Problem Relation Age of Onset   Emphysema Mother    Social History   Socioeconomic History   Marital status: Married    Spouse name: Pam   Number of children: 2   Years of education: 12   Highest education level: High school graduate  Occupational History   Occupation: Naval architect    Comment: deliveries only now   Occupation: woodworking  Tobacco Use   Smoking status: Never   Smokeless tobacco: Never  Vaping Use   Vaping status: Never Used  Substance and Sexual Activity   Alcohol use: No   Drug use: No   Sexual activity: Not Currently  Other Topics Concern   Not on file  Social History Narrative   Right handed    Caffeine 2 cups daily   Winter time 4 cups daily    Lives family one level home    Semi Retired   Social Drivers of Corporate investment banker Strain: Low Risk  (01/18/2021)   Overall Financial Resource Strain (CARDIA)    Difficulty of Paying Living Expenses: Not very hard  Food Insecurity: No Food Insecurity (01/18/2021)   Hunger Vital Sign  Worried About Programme researcher, broadcasting/film/video in the Last Year: Never true    Ran Out of Food in the Last Year: Never true  Transportation Needs: No Transportation Needs (01/18/2021)   PRAPARE - Administrator, Civil Service (Medical): No    Lack of Transportation (Non-Medical): No  Physical Activity: Inactive (04/25/2017)   Exercise Vital Sign    Days of Exercise per Week: 0 days    Minutes of Exercise per Session: 0 min  Stress: No Stress Concern Present (04/25/2017)   [employer] of Occupational Health - Occupational Stress Questionnaire    Feeling of Stress : Only a little  Social Connections: Moderately Integrated (04/25/2017)   Social Connection and Isolation Panel    Frequency of Communication with Friends and Family: More than three times a week    Frequency of Social Gatherings with Friends and Family: More than three times a week    Attends Religious Services: More than 4 times per year    Active Member of [employer] or Organizations: No    Attends Engineer, structural: Never    Marital Status: Married    Tobacco Counseling Counseling given: Not Answered   Clinical Intake:  Pre-visit preparation completed: Yes  Pain : No/denies pain     BMI - recorded: 30.5 Nutritional Status: BMI > 30  Obese Nutritional Risks: None Diabetes: Yes  How often do you need to have someone help you when you read instructions, pamphlets, or other written materials from your doctor or pharmacy?: 1 - Never         Activities of Daily Living    10/31/2023    8:30 AM  In your present state of health, do you have any difficulty performing the following activities:  Hearing? 1   Vision? 0  Difficulty concentrating or making decisions? 1  Walking or climbing stairs? 1  Dressing or bathing? 0  Doing errands, shopping? 0  Preparing Food and eating ? N  Using the Toilet? N  In the past six months, have you accidently leaked urine? Y  Do you have problems with loss of bowel control? N  Managing your Medications? N  Managing your Finances? N  Housekeeping or managing your Housekeeping? N    Patient Care Team: Caro Harlene POUR, NP as PCP - General (Geriatric Medicine) Ladona Heinz, MD as PCP - Cardiology (Cardiology) Georjean Darice HERO, MD as Consulting Physician (Neurology) [name], [name], OD (Optometry) Neda Jennet LABOR, MD as Consulting Physician (Pulmonary Disease) Trixie File, MD as Consulting Physician (Internal Medicine)  Indicate any recent Medical Services you may have received from other than [facility] providers in the past year (date may be approximate).     Assessment:   This is a routine wellness examination for Kreig.  Hearing/Vision screen Vision Screening - Comments:: DR. [name], [name]   Goals Addressed   None    Depression Screen    10/31/2023    8:22 AM 07/18/2023    2:49 PM 04/28/2023    8:14 AM 10/28/2022    9:08 AM 09/12/2022    9:06 AM 07/22/2022    8:26 AM 10/11/2021    8:13 AM  PHQ 2/9 Scores  PHQ - 2 Score 0 0 0 0 0 0 0    Fall Risk    10/31/2023    8:21 AM 08/01/2023   11:21 AM 07/18/2023    2:49 PM 04/28/2023    8:14 AM 03/17/2023    8:15 AM  Fall Risk  Falls in the past year? 0 1 0 0 1  Number falls in past yr: 0 0  0 1  Injury with Fall? 0 1  0 1  Comment     back problems  Risk for fall due to : No Fall Risks   No Fall Risks History of fall(s)  Follow up Falls evaluation completed Falls evaluation completed   Falls evaluation completed    MEDICARE RISK AT HOME: Medicare Risk at Home Any stairs in or around the home?: Yes If so, are there any without handrails?: Yes Home free of loose throw rugs in  walkways, pet beds, electrical cords, etc?: Yes Adequate lighting in your home to reduce risk of falls?: Yes Life alert?: No Use of a cane, walker or w/c?: No Grab bars in the bathroom?: No Shower chair or bench in shower?: No Elevated toilet seat or a handicapped toilet?: No  TIMED UP AND GO:  Was the test performed?  No    Cognitive Function:    04/25/2017    9:17 AM 01/10/2016    9:05 AM  MMSE - Mini Mental State Exam  Orientation to time 5  5   Orientation to Place 5  5   Registration 3  3   Attention/ Calculation 2  5   Recall 2  3   Language- name 2 objects 2  2   Language- repeat 1 1  Language- follow 3 step command 3  3   Language- read & follow direction 1  1   Write a sentence 1  1   Copy design 1  1   Total score 26  30      Data saved with a previous flowsheet row definition        10/31/2023    8:25 AM 10/28/2022    9:10 AM 10/11/2021    8:46 AM 03/02/2020    9:32 AM 03/02/2019    9:52 AM  6CIT Screen  What Year? 0 points 0 points 0 points 0 points 0 points  What month? 0 points 0 points 0 points 0 points 0 points  What time? 0 points 0 points 0 points 0 points 0 points  Count back from 20 0 points 0 points 0 points 0 points 0 points  Months in reverse 0 points 0 points 0 points 2 points 2 points  Repeat phrase 0 points 2 points 4 points 4 points 0 points  Total Score 0 points 2 points 4 points 6 points 2 points    Immunizations Immunization History  Administered Date(s) Administered   Pneumococcal Conjugate-13 12/01/2013   Td 03/04/1998, 03/01/2017   Tdap 03/06/2011, 02/28/2017    TDAP status: Up to date  Flu Vaccine status: Declined, Education has been provided regarding the importance of this vaccine but patient still declined. Advised may receive this vaccine at local pharmacy or Health Dept. Aware to provide a copy of the vaccination record if obtained from local pharmacy or Health Dept. Verbalized acceptance and understanding.  Pneumococcal  vaccine status: Declined,  Education has been provided regarding the importance of this vaccine but patient still declined. Advised may receive this vaccine at local pharmacy or Health Dept. Aware to provide a copy of the vaccination record if obtained from local pharmacy or Health Dept. Verbalized acceptance and understanding.   Covid-19 vaccine status: Declined, Education has been provided regarding the importance of this vaccine but patient still declined. Advised may receive this vaccine at local pharmacy or Health Dept.or vaccine  clinic. Aware to provide a copy of the vaccination record if obtained from local pharmacy or Health Dept. Verbalized acceptance and understanding.  Qualifies for Shingles Vaccine? Yes   Zostavax completed No   Shingrix Completed?: No.    Education has been provided regarding the importance of this vaccine. Patient has been advised to call insurance company to determine out of pocket expense if they have not yet received this vaccine. Advised may also receive vaccine at local pharmacy or Health Dept. Verbalized acceptance and understanding.  Screening Tests Health Maintenance  Topic Date Due   OPHTHALMOLOGY EXAM  11/13/2023   FOOT EXAM  12/06/2023   HEMOGLOBIN A1C  03/04/2024   Diabetic kidney evaluation - Urine ACR  05/19/2024   Diabetic kidney evaluation - eGFR measurement  10/13/2024   Medicare Annual Wellness (AWV)  10/30/2024   Colonoscopy  10/18/2025   DTaP/Tdap/Td (5 - Td or Tdap) 03/02/2027   Hepatitis C Screening  Completed   HPV VACCINES  Aged Out   Meningococcal B Vaccine  Aged Out   Pneumococcal Vaccine: 50+ Years  Discontinued   INFLUENZA VACCINE  Discontinued   COVID-19 Vaccine  Discontinued   Zoster Vaccines- Shingrix  Discontinued    Health Maintenance  There are no preventive care reminders to display for this patient.   Colorectal cancer screening: Type of screening: Colonoscopy. Completed 2017. Repeat every 10 years  Lung Cancer  Screening: (Low Dose CT Chest recommended if Age 58-80 years, 20 pack-year currently smoking OR have quit w/in 15years.) does not qualify.   Lung Cancer Screening Referral: na  Additional Screening:  Hepatitis C Screening: does qualify; Completed   Vision Screening: Recommended annual ophthalmology exams for early detection of glaucoma and other disorders of the eye. Is the patient up to date with their annual eye exam?  Yes  Who is the provider or what is the name of the office in which the patient attends annual eye exams? [name] If pt is not established with a provider, would they like to be referred to a provider to establish care? No .   Dental Screening: Recommended annual dental exams for proper oral hygiene  Diabetic Foot Exam: Diabetic Foot Exam: Completed 12/06/2023  Community Resource Referral / Chronic Care Management: CRR required this visit?  No   CCM required this visit?  No     Plan:     I have personally reviewed and noted the following in the patient's chart:   Medical and social history Use of alcohol, tobacco or illicit drugs  Current medications and supplements including opioid prescriptions. Patient is not currently taking opioid prescriptions. Functional ability and status Nutritional status Physical activity Advanced directives List of other physicians Hospitalizations, surgeries, and ER visits in previous 12 months Vitals Screenings to include cognitive, depression, and falls Referrals and appointments  In addition, I have reviewed and discussed with patient certain preventive protocols, quality metrics, and best practice recommendations. A written personalized care plan for preventive services as well as general preventive health recommendations were provided to patient.     Harlene MARLA An, NP   10/31/2023

## 2023-10-31 NOTE — Patient Instructions (Signed)
  [name] , Thank you for taking time to come for your Medicare Wellness Visit. I appreciate your ongoing commitment to your health goals. Please review the following plan we discussed and let me know if I can assist you in the future.     This is a list of the screening recommended for you and due dates:  Health Maintenance  Topic Date Due   Eye exam for diabetics  11/13/2023   Complete foot exam   12/06/2023   Hemoglobin A1C  03/04/2024   Yearly kidney health urinalysis for diabetes  05/19/2024   Yearly kidney function blood test for diabetes  10/13/2024   Medicare Annual Wellness Visit  10/30/2024   Colon Cancer Screening  10/18/2025   DTaP/Tdap/Td vaccine (5 - Td or Tdap) 03/02/2027   Hepatitis C Screening  Completed   HPV Vaccine  Aged Out   Meningitis B Vaccine  Aged Out   Pneumococcal Vaccine for age over 64  Discontinued   Flu Shot  Discontinued   COVID-19 Vaccine  Discontinued   Zoster (Shingles) Vaccine  Discontinued

## 2023-11-05 DIAGNOSIS — C44311 Basal cell carcinoma of skin of nose: Secondary | ICD-10-CM | POA: Diagnosis not present

## 2023-11-06 ENCOUNTER — Other Ambulatory Visit: Payer: Self-pay

## 2023-11-06 DIAGNOSIS — G2581 Restless legs syndrome: Secondary | ICD-10-CM

## 2023-11-06 DIAGNOSIS — F5104 Psychophysiologic insomnia: Secondary | ICD-10-CM

## 2023-11-06 MED ORDER — METANX 3-90.314-2-35 MG PO CAPS: ORAL_CAPSULE | ORAL | 2 refills | Status: AC

## 2023-11-06 NOTE — Telephone Encounter (Signed)
 Copied from CRM [id number]. Topic: Clinical - Prescription Issue >> Nov 06, 2023  9:25 AM Isabell A wrote: Reason for CRM: [facility] L-Methylfolate-Algae-B12-B6 [facility]) 3-90.314-2-35 MG CAPS.  [id number] [place], [place] - [address] [place] [address] Phone: [phone]  Fax: [phone]  Requesting a call back [phone]

## 2023-11-06 NOTE — Telephone Encounter (Signed)
 Approved.

## 2023-11-11 ENCOUNTER — Encounter

## 2023-11-11 DIAGNOSIS — G4733 Obstructive sleep apnea (adult) (pediatric): Secondary | ICD-10-CM

## 2023-11-21 ENCOUNTER — Other Ambulatory Visit: Payer: Self-pay | Admitting: Internal Medicine

## 2023-11-24 DIAGNOSIS — G4733 Obstructive sleep apnea (adult) (pediatric): Secondary | ICD-10-CM | POA: Diagnosis not present

## 2023-11-25 ENCOUNTER — Ambulatory Visit: Payer: Self-pay

## 2023-11-25 DIAGNOSIS — G4733 Obstructive sleep apnea (adult) (pediatric): Secondary | ICD-10-CM

## 2023-12-03 ENCOUNTER — Telehealth: Payer: Self-pay | Admitting: Internal Medicine

## 2023-12-03 ENCOUNTER — Encounter: Payer: Self-pay | Admitting: Internal Medicine

## 2023-12-03 ENCOUNTER — Ambulatory Visit: Admitting: Internal Medicine

## 2023-12-03 VITALS — BP 104/80 | HR 64 | Ht 68.0 in | Wt 200.0 lb

## 2023-12-03 DIAGNOSIS — E785 Hyperlipidemia, unspecified: Secondary | ICD-10-CM

## 2023-12-03 DIAGNOSIS — Z7985 Long-term (current) use of injectable non-insulin antidiabetic drugs: Secondary | ICD-10-CM

## 2023-12-03 DIAGNOSIS — Z7984 Long term (current) use of oral hypoglycemic drugs: Secondary | ICD-10-CM | POA: Diagnosis not present

## 2023-12-03 DIAGNOSIS — E1165 Type 2 diabetes mellitus with hyperglycemia: Secondary | ICD-10-CM | POA: Diagnosis not present

## 2023-12-03 DIAGNOSIS — Z794 Long term (current) use of insulin: Secondary | ICD-10-CM

## 2023-12-03 DIAGNOSIS — E1159 Type 2 diabetes mellitus with other circulatory complications: Secondary | ICD-10-CM | POA: Diagnosis not present

## 2023-12-03 LAB — POCT GLYCOSYLATED HEMOGLOBIN (HGB A1C): Hemoglobin A1C: 6.9 % — AB (ref 4.0–5.6)

## 2023-12-03 NOTE — Telephone Encounter (Signed)
 error

## 2023-12-03 NOTE — Progress Notes (Signed)
 Patient ID: Kenneth Cantave Sr., male   DOB: 1950-03-22, 73 y.o.   MRN: [id number]  HPI: Kenneth Bernards Sr. is a 73 y.o.-year-old male, returning for follow-up for DM2, dx in 2016, insulin -dependent  since 2020, uncontrolled, with complications (CAD, history of CVA, CKD stage III). Pt. previously saw Dr. Kassie, but last visit with me 3 months ago.   He is here with his wife who offers part of the history especially regarding his activity and past medical history.  Interim history: No increased urination, nausea, chest pain.  He has leg cramps ans RLS.  Reviewed HbA1c: Lab Results  Component Value Date   HGBA1C 7.5 (A) 09/02/2023   HGBA1C 7.5 (A) 05/20/2023   HGBA1C 9.5 (A) 04/03/2023   HGBA1C 8.4 (A) 12/06/2022   HGBA1C 9.1 (H) 07/22/2022   HGBA1C 7.8 (A) 02/28/2022   HGBA1C 8.1 (H) 10/05/2021   HGBA1C 8.3 (A) 05/07/2021   HGBA1C 7.9 (H) 01/15/2021   HGBA1C 8.1 (A) 12/25/2020   Previously on: - Basaglar  35 >> 30 units at bedtime - Novolog : mostly after the meal) 10-20 units before B 10-15 units before L 15-20 unit before D He stopped Ozempic  0.5 mg weekly >> nausea, could not function. He had nausea with metformin . He also had nausea with Trulicity .  Currently on: - Basaglar  36 >> 40 >> 30 units daily -but he actually carries this between 30 and 50 units daily - Fiasp  >> Novolog  10-20 >> 10-30 units before meals (ave 20 units) - Fiasp  >> NovoLog  sliding scale:  150-175: +4 176-200: +6 201-250: +10 251-300: +14 >300: +18 - Farxiga  10 mg before b'fast - started by Dr. Ganji 06/2023 - 300$ for 30 days >> off >> restarted - Mounjaro  2.5 mg weekly >> initially nausea, food aversion >> restarted, now tolerated well  Pt checks his sugars >4 a day with his CGM:  Prev.:   Previously:   Lowest sugar was 46 >> ...  64 >> 55; he has hypoglycemia awareness at 70.  Highest sugar was 400 >> HI >> 300s  Glucometer: One Touch ultra  - + CKD, last BUN/creatinine:  Lab Results   Component Value Date   BUN 21 10/14/2023   BUN 30 (H) 09/29/2023   CREATININE 1.35 (H) 10/14/2023   CREATININE 1.50 (H) 09/29/2023   Lab Results  Component Value Date   MICRALBCREAT 9 05/20/2023   MICRALBCREAT 12 07/22/2022   MICRALBCREAT 20 03/06/2020   MICRALBCREAT SEE NOTE 08/27/2016   MICRALBCREAT <10.2 08/26/2013  On Diovan  80 mg daily.  -+HL; last set of lipids: Lab Results  Component Value Date   CHOL 102 06/09/2023   HDL 43 06/09/2023   LDLCALC 38 06/09/2023   LDLDIRECT 110 (H) 06/17/2019   TRIG 114 06/09/2023   CHOLHDL 2.4 06/09/2023  He is on Praluent .  He had side effects from statins in the past.  - last eye exam was 2025. No DR reportedly.   - +  restless leg syndrome.  He is on Neurontin . Also, on pramipexole  for restless leg syndrome.  He sees neurology.  Last foot exam 12/06/2022.  He also has a history of HTN, GERD, Barrett's esophagus, iron  deficiency anemia, memory loss.  ROS: + see HPI  Past Medical History:  Diagnosis Date   Arthritis    left thumb (05/23/2017) right shoulder   Barrett's esophagus    we've been told that it's all gone; still take RX for GERD (05/23/2017)   Bilateral swelling of feet and ankles x 3  weeks as of 12-01-2019   CAD (coronary artery disease), native coronary artery    Coronary angiogram 05/03/2014:  Proximal LAD 3.0x12 mm Promus premier stent.  04/08/2014: Mid Cx 3.5 x 16 mm Promus DES.  03/29/2013: Mid LAD 2.75 x 38 mm Promus Premier drug-eluting stent, balloon angioplasty of D1 and distal LAD and stenting of distal RCA with 2.75 x 24 mm promos Premier drug-eluting stent 12/15/2012 widely patent.   Cervicalgia    Chronic bronchitis (HCC)    q yr (05/23/2017)   Complication of anesthesia     I do not wake up very well   slow to wake up   COVID-19 05/2019   all covid symptoms in hospital x 5 days, all symptoms resolved took monoclonal antibody tx    Dyspnea    with exertion   Elevated LFTs 02/06/2021   Family history  of adverse reaction to anesthesia    son w/PONV   GERD (gastroesophageal reflux disease)    Heart murmur    grew out of it    Hyperlipidemia    Hypertension    Hypoglycemia, unspecified    IDDM (insulin  dependent diabetes mellitus)    type 2   Impotence of organic origin    Iron  deficiency anemia    Memory loss    mild   Other malaise and fatigue    Pneumonia 05/2019   PONV (postoperative nausea and vomiting)    after wisdom teeth pulled, no problems with other surgeries   Restless leg    Rotator cuff arthropathy    Stroke (HCC)    Tension headache    sometimes (05/23/2017)   TIA (transient ischemic attack) 01/14/2021   Unspecified gastritis and gastroduodenitis with hemorrhage    Unstable angina pectoris (HCC) 04/08/2014   Coronary angiogram 05/03/2014:  Proximal LAD 3.0x12 mm Promus premier stent.  04/08/2014: Mid Cx 3.5 x 16 mm Promus DES.  03/29/2013: Mid LAD 2.75 x 38 mm Promus Premier drug-eluting stent, balloon angioplasty of D1 and distal LAD and stenting of distal RCA with 2.75 x 24 mm promos Premier drug-eluting stent 12/15/2012 widely patent.   Past Surgical History:  Procedure Laterality Date   BACK SURGERY Bilateral 05/28/2023   BICEPT TENODESIS Right 12/07/2019   Procedure: RIGHT BICEPS TENODESIS;  Surgeon: Beverley Evalene BIRCH, MD;  Location: [facility];  Service: Orthopedics;  Laterality: Right;   CARDIAC CATHETERIZATION N/A 09/25/2015   Procedure: Left Heart Cath and Coronary Angiography;  Surgeon: Gordy Bergamo, MD;  Location: [facility] INVASIVE CV LAB;  Service: Cardiovascular;  Laterality: N/A;   CARDIAC CATHETERIZATION N/A 09/25/2015   Procedure: Intravascular Pressure Wire/FFR Study;  Surgeon: Gordy Bergamo, MD;  Location: [facility] INVASIVE CV LAB;  Service: Cardiovascular;  Laterality: N/A;   CORONARY ANGIOPLASTY WITH STENT PLACEMENT  12/15/2012; 04/28/2014; 05/03/2014   2; 1; 1 , 4 stents 2 balloons   CORONARY STENT INTERVENTION N/A 05/01/2021   Procedure:  CORONARY STENT INTERVENTION;  Surgeon: Bergamo Gordy, MD;  Location: [facility] INVASIVE CV LAB;  Service: Cardiovascular;  Laterality: N/A;   FRACTIONAL FLOW RESERVE WIRE Right 03/26/2013   Procedure: FRACTIONAL FLOW RESERVE WIRE;  Surgeon: Erick JONELLE Bergamo, MD;  Location: [facility] CATH LAB;  Service: Cardiovascular;  Laterality: Right;   FRACTIONAL FLOW RESERVE WIRE N/A 05/03/2014   Procedure: FRACTIONAL FLOW RESERVE WIRE;  Surgeon: Erick JONELLE Bergamo, MD;  Location: [facility] CATH LAB;  Service: Cardiovascular;  Laterality: N/A;   HERNIA REPAIR  2010   umbilical    INCISION AND DRAINAGE  ABSCESS Left 05/2007   groin    KNEE SURGERY Right 1992;  2000   calcium  deposits removed (12/15/2012)   LEFT HEART CATH AND CORONARY ANGIOGRAPHY N/A 05/23/2017   Procedure: LEFT HEART CATH AND CORONARY ANGIOGRAPHY;  Surgeon: Ladona Heinz, MD;  Location: [facility] INVASIVE CV LAB;  Service: Cardiovascular;  Laterality: N/A;   LEFT HEART CATH AND CORONARY ANGIOGRAPHY N/A 05/01/2021   Procedure: LEFT HEART CATH AND CORONARY ANGIOGRAPHY;  Surgeon: Ladona Heinz, MD;  Location: [facility] INVASIVE CV LAB;  Service: Cardiovascular;  Laterality: N/A;   LEFT HEART CATHETERIZATION WITH CORONARY ANGIOGRAM N/A 12/15/2012   Procedure: LEFT HEART CATHETERIZATION WITH CORONARY ANGIOGRAM;  Surgeon: Erick JONELLE Ladona, MD;  Location: [facility] CATH LAB;  Service: Cardiovascular;  Laterality: N/A;   LEFT HEART CATHETERIZATION WITH CORONARY ANGIOGRAM N/A 03/26/2013   Procedure: LEFT HEART CATHETERIZATION WITH CORONARY ANGIOGRAM;  Surgeon: Erick JONELLE Ladona, MD;  Location: [facility] CATH LAB;  Service: Cardiovascular;  Laterality: N/A;   LEFT HEART CATHETERIZATION WITH CORONARY ANGIOGRAM N/A 04/08/2014   Procedure: LEFT HEART CATHETERIZATION WITH CORONARY ANGIOGRAM;  Surgeon: Ozell JONETTA Fell, MD;  Location: [facility] CATH LAB;  Service: Cardiovascular;  Laterality: N/A;   MOUTH SURGERY Right    PERCUTANEOUS CORONARY STENT INTERVENTION (PCI-S) N/A 05/03/2014   Procedure: PERCUTANEOUS  CORONARY STENT INTERVENTION (PCI-S);  Surgeon: Erick JONELLE Ladona, MD;  Location: [facility] CATH LAB;  Service: Cardiovascular;  Laterality: N/A;   RADIOLOGY WITH ANESTHESIA N/A 02/08/2021   Procedure: MRI WITH ANESTHESIA OF ABDOMEN WITH AND WITHOUT CONSTRAST;  Surgeon: Radiologist, Medication, MD;  Location: [facility] OR;  Service: Radiology;  Laterality: N/A;   ROTATOR CUFF REPAIR  2022   UMBILICAL HERNIA REPAIR  yrs ago   Social History   Socioeconomic History   Marital status: Married    Spouse name: Pam   Number of children: 2   Years of education: 12   Highest education level: High school graduate  Occupational History   Occupation: Naval architect    Comment: deliveries only now   Occupation: woodworking  Tobacco Use   Smoking status: Never   Smokeless tobacco: Never  Vaping Use   Vaping status: Never Used  Substance and Sexual Activity   Alcohol use: No   Drug use: No   Sexual activity: Not Currently  Other Topics Concern   Not on file  Social History Narrative   Right handed    Caffeine 2 cups daily   Winter time 4 cups daily   Lives family one level home    Semi Retired   Social Drivers of Corporate investment banker Strain: Low Risk  (01/18/2021)   Overall Financial Resource Strain (CARDIA)    Difficulty of Paying Living Expenses: Not very hard  Food Insecurity: No Food Insecurity (01/18/2021)   Hunger Vital Sign    Worried About Running Out of Food in the Last Year: Never true    Ran Out of Food in the Last Year: Never true  Transportation Needs: No Transportation Needs (01/18/2021)   PRAPARE - Administrator, Civil Service (Medical): No    Lack of Transportation (Non-Medical): No  Physical Activity: Inactive (04/25/2017)   Exercise Vital Sign    Days of Exercise per Week: 0 days    Minutes of Exercise per Session: 0 min  Stress: No Stress Concern Present (04/25/2017)   [employer] of Occupational Health - Occupational Stress Questionnaire    Feeling of  Stress : Only a little  Social Connections: Moderately Integrated (  04/25/2017)   Social Connection and Isolation Panel    Frequency of Communication with Friends and Family: More than three times a week    Frequency of Social Gatherings with Friends and Family: More than three times a week    Attends Religious Services: More than 4 times per year    Active Member of [employer] or Organizations: No    Attends Banker Meetings: Never    Marital Status: Married  Catering manager Violence: Not At Risk (04/25/2017)   Humiliation, Afraid, Rape, and Kick questionnaire    Fear of Current or Ex-Partner: No    Emotionally Abused: No    Physically Abused: No    Sexually Abused: No   Current Outpatient Medications on File Prior to Visit  Medication Sig Dispense Refill   acetaminophen  (TYLENOL ) 500 MG tablet Take 500 mg by mouth every 6 (six) hours as needed for mild pain, moderate pain, headache or fever.     albuterol  (VENTOLIN  HFA) 108 (90 Base) MCG/ACT inhaler TAKE 2 PUFFS BY MOUTH EVERY 6 HOURS AS NEEDED FOR WHEEZE OR SHORTNESS OF BREATH 6.7 each 2   aspirin  EC 81 MG tablet Take 1 tablet (81 mg total) by mouth daily. Swallow whole. 90 tablet 3   BD PEN NEEDLE NANO 2ND GEN 32G X 4 MM MISC USE AS DIRECTED 100 each 3   Budeson-Glycopyrrol-Formoterol (BREZTRI  AEROSPHERE) 160-9-4.8 MCG/ACT AERO Inhale 2 puffs into the lungs in the morning and at bedtime. 5.9 g 0   Continuous Blood Gluc Sensor (FREESTYLE LIBRE 14 DAY SENSOR) MISC USE TO TEST BLOOD SUGAR THREE TIMES DAILY. E11.51. E11.59 1 each 12   dapagliflozin  propanediol (FARXIGA ) 10 MG TABS tablet Take 1 tablet (10 mg total) by mouth daily before breakfast. Generic 90 tablet 3   Evolocumab  (REPATHA  SURECLICK) 140 MG/ML SOAJ Inject 140 mg into the skin every 14 (fourteen) days. 6 mL 3   fluticasone  (FLONASE ) 50 MCG/ACT nasal spray Place 1 spray into both nostrils daily as needed for allergies or rhinitis. 16 g 11   gabapentin  (NEURONTIN ) 300 MG  capsule TAKE 3 CAPSULES (900 MG TOTAL) BY MOUTH AT BEDTIME. G25.81 270 capsule 0   glucose 4 GM chewable tablet Chew 1 tablet (4 g total) by mouth as needed for low blood sugar. 50 tablet 12   insulin  aspart (NOVOLOG  FLEXPEN) 100 UNIT/ML FlexPen INJECT 15-25 UNITS INTO THE SKIN 3 (THREE) TIMES DAILY WITH MEALS. 30 mL 2   Insulin  Glargine (BASAGLAR  KWIKPEN) 100 UNIT/ML Inject 50 Units into the skin at bedtime.     L-Methylfolate-Algae-B12-B6 (METANX) 3-90.314-2-35 MG CAPS Take 1 tablet PO daily 90 capsule 2   LIFESCAN FINEPOINT LANCETS MISC Use to test for blood sugar three times daily dx: E11.51 300 each 1   metoprolol  succinate (TOPROL -XL) 25 MG 24 hr tablet TAKE 1 TABLET BY MOUTH EVERY DAY WITH OR IMMEDIATELY FOLLOWING A MEAL 90 tablet 1   nitroGLYCERIN  (NITROSTAT ) 0.4 MG SL tablet PLACE 1 TABLET UNDER THE TONGUE EVERY 5 MINUTES AS NEEDED FOR CHEST PAIN. 50 tablet 2   ONETOUCH ULTRA test strip USE TO TEST FOR BLOOD SUGAR THREE TIMES DAILY DX: E11.51 300 strip 11   pantoprazole  (PROTONIX ) 40 MG tablet TAKE ONE TABLET BY MOUTH ONCE DAILY FOR STOMACH 90 tablet 3   pramipexole  (MIRAPEX ) 0.5 MG tablet Take 0.25-0.5 mg by mouth as needed.     tirzepatide  (MOUNJARO ) 2.5 MG/0.5ML Pen Inject 2.5 mg into the skin once a week. 6 mL 3  valsartan -hydrochlorothiazide  (DIOVAN  HCT) 160-25 MG tablet Take 1 tablet by mouth daily. 90 tablet 3   Current Facility-Administered Medications on File Prior to Visit  Medication Dose Route Frequency Provider Last Rate Last Admin   [COMPLETED] hydrALAZINE  (APRESOLINE ) injection 10 mg  10 mg Intravenous Once Ganji, Jay, MD   10 mg at 05/01/21 1054   Allergies  Allergen Reactions   Amlodipine  Other (See Comments)    Worsening restless legs, dyspnea and leg swelling   Lyrica  [Pregabalin ] Other (See Comments)    Made patient very lethargic the next morning, very hard to patient to function, move, etc.    Other Other (See Comments)    Convulsions, Causes Restless Legs,  rhabdomyolysis   Statins Other (See Comments)    Causes Restless Legs, rhabdomyolysis   Ativan  [Lorazepam ] Other (See Comments)    Markedly increased restless legs   Trulicity  [Dulaglutide ] Other (See Comments)    GI side effects    Family History  Adopted: Yes  Problem Relation Age of Onset   Emphysema Mother    PE: BP 104/80   Pulse 64   Ht 5' 8 (1.727 m)   Wt 200 lb (90.7 kg)   SpO2 94%   BMI 30.41 kg/m  Wt Readings from Last 10 Encounters:  12/03/23 200 lb (90.7 kg)  10/31/23 199 lb 9.6 oz (90.5 kg)  10/20/23 205 lb 12.8 oz (93.4 kg)  09/29/23 206 lb 12.8 oz (93.8 kg)  09/12/23 210 lb (95.3 kg)  09/02/23 212 lb (96.2 kg)  08/01/23 207 lb (93.9 kg)  07/18/23 208 lb (94.3 kg)  06/09/23 213 lb 12.8 oz (97 kg)  05/20/23 209 lb 9.6 oz (95.1 kg)   Constitutional: overweight, in NAD Eyes: no exophthalmos ENT: no thyromegaly, no cervical lymphadenopathy Cardiovascular: RRR, No MRG Respiratory: CTA B Musculoskeletal: no deformities Skin:  no rashes Neurological: no tremor with outstretched hands Diabetic Foot Exam - Simple   Simple Foot Form Diabetic Foot exam was performed with the following findings: Yes 12/03/2023  9:05 AM  Visual Inspection No deformities, no ulcerations, no other skin breakdown bilaterally: Yes Sensation Testing Intact to touch and monofilament testing bilaterally: Yes Pulse Check Posterior Tibialis and Dorsalis pulse intact bilaterally: Yes Comments + B nonpitting edema    ASSESSMENT: 1. DM2, insulin -dependent, uncontrolled, with complications - CAD - h/o CVA - CKD stage III - severe hypoglycemia in 2019  2. HL  PLAN:  1. Patient with longstanding, uncontrolled, type 2 diabetes, on basal-bolus insulin  regimen, along with SGLT2 inhibitor (added at last visit), and now back on GLP-1/GIP receptor agonist, with good tolerance at the lowest dose with stable HbA1c at last visit, at 7.5%, above target.  - Of note, we tried a GLP-1 receptor  agonist for him but he had nausea with Ozempic  and Trulicity  in the past.  We also tried Mounjaro  but he had nausea with this, also.  I previously suggested he ask but he had NovoLog  at home and wanted to use this so we discussed about injecting insulin  15 minutes before meals.  We did discuss that if he forgot to take insulin  before meal to only use 50% of the dose if taken after meals. CGM interpretation: -At today's visit, we reviewed his CGM downloads: It appears that 69% of values are in target range (goal >70%), while 33% are higher than 180 (goal <25%), and 1% are lower than 70 (goal <4%).  The calculated average blood sugar is 158.  The projected HbA1c for the next  3 months (GMI) is 7.1%. -Reviewing the CGM trends, sugars appear to be mostly within the target range but with hyperglycemic exceptions and occasional hypoglycemic down to the 50s.  On questioning, he is varying the dose of Basaglar  between 30 and 50 units based on the sugars, we discussed about not doing this.  I advised him to keep a stable dose of Basaglar , at 36 units daily.  We also discussed that he needs to take his mealtime insulin  even if his blood sugars are at goal before meals to avoid immediate postprandial hyperglycemia, and then to correct the high blood sugars after meals with less insulin  to avoid postprandial hypoglycemia.  I am glad that he is able to tolerate Mounjaro  at the lowest dose for now.  He would be interested in trying an increased dose and I advised him to try to double up on the injection and let me know if he can tolerate the 5 mg dose well, in which case I can call in this dose to his pharmacy. - I suggested to:  Patient Instructions  Please continue: - Farxiga  10 mg before b'fast - Mounjaro  2.5 mg weekly (try 5 mg weekly) - NovoLog  10-30 units before meals  Decrease: - Basaglar  36 units daily  Change: - NovoLog  sliding scale:            (150-175: +4 176-200: +6) 201-250: +8 251-300:  +10 301-350: +12 >350: +14 If you have to take the insulin  after a meal, take only 50% of the dose. No NovoLog  at bedtime.  Please return in 3-4 months.  - we checked his HbA1c: 6.9% (better) - advised to check sugars at different times of the day - 4x a day, rotating check times - advised for yearly eye exams >> he is UTD - return to clinic in 3-4 months  2. HL - Latest lipid fractions reviewed from 06/2023: Fractions at goal: Lab Results  Component Value Date   CHOL 102 06/09/2023   HDL 43 06/09/2023   LDLCALC 38 06/09/2023   LDLDIRECT 110 (H) 06/17/2019   TRIG 114 06/09/2023   CHOLHDL 2.4 06/09/2023  - he had side effects from statins: Restless legs and rhabdomyolysis.  He is on Repatha  now, without side effects  Lela Fendt, MD PhD [facility] Endocrinology

## 2023-12-03 NOTE — Patient Instructions (Addendum)
 Please continue: - Farxiga  10 mg before b'fast - Mounjaro  2.5 mg weekly (try 5 mg weekly) - NovoLog  10-30 units before meals  Decrease: - Basaglar  36 units daily  Change: - NovoLog  sliding scale:            (150-175: +4 176-200: +6) 201-250: +8 251-300: +10 301-350: +12 >350: +14 If you have to take the insulin  after a meal, take only 50% of the dose. No NovoLog  at bedtime.  Please return in 3-4 months.

## 2023-12-11 ENCOUNTER — Other Ambulatory Visit: Payer: Self-pay

## 2023-12-13 ENCOUNTER — Other Ambulatory Visit: Payer: Self-pay | Admitting: Nurse Practitioner

## 2023-12-13 ENCOUNTER — Other Ambulatory Visit: Payer: Self-pay | Admitting: Cardiology

## 2023-12-13 DIAGNOSIS — G2581 Restless legs syndrome: Secondary | ICD-10-CM

## 2023-12-15 ENCOUNTER — Other Ambulatory Visit: Payer: Self-pay

## 2023-12-15 ENCOUNTER — Other Ambulatory Visit (HOSPITAL_COMMUNITY): Payer: Self-pay

## 2023-12-23 ENCOUNTER — Other Ambulatory Visit: Payer: Self-pay

## 2024-01-12 NOTE — Progress Notes (Signed)
 NEUROLOGY FOLLOW UP OFFICE NOTE  Kenneth Kirkpatrick Sr. [id number]  Subjective:  Kenneth Carino Sr. is a 73 y.o. year old male with a history of arthritis, GERD, COPD, CAD, HTN, HLD, DM2, iron  deficiency anemia, TIA (01/14/21 - left face droop and blurry vision) who we last saw on 08/01/23 for restless leg syndrome, functional movement disorder, and B12 deficiency.  To briefly review: Patient started having restless legs syndrome. He describes needing to move his legs, then would improve after moving. It would happen worse at night and while in bed. He went untreated for many years. After his first stent in 2014, patient was put on Lipitor. He had such severe shaking, he required hospitalization and narcotics to stop his restless legs. He mentions having problems with his iron . He has taken iron  supplementation and iron  shots. He was not currently on any iron  supplementation at initial visit on 10/22/21.   He has taken many different medications in the past, but is currently on gabapentin  200 mg at bedtime and pramipexole  1mg  at 5 pm, 1mg  at 7 pm, and 2mg  at bedtime (9-10 pm). Previously he did not take pramipexole  later, but now requires it earlier and requires more medication. Symptoms continue to worsen though. He cannot sit down long or symptoms will get severe. He feels like he has to move to keep the symptoms at bay. His symptoms have spread throughout the body now.   Patient was previously seen for restless leg syndrome (in 2015) by Dr. Georjean. To briefly review, per history from clinic note on 09/29/13: was admitted at [facility] last 09/18/2013 for leg pain due to severe RLS, found to have rhabdomyolysis with CK of 4907.  He was admitted for hydration and pain management.  He has had RLS for at least 20 years, initially affecting his legs, however since 2004 his arms have been involved as well.  He has been taking Mirapex  for at least 10 years, and had been takin 0.75mg  dose 1-2 tablets at night.  This dose was  causing him drowsiness.  He saw his PCP on 07/14 due to worsening RLS symptoms. He was tried on Zanaflex  and Mirapex  was discontinued, which did not help.  He restarted Mirapex  and had to take additional doses due to constant limb movements and pain, prompting him to go to the ER.  He was started on Gabapentin  and clonazepam  which did not help much, he was also given Dilaudid  which quieted him down.  He was discharged home then returned to the ER due to persistently restless and painful legs bilaterally. He was described to be literally thrashing about the bed, able to answer questions but in significant distress. He tells me the symptoms would start in his right leg, leg would flex at the hip, then he stretches and leans back because the leg is jerking so violently for up to 1-1/2 hours. This eases off then starts on his left leg.  Walking and moving around helps.  Symptoms improved with pain medication. He saw his PCP a week ago and reported leg swelling, ?due to gabapentin . He was started on Sinemet  10/100mg  TID, which her reports has helped with the RLS symptoms, however wears off in the middle of the night. He would take 1/2 tab Mirapex  when Sinemet  wears off.  Since starting Sinemet , he reports weird dreams but no other side effects.  He had discontinued gabapentin , clonazepam , and Zanaflex .     He has infrequent headaches, dyspnea on exertion (climbing steps), occasional choking. Otherwise  he denies any diplopia, dysarthria, focal numbness/tingling, bowel/bladder dysfunction.  He has a history of C1 fracture at age 57 or 1. No known family history of similar RLS because he was adopted.   Patient has previously tried Mirapex  and Sinemet  as well as gabapentin , clonazepam , zanaflex , and Tramadol .   He endorses occasional discomfort in his legs and swelling of his legs.   He denies fevers, chills, or unexplained weight lost.   EtOH: none  Dietary restrictions: none Family hx of neurologic disorders:  patient is adopted and not sure   At 01/02/22 visit, patient had dyskinesias thought to be due to excess of pramipexole  as he continued to take 3 mg nightly. He was instructed to decrease pramipexole . He described increasing movements continuing on 01/29/22 at which time patient was still taking up to 2 mg of pramipexole  nightly. We again agreed to cut down on the pramipexole  and take gabapentin  instead of RLS symptoms.   He went to [facility] ED on 03/02/22, [facility] ED on 03/04/22, and [facility] again on 03/05/22 for the movements.   [facility] ED assessment/plan: [name] endorses a chronic longstanding history of restless leg syndrome which per history appears consistent with, also presents with years of abnormal uncontrollable movements that are worse at rest and specifically at nighttime. On history it appears the symptoms started sometime after being treated for restless leg syndrome with pramipexole . On exam there is no significant bradykinesia rigidity or postural action tremor. Abnormal movements involving his trunk and lower extremities are observed consistent and concerning for dyskinesia. Overall believe his presentation is consistent with likely a chronic history of restless leg syndrome and a secondary process of dyskinesia in the setting of chronic pramipexole  use. Further workup and recommendations as below. - Stop taking pramipexole ! - Continue home iron  supplementations as tolerated - Continue Gabapentin , take 300 mg in AM, 300 mg at 12:00 PM (noon), 900 mg at night - They recommend follow up with [facility]. A referral has been placed, and you should be hearing from them.  - They recommend improving sleep hygiene with: Setting regular sleep hours, avoiding electronics in bed, practice visualization exercises if he cannot fall asleep, limit liquids prior to sleep, and eliminate all naps.    Patient mentioned that he can set his watch to his symptoms. He knows when he sits down to eat in the  evening, he is going to be shaking. He will take medication then. After his meal, his symptoms will get worse. He will take more medication. He will get worse throughout the night. He will have to keep moving, either on a treadmill or in his shop. This is help with symptoms. In the past, he would get drowsy then finally go to sleep, but not sleep all night. Wife mentions that these jerking movements have been present for years, but maybe worse now (since his 27s per wife). It has gotten worse over the last few months since our treatment plan began.    Wife provided a video from ED on 03/05/22 in which patient had intermittent quick jerking of all extremities, including in the head and neck. It was not rhythmic and without clear pattern. After getting permission from patient in writing (will be scanned into chart), I recorded the recording and patient's jerks today with office iPad.   04/10/22: Patient was having increasing RLS symptoms. He mentioned he was taking 1/2 tablet of pramipexole  on 03/20/22, but not every night, and this was helping. I recommended he take  1/2 tablet of pramipexole  nightly on 03/21/22 as movements I saw were no longer felt to be dyskinesias.    Overall, his symptoms are about the same. He has good days and bad days. He will go a couple of nights without sleeping well, and then will sleep well for a couple of days. Last night he walked from 7-9 pm. He slept at 9pm to 3am (waking to go to bathroom). He then slept until 5 am. That was a good night.   He is still attempting to get in touch with Clotilda Show to set up an appointment for FND, but has not yet been able to reach her.   He continues to take gabapentin  300 mg qhs and pramipexole  1/2 tablet (0.50 mg).    Patient went to sleep medicine who recommended a sleep study. He was told he had sleep apnea. He is unsure of follow up plans of now.   07/24/22: Patient was doing well for the last couple of months. He has not been able  to sleep over the last 4-5 nights due to shaking though.   He is taking pramipexole  0.25 mg and gabapentin  200 mg around 6 pm. That will help until 8:30pm (bedtime) when he will take pramipexole  0.25 mg and gabapentin  200 mg and a tylenol  PM. This was working until the last few nights.   Patient had a sleep study that showed severe sleep apnea. Patient mentions that he thought he was supposed to hear back about CPAP, but has not.   Patient had mentioned a message therapist, but has not been. Patient is also still on the wait list for the [facility] therapist in [facility]).   01/29/23: Patient is having right knee pain. He is seeing sports medicine and having an MRI soon. He has had a couple of falls due to the knee pain. He mentions that he has a couple of bulging disks in his low back that is also being followed by them.   Regarding his RLS, it fluctuates. He continues to have severe symptoms at time. Symptoms continue to be worse at night. He will take 300 mg of gabapentin  and 0.5 mg of pramipexole  around 5 pm. Around 8-8:30 pm, he will take another 300 mg of gabapentin  and 0.5 mg to 1 mg of pramipexole . He will also take a tylenol  PM to help him sleep. He takes melatonin and magnesium  as well. He ran out of iron  supplement, so he has not been taking this for about 3 months. He is also no longer taking B12.   Patient has had the sleep study, but has not seen sleep medicine. He continues to stop breathing while sleeping per wife.   Clotilda Show has been in contact with patient, but she currently has no openings to see patient.   08/01/23: Repeat ferritin was 235 after last clinic visit. B12 was also normal (384).   He had lumbar spine surgery in 05/2023. His pain in the back and right leg have improved.   His restless leg symptoms are worse. It is more violent than before. It is still occurring in late afternoon and evening.   He is taking pramipexole  0.25 mg in the afternoon and  0.50 mg around bedtime. He takes gabapentin  300 mg at 5 pm and 600 mg at bedtime. He is not taking iron  supplementation.   He has severe OSA. He has not seen sleep medicine. He states no one contacted him.   He has no new complaints.  Most recent Assessment and Plan (08/01/23): This is Kenneth Morones Sr., a 73 y.o. male with: Restless leg syndrome - complicated by iron  deficiency, normalized with iron  supplementation on most recent testing. No evidence of parkinsonism on exam. He also has augmentation from pramipexole  which we have previously discussed. He increased his dosage from 0.50 mg to 0.75 mg total since last visit. We discussed the importance of reducing and stopping this medication for long term control of RLS. 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 B12 deficiency - not currently on supplementation. Most recent B12 level was normal. OSA - severe by sleep study on 04/08/22. Has not followed up with sleep medicine. This is likely contributing to RLS and overall quality of life.   Plan: -Sleep medicine referral for OSA -Continue gabapentin  900 mg nightly -Reduce pramipexole  to 0.50 mg nightly for 1 month, then reduce to 0.25 mg nightly for 1 month, then stop  Since their last visit: Patient saw sleep. He had severe OSA. He is now on CPAP. He is only sleeping about 4 hours a night for many years. He is not as tired as prior. He sometimes has nights where he can't lay still at all, so he won't sleep.   He endorses taking pramipexole  1.5 mg most nights. He was previously taking 0.75 mg nightly. This is despite our plan to stop the medication. He will sometimes take extra gabapentin , up to 1800 mg on one occasion. He does not think gabapentin  does much.  He continues to have episodes of shaking all over. Video provided by wife continues to look like  FND.   MEDICATIONS:  Outpatient Encounter Medications as of 01/21/2024  Medication Sig   acetaminophen  (TYLENOL ) 500 MG tablet Take 500 mg by mouth every 6 (six) hours as needed for mild pain, moderate pain, headache or fever.   albuterol  (VENTOLIN  HFA) 108 (90 Base) MCG/ACT inhaler TAKE 2 PUFFS BY MOUTH EVERY 6 HOURS AS NEEDED FOR WHEEZE OR SHORTNESS OF BREATH   aspirin  EC 81 MG tablet Take 1 tablet (81 mg total) by mouth daily. Swallow whole.   BD PEN NEEDLE NANO 2ND GEN 32G X 4 MM MISC USE AS DIRECTED   Budeson-Glycopyrrol-Formoterol (BREZTRI  AEROSPHERE) 160-9-4.8 MCG/ACT AERO Inhale 2 puffs into the lungs in the morning and at bedtime.   Continuous Blood Gluc Sensor (FREESTYLE LIBRE 14 DAY SENSOR) MISC USE TO TEST BLOOD SUGAR THREE TIMES DAILY. E11.51. E11.59   dapagliflozin  propanediol (FARXIGA ) 10 MG TABS tablet Take 1 tablet (10 mg total) by mouth daily before breakfast. Generic   Evolocumab  (REPATHA  SURECLICK) 140 MG/ML SOAJ Inject 140 mg into the skin every 14 (fourteen) days.   fluticasone  (FLONASE ) 50 MCG/ACT nasal spray Place 1 spray into both nostrils daily as needed for allergies or rhinitis.   gabapentin  (NEURONTIN ) 300 MG capsule Take 2 capsules (600 mg total) by mouth 2 (two) times daily. G25.81   glucose 4 GM chewable tablet Chew 1 tablet (4 g total) by mouth as needed for low blood sugar.   insulin  aspart (NOVOLOG  FLEXPEN) 100 UNIT/ML FlexPen INJECT 15-25 UNITS INTO THE SKIN 3 (THREE) TIMES DAILY WITH MEALS.   Insulin  Glargine (BASAGLAR  KWIKPEN) 100 UNIT/ML Inject 30 Units into the skin at bedtime.   L-Methylfolate-Algae-B12-B6 (METANX) 3-90.314-2-35 MG CAPS Take 1 tablet PO daily   LIFESCAN FINEPOINT LANCETS MISC Use to test for blood sugar three times daily  dx: E11.51   metoprolol  succinate (TOPROL -XL) 25 MG 24 hr tablet TAKE 1 TABLET BY MOUTH EVERY DAY WITH OR IMMEDIATELY FOLLOWING A MEAL   nitroGLYCERIN  (NITROSTAT ) 0.4 MG SL tablet PLACE 1 TABLET UNDER THE TONGUE EVERY 5  MINUTES AS NEEDED FOR CHEST PAIN.   ONETOUCH ULTRA test strip USE TO TEST FOR BLOOD SUGAR THREE TIMES DAILY DX: E11.51   pantoprazole  (PROTONIX ) 40 MG tablet TAKE ONE TABLET BY MOUTH ONCE DAILY FOR STOMACH   pramipexole  (MIRAPEX ) 0.5 MG tablet Take 0.25-0.5 mg by mouth as needed.   tirzepatide  (MOUNJARO ) 2.5 MG/0.5ML Pen Inject 2.5 mg into the skin once a week.   valsartan -hydrochlorothiazide  (DIOVAN -HCT) 160-25 MG tablet TAKE 1 TABLET BY MOUTH EVERY DAY   [DISCONTINUED] gabapentin  (NEURONTIN ) 300 MG capsule TAKE 3 CAPSULES (900 MG TOTAL) BY MOUTH AT BEDTIME. G25.81   Facility-Administered Encounter Medications as of 01/21/2024  Medication   [COMPLETED] hydrALAZINE  (APRESOLINE ) injection 10 mg    PAST MEDICAL HISTORY: Past Medical History:  Diagnosis Date   Arthritis    left thumb (05/23/2017) right shoulder   Barrett's esophagus    we've been told that it's all gone; still take RX for GERD (05/23/2017)   Bilateral swelling of feet and ankles x 3 weeks as of 12-01-2019   CAD (coronary artery disease), native coronary artery    Coronary angiogram 05/03/2014:  Proximal LAD 3.0x12 mm Promus premier stent.  04/08/2014: Mid Cx 3.5 x 16 mm Promus DES.  03/29/2013: Mid LAD 2.75 x 38 mm Promus Premier drug-eluting stent, balloon angioplasty of D1 and distal LAD and stenting of distal RCA with 2.75 x 24 mm promos Premier drug-eluting stent 12/15/2012 widely patent.   Cervicalgia    Chronic bronchitis (HCC)    q yr (05/23/2017)   Complication of anesthesia     I do not wake up very well   slow to wake up   COVID-19 05/2019   all covid symptoms in hospital x 5 days, all symptoms resolved took monoclonal antibody tx    Dyspnea    with exertion   Elevated LFTs 02/06/2021   Family history of adverse reaction to anesthesia    son w/PONV   GERD (gastroesophageal reflux disease)    Heart murmur    grew out of it    Hyperlipidemia    Hypertension    Hypoglycemia, unspecified    IDDM  (insulin  dependent diabetes mellitus)    type 2   Impotence of organic origin    Iron  deficiency anemia    Memory loss    mild   Other malaise and fatigue    Pneumonia 05/2019   PONV (postoperative nausea and vomiting)    after wisdom teeth pulled, no problems with other surgeries   Restless leg    Rotator cuff arthropathy    Stroke (HCC)    Tension headache    sometimes (05/23/2017)   TIA (transient ischemic attack) 01/14/2021   Unspecified gastritis and gastroduodenitis with hemorrhage    Unstable angina pectoris (HCC) 04/08/2014   Coronary angiogram 05/03/2014:  Proximal LAD 3.0x12 mm Promus premier stent.  04/08/2014: Mid Cx 3.5 x 16 mm Promus DES.  03/29/2013: Mid LAD 2.75 x 38 mm Promus Premier drug-eluting stent, balloon angioplasty of D1 and distal LAD and stenting of distal RCA with 2.75 x 24 mm promos Premier drug-eluting stent 12/15/2012 widely patent.    PAST SURGICAL HISTORY: Past Surgical History:  Procedure Laterality Date   BACK SURGERY Bilateral 05/28/2023   BICEPT TENODESIS  Right 12/07/2019   Procedure: RIGHT BICEPS TENODESIS;  Surgeon: Beverley Evalene BIRCH, MD;  Location: [facility];  Service: Orthopedics;  Laterality: Right;   CARDIAC CATHETERIZATION N/A 09/25/2015   Procedure: Left Heart Cath and Coronary Angiography;  Surgeon: Gordy Bergamo, MD;  Location[facility] INVASIVE CV LAB;  Service: Cardiovascular;  Laterality: N/A;   CARDIAC CATHETERIZATION N/A 09/25/2015   Procedure: Intravascular Pressure Wire/FFR Study;  Surgeon: Gordy Bergamo, MD;  Location: [facility] INVASIVE CV LAB;  Service: Cardiovascular;  Laterality: N/A;   CORONARY ANGIOPLASTY WITH STENT PLACEMENT  12/15/2012; 04/28/2014; 05/03/2014   2; 1; 1 , 4 stents 2 balloons   CORONARY STENT INTERVENTION N/A 05/01/2021   Procedure: CORONARY STENT INTERVENTION;  Surgeon: Bergamo Gordy, MD;  Location: [facility] INVASIVE CV LAB;  Service: Cardiovascular;  Laterality: N/A;   FRACTIONAL FLOW RESERVE WIRE Right 03/26/2013    Procedure: FRACTIONAL FLOW RESERVE WIRE;  Surgeon: Erick JONELLE Bergamo, MD;  Location: [facility] CATH LAB;  Service: Cardiovascular;  Laterality: Right;   FRACTIONAL FLOW RESERVE WIRE N/A 05/03/2014   Procedure: FRACTIONAL FLOW RESERVE WIRE;  Surgeon: Erick JONELLE Bergamo, MD;  Location: [facility] CATH LAB;  Service: Cardiovascular;  Laterality: N/A;   HERNIA REPAIR  2010   umbilical    INCISION AND DRAINAGE ABSCESS Left 05/2007   groin    KNEE SURGERY Right 1992;  2000   calcium  deposits removed (12/15/2012)   LEFT HEART CATH AND CORONARY ANGIOGRAPHY N/A 05/23/2017   Procedure: LEFT HEART CATH AND CORONARY ANGIOGRAPHY;  Surgeon: Bergamo Gordy, MD;  Location: [facility] INVASIVE CV LAB;  Service: Cardiovascular;  Laterality: N/A;   LEFT HEART CATH AND CORONARY ANGIOGRAPHY N/A 05/01/2021   Procedure: LEFT HEART CATH AND CORONARY ANGIOGRAPHY;  Surgeon: Bergamo Gordy, MD;  Location: [facility] INVASIVE CV LAB;  Service: Cardiovascular;  Laterality: N/A;   LEFT HEART CATHETERIZATION WITH CORONARY ANGIOGRAM N/A 12/15/2012   Procedure: LEFT HEART CATHETERIZATION WITH CORONARY ANGIOGRAM;  Surgeon: Erick JONELLE Bergamo, MD;  Location[facility] CATH LAB;  Service: Cardiovascular;  Laterality: N/A;   LEFT HEART CATHETERIZATION WITH CORONARY ANGIOGRAM N/A 03/26/2013   Procedure: LEFT HEART CATHETERIZATION WITH CORONARY ANGIOGRAM;  Surgeon: Erick JONELLE Bergamo, MD;  Location[facility] CATH LAB;  Service: Cardiovascular;  Laterality: N/A;   LEFT HEART CATHETERIZATION WITH CORONARY ANGIOGRAM N/A 04/08/2014   Procedure: LEFT HEART CATHETERIZATION WITH CORONARY ANGIOGRAM;  Surgeon: Ozell BIRCH Fell, MD;  Location: [facility] CATH LAB;  Service: Cardiovascular;  Laterality: N/A;   MOUTH SURGERY Right    PERCUTANEOUS CORONARY STENT INTERVENTION (PCI-S) N/A 05/03/2014   Procedure: PERCUTANEOUS CORONARY STENT INTERVENTION (PCI-S);  Surgeon: Erick JONELLE Bergamo, MD;  Location: [facility] CATH LAB;  Service: Cardiovascular;  Laterality: N/A;   RADIOLOGY WITH ANESTHESIA N/A 02/08/2021    Procedure: MRI WITH ANESTHESIA OF ABDOMEN WITH AND WITHOUT CONSTRAST;  Surgeon: Radiologist, Medication, MD;  Location: [facility] OR;  Service: Radiology;  Laterality: N/A;   ROTATOR CUFF REPAIR  2022   UMBILICAL HERNIA REPAIR  yrs ago    ALLERGIES: Allergies  Allergen Reactions   Amlodipine  Other (See Comments)    Worsening restless legs, dyspnea and leg swelling   Lyrica  [Pregabalin ] Other (See Comments)    Made patient very lethargic the next morning, very hard to patient to function, move, etc.    Other Other (See Comments)    Convulsions, Causes Restless Legs, rhabdomyolysis   Statins Other (See Comments)    Causes Restless Legs, rhabdomyolysis   Ativan  [Lorazepam ] Other (See Comments)    Markedly increased restless  legs   Trulicity  [Dulaglutide ] Other (See Comments)    GI side effects     FAMILY HISTORY: Family History  Adopted: Yes  Problem Relation Age of Onset   Emphysema Mother     SOCIAL HISTORY: Social History   Tobacco Use   Smoking status: Never   Smokeless tobacco: Never  Vaping Use   Vaping status: Never Used  Substance Use Topics   Alcohol use: No   Drug use: No   Social History   Social History Narrative   Right handed    Caffeine 2 cups daily   Winter time 4 cups daily   Lives family one level home    Semi Retired      Objective:  Vital Signs:  BP (!) 143/81   Pulse 67   Ht 5' 8 (1.727 m)   Wt 199 lb (90.3 kg)   SpO2 96%   BMI 30.26 kg/m   General: No acute distress.  Patient appears well-groomed.   Head:  Normocephalic/atraumatic Neck: supple Heart: regular rate and rhythm Lungs: Clear to auscultation bilaterally. Vascular: No carotid bruits.  Neurological Exam: Mental status: alert and oriented, speech fluent and not dysarthric, language intact.  Cranial nerves: CN I: not tested CN II: pupils equal, round and reactive to light, visual fields intact CN III, IV, VI:  full range of motion, no nystagmus, no ptosis CN V: facial  sensation intact. CN VII: upper and lower face symmetric CN VIII: hearing intact CN IX, X: uvula midline CN XI: sternocleidomastoid and trapezius muscles intact CN XII: tongue midline  Bulk & Tone: normal, no abnormal movements appreciated Motor:  muscle strength 5/5 throughout Deep Tendon Reflexes:  2+ throughout except absent at ankles.   Sensation:  Light touch sensation intact. Finger to nose testing:  Without dysmetria.   Coordination: Normal finger tapping Gait:  Antalgic gait.  Romberg negative.   Labs and Imaging review: New results: 12/03/23: HbA1c: 6.9  09/29/23: CMP significant for BUN 30, Cr 1.50 CBC w/ diff unremarkable  Sleep study (11/11/23): Severe level of abnormal breathing events with clinically significant levels of oxygen  desaturation.  Previously reviewed results: 01/29/23: B12: 384 Iron  studies significant for ferritin 235, sat % 42   HbA1c (04/03/23): 9.5 HbA1c (05/20/23): 7.5   CBC (12/24/22): significant for chronic thrombocytopenia (platelets 124) BMP (12/23/22): significant for glucose of 139, Cr 1.62, BUN 39 HbA1c (12/06/22): 8.4   07/22/22: Iron  studies: normal (ferritin 173) HbA1c: 9.1   04/10/22:  Iron  studies: normal (ferritin 119)   HbA1c (02/28/22): 7.8   10/22/21: Ferritin 21 B12: 248 Normal or unremarkable: Vit D CMP significant for elevated glucose (193) and BUN (28) CBC significant for platelets of 110 (chronic)   EMG (03/23/14): Normal study of RUE and RLE (no myopathy, radiculopathy, or peripheral neuropathy).   MRI brain, MRA head and neck (01/14/21): IMPRESSION: MRI HEAD:   Normal brain MRI for age. No acute intracranial infarct or other abnormality identified.   MRA HEAD:   Normal intracranial MRA. No large vessel occlusion or hemodynamically significant stenosis. No aneurysm.   MRA NECK:   1. 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 The distal right V4  segment is patent on corresponding MRA head portion of this exam with perfusion of the right PICA. 2. Widely patent dominant left vertebral artery. 3. Wide patency of both carotid artery systems within the neck.   Sleep study (04/08/22): IMPRESSIONS - Severe Obstructive Sleep apnea(OSA) Sub-Optimal  pressure attained. - Electrocardiographic data showed presence of PVCs. - No Significant Central Sleep Apnea (CSA) - Moderate Oxygen  Desaturation - The patient snored with moderate snoring volume. - EEG did not show alpha intrusion. - No significant periodic leg movements(PLMs) during sleep. However, no significant associated arousals. - Reduced sleep efficiency, normal primary sleep latency, long REM sleep latency and no slow wave latency. - Sleep efficiency was significantly reduced by multiple episodes of hypoglycemia and intervention, leg cramping, pain and discomfort,  Assessment/Plan:  This is Kenneth Morones Sr., a 73 y.o. male with: Restless leg syndrome - complicated by iron  deficiency, normalized with iron  supplementation on most recent testing. No longer on iron  supplementation. No evidence of parkinsonism on exam. He also has augmentation from pramipexole  which we have previously discussed. He increased his dosage from 0.75 mg to 1.5 mg total since last visit. He had increased prior to last visit as well. We discussed the importance of reducing and stopping this medication for long term control of RLS as we do each visit. He is unsure if gabapentin  is helping, which I understand, but tried to explain we cannot know if he continues to take higher doses of pramipexole  which can make RLS worse. 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 Could also be due to too much dopamine from pramipexole . B12 deficiency - not currently on supplementation. Most recent B12 level was  normal. OSA - severe by sleep study on 04/08/22. Now on CPAP. Still only sleeping about 4 hours per night though. This is likely contributing to RLS and overall quality of life.   Plan: -Iron  studies and B12 today -Patient to let me know dosage of pramipexole  he is taking -Will plan to reduce pramipexole  by 0.25 mg per month until he is on 0.25 mg daily -Will continue gabapentin . Will take 600 mg in the afternoon and 600 mg in the evening -May consider switching gabapentin  to Lyrica  if gabapentin  is not helping  Return to clinic in 6 months  Total time spent reviewing records, interview, history/exam, documentation, and coordination of care on day of encounter:  40 min  Venetia Potters, MD

## 2024-01-15 ENCOUNTER — Encounter: Payer: Self-pay | Admitting: Dietician

## 2024-01-15 ENCOUNTER — Encounter: Attending: Internal Medicine | Admitting: Dietician

## 2024-01-15 VITALS — Wt 198.0 lb

## 2024-01-15 DIAGNOSIS — L578 Other skin changes due to chronic exposure to nonionizing radiation: Secondary | ICD-10-CM | POA: Diagnosis not present

## 2024-01-15 DIAGNOSIS — L821 Other seborrheic keratosis: Secondary | ICD-10-CM | POA: Diagnosis not present

## 2024-01-15 DIAGNOSIS — B079 Viral wart, unspecified: Secondary | ICD-10-CM | POA: Diagnosis not present

## 2024-01-15 DIAGNOSIS — Z85828 Personal history of other malignant neoplasm of skin: Secondary | ICD-10-CM | POA: Diagnosis not present

## 2024-01-15 DIAGNOSIS — E1165 Type 2 diabetes mellitus with hyperglycemia: Secondary | ICD-10-CM | POA: Insufficient documentation

## 2024-01-15 DIAGNOSIS — L814 Other melanin hyperpigmentation: Secondary | ICD-10-CM | POA: Diagnosis not present

## 2024-01-15 DIAGNOSIS — Z08 Encounter for follow-up examination after completed treatment for malignant neoplasm: Secondary | ICD-10-CM | POA: Diagnosis not present

## 2024-01-15 DIAGNOSIS — Z794 Long term (current) use of insulin: Secondary | ICD-10-CM | POA: Diagnosis present

## 2024-01-15 DIAGNOSIS — D1801 Hemangioma of skin and subcutaneous tissue: Secondary | ICD-10-CM | POA: Diagnosis not present

## 2024-01-15 DIAGNOSIS — D485 Neoplasm of uncertain behavior of skin: Secondary | ICD-10-CM | POA: Diagnosis not present

## 2024-01-15 NOTE — Patient Instructions (Addendum)
 Consider the following vitamins (or a multivitamin with the following):  Methlyfolate/B12/B6  Vitamin D   Avoid eating when not hungry Limit fun foods  - I don't choose to have that now. Focus on nutrition quality.  Mindfulness:  Consistently scheduled meal - avoid skipping  Choices  Eat slowly  Away from distraction (sitting in kitchen or dining room)  Stop eating when satisfied  Before a snack ask, Am I hungry or eating for another reason?   What can I do instead if I am not hungry?  Try to find something every day that brings you joy!  Better insulin  site rotation.

## 2024-01-15 NOTE — Progress Notes (Signed)
 Medical Nutrition Therapy  Appointment Start time:  1040 Appointment End time:  1115 Patient is here today with his wife [name].  They were last seen by myself 07/18/2023.  He states that he has to stand as his legs shake when he sits.  Increased restless legs which happens when he relaxes (sits and lays down).  He states that he has seen 5 neurologists for this without cause.  They are weaning the pramipexole  and changed to gabapentin . Not sleeping well due to the issues. Does use C-pap. Stopped Methylfolate/B6/B12 Farxiga , Basaglar  30 units q HS, Novolog  10-30 units before meals plus sliding scale, mounjaro - doesn't think that it is reducing his appetite much now.  Primary concerns today: They would like to know how to meal plan and carb count  Referral diagnosis: Type 2 Diabetes Preferred learning style: no preference indicated Learning readiness: ready, change in progress   NUTRITION ASSESSMENT  68 198 lbs 01/15/2024 194 lbs this summer 208 lbs 07/18/2023 178 lbs 02/2024 and gained due to his back and decreased activity 162 lbs lowest adult weight.  Clinical Medical Hx: Type 2 diabetes, HLD, HTN, GERD, stroke, CAD Medications: Farxiga , 30 units Basaglar  q HS, Novolog  - 10-30 units before each meal and a correction dose of 5-7 units between meals if needed, Praluent , valsartan -hydrochlorothiazide , metoprolol , pramipexole  for restless legs Labs: A1C 6.9% 12/03/2023 decreased from 7.5% 3/182025,  9.5% 04/03/2023 Notable Signs/Symptoms: none CGM:  Libre 3  CGM Results from download: 05/18/2023  % Time CGM active:   96 %   (Goal >70%)  Average glucose:  146 mg/dL for 14 days  Glucose management indicator:   6.8 %  Time in range (70-180 mg/dL):   81 %   (Goal >29%)  Time High (181-250 mg/dL):   17 %   (Goal < 74%)  Time Very High (>250 mg/dL):    1 %   (Goal < 5%)  Time Low (54-69 mg/dL):   1 %   (Goal <5%)  Time Very Low (<54 mg/dL):   0 %   (Goal <8%)  %CV (glucose variability)      %  (Goal <36%)   Lifestyle & Dietary Hx 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 Hey are eating more meals at home since his wife has retired 11/2022.  Estimated daily fluid intake: 40-60 oz Supplements: none Sleep: poor due to restless legs Stress / self-care: normal Current average weekly physical activity: hasn't been able to for the past 8 months due to his back.  Played golf and walked prior to this.  24-Hr Dietary Recall First Meal: 1/2 country ham and egg on a bun, 2 cups black coffee Snack:  Second Meal: hamburger on a bun, small nutty buddy ice cream cone Snack: 1 regular size snickers bar as he was getting low (playing golf) Third Meal: fried chicken sandwich with slaw and BBQ sauce, fries Snack: 1 cup ice cream Beverages: water, diet coke, black coffee    NUTRITION DIAGNOSIS  NB-1.1 Food and nutrition-related knowledge deficit As related to balance of carbohydrates, protein, and fat.  As evidenced by diet hx and patient report.   NUTRITION INTERVENTION  Nutrition education (E-1) on the following topics:  Discussed timing of meal bolus and to add the correction dose and meal dose together. Discussed counting carbohydrates by portion size and label reading Discussed his insulin  to carb ratio (1:2-1:4) based on current  insulin  use and calculations  and an insulin  sensitivity factor of 10-15 based on calculations and current scale.  Patient and wife are familiar with how to use these and dose insulin  accordingly. Discussed insulin  stacking and importance of bolusing at the correct time to avoid low blood glucose. Discussed mindfulness with eating.  Carbs with meals in consistent amounts.  Snack only if hungry.  Consider changing meal times if needed to avoid snacking or becoming over hungry. Discussed saturated fat content of food and to choose lean choices Discussed a  balanced plate way of meal planning.  Looking at proportions of foods on the plate.  Handouts Provided Include  Meal plan card My plate Diabetes Resources Label reading Snack list  Learning Style & Readiness for Change Teaching method utilized: Visual & Auditory  Demonstrated degree of understanding via: Teach Back  Barriers to learning/adherence to lifestyle change: none  Goals Established by Pt Consider the following vitamins (or a multivitamin with the following):  Methlyfolate/B12/B6  Vitamin D   Avoid eating when not hungry Limit fun foods  - I don't choose to have that now. Focus on nutrition quality.  Mindfulness:  Consistently scheduled meal - avoid skipping  Choices  Eat slowly  Away from distraction (sitting in kitchen or dining room)  Stop eating when satisfied  Before a snack ask, Am I hungry or eating for another reason?   What can I do instead if I am not hungry?  Try to find something every day that brings you joy!  Better insulin  site rotation.  MONITORING & EVALUATION Dietary intake, weekly physical activity, and label reading in 2 months.  Next Steps  Patient is to call for questions.

## 2024-01-20 DIAGNOSIS — E119 Type 2 diabetes mellitus without complications: Secondary | ICD-10-CM | POA: Diagnosis not present

## 2024-01-21 ENCOUNTER — Encounter: Payer: Self-pay | Admitting: Neurology

## 2024-01-21 ENCOUNTER — Ambulatory Visit (INDEPENDENT_AMBULATORY_CARE_PROVIDER_SITE_OTHER): Admitting: Neurology

## 2024-01-21 ENCOUNTER — Other Ambulatory Visit

## 2024-01-21 VITALS — BP 143/81 | HR 67 | Ht 68.0 in | Wt 199.0 lb

## 2024-01-21 DIAGNOSIS — G2581 Restless legs syndrome: Secondary | ICD-10-CM

## 2024-01-21 DIAGNOSIS — G4733 Obstructive sleep apnea (adult) (pediatric): Secondary | ICD-10-CM

## 2024-01-21 DIAGNOSIS — E559 Vitamin D deficiency, unspecified: Secondary | ICD-10-CM | POA: Diagnosis not present

## 2024-01-21 DIAGNOSIS — G259 Extrapyramidal and movement disorder, unspecified: Secondary | ICD-10-CM | POA: Diagnosis not present

## 2024-01-21 DIAGNOSIS — E538 Deficiency of other specified B group vitamins: Secondary | ICD-10-CM | POA: Diagnosis not present

## 2024-01-21 DIAGNOSIS — E611 Iron deficiency: Secondary | ICD-10-CM | POA: Diagnosis not present

## 2024-01-21 MED ORDER — GABAPENTIN 300 MG PO CAPS: 600.0000 mg | ORAL_CAPSULE | Freq: Two times a day (BID) | ORAL | 3 refills | Status: AC

## 2024-01-21 NOTE — Patient Instructions (Signed)
-  Iron  studies today  -You are to let me know dosage of pramipexole  you are taking  -Will plan to reduce pramipexole  by 0.25 mg per month until you are on 0.25 mg daily  -Will continue gabapentin . Will take 600 mg in the afternoon and 600 mg in the evening  Return to clinic in 6 months   Please let me know if you have any questions or concerns in the meantime.  The physicians and staff at [facility] are committed to providing excellent care. You may receive a survey requesting feedback about your experience at our office. We strive to receive very good responses to the survey questions. If you feel that your experience would prevent you from giving the office a very good  response, please contact our office to try to remedy the situation. We may be reached at [phone]. Thank you for taking the time out of your busy day to complete the survey.  Venetia Potters, MD [facility]

## 2024-01-22 ENCOUNTER — Ambulatory Visit: Payer: Self-pay | Admitting: Neurology

## 2024-01-22 ENCOUNTER — Other Ambulatory Visit: Payer: Self-pay | Admitting: Neurology

## 2024-01-22 DIAGNOSIS — G2581 Restless legs syndrome: Secondary | ICD-10-CM

## 2024-01-22 LAB — IRON,TIBC AND FERRITIN PANEL
%SAT: 23 % (ref 20–48)
Ferritin: 53 ng/mL (ref 24–380)
Iron: 66 ug/dL (ref 50–380)
TIBC: 289 ug/dL (ref 250–425)

## 2024-01-22 LAB — VITAMIN B12: Vitamin B-12: 285 pg/mL (ref 200–1100)

## 2024-01-22 MED ORDER — PRAMIPEXOLE DIHYDROCHLORIDE 0.25 MG PO TABS: ORAL_TABLET | ORAL | 0 refills | Status: AC

## 2024-01-23 LAB — OPHTHALMOLOGY REPORT-SCANNED

## 2024-02-02 ENCOUNTER — Other Ambulatory Visit (HOSPITAL_COMMUNITY): Payer: Self-pay

## 2024-02-02 ENCOUNTER — Encounter (HOSPITAL_COMMUNITY): Payer: Self-pay

## 2024-02-04 ENCOUNTER — Encounter: Payer: Self-pay | Admitting: Nurse Practitioner

## 2024-02-04 ENCOUNTER — Ambulatory Visit: Admitting: Nurse Practitioner

## 2024-02-04 ENCOUNTER — Other Ambulatory Visit: Payer: Self-pay | Admitting: Nurse Practitioner

## 2024-02-04 ENCOUNTER — Encounter: Payer: Self-pay | Admitting: *Deleted

## 2024-02-04 VITALS — BP 118/60 | HR 68 | Temp 97.8°F | Ht 68.0 in | Wt 199.8 lb

## 2024-02-04 DIAGNOSIS — G4733 Obstructive sleep apnea (adult) (pediatric): Secondary | ICD-10-CM

## 2024-02-04 DIAGNOSIS — G2581 Restless legs syndrome: Secondary | ICD-10-CM

## 2024-02-04 NOTE — Patient Instructions (Addendum)
 Continue to use CPAP every night, minimum of 4-6 hours a night.  Change equipment as directed. Wash your tubing with warm soap and water daily, hang to dry. Wash humidifier portion weekly. Use bottled, distilled water and change daily Be aware of reduced alertness and do not drive or operate heavy machinery if experiencing this or drowsiness.  Exercise encouraged, as tolerated. Healthy weight management discussed.  Avoid or decrease alcohol consumption and medications that make you more sleepy, if possible. Notify if persistent daytime sleepiness occurs even with consistent use of PAP therapy.  Change CPAP supplies... Every month Mask cushions and/or nasal pillows CPAP machine filters Every 3 months Mask frame (not including the headgear) CPAP tubing Every 6 months Mask headgear Chin strap (if applicable) Humidifier water tub   Adjusted CPAP to settings to a set pressure of 13 cmH2O   We will see if we can get the Nidra device covered by insurance for your restless leg Continue follow up with neurology as scheduled  Referral to Dr. Leopoldo with [facility] for restless leg syndrome    Follow up in 6-8 weeks with Dr. Neda, Dr. Olena or Dr. Theodoro for OSA/RLS to establish care. If symptoms do not improve or worsen, please contact office for sooner follow up

## 2024-02-04 NOTE — Progress Notes (Signed)
 @Patient  ID: Kenneth Morones Sr., male    DOB: 1950-08-07, 73 y.o.   MRN: [id number]  Chief Complaint  Patient presents with   Obstructive Sleep Apnea    Wearing CPAP-doing good, comes off a lot with turning.    Referring provider: Ladona Heinz, MD  HPI: 73 year old male, never smoker followed for severe OSA and DOE. He was last seen in office 10/20/2023 by Dr. Jess. Past medical history significant for DM2, CAD, HTN, hx of CVA, CKD stage III, GERD, mild PAH, RLS.  TEST/EVENTS:  02/11/2021 CTA chest: No evidence of PE.  Atherosclerosis.  No LAD.  Evaluation of lung parenchyma limited.  No acute process or suspicious nodules. 01/02/2022 PFT: FVC 91, FEV1 103, ratio 88, TLC 80, DLCOcor 85.  No BD.  04/08/2022 split night >> severe OSA 51/h without optimal titration study  11/11/2023 HST: AHI 44.8/h, SpO2 low 80%  01/17/2022: OV with Dr. Kassie. Initially seen in September 2023 for consult due to SOB with onset around November 2022 when he underwent cardiac stent. Initially had symptoms at night where he felt he was drowning.  Nighttime symptoms resolved.  He continued to have dyspnea during exertion including walking and using upper body to carry things.  Did not have any cough.  Has noticed some chest congestion and wheeze in the last 3 months.  Usually has 2-3 episodes of bronchitis a year, triggered by damp weather in the fall.  Does also have chest tightness when he is ill.  Strong odors like perfumes can affect his breathing.  During childhood he had bronchitis that seem to be more prevalent in his 87s but denies asthma.  Had COVID in 2021.  Only able to sing for 45 minutes when he used to be able to sing for 3 hours.  Uses albuterol  as needed 3 times a week on average.  Completed cardiac rehab summer 2023.  Underwent pulmonary function testing which was normal.  Using Breztri  intermittently; did feel like it helped his symptoms.  Has not been taking it daily.  Still has shortness of breath with  singing and activity requiring frequent breaks.  Counseled on bronchodilator use if benefit perceived.  Prefers to hold on steroid inhaler unless needed and willing to use albuterol  more frequently.  Will contact the office for follow-up as needed.  03/21/2022: SHERLEAN with Sawsan Riggio NP Patient presents today for sleep consult. He has been struggling with restless leg for many years now.  Progressively has gotten worse, despite multiple pharmacological therapies.  He is currently taking pramipexole  0.5 mg as needed. At some point, he had gone up to 5 mg a night on this and was having a lot of trouble staying awake during the day so his neurologist weaned him off of it. He had to restart at a lower dose due to the pain and discomfort he was having. They have not been able to find an underlying cause of his persistent symptoms so his neurologist suggested that he have a sleep consult for further evaluation. He did have a sleep study around 2 years ago; however, he was unable to sleep during the study and they had trouble getting accurate data due to his RLS. At the time, he was not on medication for this. He admits that he has a lot of trouble with daytime fatigue; attributes this to lack of sleep at night. Most nights he barely sleeps. If he takes his Mirapex , he can usually get 3 hours in and then wake up  and go back to sleep in the morning for an hour or so. He has fallen asleep while driving longer distances; most recent was 3 months ago. He admits to snoring at night. Has not been told that he stops breathing at night. Denies morning headaches, sleep parasomnias or paralysis. He has had normal iron  studies recently.  He goes to bed around 9 PM.  Usually falls asleep quickly if he takes his Mirapex ; otherwise he is up most the night.  Officially gets out of bed around 5 AM.  His weight is up 30 pounds in the last 2 years.  Last sleep study was 2 years ago with inadequate results; does not remember the location or name of  provider that this was performed with.   He has a history of high blood pressure, diabetes; controlled on medications.  He does have a history of TIA in the past. He is a never smoker.  Does not drink any alcohol.  Lives at home with his wife.  He works as a counsellor.  Does use power tools/woodworking machinery. Unknown family history.  His shortness of breath has improved since he was here last. No cough or chest congestion. He using the Breztri  he has occasionally but not on a weekly basis. Uses albuterol  once a week.  Epworth 15   04/30/2022: OV with Dr. Neda. Severe OSA, RLS. Rx for CPAP 5-20 cmH2O. May be difficult to get him managed on CPAP given his RLS and insomnia.   10/20/2023: OV with Dr. Jess. Never proceeded with CPAP. Witnessed apneas, snoring, excessive daytime sleepiness. Reports nocturnal awakenings due to nocturia and often has difficulty falling back to sleep. Reports a 30 lb weight gain over the last year. States that he often has difficulty falling asleep due to restless leg. Suspect OSA underlying cause of sleep maintenance insomnia. Will reassess with HST. Advised to try Metanx supplements for RLS. Encouraged to walk on treadmill before and after dinner.   02/04/2024: Today - follow up Discussed the use of AI scribe software for clinical note transcription with the patient, who gave verbal consent to proceed.  History of Present Illness Kenneth Jayne Sr. is a 73 year old male with sleep apnea and restless leg syndrome who presents for evaluation of CPAP effectiveness and restless leg symptoms.  He is wearing CPAP most nights. The machine is not fully controlling his sleep apnea with residual events. He is also having a significant amount of leaks. He sometimes struggles with the mask coming off at night. He uses a nasal mask and does not snore when the mask is on properly, but his spouse notes snoring when the mask has come off. He hasn't noticed a huge benefit from use of  CPAP yet. He's struggling a lot with his restless leg symptoms. Following with Dr. Leigh with neurology. They are trying to wean him off mirapex . He's also taking gabapentin .   He reports significant issues with restless leg syndrome, which have worsened since starting CPAP therapy. Symptoms now begin earlier in the day and sometimes affect his arms and neck. He takes gabapentin  nightly, which causes drowsiness and affects his morning functioning. His B12 levels were low, and his iron  levels are at the low end of normal. He is on supplementation for both. Parkinson's disease has been ruled out by multiple specialists.  No drowsy driving.    Allergies  Allergen Reactions   Amlodipine  Other (See Comments)    Worsening restless legs, dyspnea and leg swelling  Lyrica  [Pregabalin ] Other (See Comments)    Made patient very lethargic the next morning, very hard to patient to function, move, etc.    Other Other (See Comments)    Convulsions, Causes Restless Legs, rhabdomyolysis   Statins Other (See Comments)    Causes Restless Legs, rhabdomyolysis   Ativan  [Lorazepam ] Other (See Comments)    Markedly increased restless legs   Trulicity  [Dulaglutide ] Other (See Comments)    GI side effects     Immunization History  Administered Date(s) Administered   Pneumococcal Conjugate-13 12/01/2013   Td 03/04/1998, 03/01/2017   Tdap 03/06/2011, 02/28/2017    Past Medical History:  Diagnosis Date   Arthritis    left thumb (05/23/2017) right shoulder   Barrett's esophagus    we've been told that it's all gone; still take RX for GERD (05/23/2017)   Bilateral swelling of feet and ankles x 3 weeks as of 12-01-2019   CAD (coronary artery disease), native coronary artery    Coronary angiogram 05/03/2014:  Proximal LAD 3.0x12 mm Promus premier stent.  04/08/2014: Mid Cx 3.5 x 16 mm Promus DES.  03/29/2013: Mid LAD 2.75 x 38 mm Promus Premier drug-eluting stent, balloon angioplasty of D1 and distal LAD and  stenting of distal RCA with 2.75 x 24 mm promos Premier drug-eluting stent 12/15/2012 widely patent.   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  04/08/2014: Mid Cx 3.5 x 16 mm Promus DES.  03/29/2013: Mid LAD 2.75 x 38 mm Promus Premier drug-eluting stent, balloon angioplasty of D1 and distal LAD and stenting of distal RCA with 2.75 x 24 mm promos Premier drug-eluting stent 12/15/2012 widely patent.    Tobacco History: Social History   Tobacco Use  Smoking Status Never  Smokeless Tobacco Never   Counseling given: Not Answered   Outpatient Medications Prior to Visit  Medication Sig Dispense Refill   acetaminophen  (TYLENOL ) 500 MG tablet Take 500 mg by mouth every 6 (six) hours as needed for mild pain, moderate pain, headache or fever.     albuterol  (VENTOLIN  HFA) 108 (90 Base) MCG/ACT  inhaler TAKE 2 PUFFS BY MOUTH EVERY 6 HOURS AS  NEEDED FOR WHEEZE OR SHORTNESS OF BREATH 6.7 each 2   aspirin  EC 81 MG tablet Take 1 tablet (81 mg total) by mouth daily. Swallow whole. 90 tablet 3   BD PEN NEEDLE NANO 2ND GEN 32G X 4 MM MISC USE AS DIRECTED 100 each 3   Budeson-Glycopyrrol-Formoterol (BREZTRI  AEROSPHERE) 160-9-4.8 MCG/ACT AERO Inhale 2 puffs into the lungs in the morning and at bedtime. (Patient taking differently: Inhale 2 puffs into the lungs in the morning and at bedtime. As needed per pt.) 5.9 g 0   Continuous Blood Gluc Sensor (FREESTYLE LIBRE 14 DAY SENSOR) MISC USE TO TEST BLOOD SUGAR THREE TIMES DAILY. E11.51. E11.59 1 each 12   dapagliflozin  propanediol (FARXIGA ) 10 MG TABS tablet Take 1 tablet (10 mg total) by mouth daily before breakfast. Generic 90 tablet 3   Evolocumab  (REPATHA  SURECLICK) 140 MG/ML SOAJ Inject 140 mg into the skin every 14 (fourteen) days. 6 mL 3   fluticasone  (FLONASE ) 50 MCG/ACT nasal spray Place 1 spray into both nostrils daily as needed for allergies or rhinitis. 16 g 11   gabapentin  (NEURONTIN ) 300 MG capsule Take 2 capsules (600 mg total) by mouth 2 (two) times daily. G25.81 360 capsule 3   glucose 4 GM chewable tablet Chew 1 tablet (4 g total) by mouth as needed for low blood sugar. 50 tablet 12   insulin  aspart (NOVOLOG  FLEXPEN) 100 UNIT/ML FlexPen INJECT 15-25 UNITS INTO THE SKIN 3 (THREE) TIMES DAILY WITH MEALS. 30 mL 2   Insulin  Glargine (BASAGLAR  KWIKPEN) 100 UNIT/ML Inject 30 Units into the skin at bedtime.     L-Methylfolate-Algae-B12-B6 (METANX) 3-90.314-2-35 MG CAPS Take 1 tablet PO daily 90 capsule 2   LIFESCAN FINEPOINT LANCETS MISC Use to test for blood sugar three times daily dx: E11.51 300 each 1   metoprolol  succinate (TOPROL -XL) 25 MG 24 hr tablet TAKE 1 TABLET BY MOUTH EVERY DAY WITH OR IMMEDIATELY FOLLOWING A MEAL 90 tablet 1   nitroGLYCERIN  (NITROSTAT ) 0.4 MG SL tablet PLACE 1 TABLET UNDER THE TONGUE EVERY 5 MINUTES AS NEEDED FOR  CHEST PAIN. 50 tablet 2   ONETOUCH ULTRA test strip USE TO TEST FOR BLOOD SUGAR THREE TIMES DAILY DX: E11.51 300 strip 11   pantoprazole  (PROTONIX ) 40 MG tablet TAKE ONE TABLET BY MOUTH ONCE DAILY FOR STOMACH 90 tablet 3   pramipexole  (MIRAPEX ) 0.25 MG tablet Take 5 tablets (1.25 mg total) by mouth at bedtime for 30 days, THEN 4 tablets (1 mg total) at bedtime for 30 days, THEN 3 tablets (0.75 mg total) at bedtime for 30 days, THEN 2 tablets (0.5 mg total) at bedtime for 30 days, THEN 1 tablet (0.25 mg total) at bedtime. 450 tablet 0   tirzepatide  (MOUNJARO ) 2.5 MG/0.5ML Pen Inject 2.5 mg into the skin once a week. 6 mL 3   valsartan -hydrochlorothiazide  (DIOVAN -HCT) 160-25 MG tablet TAKE 1 TABLET BY MOUTH EVERY DAY 90 tablet 2   Facility-Administered Medications Prior to Visit  Medication Dose Route Frequency Provider Last Rate Last Admin   [COMPLETED] hydrALAZINE  (APRESOLINE ) injection 10 mg  10 mg Intravenous Once Ganji, Jay, MD   10 mg at 05/01/21 1054     Review of Systems: as above     Physical Exam:  BP 118/60   Pulse 68   Temp 97.8 F (36.6 C) (Oral)   Ht 5' 8 (1.727 m)   Wt 199 lb 12.8 oz (90.6 kg)   SpO2 94% Comment: RA  BMI 30.38 kg/m   GEN:  Pleasant, interactive, well-appearing; obese; in no acute distress HEENT:  Normocephalic and atraumatic. PERRLA. Sclera white. Nasal turbinates pink, moist and patent bilaterally. No rhinorrhea present. Oropharynx pink and moist, without exudate or edema. No lesions, ulcerations, or postnasal drip. Mallampati III NECK:  Supple w/ fair ROM. No lymphadenopathy.   CV: RRR, no m/r/g PULMONARY:  Unlabored, regular breathing. Clear bilaterally A&P w/o wheezes/rales/rhonchi. No accessory muscle use.  GI: BS present and normoactive. Soft, non-tender to palpation.  MSK: No erythema, warmth or tenderness.  Neuro: A/Ox3. No focal deficits noted.   Skin: Warm, no lesions or rashe Psych: Normal affect and behavior. Judgement and thought  content appropriate.     Lab Results:  CBC    Component Value Date/Time   WBC 6.3 09/29/2023 0849   RBC 4.98 09/29/2023 0849   HGB 14.8 09/29/2023 0849   HGB 14.5 04/25/2021 0949   HCT 44.7 09/29/2023 0849   HCT 43.1 04/25/2021 0949   PLT 162 09/29/2023 0849   PLT 137 (L) 04/25/2021 0949   MCV 89.8 09/29/2023 0849   MCV 87 04/25/2021 0949   MCH 29.7 09/29/2023 0849   MCHC 33.1 09/29/2023 0849   RDW 13.0 09/29/2023 0849   RDW 12.8 04/25/2021 0949   LYMPHSABS 1,277 07/22/2022 0903   LYMPHSABS 1.5 02/10/2015 1355   MONOABS 0.7 03/05/2022 1513   EOSABS 170 09/29/2023 0849   EOSABS 0.1 02/10/2015 1355   BASOSABS 32 09/29/2023 0849   BASOSABS 0.0 02/10/2015 1355    BMET    Component Value Date/Time   NA 138 10/14/2023 0834   NA 141 06/09/2023 1601   K 4.6 10/14/2023 0834   CL 104 10/14/2023 0834   CO2 28 10/14/2023 0834   GLUCOSE 172 (H) 10/14/2023 0834   BUN 21 10/14/2023 0834   BUN 21 06/09/2023 1601   CREATININE 1.35 (H) 10/14/2023 0834   CALCIUM  9.2 10/14/2023 0834   GFRNONAA 45 (L) 12/23/2022 2353   GFRNONAA 58 (L) 06/30/2020 0808   GFRAA 67 06/30/2020 0808    BNP    Component Value Date/Time   BNP 51.4 07/06/2021 1504   BNP 52.7 04/25/2019 0233   Iron /TIBC/Ferritin/ %Sat    Component Value Date/Time   IRON  66 01/21/2024 0949   IRON  191 (H) 10/19/2014 0813   TIBC 289 01/21/2024 0949   TIBC 262 10/19/2014 0813   FERRITIN 53 01/21/2024 0949   FERRITIN 19 (L) 04/04/2014 0831   IRONPCTSAT 23 01/21/2024 0949     Imaging:  No results found.  Administration History     None          Latest Ref Rng & Units 01/02/2022    1:11 PM  PFT Results  FVC-Pre L 3.66   FVC-Predicted Pre % 91   FVC-Post L 3.54   FVC-Predicted Post % 88   Pre FEV1/FVC % % 83   Post FEV1/FCV % % 88   FEV1-Pre L 3.03   FEV1-Predicted Pre % 103   FEV1-Post L 3.11   DLCO uncorrected ml/min/mmHg 20.61   DLCO UNC% % 85   DLCO corrected ml/min/mmHg 20.61   DLCO COR  %Predicted % 85   DLVA Predicted % 97   TLC L 5.31   TLC % Predicted % 80   RV % Predicted % 68     No results found for: NITRICOXIDE      Assessment & Plan:   Assessment & Plan Obstructive sleep apnea Severe OSA on CPAP. Good compliance. Suboptimal control with residual AHI  up to 12/h. Persistent apnea events may be due to mask leaks or insufficient pressure settings. Recommended in lab titration study; however, he declined due to significant RLS symptoms. He is tolerating CPAP relatively well. Will trial him on a set pressure of 13 cmH2O and send him for a mask fitting at [facility]. Discussed that if residual events persist, he will need to undergo in lab titration to ensure BiPAP therapy is not necessary. Reviewed risks of untreated OSA. Safe driving practices encouraged. Healthy weight management encouraged. Aware of proper care/use of device  - Set CPAP to a fixed pressure of 13 cm H2O. - Monitor response to fixed pressure setting. 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 Current treatment includes pramipexole  and gabapentin , with pramipexole  being tapered off. Gabapentin  causes drowsiness. Recommend Nidra device for moderate to severe RLS refractory to medication therapy. Orders placed today and faxed to Nidra team. Provided with RLS specialist information. Follow up with neurology as scheduled. Iron  levels normal; on supplementation.  - Will explore insurance coverage for Nidra device. - Will refer to restless leg specialist - Continue current management with Dr. Leigh     I spent 35 minutes of dedicated to the care of this patient on the date of this encounter to include pre-visit review of records, face-to-face time with the patient discussing conditions above, post visit ordering of testing, clinical  documentation with the electronic health record, making appropriate referrals as documented, and communicating necessary findings to members of the patients care team.  Kenneth LULLA Rouleau, NP 02/04/2024  Pt aware and understands NP's role.

## 2024-02-09 DIAGNOSIS — C4442 Squamous cell carcinoma of skin of scalp and neck: Secondary | ICD-10-CM | POA: Diagnosis not present

## 2024-02-12 ENCOUNTER — Encounter: Payer: Self-pay | Admitting: Cardiology

## 2024-02-13 NOTE — Telephone Encounter (Signed)
 Copied from CRM #[id number]. Topic: General - Other >> Feb 13, 2024  9:49 AM [name] wrote: Reason for CRM: Pt calling in regards to Nidra equipment. Pt stated NP Cobb sent this information to him by email however due to formatting purposes he was not able to access and open the email, pt would like to know how to proceed regarding this.  [email or web address]   Called and spoke with the pt and he stated the link would not let him paste it. I have tried the url and it works correctly. Pt is in the area and if this doesn't work he is fine with coming by office to fill out forms. I am re-sending the link to the pts Mychart account.

## 2024-02-17 ENCOUNTER — Telehealth: Payer: Self-pay | Admitting: *Deleted

## 2024-02-17 NOTE — Telephone Encounter (Signed)
 Called patient and verified appt is for mask fit only. Patient  verbalized understanding. NFN  Copied from CRM [id number]. Topic: Appointments - Appointment Info/Confirmation >> Feb 10, 2024 10:29 AM [name] wrote: Patient/patient representative is calling for information regarding an appointment.  Patient noticed hat he has an upcoming appt for a sleep study which he thought he wasn't doing, appt says 'mask fit' but instructions advise of a sleep study be performed. Please call to advise

## 2024-02-18 ENCOUNTER — Ambulatory Visit (HOSPITAL_BASED_OUTPATIENT_CLINIC_OR_DEPARTMENT_OTHER)

## 2024-02-18 DIAGNOSIS — G4733 Obstructive sleep apnea (adult) (pediatric): Secondary | ICD-10-CM

## 2024-03-09 ENCOUNTER — Other Ambulatory Visit (HOSPITAL_COMMUNITY): Payer: Self-pay

## 2024-03-09 ENCOUNTER — Encounter: Payer: Self-pay | Admitting: Internal Medicine

## 2024-03-09 ENCOUNTER — Ambulatory Visit: Admitting: Internal Medicine

## 2024-03-09 VITALS — BP 120/70 | HR 71 | Ht 68.0 in | Wt 200.4 lb

## 2024-03-09 DIAGNOSIS — Z794 Long term (current) use of insulin: Secondary | ICD-10-CM | POA: Diagnosis not present

## 2024-03-09 DIAGNOSIS — E785 Hyperlipidemia, unspecified: Secondary | ICD-10-CM

## 2024-03-09 DIAGNOSIS — E1159 Type 2 diabetes mellitus with other circulatory complications: Secondary | ICD-10-CM | POA: Diagnosis not present

## 2024-03-09 DIAGNOSIS — Z7984 Long term (current) use of oral hypoglycemic drugs: Secondary | ICD-10-CM | POA: Diagnosis not present

## 2024-03-09 DIAGNOSIS — E1165 Type 2 diabetes mellitus with hyperglycemia: Secondary | ICD-10-CM | POA: Diagnosis not present

## 2024-03-09 LAB — POCT GLYCOSYLATED HEMOGLOBIN (HGB A1C): Hemoglobin A1C: 7.6 % — AB (ref 4.0–5.6)

## 2024-03-09 MED ORDER — TIRZEPATIDE 5 MG/0.5ML ~~LOC~~ SOAJ
5.0000 mg | SUBCUTANEOUS | 3 refills | Status: AC
  Filled 2024-03-09: qty 2, 28d supply, fill #0

## 2024-03-09 NOTE — Progress Notes (Signed)
 Patient ID: Kenneth Zumbro Sr., male   DOB: 12/31/1950, 74 y.o.   MRN: [id number]  HPI: Kenneth Foots Sr. is a 74 y.o.-year-old male, returning for follow-up for DM2, dx in 2016, insulin -dependent  since 2020, uncontrolled, with complications (CAD, history of CVA, CKD stage III). Pt. previously saw Dr. Kassie, but last visit with me 3 months ago.   He is here with his wife who offers part of the history especially regarding his activity and past medical history.  Interim history: No increased urination, nausea, chest pain.  He continues to have RLS. He was recently found to have a low normal B12 and was started on B12 supplementation.- we checked his HbA1c: 7%   Reviewed HbA1c: Lab Results  Component Value Date   HGBA1C 6.9 (A) 12/03/2023   HGBA1C 7.5 (A) 09/02/2023   HGBA1C 7.5 (A) 05/20/2023   HGBA1C 9.5 (A) 04/03/2023   HGBA1C 8.4 (A) 12/06/2022   HGBA1C 9.1 (H) 07/22/2022   HGBA1C 7.8 (A) 02/28/2022   HGBA1C 8.1 (H) 10/05/2021   HGBA1C 8.3 (A) 05/07/2021   HGBA1C 7.9 (H) 01/15/2021   Previously on: - Basaglar  35 >> 30 units at bedtime - Novolog : mostly after the meal) 10-20 units before B 10-15 units before L 15-20 unit before D He stopped Ozempic  0.5 mg weekly >> nausea, could not function. He had nausea with metformin . He also had nausea with Trulicity .  Currently on: - Basaglar  36 >> 40 >> 30 units daily >> 30-50 units daily >> 36 >> 30 units daily - NovoLog  10-30 units before meals >> 10-15 unit (~40 units a day) + NovoLog  sliding scale:            150-175: +4 176-200: +6 201-250: +8 251-300: +10 301-350: +12 >350: +14 - Farxiga  10 mg before b'fast - started by Dr. Ladona 06/2023 - 300$ for 30 days >> off >> restarted - Mounjaro  2.5 mg weekly >> initially nausea, food aversion >> restarted, now tolerated well   Pt checks his sugars >4 a day with his CGM:  Previously:  Prev.:   Lowest sugar was 46 >> ...  64 >> 55 >> 64; he has hypoglycemia awareness at 70.   Highest sugar was 400 >> HI >> 300s >> 300s.  Glucometer: One Touch ultra  - + CKD, last BUN/creatinine:  Lab Results  Component Value Date   BUN 21 10/14/2023   BUN 30 (H) 09/29/2023   CREATININE 1.35 (H) 10/14/2023   CREATININE 1.50 (H) 09/29/2023   Lab Results  Component Value Date   MICRALBCREAT 9 05/20/2023   MICRALBCREAT 12 07/22/2022   MICRALBCREAT 20 03/06/2020   MICRALBCREAT SEE NOTE 08/27/2016   MICRALBCREAT <10.2 08/26/2013  On Diovan  80 mg daily.  -+HL; last set of lipids: Lab Results  Component Value Date   CHOL 102 06/09/2023   HDL 43 06/09/2023   LDLCALC 38 06/09/2023   LDLDIRECT 110 (H) 06/17/2019   TRIG 114 06/09/2023   CHOLHDL 2.4 06/09/2023  He is on Praluent .  He had side effects from statins in the past.  - last eye exam was 2025. No DR reportedly.   - +  restless leg syndrome.  He is on Neurontin . Also, on pramipexole  for restless leg syndrome.  He sees neurology.  Last foot exam 12/03/2023.  He also has a history of HTN, GERD, Barrett's esophagus, iron  deficiency anemia, memory loss.  ROS: + see HPI  Past Medical History:  Diagnosis Date   Arthritis    left  thumb (05/23/2017) right shoulder   Barrett's esophagus    we've been told that it's all gone; still take RX for GERD (05/23/2017)   Bilateral swelling of feet and ankles x 3 weeks as of 12-01-2019   CAD (coronary artery disease), native coronary artery    Coronary angiogram 05/03/2014:  Proximal LAD 3.0x12 mm Promus premier stent.  04/08/2014: Mid Cx 3.5 x 16 mm Promus DES.  03/29/2013: Mid LAD 2.75 x 38 mm Promus Premier drug-eluting stent, balloon angioplasty of D1 and distal LAD and stenting of distal RCA with 2.75 x 24 mm promos Premier drug-eluting stent 12/15/2012 widely patent.   Cervicalgia    Chronic bronchitis (HCC)    q yr (05/23/2017)   Complication of anesthesia     I do not wake up very well   slow to wake up   COVID-19 05/2019   all covid symptoms in hospital x 5 days,  all symptoms resolved took monoclonal antibody tx    Dyspnea    with exertion   Elevated LFTs 02/06/2021   Family history of adverse reaction to anesthesia    son w/PONV   GERD (gastroesophageal reflux disease)    Heart murmur    grew out of it    Hyperlipidemia    Hypertension    Hypoglycemia, unspecified    IDDM (insulin  dependent diabetes mellitus)    type 2   Impotence of organic origin    Iron  deficiency anemia    Memory loss    mild   Other malaise and fatigue    Pneumonia 05/2019   PONV (postoperative nausea and vomiting)    after wisdom teeth pulled, no problems with other surgeries   Restless leg    Rotator cuff arthropathy    Stroke (HCC)    Tension headache    sometimes (05/23/2017)   TIA (transient ischemic attack) 01/14/2021   Unspecified gastritis and gastroduodenitis with hemorrhage    Unstable angina pectoris (HCC) 04/08/2014   Coronary angiogram 05/03/2014:  Proximal LAD 3.0x12 mm Promus premier stent.  04/08/2014: Mid Cx 3.5 x 16 mm Promus DES.  03/29/2013: Mid LAD 2.75 x 38 mm Promus Premier drug-eluting stent, balloon angioplasty of D1 and distal LAD and stenting of distal RCA with 2.75 x 24 mm promos Premier drug-eluting stent 12/15/2012 widely patent.   Past Surgical History:  Procedure Laterality Date   BACK SURGERY Bilateral 05/28/2023   BICEPT TENODESIS Right 12/07/2019   Procedure: RIGHT BICEPS TENODESIS;  Surgeon: Beverley Evalene BIRCH, MD;  Location: [facility] [place];  Service: Orthopedics;  Laterality: Right;   CARDIAC CATHETERIZATION N/A 09/25/2015   Procedure: Left Heart Cath and Coronary Angiography;  Surgeon: Gordy Bergamo, MD;  Location: [facility] INVASIVE CV LAB;  Service: Cardiovascular;  Laterality: N/A;   CARDIAC CATHETERIZATION N/A 09/25/2015   Procedure: Intravascular Pressure Wire/FFR Study;  Surgeon: Gordy Bergamo, MD;  Location: [facility] INVASIVE CV LAB;  Service: Cardiovascular;  Laterality: N/A;   CORONARY ANGIOPLASTY WITH STENT PLACEMENT   12/15/2012; 04/28/2014; 05/03/2014   2; 1; 1 , 4 stents 2 balloons   CORONARY STENT INTERVENTION N/A 05/01/2021   Procedure: CORONARY STENT INTERVENTION;  Surgeon: Bergamo Gordy, MD;  Location: [facility] INVASIVE CV LAB;  Service: Cardiovascular;  Laterality: N/A;   FRACTIONAL FLOW RESERVE WIRE Right 03/26/2013   Procedure: FRACTIONAL FLOW RESERVE WIRE;  Surgeon: Erick JONELLE Bergamo, MD;  Location: [facility] CATH LAB;  Service: Cardiovascular;  Laterality: Right;   FRACTIONAL FLOW RESERVE WIRE N/A 05/03/2014   Procedure: FRACTIONAL FLOW  RESERVE WIRE;  Surgeon: Erick JONELLE Bergamo, MD;  Location: [facility] CATH LAB;  Service: Cardiovascular;  Laterality: N/A;   HERNIA REPAIR  2010   umbilical    INCISION AND DRAINAGE ABSCESS Left 05/2007   groin    KNEE SURGERY Right 1992;  2000   calcium  deposits removed (12/15/2012)   LEFT HEART CATH AND CORONARY ANGIOGRAPHY N/A 05/23/2017   Procedure: LEFT HEART CATH AND CORONARY ANGIOGRAPHY;  Surgeon: Bergamo Heinz, MD;  Location: [facility] INVASIVE CV LAB;  Service: Cardiovascular;  Laterality: N/A;   LEFT HEART CATH AND CORONARY ANGIOGRAPHY N/A 05/01/2021   Procedure: LEFT HEART CATH AND CORONARY ANGIOGRAPHY;  Surgeon: Bergamo Heinz, MD;  Location: [facility] INVASIVE CV LAB;  Service: Cardiovascular;  Laterality: N/A;   LEFT HEART CATHETERIZATION WITH CORONARY ANGIOGRAM N/A 12/15/2012   Procedure: LEFT HEART CATHETERIZATION WITH CORONARY ANGIOGRAM;  Surgeon: Erick JONELLE Bergamo, MD;  Location: [facility] CATH LAB;  Service: Cardiovascular;  Laterality: N/A;   LEFT HEART CATHETERIZATION WITH CORONARY ANGIOGRAM N/A 03/26/2013   Procedure: LEFT HEART CATHETERIZATION WITH CORONARY ANGIOGRAM;  Surgeon: Erick JONELLE Bergamo, MD;  Location: [facility] CATH LAB;  Service: Cardiovascular;  Laterality: N/A;   LEFT HEART CATHETERIZATION WITH CORONARY ANGIOGRAM N/A 04/08/2014   Procedure: LEFT HEART CATHETERIZATION WITH CORONARY ANGIOGRAM;  Surgeon: Ozell JONETTA Fell, MD;  Location: [facility] CATH LAB;  Service: Cardiovascular;   Laterality: N/A;   MOUTH SURGERY Right    PERCUTANEOUS CORONARY STENT INTERVENTION (PCI-S) N/A 05/03/2014   Procedure: PERCUTANEOUS CORONARY STENT INTERVENTION (PCI-S);  Surgeon: Erick JONELLE Bergamo, MD;  Location: [facility] CATH LAB;  Service: Cardiovascular;  Laterality: N/A;   RADIOLOGY WITH ANESTHESIA N/A 02/08/2021   Procedure: MRI WITH ANESTHESIA OF ABDOMEN WITH AND WITHOUT CONSTRAST;  Surgeon: Radiologist, Medication, MD;  Location: [facility] OR;  Service: Radiology;  Laterality: N/A;   ROTATOR CUFF REPAIR  2022   UMBILICAL HERNIA REPAIR  yrs ago   Social History   Socioeconomic History   Marital status: Married    Spouse name: Pam   Number of children: 2   Years of education: 12   Highest education level: High school graduate  Occupational History   Occupation: Naval Architect    Comment: deliveries only now   Occupation: woodworking  Tobacco Use   Smoking status: Never   Smokeless tobacco: Never  Vaping Use   Vaping status: Never Used  Substance and Sexual Activity   Alcohol use: No   Drug use: No   Sexual activity: Not Currently  Other Topics Concern   Not on file  Social History Narrative   Right handed    Caffeine 2 cups daily   Winter time 4 cups daily   Lives family one level home    Semi Retired   Social Drivers of Health   Tobacco Use: Low Risk (02/04/2024)   Patient History    Smoking Tobacco Use: Never    Smokeless Tobacco Use: Never    Passive Exposure: Not on file  Financial Resource Strain: Not on file  Food Insecurity: Not on file  Transportation Needs: Not on file  Physical Activity: Not on file  Stress: Not on file  Social Connections: Not on file  Intimate Partner Violence: Not on file  Depression (EYV7-0): Low Risk (10/31/2023)   Depression (PHQ2-9)    PHQ-2 Score: 0  Alcohol Screen: Not on file  Housing: Not on file  Utilities: Not on file  Health Literacy: Not on file   Current Outpatient Medications on File Prior to Visit  Medication Sig Dispense  Refill   acetaminophen  (TYLENOL ) 500 MG tablet Take 500 mg by mouth every 6 (six) hours as needed for mild pain, moderate pain, headache or fever.     albuterol  (VENTOLIN  HFA) 108 (90 Base) MCG/ACT inhaler TAKE 2 PUFFS BY MOUTH EVERY 6 HOURS AS NEEDED FOR WHEEZE OR SHORTNESS OF BREATH 6.7 each 2   aspirin  EC 81 MG tablet Take 1 tablet (81 mg total) by mouth daily. Swallow whole. 90 tablet 3   BD PEN NEEDLE NANO 2ND GEN 32G X 4 MM MISC USE AS DIRECTED 100 each 3   Budeson-Glycopyrrol-Formoterol (BREZTRI  AEROSPHERE) 160-9-4.8 MCG/ACT AERO Inhale 2 puffs into the lungs in the morning and at bedtime. (Patient taking differently: Inhale 2 puffs into the lungs in the morning and at bedtime. As needed per pt.) 5.9 g 0   Continuous Blood Gluc Sensor (FREESTYLE LIBRE 14 DAY SENSOR) MISC USE TO TEST BLOOD SUGAR THREE TIMES DAILY. E11.51. E11.59 1 each 12   dapagliflozin  propanediol (FARXIGA ) 10 MG TABS tablet Take 1 tablet (10 mg total) by mouth daily before breakfast. Generic 90 tablet 3   Evolocumab  (REPATHA  SURECLICK) 140 MG/ML SOAJ Inject 140 mg into the skin every 14 (fourteen) days. 6 mL 3   fluticasone  (FLONASE ) 50 MCG/ACT nasal spray Place 1 spray into both nostrils daily as needed for allergies or rhinitis. 16 g 11   gabapentin  (NEURONTIN ) 300 MG capsule Take 2 capsules (600 mg total) by mouth 2 (two) times daily. G25.81 360 capsule 3   glucose 4 GM chewable tablet Chew 1 tablet (4 g total) by mouth as needed for low blood sugar. 50 tablet 12   insulin  aspart (NOVOLOG  FLEXPEN) 100 UNIT/ML FlexPen INJECT 15-25 UNITS INTO THE SKIN 3 (THREE) TIMES DAILY WITH MEALS. 30 mL 2   Insulin  Glargine (BASAGLAR  KWIKPEN) 100 UNIT/ML Inject 30 Units into the skin at bedtime.     L-Methylfolate-Algae-B12-B6 (METANX) 3-90.314-2-35 MG CAPS Take 1 tablet PO daily 90 capsule 2   LIFESCAN FINEPOINT LANCETS MISC Use to test for blood sugar three times daily dx: E11.51 300 each 1   metoprolol  succinate (TOPROL -XL) 25 MG 24  hr tablet TAKE 1 TABLET BY MOUTH EVERY DAY WITH OR IMMEDIATELY FOLLOWING A MEAL 90 tablet 1   nitroGLYCERIN  (NITROSTAT ) 0.4 MG SL tablet PLACE 1 TABLET UNDER THE TONGUE EVERY 5 MINUTES AS NEEDED FOR CHEST PAIN. 50 tablet 2   ONETOUCH ULTRA test strip USE TO TEST FOR BLOOD SUGAR THREE TIMES DAILY DX: E11.51 300 strip 11   pantoprazole  (PROTONIX ) 40 MG tablet TAKE ONE TABLET BY MOUTH ONCE DAILY FOR STOMACH 90 tablet 3   pramipexole  (MIRAPEX ) 0.25 MG tablet Take 5 tablets (1.25 mg total) by mouth at bedtime for 30 days, THEN 4 tablets (1 mg total) at bedtime for 30 days, THEN 3 tablets (0.75 mg total) at bedtime for 30 days, THEN 2 tablets (0.5 mg total) at bedtime for 30 days, THEN 1 tablet (0.25 mg total) at bedtime. 450 tablet 0   tirzepatide  (MOUNJARO ) 2.5 MG/0.5ML Pen Inject 2.5 mg into the skin once a week. 6 mL 3   valsartan -hydrochlorothiazide  (DIOVAN -HCT) 160-25 MG tablet TAKE 1 TABLET BY MOUTH EVERY DAY 90 tablet 2   Current Facility-Administered Medications on File Prior to Visit  Medication Dose Route Frequency Provider Last Rate Last Admin   [COMPLETED] hydrALAZINE  (APRESOLINE ) injection 10 mg  10 mg Intravenous Once Ganji, Jay, MD   10 mg at 05/01/21 1054   Allergies  Allergen Reactions   Amlodipine  Other (See Comments)    Worsening restless legs, dyspnea and leg swelling   Lyrica  [Pregabalin ] Other (See Comments)    Made patient very lethargic the next morning, very hard to patient to function, move, etc.    Other Other (See Comments)    Convulsions, Causes Restless Legs, rhabdomyolysis   Statins Other (See Comments)    Causes Restless Legs, rhabdomyolysis   Ativan  [Lorazepam ] Other (See Comments)    Markedly increased restless legs   Trulicity  [Dulaglutide ] Other (See Comments)    GI side effects    Family History  Adopted: Yes  Problem Relation Age of Onset   Emphysema Mother    PE: There were no vitals taken for this visit. Wt Readings from Last 10 Encounters:   02/04/24 199 lb 12.8 oz (90.6 kg)  01/21/24 199 lb (90.3 kg)  01/15/24 198 lb (89.8 kg)  12/03/23 200 lb (90.7 kg)  10/31/23 199 lb 9.6 oz (90.5 kg)  10/20/23 205 lb 12.8 oz (93.4 kg)  09/29/23 206 lb 12.8 oz (93.8 kg)  09/12/23 210 lb (95.3 kg)  09/02/23 212 lb (96.2 kg)  08/01/23 207 lb (93.9 kg)   Constitutional: overweight, in NAD Eyes: no exophthalmos ENT: no thyromegaly, no cervical lymphadenopathy Cardiovascular: RRR, No MRG Respiratory: CTA B Musculoskeletal: no deformities Skin:  no rashes Neurological: no tremor with outstretched hands  ASSESSMENT: 1. DM2, insulin -dependent, uncontrolled, with complications - CAD - h/o CVA - CKD stage III - severe hypoglycemia in 2019  2. HL  PLAN:  1. Patient with longstanding, uncontrolled, type 2 diabetes, on basal/bolus insulin  regimen along with SGLT2 inhibitor and GLP-1/GIP receptor agonist, with improving HbA1c.  At last visit, HbA1c was 6.9%, lower.  At that time, sugars appears to be mostly within the target range, with hyperglycemic exceptions and occasional hypoglycemia down to the 50s.  Upon questioning, he was varying the dose of Basaglar  between 30 and 50 units based on the sugars and we discussed about staying with a stable dose, 36 units daily.  We also discussed that if he had to take his NovoLog  after the meal in case he forgot to take it before the meal, to only take 50% of the dose to avoid postprandial hypoglycemia.  We also discussed about increasing the dose of Mounjaro  as he was tolerating it well.  However, she did not do this since last visit as he forgot. - Of note, he had nausea with GLP-1 receptor agonist (Ozempic  and Trulicity ). CGM interpretation: -At today's visit, we reviewed his CGM downloads: It appears that 65% of values are in target range (goal >70%), while 35% are higher than 180 (goal <25%), and 2% are lower than 70 (goal <4%).  The calculated average blood sugar is 157.  The projected HbA1c for the  next 3 months (GMI) is 7.1%. -Reviewing the CGM trends, sugars appear to be quite fluctuating, with hyperglycemic spikes after meals (per wife, due to taking his insulin  too late after the meal, and occasional drops, particularly after dinner and overnight.  Reviewing the pattern of his low blood sugars, these do not appear to be related to an excessive basal insulin  dose but rather overcorrection of highs (80 higher amount of insulin  for correction than recommended).  At today's visit I gave him a more relaxed sliding scale and advised him to only use the sliding scale if he forgets to bolus NovoLog  before meals.  Also, I advised him not to bolus in the middle of  the night to avoid lows overnight.  Will also increase his Mounjaro  dose to help with hyperglycemic spikes after meals.  I advised him to let me know if he tolerates well this dose, so we can increase the dose further if needed. - I suggested to:  Patient Instructions  Please continue: - Farxiga  10 mg before b'fast - Basaglar  30 units daily - NovoLog  10-15 units before meals  Change: + NovoLog  sliding scale:            150-175: +3 176-200: +4 201-250: +5 251-300: +6 301-350: +7 >350: +8 If you have to take the insulin  after a meal, take only correction. No NovoLog  at bedtime or overnight.  Increase: - Mounjaro  5 mg weekly - let me know if we can increase the dose further  Please return in 3-4 months.  - we checked his HbA1c: 7.6% (higher) - advised to check sugars at different times of the day - 4x a day, rotating check times - advised for yearly eye exams >> he is UTD - return to clinic in 3-4 months  2. HL - Latest lipid fractions were reviewed from 06/2023: All fractions at goal: Lab Results  Component Value Date   CHOL 102 06/09/2023   HDL 43 06/09/2023   LDLCALC 38 06/09/2023   LDLDIRECT 110 (H) 06/17/2019   TRIG 114 06/09/2023   CHOLHDL 2.4 06/09/2023  - He had restless legs and rhabdomyolysis from statins in  the past.  He is on Repatha  now, without side effects.  Lela Fendt, MD PhD [facility] Endocrinology

## 2024-03-09 NOTE — Patient Instructions (Addendum)
 Please continue: - Farxiga  10 mg before b'fast - Basaglar  30 units daily - NovoLog  10-15 units before meals  Change: + NovoLog  sliding scale:            150-175: +3 176-200: +4 201-250: +5 251-300: +6 301-350: +7 >350: +8 If you have to take the insulin  after a meal, take only correction. No NovoLog  at bedtime or overnight.  Increase: - Mounjaro  5 mg weekly - let me know if we can increase the dose further  Please return in 3-4 months.

## 2024-03-11 ENCOUNTER — Encounter: Admitting: Dietician

## 2024-03-15 ENCOUNTER — Other Ambulatory Visit (INDEPENDENT_AMBULATORY_CARE_PROVIDER_SITE_OTHER)

## 2024-03-15 ENCOUNTER — Ambulatory Visit: Admitting: Pulmonary Disease

## 2024-03-15 ENCOUNTER — Encounter: Payer: Self-pay | Admitting: Pulmonary Disease

## 2024-03-15 ENCOUNTER — Telehealth (HOSPITAL_COMMUNITY): Payer: Self-pay | Admitting: Pulmonary Disease

## 2024-03-15 ENCOUNTER — Other Ambulatory Visit: Payer: Self-pay | Admitting: Pulmonary Disease

## 2024-03-15 ENCOUNTER — Telehealth (HOSPITAL_COMMUNITY): Payer: Self-pay | Admitting: Pharmacy Technician

## 2024-03-15 VITALS — BP 154/80 | HR 60 | Ht 68.0 in | Wt 203.0 lb

## 2024-03-15 DIAGNOSIS — T44995A Adverse effect of other drug primarily affecting the autonomic nervous system, initial encounter: Secondary | ICD-10-CM | POA: Diagnosis not present

## 2024-03-15 DIAGNOSIS — G4733 Obstructive sleep apnea (adult) (pediatric): Secondary | ICD-10-CM | POA: Diagnosis not present

## 2024-03-15 DIAGNOSIS — N1831 Chronic kidney disease, stage 3a: Secondary | ICD-10-CM

## 2024-03-15 DIAGNOSIS — G2581 Restless legs syndrome: Secondary | ICD-10-CM | POA: Diagnosis not present

## 2024-03-15 LAB — BASIC METABOLIC PANEL WITH GFR
BUN: 24 mg/dL — ABNORMAL HIGH (ref 6–23)
CO2: 29 meq/L (ref 19–32)
Calcium: 9.3 mg/dL (ref 8.4–10.5)
Chloride: 103 meq/L (ref 96–112)
Creatinine, Ser: 1.32 mg/dL (ref 0.40–1.50)
GFR: 53.11 mL/min — ABNORMAL LOW
Glucose, Bld: 75 mg/dL (ref 70–99)
Potassium: 4 meq/L (ref 3.5–5.1)
Sodium: 140 meq/L (ref 135–145)

## 2024-03-15 MED ORDER — GABAPENTIN ENACARBIL ER 600 MG PO TBCR: 600.0000 mg | EXTENDED_RELEASE_TABLET | Freq: Every day | ORAL | 1 refills | Status: AC

## 2024-03-15 NOTE — Telephone Encounter (Signed)
 Auth Submission: NO AUTH NEEDED Site of care: [facility] Payer: MEDICARE A/B, BCBS SUPP Medication & CPT/J Code(s) submitted: Monoferric  (Ferric derisomaltose ) J1437 Diagnosis Code: D50.9 Route of submission (phone, fax, portal):  Phone # Fax # Auth type: Buy/Bill HB Units/visits requested: 1000mg  x 1 dose Reference number:  Approval from: 03/15/2024 to 06/13/24    Dagoberto Armour, CPhT [facility] Phone: [phone] 03/15/2024

## 2024-03-15 NOTE — Telephone Encounter (Signed)
 Patient referred to infusion pharmacy team for ambulatory infusion of IV iron .  Insurance - Medicare Part A/B, BCBS supplement  Site of care - Site of care: [facility] Dx code - D50.9 IV Iron  Therapy - Monoferric  1000 mg x 1 Infusion appointments - Scheduling team will schedule patient as soon as possible.   Thank you,  Norton Blush, PharmD, [facility] Pharmacist [facility]

## 2024-03-15 NOTE — Patient Instructions (Addendum)
 Take 600 mg gabapentin  every day at about 1-2 pm. And, take 1200 mg gabapentin  every night between 6-7 pm.  I will work on getting a different type of gabapentin  for you which may be more effective.  ----------   Please have your blood work / labs drawn today (or at your earliest possible convenience). You will receive a call from our office with the results and any next steps.  ----------   Please schedule a follow up appointment with me when you check out today.  ----------   You will receive a call to schedule an iron  infusion in the next few days.  ----------  Please call your DME company ([employer]) and request a P-10 nasal pillows mask and a chin-strap. A good chin strap if you are able to get it is the [employer] SleepTight Chinstrap.

## 2024-03-15 NOTE — Progress Notes (Addendum)
 "  Established Patient Pulmonology Office Visit   Subjective:  Patient ID: Kenneth Zane., male    DOB: 07-Sep-1950   MRN: [id number]  CC:  Chief Complaint  Patient presents with   Obstructive Sleep Apnea    Discuss mask fit.8    Kenneth Mcclintock Sr. is a 74 y.o. never-smoker with severe OSA, T2DM, CAD, HTN, hx of CVA, GERD, mild PAH, ? CKD vs recent AKI, and RLS who presents for follow up. I have not previously met this patient but he has been seen by my colleague Comer Rouleau, NP.  The patient's main complaint today is his severe RLS symptoms. He appears to be suffering from augmentation due to doses of pramipexole  that escalated over a number of years up to a peak of 5 mg at bedtime. His neurologist and Comer Rouleau have both recommended significant reductions in Mirapex  dosing which is appropriate for managing augmentation. The patient is trying his best to adhere to this recommendation. He is mostly taking 0.25 - 0.5 mg at bedtime currently, though occasionally he states he has to take up to 1.25 mg of Mirapex  to obtain sufficient symptom relief to be able to sleep.  He is also taking gabapentin  (albeit not gabapentin  enacarbil, and there may be some night-to-night variation in how frequently or much he takes). In general he takes 1200 mg at bedtime. Some nights he takes 1200 mg, then about 3-4 hours later another 600 mg for total 1800 mg. His eGFR recently in the 50s in 09/2023-10/2023 though this may be more of an AKI than CKD.  His iron  saturation is 23% but ferritin only 53 as of 01/2024. He has not had an iron  infusion.  He has been ordered for the Nidra peroneal nerve stimulator but not yet obtained this. He is expecting to get this in the next few weeks.  OSA: His leak is excessive leading to suboptimal treatment outcome. Residual AHI elevated.  CPAP Data: - Usage days >\= 4h: 63% - Median usage (days used): 4 hrs 56 mins - Pressure: Median 10.1, 95% 14.6 - Leak: Median 7.2, 95%  52.8 LPM - Device-estimated AHI: 10.7     Pulm Questionnaires:     10/20/2023    8:00 AM 03/21/2022    9:00 AM  Results of the Epworth flowsheet  Sitting and reading 1 3  Watching TV 1 1  Sitting, inactive in a public place (e.g. a theatre or a meeting) 2 2  As a passenger in a car for an hour without a break 1 3  Lying down to rest in the afternoon when circumstances permit 0 0  Sitting and talking to someone 0 2  Sitting quietly after a lunch without alcohol 0 3  In a car, while stopped for a few minutes in traffic 0 1  Total score 5 15     ROS  Current Medications[1]      Objective:  BP (!) 154/80   Pulse 60   Ht 5' 8 (1.727 m)   Wt 203 lb (92.1 kg)   SpO2 96% Comment: ra  BMI 30.87 kg/m    Physical Exam Vitals reviewed.  Constitutional:      Appearance: Normal appearance. He is obese.  HENT:     Head: Normocephalic and atraumatic.  Eyes:     General: No scleral icterus.       Right eye: No discharge.        Left eye: No discharge.     Conjunctiva/sclera: Conjunctivae  normal.  Pulmonary:     Effort: Pulmonary effort is normal.  Neurological:     Mental Status: He is alert and oriented to person, place, and time. Mental status is at baseline.     Comments: Near constant movement of his lower extremities which is overtly bothersome to the patient.  Psychiatric:        Behavior: Behavior normal.        Thought Content: Thought content normal.        Judgment: Judgment normal.      Diagnostic Review:    Serum HCO3: CO2  Date/Time Value Ref Range Status  10/14/2023 08:34 AM 28 20 - 32 mmol/L Final  09/29/2023 08:49 AM 31 20 - 32 mmol/L Final  06/09/2023 04:01 PM 27 20 - 29 mmol/L Final    Hemoglobin A1c:    Component Value Date/Time   HGBA1C 7.6 (A) 03/09/2024 0919   HGBA1C 6.9 (A) 12/03/2023 0920   HGBA1C 7.5 (A) 09/02/2023 1513   HGBA1C 9.1 (H) 07/22/2022 0903   HGBA1C 8.1 (H) 10/05/2021 0859   HGBA1C 7.9 (H) 01/15/2021 0811   HGBA1C  7.6 04/10/2016 0000    TSH: TSH  Date/Time Value Ref Range Status  01/15/2021 08:11 AM 2.684 0.350 - 4.500 uIU/mL Final    Comment:    Performed by a 3rd Generation assay with a functional sensitivity of <=0.01 uIU/mL. Performed at [facility], [address] [place], [place] [address]   10/09/2015 08:34 AM 2.65 0.40 - 4.50 mIU/L Final  09/19/2013 04:11 AM 4.810 (H) 0.350 - 4.500 uIU/mL Final    Iron  Studies: Iron   Date/Time Value Ref Range Status  01/21/2024 09:49 AM 66 50 - 180 mcg/dL Final  88/72/7975 89:74 AM 119 50 - 180 mcg/dL Final  94/79/7975 90:96 AM 104 50 - 180 mcg/dL Final  91/82/7983 91:86 AM 191 (H) 38 - 169 ug/dL Final  94/98/7983 91:72 AM 32 (L) 38 - 169 ug/dL Final   TIBC  Date/Time Value Ref Range Status  01/21/2024 09:49 AM 289 250 - 425 mcg/dL (calc) Final  88/72/7975 10:25 AM 281 250 - 425 mcg/dL (calc) Final  94/79/7975 09:03 AM 269 250 - 425 mcg/dL (calc) Final   Total Iron  Binding Capacity  Date/Time Value Ref Range Status  10/19/2014 08:13 AM 262 250 - 450 ug/dL Final   %SAT  Date/Time Value Ref Range Status  01/21/2024 09:49 AM 23 20 - 48 % (calc) Final  01/29/2023 10:25 AM 42 20 - 48 % (calc) Final  07/22/2022 09:03 AM 39 20 - 48 % (calc) Final   Ferritin  Date/Time Value Ref Range Status  01/21/2024 09:49 AM 53 24 - 380 ng/mL Final  01/29/2023 10:25 AM 235 24 - 380 ng/mL Final  07/22/2022 09:03 AM 173 24 - 380 ng/mL Final  04/04/2014 08:31 AM 19 (L) 30 - 400 ng/mL Final  11/29/2013 08:22 AM 26 (L) 30 - 400 ng/mL Final  09/22/2013 01:30 PM 32 30 - 400 ng/mL Final    ABG:    Component Value Date/Time   TCO2 24 01/14/2021 1152     VBG: No results found for: PHVEN, PCO2VEN, PO2VEN      Assessment & Plan:   Assessment & Plan RLS (restless legs syndrome) He has overt evidence of augmentation of RLS symptoms secondary to dopamine receptor agonist medication (Mirapex  / pramipexole ). Dramatic dose reduction of Mirapex   already undertaken and patient trying very hard to adhere to this but his symptoms are severe. Needs  adjunctive treatment to enable him to discontinue dopamine receptor agonist entirely.  Rx: IV iron  infusion (counseled the patient about small risk of infusion reaction); gabapentin  600 mg at 1-2 pm, followed by 1,200 mg 2 hours before bed; obtain Nidra peroneal nerve stimulator and use consistently; no change to Mirapex  for now (continue trying to limit use to 0.25 mg or as close as possible to this due to overt evidence of augmentation) but goal will be to try to discontinue this as soon as possible; repeat BMP today to re-evaluate CrCl / eGFR and determine maximum gabapentin  enacarbil dosing for his degree of renal function -- switching to gabapentin  enacarbil will help to prolong the duration of effect (he does notice the effect of gabapentin  wears off after a few hours of sleep).  We briefly discussed the use of low-dose low-potency opioid medications in cases of refractory RLS. They and I both have concerns about the risks of dependence. We agreed to reserve this as an absolute last resort (which should always the case regardless). Orders:   Amb Referral to Intravenous Iron  Therapy   Gabapentin  Enacarbil 600 MG TBCR; Take 1 tablet (600 mg total) by mouth daily. Take at 5 pm each day. Do NOT combine with other forms of gabapentin . Do NOT take more than once daily.  Adverse effect of dopamine receptor agonist See RLS section above regarding mirapex  -> severe augmentation of RLS symptoms. Orders:   Amb Referral to Intravenous Iron  Therapy  Stage 3a chronic kidney disease (HCC)  Orders:   Basic Metabolic Panel (BMET); Future  OSA (obstructive sleep apnea) CPAP device does not appear to have been changed to 13 cm H2O (fixed) as was intended at last visit. Currently set to 4-16 cm H2O.  Leak is excessive. Switch to P-10 nasal pillows mask + chin strap.  Review residual AHI and P95 at follow up  to determine appropriate pressure change at follow up.  Orders:   Ambulatory Referral for DME    Return in about 2 months (around 05/13/2024).   Time spent on day of this encounter (includes time spent face-to-face with the patient as well as time spent the same day as the encounter reviewing existing data and notes, and/or documenting my findings and the plan of care): 65 minutes  This visit was prolonged due to the severity of the patient's symptoms / side effect of existing therapy for his RLS and due to transition of care to a new provider.  Lamar JINNY Dales, MD   ADDENDUM 03/15/2024 12:12 PM  - BMP shows Cr 1.32 with eGFR of 53 (GFR was 55 and 49 in 10/2023 and 09/2023 respectively -- stable). Will treat this as his present baseline renal function. - Ordered gabapentin  enacarbil 600 mg to be taken at 5 PM (earlier than current dosing to account for slower onset of action) daily. No split dosing due to change in pharmacokinetics vs regular gabapentin . - Communicated this to the patient by phone. He was made aware of change in timing of dose and that he is NOT to take regular gabapentin  in combination with gabapentin  enacarbil, however I will leave the other order for gabapentin  on his medication list at this time pending insurance approval of the gabapentin  enacarbil.     [1]  Current Outpatient Medications:    acetaminophen  (TYLENOL ) 500 MG tablet, Take 500 mg by mouth every 6 (six) hours as needed for mild pain, moderate pain, headache or fever., Disp: , Rfl:    albuterol  (VENTOLIN  HFA)  108 (90 Base) MCG/ACT inhaler, TAKE 2 PUFFS BY MOUTH EVERY 6 HOURS AS NEEDED FOR WHEEZE OR SHORTNESS OF BREATH, Disp: 6.7 each, Rfl: 2   aspirin  EC 81 MG tablet, Take 1 tablet (81 mg total) by mouth daily. Swallow whole., Disp: 90 tablet, Rfl: 3   BD PEN NEEDLE NANO 2ND GEN 32G X 4 MM MISC, USE AS DIRECTED, Disp: 100 each, Rfl: 3   Budeson-Glycopyrrol-Formoterol (BREZTRI  AEROSPHERE) 160-9-4.8 MCG/ACT  AERO, Inhale 2 puffs into the lungs in the morning and at bedtime. (Patient taking differently: Inhale 2 puffs into the lungs in the morning and at bedtime. As needed per pt.), Disp: 5.9 g, Rfl: 0   Continuous Blood Gluc Sensor (FREESTYLE LIBRE 14 DAY SENSOR) MISC, USE TO TEST BLOOD SUGAR THREE TIMES DAILY. E11.51. E11.59, Disp: 1 each, Rfl: 12   dapagliflozin  propanediol (FARXIGA ) 10 MG TABS tablet, Take 1 tablet (10 mg total) by mouth daily before breakfast. Generic, Disp: 90 tablet, Rfl: 3   Evolocumab  (REPATHA  SURECLICK) 140 MG/ML SOAJ, Inject 140 mg into the skin every 14 (fourteen) days., Disp: 6 mL, Rfl: 3   fluticasone  (FLONASE ) 50 MCG/ACT nasal spray, Place 1 spray into both nostrils daily as needed for allergies or rhinitis., Disp: 16 g, Rfl: 11   gabapentin  (NEURONTIN ) 300 MG capsule, Take 2 capsules (600 mg total) by mouth 2 (two) times daily. G25.81, Disp: 360 capsule, Rfl: 3   glucose 4 GM chewable tablet, Chew 1 tablet (4 g total) by mouth as needed for low blood sugar., Disp: 50 tablet, Rfl: 12   insulin  aspart (NOVOLOG  FLEXPEN) 100 UNIT/ML FlexPen, INJECT 15-25 UNITS INTO THE SKIN 3 (THREE) TIMES DAILY WITH MEALS., Disp: 30 mL, Rfl: 2   Insulin  Glargine (BASAGLAR  KWIKPEN) 100 UNIT/ML, Inject 30 Units into the skin at bedtime., Disp: , Rfl:    L-Methylfolate-Algae-B12-B6 (METANX) 3-90.314-2-35 MG CAPS, Take 1 tablet PO daily, Disp: 90 capsule, Rfl: 2   LIFESCAN FINEPOINT LANCETS MISC, Use to test for blood sugar three times daily dx: E11.51, Disp: 300 each, Rfl: 1   metoprolol  succinate (TOPROL -XL) 25 MG 24 hr tablet, TAKE 1 TABLET BY MOUTH EVERY DAY WITH OR IMMEDIATELY FOLLOWING A MEAL, Disp: 90 tablet, Rfl: 1   nitroGLYCERIN  (NITROSTAT ) 0.4 MG SL tablet, PLACE 1 TABLET UNDER THE TONGUE EVERY 5 MINUTES AS NEEDED FOR CHEST PAIN., Disp: 50 tablet, Rfl: 2   ONETOUCH ULTRA test strip, USE TO TEST FOR BLOOD SUGAR THREE TIMES DAILY DX: E11.51, Disp: 300 strip, Rfl: 11   pantoprazole   (PROTONIX ) 40 MG tablet, TAKE ONE TABLET BY MOUTH ONCE DAILY FOR STOMACH, Disp: 90 tablet, Rfl: 3   pramipexole  (MIRAPEX ) 0.25 MG tablet, Take 5 tablets (1.25 mg total) by mouth at bedtime for 30 days, THEN 4 tablets (1 mg total) at bedtime for 30 days, THEN 3 tablets (0.75 mg total) at bedtime for 30 days, THEN 2 tablets (0.5 mg total) at bedtime for 30 days, THEN 1 tablet (0.25 mg total) at bedtime., Disp: 450 tablet, Rfl: 0   tirzepatide  (MOUNJARO ) 5 MG/0.5ML Pen, Inject 5 mg into the skin once a week., Disp: 2 mL, Rfl: 3   valsartan -hydrochlorothiazide  (DIOVAN -HCT) 160-25 MG tablet, TAKE 1 TABLET BY MOUTH EVERY DAY, Disp: 90 tablet, Rfl: 2 No current facility-administered medications for this visit.  Facility-Administered Medications Ordered in Other Visits:    [COMPLETED] hydrALAZINE  (APRESOLINE ) injection 10 mg, 10 mg, Intravenous, Once, Ladona Heinz, MD, 10 mg at 05/01/21 1054  "

## 2024-03-15 NOTE — Addendum Note (Signed)
 Addended by: OLENA LAMAR PARAS on: 03/15/2024 12:21 PM   Modules accepted: Orders

## 2024-03-16 ENCOUNTER — Other Ambulatory Visit (HOSPITAL_COMMUNITY): Payer: Self-pay

## 2024-03-17 ENCOUNTER — Ambulatory Visit (HOSPITAL_COMMUNITY)
Admission: RE | Admit: 2024-03-17 | Discharge: 2024-03-17 | Disposition: A | Discharge status: Home / Self Care | Source: Ambulatory Visit | Attending: Pulmonary Disease | Admitting: Pulmonary Disease

## 2024-03-17 VITALS — BP 147/78 | HR 107 | Resp 16

## 2024-03-17 DIAGNOSIS — D509 Iron deficiency anemia, unspecified: Secondary | ICD-10-CM | POA: Insufficient documentation

## 2024-03-17 MED ORDER — SODIUM CHLORIDE 0.9 % IV SOLN
INTRAVENOUS | Status: AC
  Filled 2024-03-17: qty 10

## 2024-03-17 MED ORDER — SODIUM CHLORIDE 0.9 % IV SOLN
1000.0000 mg | Freq: Once | INTRAVENOUS | Status: AC
  Administered 2024-03-17: 1000 mg via INTRAVENOUS

## 2024-03-22 ENCOUNTER — Other Ambulatory Visit (HOSPITAL_COMMUNITY): Payer: Self-pay

## 2024-03-22 ENCOUNTER — Telehealth: Payer: Self-pay

## 2024-03-22 NOTE — Telephone Encounter (Signed)
 This approval authorizes your coverage from 03/04/2024 - 03/22/2025, unless we notify you otherwise, and as long as the following conditions apply: ? you remain enrolled in our Medicare Part D prescription drug plan, ? your physician or other prescriber continues to prescribe the medication for you, and ? the medication continues to be safe for treating your condition.

## 2024-03-22 NOTE — Telephone Encounter (Signed)
*  Pulm  Pharmacy Patient Advocate Encounter   Received notification from Patient Advice Request messages that prior authorization for Horizant  600mg   is required/requested.   Insurance verification completed.   The patient is insured through [facility].   Per test claim: PA required; PA submitted to above mentioned insurance via Latent Key/confirmation #/EOC [id number] Status is pending

## 2024-03-25 ENCOUNTER — Encounter: Admitting: Dietician

## 2024-04-01 ENCOUNTER — Other Ambulatory Visit: Payer: Self-pay | Admitting: Internal Medicine

## 2024-04-02 ENCOUNTER — Encounter: Payer: Self-pay | Admitting: Nurse Practitioner

## 2024-04-02 ENCOUNTER — Ambulatory Visit: Payer: Self-pay | Admitting: Nurse Practitioner

## 2024-04-02 VITALS — BP 130/78 | HR 68 | Temp 97.3°F | Ht 68.0 in | Wt 201.0 lb

## 2024-04-02 DIAGNOSIS — I10 Essential (primary) hypertension: Secondary | ICD-10-CM | POA: Diagnosis not present

## 2024-04-02 DIAGNOSIS — D509 Iron deficiency anemia, unspecified: Secondary | ICD-10-CM | POA: Diagnosis not present

## 2024-04-02 DIAGNOSIS — E7800 Pure hypercholesterolemia, unspecified: Secondary | ICD-10-CM | POA: Diagnosis not present

## 2024-04-02 DIAGNOSIS — K219 Gastro-esophageal reflux disease without esophagitis: Secondary | ICD-10-CM | POA: Diagnosis not present

## 2024-04-02 DIAGNOSIS — I25118 Atherosclerotic heart disease of native coronary artery with other forms of angina pectoris: Secondary | ICD-10-CM | POA: Diagnosis not present

## 2024-04-02 DIAGNOSIS — G2581 Restless legs syndrome: Secondary | ICD-10-CM | POA: Diagnosis not present

## 2024-04-02 DIAGNOSIS — Z794 Long term (current) use of insulin: Secondary | ICD-10-CM

## 2024-04-02 DIAGNOSIS — E1165 Type 2 diabetes mellitus with hyperglycemia: Secondary | ICD-10-CM

## 2024-04-02 DIAGNOSIS — E538 Deficiency of other specified B group vitamins: Secondary | ICD-10-CM

## 2024-04-02 NOTE — Patient Instructions (Signed)
 Increase vit b12 supplement 1000 mcg by mouth daily in the MORNING.

## 2024-04-03 LAB — HEPATIC FUNCTION PANEL
AG Ratio: 2.3 (calc) (ref 1.0–2.5)
ALT: 23 U/L (ref 9–46)
AST: 20 U/L (ref 10–35)
Albumin: 4.5 g/dL (ref 3.6–5.1)
Alkaline phosphatase (APISO): 58 U/L (ref 35–144)
Bilirubin, Direct: 0.1 mg/dL (ref 0.0–0.2)
Globulin: 2 g/dL (ref 1.9–3.7)
Indirect Bilirubin: 0.4 mg/dL (ref 0.2–1.2)
Total Bilirubin: 0.5 mg/dL (ref 0.2–1.2)
Total Protein: 6.5 g/dL (ref 6.1–8.1)

## 2024-04-03 LAB — CBC WITH DIFFERENTIAL/PLATELET
Absolute Lymphocytes: 1519 {cells}/uL (ref 850–3900)
Absolute Monocytes: 612 {cells}/uL (ref 200–950)
Basophils Absolute: 22 {cells}/uL (ref 0–200)
Basophils Relative: 0.3 %
Eosinophils Absolute: 180 {cells}/uL (ref 15–500)
Eosinophils Relative: 2.5 %
HCT: 46.4 % (ref 39.4–51.1)
Hemoglobin: 15.6 g/dL (ref 13.2–17.1)
MCH: 29.8 pg (ref 27.0–33.0)
MCHC: 33.6 g/dL (ref 31.6–35.4)
MCV: 88.7 fL (ref 81.4–101.7)
MPV: 11.9 fL (ref 7.5–12.5)
Monocytes Relative: 8.5 %
Neutro Abs: 4867 {cells}/uL (ref 1500–7800)
Neutrophils Relative %: 67.6 %
Platelets: 142 10*3/uL (ref 140–400)
RBC: 5.23 Million/uL (ref 4.20–5.80)
RDW: 12.9 % (ref 11.0–15.0)
Total Lymphocyte: 21.1 %
WBC: 7.2 10*3/uL (ref 3.8–10.8)

## 2024-04-05 ENCOUNTER — Ambulatory Visit: Payer: Self-pay | Admitting: Nurse Practitioner

## 2024-04-15 ENCOUNTER — Encounter: Admitting: Dietician

## 2024-05-25 ENCOUNTER — Ambulatory Visit: Admitting: Pulmonary Disease

## 2024-06-18 ENCOUNTER — Ambulatory Visit: Admitting: Cardiology

## 2024-07-06 ENCOUNTER — Ambulatory Visit: Admitting: Internal Medicine

## 2024-07-28 ENCOUNTER — Ambulatory Visit: Admitting: Neurology

## 2024-10-08 ENCOUNTER — Ambulatory Visit: Admitting: Nurse Practitioner

## 2024-11-01 ENCOUNTER — Encounter: Payer: Self-pay | Admitting: Adult Health
